# Patient Record
Sex: Female | Born: 1945 | Race: Black or African American | Hispanic: No | Smoking: Never smoker
Health system: Southern US, Community
[De-identification: ages and names within clinical notes are randomized; demographics above are authoritative.]

## PROBLEM LIST (undated history)

## (undated) DIAGNOSIS — I779 Disorder of arteries and arterioles, unspecified: Secondary | ICD-10-CM

## (undated) DIAGNOSIS — I5042 Chronic combined systolic (congestive) and diastolic (congestive) heart failure: Secondary | ICD-10-CM

## (undated) DIAGNOSIS — M545 Low back pain, unspecified: Secondary | ICD-10-CM

## (undated) DIAGNOSIS — I739 Peripheral vascular disease, unspecified: Secondary | ICD-10-CM

## (undated) DIAGNOSIS — Z90710 Acquired absence of both cervix and uterus: Secondary | ICD-10-CM

## (undated) DIAGNOSIS — F419 Anxiety disorder, unspecified: Secondary | ICD-10-CM

## (undated) DIAGNOSIS — N3281 Overactive bladder: Secondary | ICD-10-CM

## (undated) DIAGNOSIS — I708 Atherosclerosis of other arteries: Secondary | ICD-10-CM

## (undated) DIAGNOSIS — N179 Acute kidney failure, unspecified: Secondary | ICD-10-CM

## (undated) DIAGNOSIS — K219 Gastro-esophageal reflux disease without esophagitis: Secondary | ICD-10-CM

## (undated) DIAGNOSIS — E785 Hyperlipidemia, unspecified: Secondary | ICD-10-CM

## (undated) DIAGNOSIS — E669 Obesity, unspecified: Secondary | ICD-10-CM

## (undated) DIAGNOSIS — N184 Chronic kidney disease, stage 4 (severe): Secondary | ICD-10-CM

## (undated) DIAGNOSIS — E119 Type 2 diabetes mellitus without complications: Secondary | ICD-10-CM

## (undated) DIAGNOSIS — D649 Anemia, unspecified: Secondary | ICD-10-CM

## (undated) DIAGNOSIS — I2510 Atherosclerotic heart disease of native coronary artery without angina pectoris: Secondary | ICD-10-CM

## (undated) DIAGNOSIS — I493 Ventricular premature depolarization: Secondary | ICD-10-CM

## (undated) DIAGNOSIS — M1990 Unspecified osteoarthritis, unspecified site: Secondary | ICD-10-CM

## (undated) DIAGNOSIS — I10 Essential (primary) hypertension: Secondary | ICD-10-CM

## (undated) HISTORY — DX: Anxiety disorder, unspecified: F41.9

## (undated) HISTORY — PX: OTHER SURGICAL HISTORY: SHX169

## (undated) HISTORY — PX: ABDOMINAL HYSTERECTOMY: SHX81

## (undated) HISTORY — PX: CERVICAL BIOPSY: SHX590

## (undated) HISTORY — DX: Unspecified osteoarthritis, unspecified site: M19.90

## (undated) HISTORY — PX: CARPAL TUNNEL RELEASE: SHX101

## (undated) HISTORY — DX: Gastro-esophageal reflux disease without esophagitis: K21.9

## (undated) HISTORY — DX: Acquired absence of both cervix and uterus: Z90.710

## (undated) HISTORY — DX: Overactive bladder: N32.81

## (undated) HISTORY — DX: Low back pain, unspecified: M54.50

## (undated) HISTORY — PX: TRIGGER FINGER RELEASE: SHX641

## (undated) HISTORY — PX: BACK SURGERY: SHX140

## (undated) HISTORY — DX: Hyperlipidemia, unspecified: E78.5

## (undated) HISTORY — DX: Low back pain: M54.5

## (undated) HISTORY — DX: Obesity, unspecified: E66.9

---

## 1997-12-04 ENCOUNTER — Observation Stay (HOSPITAL_COMMUNITY): Admission: RE | Admit: 1997-12-04 | Discharge: 1997-12-04 | Payer: Self-pay | Admitting: Cardiology

## 1997-12-24 ENCOUNTER — Ambulatory Visit (HOSPITAL_COMMUNITY): Admission: RE | Admit: 1997-12-24 | Discharge: 1997-12-24 | Payer: Self-pay | Admitting: Neurosurgery

## 1998-01-12 ENCOUNTER — Ambulatory Visit (HOSPITAL_COMMUNITY): Admission: RE | Admit: 1998-01-12 | Discharge: 1998-01-12 | Payer: Self-pay | Admitting: Neurosurgery

## 1998-09-25 ENCOUNTER — Ambulatory Visit (HOSPITAL_COMMUNITY): Admission: RE | Admit: 1998-09-25 | Discharge: 1998-09-25 | Payer: Self-pay | Admitting: Neurosurgery

## 1998-10-08 ENCOUNTER — Inpatient Hospital Stay (HOSPITAL_COMMUNITY): Admission: RE | Admit: 1998-10-08 | Discharge: 1998-10-09 | Payer: Self-pay | Admitting: Neurosurgery

## 1999-08-21 ENCOUNTER — Encounter: Payer: Self-pay | Admitting: Family Medicine

## 1999-08-21 ENCOUNTER — Encounter: Admission: RE | Admit: 1999-08-21 | Discharge: 1999-08-21 | Payer: Self-pay | Admitting: Family Medicine

## 2000-07-05 HISTORY — PX: COLONOSCOPY: SHX174

## 2000-07-19 ENCOUNTER — Ambulatory Visit (HOSPITAL_COMMUNITY): Admission: RE | Admit: 2000-07-19 | Discharge: 2000-07-19 | Payer: Self-pay | Admitting: General Surgery

## 2001-09-05 ENCOUNTER — Ambulatory Visit (HOSPITAL_COMMUNITY): Admission: RE | Admit: 2001-09-05 | Discharge: 2001-09-05 | Payer: Self-pay | Admitting: Family Medicine

## 2001-09-05 ENCOUNTER — Encounter: Payer: Self-pay | Admitting: Family Medicine

## 2002-03-05 ENCOUNTER — Encounter: Payer: Self-pay | Admitting: Internal Medicine

## 2002-03-05 ENCOUNTER — Ambulatory Visit (HOSPITAL_COMMUNITY): Admission: RE | Admit: 2002-03-05 | Discharge: 2002-03-05 | Payer: Self-pay | Admitting: Internal Medicine

## 2002-07-17 ENCOUNTER — Ambulatory Visit (HOSPITAL_COMMUNITY): Admission: RE | Admit: 2002-07-17 | Discharge: 2002-07-17 | Payer: Self-pay | Admitting: Family Medicine

## 2002-07-17 ENCOUNTER — Encounter: Payer: Self-pay | Admitting: Family Medicine

## 2002-10-25 ENCOUNTER — Encounter: Payer: Self-pay | Admitting: Family Medicine

## 2002-10-25 ENCOUNTER — Ambulatory Visit (HOSPITAL_COMMUNITY): Admission: RE | Admit: 2002-10-25 | Discharge: 2002-10-25 | Payer: Self-pay | Admitting: Family Medicine

## 2002-12-23 ENCOUNTER — Encounter: Payer: Self-pay | Admitting: Family Medicine

## 2002-12-23 ENCOUNTER — Ambulatory Visit (HOSPITAL_COMMUNITY): Admission: RE | Admit: 2002-12-23 | Discharge: 2002-12-23 | Payer: Self-pay | Admitting: Family Medicine

## 2003-06-23 ENCOUNTER — Ambulatory Visit (HOSPITAL_COMMUNITY): Admission: RE | Admit: 2003-06-23 | Discharge: 2003-06-23 | Payer: Self-pay | Admitting: Urology

## 2003-07-17 ENCOUNTER — Ambulatory Visit (HOSPITAL_COMMUNITY): Admission: RE | Admit: 2003-07-17 | Discharge: 2003-07-17 | Payer: Self-pay | Admitting: General Surgery

## 2003-11-06 ENCOUNTER — Ambulatory Visit (HOSPITAL_COMMUNITY): Admission: RE | Admit: 2003-11-06 | Discharge: 2003-11-06 | Payer: Self-pay | Admitting: Family Medicine

## 2004-02-25 ENCOUNTER — Ambulatory Visit: Payer: Self-pay | Admitting: Cardiology

## 2004-03-04 ENCOUNTER — Ambulatory Visit (HOSPITAL_COMMUNITY): Admission: RE | Admit: 2004-03-04 | Discharge: 2004-03-04 | Payer: Self-pay | Admitting: Family Medicine

## 2004-03-25 ENCOUNTER — Ambulatory Visit: Payer: Self-pay | Admitting: Orthopedic Surgery

## 2004-05-09 ENCOUNTER — Inpatient Hospital Stay (HOSPITAL_COMMUNITY): Admission: EM | Admit: 2004-05-09 | Discharge: 2004-05-11 | Payer: Self-pay | Admitting: Emergency Medicine

## 2004-05-10 ENCOUNTER — Ambulatory Visit: Payer: Self-pay | Admitting: *Deleted

## 2004-05-12 ENCOUNTER — Ambulatory Visit: Payer: Self-pay | Admitting: Cardiology

## 2004-12-16 ENCOUNTER — Ambulatory Visit: Payer: Self-pay | Admitting: Internal Medicine

## 2004-12-31 ENCOUNTER — Ambulatory Visit (HOSPITAL_COMMUNITY): Admission: RE | Admit: 2004-12-31 | Discharge: 2004-12-31 | Payer: Self-pay | Admitting: Family Medicine

## 2005-02-04 ENCOUNTER — Ambulatory Visit: Payer: Self-pay | Admitting: Cardiology

## 2005-03-24 ENCOUNTER — Emergency Department (HOSPITAL_COMMUNITY): Admission: EM | Admit: 2005-03-24 | Discharge: 2005-03-24 | Payer: Self-pay | Admitting: Emergency Medicine

## 2005-06-17 ENCOUNTER — Encounter: Admission: RE | Admit: 2005-06-17 | Discharge: 2005-06-17 | Payer: Self-pay | Admitting: Family Medicine

## 2005-10-04 ENCOUNTER — Ambulatory Visit: Payer: Self-pay | Admitting: Internal Medicine

## 2005-10-07 ENCOUNTER — Ambulatory Visit (HOSPITAL_COMMUNITY): Admission: RE | Admit: 2005-10-07 | Discharge: 2005-10-07 | Payer: Self-pay | Admitting: Urology

## 2005-12-01 ENCOUNTER — Ambulatory Visit (HOSPITAL_COMMUNITY): Admission: RE | Admit: 2005-12-01 | Discharge: 2005-12-01 | Payer: Self-pay | Admitting: Urology

## 2006-01-02 ENCOUNTER — Ambulatory Visit (HOSPITAL_COMMUNITY): Admission: RE | Admit: 2006-01-02 | Discharge: 2006-01-02 | Payer: Self-pay | Admitting: Family Medicine

## 2006-03-07 HISTORY — PX: COLONOSCOPY: SHX174

## 2006-03-08 ENCOUNTER — Ambulatory Visit: Payer: Self-pay | Admitting: Cardiology

## 2006-03-16 ENCOUNTER — Ambulatory Visit: Payer: Self-pay

## 2006-03-31 ENCOUNTER — Ambulatory Visit: Payer: Self-pay

## 2006-03-31 LAB — CONVERTED CEMR LAB
BUN: 15 mg/dL (ref 6–23)
Creatinine, Ser: 1 mg/dL (ref 0.4–1.2)
GFR calc Af Amer: 72 mL/min
GFR calc non Af Amer: 60 mL/min
HCT: 35.8 % — ABNORMAL LOW (ref 36.0–46.0)
INR: 0.9 (ref 0.9–2.0)
MCHC: 32.7 g/dL (ref 30.0–36.0)
MCV: 100.8 fL — ABNORMAL HIGH (ref 78.0–100.0)
RBC: 3.55 M/uL — ABNORMAL LOW (ref 3.87–5.11)
RDW: 13 % (ref 11.5–14.6)
Sodium: 140 meq/L (ref 135–145)
WBC: 5.1 10*3/uL (ref 4.5–10.5)
aPTT: 30.1 s (ref 26.5–36.5)

## 2006-04-01 ENCOUNTER — Encounter: Admission: RE | Admit: 2006-04-01 | Discharge: 2006-04-01 | Payer: Self-pay | Admitting: Family Medicine

## 2006-04-04 ENCOUNTER — Ambulatory Visit: Payer: Self-pay | Admitting: Cardiology

## 2006-04-04 ENCOUNTER — Inpatient Hospital Stay (HOSPITAL_BASED_OUTPATIENT_CLINIC_OR_DEPARTMENT_OTHER): Admission: RE | Admit: 2006-04-04 | Discharge: 2006-04-04 | Payer: Self-pay | Admitting: Cardiology

## 2006-04-04 HISTORY — PX: CARDIAC CATHETERIZATION: SHX172

## 2006-04-11 ENCOUNTER — Ambulatory Visit (HOSPITAL_COMMUNITY): Admission: RE | Admit: 2006-04-11 | Discharge: 2006-04-12 | Payer: Self-pay | Admitting: Cardiology

## 2006-04-11 ENCOUNTER — Encounter: Payer: Self-pay | Admitting: Internal Medicine

## 2006-04-11 ENCOUNTER — Ambulatory Visit: Payer: Self-pay | Admitting: Cardiology

## 2006-04-11 DIAGNOSIS — K5900 Constipation, unspecified: Secondary | ICD-10-CM

## 2006-04-11 DIAGNOSIS — M1990 Unspecified osteoarthritis, unspecified site: Secondary | ICD-10-CM | POA: Insufficient documentation

## 2006-04-11 DIAGNOSIS — N318 Other neuromuscular dysfunction of bladder: Secondary | ICD-10-CM | POA: Insufficient documentation

## 2006-04-11 DIAGNOSIS — K219 Gastro-esophageal reflux disease without esophagitis: Secondary | ICD-10-CM | POA: Insufficient documentation

## 2006-04-11 DIAGNOSIS — E119 Type 2 diabetes mellitus without complications: Secondary | ICD-10-CM

## 2006-04-11 DIAGNOSIS — I10 Essential (primary) hypertension: Secondary | ICD-10-CM

## 2006-04-11 DIAGNOSIS — J309 Allergic rhinitis, unspecified: Secondary | ICD-10-CM | POA: Insufficient documentation

## 2006-04-11 DIAGNOSIS — G5600 Carpal tunnel syndrome, unspecified upper limb: Secondary | ICD-10-CM | POA: Insufficient documentation

## 2006-04-11 DIAGNOSIS — F411 Generalized anxiety disorder: Secondary | ICD-10-CM

## 2006-04-11 DIAGNOSIS — M545 Low back pain: Secondary | ICD-10-CM

## 2006-04-11 DIAGNOSIS — E785 Hyperlipidemia, unspecified: Secondary | ICD-10-CM

## 2006-04-11 HISTORY — PX: CORONARY STENT PLACEMENT: SHX1402

## 2006-04-25 ENCOUNTER — Ambulatory Visit: Payer: Self-pay | Admitting: Cardiology

## 2006-06-20 ENCOUNTER — Ambulatory Visit: Payer: Self-pay | Admitting: Cardiology

## 2006-10-06 ENCOUNTER — Ambulatory Visit: Payer: Self-pay | Admitting: Cardiology

## 2006-10-12 ENCOUNTER — Ambulatory Visit: Payer: Self-pay

## 2006-11-29 ENCOUNTER — Ambulatory Visit: Payer: Self-pay | Admitting: Orthopedic Surgery

## 2007-01-11 ENCOUNTER — Ambulatory Visit (HOSPITAL_COMMUNITY): Admission: RE | Admit: 2007-01-11 | Discharge: 2007-01-11 | Payer: Self-pay | Admitting: Family Medicine

## 2007-01-24 ENCOUNTER — Encounter: Payer: Self-pay | Admitting: Orthopedic Surgery

## 2007-02-05 ENCOUNTER — Ambulatory Visit: Payer: Self-pay | Admitting: Orthopedic Surgery

## 2007-02-05 DIAGNOSIS — M25519 Pain in unspecified shoulder: Secondary | ICD-10-CM | POA: Insufficient documentation

## 2007-04-12 ENCOUNTER — Ambulatory Visit: Payer: Self-pay | Admitting: Cardiology

## 2007-08-23 ENCOUNTER — Emergency Department (HOSPITAL_COMMUNITY): Admission: EM | Admit: 2007-08-23 | Discharge: 2007-08-23 | Payer: Self-pay | Admitting: Emergency Medicine

## 2007-09-19 ENCOUNTER — Ambulatory Visit: Payer: Self-pay | Admitting: Cardiology

## 2007-09-21 ENCOUNTER — Ambulatory Visit (HOSPITAL_COMMUNITY): Admission: RE | Admit: 2007-09-21 | Discharge: 2007-09-21 | Payer: Self-pay | Admitting: Cardiology

## 2007-09-21 ENCOUNTER — Ambulatory Visit: Payer: Self-pay | Admitting: Internal Medicine

## 2007-09-21 ENCOUNTER — Encounter: Payer: Self-pay | Admitting: Cardiology

## 2008-04-10 ENCOUNTER — Ambulatory Visit: Payer: Self-pay | Admitting: Cardiology

## 2008-06-14 DIAGNOSIS — R011 Cardiac murmur, unspecified: Secondary | ICD-10-CM

## 2008-10-08 ENCOUNTER — Ambulatory Visit: Payer: Self-pay | Admitting: Cardiology

## 2008-10-08 DIAGNOSIS — R079 Chest pain, unspecified: Secondary | ICD-10-CM | POA: Insufficient documentation

## 2008-10-08 DIAGNOSIS — I2510 Atherosclerotic heart disease of native coronary artery without angina pectoris: Secondary | ICD-10-CM | POA: Insufficient documentation

## 2008-10-13 ENCOUNTER — Telehealth (INDEPENDENT_AMBULATORY_CARE_PROVIDER_SITE_OTHER): Payer: Self-pay | Admitting: *Deleted

## 2008-10-14 ENCOUNTER — Encounter: Payer: Self-pay | Admitting: Cardiology

## 2008-10-14 ENCOUNTER — Ambulatory Visit: Payer: Self-pay

## 2009-07-20 ENCOUNTER — Ambulatory Visit: Payer: Self-pay | Admitting: Cardiology

## 2009-07-20 DIAGNOSIS — E663 Overweight: Secondary | ICD-10-CM

## 2009-08-14 ENCOUNTER — Telehealth (INDEPENDENT_AMBULATORY_CARE_PROVIDER_SITE_OTHER): Payer: Self-pay | Admitting: *Deleted

## 2009-10-08 ENCOUNTER — Telehealth: Payer: Self-pay | Admitting: Cardiology

## 2009-11-05 ENCOUNTER — Telehealth: Payer: Self-pay | Admitting: Cardiology

## 2009-11-30 ENCOUNTER — Telehealth: Payer: Self-pay | Admitting: Cardiology

## 2010-01-07 ENCOUNTER — Ambulatory Visit: Payer: Self-pay | Admitting: Cardiology

## 2010-01-07 ENCOUNTER — Encounter: Payer: Self-pay | Admitting: Cardiology

## 2010-02-22 ENCOUNTER — Telehealth: Payer: Self-pay | Admitting: Cardiology

## 2010-03-28 ENCOUNTER — Encounter: Payer: Self-pay | Admitting: Family Medicine

## 2010-04-08 NOTE — Assessment & Plan Note (Signed)
Summary: 6 mo f/u ./cy   Visit Type:  Follow-up Primary Provider:  Mirna Mires  CC:  no complaints.  History of Present Illness: [name] returns today for evaluation and management of her coronary disease. She is having no chest pain or angina. She denies any orthopnea, PND or edema. Her weight has been stable. She is following with Dr. Loleta Chance and check her blood work etc. She is having a hard time affording Plavix. Tolerate those generic next year.  Current Medications (verified): 1)  Apidra 100 Unit/ml Soln (Insulin Glulisine) .... Use As Directed 2)  Lantus 100 Unit/ml Soln (Insulin Glargine) .... Use As Directed 3)  Demadex 20 Mg Tabs (Torsemide) .... Two Times A Day 4)  Metformin Hcl 1000 Mg Tabs (Metformin Hcl) .Marland Kitchen.. 1 Tab Two Times A Day 5)  Calan Sr 240 Mg Tbcr (Verapamil Hcl) .... Two Times A Day 6)  Reglan 10 Mg Tabs (Metoclopramide Hcl) .... Once Daily As Needed 7)  Aspirin 325 Mg Tabs (Aspirin) .... Once Daily 8)  Crestor 20 Mg Tabs (Rosuvastatin Calcium) .Marland Kitchen.. 1 Tab At Bedtime 9)  Tribenzor 40-10-25 Mg Tabs (Olmesartan-Amlodipine-Hctz) .Marland Kitchen.. 1 Tab Once Daily 10)  Alprazolam 0.5 Mg Tabs (Alprazolam) .Marland Kitchen.. 1 Tab At Bedtime As Needed 11)  Omega Q Plus .... 2 Softgels Once Daily 12)  Plavix 75 Mg Tabs (Clopidogrel Bisulfate) .Marland Kitchen.. 1 Tab Once Daily 13)  Oxybutynin Chloride 5 Mg Tabs (Oxybutynin Chloride) .Marland Kitchen.. 1 Tab Once Daily  Allergies: 1)  ! * Cdn 2)  ! 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 Cervical lymph node biopsies. Multiple surgeries to her back, involving intravertebral discs. two Taxus stents to the circumflex placed in February 2008  Family History: Last updated: 06/14/2008 Family History of Cancer:  Family History of CVA or Stroke:  Family History of Diabetes:  Family History of Hypertension:   Social History: Last updated: 06/14/2008 Married  Tobacco Use - No.  Alcohol Use - no  Risk Factors: Smoking Status: never (06/14/2008)  Clinical Reports Reviewed:  Cardiac Cath:  04/11/2006: Cardiac Cath Findings:  RESULTS:  Initially stenosis in the mid-circumflex artery was estimated  at 95% and extended over a long area in the end of the proximal and the  mid-vessel and into the mid to distal vessel.  Following stenting, this  improved to 0%.   CONCLUSION:  Successful PCI of a long, diffusely diseased segment in the  mid-circumflex artery using two overlapping Taxus drug-eluting stents  with improvement in the stenosis narrowing from 95% to 0%.   DISPOSITION:  The patient to return to the __________  for further  observation.  Bruce Elvera Lennox Juanda Chance, MD, [facility]  04/04/2006: Cardiac Cath Findings:    CONCLUSION:  Coronary artery disease with diffuse disease in the left  anterior descending with 40% proximal, 70% mid and 80% distal stenosis  and 80% stenosis in the first diagonal branch, 90 and 80% stenosis in  the proximal to mid circumflex artery and 40% narrowing in the mid right  coronary was normal left ventricular function.   RECOMMENDATIONS:  The patient is asymptomatic but has an abnormal  Myoview scan.  The  LAD lesions do not appear to be critical and the  distal lesions appear to be more pronounced than the proximal lesions.  The circumflex lesion is quite tight.  I think the best option would be  intervention on the circumflex artery.  I discussed  this with Dr. Daleen Squibb.  The LAD has diffuse disease  but not critically  diseased and the right coronary artery is free of major obstruction.  Will discuss these options with the patient and plan intervention later  this week or next week if she is agreeable.   Bruce Elvera Lennox Juanda Chance, MD, [facility]  Nuclear Study:  10/14/2008:  Excerise capacity: Adenosine study with no exercise  ECG impression: Baseline: NSR with diffuse ST-T wave abnormalities; No significant ST change with adenosine  Overall impression: Low risk nuclear study.  Overall Impression Comments: Threre is decreased perfusion in the anterolateral Taber Sweetser on both the rest and stress images which is mildly worse on the stress images. I suspect this breast attenuation but can't exclude previous infarct with mild ischemia.  Arvilla Meres, MD, [facility]   10/12/2006:  Exercise capacity -  Blood Pressure -   Clinical Symptoms - Mild chest pain/ dyspnea  ECG impression - nondiagnostic due to baseline changes  Overall impression -  Probable normal perfusion and mild soft tissue attenuation (breast)  03/16/2006:  Exercise capacity - Adenosine study with no exercise  Blood Pressure - Normal BP response  Clinical Symptoms - chest tight  ECG impression - No significant ST segment change suggestive of ischemia  Overall impression -  Abnormal stress nuclear study. There is mild/moderate ischemia in the anterior Cleavon Goldman. This may be somewhat more marked than 2005   Review of Systems       negative other than history of present illness  Vital Signs:  Patient profile:   65 year old female Height:      70 inches Weight:      206 pounds Pulse rate:   72 / minute Pulse rhythm:   regular BP sitting:   150 / 70  (right arm)  Vitals Entered By: [name], CMA (January 07, 2010 9:57 AM)  Physical Exam  General:  obese.  no acute distress Head:  normocephalic and atraumatic Eyes:  PERRLA/EOM intact; conjunctiva and lids normal. Neck:  Neck supple, no JVD. No masses, thyromegaly or  abnormal cervical nodes. Chest Azir Muzyka:  no deformities or breast masses noted Lungs:  Clear bilaterally to auscultation and percussion. Heart:  PMI heart to appreciate, normal S1-S2, soft systolic murmur, no carotid bruit Msk:  decreased ROM.   Pulses:  pulses normal in all 4 extremities Extremities:  No clubbing or cyanosis. Neurologic:  Alert and oriented x 3. Skin:  Intact without lesions or rashes. Psych:  Normal affect.   Impression & Recommendations:  Problem # 1:  CORONARY ATHEROSCLEROSIS NATIVE CORONARY ARTERY (ICD-414.01) Assessment Unchanged  Her updated medication list for this problem includes:    Calan Sr 240 Mg Tbcr (Verapamil hcl) .Marland Kitchen..Marland Kitchen Two times a day    Aspirin 325 Mg Tabs (Aspirin) ..... Once daily    Tribenzor 40-10-25 Mg Tabs (Olmesartan-amlodipine-hctz) .Marland Kitchen... 1 tab once daily    Plavix 75 Mg Tabs (Clopidogrel bisulfate) .Marland Kitchen... 1 tab once daily  Problem # 2:  OVERWEIGHT/OBESITY (ICD-278.02) Assessment: Unchanged  Problem # 3:  HEART MURMUR, SYSTOLIC (ICD-785.2) Assessment: Unchanged  Her updated medication list for this problem includes:    Demadex 20 Mg Tabs (Torsemide) .Marland Kitchen..Marland Kitchen Two times a day  Tribenzor 40-10-25 Mg Tabs (Olmesartan-amlodipine-hctz) .Marland Kitchen... 1 tab once daily  Orders: EKG w/ Interpretation (93000)  Problem # 4:  HYPERTENSION (ICD-401.9) Assessment: Unchanged  Her updated medication list for this problem includes:    Demadex 20 Mg Tabs (Torsemide) .Marland Kitchen..Marland Kitchen Two times a day    Calan Sr 240 Mg Tbcr (Verapamil hcl) .Marland Kitchen..Marland Kitchen Two times a day    Aspirin 325 Mg Tabs (Aspirin) ..... Once daily    Tribenzor 40-10-25 Mg Tabs (Olmesartan-amlodipine-hctz) .Marland Kitchen... 1 tab once daily  Problem # 5:  HYPERLIPIDEMIA (ICD-272.4) followed by primary care Her updated medication list for this problem includes:    Crestor 20 Mg Tabs (Rosuvastatin calcium) .Marland Kitchen... 1 tab at bedtime  Problem # 6:  DIABETES MELLITUS, TYPE II (ICD-250.00) followed by primary care The  following medications were removed from the medication list:    Actos 15 Mg Tabs (Pioglitazone hcl) .Marland Kitchen... 1 tab once daily    Byetta 5 Mcg Pen 5 Mcg/0.67ml Soln (Exenatide) .Marland Kitchen... Take as directed Her updated medication list for this problem includes:    Apidra 100 Unit/ml Soln (Insulin glulisine) ..... Use as directed    Lantus 100 Unit/ml Soln (Insulin glargine) ..... Use as directed    Metformin Hcl 1000 Mg Tabs (Metformin hcl) .Marland Kitchen... 1 tab two times a day    Aspirin 325 Mg Tabs (Aspirin) ..... Once daily    Tribenzor 40-10-25 Mg Tabs (Olmesartan-amlodipine-hctz) .Marland Kitchen... 1 tab once daily  Patient Instructions: 1)  Your physician recommends that you schedule a follow-up appointment in: 1 year with DR. Yancy Hascall 2)  Your physician recommends that you continue on your current medications as directed. Please refer to the Current Medication list given to you today.

## 2010-04-08 NOTE — Progress Notes (Signed)
Summary: Samples of Plavix  Phone Note Call from Patient Call back at Home Phone [phone]   Caller: Patient Summary of Call: Pt need samples of Plavix Initial call taken by: Judie Grieve,  October 08, 2009 1:45 PM  Follow-up for Phone Call        Samples pulled and placed at front desk. I have left a message for the pt to call. Plavix 75mg  [id number]  exp: 9/12 # 8. Sherri Rad, RN, BSN  October 08, 2009 2:05 PM   The pt. is aware. Follow-up by: Sherri Rad, RN, BSN,  October 08, 2009 4:16 PM

## 2010-04-08 NOTE — Progress Notes (Signed)
Summary: pt needs samples of plavix  Phone Note Refill Request Call back at Home Phone [phone] Call back at [phone] Message from:  Patient on pt needs samples  Refills Requested: Medication #1:  PLAVIX 75 MG TABS 1 tab once daily pt needs samples of Plavix  Initial call taken by: Omer Jack,  November 05, 2009 10:56 AM  Follow-up for Phone Call        CMA s/w family member of pt and advised Plavix samples are at the front desk and office is open M-F 8-5 however this Monday we will be closed due to the holiday. Family member gave verbal understanding. Danielle Rankin, CMA  November 05, 2009 12:52 PM

## 2010-04-08 NOTE — Progress Notes (Signed)
Summary: plavix samples  Phone Note Call from Patient   Caller: Patient Call For: samples Details for Reason: wants samples Summary of Call: pt called from parking lot wanting samples of Plavix.  75 mg tablet #8 Lot [id number]  exp 9/12  given to pt Initial call taken by: Charolotte Capuchin, RN,  August 14, 2009 1:31 PM

## 2010-04-08 NOTE — Progress Notes (Signed)
Summary: Need samples of Plavix  Phone Note Call from Patient Call back at [phone]   Caller: Patient Summary of Call: Pt need samples of Plavix Initial call taken by: Judie Grieve,  February 22, 2010 12:20 PM  Follow-up for Phone Call        Phone Call Completed PT AWARE NO PLAVIX SAMPLES AVAILABLE. Follow-up by: Scherrie Bateman, LPN,  February 22, 2010 3:09 PM

## 2010-04-08 NOTE — Assessment & Plan Note (Signed)
Summary: f64m  Medications Added REGLAN 10 MG TABS (METOCLOPRAMIDE HCL) once daily as needed TRIBENZOR 40-10-25 MG TABS (OLMESARTAN-AMLODIPINE-HCTZ) 1 tab once daily ACTOS 15 MG TABS (PIOGLITAZONE HCL) 1 tab once daily BYETTA 5 MCG PEN 5 MCG/0.02ML SOLN (EXENATIDE) take as directed OXYBUTYNIN CHLORIDE 5 MG TABS (OXYBUTYNIN CHLORIDE) 1 tab once daily        Visit Type:  6 mo f/u Primary Provider:  Mirna Mires  CC:  pt states she stays tired all the time....pt weight is up 10 lb since 10/2008.  History of Present Illness: [name] comes in today for followup of her coronary artery disease.  She was recently switched to Tribenzor which is really helped her pressure. He's also obtaining it for $45 a month.  Her blood pressure she says always checks well in Dr. Adaline Sill office. She is always low higher office it is 158 systolic today.  She is having no angina. She has occasional palpitations. She seems to be very compliant with her medications.  Recent stress nuclear study was stable in August of 2010. She has normal left ventricular systolic function. Please refer to the report.  Current Medications (verified): 1)  Apidra 100 Unit/ml Soln (Insulin Glulisine) .... Use As Directed 2)  Lantus 100 Unit/ml Soln (Insulin Glargine) .... Use As Directed 3)  Demadex 20 Mg Tabs (Torsemide) .... Two Times A Day 4)  Metformin Hcl 1000 Mg Tabs (Metformin Hcl) .Marland Kitchen.. 1 Tab Two Times A Day 5)  Calan Sr 240 Mg Tbcr (Verapamil Hcl) .... Two Times A Day 6)  Reglan 10 Mg Tabs (Metoclopramide Hcl) .... Once Daily As Needed 7)  Aspirin 325 Mg Tabs (Aspirin) .... Once Daily 8)  Crestor 20 Mg Tabs (Rosuvastatin Calcium) .Marland Kitchen.. 1 Tab At Bedtime 9)  Tribenzor 40-10-25 Mg Tabs (Olmesartan-Amlodipine-Hctz) .Marland Kitchen.. 1 Tab Once Daily 10)  Alprazolam 0.5 Mg Tabs (Alprazolam) .Marland Kitchen.. 1 Tab At Bedtime As Needed 11)  Omega Q Plus .... 2 Softgels Once Daily 12)  Plavix 75 Mg Tabs (Clopidogrel Bisulfate) .Marland Kitchen.. 1 Tab Once  Daily 13)  Actos 15 Mg Tabs (Pioglitazone Hcl) .Marland Kitchen.. 1 Tab Once Daily 14)  Byetta 5 Mcg Pen 5 Mcg/0.75ml Soln (Exenatide) .... Take As Directed 15)  Oxybutynin Chloride 5 Mg Tabs (Oxybutynin Chloride) .Marland Kitchen.. 1 Tab Once Daily  Allergies: 1)  ! * Cdn 2)  ! 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 Cervical lymph node biopsies. Multiple surgeries to her back, involving intravertebral discs. two Taxus stents to the circumflex placed in February 2008  Family History: Last updated: 06/14/2008 Family History of Cancer:  Family History of CVA or Stroke:  Family History of Diabetes:  Family History of Hypertension:   Social History: Last updated: 06/14/2008 Married  Tobacco Use - No.  Alcohol Use - no  Risk Factors: Smoking Status: never (06/14/2008)  Review of Systems       negative other than history of present illness  Vital Signs:  Patient profile:   65 year old female Height:      71 inches Weight:      205 pounds BMI:     28.70 Pulse rate:   72 /  minute Pulse rhythm:   regular BP sitting:   154 / 80  (left arm) Cuff size:   large  Vitals Entered By: Danielle Rankin, CMA (Jul 20, 2009 10:29 AM)  Physical Exam  General:  obese.   Head:  normocephalic and atraumatic Eyes:  PERRLA/EOM intact; conjunctiva and lids normal. Neck:  Neck supple, no JVD. No masses, thyromegaly or abnormal cervical nodes. Lungs:  Clear bilaterally to auscultation and  percussion. Heart:  Non-displaced PMI, chest non-tender; regular rate and rhythm, S1, S2 without murmurs, rubs or gallops. Carotid upstroke normal, no bruit. Normal abdominal aortic size, no bruits. Femorals normal pulses, no bruits. Pedals normal pulses. No edema, no varicosities. Msk:  Back normal, normal gait. Muscle strength and tone normal. Pulses:  pulses normal in all 4 extremities Extremities:  No clubbing or cyanosis. Neurologic:  Alert and oriented x 3. Skin:  Intact without lesions or rashes. Psych:  Normal affect.   Problems:  Medical Problems Added: 1)  Dx of Overweight/obesity  (ICD-278.02)  Impression & Recommendations:  Problem # 1:  CORONARY ATHEROSCLEROSIS NATIVE CORONARY ARTERY (ICD-414.01)  Her updated medication list for this problem includes:    Calan Sr 240 Mg Tbcr (Verapamil hcl) .Marland Kitchen..Marland Kitchen Two times a day    Aspirin 325 Mg Tabs (Aspirin) ..... Once daily    Tribenzor 40-10-25 Mg Tabs (Olmesartan-amlodipine-hctz) .Marland Kitchen... 1 tab once daily    Plavix 75 Mg Tabs (Clopidogrel bisulfate) .Marland Kitchen... 1 tab once daily  Problem # 2:  CHEST PAIN UNSPECIFIED (ICD-786.50) Assessment: Improved  Her updated medication list for this problem includes:    Calan Sr 240 Mg Tbcr (Verapamil hcl) .Marland Kitchen..Marland Kitchen Two times a day    Aspirin 325 Mg Tabs (Aspirin) ..... Once daily    Tribenzor 40-10-25 Mg Tabs (Olmesartan-amlodipine-hctz) .Marland Kitchen... 1 tab once daily    Plavix 75 Mg Tabs (Clopidogrel bisulfate) .Marland Kitchen... 1 tab once daily  Problem # 3:  HEART MURMUR, SYSTOLIC (ICD-785.2) Assessment: Unchanged  Her updated medication list for this problem includes:    Demadex 20 Mg Tabs (Torsemide) .Marland Kitchen..Marland Kitchen Two times a day    Tribenzor 40-10-25 Mg Tabs (Olmesartan-amlodipine-hctz) .Marland Kitchen... 1 tab once daily  Problem # 4:  HYPERTENSION (ICD-401.9) Assessment: Unchanged  Her updated medication list for this problem includes:    Demadex 20 Mg Tabs (Torsemide) .Marland Kitchen..Marland Kitchen Two times a day    Calan Sr 240 Mg Tbcr (Verapamil  hcl) .Marland Kitchen..Marland Kitchen Two times a day    Aspirin 325 Mg Tabs (Aspirin) ..... Once daily    Tribenzor 40-10-25 Mg Tabs (Olmesartan-amlodipine-hctz) .Marland Kitchen... 1 tab once daily  Problem # 5:  HYPERLIPIDEMIA (ICD-272.4)  Her updated medication list for this problem includes:    Crestor 20 Mg Tabs (Rosuvastatin calcium) .Marland Kitchen... 1 tab at bedtime  Problem # 6:  DIABETES MELLITUS, TYPE II (ICD-250.00)  Her updated medication list for this problem includes:    Apidra 100 Unit/ml Soln (Insulin glulisine) ..... Use as directed    Lantus 100 Unit/ml Soln (Insulin glargine) ..... Use as directed    Metformin Hcl 1000 Mg Tabs (Metformin hcl) .Marland Kitchen... 1 tab two times a day    Aspirin 325 Mg Tabs (Aspirin) ..... Once daily    Tribenzor 40-10-25 Mg Tabs (Olmesartan-amlodipine-hctz) .Marland Kitchen... 1 tab once daily    Actos 15 Mg Tabs (Pioglitazone hcl) .Marland Kitchen... 1 tab once daily    Byetta 5 Mcg Pen 5 Mcg/0.74ml Soln (Exenatide) .Marland Kitchen... Take as directed  Problem # 7:  OVERWEIGHT/OBESITY (ICD-278.02) Assessment: Unchanged  Patient Instructions: 1)  Your physician recommends that you schedule a follow-up appointment in: 6 MONTHS  WITH DR Vergene Marland 2)  Your physician recommends that you continue on your current medications as directed. Please refer to the Current Medication list given to you today.

## 2010-04-08 NOTE — Progress Notes (Signed)
Summary: samples plavix front desk..pt aware  Phone Note Call from Patient Call back at Home Phone [phone]   Caller: Patient Reason for Call: Talk to Nurse Summary of Call: sample of plavix.  Initial call taken by: Lorne Skeens,  November 30, 2009 2:19 PM  Follow-up for Phone Call        CMA s/w pt and advised that 2 boxes of plavix would be at the front desk for her. When asked if she needed a new rx called in she stated no that she had a prescription already. Danielle Rankin, CMA  November 30, 2009 3:03 PM

## 2010-07-20 NOTE — Assessment & Plan Note (Signed)
[facility]                            CARDIOLOGY OFFICE NOTE   NAME:[name], [name]                   MRN:          [id number]  DATE:10/06/2006                            DOB:          Jul 19, 1945    [name] returns today for further management of her coronary artery  disease.  She is status post 2 TAXUS stents to the circumflex in  February of 2008 because of a positive stress Myoview showing anterior  lateral wall ischemia.  She was totally asymptomatic.  She is diabetic.   She continues to have chronic fatigue.  Other than that, she is doing  well.   CURRENT MEDICATIONS:  Unchanged since her last visit, except that she is  now on Lescol 80 mg 1 nightly instead of Lipitor.  Dr. Loleta Chance is following  her blood work as well as her diabetes.   EXAM:  Blood pressure is 160/70, pulse 63 and regular, weight 203, down  9 pounds.  HEENT:  Normocephalic, atraumatic.  PERRLA.  Extraocular movements are  intact.  Sclerae clear.  Facial symmetry is normal.  Carotids are full.  There is no JVD.  Thyroid is not enlarged.  Trachea  is midline.  LUNGS:  Clear.  HEART:  Reveals a poorly appreciated PMI.  Normal S1, S2.  ABDOMEN:  Soft with good bowel sounds.  Obesity precludes accurate  assessment of organomegaly.  EXTREMITIES:  No cyanosis, clubbing, or edema.  Pulses are intact.  NEURO:  Intact.   ASSESSMENT AND PLAN:  [name] is doing well.  We will arrange for an  adenosine Myoview to follow up on her silent ischemia status post TAXUS  stents to the circumflex.  She has small vessel disease otherwise.   Assuming this is stable, we will see her back in 6 months.     Thomas C. Daleen Squibb, MD, [facility]  Electronically Signed    TCW/MedQ  DD: 10/06/2006  DT: 10/06/2006  Job #: [id number]   cc:   Evlyn Courier, MD

## 2010-07-20 NOTE — Assessment & Plan Note (Signed)
[facility]                            CARDIOLOGY OFFICE NOTE   NAME:[name], [name]                   MRN:          [id number]  DATE:09/19/2007                            DOB:          19-Aug-1945    [name] comes in today because of a heart murmur.  Dr. Loleta Chance saw her  recently and heard this.   She has coronary artery disease and has had 2 Taxus stent from the  circumflex, placed in February 2008.  She had a followup stress Myoview  in October 12, 2006, which showed no ischemia with normal left ventricular  systolic function.   She is having no angina.   She struggles with her blood pressure and her weight.   I have really not heard a murmur in the past per my notes.   Her medications include,  1. Aspirin 325 mg a day.  2. Plavix 75 mg a day.  3. Verapamil 240 mg p.o. b.i.d.  4. Allegra 180 mg p.o. daily.  5. Exforge 160/5 daily.  6. Omega-3 t.i.d.  7. Multivitamin.  8. Metformin 5 mg p.o. b.i.d.  9. Lantus as directed.  10.Crestor 20 mg nightly.  11.Demadex 40 mg a day.   PHYSICAL EXAMINATION:  GENERAL:  She is very pleasant.  VITAL SIGNS:  Her blood pressure is high as always is in the office.  It  is 170/83.  Her pulse 69 and regular.  Weight is 206 and stable.  HEENT:  Unchanged.  NECK:  Carotid upstrokes are equal bilaterally without bruits.  No JVD.  Thyroid is not enlarged.  Trachea is midline.  LUNGS:  Clear.  HEART:  There was a very soft systolic murmur.  S2 split.  I could hear  no diastolic component.  There is no gallop.  There is no rub.  ABDOMEN:  Soft.  Good bowel sounds.  No midline bruits.  EXTREMITIES:  There is no cyanosis or clubbing, and only 1+ edema.  Pulses are intact.  NEURO:  Intact.   [name] has a systolic murmur, which may just be a flow murmur.  We  will obtain a 2-D echocardiogram to delineate.  Assuming this is  unremarkable, I will see her back again in 6 months.      Thomas C. Daleen Squibb,  MD, [facility]  Electronically Signed    TCW/MedQ  DD: 09/19/2007  DT: 09/20/2007  Job #: [id number]   cc:   Annia Friendly. Loleta Chance, MD

## 2010-07-20 NOTE — Assessment & Plan Note (Signed)
[facility]                            CARDIOLOGY OFFICE NOTE   NAME:[name], [name]                   MRN:          [id number]  DATE:04/12/2007                            DOB:          Dec 08, 1945    [name] returns today.  She looks the best I have seen her.  She is  having no angina or chest discomfort.  Her stress Myoview following  deployment of 2 Taxus stents in the circumflex was remarkably good,  October 12, 2006.  She had an ejection fraction of 72% with no ischemia.   She has kept her weight down; in fact, she has lost 9 pounds since I  last her.  Her blood pressure is excellent a day, which is remarkable,  at 134/68.   Her medications are unchanged since her last visit, except she is now on  Crestor 20 mg a day; this is being followed by [name].   PHYSICAL EXAMINATION:  Today, her blood pressure is 134/68, her pulse 63  and regular.  Weight is 209.  HEENT:  Normocephalic, atraumatic.  PERRL.  Extraocular movements  intact.  Sclerae are clear.  Facial symmetry is normal.  Carotids were  equal bilaterally without bruits.  Thyroid is not enlarged.  Trachea is  midline.  LUNGS:  Clear.  HEART:  Reveals a regular rate and rhythm.  No gallop.  ABDOMEN:  Soft, good bowel sounds.  EXTREMITIES:  Reveal no cyanosis, clubbing or edema.  Pulses are intact.  NEUROLOGIC:  Exam is intact.   EKG:  Shows sinus rhythm with T-wave changes inferolaterally, which are  stable.   ASSESSMENT AND PLAN:  [name] is doing remarkably well.  I am  delighted with her blood pressure and her weight loss.  I am also please  that she is not having any angina.  I will plan on seeing her back again  in 6 months.     Thomas C. Daleen Squibb, MD, [facility]  Electronically Signed    TCW/MedQ  DD: 04/12/2007  DT: 04/13/2007  Job #: [id number]   cc:   [name]. Loleta Chance, MD

## 2010-07-20 NOTE — Assessment & Plan Note (Signed)
[facility]                            CARDIOLOGY OFFICE NOTE   NAME:Quinton, RAYVON BRANDVOLD                   MRN:          [id number]  DATE:04/10/2008                            DOB:          02/25/1946    Ms. Melchor comes in today for followup.  She really has no complaints  except occasional sharp pain under her right breast.  Dr. Loleta Chance has been  treating this with what sounds like a muscle relaxant or anti-  inflammatory.   She has no angina, but has had a history of a positive stress Myoview  showing ischemia with no angina past.  This result in two Taxus stents  to the circumflex placed in February 2008.  Her last stress Myoview  after this showed no ischemia.  She has normal left ventricular systolic  function.   Her blood pressures had been better and her weight is actually fairly  stable.   MEDICATIONS:  1. Torsemide 20 mg p.o. t.i.d.  2. Calcium.  3. Vitamin D.  4. Omeprazole 20 mg nightly.  5. Aspirin 325 mg a day.  6. Plavix 75 mg a day.  7. Verapamil 240 mg p.o. b.i.d.  8. Exforge 160/5 daily.  9. Multivitamin once in a while.  10.Metformin 500 b.i.d.  11.Lantus as directed.  12.Crestor 20 mg a day.  13.She takes Xanax p.r.n.  14.Nitroglycerin.   PHYSICAL EXAMINATION:  VITAL SIGNS:  Her blood pressure today is 154/72  which is pretty good for her.  Her heart rate is 65 and regular.  Weight  is 206 and stable.  Her EKG is remarkable for inferior lateral changes  which are stable.  HEENT:  Normal.  Carotid upstrokes were equal bilaterally without  bruits, no JVD.  Thyroid is not enlarged.  Trachea is midline.  LUNGS:  Clear to auscultation and percussion.  HEART:  Nondisplaced PMI.  Normal S1, S2.  ABDOMEN:  Soft, good bowel sounds.  EXTREMITIES:  No edema.  Pulses are intact.  NEUROLOGIC:  Intact.   ASSESSMENT:  Ms. Hearns is doing well.  She will need objective  assessment of her coronary disease this summer 2 years out  from her last  stress Myoview.  This is particularly important in  light the fact she has a The defective warning system.  I have made no  change in medical program.  I will see her back at that time.     Thomas C. Daleen Squibb, MD, [facility]  Electronically Signed    TCW/MedQ  DD: 04/10/2008  DT: 04/11/2008  Job #: [id number]   cc:   Annia Friendly. Loleta Chance, MD

## 2010-07-20 NOTE — Assessment & Plan Note (Signed)
[facility]                                 ON-CALL NOTE   NAME:[name], [name]                   MRN:          [id number]  DATE:12/19/2006                            DOB:          [birth year]    ATTENDING CARDIOLOGIST:  Dr. Lewayne Bunting.   PRIMARY CARE PHYSICIAN:  Dr. Mirna Mires   I received a phone call from [name] complaining of pain under her  left breast with movement radiating into her back.  The patient has a  history of coronary artery disease with 2 Taxus stents in the circumflex  in February of 2008 because of a positive stress Myoview.  She is a  patient of Dr. Juanito Doom.   The patient states that she has been recently diagnosed with bursitis  and tendinitis in her shoulder and arm, and has been undergoing physical  therapy for this, and has also been using pain medication as needed,  although she has not taken any today.  She states that she feels a sharp  pain moving from her left breast to her back with movement.  The patient  states that during physical therapy she occasionally feels this pain as  well.   I have advised the patient that this does not appear to be cardiac in  origin, as it occurs with movement, and she has been recently diagnosed  with tendinitis.  I have advised the patient to call her primary care  physician, Dr. Loleta Chance, in the a.m. for a followup appointment as she is  not to see him again until January.  The pain is not similar to the pain  prior to the stents.  In fact, the patient states that she has never had  heart pain or shortness of breath prior to her stent placement, and this  was just found on a cardiac stress test.   The patient verbalized understanding.  She asked if it was okay to take  some pain medication to help relieve the pain which I advised that she  do, but I have also advised that she follow up with her primary care  physician for further management of the bursitis and pain.  If the  patient becomes more symptomatic, short of breath, dizzy, lightheaded or  diaphoretic, I have advised her to come to the emergency room via EMS.     [name]. Lyman Bishop, NP  Electronically Signed    KML/MedQ  DD: 12/19/2006  DT: 12/20/2006  Job #: [id number]   cc:   Annia Friendly. Loleta Chance, MD

## 2010-07-23 NOTE — H&P (Signed)
NAME:  [name], [name]                      ACCOUNT NO.:  [id number]   MEDICAL RECORD NO.:  [id number]                   PATIENT TYPE:  AMB   LOCATION:  DAY                                  FACILITY:  [facility]   PHYSICIAN:  Jerolyn Shin C. Katrinka Blazing, M.D.                DATE OF BIRTH:  07/06/1945   DATE OF ADMISSION:  DATE OF DISCHARGE:                                HISTORY & PHYSICAL   HISTORY OF PRESENT ILLNESS:  [age]-year-old female with a several-year  history of gastroesophageal reflux disease.  She has had progressive  difficulty swallowing over the past month.  She has difficulty with solids  and liquids.  She has increasing burping of sour food.  She has not had  vomiting.  The patient is scheduled for upper endoscopy.   PAST HISTORY:  She has:  1. Hypertension.  2. Diabetes mellitus.  3. Hyperlipidemia.  4. Hyperactive bladder syndrome.  5. Osteoarthritis.  6. Chronic anxiety.   MEDICATIONS:  1. Lipitor 20 mg daily.  2. Novasal 1 t.i.d.  3. Allegra 180 mg daily.  4. Cozaar 100 mg daily.  5. Glucophage XR 500 mg daily.  6. Humulin 70/30 -- 60 units q.a.m., 20 units q.p.m.  7. Humulin R sliding scale.   SURGERY:  1. Lumbar disk surgery.  2. Abdominal hysterectomy.  3. Left elbow surgery.  4. Bilateral carpal tunnel release.   PHYSICAL EXAMINATION:  VITAL SIGNS:  On examination, blood pressure 148/70,  pulse 82, respirations 20; weight 227 pounds.  HEENT:  Unremarkable.  NECK:  Neck supple.  No JVD, bruit, adenopathy or thyromegaly.  CHEST:  Chest clear to auscultation.  No rales, rubs, rhonchi or wheezes.  HEART:  Regular rate and rhythm without murmur, gallop or rub.  ABDOMEN:  Abdomen soft, nontender.  No masses.  Normoactive bowel sounds.  EXTREMITIES:  No cyanosis, clubbing or edema.  NEUROLOGIC:  No focal motor, sensory or cerebellar deficit.   IMPRESSION:  1. History of gastroesophageal reflux disease with recurrent dysphagia, rule     out distal esophageal  stricture.  2. Diabetes mellitus.  3. Hypertension.  4. Osteoarthritis.  5. Chronic anxiety.  6. Hyperlipidemia.  7. Hyperactive bladder syndrome.   PLAN:  Upper endoscopy.     ___________________________________________                                         Dirk Dress. Katrinka Blazing, M.D.   LCS/MEDQ  D:  07/16/2003  T:  07/17/2003  Job:  [id number]

## 2010-07-23 NOTE — Assessment & Plan Note (Signed)
[facility]                            CARDIOLOGY OFFICE NOTE   NAME:[name], [name] BOTTOMS                   MRN:          [id number]  DATE:04/25/2006                            DOB:          1946-01-12    [name] returns today after having percutaneous coronary  intervention of her left circumflex.  She had had an abnormal stress  Myoview in the office.  This showed an ejection fraction of 66%, with  moderate ischemia of the anterior wall.  This was new since 2005.  Note,  she was totally asymptomatic.   Dr. Juanda Chance studied her and then brought her back and placed two Taxus  stents in the circumflex.  She had some nonobstructive disease in her  other vessels, as outlined by his note, including a small first diagonal  that had 70%, 80%, and 80% narrowing.  She had normal left ventricular  systolic function.   Since her procedure, she has felt cold.  She thinks this is secondary to  the Plavix.  She denies any chest pain or shortness of breath.  She has  had no groin problems except for feeling a little knot in her left  groin.  She saw Dr. Loleta Chance the other day, and her blood pressure was good.   MEDICATIONS:  1. Aspirin 325 a day.  2. Plavix 75 mg a day.  3. Verapamil 240 b.i.d.  4. Allegra 180 daily.  5. Metoclopramide 10 mg q.i.d.  6. Torsemide 20 mg p.o. b.i.d.  7. Omega-3.  8. Multivitamin.  9. Metformin 500 b.i.d.  10.Lantus.  11.Lipitor 20 mg a day.   PHYSICAL EXAMINATION:  VITAL SIGNS:  Her blood pressure today was  elevated, as it usually in the office, at 195/79, pulse 78 and regular,  weight 216.  GENERAL:  She is in no acute distress.  HEENT:  Normocephalic, atraumatic.  PERRLA.  Extraocular movements  intact.  Sclerae clear.  Facial symmetry is normal.  NECK:  Supple.  Carotid upstrokes are equal bilaterally, without bruits.  There is no thyromegaly.  LUNGS:  Clear.  HEART:  Reveals a poorly appreciated PMI.  She has normal S1  and S2.  ABDOMEN:  Soft.  EXTREMITIES:  Her left groin, which is her catheterization site, showed  a very small subcutaneous nodule consistent with scar.  There was no  bruit.  Peripheral pulses were intact.  There is no edema.  NEUROLOGIC:  Intact.   ASSESSMENT AND PLAN:  I am delighted with [name] outcome.  Though  she was asymptomatic, she says she feels less fatigued.  We have  continued her current medications, and we will see her back again in 2  months.  She will remain on Plavix for a year.     Thomas C. Daleen Squibb, MD, [facility]  Electronically Signed    TCW/MedQ  DD: 04/25/2006  DT: 04/26/2006  Job #: [id number]   cc:   Annia Friendly. Loleta Chance, MD

## 2010-07-23 NOTE — Assessment & Plan Note (Signed)
[facility]                            CARDIOLOGY OFFICE NOTE   NAME:[name], [name]                     MRN:          [id number]  DATE:06/20/2006                            DOB:          1945-11-17    [name] returns today for further management of her coronary artery  disease.  She is status post stenting with 2 TAXUS stents to the  circumflex, February 2008.  She had some residual disease that was  nonobstructive in the small vessels.   She has normal left ventricular systolic function.   She still complains of fatigue.  Ever since I have known her, I think  she has been fatigued, as I told her today!   She is compliant with her meds, including Plavix.  She is in cardiac  rehab in [place] and thoroughly enjoying it.  She is seeing Dr. Loleta Chance today.   MEDICATIONS:  1. Her medications are unchanged since last visit, except for      torsemide at 20 mg t.i.d.  2. Her aspirin is 325.  3. Plavix 75 daily.  4. Verapamil 240 b.i.d.  5. Fish oil t.i.d.  6. Lipitor 20 nightly.   EXAM:  Blood pressure is 158/72.  She usually runs around 130 to 140 in  rehab.  This is really good for her.  Pulse 68 and regular.  Weight is  212.  She is in no acute distress.  HEENT:  Normocephalic, atraumatic.  Sclerae slightly injected.  PERRLA.  Extraocular movements intact.  Facial symmetry is normal.  Carotid upstrokes are equal bilaterally without bruits.  There is no  JVD.  Thyroid is not enlarged.  Trachea is midline.  LUNGS:  Clear.  HEART:  Reveals a soft S1, S2.  PMI poorly appreciated.  ABDOMEN:  Protuberant.  Good bowel sounds.  Organomegaly cannot be  assessed.  EXTREMITIES:  Reveal only trace edema.  Pulses are present.  NEURO:  Intact.   ELECTROCARDIOGRAM:  Shows sinus rhythm with ST segment depression with T  wave inversion in the lateral leads and some flattening in the inferior  leads.  This is stable.   ASSESSMENT AND PLAN:  [name] is  doing well.  I have made no change  in her program.  I have reinforced today to stay on Plavix for a total  of a year after stenting, which would be February 2009.  I will plan on  seeing her back in 4 months.  At that point we will  perform a perfusion scan to make sure that her ischemia has reversed and  that her stents are patent.     Thomas C. Daleen Squibb, MD, [facility]  Electronically Signed    TCW/MedQ  DD: 06/20/2006  DT: 06/20/2006  Job #: [id number]   cc:   Annia Friendly. Loleta Chance, MD

## 2010-07-23 NOTE — Assessment & Plan Note (Signed)
[facility]                            CARDIOLOGY OFFICE NOTE   NAME:Shugart, ROBBY PIRANI                   MRN:          [id number]  DATE:03/08/2006                            DOB:          10/29/1945    Ms. Fouch returns today for the following problems:   1. Nonobstructive coronary disease, 80% distal LAD and nonobstructive      disease in the circumflex on the right.  She has normal left      ventricular function, EF of 70%.  2. Chronic chest pains.  3. Diabetes.  4. Hypertension.  5. Hyperlipidemia.  6. Obesity.   She is having some occasional chest pain in the right breast.  It is not  associated with any nausea, vomiting, diaphoresis or radiation.  She  denies any orthopnea or PND, but has had some trace pitting edema.   Her blood pressure has been up and down and is up again today.  Her  blood sugars are under better control, she says.   MEDICATIONS:  1. Lipitor 10 mg p.o. q.h.s.  2. Insulin 70/30 60 units in the morning, 15 units at night.  3. Metoclopramide 10 mg q.i.d.  4. Cozaar 100 mg daily.  5. Verapamil 240 mg b.i.d.  6. Aspirin 325 a day.  7. Demodex 20 mg b.i.d.  8. Calcium 500 mg a day.  9. Allegra 80 mg daily.  10.Metformin 500 mg b.i.d.   Her blood pressure today is 185/83 in the right arm, pulse 71 and  regular, weight is 213.  There has been no change.  HEENT:  Normocephalic, atraumatic.  PERRLA.  Extraocular movements  intact.  Sclerae are clear.  Facial symmetry is normal.  Carotid upstrokes are equal bilaterally, without bruits.  No JVD.  Thyroid is not enlarged.  Trachea is midline.  LUNGS:  Clear.  HEART:  Reveals a regular rate and rhythm.  There is an S4 at the apex.  PMI is slightly displaced.  ABDOMINAL EXAM:  Soft with good bowel sounds.  There is no hepatomegaly.  There is no midline bruit.  There is no tenderness.  Bowel sounds are  present.  EXTREMITIES:  Reveal no cyanosis or clubbing, but there  is 1+ pretibial  edema.  Pulses are intact.  NEUROLOGIC EXAM:  Intact.  MUSCULOSKELETAL:  Grossly intact.  SKIN:  Grossly intact.   Electrocardiogram shows sinus rhythm with T-wave changes  inferolaterally.  It is fairly stable, compared to last year, except  perhaps slightly increased in the lateral leads.   ASSESSMENT:  Ms. Zagal is fairly stable from a cardiovascular  standpoint.  I worry about silent ischemia, particularly with  uncontrolled risk factors.  It has been over two years since she has had  an objective assessment of her coronary disease.   PLAN:  1. Adenosine Myoview.  If she has anything additional to apical      ischemia for her distal LAD disease, she may need a      catheterization.  2. Continue current medications.  3. Tight risk factor control reinforced.     Thomas C. Wall, MD, [facility]  Electronically Signed    TCW/MedQ  DD: 03/08/2006  DT: 03/08/2006  Job #: [id number]   cc:   Annia Friendly. Loleta Chance, MD

## 2010-07-23 NOTE — Consult Note (Signed)
[name], [name]            ACCOUNT NO.:  [id number]   MEDICAL RECORD NO.:  [id number]          PATIENT TYPE:  INP   LOCATION:  A216                          FACILITY:  [facility]   PHYSICIAN:  Vida Roller, M.D.   DATE OF BIRTH:  09/19/1945   DATE OF CONSULTATION:  05/10/2004  DATE OF DISCHARGE:                                   CONSULTATION   PRIMARY CARE PHYSICIAN:  Annia Friendly. Loleta Chance, M.D.  Cardiologist, Jesse Sans. Wall,  M.D.   HISTORY OF PRESENT ILLNESS:  Ms. Trevizo is a 65 year old woman who has a  past medical history significant for angina and nonobstructive coronary  disease, diabetes, hypertension and hyperlipidemia who presented to the  [facility] Emergency Department complaining of chest discomfort. She  describes it as sort of intermittent burning sensation under her left breast  which has occurred for about a week.  She thought initially that it might be  shingles and she has had this before in the same distribution and it felt  reasonably similar.  She denies any exaggerating or relieving factors.  She  did not take any nitroglycerin even though she has it at home.  It does not  get worse with exertion.  There is no shortness of breath associated with it  and there is no nausea, vomiting or diaphoresis.  She denies PND or  orthopnea.  She is a little bit limited by orthopedic problems including  some back surgery that she has had, but describes no significant worsening  of the discomfort and she is currently pain free.   PAST MEDICAL HISTORY:  1.  Coronary disease.  She had a heart catheterization in 1999, which showed      50% mid LAD with severe disease in the apex of that vessel.  She has a      60% mid circumflex, a ramus intermedius with some disease in its ostium.      Her third obtuse marginal has some disease.  She has a 40% distal RCA      lesion and some disease in her PDA.  Her LV function was normal and a      repeat adenosine Cardiolite done January  2005, showed a normal ejection      fraction with some question of ischemia in the apex and I guess this had      been similar to a previous Cardiolite that she had prior to her heart      catheterization.  2.  Diabetes.  3.  Hypertension.  4.  Hyperlipidemia.  5.  Hysterectomy.  6.  She has had back surgery.  7.  Carpal tunnel release.   MEDICATIONS PRIOR TO ADMISSION:  1.  Humulin 70/30 60 in the morning, 15 units in the evening.  2.  Aspirin 325 once a day.  3.  Demodex 40 mg once a day.  4.  Glucophage 500 mg twice a day.  5.  Prilosec 20 mg a day.  6.  Lipitor 20 mg a day.  7.  Verapamil 240 mg twice a day.  8.  Cozaar 100 mg a day.  9.  Reglan 10 mg a.c. and h.s.  10. Ditropan 15 mg a day.  11. Toradol 10 mg on a p.r.n. basis.   She is on all those medications in the hospital as well as a heparin and  nitroglycerin drip.   SOCIAL HISTORY:  She lives in [place], [place].  She is married.  She has  three children.  She is unemployed currently.  She does not smoke, drink or  use illicit drugs.   FAMILY HISTORY:  Her mother died of unknown causes.  Father died of  hypertension, diabetes and stroke.  She has one brother who has hypertension  and who had an aneurysm and one brother died in his 94s of complications of  diabetes.   REVIEW OF SYSTEMS:  Her review of systems is generally negative except for  that reviewed in the history of present illness.  CARDIOPULMONARY:  She does  get palpitations with caffeine on occasion.  GASTROINTESTINAL:  She has  problems with GERD symptoms and constipation. She also has an overactive  bladder.   PHYSICAL EXAMINATION:  GENERAL:  She is a well-developed, mildly obese,  African-American female in no apparent distress, alert and oriented x4.  VITAL SIGNS:  Pulse is 59, respirations 18, blood pressure 132/55.  She  weighs 212 pounds.  She is saturating 98% on 2 liters nasal cannula.  HEENT:  Unremarkable.  I did not do a funduscopic  exam.  NECK:  Supple.  She has no jugular venous distension or carotid bruits.  CHEST:  Her chest is clear to auscultation bilaterally.  CARDIAC:  Regular with no murmur.  Point of maximal impulse is not palpable.  First and second heart sounds were normal.  ABDOMEN:  Soft, nontender, normoactive bowel sounds.  GU/RECTAL:  Deferred.  EXTREMITIES:  Without clubbing, cyanosis or edema.  Pulses were 2+  throughout.  MUSCULOSKELETAL:  Unremarkable.  NEUROLOGIC:  Unremarkable.  SKIN:  Unremarkable.  BREASTS:  Deferred.   LABORATORY DATA AND X-RAY FINDINGS:  Chest x-ray shows no acute disease.  Electrocardiogram shows sinus rhythm at a rate of 71 with normal axis,  normal intervals.  She has inverted T-waves in the inferior and  anterolateral leads which is unchanged from EKG done in December 2005.   White blood cell count 7.2, H&H 11 and 33, platelet count 214.  Sodium of  135, potassium 3.0, chloride 99, bicarb 29, BUN 18, creatinine was not  recorded, nor was the blood sugar.  Her liver function studies within normal  limits.  She has had two sets of cardiac enzymes which are not consistent  with acute myocardial infarction.  Her INR 0.8.   ASSESSMENT:  This is a lady with known coronary artery disease which was  nonobstructive.  A recent Cardiolite showed some disease in the apex  consistent with the patient's coronary disease, diabetes and hypertension  who presents with atypical discomfort in her chest.   RECOMMENDATIONS:  Our plan is to titrate her nitroglycerin to off and do a  dobutamine Cardiolite in hopes of assessing the degree of ischemia.  If this  is basically unchanged from her previous done about a year and half ago,  then I think we will probably treat her for noncardiac chest pain.  If on  the other hand, there is progression on the Cardiolite, then I think she  probably benefits from heart catheterization.  I talked to her about this,  she understands and wishes to  proceed.  JH/MEDQ  D:  05/10/2004  T:  05/10/2004  Job:  [id number]   cc:   Annia Friendly. Loleta Chance, MD  [address]  [place]  [place] [address]  Fax: [phone]   Jesse Sans. Wall, M.D.

## 2010-07-23 NOTE — Op Note (Signed)
[name], [name]                      ACCOUNT NO.:  [id number]   MEDICAL RECORD NO.:  [id number]                   PATIENT TYPE:  AMB   LOCATION:  DAY                                  FACILITY:  [facility]   PHYSICIAN:  Dennie Maizes, M.D.                DATE OF BIRTH:  1945-08-23   DATE OF PROCEDURE:  06/23/2003  DATE OF DISCHARGE:                                 OPERATIVE REPORT   PREOPERATIVE DIAGNOSIS:  Urinary frequency, urgency, and mild urgency  urinary incontinence.   POSTOPERATIVE DIAGNOSIS:  Urinary frequency, urgency, mild urgency urinary  incontinence, and ureteral stenosis.   PROCEDURE:  Cystoscopy and ureteral dilation.   ANESTHESIA:  Spinal.   SURGEON:  Dennie Maizes, M.D.   COMPLICATIONS:  None.   INDICATIONS FOR PROCEDURE:  This 65 year old female with troublesome urinary  frequency, urgency, urge incontinence, and mild stress urinary incontinence  was taken to the OR today for further evaluation, cystoscopy, possible  bladder biopsy, and possible hydrodistension of the bladder.   DESCRIPTION OF PROCEDURE:  Spinal anesthesia was induced, and the patient  was placed on the OR table in the dorsal lithotomy position.  The lower  abdomen and genitalia were prepped and draped in a sterile fashion.   A 14 French red rubber catheter was inserted into the bladder and the  __________ 25 cc.  The catheter was removed.   Cystoscopy was done with a _________ [place] scope.  This revealed a  trabeculated bladder.  There was no other abnormality in the bladder.  The  trigone, ureteral orifice, and bladder mucosa were normal.  There was no  evidence of inflammation, foreign body, or tumor in the bladder.  Bladder  capacity was large.  The bladder was then emptied,  and the bladder capacity was about 800 cc.  The ureter calibrated to 20  [place] and then dilated up to 41 [place] with straight metal sounds.  Pelvic  examination was negative.   The patient was  transferred to the PACU in satisfactory condition.      ___________________________________________                                            Dennie Maizes, M.D.   SK/MEDQ  D:  06/23/2003  T:  06/23/2003  Job:  [id number]   cc:   Annia Friendly. Loleta Chance, M.D.  [address]  [place]  [place] [address]  Fax: [phone]

## 2010-07-23 NOTE — Cardiovascular Report (Signed)
[name], [name]            ACCOUNT NO.:  [id number]   MEDICAL RECORD NO.:  [id number]          PATIENT TYPE:  OIB   LOCATION:  6533                         FACILITY:  [facility]   PHYSICIAN:  Everardo Beals. Juanda Chance, MD, FACCDATE OF BIRTH:  02-13-1946   DATE OF PROCEDURE:  04/11/2006  DATE OF DISCHARGE:                            CARDIAC CATHETERIZATION   HISTORY:  [name] is 65 years old and has had a history of  nonobstructive pulmonary disease.  She recently had a Myoview scan which  was abnormal and Dr. Daleen Squibb arranged for her to be studied in the  outpatient laboratory and this showed a 95% stenosis in the mid-  circumflex artery, which was a long, diffusely diseased area.  She also  had a distal LAD lesion.  We brought her back today for intervention on  the circumflex artery.  She is not having any anginal symptoms.   PROCEDURE:  We initially attempted the procedure by the right femoral  artery, but were unable to obtain access even with a Doppler needle.  We  then approached her via the left femoral artery.  We were able to obtain  access without too much difficulty.  The patient was given Angiomax  bolus infusion and had been previously been on Plavix for the past week.  She was also given non-icteric-coated aspirin today 325 mg.  We used a  CLS 3.5 guiding catheter with side holes and a Prowater wire.  We  crossed the lesion in the mid-circumflex artery with the wire without  difficulty.  We then pre-dilated with a 2.25 x 20 mm Maverick performing  total of five inflations up to 10 atmospheres by 30 seconds.  We were  not able to get complete expansion of the balloon in one area, so we  changed for a 2.25 x 20 mm Quantum Maverick and then performed two  inflations up to 16 atmospheres by 30 seconds, which gave full expansion  of the balloon.  We then deployed a 2.5 x 24 mm Taxus stent which  extended from the mid to the distal vessel.  We deployed this with one  inflation for  10 atmospheres by 30 seconds.  We then deployed a second  2.75 x 20 mm Taxus stent, overlapping the first stent and proximal to  the first stent, deploying this with one inflation of 14 atmospheres by  30 seconds.  We then post-dilated the distal and mid-portion of both  stents with a 2.5 x 20 mm Quantum Maverick performing two inflations up  to 20 atmospheres by 30 seconds.  We then post-dilated the proximal  stent and the overlapping stents with a 3.0 x 15 mm Quantum Maverick  performing 4 inflations up to 20 atmospheres by 30 seconds.  Final  __________  guiding catheter.  The patient tolerated the procedure well  and left the lab in satisfactory condition.   RESULTS:  Initially stenosis in the mid-circumflex artery was estimated  at 95% and extended over a long area in the end of the proximal and the  mid-vessel and into the mid to distal vessel.  Following stenting, this  improved to 0%.   CONCLUSION:  Successful PCI of a long, diffusely diseased segment in the  mid-circumflex artery using two overlapping Taxus drug-eluting stents  with improvement in the stenosis narrowing from 95% to 0%.   DISPOSITION:  The patient to return to the __________  for further  observation.      [name] Elvera Lennox Juanda Chance, MD, [facility]  Electronically Signed     BRB/MEDQ  D:  04/11/2006  T:  04/12/2006  Job:  [id number]   cc:   Thomas C. Daleen Squibb, MD, [facility]  Annia Friendly. Loleta Chance, MD  Cardiopulmonary Lab

## 2010-07-23 NOTE — Procedures (Signed)
[name], [name]            ACCOUNT NO.:  [id number]   MEDICAL RECORD NO.:  [id number]          PATIENT TYPE:  INP   LOCATION:  A216                          FACILITY:  [facility]   PHYSICIAN:  Vida Roller, M.D.   DATE OF BIRTH:  1946/01/17   DATE OF PROCEDURE:  05/10/2004  DATE OF DISCHARGE:                                    STRESS TEST   HISTORY:  [name] is a 65 year old female with coronary artery disease  by catheterization in 1999. She had nonobstructive coronary disease with an  80% apical LAD with normal EF at that time. Last evaluation for ischemia was  in January 2005 with an adenosine Cardiolite that revealed mild ischemia at  the apex with a normal ejection fraction. No wall motion abnormalities. This  was similar to a previous study by report. The patient is now admitted to  [facility] with atypical chest discomfort. She has had two sets of  cardiac enzymes that are negative for acute myocardial infarction.   BASELINE DATA:  EKG reveals a sinus rhythm at 71 beats per minute with  inverted T-waves in leads I and II, III, aVF and V4-V6. Blood pressure is  152/78.   Dobutamine was infused up to 40 mcg with the addition of exercise to reach  heart rate of 136 which is 84% of predicted maximum. Maximum blood pressure  was 212/80 and resolved down to 144/72 in recovery. EKG actually normalized  with dobutamine infusion and returned to baseline abnormal in recovery. She  had few PVCs. The patient complained of palpitations; however, no chest  discomfort or shortness of breath.   The final images and results are pending M.D. review.      AB/MEDQ  D:  05/10/2004  T:  05/10/2004  Job:  [id number]

## 2010-07-23 NOTE — Assessment & Plan Note (Signed)
[facility]                                 ON-CALL NOTE   NAME:[name], [name]                     MRN:          [id number]  DATE:05/22/2006                            DOB:          [birth year]    A patient of Dr. Daleen Squibb.  Date of telephone call May 22, 2006 at 1844  hours and again at 1918 hours.  I returned the phone call to Ms.  Centanni.  The initial phone call, however, there was no answer by phone.  She is having nosebleeds.  The second time, I reached the patient at [phone]  [phone].  She states she is on aspirin and Plavix, and recently had a  stent placed.  She is complaining of some mild nosebleeds that are more  aggravating than anything.  She states that she was not sure what she  needed to do.  I told her that, unless they were excessively large  amount that she could call and speak with Dr. Vern Claude nurse in the  morning, that discontinuing her Plavix was not an option.  I told her if  the nosebleeds became heavier, that she needed to come to the emergency  room and possibly have her nose packed.  Upon hearing this, [name]  states that she has had problems with nosebleeds in the past and  actually has had to have her nose packed before (this was prior to being  on aspirin and Plavix).  I informed her once again to call our office in  the morning.  Possibly may need to have an ENT evaluation if nosebleeds  continue.  The patient agreed and verbalized understanding of the  instructions.      Dorian Pod, ACNP  Electronically Signed      Gerrit Friends. Dietrich Pates, MD, [facility]  Electronically Signed   MB/MedQ  DD: 05/23/2006  DT: 05/23/2006  Job #: [id number]

## 2010-07-23 NOTE — H&P (Signed)
[name], [name]            ACCOUNT NO.:  [id number]   MEDICAL RECORD NO.:  [id number]          PATIENT TYPE:  OIB   LOCATION:  6533                         FACILITY:  [facility]   PHYSICIAN:  Thomas C. Wall, MD, FACCDATE OF BIRTH:  1945/04/22   DATE OF ADMISSION:  04/11/2006  DATE OF DISCHARGE:  04/12/2006                              HISTORY & PHYSICAL   PRIMARY CARDIOLOGIST:  Dr. Juanito Doom.   PRIMARY CARE PHYSICIAN:  Dr. Evlyn Courier.   HISTORY OF PRESENT ILLNESS:  This is a 65 year old African American  female with a history of nonobstructive cardiopulmonary disease, most  recent Myoview scan which was abnormal.  The patient had a cardiac  catheterization on April 04, 2006 revealing diffuse disease of the  left anterior descending with a 40% proximal, 70% mid and 80% distal  stenosis, along with an 80% stenosis of the first diagonal branch.  There was a 90-80% stenosis in the proximal mid circumflex artery with  40% narrowing in the mid coronary with normal left ventricular systolic  function.  As a result of this, it was deemed necessary for Dr. Charlies Constable that the patient have intervention on the circumflex artery and  she is admitted a cardiac catheterization and intervention for this  lesion.   PAST MEDICAL HISTORY:  Includes:  1. Hypertension.  2. Hyperlipidemia.  3. Type 2 diabetes, now insulin dependent.  4. Obesity.  5. Gastroesophageal reflux disease.   PAST SURGICAL HISTORY:  1. Carpal tunnel surgery.  2. Trigger finger repair.  3. Cervical lymph node biopsies.  4. Multiple surgeries to her back, involving intravertebral discs.  5. Hysterectomy.   SOCIAL HISTORY:  The patient lives with her husband in [place].  She  is unemployed.  She does not smoke.  She does not drink alcohol.   FAMILY HISTORY:  Father with hypertension, diabetes and a CVA.  Mother  died very early.  She does not know medical history.  She has a brother  who has hypertension,  aneurysm is alive.  She has another brother with  diabetes who died at age 66, she is uncertain of cause.  The patient has  a history of colon cancer in 1 of her uncles.   REVIEW OF SYSTEMS:  Ten-point review of systems was done, which was  essentially negative for major concerns.   LABS:  Sodium 138, potassium 4.0, chloride 100, CO2 32, glucose 97, BUN  16, creatinine 0.92.  CBC:  Hemoglobin 11.0, hematocrit 33.2, white  blood cell is 4.6, platelets is 195.  PT 13.1, INR 1.0, PTT 31, troponin  0.03.   VITAL SIGNS:  On admission, blood pressure 146/51.  Pulse 62.  Respirations 20.  Temperature 98.1.  O2 saturation 93% on room air.  HEENT:  Head is normocephalic and atraumatic.  Eyes:  PERRLA.  Mucous  membranes and mouth pink and moist.  Tongue is midline.  NECK:  Supple without JVD or carotid bruits appreciated.  CARDIOVASCULAR:  Regular rate and rhythm without murmurs, rubs or  gallops.  LUNGS:  Clear to auscultation.  ABDOMEN:  Obese, nontender, 2+ bowel sounds.  EXTREMITIES:  Without clubbing, cyanosis or edema.  There is mild  nonpitting edema noted around the ankles.   IMPRESSION:  1. Coronary artery disease, status post cardiac catheterization      April 04, 2006 with multivessel disease with most prominent 90-      80% stenosis in the proximal to mid circumflex artery with 40%      narrowing in the mid coronary artery with normal left ventricular      function.  Here for intervention.  2. Hypertension.  3. Hyperlipidemia.  4. Type 2 diabetes.  5. Obesity.  6. Gastroesophageal reflux disease.   PLAN:  The patient will undergo cardiac catheterization with  intervention to the circumflex artery per Dr. Juanda Chance as previously  scheduled.  We will make further recommendations throughout  hospitalization based upon results of intervention and labs.      [name]. Lyman Bishop, NP      Jesse Sans. Daleen Squibb, MD, [facility]  Electronically Signed    KML/MEDQ  D:  06/28/2006  T:   06/28/2006  Job:  [id number]   cc:   Annia Friendly. Loleta Chance, MD

## 2010-07-23 NOTE — H&P (Signed)
[name], CHUANG                      ACCOUNT NO.:  [id number]   MEDICAL RECORD NO.:  [id number]                   PATIENT TYPE:  AMB   LOCATION:  DAY                                  FACILITY:  [facility]   PHYSICIAN:  Dennie Maizes, M.D.                DATE OF BIRTH:  1945-04-13   DATE OF ADMISSION:  06/23/2003  DATE OF DISCHARGE:                                HISTORY & PHYSICAL   CHIEF COMPLAINT:  Urinary frequency, urgency, urge urinary incontinence,  mild stress urinary incontinence.   HISTORY OF PRESENT ILLNESS:  This 65 year old female is referred to me by  Dr. Evlyn Courier.  She has troublesome urinary frequency, urgency, urge  urinary incontinence and mild stress urinary incontinence for several  months.  She has been under my care since last year.  Initial evaluation in  the office revealed a large postvoid residual urine.  Urethral dilation was  done.  She has been treated with various anticholinergic drugs with partial  or no improvement.   The patient complains of having urinary frequency and urgency at present.  She has to void 6-8 times during the day and gets up every 2-3 hours during  the night to pass urine.  She also has occasional urge urinary incontinence  and mild stress urinary incontinence.  Her urine cytology has been negative.  She is brought to the day hospital today for cystoscopy for further  evaluation.   The patient denied any having any flank pain or hematuria at present.  She  has a past history of urolithiasis.  She has had renal colic in 2001.   PAST MEDICAL HISTORY:  1. History of type 2 diabetes mellitus.  2. Hypertension.  3. Hyperlipidemia.  4. Status post hysterectomy.   MEDICATIONS:  1. Insulin 70/30.  2. Glucophage.  3. Lipitor.  4. Verapamil.  5. Zocor.  6. Demadex.  7. Aspirin.  8. Metoclopramide.   ALLERGIES:  CODEINE.   EXAMINATION:  HEENT:  Head, eyes, ears, nose and throat normal.  NECK:  No masses.  LUNGS:   Lungs clear to auscultation.  HEART:.  Regular rate and rhythm.  No murmurs.  ABDOMEN:  Soft.  No palpable flank mass.  No CVA tenderness.  Bladder not  palpable.  PELVIC:  Examination negative.  Status post hysterectomy.   IMPRESSION:  Urinary frequency, urgency, urge incontinence, mild stress  urinary incontinence.   PLAN:  The patient has not responded to various anticholinergic medications.  Her urine cytology is negative for malignant cells.  She is brought to the  day hospital today for further evaluation with cystoscopy and possible  bladder biopsy.  I plan to rule out interstitial cystitis.  If there are  features suggestive of interstitial cystitis, she will undergo  hydrodistention of the bladder under anesthesia.   I have discussed with the patient regarding the diagnosis, the operative  details, the alternative treatments and outcome, the  possible risks and  complications and she has agreed for the procedure to be done.     ___________________________________________                                         Dennie Maizes, M.D.   SK/MEDQ  D:  06/22/2003  T:  06/23/2003  Job:  [id number]   cc:   Annia Friendly. Loleta Chance, M.D.  [address]    [place] [address]  Fax: [phone]

## 2010-07-23 NOTE — Assessment & Plan Note (Signed)
[facility]                            CARDIOLOGY OFFICE NOTE   NAME:[name], [name]                   MRN:          [id number]  DATE:03/31/2006                            DOB:          1945-11-13    [name] is a delightful 65 year old black female who comes  in today for pre heart catheterization for an abnormal stress Myoview.   She has a history of nonobstructive coronary disease except for an 80%  distal LAD which has caused some apical ischemia on stress tests in the  past. She has been asymptomatic. She has normal left ventricular  function.   A recent stress Myoview showed moderate ischemia of the anterior wall  which is worse than previous studies. Her EF was 66%. There was mild  decrease in thickening of the lateral wall.   She had a proximal or mid 60% LAD in the past. I am concerned about  progressive disease.   She is totally asymptomatic.   CURRENT MEDICATIONS:  1. Lipitor 10 mg q.h.s.  2. Insulin 70/30 60 units in the morning, 15 units at night.  3. Metoclopramide 10 mg q.i.d.  4. Cozaar 100 mg a day.  5. Verapamil 240 b.i.d.  6. Aspirin 325 a day.  7. Demadex 20 mg p.o. b.i.d.  8. Calcium 500 mg a day.  9. Allegra 80 mg a day.  10.Metformin 500 b.i.d.   MEDICATIONS:  She is intolerant of ACE INHIBITORS for coughing reasons.  DIOVAN, IMDUR, and CODEINE make her itch.   OTHER PROBLEMS:  1. History of hypertension.  2. Hyperlipidemia.  3. Obesity.  4. Gastroesophageal reflux.  5. Type 2 diabetes now insulin dependent.   SOCIAL HISTORY:  She is married. She has three children. She is  unemployed presently. She does not smoke, drink or use any illicit  drugs.   FAMILY HISTORY:  Father died of hypertension, diabetes, and a stroke.  She has one brother with hypertension, and one brother who died of  complications of diabetes in his 35s.   REVIEW OF SYSTEMS:  Other than the HPI is negative. She denies  any  dysphagia, cough, hemoptysis, hematemesis, melena, hematochezia, change  in bowel habits, orthopnea, PND or peripheral edema.   PHYSICAL EXAMINATION:  GENERAL:  She is extremely pleasant.  VITAL SIGNS:  Blood pressure 188/82 in the right arm, 164/70 in the left  arm, pulse 75 and regular, weight 214.  HEENT:  Normocephalic, atraumatic. PERRLA, extraocular movements intact.  Sclerae clear. Facial symmetry is normal.  NECK:  Carotid upstrokes are equal bilaterally without bruits. There is  no JVD. Thyroid is not enlarged. Trachea is midline. Neck supple.  LUNGS:  Clear to auscultation and percussion.  EXTREMITIES:  Upper extremities reveal good pulsations in both arms. No  cyanosis, clubbing, but there is 1+ pretibial edema. Pulses are present  but reduced.  HEART:  Reveals regular rate and rhythm without gallop. There is an S4  at the apex. Her PMI is slightly displaced.  ABDOMEN:  Soft with good bowel sounds. No hepatomegaly, no midline  bruit, no tenderness.  NEUROLOGICAL:  Intact.  MUSCULOSKELETAL:  Grossly intact.  SKIN:  Grossly intact and normal.   EKG shows T wave changes inferolaterally. These have not really changed,  but have perhaps increased slightly in the lateral leads.   ASSESSMENT:  Ms. Brassfield has known coronary artery disease. She is  diabetic and asymptomatic. Her stress Myoview shows increased ischemia  in the more proximal and distal anterior wall compared to previous  scans. I am worried about progressive disease in the mid proximal LAD.  It may involve a diagonal with the lateral ST segment changes and  lateral decreased wall motion on her cine loops.   PLAN:  Outpatient cardiac catheterization. Indications, risks, potential  benefits have been discussed. Will do __________ only with her history  of diabetes. I do not have a recent creatinine. Will obviously check  precath labs.   Patient agrees to proceed.   Discrepancy in her upper extremities  blood pressure is also a little bit  of a concern. I suspect she may have a little bit of subclavian stenosis  on the left. We will not do an angiogram in the lab, but may do Dopplers  down the road.     Thomas C. Daleen Squibb, MD, [facility]  Electronically Signed    TCW/MedQ  DD: 03/31/2006  DT: 03/31/2006  Job #: [id number]   cc:   Annia Friendly. Loleta Chance, MD

## 2010-07-23 NOTE — Discharge Summary (Signed)
[name], [name]            ACCOUNT NO.:  [id number]   MEDICAL RECORD NO.:  [id number]          PATIENT TYPE:  OIB   LOCATION:  6533                         FACILITY:  [facility]   PHYSICIAN:  Everardo Beals. Juanda Chance, MD, FACCDATE OF BIRTH:  Mar 14, 1945   DATE OF ADMISSION:  04/11/2006  DATE OF DISCHARGE:  04/12/2006                               DISCHARGE SUMMARY   PRIMARY CARDIOLOGIST:  Maisie Fus C. Daleen Squibb, MD, [facility]   PRIMARY CARE PHYSICIAN:  Annia Friendly. Loleta Chance, MD   PROCEDURES PERFORMED DURING HOSPITALIZATION:  Cardiac catheterization on  April 11, 2006.  A.  Successful PCI of a long diffusely diseased segment in the mid  circumflex artery using 2 overlapping Taxus drug eluding stent.  B.  Prior cardiac catheterization on April 04, 2006 revealed diffuse  disease in the left anterior descending with a 40% proximal, 70% mid and  80% distal stenosis and 80% stenosis in the first diagonal branch.  90-  80% stenosis in the proximal to mid circumflex artery with 40% narrowing  in the mid coronary with normal left ventricular function.   SECONDARY DIAGNOSES:  1. Hypertension.  2. Hyperlipidemia.  3. Type 2 diabetes, now insulin dependent.  4. Obesity.  5. Gastroesophageal reflux disease.   HOSPITAL COURSE:  This is a 65 year old African-American female with a  history of nonobstructive pulmonary disease with a recent  Myoview scan  which was abnormal.  The patient had a prior cardiac catheterization as  discussed above with a 95% stenosis in the mid circumflex artery which  was long diffusely diseased.  The patient was admitted for PCI of the  circumflex per Dr. Juanda Chance.   Cardiac catheterization with PCI of the circumflex was completed on  April 11, 2006 with good result. The patient had no further complaints  of chest discomfort.  The patient recovered well without evidence of  hematoma, bleeding or infection at the cardiac catheterization site.  The patient's blood pressure remained  normal throughout hospitalization.  There was no arrhythmia seen through telemetry in the following 24 hours  post procedure.  The patient was seen and examined by Dr. Charlies Constable  on day of discharge and was found to be stable and able to return home.  The patient was to hold her metformin today and not start again until  tomorrow.  The patient otherwise had done well and is ready for  discharge.   LABORATORIES ON DISCHARGE:  Hemoglobin 10.2, hematocrit 29.9, white  blood cells 5.7, platelets 167, PT 13.1, INR 1.0, PTT 30, sodium 136,  potassium 3.5, chloride 104, CO2 25, glucose 164, BUN 14, creatinine  0.75, troponin 0.03.   Vital signs on discharge temperature 99.9.  Blood pressure 141/47.  Pulse 69.  Respirations 18.  O2 sat 94% on room air.   FOLLOWUP PLANS AND APPOINTMENTS:  1. The patient will follow up with Dr. Valera Castle on April 24, 2006 at 3:15 p.m.  2. The patient will follow up with her primary care physician for      continued medical management of diabetes.  3. The patient has been advised  not to take metformin today and to      resume it tomorrow.  4. The patient has been given postcardiac catheterization instructions      with particular emphasis on left groin site to evaluate for      bleeding, hematoma, pain or signs of infection.  5. The patient's discharge medications have been discussed with no      changes in her medical regimen but particular emphasis on need to      continue to take Plavix daily.   DISCHARGE MEDICATIONS:  1. Plavix 75 mg daily.  2. Aspirin 325 mg daily.  3. Verapamil 240 mg twice a day.  4. Demodex 40 mg daily.  5. Zocor 40 mg daily.  6. Exforge (Avapro and Norvasc daily).  7. Lantus 24 units q.p.m.  8. Xanax 0.25 mg as needed.  9. Reglan 10 mg before meals and at bedtime.  10.Allegra 180 mg daily as needed.   ALLERGIES:  CODEINE.   Time spent with the patient to include physician time 40 minutes.      [name].  Lyman Bishop, NP      Everardo Beals. Juanda Chance, MD, [facility]  Electronically Signed    KML/MEDQ  D:  04/12/2006  T:  04/13/2006  Job:  [id number]   cc:   Annia Friendly. Loleta Chance, MD

## 2010-07-23 NOTE — Discharge Summary (Signed)
[name], [name]            ACCOUNT NO.:  [id number]   MEDICAL RECORD NO.:  [id number]          PATIENT TYPE:  INP   LOCATION:  A216                          FACILITY:  [facility]   PHYSICIAN:  Lonia Blood, M.D.      DATE OF BIRTH:  12-14-1945   DATE OF ADMISSION:  05/09/2004  DATE OF DISCHARGE:  03/07/2006LH                                 DISCHARGE SUMMARY   PRIMARY CARE PHYSICIAN:  Wayne A. Sheffield Slider, M.D.  Cardiologist, Jesse Sans. Wall,  M.D., [facility].   DISCHARGE DIAGNOSES:  1.  Chest pain with positive dobutamine Cardiolite.  2.  Diabetes, type 2, mildly controlled.  3.  Hypertension.  4.  Hypokalemia.  5.  Dyslipidemia.  6.  Obesity.  7.  Gastroesophageal reflux disease.   DISCHARGE MEDICATIONS:  1.  Humulin 70/30 insulin 60 units in the morning, 50 units in the evening.  2.  Aspirin 325 mg daily.  3.  Demodex 40 mg daily.  4.  Glucophage 500 mg b.i.d.  5.  Prilosec 20 mg daily.  6.  Lipitor 20 mg q.d.  7.  Verapamil 250 mg b.i.d.  8.  Cozaar 100 mg q.d.  9.  Reglan 10 mg every meal and at bedtime.  10. Ditropan 50 mg q.d.  11. Imdur 30 mg daily.   FOLLOW UP:  The patient is to follow up with Dr. Daleen Squibb and Dr. Sheffield Slider in 1-2  weeks.   CONDITION ON DISCHARGE:  The patient is being discharged with no chest pain  at this point.  She is also doing much better.  The only thing is that her  CBGs are slightly up, but this is prior to getting her normal dose of  insulin and Glucophage.   PROCEDURES:  1.  Chest x-ray performed on May 09, 2004, that shows no active lung      disease.  2.  Dobutamine Cardiolite performed by Dr. Dorethea Clan which shows an abnormal      perfusion scan and pattern consistent with left anterior descending      ischemia consistent with the patient's known coronary artery disease.   CONSULTATIONS:  Dr. Dorethea Clan, [facility].   HISTORY OF PRESENT ILLNESS:  [name] is a 64 year old, African-American  female with known cardiac history  who was being followed by Dr. Daleen Squibb.  She  came in on the day of admission with progressive chest pain that started on  an off for about a week.  The chest pain is localized right under her left  breast.  She described it as 7/10 and up to 10/10 sometimes.  She described  a shingles like pain.  No radiation and no activating factors.  The patient  was found to be stable in the emergency room with the pain at that time only  1/10.  Her vitals were essentially stable except for a blood pressure of  149/67.  Based on the patient's risk factors for heart disease including  dyslipidemia, obesity, hypertension and diabetes, the patient was admitted  for rule out MI.   HOSPITAL COURSE:  Problem 1.  CHEST PAIN:  The patient  was admitted and  cardiac enzymes were checked serially to rule in or out MI.  Initial CK was  189 and then 198.  CK-MB was 2.1 and then 2.0.  Troponin was initially 0.04  and then 0.02.  The patient was therefore ruled out for MI at this point.  Cardiology was consulted and the patient had workup that included dobutamine  Cardiolite as indicated above.  The patient was offered the option of either  catheterization or medical management and she opted for medical management.  Prior to that, the patient has received nitroglycerin as well as heparin  during this hospitalization.   Problem 2.  DIABETES:  The patient had good control of her diabetes prior to  admission.  She was, however, off her medications while in the hospital and  her sugar was as high as 300+.  Subsequently, she was restarted on her  Glucophage and her 70/30 insulin and her CBGs have since dropped into  acceptable limits prior to discharge.   Problem 3.  HYPERTENSION:  Her blood pressure was for the most part  controlled between 130-140s.  It was not quite optimal sometimes, but the  patient was restarted back on her home medicine and since has had good  control.  In addition, she had Imdur added as part of  the medical management  of her ischemia.   Problem 4.  TRANSIENT HYPOKALEMIA:  The patient's potassium dropped into the  3.0 range on the next day which could have been secondary to some elements  of diarrhea or diuresis.  Subsequently, potassium was repleted and it was  back to 3.9 on the day of discharge.   Problem 5.  DYSLIPIDEMIA:  The patient has been taking statin and that was  continued during this hospitalization.   Problem 6.  GASTROESOPHAGEAL REFLUX DISEASE:  The patient continued to have  GERD during the hospitalization.   Problem 7.  OBESITY:  She was continuously counseled regarding her obese  status.   DISCHARGE LABORATORY DATA AND X-RAY FINDINGS:  White count 4.9, hemoglobin  11.2, platelets 222.  TSH is 0.973.  Sodium 137, potassium 3.9, chloride 98,  CO2 30, BUN 18, creatinine 1.0 and glucose 168, calcium 8.8.      LG/MEDQ  D:  05/11/2004  T:  05/11/2004  Job:  [id number]   cc:   Deniece Portela A. Sheffield Slider, M.D.  Fax: [phone]   Jesse Sans. Wall, M.D.

## 2010-07-23 NOTE — H&P (Signed)
[name], [name]            ACCOUNT NO.:  [id number]   MEDICAL RECORD NO.:  [id number]          PATIENT TYPE:  INP   LOCATION:  A216                          FACILITY:  [facility]   PHYSICIAN:  Osvaldo Shipper, MD     DATE OF BIRTH:  1946/02/08   DATE OF ADMISSION:  05/09/2004  DATE OF DISCHARGE:  LH                                HISTORY & PHYSICAL   PRIMARY CARE PHYSICIAN:  Annia Friendly. Loleta Chance, M.D.   ADMISSION DIAGNOSES:  1.  Chest pain, rule out myocardial infarction.  2.  Diabetes.  3.  Hypertension.  4.  Obesity.  5.  Hypercholesterolemia.   CHIEF COMPLAINT:  Chest pain for about 4-5 days.   HISTORY OF PRESENT ILLNESS:  The patient is a 65 year old African-American  female who has got past medical history of diabetes, hypertension, obesity,  dyslipidemia, who presents to the emergency room with left-sided chest pain  underneath her left breast which started sometime last week, but the patient  is unable to give me the exact date.  The patient does not remember what she  was doing when this chest discomfort started.  She described this discomfort  as a burning sensation much like her shingles pain that she had back in the  1980s, but it on the left side  underneath her left breast.  At the most,  the discomfort was about 7/10 with 10 being the worst.  Currently it is  about 1/10. The sensation does not radiate anywhere.  It just stays along  underneath the left breast area and in the axillary area.  The patient  denied any diaphoresis.  She denied any nausea or vomiting.  She denied any  shortness of breath with this pain.  Since the pain started the pain has  been on and off, subsiding on its own without any medication, but eventually  reappearing.  The pain reappeared last night while she was sleeping.  She  spent the night with the pain being present most of the time.  This morning  the pain again was present and it did not seem to go away as it was doing  previous days.   Hence the patient decided to come to the emergency room.  She denied any palpitations with this chest discomfort.  There was no  history of any dizziness or any syncopal episode.   The patient has seen Dr. Daleen Squibb who is a cardiologist with [facility],  currently at [facility], for periodic checkups over the past few  years.  She has had multiple stress tests and also coronary angiogram done  in the past.  The angiogram apparently was done about two years ago which  the patient states showed minimal blockage in one of her vessels, but she is  unclear to further details.  She did have a stress test done last year,  apparently which was negative.  She has also had echocardiograms in the past  which have shown normal function apparently.  I am not able to review all  these studies from the echart at this time.   MEDICATIONS:  1.  Humulin 70/30 60 units in the morning and 15 units in the p.m.  2.  Aspirin 325 mg daily.  3.  Demodex 40 mg p.o. daily.  4.  Glucophage 500 mg b.i.d.  5.  Prilosec 20 mg daily.  6.  Lipitor 20 mg daily.  7.  Verapamil 250 mg b.i.d.  8.  Cozaar 100 mg daily.  9.  Reglan 10 mg before every meal and at bedtime for reflux disease.  10. Ditropan 15 mg p.o. daily.  11. Toradol 10 mg p.r.n. for pain.   ALLERGIES:  She is allergic to CODEINE, which causes a rash.   PAST MEDICAL HISTORY:  1.  As mentioned above, diabetes.  2.  Hypertension.  3.  Obesity.  4.  Dyslipidemia.  5.  She has had a hysterectomy done in the past.  6.  She has had surgeries to her back involving intervertebral disks.  7.  She has had cervical lymph node biopsies done in the past.  8.  She has had surgery for trigger finger.  9.  She has also had surgery for carpal tunnel.   SOCIAL HISTORY:  The patient currently lives with her husband.  She is  unemployed.  She does not smoke any cigarettes, never has.  No alcohol use.  No illicit drug use.   FAMILY HISTORY:  Her father  had hypertension and diabetes and stroke.  Mother died very early.  She does not know her medical history.  She also  has a brother who has hypertension and aneurysm and is alive.  She has  another brother who had diabetes but who died in his 33s, she does not know  of what.  She also has history of colon cancer in one of her uncles.   REVIEW OF SYSTEMS:  A 10 point review of systems was done which was  essentially negative for any major concerns.   PHYSICAL EXAMINATION:  VITAL SIGNS:  Temperature was 98, blood pressure  149/67.  On arrival to the ER it was 159/80.  Her heart rate is about 70  beats per minute.  Respiratory rate is about 16.  Pulse oximetry is 100% on  2 L nasal cannula.  GENERAL:  An obese female in no apparent distress.  HEENT:  There is no pallor, no icterus present.  Oral mucous membranes moist  with no lesions seen.  NECK:  Soft and supple.  CARDIOVASCULAR:  S1/S2 is normal, regular.  There is no S3/S4.  No murmurs  are heard.  No JVD seen.  EXTREMITIES:  There is 1+ pitting pedal edema bilaterally extending up to  the knees.  Peripheral pulses are palpable.  RESPIRATORY:  Lungs are clear to auscultation bilaterally with no rales,  rhonchi, or wheezes.  ABDOMEN:  Obese, nontender, nondistended.  Bowel sounds are heard.  No  organomegaly is appreciated.  NEUROLOGIC;  The patient is alert and awake, oriented x3 with no gross  neurological deficits.   LABORATORY DATA:  Her white count is 5.3 with normal differential.  Her  hemoglobin is 12.1 with MCV of 95.3.  Platelet count is 227.  Her  coagulation profile is completely normal.   Her chemistry shows a potassium of 3.3, glucose of 219, BUN and creatinine  within normal limits.  Calcium is 9.6.   Cardiac markers:  There are two negative troponins and two negative CK-MBs.   EKG:  Shows sinus rhythm with normal axis.  PR interval is normal.  QT interval is normal.  There is T-wave inversion seen in I, aVL, V5,  V6, as  well as in leads II and aVF.  There is no ST depression or ST elevation seen  in any of the leads.   Chest x-ray did not show any abnormality.   IMPRESSION:  This is a 65 year old, African-American female with multiple  risk factors for having coronary disease who presents with chest pain in her  left side of her chest on and off for about 4-5 days approximately.  She  does have some EKG changes.  We do not have any old EKG to compare with, so  it is unclear as to whether these changes are new or old; however, we will  repeat another EKG currently and compare it to this one.   PLAN:  1.  We will cycle cardiac enzymes for this patient to rule out MI.  We will      start the patient on heparin infusion for unstable angina protocol.  The      patient will be continued on the nitroglycerin drip for her chest pain.      We will start the patient on a low dose beta blocker.  Continue the      patient on aspirin, Lipitor, verapamil, and Cozaar.  We will also      consult [facility] to see the patient in the morning.  2.  Regarding her diabetes we will give the patient 20 units of subcu      Humulin 70/30 subcutaneously x1 now; however, we will hold subsequent      doses of her insulin as we will keep her n.p.o. past midnight.  We will      also hold her Glucophage in case the patient needs to undergo an      angiogram.  We will cover the patient with sliding scale insulin and      obtain CBGs q.a.c. and bedtime .  Further management decisions will be      made upon results of initial blood tests.  The patient will be admitted      to telemetry.      GK/MEDQ  D:  05/09/2004  T:  05/09/2004  Job:  [id number]   cc:   Annia Friendly. Loleta Chance, MD  [address]  [place]  [place] [address]  Fax: [phone]

## 2010-07-23 NOTE — Procedures (Signed)
[name], [name] NO.:  [id number]   MEDICAL RECORD NO.:  [id number]          PATIENT TYPE:  INP   LOCATION:  A216                          FACILITY:  [facility]   PHYSICIAN:  Vida Roller, M.D.   DATE OF BIRTH:  07-28-1945   DATE OF PROCEDURE:  05/10/2004  DATE OF DISCHARGE:                                  ECHOCARDIOGRAM   PRIMARY CARE PHYSICIAN:  Osvaldo Shipper, MD, [facility], [facility].   DESCRIPTION OF PROCEDURE:  May 10, 2004.  Tape #[id number] tape count 1522 through 1953.   INDICATIONS FOR PROCEDURE:  This is a 65 year old woman with chest  discomfort.   DESCRIPTION OF PROCEDURE:  Technical quality of the study is adequate.   MO TRACINGS:  1.  The aorta is 33 mm.  2.  The left atrium is 34 mm.  3.  The septum is 16 mm.  4.  The posterior wall is 15 mm.  5.  Left ventricular diastolic dimension 40 mm.  6.  Left ventricular systolic dimension 26 mm.   TWO-D AND DOPPLER IMAGING:  The left ventricle is normal size with normal systolic function.  Estimated  ejection fraction 65 to 70%. There are on obvious wall motion abnormalities.  There is moderate concentric left ventricular hypertrophy.   The right ventricle appears to be normal size with normal systolic function.   Both atria appear to be top normal size.   There is no aortic stenosis or regurgitation.   There is trace mitral regurgitation.   There is trace tricuspid regurgitation.   There is a prominent pericardial fat pad but no obvious effusion is seen.   The inferior vena cava was not well seen but in one view appears to be  normal size.   The ascending aorta was not well seen.      JH/MEDQ  D:  05/10/2004  T:  05/10/2004  Job:  [id number]

## 2010-07-23 NOTE — Cardiovascular Report (Signed)
NAMESUSANNE, [name]            ACCOUNT NO.:  [id number]   MEDICAL RECORD NO.:  [id number]          PATIENT TYPE:  OIB   LOCATION:  1962                         FACILITY:  [facility]   PHYSICIAN:  Everardo Beals. Juanda Chance, MD, FACCDATE OF BIRTH:  22-Sep-1945   DATE OF PROCEDURE:  04/04/2006  DATE OF DISCHARGE:                            CARDIAC CATHETERIZATION   CLINICAL HISTORY:  [name] is a 65 year old woman who has a history  of primarily nonobstructive coronary disease with an 80% lesion in the  distal LAD.  She also has insulin dependent diabetes.  Dr. Daleen Squibb saw her  recently and did a Myoview scan which showed anterior ischemia which was  somewhat more pronounced than her previous scan and arranged for her to  come in today for outpatient catheterization.  She has had no chest  pain.   PROCEDURE:  The procedure was performed via the right femoral arteries  and arterial sheath and 4 French preformed coronary catheters.  A front  wall arterial puncture was performed and Omnipaque contrast was used.  The patient tolerated the procedure well and left the laboratory in  satisfactory condition.   RESULTS:  Left main coronary artery:  The left main coronary artery was  free of disease.   Left anterior descending artery:  Left anterior descending artery was  diffusely irregular and diffusely diseased.  There was 40% proximal,  tandem 70% mid, and 80% distal stenoses in the LAD.  First diagonal  branch which was a moderately large vessel had an 80% of proximal  stenosis.   Circumflex artery:  The circumflex artery gave rise to a ramus branch, a  small marginal branch and two posterolateral branches.  There was a long  area of segmental disease with focal areas of 90 and 80% stenosis in the  proximal to mid circumflex artery.   Right coronary artery:  The right coronary artery is a moderate-sized  vessel that gave rise to conus branch, a right ventricular branch,  posterior descending  branch and a small posterolateral branch.  There is  40% narrowing in the mid-right coronary artery.   Left ventriculogram:  Left ventriculogram performed in the RAO  projection showed good wall motion with no areas of hypokinesis.  The  estimated fraction 60%.  There appeared to be LVH.  Aortic pressure was  190/75 with a mean of 121, left ventricular pressure was 190/15.   CONCLUSION:  Coronary artery disease with diffuse disease in the left  anterior descending with 40% proximal, 70% mid and 80% distal stenosis  and 80% stenosis in the first diagonal branch, 90 and 80% stenosis in  the proximal to mid circumflex artery and 40% narrowing in the mid right  coronary was normal left ventricular function.   RECOMMENDATIONS:  The patient is asymptomatic but has an abnormal  Myoview scan.  The LAD lesions do not appear to be critical and the  distal lesions appear to be more pronounced than the proximal lesions.  The circumflex lesion is quite tight.  I think the best option would be  intervention on the circumflex artery.  I discussed  this  with Dr. Daleen Squibb.  The LAD has diffuse disease but not critically  diseased and the right coronary artery is free of major obstruction.  Will discuss these options with the patient and plan intervention later  this week or next week if she is agreeable.      Bruce Elvera Lennox Juanda Chance, MD, [facility]  Electronically Signed     BRB/MEDQ  D:  04/04/2006  T:  04/04/2006  Job:  [id number]   cc:   Thomas C. Daleen Squibb, MD, [facility]  Annia Friendly. Loleta Chance, MD  Cardiopulmonary Lab

## 2010-12-02 LAB — URINALYSIS, ROUTINE W REFLEX MICROSCOPIC
Leukocytes, UA: NEGATIVE
Nitrite: NEGATIVE
Urobilinogen, UA: 0.2
pH: 5.5

## 2010-12-02 LAB — DIFFERENTIAL
Basophils Absolute: 0
Basophils Relative: 1
Eosinophils Absolute: 0
Lymphs Abs: 1.4
Monocytes Relative: 7
Neutro Abs: 3.9
Neutrophils Relative %: 67

## 2010-12-02 LAB — COMPREHENSIVE METABOLIC PANEL
ALT: 17
AST: 28
Albumin: 4.3
Alkaline Phosphatase: 88
CO2: 27
GFR calc non Af Amer: 33 — ABNORMAL LOW
Total Bilirubin: 0.7

## 2010-12-02 LAB — URINE MICROSCOPIC-ADD ON

## 2010-12-02 LAB — CBC
HCT: 33.2 — ABNORMAL LOW
Platelets: 187
RDW: 14.4
WBC: 5.8

## 2011-01-20 ENCOUNTER — Encounter: Payer: Self-pay | Admitting: *Deleted

## 2011-01-21 ENCOUNTER — Encounter: Payer: Self-pay | Admitting: Cardiology

## 2011-01-21 ENCOUNTER — Ambulatory Visit (INDEPENDENT_AMBULATORY_CARE_PROVIDER_SITE_OTHER): Payer: PRIVATE HEALTH INSURANCE | Admitting: Cardiology

## 2011-01-21 VITALS — BP 140/70 | HR 79 | Ht 70.0 in | Wt 205.0 lb

## 2011-01-21 DIAGNOSIS — E118 Type 2 diabetes mellitus with unspecified complications: Secondary | ICD-10-CM

## 2011-01-21 DIAGNOSIS — E663 Overweight: Secondary | ICD-10-CM

## 2011-01-21 DIAGNOSIS — I2510 Atherosclerotic heart disease of native coronary artery without angina pectoris: Secondary | ICD-10-CM

## 2011-01-21 DIAGNOSIS — E785 Hyperlipidemia, unspecified: Secondary | ICD-10-CM

## 2011-01-21 DIAGNOSIS — E1122 Type 2 diabetes mellitus with diabetic chronic kidney disease: Secondary | ICD-10-CM | POA: Insufficient documentation

## 2011-01-21 DIAGNOSIS — E119 Type 2 diabetes mellitus without complications: Secondary | ICD-10-CM

## 2011-01-21 DIAGNOSIS — I10 Essential (primary) hypertension: Secondary | ICD-10-CM

## 2011-01-21 DIAGNOSIS — R011 Cardiac murmur, unspecified: Secondary | ICD-10-CM

## 2011-01-21 NOTE — Progress Notes (Signed)
HPI Mrs. [name] comes in today for evaluation and management of her coronary disease.  She's having no angina ischemic events. He very compliant with her medications.  She states her blood pressures are controlled. Her blood work is followed by primary care with Dr. Loleta Chance.  Her weight is stable.  She still drives a schoolbus.  Denies any palpitations except on rare occasion. She has had no presyncope or syncope. She denies any dyspnea exertion, orthopnea, PND or edema.  Past Medical History  Diagnosis Date  . Hypertension   . Hyperlipidemia   . Diabetes mellitus   . Obesity   . GERD (gastroesophageal reflux disease)   . Heart murmur, systolic   . Anxiety   . Constipation   . Overactive bladder   . Allergic rhinitis   . Low back pain   . Osteoarthritis   . History of hysterectomy     Current Outpatient Prescriptions  Medication Sig Dispense Refill  . ALPRAZolam (XANAX) 0.5 MG tablet Take 0.5 mg by mouth at bedtime as needed.        Marland Kitchen aspirin 325 MG tablet Take 325 mg by mouth daily.        . clopidogrel (PLAVIX) 75 MG tablet Take 75 mg by mouth daily.        . insulin glargine (LANTUS) 100 UNIT/ML injection Inject into the skin as directed.        . insulin lispro protamine-insulin lispro (HUMALOG 50/50) (50-50) 100 UNIT/ML SUSP Inject into the skin. sliding scale       . metFORMIN (GLUCOPHAGE) 1000 MG tablet Take 1,000 mg by mouth 2 (two) times daily with a meal.        . Olmesartan-Amlodipine-HCTZ (TRIBENZOR) 40-10-25 MG TABS Take 1 tablet by mouth daily.        . Omega-3 Fatty Acids (OMEGA-3 PLUS PO) Take by mouth daily.        Marland Kitchen oxybutynin (DITROPAN) 5 MG tablet Take 5 mg by mouth daily.        . rosuvastatin (CRESTOR) 20 MG tablet Take 20 mg by mouth daily.        Marland Kitchen torsemide (DEMADEX) 20 MG tablet Take 20 mg by mouth 2 (two) times daily.        . verapamil (CALAN-SR) 240 MG CR tablet Take 240 mg by mouth 2 (two) times daily.          Allergies  Allergen Reactions    . Ace Inhibitors   . Codeine Rash    Family History  Problem Relation Age of Onset  . Diabetes Father   . Hypertension Father   . Stroke Father   . Hypertension Brother   . Aneurysm Brother   . Diabetes Brother     History   Social History  . Marital Status: Married    Spouse Name: N/A    Number of Children: N/A  . Years of Education: N/A   Occupational History  . Not on file.   Social History Main Topics  . Smoking status: Never Smoker   . Smokeless tobacco: Not on file  . Alcohol Use: No  . Drug Use: No  . Sexually Active: Not on file   Other Topics Concern  . Not on file   Social History Narrative  . No narrative on file    ROS ALL NEGATIVE EXCEPT THOSE NOTED IN HPI  PE  General Appearance: well developed, well nourished in no acute distress, at this HEENT: symmetrical face, PERRLA, good dentition  Neck: no JVD, thyromegaly, or adenopathy, trachea midline Chest: symmetric without deformity Cardiac: PMI non-displaced, RRR, normal S1, S2, no gallop, there is a 2/6 systolic murmur left upper sternal border, S2 splits Lung: clear to ausculation and percussion Vascular: all pulses full without bruits  Abdominal: nondistended, nontender, good bowel sounds, no HSM, no bruits Extremities: no cyanosis, clubbing or edema, no sign of DVT, no varicosities  Skin: normal color, no rashes Neuro: alert and oriented x 3, non-focal Pysch: normal affect  EKG Normal sinus rhythm, nonspecific ST T wave changes., no acute changes BMET    Component Value Date/Time   NA 132* 08/23/2007 2115   K 4.0 08/23/2007 2115   CL 92* 08/23/2007 2115   CO2 27 08/23/2007 2115   GLUCOSE 500* 08/23/2007 2115   BUN 26* 08/23/2007 2115   CREATININE 1.58* 08/23/2007 2115   CALCIUM 9.9 08/23/2007 2115   GFRNONAA 33* 08/23/2007 2115   GFRAA  Value: 40        The eGFR has been calculated using the MDRD equation. This calculation has not been validated in all clinical* 08/23/2007 2115    Lipid  Panel  No results found for this basename: chol, trig, hdl, cholhdl, vldl, ldlcalc    CBC    Component Value Date/Time   WBC 5.8 08/23/2007 2115   RBC 3.36* 08/23/2007 2115   HGB 11.1* 08/23/2007 2115   HCT 33.2* 08/23/2007 2115   PLT 187 08/23/2007 2115   MCV 98.8 08/23/2007 2115   MCHC 33.6 08/23/2007 2115   RDW 14.4 08/23/2007 2115   LYMPHSABS 1.4 08/23/2007 2115   MONOABS 0.4 08/23/2007 2115   EOSABS 0.0 08/23/2007 2115   BASOSABS 0.0 08/23/2007 2115

## 2011-01-21 NOTE — Patient Instructions (Signed)
Your physician recommends that you continue on your current medications as directed. Please refer to the Current Medication list given to you today.  Your physician wants you to follow-up in: 1 year. You will receive a reminder letter in the mail two months in advance. If you don't receive a letter, please call our office to schedule the follow-up appointment.  

## 2011-01-21 NOTE — Assessment & Plan Note (Signed)
Stable. Continue aggressive risk factor modification and secondary prevention. Patient given fresh nitroglycerin with instructions on use.

## 2011-01-21 NOTE — Assessment & Plan Note (Signed)
Echocardiogram July 2009 demonstrated mild aortic valve thickening. She has normal left ventricular function. Nothing by history or exam suggest any significant aortic stenosis.

## 2011-04-14 ENCOUNTER — Other Ambulatory Visit: Payer: Self-pay | Admitting: Cardiology

## 2011-08-10 ENCOUNTER — Other Ambulatory Visit: Payer: Self-pay | Admitting: Cardiology

## 2011-08-10 MED ORDER — CLOPIDOGREL BISULFATE 75 MG PO TABS: 75.0000 mg | ORAL_TABLET | Freq: Every day | ORAL | Status: DC

## 2011-08-12 ENCOUNTER — Other Ambulatory Visit: Payer: Self-pay | Admitting: Cardiology

## 2011-08-12 MED ORDER — CLOPIDOGREL BISULFATE 75 MG PO TABS: 75.0000 mg | ORAL_TABLET | Freq: Every day | ORAL | Status: DC

## 2011-09-16 ENCOUNTER — Other Ambulatory Visit: Payer: Self-pay | Admitting: Nephrology

## 2011-09-16 DIAGNOSIS — I10 Essential (primary) hypertension: Secondary | ICD-10-CM

## 2011-09-16 DIAGNOSIS — E139 Other specified diabetes mellitus without complications: Secondary | ICD-10-CM

## 2011-09-16 DIAGNOSIS — N182 Chronic kidney disease, stage 2 (mild): Secondary | ICD-10-CM

## 2011-09-17 ENCOUNTER — Other Ambulatory Visit: Payer: Self-pay | Admitting: Urology

## 2011-09-20 ENCOUNTER — Ambulatory Visit
Admission: RE | Admit: 2011-09-20 | Discharge: 2011-09-20 | Disposition: A | Discharge status: Home / Self Care | Payer: BC Managed Care – PPO | Source: Ambulatory Visit | Attending: Nephrology | Admitting: Nephrology

## 2011-09-20 DIAGNOSIS — E139 Other specified diabetes mellitus without complications: Secondary | ICD-10-CM

## 2011-09-20 DIAGNOSIS — N182 Chronic kidney disease, stage 2 (mild): Secondary | ICD-10-CM

## 2011-09-20 DIAGNOSIS — I10 Essential (primary) hypertension: Secondary | ICD-10-CM

## 2012-01-03 ENCOUNTER — Encounter: Payer: Self-pay | Admitting: Cardiology

## 2012-01-19 ENCOUNTER — Ambulatory Visit: Payer: BC Managed Care – PPO | Admitting: Cardiology

## 2012-01-20 ENCOUNTER — Ambulatory Visit (INDEPENDENT_AMBULATORY_CARE_PROVIDER_SITE_OTHER): Payer: BC Managed Care – PPO | Admitting: Cardiology

## 2012-01-20 ENCOUNTER — Encounter: Payer: Self-pay | Admitting: Cardiology

## 2012-01-20 VITALS — BP 139/69 | HR 67 | Ht 71.0 in | Wt 205.0 lb

## 2012-01-20 DIAGNOSIS — E118 Type 2 diabetes mellitus with unspecified complications: Secondary | ICD-10-CM

## 2012-01-20 DIAGNOSIS — I2510 Atherosclerotic heart disease of native coronary artery without angina pectoris: Secondary | ICD-10-CM

## 2012-01-20 DIAGNOSIS — I10 Essential (primary) hypertension: Secondary | ICD-10-CM

## 2012-01-20 DIAGNOSIS — R609 Edema, unspecified: Secondary | ICD-10-CM

## 2012-01-20 DIAGNOSIS — E663 Overweight: Secondary | ICD-10-CM

## 2012-01-20 DIAGNOSIS — R600 Localized edema: Secondary | ICD-10-CM | POA: Insufficient documentation

## 2012-01-20 DIAGNOSIS — J810 Acute pulmonary edema: Secondary | ICD-10-CM | POA: Insufficient documentation

## 2012-01-20 DIAGNOSIS — R011 Cardiac murmur, unspecified: Secondary | ICD-10-CM

## 2012-01-20 MED ORDER — METOPROLOL TARTRATE 50 MG PO TABS: 50.0000 mg | ORAL_TABLET | Freq: Two times a day (BID) | ORAL | Status: DC

## 2012-01-20 MED ORDER — OLMESARTAN-AMLODIPINE-HCTZ 40-10-25 MG PO TABS: 1.0000 | ORAL_TABLET | Freq: Every day | ORAL | Status: DC

## 2012-01-20 NOTE — Assessment & Plan Note (Signed)
Under better control than usual. Follow blood pressure and heart rate checked after switched to beta blocker.

## 2012-01-20 NOTE — Assessment & Plan Note (Signed)
We'll check echocardiogram to rule out aortic stenosis.

## 2012-01-20 NOTE — Progress Notes (Signed)
HPI [name] returns the office today for evaluation and management of increasing edema. She takes Demadex 20 mg by mouth twice a day. She says her blood pressures been under pretty good control but is oftentimes elevated here. She seems to be fairly compliant with sodium.  She denies orthopnea, PND. She denies any increased dyspnea on exertion. She has not lost any weight.  Her last echocardiogram several years ago showed mild aortic valve calcification with no stenosis.  Looking at her med list she is on 2 calcium channel blockers.  Past Medical History  Diagnosis Date  . Hypertension   . Hyperlipidemia   . Diabetes mellitus   . Obesity   . GERD (gastroesophageal reflux disease)   . Heart murmur, systolic   . Anxiety   . Constipation   . Overactive bladder   . Allergic rhinitis   . Low back pain   . Osteoarthritis   . History of hysterectomy     Current Outpatient Prescriptions  Medication Sig Dispense Refill  . ALPRAZolam (XANAX) 0.5 MG tablet Take 0.5 mg by mouth at bedtime as needed.        Marland Kitchen aspirin 325 MG tablet Take 325 mg by mouth daily.        . benzonatate (TESSALON) 200 MG capsule AS NEEDED      . clopidogrel (PLAVIX) 75 MG tablet Take 1 tablet (75 mg total) by mouth daily.  90 tablet  1  . famotidine (PEPCID) 20 MG tablet 1 TAB DAILY      . insulin glargine (LANTUS) 100 UNIT/ML injection Inject into the skin as directed.        . Insulin Regular Human (NOVOLIN R PENFILL IJ) Inject as directed. NOVOLOG PEN SLIDING SCALE      . latanoprost (XALATAN) 0.005 % ophthalmic solution BOTH EYES 1 DROP AT BEDTIME      . meclizine (ANTIVERT) 12.5 MG tablet AS NEEDED FOR DIZZINESS      . metFORMIN (GLUCOPHAGE) 1000 MG tablet Take 1,000 mg by mouth 2 (two) times daily with a meal.        . Olmesartan-Amlodipine-HCTZ (TRIBENZOR) 40-10-25 MG TABS Take 1 tablet by mouth daily.  30 tablet    . Omega-3 Fatty Acids (OMEGA-3 PLUS PO) Take by mouth daily.        . rosuvastatin  (CRESTOR) 20 MG tablet Take 20 mg by mouth daily.        Marland Kitchen torsemide (DEMADEX) 20 MG tablet Take 20 mg by mouth 2 (two) times daily.        Marland Kitchen trimethoprim (TRIMPEX) 100 MG tablet 1 TAB DAILY      . metoprolol (LOPRESSOR) 50 MG tablet Take 1 tablet (50 mg total) by mouth 2 (two) times daily.  180 tablet  3    Allergies  Allergen Reactions  . Ace Inhibitors   . Codeine Rash    Family History  Problem Relation Age of Onset  . Diabetes Father   . Hypertension Father   . Stroke Father   . Hypertension Brother   . Aneurysm Brother   . Diabetes Brother     History   Social History  . Marital Status: Married    Spouse Name: N/A    Number of Children: N/A  . Years of Education: N/A   Occupational History  . Not on file.   Social History Main Topics  . Smoking status: Never Smoker   . Smokeless tobacco: Not on file  . Alcohol Use: No  .  Drug Use: No  . Sexually Active: Not on file   Other Topics Concern  . Not on file   Social History Narrative  . No narrative on file    ROS ALL NEGATIVE EXCEPT THOSE NOTED IN HPI  PE  General Appearance: well developed, well nourished in no acute distress, obese HEENT: symmetrical face, PERRLA, good dentition  Neck: no JVD, thyromegaly, or adenopathy, trachea midline Chest: symmetric without deformity Cardiac: PMI non-displaced, RRR, normal S1, S2, no gallop, 2/6 systolic murmur. Lung: clear to ausculation and percussion Vascular: all pulses full without bruits  Abdominal: nondistended, nontender, good bowel sounds, no HSM, no bruits Extremities: no cyanosis, clubbing , no sign of DVT, no varicosities, 2+ pitting edema at the lower legs and ankles.  Skin: normal color, no rashes Neuro: alert and oriented x 3, non-focal Pysch: normal affect  EKG Normal sinus rhythm with first-degree block, ST segment changes laterally more prominent than before consistent with LVH and strain.  BMET    Component Value Date/Time   NA 132*  08/23/2007 2115   K 4.0 08/23/2007 2115   CL 92* 08/23/2007 2115   CO2 27 08/23/2007 2115   GLUCOSE 500* 08/23/2007 2115   BUN 26* 08/23/2007 2115   CREATININE 1.58* 08/23/2007 2115   CALCIUM 9.9 08/23/2007 2115   GFRNONAA 33* 08/23/2007 2115   GFRAA  Value: 40        The eGFR has been calculated using the MDRD equation. This calculation has not been validated in all clinical* 08/23/2007 2115    Lipid Panel  No results found for this basename: chol, trig, hdl, cholhdl, vldl, ldlcalc    CBC    Component Value Date/Time   WBC 5.8 08/23/2007 2115   RBC 3.36* 08/23/2007 2115   HGB 11.1* 08/23/2007 2115   HCT 33.2* 08/23/2007 2115   PLT 187 08/23/2007 2115   MCV 98.8 08/23/2007 2115   MCHC 33.6 08/23/2007 2115   RDW 14.4 08/23/2007 2115   LYMPHSABS 1.4 08/23/2007 2115   MONOABS 0.4 08/23/2007 2115   EOSABS 0.0 08/23/2007 2115   BASOSABS 0.0 08/23/2007 2115

## 2012-01-20 NOTE — Assessment & Plan Note (Signed)
Stable. Continue secondary preventative therapy. 

## 2012-01-20 NOTE — Patient Instructions (Addendum)
Your physician has requested that you have an echocardiogram . Echocardiography is a painless test that uses sound waves to create images of your heart. It provides your doctor with information about the size and shape of your heart and how well your heart's chambers and valves are working. This procedure takes approximately one hour. There are no restrictions for this procedure.  Same day as your ECHO have a blood pressure check in the nurse room  Stop taking Verapamil  Start taking Metoprolol Tartrate 50mg  1 tablet twice a day.  Your physician wants you to follow-up in: 1 year with Dr. Daleen Squibb. You will receive a reminder letter in the mail two months in advance. If you don't  receive a letter, please call our office to schedule the follow-up appointment.

## 2012-01-20 NOTE — Assessment & Plan Note (Signed)
Encouraged to lose weight

## 2012-01-20 NOTE — Assessment & Plan Note (Signed)
This is multifactorial. She is difficult to control hypertension and probably has significant diastolic dysfunction. She's also significantly overweight. We need to rule out progressive aortic valve disease. She's also to calcium channel blockers. We'll discontinue verapamil today and place her on metoprolol tartrate 50 mg twice a day. Return for an echocardiogram to evaluate her LV function and aortic valve. We'll check her blood pressure and heart rate that day. Salt restriction reinforced.

## 2012-02-03 ENCOUNTER — Ambulatory Visit (INDEPENDENT_AMBULATORY_CARE_PROVIDER_SITE_OTHER): Payer: BC Managed Care – PPO | Admitting: *Deleted

## 2012-02-03 ENCOUNTER — Ambulatory Visit (HOSPITAL_COMMUNITY): Payer: BC Managed Care – PPO | Attending: Cardiology | Admitting: Radiology

## 2012-02-03 VITALS — BP 118/60 | HR 67 | Ht 71.0 in | Wt 205.0 lb

## 2012-02-03 DIAGNOSIS — R011 Cardiac murmur, unspecified: Secondary | ICD-10-CM

## 2012-02-03 DIAGNOSIS — E785 Hyperlipidemia, unspecified: Secondary | ICD-10-CM | POA: Insufficient documentation

## 2012-02-03 DIAGNOSIS — I10 Essential (primary) hypertension: Secondary | ICD-10-CM

## 2012-02-03 DIAGNOSIS — E669 Obesity, unspecified: Secondary | ICD-10-CM | POA: Insufficient documentation

## 2012-02-03 DIAGNOSIS — M7989 Other specified soft tissue disorders: Secondary | ICD-10-CM | POA: Insufficient documentation

## 2012-02-03 DIAGNOSIS — E119 Type 2 diabetes mellitus without complications: Secondary | ICD-10-CM | POA: Insufficient documentation

## 2012-02-03 NOTE — Progress Notes (Signed)
Echocardiogram performed.  

## 2012-02-10 ENCOUNTER — Telehealth: Payer: Self-pay | Admitting: Cardiology

## 2012-02-10 NOTE — Telephone Encounter (Signed)
Pt rtn call, would like echo results [phone] ok to leave message or leave with dtr

## 2012-02-10 NOTE — Telephone Encounter (Signed)
F/U   Returning call back to nurse.   

## 2012-02-10 NOTE — Telephone Encounter (Signed)
Pt aware of echo results. States her blood pressure has been running 118/66 and she is feeling well. Mylo Red RN\

## 2012-04-21 ENCOUNTER — Other Ambulatory Visit: Payer: Self-pay | Admitting: Cardiology

## 2012-08-03 ENCOUNTER — Ambulatory Visit (INDEPENDENT_AMBULATORY_CARE_PROVIDER_SITE_OTHER): Payer: BC Managed Care – PPO | Admitting: Internal Medicine

## 2012-08-03 ENCOUNTER — Encounter: Payer: Self-pay | Admitting: Internal Medicine

## 2012-08-03 VITALS — BP 118/70 | HR 73 | Temp 98.4°F | Resp 10 | Ht 68.75 in | Wt 208.0 lb

## 2012-08-03 DIAGNOSIS — E118 Type 2 diabetes mellitus with unspecified complications: Secondary | ICD-10-CM

## 2012-08-03 MED ORDER — INSULIN ASPART 100 UNIT/ML ~~LOC~~ SOLN: SUBCUTANEOUS | Status: DC

## 2012-08-03 MED ORDER — METFORMIN HCL 1000 MG PO TABS: 500.0000 mg | ORAL_TABLET | Freq: Two times a day (BID) | ORAL | Status: DC

## 2012-08-03 MED ORDER — INSULIN GLARGINE 100 UNIT/ML ~~LOC~~ SOLN: SUBCUTANEOUS | Status: DC

## 2012-08-03 NOTE — Patient Instructions (Addendum)
Please decrease the dose of Lantus to 35 units. Please take the following mealtime insulin: - 8 units with every meal Add the following sliding scale NovoLog correction: - 150-175: + 2 units - 176-200: + 3 units - 201-225: + 4 units - 226-250: + 5 units - 251-275: + 6 units - 276-300: + 7 units - >301: + 8 units Decrease Metformin to 500 mg 2x daily. Please return in 1 month with your sugar log.

## 2012-08-03 NOTE — Progress Notes (Signed)
Patient ID: [name], female   DOB: 1945-11-21, 67 y.o.   MRN: [id number]  HPI: [name] is a 67 y.o.-year-old female, referred by her PCP, Dr.Hill, for management of DM2, insulin-dependent, uncontrolled, with complications (Peripheral neuropathy, CKD 4, CAD, s/p stent 2008).  Patient has been diagnosed with diabetes in 1984; had gestational DM in 1979. she has not started on insulin soon after. Last hemoglobin A1c was: ~10% - we asked for records from PCP during the appt, pending now.  Pt is on a regimen of: - Metformin 1000 mg po bid - Lantus 45 units qhs - Novolog SSI units tid ac: 80-129: 3 units 130-150: 5 units 151-200: 8 units 201-250: 12 units 251-300: 14 units 301-350: 18 units 351-400: 20 units 401-500: 25 units She has lows at night, approx. 2x a week, wake her up if <50. She needs to get OJ.  Pt checks her sugars 4x a day (does not bring a log) and they are: - am: fluctuating: 150-260, can go up to 300s. Once 97, once 49, once 57. - before lunch: 120-250 - before dinner: 180-300 - bedtime: 200s No lows. Lowest sugar was 49 - at least 2x a week, at night; she has hypoglycemia awareness at 50 - since this is happening at night. Highest sugar was HI - not often  Pt's meals are: - Breakfast: boiled egg, [place] bacon, coffee - Lunch: salad + sandwich - Dinner: baked chicken, roll, green beans - Snacks: 2  Pt does not have chronic kidney disease, last BUN/creatinine was BUN: 37 (6-23), Cr 1.63 (0.5-1.1). She sees [facility] - Dr. Eliott Nine.   Pt's last eye exam was in 05/2012. She had cataracts removed. No DR (?). Has occas. numbness and tingling in her legs.  Pt has FH of DM in aunt, daughter, father, brother.  ROS: Constitutional: no weight gain/loss, + fatigue, no subjective hyperthermia/hypothermia, poor sleep, nocturia, excessive urination Eyes: no blurry vision, no xerophthalmia ENT: no sore throat, no nodules palpated in throat, no  dysphagia/odynophagia, no hoarseness Cardiovascular: no CP/SOB/+ palpitations/+ leg swelling Respiratory: + cough/SOB Gastrointestinal: no N/V/D/C Musculoskeletal: + muscle aches/joint aches Skin: no rashes; + easy bruising, excessive hair growth Neurological: no tremors/numbness/tingling/dizziness Psychiatric: no depression/anxiety Low libido  Past Medical History  Diagnosis Date  . Hypertension   . Hyperlipidemia   . Diabetes mellitus   . Obesity   . GERD (gastroesophageal reflux disease)   . Heart murmur, systolic   . Anxiety   . Constipation   . Overactive bladder   . Allergic rhinitis   . Low back pain   . Osteoarthritis   . History of hysterectomy    Past Surgical History  Procedure Laterality Date  . Carpal tunnel release    . Trigger finger release    . Cervical biopsy      cervical lymph node biopsies  . Back surgery      multiple  . Coronary stent placement  04/11/2006    2 -- Taxus stents to the circumflex   . Cardiac catheterization  04/04/2006    Est EF of 60%   History   Social History  . Marital Status: Married    Spouse Name: N/A    Number of Children: 3   Occupational History  . retired   Social History Main Topics  . Smoking status: Never Smoker   . Smokeless tobacco: Not on file  . Alcohol Use: No  . Drug Use: No   Current Outpatient Prescriptions on  File Prior to Visit  Medication Sig Dispense Refill  . ALPRAZolam (XANAX) 0.5 MG tablet Take 0.5 mg by mouth at bedtime as needed.        Marland Kitchen aspirin 325 MG tablet Take 325 mg by mouth daily.        . benzonatate (TESSALON) 200 MG capsule AS NEEDED      . clopidogrel (PLAVIX) 75 MG tablet TAKE ONE TABLET BY MOUTH EVERY DAY  30 tablet  11  . famotidine (PEPCID) 20 MG tablet 1 TAB DAILY      . latanoprost (XALATAN) 0.005 % ophthalmic solution BOTH EYES 1 DROP AT BEDTIME      . meclizine (ANTIVERT) 12.5 MG tablet AS NEEDED FOR DIZZINESS      . metoprolol (LOPRESSOR) 50 MG tablet Take 1 tablet  (50 mg total) by mouth 2 (two) times daily.  180 tablet  3  . Olmesartan-Amlodipine-HCTZ (TRIBENZOR) 40-10-25 MG TABS Take 1 tablet by mouth daily.  30 tablet    . Omega-3 Fatty Acids (OMEGA-3 PLUS PO) Take by mouth daily.        . rosuvastatin (CRESTOR) 20 MG tablet Take 20 mg by mouth daily.        Marland Kitchen torsemide (DEMADEX) 20 MG tablet Take 20 mg by mouth 2 (two) times daily.        Marland Kitchen trimethoprim (TRIMPEX) 100 MG tablet 1 TAB DAILY       No current facility-administered medications on file prior to visit.   Allergies  Allergen Reactions  . Ace Inhibitors   . Codeine Rash   Family History  Problem Relation Age of Onset  . Diabetes Father   . Hypertension Father   . Stroke Father   . Hypertension Brother   . Aneurysm Brother   . Diabetes Brother    PE: BP 118/70  Pulse 73  Temp(Src) 98.4 F (36.9 C) (Oral)  Resp 10  Ht 5' 8.75" (1.746 m)  Wt 208 lb (94.348 kg)  BMI 30.95 kg/m2  SpO2 97% Wt Readings from Last 3 Encounters:  08/03/12 208 lb (94.348 kg)  02/03/12 205 lb (92.987 kg)  01/20/12 205 lb (92.987 kg)   Constitutional: overweight - protuberant abdomen, in NAD Eyes: PERRLA, EOMI, no exophthalmos ENT: moist mucous membranes, no thyromegaly, no cervical lymphadenopathy Cardiovascular: RRR, No MRG Respiratory: CTA B Gastrointestinal: abdomen soft, NT, ND, BS+ Musculoskeletal: no deformities, strength intact in all 4 Skin: moist, warm, no rashes Neurological: no tremor with outstretched hands, DTR normal in all 4  ASSESSMENT: 1. DM2, insulin-dependent, uncontrolled, with complications - Peripheral neuropathy - CKD 4 - Dr Eliott Nine - CAD, s/p stent 2008, Dr. Valera Castle. Last 2-D echo in 01/2012, EF 65-70%.  PLAN:  1.  Pt with long standing diabetes, with poor control, on a basal-bolus regimen.  - Since she is having lows at night, we will need to decrease the Lantus dose. Also, I would like her to have a more consistent NovoLog regimen, to cover her meals. We  discussed that a physiologic insulin regimen has equal TDD of basal and bolus insulins. Therefore, I gave her the following instructions and I advised her to check more often in the next couple of days to see if doses are adequate.  Please decrease the dose of Lantus to 35 units. Please take the following mealtime insulin: - 8 units with every meal Add the following sliding scale NovoLog correction: - 150-175: + 2 units - 176-200: + 3 units - 201-225: + 4 units -  226-250: + 5 units - 251-275: + 6 units - 276-300: + 7 units - >301: + 8 units - Due to her increased creatinine, at 1.6 per the last check on 07/13/2012, we had a long discussion about contraindications to metformin for a creatinine of 1.4, per guidelines. I explained the risks, which are very small, but present, for lactic acidosis, and these were especially with another medication in the same class (phenformin). The patient understood the risk and she would like to try a lower dose of metformin, acknowledging the risk for acidosis. I will decrease her dose to 500 mg twice a day (which I sent to her pharmacy). - given sugar log and advised how to fill it and to bring it at next appt -check 4x a day - given foot care handout and explained the principles - given instructions for hypoglycemia management "15-15 rule" - I will see her back  in 1 month with your sugar log.  - will check a foot exam and HbA1C at next visit.

## 2012-09-03 ENCOUNTER — Ambulatory Visit: Payer: BC Managed Care – PPO | Admitting: Internal Medicine

## 2012-09-14 ENCOUNTER — Encounter: Payer: Self-pay | Admitting: Internal Medicine

## 2012-09-14 ENCOUNTER — Ambulatory Visit (INDEPENDENT_AMBULATORY_CARE_PROVIDER_SITE_OTHER): Payer: Medicare Other | Admitting: Internal Medicine

## 2012-09-14 VITALS — BP 132/68 | HR 51 | Temp 98.8°F | Resp 12 | Ht 68.75 in | Wt 214.0 lb

## 2012-09-14 DIAGNOSIS — E118 Type 2 diabetes mellitus with unspecified complications: Secondary | ICD-10-CM

## 2012-09-14 MED ORDER — INSULIN ASPART 100 UNIT/ML ~~LOC~~ SOLN: SUBCUTANEOUS | Status: DC

## 2012-09-14 NOTE — Patient Instructions (Addendum)
Continue Lantus at 35 units at night. You can skip Novolog with breakfast, but use 10 units with lunch and dinner (inject 15 min before the meals). You can stop Januvia when you run out of it. Stay off Metformin. Return in 6 weeks with your sugar log.  Please consider the following ways to cut down carbs and fat and increase fiber and micronutrients in your diet:  - substitute whole grain for white bread or pasta - substitute brown rice for white rice - substitute 90-calorie flat bread pieces for slices of bread when possible - substitute sweet potatoes or yams for white potatoes - substitute humus for margarine - substitute tofu for cheese when possible - substitute almond or rice milk for regular milk (would not drink soy milk daily due to concern for soy estrogen influence on breast cancer risk) - substitute dark chocolate for other sweets when possible - substitute water - can add lemon or orange slices for taste - for diet sodas (artificial sweeteners will trick your body that you can eat sweets without getting calories and will lead you to overeating and weight gain in the long run) - do not skip breakfast or other meals (this will slow down the metabolism and will result in more weight gain over time)  - can try smoothies made from fruit and almond/rice milk in am instead of regular breakfast - can also try old-fashioned (not instant) oatmeal made with almond/rice milk in am - order the dressing on the side when eating salad at a restaurant (pour less than half of the dressing on the salad) - eat as little meat as possible - can try juicing, but should not forget that juicing will get rid of the fiber, so would alternate with eating raw veg./fruits or drinking smoothies - use as little oil as possible, even when using olive oil - can dress a salad with a mix of balsamic vinegar and lemon juice, for e.g. - use agave nectar, stevia sugar, or regular sugar rather than artificial  sweateners - steam or broil/roast veggies  - snack on veggies/fruit/nuts (unsalted, preferably) when possible, rather than processed foods - reduce or eliminate aspartame in diet (it is in diet sodas, chewing gum, etc) Read the labels!  Try to read Dr. Katherina Right book: "Program for Reversing Diabetes" for the vegan concept and other ideas for healthy eating.

## 2012-09-14 NOTE — Progress Notes (Signed)
Patient ID: [name], female   DOB: [birth year], 67 y.o.   MRN: [id number]  HPI: [name] is a 67 y.o.-year-old female, returning for f/u for DM2, dx 1984 (GDM in 1979) insulin-dependent, uncontrolled, with complications (Peripheral neuropathy, CKD 4, CAD, s/p stent 2008). Last visit 1.5 mo ago.  Last hemoglobin A1c was: ~10% 2 mo ago.  Her daughter was in the hospital 2 weeks ago and pt was frequently in the hospital with her.  Pt is on a regimen of: She was taken off Metformin 500 mg po bid by renal (Dr. Eliott Nine) ~ 1 mo ago >> sugars increased in am since then - started on Januvia 100 2 weeks ago by PCP - Lantus 35, decreased from 45 units qhs - Novolog mealtime 8 units - tid ac - most of the times she takes it, but lately, since she has been with her daughter in the hospital, did not take it consistently - + SSI: - 150-175: + 2 units - 176-200: + 3 units - 201-225: + 4 units - 226-250: + 5 units - 251-275: + 6 units - 276-300: + 7 units - >301: + 8 units She had lows at night, approx. 2x a week, wake her up if <50, but not anymore.  Pt checks her sugars 4x a day (does not bring a log) and they are fluctuating - in the 300s when not taking the mealtime insulin - am: fluctuating: 150-260, can go up to 300s. Once 97, once 49, once 57 >> now 138-250s - before lunch: 120-250 >> now 93-163 - before dinner: 180-300 >> now 150-200 - bedtime: 200s >> 150-200's, slightly higher than dinner No lows. Lowest sugar was 49 - at least 2x a week, at night; she has hypoglycemia awareness at 50 - since this is happening at night. Highest sugar was HI - not often  Pt's meals are: - Breakfast: boiled egg, [place] bacon, coffee - Lunch: salad + sandwich - Dinner: baked chicken, roll, green beans - Snacks: 2  Pt does have chronic kidney disease, last BUN/creatinine was BUN: 37 (6-23), Cr 1.63 (0.5-1.1). She sees [facility] - Dr. Eliott Nine.   Pt's last eye exam was in 07/09//2014.  No DR. She had cataracts removed earlier this year: 01 and 07/2012. Has occas. numbness and tingling in her legs.  I reviewed pt's medications, allergies, PMH, social hx, family hx and no changes required, except as mentioned above.  ROS: Constitutional: no weight gain/loss, + fatigue, no subjective hyperthermia/hypothermia Eyes: no blurry vision, no xerophthalmia ENT: no sore throat, no nodules palpated in throat, no dysphagia/odynophagia, no hoarseness Cardiovascular: no CP/SOB/palpitations/+ leg swelling Respiratory: + cough/SOB Gastrointestinal: no N/V/D/C Musculoskeletal: no muscle aches/joint aches Skin: no rashes;  Neurological: no tremors/numbness/tingling/dizziness Psychiatric: no depression/anxiety  PE: BP 132/68  Pulse 51  Temp(Src) 98.8 F (37.1 C) (Oral)  Resp 12  Ht 5' 8.75" (1.746 m)  Wt 214 lb (97.07 kg)  BMI 31.84 kg/m2  SpO2 98% Wt Readings from Last 3 Encounters:  09/14/12 214 lb (97.07 kg)  08/03/12 208 lb (94.348 kg)  02/03/12 205 lb (92.987 kg)   Constitutional: overweight - protuberant abdomen, in NAD Eyes: PERRLA, EOMI, no exophthalmos ENT: moist mucous membranes, no thyromegaly, no cervical lymphadenopathy Cardiovascular: RRR, No MRG Respiratory: CTA B Gastrointestinal: abdomen soft, NT, ND, BS+ Musculoskeletal: no deformities, strength intact in all 4 Skin: moist, warm, no rashes Neurological: no tremor with outstretched hands, DTR normal in all 4  ASSESSMENT: 1. DM2, insulin-dependent, uncontrolled,  with complications - Peripheral neuropathy - CKD 4 - Dr Eliott Nine - CAD, s/p stent 2008, Dr. Valera Castle. Last 2-D echo in 01/2012, EF 65-70%.  PLAN:  1.  Pt with long standing diabetes, with poor, but improved control, on a basal-bolus regimen. She can miss doses of her mealtime insulin, and mentions that her sugars can be in the 300s even id this happens. She missed more lately as she was accompanying her daughter in the hospital 2 weeks ago. She  does not bring a log, so it is difficult to evaluate her sugars in much detail.  - Her sugars are higher in am after stopping the Metformin per renal indication, but her bedtime sugars are also higher, so, I believe she needs more dinnertime insulin (will increase to 10 units). Since she triggers the SSI with most meals, will increase lunchtime Novolog, too, to 10 units. Since she eats almost no carbs for b'fast, we can skip mealtime insulin then.  - will continue Lantus at 35 units (she had lows at night before, on 45 units) - will continue the following NovoLog correction: - 150-175: + 2 units - 176-200: + 3 units - 201-225: + 4 units - 226-250: + 5 units - 251-275: + 6 units - 276-300: + 7 units - >301: + 8 units - given 2 Novolog pen samples - will continue off metformin - pt was tried on Januvia by PCP, but she did not see much change in her sugars (has been on it for 2 weeks). In the context of mealtime insulin requirement, the effect of Januvia is minimized. I think we can stop after she runs out of the Rx. -  given new sugar log and advised how to fill it and to bring it at next appt -check 4x a day - also call with sugars in 1 week or send them to me by MyChart - she asked me about some recs regarding a healthy diet. I recommended a mediterranean diet (given specific examples). Also given her a list of healthier substitutions (see Pt Instr.) and will refer to nutrition, too. - will check a foot exam and HbA1C at next visit (if not checked by Dr Loleta Chance)

## 2012-09-21 ENCOUNTER — Encounter: Payer: Self-pay | Admitting: Internal Medicine

## 2012-10-10 ENCOUNTER — Other Ambulatory Visit: Payer: Self-pay

## 2012-10-19 ENCOUNTER — Ambulatory Visit (INDEPENDENT_AMBULATORY_CARE_PROVIDER_SITE_OTHER): Payer: BC Managed Care – PPO | Admitting: Cardiology

## 2012-10-19 ENCOUNTER — Encounter: Payer: Self-pay | Admitting: Cardiology

## 2012-10-19 VITALS — BP 134/70 | HR 66 | Ht 69.0 in | Wt 216.0 lb

## 2012-10-19 DIAGNOSIS — E663 Overweight: Secondary | ICD-10-CM

## 2012-10-19 DIAGNOSIS — R011 Cardiac murmur, unspecified: Secondary | ICD-10-CM

## 2012-10-19 DIAGNOSIS — I2510 Atherosclerotic heart disease of native coronary artery without angina pectoris: Secondary | ICD-10-CM

## 2012-10-19 DIAGNOSIS — E118 Type 2 diabetes mellitus with unspecified complications: Secondary | ICD-10-CM

## 2012-10-19 DIAGNOSIS — E785 Hyperlipidemia, unspecified: Secondary | ICD-10-CM

## 2012-10-19 DIAGNOSIS — R600 Localized edema: Secondary | ICD-10-CM

## 2012-10-19 DIAGNOSIS — R609 Edema, unspecified: Secondary | ICD-10-CM

## 2012-10-19 NOTE — Assessment & Plan Note (Signed)
Stable. Continue secondary preventative therapy. I'll have her return in one year with Dr. Diona Browner.

## 2012-10-19 NOTE — Patient Instructions (Signed)
Your physician recommends that you schedule a follow-up appointment in: 1 YEAR WITH DR Reggy Eye will receive a reminder letter two months in advance reminding you to call and schedule your appointment. If you don't receive this letter, please contact our office.  Your physician recommends that you continue on your current medications as directed. Please refer to the Current Medication list given to you today.

## 2012-10-19 NOTE — Progress Notes (Signed)
HPI [name] comes in today for evaluation and management of her history of coronary artery disease and PCI of her circumflex in the remote past. She also has hypertension, hyperlipidemia and type 2 diabetes is being managed by primary care and endocrinologist. She thinks her last A1c was 8%.  She denies orthopnea, PND, angina or ischemic symptoms, or change in her chronic low-grade edema. Her weight has been fairly stable. She's very compliant with her medications.  Past Medical History  Diagnosis Date  . Hypertension   . Hyperlipidemia   . Diabetes mellitus   . Obesity   . GERD (gastroesophageal reflux disease)   . Heart murmur, systolic   . Anxiety   . Constipation   . Overactive bladder   . Allergic rhinitis   . Low back pain   . Osteoarthritis   . History of hysterectomy     Current Outpatient Prescriptions  Medication Sig Dispense Refill  . ALPRAZolam (XANAX) 0.5 MG tablet Take 0.5 mg by mouth at bedtime as needed.        Marland Kitchen aspirin 325 MG tablet Take 325 mg by mouth daily.        . benzonatate (TESSALON) 200 MG capsule AS NEEDED      . cefadroxil (DURICEF) 500 MG capsule       . clopidogrel (PLAVIX) 75 MG tablet TAKE ONE TABLET BY MOUTH EVERY DAY  30 tablet  11  . famotidine (PEPCID) 20 MG tablet 1 TAB DAILY      . insulin aspart (NOVOLOG) 100 UNIT/ML injection Inject  Under skin 10 units 2x a day + SSI = max 30 units a day  3 vial  PRN  . insulin glargine (LANTUS) 100 UNIT/ML injection Inject 35 units every night under skin  30 mL  3  . latanoprost (XALATAN) 0.005 % ophthalmic solution BOTH EYES 1 DROP AT BEDTIME      . meclizine (ANTIVERT) 12.5 MG tablet AS NEEDED FOR DIZZINESS      . metFORMIN (GLUCOPHAGE) 1000 MG tablet Take 0.5 tablets (500 mg total) by mouth 2 (two) times daily with a meal.  60 tablet  2  . metoprolol (LOPRESSOR) 50 MG tablet Take 1 tablet (50 mg total) by mouth 2 (two) times daily.  180 tablet  3  . Olmesartan-Amlodipine-HCTZ (TRIBENZOR) 40-10-25  MG TABS Take 1 tablet by mouth daily.  30 tablet    . Omega-3 Fatty Acids (FISH OIL TRIPLE STRENGTH PO) Take 1,240 mg by mouth.      . rosuvastatin (CRESTOR) 20 MG tablet Take 20 mg by mouth daily.        . sitaGLIPtin (JANUVIA) 50 MG tablet Take 50 mg by mouth daily.      Marland Kitchen torsemide (DEMADEX) 20 MG tablet Take 20 mg by mouth 2 (two) times daily.        Marland Kitchen trimethoprim (TRIMPEX) 100 MG tablet 1 TAB DAILY       No current facility-administered medications for this visit.    Allergies  Allergen Reactions  . Ace Inhibitors   . Codeine Rash    Family History  Problem Relation Age of Onset  . Diabetes Father   . Hypertension Father   . Stroke Father   . Hypertension Brother   . Aneurysm Brother   . Diabetes Brother     History   Social History  . Marital Status: Married    Spouse Name: N/A    Number of Children: N/A  . Years of Education: N/A  Occupational History  . Not on file.   Social History Main Topics  . Smoking status: Never Smoker   . Smokeless tobacco: Not on file  . Alcohol Use: No  . Drug Use: No  . Sexual Activity: Not on file   Other Topics Concern  . Not on file   Social History Narrative  . No narrative on file    ROS ALL NEGATIVE EXCEPT THOSE NOTED IN HPI  PE  General Appearance: well developed, well nourished in no acute distress, pleasant, obese HEENT: symmetrical face, PERRLA, good dentition  Neck: no JVD, thyromegaly, or adenopathy, trachea midline Chest: symmetric without deformity Cardiac: PMI poorly appreciated, RRR, normal S1, S2, no gallop or murmur Lung: clear to ausculation and percussion Vascular: all pulses full without bruits  Abdominal: nondistended, nontender, good bowel sounds, no HSM, no bruits Extremities: no cyanosis, clubbing, trace edema bilaterally, no sign of DVT, no varicosities  Skin: normal color, no rashes Neuro: alert and oriented x 3, non-focal Pysch: normal affect  EKG Normal sinus rhythm, nonspecific ST  segment changes particularly laterally. No change from previous ECG. BMET    Component Value Date/Time   NA 132* 08/23/2007 2115   K 4.0 08/23/2007 2115   CL 92* 08/23/2007 2115   CO2 27 08/23/2007 2115   GLUCOSE 500* 08/23/2007 2115   BUN 26* 08/23/2007 2115   CREATININE 1.58* 08/23/2007 2115   CALCIUM 9.9 08/23/2007 2115   GFRNONAA 33* 08/23/2007 2115   GFRAA  Value: 40        The eGFR has been calculated using the MDRD equation. This calculation has not been validated in all clinical* 08/23/2007 2115    Lipid Panel  No results found for this basename: chol, trig, hdl, cholhdl, vldl, ldlcalc    CBC    Component Value Date/Time   WBC 5.8 08/23/2007 2115   RBC 3.36* 08/23/2007 2115   HGB 11.1* 08/23/2007 2115   HCT 33.2* 08/23/2007 2115   PLT 187 08/23/2007 2115   MCV 98.8 08/23/2007 2115   MCHC 33.6 08/23/2007 2115   RDW 14.4 08/23/2007 2115   LYMPHSABS 1.4 08/23/2007 2115   MONOABS 0.4 08/23/2007 2115   EOSABS 0.0 08/23/2007 2115   BASOSABS 0.0 08/23/2007 2115

## 2012-10-24 ENCOUNTER — Ambulatory Visit: Payer: BC Managed Care – PPO | Admitting: *Deleted

## 2012-10-29 ENCOUNTER — Ambulatory Visit: Payer: BC Managed Care – PPO | Admitting: Internal Medicine

## 2012-11-29 ENCOUNTER — Ambulatory Visit: Payer: BC Managed Care – PPO | Admitting: Internal Medicine

## 2013-01-10 ENCOUNTER — Other Ambulatory Visit: Payer: Self-pay

## 2013-01-21 ENCOUNTER — Other Ambulatory Visit: Payer: Self-pay | Admitting: Cardiology

## 2013-01-22 ENCOUNTER — Ambulatory Visit (INDEPENDENT_AMBULATORY_CARE_PROVIDER_SITE_OTHER): Payer: BC Managed Care – PPO | Admitting: Internal Medicine

## 2013-01-22 ENCOUNTER — Encounter: Payer: Self-pay | Admitting: Internal Medicine

## 2013-01-22 VITALS — BP 118/64 | HR 62 | Temp 97.8°F | Resp 10 | Wt 214.5 lb

## 2013-01-22 DIAGNOSIS — E118 Type 2 diabetes mellitus with unspecified complications: Secondary | ICD-10-CM

## 2013-01-22 NOTE — Patient Instructions (Addendum)
Try [email or web address] Keep Lantus at 47 units at night. Increase mealtime NovoLog to 12-19-13 units with B-L-D, respectively. NovoLog SSI: - 150-175: + 2 units - 176-200: + 3 units - 201-225: + 4 units - 226-250: + 5 units - 251-275: + 6 units - 276-300: + 7 units - >301: + 8 units Please return in 1 month with your sugar log.

## 2013-01-22 NOTE — Progress Notes (Signed)
Patient ID: [name], female   DOB: 1946/01/15, 67 y.o.   MRN: [id number]  HPI: [name] is a 67 y.o.-year-old female, returning for f/u for DM2, dx 1984 (GDM in 1979) insulin-dependent, uncontrolled, with complications (Peripheral neuropathy, CKD 4, CAD, s/p stent 2008). Last visit 4 mo ago.  Last hemoglobin A1c was: ~10% 6 mo ago. However, [name] had labs and believes a new HbA1c by PCP 1 mo ago. 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  Pt is on a regimen of: - Lantus 35 units qhs >> 47 units (increased by PCP) - Novolog mealtime 10 units with lunch and dinner. No carbs with b'fast. - NovoLog SSI: - 150-175: + 2 units - 176-200: + 3 units - 201-225: + 4 units - 226-250: + 5 units - 251-275: + 6 units - 276-300: + 7 units - >301: + 8 units [name] had lows at night, approx. 2x a week, wake [name] up if <50, but not anymore.  Pt checks [name] sugars 4x a day (does not bring a log) and they are fluctuating - in [name] 300s when not taking [name] mealtime insulin - am: fluctuating: 150-260, can go up to 300s. Once 97, once 49, once 57 >> 138-250s >> 115-400, average 200s - before lunch: 120-250 >> 93-163 >> 160-250 - before dinner: 180-300 >> 150-200 >> 160-200 - bedtime: 200s >> 150-200's >> 175-200 No lows; [name] has hypoglycemia awareness at 50 - since this is happening at night. Highest sugar was HI - last week, in am; this week 400.  Pt's meals are: - Breakfast: boiled egg, [place] bacon, coffee; sometimes oatmeal - Lunch: salad + sandwich - Dinner: baked chicken, roll, green beans - Snacks: 2  Pt does have chronic kidney disease,BUN/creatinine was BUN: 37 (6-23), Cr 1.63 (0.5-1.1). [name] sees [facility] - Dr. Eliott Nine.   Pt's last eye exam was in 07/09//2014. No DR. She had cataracts removed earlier this year: 01 and 07/2012.  Has occas. numbness and tingling in [name] legs.  I reviewed pt's  medications, allergies, PMH, social hx, family hx and no changes required, except as mentioned above.  ROS: Constitutional: no weight gain/loss, + fatigue, no subjective hyperthermia/hypothermia, + poor sleep, + nocturia Eyes: no blurry vision, no xerophthalmia ENT: no sore throat, no nodules palpated in throat, no dysphagia/odynophagia, no hoarseness Cardiovascular: no CP/SOB/palpitations/+ leg swelling Respiratory: + cough/no SOB Gastrointestinal: no N/V/D/C Musculoskeletal: + muscle aches/no joint aches Skin: no rashes;  Neurological: no tremors/numbness/tingling/dizziness  PE: BP 118/64  Pulse 62  Temp(Src) 97.8 F (36.6 C) (Oral)  Resp 10  Wt 214 lb 8 oz (97.297 kg)  SpO2 97% Wt Readings from Last 3 Encounters:  01/22/13 214 lb 8 oz (97.297 kg)  10/19/12 216 lb (97.977 kg)  09/14/12 214 lb (97.07 kg)   Constitutional: overweight - protuberant abdomen, in NAD Eyes: PERRLA, EOMI, no exophthalmos ENT: moist mucous membranes, no thyromegaly, no cervical lymphadenopathy Cardiovascular: RRR, No MRG Respiratory: CTA B Gastrointestinal: abdomen soft, NT, ND, BS+ Musculoskeletal: no deformities, strength intact in all 4 Skin: moist, warm, no rashes Neurological: no tremor with outstretched hands, DTR normal in all 4 Foot exam performed today   ASSESSMENT: 1. DM2, insulin-dependent, uncontrolled, with complications - Peripheral neuropathy - CKD 4 - Dr Eliott Nine - CAD, s/p stent 2008, Dr. Valera Castle. Last 2-D echo in 01/2012, EF 65-70%.  PLAN:  1.  Pt with  long standing diabetes, with poor, but improved control, on a basal-bolus regimen. [name] missed more doses lately due to stress of [name]'s illness. [name] does not bring a log, so it is difficult to evaluate [name] sugars in much detail.  - [name] sugars are higher in am after stopping [name] Metformin  - I advised [name] to: Patient Instructions  Try [email or web address] Keep Lantus at 47 units at night. Increase mealtime NovoLog to  12-19-13 units with B-L-D, respectively. NovoLog SSI: - 150-175: + 2 units - 176-200: + 3 units - 201-225: + 4 units - 226-250: + 5 units - 251-275: + 6 units - 276-300: + 7 units - >301: + 8 units Please return in 1 month with your sugar log.  - Advised to check sugars 3x a day - referred to nutrition at last visit, awaiting call - will try to get records from PCP Re: HbA1c  Addendum; - we called Dr Adaline Sill office - pt had labs but not a HbA1c >> will check one next time

## 2013-01-29 ENCOUNTER — Ambulatory Visit (HOSPITAL_COMMUNITY)
Admission: RE | Admit: 2013-01-29 | Discharge: 2013-01-29 | Disposition: A | Discharge status: Home / Self Care | Payer: BC Managed Care – PPO | Source: Ambulatory Visit | Attending: Family Medicine | Admitting: Family Medicine

## 2013-01-29 ENCOUNTER — Other Ambulatory Visit (HOSPITAL_COMMUNITY): Payer: Self-pay | Admitting: Family Medicine

## 2013-01-29 DIAGNOSIS — R0981 Nasal congestion: Secondary | ICD-10-CM

## 2013-01-29 DIAGNOSIS — J3489 Other specified disorders of nose and nasal sinuses: Secondary | ICD-10-CM | POA: Insufficient documentation

## 2013-02-21 ENCOUNTER — Ambulatory Visit: Payer: BC Managed Care – PPO | Admitting: Internal Medicine

## 2013-02-21 DIAGNOSIS — Z0289 Encounter for other administrative examinations: Secondary | ICD-10-CM

## 2013-03-26 DIAGNOSIS — H26499 Other secondary cataract, unspecified eye: Secondary | ICD-10-CM | POA: Diagnosis not present

## 2013-03-27 DIAGNOSIS — L97509 Non-pressure chronic ulcer of other part of unspecified foot with unspecified severity: Secondary | ICD-10-CM | POA: Diagnosis not present

## 2013-03-27 DIAGNOSIS — E1149 Type 2 diabetes mellitus with other diabetic neurological complication: Secondary | ICD-10-CM | POA: Diagnosis not present

## 2013-04-01 DIAGNOSIS — Z1231 Encounter for screening mammogram for malignant neoplasm of breast: Secondary | ICD-10-CM | POA: Diagnosis not present

## 2013-04-24 DIAGNOSIS — J069 Acute upper respiratory infection, unspecified: Secondary | ICD-10-CM | POA: Diagnosis not present

## 2013-04-24 DIAGNOSIS — J209 Acute bronchitis, unspecified: Secondary | ICD-10-CM | POA: Diagnosis not present

## 2013-05-04 DIAGNOSIS — I10 Essential (primary) hypertension: Secondary | ICD-10-CM | POA: Diagnosis not present

## 2013-05-04 DIAGNOSIS — J209 Acute bronchitis, unspecified: Secondary | ICD-10-CM | POA: Diagnosis not present

## 2013-05-05 ENCOUNTER — Other Ambulatory Visit: Payer: Self-pay | Admitting: Cardiology

## 2013-05-07 ENCOUNTER — Telehealth: Payer: Self-pay | Admitting: *Deleted

## 2013-05-07 MED ORDER — CLOPIDOGREL BISULFATE 75 MG PO TABS: ORAL_TABLET | ORAL | Status: DC

## 2013-05-07 NOTE — Telephone Encounter (Signed)
Pt needs plavix rx called in to [facility] in [place]

## 2013-05-08 DIAGNOSIS — J069 Acute upper respiratory infection, unspecified: Secondary | ICD-10-CM | POA: Diagnosis not present

## 2013-05-30 DIAGNOSIS — I10 Essential (primary) hypertension: Secondary | ICD-10-CM | POA: Diagnosis not present

## 2013-05-30 DIAGNOSIS — E119 Type 2 diabetes mellitus without complications: Secondary | ICD-10-CM | POA: Diagnosis not present

## 2013-06-14 DIAGNOSIS — E1129 Type 2 diabetes mellitus with other diabetic kidney complication: Secondary | ICD-10-CM | POA: Diagnosis not present

## 2013-06-19 DIAGNOSIS — B351 Tinea unguium: Secondary | ICD-10-CM | POA: Diagnosis not present

## 2013-06-19 DIAGNOSIS — E1149 Type 2 diabetes mellitus with other diabetic neurological complication: Secondary | ICD-10-CM | POA: Diagnosis not present

## 2013-06-21 DIAGNOSIS — E1165 Type 2 diabetes mellitus with hyperglycemia: Secondary | ICD-10-CM | POA: Diagnosis not present

## 2013-06-21 DIAGNOSIS — I10 Essential (primary) hypertension: Secondary | ICD-10-CM | POA: Diagnosis not present

## 2013-06-21 DIAGNOSIS — E785 Hyperlipidemia, unspecified: Secondary | ICD-10-CM | POA: Diagnosis not present

## 2013-06-21 DIAGNOSIS — E1129 Type 2 diabetes mellitus with other diabetic kidney complication: Secondary | ICD-10-CM | POA: Diagnosis not present

## 2013-06-21 DIAGNOSIS — E669 Obesity, unspecified: Secondary | ICD-10-CM | POA: Diagnosis not present

## 2013-07-02 ENCOUNTER — Ambulatory Visit: Payer: BC Managed Care – PPO

## 2013-07-03 DIAGNOSIS — J309 Allergic rhinitis, unspecified: Secondary | ICD-10-CM | POA: Diagnosis not present

## 2013-07-03 DIAGNOSIS — IMO0001 Reserved for inherently not codable concepts without codable children: Secondary | ICD-10-CM | POA: Diagnosis not present

## 2013-07-09 ENCOUNTER — Ambulatory Visit: Payer: BC Managed Care – PPO

## 2013-07-16 ENCOUNTER — Ambulatory Visit: Payer: BC Managed Care – PPO

## 2013-07-23 DIAGNOSIS — E785 Hyperlipidemia, unspecified: Secondary | ICD-10-CM | POA: Diagnosis not present

## 2013-07-23 DIAGNOSIS — I10 Essential (primary) hypertension: Secondary | ICD-10-CM | POA: Diagnosis not present

## 2013-07-23 DIAGNOSIS — E1129 Type 2 diabetes mellitus with other diabetic kidney complication: Secondary | ICD-10-CM | POA: Diagnosis not present

## 2013-07-23 DIAGNOSIS — E669 Obesity, unspecified: Secondary | ICD-10-CM | POA: Diagnosis not present

## 2013-07-31 DIAGNOSIS — N3020 Other chronic cystitis without hematuria: Secondary | ICD-10-CM | POA: Diagnosis not present

## 2013-07-31 DIAGNOSIS — N3946 Mixed incontinence: Secondary | ICD-10-CM | POA: Diagnosis not present

## 2013-08-16 DIAGNOSIS — M6530 Trigger finger, unspecified finger: Secondary | ICD-10-CM | POA: Diagnosis not present

## 2013-08-16 DIAGNOSIS — M25819 Other specified joint disorders, unspecified shoulder: Secondary | ICD-10-CM | POA: Diagnosis not present

## 2013-08-16 DIAGNOSIS — M7710 Lateral epicondylitis, unspecified elbow: Secondary | ICD-10-CM | POA: Diagnosis not present

## 2013-08-19 ENCOUNTER — Telehealth: Payer: Self-pay | Admitting: General Practice

## 2013-08-19 NOTE — Telephone Encounter (Signed)
Patient called in to schedule her screening colonoscopy, however she is on a blood thinner and will need an ov.

## 2013-08-23 DIAGNOSIS — R350 Frequency of micturition: Secondary | ICD-10-CM | POA: Diagnosis not present

## 2013-08-23 DIAGNOSIS — N3946 Mixed incontinence: Secondary | ICD-10-CM | POA: Diagnosis not present

## 2013-08-28 DIAGNOSIS — E1149 Type 2 diabetes mellitus with other diabetic neurological complication: Secondary | ICD-10-CM | POA: Diagnosis not present

## 2013-08-28 DIAGNOSIS — B351 Tinea unguium: Secondary | ICD-10-CM | POA: Diagnosis not present

## 2013-08-30 DIAGNOSIS — N3020 Other chronic cystitis without hematuria: Secondary | ICD-10-CM | POA: Diagnosis not present

## 2013-08-30 DIAGNOSIS — R350 Frequency of micturition: Secondary | ICD-10-CM | POA: Diagnosis not present

## 2013-08-30 DIAGNOSIS — N3946 Mixed incontinence: Secondary | ICD-10-CM | POA: Diagnosis not present

## 2013-09-11 DIAGNOSIS — M6530 Trigger finger, unspecified finger: Secondary | ICD-10-CM | POA: Diagnosis not present

## 2013-09-19 ENCOUNTER — Encounter: Payer: Self-pay | Admitting: Cardiovascular Disease

## 2013-09-19 ENCOUNTER — Encounter: Payer: Self-pay | Admitting: *Deleted

## 2013-09-19 ENCOUNTER — Ambulatory Visit (HOSPITAL_COMMUNITY)
Admission: RE | Admit: 2013-09-19 | Discharge: 2013-09-19 | Disposition: A | Discharge status: Home / Self Care | Payer: BC Managed Care – PPO | Source: Ambulatory Visit | Attending: Cardiovascular Disease | Admitting: Cardiovascular Disease

## 2013-09-19 ENCOUNTER — Ambulatory Visit (INDEPENDENT_AMBULATORY_CARE_PROVIDER_SITE_OTHER): Payer: BC Managed Care – PPO | Admitting: Cardiovascular Disease

## 2013-09-19 VITALS — BP 174/74 | HR 61 | Ht 70.0 in | Wt 216.0 lb

## 2013-09-19 DIAGNOSIS — R5383 Other fatigue: Secondary | ICD-10-CM

## 2013-09-19 DIAGNOSIS — E663 Overweight: Secondary | ICD-10-CM

## 2013-09-19 DIAGNOSIS — I200 Unstable angina: Secondary | ICD-10-CM

## 2013-09-19 DIAGNOSIS — E785 Hyperlipidemia, unspecified: Secondary | ICD-10-CM

## 2013-09-19 DIAGNOSIS — I2510 Atherosclerotic heart disease of native coronary artery without angina pectoris: Secondary | ICD-10-CM | POA: Diagnosis not present

## 2013-09-19 DIAGNOSIS — R5381 Other malaise: Secondary | ICD-10-CM | POA: Diagnosis not present

## 2013-09-19 DIAGNOSIS — Z01818 Encounter for other preprocedural examination: Secondary | ICD-10-CM | POA: Insufficient documentation

## 2013-09-19 DIAGNOSIS — I25110 Atherosclerotic heart disease of native coronary artery with unstable angina pectoris: Secondary | ICD-10-CM

## 2013-09-19 DIAGNOSIS — E118 Type 2 diabetes mellitus with unspecified complications: Secondary | ICD-10-CM

## 2013-09-19 DIAGNOSIS — I10 Essential (primary) hypertension: Secondary | ICD-10-CM | POA: Diagnosis not present

## 2013-09-19 DIAGNOSIS — R9431 Abnormal electrocardiogram [ECG] [EKG]: Secondary | ICD-10-CM

## 2013-09-19 NOTE — Progress Notes (Signed)
Patient ID: [name], female   DOB: September 08, 1945, 68 y.o.   MRN: [id number]       SUBJECTIVE: The patient is a 68 year old woman who I'm evaluating for the first time, who previously saw Dr. Verl Blalock. She has a history of coronary artery disease and underwent percutaneous coronary intervention of the mid circumflex with 2 overlapping Taxus drug-eluting stents by Dr. Eustace Quail in 2008. She had diffuse disease in the left anterior descending with 40% proximal, 70% mid and 80% distal stenosis and 80% stenosis in the first diagonal branch by cath in 03/2006. Left ventriculography at the time demonstrated normal left ventricular systolic function. Echo in 01/2012 demonstrated vigorous left ventricular systolic function, EF 10-93%. She also has hypertension, hyperlipidemia, and insulin-dependent diabetes mellitus.  She denies chest pain and shortness of breath, but says she never had chest pain or shortness of breath even prior to her stents. She said she has felt fatigued over the past 6 months and has attributed this to her medications. She has dizziness intermittently, but also has vertigo.  ECG performed in the office today demonstrates T-wave inversions in leads I, inferior leads, and V4 through V6. The T wave inversions in leads III and aVF are new when compared to an ECG from 01/2012.  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    Allergies  Allergen Reactions  . Ace Inhibitors   . Codeine Rash    Current Outpatient Prescriptions  Medication Sig Dispense Refill  . ALPRAZolam (XANAX) 0.5 MG tablet Take 0.5 mg by mouth at bedtime as needed.        Marland Kitchen aspirin 325 MG tablet Take 325 mg by mouth daily.        . benzonatate (TESSALON) 200 MG capsule AS NEEDED      . clopidogrel (PLAVIX) 75 MG tablet TAKE ONE TABLET BY MOUTH ONCE DAILY  30 tablet  4  . famotidine (PEPCID) 20 MG tablet 1 TAB DAILY      . HYDROcodone-homatropine (HYCODAN) 5-1.5 MG/5ML syrup       . insulin  aspart (NOVOLOG) 100 UNIT/ML injection Inject  Under skin 10 units 2x a day + SSI = max 30 units a day  3 vial  PRN  . insulin detemir (LEVEMIR) 100 UNIT/ML injection Inject 60 Units into the skin at bedtime.      Marland Kitchen latanoprost (XALATAN) 0.005 % ophthalmic solution BOTH EYES 1 DROP AT BEDTIME      . meclizine (ANTIVERT) 12.5 MG tablet AS NEEDED FOR DIZZINESS      . metoprolol (LOPRESSOR) 50 MG tablet TAKE ONE TABLET BY MOUTH TWICE DAILY  180 tablet  2  . Olmesartan-Amlodipine-HCTZ (TRIBENZOR) 40-10-25 MG TABS Take 1 tablet by mouth daily.  30 tablet    . Omega-3 Fatty Acids (FISH OIL TRIPLE STRENGTH PO) Take 1,240 mg by mouth.      . rosuvastatin (CRESTOR) 20 MG tablet Take 20 mg by mouth daily.        Marland Kitchen torsemide (DEMADEX) 20 MG tablet Take 20 mg by mouth 2 (two) times daily.        Marland Kitchen trimethoprim (TRIMPEX) 100 MG tablet 1 TAB DAILY       No current facility-administered medications for this visit.    Past Medical History  Diagnosis Date  . Hypertension   . Hyperlipidemia   . Diabetes mellitus   . Obesity   . GERD (gastroesophageal reflux disease)   . Heart murmur, systolic   .  Anxiety   . Constipation   . Overactive bladder   . Allergic rhinitis   . Low back pain   . Osteoarthritis   . History of hysterectomy     Past Surgical History  Procedure Laterality Date  . Carpal tunnel release    . Trigger finger release    . Cervical biopsy      cervical lymph node biopsies  . Back surgery      multiple  . Coronary stent placement  04/11/2006    2 -- Taxus stents to the circumflex   . Cardiac catheterization  04/04/2006    Est EF of 60%    History   Social History  . Marital Status: Married    Spouse Name: N/A    Number of Children: N/A  . Years of Education: N/A   Occupational History  . Not on file.   Social History Main Topics  . Smoking status: Never Smoker   . Smokeless tobacco: Not on file  . Alcohol Use: No  . Drug Use: No  . Sexual Activity: Not on file    Other Topics Concern  . Not on file   Social History Narrative  . No narrative on file     Filed Vitals:   09/19/13 1012  BP: 174/74  Pulse: 61  Height: 5\' 10"  (1.778 m)  Weight: 216 lb (97.977 kg)    PHYSICAL EXAM General: NAD Neck: No JVD, no thyromegaly. Lungs: Clear to auscultation bilaterally with normal respiratory effort. CV: Nondisplaced PMI.  Regular rate and rhythm, normal S1/S2, no S3/S4, no murmur. No pretibial or periankle edema.  No carotid bruit.  Normal pedal pulses.  Abdomen: Soft, nontender, no hepatosplenomegaly, no distention.  Neurologic: Alert and oriented x 3.  Psych: Normal affect. Extremities: No clubbing or cyanosis.   ECG: reviewed and available in electronic records.   03/2006 Left main: Free of disease.  Left anterior descending artery: Left anterior descending artery was  diffusely irregular and diffusely diseased. There was 40% proximal,  tandem 70% mid, and 80% distal stenoses in the LAD. First diagonal  branch which was a moderately large vessel had an 80% of proximal  stenosis.  Circumflex artery: The circumflex artery gave rise to a ramus branch, a  small marginal branch and two posterolateral branches. There was a long  area of segmental disease with focal areas of 90 and 80% stenosis in the  proximal to mid circumflex artery.  Right coronary artery: The right coronary artery is a moderate-sized  vessel that gave rise to conus branch, a right ventricular branch,  posterior descending branch and a small posterolateral branch. There is  40% narrowing in the mid-right coronary artery.     ASSESSMENT AND PLAN: 1. CAD with abnormal ECG and fatigue: Her clinical picture is a bit problematic as she denies chest pain and shortness of breath. It appears that fatigue is likely her anginal equivalent. She had severe LAD disease in 2008 and had a 40% mid RCA lesion at that time as well. I had a long discussion with the patient, and have  decided to proceed with coronary angiography. I will reduce aspirin to 81 mg daily. I will continue Plavix along with metoprolol and Crestor. 2. HTN: She appears to have white coat hypertension. She checks her blood pressure routinely at home and it normally runs in the 130/70 mmHg range. She was anxious coming to the office today and took 0.5 mg Xanax. 3. Hyperlipidemia: On Crestor 20 mg  daily. Will soon check LFT's and lipids. 4. IDDM: On Novolog and Levemir.  Dispo: f/u after cath.  Kate Sable, M.D., F.A.C.C.

## 2013-09-19 NOTE — Addendum Note (Signed)
Addended by: Truett Mainland on: 09/19/2013 04:38 PM   Modules accepted: Orders

## 2013-09-19 NOTE — Patient Instructions (Signed)
Your physician recommends that you schedule a follow-up appointment in:  [name] has requested that you have a cardiac catheterization. Cardiac catheterization is used to diagnose and/or treat various heart conditions. Doctors may recommend this procedure for a number of different reasons. The most common reason is to evaluate chest pain. Chest pain can be a symptom of coronary artery disease (CAD), and cardiac catheterization can show whether plaque is narrowing or blocking your heart's arteries. This procedure is also used to evaluate the valves, as well as measure the blood flow and oxygen levels in different parts of your heart. For further information please visit [facility]uge[facility]iest[email or web address]. Please follow instruction sheet, as given.  Your physician recommends that you return for lab work Monday. CBC, BMET  A chest x-ray takes a picture of the organs and structures inside the chest, including the heart, lungs, and blood vessels. This test can show several things, including, whether the heart is enlarges; whether fluid is building up in the lungs; and whether pacemaker / defibrillator leads are still in place.  Please start Aspirin 81 mg daily.

## 2013-09-20 ENCOUNTER — Telehealth: Payer: Self-pay | Admitting: *Deleted

## 2013-09-20 NOTE — Telephone Encounter (Signed)
Message copied by Desma Mcgregor on Fri Sep 20, 2013  1:11 PM ------      Message from: Kate Sable A      Created: Fri Sep 20, 2013 12:54 PM       No active disease. ------

## 2013-09-20 NOTE — Telephone Encounter (Signed)
Notified pt husband of normal CX results. Understood and will relay to pt

## 2013-09-23 ENCOUNTER — Ambulatory Visit (INDEPENDENT_AMBULATORY_CARE_PROVIDER_SITE_OTHER): Payer: BC Managed Care – PPO | Admitting: Gastroenterology

## 2013-09-23 ENCOUNTER — Encounter: Payer: Self-pay | Admitting: Gastroenterology

## 2013-09-23 ENCOUNTER — Telehealth: Payer: Self-pay | Admitting: Gastroenterology

## 2013-09-23 VITALS — BP 141/63 | HR 66 | Temp 97.1°F | Resp 18 | Ht 70.0 in | Wt 213.0 lb

## 2013-09-23 DIAGNOSIS — K219 Gastro-esophageal reflux disease without esophagitis: Secondary | ICD-10-CM | POA: Diagnosis not present

## 2013-09-23 DIAGNOSIS — I2510 Atherosclerotic heart disease of native coronary artery without angina pectoris: Secondary | ICD-10-CM

## 2013-09-23 DIAGNOSIS — I200 Unstable angina: Secondary | ICD-10-CM

## 2013-09-23 LAB — CBC WITH DIFFERENTIAL/PLATELET
Basophils Absolute: 0.1 10*3/uL (ref 0.0–0.1)
Basophils Relative: 1 % (ref 0–1)
Eosinophils Absolute: 0.1 10*3/uL (ref 0.0–0.7)
Eosinophils Relative: 1 % (ref 0–5)
HEMATOCRIT: 36.9 % (ref 36.0–46.0)
Hemoglobin: 12.2 g/dL (ref 12.0–15.0)
LYMPHS PCT: 25 % (ref 12–46)
Lymphs Abs: 1.3 10*3/uL (ref 0.7–4.0)
MCH: 32.8 pg (ref 26.0–34.0)
MCHC: 33.1 g/dL (ref 30.0–36.0)
MCV: 99.2 fL (ref 78.0–100.0)
MONOS PCT: 9 % (ref 3–12)
Monocytes Absolute: 0.5 10*3/uL (ref 0.1–1.0)
NEUTROS ABS: 3.3 10*3/uL (ref 1.7–7.7)
Neutrophils Relative %: 64 % (ref 43–77)
Platelets: 226 10*3/uL (ref 150–400)
RBC: 3.72 MIL/uL — ABNORMAL LOW (ref 3.87–5.11)
RDW: 14.5 % (ref 11.5–15.5)
WBC: 5.1 10*3/uL (ref 4.0–10.5)

## 2013-09-23 LAB — BASIC METABOLIC PANEL
BUN: 58 mg/dL — ABNORMAL HIGH (ref 6–23)
CALCIUM: 10.7 mg/dL — AB (ref 8.4–10.5)
CO2: 33 meq/L — AB (ref 19–32)
Chloride: 93 mEq/L — ABNORMAL LOW (ref 96–112)
Creat: 2.23 mg/dL — ABNORMAL HIGH (ref 0.50–1.10)
Glucose, Bld: 258 mg/dL — ABNORMAL HIGH (ref 70–99)
Potassium: 4.9 mEq/L (ref 3.5–5.3)
SODIUM: 136 meq/L (ref 135–145)

## 2013-09-23 MED ORDER — PANTOPRAZOLE SODIUM 40 MG PO TBEC: 40.0000 mg | DELAYED_RELEASE_TABLET | Freq: Every day | ORAL | Status: DC

## 2013-09-23 NOTE — Telephone Encounter (Signed)
Printed and placed on [employer].

## 2013-09-23 NOTE — Telephone Encounter (Signed)
Pt is being seen today by LSL. I called [facility] to get last TCS and Path report. Wannetta Sender called me back to say that Dr Tamala Julian did colonoscopy with no path report on Jul 19, 2000 and to repeat in 5 yrs. Trish stated that the printer was no connecting to the program and was unable to print report and is working on getting that corrected. She did say that the report said normal with no abnormalities.

## 2013-09-23 NOTE — Progress Notes (Signed)
Reviewed records for last colonoscopy 2008. See updated past surgical history.

## 2013-09-23 NOTE — Assessment & Plan Note (Signed)
68 year old lady who presents primarily to schedule 10 year followup colonoscopy. We have requested records but she may not be due until 2018. Overall doing well from a GI standpoint but does have some vague upper GI symptoms consisting of malodorous belching. Potentially symptom of gastroparesis or GERD. She has an upcoming cardiac catheterization on Friday. In the interim we will switch her famotidine to pantoprazole 40 mg daily. We will followup pending lab work. Call her next week to see how she is doing with regards to pantoprazole. Would consider possibility of upper endoscopy plus or minus gastric emptying study if symptoms are ongoing. Patient voiced understanding and agrees with plan. 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

## 2013-09-23 NOTE — Patient Instructions (Signed)
1. Stop famotidine. Start pantoprazole 40mg  daily before breakfast. RX sent to your pharmacy. 2. I will review your records when available. We will let you know when your next colonoscopy is due. 3. Call next Thursday and let us know how you are doing with the new medication. We will decide if any further testing will be needed at that time.

## 2013-09-23 NOTE — Progress Notes (Signed)
LM for pt to call

## 2013-09-23 NOTE — Progress Notes (Signed)
Please let patient know we reviewed her records. She had suboptimal prep in 2008. Per Dr. Oneida Alar, she should have another colonoscopy this year.  Since she is scheduled for heart cath on Friday, we need to wait until those results before we schedule her.

## 2013-09-23 NOTE — Progress Notes (Addendum)
Primary Care Physician:  Maggie Font, MD  Primary Gastroenterologist:  Barney Drain, MD   Chief Complaint  Patient presents with  . Referral    HPI:  [name] is a 68 y.o. female here as self-referral for further consideration of colonoscopy. She thought she was due for 10 year follow-up. Last one done around 2008 with Dr. Laural Golden. We have requested records. She wants to establish care with Dr. Oneida Alar because she takes care of her husband.   No problems with BMs. No melena, brbpr. No heartburn. Complains of malodorous belches. DM for over 27 years. No esophageal dysphagia. No N/V. No early satiety. No abdominal pain. No unintentional weight loss.   Remote EGD, Dr. Irving Shows, few ulcers per patient. Patient denies any history of colon polyps. Normal colonoscopy by Dr. Tamala Julian 2002.   Current Outpatient Prescriptions  Medication Sig Dispense Refill  . ALPRAZolam (XANAX) 0.5 MG tablet Take 0.5 mg by mouth at bedtime as needed.        Marland Kitchen aspirin EC 81 MG tablet Take 81 mg by mouth daily.      . benzonatate (TESSALON) 200 MG capsule AS NEEDED      . clopidogrel (PLAVIX) 75 MG tablet TAKE ONE TABLET BY MOUTH ONCE DAILY  30 tablet  4  . HYDROcodone-homatropine (HYCODAN) 5-1.5 MG/5ML syrup       . insulin aspart (NOVOLOG) 100 UNIT/ML injection Inject  Under skin 10 units 2x a day + SSI = max 30 units a day  3 vial  PRN  . insulin detemir (LEVEMIR) 100 UNIT/ML injection Inject 60 Units into the skin at bedtime.      Marland Kitchen latanoprost (XALATAN) 0.005 % ophthalmic solution BOTH EYES 1 DROP AT BEDTIME      . meclizine (ANTIVERT) 12.5 MG tablet AS NEEDED FOR DIZZINESS      . metoprolol (LOPRESSOR) 50 MG tablet TAKE ONE TABLET BY MOUTH TWICE DAILY  180 tablet  2  . Olmesartan-Amlodipine-HCTZ (TRIBENZOR) 40-10-25 MG TABS Take 1 tablet by mouth daily.  30 tablet    . Omega-3 Fatty Acids (FISH OIL TRIPLE STRENGTH PO) Take 1,240 mg by mouth.      . rosuvastatin (CRESTOR) 20 MG tablet Take 20 mg by  mouth daily.        Marland Kitchen torsemide (DEMADEX) 20 MG tablet Take 20 mg by mouth 2 (two) times daily.        Marland Kitchen trimethoprim (TRIMPEX) 100 MG tablet 1 TAB DAILY      . pantoprazole (PROTONIX) 40 MG tablet Take 1 tablet (40 mg total) by mouth daily.  90 tablet  3   No current facility-administered medications for this visit.    Allergies as of 09/23/2013 - Review Complete 09/23/2013  Allergen Reaction Noted  . Ace inhibitors  04/11/2006  . Codeine Rash 01/20/2011    Past Medical History  Diagnosis Date  . Hypertension   . Hyperlipidemia   . Diabetes mellitus   . Obesity   . GERD (gastroesophageal reflux disease)   . Heart murmur, systolic   . Anxiety   . Constipation   . Overactive bladder   . Allergic rhinitis   . Low back pain   . Osteoarthritis   . History of hysterectomy     Past Surgical History  Procedure Laterality Date  . Carpal tunnel release    . Trigger finger release    . Cervical biopsy      cervical lymph node biopsies  . Back surgery  multiple  . Coronary stent placement  04/11/2006    2 -- Taxus stents to the circumflex   . Cardiac catheterization  04/04/2006    Est EF of 60%  . Abdominal hysterectomy    . Tendonitis      bilateral elbow  . Colonoscopy  May 2002    Dr. Irving Shows :Followup in 5 years, normal exam  . Colonoscopy  2008    Dr. Laural Golden: Very redundant colon with mild melanosis coli, splenic flexure polyp biopsy with acute complaint of benign colon polyp. Recommended ten-year followup    Family History  Problem Relation Age of Onset  . Diabetes Father   . Hypertension Father   . Stroke Father   . Hypertension Brother   . Aneurysm Brother   . Diabetes Brother   . Colon cancer Neg Hx     History   Social History  . Marital Status: Married    Spouse Name: N/A    Number of Children: 3  . Years of Education: N/A   Occupational History  . Not on file.   Social History Main Topics  . Smoking status: Never Smoker   .  Smokeless tobacco: Not on file  . Alcohol Use: No  . Drug Use: No  . Sexual Activity: Not on file   Other Topics Concern  . Not on file   Social History Narrative  . No narrative on file      ROS:  General: Negative for anorexia, weight loss, fever, chills, weakness. +fatigue Eyes: Negative for vision changes.  ENT: Negative for hoarseness, difficulty swallowing , nasal congestion. CV: Negative for chest pain, angina, palpitations, dyspnea on exertion, peripheral edema.  Respiratory: Negative for dyspnea at rest, dyspnea on exertion, cough, sputum, wheezing.  GI: See history of present illness. GU:  Negative for dysuria, hematuria, urinary incontinence, urinary frequency, nocturnal urination.  MS: Negative for joint pain, low back pain.  Derm: Negative for rash or itching.  Neuro: Negative for weakness, abnormal sensation, seizure, frequent headaches, memory loss, confusion.  Psych: Negative for anxiety, depression, suicidal ideation, hallucinations.  Endo: Negative for unusual weight change.  Heme: Negative for bruising or bleeding. Allergy: Negative for rash or hives.    Physical Examination:  BP 141/63  Pulse 66  Temp(Src) 97.1 F (36.2 C) (Oral)  Resp 18  Ht 5\' 10"  (1.778 m)  Wt 213 lb (96.616 kg)  BMI 30.56 kg/m2   General: Well-nourished, well-developed in no acute distress.  Head: Normocephalic, atraumatic.   Eyes: Conjunctiva pink, no icterus. Mouth: Oropharyngeal mucosa moist and pink , no lesions erythema or exudate. Neck: Supple without thyromegaly, masses, or lymphadenopathy.  Lungs: Clear to auscultation bilaterally.  Heart: Regular rate and rhythm, no murmurs rubs or gallops.  Abdomen: Bowel sounds are normal, nontender, nondistended, no hepatosplenomegaly or masses, no abdominal bruits or    hernia , no rebound or guarding.   Rectal: not performed Extremities: No lower extremity edema. No clubbing or deformities.  Neuro: Alert and oriented x 4 ,  grossly normal neurologically.  Skin: Warm and dry, no rash or jaundice.   Psych: Alert and cooperative, normal mood and affect.    Imaging Studies: Dg Chest 2 View  09/19/2013   CLINICAL DATA:  Pre cardiac catheterization  EXAM: CHEST  2 VIEW  COMPARISON:  Chest x-ray of 05/09/2004  FINDINGS: No active infiltrate or effusion is seen. Mediastinal and hilar contours are unremarkable. The heart is mildly enlarged. There are degenerative changes throughout the  mid to lower thoracic spine.  IMPRESSION: Mild cardiomegaly.  No active lung disease.   Electronically Signed   By: Ivar Drape M.D.   On: 09/19/2013 14:06   Labs from March 2015 Glucose 339, BUN 59, creatinine 1.77, total bilirubin 0.4, alkaline phosphatase 80, AST 26, ALT 22, hemoglobin A1c 12.8.

## 2013-09-24 NOTE — Progress Notes (Signed)
Called and informed pt.  

## 2013-09-26 ENCOUNTER — Telehealth: Payer: Self-pay

## 2013-09-26 ENCOUNTER — Other Ambulatory Visit: Payer: Self-pay

## 2013-09-26 DIAGNOSIS — I10 Essential (primary) hypertension: Secondary | ICD-10-CM

## 2013-09-26 NOTE — Telephone Encounter (Signed)
Message copied by Bernita Raisin on Thu Sep 26, 2013  2:30 PM ------      Message from: Lestine Mount      Created: Thu Sep 26, 2013  2:02 PM      Regarding: FW: need cath orders put in       [facility] they just called to cancel the cath. Pt creatine was 2.2, we need to get pt to have a BMET again next week (they are not adjust and medications at this time) to see if it changes and go from there as far a cath is concerned.  Please notify patient. They are canceling the cath on there part so we don't need to worry about that part.                  ----- Message -----         From: Nevada Crane         Sent: 09/26/2013   1:56 PM           To: Bernita Raisin, RN      Subject: need cath orders put in                                  Pt is scheduled for cath tomorrow and they are calling for orders to be put in       ------

## 2013-09-26 NOTE — Progress Notes (Signed)
Notified Dr Angelena Form of pt's Creatine Level of 2.23. He has reviewed the patient chart and says that cath needs to be cancelled and BMP check next week in office.  Notified Tonya in the [place] office to cancel cath and notify the patient.

## 2013-09-26 NOTE — Telephone Encounter (Signed)
Cardiac cath cx due to elevated creatinine,pt made aware,will repeat BMET next week,order placed

## 2013-09-27 ENCOUNTER — Ambulatory Visit (HOSPITAL_COMMUNITY)
Admission: RE | Admit: 2013-09-27 | Payer: BC Managed Care – PPO | Source: Ambulatory Visit | Admitting: Cardiovascular Disease

## 2013-09-27 ENCOUNTER — Encounter (HOSPITAL_COMMUNITY): Admission: RE | Payer: Self-pay | Source: Ambulatory Visit

## 2013-09-27 SURGERY — LEFT HEART CATHETERIZATION WITH CORONARY ANGIOGRAM
Anesthesia: LOCAL

## 2013-09-30 ENCOUNTER — Telehealth: Payer: Self-pay | Admitting: *Deleted

## 2013-09-30 DIAGNOSIS — H0289 Other specified disorders of eyelid: Secondary | ICD-10-CM | POA: Diagnosis not present

## 2013-09-30 DIAGNOSIS — H04129 Dry eye syndrome of unspecified lacrimal gland: Secondary | ICD-10-CM | POA: Diagnosis not present

## 2013-09-30 MED ORDER — TORSEMIDE 20 MG PO TABS: 20.0000 mg | ORAL_TABLET | Freq: Every day | ORAL | Status: DC

## 2013-09-30 NOTE — Telephone Encounter (Signed)
Per note on labs from Dr Bronson Ing pt is to take Torsemide 20 mg daily and repeat BMET in 1 week. Pt understood.

## 2013-10-01 NOTE — Progress Notes (Signed)
cc'd to pcp 

## 2013-10-07 DIAGNOSIS — M6530 Trigger finger, unspecified finger: Secondary | ICD-10-CM | POA: Diagnosis not present

## 2013-10-08 ENCOUNTER — Other Ambulatory Visit: Payer: Self-pay | Admitting: Cardiovascular Disease

## 2013-10-08 LAB — BASIC METABOLIC PANEL
BUN: 35 mg/dL — ABNORMAL HIGH (ref 6–23)
CHLORIDE: 106 meq/L (ref 96–112)
CO2: 31 meq/L (ref 19–32)
Calcium: 9.4 mg/dL (ref 8.4–10.5)
Creat: 1.73 mg/dL — ABNORMAL HIGH (ref 0.50–1.10)
Glucose, Bld: 52 mg/dL — ABNORMAL LOW (ref 70–99)
POTASSIUM: 4.5 meq/L (ref 3.5–5.3)
SODIUM: 142 meq/L (ref 135–145)

## 2013-10-09 ENCOUNTER — Telehealth: Payer: Self-pay | Admitting: *Deleted

## 2013-10-09 NOTE — Telephone Encounter (Signed)
Notified pt husband of results

## 2013-10-09 NOTE — Telephone Encounter (Signed)
Message copied by Desma Mcgregor on Wed Oct 09, 2013  4:10 PM ------      Message from: Laurine Blazer      Created: Wed Oct 09, 2013  4:00 PM       [place] patient.        ----- Message -----         From: Herminio Commons, MD         Sent: 10/09/2013   9:51 AM           To: Laurine Blazer, LPN            Improved from two weeks ago.       ------

## 2013-10-14 DIAGNOSIS — J019 Acute sinusitis, unspecified: Secondary | ICD-10-CM | POA: Diagnosis not present

## 2013-10-14 DIAGNOSIS — R05 Cough: Secondary | ICD-10-CM | POA: Diagnosis not present

## 2013-10-14 DIAGNOSIS — R059 Cough, unspecified: Secondary | ICD-10-CM | POA: Diagnosis not present

## 2013-10-15 ENCOUNTER — Other Ambulatory Visit: Payer: Self-pay

## 2013-10-15 MED ORDER — METOPROLOL TARTRATE 50 MG PO TABS: ORAL_TABLET | ORAL | Status: DC

## 2013-10-20 DIAGNOSIS — J020 Streptococcal pharyngitis: Secondary | ICD-10-CM | POA: Diagnosis not present

## 2013-10-20 DIAGNOSIS — M542 Cervicalgia: Secondary | ICD-10-CM | POA: Diagnosis not present

## 2013-10-20 DIAGNOSIS — R21 Rash and other nonspecific skin eruption: Secondary | ICD-10-CM | POA: Diagnosis not present

## 2013-11-24 DIAGNOSIS — M542 Cervicalgia: Secondary | ICD-10-CM | POA: Diagnosis not present

## 2013-11-24 DIAGNOSIS — J9801 Acute bronchospasm: Secondary | ICD-10-CM | POA: Diagnosis not present

## 2013-12-30 ENCOUNTER — Other Ambulatory Visit: Payer: Self-pay | Admitting: Cardiovascular Disease

## 2014-01-01 DIAGNOSIS — M65342 Trigger finger, left ring finger: Secondary | ICD-10-CM | POA: Diagnosis not present

## 2014-01-01 DIAGNOSIS — M65352 Trigger finger, left little finger: Secondary | ICD-10-CM | POA: Diagnosis not present

## 2014-01-08 ENCOUNTER — Telehealth: Payer: Self-pay | Admitting: Gastroenterology

## 2014-01-08 NOTE — Telephone Encounter (Signed)
Please offer patient an OV to consider TCS+/-EGD. She was seen in 09/2013. We were waiting for her to have cardiac cath but this ended up being cancelled. We need to pick up where we left off at last OV.

## 2014-01-08 NOTE — Telephone Encounter (Signed)
LMOM for pt to call and see if we can schedule her an OV.

## 2014-01-15 ENCOUNTER — Encounter: Payer: Self-pay | Admitting: General Practice

## 2014-01-15 NOTE — Telephone Encounter (Signed)
Tried to call the patient no answer, will mail a letter

## 2014-01-15 NOTE — Telephone Encounter (Signed)
Letter mailed

## 2014-01-15 NOTE — Telephone Encounter (Signed)
Per the patient she has not had her heart cath and she will call back after her heart cath to set up her office visit.

## 2014-01-21 DIAGNOSIS — B351 Tinea unguium: Secondary | ICD-10-CM | POA: Diagnosis not present

## 2014-01-21 DIAGNOSIS — M79674 Pain in right toe(s): Secondary | ICD-10-CM | POA: Diagnosis not present

## 2014-01-28 DIAGNOSIS — I10 Essential (primary) hypertension: Secondary | ICD-10-CM | POA: Diagnosis not present

## 2014-01-28 DIAGNOSIS — E6609 Other obesity due to excess calories: Secondary | ICD-10-CM | POA: Diagnosis not present

## 2014-01-28 DIAGNOSIS — E782 Mixed hyperlipidemia: Secondary | ICD-10-CM | POA: Diagnosis not present

## 2014-01-28 DIAGNOSIS — E1122 Type 2 diabetes mellitus with diabetic chronic kidney disease: Secondary | ICD-10-CM | POA: Diagnosis not present

## 2014-02-12 DIAGNOSIS — M65352 Trigger finger, left little finger: Secondary | ICD-10-CM | POA: Diagnosis not present

## 2014-02-13 ENCOUNTER — Encounter: Payer: BLUE CROSS/BLUE SHIELD | Attending: "Endocrinology | Admitting: Nutrition

## 2014-02-13 ENCOUNTER — Ambulatory Visit: Payer: BC Managed Care – PPO | Admitting: Nutrition

## 2014-02-13 ENCOUNTER — Encounter: Payer: Self-pay | Admitting: Nutrition

## 2014-02-13 VITALS — Ht 69.0 in | Wt 223.0 lb

## 2014-02-13 DIAGNOSIS — E118 Type 2 diabetes mellitus with unspecified complications: Secondary | ICD-10-CM | POA: Diagnosis not present

## 2014-02-13 DIAGNOSIS — E1122 Type 2 diabetes mellitus with diabetic chronic kidney disease: Secondary | ICD-10-CM | POA: Diagnosis not present

## 2014-02-13 DIAGNOSIS — I10 Essential (primary) hypertension: Secondary | ICD-10-CM | POA: Diagnosis not present

## 2014-02-13 DIAGNOSIS — IMO0002 Reserved for concepts with insufficient information to code with codable children: Secondary | ICD-10-CM

## 2014-02-13 DIAGNOSIS — E782 Mixed hyperlipidemia: Secondary | ICD-10-CM | POA: Diagnosis not present

## 2014-02-13 DIAGNOSIS — Z794 Long term (current) use of insulin: Secondary | ICD-10-CM | POA: Insufficient documentation

## 2014-02-13 DIAGNOSIS — E1165 Type 2 diabetes mellitus with hyperglycemia: Secondary | ICD-10-CM

## 2014-02-13 DIAGNOSIS — Z713 Dietary counseling and surveillance: Secondary | ICD-10-CM | POA: Diagnosis not present

## 2014-02-13 NOTE — Progress Notes (Signed)
Encounter opened in Error

## 2014-02-13 NOTE — Patient Instructions (Signed)
Plan:  Aim for 3 Carb Choices per meal (45 grams) +/- 1 either way  Avoid snacks between meals Bake and broil all foods;avoid fried foods Take Novolog before meals and then eat after injecting insulin. Keep a food journal and bring at next visit. Include protein in moderation with your meals  Consider reading food labels for Total Carbohydrate and Fat Grams of foods Consider  increasing your activity level by 15 for 30 minutes daily as tolerated---Consider joining the [employer] for water aerobics. Continue  checking BG at alternate times per day as directed by MD  Consider taking medication as directed by MD Goal: Get your A1C down to 9% in three months. 2. Lose 1lb per week. 3. Follow the Plate Method

## 2014-02-13 NOTE — Progress Notes (Signed)
  Medical Nutrition Therapy:  Appt start time: 1100 end time:  1200.   Assessment:  Primary concerns today: Diabetes. Most recent A1C 12.2%. She is on 80 units of Levemir at night and 12 units of Novolog with meals now. BS have been in the 200's. Wanted to be seen today instead of next week since she was already here and lives far away. Has back issues and limited with physical activity. Wants to improve her BS and get her weight off to improve back pain. Also taking VIctoza daily. Drives a school bus daily.  Preferred Learning Style:   No preference indicated   Learning Readiness:   Ready  Change in progress   MEDICATIONS: see list   DIETARY INTAKE:     24-hr recall:    Eats 2-3 meals per day. Has been trying to bake and broil foods. Reports taking insulin as prescribed at night and with meals. Willing to work on eating better balanced meals to improve blood sugars.  Beverages: water, unsweet tea  Usual physical activity: limited due to back pain  Estimated energy needs: 1600 calories 180 g carbohydrates 120 g protein 44 g fat  Progress Towards Goal(s):  In progress.   Nutritional Diagnosis:  NB-1.1 Food and nutrition-related knowledge deficit As related to Diabetes.  As evidenced by A1C 12.2%.    Intervention:  Nutrition counseling and diabetes education on diet, testing, complications and importance of weight loss. Plan:  Aim for 3 Carb Choices per meal (45 grams) +/- 1 either way  Avoid snacks between meals Bake and broil all foods;avoid fried foods Take Novolog before meals and then eat after injecting insulin. Keep a food journal and bring at next visit. Include protein in moderation with your meals  Consider reading food labels for Total Carbohydrate and Fat Grams of foods Consider  increasing your activity level by 15 for 30 minutes daily as tolerated---Consider joining the [employer] for water aerobics. Continue  checking BG at alternate times per day as directed  by MD  Consider taking medication as directed by MD Goal: Get your A1C down to 9% in three months. 2. Lose 1lb per week. 3. Follow the Plate Method  Teaching Method Utilized:  Visual Auditory Hands on  Handouts given during visit include: The Plate Method Carb Counting and Food Label handouts Meal Plan Card  Barriers to learning/adherence to lifestyle change: none  Demonstrated degree of understanding via:  Teach Back   Monitoring/Evaluation:  Dietary intake, exercise, meal planning, SBG, and body weight in 1 month(s).

## 2014-02-20 ENCOUNTER — Encounter: Payer: BC Managed Care – PPO | Admitting: Nutrition

## 2014-02-24 DIAGNOSIS — M65352 Trigger finger, left little finger: Secondary | ICD-10-CM | POA: Diagnosis not present

## 2014-03-12 ENCOUNTER — Ambulatory Visit (INDEPENDENT_AMBULATORY_CARE_PROVIDER_SITE_OTHER): Payer: BC Managed Care – PPO | Admitting: Cardiovascular Disease

## 2014-03-12 ENCOUNTER — Encounter: Payer: Self-pay | Admitting: Cardiovascular Disease

## 2014-03-12 VITALS — BP 164/62 | HR 52 | Ht 70.0 in | Wt 222.0 lb

## 2014-03-12 DIAGNOSIS — Z794 Long term (current) use of insulin: Secondary | ICD-10-CM | POA: Diagnosis not present

## 2014-03-12 DIAGNOSIS — E785 Hyperlipidemia, unspecified: Secondary | ICD-10-CM

## 2014-03-12 DIAGNOSIS — I10 Essential (primary) hypertension: Secondary | ICD-10-CM | POA: Diagnosis not present

## 2014-03-12 DIAGNOSIS — R5383 Other fatigue: Secondary | ICD-10-CM | POA: Diagnosis not present

## 2014-03-12 DIAGNOSIS — E119 Type 2 diabetes mellitus without complications: Secondary | ICD-10-CM | POA: Diagnosis not present

## 2014-03-12 DIAGNOSIS — R001 Bradycardia, unspecified: Secondary | ICD-10-CM | POA: Diagnosis not present

## 2014-03-12 DIAGNOSIS — I2510 Atherosclerotic heart disease of native coronary artery without angina pectoris: Secondary | ICD-10-CM | POA: Diagnosis not present

## 2014-03-12 DIAGNOSIS — IMO0001 Reserved for inherently not codable concepts without codable children: Secondary | ICD-10-CM

## 2014-03-12 MED ORDER — METOPROLOL TARTRATE 25 MG PO TABS: ORAL_TABLET | ORAL | Status: DC

## 2014-03-12 NOTE — Progress Notes (Signed)
Patient ID: [name], female   DOB: 1945/04/23, 69 y.o.   MRN: [id number]      SUBJECTIVE: The patient is a 69 year old woman who presents for routine follow up. She has a history of coronary artery disease and underwent percutaneous coronary intervention of the mid circumflex with 2 overlapping Taxus drug-eluting stents by Dr. Eustace Quail in 2008. She had diffuse disease in the left anterior descending with 40% proximal, 70% mid and 80% distal stenosis and 80% stenosis in the first diagonal branch by cath in 03/2006. Left ventriculography at the time demonstrated normal left ventricular systolic function. Echo in 01/2012 demonstrated vigorous left ventricular systolic function, EF 42-35%. She also has hypertension, hyperlipidemia, and insulin-dependent diabetes mellitus. She denies chest pain and shortness of breath, but did not have chest pain or shortness of breath even prior to her stents.  She had previously been complaining of fatigue at her last visit in the summer of 2015, and I scheduled her for coronary angiography. Creatinine was elevated at 2.2 and cath was cancelled. Torsemide was subsequently reduced and creatinine was 1.7. However, cath was never rescheduled.  She is doing well today and denies chest tightness and shortness of breath. She feels fatigued approximately 2 days per week but also says she does not get great sleep. She has reportedly been studied for sleep apnea and denies a history of this. She is able to perform her activities of daily living without limitations. She is able to climb stairs at home with no exertional dyspnea or fatigue. She sees Dr. Dorris Fetch (endocrinology) for her diabetes management. She checks her blood pressures at home and they are typically are within the normal range.  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   Review of Systems: As per "subjective", otherwise negative.  Allergies  Allergen Reactions  . Ace Inhibitors   . Codeine Rash     Current Outpatient Prescriptions  Medication Sig Dispense Refill  . ALPRAZolam (XANAX) 0.5 MG tablet Take 0.5 mg by mouth at bedtime as needed.      Marland Kitchen aspirin EC 81 MG tablet Take 81 mg by mouth daily.    . benzonatate (TESSALON) 200 MG capsule AS NEEDED    . clopidogrel (PLAVIX) 75 MG tablet TAKE 1 TABLET BY MOUTH EVERY DAY EMERGENCY REFILL FAXED DR 30 tablet 6  . HYDROcodone-homatropine (HYCODAN) 5-1.5 MG/5ML syrup     . insulin aspart (NOVOLOG) 100 UNIT/ML injection Inject  Under skin 10 units 2x a day + SSI = max 30 units a day 3 vial PRN  . insulin detemir (LEVEMIR) 100 UNIT/ML injection Inject 60 Units into the skin at bedtime.    Marland Kitchen latanoprost (XALATAN) 0.005 % ophthalmic solution BOTH EYES 1 DROP AT BEDTIME    . meclizine (ANTIVERT) 12.5 MG tablet AS NEEDED FOR DIZZINESS    . metoprolol (LOPRESSOR) 50 MG tablet TAKE ONE TABLET BY MOUTH TWICE DAILY 180 tablet 2  . Olmesartan-Amlodipine-HCTZ (TRIBENZOR) 40-10-25 MG TABS Take 1 tablet by mouth daily. 30 tablet   . Omega-3 Fatty Acids (FISH OIL TRIPLE STRENGTH PO) Take 1,240 mg by mouth.    . pantoprazole (PROTONIX) 40 MG tablet Take 1 tablet (40 mg total) by mouth daily. 90 tablet 3  . rosuvastatin (CRESTOR) 20 MG tablet Take 20 mg by mouth daily.      Marland Kitchen torsemide (DEMADEX) 20 MG tablet Take 1 tablet (20 mg total) by mouth daily.    Marland Kitchen trimethoprim (TRIMPEX) 100 MG tablet 1 TAB DAILY  No current facility-administered medications for this visit.    Past Medical History  Diagnosis Date  . Hypertension   . Hyperlipidemia   . Diabetes mellitus   . Obesity   . GERD (gastroesophageal reflux disease)   . Heart murmur, systolic   . Anxiety   . Constipation   . Overactive bladder   . Allergic rhinitis   . Low back pain   . Osteoarthritis   . History of hysterectomy     Past Surgical History  Procedure Laterality Date  . Carpal tunnel release    . Trigger finger release    . Cervical biopsy      cervical lymph node  biopsies  . Back surgery      multiple  . Coronary stent placement  04/11/2006    2 -- Taxus stents to the circumflex   . Cardiac catheterization  04/04/2006    Est EF of 60%  . Abdominal hysterectomy    . Tendonitis      bilateral elbow  . Colonoscopy  May 2002    Dr. Irving Shows :Followup in 5 years, normal exam  . Colonoscopy  2008    Dr. Laural Golden: Very redundant colon with mild melanosis coli, splenic flexure polyp biopsy with acute complaint of benign colon polyp. Recommended ten-year followup    History   Social History  . Marital Status: Married    Spouse Name: N/A    Number of Children: 3  . Years of Education: N/A   Occupational History  . Not on file.   Social History Main Topics  . Smoking status: Never Smoker   . Smokeless tobacco: Not on file  . Alcohol Use: No  . Drug Use: No  . Sexual Activity: Not on file   Other Topics Concern  . Not on file   Social History Narrative    SpO2 99%  Weight 222 lb (100.699 kg) Height 5\' 10"  (1.778 m)  BP 164/62  Pulse 52    PHYSICAL EXAM General: NAD HEENT: Normal. Neck: No JVD, no thyromegaly. Lungs: Clear to auscultation bilaterally with normal respiratory effort. CV: Nondisplaced PMI.  Regular rate and rhythm, normal S1/S2, no S3/S4, no murmur. No pretibial or periankle edema.  No carotid bruit.  Normal pedal pulses.  Abdomen: Soft, nontender, no hepatosplenomegaly, no distention.  Neurologic: Alert and oriented x 3.  Psych: Normal affect. Skin: Normal. Musculoskeletal: Normal range of motion, no gross deformities. Extremities: No clubbing or cyanosis.   ECG: Most recent ECG reviewed.    ASSESSMENT AND PLAN: 1. CAD with occasional fatigue and bradycardia: Stable ischemic heart disease. She had severe LAD disease in 2008 and had a 40% mid RCA lesion at that time as well. I will continue ASA 81 mg daily and Plavix along with metoprolol (reduce to 25 mg bid due to fatigue and bradycardia) and Crestor. 2.  Essential HTN: She appears to have white coat hypertension. She checks her blood pressure routinely at home and it normally runs in the 130/70 mmHg range. I have asked her to continue to monitor this to see if further antihypertensive titration is warranted.  3. Hyperlipidemia: On Crestor 20 mg daily. Will order LFT's and lipids. 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   Kate Sable, M.D., F.A.C.C.

## 2014-03-12 NOTE — Patient Instructions (Addendum)
Your physician wants you to follow-up in: 6 months with Dr. Bronson Ing. You will receive a reminder letter in the mail two months in advance. If you don't receive a letter, please call our office to schedule the follow-up appointment.  Your physician recommends that you continue on your current medications as directed. Please refer to the Current Medication list given to you today.  Decrease Metoprolol to 25 mg twice daily  Thank you for choosing [facility]!

## 2014-03-19 ENCOUNTER — Encounter: Payer: Self-pay | Admitting: Nutrition

## 2014-03-19 ENCOUNTER — Encounter: Payer: BC Managed Care – PPO | Attending: Orthopedic Surgery | Admitting: Nutrition

## 2014-03-19 VITALS — Ht 71.0 in | Wt 220.0 lb

## 2014-03-19 DIAGNOSIS — E1129 Type 2 diabetes mellitus with other diabetic kidney complication: Secondary | ICD-10-CM | POA: Diagnosis not present

## 2014-03-19 DIAGNOSIS — E1122 Type 2 diabetes mellitus with diabetic chronic kidney disease: Secondary | ICD-10-CM | POA: Diagnosis not present

## 2014-03-19 DIAGNOSIS — E782 Mixed hyperlipidemia: Secondary | ICD-10-CM | POA: Diagnosis not present

## 2014-03-19 DIAGNOSIS — E118 Type 2 diabetes mellitus with unspecified complications: Secondary | ICD-10-CM | POA: Insufficient documentation

## 2014-03-19 DIAGNOSIS — E1121 Type 2 diabetes mellitus with diabetic nephropathy: Secondary | ICD-10-CM

## 2014-03-19 DIAGNOSIS — I10 Essential (primary) hypertension: Secondary | ICD-10-CM | POA: Diagnosis not present

## 2014-03-19 DIAGNOSIS — E1165 Type 2 diabetes mellitus with hyperglycemia: Secondary | ICD-10-CM | POA: Insufficient documentation

## 2014-03-19 DIAGNOSIS — IMO0002 Reserved for concepts with insufficient information to code with codable children: Secondary | ICD-10-CM

## 2014-03-19 DIAGNOSIS — Z713 Dietary counseling and surveillance: Secondary | ICD-10-CM | POA: Diagnosis not present

## 2014-03-19 NOTE — Progress Notes (Signed)
  Medical Nutrition Therapy:  Appt start time: 1100 end time:  1130.  Assessment:  Primary concerns today: Diabetes. Most recent A1C 12.2%. 80 of Levemir at bedttime and 15 units before meals. BS are still very poorly controlled. She denies being noncompliant with insulin but may not take it during the day if she isn't eating. Drives a school bus. She is not eating CHO at some meals. Meals are inconsistent. Kidney function is compromised and she sees a kidney specialist. She is at high risk for DM complications due to uncontrolled DM. She is willing to do better with diet and her medications.  Preferred Learning Style:   No preference indicated   Learning Readiness:   Ready  Change in progress  MEDICATIONS: see list   DIETARY INTAKE:   24-hr recall:    B) 2 boiled eggs and coffee  L) Baked fish, toss salad, water  D) Baked potato, baked fish and colelslaw and 2-3 hushpuppies Beverages: water,  Usual physical activity: limited due to back pain  Estimated energy needs: 1600 calories 180 g carbohydrates 120 g protein 44 g fat  Progress Towards Goal(s):  In progress.   Nutritional Diagnosis:  NB-1.1 Food and nutrition-related knowledge deficit As related to Diabetes.  As evidenced by A1C 12.2%.    Intervention:  Nutrition counseling and diabetes education on diet, testing, complications and importance of weight loss. Plan:  Need to work harder following the Plate Method we discussed. Increase fresh vegetables and fresh fruit with meals. Do not skip meals. Aim for 3 Carb Choices per meal (45 grams) +/- 1 either way  Avoid snacks between meals Bake and broil all foods;avoid fried foods Take Novolog before meals and then eat after injecting insulin. Keep a food journal and bring at next visit. Consider  increasing your activity level by 15 for 30 minutes daily as tolerated---Consider joining the [employer] for water aerobics. Continue  checking BG at alternate times per day as  directed by MD  Goal: Get your A1C down to 9% in three months. 2. Lose 1lb per week. 3. Follow the Plate Method  Teaching Method Utilized:  Visual Auditory Hands on  Handouts given during visit include: The Plate Method Carb Counting and Food Label handouts Meal Plan Card  Barriers to learning/adherence to lifestyle change: none  Demonstrated degree of understanding via:  Teach Back   Monitoring/Evaluation:  Dietary intake, exercise, meal planning, SBG, and body weight in 1 month(s).

## 2014-03-19 NOTE — Patient Instructions (Signed)
Plan:  Need to work harder following the Plate Method we discussed. Increase fresh vegetables and fresh fruit with meals. Do not skip meals. Aim for 3 Carb Choices per meal (45 grams) +/- 1 either way  Avoid snacks between meals Bake and broil all foods;avoid fried foods Take Novolog before meals and then eat after injecting insulin. Keep a food journal and bring at next visit. Consider  increasing your activity level by 15 for 30 minutes daily as tolerated---Consider joining the [employer] for water aerobics. Continue  checking BG at alternate times per day as directed by MD  Goal: Get your A1C down to 9% in three months. 2. Lose 1lb per week. 3. Follow the Plate Method

## 2014-04-08 DIAGNOSIS — Z1231 Encounter for screening mammogram for malignant neoplasm of breast: Secondary | ICD-10-CM | POA: Diagnosis not present

## 2014-04-18 DIAGNOSIS — J069 Acute upper respiratory infection, unspecified: Secondary | ICD-10-CM | POA: Diagnosis not present

## 2014-04-29 DIAGNOSIS — Z794 Long term (current) use of insulin: Secondary | ICD-10-CM | POA: Diagnosis not present

## 2014-04-29 DIAGNOSIS — N183 Chronic kidney disease, stage 3 (moderate): Secondary | ICD-10-CM | POA: Diagnosis not present

## 2014-04-29 DIAGNOSIS — I129 Hypertensive chronic kidney disease with stage 1 through stage 4 chronic kidney disease, or unspecified chronic kidney disease: Secondary | ICD-10-CM | POA: Diagnosis not present

## 2014-04-29 DIAGNOSIS — E119 Type 2 diabetes mellitus without complications: Secondary | ICD-10-CM | POA: Diagnosis not present

## 2014-04-30 ENCOUNTER — Encounter: Payer: Medicare Other | Attending: "Endocrinology | Admitting: Nutrition

## 2014-04-30 DIAGNOSIS — Z713 Dietary counseling and surveillance: Secondary | ICD-10-CM | POA: Insufficient documentation

## 2014-04-30 DIAGNOSIS — Z794 Long term (current) use of insulin: Secondary | ICD-10-CM | POA: Insufficient documentation

## 2014-04-30 DIAGNOSIS — E118 Type 2 diabetes mellitus with unspecified complications: Secondary | ICD-10-CM | POA: Insufficient documentation

## 2014-05-06 DIAGNOSIS — M722 Plantar fascial fibromatosis: Secondary | ICD-10-CM | POA: Diagnosis not present

## 2014-05-06 DIAGNOSIS — E1342 Other specified diabetes mellitus with diabetic polyneuropathy: Secondary | ICD-10-CM | POA: Diagnosis not present

## 2014-05-06 DIAGNOSIS — E119 Type 2 diabetes mellitus without complications: Secondary | ICD-10-CM | POA: Diagnosis not present

## 2014-05-06 DIAGNOSIS — I10 Essential (primary) hypertension: Secondary | ICD-10-CM | POA: Diagnosis not present

## 2014-05-06 DIAGNOSIS — B351 Tinea unguium: Secondary | ICD-10-CM | POA: Diagnosis not present

## 2014-05-22 DIAGNOSIS — E782 Mixed hyperlipidemia: Secondary | ICD-10-CM | POA: Diagnosis not present

## 2014-05-22 DIAGNOSIS — E1122 Type 2 diabetes mellitus with diabetic chronic kidney disease: Secondary | ICD-10-CM | POA: Diagnosis not present

## 2014-05-22 DIAGNOSIS — I10 Essential (primary) hypertension: Secondary | ICD-10-CM | POA: Diagnosis not present

## 2014-05-22 DIAGNOSIS — Z713 Dietary counseling and surveillance: Secondary | ICD-10-CM | POA: Diagnosis not present

## 2014-05-26 DIAGNOSIS — H4011X4 Primary open-angle glaucoma, indeterminate stage: Secondary | ICD-10-CM | POA: Diagnosis not present

## 2014-05-26 DIAGNOSIS — E11329 Type 2 diabetes mellitus with mild nonproliferative diabetic retinopathy without macular edema: Secondary | ICD-10-CM | POA: Diagnosis not present

## 2014-05-26 DIAGNOSIS — Z794 Long term (current) use of insulin: Secondary | ICD-10-CM | POA: Diagnosis not present

## 2014-05-28 LAB — HM DIABETES EYE EXAM

## 2014-05-29 DIAGNOSIS — I10 Essential (primary) hypertension: Secondary | ICD-10-CM | POA: Diagnosis not present

## 2014-05-29 DIAGNOSIS — E785 Hyperlipidemia, unspecified: Secondary | ICD-10-CM | POA: Diagnosis not present

## 2014-05-29 DIAGNOSIS — E1122 Type 2 diabetes mellitus with diabetic chronic kidney disease: Secondary | ICD-10-CM | POA: Diagnosis not present

## 2014-06-04 DIAGNOSIS — Z79899 Other long term (current) drug therapy: Secondary | ICD-10-CM | POA: Diagnosis not present

## 2014-06-04 DIAGNOSIS — M7989 Other specified soft tissue disorders: Secondary | ICD-10-CM | POA: Diagnosis not present

## 2014-06-04 DIAGNOSIS — I8392 Asymptomatic varicose veins of left lower extremity: Secondary | ICD-10-CM | POA: Diagnosis not present

## 2014-06-04 DIAGNOSIS — I10 Essential (primary) hypertension: Secondary | ICD-10-CM | POA: Diagnosis not present

## 2014-06-04 DIAGNOSIS — K219 Gastro-esophageal reflux disease without esophagitis: Secondary | ICD-10-CM | POA: Diagnosis not present

## 2014-06-04 DIAGNOSIS — M779 Enthesopathy, unspecified: Secondary | ICD-10-CM | POA: Diagnosis not present

## 2014-06-04 DIAGNOSIS — M25572 Pain in left ankle and joints of left foot: Secondary | ICD-10-CM | POA: Diagnosis not present

## 2014-06-04 DIAGNOSIS — E119 Type 2 diabetes mellitus without complications: Secondary | ICD-10-CM | POA: Diagnosis not present

## 2014-06-04 DIAGNOSIS — E780 Pure hypercholesterolemia: Secondary | ICD-10-CM | POA: Diagnosis not present

## 2014-06-26 DIAGNOSIS — M25572 Pain in left ankle and joints of left foot: Secondary | ICD-10-CM | POA: Diagnosis not present

## 2014-06-26 DIAGNOSIS — M79605 Pain in left leg: Secondary | ICD-10-CM | POA: Diagnosis not present

## 2014-07-08 DIAGNOSIS — I10 Essential (primary) hypertension: Secondary | ICD-10-CM | POA: Diagnosis not present

## 2014-07-08 DIAGNOSIS — E119 Type 2 diabetes mellitus without complications: Secondary | ICD-10-CM | POA: Diagnosis not present

## 2014-07-13 DIAGNOSIS — R197 Diarrhea, unspecified: Secondary | ICD-10-CM | POA: Diagnosis not present

## 2014-07-13 DIAGNOSIS — R110 Nausea: Secondary | ICD-10-CM | POA: Diagnosis not present

## 2014-07-18 DIAGNOSIS — H43811 Vitreous degeneration, right eye: Secondary | ICD-10-CM | POA: Diagnosis not present

## 2014-07-29 DIAGNOSIS — R350 Frequency of micturition: Secondary | ICD-10-CM | POA: Diagnosis not present

## 2014-07-29 DIAGNOSIS — N3946 Mixed incontinence: Secondary | ICD-10-CM | POA: Diagnosis not present

## 2014-08-01 DIAGNOSIS — E11329 Type 2 diabetes mellitus with mild nonproliferative diabetic retinopathy without macular edema: Secondary | ICD-10-CM | POA: Diagnosis not present

## 2014-08-01 DIAGNOSIS — H43811 Vitreous degeneration, right eye: Secondary | ICD-10-CM | POA: Diagnosis not present

## 2014-08-07 ENCOUNTER — Other Ambulatory Visit: Payer: Self-pay | Admitting: Cardiovascular Disease

## 2014-08-08 DIAGNOSIS — M25572 Pain in left ankle and joints of left foot: Secondary | ICD-10-CM | POA: Diagnosis not present

## 2014-08-25 LAB — HEMOGLOBIN A1C: HEMOGLOBIN A1C: 10.1 % — AB (ref 4.0–6.0)

## 2014-08-29 DIAGNOSIS — I10 Essential (primary) hypertension: Secondary | ICD-10-CM | POA: Diagnosis not present

## 2014-08-29 DIAGNOSIS — E1122 Type 2 diabetes mellitus with diabetic chronic kidney disease: Secondary | ICD-10-CM | POA: Diagnosis not present

## 2014-08-29 DIAGNOSIS — E785 Hyperlipidemia, unspecified: Secondary | ICD-10-CM | POA: Diagnosis not present

## 2014-09-02 DIAGNOSIS — D631 Anemia in chronic kidney disease: Secondary | ICD-10-CM | POA: Diagnosis not present

## 2014-09-02 DIAGNOSIS — N182 Chronic kidney disease, stage 2 (mild): Secondary | ICD-10-CM | POA: Diagnosis not present

## 2014-09-02 DIAGNOSIS — N189 Chronic kidney disease, unspecified: Secondary | ICD-10-CM | POA: Diagnosis not present

## 2014-09-02 DIAGNOSIS — I129 Hypertensive chronic kidney disease with stage 1 through stage 4 chronic kidney disease, or unspecified chronic kidney disease: Secondary | ICD-10-CM | POA: Diagnosis not present

## 2014-09-02 DIAGNOSIS — E1140 Type 2 diabetes mellitus with diabetic neuropathy, unspecified: Secondary | ICD-10-CM | POA: Diagnosis not present

## 2014-09-02 DIAGNOSIS — I10 Essential (primary) hypertension: Secondary | ICD-10-CM | POA: Diagnosis not present

## 2014-09-02 DIAGNOSIS — E1122 Type 2 diabetes mellitus with diabetic chronic kidney disease: Secondary | ICD-10-CM | POA: Diagnosis not present

## 2014-09-02 DIAGNOSIS — N183 Chronic kidney disease, stage 3 (moderate): Secondary | ICD-10-CM | POA: Diagnosis not present

## 2014-09-11 ENCOUNTER — Other Ambulatory Visit (HOSPITAL_COMMUNITY): Payer: Self-pay | Admitting: *Deleted

## 2014-09-12 ENCOUNTER — Ambulatory Visit (HOSPITAL_COMMUNITY)
Admission: RE | Admit: 2014-09-12 | Discharge: 2014-09-12 | Disposition: A | Discharge status: Home / Self Care | Payer: BLUE CROSS/BLUE SHIELD | Source: Ambulatory Visit | Attending: Nephrology | Admitting: Nephrology

## 2014-09-12 DIAGNOSIS — D509 Iron deficiency anemia, unspecified: Secondary | ICD-10-CM | POA: Diagnosis not present

## 2014-09-12 MED ORDER — FERUMOXYTOL INJECTION 510 MG/17 ML
510.0000 mg | Freq: Once | INTRAVENOUS | Status: AC
  Administered 2014-09-12: 510 mg via INTRAVENOUS
  Filled 2014-09-12: qty 17

## 2014-09-12 NOTE — Discharge Instructions (Signed)

## 2014-09-18 ENCOUNTER — Encounter: Payer: Self-pay | Admitting: Cardiovascular Disease

## 2014-09-18 ENCOUNTER — Encounter: Payer: Self-pay | Admitting: *Deleted

## 2014-09-18 ENCOUNTER — Ambulatory Visit (INDEPENDENT_AMBULATORY_CARE_PROVIDER_SITE_OTHER): Payer: BLUE CROSS/BLUE SHIELD | Admitting: Cardiovascular Disease

## 2014-09-18 VITALS — BP 146/70 | HR 62 | Ht 70.0 in | Wt 220.0 lb

## 2014-09-18 DIAGNOSIS — IMO0001 Reserved for inherently not codable concepts without codable children: Secondary | ICD-10-CM

## 2014-09-18 DIAGNOSIS — I2510 Atherosclerotic heart disease of native coronary artery without angina pectoris: Secondary | ICD-10-CM | POA: Diagnosis not present

## 2014-09-18 DIAGNOSIS — N189 Chronic kidney disease, unspecified: Secondary | ICD-10-CM

## 2014-09-18 DIAGNOSIS — E785 Hyperlipidemia, unspecified: Secondary | ICD-10-CM

## 2014-09-18 DIAGNOSIS — R5383 Other fatigue: Secondary | ICD-10-CM

## 2014-09-18 DIAGNOSIS — E119 Type 2 diabetes mellitus without complications: Secondary | ICD-10-CM | POA: Diagnosis not present

## 2014-09-18 DIAGNOSIS — I10 Essential (primary) hypertension: Secondary | ICD-10-CM

## 2014-09-18 DIAGNOSIS — Z794 Long term (current) use of insulin: Secondary | ICD-10-CM

## 2014-09-18 NOTE — Progress Notes (Signed)
Patient ID: JACYLN CARMER, female   DOB: 1945-05-26, 69 y.o.   MRN: [id number]      SUBJECTIVE: The patient is a 69 year old woman who presents for routine follow up. She has a history of coronary artery disease and underwent percutaneous coronary intervention of the mid circumflex with 2 overlapping Taxus drug-eluting stents by Dr. Eustace Quail in 2008. She had diffuse disease in the left anterior descending with 40% proximal, 70% mid and 80% distal stenosis and 80% stenosis in the first diagonal branch by cath in 03/2006. Left ventriculography at the time demonstrated normal left ventricular systolic function.  Echo in 01/2012 demonstrated vigorous left ventricular systolic function, EF 09-23%.  She also has hypertension, hyperlipidemia, and insulin-dependent diabetes mellitus.  She denies chest pain and shortness of breath, but did not have chest pain or shortness of breath even prior to her stents.   She had previously been complaining of fatigue at aprior visit in the summer of 2015, and I scheduled her for coronary angiography. Creatinine was elevated at 2.2 and cath was cancelled. Torsemide was subsequently reduced and creatinine was 1.7. However, cath was never rescheduled.  She is doing well today and denies chest tightness and shortness of breath.  She sees Dr. Dorris Fetch (endocrinology) for her diabetes management and Dr. Lorrene Reid, her nephrologist, in [place].. She checks her blood pressures at home and they are 130-140/60-70.  She received some iron injections for anemia and she feels and her fatigue levels have improved. She has some leg fatigue and lower back pain. 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 She reportedly had her lipids and CBC checked in [place] at Dr. Sanda Klein office recently.   ECG performed in the office today demonstrates normal sinus rhythm with a diffuse ST segment and T-wave abnormality suggestive of  inferior and anterolateral ischemia. These are not markedly different from abnormalities seen in July 2015.  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   Review of Systems: As per "subjective", otherwise negative.  Allergies  Allergen Reactions  . Ace Inhibitors   . Codeine Rash    Current Outpatient Prescriptions  Medication Sig Dispense Refill  . ALPRAZolam (XANAX) 0.5 MG tablet Take 0.5 mg by mouth at bedtime as needed.      Marland Kitchen aspirin EC 81 MG tablet Take 81 mg by mouth daily.    . benzonatate (TESSALON) 200 MG capsule AS NEEDED    . clopidogrel (PLAVIX) 75 MG tablet TAKE 1 TABLET BY MOUTH EVERY DAY 30 tablet 3  . famotidine (PEPCID) 20 MG tablet Take 20 mg by mouth daily.    Marland Kitchen gabapentin (NEURONTIN) 100 MG capsule Take 100 mg by mouth as needed.    Marland Kitchen HYDROcodone-homatropine (HYCODAN) 5-1.5 MG/5ML syrup     . insulin aspart (NOVOLOG) 100 UNIT/ML injection Inject  Under skin 10 units 2x a day + SSI = max 30 units a day 3 vial PRN  . insulin detemir (LEVEMIR) 100 UNIT/ML injection Inject 60 Units into the skin at bedtime.    Marland Kitchen latanoprost (XALATAN) 0.005 % ophthalmic solution BOTH EYES 1 DROP AT BEDTIME    . meclizine (ANTIVERT) 12.5 MG tablet AS NEEDED FOR DIZZINESS    . metoprolol (LOPRESSOR) 25 MG tablet TAKE ONE TABLET BY MOUTH TWICE DAILY 60 tablet 6  . Olmesartan-Amlodipine-HCTZ (TRIBENZOR) 40-10-25 MG TABS Take 1 tablet by mouth daily. 30 tablet   . Omega-3 Fatty Acids (FISH OIL TRIPLE STRENGTH PO) Take 3,007  mg by mouth.    . rosuvastatin (CRESTOR) 20 MG tablet Take 20 mg by mouth daily.      Marland Kitchen torsemide (DEMADEX) 20 MG tablet Take 1 tablet (20 mg total) by mouth daily.    Marland Kitchen trimethoprim (TRIMPEX) 100 MG tablet 1 TAB DAILY     No current facility-administered medications for this visit.    Past Medical History  Diagnosis Date  . Hypertension   . Hyperlipidemia   . Diabetes mellitus   . Obesity   . GERD (gastroesophageal reflux disease)   . Heart murmur, systolic   . Anxiety    . Constipation   . Overactive bladder   . Allergic rhinitis   . Low back pain   . Osteoarthritis   . History of hysterectomy     Past Surgical History  Procedure Laterality Date  . Carpal tunnel release    . Trigger finger release    . Cervical biopsy      cervical lymph node biopsies  . Back surgery      multiple  . Coronary stent placement  04/11/2006    2 -- Taxus stents to the circumflex   . Cardiac catheterization  04/04/2006    Est EF of 60%  . Abdominal hysterectomy    . Tendonitis      bilateral elbow  . Colonoscopy  May 2002    Dr. Irving Shows :Followup in 5 years, normal exam  . Colonoscopy  2008    Dr. Laural Golden: Very redundant colon with mild melanosis coli, splenic flexure polyp biopsy with acute complaint of benign colon polyp. Recommended ten-year followup    History   Social History  . Marital Status: Married    Spouse Name: N/A  . Number of Children: 3  . Years of Education: N/A   Occupational History  . Not on file.   Social History Main Topics  . Smoking status: Never Smoker   . Smokeless tobacco: Never Used  . Alcohol Use: No  . Drug Use: No  . Sexual Activity: Not on file   Other Topics Concern  . Not on file   Social History Narrative     Filed Vitals:   09/18/14 1050  BP: 146/70  Pulse: 62  Height: 5\' 10"  (1.778 m)  Weight: 220 lb (99.791 kg)  SpO2: 99%    PHYSICAL EXAM General: NAD HEENT: Normal. Neck: No JVD, no thyromegaly. Lungs: Clear to auscultation bilaterally with normal respiratory effort. CV: Nondisplaced PMI.  Regular rate and rhythm, normal S1/S2, no S3/S4, no murmur. No pretibial or periankle edema.  No carotid bruit.    Abdomen: Soft, nontender, obese, no distention.  Neurologic: Alert and oriented x 3.  Psych: Normal affect. Skin: Normal. Musculoskeletal: No gross deformities. Extremities: No clubbing or cyanosis.   ECG: Most recent ECG reviewed.      ASSESSMENT AND PLAN: 1. CAD with occasional  fatigue: Stable ischemic heart disease. Symptom improvement with iron injections. She had severe LAD disease in 2008 and had a 40% mid RCA lesion at that time as well. I will continue ASA 81 mg daily and Plavix along with metoprolol and Crestor.  2. Essential HTN: She reportedly has white coat hypertension. BP at home 130-140/60-70. No changes to meds today.  3. Hyperlipidemia: On Crestor 20 mg daily. Will obtain copy of lipids.  4. IDDM: On Novolog and Levemir, and follows with endocrinology.  5. CKD: Follows with Dr. Lorrene Reid in [place].  Dispo: f/u 6 months.   Jamesetta So  Bronson Ing, M.D., F.A.C.C.

## 2014-09-18 NOTE — Patient Instructions (Signed)
   Referral to Cardiac Rehab  Continue all current medications. Your physician wants you to follow up in: 6 months.  You will receive a reminder letter in the mail one-two months in advance.  If you don't receive a letter, please call our office to schedule the follow up appointment

## 2014-09-27 DIAGNOSIS — J069 Acute upper respiratory infection, unspecified: Secondary | ICD-10-CM | POA: Diagnosis not present

## 2014-10-16 ENCOUNTER — Encounter (HOSPITAL_COMMUNITY): Payer: BLUE CROSS/BLUE SHIELD

## 2014-10-29 ENCOUNTER — Encounter (HOSPITAL_COMMUNITY): Payer: Self-pay

## 2014-10-29 ENCOUNTER — Encounter (HOSPITAL_COMMUNITY)
Admission: RE | Admit: 2014-10-29 | Discharge: 2014-10-29 | Disposition: A | Discharge status: Home / Self Care | Payer: BLUE CROSS/BLUE SHIELD | Source: Ambulatory Visit | Attending: Cardiovascular Disease | Admitting: Cardiovascular Disease

## 2014-10-29 VITALS — BP 138/60 | HR 57 | Ht 71.0 in | Wt 227.1 lb

## 2014-10-29 DIAGNOSIS — I25119 Atherosclerotic heart disease of native coronary artery with unspecified angina pectoris: Secondary | ICD-10-CM | POA: Diagnosis not present

## 2014-10-29 DIAGNOSIS — I2510 Atherosclerotic heart disease of native coronary artery without angina pectoris: Secondary | ICD-10-CM

## 2014-10-29 DIAGNOSIS — E1122 Type 2 diabetes mellitus with diabetic chronic kidney disease: Secondary | ICD-10-CM | POA: Diagnosis not present

## 2014-10-29 DIAGNOSIS — I10 Essential (primary) hypertension: Secondary | ICD-10-CM | POA: Diagnosis not present

## 2014-10-29 DIAGNOSIS — I208 Other forms of angina pectoris: Secondary | ICD-10-CM

## 2014-10-29 NOTE — Progress Notes (Signed)
Patient arrived for 1st visit/orientation/education at 23. Patient was referred to CR by Dr. Bronson Ing due to CAD (l20.8) and ASCVD (I25.10). During orientation advised patient on arrival and appointment times what to wear, what to do before, during and after exercise. Reviewed attendance and class policy. Talked about inclement weather and class consultation policy. Pt is scheduled to return Cardiac Rehab on November 03, 2014 at 0930. Pt was advised to come to class 5 minutes before class starts. She was also given instructions on meeting with the dietician and attending the Family Structure classes. Pt is eager to get started. Patient was not able to complete 6 minute walk test due to chronic back pain and fatigue.  Patient only to complete 3 minutes of the post walk test. Patient was measured for the equipment. Discussed equipment safety with patient. Took patient pre-anthropometric measurements. Patient finished visit at 1430.

## 2014-10-29 NOTE — Progress Notes (Signed)
Cardiac/Pulmonary Rehab Medication Review by a Pharmacist  Does the patient  feel that his/her medications are working for him/her?  yes  Has the patient been experiencing any side effects to the medications prescribed?  no  Does the patient measure his/her own blood pressure or blood glucose at home?  yes   Does the patient have any problems obtaining medications due to transportation or finances?   no  Understanding of regimen: excellent Understanding of indications: excellent Potential of compliance: good  Questions asked to Determine Patient Understanding of Medication Regimen:  1. What is the name of the medication?  2. What is the medication used for?  3. When should it be taken?  4. How much should be taken?  5. How will you take it?  6. What side effects should you report?  Understanding Defined as: Excellent: All questions above are correct Good: Questions 1-4 are correct Fair: Questions 1-2 are correct  Poor: 1 or none of the above questions are correct   Pharmacist comments: Pt experiences a cough with ACE inhibitors but is able to tolerate ARBs. She checks her blood sugar multiple times a day for her sliding scale insulin. She checks her blood pressure occasionally. She mentioned that her copay for Tresiba and insulin is high, but that she probably would not qualify for PAP. Advised to talk to doctor about getting the components of Tribenzor separately so it would be cheaper. Patient does not report any side effects.    Hart Robinsons A 10/29/2014 1:14 PM

## 2014-11-03 ENCOUNTER — Encounter (HOSPITAL_COMMUNITY)
Admission: RE | Admit: 2014-11-03 | Discharge: 2014-11-03 | Disposition: A | Discharge status: Home / Self Care | Payer: BLUE CROSS/BLUE SHIELD | Source: Ambulatory Visit | Attending: Cardiovascular Disease | Admitting: Cardiovascular Disease

## 2014-11-03 DIAGNOSIS — I25119 Atherosclerotic heart disease of native coronary artery with unspecified angina pectoris: Secondary | ICD-10-CM | POA: Diagnosis not present

## 2014-11-05 ENCOUNTER — Encounter (HOSPITAL_COMMUNITY)
Admission: RE | Admit: 2014-11-05 | Discharge: 2014-11-05 | Disposition: A | Discharge status: Home / Self Care | Payer: BLUE CROSS/BLUE SHIELD | Source: Ambulatory Visit | Attending: Cardiovascular Disease | Admitting: Cardiovascular Disease

## 2014-11-05 DIAGNOSIS — I25119 Atherosclerotic heart disease of native coronary artery with unspecified angina pectoris: Secondary | ICD-10-CM | POA: Diagnosis not present

## 2014-11-07 ENCOUNTER — Encounter (HOSPITAL_COMMUNITY): Payer: BLUE CROSS/BLUE SHIELD

## 2014-11-10 ENCOUNTER — Encounter (HOSPITAL_COMMUNITY): Payer: BLUE CROSS/BLUE SHIELD

## 2014-11-11 DIAGNOSIS — N183 Chronic kidney disease, stage 3 (moderate): Secondary | ICD-10-CM | POA: Diagnosis not present

## 2014-11-11 DIAGNOSIS — N189 Chronic kidney disease, unspecified: Secondary | ICD-10-CM | POA: Diagnosis not present

## 2014-11-12 ENCOUNTER — Encounter (HOSPITAL_COMMUNITY)
Admission: RE | Admit: 2014-11-12 | Discharge: 2014-11-12 | Disposition: A | Discharge status: Home / Self Care | Payer: BLUE CROSS/BLUE SHIELD | Source: Ambulatory Visit | Attending: Cardiovascular Disease | Admitting: Cardiovascular Disease

## 2014-11-12 DIAGNOSIS — I25119 Atherosclerotic heart disease of native coronary artery with unspecified angina pectoris: Secondary | ICD-10-CM | POA: Diagnosis not present

## 2014-11-12 NOTE — Progress Notes (Signed)
Cardiac Rehabilitation Program Outcomes Report   Orientation:  10/29/14 Graduate Date:  tbd Discharge Date:  tbd # of sessions completed: 3  Cardiologist: Bronson Ing Family MD:  Juliette Mangle Time:  0930  A.  Exercise Program:  Tolerates exercise @ 3.68 METS for 15 minutes and Walk Test Results:  Pre: 1.58 mets  B.  Mental Health:  Good mental attitude  C.  Education/Instruction/Skills  Accurately checks own pulse.  Rest:  57  Exercise:  65 and Knows THR for exercise  Uses Perceived Exertion Scale and/or Dyspnea Scale  D.  Nutrition/Weight Control/Body Composition:  Adherence to prescribed nutrition program: fair    E.  Blood Lipids   No results found for: CHOL, HDL, LDLCALC, LDLDIRECT, TRIG, CHOLHDL  F.  Lifestyle Changes:  Making positive lifestyle changes  G.  Symptoms noted with exercise:  Asymptomatic  Report Completed By:  Stevphen Rochester RN   Comments:  This is patients first week progress note for [facility].

## 2014-11-14 ENCOUNTER — Encounter (HOSPITAL_COMMUNITY)
Admission: RE | Admit: 2014-11-14 | Discharge: 2014-11-14 | Disposition: A | Discharge status: Home / Self Care | Payer: BLUE CROSS/BLUE SHIELD | Source: Ambulatory Visit | Attending: Cardiovascular Disease | Admitting: Cardiovascular Disease

## 2014-11-14 DIAGNOSIS — I25119 Atherosclerotic heart disease of native coronary artery with unspecified angina pectoris: Secondary | ICD-10-CM | POA: Diagnosis not present

## 2014-11-17 ENCOUNTER — Encounter (HOSPITAL_COMMUNITY): Payer: BLUE CROSS/BLUE SHIELD

## 2014-11-19 ENCOUNTER — Encounter (HOSPITAL_COMMUNITY)
Admission: RE | Admit: 2014-11-19 | Discharge: 2014-11-19 | Disposition: A | Discharge status: Home / Self Care | Payer: BLUE CROSS/BLUE SHIELD | Source: Ambulatory Visit | Attending: Cardiovascular Disease | Admitting: Cardiovascular Disease

## 2014-11-19 DIAGNOSIS — I25119 Atherosclerotic heart disease of native coronary artery with unspecified angina pectoris: Secondary | ICD-10-CM | POA: Diagnosis not present

## 2014-11-21 ENCOUNTER — Encounter: Payer: Self-pay | Admitting: "Endocrinology

## 2014-11-21 ENCOUNTER — Encounter (HOSPITAL_COMMUNITY)
Admission: RE | Admit: 2014-11-21 | Discharge: 2014-11-21 | Disposition: A | Discharge status: Home / Self Care | Payer: BLUE CROSS/BLUE SHIELD | Source: Ambulatory Visit | Attending: Cardiovascular Disease | Admitting: Cardiovascular Disease

## 2014-11-21 DIAGNOSIS — I25119 Atherosclerotic heart disease of native coronary artery with unspecified angina pectoris: Secondary | ICD-10-CM | POA: Diagnosis not present

## 2014-11-24 ENCOUNTER — Encounter (HOSPITAL_COMMUNITY): Payer: BLUE CROSS/BLUE SHIELD

## 2014-11-25 DIAGNOSIS — E119 Type 2 diabetes mellitus without complications: Secondary | ICD-10-CM | POA: Diagnosis not present

## 2014-11-25 DIAGNOSIS — Z794 Long term (current) use of insulin: Secondary | ICD-10-CM | POA: Diagnosis not present

## 2014-11-25 DIAGNOSIS — H4011X2 Primary open-angle glaucoma, moderate stage: Secondary | ICD-10-CM | POA: Diagnosis not present

## 2014-11-26 ENCOUNTER — Encounter (HOSPITAL_COMMUNITY): Payer: BLUE CROSS/BLUE SHIELD

## 2014-11-28 ENCOUNTER — Encounter (HOSPITAL_COMMUNITY): Payer: BLUE CROSS/BLUE SHIELD

## 2014-12-01 ENCOUNTER — Encounter (HOSPITAL_COMMUNITY): Payer: BLUE CROSS/BLUE SHIELD

## 2014-12-02 DIAGNOSIS — N182 Chronic kidney disease, stage 2 (mild): Secondary | ICD-10-CM | POA: Diagnosis not present

## 2014-12-02 DIAGNOSIS — E1122 Type 2 diabetes mellitus with diabetic chronic kidney disease: Secondary | ICD-10-CM | POA: Diagnosis not present

## 2014-12-02 DIAGNOSIS — I10 Essential (primary) hypertension: Secondary | ICD-10-CM | POA: Diagnosis not present

## 2014-12-02 DIAGNOSIS — Z23 Encounter for immunization: Secondary | ICD-10-CM | POA: Diagnosis not present

## 2014-12-03 ENCOUNTER — Encounter (HOSPITAL_COMMUNITY): Payer: BLUE CROSS/BLUE SHIELD

## 2014-12-03 ENCOUNTER — Other Ambulatory Visit: Payer: Self-pay

## 2014-12-03 ENCOUNTER — Other Ambulatory Visit: Payer: Self-pay | Admitting: Nephrology

## 2014-12-03 DIAGNOSIS — D631 Anemia in chronic kidney disease: Secondary | ICD-10-CM | POA: Diagnosis not present

## 2014-12-03 DIAGNOSIS — E1122 Type 2 diabetes mellitus with diabetic chronic kidney disease: Secondary | ICD-10-CM | POA: Diagnosis not present

## 2014-12-03 DIAGNOSIS — N184 Chronic kidney disease, stage 4 (severe): Secondary | ICD-10-CM

## 2014-12-03 DIAGNOSIS — I129 Hypertensive chronic kidney disease with stage 1 through stage 4 chronic kidney disease, or unspecified chronic kidney disease: Secondary | ICD-10-CM | POA: Diagnosis not present

## 2014-12-03 DIAGNOSIS — N189 Chronic kidney disease, unspecified: Secondary | ICD-10-CM | POA: Diagnosis not present

## 2014-12-03 DIAGNOSIS — N183 Chronic kidney disease, stage 3 (moderate): Secondary | ICD-10-CM | POA: Diagnosis not present

## 2014-12-03 MED ORDER — INSULIN ASPART 100 UNIT/ML FLEXPEN: 15.0000 [IU] | PEN_INJECTOR | Freq: Three times a day (TID) | SUBCUTANEOUS | Status: DC

## 2014-12-04 ENCOUNTER — Ambulatory Visit: Payer: BLUE CROSS/BLUE SHIELD

## 2014-12-04 ENCOUNTER — Encounter: Payer: Self-pay | Admitting: "Endocrinology

## 2014-12-04 ENCOUNTER — Ambulatory Visit (INDEPENDENT_AMBULATORY_CARE_PROVIDER_SITE_OTHER): Payer: BLUE CROSS/BLUE SHIELD | Admitting: "Endocrinology

## 2014-12-04 VITALS — HR 60 | Ht 69.0 in | Wt 225.0 lb

## 2014-12-04 DIAGNOSIS — N184 Chronic kidney disease, stage 4 (severe): Secondary | ICD-10-CM | POA: Diagnosis not present

## 2014-12-04 DIAGNOSIS — I2510 Atherosclerotic heart disease of native coronary artery without angina pectoris: Secondary | ICD-10-CM

## 2014-12-04 DIAGNOSIS — I10 Essential (primary) hypertension: Secondary | ICD-10-CM

## 2014-12-04 DIAGNOSIS — E1122 Type 2 diabetes mellitus with diabetic chronic kidney disease: Secondary | ICD-10-CM

## 2014-12-04 DIAGNOSIS — E785 Hyperlipidemia, unspecified: Secondary | ICD-10-CM | POA: Diagnosis not present

## 2014-12-04 NOTE — Progress Notes (Signed)
Subjective:    Patient ID: [name], female    DOB: 16-Jan-1946,    Past Medical History  Diagnosis Date  . Hypertension   . Hyperlipidemia   . Diabetes mellitus   . Obesity   . GERD (gastroesophageal reflux disease)   . Heart murmur, systolic   . Anxiety   . Constipation   . Overactive bladder   . Allergic rhinitis   . Low back pain   . Osteoarthritis   . History of hysterectomy    Past Surgical History  Procedure Laterality Date  . Carpal tunnel release    . Trigger finger release    . Cervical biopsy      cervical lymph node biopsies  . Back surgery      multiple  . Coronary stent placement  04/11/2006    2 -- Taxus stents to the circumflex   . Cardiac catheterization  04/04/2006    Est EF of 60%  . Abdominal hysterectomy    . Tendonitis      bilateral elbow  . Colonoscopy  May 2002    Dr. Irving Shows :Followup in 5 years, normal exam  . Colonoscopy  2008    Dr. Laural Golden: Very redundant colon with mild melanosis coli, splenic flexure polyp biopsy with acute complaint of benign colon polyp. Recommended ten-year followup   Social History   Social History  . Marital Status: Married    Spouse Name: N/A  . Number of Children: 3  . Years of Education: N/A   Social History Main Topics  . Smoking status: Never Smoker   . Smokeless tobacco: Never Used  . Alcohol Use: No  . Drug Use: No  . Sexual Activity: Not Asked   Other Topics Concern  . None   Social History Narrative   Outpatient Encounter Prescriptions as of 12/04/2014  Medication Sig  . ALPRAZolam (XANAX) 0.5 MG tablet Take 0.5 mg by mouth at bedtime as needed.    Marland Kitchen aspirin EC 81 MG tablet Take 81 mg by mouth daily.  . benzonatate (TESSALON) 100 MG capsule Take 100 mg by mouth daily as needed for cough.  . clopidogrel (PLAVIX) 75 MG tablet TAKE 1 TABLET BY MOUTH EVERY DAY  . famotidine (PEPCID) 20 MG tablet Take 20 mg by mouth daily.  . ferrous sulfate 325 (65 FE) MG tablet Take 325 mg  by mouth daily.  . Flax Oil-Fish Oil-Borage Oil (FISH OIL-FLAX OIL-BORAGE OIL) CAPS Take 2 capsules by mouth daily.  Marland Kitchen gabapentin (NEURONTIN) 100 MG capsule Take 100 mg by mouth as needed.  . insulin aspart (NOVOLOG FLEXPEN) 100 UNIT/ML FlexPen Inject 15 Units into the skin 3 (three) times daily with meals.  . Insulin Degludec (TRESIBA FLEXTOUCH) 100 UNIT/ML SOPN Inject 70 Units into the skin at bedtime.  Marland Kitchen latanoprost (XALATAN) 0.005 % ophthalmic solution Place 1 drop into both eyes at bedtime. BOTH EYES 1 DROP AT BEDTIME  . meclizine (ANTIVERT) 12.5 MG tablet Take 12.5 mg by mouth 2 (two) times daily.   . metoprolol (LOPRESSOR) 25 MG tablet TAKE ONE TABLET BY MOUTH TWICE DAILY (Patient taking differently: Take 25 mg by mouth 2 (two) times daily. TAKE ONE TABLET BY MOUTH TWICE DAILY)  . Olmesartan-Amlodipine-HCTZ (TRIBENZOR) 40-10-25 MG TABS Take 1 tablet by mouth daily.  . rosuvastatin (CRESTOR) 20 MG tablet Take 20 mg by mouth at bedtime.   . torsemide (DEMADEX) 20 MG tablet Take 1 tablet (20 mg total) by mouth daily. (Patient taking  differently: Take 20 mg by mouth 2 (two) times daily. )  . trimethoprim (TRIMPEX) 100 MG tablet 1 TAB DAILY   No facility-administered encounter medications on file as of 12/04/2014.   ALLERGIES: Allergies  Allergen Reactions  . Ace Inhibitors Cough    Pt can tolerate Tribenzor (and ARBs)  . Codeine Rash   VACCINATION STATUS:  There is no immunization history on file for this patient.  Diabetes She presents for her follow-up diabetic visit. She has type 2 diabetes mellitus. Her disease course has been improving. Pertinent negatives for hypoglycemia include no confusion, headaches, pallor or seizures. Associated symptoms include polydipsia and polyuria. Pertinent negatives for diabetes include no chest pain, no fatigue and no polyphagia. There are no hypoglycemic complications. Diabetic complications include heart disease, nephropathy and retinopathy.  Pertinent negatives for diabetic complications include no CVA. Risk factors for coronary artery disease include diabetes mellitus, dyslipidemia, sedentary lifestyle and hypertension. Current diabetic treatment includes insulin injections. She is compliant with treatment most of the time. Meal planning includes avoidance of concentrated sweets. She rarely participates in exercise. Her home blood glucose trend is decreasing steadily. Her overall blood glucose range is >200 mg/dl. An ACE inhibitor/angiotensin II receptor blocker is contraindicated (allergic to ACEI). Eye exam is current.  Hypertension This is a chronic problem. The current episode started more than 1 year ago. The problem has been gradually improving since onset. Pertinent negatives include no chest pain, headaches, palpitations or shortness of breath. Hypertensive end-organ damage includes retinopathy. There is no history of CVA.  Hyperlipidemia This is a chronic problem. The current episode started more than 1 year ago. Pertinent negatives include no chest pain, myalgias or shortness of breath. Current antihyperlipidemic treatment includes statins.  Coronary Artery Disease Pertinent negatives include no chest pain, leg swelling, palpitations or shortness of breath. Risk factors include hyperlipidemia and hypertension.     Review of Systems  Constitutional: Negative for fatigue and unexpected weight change.  HENT: Negative for trouble swallowing and voice change.   Eyes: Negative for visual disturbance.  Respiratory: Negative for cough, shortness of breath and wheezing.   Cardiovascular: Negative for chest pain, palpitations and leg swelling.  Gastrointestinal: Negative for nausea, vomiting and diarrhea.  Endocrine: Positive for polydipsia and polyuria. Negative for cold intolerance, heat intolerance and polyphagia.  Musculoskeletal: Negative for myalgias and arthralgias.  Skin: Negative for color change, pallor, rash and wound.   Neurological: Negative for seizures and headaches.  Psychiatric/Behavioral: Negative for suicidal ideas and confusion.    Objective:    Pulse 60  Ht _0  (1.753 m)  Wt 225 lb (102.059 kg)  BMI 33.21 kg/m2  SpO2 99%  Wt Readings from Last 3 Encounters:  12/04/14 225 lb (102.059 kg)  08/29/14 223 lb (101.152 kg)  10/29/14 227 lb 1.6 oz (103.012 kg)    Physical Exam  Constitutional: She is oriented to person, place, and time. She appears well-developed.  HENT:  Head: Normocephalic and atraumatic.  Eyes: EOM are normal.  Neck: Normal range of motion. Neck supple. No tracheal deviation present. No thyromegaly present.  Cardiovascular: Normal rate and regular rhythm.   Pulmonary/Chest: Effort normal and breath sounds normal.  Abdominal: Soft. Bowel sounds are normal. There is no tenderness. There is no guarding.  Musculoskeletal: Normal range of motion. She exhibits no edema.  Neurological: She is alert and oriented to person, place, and time. She has normal reflexes. No cranial nerve deficit. Coordination normal.  Skin: Skin is warm and dry. No rash  noted. No erythema. No pallor.  Psychiatric: She has a normal mood and affect. Judgment normal.    Results for orders placed or performed in visit on 11/21/14  Hemoglobin A1c  Result Value Ref Range   Hgb A1c MFr Bld 10.1 (A) 4.0 - 6.0 %   Complete Blood Count (Most recent): Lab Results  Component Value Date   WBC 5.1 09/23/2013   HGB 12.2 09/23/2013   HCT 36.9 09/23/2013   MCV 99.2 09/23/2013   PLT 226 09/23/2013   Chemistry (most recent): Lab Results  Component Value Date   NA 142 10/08/2013   K 4.5 10/08/2013   CL 106 10/08/2013   CO2 31 10/08/2013   BUN 35* 10/08/2013   CREATININE 1.73* 10/08/2013   Diabetic Labs (most recent): Lab Results  Component Value Date   HGBA1C 10.1* 08/25/2014     Most recent a1c 9.4% , generally improved from 12.3%. Lipid profile (most recent):        Assessment & Plan:    1. Type 2 diabetes mellitus with stage 4 chronic kidney disease  Patient is advised to stick to a routine mealtimes to eat 3 meals  a day and avoid unnecessary snacks ( to snack only to correct hypoglycemia). Patient is advised to eliminate simple carbs  from their diet including cakes desserts ice cream soda (  diet and regular) , sweet tea , Candies,  chips and cookies, artificial sweeteners,   and "sugar-free" products .  This will help patient to have stable blood glucose profile and potentially lose weight. Patient is given detailed personalized glucose monitoring and insulin dosing instructions. Patient is instructed to call back with extremes of blood glucose less than 70 or greater than 300. Patient to bring meter and  blood glucose logs during their next visit.  She will continue Levemir 80 units qhs, Novolog 15 units TIDAC plus correction associated with monitoring of BG ac and hs.  I will obtain the following labs before her next visit. monitoring and  - CMP14+EGFR - Hemoglobin A1c  2. Essential hypertension Pt is allergic to ACEI, controleld. Advised to continue the rest of her medications.  3. Hyperlipemia -continue Crestor.   Follow up plan: Return in about 3 months (around 03/05/2015) for diabetes, high blood pressure, high cholesterol, follow up with pre-visit labs, meter, and logs.  Glade Lloyd, MD Phone: [phone]  Fax: [phone]   12/04/2014, 1:12 PM

## 2014-12-05 ENCOUNTER — Encounter (HOSPITAL_COMMUNITY): Payer: BLUE CROSS/BLUE SHIELD

## 2014-12-07 ENCOUNTER — Other Ambulatory Visit: Payer: Self-pay | Admitting: Cardiovascular Disease

## 2014-12-08 ENCOUNTER — Encounter (HOSPITAL_COMMUNITY)
Admission: RE | Admit: 2014-12-08 | Discharge: 2014-12-08 | Disposition: A | Discharge status: Home / Self Care | Payer: BLUE CROSS/BLUE SHIELD | Source: Ambulatory Visit | Attending: Cardiovascular Disease | Admitting: Cardiovascular Disease

## 2014-12-08 DIAGNOSIS — I25119 Atherosclerotic heart disease of native coronary artery with unspecified angina pectoris: Secondary | ICD-10-CM | POA: Diagnosis not present

## 2014-12-09 ENCOUNTER — Other Ambulatory Visit: Payer: BLUE CROSS/BLUE SHIELD

## 2014-12-10 ENCOUNTER — Encounter (HOSPITAL_COMMUNITY)
Admission: RE | Admit: 2014-12-10 | Discharge: 2014-12-10 | Disposition: A | Discharge status: Home / Self Care | Payer: BLUE CROSS/BLUE SHIELD | Source: Ambulatory Visit | Attending: Cardiovascular Disease | Admitting: Cardiovascular Disease

## 2014-12-10 DIAGNOSIS — I25119 Atherosclerotic heart disease of native coronary artery with unspecified angina pectoris: Secondary | ICD-10-CM | POA: Diagnosis not present

## 2014-12-11 DIAGNOSIS — E1142 Type 2 diabetes mellitus with diabetic polyneuropathy: Secondary | ICD-10-CM | POA: Diagnosis not present

## 2014-12-11 DIAGNOSIS — B351 Tinea unguium: Secondary | ICD-10-CM | POA: Diagnosis not present

## 2014-12-12 ENCOUNTER — Ambulatory Visit
Admission: RE | Admit: 2014-12-12 | Discharge: 2014-12-12 | Disposition: A | Discharge status: Home / Self Care | Payer: Medicare Other | Source: Ambulatory Visit | Attending: Nephrology | Admitting: Nephrology

## 2014-12-12 ENCOUNTER — Encounter (HOSPITAL_COMMUNITY): Payer: BLUE CROSS/BLUE SHIELD

## 2014-12-12 DIAGNOSIS — N184 Chronic kidney disease, stage 4 (severe): Secondary | ICD-10-CM

## 2014-12-12 DIAGNOSIS — N189 Chronic kidney disease, unspecified: Secondary | ICD-10-CM | POA: Diagnosis not present

## 2014-12-15 ENCOUNTER — Encounter (HOSPITAL_COMMUNITY): Payer: BLUE CROSS/BLUE SHIELD

## 2014-12-15 DIAGNOSIS — N3946 Mixed incontinence: Secondary | ICD-10-CM | POA: Diagnosis not present

## 2014-12-17 ENCOUNTER — Encounter (HOSPITAL_COMMUNITY): Payer: BLUE CROSS/BLUE SHIELD

## 2014-12-17 DIAGNOSIS — I129 Hypertensive chronic kidney disease with stage 1 through stage 4 chronic kidney disease, or unspecified chronic kidney disease: Secondary | ICD-10-CM | POA: Diagnosis not present

## 2014-12-17 DIAGNOSIS — E1140 Type 2 diabetes mellitus with diabetic neuropathy, unspecified: Secondary | ICD-10-CM | POA: Diagnosis not present

## 2014-12-17 DIAGNOSIS — I2510 Atherosclerotic heart disease of native coronary artery without angina pectoris: Secondary | ICD-10-CM | POA: Diagnosis not present

## 2014-12-17 DIAGNOSIS — E119 Type 2 diabetes mellitus without complications: Secondary | ICD-10-CM | POA: Diagnosis not present

## 2014-12-17 DIAGNOSIS — E1122 Type 2 diabetes mellitus with diabetic chronic kidney disease: Secondary | ICD-10-CM | POA: Diagnosis not present

## 2014-12-17 DIAGNOSIS — E785 Hyperlipidemia, unspecified: Secondary | ICD-10-CM | POA: Diagnosis not present

## 2014-12-17 DIAGNOSIS — N184 Chronic kidney disease, stage 4 (severe): Secondary | ICD-10-CM | POA: Diagnosis not present

## 2014-12-17 DIAGNOSIS — D631 Anemia in chronic kidney disease: Secondary | ICD-10-CM | POA: Diagnosis not present

## 2014-12-17 DIAGNOSIS — Z794 Long term (current) use of insulin: Secondary | ICD-10-CM | POA: Diagnosis not present

## 2014-12-17 DIAGNOSIS — N3281 Overactive bladder: Secondary | ICD-10-CM | POA: Diagnosis not present

## 2014-12-19 ENCOUNTER — Encounter (HOSPITAL_COMMUNITY): Payer: BLUE CROSS/BLUE SHIELD

## 2014-12-22 ENCOUNTER — Encounter (HOSPITAL_COMMUNITY): Payer: BLUE CROSS/BLUE SHIELD

## 2014-12-22 ENCOUNTER — Encounter (HOSPITAL_COMMUNITY)
Admission: RE | Admit: 2014-12-22 | Discharge: 2014-12-22 | Disposition: A | Discharge status: Home / Self Care | Payer: BLUE CROSS/BLUE SHIELD | Source: Ambulatory Visit | Attending: Cardiovascular Disease | Admitting: Cardiovascular Disease

## 2014-12-22 DIAGNOSIS — I25119 Atherosclerotic heart disease of native coronary artery with unspecified angina pectoris: Secondary | ICD-10-CM | POA: Diagnosis not present

## 2014-12-24 ENCOUNTER — Encounter (HOSPITAL_COMMUNITY): Payer: BLUE CROSS/BLUE SHIELD

## 2014-12-26 ENCOUNTER — Encounter (HOSPITAL_COMMUNITY)
Admission: RE | Admit: 2014-12-26 | Discharge: 2014-12-26 | Disposition: A | Discharge status: Home / Self Care | Payer: BLUE CROSS/BLUE SHIELD | Source: Ambulatory Visit | Attending: Cardiovascular Disease | Admitting: Cardiovascular Disease

## 2014-12-26 DIAGNOSIS — I25119 Atherosclerotic heart disease of native coronary artery with unspecified angina pectoris: Secondary | ICD-10-CM | POA: Diagnosis not present

## 2014-12-29 ENCOUNTER — Encounter (HOSPITAL_COMMUNITY)
Admission: RE | Admit: 2014-12-29 | Discharge: 2014-12-29 | Disposition: A | Discharge status: Home / Self Care | Payer: BLUE CROSS/BLUE SHIELD | Source: Ambulatory Visit | Attending: Cardiovascular Disease | Admitting: Cardiovascular Disease

## 2014-12-29 DIAGNOSIS — I25119 Atherosclerotic heart disease of native coronary artery with unspecified angina pectoris: Secondary | ICD-10-CM | POA: Diagnosis not present

## 2014-12-31 ENCOUNTER — Encounter (HOSPITAL_COMMUNITY): Payer: BLUE CROSS/BLUE SHIELD

## 2015-01-02 ENCOUNTER — Encounter (HOSPITAL_COMMUNITY): Payer: BLUE CROSS/BLUE SHIELD

## 2015-01-05 ENCOUNTER — Encounter (HOSPITAL_COMMUNITY)
Admission: RE | Admit: 2015-01-05 | Discharge: 2015-01-05 | Disposition: A | Discharge status: Home / Self Care | Payer: BLUE CROSS/BLUE SHIELD | Source: Ambulatory Visit | Attending: Cardiovascular Disease | Admitting: Cardiovascular Disease

## 2015-01-05 DIAGNOSIS — I25119 Atherosclerotic heart disease of native coronary artery with unspecified angina pectoris: Secondary | ICD-10-CM | POA: Diagnosis not present

## 2015-01-07 ENCOUNTER — Encounter (HOSPITAL_COMMUNITY)
Admission: RE | Admit: 2015-01-07 | Discharge: 2015-01-07 | Disposition: A | Discharge status: Home / Self Care | Payer: BLUE CROSS/BLUE SHIELD | Source: Ambulatory Visit | Attending: Cardiovascular Disease | Admitting: Cardiovascular Disease

## 2015-01-07 DIAGNOSIS — I25119 Atherosclerotic heart disease of native coronary artery with unspecified angina pectoris: Secondary | ICD-10-CM | POA: Diagnosis not present

## 2015-01-07 NOTE — Progress Notes (Signed)
Patient was given individual home exercise plan. Handout was reviewed and discussed. Patient verbalized an understanding. 

## 2015-01-09 ENCOUNTER — Encounter (HOSPITAL_COMMUNITY): Payer: BLUE CROSS/BLUE SHIELD

## 2015-01-10 DIAGNOSIS — J019 Acute sinusitis, unspecified: Secondary | ICD-10-CM | POA: Diagnosis not present

## 2015-01-12 ENCOUNTER — Encounter (HOSPITAL_COMMUNITY): Payer: BLUE CROSS/BLUE SHIELD

## 2015-01-14 ENCOUNTER — Encounter (HOSPITAL_COMMUNITY)
Admission: RE | Admit: 2015-01-14 | Discharge: 2015-01-14 | Disposition: A | Discharge status: Home / Self Care | Payer: BLUE CROSS/BLUE SHIELD | Source: Ambulatory Visit | Attending: Cardiovascular Disease | Admitting: Cardiovascular Disease

## 2015-01-14 DIAGNOSIS — I25119 Atherosclerotic heart disease of native coronary artery with unspecified angina pectoris: Secondary | ICD-10-CM | POA: Diagnosis not present

## 2015-01-16 ENCOUNTER — Encounter (HOSPITAL_COMMUNITY): Payer: BLUE CROSS/BLUE SHIELD

## 2015-01-19 ENCOUNTER — Encounter (HOSPITAL_COMMUNITY): Payer: BLUE CROSS/BLUE SHIELD

## 2015-01-19 DIAGNOSIS — N3946 Mixed incontinence: Secondary | ICD-10-CM | POA: Diagnosis not present

## 2015-01-19 DIAGNOSIS — R350 Frequency of micturition: Secondary | ICD-10-CM | POA: Diagnosis not present

## 2015-01-20 DIAGNOSIS — S90522A Blister (nonthermal), left ankle, initial encounter: Secondary | ICD-10-CM | POA: Diagnosis not present

## 2015-01-21 ENCOUNTER — Encounter (HOSPITAL_COMMUNITY): Payer: BLUE CROSS/BLUE SHIELD

## 2015-01-23 ENCOUNTER — Encounter (HOSPITAL_COMMUNITY): Payer: BLUE CROSS/BLUE SHIELD

## 2015-02-04 DIAGNOSIS — N3946 Mixed incontinence: Secondary | ICD-10-CM | POA: Diagnosis not present

## 2015-02-04 DIAGNOSIS — R350 Frequency of micturition: Secondary | ICD-10-CM | POA: Diagnosis not present

## 2015-02-06 DIAGNOSIS — N184 Chronic kidney disease, stage 4 (severe): Secondary | ICD-10-CM | POA: Diagnosis not present

## 2015-02-06 DIAGNOSIS — I129 Hypertensive chronic kidney disease with stage 1 through stage 4 chronic kidney disease, or unspecified chronic kidney disease: Secondary | ICD-10-CM | POA: Diagnosis not present

## 2015-02-06 DIAGNOSIS — Z794 Long term (current) use of insulin: Secondary | ICD-10-CM | POA: Diagnosis not present

## 2015-02-06 DIAGNOSIS — D631 Anemia in chronic kidney disease: Secondary | ICD-10-CM | POA: Diagnosis not present

## 2015-02-06 DIAGNOSIS — N189 Chronic kidney disease, unspecified: Secondary | ICD-10-CM | POA: Diagnosis not present

## 2015-02-06 DIAGNOSIS — E1122 Type 2 diabetes mellitus with diabetic chronic kidney disease: Secondary | ICD-10-CM | POA: Diagnosis not present

## 2015-02-06 DIAGNOSIS — N3281 Overactive bladder: Secondary | ICD-10-CM | POA: Diagnosis not present

## 2015-02-06 DIAGNOSIS — E119 Type 2 diabetes mellitus without complications: Secondary | ICD-10-CM | POA: Diagnosis not present

## 2015-02-06 DIAGNOSIS — E785 Hyperlipidemia, unspecified: Secondary | ICD-10-CM | POA: Diagnosis not present

## 2015-02-06 NOTE — Addendum Note (Signed)
Encounter addended by: Cathie Olden, RN on: 02/06/2015  2:36 PM<BR>     Documentation filed: Clinical Notes, Notes Section

## 2015-02-06 NOTE — Progress Notes (Addendum)
02/04/15- Patient is discharged from [facility] program today, 02/04/15 with 15 sessions.  Patient discharged due to health and noncompliance.  Patients to last session attended was 01/14/15. Patient was sent a discharge letter.

## 2015-02-19 DIAGNOSIS — R350 Frequency of micturition: Secondary | ICD-10-CM | POA: Diagnosis not present

## 2015-02-25 DIAGNOSIS — B351 Tinea unguium: Secondary | ICD-10-CM | POA: Diagnosis not present

## 2015-02-25 DIAGNOSIS — E1142 Type 2 diabetes mellitus with diabetic polyneuropathy: Secondary | ICD-10-CM | POA: Diagnosis not present

## 2015-03-03 DIAGNOSIS — T148 Other injury of unspecified body region: Secondary | ICD-10-CM | POA: Diagnosis not present

## 2015-03-06 ENCOUNTER — Ambulatory Visit: Payer: Medicare Other | Admitting: "Endocrinology

## 2015-03-10 DIAGNOSIS — N184 Chronic kidney disease, stage 4 (severe): Secondary | ICD-10-CM | POA: Diagnosis not present

## 2015-03-10 DIAGNOSIS — E1122 Type 2 diabetes mellitus with diabetic chronic kidney disease: Secondary | ICD-10-CM | POA: Diagnosis not present

## 2015-03-11 LAB — CMP14+EGFR
ALK PHOS: 65 IU/L (ref 39–117)
ALT: 22 IU/L (ref 0–32)
AST: 23 IU/L (ref 0–40)
Albumin/Globulin Ratio: 1.4 (ref 1.1–2.5)
Albumin: 4.2 g/dL (ref 3.5–4.8)
BILIRUBIN TOTAL: 0.3 mg/dL (ref 0.0–1.2)
BUN/Creatinine Ratio: 31 — ABNORMAL HIGH (ref 11–26)
BUN: 90 mg/dL — AB (ref 8–27)
CHLORIDE: 95 mmol/L — AB (ref 96–106)
CO2: 26 mmol/L (ref 18–29)
CREATININE: 2.86 mg/dL — AB (ref 0.57–1.00)
Calcium: 9.6 mg/dL (ref 8.7–10.3)
GFR calc Af Amer: 19 mL/min/{1.73_m2} — ABNORMAL LOW (ref >59)
GFR calc non Af Amer: 16 mL/min/{1.73_m2} — ABNORMAL LOW (ref >59)
GLUCOSE: 71 mg/dL (ref 65–99)
Globulin, Total: 3.1 g/dL (ref 1.5–4.5)
Potassium: 4 mmol/L (ref 3.5–5.2)
Sodium: 140 mmol/L (ref 134–144)
Total Protein: 7.3 g/dL (ref 6.0–8.5)

## 2015-03-17 ENCOUNTER — Ambulatory Visit: Payer: Medicare Other | Admitting: "Endocrinology

## 2015-03-17 DIAGNOSIS — L97921 Non-pressure chronic ulcer of unspecified part of left lower leg limited to breakdown of skin: Secondary | ICD-10-CM | POA: Diagnosis not present

## 2015-03-21 DIAGNOSIS — K5900 Constipation, unspecified: Secondary | ICD-10-CM | POA: Diagnosis not present

## 2015-03-21 DIAGNOSIS — E119 Type 2 diabetes mellitus without complications: Secondary | ICD-10-CM | POA: Diagnosis not present

## 2015-03-21 DIAGNOSIS — R109 Unspecified abdominal pain: Secondary | ICD-10-CM | POA: Diagnosis not present

## 2015-03-21 DIAGNOSIS — Z79899 Other long term (current) drug therapy: Secondary | ICD-10-CM | POA: Diagnosis not present

## 2015-03-21 DIAGNOSIS — I10 Essential (primary) hypertension: Secondary | ICD-10-CM | POA: Diagnosis not present

## 2015-03-21 DIAGNOSIS — Z7982 Long term (current) use of aspirin: Secondary | ICD-10-CM | POA: Diagnosis not present

## 2015-03-24 ENCOUNTER — Other Ambulatory Visit: Payer: Self-pay | Admitting: "Endocrinology

## 2015-03-27 ENCOUNTER — Encounter: Payer: Self-pay | Admitting: *Deleted

## 2015-03-27 ENCOUNTER — Encounter: Payer: Self-pay | Admitting: Cardiovascular Disease

## 2015-03-27 ENCOUNTER — Ambulatory Visit (INDEPENDENT_AMBULATORY_CARE_PROVIDER_SITE_OTHER): Payer: BLUE CROSS/BLUE SHIELD | Admitting: Cardiovascular Disease

## 2015-03-27 VITALS — BP 143/61 | HR 62 | Ht 70.0 in | Wt 224.0 lb

## 2015-03-27 DIAGNOSIS — I10 Essential (primary) hypertension: Secondary | ICD-10-CM

## 2015-03-27 DIAGNOSIS — E785 Hyperlipidemia, unspecified: Secondary | ICD-10-CM | POA: Diagnosis not present

## 2015-03-27 DIAGNOSIS — L97921 Non-pressure chronic ulcer of unspecified part of left lower leg limited to breakdown of skin: Secondary | ICD-10-CM | POA: Diagnosis not present

## 2015-03-27 DIAGNOSIS — I2510 Atherosclerotic heart disease of native coronary artery without angina pectoris: Secondary | ICD-10-CM | POA: Diagnosis not present

## 2015-03-27 NOTE — Progress Notes (Signed)
Patient ID: [name], female   DOB: 1946-02-07, 70 y.o.   MRN: [id number]      SUBJECTIVE: The patient is a 70 year old woman who presents for routine follow up. She has a history of coronary artery disease and underwent percutaneous coronary intervention of the mid circumflex with 2 overlapping Taxus drug-eluting stents by Dr. Eustace Quail in 2008. She had diffuse disease in the left anterior descending with 40% proximal, 70% mid and 80% distal stenosis and 80% stenosis in the first diagonal branch by cath in 03/2006. Left ventriculography at the time demonstrated normal left ventricular systolic function.  Echo in 01/2012 demonstrated vigorous left ventricular systolic function, EF [id number].  She also has hypertension, hyperlipidemia, and insulin-dependent diabetes mellitus.  She did not have chest pain or shortness of breath even prior to her stents.   She had previously been complaining of fatigue at aprior visit in the summer of 2015, and I scheduled her for coronary angiography. Creatinine was elevated at 2.2 and cath was cancelled. Torsemide was subsequently reduced and creatinine was 1.7. However, cath was never rescheduled.  She is doing well today and denies chest tightness and shortness of breath. Her primary complaint relates to lower back pain.    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   Review of Systems: As per "subjective", otherwise negative.  Allergies  Allergen Reactions  . Ace Inhibitors Cough    Pt can tolerate Tribenzor (and ARBs)  . Codeine Rash    Current Outpatient Prescriptions  Medication Sig Dispense Refill  . ALPRAZolam (XANAX) 0.5 MG tablet Take 0.5 mg by mouth at bedtime as needed.      Marland Kitchen aspirin EC 81 MG tablet Take 81 mg by mouth daily.    . benzonatate (TESSALON) 100 MG capsule Take 100 mg by mouth daily as needed for cough.    . calcitRIOL (ROCALTROL) 0.25 MCG capsule Take 1 capsule by mouth 3 (three) times a week.     . clopidogrel (PLAVIX) 75 MG tablet TAKE 1 TABLET BY MOUTH EVERY DAY 30 tablet 6  . famotidine (PEPCID) 20 MG tablet Take 20 mg by mouth daily.    . ferrous sulfate 325 (65 FE) MG tablet Take 325 mg by mouth daily.    . Flax Oil-Fish Oil-Borage Oil (FISH OIL-FLAX OIL-BORAGE OIL) CAPS Take 2 capsules by mouth daily.    Marland Kitchen gabapentin (NEURONTIN) 100 MG capsule Take 100 mg by mouth as needed.    . Insulin Degludec (TRESIBA FLEXTOUCH) 100 UNIT/ML SOPN Inject 70 Units into the skin at bedtime.    Marland Kitchen latanoprost (XALATAN) 0.005 % ophthalmic solution Place 1 drop into both eyes at bedtime. BOTH EYES 1 DROP AT BEDTIME    . meclizine (ANTIVERT) 12.5 MG tablet Take 12.5 mg by mouth 2 (two) times daily as needed.     . metoprolol tartrate (LOPRESSOR) 25 MG tablet TAKE ONE TABLET BY MOUTH TWICE DAILY 60 tablet 6  . NOVOLOG FLEXPEN 100 UNIT/ML FlexPen INJECT 15 UNITS INTO THE SKIN THREE TIMES DAILY WITH MEALS 15 mL 2  . Olmesartan-Amlodipine-HCTZ (TRIBENZOR) 40-10-25 MG TABS Take 1 tablet by mouth daily. 30 tablet   . rosuvastatin (CRESTOR) 20 MG tablet Take 20 mg by mouth at bedtime.     . torsemide (DEMADEX) 20 MG tablet Take 1 tablet by mouth 2 (two) times daily.    Marland Kitchen trimethoprim (TRIMPEX) 100 MG tablet 1 TAB DAILY     No current facility-administered medications for this  visit.    Past Medical History  Diagnosis Date  . Hypertension   . Hyperlipidemia   . Diabetes mellitus   . Obesity   . GERD (gastroesophageal reflux disease)   . Heart murmur, systolic   . Anxiety   . Constipation   . Overactive bladder   . Allergic rhinitis   . Low back pain   . Osteoarthritis   . History of hysterectomy     Past Surgical History  Procedure Laterality Date  . Carpal tunnel release    . Trigger finger release    . Cervical biopsy      cervical lymph node biopsies  . Back surgery      multiple  . Coronary stent placement  04/11/2006    2 -- Taxus stents to the circumflex   . Cardiac  catheterization  04/04/2006    Est EF of 60%  . Abdominal hysterectomy    . Tendonitis      bilateral elbow  . Colonoscopy  May 2002    Dr. Irving Shows :Followup in 5 years, normal exam  . Colonoscopy  2008    Dr. Laural Golden: Very redundant colon with mild melanosis coli, splenic flexure polyp biopsy with acute complaint of benign colon polyp. Recommended ten-year followup    Social History   Social History  . Marital Status: Married    Spouse Name: N/A  . Number of Children: 3  . Years of Education: N/A   Occupational History  . Not on file.   Social History Main Topics  . Smoking status: Never Smoker   . Smokeless tobacco: Never Used  . Alcohol Use: No  . Drug Use: No  . Sexual Activity: Not on file   Other Topics Concern  . Not on file   Social History Narrative     Filed Vitals:   03/27/15 1039  BP: 143/61  Pulse: 62  Height: 5\' 10"  (1.778 m)  Weight: 224 lb (101.606 kg)    PHYSICAL EXAM General: NAD HEENT: Normal. Neck: No JVD, no thyromegaly. Lungs: Clear to auscultation bilaterally with normal respiratory effort. CV: Nondisplaced PMI. Regular rate and rhythm, normal S1/S2, no S3/S4, no murmur. Trace pretibial and periankle edema.Left leg bandaged.   Abdomen: Soft, obese.  Neurologic: Alert and oriented x 3.  Psych: Normal affect. Skin: Normal. Musculoskeletal: No gross deformities. Extremities: No clubbing or cyanosis.   ECG: Most recent ECG reviewed.      ASSESSMENT AND PLAN: 1. CAD: Symptomatically stable. She had severe LAD disease in 2008 and had a 40% mid RCA lesion at that time as well. I will continue ASA 81 mg daily and Plavix along with metoprolol and Crestor.  2. Essential HTN: Mildly elevated. She reportedly has white coat hypertension. BP at home 123456 systolic range. No changes to meds today.  3. Hyperlipidemia: On Crestor 20 mg daily. Will obtain copy of lipids.  Dispo: f/u 6 months.   Kate Sable, M.D., F.A.C.C.

## 2015-03-27 NOTE — Patient Instructions (Signed)
Your physician wants you to follow-up in: [place] DR. Shawna Orleans You will receive a reminder letter in the mail two months in advance. If you don't receive a letter, please call our office to schedule the follow-up appointment.  Your physician recommends that you continue on your current medications as directed. Please refer to the Current Medication list given to you today.  Thank you for choosing [facility]!!

## 2015-03-30 ENCOUNTER — Encounter: Payer: Self-pay | Admitting: "Endocrinology

## 2015-03-30 ENCOUNTER — Ambulatory Visit (INDEPENDENT_AMBULATORY_CARE_PROVIDER_SITE_OTHER): Payer: BLUE CROSS/BLUE SHIELD | Admitting: "Endocrinology

## 2015-03-30 VITALS — BP 121/66 | HR 67 | Ht 70.0 in | Wt 225.0 lb

## 2015-03-30 DIAGNOSIS — E1122 Type 2 diabetes mellitus with diabetic chronic kidney disease: Secondary | ICD-10-CM | POA: Diagnosis not present

## 2015-03-30 DIAGNOSIS — E785 Hyperlipidemia, unspecified: Secondary | ICD-10-CM

## 2015-03-30 DIAGNOSIS — I2510 Atherosclerotic heart disease of native coronary artery without angina pectoris: Secondary | ICD-10-CM | POA: Diagnosis not present

## 2015-03-30 DIAGNOSIS — I10 Essential (primary) hypertension: Secondary | ICD-10-CM | POA: Diagnosis not present

## 2015-03-30 DIAGNOSIS — N184 Chronic kidney disease, stage 4 (severe): Secondary | ICD-10-CM

## 2015-03-30 DIAGNOSIS — Z794 Long term (current) use of insulin: Secondary | ICD-10-CM | POA: Diagnosis not present

## 2015-03-30 MED ORDER — INSULIN ASPART 100 UNIT/ML FLEXPEN: 15.0000 [IU] | PEN_INJECTOR | Freq: Three times a day (TID) | SUBCUTANEOUS | Status: DC

## 2015-03-30 NOTE — Progress Notes (Signed)
Subjective:    Patient ID: [name], female    DOB: 03/02/1946,    Past Medical History  Diagnosis Date  . Hypertension   . Hyperlipidemia   . Diabetes mellitus   . Obesity   . GERD (gastroesophageal reflux disease)   . Heart murmur, systolic   . Anxiety   . Constipation   . Overactive bladder   . Allergic rhinitis   . Low back pain   . Osteoarthritis   . History of hysterectomy    Past Surgical History  Procedure Laterality Date  . Carpal tunnel release    . Trigger finger release    . Cervical biopsy      cervical lymph node biopsies  . Back surgery      multiple  . Coronary stent placement  04/11/2006    2 -- Taxus stents to the circumflex   . Cardiac catheterization  04/04/2006    Est EF of 60%  . Abdominal hysterectomy    . Tendonitis      bilateral elbow  . Colonoscopy  May 2002    Dr. Irving Shows :Followup in 5 years, normal exam  . Colonoscopy  2008    Dr. Laural Golden: Very redundant colon with mild melanosis coli, splenic flexure polyp biopsy with acute complaint of benign colon polyp. Recommended ten-year followup   Social History   Social History  . Marital Status: Married    Spouse Name: N/A  . Number of Children: 3  . Years of Education: N/A   Social History Main Topics  . Smoking status: Never Smoker   . Smokeless tobacco: Never Used  . Alcohol Use: No  . Drug Use: No  . Sexual Activity: Not Asked   Other Topics Concern  . None   Social History Narrative   Outpatient Encounter Prescriptions as of 03/30/2015  Medication Sig  . ALPRAZolam (XANAX) 0.5 MG tablet Take 0.5 mg by mouth at bedtime as needed.    Marland Kitchen aspirin EC 81 MG tablet Take 81 mg by mouth daily.  . benzonatate (TESSALON) 100 MG capsule Take 100 mg by mouth daily as needed for cough.  . calcitRIOL (ROCALTROL) 0.25 MCG capsule Take 1 capsule by mouth 3 (three) times a week.  . clopidogrel (PLAVIX) 75 MG tablet TAKE 1 TABLET BY MOUTH EVERY DAY  . famotidine (PEPCID) 20  MG tablet Take 20 mg by mouth daily.  . ferrous sulfate 325 (65 FE) MG tablet Take 325 mg by mouth daily.  . Flax Oil-Fish Oil-Borage Oil (FISH OIL-FLAX OIL-BORAGE OIL) CAPS Take 2 capsules by mouth daily.  Marland Kitchen gabapentin (NEURONTIN) 100 MG capsule Take 100 mg by mouth as needed.  . insulin aspart (NOVOLOG FLEXPEN) 100 UNIT/ML FlexPen Inject 15-21 Units into the skin 3 (three) times daily with meals.  . Insulin Degludec (TRESIBA FLEXTOUCH) 100 UNIT/ML SOPN Inject 70 Units into the skin at bedtime.  Marland Kitchen latanoprost (XALATAN) 0.005 % ophthalmic solution Place 1 drop into both eyes at bedtime. BOTH EYES 1 DROP AT BEDTIME  . meclizine (ANTIVERT) 12.5 MG tablet Take 12.5 mg by mouth 2 (two) times daily as needed.   . metoprolol tartrate (LOPRESSOR) 25 MG tablet TAKE ONE TABLET BY MOUTH TWICE DAILY  . Olmesartan-Amlodipine-HCTZ (TRIBENZOR) 40-10-25 MG TABS Take 1 tablet by mouth daily.  . rosuvastatin (CRESTOR) 20 MG tablet Take 20 mg by mouth at bedtime.   . torsemide (DEMADEX) 20 MG tablet Take 1 tablet by mouth 2 (two) times daily.  Marland Kitchen  trimethoprim (TRIMPEX) 100 MG tablet 1 TAB DAILY  . [DISCONTINUED] NOVOLOG FLEXPEN 100 UNIT/ML FlexPen INJECT 15 UNITS INTO THE SKIN THREE TIMES DAILY WITH MEALS   No facility-administered encounter medications on file as of 03/30/2015.   ALLERGIES: Allergies  Allergen Reactions  . Ace Inhibitors Cough    Pt can tolerate Tribenzor (and ARBs)  . Codeine Rash   VACCINATION STATUS:  There is no immunization history on file for this patient.  Diabetes She presents for her follow-up diabetic visit. She has type 2 diabetes mellitus. Her disease course has been improving. Pertinent negatives for hypoglycemia include no confusion, headaches, pallor or seizures. Associated symptoms include polydipsia and polyuria. Pertinent negatives for diabetes include no chest pain, no fatigue and no polyphagia. There are no hypoglycemic complications. Symptoms are improving. Diabetic  complications include heart disease, nephropathy and retinopathy. Pertinent negatives for diabetic complications include no CVA. Risk factors for coronary artery disease include diabetes mellitus, dyslipidemia, sedentary lifestyle and hypertension. Current diabetic treatment includes insulin injections. She is compliant with treatment most of the time. She is following a generally unhealthy diet. Meal planning includes avoidance of concentrated sweets. She rarely participates in exercise. Her home blood glucose trend is decreasing steadily. Her breakfast blood glucose range is generally 110-130 mg/dl. Her lunch blood glucose range is generally 180-200 mg/dl. Her dinner blood glucose range is generally 180-200 mg/dl. Her overall blood glucose range is 180-200 mg/dl. An ACE inhibitor/angiotensin II receptor blocker is contraindicated (allergic to ACEI). Eye exam is current.  Hypertension This is a chronic problem. The current episode started more than 1 year ago. The problem has been gradually improving since onset. Pertinent negatives include no chest pain, headaches, palpitations or shortness of breath. Hypertensive end-organ damage includes retinopathy. There is no history of CVA.  Hyperlipidemia This is a chronic problem. The current episode started more than 1 year ago. Pertinent negatives include no chest pain, myalgias or shortness of breath. Current antihyperlipidemic treatment includes statins.  Coronary Artery Disease Pertinent negatives include no chest pain, leg swelling, palpitations or shortness of breath. Risk factors include hyperlipidemia and hypertension.     Review of Systems  Constitutional: Negative for fatigue and unexpected weight change.  HENT: Negative for trouble swallowing and voice change.   Eyes: Negative for visual disturbance.  Respiratory: Negative for cough, shortness of breath and wheezing.   Cardiovascular: Negative for chest pain, palpitations and leg swelling.   Gastrointestinal: Negative for nausea, vomiting and diarrhea.  Endocrine: Positive for polydipsia and polyuria. Negative for cold intolerance, heat intolerance and polyphagia.  Musculoskeletal: Negative for myalgias and arthralgias.  Skin: Negative for color change, pallor, rash and wound.  Neurological: Negative for seizures and headaches.  Psychiatric/Behavioral: Negative for suicidal ideas and confusion.    Objective:    BP 121/66 mmHg  Pulse 67  Ht '5\' 10"'$  (1.778 m)  Wt 225 lb (102.059 kg)  BMI 32.28 kg/m2  SpO2 99%  Wt Readings from Last 3 Encounters:  03/30/15 225 lb (102.059 kg)  03/27/15 224 lb (101.606 kg)  12/04/14 225 lb (102.059 kg)    Physical Exam  Constitutional: She is oriented to person, place, and time. She appears well-developed.  HENT:  Head: Normocephalic and atraumatic.  Eyes: EOM are normal.  Neck: Normal range of motion. Neck supple. No tracheal deviation present. No thyromegaly present.  Cardiovascular: Normal rate and regular rhythm.   Pulmonary/Chest: Effort normal and breath sounds normal.  Abdominal: Soft. Bowel sounds are normal. There is no tenderness.  There is no guarding.  Musculoskeletal: Normal range of motion. She exhibits no edema.  Neurological: She is alert and oriented to person, place, and time. She has normal reflexes. No cranial nerve deficit. Coordination normal.  Skin: Skin is warm and dry. No rash noted. No erythema. No pallor.  Psychiatric: She has a normal mood and affect. Judgment normal.    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 Lipid profile (most recent): -Her most recent glucose profile is consistent with improvement.  Assessment & Plan:   1. Type 2 diabetes mellitus with stage 4 chronic kidney disease  -She came with improved blood glucose profile, her A1c was not repeated this time. Patient is advised to stick to a routine mealtimes to eat 3 meals  a day and avoid unnecessary snacks ( to snack only to correct hypoglycemia). Patient is advised to eliminate simple carbs  from their diet including cakes desserts ice cream soda (  diet and regular) , sweet tea , Candies,  chips and cookies, artificial sweeteners,   and "sugar-free" products .  This will help patient to have stable blood glucose profile and potentially lose weight. Patient is given detailed personalized glucose monitoring and insulin dosing instructions. Patient is instructed to call back with extremes of blood glucose less than 70 or greater than 300. Patient to bring meter and  blood glucose logs during their next visit.  - I will lower her  Tyler Aas to 70  units qhs,  continue Novolog 15 units TIDAC plus correction associated with monitoring of BG ac and hs. -Patient is warned not to take insulin without proper monitoring. -She is encouraged to call clinic if she registers blood glucose below 70 or above 300 mg/dL 3 tests in- a- row . -She does not have a safe oral treatment option for diabetes given advanced chronic kidney disease.  -Patient specific target for A1c, LDL, and triglycerides were discussed with patient.  I will obtain the following labs before her next visit. monitoring and  - CMP14+EGFR - Hemoglobin A1c  2. Essential hypertension Pt is allergic to ACEI, controleld. Advised to continue the rest of her medications.  3. Hyperlipemia -continue Crestor.   Follow up plan: Return in about 3 months (around 06/28/2015) for diabetes, high blood pressure, high  cholesterol, follow up with pre-visit labs, meter, and logs.  Glade Lloyd, MD Phone: [phone]  Fax: [phone]   03/30/2015, 10:45 AM

## 2015-03-30 NOTE — Patient Instructions (Signed)

## 2015-04-07 DIAGNOSIS — K5900 Constipation, unspecified: Secondary | ICD-10-CM | POA: Diagnosis not present

## 2015-04-07 DIAGNOSIS — E119 Type 2 diabetes mellitus without complications: Secondary | ICD-10-CM | POA: Diagnosis not present

## 2015-04-10 DIAGNOSIS — R234 Changes in skin texture: Secondary | ICD-10-CM | POA: Diagnosis not present

## 2015-04-18 NOTE — Progress Notes (Signed)
REVIEWED-NO ADDITIONAL RECOMMENDATIONS. 

## 2015-04-28 DIAGNOSIS — N3020 Other chronic cystitis without hematuria: Secondary | ICD-10-CM | POA: Diagnosis not present

## 2015-05-05 DIAGNOSIS — Z1231 Encounter for screening mammogram for malignant neoplasm of breast: Secondary | ICD-10-CM | POA: Diagnosis not present

## 2015-05-06 DIAGNOSIS — N3020 Other chronic cystitis without hematuria: Secondary | ICD-10-CM | POA: Diagnosis not present

## 2015-05-06 DIAGNOSIS — Z794 Long term (current) use of insulin: Secondary | ICD-10-CM | POA: Diagnosis not present

## 2015-05-06 DIAGNOSIS — D631 Anemia in chronic kidney disease: Secondary | ICD-10-CM | POA: Diagnosis not present

## 2015-05-06 DIAGNOSIS — E119 Type 2 diabetes mellitus without complications: Secondary | ICD-10-CM | POA: Diagnosis not present

## 2015-05-06 DIAGNOSIS — N184 Chronic kidney disease, stage 4 (severe): Secondary | ICD-10-CM | POA: Diagnosis not present

## 2015-05-06 DIAGNOSIS — N2581 Secondary hyperparathyroidism of renal origin: Secondary | ICD-10-CM | POA: Diagnosis not present

## 2015-05-06 DIAGNOSIS — I2510 Atherosclerotic heart disease of native coronary artery without angina pectoris: Secondary | ICD-10-CM | POA: Diagnosis not present

## 2015-05-06 DIAGNOSIS — I129 Hypertensive chronic kidney disease with stage 1 through stage 4 chronic kidney disease, or unspecified chronic kidney disease: Secondary | ICD-10-CM | POA: Diagnosis not present

## 2015-05-06 DIAGNOSIS — E1122 Type 2 diabetes mellitus with diabetic chronic kidney disease: Secondary | ICD-10-CM | POA: Diagnosis not present

## 2015-05-06 DIAGNOSIS — N3281 Overactive bladder: Secondary | ICD-10-CM | POA: Diagnosis not present

## 2015-05-06 DIAGNOSIS — E785 Hyperlipidemia, unspecified: Secondary | ICD-10-CM | POA: Diagnosis not present

## 2015-05-07 DIAGNOSIS — E1142 Type 2 diabetes mellitus with diabetic polyneuropathy: Secondary | ICD-10-CM | POA: Diagnosis not present

## 2015-05-07 DIAGNOSIS — B351 Tinea unguium: Secondary | ICD-10-CM | POA: Diagnosis not present

## 2015-05-08 ENCOUNTER — Other Ambulatory Visit: Payer: Self-pay

## 2015-05-08 MED ORDER — INSULIN ASPART 100 UNIT/ML FLEXPEN: 16.0000 [IU] | PEN_INJECTOR | Freq: Three times a day (TID) | SUBCUTANEOUS | Status: DC

## 2015-05-14 ENCOUNTER — Other Ambulatory Visit: Payer: Self-pay

## 2015-05-14 ENCOUNTER — Telehealth: Payer: Self-pay | Admitting: "Endocrinology

## 2015-05-14 MED ORDER — INSULIN ASPART 100 UNIT/ML FLEXPEN: 16.0000 [IU] | PEN_INJECTOR | Freq: Three times a day (TID) | SUBCUTANEOUS | Status: DC

## 2015-05-14 NOTE — Telephone Encounter (Signed)
Spoke w Pts daughter and [facility]. [facility] states that she should be able to pick this up today.

## 2015-05-14 NOTE — Telephone Encounter (Signed)
[facility] still can't get - it needs to be for 30 days - please send to [facility] - and if we have any samples she would like some

## 2015-05-26 DIAGNOSIS — H401132 Primary open-angle glaucoma, bilateral, moderate stage: Secondary | ICD-10-CM | POA: Diagnosis not present

## 2015-05-26 DIAGNOSIS — Z794 Long term (current) use of insulin: Secondary | ICD-10-CM | POA: Diagnosis not present

## 2015-05-26 DIAGNOSIS — E113293 Type 2 diabetes mellitus with mild nonproliferative diabetic retinopathy without macular edema, bilateral: Secondary | ICD-10-CM | POA: Diagnosis not present

## 2015-06-07 DIAGNOSIS — H1032 Unspecified acute conjunctivitis, left eye: Secondary | ICD-10-CM | POA: Diagnosis not present

## 2015-06-07 DIAGNOSIS — J019 Acute sinusitis, unspecified: Secondary | ICD-10-CM | POA: Diagnosis not present

## 2015-06-09 DIAGNOSIS — J040 Acute laryngitis: Secondary | ICD-10-CM | POA: Diagnosis not present

## 2015-06-09 DIAGNOSIS — J069 Acute upper respiratory infection, unspecified: Secondary | ICD-10-CM | POA: Diagnosis not present

## 2015-06-09 DIAGNOSIS — Z6833 Body mass index (BMI) 33.0-33.9, adult: Secondary | ICD-10-CM | POA: Diagnosis not present

## 2015-06-09 DIAGNOSIS — E1122 Type 2 diabetes mellitus with diabetic chronic kidney disease: Secondary | ICD-10-CM | POA: Diagnosis not present

## 2015-06-22 ENCOUNTER — Other Ambulatory Visit: Payer: Self-pay

## 2015-06-22 MED ORDER — INSULIN LISPRO 100 UNIT/ML (KWIKPEN): 15.0000 [IU] | PEN_INJECTOR | Freq: Three times a day (TID) | SUBCUTANEOUS | Status: DC

## 2015-06-23 ENCOUNTER — Other Ambulatory Visit: Payer: Self-pay | Admitting: "Endocrinology

## 2015-06-24 LAB — BASIC METABOLIC PANEL
BUN / CREAT RATIO: 28 (ref 12–28)
BUN: 67 mg/dL — AB (ref 8–27)
CALCIUM: 10.4 mg/dL — AB (ref 8.7–10.3)
CHLORIDE: 97 mmol/L (ref 96–106)
CO2: 25 mmol/L (ref 18–29)
CREATININE: 2.37 mg/dL — AB (ref 0.57–1.00)
GFR calc Af Amer: 23 mL/min/{1.73_m2} — ABNORMAL LOW (ref >59)
GFR calc non Af Amer: 20 mL/min/{1.73_m2} — ABNORMAL LOW (ref >59)
GLUCOSE: 39 mg/dL — AB (ref 65–99)
Potassium: 3.6 mmol/L (ref 3.5–5.2)
Sodium: 144 mmol/L (ref 134–144)

## 2015-06-24 LAB — HGB A1C W/O EAG: HEMOGLOBIN A1C: 9.1 % — AB (ref 4.8–5.6)

## 2015-06-25 DIAGNOSIS — N3020 Other chronic cystitis without hematuria: Secondary | ICD-10-CM | POA: Diagnosis not present

## 2015-06-25 DIAGNOSIS — N3946 Mixed incontinence: Secondary | ICD-10-CM | POA: Diagnosis not present

## 2015-06-30 ENCOUNTER — Encounter: Payer: Self-pay | Admitting: "Endocrinology

## 2015-06-30 ENCOUNTER — Ambulatory Visit: Payer: Medicare Other | Admitting: "Endocrinology

## 2015-06-30 ENCOUNTER — Ambulatory Visit (INDEPENDENT_AMBULATORY_CARE_PROVIDER_SITE_OTHER): Payer: Medicare Other | Admitting: "Endocrinology

## 2015-06-30 VITALS — BP 132/68 | HR 85 | Ht 70.0 in | Wt 225.0 lb

## 2015-06-30 DIAGNOSIS — E1122 Type 2 diabetes mellitus with diabetic chronic kidney disease: Secondary | ICD-10-CM

## 2015-06-30 DIAGNOSIS — I10 Essential (primary) hypertension: Secondary | ICD-10-CM

## 2015-06-30 DIAGNOSIS — E785 Hyperlipidemia, unspecified: Secondary | ICD-10-CM | POA: Diagnosis not present

## 2015-06-30 DIAGNOSIS — Z794 Long term (current) use of insulin: Secondary | ICD-10-CM | POA: Diagnosis not present

## 2015-06-30 DIAGNOSIS — I2510 Atherosclerotic heart disease of native coronary artery without angina pectoris: Secondary | ICD-10-CM

## 2015-06-30 DIAGNOSIS — N184 Chronic kidney disease, stage 4 (severe): Secondary | ICD-10-CM | POA: Diagnosis not present

## 2015-06-30 MED ORDER — GLUCOSE BLOOD VI STRP: ORAL_STRIP | Status: DC

## 2015-06-30 NOTE — Patient Instructions (Signed)

## 2015-06-30 NOTE — Progress Notes (Signed)
Subjective:    Patient ID: [name], female    DOB: [birth year],    Past Medical History  Diagnosis Date  . Hypertension   . Hyperlipidemia   . Diabetes mellitus   . Obesity   . GERD (gastroesophageal reflux disease)   . Heart murmur, systolic   . Anxiety   . Constipation   . Overactive bladder   . Allergic rhinitis   . Low back pain   . Osteoarthritis   . History of hysterectomy    Past Surgical History  Procedure Laterality Date  . Carpal tunnel release    . Trigger finger release    . Cervical biopsy      cervical lymph node biopsies  . Back surgery      multiple  . Coronary stent placement  04/11/2006    2 -- Taxus stents to the circumflex   . Cardiac catheterization  04/04/2006    Est EF of 60%  . Abdominal hysterectomy    . Tendonitis      bilateral elbow  . Colonoscopy  May 2002    Dr. Irving Shows :Followup in 5 years, normal exam  . Colonoscopy  2008    Dr. Laural Golden: Very redundant colon with mild melanosis coli, splenic flexure polyp biopsy with acute complaint of benign colon polyp. Recommended ten-year followup   Social History   Social History  . Marital Status: Married    Spouse Name: N/A  . Number of Children: 3  . Years of Education: N/A   Social History Main Topics  . Smoking status: Never Smoker   . Smokeless tobacco: Never Used  . Alcohol Use: No  . Drug Use: No  . Sexual Activity: Not Asked   Other Topics Concern  . None   Social History Narrative   Outpatient Encounter Prescriptions as of 06/30/2015  Medication Sig  . ALPRAZolam (XANAX) 0.5 MG tablet Take 0.5 mg by mouth at bedtime as needed.    Marland Kitchen aspirin EC 81 MG tablet Take 81 mg by mouth daily.  . benzonatate (TESSALON) 100 MG capsule Take 100 mg by mouth daily as needed for cough.  . calcitRIOL (ROCALTROL) 0.25 MCG capsule Take 1 capsule by mouth 3 (three) times a week.  . clopidogrel (PLAVIX) 75 MG tablet TAKE 1 TABLET BY MOUTH EVERY DAY  . famotidine (PEPCID) 20  MG tablet Take 20 mg by mouth daily.  . ferrous sulfate 325 (65 FE) MG tablet Take 325 mg by mouth daily.  . Flax Oil-Fish Oil-Borage Oil (FISH OIL-FLAX OIL-BORAGE OIL) CAPS Take 2 capsules by mouth daily.  Marland Kitchen gabapentin (NEURONTIN) 100 MG capsule Take 100 mg by mouth as needed.  Marland Kitchen glucose blood (ACCU-CHEK AVIVA) test strip Use to check blood glucose 4 times a day  . insulin aspart (NOVOLOG FLEXPEN) 100 UNIT/ML FlexPen Inject 16 Units into the skin 3 (three) times daily with meals.  . Insulin Degludec (TRESIBA FLEXTOUCH) 100 UNIT/ML SOPN Inject 70 Units into the skin at bedtime.  . insulin lispro (HUMALOG KWIKPEN) 100 UNIT/ML KiwkPen Inject 0.15-0.21 mLs (15-21 Units total) into the skin 3 (three) times daily.  Marland Kitchen latanoprost (XALATAN) 0.005 % ophthalmic solution Place 1 drop into both eyes at bedtime. BOTH EYES 1 DROP AT BEDTIME  . meclizine (ANTIVERT) 12.5 MG tablet Take 12.5 mg by mouth 2 (two) times daily as needed.   . metoprolol tartrate (LOPRESSOR) 25 MG tablet TAKE ONE TABLET BY MOUTH TWICE DAILY  . Olmesartan-Amlodipine-HCTZ (TRIBENZOR) 40-10-25 MG  TABS Take 1 tablet by mouth daily.  . rosuvastatin (CRESTOR) 20 MG tablet Take 20 mg by mouth at bedtime.   . torsemide (DEMADEX) 20 MG tablet Take 1 tablet by mouth 2 (two) times daily.  Marland Kitchen trimethoprim (TRIMPEX) 100 MG tablet 1 TAB DAILY   No facility-administered encounter medications on file as of 06/30/2015.   ALLERGIES: Allergies  Allergen Reactions  . Ace Inhibitors Cough    Pt can tolerate Tribenzor (and ARBs)  . Codeine Rash   VACCINATION STATUS:  There is no immunization history on file for this patient.  Diabetes She presents for her follow-up diabetic visit. She has type 2 diabetes mellitus. Her disease course has been improving. Pertinent negatives for hypoglycemia include no confusion, headaches, pallor or seizures. Associated symptoms include polydipsia and polyuria. Pertinent negatives for diabetes include no chest pain,  no fatigue and no polyphagia. There are no hypoglycemic complications. Symptoms are improving. Diabetic complications include heart disease, nephropathy and retinopathy. Pertinent negatives for diabetic complications include no CVA. Risk factors for coronary artery disease include diabetes mellitus, dyslipidemia, sedentary lifestyle and hypertension. Current diabetic treatment includes insulin injections. She is compliant with treatment most of the time. She is following a generally unhealthy diet. Meal planning includes avoidance of concentrated sweets. She rarely participates in exercise. Her home blood glucose trend is decreasing steadily. Her breakfast blood glucose range is generally 110-130 mg/dl. Her lunch blood glucose range is generally 180-200 mg/dl. Her dinner blood glucose range is generally 180-200 mg/dl. Her overall blood glucose range is 180-200 mg/dl. An ACE inhibitor/angiotensin II receptor blocker is contraindicated (allergic to ACEI). Eye exam is current.  Hypertension This is a chronic problem. The current episode started more than 1 year ago. The problem has been gradually improving since onset. Pertinent negatives include no chest pain, headaches, palpitations or shortness of breath. Hypertensive end-organ damage includes retinopathy. There is no history of CVA.  Hyperlipidemia This is a chronic problem. The current episode started more than 1 year ago. Pertinent negatives include no chest pain, myalgias or shortness of breath. Current antihyperlipidemic treatment includes statins.  Coronary Artery Disease Pertinent negatives include no chest pain, leg swelling, palpitations or shortness of breath. Risk factors include hyperlipidemia and hypertension.     Review of Systems  Constitutional: Negative for fatigue and unexpected weight change.  HENT: Negative for trouble swallowing and voice change.   Eyes: Negative for visual disturbance.  Respiratory: Negative for cough, shortness  of breath and wheezing.   Cardiovascular: Negative for chest pain, palpitations and leg swelling.  Gastrointestinal: Negative for nausea, vomiting and diarrhea.  Endocrine: Positive for polydipsia and polyuria. Negative for cold intolerance, heat intolerance and polyphagia.  Musculoskeletal: Negative for myalgias and arthralgias.  Skin: Negative for color change, pallor, rash and wound.  Neurological: Negative for seizures and headaches.  Psychiatric/Behavioral: Negative for suicidal ideas and confusion.    Objective:    BP 132/68 mmHg  Pulse 85  Ht 5\' 10"  (1.778 m)  Wt 225 lb (102.059 kg)  BMI 32.28 kg/m2  SpO2 96%  Wt Readings from Last 3 Encounters:  06/30/15 225 lb (102.059 kg)  03/30/15 225 lb (102.059 kg)  03/27/15 224 lb (101.606 kg)    Physical Exam  Constitutional: She is oriented to person, place, and time. She appears well-developed.  HENT:  Head: Normocephalic and atraumatic.  Eyes: EOM are normal.  Neck: Normal range of motion. Neck supple. No tracheal deviation present. No thyromegaly present.  Cardiovascular: Normal rate and regular  rhythm.   Pulmonary/Chest: Effort normal and breath sounds normal.  Abdominal: Soft. Bowel sounds are normal. There is no tenderness. There is no guarding.  Musculoskeletal: Normal range of motion. She exhibits no edema.  Neurological: She is alert and oriented to person, place, and time. She has normal reflexes. No cranial nerve deficit. Coordination normal.  Skin: Skin is warm and dry. No rash noted. No erythema. No pallor.  Psychiatric: She has a normal mood and affect. Judgment normal.    Results for orders placed or performed in visit on [id number]  Basic metabolic panel  Result Value Ref Range   Glucose 39 (<) 65 - 99 mg/dL   BUN 67 (H) 8 - 27 mg/dL   Creatinine, Ser 2.37 (H) 0.57 - 1.00 mg/dL   GFR calc non Af Amer 20 (L) >59 mL/min/1.73   GFR calc Af Amer 23 (L) >59 mL/min/1.73   BUN/Creatinine Ratio 28 12 - 28   Sodium  144 134 - 144 mmol/L   Potassium 3.6 3.5 - 5.2 mmol/L   Chloride 97 96 - 106 mmol/L   CO2 25 18 - 29 mmol/L   Calcium 10.4 (H) 8.7 - 10.3 mg/dL  Hgb A1c w/o eAG  Result Value Ref Range   Hgb A1c MFr Bld 9.1 (H) 4.8 - 5.6 %   Chemistry (most recent): Lab Results  Component Value Date   NA 144 06/23/2015   K 3.6 06/23/2015   CL 97 06/23/2015   CO2 25 06/23/2015   BUN 67* 06/23/2015   CREATININE 2.37* 06/23/2015   Diabetic Labs (most recent): Lab Results  Component Value Date   HGBA1C 9.1* 06/23/2015   HGBA1C 10.1* 08/25/2014     Most recent a1c 9.1% , generally improved from 12.3%. Lipid profile (most recent): -Her most recent glucose profile is consistent with improvement.  Assessment & Plan:   1. Type 2 diabetes mellitus with stage 4 chronic kidney disease  -She came with improved blood glucose profile, her A1c Improved to 9.1% generally improving from 12.3%. -Patient is advised to stick to a routine mealtimes to eat 3 meals  a day and avoid unnecessary snacks ( to snack only to correct hypoglycemia). Patient is advised to eliminate simple carbs  from their diet including cakes desserts ice cream soda (  diet and regular) , sweet tea , Candies,  chips and cookies, artificial sweeteners,   and "sugar-free" products .  This will help patient to have stable blood glucose profile and potentially lose weight. Patient is given detailed personalized glucose monitoring and insulin dosing instructions. Patient is instructed to call back with extremes of blood glucose less than 70 or greater than 300. Patient to bring meter and  blood glucose logs during their next visit.  - I will Continue Tresiba 70  units qhs,  continue Novolog 15 units TIDAC plus correction associated with monitoring of BG ac and hs. -Patient is warned not to take insulin without proper monitoring. -She is encouraged to call clinic if she registers blood glucose below 70 or above 300 mg/dL 3 tests in- a- row . -She  does not have a safe oral treatment option for diabetes given advanced chronic kidney disease.  -Patient specific target for A1c, LDL, and triglycerides were discussed with patient.  I will obtain the following labs before her next visit. monitoring and   2. Essential hypertension Pt is allergic to ACEI, controleld. Advised to continue the rest of her medications.  3. Hyperlipemia -continue Crestor.   Follow  up plan: Return in about 3 months (around 09/29/2015) for diabetes, high blood pressure, high cholesterol, follow up with pre-visit labs, meter, and logs.  Glade Lloyd, MD Phone: [phone]  Fax: [phone]   06/30/2015, 11:43 AM

## 2015-07-02 ENCOUNTER — Other Ambulatory Visit: Payer: Self-pay

## 2015-07-02 ENCOUNTER — Telehealth: Payer: Self-pay | Admitting: "Endocrinology

## 2015-07-02 MED ORDER — GLUCOSE BLOOD VI STRP
ORAL_STRIP | Status: DC
Start: 2015-07-02 — End: 2015-12-14

## 2015-07-02 NOTE — Telephone Encounter (Signed)
Accu-chek aviva strips sent to [facility].

## 2015-07-02 NOTE — Telephone Encounter (Signed)
Please call in strips to the [facility] in [place]

## 2015-07-04 ENCOUNTER — Encounter (HOSPITAL_COMMUNITY): Payer: Self-pay | Admitting: Cardiology

## 2015-07-04 ENCOUNTER — Inpatient Hospital Stay (HOSPITAL_COMMUNITY)
Admission: AD | Admit: 2015-07-04 | Discharge: 2015-07-12 | DRG: 270 | Disposition: A | Discharge status: Home / Self Care | Payer: BLUE CROSS/BLUE SHIELD | Source: Other Acute Inpatient Hospital | Attending: Internal Medicine | Admitting: Internal Medicine

## 2015-07-04 ENCOUNTER — Encounter (HOSPITAL_COMMUNITY)
Admission: AD | Disposition: A | Discharge status: Home / Self Care | Payer: Self-pay | Source: Other Acute Inpatient Hospital | Attending: Internal Medicine

## 2015-07-04 DIAGNOSIS — N184 Chronic kidney disease, stage 4 (severe): Secondary | ICD-10-CM

## 2015-07-04 DIAGNOSIS — E7800 Pure hypercholesterolemia, unspecified: Secondary | ICD-10-CM | POA: Diagnosis not present

## 2015-07-04 DIAGNOSIS — R7989 Other specified abnormal findings of blood chemistry: Secondary | ICD-10-CM | POA: Diagnosis not present

## 2015-07-04 DIAGNOSIS — Z7902 Long term (current) use of antithrombotics/antiplatelets: Secondary | ICD-10-CM | POA: Diagnosis not present

## 2015-07-04 DIAGNOSIS — E1122 Type 2 diabetes mellitus with diabetic chronic kidney disease: Secondary | ICD-10-CM | POA: Diagnosis present

## 2015-07-04 DIAGNOSIS — Z79899 Other long term (current) drug therapy: Secondary | ICD-10-CM

## 2015-07-04 DIAGNOSIS — J811 Chronic pulmonary edema: Secondary | ICD-10-CM | POA: Diagnosis not present

## 2015-07-04 DIAGNOSIS — I998 Other disorder of circulatory system: Secondary | ICD-10-CM | POA: Diagnosis not present

## 2015-07-04 DIAGNOSIS — K5900 Constipation, unspecified: Secondary | ICD-10-CM | POA: Diagnosis not present

## 2015-07-04 DIAGNOSIS — J90 Pleural effusion, not elsewhere classified: Secondary | ICD-10-CM | POA: Diagnosis not present

## 2015-07-04 DIAGNOSIS — N3281 Overactive bladder: Secondary | ICD-10-CM | POA: Diagnosis present

## 2015-07-04 DIAGNOSIS — N179 Acute kidney failure, unspecified: Secondary | ICD-10-CM | POA: Diagnosis not present

## 2015-07-04 DIAGNOSIS — I214 Non-ST elevation (NSTEMI) myocardial infarction: Principal | ICD-10-CM

## 2015-07-04 DIAGNOSIS — Z7982 Long term (current) use of aspirin: Secondary | ICD-10-CM

## 2015-07-04 DIAGNOSIS — R748 Abnormal levels of other serum enzymes: Secondary | ICD-10-CM | POA: Diagnosis not present

## 2015-07-04 DIAGNOSIS — R791 Abnormal coagulation profile: Secondary | ICD-10-CM | POA: Diagnosis not present

## 2015-07-04 DIAGNOSIS — E669 Obesity, unspecified: Secondary | ICD-10-CM | POA: Diagnosis present

## 2015-07-04 DIAGNOSIS — R0602 Shortness of breath: Secondary | ICD-10-CM | POA: Diagnosis not present

## 2015-07-04 DIAGNOSIS — R0989 Other specified symptoms and signs involving the circulatory and respiratory systems: Secondary | ICD-10-CM | POA: Diagnosis not present

## 2015-07-04 DIAGNOSIS — M545 Low back pain: Secondary | ICD-10-CM | POA: Diagnosis not present

## 2015-07-04 DIAGNOSIS — I509 Heart failure, unspecified: Secondary | ICD-10-CM

## 2015-07-04 DIAGNOSIS — D539 Nutritional anemia, unspecified: Secondary | ICD-10-CM | POA: Diagnosis present

## 2015-07-04 DIAGNOSIS — J984 Other disorders of lung: Secondary | ICD-10-CM | POA: Diagnosis not present

## 2015-07-04 DIAGNOSIS — R0600 Dyspnea, unspecified: Secondary | ICD-10-CM | POA: Diagnosis not present

## 2015-07-04 DIAGNOSIS — E785 Hyperlipidemia, unspecified: Secondary | ICD-10-CM | POA: Diagnosis present

## 2015-07-04 DIAGNOSIS — M1711 Unilateral primary osteoarthritis, right knee: Secondary | ICD-10-CM | POA: Diagnosis not present

## 2015-07-04 DIAGNOSIS — E119 Type 2 diabetes mellitus without complications: Secondary | ICD-10-CM | POA: Diagnosis not present

## 2015-07-04 DIAGNOSIS — M1990 Unspecified osteoarthritis, unspecified site: Secondary | ICD-10-CM | POA: Diagnosis present

## 2015-07-04 DIAGNOSIS — E872 Acidosis: Secondary | ICD-10-CM | POA: Diagnosis present

## 2015-07-04 DIAGNOSIS — I255 Ischemic cardiomyopathy: Secondary | ICD-10-CM | POA: Diagnosis present

## 2015-07-04 DIAGNOSIS — J9621 Acute and chronic respiratory failure with hypoxia: Secondary | ICD-10-CM

## 2015-07-04 DIAGNOSIS — R030 Elevated blood-pressure reading, without diagnosis of hypertension: Secondary | ICD-10-CM | POA: Diagnosis not present

## 2015-07-04 DIAGNOSIS — I10 Essential (primary) hypertension: Secondary | ICD-10-CM | POA: Diagnosis present

## 2015-07-04 DIAGNOSIS — I2510 Atherosclerotic heart disease of native coronary artery without angina pectoris: Secondary | ICD-10-CM | POA: Diagnosis not present

## 2015-07-04 DIAGNOSIS — I5043 Acute on chronic combined systolic (congestive) and diastolic (congestive) heart failure: Secondary | ICD-10-CM

## 2015-07-04 DIAGNOSIS — I82B11 Acute embolism and thrombosis of right subclavian vein: Secondary | ICD-10-CM | POA: Diagnosis not present

## 2015-07-04 DIAGNOSIS — I25119 Atherosclerotic heart disease of native coronary artery with unspecified angina pectoris: Secondary | ICD-10-CM | POA: Diagnosis not present

## 2015-07-04 DIAGNOSIS — Z955 Presence of coronary angioplasty implant and graft: Secondary | ICD-10-CM | POA: Diagnosis not present

## 2015-07-04 DIAGNOSIS — E11649 Type 2 diabetes mellitus with hypoglycemia without coma: Secondary | ICD-10-CM | POA: Diagnosis present

## 2015-07-04 DIAGNOSIS — G8929 Other chronic pain: Secondary | ICD-10-CM | POA: Diagnosis present

## 2015-07-04 DIAGNOSIS — M25461 Effusion, right knee: Secondary | ICD-10-CM | POA: Diagnosis not present

## 2015-07-04 DIAGNOSIS — I771 Stricture of artery: Secondary | ICD-10-CM | POA: Diagnosis present

## 2015-07-04 DIAGNOSIS — M11261 Other chondrocalcinosis, right knee: Secondary | ICD-10-CM | POA: Diagnosis not present

## 2015-07-04 DIAGNOSIS — Z452 Encounter for adjustment and management of vascular access device: Secondary | ICD-10-CM

## 2015-07-04 DIAGNOSIS — K219 Gastro-esophageal reflux disease without esophagitis: Secondary | ICD-10-CM | POA: Diagnosis present

## 2015-07-04 DIAGNOSIS — N189 Chronic kidney disease, unspecified: Secondary | ICD-10-CM | POA: Diagnosis present

## 2015-07-04 DIAGNOSIS — D638 Anemia in other chronic diseases classified elsewhere: Secondary | ICD-10-CM | POA: Diagnosis present

## 2015-07-04 DIAGNOSIS — I748 Embolism and thrombosis of other arteries: Secondary | ICD-10-CM | POA: Diagnosis not present

## 2015-07-04 DIAGNOSIS — T82898A Other specified complication of vascular prosthetic devices, implants and grafts, initial encounter: Secondary | ICD-10-CM

## 2015-07-04 DIAGNOSIS — E1121 Type 2 diabetes mellitus with diabetic nephropathy: Secondary | ICD-10-CM | POA: Diagnosis present

## 2015-07-04 DIAGNOSIS — J9602 Acute respiratory failure with hypercapnia: Secondary | ICD-10-CM | POA: Diagnosis present

## 2015-07-04 DIAGNOSIS — M25561 Pain in right knee: Secondary | ICD-10-CM

## 2015-07-04 DIAGNOSIS — Z794 Long term (current) use of insulin: Secondary | ICD-10-CM | POA: Diagnosis not present

## 2015-07-04 DIAGNOSIS — Z885 Allergy status to narcotic agent status: Secondary | ICD-10-CM | POA: Diagnosis not present

## 2015-07-04 DIAGNOSIS — I708 Atherosclerosis of other arteries: Secondary | ICD-10-CM | POA: Diagnosis present

## 2015-07-04 DIAGNOSIS — Z6836 Body mass index (BMI) 36.0-36.9, adult: Secondary | ICD-10-CM | POA: Diagnosis not present

## 2015-07-04 DIAGNOSIS — N289 Disorder of kidney and ureter, unspecified: Secondary | ICD-10-CM | POA: Diagnosis not present

## 2015-07-04 DIAGNOSIS — J9622 Acute and chronic respiratory failure with hypercapnia: Secondary | ICD-10-CM | POA: Diagnosis not present

## 2015-07-04 DIAGNOSIS — M179 Osteoarthritis of knee, unspecified: Secondary | ICD-10-CM | POA: Diagnosis not present

## 2015-07-04 DIAGNOSIS — R202 Paresthesia of skin: Secondary | ICD-10-CM | POA: Diagnosis not present

## 2015-07-04 DIAGNOSIS — I130 Hypertensive heart and chronic kidney disease with heart failure and stage 1 through stage 4 chronic kidney disease, or unspecified chronic kidney disease: Secondary | ICD-10-CM | POA: Diagnosis present

## 2015-07-04 DIAGNOSIS — R778 Other specified abnormalities of plasma proteins: Secondary | ICD-10-CM | POA: Insufficient documentation

## 2015-07-04 DIAGNOSIS — R0902 Hypoxemia: Secondary | ICD-10-CM | POA: Diagnosis not present

## 2015-07-04 DIAGNOSIS — I5041 Acute combined systolic (congestive) and diastolic (congestive) heart failure: Secondary | ICD-10-CM | POA: Diagnosis present

## 2015-07-04 DIAGNOSIS — J810 Acute pulmonary edema: Secondary | ICD-10-CM | POA: Diagnosis not present

## 2015-07-04 DIAGNOSIS — I75011 Atheroembolism of right upper extremity: Secondary | ICD-10-CM | POA: Diagnosis not present

## 2015-07-04 HISTORY — DX: Chronic kidney disease, stage 4 (severe): N18.4

## 2015-07-04 HISTORY — PX: CARDIAC CATHETERIZATION: SHX172

## 2015-07-04 HISTORY — DX: Acute kidney failure, unspecified: N17.9

## 2015-07-04 HISTORY — DX: Chronic kidney disease, stage 4 (severe): N17.9

## 2015-07-04 LAB — CBC WITH DIFFERENTIAL/PLATELET
BASOS ABS: 0 10*3/uL (ref 0.0–0.1)
Basophils Relative: 0 %
EOS PCT: 0 %
Eosinophils Absolute: 0 10*3/uL (ref 0.0–0.7)
HEMATOCRIT: 35.6 % — AB (ref 36.0–46.0)
Hemoglobin: 11 g/dL — ABNORMAL LOW (ref 12.0–15.0)
LYMPHS PCT: 12 %
Lymphs Abs: 0.9 10*3/uL (ref 0.7–4.0)
MCH: 31.5 pg (ref 26.0–34.0)
MCHC: 30.9 g/dL (ref 30.0–36.0)
MCV: 102 fL — AB (ref 78.0–100.0)
MONO ABS: 0.6 10*3/uL (ref 0.1–1.0)
MONOS PCT: 8 %
NEUTROS ABS: 6.3 10*3/uL (ref 1.7–7.7)
Neutrophils Relative %: 80 %
PLATELETS: 171 10*3/uL (ref 150–400)
RBC: 3.49 MIL/uL — ABNORMAL LOW (ref 3.87–5.11)
RDW: 14.5 % (ref 11.5–15.5)
WBC: 7.8 10*3/uL (ref 4.0–10.5)

## 2015-07-04 LAB — COMPREHENSIVE METABOLIC PANEL
ALBUMIN: 3.9 g/dL (ref 3.5–5.0)
ALT: 42 U/L (ref 14–54)
AST: 107 U/L — AB (ref 15–41)
Alkaline Phosphatase: 71 U/L (ref 38–126)
Anion gap: 16 — ABNORMAL HIGH (ref 5–15)
BILIRUBIN TOTAL: 0.6 mg/dL (ref 0.3–1.2)
BUN: 78 mg/dL — AB (ref 6–20)
CHLORIDE: 94 mmol/L — AB (ref 101–111)
CO2: 26 mmol/L (ref 22–32)
CREATININE: 2.97 mg/dL — AB (ref 0.44–1.00)
Calcium: 9.8 mg/dL (ref 8.9–10.3)
GFR calc Af Amer: 17 mL/min — ABNORMAL LOW (ref >60)
GFR, EST NON AFRICAN AMERICAN: 15 mL/min — AB (ref >60)
GLUCOSE: 496 mg/dL — AB (ref 65–99)
Potassium: 4.3 mmol/L (ref 3.5–5.1)
Sodium: 136 mmol/L (ref 135–145)
Total Protein: 7.9 g/dL (ref 6.5–8.1)

## 2015-07-04 LAB — TROPONIN I
TROPONIN I: 17.33 ng/mL — AB (ref <0.031)
TROPONIN I: 17.98 ng/mL — AB (ref <0.031)

## 2015-07-04 LAB — PROTIME-INR
INR: 1.16 (ref 0.00–1.49)
Prothrombin Time: 15 seconds (ref 11.6–15.2)

## 2015-07-04 LAB — MRSA PCR SCREENING: MRSA by PCR: NEGATIVE

## 2015-07-04 LAB — GLUCOSE, CAPILLARY
GLUCOSE-CAPILLARY: 387 mg/dL — AB (ref 65–99)
GLUCOSE-CAPILLARY: 504 mg/dL — AB (ref 65–99)
GLUCOSE-CAPILLARY: 406 mg/dL — AB (ref 65–99)

## 2015-07-04 LAB — HEPARIN LEVEL (UNFRACTIONATED): Heparin Unfractionated: 0.21 IU/mL — ABNORMAL LOW (ref 0.30–0.70)

## 2015-07-04 LAB — BRAIN NATRIURETIC PEPTIDE: B NATRIURETIC PEPTIDE 5: 663.6 pg/mL — AB (ref 0.0–100.0)

## 2015-07-04 SURGERY — LEFT HEART CATH AND CORONARY ANGIOGRAPHY
Anesthesia: LOCAL

## 2015-07-04 MED ORDER — NITROGLYCERIN 1 MG/10 ML FOR IR/CATH LAB
INTRA_ARTERIAL | Status: AC
  Filled 2015-07-04: qty 10

## 2015-07-04 MED ORDER — FUROSEMIDE 10 MG/ML IJ SOLN
80.0000 mg | Freq: Two times a day (BID) | INTRAMUSCULAR | Status: DC
  Administered 2015-07-04 – 2015-07-06 (×5): 80 mg via INTRAVENOUS
  Filled 2015-07-04 (×4): qty 8

## 2015-07-04 MED ORDER — IOPAMIDOL (ISOVUE-370) INJECTION 76%
INTRAVENOUS | Status: DC | PRN
Start: 2015-07-04 — End: 2015-07-04
  Administered 2015-07-04: 185 mL via INTRAVENOUS

## 2015-07-04 MED ORDER — MIDAZOLAM HCL 2 MG/2ML IJ SOLN
INTRAMUSCULAR | Status: AC
  Filled 2015-07-04: qty 2

## 2015-07-04 MED ORDER — INSULIN DEGLUDEC 100 UNIT/ML ~~LOC~~ SOPN: 35.0000 [IU] | PEN_INJECTOR | Freq: Every day | SUBCUTANEOUS | Status: DC

## 2015-07-04 MED ORDER — SODIUM CHLORIDE 0.9 % IV SOLN: 250.0000 mL | INTRAVENOUS | Status: DC | PRN

## 2015-07-04 MED ORDER — AMLODIPINE BESYLATE 5 MG PO TABS
5.0000 mg | ORAL_TABLET | Freq: Every day | ORAL | Status: DC
  Administered 2015-07-04 – 2015-07-12 (×7): 5 mg via ORAL
  Filled 2015-07-04 (×7): qty 1

## 2015-07-04 MED ORDER — SODIUM CHLORIDE 0.9 % IV SOLN
INTRAVENOUS | Status: DC | PRN
  Administered 2015-07-04: 10 mL/h via INTRAVENOUS

## 2015-07-04 MED ORDER — ALPRAZOLAM 0.5 MG PO TABS
0.5000 mg | ORAL_TABLET | Freq: Two times a day (BID) | ORAL | Status: DC | PRN
  Administered 2015-07-04 – 2015-07-12 (×10): 0.5 mg via ORAL
  Filled 2015-07-04 (×10): qty 1

## 2015-07-04 MED ORDER — METOPROLOL TARTRATE 5 MG/5ML IV SOLN
2.5000 mg | Freq: Once | INTRAVENOUS | Status: AC
  Administered 2015-07-04: 2.5 mg via INTRAVENOUS

## 2015-07-04 MED ORDER — ONDANSETRON HCL 4 MG/2ML IJ SOLN: 4.0000 mg | Freq: Four times a day (QID) | INTRAMUSCULAR | Status: DC | PRN

## 2015-07-04 MED ORDER — ASPIRIN 81 MG PO CHEW: 81.0000 mg | CHEWABLE_TABLET | ORAL | Status: DC

## 2015-07-04 MED ORDER — FUROSEMIDE 10 MG/ML IJ SOLN
80.0000 mg | Freq: Two times a day (BID) | INTRAMUSCULAR | Status: DC
  Filled 2015-07-04: qty 8

## 2015-07-04 MED ORDER — INSULIN ASPART 100 UNIT/ML ~~LOC~~ SOLN: 0.0000 [IU] | Freq: Three times a day (TID) | SUBCUTANEOUS | Status: DC

## 2015-07-04 MED ORDER — TICAGRELOR 90 MG PO TABS
90.0000 mg | ORAL_TABLET | Freq: Two times a day (BID) | ORAL | Status: DC
  Administered 2015-07-05 – 2015-07-12 (×13): 90 mg via ORAL
  Filled 2015-07-04 (×13): qty 1

## 2015-07-04 MED ORDER — SODIUM CHLORIDE 0.9% FLUSH
3.0000 mL | INTRAVENOUS | Status: DC | PRN
  Administered 2015-07-11: 3 mL via INTRAVENOUS
  Filled 2015-07-04: qty 3

## 2015-07-04 MED ORDER — HEPARIN (PORCINE) IN NACL 100-0.45 UNIT/ML-% IJ SOLN
1100.0000 [IU]/h | INTRAMUSCULAR | Status: DC
  Administered 2015-07-04: 1100 [IU]/h via INTRAVENOUS
  Filled 2015-07-04: qty 250

## 2015-07-04 MED ORDER — METOPROLOL TARTRATE 5 MG/5ML IV SOLN
INTRAVENOUS | Status: AC
  Filled 2015-07-04: qty 5

## 2015-07-04 MED ORDER — CALCITRIOL 0.25 MCG PO CAPS
0.2500 ug | ORAL_CAPSULE | ORAL | Status: DC
  Administered 2015-07-06 – 2015-07-08 (×2): 0.25 ug via ORAL
  Filled 2015-07-04 (×3): qty 1

## 2015-07-04 MED ORDER — METOPROLOL TARTRATE 25 MG PO TABS
25.0000 mg | ORAL_TABLET | Freq: Two times a day (BID) | ORAL | Status: DC
  Administered 2015-07-04 – 2015-07-05 (×3): 25 mg via ORAL
  Filled 2015-07-04 (×3): qty 1

## 2015-07-04 MED ORDER — SODIUM CHLORIDE 0.9% FLUSH
3.0000 mL | Freq: Two times a day (BID) | INTRAVENOUS | Status: DC
  Administered 2015-07-05 – 2015-07-11 (×9): 3 mL via INTRAVENOUS

## 2015-07-04 MED ORDER — MIDAZOLAM HCL 2 MG/2ML IJ SOLN
INTRAMUSCULAR | Status: DC | PRN
  Administered 2015-07-04 (×3): 1 mg via INTRAVENOUS

## 2015-07-04 MED ORDER — BIVALIRUDIN 250 MG IV SOLR
INTRAVENOUS | Status: AC
  Filled 2015-07-04: qty 250

## 2015-07-04 MED ORDER — HEPARIN (PORCINE) IN NACL 2-0.9 UNIT/ML-% IJ SOLN
INTRAMUSCULAR | Status: AC
  Filled 2015-07-04: qty 1000

## 2015-07-04 MED ORDER — SODIUM CHLORIDE 0.9% FLUSH: 3.0000 mL | INTRAVENOUS | Status: DC | PRN

## 2015-07-04 MED ORDER — SODIUM CHLORIDE 0.9 % IV SOLN
INTRAVENOUS | Status: DC
  Administered 2015-07-04: 100 mL via INTRAVENOUS
  Administered 2015-07-05: 75 mL via INTRAVENOUS

## 2015-07-04 MED ORDER — HYDRALAZINE HCL 10 MG PO TABS
10.0000 mg | ORAL_TABLET | Freq: Three times a day (TID) | ORAL | Status: DC
  Administered 2015-07-04 – 2015-07-06 (×6): 10 mg via ORAL
  Filled 2015-07-04 (×6): qty 1

## 2015-07-04 MED ORDER — INSULIN DETEMIR 100 UNIT/ML ~~LOC~~ SOLN
15.0000 [IU] | Freq: Two times a day (BID) | SUBCUTANEOUS | Status: DC
  Administered 2015-07-04 – 2015-07-05 (×3): 15 [IU] via SUBCUTANEOUS
  Filled 2015-07-04 (×4): qty 0.15

## 2015-07-04 MED ORDER — SODIUM CHLORIDE 0.9 % IV SOLN
INTRAVENOUS | Status: DC
  Administered 2015-07-04: 19:00:00 via INTRAVENOUS

## 2015-07-04 MED ORDER — ASPIRIN EC 81 MG PO TBEC
81.0000 mg | DELAYED_RELEASE_TABLET | Freq: Every day | ORAL | Status: DC
  Administered 2015-07-04: 81 mg via ORAL
  Filled 2015-07-04: qty 1

## 2015-07-04 MED ORDER — CLOPIDOGREL BISULFATE 75 MG PO TABS
75.0000 mg | ORAL_TABLET | Freq: Every day | ORAL | Status: DC
  Administered 2015-07-04: 75 mg via ORAL
  Filled 2015-07-04: qty 1

## 2015-07-04 MED ORDER — SODIUM CHLORIDE 0.9 % IV SOLN
250.0000 mg | INTRAVENOUS | Status: DC | PRN
  Administered 2015-07-04 (×2): 1 mg/kg/h via INTRAVENOUS

## 2015-07-04 MED ORDER — BENZONATATE 100 MG PO CAPS
100.0000 mg | ORAL_CAPSULE | Freq: Every day | ORAL | Status: DC | PRN
  Administered 2015-07-05: 100 mg via ORAL
  Filled 2015-07-04: qty 1

## 2015-07-04 MED ORDER — BIVALIRUDIN BOLUS VIA INFUSION - CUPID
INTRAVENOUS | Status: DC | PRN
  Administered 2015-07-04: 77.925 mg via INTRAVENOUS

## 2015-07-04 MED ORDER — DIAZEPAM 5 MG PO TABS: 5.0000 mg | ORAL_TABLET | ORAL | Status: DC | PRN

## 2015-07-04 MED ORDER — ASPIRIN EC 81 MG PO TBEC
81.0000 mg | DELAYED_RELEASE_TABLET | Freq: Every day | ORAL | Status: DC
  Administered 2015-07-05 – 2015-07-12 (×6): 81 mg via ORAL
  Filled 2015-07-04 (×6): qty 1

## 2015-07-04 MED ORDER — NITROGLYCERIN IN D5W 200-5 MCG/ML-% IV SOLN
INTRAVENOUS | Status: AC
  Filled 2015-07-04: qty 250

## 2015-07-04 MED ORDER — INSULIN ASPART 100 UNIT/ML ~~LOC~~ SOLN
0.0000 [IU] | Freq: Three times a day (TID) | SUBCUTANEOUS | Status: DC
  Administered 2015-07-04: 20 [IU] via SUBCUTANEOUS
  Administered 2015-07-05: 4 [IU] via SUBCUTANEOUS
  Administered 2015-07-05: 11 [IU] via SUBCUTANEOUS
  Administered 2015-07-05: 15 [IU] via SUBCUTANEOUS
  Administered 2015-07-06: 7 [IU] via SUBCUTANEOUS
  Administered 2015-07-06: 3 [IU] via SUBCUTANEOUS
  Administered 2015-07-07: 11 [IU] via SUBCUTANEOUS
  Administered 2015-07-07 – 2015-07-08 (×5): 7 [IU] via SUBCUTANEOUS
  Administered 2015-07-09: 4 [IU] via SUBCUTANEOUS

## 2015-07-04 MED ORDER — LIDOCAINE HCL (PF) 1 % IJ SOLN
INTRAMUSCULAR | Status: AC
  Filled 2015-07-04: qty 30

## 2015-07-04 MED ORDER — ZOLPIDEM TARTRATE 5 MG PO TABS: 5.0000 mg | ORAL_TABLET | Freq: Every evening | ORAL | Status: DC | PRN

## 2015-07-04 MED ORDER — TICAGRELOR 90 MG PO TABS
ORAL_TABLET | ORAL | Status: DC | PRN
  Administered 2015-07-04: 180 mg via ORAL

## 2015-07-04 MED ORDER — ACETAMINOPHEN 325 MG PO TABS
650.0000 mg | ORAL_TABLET | Freq: Four times a day (QID) | ORAL | Status: DC | PRN
  Administered 2015-07-04: 650 mg via ORAL
  Filled 2015-07-04: qty 2

## 2015-07-04 MED ORDER — CETYLPYRIDINIUM CHLORIDE 0.05 % MT LIQD: 7.0000 mL | Freq: Two times a day (BID) | OROMUCOSAL | Status: DC

## 2015-07-04 MED ORDER — TICAGRELOR 90 MG PO TABS
ORAL_TABLET | ORAL | Status: AC
  Filled 2015-07-04: qty 2

## 2015-07-04 MED ORDER — NITROGLYCERIN 2 % TD OINT
0.5000 [in_us] | TOPICAL_OINTMENT | Freq: Four times a day (QID) | TRANSDERMAL | Status: DC
  Filled 2015-07-04: qty 30

## 2015-07-04 MED ORDER — IOPAMIDOL (ISOVUE-370) INJECTION 76%
INTRAVENOUS | Status: AC
  Filled 2015-07-04: qty 100

## 2015-07-04 MED ORDER — FENTANYL CITRATE (PF) 100 MCG/2ML IJ SOLN
INTRAMUSCULAR | Status: AC
  Filled 2015-07-04: qty 2

## 2015-07-04 MED ORDER — NITROGLYCERIN IN D5W 200-5 MCG/ML-% IV SOLN
0.0000 ug/min | INTRAVENOUS | Status: DC
  Administered 2015-07-04: 10 ug/min via INTRAVENOUS

## 2015-07-04 MED ORDER — FAMOTIDINE 20 MG PO TABS
20.0000 mg | ORAL_TABLET | Freq: Every day | ORAL | Status: DC
  Administered 2015-07-04 – 2015-07-12 (×7): 20 mg via ORAL
  Filled 2015-07-04 (×7): qty 1

## 2015-07-04 MED ORDER — HEPARIN BOLUS VIA INFUSION
4000.0000 [IU] | Freq: Once | INTRAVENOUS | Status: AC
  Administered 2015-07-04: 4000 [IU] via INTRAVENOUS
  Filled 2015-07-04: qty 4000

## 2015-07-04 MED ORDER — FENTANYL CITRATE (PF) 100 MCG/2ML IJ SOLN
INTRAMUSCULAR | Status: DC | PRN
  Administered 2015-07-04 (×2): 25 ug via INTRAVENOUS

## 2015-07-04 MED ORDER — ACETAMINOPHEN 325 MG PO TABS
650.0000 mg | ORAL_TABLET | ORAL | Status: DC | PRN
  Administered 2015-07-05 – 2015-07-08 (×9): 650 mg via ORAL
  Filled 2015-07-04 (×9): qty 2

## 2015-07-04 MED ORDER — NITROGLYCERIN 1 MG/10 ML FOR IR/CATH LAB
INTRA_ARTERIAL | Status: DC | PRN
  Administered 2015-07-04 (×2): 200 ug via INTRACORONARY

## 2015-07-04 MED ORDER — LATANOPROST 0.005 % OP SOLN
1.0000 [drp] | Freq: Every day | OPHTHALMIC | Status: DC
  Administered 2015-07-04 – 2015-07-12 (×7): 1 [drp] via OPHTHALMIC
  Filled 2015-07-04 (×5): qty 2.5

## 2015-07-04 MED ORDER — FERROUS SULFATE 325 (65 FE) MG PO TABS
325.0000 mg | ORAL_TABLET | Freq: Every day | ORAL | Status: DC
  Administered 2015-07-04 – 2015-07-12 (×7): 325 mg via ORAL
  Filled 2015-07-04 (×8): qty 1

## 2015-07-04 MED ORDER — NITROGLYCERIN IN D5W 200-5 MCG/ML-% IV SOLN
0.0000 ug/min | INTRAVENOUS | Status: DC
  Administered 2015-07-04: 20 ug/min via INTRAVENOUS

## 2015-07-04 MED ORDER — VERAPAMIL HCL 2.5 MG/ML IV SOLN
INTRAVENOUS | Status: AC
  Filled 2015-07-04: qty 2

## 2015-07-04 MED ORDER — SODIUM CHLORIDE 0.9 % IV SOLN: 0.2500 mg/kg/h | INTRAVENOUS | Status: AC

## 2015-07-04 MED ORDER — LIDOCAINE HCL (PF) 1 % IJ SOLN
INTRAMUSCULAR | Status: DC | PRN
  Administered 2015-07-04: 20 mL via INTRADERMAL

## 2015-07-04 MED ORDER — ROSUVASTATIN CALCIUM 20 MG PO TABS
20.0000 mg | ORAL_TABLET | Freq: Every day | ORAL | Status: DC
  Administered 2015-07-04 – 2015-07-06 (×3): 20 mg via ORAL
  Filled 2015-07-04 (×3): qty 1

## 2015-07-04 MED ORDER — INSULIN ASPART 100 UNIT/ML ~~LOC~~ SOLN
8.0000 [IU] | Freq: Once | SUBCUTANEOUS | Status: AC
  Administered 2015-07-04: 8 [IU] via SUBCUTANEOUS

## 2015-07-04 MED ORDER — ATORVASTATIN CALCIUM 80 MG PO TABS: 80.0000 mg | ORAL_TABLET | Freq: Every day | ORAL | Status: DC

## 2015-07-04 MED ORDER — HEPARIN (PORCINE) IN NACL 2-0.9 UNIT/ML-% IJ SOLN
INTRAMUSCULAR | Status: DC | PRN
  Administered 2015-07-04: 1000 mL

## 2015-07-04 MED ORDER — SODIUM CHLORIDE 0.9% FLUSH: 3.0000 mL | Freq: Two times a day (BID) | INTRAVENOUS | Status: DC

## 2015-07-04 SURGICAL SUPPLY — 22 items
BALLN ANGIOSCULPT RX 2.0X10 (BALLOONS) ×2
BALLN EMERGE MR 2.0X12 (BALLOONS) ×2
BALLN ~~LOC~~ EMERGE MR 2.75X12 (BALLOONS) ×2
BALLOON ANGIOSCULPT RX 2.0X10 (BALLOONS) ×1 IMPLANT
BALLOON EMERGE MR 2.0X12 (BALLOONS) ×1 IMPLANT
BALLOON ~~LOC~~ EMERGE MR 2.75X12 (BALLOONS) ×1 IMPLANT
CATH INFINITI 5FR MULTPACK ANG (CATHETERS) ×2 IMPLANT
CATH OPTITORQUE TIG 4.0 5F (CATHETERS) IMPLANT
CATH SITESEER 5F NTR (CATHETERS) ×2 IMPLANT
GLIDESHEATH SLEND SS 6F .021 (SHEATH) IMPLANT
GUIDE CATH RUNWAY 6FR FR4 (CATHETERS) ×2 IMPLANT
KIT ENCORE 26 ADVANTAGE (KITS) ×2 IMPLANT
KIT HEART LEFT (KITS) ×2 IMPLANT
PACK CARDIAC CATHETERIZATION (CUSTOM PROCEDURE TRAY) ×2 IMPLANT
SHEATH PINNACLE 6F 10CM (SHEATH) ×2 IMPLANT
STENT RESOLUTE INTEG 2.5X14 (Permanent Stent) ×2 IMPLANT
STENT RESOLUTE INTEG 2.5X18 (Permanent Stent) ×2 IMPLANT
TRANSDUCER W/STOPCOCK (MISCELLANEOUS) ×2 IMPLANT
TUBING CIL FLEX 10 FLL-RA (TUBING) ×2 IMPLANT
WIRE COUGAR XT STRL 190CM (WIRE) ×2 IMPLANT
WIRE EMERALD 3MM-J .035X150CM (WIRE) ×2 IMPLANT
WIRE SAFE-T 1.5MM-J .035X260CM (WIRE) IMPLANT

## 2015-07-04 NOTE — Progress Notes (Signed)
Patient becoming increasingly SOB, having to lean forward in bed and unable to complete sentences without becoming winded.  Lungs have increasing crackles bilaterally from last auscultation by RN at 1000.  Patient on 4L [place].  MD notified, lasix given early and order for BiPap received; RT nofitied  Will continue to monitor. Cliff, Ardeth Sportsman

## 2015-07-04 NOTE — Progress Notes (Addendum)
Patient arrived from [facility] notified of patient's arrival.

## 2015-07-04 NOTE — Consult Note (Addendum)
Admit date: 07/04/2015 Referring Physician: EF at 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  She has had normal LVF with last assessment by echo 2013.  She also has a history of HTN, dyslipidemia, Fe def anemia, CKD (followed by Dr. Lorrene Reid) and IDDM.  She had some fatigue in summer of 2015 and was scheduled for cath but this was cancelled due to elevated creatinine at 2.2 and never rescheduled.     She says that yesterday am around 5am she woke up and was diaphoretic and weak and thought her BS was down and drank 2 glasses of OJ and at 6am her BS was 77 and she still felt bad.  She started feeling better but then last night she started having back pain with pain down her her legs bilaterally.  Around 10pm she went to change a dressing on her daughters leg and her daughter noted that she was SOB, coughing and wheezing and so she took some Robitussin.  She went to bed and awakened at 2am with severe wheezing and took an old Albuterol inhaler which did not help and she went to [facility] ER by EMS.   In ER She was found to be in acute respiratory distress and placed on BiPAP at 16/8cm H2O.  Her pro BNp was elevated at 4963, D-Dimer 2.86, ABG ph 7.24 with PO2 62 and PCO2 68 on 100% NR and was placed on BiPAP with 60% O2.  Chest xray showed CHF.  Glucose was markedly elevated at 337 with worsening renal function with Creatinine 2.94 and BUN 79.  Troponin noted to be 2.59.  WBC normal at 9.8   She is now transferred for further evaluation.  She is doing well now with much less SOB.  She denied any chest pain or pressure but had some mild fullness in her chest last night.  She has chronic LE edema for some time but was not aware of any increase  in swelling recently. She denies any dizziness, palpitations or syncope.  She denies any fever or chills.  She has had a nonproductive cough.      PMH:    Past Medical History  Diagnosis Date  . Hypertension   . Hyperlipidemia   . Diabetes mellitus   . Obesity   . GERD (gastroesophageal reflux disease)   . Heart murmur, systolic   . Anxiety   . Constipation   . Overactive bladder   . Allergic rhinitis   . Low back pain   . Osteoarthritis   . History of hysterectomy     PSH:    Past Surgical History  Procedure Laterality Date  . Carpal tunnel release    . Trigger finger release    . Cervical biopsy      cervical lymph node biopsies  . Back surgery      multiple  . Coronary stent placement  04/11/2006    2 -- Taxus stents to the circumflex   . Cardiac catheterization  04/04/2006    Est EF of 60%  . Abdominal hysterectomy    . Tendonitis      bilateral elbow  . Colonoscopy  May 2002    Dr. Irving Shows :Followup in 5 years, normal exam  .  Colonoscopy  2008    Dr. Laural Golden: Very redundant colon with mild melanosis coli, splenic flexure polyp biopsy with acute complaint of benign colon polyp. Recommended ten-year followup    ALLERGIES:   Ace inhibitors and Codeine  Prior to Admit Meds:   Prescriptions prior to admission  Medication Sig Dispense Refill Last Dose  . ALPRAZolam (XANAX) 0.5 MG tablet Take 0.5 mg by mouth at bedtime as needed.     Taking  . aspirin EC 81 MG tablet Take 81 mg by mouth daily.   Taking  . benzonatate (TESSALON) 100 MG capsule Take 100 mg by mouth daily as needed for cough.   Taking  . calcitRIOL (ROCALTROL) 0.25 MCG capsule Take 1 capsule by mouth 3 (three) times a week.   Taking  . clopidogrel (PLAVIX) 75 MG tablet TAKE 1 TABLET BY MOUTH EVERY DAY 30 tablet 6 Taking  . famotidine (PEPCID) 20 MG tablet Take 20 mg by mouth daily.   Taking  . ferrous sulfate 325 (65 FE) MG tablet Take 325 mg by mouth daily.   Taking  . Flax Oil-Fish Oil-Borage Oil  (FISH OIL-FLAX OIL-BORAGE OIL) CAPS Take 2 capsules by mouth daily.   Taking  . gabapentin (NEURONTIN) 100 MG capsule Take 100 mg by mouth as needed.   Taking  . glucose blood (ACCU-CHEK AVIVA) test strip Use to check blood glucose 4 times a day. E11.65 150 each 5   . insulin aspart (NOVOLOG FLEXPEN) 100 UNIT/ML FlexPen Inject 16 Units into the skin 3 (three) times daily with meals. 15 mL 2   . Insulin Degludec (TRESIBA FLEXTOUCH) 100 UNIT/ML SOPN Inject 70 Units into the skin at bedtime.   Taking  . insulin lispro (HUMALOG KWIKPEN) 100 UNIT/ML KiwkPen Inject 0.15-0.21 mLs (15-21 Units total) into the skin 3 (three) times daily. 15 mL 2   . latanoprost (XALATAN) 0.005 % ophthalmic solution Place 1 drop into both eyes at bedtime. BOTH EYES 1 DROP AT BEDTIME   Taking  . meclizine (ANTIVERT) 12.5 MG tablet Take 12.5 mg by mouth 2 (two) times daily as needed.    Taking  . metoprolol tartrate (LOPRESSOR) 25 MG tablet TAKE ONE TABLET BY MOUTH TWICE DAILY 60 tablet 6 Taking  . Olmesartan-Amlodipine-HCTZ (TRIBENZOR) 40-10-25 MG TABS Take 1 tablet by mouth daily. 30 tablet  Taking  . rosuvastatin (CRESTOR) 20 MG tablet Take 20 mg by mouth at bedtime.    Taking  . torsemide (DEMADEX) 20 MG tablet Take 1 tablet by mouth 2 (two) times daily.   Taking  . trimethoprim (TRIMPEX) 100 MG tablet 1 TAB DAILY   Taking   Family HX:    Family History  Problem Relation Age of Onset  . Diabetes Father   . Hypertension Father   . Stroke Father   . Hypertension Brother   . Aneurysm Brother   . Diabetes Brother   . Colon cancer Neg Hx    Social HX:    Social History   Social History  . Marital Status: Married    Spouse Name: N/A  . Number of Children: 3  . Years of Education: N/A   Occupational History  . Not on file.   Social History Main Topics  . Smoking status: Never Smoker   . Smokeless tobacco: Never Used  . Alcohol Use: No  . Drug Use: No  . Sexual Activity: Not on file   Other Topics Concern   . Not on file   Social History Narrative  ROS:  All 11 ROS were addressed and are negative except what is stated in the HPI  PHYSICAL EXAM Filed Vitals:   07/04/15 1100 07/04/15 1200  BP: 142/75 141/65  Pulse: 89 94  Temp:  97.7 F (36.5 C)  Resp: 17 23   General: Well developed, well nourished, in no acute distress Head: Eyes PERRLA, No xanthomas.   Normal cephalic and atramatic  Lungs:   Clear bilaterally to auscultation and percussion. Heart:   HRRR S1 S2 Pulses are 2+ & equal.            No carotid bruit. No JVD.  No abdominal bruits. No femoral bruits. Abdomen: Bowel sounds are positive, abdomen soft and non-tender without masses Extremities:   No clubbing, cyanosis or edema.  DP +1 Neuro: Alert and oriented X 3. Psych:  Good affect, responds appropriately   Labs:   Lab Results  Component Value Date   WBC 5.1 09/23/2013   HGB 12.2 09/23/2013   HCT 36.9 09/23/2013   MCV 99.2 09/23/2013   PLT 226 09/23/2013   No results for input(s): NA, K, CL, CO2, BUN, CREATININE, CALCIUM, PROT, BILITOT, ALKPHOS, ALT, AST, GLUCOSE in the last 168 hours.  Invalid input(s): LABALBU No results found for: CKTOTAL, CKMB, CKMBINDEX, TROPONINI No results found for: PTT Lab Results  Component Value Date   INR 0.9 RATIO 03/31/2006    No results found for: CHOL No results found for: HDL No results found for: LDLCALC No results found for: TRIG No results found for: CHOLHDL No results found for: LDLDIRECT    Radiology:  No results found.  EKG:  NSR at 85bpm with low voltage QRS and minimal ST depression in precordial leads.    ASSESSMENT/PLAN:   1.  Acute Respiratory Failure secondary to acute CHF.  She is now off BiPAP and on O2 [place] with no SOB. 2.  Acute CHF - unknown whether diastolic or systolic but has had normal LVF in the past.  She has been compliant with her low sodium diet and meds.  She has CKD and appears to have worsening renal function with creatinine now up to 3.   ? Whether this is from acute CHF decompensation or if worsening renal function led to increase volume overload and CHF.  She says that her BS have been poorly controlled recently.  BNP elevated at outside hospital and CXRAY consistent with CHF.  She received IV lasix and is now off BiPAP with O2 sats stable on 2L [place].  She still has crackles at bases.  Will check 2D echo to assess LVF and diastolic function.   Of note D-Dimer elevated at [facility] at 2.86 so need to consider acute PE although doubt this is etiology of respiratory failure since she improved rapidly with IV lasix and BiPAP.  Cannot do CT due to acute on CKD.  Will get VQ scan once CHF improves.  Will keep on IV Heparin for now.  Will start Lasix 40mg  IV BID and follow renal function closely.  Hopefully renal function will improve with diuresis. 3.  Elevated troponin - this is up to 2 but still could be due to demand ischemia in the setting of volume overload with CHF,  hypoxia and acute RV strain and worsening renal function with decreased clearance.  She does have known CAD and last cath in 2008 with PCI of the mid LCX x 2 with overlapping Taxus stents with diffuse disease elsewhere at that time with 40%prox/70% mid/80% LAD and  80% D1 so could have progression of CAD with ischemic leading to acute LV dysfunction and CHF.  Will cycle troponin to determine trend.  Will get 2D echo to assess for RWMAs. 4.  Acute on CKD stage III 5.  ASCAD  With PCI of the mid LCX x 2 with overlapping Taxus stents in 2008 with diffuse disease elsewhere at that time with 40%prox/70% mid/80% LAD and 80% D1.  Continue ASA/Plavix/BB/statin 5.  DM - I have asked [facility] to admit patient due to multiple noncardiac problems and poorly controlled.  Last HbA1C was 9 6.  HTN - fairly well controlled on BB/Tribenzor but renal function worsening.  Will stop Tribenzor and continue Amlodipine 10mg  daily.  Will add Hydralazine 10mg  TID for BP control.  Would not increase BB at this  time given acute CHF.   7.  Hyperlipidemia - continue statin 8.  Overactive bladder on chronic Bactrim   The patient is critically ill with multiple organ systems failure and requires high complexity decision making for assessment and support, frequent evaluation and titration of therapies, application of advanced monitoring technologies and extensive interpretation of multiple databases. Critical Care Time devoted to patient care services described in this note  with >50% of time spent in direct patient care.    Sueanne Margarita, MD  07/04/2015  1:37 PM

## 2015-07-04 NOTE — Progress Notes (Signed)
Pt c/o back pain, no prn orders.  Also pt cbg =406.  Md notified.  Right groin level 1 oozing marked and noted.  Educated pt to call and to keep right leg straight.  Will continue to monitor. Saunders Revel T

## 2015-07-04 NOTE — Interval H&P Note (Signed)
Cath Lab Visit (complete for each Cath Lab visit)  Clinical Evaluation Leading to the Procedure:   ACS: Yes.    Non-ACS:    Anginal Classification: CCS IV  Anti-ischemic medical therapy: Maximal Therapy (2 or more classes of medications)  Non-Invasive Test Results: No non-invasive testing performed  Prior CABG: No previous CABG      History and Physical Interval Note:  07/04/2015 7:27 PM  [name]  has presented today for surgery, with the diagnosis of urgent  The various methods of treatment have been discussed with the patient and family. After consideration of risks, benefits and other options for treatment, the patient has consented to  Procedure(s): Left Heart Cath and Coronary Angiography (N/A) as a surgical intervention .  The patient's history has been reviewed, patient examined, no change in status, stable for surgery.  I have reviewed the patient's chart and labs.  Questions were answered to the patient's satisfaction.     KELLY,THOMAS A

## 2015-07-04 NOTE — H&P (Signed)
History and Physical    [name] [id number] DOB: 1946/03/03 DOA: 07/04/2015  Referring MD/NP/PA: Dr. Fransico Him PCP: Maggie Font, MD  Outpatient Specialists: Dr. Lorrene Reid, nephrology Dr. Bronson Ing, cardiology Dr. Vikki Ports, urology  Patient coming from: [facility]  Chief Complaint: Transfer for higher level of care for NSTEMI, acute CHF, and elevated D-Dimer.   HPI: [name] is a 70 y.o. woman with a history of CAD S/P DES to circumflex lesion in 2008 (multivessel disease noted at that time involving the LAD, 1st diagonal; historicaly, she has had a preserved EF), IDDM, HTN, dyslipidemia, and GERD who feels that she was in her baseline state of health until several days prior to presentation.  She reports progressive shortness of breath, worsening cough (which she has had for at least a month), and increased wheezing.  Last night, she became breathless and light-headed, which prompted family to call 911.  No syncope.  She denies significant chest pain, pressure, or tightness.  She says that she does not lay flat at baseline because she has back pain.  She had diaphoresis last night but no nausea or vomiting.  She reports chronic intermittent edema, that has been no worse than normal.  She has noticed "abdominal fullness" but no documented weight gain.  No fever.  She was transported to the outside hospital last night where she was found to have acute respiratory failure with hypoxia and hypercarbia.  Initial ABG 7.24/68/62 on NRB, improved to 7.4/46/107 with BiPAP.  Chest xray showed infiltrates thought to be most consistent with pulmonary edema.  She has a BNP of 4862.  Troponin 2.59.  Elevated D-Dimer to 2.86.  Glucose 337 with normal bicarb and potassium levels.  BUN 79, Creatinine 2.94 (last 2.3, earlier this month; has been 1.7 - 2.8 in this system over the past two years).  EKG showed ST depressions in leads V3 and V4, more subtle in III, aVF.  The  patient was transferred here for higher level of care.  At the time of my exam, she has been weaned to nasal canula and is in no acute distress.  She is on IV heparin for positive troponin.  Nitropaste in place.  Orders for diuresis given.  She has eaten.  Review of Systems: She has actually had some recent hypoglycemic episodes, which she thinks may be related to taking her home insulins, then not eating enough.  She is on trimethoprim for UTI prophylaxis.  She had course of antibiotics one month ago for lower extremity cellulitis.  Otherwise, 10 systems reviewed and negative except as stated in the HPI.  Of note, she was last seen in cardiology clinic in January.  She is was doing well at that time.  The last a heart cath was considered was in 2015, and it was never done due to renal insufficiency.  Past Medical History  Diagnosis Date  . Hypertension   . Hyperlipidemia   . Diabetes mellitus   . Obesity   . GERD (gastroesophageal reflux disease)   . Heart murmur, systolic   . Anxiety   . Constipation   . Overactive bladder   . Allergic rhinitis   . Low back pain   . Osteoarthritis   . History of hysterectomy   . Acute CHF (congestive heart failure) ([place]) 07/04/2015  . Acute renal failure superimposed on stage 4 chronic kidney disease ([place]) 07/04/2015    Past Surgical History  Procedure Laterality Date  . Carpal tunnel release    .  Trigger finger release    . Cervical biopsy      cervical lymph node biopsies  . Back surgery      multiple  . Coronary stent placement  04/11/2006    2 -- Taxus stents to the circumflex   . Cardiac catheterization  04/04/2006    Est EF of 60%  . Abdominal hysterectomy    . Tendonitis      bilateral elbow  . Colonoscopy  May 2002    Dr. Irving Shows :Followup in 5 years, normal exam  . Colonoscopy  2008    Dr. Laural Golden: Very redundant colon with mild melanosis coli, splenic flexure polyp biopsy with acute complaint of benign colon polyp. Recommended  ten-year followup     reports that she has never smoked. She has never used smokeless tobacco. She reports that she does not drink alcohol or use illicit drugs.  Allergies  Allergen Reactions  . Ace Inhibitors Cough    Pt can tolerate Tribenzor (and ARBs)  . Codeine Rash    Family History  Problem Relation Age of Onset  . Diabetes Father   . Hypertension Father   . Stroke Father   . Hypertension Brother   . Aneurysm Brother   . Diabetes Brother   . Colon cancer Neg Hx    Prior to Admission medications   Medication Sig Start Date End Date Taking? Authorizing Provider  ALPRAZolam Duanne Moron) 0.5 MG tablet Take 0.5 mg by mouth at bedtime as needed.     Yes Historical Provider, MD  aspirin EC 81 MG tablet Take 81 mg by mouth daily.   Yes Historical Provider, MD  benzonatate (TESSALON) 100 MG capsule Take 100 mg by mouth daily as needed for cough.   Yes Historical Provider, MD  calcitRIOL (ROCALTROL) 0.25 MCG capsule Take 1 capsule by mouth 3 (three) times a week. 03/23/15  Yes Historical Provider, MD  clopidogrel (PLAVIX) 75 MG tablet TAKE 1 TABLET BY MOUTH EVERY DAY 12/08/14  Yes Herminio Commons, MD  famotidine (PEPCID) 20 MG tablet Take 20 mg by mouth daily.   Yes Historical Provider, MD  ferrous sulfate 325 (65 FE) MG tablet Take 325 mg by mouth daily.   Yes Historical Provider, MD  Flax Oil-Fish Oil-Borage Oil (FISH OIL-FLAX OIL-BORAGE OIL) CAPS Take 2 capsules by mouth daily.   Yes Historical Provider, MD  gabapentin (NEURONTIN) 100 MG capsule Take 100 mg by mouth as needed.   Yes Historical Provider, MD  glucose blood (ACCU-CHEK AVIVA) test strip Use to check blood glucose 4 times a day. E11.65 07/02/15  Yes Cassandria Anger, MD  Insulin Degludec (TRESIBA FLEXTOUCH) 100 UNIT/ML SOPN Inject 70 Units into the skin at bedtime.   Yes Historical Provider, MD  insulin lispro (HUMALOG KWIKPEN) 100 UNIT/ML KiwkPen Inject 0.15-0.21 mLs (15-21 Units total) into the skin 3 (three) times  daily. 06/22/15  Yes Cassandria Anger, MD  latanoprost (XALATAN) 0.005 % ophthalmic solution Place 1 drop into both eyes at bedtime. BOTH EYES 1 DROP AT BEDTIME 10/25/11  Yes Historical Provider, MD  meclizine (ANTIVERT) 12.5 MG tablet Take 12.5 mg by mouth 2 (two) times daily as needed.  11/14/11  Yes Historical Provider, MD  metoprolol tartrate (LOPRESSOR) 25 MG tablet TAKE ONE TABLET BY MOUTH TWICE DAILY 12/08/14  Yes Herminio Commons, MD  Olmesartan-Amlodipine-HCTZ (TRIBENZOR) 40-10-25 MG TABS Take 1 tablet by mouth daily. 01/20/12  Yes Renella Cunas, MD  rosuvastatin (CRESTOR) 20 MG tablet Take 20 mg  by mouth at bedtime.    Yes Historical Provider, MD  torsemide (DEMADEX) 20 MG tablet Take 1 tablet by mouth 2 (two) times daily. 02/10/15  Yes Historical Provider, MD  trimethoprim (TRIMPEX) 100 MG tablet 1 TAB DAILY 12/06/11  Yes Historical Provider, MD    Physical Exam: Filed Vitals:   07/04/15 1030 07/04/15 1045 07/04/15 1100 07/04/15 1200  BP: 139/75 127/63 142/75 141/65  Pulse: 89 87 89 94  Temp:    97.7 F (36.5 C)  TempSrc:    Oral  Resp: 15 12 17 23   Height:      Weight:      SpO2: 100% 100% 100% 96%   Constitutional: NAD, calm, comfortable Filed Vitals:   07/04/15 1030 07/04/15 1045 07/04/15 1100 07/04/15 1200  BP: 139/75 127/63 142/75 141/65  Pulse: 89 87 89 94  Temp:    97.7 F (36.5 C)  TempSrc:    Oral  Resp: 15 12 17 23   Height:      Weight:      SpO2: 100% 100% 100% 96%   Eyes: PERRL, lids and conjunctivae normal ENMT: Mucous membranes are moist. Posterior pharynx clear of any exudate or lesions.Normal dentition.  Neck: normal, supple, no masses Respiratory:  Normal respiratory effort. No accessory muscle use. Diminished bilaterally, no significant wheezing now. Cardiovascular: Regular rate and rhythm.  Trace pretibial edema.  2+ pedal pulses. Abdomen: no tenderness, no masses palpated.  No guarding or rebound.  Bowel sounds are present.    Musculoskeletal:  No joint deformity upper and lower extremities. Good ROM, no contractures. Normal muscle tone.  Skin: Pretibial ulcerations/excoriations to left leg.  No rashes.  No induration Neurologic: CN 2-12 grossly intact. Sensation intact, Strength 5/5 in all 4.  Psychiatric: Normal judgment and insight. Alert and oriented x 3. Normal mood.   Labs on Admission: I have personally reviewed following labs and imaging studies  CBC:  Recent Labs Lab 07/04/15 1456  WBC 7.8  NEUTROABS 6.3  HGB 11.0*  HCT 35.6*  MCV 102.0*  PLT 171   CBG:  Recent Labs Lab 07/04/15 1126  GLUCAP 387*   Urine analysis:    Component Value Date/Time   COLORURINE YELLOW 08/23/2007 2037   APPEARANCEUR CLEAR 08/23/2007 2037   LABSPEC <1.005* 08/23/2007 2037   PHURINE 5.5 08/23/2007 2037   GLUCOSEU >1000* 08/23/2007 2037   HGBUR TRACE* 08/23/2007 2037   BILIRUBINUR NEGATIVE 08/23/2007 2037   KETONESUR TRACE* 08/23/2007 2037   PROTEINUR TRACE* 08/23/2007 2037   UROBILINOGEN 0.2 08/23/2007 2037   NITRITE NEGATIVE 08/23/2007 2037   LEUKOCYTESUR NEGATIVE 08/23/2007 2037    Recent Results (from the past 240 hour(s))  MRSA PCR Screening     Status: None   Collection Time: 07/04/15  9:50 AM  Result Value Ref Range Status   MRSA by PCR NEGATIVE NEGATIVE Final    Comment:        The GeneXpert MRSA Assay (FDA approved for NASAL specimens only), is one component of a comprehensive MRSA colonization surveillance program. It is not intended to diagnose MRSA infection nor to guide or monitor treatment for MRSA infections.     EKG: Independently reviewed. Repeat EKG here shows that ST depressions have resolved.  Nonspecific ST/T wave changes present.  NSR.  Assessment/Plan Active Problems:   Hyperlipemia   Essential hypertension   CORONARY ATHEROSCLEROSIS NATIVE CORONARY ARTERY   Type 2 diabetes mellitus with stage 4 chronic kidney disease (HCC)   Acute CHF (congestive heart failure) ([place])  Acute renal  failure superimposed on stage 4 chronic kidney disease (HCC)   NSTEMI (non-ST elevated myocardial infarction) (HCC)   Elevated d-dimer  Admit to stepdown, can likely transfer to cardiac telemetry promptly if her respiratory status holds up  Acute respiratory failure secondary to Acute CHF, type unknown, echo pending; and NSTEMI.  Positive D-Dimer noted but pulmonary embolus is lower on the differential at this time and renal function/baseline abnormalities on chest xray prevent further work-up at this time.  The patient is on empiric anticoagulation. --Cardioloy assistance with management greatly appreciated.  Orders already given for diuresis.  Agree with IV heparin per protocol and nitropaste q6h for now. --Serial troponin --Echo pending --Strict I/O (patient already has foley catheter) --Daily weights --Continue ASA, plavix, BB, ARB, statin --Consider V/Q scan when hemodynamics/clinical status improves  AKI on CKD 4 --Follow BUN/Cr with diuresis.  Based on trend; may need to call Dr. Lorrene Reid or covering partner.  IDDM with recent hypoglycemia.  I am sure her requirements are fluctuating with acute cardiac illness, renal insufficiency, and changes in PO intake.  May ultimately need adjustments to home regimen. --Continue Tyler Aas (or equivalent) for now --SSI coverage AC/HS --No evidence of DKA  DVT prophylaxis: On IV heparin Code Status: FULL Family Communication: Patient alone at time of admission Disposition Plan: To be determined based on Cardiology recommendations and renal function Consults called: Cardiology following Admission status: Inpatient, stepdown for now   Eber Jones MD [facility]  07/04/2015, 3:25 PM

## 2015-07-04 NOTE — Progress Notes (Signed)
Troponin 17.3.  Dr. Radford Pax notified.  Will continue to monitor. Winifred, Ardeth Sportsman

## 2015-07-04 NOTE — H&P (View-Only) (Signed)
CTSP after she had acute onset of soaking diaphoresis , SOB and sinus tachycardia.  EKG shows no ST elevation but diffuse mild ST depression.  Exam shows crackles 1/3 way up bilaterally. Troponin now up to 17.  Suspect this is flash pulmonary edema from underlying ischemia.  Discussed case with Dr. Claiborne Billings who is on for interventional cardiology as well as Dr. Marval Regal from Ren[facility].  Patient having active ischemia as noted by increasing troponin and recurrent flash pulmonary edema.  Given Lasix 80mg  IV.  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  Options limited given her ongoing coronary ischemia.  Risks have been explained to the patient.  I feel at this time patient needs to go to the cath lab.  Cardiac catheterization was discussed with the patient fully. The patient understands that risks include but are not limited to stroke (1 in 1000), death (1 in 50), kidney failure [usually temporary but given underlying AKD she is at much higher risk] (1 in 100), bleeding (1 in 200), allergic reaction [possibly serious] (1 in 200).  The patient understands and is willing to proceed.

## 2015-07-04 NOTE — Progress Notes (Signed)
ANTICOAGULATION CONSULT NOTE - Initial Consult  Pharmacy Consult for heparin Indication: chest pain/ACS  Allergies  Allergen Reactions  . Ace Inhibitors Cough    Pt can tolerate Tribenzor (and ARBs)  . Codeine Rash    Patient Measurements: Height: 5\' 7"  (170.2 cm) Weight: 229 lb 0.9 oz (103.9 kg) IBW/kg (Calculated) : 61.6 Heparin Dosing Weight: 85.1  Vital Signs: Temp: 97.7 F (36.5 C) (04/29 1200) Temp Source: Oral (04/29 1200) BP: 141/65 mmHg (04/29 1200) Pulse Rate: 94 (04/29 1200)  Labs: No results for input(s): HGB, HCT, PLT, APTT, LABPROT, INR, HEPARINUNFRC, HEPRLOWMOCWT, CREATININE, CKTOTAL, CKMB, TROPONINI in the last 72 hours.  Estimated Creatinine Clearance: 27.4 mL/min (by C-G formula based on Cr of 2.37).   Medical History: Past Medical History  Diagnosis Date  . Hypertension   . Hyperlipidemia   . Diabetes mellitus   . Obesity   . GERD (gastroesophageal reflux disease)   . Heart murmur, systolic   . Anxiety   . Constipation   . Overactive bladder   . Allergic rhinitis   . Low back pain   . Osteoarthritis   . History of hysterectomy   . Acute CHF (congestive heart failure) ([place]) 07/04/2015  . Acute renal failure superimposed on stage 4 chronic kidney disease ([place]) 07/04/2015    Medications:  Scheduled:  . antiseptic oral rinse  7 mL Mouth Rinse BID  . furosemide  80 mg Intravenous BID  . heparin  4,000 Units Intravenous Once    Assessment: 70 yo AAF with history of ASCAD with PCI of the mid LCX x 2 in 2008 and diffuse disease elsewhere at that time. Now presents with SOB and elevated troponin. Currently cycling troponins. CBC stable.  Goal of Therapy:  Heparin level 0.3-0.7 units/ml Monitor platelets by anticoagulation protocol: Yes   Plan:  Give 4000 units bolus x 1 Start heparin infusion at 1100 units/hr Check anti-Xa level in 8 hours and daily while on heparin Continue to monitor H&H and platelets   Melburn Popper,  PharmD Clinical Pharmacy Resident Pager: [phone] 07/04/2015 2:45 PM

## 2015-07-04 NOTE — Consult Note (Signed)
Reason for Consult: AKI/CKD Referring Physician: Eulas Post, MD  [name] is an 70 y.o. female.  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  She was found to be in acute respiratory distress and placed on BiPAP.  She was also noted to have elevated BNP, D-dimer, and have hypercarbic, hypoxic respiratory acidosis.  CXR revealed pulmonary edema c/w CHF and was transferred to [facility] CCU  today for further evaluation.  She has a known history of diffuse CAD and is s/p 2 stents in LCx.  She was also noted to have an elevated Scr of 2.94 (baseline has been climbing over the last year from 2.1-2.7).  We were asked to see the patient due to her AKI/CKD as well as the possible need for cardiac cath since her troponin increased to 17.3 and had EKG abnormalities.    Trend in Creatinine:  CREATININE, SER  Date/Time Value Ref Range Status  07/04/2015 02:56 PM 2.97* 0.44 - 1.00 mg/dL Final  06/23/2015 10:04 AM 2.37* 0.57 - 1.00 mg/dL Final  03/10/2015 09:24 AM 2.86* 0.57 - 1.00 mg/dL Final  10/08/2013 10:24 AM 1.73* 0.50 - 1.10 mg/dL Final  09/23/2013 11:24 AM 2.23* 0.50 - 1.10 mg/dL Final  08/23/2007 09:15 PM 1.58*  Final  03/31/2006 10:59 AM 1.0 0.4-1.2 mg/dL Final    PMH:   Past Medical History  Diagnosis Date  . Hypertension   . Hyperlipidemia   . Diabetes mellitus   . Obesity   . GERD (gastroesophageal reflux disease)   . Heart murmur, systolic   . Anxiety   . Constipation   . Overactive bladder   . Allergic rhinitis   . Low back pain   . Osteoarthritis   . History of hysterectomy   . Acute CHF (congestive heart failure) ([place]) 07/04/2015  . Acute renal failure superimposed on stage 4 chronic kidney disease ([place]) 07/04/2015    PSH:   Past Surgical History  Procedure  Laterality Date  . Carpal tunnel release    . Trigger finger release    . Cervical biopsy      cervical lymph node biopsies  . Back surgery      multiple  . Coronary stent placement  04/11/2006    2 -- Taxus stents to the circumflex   . Cardiac catheterization  04/04/2006    Est EF of 60%  . Abdominal hysterectomy    . Tendonitis      bilateral elbow  . Colonoscopy  May 2002    Dr. Irving Shows :Followup in 5 years, normal exam  . Colonoscopy  2008    Dr. Laural Golden: Very redundant colon with mild melanosis coli, splenic flexure polyp biopsy with acute complaint of benign colon polyp. Recommended ten-year followup    Allergies:  Allergies  Allergen Reactions  . Ace Inhibitors Cough    Pt can tolerate Tribenzor (and ARBs)  . Codeine Rash    Medications:   Prior to Admission medications   Medication Sig Start Date End Date Taking? Authorizing Provider  acetaminophen (TYLENOL) 500 MG tablet Take 2,000 mg by mouth every 6 (six) hours as needed (pain).   Yes Historical Provider, MD  ALPRAZolam Duanne Moron) 0.5 MG tablet Take 0.5 mg by mouth at bedtime as needed for sleep.    Yes Historical Provider,  MD  aspirin EC 81 MG tablet Take 81 mg by mouth daily.   Yes Historical Provider, MD  benzonatate (TESSALON) 100 MG capsule Take 100 mg by mouth daily as needed for cough.   Yes Historical Provider, MD  calcitRIOL (ROCALTROL) 0.25 MCG capsule Take 0.25 mcg by mouth every Monday, Wednesday, and Friday.  03/23/15  Yes Historical Provider, MD  clopidogrel (PLAVIX) 75 MG tablet TAKE 1 TABLET BY MOUTH EVERY DAY 12/08/14  Yes Herminio Commons, MD  famotidine (PEPCID) 20 MG tablet Take 20 mg by mouth daily.   Yes Historical Provider, MD  ferrous sulfate 325 (65 FE) MG tablet Take 325 mg by mouth daily.   Yes Historical Provider, MD  Flax Oil-Fish Oil-Borage Oil (FISH OIL-FLAX OIL-BORAGE OIL) CAPS Take 2 capsules by mouth daily.   Yes Historical Provider, MD  gabapentin (NEURONTIN) 100 MG capsule Take  100 mg by mouth daily as needed (pain).    Yes Historical Provider, MD  Insulin Degludec (TRESIBA FLEXTOUCH) 100 UNIT/ML SOPN Inject 70 Units into the skin at bedtime.   Yes Historical Provider, MD  insulin lispro (HUMALOG KWIKPEN) 100 UNIT/ML KiwkPen Inject 0.15-0.21 mLs (15-21 Units total) into the skin 3 (three) times daily. Patient taking differently: Inject 15-21 Units into the skin 3 (three) times daily with meals. Per sliding scale 06/22/15  Yes Cassandria Anger, MD  latanoprost (XALATAN) 0.005 % ophthalmic solution Place 1 drop into both eyes at bedtime.  10/25/11  Yes Historical Provider, MD  meclizine (ANTIVERT) 12.5 MG tablet Take 12.5 mg by mouth 2 (two) times daily.  11/14/11  Yes Historical Provider, MD  metoprolol tartrate (LOPRESSOR) 25 MG tablet TAKE ONE TABLET BY MOUTH TWICE DAILY 12/08/14  Yes Herminio Commons, MD  Multiple Vitamin (MULTIVITAMIN WITH MINERALS) TABS tablet Take 1 tablet by mouth daily.   Yes Historical Provider, MD  Olmesartan-Amlodipine-HCTZ (TRIBENZOR) 40-10-25 MG TABS Take 1 tablet by mouth daily. 01/20/12  Yes Renella Cunas, MD  rosuvastatin (CRESTOR) 20 MG tablet Take 20 mg by mouth at bedtime.    Yes Historical Provider, MD  torsemide (DEMADEX) 20 MG tablet Take 20 mg by mouth 2 (two) times daily.  02/10/15  Yes Historical Provider, MD  trimethoprim (TRIMPEX) 100 MG tablet Take 100 mg by mouth daily.  12/06/11  Yes Historical Provider, MD  glucose blood (ACCU-CHEK AVIVA) test strip Use to check blood glucose 4 times a day. E11.65 07/02/15   Cassandria Anger, MD    Inpatient medications: . amLODipine  5 mg Oral Daily  . aspirin EC  81 mg Oral Daily  . [START ON 07/06/2015] calcitRIOL  0.25 mcg Oral Once per day on Mon Wed Fri  . clopidogrel  75 mg Oral Daily  . famotidine  20 mg Oral Daily  . ferrous sulfate  325 mg Oral Daily  . furosemide  80 mg Intravenous BID  . hydrALAZINE  10 mg Oral Q8H  . insulin aspart  0-20 Units Subcutaneous TID WC  .  insulin detemir  15 Units Subcutaneous BID  . latanoprost  1 drop Both Eyes QHS  . metoprolol tartrate  25 mg Oral BID  . nitroGLYCERIN  0.5 inch Topical Q6H  . rosuvastatin  20 mg Oral QHS    Discontinued Meds:   Medications Discontinued During This Encounter  Medication Reason  . insulin aspart (NOVOLOG FLEXPEN) 100 UNIT/ML FlexPen   . antiseptic oral rinse (CPC / CETYLPYRIDINIUM CHLORIDE 0.05%) solution 7 mL   . furosemide (LASIX)  injection 80 mg   . Insulin Degludec SOPN 35 Units   . insulin aspart (novoLOG) injection 0-15 Units     Social History:  reports that she has never smoked. She has never used smokeless tobacco. She reports that she does not drink alcohol or use illicit drugs.  Family History:   Family History  Problem Relation Age of Onset  . Diabetes Father   . Hypertension Father   . Stroke Father   . Hypertension Brother   . Aneurysm Brother   . Diabetes Brother   . Colon cancer Neg Hx     Pertinent items are noted in HPI. Weight change:   Intake/Output Summary (Last 24 hours) at 07/04/15 1651 Last data filed at 07/04/15 1600  Gross per 24 hour  Intake  125.5 ml  Output   1625 ml  Net -1499.5 ml   BP 153/80 mmHg  Pulse 109  Temp(Src) 97.7 F (36.5 C) (Oral)  Resp 27  Ht 5\' 7"  (1.702 m)  Wt 103.9 kg (229 lb 0.9 oz)  BMI 35.87 kg/m2  SpO2 98% Filed Vitals:   07/04/15 1200 07/04/15 1400 07/04/15 1500 07/04/15 1600  BP: 141/65 147/74 153/80   Pulse: 94   109  Temp: 97.7 F (36.5 C)     TempSrc: Oral     Resp: 23 21 23 27   Height:      Weight:      SpO2: 96%   98%     General appearance: fatigued, mild distress and sitting upright on edge of bed Head: Normocephalic, without obvious abnormality, atraumatic Eyes: negative findings: lids and lashes normal, conjunctivae and sclerae normal and corneas clear Neck: no adenopathy, no carotid bruit, supple, symmetrical, trachea midline and thyroid not enlarged, symmetric, no  tenderness/mass/nodules Resp: decreased air movement with scattered expiratory wheezes and bibasilar crackles Cardio: regular rate and rhythm, no rub and I/VI SEM at LUSB GI: soft, non-tender; bowel sounds normal; no masses,  no organomegaly Extremities: edema trace presacral and minimal pretibial  Labs: Basic Metabolic Panel:  Recent Labs Lab 07/04/15 1456  NA 136  K 4.3  CL 94*  CO2 26  GLUCOSE 496*  BUN 78*  CREATININE 2.97*  ALBUMIN 3.9  CALCIUM 9.8   Liver Function Tests:  Recent Labs Lab 07/04/15 1456  AST 107*  ALT 42  ALKPHOS 71  BILITOT 0.6  PROT 7.9  ALBUMIN 3.9   No results for input(s): LIPASE, AMYLASE in the last 168 hours. No results for input(s): AMMONIA in the last 168 hours. CBC:  Recent Labs Lab 07/04/15 1456  WBC 7.8  NEUTROABS 6.3  HGB 11.0*  HCT 35.6*  MCV 102.0*  PLT 171   PT/INR: @LABRCNTIP (inr:5) Cardiac Enzymes: ) Recent Labs Lab 07/04/15 1456  TROPONINI 17.33*   CBG:  Recent Labs Lab 07/04/15 1126  GLUCAP 387*    Iron Studies: No results for input(s): IRON, TIBC, TRANSFERRIN, FERRITIN in the last 168 hours.  Xrays/Other Studies: No results found.   Assessment/Plan: 1.  AKI/CKD in setting of acute respiratory distress and likely flash pulmonary edema.  She was diuresed 1.5l but now may require cardiac catheterization for cardiac ischemia/injury.  Unfortunately she cannot receive IV hydration prior to procedure due to her current pulmonary edema and respiratory distress.  I had a lengthy discussion with [name] and her daughter regarding the risks of AKI related to contrast and the potential need for hemodialysis if her renal function and volume status were to worsen.  We also  discussed the risks of not treating ongoing cardiac ischemia.  Her ARB was held on admission and would limit contrast used for cardiac cath if required.  Will continue to follow closely, however there are no other effective renoprotective  therapies to offer. 2. Elevated troponins- with known diffuse CAD and ECG changes.  Cardiology to re-evaluate repeat EKG and decide upon cardiac cath 3. Flash pulmonary edema/CHF- likely due to CAD and acute event.  Diuresed well.  Off BiPAP.  Plan per cardiology. 4. HTN- off benicar (tribenzor) on amlodipine, hydralazine, ntg.  Beta-blocker per cardiology. 5. DM- poorly controlled, changes in insulin per primary svc 6. CKD stage 4- shehas had progressive CKD due to DM/HTN but has declined any education on dialysis and has declined referral to vascular surgery as well.  We did discuss the possible need for HD and she was willing to proceed if needed. 7. Anemia of chronic disease- will check iron stores and follow H/H.  She may need to start ESA.     [name] 07/04/2015, 4:51 PM

## 2015-07-04 NOTE — Progress Notes (Addendum)
CTSP after she had acute onset of soaking diaphoresis , SOB and sinus tachycardia.  EKG shows no ST elevation but diffuse mild ST depression.  Exam shows crackles 1/3 way up bilaterally. Troponin now up to 17.  Suspect this is flash pulmonary edema from underlying ischemia.  Discussed case with Dr. Claiborne Billings who is on for interventional cardiology as well as Dr. Marval Regal from Renal.  Patient having active ischemia as noted by increasing troponin and recurrent flash pulmonary edema.  Given Lasix 80mg  IV.  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  Options limited given her ongoing coronary ischemia.  Risks have been explained to the patient.  I feel at this time patient needs to go to the cath lab.  Cardiac catheterization was discussed with the patient fully. The patient understands that risks include but are not limited to stroke (1 in 1000), death (1 in 82), kidney failure [usually temporary but given underlying AKD she is at much higher risk] (1 in 100), bleeding (1 in 200), allergic reaction [possibly serious] (1 in 200).  The patient understands and is willing to proceed.    >35 minutes was spent in direct patient care developing assessment and plan, discussing with patient and Dr. Claiborne Billings the interventional cardiologist on call

## 2015-07-05 ENCOUNTER — Inpatient Hospital Stay (HOSPITAL_COMMUNITY): Payer: BLUE CROSS/BLUE SHIELD

## 2015-07-05 DIAGNOSIS — I25119 Atherosclerotic heart disease of native coronary artery with unspecified angina pectoris: Secondary | ICD-10-CM

## 2015-07-05 LAB — BASIC METABOLIC PANEL
Anion gap: 13 (ref 5–15)
BUN: 78 mg/dL — AB (ref 6–20)
CHLORIDE: 97 mmol/L — AB (ref 101–111)
CO2: 28 mmol/L (ref 22–32)
Calcium: 9.3 mg/dL (ref 8.9–10.3)
Creatinine, Ser: 2.55 mg/dL — ABNORMAL HIGH (ref 0.44–1.00)
GFR calc Af Amer: 21 mL/min — ABNORMAL LOW (ref >60)
GFR calc non Af Amer: 18 mL/min — ABNORMAL LOW (ref >60)
Glucose, Bld: 303 mg/dL — ABNORMAL HIGH (ref 65–99)
POTASSIUM: 3.7 mmol/L (ref 3.5–5.1)
Sodium: 138 mmol/L (ref 135–145)

## 2015-07-05 LAB — GLUCOSE, CAPILLARY
GLUCOSE-CAPILLARY: 187 mg/dL — AB (ref 65–99)
GLUCOSE-CAPILLARY: 322 mg/dL — AB (ref 65–99)
Glucose-Capillary: 235 mg/dL — ABNORMAL HIGH (ref 65–99)
Glucose-Capillary: 236 mg/dL — ABNORMAL HIGH (ref 65–99)

## 2015-07-05 LAB — CBC
HEMATOCRIT: 30.6 % — AB (ref 36.0–46.0)
Hemoglobin: 9.8 g/dL — ABNORMAL LOW (ref 12.0–15.0)
MCH: 31.9 pg (ref 26.0–34.0)
MCHC: 32 g/dL (ref 30.0–36.0)
MCV: 99.7 fL (ref 78.0–100.0)
Platelets: 184 10*3/uL (ref 150–400)
RBC: 3.07 MIL/uL — AB (ref 3.87–5.11)
RDW: 14.3 % (ref 11.5–15.5)
WBC: 6.6 10*3/uL (ref 4.0–10.5)

## 2015-07-05 LAB — POCT ACTIVATED CLOTTING TIME
ACTIVATED CLOTTING TIME: 162 s
Activated Clotting Time: 188 seconds
Activated Clotting Time: 183 seconds

## 2015-07-05 LAB — TROPONIN I: TROPONIN I: 24.26 ng/mL — AB (ref <0.031)

## 2015-07-05 LAB — PLATELET COUNT: PLATELETS: 176 10*3/uL (ref 150–400)

## 2015-07-05 MED ORDER — INSULIN GLARGINE 100 UNIT/ML ~~LOC~~ SOLN
40.0000 [IU] | Freq: Every day | SUBCUTANEOUS | Status: DC
  Administered 2015-07-05 – 2015-07-06 (×2): 40 [IU] via SUBCUTANEOUS
  Filled 2015-07-05 (×4): qty 0.4

## 2015-07-05 MED ORDER — METOPROLOL TARTRATE 50 MG PO TABS
50.0000 mg | ORAL_TABLET | Freq: Two times a day (BID) | ORAL | Status: DC
  Administered 2015-07-05 – 2015-07-12 (×12): 50 mg via ORAL
  Filled 2015-07-05 (×12): qty 1

## 2015-07-05 MED ORDER — ISOSORBIDE MONONITRATE ER 30 MG PO TB24
30.0000 mg | ORAL_TABLET | Freq: Every day | ORAL | Status: DC
  Administered 2015-07-05 – 2015-07-07 (×3): 30 mg via ORAL
  Filled 2015-07-05 (×3): qty 1

## 2015-07-05 MED ORDER — SODIUM CHLORIDE 0.9 % IV SOLN: INTRAVENOUS | Status: DC

## 2015-07-05 MED ORDER — ATROPINE SULFATE 1 MG/10ML IJ SOSY
PREFILLED_SYRINGE | INTRAMUSCULAR | Status: AC
  Filled 2015-07-05: qty 10

## 2015-07-05 NOTE — Progress Notes (Signed)
Utilization review completed.  

## 2015-07-05 NOTE — Progress Notes (Signed)
[facility] Progress Note  Patient: [name] [id number]   PCP: Maggie Font, MD DOB: 01-02-1946   DOA: 07/04/2015   DOS: 07/05/2015   Date of Service: the patient was seen and examined on 07/05/2015 Outpatient Specialists: Cardiology Dr Bronson Ing Gastroenterology Dr. Oneida Alar  Subjective: Patient mentions her breathing is better continues to have some cough. No chest pain no nausea no vomiting. No diarrhea no constipation. No swelling of the legs reported Nutrition: Tolerating oral diet  Brief hospital course: Patient was admitted on 07/04/2015, with complaint of shortness of breath, was found to have flash pulmonary edema as well as non-ST elevation MI. Patient was initially requiring BiPAP for respiratory distress. Cardio was consulted and the patient underwent cardiac catheterization overnight. Nephrology was also consulted for CKD Currently further plan is continue aggressive diuresis and monitor renal function.  Assessment and Plan: 1. NSTEMI (non-ST elevated myocardial infarction) [facility]) Status post left heart catheterization. Mid RCA and posterior PDA stenting. Lifelong dual antiplatelet. Peak troponin 24. Cardiology continues to follow. May require viability study in the LCx territory with a planned staged intervention for chronic total occlusion. Appreciate input from cardiology. On Crestor 20 mg, Lopressor 50 mg, Imdur 30 mg.  2. Chronic kidney disease. Stage IV.Marland Kitchen Acute kidney injury. Patient presented with mild elevation of serum creatinine from her baseline. With her flash pulmonary edema with non-ST elevation MI patient required cardiac catheterization with risk for worsening renal function and requiring dialysis. At present the renal function continues to remain stable and will continue to monitor. Appreciate input from nephrology. Benicar is stopped.  3. 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 Prior echocardiogram showed diastolic  dysfunction. With current non-ST elevation MI possibility of systolic dysfunction cannot be ruled out. Currently on Lasix 80 mg twice a day. Patient was receiving IV fluids after cardiac catheterization which will be stopped.  4. Multifocal airspace disease on chest x-ray. Possibility of pneumonia cannot be ruled out although the patient does not have any leukocytosis fever or chronic cough to suggest pneumonia. We'll monitor clinically.  5. Type 2 diabetes mellitus with CKD. Hemoglobin A1c on April 2017 9.1. Uncontrolled. Currently on resistant sliding scale The patient is on Degludec at home 70 units at bedtime. Although the conversion to Lantus or Levemir is 1-1 given that the patient will be eating less during the hospitalization I would reduce the dose to 40 units of Lantus  6. Anemia. Active bleeding. Chronically on aspirin and Plavix. Currently on aspirin and Proventil. Continue iron supplementation.  Activity: physical therapy CONSULTED Bowel regimen: last BM sennocot prn Diet: cardiac and diabetic diet DVT Prophylaxis: subcutaneous Heparin  Advance goals of care discussion: Full code  Family Communication: family was present at bedside, at the time of interview. The pt provided permission to discuss medical plan with the family. Opportunity was given to ask question and all questions were answered satisfactorily.   Disposition:  Expected discharge date: 07/07/2015 Barriers to safe discharge: Improvement in CHF symptoms as well as renal function  Consultants: Cardiology, nephrology Procedures: Left heart cath.  Antibiotics: Anti-infectives    None        Intake/Output Summary (Last 24 hours) at 07/05/15 1758 Last data filed at 07/05/15 1700  Gross per 24 hour  Intake 2468.43 ml  Output   2250 ml  Net 218.43 ml   Filed Weights   07/04/15 0946 07/05/15 0324  Weight: 103.9 kg (229 lb 0.9 oz) 99.9 kg (220 lb 3.8 oz)    Objective: Physical Exam:  Filed  Vitals:   07/05/15 1415 07/05/15 1430 07/05/15 1500 07/05/15 1632  BP: 128/62 121/61  124/59  Pulse: 72 70 73 74  Temp:    98.6 F (37 C)  TempSrc:    Oral  Resp: 21 18 22 20   Height:      Weight:      SpO2: 100% 98% 97% 100%     General: Appear in mild distress, no Rash; Oral Mucosa moist. Cardiovascular: S1 and S2 Present, no Murmur, positive JVD Respiratory: Bilateral Air entry present and bilateral  Crackles, no wheezes Abdomen: Bowel Sound present, Soft and no tenderness Extremities: no Pedal edema, no calf tenderness Neurology: Grossly no focal neuro deficit.  Data Reviewed: CBC:  Recent Labs Lab 07/04/15 1456 07/05/15 0220  WBC 7.8 6.6  NEUTROABS 6.3  --   HGB 11.0* 9.8*  HCT 35.6* 30.6*  MCV 102.0* 99.7  PLT 171 176  [id number]   Basic Metabolic Panel:  Recent Labs Lab 07/04/15 1456 07/05/15 0220  NA 136 138  K 4.3 3.7  CL 94* 97*  CO2 26 28  GLUCOSE 496* 303*  BUN 78* 78*  CREATININE 2.97* 2.55*  CALCIUM 9.8 9.3    Liver Function Tests:  Recent Labs Lab 07/04/15 1456  AST 107*  ALT 42  ALKPHOS 71  BILITOT 0.6  PROT 7.9  ALBUMIN 3.9   No results for input(s): LIPASE, AMYLASE in the last 168 hours. No results for input(s): AMMONIA in the last 168 hours. Coagulation Profile:  Recent Labs Lab 07/04/15 1456  INR 1.16   Cardiac Enzymes:  Recent Labs Lab 07/04/15 1456 07/04/15 2155 07/05/15 0220  TROPONINI 17.33* 17.98* 24.26*   BNP (last 3 results) No results for input(s): PROBNP in the last 8760 hours.  CBG:  Recent Labs Lab 07/04/15 1651 07/04/15 2141 07/05/15 0846 07/05/15 1201 07/05/15 1631  GLUCAP 504* 406* 187* 235* 322*    Studies: Dg Chest Port 1 View  07/05/2015  CLINICAL DATA:  70 year old female with history of shortness of breath. History of congestive heart failure. EXAM: PORTABLE CHEST 1 VIEW COMPARISON:  Chest x-ray 07/04/2015. FINDINGS: Aeration has improved significantly compared to the prior study,  although there continues to be patchy asymmetrically distributed airspace disease predominantly in the left lung base and throughout the right mid to lower lung. Small bilateral pleural effusions. Minimal cephalization of the pulmonary vasculature. Heart size is normal. Upper mediastinal contours are within normal limits. IMPRESSION: 1. Resolving multifocal airspace disease. This was relatively symmetric in appearance on the prior study, which may indicate resolving pulmonary edema, however, the appearance is far more asymmetric on today's study, and heart size is normal. Clinical correlation for multilobar bronchopneumonia is suggested. 2. Small bilateral pleural effusions. Electronically Signed   By: Vinnie Langton M.D.   On: 07/05/2015 10:44     Scheduled Meds: . amLODipine  5 mg Oral Daily  . aspirin EC  81 mg Oral Daily  . [START ON 07/06/2015] calcitRIOL  0.25 mcg Oral Once per day on Mon Wed Fri  . famotidine  20 mg Oral Daily  . ferrous sulfate  325 mg Oral Daily  . furosemide  80 mg Intravenous BID  . hydrALAZINE  10 mg Oral Q8H  . insulin aspart  0-20 Units Subcutaneous TID WC  . insulin detemir  15 Units Subcutaneous BID  . isosorbide mononitrate  30 mg Oral Daily  . latanoprost  1 drop Both Eyes QHS  . metoprolol tartrate  50 mg Oral  BID  . rosuvastatin  20 mg Oral QHS  . sodium chloride flush  3 mL Intravenous Q12H  . ticagrelor  90 mg Oral BID   Continuous Infusions:  PRN Meds: sodium chloride, acetaminophen, ALPRAZolam, benzonatate, diazepam, ondansetron (ZOFRAN) IV, sodium chloride flush, zolpidem  Time spent: 30 minutes  Author: Berle Mull, MD [facility] Pager: [phone] 07/05/2015 5:58 PM  If 7PM-7AM, please contact night-coverage at [email or web address], password [facility]

## 2015-07-05 NOTE — Progress Notes (Signed)
Pt's ACT = 183.  Old drainage noted to right groin, level 1.  VS stable.  Family at bedside.  Will continue to monitor. Saunders Revel T

## 2015-07-05 NOTE — Progress Notes (Addendum)
Cardiologist: Edward Qualia, MD Subjective:   No CP, no SOB Feels much better. Original presentation with dyspnea.   Objective:  Vital Signs in the last 24 hours: Temp:  [97.7 F (36.5 C)-98.9 F (37.2 C)] 98.9 F (37.2 C) (04/30 0800) Pulse Rate:  [0-109] 89 (04/30 1130) Resp:  [0-66] 20 (04/30 1130) BP: (105-155)/(58-108) 143/66 mmHg (04/30 1130) SpO2:  [0 %-100 %] 100 % (04/30 1130) Arterial Line BP: (107-160)/(41-72) 149/59 mmHg (04/30 0900) Weight:  [220 lb 3.8 oz (99.9 kg)] 220 lb 3.8 oz (99.9 kg) (04/30 0324)  Intake/Output from previous day: 04/29 0701 - 04/30 0700 In: 1230.4 [P.O.:240; I.V.:990.4] Out: 3650 [Urine:3650]   Physical Exam: General: Well developed, well nourished, in no acute distress. Head:  Normocephalic and atraumatic. Lungs: Clear to auscultation and percussion. Heart: Normal S1 and S2.  No murmur, rubs or gallops.  Abdomen: soft, non-tender, positive bowel sounds. Extremities: No clubbing or cyanosis. No edema. Neurologic: Alert and oriented x 3. Skin: cath site c/d/i    Lab Results:  Recent Labs  07/04/15 1456 07/05/15 0220  WBC 7.8 6.6  HGB 11.0* 9.8*  PLT 171 176  184    Recent Labs  07/04/15 1456 07/05/15 0220  NA 136 138  K 4.3 3.7  CL 94* 97*  CO2 26 28  GLUCOSE 496* 303*  BUN 78* 78*  CREATININE 2.97* 2.55*    Recent Labs  07/04/15 2155 07/05/15 0220  TROPONINI 17.98* 24.26*   Hepatic Function Panel  Recent Labs  07/04/15 1456  PROT 7.9  ALBUMIN 3.9  AST 107*  ALT 42  ALKPHOS 71  BILITOT 0.6   No results for input(s): CHOL in the last 72 hours. No results for input(s): PROTIME in the last 72 hours.  Imaging: Dg Chest Port 1 View  07/05/2015  CLINICAL DATA:  70 year old female with history of shortness of breath. History of congestive heart failure. EXAM: PORTABLE CHEST 1 VIEW COMPARISON:  Chest x-ray 07/04/2015. FINDINGS: Aeration has improved significantly compared to the prior study,  although there continues to be patchy asymmetrically distributed airspace disease predominantly in the left lung base and throughout the right mid to lower lung. Small bilateral pleural effusions. Minimal cephalization of the pulmonary vasculature. Heart size is normal. Upper mediastinal contours are within normal limits. IMPRESSION: 1. Resolving multifocal airspace disease. This was relatively symmetric in appearance on the prior study, which may indicate resolving pulmonary edema, however, the appearance is far more asymmetric on today's study, and heart size is normal. Clinical correlation for multilobar bronchopneumonia is suggested. 2. Small bilateral pleural effusions. Electronically Signed   By: Vinnie Langton M.D.   On: 07/05/2015 10:44   Personally viewed.   Telemetry: 4/29 at 10AM - 5 beats of NSVT Personally viewed.   EKG:  4/30 - NSR, with NSTTW changes Personally viewed.  Cardiac Studies:  Cath 07/04/15:  Troy Sine, MD (Primary)      Procedures    Coronary Stent Intervention   Left Heart Cath and Coronary Angiography    Conclusion     Mid LAD lesion, 60% stenosed.  Prox Cx to Mid Cx lesion, 99% stenosed. The lesion was previously treated with a stent (unknown type).  Ost Cx to Prox Cx lesion, 75% stenosed.  Mid RCA lesion, 85% stenosed. Post intervention, there is a 0% residual stenosis.  Ost RPDA lesion, 99% stenosed. Post intervention, there is a 0% residual stenosis.  Multivessel CAD with diffusely irregular LAD system with 60% stenosis between  the first and second diagonal vessel, subtotal/chronic total occlusion of the previously placed tandem circumflex stents with antegrade filling distally with TIMI 2 flow and small caliber diffusely narrowed ostial proximal circumflex prior to the stented segment; and a dominant RCA with eccentric 80-85% mid stenoses and 99% ostial PDA stenosis.  Successful PCI with ultimate insertion of a 2.514 mm Resolute DES stent  into the PDA vessel, postdilated to 2.62 mm with a 99% stenosis being reduced to 0% and no flow impairment to the jailed continuation branch/PLA vessel, and angioscope scoring balloon/DES stenting with a 2.518 mm Resolute stent postdilated to 2.75 mm in the mid RCA with the 85% stenosis being reduced to 0%.  LV pressure 143/28/37  RECOMMENDATION: Continued DAPT indefinitely The patient will continue with bivalirudin for several hours post procedure, particularly in light of the residual subtotal circumflex stenosis. A 2-D echo Doppler study will be obtained to assess LV function. Renal function will be closely monitored. Patient is at risk for subsequent dialysis. If viability is present in the circumflex territory, consider staged intervention of this subtotal/possible chronic total occlusion.   ECHO: 2013-EF 65-70%.   Meds: Scheduled Meds: . amLODipine  5 mg Oral Daily  . aspirin EC  81 mg Oral Daily  . [START ON 07/06/2015] calcitRIOL  0.25 mcg Oral Once per day on Mon Wed Fri  . famotidine  20 mg Oral Daily  . ferrous sulfate  325 mg Oral Daily  . furosemide  80 mg Intravenous BID  . hydrALAZINE  10 mg Oral Q8H  . insulin aspart  0-20 Units Subcutaneous TID WC  . insulin detemir  15 Units Subcutaneous BID  . latanoprost  1 drop Both Eyes QHS  . metoprolol tartrate  25 mg Oral BID  . rosuvastatin  20 mg Oral QHS  . sodium chloride flush  3 mL Intravenous Q12H  . ticagrelor  90 mg Oral BID   Continuous Infusions: . sodium chloride 75 mL/hr at 07/05/15 0700  . nitroGLYCERIN 45 mcg/min (07/05/15 1139)   PRN Meds:.sodium chloride, acetaminophen, ALPRAZolam, benzonatate, diazepam, ondansetron (ZOFRAN) IV, sodium chloride flush, zolpidem  Assessment/Plan:  Active Problems:   Hyperlipemia   Essential hypertension   CORONARY ATHEROSCLEROSIS NATIVE CORONARY ARTERY   Type 2 diabetes mellitus with stage 4 chronic kidney disease (HCC)   Acute CHF (congestive heart failure) ([place])    Acute renal failure superimposed on stage 4 chronic kidney disease (HCC)   NSTEMI (non-ST elevated myocardial infarction) (HCC)   Elevated d-dimer   Acute on chronic respiratory failure with hypoxia and hypercapnia (HCC)  Non-ST elevation myocardial infarction -Mid RCA stented, ostial PDA stented -Proximal to mid circumflex lesion is 99% stenosis, previously treated with a stent. Subtotal circumflex stenosis. -Per cath report, if viability is present in the circumflex territory consider staged intervention of chronic total occlusion. -Troponin 24 - DAPT lifelong - Await ECHO - Stop NTG drip. Start Imdur 30 - Increase metoprolol to 50 BID from 25 BID - watch Hgb. Down slightly 11.0>9.8  Chronic kidney disease stage IV -Creatinine 2.5, previously was 2.8 in January 2017. Baseline.  Acute diastolic HF - Lasix IV 40 BID - Creat stable 2.5 from 2.9 - BNP 663  Coronary artery disease -Previous PCI to the circumflex with 2 overlapping Taxus stents in 2008. -See above Report.  Essential hypertension -Hydralazine added. No longer on Tribenzor, renal  Diabetes -Per primary team, hemoglobin A1c previously 9  Hyperlipidemia -Statin therapy.     Prabhav Faulkenberry, [place] 07/05/2015, 11:43 AM

## 2015-07-05 NOTE — Progress Notes (Signed)
Right femoral sheath removed.  Pressure held for 20 minutes until hemostasis achieved.  Groin level 1, soft with slight bruising.  Pedal pulses +1 bilaterally.  Pressure dressing applied.  Patient tolerated well and will continue to monitor site. Beemer, Ardeth Sportsman

## 2015-07-05 NOTE — Progress Notes (Signed)
Patient ID: UNKNOWN KUSCHEL, female   DOB: [birth year], 70 y.o.   MRN: [id number] S:Feels much better today O:BP 128/64 mmHg  Pulse 89  Temp(Src) 98.9 F (37.2 C) (Oral)  Resp 22  Ht 5\' 7"  (1.702 m)  Wt 99.9 kg (220 lb 3.8 oz)  BMI 34.49 kg/m2  SpO2 100%  Intake/Output Summary (Last 24 hours) at 07/05/15 1014 Last data filed at 07/05/15 0900  Gross per 24 hour  Intake 1521.79 ml  Output   3650 ml  Net -2128.21 ml   Intake/Output: I/O last 3 completed shifts: In: 1230.4 [P.O.:240; I.V.:990.4] Out: 3650 [Urine:3650]  Intake/Output this shift:  Total I/O In: 291.4 [P.O.:120; I.V.:171.4] Out: 425 [Urine:425] Weight change:  Gen:WD WN AAF in NAD CVS:no rub Resp:cta [id number] edema   Recent Labs Lab 07/04/15 1456 07/05/15 0220  NA 136 138  K 4.3 3.7  CL 94* 97*  CO2 26 28  GLUCOSE 496* 303*  BUN 78* 78*  CREATININE 2.97* 2.55*  ALBUMIN 3.9  --   CALCIUM 9.8 9.3  AST 107*  --   ALT 42  --    Liver Function Tests:  Recent Labs Lab 07/04/15 1456  AST 107*  ALT 42  ALKPHOS 71  BILITOT 0.6  PROT 7.9  ALBUMIN 3.9   No results for input(s): LIPASE, AMYLASE in the last 168 hours. No results for input(s): AMMONIA in the last 168 hours. CBC:  Recent Labs Lab 07/04/15 1456 07/05/15 0220  WBC 7.8 6.6  NEUTROABS 6.3  --   HGB 11.0* 9.8*  HCT 35.6* 30.6*  MCV 102.0* 99.7  PLT 171 176  184   Cardiac Enzymes:  Recent Labs Lab 07/04/15 1456 07/04/15 2155 07/05/15 0220  TROPONINI 17.33* 17.98* 24.26*   CBG:  Recent Labs Lab 07/04/15 1126 07/04/15 1651 07/04/15 2141 07/05/15 0846  GLUCAP 387* 504* 406* 187*    Iron Studies: No results for input(s): IRON, TIBC, TRANSFERRIN, FERRITIN in the last 72 hours. Studies/Results: No results found. Marland Kitchen amLODipine  5 mg Oral Daily  . aspirin EC  81 mg Oral Daily  . [START ON 07/06/2015] calcitRIOL  0.25 mcg Oral Once per day on Mon Wed Fri  . famotidine  20 mg Oral Daily  . ferrous sulfate   325 mg Oral Daily  . furosemide  80 mg Intravenous BID  . hydrALAZINE  10 mg Oral Q8H  . insulin aspart  0-20 Units Subcutaneous TID WC  . insulin detemir  15 Units Subcutaneous BID  . latanoprost  1 drop Both Eyes QHS  . metoprolol tartrate  25 mg Oral BID  . rosuvastatin  20 mg Oral QHS  . sodium chloride flush  3 mL Intravenous Q12H  . ticagrelor  90 mg Oral BID    BMET    Component Value Date/Time   NA 138 07/05/2015 0220   NA 144 06/23/2015 1004   K 3.7 07/05/2015 0220   CL 97* 07/05/2015 0220   CO2 28 07/05/2015 0220   GLUCOSE 303* 07/05/2015 0220   GLUCOSE 39* 06/23/2015 1004   BUN 78* 07/05/2015 0220   BUN 67* 06/23/2015 1004   CREATININE 2.55* 07/05/2015 0220   CREATININE 1.73* 10/08/2013 1024   CALCIUM 9.3 07/05/2015 0220   GFRNONAA 18* 07/05/2015 0220   GFRAA 21* 07/05/2015 0220   CBC    Component Value Date/Time   WBC 6.6 07/05/2015 0220   RBC 3.07* 07/05/2015 0220   HGB 9.8* 07/05/2015 0220   HCT 30.6* 07/05/2015  0220   PLT 176 07/05/2015 0220   PLT 184 07/05/2015 0220   MCV 99.7 07/05/2015 0220   MCH 31.9 07/05/2015 0220   MCHC 32.0 07/05/2015 0220   RDW 14.3 07/05/2015 0220   LYMPHSABS 0.9 07/04/2015 1456   MONOABS 0.6 07/04/2015 1456   EOSABS 0.0 07/04/2015 1456   BASOSABS 0.0 07/04/2015 1456   Cardiac catheterization by Dr. Claiborne Billings on 07/04/15:   Conclusion     Mid LAD lesion, 60% stenosed.  Prox Cx to Mid Cx lesion, 99% stenosed. The lesion was previously treated with a stent (unknown type).  Ost Cx to Prox Cx lesion, 75% stenosed.  Mid RCA lesion, 85% stenosed. Post intervention, there is a 0% residual stenosis.  Ost RPDA lesion, 99% stenosed. Post intervention, there is a 0% residual stenosis.  Multivessel CAD with diffusely irregular LAD system with 60% stenosis between the first and second diagonal vessel, subtotal/chronic total occlusion of the previously placed tandem circumflex stents with antegrade filling distally with TIMI 2  flow and small caliber diffusely narrowed ostial proximal circumflex prior to the stented segment; and a dominant RCA with eccentric 80-85% mid stenoses and 99% ostial PDA stenosis.  Successful PCI with ultimate insertion of a 2.514 mm Resolute DES stent into the PDA vessel, postdilated to 2.62 mm with a 99% stenosis being reduced to 0% and no flow impairment to the jailed continuation branch/PLA vessel, and angioscope scoring balloon/DES stenting with a 2.518 mm Resolute stent postdilated to 2.75 mm in the mid RCA with the 85% stenosis being reduced to 0%.     Assessment/Plan: 1. AKI/CKD in setting of acute respiratory distress and likely flash pulmonary edema. She was diuresed 1.5l but now may require cardiac catheterization for cardiac ischemia/injury. Unfortunately she could not receive IV hydration prior to procedure due to her pulmonary edema and respiratory distress. I had a lengthy discussion with Ms. Tordoff and her daughter regarding the risks of AKI related to contrast and the potential need for hemodialysis if her renal function and volume status were to worsen. We also discussed the risks of not treating ongoing cardiac ischemia. Her ARB was held on admission and would limit contrast used for cardiac cath.  1. Scr actually improved to 2.55 following cardiac cath, however will need to continue to monitor over the next 48 hours 2.  Will continue to follow closely, however there are no other effective renoprotective therapies to offer. 2. Ischemic CAD with elevated troponins and known diffuse CAD and ECG changes.  1. S/p urgent cardiac cath with PTA and DES placement in PDA and RCA  2. Cardiology to re-evaluate with myoview to repeat EKG and decide upon cardiac cath 3. Flash pulmonary edema/CHF- likely due to CAD and acute event. Diuresed well. Off BiPAP. Plan per cardiology. 4. HTN- off benicar (tribenzor) on amlodipine, hydralazine, ntg. Beta-blocker per cardiology. 5. DM-  poorly controlled, changes in insulin per primary svc 6. CKD stage 4- shehas had progressive CKD due to DM/HTN but has declined any education on dialysis and has declined referral to vascular surgery as well. We did discuss the possible need for HD and she was willing to proceed if needed. 7. Anemia of chronic disease- will check iron stores and follow H/H. She may need to start ESA.  [place]

## 2015-07-05 NOTE — Progress Notes (Signed)
Pt's ACT = 188.  VS stable Groin level 1 old drainage noted.  Will continue to monitor Saunders Revel T

## 2015-07-06 ENCOUNTER — Other Ambulatory Visit: Payer: Self-pay

## 2015-07-06 ENCOUNTER — Encounter (HOSPITAL_COMMUNITY): Payer: Self-pay | Admitting: Cardiovascular Disease

## 2015-07-06 ENCOUNTER — Inpatient Hospital Stay (HOSPITAL_COMMUNITY): Payer: BLUE CROSS/BLUE SHIELD

## 2015-07-06 DIAGNOSIS — I509 Heart failure, unspecified: Secondary | ICD-10-CM

## 2015-07-06 LAB — CBC WITH DIFFERENTIAL/PLATELET
BASOS PCT: 0 %
Basophils Absolute: 0 10*3/uL (ref 0.0–0.1)
EOS ABS: 0.1 10*3/uL (ref 0.0–0.7)
Eosinophils Relative: 2 %
HEMATOCRIT: 31.3 % — AB (ref 36.0–46.0)
HEMOGLOBIN: 9.8 g/dL — AB (ref 12.0–15.0)
Lymphocytes Relative: 16 %
Lymphs Abs: 1.2 10*3/uL (ref 0.7–4.0)
MCH: 31.8 pg (ref 26.0–34.0)
MCHC: 31.3 g/dL (ref 30.0–36.0)
MCV: 101.6 fL — ABNORMAL HIGH (ref 78.0–100.0)
MONOS PCT: 9 %
Monocytes Absolute: 0.7 10*3/uL (ref 0.1–1.0)
NEUTROS ABS: 5.3 10*3/uL (ref 1.7–7.7)
NEUTROS PCT: 73 %
Platelets: 169 10*3/uL (ref 150–400)
RBC: 3.08 MIL/uL — AB (ref 3.87–5.11)
RDW: 14.5 % (ref 11.5–15.5)
WBC: 7.4 10*3/uL (ref 4.0–10.5)

## 2015-07-06 LAB — RENAL FUNCTION PANEL
ALBUMIN: 3.3 g/dL — AB (ref 3.5–5.0)
ANION GAP: 13 (ref 5–15)
BUN: 87 mg/dL — ABNORMAL HIGH (ref 6–20)
CALCIUM: 9.1 mg/dL (ref 8.9–10.3)
CO2: 27 mmol/L (ref 22–32)
Chloride: 98 mmol/L — ABNORMAL LOW (ref 101–111)
Creatinine, Ser: 2.73 mg/dL — ABNORMAL HIGH (ref 0.44–1.00)
GFR calc non Af Amer: 17 mL/min — ABNORMAL LOW (ref >60)
GFR, EST AFRICAN AMERICAN: 19 mL/min — AB (ref >60)
GLUCOSE: 143 mg/dL — AB (ref 65–99)
PHOSPHORUS: 3.5 mg/dL (ref 2.5–4.6)
Potassium: 3.8 mmol/L (ref 3.5–5.1)
SODIUM: 138 mmol/L (ref 135–145)

## 2015-07-06 LAB — GLUCOSE, CAPILLARY
GLUCOSE-CAPILLARY: 244 mg/dL — AB (ref 65–99)
GLUCOSE-CAPILLARY: 288 mg/dL — AB (ref 65–99)
Glucose-Capillary: 72 mg/dL (ref 65–99)
Glucose-Capillary: 122 mg/dL — ABNORMAL HIGH (ref 65–99)

## 2015-07-06 LAB — CBC
HCT: 27.6 % — ABNORMAL LOW (ref 36.0–46.0)
HEMOGLOBIN: 8.8 g/dL — AB (ref 12.0–15.0)
MCH: 32.2 pg (ref 26.0–34.0)
MCHC: 31.9 g/dL (ref 30.0–36.0)
MCV: 101.1 fL — ABNORMAL HIGH (ref 78.0–100.0)
Platelets: 147 10*3/uL — ABNORMAL LOW (ref 150–400)
RBC: 2.73 MIL/uL — AB (ref 3.87–5.11)
RDW: 14.6 % (ref 11.5–15.5)
WBC: 7.1 10*3/uL (ref 4.0–10.5)

## 2015-07-06 LAB — IRON AND TIBC
Iron: 25 ug/dL — ABNORMAL LOW (ref 28–170)
Saturation Ratios: 8 % — ABNORMAL LOW (ref 10.4–31.8)
TIBC: 329 ug/dL (ref 250–450)
UIBC: 304 ug/dL

## 2015-07-06 LAB — FOLATE: FOLATE: 15.3 ng/mL (ref >5.9)

## 2015-07-06 LAB — HEMOGLOBIN A1C
Hgb A1c MFr Bld: 9.4 % — ABNORMAL HIGH (ref 4.8–5.6)
MEAN PLASMA GLUCOSE: 223 mg/dL

## 2015-07-06 LAB — ECHOCARDIOGRAM COMPLETE
HEIGHTINCHES: 67 in
WEIGHTICAEL: 3527.36 [oz_av]

## 2015-07-06 LAB — POCT ACTIVATED CLOTTING TIME: ACTIVATED CLOTTING TIME: 363 s

## 2015-07-06 LAB — RETICULOCYTES
RBC.: 3.08 MIL/uL — AB (ref 3.87–5.11)
RETIC COUNT ABSOLUTE: 58.5 10*3/uL (ref 19.0–186.0)
RETIC CT PCT: 1.9 % (ref 0.4–3.1)

## 2015-07-06 LAB — VITAMIN B12: VITAMIN B 12: 1074 pg/mL — AB (ref 180–914)

## 2015-07-06 LAB — ABO/RH: ABO/RH(D): B POS

## 2015-07-06 LAB — MAGNESIUM: Magnesium: 2.5 mg/dL — ABNORMAL HIGH (ref 1.7–2.4)

## 2015-07-06 LAB — TSH: TSH: 0.904 u[IU]/mL (ref 0.350–4.500)

## 2015-07-06 MED ORDER — TRAMADOL HCL 50 MG PO TABS
50.0000 mg | ORAL_TABLET | Freq: Four times a day (QID) | ORAL | Status: DC | PRN
  Administered 2015-07-06 – 2015-07-11 (×3): 50 mg via ORAL
  Filled 2015-07-06 (×3): qty 1

## 2015-07-06 MED ORDER — NITROGLYCERIN 0.4 MG SL SUBL
0.4000 mg | SUBLINGUAL_TABLET | SUBLINGUAL | Status: DC | PRN
  Administered 2015-07-06 (×2): 0.4 mg via SUBLINGUAL
  Filled 2015-07-06: qty 1

## 2015-07-06 MED ORDER — NITROGLYCERIN 0.4 MG SL SUBL
SUBLINGUAL_TABLET | SUBLINGUAL | Status: AC
  Administered 2015-07-06: 0.4 mg
  Filled 2015-07-06: qty 1

## 2015-07-06 MED ORDER — HYDROCODONE-ACETAMINOPHEN 5-325 MG PO TABS
1.0000 | ORAL_TABLET | ORAL | Status: AC | PRN
  Administered 2015-07-07 – 2015-07-09 (×3): 1 via ORAL
  Filled 2015-07-06 (×3): qty 1

## 2015-07-06 MED ORDER — HYDRALAZINE HCL 25 MG PO TABS
25.0000 mg | ORAL_TABLET | Freq: Two times a day (BID) | ORAL | Status: DC
  Administered 2015-07-06 (×2): 25 mg via ORAL
  Filled 2015-07-06 (×2): qty 1

## 2015-07-06 MED FILL — Verapamil HCl IV Soln 2.5 MG/ML: INTRAVENOUS | Qty: 2 | Status: AC

## 2015-07-06 NOTE — Progress Notes (Signed)
  Echocardiogram 2D Echocardiogram has been performed.  Bobbye Charleston 07/06/2015, 3:28 PM

## 2015-07-06 NOTE — Progress Notes (Signed)
Pt c/o of 8/10 right shoulder pain and requesting tylenol. She states the pain is new and different from any other pain she has felt before. EKG obtained, VSS, and pt given scheduled 80mg  Lasix and Hydralazine.  Cardiac PA Rosalyn Gess) notified of pt complaints. Will give patient Xanex and sublingual Nitro. Will monitor closely.  Loni Muse, RN

## 2015-07-06 NOTE — Care Management Note (Signed)
Case Management Note  Patient Details  Name: HAMDA HERRO MRN: [id number] Date of Birth: 08-27-1945  Subjective/Objective:        Adm w mi            Action/Plan: lives w fam, pcp dr hill   Expected Discharge Date:                  Expected Discharge Plan:  Home/Self Care  In-House Referral:     Discharge planning Services  CM Consult, Medication Assistance  Post Acute Care Choice:    Choice offered to:     DME Arranged:    DME Agency:     HH Arranged:    HH Agency:     Status of Service:     Medicare Important Message Given:    Date Medicare IM Given:    Medicare IM give by:    Date Additional Medicare IM Given:    Additional Medicare Important Message give by:     If discussed at [place] of Stay Meetings, dates discussed:    Additional Comments: gave pt 30day free and copay card for brilinta. Pt states still works and has [employer] ins for meds.  Lacretia Leigh, RN 07/06/2015, 9:24 AM

## 2015-07-06 NOTE — Progress Notes (Signed)
[facility] Progress Note  Patient: [name] [id number]   PCP: Maggie Font, MD DOB: 07-15-1945   DOA: 07/04/2015   DOS: 07/06/2015   Date of Service: the patient was seen and examined on 07/06/2015 Outpatient Specialists: Cardiology Dr Bronson Ing Gastroenterology Dr. Oneida Alar  Subjective: Patient numbers of chronic back pain. No chest pain or shortness of breath. Cough is getting better. No nausea no vomiting or diarrhea no active bleeding reported. Nutrition: Tolerating oral diet  Brief hospital course: Patient was admitted on 07/04/2015, with complaint of shortness of breath, was found to have flash pulmonary edema as well as non-ST elevation MI. Patient was initially requiring BiPAP for respiratory distress. Cardio was consulted and the patient underwent cardiac catheterization overnight. Nephrology was also consulted for CKD Currently further plan is continue aggressive diuresis and monitor renal function.  Assessment and Plan: 1. NSTEMI (non-ST elevated myocardial infarction) ([place]) Status post left heart catheterization, Mid RCA and posterior PDA stenting. Lifelong dual antiplatelet. On aspirin and Brilinta Peak troponin 24. Cardiology continues to follow. May require viability study in the LCx territory with a planned staged intervention for chronic total occlusion. Appreciate input from cardiology. On Crestor 20 mg, Lopressor 50 mg, Imdur 30 mg.  2. Chronic kidney disease. Stage IV.Marland Kitchen Acute kidney injury. Patient presented with mild elevation of serum creatinine from her baseline. With her flash pulmonary edema with non-ST elevation MI patient required cardiac catheterization with risk for worsening renal function and requiring dialysis. At present the renal function continues to remain stable and will continue to monitor. Appreciate input from nephrology. Benicar is stopped.  3. 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 Prior echocardiogram showed diastolic  dysfunction. Echocardiogram today shows systolic dysfunction with LV EF of 40-45% as well as grade 2 diastolic dysfunction with probable ischemia in the LCx territory. Currently on Lasix 80 mg twice a day.  4. Multifocal airspace disease on chest x-ray. Possibility of pneumonia cannot be ruled out although the patient does not have any leukocytosis fever or chronic cough to suggest pneumonia. We'll monitor clinically.  5. Type 2 diabetes mellitus with CKD. Hemoglobin A1c on April 2017 9.1. Uncontrolled. Currently on resistant sliding scale The patient is on Degludec at home 70 units at bedtime. Although the conversion to Lantus or Levemir is 1-1 given that the patient will be eating less during the hospitalization I would reduce the dose to 40 units of Lantus  6. Anemia. No Active bleeding. Continue on aspirin and Brilinta. Continue iron supplementation. No evidence of hematoma at the groin site  Activity: physical therapy CONSULTED Bowel regimen: last BM 07/04/2015 Diet: cardiac and diabetic diet DVT Prophylaxis: subcutaneous Heparin  Advance goals of care discussion: Full code  Family Communication: family was present at bedside, at the time of interview. The pt provided permission to discuss medical plan with the family. Opportunity was given to ask question and all questions were answered satisfactorily.   Disposition:  Expected discharge date: 07/07/2015 Barriers to safe discharge: Improvement in CHF symptoms as well as renal function  Consultants: Cardiology, nephrology Procedures: Left heart cath. Echocardiogram  Antibiotics: Anti-infectives    None        Intake/Output Summary (Last 24 hours) at 07/06/15 1737 Last data filed at 07/06/15 1000  Gross per 24 hour  Intake    480 ml  Output   1075 ml  Net   -595 ml   Filed Weights   07/04/15 0946 07/05/15 0324 07/06/15 0310  Weight: 103.9 kg (229 lb 0.9  oz) 99.9 kg (220 lb 3.8 oz) 100 kg (220 lb 7.4 oz)     Objective: Physical Exam: Filed Vitals:   07/06/15 0522 07/06/15 0829 07/06/15 1141 07/06/15 1706  BP: 133/57 140/91 106/53 125/56  Pulse:  81 60 61  Temp:  98.7 F (37.1 C) 98.5 F (36.9 C) 98.8 F (37.1 C)  TempSrc:  Oral Oral Oral  Resp:  20 18 21   Height:      Weight:      SpO2:  100% 100% 100%    General: Appear in mild distress, no Rash; Oral Mucosa moist. Cardiovascular: S1 and S2 Present, no Murmur, positive JVD Respiratory: Bilateral Air entry present and bilateral  Crackles, no wheezes Abdomen: Bowel Sound present, Soft and no tenderness Extremities: no Pedal edema, no calf tenderness  Data Reviewed: CBC:  Recent Labs Lab 07/04/15 1456 07/05/15 0220 07/06/15 0256 07/06/15 0822  WBC 7.8 6.6 7.1 7.4  NEUTROABS 6.3  --   --  5.3  HGB 11.0* 9.8* 8.8* 9.8*  HCT 35.6* 30.6* 27.6* 31.3*  MCV 102.0* 99.7 101.1* 101.6*  PLT 171 176  184 147* [id number]   Basic Metabolic Panel:  Recent Labs Lab 07/04/15 1456 07/05/15 0220 07/06/15 0256  NA 136 138 138  K 4.3 3.7 3.8  CL 94* 97* 98*  CO2 26 28 27   GLUCOSE 496* 303* 143*  BUN 78* 78* 87*  CREATININE 2.97* 2.55* 2.73*  CALCIUM 9.8 9.3 9.1  MG  --   --  2.5*  PHOS  --   --  3.5    Liver Function Tests:  Recent Labs Lab 07/04/15 1456 07/06/15 0256  AST 107*  --   ALT 42  --   ALKPHOS 71  --   BILITOT 0.6  --   PROT 7.9  --   ALBUMIN 3.9 3.3*   No results for input(s): LIPASE, AMYLASE in the last 168 hours. No results for input(s): AMMONIA in the last 168 hours. Coagulation Profile:  Recent Labs Lab 07/04/15 1456  INR 1.16   Cardiac Enzymes:  Recent Labs Lab 07/04/15 1456 07/04/15 2155 07/05/15 0220  TROPONINI 17.33* 17.98* 24.26*   BNP (last 3 results) No results for input(s): PROBNP in the last 8760 hours.  CBG:  Recent Labs Lab 07/05/15 1631 07/05/15 2133 07/06/15 0833 07/06/15 1143 07/06/15 1709  GLUCAP 322* 236* 72 122* 244*    Studies: No results found.    Scheduled Meds: . amLODipine  5 mg Oral Daily  . aspirin EC  81 mg Oral Daily  . calcitRIOL  0.25 mcg Oral Once per day on Mon Wed Fri  . famotidine  20 mg Oral Daily  . ferrous sulfate  325 mg Oral Daily  . furosemide  80 mg Intravenous BID  . hydrALAZINE  25 mg Oral q12n4p  . insulin aspart  0-20 Units Subcutaneous TID WC  . insulin glargine  40 Units Subcutaneous QHS  . isosorbide mononitrate  30 mg Oral Daily  . latanoprost  1 drop Both Eyes QHS  . metoprolol tartrate  50 mg Oral BID  . rosuvastatin  20 mg Oral QHS  . sodium chloride flush  3 mL Intravenous Q12H  . ticagrelor  90 mg Oral BID   Continuous Infusions:  PRN Meds: sodium chloride, acetaminophen, ALPRAZolam, benzonatate, diazepam, ondansetron (ZOFRAN) IV, sodium chloride flush, zolpidem  Time spent: 30 minutes  Author: Berle Mull, MD [facility] Pager: [phone] 07/06/2015 5:37 PM  If 7PM-7AM, please contact night-coverage at [email or web address],  password [facility]

## 2015-07-06 NOTE — Progress Notes (Signed)
Md made aware of pt's labs mag = 2.5, hgb 8.8 this am.  No signs of bleeding noted.  Will continue to monitor. Saunders Revel T

## 2015-07-06 NOTE — Progress Notes (Addendum)
Subjective:  Called to see pt by RN for Rt arm pain and numbness. This came on suddenly tonight-"after I rolled onto my Rt side". She now says her arm is weak and she can't grip with her Rt hand.   Objective:  Vital Signs in the last 24 hours: Temp:  [98.5 F (36.9 C)-99.4 F (37.4 C)] 98.8 F (37.1 C) (05/01 1706) Pulse Rate:  [60-81] 73 (05/01 1900) Resp:  [18-21] 21 (05/01 1706) BP: (106-147)/(52-91) 147/76 mmHg (05/01 1900) SpO2:  [98 %-100 %] 100 % (05/01 1900) Weight:  [220 lb 7.4 oz (100 kg)] 220 lb 7.4 oz (100 kg) (05/01 0310)  Intake/Output from previous day:  Intake/Output Summary (Last 24 hours) at 07/06/15 1955 Last data filed at 07/06/15 1000  Gross per 24 hour  Intake    360 ml  Output    575 ml  Net   -215 ml    Physical Exam: General appearance: alert, cooperative, no distress and moderately obese Neurologic: Awake and alert, she is unable to grip with her Rt hand   Rate: 84  Rhythm: normal sinus rhythm  Lab Results:  Recent Labs  07/06/15 0256 07/06/15 0822  WBC 7.1 7.4  HGB 8.8* 9.8*  PLT 147* 169    Recent Labs  07/05/15 0220 07/06/15 0256  NA 138 138  K 3.7 3.8  CL 97* 98*  CO2 28 27  GLUCOSE 303* 143*  BUN 78* 87*  CREATININE 2.55* 2.73*    Recent Labs  07/04/15 2155 07/05/15 0220  TROPONINI 17.98* 24.26*    Recent Labs  07/04/15 1456  INR 1.16    Scheduled Meds: . amLODipine  5 mg Oral Daily  . aspirin EC  81 mg Oral Daily  . calcitRIOL  0.25 mcg Oral Once per day on Mon Wed Fri  . famotidine  20 mg Oral Daily  . ferrous sulfate  325 mg Oral Daily  . furosemide  80 mg Intravenous BID  . hydrALAZINE  25 mg Oral q12n4p  . insulin aspart  0-20 Units Subcutaneous TID WC  . insulin glargine  40 Units Subcutaneous QHS  . isosorbide mononitrate  30 mg Oral Daily  . latanoprost  1 drop Both Eyes QHS  . metoprolol tartrate  50 mg Oral BID  . rosuvastatin  20 mg Oral QHS  . sodium chloride flush  3 mL Intravenous  Q12H  . ticagrelor  90 mg Oral BID   Continuous Infusions:  PRN Meds:.sodium chloride, acetaminophen, ALPRAZolam, benzonatate, diazepam, nitroGLYCERIN, ondansetron (ZOFRAN) IV, sodium chloride flush, zolpidem   Imaging: Imaging results have been reviewed   Assessment/Plan:   Principal Problem:   NSTEMI (non-ST elevated myocardial infarction) ([place]) Active Problems:   Hyperlipemia   Essential hypertension   CORONARY ATHEROSCLEROSIS NATIVE CORONARY ARTERY   Type 2 diabetes mellitus with stage 4 chronic kidney disease (HCC)   Acute CHF (congestive heart failure) (HCC)   Acute renal failure superimposed on stage 4 chronic kidney disease (HCC)   Elevated d-dimer   Acute on chronic respiratory failure with hypoxia and hypercapnia (HCC)   PLAN: MD to see. I think she has a pinched nerve but we may have to check a head CT (W/O contrast) to r/o CVA.  Unable to use codeine secondary to history of rash and can't use NSAID secondary to renal insufficiency.   Kerin Ransom PA-C 07/06/2015, 7:55 PM [phone]   Patient with right arm pain and weakness after rolling off of her right side. Neuro  exam shows 5/5 strength in right upper extremity, no other neuro deficits. I suspect nerve/MSK related pain due to positioning, clinical scenario not consistent with CVA as she has normal strength, stroke also would not cause pain in the right arm. Will give tramadol 50mg  x 1, she has codeine allergy and would avoid NSAIDs given her renal dysfunction. I have asked nursing to notify primary team to follow up.     Zandra Abts MD

## 2015-07-06 NOTE — Progress Notes (Signed)
[facility] Md made aware of previous events.  Pt still c/o pain 5/2 right arm.  Has improved.  Pt calmer.  Family at bedside.  New orders received. Will continue to monitor. Saunders Revel T

## 2015-07-06 NOTE — Progress Notes (Signed)
Md paged per pt's request to see.  Pt still c/o right arm pain 8/10 and weakness.  Very anxious.  Family at bedside.  Neuro check neg.  Only c/o right arm pain with numbness.  Vs stable.  Will continue to monitor. Saunders Revel T

## 2015-07-06 NOTE — Progress Notes (Signed)
Subjective:  Feels much better; no chest pain or SOB  Objective:   Vital Signs : Filed Vitals:   07/05/15 2322 07/06/15 0310 07/06/15 0522 07/06/15 0829  BP: 124/62 131/60 133/57 140/91  Pulse: 62 71  81  Temp: 99.2 F (37.3 C) 99.4 F (37.4 C)  98.7 F (37.1 C)  TempSrc: Oral Oral  Oral  Resp:    20  Height:      Weight:  220 lb 7.4 oz (100 kg)    SpO2: 98% 100%  100%    Intake/Output from previous day:  Intake/Output Summary (Last 24 hours) at 07/06/15 0959 Last data filed at 07/06/15 0500  Gross per 24 hour  Intake 1119.96 ml  Output   1525 ml  Net -405.04 ml    I/O since admission: -2755  Wt Readings from Last 3 Encounters:  07/06/15 220 lb 7.4 oz (100 kg)  06/30/15 225 lb (102.059 kg)  03/30/15 225 lb (102.059 kg)    Medications: . amLODipine  5 mg Oral Daily  . aspirin EC  81 mg Oral Daily  . calcitRIOL  0.25 mcg Oral Once per day on Mon Wed Fri  . famotidine  20 mg Oral Daily  . ferrous sulfate  325 mg Oral Daily  . furosemide  80 mg Intravenous BID  . hydrALAZINE  10 mg Oral Q8H  . insulin aspart  0-20 Units Subcutaneous TID WC  . insulin glargine  40 Units Subcutaneous QHS  . isosorbide mononitrate  30 mg Oral Daily  . latanoprost  1 drop Both Eyes QHS  . metoprolol tartrate  50 mg Oral BID  . rosuvastatin  20 mg Oral QHS  . sodium chloride flush  3 mL Intravenous Q12H  . ticagrelor  90 mg Oral BID       Physical Exam:   General appearance: alert, cooperative and no distress Neck: no adenopathy, no JVD, supple, symmetrical, trachea midline and thyroid not enlarged, symmetric, no tenderness/mass/nodules Lungs: clear to auscultation bilaterally Heart: regular rate and rhythm and 1/6 systolic murmur; no s3; no rub Abdomen: soft, non-tender; bowel sounds normal; no masses,  no organomegaly R groin cath site stable; no hematoma or ecchymosis Extremities: no edema, redness or tenderness in the calves or thighs Pulses: 2+ and symmetric Skin:  Skin color, texture, turgor normal. No rashes or lesions Neurologic: Grossly normal   Rate: 68  Rhythm: normal sinus rhythm  ECG (independently read by me): NSR at 78; nonspecific T changes  Lab Results:   Recent Labs  07/04/15 1456 07/05/15 0220 07/06/15 0256  NA 136 138 138  K 4.3 3.7 3.8  CL 94* 97* 98*  CO2 _0 GLUCOSE 496* 303* 143*  BUN 78* 78* 87*  CREATININE 2.97* 2.55* 2.73*  CALCIUM 9.8 9.3 9.1  MG  --   --  2.5*  PHOS  --   --  3.5    Hepatic Function Latest Ref Rng 07/06/2015 07/04/2015 03/10/2015  Total Protein 6.5 - 8.1 g/dL - 7.9 7.3  Albumin 3.5 - 5.0 g/dL 3.3(L) 3.9 4.2  AST 15 - 41 U/L - 107(H) 23  ALT 14 - 54 U/L - 42 22  Alk Phosphatase 38 - 126 U/L - 71 65  Total Bilirubin 0.3 - 1.2 mg/dL - 0.6 0.3     Recent Labs  07/04/15 1456 07/05/15 0220 07/06/15 0256 07/06/15 0822  WBC 7.8 6.6 7.1 7.4  NEUTROABS 6.3  --   --  5.3  HGB  11.0* 9.8* 8.8* 9.8*  HCT 35.6* 30.6* 27.6* 31.3*  MCV 102.0* 99.7 101.1* 101.6*  PLT 171 176  184 147* 169     Recent Labs  07/04/15 1456 07/04/15 2155 07/05/15 0220  TROPONINI 17.33* 17.98* 24.26*    No results found for: TSH No results for input(s): HGBA1C in the last 72 hours.   Recent Labs  07/04/15 1456 07/06/15 0256  PROT 7.9  --   ALBUMIN 3.9 3.3*  AST 107*  --   ALT 42  --   ALKPHOS 71  --   BILITOT 0.6  --     Recent Labs  07/04/15 1456  INR 1.16   BNP (last 3 results)  Recent Labs  07/04/15 1456  BNP 663.6*    ProBNP (last 3 results) No results for input(s): PROBNP in the last 8760 hours.   Lipid Panel  No results found for: CHOL, TRIG, HDL, CHOLHDL, VLDL, LDLCALC, LDLDIRECT    Imaging:  Dg Chest Port 1 View  07/05/2015  CLINICAL DATA:  70 year old female with history of shortness of breath. History of congestive heart failure. EXAM: PORTABLE CHEST 1 VIEW COMPARISON:  Chest x-ray 07/04/2015. FINDINGS: Aeration has improved significantly compared to the prior  study, although there continues to be patchy asymmetrically distributed airspace disease predominantly in the left lung base and throughout the right mid to lower lung. Small bilateral pleural effusions. Minimal cephalization of the pulmonary vasculature. Heart size is normal. Upper mediastinal contours are within normal limits. IMPRESSION: 1. Resolving multifocal airspace disease. This was relatively symmetric in appearance on the prior study, which may indicate resolving pulmonary edema, however, the appearance is far more asymmetric on today's study, and heart size is normal. Clinical correlation for multilobar bronchopneumonia is suggested. 2. Small bilateral pleural effusions. Electronically Signed   By: Vinnie Langton M.D.   On: 07/05/2015 10:44    Urgent Cardiac Cath on 07/04/2015 Conclusion     Mid LAD lesion, 60% stenosed.  Prox Cx to Mid Cx lesion, 99% stenosed. The lesion was previously treated with a stent (unknown type).  Ost Cx to Prox Cx lesion, 75% stenosed.  Mid RCA lesion, 85% stenosed. Post intervention, there is a 0% residual stenosis.  Ost RPDA lesion, 99% stenosed. Post intervention, there is a 0% residual stenosis.  Multivessel CAD with diffusely irregular LAD system with 60% stenosis between the first and second diagonal vessel, subtotal/chronic total occlusion of the previously placed tandem circumflex stents with antegrade filling distally with TIMI 2 flow and small caliber diffusely narrowed ostial proximal circumflex prior to the stented segment; and a dominant RCA with eccentric 80-85% mid stenoses and 99% ostial PDA stenosis.  Successful PCI with ultimate insertion of a 2.514 mm Resolute DES stent into the PDA vessel, postdilated to 2.62 mm with a 99% stenosis being reduced to 0% and no impairment to flow in the jailed continuation branch/PLA vessel, and angiosculpt scoring balloon/DES stenting with a 2.518 mm Resolute stent postdilated to 2.75 mm in the mid RCA  with the 85% stenosis being reduced to 0%.  LV pressure 143/28/37  RECOMMENDATION: Continued DAPT indefinitely The patient will continue with bivalirudin for several hours post procedure, particularly in light of the residual subtotal circumflex stenosis. A 2-D echo Doppler study will be obtained to assess LV function. Renal function will be closely monitored. Patient is at risk for subsequent dialysis. If viability is present in the circumflex territory, consider staged intervention of this subtotal/possible chronic total occlusion.  Assessment/Plan:   Principal Problem:   NSTEMI (non-ST elevated myocardial infarction) ([place]) Active Problems:   Hyperlipemia   Essential hypertension   CORONARY ATHEROSCLEROSIS NATIVE CORONARY ARTERY   Type 2 diabetes mellitus with stage 4 chronic kidney disease (HCC)   Acute CHF (congestive heart failure) (HCC)   Acute renal failure superimposed on stage 4 chronic kidney disease (HCC)   Elevated d-dimer   Acute on chronic respiratory failure with hypoxia and hypercapnia ([place])   1. Day 2 s/p PCI to subtotal ostial PDA stenosis and mid RCA with DES stenting.  No recurrent chest pain or dyspnea. Subtotal /chronic CTO of LCX stent with faint antegrade flow.   2. CKD: Cr pre-procedure was 2.9, improved with hydration yesterday to 2.5, today 2.73  3. Macrocytic anemia: check B12/folate;  H/H transient drop post procedure increased to 9.8/31.3 today.  4. HTN:  BP increased to 140/91 today.  Will titrate hydralazine to 25 mg bid today and tomorrow q 8 hrs.   5. Combined  heart failure:  Check echo today  6. HLD: on crestor 20 mg; check lipid panel.  7. DM     Troy Sine, MD, [facility] 07/06/2015, 9:59 AM

## 2015-07-06 NOTE — Progress Notes (Signed)
Assessment/Plan: 1. AKI/CKD in setting of acute respiratory distress and likely flash pulmonary edema. S/p cath  1. Scr 2.73 following cardiac cath 4/29, appears stable 2. Will continue to follow closely 2. Ischemic CAD with elevated troponins and known diffuse CAD and ECG changes.  1. S/p urgent cardiac cath with PTA and DES placement in PDA and RCA  2. Cardiology to re-evaluate with myoview to repeat EKG and decide upon repeat cardiac cath 3. HTN- off benicar (tribenzor) on amlodipine, hydralazine, ntg. Beta-blocker per cardiology. 4. DM- poorly controlled, changes in insulin per primary svc 5. CKD stage 4- she has had progressive CKD due to DM/HTN   Subjective: Interval History: none.  Objective: Vital signs in last 24 hours: Temp:  [98.5 F (36.9 C)-99.4 F (37.4 C)] 98.5 F (36.9 C) (05/01 1141) Pulse Rate:  [60-81] 60 (05/01 1141) Resp:  [18-20] 18 (05/01 1141) BP: (106-148)/(52-91) 106/53 mmHg (05/01 1141) SpO2:  [98 %-100 %] 100 % (05/01 1141) Weight:  [100 kg (220 lb 7.4 oz)] 100 kg (220 lb 7.4 oz) (05/01 0310) Weight change: -3.9 kg (-8 lb 9.6 oz)  Intake/Output from previous day: 04/30 0701 - 05/01 0700 In: 1614.5 [P.O.:1080; I.V.:534.5] Out: 1950 [Urine:1950] Intake/Output this shift: Total I/O In: 240 [P.O.:240] Out: -   General appearance: alert and cooperative Resp: clear to auscultation bilaterally Cardio: regular rate and rhythm, S1, S2 normal, no murmur, click, rub or gallop Extremities: edema tr  Lab Results:  Recent Labs  07/06/15 0256 07/06/15 0822  WBC 7.1 7.4  HGB 8.8* 9.8*  HCT 27.6* 31.3*  PLT 147* 169   BMET:  Recent Labs  07/05/15 0220 07/06/15 0256  NA 138 138  K 3.7 3.8  CL 97* 98*  CO2 28 27  GLUCOSE 303* 143*  BUN 78* 87*  CREATININE 2.55* 2.73*  CALCIUM 9.3 9.1   No results for input(s): PTH in the last 72 hours. Iron Studies:  Recent Labs  07/06/15 0822  IRON 25*  TIBC 329   Studies/Results: Dg Chest  Port 1 View  07/05/2015  CLINICAL DATA:  70 year old female with history of shortness of breath. History of congestive heart failure. EXAM: PORTABLE CHEST 1 VIEW COMPARISON:  Chest x-ray 07/04/2015. FINDINGS: Aeration has improved significantly compared to the prior study, although there continues to be patchy asymmetrically distributed airspace disease predominantly in the left lung base and throughout the right mid to lower lung. Small bilateral pleural effusions. Minimal cephalization of the pulmonary vasculature. Heart size is normal. Upper mediastinal contours are within normal limits. IMPRESSION: 1. Resolving multifocal airspace disease. This was relatively symmetric in appearance on the prior study, which may indicate resolving pulmonary edema, however, the appearance is far more asymmetric on today's study, and heart size is normal. Clinical correlation for multilobar bronchopneumonia is suggested. 2. Small bilateral pleural effusions. Electronically Signed   By: Vinnie Langton M.D.   On: 07/05/2015 10:44    Scheduled: . amLODipine  5 mg Oral Daily  . aspirin EC  81 mg Oral Daily  . calcitRIOL  0.25 mcg Oral Once per day on Mon Wed Fri  . famotidine  20 mg Oral Daily  . ferrous sulfate  325 mg Oral Daily  . furosemide  80 mg Intravenous BID  . hydrALAZINE  25 mg Oral q12n4p  . insulin aspart  0-20 Units Subcutaneous TID WC  . insulin glargine  40 Units Subcutaneous QHS  . isosorbide mononitrate  30 mg Oral Daily  . latanoprost  1 drop  Both Eyes QHS  . metoprolol tartrate  50 mg Oral BID  . rosuvastatin  20 mg Oral QHS  . sodium chloride flush  3 mL Intravenous Q12H  . ticagrelor  90 mg Oral BID     LOS: 2 days   Egon Dittus C 07/06/2015,4:28 PM   6. Anemia of chronic disease- will check iron stores and follow H/H. She may need to start ESA.

## 2015-07-06 NOTE — Progress Notes (Signed)
Pt still c/o 8/10 right shoulder pain after 3 sublingual Nitro. She is requesting a "doctor" to come to her bedside to help her. Cardiology PA [facility]) paged and requested to come see patient. WIll continue to monitor closely.   Loni Muse, RN

## 2015-07-06 NOTE — Progress Notes (Signed)
CARDIAC REHAB PHASE I   PRE:  Rate/Rhythm: 52 SR  BP:  Sitting: 140/91        SaO2: 100 RA  MODE:  Ambulation: assisted pt to recliner with RN   Pt in bed, MD at bedside, will hold ambulation per MD until further notice pending hbg results, groin site assessment. Pt assisted to recliner with RN. Completed MI/stent/HF education with pt.  Reviewed risk factors, MI book, anti-platelet therapy, stent card, activity restrictions, ntg, exercise, heart healthy diet, carb counting, portion control, sodium restrictions, CHF booklet and zone tool, daily weights and phase 2 cardiac rehab. Pt verbalized understanding, receptive to education. Pt agrees to phase 2 cardiac rehab referral, will send to [facility] per pt request. Pt in recliner, feet elevated, call bell within reach. Will follow.   [id number] Lenna Sciara, RN, BSN 07/06/2015 9:23 AM

## 2015-07-07 ENCOUNTER — Inpatient Hospital Stay (HOSPITAL_COMMUNITY): Payer: Medicare Other

## 2015-07-07 ENCOUNTER — Inpatient Hospital Stay (HOSPITAL_COMMUNITY): Payer: BLUE CROSS/BLUE SHIELD

## 2015-07-07 ENCOUNTER — Encounter (HOSPITAL_COMMUNITY): Payer: Self-pay | Admitting: *Deleted

## 2015-07-07 DIAGNOSIS — I998 Other disorder of circulatory system: Secondary | ICD-10-CM

## 2015-07-07 DIAGNOSIS — I771 Stricture of artery: Secondary | ICD-10-CM

## 2015-07-07 DIAGNOSIS — R0989 Other specified symptoms and signs involving the circulatory and respiratory systems: Secondary | ICD-10-CM

## 2015-07-07 LAB — CBC
HCT: 27.2 % — ABNORMAL LOW (ref 36.0–46.0)
HEMOGLOBIN: 8.6 g/dL — AB (ref 12.0–15.0)
MCH: 31.3 pg (ref 26.0–34.0)
MCHC: 31.6 g/dL (ref 30.0–36.0)
MCV: 98.9 fL (ref 78.0–100.0)
Platelets: 150 10*3/uL (ref 150–400)
RBC: 2.75 MIL/uL — AB (ref 3.87–5.11)
RDW: 14.4 % (ref 11.5–15.5)
WBC: 6.8 10*3/uL (ref 4.0–10.5)

## 2015-07-07 LAB — RENAL FUNCTION PANEL
ALBUMIN: 3.2 g/dL — AB (ref 3.5–5.0)
Anion gap: 13 (ref 5–15)
BUN: 96 mg/dL — AB (ref 6–20)
CO2: 25 mmol/L (ref 22–32)
CREATININE: 3.29 mg/dL — AB (ref 0.44–1.00)
Calcium: 8.8 mg/dL — ABNORMAL LOW (ref 8.9–10.3)
Chloride: 93 mmol/L — ABNORMAL LOW (ref 101–111)
GFR calc Af Amer: 15 mL/min — ABNORMAL LOW (ref >60)
GFR, EST NON AFRICAN AMERICAN: 13 mL/min — AB (ref >60)
Glucose, Bld: 253 mg/dL — ABNORMAL HIGH (ref 65–99)
PHOSPHORUS: 5.4 mg/dL — AB (ref 2.5–4.6)
Potassium: 4.5 mmol/L (ref 3.5–5.1)
Sodium: 131 mmol/L — ABNORMAL LOW (ref 135–145)

## 2015-07-07 LAB — GLUCOSE, CAPILLARY
GLUCOSE-CAPILLARY: 230 mg/dL — AB (ref 65–99)
GLUCOSE-CAPILLARY: 210 mg/dL — AB (ref 65–99)
GLUCOSE-CAPILLARY: 302 mg/dL — AB (ref 65–99)
Glucose-Capillary: 262 mg/dL — ABNORMAL HIGH (ref 65–99)

## 2015-07-07 LAB — BASIC METABOLIC PANEL
Anion gap: 12 (ref 5–15)
BUN: 97 mg/dL — AB (ref 6–20)
CHLORIDE: 93 mmol/L — AB (ref 101–111)
CO2: 26 mmol/L (ref 22–32)
Calcium: 8.8 mg/dL — ABNORMAL LOW (ref 8.9–10.3)
Creatinine, Ser: 3.2 mg/dL — ABNORMAL HIGH (ref 0.44–1.00)
GFR calc Af Amer: 16 mL/min — ABNORMAL LOW (ref >60)
GFR, EST NON AFRICAN AMERICAN: 14 mL/min — AB (ref >60)
Glucose, Bld: 261 mg/dL — ABNORMAL HIGH (ref 65–99)
Potassium: 4.4 mmol/L (ref 3.5–5.1)
SODIUM: 131 mmol/L — AB (ref 135–145)

## 2015-07-07 LAB — LIPID PANEL
CHOLESTEROL: 160 mg/dL (ref 0–200)
HDL: 40 mg/dL — ABNORMAL LOW (ref >40)
LDL Cholesterol: 96 mg/dL (ref 0–99)
Total CHOL/HDL Ratio: 4 RATIO
Triglycerides: 119 mg/dL (ref <150)
VLDL: 24 mg/dL (ref 0–40)

## 2015-07-07 LAB — HEPARIN LEVEL (UNFRACTIONATED)
HEPARIN UNFRACTIONATED: 1.04 [IU]/mL — AB (ref 0.30–0.70)
Heparin Unfractionated: 0.71 IU/mL — ABNORMAL HIGH (ref 0.30–0.70)

## 2015-07-07 MED ORDER — INSULIN GLARGINE 100 UNIT/ML ~~LOC~~ SOLN
50.0000 [IU] | Freq: Every day | SUBCUTANEOUS | Status: DC
  Administered 2015-07-07: 50 [IU] via SUBCUTANEOUS
  Filled 2015-07-07 (×2): qty 0.5

## 2015-07-07 MED ORDER — HYDRALAZINE HCL 25 MG PO TABS
25.0000 mg | ORAL_TABLET | Freq: Three times a day (TID) | ORAL | Status: DC
  Administered 2015-07-07 – 2015-07-08 (×3): 25 mg via ORAL
  Filled 2015-07-07 (×3): qty 1

## 2015-07-07 MED ORDER — ISOSORBIDE MONONITRATE ER 60 MG PO TB24
60.0000 mg | ORAL_TABLET | Freq: Every day | ORAL | Status: DC
  Administered 2015-07-08 – 2015-07-12 (×3): 60 mg via ORAL
  Filled 2015-07-07 (×3): qty 1

## 2015-07-07 MED ORDER — HEPARIN (PORCINE) IN NACL 100-0.45 UNIT/ML-% IJ SOLN
950.0000 [IU]/h | INTRAMUSCULAR | Status: DC
  Administered 2015-07-07: 1100 [IU]/h via INTRAVENOUS
  Administered 2015-07-08: 950 [IU]/h via INTRAVENOUS
  Filled 2015-07-07 (×2): qty 250

## 2015-07-07 MED ORDER — HEPARIN BOLUS VIA INFUSION
3000.0000 [IU] | Freq: Once | INTRAVENOUS | Status: AC
  Administered 2015-07-07: 3000 [IU] via INTRAVENOUS
  Filled 2015-07-07: qty 3000

## 2015-07-07 MED ORDER — ROSUVASTATIN CALCIUM 10 MG PO TABS
40.0000 mg | ORAL_TABLET | Freq: Every day | ORAL | Status: DC
  Administered 2015-07-07 – 2015-07-11 (×5): 40 mg via ORAL
  Filled 2015-07-07 (×2): qty 4
  Filled 2015-07-07: qty 2
  Filled 2015-07-07 (×3): qty 4

## 2015-07-07 MED ORDER — HEPARIN (PORCINE) IN NACL 100-0.45 UNIT/ML-% IJ SOLN
1300.0000 [IU]/h | INTRAMUSCULAR | Status: DC
  Administered 2015-07-07: 1300 [IU]/h via INTRAVENOUS
  Filled 2015-07-07: qty 250

## 2015-07-07 NOTE — Progress Notes (Signed)
[place] for Heparin Indication: possible subclavian occlusion  Allergies  Allergen Reactions  . Ace Inhibitors Cough    Pt can tolerate Tribenzor (and ARBs)  . Codeine Rash    Patient Measurements: Height: 5\' 7"  (170.2 cm) Weight: 234 lb 12.6 oz (106.5 kg) IBW/kg (Calculated) : 61.6 Heparin Dosing Weight: 85 kg  Vital Signs: Temp: 98 F (36.7 C) (05/02 2300) Temp Source: Oral (05/02 2300) BP: 117/60 mmHg (05/02 2300) Pulse Rate: 68 (05/02 2123)  Labs:  Recent Labs  07/05/15 0220 07/06/15 0256 07/06/15 [id number] 07/07/15 0259 07/07/15 1211 07/07/15 2214  HGB 9.8* 8.8* 9.8* 8.6*  --   --   HCT 30.6* 27.6* 31.3* 27.2*  --   --   PLT 176  184 147* 169 150  --   --   HEPARINUNFRC  --   --   --   --  1.04* 0.71*  CREATININE 2.55* 2.73*  --  3.29*  3.20*  --   --   TROPONINI 24.26*  --   --   --   --   --     Estimated Creatinine Clearance: 20 mL/min (by C-G formula based on Cr of 3.29).  Assessment: 70 y.o. female admitted with NSTEMI s/p PCI 4/29, with subclavian stenosis/occlusion, for heparin  Goal of Therapy:  Heparin level 0.3-0.7 units/ml Monitor platelets by anticoagulation protocol: Yes   Plan:  Decrease Heparin 1000 units/hr   [name], [facility] 07/07/2015,11:48 PM

## 2015-07-07 NOTE — Progress Notes (Signed)
CARDIAC REHAB PHASE I   PRE:  Rate/Rhythm: 60 SR  BP:  Supine: 136/56  Sitting:   Standing:    SaO2: 100% 4L   MODE:  Ambulation:  ft  pivoted to chair  POST:  Rate/Rhythm: 60 SR  BP:  Supine:   Sitting: 137/62  Standing:    SaO2: 100% 4L 1200-1225 Offered to walk with pt but declined due to right hand pain. Assisted to recliner and got pt comfortable. Pt had a little difficulty just taking a few steps to recliner that was right against bed. Put right hand up on 2 pillows. Daughter in room. Will follow up tomorrow. Had gotten pain med earlier.  Graylon Good, RN BSN  07/07/2015 12:19 PM

## 2015-07-07 NOTE — Progress Notes (Signed)
Called Md about pt's right hand being cooler and  Barely doppler only.  Unable to get bp in right arm.  Placed o2 probe to right and very small wave noted at times.  Pt states arm pain still at 8/10 but has improved.  Pt able to move right arm.  Still has numbness and tingles.  No swelling, redness noted in right arm.   Family updated at bedside. Will continue to monitor. Md to bedside.

## 2015-07-07 NOTE — Progress Notes (Signed)
ANTICOAGULATION CONSULT NOTE - Initial Consult  Pharmacy Consult for Heparin Indication: possible subclavian occlusion  Allergies  Allergen Reactions  . Ace Inhibitors Cough    Pt can tolerate Tribenzor (and ARBs)  . Codeine Rash    Patient Measurements: Height: 5\' 7"  (170.2 cm) Weight: 220 lb 7.4 oz (100 kg) IBW/kg (Calculated) : 61.6 Heparin Dosing Weight: 85 kg  Vital Signs: Temp: 98.3 F (36.8 C) (05/01 2306) Temp Source: Oral (05/01 2306) BP: 147/67 mmHg (05/02 0200) Pulse Rate: 56 (05/02 0000)  Labs:  Recent Labs  07/04/15 1456 07/04/15 2155 07/05/15 0220 07/06/15 0256 07/06/15 0822  HGB 11.0*  --  9.8* 8.8* 9.8*  HCT 35.6*  --  30.6* 27.6* 31.3*  PLT 171  --  176  184 147* 169  LABPROT 15.0  --   --   --   --   INR 1.16  --   --   --   --   HEPARINUNFRC  --  0.21*  --   --   --   CREATININE 2.97*  --  2.55* 2.73*  --   TROPONINI 17.33* 17.98* 24.26*  --   --     Estimated Creatinine Clearance: 23.3 mL/min (by C-G formula based on Cr of 2.73).   Medical History: Past Medical History  Diagnosis Date  . Hypertension   . Hyperlipidemia   . Diabetes mellitus   . Obesity   . GERD (gastroesophageal reflux disease)   . Heart murmur, systolic   . Anxiety   . Constipation   . Overactive bladder   . Allergic rhinitis   . Low back pain   . Osteoarthritis   . History of hysterectomy   . Acute CHF (congestive heart failure) ([place]) 07/04/2015  . Acute renal failure superimposed on stage 4 chronic kidney disease ([place]) 07/04/2015    Medications:  Scheduled:  . amLODipine  5 mg Oral Daily  . aspirin EC  81 mg Oral Daily  . calcitRIOL  0.25 mcg Oral Once per day on Mon Wed Fri  . famotidine  20 mg Oral Daily  . ferrous sulfate  325 mg Oral Daily  . furosemide  80 mg Intravenous BID  . hydrALAZINE  25 mg Oral q12n4p  . insulin aspart  0-20 Units Subcutaneous TID WC  . insulin glargine  40 Units Subcutaneous QHS  . isosorbide mononitrate  30 mg Oral Daily   . latanoprost  1 drop Both Eyes QHS  . metoprolol tartrate  50 mg Oral BID  . rosuvastatin  20 mg Oral QHS  . sodium chloride flush  3 mL Intravenous Q12H  . ticagrelor  90 mg Oral BID    Assessment: 70 y.o. female admitted with NSTEMI s/p PCI 4/29, now with right arm pain/numbness and possible occlusion, for heparin  Goal of Therapy:  Heparin level 0.3-0.7 units/ml Monitor platelets by anticoagulation protocol: Yes   Plan:  Heparin 3000 units IV bolus, then start heparin 1300 units/hr Check heparin level in 8 hours.   Edgel Degnan, [facility] 07/07/2015,2:56 AM

## 2015-07-07 NOTE — Progress Notes (Signed)
Subjective:  Feels much better; no chest pain or SOB  Objective:   Vital Signs : Filed Vitals:   07/07/15 0257 07/07/15 0400 07/07/15 0727 07/07/15 0800  BP: 133/57 124/58 146/64 142/64  Pulse:      Temp: 98.5 F (36.9 C)  98 F (36.7 C)   TempSrc: Oral  Oral   Resp: 20  18   Height:      Weight: 234 lb 12.6 oz (106.5 kg)     SpO2: 100% 100% 100% 100%    Intake/Output from previous day:  Intake/Output Summary (Last 24 hours) at 07/07/15 0954 Last data filed at 07/07/15 0900  Gross per 24 hour  Intake 903.05 ml  Output   2050 ml  Net -1146.95 ml    I/O since admission: -3,902  Wt Readings from Last 3 Encounters:  07/07/15 234 lb 12.6 oz (106.5 kg)  06/30/15 225 lb (102.059 kg)  03/30/15 225 lb (102.059 kg)    Medications: . amLODipine  5 mg Oral Daily  . aspirin EC  81 mg Oral Daily  . calcitRIOL  0.25 mcg Oral Once per day on Mon Wed Fri  . famotidine  20 mg Oral Daily  . ferrous sulfate  325 mg Oral Daily  . hydrALAZINE  25 mg Oral q12n4p  . insulin aspart  0-20 Units Subcutaneous TID WC  . insulin glargine  40 Units Subcutaneous QHS  . isosorbide mononitrate  30 mg Oral Daily  . latanoprost  1 drop Both Eyes QHS  . metoprolol tartrate  50 mg Oral BID  . rosuvastatin  20 mg Oral QHS  . sodium chloride flush  3 mL Intravenous Q12H  . ticagrelor  90 mg Oral BID    . heparin 1,300 Units/hr (07/07/15 0309)    Physical Exam:   BP LEFT ARM: 145/68 ; BP RIGHT ARM: 52/25  General appearance: alert, cooperative and no distress Neck: no adenopathy, no JVD, supple, symmetrical, trachea midline and thyroid not enlarged, symmetric, no tenderness/mass/nodules Lungs: clear to auscultation bilaterally Heart: regular rate and rhythm and 1/6 systolic murmur; no s3; no rub Abdomen: soft, non-tender; bowel sounds normal; no masses,  no organomegaly R groin cath site stable; no hematoma or ecchymosis Extremities: no edema, redness or tenderness in the calves or  thighs Pulses: decreased pulse R arm Skin: Skin color, texture, turgor normal. No rashes or lesions Neurologic: Grossly normal   Rate: 68  Rhythm: normal sinus rhythm  ECG (independently read by me): NSR at 78; nonspecific T changes  Lab Results:   Recent Labs  07/05/15 0220 07/06/15 0256 07/07/15 0259  NA 138 138 131*  131*  K 3.7 3.8 4.5  4.4  CL 97* 98* 93*  93*  CO2 _0 GLUCOSE 303* 143* 253*  261*  BUN 78* 87* 96*  97*  CREATININE 2.55* 2.73* 3.29*  3.20*  CALCIUM 9.3 9.1 8.8*  8.8*  MG  --  2.5*  --   PHOS  --  3.5 5.4*    Hepatic Function Latest Ref Rng 07/07/2015 07/06/2015 07/04/2015  Total Protein 6.5 - 8.1 g/dL - - 7.9  Albumin 3.5 - 5.0 g/dL 3.2(L) 3.3(L) 3.9  AST 15 - 41 U/L - - 107(H)  ALT 14 - 54 U/L - - 42  Alk Phosphatase 38 - 126 U/L - - 71  Total Bilirubin 0.3 - 1.2 mg/dL - - 0.6     Recent Labs  07/04/15 1456  07/06/15 0256 07/06/15  9924 07/07/15 0259  WBC 7.8  < > 7.1 7.4 6.8  NEUTROABS 6.3  --   --  5.3  --   HGB 11.0*  < > 8.8* 9.8* 8.6*  HCT 35.6*  < > 27.6* 31.3* 27.2*  MCV 102.0*  < > 101.1* 101.6* 98.9  PLT 171  < > 147* 169 150  < > = values in this interval not displayed.   Recent Labs  07/04/15 1456 07/04/15 2155 07/05/15 0220  TROPONINI 17.33* 17.98* 24.26*    Lab Results  Component Value Date   TSH 0.904 07/06/2015    Recent Labs  07/04/15 2155  HGBA1C 9.4*     Recent Labs  07/04/15 1456 07/06/15 0256 07/07/15 0259  PROT 7.9  --   --   ALBUMIN 3.9 3.3* 3.2*  AST 107*  --   --   ALT 42  --   --   ALKPHOS 71  --   --   BILITOT 0.6  --   --     Recent Labs  07/04/15 1456  INR 1.16   BNP (last 3 results)  Recent Labs  07/04/15 1456  BNP 663.6*    ProBNP (last 3 results) No results for input(s): PROBNP in the last 8760 hours.   Lipid Panel     Component Value Date/Time   CHOL 160 07/07/2015 0259   TRIG 119 07/07/2015 0259   HDL 40* 07/07/2015 0259   CHOLHDL 4.0  07/07/2015 0259   VLDL 24 07/07/2015 0259   LDLCALC 96 07/07/2015 0259      Imaging:  Urgent Cardiac Cath on 07/04/2015 Conclusion     Mid LAD lesion, 60% stenosed.  Prox Cx to Mid Cx lesion, 99% stenosed. The lesion was previously treated with a stent (unknown type).  Ost Cx to Prox Cx lesion, 75% stenosed.  Mid RCA lesion, 85% stenosed. Post intervention, there is a 0% residual stenosis.  Ost RPDA lesion, 99% stenosed. Post intervention, there is a 0% residual stenosis.  Multivessel CAD with diffusely irregular LAD system with 60% stenosis between the first and second diagonal vessel, subtotal/chronic total occlusion of the previously placed tandem circumflex stents with antegrade filling distally with TIMI 2 flow and small caliber diffusely narrowed ostial proximal circumflex prior to the stented segment; and a dominant RCA with eccentric 80-85% mid stenoses and 99% ostial PDA stenosis.  Successful PCI with ultimate insertion of a 2.514 mm Resolute DES stent into the PDA vessel, postdilated to 2.62 mm with a 99% stenosis being reduced to 0% and no impairment to flow in the jailed continuation branch/PLA vessel, and angiosculpt scoring balloon/DES stenting with a 2.518 mm Resolute stent postdilated to 2.75 mm in the mid RCA with the 85% stenosis being reduced to 0%.  LV pressure 143/28/37  RECOMMENDATION: Continued DAPT indefinitely The patient will continue with bivalirudin for several hours post procedure, particularly in light of the residual subtotal circumflex stenosis. A 2-D echo Doppler study will be obtained to assess LV function. Renal function will be closely monitored. Patient is at risk for subsequent dialysis. If viability is present in the circumflex territory, consider staged intervention of this subtotal/possible chronic total occlusion.         ------------------------------------------------------------------- 07/06/15 ECHO Study Conclusions  -  Procedure narrative: Transthoracic echocardiography. Image  quality was poor. The study was technically difficult, as a  result of poor acoustic windows, poor sound wave transmission,  and breast implants. - Left ventricle: The cavity size was normal. There  was moderate  concentric hypertrophy. Systolic function was mildly to  moderately reduced. The estimated ejection fraction was in the  range of 40% to 45%. Hypokinesis of the mid-apicalanterolateral  and inferolateral myocardium; consistent with ischemia in the  distribution of the left circumflex coronary artery. Possible  hypokinesis of the mid-apicalanterior myocardium; in the  distribution of the left anterior descending coronary artery.  (Anterior wall not as well visualized). Features are consistent  with a pseudonormal left ventricular filling pattern, with  concomitant abnormal relaxation and increased filling pressure  (grade 2 diastolic dysfunction). - Mitral valve: There was mild regurgitation. - Left atrium: The atrium was mildly dilated.  PRELIMINARY DUPLEX currently being done at bedside suggests stenosis in right ?subclavian region with monophasic flow distal to stenosis  Assessment/Plan:   Principal Problem:   NSTEMI (non-ST elevated myocardial infarction) ([place]) Active Problems:   Hyperlipemia   Essential hypertension   CORONARY ATHEROSCLEROSIS NATIVE CORONARY ARTERY   Type 2 diabetes mellitus with stage 4 chronic kidney disease (HCC)   Acute CHF (congestive heart failure) (HCC)   Acute renal failure superimposed on stage 4 chronic kidney disease (HCC)   Elevated d-dimer   Acute on chronic respiratory failure with hypoxia and hypercapnia ([place])   1. Day 3 s/p PCI to subtotal ostial PDA stenosis and mid RCA with DES stenting.  No recurrent chest pain or dyspnea. Subtotal /chronic CTO of LCX stent with faint antegrade flow.   2. CKD: Cr pre-procedure was 2.9, initially improved with hydration to  2.5 ->2.73 -> 3.2 today    3. Reduced pulse R arm;  Pt had reduced pulse at cath so radial artery approach was not attempted;  Lesion in ?subclavian with antegrade in vertebral and nl carotid flow.  3. Macrocytic anemia: B12 1074; folate 15   H/H today 8.6/27.2  4. HTN:  BP increased to 140/91 today.  Will titrate hydralazine to 25 mg  q 8 hrs.   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 HLD: on crestor 20 mg;  LDL 96; will increase crestor to 40 mg  7. DM  Dr. Donnetta Hutching has seen patient for vascular insufficiency; suspect due to subclavian stenosis but await his assessment.  Will increase nitrates to isosorbide to 60 mg with LCX probable ischemia, suspect viability. Will not pursue LCX PCI with renal issues for now.  Nephrology following.   Troy Sine, MD, [facility] 07/07/2015, 9:54 AM

## 2015-07-07 NOTE — Progress Notes (Signed)
VASCULAR LAB PRELIMINARY  PRELIMINARY  PRELIMINARY  PRELIMINARY  Carotid duplex and right upper extremity arterial duplex completed.    Preliminary report:   1.  Carotid:  Bilateral:  1-39% ICA stenosis.  Vertebral artery flow is antegrade.     2.  RUE arterial:  Brachial pressures taken with in room monitor:  Right 52/74mmHg  Left 145/26mmHg.  Abnormal waveform in the subclavian artery.  Monophasic waveforms distally. Right vertebral is antegrade.  Probable subclavian stenosis distal to the vertebral.   Sihaam Chrobak, RVT 07/07/2015, 10:30 AM

## 2015-07-07 NOTE — Progress Notes (Signed)
Pt r hand was noted to be colder and unable to feel pulse-judy nurse made aware.

## 2015-07-07 NOTE — Progress Notes (Signed)
[facility] Progress Note  Patient: [name] [id number]   PCP: Maggie Font, MD DOB: 1945/06/29   DOA: 07/04/2015   DOS: 07/07/2015   Date of Service: the patient was seen and examined on 07/07/2015 Outpatient Specialists: Cardiology Dr Bronson Ing Gastroenterology Dr. Oneida Alar  Subjective: No acute complaint. Overnight the patient was found to have difficulty obtaining blood pressure right hand, Doppler was used for pulse and it was not significant. Vascular surgery was called. Patient was started on heparin. Nutrition: Tolerating oral diet  Brief hospital course: Patient was admitted on 07/04/2015, with complaint of shortness of breath, was found to have flash pulmonary edema as well as non-ST elevation MI. Patient was initially requiring BiPAP for respiratory distress. Cardio was consulted and the patient underwent cardiac catheterization overnight. Nephrology was also consulted for CKD. Patient appears to have subclavian stenosis likely chronic in nature. Currently further plan is continue aggressive diuresis and monitor renal function.  Assessment and Plan: 1. NSTEMI (non-ST elevated myocardial infarction) ([place]) Status post left heart catheterization, Mid RCA and posterior PDA stenting. Lifelong dual antiplatelet. On aspirin and Brilinta Peak troponin 24. Cardiology continues to follow. May require viability study in the LCx territory with a planned staged intervention for chronic total occlusion. Appreciate input from cardiology. On Crestor 20 mg, Lopressor 50 mg, Imdur 30 mg.  2. Chronic kidney disease. Stage IV. Acute kidney injury. Patient presented with mild elevation of serum creatinine from her baseline. Renal function further worsening on 07/07/2015 With her flash pulmonary edema with non-ST elevation MI patient required cardiac catheterization with risk for worsening renal function and requiring dialysis. At present the renal function continues to remain stable  and will continue to monitor. Appreciate input from nephrology. Benicar is stopped. I have also discontinued Lasix.  3. 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 Prior echocardiogram showed diastolic dysfunction. Echocardiogram [id number] shows systolic dysfunction with LV EF of 40-45% as well as grade 2 diastolic dysfunction with probable ischemia in the LCx territory. Initially was on Lasix 80 mg twice a day. With worsening renal function discontinuing Lasix. Can be resumed on 07/08/2015  4. Multifocal airspace disease on chest x-ray. Possibility of pneumonia cannot be ruled out although the patient does not have any leukocytosis fever or chronic cough to suggest pneumonia. We'll monitor clinically.  5. Type 2 diabetes mellitus with CKD. Hemoglobin A1c on April 2017 9.1. Uncontrolled. Currently on resistant sliding scale The patient is on Degludec at home 70 units at bedtime. Although the conversion to Lantus or Levemir is 1-1 given that the patient will be eating less during the hospitalization I would reduce the dose to 50 units of Lantus  6. Anemia. No Active bleeding. Continue on aspirin and Brilinta. Continue iron supplementation. No evidence of hematoma at the groin site  7. 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 Currently on heparin.  Activity: physical therapy CONSULTED Bowel regimen: last BM 07/04/2015 Diet: cardiac and diabetic diet DVT Prophylaxis: subcutaneous Heparin  Advance goals of care discussion: Full code  Family Communication: family was present at bedside, at the time of interview. The pt provided permission to discuss medical plan with the family. Opportunity was given to ask question and all questions were answered satisfactorily.   Disposition:  Barriers to safe discharge: Improvement in CHF symptoms as well as renal function and further workup of the LCx stenosis as well as subclavian stenosis  Consultants:  Cardiology, nephrology Procedures: Left heart cath. Echocardiogram  Antibiotics: Anti-infectives    None  Intake/Output Summary (Last 24 hours) at 07/07/15 1306 Last data filed at 07/07/15 1300  Gross per 24 hour  Intake 865.05 ml  Output   2550 ml  Net -1684.95 ml   Filed Weights   07/05/15 0324 07/06/15 0310 07/07/15 0257  Weight: 99.9 kg (220 lb 3.8 oz) 100 kg (220 lb 7.4 oz) 106.5 kg (234 lb 12.6 oz)    Objective: Physical Exam: Filed Vitals:   07/07/15 0400 07/07/15 0727 07/07/15 0800 07/07/15 1117  BP: 124/58 146/64 142/64 127/56  Pulse:    60  Temp:  98 F (36.7 C)  98.1 F (36.7 C)  TempSrc:  Oral  Oral  Resp:  18  20  Height:      Weight:      SpO2: 100% 100% 100% 100%    General: Appear in mild distress, no Rash; Oral Mucosa moist. Cardiovascular: S1 and S2 Present, no Murmur, no JVD Respiratory: Bilateral Air entry present and bilateral  Crackles, no wheezes Abdomen: Bowel Sound present, Soft and no tenderness Extremities: no Pedal edema, no calf tenderness  Data Reviewed: CBC:  Recent Labs Lab 07/04/15 1456 07/05/15 0220 07/06/15 0256 07/06/15 0822 07/07/15 0259  WBC 7.8 6.6 7.1 7.4 6.8  NEUTROABS 6.3  --   --  5.3  --   HGB 11.0* 9.8* 8.8* 9.8* 8.6*  HCT 35.6* 30.6* 27.6* 31.3* 27.2*  MCV 102.0* 99.7 101.1* 101.6* 98.9  PLT 171 176  184 147* 169 [id number]   Basic Metabolic Panel:  Recent Labs Lab 07/04/15 1456 07/05/15 0220 07/06/15 0256 07/07/15 0259  NA 136 138 138 131*  131*  K 4.3 3.7 3.8 4.5  4.4  CL 94* 97* 98* 93*  93*  CO2 26 28 27 25  26   GLUCOSE 496* 303* 143* 253*  261*  BUN 78* 78* 87* 96*  97*  CREATININE 2.97* 2.55* 2.73* 3.29*  3.20*  CALCIUM 9.8 9.3 9.1 8.8*  8.8*  MG  --   --  2.5*  --   PHOS  --   --  3.5 5.4*    Liver Function Tests:  Recent Labs Lab 07/04/15 1456 07/06/15 0256 07/07/15 0259  AST 107*  --   --   ALT 42  --   --   ALKPHOS 71  --   --   BILITOT 0.6  --   --   PROT 7.9  --    --   ALBUMIN 3.9 3.3* 3.2*   No results for input(s): LIPASE, AMYLASE in the last 168 hours. No results for input(s): AMMONIA in the last 168 hours. Coagulation Profile:  Recent Labs Lab 07/04/15 1456  INR 1.16   Cardiac Enzymes:  Recent Labs Lab 07/04/15 1456 07/04/15 2155 07/05/15 0220  TROPONINI 17.33* 17.98* 24.26*   BNP (last 3 results) No results for input(s): PROBNP in the last 8760 hours.  CBG:  Recent Labs Lab 07/06/15 1143 07/06/15 1709 07/06/15 2006 07/07/15 0729 07/07/15 1120  GLUCAP 122* 244* 288* 230* 210*    Studies: No results found.   Scheduled Meds: . amLODipine  5 mg Oral Daily  . aspirin EC  81 mg Oral Daily  . calcitRIOL  0.25 mcg Oral Once per day on Mon Wed Fri  . famotidine  20 mg Oral Daily  . ferrous sulfate  325 mg Oral Daily  . hydrALAZINE  25 mg Oral Q8H  . insulin aspart  0-20 Units Subcutaneous TID WC  . insulin glargine  40 Units Subcutaneous QHS  . [  START ON 07/08/2015] isosorbide mononitrate  60 mg Oral Daily  . latanoprost  1 drop Both Eyes QHS  . metoprolol tartrate  50 mg Oral BID  . rosuvastatin  40 mg Oral QHS  . sodium chloride flush  3 mL Intravenous Q12H  . ticagrelor  90 mg Oral BID   Continuous Infusions: . heparin 1,300 Units/hr (07/07/15 0309)   PRN Meds: sodium chloride, acetaminophen, ALPRAZolam, benzonatate, diazepam, HYDROcodone-acetaminophen, nitroGLYCERIN, ondansetron (ZOFRAN) IV, sodium chloride flush, traMADol, zolpidem  Time spent: 30 minutes  Author: Berle Mull, MD [facility] Pager: [phone] 07/07/2015 1:06 PM  If 7PM-7AM, please contact night-coverage at [email or web address], password [facility]

## 2015-07-07 NOTE — Progress Notes (Signed)
Notified md about pt's am labs:  Phos 5.4, albumin 3.2, cr 3.29, hgb 8.6.  Pt resting with family at bedside.  Little  change in right arm.  Pt still has pain but improving.  Will continue to monitor. Saunders Revel T

## 2015-07-07 NOTE — Care Management Important Message (Signed)
Important Message  Patient Details  Name: DERRIANNA BURGES MRN: [id number] Date of Birth: October 02, 1945   Medicare Important Message Given:  Yes    Barb Merino Shondrika Hoque 07/07/2015, 12:48 PM

## 2015-07-07 NOTE — Progress Notes (Addendum)
Called by RN, Bethena Roys, because pt's right hand was cool on exam. Wave form on oximetry not picking up on the right. Doppler used and right radial pulse and brachial pulse barely audible. NP to bedside. S: + right arm pain. + hx neck surgery. + tingling to right hand. Denies hx of peripheral circulatory problems.  O: Well appearing AAF in NAD. Right hand is cool. No palpable right radial or brachial pulse. + faint pulsation with doppler. No BP picking up in RUE. Neuro exam is intact. RUE sensory and motor intact.  A/P: 1. ? Subclavian occlusion/steel syndrome-NP called Dr. Donnetta Hutching with vascular surgery to report above. Since, motor and sensory intact, not an urgent/emergent issue tonight per Dr. Donnetta Hutching. Recommends Heparin drip. Vascular will officially consult in am. Unfortunately, pt with acute on chronic renal failure and can not receive contrast at this time. Further recs per vascular.  Awaiting call back from cardio about Heparin drip since pt was placed on Brilinta post cath.  [name] Boll, NP [facility] NP spoke with Dr. Philbert Riser of cardiology. OK for pt to be on Heparin drip. Also, continue with ASA/Brilinta.  KJKG, NP [facility]

## 2015-07-07 NOTE — Progress Notes (Signed)
[name] paged  Doppler pulse is weak on both R Radial and R brachial and still complaining of tingling sensation/coolness.

## 2015-07-07 NOTE — Consult Note (Signed)
Patient name: [name] MRN: [id number] DOB: 1945/12/17 Sex: female     Reason for referral: Right arm ischemia  HISTORY OF PRESENT ILLNESS: Very complex 70 year old female admitted with myocardial infarction and critical coronary disease. Has long history of coronary disease. Also has a prior history of cervical disc disease and lumbar disc disease. The patient reports that for years she has had numbness in her right arm. Typically this is more positional than it is exertional. 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 No prior history of stroke. No other history of embolic disease and no history of atrial fibrillation. He went the catheterization via femoral approach and coronary stenting on 07/04/2015. Does have a history of stage IV chronic renal disease. He was noted to have more discomfort in her entire right arm yesterday and this was initially felt to be related to her typical symptoms of nerve type pain. Her hand is cooler on the right than on the left. She reports this improved through the day yesterday and this morning reports that it is at or near baseline.  Past Medical History  Diagnosis Date  . Hypertension   . Hyperlipidemia   . Diabetes mellitus   . Obesity   . GERD (gastroesophageal reflux disease)   . Heart murmur, systolic   . Anxiety   . Constipation   . Overactive bladder   . Allergic rhinitis   . Low back pain   . Osteoarthritis   . History of hysterectomy   . Acute CHF (congestive heart failure) ([place]) 07/04/2015  . Acute renal failure superimposed on stage 4 chronic kidney disease ([place]) 07/04/2015    Past Surgical History  Procedure Laterality Date  . Carpal tunnel release    . Trigger finger release    . Cervical biopsy      cervical lymph node biopsies  . Back surgery      multiple  . Coronary stent placement  04/11/2006    2 -- Taxus stents to the circumflex   . Cardiac catheterization  04/04/2006    Est EF of 60%  .  Abdominal hysterectomy    . Tendonitis      bilateral elbow  . Colonoscopy  May 2002    Dr. Irving Shows :Followup in 5 years, normal exam  . Colonoscopy  2008    Dr. Laural Golden: Very redundant colon with mild melanosis coli, splenic flexure polyp biopsy with acute complaint of benign colon polyp. Recommended ten-year followup  . Cardiac catheterization N/A 07/04/2015    Procedure: Left Heart Cath and Coronary Angiography;  Surgeon: Troy Sine, MD;  Location: [facility];  Service: Cardiovascular;  Laterality: N/A;  . Cardiac catheterization N/A 07/04/2015    Procedure: Coronary Stent Intervention;  Surgeon: Troy Sine, MD;  Location: [facility];  Service: Cardiovascular;  Laterality: N/A;    Social History   Social History  . Marital Status: Married    Spouse Name: N/A  . Number of Children: 3  . Years of Education: N/A   Occupational History  . Not on file.   Social History Main Topics  . Smoking status: Never Smoker   . Smokeless tobacco: Never Used  . Alcohol Use: No  . Drug Use: No  . Sexual Activity: Not on file   Other Topics Concern  . Not on file   Social History Narrative    Family History  Problem Relation Age of Onset  .  Diabetes Father   . Hypertension Father   . Stroke Father   . Hypertension Brother   . Aneurysm Brother   . Diabetes Brother   . Colon cancer Neg Hx     Allergies as of 07/04/2015 - Review Complete 07/04/2015  Allergen Reaction Noted  . Ace inhibitors Cough 04/11/2006  . Codeine Rash 01/20/2011    No current facility-administered medications on file prior to encounter.   Current Outpatient Prescriptions on File Prior to Encounter  Medication Sig Dispense Refill  . ALPRAZolam (XANAX) 0.5 MG tablet Take 0.5 mg by mouth at bedtime as needed for sleep.     Marland Kitchen aspirin EC 81 MG tablet Take 81 mg by mouth daily.    . benzonatate (TESSALON) 100 MG capsule Take 100 mg by mouth daily as needed for cough.    . calcitRIOL  (ROCALTROL) 0.25 MCG capsule Take 0.25 mcg by mouth every Monday, Wednesday, and Friday.     . clopidogrel (PLAVIX) 75 MG tablet TAKE 1 TABLET BY MOUTH EVERY DAY 30 tablet 6  . famotidine (PEPCID) 20 MG tablet Take 20 mg by mouth daily.    . ferrous sulfate 325 (65 FE) MG tablet Take 325 mg by mouth daily.    . Flax Oil-Fish Oil-Borage Oil (FISH OIL-FLAX OIL-BORAGE OIL) CAPS Take 2 capsules by mouth daily.    Marland Kitchen gabapentin (NEURONTIN) 100 MG capsule Take 100 mg by mouth daily as needed (pain).     . Insulin Degludec (TRESIBA FLEXTOUCH) 100 UNIT/ML SOPN Inject 70 Units into the skin at bedtime.    . insulin lispro (HUMALOG KWIKPEN) 100 UNIT/ML KiwkPen Inject 0.15-0.21 mLs (15-21 Units total) into the skin 3 (three) times daily. (Patient taking differently: Inject 15-21 Units into the skin 3 (three) times daily with meals. Per sliding scale) 15 mL 2  . latanoprost (XALATAN) 0.005 % ophthalmic solution Place 1 drop into both eyes at bedtime.     . meclizine (ANTIVERT) 12.5 MG tablet Take 12.5 mg by mouth 2 (two) times daily.     . metoprolol tartrate (LOPRESSOR) 25 MG tablet TAKE ONE TABLET BY MOUTH TWICE DAILY 60 tablet 6  . Olmesartan-Amlodipine-HCTZ (TRIBENZOR) 40-10-25 MG TABS Take 1 tablet by mouth daily. 30 tablet   . rosuvastatin (CRESTOR) 20 MG tablet Take 20 mg by mouth at bedtime.     . torsemide (DEMADEX) 20 MG tablet Take 20 mg by mouth 2 (two) times daily.     Marland Kitchen trimethoprim (TRIMPEX) 100 MG tablet Take 100 mg by mouth daily.     Marland Kitchen glucose blood (ACCU-CHEK AVIVA) test strip Use to check blood glucose 4 times a day. E11.65 150 each 5     REVIEW OF SYSTEMS:  Reviewed in her hospital chart with nothing to add  PHYSICAL EXAMINATION:  General: The patient is a well-nourished female, in no acute distress. Vital signs are BP 146/64 mmHg  Pulse 56  Temp(Src) 98 F (36.7 C) (Oral)  Resp 18  Ht 5\' 7"  (1.702 m)  Wt 234 lb 12.6 oz (106.5 kg)  BMI 36.76 kg/m2  SpO2 100% Pulmonary:  There is a good air exchange  Abdomen: Soft and non-tender  Musculoskeletal: There are no major deformities.  There is no significant extremity pain. Neurologic: She does have equal good grip strength bilaterally. She does report that there may be some mild paresthesia in her right hand. Skin: There are no ulcer or rashes noted. Psychiatric: The patient has normal affect. Cardiovascular: Easily palpable left radial pulse.  Absent radial pulse and absent ulnar pulse on the right. She does have audible Doppler flow in her right radial and right brachial. This is very dampened.    Impression and Plan:  Reduce flow to her right arm. This in all likelihood is chronic with possible acute exacerbation. Unclear as to why there would be any change. She was not a catheterized to the right radial approach. Had femoral catheterization. There was no arch study due to concern about limiting contrast. There is no contraindication to IV heparin solution this has been initiated. We will obtain noninvasive studies today for further identification of level of occlusive disease. Patient continues to have coronary lesions with consideration for repeat cath and repeat angioplasty. Could obtain innominate or right subclavian injection at the time of this cath for better definition as well. No indication for emergent surgery currently. Would be at some increased risk due to her cardiac disease. Will follow with you.    Curt Jews [facility] Office: [phone]

## 2015-07-07 NOTE — Progress Notes (Signed)
[place] for Heparin Indication: possible subclavian occlusion  Allergies  Allergen Reactions  . Ace Inhibitors Cough    Pt can tolerate Tribenzor (and ARBs)  . Codeine Rash    Patient Measurements: Height: 5\' 7"  (170.2 cm) Weight: 234 lb 12.6 oz (106.5 kg) IBW/kg (Calculated) : 61.6 Heparin Dosing Weight: 85 kg  Vital Signs: Temp: 98.1 F (36.7 C) (05/02 1117) Temp Source: Oral (05/02 1117) BP: 127/56 mmHg (05/02 1117) Pulse Rate: 60 (05/02 1117)  Labs:  Recent Labs  07/04/15 1456 07/04/15 2155 07/05/15 0220 07/06/15 0256 07/06/15 0822 07/07/15 0259 07/07/15 1211  HGB 11.0*  --  9.8* 8.8* 9.8* 8.6*  --   HCT 35.6*  --  30.6* 27.6* 31.3* 27.2*  --   PLT 171  --  176  184 147* 169 150  --   LABPROT 15.0  --   --   --   --   --   --   INR 1.16  --   --   --   --   --   --   HEPARINUNFRC  --  0.21*  --   --   --   --  1.04*  CREATININE 2.97*  --  2.55* 2.73*  --  3.29*  3.20*  --   TROPONINI 17.33* 17.98* 24.26*  --   --   --   --     Estimated Creatinine Clearance: 20 mL/min (by C-G formula based on Cr of 3.29).   Medical History: Past Medical History  Diagnosis Date  . Hypertension   . Hyperlipidemia   . Diabetes mellitus   . Obesity   . GERD (gastroesophageal reflux disease)   . Heart murmur, systolic   . Anxiety   . Constipation   . Overactive bladder   . Allergic rhinitis   . Low back pain   . Osteoarthritis   . History of hysterectomy   . Acute CHF (congestive heart failure) ([place]) 07/04/2015  . Acute renal failure superimposed on stage 4 chronic kidney disease ([place]) 07/04/2015    Medications:  Scheduled:  . amLODipine  5 mg Oral Daily  . aspirin EC  81 mg Oral Daily  . calcitRIOL  0.25 mcg Oral Once per day on Mon Wed Fri  . famotidine  20 mg Oral Daily  . ferrous sulfate  325 mg Oral Daily  . hydrALAZINE  25 mg Oral Q8H  . insulin aspart  0-20 Units Subcutaneous TID WC  . insulin glargine  40  Units Subcutaneous QHS  . [START ON 07/08/2015] isosorbide mononitrate  60 mg Oral Daily  . latanoprost  1 drop Both Eyes QHS  . metoprolol tartrate  50 mg Oral BID  . rosuvastatin  40 mg Oral QHS  . sodium chloride flush  3 mL Intravenous Q12H  . ticagrelor  90 mg Oral BID    Assessment: 70 y.o. female admitted with NSTEMI s/p PCI 4/29, now with right arm pain/numbness and possible occlusion, for heparin.  Initial heparin level above goal at 1.04.  No bleeding or complications noted.  Confirmed with patient and family, heparin level was drawn from opposite arm from heparin infusion.    Goal of Therapy:  Heparin level 0.3-0.7 units/ml Monitor platelets by anticoagulation protocol: Yes   Plan:  Decrease IV heparin infusion to 1100 units/hr Recheck heparin level in 8 hrs. Daily heparin level and CBC. Plans for oral anticoagulation?  Uvaldo Rising, BCPS  Clinical Pharmacist Pager (  [phone]  07/07/2015 1:51 PM

## 2015-07-07 NOTE — Progress Notes (Signed)
Inpatient Diabetes Program Recommendations  AACE/ADA: New Consensus Statement on Inpatient Glycemic Control (2015)  Target Ranges:  Prepandial:   less than 140 mg/dL      Peak postprandial:   less than 180 mg/dL (1-2 hours)      Critically ill patients:  140 - 180 mg/dL  Results for [name], [name] (MRN [id number]) as of 07/07/2015 09:11  Ref. Range 07/06/2015 08:33 07/06/2015 11:43 07/06/2015 17:09 07/06/2015 20:06 07/07/2015 07:29  Glucose-Capillary Latest Ref Range: 65-99 mg/dL 72 122 (H) 244 (H) 288 (H) 230 (H)   Review of Glycemic Control  Diabetes history: DM2 Outpatient Diabetes medications: Tresiba 70 units QHS, Humalog 15-21 units TID with meals Current orders for Inpatient glycemic control: Lantus 40 units QHS, Novolog 0-20 units TID with meals  Inpatient Diabetes Program Recommendations: Correction (SSI): Please consider ordering Novolog bedtime correction scale. Insulin - Meal Coverage: Please consider ordering Novolog 5 units TID with meals for meal coverage.  Thanks, Barnie Alderman, RN, MSN, CDE Diabetes Coordinator Inpatient Diabetes Program [phone] (Team Pager from [place] to [place]) [phone] ([place] office) [phone] [facility] office) [phone] [facility] office)

## 2015-07-07 NOTE — Progress Notes (Addendum)
Telemetry strip reviewed with charge nurse, looks a-flutter  And pt is undergoing carotid doppler and doppler US of right arm at that time, pt is not in any  Distress.( documented by CMT as v-fib/v-tach.)

## 2015-07-07 NOTE — Progress Notes (Signed)
Assessment/Plan: 1. AKI/CKD in setting of NSTEMI and likely flash pulmonary edema. S/p cath on 4/29. Creatinine 3.29, up from 2.73. Suspected to be secondary to contrast. Output of 2050cc/24hr. History of CKD stage 4 due to hypertension and diabetes. Continue to monitor creatinine with daily renal function panel. 2. Ischemic CAD with elevated troponins and known diffuse CAD and ECG changes. Cath 4/29 with PTA and DES placement in PDA and RCA. Cardiology following. Considering repeat cath after creatinine more stable. 3. Ischemia of Right Hand- Vascular Surgery consulted. No pulses palpable. 4. HTN- off benicar (tribenzor) on amlodipine, hydralazine, ntg. Beta-blocker per cardiology. 5. DM- poorly controlled, changes in insulin per primary svc 6. Anemia of chronic disease- Hemoglobin 8.6.  Renal Attending: Cath/contrast exposure on 4/29. 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 Rhen Dossantos C   Subjective: Reports feeling much better since admission. Does note numbness of right hand, but denies pain. Denies chest pain.  Objective: Vital signs in last 24 hours: Temp:  [97.9 F (36.6 C)-98.8 F (37.1 C)] 98.1 F (36.7 C) (05/02 1117) Pulse Rate:  [56-84] 60 (05/02 1117) Resp:  [18-22] 20 (05/02 1117) BP: (124-154)/(56-76) 127/56 mmHg (05/02 1117) SpO2:  [94 %-100 %] 100 % (05/02 1117) Weight:  [234 lb 12.6 oz (106.5 kg)] 234 lb 12.6 oz (106.5 kg) (05/02 0257) Weight change: 14 lb 5.3 oz (6.5 kg)  Intake/Output from previous day: 05/01 0701 - 05/02 0700 In: 877.1 [P.O.:840; I.V.:37.1] Out: 2050 [Urine:2050] Intake/Output this shift: Total I/O In: 228 [P.O.:150; I.V.:78] Out: 500 [Urine:500]  General appearance: alert and cooperative Resp: clear to auscultation bilaterally Cardio: regular rate and rhythm, S1, S2 normal, no murmur, click, rub or gallop Extremities: edema tr, no right radial pulse palpable, right hand cool to touch, decreased sensation  of right hand  Lab Results:  Recent Labs  07/06/15 0822 07/07/15 0259  WBC 7.4 6.8  HGB 9.8* 8.6*  HCT 31.3* 27.2*  PLT 169 150   BMET:   Recent Labs  07/06/15 0256 07/07/15 0259  NA 138 131*  131*  K 3.8 4.5  4.4  CL 98* 93*  93*  CO2 27 25  26   GLUCOSE 143* 253*  261*  BUN 87* 96*  97*  CREATININE 2.73* 3.29*  3.20*  CALCIUM 9.1 8.8*  8.8*   No results for input(s): PTH in the last 72 hours. Iron Studies:   Recent Labs  07/06/15 0822  IRON 25*  TIBC 329   Studies/Results: No results found.  Scheduled: . amLODipine  5 mg Oral Daily  . aspirin EC  81 mg Oral Daily  . calcitRIOL  0.25 mcg Oral Once per day on Mon Wed Fri  . famotidine  20 mg Oral Daily  . ferrous sulfate  325 mg Oral Daily  . hydrALAZINE  25 mg Oral Q8H  . insulin aspart  0-20 Units Subcutaneous TID WC  . insulin glargine  40 Units Subcutaneous QHS  . [START ON 07/08/2015] isosorbide mononitrate  60 mg Oral Daily  . latanoprost  1 drop Both Eyes QHS  . metoprolol tartrate  50 mg Oral BID  . rosuvastatin  40 mg Oral QHS  . sodium chloride flush  3 mL Intravenous Q12H  . ticagrelor  90 mg Oral BID     LOS: 3 days   [facility] 07/07/2015,1:18 PM

## 2015-07-08 LAB — RENAL FUNCTION PANEL
Albumin: 3.1 g/dL — ABNORMAL LOW (ref 3.5–5.0)
Anion gap: 12 (ref 5–15)
BUN: 91 mg/dL — AB (ref 6–20)
CALCIUM: 8.8 mg/dL — AB (ref 8.9–10.3)
CO2: 28 mmol/L (ref 22–32)
CREATININE: 2.57 mg/dL — AB (ref 0.44–1.00)
Chloride: 93 mmol/L — ABNORMAL LOW (ref 101–111)
GFR calc non Af Amer: 18 mL/min — ABNORMAL LOW (ref >60)
GFR, EST AFRICAN AMERICAN: 21 mL/min — AB (ref >60)
Glucose, Bld: 254 mg/dL — ABNORMAL HIGH (ref 65–99)
Phosphorus: 3.8 mg/dL (ref 2.5–4.6)
Potassium: 4 mmol/L (ref 3.5–5.1)
SODIUM: 133 mmol/L — AB (ref 135–145)

## 2015-07-08 LAB — GLUCOSE, CAPILLARY
Glucose-Capillary: 213 mg/dL — ABNORMAL HIGH (ref 65–99)
Glucose-Capillary: 203 mg/dL — ABNORMAL HIGH (ref 65–99)
Glucose-Capillary: 205 mg/dL — ABNORMAL HIGH (ref 65–99)
Glucose-Capillary: 291 mg/dL — ABNORMAL HIGH (ref 65–99)

## 2015-07-08 LAB — CBC WITH DIFFERENTIAL/PLATELET
BASOS PCT: 0 %
Basophils Absolute: 0 10*3/uL (ref 0.0–0.1)
EOS ABS: 0.2 10*3/uL (ref 0.0–0.7)
Eosinophils Relative: 4 %
HCT: 27.5 % — ABNORMAL LOW (ref 36.0–46.0)
HEMOGLOBIN: 8.7 g/dL — AB (ref 12.0–15.0)
Lymphocytes Relative: 16 %
Lymphs Abs: 0.9 10*3/uL (ref 0.7–4.0)
MCH: 31.9 pg (ref 26.0–34.0)
MCHC: 31.6 g/dL (ref 30.0–36.0)
MCV: 100.7 fL — ABNORMAL HIGH (ref 78.0–100.0)
Monocytes Absolute: 0.4 10*3/uL (ref 0.1–1.0)
Monocytes Relative: 7 %
NEUTROS PCT: 73 %
Neutro Abs: 4.1 10*3/uL (ref 1.7–7.7)
Platelets: 153 10*3/uL (ref 150–400)
RBC: 2.73 MIL/uL — AB (ref 3.87–5.11)
RDW: 14.3 % (ref 11.5–15.5)
WBC: 5.6 10*3/uL (ref 4.0–10.5)

## 2015-07-08 LAB — COMPREHENSIVE METABOLIC PANEL
ALBUMIN: 3.1 g/dL — AB (ref 3.5–5.0)
ALK PHOS: 56 U/L (ref 38–126)
ALT: 29 U/L (ref 14–54)
ANION GAP: 11 (ref 5–15)
AST: 46 U/L — ABNORMAL HIGH (ref 15–41)
BUN: 92 mg/dL — ABNORMAL HIGH (ref 6–20)
CALCIUM: 8.8 mg/dL — AB (ref 8.9–10.3)
CO2: 28 mmol/L (ref 22–32)
CREATININE: 2.62 mg/dL — AB (ref 0.44–1.00)
Chloride: 94 mmol/L — ABNORMAL LOW (ref 101–111)
GFR, EST AFRICAN AMERICAN: 20 mL/min — AB (ref >60)
GFR, EST NON AFRICAN AMERICAN: 17 mL/min — AB (ref >60)
Glucose, Bld: 259 mg/dL — ABNORMAL HIGH (ref 65–99)
Potassium: 4 mmol/L (ref 3.5–5.1)
SODIUM: 133 mmol/L — AB (ref 135–145)
Total Bilirubin: 0.7 mg/dL (ref 0.3–1.2)
Total Protein: 6.8 g/dL (ref 6.5–8.1)

## 2015-07-08 LAB — PROTIME-INR
INR: 1.11 (ref 0.00–1.49)
PROTHROMBIN TIME: 14.5 s (ref 11.6–15.2)

## 2015-07-08 LAB — HEPARIN LEVEL (UNFRACTIONATED)
Heparin Unfractionated: 0.7 IU/mL (ref 0.30–0.70)
Heparin Unfractionated: 0.56 IU/mL (ref 0.30–0.70)

## 2015-07-08 LAB — MAGNESIUM: MAGNESIUM: 2.9 mg/dL — AB (ref 1.7–2.4)

## 2015-07-08 MED ORDER — INSULIN ASPART 100 UNIT/ML ~~LOC~~ SOLN
4.0000 [IU] | Freq: Three times a day (TID) | SUBCUTANEOUS | Status: DC
  Administered 2015-07-08 – 2015-07-09 (×2): 4 [IU] via SUBCUTANEOUS

## 2015-07-08 MED ORDER — INSULIN GLARGINE 100 UNIT/ML ~~LOC~~ SOLN
60.0000 [IU] | Freq: Every day | SUBCUTANEOUS | Status: DC
  Administered 2015-07-08: 60 [IU] via SUBCUTANEOUS
  Filled 2015-07-08 (×3): qty 0.6

## 2015-07-08 MED ORDER — HYDRALAZINE HCL 25 MG PO TABS
37.5000 mg | ORAL_TABLET | Freq: Three times a day (TID) | ORAL | Status: DC
  Administered 2015-07-08 – 2015-07-12 (×11): 37.5 mg via ORAL
  Filled 2015-07-08 (×14): qty 2

## 2015-07-08 NOTE — Progress Notes (Signed)
Inpatient Diabetes Program Recommendations  AACE/ADA: New Consensus Statement on Inpatient Glycemic Control (2015)  Target Ranges:  Prepandial:   less than 140 mg/dL      Peak postprandial:   less than 180 mg/dL (1-2 hours)      Critically ill patients:  140 - 180 mg/dL  Results for CHAUNTEL, GORSUCH (MRN [id number]) as of 07/08/2015 11:20  Ref. Range 07/07/2015 07:29 07/07/2015 11:20 07/07/2015 16:46 07/07/2015 21:34 07/08/2015 08:24  Glucose-Capillary Latest Ref Range: 65-99 mg/dL 230 (H) 210 (H) 262 (H) 302 (H) 213 (H)   Review of Glycemic Control  Diabetes history: DM2 Outpatient Diabetes medications: Tresiba 70 units QHS, Humalog 15-21 units TID with meals Current orders for Inpatient glycemic control: Lantus 50 units QHS, Novolog 0-20 units TID with meals  Inpatient Diabetes Program Recommendations: Correction (SSI): Please consider ordering Novolog bedtime correction scale. Insulin - Meal Coverage: Please consider ordering Novolog 6 units TID with meals for meal coverage.  Thanks, Barnie Alderman, RN, MSN, CDE Diabetes Coordinator Inpatient Diabetes Program [phone] (Team Pager from [place] to [place]) [phone] ([place] office) [phone] [facility] office) [phone] [facility] office)

## 2015-07-08 NOTE — Progress Notes (Signed)
Assessment/Plan: 1. AKI/CKD in setting of NSTEMI and likely flash pulmonary edema. S/p cath on 4/29 with contrast exposure. Creatinine 2.57, down from 3.29 yesterday. Suspected to be secondary to contrast. Output of 1650cc/24hr. History of CKD stage 4 due to hypertension and diabetes. Recommend hydration with minimal contrast if another catheterization indicated. Nephrology signing off, may contact for if needed. 2. Ischemic CAD with elevated troponins and known diffuse CAD and ECG changes. Cath 4/29 with PTA and DES placement in PDA and RCA. Cardiology following. Considering repeat cath after creatinine more stable. 3. Ischemia of Right Hand- Vascular Surgery consulted. No pulses palpable. Imaging with subclavial stenosis. 4. HTN- off benicar (tribenzor) on amlodipine, hydralazine, ntg. Beta-blocker per cardiology. 5. DM- poorly controlled, changes in insulin per primary svc 6. Anemia of chronic disease- Hemoglobin 8.7.  Renal Attending: She is stable post contrast.  She has CKD and the mild CIN has resolved.  She will follow up as OP with Dr. Lorrene Reid. Erling Cruz C   Subjective: Continues to feel well. Notes numbness in right hand, unchanged. Also with some right knee pain with known arthritis.  Objective: Vital signs in last 24 hours: Temp:  [98 F (36.7 C)-98.3 F (36.8 C)] 98.1 F (36.7 C) (05/03 0334) Pulse Rate:  [59-68] 68 (05/02 2123) Resp:  [18-20] 18 (05/03 0334) BP: (114-148)/(55-70) 120/57 mmHg (05/03 0334) SpO2:  [99 %-100 %] 99 % (05/03 0334) Weight:  [225 lb 1.4 oz (102.1 kg)] 225 lb 1.4 oz (102.1 kg) (05/03 0334) Weight change: -9 lb 11.2 oz (-4.4 kg)  Intake/Output from previous day: 05/02 0701 - 05/03 0700 In: 811.8 [P.O.:550; I.V.:261.8] Out: 1650 [Urine:1650] Intake/Output this shift: Total I/O In: 117.8 [I.V.:117.8] Out: 1150 [Urine:1150]  General appearance: alert and cooperative Resp: clear to auscultation bilaterally Cardio: regular rate and  rhythm, S1, S2 normal, no murmur, click, rub or gallop Extremities: edema tr, no right radial pulse palpable, right hand cool to touch, decreased sensation of right hand  Lab Results:  Recent Labs  07/06/15 0822 07/07/15 0259  WBC 7.4 6.8  HGB 9.8* 8.6*  HCT 31.3* 27.2*  PLT 169 150   BMET:   Recent Labs  07/06/15 0256 07/07/15 0259  NA 138 131*  131*  K 3.8 4.5  4.4  CL 98* 93*  93*  CO2 27 25  26   GLUCOSE 143* 253*  261*  BUN 87* 96*  97*  CREATININE 2.73* 3.29*  3.20*  CALCIUM 9.1 8.8*  8.8*   No results for input(s): PTH in the last 72 hours. Iron Studies:   Recent Labs  07/06/15 0822  IRON 25*  TIBC 329   Studies/Results: No results found.  Scheduled: . amLODipine  5 mg Oral Daily  . aspirin EC  81 mg Oral Daily  . calcitRIOL  0.25 mcg Oral Once per day on Mon Wed Fri  . famotidine  20 mg Oral Daily  . ferrous sulfate  325 mg Oral Daily  . hydrALAZINE  25 mg Oral Q8H  . insulin aspart  0-20 Units Subcutaneous TID WC  . insulin glargine  50 Units Subcutaneous QHS  . isosorbide mononitrate  60 mg Oral Daily  . latanoprost  1 drop Both Eyes QHS  . metoprolol tartrate  50 mg Oral BID  . rosuvastatin  40 mg Oral QHS  . sodium chloride flush  3 mL Intravenous Q12H  . ticagrelor  90 mg Oral BID     LOS: 4 days   [facility] 07/08/2015,6:58 AM

## 2015-07-08 NOTE — Progress Notes (Addendum)
Subjective:  Feels much better; no chest pain or SOB  Objective:   Vital Signs : Filed Vitals:   07/07/15 2123 07/07/15 2300 07/08/15 0334 07/08/15 0820  BP:  117/60 120/57 130/66  Pulse: 68     Temp:  98 F (36.7 C) 98.1 F (36.7 C) 98.5 F (36.9 C)  TempSrc:  Oral Oral Oral  Resp:  _0 Height:      Weight:   225 lb 1.4 oz (102.1 kg)   SpO2:  100% 99% 99%    Intake/Output from previous day:  Intake/Output Summary (Last 24 hours) at 07/08/15 0902 Last data filed at 07/08/15 0600  Gross per 24 hour  Intake 785.83 ml  Output   1650 ml  Net -864.17 ml    I/O since admission: -4,766  Wt Readings from Last 3 Encounters:  07/08/15 225 lb 1.4 oz (102.1 kg)  06/30/15 225 lb (102.059 kg)  03/30/15 225 lb (102.059 kg)    Medications: . amLODipine  5 mg Oral Daily  . aspirin EC  81 mg Oral Daily  . calcitRIOL  0.25 mcg Oral Once per day on Mon Wed Fri  . famotidine  20 mg Oral Daily  . ferrous sulfate  325 mg Oral Daily  . hydrALAZINE  25 mg Oral Q8H  . insulin aspart  0-20 Units Subcutaneous TID WC  . insulin glargine  50 Units Subcutaneous QHS  . isosorbide mononitrate  60 mg Oral Daily  . latanoprost  1 drop Both Eyes QHS  . metoprolol tartrate  50 mg Oral BID  . rosuvastatin  40 mg Oral QHS  . sodium chloride flush  3 mL Intravenous Q12H  . ticagrelor  90 mg Oral BID    . heparin 1,000 Units/hr (07/07/15 2350)    Physical Exam:   BP LEFT ARM: 145/68 ; BP RIGHT ARM: 52/25 during vascular doppler evaluation on 07/07/15  General appearance: alert, cooperative and no distress Neck: no adenopathy, no JVD, supple, symmetrical, trachea midline and thyroid not enlarged, symmetric, no tenderness/mass/nodules Lungs: clear to auscultation bilaterally Heart: regular rate and rhythm and 1/6 systolic murmur; no s3; no rub Abdomen: soft, non-tender; bowel sounds normal; no masses,  no organomegaly R groin cath site stable; no hematoma or ecchymosis Extremities:  no edema, redness or tenderness in the calves or thighs Pulses: decreased pulse R arm Skin: Skin color, texture, turgor normal. No rashes or lesions Neurologic: Grossly normal   Rate: 68  Rhythm: normal sinus rhythm  ECG (independently read by me): NSR at 78; nonspecific T changes  Lab Results:   Recent Labs  07/06/15 0256 07/07/15 0259 07/08/15 0646  NA 138 131*  131* 133*  133*  K 3.8 4.5  4.4 4.0  4.0  CL 98* 93*  93* 93*  94*  CO2 _1 GLUCOSE 143* 253*  261* 254*  259*  BUN 87* 96*  97* 91*  92*  CREATININE 2.73* 3.29*  3.20* 2.57*  2.62*  CALCIUM 9.1 8.8*  8.8* 8.8*  8.8*  MG 2.5*  --  2.9*  PHOS 3.5 5.4* 3.8    Hepatic Function Latest Ref Rng 07/08/2015 07/08/2015 07/07/2015  Total Protein 6.5 - 8.1 g/dL 6.8 - -  Albumin 3.5 - 5.0 g/dL 3.1(L) 3.1(L) 3.2(L)  AST 15 - 41 U/L 46(H) - -  ALT 14 - 54 U/L 29 - -  Alk Phosphatase 38 - 126 U/L 56 - -  Total  Bilirubin 0.3 - 1.2 mg/dL 0.7 - -     Recent Labs  07/06/15 0822 07/07/15 0259 07/08/15 0646  WBC 7.4 6.8 5.6  NEUTROABS 5.3  --  4.1  HGB 9.8* 8.6* 8.7*  HCT 31.3* 27.2* 27.5*  MCV 101.6* 98.9 100.7*  PLT 169 150 153    No results for input(s): TROPONINI in the last 72 hours.  Invalid input(s): CK, MB  Lab Results  Component Value Date   TSH 0.904 07/06/2015   No results for input(s): HGBA1C in the last 72 hours.   Recent Labs  07/06/15 0256 07/07/15 0259 07/08/15 0646  PROT  --   --  6.8  ALBUMIN 3.3* 3.2* 3.1*  3.1*  AST  --   --  46*  ALT  --   --  29  ALKPHOS  --   --  56  BILITOT  --   --  0.7    Recent Labs  07/08/15 0646  INR 1.11   BNP (last 3 results)  Recent Labs  07/04/15 1456  BNP 663.6*    ProBNP (last 3 results) No results for input(s): PROBNP in the last 8760 hours.   Lipid Panel     Component Value Date/Time   CHOL 160 07/07/2015 0259   TRIG 119 07/07/2015 0259   HDL 40* 07/07/2015 0259   CHOLHDL 4.0 07/07/2015 0259   VLDL 24  07/07/2015 0259   LDLCALC 96 07/07/2015 0259      Imaging:  Urgent Cardiac Cath on 07/04/2015 Conclusion     Mid LAD lesion, 60% stenosed.  Prox Cx to Mid Cx lesion, 99% stenosed. The lesion was previously treated with a stent (unknown type).  Ost Cx to Prox Cx lesion, 75% stenosed.  Mid RCA lesion, 85% stenosed. Post intervention, there is a 0% residual stenosis.  Ost RPDA lesion, 99% stenosed. Post intervention, there is a 0% residual stenosis.  Multivessel CAD with diffusely irregular LAD system with 60% stenosis between the first and second diagonal vessel, subtotal/chronic total occlusion of the previously placed tandem circumflex stents with antegrade filling distally with TIMI 2 flow and small caliber diffusely narrowed ostial proximal circumflex prior to the stented segment; and a dominant RCA with eccentric 80-85% mid stenoses and 99% ostial PDA stenosis.  Successful PCI with ultimate insertion of a 2.514 mm Resolute DES stent into the PDA vessel, postdilated to 2.62 mm with a 99% stenosis being reduced to 0% and no impairment to flow in the jailed continuation branch/PLA vessel, and angiosculpt scoring balloon/DES stenting with a 2.518 mm Resolute stent postdilated to 2.75 mm in the mid RCA with the 85% stenosis being reduced to 0%.  LV pressure 143/28/37  RECOMMENDATION: Continued DAPT indefinitely The patient will continue with bivalirudin for several hours post procedure, particularly in light of the residual subtotal circumflex stenosis. A 2-D echo Doppler study will be obtained to assess LV function. Renal function will be closely monitored. Patient is at risk for subsequent dialysis. If viability is present in the circumflex territory, consider staged intervention of this subtotal/possible chronic total occlusion.         ------------------------------------------------------------------- 07/06/15 ECHO Study Conclusions  - Procedure narrative:  Transthoracic echocardiography. Image  quality was poor. The study was technically difficult, as a  result of poor acoustic windows, poor sound wave transmission,  and breast implants. - Left ventricle: The cavity size was normal. There was moderate  concentric hypertrophy. Systolic function was mildly to  moderately reduced. The estimated ejection fraction  was in the  range of 40% to 45%. Hypokinesis of the mid-apicalanterolateral  and inferolateral myocardium; consistent with ischemia in the  distribution of the left circumflex coronary artery. Possible  hypokinesis of the mid-apicalanterior myocardium; in the  distribution of the left anterior descending coronary artery.  (Anterior wall not as well visualized). Features are consistent  with a pseudonormal left ventricular filling pattern, with  concomitant abnormal relaxation and increased filling pressure  (grade 2 diastolic dysfunction). - Mitral valve: There was mild regurgitation. - Left atrium: The atrium was mildly dilated.  Duplex Final Summary:  - The vertebral arteries appear patent with antegrade flow. - Abnormal right subclavian artery. - Findings consistent with 1-39 percent stenosis involving the  right internal carotid artery and the left internal carotid  artery.  Assessment/Plan:   Principal Problem:   NSTEMI (non-ST elevated myocardial infarction) ([place]) Active Problems:   Hyperlipemia   Essential hypertension   CORONARY ATHEROSCLEROSIS NATIVE CORONARY ARTERY   Type 2 diabetes mellitus with stage 4 chronic kidney disease (HCC)   Acute CHF (congestive heart failure) (HCC)   Acute renal failure superimposed on stage 4 chronic kidney disease (HCC)   Elevated d-dimer   Acute on chronic respiratory failure with hypoxia and hypercapnia (HCC)   Subclavian artery stenosis, right   1. Day 4 s/p PCI to subtotal ostial PDA stenosis and mid RCA with DES stenting.  No recurrent chest pain or dyspnea.  Subtotal /chronic CTO of LCX stent with faint antegrade flow. With treat medically present with renal issues; no angina on meds  2. CKD: Cr pre-procedure was 2.9, initially improved with hydration to 2.5 ->2.73 -> 3.2 today -> 2.57; seems to have turned the corner.   3. Reduced pulse R arm;  Pt had reduced pulse at cath so radial artery approach was not attempted;  Lesion in subclavian with antegrade in vertebral and nl carotid flow. May ultimately need PV intervention if persists and renal fxn has remained stable.  3. Macrocytic anemia: B12 1074; folate 15   H/H today 8.7/27.5  4. HTN:  BP improved on hydralazine; will titrate to 37.5 mg  Every 8 hrs today.  5. 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 HLD: on crestor 20 mg;  LDL 96; increased crestor to 40 mg  7. DM  Will transfer to telemetry today.  Titrate hydralzine; tolerating BB and increased nitrates; on DAPT. On heparin started yesterday with reduced R arm flow. Await vascular f/u eval today.  Troy Sine, MD, [facility] 07/08/2015, 9:02 AM

## 2015-07-08 NOTE — Progress Notes (Signed)
CARDIAC REHAB PHASE I   PRE:  Rate/Rhythm: 65 SR    BP: sitting 143/67    SaO2: 98 2L, 98 RA  MODE:  Ambulation: 210 ft   POST:  Rate/Rhythm: 83 SR    BP: sitting 149/86     SaO2: 97 RA  Pt c/o right kneecap pain this am. She attributes it to her normal arthritis but worse. Needed encouragement to attempt walking. Actually able to walk fairly steady with RW, min assist with gait belt. Stated knee pain wasn't as bad as she thought it was going to be. She did begin to fatigue with distance, especially in right hand/arm. It has been numb and as she fatigued she lost control to grip RW. After walking she c/o right arm/elbow pain. To recliner with VSS. Reapplied ice to knee. Asking for more tylenol. 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 Will f/u. [id number]  [place], ACSM 07/08/2015 10:32 AM

## 2015-07-08 NOTE — Progress Notes (Signed)
PROGRESS NOTE                                                                                                                                                                                                             Patient Demographics:    [name], is a 70 y.o. female, DOB - February 25, 1946, [id number]  Admit date - 07/04/2015   Admitting Physician Minus Breeding, MD  Outpatient Primary MD for the patient is Maggie Font, MD  LOS - 4  Outpatient Specialists: Cardiology Dr Bronson Ing Gastroenterology Dr. Oneida Alar Nephrology: Dr. Lorrene Reid   Chief complaint: Shortness of breath  Brief Narrative   70 y.o. female with a history of CAD S/P DES to circumflex lesion in 2008 (multivessel disease noted at that time involving the LAD, 1st diagonal; historicaly, she has had a preserved EF), IDDM, HTN, dyslipidemia, and GERD presented at [facility] ED with progressive shortness of breath and worsening cough. She was found to have NSTEMI and transferred to [facility]. She was also found to have flash pulmonary edema requiring BiPAP. Patient underwent cardiac catheterization with mid RCA and posterior PDA stenting. Hospital course prolonged due to worsened renal function and  finding of subclavian steal syndrome.    Subjective:   Patient denies further chest pain or shortness of breath but complains of numbness and weakness in her right hand   Assessment  & Plan :    Principal Problem:    NSTEMI (non-ST elevated myocardial infarction) ([place]) -Status post left heart catheterization, Mid RCA and posterior PDA stenting. -dual antiplatelet. ( aspirin and Brilinta) May require viability study in the LCx territory with a planned staged intervention for chronic total occlusion. Cardiology plan on medical management for now given her underlying renal function. Continue Crestor , Lopressor,  and Imdur.   Active Problems: Acute  decompensated combined systolic and diastolic CHF 2-D echo with EF of 40-43% and grade 2 diastolic dysfunction in the setting of ischemic cardiomyopathy. Diuresed with IV Lasix but given worsened renal function it was discontinued. She is currently euvolemic. Continue aspirin, beta blocker, hydralazine, Imdur and statin. Not on ACEi/ ARB due to renal disease.  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 Follows with Dr. Lorrene Reid. Renal function worsened with diuretics and post cardiac cath. Lasix  discontinued. Creatinine now improving to  normal. Renal following.   Possible right subclavian stenosis distal to vertebral artery Found incidentally prior to cath as patient has decreased radial pulsation. Possibly chronic (cath done through femoral for this time and in 2008). Abnormal waveform consult given artery seen on upper extremity Doppler. Vertebral arteries appear antegrade on carotid Doppler. Nonsignificant carotid stenosis. Vascular surgery recommends possible intervention once renal function improves. Recommend PT/OT at this time.  Multifocal airspace disease on chest x-ray Possibly due to pulmonary edema. No signs of pneumonia.  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 Currently managed on lower dose of Lantus. Needs aggressive blood glucose control.   Anemia of chronic disease Stable.  Right knee joint pain Possibly in the setting of osteoarthritis. No swelling or deformity.  PT evaluation.   Code Status : Full code  Family Communication  : Daughter is at bedside  Disposition Plan  : Home once symptoms resolve.  Barriers For Discharge :  Right hand numbness  Consults  :   Cardiology Vascular surgery Nephrology  Procedures  :  2-D echo Cardiac cath Right upper extremity Doppler Carotid Doppler  DVT Prophylaxis  : IV heparin (will conform with vascular surgery patient needs to continue IV heparin)  Lab Results    Component Value Date   PLT 153 07/08/2015    Antibiotics  :  NONE  Anti-infectives    None        Objective:   Filed Vitals:   07/08/15 0334 07/08/15 0820 07/08/15 1146 07/08/15 1200  BP: 120/57 130/66 124/58 120/60  Pulse:      Temp: 98.1 F (36.7 C) 98.5 F (36.9 C) 98.2 F (36.8 C)   TempSrc: Oral Oral Oral   Resp: 18 18 16    Height:      Weight: 102.1 kg (225 lb 1.4 oz)     SpO2: 99% 99% 98% 100%    Wt Readings from Last 3 Encounters:  07/08/15 102.1 kg (225 lb 1.4 oz)  06/30/15 102.059 kg (225 lb)  03/30/15 102.059 kg (225 lb)     Intake/Output Summary (Last 24 hours) at 07/08/15 1310 Last data filed at 07/08/15 1200  Gross per 24 hour  Intake 707.43 ml  Output   1450 ml  Net -742.57 ml     Physical Exam  Gen: not in distress HEENT: no pallor, moist mucosa, supple neck Chest: clear b/l, no added sounds CVS: N S1&S2, no murmurs, rubs or gallop GI: soft, NT, ND, BS+ Musculoskeletal: warm, no edema, Diminished right radial pulsation, mild tenderness in the right knee joint CNS: Alert and oriented, nonfocal    Data Review:    CBC  Recent Labs Lab 07/04/15 1456 07/05/15 0220 07/06/15 0256 07/06/15 0822 07/07/15 0259 07/08/15 0646  WBC 7.8 6.6 7.1 7.4 6.8 5.6  HGB 11.0* 9.8* 8.8* 9.8* 8.6* 8.7*  HCT 35.6* 30.6* 27.6* 31.3* 27.2* 27.5*  PLT 171 176  184 147* 169 150 153  MCV 102.0* 99.7 101.1* 101.6* 98.9 100.7*  MCH 31.5 31.9 32.2 31.8 31.3 31.9  MCHC 30.9 32.0 31.9 31.3 31.6 31.6  RDW 14.5 14.3 14.6 14.5 14.4 14.3  LYMPHSABS 0.9  --   --  1.2  --  0.9  MONOABS 0.6  --   --  0.7  --  0.4  EOSABS 0.0  --   --  0.1  --  0.2  BASOSABS 0.0  --   --  0.0  --  0.0    Chemistries   Recent Labs  Lab 07/04/15 1456 07/05/15 0220 07/06/15 0256 07/07/15 0259 07/08/15 0646  NA 136 138 138 131*  131* 133*  133*  K 4.3 3.7 3.8 4.5  4.4 4.0  4.0  CL 94* 97* 98* 93*  93* 93*  94*  CO2 26 28 27 25  26 28  28   GLUCOSE 496* 303* 143*  253*  261* 254*  259*  BUN 78* 78* 87* 96*  97* 91*  92*  CREATININE 2.97* 2.55* 2.73* 3.29*  3.20* 2.57*  2.62*  CALCIUM 9.8 9.3 9.1 8.8*  8.8* 8.8*  8.8*  MG  --   --  2.5*  --  2.9*  AST 107*  --   --   --  46*  ALT 42  --   --   --  29  ALKPHOS 71  --   --   --  56  BILITOT 0.6  --   --   --  0.7   ------------------------------------------------------------------------------------------------------------------  Recent Labs  07/07/15 0259  CHOL 160  HDL 40*  LDLCALC 96  TRIG 119  CHOLHDL 4.0    Lab Results  Component Value Date   HGBA1C 9.4* 07/04/2015   ------------------------------------------------------------------------------------------------------------------  Recent Labs  07/06/15 0822  TSH 0.904   ------------------------------------------------------------------------------------------------------------------  Recent Labs  07/06/15 0822  VITAMINB12 1074*  FOLATE 15.3  TIBC 329  IRON 25*  RETICCTPCT 1.9    Coagulation profile  Recent Labs Lab 07/04/15 1456 07/08/15 0646  INR 1.16 1.11    No results for input(s): DDIMER in the last 72 hours.  Cardiac Enzymes  Recent Labs Lab 07/04/15 1456 07/04/15 2155 07/05/15 0220  TROPONINI 17.33* 17.98* 24.26*   ------------------------------------------------------------------------------------------------------------------    Component Value Date/Time   BNP 663.6* 07/04/2015 1456    Inpatient Medications  Scheduled Meds: . amLODipine  5 mg Oral Daily  . aspirin EC  81 mg Oral Daily  . calcitRIOL  0.25 mcg Oral Once per day on Mon Wed Fri  . famotidine  20 mg Oral Daily  . ferrous sulfate  325 mg Oral Daily  . hydrALAZINE  37.5 mg Oral Q8H  . insulin aspart  0-20 Units Subcutaneous TID WC  . insulin glargine  50 Units Subcutaneous QHS  . isosorbide mononitrate  60 mg Oral Daily  . latanoprost  1 drop Both Eyes QHS  . metoprolol tartrate  50 mg Oral BID  . rosuvastatin  40  mg Oral QHS  . sodium chloride flush  3 mL Intravenous Q12H  . ticagrelor  90 mg Oral BID   Continuous Infusions: . heparin 950 Units/hr (07/08/15 0912)   PRN Meds:.sodium chloride, acetaminophen, ALPRAZolam, benzonatate, diazepam, HYDROcodone-acetaminophen, nitroGLYCERIN, ondansetron (ZOFRAN) IV, sodium chloride flush, traMADol, zolpidem  Micro Results Recent Results (from the past 240 hour(s))  MRSA PCR Screening     Status: None   Collection Time: 07/04/15  9:50 AM  Result Value Ref Range Status   MRSA by PCR NEGATIVE NEGATIVE Final    Comment:        The GeneXpert MRSA Assay (FDA approved for NASAL specimens only), is one component of a comprehensive MRSA colonization surveillance program. It is not intended to diagnose MRSA infection nor to guide or monitor treatment for MRSA infections.     Radiology Reports Dg Chest Port 1 View  07/05/2015  CLINICAL DATA:  70 year old female with history of shortness of breath. History of congestive heart failure. EXAM: PORTABLE CHEST 1 VIEW COMPARISON:  Chest x-ray 07/04/2015. FINDINGS: Aeration has  improved significantly compared to the prior study, although there continues to be patchy asymmetrically distributed airspace disease predominantly in the left lung base and throughout the right mid to lower lung. Small bilateral pleural effusions. Minimal cephalization of the pulmonary vasculature. Heart size is normal. Upper mediastinal contours are within normal limits. IMPRESSION: 1. Resolving multifocal airspace disease. This was relatively symmetric in appearance on the prior study, which may indicate resolving pulmonary edema, however, the appearance is far more asymmetric on today's study, and heart size is normal. Clinical correlation for multilobar bronchopneumonia is suggested. 2. Small bilateral pleural effusions. Electronically Signed   By: Vinnie Langton M.D.   On: 07/05/2015 10:44    Time Spent in minutes  25   Louellen Molder  M.D on 07/08/2015 at 1:10 PM  Between 7am to 7pm - Pager - [phone]  After 7pm go to [email or web address] - password [facility]  [facility] -  Office  [phone]

## 2015-07-08 NOTE — Progress Notes (Signed)
[place] for heparin Indication: possible subclavian occlusion  Allergies  Allergen Reactions  . Ace Inhibitors Cough    Pt can tolerate Tribenzor (and ARBs)  . Codeine Rash    Patient Measurements: Height: '5\' 7"'$  (170.2 cm) Weight: 225 lb 1.4 oz (102.1 kg) IBW/kg (Calculated) : 61.6  Vital Signs: Temp: 98.2 F (36.8 C) (05/03 1730) Temp Source: Oral (05/03 1730) BP: 127/51 mmHg (05/03 1730)  Labs:  Recent Labs  07/06/15 0256 07/06/15 6283 07/07/15 0259  07/07/15 2214 07/08/15 0646 07/08/15 1802  HGB 8.8* 9.8* 8.6*  --   --  8.7*  --   HCT 27.6* 31.3* 27.2*  --   --  27.5*  --   PLT 147* 169 150  --   --  153  --   LABPROT  --   --   --   --   --  14.5  --   INR  --   --   --   --   --  1.11  --   HEPARINUNFRC  --   --   --   < > 0.71* 0.70 0.56  CREATININE 2.73*  --  3.29*  3.20*  --   --  2.57*  2.62*  --   < > = values in this interval not displayed.  Estimated Creatinine Clearance: 25 mL/min (by C-G formula based on Cr of 2.57).  Assessment: 70 y.o. female admitted with NSTEMI s/p PCI 4/29, now with right arm pain/numbness and possible occlusion, for heparin. HL therapeutic HL 0.56 after most recent rate decrease to 950 units/hr. No issues with infusion.   Goal of Therapy:  Heparin level 0.3-0.7 units/ml Monitor platelets by anticoagulation protocol: Yes  Plan:  1. Continue Heparin gtt 950 units/hrr 2. Daily HL, CBC 3. Monitor for S&S of bleed   Vincenza Hews, PharmD, BCPS 07/08/2015, 7:38 PM Pager: [phone]

## 2015-07-08 NOTE — Progress Notes (Addendum)
  Progress Note    07/08/2015 12:27 PM Hospital Day 3  Subjective:  Says she can't grip with her hand    Filed Vitals:   07/08/15 0820 07/08/15 1146  BP: 130/66 124/58  Pulse:    Temp: 98.5 F (36.9 C) 98.2 F (36.8 C)  Resp: 18 16    Physical Exam: Extremities:  Weaker grip on right than left; unable to palpate right radial pulse.  CBC    Component Value Date/Time   WBC 5.6 07/08/2015 0646   RBC 2.73* 07/08/2015 0646   RBC 3.08* 07/06/2015 0822   HGB 8.7* 07/08/2015 0646   HCT 27.5* 07/08/2015 0646   PLT 153 07/08/2015 0646   MCV 100.7* 07/08/2015 0646   MCH 31.9 07/08/2015 0646   MCHC 31.6 07/08/2015 0646   RDW [name].3 07/08/2015 0646   LYMPHSABS 0.9 07/08/2015 0646   MONOABS 0.4 07/08/2015 0646   EOSABS 0.2 07/08/2015 0646   BASOSABS 0.0 07/08/2015 0646    BMET    Component Value Date/Time   NA 133* 07/08/2015 0646   NA 133* 07/08/2015 0646   NA 144 06/23/2015 1004   K 4.0 07/08/2015 0646   K 4.0 07/08/2015 0646   CL 93* 07/08/2015 0646   CL 94* 07/08/2015 0646   CO2 28 07/08/2015 0646   CO2 28 07/08/2015 0646   GLUCOSE 254* 07/08/2015 0646   GLUCOSE 259* 07/08/2015 0646   GLUCOSE 39* 06/23/2015 1004   BUN 91* 07/08/2015 0646   BUN 92* 07/08/2015 0646   BUN 67* 06/23/2015 1004   CREATININE 2.57* 07/08/2015 0646   CREATININE 2.62* 07/08/2015 0646   CREATININE 1.73* 10/08/2013 1024   CALCIUM 8.8* 07/08/2015 0646   CALCIUM 8.8* 07/08/2015 0646   GFRNONAA 18* 07/08/2015 0646   GFRNONAA 17* 07/08/2015 0646   GFRAA 21* 07/08/2015 0646   GFRAA 20* 07/08/2015 0646    INR    Component Value Date/Time   INR 1.11 07/08/2015 0646   Vascular lab studies 07/07/15: Carotid duplex and right upper extremity arterial duplex completed.   Preliminary report:  1. Carotid: Bilateral: 1-39% ICA stenosis. Vertebral artery flow is antegrade.   2. RUE arterial: Brachial pressures taken with in room monitor:  Right 52/51mmHg Left 145/43mmHg.  Abnormal waveform in the subclavian artery. Monophasic waveforms distally. Right vertebral is antegrade. Probable subclavian stenosis distal to the vertebral.  Intake/Output Summary (Last 24 hours) at 07/08/15 1227 Last data filed at 07/08/15 1100  Gross per 24 hour  Intake 596.83 ml  Output   1450 ml  Net -853.17 ml     Assessment/Plan:  70 y.o. [name] admitted with NSTEMI Hospital Day 3  -no recurrent chest pain/dyspnea and will be treated medically -CKD with creatinine at 2.5 today -she has probable subclavian stenosis distal to the vertebral.  The right vertebral is antegrade.  May need intervention in the future if renal function improves. -could benefit from OT/PT in the meantime.    Leontine Locket, PA-C [facility] [phone] 07/08/2015 12:27 PM  I have examined the patient, reviewed and agree with above. Discussed with patient and her husband present. Continues to have weakness in her right hand. Somewhat cooler than her left. 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 Renal function continues to be abnormal. Do not see any option other than limited arteriogram and possible endovascular treatment versus surgical bypass. We'll plan arteriogram tomorrow  Curt Jews, MD 07/08/2015 3:58 PM

## 2015-07-08 NOTE — Progress Notes (Signed)
ANTICOAGULATION CONSULT NOTE - Follow Up Consult  Pharmacy Consult for heparin Indication: possible subclavian occlusion  Allergies  Allergen Reactions  . Ace Inhibitors Cough    Pt can tolerate Tribenzor (and ARBs)  . Codeine Rash    Patient Measurements: Height: 5\' 7"  (170.2 cm) Weight: 225 lb 1.4 oz (102.1 kg) IBW/kg (Calculated) : 61.6  Vital Signs: Temp: 98.5 F (36.9 C) (05/03 0820) Temp Source: Oral (05/03 0820) BP: 130/66 mmHg (05/03 0820) Pulse Rate: 68 (05/02 2123)  Labs:  Recent Labs  07/06/15 0256 07/06/15 [id number] 07/07/15 0259 07/07/15 1211 07/07/15 2214 07/08/15 0646  HGB 8.8* 9.8* 8.6*  --   --  8.7*  HCT 27.6* 31.3* 27.2*  --   --  27.5*  PLT 147* 169 150  --   --  153  LABPROT  --   --   --   --   --  14.5  INR  --   --   --   --   --  1.11  HEPARINUNFRC  --   --   --  1.04* 0.71* 0.70  CREATININE 2.73*  --  3.29*  3.20*  --   --  2.57*  2.62*    Estimated Creatinine Clearance: 25 mL/min (by C-G formula based on Cr of 2.57).  Assessment:  70 y.o. female admitted with NSTEMI s/p PCI 4/29, now with right arm pain/numbness and possible occlusion, for heparin. HL therapeutic 0.7 on 1000 units/hr. CBC stable.  Goal of Therapy:  Heparin level 0.3-0.7 units/ml Monitor platelets by anticoagulation protocol: Yes  Plan:  Heparin gtt 950 units/hr Confirmatory HL in 8 hr Daily HL, CBC Monitor for S&S of bleed  Angela Burke, PharmD Pharmacy Resident Pager: [phone] 07/08/2015,9:03 AM

## 2015-07-08 NOTE — Progress Notes (Signed)
07/08/2015 5:27 PM Received pt to room 2W09 from [facility].  Pt is A&O, no c/o voiced.  Tele monitor applied and CCMD notified.  Oriented to room, call light and bed.  Call bell in reach. [place]

## 2015-07-09 ENCOUNTER — Encounter (HOSPITAL_COMMUNITY): Payer: Self-pay | Admitting: Vascular Surgery

## 2015-07-09 ENCOUNTER — Encounter (HOSPITAL_COMMUNITY)
Admission: AD | Disposition: A | Discharge status: Home / Self Care | Payer: Self-pay | Source: Other Acute Inpatient Hospital | Attending: Internal Medicine

## 2015-07-09 DIAGNOSIS — I998 Other disorder of circulatory system: Secondary | ICD-10-CM

## 2015-07-09 DIAGNOSIS — D62 Acute posthemorrhagic anemia: Secondary | ICD-10-CM

## 2015-07-09 HISTORY — PX: PERIPHERAL VASCULAR CATHETERIZATION: SHX172C

## 2015-07-09 LAB — GLUCOSE, CAPILLARY
GLUCOSE-CAPILLARY: 110 mg/dL — AB (ref 65–99)
GLUCOSE-CAPILLARY: 98 mg/dL (ref 65–99)
GLUCOSE-CAPILLARY: 146 mg/dL — AB (ref 65–99)
GLUCOSE-CAPILLARY: 168 mg/dL — AB (ref 65–99)
Glucose-Capillary: 191 mg/dL — ABNORMAL HIGH (ref 65–99)

## 2015-07-09 LAB — BASIC METABOLIC PANEL
ANION GAP: 11 (ref 5–15)
BUN: 86 mg/dL — AB (ref 6–20)
CO2: 28 mmol/L (ref 22–32)
CREATININE: 2.39 mg/dL — AB (ref 0.44–1.00)
Calcium: 8.8 mg/dL — ABNORMAL LOW (ref 8.9–10.3)
Chloride: 96 mmol/L — ABNORMAL LOW (ref 101–111)
GFR calc Af Amer: 23 mL/min — ABNORMAL LOW (ref >60)
GFR, EST NON AFRICAN AMERICAN: 19 mL/min — AB (ref >60)
GLUCOSE: 257 mg/dL — AB (ref 65–99)
POTASSIUM: 3.9 mmol/L (ref 3.5–5.1)
Sodium: 135 mmol/L (ref 135–145)

## 2015-07-09 LAB — RENAL FUNCTION PANEL
ANION GAP: 12 (ref 5–15)
Albumin: 2.9 g/dL — ABNORMAL LOW (ref 3.5–5.0)
BUN: 87 mg/dL — ABNORMAL HIGH (ref 6–20)
CO2: 27 mmol/L (ref 22–32)
Calcium: 8.6 mg/dL — ABNORMAL LOW (ref 8.9–10.3)
Chloride: 95 mmol/L — ABNORMAL LOW (ref 101–111)
Creatinine, Ser: 2.43 mg/dL — ABNORMAL HIGH (ref 0.44–1.00)
GFR calc Af Amer: 22 mL/min — ABNORMAL LOW (ref >60)
GFR calc non Af Amer: 19 mL/min — ABNORMAL LOW (ref >60)
GLUCOSE: 254 mg/dL — AB (ref 65–99)
POTASSIUM: 4 mmol/L (ref 3.5–5.1)
Phosphorus: 3.8 mg/dL (ref 2.5–4.6)
Sodium: 134 mmol/L — ABNORMAL LOW (ref 135–145)

## 2015-07-09 LAB — CBC
HCT: 28.4 % — ABNORMAL LOW (ref 36.0–46.0)
HEMATOCRIT: 20.9 % — AB (ref 36.0–46.0)
HEMOGLOBIN: 6.6 g/dL — AB (ref 12.0–15.0)
HEMOGLOBIN: 8.7 g/dL — AB (ref 12.0–15.0)
MCH: 32.4 pg (ref 26.0–34.0)
MCH: 30.5 pg (ref 26.0–34.0)
MCHC: 31.6 g/dL (ref 30.0–36.0)
MCHC: 30.6 g/dL (ref 30.0–36.0)
MCV: 102.5 fL — ABNORMAL HIGH (ref 78.0–100.0)
MCV: 99.6 fL (ref 78.0–100.0)
PLATELETS: 160 10*3/uL (ref 150–400)
Platelets: 160 10*3/uL (ref 150–400)
RBC: 2.04 MIL/uL — ABNORMAL LOW (ref 3.87–5.11)
RBC: 2.85 MIL/uL — ABNORMAL LOW (ref 3.87–5.11)
RDW: 14.4 % (ref 11.5–15.5)
RDW: 14.4 % (ref 11.5–15.5)
WBC: 6 10*3/uL (ref 4.0–10.5)
WBC: 6.6 10*3/uL (ref 4.0–10.5)

## 2015-07-09 LAB — POCT ACTIVATED CLOTTING TIME: ACTIVATED CLOTTING TIME: 142 s

## 2015-07-09 LAB — HEPARIN LEVEL (UNFRACTIONATED): Heparin Unfractionated: 0.44 IU/mL (ref 0.30–0.70)

## 2015-07-09 LAB — CREATININE, SERUM
CREATININE: 2.26 mg/dL — AB (ref 0.44–1.00)
GFR calc Af Amer: 24 mL/min — ABNORMAL LOW (ref >60)
GFR, EST NON AFRICAN AMERICAN: 21 mL/min — AB (ref >60)

## 2015-07-09 LAB — PREPARE RBC (CROSSMATCH)

## 2015-07-09 SURGERY — UPPER EXTREMITY ANGIOGRAPHY
Laterality: Right

## 2015-07-09 MED ORDER — ACETAMINOPHEN 325 MG PO TABS
650.0000 mg | ORAL_TABLET | ORAL | Status: DC | PRN
  Administered 2015-07-11 – 2015-07-12 (×2): 650 mg via ORAL
  Filled 2015-07-09 (×2): qty 2

## 2015-07-09 MED ORDER — HEPARIN (PORCINE) IN NACL 2-0.9 UNIT/ML-% IJ SOLN
INTRAMUSCULAR | Status: DC | PRN
  Administered 2015-07-09: 1000 mL via INTRA_ARTERIAL

## 2015-07-09 MED ORDER — INSULIN ASPART 100 UNIT/ML ~~LOC~~ SOLN: 0.0000 [IU] | Freq: Every day | SUBCUTANEOUS | Status: DC

## 2015-07-09 MED ORDER — LIDOCAINE HCL (PF) 1 % IJ SOLN
INTRAMUSCULAR | Status: DC | PRN
  Administered 2015-07-09: 8 mL via SUBCUTANEOUS

## 2015-07-09 MED ORDER — FENTANYL CITRATE (PF) 100 MCG/2ML IJ SOLN
INTRAMUSCULAR | Status: AC
  Filled 2015-07-09: qty 2

## 2015-07-09 MED ORDER — FENTANYL CITRATE (PF) 100 MCG/2ML IJ SOLN
INTRAMUSCULAR | Status: DC | PRN
  Administered 2015-07-09: 25 ug via INTRAVENOUS

## 2015-07-09 MED ORDER — INSULIN GLARGINE 100 UNIT/ML ~~LOC~~ SOLN
70.0000 [IU] | Freq: Every day | SUBCUTANEOUS | Status: DC
  Administered 2015-07-09 – 2015-07-10 (×2): 70 [IU] via SUBCUTANEOUS
  Filled 2015-07-09 (×3): qty 0.7

## 2015-07-09 MED ORDER — DEXTROSE 5 % IV SOLN
1.5000 g | INTRAVENOUS | Status: AC
  Administered 2015-07-10: 1.5 g via INTRAVENOUS

## 2015-07-09 MED ORDER — IODIXANOL 320 MG/ML IV SOLN
INTRAVENOUS | Status: DC | PRN
  Administered 2015-07-09: 30 mL via INTRA_ARTERIAL

## 2015-07-09 MED ORDER — SODIUM CHLORIDE 0.9 % IV SOLN
1.0000 mL/kg/h | INTRAVENOUS | Status: AC
  Administered 2015-07-09: 1 mL/kg/h via INTRAVENOUS

## 2015-07-09 MED ORDER — HEPARIN (PORCINE) IN NACL 2-0.9 UNIT/ML-% IJ SOLN
INTRAMUSCULAR | Status: AC
  Filled 2015-07-09: qty 1000

## 2015-07-09 MED ORDER — HEPARIN SODIUM (PORCINE) 5000 UNIT/ML IJ SOLN
5000.0000 [IU] | Freq: Three times a day (TID) | INTRAMUSCULAR | Status: DC
  Administered 2015-07-09 – 2015-07-12 (×8): 5000 [IU] via SUBCUTANEOUS
  Filled 2015-07-09 (×10): qty 1

## 2015-07-09 MED ORDER — MIDAZOLAM HCL 2 MG/2ML IJ SOLN
INTRAMUSCULAR | Status: AC
  Filled 2015-07-09: qty 2

## 2015-07-09 MED ORDER — LIDOCAINE HCL (PF) 1 % IJ SOLN
INTRAMUSCULAR | Status: AC
  Filled 2015-07-09: qty 30

## 2015-07-09 MED ORDER — SODIUM CHLORIDE 0.9 % IV SOLN: Freq: Once | INTRAVENOUS | Status: AC

## 2015-07-09 MED ORDER — ONDANSETRON HCL 4 MG/2ML IJ SOLN
4.0000 mg | Freq: Four times a day (QID) | INTRAMUSCULAR | Status: DC | PRN
  Filled 2015-07-09: qty 2

## 2015-07-09 MED ORDER — INSULIN ASPART 100 UNIT/ML ~~LOC~~ SOLN
6.0000 [IU] | Freq: Three times a day (TID) | SUBCUTANEOUS | Status: DC
  Administered 2015-07-09 – 2015-07-10 (×2): 6 [IU] via SUBCUTANEOUS

## 2015-07-09 MED ORDER — INSULIN ASPART 100 UNIT/ML ~~LOC~~ SOLN
0.0000 [IU] | Freq: Three times a day (TID) | SUBCUTANEOUS | Status: DC
  Administered 2015-07-09 – 2015-07-10 (×2): 3 [IU] via SUBCUTANEOUS
  Administered 2015-07-11: 7 [IU] via SUBCUTANEOUS
  Administered 2015-07-11: 3 [IU] via SUBCUTANEOUS
  Administered 2015-07-12: 4 [IU] via SUBCUTANEOUS

## 2015-07-09 MED ORDER — MIDAZOLAM HCL 2 MG/2ML IJ SOLN
INTRAMUSCULAR | Status: DC | PRN
  Administered 2015-07-09: 0.5 mg via INTRAVENOUS

## 2015-07-09 SURGICAL SUPPLY — 11 items
CATH ANGIO 5F PIGTAIL 100CM (CATHETERS) ×3 IMPLANT
CATH HEADHUNTER H1 5F 100CM (CATHETERS) ×3 IMPLANT
COVER PRB 48X5XTLSCP FOLD TPE (BAG) ×1 IMPLANT
COVER PROBE 5X48 (BAG) ×2
KIT MICROINTRODUCER STIFF 5F (SHEATH) ×3 IMPLANT
KIT PV (KITS) ×3 IMPLANT
SHEATH PINNACLE 5F 10CM (SHEATH) ×3 IMPLANT
SYR MEDRAD MARK V 150ML (SYRINGE) ×3 IMPLANT
TRANSDUCER W/STOPCOCK (MISCELLANEOUS) ×3 IMPLANT
TRAY PV CATH (CUSTOM PROCEDURE TRAY) ×3 IMPLANT
WIRE BENTSON .035X145CM (WIRE) ×3 IMPLANT

## 2015-07-09 NOTE — Op Note (Signed)
OPERATIVE NOTE   PROCEDURE: 1.  Left common femoral artery cannulation under ultrasound guidance 2.  Placement of catheter in aorta 3.  Arch Aortogram 4.  Selection of right subclavian artery  5.  Right arm angiogram 6.  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 female who presents with right leg ischemia.  The patient presents for: arch aortogram and right arm angiogram.  I discussed with the patient the nature of angiographic procedures, especially the limited patencies of any endovascular intervention.  The patient is aware of that the risks of an angiographic procedure include but are not limited to: bleeding, infection, access site complications, renal failure, embolization, rupture of vessel, dissection, possible need for emergent surgical intervention, possible need for surgical procedures to treat the patient's pathology, and stroke and death.  The patient is aware of the risks and agrees to proceed.  DESCRIPTION: After full informed consent was obtained from the patient, the patient was brought back to the angiography suite.  The patient was placed supine upon the angiography table and connected to cardiopulmonary monitoring equipment.  The patient was then given conscious sedation, the amounts of which are documented in  the patient's chart.  A circulating radiologic technician maintained continuous monitoring of the patient's cardiopulmonary status.  Additionally, the control room radiologic technician provided backup monitoring throughout the procedure.  The patient was prepped and drape in the standard fashion for an angiographic procedure.  At this point, attention was turned to the left groin.  Under ultrasound guidance, the subcutaneous tissue surrounding the left common femoral artery was anesthesized with 1% lidocaine with epinephrine.  The artery was then cannulated with a micropuncture needle.  The microwire was advanced into the iliac arterial system.  The needle was exchanged for a microsheath, which was loaded into the common femoral artery over the wire.  The microwire was exchanged for a Bentson wire which was advanced into the aorta.  The microsheath was then exchanged for a 5-Fr sheath which was loaded into the common femoral artery. The long pigtail catheter was then loaded over the wire up to the level of ascending aorta.  The catheter was connected to the power injector circuit.  After de-airring and de-clotting the circuit, a power injector arch aortogram at 40 degree LAO was completed.  The findings are listed above.  On this image, a calcified silhouette in the distal subclavian artery is evident.  I pulled the wire and catheter into the descending thoracic aorta.  I exchanged the catheter for a H1 catheter.  I selected the innominate artery first then selected the right subclavian artery with minimal difficulty.  I then advanced the catheter into the artery.  The wire was removed and then and power injector circuit was connected to the H1 catheter.  An injection of the right subclavian artery proximal right arm was completed.  The findings are listed above.  I replaced the wire in the catheter and removed both from the left sheath.  The sheath was aspirated.  No clots were present and the sheath was reloaded  with heparinized saline.     COMPLICATIONS: none  CONDITION: stable  Adele Barthel, MD [facility] Office: [phone] Pager: [phone]  07/09/2015, 9:31 AM

## 2015-07-09 NOTE — H&P (View-Only) (Signed)
Patient name: [name] MRN: [id number] DOB: 15-Jul-1945 Sex: female     Reason for referral: Right arm ischemia  HISTORY OF PRESENT ILLNESS: Very complex 70 year old female admitted with myocardial infarction and critical coronary disease. Has long history of coronary disease. Also has a prior history of cervical disc disease and lumbar disc disease. The patient reports that for years she has had numbness in her right arm. Typically this is more positional than it is exertional. 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 No prior history of stroke. No other history of embolic disease and no history of atrial fibrillation. He went the catheterization via femoral approach and coronary stenting on 07/04/2015. Does have a history of stage IV chronic renal disease. He was noted to have more discomfort in her entire right arm yesterday and this was initially felt to be related to her typical symptoms of nerve type pain. Her hand is cooler on the right than on the left. She reports this improved through the day yesterday and this morning reports that it is at or near baseline.  Past Medical History  Diagnosis Date  . Hypertension   . Hyperlipidemia   . Diabetes mellitus   . Obesity   . GERD (gastroesophageal reflux disease)   . Heart murmur, systolic   . Anxiety   . Constipation   . Overactive bladder   . Allergic rhinitis   . Low back pain   . Osteoarthritis   . History of hysterectomy   . Acute CHF (congestive heart failure) ([place]) 07/04/2015  . Acute renal failure superimposed on stage 4 chronic kidney disease ([place]) 07/04/2015    Past Surgical History  Procedure Laterality Date  . Carpal tunnel release    . Trigger finger release    . Cervical biopsy      cervical lymph node biopsies  . Back surgery      multiple  . Coronary stent placement  04/11/2006    2 -- Taxus stents to the circumflex   . Cardiac catheterization  04/04/2006    Est EF of 60%  .  Abdominal hysterectomy    . Tendonitis      bilateral elbow  . Colonoscopy  May 2002    Dr. Irving Shows :Followup in 5 years, normal exam  . Colonoscopy  2008    Dr. Laural Golden: Very redundant colon with mild melanosis coli, splenic flexure polyp biopsy with acute complaint of benign colon polyp. Recommended ten-year followup  . Cardiac catheterization N/A 07/04/2015    Procedure: Left Heart Cath and Coronary Angiography;  Surgeon: Troy Sine, MD;  Location: [facility];  Service: Cardiovascular;  Laterality: N/A;  . Cardiac catheterization N/A 07/04/2015    Procedure: Coronary Stent Intervention;  Surgeon: Troy Sine, MD;  Location: [facility];  Service: Cardiovascular;  Laterality: N/A;    Social History   Social History  . Marital Status: Married    Spouse Name: N/A  . Number of Children: 3  . Years of Education: N/A   Occupational History  . Not on file.   Social History Main Topics  . Smoking status: Never Smoker   . Smokeless tobacco: Never Used  . Alcohol Use: No  . Drug Use: No  . Sexual Activity: Not on file   Other Topics Concern  . Not on file   Social History Narrative    Family History  Problem Relation Age of Onset  .  Diabetes Father   . Hypertension Father   . Stroke Father   . Hypertension Brother   . Aneurysm Brother   . Diabetes Brother   . Colon cancer Neg Hx     Allergies as of 07/04/2015 - Review Complete 07/04/2015  Allergen Reaction Noted  . Ace inhibitors Cough 04/11/2006  . Codeine Rash 01/20/2011    No current facility-administered medications on file prior to encounter.   Current Outpatient Prescriptions on File Prior to Encounter  Medication Sig Dispense Refill  . ALPRAZolam (XANAX) 0.5 MG tablet Take 0.5 mg by mouth at bedtime as needed for sleep.     Marland Kitchen aspirin EC 81 MG tablet Take 81 mg by mouth daily.    . benzonatate (TESSALON) 100 MG capsule Take 100 mg by mouth daily as needed for cough.    . calcitRIOL  (ROCALTROL) 0.25 MCG capsule Take 0.25 mcg by mouth every Monday, Wednesday, and Friday.     . clopidogrel (PLAVIX) 75 MG tablet TAKE 1 TABLET BY MOUTH EVERY DAY 30 tablet 6  . famotidine (PEPCID) 20 MG tablet Take 20 mg by mouth daily.    . ferrous sulfate 325 (65 FE) MG tablet Take 325 mg by mouth daily.    . Flax Oil-Fish Oil-Borage Oil (FISH OIL-FLAX OIL-BORAGE OIL) CAPS Take 2 capsules by mouth daily.    Marland Kitchen gabapentin (NEURONTIN) 100 MG capsule Take 100 mg by mouth daily as needed (pain).     . Insulin Degludec (TRESIBA FLEXTOUCH) 100 UNIT/ML SOPN Inject 70 Units into the skin at bedtime.    . insulin lispro (HUMALOG KWIKPEN) 100 UNIT/ML KiwkPen Inject 0.15-0.21 mLs (15-21 Units total) into the skin 3 (three) times daily. (Patient taking differently: Inject 15-21 Units into the skin 3 (three) times daily with meals. Per sliding scale) 15 mL 2  . latanoprost (XALATAN) 0.005 % ophthalmic solution Place 1 drop into both eyes at bedtime.     . meclizine (ANTIVERT) 12.5 MG tablet Take 12.5 mg by mouth 2 (two) times daily.     . metoprolol tartrate (LOPRESSOR) 25 MG tablet TAKE ONE TABLET BY MOUTH TWICE DAILY 60 tablet 6  . Olmesartan-Amlodipine-HCTZ (TRIBENZOR) 40-10-25 MG TABS Take 1 tablet by mouth daily. 30 tablet   . rosuvastatin (CRESTOR) 20 MG tablet Take 20 mg by mouth at bedtime.     . torsemide (DEMADEX) 20 MG tablet Take 20 mg by mouth 2 (two) times daily.     Marland Kitchen trimethoprim (TRIMPEX) 100 MG tablet Take 100 mg by mouth daily.     Marland Kitchen glucose blood (ACCU-CHEK AVIVA) test strip Use to check blood glucose 4 times a day. E11.65 150 each 5     REVIEW OF SYSTEMS:  Reviewed in her hospital chart with nothing to add  PHYSICAL EXAMINATION:  General: The patient is a well-nourished female, in no acute distress. Vital signs are BP 146/64 mmHg  Pulse 56  Temp(Src) 98 F (36.7 C) (Oral)  Resp 18  Ht 5\' 7"  (1.702 m)  Wt 234 lb 12.6 oz (106.5 kg)  BMI 36.76 kg/m2  SpO2 100% Pulmonary:  There is a good air exchange  Abdomen: Soft and non-tender  Musculoskeletal: There are no major deformities.  There is no significant extremity pain. Neurologic: She does have equal good grip strength bilaterally. She does report that there may be some mild paresthesia in her right hand. Skin: There are no ulcer or rashes noted. Psychiatric: The patient has normal affect. Cardiovascular: Easily palpable left radial pulse.  Absent radial pulse and absent ulnar pulse on the right. She does have audible Doppler flow in her right radial and right brachial. This is very dampened.    Impression and Plan:  Reduce flow to her right arm. This in all likelihood is chronic with possible acute exacerbation. Unclear as to why there would be any change. She was not a catheterized to the right radial approach. Had femoral catheterization. There was no arch study due to concern about limiting contrast. There is no contraindication to IV heparin solution this has been initiated. We will obtain noninvasive studies today for further identification of level of occlusive disease. Patient continues to have coronary lesions with consideration for repeat cath and repeat angioplasty. Could obtain innominate or right subclavian injection at the time of this cath for better definition as well. No indication for emergent surgery currently. Would be at some increased risk due to her cardiac disease. Will follow with you.    Curt Jews [facility] Office: [phone]

## 2015-07-09 NOTE — Progress Notes (Addendum)
Pt hgb 6.6 this AM.  NP Baltazar Najjar paged/notified x2.  No new orders at this time.  Pt is currently resting comfortably with call bell within reach.  RN will continue to monitor pt closely.  Claudette Stapler, RN

## 2015-07-09 NOTE — Progress Notes (Addendum)
Subjective:  Feels much better; no chest pain or SOB  Objective:   Vital Signs : Filed Vitals:   07/08/15 1200 07/08/15 1730 07/08/15 2036 07/09/15 0400  BP: 120/60 127/51 124/55 118/51  Pulse:   71 58  Temp:  98.2 F (36.8 C) 98.1 F (36.7 C) 98.6 F (37 C)  TempSrc:  Oral Oral Oral  Resp:   18 18  Height:      Weight:    229 lb 11.5 oz (104.2 kg)  SpO2: 100% 100% 100% 100%    Intake/Output from previous day:  Intake/Output Summary (Last 24 hours) at 07/09/15 0843 Last data filed at 07/08/15 1700  Gross per 24 hour  Intake  231.1 ml  Output    450 ml  Net -218.9 ml    I/O since admission: -4985  Wt Readings from Last 3 Encounters:  07/09/15 229 lb 11.5 oz (104.2 kg)  06/30/15 225 lb (102.059 kg)  03/30/15 225 lb (102.059 kg)    Medications: . amLODipine  5 mg Oral Daily  . aspirin EC  81 mg Oral Daily  . calcitRIOL  0.25 mcg Oral Once per day on Mon Wed Fri  . famotidine  20 mg Oral Daily  . ferrous sulfate  325 mg Oral Daily  . hydrALAZINE  37.5 mg Oral Q8H  . insulin aspart  0-20 Units Subcutaneous TID WC  . insulin aspart  4 Units Subcutaneous TID WC  . insulin glargine  60 Units Subcutaneous QHS  . isosorbide mononitrate  60 mg Oral Daily  . latanoprost  1 drop Both Eyes QHS  . metoprolol tartrate  50 mg Oral BID  . rosuvastatin  40 mg Oral QHS  . sodium chloride flush  3 mL Intravenous Q12H  . ticagrelor  90 mg Oral BID    . heparin 950 Units/hr (07/08/15 1919)    Physical Exam:   BP LEFT ARM: 145/68 ; BP RIGHT ARM: 52/25 during vascular doppler evaluation on 07/07/15  General appearance: alert, cooperative and no distress Neck: no adenopathy, no JVD, supple, symmetrical, trachea midline and thyroid not enlarged, symmetric, no tenderness/mass/nodules Lungs: clear to auscultation bilaterally Heart: regular rate and rhythm and 1/6 systolic murmur; no s3; no rub Abdomen: soft, non-tender; bowel sounds normal; no masses,  no organomegaly R  groin cath site stable; no hematoma or ecchymosis Extremities: no edema, redness or tenderness in the calves or thighs Pulses: decreased pulse R arm Skin: Skin color, texture, turgor normal. No rashes or lesions Neurologic: Grossly normal   Rate: 68  Rhythm: normal sinus rhythm  ECG (independently read by me): NSR at 78; nonspecific T changes  Lab Results:   Recent Labs  07/07/15 0259 07/08/15 0646 07/09/15 0242  NA 131*  131* 133*  133* 135  134*  K 4.5  4.4 4.0  4.0 3.9  4.0  CL 93*  93* 93*  94* 96*  95*  CO2 '25  26 28  28 28  27  '$ GLUCOSE 253*  261* 254*  259* 257*  254*  BUN 96*  97* 91*  92* 86*  87*  CREATININE 3.29*  3.20* 2.57*  2.62* 2.39*  2.43*  CALCIUM 8.8*  8.8* 8.8*  8.8* 8.8*  8.6*  MG  --  2.9*  --   PHOS 5.4* 3.8 3.8    Hepatic Function Latest Ref Rng 07/09/2015 07/08/2015 07/08/2015  Total Protein 6.5 - 8.1 g/dL - 6.8 -  Albumin 3.5 - 5.0 g/dL 2.9(L) 3.1(L) 3.1(L)  AST 15 - 41 U/L - 46(H) -  ALT 14 - 54 U/L - 29 -  Alk Phosphatase 38 - 126 U/L - 56 -  Total Bilirubin 0.3 - 1.2 mg/dL - 0.7 -     Recent Labs  07/07/15 0259 07/08/15 0646 07/09/15 0242  WBC 6.8 5.6 6.0  NEUTROABS  --  4.1  --   HGB 8.6* 8.7* 6.6*  HCT 27.2* 27.5* 20.9*  MCV 98.9 100.7* 102.5*  PLT 150 153 160    No results for input(s): TROPONINI in the last 72 hours.  Invalid input(s): CK, MB  Lab Results  Component Value Date   TSH 0.904 07/06/2015   No results for input(s): HGBA1C in the last 72 hours.   Recent Labs  07/07/15 0259 07/08/15 0646 07/09/15 0242  PROT  --  6.8  --   ALBUMIN 3.2* 3.1*  3.1* 2.9*  AST  --  46*  --   ALT  --  29  --   ALKPHOS  --  56  --   BILITOT  --  0.7  --     Recent Labs  07/08/15 0646  INR 1.11   BNP (last 3 results)  Recent Labs  07/04/15 1456  BNP 663.6*    ProBNP (last 3 results) No results for input(s): PROBNP in the last 8760 hours.   Lipid Panel     Component Value Date/Time    CHOL 160 07/07/2015 0259   TRIG 119 07/07/2015 0259   HDL 40* 07/07/2015 0259   CHOLHDL 4.0 07/07/2015 0259   VLDL 24 07/07/2015 0259   LDLCALC 96 07/07/2015 0259      Imaging:  Urgent Cardiac Cath on 07/04/2015 Conclusion     Mid LAD lesion, 60% stenosed.  Prox Cx to Mid Cx lesion, 99% stenosed. The lesion was previously treated with a stent (unknown type).  Ost Cx to Prox Cx lesion, 75% stenosed.  Mid RCA lesion, 85% stenosed. Post intervention, there is a 0% residual stenosis.  Ost RPDA lesion, 99% stenosed. Post intervention, there is a 0% residual stenosis.  Multivessel CAD with diffusely irregular LAD system with 60% stenosis between the first and second diagonal vessel, subtotal/chronic total occlusion of the previously placed tandem circumflex stents with antegrade filling distally with TIMI 2 flow and small caliber diffusely narrowed ostial proximal circumflex prior to the stented segment; and a dominant RCA with eccentric 80-85% mid stenoses and 99% ostial PDA stenosis.  Successful PCI with ultimate insertion of a 2.514 mm Resolute DES stent into the PDA vessel, postdilated to 2.62 mm with a 99% stenosis being reduced to 0% and no impairment to flow in the jailed continuation branch/PLA vessel, and angiosculpt scoring balloon/DES stenting with a 2.518 mm Resolute stent postdilated to 2.75 mm in the mid RCA with the 85% stenosis being reduced to 0%.  LV pressure 143/28/37  RECOMMENDATION: Continued DAPT indefinitely The patient will continue with bivalirudin for several hours post procedure, particularly in light of the residual subtotal circumflex stenosis. A 2-D echo Doppler study will be obtained to assess LV function. Renal function will be closely monitored. Patient is at risk for subsequent dialysis. If viability is present in the circumflex territory, consider staged intervention of this subtotal/possible chronic total occlusion.          ------------------------------------------------------------------- 07/06/15 ECHO Study Conclusions  - Procedure narrative: Transthoracic echocardiography. Image  quality was poor. The study was technically difficult, as a  result of poor acoustic windows, poor sound wave  transmission,  and breast implants. - Left ventricle: The cavity size was normal. There was moderate  concentric hypertrophy. Systolic function was mildly to  moderately reduced. The estimated ejection fraction was in the  range of 40% to 45%. Hypokinesis of the mid-apicalanterolateral  and inferolateral myocardium; consistent with ischemia in the  distribution of the left circumflex coronary artery. Possible  hypokinesis of the mid-apicalanterior myocardium; in the  distribution of the left anterior descending coronary artery.  (Anterior wall not as well visualized). Features are consistent  with a pseudonormal left ventricular filling pattern, with  concomitant abnormal relaxation and increased filling pressure  (grade 2 diastolic dysfunction). - Mitral valve: There was mild regurgitation. - Left atrium: The atrium was mildly dilated.  Duplex Final Summary:  - The vertebral arteries appear patent with antegrade flow. - Abnormal right subclavian artery. - Findings consistent with 1-39 percent stenosis involving the  right internal carotid artery and the left internal carotid  artery.  Assessment/Plan:   Principal Problem:   NSTEMI (non-ST elevated myocardial infarction) ([place]) Active Problems:   Hyperlipemia   Essential hypertension   CORONARY ATHEROSCLEROSIS NATIVE CORONARY ARTERY   Type 2 diabetes mellitus with stage 4 chronic kidney disease ([facility])   Acute CHF (congestive heart failure) ([facility])   Acute renal failure superimposed on stage 4 chronic kidney disease ([facility])   Elevated d-dimer   Acute on chronic respiratory failure with hypoxia and hypercapnia ([facility])   Subclavian artery  stenosis, right   1. Day 5 s/p PCI to subtotal ostial PDA stenosis and mid RCA with DES stenting.  No recurrent chest pain or dyspnea. Subtotal /chronic CTO of LCX stent with faint antegrade flow. With treat medically present with renal issues; no angina on meds  2. CKD: Cr pre-procedure was 2.9, initially improved with hydration to 2.5 ->2.73 -> 3.2 today -> 2.57 ->2.39;    3. Reduced pulse R arm;  Pt had reduced pulse at cath so radial artery approach was not attempted;  Lesion in subclavian with antegrade in vertebral and nl carotid flow.  Plan for innominate and right subclavian arteriogram today with limited dye today with Dr. Donnetta Hutching. Neurology to see for persistent motor weakness of Right hand.  3. Progressive anemia: B12 1074; folate 15   H/H yesterday  8.7/27.5;  H/H significantly decreased today to 6.6/20.9.  Was on heparin yesterday; will dc/ Check stool giuacs; Type and cross and transfuse PRBC  4. HTN:  BP improved on hydralazine; will titrate to 37.5 mg  Every 8 hrs today.  5. 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 HLD: on crestor 20 mg;  LDL 96; increased crestor to 40 mg  7. DM   Troy Sine, MD, [facility]  Addendum: Results of PV angio reviewed. Total occlusion of distal R subclavian. Needs 2 u PRBC transfusion. Hematoma at R groin. No flank tenderness. No back tenderness. Off heparin. ?Consider CT to make certain no retroperitoneal bleed if H/H remains low. 07/09/2015, 8:43 AM

## 2015-07-09 NOTE — Interval H&P Note (Signed)
    History and Physical Update  The patient was interviewed and re-examined.  The patient's previous History and Physical has been reviewed and is unchanged from Dr. Donnetta Hutching consult.  Per Dr. Donnetta Hutching, he feels the patient's sx are progressing and he believes there is a chronic occlusion of the right subclavian artery.  There is no change in the plan of care: Arch aortogram, right arm angiogram.   I discussed with the patient the nature of angiographic procedures, especially the limited patencies of any endovascular intervention.    The patient is aware of that the risks of an angiographic procedure include but are not limited to: bleeding, infection, access site complications, renal failure, embolization, rupture of vessel, dissection, arteriovenous fistula, possible need for emergent surgical intervention, possible need for surgical procedures to treat the patient's pathology, anaphylactic reaction to contrast, and stroke and death.    The patient is aware of the risks and agrees to proceed. Adele Barthel, MD [facility] Office: [phone] Pager: [phone]  07/09/2015, 8:46 AM

## 2015-07-09 NOTE — Progress Notes (Signed)
PROGRESS NOTE                                                                                                                                                                                                             Patient Demographics:    [name], is a 70 y.o. female, DOB - 1945-06-08, [id number]  Admit date - 07/04/2015   Admitting Physician Minus Breeding, MD  Outpatient Primary MD for the patient is Maggie Font, MD  LOS - 5  Outpatient Specialists: Cardiology Dr Bronson Ing Gastroenterology Dr. Oneida Alar Nephrology: Dr. Lorrene Reid   Chief complaint: Shortness of breath  Brief Narrative   70 y.o. female with a history of CAD S/P DES to circumflex lesion in 2008 (multivessel disease noted at that time involving the LAD, 1st diagonal; historicaly, she has had a preserved EF), IDDM, HTN, dyslipidemia, and GERD presented at [facility] ED with progressive shortness of breath and worsening cough. She was found to have NSTEMI and transferred to [facility]. She was also found to have flash pulmonary edema requiring BiPAP. Patient underwent cardiac catheterization with mid RCA and posterior PDA stenting. Hospital course prolonged due to worsened renal function and  finding of subclavian steal syndrome.    Subjective:      Assessment  & Plan :    Principal Problem:    NSTEMI (non-ST elevated myocardial infarction) ([place]) -Status post left heart catheterization, Mid RCA and posterior PDA stenting. -dual antiplatelet. ( aspirin and Brilinta) May require viability study in the LCx territory with a planned staged intervention for chronic total occlusion. Cardiology plan on medical management for now given her underlying renal function. Continue Crestor , Lopressor,  and Imdur.   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 Diuresed with IV Lasix but given worsened renal function it was discontinued. She is currently euvolemic. Continue aspirin, beta blocker, hydralazine, Imdur and statin. Not on ACEi/ ARB due to renal disease.  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 Follows with Dr. Lorrene Reid. Renal function worsened with diuretics and post cardiac cath. Lasix  discontinued. Creatinine now improving to normal. Renal following.   Distal right subclavian artery occlusion Found incidentally prior to cath as patient has  decreased radial pulsation. Patient reports numbness in her right hand for several weeks and now has weakness with some wristdrop in her right hand. Appears to be chronic total occlusion . Arteriogram done with her right arm angiogram which shows patent proximal right subclavian artery with distal right subclavian artery occlusion which is likely chronic.  Vertebral arteries appear antegrade on carotid Doppler. Nonsignificant carotid stenosis. Further recommendations per vascular surgery.  Continue with PT OT.  Acute on chronic anemia Drop in H&H of almost 2 g this morning. Transfuse 1 unit PRBC. Check stool for occult blood. IV heparin has been discontinued.  Multifocal airspace disease on chest x-ray Possibly due to pulmonary edema. No signs of pneumonia.  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 CBG elevated. Will increase her Lantus dose back to her home dose. Added female coverage with NovoLog 6 units 3 times a day.   Right knee joint pain Possibly in the setting of osteoarthritis. No swelling or deformity is limited flexion due to pain. Check x-ray of the right knee for any effusion.   Code Status : Full code  Family Communication  : Daughter   at bedside  Disposition Plan  : Home once symptoms resolve.  Barriers For Discharge :  Right hand numbness  Consults  :   Cardiology Vascular  surgery Nephrology  Procedures  :  2-D echo Cardiac cath Right upper extremity Doppler Carotid Doppler Right arm angiogram  DVT Prophylaxis  : Subcutaneous heparin   Lab Results  Component Value Date   PLT 160 07/09/2015    Antibiotics  :  NONE  Anti-infectives    None        Objective:   Filed Vitals:   07/09/15 1230 07/09/15 1238 07/09/15 1247 07/09/15 1305  BP: 156/64 156/64 147/69 137/51  Pulse: 74 74 76 69  Temp:  97.8 F (36.6 C)  98.5 F (36.9 C)  TempSrc:  Axillary  Oral  Resp:  14    Height:      Weight:      SpO2: 100% 100% 99% 100%    Wt Readings from Last 3 Encounters:  07/09/15 104.2 kg (229 lb 11.5 oz)  06/30/15 102.059 kg (225 lb)  03/30/15 102.059 kg (225 lb)     Intake/Output Summary (Last 24 hours) at 07/09/15 1321 Last data filed at 07/09/15 1238  Gross per 24 hour  Intake  127.5 ml  Output    150 ml  Net  -22.5 ml     Physical Exam  Gen: not in distress HEENT: no pallor, moist mucosa, supple neck Chest: clear b/l, no added sounds CVS: N S1&S2, no murmurs, rubs or gallop GI: soft, NT, ND, BS+ Musculoskeletal: warm, no edema, Diminished right radial pulsation With weakness in right hand and some pus drop. tenderness in the right knee joint CNS: Alert and oriented, nonfocal    Data Review:    CBC  Recent Labs Lab 07/04/15 1456  07/06/15 0256 07/06/15 0822 07/07/15 0259 07/08/15 0646 07/09/15 0242  WBC 7.8  < > 7.1 7.4 6.8 5.6 6.0  HGB 11.0*  < > 8.8* 9.8* 8.6* 8.7* 6.6*  HCT 35.6*  < > 27.6* 31.3* 27.2* 27.5* 20.9*  PLT 171  < > 147* 169 150 153 160  MCV 102.0*  < > 101.1* 101.6* 98.9 100.7* 102.5*  MCH 31.5  < > 32.2 31.8 31.3 31.9 32.4  MCHC 30.9  < > 31.9 31.3 31.6 31.6 31.6  RDW 14.5  < >  14.6 14.5 14.4 14.3 14.4  LYMPHSABS 0.9  --   --  1.2  --  0.9  --   MONOABS 0.6  --   --  0.7  --  0.4  --   EOSABS 0.0  --   --  0.1  --  0.2  --   BASOSABS 0.0  --   --  0.0  --  0.0  --   < > = values in this  interval not displayed.  Chemistries   Recent Labs Lab 07/04/15 1456 07/05/15 0220 07/06/15 0256 07/07/15 0259 07/08/15 0646 07/09/15 0242  NA 136 138 138 131*  131* 133*  133* 135  134*  K 4.3 3.7 3.8 4.5  4.4 4.0  4.0 3.9  4.0  CL 94* 97* 98* 93*  93* 93*  94* 96*  95*  CO2 26 28 27 25  26 28  28 28  27   GLUCOSE 496* 303* 143* 253*  261* 254*  259* 257*  254*  BUN 78* 78* 87* 96*  97* 91*  92* 86*  87*  CREATININE 2.97* 2.55* 2.73* 3.29*  3.20* 2.57*  2.62* 2.39*  2.43*  CALCIUM 9.8 9.3 9.1 8.8*  8.8* 8.8*  8.8* 8.8*  8.6*  MG  --   --  2.5*  --  2.9*  --   AST 107*  --   --   --  46*  --   ALT 42  --   --   --  29  --   ALKPHOS 71  --   --   --  56  --   BILITOT 0.6  --   --   --  0.7  --    ------------------------------------------------------------------------------------------------------------------  Recent Labs  07/07/15 0259  CHOL 160  HDL 40*  LDLCALC 96  TRIG 119  CHOLHDL 4.0    Lab Results  Component Value Date   HGBA1C 9.4* 07/04/2015   ------------------------------------------------------------------------------------------------------------------ No results for input(s): TSH, T4TOTAL, T3FREE, THYROIDAB in the last 72 hours.  Invalid input(s): FREET3 ------------------------------------------------------------------------------------------------------------------ No results for input(s): VITAMINB12, FOLATE, FERRITIN, TIBC, IRON, RETICCTPCT in the last 72 hours.  Coagulation profile  Recent Labs Lab 07/04/15 1456 07/08/15 0646  INR 1.16 1.11    No results for input(s): DDIMER in the last 72 hours.  Cardiac Enzymes  Recent Labs Lab 07/04/15 1456 07/04/15 2155 07/05/15 0220  TROPONINI 17.33* 17.98* 24.26*   ------------------------------------------------------------------------------------------------------------------    Component Value Date/Time   BNP 663.6* 07/04/2015 1456    Inpatient  Medications  Scheduled Meds: . amLODipine  5 mg Oral Daily  . aspirin EC  81 mg Oral Daily  . calcitRIOL  0.25 mcg Oral Once per day on Mon Wed Fri  . famotidine  20 mg Oral Daily  . ferrous sulfate  325 mg Oral Daily  . heparin  5,000 Units Subcutaneous Q8H  . hydrALAZINE  37.5 mg Oral Q8H  . insulin aspart  0-20 Units Subcutaneous TID WC  . insulin aspart  4 Units Subcutaneous TID WC  . insulin glargine  60 Units Subcutaneous QHS  . isosorbide mononitrate  60 mg Oral Daily  . latanoprost  1 drop Both Eyes QHS  . metoprolol tartrate  50 mg Oral BID  . rosuvastatin  40 mg Oral QHS  . sodium chloride flush  3 mL Intravenous Q12H  . ticagrelor  90 mg Oral BID   Continuous Infusions: . sodium chloride 1 mL/kg/hr (07/09/15 1045)   PRN Meds:.sodium chloride,  acetaminophen, ALPRAZolam, benzonatate, diazepam, nitroGLYCERIN, ondansetron (ZOFRAN) IV, sodium chloride flush, traMADol, zolpidem  Micro Results Recent Results (from the past 240 hour(s))  MRSA PCR Screening     Status: None   Collection Time: 07/04/15  9:50 AM  Result Value Ref Range Status   MRSA by PCR NEGATIVE NEGATIVE Final    Comment:        The GeneXpert MRSA Assay (FDA approved for NASAL specimens only), is one component of a comprehensive MRSA colonization surveillance program. It is not intended to diagnose MRSA infection nor to guide or monitor treatment for MRSA infections.     Radiology Reports Dg Chest Port 1 View  07/05/2015  CLINICAL DATA:  70 year old female with history of shortness of breath. History of congestive heart failure. EXAM: PORTABLE CHEST 1 VIEW COMPARISON:  Chest x-ray 07/04/2015. FINDINGS: Aeration has improved significantly compared to the prior study, although there continues to be patchy asymmetrically distributed airspace disease predominantly in the left lung base and throughout the right mid to lower lung. Small bilateral pleural effusions. Minimal cephalization of the pulmonary  vasculature. Heart size is normal. Upper mediastinal contours are within normal limits. IMPRESSION: 1. Resolving multifocal airspace disease. This was relatively symmetric in appearance on the prior study, which may indicate resolving pulmonary edema, however, the appearance is far more asymmetric on today's study, and heart size is normal. Clinical correlation for multilobar bronchopneumonia is suggested. 2. Small bilateral pleural effusions. Electronically Signed   By: Vinnie Langton M.D.   On: 07/05/2015 10:44    Time Spent in minutes  25   Louellen Molder M.D on 07/09/2015 at 1:21 PM  Between 7am to 7pm - Pager - [phone]  After 7pm go to [email or web address] - password [facility]  [facility] -  Office  [phone]

## 2015-07-09 NOTE — Progress Notes (Signed)
Inpatient Diabetes Program Recommendations  AACE/ADA: New Consensus Statement on Inpatient Glycemic Control (2015)  Target Ranges:  Prepandial:   less than 140 mg/dL      Peak postprandial:   less than 180 mg/dL (1-2 hours)      Critically ill patients:  140 - 180 mg/dL  Results for [name], [name] (MRN [id number]) as of 07/09/2015 11:23  Ref. Range 07/08/2015 11:50 07/08/2015 18:47 07/08/2015 21:06 07/09/2015 06:38 07/09/2015 10:05  Glucose-Capillary Latest Ref Range: 65-99 mg/dL 203 (H) 205 (H) 291 (H) 191 (H) 110 (H)   Review of Glycemic Control  Diabetes history: DM2 Outpatient Diabetes medications: Tresiba 70 units QHS, Humalog 15-21 units TID with meals Current orders for Inpatient glycemic control: Lantus 50 units QHS, Novolog 0-20 units TID with meals  Inpatient Diabetes Program Recommendations: When diet resumed: Correction (SSI): Please consider ordering Novolog bedtime correction scale. Insulin - Meal Coverage: Please consider ordering Novolog 6 units TID with meals for meal coverage.  Thank you, Nani Gasser. Kadie Balestrieri, RN, MSN, CDE Inpatient Glycemic Control Team Team Pager [phone] (8am-5pm) 07/09/2015 11:24 AM

## 2015-07-09 NOTE — Progress Notes (Signed)
Pt has arrived from cath lab. Left groin access is level 0. Pt is on bedrest for 4 hours; bedrest will end at 1430. Pt's vitals are stable. Telemetry box has been re-applied. Pt is resting in bed with call-light within reach. Will continue to monitor.   Grant Fontana RN, BSN

## 2015-07-09 NOTE — Consult Note (Signed)
NEURO HOSPITALIST CONSULT NOTE   Requestig physician: Dr. Clementeen Graham   Reason for Consult: weak right arm    HPI:                                                                                                                                          [name] is an 70 y.o. 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 Tuesday night felt sudden onset of weakness and numbness in right arm distally greater than proximally. Went for a Catheterization to evaluate subclavian artery and found a distal occlusion. Neuro asked to see for consideration if this was neurological.   Past Medical History  Diagnosis Date  . Hypertension   . Hyperlipidemia   . Diabetes mellitus   . Obesity   . GERD (gastroesophageal reflux disease)   . Heart murmur, systolic   . Anxiety   . Constipation   . Overactive bladder   . Allergic rhinitis   . Low back pain   . Osteoarthritis   . History of hysterectomy   . Acute CHF (congestive heart failure) ([place]) 07/04/2015  . Acute renal failure superimposed on stage 4 chronic kidney disease ([place]) 07/04/2015    Past Surgical History  Procedure Laterality Date  . Carpal tunnel release    . Trigger finger release    . Cervical biopsy      cervical lymph node biopsies  . Back surgery      multiple  . Coronary stent placement  04/11/2006    2 -- Taxus stents to the circumflex   . Cardiac catheterization  04/04/2006    Est EF of 60%  . Abdominal hysterectomy    . Tendonitis      bilateral elbow  . Colonoscopy  May 2002    Dr. Irving Shows :Followup in 5 years, normal exam  . Colonoscopy  2008    Dr. Laural Golden: Very redundant colon with mild melanosis coli, splenic flexure polyp biopsy with acute complaint of benign colon polyp. Recommended ten-year followup  . Cardiac catheterization N/A 07/04/2015    Procedure: Left Heart Cath and Coronary Angiography;  Surgeon: Troy Sine, MD;  Location: [facility];  Service: Cardiovascular;  Laterality: N/A;  . Cardiac catheterization N/A 07/04/2015    Procedure: Coronary Stent Intervention;  Surgeon: Troy Sine, MD;  Location: [facility];  Service: Cardiovascular;  Laterality: N/A;    Family History  Problem Relation Age of Onset  . Diabetes Father   . Hypertension Father   . Stroke Father   . Hypertension Brother   . Aneurysm Brother   . Diabetes Brother   . Colon cancer Neg Hx      Social History:  reports that she has never smoked. She has never used smokeless tobacco. She reports that she does not drink alcohol or use illicit drugs.  Allergies  Allergen Reactions  . Ace Inhibitors Cough    Pt can tolerate Tribenzor (and ARBs)  . Codeine Rash    MEDICATIONS:                                                                                                                     Prior to Admission:  Prescriptions prior to admission  Medication Sig Dispense Refill Last Dose  . acetaminophen (TYLENOL) 500 MG tablet Take 2,000 mg by mouth every 6 (six) hours as needed (pain).   07/03/2015 at Unknown time  . ALPRAZolam (XANAX) 0.5 MG tablet Take 0.5 mg by mouth at bedtime as needed for sleep.    unknown  . aspirin EC 81 MG tablet Take 81 mg by mouth daily.   07/03/2015 at Unknown time  . benzonatate (TESSALON) 100 MG capsule Take 100 mg by mouth daily as needed for cough.   unknown  . calcitRIOL (ROCALTROL) 0.25 MCG capsule Take 0.25 mcg by mouth every Monday, Wednesday, and Friday.    07/03/2015 at Unknown time  . clopidogrel (PLAVIX) 75 MG tablet TAKE 1 TABLET BY MOUTH EVERY DAY 30 tablet 6 07/03/2015 at Unknown time  . famotidine (PEPCID) 20 MG tablet Take 20 mg by mouth daily.   07/03/2015 at Unknown time  . ferrous sulfate 325 (65 FE) MG tablet Take 325 mg by mouth daily.   07/03/2015 at Unknown time  . Flax Oil-Fish Oil-Borage Oil (FISH OIL-FLAX OIL-BORAGE OIL) CAPS Take 2 capsules by mouth daily.   07/03/2015 at  Unknown time  . gabapentin (NEURONTIN) 100 MG capsule Take 100 mg by mouth daily as needed (pain).    unknown  . Insulin Degludec (TRESIBA FLEXTOUCH) 100 UNIT/ML SOPN Inject 70 Units into the skin at bedtime.   07/03/2015 at Unknown time  . insulin lispro (HUMALOG KWIKPEN) 100 UNIT/ML KiwkPen Inject 0.15-0.21 mLs (15-21 Units total) into the skin 3 (three) times daily. (Patient taking differently: Inject 15-21 Units into the skin 3 (three) times daily with meals. Per sliding scale) 15 mL 2 07/03/2015 at pm  . latanoprost (XALATAN) 0.005 % ophthalmic solution Place 1 drop into both eyes at bedtime.    07/03/2015 at Unknown time  . meclizine (ANTIVERT) 12.5 MG tablet Take 12.5 mg by mouth 2 (two) times daily.    07/03/2015 at Unknown time  . metoprolol tartrate (LOPRESSOR) 25 MG tablet TAKE ONE TABLET BY MOUTH TWICE DAILY 60 tablet 6 07/03/2015 at 2200  . Multiple Vitamin (MULTIVITAMIN WITH MINERALS) TABS tablet Take 1 tablet by mouth daily.   07/03/2015 at Unknown time  . Olmesartan-Amlodipine-HCTZ (TRIBENZOR) 40-10-25 MG TABS Take 1 tablet by mouth daily. 30 tablet  07/03/2015 at Unknown time  . rosuvastatin (CRESTOR) 20 MG tablet Take 20 mg by mouth at bedtime.    07/03/2015 at Unknown time  . torsemide (DEMADEX) 20  MG tablet Take 20 mg by mouth 2 (two) times daily.    07/03/2015 at Unknown time  . trimethoprim (TRIMPEX) 100 MG tablet Take 100 mg by mouth daily.    07/03/2015 at Unknown time  . glucose blood (ACCU-CHEK AVIVA) test strip Use to check blood glucose 4 times a day. E11.65 150 each 5   . [DISCONTINUED] insulin aspart (NOVOLOG FLEXPEN) 100 UNIT/ML FlexPen Inject 16 Units into the skin 3 (three) times daily with meals. 15 mL 2    Scheduled: . amLODipine  5 mg Oral Daily  . aspirin EC  81 mg Oral Daily  . calcitRIOL  0.25 mcg Oral Once per day on Mon Wed Fri  . famotidine  20 mg Oral Daily  . ferrous sulfate  325 mg Oral Daily  . heparin  5,000 Units Subcutaneous Q8H  . hydrALAZINE  37.5 mg  Oral Q8H  . insulin aspart  0-20 Units Subcutaneous TID WC  . insulin aspart  0-5 Units Subcutaneous QHS  . insulin aspart  6 Units Subcutaneous TID WC  . insulin glargine  70 Units Subcutaneous QHS  . isosorbide mononitrate  60 mg Oral Daily  . latanoprost  1 drop Both Eyes QHS  . metoprolol tartrate  50 mg Oral BID  . rosuvastatin  40 mg Oral QHS  . sodium chloride flush  3 mL Intravenous Q12H  . ticagrelor  90 mg Oral BID     ROS:                                                                                                                                       History obtained from the patient  General ROS: negative for - chills, fatigue, fever, night sweats, weight gain or weight loss Psychological ROS: negative for - behavioral disorder, hallucinations, memory difficulties, mood swings or suicidal ideation Ophthalmic ROS: negative for - blurry vision, double vision, eye pain or loss of vision ENT ROS: negative for - epistaxis, nasal discharge, oral lesions, sore throat, tinnitus or vertigo Allergy and Immunology ROS: negative for - hives or itchy/watery eyes Hematological and Lymphatic ROS: negative for - bleeding problems, bruising or swollen lymph nodes Endocrine ROS: negative for - galactorrhea, hair pattern changes, polydipsia/polyuria or temperature intolerance Respiratory ROS: negative for - cough, hemoptysis, shortness of breath or wheezing Cardiovascular ROS: negative for - chest pain, dyspnea on exertion, edema or irregular heartbeat Gastrointestinal ROS: negative for - abdominal pain, diarrhea, hematemesis, nausea/vomiting or stool incontinence Genito-Urinary ROS: negative for - dysuria, hematuria, incontinence or urinary frequency/urgency Musculoskeletal ROS: negative for - joint swelling or muscular weakness Neurological ROS: as noted in HPI Dermatological ROS: negative for rash and skin lesion changes   Blood pressure 137/51, pulse 69, temperature 98.5 F (36.9  C), temperature source Oral, resp. rate 14, height 5\' 7"  (1.702 m), weight 104.2 kg (229 lb 11.5 oz), SpO2 100 %.   Neurologic Examination:  HEENT-  Normocephalic, no lesions, without obvious abnormality.  Normal external eye and conjunctiva.  Normal TM's bilaterally.  Normal auditory canals and external ears. Normal external nose, mucus membranes and septum.  Normal pharynx. Cardiovascular- S1, S2 normal, pulses palpable throughout   Lungs- chest clear, no wheezing, rales, normal symmetric air entry Abdomen- normal findings: bowel sounds normal Extremities- no edema Lymph-no adenopathy palpable Musculoskeletal-no joint tenderness, deformity or swelling Skin-warm and dry, no hyperpigmentation, vitiligo, or suspicious lesions  Neurological Examination  Mental Status: Alert, oriented, thought content appropriate.  Speech fluent without evidence of aphasia.  Able to follow 3 step commands without difficulty. Cranial Nerves: II: visual fields grossly normal, pupils equal, round, reactive to light and accommodation III,IV, VI: ptosis not present, extraocular muscles extra-ocular motions intact bilaterally V,VII: smile symmetric, facial light touch sensation normal bilaterally VIII: hearing normal bilaterally IX,X: gag reflex present XI: trapezius strength/neck flexion strength normal bilaterally XII: tongue strength normal  Motor: Right : Upper extremity    Left:     Upper extremity 5/5 deltoid       5/5 deltoid 5/5 tricep      5/5 tricep 5/5 biceps      5/5 biceps  5/5wrist flexion     5/5 wrist flexion 0/5 wrist extension     5/5 wrist extension 5/5 hand grip      5/5 hand grip 0/5 finger extension  Lower extremity     Lower extremity 5/5 hip flexor      5/5 hip flexor 5/5 hip adductors     5/5 hip adductors 5/5 hip abductors     5/5 hip abductors 5/5 quadricep      5/5 quadriceps   5/5 hamstrings     5/5 hamstrings 5/5 plantar flexion       5/5 plantar flexion 5/5 plantar extension     5/5 plantar extension Tone and bulk:normal tone throughout; no atrophy noted Sensory: Pinprick and light touch intact throughout, bilaterally Deep Tendon Reflexes:  Right: Upper Extremity   Left: Upper extremity   biceps (C-5 to C-6) 2/4   biceps (C-5 to C-6) 2/4 tricep (C7) 2/4    triceps (C7) 2/4 Brachioradialis (C6) 2/4  Brachioradialis (C6) 2/4  Lower Extremity Lower Extremity  quadriceps (L-2 to L-4) 2/4   quadriceps (L-2 to L-4) 2/4 Achilles (S1) 2/4   Achilles (S1) 2/4 Plantars: Right: downgoing   Left: downgoing    Lab Results: Basic Metabolic Panel:  Recent Labs Lab 07/05/15 0220 07/06/15 0256 07/07/15 0259 07/08/15 0646 07/09/15 0242 07/09/15 1145  NA 138 138 131*  131* 133*  133* 135  134*  --   K 3.7 3.8 4.5  4.4 4.0  4.0 3.9  4.0  --   CL 97* 98* 93*  93* 93*  94* 96*  95*  --   CO2 28 27 25  26 28  28 28  27   --   GLUCOSE 303* 143* 253*  261* 254*  259* 257*  254*  --   BUN 78* 87* 96*  97* 91*  92* 86*  87*  --   CREATININE 2.55* 2.73* 3.29*  3.20* 2.57*  2.62* 2.39*  2.43* 2.26*  CALCIUM 9.3 9.1 8.8*  8.8* 8.8*  8.8* 8.8*  8.6*  --   MG  --  2.5*  --  2.9*  --   --   PHOS  --  3.5 5.4* 3.8 3.8  --     Liver Function Tests:  Recent Labs Lab  07/04/15 1456 07/06/15 0256 07/07/15 0259 07/08/15 0646 07/09/15 0242  AST 107*  --   --  46*  --   ALT 42  --   --  29  --   ALKPHOS 71  --   --  56  --   BILITOT 0.6  --   --  0.7  --   PROT 7.9  --   --  6.8  --   ALBUMIN 3.9 3.3* 3.2* 3.1*  3.1* 2.9*   No results for input(s): LIPASE, AMYLASE in the last 168 hours. No results for input(s): AMMONIA in the last 168 hours.  CBC:  Recent Labs Lab 07/04/15 1456  07/06/15 0822 07/07/15 0259 07/08/15 0646 07/09/15 0242 07/09/15 1145  WBC 7.8  < > 7.4 6.8 5.6 6.0 6.6  NEUTROABS 6.3  --  5.3  --  4.1  --   --   HGB  11.0*  < > 9.8* 8.6* 8.7* 6.6* 8.7*  HCT 35.6*  < > 31.3* 27.2* 27.5* 20.9* 28.4*  MCV 102.0*  < > 101.6* 98.9 100.7* 102.5* 99.6  PLT 171  < > 169 150 153 160 160  < > = values in this interval not displayed.  Cardiac Enzymes:  Recent Labs Lab 07/04/15 1456 07/04/15 2155 07/05/15 0220  TROPONINI 17.33* 17.98* 24.26*    Lipid Panel:  Recent Labs Lab 07/07/15 0259  CHOL 160  TRIG 119  HDL 40*  CHOLHDL 4.0  VLDL 24  LDLCALC 96    CBG:  Recent Labs Lab 07/08/15 1847 07/08/15 2106 07/09/15 0638 07/09/15 1005 07/09/15 1136  GLUCAP 205* 291* 191* 110* 98    Microbiology: Results for orders placed or performed during the hospital encounter of 07/04/15  MRSA PCR Screening     Status: None   Collection Time: 07/04/15  9:50 AM  Result Value Ref Range Status   MRSA by PCR NEGATIVE NEGATIVE Final    Comment:        The GeneXpert MRSA Assay (FDA approved for NASAL specimens only), is one component of a comprehensive MRSA colonization surveillance program. It is not intended to diagnose MRSA infection nor to guide or monitor treatment for MRSA infections.     Coagulation Studies:  Recent Labs  07/08/15 0646  LABPROT 14.5  INR 1.11    Imaging: No results found.     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 Symptoms likely secondary to arterial occlusion and not neurological etiology   - This all is likely vascular in nature  - Would recommend to look at radial artery more closely, possible arterial US. As per exam, her brachioradials is intact which would suggest that the clot is further downstream from the brachial artery. May have micro clots in the radial and ulnar branches - No further neurological testing is necessary

## 2015-07-09 NOTE — Progress Notes (Signed)
    Subjective  -   Patient back from angiogram.  Having some pain in the left groin.  Right arm is still numb   Physical Exam:  No pulse and right brachial or radial artery Abdomen soft Respirations nonlabored       Assessment/Plan:  Left arm numbness  Appreciate neurology consult.  Etiology of right hand and arm numbness is likely secondary to right subclavian artery occlusion.  I discussed with the patient and with her family at the bedside, proceeding with revascularization of the right arm.  We discussed making an axillary incision and attempting a thrombectomy.  If this is unsuccessful, she would need a subclavian artery bypass.  I discussed the details of the procedure as well as the potential complications which include but are not limited to bleeding, given that she cannot come off of her antiplatelet therapy given her recent end STEMI, cardiac issues, incisional complications.  She understands these and wished to proceed.  She also understands that if arterial occlusion is not the underlying etiology of her symptoms, that this would not resolve all of her complaints, however I feel like this most likely will alleviate her arm numbness.  Assuming that her hemoglobin remained stable, and assuming that her creatinine is not significantly increased tomorrow, after today's low for arteriogram, I will proceed with surgery tomorrow.  Annamarie Major 07/09/2015 2:33 PM --  Filed Vitals:   07/09/15 1247 07/09/15 1305  BP: 147/69 137/51  Pulse: 76 69  Temp:  98.5 F (36.9 C)  Resp:      Intake/Output Summary (Last 24 hours) at 07/09/15 1433 Last data filed at 07/09/15 1238  Gross per 24 hour  Intake  127.5 ml  Output    150 ml  Net  -22.5 ml     Laboratory CBC    Component Value Date/Time   WBC 6.6 07/09/2015 1145   HGB 8.7* 07/09/2015 1145   HCT 28.4* 07/09/2015 1145   PLT 160 07/09/2015 1145    BMET    Component Value Date/Time   NA 134* 07/09/2015 0242   NA  135 07/09/2015 0242   NA 144 06/23/2015 1004   K 4.0 07/09/2015 0242   K 3.9 07/09/2015 0242   CL 95* 07/09/2015 0242   CL 96* 07/09/2015 0242   CO2 27 07/09/2015 0242   CO2 28 07/09/2015 0242   GLUCOSE 254* 07/09/2015 0242   GLUCOSE 257* 07/09/2015 0242   GLUCOSE 39* 06/23/2015 1004   BUN 87* 07/09/2015 0242   BUN 86* 07/09/2015 0242   BUN 67* 06/23/2015 1004   CREATININE 2.26* 07/09/2015 1145   CREATININE 1.73* 10/08/2013 1024   CALCIUM 8.6* 07/09/2015 0242   CALCIUM 8.8* 07/09/2015 0242   GFRNONAA 21* 07/09/2015 1145   GFRAA 24* 07/09/2015 1145    COAG Lab Results  Component Value Date   INR 1.11 07/08/2015   INR 1.16 07/04/2015   INR 0.9 RATIO 03/31/2006   No results found for: PTT  Antibiotics Anti-infectives    None       V. Leia Alf, M.D. [facility] Office: [phone] Pager:  [phone]

## 2015-07-09 NOTE — Progress Notes (Signed)
Subjective: Interval History: none.. Sitting up in chair. Planing of right knee pain. She is frustrated at not being able to make progress. No change in the diminished grip strength in her right hand. No specific pain in her right hand. Pain in the knee is no different from my exam yesterday. This is specifically in the knee joint. Reports that she is able to stand and put weight on her right leg but has pain in the patella and knee joint.  Objective: Vital signs in last 24 hours: Temp:  [98.1 F (36.7 C)-98.6 F (37 C)] 98.6 F (37 C) (05/04 0400) Pulse Rate:  [58-71] 58 (05/04 0400) Resp:  [16-18] 18 (05/04 0400) BP: (118-130)/(51-66) 118/51 mmHg (05/04 0400) SpO2:  [98 %-100 %] 100 % (05/04 0400) Weight:  [229 lb 11.5 oz (104.2 kg)] 229 lb 11.5 oz (104.2 kg) (05/04 0400)  Intake/Output from previous day: 05/03 0701 - 05/04 0700 In: 231.1 [I.V.:231.1] Out: 450 [Urine:450] Intake/Output this shift:    Both hands warm. No appreciable difference between her right and left hand from a temperature standpoint. Does have diminished grip strength in her right hand compared to her left.  Lab Results:  Recent Labs  07/08/15 0646 07/09/15 0242  WBC 5.6 6.0  HGB 8.7* 6.6*  HCT 27.5* 20.9*  PLT 153 160   BMET  Recent Labs  07/08/15 0646 07/09/15 0242  NA 133*  133* 135  134*  K 4.0  4.0 3.9  4.0  CL 93*  94* 96*  95*  CO2 28  28 28  27   GLUCOSE 254*  259* 257*  254*  BUN 91*  92* 86*  87*  CREATININE 2.57*  2.62* 2.39*  2.43*  CALCIUM 8.8*  8.8* 8.8*  8.6*    Studies/Results: Dg Chest Port 1 View  07/05/2015  CLINICAL DATA:  70 year old female with history of shortness of breath. History of congestive heart failure. EXAM: PORTABLE CHEST 1 VIEW COMPARISON:  Chest x-ray 07/04/2015. FINDINGS: Aeration has improved significantly compared to the prior study, although there continues to be patchy asymmetrically distributed airspace disease predominantly in the left  lung base and throughout the right mid to lower lung. Small bilateral pleural effusions. Minimal cephalization of the pulmonary vasculature. Heart size is normal. Upper mediastinal contours are within normal limits. IMPRESSION: 1. Resolving multifocal airspace disease. This was relatively symmetric in appearance on the prior study, which may indicate resolving pulmonary edema, however, the appearance is far more asymmetric on today's study, and heart size is normal. Clinical correlation for multilobar bronchopneumonia is suggested. 2. Small bilateral pleural effusions. Electronically Signed   By: Vinnie Langton M.D.   On: 07/05/2015 10:44   Anti-infectives: Anti-infectives    None      Assessment/Plan: s/p Procedure(s): Left Heart Cath and Coronary Angiography (N/A) Coronary Stent Intervention (N/A) Mary complicated problem. Now with severe anemia as well with a hemoglobin of 6.6 and hematocrit of 20 I discussed this at length with the patient and 2 children present. 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 Uncommon that this would be causing motor deficit in her right hand. She has had cervical disc surgery in the past. Will ask neurology to see today to determine if this could be related to neurologic issue rather than ischemic issue. Plan for innominate and right subclavian arteriogram today with limited dye. Again explained potential for worsening renal function related to this. Fortunately her creatinine has dropped to 2.4 from a high of  3.2. Explained the potential for carotid subclavian bypass tomorrow if no other cause for her hand weakness can be identified. Explained that I am out of town tomorrow and that one of my partners would be doing the surgery if surgery is required.   LOS: 5 days   Edwina Grossberg 07/09/2015, 8:03 AM

## 2015-07-09 NOTE — Progress Notes (Addendum)
Site area: Right groin a 5 french arterial sheath was removed  Site Prior to Removal:  Level 0  Pressure Applied For 15 MINUTES    Bedrest Beginning at 1025a  Manual:   Yes.    Patient Status During Pull:  stable  Post Pull Groin Site:  Level 0  Post Pull Instructions Given:  Yes.    Post Pull Pulses Present:  Yes.    Dressing Applied:  Yes.    Comments:  VS remain stable during sheath pull.  Report given to  [place] [facility] [place]

## 2015-07-10 ENCOUNTER — Encounter (HOSPITAL_COMMUNITY)
Admission: AD | Disposition: A | Discharge status: Home / Self Care | Payer: Self-pay | Source: Other Acute Inpatient Hospital | Attending: Internal Medicine

## 2015-07-10 ENCOUNTER — Encounter (HOSPITAL_COMMUNITY): Payer: Self-pay | Admitting: Anesthesiology

## 2015-07-10 ENCOUNTER — Inpatient Hospital Stay (HOSPITAL_COMMUNITY): Payer: BLUE CROSS/BLUE SHIELD | Admitting: Anesthesiology

## 2015-07-10 ENCOUNTER — Inpatient Hospital Stay (HOSPITAL_COMMUNITY): Payer: BLUE CROSS/BLUE SHIELD

## 2015-07-10 DIAGNOSIS — I75011 Atheroembolism of right upper extremity: Secondary | ICD-10-CM

## 2015-07-10 HISTORY — PX: PERIPHERAL VASCULAR CATHETERIZATION: SHX172C

## 2015-07-10 LAB — TYPE AND SCREEN
ABO/RH(D): B POS
ANTIBODY SCREEN: NEGATIVE
UNIT DIVISION: 0
UNIT DIVISION: 0

## 2015-07-10 LAB — CBC
HEMATOCRIT: 31.9 % — AB (ref 36.0–46.0)
Hemoglobin: 9.9 g/dL — ABNORMAL LOW (ref 12.0–15.0)
MCH: 30.3 pg (ref 26.0–34.0)
MCHC: 31 g/dL (ref 30.0–36.0)
MCV: 97.6 fL (ref 78.0–100.0)
PLATELETS: 171 10*3/uL (ref 150–400)
RBC: 3.27 MIL/uL — AB (ref 3.87–5.11)
RDW: 16.7 % — AB (ref 11.5–15.5)
WBC: 5.3 10*3/uL (ref 4.0–10.5)

## 2015-07-10 LAB — BASIC METABOLIC PANEL
Anion gap: 12 (ref 5–15)
BUN: 66 mg/dL — AB (ref 6–20)
CHLORIDE: 102 mmol/L (ref 101–111)
CO2: 23 mmol/L (ref 22–32)
CREATININE: 1.87 mg/dL — AB (ref 0.44–1.00)
Calcium: 9 mg/dL (ref 8.9–10.3)
GFR, EST AFRICAN AMERICAN: 30 mL/min — AB (ref >60)
GFR, EST NON AFRICAN AMERICAN: 26 mL/min — AB (ref >60)
Glucose, Bld: 156 mg/dL — ABNORMAL HIGH (ref 65–99)
POTASSIUM: 4.6 mmol/L (ref 3.5–5.1)
SODIUM: 137 mmol/L (ref 135–145)

## 2015-07-10 LAB — RENAL FUNCTION PANEL
ALBUMIN: 3 g/dL — AB (ref 3.5–5.0)
ANION GAP: 15 (ref 5–15)
BUN: 65 mg/dL — AB (ref 6–20)
CO2: 23 mmol/L (ref 22–32)
Calcium: 9.1 mg/dL (ref 8.9–10.3)
Chloride: 101 mmol/L (ref 101–111)
Creatinine, Ser: 1.88 mg/dL — ABNORMAL HIGH (ref 0.44–1.00)
GFR, EST AFRICAN AMERICAN: 30 mL/min — AB (ref >60)
GFR, EST NON AFRICAN AMERICAN: 26 mL/min — AB (ref >60)
Glucose, Bld: 155 mg/dL — ABNORMAL HIGH (ref 65–99)
PHOSPHORUS: 3.2 mg/dL (ref 2.5–4.6)
POTASSIUM: 4.5 mmol/L (ref 3.5–5.1)
Sodium: 139 mmol/L (ref 135–145)

## 2015-07-10 LAB — GLUCOSE, CAPILLARY
GLUCOSE-CAPILLARY: 129 mg/dL — AB (ref 65–99)
GLUCOSE-CAPILLARY: 62 mg/dL — AB (ref 65–99)
GLUCOSE-CAPILLARY: 114 mg/dL — AB (ref 65–99)
Glucose-Capillary: 115 mg/dL — ABNORMAL HIGH (ref 65–99)
Glucose-Capillary: 138 mg/dL — ABNORMAL HIGH (ref 65–99)
Glucose-Capillary: 159 mg/dL — ABNORMAL HIGH (ref 65–99)

## 2015-07-10 LAB — PROTIME-INR
INR: 1.1 (ref 0.00–1.49)
Prothrombin Time: 14.4 seconds (ref 11.6–15.2)

## 2015-07-10 SURGERY — THROMBECTOMY
Anesthesia: General | Site: Arm Upper | Laterality: Right

## 2015-07-10 MED ORDER — PROTAMINE SULFATE 10 MG/ML IV SOLN
INTRAVENOUS | Status: DC | PRN
  Administered 2015-07-10 (×4): 10 mg via INTRAVENOUS

## 2015-07-10 MED ORDER — FENTANYL CITRATE (PF) 100 MCG/2ML IJ SOLN
INTRAMUSCULAR | Status: DC | PRN
  Administered 2015-07-10: 150 ug via INTRAVENOUS
  Administered 2015-07-10 (×2): 50 ug via INTRAVENOUS

## 2015-07-10 MED ORDER — ARTIFICIAL TEARS OP OINT
TOPICAL_OINTMENT | OPHTHALMIC | Status: DC | PRN
  Administered 2015-07-10: 1 via OPHTHALMIC

## 2015-07-10 MED ORDER — 0.9 % SODIUM CHLORIDE (POUR BTL) OPTIME
TOPICAL | Status: DC | PRN
  Administered 2015-07-10: 2000 mL

## 2015-07-10 MED ORDER — DEXTROSE 50 % IV SOLN
INTRAVENOUS | Status: AC
  Filled 2015-07-10: qty 50

## 2015-07-10 MED ORDER — SODIUM CHLORIDE 0.9 % IV SOLN
INTRAVENOUS | Status: DC | PRN
  Administered 2015-07-10: 500 mL

## 2015-07-10 MED ORDER — NEOSTIGMINE METHYLSULFATE 10 MG/10ML IV SOLN
INTRAVENOUS | Status: DC | PRN
  Administered 2015-07-10: 3 mg via INTRAVENOUS

## 2015-07-10 MED ORDER — FENTANYL CITRATE (PF) 250 MCG/5ML IJ SOLN
INTRAMUSCULAR | Status: AC
  Filled 2015-07-10: qty 5

## 2015-07-10 MED ORDER — EPHEDRINE 5 MG/ML INJ
INTRAVENOUS | Status: AC
  Filled 2015-07-10: qty 10

## 2015-07-10 MED ORDER — DEXTROSE 5 % IV SOLN
1.5000 g | INTRAVENOUS | Status: DC
  Filled 2015-07-10: qty 1.5

## 2015-07-10 MED ORDER — MIDAZOLAM HCL 5 MG/5ML IJ SOLN
INTRAMUSCULAR | Status: DC | PRN
  Administered 2015-07-10 (×2): 1 mg via INTRAVENOUS

## 2015-07-10 MED ORDER — MEPERIDINE HCL 25 MG/ML IJ SOLN: 6.2500 mg | INTRAMUSCULAR | Status: DC | PRN

## 2015-07-10 MED ORDER — ARTIFICIAL TEARS OP OINT
TOPICAL_OINTMENT | OPHTHALMIC | Status: AC
Start: 2015-07-10 — End: 2015-07-10
  Filled 2015-07-10: qty 3.5

## 2015-07-10 MED ORDER — EPHEDRINE SULFATE 50 MG/ML IJ SOLN
INTRAMUSCULAR | Status: DC | PRN
  Administered 2015-07-10: 10 mg via INTRAVENOUS
  Administered 2015-07-10 (×2): 5 mg via INTRAVENOUS

## 2015-07-10 MED ORDER — ONDANSETRON HCL 4 MG/2ML IJ SOLN
INTRAMUSCULAR | Status: DC | PRN
  Administered 2015-07-10: 4 mg via INTRAVENOUS

## 2015-07-10 MED ORDER — HYDROMORPHONE HCL 1 MG/ML IJ SOLN: 0.2500 mg | INTRAMUSCULAR | Status: DC | PRN

## 2015-07-10 MED ORDER — ONDANSETRON HCL 4 MG/2ML IJ SOLN
INTRAMUSCULAR | Status: AC
  Filled 2015-07-10: qty 2

## 2015-07-10 MED ORDER — ROCURONIUM BROMIDE 50 MG/5ML IV SOLN
INTRAVENOUS | Status: AC
Start: 2015-07-10 — End: 2015-07-10
  Filled 2015-07-10: qty 1

## 2015-07-10 MED ORDER — ROCURONIUM BROMIDE 100 MG/10ML IV SOLN
INTRAVENOUS | Status: DC | PRN
  Administered 2015-07-10: 50 mg via INTRAVENOUS

## 2015-07-10 MED ORDER — HEMOSTATIC AGENTS (NO CHARGE) OPTIME
TOPICAL | Status: DC | PRN
  Administered 2015-07-10: 1 via TOPICAL

## 2015-07-10 MED ORDER — PROPOFOL 10 MG/ML IV BOLUS
INTRAVENOUS | Status: DC | PRN
  Administered 2015-07-10: 100 mg via INTRAVENOUS

## 2015-07-10 MED ORDER — LIDOCAINE HCL (CARDIAC) 20 MG/ML IV SOLN
INTRAVENOUS | Status: DC | PRN
  Administered 2015-07-10: 100 mg via INTRAVENOUS

## 2015-07-10 MED ORDER — MIDAZOLAM HCL 2 MG/2ML IJ SOLN
INTRAMUSCULAR | Status: AC
  Filled 2015-07-10: qty 2

## 2015-07-10 MED ORDER — DEXTROSE 50 % IV SOLN
12.5000 g | Freq: Once | INTRAVENOUS | Status: AC
  Administered 2015-07-10 – 2015-07-11 (×2): 12.5 g via INTRAVENOUS

## 2015-07-10 MED ORDER — HEPARIN SODIUM (PORCINE) 1000 UNIT/ML IJ SOLN
INTRAMUSCULAR | Status: DC | PRN
  Administered 2015-07-10: 9000 [IU] via INTRAVENOUS

## 2015-07-10 MED ORDER — GLYCOPYRROLATE 0.2 MG/ML IJ SOLN
INTRAMUSCULAR | Status: DC | PRN
  Administered 2015-07-10: 0.4 mg via INTRAVENOUS

## 2015-07-10 MED ORDER — SODIUM CHLORIDE 0.9 % IV SOLN
INTRAVENOUS | Status: DC | PRN
  Administered 2015-07-10 (×2): via INTRAVENOUS

## 2015-07-10 MED ORDER — PROPOFOL 10 MG/ML IV BOLUS
INTRAVENOUS | Status: AC
  Filled 2015-07-10: qty 40

## 2015-07-10 MED ORDER — ONDANSETRON HCL 4 MG/2ML IJ SOLN
4.0000 mg | Freq: Once | INTRAMUSCULAR | Status: AC | PRN
  Administered 2015-07-10: 4 mg via INTRAVENOUS

## 2015-07-10 SURGICAL SUPPLY — 46 items
BAG DECANTER FOR FLEXI CONT (MISCELLANEOUS) IMPLANT
CANISTER SUCTION 2500CC (MISCELLANEOUS) ×4 IMPLANT
CATH EMB 4FR 40CM (CATHETERS) ×4 IMPLANT
CATH ROBINSON RED A/P 18FR (CATHETERS) IMPLANT
CATH SUCT 10FR WHISTLE TIP (CATHETERS) IMPLANT
CLIP TI MEDIUM 24 (CLIP) ×4 IMPLANT
CLIP TI WIDE RED SMALL 24 (CLIP) ×4 IMPLANT
CONT SPEC 4OZ CLIKSEAL STRL BL (MISCELLANEOUS) ×4 IMPLANT
CRADLE DONUT ADULT HEAD (MISCELLANEOUS) ×4 IMPLANT
DRAIN CHANNEL 15F RND FF W/TCR (WOUND CARE) IMPLANT
ELECT REM PT RETURN 9FT ADLT (ELECTROSURGICAL) ×4
ELECTRODE REM PT RTRN 9FT ADLT (ELECTROSURGICAL) ×2 IMPLANT
EVACUATOR SILICONE 100CC (DRAIN) IMPLANT
GAUZE SPONGE 4X4 12PLY STRL (GAUZE/BANDAGES/DRESSINGS) ×4 IMPLANT
GLOVE BIO SURGEON STRL SZ 6.5 (GLOVE) ×3 IMPLANT
GLOVE BIO SURGEONS STRL SZ 6.5 (GLOVE) ×1
GLOVE BIOGEL PI IND STRL 6.5 (GLOVE) ×8 IMPLANT
GLOVE BIOGEL PI IND STRL 7.5 (GLOVE) ×2 IMPLANT
GLOVE BIOGEL PI INDICATOR 6.5 (GLOVE) ×8
GLOVE BIOGEL PI INDICATOR 7.5 (GLOVE) ×2
GLOVE ECLIPSE 6.5 STRL STRAW (GLOVE) ×12 IMPLANT
GLOVE SURG SS PI 7.5 STRL IVOR (GLOVE) ×8 IMPLANT
GOWN STRL REUS W/ TWL LRG LVL3 (GOWN DISPOSABLE) ×6 IMPLANT
GOWN STRL REUS W/ TWL XL LVL3 (GOWN DISPOSABLE) ×2 IMPLANT
GOWN STRL REUS W/TWL LRG LVL3 (GOWN DISPOSABLE) ×6
GOWN STRL REUS W/TWL XL LVL3 (GOWN DISPOSABLE) ×2
HEMOSTAT SNOW SURGICEL 2X4 (HEMOSTASIS) ×4 IMPLANT
KIT BASIN OR (CUSTOM PROCEDURE TRAY) ×4 IMPLANT
KIT ROOM TURNOVER OR (KITS) ×4 IMPLANT
LIQUID BAND (GAUZE/BANDAGES/DRESSINGS) ×4 IMPLANT
NS IRRIG 1000ML POUR BTL (IV SOLUTION) ×8 IMPLANT
PACK CAROTID (CUSTOM PROCEDURE TRAY) ×4 IMPLANT
PAD ARMBOARD 7.5X6 YLW CONV (MISCELLANEOUS) ×8 IMPLANT
SHUNT CAROTID BYPASS 10 (VASCULAR PRODUCTS) IMPLANT
SHUNT CAROTID BYPASS 12 (VASCULAR PRODUCTS) IMPLANT
SPONGE INTESTINAL PEANUT (DISPOSABLE) ×4 IMPLANT
STOCKINETTE 4X48 STRL (DRAPES) ×4 IMPLANT
SUT ETHILON 3 0 PS 1 (SUTURE) IMPLANT
SUT PROLENE 6 0 BV (SUTURE) ×8 IMPLANT
SUT PROLENE 7 0 BV 1 (SUTURE) IMPLANT
SUT VIC AB 3-0 SH 27 (SUTURE) ×4
SUT VIC AB 3-0 SH 27X BRD (SUTURE) ×4 IMPLANT
SUT VICRYL 4-0 PS2 18IN ABS (SUTURE) ×4 IMPLANT
SYRINGE 3CC LL L/F (MISCELLANEOUS) ×4 IMPLANT
TRAY FOLEY W/METER SILVER 16FR (SET/KITS/TRAYS/PACK) IMPLANT
WATER STERILE IRR 1000ML POUR (IV SOLUTION) ×4 IMPLANT

## 2015-07-10 NOTE — H&P (View-Only) (Signed)
    Subjective  -   Patient back from angiogram.  Having some pain in the left groin.  Right arm is still numb   Physical Exam:  No pulse and right brachial or radial artery Abdomen soft Respirations nonlabored       Assessment/Plan:  Left arm numbness  Appreciate neurology consult.  Etiology of right hand and arm numbness is likely secondary to right subclavian artery occlusion.  I discussed with the patient and with her family at the bedside, proceeding with revascularization of the right arm.  We discussed making an axillary incision and attempting a thrombectomy.  If this is unsuccessful, she would need a subclavian artery bypass.  I discussed the details of the procedure as well as the potential complications which include but are not limited to bleeding, given that she cannot come off of her antiplatelet therapy given her recent end STEMI, cardiac issues, incisional complications.  She understands these and wished to proceed.  She also understands that if arterial occlusion is not the underlying etiology of her symptoms, that this would not resolve all of her complaints, however I feel like this most likely will alleviate her arm numbness.  Assuming that her hemoglobin remained stable, and assuming that her creatinine is not significantly increased tomorrow, after today's low for arteriogram, I will proceed with surgery tomorrow.  Annamarie Major 07/09/2015 2:33 PM --  Filed Vitals:   07/09/15 1247 07/09/15 1305  BP: 147/69 137/51  Pulse: 76 69  Temp:  98.5 F (36.9 C)  Resp:      Intake/Output Summary (Last 24 hours) at 07/09/15 1433 Last data filed at 07/09/15 1238  Gross per 24 hour  Intake  127.5 ml  Output    150 ml  Net  -22.5 ml     Laboratory CBC    Component Value Date/Time   WBC 6.6 07/09/2015 1145   HGB 8.7* 07/09/2015 1145   HCT 28.4* 07/09/2015 1145   PLT 160 07/09/2015 1145    BMET    Component Value Date/Time   NA 134* 07/09/2015 0242   NA  135 07/09/2015 0242   NA 144 06/23/2015 1004   K 4.0 07/09/2015 0242   K 3.9 07/09/2015 0242   CL 95* 07/09/2015 0242   CL 96* 07/09/2015 0242   CO2 27 07/09/2015 0242   CO2 28 07/09/2015 0242   GLUCOSE 254* 07/09/2015 0242   GLUCOSE 257* 07/09/2015 0242   GLUCOSE 39* 06/23/2015 1004   BUN 87* 07/09/2015 0242   BUN 86* 07/09/2015 0242   BUN 67* 06/23/2015 1004   CREATININE 2.26* 07/09/2015 1145   CREATININE 1.73* 10/08/2013 1024   CALCIUM 8.6* 07/09/2015 0242   CALCIUM 8.8* 07/09/2015 0242   GFRNONAA 21* 07/09/2015 1145   GFRAA 24* 07/09/2015 1145    COAG Lab Results  Component Value Date   INR 1.11 07/08/2015   INR 1.16 07/04/2015   INR 0.9 RATIO 03/31/2006   No results found for: PTT  Antibiotics Anti-infectives    None       V. Leia Alf, M.D. [facility] [employer] [facility] Office: [phone] Pager:  [phone]

## 2015-07-10 NOTE — Anesthesia Preprocedure Evaluation (Signed)
Anesthesia Evaluation  Patient identified by MRN, date of birth, ID band Patient awake    Reviewed: Allergy & Precautions, NPO status , Patient's Chart, lab work & pertinent test results  Airway Mallampati: I  TM Distance: >3 FB Neck ROM: Full    Dental   Pulmonary    Pulmonary exam normal        Cardiovascular hypertension, Pt. on medications + CAD and + Past MI  Normal cardiovascular exam     Neuro/Psych    GI/Hepatic GERD  Medicated and Controlled,  Endo/Other  diabetes, Type 2, Oral Hypoglycemic Agents  Renal/GU Renal InsufficiencyRenal disease     Musculoskeletal   Abdominal   Peds  Hematology   Anesthesia Other Findings   Reproductive/Obstetrics                             Anesthesia Physical Anesthesia Plan  ASA: III  Anesthesia Plan: General   Post-op Pain Management:    Induction: Intravenous  Airway Management Planned: Oral ETT  Additional Equipment: Arterial line  Intra-op Plan:   Post-operative Plan: Extubation in OR  Informed Consent: I have reviewed the patients History and Physical, chart, labs and discussed the procedure including the risks, benefits and alternatives for the proposed anesthesia with the patient or authorized representative who has indicated his/her understanding and acceptance.     Plan Discussed with: CRNA and Surgeon  Anesthesia Plan Comments:         Anesthesia Quick Evaluation

## 2015-07-10 NOTE — Progress Notes (Signed)
  [facility]  S/p right subclavian thrombectomy, day of surgery  Patient with palpable right radial pulse. Says her hand pain is better but still has some numbness. Explained that her numbness is from prolonged ischemia. Discussed that this will improve with time.   Right central line placement site with some oozing. Patient is on brilinta. No visible hematoma. RN holding pressure.   Stable from vascular standpoint.   Virgina Jock, PA-C [facility] Office: [phone] Pager: [phone] 07/10/2015 1:32 PM

## 2015-07-10 NOTE — Anesthesia Procedure Notes (Addendum)
Procedure Name: Intubation Date/Time: 07/10/2015 8:36 AM Performed by: Scheryl Darter Pre-anesthesia Checklist: Patient identified, Emergency Drugs available, Suction available, Patient being monitored and Timeout performed Patient Re-evaluated:Patient Re-evaluated prior to inductionOxygen Delivery Method: Circle system utilized Preoxygenation: Pre-oxygenation with 100% oxygen Intubation Type: IV induction Ventilation: Mask ventilation without difficulty Laryngoscope Size: Miller and 2 Grade View: Grade I Tube type: Oral Tube size: 7.5 mm Number of attempts: 1 Airway Equipment and Method: Stylet and Oral airway Placement Confirmation: ETT inserted through vocal cords under direct vision,  positive ETCO2 and breath sounds checked- equal and bilateral Secured at: 22 cm Tube secured with: Tape Dental Injury: Teeth and Oropharynx as per pre-operative assessment       RIJ CVP Dual Lumen: [id number]:The patient was identified and consent obtained.  TO was performed, and full barrier precautions were used.  The skin was anesthetized with lidocaine.  Once the vein was located with the 22 ga. needle using ultrasound guidance , the wire was inserted into the vein.  The wire location was confirmed with ultrasound.  The tissue was dilated and the catheter was carefully inserted, then sutured in place. A dressing was applied. The patient tolerated the procedure well.  CE

## 2015-07-10 NOTE — Progress Notes (Signed)
PROGRESS NOTE                                                                                                                                                                                                             Patient Demographics:    [name], is a 70 y.o. female, DOB - [birth year], [id number]  Admit date - 07/04/2015   Admitting Physician Minus Breeding, MD  Outpatient Primary MD for the patient is Maggie Font, MD  LOS - 6  Outpatient Specialists: Cardiology Dr Bronson Ing Gastroenterology Dr. Oneida Alar Nephrology: Dr. Lorrene Reid   Chief complaint: Shortness of breath  Brief Narrative   70 y.o. female with a history of CAD S/P DES to circumflex lesion in 2008 (multivessel disease noted at that time involving the LAD, 1st diagonal; historicaly, she has had a preserved EF), IDDM, HTN, dyslipidemia, and GERD presented at [facility] ED with progressive shortness of breath and worsening cough. She was found to have NSTEMI and transferred to [facility]. She was also found to have flash pulmonary edema requiring BiPAP. Patient underwent cardiac catheterization with mid RCA and posterior PDA stenting. Hospital course prolonged due to worsened renal function and  finding of subclavian steal syndrome.    Subjective:      Assessment  & Plan :    Principal Problem:    NSTEMI (non-ST elevated myocardial infarction) ([place]) -Status post left heart catheterization, Mid RCA and posterior PDA stenting. -dual antiplatelet. ( aspirin and Brilinta) May require viability study in the LCx territory with a planned staged intervention for chronic total occlusion. Cardiology plan on medical management for now given her underlying renal function. Continue Crestor , Lopressor,  and Imdur.   Active Problems: Distal right subclavian artery occlusion Found incidentally prior to cath as patient has decreased radial  pulsation. Patient reports numbness in her right hand for several weeks and now has weakness with some wristdrop in her right hand. Appeared to be chronic total occlusion . Arteriogram done with her right arm angiogram which shows patent proximal right subclavian artery with distal right subclavian artery occlusion. Patient taken to walk on 5/54 right subclavian artery thromboembolectomy . Tolerated procedure well. Has improved right radial pulse and decreased numbness postoperatively.  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 Diuresed with IV Lasix but given worsened renal function it was discontinued. She is currently euvolemic. Continue aspirin, beta blocker, hydralazine, Imdur and statin. Not on ACEi/ ARB due to renal disease.   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 Follows with Dr. Lorrene Reid. Renal function worsened with diuretics and post cardiac cath. Lasix  discontinued. Creatinine now improving to normal. Renal following.    Acute on chronic anemia Drop in H&H of almost 2 g on 5/4. Transfused 1 unit PRBC with significant improvement. Stool for occult blood pending. Now off IV heparin drip. Recheck in a.m. Unclear if this is a lab error.  Multifocal airspace disease on chest x-ray Possibly due to pulmonary edema. No signs of pneumonia.  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 CBG elevated. Increased to home dose Lantus and added pre-meal coverage. Low CBG this morning which is due to patient being nothing by mouth.   Right knee joint pain Possibly in the setting of osteoarthritis. No swelling or deformity is limited flexion due to pain. Check x-ray of the right knee for any effusion.   Code Status : Full code  Family Communication  : none at bedside  Disposition Plan  : Home once symptoms better.  Possibly in next 48 -72 hrs  Barriers For Discharge :  Rt subclavian thrombectomy done today  Consults  :   Cardiology Vascular surgery Nephrology  Procedures  :  2-D echo Cardiac cath Right upper extremity Doppler Carotid Doppler Right arm angiogram Right subclavian lumpectomy  DVT Prophylaxis  : Subcutaneous heparin   Lab Results  Component Value Date   PLT 171 07/10/2015    Antibiotics  :  NONE  Anti-infectives    Start     Dose/Rate Route Frequency Ordered Stop   07/10/15 0845  cefUROXime (ZINACEF) 1.5 g in dextrose 5 % 50 mL IVPB  Status:  Discontinued     1.5 g 100 mL/hr over 30 Minutes Intravenous To Surgery 07/10/15 0844 07/10/15 1228   07/10/15 0600  cefUROXime (ZINACEF) 1.5 g in dextrose 5 % 50 mL IVPB     1.5 g 100 mL/hr over 30 Minutes Intravenous To ShortStay Surgical 07/09/15 2021 07/10/15 0849        Objective:   Filed Vitals:   07/10/15 1130 07/10/15 1200 07/10/15 1215 07/10/15 1242  BP: 151/59 139/68 139/62 143/52  Pulse: 78 76 77 79  Temp:   97.9 F (36.6 C) 98 F (36.7 C)  TempSrc:    Oral  Resp: 14 14 14 16   Height:      Weight:      SpO2: 96% 100% 100% 100%    Wt Readings from Last 3 Encounters:  07/10/15 104.8 kg (231 lb 0.7 oz)  06/30/15 102.059 kg (225 lb)  03/30/15 102.059 kg (225 lb)     Intake/Output Summary (Last 24 hours) at 07/10/15 1516 Last data filed at 07/10/15 1037  Gross per 24 hour  Intake   1035 ml  Output    500 ml  Net    535 ml     Physical Exam  Gen: not in distress HEENT: Mucosa, left IJ, supple neck Chest: clear b/l, no added sounds CVS: N S1&S2, no murmurs, rubs or gallop GI: soft, NT, ND, BS+ Musculoskeletal: warm, no edema, improved right radial pulsation with improved handgrip CNS: Sleepy from anesthesia but arousable    Data Review:    CBC  Recent Labs  Lab 07/04/15 1456  07/06/15 [id number] 07/07/15 0259 07/08/15 0646 07/09/15 0242 07/09/15 1145 07/10/15 0225  WBC 7.8  < > 7.4 6.8  5.6 6.0 6.6 5.3  HGB 11.0*  < > 9.8* 8.6* 8.7* 6.6* 8.7* 9.9*  HCT 35.6*  < > 31.3* 27.2* 27.5* 20.9* 28.4* 31.9*  PLT 171  < > 169 150 153 160 160 171  MCV 102.0*  < > 101.6* 98.9 100.7* 102.5* 99.6 97.6  MCH 31.5  < > 31.8 31.3 31.9 32.4 30.5 30.3  MCHC 30.9  < > 31.3 31.6 31.6 31.6 30.6 31.0  RDW 14.5  < > 14.5 14.4 14.3 14.4 14.4 16.7*  LYMPHSABS 0.9  --  1.2  --  0.9  --   --   --   MONOABS 0.6  --  0.7  --  0.4  --   --   --   EOSABS 0.0  --  0.1  --  0.2  --   --   --   BASOSABS 0.0  --  0.0  --  0.0  --   --   --   < > = values in this interval not displayed.  Chemistries   Recent Labs Lab 07/04/15 1456  07/06/15 0256 07/07/15 0259 07/08/15 0646 07/09/15 0242 07/09/15 1145 07/10/15 0225 07/10/15 0300  NA 136  < > 138 131*  131* 133*  133* 135  134*  --  137 139  K 4.3  < > 3.8 4.5  4.4 4.0  4.0 3.9  4.0  --  4.6 4.5  CL 94*  < > 98* 93*  93* 93*  94* 96*  95*  --  102 101  CO2 26  < > 27 25  26 28  28 28  27   --  23 23  GLUCOSE 496*  < > 143* 253*  261* 254*  259* 257*  254*  --  156* 155*  BUN 78*  < > 87* 96*  97* 91*  92* 86*  87*  --  66* 65*  CREATININE 2.97*  < > 2.73* 3.29*  3.20* 2.57*  2.62* 2.39*  2.43* 2.26* 1.87* 1.88*  CALCIUM 9.8  < > 9.1 8.8*  8.8* 8.8*  8.8* 8.8*  8.6*  --  9.0 9.1  MG  --   --  2.5*  --  2.9*  --   --   --   --   AST 107*  --   --   --  46*  --   --   --   --   ALT 42  --   --   --  29  --   --   --   --   ALKPHOS 71  --   --   --  56  --   --   --   --   BILITOT 0.6  --   --   --  0.7  --   --   --   --   < > = values in this interval not displayed. ------------------------------------------------------------------------------------------------------------------ No results for input(s): CHOL, HDL, LDLCALC, TRIG, CHOLHDL, LDLDIRECT in the last 72 hours.  Lab Results  Component Value Date   HGBA1C 9.4* 07/04/2015    ------------------------------------------------------------------------------------------------------------------ No results for input(s): TSH, T4TOTAL, T3FREE, THYROIDAB in the last 72 hours.  Invalid input(s): FREET3 ------------------------------------------------------------------------------------------------------------------ No results for input(s): VITAMINB12, FOLATE, FERRITIN, TIBC, IRON, RETICCTPCT in the last 72 hours.  Coagulation profile  Recent Labs Lab  07/04/15 1456 07/08/15 0646 07/10/15 0225  INR 1.16 1.11 1.10    No results for input(s): DDIMER in the last 72 hours.  Cardiac Enzymes  Recent Labs Lab 07/04/15 1456 07/04/15 2155 07/05/15 0220  TROPONINI 17.33* 17.98* 24.26*   ------------------------------------------------------------------------------------------------------------------    Component Value Date/Time   BNP 663.6* 07/04/2015 1456    Inpatient Medications  Scheduled Meds: . amLODipine  5 mg Oral Daily  . aspirin EC  81 mg Oral Daily  . calcitRIOL  0.25 mcg Oral Once per day on Mon Wed Fri  . dextrose      . famotidine  20 mg Oral Daily  . ferrous sulfate  325 mg Oral Daily  . heparin  5,000 Units Subcutaneous Q8H  . hydrALAZINE  37.5 mg Oral Q8H  . insulin aspart  0-20 Units Subcutaneous TID WC  . insulin aspart  0-5 Units Subcutaneous QHS  . insulin aspart  6 Units Subcutaneous TID WC  . insulin glargine  70 Units Subcutaneous QHS  . isosorbide mononitrate  60 mg Oral Daily  . latanoprost  1 drop Both Eyes QHS  . metoprolol tartrate  50 mg Oral BID  . ondansetron      . rosuvastatin  40 mg Oral QHS  . sodium chloride flush  3 mL Intravenous Q12H  . ticagrelor  90 mg Oral BID   Continuous Infusions:   PRN Meds:.sodium chloride, acetaminophen, ALPRAZolam, benzonatate, diazepam, nitroGLYCERIN, ondansetron (ZOFRAN) IV, sodium chloride flush, traMADol, zolpidem  Micro Results Recent Results (from the past 240 hour(s))   MRSA PCR Screening     Status: None   Collection Time: 07/04/15  9:50 AM  Result Value Ref Range Status   MRSA by PCR NEGATIVE NEGATIVE Final    Comment:        The GeneXpert MRSA Assay (FDA approved for NASAL specimens only), is one component of a comprehensive MRSA colonization surveillance program. It is not intended to diagnose MRSA infection nor to guide or monitor treatment for MRSA infections.     Radiology Reports Dg Chest Port 1 View  07/10/2015  CLINICAL DATA:  Central line placement EXAM: PORTABLE CHEST 1 VIEW COMPARISON:  07/05/2015 FINDINGS: Left central line is in place. This courses within the left mediastinal shadow and projects over the aortic arch. Exact position tip cannot be confirmed to be venous. Cannot exclude arterial placement. Recommend clinical correlation. No pneumothorax. Bilateral airspace opacities have increased since prior study. Heart is borderline in size. No effusions or acute bony abnormality. IMPRESSION: Left central line tip projects over the the aortic arch in the left superior mediastinum. Cannot confirm venous placement based on imaging. Cannot exclude arterial placement. Recommend clinical correlation. Increasing bilateral airspace disease which could represent edema or infection. Favor edema. Electronically Signed   By: Rolm Baptise M.D.   On: 07/10/2015 11:23   Dg Chest Port 1 View  07/05/2015  CLINICAL DATA:  70 year old female with history of shortness of breath. History of congestive heart failure. EXAM: PORTABLE CHEST 1 VIEW COMPARISON:  Chest x-ray 07/04/2015. FINDINGS: Aeration has improved significantly compared to the prior study, although there continues to be patchy asymmetrically distributed airspace disease predominantly in the left lung base and throughout the right mid to lower lung. Small bilateral pleural effusions. Minimal cephalization of the pulmonary vasculature. Heart size is normal. Upper mediastinal contours are within normal  limits. IMPRESSION: 1. Resolving multifocal airspace disease. This was relatively symmetric in appearance on the prior study, which may indicate resolving pulmonary  edema, however, the appearance is far more asymmetric on today's study, and heart size is normal. Clinical correlation for multilobar bronchopneumonia is suggested. 2. Small bilateral pleural effusions. Electronically Signed   By: Vinnie Langton M.D.   On: 07/05/2015 10:44    Time Spent in minutes  25   Louellen Molder M.D on 07/10/2015 at 3:16 PM  Between 7am to 7pm - Pager - [phone]  After 7pm go to [email or web address] - password [facility]  [facility] -  Office  [phone]

## 2015-07-10 NOTE — Progress Notes (Signed)
Patient has arrived on unit from the OR. VS were stable. Pt. Is actively bleeding bilaterally from incision and IJ placement. Notified Diane, and Camera operator.

## 2015-07-10 NOTE — Progress Notes (Signed)
Silva Bandy PA updated on PACU stay. Verified pt to return to 2W and resume saline lock to central line on arrival to 2 W

## 2015-07-10 NOTE — Interval H&P Note (Signed)
History and Physical Interval Note:  07/10/2015 7:22 AM  [name]  has presented today for surgery, with the diagnosis of Right hand and arm numbness R20.2  The various methods of treatment have been discussed with the patient and family. After consideration of risks, benefits and other options for treatment, the patient has consented to  Procedure(s): BYPASS GRAFT SUBCLAVIAN (Right) as a surgical intervention .  The patient's history has been reviewed, patient examined, no change in status, stable for surgery.  I have reviewed the patient's chart and labs.  Questions were answered to the patient's satisfaction.     Annamarie Major

## 2015-07-10 NOTE — Op Note (Signed)
    Patient name: MONETTA KOSIBA MRN: [id number] DOB: 1945/04/09 Sex: female  07/04/2015 - 07/10/2015 Pre-operative Diagnosis: Right hand numbness Post-operative diagnosis:  Same Surgeon:  Annamarie Major Assistants:  Silva Bandy Procedure:   #1: Right brachial artery exposure   #2: Thromboembolectomy, right subclavian artery Anesthesia:  Gen. Blood Loss:  See anesthesia record Specimens:  Thrombus to pathology  Findings:  Acute thrombus was evacuated.  There was restoration of excellent blood flow  Indications:  The patient was admitted with a end STEMI.  She has undergone coronary stenting.  She has been complaining of right hand numbness for 3 days.  She underwent diagnostic angiography yesterday and was found to have an occlusion of her subclavian artery beyond the origin of the vertebral artery.  She comes in today for thrombectomy versus bypass  Procedure:  The patient was identified in the holding area and taken to [place] 16  The patient was then placed supine on the table. general anesthesia was administered.  The patient was prepped and draped in the usual sterile fashion.  A time out was called and antibiotics were administered.  A longitudinal incision was made up in the right axilla.  Cautery was used to divide the subcutaneous tissue down to the fascia which was opened with cautery.  I then dissected out the brachial artery.  This was a healthy appearing 4 mm artery.  It was nonpulsatile.  9000 units of heparin were administered.  After the heparin circulated, the brachial artery was occluded.  A #11 blade was used to make a transverse arteriotomy.  I then advanced a #4 Fogarty catheter which went easily across the thrombus.  Thromboembolectomy was then performed acute thrombus was evacuated.  After the initial past there was excellent blood flow.  2 additional passes were performed with no residual clot.  There was excellent bleeding from the distal brachial artery.  I then closed  the arteriotomy with 2 running 6-0 proline sutures.  After arteriotomy closure, the patient had restoration of a palpable brachial and radial pulse.  50 mg of protamine was given.  After hemostasis was satisfactory, the incision was closed with 2 layers of 3-0 Vicryl followed by 4-0 Vicryl on the skin and Dermabond.   Disposition:  To PACU in stable condition.   Theotis Burrow, M.D. [facility] [place] Office: [phone] Pager:  [phone]

## 2015-07-10 NOTE — Transfer of Care (Signed)
Immediate Anesthesia Transfer of Care Note  Patient: [name]  Procedure(s) Performed: Procedure(s): RIGHT SUBCLAVIAN ARTERY THROMBECTOMY (Right)  Patient Location: PACU  Anesthesia Type:General  Level of Consciousness: awake, alert , oriented and sedated  Airway & Oxygen Therapy: Patient Spontanous Breathing and Patient connected to face mask oxygen  Post-op Assessment: Report given to RN, Post -op Vital signs reviewed and stable and Patient moving all extremities  Post vital signs: Reviewed and stable  Last Vitals:  Filed Vitals:   07/10/15 0545 07/10/15 1030  BP: 146/62 160/71  Pulse: 61 78  Temp:    Resp: 18 16    Last Pain:  Filed Vitals:   07/10/15 1037  PainSc: 3       Patients Stated Pain Goal: 9 ([id number])  Complications: No apparent anesthesia complications

## 2015-07-10 NOTE — Anesthesia Postprocedure Evaluation (Signed)
Anesthesia Post Note  Patient: [name]  Procedure(s) Performed: Procedure(s) (LRB): RIGHT SUBCLAVIAN ARTERY THROMBECTOMY (Right)  Patient location during evaluation: PACU Anesthesia Type: General Level of consciousness: awake and alert Pain management: pain level controlled Vital Signs Assessment: post-procedure vital signs reviewed and stable Respiratory status: spontaneous breathing, nonlabored ventilation, respiratory function stable and patient connected to nasal cannula oxygen Cardiovascular status: blood pressure returned to baseline and stable Postop Assessment: no signs of nausea or vomiting Anesthetic complications: no    Last Vitals:  Filed Vitals:   07/10/15 1242 07/10/15 1534  BP: 143/52 141/56  Pulse: 79   Temp: 36.7 C   Resp: 16     Last Pain:  Filed Vitals:   07/10/15 1642  PainSc: 0-No pain                 Yerick Eggebrecht DAVID

## 2015-07-10 NOTE — Care Management Note (Signed)
Case Management Note Previous CM note initiated by Donne Anon RN, CM  Patient Details  Name: [name] MRN: [id number] Date of Birth: 11-30-1945  Subjective/Objective:        Adm w mi            Action/Plan: lives w fam, pcp dr hill   Expected Discharge Date:                  Expected Discharge Plan:  Home/Self Care  In-House Referral:     Discharge planning Services  CM Consult, Medication Assistance  Post Acute Care Choice:    Choice offered to:     DME Arranged:    DME Agency:     HH Arranged:    HH Agency:     Status of Service:     Medicare Important Message Given:  Yes Date Medicare IM Given:    Medicare IM give by:    Date Additional Medicare IM Given:    Additional Medicare Important Message give by:     If discussed at [place] of Stay Meetings, dates discussed:    Additional Comments: gave pt 30day free and copay card for brilinta. Pt states still works and has bc[employer] ins for meds.   07/10/15- 1600- Nyhla Mountjoy RN, BSN- pt tx from [facility]- noted above note by previous CM- benefits check submitted for Brilints- coverage per check  Per rep at [employer]:  Brilinta: $40 for 30 day retail/ $80 for 90 day mail order/ no auth required  Patient can use: [facility]   Spoke with pt and shared above coverage regarding Brilinta- pt has 30 day free card-  CM to continue to follow  Dawayne Kyri, RN 07/10/2015, 4:19 PM

## 2015-07-10 NOTE — Progress Notes (Signed)
Lower dentures in cup. Returned to room with pt.  Upper dentures in mouth

## 2015-07-10 NOTE — Progress Notes (Signed)
Dr. Conrad [place] notified of CBG of 62. Orders received. PCXR ordered for central line placement

## 2015-07-11 ENCOUNTER — Inpatient Hospital Stay (HOSPITAL_COMMUNITY): Payer: BLUE CROSS/BLUE SHIELD

## 2015-07-11 DIAGNOSIS — I708 Atherosclerosis of other arteries: Secondary | ICD-10-CM

## 2015-07-11 DIAGNOSIS — Z794 Long term (current) use of insulin: Secondary | ICD-10-CM

## 2015-07-11 DIAGNOSIS — E1122 Type 2 diabetes mellitus with diabetic chronic kidney disease: Secondary | ICD-10-CM

## 2015-07-11 LAB — RENAL FUNCTION PANEL
Albumin: 2.8 g/dL — ABNORMAL LOW (ref 3.5–5.0)
Anion gap: 12 (ref 5–15)
BUN: 53 mg/dL — AB (ref 6–20)
CALCIUM: 8.8 mg/dL — AB (ref 8.9–10.3)
CHLORIDE: 103 mmol/L (ref 101–111)
CO2: 23 mmol/L (ref 22–32)
CREATININE: 1.78 mg/dL — AB (ref 0.44–1.00)
GFR calc non Af Amer: 28 mL/min — ABNORMAL LOW (ref >60)
GFR, EST AFRICAN AMERICAN: 32 mL/min — AB (ref >60)
GLUCOSE: 88 mg/dL (ref 65–99)
Phosphorus: 3.4 mg/dL (ref 2.5–4.6)
Potassium: 4.4 mmol/L (ref 3.5–5.1)
SODIUM: 138 mmol/L (ref 135–145)

## 2015-07-11 LAB — CBC
HCT: 29.7 % — ABNORMAL LOW (ref 36.0–46.0)
Hemoglobin: 9.3 g/dL — ABNORMAL LOW (ref 12.0–15.0)
MCH: 31 pg (ref 26.0–34.0)
MCHC: 31.3 g/dL (ref 30.0–36.0)
MCV: 99 fL (ref 78.0–100.0)
PLATELETS: 147 10*3/uL — AB (ref 150–400)
RBC: 3 MIL/uL — AB (ref 3.87–5.11)
RDW: 16.4 % — AB (ref 11.5–15.5)
WBC: 5.4 10*3/uL (ref 4.0–10.5)

## 2015-07-11 LAB — GLUCOSE, CAPILLARY
GLUCOSE-CAPILLARY: 38 mg/dL — AB (ref 65–99)
GLUCOSE-CAPILLARY: 220 mg/dL — AB (ref 65–99)
Glucose-Capillary: 111 mg/dL — ABNORMAL HIGH (ref 65–99)
Glucose-Capillary: 126 mg/dL — ABNORMAL HIGH (ref 65–99)
Glucose-Capillary: 191 mg/dL — ABNORMAL HIGH (ref 65–99)

## 2015-07-11 MED ORDER — TORSEMIDE 20 MG PO TABS
20.0000 mg | ORAL_TABLET | Freq: Every day | ORAL | Status: DC
  Administered 2015-07-11: 20 mg via ORAL
  Filled 2015-07-11: qty 1

## 2015-07-11 MED ORDER — INSULIN GLARGINE 100 UNIT/ML ~~LOC~~ SOLN
55.0000 [IU] | Freq: Every day | SUBCUTANEOUS | Status: DC
  Administered 2015-07-11: 55 [IU] via SUBCUTANEOUS
  Filled 2015-07-11 (×2): qty 0.55

## 2015-07-11 MED ORDER — DEXTROSE 50 % IV SOLN
INTRAVENOUS | Status: AC
  Administered 2015-07-11: 12.5 g via INTRAVENOUS
  Filled 2015-07-11: qty 50

## 2015-07-11 NOTE — Progress Notes (Addendum)
  [facility] Progress Note  Subjective  - POD #1  Still with some numbness and weakness in right hand, but improved since pre-op  Objective Filed Vitals:   07/11/15 0430 07/11/15 0624  BP: 135/51   Pulse: 68   Temp: 100.1 F (37.8 C) 99.3 F (37.4 C)  Resp: 18     Intake/Output Summary (Last 24 hours) at 07/11/15 [id number] Last data filed at 07/11/15 0100  Gross per 24 hour  Intake    700 ml  Output    400 ml  Net    300 ml   Right axillary incision intact with some surrounding ecchymosis but no hematoma. 2+ right brachial and radial pulses Moderately weakened right grip strength likely due to residual weakness and numbness Right neck previous central line site without hematoma. Dressing removed.   Assessment/Planning: 70 y.o. 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 Numbness will continue to improve with time and is due to prolonged ischemia.  Continue Brilinta. Any further antiplatelet/anticoagulation meds per primary and cardiology.  Ok from vascular surgery standpoint for discharge. Plan per primary.  F/u with Dr. Trula Slade in 2 weeks.   Alvia Grove 07/11/2015 8:08 AM --  Laboratory CBC    Component Value Date/Time   WBC 5.4 07/11/2015 0221   HGB 9.3* 07/11/2015 0221   HCT 29.7* 07/11/2015 0221   PLT 147* 07/11/2015 0221    BMET    Component Value Date/Time   NA 138 07/11/2015 0221   NA 144 06/23/2015 1004   K 4.4 07/11/2015 0221   CL 103 07/11/2015 0221   CO2 23 07/11/2015 0221   GLUCOSE 88 07/11/2015 0221   GLUCOSE 39* 06/23/2015 1004   BUN 53* 07/11/2015 0221   BUN 67* 06/23/2015 1004   CREATININE 1.78* 07/11/2015 0221   CREATININE 1.73* 10/08/2013 1024   CALCIUM 8.8* 07/11/2015 0221   GFRNONAA 28* 07/11/2015 0221   GFRAA 32* 07/11/2015 0221    COAG Lab Results  Component  Value Date   INR 1.10 07/10/2015   INR 1.11 07/08/2015   INR 1.16 07/04/2015   No results found for: PTT  Antibiotics Anti-infectives    Start     Dose/Rate Route Frequency Ordered Stop   07/10/15 0845  cefUROXime (ZINACEF) 1.5 g in dextrose 5 % 50 mL IVPB  Status:  Discontinued     1.5 g 100 mL/hr over 30 Minutes Intravenous To Surgery 07/10/15 0844 07/10/15 1228   07/10/15 0600  cefUROXime (ZINACEF) 1.5 g in dextrose 5 % 50 mL IVPB     1.5 g 100 mL/hr over 30 Minutes Intravenous To ShortStay Surgical 07/09/15 2021 07/10/15 [place], PA-C [facility] Office: [phone] Pager: [phone] 07/11/2015 8:08 AM     I agree with the above Numbness and weakness still present in right hand, but much improved Incision soft with ecchymosis Palpable right radial pulse D/c on ASA and Brilinta Anticipate d/c tomorrow  Annamarie Major

## 2015-07-11 NOTE — Evaluation (Signed)
Physical Therapy Evaluation and Discharge Patient Details Name: [name] MRN: [id number] DOB: 01-Jul-1945 Today's Date: 07/11/2015   History of Present Illness  Adm 4/29 with STEMI and flash pulmonary edema; 5/1 developed RUE pain, numbness and weakness; ultimately diagnosed Rt subclavian occlusion and underwent thrombectomy 5/5. Also with incr Rt knee pain (acute on chronic)  PMHx- s/p 2 stents, DM    Clinical Impression  Patient evaluated by Physical Therapy with no further PT needs identified. Patient reports she has pain in Rt knee that comes and goes "all the time" and that this is no worse than previously. Explained type of knee brace she uses (sleeve) and hospital does not stock those. Only knee brace available is a knee immobilizer that would keep her in extension and she is not interested in that. She was able to use RUE to maneuver RW and ambulated without knee buckling or loss of balance. + mild edema behind patella and ? Could benefit from knee aspiration by Ortho MD. Educated in use of ice and applied. Also educated on AROM for RUE. All education has been completed and the patient has no further questions.  See below for any follow-up Physical Therapy or equipment needs. PT is signing off. Thank you for this referral.     Follow Up Recommendations No PT follow up;Supervision for mobility/OOB    Equipment Recommendations  Rolling walker with 5" wheels    Recommendations for Other Services       Precautions / Restrictions Precautions Precautions: Fall Precaution Comments: due to Rt knee pain Restrictions Weight Bearing Restrictions: No      Mobility  Bed Mobility Overal bed mobility: Needs Assistance Bed Mobility: Supine to Sit     Supine to sit: Min assist     General bed mobility comments: for raising RLE  Transfers Overall transfer level: Needs assistance Equipment used: Rolling walker (2 wheeled);None Transfers: Sit to/from [employer] to Stand: Min guard Stand pivot transfers: Min assist       General transfer comment: on BSC on arrval; pivot to bed without device unsteady due to RT knee pain; with RW much better  Ambulation/Gait Ambulation/Gait assistance: Min guard Ambulation Distance (Feet): 50 Feet Assistive device: Rolling walker (2 wheeled) Gait Pattern/deviations: Step-through pattern;Decreased stride length;Trunk flexed Gait velocity: very slow Gait velocity interpretation: Below normal speed for age/gender General Gait Details: pt able to grip walker enough to steer; no buckling and pt reported pain decreased as she ambulated  Stairs            Wheelchair Mobility    Modified Rankin (Stroke Patients Only)       Balance Overall balance assessment: Needs assistance         Standing balance support: Single extremity supported Standing balance-[name]: Poor Standing balance comment: due to knee pain                             Pertinent Vitals/Pain SaO2 98-99% on room air  Pain Assessment: Faces Faces Pain Scale: Hurts even more Pain Location: Rt knee > RUE (when weight bearing) Pain Descriptors / Indicators: Sharp Pain Intervention(s): Limited activity within patient's tolerance;Monitored during session;Repositioned;Ice applied    Home Living Family/patient expects to be discharged to:: Private residence Living Arrangements: Spouse/significant other;Children Available Help at Discharge: Family;Available 24 hours/day Type of Home: House Home Access: Level entry     Home Layout: One level Home Equipment: Bedside commode  Prior Function Level of Independence: Independent         Comments: drives school bus     Hand Dominance   Dominant Hand: Right    Extremity/Trunk Assessment   Upper Extremity Assessment: RUE deficits/detail RUE Deficits / Details: AAROM wfl; triceps 4/5, wrist extension 2+; grip 2+   RUE Sensation: decreased  light touch     Lower Extremity Assessment: RLE deficits/detail RLE Deficits / Details: AAROM Rt knee full extension to 90 flexion (increasing pain with incr flexion); ankle DF 5/5; palpable edema at patella/joint line    Cervical / Trunk Assessment: Normal  Communication   Communication: No difficulties  Cognition Arousal/Alertness: Awake/alert Behavior During Therapy: WFL for tasks assessed/performed Overall Cognitive Status: Within Functional Limits for tasks assessed                      General Comments General comments (skin integrity, edema, etc.): Educated in AROM exercises for RUE for edema management    Exercises        Assessment/Plan    PT Assessment Patent does not need any further PT services  PT Diagnosis Difficulty walking;Acute pain   PT Problem List    PT Treatment Interventions     PT Goals (Current goals can be found in the Care Plan section) Acute Rehab PT Goals Patient Stated Goal: go home tomorrow PT Goal Formulation: All assessment and education complete, DC therapy    Frequency     Barriers to discharge        Co-evaluation               End of Session Equipment Utilized During Treatment: Gait belt Activity Tolerance: Patient tolerated treatment well Patient left: in bed;with call bell/phone within reach;with family/visitor present           Time: [id number] PT Time Calculation (min) (ACUTE ONLY): 31 min   Charges:   PT Evaluation $PT Eval Low Complexity: 1 Procedure PT Treatments $Therapeutic Exercise: 8-22 mins   PT G Codes:        [name] 2015/07/15, 11:26 AM Pager [phone]

## 2015-07-11 NOTE — Progress Notes (Signed)
PROGRESS NOTE                                                                                                                                                                                                             Patient Demographics:    [name], is a 70 y.o. female, DOB - June 27, 1945, [id number]  Admit date - 07/04/2015   Admitting Physician Minus Breeding, MD  Outpatient Primary MD for the patient is Maggie Font, MD  LOS - 7  Outpatient Specialists: Cardiology Dr Bronson Ing Gastroenterology Dr. Oneida Alar Nephrology: Dr. Lorrene Reid Vascular surgery: Dr. Annamarie Major  Chief complaint: Shortness of breath  Brief Narrative   70 y.o. female with a history of CAD S/P DES to circumflex lesion in 2008 (multivessel disease noted at that time involving the LAD, 1st diagonal; historicaly, she has had a preserved EF), IDDM, HTN, dyslipidemia, and GERD presented at [facility] ED with progressive shortness of breath and worsening cough. She was found to have NSTEMI and transferred to [facility]. She was also found to have flash pulmonary edema requiring BiPAP. Patient underwent cardiac catheterization with mid RCA and posterior PDA stenting. Hospital course prolonged due to worsened renal function and  finding of subclavian steal syndrome. S/P right subclavian artery thromboembolectomy on 07/10/2015   Subjective:  " My right hand is still numb and weak."  Also continues to have pain in her right knee.  No history of gout.  She has a history of OA.  Typically wears a knee brace at baseline.  Low grade post-operative fever noted.    Assessment  & Plan :    Principal Problem:    NSTEMI (non-ST elevated myocardial infarction) ([place]) -Status post left heart catheterization, Mid RCA and posterior PDA stenting. Peak troponin 24 -dual antiplatelet. ( aspirin and Brilinta) May require viability study in the LCx territory with a  planned staged intervention for chronic total occlusion. Cardiology plan on medical management for now given her underlying renal function. Continue Crestor , Lopressor,  and Imdur.   Active Problems: Distal right subclavian artery occlusion Found incidentally prior to cath as patient has decreased radial pulsation. Patient reports numbness in her right hand for several weeks and now has weakness with some wristdrop in her right hand. Appeared to be chronic total occlusion . Arteriogram done with her right arm angiogram  which shows patent proximal right subclavian artery with distal right subclavian artery occlusion. S/P right subclavian artery thromboembolectomy on 07/10/2015. Tolerated procedure well. Has improved right radial pulse and decreased numbness postoperatively, but RUE function is still not back to baseline.  Will request OT eval and treat.  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 Diuresed with IV Lasix but given worsened renal function it was discontinued. She is currently euvolemic. Continue aspirin, beta blocker, hydralazine, Imdur and statin. Not on ACEi/ ARB due to renal disease. Chest xray 5/5 still shows bilateral infiltrates, favor edema, she may need another dose of IV lasix today/tomorrow or resume oral doses.  Defer to nephrology for now.  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 Follows with Dr. Lorrene Reid. Renal function worsened with diuretics and post cardiac cath. Lasix  discontinued. Creatinine now improving to normal.  Cr. Stable at 1.78 today.  Consider resuming diuretics. Renal following.  Acute on chronic anemia Drop in H&H of almost 2 g on 5/4. Patient tells me that she received a total of three units of PRBCs.  Stool for occult blood pending. Now off IV heparin drip. Hgb stable at 9.3 but she may drop again  after post-operative bleeding yesterday.  Multifocal airspace disease on chest x-ray Possibly due to pulmonary edema. No signs of pneumonia.  Consider resuming diuretics.  Oxygenation is stable. Incentive spirometer.  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 Recurrent hypoglycemia likely related to NPO status for surgery.  Will watch trend today before making adjustments but probably needs a lower long-acting dose in the evening.  Right knee joint pain Possibly in the setting of osteoarthritis. Mild swelling apparent.  Will check xray.  Request PT and knee brace.  Increase activity today.  Analgesics as needed.  Code Status : Full code  Family Communication  : Daughter at bedside today.  Disposition Plan  : Home once symptoms better. Possibly tomorrow if stable and consultants are in agreement.  Case management request placed for outpatient PT/OT.  Barriers For Discharge :  Stable labs, plan for diuretics, appropriate outpatient referrals in place  Consults  :   Cardiology Vascular surgery Nephrology  Procedures  :  2-D echo Cardiac cath Right upper extremity Doppler Carotid Doppler Right arm angiogram Right subclavian thromboembolectomy  DVT Prophylaxis  : Subcutaneous heparin   Lab Results  Component Value Date   PLT 147* 07/11/2015    Antibiotics  :  NONE  Anti-infectives    Start     Dose/Rate Route Frequency Ordered Stop   07/10/15 0845  cefUROXime (ZINACEF) 1.5 g in dextrose 5 % 50 mL IVPB  Status:  Discontinued     1.5 g 100 mL/hr over 30 Minutes Intravenous To Surgery 07/10/15 0844 07/10/15 1228   07/10/15 0600  cefUROXime (ZINACEF) 1.5 g in dextrose 5 % 50 mL IVPB     1.5 g 100 mL/hr over 30 Minutes Intravenous To ShortStay Surgical 07/09/15 2021 07/10/15 0849        Objective:   Filed Vitals:   07/10/15 1641 07/10/15 2109 07/11/15 0430 07/11/15 0624  BP:  148/59 135/51   Pulse:  76 68   Temp:  98.8 F (37.1 C) 100.1 F (37.8 C)  99.3 F (37.4 C)  TempSrc:  Oral Oral Oral  Resp:  18 18   Height:  Weight:    104.055 kg (229 lb 6.4 oz)  SpO2: 100% 100% 99%     Wt Readings from Last 3 Encounters:  07/11/15 104.055 kg (229 lb 6.4 oz)  06/30/15 102.059 kg (225 lb)  03/30/15 102.059 kg (225 lb)     Intake/Output Summary (Last 24 hours) at 07/11/15 0812 Last data filed at 07/11/15 0100  Gross per 24 hour  Intake    700 ml  Output    400 ml  Net    300 ml     Physical Exam  Gen: awake and alert, NAD HEENT: Mucous membranes slightly dry, left IJ in place, compressive dressing in place to right neck Pulm: Bibasilar crackles CVS: Normal rate, regular rhythm, no murmur.  Trace pretibial edema in both legs/ankles. GI: soft, NT, ND, BS+ Musculoskeletal: small right knee effusion, mild warmth, no significant tenderness.  Right hand is also swollen. CNS: right hand weakness with decreased grip    Data Review:    CBC  Recent Labs Lab 07/04/15 1456  07/06/15 0822  07/08/15 0646 07/09/15 0242 07/09/15 1145 07/10/15 0225 07/11/15 0221  WBC 7.8  < > 7.4  < > 5.6 6.0 6.6 5.3 5.4  HGB 11.0*  < > 9.8*  < > 8.7* 6.6* 8.7* 9.9* 9.3*  HCT 35.6*  < > 31.3*  < > 27.5* 20.9* 28.4* 31.9* 29.7*  PLT 171  < > 169  < > 153 160 160 171 147*  MCV 102.0*  < > 101.6*  < > 100.7* 102.5* 99.6 97.6 99.0  MCH 31.5  < > 31.8  < > 31.9 32.4 30.5 30.3 31.0  MCHC 30.9  < > 31.3  < > 31.6 31.6 30.6 31.0 31.3  RDW 14.5  < > 14.5  < > 14.3 14.4 14.4 16.7* 16.4*  LYMPHSABS 0.9  --  1.2  --  0.9  --   --   --   --   MONOABS 0.6  --  0.7  --  0.4  --   --   --   --   EOSABS 0.0  --  0.1  --  0.2  --   --   --   --   BASOSABS 0.0  --  0.0  --  0.0  --   --   --   --   < > = values in this interval not displayed.  Chemistries   Recent Labs Lab 07/04/15 1456  07/06/15 0256  07/08/15 0646 07/09/15 0242 07/09/15 1145 07/10/15 0225 07/10/15 0300 07/11/15 0221  NA 136  < > 138  < > 133*  133* 135  134*  --  137 139 138   K 4.3  < > 3.8  < > 4.0  4.0 3.9  4.0  --  4.6 4.5 4.4  CL 94*  < > 98*  < > 93*  94* 96*  95*  --  102 101 103  CO2 26  < > 27  < > 28  28 28  27   --  23 23 23   GLUCOSE 496*  < > 143*  < > 254*  259* 257*  254*  --  156* 155* 88  BUN 78*  < > 87*  < > 91*  92* 86*  87*  --  66* 65* 53*  CREATININE 2.97*  < > 2.73*  < > 2.57*  2.62* 2.39*  2.43* 2.26* 1.87* 1.88* 1.78*  CALCIUM 9.8  < > 9.1  < >  8.8*  8.8* 8.8*  8.6*  --  9.0 9.1 8.8*  MG  --   --  2.5*  --  2.9*  --   --   --   --   --   AST 107*  --   --   --  46*  --   --   --   --   --   ALT 42  --   --   --  29  --   --   --   --   --   ALKPHOS 71  --   --   --  56  --   --   --   --   --   BILITOT 0.6  --   --   --  0.7  --   --   --   --   --   < > = values in this interval not displayed. ------------------------------------------------------------------------------------------------------------------  Lab Results  Component Value Date   HGBA1C 9.4* 07/04/2015   Coagulation profile  Recent Labs Lab 07/04/15 1456 07/08/15 0646 07/10/15 0225  INR 1.16 1.11 1.10   Cardiac Enzymes  Recent Labs Lab 07/04/15 1456 07/04/15 2155 07/05/15 0220  TROPONINI 17.33* 17.98* 24.26*   ------------------------------------------------------------------------------------------------------------------    Component Value Date/Time   BNP 663.6* 07/04/2015 1456    Inpatient Medications  Scheduled Meds: . amLODipine  5 mg Oral Daily  . aspirin EC  81 mg Oral Daily  . calcitRIOL  0.25 mcg Oral Once per day on Mon Wed Fri  . famotidine  20 mg Oral Daily  . ferrous sulfate  325 mg Oral Daily  . heparin  5,000 Units Subcutaneous Q8H  . hydrALAZINE  37.5 mg Oral Q8H  . insulin aspart  0-20 Units Subcutaneous TID WC  . insulin aspart  0-5 Units Subcutaneous QHS  . insulin aspart  6 Units Subcutaneous TID WC  . insulin glargine  70 Units Subcutaneous QHS  . isosorbide mononitrate  60 mg Oral Daily  . latanoprost  1  drop Both Eyes QHS  . metoprolol tartrate  50 mg Oral BID  . rosuvastatin  40 mg Oral QHS  . sodium chloride flush  3 mL Intravenous Q12H  . ticagrelor  90 mg Oral BID   Continuous Infusions:   PRN Meds:.sodium chloride, acetaminophen, ALPRAZolam, benzonatate, diazepam, nitroGLYCERIN, ondansetron (ZOFRAN) IV, sodium chloride flush, traMADol, zolpidem  Micro Results Recent Results (from the past 240 hour(s))  MRSA PCR Screening     Status: None   Collection Time: 07/04/15  9:50 AM  Result Value Ref Range Status   MRSA by PCR NEGATIVE NEGATIVE Final    Comment:        The GeneXpert MRSA Assay (FDA approved for NASAL specimens only), is one component of a comprehensive MRSA colonization surveillance program. It is not intended to diagnose MRSA infection nor to guide or monitor treatment for MRSA infections.     Radiology Reports Dg Chest Port 1 View  07/10/2015  CLINICAL DATA:  Central line placement EXAM: PORTABLE CHEST 1 VIEW COMPARISON:  07/05/2015 FINDINGS: Left central line is in place. This courses within the left mediastinal shadow and projects over the aortic arch. Exact position tip cannot be confirmed to be venous. Cannot exclude arterial placement. Recommend clinical correlation. No pneumothorax. Bilateral airspace opacities have increased since prior study. Heart is borderline in size. No effusions or acute bony abnormality. IMPRESSION: Left central line tip projects over the the aortic arch in  the left superior mediastinum. Cannot confirm venous placement based on imaging. Cannot exclude arterial placement. Recommend clinical correlation. Increasing bilateral airspace disease which could represent edema or infection. Favor edema. Electronically Signed   By: Rolm Baptise M.D.   On: 07/10/2015 11:23   Dg Chest Port 1 View  07/05/2015  CLINICAL DATA:  70 year old female with history of shortness of breath. History of congestive heart failure. EXAM: PORTABLE CHEST 1 VIEW  COMPARISON:  Chest x-ray 07/04/2015. FINDINGS: Aeration has improved significantly compared to the prior study, although there continues to be patchy asymmetrically distributed airspace disease predominantly in the left lung base and throughout the right mid to lower lung. Small bilateral pleural effusions. Minimal cephalization of the pulmonary vasculature. Heart size is normal. Upper mediastinal contours are within normal limits. IMPRESSION: 1. Resolving multifocal airspace disease. This was relatively symmetric in appearance on the prior study, which may indicate resolving pulmonary edema, however, the appearance is far more asymmetric on today's study, and heart size is normal. Clinical correlation for multilobar bronchopneumonia is suggested. 2. Small bilateral pleural effusions. Electronically Signed   By: Vinnie Langton M.D.   On: 07/05/2015 10:44    Time Spent in minutes:  30 minutes   Eber Jones M.D on 07/11/2015 at 8:12 AM   [facility] -  Office  [phone]

## 2015-07-11 NOTE — Progress Notes (Addendum)
Hypoglycemic Event  CBG: 38  Treatment: Dextrose 50   Symptoms: none  Follow-up CBG: Time: 0658 CBG Result: 111  Possible Reasons for Event: Too much insulin at HS  Comments/MD notified: yes    [name], [employer]

## 2015-07-11 NOTE — Progress Notes (Signed)
Came this am and pt was with PT. Now sound asleep. She can walk with nursing. Will f/u Monday if she is still here. Yves Dill CES, ACSM 3:10 PM 07/11/2015

## 2015-07-11 NOTE — Progress Notes (Signed)
Subjective:  Denies CP or dyspnea  Objective:   Vital Signs : Filed Vitals:   07/10/15 1641 07/10/15 2109 07/11/15 0430 07/11/15 0624  BP:  148/59 135/51   Pulse:  76 68   Temp:  98.8 F (37.1 C) 100.1 F (37.8 C) 99.3 F (37.4 C)  TempSrc:  Oral Oral Oral  Resp:  18 18   Height:      Weight:    229 lb 6.4 oz (104.055 kg)  SpO2: 100% 100% 99%     Intake/Output from previous day:  Intake/Output Summary (Last 24 hours) at 07/11/15 0811 Last data filed at 07/11/15 0100  Gross per 24 hour  Intake    700 ml  Output    400 ml  Net    300 ml    I/O since admission: -4985  Wt Readings from Last 3 Encounters:  07/11/15 229 lb 6.4 oz (104.055 kg)  06/30/15 225 lb (102.059 kg)  03/30/15 225 lb (102.059 kg)    Medications: . amLODipine  5 mg Oral Daily  . aspirin EC  81 mg Oral Daily  . calcitRIOL  0.25 mcg Oral Once per day on Mon Wed Fri  . famotidine  20 mg Oral Daily  . ferrous sulfate  325 mg Oral Daily  . heparin  5,000 Units Subcutaneous Q8H  . hydrALAZINE  37.5 mg Oral Q8H  . insulin aspart  0-20 Units Subcutaneous TID WC  . insulin aspart  0-5 Units Subcutaneous QHS  . insulin aspart  6 Units Subcutaneous TID WC  . insulin glargine  70 Units Subcutaneous QHS  . isosorbide mononitrate  60 mg Oral Daily  . latanoprost  1 drop Both Eyes QHS  . metoprolol tartrate  50 mg Oral BID  . rosuvastatin  40 mg Oral QHS  . sodium chloride flush  3 mL Intravenous Q12H  . ticagrelor  90 mg Oral BID       Physical Exam:   BP LEFT ARM: 145/68 ; BP RIGHT ARM: 52/25 during vascular doppler evaluation on 07/07/15  General appearance: alert, cooperative and no distress Neck: supple Lungs: CTA Heart: RRR Abdomen: soft, non-tender; not distended Extremities: trace to 1+ edema Pulses: 1+ right radial Neurologic: Grossly normal   Rate: 68  Rhythm: normal sinus rhythm   Lab Results:   Recent Labs  07/09/15 0242  07/10/15 0225 07/10/15 0300 07/11/15 0221  NA  135  134*  --  137 139 138  K 3.9  4.0  --  4.6 4.5 4.4  CL 96*  95*  --  102 101 103  CO2 28  27  --  '23 23 23  '$ GLUCOSE 257*  254*  --  156* 155* 88  BUN 86*  87*  --  66* 65* 53*  CREATININE 2.39*  2.43*  < > 1.87* 1.88* 1.78*  CALCIUM 8.8*  8.6*  --  9.0 9.1 8.8*  PHOS 3.8  --   --  3.2 3.4  < > = values in this interval not displayed.  Hepatic Function Latest Ref Rng 07/11/2015 07/10/2015 07/09/2015  Total Protein 6.5 - 8.1 g/dL - - -  Albumin 3.5 - 5.0 g/dL 2.8(L) 3.0(L) 2.9(L)  AST 15 - 41 U/L - - -  ALT 14 - 54 U/L - - -  Alk Phosphatase 38 - 126 U/L - - -  Total Bilirubin 0.3 - 1.2 mg/dL - - -     Recent Labs  07/09/15 1145 07/10/15 0225 07/11/15 0221  WBC 6.6 5.3 5.4  HGB 8.7* 9.9* 9.3*  HCT 28.4* 31.9* 29.7*  MCV 99.6 97.6 99.0  PLT 160 171 147*      Lab Results  Component Value Date   TSH 0.904 07/06/2015    Recent Labs  07/09/15 0242 07/10/15 0300 07/11/15 0221  ALBUMIN 2.9* 3.0* 2.8*    Recent Labs  07/10/15 0225  INR 1.10   BNP (last 3 results)  Recent Labs  07/04/15 1456  BNP 663.6*    Lipid Panel     Component Value Date/Time   CHOL 160 07/07/2015 0259   TRIG 119 07/07/2015 0259   HDL 40* 07/07/2015 0259   CHOLHDL 4.0 07/07/2015 0259   VLDL 24 07/07/2015 0259   LDLCALC 96 07/07/2015 0259      Imaging:  Urgent Cardiac Cath on 07/04/2015 Conclusion     Mid LAD lesion, 60% stenosed.  Prox Cx to Mid Cx lesion, 99% stenosed. The lesion was previously treated with a stent (unknown type).  Ost Cx to Prox Cx lesion, 75% stenosed.  Mid RCA lesion, 85% stenosed. Post intervention, there is a 0% residual stenosis.  Ost RPDA lesion, 99% stenosed. Post intervention, there is a 0% residual stenosis.  Multivessel CAD with diffusely irregular LAD system with 60% stenosis between the first and second diagonal vessel, subtotal/chronic total occlusion of the previously placed tandem circumflex stents with antegrade filling  distally with TIMI 2 flow and small caliber diffusely narrowed ostial proximal circumflex prior to the stented segment; and a dominant RCA with eccentric 80-85% mid stenoses and 99% ostial PDA stenosis.  Successful PCI with ultimate insertion of a 2.514 mm Resolute DES stent into the PDA vessel, postdilated to 2.62 mm with a 99% stenosis being reduced to 0% and no impairment to flow in the jailed continuation branch/PLA vessel, and angiosculpt scoring balloon/DES stenting with a 2.518 mm Resolute stent postdilated to 2.75 mm in the mid RCA with the 85% stenosis being reduced to 0%.  LV pressure 143/28/37  RECOMMENDATION: Continued DAPT indefinitely The patient will continue with bivalirudin for several hours post procedure, particularly in light of the residual subtotal circumflex stenosis. A 2-D echo Doppler study will be obtained to assess LV function. Renal function will be closely monitored. Patient is at risk for subsequent dialysis. If viability is present in the circumflex territory, consider staged intervention of this subtotal/possible chronic total occlusion.         ------------------------------------------------------------------- 07/06/15 ECHO Study Conclusions  - Procedure narrative: Transthoracic echocardiography. Image  quality was poor. The study was technically difficult, as a  result of poor acoustic windows, poor sound wave transmission,  and breast implants. - Left ventricle: The cavity size was normal. There was moderate  concentric hypertrophy. Systolic function was mildly to  moderately reduced. The estimated ejection fraction was in the  range of 40% to 45%. Hypokinesis of the mid-apicalanterolateral  and inferolateral myocardium; consistent with ischemia in the  distribution of the left circumflex coronary artery. Possible  hypokinesis of the mid-apicalanterior myocardium; in the  distribution of the left anterior descending coronary artery.   (Anterior wall not as well visualized). Features are consistent  with a pseudonormal left ventricular filling pattern, with  concomitant abnormal relaxation and increased filling pressure  (grade 2 diastolic dysfunction). - Mitral valve: There was mild regurgitation. - Left atrium: The atrium was mildly dilated.  Duplex Final Summary:  - The vertebral arteries appear patent with antegrade flow. - Abnormal right subclavian artery. - Findings consistent  with 1-39 percent stenosis involving the  right internal carotid artery and the left internal carotid  artery.  Assessment/Plan:   Principal Problem:   NSTEMI (non-ST elevated myocardial infarction) ([place]) Active Problems:   Hyperlipemia   Essential hypertension   CORONARY ATHEROSCLEROSIS NATIVE CORONARY ARTERY   Type 2 diabetes mellitus with stage 4 chronic kidney disease (HCC)   Acute CHF (congestive heart failure) (HCC)   Acute renal failure superimposed on stage 4 chronic kidney disease (HCC)   Elevated d-dimer   Acute on chronic respiratory failure with hypoxia and hypercapnia (HCC)   Subclavian artery stenosis, right   1. S/p PCI to subtotal ostial PDA stenosis and mid RCA with DES stenting.  No recurrent chest pain or dyspnea. Subtotal /chronic CTO of LCX stent with faint antegrade flow. Will treat medically. Continue ASA, brilinta and statin. Continue metoprolol.  2. CKD: Renal function improving; appears to be volume overloaded; resume demadex 20 mg daily. Repeat BMET in AM.  3. S/p right subclavian artery thrombectomy-per vascular surgery.  3. Progressive anemia; improved with transfusion.  4. HTN:  Continue present meds  5. 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 Resume demadex 20 mg dialy.  6. HLD: continue statin.  7. DM   Kirk Ruths MD  07/11/2015, 8:11 AM

## 2015-07-12 DIAGNOSIS — I998 Other disorder of circulatory system: Secondary | ICD-10-CM | POA: Insufficient documentation

## 2015-07-12 DIAGNOSIS — I5041 Acute combined systolic (congestive) and diastolic (congestive) heart failure: Secondary | ICD-10-CM

## 2015-07-12 DIAGNOSIS — E785 Hyperlipidemia, unspecified: Secondary | ICD-10-CM

## 2015-07-12 DIAGNOSIS — I10 Essential (primary) hypertension: Secondary | ICD-10-CM

## 2015-07-12 DIAGNOSIS — M25561 Pain in right knee: Secondary | ICD-10-CM

## 2015-07-12 LAB — BASIC METABOLIC PANEL
Anion gap: 10 (ref 5–15)
BUN: 52 mg/dL — ABNORMAL HIGH (ref 6–20)
CHLORIDE: 104 mmol/L (ref 101–111)
CO2: 22 mmol/L (ref 22–32)
Calcium: 8.7 mg/dL — ABNORMAL LOW (ref 8.9–10.3)
Creatinine, Ser: 1.76 mg/dL — ABNORMAL HIGH (ref 0.44–1.00)
GFR calc non Af Amer: 28 mL/min — ABNORMAL LOW (ref >60)
GFR, EST AFRICAN AMERICAN: 33 mL/min — AB (ref >60)
Glucose, Bld: 158 mg/dL — ABNORMAL HIGH (ref 65–99)
POTASSIUM: 4.3 mmol/L (ref 3.5–5.1)
SODIUM: 136 mmol/L (ref 135–145)

## 2015-07-12 LAB — CBC
HEMATOCRIT: 30.3 % — AB (ref 36.0–46.0)
HEMOGLOBIN: 9.2 g/dL — AB (ref 12.0–15.0)
MCH: 29.9 pg (ref 26.0–34.0)
MCHC: 30.4 g/dL (ref 30.0–36.0)
MCV: 98.4 fL (ref 78.0–100.0)
Platelets: 175 10*3/uL (ref 150–400)
RBC: 3.08 MIL/uL — AB (ref 3.87–5.11)
RDW: 15.9 % — ABNORMAL HIGH (ref 11.5–15.5)
WBC: 6.3 10*3/uL (ref 4.0–10.5)

## 2015-07-12 LAB — RENAL FUNCTION PANEL
ALBUMIN: 2.9 g/dL — AB (ref 3.5–5.0)
ANION GAP: 10 (ref 5–15)
BUN: 49 mg/dL — ABNORMAL HIGH (ref 6–20)
CALCIUM: 8.8 mg/dL — AB (ref 8.9–10.3)
CO2: 23 mmol/L (ref 22–32)
Chloride: 104 mmol/L (ref 101–111)
Creatinine, Ser: 1.85 mg/dL — ABNORMAL HIGH (ref 0.44–1.00)
GFR calc non Af Amer: 26 mL/min — ABNORMAL LOW (ref >60)
GFR, EST AFRICAN AMERICAN: 31 mL/min — AB (ref >60)
GLUCOSE: 156 mg/dL — AB (ref 65–99)
PHOSPHORUS: 2.8 mg/dL (ref 2.5–4.6)
POTASSIUM: 4.3 mmol/L (ref 3.5–5.1)
SODIUM: 137 mmol/L (ref 135–145)

## 2015-07-12 LAB — GLUCOSE, CAPILLARY
GLUCOSE-CAPILLARY: 80 mg/dL (ref 65–99)
GLUCOSE-CAPILLARY: 113 mg/dL — AB (ref 65–99)
GLUCOSE-CAPILLARY: 182 mg/dL — AB (ref 65–99)

## 2015-07-12 MED ORDER — HYDRALAZINE HCL 25 MG PO TABS
37.5000 mg | ORAL_TABLET | Freq: Three times a day (TID) | ORAL | Status: DC
Start: 2015-07-12 — End: 2015-12-03

## 2015-07-12 MED ORDER — INSULIN DEGLUDEC 100 UNIT/ML ~~LOC~~ SOPN: 50.0000 [IU] | PEN_INJECTOR | Freq: Every day | SUBCUTANEOUS | Status: DC

## 2015-07-12 MED ORDER — TICAGRELOR 90 MG PO TABS: 90.0000 mg | ORAL_TABLET | Freq: Two times a day (BID) | ORAL | Status: DC

## 2015-07-12 MED ORDER — AMLODIPINE BESYLATE 5 MG PO TABS: 5.0000 mg | ORAL_TABLET | Freq: Every day | ORAL | Status: DC

## 2015-07-12 MED ORDER — ACETAMINOPHEN 500 MG PO TABS: 1000.0000 mg | ORAL_TABLET | Freq: Four times a day (QID) | ORAL | Status: DC | PRN

## 2015-07-12 MED ORDER — METOPROLOL TARTRATE 50 MG PO TABS: 50.0000 mg | ORAL_TABLET | Freq: Two times a day (BID) | ORAL | Status: DC

## 2015-07-12 MED ORDER — DOCUSATE SODIUM 100 MG PO CAPS
100.0000 mg | ORAL_CAPSULE | Freq: Two times a day (BID) | ORAL | Status: DC
  Administered 2015-07-12: 100 mg via ORAL
  Filled 2015-07-12: qty 1

## 2015-07-12 MED ORDER — BISACODYL 10 MG RE SUPP
10.0000 mg | Freq: Once | RECTAL | Status: DC
  Filled 2015-07-12: qty 1

## 2015-07-12 MED ORDER — ISOSORBIDE MONONITRATE ER 60 MG PO TB24: 60.0000 mg | ORAL_TABLET | Freq: Every day | ORAL | Status: DC

## 2015-07-12 MED ORDER — TORSEMIDE 20 MG PO TABS
20.0000 mg | ORAL_TABLET | Freq: Two times a day (BID) | ORAL | Status: DC
  Administered 2015-07-12 (×2): 20 mg via ORAL
  Filled 2015-07-12 (×2): qty 1

## 2015-07-12 MED ORDER — POLYETHYLENE GLYCOL 3350 17 G PO PACK
17.0000 g | PACK | Freq: Once | ORAL | Status: DC
  Filled 2015-07-12: qty 1

## 2015-07-12 MED ORDER — ROSUVASTATIN CALCIUM 40 MG PO TABS: 40.0000 mg | ORAL_TABLET | Freq: Every day | ORAL | Status: DC

## 2015-07-12 MED ORDER — NITROGLYCERIN 0.4 MG SL SUBL: 0.4000 mg | SUBLINGUAL_TABLET | SUBLINGUAL | Status: DC | PRN

## 2015-07-12 NOTE — Progress Notes (Signed)
Patient ID: [name], female   DOB: [birth year], 70 y.o.   MRN: [id number] I was asked to come by and see Ms. Goodwine about her painful right knee.  An x-ray was obtained that suggests pseudogout.  This is likely just arthritic changes.  She does have a mild right knee effusion and it does hurt with motion. I will come by later this am and aspirate the fluid from her knee and place a small steroid injection in the knee that will help her mobilize better.  She understands this and agrees.

## 2015-07-12 NOTE — Progress Notes (Signed)
2 view Xray confirm the possibility of CVC not being in the vein, MD notified

## 2015-07-12 NOTE — Progress Notes (Signed)
Patient ID: [name], female   DOB: [birth year], 70 y.o.   MRN: [id number] I was able to come by and aspirate a small amount of fluid from her right knee and then place and injection of lidocaine and depomedrol into the knee.  She tolerated this well.  I gave her my card and told her she can call me anytime for follow-up as an outpatient if she has any issues.

## 2015-07-12 NOTE — Discharge Summary (Signed)
Physician Discharge Summary  [name] [id number] DOB: [birth year] DOA: 07/04/2015  PCP: Maggie Font, MD  Admit date: 07/04/2015 Discharge date: 07/12/2015  Time spent: 40 minutes  Recommendations for Outpatient Follow-up:  1.  Patient knows that she MUST pick up her Brilinta TONIGHT and take her evening dose.  Prescription faxed to her pharmacy prior to discharge.  Her husband verified that they were received and being filled prior to discharge. 2.  Needs repeat BMP by the end of the week. 3.  She can follow-up with Orthopedic Surgery as needed. 4.  She can see her nephrologist per routine. 5.  Cardiology and Vascular surgery follow-ups listed below. 6.  Will ask case manager to refer to outpatient PT again.  Even though the patient lives in [place], she receives all of her care in [place].  She would even be amenable to rehab services through [facility], if this is available.  Discharge Diagnoses:  Principal Problem:   NSTEMI (non-ST elevated myocardial infarction) ([place]) Active Problems:   Hyperlipemia   Essential hypertension   CORONARY ATHEROSCLEROSIS NATIVE CORONARY ARTERY   Type 2 diabetes mellitus with stage 4 chronic kidney disease (HCC)   Acute CHF (congestive heart failure) (HCC)   Elevated d-dimer   Subclavian artery stenosis, right   Ischemia of upper extremity   Right knee pain  Discharge Condition: Stable  Diet recommendation: heart healthy, diabetic, 2L fluid restriction  Filed Weights   07/09/15 0400 07/10/15 0347 07/11/15 0624  Weight: 104.2 kg (229 lb 11.5 oz) 104.8 kg (231 lb 0.7 oz) 104.055 kg (229 lb 6.4 oz)   History of present illness:  70 y.o. female with a history of CAD S/P DES to circumflex lesion in 2008 (multivessel disease noted at that time involving the LAD, 1st diagonal; historicaly, she has had a preserved EF), IDDM, HTN, dyslipidemia, and GERD presented at [facility] ED with progressive shortness of breath and worsening cough.  She was found to have NSTEMI and transferred to [facility]. She was also found to have flash pulmonary edema requiring BiPAP. Patient underwent cardiac catheterization with mid RCA and posterior PDA stenting. Hospital course prolonged due to worsened renal function andfinding of subclavian steal syndrome. S/P right subclavian artery thromboembolectomy on 07/10/2015  Hospital Course:  Principal Problem:   NSTEMI (non-ST elevated myocardial infarction) ([place]) -Status post left heart catheterization, Mid RCA and posterior PDA stenting. Peak troponin 24 -dual antiplatelet. (aspirin and Brilinta) Medical management for now.  Must consider renal function before pursuing any further interventions Cardiac Regimen: ASA, BRILINTA (stop plavix), Beta Blocker, Imdur/Hyradalzine, high dose statin  Active Problems: Distal right subclavian artery occlusion Found incidentally prior to cath as patient had decreased radial pulsation, decreased grip strength with right wrist drop, swelling, and numbness in her right hand. Arteriogram done with her right arm which shows patent proximal right subclavian artery with distal right subclavian artery occlusion. S/P right subclavian artery thromboembolectomy on 07/10/2015. Tolerated procedure well. Has improved right radial pulse and decreased numbness postoperatively, but RUE function is still not back to baseline.  Will refer for outpatient PT, as noted above.  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 Diuresed with IV Lasix as an inpatient.  Resume oral torsemide at discharge. Continue aspirin, beta blocker, hydralazine, Imdur and statin. Not on ACEi/ ARB due to renal disease.  Acute on chronic any disease stage -4 Reports her baseline  creatinine around 2.6. Follows with Dr. Lorrene Reid. Creatinine fluctuated after heart cath and diuretics. Diuretics  were held for a short interval, then resumed 07/11/15. Creatinine appears stable at this time.  Acute on chronic anemia Drop in H&H of almost 2 g on 5/4. Patient tells me that she received a total of three units of PRBCs. Hemoglobin stable now.  Multifocal airspace disease on chest x-ray Possibly due to pulmonary edema. No signs of pneumonia. Oxygenation is stable. Incentive spirometer. Diuretics have been resumed.  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 Counseled to continue long acting insulin at 50-55 units qHS for now to avoid hypoglycemia.  Right knee joint pain CPPD changes on xray of the knee. Ortho consult greatly appreciated. S/P right knee aspiration and steroid injections.  She can follow-up PRN.  Constipation Bowel regimen administered prior to discharge.  Procedures: 2-D echo Cardiac cath Right upper extremity Doppler Carotid Doppler Right arm angiogram Right subclavian thromboembolectomy  Consultations: Cardiology Vascular surgery Nephrology Orthopedic Surgery  Discharge Exam: Filed Vitals:   07/12/15 1044 07/12/15 1445  BP: 143/57 158/63  Pulse: 71 66  Temp:  98.2 F (36.8 C)  Resp:  18    General: In good spirits, anxious to go home, alert and oriented Neck: Left IJ central venous catheter removed.  Dressing discarded.  No signs of hematoma. Cardiovascular: NR/RR Respiratory: No wheeze or ronchi Extremities: Bilateral ankle, pretibial edema  Discharge Instructions   Discharge Instructions    Amb Referral to Cardiac Rehabilitation    Complete by:  As directed   To [place]  Diagnosis:   NSTEMI Coronary Stents       Diet - low sodium heart healthy    Complete by:  As directed      Discharge instructions    Complete by:  As directed   Review MAR for medication changes. Check your weight daily.  Call cardiologist for weight gain of more than 2-3 lbs in 24 hours. Low salt diet.  2L fluid restriction. Present for emergent  evaluation for chest pain, shortness of breath, light-headedness, dizziness, palpitations, swelling, or signs of active bleeding.     Increase activity slowly    Complete by:  As directed           Current Discharge Medication List    START taking these medications   Details  amLODipine (NORVASC) 5 MG tablet Take 1 tablet (5 mg total) by mouth daily. Qty: 30 tablet, Refills: 0    hydrALAZINE (APRESOLINE) 25 MG tablet Take 1.5 tablets (37.5 mg total) by mouth every 8 (eight) hours. Qty: 90 tablet, Refills: 0    isosorbide mononitrate (IMDUR) 60 MG 24 hr tablet Take 1 tablet (60 mg total) by mouth daily. Qty: 30 tablet, Refills: 0    nitroGLYCERIN (NITROSTAT) 0.4 MG SL tablet Place 1 tablet (0.4 mg total) under the tongue every 5 (five) minutes as needed for chest pain. Qty: 30 tablet, Refills: 0    ticagrelor (BRILINTA) 90 MG TABS tablet Take 1 tablet (90 mg total) by mouth 2 (two) times daily. Qty: 60 tablet, Refills: 0      CONTINUE these medications which have CHANGED   Details  acetaminophen (TYLENOL) 500 MG tablet Take 2 tablets (1,000 mg total) by mouth every 6 (six) hours as needed (pain). Qty: 30 tablet, Refills: 0    Insulin Degludec (TRESIBA FLEXTOUCH) 100 UNIT/ML SOPN Inject 50 Units into the skin at bedtime.    metoprolol (LOPRESSOR) 50 MG tablet Take 1  tablet (50 mg total) by mouth 2 (two) times daily. Qty: 60 tablet, Refills: 0    rosuvastatin (CRESTOR) 40 MG tablet Take 1 tablet (40 mg total) by mouth at bedtime. Qty: 30 tablet, Refills: 0      CONTINUE these medications which have NOT CHANGED   Details  ALPRAZolam (XANAX) 0.5 MG tablet Take 0.5 mg by mouth at bedtime as needed for sleep.     aspirin EC 81 MG tablet Take 81 mg by mouth daily.    benzonatate (TESSALON) 100 MG capsule Take 100 mg by mouth daily as needed for cough.    calcitRIOL (ROCALTROL) 0.25 MCG capsule Take 0.25 mcg by mouth every Monday, Wednesday, and Friday.     famotidine  (PEPCID) 20 MG tablet Take 20 mg by mouth daily.    ferrous sulfate 325 (65 FE) MG tablet Take 325 mg by mouth daily.    gabapentin (NEURONTIN) 100 MG capsule Take 100 mg by mouth daily as needed (pain).     insulin lispro (HUMALOG KWIKPEN) 100 UNIT/ML KiwkPen Inject 0.15-0.21 mLs (15-21 Units total) into the skin 3 (three) times daily. Qty: 15 mL, Refills: 2    latanoprost (XALATAN) 0.005 % ophthalmic solution Place 1 drop into both eyes at bedtime.     meclizine (ANTIVERT) 12.5 MG tablet Take 12.5 mg by mouth 2 (two) times daily.     Multiple Vitamin (MULTIVITAMIN WITH MINERALS) TABS tablet Take 1 tablet by mouth daily.    torsemide (DEMADEX) 20 MG tablet Take 20 mg by mouth 2 (two) times daily.     trimethoprim (TRIMPEX) 100 MG tablet Take 100 mg by mouth daily.     glucose blood (ACCU-CHEK AVIVA) test strip Use to check blood glucose 4 times a day. E11.65 Qty: 150 each, Refills: 5      STOP taking these medications     clopidogrel (PLAVIX) 75 MG tablet      Flax Oil-Fish Oil-Borage Oil (FISH OIL-FLAX OIL-BORAGE OIL) CAPS      Olmesartan-Amlodipine-HCTZ (TRIBENZOR) 40-10-25 MG TABS        Allergies  Allergen Reactions  . Ace Inhibitors Cough    Pt can tolerate Tribenzor (and ARBs)  . Codeine Rash   Follow-up Information    Follow up with Annamarie Major, MD In 2 weeks.   Specialties:  Vascular Surgery, Cardiology   Why:  Our office will call you to arrange an appointment (sent)   Contact information:   [place] [address] [phone]       Follow up with Maggie Font, MD. Call in 1 day.   Specialty:  Family Medicine   Why:  Call TOMORROW to arrange follow-up with your PCP.  You need a BMP by Friday.   Contact information:   [address] [place] [address] [phone]       Follow up with Kate Sable A, MD In 1 day.   Specialty:  Cardiology   Why:  Call TOMORROW to arrange follow-up with [facility].   Contact  information:   [place] [address] [phone]        The results of significant diagnostics from this hospitalization (including imaging, microbiology, ancillary and laboratory) are listed below for reference.    Significant Diagnostic Studies: Dg Chest 2 View  07/11/2015  CLINICAL DATA:  Complication due to venous access device EXAM: CHEST  2 VIEW COMPARISON:  Portable exam 2139 hours compared to 07/10/2015  FINDINGS: Tip of LEFT jugular line again projects over the aortic arch, tip in uncertain position, unable to exclude intra-arterial position of catheter. Enlargement of cardiac silhouette with pulmonary vascular congestion. Improved pulmonary edema. Minimal peribronchial thickening. No pleural effusion or pneumothorax. IMPRESSION: Improved pulmonary edema. Persistent abnormal position of LEFT jugular line with tip projecting over the lateral aspect of the aortic arch, unable to exclude intra-arterial catheter position. Findings called to patient's nurse Duke Salvia RN on [facility] on 07/11/2015 at 2225 hours. Electronically Signed   By: Lavonia Dana M.D.   On: 07/11/2015 22:30   Dg Chest Port 1 View  07/10/2015  CLINICAL DATA:  Central line placement EXAM: PORTABLE CHEST 1 VIEW COMPARISON:  07/05/2015 FINDINGS: Left central line is in place. This courses within the left mediastinal shadow and projects over the aortic arch. Exact position tip cannot be confirmed to be venous. Cannot exclude arterial placement. Recommend clinical correlation. No pneumothorax. Bilateral airspace opacities have increased since prior study. Heart is borderline in size. No effusions or acute bony abnormality. IMPRESSION: Left central line tip projects over the the aortic arch in the left superior mediastinum. Cannot confirm venous placement based on imaging. Cannot exclude arterial placement. Recommend clinical correlation. Increasing bilateral airspace disease which could represent edema or infection. Favor  edema. Electronically Signed   By: Rolm Baptise M.D.   On: 07/10/2015 11:23   Dg Chest Port 1 View  07/05/2015  CLINICAL DATA:  70 year old female with history of shortness of breath. History of congestive heart failure. EXAM: PORTABLE CHEST 1 VIEW COMPARISON:  Chest x-ray 07/04/2015. FINDINGS: Aeration has improved significantly compared to the prior study, although there continues to be patchy asymmetrically distributed airspace disease predominantly in the left lung base and throughout the right mid to lower lung. Small bilateral pleural effusions. Minimal cephalization of the pulmonary vasculature. Heart size is normal. Upper mediastinal contours are within normal limits. IMPRESSION: 1. Resolving multifocal airspace disease. This was relatively symmetric in appearance on the prior study, which may indicate resolving pulmonary edema, however, the appearance is far more asymmetric on today's study, and heart size is normal. Clinical correlation for multilobar bronchopneumonia is suggested. 2. Small bilateral pleural effusions. Electronically Signed   By: Vinnie Langton M.D.   On: 07/05/2015 10:44   Dg Knee 2 Views Right  07/11/2015  CLINICAL DATA:  Anterior knee pain for 1 week.  No injury. EXAM: RIGHT KNEE - 3 VIEW COMPARISON:  None. FINDINGS: Chondrocalcinosis seen bilaterally consistent with CPPD. Vascular calcifications are noted. No fractures or dislocations. Mild degenerative changes, particular in the patellofemoral compartment. No joint effusions. IMPRESSION: CPPD with chondrocalcinosis.  Mild degenerative changes. Electronically Signed   By: Dorise Bullion III M.D   On: 07/11/2015 12:49    Microbiology: Recent Results (from the past 240 hour(s))  MRSA PCR Screening     Status: None   Collection Time: 07/04/15  9:50 AM  Result Value Ref Range Status   MRSA by PCR NEGATIVE NEGATIVE Final    Comment:        The GeneXpert MRSA Assay (FDA approved for NASAL specimens only), is one component  of a comprehensive MRSA colonization surveillance program. It is not intended to diagnose MRSA infection nor to guide or monitor treatment for MRSA infections.      Labs: Basic Metabolic Panel:  Recent Labs Lab 07/06/15 0256  07/08/15 0646 07/09/15 0242 07/09/15 1145 07/10/15 0225 07/10/15 0300 07/11/15 0221 07/12/15 0335  NA 138  < > 133*  133* 135  134*  --  137 139 138 137  136  K 3.8  < > 4.0  4.0 3.9  4.0  --  4.6 4.5 4.4 4.3  4.3  CL 98*  < > 93*  94* 96*  95*  --  102 101 103 104  104  CO2 27  < > 28  28 28  27   --  23 23 23 23  22   GLUCOSE 143*  < > 254*  259* 257*  254*  --  156* 155* 88 156*  158*  BUN 87*  < > 91*  92* 86*  87*  --  66* 65* 53* 49*  52*  CREATININE 2.73*  < > 2.57*  2.62* 2.39*  2.43* 2.26* 1.87* 1.88* 1.78* 1.85*  1.76*  CALCIUM 9.1  < > 8.8*  8.8* 8.8*  8.6*  --  9.0 9.1 8.8* 8.8*  8.7*  MG 2.5*  --  2.9*  --   --   --   --   --   --   PHOS 3.5  < > 3.8 3.8  --   --  3.2 3.4 2.8  < > = values in this interval not displayed. Liver Function Tests:  Recent Labs Lab 07/08/15 0646 07/09/15 0242 07/10/15 0300 07/11/15 0221 07/12/15 0335  AST 46*  --   --   --   --   ALT 29  --   --   --   --   ALKPHOS 56  --   --   --   --   BILITOT 0.7  --   --   --   --   PROT 6.8  --   --   --   --   ALBUMIN 3.1*  3.1* 2.9* 3.0* 2.8* 2.9*   CBC:  Recent Labs Lab 07/06/15 0822  07/08/15 0646 07/09/15 0242 07/09/15 1145 07/10/15 0225 07/11/15 0221 07/12/15 0335  WBC 7.4  < > 5.6 6.0 6.6 5.3 5.4 6.3  NEUTROABS 5.3  --  4.1  --   --   --   --   --   HGB 9.8*  < > 8.7* 6.6* 8.7* 9.9* 9.3* 9.2*  HCT 31.3*  < > 27.5* 20.9* 28.4* 31.9* 29.7* 30.3*  MCV 101.6*  < > 100.7* 102.5* 99.6 97.6 99.0 98.4  PLT 169  < > 153 160 160 171 147* 175  < > = values in this interval not displayed. BNP: BNP (last 3 results)  Recent Labs  07/04/15 1456  BNP 663.6*   CBG:  Recent Labs Lab 07/11/15 1629 07/11/15 2036  07/12/15 0608 07/12/15 1116 07/12/15 1638  GLUCAP 220* 191* 80 113* 182*       Signed:  Eber Jones MD.  [facility] 07/12/2015, 5:02 PM

## 2015-07-12 NOTE — Progress Notes (Signed)
    Subjective  - POD #2  Right arm feels better this   Physical Exam:  Palpable pulse to right radial artery Edema of right hand Good strength of right hand      Assessment/Plan:  POD #23  Keep arm elevated to help with edema Continue dual anti-platelet therapy OT consulted for right hand issues, may benifit from outpatient PT/OT OK to d/c home from vascular perspective F/u 2 weeks   Lajean Boese, [place] 07/12/2015 7:49 AM --  Filed Vitals:   07/11/15 2040 07/12/15 0423  BP: 150/59 142/62  Pulse: 73 75  Temp: 99.1 F (37.3 C) 98.4 F (36.9 C)  Resp: 18 18   No intake or output data in the 24 hours ending 07/12/15 0749   Laboratory CBC    Component Value Date/Time   WBC 6.3 07/12/2015 0335   HGB 9.2* 07/12/2015 0335   HCT 30.3* 07/12/2015 0335   PLT 175 07/12/2015 0335    BMET    Component Value Date/Time   NA 137 07/12/2015 0335   NA 136 07/12/2015 0335   NA 144 06/23/2015 1004   K 4.3 07/12/2015 0335   K 4.3 07/12/2015 0335   CL 104 07/12/2015 0335   CL 104 07/12/2015 0335   CO2 23 07/12/2015 0335   CO2 22 07/12/2015 0335   GLUCOSE 156* 07/12/2015 0335   GLUCOSE 158* 07/12/2015 0335   GLUCOSE 39* 06/23/2015 1004   BUN 49* 07/12/2015 0335   BUN 52* 07/12/2015 0335   BUN 67* 06/23/2015 1004   CREATININE 1.85* 07/12/2015 0335   CREATININE 1.76* 07/12/2015 0335   CREATININE 1.73* 10/08/2013 1024   CALCIUM 8.8* 07/12/2015 0335   CALCIUM 8.7* 07/12/2015 0335   GFRNONAA 26* 07/12/2015 0335   GFRNONAA 28* 07/12/2015 0335   GFRAA 31* 07/12/2015 0335   GFRAA 33* 07/12/2015 0335    COAG Lab Results  Component Value Date   INR 1.10 07/10/2015   INR 1.11 07/08/2015   INR 1.16 07/04/2015   No results found for: PTT  Antibiotics Anti-infectives    Start     Dose/Rate Route Frequency Ordered Stop   07/10/15 0845  cefUROXime (ZINACEF) 1.5 g in dextrose 5 % 50 mL IVPB  Status:  Discontinued     1.5 g 100 mL/hr over 30 Minutes Intravenous To  Surgery 07/10/15 0844 07/10/15 1228   07/10/15 0600  cefUROXime (ZINACEF) 1.5 g in dextrose 5 % 50 mL IVPB     1.5 g 100 mL/hr over 30 Minutes Intravenous To ShortStay Surgical 07/09/15 2021 07/10/15 0849       V. Leia Alf, M.D. [facility] Office: [phone] Pager:  [phone]

## 2015-07-12 NOTE — Care Management Note (Addendum)
Case Management Note  Patient Details  Name: [name] MRN: [id number] Date of Birth: 10-08-1945  Subjective/Objective:                  numbness and weakness in right hand Action/Plan: Discharge  Expected Discharge Date:  07/12/15               Expected Discharge Plan:  Home/Self Care  In-House Referral:     Discharge planning Services  CM Consult, Medication Assistance  Post Acute Care Choice:    Choice offered to:     DME Arranged:    DME Agency:     HH Arranged:    [place] Agency:     Status of Service:     Medicare Important Message Given:  Yes Date Medicare IM Given:    Medicare IM give by:    Date Additional Medicare IM Given:    Additional Medicare Important Message give by:     If discussed at [place] of Stay Meetings, dates discussed:    Additional Comments: CM received call from MD to arrange outpt PT/OT.  Cm notes pt lives in [place] and MD agreed to give pt prescription to arrange for PT/OT in her home state.  No other CM needs were communicated. Dellie Catholic, RN 07/12/2015, 9:41 AM

## 2015-07-12 NOTE — Progress Notes (Signed)
PROGRESS NOTE                                                                                                                                                                                                             Patient Demographics:    [name], is a 70 y.o. female, DOB - 07-21-1945, [id number]  Admit date - 07/04/2015   Admitting Physician Minus Breeding, MD  Outpatient Primary MD for the patient is Maggie Font, MD  LOS - 8  Outpatient Specialists: Cardiology Dr Bronson Ing Gastroenterology Dr. Oneida Alar Nephrology: Dr. Lorrene Reid Vascular surgery: Dr. Annamarie Major  Chief complaint: Shortness of breath  Brief Narrative   70 y.o. female with a history of CAD S/P DES to circumflex lesion in 2008 (multivessel disease noted at that time involving the LAD, 1st diagonal; historicaly, she has had a preserved EF), IDDM, HTN, dyslipidemia, and GERD presented at [facility] ED with progressive shortness of breath and worsening cough. She was found to have NSTEMI and transferred to [facility]. She was also found to have flash pulmonary edema requiring BiPAP. Patient underwent cardiac catheterization with mid RCA and posterior PDA stenting. Hospital course prolonged due to worsened renal function and  finding of subclavian steal syndrome. S/P right subclavian artery thromboembolectomy on 07/10/2015   Subjective:  Right hand weakness improving.  Complaining of constipation.  She has had only one bowel movement since admission.    Assessment  & Plan :    Principal Problem:    NSTEMI (non-ST elevated myocardial infarction) ([place]) -Status post left heart catheterization, Mid RCA and posterior PDA stenting. Peak troponin 24 -dual antiplatelet. ( aspirin and Brilinta) May require viability study in the [place] territory with a planned staged intervention for chronic total occlusion. Cardiology plan on medical management for now  given her underlying renal function. Continue Crestor , Lopressor,  and Imdur.   Active Problems: Distal right subclavian artery occlusion Found incidentally prior to cath as patient has decreased radial pulsation. Patient reports numbness in her right hand for several weeks and now has weakness with some wristdrop in her right hand. Appeared to be chronic total occlusion . Arteriogram done with her right arm angiogram which shows patent proximal right subclavian artery with distal right subclavian artery occlusion. S/P right subclavian artery thromboembolectomy on 07/10/2015. Tolerated procedure well. Has improved right radial  pulse and decreased numbness postoperatively, but RUE function is still not back to baseline.    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 Diuresed with IV Lasix but given worsened renal function it was discontinued. She is currently euvolemic. Continue aspirin, beta blocker, hydralazine, Imdur and statin. Not on ACEi/ ARB due to renal disease. Chest xray 5/5 still shows bilateral infiltrates, favor edema.  She is back on oral diuretics now.  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 Follows with Dr. Lorrene Reid. Creatinine fluctuated after heart cath and diuretics.  Diuretics were held for a short interval, then resumed 07/11/15.  Creatinine appears stable at this time.  Acute on chronic anemia Drop in H&H of almost 2 g on 5/4. Patient tells me that she received a total of three units of PRBCs.  Hemoglobin stable.  Multifocal airspace disease on chest x-ray Possibly due to pulmonary edema. No signs of pneumonia.  Oxygenation is stable. Incentive spirometer.  Diuretics have been resumed.  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 Reduced long acting insulin dose last night and decreased mealtime insulin  dosing yesterday.  No more episodes of hypoglycemia.  AM blood sugar 80.  Right knee joint pain CPPD changes on xray of the knee.  Ortho consult greatly appreciated.  Anticipate knee injection this afternoon then possible discharge to home.  Constipation Bowel regimen; hope to see bowel movement prior to discharge.  Code Status : Full code  Family Communication  : Daughter at bedside today.  Disposition Plan  : Anticipate discharge later today with outpatient PT/OT.  Barriers For Discharge :  Stable labs, appropriate outpatient referrals in place  Consults  :   Cardiology Vascular surgery Nephrology  Procedures  :  2-D echo Cardiac cath Right upper extremity Doppler Carotid Doppler Right arm angiogram Right subclavian thromboembolectomy  DVT Prophylaxis  : Subcutaneous heparin   Lab Results  Component Value Date   PLT 175 07/12/2015    Antibiotics  :  NONE  Anti-infectives    Start     Dose/Rate Route Frequency Ordered Stop   07/10/15 0845  cefUROXime (ZINACEF) 1.5 g in dextrose 5 % 50 mL IVPB  Status:  Discontinued     1.5 g 100 mL/hr over 30 Minutes Intravenous To Surgery 07/10/15 0844 07/10/15 1228   07/10/15 0600  cefUROXime (ZINACEF) 1.5 g in dextrose 5 % 50 mL IVPB     1.5 g 100 mL/hr over 30 Minutes Intravenous To ShortStay Surgical 07/09/15 2021 07/10/15 0849        Objective:   Filed Vitals:   07/11/15 1312 07/11/15 1645 07/11/15 2040 07/12/15 0423  BP: 124/51 141/61 150/59 142/62  Pulse: 58  73 75  Temp: 98.6 F (37 C)  99.1 F (37.3 C) 98.4 F (36.9 C)  TempSrc: Oral  Oral Oral  Resp: 18  18 18   Height:      Weight:      SpO2: 100%  98% 95%    Wt Readings from Last 3 Encounters:  07/11/15 104.055 kg (229 lb 6.4 oz)  06/30/15 102.059 kg (225 lb)  03/30/15 102.059 kg (225 lb)    No intake or output data in the 24 hours ending 07/12/15 0918   Physical Exam  Gen: awake and alert, NAD, left IJ central line still in place HEENT:  Mucous membranes slightly dry Pulm: CTA  listening anteriorly CVS: Normal rate, regular rhythm, no murmur.  Trace to 1+ pretibial edema in both legs/ankles. GI: soft, NT, ND, BS+ Musculoskeletal: small right knee effusion, no significant tenderness.  Right hand is also swollen. CNS: right hand weakness with decreased grip    Data Review:    CBC  Recent Labs Lab 07/06/15 0822  07/08/15 0646 07/09/15 0242 07/09/15 1145 07/10/15 0225 07/11/15 0221 07/12/15 0335  WBC 7.4  < > 5.6 6.0 6.6 5.3 5.4 6.3  HGB 9.8*  < > 8.7* 6.6* 8.7* 9.9* 9.3* 9.2*  HCT 31.3*  < > 27.5* 20.9* 28.4* 31.9* 29.7* 30.3*  PLT 169  < > 153 160 160 171 147* 175  MCV 101.6*  < > 100.7* 102.5* 99.6 97.6 99.0 98.4  MCH 31.8  < > 31.9 32.4 30.5 30.3 31.0 29.9  MCHC 31.3  < > 31.6 31.6 30.6 31.0 31.3 30.4  RDW 14.5  < > 14.3 14.4 14.4 16.7* 16.4* 15.9*  LYMPHSABS 1.2  --  0.9  --   --   --   --   --   MONOABS 0.7  --  0.4  --   --   --   --   --   EOSABS 0.1  --  0.2  --   --   --   --   --   BASOSABS 0.0  --  0.0  --   --   --   --   --   < > = values in this interval not displayed.  Chemistries   Recent Labs Lab 07/06/15 0256  07/08/15 0646 07/09/15 0242 07/09/15 1145 07/10/15 0225 07/10/15 0300 07/11/15 0221 07/12/15 0335  NA 138  < > 133*  133* 135  134*  --  137 139 138 137  136  K 3.8  < > 4.0  4.0 3.9  4.0  --  4.6 4.5 4.4 4.3  4.3  CL 98*  < > 93*  94* 96*  95*  --  102 101 103 104  104  CO2 27  < > 28  28 28  27   --  23 23 23 23  22   GLUCOSE 143*  < > 254*  259* 257*  254*  --  156* 155* 88 156*  158*  BUN 87*  < > 91*  92* 86*  87*  --  66* 65* 53* 49*  52*  CREATININE 2.73*  < > 2.57*  2.62* 2.39*  2.43* 2.26* 1.87* 1.88* 1.78* 1.85*  1.76*  CALCIUM 9.1  < > 8.8*  8.8* 8.8*  8.6*  --  9.0 9.1 8.8* 8.8*  8.7*  MG 2.5*  --  2.9*  --   --   --   --   --   --   AST  --   --  46*  --   --   --   --   --   --   ALT  --   --  29  --   --   --   --   --   --   ALKPHOS   --   --  56  --   --   --   --   --   --   BILITOT  --   --  0.7  --   --   --   --   --   --   < > = values in this interval not displayed. ------------------------------------------------------------------------------------------------------------------  Lab Results  Component Value Date   HGBA1C 9.4* 07/04/2015   Coagulation profile  Recent Labs Lab 07/08/15 0646 07/10/15 0225  INR 1.11 1.10  ------------------------------------------------------------------------------------------------------------------    Component Value Date/Time   BNP 663.6* 07/04/2015 1456    Inpatient Medications  Scheduled Meds: . amLODipine  5 mg Oral Daily  . aspirin EC  81 mg Oral Daily  . bisacodyl  10 mg Rectal Once  . calcitRIOL  0.25 mcg Oral Once per day on Mon Wed Fri  . docusate sodium  100 mg Oral BID  . famotidine  20 mg Oral Daily  . ferrous sulfate  325 mg Oral Daily  . heparin  5,000 Units Subcutaneous Q8H  . hydrALAZINE  37.5 mg Oral Q8H  . insulin aspart  0-20 Units Subcutaneous TID WC  . insulin glargine  55 Units Subcutaneous QHS  . isosorbide mononitrate  60 mg Oral Daily  . latanoprost  1 drop Both Eyes QHS  . metoprolol tartrate  50 mg Oral BID  . polyethylene glycol  17 g Oral Once  . rosuvastatin  40 mg Oral QHS  . sodium chloride flush  3 mL Intravenous Q12H  . ticagrelor  90 mg Oral BID  . torsemide  20 mg Oral BID   Continuous Infusions:   PRN Meds:.sodium chloride, acetaminophen, ALPRAZolam, benzonatate, diazepam, nitroGLYCERIN, ondansetron (ZOFRAN) IV, sodium chloride flush, traMADol, zolpidem  Micro Results Recent Results (from the past 240 hour(s))  MRSA PCR Screening     Status: None   Collection Time: 07/04/15  9:50 AM  Result Value Ref Range Status   MRSA by PCR NEGATIVE NEGATIVE Final    Comment:        The GeneXpert MRSA Assay (FDA approved for NASAL specimens only), is one component of a comprehensive MRSA colonization surveillance program. It  is not intended to diagnose MRSA infection nor to guide or monitor treatment for MRSA infections.     Radiology Reports Dg Chest 2 View  07/11/2015  CLINICAL DATA:  Complication due to venous access device EXAM: CHEST  2 VIEW COMPARISON:  Portable exam 2139 hours compared to 07/10/2015 FINDINGS: Tip of LEFT jugular line again projects over the aortic arch, tip in uncertain position, unable to exclude intra-arterial position of catheter. Enlargement of cardiac silhouette with pulmonary vascular congestion. Improved pulmonary edema. Minimal peribronchial thickening. No pleural effusion or pneumothorax. IMPRESSION: Improved pulmonary edema. Persistent abnormal position of LEFT jugular line with tip projecting over the lateral aspect of the aortic arch, unable to exclude intra-arterial catheter position. Findings called to patient's nurse Duke Salvia RN on [facility] on 07/11/2015 at 2225 hours. Electronically Signed   By: Lavonia Dana M.D.   On: 07/11/2015 22:30   Dg Chest Port 1 View  07/10/2015  CLINICAL DATA:  Central line placement EXAM: PORTABLE CHEST 1 VIEW COMPARISON:  07/05/2015 FINDINGS: Left central line is in place. This courses within the left mediastinal shadow and projects over the aortic arch. Exact position tip cannot be confirmed to be venous. Cannot exclude arterial placement. Recommend clinical correlation. No pneumothorax. Bilateral airspace opacities have increased since prior study. Heart is borderline in size. No effusions or acute bony abnormality. IMPRESSION: Left central line tip projects over the the aortic arch in the left superior mediastinum. Cannot confirm venous placement based on imaging. Cannot exclude arterial placement. Recommend clinical correlation. Increasing bilateral airspace disease which could represent edema or infection. Favor edema. Electronically Signed   By: Rolm Baptise M.D.   On:  07/10/2015 11:23   Dg Chest Port 1 View  07/05/2015  CLINICAL DATA:  70 year old female  with history of shortness of breath. History of congestive heart failure. EXAM: PORTABLE CHEST 1 VIEW COMPARISON:  Chest x-ray 07/04/2015. FINDINGS: Aeration has improved significantly compared to the prior study, although there continues to be patchy asymmetrically distributed airspace disease predominantly in the left lung base and throughout the right mid to lower lung. Small bilateral pleural effusions. Minimal cephalization of the pulmonary vasculature. Heart size is normal. Upper mediastinal contours are within normal limits. IMPRESSION: 1. Resolving multifocal airspace disease. This was relatively symmetric in appearance on the prior study, which may indicate resolving pulmonary edema, however, the appearance is far more asymmetric on today's study, and heart size is normal. Clinical correlation for multilobar bronchopneumonia is suggested. 2. Small bilateral pleural effusions. Electronically Signed   By: Vinnie Langton M.D.   On: 07/05/2015 10:44   Dg Knee 2 Views Right  07/11/2015  CLINICAL DATA:  Anterior knee pain for 1 week.  No injury. EXAM: RIGHT KNEE - 3 VIEW COMPARISON:  None. FINDINGS: Chondrocalcinosis seen bilaterally consistent with CPPD. Vascular calcifications are noted. No fractures or dislocations. Mild degenerative changes, particular in the patellofemoral compartment. No joint effusions. IMPRESSION: CPPD with chondrocalcinosis.  Mild degenerative changes. Electronically Signed   By: Dorise Bullion III M.D   On: 07/11/2015 12:49    Time Spent in minutes:  30 minutes   Eber Jones M.D on 07/12/2015 at 9:17 AM   [facility] -  Office  [phone]

## 2015-07-12 NOTE — Progress Notes (Signed)
On Call doctor, call back and advise to remove CVC, hold off for a peripheral access for now and wait for the a.m to place another central line.

## 2015-07-12 NOTE — Discharge Instructions (Signed)
Acute Coronary Syndrome °Acute coronary syndrome (ACS) is a serious problem in which there is suddenly not enough blood and oxygen supplied to the heart. ACS may mean that one or more of the blood vessels in your heart (coronary arteries) may be blocked. ACS can result in chest pain or a heart attack (myocardial infarction or MI). °CAUSES °This condition is caused by atherosclerosis, which is the buildup of fat and cholesterol (plaque) on the inside of the arteries. Over time, the plaque may narrow or block the artery, and this will lessen blood flow to the heart. Plaque can also become weak and break off within a coronary artery to form a clot and cause a sudden blockage. °RISK FACTORS °The risks factors of this condition include: °· High cholesterol levels. °· High blood pressure (hypertension). °· Smoking. °· Diabetes. °· Age. °· Family history of chest pain, heart disease, or stroke. °· Lack of exercise. °SYMPTOMS °The most common signs of this condition include: °· Chest pain, which can be: °¨ A crushing or squeezing in the chest. °¨ A tightness, pressure, fullness, or heaviness in the chest. °¨ Present for more than a few minutes, or it can stop and recur. °· Pain in the arms, neck, jaw, or back. °· Unexplained heartburn or indigestion. °· Shortness of breath. °· Nausea. °· Sudden cold sweats. °· Feeling light-headed or dizzy. °Sometimes, this condition has no symptoms. °DIAGNOSIS °ACS may be diagnosed through the following tests: °· Electrocardiogram (ECG). °· Blood tests. °· Coronary angiogram. This is a procedure to look at the coronary arteries to see if there is any blockage. °TREATMENT °Treatment for ACS may include: °· Healthy behavioral changes to reduce or control risk factors. °· Medicine. °· Coronary stenting. A stent helps to keep an artery open. °· Coronary angioplasty. This procedure widens a narrowed or blocked artery. °· Coronary artery bypass surgery. This will allow your blood to pass the  blockage (bypass) to reach your heart. °HOME CARE INSTRUCTIONS °Eating and Drinking °· Follow a heart-healthy diet. A dietitian can you help to educate you about healthy food options and changes. °· Use healthy cooking methods such as roasting, grilling, broiling, baking, poaching, steaming, or stir-frying. Talk to a dietitian to learn more about healthy cooking methods. °Medicines °· Take medicines only as directed by your health care provider. °· Do not take the following medicines unless your health care provider approves: °¨ Nonsteroidal anti-inflammatory drugs (NSAIDs), such as ibuprofen, naproxen, or celecoxib. °¨ Vitamin supplements that contain vitamin A, vitamin E, or both. °¨ Hormone replacement therapy that contains estrogen with or without progestin. °· Stop illegal drug use. °Activities °· Follow an exercise program that is approved by your health care provider. °· Plan rest periods when you are fatigued. °Lifestyle °· Do not use any tobacco products, including cigarettes, chewing tobacco, or electronic cigarettes. If you need help quitting, ask your health care provider. °· If you drink alcohol, and your health care provider approves, limit your alcohol intake to no more than 1 drink per day. One drink equals 12 ounces of beer, 5 ounces of wine, or 1½ ounces of hard liquor. °· Learn to manage stress. °· Maintain a healthy weight. Lose weight as approved by your health care provider. °General Instructions °· Manage other health conditions, such as hypertension and diabetes, as directed by your health care provider. °· Keep all follow-up visits as directed by your health care provider. This is important. °· Your health care provider may ask you to monitor your blood   pressure. A blood pressure reading consists of a higher number over a lower number, such as 110 over 72, written as 110/72. Ideally, your blood pressure should be:  Below 140/90 if you have no other medical conditions.  Below 130/80 if  you have diabetes or kidney disease. SEEK IMMEDIATE MEDICAL CARE IF:  You have pain in your chest, neck, arm, jaw, stomach, or back that lasts more than a few minutes, is recurring, or is not relieved by taking medicine under your tongue (sublingual nitroglycerin).  You have profuse sweating without cause.  You have unexplained:  Heartburn or indigestion.  Shortness of breath or difficulty breathing.  Nausea or vomiting.  Fatigue.  Feelings of nervousness or anxiety.  Weakness.  Diarrhea.  You have sudden light-headedness or dizziness.  You faint. These symptoms may represent a serious problem that is an emergency. Do not wait to see if the symptoms will go away. Get medical help right away. Call your local emergency services (911 in the [place] Do not drive yourself to the clinic or hospital.   This information is not intended to replace advice given to you by your health care provider. Make sure you discuss any questions you have with your health care provider.   Document Released: 02/21/2005 Document Revised: 03/14/2014 Document Reviewed: 06/25/2013 Elsevier Interactive Patient Education 2016 [employer]. Diabetes Mellitus and Food It is important for you to manage your blood sugar (glucose) level. Your blood glucose level can be greatly affected by what you eat. Eating healthier foods in the appropriate amounts throughout the day at about the same time each day will help you control your blood glucose level. It can also help slow or prevent worsening of your diabetes mellitus. Healthy eating may even help you improve the level of your blood pressure and reach or maintain a healthy weight.  General recommendations for healthful eating and cooking habits include: Eating meals and snacks regularly. Avoid going long periods of time without eating to lose weight. Eating a diet that consists mainly of plant-based foods, such as fruits, vegetables, nuts, legumes, and whole  grains. Using low-heat cooking methods, such as baking, instead of high-heat cooking methods, such as deep frying. Work with your dietitian to make sure you understand how to use the Nutrition Facts information on food labels. HOW CAN FOOD AFFECT ME? Carbohydrates Carbohydrates affect your blood glucose level more than any other type of food. Your dietitian will help you determine how many carbohydrates to eat at each meal and teach you how to count carbohydrates. Counting carbohydrates is important to keep your blood glucose at a healthy level, especially if you are using insulin or taking certain medicines for diabetes mellitus. Alcohol Alcohol can cause sudden decreases in blood glucose (hypoglycemia), especially if you use insulin or take certain medicines for diabetes mellitus. Hypoglycemia can be a life-threatening condition. Symptoms of hypoglycemia (sleepiness, dizziness, and disorientation) are similar to symptoms of having too much alcohol.  If your health care provider has given you approval to drink alcohol, do so in moderation and use the following guidelines: Women should not have more than one drink per day, and men should not have more than two drinks per day. One drink is equal to: 12 oz of beer. 5 oz of wine. 1 oz of hard liquor. Do not drink on an empty stomach. Keep yourself hydrated. Have water, diet soda, or unsweetened iced tea. Regular soda, juice, and other mixers might contain a lot of carbohydrates and should be counted.  WHAT FOODS ARE NOT RECOMMENDED? As you make food choices, it is important to remember that all foods are not the same. Some foods have fewer nutrients per serving than other foods, even though they might have the same number of calories or carbohydrates. It is difficult to get your body what it needs when you eat foods with fewer nutrients. Examples of foods that you should avoid that are high in calories and carbohydrates but low in nutrients  include: Trans fats (most processed foods list trans fats on the Nutrition Facts label). Regular soda. Juice. Candy. Sweets, such as cake, pie, doughnuts, and cookies. Fried foods. WHAT FOODS CAN I EAT? Eat nutrient-rich foods, which will nourish your body and keep you healthy. The food you should eat also will depend on several factors, including: The calories you need. The medicines you take. Your weight. Your blood glucose level. Your blood pressure level. Your cholesterol level. You should eat a variety of foods, including: Protein. Lean cuts of meat. Proteins low in saturated fats, such as fish, egg whites, and beans. Avoid processed meats. Fruits and vegetables. Fruits and vegetables that may help control blood glucose levels, such as apples, mangoes, and yams. Dairy products. Choose fat-free or low-fat dairy products, such as milk, yogurt, and cheese. Grains, bread, pasta, and rice. Choose whole grain products, such as multigrain bread, whole oats, and brown rice. These foods may help control blood pressure. Fats. Foods containing healthful fats, such as nuts, avocado, olive oil, canola oil, and fish. DOES EVERYONE WITH DIABETES MELLITUS HAVE THE SAME MEAL PLAN? Because every person with diabetes mellitus is different, there is not one meal plan that works for everyone. It is very important that you meet with a dietitian who will help you create a meal plan that is just right for you.   This information is not intended to replace advice given to you by your health care provider. Make sure you discuss any questions you have with your health care provider.   Document Released: 11/18/2004 Document Revised: 03/14/2014 Document Reviewed: 01/18/2013 Elsevier Interactive Patient Education 2016 [place] Segment Elevation Heart Attack A heart attack (myocardial infarction) happens when some of the heart muscle is injured or dies because it does not get enough oxygen. A  non-ST segment elevation heart attack is a type of heart attack. It happens when the body does not get enough oxygen because an artery carrying blood to the heart muscles (coronary artery) becomes partly or temporarily blocked. This type of heart attack is usually less severe than the type of heart attack in which a coronary artery becomes completely blocked. CAUSES The most common cause of this condition is a blocked coronary artery. A coronary artery can become blocked from a gradual buildup of cholesterol, fat, and plaque. A blood clot can form over the plaque and block blood flow. RISK FACTORS This condition is more likely to develop in:  Smokers.  Males.  Older adults.  Overweight and obese adults.  People with high blood pressure (hypertension), high cholesterol, or diabetes.  People with a family history of heart disease.  People who do not get enough exercise.  People who are under a lot of stress.  People who drink too much alcohol.  People who use illegal street drugs that increase the heart rate, such as cocaine and methamphetamines. SYMPTOMS Symptoms of this condition include:  Chest pain or a feeling of pressure in the chest. It may feel like something is crushing or squeezing the  chest.  Discomfort in the upper back or in the area between the shoulder blades.  Upper back pain.  Tingling in the hands and arms.  Shortness of breath.  Heartburn or indigestion.  Sudden cold sweats.  Unexplained sweating.  Sudden lightheadedness.  Unexplained feelings of nervousness or anxiety.  Feeling of tiredness, or not feeling well. DIAGNOSIS This condition is diagnosed based on a person's signs and symptoms and a physical exam. You may also have tests done, including:  Blood tests.  A chest X-ray.  An test to measure the electrical activity of the heart (electrocardiogram).  A test that uses sound waves to produce a picture of the heart (echocardiogram).  A  test to look at the heart arteries (coronary angiogram). If you are still having chest pain after 12-24 hours, or if your health care providers think your heart is at risk, you may have a procedure called cardiac catheterization. In this procedure, a long, thin tube is inserted into an artery in your groin and moved up to the arteries in your heart. This procedure helps your health care provider figure out the source of the problem.  TREATMENT This condition may be treated with:  Bed rest in the hospital.  Medicines to relieve chest pain.  Medicines to protect the heart.  If you have a blockage, a procedure in which the artery is opened (angioplasty) and a stent is placed to keep the artery open. After initial treatment you may need to take medicine to:  Keep your blood from clotting too easily.  Control your blood pressure.  Lower your cholesterol.  Control abnormal heart rhythms (arrhythmias). HOME CARE INSTRUCTIONS  Take medicines only as directed by your health care provider.  Do not take the following medicines unless your health care provider approves:  Nonsteroidal anti-inflammatory drugs (NSAIDs), such as ibuprofen, naproxen, or celecoxib.  Vitamin supplements that contain vitamin A, vitamin E, or both.  Hormone replacement therapy that contains estrogen with or without progestin.  Make lifestyle changes as directed by your health care provider. These may include:  Using no tobacco products, including cigarettes, chewing tobacco, and electronic cigarettes. If you are struggling to quit, ask your health care provider for help.  Exercising as directed by your health care provider. Ask for a list of activities that are safe for you.  Eating a heart-healthy diet. Work with a Firefighter to learn healthy eating options.  Maintaining a healthy weight.  Managing other medical conditions, like diabetes.  Reducing stress.  Limiting how much alcohol you drink as  directed by your health care provider. SEEK IMMEDIATE MEDICAL CARE IF:  You have any symptoms of this condition.   This information is not intended to replace advice given to you by your health care provider. Make sure you discuss any questions you have with your health care provider.   Document Released: 09/20/2004 Document Revised: 05/16/2011 Document Reviewed: 01/29/2014 Elsevier Interactive Patient Education 2016 [employer] Heart Failure Heart failure is a condition in which the heart has trouble pumping blood. This means your heart does not pump blood efficiently for your body to work well. In some cases of heart failure, fluid may back up into your lungs or you may have swelling (edema) in your lower legs. Heart failure is usually a long-term (chronic) condition. It is important for you to take good care of yourself and follow your health care provider's treatment plan. CAUSES  Some health conditions can cause heart failure. Those health conditions include:  High blood pressure (hypertension). Hypertension causes the heart muscle to work harder than normal. When pressure in the blood vessels is high, the heart needs to pump (contract) with more force in order to circulate blood throughout the body. High blood pressure eventually causes the heart to become stiff and weak.  Coronary artery disease (CAD). CAD is the buildup of cholesterol and fat (plaque) in the arteries of the heart. The blockage in the arteries deprives the heart muscle of oxygen and blood. This can cause chest pain and may lead to a heart attack. High blood pressure can also contribute to CAD.  Heart attack (myocardial infarction). A heart attack occurs when one or more arteries in the heart become blocked. The loss of oxygen damages the muscle tissue of the heart. When this happens, part of the heart muscle dies. The injured tissue does not contract as well and weakens the heart's ability to pump blood.  Abnormal heart  valves. When the heart valves do not open and close properly, it can cause heart failure. This makes the heart muscle pump harder to keep the blood flowing.  Heart muscle disease (cardiomyopathy or myocarditis). Heart muscle disease is damage to the heart muscle from a variety of causes. These can include drug or alcohol abuse, infections, or unknown reasons. These can increase the risk of heart failure.  Lung disease. Lung disease makes the heart work harder because the lungs do not work properly. This can cause a strain on the heart, leading it to fail.  Diabetes. Diabetes increases the risk of heart failure. High blood sugar contributes to high fat (lipid) levels in the blood. Diabetes can also cause slow damage to tiny blood vessels that carry important nutrients to the heart muscle. When the heart does not get enough oxygen and food, it can cause the heart to become weak and stiff. This leads to a heart that does not contract efficiently.  Other conditions can contribute to heart failure. These include abnormal heart rhythms, thyroid problems, and low blood counts (anemia). Certain unhealthy behaviors can increase the risk of heart failure, including:  Being overweight.  Smoking or chewing tobacco.  Eating foods high in fat and cholesterol.  Abusing illicit drugs or alcohol.  Lacking physical activity. SYMPTOMS  Heart failure symptoms may vary and can be hard to detect. Symptoms may include:  Shortness of breath with activity, such as climbing stairs.  Persistent cough.  Swelling of the feet, ankles, legs, or abdomen.  Unexplained weight gain.  Difficulty breathing when lying flat (orthopnea).  Waking from sleep because of the need to sit up and get more air.  Rapid heartbeat.  Fatigue and loss of energy.  Feeling light-headed, dizzy, or close to fainting.  Loss of appetite.  Nausea.  Increased urination during the night (nocturia). DIAGNOSIS  A diagnosis of heart  failure is based on your history, symptoms, physical examination, and diagnostic tests. Diagnostic tests for heart failure may include:  Echocardiography.  Electrocardiography.  Chest X-ray.  Blood tests.  Exercise stress test.  Cardiac angiography.  Radionuclide scans. TREATMENT  Treatment is aimed at managing the symptoms of heart failure. Medicines, behavioral changes, or surgical intervention may be necessary to treat heart failure.  Medicines to help treat heart failure may include:  Angiotensin-converting enzyme (ACE) inhibitors. This type of medicine blocks the effects of a blood protein called angiotensin-converting enzyme. ACE inhibitors relax (dilate) the blood vessels and help lower blood pressure.  Angiotensin receptor blockers (ARBs). This type of medicine  blocks the actions of a blood protein called angiotensin. Angiotensin receptor blockers dilate the blood vessels and help lower blood pressure.  Water pills (diuretics). Diuretics cause the kidneys to remove salt and water from the blood. The extra fluid is removed through urination. This loss of extra fluid lowers the volume of blood the heart pumps.  Beta blockers. These prevent the heart from beating too fast and improve heart muscle strength.  Digitalis. This increases the force of the heartbeat.  Healthy behavior changes include:  Obtaining and maintaining a healthy weight.  Stopping smoking or chewing tobacco.  Eating heart-healthy foods.  Limiting or avoiding alcohol.  Stopping illicit drug use.  Physical activity as directed by your health care provider.  Surgical treatment for heart failure may include:  A procedure to open blocked arteries, repair damaged heart valves, or remove damaged heart muscle tissue.  A pacemaker to improve heart muscle function and control certain abnormal heart rhythms.  An internal cardioverter defibrillator to treat certain serious abnormal heart rhythms.  A left  ventricular assist device (LVAD) to assist the pumping ability of the heart. HOME CARE INSTRUCTIONS   Take medicines only as directed by your health care provider. Medicines are important in reducing the workload of your heart, slowing the progression of heart failure, and improving your symptoms.  Do not stop taking your medicine unless directed by your health care provider.  Do not skip any dose of medicine.  Refill your prescriptions before you run out of medicine. Your medicines are needed every day.  Engage in moderate physical activity if directed by your health care provider. Moderate physical activity can benefit some people. The elderly and people with severe heart failure should consult with a health care provider for physical activity recommendations.  Eat heart-healthy foods. Food choices should be free of trans fat and low in saturated fat, cholesterol, and salt (sodium). Healthy choices include fresh or frozen fruits and vegetables, fish, lean meats, legumes, fat-free or low-fat dairy products, and whole grain or high fiber foods. Talk to a dietitian to learn more about heart-healthy foods.  Limit sodium if directed by your health care provider. Sodium restriction may reduce symptoms of heart failure in some people. Talk to a dietitian to learn more about heart-healthy seasonings.  Use healthy cooking methods. Healthy cooking methods include roasting, grilling, broiling, baking, poaching, steaming, or stir-frying. Talk to a dietitian to learn more about healthy cooking methods.  Limit fluids if directed by your health care provider. Fluid restriction may reduce symptoms of heart failure in some people.  Weigh yourself every day. Daily weights are important in the early recognition of excess fluid. You should weigh yourself every morning after you urinate and before you eat breakfast. Wear the same amount of clothing each time you weigh yourself. Record your daily weight. Provide  your health care provider with your weight record.  Monitor and record your blood pressure if directed by your health care provider.  Check your pulse if directed by your health care provider.  Lose weight if directed by your health care provider. Weight loss may reduce symptoms of heart failure in some people.  Stop smoking or chewing tobacco. Nicotine makes your heart work harder by causing your blood vessels to constrict. Do not use nicotine gum or patches before talking to your health care provider.  Keep all follow-up visits as directed by your health care provider. This is important.  Limit alcohol intake to no more than 1 drink per day  for nonpregnant women and 2 drinks per day for men. One drink equals 12 ounces of beer, 5 ounces of wine, or 1 ounces of hard liquor. Drinking more than that is harmful to your heart. Tell your health care provider if you drink alcohol several times a week. Talk with your health care provider about whether alcohol is safe for you. If your heart has already been damaged by alcohol or you have severe heart failure, drinking alcohol should be stopped completely.  Stop illicit drug use.  Stay up-to-date with immunizations. It is especially important to prevent respiratory infections through current pneumococcal and influenza immunizations.  Manage other health conditions such as hypertension, diabetes, thyroid disease, or abnormal heart rhythms as directed by your health care provider.  Learn to manage stress.  Plan rest periods when fatigued.  Learn strategies to manage high temperatures. If the weather is extremely hot:  Avoid vigorous physical activity.  Use air conditioning or fans or seek a cooler location.  Avoid caffeine and alcohol.  Wear loose-fitting, lightweight, and light-colored clothing.  Learn strategies to manage cold temperatures. If the weather is extremely cold:  Avoid vigorous physical activity.  Layer clothes.  Wear  mittens or gloves, a hat, and a scarf when going outside.  Avoid alcohol.  Obtain ongoing education and support as needed.  Participate in or seek rehabilitation as needed to maintain or improve independence and quality of life. SEEK MEDICAL CARE IF:   You have a rapid weight gain.  You have increasing shortness of breath that is unusual for you.  You are unable to participate in your usual physical activities.  You tire easily.  You cough more than normal, especially with physical activity.  You have any or more swelling in areas such as your hands, feet, ankles, or abdomen.  You are unable to sleep because it is hard to breathe.  You feel like your heart is beating fast (palpitations).  You become dizzy or light-headed upon standing up. SEEK IMMEDIATE MEDICAL CARE IF:   You have difficulty breathing.  There is a change in mental status such as decreased alertness or difficulty with concentration.  You have a pain or discomfort in your chest.  You have an episode of fainting (syncope). MAKE SURE YOU:   Understand these instructions.  Will watch your condition.  Will get help right away if you are not doing well or get worse.   This information is not intended to replace advice given to you by your health care provider. Make sure you discuss any questions you have with your health care provider.   Document Released: 02/21/2005 Document Revised: 07/08/2014 Document Reviewed: 03/23/2012 Elsevier Interactive Patient Education 2016 [place] DASH stands for "Dietary Approaches to Stop Hypertension." The DASH eating plan is a healthy eating plan that has been shown to reduce high blood pressure (hypertension). Additional health benefits may include reducing the risk of type 2 diabetes mellitus, heart disease, and stroke. The DASH eating plan may also help with weight loss. WHAT DO I NEED TO KNOW ABOUT THE DASH EATING PLAN? For the DASH eating plan,  you will follow these general guidelines:  Choose foods with a percent daily value for sodium of less than 5% (as listed on the food label).  Use salt-free seasonings or herbs instead of table salt or sea salt.  Check with your health care provider or pharmacist before using salt substitutes.  Eat lower-sodium products, often labeled as "lower sodium" or "no  salt added."  Eat fresh foods.  Eat more vegetables, fruits, and low-fat dairy products.  Choose whole grains. Look for the word "whole" as the first word in the ingredient list.  Choose fish and skinless chicken or [place] more often than red meat. Limit fish, poultry, and meat to 6 oz (170 g) each day.  Limit sweets, desserts, sugars, and sugary drinks.  Choose heart-healthy fats.  Limit cheese to 1 oz (28 g) per day.  Eat more home-cooked food and less restaurant, buffet, and fast food.  Limit fried foods.  Cook foods using methods other than frying.  Limit canned vegetables. If you do use them, rinse them well to decrease the sodium.  When eating at a restaurant, ask that your food be prepared with less salt, or no salt if possible. WHAT FOODS CAN I EAT? Seek help from a dietitian for individual calorie needs. Grains Whole grain or whole wheat bread. Brown rice. Whole grain or whole wheat pasta. Quinoa, bulgur, and whole grain cereals. Low-sodium cereals. Corn or whole wheat flour tortillas. Whole grain cornbread. Whole grain crackers. Low-sodium crackers. Vegetables Fresh or frozen vegetables (raw, steamed, roasted, or grilled). Low-sodium or reduced-sodium tomato and vegetable juices. Low-sodium or reduced-sodium tomato sauce and paste. Low-sodium or reduced-sodium canned vegetables.  Fruits All fresh, canned (in natural juice), or frozen fruits. Meat and Other Protein Products Ground beef (85% or leaner), grass-fed beef, or beef trimmed of fat. Skinless chicken or [place]. Ground chicken or [place]. Pork trimmed of  fat. All fish and seafood. Eggs. Dried beans, peas, or lentils. Unsalted nuts and seeds. Unsalted canned beans. Dairy Low-fat dairy products, such as skim or 1% milk, 2% or reduced-fat cheeses, low-fat ricotta or cottage cheese, or plain low-fat yogurt. Low-sodium or reduced-sodium cheeses. Fats and Oils Tub margarines without trans fats. Light or reduced-fat mayonnaise and salad dressings (reduced sodium). Avocado. Safflower, olive, or canola oils. Natural peanut or almond butter. Other Unsalted popcorn and pretzels. The items listed above may not be a complete list of recommended foods or beverages. Contact your dietitian for more options. WHAT FOODS ARE NOT RECOMMENDED? Grains White bread. White pasta. White rice. Refined cornbread. Bagels and croissants. Crackers that contain trans fat. Vegetables Creamed or fried vegetables. Vegetables in a cheese sauce. Regular canned vegetables. Regular canned tomato sauce and paste. Regular tomato and vegetable juices. Fruits Dried fruits. Canned fruit in light or heavy syrup. Fruit juice. Meat and Other Protein Products Fatty cuts of meat. Ribs, chicken wings, bacon, sausage, bologna, salami, chitterlings, fatback, hot dogs, bratwurst, and packaged luncheon meats. Salted nuts and seeds. Canned beans with salt. Dairy Whole or 2% milk, cream, half-and-half, and cream cheese. Whole-fat or sweetened yogurt. Full-fat cheeses or blue cheese. Nondairy creamers and whipped toppings. Processed cheese, cheese spreads, or cheese curds. Condiments Onion and garlic salt, seasoned salt, table salt, and sea salt. Canned and packaged gravies. Worcestershire sauce. Tartar sauce. Barbecue sauce. Teriyaki sauce. Soy sauce, including reduced sodium. Steak sauce. Fish sauce. Oyster sauce. Cocktail sauce. Horseradish. Ketchup and mustard. Meat flavorings and tenderizers. Bouillon cubes. Hot sauce. Tabasco sauce. Marinades. Taco seasonings. Relishes. Fats and Oils Butter,  stick margarine, lard, shortening, ghee, and bacon fat. Coconut, palm kernel, or palm oils. Regular salad dressings. Other Pickles and olives. Salted popcorn and pretzels. The items listed above may not be a complete list of foods and beverages to avoid. Contact your dietitian for more information. WHERE CAN I FIND MORE INFORMATION? National Heart, Lung, and Blood Institute: travelstabloid.com  This information is not intended to replace advice given to you by your health care provider. Make sure you discuss any questions you have with your health care provider.   Document Released: 02/10/2011 Document Revised: 03/14/2014 Document Reviewed: 12/26/2012 Elsevier Interactive Patient Education [employer].

## 2015-07-12 NOTE — Progress Notes (Signed)
Pt CVC was bleeding right at shift changed, dressing assessed an reinforced and notified IV team for further evaluation. Find out from [facility] findings that further evaluation was needed for placement confirmation.

## 2015-07-12 NOTE — Progress Notes (Signed)
Subjective:  Denies CP or dyspnea; right hand numbness improving  Objective:   Vital Signs : Filed Vitals:   07/11/15 1312 07/11/15 1645 07/11/15 2040 07/12/15 0423  BP: 124/51 141/61 150/59 142/62  Pulse: 58  73 75  Temp: 98.6 F (37 C)  99.1 F (37.3 C) 98.4 F (36.9 C)  TempSrc: Oral  Oral Oral  Resp: _0 Height:      Weight:      SpO2: 100%  98% 95%    Intake/Output from previous day: No intake or output data in the 24 hours ending 07/12/15 0755  I/O since admission: -4985  Wt Readings from Last 3 Encounters:  07/11/15 229 lb 6.4 oz (104.055 kg)  06/30/15 225 lb (102.059 kg)  03/30/15 225 lb (102.059 kg)    Medications: . amLODipine  5 mg Oral Daily  . aspirin EC  81 mg Oral Daily  . calcitRIOL  0.25 mcg Oral Once per day on Mon Wed Fri  . famotidine  20 mg Oral Daily  . ferrous sulfate  325 mg Oral Daily  . heparin  5,000 Units Subcutaneous Q8H  . hydrALAZINE  37.5 mg Oral Q8H  . insulin aspart  0-20 Units Subcutaneous TID WC  . insulin glargine  55 Units Subcutaneous QHS  . isosorbide mononitrate  60 mg Oral Daily  . latanoprost  1 drop Both Eyes QHS  . metoprolol tartrate  50 mg Oral BID  . rosuvastatin  40 mg Oral QHS  . sodium chloride flush  3 mL Intravenous Q12H  . ticagrelor  90 mg Oral BID  . torsemide  20 mg Oral BID       Physical Exam:   BP LEFT ARM: 145/68 ; BP RIGHT ARM: 52/25 during vascular doppler evaluation on 07/07/15  General appearance: alert, cooperative and no distress Neck: supple Lungs: CTA Heart: RRR Abdomen: soft, non-tender; not distended Extremities: trace to 1+ edema Pulses: 1+ right radial Neurologic: Grossly normal   Rate: 68  Rhythm: normal sinus rhythm   Lab Results:   Recent Labs  07/10/15 0300 07/11/15 0221 07/12/15 0335  NA 139 138 137  136  K 4.5 4.4 4.3  4.3  CL 101 103 104  104  CO2 _1 GLUCOSE 155* 88 156*  158*  BUN 65* 53* 49*  52*  CREATININE 1.88* 1.78* 1.85*   1.76*  CALCIUM 9.1 8.8* 8.8*  8.7*  PHOS 3.2 3.4 2.8    Hepatic Function Latest Ref Rng 07/12/2015 07/11/2015 07/10/2015  Total Protein 6.5 - 8.1 g/dL - - -  Albumin 3.5 - 5.0 g/dL 2.9(L) 2.8(L) 3.0(L)  AST 15 - 41 U/L - - -  ALT 14 - 54 U/L - - -  Alk Phosphatase 38 - 126 U/L - - -  Total Bilirubin 0.3 - 1.2 mg/dL - - -     Recent Labs  07/10/15 0225 07/11/15 0221 07/12/15 0335  WBC 5.3 5.4 6.3  HGB 9.9* 9.3* 9.2*  HCT 31.9* 29.7* 30.3*  MCV 97.6 99.0 98.4  PLT 171 147* 175      Lab Results  Component Value Date   TSH 0.904 07/06/2015    Recent Labs  07/10/15 0300 07/11/15 0221 07/12/15 0335  ALBUMIN 3.0* 2.8* 2.9*    Recent Labs  07/10/15 0225  INR 1.10   BNP (last 3 results)  Recent Labs  07/04/15 1456  BNP 663.6*    Lipid Panel  Component Value Date/Time   CHOL 160 07/07/2015 0259   TRIG 119 07/07/2015 0259   HDL 40* 07/07/2015 0259   CHOLHDL 4.0 07/07/2015 0259   VLDL 24 07/07/2015 0259   LDLCALC 96 07/07/2015 0259      Imaging:  Urgent Cardiac Cath on 07/04/2015 Conclusion     Mid LAD lesion, 60% stenosed.  Prox Cx to Mid Cx lesion, 99% stenosed. The lesion was previously treated with a stent (unknown type).  Ost Cx to Prox Cx lesion, 75% stenosed.  Mid RCA lesion, 85% stenosed. Post intervention, there is a 0% residual stenosis.  Ost RPDA lesion, 99% stenosed. Post intervention, there is a 0% residual stenosis.  Multivessel CAD with diffusely irregular LAD system with 60% stenosis between the first and second diagonal vessel, subtotal/chronic total occlusion of the previously placed tandem circumflex stents with antegrade filling distally with TIMI 2 flow and small caliber diffusely narrowed ostial proximal circumflex prior to the stented segment; and a dominant RCA with eccentric 80-85% mid stenoses and 99% ostial PDA stenosis.  Successful PCI with ultimate insertion of a 2.514 mm Resolute DES stent into the PDA vessel,  postdilated to 2.62 mm with a 99% stenosis being reduced to 0% and no impairment to flow in the jailed continuation branch/PLA vessel, and angiosculpt scoring balloon/DES stenting with a 2.518 mm Resolute stent postdilated to 2.75 mm in the mid RCA with the 85% stenosis being reduced to 0%.  LV pressure 143/28/37  RECOMMENDATION: Continued DAPT indefinitely The patient will continue with bivalirudin for several hours post procedure, particularly in light of the residual subtotal circumflex stenosis. A 2-D echo Doppler study will be obtained to assess LV function. Renal function will be closely monitored. Patient is at risk for subsequent dialysis. If viability is present in the circumflex territory, consider staged intervention of this subtotal/possible chronic total occlusion.         ------------------------------------------------------------------- 07/06/15 ECHO Study Conclusions  - Procedure narrative: Transthoracic echocardiography. Image  quality was poor. The study was technically difficult, as a  result of poor acoustic windows, poor sound wave transmission,  and breast implants. - Left ventricle: The cavity size was normal. There was moderate  concentric hypertrophy. Systolic function was mildly to  moderately reduced. The estimated ejection fraction was in the  range of 40% to 45%. Hypokinesis of the mid-apicalanterolateral  and inferolateral myocardium; consistent with ischemia in the  distribution of the left circumflex coronary artery. Possible  hypokinesis of the mid-apicalanterior myocardium; in the  distribution of the left anterior descending coronary artery.  (Anterior wall not as well visualized). Features are consistent  with a pseudonormal left ventricular filling pattern, with  concomitant abnormal relaxation and increased filling pressure  (grade 2 diastolic dysfunction). - Mitral valve: There was mild regurgitation. - Left atrium: The  atrium was mildly dilated.  Duplex Final Summary:  - The vertebral arteries appear patent with antegrade flow. - Abnormal right subclavian artery. - Findings consistent with 1-39 percent stenosis involving the  right internal carotid artery and the left internal carotid  artery.  Assessment/Plan:   Principal Problem:   NSTEMI (non-ST elevated myocardial infarction) ([place]) Active Problems:   Hyperlipemia   Essential hypertension   CORONARY ATHEROSCLEROSIS NATIVE CORONARY ARTERY   Type 2 diabetes mellitus with stage 4 chronic kidney disease (HCC)   Acute CHF (congestive heart failure) (HCC)   Acute renal failure superimposed on stage 4 chronic kidney disease (HCC)   Elevated d-dimer   Acute on chronic respiratory  failure with hypoxia and hypercapnia (HCC)   Subclavian artery stenosis, right   1. S/p PCI to subtotal ostial PDA stenosis and mid RCA with DES stenting.  No recurrent chest pain or dyspnea. Subtotal /chronic CTO of LCX stent with faint antegrade flow. Will treat medically. Continue ASA, brilinta and statin. Continue metoprolol.  2. CKD: Renal function stable; increase demadex 20 mg BID which is home dose. Repeat BMET one week after DC; fu with nephrology  3. S/p right subclavian artery thrombectomy-per vascular surgery.  3. Progressive anemia; improved with transfusion.  4. HTN:  Continue present meds  5. 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 Demadex as outlined above  6. HLD: continue statin.  7. DM   [name]  07/12/2015, 7:55 AM

## 2015-07-13 ENCOUNTER — Telehealth: Payer: Self-pay | Admitting: "Endocrinology

## 2015-07-13 ENCOUNTER — Encounter (HOSPITAL_COMMUNITY): Payer: Self-pay | Admitting: Surgery

## 2015-07-13 ENCOUNTER — Telehealth: Payer: Self-pay | Admitting: Surgery

## 2015-07-13 NOTE — Telephone Encounter (Signed)
sched appt 07/27/15 at 8:30. Spoke to pt's daughter to inform pt of appt.

## 2015-07-13 NOTE — Telephone Encounter (Signed)
-----   Message from Denman George, RN sent at 07/13/2015  9:27 AM EDT ----- Regarding: needs 2 week f/u with Dr. Trula Slade   ----- Message -----    From: Alvia Grove, PA-C    Sent: 07/11/2015   8:07 AM      To: [facility] Charge Pool  S/p right subclavian thrombectomy 07/10/15  F/u with Dr. Trula Slade in 2 weeks  Thanks Maudie Mercury

## 2015-07-13 NOTE — Telephone Encounter (Signed)
Has been in the hospital for CHF, the hospital told her different directions on how to take her insulin. Wants someone to call her and discuss what exactly she is supposed to do.

## 2015-07-14 ENCOUNTER — Telehealth: Payer: Self-pay

## 2015-07-14 NOTE — Care Management Note (Signed)
Case Management Note Previous CM note initiated by Donne Anon RN, CM  Patient Details  Name: NAVIL OLAGUEZ MRN: [id number] Date of Birth: December 09, 1945  Subjective/Objective:        Adm w mi            Action/Plan: lives w fam, pcp dr hill   Expected Discharge Date:                  Expected Discharge Plan:  Home/Self Care  In-House Referral:     Discharge planning Services  CM Consult  Post Acute Care Choice:    Choice offered to:     DME Arranged:    DME Agency:     HH Arranged:    [place] Agency:     Status of Service:  Completed, signed off  Medicare Important Message Given:  Yes Date Medicare IM Given:    Medicare IM give by:    Date Additional Medicare IM Given:    Additional Medicare Important Message give by:     If discussed at [place] of Stay Meetings, dates discussed:    Additional Comments: gave pt 30day free and copay card for brilinta. Pt states still works and has bc[employer] ins for meds.   07/13/15- 1100- Marvetta Gibbons RN, BSN- received a call post discharge from Lily Kocher- discharging MD- regarding outpt PT/OT needs- pt lives in [place] but has Primary care here locally- wants to do outpt PT/OT at [facility] outpt location at [facility]- call made to pt to verify that she did want to do outpt services at [place] location pt confirmed this- verbal order for outpt PT/OT received from Dr. Lily Kocher- pt staying with family- contact # is [phone]- call made to [facility] out[facility] therapy - spoke with Izora Gala- will fax referral to them for f/u with pt- (fax to [place]- [phone])  07/10/15- 1600- Marvetta Gibbons RN, BSN- pt tx from [facility]- noted above note by previous CM- benefits check submitted for Brilinta- coverage per check  Per rep at [employer]:  Brilinta: $40 for 30 day retail/ $80 for 90 day mail order/ no auth required  Patient can use: [facility]   Spoke with pt and shared above coverage regarding Brilinta- pt has 30 day free card-  CM to continue to  follow  Dawayne Cordie, RN 07/14/2015, 2:32 PM

## 2015-07-14 NOTE — Telephone Encounter (Signed)
Left message for pt to either bring her BG readings to office or call with 3-4 days worth of readings to see if dosage needs to be adjusted.

## 2015-07-14 NOTE — Telephone Encounter (Signed)
Pt has been in the hospital for CHF and now she has been having some lower readings than normal   Date Before breakfast Before lunch Before supper Bedtime  5/8 98 121 132 165  5/9 54                   Pt taking: Tresiba 70 units qhs, Novolog 15-21 units [facility]

## 2015-07-16 NOTE — Telephone Encounter (Signed)
Left message w/ daughter for pt to call back for instructions

## 2015-07-16 NOTE — Telephone Encounter (Signed)
Please, Advise patient to lower Tresiba to 50 units daily at bedtime.

## 2015-07-16 NOTE — Telephone Encounter (Signed)
Pt notified but states she is going to leave it the same for now since her readings are starting to come back up after her hospitalization.

## 2015-07-17 ENCOUNTER — Ambulatory Visit (INDEPENDENT_AMBULATORY_CARE_PROVIDER_SITE_OTHER): Payer: BLUE CROSS/BLUE SHIELD | Admitting: Cardiovascular Disease

## 2015-07-17 ENCOUNTER — Encounter: Payer: Self-pay | Admitting: Cardiovascular Disease

## 2015-07-17 ENCOUNTER — Encounter: Payer: Self-pay | Admitting: *Deleted

## 2015-07-17 VITALS — BP 158/78 | HR 65 | Ht 67.0 in | Wt 228.0 lb

## 2015-07-17 DIAGNOSIS — I214 Non-ST elevation (NSTEMI) myocardial infarction: Secondary | ICD-10-CM | POA: Diagnosis not present

## 2015-07-17 DIAGNOSIS — I10 Essential (primary) hypertension: Secondary | ICD-10-CM

## 2015-07-17 DIAGNOSIS — I2510 Atherosclerotic heart disease of native coronary artery without angina pectoris: Secondary | ICD-10-CM

## 2015-07-17 DIAGNOSIS — I5042 Chronic combined systolic (congestive) and diastolic (congestive) heart failure: Secondary | ICD-10-CM

## 2015-07-17 DIAGNOSIS — I998 Other disorder of circulatory system: Secondary | ICD-10-CM

## 2015-07-17 DIAGNOSIS — Z9289 Personal history of other medical treatment: Secondary | ICD-10-CM

## 2015-07-17 DIAGNOSIS — N184 Chronic kidney disease, stage 4 (severe): Secondary | ICD-10-CM | POA: Diagnosis not present

## 2015-07-17 DIAGNOSIS — I779 Disorder of arteries and arterioles, unspecified: Secondary | ICD-10-CM

## 2015-07-17 DIAGNOSIS — B351 Tinea unguium: Secondary | ICD-10-CM | POA: Diagnosis not present

## 2015-07-17 DIAGNOSIS — E785 Hyperlipidemia, unspecified: Secondary | ICD-10-CM

## 2015-07-17 DIAGNOSIS — I739 Peripheral vascular disease, unspecified: Secondary | ICD-10-CM

## 2015-07-17 DIAGNOSIS — Z87898 Personal history of other specified conditions: Secondary | ICD-10-CM

## 2015-07-17 DIAGNOSIS — E1142 Type 2 diabetes mellitus with diabetic polyneuropathy: Secondary | ICD-10-CM | POA: Diagnosis not present

## 2015-07-17 DIAGNOSIS — Z955 Presence of coronary angioplasty implant and graft: Secondary | ICD-10-CM

## 2015-07-17 MED ORDER — AMLODIPINE BESYLATE 10 MG PO TABS: 10.0000 mg | ORAL_TABLET | Freq: Every day | ORAL | Status: DC

## 2015-07-17 NOTE — Patient Instructions (Addendum)
   Referral to Cardiac Rehab  Increase Amlodipine to 10mg  daily - new sent to [facility] today. Continue all other medications.   Follow up in  3 months

## 2015-07-17 NOTE — Progress Notes (Signed)
Patient ID: [name], female   DOB: [birth year], 70 y.o.   MRN: [id number]      SUBJECTIVE: The patient presents for post hospitalization follow-up. She sustained a non-STEMI in April 2017 and was found to have multivessel coronary artery disease with a 60% stenosis in the mid LAD between the first and second diagonal branches.   She underwent drug-eluting stent placement to a 99% PDA lesion as well as drug-eluting stent placement for an 85% mid RCA lesion. The proximal to mid circumflex was 99% stenosed and had previously been treated with a stent. The ostial to proximal circumflex had a 75% stenosis. It was recommended that if viability were demonstrated in the circumflex territory, staged intervention of the subtotal/possible chronic total occlusion could be considered.  Echocardiogram 07/06/15 showed mild to moderately reduced left ventricular systolic function, LVEF [id number], ischemic wall motion abnormalities, grade 2 diastolic dysfunction, and mild mitral regurgitation.  Carotid Dopplers 07/07/15 showed 1-39% bilateral internal carotid artery stenosis.  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  She currently denies chest pain and shortness of breath. She follows with nephrology at [facility]. She is thinking about not retiring and driving 4 hours a day as it gives her something to do. She still has some right arm numbness and swelling.   Soc: [employer] in [place], [place].   Review of Systems: As per "subjective", otherwise negative.  Allergies  Allergen Reactions  . Ace Inhibitors Cough    Pt can tolerate Tribenzor (and ARBs)  . Codeine Rash    Current Outpatient Prescriptions  Medication Sig Dispense Refill  . acetaminophen (TYLENOL) 500 MG tablet Take 2 tablets (1,000 mg total) by mouth every 6 (six)  hours as needed (pain). 30 tablet 0  . ALPRAZolam (XANAX) 0.5 MG tablet Take 0.5 mg by mouth at bedtime as needed for sleep.     Marland Kitchen amLODipine (NORVASC) 5 MG tablet Take 1 tablet (5 mg total) by mouth daily. 30 tablet 0  . aspirin EC 81 MG tablet Take 81 mg by mouth daily.    . benzonatate (TESSALON) 100 MG capsule Take 100 mg by mouth daily as needed for cough.    . calcitRIOL (ROCALTROL) 0.25 MCG capsule Take 0.25 mcg by mouth every Monday, Wednesday, and Friday.     . famotidine (PEPCID) 20 MG tablet Take 20 mg by mouth daily.    . ferrous sulfate 325 (65 FE) MG tablet Take 325 mg by mouth daily.    Marland Kitchen gabapentin (NEURONTIN) 100 MG capsule Take 100 mg by mouth daily as needed (pain).     Marland Kitchen glucose blood (ACCU-CHEK AVIVA) test strip Use to check blood glucose 4 times a day. E11.65 150 each 5  . hydrALAZINE (APRESOLINE) 25 MG tablet Take 1.5 tablets (37.5 mg total) by mouth every 8 (eight) hours. 90 tablet 0  . Insulin Degludec (TRESIBA FLEXTOUCH) 100 UNIT/ML SOPN Inject 50 Units into the skin at bedtime.    . insulin lispro (HUMALOG KWIKPEN) 100 UNIT/ML KiwkPen Inject 0.15-0.21 mLs (15-21 Units total) into the skin 3 (three) times daily. (Patient taking differently: Inject 15-21 Units into the skin 3 (three) times daily with meals. Per sliding scale) 15 mL 2  . isosorbide mononitrate (IMDUR) 60 MG 24 hr tablet Take 1 tablet (60 mg total) by mouth daily. 30 tablet 0  . latanoprost (XALATAN) 0.005 % ophthalmic  solution Place 1 drop into both eyes at bedtime.     . meclizine (ANTIVERT) 12.5 MG tablet Take 12.5 mg by mouth 2 (two) times daily.     . metoprolol (LOPRESSOR) 50 MG tablet Take 1 tablet (50 mg total) by mouth 2 (two) times daily. 60 tablet 0  . Multiple Vitamin (MULTIVITAMIN WITH MINERALS) TABS tablet Take 1 tablet by mouth daily.    . nitroGLYCERIN (NITROSTAT) 0.4 MG SL tablet Place 1 tablet (0.4 mg total) under the tongue every 5 (five) minutes as needed for chest pain. 30 tablet 0  .  rosuvastatin (CRESTOR) 40 MG tablet Take 1 tablet (40 mg total) by mouth at bedtime. 30 tablet 0  . ticagrelor (BRILINTA) 90 MG TABS tablet Take 1 tablet (90 mg total) by mouth 2 (two) times daily. 60 tablet 0  . torsemide (DEMADEX) 20 MG tablet Take 20 mg by mouth 2 (two) times daily.     Marland Kitchen trimethoprim (TRIMPEX) 100 MG tablet Take 100 mg by mouth daily.      No current facility-administered medications for this visit.    Past Medical History  Diagnosis Date  . Hypertension   . Hyperlipidemia   . Diabetes mellitus   . Obesity   . GERD (gastroesophageal reflux disease)   . Heart murmur, systolic   . Anxiety   . Constipation   . Overactive bladder   . Allergic rhinitis   . Low back pain   . Osteoarthritis   . History of hysterectomy   . Acute CHF (congestive heart failure) ([place]) 07/04/2015  . Acute renal failure superimposed on stage 4 chronic kidney disease ([place]) 07/04/2015    Past Surgical History  Procedure Laterality Date  . Carpal tunnel release    . Trigger finger release    . Cervical biopsy      cervical lymph node biopsies  . Back surgery      multiple  . Coronary stent placement  04/11/2006    2 -- Taxus stents to the circumflex   . Cardiac catheterization  04/04/2006    Est EF of 60%  . Abdominal hysterectomy    . Tendonitis      bilateral elbow  . Colonoscopy  May 2002    Dr. Irving Shows :Followup in 5 years, normal exam  . Colonoscopy  2008    Dr. Laural Golden: Very redundant colon with mild melanosis coli, splenic flexure polyp biopsy with acute complaint of benign colon polyp. Recommended ten-year followup  . Cardiac catheterization N/A 07/04/2015    Procedure: Left Heart Cath and Coronary Angiography;  Surgeon: Troy Sine, MD;  Location: [place] [facility];  Service: Cardiovascular;  Laterality: N/A;  . Cardiac catheterization N/A 07/04/2015    Procedure: Coronary Stent Intervention;  Surgeon: Troy Sine, MD;  Location: [place] [facility];  Service:  Cardiovascular;  Laterality: N/A;  . Peripheral vascular catheterization Right 07/09/2015    Procedure: Upper Extremity Angiography;  Surgeon: Conrad Williamstown, MD;  Location: [place] [facility];  Service: Cardiovascular;  Laterality: Right;  . Peripheral vascular catheterization Right 07/10/2015    Procedure: RIGHT SUBCLAVIAN ARTERY THROMBECTOMY;  Surgeon: Serafina Mitchell, MD;  Location[facility];  Service: Vascular;  Laterality: Right;    Social History   Social History  . Marital Status: Married    Spouse Name: N/A  . Number of Children: 3  . Years of Education: N/A   Occupational History  . Not on file.   Social History  Main Topics  . Smoking status: Never Smoker   . Smokeless tobacco: Never Used  . Alcohol Use: No  . Drug Use: No  . Sexual Activity: Not on file   Other Topics Concern  . Not on file   Social History Narrative     Filed Vitals:   07/17/15 1428  BP: 158/78  Pulse: 65  Height: 5\' 7"  (1.702 m)  Weight: 228 lb (103.42 kg)  SpO2: 95%    PHYSICAL EXAM General: NAD HEENT: Normal. Neck: No JVD, no thyromegaly. Lungs: Clear to auscultation bilaterally with normal respiratory effort. CV: Nondisplaced PMI.  Regular rate and rhythm, normal S1/S2, no S3/S4, no murmur. 1+ pitting pretibial and periankle edema b/l.     Abdomen: Soft, obese.  Neurologic: Alert and oriented.  Psych: Normal affect. Skin: Normal. Musculoskeletal: Mild right hand swelling.    ECG: Most recent ECG reviewed.      ASSESSMENT AND PLAN: 1. CAD s/p NSTEMI with PDA and RCA stenting and CTO of left circumflex: Symptomatically stable. Continue dual antiplatelet therapy indefinitely with aspirin and Brilinta. Continue metoprolol, nitrates, and Crestor. Will make cardiac rehab referral.  2. Essential HTN: Elevated. Increase amlodipine to 10 mg.  3. Hyperlipidemia: Continue Crestor 40 mg.  4. Bilateral carotid artery stenosis: Mild as noted above. Will repeat in 2 years. Continue aspirin  and statin.  5. 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  6. Chronic systolic heart failure: Aim to control blood pressure with increase of amlodipine. Continue torsemide 20 mg twice daily.  7. CKD stage 4: Will need to be closely monitored. BUN 49, creatinine 1.85 on 07/12/15. Follows with nephrology.  Dispo: fu 3 months.  Time spent: 40 minutes, of which greater than 50% was spent reviewing symptoms, relevant blood tests and studies, and discussing management plan with the patient.   Kate Sable, M.D., F.A.C.C.

## 2015-07-20 ENCOUNTER — Telehealth: Payer: Self-pay | Admitting: Cardiovascular Disease

## 2015-07-20 ENCOUNTER — Encounter: Payer: Self-pay | Admitting: *Deleted

## 2015-07-20 DIAGNOSIS — N184 Chronic kidney disease, stage 4 (severe): Secondary | ICD-10-CM | POA: Diagnosis not present

## 2015-07-20 DIAGNOSIS — D649 Anemia, unspecified: Secondary | ICD-10-CM | POA: Diagnosis not present

## 2015-07-20 DIAGNOSIS — Z794 Long term (current) use of insulin: Secondary | ICD-10-CM | POA: Diagnosis not present

## 2015-07-20 DIAGNOSIS — E119 Type 2 diabetes mellitus without complications: Secondary | ICD-10-CM | POA: Diagnosis not present

## 2015-07-20 DIAGNOSIS — I2510 Atherosclerotic heart disease of native coronary artery without angina pectoris: Secondary | ICD-10-CM | POA: Diagnosis not present

## 2015-07-20 DIAGNOSIS — E785 Hyperlipidemia, unspecified: Secondary | ICD-10-CM | POA: Diagnosis not present

## 2015-07-20 DIAGNOSIS — G458 Other transient cerebral ischemic attacks and related syndromes: Secondary | ICD-10-CM | POA: Diagnosis not present

## 2015-07-20 DIAGNOSIS — D631 Anemia in chronic kidney disease: Secondary | ICD-10-CM | POA: Diagnosis not present

## 2015-07-20 DIAGNOSIS — N3020 Other chronic cystitis without hematuria: Secondary | ICD-10-CM | POA: Diagnosis not present

## 2015-07-20 DIAGNOSIS — N2581 Secondary hyperparathyroidism of renal origin: Secondary | ICD-10-CM | POA: Diagnosis not present

## 2015-07-20 DIAGNOSIS — E1122 Type 2 diabetes mellitus with diabetic chronic kidney disease: Secondary | ICD-10-CM | POA: Diagnosis not present

## 2015-07-20 DIAGNOSIS — N3281 Overactive bladder: Secondary | ICD-10-CM | POA: Diagnosis not present

## 2015-07-20 DIAGNOSIS — I129 Hypertensive chronic kidney disease with stage 1 through stage 4 chronic kidney disease, or unspecified chronic kidney disease: Secondary | ICD-10-CM | POA: Diagnosis not present

## 2015-07-20 NOTE — Telephone Encounter (Signed)
Patient states that after discussing with people at work, she needs her note to say end of June.   New note will be provided for patient to pick up.

## 2015-07-20 NOTE — Telephone Encounter (Signed)
[name] came into the office asking to speak with nurse about a note that was written on Friday regarding going back to work.  Please call # [phone]

## 2015-07-23 ENCOUNTER — Encounter: Payer: Self-pay | Admitting: Surgery

## 2015-07-23 ENCOUNTER — Telehealth: Payer: Self-pay

## 2015-07-23 NOTE — Telephone Encounter (Signed)
Pt notified and agrees. 

## 2015-07-23 NOTE — Telephone Encounter (Signed)
Yes she can lower [place] to  50 units and lower Novolog to 10 units TIDAC plus correction.

## 2015-07-23 NOTE — Telephone Encounter (Signed)
Pt states she has had low BG readings.   Date Before breakfast Before lunch Before supper Bedtime  5/16 137 211 75 76  5/17 70 76 71 Did not take Tresiba 70 units due to lower readings all day.  5/18 58             Pt taking: Tresiba 70 units qhs - she did not switch to 50  Units as advised due to better BG readings, novolog 15-21 units TIDAC.   She states she can change to [place] 50 units qhs but is asking if she should change the Novolog dosage as well.

## 2015-07-24 ENCOUNTER — Encounter (HOSPITAL_COMMUNITY)
Admission: RE | Admit: 2015-07-24 | Discharge: 2015-07-24 | Disposition: A | Discharge status: Home / Self Care | Payer: BLUE CROSS/BLUE SHIELD | Source: Ambulatory Visit | Attending: Nephrology | Admitting: Nephrology

## 2015-07-24 ENCOUNTER — Encounter (HOSPITAL_COMMUNITY): Payer: Self-pay

## 2015-07-24 DIAGNOSIS — D509 Iron deficiency anemia, unspecified: Secondary | ICD-10-CM | POA: Diagnosis not present

## 2015-07-24 MED ORDER — SODIUM CHLORIDE 0.9 % IV SOLN
510.0000 mg | Freq: Once | INTRAVENOUS | Status: AC
  Administered 2015-07-24: 510 mg via INTRAVENOUS
  Filled 2015-07-24: qty 17

## 2015-07-24 MED ORDER — SODIUM CHLORIDE 0.9 % IV SOLN
Freq: Once | INTRAVENOUS | Status: AC
Start: 2015-07-24 — End: 2015-07-24
  Administered 2015-07-24: 12:00:00 via INTRAVENOUS

## 2015-07-27 ENCOUNTER — Ambulatory Visit (INDEPENDENT_AMBULATORY_CARE_PROVIDER_SITE_OTHER): Payer: Medicare Other | Admitting: Surgery

## 2015-07-27 ENCOUNTER — Encounter: Payer: Self-pay | Admitting: Surgery

## 2015-07-27 VITALS — BP 155/68 | HR 61 | Temp 98.2°F | Resp 18 | Ht 67.0 in | Wt 219.4 lb

## 2015-07-27 DIAGNOSIS — I748 Embolism and thrombosis of other arteries: Secondary | ICD-10-CM

## 2015-07-27 NOTE — Progress Notes (Signed)
Patient name: [name] MRN: [id number] DOB: 17-Apr-1945 Sex: female  REASON FOR VISIT: post-op  HPI: [name] is a 70 y.o. female who returns for follow-up.  She initially presented to the hospital in May 2017 with a non-STEMI.  At that time, a radial pulse on the right could not be palpated and therefore stenting was done through a femoral approach.  Dr. early was consulted.  The patient had good grip strength however she had some numbness in her hand and no radial pulse.  She underwent angiography which revealed a subclavian artery occlusion.  I took her to the operating room 1 07/10/2015 in through a right brachial exposure in the axilla, I performed thrombectomy of the right subclavian artery, evacuating acute thrombus.  The patient's numbness improved and she developed a palpable pulse.  She was ultimately discharged to home.  She is back today complaining that she still has some numbness in her right hand.  This is affecting her hand writing ability.  She continues to have good strength.  Current Outpatient Prescriptions  Medication Sig Dispense Refill  . acetaminophen (TYLENOL) 500 MG tablet Take 2 tablets (1,000 mg total) by mouth every 6 (six) hours as needed (pain). 30 tablet 0  . ALPRAZolam (XANAX) 0.5 MG tablet Take 0.5 mg by mouth at bedtime as needed for sleep.     Marland Kitchen amLODipine (NORVASC) 10 MG tablet Take 1 tablet (10 mg total) by mouth daily. 30 tablet 6  . aspirin EC 81 MG tablet Take 81 mg by mouth daily.    . benzonatate (TESSALON) 100 MG capsule Take 100 mg by mouth daily as needed for cough.    . calcitRIOL (ROCALTROL) 0.25 MCG capsule Take 0.25 mcg by mouth every Monday, Wednesday, and Friday.     . famotidine (PEPCID) 20 MG tablet Take 20 mg by mouth daily.    . ferrous sulfate 325 (65 FE) MG tablet Take 325 mg by mouth daily.    Marland Kitchen gabapentin (NEURONTIN) 100 MG capsule Take 100 mg by mouth daily as needed (pain).     Marland Kitchen glucose blood (ACCU-CHEK AVIVA) test  strip Use to check blood glucose 4 times a day. E11.65 150 each 5  . hydrALAZINE (APRESOLINE) 25 MG tablet Take 1.5 tablets (37.5 mg total) by mouth every 8 (eight) hours. 90 tablet 0  . Insulin Degludec (TRESIBA FLEXTOUCH) 100 UNIT/ML SOPN Inject 50 Units into the skin at bedtime.    . insulin lispro (HUMALOG KWIKPEN) 100 UNIT/ML KiwkPen Inject 0.15-0.21 mLs (15-21 Units total) into the skin 3 (three) times daily. (Patient taking differently: Inject 15-21 Units into the skin 3 (three) times daily with meals. Per sliding scale) 15 mL 2  . isosorbide mononitrate (IMDUR) 60 MG 24 hr tablet Take 1 tablet (60 mg total) by mouth daily. 30 tablet 0  . latanoprost (XALATAN) 0.005 % ophthalmic solution Place 1 drop into both eyes at bedtime.     . meclizine (ANTIVERT) 12.5 MG tablet Take 12.5 mg by mouth 2 (two) times daily.     . metoprolol (LOPRESSOR) 50 MG tablet Take 1 tablet (50 mg total) by mouth 2 (two) times daily. 60 tablet 0  . Multiple Vitamin (MULTIVITAMIN WITH MINERALS) TABS tablet Take 1 tablet by mouth daily.    . nitroGLYCERIN (NITROSTAT) 0.4 MG SL tablet Place 1 tablet (0.4 mg total) under the tongue every 5 (five) minutes as needed for chest pain. 30 tablet 0  . rosuvastatin (CRESTOR) 40 MG tablet  Take 1 tablet (40 mg total) by mouth at bedtime. 30 tablet 0  . ticagrelor (BRILINTA) 90 MG TABS tablet Take 1 tablet (90 mg total) by mouth 2 (two) times daily. 60 tablet 0  . torsemide (DEMADEX) 20 MG tablet Take 20 mg by mouth 2 (two) times daily.     Marland Kitchen trimethoprim (TRIMPEX) 100 MG tablet Take 100 mg by mouth daily.      No current facility-administered medications for this visit.    REVIEW OF SYSTEMS:  [X]  denotes positive finding, [ ]  denotes negative finding Cardiac  Comments:  Chest pain or chest pressure:    Shortness of breath upon exertion:    Short of breath when lying flat:    Irregular heart rhythm:    Constitutional    Fever or chills:      PHYSICAL EXAM: Filed Vitals:    07/27/15 0836 07/27/15 0838  BP: 145/71 155/68  Pulse: 61   Temp: 98.2 F (36.8 C)   TempSrc: Oral   Resp: 18   Height: 5\' 7"  (1.702 m)   Weight: 219 lb 6.4 oz (99.519 kg)   SpO2: 99%     GENERAL: The patient is a well-nourished female, in no acute distress. The vital signs are documented above. CARDIOVASCULAR: There is a regular rate and rhythm. PULMONARY: There is good air exchange bilaterally without wheezing or rales. Palpable right radial pulse Good grip strength in the right hand  MEDICAL ISSUES: Acute right subclavian artery thrombus: The patient has good perfusion of her right hand.  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  I am going to refer her to the hand therapy rehabilitation clinic in [place].  I suspect this will continue to improve but I did discuss with her that she may not regain full sensation.  I will have her follow-up in 6 months to see how she has progressed.  Annamarie Major [facility] [employer]: [phone]

## 2015-07-29 ENCOUNTER — Telehealth: Payer: Self-pay

## 2015-07-29 NOTE — Telephone Encounter (Signed)
This low blood glucose results are probably due to not eating enough. I will advise patient to decrease Tresiba to 40 units daily at bedtime,  Hold NovoLog for now. She needs to be Be consistent testing 4 times a day and report if blood glucose is still dropping.

## 2015-07-29 NOTE — Telephone Encounter (Signed)
Pt states she is having low BG readings. Pts sister states that Mrs. Kuhrt has been acting "Deziel" such as saying things that make no since. She wasn't sure if this was from the low BG readings.   Date Before breakfast Before lunch Before supper Bedtime  5/21 126 133 140   5/22 91 61 147 239  5/23 128 40 106 114  5/24 50       Pt taking: Tresiba 50 units qhs, Novolog 10-16 units TIDAC

## 2015-07-29 NOTE — Telephone Encounter (Signed)
Pt's husband notified.

## 2015-07-31 ENCOUNTER — Other Ambulatory Visit: Payer: Self-pay

## 2015-07-31 DIAGNOSIS — I8290 Acute embolism and thrombosis of unspecified vein: Secondary | ICD-10-CM

## 2015-07-31 NOTE — Addendum Note (Signed)
Addended by: Thresa Ross C on: 07/31/2015 12:53 PM   Modules accepted: Orders

## 2015-08-06 DIAGNOSIS — M79641 Pain in right hand: Secondary | ICD-10-CM | POA: Diagnosis not present

## 2015-08-06 DIAGNOSIS — M62541 Muscle wasting and atrophy, not elsewhere classified, right hand: Secondary | ICD-10-CM | POA: Diagnosis not present

## 2015-08-06 DIAGNOSIS — G5681 Other specified mononeuropathies of right upper limb: Secondary | ICD-10-CM | POA: Diagnosis not present

## 2015-08-10 ENCOUNTER — Telehealth (HOSPITAL_COMMUNITY): Payer: Self-pay

## 2015-08-10 ENCOUNTER — Ambulatory Visit (HOSPITAL_COMMUNITY): Payer: Medicare Other | Admitting: Physical Therapy

## 2015-08-10 DIAGNOSIS — I2510 Atherosclerotic heart disease of native coronary artery without angina pectoris: Secondary | ICD-10-CM | POA: Diagnosis not present

## 2015-08-10 DIAGNOSIS — Z6831 Body mass index (BMI) 31.0-31.9, adult: Secondary | ICD-10-CM | POA: Diagnosis not present

## 2015-08-10 DIAGNOSIS — N184 Chronic kidney disease, stage 4 (severe): Secondary | ICD-10-CM | POA: Diagnosis not present

## 2015-08-10 DIAGNOSIS — E1122 Type 2 diabetes mellitus with diabetic chronic kidney disease: Secondary | ICD-10-CM | POA: Diagnosis not present

## 2015-08-11 DIAGNOSIS — G5681 Other specified mononeuropathies of right upper limb: Secondary | ICD-10-CM | POA: Diagnosis not present

## 2015-08-11 DIAGNOSIS — M62541 Muscle wasting and atrophy, not elsewhere classified, right hand: Secondary | ICD-10-CM | POA: Diagnosis not present

## 2015-08-11 DIAGNOSIS — M79641 Pain in right hand: Secondary | ICD-10-CM | POA: Diagnosis not present

## 2015-08-12 DIAGNOSIS — G5681 Other specified mononeuropathies of right upper limb: Secondary | ICD-10-CM | POA: Diagnosis not present

## 2015-08-12 DIAGNOSIS — M62541 Muscle wasting and atrophy, not elsewhere classified, right hand: Secondary | ICD-10-CM | POA: Diagnosis not present

## 2015-08-12 DIAGNOSIS — M79641 Pain in right hand: Secondary | ICD-10-CM | POA: Diagnosis not present

## 2015-08-17 DIAGNOSIS — M62541 Muscle wasting and atrophy, not elsewhere classified, right hand: Secondary | ICD-10-CM | POA: Diagnosis not present

## 2015-08-17 DIAGNOSIS — G5681 Other specified mononeuropathies of right upper limb: Secondary | ICD-10-CM | POA: Diagnosis not present

## 2015-08-17 DIAGNOSIS — M79641 Pain in right hand: Secondary | ICD-10-CM | POA: Diagnosis not present

## 2015-09-25 ENCOUNTER — Other Ambulatory Visit: Payer: Self-pay | Admitting: "Endocrinology

## 2015-09-26 LAB — COMPREHENSIVE METABOLIC PANEL
ALK PHOS: 103 IU/L (ref 39–117)
ALT: 25 IU/L (ref 0–32)
AST: 27 IU/L (ref 0–40)
Albumin/Globulin Ratio: 1.4 (ref 1.2–2.2)
Albumin: 4.6 g/dL (ref 3.5–4.8)
BILIRUBIN TOTAL: 0.3 mg/dL (ref 0.0–1.2)
BUN/Creatinine Ratio: 22 (ref 12–28)
BUN: 42 mg/dL — ABNORMAL HIGH (ref 8–27)
CHLORIDE: 101 mmol/L (ref 96–106)
CO2: 25 mmol/L (ref 18–29)
CREATININE: 1.87 mg/dL — AB (ref 0.57–1.00)
Calcium: 10.1 mg/dL (ref 8.7–10.3)
GFR calc Af Amer: 31 mL/min/{1.73_m2} — ABNORMAL LOW (ref >59)
GFR calc non Af Amer: 27 mL/min/{1.73_m2} — ABNORMAL LOW (ref >59)
GLOBULIN, TOTAL: 3.3 g/dL (ref 1.5–4.5)
Glucose: 46 mg/dL — ABNORMAL LOW (ref 65–99)
POTASSIUM: 4.1 mmol/L (ref 3.5–5.2)
SODIUM: 144 mmol/L (ref 134–144)
Total Protein: 7.9 g/dL (ref 6.0–8.5)

## 2015-09-26 LAB — HGB A1C W/O EAG: HEMOGLOBIN A1C: 8.7 % — AB (ref 4.8–5.6)

## 2015-09-28 DIAGNOSIS — E7800 Pure hypercholesterolemia, unspecified: Secondary | ICD-10-CM | POA: Diagnosis present

## 2015-09-28 DIAGNOSIS — Z955 Presence of coronary angioplasty implant and graft: Secondary | ICD-10-CM | POA: Diagnosis not present

## 2015-09-28 DIAGNOSIS — Z79899 Other long term (current) drug therapy: Secondary | ICD-10-CM | POA: Diagnosis not present

## 2015-09-28 DIAGNOSIS — Z7982 Long term (current) use of aspirin: Secondary | ICD-10-CM | POA: Diagnosis not present

## 2015-09-28 DIAGNOSIS — R0902 Hypoxemia: Secondary | ICD-10-CM | POA: Diagnosis not present

## 2015-09-28 DIAGNOSIS — Z794 Long term (current) use of insulin: Secondary | ICD-10-CM | POA: Diagnosis not present

## 2015-09-28 DIAGNOSIS — N183 Chronic kidney disease, stage 3 (moderate): Secondary | ICD-10-CM | POA: Diagnosis present

## 2015-09-28 DIAGNOSIS — J9801 Acute bronchospasm: Secondary | ICD-10-CM | POA: Diagnosis not present

## 2015-09-28 DIAGNOSIS — I252 Old myocardial infarction: Secondary | ICD-10-CM | POA: Diagnosis not present

## 2015-09-28 DIAGNOSIS — I5041 Acute combined systolic (congestive) and diastolic (congestive) heart failure: Secondary | ICD-10-CM | POA: Diagnosis not present

## 2015-09-28 DIAGNOSIS — E1122 Type 2 diabetes mellitus with diabetic chronic kidney disease: Secondary | ICD-10-CM | POA: Diagnosis not present

## 2015-09-28 DIAGNOSIS — I2510 Atherosclerotic heart disease of native coronary artery without angina pectoris: Secondary | ICD-10-CM | POA: Diagnosis not present

## 2015-09-28 DIAGNOSIS — I130 Hypertensive heart and chronic kidney disease with heart failure and stage 1 through stage 4 chronic kidney disease, or unspecified chronic kidney disease: Secondary | ICD-10-CM | POA: Diagnosis present

## 2015-09-28 DIAGNOSIS — Z886 Allergy status to analgesic agent status: Secondary | ICD-10-CM | POA: Diagnosis not present

## 2015-09-28 DIAGNOSIS — Z888 Allergy status to other drugs, medicaments and biological substances status: Secondary | ICD-10-CM | POA: Diagnosis not present

## 2015-09-28 DIAGNOSIS — K219 Gastro-esophageal reflux disease without esophagitis: Secondary | ICD-10-CM | POA: Diagnosis present

## 2015-09-28 DIAGNOSIS — E785 Hyperlipidemia, unspecified: Secondary | ICD-10-CM | POA: Diagnosis present

## 2015-09-29 ENCOUNTER — Ambulatory Visit: Payer: Medicare Other | Admitting: "Endocrinology

## 2015-10-06 ENCOUNTER — Other Ambulatory Visit: Payer: Self-pay | Admitting: *Deleted

## 2015-10-06 DIAGNOSIS — N186 End stage renal disease: Secondary | ICD-10-CM

## 2015-10-06 DIAGNOSIS — Z992 Dependence on renal dialysis: Principal | ICD-10-CM

## 2015-10-10 ENCOUNTER — Encounter (HOSPITAL_COMMUNITY): Payer: Self-pay

## 2015-10-10 ENCOUNTER — Emergency Department (HOSPITAL_COMMUNITY): Payer: BLUE CROSS/BLUE SHIELD

## 2015-10-10 ENCOUNTER — Inpatient Hospital Stay (HOSPITAL_COMMUNITY)
Admission: EM | Admit: 2015-10-10 | Discharge: 2015-10-14 | DRG: 250 | Disposition: A | Discharge status: Home / Self Care | Payer: BLUE CROSS/BLUE SHIELD | Attending: Family Medicine | Admitting: Family Medicine

## 2015-10-10 DIAGNOSIS — E1122 Type 2 diabetes mellitus with diabetic chronic kidney disease: Secondary | ICD-10-CM | POA: Diagnosis not present

## 2015-10-10 DIAGNOSIS — M1990 Unspecified osteoarthritis, unspecified site: Secondary | ICD-10-CM | POA: Diagnosis present

## 2015-10-10 DIAGNOSIS — I10 Essential (primary) hypertension: Secondary | ICD-10-CM | POA: Diagnosis present

## 2015-10-10 DIAGNOSIS — N184 Chronic kidney disease, stage 4 (severe): Secondary | ICD-10-CM | POA: Diagnosis present

## 2015-10-10 DIAGNOSIS — I214 Non-ST elevation (NSTEMI) myocardial infarction: Secondary | ICD-10-CM | POA: Diagnosis present

## 2015-10-10 DIAGNOSIS — N179 Acute kidney failure, unspecified: Secondary | ICD-10-CM | POA: Diagnosis not present

## 2015-10-10 DIAGNOSIS — I25110 Atherosclerotic heart disease of native coronary artery with unstable angina pectoris: Secondary | ICD-10-CM | POA: Diagnosis present

## 2015-10-10 DIAGNOSIS — E663 Overweight: Secondary | ICD-10-CM | POA: Diagnosis present

## 2015-10-10 DIAGNOSIS — D649 Anemia, unspecified: Secondary | ICD-10-CM | POA: Diagnosis present

## 2015-10-10 DIAGNOSIS — K219 Gastro-esophageal reflux disease without esophagitis: Secondary | ICD-10-CM | POA: Diagnosis present

## 2015-10-10 DIAGNOSIS — R0902 Hypoxemia: Secondary | ICD-10-CM

## 2015-10-10 DIAGNOSIS — I5043 Acute on chronic combined systolic (congestive) and diastolic (congestive) heart failure: Secondary | ICD-10-CM | POA: Diagnosis not present

## 2015-10-10 DIAGNOSIS — I2510 Atherosclerotic heart disease of native coronary artery without angina pectoris: Secondary | ICD-10-CM | POA: Diagnosis present

## 2015-10-10 DIAGNOSIS — Z955 Presence of coronary angioplasty implant and graft: Secondary | ICD-10-CM

## 2015-10-10 DIAGNOSIS — I248 Other forms of acute ischemic heart disease: Secondary | ICD-10-CM | POA: Diagnosis present

## 2015-10-10 DIAGNOSIS — Z6830 Body mass index (BMI) 30.0-30.9, adult: Secondary | ICD-10-CM

## 2015-10-10 DIAGNOSIS — E669 Obesity, unspecified: Secondary | ICD-10-CM | POA: Diagnosis present

## 2015-10-10 DIAGNOSIS — I130 Hypertensive heart and chronic kidney disease with heart failure and stage 1 through stage 4 chronic kidney disease, or unspecified chronic kidney disease: Secondary | ICD-10-CM | POA: Diagnosis present

## 2015-10-10 DIAGNOSIS — Z794 Long term (current) use of insulin: Secondary | ICD-10-CM | POA: Diagnosis not present

## 2015-10-10 DIAGNOSIS — E785 Hyperlipidemia, unspecified: Secondary | ICD-10-CM | POA: Diagnosis present

## 2015-10-10 DIAGNOSIS — Z7982 Long term (current) use of aspirin: Secondary | ICD-10-CM | POA: Diagnosis not present

## 2015-10-10 DIAGNOSIS — I509 Heart failure, unspecified: Secondary | ICD-10-CM

## 2015-10-10 DIAGNOSIS — Z9861 Coronary angioplasty status: Secondary | ICD-10-CM

## 2015-10-10 DIAGNOSIS — I110 Hypertensive heart disease with heart failure: Secondary | ICD-10-CM | POA: Diagnosis not present

## 2015-10-10 DIAGNOSIS — R079 Chest pain, unspecified: Secondary | ICD-10-CM | POA: Diagnosis not present

## 2015-10-10 DIAGNOSIS — J9602 Acute respiratory failure with hypercapnia: Secondary | ICD-10-CM | POA: Diagnosis not present

## 2015-10-10 DIAGNOSIS — J9600 Acute respiratory failure, unspecified whether with hypoxia or hypercapnia: Secondary | ICD-10-CM

## 2015-10-10 DIAGNOSIS — E872 Acidosis: Secondary | ICD-10-CM | POA: Diagnosis not present

## 2015-10-10 DIAGNOSIS — Z79899 Other long term (current) drug therapy: Secondary | ICD-10-CM | POA: Diagnosis not present

## 2015-10-10 DIAGNOSIS — Z90710 Acquired absence of both cervix and uterus: Secondary | ICD-10-CM

## 2015-10-10 DIAGNOSIS — R0602 Shortness of breath: Secondary | ICD-10-CM | POA: Diagnosis not present

## 2015-10-10 DIAGNOSIS — N3281 Overactive bladder: Secondary | ICD-10-CM | POA: Diagnosis present

## 2015-10-10 LAB — BLOOD GAS, ARTERIAL
Acid-Base Excess: 1.6 mmol/L (ref 0.0–2.0)
Bicarbonate: 25.1 mEq/L — ABNORMAL HIGH (ref 20.0–24.0)
Delivery systems: POSITIVE
Drawn by: 105551
Expiratory PAP: 6
FIO2: 100
Inspiratory PAP: 14
LHR: 8 {breaths}/min
O2 SAT: 96.2 %
PCO2 ART: 53.5 mmHg — AB (ref 35.0–45.0)
pH, Arterial: 7.324 — ABNORMAL LOW (ref 7.350–7.450)
pO2, Arterial: 98.6 mmHg (ref 80.0–100.0)

## 2015-10-10 LAB — BASIC METABOLIC PANEL
Anion gap: 11 (ref 5–15)
BUN: 51 mg/dL — ABNORMAL HIGH (ref 6–20)
CALCIUM: 9.3 mg/dL (ref 8.9–10.3)
CO2: 25 mmol/L (ref 22–32)
CREATININE: 2.5 mg/dL — AB (ref 0.44–1.00)
Chloride: 99 mmol/L — ABNORMAL LOW (ref 101–111)
GFR calc non Af Amer: 18 mL/min — ABNORMAL LOW (ref >60)
GFR, EST AFRICAN AMERICAN: 21 mL/min — AB (ref >60)
Glucose, Bld: 357 mg/dL — ABNORMAL HIGH (ref 65–99)
Potassium: 3.5 mmol/L (ref 3.5–5.1)
SODIUM: 135 mmol/L (ref 135–145)

## 2015-10-10 LAB — CBC WITH DIFFERENTIAL/PLATELET
BASOS PCT: 0 %
Basophils Absolute: 0 10*3/uL (ref 0.0–0.1)
EOS ABS: 0.2 10*3/uL (ref 0.0–0.7)
EOS PCT: 2 %
HCT: 38.5 % (ref 36.0–46.0)
Hemoglobin: 12 g/dL (ref 12.0–15.0)
Lymphocytes Relative: 24 %
Lymphs Abs: 2.4 10*3/uL (ref 0.7–4.0)
MCH: 32.1 pg (ref 26.0–34.0)
MCHC: 31.2 g/dL (ref 30.0–36.0)
MCV: 102.9 fL — ABNORMAL HIGH (ref 78.0–100.0)
MONO ABS: 0.5 10*3/uL (ref 0.1–1.0)
MONOS PCT: 5 %
Neutro Abs: 7.1 10*3/uL (ref 1.7–7.7)
Neutrophils Relative %: 69 %
Platelets: 215 10*3/uL (ref 150–400)
RBC: 3.74 MIL/uL — ABNORMAL LOW (ref 3.87–5.11)
RDW: 16.1 % — AB (ref 11.5–15.5)
WBC: 10.2 10*3/uL (ref 4.0–10.5)

## 2015-10-10 LAB — GLUCOSE, CAPILLARY
GLUCOSE-CAPILLARY: 366 mg/dL — AB (ref 65–99)
GLUCOSE-CAPILLARY: 287 mg/dL — AB (ref 65–99)
GLUCOSE-CAPILLARY: 218 mg/dL — AB (ref 65–99)
Glucose-Capillary: 148 mg/dL — ABNORMAL HIGH (ref 65–99)
Glucose-Capillary: 171 mg/dL — ABNORMAL HIGH (ref 65–99)

## 2015-10-10 LAB — TROPONIN I
TROPONIN I: 0.06 ng/mL — AB (ref <0.03)
TROPONIN I: 0.35 ng/mL — AB (ref <0.03)
TROPONIN I: 1.62 ng/mL — AB (ref <0.03)
TROPONIN I: 1.58 ng/mL — AB (ref <0.03)

## 2015-10-10 LAB — MRSA PCR SCREENING: MRSA BY PCR: NEGATIVE

## 2015-10-10 LAB — TSH: TSH: 0.743 u[IU]/mL (ref 0.350–4.500)

## 2015-10-10 LAB — BRAIN NATRIURETIC PEPTIDE: B NATRIURETIC PEPTIDE 5: 335 pg/mL — AB (ref 0.0–100.0)

## 2015-10-10 MED ORDER — FUROSEMIDE 10 MG/ML IJ SOLN
80.0000 mg | Freq: Two times a day (BID) | INTRAMUSCULAR | Status: DC
  Administered 2015-10-10 – 2015-10-11 (×2): 80 mg via INTRAVENOUS
  Filled 2015-10-10 (×2): qty 8

## 2015-10-10 MED ORDER — ASPIRIN 300 MG RE SUPP
300.0000 mg | Freq: Once | RECTAL | Status: AC
Start: 2015-10-10 — End: 2015-10-10
  Administered 2015-10-10: 300 mg via RECTAL
  Filled 2015-10-10: qty 1

## 2015-10-10 MED ORDER — SODIUM CHLORIDE 0.9% FLUSH
3.0000 mL | Freq: Two times a day (BID) | INTRAVENOUS | Status: DC
  Administered 2015-10-10 – 2015-10-14 (×8): 3 mL via INTRAVENOUS

## 2015-10-10 MED ORDER — METOPROLOL TARTRATE 50 MG PO TABS
50.0000 mg | ORAL_TABLET | Freq: Two times a day (BID) | ORAL | Status: DC
  Administered 2015-10-10: 50 mg via ORAL
  Filled 2015-10-10: qty 1

## 2015-10-10 MED ORDER — ASPIRIN EC 81 MG PO TBEC
81.0000 mg | DELAYED_RELEASE_TABLET | Freq: Every day | ORAL | Status: DC
  Administered 2015-10-10 – 2015-10-14 (×5): 81 mg via ORAL
  Filled 2015-10-10 (×5): qty 1

## 2015-10-10 MED ORDER — FUROSEMIDE 10 MG/ML IJ SOLN
40.0000 mg | Freq: Once | INTRAMUSCULAR | Status: AC
  Administered 2015-10-10: 40 mg via INTRAVENOUS
  Filled 2015-10-10: qty 4

## 2015-10-10 MED ORDER — ISOSORBIDE MONONITRATE ER 60 MG PO TB24
60.0000 mg | ORAL_TABLET | Freq: Every day | ORAL | Status: DC
  Administered 2015-10-10 – 2015-10-14 (×5): 60 mg via ORAL
  Filled 2015-10-10 (×5): qty 1

## 2015-10-10 MED ORDER — INSULIN ASPART 100 UNIT/ML ~~LOC~~ SOLN
0.0000 [IU] | SUBCUTANEOUS | Status: DC
  Administered 2015-10-10: 3 [IU] via SUBCUTANEOUS
  Administered 2015-10-10: 2 [IU] via SUBCUTANEOUS
  Administered 2015-10-10: 9 [IU] via SUBCUTANEOUS
  Administered 2015-10-10: 5 [IU] via SUBCUTANEOUS
  Administered 2015-10-11: 9 [IU] via SUBCUTANEOUS
  Administered 2015-10-11: 2 [IU] via SUBCUTANEOUS

## 2015-10-10 MED ORDER — FUROSEMIDE 10 MG/ML IJ SOLN
40.0000 mg | Freq: Two times a day (BID) | INTRAMUSCULAR | Status: DC
  Filled 2015-10-10: qty 4

## 2015-10-10 MED ORDER — ONDANSETRON HCL 4 MG/2ML IJ SOLN: 4.0000 mg | Freq: Four times a day (QID) | INTRAMUSCULAR | Status: DC | PRN

## 2015-10-10 MED ORDER — HEPARIN SODIUM (PORCINE) 5000 UNIT/ML IJ SOLN
5000.0000 [IU] | Freq: Three times a day (TID) | INTRAMUSCULAR | Status: DC
  Administered 2015-10-10 – 2015-10-14 (×10): 5000 [IU] via SUBCUTANEOUS
  Filled 2015-10-10 (×11): qty 1

## 2015-10-10 MED ORDER — SODIUM CHLORIDE 0.9 % IV SOLN: 250.0000 mL | INTRAVENOUS | Status: DC | PRN

## 2015-10-10 MED ORDER — CHLORHEXIDINE GLUCONATE 0.12 % MT SOLN
15.0000 mL | Freq: Two times a day (BID) | OROMUCOSAL | Status: DC
  Administered 2015-10-10 – 2015-10-12 (×4): 15 mL via OROMUCOSAL
  Filled 2015-10-10 (×9): qty 15

## 2015-10-10 MED ORDER — ACETAMINOPHEN 325 MG PO TABS: 650.0000 mg | ORAL_TABLET | ORAL | Status: DC | PRN

## 2015-10-10 MED ORDER — POTASSIUM CHLORIDE CRYS ER 20 MEQ PO TBCR
40.0000 meq | EXTENDED_RELEASE_TABLET | Freq: Every day | ORAL | Status: DC
  Administered 2015-10-10 – 2015-10-14 (×5): 40 meq via ORAL
  Filled 2015-10-10 (×2): qty 2
  Filled 2015-10-10: qty 4
  Filled 2015-10-10 (×2): qty 2

## 2015-10-10 MED ORDER — TICAGRELOR 90 MG PO TABS
90.0000 mg | ORAL_TABLET | Freq: Two times a day (BID) | ORAL | Status: DC
  Administered 2015-10-10 – 2015-10-14 (×8): 90 mg via ORAL
  Filled 2015-10-10 (×11): qty 1

## 2015-10-10 MED ORDER — NITROGLYCERIN 2 % TD OINT
0.5000 [in_us] | TOPICAL_OINTMENT | Freq: Once | TRANSDERMAL | Status: AC
  Administered 2015-10-10: 0.5 [in_us] via TOPICAL
  Filled 2015-10-10: qty 1

## 2015-10-10 MED ORDER — ALPRAZOLAM 0.5 MG PO TABS
0.5000 mg | ORAL_TABLET | Freq: Every evening | ORAL | Status: DC | PRN
  Administered 2015-10-12 (×2): 0.5 mg via ORAL
  Filled 2015-10-10 (×3): qty 1

## 2015-10-10 MED ORDER — BENZONATATE 100 MG PO CAPS
200.0000 mg | ORAL_CAPSULE | Freq: Three times a day (TID) | ORAL | Status: DC | PRN
  Administered 2015-10-10 – 2015-10-11 (×2): 200 mg via ORAL
  Filled 2015-10-10 (×2): qty 2

## 2015-10-10 MED ORDER — SODIUM CHLORIDE 0.9% FLUSH: 3.0000 mL | INTRAVENOUS | Status: DC | PRN

## 2015-10-10 MED ORDER — AMLODIPINE BESYLATE 10 MG PO TABS
10.0000 mg | ORAL_TABLET | Freq: Every day | ORAL | Status: DC
  Administered 2015-10-10 – 2015-10-14 (×5): 10 mg via ORAL
  Filled 2015-10-10 (×2): qty 2
  Filled 2015-10-10: qty 1
  Filled 2015-10-10 (×2): qty 2

## 2015-10-10 MED ORDER — CETYLPYRIDINIUM CHLORIDE 0.05 % MT LIQD
7.0000 mL | Freq: Two times a day (BID) | OROMUCOSAL | Status: DC
  Administered 2015-10-10 – 2015-10-12 (×5): 7 mL via OROMUCOSAL

## 2015-10-10 MED ORDER — HYDRALAZINE HCL 25 MG PO TABS
37.5000 mg | ORAL_TABLET | Freq: Three times a day (TID) | ORAL | Status: DC
  Administered 2015-10-10 – 2015-10-14 (×11): 37.5 mg via ORAL
  Filled 2015-10-10 (×12): qty 2

## 2015-10-10 NOTE — Progress Notes (Deleted)
Report to Val RN to assume care of patient at transfer to 21, son aware of transfer. All belongings sent with patient. Message related to Val that Dr. Laural Golden wants to be contacted after lunch to be notified about amount of intake and toleration of meal.

## 2015-10-10 NOTE — Progress Notes (Signed)
CRITICAL VALUE ALERT  Critical value received:  Troponin   Date of notification: 10/10/15   Time of notification:  0842  Critical value read back:YES    Nurse who received alert: Audry Pili RN   MD notified (1st page): Sarajane Jews  Time of first page:  [phone]  MD notified (2nd page):  Time of second page:  Responding MD: Sarajane Jews  Time MD responded:  [phone]

## 2015-10-10 NOTE — Consult Note (Signed)
Reason for Consult: Fluid overload and chronic renal failure Referring Physician: Dr. Coral Else P Prohaska is an 70 y.o. female.  HPI: She is a patient with long-standing history of diabetes, hypertension, coronary artery disease status post stent placement, chronic renal failure stage IV and being followed by [facility] presently came with complaints of difficulty breathing and orthopnea of one-day duration. Patient states that she has similar episode before and last one was about 2 weeks ago for which she was admitted to [facility]. Presently patient denies any nausea or vomiting. She states that she's feeling much better. She denies any chest pain, cough or sputum production.  Past Medical History:  Diagnosis Date  . Acute CHF (congestive heart failure) ([place]) 07/04/2015  . Acute renal failure superimposed on stage 4 chronic kidney disease ([place]) 07/04/2015  . Allergic rhinitis   . Anxiety   . Constipation   . Diabetes mellitus   . GERD (gastroesophageal reflux disease)   . Heart murmur, systolic   . History of hysterectomy   . Hyperlipidemia   . Hypertension   . Low back pain   . Obesity   . Osteoarthritis   . Overactive bladder     Past Surgical History:  Procedure Laterality Date  . ABDOMINAL HYSTERECTOMY    . BACK SURGERY     multiple  . CARDIAC CATHETERIZATION  04/04/2006   Est EF of 60%  . CARDIAC CATHETERIZATION N/A 07/04/2015   Procedure: Left Heart Cath and Coronary Angiography;  Surgeon: Troy Sine, MD;  Location: [facility];  Service: Cardiovascular;  Laterality: N/A;  . CARDIAC CATHETERIZATION N/A 07/04/2015   Procedure: Coronary Stent Intervention;  Surgeon: Troy Sine, MD;  Location: [facility];  Service: Cardiovascular;  Laterality: N/A;  . CARPAL TUNNEL RELEASE    . CERVICAL BIOPSY     cervical lymph node biopsies  . COLONOSCOPY  May 2002   Dr. Irving Shows :Followup in 5 years, normal exam  . COLONOSCOPY  2008   Dr. Laural Golden: Very redundant colon with mild melanosis coli, splenic flexure polyp biopsy with acute complaint of benign colon polyp. Recommended ten-year followup  . CORONARY STENT PLACEMENT  04/11/2006   2 -- Taxus stents to the circumflex   . PERIPHERAL VASCULAR CATHETERIZATION Right 07/09/2015   Procedure: Upper Extremity Angiography;  Surgeon: Conrad [place] Lake, MD;  Location: [facility];  Service: Cardiovascular;  Laterality: Right;  . PERIPHERAL VASCULAR CATHETERIZATION Right 07/10/2015   Procedure: RIGHT SUBCLAVIAN ARTERY THROMBECTOMY;  Surgeon: Serafina Mitchell, MD;  Location: [facility];  Service: Vascular;  Laterality: Right;  . tendonitis     bilateral elbow  . TRIGGER FINGER RELEASE      Family History  Problem Relation Age of Onset  . Diabetes Father   . Hypertension Father   . Stroke Father   . Hypertension Brother   . Aneurysm Brother   . Diabetes Brother   . Colon cancer Neg Hx     Social History:  reports that she has never smoked. She has never used smokeless tobacco. She reports that she does not drink alcohol or use drugs.  Allergies:  Allergies  Allergen Reactions  . Ace Inhibitors Cough    Pt can tolerate Tribenzor (and ARBs)  . Codeine Rash    Medications: I have reviewed the patient's current medications.  Results for orders placed or performed during the hospital encounter of 10/10/15 (from the past 48 hour(s))  CBC with  Differential     Status: Abnormal   Collection Time: 10/10/15  4:04 AM  Result Value Ref Range   WBC 10.2 4.0 - 10.5 K/uL   RBC 3.74 (L) 3.87 - 5.11 MIL/uL   Hemoglobin 12.0 12.0 - 15.0 g/dL   HCT 38.5 36.0 - 46.0 %   MCV 102.9 (H) 78.0 - 100.0 fL   MCH 32.1 26.0 - 34.0 pg   MCHC 31.2 30.0 - 36.0 g/dL   RDW 16.1 (H) 11.5 - 15.5 %   Platelets 215 150 - 400 K/uL   Neutrophils Relative % 69 %   Neutro Abs 7.1 1.7 - 7.7 K/uL   Lymphocytes Relative 24 %   Lymphs Abs 2.4 0.7 - 4.0 K/uL   Monocytes Relative 5 %   Monocytes Absolute 0.5  0.1 - 1.0 K/uL   Eosinophils Relative 2 %   Eosinophils Absolute 0.2 0.0 - 0.7 K/uL   Basophils Relative 0 %   Basophils Absolute 0.0 0.0 - 0.1 K/uL  Basic metabolic panel     Status: Abnormal   Collection Time: 10/10/15  4:04 AM  Result Value Ref Range   Sodium 135 135 - 145 mmol/L   Potassium 3.5 3.5 - 5.1 mmol/L   Chloride 99 (L) 101 - 111 mmol/L   CO2 25 22 - 32 mmol/L   Glucose, Bld 357 (H) 65 - 99 mg/dL   BUN 51 (H) 6 - 20 mg/dL   Creatinine, Ser 2.50 (H) 0.44 - 1.00 mg/dL   Calcium 9.3 8.9 - 10.3 mg/dL   GFR calc non Af Amer 18 (L) >60 mL/min   GFR calc Af Amer 21 (L) >60 mL/min    Comment: (NOTE) The eGFR has been calculated using the CKD EPI equation. This calculation has not been validated in all clinical situations. eGFR's persistently <60 mL/min signify possible Chronic Kidney Disease.    Anion gap 11 5 - 15  Troponin I     Status: Abnormal   Collection Time: 10/10/15  4:04 AM  Result Value Ref Range   Troponin I 0.06 (HH) <0.03 ng/mL    Comment: CRITICAL RESULT CALLED TO, READ BACK BY AND VERIFIED WITH: WILSON,S AT 6:00AM ON 10/10/15 BY FESTERMAN,C   Brain natriuretic peptide     Status: Abnormal   Collection Time: 10/10/15  4:04 AM  Result Value Ref Range   B Natriuretic Peptide 335.0 (H) 0.0 - 100.0 pg/mL  Blood gas, arterial     Status: Abnormal   Collection Time: 10/10/15  4:10 AM  Result Value Ref Range   FIO2 100.00    Delivery systems BILEVEL POSITIVE AIRWAY PRESSURE    Mode OXYGEN MASK    LHR 8 resp/min   Inspiratory PAP 14    Expiratory PAP 6.0    pH, Arterial 7.324 (L) 7.350 - 7.450   pCO2 arterial 53.5 (H) 35.0 - 45.0 mmHg   pO2, Arterial 98.6 80.0 - 100.0 mmHg   Bicarbonate 25.1 (H) 20.0 - 24.0 mEq/L   Acid-Base Excess 1.6 0.0 - 2.0 mmol/L   O2 Saturation 96.2 %   Collection site BRACHIAL ARTERY    Drawn by [id number]    Sample type ARTERIAL    Allens test (pass/fail) NOT INDICATED (A) PASS  Glucose, capillary     Status: Abnormal    Collection Time: 10/10/15  7:55 AM  Result Value Ref Range   Glucose-Capillary 366 (H) 65 - 99 mg/dL    Dg Chest Port 1 View  Result Date: 10/10/2015 CLINICAL  DATA:  70 year old female with shortness of breath EXAM: PORTABLE CHEST 1 VIEW COMPARISON:  Chest radiograph dated 09/28/2015 FINDINGS: There is shallow inspiration of the lungs. Bilateral scratch the diffuse bilateral airspace hazy density and interstitial prominence. Findings may represent pulmonary edema, ARDS, or pneumonia. Clinical correlation is recommended. There is silhouetting of the diaphragm as well as silhouetting of the cardiac borders. No significant pleural effusion. No pneumothorax. No acute osseous pathology. IMPRESSION: Diffuse hazy airspace density predominantly involving the mid to lower lung fields with interval progression compared to prior study. Findings likely represent pulmonary edema, ARDS, or pneumonia. Clinical correlation and follow-up recommended. Electronically Signed   By: Anner Crete M.D.   On: 10/10/2015 04:49    Review of Systems  Constitutional: Negative for chills and malaise/fatigue.  Respiratory: Positive for shortness of breath and wheezing. Negative for cough.   Cardiovascular: Positive for orthopnea, leg swelling and PND. Negative for chest pain.  Gastrointestinal: Negative for diarrhea, nausea and vomiting.  Genitourinary: Negative for dysuria and urgency.  Neurological: Negative for weakness.   Blood pressure 152/65, pulse 66, temperature 97 F (36.1 C), temperature source Axillary, resp. rate 20, height 5' 10" (1.778 m), weight 95.8 kg (211 lb 3.2 oz), SpO2 100 %. Physical Exam  Constitutional: She is oriented to person, place, and time. No distress.  Eyes: Left eye exhibits no discharge.  Neck: No JVD present.  Cardiovascular: Normal rate and regular rhythm.   Murmur heard. Respiratory: No respiratory distress. She has no rales.  GI: She exhibits no distension. There is no  tenderness.  Musculoskeletal: She exhibits edema.  Neurological: She is alert and oriented to person, place, and time.    Assessment/Plan: Problem #1 difficulty in breathing: At this moment most likely secondary to fluid overload. Patient seemed to have recurrent CHF with flash pulmonary edema. Patient has been taking Lasix 20 mg as an outpatient. Presently she is on BiPAP and oxygen saturation is about 100% and she is feeling better. Problem #2 chronic renal failure: Stage IV. Long-standing and thought to be secondary to diabetes/hypertension. Patient presently being followed by [facility]. Patient doesn't have any nausea or vomiting. Her appetite is good and at this moment she doesn't have any sign of fluid overload. Her creatinine seems to be her baseline. Problem #3 history of diabetes Problem #4 hypertension: Her blood pressure seems to be reasonably controlled Problem #5 coronary artery disease: She status post stent placement Problem #6 history of overactive bladder Problem #7 anemia: Her hemoglobin is within normal range. Problem #8 metabolic bone disease: Her calcium is in range. Plan: 1] We'll increase Lasix to 80 mg IV twice a day          2] we'll start her on KCl 40 mEq by mouth once a day as her potassium is low normal at 3.5          3] we'll check a renal panel in the morning          4] since patient has chronic renal failure and has been followed by nephrologist will hold any further workup.  Melo Stauber S 10/10/2015, 8:37 AM

## 2015-10-10 NOTE — ED Notes (Signed)
CRITICAL VALUE ALERT  Critical value received:  Trop 0.06  Date of notification:  10/10/2015  Time of notification:  0605  Critical value read back:Yes.    Nurse who received alert:  Iona Coach  MD notified (1st page):  [phone]   Responding MD:  ward  Time MD responded:  [phone]

## 2015-10-10 NOTE — ED Provider Notes (Signed)
TIME SEEN: 3:40 AM  CHIEF COMPLAINT: Shortness of breath  HPI: Pt is a 70 y.o. female with history of hypertension, hyperlipidemia, diabetes, CHF, chronic kidney disease who presents emergency department with sudden onset shortness of breath that started tonight. No chest pain or chest discomfort. No fevers or cough. No lower extremity swelling or pain. Feels similar to her prior CHF exacerbation. Does not wear oxygen at home.  ROS: See HPI Constitutional: no fever  Eyes: no drainage  ENT: no runny nose   Cardiovascular:  no chest pain  Resp:  SOB  GI: no vomiting GU: no dysuria Integumentary: no rash  Allergy: no hives  Musculoskeletal: no leg swelling  Neurological: no slurred speech ROS otherwise negative  PAST MEDICAL HISTORY/PAST SURGICAL HISTORY:  Past Medical History:  Diagnosis Date  . Acute CHF (congestive heart failure) ([place]) 07/04/2015  . Acute renal failure superimposed on stage 4 chronic kidney disease ([place]) 07/04/2015  . Allergic rhinitis   . Anxiety   . Constipation   . Diabetes mellitus   . GERD (gastroesophageal reflux disease)   . Heart murmur, systolic   . History of hysterectomy   . Hyperlipidemia   . Hypertension   . Low back pain   . Obesity   . Osteoarthritis   . Overactive bladder     MEDICATIONS:  Prior to Admission medications   Medication Sig Start Date End Date Taking? Authorizing Provider  acetaminophen (TYLENOL) 500 MG tablet Take 2 tablets (1,000 mg total) by mouth every 6 (six) hours as needed (pain). 07/12/15   Lily Kocher, MD  ALPRAZolam Duanne Moron) 0.5 MG tablet Take 0.5 mg by mouth at bedtime as needed for sleep.     Historical Provider, MD  amLODipine (NORVASC) 10 MG tablet Take 1 tablet (10 mg total) by mouth daily. 07/17/15   Herminio Commons, MD  aspirin EC 81 MG tablet Take 81 mg by mouth daily.    Historical Provider, MD  benzonatate (TESSALON) 100 MG capsule Take 100 mg by mouth daily as needed for cough.    Historical Provider, MD   calcitRIOL (ROCALTROL) 0.25 MCG capsule Take 0.25 mcg by mouth every Monday, Wednesday, and Friday.  03/23/15   Historical Provider, MD  famotidine (PEPCID) 20 MG tablet Take 20 mg by mouth daily.    Historical Provider, MD  ferrous sulfate 325 (65 FE) MG tablet Take 325 mg by mouth daily.    Historical Provider, MD  gabapentin (NEURONTIN) 100 MG capsule Take 100 mg by mouth daily as needed (pain).     Historical Provider, MD  glucose blood (ACCU-CHEK AVIVA) test strip Use to check blood glucose 4 times a day. E11.65 07/02/15   Cassandria Anger, MD  hydrALAZINE (APRESOLINE) 25 MG tablet Take 1.5 tablets (37.5 mg total) by mouth every 8 (eight) hours. 07/12/15   Lily Kocher, MD  Insulin Degludec (TRESIBA FLEXTOUCH) 100 UNIT/ML SOPN Inject 50 Units into the skin at bedtime. 07/12/15   Lily Kocher, MD  insulin lispro (HUMALOG KWIKPEN) 100 UNIT/ML KiwkPen Inject 0.15-0.21 mLs (15-21 Units total) into the skin 3 (three) times daily. Patient taking differently: Inject 15-21 Units into the skin 3 (three) times daily with meals. Per sliding scale 06/22/15   Cassandria Anger, MD  isosorbide mononitrate (IMDUR) 60 MG 24 hr tablet Take 1 tablet (60 mg total) by mouth daily. 07/12/15   Lily Kocher, MD  latanoprost (XALATAN) 0.005 % ophthalmic solution Place 1 drop into both eyes at bedtime.  10/25/11  Historical Provider, MD  meclizine (ANTIVERT) 12.5 MG tablet Take 12.5 mg by mouth 2 (two) times daily.  11/14/11   Historical Provider, MD  metoprolol (LOPRESSOR) 50 MG tablet Take 1 tablet (50 mg total) by mouth 2 (two) times daily. 07/12/15   Lily Kocher, MD  Multiple Vitamin (MULTIVITAMIN WITH MINERALS) TABS tablet Take 1 tablet by mouth daily.    Historical Provider, MD  nitroGLYCERIN (NITROSTAT) 0.4 MG SL tablet Place 1 tablet (0.4 mg total) under the tongue every 5 (five) minutes as needed for chest pain. 07/12/15   Lily Kocher, MD  rosuvastatin (CRESTOR) 40 MG tablet Take 1 tablet (40 mg total) by mouth at  bedtime. 07/12/15   Lily Kocher, MD  ticagrelor (BRILINTA) 90 MG TABS tablet Take 1 tablet (90 mg total) by mouth 2 (two) times daily. 07/12/15   Lily Kocher, MD  torsemide (DEMADEX) 20 MG tablet Take 20 mg by mouth 2 (two) times daily.  02/10/15   Historical Provider, MD  trimethoprim (TRIMPEX) 100 MG tablet Take 100 mg by mouth daily.  12/06/11   Historical Provider, MD    ALLERGIES:  Allergies  Allergen Reactions  . Ace Inhibitors Cough    Pt can tolerate Tribenzor (and ARBs)  . Codeine Rash    SOCIAL HISTORY:  Social History  Substance Use Topics  . Smoking status: Never Smoker  . Smokeless tobacco: Never Used  . Alcohol use No    FAMILY HISTORY: Family History  Problem Relation Age of Onset  . Diabetes Father   . Hypertension Father   . Stroke Father   . Hypertension Brother   . Aneurysm Brother   . Diabetes Brother   . Colon cancer Neg Hx     EXAM: BP 152/65   Pulse 77   Temp 97 F (36.1 C) (Axillary)   Resp 18   Ht 5\' 10"  (1.778 m)   Wt 211 lb 3.2 oz (95.8 kg)   SpO2 100%   BMI 30.30 kg/m  CONSTITUTIONAL: Alert and oriented and responds appropriately to questions. Well-appearing; well-nourished HEAD: Normocephalic EYES: Conjunctivae clear, PERRL ENT: normal nose; no rhinorrhea; moist mucous membranes NECK: Supple, no meningismus, no LAD  CARD: Regular and tachycardic; S1 and S2 appreciated; no murmurs, no clicks, no rubs, no gallops RESP: Patient is only able to speak 1-2 words at a time, she is tachypneic, hypoxic with sats of 88% on several liters nasal cannula, diffuse expiratory wheezing and bibasilar Rales, increased work of breathing ABD/GI: Normal bowel sounds; non-distended; soft, non-tender, no rebound, no guarding, no peritoneal signs BACK:  The back appears normal and is non-tender to palpation, there is no CVA tenderness EXT: Normal ROM in all joints; non-tender to palpation; no edema; normal capillary refill; no cyanosis, no calf tenderness or  swelling    SKIN: Normal color for age and race; warm; no rash NEURO: Moves all extremities equally, sensation to light touch intact diffusely, cranial nerves II through XII intact   MEDICAL DECISION MAKING: Patient here with sudden onset shortness of breath. Mildly hypertensive. Appears to be volume overloaded. No infectious symptoms recently. No chest pain. We'll give aspirin, Lasix, Nitropaste. EKG shows no new ischemic abnormality. We'll obtain labs, chest x-ray. Anticipate admission. Given patient's respiratory distress, will start on BiPAP. We'll obtain ABG.  ED PROGRESS: Patient appears much more comfortable on BiPAP. BNP is elevated and troponin mildly elevated. Chest x-ray shows pulmonary edema versus less likely pneumonia. I feel this is volume overload especially given sudden onset  in normal blood cell count, no fever, no cough. Patient does have a new oxygen requirement. Will admit to medicine.   Discussed patient's case with hospitalist, Dr. Shanon Brow.  Recommend admission to step down, inpatient bed.  I will place holding orders per their request. Patient and family (if present) updated with plan. Care transferred to hospitalist service.  I reviewed all nursing notes, vitals, pertinent old records, EKGs, labs, imaging (as available).      EKG Interpretation  Date/Time:  Saturday October 10 2015 03:53:55 EDT Ventricular Rate:  92 PR Interval:    QRS Duration: 105 QT Interval:  352 QTC Calculation: 436 R Axis:   -14 Text Interpretation:  Sinus rhythm with 1st degree A-V block Probable left atrial enlargement Low voltage, extremity leads No significant change since last tracing Confirmed by Robson Trickey,  DO, Terryon Pineiro [id number]) on 10/10/2015 4:24:45 AM         CRITICAL CARE Performed by: Nyra Jabs   Total critical care time: 45 minutes  Critical care time was exclusive of separately billable procedures and treating other patients.  Critical care was necessary to treat or  prevent imminent or life-threatening deterioration.  Critical care was time spent personally by me on the following activities: development of treatment plan with patient and/or surrogate as well as nursing, discussions with consultants, evaluation of patient's response to treatment, examination of patient, obtaining history from patient or surrogate, ordering and performing treatments and interventions, ordering and review of laboratory studies, ordering and review of radiographic studies, pulse oximetry and re-evaluation of patient's condition.    [place], DO 10/10/15 [phone]

## 2015-10-10 NOTE — Progress Notes (Addendum)
PROGRESS NOTE  [name] [id number] DOB: [birth year] DOA: 10/10/2015 PCP: Harriett Sine, MD  Brief Narrative: 70 year old woman with coronary artery disease, combined systolic/diastolic congestive heart failure, chronic kidney disease stage IV presented with shortness of breath, respiratory distress, started on BiPAP and admitted for hypercapnic respiratory failure, respiratory acidosis, CHF exacerbation.  Assessment/Plan: 1. Acute hypercapneic resp failure with acute resp acidosis secondary to CHF, currently on BiPAP. CXR wide ddx, pulm edema vs pneumonia vs ARDS. Clinically this appears to be CHF, hemodynamics are stable and oxygenation is adequate. 2. Acute on chronic combined systolic/diastolic CHF complicated by CKD stage IV. LVEF 40% to 45%. Grade 2 diastolic dysfunction. Nephrology recommendations appreciated. 3. Elevated troponin secondary to demand ischemia . Troponin somewhat elevated compared to previous and she does have known coronary disease. However she never had any chest pain and currently is improved. Will monitor clinically at this point. 4. CAD s/p stent 06/2015 5. DM. Poor control while acutely ill. 6. HTN stable. 7. Acute kidney injury superimposed on CKD stage IV   Prognosis guarded but appears improved at this point. Continue BiPAP, hopefully can wean off today and advance diet.  Continue diuresis per nephrology.  Continue to trend troponin. Currently no evidence of ACS. If troponin elevates further, we'll discuss with cardiology. Continue aspirin, ticagrelor, Imdur, will hold beta blocker until compensated.  ADDENDUM Doing well off BiPAP, stable on [place]. Tolerating food. No CP.  Troponin up to 1.62.   Asymptomatic. Favor demand ischemia from CHF. Continue Asa, ticagrelor. If troponin elevates further will consider heparin infusion and discuss with cardiology.  DVT prophylaxis: Heparin Code Status: Full Family Communication: Family at  bedside Disposition Plan: Discharge once improved.  Murray Hodgkins, MD  [facility] Direct contact: [phone] --Via amion app OR  --[email or web address]; password [email or web address]  7PM-7AM contact night coverage as above 10/10/2015, 10:06 AM  LOS: 0 days   Consultants:  Nephrology  Procedures:  None  Antimicrobials:  None  HPI/Subjective: Feels better today, breathing better today. No chest pain. Never had any chest pain.  Objective: Vitals:   10/10/15 0850 10/10/15 0900 10/10/15 0910 10/10/15 0920  BP: 134/64 (!) 76/64 (!) 145/116 (!) 159/70  Pulse: 63 (!) 59 65 64  Resp: 14 14 (!) 22 19  Temp:      TempSrc:      SpO2: 100% 100% 100% 100%  Weight:      Height:        Intake/Output Summary (Last 24 hours) at 10/10/15 1006 Last data filed at 10/10/15 0900  Gross per 24 hour  Intake              120 ml  Output             1500 ml  Net            -1380 ml     Filed Weights   10/10/15 0704  Weight: 95.8 kg (211 lb 3.2 oz)    Exam:    Constitutional:  . Appears calm, comfortable on BiPAP. Eyes:  . PERRL and irises appear normal . Conjunctivae and lids appear normal ENMT:  . external ears, nose appear normal . grossly normal hearing  . Lips appear normal Respiratory:  . CTA bilaterally, no w/r/r. Decreased breath sounds. Marland Kitchen Respiratory effort mildly increased. Speaks in full sentences. No retractions or accessory muscle use Cardiovascular:  . RRR, no m/r/g . 1+ LE extremity edema   . Telemetry SR Abdomen:  . soft, non distended Musculoskeletal:  Moves all extremities Psychiatric:  . judgement and insight appear normal . Mental status o Mood, affect appropriate  I have personally reviewed following labs and imaging studies:  Urine output 1500  BUN 51, Cr 2.50, elevated compared to yesterday.  Troponin 0.06  >> 0.35  CBC unremarkable  Blood sugar elevated  Scheduled Meds: . amLODipine  10 mg Oral Daily  . antiseptic oral rinse  7 mL Mouth  Rinse q12n4p  . aspirin EC  81 mg Oral Daily  . chlorhexidine  15 mL Mouth Rinse BID  . furosemide  80 mg Intravenous Q12H  . heparin  5,000 Units Subcutaneous Q8H  . hydrALAZINE  37.5 mg Oral Q8H  . insulin aspart  0-9 Units Subcutaneous Q4H  . isosorbide mononitrate  60 mg Oral Daily  . potassium chloride  40 mEq Oral Daily  . sodium chloride flush  3 mL Intravenous Q12H  . ticagrelor  90 mg Oral BID   Continuous Infusions:   Principal Problem:   Acute on chronic combined systolic and diastolic CHF (congestive heart failure) (HCC) Active Problems:   Overweight   Essential hypertension   CORONARY ATHEROSCLEROSIS NATIVE CORONARY ARTERY   Type 2 diabetes mellitus with stage 4 chronic kidney disease ([place])   Acute respiratory failure with hypercapnia (H[facility])   LOS: 0 days   Time spent 35 minutes  By signing my name below, I, Hilbert Odor, attest that this documentation has been prepared under the direction and in the presence of Shala Baumbach P. Sarajane Jews, MD. Electronically signed: Hilbert Odor, Scribe.  10/10/15, 9:04 AM    I personally performed the services described in this documentation. All medical record entries made by the scribe were at my direction. I have reviewed the chart and agree that the record reflects my personal performance and is accurate and complete. Murray Hodgkins, MD

## 2015-10-10 NOTE — Progress Notes (Signed)
CRITICAL VALUE ALERT  Critical value received:  Troponin 1.62  Date of notification:  10/10/15  Time of notification:  [id number]  Critical value read back:YES  Nurse who received alert:  Andres Shad RN   MD notified (1st page): Sarajane Jews  Time of first page:  1350  MD notified (2nd page):  Time of second page:  Responding MD:  Sarajane Jews  Time MD responded:  1350

## 2015-10-10 NOTE — ED Triage Notes (Signed)
Pt reports onset of sob approx 1 hour ago.  Pt denies cp

## 2015-10-10 NOTE — ED Notes (Signed)
Report given to [facility], ICU all questions answered.

## 2015-10-10 NOTE — H&P (Signed)
History and Physical    [name] [id number] DOB: 09/25/1945 DOA: 10/10/2015  PCP: Maggie Font, MD  Patient coming from: home  Chief Complaint:   sob  HPI: [name] is a 70 y.o. 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  Pt on arrival was very sob with diffuse rales.  Was placed on bipap and is improving on bipap.  She is mentating normally.  She feels like the bipap is helping her and she just diuresed all over her bed.  She is on lasix 20mg  po daily only.  She denies any fevers or any significant peripheral edema.  She says she was hospitalized at [facility] 2 weeks with this same thing sob and was told it was due to her overactive bladder.  Pt referred for admission for chf exacerbation requiring bipap supportive care.   Review of Systems: As per HPI otherwise 10 point review of systems negative.   Past Medical History:  Diagnosis Date  . Acute CHF (congestive heart failure) ([place]) 07/04/2015  . Acute renal failure superimposed on stage 4 chronic kidney disease ([place]) 07/04/2015  . Allergic rhinitis   . Anxiety   . Constipation   . Diabetes mellitus   . GERD (gastroesophageal reflux disease)   . Heart murmur, systolic   . History of hysterectomy   . Hyperlipidemia   . Hypertension   . Low back pain   . Obesity   . Osteoarthritis   . Overactive bladder     Past Surgical History:  Procedure Laterality Date  . ABDOMINAL HYSTERECTOMY    . BACK SURGERY     multiple  . CARDIAC CATHETERIZATION  04/04/2006   Est EF of 60%  . CARDIAC CATHETERIZATION N/A 07/04/2015   Procedure: Left Heart Cath and Coronary Angiography;  Surgeon: Troy Sine, MD;  Location: [facility];  Service: Cardiovascular;  Laterality: N/A;  . CARDIAC CATHETERIZATION N/A 07/04/2015   Procedure: Coronary Stent Intervention;  Surgeon: Troy Sine, MD;  Location: [facility];  Service: Cardiovascular;  Laterality: N/A;  . CARPAL TUNNEL RELEASE    . CERVICAL BIOPSY     cervical lymph node biopsies  . COLONOSCOPY  May 2002   Dr. Irving Shows :Followup in 5 years, normal exam  . COLONOSCOPY  2008   Dr. Laural Golden: Very redundant colon with mild melanosis coli, splenic flexure polyp biopsy with acute complaint of benign colon polyp. Recommended ten-year followup  . CORONARY STENT PLACEMENT  04/11/2006   2 -- Taxus stents to the circumflex   . PERIPHERAL VASCULAR CATHETERIZATION Right 07/09/2015   Procedure: Upper Extremity Angiography;  Surgeon: Conrad Hudson, MD;  Location: [facility];  Service: Cardiovascular;  Laterality: Right;  . PERIPHERAL VASCULAR CATHETERIZATION Right 07/10/2015   Procedure: RIGHT SUBCLAVIAN ARTERY THROMBECTOMY;  Surgeon: Serafina Mitchell, MD;  Location: [facility];  Service: Vascular;  Laterality: Right;  . tendonitis     bilateral elbow  . TRIGGER FINGER RELEASE       reports that she has never smoked. She has never used smokeless tobacco. She reports that she does not drink alcohol or use drugs.  Allergies  Allergen Reactions  . Ace Inhibitors Cough    Pt can tolerate Tribenzor (and ARBs)  . Codeine Rash  Family History  Problem Relation Age of Onset  . Diabetes Father   . Hypertension Father   . Stroke Father   . Hypertension Brother   . Aneurysm Brother   . Diabetes Brother   . Colon cancer Neg Hx     Prior to Admission medications   Medication Sig Start Date End Date Taking? Authorizing Provider  acetaminophen (TYLENOL) 500 MG tablet Take 2 tablets (1,000 mg total) by mouth every 6 (six) hours as needed (pain). 07/12/15   Lily Kocher, MD  ALPRAZolam Duanne Moron) 0.5 MG tablet Take 0.5 mg by mouth at bedtime as needed for sleep.     Historical Provider, MD  amLODipine (NORVASC) 10 MG tablet Take 1 tablet (10 mg total) by mouth  daily. 07/17/15   Herminio Commons, MD  aspirin EC 81 MG tablet Take 81 mg by mouth daily.    Historical Provider, MD  benzonatate (TESSALON) 100 MG capsule Take 100 mg by mouth daily as needed for cough.    Historical Provider, MD  calcitRIOL (ROCALTROL) 0.25 MCG capsule Take 0.25 mcg by mouth every Monday, Wednesday, and Friday.  03/23/15   Historical Provider, MD  famotidine (PEPCID) 20 MG tablet Take 20 mg by mouth daily.    Historical Provider, MD  ferrous sulfate 325 (65 FE) MG tablet Take 325 mg by mouth daily.    Historical Provider, MD  gabapentin (NEURONTIN) 100 MG capsule Take 100 mg by mouth daily as needed (pain).     Historical Provider, MD  glucose blood (ACCU-CHEK AVIVA) test strip Use to check blood glucose 4 times a day. E11.65 07/02/15   Cassandria Anger, MD  hydrALAZINE (APRESOLINE) 25 MG tablet Take 1.5 tablets (37.5 mg total) by mouth every 8 (eight) hours. 07/12/15   Lily Kocher, MD  Insulin Degludec (TRESIBA FLEXTOUCH) 100 UNIT/ML SOPN Inject 50 Units into the skin at bedtime. 07/12/15   Lily Kocher, MD  insulin lispro (HUMALOG KWIKPEN) 100 UNIT/ML KiwkPen Inject 0.15-0.21 mLs (15-21 Units total) into the skin 3 (three) times daily. Patient taking differently: Inject 15-21 Units into the skin 3 (three) times daily with meals. Per sliding scale 06/22/15   Cassandria Anger, MD  isosorbide mononitrate (IMDUR) 60 MG 24 hr tablet Take 1 tablet (60 mg total) by mouth daily. 07/12/15   Lily Kocher, MD  latanoprost (XALATAN) 0.005 % ophthalmic solution Place 1 drop into both eyes at bedtime.  10/25/11   Historical Provider, MD  meclizine (ANTIVERT) 12.5 MG tablet Take 12.5 mg by mouth 2 (two) times daily.  11/14/11   Historical Provider, MD  metoprolol (LOPRESSOR) 50 MG tablet Take 1 tablet (50 mg total) by mouth 2 (two) times daily. 07/12/15   Lily Kocher, MD  Multiple Vitamin (MULTIVITAMIN WITH MINERALS) TABS tablet Take 1 tablet by mouth daily.    Historical Provider, MD    nitroGLYCERIN (NITROSTAT) 0.4 MG SL tablet Place 1 tablet (0.4 mg total) under the tongue every 5 (five) minutes as needed for chest pain. 07/12/15   Lily Kocher, MD  rosuvastatin (CRESTOR) 40 MG tablet Take 1 tablet (40 mg total) by mouth at bedtime. 07/12/15   Lily Kocher, MD  ticagrelor (BRILINTA) 90 MG TABS tablet Take 1 tablet (90 mg total) by mouth 2 (two) times daily. 07/12/15   Lily Kocher, MD  torsemide (DEMADEX) 20 MG tablet Take 20 mg by mouth 2 (two) times daily.  02/10/15   Historical Provider, MD  trimethoprim (TRIMPEX) 100 MG tablet Take 100 mg by  mouth daily.  12/06/11   Historical Provider, MD    Physical Exam: Vitals:   10/10/15 0430 10/10/15 0445 10/10/15 0500 10/10/15 0516  BP: 163/83 161/79 156/74   Pulse: 78 79 73 72  Resp: 26 (!) 28 (!) 28 (!) 27  Temp:      TempSrc:      SpO2: 100% 100% 100% 100%      Constitutional: NAD, calm, comfortable Vitals:   10/10/15 0430 10/10/15 0445 10/10/15 0500 10/10/15 0516  BP: 163/83 161/79 156/74   Pulse: 78 79 73 72  Resp: 26 (!) 28 (!) 28 (!) 27  Temp:      TempSrc:      SpO2: 100% 100% 100% 100%   Eyes: PERRL, lids and conjunctivae normal, comfortable on bipap ENMT: Mucous membranes are moist. Posterior pharynx clear of any exudate or lesions.Normal dentition.  Neck: normal, supple, no masses, no thyromegaly Respiratory: diffuse rales bilaterally no wheezing, no crackles. Normal respiratory effort. No accessory muscle use.  Cardiovascular: Regular rate and rhythm, no murmurs / rubs / gallops. No extremity edema. 2+ pedal pulses. No carotid bruits.  Abdomen: no tenderness, no masses palpated. No hepatosplenomegaly. Bowel sounds positive.  Musculoskeletal: no clubbing / cyanosis. No joint deformity upper and lower extremities. Good ROM, no contractures. Normal muscle tone.  Skin: no rashes, lesions, ulcers. No induration Neurologic: CN 2-12 grossly intact. Sensation intact, DTR normal. Strength 5/5 in all 4.  Psychiatric:  Normal judgment and insight. Alert and oriented x 3. Normal mood.    Labs on Admission: I have personally reviewed following labs and imaging studies  CBC:  Recent Labs Lab 10/10/15 0404  WBC 10.2  NEUTROABS 7.1  HGB 12.0  HCT 38.5  MCV 102.9*  PLT 123456   Basic Metabolic Panel:  Recent Labs Lab 10/10/15 0404  NA 135  K 3.5  CL 99*  CO2 25  GLUCOSE 357*  BUN 51*  CREATININE 2.50*  CALCIUM 9.3   GFR: CrCl cannot be calculated (Unknown ideal weight.).  Urine analysis:    Component Value Date/Time   COLORURINE YELLOW 08/23/2007 2037   APPEARANCEUR CLEAR 08/23/2007 2037   LABSPEC <1.005 (L) 08/23/2007 2037   PHURINE 5.5 08/23/2007 2037   GLUCOSEU >1000 (A) 08/23/2007 2037   HGBUR TRACE (A) 08/23/2007 2037   BILIRUBINUR NEGATIVE 08/23/2007 2037   KETONESUR TRACE (A) 08/23/2007 2037   PROTEINUR TRACE (A) 08/23/2007 2037   UROBILINOGEN 0.2 08/23/2007 2037   NITRITE NEGATIVE 08/23/2007 2037   LEUKOCYTESUR NEGATIVE 08/23/2007 2037    Radiological Exams on Admission: Dg Chest Port 1 View  Result Date: 10/10/2015 CLINICAL DATA:  70 year old female with shortness of breath EXAM: PORTABLE CHEST 1 VIEW COMPARISON:  Chest radiograph dated 09/28/2015 FINDINGS: There is shallow inspiration of the lungs. Bilateral scratch the diffuse bilateral airspace hazy density and interstitial prominence. Findings may represent pulmonary edema, ARDS, or pneumonia. Clinical correlation is recommended. There is silhouetting of the diaphragm as well as silhouetting of the cardiac borders. No significant pleural effusion. No pneumothorax. No acute osseous pathology. IMPRESSION: Diffuse hazy airspace density predominantly involving the mid to lower lung fields with interval progression compared to prior study. Findings likely represent pulmonary edema, ARDS, or pneumonia. Clinical correlation and follow-up recommended. Electronically Signed   By: Anner Crete M.D.   On: 10/10/2015 04:49     EKG: Independently reviewed. nsr 1degree AVB  Assessment/Plan Principal Problem: 70 yo female with acute respiratory failure secondary to CHF systolic and diastolic dysfunction improving on  bipap    Acute on chronic combined systolic and diastolic CHF (congestive heart failure) ([place])- place on chf pathway.  Will place on lasix 40mg  iv q 12 hours.  Place foley cath.  Cont bipap in stepdown unit.  Seems to be responding with diuresis already.   Serial troponin levels.  Place on aspirin.  ntg has been placed by ED will continue this.  Due to her advanced renal disease will also get nephrology involved in case she does not respond well to increasing her diuretics.  Active Problems:   Acute respiratory failure ([place])- secondary to chf, improving on bipap.  Cont this through the day and wean as tolerates.   Overweight- noted   Essential hypertension   CORONARY ATHEROSCLEROSIS NATIVE CORONARY ARTERY   Type 2 diabetes mellitus with stage 4 chronic kidney disease ([place])- ssi   CKD stage 4- also obtain nephrology consult   Admit to stepdown.    DVT prophylaxis:   Heparin subq, scds Code Status:   Full code.  DAVID,RACHAL A MD [facility]  If 7PM-7AM, please contact night-coverage [email or web address] Password [email or web address]  10/10/2015, 5:55 AM

## 2015-10-10 NOTE — Progress Notes (Signed)
BEFEKAD[facility], [facility] - notified via page, and verbally over the phone that there was a Nephrology consult in place for this patient.

## 2015-10-11 DIAGNOSIS — E1122 Type 2 diabetes mellitus with diabetic chronic kidney disease: Secondary | ICD-10-CM

## 2015-10-11 DIAGNOSIS — Z794 Long term (current) use of insulin: Secondary | ICD-10-CM

## 2015-10-11 DIAGNOSIS — N184 Chronic kidney disease, stage 4 (severe): Secondary | ICD-10-CM

## 2015-10-11 DIAGNOSIS — I248 Other forms of acute ischemic heart disease: Secondary | ICD-10-CM

## 2015-10-11 LAB — RENAL FUNCTION PANEL
ALBUMIN: 3.5 g/dL (ref 3.5–5.0)
ANION GAP: 7 (ref 5–15)
BUN: 38 mg/dL — ABNORMAL HIGH (ref 6–20)
CALCIUM: 8.6 mg/dL — AB (ref 8.9–10.3)
CO2: 28 mmol/L (ref 22–32)
Chloride: 105 mmol/L (ref 101–111)
Creatinine, Ser: 1.66 mg/dL — ABNORMAL HIGH (ref 0.44–1.00)
GFR, EST AFRICAN AMERICAN: 35 mL/min — AB (ref >60)
GFR, EST NON AFRICAN AMERICAN: 30 mL/min — AB (ref >60)
Glucose, Bld: 61 mg/dL — ABNORMAL LOW (ref 65–99)
PHOSPHORUS: 2.7 mg/dL (ref 2.5–4.6)
Potassium: 3.7 mmol/L (ref 3.5–5.1)
Sodium: 140 mmol/L (ref 135–145)

## 2015-10-11 LAB — GLUCOSE, CAPILLARY
GLUCOSE-CAPILLARY: 80 mg/dL (ref 65–99)
GLUCOSE-CAPILLARY: 360 mg/dL — AB (ref 65–99)
Glucose-Capillary: 87 mg/dL (ref 65–99)
Glucose-Capillary: 475 mg/dL — ABNORMAL HIGH (ref 65–99)
Glucose-Capillary: 483 mg/dL — ABNORMAL HIGH (ref 65–99)

## 2015-10-11 LAB — TROPONIN I: TROPONIN I: 1.22 ng/mL — AB (ref <0.03)

## 2015-10-11 MED ORDER — INSULIN DEGLUDEC 100 UNIT/ML ~~LOC~~ SOPN: 25.0000 [IU] | PEN_INJECTOR | Freq: Every day | SUBCUTANEOUS | Status: DC

## 2015-10-11 MED ORDER — METOPROLOL TARTRATE 50 MG PO TABS
50.0000 mg | ORAL_TABLET | Freq: Two times a day (BID) | ORAL | Status: DC
  Administered 2015-10-11 – 2015-10-13 (×5): 50 mg via ORAL
  Filled 2015-10-11 (×5): qty 1

## 2015-10-11 MED ORDER — INSULIN ASPART 100 UNIT/ML ~~LOC~~ SOLN
0.0000 [IU] | Freq: Every day | SUBCUTANEOUS | Status: DC
  Administered 2015-10-12: 3 [IU] via SUBCUTANEOUS

## 2015-10-11 MED ORDER — INSULIN ASPART 100 UNIT/ML ~~LOC~~ SOLN
6.0000 [IU] | Freq: Once | SUBCUTANEOUS | Status: AC
  Administered 2015-10-11: 6 [IU] via SUBCUTANEOUS

## 2015-10-11 MED ORDER — TORSEMIDE 20 MG PO TABS
40.0000 mg | ORAL_TABLET | Freq: Every day | ORAL | Status: DC
  Administered 2015-10-11: 40 mg via ORAL
  Filled 2015-10-11 (×2): qty 2

## 2015-10-11 MED ORDER — INSULIN GLARGINE 100 UNIT/ML ~~LOC~~ SOLN
25.0000 [IU] | Freq: Every day | SUBCUTANEOUS | Status: DC
  Administered 2015-10-11: 25 [IU] via SUBCUTANEOUS
  Filled 2015-10-11 (×2): qty 0.25

## 2015-10-11 MED ORDER — INSULIN ASPART 100 UNIT/ML ~~LOC~~ SOLN
0.0000 [IU] | Freq: Three times a day (TID) | SUBCUTANEOUS | Status: DC
  Administered 2015-10-11: 15 [IU] via SUBCUTANEOUS
  Administered 2015-10-12 (×2): 11 [IU] via SUBCUTANEOUS
  Administered 2015-10-12: 8 [IU] via SUBCUTANEOUS
  Administered 2015-10-13: 3 [IU] via SUBCUTANEOUS
  Administered 2015-10-14: 08:00:00 5 [IU] via SUBCUTANEOUS
  Administered 2015-10-14: 11 [IU] via SUBCUTANEOUS

## 2015-10-11 NOTE — Progress Notes (Signed)
Subjective: Interval History: has no complaint of nausea or vomiting. Her breathing is much better. She has occasional cough. She denies any orthopnea or PND. Presently she is on oxygen.  Objective: Vital signs in last 24 hours: Temp:  [97.2 F (36.2 C)-99.3 F (37.4 C)] 99.2 F (37.3 C) (08/06 0739) Pulse Rate:  [56-80] 69 (08/06 0800) Resp:  [10-25] 15 (08/06 0800) BP: (76-171)/(48-116) 144/54 (08/06 0800) SpO2:  [97 %-100 %] 98 % (08/06 0800) Weight:  [95 kg (209 lb 7 oz)] 95 kg (209 lb 7 oz) (08/06 0500) Weight change:   Intake/Output from previous day: 08/05 0701 - 08/06 0700 In: 840 [P.O.:840] Out: 4500 [Urine:4500] Intake/Output this shift: No intake/output data recorded.  General appearance: alert, cooperative and no distress Resp: diminished breath sounds bilaterally Cardio: regular rate and rhythm GI: soft, non-tender; bowel sounds normal; no masses,  no organomegaly Extremities: edema Trace 1+ edema  Lab Results:  Recent Labs  10/10/15 0404  WBC 10.2  HGB 12.0  HCT 38.5  PLT 215   BMET:  Recent Labs  10/10/15 0404 10/11/15 0451  NA 135 140  K 3.5 3.7  CL 99* 105  CO2 25 28  GLUCOSE 357* 61*  BUN 51* 38*  CREATININE 2.50* 1.66*  CALCIUM 9.3 8.6*   No results for input(s): PTH in the last 72 hours. Iron Studies: No results for input(s): IRON, TIBC, TRANSFERRIN, FERRITIN in the last 72 hours.  Studies/Results: Dg Chest Port 1 View  Result Date: 10/10/2015 CLINICAL DATA:  70 year old female with shortness of breath EXAM: PORTABLE CHEST 1 VIEW COMPARISON:  Chest radiograph dated 09/28/2015 FINDINGS: There is shallow inspiration of the lungs. Bilateral scratch the diffuse bilateral airspace hazy density and interstitial prominence. Findings may represent pulmonary edema, ARDS, or pneumonia. Clinical correlation is recommended. There is silhouetting of the diaphragm as well as silhouetting of the cardiac borders. No significant pleural effusion. No  pneumothorax. No acute osseous pathology. IMPRESSION: Diffuse hazy airspace density predominantly involving the mid to lower lung fields with interval progression compared to prior study. Findings likely represent pulmonary edema, ARDS, or pneumonia. Clinical correlation and follow-up recommended. Electronically Signed   By: Anner Crete M.D.   On: 10/10/2015 04:49    I have reviewed the patient's current medications.  Assessment/Plan: Problem #1 acute kidney injury superimposed on chronic. Presently her renal function seems to be improving. Patient does not have any nausea or vomiting. Problem #2 chronic renal failure: Thought to be secondary to diabetes versus hypertension. Recently her creatinine seems to be much better. At this moment her baseline renal function seems to be stage III. Problem #3 difficulty in breathing: Much better. Possibly a combination of pneumonia and pulmonary edema. Patient is on Lasix and she had about 4500 mL of urine output. Patient has improved significantly. Problem #4 hypertension: Her blood pressure is reasonably controlled Problem #5 diabetes Problem #6 metabolic bone disease: Her calcium and phosphorus is in range. Problem #7 anemia: Her hemoglobin is within target goal. Plan: 1] Will DC IV Lasix          2] We will start on Demadex 40 mg by mouth daily          3] we'll check her renal panel in the morning.   LOS: 1 day   Dimitrios Balestrieri S 10/11/2015,8:54 AM

## 2015-10-11 NOTE — Progress Notes (Signed)
PROGRESS NOTE  [name] [id number] DOB: [birth year] DOA: 10/10/2015 PCP: Harriett Sine, MD  Brief Narrative: 70 year old woman with coronary artery disease, combined systolic/diastolic congestive heart failure, chronic kidney disease stage IV presented with shortness of breath, respiratory distress, started on BiPAP and admitted for hypercapnic respiratory failure, respiratory acidosis, CHF exacerbation. EKG SR. ECHO was ordered. Nephrology has evaluated her. Nephrology following.  Assessment/Plan: 1. Acute hypercapneic resp failure with acute resp acidosis secondary to CHF, acute issues resolve. Off BiPAP. On low flow nasal cannula. 2. Acute on chronic combined systolic, diastolic CHF, complicated by CKD stage IV. Excellent diuresis, -3.6 L since admission. LVEF 40-45 percent. Grade 2 diastolic dysfunction. No ACE inhibitor for now given kidney issues. 3. Acute kidney injury superimposed on chronic kidney disease stage IV. Much improved today. Appreciate nephrology management. 4. Demand ischemia secondary to acute CHF. Troponin has peaked and is now trending down.  Check echocardiogram to assess for changes. 5. Coronary artery disease status post stent April 2017 6. Diabetes mellitus type 2, stable.   Overall much improved. Off BiPAP. Respiratory status stable. Troponin now trending down again. Never had chest pain.  Wean oxygen as tolerated. Continue diuresis as per nephrology.  Check troponin in the morning. Continue aspirin, ticagrelor, hydralazine, Imdur, Crestor. Resume beta blocker. Consult cardiology 8/7.  Discontinue foley catheter  Transfer to telemetry  DVT prophylaxis: Heparin Code Status: Full Family Communication: No family at bedside Disposition Plan: Home, likely 81 hours  [name] Hodgkins, MD  [facility] Direct contact: [phone] --[place]  --[email or web address]; password [email or web address]  7PM-7AM contact night coverage as above 10/11/2015, 5:59  AM  LOS: 1 day   Consultants:  Nephrology  Procedures:  ECHO  Antimicrobials:  None  HPI/Subjective: Doing well today. Breathing is better. Slept well. No CP. Eating well.  Objective: Vitals:   10/11/15 0200 10/11/15 0300 10/11/15 0400 10/11/15 0500  BP: 140/66 (!) 154/73 (!) 160/77   Pulse: 64 61 67   Resp: (!) 23 16 (!) 23   Temp:   97.6 F (36.4 C)   TempSrc:   Axillary   SpO2: 100% 100% 100%   Weight:    95 kg (209 lb 7 oz)  Height:        Intake/Output Summary (Last 24 hours) at 10/11/15 0559 Last data filed at 10/10/15 2000  Gross per 24 hour  Intake              840 ml  Output             3500 ml  Net            -2660 ml     Filed Weights   10/10/15 0704 10/11/15 0500  Weight: 95.8 kg (211 lb 3.2 oz) 95 kg (209 lb 7 oz)    Exam:   Constitutional:  . Appears calm and comfortable Eyes:  . PERRL and irises appear normal . Conjunctivae and lids appear normal ENMT:  . external ears, nose appear normal . grossly normal hearing  . Lips appear normal Respiratory:  . CTA bilaterally, no w/r/r.  . Respiratory effort normal. No retractions or accessory muscle use Cardiovascular:  . RRR, no m/r/g . No LE extremity edema   . Telemetry SR Psychiatric:  . judgement and insight appear normal  I have personally reviewed following labs and imaging studies:  BUN 38, Cr 1.66 both Trending down. Potassium normal.  Troponin peaked 1.62 >> 1.58 >> 1.22  Scheduled Meds: . amLODipine  10 mg Oral Daily  . antiseptic oral rinse  7 mL Mouth Rinse q12n4p  . aspirin EC  81 mg Oral Daily  . chlorhexidine  15 mL Mouth Rinse BID  . furosemide  80 mg Intravenous Q12H  . heparin  5,000 Units Subcutaneous Q8H  . hydrALAZINE  37.5 mg Oral Q8H  . insulin aspart  0-9 Units Subcutaneous Q4H  . isosorbide mononitrate  60 mg Oral Daily  . potassium chloride  40 mEq Oral Daily  . sodium chloride flush  3 mL Intravenous Q12H  . ticagrelor  90 mg Oral BID   Continuous  Infusions:   Principal Problem:   Acute on chronic combined systolic and diastolic CHF (congestive heart failure) ([facility]) Active Problems:   Overweight   Essential hypertension   CORONARY ATHEROSCLEROSIS NATIVE CORONARY ARTERY   Type 2 diabetes mellitus with stage 4 chronic kidney disease ([facility])   Acute respiratory failure with hypercapnia ([place])   AKI (acute kidney injury) ([place])   LOS: 1 day   Time spent 25 minutes  By signing my name below, I, [name], attest that this documentation has been prepared under the direction and in the presence of [name] Mcinturff P. Sarajane Jews, MD. Electronically signed: [name], Scribe.  10/11/15, 8:20 AM   I personally performed the services described in this documentation. All medical record entries made by the scribe were at my direction. I have reviewed the chart and agree that the record reflects my personal performance and is accurate and complete. [name] Hodgkins, MD

## 2015-10-11 NOTE — Progress Notes (Signed)
Patient CBG 475. Dr. Sarajane Jews notified.

## 2015-10-12 ENCOUNTER — Ambulatory Visit: Payer: Medicare Other | Admitting: "Endocrinology

## 2015-10-12 DIAGNOSIS — I5043 Acute on chronic combined systolic (congestive) and diastolic (congestive) heart failure: Secondary | ICD-10-CM

## 2015-10-12 DIAGNOSIS — I214 Non-ST elevation (NSTEMI) myocardial infarction: Secondary | ICD-10-CM

## 2015-10-12 LAB — BASIC METABOLIC PANEL
Anion gap: 10 (ref 5–15)
BUN: 43 mg/dL — AB (ref 6–20)
CHLORIDE: 101 mmol/L (ref 101–111)
CO2: 28 mmol/L (ref 22–32)
Calcium: 8.9 mg/dL (ref 8.9–10.3)
Creatinine, Ser: 1.83 mg/dL — ABNORMAL HIGH (ref 0.44–1.00)
GFR calc Af Amer: 31 mL/min — ABNORMAL LOW (ref >60)
GFR calc non Af Amer: 27 mL/min — ABNORMAL LOW (ref >60)
GLUCOSE: 251 mg/dL — AB (ref 65–99)
POTASSIUM: 3.7 mmol/L (ref 3.5–5.1)
Sodium: 139 mmol/L (ref 135–145)

## 2015-10-12 LAB — GLUCOSE, CAPILLARY
GLUCOSE-CAPILLARY: 287 mg/dL — AB (ref 65–99)
GLUCOSE-CAPILLARY: 269 mg/dL — AB (ref 65–99)
GLUCOSE-CAPILLARY: 310 mg/dL — AB (ref 65–99)
Glucose-Capillary: 262 mg/dL — ABNORMAL HIGH (ref 65–99)
Glucose-Capillary: 320 mg/dL — ABNORMAL HIGH (ref 65–99)
Glucose-Capillary: 368 mg/dL — ABNORMAL HIGH (ref 65–99)

## 2015-10-12 LAB — RENAL FUNCTION PANEL
ANION GAP: 10 (ref 5–15)
Albumin: 3.5 g/dL (ref 3.5–5.0)
BUN: 43 mg/dL — ABNORMAL HIGH (ref 6–20)
CHLORIDE: 100 mmol/L — AB (ref 101–111)
CO2: 28 mmol/L (ref 22–32)
Calcium: 8.9 mg/dL (ref 8.9–10.3)
Creatinine, Ser: 1.82 mg/dL — ABNORMAL HIGH (ref 0.44–1.00)
GFR, EST AFRICAN AMERICAN: 31 mL/min — AB (ref >60)
GFR, EST NON AFRICAN AMERICAN: 27 mL/min — AB (ref >60)
GLUCOSE: 250 mg/dL — AB (ref 65–99)
PHOSPHORUS: 2.6 mg/dL (ref 2.5–4.6)
POTASSIUM: 3.7 mmol/L (ref 3.5–5.1)
Sodium: 138 mmol/L (ref 135–145)

## 2015-10-12 LAB — TROPONIN I: Troponin I: 0.54 ng/mL (ref <0.03)

## 2015-10-12 MED ORDER — TORSEMIDE 20 MG PO TABS
60.0000 mg | ORAL_TABLET | Freq: Every day | ORAL | Status: DC
  Administered 2015-10-12 – 2015-10-14 (×3): 60 mg via ORAL
  Filled 2015-10-12 (×3): qty 3

## 2015-10-12 MED ORDER — INSULIN GLARGINE 100 UNIT/ML ~~LOC~~ SOLN
50.0000 [IU] | Freq: Every day | SUBCUTANEOUS | Status: DC
  Administered 2015-10-12: 50 [IU] via SUBCUTANEOUS
  Filled 2015-10-12 (×3): qty 0.5

## 2015-10-12 NOTE — Consult Note (Signed)
Primary cardiologist: Dr Kate Sable Consulting cardiologist: Dr Carlyle Dolly Requesting physician: Dr Murray Hodgkins Indication: SOB  Clinical Summary Ms. Dralle is a 70 y.o.female history of CAD with NSTEMI 06/2015 with DES to PDA and RCA at that time. She had a subtotal occlusion of LCX that was treated medically, with recs if viability shown could consider intervention. Chronic systolic HF LVEF [id number], grade II diastolic dysfunction, right subclavian stenosis s/p intervention, DM2, HTN, HL, CKD IV admitted with SOB. Initially required bipap in ER.   She reports she had been feeling well for the last several days. Had not had any SOB/DOE, LE edema, and home weights had been stable around 208 lbs. Suddenly on the morning of admission she awoke with sudden SOB and cough. Denied any chest pain. She reports a similar episode about 2-3 weeks ago requriring admission at [facility].   Admit labs WBC 10.2, Hgb 12, Plt 215, K 3.5, Cr 2.5 (baseline 1.8), BNP 335 (down from 663),  ABG 7.3/53/98 Trop peak 1.62, trending down.  CXR probable edema EKG SR, nonspecific ST/T changes 07/2015 echo: LVEF 40-45%, multiple WMAs, grade II diastolic dysfunction.     Allergies  Allergen Reactions  . Ace Inhibitors Cough    Pt can tolerate Tribenzor (and ARBs)  . Codeine Rash    Medications Scheduled Medications: . amLODipine  10 mg Oral Daily  . antiseptic oral rinse  7 mL Mouth Rinse q12n4p  . aspirin EC  81 mg Oral Daily  . chlorhexidine  15 mL Mouth Rinse BID  . heparin  5,000 Units Subcutaneous Q8H  . hydrALAZINE  37.5 mg Oral Q8H  . insulin aspart  0-15 Units Subcutaneous TID WC  . insulin aspart  0-5 Units Subcutaneous QHS  . insulin glargine  25 Units Subcutaneous QHS  . isosorbide mononitrate  60 mg Oral Daily  . metoprolol  50 mg Oral BID  . potassium chloride  40 mEq Oral Daily  . sodium chloride flush  3 mL Intravenous Q12H  . ticagrelor  90 mg Oral BID  . torsemide  60 mg  Oral Daily     Infusions:     PRN Medications:  sodium chloride, acetaminophen, ALPRAZolam, benzonatate, ondansetron (ZOFRAN) IV, sodium chloride flush   Past Medical History:  Diagnosis Date  . Acute CHF (congestive heart failure) (Echo) 07/04/2015  . Acute renal failure superimposed on stage 4 chronic kidney disease ([place]) 07/04/2015  . Allergic rhinitis   . Anxiety   . Constipation   . Diabetes mellitus   . GERD (gastroesophageal reflux disease)   . Heart murmur, systolic   . History of hysterectomy   . Hyperlipidemia   . Hypertension   . Low back pain   . Obesity   . Osteoarthritis   . Overactive bladder     Past Surgical History:  Procedure Laterality Date  . ABDOMINAL HYSTERECTOMY    . BACK SURGERY     multiple  . CARDIAC CATHETERIZATION  04/04/2006   Est EF of 60%  . CARDIAC CATHETERIZATION N/A 07/04/2015   Procedure: Left Heart Cath and Coronary Angiography;  Surgeon: Troy Sine, MD;  Location: [place] [facility];  Service: Cardiovascular;  Laterality: N/A;  . CARDIAC CATHETERIZATION N/A 07/04/2015   Procedure: Coronary Stent Intervention;  Surgeon: Troy Sine, MD;  Location: [place] [facility];  Service: Cardiovascular;  Laterality: N/A;  . CARPAL TUNNEL RELEASE    . CERVICAL BIOPSY     cervical lymph node biopsies  .  COLONOSCOPY  May 2002   Dr. Irving Shows :Followup in 5 years, normal exam  . COLONOSCOPY  2008   Dr. Laural Golden: Very redundant colon with mild melanosis coli, splenic flexure polyp biopsy with acute complaint of benign colon polyp. Recommended ten-year followup  . CORONARY STENT PLACEMENT  04/11/2006   2 -- Taxus stents to the circumflex   . PERIPHERAL VASCULAR CATHETERIZATION Right 07/09/2015   Procedure: Upper Extremity Angiography;  Surgeon: Conrad Montura, MD;  Location: [place] [facility];  Service: Cardiovascular;  Laterality: Right;  . PERIPHERAL VASCULAR CATHETERIZATION Right 07/10/2015   Procedure: RIGHT SUBCLAVIAN ARTERY THROMBECTOMY;   Surgeon: Serafina Mitchell, MD;  Location: [facility];  Service: Vascular;  Laterality: Right;  . tendonitis     bilateral elbow  . TRIGGER FINGER RELEASE      Family History  Problem Relation Age of Onset  . Diabetes Father   . Hypertension Father   . Stroke Father   . Hypertension Brother   . Aneurysm Brother   . Diabetes Brother   . Colon cancer Neg Hx     Social History Ms. Porcaro reports that she has never smoked. She has never used smokeless tobacco. Ms. Piechowski reports that she does not drink alcohol.  Review of Systems CONSTITUTIONAL: No weight loss, fever, chills, weakness or fatigue.  HEENT: Eyes: No visual loss, blurred vision, double vision or yellow sclerae. No hearing loss, sneezing, congestion, runny nose or sore throat.  SKIN: No rash or itching.  CARDIOVASCULAR: per HPI RESPIRATORY: per HPI GASTROINTESTINAL: No anorexia, nausea, vomiting or diarrhea. No abdominal pain or blood.  GENITOURINARY: no polyuria, no dysuria NEUROLOGICAL: No headache, dizziness, syncope, paralysis, ataxia, numbness or tingling in the extremities. No change in bowel or bladder control.  MUSCULOSKELETAL: No muscle, back pain, joint pain or stiffness.  HEMATOLOGIC: No anemia, bleeding or bruising.  LYMPHATICS: No enlarged nodes. No history of splenectomy.  PSYCHIATRIC: No history of depression or anxiety.      Physical Examination Blood pressure (!) 122/51, pulse (!) 56, temperature 98.7 F (37.1 C), temperature source Oral, resp. rate 14, height 5\' 10"  (1.778 m), weight 201 lb 11.2 oz (91.5 kg), SpO2 98 %.  Intake/Output Summary (Last 24 hours) at 10/12/15 0958 Last data filed at 10/12/15 0900  Gross per 24 hour  Intake              240 ml  Output              400 ml  Net             -160 ml    HEENT: sclera clear, throat clear  Cardiovascular: RRR, no m/r/g,n  No jvd  Respiratory: CTAB  GI: abdomen soft, NT, ND  MSK: no Le edema  Neuro: no focal deficits  Psych:  appropriate affect   Lab Results  Basic Metabolic Panel:  Recent Labs Lab 10/10/15 0404 10/11/15 0451 10/12/15 0621 10/12/15 0623  NA 135 140 139 138  K 3.5 3.7 3.7 3.7  CL 99* 105 101 100*  CO2 25 28 28 28   GLUCOSE 357* 61* 251* 250*  BUN 51* 38* 43* 43*  CREATININE 2.50* 1.66* 1.83* 1.82*  CALCIUM 9.3 8.6* 8.9 8.9  PHOS  --  2.7  --  2.6    Liver Function Tests:  Recent Labs Lab 10/11/15 0451 10/12/15 0623  ALBUMIN 3.5 3.5    CBC:  Recent Labs Lab 10/10/15 0404  WBC 10.2  NEUTROABS 7.1  HGB 12.0  HCT 38.5  MCV 102.9*  PLT 215    Cardiac Enzymes:  Recent Labs Lab 10/10/15 0754 10/10/15 1226 10/10/15 1946 10/11/15 0451 10/12/15 0621  TROPONINI 0.35* 1.62* 1.58* 1.22* 0.54*    BNP: Invalid input(s): POCBNP    Impression/Recommendations  1. 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  - no current symptoms, she is back on oral diuretics per renal   2. NSTEMI - peak trop 1.6, now trending down. Repeat echo pending - history indicates 2 recent episodes of acute pulmonary edema, given the acuity and magnitude of her troponin elevation my concern would be her pulmonary edema is ischemic driven, either a potential issue with her prior stented arteries or potentially ischemia related to her LCX subtotal occlusion. - I have recommended repeat cath  to evaluate. I think if her previous stented arteries look good, her LCX could be a culprit and will need to be considered for possible intervention if thought amenable from anatomical standpoint.  Risks given her renal function were discussed, she wishes to think about at this time and readdress later in the day.  - continue current meds   Carlyle Dolly, M.D.

## 2015-10-12 NOTE — Progress Notes (Signed)
Inpatient Diabetes Program Recommendations  AACE/ADA: New Consensus Statement on Inpatient Glycemic Control (2015)  Target Ranges:  Prepandial:   less than 140 mg/dL      Peak postprandial:   less than 180 mg/dL (1-2 hours)      Critically ill patients:  140 - 180 mg/dL   Results for RANDINE, ASKEY (MRN [id number]) as of 10/12/2015 08:13  Ref. Range 10/11/2015 07:38 10/11/2015 11:40 10/11/2015 15:50 10/11/2015 21:10 10/12/2015 00:59 10/12/2015 04:40  Glucose-Capillary Latest Ref Range: 65 - 99 mg/dL 87 360 (H) 475 (H) 483 (H) 368 (H) 287 (H)   Review of Glycemic Control  Diabetes history: DM2 Outpatient Diabetes medications: Tresiba 50 units QHS, Humalog 15-21 units TID Current orders for Inpatient glycemic control: Lantus 25 units QHS, Novolog 0-15 units TID with meals, Novolog 0-5 units QHS  Inpatient Diabetes Program Recommendations: Insulin - Basal: Please consider increasing Lantus to 35 units QHS (based on 91.5 kg x 0.4 units). Insulin - Meal Coverage: If patient is eating at least 50% of meals, please consider ordering Novolog 5 units TID with meals for meal coverage.  Thanks, Barnie Alderman, RN, MSN, CDE Diabetes Coordinator Inpatient Diabetes Program [phone] (Team Pager from [place] to [place]) [phone] ([facility] office) [phone] [facility] office) [phone] [facility] office)

## 2015-10-12 NOTE — Progress Notes (Signed)
PROGRESS NOTE  MECIA KRAFT [id number] DOB: 1945-11-27 DOA: 10/10/2015 PCP: Harriett Sine, MD  Brief Narrative: 70 year old woman with coronary artery disease, combined systolic/diastolic congestive heart failure, chronic kidney disease stage IV presented with shortness of breath, respiratory distress, started on BiPAP and admitted for hypercapnic respiratory failure, respiratory acidosis, CHF exacerbation. EKG SR. ECHO was ordered. Nephrology has evaluated her. Nephrology following. Cardiology consulted recommended repeat cath. Cardiology consulted for further management.  Assessment/Plan: 1. NSTEMI secondary to acute CHF. Troponin has peaked. Echocardiogram pending. Continue aspirin, metoprolol, Imdur, ticagrelor. 2. Acute hypercapneic resp failure with acute resp acidosis secondary to CHF, acute issues resolve. Resolved. Off oxygen. 3. Acute on chronic combined systolic, diastolic CHF, complicated by CKD stage IV. Excellent diuresis, -4 L since admission. LVEF 40-45 percent. Grade 2 diastolic dysfunction. No ACE inhibitor for now given kidney issues. 4. Acute kidney injury superimposed on chronic kidney disease stage IV. Overall improved. Appears to be compensated now. 5. Coronary artery disease status post stent April 2017 6. Diabetes mellitus type 2, was noted to have an episode of hyperglycemia last night.    Continues to improve clinically.  Cardiology recommends repeat cath, patient will consider   Continue current management  Adjust insulin.   DVT prophylaxis: Heparin Code Status: FULL  Family Communication: Multiple family members at bedside including her husband and daughter Disposition Plan: Anticipate discharge next 24 hours   Murray Hodgkins, MD  [facility] Direct contact: [phone] --Via [place]  --[email or web address]; password [email or web address]  7PM-7AM contact night coverage as above 10/12/2015, 6:02 AM  LOS: 2 days    Consultants:  Nephrology  Procedures:  ECHO  Procedure narrative: Transthoracic echocardiography. Image   quality was poor. The study was technically difficult, as a   result of poor acoustic windows, poor sound wave transmission,   and breast implants. - Left ventricle: The cavity size was normal. There was moderate   concentric hypertrophy. Systolic function was mildly to   moderately reduced. The estimated ejection fraction was in the   range of 40% to 45%. Hypokinesis of the mid-apicalanterolateral   and inferolateral myocardium; consistent with ischemia in the   distribution of the left circumflex coronary artery. Possible   hypokinesis of the mid-apicalanterior myocardium; in the   distribution of the left anterior descending coronary artery.   (Anterior wall not as well visualized). Features are consistent   with a pseudonormal left ventricular filling pattern, with   concomitant abnormal relaxation and increased filling pressure   (grade 2 diastolic dysfunction). - Mitral valve: There was mild regurgitation. - Left atrium: The atrium was mildly dilated.  Antimicrobials:  None   HPI/Subjective: Feels well. Slept well. Breathing and eating well. Denies N/V, and pain.   Objective: Vitals:   10/11/15 1200 10/11/15 1239 10/11/15 2118 10/12/15 0443  BP: (!) 134/55  (!) 153/69 (!) 122/51  Pulse: 85  70 (!) 56  Resp: (!) 21  20 14   Temp:  97.6 F (36.4 C) 98.3 F (36.8 C) 98.7 F (37.1 C)  TempSrc:  Oral Oral Oral  SpO2: 96%  98% 98%  Weight:      Height:        Intake/Output Summary (Last 24 hours) at 10/12/15 0602 Last data filed at 10/11/15 1000  Gross per 24 hour  Intake                0 ml  Output              400 ml  Net             -400 ml     Filed Weights   10/10/15 0704 10/11/15 0500  Weight: 95.8 kg (211 lb 3.2 oz) 95 kg (209 lb 7 oz)    Exam:    Constitutional:  . Appears calm and comfortable Respiratory:  . CTA bilaterally, no  w/r/r.  . Respiratory effort normal. No retractions or accessory muscle use Cardiovascular:  . RRR, no m/r/g . No LE extremity edema   . Telemetry sinus rhythm  I have personally reviewed following labs and imaging studies:  BUN 43, Cr 1.82 stable   Troponin down to 0.54  Hyperglycemic 200-300s  EKG revealed sinus bradycardia. No acute changes.   Scheduled Meds: . amLODipine  10 mg Oral Daily  . antiseptic oral rinse  7 mL Mouth Rinse q12n4p  . aspirin EC  81 mg Oral Daily  . chlorhexidine  15 mL Mouth Rinse BID  . heparin  5,000 Units Subcutaneous Q8H  . hydrALAZINE  37.5 mg Oral Q8H  . insulin aspart  0-15 Units Subcutaneous TID WC  . insulin aspart  0-5 Units Subcutaneous QHS  . insulin glargine  25 Units Subcutaneous QHS  . isosorbide mononitrate  60 mg Oral Daily  . metoprolol  50 mg Oral BID  . potassium chloride  40 mEq Oral Daily  . sodium chloride flush  3 mL Intravenous Q12H  . ticagrelor  90 mg Oral BID  . torsemide  40 mg Oral Daily   Continuous Infusions:   Principal Problem:   Acute on chronic combined systolic and diastolic CHF (congestive heart failure) (HCC) Active Problems:   Overweight   Essential hypertension   CORONARY ATHEROSCLEROSIS NATIVE CORONARY ARTERY   Type 2 diabetes mellitus with stage 4 chronic kidney disease (HCC)   Acute respiratory failure with hypercapnia ([place])   AKI (acute kidney injury) ([place])   LOS: 2 days   Time spent 25 minutes   By signing my name below, I, Collene Leyden, attest that this documentation has been prepared under the direction and in the presence of Murray Hodgkins, MD. Electronically signed: Collene Leyden, Scribe 10/12/15 1:12 PM   I personally performed the services described in this documentation. All medical record entries made by the scribe were at my direction. I have reviewed the chart and agree that the record reflects my personal performance and is accurate and complete. Murray Hodgkins, MD

## 2015-10-12 NOTE — Progress Notes (Signed)
SATURATION QUALIFICATIONS: (This note is used to comply with regulatory documentation for home oxygen)  Patient Saturations on Room Air at Rest = 98%  Patient Saturations on Room Air while Ambulating = 98%  Patient Saturations on 2 Liters of oxygen while Ambulating = 100%  Please briefly explain why patient needs home oxygen:  No need for home O2 noted at this time

## 2015-10-12 NOTE — Progress Notes (Signed)
Subjective: Interval History: The patient offers no complaints. She denies any difficulty in breathing. She states that her urine output has declined.  Objective: Vital signs in last 24 hours: Temp:  [97.6 F (36.4 C)-98.7 F (37.1 C)] 98.7 F (37.1 C) (08/07 0443) Pulse Rate:  [56-85] 56 (08/07 0443) Resp:  [14-21] 14 (08/07 0443) BP: (122-153)/(51-69) 122/51 (08/07 0443) SpO2:  [95 %-99 %] 98 % (08/07 0443) Weight:  [91.5 kg (201 lb 11.2 oz)] 91.5 kg (201 lb 11.2 oz) (08/07 0443) Weight change: -4.309 kg (-9 lb 8 oz)  Intake/Output from previous day: 08/06 0701 - 08/07 0700 In: -  Out: 400 [Urine:400] Intake/Output this shift: No intake/output data recorded.  General appearance: alert, cooperative and no distress Resp: diminished breath sounds bilaterally Cardio: regular rate and rhythm GI: soft, non-tender; bowel sounds normal; no masses,  no organomegaly Extremities: edema Trace 1+ edema  Lab Results:  Recent Labs  10/10/15 0404  WBC 10.2  HGB 12.0  HCT 38.5  PLT 215   BMET:   Recent Labs  10/12/15 0621 10/12/15 0623  NA 139 138  K 3.7 3.7  CL 101 100*  CO2 28 28  GLUCOSE 251* 250*  BUN 43* 43*  CREATININE 1.83* 1.82*  CALCIUM 8.9 8.9   No results for input(s): PTH in the last 72 hours. Iron Studies: No results for input(s): IRON, TIBC, TRANSFERRIN, FERRITIN in the last 72 hours.  Studies/Results: No results found.  I have reviewed the patient's current medications.  Assessment/Plan: Problem #1 acute kidney injury superimposed on chronic. Presently her renal function Is stable and patient is asymptomatic. Problem #2 chronic renal failure: Thought to be secondary to diabetes versus hypertension. Recently her creatinine seems to be much better. At this moment her baseline renal function seems to be stage III. Problem #3 difficulty in breathing: Much better. Possibly a combination of pneumonia and pulmonary edema. Patient presently on Demadex 40 mg by  mouth once a day. Her urine output however has declined. Patient presently does not have any difficulty breathing or orthopnea. Problem #4 hypertension: Her blood pressure is reasonably controlled Problem #5 diabetes Problem #6 metabolic bone disease: Her calcium and phosphorus is in range. Problem #7 anemia: Her hemoglobin is within target goal. Plan: 1] patient advised to decrease her salt and fluid intake.          2] We will increase Demadex 60 mg by mouth daily          3] we'll check her renal panel in the morning.          4] if patient is going to be discharged she'll be followed by her regular nephrologist as an outpatient.   LOS: 2 days   Ercelle Winkles S 10/12/2015,8:09 AM   activity such as

## 2015-10-12 NOTE — Progress Notes (Signed)
1240 RT reported abnormal EKG, Dr.Branch notified.

## 2015-10-13 ENCOUNTER — Inpatient Hospital Stay (HOSPITAL_COMMUNITY): Payer: BLUE CROSS/BLUE SHIELD

## 2015-10-13 ENCOUNTER — Encounter (HOSPITAL_COMMUNITY)
Admission: EM | Disposition: A | Discharge status: Home / Self Care | Payer: Self-pay | Source: Home / Self Care | Attending: Family Medicine

## 2015-10-13 ENCOUNTER — Ambulatory Visit: Payer: Medicare Other | Admitting: "Endocrinology

## 2015-10-13 ENCOUNTER — Ambulatory Visit (HOSPITAL_COMMUNITY): Admit: 2015-10-13 | Payer: Self-pay | Admitting: Cardiovascular Disease

## 2015-10-13 DIAGNOSIS — I25110 Atherosclerotic heart disease of native coronary artery with unstable angina pectoris: Secondary | ICD-10-CM

## 2015-10-13 HISTORY — PX: CARDIAC CATHETERIZATION: SHX172

## 2015-10-13 LAB — RENAL FUNCTION PANEL
ANION GAP: 10 (ref 5–15)
Albumin: 3.4 g/dL — ABNORMAL LOW (ref 3.5–5.0)
BUN: 44 mg/dL — AB (ref 6–20)
CALCIUM: 8.9 mg/dL (ref 8.9–10.3)
CO2: 26 mmol/L (ref 22–32)
CREATININE: 1.65 mg/dL — AB (ref 0.44–1.00)
Chloride: 101 mmol/L (ref 101–111)
GFR calc Af Amer: 35 mL/min — ABNORMAL LOW (ref >60)
GFR calc non Af Amer: 30 mL/min — ABNORMAL LOW (ref >60)
GLUCOSE: 211 mg/dL — AB (ref 65–99)
Phosphorus: 2.8 mg/dL (ref 2.5–4.6)
Potassium: 3.8 mmol/L (ref 3.5–5.1)
SODIUM: 137 mmol/L (ref 135–145)

## 2015-10-13 LAB — POCT ACTIVATED CLOTTING TIME: Activated Clotting Time: 401 seconds

## 2015-10-13 LAB — GLUCOSE, CAPILLARY
GLUCOSE-CAPILLARY: 170 mg/dL — AB (ref 65–99)
Glucose-Capillary: 112 mg/dL — ABNORMAL HIGH (ref 65–99)
Glucose-Capillary: 130 mg/dL — ABNORMAL HIGH (ref 65–99)
Glucose-Capillary: 234 mg/dL — ABNORMAL HIGH (ref 65–99)
Glucose-Capillary: 318 mg/dL — ABNORMAL HIGH (ref 65–99)

## 2015-10-13 LAB — TROPONIN I: TROPONIN I: 0.23 ng/mL — AB (ref <0.03)

## 2015-10-13 LAB — PROTIME-INR
INR: 1.18
PROTHROMBIN TIME: 15.1 s (ref 11.4–15.2)

## 2015-10-13 SURGERY — LEFT HEART CATH AND CORONARY ANGIOGRAPHY

## 2015-10-13 MED ORDER — FENTANYL CITRATE (PF) 100 MCG/2ML IJ SOLN
INTRAMUSCULAR | Status: DC | PRN
  Administered 2015-10-13 (×2): 25 ug via INTRAVENOUS

## 2015-10-13 MED ORDER — SODIUM CHLORIDE 0.9 % IV SOLN: 250.0000 mL | INTRAVENOUS | Status: DC | PRN

## 2015-10-13 MED ORDER — ZOLPIDEM TARTRATE 5 MG PO TABS: 5.0000 mg | ORAL_TABLET | Freq: Every evening | ORAL | Status: DC | PRN

## 2015-10-13 MED ORDER — NITROGLYCERIN 1 MG/10 ML FOR IR/CATH LAB
INTRA_ARTERIAL | Status: AC
  Filled 2015-10-13: qty 10

## 2015-10-13 MED ORDER — MIDAZOLAM HCL 2 MG/2ML IJ SOLN
INTRAMUSCULAR | Status: DC | PRN
  Administered 2015-10-13: 1 mg via INTRAVENOUS
  Administered 2015-10-13: 2 mg via INTRAVENOUS

## 2015-10-13 MED ORDER — SODIUM CHLORIDE 0.9% FLUSH
3.0000 mL | Freq: Two times a day (BID) | INTRAVENOUS | Status: DC
  Administered 2015-10-14: 11:00:00 3 mL via INTRAVENOUS

## 2015-10-13 MED ORDER — FENTANYL CITRATE (PF) 100 MCG/2ML IJ SOLN
INTRAMUSCULAR | Status: AC
  Filled 2015-10-13: qty 2

## 2015-10-13 MED ORDER — SODIUM CHLORIDE 0.9 % IV SOLN: INTRAVENOUS | Status: DC

## 2015-10-13 MED ORDER — METOPROLOL SUCCINATE ER 50 MG PO TB24
50.0000 mg | ORAL_TABLET | Freq: Two times a day (BID) | ORAL | Status: DC
  Administered 2015-10-14: 50 mg via ORAL
  Filled 2015-10-13 (×2): qty 1

## 2015-10-13 MED ORDER — SODIUM CHLORIDE 0.9 % IV SOLN
INTRAVENOUS | Status: DC
  Administered 2015-10-13: 20:00:00 via INTRAVENOUS

## 2015-10-13 MED ORDER — SODIUM CHLORIDE 0.9 % WEIGHT BASED INFUSION: 3.0000 mL/kg/h | INTRAVENOUS | Status: AC

## 2015-10-13 MED ORDER — SODIUM CHLORIDE 0.9 % IV SOLN
INTRAVENOUS | Status: DC | PRN
  Administered 2015-10-13: 1.75 mg/kg/h via INTRAVENOUS

## 2015-10-13 MED ORDER — IOPAMIDOL (ISOVUE-370) INJECTION 76%
INTRAVENOUS | Status: DC | PRN
  Administered 2015-10-13: 110 mL via INTRA_ARTERIAL

## 2015-10-13 MED ORDER — ROSUVASTATIN CALCIUM 20 MG PO TABS
40.0000 mg | ORAL_TABLET | Freq: Every day | ORAL | Status: DC
  Filled 2015-10-13: qty 2

## 2015-10-13 MED ORDER — NITROGLYCERIN 1 MG/10 ML FOR IR/CATH LAB
INTRA_ARTERIAL | Status: DC | PRN
  Administered 2015-10-13: 200 ug via INTRACORONARY

## 2015-10-13 MED ORDER — ASPIRIN EC 81 MG PO TBEC: 81.0000 mg | DELAYED_RELEASE_TABLET | Freq: Every day | ORAL | Status: DC

## 2015-10-13 MED ORDER — MIDAZOLAM HCL 2 MG/2ML IJ SOLN
INTRAMUSCULAR | Status: AC
  Filled 2015-10-13: qty 2

## 2015-10-13 MED ORDER — IOPAMIDOL (ISOVUE-370) INJECTION 76%
INTRAVENOUS | Status: AC
  Filled 2015-10-13: qty 100

## 2015-10-13 MED ORDER — ACETAMINOPHEN 325 MG PO TABS
650.0000 mg | ORAL_TABLET | ORAL | Status: DC | PRN
  Administered 2015-10-13: 650 mg via ORAL
  Filled 2015-10-13: qty 2

## 2015-10-13 MED ORDER — BIVALIRUDIN BOLUS VIA INFUSION - CUPID
INTRAVENOUS | Status: DC | PRN
  Administered 2015-10-13: 68.475 mg via INTRAVENOUS

## 2015-10-13 MED ORDER — SODIUM CHLORIDE 0.9 % IV SOLN
1.7500 mg/kg/h | Freq: Once | INTRAVENOUS | Status: DC
  Filled 2015-10-13: qty 250

## 2015-10-13 MED ORDER — SODIUM CHLORIDE 0.9% FLUSH: 3.0000 mL | INTRAVENOUS | Status: DC | PRN

## 2015-10-13 MED ORDER — LIDOCAINE HCL (PF) 1 % IJ SOLN
INTRAMUSCULAR | Status: DC | PRN
  Administered 2015-10-13: 30 mL via INTRADERMAL

## 2015-10-13 MED ORDER — TICAGRELOR 90 MG PO TABS: 90.0000 mg | ORAL_TABLET | Freq: Two times a day (BID) | ORAL | Status: DC

## 2015-10-13 MED ORDER — BIVALIRUDIN 250 MG IV SOLR
INTRAVENOUS | Status: AC
  Filled 2015-10-13: qty 250

## 2015-10-13 MED ORDER — HEPARIN (PORCINE) IN NACL 2-0.9 UNIT/ML-% IJ SOLN
INTRAMUSCULAR | Status: DC | PRN
  Administered 2015-10-13: 1500 mL via INTRA_ARTERIAL

## 2015-10-13 MED ORDER — SODIUM CHLORIDE 0.9 % WEIGHT BASED INFUSION: 1.0000 mL/kg/h | INTRAVENOUS | Status: DC

## 2015-10-13 MED ORDER — RANOLAZINE ER 500 MG PO TB12
500.0000 mg | ORAL_TABLET | Freq: Two times a day (BID) | ORAL | Status: DC
  Administered 2015-10-14: 11:00:00 500 mg via ORAL
  Filled 2015-10-13 (×2): qty 1

## 2015-10-13 MED ORDER — LIDOCAINE HCL (PF) 1 % IJ SOLN
INTRAMUSCULAR | Status: AC
  Filled 2015-10-13: qty 30

## 2015-10-13 MED ORDER — HEPARIN (PORCINE) IN NACL 2-0.9 UNIT/ML-% IJ SOLN
INTRAMUSCULAR | Status: AC
  Filled 2015-10-13: qty 1500

## 2015-10-13 MED ORDER — ONDANSETRON HCL 4 MG/2ML IJ SOLN: 4.0000 mg | Freq: Four times a day (QID) | INTRAMUSCULAR | Status: DC | PRN

## 2015-10-13 SURGICAL SUPPLY — 15 items
BALLN ANGIOSCULPT RX 2.5X10 (BALLOONS) ×2
BALLOON ANGIOSCULPT RX 2.5X10 (BALLOONS) ×1 IMPLANT
CATH INFINITI 5FR MULTPACK ANG (CATHETERS) ×2 IMPLANT
CATH VISTA GUIDE 6FR JR4 (CATHETERS) ×2 IMPLANT
GLIDESHEATH SLEND SS 6F .021 (SHEATH) IMPLANT
KIT ENCORE 26 ADVANTAGE (KITS) ×2 IMPLANT
KIT HEART LEFT (KITS) ×2 IMPLANT
PACK CARDIAC CATHETERIZATION (CUSTOM PROCEDURE TRAY) ×2 IMPLANT
SHEATH PINNACLE 5F 10CM (SHEATH) ×2 IMPLANT
SHEATH PINNACLE 6F 10CM (SHEATH) ×2 IMPLANT
TRANSDUCER W/STOPCOCK (MISCELLANEOUS) ×2 IMPLANT
TUBING CIL FLEX 10 FLL-RA (TUBING) ×2 IMPLANT
WIRE COUGAR XT STRL 190CM (WIRE) ×2 IMPLANT
WIRE EMERALD 3MM-J .035X150CM (WIRE) ×2 IMPLANT
WIRE SAFE-T 1.5MM-J .035X260CM (WIRE) IMPLANT

## 2015-10-13 NOTE — Progress Notes (Signed)
PROGRESS NOTE  [name] [id number] DOB: 10/15/1945 DOA: 10/10/2015 PCP: Harriett Sine, MD  Brief Narrative: 70 year old woman with coronary artery disease, combined systolic/diastolic congestive heart failure, chronic kidney disease stage IV presented with shortness of breath, respiratory distress, started on BiPAP and admitted for hypercapnic respiratory failure, respiratory acidosis, CHF exacerbation. Quickly weaned off BiPAP and respiratory status has remained stable. Ruled in for NSTEMI, seen by cardiology with recommendation for transfer to [facility] left heart cath. Nephrology followed for acute kidney injury which improved.  Assessment/Plan: 1. NSTEMI secondary to acute CHF. Troponin has peaked. Echocardiogram pending. Continue aspirin, metoprolol, Imdur, ticagrelor. 2. Acute on chronic combined systolic, diastolic CHF, complicated by CKD stage IV. Excellent diuresis, -4.9 L since admission. LVEF 40-45 percent. Grade 2 diastolic dysfunction. No ACE inhibitor for now given kidney issues.  3. Acute hypercapneic resp failure with acute resp acidosis secondary to CHF, acute issues resolve. Resolved. Off oxygen. 4. Acute kidney injury superimposed on chronic kidney disease stage IV. Overall improved. Appears to be compensated now. 5. Coronary artery disease status post stent April 2017 6. Diabetes mellitus . Stable.    Overall stable. Plan transfer to [facility] for [place] per cardiology    Patient going to cardiology  service, [facility] will sign off. Please call us if we can be of further assistance at [facility] [phone])  DVT prophylaxis: Heparin  Code Status: Full Family Communication: No family bedside  Disposition Plan: [facility]  Murray Hodgkins, MD  [facility] Direct contact: [phone] --Via amion app [place]  --[email or web address]; password [email or web address]  7PM-7AM contact night coverage as above 10/13/2015, 7:16 AM  LOS: 3 days   Consultants:  Nephrology   Cardiology    Procedures:  None   Antimicrobials:  None   HPI/Subjective: Feels better. No overnight problems. Breathing well. Eating well. Denies CP  Objective: Vitals:   10/12/15 2000 10/13/15 0000 10/13/15 0654 10/13/15 0701  BP: 139/71 (!) 109/43 (!) 144/64   Pulse: 62 62 62   Resp:   20   Temp: 98.5 F (36.9 C) 98.7 F (37.1 C) 99.3 F (37.4 C)   TempSrc: Oral Oral Oral   SpO2: 99% 96% 99%   Weight:    91.3 kg (201 lb 4.8 oz)  Height:        Intake/Output Summary (Last 24 hours) at 10/13/15 0716 Last data filed at 10/12/15 1900  Gross per 24 hour  Intake              720 ml  Output             1600 ml  Net             -880 ml     Filed Weights   10/11/15 0500 10/12/15 0443 10/13/15 0701  Weight: 95 kg (209 lb 7 oz) 91.5 kg (201 lb 11.2 oz) 91.3 kg (201 lb 4.8 oz)    Exam:    Constitutional:  . Appears calm and comfortable Respiratory:  . CTA bilaterally, no w/r/r.  . Respiratory effort normal. No retractions or accessory muscle use Cardiovascular:  . RRR, no m/r/g . No LE extremity edema   . Telemetry sinus rhythm    I have personally reviewed following labs and imaging studies:  BUN 44, Cr 1.65  improving  Blood sugars Stable.   Scheduled Meds: . amLODipine  10 mg Oral Daily  . antiseptic oral rinse  7 mL Mouth Rinse q12n4p  . aspirin EC  81 mg Oral Daily  .  chlorhexidine  15 mL Mouth Rinse BID  . heparin  5,000 Units Subcutaneous Q8H  . hydrALAZINE  37.5 mg Oral Q8H  . insulin aspart  0-15 Units Subcutaneous TID WC  . insulin aspart  0-5 Units Subcutaneous QHS  . insulin glargine  50 Units Subcutaneous QHS  . isosorbide mononitrate  60 mg Oral Daily  . metoprolol  50 mg Oral BID  . potassium chloride  40 mEq Oral Daily  . sodium chloride flush  3 mL Intravenous Q12H  . ticagrelor  90 mg Oral BID  . torsemide  60 mg Oral Daily   Continuous Infusions:   Principal Problem:   Acute on chronic combined systolic and diastolic CHF (congestive  heart failure) (HCC) Active Problems:   Overweight   Essential hypertension   CORONARY ATHEROSCLEROSIS NATIVE CORONARY ARTERY   Type 2 diabetes mellitus with stage 4 chronic kidney disease (HCC)   NSTEMI (non-ST elevated myocardial infarction) ([place])   Acute respiratory failure with hypercapnia ([place])   AKI (acute kidney injury) ([place])   LOS: 3 days   Time spent 10 minutes    By signing my name below, I, Collene Leyden, attest that this documentation has been prepared under the direction and in the presence of Murray Hodgkins, MD. Electronically signed: Collene Leyden, [place] 10/13/15 10:42 AM  I personally performed the services described in this documentation. All medical record entries made by the scribe were at my direction. I have reviewed the chart and agree that the record reflects my personal performance and is accurate and complete. Murray Hodgkins, MD

## 2015-10-13 NOTE — Progress Notes (Signed)
PT Cancellation Note  Patient Details Name: [name] MRN: [id number] DOB: [birth year]   Cancelled Treatment:    Reason Eval/Treat Not Completed: Other (comment) (PT evaluation received today, RN states that pt is scheduled to transfer to [facility] today for a heart catheterization and then will be staying there.  Pt states she feels her mobility is at baseline.  PT will sign off at this time. )   Beth Coral Soler, PT, DPT X: [phone]

## 2015-10-13 NOTE — Progress Notes (Signed)
Subjective: Interval History: The patient offers no complaints. She denies any difficulty in breathing.Her appetite is good  Objective: Vital signs in last 24 hours: Temp:  [98.2 F (36.8 C)-99.3 F (37.4 C)] 99.3 F (37.4 C) (08/08 0654) Pulse Rate:  [56-62] 62 (08/08 0654) Resp:  [20] 20 (08/08 0654) BP: (109-144)/(43-71) 144/64 (08/08 0654) SpO2:  [96 %-100 %] 99 % (08/08 0654) Weight:  [91.3 kg (201 lb 4.8 oz)] 91.3 kg (201 lb 4.8 oz) (08/08 0701) Weight change: -0.181 kg (-6.4 oz)  Intake/Output from previous day: 08/07 0701 - 08/08 0700 In: 720 [P.O.:720] Out: 1600 [Urine:1600] Intake/Output this shift: No intake/output data recorded.  Generally she is alert and in no apparent distress Chest is clear Heart :RRR no murmur Extremities: Trace edema  Lab Results: No results for input(s): WBC, HGB, HCT, PLT in the last 72 hours. BMET:   Recent Labs  10/12/15 0623 10/13/15 0616  NA 138 137  K 3.7 3.8  CL 100* 101  CO2 28 26  GLUCOSE 250* 211*  BUN 43* 44*  CREATININE 1.82* 1.65*  CALCIUM 8.9 8.9   No results for input(s): PTH in the last 72 hours. Iron Studies: No results for input(s): IRON, TIBC, TRANSFERRIN, FERRITIN in the last 72 hours.  Studies/Results: No results found.  I have reviewed the patient's current medications.  Assessment/Plan: Problem #1 acute kidney injury superimposed on chronic. Presently her renal function Is continuously improving and presently has reached her base line Problem #2 chronic renal failure: Thought to be secondary to diabetes versus hypertension. Recently her creatinine seems to be much better. At this moment her baseline renal function seems to be stage III. Problem #3 difficulty in breathing: Much better. Possibly a combination of pneumonia and pulmonary edema. Patient presently on Demadex 60 mg by mouth once a day. She had 1600 cc of urine out and Patient presently does not have any difficulty breathing or  orthopnea. Problem #4 hypertension: Her blood pressure is reasonably controlled Problem #5 diabetes Problem #6 metabolic bone disease: Her calcium and phosphorus is in range. Problem #7 anemia: Her hemoglobin is within target goal. Plan: 1]Continue with present treatment          2] we'll check her renal panel in the morning.          3] if patient is going to be discharged she'll be followed by her regular nephrologist as an outpatient.   LOS: 3 days   Crystalee Ventress S 10/13/2015,9:02 AM   activity such as

## 2015-10-13 NOTE — Progress Notes (Signed)
Patient transferred to [facility] per Dr. Harl Bowie. Report called to [place], RN on [address].  Verbalized understanding.  Report given to [facility] prior to arrival.  Verbalized understanding.  Patient alert, oriented, VSS, IV patent to right hand.  Patient left floor via stretcher in stable condition accompanied by [place] staff.

## 2015-10-13 NOTE — Progress Notes (Signed)
Subjective:    No complaints  Objective:   Temp:  [98.2 F (36.8 C)-99.3 F (37.4 C)] 99.3 F (37.4 C) (08/08 0654) Pulse Rate:  [56-62] 62 (08/08 0654) Resp:  [20] 20 (08/08 0654) BP: (109-144)/(43-71) 144/64 (08/08 0654) SpO2:  [96 %-100 %] 99 % (08/08 0654) Weight:  [201 lb 4.8 oz (91.3 kg)] 201 lb 4.8 oz (91.3 kg) (08/08 0701) Last BM Date: 10/11/15  Filed Weights   10/11/15 0500 10/12/15 0443 10/13/15 0701  Weight: 209 lb 7 oz (95 kg) 201 lb 11.2 oz (91.5 kg) 201 lb 4.8 oz (91.3 kg)    Intake/Output Summary (Last 24 hours) at 10/13/15 0952 Last data filed at 10/13/15 0900  Gross per 24 hour  Intake              480 ml  Output             1300 ml  Net             -820 ml    Telemetry: SR, PVCs  Exam:  General: NAD  HEENT: sclera clear, throat clear  Resp: CTAB  Cardiac: RRR, no m/r/g, no jvd  GI: abdomen soft, NT, ND  MSK: no LE edema  Neuro: no focal deficits  Psych: appropriate affect  Lab Results:  Basic Metabolic Panel:  Recent Labs Lab 10/12/15 0621 10/12/15 0623 10/13/15 0616  NA 139 138 137  K 3.7 3.7 3.8  CL 101 100* 101  CO2 28 28 26   GLUCOSE 251* 250* 211*  BUN 43* 43* 44*  CREATININE 1.83* 1.82* 1.65*  CALCIUM 8.9 8.9 8.9    Liver Function Tests:  Recent Labs Lab 10/11/15 0451 10/12/15 0623 10/13/15 0616  ALBUMIN 3.5 3.5 3.4*    CBC:  Recent Labs Lab 10/10/15 0404  WBC 10.2  HGB 12.0  HCT 38.5  MCV 102.9*  PLT 215    Cardiac Enzymes:  Recent Labs Lab 10/10/15 1946 10/11/15 0451 10/12/15 0621  TROPONINI 1.58* 1.22* 0.54*    BNP: No results for input(s): PROBNP in the last 8760 hours.  Coagulation: No results for input(s): INR in the last 168 hours.  ECG:   Medications:   Scheduled Medications: . amLODipine  10 mg Oral Daily  . antiseptic oral rinse  7 mL Mouth Rinse q12n4p  . aspirin EC  81 mg Oral Daily  . chlorhexidine  15 mL Mouth Rinse BID  . heparin  5,000 Units Subcutaneous Q8H    . hydrALAZINE  37.5 mg Oral Q8H  . insulin aspart  0-15 Units Subcutaneous TID WC  . insulin aspart  0-5 Units Subcutaneous QHS  . insulin glargine  50 Units Subcutaneous QHS  . isosorbide mononitrate  60 mg Oral Daily  . metoprolol  50 mg Oral BID  . potassium chloride  40 mEq Oral Daily  . sodium chloride flush  3 mL Intravenous Q12H  . ticagrelor  90 mg Oral BID  . torsemide  60 mg Oral Daily     Infusions:     PRN Medications:  sodium chloride, acetaminophen, ALPRAZolam, benzonatate, ondansetron (ZOFRAN) IV, sodium chloride flush     Assessment/Plan   1. 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  - no current symptoms, she is back on oral diuretics per renal - oral regimen with hydral 37.5mg  tid, imdur 60,  lopressor 50mg  bid. We will change to Toprol  XL in setting of systolic dysfunction. Defer diuretics to renal.    2. NSTEMI - peak trop 1.6, now trending down. Repeat echo pending - history indicates 2 recent episodes of acute pulmonary edema, given the acuity and magnitude of her troponin elevation my concern would be her pulmonary edema episodes are ischemic driven, either a potential issue with her prior stented arteries or potentially ischemia related to her LCX subtotal occlusion. - I have recommended repeat cath  to evaluate. I think if her previous stented arteries look good, her LCX could be a culprit and will need to be considered for possible intervention if thought amenable from anatomical standpoint.  Risks given her renal function were discussed,she understands the risks and wishes to proceed. We will arrange for transfer to day for repeat cath. With no current symptoms and normalizing troponin she is not currently on heparin gtt.       Carlyle Dolly, M.D.

## 2015-10-14 ENCOUNTER — Ambulatory Visit: Payer: BLUE CROSS/BLUE SHIELD | Admitting: Cardiovascular Disease

## 2015-10-14 ENCOUNTER — Encounter (HOSPITAL_COMMUNITY): Payer: Self-pay | Admitting: Cardiovascular Disease

## 2015-10-14 ENCOUNTER — Inpatient Hospital Stay (HOSPITAL_COMMUNITY): Payer: BLUE CROSS/BLUE SHIELD

## 2015-10-14 DIAGNOSIS — N179 Acute kidney failure, unspecified: Secondary | ICD-10-CM

## 2015-10-14 DIAGNOSIS — R079 Chest pain, unspecified: Secondary | ICD-10-CM

## 2015-10-14 DIAGNOSIS — I10 Essential (primary) hypertension: Secondary | ICD-10-CM

## 2015-10-14 LAB — GLUCOSE, CAPILLARY
Glucose-Capillary: 104 mg/dL — ABNORMAL HIGH (ref 65–99)
Glucose-Capillary: 227 mg/dL — ABNORMAL HIGH (ref 65–99)
Glucose-Capillary: 306 mg/dL — ABNORMAL HIGH (ref 65–99)

## 2015-10-14 LAB — CBC
HCT: 34.2 % — ABNORMAL LOW (ref 36.0–46.0)
HEMOGLOBIN: 10.5 g/dL — AB (ref 12.0–15.0)
MCH: 31.6 pg (ref 26.0–34.0)
MCHC: 30.7 g/dL (ref 30.0–36.0)
MCV: 103 fL — ABNORMAL HIGH (ref 78.0–100.0)
PLATELETS: 182 10*3/uL (ref 150–400)
RBC: 3.32 MIL/uL — AB (ref 3.87–5.11)
RDW: 15.8 % — ABNORMAL HIGH (ref 11.5–15.5)
WBC: 6.2 10*3/uL (ref 4.0–10.5)

## 2015-10-14 LAB — BASIC METABOLIC PANEL
ANION GAP: 11 (ref 5–15)
BUN: 31 mg/dL — ABNORMAL HIGH (ref 6–20)
CALCIUM: 9.2 mg/dL (ref 8.9–10.3)
CO2: 24 mmol/L (ref 22–32)
CREATININE: 1.49 mg/dL — AB (ref 0.44–1.00)
Chloride: 104 mmol/L (ref 101–111)
GFR, EST AFRICAN AMERICAN: 40 mL/min — AB (ref >60)
GFR, EST NON AFRICAN AMERICAN: 34 mL/min — AB (ref >60)
Glucose, Bld: 262 mg/dL — ABNORMAL HIGH (ref 65–99)
Potassium: 3.9 mmol/L (ref 3.5–5.1)
SODIUM: 139 mmol/L (ref 135–145)

## 2015-10-14 LAB — POCT ACTIVATED CLOTTING TIME
Activated Clotting Time: 186 seconds
Activated Clotting Time: 131 seconds

## 2015-10-14 LAB — ECHOCARDIOGRAM COMPLETE
HEIGHTINCHES: 70 in
WEIGHTICAEL: 3174.62 [oz_av]

## 2015-10-14 MED ORDER — POTASSIUM CHLORIDE CRYS ER 20 MEQ PO TBCR: 40.0000 meq | EXTENDED_RELEASE_TABLET | Freq: Every day | ORAL | 5 refills | Status: DC

## 2015-10-14 MED ORDER — PERFLUTREN LIPID MICROSPHERE
INTRAVENOUS | Status: AC
  Administered 2015-10-14: 11:00:00 2 mL
  Filled 2015-10-14: qty 10

## 2015-10-14 MED ORDER — TICAGRELOR 90 MG PO TABS: 90.0000 mg | ORAL_TABLET | Freq: Two times a day (BID) | ORAL | 10 refills | Status: DC

## 2015-10-14 MED ORDER — PERFLUTREN LIPID MICROSPHERE
1.0000 mL | INTRAVENOUS | Status: AC | PRN
  Filled 2015-10-14: qty 10

## 2015-10-14 MED ORDER — TICAGRELOR 90 MG PO TABS: 90.0000 mg | ORAL_TABLET | Freq: Two times a day (BID) | ORAL | 0 refills | Status: DC

## 2015-10-14 MED ORDER — METOPROLOL SUCCINATE ER 50 MG PO TB24: 50.0000 mg | ORAL_TABLET | Freq: Two times a day (BID) | ORAL | 5 refills | Status: DC

## 2015-10-14 MED ORDER — TORSEMIDE 20 MG PO TABS: 60.0000 mg | ORAL_TABLET | Freq: Every day | ORAL | 5 refills | Status: DC

## 2015-10-14 MED ORDER — NITROGLYCERIN 0.4 MG SL SUBL: 0.4000 mg | SUBLINGUAL_TABLET | SUBLINGUAL | 2 refills | Status: AC | PRN

## 2015-10-14 MED ORDER — RANOLAZINE ER 500 MG PO TB12: 500.0000 mg | ORAL_TABLET | Freq: Two times a day (BID) | ORAL | 5 refills | Status: DC

## 2015-10-14 NOTE — Progress Notes (Signed)
Patient Profile: 70 y.o.female history of CAD with NSTEMI 06/2015 with DES to PDA and RCA at that time. She had a subtotal occlusion of LCX that was treated medically, with recs if viability shown could consider intervention. Chronic systolic HF LVEF [id number], grade II diastolic dysfunction, right subclavian stenosis s/p intervention, DM2, HTN, HL, CKD IV admitted with SOB. Initially required bipap in [facility]. She ruled in for NSTEMI and was transferred to [facility] 10/13/15 for [place].   Subjective: Doing well. No recurrent dyspnea. No CP. Groin is stable.   Objective: Vital signs in last 24 hours: Temp:  [97.1 F (36.2 C)-98.6 F (37 C)] 98 F (36.7 C) (08/09 0807) Pulse Rate:  [0-87] 63 (08/09 0807) Resp:  [0-48] 17 (08/09 0807) BP: (119-166)/(45-79) 126/53 (08/09 0807) SpO2:  [0 %-100 %] 100 % (08/09 0807) Weight:  [198 lb 6.6 oz (90 kg)] 198 lb 6.6 oz (90 kg) (08/09 0600) Last BM Date: 10/10/15 (bowel pattern is within norm per pt )  Intake/Output from previous day: 08/08 0701 - 08/09 0700 In: 572.5 [P.O.:240; I.V.:332.5] Out: 2900 [Urine:2900] Intake/Output this shift: No intake/output data recorded.  Medications . amLODipine  10 mg Oral Daily  . antiseptic oral rinse  7 mL Mouth Rinse q12n4p  . aspirin EC  81 mg Oral Daily  . bivalirudin (ANGIOMAX) infusion 5 mg/mL (Cath Lab,ACS,PCI indication)  1.75 mg/kg/hr Intravenous Once  . chlorhexidine  15 mL Mouth Rinse BID  . heparin  5,000 Units Subcutaneous Q8H  . hydrALAZINE  37.5 mg Oral Q8H  . insulin aspart  0-15 Units Subcutaneous TID WC  . insulin aspart  0-5 Units Subcutaneous QHS  . insulin glargine  50 Units Subcutaneous QHS  . isosorbide mononitrate  60 mg Oral Daily  . metoprolol succinate  50 mg Oral BID  . potassium chloride  40 mEq Oral Daily  . ranolazine  500 mg Oral BID  . rosuvastatin  40 mg Oral q1800  . sodium chloride flush  3 mL Intravenous Q12H  . sodium chloride flush  3 mL Intravenous Q12H    . ticagrelor  90 mg Oral BID  . torsemide  60 mg Oral Daily     PE: General appearance: alert, cooperative and no distress Neck: no carotid bruit and no JVD Lungs: clear to auscultation bilaterally Heart: regular rate and rhythm, S1, S2 normal, no murmur, click, rub or gallop Extremities: trace bilateral LEE, R>L Pulses: 2+ and symmetric Skin: warm and dry Neurologic: Grossly normal  Lab Results:   Recent Labs  10/14/15 0408  WBC 6.2  HGB 10.5*  HCT 34.2*  PLT 182   BMET  Recent Labs  10/12/15 0623 10/13/15 0616 10/14/15 0408  NA 138 137 139  K 3.7 3.8 3.9  CL 100* 101 104  CO2 28 26 24   GLUCOSE 250* 211* 262*  BUN 43* 44* 31*  CREATININE 1.82* 1.65* 1.49*  CALCIUM 8.9 8.9 9.2   PT/INR  Recent Labs  10/13/15 1415  LABPROT 15.1  INR 1.18   Cardiac Panel (last 3 results)  Recent Labs  10/12/15 0621 10/13/15 1800  TROPONINI 0.54* 0.23*    Studies/Results: Procedures   Left Heart Cath and Coronary Angiography  Conclusion     Mid LAD lesion, 60 %stenosed.  Prox Cx to Mid Cx lesion, 99 %stenosed.  Ost Cx to Prox Cx lesion, 75 %stenosed.  Mid RCA lesion, 0 %stenosed.  A drug eluting .  Ost RPDA lesion, 85 %stenosed.  Post intervention, there is a 0% residual stenosis.  A stent was successfully placed.  Dist RCA lesion, 85 %stenosed.  Post intervention, there is a 0% residual stenosis.  A stent was successfully placed.  Post Atrio lesion, 85 %stenosed.     2D Echo- pending    Assessment/Plan  Principal Problem:   Acute on chronic combined systolic and diastolic CHF (congestive heart failure) (HCC) Active Problems:   Overweight   Essential hypertension   Atherosclerosis of native coronary artery of native heart with unstable angina pectoris (HCC)   Type 2 diabetes mellitus with stage 4 chronic kidney disease (HCC)   NSTEMI (non-ST elevated myocardial infarction) ([place])   Acute respiratory failure with hypercapnia ([place])    AKI (acute kidney injury) ([place])  1. NSTEMI/CAD: LHC 10/13/15 demonstrated three-vessel coronary obstructive disease with luminal irregularities of the LAD with 50-60% smooth eccentric LAD stenosis after the first septal perforating artery; no change in the previously noted chronic subtotal occlusion of the left circumflex vessel with 75-80% stenoses  beyond the left main and diffuse 99-100% in-stent narrowing with faint TIMI 2 flow distal to the stented segment; and a moderate size RCA with a widely patent stent in the mid RCA proximal to the acute margin and 85% in-stent restenosis in the previously placed 2.514 mm stent extending into the PDA ostium with a jailed small continuation branch of the distal RCA with 85% ostial narrowing in this jailed segment.  -- Patient underwent successful PCI with Angiosculpt scoring balloon in the 85% in-stent restenosis in the distal RCA stent extending into the PDA vessel with the 85% stenosis being reduced to 0% and no change in the jailed small branch.   -- Patient is CP free. No recurrent dyspnea. Continue DAPT w/ ASA and Brilinta, + metoprolol, Crestor and Imdur. Ranexa 500 mg BID was added to her regimen. Can further titrate to 1000 mg BID if needed. Per Dr. Claiborne Billings, If she develops recurrent symptomatology now that her RCA is opened consideration for possible attempt at opening her chronic subtotal/total occlusion may be warranted.  2. Acute on Chronic Combined Systolic + Diastolic CHF: Dyspnea has resolved. Trace LEE on the right.  Good UOP. -2.3 L out in past 24 hrs. I/Os net negative 7.3L since admit. Continue PO torsemide, 60 mg daily.  Scr downtrending, 1.82>>1.65>>1.49. K is WNL at 3.9. 2D echo pending.   3. T2DM: elevated FBG levels >200. Management per IM.   4. HTN: BP is well controlled on current regimen.   5. HLD: continue Crestor.    LOS: 4 days   Brittainy M. Ladoris Gene 10/14/2015 8:58 AM  The patient was seen, examined and discussed with  Brittainy M. Rosita Fire, PA-C and I agree with the above.   70 year old female with known CAD, s/p NSTEMI in 06/2015 treated with medical therapy (TCO of LCX),  Readmitted with NSTEMI, LHC showed 3 VD, with 85% in-stent restenosis in the previously placed RCA stent extending into the PDA ostium, s/p successful PCI with Angiosculpt scoring balloon, stenosis being reduced to 0% and no change in the jailed small branch. The patient is symptoms free this am.  Continue DAPT w/ ASA and Brilinta, + metoprolol, Crestor and Imdur. Ranexa 500 mg BID was added to her regimen. Can further titrate to 1000 mg BID if needed. Per Dr. Claiborne Billings, If she develops recurrent symptomatology now that her RCA is opened consideration for possible attempt at opening her chronic subtotal/total occlusion may be warranted. The patient is  almost euvolemic, improved dyspnea post stent placement. Negative 7 L since the admit. Continue PO torsemide, 60 mg daily.  Scr downtrending, 1.82>>1.65>>1.49. K is WNL at 3.9. 2D echo pending.  Discharge home today after the echo is done.  Ena Dawley 10/14/2015

## 2015-10-14 NOTE — Progress Notes (Signed)
CARDIAC REHAB PHASE I   PRE:  Rate/Rhythm: 72 SR    BP: sitting 150/67    SaO2:   MODE:  Ambulation: 300 ft   POST:  Rate/Rhythm: 87 SR    BP: sitting 140/79     SaO2:   Pt with slow pace, no c/o except weakness from bedrest. To recliner. Ed completed/reviewed. Pt did PT instead of CRPII so will send referral to [facility] again.  [place], ACSM 10/14/2015 9:44 AM

## 2015-10-14 NOTE — Progress Notes (Signed)
  Echocardiogram 2D Echocardiogram with Definity has been performed.  [name] 10/14/2015, 11:20 AM

## 2015-10-14 NOTE — Progress Notes (Signed)
Benefit check  Ranexa 500mg  bid Pt has a $0 copay- prior auth is not required for this pt

## 2015-10-14 NOTE — Discharge Summary (Signed)
Discharge Summary    Patient ID: [name],  MRN: [id number], DOB/AGE: 71-Jun-1947 71 y.o.  Admit date: 10/10/2015 Discharge date: 10/14/2015  Primary Care Provider: [facility] Primary Cardiologist: Dr. Bronson Ing   Discharge Diagnoses    Principal Problem:   Acute on chronic combined systolic and diastolic CHF (congestive heart failure) ([place]) Active Problems:   Overweight   Essential hypertension   Atherosclerosis of native coronary artery of native heart with unstable angina pectoris (HCC)   Type 2 diabetes mellitus with stage 4 chronic kidney disease (HCC)   NSTEMI (non-ST elevated myocardial infarction) ([place])   Acute respiratory failure with hypercapnia (HCC)   AKI (acute kidney injury) ([place])   Allergies Allergies  Allergen Reactions  . Ace Inhibitors Cough    Pt can tolerate Tribenzor (and ARBs)  . Codeine Rash    Diagnostic Studies/Procedures    Left Heart Cath and Coronary Angiography  Conclusion     Mid LAD lesion, 60 %stenosed.  Prox Cx to Mid Cx lesion, 99 %stenosed.  Ost Cx to Prox Cx lesion, 75 %stenosed.  Mid RCA lesion, 0 %stenosed.  A drug eluting .  Ost RPDA lesion, 85 %stenosed.  Post intervention, there is a 0% residual stenosis.  A stent was successfully placed.  Dist RCA lesion, 85 %stenosed.  Post intervention, there is a 0% residual stenosis.  A stent was successfully placed.  Post Atrio lesion, 85 %stenosed.   2D Echo 10/14/15 Study Conclusions  - Left ventricle: The cavity size was normal. There was moderate   focal basal hypertrophy of the septum with mild hypertrophy of   the posterior wall. Systolic function was mildly to moderately   reduced. The estimated ejection fraction was in the range of 40%   to 45%. Hypokinesis of the anterior, anterolateral, and   inferolateral myocardium. Features are consistent with a   pseudonormal left ventricular filling pattern, with concomitant   abnormal  relaxation and increased filling pressure (grade 2   diastolic dysfunction). - Aortic valve: Transvalvular velocity was within the normal range.   There was no stenosis. There was no regurgitation. - Mitral valve: Transvalvular velocity was within the normal range.   There was no evidence for stenosis. There was mild regurgitation. - Left atrium: The atrium was severely dilated. - Right ventricle: The cavity size was normal. Wall thickness was   normal. Systolic function was normal. - Inferior vena cava: The vessel was dilated. The respirophasic   diameter changes were blunted (< 50%), consistent with elevated   central venous pressure.    History of Present Illness     70 y.o. femalehistory of CAD with NSTEMI 06/2015 with DES to PDA and RCA at that time. She had a subtotal occlusion of LCX that was treated medically, with recs if viability shown could consider intervention. Chronic systolic HF LVEF [id number], grade II diastolic dysfunction, right subclavian stenosis s/p intervention, DM2, HTN, HL, CKD IV admitted with SOB. Initially required bipap in [facility] ER. She ruled in for NSTEMI and was transferred to [facility] 10/13/15 for [place].   Hospital Course    On arrival to [facility], patient underwent LHC. Troponin peaked at 0.54. Procedure was performed by Dr. Claiborne Billings. She was found to have three-vessel coronary obstructive disease with luminal irregularities of the LAD with 50-60% smooth eccentric LAD stenosis after the first septal perforating artery; no change in the previously noted chronic subtotal occlusion of the left circumflex vessel with 75-80% stenoses beyond the left  main and diffuse 99-100% in-stent narrowing with faint TIMI 2 flow distal to the stented segment; and a moderate size RCA with a widely patent stent in the mid RCA proximal to the acute margin and 85% in-stent restenosis in the previously placed 2.514 mm stent extending into the PDA ostium with a jailed small continuation  branch of the distal RCA with 85% ostial narrowing in this jailed segment. Patient underwent successful PCI with Angiosculpt scoring balloon in the 85% in-stent restenosis in the distal RCA stent extending into the PDA vessel with the 85% stenosis being reduced to 0% and no change in the jailed small branch. She tolerated the procedure well and left the cath lab in stable condition. She had no CP and no recurrent dyspnea. She was placed on DAPT with ASA and Brilinta, + metoprolol, Crestor and Imdur. Dr. Harl Bowie changed her BB from lopressor to Toprol XL, 50 mg BID given diastolic dysfunction. Ranexa 500 mg BID was added to her regimen.  2D echo was obtained prior to discharge. EF was mildly reduced at 40-45%. Grade 2DD was noted. Hypokinesis of the anterior, anterolateral, and inferolateral myocardium was noted. Her volume was stable at time of discharge. She was continued on PO Torsemide. Her dose was increased from 20 mg BID to 60 mg daily. She had no difficulties ambulating with cardiac rehab. Femoral cath site was stable as well as renal function. She was last seen and examined by Dr. Meda Coffee who determined she was stable for d/c home. She has f/u with Dr. Bronson Ing 10/20/15 in [place]. Per Dr. Claiborne Billings, If she develops recurrent symptomatology now that her RCA is opened consideration for possible attempt at opening her chronic subtotal/total occlusion may be warranted.    Consultants: nephrology     Discharge Vitals Blood pressure (!) 126/53, pulse 63, temperature 98 F (36.7 C), temperature source Oral, resp. rate 17, height 5\' 10"  (1.778 m), weight 198 lb 6.6 oz (90 kg), SpO2 100 %.  Filed Weights   10/12/15 0443 10/13/15 0701 10/14/15 0600  Weight: 201 lb 11.2 oz (91.5 kg) 201 lb 4.8 oz (91.3 kg) 198 lb 6.6 oz (90 kg)    Labs & Radiologic Studies    CBC  Recent Labs  10/14/15 0408  WBC 6.2  HGB 10.5*  HCT 34.2*  MCV 103.0*  PLT Q000111Q   Basic Metabolic Panel  Recent Labs   10/12/15 0623 10/13/15 0616 10/14/15 0408  NA 138 137 139  K 3.7 3.8 3.9  CL 100* 101 104  CO2 28 26 24   GLUCOSE 250* 211* 262*  BUN 43* 44* 31*  CREATININE 1.82* 1.65* 1.49*  CALCIUM 8.9 8.9 9.2  PHOS 2.6 2.8  --    Liver Function Tests  Recent Labs  10/12/15 0623 10/13/15 0616  ALBUMIN 3.5 3.4*   No results for input(s): LIPASE, AMYLASE in the last 72 hours. Cardiac Enzymes  Recent Labs  10/12/15 0621 10/13/15 1800  TROPONINI 0.54* 0.23*   BNP Invalid input(s): POCBNP D-Dimer No results for input(s): DDIMER in the last 72 hours. Hemoglobin A1C No results for input(s): HGBA1C in the last 72 hours. Fasting Lipid Panel No results for input(s): CHOL, HDL, LDLCALC, TRIG, CHOLHDL, LDLDIRECT in the last 72 hours. Thyroid Function Tests No results for input(s): TSH, T4TOTAL, T3FREE, THYROIDAB in the last 72 hours.  Invalid input(s): FREET3 _____________  Dg Chest Port 1 View  Result Date: 10/10/2015 CLINICAL DATA:  70 year old female with shortness of breath EXAM: PORTABLE CHEST 1 VIEW COMPARISON:  Chest radiograph dated 09/28/2015 FINDINGS: There is shallow inspiration of the lungs. Bilateral scratch the diffuse bilateral airspace hazy density and interstitial prominence. Findings may represent pulmonary edema, ARDS, or pneumonia. Clinical correlation is recommended. There is silhouetting of the diaphragm as well as silhouetting of the cardiac borders. No significant pleural effusion. No pneumothorax. No acute osseous pathology. IMPRESSION: Diffuse hazy airspace density predominantly involving the mid to lower lung fields with interval progression compared to prior study. Findings likely represent pulmonary edema, ARDS, or pneumonia. Clinical correlation and follow-up recommended. Electronically Signed   By: Anner Crete M.D.   On: 10/10/2015 04:49   Disposition   Pt is being discharged home today in good condition.  Follow-up Plans & Appointments    Follow-up  [place], MD Follow up on 10/20/2015.   Specialty:  Cardiology Why:  4:00 PM  Contact information: [place] [id number] [phone]          Discharge Instructions    Amb Referral to Cardiac Rehabilitation    Complete by:  As directed   Diagnosis:   PTCA NSTEMI     Diet - low sodium heart healthy    Complete by:  As directed   Increase activity slowly    Complete by:  As directed      Discharge Medications   Current Discharge Medication List    START taking these medications   Details  metoprolol succinate (TOPROL-XL) 50 MG 24 hr tablet Take 1 tablet (50 mg total) by mouth 2 (two) times daily. Take with or immediately following a meal. Qty: 60 tablet, Refills: 5    potassium chloride SA (K-DUR,KLOR-CON) 20 MEQ tablet Take 2 tablets (40 mEq total) by mouth daily. Qty: 60 tablet, Refills: 5    ranolazine (RANEXA) 500 MG 12 hr tablet Take 1 tablet (500 mg total) by mouth 2 (two) times daily. Qty: 60 tablet, Refills: 5      CONTINUE these medications which have CHANGED   Details  nitroGLYCERIN (NITROSTAT) 0.4 MG SL tablet Place 1 tablet (0.4 mg total) under the tongue every 5 (five) minutes as needed for chest pain. Qty: 25 tablet, Refills: 2    !! ticagrelor (BRILINTA) 90 MG TABS tablet Take 1 tablet (90 mg total) by mouth 2 (two) times daily. Qty: 60 tablet, Refills: 10    !! ticagrelor (BRILINTA) 90 MG TABS tablet Take 1 tablet (90 mg total) by mouth 2 (two) times daily. Qty: 60 tablet, Refills: 0    torsemide (DEMADEX) 20 MG tablet Take 3 tablets (60 mg total) by mouth daily. Qty: 90 tablet, Refills: 5     !! - Potential duplicate medications found. Please discuss with provider.    CONTINUE these medications which have NOT CHANGED   Details  acetaminophen (TYLENOL) 500 MG tablet Take 2 tablets (1,000 mg total) by mouth every 6 (six) hours as needed (pain). Qty: 30 tablet, Refills: 0    ALPRAZolam (XANAX) 0.5 MG  tablet Take 0.5 mg by mouth at bedtime as needed for sleep.     amLODipine (NORVASC) 10 MG tablet Take 1 tablet (10 mg total) by mouth daily. Qty: 30 tablet, Refills: 6    aspirin EC 81 MG tablet Take 81 mg by mouth daily.    benzonatate (TESSALON) 100 MG capsule Take 100 mg by mouth daily as needed for cough.    calcitRIOL (ROCALTROL) 0.25 MCG capsule Take 0.25 mcg by mouth every Monday, Wednesday, and  Friday.     famotidine (PEPCID) 20 MG tablet Take 20 mg by mouth daily.    ferrous sulfate 325 (65 FE) MG tablet Take 325 mg by mouth daily.    gabapentin (NEURONTIN) 100 MG capsule Take 100 mg by mouth daily as needed (pain).     glucose blood (ACCU-CHEK AVIVA) test strip Use to check blood glucose 4 times a day. E11.65 Qty: 150 each, Refills: 5    hydrALAZINE (APRESOLINE) 25 MG tablet Take 1.5 tablets (37.5 mg total) by mouth every 8 (eight) hours. Qty: 90 tablet, Refills: 0    Insulin Degludec (TRESIBA FLEXTOUCH) 100 UNIT/ML SOPN Inject 50 Units into the skin at bedtime.    insulin lispro (HUMALOG KWIKPEN) 100 UNIT/ML KiwkPen Inject 0.15-0.21 mLs (15-21 Units total) into the skin 3 (three) times daily. Qty: 15 mL, Refills: 2    isosorbide mononitrate (IMDUR) 60 MG 24 hr tablet Take 1 tablet (60 mg total) by mouth daily. Qty: 30 tablet, Refills: 0    latanoprost (XALATAN) 0.005 % ophthalmic solution Place 1 drop into both eyes at bedtime.     meclizine (ANTIVERT) 12.5 MG tablet Take 12.5 mg by mouth 2 (two) times daily.     Multiple Vitamin (MULTIVITAMIN WITH MINERALS) TABS tablet Take 1 tablet by mouth daily.    rosuvastatin (CRESTOR) 40 MG tablet Take 1 tablet (40 mg total) by mouth at bedtime. Qty: 30 tablet, Refills: 0    trimethoprim (TRIMPEX) 100 MG tablet Take 100 mg by mouth daily.       STOP taking these medications     metoprolol (LOPRESSOR) 50 MG tablet          Aspirin prescribed at discharge?  Yes High Intensity Statin Prescribed? (Lipitor 40-80mg  or  Crestor 20-40mg ): Yes Beta Blocker Prescribed? Yes For EF <40%, was ACEI/ARB Prescribed? No CKD (on nitrate + hydralazine) ADP Receptor Inhibitor Prescribed? (i.e. Plavix etc.-Includes Medically Managed Patients): Yes For EF <40%, Aldosterone Inhibitor Prescribed? No: EF >40% Was EF assessed during THIS hospitalization? Yes Was Cardiac Rehab II ordered? (Included Medically managed Patients): Yes   Outstanding Labs/Studies   None   Duration of Discharge Encounter   Greater than 30 minutes including physician time.  Signed, Lyda Jester PA-C 10/14/2015, 12:31 PM   [name] 10/14/2015

## 2015-10-15 NOTE — Care Management Note (Signed)
Case Management Note  Patient Details  Name: [name] MRN: [id number] Date of Birth: Jul 23, 1945  Subjective/Objective:  Patient is from home with spouse, pta indep,  Per benefit check ranexa 500mg  has $0 co pay, no prior auth.  Patient  for dc, no needs.                Action/Plan:   Expected Discharge Date:                  Expected Discharge Plan:  Home/Self Care  In-House Referral:  NA  Discharge planning Services  CM Consult  Post Acute Care Choice:  NA Choice offered to:     DME Arranged:    DME Agency:     HH Arranged:    HH Agency:     Status of Service:  Completed, signed off  If discussed at [employer] of Stay Meetings, dates discussed:    Additional Comments:  Zenon Mayo, RN 10/15/2015, 3:31 PM

## 2015-10-20 ENCOUNTER — Ambulatory Visit: Payer: Medicare Other | Admitting: Cardiovascular Disease

## 2015-10-20 ENCOUNTER — Other Ambulatory Visit: Payer: Self-pay

## 2015-10-20 MED ORDER — INSULIN LISPRO 100 UNIT/ML (KWIKPEN): 15.0000 [IU] | PEN_INJECTOR | Freq: Three times a day (TID) | SUBCUTANEOUS | 2 refills | Status: DC

## 2015-10-21 ENCOUNTER — Other Ambulatory Visit: Payer: Self-pay

## 2015-10-21 MED ORDER — INSULIN ASPART 100 UNIT/ML FLEXPEN: 15.0000 [IU] | PEN_INJECTOR | Freq: Three times a day (TID) | SUBCUTANEOUS | 2 refills | Status: DC

## 2015-11-11 ENCOUNTER — Ambulatory Visit: Payer: Medicare Other | Admitting: "Endocrinology

## 2015-11-17 ENCOUNTER — Encounter: Payer: Self-pay | Admitting: *Deleted

## 2015-11-17 ENCOUNTER — Ambulatory Visit (INDEPENDENT_AMBULATORY_CARE_PROVIDER_SITE_OTHER): Payer: Medicare Other | Admitting: Cardiovascular Disease

## 2015-11-17 ENCOUNTER — Encounter: Payer: Self-pay | Admitting: Cardiovascular Disease

## 2015-11-17 VITALS — BP 150/60 | HR 64 | Ht 71.0 in | Wt 205.0 lb

## 2015-11-17 DIAGNOSIS — Z87898 Personal history of other specified conditions: Secondary | ICD-10-CM

## 2015-11-17 DIAGNOSIS — I5042 Chronic combined systolic (congestive) and diastolic (congestive) heart failure: Secondary | ICD-10-CM | POA: Diagnosis not present

## 2015-11-17 DIAGNOSIS — I748 Embolism and thrombosis of other arteries: Secondary | ICD-10-CM

## 2015-11-17 DIAGNOSIS — Z9289 Personal history of other medical treatment: Secondary | ICD-10-CM

## 2015-11-17 DIAGNOSIS — I739 Peripheral vascular disease, unspecified: Secondary | ICD-10-CM | POA: Diagnosis not present

## 2015-11-17 DIAGNOSIS — Z955 Presence of coronary angioplasty implant and graft: Secondary | ICD-10-CM | POA: Diagnosis not present

## 2015-11-17 DIAGNOSIS — I10 Essential (primary) hypertension: Secondary | ICD-10-CM

## 2015-11-17 DIAGNOSIS — E785 Hyperlipidemia, unspecified: Secondary | ICD-10-CM

## 2015-11-17 DIAGNOSIS — I779 Disorder of arteries and arterioles, unspecified: Secondary | ICD-10-CM

## 2015-11-17 MED ORDER — TORSEMIDE 20 MG PO TABS: ORAL_TABLET | ORAL | Status: DC

## 2015-11-17 NOTE — Patient Instructions (Signed)

## 2015-11-17 NOTE — Progress Notes (Signed)
SUBJECTIVE: Pt presents for routine follow up. Underwent PCI of 85% in-stent restenosis of distal RCA extending into PDA 10/13/15. Has a chronic subtotal LCx in-stent restenosis as well. If symptoms were to recur, thought was given to opening CTO. Ranexa added to regimen.  Echo 10/14/15 LVEF A999333, grade 2 diastolic dysfunction.  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  Her ischemic symptoms are back pain and SOB, both of which she denies at present. Only has mild exertional dyspnea, no chest pain.  Torsemide dose recently increased by nephrologist. BP at their office Q000111Q systolic.   Review of Systems: As per "subjective", otherwise negative.  Allergies  Allergen Reactions  . Ace Inhibitors Cough    Pt can tolerate Tribenzor (and ARBs)  . Codeine Rash    Current Outpatient Prescriptions  Medication Sig Dispense Refill  . acetaminophen (TYLENOL) 500 MG tablet Take 2 tablets (1,000 mg total) by mouth every 6 (six) hours as needed (pain). 30 tablet 0  . ALPRAZolam (XANAX) 0.5 MG tablet Take 0.5 mg by mouth at bedtime as needed for sleep.     Marland Kitchen amLODipine (NORVASC) 10 MG tablet Take 1 tablet (10 mg total) by mouth daily. 30 tablet 6  . aspirin EC 81 MG tablet Take 81 mg by mouth daily.    . benzonatate (TESSALON) 100 MG capsule Take 100 mg by mouth daily as needed for cough.    . calcitRIOL (ROCALTROL) 0.25 MCG capsule Take 0.25 mcg by mouth every Monday, Wednesday, and Friday.     . famotidine (PEPCID) 20 MG tablet Take 20 mg by mouth daily.    . ferrous sulfate 325 (65 FE) MG tablet Take 325 mg by mouth daily.    Marland Kitchen gabapentin (NEURONTIN) 100 MG capsule Take 100 mg by mouth daily as needed (pain).     Marland Kitchen glucose blood (ACCU-CHEK AVIVA) test strip Use to check blood glucose 4 times a day. E11.65 150 each 5  . hydrALAZINE  (APRESOLINE) 25 MG tablet Take 1.5 tablets (37.5 mg total) by mouth every 8 (eight) hours. 90 tablet 0  . insulin aspart (NOVOLOG FLEXPEN) 100 UNIT/ML FlexPen Inject 15-21 Units into the skin 3 (three) times daily with meals. 15 mL 2  . Insulin Degludec (TRESIBA FLEXTOUCH) 100 UNIT/ML SOPN Inject 50 Units into the skin at bedtime.    . insulin lispro (HUMALOG KWIKPEN) 100 UNIT/ML KiwkPen Inject 0.15-0.21 mLs (15-21 Units total) into the skin 3 (three) times daily. 15 mL 2  . isosorbide mononitrate (IMDUR) 60 MG 24 hr tablet Take 1 tablet (60 mg total) by mouth daily. 30 tablet 0  . latanoprost (XALATAN) 0.005 % ophthalmic solution Place 1 drop into both eyes at bedtime.     . meclizine (ANTIVERT) 12.5 MG tablet Take 12.5 mg by mouth 2 (two) times daily.     . metoprolol succinate (TOPROL-XL) 50 MG 24 hr tablet Take 1 tablet (50 mg total) by mouth 2 (two) times daily. Take with or immediately following a meal. 60 tablet 5  . Multiple Vitamin (MULTIVITAMIN WITH MINERALS) TABS tablet Take 1 tablet by mouth daily.    . nitroGLYCERIN (NITROSTAT) 0.4 MG SL tablet Place 1 tablet (0.4 mg total) under the tongue every 5 (five) minutes as needed for chest pain. 25 tablet 2  . potassium chloride SA (K-DUR,KLOR-CON) 20 MEQ tablet Take 2 tablets (40 mEq  total) by mouth daily. 60 tablet 5  . ranolazine (RANEXA) 500 MG 12 hr tablet Take 1 tablet (500 mg total) by mouth 2 (two) times daily. 60 tablet 5  . rosuvastatin (CRESTOR) 40 MG tablet Take 1 tablet (40 mg total) by mouth at bedtime. 30 tablet 0  . ticagrelor (BRILINTA) 90 MG TABS tablet Take 1 tablet (90 mg total) by mouth 2 (two) times daily. 60 tablet 0  . torsemide (DEMADEX) 20 MG tablet Take 3 tablets (60 mg total) by mouth daily. 90 tablet 5  . trimethoprim (TRIMPEX) 100 MG tablet Take 100 mg by mouth daily.      No current facility-administered medications for this visit.     Past Medical History:  Diagnosis Date  . Acute CHF (congestive heart  failure) ([place]) 07/04/2015  . Acute renal failure superimposed on stage 4 chronic kidney disease ([place]) 07/04/2015  . Allergic rhinitis   . Anxiety   . Constipation   . Diabetes mellitus   . GERD (gastroesophageal reflux disease)   . Heart murmur, systolic   . History of hysterectomy   . Hyperlipidemia   . Hypertension   . Low back pain   . Obesity   . Osteoarthritis   . Overactive bladder     Past Surgical History:  Procedure Laterality Date  . ABDOMINAL HYSTERECTOMY    . BACK SURGERY     multiple  . CARDIAC CATHETERIZATION  04/04/2006   Est EF of 60%  . CARDIAC CATHETERIZATION N/A 07/04/2015   Procedure: Left Heart Cath and Coronary Angiography;  Surgeon: Troy Sine, MD;  Location: [place] [facility];  Service: Cardiovascular;  Laterality: N/A;  . CARDIAC CATHETERIZATION N/A 07/04/2015   Procedure: Coronary Stent Intervention;  Surgeon: Troy Sine, MD;  Location: [place] [facility];  Service: Cardiovascular;  Laterality: N/A;  . CARDIAC CATHETERIZATION N/A 10/13/2015   Procedure: Left Heart Cath and Coronary Angiography;  Surgeon: Troy Sine, MD;  Location: [place] [facility];  Service: Cardiovascular;  Laterality: N/A;  . CARPAL TUNNEL RELEASE    . CERVICAL BIOPSY     cervical lymph node biopsies  . COLONOSCOPY  May 2002   Dr. Irving Shows :Followup in 5 years, normal exam  . COLONOSCOPY  2008   Dr. Laural Golden: Very redundant colon with mild melanosis coli, splenic flexure polyp biopsy with acute complaint of benign colon polyp. Recommended ten-year followup  . CORONARY STENT PLACEMENT  04/11/2006   2 -- Taxus stents to the circumflex   . PERIPHERAL VASCULAR CATHETERIZATION Right 07/09/2015   Procedure: Upper Extremity Angiography;  Surgeon: Conrad Mobile, MD;  Location: [place] [facility];  Service: Cardiovascular;  Laterality: Right;  . PERIPHERAL VASCULAR CATHETERIZATION Right 07/10/2015   Procedure: RIGHT SUBCLAVIAN ARTERY THROMBECTOMY;  Surgeon: Serafina Mitchell, MD;   Location: [facility];  Service: Vascular;  Laterality: Right;  . tendonitis     bilateral elbow  . TRIGGER FINGER RELEASE      Social History   Social History  . Marital status: Married    Spouse name: N/A  . Number of children: 3  . Years of education: N/A   Occupational History  . Not on file.   Social History Main Topics  . Smoking status: Never Smoker  . Smokeless tobacco: Never Used  . Alcohol use No  . Drug use: No  . Sexual activity: Not on file   Other Topics Concern  . Not on file   Social History  Narrative  . No narrative on file     Vitals:   11/17/15 1347  BP: (!) 150/60  Pulse: 64  SpO2: 97%  Weight: 205 lb (93 kg)  Height: 5\' 11"  (1.803 m)    PHYSICAL EXAM General: NAD HEENT: Normal. Neck: No JVD, no thyromegaly. Lungs: Clear to auscultation bilaterally with normal respiratory effort. CV: Nondisplaced PMI.  Regular rate and rhythm, normal S1/S2, no [id number], soft systolic murmur over LUSB. No pretibial or periankle edema.     Abdomen: Soft, obese.  Neurologic: Alert and oriented.  Psych: Normal affect. Skin: Normal.    ECG: Most recent ECG reviewed.      ASSESSMENT AND PLAN: 1. CAD s/p NSTEMI with PDA and RCA stenting and CTO of left circumflex with RCA in-stent restenosis s/p PCI 10/2015: Symptomatically stable. Continue dual antiplatelet therapy indefinitely with aspirin and Brilinta. Continue metoprolol, nitrates, Ranexa, and Crestor.   2. Essential HTN: Elevated but had been rushing. Normal last week. Will monitor.  3. Hyperlipidemia: Continue Crestor 40 mg.  4. Bilateral carotid artery stenosis: Mild. Will repeat in 2 years. Continue aspirin and statin.  5. 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  6. Chronic systolic heart failure: Euvolemic. Continue torsemide.  Dispo: fu 6 months.  Time spent: 40 minutes, of which greater than 50% was spent reviewing symptoms, relevant  blood tests and studies, and discussing management plan with the patient.   Kate Sable, M.D., F.A.C.C.

## 2015-11-23 ENCOUNTER — Encounter: Payer: Self-pay | Admitting: "Endocrinology

## 2015-11-23 ENCOUNTER — Other Ambulatory Visit: Payer: Self-pay

## 2015-11-23 ENCOUNTER — Ambulatory Visit (INDEPENDENT_AMBULATORY_CARE_PROVIDER_SITE_OTHER): Payer: Medicare Other | Admitting: "Endocrinology

## 2015-11-23 VITALS — BP 124/73 | HR 60 | Ht 71.0 in | Wt 206.0 lb

## 2015-11-23 DIAGNOSIS — E785 Hyperlipidemia, unspecified: Secondary | ICD-10-CM

## 2015-11-23 DIAGNOSIS — E1122 Type 2 diabetes mellitus with diabetic chronic kidney disease: Secondary | ICD-10-CM | POA: Diagnosis not present

## 2015-11-23 DIAGNOSIS — N184 Chronic kidney disease, stage 4 (severe): Secondary | ICD-10-CM

## 2015-11-23 DIAGNOSIS — I748 Embolism and thrombosis of other arteries: Secondary | ICD-10-CM

## 2015-11-23 DIAGNOSIS — Z794 Long term (current) use of insulin: Secondary | ICD-10-CM | POA: Diagnosis not present

## 2015-11-23 DIAGNOSIS — I10 Essential (primary) hypertension: Secondary | ICD-10-CM | POA: Diagnosis not present

## 2015-11-23 MED ORDER — INSULIN DEGLUDEC 100 UNIT/ML ~~LOC~~ SOPN: 30.0000 [IU] | PEN_INJECTOR | Freq: Every day | SUBCUTANEOUS | 2 refills | Status: DC

## 2015-11-23 MED ORDER — INSULIN LISPRO 100 UNIT/ML (KWIKPEN): 10.0000 [IU] | PEN_INJECTOR | Freq: Three times a day (TID) | SUBCUTANEOUS | 11 refills | Status: DC

## 2015-11-23 MED ORDER — INSULIN ASPART 100 UNIT/ML FLEXPEN: 10.0000 [IU] | PEN_INJECTOR | Freq: Three times a day (TID) | SUBCUTANEOUS | 2 refills | Status: DC

## 2015-11-23 NOTE — Progress Notes (Signed)
Subjective:    Patient ID: [name], female    DOB: Sep 02, 1945,    Past Medical History:  Diagnosis Date  . Acute CHF (congestive heart failure) ([place]) 07/04/2015  . Acute renal failure superimposed on stage 4 chronic kidney disease ([place]) 07/04/2015  . Allergic rhinitis   . Anxiety   . Constipation   . Diabetes mellitus   . GERD (gastroesophageal reflux disease)   . Heart murmur, systolic   . History of hysterectomy   . Hyperlipidemia   . Hypertension   . Low back pain   . Obesity   . Osteoarthritis   . Overactive bladder    Past Surgical History:  Procedure Laterality Date  . ABDOMINAL HYSTERECTOMY    . BACK SURGERY     multiple  . CARDIAC CATHETERIZATION  04/04/2006   Est EF of 60%  . CARDIAC CATHETERIZATION N/A 07/04/2015   Procedure: Left Heart Cath and Coronary Angiography;  Surgeon: Troy Sine, MD;  Location: [facility];  Service: Cardiovascular;  Laterality: N/A;  . CARDIAC CATHETERIZATION N/A 07/04/2015   Procedure: Coronary Stent Intervention;  Surgeon: Troy Sine, MD;  Location: [facility];  Service: Cardiovascular;  Laterality: N/A;  . CARDIAC CATHETERIZATION N/A 10/13/2015   Procedure: Left Heart Cath and Coronary Angiography;  Surgeon: Troy Sine, MD;  Location: [facility];  Service: Cardiovascular;  Laterality: N/A;  . CARPAL TUNNEL RELEASE    . CERVICAL BIOPSY     cervical lymph node biopsies  . COLONOSCOPY  May 2002   Dr. Irving Shows :Followup in 5 years, normal exam  . COLONOSCOPY  2008   Dr. Laural Golden: Very redundant colon with mild melanosis coli, splenic flexure polyp biopsy with acute complaint of benign colon polyp. Recommended ten-year followup  . CORONARY STENT PLACEMENT  04/11/2006   2 -- Taxus stents to the circumflex   . PERIPHERAL VASCULAR CATHETERIZATION Right 07/09/2015   Procedure: Upper Extremity Angiography;  Surgeon: Conrad Roslyn, MD;  Location: [facility];  Service: Cardiovascular;   Laterality: Right;  . PERIPHERAL VASCULAR CATHETERIZATION Right 07/10/2015   Procedure: RIGHT SUBCLAVIAN ARTERY THROMBECTOMY;  Surgeon: Serafina Mitchell, MD;  Location: [facility];  Service: Vascular;  Laterality: Right;  . tendonitis     bilateral elbow  . TRIGGER FINGER RELEASE     Social History   Social History  . Marital status: Married    Spouse name: N/A  . Number of children: 3  . Years of education: N/A   Social History Main Topics  . Smoking status: Never Smoker  . Smokeless tobacco: Never Used  . Alcohol use No  . Drug use: No  . Sexual activity: Not Asked   Other Topics Concern  . None   Social History Narrative  . None   Outpatient Encounter Prescriptions as of 11/23/2015  Medication Sig  . acetaminophen (TYLENOL) 500 MG tablet Take 2 tablets (1,000 mg total) by mouth every 6 (six) hours as needed (pain).  Marland Kitchen ALPRAZolam (XANAX) 0.5 MG tablet Take 0.5 mg by mouth at bedtime as needed for sleep.   Marland Kitchen amLODipine (NORVASC) 10 MG tablet Take 1 tablet (10 mg total) by mouth daily.  Marland Kitchen aspirin EC 81 MG tablet Take 81 mg by mouth daily.  . benzonatate (TESSALON) 100 MG capsule Take 100 mg by mouth daily as needed for cough.  . calcitRIOL (ROCALTROL) 0.25 MCG capsule Take 0.25 mcg by mouth every Monday, Wednesday,  and Friday.   . famotidine (PEPCID) 20 MG tablet Take 20 mg by mouth daily.  . ferrous sulfate 325 (65 FE) MG tablet Take 325 mg by mouth daily.  Marland Kitchen gabapentin (NEURONTIN) 100 MG capsule Take 100 mg by mouth daily as needed (pain).   Marland Kitchen glucose blood (ACCU-CHEK AVIVA) test strip Use to check blood glucose 4 times a day. E11.65  . hydrALAZINE (APRESOLINE) 25 MG tablet Take 1.5 tablets (37.5 mg total) by mouth every 8 (eight) hours.  . insulin aspart (NOVOLOG FLEXPEN) 100 UNIT/ML FlexPen Inject 10-16 Units into the skin 3 (three) times daily with meals.  . insulin degludec (TRESIBA FLEXTOUCH) 100 UNIT/ML SOPN FlexTouch Pen Inject 0.3 mLs (30 Units total) into the skin at  bedtime.  . isosorbide mononitrate (IMDUR) 60 MG 24 hr tablet Take 1 tablet (60 mg total) by mouth daily.  Marland Kitchen latanoprost (XALATAN) 0.005 % ophthalmic solution Place 1 drop into both eyes at bedtime.   . meclizine (ANTIVERT) 12.5 MG tablet Take 12.5 mg by mouth 2 (two) times daily.   . metoprolol succinate (TOPROL-XL) 50 MG 24 hr tablet Take 1 tablet (50 mg total) by mouth 2 (two) times daily. Take with or immediately following a meal.  . Multiple Vitamin (MULTIVITAMIN WITH MINERALS) TABS tablet Take 1 tablet by mouth daily.  . nitroGLYCERIN (NITROSTAT) 0.4 MG SL tablet Place 1 tablet (0.4 mg total) under the tongue every 5 (five) minutes as needed for chest pain.  . potassium chloride SA (K-DUR,KLOR-CON) 20 MEQ tablet Take 2 tablets (40 mEq total) by mouth daily.  . ranolazine (RANEXA) 500 MG 12 hr tablet Take 1 tablet (500 mg total) by mouth 2 (two) times daily.  . rosuvastatin (CRESTOR) 40 MG tablet Take 1 tablet (40 mg total) by mouth at bedtime.  . ticagrelor (BRILINTA) 90 MG TABS tablet Take 1 tablet (90 mg total) by mouth 2 (two) times daily.  Marland Kitchen torsemide (DEMADEX) 20 MG tablet Take 3 tabs (60mg ) by mouth every morning & 1 tab (20mg ) mid day  . trimethoprim (TRIMPEX) 100 MG tablet Take 100 mg by mouth daily.   . [DISCONTINUED] insulin aspart (NOVOLOG FLEXPEN) 100 UNIT/ML FlexPen Inject 15-21 Units into the skin 3 (three) times daily with meals.  . [DISCONTINUED] Insulin Degludec (TRESIBA FLEXTOUCH) 100 UNIT/ML SOPN Inject 50 Units into the skin at bedtime.  . [DISCONTINUED] insulin lispro (HUMALOG KWIKPEN) 100 UNIT/ML KiwkPen Inject 0.15-0.21 mLs (15-21 Units total) into the skin 3 (three) times daily.   No facility-administered encounter medications on file as of 11/23/2015.    ALLERGIES: Allergies  Allergen Reactions  . Ace Inhibitors Cough    Pt can tolerate Tribenzor (and ARBs)  . Codeine Rash   VACCINATION STATUS:  There is no immunization history on file for this  patient.  Diabetes  She presents for her follow-up diabetic visit. She has type 2 diabetes mellitus. Her disease course has been improving. Pertinent negatives for hypoglycemia include no confusion, headaches, pallor or seizures. Associated symptoms include polydipsia and polyuria. Pertinent negatives for diabetes include no chest pain, no fatigue and no polyphagia. There are no hypoglycemic complications. Symptoms are improving. Diabetic complications include heart disease, nephropathy and retinopathy. Pertinent negatives for diabetic complications include no CVA. Risk factors for coronary artery disease include diabetes mellitus, dyslipidemia, sedentary lifestyle and hypertension. Current diabetic treatment includes insulin injections. She is compliant with treatment most of the time. She is following a generally unhealthy diet. Meal planning includes avoidance of concentrated sweets. She rarely  participates in exercise. Her home blood glucose trend is decreasing steadily. Her breakfast blood glucose range is generally 110-130 mg/dl. Her lunch blood glucose range is generally 130-140 mg/dl. Her dinner blood glucose range is generally 130-140 mg/dl. Her overall blood glucose range is 130-140 mg/dl. An ACE inhibitor/angiotensin II receptor blocker is contraindicated (allergic to ACEI). Eye exam is current.  Hypertension  This is a chronic problem. The current episode started more than 1 year ago. The problem has been gradually improving since onset. Pertinent negatives include no chest pain, headaches, palpitations or shortness of breath. Hypertensive end-organ damage includes retinopathy. There is no history of CVA.  Hyperlipidemia  This is a chronic problem. The current episode started more than 1 year ago. Pertinent negatives include no chest pain, myalgias or shortness of breath. Current antihyperlipidemic treatment includes statins.  Coronary Artery Disease  Pertinent negatives include no chest pain,  leg swelling, palpitations or shortness of breath. Risk factors include hyperlipidemia and hypertension.     Review of Systems  Constitutional: Negative for fatigue and unexpected weight change.  HENT: Negative for trouble swallowing and voice change.   Eyes: Negative for visual disturbance.  Respiratory: Negative for cough, shortness of breath and wheezing.   Cardiovascular: Negative for chest pain, palpitations and leg swelling.  Gastrointestinal: Negative for diarrhea, nausea and vomiting.  Endocrine: Positive for polydipsia and polyuria. Negative for cold intolerance, heat intolerance and polyphagia.  Musculoskeletal: Negative for arthralgias and myalgias.  Skin: Negative for color change, pallor, rash and wound.  Neurological: Negative for seizures and headaches.  Psychiatric/Behavioral: Negative for confusion and suicidal ideas.    Objective:    BP 124/73   Pulse 60   Ht 5\' 11"  (1.803 m)   Wt 206 lb (93.4 kg)   BMI 28.73 kg/m   Wt Readings from Last 3 Encounters:  11/23/15 206 lb (93.4 kg)  11/17/15 205 lb (93 kg)  10/14/15 198 lb 6.6 oz (90 kg)    Physical Exam  Constitutional: She is oriented to person, place, and time. She appears well-developed.  HENT:  Head: Normocephalic and atraumatic.  Eyes: EOM are normal.  Neck: Normal range of motion. Neck supple. No tracheal deviation present. No thyromegaly present.  Cardiovascular: Normal rate and regular rhythm.   Pulmonary/Chest: Effort normal and breath sounds normal.  Abdominal: Soft. Bowel sounds are normal. There is no tenderness. There is no guarding.  Musculoskeletal: Normal range of motion. She exhibits no edema.  Neurological: She is alert and oriented to person, place, and time. She has normal reflexes. No cranial nerve deficit. Coordination normal.  Skin: Skin is warm and dry. No rash noted. No erythema. No pallor.  Psychiatric: She has a normal mood and affect. Judgment normal.    Results for orders  placed or performed during the hospital encounter of 10/10/15  MRSA PCR Screening  Result Value Ref Range   MRSA by PCR NEGATIVE NEGATIVE  CBC with Differential  Result Value Ref Range   WBC 10.2 4.0 - 10.5 K/uL   RBC 3.74 (L) 3.87 - 5.11 MIL/uL   Hemoglobin 12.0 12.0 - 15.0 g/dL   HCT 38.5 36.0 - 46.0 %   MCV 102.9 (H) 78.0 - 100.0 fL   MCH 32.1 26.0 - 34.0 pg   MCHC 31.2 30.0 - 36.0 g/dL   RDW 16.1 (H) 11.5 - 15.5 %   Platelets 215 150 - 400 K/uL   Neutrophils Relative % 69 %   Neutro Abs 7.1 1.7 - 7.7 K/uL  Lymphocytes Relative 24 %   Lymphs Abs 2.4 0.7 - 4.0 K/uL   Monocytes Relative 5 %   Monocytes Absolute 0.5 0.1 - 1.0 K/uL   Eosinophils Relative 2 %   Eosinophils Absolute 0.2 0.0 - 0.7 K/uL   Basophils Relative 0 %   Basophils Absolute 0.0 0.0 - 0.1 K/uL  Basic metabolic panel  Result Value Ref Range   Sodium 135 135 - 145 mmol/L   Potassium 3.5 3.5 - 5.1 mmol/L   Chloride 99 (L) 101 - 111 mmol/L   CO2 25 22 - 32 mmol/L   Glucose, Bld 357 (H) 65 - 99 mg/dL   BUN 51 (H) 6 - 20 mg/dL   Creatinine, Ser 2.50 (H) 0.44 - 1.00 mg/dL   Calcium 9.3 8.9 - 10.3 mg/dL   GFR calc non Af Amer 18 (L) >60 mL/min   GFR calc Af Amer 21 (L) >60 mL/min   Anion gap 11 5 - 15  Troponin I  Result Value Ref Range   Troponin I 0.06 (HH) <0.03 ng/mL  Brain natriuretic peptide  Result Value Ref Range   B Natriuretic Peptide 335.0 (H) 0.0 - 100.0 pg/mL  Blood gas, arterial  Result Value Ref Range   FIO2 100.00    Delivery systems BILEVEL POSITIVE AIRWAY PRESSURE    Mode OXYGEN MASK    LHR 8 resp/min   Inspiratory PAP 14    Expiratory PAP 6.0    pH, Arterial 7.324 (L) 7.350 - 7.450   pCO2 arterial 53.5 (H) 35.0 - 45.0 mmHg   pO2, Arterial 98.6 80.0 - 100.0 mmHg   Bicarbonate 25.1 (H) 20.0 - 24.0 mEq/L   Acid-Base Excess 1.6 0.0 - 2.0 mmol/L   O2 Saturation 96.2 %   Collection site BRACHIAL ARTERY    Drawn by [id number]    Sample type ARTERIAL    Allens test (pass/fail) NOT  INDICATED (A) PASS  Troponin I  Result Value Ref Range   Troponin I 0.35 (HH) <0.03 ng/mL  Troponin I  Result Value Ref Range   Troponin I 1.62 (HH) <0.03 ng/mL  Troponin I  Result Value Ref Range   Troponin I 1.58 (HH) <0.03 ng/mL  TSH  Result Value Ref Range   TSH 0.743 0.350 - 4.500 uIU/mL  Glucose, capillary  Result Value Ref Range   Glucose-Capillary 366 (H) 65 - 99 mg/dL  Glucose, capillary  Result Value Ref Range   Glucose-Capillary 287 (H) 65 - 99 mg/dL  Glucose, capillary  Result Value Ref Range   Glucose-Capillary 218 (H) 65 - 99 mg/dL   Comment 1 Notify RN    Comment 2 Document in Chart   Renal function panel  Result Value Ref Range   Sodium 140 135 - 145 mmol/L   Potassium 3.7 3.5 - 5.1 mmol/L   Chloride 105 101 - 111 mmol/L   CO2 28 22 - 32 mmol/L   Glucose, Bld 61 (L) 65 - 99 mg/dL   BUN 38 (H) 6 - 20 mg/dL   Creatinine, Ser 1.66 (H) 0.44 - 1.00 mg/dL   Calcium 8.6 (L) 8.9 - 10.3 mg/dL   Phosphorus 2.7 2.5 - 4.6 mg/dL   Albumin 3.5 3.5 - 5.0 g/dL   GFR calc non Af Amer 30 (L) >60 mL/min   GFR calc Af Amer 35 (L) >60 mL/min   Anion gap 7 5 - 15  Troponin I  Result Value Ref Range   Troponin I 1.22 (HH) <0.03 ng/mL  Glucose,  capillary  Result Value Ref Range   Glucose-Capillary 171 (H) 65 - 99 mg/dL   Comment 1 Notify RN   Glucose, capillary  Result Value Ref Range   Glucose-Capillary 148 (H) 65 - 99 mg/dL   Comment 1 Notify RN   Glucose, capillary  Result Value Ref Range   Glucose-Capillary 80 65 - 99 mg/dL   Comment 1 Notify RN   Glucose, capillary  Result Value Ref Range   Glucose-Capillary 87 65 - 99 mg/dL   Comment 1 Notify RN    Comment 2 Document in Chart   Glucose, capillary  Result Value Ref Range   Glucose-Capillary 360 (H) 65 - 99 mg/dL   Comment 1 Notify RN    Comment 2 Document in Chart   Glucose, capillary  Result Value Ref Range   Glucose-Capillary 475 (H) 65 - 99 mg/dL   Comment 1 Notify RN    Comment 2 Document in Chart    Basic metabolic panel  Result Value Ref Range   Sodium 139 135 - 145 mmol/L   Potassium 3.7 3.5 - 5.1 mmol/L   Chloride 101 101 - 111 mmol/L   CO2 28 22 - 32 mmol/L   Glucose, Bld 251 (H) 65 - 99 mg/dL   BUN 43 (H) 6 - 20 mg/dL   Creatinine, Ser 1.83 (H) 0.44 - 1.00 mg/dL   Calcium 8.9 8.9 - 10.3 mg/dL   GFR calc non Af Amer 27 (L) >60 mL/min   GFR calc Af Amer 31 (L) >60 mL/min   Anion gap 10 5 - 15  Troponin I  Result Value Ref Range   Troponin I 0.54 (HH) <0.03 ng/mL  Renal function panel  Result Value Ref Range   Sodium 138 135 - 145 mmol/L   Potassium 3.7 3.5 - 5.1 mmol/L   Chloride 100 (L) 101 - 111 mmol/L   CO2 28 22 - 32 mmol/L   Glucose, Bld 250 (H) 65 - 99 mg/dL   BUN 43 (H) 6 - 20 mg/dL   Creatinine, Ser 1.82 (H) 0.44 - 1.00 mg/dL   Calcium 8.9 8.9 - 10.3 mg/dL   Phosphorus 2.6 2.5 - 4.6 mg/dL   Albumin 3.5 3.5 - 5.0 g/dL   GFR calc non Af Amer 27 (L) >60 mL/min   GFR calc Af Amer 31 (L) >60 mL/min   Anion gap 10 5 - 15  Glucose, capillary  Result Value Ref Range   Glucose-Capillary 483 (H) 65 - 99 mg/dL   Comment 1 Notify RN    Comment 2 Document in Chart   Glucose, capillary  Result Value Ref Range   Glucose-Capillary 368 (H) 65 - 99 mg/dL   Comment 1 Notify RN    Comment 2 Document in Chart   Glucose, capillary  Result Value Ref Range   Glucose-Capillary 287 (H) 65 - 99 mg/dL   Comment 1 Notify RN    Comment 2 Document in Chart   Glucose, capillary  Result Value Ref Range   Glucose-Capillary 269 (H) 65 - 99 mg/dL   Comment 1 Notify RN   Glucose, capillary  Result Value Ref Range   Glucose-Capillary 320 (H) 65 - 99 mg/dL  Glucose, capillary  Result Value Ref Range   Glucose-Capillary 310 (H) 65 - 99 mg/dL   Comment 1 Notify RN   Renal function panel  Result Value Ref Range   Sodium 137 135 - 145 mmol/L   Potassium 3.8 3.5 - 5.1 mmol/L  Chloride 101 101 - 111 mmol/L   CO2 26 22 - 32 mmol/L   Glucose, Bld 211 (H) 65 - 99 mg/dL   BUN 44 (H)  6 - 20 mg/dL   Creatinine, Ser 1.65 (H) 0.44 - 1.00 mg/dL   Calcium 8.9 8.9 - 10.3 mg/dL   Phosphorus 2.8 2.5 - 4.6 mg/dL   Albumin 3.4 (L) 3.5 - 5.0 g/dL   GFR calc non Af Amer 30 (L) >60 mL/min   GFR calc Af Amer 35 (L) >60 mL/min   Anion gap 10 5 - 15  Glucose, capillary  Result Value Ref Range   Glucose-Capillary 262 (H) 65 - 99 mg/dL   Comment 1 Notify RN    Comment 2 Document in Chart   Glucose, capillary  Result Value Ref Range   Glucose-Capillary 318 (H) 65 - 99 mg/dL   Comment 1 Notify RN    Comment 2 Document in Chart   Glucose, capillary  Result Value Ref Range   Glucose-Capillary 234 (H) 65 - 99 mg/dL   Comment 1 Notify RN    Comment 2 Document in Chart   Glucose, capillary  Result Value Ref Range   Glucose-Capillary 170 (H) 65 - 99 mg/dL   Comment 1 Notify RN   Glucose, capillary  Result Value Ref Range   Glucose-Capillary 112 (H) 65 - 99 mg/dL   Comment 1 Notify RN   Protime-INR  Result Value Ref Range   Prothrombin Time 15.1 11.4 - 15.2 seconds   INR [id number]   Basic metabolic panel  Result Value Ref Range   Sodium 139 135 - 145 mmol/L   Potassium 3.9 3.5 - 5.1 mmol/L   Chloride 104 101 - 111 mmol/L   CO2 24 22 - 32 mmol/L   Glucose, Bld 262 (H) 65 - 99 mg/dL   BUN 31 (H) 6 - 20 mg/dL   Creatinine, Ser 1.49 (H) 0.44 - 1.00 mg/dL   Calcium 9.2 8.9 - 10.3 mg/dL   GFR calc non Af Amer 34 (L) >60 mL/min   GFR calc Af Amer 40 (L) >60 mL/min   Anion gap 11 5 - 15  CBC  Result Value Ref Range   WBC 6.2 4.0 - 10.5 K/uL   RBC 3.32 (L) 3.87 - 5.11 MIL/uL   Hemoglobin 10.5 (L) 12.0 - 15.0 g/dL   HCT 34.2 (L) 36.0 - 46.0 %   MCV 103.0 (H) 78.0 - 100.0 fL   MCH 31.6 26.0 - 34.0 pg   MCHC 30.7 30.0 - 36.0 g/dL   RDW 15.8 (H) 11.5 - 15.5 %   Platelets 182 150 - 400 K/uL  Troponin I (serum)  Result Value Ref Range   Troponin I 0.23 (HH) <0.03 ng/mL  Glucose, capillary  Result Value Ref Range   Glucose-Capillary 130 (H) 65 - 99 mg/dL   Comment 1 Notify RN     Comment 2 Document in Chart   Glucose, capillary  Result Value Ref Range   Glucose-Capillary 227 (H) 65 - 99 mg/dL   Comment 1 Notify RN    Comment 2 Document in Chart   Glucose, capillary  Result Value Ref Range   Glucose-Capillary 104 (H) 65 - 99 mg/dL  Glucose, capillary  Result Value Ref Range   Glucose-Capillary 306 (H) 65 - 99 mg/dL  POCT Activated clotting time  Result Value Ref Range   Activated Clotting Time 401 seconds  POCT Activated clotting time  Result Value Ref Range   Activated  Clotting Time 186 seconds  POCT Activated clotting time  Result Value Ref Range   Activated Clotting Time 131 seconds  ECHOCARDIOGRAM COMPLETE  Result Value Ref Range   Weight 3,174.62 oz   Height 70 in   BP 126/53 mmHg   Chemistry (most recent): Lab Results  Component Value Date   NA 139 10/14/2015   K 3.9 10/14/2015   CL 104 10/14/2015   CO2 24 10/14/2015   BUN 31 (H) 10/14/2015   CREATININE 1.49 (H) 10/14/2015   Diabetic Labs (most recent): Lab Results  Component Value Date   HGBA1C 8.7 (H) 09/25/2015   HGBA1C 9.4 (H) 07/04/2015   HGBA1C 9.1 (H) 06/23/2015     Most recent a1c 9.1% , generally improved from 12.3%. Lipid profile (most recent): -Her most recent glucose profile is consistent with improvement.  Assessment & Plan:   1. Type 2 diabetes mellitus with stage 4 chronic kidney disease and CAD  -She came with improved blood glucose profile, her A1c Improved to 8.7% generally improving from 12.3%. -Patient is advised to stick to a routine mealtimes to eat 3 meals  a day and avoid unnecessary snacks ( to snack only to correct hypoglycemia). Patient is advised to eliminate simple carbs  from their diet including cakes desserts ice cream soda (  diet and regular) , sweet tea , Candies,  chips and cookies, artificial sweeteners,   and "sugar-free" products .  This will help patient to have stable blood glucose profile and potentially lose weight. Patient is given  detailed personalized glucose monitoring and insulin dosing instructions. Patient is instructed to call back with extremes of blood glucose less than 70 or greater than 300. Patient to bring meter and  blood glucose logs during their next visit.  -  She has had ACS since last visit, changed her dietary habits.  - I will  Lower her Tyler Aas to 30  units qhs,  continue Novolog 10 units TIDAC plus correction associated with monitoring of BG ac and hs. -Patient is warned not to take insulin without proper monitoring. -She is encouraged to call clinic if she registers blood glucose below 70 or above 300 mg/dL 3 tests in- a- row . -She does not have a safe oral treatment option for diabetes given advanced chronic kidney disease.  -Patient specific target for A1c, LDL, and triglycerides were discussed with patient.  I will obtain the following labs before her next visit. monitoring and   2. Essential hypertension Pt is allergic to ACEI, controleld. Advised to continue the rest of her medications.  3. Hyperlipemia -continue Crestor.   Follow up plan: No Follow-up on file.  Glade Lloyd, MD Phone: [phone]  Fax: [phone]   11/23/2015, 1:23 PM

## 2015-11-30 DIAGNOSIS — I509 Heart failure, unspecified: Secondary | ICD-10-CM | POA: Diagnosis not present

## 2015-11-30 DIAGNOSIS — I10 Essential (primary) hypertension: Secondary | ICD-10-CM | POA: Diagnosis not present

## 2015-11-30 DIAGNOSIS — I213 ST elevation (STEMI) myocardial infarction of unspecified site: Secondary | ICD-10-CM | POA: Diagnosis not present

## 2015-11-30 DIAGNOSIS — Z7982 Long term (current) use of aspirin: Secondary | ICD-10-CM | POA: Diagnosis not present

## 2015-11-30 DIAGNOSIS — Z79899 Other long term (current) drug therapy: Secondary | ICD-10-CM | POA: Diagnosis not present

## 2015-11-30 DIAGNOSIS — Z794 Long term (current) use of insulin: Secondary | ICD-10-CM | POA: Diagnosis not present

## 2015-11-30 DIAGNOSIS — J811 Chronic pulmonary edema: Secondary | ICD-10-CM | POA: Diagnosis present

## 2015-11-30 DIAGNOSIS — J189 Pneumonia, unspecified organism: Secondary | ICD-10-CM | POA: Diagnosis not present

## 2015-11-30 DIAGNOSIS — E119 Type 2 diabetes mellitus without complications: Secondary | ICD-10-CM | POA: Diagnosis not present

## 2015-12-01 ENCOUNTER — Encounter (HOSPITAL_COMMUNITY)
Admission: AD | Disposition: A | Discharge status: Home / Self Care | Payer: Self-pay | Source: Other Acute Inpatient Hospital | Attending: Cardiology

## 2015-12-01 ENCOUNTER — Inpatient Hospital Stay (HOSPITAL_COMMUNITY)
Admission: AD | Admit: 2015-12-01 | Discharge: 2015-12-03 | DRG: 280 | Disposition: A | Discharge status: Home / Self Care | Payer: BLUE CROSS/BLUE SHIELD | Source: Other Acute Inpatient Hospital | Attending: Cardiology | Admitting: Cardiology

## 2015-12-01 DIAGNOSIS — I252 Old myocardial infarction: Secondary | ICD-10-CM | POA: Diagnosis not present

## 2015-12-01 DIAGNOSIS — I130 Hypertensive heart and chronic kidney disease with heart failure and stage 1 through stage 4 chronic kidney disease, or unspecified chronic kidney disease: Secondary | ICD-10-CM | POA: Diagnosis present

## 2015-12-01 DIAGNOSIS — E1122 Type 2 diabetes mellitus with diabetic chronic kidney disease: Secondary | ICD-10-CM | POA: Diagnosis not present

## 2015-12-01 DIAGNOSIS — Z7902 Long term (current) use of antithrombotics/antiplatelets: Secondary | ICD-10-CM | POA: Diagnosis not present

## 2015-12-01 DIAGNOSIS — R600 Localized edema: Secondary | ICD-10-CM | POA: Diagnosis present

## 2015-12-01 DIAGNOSIS — I5043 Acute on chronic combined systolic (congestive) and diastolic (congestive) heart failure: Secondary | ICD-10-CM | POA: Diagnosis not present

## 2015-12-01 DIAGNOSIS — K219 Gastro-esophageal reflux disease without esophagitis: Secondary | ICD-10-CM | POA: Diagnosis present

## 2015-12-01 DIAGNOSIS — M545 Low back pain: Secondary | ICD-10-CM | POA: Diagnosis present

## 2015-12-01 DIAGNOSIS — I771 Stricture of artery: Secondary | ICD-10-CM | POA: Diagnosis present

## 2015-12-01 DIAGNOSIS — N184 Chronic kidney disease, stage 4 (severe): Secondary | ICD-10-CM | POA: Diagnosis not present

## 2015-12-01 DIAGNOSIS — I5023 Acute on chronic systolic (congestive) heart failure: Secondary | ICD-10-CM | POA: Diagnosis not present

## 2015-12-01 DIAGNOSIS — E785 Hyperlipidemia, unspecified: Secondary | ICD-10-CM | POA: Diagnosis present

## 2015-12-01 DIAGNOSIS — E1165 Type 2 diabetes mellitus with hyperglycemia: Secondary | ICD-10-CM | POA: Diagnosis present

## 2015-12-01 DIAGNOSIS — Z8249 Family history of ischemic heart disease and other diseases of the circulatory system: Secondary | ICD-10-CM | POA: Diagnosis not present

## 2015-12-01 DIAGNOSIS — Z823 Family history of stroke: Secondary | ICD-10-CM

## 2015-12-01 DIAGNOSIS — Z7982 Long term (current) use of aspirin: Secondary | ICD-10-CM

## 2015-12-01 DIAGNOSIS — I2510 Atherosclerotic heart disease of native coronary artery without angina pectoris: Secondary | ICD-10-CM | POA: Diagnosis not present

## 2015-12-01 DIAGNOSIS — Z794 Long term (current) use of insulin: Secondary | ICD-10-CM | POA: Diagnosis not present

## 2015-12-01 DIAGNOSIS — Z888 Allergy status to other drugs, medicaments and biological substances status: Secondary | ICD-10-CM

## 2015-12-01 DIAGNOSIS — I214 Non-ST elevation (NSTEMI) myocardial infarction: Principal | ICD-10-CM

## 2015-12-01 DIAGNOSIS — Z833 Family history of diabetes mellitus: Secondary | ICD-10-CM | POA: Diagnosis not present

## 2015-12-01 DIAGNOSIS — I2582 Chronic total occlusion of coronary artery: Secondary | ICD-10-CM | POA: Diagnosis present

## 2015-12-01 DIAGNOSIS — I255 Ischemic cardiomyopathy: Secondary | ICD-10-CM | POA: Diagnosis present

## 2015-12-01 DIAGNOSIS — I25110 Atherosclerotic heart disease of native coronary artery with unstable angina pectoris: Secondary | ICD-10-CM | POA: Diagnosis not present

## 2015-12-01 DIAGNOSIS — I10 Essential (primary) hypertension: Secondary | ICD-10-CM | POA: Diagnosis present

## 2015-12-01 DIAGNOSIS — Z885 Allergy status to narcotic agent status: Secondary | ICD-10-CM

## 2015-12-01 DIAGNOSIS — Z79899 Other long term (current) drug therapy: Secondary | ICD-10-CM | POA: Diagnosis not present

## 2015-12-01 LAB — CBC WITH DIFFERENTIAL/PLATELET
BASOS ABS: 0 10*3/uL (ref 0.0–0.1)
BASOS PCT: 0 %
EOS PCT: 1 %
Eosinophils Absolute: 0 10*3/uL (ref 0.0–0.7)
HCT: 34.7 % — ABNORMAL LOW (ref 36.0–46.0)
Hemoglobin: 11 g/dL — ABNORMAL LOW (ref 12.0–15.0)
LYMPHS PCT: 20 %
Lymphs Abs: 1 10*3/uL (ref 0.7–4.0)
MCH: 31.9 pg (ref 26.0–34.0)
MCHC: 31.7 g/dL (ref 30.0–36.0)
MCV: 100.6 fL — AB (ref 78.0–100.0)
MONO ABS: 0.4 10*3/uL (ref 0.1–1.0)
MONOS PCT: 8 %
Neutro Abs: 3.5 10*3/uL (ref 1.7–7.7)
Neutrophils Relative %: 71 %
PLATELETS: 172 10*3/uL (ref 150–400)
RBC: 3.45 MIL/uL — ABNORMAL LOW (ref 3.87–5.11)
RDW: 15.2 % (ref 11.5–15.5)
WBC: 5 10*3/uL (ref 4.0–10.5)

## 2015-12-01 LAB — COMPREHENSIVE METABOLIC PANEL
ALBUMIN: 3.9 g/dL (ref 3.5–5.0)
ALK PHOS: 77 U/L (ref 38–126)
ALT: 24 U/L (ref 14–54)
ANION GAP: 11 (ref 5–15)
AST: 55 U/L — AB (ref 15–41)
BILIRUBIN TOTAL: 0.5 mg/dL (ref 0.3–1.2)
BUN: 40 mg/dL — AB (ref 6–20)
CALCIUM: 9.9 mg/dL (ref 8.9–10.3)
CO2: 28 mmol/L (ref 22–32)
CREATININE: 2.02 mg/dL — AB (ref 0.44–1.00)
Chloride: 95 mmol/L — ABNORMAL LOW (ref 101–111)
GFR calc Af Amer: 28 mL/min — ABNORMAL LOW (ref >60)
GFR calc non Af Amer: 24 mL/min — ABNORMAL LOW (ref >60)
GLUCOSE: 375 mg/dL — AB (ref 65–99)
Potassium: 3.6 mmol/L (ref 3.5–5.1)
SODIUM: 134 mmol/L — AB (ref 135–145)
Total Protein: 7.4 g/dL (ref 6.5–8.1)

## 2015-12-01 LAB — BASIC METABOLIC PANEL
ANION GAP: 9 (ref 5–15)
BUN: 37 mg/dL — ABNORMAL HIGH (ref 6–20)
CO2: 29 mmol/L (ref 22–32)
Calcium: 9.7 mg/dL (ref 8.9–10.3)
Chloride: 95 mmol/L — ABNORMAL LOW (ref 101–111)
Creatinine, Ser: 1.96 mg/dL — ABNORMAL HIGH (ref 0.44–1.00)
GFR calc Af Amer: 29 mL/min — ABNORMAL LOW (ref >60)
GFR calc non Af Amer: 25 mL/min — ABNORMAL LOW (ref >60)
GLUCOSE: 124 mg/dL — AB (ref 65–99)
POTASSIUM: 3.9 mmol/L (ref 3.5–5.1)
Sodium: 133 mmol/L — ABNORMAL LOW (ref 135–145)

## 2015-12-01 LAB — TSH: TSH: 1.573 u[IU]/mL (ref 0.350–4.500)

## 2015-12-01 LAB — GLUCOSE, CAPILLARY
Glucose-Capillary: 308 mg/dL — ABNORMAL HIGH (ref 65–99)
Glucose-Capillary: 327 mg/dL — ABNORMAL HIGH (ref 65–99)
Glucose-Capillary: 151 mg/dL — ABNORMAL HIGH (ref 65–99)
Glucose-Capillary: 249 mg/dL — ABNORMAL HIGH (ref 65–99)

## 2015-12-01 LAB — TROPONIN I
TROPONIN I: 10.12 ng/mL — AB (ref <0.03)
TROPONIN I: 5.68 ng/mL — AB (ref <0.03)
Troponin I: 6 ng/mL (ref <0.03)

## 2015-12-01 LAB — HEPARIN LEVEL (UNFRACTIONATED)
Heparin Unfractionated: 0.62 IU/mL (ref 0.30–0.70)
Heparin Unfractionated: 0.68 IU/mL (ref 0.30–0.70)

## 2015-12-01 LAB — BRAIN NATRIURETIC PEPTIDE: B Natriuretic Peptide: 892.5 pg/mL — ABNORMAL HIGH (ref 0.0–100.0)

## 2015-12-01 LAB — MAGNESIUM: Magnesium: 2.5 mg/dL — ABNORMAL HIGH (ref 1.7–2.4)

## 2015-12-01 SURGERY — LEFT HEART CATH AND CORONARY ANGIOGRAPHY

## 2015-12-01 MED ORDER — HEPARIN BOLUS VIA INFUSION
2000.0000 [IU] | Freq: Once | INTRAVENOUS | Status: AC
  Administered 2015-12-01: 2000 [IU] via INTRAVENOUS
  Filled 2015-12-01: qty 2000

## 2015-12-01 MED ORDER — FERROUS SULFATE 325 (65 FE) MG PO TABS
325.0000 mg | ORAL_TABLET | Freq: Every day | ORAL | Status: DC
  Administered 2015-12-01 – 2015-12-03 (×3): 325 mg via ORAL
  Filled 2015-12-01 (×3): qty 1

## 2015-12-01 MED ORDER — HEPARIN (PORCINE) IN NACL 100-0.45 UNIT/ML-% IJ SOLN
950.0000 [IU]/h | INTRAMUSCULAR | Status: DC
  Administered 2015-12-01 – 2015-12-02 (×2): 950 [IU]/h via INTRAVENOUS
  Filled 2015-12-01 (×2): qty 250

## 2015-12-01 MED ORDER — SODIUM CHLORIDE 0.9 % WEIGHT BASED INFUSION: 1.0000 mL/kg/h | INTRAVENOUS | Status: DC

## 2015-12-01 MED ORDER — HYDRALAZINE HCL 25 MG PO TABS
37.5000 mg | ORAL_TABLET | Freq: Three times a day (TID) | ORAL | Status: DC
  Administered 2015-12-01 – 2015-12-03 (×6): 37.5 mg via ORAL
  Filled 2015-12-01 (×7): qty 2

## 2015-12-01 MED ORDER — BENZONATATE 100 MG PO CAPS: 100.0000 mg | ORAL_CAPSULE | Freq: Every day | ORAL | Status: DC | PRN

## 2015-12-01 MED ORDER — ASPIRIN EC 81 MG PO TBEC: 81.0000 mg | DELAYED_RELEASE_TABLET | Freq: Every day | ORAL | Status: DC

## 2015-12-01 MED ORDER — FUROSEMIDE 10 MG/ML IJ SOLN: 80.0000 mg | Freq: Two times a day (BID) | INTRAMUSCULAR | Status: DC

## 2015-12-01 MED ORDER — ACETAMINOPHEN 500 MG PO TABS: 500.0000 mg | ORAL_TABLET | Freq: Four times a day (QID) | ORAL | Status: DC | PRN

## 2015-12-01 MED ORDER — SODIUM CHLORIDE 0.9 % WEIGHT BASED INFUSION
3.0000 mL/kg/h | INTRAVENOUS | Status: DC
  Administered 2015-12-02: 3 mL/kg/h via INTRAVENOUS

## 2015-12-01 MED ORDER — ONDANSETRON HCL 4 MG/2ML IJ SOLN: 4.0000 mg | Freq: Four times a day (QID) | INTRAMUSCULAR | Status: DC | PRN

## 2015-12-01 MED ORDER — ISOSORBIDE MONONITRATE ER 60 MG PO TB24
60.0000 mg | ORAL_TABLET | Freq: Every day | ORAL | Status: DC
  Administered 2015-12-01 – 2015-12-03 (×3): 60 mg via ORAL
  Filled 2015-12-01 (×3): qty 1

## 2015-12-01 MED ORDER — INSULIN ASPART 100 UNIT/ML FLEXPEN: 10.0000 [IU] | PEN_INJECTOR | Freq: Three times a day (TID) | SUBCUTANEOUS | Status: DC

## 2015-12-01 MED ORDER — NITROGLYCERIN 0.4 MG SL SUBL: 0.4000 mg | SUBLINGUAL_TABLET | SUBLINGUAL | Status: DC | PRN

## 2015-12-01 MED ORDER — POTASSIUM CHLORIDE CRYS ER 20 MEQ PO TBCR
40.0000 meq | EXTENDED_RELEASE_TABLET | Freq: Every day | ORAL | Status: DC
  Administered 2015-12-01 – 2015-12-03 (×3): 40 meq via ORAL
  Filled 2015-12-01 (×3): qty 2

## 2015-12-01 MED ORDER — SODIUM CHLORIDE 0.9 % IV SOLN
INTRAVENOUS | Status: DC
  Administered 2015-12-01: 500 mL via INTRAVENOUS

## 2015-12-01 MED ORDER — FAMOTIDINE 20 MG PO TABS
20.0000 mg | ORAL_TABLET | Freq: Every day | ORAL | Status: DC
  Administered 2015-12-01 – 2015-12-03 (×3): 20 mg via ORAL
  Filled 2015-12-01 (×3): qty 1

## 2015-12-01 MED ORDER — INSULIN DEGLUDEC 100 UNIT/ML ~~LOC~~ SOPN
30.0000 [IU] | PEN_INJECTOR | Freq: Every day | SUBCUTANEOUS | Status: DC
  Administered 2015-12-01 – 2015-12-02 (×2): 30 [IU] via SUBCUTANEOUS

## 2015-12-01 MED ORDER — INSULIN LISPRO 100 UNIT/ML (KWIKPEN): 10.0000 [IU] | PEN_INJECTOR | Freq: Three times a day (TID) | SUBCUTANEOUS | Status: DC

## 2015-12-01 MED ORDER — INSULIN ASPART 100 UNIT/ML ~~LOC~~ SOLN
0.0000 [IU] | Freq: Three times a day (TID) | SUBCUTANEOUS | Status: DC
  Administered 2015-12-01: 3 [IU] via SUBCUTANEOUS
  Administered 2015-12-01 (×2): 11 [IU] via SUBCUTANEOUS
  Administered 2015-12-02: 3 [IU] via SUBCUTANEOUS
  Administered 2015-12-02: 2 [IU] via SUBCUTANEOUS
  Administered 2015-12-03: 5 [IU] via SUBCUTANEOUS
  Administered 2015-12-03: 8 [IU] via SUBCUTANEOUS

## 2015-12-01 MED ORDER — ALPRAZOLAM 0.5 MG PO TABS
0.5000 mg | ORAL_TABLET | Freq: Every evening | ORAL | Status: DC | PRN
  Administered 2015-12-01: 0.5 mg via ORAL
  Filled 2015-12-01: qty 1

## 2015-12-01 MED ORDER — ASPIRIN EC 81 MG PO TBEC
81.0000 mg | DELAYED_RELEASE_TABLET | Freq: Every day | ORAL | Status: DC
  Administered 2015-12-01 – 2015-12-03 (×3): 81 mg via ORAL
  Filled 2015-12-01 (×3): qty 1

## 2015-12-01 MED ORDER — INSULIN ASPART 100 UNIT/ML ~~LOC~~ SOLN
4.0000 [IU] | Freq: Three times a day (TID) | SUBCUTANEOUS | Status: DC
  Administered 2015-12-01 – 2015-12-03 (×6): 4 [IU] via SUBCUTANEOUS

## 2015-12-01 MED ORDER — SODIUM CHLORIDE 0.9% FLUSH: 3.0000 mL | Freq: Two times a day (BID) | INTRAVENOUS | Status: DC

## 2015-12-01 MED ORDER — ACETAMINOPHEN 325 MG PO TABS: 650.0000 mg | ORAL_TABLET | ORAL | Status: DC | PRN

## 2015-12-01 MED ORDER — ADULT MULTIVITAMIN W/MINERALS CH
1.0000 | ORAL_TABLET | Freq: Every day | ORAL | Status: DC
  Administered 2015-12-01 – 2015-12-03 (×3): 1 via ORAL
  Filled 2015-12-01 (×3): qty 1

## 2015-12-01 MED ORDER — METOPROLOL SUCCINATE ER 50 MG PO TB24
50.0000 mg | ORAL_TABLET | Freq: Two times a day (BID) | ORAL | Status: DC
  Administered 2015-12-01 – 2015-12-03 (×5): 50 mg via ORAL
  Filled 2015-12-01 (×5): qty 1

## 2015-12-01 MED ORDER — GABAPENTIN 100 MG PO CAPS
100.0000 mg | ORAL_CAPSULE | Freq: Every day | ORAL | Status: DC | PRN
  Administered 2015-12-01: 100 mg via ORAL
  Filled 2015-12-01: qty 1

## 2015-12-01 MED ORDER — AMLODIPINE BESYLATE 10 MG PO TABS
10.0000 mg | ORAL_TABLET | Freq: Every day | ORAL | Status: DC
  Administered 2015-12-01 – 2015-12-03 (×3): 10 mg via ORAL
  Filled 2015-12-01 (×3): qty 1

## 2015-12-01 MED ORDER — SODIUM CHLORIDE 0.9% FLUSH
3.0000 mL | INTRAVENOUS | Status: DC | PRN
  Administered 2015-12-01: 3 mL via INTRAVENOUS
  Filled 2015-12-01: qty 3

## 2015-12-01 MED ORDER — TICAGRELOR 90 MG PO TABS
90.0000 mg | ORAL_TABLET | Freq: Two times a day (BID) | ORAL | Status: DC
  Administered 2015-12-01 – 2015-12-03 (×5): 90 mg via ORAL
  Filled 2015-12-01 (×5): qty 1

## 2015-12-01 MED ORDER — ROSUVASTATIN CALCIUM 10 MG PO TABS
40.0000 mg | ORAL_TABLET | Freq: Every day | ORAL | Status: DC
  Administered 2015-12-01 – 2015-12-02 (×2): 40 mg via ORAL
  Filled 2015-12-01 (×2): qty 4

## 2015-12-01 MED ORDER — RANOLAZINE ER 500 MG PO TB12
500.0000 mg | ORAL_TABLET | Freq: Two times a day (BID) | ORAL | Status: DC
  Administered 2015-12-01 – 2015-12-03 (×5): 500 mg via ORAL
  Filled 2015-12-01 (×5): qty 1

## 2015-12-01 MED ORDER — INSULIN ASPART 100 UNIT/ML ~~LOC~~ SOLN
0.0000 [IU] | Freq: Every day | SUBCUTANEOUS | Status: DC
  Administered 2015-12-01: 2 [IU] via SUBCUTANEOUS
  Administered 2015-12-02: 4 [IU] via SUBCUTANEOUS

## 2015-12-01 MED ORDER — LATANOPROST 0.005 % OP SOLN
1.0000 [drp] | Freq: Every day | OPHTHALMIC | Status: DC
  Administered 2015-12-01: 1 [drp] via OPHTHALMIC
  Filled 2015-12-01: qty 2.5

## 2015-12-01 MED ORDER — INSULIN DEGLUDEC 100 UNIT/ML ~~LOC~~ SOPN: 30.0000 [IU] | PEN_INJECTOR | Freq: Every day | SUBCUTANEOUS | Status: DC

## 2015-12-01 MED ORDER — SODIUM CHLORIDE 0.9 % IV SOLN: 250.0000 mL | INTRAVENOUS | Status: DC | PRN

## 2015-12-01 NOTE — Progress Notes (Signed)
ANTICOAGULATION CONSULT NOTE - Initial Consult  Pharmacy Consult for heparin Indication: chest pain/ACS  Allergies  Allergen Reactions  . Ace Inhibitors Cough    Pt can tolerate Tribenzor (and ARBs)  . Codeine Rash    Patient Measurements: Height: 5\' 11"  (180.3 cm) Weight: 205 lb 1.6 oz (93 kg) IBW/kg (Calculated) : 70.8 Heparin Dosing Weight: 90kg  Vital Signs: Temp: 98 F (36.7 C) (09/26 0000) Temp Source: Oral (09/26 0000) BP: 132/61 (09/26 0000) Pulse Rate: 62 (09/26 0000)   Medical History: Past Medical History:  Diagnosis Date  . Acute CHF (congestive heart failure) ([place]) 07/04/2015  . Acute renal failure superimposed on stage 4 chronic kidney disease ([place]) 07/04/2015  . Allergic rhinitis   . Anxiety   . Constipation   . Diabetes mellitus   . GERD (gastroesophageal reflux disease)   . Heart murmur, systolic   . History of hysterectomy   . Hyperlipidemia   . Hypertension   . Low back pain   . Obesity   . Osteoarthritis   . Overactive bladder     Assessment: 70yo female tx'd from [facility] for CHF, also w/ known CAD and mildly elevated troponin, to begin heparin; given Lovenox 40mg  9/26 1830 at OSH.  Labs from OSH: SCr 1.8, trop 0.49, A1c 10, glu 443, WBC 6.6, Hgb 12, Plt 166, Na 134, K 4.2, BNP 957.  Goal of Therapy:  Heparin level 0.3-0.7 units/ml Monitor platelets by anticoagulation protocol: Yes   Plan:  Will give heparin 2000 units IV bolus x1 followed by gtt at 950 units/hr (was recently therapeutic at this rate) and monitor heparin levels and CBC.  Wynona Neat, PharmD, BCPS  12/01/2015,1:36 AM

## 2015-12-01 NOTE — Progress Notes (Signed)
Inpatient Diabetes Program Recommendations  AACE/ADA: New Consensus Statement on Inpatient Glycemic Control (2015)  Target Ranges:  Prepandial:   less than 140 mg/dL      Peak postprandial:   less than 180 mg/dL (1-2 hours)      Critically ill patients:  140 - 180 mg/dL   Lab Results  Component Value Date   GLUCAP 327 (H) 12/01/2015   HGBA1C 8.7 (H) 09/25/2015    Review of Glycemic Control  Spoke with patient about her diabetes and control. Patient sees Dr. Dorris Fetch (Endocrinologist, [place], [place]) for diabetes control. Patients family bringing [place] from home. Patient to get [place] today. Patient reports that her glucose being checked after she ate is one of the reasons why her glucose is in the 300's while here. Patient reports recently seeing Dr. Dorris Fetch. Will follow with him for more management.  Thanks,  Tama Headings RN, MSN, [facility] Inpatient Diabetes Coordinator Team Pager [phone] (8a-5p)

## 2015-12-01 NOTE — Progress Notes (Signed)
    70 yo with history of CAD s/p multiple interventions and chronic subtotal LCx occlusion, right subclavian occlusion s/p thrombectomy, and poorly-controlled DM was transferred from [facility] ER with CHF. Patient has had a cough chronically for about a month now.  She drives a school bus.  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 She developed shortness of breath so went to the ER at [facility]. She was in pulmonary edema by CXR and got IV Lasix.  She was subsequently sent to [facility].   No chest pain.  No significant dyspnea prior to yesterday.  Felt better after getting IV Lasix in the ER.  She does not think that she has gained weight recently.    PMH: 1. R subclavian occlusion: s/p thrombectomy in 5/17.  2. CAD:  - Taxus DES to pLCx 2008. - NSTEMI 4/17 with subtotal LCx occlusion that was not intervened on.  Patient received DES to mid RCA and PDA. - Unstable angina 8/17 with subtotal LCx occlusion, 85% ISR in PDA treated with PTCA. 3. Ischemic cardiomyopathy:  - Echo (8/17) with EF 40-45%, regional WMAs, normal RV.  4. CKD stage III-IV 5. GERD 6. Hyperlipidemia 7. Low back pain 8. HTN 9. Type II diabetes  Pt seen this AM -- remains asymptomatic. Troponin trending down.  Lab Results  Component Value Date   TROPONINI 6.00 ([place]) 12/01/2015   Principal Problem:   NSTEMI (non-ST elevated myocardial infarction) ([place]) Active Problems:   Atherosclerosis of native coronary artery of native heart with unstable angina pectoris ([place])   Hyperlipemia   Essential hypertension   Type 2 diabetes mellitus with stage 4 chronic kidney disease (HCC)   Bilateral lower extremity edema   Subclavian artery stenosis, right   Acute on chronic systolic CHF (congestive heart failure) (HCC)   ASSESSMENT AND PLAN: 70 yo with history of CAD s/p multiple interventions and chronic subtotal LCx occlusion, right subclavian occlusion s/p thrombectomy, and  poorly-controlled DM was transferred from [facility] ER with CHF & NSTEMI. 1. CAD: Extensive history as documented in [place]. - Now with Troponin ~10-->6 c/w NSTEMI - Continue ASA and statin.  - Will keep her on heparin gtt for now.   - With siginficant ++ Troponin - suspect NSTEMI is more likely the cause of Sx - Will need LHC-PCI tomorrow pending stability of renal Fxn. 2. Acute on chronic systolic CHF: Echo 0000000 with EF 40-45%, regional wall motion abnormalities.  Ischemic cardiomyopathy.  She is volume overloaded on exam.  She says she has been coughing for a month, so volume overload could have gradually accumulated or there could have been a trigger today (?ischemia).   - Lasix 80 mg IV bid stopped after Troponin level increased. - Continue current hydralazine/Imdur.  - Continue Toprol XL 50 bid.  - No ACEI/ARB/spironolactone with elevated creatinine.   3. CKD stage 3-4: Creatinine 1.8 yesterday.  Follow closely with diuresis. She is followed by Dr Lorrene Reid as an outpatient.  4. Diabetes: Hemoglobin A1c 10.  consult diabetes coordinator, poor control.    Remains on IV Heparin & gentle IV Fluids for worsening Cr on admit labs. (baseline ~1.6) - recheck BMP this PM.   Will d/c IVF (as she is on IV Lasix) for now & restart in AM pre-cath. (EF ~45% by last Echo with Gr 2 DD) -- want to avoid volume overload / CHF.

## 2015-12-01 NOTE — Progress Notes (Signed)
CRITICAL VALUE ALERT  Critical value received:  Troponin 10.12  Date of notification:  12/01/15  Time of notification:  0405  Critical value read back: yes  Nurse who received alert:  Andreas Blower and Renita Papa   MD notified (1st page):  Dr. Aundra Dubin   Time of first page:  [phone]  Responding MD:  Aundra Dubin  Time MD responded:  [phone]  Order given to make patient NPO for possible cath

## 2015-12-01 NOTE — H&P (Addendum)
Physician History and Physical    [name] MRN: [id number] DOB/AGE: 1945/06/23 70 y.o. Admit date: 12/01/2015  Primary Cardiologist: Jacinta Shoe  HPI: 70 yo with history of CAD s/p multiple interventions and chronic subtotal LCx occlusion, right subclavian occlusion s/p thrombectomy, and poorly-controlled DM was transferred from [facility] ER with CHF. Patient has had a cough chronically for about a month now.  She drives a school bus.  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 She developed shortness of breath so went to the ER at [facility]. She was in pulmonary edema by CXR and got IV Lasix.  She was subsequently sent to [facility].   No chest pain.  No significant dyspnea prior to today.  She feels better after getting IV Lasix in the ER.  She does not think that she has gained weight recently.   PMH: 1. R subclavian occlusion: s/p thrombectomy in 5/17.  2. CAD:  - Taxus DES to pLCx 2008. - NSTEMI 4/17 with subtotal LCx occlusion that was not intervened on.  Patient received DES to mid RCA and PDA. - Unstable angina 8/17 with subtotal LCx occlusion, 85% ISR in PDA treated with PTCA. 3. Ischemic cardiomyopathy:  - Echo (8/17) with EF 40-45%, regional WMAs, normal RV.  4. CKD stage III-IV 5. GERD 6. Hyperlipidemia 7. Low back pain 8. HTN 9. Type II diabetes  Review of systems complete and found to be negative unless listed above   Family History  Problem Relation Age of Onset  . Diabetes Father   . Hypertension Father   . Stroke Father   . Hypertension Brother   . Aneurysm Brother   . Diabetes Brother   . Colon cancer Neg Hx     Social History   Social History  . Marital status: Married    Spouse name: N/A  . Number of children: 3  . Years of education: N/A   Occupational History  . Not on file.   Social History Main Topics  . Smoking status: Never Smoker  . Smokeless tobacco: Never Used  . Alcohol use No  . Drug  use: No  . Sexual activity: Not on file   Other Topics Concern  . Not on file   Social History Narrative  . No narrative on file     Prescriptions Prior to Admission  Medication Sig Dispense Refill Last Dose  . acetaminophen (TYLENOL) 500 MG tablet Take 2 tablets (1,000 mg total) by mouth every 6 (six) hours as needed (pain). 30 tablet 0 Taking  . ALPRAZolam (XANAX) 0.5 MG tablet Take 0.5 mg by mouth at bedtime as needed for sleep.    Taking  . amLODipine (NORVASC) 10 MG tablet Take 1 tablet (10 mg total) by mouth daily. 30 tablet 6 Taking  . aspirin EC 81 MG tablet Take 81 mg by mouth daily.   Taking  . benzonatate (TESSALON) 100 MG capsule Take 100 mg by mouth daily as needed for cough.   Taking  . calcitRIOL (ROCALTROL) 0.25 MCG capsule Take 0.25 mcg by mouth every Monday, Wednesday, and Friday.    Taking  . famotidine (PEPCID) 20 MG tablet Take 20 mg by mouth daily.   Taking  . ferrous sulfate 325 (65 FE) MG tablet Take 325 mg by mouth daily.   Taking  . gabapentin (NEURONTIN) 100 MG capsule Take 100 mg by mouth daily as needed (pain).    Taking  . glucose blood (  ACCU-CHEK AVIVA) test strip Use to check blood glucose 4 times a day. E11.65 150 each 5 Taking  . hydrALAZINE (APRESOLINE) 25 MG tablet Take 1.5 tablets (37.5 mg total) by mouth every 8 (eight) hours. 90 tablet 0 Taking  . insulin aspart (NOVOLOG FLEXPEN) 100 UNIT/ML FlexPen Inject 10-16 Units into the skin 3 (three) times daily with meals. 15 mL 2   . insulin degludec (TRESIBA FLEXTOUCH) 100 UNIT/ML SOPN FlexTouch Pen Inject 0.3 mLs (30 Units total) into the skin at bedtime. 3 pen 2   . insulin lispro (HUMALOG KWIKPEN) 100 UNIT/ML KiwkPen Inject 0.1-0.16 mLs (10-16 Units total) into the skin 3 (three) times daily. 15 mL 11   . isosorbide mononitrate (IMDUR) 60 MG 24 hr tablet Take 1 tablet (60 mg total) by mouth daily. 30 tablet 0 Taking  . latanoprost (XALATAN) 0.005 % ophthalmic solution Place 1 drop into both eyes at  bedtime.    Taking  . meclizine (ANTIVERT) 12.5 MG tablet Take 12.5 mg by mouth 2 (two) times daily.    Taking  . metoprolol succinate (TOPROL-XL) 50 MG 24 hr tablet Take 1 tablet (50 mg total) by mouth 2 (two) times daily. Take with or immediately following a meal. 60 tablet 5 Taking  . Multiple Vitamin (MULTIVITAMIN WITH MINERALS) TABS tablet Take 1 tablet by mouth daily.   Taking  . nitroGLYCERIN (NITROSTAT) 0.4 MG SL tablet Place 1 tablet (0.4 mg total) under the tongue every 5 (five) minutes as needed for chest pain. 25 tablet 2 Taking  . potassium chloride SA (K-DUR,KLOR-CON) 20 MEQ tablet Take 2 tablets (40 mEq total) by mouth daily. 60 tablet 5 Taking  . ranolazine (RANEXA) 500 MG 12 hr tablet Take 1 tablet (500 mg total) by mouth 2 (two) times daily. 60 tablet 5 Taking  . rosuvastatin (CRESTOR) 40 MG tablet Take 1 tablet (40 mg total) by mouth at bedtime. 30 tablet 0 Taking  . ticagrelor (BRILINTA) 90 MG TABS tablet Take 1 tablet (90 mg total) by mouth 2 (two) times daily. 60 tablet 0 Taking  . torsemide (DEMADEX) 20 MG tablet Take 3 tabs (60mg ) by mouth every morning & 1 tab (20mg ) mid day     . trimethoprim (TRIMPEX) 100 MG tablet Take 100 mg by mouth daily.    Taking    Physical Exam: Blood pressure 132/61, pulse 62, temperature 98 F (36.7 C), temperature source Oral, resp. rate 16, height 5\' 11"  (1.803 m), weight 205 lb 1.6 oz (93 kg), SpO2 97 %.  General: NAD Neck: JVP 10-11 cm, no thyromegaly or thyroid nodule.  Lungs: Crackles at bases bilaterally.  CV: Nondisplaced PMI.  Heart regular S1/S2, no S3/S4, no murmur.  1+ ankle edema.  No carotid bruit.  Normal pedal pulses.  Abdomen: Soft, nontender, no hepatosplenomegaly, no distention.  Skin: Intact without lesions or rashes.  Neurologic: Alert and oriented x 3.  Psych: Normal affect. Extremities: No clubbing or cyanosis.  HEENT: Normal.   Labs: HgbA1c 10, K 4.2, creatinine 1.8, HCT 38.1, TnI 0.49, NT-proBNP 957    Radiology: - CXR: Pulmonary edema.  EKG: NSR, poor RWP, nonspecific T wave flattening  ASSESSMENT AND PLAN: 70 yo with history of CAD s/p multiple interventions and chronic subtotal LCx occlusion, right subclavian occlusion s/p thrombectomy, and poorly-controlled DM was transferred from [facility] ER with CHF.  1. Acute on chronic systolic CHF: Echo 0000000 with EF 40-45%, regional wall motion abnormalities.  Ischemic cardiomyopathy.  She is volume overloaded on exam.  She says she has been coughing for a month, so volume overload could have gradually accumulated or there could have been a trigger today (?ischemia).   - Lasix 80 mg IV bid for now.  - Continue current hydralazine/Imdur.  - Continue Toprol XL 50 bid.  - No ACEI/ARB/spironolactone with elevated creatinine.  2. CAD: Extensive history as documented in [place].  She has a known subtotally occluded LCx, some consideration was given to opening this vessel in the future.  MRI viability would be of limited value .  She has had no chest pain.  Troponin is mildly elevated, but could be due to demand ischemia in the setting of volume overload.  - Cycle troponin to peak.  - Continue ASA and statin.  - Will keep her on heparin gtt for now.   - If troponin does not trend up, would probably hold off on angiography given CKD stage 3-4.  3. CKD stage 3-4: Creatinine 1.8 yesterday.  Follow closely with diuresis. She is followed by Dr Lorrene Reid as an outpatient.  4. Diabetes: Hemoglobin A1c 10.  Will consult diabetes coordinator, poor control.   Loralie Champagne 12/01/2015 1:11 AM  Repeat troponin up to 10.  She still is not having any chest pain and breathing is better.  However, creatinine is up to 2.    She is going to need coronary angiography, but given lack of chest pain and improved dyspnea, would probably hold off today with rise in creatinine.  Would give gentle hydration (very gentle with pulmonary edema yesterday) and hold IV Lasix (got at  [facility] and then one dose here).  If creatinine comes down closer to baseline (1.5-1.6) tomorrow, plan cath.   Loralie Champagne 12/01/2015

## 2015-12-01 NOTE — Plan of Care (Signed)
Problem: Consults Goal: Cardiac Cath Patient Education (See Patient Education module for education specifics.)  Outcome: Progressing Pt provided with info for accessing cardiac cath video. Has recently had a cardiac cath and feels well-informed regarding procedure Goal: Diabetes Guidelines if Diabetic/Glucose > 140 If diabetic or lab glucose is > 140 mg/dl - Initiate Diabetes/Hyperglycemia Guidelines & Document Interventions   Outcome: Progressing CBG has declined this shift, last level 151 this pm  Problem: Phase I Progression Outcomes Goal: Pain controlled with appropriate interventions Outcome: Progressing Pt reports no pain this shift Goal: Voiding-avoid urinary catheter unless indicated Outcome: Progressing Pt voiding independently at [facility]

## 2015-12-01 NOTE — Plan of Care (Signed)
Problem: Education: Goal: Knowledge of [facility] Education information/materials will improve Outcome: Progressing Oriented to hospital and room  Problem: Health Behavior/Discharge Planning: Goal: Ability to manage health-related needs will improve Outcome: Progressing Well-informed on all meds receiving and plan for blood glucose management  Problem: Pain Managment: Goal: General experience of comfort will improve Pt reports no pain this shift  Problem: Physical Regulation: Goal: Ability to maintain clinical measurements within normal limits will improve Outcome: Progressing Held Lasix d/t elevated BUN/creatinine, CBG decreased from 327 to 151  Problem: Tissue Perfusion: Goal: Risk factors for ineffective tissue perfusion will decrease Outcome: Progressing Cardiac cath scheduled for tomorrow  Problem: Fluid Volume: Goal: Ability to maintain a balanced intake and output will improve Outcome: Progressing D/cd NS infusion per order to reduce risk of fluid volume overload  Problem: Activity: Goal: Capacity to carry out activities will improve Outcome: Progressing Encouraged deep breathing  Problem: Education: Goal: Ability to verbalize understanding of medication therapies will improve Outcome: Completed/Met Date Met: 12/01/15 Pt well educated on all medications currently receiving

## 2015-12-01 NOTE — Progress Notes (Signed)
[place] for heparin Indication: chest pain/ACS  Allergies  Allergen Reactions  . Ace Inhibitors Cough    Pt can tolerate Tribenzor (and ARBs)  . Codeine Rash    Patient Measurements: Height: 5\' 11"  (180.3 cm) Weight: 204 lb 14 oz (92.9 kg) IBW/kg (Calculated) : 70.8 Heparin Dosing Weight: 90kg  Vital Signs: Temp: 98 F (36.7 C) (09/26 1348) Temp Source: Oral (09/26 1348) BP: 138/63 (09/26 1348) Pulse Rate: 75 (09/26 1348)   Medical History: Past Medical History:  Diagnosis Date  . Acute CHF (congestive heart failure) ([place]) 07/04/2015  . Acute renal failure superimposed on stage 4 chronic kidney disease ([place]) 07/04/2015  . Allergic rhinitis   . Anxiety   . Constipation   . Diabetes mellitus   . GERD (gastroesophageal reflux disease)   . Heart murmur, systolic   . History of hysterectomy   . Hyperlipidemia   . Hypertension   . Low back pain   . Obesity   . Osteoarthritis   . Overactive bladder     Assessment: 70yo female tx'd from [facility] for CHF, also w/ known CAD with troponin up to 10. Pharmacy dosing heparin. Plans noted for cath.  -Heparin level= 0.68 and at goal on drip rate 950 uts/hr  Goal of Therapy:  Heparin level 0.3-0.7 units/ml Monitor platelets by anticoagulation protocol: Yes   Plan:  Continue Heparin drip 950 uts/hr Daily HL, CBC  Bonnita Nasuti Pharm.D. CPP, BCPS Clinical Pharmacist [phone] 12/01/2015 9:15 PM

## 2015-12-01 NOTE — Progress Notes (Signed)
[place] for heparin Indication: chest pain/ACS  Allergies  Allergen Reactions  . Ace Inhibitors Cough    Pt can tolerate Tribenzor (and ARBs)  . Codeine Rash    Patient Measurements: Height: 5\' 11"  (180.3 cm) Weight: 204 lb 14 oz (92.9 kg) IBW/kg (Calculated) : 70.8 Heparin Dosing Weight: 90kg  Vital Signs: Temp: 98 F (36.7 C) (09/26 1348) Temp Source: Oral (09/26 1348) BP: 138/63 (09/26 1348) Pulse Rate: 75 (09/26 1348)   Medical History: Past Medical History:  Diagnosis Date  . Acute CHF (congestive heart failure) ([place]) 07/04/2015  . Acute renal failure superimposed on stage 4 chronic kidney disease ([place]) 07/04/2015  . Allergic rhinitis   . Anxiety   . Constipation   . Diabetes mellitus   . GERD (gastroesophageal reflux disease)   . Heart murmur, systolic   . History of hysterectomy   . Hyperlipidemia   . Hypertension   . Low back pain   . Obesity   . Osteoarthritis   . Overactive bladder     Assessment: 70yo female tx'd from [facility] for CHF, also w/ known CAD with troponin up to 10. Pharmacy dosing heparin. Plans noted for cath.  -Heparin level= 0.62 and at goal   Goal of Therapy:  Heparin level 0.3-0.7 units/ml Monitor platelets by anticoagulation protocol: Yes   Plan:  -No heparin changes needed -Will confirm a heparin level later today  Hildred Laser, Pharm D 12/01/2015 3:45 PM

## 2015-12-02 ENCOUNTER — Encounter (HOSPITAL_COMMUNITY)
Admission: AD | Disposition: A | Discharge status: Home / Self Care | Payer: Self-pay | Source: Other Acute Inpatient Hospital | Attending: Cardiology

## 2015-12-02 ENCOUNTER — Encounter (HOSPITAL_COMMUNITY): Payer: Self-pay | Admitting: Cardiology

## 2015-12-02 DIAGNOSIS — E785 Hyperlipidemia, unspecified: Secondary | ICD-10-CM

## 2015-12-02 DIAGNOSIS — E1122 Type 2 diabetes mellitus with diabetic chronic kidney disease: Secondary | ICD-10-CM

## 2015-12-02 DIAGNOSIS — I708 Atherosclerosis of other arteries: Secondary | ICD-10-CM

## 2015-12-02 DIAGNOSIS — I10 Essential (primary) hypertension: Secondary | ICD-10-CM

## 2015-12-02 DIAGNOSIS — I2510 Atherosclerotic heart disease of native coronary artery without angina pectoris: Secondary | ICD-10-CM

## 2015-12-02 DIAGNOSIS — I25110 Atherosclerotic heart disease of native coronary artery with unstable angina pectoris: Secondary | ICD-10-CM

## 2015-12-02 DIAGNOSIS — R600 Localized edema: Secondary | ICD-10-CM

## 2015-12-02 DIAGNOSIS — Z794 Long term (current) use of insulin: Secondary | ICD-10-CM

## 2015-12-02 HISTORY — PX: CARDIAC CATHETERIZATION: SHX172

## 2015-12-02 LAB — GLUCOSE, CAPILLARY
Glucose-Capillary: 165 mg/dL — ABNORMAL HIGH (ref 65–99)
Glucose-Capillary: 155 mg/dL — ABNORMAL HIGH (ref 65–99)
Glucose-Capillary: 138 mg/dL — ABNORMAL HIGH (ref 65–99)
Glucose-Capillary: 336 mg/dL — ABNORMAL HIGH (ref 65–99)

## 2015-12-02 LAB — CBC
HCT: 29.6 % — ABNORMAL LOW (ref 36.0–46.0)
Hemoglobin: 9.2 g/dL — ABNORMAL LOW (ref 12.0–15.0)
MCH: 31.3 pg (ref 26.0–34.0)
MCHC: 31.1 g/dL (ref 30.0–36.0)
MCV: 100.7 fL — ABNORMAL HIGH (ref 78.0–100.0)
PLATELETS: 147 10*3/uL — AB (ref 150–400)
RBC: 2.94 MIL/uL — ABNORMAL LOW (ref 3.87–5.11)
RDW: 15.4 % (ref 11.5–15.5)
WBC: 4.9 10*3/uL (ref 4.0–10.5)

## 2015-12-02 LAB — BASIC METABOLIC PANEL
Anion gap: 12 (ref 5–15)
BUN: 35 mg/dL — AB (ref 6–20)
CHLORIDE: 98 mmol/L — AB (ref 101–111)
CO2: 27 mmol/L (ref 22–32)
CREATININE: 1.72 mg/dL — AB (ref 0.44–1.00)
Calcium: 9.8 mg/dL (ref 8.9–10.3)
GFR calc non Af Amer: 29 mL/min — ABNORMAL LOW (ref >60)
GFR, EST AFRICAN AMERICAN: 34 mL/min — AB (ref >60)
GLUCOSE: 170 mg/dL — AB (ref 65–99)
Potassium: 3.8 mmol/L (ref 3.5–5.1)
SODIUM: 137 mmol/L (ref 135–145)

## 2015-12-02 LAB — PROTIME-INR
INR: 1.11
PROTHROMBIN TIME: 14.3 s (ref 11.4–15.2)

## 2015-12-02 LAB — HEPARIN LEVEL (UNFRACTIONATED): HEPARIN UNFRACTIONATED: 0.56 [IU]/mL (ref 0.30–0.70)

## 2015-12-02 SURGERY — LEFT HEART CATH AND CORONARY ANGIOGRAPHY

## 2015-12-02 MED ORDER — MIDAZOLAM HCL 2 MG/2ML IJ SOLN
INTRAMUSCULAR | Status: DC | PRN
  Administered 2015-12-02: 1 mg via INTRAVENOUS

## 2015-12-02 MED ORDER — SODIUM CHLORIDE 0.9 % IV SOLN: INTRAVENOUS | Status: AC

## 2015-12-02 MED ORDER — SODIUM CHLORIDE 0.9 % IV SOLN: 250.0000 mL | INTRAVENOUS | Status: DC | PRN

## 2015-12-02 MED ORDER — MIDAZOLAM HCL 2 MG/2ML IJ SOLN
INTRAMUSCULAR | Status: AC
  Filled 2015-12-02: qty 2

## 2015-12-02 MED ORDER — SODIUM CHLORIDE 0.9% FLUSH: 3.0000 mL | INTRAVENOUS | Status: DC | PRN

## 2015-12-02 MED ORDER — VERAPAMIL HCL 2.5 MG/ML IV SOLN
INTRAVENOUS | Status: DC | PRN
  Administered 2015-12-02: 10 mL via INTRA_ARTERIAL

## 2015-12-02 MED ORDER — NITROGLYCERIN 1 MG/10 ML FOR IR/CATH LAB
INTRA_ARTERIAL | Status: AC
  Filled 2015-12-02: qty 10

## 2015-12-02 MED ORDER — HEPARIN SODIUM (PORCINE) 1000 UNIT/ML IJ SOLN
INTRAMUSCULAR | Status: AC
  Filled 2015-12-02: qty 1

## 2015-12-02 MED ORDER — FENTANYL CITRATE (PF) 100 MCG/2ML IJ SOLN
INTRAMUSCULAR | Status: DC | PRN
  Administered 2015-12-02: 25 ug via INTRAVENOUS

## 2015-12-02 MED ORDER — SODIUM CHLORIDE 0.9% FLUSH: 3.0000 mL | Freq: Two times a day (BID) | INTRAVENOUS | Status: DC

## 2015-12-02 MED ORDER — IOPAMIDOL (ISOVUE-370) INJECTION 76%
INTRAVENOUS | Status: AC
Start: 2015-12-02 — End: 2015-12-02
  Filled 2015-12-02: qty 100

## 2015-12-02 MED ORDER — HEPARIN (PORCINE) IN NACL 2-0.9 UNIT/ML-% IJ SOLN
INTRAMUSCULAR | Status: AC
  Filled 2015-12-02: qty 1000

## 2015-12-02 MED ORDER — SODIUM CHLORIDE 0.9% FLUSH
3.0000 mL | INTRAVENOUS | Status: DC | PRN
Start: 2015-12-02 — End: 2015-12-03

## 2015-12-02 MED ORDER — HEPARIN SODIUM (PORCINE) 1000 UNIT/ML IJ SOLN
INTRAMUSCULAR | Status: DC | PRN
  Administered 2015-12-02: 4500 [IU] via INTRAVENOUS

## 2015-12-02 MED ORDER — LIDOCAINE HCL (PF) 1 % IJ SOLN
INTRAMUSCULAR | Status: DC | PRN
  Administered 2015-12-02: 2 mL

## 2015-12-02 MED ORDER — HEPARIN SODIUM (PORCINE) 5000 UNIT/ML IJ SOLN
5000.0000 [IU] | Freq: Three times a day (TID) | INTRAMUSCULAR | Status: DC
  Administered 2015-12-03: 5000 [IU] via SUBCUTANEOUS
  Filled 2015-12-02: qty 1

## 2015-12-02 MED ORDER — LIDOCAINE HCL (PF) 1 % IJ SOLN
INTRAMUSCULAR | Status: AC
  Filled 2015-12-02: qty 30

## 2015-12-02 MED ORDER — FENTANYL CITRATE (PF) 100 MCG/2ML IJ SOLN
INTRAMUSCULAR | Status: AC
  Filled 2015-12-02: qty 2

## 2015-12-02 MED ORDER — SODIUM CHLORIDE 0.9% FLUSH
3.0000 mL | Freq: Two times a day (BID) | INTRAVENOUS | Status: DC
  Administered 2015-12-03: 3 mL via INTRAVENOUS

## 2015-12-02 MED ORDER — HEPARIN (PORCINE) IN NACL 2-0.9 UNIT/ML-% IJ SOLN
INTRAMUSCULAR | Status: DC | PRN
  Administered 2015-12-02: 1500 mL

## 2015-12-02 MED ORDER — IOPAMIDOL (ISOVUE-370) INJECTION 76%
INTRAVENOUS | Status: DC | PRN
  Administered 2015-12-02: 50 mL via INTRA_ARTERIAL

## 2015-12-02 SURGICAL SUPPLY — 9 items
CATH IMPULSE 5F ANG/FL3.5 (CATHETERS) ×3 IMPLANT
DEVICE RAD COMP TR BAND LRG (VASCULAR PRODUCTS) ×3 IMPLANT
GLIDESHEATH SLEND SS 6F .021 (SHEATH) ×3 IMPLANT
KIT HEART LEFT (KITS) ×3 IMPLANT
PACK CARDIAC CATHETERIZATION (CUSTOM PROCEDURE TRAY) ×3 IMPLANT
SYR MEDRAD MARK V 150ML (SYRINGE) ×3 IMPLANT
TRANSDUCER W/STOPCOCK (MISCELLANEOUS) ×3 IMPLANT
TUBING CIL FLEX 10 FLL-RA (TUBING) ×3 IMPLANT
WIRE SAFE-T 1.5MM-J .035X260CM (WIRE) ×3 IMPLANT

## 2015-12-02 NOTE — Interval H&P Note (Signed)
History and Physical Interval Note:  12/02/2015 1:30 PM  [name]  has presented today for surgery, with the diagnosis of cp  The various methods of treatment have been discussed with the patient and family. After consideration of risks, benefits and other options for treatment, the patient has consented to  Procedure(s): Left Heart Cath and Coronary Angiography (N/A) as a surgical intervention .  The patient's history has been reviewed, patient examined, no change in status, stable for surgery.  I have reviewed the patient's chart and labs.  Questions were answered to the patient's satisfaction.    Cath Lab Visit (complete for each Cath Lab visit)  Clinical Evaluation Leading to the Procedure:   ACS: Yes.    Non-ACS:    Anginal Classification: CCS III  Anti-ischemic medical therapy: Minimal Therapy (1 class of medications)  Non-Invasive Test Results: No non-invasive testing performed  Prior CABG: No previous CABG       [name] [facility] 12/02/2015 1:30 PM

## 2015-12-02 NOTE — Progress Notes (Signed)
[place] for heparin Indication: chest pain/ACS  Allergies  Allergen Reactions  . Ace Inhibitors Cough    Pt can tolerate Tribenzor (and ARBs)  . Codeine Rash    Patient Measurements: Height: 5\' 11"  (180.3 cm) Weight: 199 lb 14.4 oz (90.7 kg) IBW/kg (Calculated) : 70.8 Heparin Dosing Weight: 90kg  Vital Signs: Temp: 98.4 F (36.9 C) (09/27 0605) Temp Source: Oral (09/27 0605) BP: 111/80 (09/27 0605) Pulse Rate: 84 (09/27 [id number])   Medical History: Past Medical History:  Diagnosis Date  . Acute CHF (congestive heart failure) ([place]) 07/04/2015  . Acute renal failure superimposed on stage 4 chronic kidney disease ([place]) 07/04/2015  . Allergic rhinitis   . Anxiety   . Constipation   . Diabetes mellitus   . GERD (gastroesophageal reflux disease)   . Heart murmur, systolic   . History of hysterectomy   . Hyperlipidemia   . Hypertension   . Low back pain   . Obesity   . Osteoarthritis   . Overactive bladder     Assessment: 70yo female tx'd from [facility] for CHF, also w/ known CAD with troponin up to 10. Pharmacy dosing heparin. Plans noted for cath.  -Heparin level= 0.56 and at goal on drip rate 950 uts/hr  Goal of Therapy:  Heparin level 0.3-0.7 units/ml Monitor platelets by anticoagulation protocol: Yes   Plan:  Continue Heparin drip 950 uts/hr Daily HL, CBC  Hildred Laser, Pharm D 12/02/2015 8:10 AM

## 2015-12-02 NOTE — H&P (View-Only) (Signed)
70 yo with history of CAD s/p multiple interventions and chronic subtotal LCx occlusion, right subclavian occlusion s/p thrombectomy, and poorly-controlled DM was transferred from [facility] ER with CHF. Patient has had a cough chronically for about a month now.  She drives a school bus.  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 She developed shortness of breath so went to the ER at [facility]. She was in pulmonary edema by CXR and got IV Lasix.  She was subsequently sent to [facility].   No chest pain.  No significant dyspnea prior to yesterday.  Felt better after getting IV Lasix in the ER.  She does not think that she has gained weight recently.    PMH: 1. R subclavian occlusion: s/p thrombectomy in 5/17.  2. CAD:  - Taxus DES to pLCx 2008. - NSTEMI 4/17 with subtotal LCx occlusion that was not intervened on.  Patient received DES to mid RCA and PDA. - Unstable angina 8/17 with subtotal LCx occlusion, 85% ISR in PDA treated with PTCA. 3. Ischemic cardiomyopathy:  - Echo (8/17) with EF 40-45%, regional WMAs, normal RV.  4. CKD stage III-IV 5. GERD 6. Hyperlipidemia 7. Low back pain 8. HTN 9. Type II diabetes  Pt seen this AM -- remains asymptomatic. Troponin trending down.  Lab Results  Component Value Date   TROPONINI 5.68 ([place]) 12/01/2015   Principal Problem:   NSTEMI (non-ST elevated myocardial infarction) ([place]) Active Problems:   Atherosclerosis of native coronary artery of native heart with unstable angina pectoris ([place])   Hyperlipemia   Essential hypertension   Type 2 diabetes mellitus with stage 4 chronic kidney disease (HCC)   Bilateral lower extremity edema   Subclavian artery stenosis, right   Acute on chronic systolic CHF (congestive heart failure) (HCC)   ASSESSMENT AND PLAN: 70 yo with history of CAD s/p multiple interventions and chronic subtotal LCx occlusion, right subclavian occlusion s/p thrombectomy, and  poorly-controlled DM was transferred from [facility] ER with CHF & NSTEMI. 1. CAD: Extensive history as documented in [place]. - Now with Troponin ~10-->6 c/w NSTEMI  Continue ASA and statin.   On Heparin gtt.  Renal Fx improved => will plan LHC-Angio +/- PCI today.  did have slight drop in H/H, but no evidence of beed - suspect that this is dilutional  2. Acute on chronic systolic CHF: Echo 0000000 with EF 40-45%, regional wall motion abnormalities.  Ischemic cardiomyopathy.  She is volume overloaded on exam.  She says she has been coughing for a month, so volume overload could have gradually accumulated or there could have been a trigger today (?ischemia).    Lasix 80 mg IV bid stopped after Troponin level increased. - Would restart home Demedex tomorrow (post cath)  Continue current hydralazine/Imdur.   Continue Toprol XL 50 bid.   No ACEI/ARB/spironolactone with elevated creatinine.   3. CKD stage 3-4: Creatinine 1.8 yesterday.  Follow closely with diuresis. She is followed by Dr Lorrene Reid as an outpatient.   Cr 1.7 today - baseline was ~1.5-1.6.  OK for Cath today, try to minimize contrast.  4. Diabetes: Hemoglobin A1c 10.  consult diabetes coordinator, poor control.   Is on Insulin @ home - close to home regimen here with SSI.  Probably needs higher basal insulin.  Remains on IV Heparin & gentle IV Fluids for worsening Cr on admit labs. (baseline ~1.6)  With prior R SubClavian disease - would probably avoid R Radial  Cath access. - consider Left.   Glenetta Hew, M.D., M.S. Interventional Cardiologist   Pager # [phone] Phone # [phone] [address] [place], [place] [phone]

## 2015-12-02 NOTE — Progress Notes (Addendum)
70 yo with history of CAD s/p multiple interventions and chronic subtotal LCx occlusion, right subclavian occlusion s/p thrombectomy, and poorly-controlled DM was transferred from [facility] ER with CHF. Patient has had a cough chronically for about a month now.  She drives a school bus.  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 She developed shortness of breath so went to the ER at [facility]. She was in pulmonary edema by CXR and got IV Lasix.  She was subsequently sent to [facility].   No chest pain.  No significant dyspnea prior to yesterday.  Felt better after getting IV Lasix in the ER.  She does not think that she has gained weight recently.    PMH: 1. R subclavian occlusion: s/p thrombectomy in 5/17.  2. CAD:  - Taxus DES to pLCx 2008. - NSTEMI 4/17 with subtotal LCx occlusion that was not intervened on.  Patient received DES to mid RCA and PDA. - Unstable angina 8/17 with subtotal LCx occlusion, 85% ISR in PDA treated with PTCA. 3. Ischemic cardiomyopathy:  - Echo (8/17) with EF 40-45%, regional WMAs, normal RV.  4. CKD stage III-IV 5. GERD 6. Hyperlipidemia 7. Low back pain 8. HTN 9. Type II diabetes  Pt seen this AM -- remains asymptomatic. Troponin trending down.  Lab Results  Component Value Date   TROPONINI 5.68 ([place]) 12/01/2015   Principal Problem:   NSTEMI (non-ST elevated myocardial infarction) ([place]) Active Problems:   Atherosclerosis of native coronary artery of native heart with unstable angina pectoris ([place])   Hyperlipemia   Essential hypertension   Type 2 diabetes mellitus with stage 4 chronic kidney disease (HCC)   Bilateral lower extremity edema   Subclavian artery stenosis, right   Acute on chronic systolic CHF (congestive heart failure) (HCC)   ASSESSMENT AND PLAN: 70 yo with history of CAD s/p multiple interventions and chronic subtotal LCx occlusion, right subclavian occlusion s/p thrombectomy, and  poorly-controlled DM was transferred from [facility] ER with CHF & NSTEMI. 1. CAD: Extensive history as documented in [place]. - Now with Troponin ~10-->6 c/w NSTEMI  Continue ASA and statin.   On Heparin gtt.  Renal Fx improved => will plan LHC-Angio +/- PCI today.  did have slight drop in H/H, but no evidence of beed - suspect that this is dilutional  2. Acute on chronic systolic CHF: Echo 0000000 with EF 40-45%, regional wall motion abnormalities.  Ischemic cardiomyopathy.  She is volume overloaded on exam.  She says she has been coughing for a month, so volume overload could have gradually accumulated or there could have been a trigger today (?ischemia).    Lasix 80 mg IV bid stopped after Troponin level increased. - Would restart home Demedex tomorrow (post cath)  Continue current hydralazine/Imdur.   Continue Toprol XL 50 bid.   No ACEI/ARB/spironolactone with elevated creatinine.   3. CKD stage 3-4: Creatinine 1.8 yesterday.  Follow closely with diuresis. She is followed by Dr Lorrene Reid as an outpatient.   Cr 1.7 today - baseline was ~1.5-1.6.  OK for Cath today, try to minimize contrast.  4. Diabetes: Hemoglobin A1c 10.  consult diabetes coordinator, poor control.   Is on Insulin @ home - close to home regimen here with SSI.  Probably needs higher basal insulin.  Remains on IV Heparin & gentle IV Fluids for worsening Cr on admit labs. (baseline ~1.6)  With prior R SubClavian disease - would probably avoid R Radial  Cath access. - consider Left.   Glenetta Hew, M.D., M.S. Interventional Cardiologist   Pager # [phone] Phone # [phone] [address] [place], [place] [address]

## 2015-12-03 ENCOUNTER — Encounter (HOSPITAL_COMMUNITY): Payer: Self-pay | Admitting: Cardiology

## 2015-12-03 ENCOUNTER — Inpatient Hospital Stay (HOSPITAL_COMMUNITY): Payer: BLUE CROSS/BLUE SHIELD

## 2015-12-03 DIAGNOSIS — I2510 Atherosclerotic heart disease of native coronary artery without angina pectoris: Secondary | ICD-10-CM

## 2015-12-03 DIAGNOSIS — I5043 Acute on chronic combined systolic (congestive) and diastolic (congestive) heart failure: Secondary | ICD-10-CM

## 2015-12-03 LAB — BASIC METABOLIC PANEL
ANION GAP: 7 (ref 5–15)
BUN: 30 mg/dL — ABNORMAL HIGH (ref 6–20)
CHLORIDE: 104 mmol/L (ref 101–111)
CO2: 26 mmol/L (ref 22–32)
CREATININE: 1.52 mg/dL — AB (ref 0.44–1.00)
Calcium: 10 mg/dL (ref 8.9–10.3)
GFR calc non Af Amer: 33 mL/min — ABNORMAL LOW (ref >60)
GFR, EST AFRICAN AMERICAN: 39 mL/min — AB (ref >60)
Glucose, Bld: 286 mg/dL — ABNORMAL HIGH (ref 65–99)
POTASSIUM: 4.9 mmol/L (ref 3.5–5.1)
SODIUM: 137 mmol/L (ref 135–145)

## 2015-12-03 LAB — ECHOCARDIOGRAM COMPLETE
Height: 71 in
Weight: 3262.4 oz

## 2015-12-03 LAB — CBC
HEMATOCRIT: 30.5 % — AB (ref 36.0–46.0)
HEMOGLOBIN: 9.5 g/dL — AB (ref 12.0–15.0)
MCH: 31.7 pg (ref 26.0–34.0)
MCHC: 31.1 g/dL (ref 30.0–36.0)
MCV: 101.7 fL — AB (ref 78.0–100.0)
Platelets: 141 10*3/uL — ABNORMAL LOW (ref 150–400)
RBC: 3 MIL/uL — AB (ref 3.87–5.11)
RDW: 15.6 % — ABNORMAL HIGH (ref 11.5–15.5)
WBC: 3.9 10*3/uL — AB (ref 4.0–10.5)

## 2015-12-03 LAB — GLUCOSE, CAPILLARY
Glucose-Capillary: 201 mg/dL — ABNORMAL HIGH (ref 65–99)
Glucose-Capillary: 293 mg/dL — ABNORMAL HIGH (ref 65–99)

## 2015-12-03 MED ORDER — HYDRALAZINE HCL 25 MG PO TABS: 37.5000 mg | ORAL_TABLET | Freq: Three times a day (TID) | ORAL | 12 refills | Status: DC

## 2015-12-03 MED ORDER — TORSEMIDE 20 MG PO TABS
60.0000 mg | ORAL_TABLET | Freq: Two times a day (BID) | ORAL | Status: DC
  Administered 2015-12-03: 60 mg via ORAL
  Filled 2015-12-03: qty 3

## 2015-12-03 MED ORDER — TORSEMIDE 20 MG PO TABS: ORAL_TABLET | ORAL | 12 refills | Status: DC

## 2015-12-03 MED FILL — Nitroglycerin IV Soln 100 MCG/ML in D5W: INTRA_ARTERIAL | Qty: 10 | Status: AC

## 2015-12-03 NOTE — Progress Notes (Signed)
70 yo with history of CAD s/p multiple interventions and chronic subtotal LCx occlusion, right subclavian occlusion s/p thrombectomy, and poorly-controlled DM was transferred from [facility] ER with CHF. Patient has had a cough chronically for about a month now.  She drives a school bus.  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 She developed shortness of breath so went to the ER at [facility]. She was in pulmonary edema by CXR and got IV Lasix.  She was subsequently sent to [facility].   No chest pain.  No significant dyspnea prior to yesterday.  Felt better after getting IV Lasix in the ER.  She does not think that she has gained weight recently.   Principal Problem:   NSTEMI (non-ST elevated myocardial infarction) ([place]) Active Problems:   Atherosclerosis of native coronary artery of native heart with unstable angina pectoris ([place])   Acute on chronic combined systolic and diastolic CHF (congestive heart failure) ([place])   Hyperlipemia   Essential hypertension   Type 2 diabetes mellitus with stage 4 chronic kidney disease (HCC)   Bilateral lower extremity edema   Subclavian artery stenosis, right  Pt seen this AM -- remains asymptomatic. Troponin trending down. No edema  Physical exam:  BP (!) 164/74 (BP Location: Right Arm)   Pulse (!) 58   Temp 98 F (36.7 C) (Oral)   Resp 18   Ht 5\' 11"  (1.803 m)   Wt 92.5 kg (203 lb 14.4 oz)   SpO2 99%   BMI 28.44 kg/m  Neck: JVP 8-9 cm, no thyromegaly or thyroid nodule.  Lungs: Crackles at bases bilaterally.  CV: Nondisplaced PMI.  Heart regular S1/S2, no S3/S4, no murmur.  1+ ankle edema.  No carotid bruit.  Normal pedal pulses.  Abdomen: Soft, nontender, no hepatosplenomegaly, no distention.  Skin: Intact without lesions or rashes.  Neurologic: Alert and oriented x 3.  Psych: Normal affect. Extremities: No clubbing or cyanosis.  HEENT: Normal.    Lab Results  Component Value Date   TROPONINI  5.68 ([place]) 12/01/2015   Lab Results  Component Value Date   CREATININE 1.72 ([place]) 12/02/2015  Pending this AM  CARDIAC CATH    Mid LAD lesion, 60 %stenosed.  Prox Cx to Mid Cx lesion, 99 %stenosed.  Mid RCA lesion, 0 %stenosed at site of prior stent  Dist RCA lesion, 30%stenosed at site of prior stent.  Prox RCA lesion, 40 %stenosed.  Ost Cx to Prox Cx lesion, 80 %stenosed.  LV end diastolic pressure is moderately elevated.   1. 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 Patent stents in the mid RCA and distal RCA/PDA 3. Moderate mid LAD disease- unchanged from prior studies. 4. Elevated LVEDP  Plan: medical therapy with diuresis for CHF.   Diagnostic Diagram     Principal Problem:   NSTEMI (non-ST elevated myocardial infarction) ([place]) Active Problems:   Atherosclerosis of native coronary artery of native heart with unstable angina pectoris ([place])   Hyperlipemia   Essential hypertension   Type 2 diabetes mellitus with stage 4 chronic kidney disease (HCC)   Bilateral lower extremity edema   Subclavian artery stenosis, right   Acute on chronic systolic CHF (congestive heart failure) (HCC)   ASSESSMENT AND PLAN: 70 yo with history of CAD s/p multiple interventions and chronic subtotal LCx occlusion, right subclavian occlusion s/p thrombectomy, and poorly-controlled DM was transferred from [facility] ER with CHF & NSTEMI. Initially her  symptoms were thought to be related to acute on chronic combined systolic and diastolic heart failure. She does have significant CAD with an occluded circumflex. She was taken to cardiac catheterization lab yesterday and had relatively stable disease without a culprit lesion to explain her elevated troponin. Suspect that this could've been demand ischemia from heart failure exacerbation versus microvascular ischemia.  1. CAD: Extensive history as documented in [place]. - Now with Troponin ~10-->6 c/w NSTEMI, but no new  lesion found on catheterization.  Continue ASA and statin.   She is on stable dose of metoprolol as well as amlodipine  She is also on Imdur and Ranexa.  Will restart home dose torsemide  2. Acute on chronic systolic CHF: Echo 0000000 with EF 40-45%, regional wall motion abnormalities.  Ischemic cardiomyopathy.  She is volume overloaded on exam.  She says she has been coughing for a month, so volume overload could have gradually accumulated or there could have been a trigger today (?ischemia).    restart home Demedex tomorrow (post cath)  Continue current hydralazine/Imdur for afterload reduction.  Continue Toprol XL 50 bid.   No ACEI/ARB/spironolactone with elevated creatinine.    3. CKD stage 3-4: Creatinine 1.5 to today. Stable post-cath  4. Diabetes: Hemoglobin A1c 10.  consult diabetes coordinator, poor control.   Is on Insulin @ home - close to home regimen here with SSI.  Probably needs higher basal insulin.  With no change in coronary anatomy, I hope that she should really go home today. She still is an echocardiogram pending would like to ensure that there is no significant change in her overall EF. Plan is for her to walk around today to see how she is feeling as far as dyspnea and chest tightness is concerned. We are restarting her torsemide this morning. She takes 3 tablets the morning and 1 in the evening. Would also recommend when necessary additional one to 2 tablets in the afternoon for weight gain greater than 3 pounds from her dry weight.  Anticipate discharge this afternoon.   Glenetta Hew, M.D., M.S. Interventional Cardiologist   Pager # [phone] Phone # [phone] [address] [place], [place] [address]

## 2015-12-03 NOTE — Progress Notes (Signed)
Echocardiogram 2D Echocardiogram has been performed.  12/03/2015 3:11 PM Maudry Mayhew, BS, RVT, RDCS, RDMS

## 2015-12-03 NOTE — Discharge Summary (Signed)
Discharge Summary    Patient ID: [name],  MRN: [id number], DOB/AGE: Oct 01, 1945 70 y.o.  Admit date: 12/01/2015 Discharge date: 12/03/2015  Primary Care Provider: [facility] Primary Cardiologist: Dr. Bronson Ing  Discharge Diagnoses    Principal Problem:   NSTEMI (non-ST elevated myocardial infarction) [facility]) Active Problems:   Hyperlipemia   Essential hypertension   Atherosclerosis of native coronary artery of native heart with unstable angina pectoris ([place])   Type 2 diabetes mellitus with stage 4 chronic kidney disease (HCC)   Bilateral lower extremity edema   Acute on chronic combined systolic and diastolic CHF (congestive heart failure) (HCC)   Subclavian artery stenosis, right   Allergies Allergies  Allergen Reactions  . Ace Inhibitors Cough    Pt can tolerate Tribenzor (and ARBs)  . Codeine Rash    Diagnostic Studies/Procedures  Left Heart Cath and Coronary Angiography 12/02/15  Mid LAD lesion, 60 %stenosed.  Prox Cx to Mid Cx lesion, 99 %stenosed.  Mid RCA lesion, 0 %stenosed at site of prior stent  Dist RCA lesion, 30%stenosed at site of prior stent.  Prox RCA lesion, 40 %stenosed.  Ost Cx to Prox Cx lesion, 80 %stenosed.  LV end diastolic pressure is moderately elevated.   1. 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 Patent stents in the mid RCA and distal RCA/PDA 3. Moderate mid LAD disease- unchanged from prior studies. 4. Elevated LVEDP  Plan: medical therapy with diuresis for CHF.   _____________   History of Present Illness   [name] is a 70 yo with history of CAD s/p multiple interventions and chronic subtotal LCx occlusion, right subclavian occlusion s/p thrombectomy, and poorly-controlled DM was transferred from [facility] ER with CHF.   Patient has had a cough chronically for about a month now.  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 [name] developed shortness of breath so went to the ER at [facility]. [name] was in pulmonary edema by CXR and got IV Lasix. [name] was subsequently sent to [facility].   PMH: 1. R subclavian occlusion: s/p thrombectomy in 5/17.  2. CAD:  - Taxus DES to pLCx 2008. - NSTEMI 4/17 with subtotal LCx occlusion that was not intervened on.  Patient received DES to mid RCA and PDA. - Unstable angina 8/17 with subtotal LCx occlusion, 85% ISR in PDA treated with PTCA. 3. Ischemic cardiomyopathy:  - Echo (8/17) with EF 40-45%, regional WMAs, normal RV.  4. CKD stage III-IV 5. GERD 6. Hyperlipidemia 7. Low back pain 8. HTN 9. Type II diabetes  Hospital Course  [name] was admitted for further evaluation. [name] underwent left heart cath, full report above. Her CAD was stable from last cath, patent stents in the RCA, and stable known left circumflex chronic occlusion.   1. CAD: Extensive history as documented in [place]. - Now with Troponin ~10-->6 c/w NSTEMI, but no new lesion found on catheterization.  Continue ASA and statin.   [name] is on stable dose of metoprolol as well as amlodipine  [name] is also on Imdur and Ranexa.  Will restart home dose torsemide  2. Acute on chronic systolic CHF: Echo 0000000 with EF 40-45%, regional wall motion abnormalities. Ischemic cardiomyopathy. restart home Demedex   Continue current hydralazine/Imdur for afterload reduction.  Continue Toprol XL 50 bid.   No ACEI/ARB/spironolactone with elevated creatinine.   3. CKD stage 3-4: Creatinine 1.5 to  today. Stable post-cath  4. Diabetes: Hemoglobin A1c 10.   Per Dr. Ellyn Hack:  We are restarting her torsemide. [name] takes 3 tablets (60 mg) the morning and 1 (20mg ) in the evening. Would also recommend when necessary additional one to 2 tablets in the afternoon for weight gain greater than 3 pounds from her dry weight.  _____________  Discharge Vitals Blood pressure (!) 164/74, pulse (!) 58, temperature 98 F (36.7  C), temperature source Oral, resp. rate 18, height 5\' 11"  (1.803 m), weight 203 lb 14.4 oz (92.5 kg), SpO2 99 %.  Filed Weights   12/01/15 0500 12/02/15 0605 12/03/15 0600  Weight: 204 lb 14 oz (92.9 kg) 199 lb 14.4 oz (90.7 kg) 203 lb 14.4 oz (92.5 kg)    Labs & Radiologic Studies     CBC  Recent Labs  12/01/15 0156 12/02/15 0422 12/03/15 0408  WBC 5.0 4.9 3.9*  NEUTROABS 3.5  --   --   HGB 11.0* 9.2* 9.5*  HCT 34.7* 29.6* 30.5*  MCV 100.6* 100.7* 101.7*  PLT 172 147* [id number]*   Basic Metabolic Panel  Recent Labs  12/01/15 0156  12/02/15 0422 12/03/15 0855  NA 134*  < > 137 137  K 3.6  < > 3.8 4.9  CL 95*  < > 98* 104  CO2 28  < > 27 26  GLUCOSE 375*  < > 170* 286*  BUN 40*  < > 35* 30*  CREATININE 2.02*  < > 1.72* 1.52*  CALCIUM 9.9  < > 9.8 10.0  MG 2.5*  --   --   --   < > = values in this interval not displayed. Liver Function Tests  Recent Labs  12/01/15 0156  AST 55*  ALT 24  ALKPHOS 77  BILITOT 0.5  PROT 7.4  ALBUMIN 3.9  Cardiac Enzymes  Recent Labs  12/01/15 0156 12/01/15 0826 12/01/15 1255  TROPONINI 10.12* 6.00* 5.68*   Thyroid Function Tests  Recent Labs  12/01/15 0156  TSH 1.573    No results found.  Disposition   Pt is being discharged home today in good condition.  Follow-up Plans & Appointments    Follow-up Information    Jory Sims, NP Follow up on 12/10/2015.   Specialties:  Nurse Practitioner, Radiology, Cardiology Why:  at 3:30pm for hospital follow up  Contact information: [place] [address] [phone]          Discharge Instructions    Diet - low sodium heart healthy    Complete by:  As directed    Increase activity slowly    Complete by:  As directed       Discharge Medications   Current Discharge Medication List    CONTINUE these medications which have CHANGED   Details  hydrALAZINE (APRESOLINE) 25 MG tablet Take 1.5 tablets (37.5 mg total) by mouth 3 (three) times  daily. Qty: 135 tablet, Refills: 12    torsemide (DEMADEX) 20 MG tablet Take 3 tabs (60mg ) by mouth every morning & 1 tab (20mg ) mid day, can take an extra 20mg  daily if her weight increases by >3 pounds. Qty: 140 tablet, Refills: 12      CONTINUE these medications which have NOT CHANGED   Details  acetaminophen (TYLENOL) 500 MG tablet Take 2 tablets (1,000 mg total) by mouth every 6 (six) hours as needed (pain). Qty: 30 tablet, Refills: 0    ALPRAZolam (XANAX) 0.5 MG tablet Take 0.5 mg by mouth at bedtime as  needed for sleep.     amLODipine (NORVASC) 10 MG tablet Take 1 tablet (10 mg total) by mouth daily. Qty: 30 tablet, Refills: 6    aspirin EC 81 MG tablet Take 81 mg by mouth daily.    benzonatate (TESSALON) 100 MG capsule Take 100 mg by mouth daily as needed for cough.    calcitRIOL (ROCALTROL) 0.25 MCG capsule Take 0.25 mcg by mouth every Monday, Wednesday, and Friday.     famotidine (PEPCID) 20 MG tablet Take 20 mg by mouth daily.    ferrous sulfate 325 (65 FE) MG tablet Take 325 mg by mouth daily.    gabapentin (NEURONTIN) 100 MG capsule Take 100 mg by mouth daily as needed (pain).     glucose blood (ACCU-CHEK AVIVA) test strip Use to check blood glucose 4 times a day. E11.65 Qty: 150 each, Refills: 5    insulin degludec (TRESIBA FLEXTOUCH) 100 UNIT/ML SOPN FlexTouch Pen Inject 0.3 mLs (30 Units total) into the skin at bedtime. Qty: 3 pen, Refills: 2    insulin lispro (HUMALOG KWIKPEN) 100 UNIT/ML KiwkPen Inject 0.1-0.16 mLs (10-16 Units total) into the skin 3 (three) times daily. Qty: 15 mL, Refills: 11    isosorbide mononitrate (IMDUR) 60 MG 24 hr tablet Take 1 tablet (60 mg total) by mouth daily. Qty: 30 tablet, Refills: 0    latanoprost (XALATAN) 0.005 % ophthalmic solution Place 1 drop into both eyes at bedtime.     meclizine (ANTIVERT) 12.5 MG tablet Take 12.5 mg by mouth 2 (two) times daily.     metoprolol succinate (TOPROL-XL) 50 MG 24 hr tablet Take 1  tablet (50 mg total) by mouth 2 (two) times daily. Take with or immediately following a meal. Qty: 60 tablet, Refills: 5    Multiple Vitamin (MULTIVITAMIN WITH MINERALS) TABS tablet Take 1 tablet by mouth daily.    nitroGLYCERIN (NITROSTAT) 0.4 MG SL tablet Place 1 tablet (0.4 mg total) under the tongue every 5 (five) minutes as needed for chest pain. Qty: 25 tablet, Refills: 2    potassium chloride SA (K-DUR,KLOR-CON) 20 MEQ tablet Take 2 tablets (40 mEq total) by mouth daily. Qty: 60 tablet, Refills: 5    ranolazine (RANEXA) 500 MG 12 hr tablet Take 1 tablet (500 mg total) by mouth 2 (two) times daily. Qty: 60 tablet, Refills: 5    rosuvastatin (CRESTOR) 40 MG tablet Take 1 tablet (40 mg total) by mouth at bedtime. Qty: 30 tablet, Refills: 0    ticagrelor (BRILINTA) 90 MG TABS tablet Take 1 tablet (90 mg total) by mouth 2 (two) times daily. Qty: 60 tablet, Refills: 0    trimethoprim (TRIMPEX) 100 MG tablet Take 100 mg by mouth daily.     insulin aspart (NOVOLOG FLEXPEN) 100 UNIT/ML FlexPen Inject 10-16 Units into the skin 3 (three) times daily with meals. Qty: 15 mL, Refills: 2          Outstanding Labs/Studies   BMP, Echo   Duration of Discharge Encounter   Greater than 30 minutes including physician time.  Signed, Arbutus Leas NP 12/03/2015, 2:58 PM    I saw evaluated the patient this morning prior to discharge. Please see final progress note for full details.  With no change in coronary anatomy, I hope that [name] should really go home today. [name] still is an echocardiogram pending would like to ensure that there is no significant change in her overall EF - -but this can be done as an outpatient. Plan is for  her to walk around today to see how [name] is feeling as far as dyspnea and chest tightness is concerned. We are restarting her torsemide this morning. [name] takes 3 tablets the morning and 1 in the evening. Would also recommend when necessary additional one to 2 tablets in  the afternoon for weight gain greater than 3 pounds from her dry weight.  Otherwise stable for discharge. The echocardiogram was not yet done, but can be done as an outpatient.   Glenetta Hew, M.D., M.S. Interventional Cardiologist   Pager # [phone] Phone # [phone] [address] [place], [place] [address]

## 2015-12-07 ENCOUNTER — Telehealth: Payer: Self-pay | Admitting: Cardiovascular Disease

## 2015-12-07 NOTE — Telephone Encounter (Signed)
[name] called stating that she was discharged on 12-03-15 from [facility]. She needs to see if she can get an extended note for her job to return to work on October 9th.  Please advise.

## 2015-12-08 ENCOUNTER — Other Ambulatory Visit (HOSPITAL_COMMUNITY): Payer: BLUE CROSS/BLUE SHIELD

## 2015-12-08 ENCOUNTER — Encounter: Payer: Self-pay | Admitting: *Deleted

## 2015-12-08 NOTE — Telephone Encounter (Signed)
Pt will pick up letter later today

## 2015-12-08 NOTE — Telephone Encounter (Signed)
That would be fine 

## 2015-12-10 ENCOUNTER — Encounter: Payer: Medicare Other | Admitting: Adult Health

## 2015-12-10 ENCOUNTER — Telehealth: Payer: Self-pay

## 2015-12-10 NOTE — Telephone Encounter (Signed)
Pt states she has had high BG readings.   Date Before breakfast Before lunch Before supper Bedtime  10-3 [phone]  10-4 [phone] HI  10-5 484             Pt taking: Tresiba 30 units qhs & Novolog 10-16 units tidac. She states that she started taking Hydrocodone cough syrup on 10-3 and this is when her readings started going up.

## 2015-12-10 NOTE — Telephone Encounter (Signed)
Advise to increase Tresiba to 50 units daily at bedtime and increase NovoLog to 15 3 times a day before meals plus sliding scale. And call back if readings are greater than 300x3.

## 2015-12-11 ENCOUNTER — Ambulatory Visit (INDEPENDENT_AMBULATORY_CARE_PROVIDER_SITE_OTHER): Payer: Medicare Other | Admitting: Cardiovascular Disease

## 2015-12-11 ENCOUNTER — Encounter: Payer: Self-pay | Admitting: Cardiovascular Disease

## 2015-12-11 VITALS — BP 138/64 | HR 61 | Ht 71.0 in | Wt 203.0 lb

## 2015-12-11 DIAGNOSIS — Z955 Presence of coronary angioplasty implant and graft: Secondary | ICD-10-CM

## 2015-12-11 DIAGNOSIS — I5042 Chronic combined systolic (congestive) and diastolic (congestive) heart failure: Secondary | ICD-10-CM

## 2015-12-11 DIAGNOSIS — Z9289 Personal history of other medical treatment: Secondary | ICD-10-CM

## 2015-12-11 DIAGNOSIS — I748 Embolism and thrombosis of other arteries: Secondary | ICD-10-CM

## 2015-12-11 DIAGNOSIS — I214 Non-ST elevation (NSTEMI) myocardial infarction: Secondary | ICD-10-CM

## 2015-12-11 DIAGNOSIS — E7800 Pure hypercholesterolemia, unspecified: Secondary | ICD-10-CM | POA: Diagnosis not present

## 2015-12-11 DIAGNOSIS — I998 Other disorder of circulatory system: Secondary | ICD-10-CM

## 2015-12-11 DIAGNOSIS — I779 Disorder of arteries and arterioles, unspecified: Secondary | ICD-10-CM

## 2015-12-11 DIAGNOSIS — I10 Essential (primary) hypertension: Secondary | ICD-10-CM

## 2015-12-11 DIAGNOSIS — I739 Peripheral vascular disease, unspecified: Secondary | ICD-10-CM

## 2015-12-11 NOTE — Telephone Encounter (Signed)
Pt notified and agrees. 

## 2015-12-11 NOTE — Progress Notes (Signed)
SUBJECTIVE: The patient returns for follow-up after being hospitalized for a non-STEMI and congestive heart failure.  Coronary angiography demonstrated the following:  Left Heart Cath and Coronary Angiography 12/02/15  Mid LAD lesion, 60 %stenosed.  Prox Cx to Mid Cx lesion, 99 %stenosed.  Mid RCA lesion, 0 %stenosed at site of prior stent  Dist RCA lesion, 30%stenosed at site of prior stent.  Prox RCA lesion, 40 %stenosed.  Ost Cx to Prox Cx lesion, 80 %stenosed.  LV end diastolic pressure is moderately elevated.  1. 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 Patent stents in the mid RCA and distal RCA/PDA 3. Moderate mid LAD disease- unchanged from prior studies. 4. Elevated LVEDP  Echo 11-12-15 LVEF [id number], grade 2 diastolic dysfunction.  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  Her ischemic symptoms are back pain and SOB, both of which she denies at present.   Said she ate a sausage and gravy biscuit the day of her most recent CHF exacerbation and believes "that's what did it". Said she hadn't told anyone until now.   Review of Systems: As per "subjective", otherwise negative.  Allergies  Allergen Reactions  . Ace Inhibitors Cough    Pt can tolerate Tribenzor (and ARBs)  . Codeine Rash    Current Outpatient Prescriptions  Medication Sig Dispense Refill  . acetaminophen (TYLENOL) 500 MG tablet Take 2 tablets (1,000 mg total) by mouth every 6 (six) hours as needed (pain). 30 tablet 0  . ALPRAZolam (XANAX) 0.5 MG tablet Take 0.5 mg by mouth at bedtime as needed for sleep.     Marland Kitchen amLODipine (NORVASC) 10 MG tablet Take 1 tablet (10 mg total) by mouth daily. 30 tablet 6  . aspirin EC 81 MG tablet Take 81 mg by mouth daily.    . benzonatate (TESSALON) 100 MG capsule Take 100 mg by mouth  daily as needed for cough.    . calcitRIOL (ROCALTROL) 0.25 MCG capsule Take 0.25 mcg by mouth every Monday, Wednesday, and Friday.     . famotidine (PEPCID) 20 MG tablet Take 20 mg by mouth daily.    . ferrous sulfate 325 (65 FE) MG tablet Take 325 mg by mouth daily.    Marland Kitchen gabapentin (NEURONTIN) 100 MG capsule Take 100 mg by mouth daily as needed (pain).     Marland Kitchen glucose blood (ACCU-CHEK AVIVA) test strip Use to check blood glucose 4 times a day. E11.65 150 each 5  . hydrALAZINE (APRESOLINE) 25 MG tablet Take 1.5 tablets (37.5 mg total) by mouth 3 (three) times daily. 135 tablet 12  . insulin aspart (NOVOLOG FLEXPEN) 100 UNIT/ML FlexPen Inject 10-16 Units into the skin 3 (three) times daily with meals. 15 mL 2  . insulin degludec (TRESIBA FLEXTOUCH) 100 UNIT/ML SOPN FlexTouch Pen Inject 0.3 mLs (30 Units total) into the skin at bedtime. 3 pen 2  . insulin lispro (HUMALOG KWIKPEN) 100 UNIT/ML KiwkPen Inject 0.1-0.16 mLs (10-16 Units total) into the skin 3 (three) times daily. (Patient taking differently: Inject 10-16 Units into the skin 3 (three) times daily before meals. Per sliding scale) 15 mL 11  . isosorbide mononitrate (IMDUR) 60 MG 24 hr tablet Take 1 tablet (60 mg total) by mouth daily. 30 tablet 0  . latanoprost (XALATAN) 0.005 % ophthalmic solution Place 1 drop into both eyes at bedtime.     Marland Kitchen  meclizine (ANTIVERT) 12.5 MG tablet Take 12.5 mg by mouth 2 (two) times daily.     . metoprolol succinate (TOPROL-XL) 50 MG 24 hr tablet Take 1 tablet (50 mg total) by mouth 2 (two) times daily. Take with or immediately following a meal. 60 tablet 5  . Multiple Vitamin (MULTIVITAMIN WITH MINERALS) TABS tablet Take 1 tablet by mouth daily.    . nitroGLYCERIN (NITROSTAT) 0.4 MG SL tablet Place 1 tablet (0.4 mg total) under the tongue every 5 (five) minutes as needed for chest pain. 25 tablet 2  . potassium chloride SA (K-DUR,KLOR-CON) 20 MEQ tablet Take 2 tablets (40 mEq total) by mouth daily. 60 tablet 5    . ranolazine (RANEXA) 500 MG 12 hr tablet Take 1 tablet (500 mg total) by mouth 2 (two) times daily. 60 tablet 5  . rosuvastatin (CRESTOR) 40 MG tablet Take 1 tablet (40 mg total) by mouth at bedtime. 30 tablet 0  . ticagrelor (BRILINTA) 90 MG TABS tablet Take 1 tablet (90 mg total) by mouth 2 (two) times daily. 60 tablet 0  . torsemide (DEMADEX) 20 MG tablet Take 3 tabs (60mg ) by mouth every morning & 1 tab (20mg ) mid day, can take an extra 20mg  daily if her weight increases by >3 pounds. 140 tablet 12  . trimethoprim (TRIMPEX) 100 MG tablet Take 100 mg by mouth daily.      No current facility-administered medications for this visit.     Past Medical History:  Diagnosis Date  . Acute CHF (congestive heart failure) ([place]) 07/04/2015  . Acute renal failure superimposed on stage 4 chronic kidney disease ([place]) 07/04/2015  . Allergic rhinitis   . Anxiety   . Constipation   . Diabetes mellitus   . GERD (gastroesophageal reflux disease)   . Heart murmur, systolic   . History of hysterectomy   . Hyperlipidemia   . Hypertension   . Low back pain   . Obesity   . Osteoarthritis   . Overactive bladder     Past Surgical History:  Procedure Laterality Date  . ABDOMINAL HYSTERECTOMY    . BACK SURGERY     multiple  . CARDIAC CATHETERIZATION  04/04/2006   Est EF of 60%  . CARDIAC CATHETERIZATION N/A 07/04/2015   Procedure: Left Heart Cath and Coronary Angiography;  Surgeon: Troy Sine, MD;  Location: [facility];  Service: Cardiovascular;  Laterality: N/A;  . CARDIAC CATHETERIZATION N/A 07/04/2015   Procedure: Coronary Stent Intervention;  Surgeon: Troy Sine, MD;  Location: [facility];  Service: Cardiovascular;  Laterality: N/A;  . CARDIAC CATHETERIZATION N/A 10/13/2015   Procedure: Left Heart Cath and Coronary Angiography;  Surgeon: Troy Sine, MD;  Location: [facility];  Service: Cardiovascular;  Laterality: N/A;  . CARDIAC CATHETERIZATION N/A 12/02/2015    Procedure: Left Heart Cath and Coronary Angiography;  Surgeon: Peter M Martinique, MD;  Location: [facility];  Service: Cardiovascular;  Laterality: N/A;  . CARPAL TUNNEL RELEASE    . CERVICAL BIOPSY     cervical lymph node biopsies  . COLONOSCOPY  May 2002   Dr. Irving Shows :Followup in 5 years, normal exam  . COLONOSCOPY  2008   Dr. Laural Golden: Very redundant colon with mild melanosis coli, splenic flexure polyp biopsy with acute complaint of benign colon polyp. Recommended ten-year followup  . CORONARY STENT PLACEMENT  04/11/2006   2 -- Taxus stents to the circumflex   . PERIPHERAL VASCULAR CATHETERIZATION Right  07/09/2015   Procedure: Upper Extremity Angiography;  Surgeon: Conrad , MD;  Location: [facility];  Service: Cardiovascular;  Laterality: Right;  . PERIPHERAL VASCULAR CATHETERIZATION Right 07/10/2015   Procedure: RIGHT SUBCLAVIAN ARTERY THROMBECTOMY;  Surgeon: Serafina Mitchell, MD;  Location: [facility];  Service: Vascular;  Laterality: Right;  . tendonitis     bilateral elbow  . TRIGGER FINGER RELEASE      Social History   Social History  . Marital status: Married    Spouse name: N/A  . Number of children: 3  . Years of education: N/A   Occupational History  . Not on file.   Social History Main Topics  . Smoking status: Never Smoker  . Smokeless tobacco: Never Used  . Alcohol use No  . Drug use: No  . Sexual activity: Not on file   Other Topics Concern  . Not on file   Social History Narrative  . No narrative on file     Vitals:   12/11/15 1133  BP: 138/64  Pulse: 61  SpO2: 97%  Weight: 203 lb (92.1 kg)  Height: 5\' 11"  (1.803 m)    PHYSICAL EXAM General: NAD HEENT: Normal. Neck: No JVD, no thyromegaly. Lungs: Clear to auscultation bilaterally with normal respiratory effort. CV: Nondisplaced PMI. Regular rate and rhythm, normal S1/S2, no [id number], soft systolic murmur over LUSB. Trace periankle edema.  Abdomen: Soft, obese.  Neurologic:  Alert and oriented.  Psych: Normal affect. Skin: Normal.     ECG: Most recent ECG reviewed.      ASSESSMENT AND PLAN: 1. CAD s/p NSTEMI with PDA and RCA stenting and CTO of left circumflex with RCA in-stent restenosis s/p PCI 10/2015: Symptomatically stable. Continue dual antiplatelet therapy indefinitely with aspirin and Brilinta. Continue metoprolol, nitrates, Ranexa, and Crestor.   2. Essential HTN: Controlled. No changes.  3. Hyperlipidemia: Continue Crestor 40 mg.  4. Bilateral carotid artery stenosis: Mild. Will repeat in 2 years. Continue aspirin and statin.  5. 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  6. Chronic systolic heart failure: Euvolemic. Continue torsemide 60 mg/20 mg.  Dispo: fu 3 months.   Kate Sable, M.D., F.A.C.C.

## 2015-12-11 NOTE — Patient Instructions (Signed)
Your physician recommends that you schedule a follow-up appointment in: 3 months with Dr. Koneswaran  Your physician recommends that you continue on your current medications as directed. Please refer to the Current Medication list given to you today.  Thank you for choosing [facility]!    

## 2015-12-11 NOTE — Telephone Encounter (Signed)
No answer.Voicemail is full. °

## 2015-12-11 NOTE — Telephone Encounter (Signed)
No answer. Voicemail full.

## 2015-12-12 ENCOUNTER — Other Ambulatory Visit: Payer: Self-pay | Admitting: "Endocrinology

## 2015-12-13 ENCOUNTER — Emergency Department (HOSPITAL_COMMUNITY): Payer: BLUE CROSS/BLUE SHIELD

## 2015-12-13 ENCOUNTER — Emergency Department (HOSPITAL_COMMUNITY)
Admission: EM | Admit: 2015-12-13 | Discharge: 2015-12-14 | Disposition: A | Discharge status: Home / Self Care | Payer: BLUE CROSS/BLUE SHIELD | Attending: Emergency Medicine | Admitting: Emergency Medicine

## 2015-12-13 ENCOUNTER — Encounter (HOSPITAL_COMMUNITY): Payer: Self-pay

## 2015-12-13 DIAGNOSIS — R109 Unspecified abdominal pain: Secondary | ICD-10-CM | POA: Diagnosis not present

## 2015-12-13 DIAGNOSIS — N184 Chronic kidney disease, stage 4 (severe): Secondary | ICD-10-CM | POA: Diagnosis not present

## 2015-12-13 DIAGNOSIS — K5900 Constipation, unspecified: Secondary | ICD-10-CM | POA: Insufficient documentation

## 2015-12-13 DIAGNOSIS — Z79899 Other long term (current) drug therapy: Secondary | ICD-10-CM | POA: Diagnosis not present

## 2015-12-13 DIAGNOSIS — I2510 Atherosclerotic heart disease of native coronary artery without angina pectoris: Secondary | ICD-10-CM | POA: Diagnosis not present

## 2015-12-13 DIAGNOSIS — Z7982 Long term (current) use of aspirin: Secondary | ICD-10-CM | POA: Insufficient documentation

## 2015-12-13 DIAGNOSIS — E1122 Type 2 diabetes mellitus with diabetic chronic kidney disease: Secondary | ICD-10-CM | POA: Insufficient documentation

## 2015-12-13 DIAGNOSIS — I5023 Acute on chronic systolic (congestive) heart failure: Secondary | ICD-10-CM | POA: Diagnosis not present

## 2015-12-13 DIAGNOSIS — I130 Hypertensive heart and chronic kidney disease with heart failure and stage 1 through stage 4 chronic kidney disease, or unspecified chronic kidney disease: Secondary | ICD-10-CM | POA: Insufficient documentation

## 2015-12-13 DIAGNOSIS — Z794 Long term (current) use of insulin: Secondary | ICD-10-CM | POA: Diagnosis not present

## 2015-12-13 MED ORDER — SORBITOL 70 % SOLN
960.0000 mL | TOPICAL_OIL | Freq: Once | ORAL | Status: DC
  Filled 2015-12-13: qty 240

## 2015-12-13 NOTE — ED Triage Notes (Signed)
Started having problems with constipation yesterday.  I went a little bit yesterday and today I have been unable to go.  I have used prune juice, a laxative, and that did not help.  Bowels normally move every other day.

## 2015-12-14 MED ORDER — MAGNESIUM CITRATE PO SOLN: 0.5000 | Freq: Once | ORAL | 0 refills | Status: AC

## 2015-12-14 MED ORDER — MINERAL OIL RE ENEM
1.0000 | ENEMA | Freq: Once | RECTAL | Status: AC
  Administered 2015-12-14: 1 via RECTAL
  Filled 2015-12-14: qty 1

## 2015-12-14 NOTE — Discharge Instructions (Signed)
Read the information below.  You are constipated. You were able to have a bowel movement while in the ED following an enema. You are being prescribed magnesium citrate. You can take half the bottle to further help relieve constipation.  I also encourage you to take miralax 1 tablespoon in 8oz of water once daily for the next week Use the prescribed medication as directed.  Please discuss all new medications with your pharmacist.   Be sure to follow up with your primary provider for re-evaluation.  You may return to the Emergency Department at any time for worsening condition or any new symptoms that concern you. Return to ED if you develop fever, abdominal pain, vomiting, blood in stool, or unable to have a bowel movement.

## 2015-12-14 NOTE — ED Provider Notes (Signed)
[facility] DEPT Provider Note   CSN: [id number] Arrival date & time: 12/13/15  1955     History   Chief Complaint Chief Complaint  Patient presents with  . Constipation    HPI [name] is a 69 y.o. female.  [name] is a 70 y.o. female with h/o systolic CHF, anxiety, constipation, T2DM, GERD, HLD, HTN, CAD obesity, OAB, and OA presents to ED with complaint of constipation. Patient reports she has a history of constipation. She normally has a BM every other day. She reports last good bowel movement was 2022/06/14. States she has passed a "few pellets" but has to "strain." She endorses mild left abdominal discomfort. She is passing flatus. She has tried prune juice, coffee, OTC laxative with minimal relief. Denies fever, N/V, blood in stool, dysuria, hematuria, CP, or SOB.       Past Medical History:  Diagnosis Date  . Acute CHF (congestive heart failure) ([place]) 07/04/2015  . Acute renal failure superimposed on stage 4 chronic kidney disease ([place]) 07/04/2015  . Allergic rhinitis   . Anxiety   . Constipation   . Diabetes mellitus   . GERD (gastroesophageal reflux disease)   . Heart murmur, systolic   . History of hysterectomy   . Hyperlipidemia   . Hypertension   . Low back pain   . Obesity   . Osteoarthritis   . Overactive bladder     Patient Active Problem List   Diagnosis Date Noted  . Acute on chronic systolic CHF (congestive heart failure) ([place]) 12/01/2015  . AKI (acute kidney injury) ([place])   . Ischemia of upper extremity   . Right knee pain   . Subclavian artery stenosis, right ([place]) 07/07/2015  . Acute on chronic combined systolic and diastolic CHF (congestive heart failure) ([place]) 07/04/2015  . Elevated troponin 07/04/2015  . NSTEMI (non-ST elevated myocardial infarction) ([place]) 07/04/2015  . Elevated d-dimer 07/04/2015  . Acute respiratory failure with hypercapnia ([place])   . Bilateral lower extremity edema 01/20/2012  . 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 Overweight 07/20/2009  . Atherosclerosis of native coronary artery of native heart with unstable angina pectoris ([place]) 10/08/2008  . HEART MURMUR, SYSTOLIC [id number]  . SHOULDER PAIN 02/05/2007  . Hyperlipemia 04/11/2006  . ANXIETY 04/11/2006  . SYNDROME, CARPAL TUNNEL 04/11/2006  . Essential hypertension 04/11/2006  . ALLERGIC RHINITIS 04/11/2006  . GERD 04/11/2006  . CONSTIPATION NOS 04/11/2006  . OVERACTIVE BLADDER 04/11/2006  . OSTEOARTHRITIS 04/11/2006  . LOW BACK PAIN 04/11/2006    Past Surgical History:  Procedure Laterality Date  . ABDOMINAL HYSTERECTOMY    . BACK SURGERY     multiple  . CARDIAC CATHETERIZATION  04/04/2006   Est EF of 60%  . CARDIAC CATHETERIZATION N/A 07/04/2015   Procedure: Left Heart Cath and Coronary Angiography;  Surgeon: Troy Sine, MD;  Location: [facility];  Service: Cardiovascular;  Laterality: N/A;  . CARDIAC CATHETERIZATION N/A 07/04/2015   Procedure: Coronary Stent Intervention;  Surgeon: Troy Sine, MD;  Location: [facility];  Service: Cardiovascular;  Laterality: N/A;  . CARDIAC CATHETERIZATION N/A 10/13/2015   Procedure: Left Heart Cath and Coronary Angiography;  Surgeon: Troy Sine, MD;  Location: [facility];  Service: Cardiovascular;  Laterality: N/A;  . CARDIAC CATHETERIZATION N/A 12/02/2015   Procedure: Left Heart Cath and Coronary Angiography;  Surgeon: Peter M Martinique, MD;  Location: [facility];  Service: Cardiovascular;  Laterality: N/A;  . CARPAL TUNNEL RELEASE    . CERVICAL BIOPSY     cervical lymph node biopsies  . COLONOSCOPY  May 2002   Dr. Irving Shows :Followup in 5 years, normal exam  . COLONOSCOPY  2008   Dr. Laural Golden: Very redundant colon with mild melanosis coli, splenic flexure polyp biopsy with acute complaint of benign colon polyp. Recommended ten-year followup  . CORONARY STENT PLACEMENT  04/11/2006   2 -- Taxus  stents to the circumflex   . PERIPHERAL VASCULAR CATHETERIZATION Right 07/09/2015   Procedure: Upper Extremity Angiography;  Surgeon: Conrad San Patricio, MD;  Location: [facility];  Service: Cardiovascular;  Laterality: Right;  . PERIPHERAL VASCULAR CATHETERIZATION Right 07/10/2015   Procedure: RIGHT SUBCLAVIAN ARTERY THROMBECTOMY;  Surgeon: Serafina Mitchell, MD;  Location: [facility];  Service: Vascular;  Laterality: Right;  . tendonitis     bilateral elbow  . TRIGGER FINGER RELEASE      OB History    No data available       Home Medications    Prior to Admission medications   Medication Sig Start Date End Date Taking? Authorizing Provider  acetaminophen (TYLENOL) 500 MG tablet Take 2 tablets (1,000 mg total) by mouth every 6 (six) hours as needed (pain). 07/12/15   Lily Kocher, MD  ALPRAZolam Duanne Moron) 0.5 MG tablet Take 0.5 mg by mouth at bedtime as needed for sleep.     Historical Provider, MD  amLODipine (NORVASC) 10 MG tablet Take 1 tablet (10 mg total) by mouth daily. 07/17/15   Herminio Commons, MD  aspirin EC 81 MG tablet Take 81 mg by mouth daily.    Historical Provider, MD  benzonatate (TESSALON) 100 MG capsule Take 100 mg by mouth daily as needed for cough.    Historical Provider, MD  calcitRIOL (ROCALTROL) 0.25 MCG capsule Take 0.25 mcg by mouth every Monday, Wednesday, and Friday.  03/23/15   Historical Provider, MD  famotidine (PEPCID) 20 MG tablet Take 20 mg by mouth daily.    Historical Provider, MD  ferrous sulfate 325 (65 FE) MG tablet Take 325 mg by mouth daily.    Historical Provider, MD  gabapentin (NEURONTIN) 100 MG capsule Take 100 mg by mouth daily as needed (pain).     Historical Provider, MD  glucose blood (ACCU-CHEK AVIVA) test strip Use to check blood glucose 4 times a day. E11.65 07/02/15   Cassandria Anger, MD  hydrALAZINE (APRESOLINE) 25 MG tablet Take 1.5 tablets (37.5 mg total) by mouth 3 (three) times daily. 12/03/15   Arbutus Leas, NP  insulin aspart  (NOVOLOG FLEXPEN) 100 UNIT/ML FlexPen Inject 10-16 Units into the skin 3 (three) times daily with meals. 11/23/15   Cassandria Anger, MD  insulin degludec (TRESIBA FLEXTOUCH) 100 UNIT/ML SOPN FlexTouch Pen Inject 0.3 mLs (30 Units total) into the skin at bedtime. 11/23/15   Cassandria Anger, MD  insulin lispro (HUMALOG KWIKPEN) 100 UNIT/ML KiwkPen Inject 0.1-0.16 mLs (10-16 Units total) into the skin 3 (three) times daily. Patient taking differently: Inject 10-16 Units into the skin 3 (three) times daily before meals. Per sliding scale 11/23/15   Cassandria Anger, MD  isosorbide mononitrate (IMDUR) 60 MG 24 hr tablet Take 1 tablet (60 mg total) by mouth daily. 07/12/15   Lily Kocher, MD  latanoprost (XALATAN) 0.005 % ophthalmic solution Place 1 drop into both eyes at bedtime.  10/25/11   Historical Provider, MD  magnesium citrate SOLN  Take 148 mLs (0.5 Bottles total) by mouth once. 12/14/15 12/14/15  Roxanna Mew, PA-C  meclizine (ANTIVERT) 12.5 MG tablet Take 12.5 mg by mouth 2 (two) times daily.  11/14/11   Historical Provider, MD  metoprolol succinate (TOPROL-XL) 50 MG 24 hr tablet Take 1 tablet (50 mg total) by mouth 2 (two) times daily. Take with or immediately following a meal. 10/14/15   Brittainy Erie Noe, PA-C  Multiple Vitamin (MULTIVITAMIN WITH MINERALS) TABS tablet Take 1 tablet by mouth daily.    Historical Provider, MD  nitroGLYCERIN (NITROSTAT) 0.4 MG SL tablet Place 1 tablet (0.4 mg total) under the tongue every 5 (five) minutes as needed for chest pain. 10/14/15   Brittainy Erie Noe, PA-C  potassium chloride SA (K-DUR,KLOR-CON) 20 MEQ tablet Take 2 tablets (40 mEq total) by mouth daily. 10/14/15   Brittainy Erie Noe, PA-C  ranolazine (RANEXA) 500 MG 12 hr tablet Take 1 tablet (500 mg total) by mouth 2 (two) times daily. 10/14/15   Brittainy Erie Noe, PA-C  rosuvastatin (CRESTOR) 40 MG tablet Take 1 tablet (40 mg total) by mouth at bedtime. 07/12/15   Lily Kocher, MD  ticagrelor  (BRILINTA) 90 MG TABS tablet Take 1 tablet (90 mg total) by mouth 2 (two) times daily. 10/14/15   Brittainy Erie Noe, PA-C  torsemide (DEMADEX) 20 MG tablet Take 3 tabs (60mg ) by mouth every morning & 1 tab (20mg ) mid day, can take an extra 20mg  daily if her weight increases by >3 pounds. 12/03/15   Arbutus Leas, NP  trimethoprim (TRIMPEX) 100 MG tablet Take 100 mg by mouth daily.  12/06/11   Historical Provider, MD    Family History Family History  Problem Relation Age of Onset  . Diabetes Father   . Hypertension Father   . Stroke Father   . Hypertension Brother   . Aneurysm Brother   . Diabetes Brother   . Colon cancer Neg Hx     Social History Social History  Substance Use Topics  . Smoking status: Never Smoker  . Smokeless tobacco: Never Used  . Alcohol use No     Allergies   Ace inhibitors and Codeine   Review of Systems Review of Systems  Constitutional: Negative for chills, diaphoresis and fever.  HENT: Negative for trouble swallowing.   Eyes: Negative for visual disturbance.  Respiratory: Negative for shortness of breath.   Cardiovascular: Negative for chest pain.  Gastrointestinal: Positive for abdominal pain ( mild left sided) and constipation. Negative for blood in stool, diarrhea, nausea and vomiting.  Genitourinary: Negative for dysuria and hematuria.  Musculoskeletal: Negative for myalgias.  Skin: Negative for rash.  Neurological: Negative for syncope.     Physical Exam Updated Vital Signs BP 142/78 (BP Location: Right Arm)   Pulse 65   Temp 98.3 F (36.8 C) (Oral)   Resp 17   Ht 5\' 11"  (1.803 m)   Wt 92.1 kg   SpO2 100%   BMI 28.31 kg/m   Physical Exam  Constitutional: She appears well-developed and well-nourished. No distress.  HENT:  Head: Normocephalic and atraumatic.  Mouth/Throat: Oropharynx is clear and moist. No oropharyngeal exudate.  Eyes: Conjunctivae and EOM are normal. Pupils are equal, round, and reactive to light. Right eye  exhibits no discharge. Left eye exhibits no discharge. No scleral icterus.  Neck: Normal range of motion and phonation normal. Neck supple. No neck rigidity. Normal range of motion present.  Cardiovascular: Normal rate, regular rhythm, normal heart sounds and intact  distal pulses.   No murmur heard. Pulmonary/Chest: Effort normal and breath sounds normal. No stridor. No respiratory distress. She has no wheezes. She has no rales.  Abdominal: Soft. Bowel sounds are normal. She exhibits no distension. There is no tenderness. There is no rigidity, no rebound, no guarding and no CVA tenderness.  Genitourinary: Pelvic exam was performed with patient supine.  Genitourinary Comments: No external hemorrhoids or fissures noted. Rectal tone is normal. No tenderness. No masses palpated. Stool noted in rectal vault. Stool is dark brown in color.   Musculoskeletal: Normal range of motion.  Lymphadenopathy:    She has no cervical adenopathy.  Neurological: She is alert. She is not disoriented. Coordination and gait normal. GCS eye subscore is 4. GCS verbal subscore is 5. GCS motor subscore is 6.  Skin: Skin is warm and dry. She is not diaphoretic.  Psychiatric: She has a normal mood and affect. Her behavior is normal.     ED Treatments / Results  Labs (all labs ordered are listed, but only abnormal results are displayed) Labs Reviewed - No data to display  EKG  EKG Interpretation None       Radiology Dg Abd 2 Views  Result Date: 12/13/2015 CLINICAL DATA:  Constipation for 2 days. EXAM: ABDOMEN - 2 VIEW COMPARISON:  Radiograph 03/21/2015 FINDINGS: Moderate to large stool burden throughout the entire colon. No definite rectal distention. No small bowel dilatation. No evidence of free air. Vascular calcifications noted throughout the abdomen. Lung bases are clear. Intact osseous structures. IMPRESSION: Moderate to large stool burden throughout the colon consistent with constipation. Electronically  Signed   By: Jeb Levering M.D.   On: 12/13/2015 23:48    Procedures Procedures (including critical care time)  Medications Ordered in ED Medications  mineral oil enema 1 enema (1 enema Rectal Given 12/14/15 0014)     Initial Impression / Assessment and Plan / ED Course  I have reviewed the triage vital signs and the nursing notes.  Pertinent labs & imaging results that were available during my care of the patient were reviewed by me and considered in my medical decision making (see chart for details).  Clinical Course  Value Comment By Time  DG Abd 2 Views Stool noted Roxanna Mew, PA-C 10/09 0000   On re-evaluation patient endorses bowel movement.  Roxanna Mew, [place] 10/09 0206    Patient presents to ED with complaint of constipation. Patient is afebrile and non-toxic appearing in NAD. VSS. Benign abdominal exam - soft, non-tender, + bowel sounds. Abdominal xray remarkable for moderate to large stool burden; no distension or free air noted. Stool noted in rectal vault; however, unable to effectively disimpact manually. Enema given with some success. Patient endorses BM. Rx for magnesium citrate; discussed use of miralax as well. Encouraged follow up with PCP. Return precautions given. Patient voiced understanding and is agreeable.     Final Clinical Impressions(s) / ED Diagnoses   Final diagnoses:  Constipation, unspecified constipation type    New Prescriptions New Prescriptions   MAGNESIUM CITRATE SOLN    Take 148 mLs (0.5 Bottles total) by mouth once.     Roxanna Mew, PA-C 12/14/15 0210    Ezequiel Essex, MD 12/14/15 [phone]

## 2015-12-16 ENCOUNTER — Encounter: Payer: Medicare Other | Admitting: Physician Assistant

## 2015-12-24 ENCOUNTER — Telehealth: Payer: Self-pay | Admitting: *Deleted

## 2015-12-24 NOTE — Telephone Encounter (Signed)
Pt c/o swelling in legs and palpitations. Denies any weight gain/SOB/chest pain - says BP and HR are WNL. Concerned that amlodipine may be causing symptoms since increased to 10 mg. Will forward to Dr. Bronson Ing

## 2015-12-25 NOTE — Telephone Encounter (Signed)
When was it increased? She was on this in September when she was discharged and prior to then.

## 2015-12-25 NOTE — Telephone Encounter (Signed)
Please advise on symptoms of palps and swelling

## 2015-12-25 NOTE — Telephone Encounter (Signed)
May of this year

## 2015-12-25 NOTE — Telephone Encounter (Signed)
Can get 24 Holter monitor to evaluate for arrhythmias, or start with an ECG and nurse visit. Can take an extra 20 mg of torsemide for swelling.

## 2015-12-25 NOTE — Telephone Encounter (Signed)
Symptoms are unlikely to be associated with this medication.

## 2015-12-28 NOTE — Telephone Encounter (Signed)
Pt will come 10/25 for EKG - already taking extra torsemide. Added to nurse schedule

## 2015-12-30 ENCOUNTER — Ambulatory Visit (INDEPENDENT_AMBULATORY_CARE_PROVIDER_SITE_OTHER): Payer: BLUE CROSS/BLUE SHIELD | Admitting: *Deleted

## 2015-12-30 DIAGNOSIS — R002 Palpitations: Secondary | ICD-10-CM | POA: Diagnosis not present

## 2015-12-30 NOTE — Progress Notes (Signed)
Patient in office this morning for EKG.  EKG scanned into EPIC for Dr. Harl Bowie to review as Dr. Bronson Ing is out of the office.

## 2015-12-31 NOTE — Progress Notes (Signed)
EKG looks ok. Is she still having palpitations, and if so how often and how long do they last for?  Zandra Abts MD

## 2016-01-04 NOTE — Progress Notes (Signed)
Left message to return call 

## 2016-01-06 NOTE — Progress Notes (Signed)
Stated that she has had them for a very long time, at least the last 5 years.  Stated they are about the same, no worse.  Continues occasionally.  Stated her pmd had given a little Xanax to help with anxiety.  That helps some too.    Patient also questioning her Norvasc.  Noticing some swellng in feet and legs.  Was told by [place] that medication may be the cause.  Stated that pharm even suggested asking if she could change to brand Norvasc instead of using the Amlodipine to see if that would help.  Cost of brand my be an issue there.  Will send message to provider for further advice.

## 2016-01-08 ENCOUNTER — Telehealth: Payer: Self-pay | Admitting: Cardiology

## 2016-01-08 DIAGNOSIS — I5033 Acute on chronic diastolic (congestive) heart failure: Secondary | ICD-10-CM | POA: Diagnosis not present

## 2016-01-08 DIAGNOSIS — E1122 Type 2 diabetes mellitus with diabetic chronic kidney disease: Secondary | ICD-10-CM | POA: Diagnosis not present

## 2016-01-08 DIAGNOSIS — Z7982 Long term (current) use of aspirin: Secondary | ICD-10-CM | POA: Diagnosis not present

## 2016-01-08 DIAGNOSIS — Z888 Allergy status to other drugs, medicaments and biological substances status: Secondary | ICD-10-CM | POA: Diagnosis not present

## 2016-01-08 DIAGNOSIS — I130 Hypertensive heart and chronic kidney disease with heart failure and stage 1 through stage 4 chronic kidney disease, or unspecified chronic kidney disease: Secondary | ICD-10-CM | POA: Diagnosis not present

## 2016-01-08 DIAGNOSIS — I2510 Atherosclerotic heart disease of native coronary artery without angina pectoris: Secondary | ICD-10-CM | POA: Diagnosis not present

## 2016-01-08 DIAGNOSIS — Z8249 Family history of ischemic heart disease and other diseases of the circulatory system: Secondary | ICD-10-CM | POA: Diagnosis not present

## 2016-01-08 DIAGNOSIS — E1151 Type 2 diabetes mellitus with diabetic peripheral angiopathy without gangrene: Secondary | ICD-10-CM | POA: Diagnosis not present

## 2016-01-08 DIAGNOSIS — K219 Gastro-esophageal reflux disease without esophagitis: Secondary | ICD-10-CM | POA: Diagnosis not present

## 2016-01-08 DIAGNOSIS — Z886 Allergy status to analgesic agent status: Secondary | ICD-10-CM | POA: Diagnosis not present

## 2016-01-08 DIAGNOSIS — E7800 Pure hypercholesterolemia, unspecified: Secondary | ICD-10-CM | POA: Diagnosis not present

## 2016-01-08 DIAGNOSIS — Z79899 Other long term (current) drug therapy: Secondary | ICD-10-CM | POA: Diagnosis not present

## 2016-01-08 DIAGNOSIS — N183 Chronic kidney disease, stage 3 (moderate): Secondary | ICD-10-CM | POA: Diagnosis not present

## 2016-01-08 DIAGNOSIS — Z794 Long term (current) use of insulin: Secondary | ICD-10-CM | POA: Diagnosis not present

## 2016-01-08 DIAGNOSIS — I252 Old myocardial infarction: Secondary | ICD-10-CM | POA: Diagnosis not present

## 2016-01-08 NOTE — Telephone Encounter (Signed)
Received telephone call from Dr. Ronne Binning at [facility] regarding [name], a patient of Dr. Bronson Ing last seen in early October. I reviewed her records and the most recent office note. She presented to [facility] with what sounds like flash pulmonary edema and acute on chronic combined heart failure. Dr. Ronne Binning indicates that she improved fairly quickly with BiPAP and diuresis. There was a minor increase in troponin T that seems to be most consistent with heart failure rather than true ACS. She in fact just had a recent heart catheterization in September, I reviewed the results. Dr. Ronne Binning and I discussed the patient's medications and potential adjustments. He will also further investigate any potential dietary indiscretions which have been a pattern before. He plans to make a follow-up visit after the patient's discharge in the next few weeks.

## 2016-01-08 NOTE — Progress Notes (Signed)
Patient seen at [facility] today for flash pulmonary edema.  Dr. Ronne Binning spoke with Dr. Domenic Polite regarding her treatment.  She will have follow up at some point in office after she is discharged.

## 2016-01-10 ENCOUNTER — Encounter (HOSPITAL_COMMUNITY): Payer: Self-pay | Admitting: Emergency Medicine

## 2016-01-10 ENCOUNTER — Emergency Department (HOSPITAL_COMMUNITY)
Admission: EM | Admit: 2016-01-10 | Discharge: 2016-01-10 | Disposition: A | Discharge status: Home / Self Care | Payer: BLUE CROSS/BLUE SHIELD | Attending: Emergency Medicine | Admitting: Emergency Medicine

## 2016-01-10 ENCOUNTER — Emergency Department (HOSPITAL_COMMUNITY): Payer: BLUE CROSS/BLUE SHIELD

## 2016-01-10 DIAGNOSIS — Z794 Long term (current) use of insulin: Secondary | ICD-10-CM | POA: Insufficient documentation

## 2016-01-10 DIAGNOSIS — Z7982 Long term (current) use of aspirin: Secondary | ICD-10-CM | POA: Diagnosis not present

## 2016-01-10 DIAGNOSIS — I5043 Acute on chronic combined systolic (congestive) and diastolic (congestive) heart failure: Secondary | ICD-10-CM | POA: Insufficient documentation

## 2016-01-10 DIAGNOSIS — R0602 Shortness of breath: Secondary | ICD-10-CM | POA: Insufficient documentation

## 2016-01-10 DIAGNOSIS — E1122 Type 2 diabetes mellitus with diabetic chronic kidney disease: Secondary | ICD-10-CM | POA: Diagnosis not present

## 2016-01-10 DIAGNOSIS — I130 Hypertensive heart and chronic kidney disease with heart failure and stage 1 through stage 4 chronic kidney disease, or unspecified chronic kidney disease: Secondary | ICD-10-CM | POA: Diagnosis not present

## 2016-01-10 DIAGNOSIS — Z79899 Other long term (current) drug therapy: Secondary | ICD-10-CM | POA: Insufficient documentation

## 2016-01-10 DIAGNOSIS — N184 Chronic kidney disease, stage 4 (severe): Secondary | ICD-10-CM | POA: Insufficient documentation

## 2016-01-10 LAB — CBC WITH DIFFERENTIAL/PLATELET
Basophils Absolute: 0 10*3/uL (ref 0.0–0.1)
Basophils Relative: 0 %
Eosinophils Absolute: 0.1 10*3/uL (ref 0.0–0.7)
Eosinophils Relative: 2 %
HEMATOCRIT: 33.4 % — AB (ref 36.0–46.0)
Hemoglobin: 10.7 g/dL — ABNORMAL LOW (ref 12.0–15.0)
LYMPHS PCT: 17 %
Lymphs Abs: 1 10*3/uL (ref 0.7–4.0)
MCH: 32.9 pg (ref 26.0–34.0)
MCHC: 32 g/dL (ref 30.0–36.0)
MCV: 102.8 fL — AB (ref 78.0–100.0)
MONO ABS: 0.4 10*3/uL (ref 0.1–1.0)
MONOS PCT: 7 %
NEUTROS ABS: 4.3 10*3/uL (ref 1.7–7.7)
Neutrophils Relative %: 74 %
Platelets: 159 10*3/uL (ref 150–400)
RBC: 3.25 MIL/uL — ABNORMAL LOW (ref 3.87–5.11)
RDW: 15.2 % (ref 11.5–15.5)
WBC: 5.8 10*3/uL (ref 4.0–10.5)

## 2016-01-10 LAB — COMPREHENSIVE METABOLIC PANEL
ALK PHOS: 75 U/L (ref 38–126)
ALT: 26 U/L (ref 14–54)
AST: 35 U/L (ref 15–41)
Albumin: 4.1 g/dL (ref 3.5–5.0)
Anion gap: 6 (ref 5–15)
BUN: 44 mg/dL — AB (ref 6–20)
CALCIUM: 9.5 mg/dL (ref 8.9–10.3)
CHLORIDE: 101 mmol/L (ref 101–111)
CO2: 29 mmol/L (ref 22–32)
CREATININE: 1.57 mg/dL — AB (ref 0.44–1.00)
GFR calc Af Amer: 37 mL/min — ABNORMAL LOW (ref >60)
GFR, EST NON AFRICAN AMERICAN: 32 mL/min — AB (ref >60)
Glucose, Bld: 149 mg/dL — ABNORMAL HIGH (ref 65–99)
Potassium: 4.8 mmol/L (ref 3.5–5.1)
Sodium: 136 mmol/L (ref 135–145)
Total Bilirubin: 0.8 mg/dL (ref 0.3–1.2)
Total Protein: 7.9 g/dL (ref 6.5–8.1)

## 2016-01-10 LAB — TROPONIN I: Troponin I: 1.1 ng/mL (ref <0.03)

## 2016-01-10 LAB — BRAIN NATRIURETIC PEPTIDE: B NATRIURETIC PEPTIDE 5: 456 pg/mL — AB (ref 0.0–100.0)

## 2016-01-10 MED ORDER — FUROSEMIDE 10 MG/ML IJ SOLN
60.0000 mg | Freq: Once | INTRAMUSCULAR | Status: AC
  Administered 2016-01-10: 60 mg via INTRAVENOUS
  Filled 2016-01-10: qty 6

## 2016-01-10 MED ORDER — ACETAMINOPHEN 325 MG PO TABS
650.0000 mg | ORAL_TABLET | Freq: Once | ORAL | Status: AC
  Administered 2016-01-10: 650 mg via ORAL

## 2016-01-10 MED ORDER — NITROGLYCERIN 2 % TD OINT
1.0000 [in_us] | TOPICAL_OINTMENT | Freq: Once | TRANSDERMAL | Status: AC
  Administered 2016-01-10: 1 [in_us] via TOPICAL
  Filled 2016-01-10: qty 1

## 2016-01-10 MED ORDER — NITROGLYCERIN 2 % TD OINT
TOPICAL_OINTMENT | TRANSDERMAL | Status: AC
  Filled 2016-01-10: qty 1

## 2016-01-10 MED ORDER — FUROSEMIDE 10 MG/ML IJ SOLN: 60.0000 mg | Freq: Once | INTRAMUSCULAR | Status: DC

## 2016-01-10 MED ORDER — ACETAMINOPHEN 325 MG PO TABS
ORAL_TABLET | ORAL | Status: AC
  Filled 2016-01-10: qty 2

## 2016-01-10 NOTE — ED Notes (Signed)
CRITICAL VALUE ALERT  Critical value received: Troponin 1.10  Date of notification:  01/10/16  Time of notification:  0626  Critical value read back:Yes.    Nurse who received alert:  Lura Em, RN  MD notified (1st page):  Tomi Bamberger  Time of first page:  [phone]

## 2016-01-10 NOTE — ED Notes (Signed)
Pt tolerated ambulation well. O2 stayed at 100 during ambulation.

## 2016-01-10 NOTE — ED Provider Notes (Signed)
[facility] DEPT Provider Note   CSN: [id number] Arrival date & time: 01/10/16  0441  Time seen 04:44 AM   History   Chief Complaint Chief Complaint  Patient presents with  . Shortness of Breath    HPI CRYSTALMARIE DOMM is a 70 y.o. female.  HPI  Patient reports she was admitted to [facility] on November 3 for exacerbation of congestive heart failure. She reports she was given Lasix and lost 1 or 2 pounds. She was discharged yesterday evening at 5 PM. She states they increased her hydralazine from 37.5 mg 3 times a day to 50 mg 3 times a day. She states about 10 PM when she tried to go to bed she felt too short of breath to lay down. She states she normally sleeps on 2 pillows however she still could not sleep with 3 pillows. She then had to sit up. She denies any swelling of her extremities, abdominal swelling, chest pain or chest tightness, or fever. She states she has had some coughing with some white foamy mucus. She also feels like she has been wheezing. She reports a lifelong history of anxiety and thinks sometimes her anxiety starts when she has some difficulty breathing and the anxiety makes it worse.  PCP Dr. Berdine Addison Cardiologist Dr. Jacinta Shoe  Past Medical History:  Diagnosis Date  . Acute CHF (congestive heart failure) ([place]) 07/04/2015  . Acute renal failure superimposed on stage 4 chronic kidney disease ([place]) 07/04/2015  . Allergic rhinitis   . Anxiety   . Constipation   . Diabetes mellitus   . GERD (gastroesophageal reflux disease)   . Heart murmur, systolic   . History of hysterectomy   . Hyperlipidemia   . Hypertension   . Low back pain   . Obesity   . Osteoarthritis   . Overactive bladder     Patient Active Problem List   Diagnosis Date Noted  . Acute on chronic systolic CHF (congestive heart failure) ([place]) 12/01/2015  . AKI (acute kidney injury) ([place])   . Ischemia of upper extremity   . Right knee pain   . Subclavian artery stenosis, right  ([place]) 07/07/2015  . Acute on chronic combined systolic and diastolic CHF (congestive heart failure) ([place]) 07/04/2015  . Elevated troponin 07/04/2015  . NSTEMI (non-ST elevated myocardial infarction) ([place]) 07/04/2015  . Elevated d-dimer 07/04/2015  . Acute respiratory failure with hypercapnia ([place])   . Bilateral lower extremity edema 01/20/2012  . Type 2 diabetes mellitus with stage 4 chronic kidney disease ([place]) 01/21/2011  . Overweight 07/20/2009  . Atherosclerosis of native coronary artery of native heart with unstable angina pectoris ([place]) 10/08/2008  . HEART MURMUR, SYSTOLIC [id number]  . SHOULDER PAIN 02/05/2007  . Hyperlipemia 04/11/2006  . ANXIETY 04/11/2006  . SYNDROME, CARPAL TUNNEL 04/11/2006  . Essential hypertension 04/11/2006  . ALLERGIC RHINITIS 04/11/2006  . GERD 04/11/2006  . CONSTIPATION NOS 04/11/2006  . OVERACTIVE BLADDER 04/11/2006  . OSTEOARTHRITIS 04/11/2006  . LOW BACK PAIN 04/11/2006    Past Surgical History:  Procedure Laterality Date  . ABDOMINAL HYSTERECTOMY    . BACK SURGERY     multiple  . CARDIAC CATHETERIZATION  04/04/2006   Est EF of 60%  . CARDIAC CATHETERIZATION N/A 07/04/2015   Procedure: Left Heart Cath and Coronary Angiography;  Surgeon: Troy Sine, MD;  Location: [facility];  Service: Cardiovascular;  Laterality: N/A;  . CARDIAC CATHETERIZATION N/A 07/04/2015   Procedure: Coronary Stent Intervention;  Surgeon: Troy Sine,  MD;  Location: [facility] [facility];  Service: Cardiovascular;  Laterality: N/A;  . CARDIAC CATHETERIZATION N/A 10/13/2015   Procedure: Left Heart Cath and Coronary Angiography;  Surgeon: Troy Sine, MD;  Location: [facility];  Service: Cardiovascular;  Laterality: N/A;  . CARDIAC CATHETERIZATION N/A 12/02/2015   Procedure: Left Heart Cath and Coronary Angiography;  Surgeon: Peter M Martinique, MD;  Location: [facility];  Service: Cardiovascular;  Laterality: N/A;  . CARPAL TUNNEL RELEASE    .  CERVICAL BIOPSY     cervical lymph node biopsies  . COLONOSCOPY  May 2002   Dr. Irving Shows :Followup in 5 years, normal exam  . COLONOSCOPY  2008   Dr. Laural Golden: Very redundant colon with mild melanosis coli, splenic flexure polyp biopsy with acute complaint of benign colon polyp. Recommended ten-year followup  . CORONARY STENT PLACEMENT  04/11/2006   2 -- Taxus stents to the circumflex   . PERIPHERAL VASCULAR CATHETERIZATION Right 07/09/2015   Procedure: Upper Extremity Angiography;  Surgeon: Conrad , MD;  Location: [facility];  Service: Cardiovascular;  Laterality: Right;  . PERIPHERAL VASCULAR CATHETERIZATION Right 07/10/2015   Procedure: RIGHT SUBCLAVIAN ARTERY THROMBECTOMY;  Surgeon: Serafina Mitchell, MD;  Location: [facility] OR;  Service: Vascular;  Laterality: Right;  . tendonitis     bilateral elbow  . TRIGGER FINGER RELEASE      OB History    No data available       Home Medications    Prior to Admission medications   Medication Sig Start Date End Date Taking? Authorizing Provider  ACCU-CHEK AVIVA PLUS test strip USE TO CHECK BLOOD GLUCOSE 4 TIMES A DAY 12/14/15   Cassandria Anger, MD  acetaminophen (TYLENOL) 500 MG tablet Take 2 tablets (1,000 mg total) by mouth every 6 (six) hours as needed (pain). 07/12/15   Lily Kocher, MD  ALPRAZolam Duanne Moron) 0.5 MG tablet Take 0.5 mg by mouth at bedtime as needed for sleep.     Historical Provider, MD  amLODipine (NORVASC) 10 MG tablet Take 1 tablet (10 mg total) by mouth daily. 07/17/15   Herminio Commons, MD  aspirin EC 81 MG tablet Take 81 mg by mouth daily.    Historical Provider, MD  benzonatate (TESSALON) 100 MG capsule Take 100 mg by mouth daily as needed for cough.    Historical Provider, MD  calcitRIOL (ROCALTROL) 0.25 MCG capsule Take 0.25 mcg by mouth every Monday, Wednesday, and Friday.  03/23/15   Historical Provider, MD  famotidine (PEPCID) 20 MG tablet Take 20 mg by mouth daily.    Historical Provider, MD  ferrous  sulfate 325 (65 FE) MG tablet Take 325 mg by mouth daily.    Historical Provider, MD  gabapentin (NEURONTIN) 100 MG capsule Take 100 mg by mouth daily as needed (pain).     Historical Provider, MD  hydrALAZINE (APRESOLINE) 25 MG tablet Take 1.5 tablets (37.5 mg total) by mouth 3 (three) times daily. 12/03/15   Arbutus Leas, NP  insulin aspart (NOVOLOG FLEXPEN) 100 UNIT/ML FlexPen Inject 10-16 Units into the skin 3 (three) times daily with meals. 11/23/15   Cassandria Anger, MD  insulin degludec (TRESIBA FLEXTOUCH) 100 UNIT/ML SOPN FlexTouch Pen Inject 0.3 mLs (30 Units total) into the skin at bedtime. 11/23/15   Cassandria Anger, MD  insulin lispro (HUMALOG KWIKPEN) 100 UNIT/ML KiwkPen Inject 0.1-0.16 mLs (10-16 Units total) into the skin 3 (three) times daily. Patient taking differently:  Inject 10-16 Units into the skin 3 (three) times daily before meals. Per sliding scale 11/23/15   Cassandria Anger, MD  isosorbide mononitrate (IMDUR) 60 MG 24 hr tablet Take 1 tablet (60 mg total) by mouth daily. 07/12/15   Lily Kocher, MD  latanoprost (XALATAN) 0.005 % ophthalmic solution Place 1 drop into both eyes at bedtime.  10/25/11   Historical Provider, MD  meclizine (ANTIVERT) 12.5 MG tablet Take 12.5 mg by mouth 2 (two) times daily.  11/14/11   Historical Provider, MD  metoprolol succinate (TOPROL-XL) 50 MG 24 hr tablet Take 1 tablet (50 mg total) by mouth 2 (two) times daily. Take with or immediately following a meal. 10/14/15   Brittainy Erie Noe, PA-C  Multiple Vitamin (MULTIVITAMIN WITH MINERALS) TABS tablet Take 1 tablet by mouth daily.    Historical Provider, MD  nitroGLYCERIN (NITROSTAT) 0.4 MG SL tablet Place 1 tablet (0.4 mg total) under the tongue every 5 (five) minutes as needed for chest pain. 10/14/15   Brittainy Erie Noe, PA-C  potassium chloride SA (K-DUR,KLOR-CON) 20 MEQ tablet Take 2 tablets (40 mEq total) by mouth daily. 10/14/15   Brittainy Erie Noe, PA-C  ranolazine (RANEXA) 500 MG 12  hr tablet Take 1 tablet (500 mg total) by mouth 2 (two) times daily. 10/14/15   Brittainy Erie Noe, PA-C  rosuvastatin (CRESTOR) 40 MG tablet Take 1 tablet (40 mg total) by mouth at bedtime. 07/12/15   Lily Kocher, MD  ticagrelor (BRILINTA) 90 MG TABS tablet Take 1 tablet (90 mg total) by mouth 2 (two) times daily. 10/14/15   Brittainy Erie Noe, PA-C  torsemide (DEMADEX) 20 MG tablet Take 3 tabs (60mg ) by mouth every morning & 1 tab (20mg ) mid day, can take an extra 20mg  daily if her weight increases by >3 pounds. 12/03/15   Arbutus Leas, NP  trimethoprim (TRIMPEX) 100 MG tablet Take 100 mg by mouth daily.  12/06/11   Historical Provider, MD    Family History Family History  Problem Relation Age of Onset  . Diabetes Father   . Hypertension Father   . Stroke Father   . Hypertension Brother   . Aneurysm Brother   . Diabetes Brother   . Colon cancer Neg Hx     Social History Social History  Substance Use Topics  . Smoking status: Never Smoker  . Smokeless tobacco: Never Used  . Alcohol use No  lives with spouse   Allergies   Ace inhibitors and Codeine   Review of Systems Review of Systems  All other systems reviewed and are negative.    Physical Exam Updated Vital Signs BP 166/73 (BP Location: Left Arm)   Pulse 81   Temp 98.4 F (36.9 C) (Oral)   Resp 20   Ht 5\' 11"  (1.803 m)   Wt 201 lb (91.2 kg)   SpO2 97%   BMI 28.03 kg/m   Vital signs normal except hypertension   Physical Exam  Constitutional: She is oriented to person, place, and time. She appears well-developed and well-nourished.  Non-toxic appearance. She does not appear ill. No distress.  HENT:  Head: Normocephalic and atraumatic.  Right Ear: External ear normal.  Left Ear: External ear normal.  Nose: Nose normal. No mucosal edema or rhinorrhea.  Mouth/Throat: Oropharynx is clear and moist and mucous membranes are normal. No dental abscesses or uvula swelling.  Eyes: Conjunctivae and EOM are normal.  Pupils are equal, round, and reactive to light.  Neck: Normal range of motion  and full passive range of motion without pain. Neck supple.  Cardiovascular: Normal rate, regular rhythm and normal heart sounds.  Exam reveals no gallop and no friction rub.   No murmur heard. Pulmonary/Chest: Effort normal and breath sounds normal. No respiratory distress. She has no wheezes. She has no rhonchi. She has no rales. She exhibits no tenderness and no crepitus.  Abdominal: Soft. Normal appearance and bowel sounds are normal. She exhibits no distension. There is no tenderness. There is no rebound and no guarding.  Musculoskeletal: Normal range of motion. She exhibits no edema or tenderness.  Moves all extremities well.   Neurological: She is alert and oriented to person, place, and time. She has normal strength. No cranial nerve deficit.  Skin: Skin is warm, dry and intact. No rash noted. No erythema. No pallor.  Psychiatric: She has a normal mood and affect. Her speech is normal and behavior is normal. Her mood appears not anxious.  Nursing note and vitals reviewed.    ED Treatments / Results  Labs (all labs ordered are listed, but only abnormal results are displayed) Results for orders placed or performed during the hospital encounter of 01/10/16  Comprehensive metabolic panel  Result Value Ref Range   Sodium 136 135 - 145 mmol/L   Potassium 4.8 3.5 - 5.1 mmol/L   Chloride 101 101 - 111 mmol/L   CO2 29 22 - 32 mmol/L   Glucose, Bld 149 (H) 65 - 99 mg/dL   BUN 44 (H) 6 - 20 mg/dL   Creatinine, Ser 1.57 (H) 0.44 - 1.00 mg/dL   Calcium 9.5 8.9 - 10.3 mg/dL   Total Protein 7.9 6.5 - 8.1 g/dL   Albumin 4.1 3.5 - 5.0 g/dL   AST 35 15 - 41 U/L   ALT 26 14 - 54 U/L   Alkaline Phosphatase 75 38 - 126 U/L   Total Bilirubin 0.8 0.3 - 1.2 mg/dL   GFR calc non Af Amer 32 (L) >60 mL/min   GFR calc Af Amer 37 (L) >60 mL/min   Anion gap 6 5 - 15  Brain natriuretic peptide  Result Value Ref Range   B  Natriuretic Peptide 456.0 (H) 0.0 - 100.0 pg/mL  Troponin I  Result Value Ref Range   Troponin I 1.10 (HH) <0.03 ng/mL  CBC with Differential  Result Value Ref Range   WBC 5.8 4.0 - 10.5 K/uL   RBC 3.25 (L) 3.87 - 5.11 MIL/uL   Hemoglobin 10.7 (L) 12.0 - 15.0 g/dL   HCT 33.4 (L) 36.0 - 46.0 %   MCV 102.8 (H) 78.0 - 100.0 fL   MCH 32.9 26.0 - 34.0 pg   MCHC 32.0 30.0 - 36.0 g/dL   RDW 15.2 11.5 - 15.5 %   Platelets 159 150 - 400 K/uL   Neutrophils Relative % 74 %   Neutro Abs 4.3 1.7 - 7.7 K/uL   Lymphocytes Relative 17 %   Lymphs Abs 1.0 0.7 - 4.0 K/uL   Monocytes Relative 7 %   Monocytes Absolute 0.4 0.1 - 1.0 K/uL   Eosinophils Relative 2 %   Eosinophils Absolute 0.1 0.0 - 0.7 K/uL   Basophils Relative 0 %   Basophils Absolute 0.0 0.0 - 0.1 K/uL   Laboratory interpretation all normal except Chronically positive troponin which has been higher in the past, elevated BNP but improved from her highest in the 900s, stable renal insufficiency, stable anemia    EKG  EKG Interpretation  Date/Time:  Sunday  January 10 2016 05:07:40 EST Ventricular Rate:  82 PR Interval:    QRS Duration: 90 QT Interval:  387 QTC Calculation: 452 R Axis:   3 Text Interpretation:  Sinus rhythm Low voltage, extremity and precordial leads Poor R wave progression Right ventricular conduction delay No significant change since last tracing 01 Dec 2015 Confirmed by Rochelle Larue  MD-I, Suriyah Vergara ([id number]) on 01/10/2016 5:46:21 AM       Radiology Dg Chest 2 View  Result Date: 01/10/2016 CLINICAL DATA:  Left anterior lower chest pain and dyspnea EXAM: CHEST  2 VIEW COMPARISON:  01/08/2016 FINDINGS: Substantial improvement from 01/08/2016, with near complete clearance of central and basilar opacities. There are residual patchy opacities in the bases. Probable small right pleural effusion. IMPRESSION: Significantly improved, but with residual mild patchy basilar opacities and small right pleural effusion. Electronically  Signed   By: Andreas Newport M.D.   On: 01/10/2016 06:35    Procedures Procedures (including critical care time)  Medications Ordered in ED Medications  furosemide (LASIX) injection 60 mg (60 mg Intramuscular Not Given 01/10/16 0722)  nitroGLYCERIN (NITROGLYN) 2 % ointment 1 inch (1 inch Topical Given 01/10/16 0522)  furosemide (LASIX) injection 60 mg (60 mg Intravenous Given 01/10/16 0640)     Initial Impression / Assessment and Plan / ED Course  I have reviewed the triage vital signs and the nursing notes.  Pertinent labs & imaging results that were available during my care of the patient were reviewed by me and considered in my medical decision making (see chart for details).  Clinical Course    Patient had nitroglycerin paste placed on her chest. She was given Lasix 60 mg IV.  Reviewing patient's test results sounds like she is improved from when she was admitted at [facility] on November 3. I'm going to have nursing staff ambulate patient and check her pulse ox. I also reviewed Dr. Myles Gip note was consult it by [facility] when patient was admitted this weekend. He felt her elevated troponins were due to her underlying congestive heart failure  Patient was ambulated by nursing staff and her oxygen remained 100%. At this point I think patient will be able to be discharged home. She can follow-up with Dr. Jacinta Shoe, her cardiologist this week.   December 02, 2015 Procedures   Left Heart Cath and Coronary Angiography  Conclusion     Mid LAD lesion, 60 %stenosed.  Prox Cx to Mid Cx lesion, 99 %stenosed.  Mid RCA lesion, 0 %stenosed at site of prior stent  Dist RCA lesion, 30%stenosed at site of prior stent.  Prox RCA lesion, 40 %stenosed.  Ost Cx to Prox Cx lesion, 80 %stenosed.  LV end diastolic pressure is moderately elevated.   1. 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 Patent stents in the mid RCA and distal  RCA/PDA 3. Moderate mid LAD disease- unchanged from prior studies. 4. Elevated LVEDP  Plan: medical therapy with diuresis for CHF.       Final Clinical Impressions(s) / ED Diagnoses   Final diagnoses:  Shortness of breath    Plan discharge  Rolland Porter, MD, Barbette Or, MD 01/10/16 [phone]

## 2016-01-10 NOTE — ED Triage Notes (Signed)
Pt states she left [facility] around 1700 yesterday. Pt states she became SOB when she was getting ready for bed at 2200. Pt states she has a new cough that is productive with a medium amount of white sputum.

## 2016-01-10 NOTE — ED Notes (Signed)
Patient transported to X-ray 

## 2016-01-10 NOTE — Discharge Instructions (Signed)
Your tests tonight show an improvement from when you were admitted to the hospital at [facility] earlier this weekend. Call Dr Kristian Covey office tomorrow to get a follow up appointment with him. Watch what you eat and try to follow a low sodium diet. You can take an extra fluid pill if needed for shortness of breath daily for the next couple of days.

## 2016-01-13 NOTE — Progress Notes (Signed)
SUBJECTIVE: Patient of Dr Jacinta Shoe added on to schedule D/C from [facility] 01/09/16 with flash pulmonary edema.  Troponins with no real bump ECG ok Diuresed quickly and responded to Bipap and d/c with higher dose of hydralazine for BP control Dietary indiscretion has been an issue  Peak tropoonin .39 and CR rose to 2 level with diuresis   Known CAD  With SEMI 12/02/15  Coronary angiography demonstrated the following:  Left Heart Cath and Coronary Angiography 12/02/15  Mid LAD lesion, 60 %stenosed.  Prox Cx to Mid Cx lesion, 99 %stenosed.  Mid RCA lesion, 0 %stenosed at site of prior stent  Dist RCA lesion, 30%stenosed at site of prior stent.  Prox RCA lesion, 40 %stenosed.  Ost Cx to Prox Cx lesion, 80 %stenosed.  LV end diastolic pressure is moderately elevated.  1. 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 Patent stents in the mid RCA and distal RCA/PDA 3. Moderate mid LAD disease- unchanged from prior studies. 4. Elevated LVEDP  Echo Nov 04, 2015 LVEF [id number], grade 2 diastolic dysfunction.  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  Her ischemic symptoms are back pain and SOB, both of which she denies at present.   Says she is better with her diet.  Doing well since d/c Stopped her norvasc because she thinks it makes Her LE edema worse.  Wants referral to [facility] cardiac rehab   Review of Systems: As per "subjective", otherwise negative.  Allergies  Allergen Reactions  . Ace Inhibitors Cough    Pt can tolerate Tribenzor (and ARBs)  . Codeine Rash    Current Outpatient Prescriptions  Medication Sig Dispense Refill  . ACCU-CHEK AVIVA PLUS test strip USE TO CHECK BLOOD GLUCOSE 4 TIMES A DAY 150 each 3  . acetaminophen (TYLENOL) 500 MG tablet Take 2 tablets (1,000 mg total) by mouth  every 6 (six) hours as needed (pain). 30 tablet 0  . ALPRAZolam (XANAX) 0.5 MG tablet Take 0.5 mg by mouth at bedtime as needed for sleep.     Marland Kitchen aspirin EC 81 MG tablet Take 81 mg by mouth daily.    . benzonatate (TESSALON) 100 MG capsule Take 100 mg by mouth daily as needed for cough.    . calcitRIOL (ROCALTROL) 0.25 MCG capsule Take 0.25 mcg by mouth every Monday, Wednesday, and Friday.     . famotidine (PEPCID) 20 MG tablet Take 20 mg by mouth daily.    . ferrous sulfate 325 (65 FE) MG tablet Take 325 mg by mouth daily.    Marland Kitchen gabapentin (NEURONTIN) 100 MG capsule Take 100 mg by mouth daily as needed (pain).     . hydrALAZINE (APRESOLINE) 25 MG tablet Take 1.5 tablets (37.5 mg total) by mouth 3 (three) times daily. 135 tablet 12  . insulin aspart (NOVOLOG FLEXPEN) 100 UNIT/ML FlexPen Inject 10-16 Units into the skin 3 (three) times daily with meals. 15 mL 2  . insulin degludec (TRESIBA FLEXTOUCH) 100 UNIT/ML SOPN FlexTouch Pen Inject 0.3 mLs (30 Units total) into the skin at bedtime. 3 pen 2  . insulin lispro (HUMALOG KWIKPEN) 100 UNIT/ML KiwkPen Inject 0.1-0.16 mLs (10-16 Units total) into the skin 3 (three) times daily. (Patient taking differently: Inject 10-16 Units into the skin 3 (three) times daily before meals. Per sliding scale) 15 mL 11  . isosorbide mononitrate (IMDUR) 60  MG 24 hr tablet Take 1 tablet (60 mg total) by mouth daily. 30 tablet 0  . latanoprost (XALATAN) 0.005 % ophthalmic solution Place 1 drop into both eyes at bedtime.     . meclizine (ANTIVERT) 12.5 MG tablet Take 12.5 mg by mouth 2 (two) times daily.     . metoprolol succinate (TOPROL-XL) 50 MG 24 hr tablet Take 1 tablet (50 mg total) by mouth 2 (two) times daily. Take with or immediately following a meal. 60 tablet 5  . Multiple Vitamin (MULTIVITAMIN WITH MINERALS) TABS tablet Take 1 tablet by mouth daily.    . nitroGLYCERIN (NITROSTAT) 0.4 MG SL tablet Place 1 tablet (0.4 mg total) under the tongue every 5 (five)  minutes as needed for chest pain. 25 tablet 2  . potassium chloride SA (K-DUR,KLOR-CON) 20 MEQ tablet Take 2 tablets (40 mEq total) by mouth daily. 60 tablet 5  . ranolazine (RANEXA) 500 MG 12 hr tablet Take 1 tablet (500 mg total) by mouth 2 (two) times daily. 60 tablet 5  . rosuvastatin (CRESTOR) 40 MG tablet Take 1 tablet (40 mg total) by mouth at bedtime. 30 tablet 0  . ticagrelor (BRILINTA) 90 MG TABS tablet Take 1 tablet (90 mg total) by mouth 2 (two) times daily. 60 tablet 0  . torsemide (DEMADEX) 20 MG tablet Take 3 tabs (60mg ) by mouth every morning & 1 tab (20mg ) mid day, can take an extra 20mg  daily if her weight increases by >3 pounds. 140 tablet 12  . trimethoprim (TRIMPEX) 100 MG tablet Take 100 mg by mouth daily.      No current facility-administered medications for this visit.     Past Medical History:  Diagnosis Date  . Acute CHF (congestive heart failure) ([place]) 07/04/2015  . Acute renal failure superimposed on stage 4 chronic kidney disease ([place]) 07/04/2015  . Allergic rhinitis   . Anxiety   . Constipation   . Diabetes mellitus   . GERD (gastroesophageal reflux disease)   . Heart murmur, systolic   . History of hysterectomy   . Hyperlipidemia   . Hypertension   . Low back pain   . Obesity   . Osteoarthritis   . Overactive bladder     Past Surgical History:  Procedure Laterality Date  . ABDOMINAL HYSTERECTOMY    . BACK SURGERY     multiple  . CARDIAC CATHETERIZATION  04/04/2006   Est EF of 60%  . CARDIAC CATHETERIZATION N/A 07/04/2015   Procedure: Left Heart Cath and Coronary Angiography;  Surgeon: Troy Sine, MD;  Location: [place] [facility];  Service: Cardiovascular;  Laterality: N/A;  . CARDIAC CATHETERIZATION N/A 07/04/2015   Procedure: Coronary Stent Intervention;  Surgeon: Troy Sine, MD;  Location: [place] [facility];  Service: Cardiovascular;  Laterality: N/A;  . CARDIAC CATHETERIZATION N/A 10/13/2015   Procedure: Left Heart Cath and Coronary  Angiography;  Surgeon: Troy Sine, MD;  Location: [place] [facility];  Service: Cardiovascular;  Laterality: N/A;  . CARDIAC CATHETERIZATION N/A 12/02/2015   Procedure: Left Heart Cath and Coronary Angiography;  Surgeon: Wahid Holley M [place], MD;  Location: [place] [facility];  Service: Cardiovascular;  Laterality: N/A;  . CARPAL TUNNEL RELEASE    . CERVICAL BIOPSY     cervical lymph node biopsies  . COLONOSCOPY  May 2002   Dr. Irving Shows :Followup in 5 years, normal exam  . COLONOSCOPY  2008   Dr. Laural Golden: Very redundant colon with mild melanosis coli, splenic  flexure polyp biopsy with acute complaint of benign colon polyp. Recommended ten-year followup  . CORONARY STENT PLACEMENT  04/11/2006   2 -- Taxus stents to the circumflex   . PERIPHERAL VASCULAR CATHETERIZATION Right 07/09/2015   Procedure: Upper Extremity Angiography;  Surgeon: Conrad [place], MD;  Location: [place] [facility];  Service: Cardiovascular;  Laterality: Right;  . PERIPHERAL VASCULAR CATHETERIZATION Right 07/10/2015   Procedure: RIGHT SUBCLAVIAN ARTERY THROMBECTOMY;  Surgeon: Serafina Mitchell, MD;  Location: [facility] OR;  Service: Vascular;  Laterality: Right;  . tendonitis     bilateral elbow  . TRIGGER FINGER RELEASE      Social History   Social History  . Marital status: Married    Spouse name: N/A  . Number of children: 3  . Years of education: N/A   Occupational History  . Not on file.   Social History Main Topics  . Smoking status: Never Smoker  . Smokeless tobacco: Never Used  . Alcohol use No  . Drug use: No  . Sexual activity: Not on file   Other Topics Concern  . Not on file   Social History Narrative  . No narrative on file     Vitals:   01/14/16 1006  BP: (!) 142/62  Pulse: 87  SpO2: 99%  Weight: 89.8 kg (198 lb)  Height: 5\' 11"  (1.803 m)    PHYSICAL EXAM Affect appropriate Chronically ill black female  HEENT: normal Neck supple with no adenopathy JVP normal left bruits no  thyromegaly Lungs clear with no wheezing and good diaphragmatic motion Heart:  S1/S2 SEM  murmur, no rub, gallop or click PMI normal Abdomen: benighn, BS positve, no tenderness, no AAA no bruit.  No HSM or HJR Distal pulses intact with no bruits Plus one edema Neuro non-focal Skin warm and dry No muscular weakness Decreased pulses in right arm     ECG:  01/11/16 SR low voltage poor R wave progression     ASSESSMENT AND PLAN: 1. CAD s/p NSTEMI with PDA and RCA stenting and CTO of left circumflex with RCA in-stent restenosis s/p PCI 10/2015: Symptomatically stable. Continue dual antiplatelet therapy indefinitely with aspirin and Brilinta. Continue metoprolol, nitrates, Ranexa, and Crestor. Concern that she may have ischemic MR has had cath in August and September no reason to repeat Now.  Refer to [facility] cardiac rehab  2. Essential HTN: Controlled.norvasc d/c due to edema   3. Hyperlipidemia: Continue Crestor 40 mg.  4. Bilateral carotid artery stenosis: Mild. Will repeat in 2 years. Continue aspirin and statin.  5. 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  6. Chronic systolic heart failure: Euvolemic. Continue torsemide 60 mg/20 mg.  Dispo: fu 6-8 weeks SK    Jenkins Rouge

## 2016-01-14 ENCOUNTER — Ambulatory Visit (INDEPENDENT_AMBULATORY_CARE_PROVIDER_SITE_OTHER): Payer: BLUE CROSS/BLUE SHIELD | Admitting: Cardiovascular Disease

## 2016-01-14 ENCOUNTER — Encounter: Payer: Self-pay | Admitting: Cardiovascular Disease

## 2016-01-14 VITALS — BP 142/62 | HR 87 | Ht 71.0 in | Wt 198.0 lb

## 2016-01-14 DIAGNOSIS — I214 Non-ST elevation (NSTEMI) myocardial infarction: Secondary | ICD-10-CM

## 2016-01-14 DIAGNOSIS — J810 Acute pulmonary edema: Secondary | ICD-10-CM

## 2016-01-14 DIAGNOSIS — I748 Embolism and thrombosis of other arteries: Secondary | ICD-10-CM

## 2016-01-14 NOTE — Patient Instructions (Signed)
Medication Instructions:  Your physician recommends that you continue on your current medications as directed. Please refer to the Current Medication list given to you today.    Labwork: NONE  Testing/Procedures: NONE  Follow-Up: Your physician recommends that you schedule a follow-up appointment in: 6-8 WEEKS    Any Other Special Instructions Will Be Listed Below (If Applicable).  WE HAVE GIVEN YOU A REFERRAL FOR CARDIAC REHAB- YOU MAY TAKE IT TO [place]  If you need a refill on your cardiac medications before your next appointment, please call your pharmacy.

## 2016-01-20 ENCOUNTER — Encounter: Payer: Self-pay | Admitting: Surgery

## 2016-01-21 ENCOUNTER — Ambulatory Visit (HOSPITAL_COMMUNITY)
Admission: RE | Admit: 2016-01-21 | Discharge: 2016-01-21 | Disposition: A | Discharge status: Home / Self Care | Payer: BLUE CROSS/BLUE SHIELD | Source: Ambulatory Visit | Attending: Cardiovascular Disease | Admitting: Cardiovascular Disease

## 2016-01-21 ENCOUNTER — Other Ambulatory Visit: Payer: Self-pay | Admitting: *Deleted

## 2016-01-21 ENCOUNTER — Other Ambulatory Visit (HOSPITAL_COMMUNITY)
Admission: RE | Admit: 2016-01-21 | Discharge: 2016-01-21 | Disposition: A | Discharge status: Home / Self Care | Payer: BLUE CROSS/BLUE SHIELD | Source: Ambulatory Visit | Attending: Cardiovascular Disease | Admitting: Cardiovascular Disease

## 2016-01-21 ENCOUNTER — Ambulatory Visit (INDEPENDENT_AMBULATORY_CARE_PROVIDER_SITE_OTHER): Payer: BLUE CROSS/BLUE SHIELD | Admitting: *Deleted

## 2016-01-21 VITALS — BP 158/72 | HR 85

## 2016-01-21 DIAGNOSIS — R0602 Shortness of breath: Secondary | ICD-10-CM | POA: Diagnosis present

## 2016-01-21 DIAGNOSIS — R079 Chest pain, unspecified: Secondary | ICD-10-CM | POA: Diagnosis not present

## 2016-01-21 LAB — TROPONIN I: TROPONIN I: 0.06 ng/mL — AB (ref <0.03)

## 2016-01-21 LAB — BRAIN NATRIURETIC PEPTIDE: B Natriuretic Peptide: 574 pg/mL — ABNORMAL HIGH (ref 0.0–100.0)

## 2016-01-21 MED ORDER — METOLAZONE 2.5 MG PO TABS: 2.5000 mg | ORAL_TABLET | Freq: Every day | ORAL | 0 refills | Status: DC

## 2016-01-21 MED ORDER — HYDRALAZINE HCL 25 MG PO TABS: 50.0000 mg | ORAL_TABLET | Freq: Three times a day (TID) | ORAL | Status: DC

## 2016-01-21 NOTE — Progress Notes (Signed)
Patient walked into office this morning c/o SOB & chest pressure.  SOB & chest pressure just began this morning.  Was just recently in hospital [facility]) for flash pulmonary edema & seen by Dr. Johnsie Cancel in our [place] office on 01/14/2016.  No chest pain.  She also does c/o productive cough with white mucous.  Did see PMD last week & was treated for muscle spasm in neck & given med for cough.  No fever.  Also has runny nose - productive yellow.  No weight gain & no more edema in her legs than usual.  She also states that she does know that she gets anxious occasionally & her primary has given her some Xanax to use as needed.    Discussed above with Dr. Bronson Ing -  He ordered EKG in office.  He reviewed & was stable in comparison to previous one done on 11/6.  Also, recommended lab for BNP, Troponin, & Chest x-ray.  Orders were given & she will go to [facility] now to have this done.  We will call her with results.    Patient already has previously scheduled follow up for 12/29 with Dr. Bronson Ing in [place] office.

## 2016-01-21 NOTE — Patient Instructions (Addendum)
Medication Instructions:   Resume the Aspirin at 81mg  daily.  Continue the Hydralazine at 2 tabs three times per day.  Continue all other medications.    Labwork:  BNP, Troponin  Orders given to do now at [facility].   Testing/Procedures:  Chest x-ray - order given to do now at [facility].   Follow-Up: Office will contact with results.  Keep already scheduled follow up for December with Dr. Bronson Ing.  Any Other Special Instructions Will Be Listed Below (If Applicable).  If you need a refill on your cardiac medications before your next appointment, please call your pharmacy.

## 2016-01-21 NOTE — Progress Notes (Signed)
Discussed with G. Hill. Will proceed with plan.

## 2016-01-21 NOTE — Addendum Note (Signed)
Addended by: Laurine Blazer on: 01/21/2016 04:21 PM   Modules accepted: Orders

## 2016-01-27 ENCOUNTER — Other Ambulatory Visit (HOSPITAL_COMMUNITY): Payer: BLUE CROSS/BLUE SHIELD

## 2016-02-01 ENCOUNTER — Other Ambulatory Visit (HOSPITAL_COMMUNITY): Payer: BLUE CROSS/BLUE SHIELD

## 2016-02-01 ENCOUNTER — Ambulatory Visit (INDEPENDENT_AMBULATORY_CARE_PROVIDER_SITE_OTHER): Payer: BLUE CROSS/BLUE SHIELD | Admitting: Surgery

## 2016-02-01 ENCOUNTER — Encounter: Payer: Self-pay | Admitting: Surgery

## 2016-02-01 ENCOUNTER — Ambulatory Visit (HOSPITAL_COMMUNITY)
Admission: RE | Admit: 2016-02-01 | Discharge: 2016-02-01 | Disposition: A | Discharge status: Home / Self Care | Payer: BLUE CROSS/BLUE SHIELD | Source: Ambulatory Visit | Attending: Surgery | Admitting: Surgery

## 2016-02-01 VITALS — BP 160/73 | HR 63 | Temp 98.6°F | Resp 18 | Ht 71.0 in | Wt 196.9 lb

## 2016-02-01 DIAGNOSIS — N186 End stage renal disease: Secondary | ICD-10-CM

## 2016-02-01 DIAGNOSIS — I748 Embolism and thrombosis of other arteries: Secondary | ICD-10-CM | POA: Diagnosis not present

## 2016-02-01 DIAGNOSIS — Z992 Dependence on renal dialysis: Secondary | ICD-10-CM | POA: Diagnosis not present

## 2016-02-01 NOTE — Addendum Note (Signed)
Addended by: Lianne Cure A on: 02/01/2016 03:20 PM   Modules accepted: Orders

## 2016-02-01 NOTE — Progress Notes (Signed)
Vascular and Vein Specialist of [facility]  Patient name: [name] MRN: [id number] DOB: October 29, 1945 Sex: female  REASON FOR VISIT: follow up  HPI: [name] is a 70 y.o. female who returns for follow-up.  She initially presented to the hospital in May 2017 with a non-STEMI.  At that time, a radial pulse on the right could not be palpated and therefore stenting was done through a femoral approach.  Dr. early was consulted.  The patient had good grip strength however she had some numbness in her hand and no radial pulse.  She underwent angiography which revealed a subclavian artery occlusion.  I took her to the operating room 1 07/10/2015 in through a right brachial exposure in the axilla, I performed thrombectomy of the right subclavian artery, evacuating acute thrombus.  The patient's numbness improved and she developed a palpable pulse.  She was ultimately discharged to home.  At her first postoperative visit, she was complaining of continued numbness in her right hand that was affecting her ability to wright.  She continued to have good grip strength.  I sent her to physical therapy.  She states that she was given some exercises but has not had significant change in the sensation in ability to use her right hand.  Past Medical History:  Diagnosis Date  . Acute CHF (congestive heart failure) ([place]) 07/04/2015  . Acute renal failure superimposed on stage 4 chronic kidney disease ([place]) 07/04/2015  . Allergic rhinitis   . Anxiety   . Constipation   . Diabetes mellitus   . GERD (gastroesophageal reflux disease)   . Heart murmur, systolic   . History of hysterectomy   . Hyperlipidemia   . Hypertension   . Low back pain   . Obesity   . Osteoarthritis   . Overactive bladder     Family History  Problem Relation Age of Onset  . Diabetes Father   . Hypertension Father   . Stroke Father   . Hypertension Brother   . Aneurysm Brother   .  Diabetes Brother   . Colon cancer Neg Hx     SOCIAL HISTORY: Social History  Substance Use Topics  . Smoking status: Never Smoker  . Smokeless tobacco: Never Used  . Alcohol use No    Allergies  Allergen Reactions  . Ace Inhibitors Cough    Pt can tolerate Tribenzor (and ARBs)  . Codeine Rash    Current Outpatient Prescriptions  Medication Sig Dispense Refill  . ACCU-CHEK AVIVA PLUS test strip USE TO CHECK BLOOD GLUCOSE 4 TIMES A DAY 150 each 3  . acetaminophen (TYLENOL) 500 MG tablet Take 2 tablets (1,000 mg total) by mouth every 6 (six) hours as needed (pain). 30 tablet 0  . ALPRAZolam (XANAX) 0.5 MG tablet Take 0.5 mg by mouth as needed for anxiety or sleep.     Marland Kitchen aspirin EC 81 MG tablet Take 81 mg by mouth daily.    . benzonatate (TESSALON) 100 MG capsule Take 100 mg by mouth daily as needed for cough.    . calcitRIOL (ROCALTROL) 0.25 MCG capsule Take 0.25 mcg by mouth every Monday, Wednesday, and Friday.     . cyclobenzaprine (FLEXERIL) 5 MG tablet Take 1 tablet by mouth 2 (two) times daily.    . famotidine (PEPCID) 20 MG tablet Take 20 mg by mouth daily.    . ferrous sulfate 325 (65 FE) MG tablet Take 325 mg by mouth daily.    Marland Kitchen gabapentin (NEURONTIN)  100 MG capsule Take 100 mg by mouth daily as needed (pain).     . hydrALAZINE (APRESOLINE) 25 MG tablet Take 2 tablets (50 mg total) by mouth 3 (three) times daily.    Marland Kitchen HYDROcodone-homatropine (HYCODAN) 5-1.5 MG/5ML syrup as needed.    . insulin aspart (NOVOLOG FLEXPEN) 100 UNIT/ML FlexPen Inject 10-16 Units into the skin 3 (three) times daily with meals. 15 mL 2  . insulin degludec (TRESIBA FLEXTOUCH) 100 UNIT/ML SOPN FlexTouch Pen Inject 0.3 mLs (30 Units total) into the skin at bedtime. 3 pen 2  . insulin lispro (HUMALOG KWIKPEN) 100 UNIT/ML KiwkPen Inject 0.1-0.16 mLs (10-16 Units total) into the skin 3 (three) times daily. (Patient taking differently: Inject 10-16 Units into the skin 3 (three) times daily before meals. Per  sliding scale) 15 mL 11  . isosorbide mononitrate (IMDUR) 60 MG 24 hr tablet Take 1 tablet (60 mg total) by mouth daily. 30 tablet 0  . latanoprost (XALATAN) 0.005 % ophthalmic solution Place 1 drop into both eyes at bedtime.     . meclizine (ANTIVERT) 12.5 MG tablet Take 12.5 mg by mouth 2 (two) times daily.     . metoprolol succinate (TOPROL-XL) 50 MG 24 hr tablet Take 1 tablet (50 mg total) by mouth 2 (two) times daily. Take with or immediately following a meal. 60 tablet 5  . Multiple Vitamin (MULTIVITAMIN WITH MINERALS) TABS tablet Take 1 tablet by mouth daily.    . nitroGLYCERIN (NITROSTAT) 0.4 MG SL tablet Place 1 tablet (0.4 mg total) under the tongue every 5 (five) minutes as needed for chest pain. 25 tablet 2  . potassium chloride SA (K-DUR,KLOR-CON) 20 MEQ tablet Take 2 tablets (40 mEq total) by mouth daily. 60 tablet 5  . ranolazine (RANEXA) 500 MG 12 hr tablet Take 1 tablet (500 mg total) by mouth 2 (two) times daily. 60 tablet 5  . rosuvastatin (CRESTOR) 40 MG tablet Take 1 tablet (40 mg total) by mouth at bedtime. 30 tablet 0  . ticagrelor (BRILINTA) 90 MG TABS tablet Take 1 tablet (90 mg total) by mouth 2 (two) times daily. 60 tablet 0  . torsemide (DEMADEX) 20 MG tablet Take 3 tabs (60mg ) by mouth every morning & 1 tab (20mg ) mid day, can take an extra 20mg  daily if her weight increases by >3 pounds. 140 tablet 12  . trimethoprim (TRIMPEX) 100 MG tablet Take 100 mg by mouth daily.     . metolazone (ZAROXOLYN) 2.5 MG tablet Take 1 tablet (2.5 mg total) by mouth daily. 2 tablet 0   No current facility-administered medications for this visit.     REVIEW OF SYSTEMS:  [X]  denotes positive finding, [ ]  denotes negative finding Cardiac  Comments:  Chest pain or chest pressure:    Shortness of breath upon exertion:    Short of breath when lying flat:    Irregular heart rhythm:        Vascular    Pain in calf, thigh, or hip brought on by ambulation:    Pain in feet at night that  wakes you up from your sleep:     Blood clot in your veins:    Leg swelling:         Pulmonary    Oxygen at home:    Productive cough:     Wheezing:         Neurologic    Sudden weakness in arms or legs:  x   Sudden numbness in arms or legs:  x   Sudden onset of difficulty speaking or slurred speech:    Temporary loss of vision in one eye:     Problems with dizziness:         Gastrointestinal    Blood in stool:     Vomited blood:         Genitourinary    Burning when urinating:     Blood in urine:        Psychiatric    Major depression:         Hematologic    Bleeding problems:    Problems with blood clotting too easily:        Skin    Rashes or ulcers:        Constitutional    Fever or chills:      PHYSICAL EXAM: Vitals:   02/01/16 1148 02/01/16 1150  BP: (!) 158/72 (!) 160/73  Pulse: 63   Resp: 18   Temp: 98.6 F (37 C)   TempSrc: Oral   SpO2: 100%   Weight: 196 lb 14.4 oz (89.3 kg)   Height: 5\' 11"  (1.803 m)     GENERAL: The patient is a well-nourished female, in no acute distress. The vital signs are documented above. CARDIAC: There is a regular rate and rhythm.  VASCULAR: Palpable right brachial and radial pulse PULMONARY: There is good air exchange bilaterally without wheezing or rales. MUSCULOSKELETAL: There are no major deformities or cyanosis. NEUROLOGIC: Good grip strength to the right hand. SKIN: There are no ulcers or rashes noted. PSYCHIATRIC: The patient has a normal affect.  DATA:  I have ordered and reviewed her duplex.  This shows a patent right brachial artery with no evidence of stenosis and triphasic waveforms  MEDICAL ISSUES: The patient has adequate perfusion to her right hand, however she still has persistent sensory loss.  She is been evaluated by physical therapy who felt that she is having some muscle wasting contributing to her symptoms.  I discussed with her that she has adequate blood flow to her hand.  Given the duration  between now and her surgery, I feel that she most likely will not regain any improvements in her hand.  She would like to be evaluated for a compression glove to see if this will help give her some extra support.  Therefore I will send her down to [facility].  She will follow up again in 6 months with repeat ultrasound.    Annamarie Major, MD [facility] [phone] Pager [phone]

## 2016-02-05 ENCOUNTER — Ambulatory Visit (INDEPENDENT_AMBULATORY_CARE_PROVIDER_SITE_OTHER): Payer: BLUE CROSS/BLUE SHIELD | Admitting: Cardiovascular Disease

## 2016-02-05 ENCOUNTER — Encounter: Payer: Self-pay | Admitting: Cardiovascular Disease

## 2016-02-05 VITALS — BP 136/60 | HR 77 | Ht 71.0 in | Wt 197.0 lb

## 2016-02-05 DIAGNOSIS — I748 Embolism and thrombosis of other arteries: Secondary | ICD-10-CM

## 2016-02-05 DIAGNOSIS — I779 Disorder of arteries and arterioles, unspecified: Secondary | ICD-10-CM

## 2016-02-05 DIAGNOSIS — E7800 Pure hypercholesterolemia, unspecified: Secondary | ICD-10-CM

## 2016-02-05 DIAGNOSIS — I209 Angina pectoris, unspecified: Secondary | ICD-10-CM

## 2016-02-05 DIAGNOSIS — I25118 Atherosclerotic heart disease of native coronary artery with other forms of angina pectoris: Secondary | ICD-10-CM | POA: Diagnosis not present

## 2016-02-05 DIAGNOSIS — I5042 Chronic combined systolic (congestive) and diastolic (congestive) heart failure: Secondary | ICD-10-CM

## 2016-02-05 DIAGNOSIS — I10 Essential (primary) hypertension: Secondary | ICD-10-CM

## 2016-02-05 DIAGNOSIS — I739 Peripheral vascular disease, unspecified: Secondary | ICD-10-CM

## 2016-02-05 NOTE — Progress Notes (Signed)
SUBJECTIVE: The patient presents for follow up of CAD and CHF.  Coronary angiography demonstrated the following:  Left Heart Cath and Coronary Angiography 12/02/15  Mid LAD lesion, 60 %stenosed.  Prox Cx to Mid Cx lesion, 99 %stenosed.  Mid RCA lesion, 0 %stenosed at site of prior stent  Dist RCA lesion, 30%stenosed at site of prior stent.  Prox RCA lesion, 40 %stenosed.  Ost Cx to Prox Cx lesion, 80 %stenosed.  LV end diastolic pressure is moderately elevated.  1. 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 Patent stents in the mid RCA and distal RCA/PDA 3. Moderate mid LAD disease- unchanged from prior studies. 4. Elevated LVEDP  Echo 10-30-2015 LVEF [id number], grade 2 diastolic dysfunction.  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  Her ischemic symptoms are back pain and SOB, both of which she denies at present.   She has been eating low sodium foods. 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   Review of Systems: As per "subjective", otherwise negative.  Allergies  Allergen Reactions  . Ace Inhibitors Cough    Pt can tolerate Tribenzor (and ARBs)  . Codeine Rash    Current Outpatient Prescriptions  Medication Sig Dispense Refill  . ACCU-CHEK AVIVA PLUS test strip USE TO CHECK BLOOD GLUCOSE 4 TIMES A DAY 150 each 3  . acetaminophen (TYLENOL) 500 MG tablet Take 2 tablets (1,000 mg total) by mouth every 6 (six) hours as needed (pain). 30 tablet 0  . ALPRAZolam (XANAX) 0.5 MG tablet Take 0.5 mg by mouth as needed for anxiety or sleep.     Marland Kitchen aspirin EC 81 MG tablet Take 81 mg by mouth daily.    . benzonatate (TESSALON) 100 MG capsule Take 100 mg by mouth daily as needed for cough.    . calcitRIOL (ROCALTROL) 0.25 MCG capsule Take 0.25 mcg by mouth every Monday,  Wednesday, and Friday.     . cyclobenzaprine (FLEXERIL) 5 MG tablet Take 1 tablet by mouth 2 (two) times daily.    . famotidine (PEPCID) 20 MG tablet Take 20 mg by mouth daily.    . ferrous sulfate 325 (65 FE) MG tablet Take 325 mg by mouth daily.    Marland Kitchen gabapentin (NEURONTIN) 100 MG capsule Take 100 mg by mouth daily as needed (pain).     . hydrALAZINE (APRESOLINE) 25 MG tablet Take 2 tablets (50 mg total) by mouth 3 (three) times daily.    Marland Kitchen HYDROcodone-homatropine (HYCODAN) 5-1.5 MG/5ML syrup as needed.    . insulin aspart (NOVOLOG FLEXPEN) 100 UNIT/ML FlexPen Inject 10-16 Units into the skin 3 (three) times daily with meals. 15 mL 2  . insulin degludec (TRESIBA FLEXTOUCH) 100 UNIT/ML SOPN FlexTouch Pen Inject 0.3 mLs (30 Units total) into the skin at bedtime. 3 pen 2  . insulin lispro (HUMALOG KWIKPEN) 100 UNIT/ML KiwkPen Inject 0.1-0.16 mLs (10-16 Units total) into the skin 3 (three) times daily. (Patient taking differently: Inject 10-16 Units into the skin 3 (three) times daily before meals. Per sliding scale) 15 mL 11  . isosorbide mononitrate (IMDUR) 60 MG 24 hr tablet Take 1 tablet (60 mg total) by mouth daily. 30 tablet 0  . latanoprost (XALATAN) 0.005 % ophthalmic solution Place 1 drop into both eyes at bedtime.     . meclizine (ANTIVERT) 12.5 MG  tablet Take 12.5 mg by mouth 2 (two) times daily.     . metoprolol succinate (TOPROL-XL) 50 MG 24 hr tablet Take 1 tablet (50 mg total) by mouth 2 (two) times daily. Take with or immediately following a meal. 60 tablet 5  . Multiple Vitamin (MULTIVITAMIN WITH MINERALS) TABS tablet Take 1 tablet by mouth daily.    . nitroGLYCERIN (NITROSTAT) 0.4 MG SL tablet Place 1 tablet (0.4 mg total) under the tongue every 5 (five) minutes as needed for chest pain. 25 tablet 2  . potassium chloride SA (K-DUR,KLOR-CON) 20 MEQ tablet Take 2 tablets (40 mEq total) by mouth daily. 60 tablet 5  . ranolazine (RANEXA) 500 MG 12 hr tablet Take 1 tablet (500 mg total) by  mouth 2 (two) times daily. 60 tablet 5  . rosuvastatin (CRESTOR) 40 MG tablet Take 1 tablet (40 mg total) by mouth at bedtime. 30 tablet 0  . ticagrelor (BRILINTA) 90 MG TABS tablet Take 1 tablet (90 mg total) by mouth 2 (two) times daily. 60 tablet 0  . torsemide (DEMADEX) 20 MG tablet Take 3 tabs (60mg ) by mouth every morning & 1 tab (20mg ) mid day, can take an extra 20mg  daily if her weight increases by >3 pounds. 140 tablet 12  . trimethoprim (TRIMPEX) 100 MG tablet Take 100 mg by mouth daily.     . metolazone (ZAROXOLYN) 2.5 MG tablet Take 1 tablet (2.5 mg total) by mouth daily. 2 tablet 0   No current facility-administered medications for this visit.     Past Medical History:  Diagnosis Date  . Acute CHF (congestive heart failure) ([place]) 07/04/2015  . Acute renal failure superimposed on stage 4 chronic kidney disease ([place]) 07/04/2015  . Allergic rhinitis   . Anxiety   . Constipation   . Diabetes mellitus   . GERD (gastroesophageal reflux disease)   . Heart murmur, systolic   . History of hysterectomy   . Hyperlipidemia   . Hypertension   . Low back pain   . Obesity   . Osteoarthritis   . Overactive bladder     Past Surgical History:  Procedure Laterality Date  . ABDOMINAL HYSTERECTOMY    . BACK SURGERY     multiple  . CARDIAC CATHETERIZATION  04/04/2006   Est EF of 60%  . CARDIAC CATHETERIZATION N/A 07/04/2015   Procedure: Left Heart Cath and Coronary Angiography;  Surgeon: Troy Sine, MD;  Location: [facility];  Service: Cardiovascular;  Laterality: N/A;  . CARDIAC CATHETERIZATION N/A 07/04/2015   Procedure: Coronary Stent Intervention;  Surgeon: Troy Sine, MD;  Location: [facility];  Service: Cardiovascular;  Laterality: N/A;  . CARDIAC CATHETERIZATION N/A 10/13/2015   Procedure: Left Heart Cath and Coronary Angiography;  Surgeon: Troy Sine, MD;  Location: [facility];  Service: Cardiovascular;  Laterality: N/A;  . CARDIAC  CATHETERIZATION N/A 12/02/2015   Procedure: Left Heart Cath and Coronary Angiography;  Surgeon: Peter M Martinique, MD;  Location: [facility];  Service: Cardiovascular;  Laterality: N/A;  . CARPAL TUNNEL RELEASE    . CERVICAL BIOPSY     cervical lymph node biopsies  . COLONOSCOPY  May 2002   Dr. Irving Shows :Followup in 5 years, normal exam  . COLONOSCOPY  2008   Dr. Laural Golden: Very redundant colon with mild melanosis coli, splenic flexure polyp biopsy with acute complaint of benign colon polyp. Recommended ten-year followup  . CORONARY STENT PLACEMENT  04/11/2006  2 -- Taxus stents to the circumflex   . PERIPHERAL VASCULAR CATHETERIZATION Right 07/09/2015   Procedure: Upper Extremity Angiography;  Surgeon: Conrad The Crossings, MD;  Location: [facility];  Service: Cardiovascular;  Laterality: Right;  . PERIPHERAL VASCULAR CATHETERIZATION Right 07/10/2015   Procedure: RIGHT SUBCLAVIAN ARTERY THROMBECTOMY;  Surgeon: Serafina Mitchell, MD;  Location: [facility];  Service: Vascular;  Laterality: Right;  . tendonitis     bilateral elbow  . TRIGGER FINGER RELEASE      Social History   Social History  . Marital status: Married    Spouse name: N/A  . Number of children: 3  . Years of education: N/A   Occupational History  . Not on file.   Social History Main Topics  . Smoking status: Never Smoker  . Smokeless tobacco: Never Used  . Alcohol use No  . Drug use: No  . Sexual activity: Not on file   Other Topics Concern  . Not on file   Social History Narrative  . No narrative on file     Vitals:   02/05/16 1110  BP: 136/60  Pulse: 77  SpO2: 98%  Weight: 197 lb (89.4 kg)  Height: 5\' 11"  (1.803 m)    PHYSICAL EXAM General: NAD HEENT: Normal. Neck: No JVD, no thyromegaly. Lungs: Clear to auscultation bilaterally with normal respiratory effort. CV: Nondisplaced PMI.  Regular rate and rhythm, normal S1/S2, no S3/S4, no murmur. No pretibial or periankle edema.  No carotid bruit.     Abdomen: Soft, nontender, no distention.  Neurologic: Alert and oriented.  Psych: Normal affect. Skin: Normal. Musculoskeletal: No gross deformities.    ECG: Most recent ECG reviewed.      ASSESSMENT AND PLAN: 1. CAD s/p NSTEMI with PDA and RCA stenting and CTO of left circumflex with RCA in-stent restenosis s/p PCI 10/2015: Symptomatically stable. Continue dual antiplatelet therapy indefinitely with aspirin and Brilinta. Continue metoprolol, nitrates, Ranexa, and Crestor.   2. Essential HTN: Controlled. No changes. Amlodipine caused edema in the past.  3. Hyperlipidemia: Continue Crestor 40 mg.  4. Bilateral carotid artery stenosis: Mild. Will repeat in 2 years. Continue aspirin and statin.  5. 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  6. Chronic systolic heart failure: Euvolemic. Continue torsemide 60 mg/20 mg.  Dispo: fu 3 months.   Kate Sable, M.D., F.A.C.C.

## 2016-02-05 NOTE — Patient Instructions (Signed)
Your physician recommends that you schedule a follow-up appointment in:  3 months Dr Bronson Ing   Your physician recommends that you continue on your current medications as directed. Please refer to the Current Medication list given to you today.    Thank you for choosing [facility] !

## 2016-02-14 ENCOUNTER — Other Ambulatory Visit: Payer: Self-pay | Admitting: "Endocrinology

## 2016-02-18 ENCOUNTER — Other Ambulatory Visit: Payer: Self-pay | Admitting: "Endocrinology

## 2016-02-18 DIAGNOSIS — E118 Type 2 diabetes mellitus with unspecified complications: Principal | ICD-10-CM

## 2016-02-18 DIAGNOSIS — E1165 Type 2 diabetes mellitus with hyperglycemia: Secondary | ICD-10-CM

## 2016-02-19 LAB — COMPREHENSIVE METABOLIC PANEL
ALBUMIN: 4.7 g/dL (ref 3.5–4.8)
ALT: 19 IU/L (ref 0–32)
AST: 21 IU/L (ref 0–40)
Albumin/Globulin Ratio: 1.7 (ref 1.2–2.2)
Alkaline Phosphatase: 84 IU/L (ref 39–117)
BUN / CREAT RATIO: 19 (ref 12–28)
BUN: 53 mg/dL — AB (ref 8–27)
Bilirubin Total: 0.4 mg/dL (ref 0.0–1.2)
CHLORIDE: 96 mmol/L (ref 96–106)
CO2: 28 mmol/L (ref 18–29)
CREATININE: 2.77 mg/dL — AB (ref 0.57–1.00)
Calcium: 10.2 mg/dL (ref 8.7–10.3)
GFR calc non Af Amer: 17 mL/min/{1.73_m2} — ABNORMAL LOW (ref >59)
GFR, EST AFRICAN AMERICAN: 19 mL/min/{1.73_m2} — AB (ref >59)
Globulin, Total: 2.8 g/dL (ref 1.5–4.5)
Glucose: 153 mg/dL — ABNORMAL HIGH (ref 65–99)
Potassium: 4.8 mmol/L (ref 3.5–5.2)
Sodium: 141 mmol/L (ref 134–144)
TOTAL PROTEIN: 7.5 g/dL (ref 6.0–8.5)

## 2016-02-19 LAB — HEMOGLOBIN A1C
ESTIMATED AVERAGE GLUCOSE: 232 mg/dL
HEMOGLOBIN A1C: 9.7 % — AB (ref 4.8–5.6)

## 2016-02-23 ENCOUNTER — Encounter: Payer: Self-pay | Admitting: "Endocrinology

## 2016-02-23 ENCOUNTER — Ambulatory Visit (INDEPENDENT_AMBULATORY_CARE_PROVIDER_SITE_OTHER): Payer: Medicare Other | Admitting: "Endocrinology

## 2016-02-23 VITALS — BP 135/78 | HR 60 | Ht 71.0 in | Wt 197.0 lb

## 2016-02-23 DIAGNOSIS — E1122 Type 2 diabetes mellitus with diabetic chronic kidney disease: Secondary | ICD-10-CM | POA: Diagnosis not present

## 2016-02-23 DIAGNOSIS — E782 Mixed hyperlipidemia: Secondary | ICD-10-CM | POA: Diagnosis not present

## 2016-02-23 DIAGNOSIS — N184 Chronic kidney disease, stage 4 (severe): Secondary | ICD-10-CM

## 2016-02-23 DIAGNOSIS — I10 Essential (primary) hypertension: Secondary | ICD-10-CM

## 2016-02-23 DIAGNOSIS — I748 Embolism and thrombosis of other arteries: Secondary | ICD-10-CM | POA: Diagnosis not present

## 2016-02-23 DIAGNOSIS — Z794 Long term (current) use of insulin: Secondary | ICD-10-CM

## 2016-02-23 MED ORDER — INSULIN LISPRO 100 UNIT/ML (KWIKPEN): 10.0000 [IU] | PEN_INJECTOR | Freq: Three times a day (TID) | SUBCUTANEOUS | 2 refills | Status: DC

## 2016-02-23 NOTE — Patient Instructions (Signed)
Advice for weight management -For most of us the best way to lose weight is by diet management. Generally speaking, diet management means restricting carbohydrate consumption to minimum possible (and to unprocessed or minimally processed complex starch) and increasing protein intake (animal or plant source), fruits, and vegetables.  -Sticking to a routine mealtime to eat 3 meals a day and avoiding unnecessary snacks is shown to have a big role in weight control.  -It is better to avoid simple carbohydrates including: Cakes, Desserts, Ice Cream, Soda (diet and regular), Sweet Tea, Candies, Chips, Cookies, Artificial Sweeteners, and "Sugar-free" Products.   -Exercise: 30 minutes a day 3-4 days a week, or 150 minutes a week. Combine stretch, strength, and aerobic activities. You may seek evaluation by your heart doctor prior to initiating exercise if you have high risk for heart disease.  -If you are interested, we can schedule a visit with [name], RDN, CDE for individualized nutrition education.  

## 2016-02-23 NOTE — Progress Notes (Signed)
Subjective:    Patient ID: [name], female    DOB: 02/15/1946,    Past Medical History:  Diagnosis Date  . Acute CHF (congestive heart failure) ([place]) 07/04/2015  . Acute renal failure superimposed on stage 4 chronic kidney disease ([place]) 07/04/2015  . Allergic rhinitis   . Anxiety   . Constipation   . Diabetes mellitus   . GERD (gastroesophageal reflux disease)   . Heart murmur, systolic   . History of hysterectomy   . Hyperlipidemia   . Hypertension   . Low back pain   . Obesity   . Osteoarthritis   . Overactive bladder    Past Surgical History:  Procedure Laterality Date  . ABDOMINAL HYSTERECTOMY    . BACK SURGERY     multiple  . CARDIAC CATHETERIZATION  04/04/2006   Est EF of 60%  . CARDIAC CATHETERIZATION N/A 07/04/2015   Procedure: Left Heart Cath and Coronary Angiography;  Surgeon: Troy Sine, MD;  Location: [facility];  Service: Cardiovascular;  Laterality: N/A;  . CARDIAC CATHETERIZATION N/A 07/04/2015   Procedure: Coronary Stent Intervention;  Surgeon: Troy Sine, MD;  Location: [facility];  Service: Cardiovascular;  Laterality: N/A;  . CARDIAC CATHETERIZATION N/A 10/13/2015   Procedure: Left Heart Cath and Coronary Angiography;  Surgeon: Troy Sine, MD;  Location: [facility];  Service: Cardiovascular;  Laterality: N/A;  . CARDIAC CATHETERIZATION N/A 12/02/2015   Procedure: Left Heart Cath and Coronary Angiography;  Surgeon: Peter M Martinique, MD;  Location: [facility];  Service: Cardiovascular;  Laterality: N/A;  . CARPAL TUNNEL RELEASE    . CERVICAL BIOPSY     cervical lymph node biopsies  . COLONOSCOPY  May 2002   Dr. Irving Shows :Followup in 5 years, normal exam  . COLONOSCOPY  2008   Dr. Laural Golden: Very redundant colon with mild melanosis coli, splenic flexure polyp biopsy with acute complaint of benign colon polyp. Recommended ten-year followup  . CORONARY STENT PLACEMENT  04/11/2006   2 -- Taxus stents to the  circumflex   . PERIPHERAL VASCULAR CATHETERIZATION Right 07/09/2015   Procedure: Upper Extremity Angiography;  Surgeon: Conrad Lebanon, MD;  Location: [facility];  Service: Cardiovascular;  Laterality: Right;  . PERIPHERAL VASCULAR CATHETERIZATION Right 07/10/2015   Procedure: RIGHT SUBCLAVIAN ARTERY THROMBECTOMY;  Surgeon: Serafina Mitchell, MD;  Location: [facility];  Service: Vascular;  Laterality: Right;  . tendonitis     bilateral elbow  . TRIGGER FINGER RELEASE     Social History   Social History  . Marital status: Married    Spouse name: N/A  . Number of children: 3  . Years of education: N/A   Social History Main Topics  . Smoking status: Never Smoker  . Smokeless tobacco: Never Used  . Alcohol use No  . Drug use: No  . Sexual activity: Not on file   Other Topics Concern  . Not on file   Social History Narrative  . No narrative on file   Outpatient Encounter Prescriptions as of 02/23/2016  Medication Sig  . ACCU-CHEK AVIVA PLUS test strip USE TO CHECK BLOOD GLUCOSE 4 TIMES A DAY  . acetaminophen (TYLENOL) 500 MG tablet Take 2 tablets (1,000 mg total) by mouth every 6 (six) hours as needed (pain).  Marland Kitchen ALPRAZolam (XANAX) 0.5 MG tablet Take 0.5 mg by mouth as needed for anxiety or sleep.   Marland Kitchen aspirin EC 81 MG  tablet Take 81 mg by mouth daily.  . benzonatate (TESSALON) 100 MG capsule Take 100 mg by mouth daily as needed for cough.  . calcitRIOL (ROCALTROL) 0.25 MCG capsule Take 0.25 mcg by mouth every Monday, Wednesday, and Friday.   . cyclobenzaprine (FLEXERIL) 5 MG tablet Take 1 tablet by mouth 2 (two) times daily.  . famotidine (PEPCID) 20 MG tablet Take 20 mg by mouth daily.  . ferrous sulfate 325 (65 FE) MG tablet Take 325 mg by mouth daily.  Marland Kitchen gabapentin (NEURONTIN) 100 MG capsule Take 100 mg by mouth daily as needed (pain).   . hydrALAZINE (APRESOLINE) 25 MG tablet Take 2 tablets (50 mg total) by mouth 3 (three) times daily.  Marland Kitchen HYDROcodone-homatropine (HYCODAN) 5-1.5  MG/5ML syrup as needed.  . insulin degludec (TRESIBA FLEXTOUCH) 100 UNIT/ML SOPN FlexTouch Pen Inject 0.3 mLs (30 Units total) into the skin at bedtime.  . insulin lispro (HUMALOG KWIKPEN) 100 UNIT/ML KiwkPen Inject 0.1-0.16 mLs (10-16 Units total) into the skin 3 (three) times daily.  . isosorbide mononitrate (IMDUR) 60 MG 24 hr tablet Take 1 tablet (60 mg total) by mouth daily.  Marland Kitchen latanoprost (XALATAN) 0.005 % ophthalmic solution Place 1 drop into both eyes at bedtime.   . meclizine (ANTIVERT) 12.5 MG tablet Take 12.5 mg by mouth 2 (two) times daily.   . metolazone (ZAROXOLYN) 2.5 MG tablet Take 1 tablet (2.5 mg total) by mouth daily.  . metoprolol succinate (TOPROL-XL) 50 MG 24 hr tablet Take 1 tablet (50 mg total) by mouth 2 (two) times daily. Take with or immediately following a meal.  . Multiple Vitamin (MULTIVITAMIN WITH MINERALS) TABS tablet Take 1 tablet by mouth daily.  . nitroGLYCERIN (NITROSTAT) 0.4 MG SL tablet Place 1 tablet (0.4 mg total) under the tongue every 5 (five) minutes as needed for chest pain.  . potassium chloride SA (K-DUR,KLOR-CON) 20 MEQ tablet Take 2 tablets (40 mEq total) by mouth daily.  . ranolazine (RANEXA) 500 MG 12 hr tablet Take 1 tablet (500 mg total) by mouth 2 (two) times daily.  . rosuvastatin (CRESTOR) 40 MG tablet Take 1 tablet (40 mg total) by mouth at bedtime.  . ticagrelor (BRILINTA) 90 MG TABS tablet Take 1 tablet (90 mg total) by mouth 2 (two) times daily.  Marland Kitchen torsemide (DEMADEX) 20 MG tablet Take 3 tabs (60mg ) by mouth every morning & 1 tab (20mg ) mid day, can take an extra 20mg  daily if her weight increases by >3 pounds.  . trimethoprim (TRIMPEX) 100 MG tablet Take 100 mg by mouth daily.   . [DISCONTINUED] HUMALOG KWIKPEN 100 UNIT/ML KiwkPen INJECT 15 TO 21 UNITS TOTAL INTO THE SKIN THREE TIMES DAILY  . [DISCONTINUED] insulin aspart (NOVOLOG FLEXPEN) 100 UNIT/ML FlexPen Inject 10-16 Units into the skin 3 (three) times daily with meals.   No  facility-administered encounter medications on file as of 02/23/2016.    ALLERGIES: Allergies  Allergen Reactions  . Ace Inhibitors Cough    Pt can tolerate Tribenzor (and ARBs)  . Codeine Rash   VACCINATION STATUS:  There is no immunization history on file for this patient.  Diabetes  She presents for her follow-up diabetic visit. She has type 2 diabetes mellitus. Her disease course has been worsening. Pertinent negatives for hypoglycemia include no confusion, headaches, pallor or seizures. Associated symptoms include polydipsia and polyuria. Pertinent negatives for diabetes include no chest pain, no fatigue and no polyphagia. There are no hypoglycemic complications. Symptoms are worsening. Diabetic complications include heart disease, nephropathy  and retinopathy. Pertinent negatives for diabetic complications include no CVA. Risk factors for coronary artery disease include diabetes mellitus, dyslipidemia, sedentary lifestyle and hypertension. Current diabetic treatment includes insulin injections. She is compliant with treatment most of the time. Her weight is stable. She is following a generally unhealthy diet. Meal planning includes avoidance of concentrated sweets. She rarely participates in exercise. Her home blood glucose trend is increasing rapidly. Her breakfast blood glucose range is generally 180-200 mg/dl. Her lunch blood glucose range is generally 180-200 mg/dl. Her dinner blood glucose range is generally >200 mg/dl. Her overall blood glucose range is >200 mg/dl. An ACE inhibitor/angiotensin II receptor blocker is contraindicated (allergic to ACEI). Eye exam is current.  Hypertension  This is a chronic problem. The current episode started more than 1 year ago. The problem has been gradually improving since onset. Pertinent negatives include no chest pain, headaches, palpitations or shortness of breath. Hypertensive end-organ damage includes retinopathy. There is no history of CVA.   Hyperlipidemia  This is a chronic problem. The current episode started more than 1 year ago. Pertinent negatives include no chest pain, myalgias or shortness of breath. Current antihyperlipidemic treatment includes statins.  Coronary Artery Disease  Pertinent negatives include no chest pain, leg swelling, palpitations or shortness of breath. Risk factors include hyperlipidemia and hypertension.     Review of Systems  Constitutional: Negative for fatigue and unexpected weight change.  HENT: Negative for trouble swallowing and voice change.   Eyes: Negative for visual disturbance.  Respiratory: Negative for cough, shortness of breath and wheezing.   Cardiovascular: Negative for chest pain, palpitations and leg swelling.  Gastrointestinal: Negative for diarrhea, nausea and vomiting.  Endocrine: Positive for polydipsia and polyuria. Negative for cold intolerance, heat intolerance and polyphagia.  Musculoskeletal: Negative for arthralgias and myalgias.  Skin: Negative for color change, pallor, rash and wound.  Neurological: Negative for seizures and headaches.  Psychiatric/Behavioral: Negative for confusion and suicidal ideas.    Objective:    BP 135/78   Pulse 60   Ht 5\' 11"  (1.803 m)   Wt 197 lb (89.4 kg)   BMI 27.48 kg/m   Wt Readings from Last 3 Encounters:  02/23/16 197 lb (89.4 kg)  02/05/16 197 lb (89.4 kg)  02/01/16 196 lb 14.4 oz (89.3 kg)    Physical Exam  Constitutional: She is oriented to person, place, and time. She appears well-developed.  HENT:  Head: Normocephalic and atraumatic.  Eyes: EOM are normal.  Neck: Normal range of motion. Neck supple. No tracheal deviation present. No thyromegaly present.  Cardiovascular: Normal rate and regular rhythm.   Pulmonary/Chest: Effort normal and breath sounds normal.  Abdominal: Soft. Bowel sounds are normal. There is no tenderness. There is no guarding.  Musculoskeletal: Normal range of motion. She exhibits no edema.   Neurological: She is alert and oriented to person, place, and time. She has normal reflexes. No cranial nerve deficit. Coordination normal.  Skin: Skin is warm and dry. No rash noted. No erythema. No pallor.  Psychiatric: She has a normal mood and affect. Judgment normal.    Results for orders placed or performed in visit on 02/18/16  Hemoglobin A1c  Result Value Ref Range   Hgb A1c MFr Bld 9.7 (H) 4.8 - 5.6 %   Est. average glucose Bld gHb Est-mCnc 232 mg/dL  Comprehensive metabolic panel  Result Value Ref Range   Glucose 153 (H) 65 - 99 mg/dL   BUN 53 (H) 8 - 27 mg/dL  Creatinine, Ser 2.77 (H) 0.57 - 1.00 mg/dL   GFR calc non Af Amer 17 (L) >59 mL/min/1.73   GFR calc Af Amer 19 (L) >59 mL/min/1.73   BUN/Creatinine Ratio 19 12 - 28   Sodium 141 134 - 144 mmol/L   Potassium 4.8 3.5 - 5.2 mmol/L   Chloride 96 96 - 106 mmol/L   CO2 28 18 - 29 mmol/L   Calcium 10.2 8.7 - 10.3 mg/dL   Total Protein 7.5 6.0 - 8.5 g/dL   Albumin 4.7 3.5 - 4.8 g/dL   Globulin, Total 2.8 1.5 - 4.5 g/dL   Albumin/Globulin Ratio 1.7 1.2 - 2.2   Bilirubin Total 0.4 0.0 - 1.2 mg/dL   Alkaline Phosphatase 84 39 - 117 IU/L   AST 21 0 - 40 IU/L   ALT 19 0 - 32 IU/L   Chemistry (most recent): Lab Results  Component Value Date   NA 141 02/18/2016   K 4.8 02/18/2016   CL 96 02/18/2016   CO2 28 02/18/2016   BUN 53 (H) 02/18/2016   CREATININE 2.77 (H) 02/18/2016   Diabetic Labs (most recent): Lab Results  Component Value Date   HGBA1C 9.7 (H) 02/18/2016   HGBA1C 8.7 (H) 09/25/2015   HGBA1C 9.4 (H) 07/04/2015     Most recent a1c 9.1% , generally improved from 12.3%. Lipid profile (most recent): -Her most recent glucose profile is consistent with improvement.  Assessment & Plan:   1. 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 -Patient is  advised to stick to a routine mealtimes to eat 3 meals  a day and avoid unnecessary snacks ( to snack only to correct hypoglycemia). Patient is advised to eliminate simple carbs  from their diet including cakes desserts ice cream soda (  diet and regular) , sweet tea , Candies,  chips and cookies, artificial sweeteners,   and "sugar-free" products .  This will help patient to have stable blood glucose profile and potentially lose weight. Patient is given detailed personalized glucose monitoring and insulin dosing instructions. Patient is instructed to call back with extremes of blood glucose less than 70 or greater than 300. Patient to bring meter and  blood glucose logs during their next visit.  -  She recently had ACS. - I will  continue Tresiba 40  units qhs,  readjust Humalog to 10 units 3 times a day before meals plus correction for pre-meal blood glucose above 90 mg/dL.  - She is advised to continue strict monitoring of blood glucose before meals and at bedtime. -Patient is warned not to take insulin without proper monitoring. -She is encouraged to call clinic if she registers blood glucose below 70 or above 300 mg/dL 3 tests in- a- row . -She does not have a safe oral treatment option for diabetes given advanced chronic kidney disease.  -Patient specific target for A1c, LDL, and triglycerides were discussed with patient.  I will obtain the following labs before her next visit. monitoring and   2. Essential hypertension Pt is allergic to ACEI, controleld. Advised to continue the rest of her medications.  3. Hyperlipemia -continue Crestor.   Follow up plan: Return in about 4 weeks (around 03/22/2016) for follow up with meter and logs- no labs.  Glade Lloyd, MD Phone: [phone]  Fax: [phone]   02/23/2016, 11:06 AM

## 2016-02-25 DIAGNOSIS — H524 Presbyopia: Secondary | ICD-10-CM | POA: Diagnosis not present

## 2016-03-04 ENCOUNTER — Ambulatory Visit: Payer: BLUE CROSS/BLUE SHIELD | Admitting: Cardiovascular Disease

## 2016-03-20 ENCOUNTER — Emergency Department (HOSPITAL_COMMUNITY)
Admission: EM | Admit: 2016-03-20 | Discharge: 2016-03-20 | Disposition: A | Discharge status: Home / Self Care | Payer: BLUE CROSS/BLUE SHIELD | Attending: Emergency Medicine | Admitting: Emergency Medicine

## 2016-03-20 ENCOUNTER — Emergency Department (HOSPITAL_COMMUNITY): Payer: BLUE CROSS/BLUE SHIELD

## 2016-03-20 ENCOUNTER — Encounter (HOSPITAL_COMMUNITY): Payer: Self-pay | Admitting: Emergency Medicine

## 2016-03-20 DIAGNOSIS — I252 Old myocardial infarction: Secondary | ICD-10-CM | POA: Insufficient documentation

## 2016-03-20 DIAGNOSIS — E1122 Type 2 diabetes mellitus with diabetic chronic kidney disease: Secondary | ICD-10-CM | POA: Insufficient documentation

## 2016-03-20 DIAGNOSIS — R109 Unspecified abdominal pain: Secondary | ICD-10-CM | POA: Diagnosis not present

## 2016-03-20 DIAGNOSIS — I130 Hypertensive heart and chronic kidney disease with heart failure and stage 1 through stage 4 chronic kidney disease, or unspecified chronic kidney disease: Secondary | ICD-10-CM | POA: Insufficient documentation

## 2016-03-20 DIAGNOSIS — N184 Chronic kidney disease, stage 4 (severe): Secondary | ICD-10-CM | POA: Insufficient documentation

## 2016-03-20 DIAGNOSIS — Z794 Long term (current) use of insulin: Secondary | ICD-10-CM | POA: Insufficient documentation

## 2016-03-20 DIAGNOSIS — I5043 Acute on chronic combined systolic (congestive) and diastolic (congestive) heart failure: Secondary | ICD-10-CM | POA: Insufficient documentation

## 2016-03-20 DIAGNOSIS — I25110 Atherosclerotic heart disease of native coronary artery with unstable angina pectoris: Secondary | ICD-10-CM | POA: Diagnosis not present

## 2016-03-20 DIAGNOSIS — Z79899 Other long term (current) drug therapy: Secondary | ICD-10-CM | POA: Insufficient documentation

## 2016-03-20 DIAGNOSIS — R1011 Right upper quadrant pain: Secondary | ICD-10-CM | POA: Diagnosis not present

## 2016-03-20 DIAGNOSIS — Z7982 Long term (current) use of aspirin: Secondary | ICD-10-CM | POA: Diagnosis not present

## 2016-03-20 LAB — URINALYSIS, ROUTINE W REFLEX MICROSCOPIC
Bilirubin Urine: NEGATIVE
Glucose, UA: 150 mg/dL — AB
Hgb urine dipstick: NEGATIVE
KETONES UR: NEGATIVE mg/dL
LEUKOCYTES UA: NEGATIVE
Nitrite: NEGATIVE
PH: 6 (ref 5.0–8.0)
PROTEIN: 30 mg/dL — AB
RBC / HPF: NONE SEEN RBC/hpf (ref 0–5)
Specific Gravity, Urine: 1.011 (ref 1.005–1.030)

## 2016-03-20 LAB — COMPREHENSIVE METABOLIC PANEL
ALBUMIN: 4.2 g/dL (ref 3.5–5.0)
ALT: 29 U/L (ref 14–54)
AST: 39 U/L (ref 15–41)
Alkaline Phosphatase: 70 U/L (ref 38–126)
Anion gap: 12 (ref 5–15)
BILIRUBIN TOTAL: 0.5 mg/dL (ref 0.3–1.2)
BUN: 52 mg/dL — ABNORMAL HIGH (ref 6–20)
CALCIUM: 9.6 mg/dL (ref 8.9–10.3)
CHLORIDE: 95 mmol/L — AB (ref 101–111)
CO2: 30 mmol/L (ref 22–32)
CREATININE: 2.46 mg/dL — AB (ref 0.44–1.00)
GFR calc Af Amer: 22 mL/min — ABNORMAL LOW (ref >60)
GFR calc non Af Amer: 19 mL/min — ABNORMAL LOW (ref >60)
Glucose, Bld: 162 mg/dL — ABNORMAL HIGH (ref 65–99)
POTASSIUM: 3.9 mmol/L (ref 3.5–5.1)
Sodium: 137 mmol/L (ref 135–145)
TOTAL PROTEIN: 7.9 g/dL (ref 6.5–8.1)

## 2016-03-20 LAB — CBC WITH DIFFERENTIAL/PLATELET
BASOS ABS: 0 10*3/uL (ref 0.0–0.1)
BASOS PCT: 1 %
EOS ABS: 0.1 10*3/uL (ref 0.0–0.7)
Eosinophils Relative: 1 %
HEMATOCRIT: 30.7 % — AB (ref 36.0–46.0)
HEMOGLOBIN: 9.9 g/dL — AB (ref 12.0–15.0)
Lymphocytes Relative: 24 %
Lymphs Abs: 1.2 10*3/uL (ref 0.7–4.0)
MCH: 33.2 pg (ref 26.0–34.0)
MCHC: 32.2 g/dL (ref 30.0–36.0)
MCV: 103 fL — ABNORMAL HIGH (ref 78.0–100.0)
Monocytes Absolute: 0.4 10*3/uL (ref 0.1–1.0)
Monocytes Relative: 8 %
NEUTROS ABS: 3.3 10*3/uL (ref 1.7–7.7)
NEUTROS PCT: 66 %
Platelets: 177 10*3/uL (ref 150–400)
RBC: 2.98 MIL/uL — AB (ref 3.87–5.11)
RDW: 15.2 % (ref 11.5–15.5)
WBC: 5 10*3/uL (ref 4.0–10.5)

## 2016-03-20 MED ORDER — IBUPROFEN 800 MG PO TABS
800.0000 mg | ORAL_TABLET | Freq: Three times a day (TID) | ORAL | 0 refills | Status: DC
Start: 2016-03-20 — End: 2016-07-09

## 2016-03-20 NOTE — ED Notes (Signed)
Call from woman who identifies as her daughter Phylis Bougie [phone] Number given to -pt with request to call

## 2016-03-20 NOTE — Discharge Instructions (Signed)

## 2016-03-20 NOTE — ED Provider Notes (Signed)
[facility] DEPT Provider Note   CSN: [id number] Arrival date & time: 03/20/16  1429     History   Chief Complaint Chief Complaint  Patient presents with  . Flank Pain    HPI AMANDEEP FUNKHOUSER is a 71 y.o. female.  HPI  The patient is a 71 year old female, history of congestive heart failure as well as stage IV chronic kidney disease. She has a history of a distant history of a kidney stone as well as a history of diabetes. She presents after stating that yesterday she started to develop some right-sided side pain, this is located at the right posterior lateral axillary line. She reports that this is worse with movement, she seems to get worse when she changes positions, when she was perfectly still she has no pain. She does report some increased odor to her urine but does not have dysuria, hematuria or urinary frequency and denies any stool complaints including diarrhea or rectal bleeding. She does endorse increased amounts of flatulence over the last several days. She spoke with her family doctor this morning her encouraged her to take hourly Tylenol to see if it helped, she has had several doses but does not have any significant improvement in her pain. At this time the patient does not have any pain as she is sitting on the edge of the bed talking to me. She also endorses having approximately one week of runny nose and occasional coughing. This is why she has been taking the hydrocodone cough syrup. She does not recall lifting anything, straining any muscles, feeling acute onset of pain with any activity.  Past Medical History:  Diagnosis Date  . Acute CHF (congestive heart failure) ([place]) 07/04/2015  . Acute renal failure superimposed on stage 4 chronic kidney disease ([place]) 07/04/2015  . Allergic rhinitis   . Anxiety   . Constipation   . Diabetes mellitus   . GERD (gastroesophageal reflux disease)   . Heart murmur, systolic   . History of hysterectomy   . Hyperlipidemia   .  Hypertension   . Low back pain   . Obesity   . Osteoarthritis   . Overactive bladder     Patient Active Problem List   Diagnosis Date Noted  . Acute on chronic systolic CHF (congestive heart failure) ([place]) 12/01/2015  . AKI (acute kidney injury) ([place])   . Ischemia of upper extremity   . Right knee pain   . Subclavian artery stenosis, right ([place]) 07/07/2015  . Acute on chronic combined systolic and diastolic CHF (congestive heart failure) ([place]) 07/04/2015  . Elevated troponin 07/04/2015  . NSTEMI (non-ST elevated myocardial infarction) ([place]) 07/04/2015  . Elevated d-dimer 07/04/2015  . Acute respiratory failure with hypercapnia ([place])   . Bilateral lower extremity edema 01/20/2012  . Type 2 diabetes mellitus with stage 4 chronic kidney disease ([place]) 01/21/2011  . Overweight 07/20/2009  . Atherosclerosis of native coronary artery of native heart with unstable angina pectoris ([place]) 10/08/2008  . HEART MURMUR, SYSTOLIC [id number]  . SHOULDER PAIN 02/05/2007  . Hyperlipemia 04/11/2006  . ANXIETY 04/11/2006  . SYNDROME, CARPAL TUNNEL 04/11/2006  . Essential hypertension 04/11/2006  . ALLERGIC RHINITIS 04/11/2006  . GERD 04/11/2006  . CONSTIPATION NOS 04/11/2006  . OVERACTIVE BLADDER 04/11/2006  . OSTEOARTHRITIS 04/11/2006  . LOW BACK PAIN 04/11/2006    Past Surgical History:  Procedure Laterality Date  . ABDOMINAL HYSTERECTOMY    . BACK SURGERY     multiple  . CARDIAC CATHETERIZATION  04/04/2006  Est EF of 60%  . CARDIAC CATHETERIZATION N/A 07/04/2015   Procedure: Left Heart Cath and Coronary Angiography;  Surgeon: Troy Sine, MD;  Location: [facility];  Service: Cardiovascular;  Laterality: N/A;  . CARDIAC CATHETERIZATION N/A 07/04/2015   Procedure: Coronary Stent Intervention;  Surgeon: Troy Sine, MD;  Location: [facility];  Service: Cardiovascular;  Laterality: N/A;  . CARDIAC CATHETERIZATION N/A 10/13/2015   Procedure: Left Heart Cath and Coronary  Angiography;  Surgeon: Troy Sine, MD;  Location: [facility];  Service: Cardiovascular;  Laterality: N/A;  . CARDIAC CATHETERIZATION N/A 12/02/2015   Procedure: Left Heart Cath and Coronary Angiography;  Surgeon: Peter M Martinique, MD;  Location: [facility];  Service: Cardiovascular;  Laterality: N/A;  . CARPAL TUNNEL RELEASE    . CERVICAL BIOPSY     cervical lymph node biopsies  . COLONOSCOPY  May 2002   Dr. Irving Shows :Followup in 5 years, normal exam  . COLONOSCOPY  2008   Dr. Laural Golden: Very redundant colon with mild melanosis coli, splenic flexure polyp biopsy with acute complaint of benign colon polyp. Recommended ten-year followup  . CORONARY STENT PLACEMENT  04/11/2006   2 -- Taxus stents to the circumflex   . PERIPHERAL VASCULAR CATHETERIZATION Right 07/09/2015   Procedure: Upper Extremity Angiography;  Surgeon: Conrad Bernalillo, MD;  Location: [facility];  Service: Cardiovascular;  Laterality: Right;  . PERIPHERAL VASCULAR CATHETERIZATION Right 07/10/2015   Procedure: RIGHT SUBCLAVIAN ARTERY THROMBECTOMY;  Surgeon: Serafina Mitchell, MD;  Location: [facility];  Service: Vascular;  Laterality: Right;  . tendonitis     bilateral elbow  . TRIGGER FINGER RELEASE      OB History    No data available       Home Medications    Prior to Admission medications   Medication Sig Start Date End Date Taking? Authorizing Provider  ACCU-CHEK AVIVA PLUS test strip USE TO CHECK BLOOD GLUCOSE 4 TIMES A DAY 12/14/15  Yes Cassandria Anger, MD  acetaminophen (TYLENOL) 500 MG tablet Take 2 tablets (1,000 mg total) by mouth every 6 (six) hours as needed (pain). 07/12/15  Yes Lily Kocher, MD  ALPRAZolam Duanne Moron) 0.5 MG tablet Take 0.5 mg by mouth as needed for anxiety or sleep.    Yes Historical Provider, MD  aspirin EC 81 MG tablet Take 81 mg by mouth daily.   Yes Historical Provider, MD  famotidine (PEPCID) 20 MG tablet Take 20 mg by mouth daily.   Yes Historical Provider, MD  ferrous  sulfate 325 (65 FE) MG tablet Take 325 mg by mouth daily.   Yes Historical Provider, MD  hydrALAZINE (APRESOLINE) 25 MG tablet Take 2 tablets (50 mg total) by mouth 3 (three) times daily. 01/21/16  Yes Herminio Commons, MD  HYDROcodone-homatropine [facility]) 5-1.5 MG/5ML syrup as needed. 12/08/15  Yes Historical Provider, MD  insulin degludec (TRESIBA) 100 UNIT/ML SOPN FlexTouch Pen Inject 40 Units into the skin daily at 10 pm.   Yes Historical Provider, MD  insulin lispro (HUMALOG KWIKPEN) 100 UNIT/ML KiwkPen Inject 0.1-0.16 mLs (10-16 Units total) into the skin 3 (three) times daily. 02/23/16  Yes Cassandria Anger, MD  isosorbide mononitrate (IMDUR) 60 MG 24 hr tablet Take 1 tablet (60 mg total) by mouth daily. 07/12/15  Yes Lily Kocher, MD  latanoprost (XALATAN) 0.005 % ophthalmic solution Place 1 drop into both eyes at bedtime.  10/25/11  Yes Historical Provider, MD  meclizine (ANTIVERT) 25 MG tablet Take 1 tablet by mouth daily. 03/06/16  Yes Historical Provider, MD  metoprolol succinate (TOPROL-XL) 50 MG 24 hr tablet Take 1 tablet (50 mg total) by mouth 2 (two) times daily. Take with or immediately following a meal. 10/14/15  Yes Brittainy Erie Noe, PA-C  potassium chloride SA (K-DUR,KLOR-CON) 20 MEQ tablet Take 2 tablets (40 mEq total) by mouth daily. 10/14/15  Yes Brittainy Erie Noe, PA-C  ranolazine (RANEXA) 500 MG 12 hr tablet Take 1 tablet (500 mg total) by mouth 2 (two) times daily. 10/14/15  Yes Brittainy Erie Noe, PA-C  rosuvastatin (CRESTOR) 40 MG tablet Take 1 tablet (40 mg total) by mouth at bedtime. 07/12/15  Yes Lily Kocher, MD  ticagrelor (BRILINTA) 90 MG TABS tablet Take 1 tablet (90 mg total) by mouth 2 (two) times daily. 10/14/15  Yes Brittainy Erie Noe, PA-C  torsemide (DEMADEX) 20 MG tablet Take 3 tabs (60mg ) by mouth every morning & 1 tab (20mg ) mid day, can take an extra 20mg  daily if her weight increases by >3 pounds. 12/03/15  Yes Arbutus Leas, NP  trimethoprim (TRIMPEX) 100  MG tablet Take 100 mg by mouth daily.  12/06/11  Yes Historical Provider, MD  calcitRIOL (ROCALTROL) 0.25 MCG capsule Take 0.25 mcg by mouth every Monday, Wednesday, and Friday.  03/23/15   Historical Provider, MD  ibuprofen (ADVIL,MOTRIN) 800 MG tablet Take 1 tablet (800 mg total) by mouth 3 (three) times daily. 03/20/16   Noemi Chapel, MD  metolazone (ZAROXOLYN) 2.5 MG tablet Take 1 tablet (2.5 mg total) by mouth daily. 01/21/16 01/23/16  Herminio Commons, MD  nitroGLYCERIN (NITROSTAT) 0.4 MG SL tablet Place 1 tablet (0.4 mg total) under the tongue every 5 (five) minutes as needed for chest pain. 10/14/15   Brittainy Erie Noe, PA-C    Family History Family History  Problem Relation Age of Onset  . Diabetes Father   . Hypertension Father   . Stroke Father   . Hypertension Brother   . Aneurysm Brother   . Diabetes Brother   . Colon cancer Neg Hx     Social History Social History  Substance Use Topics  . Smoking status: Never Smoker  . Smokeless tobacco: Never Used  . Alcohol use No     Allergies   Ace inhibitors and Codeine   Review of Systems Review of Systems  All other systems reviewed and are negative.    Physical Exam Updated Vital Signs BP 135/65 (BP Location: Left Arm)   Pulse 60   Temp 98.3 F (36.8 C) (Oral)   Resp 18   Ht 5\' 10"  (1.778 m)   Wt 197 lb (89.4 kg)   SpO2 100%   BMI 28.27 kg/m   Physical Exam  Constitutional: She appears well-developed and well-nourished. No distress.  HENT:  Head: Normocephalic and atraumatic.  Mouth/Throat: Oropharynx is clear and moist. No oropharyngeal exudate.  Eyes: Conjunctivae and EOM are normal. Pupils are equal, round, and reactive to light. Right eye exhibits no discharge. Left eye exhibits no discharge. No scleral icterus.  Neck: Normal range of motion. Neck supple. No JVD present. No thyromegaly present.  Cardiovascular: Normal rate, regular rhythm, normal heart sounds and intact distal pulses.  Exam reveals  no gallop and no friction rub.   No murmur heard. Pulmonary/Chest: Effort normal and breath sounds normal. No respiratory distress. She has no wheezes. She has no rales. She exhibits tenderness ( Tenderness to palpation over the right posterior axillary  line inferiorly over the inferior ribs and flank. This is minimal, there is no significant abdominal tenderness to palpation though she has a fullness in the right upper quadrant.).  Abdominal: Soft. Bowel sounds are normal. She exhibits no distension and no mass. There is no tenderness.  Musculoskeletal: Normal range of motion. She exhibits no edema or tenderness.  Lymphadenopathy:    She has no cervical adenopathy.  Neurological: She is alert. Coordination normal.  Skin: Skin is warm and dry. No rash noted. No erythema.  Psychiatric: She has a normal mood and affect. Her behavior is normal.  Nursing note and vitals reviewed.    ED Treatments / Results  Labs (all labs ordered are listed, but only abnormal results are displayed) Labs Reviewed  URINALYSIS, ROUTINE W REFLEX MICROSCOPIC - Abnormal; Notable for the following:       Result Value   Glucose, UA 150 (*)    Protein, ur 30 (*)    Bacteria, UA MANY (*)    All other components within normal limits  COMPREHENSIVE METABOLIC PANEL - Abnormal; Notable for the following:    Chloride 95 (*)    Glucose, Bld 162 (*)    BUN 52 (*)    Creatinine, Ser 2.46 (*)    GFR calc non Af Amer 19 (*)    GFR calc Af Amer 22 (*)    All other components within normal limits  CBC WITH DIFFERENTIAL/PLATELET - Abnormal; Notable for the following:    RBC 2.98 (*)    Hemoglobin 9.9 (*)    HCT 30.7 (*)    MCV 103.0 (*)    All other components within normal limits  URINE CULTURE    Radiology Ct Renal Stone Study  Result Date: 03/20/2016 CLINICAL DATA:  Right flank pain. EXAM: CT ABDOMEN AND PELVIS WITHOUT CONTRAST TECHNIQUE: Multidetector CT imaging of the abdomen and pelvis was performed following  the standard protocol without IV contrast. COMPARISON:  None. FINDINGS: Lower chest: There is a small right pleural effusion and a tiny left effusion. The lung bases are otherwise normal. Hepatobiliary: Cholelithiasis is identified without wall thickening or pericholecystic fluid. The visualized liver is unremarkable. Pancreas: Unremarkable. No pancreatic ductal dilatation or surrounding inflammatory changes. Spleen: Normal in size without focal abnormality. Adrenals/Urinary Tract: Adrenal hyperplasia. Vascular calcifications associated with the kidneys. Several small stones seen in the kidneys. No hydronephrosis or perinephric stranding. No ureterectasis or ureteral stones. The bladder is unremarkable. Stomach/Bowel: Moderate fecal loading seen throughout much of the colon. No other colonic abnormalities are identified. The appendix is seen on the coronal view with no evidence of appendicitis. The stomach and small bowel are normal. Vascular/Lymphatic: Atherosclerosis is seen in the aorta, iliac vessels, and femoral vessels. No adenopathy. Reproductive: Status post hysterectomy. Other: No free air or free fluid. No other acute abnormalities are identified. Musculoskeletal: Degenerative changes in the spine. IMPRESSION: 1. Multiple small nonobstructive stones in the kidneys. No cause for acute pain identified. 2. Atherosclerotic change in the aorta and branching vessels. 3. Small bilateral effusions, right greater than left. 4. Cholelithiasis. No wall thickening or pericholecystic fluid identified. 5. Moderate fecal loading throughout the colon. Electronically Signed   By: Dorise Bullion III M.D   On: 03/20/2016 18:25    Procedures Procedures (including critical care time)  Medications Ordered in ED Medications - No data to display   Initial Impression / Assessment and Plan / ED Course  I have reviewed the triage vital signs and the nursing notes.  Pertinent  labs & imaging results that were available  during my care of the patient were reviewed by me and considered in my medical decision making (see chart for details).  Clinical Course as of Mar 20 1917  Sun Mar 20, 2016  1607 Hemoglobin is baseline, kidney function and also baseline, urinalysis with bacteria but no hematuria or signs of infection, culture sent. Bedside ultrasound reveals possible mild hydronephrosis, we'll proceed with noncontrast CT to further evaluate for obstructive uropathy. The patient again appears comfortable unless she is being moved around on the bed again suggestive of a musculoskeletal pathology.  [BM]    Clinical Course User Index [BM] Noemi Chapel, MD   The patient's location of the pain is the right flank, this could be consistent with kidney stones, liver problems, gallbladder problems though it could also be consistent with a simple musculoskeletal issue as she has had excessive coughing. That being said she does not cough during her interview, she speaks in full sentences without any respiratory distress and has normal lung sounds. I suspect she has some viral upper respiratory symptoms which appear quite mild. We'll obtain labs, urinalysis reveals no signs of leukocytes, red blood cells though there are many bacteria but no signs of inflammatory change I doubt that this is a urinary infection and the urine will be cultured.  CT scan unremarkable other than kidney stones in the kidneys, no signs of ureteral stones, no other intra-abdominal pathology. Patient informed. Stable for discharge.  Final Clinical Impressions(s) / ED Diagnoses   Final diagnoses:  Flank pain    New Prescriptions New Prescriptions   IBUPROFEN (ADVIL,MOTRIN) 800 MG TABLET    Take 1 tablet (800 mg total) by mouth 3 (three) times daily.     Noemi Chapel, MD 03/20/16 1919

## 2016-03-20 NOTE — ED Triage Notes (Signed)
Patient c/o right flank pain that started last night. Denies any nausea, vomiting, diarrhea, fevers, or urinary symptoms. Per patient worse with movement. Patient states pain more noticeable with laying.

## 2016-03-21 ENCOUNTER — Ambulatory Visit: Payer: BLUE CROSS/BLUE SHIELD | Admitting: Cardiovascular Disease

## 2016-03-22 ENCOUNTER — Ambulatory Visit (INDEPENDENT_AMBULATORY_CARE_PROVIDER_SITE_OTHER): Payer: BLUE CROSS/BLUE SHIELD | Admitting: "Endocrinology

## 2016-03-22 ENCOUNTER — Encounter: Payer: Self-pay | Admitting: "Endocrinology

## 2016-03-22 VITALS — BP 118/72 | HR 62 | Ht 71.0 in | Wt 198.0 lb

## 2016-03-22 DIAGNOSIS — Z794 Long term (current) use of insulin: Secondary | ICD-10-CM | POA: Diagnosis not present

## 2016-03-22 DIAGNOSIS — E1122 Type 2 diabetes mellitus with diabetic chronic kidney disease: Secondary | ICD-10-CM | POA: Diagnosis not present

## 2016-03-22 DIAGNOSIS — I10 Essential (primary) hypertension: Secondary | ICD-10-CM | POA: Diagnosis not present

## 2016-03-22 DIAGNOSIS — E782 Mixed hyperlipidemia: Secondary | ICD-10-CM | POA: Diagnosis not present

## 2016-03-22 DIAGNOSIS — N184 Chronic kidney disease, stage 4 (severe): Secondary | ICD-10-CM | POA: Diagnosis not present

## 2016-03-22 LAB — URINE CULTURE

## 2016-03-22 MED ORDER — INSULIN LISPRO 100 UNIT/ML (KWIKPEN): 15.0000 [IU] | PEN_INJECTOR | Freq: Three times a day (TID) | SUBCUTANEOUS | 2 refills | Status: DC

## 2016-03-22 NOTE — Patient Instructions (Signed)

## 2016-03-22 NOTE — Progress Notes (Signed)
Subjective:    Patient ID: [name], female    DOB: 01-12-1946,    Past Medical History:  Diagnosis Date  . Acute CHF (congestive heart failure) ([place]) 07/04/2015  . Acute renal failure superimposed on stage 4 chronic kidney disease ([place]) 07/04/2015  . Allergic rhinitis   . Anxiety   . Constipation   . Diabetes mellitus   . GERD (gastroesophageal reflux disease)   . Heart murmur, systolic   . History of hysterectomy   . Hyperlipidemia   . Hypertension   . Low back pain   . Obesity   . Osteoarthritis   . Overactive bladder    Past Surgical History:  Procedure Laterality Date  . ABDOMINAL HYSTERECTOMY    . BACK SURGERY     multiple  . CARDIAC CATHETERIZATION  04/04/2006   Est EF of 60%  . CARDIAC CATHETERIZATION N/A 07/04/2015   Procedure: Left Heart Cath and Coronary Angiography;  Surgeon: Troy Sine, MD;  Location: [facility];  Service: Cardiovascular;  Laterality: N/A;  . CARDIAC CATHETERIZATION N/A 07/04/2015   Procedure: Coronary Stent Intervention;  Surgeon: Troy Sine, MD;  Location: [facility];  Service: Cardiovascular;  Laterality: N/A;  . CARDIAC CATHETERIZATION N/A 10/13/2015   Procedure: Left Heart Cath and Coronary Angiography;  Surgeon: Troy Sine, MD;  Location: [facility];  Service: Cardiovascular;  Laterality: N/A;  . CARDIAC CATHETERIZATION N/A 12/02/2015   Procedure: Left Heart Cath and Coronary Angiography;  Surgeon: Peter M Martinique, MD;  Location: [facility];  Service: Cardiovascular;  Laterality: N/A;  . CARPAL TUNNEL RELEASE    . CERVICAL BIOPSY     cervical lymph node biopsies  . COLONOSCOPY  May 2002   Dr. Irving Shows :Followup in 5 years, normal exam  . COLONOSCOPY  2008   Dr. Laural Golden: Very redundant colon with mild melanosis coli, splenic flexure polyp biopsy with acute complaint of benign colon polyp. Recommended ten-year followup  . CORONARY STENT PLACEMENT  04/11/2006   2 -- Taxus stents to the  circumflex   . PERIPHERAL VASCULAR CATHETERIZATION Right 07/09/2015   Procedure: Upper Extremity Angiography;  Surgeon: Conrad Industry, MD;  Location: [facility];  Service: Cardiovascular;  Laterality: Right;  . PERIPHERAL VASCULAR CATHETERIZATION Right 07/10/2015   Procedure: RIGHT SUBCLAVIAN ARTERY THROMBECTOMY;  Surgeon: Serafina Mitchell, MD;  Location: [facility];  Service: Vascular;  Laterality: Right;  . tendonitis     bilateral elbow  . TRIGGER FINGER RELEASE     Social History   Social History  . Marital status: Married    Spouse name: N/A  . Number of children: 3  . Years of education: N/A   Social History Main Topics  . Smoking status: Never Smoker  . Smokeless tobacco: Never Used  . Alcohol use No  . Drug use: No  . Sexual activity: Not Asked   Other Topics Concern  . None   Social History Narrative  . None   Outpatient Encounter Prescriptions as of 03/22/2016  Medication Sig  . ACCU-CHEK AVIVA PLUS test strip USE TO CHECK BLOOD GLUCOSE 4 TIMES A DAY  . acetaminophen (TYLENOL) 500 MG tablet Take 2 tablets (1,000 mg total) by mouth every 6 (six) hours as needed (pain).  Marland Kitchen ALPRAZolam (XANAX) 0.5 MG tablet Take 0.5 mg by mouth as needed for anxiety or sleep.   Marland Kitchen aspirin EC 81 MG tablet Take 81 mg by mouth  daily.  . calcitRIOL (ROCALTROL) 0.25 MCG capsule Take 0.25 mcg by mouth every Monday, Wednesday, and Friday.   . famotidine (PEPCID) 20 MG tablet Take 20 mg by mouth daily.  . ferrous sulfate 325 (65 FE) MG tablet Take 325 mg by mouth daily.  . hydrALAZINE (APRESOLINE) 25 MG tablet Take 2 tablets (50 mg total) by mouth 3 (three) times daily.  Marland Kitchen HYDROcodone-homatropine (HYCODAN) 5-1.5 MG/5ML syrup as needed.  Marland Kitchen ibuprofen (ADVIL,MOTRIN) 800 MG tablet Take 1 tablet (800 mg total) by mouth 3 (three) times daily.  . insulin degludec (TRESIBA) 100 UNIT/ML SOPN FlexTouch Pen Inject 40 Units into the skin daily at 10 pm.  . insulin lispro (HUMALOG KWIKPEN) 100 UNIT/ML KiwkPen  Inject 0.15-0.21 mLs (15-21 Units total) into the skin 3 (three) times daily.  . isosorbide mononitrate (IMDUR) 60 MG 24 hr tablet Take 1 tablet (60 mg total) by mouth daily.  Marland Kitchen latanoprost (XALATAN) 0.005 % ophthalmic solution Place 1 drop into both eyes at bedtime.   . meclizine (ANTIVERT) 25 MG tablet Take 1 tablet by mouth daily.  . metolazone (ZAROXOLYN) 2.5 MG tablet Take 1 tablet (2.5 mg total) by mouth daily.  . metoprolol succinate (TOPROL-XL) 50 MG 24 hr tablet Take 1 tablet (50 mg total) by mouth 2 (two) times daily. Take with or immediately following a meal.  . nitroGLYCERIN (NITROSTAT) 0.4 MG SL tablet Place 1 tablet (0.4 mg total) under the tongue every 5 (five) minutes as needed for chest pain.  . potassium chloride SA (K-DUR,KLOR-CON) 20 MEQ tablet Take 2 tablets (40 mEq total) by mouth daily.  . ranolazine (RANEXA) 500 MG 12 hr tablet Take 1 tablet (500 mg total) by mouth 2 (two) times daily.  . rosuvastatin (CRESTOR) 40 MG tablet Take 1 tablet (40 mg total) by mouth at bedtime.  . ticagrelor (BRILINTA) 90 MG TABS tablet Take 1 tablet (90 mg total) by mouth 2 (two) times daily.  Marland Kitchen torsemide (DEMADEX) 20 MG tablet Take 3 tabs (60mg ) by mouth every morning & 1 tab (20mg ) mid day, can take an extra 20mg  daily if her weight increases by >3 pounds.  . trimethoprim (TRIMPEX) 100 MG tablet Take 100 mg by mouth daily.   . [DISCONTINUED] insulin lispro (HUMALOG KWIKPEN) 100 UNIT/ML KiwkPen Inject 0.1-0.16 mLs (10-16 Units total) into the skin 3 (three) times daily.   No facility-administered encounter medications on file as of 03/22/2016.    ALLERGIES: Allergies  Allergen Reactions  . Ace Inhibitors Cough    Pt can tolerate Tribenzor (and ARBs)  . Codeine Rash   VACCINATION STATUS:  There is no immunization history on file for this patient.  Diabetes  She presents for her follow-up diabetic visit. She has type 2 diabetes mellitus. Her disease course has been worsening. Pertinent  negatives for hypoglycemia include no confusion, headaches, pallor or seizures. Associated symptoms include polydipsia and polyuria. Pertinent negatives for diabetes include no chest pain, no fatigue and no polyphagia. There are no hypoglycemic complications. Symptoms are worsening. Diabetic complications include heart disease, nephropathy and retinopathy. Pertinent negatives for diabetic complications include no CVA. Risk factors for coronary artery disease include diabetes mellitus, dyslipidemia, sedentary lifestyle and hypertension. Current diabetic treatment includes insulin injections. She is compliant with treatment most of the time. Her weight is stable. She is following a generally unhealthy diet. Meal planning includes avoidance of concentrated sweets. She rarely participates in exercise. Her home blood glucose trend is fluctuating dramatically. Her breakfast blood glucose range is generally  180-200 mg/dl. Her lunch blood glucose range is generally 180-200 mg/dl. Her dinner blood glucose range is generally >200 mg/dl. Her overall blood glucose range is 180-200 mg/dl. An ACE inhibitor/angiotensin II receptor blocker is contraindicated (allergic to ACEI). Eye exam is current.  Hypertension  This is a chronic problem. The current episode started more than 1 year ago. The problem has been gradually improving since onset. Pertinent negatives include no chest pain, headaches, palpitations or shortness of breath. Hypertensive end-organ damage includes retinopathy. There is no history of CVA.  Hyperlipidemia  This is a chronic problem. The current episode started more than 1 year ago. Pertinent negatives include no chest pain, myalgias or shortness of breath. Current antihyperlipidemic treatment includes statins.  Coronary Artery Disease  Pertinent negatives include no chest pain, leg swelling, palpitations or shortness of breath. Risk factors include hyperlipidemia and hypertension.     Review of Systems   Constitutional: Negative for fatigue and unexpected weight change.  HENT: Negative for trouble swallowing and voice change.   Eyes: Negative for visual disturbance.  Respiratory: Negative for cough, shortness of breath and wheezing.   Cardiovascular: Negative for chest pain, palpitations and leg swelling.  Gastrointestinal: Negative for diarrhea, nausea and vomiting.  Endocrine: Positive for polydipsia and polyuria. Negative for cold intolerance, heat intolerance and polyphagia.  Musculoskeletal: Negative for arthralgias and myalgias.  Skin: Negative for color change, pallor, rash and wound.  Neurological: Negative for seizures and headaches.  Psychiatric/Behavioral: Negative for confusion and suicidal ideas.    Objective:    BP 118/72   Pulse 62   Ht 5\' 11"  (1.803 m)   Wt 198 lb (89.8 kg)   BMI 27.62 kg/m   Wt Readings from Last 3 Encounters:  03/22/16 198 lb (89.8 kg)  03/20/16 197 lb (89.4 kg)  02/23/16 197 lb (89.4 kg)    Physical Exam  Constitutional: She is oriented to person, place, and time. She appears well-developed.  HENT:  Head: Normocephalic and atraumatic.  Eyes: EOM are normal.  Neck: Normal range of motion. Neck supple. No tracheal deviation present. No thyromegaly present.  Cardiovascular: Normal rate and regular rhythm.   Pulmonary/Chest: Effort normal and breath sounds normal.  Abdominal: Soft. Bowel sounds are normal. There is no tenderness. There is no guarding.  Musculoskeletal: Normal range of motion. She exhibits no edema.  Neurological: She is alert and oriented to person, place, and time. She has normal reflexes. No cranial nerve deficit. Coordination normal.  Skin: Skin is warm and dry. No rash noted. No erythema. No pallor.  Psychiatric: She has a normal mood and affect. Judgment normal.    Results for orders placed or performed during the hospital encounter of 03/20/16  Urinalysis, Routine w reflex microscopic- may I&O cath if menses  Result  Value Ref Range   Color, Urine YELLOW YELLOW   APPearance CLEAR CLEAR   Specific Gravity, Urine 1.011 1.005 - 1.030   pH 6.0 5.0 - 8.0   Glucose, UA 150 (A) NEGATIVE mg/dL   Hgb urine dipstick NEGATIVE NEGATIVE   Bilirubin Urine NEGATIVE NEGATIVE   Ketones, ur NEGATIVE NEGATIVE mg/dL   Protein, ur 30 (A) NEGATIVE mg/dL   Nitrite NEGATIVE NEGATIVE   Leukocytes, UA NEGATIVE NEGATIVE   RBC / HPF NONE SEEN 0 - 5 RBC/hpf   WBC, UA 0-5 0 - 5 WBC/hpf   Bacteria, UA MANY (A) NONE SEEN   Mucous PRESENT   Comprehensive metabolic panel  Result Value Ref Range   Sodium 137  135 - 145 mmol/L   Potassium 3.9 3.5 - 5.1 mmol/L   Chloride 95 (L) 101 - 111 mmol/L   CO2 30 22 - 32 mmol/L   Glucose, Bld 162 (H) 65 - 99 mg/dL   BUN 52 (H) 6 - 20 mg/dL   Creatinine, Ser 2.46 (H) 0.44 - 1.00 mg/dL   Calcium 9.6 8.9 - 10.3 mg/dL   Total Protein 7.9 6.5 - 8.1 g/dL   Albumin 4.2 3.5 - 5.0 g/dL   AST 39 15 - 41 U/L   ALT 29 14 - 54 U/L   Alkaline Phosphatase 70 38 - 126 U/L   Total Bilirubin 0.5 0.3 - 1.2 mg/dL   GFR calc non Af Amer 19 (L) >60 mL/min   GFR calc Af Amer 22 (L) >60 mL/min   Anion gap 12 5 - 15  CBC with Differential/Platelet  Result Value Ref Range   WBC 5.0 4.0 - 10.5 K/uL   RBC 2.98 (L) 3.87 - 5.11 MIL/uL   Hemoglobin 9.9 (L) 12.0 - 15.0 g/dL   HCT 30.7 (L) 36.0 - 46.0 %   MCV 103.0 (H) 78.0 - 100.0 fL   MCH 33.2 26.0 - 34.0 pg   MCHC 32.2 30.0 - 36.0 g/dL   RDW 15.2 11.5 - 15.5 %   Platelets 177 150 - 400 K/uL   Neutrophils Relative % 66 %   Neutro Abs 3.3 1.7 - 7.7 K/uL   Lymphocytes Relative 24 %   Lymphs Abs 1.2 0.7 - 4.0 K/uL   Monocytes Relative 8 %   Monocytes Absolute 0.4 0.1 - 1.0 K/uL   Eosinophils Relative 1 %   Eosinophils Absolute 0.1 0.0 - 0.7 K/uL   Basophils Relative 1 %   Basophils Absolute 0.0 0.0 - 0.1 K/uL   Chemistry (most recent): Lab Results  Component Value Date   NA 137 03/20/2016   K 3.9 03/20/2016   CL 95 (L) 03/20/2016   CO2 30  03/20/2016   BUN 52 (H) 03/20/2016   CREATININE 2.46 (H) 03/20/2016   Diabetic Labs (most recent): Lab Results  Component Value Date   HGBA1C 9.7 (H) 02/18/2016   HGBA1C 8.7 (H) 09/25/2015   HGBA1C 9.4 (H) 07/04/2015     Assessment & Plan:   1. Type 2 diabetes mellitus with stage 4 chronic kidney disease and CAD  -She came with Above target blood glucose profile, her recent  A1c was increasing to 9.7% from 8.7%, after generally improving from 12.3%. -Patient is advised to stick to a routine mealtimes to eat 3 meals  a day and avoid unnecessary snacks ( to snack only to correct hypoglycemia). Patient is advised to eliminate simple carbs  from their diet including cakes desserts ice cream soda (  diet and regular) , sweet tea , Candies,  chips and cookies, artificial sweeteners,   and "sugar-free" products .  This will help patient to have stable blood glucose profile and potentially lose weight. Patient is given detailed personalized glucose monitoring and insulin dosing instructions. Patient is instructed to call back with extremes of blood glucose less than 70 or greater than 300. Patient to bring meter and  blood glucose logs during their next visit.  -  She recently had ACS, Nephrolithiasis. - Her average blood glucoses 193 for the last 7 days improving from 1225. - I will  continue Tresiba 40  units qhs,  increase Humalog to 15 units 3 times a day before meals plus correction for pre-meal blood  glucose above 90 mg/dL.  - She is advised to continue strict monitoring of blood glucose before meals and at bedtime. -Patient is warned not to take insulin without proper monitoring. -She is encouraged to call clinic if she registers blood glucose below 70 or above 300 mg/dL 3 tests in- a- row.  -She does not have a safe oral treatment option for diabetes given advanced chronic kidney disease.  -Patient specific target for A1c, LDL, and triglycerides were discussed with patient.    2.  Essential hypertension Pt is allergic to ACEI, controleld. Advised to continue the rest of her medications.  3. Hyperlipemia -continue Crestor.   Follow up plan: Return in about 3 months (around 06/20/2016) for follow up with pre-visit labs, meter, and logs.  Glade Lloyd, MD Phone: [phone]  Fax: [phone]   03/22/2016, 11:14 AM

## 2016-03-31 ENCOUNTER — Encounter (HOSPITAL_COMMUNITY): Payer: Self-pay

## 2016-03-31 DIAGNOSIS — E1165 Type 2 diabetes mellitus with hyperglycemia: Secondary | ICD-10-CM | POA: Insufficient documentation

## 2016-03-31 DIAGNOSIS — J40 Bronchitis, not specified as acute or chronic: Secondary | ICD-10-CM | POA: Insufficient documentation

## 2016-03-31 DIAGNOSIS — N184 Chronic kidney disease, stage 4 (severe): Secondary | ICD-10-CM | POA: Insufficient documentation

## 2016-03-31 DIAGNOSIS — Z79899 Other long term (current) drug therapy: Secondary | ICD-10-CM | POA: Diagnosis not present

## 2016-03-31 DIAGNOSIS — I509 Heart failure, unspecified: Secondary | ICD-10-CM | POA: Diagnosis not present

## 2016-03-31 DIAGNOSIS — R0602 Shortness of breath: Secondary | ICD-10-CM | POA: Diagnosis present

## 2016-03-31 DIAGNOSIS — Z7982 Long term (current) use of aspirin: Secondary | ICD-10-CM | POA: Diagnosis not present

## 2016-03-31 DIAGNOSIS — I130 Hypertensive heart and chronic kidney disease with heart failure and stage 1 through stage 4 chronic kidney disease, or unspecified chronic kidney disease: Secondary | ICD-10-CM | POA: Insufficient documentation

## 2016-03-31 DIAGNOSIS — Z794 Long term (current) use of insulin: Secondary | ICD-10-CM | POA: Insufficient documentation

## 2016-03-31 DIAGNOSIS — I110 Hypertensive heart disease with heart failure: Secondary | ICD-10-CM | POA: Diagnosis not present

## 2016-03-31 DIAGNOSIS — R05 Cough: Secondary | ICD-10-CM | POA: Diagnosis not present

## 2016-03-31 NOTE — ED Triage Notes (Signed)
Pt states she feels like she is wheezing, states she has had a cough and congestion for over a week.  Pt states she took an otc allergy pill this evening and the sob started about 2 hours after taking the pill

## 2016-04-01 ENCOUNTER — Emergency Department (HOSPITAL_COMMUNITY)
Admission: EM | Admit: 2016-04-01 | Discharge: 2016-04-01 | Disposition: A | Discharge status: Home / Self Care | Payer: BLUE CROSS/BLUE SHIELD | Attending: Emergency Medicine | Admitting: Emergency Medicine

## 2016-04-01 ENCOUNTER — Emergency Department (HOSPITAL_COMMUNITY): Payer: BLUE CROSS/BLUE SHIELD

## 2016-04-01 DIAGNOSIS — E1165 Type 2 diabetes mellitus with hyperglycemia: Secondary | ICD-10-CM | POA: Diagnosis not present

## 2016-04-01 DIAGNOSIS — R739 Hyperglycemia, unspecified: Secondary | ICD-10-CM

## 2016-04-01 DIAGNOSIS — I509 Heart failure, unspecified: Secondary | ICD-10-CM

## 2016-04-01 DIAGNOSIS — J40 Bronchitis, not specified as acute or chronic: Secondary | ICD-10-CM

## 2016-04-01 DIAGNOSIS — R05 Cough: Secondary | ICD-10-CM | POA: Diagnosis not present

## 2016-04-01 DIAGNOSIS — R6889 Other general symptoms and signs: Secondary | ICD-10-CM

## 2016-04-01 LAB — I-STAT TROPONIN, ED: Troponin i, poc: 0.04 ng/mL (ref 0.00–0.08)

## 2016-04-01 LAB — BASIC METABOLIC PANEL
Anion gap: 11 (ref 5–15)
BUN: 60 mg/dL — ABNORMAL HIGH (ref 6–20)
CO2: 30 mmol/L (ref 22–32)
CREATININE: 2.33 mg/dL — AB (ref 0.44–1.00)
Calcium: 9.5 mg/dL (ref 8.9–10.3)
Chloride: 92 mmol/L — ABNORMAL LOW (ref 101–111)
GFR calc Af Amer: 23 mL/min — ABNORMAL LOW (ref >60)
GFR, EST NON AFRICAN AMERICAN: 20 mL/min — AB (ref >60)
GLUCOSE: 443 mg/dL — AB (ref 65–99)
POTASSIUM: 3.6 mmol/L (ref 3.5–5.1)
SODIUM: 133 mmol/L — AB (ref 135–145)

## 2016-04-01 LAB — BRAIN NATRIURETIC PEPTIDE: B NATRIURETIC PEPTIDE 5: 595 pg/mL — AB (ref 0.0–100.0)

## 2016-04-01 LAB — I-STAT CG4 LACTIC ACID, ED: Lactic Acid, Venous: 0.82 mmol/L (ref 0.5–1.9)

## 2016-04-01 LAB — CBC WITH DIFFERENTIAL/PLATELET
BASOS ABS: 0 10*3/uL (ref 0.0–0.1)
Basophils Relative: 0 %
EOS ABS: 0.1 10*3/uL (ref 0.0–0.7)
EOS PCT: 1 %
HCT: 32.5 % — ABNORMAL LOW (ref 36.0–46.0)
Hemoglobin: 10.6 g/dL — ABNORMAL LOW (ref 12.0–15.0)
LYMPHS PCT: 13 %
Lymphs Abs: 0.8 10*3/uL (ref 0.7–4.0)
MCH: 33.3 pg (ref 26.0–34.0)
MCHC: 32.6 g/dL (ref 30.0–36.0)
MCV: 102.2 fL — ABNORMAL HIGH (ref 78.0–100.0)
MONO ABS: 0.2 10*3/uL (ref 0.1–1.0)
Monocytes Relative: 4 %
Neutro Abs: 5 10*3/uL (ref 1.7–7.7)
Neutrophils Relative %: 82 %
PLATELETS: 217 10*3/uL (ref 150–400)
RBC: 3.18 MIL/uL — AB (ref 3.87–5.11)
RDW: 15.2 % (ref 11.5–15.5)
WBC: 6.2 10*3/uL (ref 4.0–10.5)

## 2016-04-01 MED ORDER — OSELTAMIVIR PHOSPHATE 75 MG PO CAPS: 75.0000 mg | ORAL_CAPSULE | Freq: Two times a day (BID) | ORAL | 0 refills | Status: DC

## 2016-04-01 MED ORDER — IPRATROPIUM-ALBUTEROL 0.5-2.5 (3) MG/3ML IN SOLN
3.0000 mL | Freq: Once | RESPIRATORY_TRACT | Status: AC
  Administered 2016-04-01: 3 mL via RESPIRATORY_TRACT
  Filled 2016-04-01: qty 3

## 2016-04-01 MED ORDER — ALBUTEROL SULFATE HFA 108 (90 BASE) MCG/ACT IN AERS: 1.0000 | INHALATION_SPRAY | Freq: Four times a day (QID) | RESPIRATORY_TRACT | 0 refills | Status: AC | PRN

## 2016-04-01 NOTE — ED Provider Notes (Signed)
[facility] DEPT Provider Note   CSN: [id number] Arrival date & time: 03/31/16  2349  By signing my name below, I, Hilbert Odor, attest that this documentation has been prepared under the direction and in the presence of Varney Biles, MD. Electronically Signed: Hilbert Odor, Scribe. 04/01/16. 1:23 AM. History   Chief Complaint Chief Complaint  Patient presents with  . Shortness of Breath    The history is provided by the patient. No language interpreter was used.    HPI Comments: [name] is a 71 y.o. female who presents to the Emergency Department complaining of SOB that began around 10:30 PM on 03/31/2016. She states that she took some allergy medication prior to this shortness of breath episode. She also reports associated productive cough with yellow phlegm and rhinorrhea. She denies hx of COPD and asthma. She denies CP, generalized body aches, weakness or any other flu like symptoms besides the ones mentioned already. Pt has hx of CHF and wants to ensure she is not getting worse.    Past Medical History:  Diagnosis Date  . Acute CHF (congestive heart failure) ([place]) 07/04/2015  . Acute renal failure superimposed on stage 4 chronic kidney disease ([place]) 07/04/2015  . Allergic rhinitis   . Anxiety   . Constipation   . Diabetes mellitus   . GERD (gastroesophageal reflux disease)   . Heart murmur, systolic   . History of hysterectomy   . Hyperlipidemia   . Hypertension   . Low back pain   . Obesity   . Osteoarthritis   . Overactive bladder     Patient Active Problem List   Diagnosis Date Noted  . Acute on chronic systolic CHF (congestive heart failure) ([place]) 12/01/2015  . AKI (acute kidney injury) ([place])   . Ischemia of upper extremity   . Right knee pain   . Subclavian artery stenosis, right ([place]) 07/07/2015  . Acute on chronic combined systolic and diastolic CHF (congestive heart failure) ([place]) 07/04/2015  . Elevated troponin 07/04/2015  . NSTEMI  (non-ST elevated myocardial infarction) ([place]) 07/04/2015  . Elevated d-dimer 07/04/2015  . Acute respiratory failure with hypercapnia ([place])   . Bilateral lower extremity edema 01/20/2012  . Type 2 diabetes mellitus with stage 4 chronic kidney disease ([place]) 01/21/2011  . Overweight 07/20/2009  . Atherosclerosis of native coronary artery of native heart with unstable angina pectoris ([place]) 10/08/2008  . HEART MURMUR, SYSTOLIC [id number]  . SHOULDER PAIN 02/05/2007  . Hyperlipemia 04/11/2006  . ANXIETY 04/11/2006  . SYNDROME, CARPAL TUNNEL 04/11/2006  . Essential hypertension 04/11/2006  . ALLERGIC RHINITIS 04/11/2006  . GERD 04/11/2006  . CONSTIPATION NOS 04/11/2006  . OVERACTIVE BLADDER 04/11/2006  . OSTEOARTHRITIS 04/11/2006  . LOW BACK PAIN 04/11/2006    Past Surgical History:  Procedure Laterality Date  . ABDOMINAL HYSTERECTOMY    . BACK SURGERY     multiple  . CARDIAC CATHETERIZATION  04/04/2006   Est EF of 60%  . CARDIAC CATHETERIZATION N/A 07/04/2015   Procedure: Left Heart Cath and Coronary Angiography;  Surgeon: Troy Sine, MD;  Location: [facility];  Service: Cardiovascular;  Laterality: N/A;  . CARDIAC CATHETERIZATION N/A 07/04/2015   Procedure: Coronary Stent Intervention;  Surgeon: Troy Sine, MD;  Location: [facility];  Service: Cardiovascular;  Laterality: N/A;  . CARDIAC CATHETERIZATION N/A 10/13/2015   Procedure: Left Heart Cath and Coronary Angiography;  Surgeon: Troy Sine, MD;  Location: [facility];  Service: Cardiovascular;  Laterality:  N/A;  . CARDIAC CATHETERIZATION N/A 12/02/2015   Procedure: Left Heart Cath and Coronary Angiography;  Surgeon: Peter M Martinique, MD;  Location: [facility];  Service: Cardiovascular;  Laterality: N/A;  . CARPAL TUNNEL RELEASE    . CERVICAL BIOPSY     cervical lymph node biopsies  . COLONOSCOPY  May 2002   Dr. Irving Shows :Followup in 5 years, normal exam  . COLONOSCOPY  2008   Dr. Laural Golden:  Very redundant colon with mild melanosis coli, splenic flexure polyp biopsy with acute complaint of benign colon polyp. Recommended ten-year followup  . CORONARY STENT PLACEMENT  04/11/2006   2 -- Taxus stents to the circumflex   . PERIPHERAL VASCULAR CATHETERIZATION Right 07/09/2015   Procedure: Upper Extremity Angiography;  Surgeon: Conrad [place], MD;  Location: [facility];  Service: Cardiovascular;  Laterality: Right;  . PERIPHERAL VASCULAR CATHETERIZATION Right 07/10/2015   Procedure: RIGHT SUBCLAVIAN ARTERY THROMBECTOMY;  Surgeon: Serafina Mitchell, MD;  Location: [facility];  Service: Vascular;  Laterality: Right;  . tendonitis     bilateral elbow  . TRIGGER FINGER RELEASE      OB History    No data available       Home Medications    Prior to Admission medications   Medication Sig Start Date End Date Taking? Authorizing Provider  ACCU-CHEK AVIVA PLUS test strip USE TO CHECK BLOOD GLUCOSE 4 TIMES A DAY 12/14/15   Cassandria Anger, MD  acetaminophen (TYLENOL) 500 MG tablet Take 2 tablets (1,000 mg total) by mouth every 6 (six) hours as needed (pain). 07/12/15   Lily Kocher, MD  albuterol (PROVENTIL HFA;VENTOLIN HFA) 108 (90 Base) MCG/ACT inhaler Inhale 1-2 puffs into the lungs every 6 (six) hours as needed for wheezing or shortness of breath. 04/01/16   Varney Biles, MD  ALPRAZolam Duanne Moron) 0.5 MG tablet Take 0.5 mg by mouth as needed for anxiety or sleep.     Historical Provider, MD  aspirin EC 81 MG tablet Take 81 mg by mouth daily.    Historical Provider, MD  calcitRIOL (ROCALTROL) 0.25 MCG capsule Take 0.25 mcg by mouth every Monday, Wednesday, and Friday.  03/23/15   Historical Provider, MD  famotidine (PEPCID) 20 MG tablet Take 20 mg by mouth daily.    Historical Provider, MD  ferrous sulfate 325 (65 FE) MG tablet Take 325 mg by mouth daily.    Historical Provider, MD  hydrALAZINE (APRESOLINE) 25 MG tablet Take 2 tablets (50 mg total) by mouth 3 (three) times daily. 01/21/16    Herminio Commons, MD  HYDROcodone-homatropine [facility]) 5-1.5 MG/5ML syrup as needed. 12/08/15   Historical Provider, MD  ibuprofen (ADVIL,MOTRIN) 800 MG tablet Take 1 tablet (800 mg total) by mouth 3 (three) times daily. 03/20/16   Noemi Chapel, MD  insulin degludec (TRESIBA) 100 UNIT/ML SOPN FlexTouch Pen Inject 40 Units into the skin daily at 10 pm.    Historical Provider, MD  insulin lispro (HUMALOG KWIKPEN) 100 UNIT/ML KiwkPen Inject 0.15-0.21 mLs (15-21 Units total) into the skin 3 (three) times daily. 03/22/16   Cassandria Anger, MD  isosorbide mononitrate (IMDUR) 60 MG 24 hr tablet Take 1 tablet (60 mg total) by mouth daily. 07/12/15   Lily Kocher, MD  latanoprost (XALATAN) 0.005 % ophthalmic solution Place 1 drop into both eyes at bedtime.  10/25/11   Historical Provider, MD  meclizine (ANTIVERT) 25 MG tablet Take 1 tablet by mouth daily. 03/06/16   Historical Provider, MD  metolazone (ZAROXOLYN) 2.5 MG tablet Take 1 tablet (2.5 mg total) by mouth daily. 01/21/16 01/23/16  Herminio Commons, MD  metoprolol succinate (TOPROL-XL) 50 MG 24 hr tablet Take 1 tablet (50 mg total) by mouth 2 (two) times daily. Take with or immediately following a meal. 10/14/15   Brittainy Erie Noe, PA-C  nitroGLYCERIN (NITROSTAT) 0.4 MG SL tablet Place 1 tablet (0.4 mg total) under the tongue every 5 (five) minutes as needed for chest pain. 10/14/15   Brittainy Erie Noe, PA-C  oseltamivir (TAMIFLU) 75 MG capsule Take 1 capsule (75 mg total) by mouth every 12 (twelve) hours. 04/01/16   Varney Biles, MD  potassium chloride SA (K-DUR,KLOR-CON) 20 MEQ tablet Take 2 tablets (40 mEq total) by mouth daily. 10/14/15   Brittainy Erie Noe, PA-C  ranolazine (RANEXA) 500 MG 12 hr tablet Take 1 tablet (500 mg total) by mouth 2 (two) times daily. 10/14/15   Brittainy Erie Noe, PA-C  rosuvastatin (CRESTOR) 40 MG tablet Take 1 tablet (40 mg total) by mouth at bedtime. 07/12/15   Lily Kocher, MD  ticagrelor (BRILINTA) 90 MG TABS  tablet Take 1 tablet (90 mg total) by mouth 2 (two) times daily. 10/14/15   Brittainy Erie Noe, PA-C  torsemide (DEMADEX) 20 MG tablet Take 3 tabs (60mg ) by mouth every morning & 1 tab (20mg ) mid day, can take an extra 20mg  daily if her weight increases by >3 pounds. 12/03/15   Arbutus Leas, NP  trimethoprim (TRIMPEX) 100 MG tablet Take 100 mg by mouth daily.  12/06/11   Historical Provider, MD    Family History Family History  Problem Relation Age of Onset  . Diabetes Father   . Hypertension Father   . Stroke Father   . Hypertension Brother   . Aneurysm Brother   . Diabetes Brother   . Colon cancer Neg Hx     Social History Social History  Substance Use Topics  . Smoking status: Never Smoker  . Smokeless tobacco: Never Used  . Alcohol use No     Allergies   Ace inhibitors and Codeine   Review of Systems Review of Systems A complete 10 system review of systems was obtained and all systems are negative except as noted in the HPI and PMH.    Physical Exam Updated Vital Signs BP 156/65   Pulse 62   Temp 98.1 F (36.7 C) (Oral)   Resp 15   Ht 5\' 10"  (1.778 m)   Wt 197 lb (89.4 kg)   SpO2 98%   BMI 28.27 kg/m   Physical Exam  Constitutional: She is oriented to person, place, and time. She appears well-developed and well-nourished.  HENT:  Head: Normocephalic.  Eyes: EOM are normal.  Neck: Normal range of motion.  Cardiovascular: Normal rate and regular rhythm.   2+ and equal radial pulses  Pulmonary/Chest: Effort normal. She has rales (Bibasilar).  Abdominal: She exhibits no distension.  Musculoskeletal: Normal range of motion.  Neurological: She is alert and oriented to person, place, and time.  Psychiatric: She has a normal mood and affect.  Nursing note and vitals reviewed.    ED Treatments / Results  DIAGNOSTIC STUDIES: Oxygen Saturation is 99% on RA, normal by my interpretation.    COORDINATION OF CARE: 12:59 AM Discussed treatment plan with pt at  bedside, which includes CXR and labs, and pt agreed to plan.  Labs (all labs ordered are listed, but only abnormal results are displayed) Labs Reviewed  BASIC METABOLIC PANEL -  Abnormal; Notable for the following:       Result Value   Sodium 133 (*)    Chloride 92 (*)    Glucose, Bld 443 (*)    BUN 60 (*)    Creatinine, Ser 2.33 (*)    GFR calc non Af Amer 20 (*)    GFR calc Af Amer 23 (*)    All other components within normal limits  CBC WITH DIFFERENTIAL/PLATELET - Abnormal; Notable for the following:    RBC 3.18 (*)    Hemoglobin 10.6 (*)    HCT 32.5 (*)    MCV 102.2 (*)    All other components within normal limits  BRAIN NATRIURETIC PEPTIDE - Abnormal; Notable for the following:    B Natriuretic Peptide 595.0 (*)    All other components within normal limits  I-STAT TROPOININ, ED  I-STAT CG4 LACTIC ACID, ED  I-STAT TROPOININ, ED  I-STAT TROPOININ, ED    EKG  EKG Interpretation  Date/Time:  Friday April 01 2016 00:33:27 EST Ventricular Rate:  79 PR Interval:    QRS Duration: 105 QT Interval:  378 QTC Calculation: 434 R Axis:   35 Text Interpretation:  Sinus rhythm Multiple ventricular premature complexes Prolonged PR interval Repol abnrm suggests ischemia, anterolateral Nonspecific ST and T wave abnormality Confirmed by Kathrynn Humble, MD, [place] Bula [phone]) on 04/01/2016 12:59:30 AM       Radiology Dg Chest 2 View  Result Date: 04/01/2016 CLINICAL DATA:  Dyspnea and cough x1 month EXAM: CHEST  2 VIEW COMPARISON:  CXR exams from 01/10/2016 and 01/21/2016 FINDINGS: Heart size is normal. There is minimal aortic atherosclerosis. Mild diffuse interstitial prominence likely reflects bronchitic change with lower lobe minimal peribronchial thickening. No cephalization of the pulmonary vessels. No effusion. Mild degenerative change along the dorsal spine. IMPRESSION: Mild increase in interstitial lung markings with peribronchial thickening suggesting bronchitic change, lower lobe  predominant. Electronically Signed   By: Ashley Royalty M.D.   On: 04/01/2016 01:49    Procedures Procedures (including critical care time)  Medications Ordered in ED Medications  ipratropium-albuterol (DUONEB) 0.5-2.5 (3) MG/3ML nebulizer solution 3 mL (3 mLs Nebulization Given 04/01/16 0139)     Initial Impression / Assessment and Plan / ED Course  I have reviewed the triage vital signs and the nursing notes.  Pertinent labs & imaging results that were available during my care of the patient were reviewed by me and considered in my medical decision making (see chart for details).  Clinical Course as of Apr 02 355  Fri Apr 01, 2016  [id number] Results from the ER workup discussed with the patient face to face and all questions answered to the best of my ability.   [AN]    Clinical Course User Index [AN] Varney Biles, MD    Pt comes in with cc of DIB. Pt has some rales at the base. She is not acutely dyspneic anymore. Pt has hyperglycemia, but no DKA - reports that she missed her DM meds. Pt is having some URI like symptoms - but no other flu like symptoms.  DDX: Flu CHF exacerbation COPD   Final Clinical Impressions(s) / ED Diagnoses   Final diagnoses:  Bronchitis  Congestive heart failure, unspecified congestive heart failure chronicity, unspecified congestive heart failure type (HCC)  Hyperglycemia  Flu-like symptoms    New Prescriptions New Prescriptions   ALBUTEROL (PROVENTIL HFA;VENTOLIN HFA) 108 (90 BASE) MCG/ACT INHALER    Inhale 1-2 puffs into the lungs every 6 (six) hours as needed  for wheezing or shortness of breath.   OSELTAMIVIR (TAMIFLU) 75 MG CAPSULE    Take 1 capsule (75 mg total) by mouth every 12 (twelve) hours.  I personally performed the services described in this documentation, which was scribed in my presence. The recorded information has been reviewed and is accurate.    Varney Biles, MD 04/01/16 [phone]

## 2016-04-01 NOTE — Discharge Instructions (Signed)
We saw you in the ER for the shortness of breath. All of our cardiac workup is normal, including labs, EKG and chest X-RAY are normal - EXCEPT FOR CHEST XRAYS SHOWING SOME BRONCHITIS.  Flu is also possible - and so we have given you the prescription for tamiflu. TAMIFLU DOESN'T CURE FLU, just helps with the symptoms.  The workup in the ER is not complete, and you should follow up with your primary care doctor for further evaluation.  Consider taking double the dose of lasix today and tomorrow.

## 2016-04-13 ENCOUNTER — Encounter: Payer: Self-pay | Admitting: "Endocrinology

## 2016-04-13 DIAGNOSIS — I129 Hypertensive chronic kidney disease with stage 1 through stage 4 chronic kidney disease, or unspecified chronic kidney disease: Secondary | ICD-10-CM | POA: Diagnosis not present

## 2016-04-13 DIAGNOSIS — N3281 Overactive bladder: Secondary | ICD-10-CM | POA: Diagnosis not present

## 2016-04-13 DIAGNOSIS — E119 Type 2 diabetes mellitus without complications: Secondary | ICD-10-CM | POA: Diagnosis not present

## 2016-04-13 DIAGNOSIS — I214 Non-ST elevation (NSTEMI) myocardial infarction: Secondary | ICD-10-CM | POA: Diagnosis not present

## 2016-04-13 DIAGNOSIS — Z794 Long term (current) use of insulin: Secondary | ICD-10-CM | POA: Diagnosis not present

## 2016-04-13 DIAGNOSIS — D631 Anemia in chronic kidney disease: Secondary | ICD-10-CM | POA: Diagnosis not present

## 2016-04-13 DIAGNOSIS — N390 Urinary tract infection, site not specified: Secondary | ICD-10-CM | POA: Diagnosis not present

## 2016-04-13 DIAGNOSIS — N184 Chronic kidney disease, stage 4 (severe): Secondary | ICD-10-CM | POA: Diagnosis not present

## 2016-04-13 DIAGNOSIS — G458 Other transient cerebral ischemic attacks and related syndromes: Secondary | ICD-10-CM | POA: Diagnosis not present

## 2016-04-13 DIAGNOSIS — Z6830 Body mass index (BMI) 30.0-30.9, adult: Secondary | ICD-10-CM | POA: Diagnosis not present

## 2016-04-13 DIAGNOSIS — E1122 Type 2 diabetes mellitus with diabetic chronic kidney disease: Secondary | ICD-10-CM | POA: Diagnosis not present

## 2016-04-13 DIAGNOSIS — N2581 Secondary hyperparathyroidism of renal origin: Secondary | ICD-10-CM | POA: Diagnosis not present

## 2016-04-18 DIAGNOSIS — E1142 Type 2 diabetes mellitus with diabetic polyneuropathy: Secondary | ICD-10-CM | POA: Diagnosis not present

## 2016-04-18 DIAGNOSIS — L851 Acquired keratosis [keratoderma] palmaris et plantaris: Secondary | ICD-10-CM | POA: Diagnosis not present

## 2016-04-18 DIAGNOSIS — B351 Tinea unguium: Secondary | ICD-10-CM | POA: Diagnosis not present

## 2016-04-21 ENCOUNTER — Ambulatory Visit (INDEPENDENT_AMBULATORY_CARE_PROVIDER_SITE_OTHER): Payer: BLUE CROSS/BLUE SHIELD | Admitting: Otolaryngology

## 2016-04-21 DIAGNOSIS — R0982 Postnasal drip: Secondary | ICD-10-CM

## 2016-04-21 DIAGNOSIS — J342 Deviated nasal septum: Secondary | ICD-10-CM | POA: Diagnosis not present

## 2016-04-21 DIAGNOSIS — J310 Chronic rhinitis: Secondary | ICD-10-CM | POA: Diagnosis not present

## 2016-04-22 DIAGNOSIS — N3020 Other chronic cystitis without hematuria: Secondary | ICD-10-CM | POA: Diagnosis not present

## 2016-04-27 DIAGNOSIS — H401134 Primary open-angle glaucoma, bilateral, indeterminate stage: Secondary | ICD-10-CM | POA: Diagnosis not present

## 2016-04-28 ENCOUNTER — Other Ambulatory Visit: Payer: Self-pay | Admitting: Cardiology

## 2016-05-03 ENCOUNTER — Other Ambulatory Visit: Payer: Self-pay | Admitting: Cardiology

## 2016-05-05 ENCOUNTER — Ambulatory Visit: Payer: Medicare Other | Admitting: Cardiovascular Disease

## 2016-05-09 ENCOUNTER — Ambulatory Visit (INDEPENDENT_AMBULATORY_CARE_PROVIDER_SITE_OTHER): Payer: Medicare Other | Admitting: Cardiovascular Disease

## 2016-05-09 ENCOUNTER — Encounter: Payer: Self-pay | Admitting: Cardiovascular Disease

## 2016-05-09 VITALS — BP 130/78 | HR 77 | Ht 70.0 in | Wt 202.0 lb

## 2016-05-09 DIAGNOSIS — I10 Essential (primary) hypertension: Secondary | ICD-10-CM

## 2016-05-09 DIAGNOSIS — I5042 Chronic combined systolic (congestive) and diastolic (congestive) heart failure: Secondary | ICD-10-CM | POA: Diagnosis not present

## 2016-05-09 DIAGNOSIS — I779 Disorder of arteries and arterioles, unspecified: Secondary | ICD-10-CM | POA: Diagnosis not present

## 2016-05-09 DIAGNOSIS — I739 Peripheral vascular disease, unspecified: Secondary | ICD-10-CM | POA: Diagnosis not present

## 2016-05-09 DIAGNOSIS — I209 Angina pectoris, unspecified: Secondary | ICD-10-CM

## 2016-05-09 DIAGNOSIS — I110 Hypertensive heart disease with heart failure: Secondary | ICD-10-CM

## 2016-05-09 DIAGNOSIS — E7800 Pure hypercholesterolemia, unspecified: Secondary | ICD-10-CM | POA: Diagnosis not present

## 2016-05-09 DIAGNOSIS — I25118 Atherosclerotic heart disease of native coronary artery with other forms of angina pectoris: Secondary | ICD-10-CM | POA: Diagnosis not present

## 2016-05-09 NOTE — Patient Instructions (Signed)

## 2016-05-09 NOTE — Progress Notes (Signed)
SUBJECTIVE: The patient presents for follow up of CAD and CHF.  Coronary angiography demonstrated the following:  Left Heart Cath and Coronary Angiography 12/02/15  Mid LAD lesion, 60 %stenosed.  Prox Cx to Mid Cx lesion, 99 %stenosed.  Mid RCA lesion, 0 %stenosed at site of prior stent  Dist RCA lesion, 30%stenosed at site of prior stent.  Prox RCA lesion, 40 %stenosed.  Ost Cx to Prox Cx lesion, 80 %stenosed.  LV end diastolic pressure is moderately elevated.  1. 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 Patent stents in the mid RCA and distal RCA/PDA 3. Moderate mid LAD disease- unchanged from prior studies. 4. Elevated LVEDP  Echo 2015-10-31 LVEF [id number], grade 2 diastolic dysfunction.  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  Her ischemic symptoms are back pain and SOB.  She was evaluated in the ED for shortness of breath on 04/01/16. Point of care troponin was normal. BNP was elevated at 595. BUN 60, creatinine 2.33. Hemoglobin low at 10.6.  Chest x-ray showed mild increase in interstitial lung markings with peribronchial thickening suggesting bronchitic change, lowered lobe predominant. There was no pulmonary edema.  ECG showed sinus rhythm with PVCs and nonspecific ST segment depressions and T-wave inversions in inferolateral leads.  She has had problems with bronchitis and allergies due to a dog allergy. She continues to drive a school bus.    Review of Systems: As per "subjective", otherwise negative.  Allergies  Allergen Reactions  . Ace Inhibitors Cough    Pt can tolerate Tribenzor (and ARBs)  . Codeine Rash    Current Outpatient Prescriptions  Medication Sig Dispense Refill  . ACCU-CHEK AVIVA PLUS test strip USE TO CHECK BLOOD GLUCOSE 4 TIMES A DAY 150 each 3  .  acetaminophen (TYLENOL) 500 MG tablet Take 2 tablets (1,000 mg total) by mouth every 6 (six) hours as needed (pain). 30 tablet 0  . albuterol (PROVENTIL HFA;VENTOLIN HFA) 108 (90 Base) MCG/ACT inhaler Inhale 1-2 puffs into the lungs every 6 (six) hours as needed for wheezing or shortness of breath. 1 Inhaler 0  . ALPRAZolam (XANAX) 0.5 MG tablet Take 0.5 mg by mouth as needed for anxiety or sleep.     Marland Kitchen aspirin EC 81 MG tablet Take 81 mg by mouth daily.    . calcitRIOL (ROCALTROL) 0.25 MCG capsule Take 0.25 mcg by mouth every Monday, Wednesday, and Friday.     . famotidine (PEPCID) 20 MG tablet Take 20 mg by mouth daily.    . ferrous sulfate 325 (65 FE) MG tablet Take 325 mg by mouth daily.    . hydrALAZINE (APRESOLINE) 25 MG tablet Take 2 tablets (50 mg total) by mouth 3 (three) times daily.    Marland Kitchen HYDROcodone-homatropine (HYCODAN) 5-1.5 MG/5ML syrup as needed.    Marland Kitchen ibuprofen (ADVIL,MOTRIN) 800 MG tablet Take 1 tablet (800 mg total) by mouth 3 (three) times daily. 21 tablet 0  . insulin degludec (TRESIBA) 100 UNIT/ML SOPN FlexTouch Pen Inject 40 Units into the skin daily at 10 pm.    . insulin lispro (HUMALOG KWIKPEN) 100 UNIT/ML KiwkPen Inject 0.15-0.21 mLs (15-21 Units total) into the skin 3 (three) times daily. 20 mL 2  . isosorbide mononitrate (IMDUR) 60 MG 24 hr tablet Take 1 tablet (60 mg total) by mouth daily. 30 tablet 0  . latanoprost (XALATAN)  0.005 % ophthalmic solution Place 1 drop into both eyes at bedtime.     . meclizine (ANTIVERT) 25 MG tablet Take 1 tablet by mouth daily.    . metoprolol succinate (TOPROL-XL) 50 MG 24 hr tablet TAKE ONE TABLET BY MOUTH TWICE DAILY WITH  OR  IMMEDIATELY  FOLLOWING  A  MEAL 60 tablet 5  . nitroGLYCERIN (NITROSTAT) 0.4 MG SL tablet Place 1 tablet (0.4 mg total) under the tongue every 5 (five) minutes as needed for chest pain. 25 tablet 2  . potassium chloride SA (K-DUR,KLOR-CON) 20 MEQ tablet Take 2 tablets (40 mEq total) by mouth daily. 60 tablet 5  .  RANEXA 500 MG 12 hr tablet TAKE ONE TABLET BY MOUTH TWICE DAILY 60 tablet 5  . rosuvastatin (CRESTOR) 40 MG tablet Take 1 tablet (40 mg total) by mouth at bedtime. 30 tablet 0  . ticagrelor (BRILINTA) 90 MG TABS tablet Take 1 tablet (90 mg total) by mouth 2 (two) times daily. 60 tablet 0  . torsemide (DEMADEX) 20 MG tablet Take 3 tabs (60mg ) by mouth every morning & 1 tab (20mg ) mid day, can take an extra 20mg  daily if her weight increases by >3 pounds. 140 tablet 12  . trimethoprim (TRIMPEX) 100 MG tablet Take 100 mg by mouth daily.     . metolazone (ZAROXOLYN) 2.5 MG tablet Take 1 tablet (2.5 mg total) by mouth daily. 2 tablet 0   No current facility-administered medications for this visit.     Past Medical History:  Diagnosis Date  . Acute CHF (congestive heart failure) ([place]) 07/04/2015  . Acute renal failure superimposed on stage 4 chronic kidney disease ([place]) 07/04/2015  . Allergic rhinitis   . Anxiety   . Constipation   . Diabetes mellitus   . GERD (gastroesophageal reflux disease)   . Heart murmur, systolic   . History of hysterectomy   . Hyperlipidemia   . Hypertension   . Low back pain   . Obesity   . Osteoarthritis   . Overactive bladder     Past Surgical History:  Procedure Laterality Date  . ABDOMINAL HYSTERECTOMY    . BACK SURGERY     multiple  . CARDIAC CATHETERIZATION  04/04/2006   Est EF of 60%  . CARDIAC CATHETERIZATION N/A 07/04/2015   Procedure: Left Heart Cath and Coronary Angiography;  Surgeon: Troy Sine, MD;  Location: [facility];  Service: Cardiovascular;  Laterality: N/A;  . CARDIAC CATHETERIZATION N/A 07/04/2015   Procedure: Coronary Stent Intervention;  Surgeon: Troy Sine, MD;  Location: [facility];  Service: Cardiovascular;  Laterality: N/A;  . CARDIAC CATHETERIZATION N/A 10/13/2015   Procedure: Left Heart Cath and Coronary Angiography;  Surgeon: Troy Sine, MD;  Location: [facility];  Service: Cardiovascular;   Laterality: N/A;  . CARDIAC CATHETERIZATION N/A 12/02/2015   Procedure: Left Heart Cath and Coronary Angiography;  Surgeon: Peter M Martinique, MD;  Location: [facility];  Service: Cardiovascular;  Laterality: N/A;  . CARPAL TUNNEL RELEASE    . CERVICAL BIOPSY     cervical lymph node biopsies  . COLONOSCOPY  May 2002   Dr. Irving Shows :Followup in 5 years, normal exam  . COLONOSCOPY  2008   Dr. Laural Golden: Very redundant colon with mild melanosis coli, splenic flexure polyp biopsy with acute complaint of benign colon polyp. Recommended ten-year followup  . CORONARY STENT PLACEMENT  04/11/2006   2 -- Taxus stents to the circumflex   .  PERIPHERAL VASCULAR CATHETERIZATION Right 07/09/2015   Procedure: Upper Extremity Angiography;  Surgeon: Conrad Leeper, MD;  Location: [facility];  Service: Cardiovascular;  Laterality: Right;  . PERIPHERAL VASCULAR CATHETERIZATION Right 07/10/2015   Procedure: RIGHT SUBCLAVIAN ARTERY THROMBECTOMY;  Surgeon: Serafina Mitchell, MD;  Location: [facility];  Service: Vascular;  Laterality: Right;  . tendonitis     bilateral elbow  . TRIGGER FINGER RELEASE      Social History   Social History  . Marital status: Married    Spouse name: N/A  . Number of children: 3  . Years of education: N/A   Occupational History  . Not on file.   Social History Main Topics  . Smoking status: Never Smoker  . Smokeless tobacco: Never Used  . Alcohol use No  . Drug use: No  . Sexual activity: Not on file   Other Topics Concern  . Not on file   Social History Narrative  . No narrative on file     Vitals:   05/09/16 1122  BP: 130/78  Pulse: 77  SpO2: 99%  Weight: 202 lb (91.6 kg)  Height: 5\' 10"  (1.778 m)    PHYSICAL EXAM General: NAD HEENT: Normal. Neck: No JVD, no thyromegaly. Lungs: Clear to auscultation bilaterally with normal respiratory effort. CV: Nondisplaced PMI.  Regular rate and rhythm, normal S1/S2, no S3/S4, no murmur. No pretibial or periankle  edema.  No carotid bruit.   Abdomen: Soft, nontender, no distention.  Neurologic: Alert and oriented.  Psych: Normal affect. Skin: Normal. Musculoskeletal: No gross deformities.    ECG: Most recent ECG reviewed.      ASSESSMENT AND PLAN: 1. CAD s/p NSTEMI with PDA and RCA stenting and CTO of left circumflex with RCA in-stent restenosis s/p PCI 10/2015: Symptomatically stable. Continue dual antiplatelet therapy indefinitely with aspirin and Brilinta. Continue metoprolol, nitrates, Ranexa, and Crestor.   2. Essential HTN: Controlled. No changes. Amlodipine caused edema in the past.  3. Hyperlipidemia: Continue Crestor 40 mg.  4. Bilateral carotid artery stenosis: Mild (1-39%) on 07/07/15. Will repeat in 2 years. Continue aspirin and statin.  5. 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  6. Chronic systolic heart failure: Euvolemic. Continue torsemide 60 mg/20 mg.  Dispo: fu 4 months.   Kate Sable, M.D., F.A.C.C.

## 2016-05-26 ENCOUNTER — Ambulatory Visit (INDEPENDENT_AMBULATORY_CARE_PROVIDER_SITE_OTHER): Payer: BLUE CROSS/BLUE SHIELD | Admitting: Otolaryngology

## 2016-05-26 DIAGNOSIS — J343 Hypertrophy of nasal turbinates: Secondary | ICD-10-CM | POA: Diagnosis not present

## 2016-05-26 DIAGNOSIS — J310 Chronic rhinitis: Secondary | ICD-10-CM

## 2016-05-26 DIAGNOSIS — J330 Polyp of nasal cavity: Secondary | ICD-10-CM

## 2016-05-26 DIAGNOSIS — J342 Deviated nasal septum: Secondary | ICD-10-CM

## 2016-06-13 DIAGNOSIS — G458 Other transient cerebral ischemic attacks and related syndromes: Secondary | ICD-10-CM | POA: Diagnosis not present

## 2016-06-13 DIAGNOSIS — N184 Chronic kidney disease, stage 4 (severe): Secondary | ICD-10-CM | POA: Diagnosis not present

## 2016-06-13 DIAGNOSIS — I129 Hypertensive chronic kidney disease with stage 1 through stage 4 chronic kidney disease, or unspecified chronic kidney disease: Secondary | ICD-10-CM | POA: Diagnosis not present

## 2016-06-13 DIAGNOSIS — N2581 Secondary hyperparathyroidism of renal origin: Secondary | ICD-10-CM | POA: Diagnosis not present

## 2016-06-13 DIAGNOSIS — N390 Urinary tract infection, site not specified: Secondary | ICD-10-CM | POA: Diagnosis not present

## 2016-06-13 DIAGNOSIS — E1122 Type 2 diabetes mellitus with diabetic chronic kidney disease: Secondary | ICD-10-CM | POA: Diagnosis not present

## 2016-06-13 DIAGNOSIS — D631 Anemia in chronic kidney disease: Secondary | ICD-10-CM | POA: Diagnosis not present

## 2016-06-13 DIAGNOSIS — I214 Non-ST elevation (NSTEMI) myocardial infarction: Secondary | ICD-10-CM | POA: Diagnosis not present

## 2016-06-13 DIAGNOSIS — E119 Type 2 diabetes mellitus without complications: Secondary | ICD-10-CM | POA: Diagnosis not present

## 2016-06-13 DIAGNOSIS — Z6830 Body mass index (BMI) 30.0-30.9, adult: Secondary | ICD-10-CM | POA: Diagnosis not present

## 2016-06-13 DIAGNOSIS — N3281 Overactive bladder: Secondary | ICD-10-CM | POA: Diagnosis not present

## 2016-06-13 DIAGNOSIS — Z794 Long term (current) use of insulin: Secondary | ICD-10-CM | POA: Diagnosis not present

## 2016-06-21 ENCOUNTER — Ambulatory Visit: Payer: Medicare Other | Admitting: "Endocrinology

## 2016-06-24 ENCOUNTER — Ambulatory Visit: Payer: Medicare Other | Admitting: "Endocrinology

## 2016-06-27 ENCOUNTER — Encounter: Payer: Self-pay | Admitting: "Endocrinology

## 2016-06-27 DIAGNOSIS — L851 Acquired keratosis [keratoderma] palmaris et plantaris: Secondary | ICD-10-CM | POA: Diagnosis not present

## 2016-06-27 DIAGNOSIS — E1142 Type 2 diabetes mellitus with diabetic polyneuropathy: Secondary | ICD-10-CM | POA: Diagnosis not present

## 2016-06-27 DIAGNOSIS — B351 Tinea unguium: Secondary | ICD-10-CM | POA: Diagnosis not present

## 2016-06-27 DIAGNOSIS — S80822A Blister (nonthermal), left lower leg, initial encounter: Secondary | ICD-10-CM | POA: Diagnosis not present

## 2016-06-27 LAB — HM DIABETES EYE EXAM

## 2016-06-28 ENCOUNTER — Ambulatory Visit: Payer: Medicare Other | Admitting: Urology

## 2016-07-04 ENCOUNTER — Other Ambulatory Visit: Payer: Self-pay | Admitting: "Endocrinology

## 2016-07-09 ENCOUNTER — Encounter (HOSPITAL_COMMUNITY): Payer: Self-pay

## 2016-07-09 ENCOUNTER — Inpatient Hospital Stay (HOSPITAL_COMMUNITY)
Admission: EM | Admit: 2016-07-09 | Discharge: 2016-07-12 | DRG: 291 | Disposition: A | Discharge status: Home / Self Care | Payer: BLUE CROSS/BLUE SHIELD | Attending: Internal Medicine | Admitting: Internal Medicine

## 2016-07-09 ENCOUNTER — Emergency Department (HOSPITAL_COMMUNITY): Payer: BLUE CROSS/BLUE SHIELD

## 2016-07-09 DIAGNOSIS — J9602 Acute respiratory failure with hypercapnia: Secondary | ICD-10-CM | POA: Diagnosis not present

## 2016-07-09 DIAGNOSIS — Z888 Allergy status to other drugs, medicaments and biological substances status: Secondary | ICD-10-CM

## 2016-07-09 DIAGNOSIS — Z7902 Long term (current) use of antithrombotics/antiplatelets: Secondary | ICD-10-CM | POA: Diagnosis not present

## 2016-07-09 DIAGNOSIS — E669 Obesity, unspecified: Secondary | ICD-10-CM | POA: Diagnosis present

## 2016-07-09 DIAGNOSIS — J9601 Acute respiratory failure with hypoxia: Secondary | ICD-10-CM | POA: Diagnosis not present

## 2016-07-09 DIAGNOSIS — H9319 Tinnitus, unspecified ear: Secondary | ICD-10-CM | POA: Diagnosis present

## 2016-07-09 DIAGNOSIS — I130 Hypertensive heart and chronic kidney disease with heart failure and stage 1 through stage 4 chronic kidney disease, or unspecified chronic kidney disease: Secondary | ICD-10-CM | POA: Diagnosis not present

## 2016-07-09 DIAGNOSIS — R0602 Shortness of breath: Secondary | ICD-10-CM

## 2016-07-09 DIAGNOSIS — E785 Hyperlipidemia, unspecified: Secondary | ICD-10-CM | POA: Diagnosis present

## 2016-07-09 DIAGNOSIS — J189 Pneumonia, unspecified organism: Secondary | ICD-10-CM | POA: Diagnosis present

## 2016-07-09 DIAGNOSIS — Z7982 Long term (current) use of aspirin: Secondary | ICD-10-CM

## 2016-07-09 DIAGNOSIS — I252 Old myocardial infarction: Secondary | ICD-10-CM

## 2016-07-09 DIAGNOSIS — N184 Chronic kidney disease, stage 4 (severe): Secondary | ICD-10-CM | POA: Diagnosis not present

## 2016-07-09 DIAGNOSIS — I160 Hypertensive urgency: Secondary | ICD-10-CM | POA: Diagnosis present

## 2016-07-09 DIAGNOSIS — I10 Essential (primary) hypertension: Secondary | ICD-10-CM

## 2016-07-09 DIAGNOSIS — Z8249 Family history of ischemic heart disease and other diseases of the circulatory system: Secondary | ICD-10-CM | POA: Diagnosis not present

## 2016-07-09 DIAGNOSIS — Z79899 Other long term (current) drug therapy: Secondary | ICD-10-CM

## 2016-07-09 DIAGNOSIS — K219 Gastro-esophageal reflux disease without esophagitis: Secondary | ICD-10-CM | POA: Diagnosis present

## 2016-07-09 DIAGNOSIS — M1990 Unspecified osteoarthritis, unspecified site: Secondary | ICD-10-CM | POA: Diagnosis present

## 2016-07-09 DIAGNOSIS — J181 Lobar pneumonia, unspecified organism: Secondary | ICD-10-CM

## 2016-07-09 DIAGNOSIS — F419 Anxiety disorder, unspecified: Secondary | ICD-10-CM | POA: Diagnosis present

## 2016-07-09 DIAGNOSIS — E1165 Type 2 diabetes mellitus with hyperglycemia: Secondary | ICD-10-CM | POA: Diagnosis present

## 2016-07-09 DIAGNOSIS — I2510 Atherosclerotic heart disease of native coronary artery without angina pectoris: Secondary | ICD-10-CM | POA: Diagnosis present

## 2016-07-09 DIAGNOSIS — Z885 Allergy status to narcotic agent status: Secondary | ICD-10-CM | POA: Diagnosis not present

## 2016-07-09 DIAGNOSIS — R0600 Dyspnea, unspecified: Secondary | ICD-10-CM | POA: Diagnosis not present

## 2016-07-09 DIAGNOSIS — Z6827 Body mass index (BMI) 27.0-27.9, adult: Secondary | ICD-10-CM | POA: Diagnosis not present

## 2016-07-09 DIAGNOSIS — E1122 Type 2 diabetes mellitus with diabetic chronic kidney disease: Secondary | ICD-10-CM | POA: Diagnosis not present

## 2016-07-09 DIAGNOSIS — R7989 Other specified abnormal findings of blood chemistry: Secondary | ICD-10-CM | POA: Diagnosis not present

## 2016-07-09 DIAGNOSIS — Z794 Long term (current) use of insulin: Secondary | ICD-10-CM

## 2016-07-09 DIAGNOSIS — Z955 Presence of coronary angioplasty implant and graft: Secondary | ICD-10-CM

## 2016-07-09 DIAGNOSIS — I509 Heart failure, unspecified: Secondary | ICD-10-CM

## 2016-07-09 DIAGNOSIS — I248 Other forms of acute ischemic heart disease: Secondary | ICD-10-CM | POA: Diagnosis present

## 2016-07-09 DIAGNOSIS — I5043 Acute on chronic combined systolic (congestive) and diastolic (congestive) heart failure: Secondary | ICD-10-CM | POA: Diagnosis not present

## 2016-07-09 LAB — TROPONIN I
Troponin I: 0.03 ng/mL (ref <0.03)
Troponin I: 0.24 ng/mL (ref <0.03)
Troponin I: 0.72 ng/mL (ref <0.03)
Troponin I: 0.61 ng/mL (ref <0.03)

## 2016-07-09 LAB — BLOOD GAS, ARTERIAL
ACID-BASE EXCESS: 2.3 mmol/L — AB (ref 0.0–2.0)
Bicarbonate: 25.3 mmol/L (ref 20.0–28.0)
DRAWN BY: 105551
Delivery systems: POSITIVE
EXPIRATORY PAP: 6
FIO2: 100
Inspiratory PAP: 16
O2 SAT: 99 %
PCO2 ART: 59.9 mmHg — AB (ref 32.0–48.0)
PH ART: 7.295 — AB (ref 7.350–7.450)
PO2 ART: 303 mmHg — AB (ref 83.0–108.0)
RATE: 8 resp/min

## 2016-07-09 LAB — COMPREHENSIVE METABOLIC PANEL
ALBUMIN: 4.4 g/dL (ref 3.5–5.0)
ALK PHOS: 102 U/L (ref 38–126)
ALT: 31 U/L (ref 14–54)
ANION GAP: 13 (ref 5–15)
AST: 39 U/L (ref 15–41)
BILIRUBIN TOTAL: 0.8 mg/dL (ref 0.3–1.2)
BUN: 65 mg/dL — ABNORMAL HIGH (ref 6–20)
CHLORIDE: 92 mmol/L — AB (ref 101–111)
CO2: 28 mmol/L (ref 22–32)
Calcium: 10.2 mg/dL (ref 8.9–10.3)
Creatinine, Ser: 2.64 mg/dL — ABNORMAL HIGH (ref 0.44–1.00)
GFR calc Af Amer: 20 mL/min — ABNORMAL LOW (ref >60)
GFR calc non Af Amer: 17 mL/min — ABNORMAL LOW (ref >60)
Glucose, Bld: 617 mg/dL (ref 65–99)
Potassium: 4.6 mmol/L (ref 3.5–5.1)
Sodium: 133 mmol/L — ABNORMAL LOW (ref 135–145)
TOTAL PROTEIN: 8.9 g/dL — AB (ref 6.5–8.1)

## 2016-07-09 LAB — BASIC METABOLIC PANEL
Anion gap: 12 (ref 5–15)
BUN: 63 mg/dL — AB (ref 6–20)
CHLORIDE: 96 mmol/L — AB (ref 101–111)
CO2: 30 mmol/L (ref 22–32)
CREATININE: 2.6 mg/dL — AB (ref 0.44–1.00)
Calcium: 9.8 mg/dL (ref 8.9–10.3)
GFR calc Af Amer: 20 mL/min — ABNORMAL LOW (ref >60)
GFR calc non Af Amer: 17 mL/min — ABNORMAL LOW (ref >60)
GLUCOSE: 173 mg/dL — AB (ref 65–99)
POTASSIUM: 3.5 mmol/L (ref 3.5–5.1)
Sodium: 138 mmol/L (ref 135–145)

## 2016-07-09 LAB — GLUCOSE, CAPILLARY
GLUCOSE-CAPILLARY: 479 mg/dL — AB (ref 65–99)
GLUCOSE-CAPILLARY: 438 mg/dL — AB (ref 65–99)
GLUCOSE-CAPILLARY: 178 mg/dL — AB (ref 65–99)
GLUCOSE-CAPILLARY: 172 mg/dL — AB (ref 65–99)
GLUCOSE-CAPILLARY: 162 mg/dL — AB (ref 65–99)
Glucose-Capillary: 526 mg/dL (ref 65–99)
Glucose-Capillary: 293 mg/dL — ABNORMAL HIGH (ref 65–99)
Glucose-Capillary: 287 mg/dL — ABNORMAL HIGH (ref 65–99)
Glucose-Capillary: 261 mg/dL — ABNORMAL HIGH (ref 65–99)
Glucose-Capillary: 182 mg/dL — ABNORMAL HIGH (ref 65–99)
Glucose-Capillary: 198 mg/dL — ABNORMAL HIGH (ref 65–99)
Glucose-Capillary: 191 mg/dL — ABNORMAL HIGH (ref 65–99)
Glucose-Capillary: 164 mg/dL — ABNORMAL HIGH (ref 65–99)
Glucose-Capillary: 190 mg/dL — ABNORMAL HIGH (ref 65–99)

## 2016-07-09 LAB — CBC WITH DIFFERENTIAL/PLATELET
Basophils Absolute: 0.1 10*3/uL (ref 0.0–0.1)
Basophils Relative: 1 %
Eosinophils Absolute: 0.1 10*3/uL (ref 0.0–0.7)
Eosinophils Relative: 1 %
HEMATOCRIT: 39.7 % (ref 36.0–46.0)
Hemoglobin: 12.6 g/dL (ref 12.0–15.0)
Lymphocytes Relative: 27 %
Lymphs Abs: 2.1 10*3/uL (ref 0.7–4.0)
MCH: 32.7 pg (ref 26.0–34.0)
MCHC: 31.7 g/dL (ref 30.0–36.0)
MCV: 103.1 fL — ABNORMAL HIGH (ref 78.0–100.0)
MONO ABS: 0.3 10*3/uL (ref 0.1–1.0)
Monocytes Relative: 4 %
NEUTROS ABS: 5.2 10*3/uL (ref 1.7–7.7)
Neutrophils Relative %: 67 %
PLATELETS: 201 10*3/uL (ref 150–400)
RBC: 3.85 MIL/uL — ABNORMAL LOW (ref 3.87–5.11)
RDW: 14.9 % (ref 11.5–15.5)
WBC: 7.8 10*3/uL (ref 4.0–10.5)

## 2016-07-09 LAB — CBG MONITORING, ED
GLUCOSE-CAPILLARY: 597 mg/dL — AB (ref 65–99)
Glucose-Capillary: >600 mg/dL (ref 65–99)

## 2016-07-09 LAB — BRAIN NATRIURETIC PEPTIDE: B NATRIURETIC PEPTIDE 5: 393 pg/mL — AB (ref 0.0–100.0)

## 2016-07-09 LAB — I-STAT CG4 LACTIC ACID, ED: Lactic Acid, Venous: 1.9 mmol/L (ref 0.5–1.9)

## 2016-07-09 MED ORDER — CALCITRIOL 0.25 MCG PO CAPS
0.2500 ug | ORAL_CAPSULE | ORAL | Status: DC
  Administered 2016-07-11: 0.25 ug via ORAL
  Filled 2016-07-09: qty 1

## 2016-07-09 MED ORDER — INSULIN ASPART 100 UNIT/ML ~~LOC~~ SOLN
0.0000 [IU] | Freq: Three times a day (TID) | SUBCUTANEOUS | Status: DC
  Administered 2016-07-10: 5 [IU] via SUBCUTANEOUS
  Administered 2016-07-10: 3 [IU] via SUBCUTANEOUS
  Administered 2016-07-10: 7 [IU] via SUBCUTANEOUS
  Administered 2016-07-11: 3 [IU] via SUBCUTANEOUS
  Administered 2016-07-11 (×2): 5 [IU] via SUBCUTANEOUS
  Administered 2016-07-12 (×2): 7 [IU] via SUBCUTANEOUS

## 2016-07-09 MED ORDER — IPRATROPIUM-ALBUTEROL 0.5-2.5 (3) MG/3ML IN SOLN
3.0000 mL | Freq: Four times a day (QID) | RESPIRATORY_TRACT | Status: DC
  Administered 2016-07-09 – 2016-07-10 (×6): 3 mL via RESPIRATORY_TRACT
  Filled 2016-07-09 (×6): qty 3

## 2016-07-09 MED ORDER — ALBUTEROL SULFATE (2.5 MG/3ML) 0.083% IN NEBU: 2.5000 mg | INHALATION_SOLUTION | Freq: Four times a day (QID) | RESPIRATORY_TRACT | Status: DC

## 2016-07-09 MED ORDER — HYDRALAZINE HCL 25 MG PO TABS
50.0000 mg | ORAL_TABLET | Freq: Three times a day (TID) | ORAL | Status: DC
  Administered 2016-07-09 – 2016-07-12 (×11): 50 mg via ORAL
  Filled 2016-07-09 (×11): qty 2

## 2016-07-09 MED ORDER — FUROSEMIDE 10 MG/ML IJ SOLN
80.0000 mg | Freq: Once | INTRAMUSCULAR | Status: AC
  Administered 2016-07-09: 80 mg via INTRAVENOUS
  Filled 2016-07-09: qty 8

## 2016-07-09 MED ORDER — AZITHROMYCIN 500 MG IV SOLR
500.0000 mg | Freq: Once | INTRAVENOUS | Status: AC
  Administered 2016-07-09: 500 mg via INTRAVENOUS
  Filled 2016-07-09: qty 500

## 2016-07-09 MED ORDER — IPRATROPIUM BROMIDE 0.02 % IN SOLN: 0.5000 mg | Freq: Four times a day (QID) | RESPIRATORY_TRACT | Status: DC

## 2016-07-09 MED ORDER — ASPIRIN EC 81 MG PO TBEC
81.0000 mg | DELAYED_RELEASE_TABLET | Freq: Every day | ORAL | Status: DC
  Administered 2016-07-09 – 2016-07-12 (×4): 81 mg via ORAL
  Filled 2016-07-09 (×4): qty 1

## 2016-07-09 MED ORDER — DEXTROSE 5 % IV SOLN
1.0000 g | Freq: Once | INTRAVENOUS | Status: AC
  Administered 2016-07-09: 1 g via INTRAVENOUS
  Filled 2016-07-09: qty 10

## 2016-07-09 MED ORDER — ACETAMINOPHEN 650 MG RE SUPP: 650.0000 mg | Freq: Four times a day (QID) | RECTAL | Status: DC | PRN

## 2016-07-09 MED ORDER — ALBUTEROL SULFATE (2.5 MG/3ML) 0.083% IN NEBU
INHALATION_SOLUTION | RESPIRATORY_TRACT | Status: AC
  Administered 2016-07-09: 5 mg
  Filled 2016-07-09: qty 6

## 2016-07-09 MED ORDER — METOPROLOL SUCCINATE ER 50 MG PO TB24
50.0000 mg | ORAL_TABLET | Freq: Two times a day (BID) | ORAL | Status: DC
  Administered 2016-07-09 – 2016-07-12 (×7): 50 mg via ORAL
  Filled 2016-07-09 (×7): qty 1

## 2016-07-09 MED ORDER — INSULIN ASPART 100 UNIT/ML ~~LOC~~ SOLN
0.0000 [IU] | Freq: Every day | SUBCUTANEOUS | Status: DC
  Administered 2016-07-10: 3 [IU] via SUBCUTANEOUS

## 2016-07-09 MED ORDER — ALPRAZOLAM 0.5 MG PO TABS
0.5000 mg | ORAL_TABLET | ORAL | Status: DC | PRN
  Administered 2016-07-11: 0.5 mg via ORAL
  Filled 2016-07-09: qty 1

## 2016-07-09 MED ORDER — SODIUM CHLORIDE 0.9 % IV SOLN
INTRAVENOUS | Status: DC
  Administered 2016-07-09 – 2016-07-11 (×2): via INTRAVENOUS

## 2016-07-09 MED ORDER — GUAIFENESIN ER 600 MG PO TB12
600.0000 mg | ORAL_TABLET | Freq: Two times a day (BID) | ORAL | Status: DC
  Administered 2016-07-09 – 2016-07-12 (×7): 600 mg via ORAL
  Filled 2016-07-09 (×7): qty 1

## 2016-07-09 MED ORDER — INSULIN GLARGINE 100 UNIT/ML ~~LOC~~ SOLN
15.0000 [IU] | Freq: Every day | SUBCUTANEOUS | Status: DC
  Administered 2016-07-09 – 2016-07-10 (×2): 15 [IU] via SUBCUTANEOUS
  Filled 2016-07-09 (×3): qty 0.15

## 2016-07-09 MED ORDER — TICAGRELOR 90 MG PO TABS
90.0000 mg | ORAL_TABLET | Freq: Two times a day (BID) | ORAL | Status: DC
  Administered 2016-07-09 – 2016-07-12 (×7): 90 mg via ORAL
  Filled 2016-07-09 (×9): qty 1

## 2016-07-09 MED ORDER — FERROUS SULFATE 325 (65 FE) MG PO TABS
325.0000 mg | ORAL_TABLET | Freq: Every day | ORAL | Status: DC
  Administered 2016-07-09 – 2016-07-12 (×4): 325 mg via ORAL
  Filled 2016-07-09 (×4): qty 1

## 2016-07-09 MED ORDER — SODIUM CHLORIDE 0.9 % IV SOLN
INTRAVENOUS | Status: DC
  Administered 2016-07-09: 5.9 [IU]/h via INTRAVENOUS
  Filled 2016-07-09: qty 2.5

## 2016-07-09 MED ORDER — RANOLAZINE ER 500 MG PO TB12
500.0000 mg | ORAL_TABLET | Freq: Two times a day (BID) | ORAL | Status: DC
  Administered 2016-07-09 – 2016-07-12 (×7): 500 mg via ORAL
  Filled 2016-07-09 (×9): qty 1

## 2016-07-09 MED ORDER — ACETAMINOPHEN 500 MG PO TABS: 1000.0000 mg | ORAL_TABLET | Freq: Four times a day (QID) | ORAL | Status: DC | PRN

## 2016-07-09 MED ORDER — DEXTROSE-NACL 5-0.45 % IV SOLN
INTRAVENOUS | Status: DC
  Administered 2016-07-09 (×2): via INTRAVENOUS

## 2016-07-09 MED ORDER — ACETAMINOPHEN 325 MG PO TABS: 650.0000 mg | ORAL_TABLET | Freq: Four times a day (QID) | ORAL | Status: DC | PRN

## 2016-07-09 MED ORDER — SODIUM CHLORIDE 0.9 % IV SOLN
INTRAVENOUS | Status: AC
  Filled 2016-07-09: qty 2.5

## 2016-07-09 MED ORDER — NITROGLYCERIN IN D5W 200-5 MCG/ML-% IV SOLN
5.0000 ug/min | Freq: Once | INTRAVENOUS | Status: AC
  Administered 2016-07-09: 5 ug/min via INTRAVENOUS
  Filled 2016-07-09: qty 1250

## 2016-07-09 MED ORDER — FAMOTIDINE 20 MG PO TABS
20.0000 mg | ORAL_TABLET | Freq: Every day | ORAL | Status: DC
  Administered 2016-07-09 – 2016-07-12 (×4): 20 mg via ORAL
  Filled 2016-07-09 (×4): qty 1

## 2016-07-09 MED ORDER — DEXTROSE 5 % IV SOLN
1.0000 g | INTRAVENOUS | Status: DC
  Administered 2016-07-10 – 2016-07-12 (×3): 1 g via INTRAVENOUS
  Filled 2016-07-09 (×4): qty 10

## 2016-07-09 MED ORDER — DEXTROSE 5 % IV SOLN
500.0000 mg | INTRAVENOUS | Status: DC
  Administered 2016-07-10 – 2016-07-12 (×3): 500 mg via INTRAVENOUS
  Filled 2016-07-09 (×3): qty 500

## 2016-07-09 MED ORDER — ISOSORBIDE MONONITRATE ER 60 MG PO TB24
60.0000 mg | ORAL_TABLET | Freq: Every day | ORAL | Status: DC
  Administered 2016-07-09 – 2016-07-12 (×4): 60 mg via ORAL
  Filled 2016-07-09 (×4): qty 1

## 2016-07-09 MED ORDER — LATANOPROST 0.005 % OP SOLN
1.0000 [drp] | Freq: Every day | OPHTHALMIC | Status: DC
  Administered 2016-07-09 – 2016-07-11 (×3): 1 [drp] via OPHTHALMIC
  Filled 2016-07-09: qty 2.5

## 2016-07-09 MED ORDER — FUROSEMIDE 10 MG/ML IJ SOLN
40.0000 mg | Freq: Two times a day (BID) | INTRAMUSCULAR | Status: DC
  Administered 2016-07-09 – 2016-07-10 (×2): 40 mg via INTRAVENOUS
  Filled 2016-07-09 (×2): qty 4

## 2016-07-09 MED ORDER — ENOXAPARIN SODIUM 30 MG/0.3ML ~~LOC~~ SOLN
30.0000 mg | SUBCUTANEOUS | Status: DC
  Administered 2016-07-09 – 2016-07-12 (×4): 30 mg via SUBCUTANEOUS
  Filled 2016-07-09 (×4): qty 0.3

## 2016-07-09 MED ORDER — MECLIZINE HCL 12.5 MG PO TABS
25.0000 mg | ORAL_TABLET | Freq: Every day | ORAL | Status: DC
  Administered 2016-07-09 – 2016-07-12 (×4): 25 mg via ORAL
  Filled 2016-07-09 (×4): qty 2

## 2016-07-09 MED ORDER — ROSUVASTATIN CALCIUM 20 MG PO TABS
40.0000 mg | ORAL_TABLET | Freq: Every day | ORAL | Status: DC
  Administered 2016-07-09 – 2016-07-11 (×3): 40 mg via ORAL
  Filled 2016-07-09 (×3): qty 2

## 2016-07-09 NOTE — Progress Notes (Signed)
Patient seen and examined. Database reviewed. Discussed with multiple family members at bedside including 2 daughters. Admitted earlier today due to SOB.  Acute Hypercapnic/Hypoxic Respiratory Failure -Required NIPPV transiently. -Has been weaned to [place].  CAP -Rocephin/azithro. -Cx data pending.  Uncontrolled DM with marked Hyperglycemia -On glucostabilizer, not DKA. -Transition to SSI once meets parameters.  Acute Combined CHF -Still hypervolemic on exam. -Continue lasix 40 mg IV BID and monitor Is and Os. -ECHO pending.  Demand Ischemia -Trop has increased to 1.7. -No CP, no acute ischemic changes on EKG. -Continue to cycle troponins. -Check ECHO.  Stage IV CKD -Cr at baseline of 2.64.  Will continue to follow closely.  Domingo Mend, MD [facility] Pager: [phone]

## 2016-07-09 NOTE — ED Provider Notes (Signed)
[facility] DEPT Provider Note   CSN: [id number] Arrival date & time: 07/09/16  0303     History   Chief Complaint Chief Complaint  Patient presents with  . Shortness of Breath    HPI [name] is a 71 y.o. female.  Patient presents to the emergency part for evaluation of shortness of breath. Patient reports that she has been sick for at least a week with cough, chest congestion, nasal drainage. She reports that she has a history of "bronchitis" with similar symptoms. She uses an inhaler occasionally for this. Tonight around 10 or 10:30 PM she started having increasing shortness of breath. She is denying any chest pain associated with her symptoms.      Past Medical History:  Diagnosis Date  . Acute CHF (congestive heart failure) ([place]) 07/04/2015  . Acute renal failure superimposed on stage 4 chronic kidney disease ([place]) 07/04/2015  . Allergic rhinitis   . Anxiety   . Constipation   . Diabetes mellitus   . GERD (gastroesophageal reflux disease)   . Heart murmur, systolic   . History of hysterectomy   . Hyperlipidemia   . Hypertension   . Low back pain   . Obesity   . Osteoarthritis   . Overactive bladder     Patient Active Problem List   Diagnosis Date Noted  . Acute on chronic systolic CHF (congestive heart failure) ([place]) 12/01/2015  . AKI (acute kidney injury) ([place])   . Ischemia of upper extremity   . Right knee pain   . Subclavian artery stenosis, right ([place]) 07/07/2015  . Acute on chronic combined systolic and diastolic CHF (congestive heart failure) ([place]) 07/04/2015  . Elevated troponin 07/04/2015  . NSTEMI (non-ST elevated myocardial infarction) ([place]) 07/04/2015  . Elevated d-dimer 07/04/2015  . Acute respiratory failure with hypercapnia ([place])   . Bilateral lower extremity edema 01/20/2012  . Type 2 diabetes mellitus with stage 4 chronic kidney disease ([place]) 01/21/2011  . Overweight 07/20/2009  . Atherosclerosis of native coronary artery of  native heart with unstable angina pectoris ([place]) 10/08/2008  . HEART MURMUR, SYSTOLIC 34/19/3790  . SHOULDER PAIN 02/05/2007  . Hyperlipemia 04/11/2006  . ANXIETY 04/11/2006  . SYNDROME, CARPAL TUNNEL 04/11/2006  . Essential hypertension 04/11/2006  . ALLERGIC RHINITIS 04/11/2006  . GERD 04/11/2006  . CONSTIPATION NOS 04/11/2006  . OVERACTIVE BLADDER 04/11/2006  . OSTEOARTHRITIS 04/11/2006  . LOW BACK PAIN 04/11/2006    Past Surgical History:  Procedure Laterality Date  . ABDOMINAL HYSTERECTOMY    . BACK SURGERY     multiple  . CARDIAC CATHETERIZATION  04/04/2006   Est EF of 60%  . CARDIAC CATHETERIZATION N/A 07/04/2015   Procedure: Left Heart Cath and Coronary Angiography;  Surgeon: Troy Sine, MD;  Location: [facility];  Service: Cardiovascular;  Laterality: N/A;  . CARDIAC CATHETERIZATION N/A 07/04/2015   Procedure: Coronary Stent Intervention;  Surgeon: Troy Sine, MD;  Location: [facility];  Service: Cardiovascular;  Laterality: N/A;  . CARDIAC CATHETERIZATION N/A 10/13/2015   Procedure: Left Heart Cath and Coronary Angiography;  Surgeon: Troy Sine, MD;  Location: [facility];  Service: Cardiovascular;  Laterality: N/A;  . CARDIAC CATHETERIZATION N/A 12/02/2015   Procedure: Left Heart Cath and Coronary Angiography;  Surgeon: Peter M Martinique, MD;  Location: [facility];  Service: Cardiovascular;  Laterality: N/A;  . CARPAL TUNNEL RELEASE    . CERVICAL BIOPSY     cervical lymph node biopsies  .  COLONOSCOPY  May 2002   Dr. Irving Shows :Followup in 5 years, normal exam  . COLONOSCOPY  2008   Dr. Laural Golden: Very redundant colon with mild melanosis coli, splenic flexure polyp biopsy with acute complaint of benign colon polyp. Recommended ten-year followup  . CORONARY STENT PLACEMENT  04/11/2006   2 -- Taxus stents to the circumflex   . PERIPHERAL VASCULAR CATHETERIZATION Right 07/09/2015   Procedure: Upper Extremity Angiography;  Surgeon: Conrad Grover, MD;  Location: [facility];  Service: Cardiovascular;  Laterality: Right;  . PERIPHERAL VASCULAR CATHETERIZATION Right 07/10/2015   Procedure: RIGHT SUBCLAVIAN ARTERY THROMBECTOMY;  Surgeon: Serafina Mitchell, MD;  Location: [facility];  Service: Vascular;  Laterality: Right;  . tendonitis     bilateral elbow  . TRIGGER FINGER RELEASE      OB History    No data available       Home Medications    Prior to Admission medications   Medication Sig Start Date End Date Taking? Authorizing Provider  ACCU-CHEK AVIVA PLUS test strip USE TO CHECK BLOOD GLUCOSE 4 TIMES A DAY 07/04/16   Nida, Marella Chimes, MD  acetaminophen (TYLENOL) 500 MG tablet Take 2 tablets (1,000 mg total) by mouth every 6 (six) hours as needed (pain). 07/12/15   Lily Kocher, MD  albuterol (PROVENTIL HFA;VENTOLIN HFA) 108 [phone] Base) MCG/ACT inhaler Inhale 1-2 puffs into the lungs every 6 (six) hours as needed for wheezing or shortness of breath. 04/01/16   Varney Biles, MD  ALPRAZolam Duanne Moron) 0.5 MG tablet Take 0.5 mg by mouth as needed for anxiety or sleep.     [provider]  aspirin EC 81 MG tablet Take 81 mg by mouth daily.    [provider]  calcitRIOL (ROCALTROL) 0.25 MCG capsule Take 0.25 mcg by mouth every Monday, Wednesday, and Friday.  03/23/15   [provider]  famotidine (PEPCID) 20 MG tablet Take 20 mg by mouth daily.    [provider]  ferrous sulfate 325 (65 FE) MG tablet Take 325 mg by mouth daily.    [provider]  hydrALAZINE (APRESOLINE) 25 MG tablet Take 2 tablets (50 mg total) by mouth 3 (three) times daily. 01/21/16   Herminio Commons, MD  HYDROcodone-homatropine (HYCODAN) 5-1.5 MG/5ML syrup as needed. 12/08/15   [provider]  ibuprofen (ADVIL,MOTRIN) 800 MG tablet Take 1 tablet (800 mg total) by mouth 3 (three) times daily. 03/20/16   Noemi Chapel, MD  insulin degludec (TRESIBA) 100 UNIT/ML SOPN FlexTouch Pen Inject 40 Units into the  skin daily at 10 pm.    [provider]  insulin lispro (HUMALOG KWIKPEN) 100 UNIT/ML KiwkPen Inject 0.15-0.21 mLs (15-21 Units total) into the skin 3 (three) times daily. 03/22/16   Cassandria Anger, MD  isosorbide mononitrate (IMDUR) 60 MG 24 hr tablet Take 1 tablet (60 mg total) by mouth daily. 07/12/15   Lily Kocher, MD  latanoprost (XALATAN) 0.005 % ophthalmic solution Place 1 drop into both eyes at bedtime.  10/25/11   [provider]  meclizine (ANTIVERT) 25 MG tablet Take 1 tablet by mouth daily. 03/06/16   [provider]  metolazone (ZAROXOLYN) 2.5 MG tablet Take 1 tablet (2.5 mg total) by mouth daily. 01/21/16 01/23/16  Herminio Commons, MD  metoprolol succinate (TOPROL-XL) 50 MG 24 hr tablet TAKE ONE TABLET BY MOUTH TWICE DAILY WITH  OR  IMMEDIATELY  FOLLOWING  A  MEAL 04/28/16   Consuelo Pandy, PA-C  nitroGLYCERIN (NITROSTAT) 0.4 MG SL tablet Place 1 tablet (0.4 mg total) under the tongue every 5 (five) minutes as needed for chest pain. 10/14/15   Lyda Jester M, PA-C  potassium chloride SA (K-DUR,KLOR-CON) 20 MEQ tablet Take 2 tablets (40 mEq total) by mouth daily. 10/14/15   Rosita Fire, Brittainy M, PA-C  RANEXA 500 MG 12 hr tablet TAKE ONE TABLET BY MOUTH TWICE DAILY 05/04/16   Troy Sine, MD  rosuvastatin (CRESTOR) 40 MG tablet Take 1 tablet (40 mg total) by mouth at bedtime. 07/12/15   Lily Kocher, MD  ticagrelor (BRILINTA) 90 MG TABS tablet Take 1 tablet (90 mg total) by mouth 2 (two) times daily. 10/14/15   Lyda Jester M, PA-C  torsemide (DEMADEX) 20 MG tablet Take 3 tabs (60mg ) by mouth every morning & 1 tab (20mg ) mid day, can take an extra 20mg  daily if her weight increases by >3 pounds. 12/03/15   Arbutus Leas, NP  trimethoprim (TRIMPEX) 100 MG tablet Take 100 mg by mouth daily.  12/06/11   [provider]    Family History Family History  Problem Relation Age of Onset  . Diabetes Father   . Hypertension Father   .  Stroke Father   . Hypertension Brother   . Aneurysm Brother   . Diabetes Brother   . Colon cancer Neg Hx     Social History Social History  Substance Use Topics  . Smoking status: Never Smoker  . Smokeless tobacco: Never Used  . Alcohol use No     Allergies   Ace inhibitors and Codeine   Review of Systems Review of Systems  Respiratory: Positive for cough and shortness of breath.   All other systems reviewed and are negative.    Physical Exam Updated Vital Signs BP (!) 166/99   Pulse 86   Temp 98.5 F (36.9 C) (Oral)   Resp (!) 27   Ht 5\' 11"  (1.803 m)   Wt 185 lb (83.9 kg)   SpO2 100%   BMI 25.80 kg/m   Physical Exam  Constitutional: She is oriented to person, place, and time. She appears well-developed and well-nourished.  HENT:  Head: Normocephalic and atraumatic.  Right Ear: Hearing normal.  Left Ear: Hearing normal.  Nose: Nose normal.  Mouth/Throat: Oropharynx is clear and moist and mucous membranes are normal.  Eyes: Conjunctivae and EOM are normal. Pupils are equal, round, and reactive to light.  Neck: Normal range of motion. Neck supple.  Cardiovascular: Regular rhythm, S1 normal and S2 normal.  Exam reveals no gallop and no friction rub.   No murmur heard. Pulmonary/Chest: Accessory muscle usage present. She is in respiratory distress. She has decreased breath sounds. She has wheezes. She has rales. She exhibits no tenderness.  Abdominal: Soft. Normal appearance and bowel sounds are normal. There is no hepatosplenomegaly. There is no tenderness. There is no rebound, no guarding, no tenderness at McBurney's point and negative Murphy's sign. No hernia.  Musculoskeletal: Normal range of motion. She exhibits edema (1+ bilateral LE).  Neurological: She is alert and oriented to person, place, and time. She has normal strength. No cranial nerve deficit or sensory deficit. Coordination normal. GCS eye subscore is 4. GCS verbal subscore is 5. GCS motor subscore  is 6.  Skin: Skin is warm and intact. No rash noted. She is diaphoretic. No cyanosis.  Psychiatric: She has a normal mood and affect. Her speech is normal and behavior is normal. Thought content normal.  Nursing note and  vitals reviewed.    ED Treatments / Results  Labs (all labs ordered are listed, but only abnormal results are displayed) Labs Reviewed  CBC WITH DIFFERENTIAL/PLATELET - Abnormal; Notable for the following:       Result Value   RBC 3.85 (*)    MCV 103.1 (*)    All other components within normal limits  COMPREHENSIVE METABOLIC PANEL - Abnormal; Notable for the following:    Sodium 133 (*)    Chloride 92 (*)    Glucose, Bld 617 (*)    BUN 65 (*)    Creatinine, Ser 2.64 (*)    Total Protein 8.9 (*)    GFR calc non Af Amer 17 (*)    GFR calc Af Amer 20 (*)    All other components within normal limits  TROPONIN I - Abnormal; Notable for the following:    Troponin I 0.03 (*)    All other components within normal limits  BRAIN NATRIURETIC PEPTIDE - Abnormal; Notable for the following:    B Natriuretic Peptide 393.0 (*)    All other components within normal limits  BLOOD GAS, ARTERIAL - Abnormal; Notable for the following:    pH, Arterial 7.295 (*)    pCO2 arterial 59.9 (*)    pO2, Arterial 303 (*)    Acid-Base Excess 2.3 (*)    Allens test (pass/fail) NOT INDICATED (*)    All other components within normal limits  I-STAT CG4 LACTIC ACID, ED    EKG  EKG Interpretation  Date/Time:  Saturday Jul 09 2016 03:16:01 EDT Ventricular Rate:  78 PR Interval:    QRS Duration: 119 QT Interval:  400 QTC Calculation: 456 R Axis:   11 Text Interpretation:  Sinus rhythm Probable left atrial enlargement Incomplete left bundle branch block Borderline low voltage, extremity leads Left ventricular hypertrophy Baseline wander in lead(s) V2 No significant change since last tracing Confirmed by Teaghan Formica  MD, Casidy [place] [phone]) on 07/09/2016 3:18:00 AM       Radiology Dg Chest  Port 1 View  Result Date: 07/09/2016 CLINICAL DATA:  Dyspnea tonight EXAM: PORTABLE CHEST 1 VIEW COMPARISON:  04/01/2016 FINDINGS: Central and basilar airspace opacity bilaterally, right greater than left. In the right base there are air bronchograms. This may represent pneumonia. No large effusion. IMPRESSION: Dense consolidation in the right base with more patchy consolidation elsewhere in the central and basilar lungs bilaterally. This may represent pneumonia. Followup PA and lateral chest X-ray is recommended in 3-4 weeks following trial of antibiotic therapy to ensure resolution and exclude underlying malignancy. Electronically Signed   By: Andreas Newport M.D.   On: 07/09/2016 04:38    Procedures Procedures (including critical care time)  Medications Ordered in ED Medications  dextrose 5 %-0.45 % sodium chloride infusion (not administered)  insulin regular (NOVOLIN R,HUMULIN R) 250 Units in sodium chloride 0.9 % 250 mL (1 Units/mL) infusion (not administered)  0.9 %  sodium chloride infusion ( Intravenous New Bag/Given 07/09/16 0458)  cefTRIAXone (ROCEPHIN) 1 g in dextrose 5 % 50 mL IVPB (not administered)  azithromycin (ZITHROMAX) 500 mg in dextrose 5 % 250 mL IVPB (not administered)  nitroGLYCERIN 50 mg in dextrose 5 % 250 mL (0.2 mg/mL) infusion (5 mcg/min Intravenous New Bag/Given 07/09/16 0400)  furosemide (LASIX) injection 80 mg (80 mg Intravenous Given 07/09/16 0442)  albuterol (PROVENTIL) (2.5 MG/3ML) 0.083% nebulizer solution (5 mg  Given 07/09/16 0450)     Initial Impression / Assessment and Plan / ED Course  I  have reviewed the triage vital signs and the nursing notes.  Pertinent labs & imaging results that were available during my care of the patient were reviewed by me and considered in my medical decision making (see chart for details).     Patient presents to the emergency department for evaluation of shortness of breath. Patient reports that she has been sick for at least a  week. She thinks she has bronchitis. She has had similar illnesses in the past. Patient also, however, has chronic renal failure and congestive heart failure. Examination was concerning for pulmonary edema. Chest x-ray appears to be mixed. There is cephalization and bilateral opacities that her probably fluid, but she does have a dense opacity in the right lung base that might be pneumonia. Patient was very hypertensive at arrival. She was administered IV nitroglycerin for hypertensive urgency with suspected congestive heart failure. She was given IV Lasix. She does not have an elevated white count. No fever. Lactate is normal. No evidence currently of sepsis. Will treat empirically with Rocephin and Zithromax based on x-ray findings.  CRITICAL CARE Performed by: Orpah Greek   Total critical care time: 35 minutes  Critical care time was exclusive of separately billable procedures and treating other patients.  Critical care was necessary to treat or prevent imminent or life-threatening deterioration.  Critical care was time spent personally by me on the following activities: development of treatment plan with patient and/or surrogate as well as nursing, discussions with consultants, evaluation of patient's response to treatment, examination of patient, obtaining history from patient or surrogate, ordering and performing treatments and interventions, ordering and review of laboratory studies, ordering and review of radiographic studies, pulse oximetry and re-evaluation of patient's condition.   Final Clinical Impressions(s) / ED Diagnoses   Final diagnoses:  Hypertensive urgency  Acute respiratory failure with hypoxia ([place])  Community acquired pneumonia of right lower lobe of lung ([place])  Acute on chronic congestive heart failure, unspecified heart failure type [facility])    New Prescriptions New Prescriptions   No medications on file     Orpah Greek, MD 07/09/16 0501

## 2016-07-09 NOTE — Progress Notes (Signed)
CRITICAL VALUE ALERT  Critical value received:  Troponin 0.72  Date of notification:  07/09/16  Time of notification: 1947  Critical value read back:  Nurse who received alert:  E.Dennie Vecchio RN  MD notified (1st page):  Dr. Cherly Hensen  Time of first page:  [phone]  MD notified (2nd page):  Time of second page:  Responding MD:  Dr. Jerilee Hoh    Time MD responded:  [phone]

## 2016-07-09 NOTE — ED Notes (Signed)
530 round

## 2016-07-09 NOTE — H&P (Addendum)
TRH H&P    Patient Demographics:    Jeanifer Halliday, is a 71 y.o. female  MRN: [id number]  DOB - 09-01-1945  Admit Date - 07/09/2016  Referring MD/NP/PA: Malachy Moan  Outpatient Primary MD for the patient is [name], MD  Patient coming from: Home  Chief Complaint  Patient presents with  . Shortness of Breath      HPI:    Jersee Winiarski  is a 71 y.o. female, With history of CHF, chronic kidney disease stage III, diabetes mellitus, hypertension was brought to  ED for shortness of breath. Patient says that she has been sick for a week and has been having cough, nasal drainage. Patient says that she has been having difficulty sleeping because of cough. Patient had any chest pain. No nausea vomiting or diarrhea.  In the ED, patient was found to be hypertensive with blood pressure 217/118, blood glucose 617, ABG showed pH 7.295, PCO2 59.9. Patient started on glucose stabilizer with IV insulin. Chest x-ray showed dense consolidation in the right lower lobe. Patient empirically started on ceftriaxone and zithromax for pneumonia. Patient started on BiPAP also received 80 mg IV Lasix.  She denies dysuria, no abdominal pain. No fever.    Review of systems:      A full 10 point Review of Systems was done, except as stated above, all other Review of Systems were negative.   With Past History of the following :    Past Medical History:  Diagnosis Date  . Acute CHF (congestive heart failure) ([place]) 07/04/2015  . Acute renal failure superimposed on stage 4 chronic kidney disease ([place]) 07/04/2015  . Allergic rhinitis   . Anxiety   . Constipation   . Diabetes mellitus   . GERD (gastroesophageal reflux disease)   . Heart murmur, systolic   . History of hysterectomy   . Hyperlipidemia   . Hypertension   . Low back pain   . Obesity   . Osteoarthritis   . Overactive bladder       Past Surgical  History:  Procedure Laterality Date  . ABDOMINAL HYSTERECTOMY    . BACK SURGERY     multiple  . CARDIAC CATHETERIZATION  04/04/2006   Est EF of 60%  . CARDIAC CATHETERIZATION N/A 07/04/2015   Procedure: Left Heart Cath and Coronary Angiography;  Surgeon: Troy Sine, MD;  Location: [facility];  Service: Cardiovascular;  Laterality: N/A;  . CARDIAC CATHETERIZATION N/A 07/04/2015   Procedure: Coronary Stent Intervention;  Surgeon: Troy Sine, MD;  Location: [facility];  Service: Cardiovascular;  Laterality: N/A;  . CARDIAC CATHETERIZATION N/A 10/13/2015   Procedure: Left Heart Cath and Coronary Angiography;  Surgeon: Troy Sine, MD;  Location: [facility];  Service: Cardiovascular;  Laterality: N/A;  . CARDIAC CATHETERIZATION N/A 12/02/2015   Procedure: Left Heart Cath and Coronary Angiography;  Surgeon: Peter M Martinique, MD;  Location: [facility];  Service: Cardiovascular;  Laterality: N/A;  . CARPAL TUNNEL RELEASE    . CERVICAL BIOPSY  cervical lymph node biopsies  . COLONOSCOPY  May 2002   Dr. Irving Shows :Followup in 5 years, normal exam  . COLONOSCOPY  2008   Dr. Laural Golden: Very redundant colon with mild melanosis coli, splenic flexure polyp biopsy with acute complaint of benign colon polyp. Recommended ten-year followup  . CORONARY STENT PLACEMENT  04/11/2006   2 -- Taxus stents to the circumflex   . PERIPHERAL VASCULAR CATHETERIZATION Right 07/09/2015   Procedure: Upper Extremity Angiography;  Surgeon: Conrad Danville, MD;  Location: [facility];  Service: Cardiovascular;  Laterality: Right;  . PERIPHERAL VASCULAR CATHETERIZATION Right 07/10/2015   Procedure: RIGHT SUBCLAVIAN ARTERY THROMBECTOMY;  Surgeon: Serafina Mitchell, MD;  Location: [facility];  Service: Vascular;  Laterality: Right;  . tendonitis     bilateral elbow  . TRIGGER FINGER RELEASE        Social History:      Social History  Substance Use Topics  . Smoking status: Never Smoker   . Smokeless tobacco: Never Used  . Alcohol use No       Family History :     Family History  Problem Relation Age of Onset  . Diabetes Father   . Hypertension Father   . Stroke Father   . Hypertension Brother   . Aneurysm Brother   . Diabetes Brother   . Colon cancer Neg Hx       Home Medications:   Prior to Admission medications   Medication Sig Start Date End Date Taking? Authorizing Provider  ACCU-CHEK AVIVA PLUS test strip USE TO CHECK BLOOD GLUCOSE 4 TIMES A DAY 07/04/16   Nida, Marella Chimes, MD  acetaminophen (TYLENOL) 500 MG tablet Take 2 tablets (1,000 mg total) by mouth every 6 (six) hours as needed (pain). 07/12/15   Lily Kocher, MD  albuterol (PROVENTIL HFA;VENTOLIN HFA) [phone] Base) MCG/ACT inhaler Inhale 1-2 puffs into the lungs every 6 (six) hours as needed for wheezing or shortness of breath. 04/01/16   Varney Biles, MD  ALPRAZolam Duanne Moron) 0.5 MG tablet Take 0.5 mg by mouth as needed for anxiety or sleep.     [provider]  aspirin EC 81 MG tablet Take 81 mg by mouth daily.    [provider]  calcitRIOL (ROCALTROL) 0.25 MCG capsule Take 0.25 mcg by mouth every Monday, Wednesday, and Friday.  03/23/15   [provider]  famotidine (PEPCID) 20 MG tablet Take 20 mg by mouth daily.    [provider]  ferrous sulfate 325 (65 FE) MG tablet Take 325 mg by mouth daily.    [provider]  hydrALAZINE (APRESOLINE) 25 MG tablet Take 2 tablets (50 mg total) by mouth 3 (three) times daily. 01/21/16   Herminio Commons, MD  HYDROcodone-homatropine (HYCODAN) 5-1.5 MG/5ML syrup as needed. 12/08/15   [provider]  ibuprofen (ADVIL,MOTRIN) 800 MG tablet Take 1 tablet (800 mg total) by mouth 3 (three) times daily. 03/20/16   Noemi Chapel, MD  insulin degludec (TRESIBA) 100 UNIT/ML SOPN FlexTouch Pen Inject 40 Units into the skin daily at 10 pm.    [provider]  insulin lispro (HUMALOG KWIKPEN) 100  UNIT/ML KiwkPen Inject 0.15-0.21 mLs (15-21 Units total) into the skin 3 (three) times daily. 03/22/16   Cassandria Anger, MD  isosorbide mononitrate (IMDUR) 60 MG 24 hr tablet Take 1 tablet (60 mg total) by mouth daily. 07/12/15   Lily Kocher, MD  latanoprost (XALATAN) 0.005 % ophthalmic solution Place 1 drop  into both eyes at bedtime.  10/25/11   [provider]  meclizine (ANTIVERT) 25 MG tablet Take 1 tablet by mouth daily. 03/06/16   [provider]  metolazone (ZAROXOLYN) 2.5 MG tablet Take 1 tablet (2.5 mg total) by mouth daily. 01/21/16 01/23/16  Herminio Commons, MD  metoprolol succinate (TOPROL-XL) 50 MG 24 hr tablet TAKE ONE TABLET BY MOUTH TWICE DAILY WITH  OR  IMMEDIATELY  FOLLOWING  A  MEAL 04/28/16   Lyda Jester M, PA-C  nitroGLYCERIN (NITROSTAT) 0.4 MG SL tablet Place 1 tablet (0.4 mg total) under the tongue every 5 (five) minutes as needed for chest pain. 10/14/15   Lyda Jester M, PA-C  potassium chloride SA (K-DUR,KLOR-CON) 20 MEQ tablet Take 2 tablets (40 mEq total) by mouth daily. 10/14/15   Rosita Fire, Brittainy M, PA-C  RANEXA 500 MG 12 hr tablet TAKE ONE TABLET BY MOUTH TWICE DAILY 05/04/16   Troy Sine, MD  rosuvastatin (CRESTOR) 40 MG tablet Take 1 tablet (40 mg total) by mouth at bedtime. 07/12/15   Lily Kocher, MD  ticagrelor (BRILINTA) 90 MG TABS tablet Take 1 tablet (90 mg total) by mouth 2 (two) times daily. 10/14/15   Lyda Jester M, PA-C  torsemide (DEMADEX) 20 MG tablet Take 3 tabs (60mg ) by mouth every morning & 1 tab (20mg ) mid day, can take an extra 20mg  daily if her weight increases by >3 pounds. 12/03/15   Arbutus Leas, NP  trimethoprim (TRIMPEX) 100 MG tablet Take 100 mg by mouth daily.  12/06/11   [provider]     Allergies:     Allergies  Allergen Reactions  . Ace Inhibitors Cough    Pt can tolerate Tribenzor (and ARBs)  . Codeine Rash     Physical Exam:   Vitals  Blood pressure (!) 166/99, pulse  86, temperature 98.5 F (36.9 C), temperature source Oral, resp. rate (!) 27, height 5\' 11"  (1.803 m), weight 83.9 kg (185 lb), SpO2 100 %.  1.  General: Currently on BiPAP  2. Psychiatric:  Intact judgement and  insight, awake alert, oriented x 3.  3. Neurologic: No focal neurological deficits, all cranial nerves intact.Strength 5/5 all 4 extremities, sensation intact all 4 extremities, plantars down going.  4. Eyes :  anicteric sclerae, moist conjunctivae with no lid lag. PERRLA.  5. ENMT:  Oropharynx clear with moist mucous membranes and good dentition  6. Neck:  supple, no cervical lymphadenopathy appriciated, No thyromegaly  7. Respiratory : Normal respiratory effort, bibasilar crackles  8. Cardiovascular : RRR, no gallops, rubs or murmurs, no leg edema  9. Gastrointestinal:  Positive bowel sounds, abdomen soft, non-tender to palpation,no hepatosplenomegaly, no rigidity or guarding       10. Skin:  No cyanosis, normal texture and turgor, no rash, lesions or ulcers  11.Musculoskeletal:  Good muscle tone,  joints appear normal , no effusions,  normal range of motion    Data Review:    CBC  Recent Labs Lab 07/09/16 0402  WBC 7.8  HGB 12.6  HCT 39.7  PLT 201  MCV 103.1*  MCH 32.7  MCHC 31.7  RDW 14.9  LYMPHSABS 2.1  MONOABS 0.3  EOSABS 0.1  BASOSABS 0.1   ------------------------------------------------------------------------------------------------------------------  Chemistries   Recent Labs Lab 07/09/16 0402  NA 133*  K 4.6  CL 92*  CO2 28  GLUCOSE 617*  BUN 65*  CREATININE 2.64*  CALCIUM 10.2  AST 39  ALT 31  ALKPHOS 102  BILITOT  0.8   ------------------------------------------------------------------------------------------------------------------  ------------------------------------------------------------------------------------------------------------------ GFR: Estimated Creatinine Clearance: 21.8 mL/min (A) (by C-G formula  based on SCr of 2.64 mg/dL (H)). Liver Function Tests:  Recent Labs Lab 07/09/16 0402  AST 39  ALT 31  ALKPHOS 102  BILITOT 0.8  PROT 8.9*  ALBUMIN 4.4   No results for input(s): LIPASE, AMYLASE in the last 168 hours. No results for input(s): AMMONIA in the last 168 hours. Coagulation Profile: No results for input(s): INR, PROTIME in the last 168 hours. Cardiac Enzymes:  Recent Labs Lab 07/09/16 0402  TROPONINI 0.03*   BNP (last 3 results) No results for input(s): PROBNP in the last 8760 hours. HbA1C: No results for input(s): HGBA1C in the last 72 hours. CBG:  Recent Labs Lab 07/09/16 0544  GLUCAP 597*    --------------------------------------------------------------------------------------------------------------- Urine analysis:    Component Value Date/Time   COLORURINE YELLOW 03/20/2016 1500   APPEARANCEUR CLEAR 03/20/2016 1500   LABSPEC 1.011 03/20/2016 1500   PHURINE 6.0 03/20/2016 1500   GLUCOSEU 150 (A) 03/20/2016 1500   HGBUR NEGATIVE 03/20/2016 1500   BILIRUBINUR NEGATIVE 03/20/2016 1500   KETONESUR NEGATIVE 03/20/2016 1500   PROTEINUR 30 (A) 03/20/2016 1500   UROBILINOGEN 0.2 08/23/2007 2037   NITRITE NEGATIVE 03/20/2016 1500   LEUKOCYTESUR NEGATIVE 03/20/2016 1500      Imaging Results:    Dg Chest Port 1 View  Result Date: 07/09/2016 CLINICAL DATA:  Dyspnea tonight EXAM: PORTABLE CHEST 1 VIEW COMPARISON:  04/01/2016 FINDINGS: Central and basilar airspace opacity bilaterally, right greater than left. In the right base there are air bronchograms. This may represent pneumonia. No large effusion. IMPRESSION: Dense consolidation in the right base with more patchy consolidation elsewhere in the central and basilar lungs bilaterally. This may represent pneumonia. Followup PA and lateral chest X-ray is recommended in 3-4 weeks following trial of antibiotic therapy to ensure resolution and exclude underlying malignancy. Electronically Signed   By: Andreas Newport M.D.   On: 07/09/2016 04:38    My personal review of EKG: Rhythm NSR   Assessment & Plan:    Active Problems:   Essential hypertension   Type 2 diabetes mellitus with stage 4 chronic kidney disease (HCC)   Acute on chronic combined systolic and diastolic CHF (congestive heart failure) (HCC)   Acute respiratory failure with hypercapnia (HCC)   Acute respiratory failure with hypoxia ([place])   1. Acute respiratory failure with hypoxia and hypercapnia- multifactorial, she has right lung base infiltrate started on ceftriaxone and Zithromax for pneumonia. Also patient has history of combined systolic and diastolic CHF, takes torsemide at home. She received 80 mg Lasix in the ED. Will continue with Lasix 40 mg IV every 12 hours. Continue BiPAP. Patient will be admitted to stepdown unit. 2. Community acquired pneumonia- chest x-ray showed infiltrate in right lower lobe, started on ceftriaxone and Zithromax as above. Follow blood culture results 3. Diabetes mellitus with hyperglycemia- patient came with blood glucose 617, started him glucose stabilizer. Tinnitus which back to sliding scale once blood glucose improves. 4. Hypertension-blood pressure improved after she was started on nitroglycerin infusion, will continue home medications including hydralazine, Imdur, Toprol-XL 50 mg by mouth twice a day 5. Combined systolic and diastolic CHF- patient's last echo showed EF 40-45% with grade 2 diastolic dysfunction. We'll hold torsemide at this time, started on Lasix 40 mg IV every 12 hours. 6. CAD status post stent placement-continue aspirin and Alimta, metoprolol, Imdur, Ranexa, Crestor. Troponin in the ED 0.03, will cycle troponin every  6 hours 3 7. Chronic kidney disease stage IV- creatinine 2.64, at baseline. Follow BMP in a.m.    DVT Prophylaxis-   Lovenox   AM Labs Ordered, also please review Full Orders  Family Communication: Admission, patients condition and plan of care including  tests being ordered have been discussed with the patient and her daughter at bedside who indicate understanding and agree with the plan and Code Status.  Code Status:  Full code  Admission status: Inpatient    Time spent in minutes : 60 minutes   Ayaat Jansma S M.D on 07/09/2016 at 6:04 AM  Between 7am to 7pm - Pager - [phone]. After 7pm go to [email or web address] - password [facility]  [facility] - Office  [phone]

## 2016-07-09 NOTE — ED Triage Notes (Signed)
Pt c/o sob since yesterday, states she has had a cough, but denies cp

## 2016-07-09 NOTE — ED Notes (Addendum)
Date and time results received: 07/09/16 0456 (use smartphrase ".now" to insert current time)  Test: Troponin Critical Value: 0.03  Name of Provider Notified: Dr. Betsey Holiday  Orders Received? Or Actions Taken?: Md Notified

## 2016-07-09 NOTE — ED Notes (Signed)
Date and time results received: 07/09/16 0458 (use smartphrase ".now" to insert current time)  Test: Glucose Critical Value: 617  Name of Provider Notified: Dr. Betsey Holiday  Orders Received? Or Actions Taken?: MD notified

## 2016-07-09 NOTE — Progress Notes (Signed)
Pharmacy Antibiotic Note  ZO LOUDON is a 71 y.o. female admitted on 07/09/2016 with CAP.  Pharmacy has been consulted for ceftriaxone dosing.  Plan: Ceftriaxone 1gm IV q24h Also on Azithromycin 500mg  IV q24h F/U cxs and clinical progress  Height: 5\' 11"  (180.3 cm) Weight: 185 lb (83.9 kg) IBW/kg (Calculated) : 70.8  Temp (24hrs), Avg:98.5 F (36.9 C), Min:98.5 F (36.9 C), Max:98.5 F (36.9 C)   Recent Labs Lab 07/09/16 0402 07/09/16 0421  WBC 7.8  --   CREATININE 2.64*  --   LATICACIDVEN  --  1.90    Estimated Creatinine Clearance: 21.8 mL/min (A) (by C-G formula based on SCr of 2.64 mg/dL (H)).    Allergies  Allergen Reactions  . Ace Inhibitors Cough    Pt can tolerate Tribenzor (and ARBs)  . Codeine Rash    Antimicrobials this admission: Ceftriaxone 5/5 >>  Azithromycin 5/5 >>   Thank you for allowing pharmacy to be a part of this patient's care.  Isac Sarna, BS Pharm D, BCPS Clinical Pharmacist Pager [phone] 07/09/2016 9:02 AM

## 2016-07-10 LAB — COMPREHENSIVE METABOLIC PANEL
ALBUMIN: 3.4 g/dL — AB (ref 3.5–5.0)
ALK PHOS: 68 U/L (ref 38–126)
ALT: 19 U/L (ref 14–54)
AST: 26 U/L (ref 15–41)
Anion gap: 7 (ref 5–15)
BILIRUBIN TOTAL: 0.5 mg/dL (ref 0.3–1.2)
BUN: 57 mg/dL — AB (ref 6–20)
CO2: 32 mmol/L (ref 22–32)
CREATININE: 2.36 mg/dL — AB (ref 0.44–1.00)
Calcium: 9.1 mg/dL (ref 8.9–10.3)
Chloride: 96 mmol/L — ABNORMAL LOW (ref 101–111)
GFR calc Af Amer: 23 mL/min — ABNORMAL LOW (ref >60)
GFR, EST NON AFRICAN AMERICAN: 20 mL/min — AB (ref >60)
GLUCOSE: 252 mg/dL — AB (ref 65–99)
POTASSIUM: 3.5 mmol/L (ref 3.5–5.1)
Sodium: 135 mmol/L (ref 135–145)
TOTAL PROTEIN: 6.3 g/dL — AB (ref 6.5–8.1)

## 2016-07-10 LAB — GLUCOSE, CAPILLARY
GLUCOSE-CAPILLARY: 164 mg/dL — AB (ref 65–99)
GLUCOSE-CAPILLARY: 338 mg/dL — AB (ref 65–99)
GLUCOSE-CAPILLARY: 254 mg/dL — AB (ref 65–99)
Glucose-Capillary: 212 mg/dL — ABNORMAL HIGH (ref 65–99)
Glucose-Capillary: 266 mg/dL — ABNORMAL HIGH (ref 65–99)

## 2016-07-10 LAB — CBC
HEMATOCRIT: 31.3 % — AB (ref 36.0–46.0)
HEMOGLOBIN: 10 g/dL — AB (ref 12.0–15.0)
MCH: 32.4 pg (ref 26.0–34.0)
MCHC: 31.9 g/dL (ref 30.0–36.0)
MCV: 101.3 fL — AB (ref 78.0–100.0)
Platelets: 168 10*3/uL (ref 150–400)
RBC: 3.09 MIL/uL — AB (ref 3.87–5.11)
RDW: 14.6 % (ref 11.5–15.5)
WBC: 6.2 10*3/uL (ref 4.0–10.5)

## 2016-07-10 LAB — MRSA PCR SCREENING: MRSA BY PCR: NEGATIVE

## 2016-07-10 MED ORDER — IPRATROPIUM-ALBUTEROL 0.5-2.5 (3) MG/3ML IN SOLN
3.0000 mL | Freq: Three times a day (TID) | RESPIRATORY_TRACT | Status: DC
  Administered 2016-07-11 – 2016-07-12 (×4): 3 mL via RESPIRATORY_TRACT
  Filled 2016-07-10 (×4): qty 3

## 2016-07-10 MED ORDER — INSULIN GLARGINE 100 UNIT/ML ~~LOC~~ SOLN
SUBCUTANEOUS | Status: AC
  Filled 2016-07-10: qty 10

## 2016-07-10 MED ORDER — LATANOPROST 0.005 % OP SOLN
OPHTHALMIC | Status: AC
  Filled 2016-07-10: qty 2.5

## 2016-07-10 MED ORDER — TICAGRELOR 90 MG PO TABS
ORAL_TABLET | ORAL | Status: AC
  Filled 2016-07-10: qty 1

## 2016-07-10 MED ORDER — FUROSEMIDE 40 MG PO TABS
40.0000 mg | ORAL_TABLET | Freq: Every day | ORAL | Status: DC
  Administered 2016-07-10 – 2016-07-12 (×3): 40 mg via ORAL
  Filled 2016-07-10 (×3): qty 1

## 2016-07-10 NOTE — Progress Notes (Signed)
PROGRESS NOTE    EPIPHANY SELTZER  XFG:[id number] DOB: 04-20-1945 DOA: 07/09/2016 PCP: [name], MD     Brief Narrative:  71 y/o woman admitted from home on 5/5 due to SOB.   Assessment & Plan:   Active Problems:   Essential hypertension   Type 2 diabetes mellitus with stage 4 chronic kidney disease (HCC)   Acute on chronic combined systolic and diastolic CHF (congestive heart failure) (HCC)   Acute respiratory failure with hypercapnia (HCC)   Acute respiratory failure with hypoxia (HCC)   Acute Hypercapnic/Hypoxic Respiratory Failure -Required NIPPV transiently. -Has been weaned to [place].  CAP -Rocephin/azithro. -Cx data pending.  Uncontrolled DM with marked Hyperglycemia -Was on glucostabilizer on admission, not DKA. -Has been transitioned to SSI. -Continue to adjust regimen as deemed necessary.  Acute Combined CHF -Volume status improved. -Transitioned to PO lasix. -No ACE-I due to advanced CKD. -On metoprolol. -ECHO pending. -Is and Os do not appear to be adequately documented.  Demand Ischemia -Trop peaked 1.7. Believed due to acute respiratory illness. -No CP, no acute ischemic changes on EKG. -Continue to cycle troponins. -Check ECHO.  Stage IV CKD -Cr at baseline of around 2.64.   DVT prophylaxis: lovenox Code Status: full code Family Communication: multiple family members at bedside updated on plan of care and all questions answered Disposition Plan: Transfer to floor. Anticipate DC home in 48-72 hours  Consultants:   None  Procedures:   None  Antimicrobials:  Anti-infectives    Start     Dose/Rate Route Frequency Ordered Stop   07/10/16 0600  azithromycin (ZITHROMAX) 500 mg in dextrose 5 % 250 mL IVPB     500 mg 250 mL/hr over 60 Minutes Intravenous Every 24 hours 07/09/16 0854     07/10/16 0600  cefTRIAXone (ROCEPHIN) 1 g in dextrose 5 % 50 mL IVPB     1 g 100 mL/hr over 30 Minutes Intravenous Every 24 hours 07/09/16 0910       07/09/16 0500  cefTRIAXone (ROCEPHIN) 1 g in dextrose 5 % 50 mL IVPB     1 g 100 mL/hr over 30 Minutes Intravenous  Once 07/09/16 0458 07/09/16 0619   07/09/16 0500  azithromycin (ZITHROMAX) 500 mg in dextrose 5 % 250 mL IVPB     500 mg 250 mL/hr over 60 Minutes Intravenous  Once 07/09/16 0458 07/09/16 0720       Subjective: Feels MUCH improved, less SOB, no CP.  Objective: Vitals:   07/10/16 1200 07/10/16 1300 07/10/16 1400 07/10/16 1434  BP: 131/62 127/73 123/61   Pulse: 61 67 (!) 59   Resp: 20     Temp:      TempSrc:      SpO2: 100% 100% 99% 100%  Weight:      Height:        Intake/Output Summary (Last 24 hours) at 07/10/16 1507 Last data filed at 07/10/16 0506  Gross per 24 hour  Intake            976.5 ml  Output                0 ml  Net            976.5 ml   Filed Weights   07/09/16 0320 07/10/16 0500  Weight: 83.9 kg (185 lb) 90.5 kg (199 lb 8.3 oz)    Examination:  General exam: Alert, awake, oriented x 3 Respiratory system: Clear to auscultation. Respiratory effort normal. Cardiovascular system:RRR. No murmurs,  rubs, gallops. Gastrointestinal system: Abdomen is nondistended, soft and nontender. No organomegaly or masses felt. Normal bowel sounds heard. Central nervous system: Alert and oriented. No focal neurological deficits. Extremities: No C/C/E, +pedal pulses Skin: No rashes, lesions or ulcers Psychiatry: Judgement and insight appear normal. Mood & affect appropriate.     Data Reviewed: I have personally reviewed following labs and imaging studies  CBC:  Recent Labs Lab 07/09/16 0402 07/10/16 0521  WBC 7.8 6.2  NEUTROABS 5.2  --   HGB 12.6 10.0*  HCT 39.7 31.3*  MCV 103.1* 101.3*  PLT 201 497   Basic Metabolic Panel:  Recent Labs Lab 07/09/16 0402 07/09/16 1440 07/10/16 0521  NA 133* 138 135  K 4.6 3.5 3.5  CL 92* 96* 96*  CO2 28 30 32  GLUCOSE 617* 173* 252*  BUN 65* 63* 57*  CREATININE 2.64* 2.60* 2.36*  CALCIUM 10.2  9.8 9.1   GFR: Estimated Creatinine Clearance: 27.2 mL/min (A) (by C-G formula based on SCr of 2.36 mg/dL (H)). Liver Function Tests:  Recent Labs Lab 07/09/16 0402 07/10/16 0521  AST 39 26  ALT 31 19  ALKPHOS 102 68  BILITOT 0.8 0.5  PROT 8.9* 6.3*  ALBUMIN 4.4 3.4*   No results for input(s): LIPASE, AMYLASE in the last 168 hours. No results for input(s): AMMONIA in the last 168 hours. Coagulation Profile: No results for input(s): INR, PROTIME in the last 168 hours. Cardiac Enzymes:  Recent Labs Lab 07/09/16 0402 07/09/16 1022 07/09/16 1440 07/09/16 2045  TROPONINI 0.03* 0.24* 0.72* 0.61*   BNP (last 3 results) No results for input(s): PROBNP in the last 8760 hours. HbA1C: No results for input(s): HGBA1C in the last 72 hours. CBG:  Recent Labs Lab 07/09/16 2046 07/09/16 2155 07/10/16 0345 07/10/16 0757 07/10/16 1130  GLUCAP 162* 190* 164* 212* 338*   Lipid Profile: No results for input(s): CHOL, HDL, LDLCALC, TRIG, CHOLHDL, LDLDIRECT in the last 72 hours. Thyroid Function Tests: No results for input(s): TSH, T4TOTAL, FREET4, T3FREE, THYROIDAB in the last 72 hours. Anemia Panel: No results for input(s): VITAMINB12, FOLATE, FERRITIN, TIBC, IRON, RETICCTPCT in the last 72 hours. Urine analysis:    Component Value Date/Time   COLORURINE YELLOW 03/20/2016 1500   APPEARANCEUR CLEAR 03/20/2016 1500   LABSPEC 1.011 03/20/2016 1500   PHURINE 6.0 03/20/2016 1500   GLUCOSEU 150 (A) 03/20/2016 1500   HGBUR NEGATIVE 03/20/2016 1500   BILIRUBINUR NEGATIVE 03/20/2016 1500   KETONESUR NEGATIVE 03/20/2016 1500   PROTEINUR 30 (A) 03/20/2016 1500   UROBILINOGEN 0.2 08/23/2007 2037   NITRITE NEGATIVE 03/20/2016 1500   LEUKOCYTESUR NEGATIVE 03/20/2016 1500   Sepsis Labs: @LABRCNTIP (procalcitonin:4,lacticidven:4)  ) Recent Results (from the past 240 hour(s))  MRSA PCR Screening     Status: None   Collection Time: 07/09/16  8:45 AM  Result Value Ref Range Status    MRSA by PCR NEGATIVE NEGATIVE Final    Comment:        The GeneXpert MRSA Assay (FDA approved for NASAL specimens only), is one component of a comprehensive MRSA colonization surveillance program. It is not intended to diagnose MRSA infection nor to guide or monitor treatment for MRSA infections.   Culture, blood (Routine X 2) w Reflex to ID Panel     Status: None (Preliminary result)   Collection Time: 07/09/16 10:22 AM  Result Value Ref Range Status   Specimen Description BLOOD BLOOD LEFT FOREARM  Final   Special Requests   Final    BOTTLES  DRAWN AEROBIC AND ANAEROBIC Blood Culture results may not be optimal due to an inadequate volume of blood received in culture bottles   Culture PENDING  Incomplete   Report Status PENDING  Incomplete  Culture, blood (Routine X 2) w Reflex to ID Panel     Status: None (Preliminary result)   Collection Time: 07/09/16  2:47 PM  Result Value Ref Range Status   Specimen Description RIGHT ANTECUBITAL  Final   Special Requests   Final    BOTTLES DRAWN AEROBIC AND ANAEROBIC Blood Culture adequate volume   Culture PENDING  Incomplete   Report Status PENDING  Incomplete         Radiology Studies: Dg Chest Port 1 View  Result Date: 07/09/2016 CLINICAL DATA:  Dyspnea tonight EXAM: PORTABLE CHEST 1 VIEW COMPARISON:  04/01/2016 FINDINGS: Central and basilar airspace opacity bilaterally, right greater than left. In the right base there are air bronchograms. This may represent pneumonia. No large effusion. IMPRESSION: Dense consolidation in the right base with more patchy consolidation elsewhere in the central and basilar lungs bilaterally. This may represent pneumonia. Followup PA and lateral chest X-ray is recommended in 3-4 weeks following trial of antibiotic therapy to ensure resolution and exclude underlying malignancy. Electronically Signed   By: Andreas Newport M.D.   On: 07/09/2016 04:38        Scheduled Meds: . aspirin EC  81 mg Oral  Daily  . [START ON 07/11/2016] calcitRIOL  0.25 mcg Oral Q M,W,F  . enoxaparin (LOVENOX) injection  30 mg Subcutaneous Q24H  . famotidine  20 mg Oral Daily  . ferrous sulfate  325 mg Oral Q breakfast  . furosemide  40 mg Oral Daily  . guaiFENesin  600 mg Oral BID  . hydrALAZINE  50 mg Oral TID  . insulin aspart  0-5 Units Subcutaneous QHS  . insulin aspart  0-9 Units Subcutaneous TID WC  . insulin glargine  15 Units Subcutaneous QHS  . ipratropium-albuterol  3 mL Nebulization Q6H  . isosorbide mononitrate  60 mg Oral Daily  . latanoprost  1 drop Both Eyes QHS  . meclizine  25 mg Oral Daily  . metoprolol succinate  50 mg Oral BID  . ranolazine  500 mg Oral BID  . rosuvastatin  40 mg Oral QHS  . ticagrelor  90 mg Oral BID   Continuous Infusions: . sodium chloride 10 mL/hr at 07/10/16 0506  . azithromycin Stopped (07/10/16 0606)  . cefTRIAXone (ROCEPHIN)  IV Stopped (07/10/16 0646)     LOS: 1 day    Time spent: 25 minutes. Greater than 50% of this time was spent in direct contact with the patient coordinating care.     Lelon Frohlich, MD [facility] Pager [phone]  If 7PM-7AM, please contact night-coverage [email or web address] Password [email or web address] 07/10/2016, 3:07 PM   \

## 2016-07-10 NOTE — Progress Notes (Signed)
Patient alert and oriented. Vital signs are stable. Saline lock patent x2. Resting in bed watching tv. Denies pain. Family visiting at bedside. Report given to [place], Therapist, sports. Patient to be transferred to room 314 via wheelchair with nursing staff.

## 2016-07-11 ENCOUNTER — Inpatient Hospital Stay (HOSPITAL_COMMUNITY): Payer: BLUE CROSS/BLUE SHIELD

## 2016-07-11 DIAGNOSIS — R7989 Other specified abnormal findings of blood chemistry: Secondary | ICD-10-CM

## 2016-07-11 LAB — GLUCOSE, CAPILLARY
GLUCOSE-CAPILLARY: 273 mg/dL — AB (ref 65–99)
Glucose-Capillary: 216 mg/dL — ABNORMAL HIGH (ref 65–99)
Glucose-Capillary: 267 mg/dL — ABNORMAL HIGH (ref 65–99)
Glucose-Capillary: 197 mg/dL — ABNORMAL HIGH (ref 65–99)

## 2016-07-11 LAB — ECHOCARDIOGRAM COMPLETE
Height: 71 in
Weight: 3192.26 oz

## 2016-07-11 LAB — HEMOGLOBIN A1C
Hgb A1c MFr Bld: 10.3 % — ABNORMAL HIGH (ref 4.8–5.6)
MEAN PLASMA GLUCOSE: 249 mg/dL

## 2016-07-11 MED ORDER — DEXTROSE 5 % IV SOLN
INTRAVENOUS | Status: AC
  Filled 2016-07-11: qty 10

## 2016-07-11 MED ORDER — DEXTROSE 5 % IV SOLN
INTRAVENOUS | Status: AC
  Filled 2016-07-11: qty 500

## 2016-07-11 MED ORDER — INSULIN GLARGINE 100 UNIT/ML ~~LOC~~ SOLN
18.0000 [IU] | Freq: Every day | SUBCUTANEOUS | Status: DC
  Administered 2016-07-11: 18 [IU] via SUBCUTANEOUS
  Filled 2016-07-11 (×2): qty 0.18

## 2016-07-11 NOTE — Progress Notes (Signed)
PROGRESS NOTE    [name]  SHF:[id number] DOB: September 02, 1945 DOA: 07/09/2016 PCP: [name], MD     Brief Narrative:  71 y/o woman admitted from home on 5/5 due to SOB. Required NIPPV transiently.   Assessment & Plan:   Active Problems:   Essential hypertension   Type 2 diabetes mellitus with stage 4 chronic kidney disease (HCC)   Acute on chronic combined systolic and diastolic CHF (congestive heart failure) (HCC)   Acute respiratory failure with hypercapnia (HCC)   Acute respiratory failure with hypoxia (HCC)   Acute Hypercapnic/Hypoxic Respiratory Failure -Required NIPPV transiently. -Has been weaned to [place].  CAP -Rocephin/azithro. -Cx data pending.  Uncontrolled DM with marked Hyperglycemia -Was on glucostabilizer on admission, not DKA. -Has been transitioned to SSI. -Uncontrolled. Increase lantus to 18 units.  Acute Combined CHF -Volume status improved. -Transitioned to PO lasix. -No ACE-I due to advanced CKD. -On metoprolol. -ECHO: EF 45% and multiple WMA with grade 2 diastolic dysfunction (unchanged from 11/17). -Is and Os do not appear to be adequately documented.  Demand Ischemia -Trop peaked 1.7. Believed due to acute respiratory illness. -No CP, no acute ischemic changes on EKG. -Continue to cycle troponins. -Check ECHO.  Stage IV CKD -Cr at baseline of around 2.64.   DVT prophylaxis: lovenox Code Status: full code Family Communication: patient only  Disposition Plan: anticipate Dc home in 24 hours.  Consultants:   None  Procedures:   None  Antimicrobials:  Anti-infectives    Start     Dose/Rate Route Frequency Ordered Stop   07/10/16 0600  azithromycin (ZITHROMAX) 500 mg in dextrose 5 % 250 mL IVPB     500 mg 250 mL/hr over 60 Minutes Intravenous Every 24 hours 07/09/16 0854     07/10/16 0600  cefTRIAXone (ROCEPHIN) 1 g in dextrose 5 % 50 mL IVPB     1 g 100 mL/hr over 30 Minutes Intravenous Every 24 hours 07/09/16  0910     07/09/16 0500  cefTRIAXone (ROCEPHIN) 1 g in dextrose 5 % 50 mL IVPB     1 g 100 mL/hr over 30 Minutes Intravenous  Once 07/09/16 0458 07/09/16 0619   07/09/16 0500  azithromycin (ZITHROMAX) 500 mg in dextrose 5 % 250 mL IVPB     500 mg 250 mL/hr over 60 Minutes Intravenous  Once 07/09/16 0458 07/09/16 0720       Subjective: Feels improved.  Objective: Vitals:   07/11/16 0421 07/11/16 0743 07/11/16 1402 07/11/16 1431  BP: (!) 130/43   (!) 128/56  Pulse: 73   60  Resp: 18   18  Temp: 98.3 F (36.8 C)   98.5 F (36.9 C)  TempSrc: Oral   Oral  SpO2: 96% 98% 98% 100%  Weight:      Height:        Intake/Output Summary (Last 24 hours) at 07/11/16 1759 Last data filed at 07/11/16 1501  Gross per 24 hour  Intake          1586.03 ml  Output                0 ml  Net          1586.03 ml   Filed Weights   07/09/16 0320 07/10/16 0500  Weight: 83.9 kg (185 lb) 90.5 kg (199 lb 8.3 oz)    Examination:  General exam: Alert, awake, oriented x 3 Respiratory system: mild crackles Cardiovascular system:RRR. No murmurs, rubs, gallops. Gastrointestinal system: Abdomen is nondistended,  soft and nontender. No organomegaly or masses felt. Normal bowel sounds heard. Central nervous system: Alert and oriented. No focal neurological deficits. Extremities: No C/C/E, +pedal pulses Skin: No rashes, lesions or ulcers Psychiatry: Judgement and insight appear normal. Mood & affect appropriate.     Data Reviewed: I have personally reviewed following labs and imaging studies  CBC:  Recent Labs Lab 07/09/16 0402 07/10/16 0521  WBC 7.8 6.2  NEUTROABS 5.2  --   HGB 12.6 10.0*  HCT 39.7 31.3*  MCV 103.1* 101.3*  PLT 201 595   Basic Metabolic Panel:  Recent Labs Lab 07/09/16 0402 07/09/16 1440 07/10/16 0521  NA 133* 138 135  K 4.6 3.5 3.5  CL 92* 96* 96*  CO2 28 30 32  GLUCOSE 617* 173* 252*  BUN 65* 63* 57*  CREATININE 2.64* 2.60* 2.36*  CALCIUM 10.2 9.8 9.1    GFR: Estimated Creatinine Clearance: 27.2 mL/min (A) (by C-G formula based on SCr of 2.36 mg/dL (H)). Liver Function Tests:  Recent Labs Lab 07/09/16 0402 07/10/16 0521  AST 39 26  ALT 31 19  ALKPHOS 102 68  BILITOT 0.8 0.5  PROT 8.9* 6.3*  ALBUMIN 4.4 3.4*   No results for input(s): LIPASE, AMYLASE in the last 168 hours. No results for input(s): AMMONIA in the last 168 hours. Coagulation Profile: No results for input(s): INR, PROTIME in the last 168 hours. Cardiac Enzymes:  Recent Labs Lab 07/09/16 0402 07/09/16 1022 07/09/16 1440 07/09/16 2045  TROPONINI 0.03* 0.24* 0.72* 0.61*   BNP (last 3 results) No results for input(s): PROBNP in the last 8760 hours. HbA1C:  Recent Labs  07/09/16 1440  HGBA1C 10.3*   CBG:  Recent Labs Lab 07/10/16 1628 07/10/16 2204 07/11/16 0737 07/11/16 1112 07/11/16 1649  GLUCAP 254* 266* 216* 267* 273*   Lipid Profile: No results for input(s): CHOL, HDL, LDLCALC, TRIG, CHOLHDL, LDLDIRECT in the last 72 hours. Thyroid Function Tests: No results for input(s): TSH, T4TOTAL, FREET4, T3FREE, THYROIDAB in the last 72 hours. Anemia Panel: No results for input(s): VITAMINB12, FOLATE, FERRITIN, TIBC, IRON, RETICCTPCT in the last 72 hours. Urine analysis:    Component Value Date/Time   COLORURINE YELLOW 03/20/2016 1500   APPEARANCEUR CLEAR 03/20/2016 1500   LABSPEC 1.011 03/20/2016 1500   PHURINE 6.0 03/20/2016 1500   GLUCOSEU 150 (A) 03/20/2016 1500   HGBUR NEGATIVE 03/20/2016 1500   BILIRUBINUR NEGATIVE 03/20/2016 1500   KETONESUR NEGATIVE 03/20/2016 1500   PROTEINUR 30 (A) 03/20/2016 1500   UROBILINOGEN 0.2 08/23/2007 2037   NITRITE NEGATIVE 03/20/2016 1500   LEUKOCYTESUR NEGATIVE 03/20/2016 1500   Sepsis Labs: @LABRCNTIP (procalcitonin:4,lacticidven:4)  ) Recent Results (from the past 240 hour(s))  MRSA PCR Screening     Status: None   Collection Time: 07/09/16  8:45 AM  Result Value Ref Range Status   MRSA by PCR  NEGATIVE NEGATIVE Final    Comment:        The GeneXpert MRSA Assay (FDA approved for NASAL specimens only), is one component of a comprehensive MRSA colonization surveillance program. It is not intended to diagnose MRSA infection nor to guide or monitor treatment for MRSA infections.   Culture, blood (Routine X 2) w Reflex to ID Panel     Status: None (Preliminary result)   Collection Time: 07/09/16 10:22 AM  Result Value Ref Range Status   Specimen Description BLOOD BLOOD LEFT FOREARM  Final   Special Requests   Final    BOTTLES DRAWN AEROBIC AND ANAEROBIC Blood Culture  results may not be optimal due to an inadequate volume of blood received in culture bottles   Culture NO GROWTH 2 DAYS  Final   Report Status PENDING  Incomplete  Culture, blood (Routine X 2) w Reflex to ID Panel     Status: None (Preliminary result)   Collection Time: 07/09/16  2:47 PM  Result Value Ref Range Status   Specimen Description RIGHT ANTECUBITAL  Final   Special Requests   Final    BOTTLES DRAWN AEROBIC AND ANAEROBIC Blood Culture adequate volume   Culture NO GROWTH 2 DAYS  Final   Report Status PENDING  Incomplete         Radiology Studies: No results found.      Scheduled Meds: . aspirin EC  81 mg Oral Daily  . calcitRIOL  0.25 mcg Oral Q M,W,F  . enoxaparin (LOVENOX) injection  30 mg Subcutaneous Q24H  . famotidine  20 mg Oral Daily  . ferrous sulfate  325 mg Oral Q breakfast  . furosemide  40 mg Oral Daily  . guaiFENesin  600 mg Oral BID  . hydrALAZINE  50 mg Oral TID  . insulin aspart  0-5 Units Subcutaneous QHS  . insulin aspart  0-9 Units Subcutaneous TID WC  . insulin glargine  15 Units Subcutaneous QHS  . ipratropium-albuterol  3 mL Nebulization TID  . isosorbide mononitrate  60 mg Oral Daily  . latanoprost  1 drop Both Eyes QHS  . meclizine  25 mg Oral Daily  . metoprolol succinate  50 mg Oral BID  . ranolazine  500 mg Oral BID  . rosuvastatin  40 mg Oral QHS  .  ticagrelor  90 mg Oral BID   Continuous Infusions: . sodium chloride 10 mL/hr at 07/11/16 0532  . azithromycin 500 mg (07/11/16 0530)  . cefTRIAXone (ROCEPHIN)  IV 1 g (07/11/16 0531)     LOS: 2 days    Time spent: 25 minutes. Greater than 50% of this time was spent in direct contact with the patient coordinating care.     Lelon Frohlich, MD [facility] Pager [phone]  If 7PM-7AM, please contact night-coverage [email or web address] Password [email or web address] 07/11/2016, 5:59 PM   \

## 2016-07-11 NOTE — Progress Notes (Signed)
Pt ambulated in halls. Pt tolerated activity without distress No SOB O2 saturation on room air 99%

## 2016-07-11 NOTE — Progress Notes (Addendum)
Inpatient Diabetes Program Recommendations  AACE/ADA: New Consensus Statement on Inpatient Glycemic Control (2015)  Target Ranges:  Prepandial:   less than 140 mg/dL      Peak postprandial:   less than 180 mg/dL (1-2 hours)      Critically ill patients:  140 - 180 mg/dL  Results for [name], [name] (MRN [id number]) as of 07/11/2016 07:44  Ref. Range 07/10/2016 07:57 07/10/2016 11:30 07/10/2016 16:28 07/10/2016 22:04 07/11/2016 07:37  Glucose-Capillary Latest Ref Range: 65 - 99 mg/dL 212 (H) 338 (H) 254 (H) 266 (H) 216 (H)   Results for [name], [name] (MRN [id number]) as of 07/11/2016 07:44  Ref. Range 02/18/2016 09:36 07/09/2016 14:40  Hemoglobin A1C Latest Ref Range: 4.8 - 5.6 % 9.7 (H) 10.3 (H)   Review of Glycemic Control  Diabetes history: DM2 Outpatient Diabetes medications: Tresiba 40 units QHS, Humalog 15-21 units TID with meals Current orders for Inpatient glycemic control: Lantus 15 units QHS, Novolog 0-9 units TID with meals, Novolog 0-5 units QHS  Inpatient Diabetes Program Recommendations: Insulin - Basal: Please consider increasing Lantus to 18 units QHS. Correction (SSI): Please consider increasing Novolog correction to moderate scale (0-15 units). Insulin - Meal Coverage: Please consider ordering Novolog 6 units TID with meals for meal coverage if patient eats at least 50% of meals. HgbA1C: A1C 10.3% on 07/09/16 indicating an average glucose of 249 mg/dl over the past 2-3 months. Patient needs to follow up with PCP regarding improving DM control. Outpatient DM medications: At time of discharge, MD may want to consider adjusting insulin since patient is experiencing hypoglycemia at least 3 times per week.   Adendum 07/11/16@12 :30-Spoke with patient about diabetes and home regimen for diabetes control. Patient reports that she is followed by Dr. Dorris Fetch for diabetes management and currently she takes Tresiba 20-40 units QHS (takes 20 units if CBG less than 200 mg/dl and takes 40 units if CBG  200 mg/dl or higher) and Humalog 15-21 units TID with meals as an outpatient for diabetes control. Patient reports that she is taking insulin as prescribed. Patient reports that her Humalog dose was adjusted last time she seen Dr. Dorris Fetch and she has an upcoming appointment with him on 07/21/16. Patient states that she checks her glucose at least 4 times per day and that her glucose fluctuates from 48-200's mg/dl. Inquired about hypoglycemia and patient reports that at least 3 mornings per week she wakes up with hypoglycemia. 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 However, she notes that when she does this, her glucose is always much higher by the time she gets back home and checks her glucose again. Discussed hypoglycemia and treatment with 15 grams of carbohydrates. Encouraged patient to make sure Dr. Dorris Fetch is aware of frequent hypoglycemia because her insulin most likely needs to be adjusted.  Discussed A1C results (10.3% on 07/09/16) and explained that her current A1C indicates an average glucose of 249 mg/dl over the past 2-3 months. Discussed glucose and A1C goals. Question if A1C elevated due to having frequent hypoglycemia and rebound hyperglycemia along with over treatment of hypoglycemia with too many carbohydrates.  Discussed importance of checking CBGs and maintaining good CBG control to prevent long-term and short-term complications. Explained how hyperglycemia leads to damage within blood vessels which lead to the common  complications seen with uncontrolled diabetes. Stressed to the patient the importance of improving glycemic control to prevent further complications from uncontrolled diabetes. Discussed impact of nutrition, exercise, stress, sickness, and medications on diabetes control. Patient states that she tries to follow  a carb modified diet. Patient reports that she needs to exercise more but she is very busy.  Encouraged patient to continue to check glucose at least 4 times per day (before meals and at bedtime) and to keep a log book of glucose readings and insulin taken which she will need to take to follow up appointments with Dr. Dorris Fetch. Again reminded patient to be sure Dr. Dorris Fetch is aware of frequent hypoglycemia.  Patient verbalized understanding of information discussed and she states that she has no further questions at this time related to diabetes.  Thanks, Barnie Alderman, RN, MSN, CDE Diabetes Coordinator Inpatient Diabetes Program [phone] (Team Pager from 8am to 5pm)

## 2016-07-11 NOTE — Progress Notes (Signed)
*  PRELIMINARY RESULTS* Echocardiogram 2D Echocardiogram has been performed.  Leavy Cella 07/11/2016, 11:32 AM

## 2016-07-12 DIAGNOSIS — J9601 Acute respiratory failure with hypoxia: Secondary | ICD-10-CM

## 2016-07-12 LAB — GLUCOSE, CAPILLARY
Glucose-Capillary: 249 mg/dL — ABNORMAL HIGH (ref 65–99)
Glucose-Capillary: 343 mg/dL — ABNORMAL HIGH (ref 65–99)
Glucose-Capillary: 333 mg/dL — ABNORMAL HIGH (ref 65–99)

## 2016-07-12 MED ORDER — IPRATROPIUM-ALBUTEROL 0.5-2.5 (3) MG/3ML IN SOLN: 3.0000 mL | Freq: Two times a day (BID) | RESPIRATORY_TRACT | Status: DC

## 2016-07-12 MED ORDER — AZITHROMYCIN 250 MG PO TABS
500.0000 mg | ORAL_TABLET | Freq: Every day | ORAL | Status: DC
  Filled 2016-07-12: qty 2

## 2016-07-12 MED ORDER — AMOXICILLIN-POT CLAVULANATE 875-125 MG PO TABS: 1.0000 | ORAL_TABLET | Freq: Two times a day (BID) | ORAL | 0 refills | Status: AC

## 2016-07-12 NOTE — Progress Notes (Signed)
Inpatient Diabetes Program Recommendations  AACE/ADA: New Consensus Statement on Inpatient Glycemic Control (2015)  Target Ranges:  Prepandial:   less than 140 mg/dL      Peak postprandial:   less than 180 mg/dL (1-2 hours)      Critically ill patients:  140 - 180 mg/dL   Results for [name], [name] (MRN [id number]) as of 07/12/2016 08:36  Ref. Range 07/11/2016 07:37 07/11/2016 11:12 07/11/2016 16:49 07/11/2016 21:08 07/12/2016 07:51  Glucose-Capillary Latest Ref Range: 65 - 99 mg/dL 216 (H)  Novolog 3 units 267 (H)  Novolog 5 units 273 (H)  Novolog 5 units 197 (H)  Lantus 18 units 249 (H)   Review of Glycemic Control  Diabetes history: DM2 Outpatient Diabetes medications: Tresiba 40 units QHS, Humalog 15-21 units TID with meals Current orders for Inpatient glycemic control: Lantus 18 units QHS, Novolog 0-9 units TID with meals, Novolog 0-5 units QHS  Inpatient Diabetes Program Recommendations: Insulin - Basal: Please consider increasing Lantus to 20 units QHS. Correction (SSI): Please consider increasing Novolog correction to moderate scale (0-15 units). Insulin - Meal Coverage: Please consider ordering Novolog 5 units TID with meals for meal coverage if patient eats at least 50% of meals. HgbA1C: A1C 10.3% on 07/09/16 indicating an average glucose of 249 mg/dl over the past 2-3 months. Patient needs to follow up with PCP regarding improving DM control. Outpatient DM medications: At time of discharge, MD may want to consider adjusting insulin since patient is experiencing hypoglycemia at least 3 times per week.   Thanks, Barnie Alderman, RN, MSN, CDE Diabetes Coordinator Inpatient Diabetes Program [phone] (Team Pager from 8am to 5pm)

## 2016-07-12 NOTE — Progress Notes (Signed)
Pharmacy Antibiotic Note  [name] is a 71 y.o. female admitted on 07/09/2016 with CAP.  Pharmacy has been consulted for ceftriaxone dosing.  Plan: Ceftriaxone 1gm IV q24h Azithromycin 500mg  PO q24h F/U cxs and clinical progress  Height: 5\' 11"  (180.3 cm) Weight: 199 lb 8.3 oz (90.5 kg) IBW/kg (Calculated) : 70.8  Temp (24hrs), Avg:98.5 F (36.9 C), Min:98.4 F (36.9 C), Max:98.5 F (36.9 C)   Recent Labs Lab 07/09/16 0402 07/09/16 0421 07/09/16 1440 07/10/16 0521  WBC 7.8  --   --  6.2  CREATININE 2.64*  --  2.60* 2.36*  LATICACIDVEN  --  1.90  --   --     Estimated Creatinine Clearance: 27.2 mL/min (A) (by C-G formula based on SCr of 2.36 mg/dL (H)).    Allergies  Allergen Reactions  . Ace Inhibitors Cough    Pt can tolerate Tribenzor (and ARBs)  . Codeine Rash   Antimicrobials this admission: Ceftriaxone 5/5 >>  Azithromycin 5/5 >>   Thank you for allowing pharmacy to be a part of this patient's care.  Hart Robinsons, PharmD Clinical Pharmacist Pager:  [phone] 07/12/2016  Addendum: PHARMACIST - PHYSICIAN COMMUNICATION CONCERNING: Antibiotic IV to Oral Route Change Policy  RECOMMENDATION: This patient is receiving ZITHROMAX by the intravenous route.  Based on criteria approved by the Pharmacy and Therapeutics Committee, the antibiotic(s) is/are being converted to the equivalent oral dose form(s).  DESCRIPTION: These criteria include:  Patient being treated for a respiratory tract infection, urinary tract infection, cellulitis or clostridium difficile associated diarrhea if on metronidazole  The patient is not neutropenic and does not exhibit a GI malabsorption state  The patient is eating (either orally or via tube) and/or has been taking other orally administered medications for a least 24 hours  The patient is improving clinically and has a Tmax < 100.5  If you have questions about this conversion, please contact the Pharmacy Department  [x]    [phone] )  Forestine Na

## 2016-07-12 NOTE — Care Management Important Message (Signed)
Important Message  Patient Details  Name: [name] MRN: [id number] Date of Birth: 03/06/1946   Medicare Important Message Given:  Yes    Sherald Barge, RN 07/12/2016, 1:14 PM

## 2016-07-12 NOTE — Progress Notes (Signed)
Discharge instructions given verbalized understanding, out in stable condition via w/c with staff.

## 2016-07-12 NOTE — Discharge Summary (Signed)
Physician Discharge Summary  [name] CBJ:[id number] DOB: 1945-11-29 DOA: 07/09/2016  PCP: [name], MD  Admit date: 07/09/2016 Discharge date: 07/12/2016  Time spent: 45 minutes  Recommendations for Outpatient Follow-up:  -Will be discharged home today. -Advised to follow up with PCP in 2 weeks.   Discharge Diagnoses:  Active Problems:   Essential hypertension   Type 2 diabetes mellitus with stage 4 chronic kidney disease (HCC)   Acute on chronic combined systolic and diastolic CHF (congestive heart failure) (HCC)   Acute respiratory failure with hypercapnia (HCC)   Acute respiratory failure with hypoxia [facility])   Discharge Condition: Stable and improved  Filed Weights   07/09/16 0320 07/10/16 0500  Weight: 83.9 kg (185 lb) 90.5 kg (199 lb 8.3 oz)    History of present illness:  As per Dr. Darrick Meigs on 5/5: [name]  is a 71 y.o. female, With history of CHF, chronic kidney disease stage III, diabetes mellitus, hypertension was brought to  ED for shortness of breath. Patient says that she has been sick for a week and has been having cough, nasal drainage. Patient says that she has been having difficulty sleeping because of cough. Patient had any chest pain. No nausea vomiting or diarrhea.  In the ED, patient was found to be hypertensive with blood pressure 217/118, blood glucose 617, ABG showed pH 7.295, PCO2 59.9. Patient started on glucose stabilizer with IV insulin. Chest x-ray showed dense consolidation in the right lower lobe. Patient empirically started on ceftriaxone and zithromax for pneumonia. Patient started on BiPAP also received 80 mg IV Lasix.  She denies dysuria, no abdominal pain. No fever.   Hospital Course:   Acute Hypercapnic/Hypoxic Respiratory Failure -Required NIPPV transiently. -Has no oxygen requirements on DC.  CAP -Cx data is negative at time of DC. -Will be discharged on augmentin to complete treatment.  Uncontrolled DM  with marked Hyperglycemia -Was on glucostabilizer on admission, not DKA. -Will need close follow up in the OP for dose adjustment of her insulin.  Acute Combined CHF -She is euvolemic on DC. -Transitioned to PO lasix. -No ACE-I due to advanced CKD. -On metoprolol. -ECHO: EF 45% and multiple WMA with grade 2 diastolic dysfunction (unchanged from 11/17). -Is and Os do not appear to be adequately documented.  Demand Ischemia -Trop peaked 1.7. Believed due to acute respiratory illness. -No CP, no acute ischemic changes on EKG. -Continue to cycle troponins. -ECHO as above. -No further cardiac work up at present.  Stage IV CKD -Cr at baseline of around 2.64.    Procedures:  ECHO with results as above  Consultations:  None  Discharge Instructions  Discharge Instructions    AMB Referral to [facility]    Complete by:  As directed    Reason for consult:  Post hospital discharge follow up from a [facility] and a [facility] Pharmacist.   Diagnoses of:   Heart Failure Diabetes     Expected date of contact:  1-3 days (reserved for hospital discharges)   Please assign patient for community nurse to engage for transition of care calls and evaluate for monthly home visits. Please also assign a [facility] pharmacist to address patient expressed pharmacy cost needs. For questions please contact:   Janci Minor RN, [facility] Liaison [phone])   Diet - low sodium heart healthy    Complete by:  As directed    Increase activity slowly    Complete by:  As directed  Allergies as of 07/12/2016      Reactions   Ace Inhibitors Cough   Pt can tolerate Tribenzor (and ARBs)   Codeine Rash      Medication List    STOP taking these medications   HYDROcodone-homatropine 5-1.5 MG/5ML syrup Commonly known as:  HYCODAN   metolazone 2.5 MG tablet Commonly known as:  ZAROXOLYN   trimethoprim 100 MG tablet Commonly known as:  TRIMPEX     TAKE these  medications   acetaminophen 500 MG tablet Commonly known as:  TYLENOL Take 2 tablets (1,000 mg total) by mouth every 6 (six) hours as needed (pain).   albuterol 108 (90 Base) MCG/ACT inhaler Commonly known as:  PROVENTIL HFA;VENTOLIN HFA Inhale 1-2 puffs into the lungs every 6 (six) hours as needed for wheezing or shortness of breath.   ALPRAZolam 0.5 MG tablet Commonly known as:  XANAX Take 0.5 mg by mouth as needed for anxiety or sleep.   amoxicillin-clavulanate 875-125 MG tablet Commonly known as:  AUGMENTIN Take 1 tablet by mouth 2 (two) times daily.   aspirin EC 81 MG tablet Take 81 mg by mouth daily.   calcitRIOL 0.25 MCG capsule Commonly known as:  ROCALTROL Take 0.25 mcg by mouth every Monday, Wednesday, and Friday.   ferrous sulfate 325 (65 FE) MG tablet Take 325 mg by mouth daily.   hydrALAZINE 25 MG tablet Commonly known as:  APRESOLINE Take 2 tablets (50 mg total) by mouth 3 (three) times daily.   insulin degludec 100 UNIT/ML Sopn FlexTouch Pen Commonly known as:  TRESIBA Inject 40 Units into the skin daily at 10 pm.   insulin lispro 100 UNIT/ML KiwkPen Commonly known as:  HUMALOG KWIKPEN Inject 0.15-0.21 mLs (15-21 Units total) into the skin 3 (three) times daily.   isosorbide mononitrate 60 MG 24 hr tablet Commonly known as:  IMDUR Take 1 tablet (60 mg total) by mouth daily.   latanoprost 0.005 % ophthalmic solution Commonly known as:  XALATAN Place 1 drop into both eyes at bedtime.   meclizine 25 MG tablet Commonly known as:  ANTIVERT Take 1 tablet by mouth daily.   metoprolol succinate 50 MG 24 hr tablet Commonly known as:  TOPROL-XL TAKE ONE TABLET BY MOUTH TWICE DAILY WITH  OR  IMMEDIATELY  FOLLOWING  A  MEAL   nitroGLYCERIN 0.4 MG SL tablet Commonly known as:  NITROSTAT Place 1 tablet (0.4 mg total) under the tongue every 5 (five) minutes as needed for chest pain.   potassium chloride SA 20 MEQ tablet Commonly known as:   K-DUR,KLOR-CON Take 2 tablets (40 mEq total) by mouth daily.   RANEXA 500 MG 12 hr tablet Generic drug:  ranolazine TAKE ONE TABLET BY MOUTH TWICE DAILY   rosuvastatin 40 MG tablet Commonly known as:  CRESTOR Take 1 tablet (40 mg total) by mouth at bedtime.   ticagrelor 90 MG Tabs tablet Commonly known as:  BRILINTA Take 1 tablet (90 mg total) by mouth 2 (two) times daily.   torsemide 20 MG tablet Commonly known as:  DEMADEX Take 3 tabs (60mg ) by mouth every morning & 1 tab (20mg ) mid day, can take an extra 20mg  daily if her weight increases by >3 pounds.      Allergies  Allergen Reactions  . Ace Inhibitors Cough    Pt can tolerate Tribenzor (and ARBs)  . Codeine Rash   Follow-up Information    [name], MD. Schedule an appointment as soon as possible for a visit on  07/19/2016.   Specialty:  Family Medicine Why:  Appointment May 15th at 11:45am Contact information: [address] [place] [id number] [phone]            The results of significant diagnostics from this hospitalization (including imaging, microbiology, ancillary and laboratory) are listed below for reference.    Significant Diagnostic Studies: Dg Chest Port 1 View  Result Date: 07/09/2016 CLINICAL DATA:  Dyspnea tonight EXAM: PORTABLE CHEST 1 VIEW COMPARISON:  04/01/2016 FINDINGS: Central and basilar airspace opacity bilaterally, right greater than left. In the right base there are air bronchograms. This may represent pneumonia. No large effusion. IMPRESSION: Dense consolidation in the right base with more patchy consolidation elsewhere in the central and basilar lungs bilaterally. This may represent pneumonia. Followup PA and lateral chest X-ray is recommended in 3-4 weeks following trial of antibiotic therapy to ensure resolution and exclude underlying malignancy. Electronically Signed   By: Andreas Newport M.D.   On: 07/09/2016 04:38    Microbiology: Recent Results (from the past 240  hour(s))  MRSA PCR Screening     Status: None   Collection Time: 07/09/16  8:45 AM  Result Value Ref Range Status   MRSA by PCR NEGATIVE NEGATIVE Final    Comment:        The GeneXpert MRSA Assay (FDA approved for NASAL specimens only), is one component of a comprehensive MRSA colonization surveillance program. It is not intended to diagnose MRSA infection nor to guide or monitor treatment for MRSA infections.   Culture, blood (Routine X 2) w Reflex to ID Panel     Status: None (Preliminary result)   Collection Time: 07/09/16 10:22 AM  Result Value Ref Range Status   Specimen Description BLOOD BLOOD LEFT FOREARM  Final   Special Requests   Final    BOTTLES DRAWN AEROBIC AND ANAEROBIC Blood Culture results may not be optimal due to an inadequate volume of blood received in culture bottles   Culture NO GROWTH 3 DAYS  Final   Report Status PENDING  Incomplete  Culture, blood (Routine X 2) w Reflex to ID Panel     Status: None (Preliminary result)   Collection Time: 07/09/16  2:47 PM  Result Value Ref Range Status   Specimen Description RIGHT ANTECUBITAL  Final   Special Requests   Final    BOTTLES DRAWN AEROBIC AND ANAEROBIC Blood Culture adequate volume   Culture NO GROWTH 3 DAYS  Final   Report Status PENDING  Incomplete     Labs: Basic Metabolic Panel:  Recent Labs Lab 07/09/16 0402 07/09/16 1440 07/10/16 0521  NA 133* 138 135  K 4.6 3.5 3.5  CL 92* 96* 96*  CO2 28 30 32  GLUCOSE 617* 173* 252*  BUN 65* 63* 57*  CREATININE 2.64* 2.60* 2.36*  CALCIUM 10.2 9.8 9.1   Liver Function Tests:  Recent Labs Lab 07/09/16 0402 07/10/16 0521  AST 39 26  ALT 31 19  ALKPHOS 102 68  BILITOT 0.8 0.5  PROT 8.9* 6.3*  ALBUMIN 4.4 3.4*   No results for input(s): LIPASE, AMYLASE in the last 168 hours. No results for input(s): AMMONIA in the last 168 hours. CBC:  Recent Labs Lab 07/09/16 0402 07/10/16 0521  WBC 7.8 6.2  NEUTROABS 5.2  --   HGB 12.6 10.0*  HCT  39.7 31.3*  MCV 103.1* 101.3*  PLT 201 168   Cardiac Enzymes:  Recent Labs Lab 07/09/16 0402 07/09/16 1022 07/09/16 1440 07/09/16 2045  TROPONINI 0.03* 0.24* 0.72* 0.61*   BNP: BNP (last 3 results)  Recent Labs  01/21/16 1158 04/01/16 0112 07/09/16 0402  BNP 574.0* 595.0* 393.0*    ProBNP (last 3 results) No results for input(s): PROBNP in the last 8760 hours.  CBG:  Recent Labs Lab 07/11/16 1649 07/11/16 2108 07/12/16 0751 07/12/16 1044 07/12/16 1156  GLUCAP 273* 197* 249* 343* 333*       Signed:  HERNANDEZ ACOSTA,ESTELA  [facility] Pager: [phone] 07/12/2016, 5:33 PM

## 2016-07-12 NOTE — Care Management Note (Signed)
Case Management Note  Patient Details  Name: [name] MRN: [id number] Date of Birth: [birth year]  Subjective/Objective:                  Pt admitted with resp failure. She is from home, lives with her husband and daughter. She is ind with ADL's. She has PCP, transportation and insurance with drug coverage. She has cane and walker to use as needed. She drives. She reports no HH needs. She does not meet criteria for supplemental oxygen.  Action/Plan: Pt plans to return home with self care today.   Expected Discharge Date:  07/12/16               Expected Discharge Plan:  Home/Self Care  In-House Referral:  NA  Discharge planning Services  CM Consult  Post Acute Care Choice:  NA Choice offered to:  NA  Status of Service:  Completed, signed off   Sherald Barge, RN 07/12/2016, 1:15 PM

## 2016-07-12 NOTE — Consult Note (Signed)
   [facility] CM Inpatient Consult   07/12/2016  [name] 10-24-1945 [id number]  Chart review revealed patient eligible for [facility] Management services and post hospital discharge follow up related to a diagnosis of DM with recent Hgb A1c of 10.3. Patient was evaluated for community based chronic disease management services with [facility] care Management Program as a benefit of patient's [employer]. Met with the patient at the bedside to explain [place] services. Patient endorses her primary care provider to be [name], MD. Patient states she lives in [place] but visits the MD in [place]. Patient states she has high cost insulin, diabetic supplies and other medication costs.  Consent form signed. Patient gave [phone] as the best number to reach her. She also gave written permission to call Celita Abbott at [phone] if she can not be reached. Patient will receive post hospital discharge calls and be evaluated for monthly home visits. She will also be assigned to a [facility] pharmacist to assist her with pharmacy cost needs. [facility] Care Management does not interfere with or replace any services arranged by the inpatient care management team. RNCM left contact information and [facility] literature at the bedside. Made inpatient RNCM aware that [facility] will be following for care management. For additional questions please contact:   Kourtney Montesinos RN, [facility] Liaison  [phone]) Business Mobile [phone]) Toll free office

## 2016-07-14 ENCOUNTER — Emergency Department (HOSPITAL_COMMUNITY): Payer: BLUE CROSS/BLUE SHIELD

## 2016-07-14 ENCOUNTER — Encounter (HOSPITAL_COMMUNITY): Payer: Self-pay | Admitting: *Deleted

## 2016-07-14 ENCOUNTER — Inpatient Hospital Stay (HOSPITAL_COMMUNITY)
Admission: EM | Admit: 2016-07-14 | Discharge: 2016-07-16 | DRG: 291 | Disposition: A | Discharge status: Home Health | Payer: BLUE CROSS/BLUE SHIELD | Attending: Internal Medicine | Admitting: Internal Medicine

## 2016-07-14 DIAGNOSIS — I5043 Acute on chronic combined systolic (congestive) and diastolic (congestive) heart failure: Secondary | ICD-10-CM | POA: Diagnosis present

## 2016-07-14 DIAGNOSIS — I130 Hypertensive heart and chronic kidney disease with heart failure and stage 1 through stage 4 chronic kidney disease, or unspecified chronic kidney disease: Principal | ICD-10-CM | POA: Diagnosis present

## 2016-07-14 DIAGNOSIS — Z7982 Long term (current) use of aspirin: Secondary | ICD-10-CM

## 2016-07-14 DIAGNOSIS — R778 Other specified abnormalities of plasma proteins: Secondary | ICD-10-CM | POA: Diagnosis present

## 2016-07-14 DIAGNOSIS — I10 Essential (primary) hypertension: Secondary | ICD-10-CM | POA: Diagnosis not present

## 2016-07-14 DIAGNOSIS — R000 Tachycardia, unspecified: Secondary | ICD-10-CM | POA: Diagnosis present

## 2016-07-14 DIAGNOSIS — E669 Obesity, unspecified: Secondary | ICD-10-CM | POA: Diagnosis present

## 2016-07-14 DIAGNOSIS — R7989 Other specified abnormal findings of blood chemistry: Secondary | ICD-10-CM | POA: Diagnosis present

## 2016-07-14 DIAGNOSIS — J9601 Acute respiratory failure with hypoxia: Secondary | ICD-10-CM | POA: Diagnosis present

## 2016-07-14 DIAGNOSIS — Z794 Long term (current) use of insulin: Secondary | ICD-10-CM | POA: Diagnosis not present

## 2016-07-14 DIAGNOSIS — E1122 Type 2 diabetes mellitus with diabetic chronic kidney disease: Secondary | ICD-10-CM | POA: Diagnosis not present

## 2016-07-14 DIAGNOSIS — J810 Acute pulmonary edema: Secondary | ICD-10-CM | POA: Diagnosis not present

## 2016-07-14 DIAGNOSIS — J811 Chronic pulmonary edema: Secondary | ICD-10-CM | POA: Diagnosis present

## 2016-07-14 DIAGNOSIS — K5900 Constipation, unspecified: Secondary | ICD-10-CM | POA: Diagnosis not present

## 2016-07-14 DIAGNOSIS — J189 Pneumonia, unspecified organism: Secondary | ICD-10-CM | POA: Diagnosis present

## 2016-07-14 DIAGNOSIS — I2510 Atherosclerotic heart disease of native coronary artery without angina pectoris: Secondary | ICD-10-CM | POA: Diagnosis present

## 2016-07-14 DIAGNOSIS — R0600 Dyspnea, unspecified: Secondary | ICD-10-CM

## 2016-07-14 DIAGNOSIS — F419 Anxiety disorder, unspecified: Secondary | ICD-10-CM | POA: Diagnosis present

## 2016-07-14 DIAGNOSIS — I248 Other forms of acute ischemic heart disease: Secondary | ICD-10-CM | POA: Diagnosis present

## 2016-07-14 DIAGNOSIS — Z6827 Body mass index (BMI) 27.0-27.9, adult: Secondary | ICD-10-CM

## 2016-07-14 DIAGNOSIS — Z955 Presence of coronary angioplasty implant and graft: Secondary | ICD-10-CM

## 2016-07-14 DIAGNOSIS — R0602 Shortness of breath: Secondary | ICD-10-CM | POA: Diagnosis not present

## 2016-07-14 DIAGNOSIS — N186 End stage renal disease: Secondary | ICD-10-CM

## 2016-07-14 DIAGNOSIS — I160 Hypertensive urgency: Secondary | ICD-10-CM | POA: Diagnosis present

## 2016-07-14 DIAGNOSIS — E785 Hyperlipidemia, unspecified: Secondary | ICD-10-CM | POA: Diagnosis present

## 2016-07-14 DIAGNOSIS — N184 Chronic kidney disease, stage 4 (severe): Secondary | ICD-10-CM | POA: Diagnosis not present

## 2016-07-14 DIAGNOSIS — Z7902 Long term (current) use of antithrombotics/antiplatelets: Secondary | ICD-10-CM

## 2016-07-14 DIAGNOSIS — I252 Old myocardial infarction: Secondary | ICD-10-CM

## 2016-07-14 DIAGNOSIS — K219 Gastro-esophageal reflux disease without esophagitis: Secondary | ICD-10-CM | POA: Diagnosis present

## 2016-07-14 LAB — BLOOD GAS, ARTERIAL
ACID-BASE EXCESS: 1.4 mmol/L (ref 0.0–2.0)
Bicarbonate: 22.4 mmol/L (ref 20.0–28.0)
DRAWN BY: 222231
Delivery systems: POSITIVE
Expiratory PAP: 6
FIO2: 55
INSPIRATORY PAP: 16
LHR: 10 {breaths}/min
O2 SAT: 94.7 %
PO2 ART: 89.6 mmHg (ref 83.0–108.0)
pCO2 arterial: 51.7 mmHg — ABNORMAL HIGH (ref 32.0–48.0)
pH, Arterial: 7.292 — ABNORMAL LOW (ref 7.350–7.450)

## 2016-07-14 LAB — COMPREHENSIVE METABOLIC PANEL
ALK PHOS: 102 U/L (ref 38–126)
ALT: 29 U/L (ref 14–54)
ANION GAP: 15 (ref 5–15)
AST: 35 U/L (ref 15–41)
Albumin: 4.2 g/dL (ref 3.5–5.0)
BILIRUBIN TOTAL: 0.7 mg/dL (ref 0.3–1.2)
BUN: 50 mg/dL — ABNORMAL HIGH (ref 6–20)
CALCIUM: 10 mg/dL (ref 8.9–10.3)
CO2: 26 mmol/L (ref 22–32)
Chloride: 94 mmol/L — ABNORMAL LOW (ref 101–111)
Creatinine, Ser: 2.28 mg/dL — ABNORMAL HIGH (ref 0.44–1.00)
GFR calc Af Amer: 24 mL/min — ABNORMAL LOW (ref >60)
GFR calc non Af Amer: 20 mL/min — ABNORMAL LOW (ref >60)
GLUCOSE: 431 mg/dL — AB (ref 65–99)
POTASSIUM: 4 mmol/L (ref 3.5–5.1)
SODIUM: 135 mmol/L (ref 135–145)
TOTAL PROTEIN: 8.6 g/dL — AB (ref 6.5–8.1)

## 2016-07-14 LAB — GLUCOSE, CAPILLARY
GLUCOSE-CAPILLARY: 421 mg/dL — AB (ref 65–99)
GLUCOSE-CAPILLARY: 245 mg/dL — AB (ref 65–99)
Glucose-Capillary: 288 mg/dL — ABNORMAL HIGH (ref 65–99)

## 2016-07-14 LAB — CBC WITH DIFFERENTIAL/PLATELET
BASOS ABS: 0 10*3/uL (ref 0.0–0.1)
BASOS PCT: 1 %
EOS ABS: 0.1 10*3/uL (ref 0.0–0.7)
EOS PCT: 1 %
HCT: 39.6 % (ref 36.0–46.0)
HEMOGLOBIN: 12.8 g/dL (ref 12.0–15.0)
LYMPHS ABS: 2 10*3/uL (ref 0.7–4.0)
Lymphocytes Relative: 26 %
MCH: 32.7 pg (ref 26.0–34.0)
MCHC: 32.3 g/dL (ref 30.0–36.0)
MCV: 101.3 fL — ABNORMAL HIGH (ref 78.0–100.0)
Monocytes Absolute: 0.4 10*3/uL (ref 0.1–1.0)
Monocytes Relative: 5 %
Neutro Abs: 5.3 10*3/uL (ref 1.7–7.7)
Neutrophils Relative %: 67 %
PLATELETS: 198 10*3/uL (ref 150–400)
RBC: 3.91 MIL/uL (ref 3.87–5.11)
RDW: 14.6 % (ref 11.5–15.5)
WBC: 7.8 10*3/uL (ref 4.0–10.5)

## 2016-07-14 LAB — CULTURE, BLOOD (ROUTINE X 2)
CULTURE: NO GROWTH
CULTURE: NO GROWTH
Special Requests: ADEQUATE

## 2016-07-14 LAB — CBG MONITORING, ED: Glucose-Capillary: 391 mg/dL — ABNORMAL HIGH (ref 65–99)

## 2016-07-14 LAB — BRAIN NATRIURETIC PEPTIDE: B Natriuretic Peptide: 345 pg/mL — ABNORMAL HIGH (ref 0.0–100.0)

## 2016-07-14 LAB — TROPONIN I: Troponin I: 0.08 ng/mL (ref <0.03)

## 2016-07-14 MED ORDER — ACETAMINOPHEN 325 MG PO TABS: 650.0000 mg | ORAL_TABLET | ORAL | Status: DC | PRN

## 2016-07-14 MED ORDER — FUROSEMIDE 10 MG/ML IJ SOLN
80.0000 mg | Freq: Three times a day (TID) | INTRAMUSCULAR | Status: DC
  Administered 2016-07-14 – 2016-07-16 (×6): 80 mg via INTRAVENOUS
  Filled 2016-07-14 (×7): qty 8

## 2016-07-14 MED ORDER — NITROGLYCERIN IN D5W 200-5 MCG/ML-% IV SOLN
10.0000 ug/min | INTRAVENOUS | Status: DC
  Filled 2016-07-14: qty 250

## 2016-07-14 MED ORDER — ONDANSETRON HCL 4 MG/2ML IJ SOLN: 4.0000 mg | Freq: Four times a day (QID) | INTRAMUSCULAR | Status: DC | PRN

## 2016-07-14 MED ORDER — ISOSORBIDE MONONITRATE ER 60 MG PO TB24
60.0000 mg | ORAL_TABLET | Freq: Every day | ORAL | Status: DC
  Administered 2016-07-14 – 2016-07-16 (×3): 60 mg via ORAL
  Filled 2016-07-14 (×3): qty 1

## 2016-07-14 MED ORDER — BISACODYL 10 MG RE SUPP
10.0000 mg | Freq: Two times a day (BID) | RECTAL | Status: AC
  Administered 2016-07-14 – 2016-07-15 (×3): 10 mg via RECTAL
  Filled 2016-07-14 (×3): qty 1

## 2016-07-14 MED ORDER — MECLIZINE HCL 12.5 MG PO TABS
25.0000 mg | ORAL_TABLET | Freq: Every day | ORAL | Status: DC
  Administered 2016-07-14 – 2016-07-16 (×3): 25 mg via ORAL
  Filled 2016-07-14 (×3): qty 2

## 2016-07-14 MED ORDER — ASPIRIN 300 MG RE SUPP: 300.0000 mg | Freq: Once | RECTAL | Status: DC

## 2016-07-14 MED ORDER — LATANOPROST 0.005 % OP SOLN
1.0000 [drp] | Freq: Every day | OPHTHALMIC | Status: DC
  Administered 2016-07-14 – 2016-07-15 (×2): 1 [drp] via OPHTHALMIC
  Filled 2016-07-14: qty 2.5

## 2016-07-14 MED ORDER — ACETAMINOPHEN 500 MG PO TABS: 1000.0000 mg | ORAL_TABLET | Freq: Four times a day (QID) | ORAL | Status: DC | PRN

## 2016-07-14 MED ORDER — HEPARIN SODIUM (PORCINE) 5000 UNIT/ML IJ SOLN
5000.0000 [IU] | Freq: Three times a day (TID) | INTRAMUSCULAR | Status: DC
  Administered 2016-07-14 – 2016-07-16 (×6): 5000 [IU] via SUBCUTANEOUS
  Filled 2016-07-14 (×6): qty 1

## 2016-07-14 MED ORDER — CALCITRIOL 0.25 MCG PO CAPS
0.2500 ug | ORAL_CAPSULE | ORAL | Status: DC
  Administered 2016-07-15: 0.25 ug via ORAL
  Filled 2016-07-14: qty 1

## 2016-07-14 MED ORDER — SODIUM CHLORIDE 0.9 % IV SOLN: 250.0000 mL | INTRAVENOUS | Status: DC | PRN

## 2016-07-14 MED ORDER — ALBUTEROL SULFATE (2.5 MG/3ML) 0.083% IN NEBU
5.0000 mg | INHALATION_SOLUTION | Freq: Once | RESPIRATORY_TRACT | Status: AC
  Administered 2016-07-14: 5 mg via RESPIRATORY_TRACT
  Filled 2016-07-14: qty 6

## 2016-07-14 MED ORDER — HYDRALAZINE HCL 25 MG PO TABS
50.0000 mg | ORAL_TABLET | Freq: Three times a day (TID) | ORAL | Status: DC
  Administered 2016-07-14 – 2016-07-16 (×7): 50 mg via ORAL
  Filled 2016-07-14 (×7): qty 2

## 2016-07-14 MED ORDER — RANOLAZINE ER 500 MG PO TB12
500.0000 mg | ORAL_TABLET | Freq: Two times a day (BID) | ORAL | Status: DC
  Administered 2016-07-14 – 2016-07-16 (×5): 500 mg via ORAL
  Filled 2016-07-14 (×9): qty 1

## 2016-07-14 MED ORDER — INSULIN ASPART 100 UNIT/ML ~~LOC~~ SOLN
0.0000 [IU] | Freq: Three times a day (TID) | SUBCUTANEOUS | Status: DC
Start: 2016-07-14 — End: 2016-07-16
  Administered 2016-07-14: 5 [IU] via SUBCUTANEOUS
  Administered 2016-07-14: 9 [IU] via SUBCUTANEOUS
  Administered 2016-07-15 (×2): 5 [IU] via SUBCUTANEOUS
  Administered 2016-07-15: 9 [IU] via SUBCUTANEOUS
  Administered 2016-07-16: 3 [IU] via SUBCUTANEOUS
  Administered 2016-07-16: 5 [IU] via SUBCUTANEOUS

## 2016-07-14 MED ORDER — FERROUS SULFATE 325 (65 FE) MG PO TABS
325.0000 mg | ORAL_TABLET | Freq: Every day | ORAL | Status: DC
  Administered 2016-07-14 – 2016-07-16 (×3): 325 mg via ORAL
  Filled 2016-07-14 (×3): qty 1

## 2016-07-14 MED ORDER — INSULIN GLARGINE 100 UNIT/ML ~~LOC~~ SOLN
20.0000 [IU] | Freq: Every day | SUBCUTANEOUS | Status: DC
  Administered 2016-07-14 – 2016-07-15 (×2): 20 [IU] via SUBCUTANEOUS
  Filled 2016-07-14 (×5): qty 0.2

## 2016-07-14 MED ORDER — TICAGRELOR 90 MG PO TABS
90.0000 mg | ORAL_TABLET | Freq: Two times a day (BID) | ORAL | Status: DC
  Administered 2016-07-14 – 2016-07-16 (×5): 90 mg via ORAL
  Filled 2016-07-14 (×9): qty 1

## 2016-07-14 MED ORDER — AMOXICILLIN-POT CLAVULANATE 875-125 MG PO TABS
1.0000 | ORAL_TABLET | Freq: Two times a day (BID) | ORAL | Status: DC
  Administered 2016-07-14 – 2016-07-15 (×4): 1 via ORAL
  Filled 2016-07-14 (×9): qty 1

## 2016-07-14 MED ORDER — INSULIN ASPART 100 UNIT/ML ~~LOC~~ SOLN
0.0000 [IU] | Freq: Every day | SUBCUTANEOUS | Status: DC
  Administered 2016-07-14: 2 [IU] via SUBCUTANEOUS

## 2016-07-14 MED ORDER — ALPRAZOLAM 0.5 MG PO TABS
0.5000 mg | ORAL_TABLET | Freq: Three times a day (TID) | ORAL | Status: DC | PRN
  Administered 2016-07-14 – 2016-07-15 (×2): 0.5 mg via ORAL
  Filled 2016-07-14 (×2): qty 1

## 2016-07-14 MED ORDER — SODIUM CHLORIDE 0.9% FLUSH: 3.0000 mL | INTRAVENOUS | Status: DC | PRN

## 2016-07-14 MED ORDER — NITROGLYCERIN 0.4 MG SL SUBL: 0.4000 mg | SUBLINGUAL_TABLET | SUBLINGUAL | Status: DC | PRN

## 2016-07-14 MED ORDER — INSULIN DEGLUDEC 100 UNIT/ML ~~LOC~~ SOPN: 40.0000 [IU] | PEN_INJECTOR | Freq: Every day | SUBCUTANEOUS | Status: DC

## 2016-07-14 MED ORDER — FUROSEMIDE 10 MG/ML IJ SOLN
80.0000 mg | Freq: Once | INTRAMUSCULAR | Status: DC
  Filled 2016-07-14: qty 8

## 2016-07-14 MED ORDER — CHLORHEXIDINE GLUCONATE 0.12 % MT SOLN
15.0000 mL | Freq: Two times a day (BID) | OROMUCOSAL | Status: DC
  Administered 2016-07-14 – 2016-07-16 (×5): 15 mL via OROMUCOSAL
  Filled 2016-07-14 (×7): qty 15

## 2016-07-14 MED ORDER — ORAL CARE MOUTH RINSE
15.0000 mL | Freq: Two times a day (BID) | OROMUCOSAL | Status: DC
  Administered 2016-07-14 – 2016-07-15 (×4): 15 mL via OROMUCOSAL

## 2016-07-14 MED ORDER — ASPIRIN EC 81 MG PO TBEC
81.0000 mg | DELAYED_RELEASE_TABLET | Freq: Every day | ORAL | Status: DC
  Administered 2016-07-14 – 2016-07-16 (×3): 81 mg via ORAL
  Filled 2016-07-14 (×3): qty 1

## 2016-07-14 MED ORDER — POTASSIUM CHLORIDE CRYS ER 20 MEQ PO TBCR
40.0000 meq | EXTENDED_RELEASE_TABLET | Freq: Every day | ORAL | Status: DC
  Administered 2016-07-14 – 2016-07-16 (×3): 40 meq via ORAL
  Filled 2016-07-14 (×3): qty 2

## 2016-07-14 MED ORDER — METOPROLOL SUCCINATE ER 50 MG PO TB24
50.0000 mg | ORAL_TABLET | Freq: Two times a day (BID) | ORAL | Status: DC
  Administered 2016-07-14 – 2016-07-16 (×5): 50 mg via ORAL
  Filled 2016-07-14 (×6): qty 1

## 2016-07-14 MED ORDER — ROSUVASTATIN CALCIUM 20 MG PO TABS
40.0000 mg | ORAL_TABLET | Freq: Every day | ORAL | Status: DC
  Administered 2016-07-14 – 2016-07-15 (×2): 40 mg via ORAL
  Filled 2016-07-14 (×2): qty 2

## 2016-07-14 MED ORDER — SODIUM CHLORIDE 0.9% FLUSH
3.0000 mL | Freq: Two times a day (BID) | INTRAVENOUS | Status: DC
  Administered 2016-07-14 – 2016-07-16 (×5): 3 mL via INTRAVENOUS

## 2016-07-14 MED ORDER — HYDRALAZINE HCL 20 MG/ML IJ SOLN: 10.0000 mg | INTRAMUSCULAR | Status: DC | PRN

## 2016-07-14 NOTE — ED Provider Notes (Signed)
[facility] DEPT Provider Note   CSN: [id number] Arrival date & time: 07/14/16  0542     History   Chief Complaint Chief Complaint  Patient presents with  . Shortness of Breath    HPI [name] is a 71 y.o. female.  HPI  71 year old female with a history of CHF, chronic kidney disease, and diabetes presents with acute dyspnea. Start at around 9 PM. Family drove from [place] to get here. She has been feeling okay since discharge 3 days ago. Mild dyspnea but nothing compared to tonight. She does not wear oxygen normally. She has not no sitting leg swelling, chest pain, fevers, or cough. Last time she felt this way BiPAP helped significantly.  Past Medical History:  Diagnosis Date  . Acute CHF (congestive heart failure) ([place]) 07/04/2015  . Acute renal failure superimposed on stage 4 chronic kidney disease ([place]) 07/04/2015  . Allergic rhinitis   . Anxiety   . Constipation   . Diabetes mellitus   . GERD (gastroesophageal reflux disease)   . Heart murmur, systolic   . History of hysterectomy   . Hyperlipidemia   . Hypertension   . Low back pain   . Obesity   . Osteoarthritis   . Overactive bladder     Patient Active Problem List   Diagnosis Date Noted  . Pulmonary edema 07/14/2016  . CKD (chronic kidney disease), stage IV ([place]) 07/14/2016  . Acute respiratory failure with hypoxia ([place]) 07/09/2016  . Acute on chronic systolic CHF (congestive heart failure) ([place]) 12/01/2015  . AKI (acute kidney injury) ([place])   . Ischemia of upper extremity   . Right knee pain   . Subclavian artery stenosis, right ([place]) 07/07/2015  . Acute on chronic combined systolic and diastolic CHF (congestive heart failure) ([place]) 07/04/2015  . Elevated troponin 07/04/2015  . NSTEMI (non-ST elevated myocardial infarction) ([place]) 07/04/2015  . Elevated d-dimer 07/04/2015  . Acute respiratory failure with hypercapnia ([place])   . Bilateral lower extremity edema 01/20/2012  . Type 2 diabetes  mellitus with stage 4 chronic kidney disease ([place]) 01/21/2011  . Overweight 07/20/2009  . Atherosclerosis of native coronary artery of native heart with unstable angina pectoris ([place]) 10/08/2008  . HEART MURMUR, SYSTOLIC 77/82/4235  . SHOULDER PAIN 02/05/2007  . Hyperlipemia 04/11/2006  . ANXIETY 04/11/2006  . SYNDROME, CARPAL TUNNEL 04/11/2006  . Essential hypertension 04/11/2006  . ALLERGIC RHINITIS 04/11/2006  . GERD 04/11/2006  . Constipation 04/11/2006  . OVERACTIVE BLADDER 04/11/2006  . OSTEOARTHRITIS 04/11/2006  . LOW BACK PAIN 04/11/2006    Past Surgical History:  Procedure Laterality Date  . ABDOMINAL HYSTERECTOMY    . BACK SURGERY     multiple  . CARDIAC CATHETERIZATION  04/04/2006   Est EF of 60%  . CARDIAC CATHETERIZATION N/A 07/04/2015   Procedure: Left Heart Cath and Coronary Angiography;  Surgeon: Troy Sine, MD;  Location: [facility];  Service: Cardiovascular;  Laterality: N/A;  . CARDIAC CATHETERIZATION N/A 07/04/2015   Procedure: Coronary Stent Intervention;  Surgeon: Troy Sine, MD;  Location: [facility];  Service: Cardiovascular;  Laterality: N/A;  . CARDIAC CATHETERIZATION N/A 10/13/2015   Procedure: Left Heart Cath and Coronary Angiography;  Surgeon: Troy Sine, MD;  Location: [facility];  Service: Cardiovascular;  Laterality: N/A;  . CARDIAC CATHETERIZATION N/A 12/02/2015   Procedure: Left Heart Cath and Coronary Angiography;  Surgeon: Peter M Martinique, MD;  Location: [facility];  Service: Cardiovascular;  Laterality: N/A;  . CARPAL TUNNEL RELEASE    . CERVICAL BIOPSY     cervical lymph node biopsies  . COLONOSCOPY  May 2002   Dr. Irving Shows :Followup in 5 years, normal exam  . COLONOSCOPY  2008   Dr. Laural Golden: Very redundant colon with mild melanosis coli, splenic flexure polyp biopsy with acute complaint of benign colon polyp. Recommended ten-year followup  . CORONARY STENT PLACEMENT  04/11/2006   2 -- Taxus stents  to the circumflex   . PERIPHERAL VASCULAR CATHETERIZATION Right 07/09/2015   Procedure: Upper Extremity Angiography;  Surgeon: Conrad Tustin, MD;  Location: [facility];  Service: Cardiovascular;  Laterality: Right;  . PERIPHERAL VASCULAR CATHETERIZATION Right 07/10/2015   Procedure: RIGHT SUBCLAVIAN ARTERY THROMBECTOMY;  Surgeon: Serafina Mitchell, MD;  Location: [facility];  Service: Vascular;  Laterality: Right;  . tendonitis     bilateral elbow  . TRIGGER FINGER RELEASE      OB History    No data available       Home Medications    Prior to Admission medications   Medication Sig Start Date End Date Taking? Authorizing Provider  acetaminophen (TYLENOL) 500 MG tablet Take 2 tablets (1,000 mg total) by mouth every 6 (six) hours as needed (pain). 07/12/15  Yes Lily Kocher, MD  albuterol (PROVENTIL HFA;VENTOLIN HFA) 108 (90 Base) MCG/ACT inhaler Inhale 1-2 puffs into the lungs every 6 (six) hours as needed for wheezing or shortness of breath. 04/01/16  Yes Nanavati, Ankit, MD  ALPRAZolam (XANAX) 0.5 MG tablet Take 0.5 mg by mouth as needed for anxiety or sleep.    Yes [provider]  amoxicillin-clavulanate (AUGMENTIN) 875-125 MG tablet Take 1 tablet by mouth 2 (two) times daily. 07/12/16 07/17/16 Yes Erline Hau, MD  aspirin EC 81 MG tablet Take 81 mg by mouth daily.   Yes [provider]  calcitRIOL (ROCALTROL) 0.25 MCG capsule Take 0.25 mcg by mouth every Monday, Wednesday, and Friday.  03/23/15  Yes [provider]  cetirizine (ZYRTEC) 10 MG tablet Take 10 mg by mouth daily as needed for allergies.   Yes [provider]  ferrous sulfate 325 (65 FE) MG tablet Take 325 mg by mouth daily.   Yes [provider]  hydrALAZINE (APRESOLINE) 25 MG tablet Take 2 tablets (50 mg total) by mouth 3 (three) times daily. 01/21/16  Yes Herminio Commons, MD  insulin degludec (TRESIBA) 100 UNIT/ML SOPN FlexTouch Pen Inject 40 Units into the skin  daily at 10 pm.   Yes [provider]  insulin glargine (LANTUS) 100 UNIT/ML injection Inject 20-50 Units into the skin at bedtime.   Yes [provider]  insulin lispro (HUMALOG KWIKPEN) 100 UNIT/ML KiwkPen Inject 0.15-0.21 mLs (15-21 Units total) into the skin 3 (three) times daily. 03/22/16  Yes Cassandria Anger, MD  ipratropium (ATROVENT) 0.06 % nasal spray Place 2 sprays into the nose 2 (two) times daily. 05/24/16  Yes [provider]  isosorbide mononitrate (IMDUR) 60 MG 24 hr tablet Take 1 tablet (60 mg total) by mouth daily. 07/12/15  Yes Lily Kocher, MD  latanoprost (XALATAN) 0.005 % ophthalmic solution Place 1 drop into both eyes at bedtime.  10/25/11  Yes [provider]  meclizine (ANTIVERT) 25 MG tablet Take 1 tablet by mouth daily. 03/06/16  Yes [provider]  metoprolol succinate (TOPROL-XL) 50 MG 24 hr tablet TAKE ONE TABLET BY MOUTH TWICE DAILY WITH  OR  IMMEDIATELY  FOLLOWING  A  MEAL 04/28/16  Yes Lyda Jester M, PA-C  Multiple Vitamin (MULTIVITAMIN WITH MINERALS) TABS tablet Take 1 tablet by mouth daily.   Yes [provider]  nitroGLYCERIN (NITROSTAT) 0.4 MG SL tablet Place 1 tablet (0.4 mg total) under the tongue every 5 (five) minutes as needed for chest pain. 10/14/15  Yes Simmons, Brittainy M, PA-C  pantoprazole (PROTONIX) 40 MG tablet Take 1 tablet by mouth daily. 06/28/16  Yes [provider]  potassium chloride SA (K-DUR,KLOR-CON) 20 MEQ tablet Take 2 tablets (40 mEq total) by mouth daily. 10/14/15  Yes Simmons, Brittainy M, PA-C  RANEXA 500 MG 12 hr tablet TAKE ONE TABLET BY MOUTH TWICE DAILY 05/04/16  Yes Troy Sine, MD  rosuvastatin (CRESTOR) 40 MG tablet Take 1 tablet (40 mg total) by mouth at bedtime. 07/12/15  Yes Lily Kocher, MD  ticagrelor (BRILINTA) 90 MG TABS tablet Take 1 tablet (90 mg total) by mouth 2 (two) times daily. 10/14/15  Yes Rosita Fire, Brittainy M, PA-C  torsemide (DEMADEX) 20 MG tablet  Take 3 tabs (60mg ) by mouth every morning & 1 tab (20mg ) mid day, can take an extra 20mg  daily if her weight increases by >3 pounds. 12/03/15  Yes Arbutus Leas, NP  trimethoprim (TRIMPEX) 100 MG tablet Take 1 tablet by mouth daily. 06/06/16  Yes [provider]    Family History Family History  Problem Relation Age of Onset  . Diabetes Father   . Hypertension Father   . Stroke Father   . Hypertension Brother   . Aneurysm Brother   . Diabetes Brother   . Colon cancer Neg Hx     Social History Social History  Substance Use Topics  . Smoking status: Never Smoker  . Smokeless tobacco: Never Used  . Alcohol use No     Allergies   Ace inhibitors and Codeine   Review of Systems Review of Systems  Constitutional: Negative for fever.  Respiratory: Positive for shortness of breath. Negative for cough.   Cardiovascular: Negative for chest pain and leg swelling.  Gastrointestinal: Negative for vomiting.  All other systems reviewed and are negative.    Physical Exam Updated Vital Signs BP 136/61   Pulse 78   Temp 98.2 F (36.8 C) (Oral)   Resp 19   Ht 5\' 11"  (1.803 m)   Wt 199 lb (90.3 kg)   SpO2 100%   BMI 27.75 kg/m   Physical Exam  Constitutional: She is oriented to person, place, and time. She appears well-developed and well-nourished.  HENT:  Head: Normocephalic and atraumatic.  Right Ear: External ear normal.  Left Ear: External ear normal.  Nose: Nose normal.  Eyes: Right eye exhibits no discharge. Left eye exhibits no discharge.  Cardiovascular: Normal rate, regular rhythm and normal heart sounds.   Pulmonary/Chest: Accessory muscle usage present. Tachypnea noted. She has wheezes. She has rales.  Abdominal: Soft. There is no tenderness.  Neurological: She is alert and oriented to person, place, and time.  Skin: Skin is warm. She is diaphoretic.  Nursing note and vitals reviewed.    ED Treatments / Results  Labs (all labs ordered are listed, but  only abnormal results are displayed) Labs Reviewed  COMPREHENSIVE METABOLIC PANEL - Abnormal; Notable for the following:       Result Value   Chloride 94 (*)    Glucose, Bld 431 (*)    BUN 50 (*)    Creatinine, Ser 2.28 (*)    Total  Protein 8.6 (*)    GFR calc non Af Amer 20 (*)    GFR calc Af Amer 24 (*)    All other components within normal limits  TROPONIN I - Abnormal; Notable for the following:    Troponin I 0.08 (*)    All other components within normal limits  BRAIN NATRIURETIC PEPTIDE - Abnormal; Notable for the following:    B Natriuretic Peptide 345.0 (*)    All other components within normal limits  CBC WITH DIFFERENTIAL/PLATELET - Abnormal; Notable for the following:    MCV 101.3 (*)    All other components within normal limits  BLOOD GAS, ARTERIAL - Abnormal; Notable for the following:    pH, Arterial 7.292 (*)    pCO2 arterial 51.7 (*)    All other components within normal limits  CBG MONITORING, ED - Abnormal; Notable for the following:    Glucose-Capillary 391 (*)    All other components within normal limits  MRSA PCR SCREENING    EKG  EKG Interpretation  Date/Time:  Thursday Jul 14 2016 05:54:49 EDT Ventricular Rate:  104 PR Interval:    QRS Duration: 107 QT Interval:  347 QTC Calculation: 457 R Axis:   7 Text Interpretation:  Sinus tachycardia Incomplete left bundle branch block Low voltage, extremity leads no significant change since Jul 09 2016 Confirmed by Sherwood Gambler [phone]) on 07/14/2016 6:04:00 AM       Radiology Dg Chest Portable 1 View  Result Date: 07/14/2016 CLINICAL DATA:  71 y/o  F; acute dyspnea. EXAM: PORTABLE CHEST 1 VIEW COMPARISON:  07/09/2016 chest radiograph FINDINGS: Increased hazy opacification of the lungs and lower lobe consolidations probably representing pulmonary edema. Cardiac silhouette is obscured by airspace disease. Underlying pneumonia is not excluded. No acute osseous abnormality identified. IMPRESSION: Increased  hazy opacification of the lungs and lower lobe consolidations probably representing pulmonary edema. Cardiac silhouette is obscured by airspace disease. Underlying pneumonia is not excluded. Electronically Signed   By: Kristine Garbe M.D.   On: 07/14/2016 06:31    Procedures Procedures (including critical care time)  CRITICAL CARE Performed by: Sherwood Gambler T   Total critical care time: 30 minutes  Critical care time was exclusive of separately billable procedures and treating other patients.  Critical care was necessary to treat or prevent imminent or life-threatening deterioration.  Critical care was time spent personally by me on the following activities: development of treatment plan with patient and/or surrogate as well as nursing, discussions with consultants, evaluation of patient's response to treatment, examination of patient, obtaining history from patient or surrogate, ordering and performing treatments and interventions, ordering and review of laboratory studies, ordering and review of radiographic studies, pulse oximetry and re-evaluation of patient's condition.   Angiocath insertion Performed by: Sherwood Gambler T  Consent: Verbal consent obtained. Risks and benefits: risks, benefits and alternatives were discussed Time out: Immediately prior to procedure a "time out" was called to verify the correct patient, procedure, equipment, support staff and site/side marked as required.  Preparation: Patient was prepped and draped in the usual sterile fashion.  Vein Location: right basilic  Ultrasound Guided  Gauge: 20  Normal blood return and flush without difficulty Patient tolerance: Patient tolerated the procedure well with no immediate complications.    Medications Ordered in ED Medications  nitroGLYCERIN 50 mg in dextrose 5 % 250 mL (0.2 mg/mL) infusion (not administered)  aspirin suppository 300 mg (not administered)  furosemide (LASIX) injection 80  mg (not administered)  bisacodyl (  DULCOLAX) suppository 10 mg (not administered)  chlorhexidine (PERIDEX) 0.12 % solution 15 mL (not administered)  MEDLINE mouth rinse (not administered)  albuterol (PROVENTIL) (2.5 MG/3ML) 0.083% nebulizer solution 5 mg (5 mg Nebulization Given 07/14/16 0559)     Initial Impression / Assessment and Plan / ED Course  I have reviewed the triage vital signs and the nursing notes.  Pertinent labs & imaging results that were available during my care of the patient were reviewed by me and considered in my medical decision making (see chart for details).     Patient presents with acute resp distress. Given acuity over past few hours and hypertension (initially over 947 systolic), this is likely pulmonary edema with hypertensive emergency. Was going to be started on nitro but difficult to get access on. Has improved significantly with BiPAP and now HTN is better, now in 170s. Will give lasix. Will need admission for further respiratory support and diuresis.   Final Clinical Impressions(s) / ED Diagnoses   Final diagnoses:  Acute pulmonary edema [facility])    New Prescriptions New Prescriptions   No medications on file     Sherwood Gambler, MD 07/14/16 [phone]

## 2016-07-14 NOTE — ED Triage Notes (Signed)
Pt was discharged from hospital x 3 days ago and states she began to feel sob today; pt also states she has not had a BM in one week

## 2016-07-14 NOTE — Progress Notes (Signed)
Inpatient Diabetes Program Recommendations  AACE/ADA: New Consensus Statement on Inpatient Glycemic Control (2015)  Target Ranges:  Prepandial:   less than 140 mg/dL      Peak postprandial:   less than 180 mg/dL (1-2 hours)      Critically ill patients:  140 - 180 mg/dL  Results for [name], [name] (MRN [id number]) as of 07/14/2016 07:28  Ref. Range 07/14/2016 06:32  Glucose Latest Ref Range: 65 - 99 mg/dL 431 (H)   Results for [name], [name] (MRN [id number]) as of 07/14/2016 07:28  Ref. Range 07/14/2016 05:56  Glucose-Capillary Latest Ref Range: 65 - 99 mg/dL 391 (H)  Results for [name], [name] (MRN [id number]) as of 07/14/2016 07:28  Ref. Range 07/09/2016 14:40  Hemoglobin A1C Latest Ref Range: 4.8 - 5.6 % 10.3 (H)   Review of Glycemic Control  Diabetes history:DM2 Outpatient Diabetes medications: Tresiba 40 units QHS, Humalog 15-21 units TID with meals Current orders for Inpatient glycemic control: None; in ED and to be admitted   Inpatient Diabetes Program Recommendations: Insulin - Basal: Please consider ordering Lantus to 20 units daily to start now. Correction (SSI): Please consider ordering Novolog 0-15 units TID with meals (to start now) and Novolog 0-5 units QHS for bedtime correction. If patient will be NPO, please order Novolog 0-15 units Q4H. Insulin - Meal Coverage:If diet ordered, please consider ordering Novolog 5 units TID with meals for meal coverage if patient eats at least 50% of meals. HgbA1C:A1C 10.3% on 07/09/16 indicating an average glucose of 249 mg/dl over the past 2-3 months. Patient needs to follow up with Dr. Dorris Fetch regarding improving DM control.  Thanks, Barnie Alderman, RN, MSN, CDE Diabetes Coordinator Inpatient Diabetes Program [phone] (Team Pager from 8am to 5pm)

## 2016-07-14 NOTE — ED Notes (Signed)
CRITICAL VALUE ALERT  Critical value received:  Troponin/0.08  Date of notification:  07/14/16  Time of notification:  0716  Critical value read back:Yes.    Nurse who received alert:  Pattricia Boss, RN  MD notified (1st page):  Dr Regenia Skeeter  Time of first page:  [phone]

## 2016-07-14 NOTE — Progress Notes (Signed)
Patient states she does not want to go on BIPAP tonight. Says she feels fine on her 72L02. RT will continue to monitor.

## 2016-07-14 NOTE — H&P (Signed)
TRH H&P   Patient Demographics:    [name], is a 71 y.o. female  MRN: [id number]   DOB - Aug 24, 1945  Admit Date - 07/14/2016  Outpatient Primary MD for the patient is [name], MD  Referring MD/NP/PA: dr Regenia Skeeter  Patient coming from: Home  Chief Complaint  Patient presents with  . Shortness of Breath      HPI:    [name]  is a 71 y.o. female, With history of CHF, chronic kidney disease stage III, diabetes mellitus, hypertension Who was recently discharged on 07/12/2016 to continue to pneumonia, as is with complaint of shortness of breath, patient reports yesterday evening, she started to develop some dyspnea, does report some cough, which has been for a few days, but a productive white sputum, she denies any fever or chills, dyspnea has been progressive overnight, was not able to lay supine, which prompted her to come to the ED this a.m., she was mildly hypoxic at 89%, but she was significantly dyspneic with increased work of breathing where she required BiPAP, she denies any chest pain, diuresis, reports some lower extremity edema, which has been at baseline, noticed to have significantly elevated blood pressure in ED with systolic 517, report she has been compliant with fluid restriction, salt restricted diet and her medication and daughters at bedside confirmed that, chest x-ray significant for vascular congestion, I was called to admit.    Review of systems:    In addition to the HPI above,  No Fever-chills, No Headache, No changes with Vision or hearing, No problems swallowing food or Liquids, No Chest pain, Complains of cough with white productive sputum, and worsening dyspnea No Abdominal pain, No Nausea or Vommitting, she reports constipation No Blood in stool or Urine, No dysuria, No new skin rashes or bruises, No new joints pains-aches,  No new  weakness, tingling, numbness in any extremity, No recent weight gain or loss, No polyuria, polydypsia or polyphagia, No significant Mental Stressors.  A full 10 point Review of Systems was done, except as stated above, all other Review of Systems were negative.   With Past History of the following :    Past Medical History:  Diagnosis Date  . Acute CHF (congestive heart failure) ([place]) 07/04/2015  . Acute renal failure superimposed on stage 4 chronic kidney disease ([place]) 07/04/2015  . Allergic rhinitis   . Anxiety   . Constipation   . Diabetes mellitus   . GERD (gastroesophageal reflux disease)   . Heart murmur, systolic   . History of hysterectomy   . Hyperlipidemia   . Hypertension   . Low back pain   . Obesity   . Osteoarthritis   . Overactive bladder       Past Surgical History:  Procedure Laterality Date  . ABDOMINAL HYSTERECTOMY    . BACK SURGERY     multiple  . CARDIAC CATHETERIZATION  04/04/2006   Est EF of 60%  . CARDIAC CATHETERIZATION N/A 07/04/2015   Procedure: Left Heart Cath and Coronary Angiography;  Surgeon: Troy Sine, MD;  Location: [facility];  Service: Cardiovascular;  Laterality: N/A;  . CARDIAC CATHETERIZATION N/A 07/04/2015   Procedure: Coronary Stent Intervention;  Surgeon: Troy Sine, MD;  Location: [facility];  Service: Cardiovascular;  Laterality: N/A;  . CARDIAC CATHETERIZATION N/A 10/13/2015   Procedure: Left Heart Cath and Coronary Angiography;  Surgeon: Troy Sine, MD;  Location: [facility];  Service: Cardiovascular;  Laterality: N/A;  . CARDIAC CATHETERIZATION N/A 12/02/2015   Procedure: Left Heart Cath and Coronary Angiography;  Surgeon: Peter M Martinique, MD;  Location: [facility];  Service: Cardiovascular;  Laterality: N/A;  . CARPAL TUNNEL RELEASE    . CERVICAL BIOPSY     cervical lymph node biopsies  . COLONOSCOPY  May 2002   Dr. Irving Shows :Followup in 5 years, normal exam  . COLONOSCOPY  2008    Dr. Laural Golden: Very redundant colon with mild melanosis coli, splenic flexure polyp biopsy with acute complaint of benign colon polyp. Recommended ten-year followup  . CORONARY STENT PLACEMENT  04/11/2006   2 -- Taxus stents to the circumflex   . PERIPHERAL VASCULAR CATHETERIZATION Right 07/09/2015   Procedure: Upper Extremity Angiography;  Surgeon: Conrad Correll, MD;  Location: [facility];  Service: Cardiovascular;  Laterality: Right;  . PERIPHERAL VASCULAR CATHETERIZATION Right 07/10/2015   Procedure: RIGHT SUBCLAVIAN ARTERY THROMBECTOMY;  Surgeon: Serafina Mitchell, MD;  Location: [facility];  Service: Vascular;  Laterality: Right;  . tendonitis     bilateral elbow  . TRIGGER FINGER RELEASE        Social History:     Social History  Substance Use Topics  . Smoking status: Never Smoker  . Smokeless tobacco: Never Used  . Alcohol use No     Lives - At home  Mobility - with assistance    Family History :     Family History  Problem Relation Age of Onset  . Diabetes Father   . Hypertension Father   . Stroke Father   . Hypertension Brother   . Aneurysm Brother   . Diabetes Brother   . Colon cancer Neg Hx       Home Medications:   Prior to Admission medications   Medication Sig Start Date End Date Taking? Authorizing Provider  acetaminophen (TYLENOL) 500 MG tablet Take 2 tablets (1,000 mg total) by mouth every 6 (six) hours as needed (pain). 07/12/15   Lily Kocher, MD  albuterol (PROVENTIL HFA;VENTOLIN HFA) [phone] Base) MCG/ACT inhaler Inhale 1-2 puffs into the lungs every 6 (six) hours as needed for wheezing or shortness of breath. 04/01/16   Varney Biles, MD  ALPRAZolam Duanne Moron) 0.5 MG tablet Take 0.5 mg by mouth as needed for anxiety or sleep.     [provider]  amoxicillin-clavulanate (AUGMENTIN) 875-125 MG tablet Take 1 tablet by mouth 2 (two) times daily. 07/12/16 07/17/16  Erline Hau, MD  aspirin EC 81 MG tablet Take 81 mg by mouth daily.     [provider]  calcitRIOL (ROCALTROL) 0.25 MCG capsule Take 0.25 mcg by mouth every Monday, Wednesday, and Friday.  03/23/15   [provider]  ferrous sulfate 325 (65 FE) MG tablet Take 325 mg by mouth daily.    [provider]  hydrALAZINE (APRESOLINE) 25 MG tablet Take  2 tablets (50 mg total) by mouth 3 (three) times daily. 01/21/16   Herminio Commons, MD  insulin degludec (TRESIBA) 100 UNIT/ML SOPN FlexTouch Pen Inject 40 Units into the skin daily at 10 pm.    [provider]  insulin lispro (HUMALOG KWIKPEN) 100 UNIT/ML KiwkPen Inject 0.15-0.21 mLs (15-21 Units total) into the skin 3 (three) times daily. 03/22/16   Cassandria Anger, MD  isosorbide mononitrate (IMDUR) 60 MG 24 hr tablet Take 1 tablet (60 mg total) by mouth daily. 07/12/15   Lily Kocher, MD  latanoprost (XALATAN) 0.005 % ophthalmic solution Place 1 drop into both eyes at bedtime.  10/25/11   [provider]  meclizine (ANTIVERT) 25 MG tablet Take 1 tablet by mouth daily. 03/06/16   [provider]  metoprolol succinate (TOPROL-XL) 50 MG 24 hr tablet TAKE ONE TABLET BY MOUTH TWICE DAILY WITH  OR  IMMEDIATELY  FOLLOWING  A  MEAL 04/28/16   Lyda Jester M, PA-C  nitroGLYCERIN (NITROSTAT) 0.4 MG SL tablet Place 1 tablet (0.4 mg total) under the tongue every 5 (five) minutes as needed for chest pain. 10/14/15   Lyda Jester M, PA-C  potassium chloride SA (K-DUR,KLOR-CON) 20 MEQ tablet Take 2 tablets (40 mEq total) by mouth daily. 10/14/15   Rosita Fire, Brittainy M, PA-C  RANEXA 500 MG 12 hr tablet TAKE ONE TABLET BY MOUTH TWICE DAILY 05/04/16   Troy Sine, MD  rosuvastatin (CRESTOR) 40 MG tablet Take 1 tablet (40 mg total) by mouth at bedtime. 07/12/15   Lily Kocher, MD  ticagrelor (BRILINTA) 90 MG TABS tablet Take 1 tablet (90 mg total) by mouth 2 (two) times daily. 10/14/15   Lyda Jester M, PA-C  torsemide (DEMADEX) 20 MG tablet Take 3 tabs (60mg ) by mouth  every morning & 1 tab (20mg ) mid day, can take an extra 20mg  daily if her weight increases by >3 pounds. 12/03/15   Arbutus Leas, NP     Allergies:     Allergies  Allergen Reactions  . Ace Inhibitors Cough    Pt can tolerate Tribenzor (and ARBs)  . Codeine Rash     Physical Exam:   Vitals  Blood pressure (!) 177/85, pulse 96, temperature 98.2 F (36.8 C), temperature source Oral, resp. rate (!) 24, height 5\' 11"  (1.803 m), weight 90.3 kg (199 lb), SpO2 100 %.   1. General Chronically ill appearing frail elderly female lying in bed in mild respiratory distress using BiPAP .  2. Normal affect and insight, Not Suicidal or Homicidal, Awake Alert, Oriented X 3.  3. No F.N deficits, ALL C.Nerves Intact, Strength 5/5 all 4 extremities, Sensation intact all 4 extremities, Plantars down going.  4. Ears and Eyes appear Normal, Conjunctivae clear, PERRLA. Moist Oral Mucosa.  5. Supple Neck, mild  JVD, No Carotid Bruits.  6. Symmetrical Chest wall movement, bibasilar crackles, no wheezing, some use of accessory muscle, using BiPAP  7. RRR, No Gallops, Rubs or Murmurs, No Parasternal Heave. +1 edema bilaterally  8. Positive Bowel Sounds, Abdomen Soft, No tenderness, No organomegaly appriciated,No rebound -guarding or rigidity.  9.  No Cyanosis, Normal Skin Turgor, No Skin Rash or Bruise.  10. Good muscle tone,  joints appear normal , no effusions, Normal ROM.  11. No Palpable Lymph Nodes in Neck or Axillae     Data Review:    CBC  Recent Labs Lab 07/09/16 0402 07/10/16 0521 07/14/16 0632  WBC 7.8 6.2 7.8  HGB 12.6 10.0* 12.8  HCT 39.7 31.3* 39.6  PLT 201 168 198  MCV 103.1* 101.3* 101.3*  MCH 32.7 32.4 32.7  MCHC 31.7 31.9 32.3  RDW 14.9 14.6 14.6  LYMPHSABS 2.1  --  2.0  MONOABS 0.3  --  0.4  EOSABS 0.1  --  0.1  BASOSABS 0.1  --  0.0    ------------------------------------------------------------------------------------------------------------------  Chemistries   Recent Labs Lab 07/09/16 0402 07/09/16 1440 07/10/16 0521 07/14/16 0632  NA 133* 138 135 135  K 4.6 3.5 3.5 4.0  CL 92* 96* 96* 94*  CO2 28 30 32 26  GLUCOSE 617* 173* 252* 431*  BUN 65* 63* 57* 50*  CREATININE 2.64* 2.60* 2.36* 2.28*  CALCIUM 10.2 9.8 9.1 10.0  AST 39  --  26 35  ALT 31  --  19 29  ALKPHOS 102  --  68 102  BILITOT 0.8  --  0.5 0.7   ------------------------------------------------------------------------------------------------------------------ estimated creatinine clearance is 28.1 mL/min (A) (by C-G formula based on SCr of 2.28 mg/dL (H)). ------------------------------------------------------------------------------------------------------------------ No results for input(s): TSH, T4TOTAL, T3FREE, THYROIDAB in the last 72 hours.  Invalid input(s): FREET3  Coagulation profile No results for input(s): INR, PROTIME in the last 168 hours. ------------------------------------------------------------------------------------------------------------------- No results for input(s): DDIMER in the last 72 hours. -------------------------------------------------------------------------------------------------------------------  Cardiac Enzymes  Recent Labs Lab 07/09/16 1440 07/09/16 2045 07/14/16 0632  TROPONINI 0.72* 0.61* 0.08*   ------------------------------------------------------------------------------------------------------------------    Component Value Date/Time   BNP 345.0 (H) 07/14/2016 0632     ---------------------------------------------------------------------------------------------------------------  Urinalysis    Component Value Date/Time   COLORURINE YELLOW 03/20/2016 1500   APPEARANCEUR CLEAR 03/20/2016 1500   LABSPEC 1.011 03/20/2016 1500   PHURINE 6.0 03/20/2016 1500   GLUCOSEU 150 (A)  03/20/2016 1500   HGBUR NEGATIVE 03/20/2016 1500   BILIRUBINUR NEGATIVE 03/20/2016 1500   KETONESUR NEGATIVE 03/20/2016 1500   PROTEINUR 30 (A) 03/20/2016 1500   UROBILINOGEN 0.2 08/23/2007 2037   NITRITE NEGATIVE 03/20/2016 1500   LEUKOCYTESUR NEGATIVE 03/20/2016 1500    ----------------------------------------------------------------------------------------------------------------   Imaging Results:    Dg Chest Portable 1 View  Result Date: 07/14/2016 CLINICAL DATA:  71 y/o  F; acute dyspnea. EXAM: PORTABLE CHEST 1 VIEW COMPARISON:  07/09/2016 chest radiograph FINDINGS: Increased hazy opacification of the lungs and lower lobe consolidations probably representing pulmonary edema. Cardiac silhouette is obscured by airspace disease. Underlying pneumonia is not excluded. No acute osseous abnormality identified. IMPRESSION: Increased hazy opacification of the lungs and lower lobe consolidations probably representing pulmonary edema. Cardiac silhouette is obscured by airspace disease. Underlying pneumonia is not excluded. Electronically Signed   By: Kristine Garbe M.D.   On: 07/14/2016 06:31    My personal review of EKG: Rhythm NSR, Rate  104 /min, QTc 457 , no Acute ST changes, but all incomplete left bundle branch block   Assessment & Plan:    Active Problems:   Hyperlipemia   Essential hypertension   GERD   Constipation   Type 2 diabetes mellitus with stage 4 chronic kidney disease (HCC)   Acute on chronic combined systolic and diastolic CHF (congestive heart failure) (HCC)   Elevated troponin   Acute respiratory failure with hypoxia (HCC)   Pulmonary edema   CKD (chronic kidney disease), stage IV (HCC)   Acute respiratory failure - Mildly hypoxic on presentation, but with significant increase work of breathing, this is due to pulmonary edema and volume overload, will continue with BiPAP currently as she is being diuresed, and they will try to wean off  as  tolerated.  Acute on chronic combined CHF - Most recent echo 07/11/2016 with EF 45-50%, with severe hypokinesis/akinesis, with no significant change, as well as grade 2 diastolic dysfunction - Admitted under CHF pathway, continue to monitor daily weights, strict ins and outs, will have her on IV Lasix 80 mg 3 times a day, will monitor electrolytes and renal function closely, continue with beta blockers, cannot tolerate ACEI/ARB given her renal failure, but will continue with hydralazine and Imdur.  Hypertensive urgency - Blood pressure significantly uncontrolled on presentation, this is most likely contributing to her symptoms, has improved before initiating nitroglycerin drip, sore continue with home medication, will add when necessary hydralazine.  Elevated troponins - She denies any chest pain, this is most likely related to demand ischemia in the setting of hypertensive urgency and dyspnea, as well it is elevated at baseline in the setting of CKD, actually it is much better than her most recent value last week of 0.61  Pneumonia -  she is afebrile, with no leukocytosis, will continue her on Augmentin until she finishes her course from previous discharge  Diabetes mellitus - Will substitute T[place] with Lantus, and tinea with insulin sliding scale as well  Stage IV CK D - At baseline, continue to monitor as she is on diuresis  Constipation - Continue with bowel regimen  DVT Prophylaxis Heparin -  SCDs  AM Labs Ordered, also please review Full Orders  Family Communication: Admission, patients condition and plan of care including tests being ordered have been discussed with the patient and both daughters at bedside who indicate understanding and agree with the plan and Code Status.  Code Status Full  Likely DC to  Home  Condition GUARDED    Consults called: None   Admission status: Observation   Time spent in minutes :65 minutes   Vollie Aaron M.D on 07/14/2016 at 7:56  AM  Between 7am to 7pm - Pager - [phone]. After 7pm go to [email or web address] - password [facility]  [facility] - Office  [phone]

## 2016-07-15 ENCOUNTER — Observation Stay (HOSPITAL_COMMUNITY): Payer: BLUE CROSS/BLUE SHIELD

## 2016-07-15 ENCOUNTER — Telehealth: Payer: Self-pay | Admitting: Cardiovascular Disease

## 2016-07-15 ENCOUNTER — Telehealth: Payer: Self-pay | Admitting: *Deleted

## 2016-07-15 DIAGNOSIS — J9601 Acute respiratory failure with hypoxia: Secondary | ICD-10-CM | POA: Diagnosis present

## 2016-07-15 DIAGNOSIS — I5043 Acute on chronic combined systolic (congestive) and diastolic (congestive) heart failure: Secondary | ICD-10-CM | POA: Diagnosis present

## 2016-07-15 DIAGNOSIS — Z7982 Long term (current) use of aspirin: Secondary | ICD-10-CM | POA: Diagnosis not present

## 2016-07-15 DIAGNOSIS — Z794 Long term (current) use of insulin: Secondary | ICD-10-CM | POA: Diagnosis not present

## 2016-07-15 DIAGNOSIS — Z7902 Long term (current) use of antithrombotics/antiplatelets: Secondary | ICD-10-CM | POA: Diagnosis not present

## 2016-07-15 DIAGNOSIS — E785 Hyperlipidemia, unspecified: Secondary | ICD-10-CM | POA: Diagnosis present

## 2016-07-15 DIAGNOSIS — R000 Tachycardia, unspecified: Secondary | ICD-10-CM | POA: Diagnosis present

## 2016-07-15 DIAGNOSIS — Z6827 Body mass index (BMI) 27.0-27.9, adult: Secondary | ICD-10-CM | POA: Diagnosis not present

## 2016-07-15 DIAGNOSIS — J189 Pneumonia, unspecified organism: Secondary | ICD-10-CM | POA: Diagnosis present

## 2016-07-15 DIAGNOSIS — K219 Gastro-esophageal reflux disease without esophagitis: Secondary | ICD-10-CM | POA: Diagnosis present

## 2016-07-15 DIAGNOSIS — I2510 Atherosclerotic heart disease of native coronary artery without angina pectoris: Secondary | ICD-10-CM | POA: Diagnosis present

## 2016-07-15 DIAGNOSIS — I248 Other forms of acute ischemic heart disease: Secondary | ICD-10-CM | POA: Diagnosis present

## 2016-07-15 DIAGNOSIS — F419 Anxiety disorder, unspecified: Secondary | ICD-10-CM | POA: Diagnosis present

## 2016-07-15 DIAGNOSIS — N184 Chronic kidney disease, stage 4 (severe): Secondary | ICD-10-CM | POA: Diagnosis present

## 2016-07-15 DIAGNOSIS — I252 Old myocardial infarction: Secondary | ICD-10-CM | POA: Diagnosis not present

## 2016-07-15 DIAGNOSIS — I10 Essential (primary) hypertension: Secondary | ICD-10-CM | POA: Diagnosis not present

## 2016-07-15 DIAGNOSIS — E1122 Type 2 diabetes mellitus with diabetic chronic kidney disease: Secondary | ICD-10-CM | POA: Diagnosis present

## 2016-07-15 DIAGNOSIS — Z955 Presence of coronary angioplasty implant and graft: Secondary | ICD-10-CM | POA: Diagnosis not present

## 2016-07-15 DIAGNOSIS — E669 Obesity, unspecified: Secondary | ICD-10-CM | POA: Diagnosis present

## 2016-07-15 DIAGNOSIS — K5900 Constipation, unspecified: Secondary | ICD-10-CM | POA: Diagnosis present

## 2016-07-15 DIAGNOSIS — I130 Hypertensive heart and chronic kidney disease with heart failure and stage 1 through stage 4 chronic kidney disease, or unspecified chronic kidney disease: Secondary | ICD-10-CM | POA: Diagnosis present

## 2016-07-15 DIAGNOSIS — R0602 Shortness of breath: Secondary | ICD-10-CM | POA: Diagnosis present

## 2016-07-15 DIAGNOSIS — I160 Hypertensive urgency: Secondary | ICD-10-CM | POA: Diagnosis present

## 2016-07-15 LAB — GLUCOSE, CAPILLARY
GLUCOSE-CAPILLARY: 257 mg/dL — AB (ref 65–99)
Glucose-Capillary: 364 mg/dL — ABNORMAL HIGH (ref 65–99)
Glucose-Capillary: 279 mg/dL — ABNORMAL HIGH (ref 65–99)
Glucose-Capillary: 253 mg/dL — ABNORMAL HIGH (ref 65–99)
Glucose-Capillary: 176 mg/dL — ABNORMAL HIGH (ref 65–99)

## 2016-07-15 LAB — BASIC METABOLIC PANEL
ANION GAP: 11 (ref 5–15)
BUN: 55 mg/dL — AB (ref 6–20)
CHLORIDE: 99 mmol/L — AB (ref 101–111)
CO2: 28 mmol/L (ref 22–32)
Calcium: 9.3 mg/dL (ref 8.9–10.3)
Creatinine, Ser: 2.28 mg/dL — ABNORMAL HIGH (ref 0.44–1.00)
GFR calc Af Amer: 24 mL/min — ABNORMAL LOW (ref >60)
GFR calc non Af Amer: 20 mL/min — ABNORMAL LOW (ref >60)
Glucose, Bld: 180 mg/dL — ABNORMAL HIGH (ref 65–99)
POTASSIUM: 4.1 mmol/L (ref 3.5–5.1)
SODIUM: 138 mmol/L (ref 135–145)

## 2016-07-15 MED ORDER — INSULIN GLARGINE 100 UNIT/ML ~~LOC~~ SOLN
5.0000 [IU] | Freq: Once | SUBCUTANEOUS | Status: AC
  Administered 2016-07-15: 5 [IU] via SUBCUTANEOUS
  Filled 2016-07-15: qty 0.05

## 2016-07-15 MED ORDER — BISACODYL 10 MG RE SUPP
10.0000 mg | Freq: Once | RECTAL | Status: AC
  Administered 2016-07-15: 10 mg via RECTAL
  Filled 2016-07-15: qty 1

## 2016-07-15 MED ORDER — INSULIN ASPART 100 UNIT/ML ~~LOC~~ SOLN
5.0000 [IU] | Freq: Three times a day (TID) | SUBCUTANEOUS | Status: DC
  Administered 2016-07-15 – 2016-07-16 (×3): 5 [IU] via SUBCUTANEOUS

## 2016-07-15 MED ORDER — INSULIN GLARGINE 100 UNIT/ML ~~LOC~~ SOLN
25.0000 [IU] | Freq: Every day | SUBCUTANEOUS | Status: DC
  Administered 2016-07-16: 25 [IU] via SUBCUTANEOUS
  Filled 2016-07-15 (×3): qty 0.25

## 2016-07-15 NOTE — Telephone Encounter (Signed)
Allen with Physical Therapy from [facility] called stating that he put an email into system attention Dr. Bronson Ing.  He would like to speak with Dr. Bronson Ing   [phone].

## 2016-07-15 NOTE — Progress Notes (Signed)
PROGRESS NOTE                                                                                                                                                                                                             Patient Demographics:    [name], is a 71 y.o. female, DOB - 12/27/1945, KNL:[id number]  Admit date - 07/14/2016   Admitting Physician Albertine Taralynn, MD  Outpatient Primary MD for the patient is [name], MD  LOS - 0   Chief Complaint  Patient presents with  . Shortness of Breath       Brief Narrative   71 y.o. female, With history of CHF, chronic kidney disease stage III, diabetes mellitus, hypertension Who was recently discharged on 5/8/2018Secondary to pneumonia, presents with dyspnea, workup significant for hypertensive urgency and CHF .   Subjective:    [name] today Reports she is feeling much better today, but still dyspneic upon laying supine overnight, as well on ambulation, but no need of BiPAP over last 24 hours, denies any chest pain, there is still complains of cough, mildly productive .   Assessment  & Plan :    Active Problems:   Hyperlipemia   Essential hypertension   GERD   Constipation   Type 2 diabetes mellitus with stage 4 chronic kidney disease (HCC)   Acute on chronic combined systolic and diastolic CHF (congestive heart failure) (HCC)   Elevated troponin   Acute respiratory failure with hypoxia (HCC)   Pulmonary edema   CKD (chronic kidney disease), stage IV (HCC)   Acute on chronic combined CHF - Most recent echo 07/11/2016 with EF 45-50%, with severe hypokinesis/akinesis, with no significant change, as well as grade 2 diastolic dysfunction - Appears to be improving with IV diuresis, she is -1.9 liters since admission, will continue with current dose of IV Lasix today, giving still evidence of volume overload with bibasilar crackles on physical exam, and lower extremity edema,  , hopefully can transition to by mouth diuresis in the next 44 hours, continue with daily weight . - continue with beta blockers, cannot tolerate ACEI/ARB given her renal failure, but will continue with hydralazine and Imdur.  Acute respiratory failure - Continue to respiratory distress, requiring BiPAP initially, currently resolved, tolerating room air.  Hypertension - Initially antihypertensive on presentation, but currently blood  pressure is acceptable on home regimen, continue with no changes for today.  Elevated troponins - She denies any chest pain, this is most likely related to demand ischemia in the setting of hypertensive urgency and dyspnea, as well it is elevated at baseline in the setting of CKD, actually it is much better than her most recent value last week of 0.61  Pneumonia -  she is afebrile, with no leukocytosis, continue with Augmentin  Diabetes mellitus - Uncontrolled, will increase Lantus to 25 units today, continue with insulin sliding scale, will add NovoLog 5 units before meals  Stage IV CK D - kidney to monitor closely, so far appears stable on diuresis  Constipation - At good bowel movement with laxative  Code Status : Full  Family Communication  : Daughter at bedside  Disposition Plan  : Home in 1-2 days  Consults  :  None  Procedures  : None  DVT Prophylaxis  :   Heparin - SCDs  Lab Results  Component Value Date   PLT 198 07/14/2016    Antibiotics  :    Anti-infectives    Start     Dose/Rate Route Frequency Ordered Stop   07/14/16 1000  amoxicillin-clavulanate (AUGMENTIN) 875-125 MG per tablet 1 tablet     1 tablet Oral 2 times daily 07/14/16 0955          Objective:   Vitals:   07/15/16 0800 07/15/16 0900 07/15/16 1100 07/15/16 1300  BP:   131/60 (!) 125/55  Pulse: 63 68 66 70  Resp: 15 18    Temp:   98.2 F (36.8 C)   TempSrc:   Oral   SpO2: 100% 95% 99% 100%  Weight:      Height:        Wt Readings from Last 3  Encounters:  07/15/16 89.2 kg (196 lb 10.4 oz)  07/10/16 90.5 kg (199 lb 8.3 oz)  05/09/16 91.6 kg (202 lb)     Intake/Output Summary (Last 24 hours) at 07/15/16 1622 Last data filed at 07/15/16 1100  Gross per 24 hour  Intake              720 ml  Output             1601 ml  Net             -881 ml     Physical Exam  Awake Alert, Oriented X 3, No new F.N deficits, Normal affect Mild JVD,  Symmetrical Chest wall movement, Good air movement bilaterally, bibasilar crackles(improving) RRR,No Gallops,Rubs or new Murmurs, No Parasternal Heave +ve B.Sounds, Abd Soft, No tenderness, No rebound - guarding or rigidity. No Cyanosis, Clubbing , No new Rash or bruise  , +1 edema    Data Review:    CBC  Recent Labs Lab 07/09/16 0402 07/10/16 0521 07/14/16 0632  WBC 7.8 6.2 7.8  HGB 12.6 10.0* 12.8  HCT 39.7 31.3* 39.6  PLT 201 168 198  MCV 103.1* 101.3* 101.3*  MCH 32.7 32.4 32.7  MCHC 31.7 31.9 32.3  RDW 14.9 14.6 14.6  LYMPHSABS 2.1  --  2.0  MONOABS 0.3  --  0.4  EOSABS 0.1  --  0.1  BASOSABS 0.1  --  0.0    Chemistries   Recent Labs Lab 07/09/16 0402 07/09/16 1440 07/10/16 0521 07/14/16 0632 07/15/16 0502  NA 133* 138 135 135 138  K 4.6 3.5 3.5 4.0 4.1  CL 92* 96* 96* 94* 99*  CO2  28 30 32 26 28  GLUCOSE 617* 173* 252* 431* 180*  BUN 65* 63* 57* 50* 55*  CREATININE 2.64* 2.60* 2.36* 2.28* 2.28*  CALCIUM 10.2 9.8 9.1 10.0 9.3  AST 39  --  26 35  --   ALT 31  --  19 29  --   ALKPHOS 102  --  68 102  --   BILITOT 0.8  --  0.5 0.7  --    ------------------------------------------------------------------------------------------------------------------ No results for input(s): CHOL, HDL, LDLCALC, TRIG, CHOLHDL, LDLDIRECT in the last 72 hours.  Lab Results  Component Value Date   HGBA1C 10.3 (H) 07/09/2016   ------------------------------------------------------------------------------------------------------------------ No results for input(s): TSH,  T4TOTAL, T3FREE, THYROIDAB in the last 72 hours.  Invalid input(s): FREET3 ------------------------------------------------------------------------------------------------------------------ No results for input(s): VITAMINB12, FOLATE, FERRITIN, TIBC, IRON, RETICCTPCT in the last 72 hours.  Coagulation profile No results for input(s): INR, PROTIME in the last 168 hours.  No results for input(s): DDIMER in the last 72 hours.  Cardiac Enzymes  Recent Labs Lab 07/09/16 1440 07/09/16 2045 07/14/16 0632  TROPONINI 0.72* 0.61* 0.08*   ------------------------------------------------------------------------------------------------------------------    Component Value Date/Time   BNP 345.0 (H) 07/14/2016 6378    Inpatient Medications  Scheduled Meds: . amoxicillin-clavulanate  1 tablet Oral BID  . aspirin EC  81 mg Oral Daily  . bisacodyl  10 mg Rectal Once  . calcitRIOL  0.25 mcg Oral Q M,W,F  . chlorhexidine  15 mL Mouth Rinse BID  . ferrous sulfate  325 mg Oral Daily  . furosemide  80 mg Intravenous Once  . furosemide  80 mg Intravenous TID  . heparin  5,000 Units Subcutaneous Q8H  . hydrALAZINE  50 mg Oral TID  . insulin aspart  0-5 Units Subcutaneous QHS  . insulin aspart  0-9 Units Subcutaneous TID WC  . insulin aspart  5 Units Subcutaneous TID WC  . [START ON 07/16/2016] insulin glargine  25 Units Subcutaneous Daily  . insulin glargine  5 Units Subcutaneous Once  . isosorbide mononitrate  60 mg Oral Daily  . latanoprost  1 drop Both Eyes QHS  . meclizine  25 mg Oral Daily  . mouth rinse  15 mL Mouth Rinse q12n4p  . metoprolol succinate  50 mg Oral BID  . potassium chloride SA  40 mEq Oral Daily  . ranolazine  500 mg Oral BID  . rosuvastatin  40 mg Oral QHS  . sodium chloride flush  3 mL Intravenous Q12H  . ticagrelor  90 mg Oral BID   Continuous Infusions: . sodium chloride     PRN Meds:.sodium chloride, acetaminophen, ALPRAZolam, hydrALAZINE, nitroGLYCERIN,  ondansetron (ZOFRAN) IV, sodium chloride flush  Micro Results Recent Results (from the past 240 hour(s))  MRSA PCR Screening     Status: None   Collection Time: 07/09/16  8:45 AM  Result Value Ref Range Status   MRSA by PCR NEGATIVE NEGATIVE Final    Comment:        The GeneXpert MRSA Assay (FDA approved for NASAL specimens only), is one component of a comprehensive MRSA colonization surveillance program. It is not intended to diagnose MRSA infection nor to guide or monitor treatment for MRSA infections.   Culture, blood (Routine X 2) w Reflex to ID Panel     Status: None   Collection Time: 07/09/16 10:22 AM  Result Value Ref Range Status   Specimen Description BLOOD BLOOD LEFT FOREARM  Final   Special Requests   Final  BOTTLES DRAWN AEROBIC AND ANAEROBIC Blood Culture results may not be optimal due to an inadequate volume of blood received in culture bottles   Culture NO GROWTH 5 DAYS  Final   Report Status 07/14/2016 FINAL  Final  Culture, blood (Routine X 2) w Reflex to ID Panel     Status: None   Collection Time: 07/09/16  2:47 PM  Result Value Ref Range Status   Specimen Description RIGHT ANTECUBITAL  Final   Special Requests   Final    BOTTLES DRAWN AEROBIC AND ANAEROBIC Blood Culture adequate volume   Culture NO GROWTH 5 DAYS  Final   Report Status 07/14/2016 FINAL  Final    Radiology Reports Dg Chest 2 View  Result Date: 07/15/2016 CLINICAL DATA:  Shortness of Breath, chest pain EXAM: CHEST  2 VIEW COMPARISON:  07/14/2016 FINDINGS: There are small to moderate bilateral pleural effusions. Bilateral lower lobe atelectasis or infiltrates. Aeration has improved since prior study. Heart is borderline in size. IMPRESSION: Small to moderate bilateral effusions with bibasilar opacities. Overall aeration and airspace disease improved since prior study. Electronically Signed   By: Rolm Baptise M.D.   On: 07/15/2016 10:16   Dg Chest Portable 1 View  Result Date:  07/14/2016 CLINICAL DATA:  71 y/o  F; acute dyspnea. EXAM: PORTABLE CHEST 1 VIEW COMPARISON:  07/09/2016 chest radiograph FINDINGS: Increased hazy opacification of the lungs and lower lobe consolidations probably representing pulmonary edema. Cardiac silhouette is obscured by airspace disease. Underlying pneumonia is not excluded. No acute osseous abnormality identified. IMPRESSION: Increased hazy opacification of the lungs and lower lobe consolidations probably representing pulmonary edema. Cardiac silhouette is obscured by airspace disease. Underlying pneumonia is not excluded. Electronically Signed   By: Kristine Garbe M.D.   On: 07/14/2016 06:31   Dg Chest Port 1 View  Result Date: 07/09/2016 CLINICAL DATA:  Dyspnea tonight EXAM: PORTABLE CHEST 1 VIEW COMPARISON:  04/01/2016 FINDINGS: Central and basilar airspace opacity bilaterally, right greater than left. In the right base there are air bronchograms. This may represent pneumonia. No large effusion. IMPRESSION: Dense consolidation in the right base with more patchy consolidation elsewhere in the central and basilar lungs bilaterally. This may represent pneumonia. Followup PA and lateral chest X-ray is recommended in 3-4 weeks following trial of antibiotic therapy to ensure resolution and exclude underlying malignancy. Electronically Signed   By: Andreas Newport M.D.   On: 07/09/2016 04:38     Raveena Hebdon M.D on 07/15/2016 at 4:22 PM  Between 7am to 7pm - Pager - [phone]  After 7pm go to [email or web address] - password [facility]  [facility] -  Office  [phone]

## 2016-07-15 NOTE — Progress Notes (Signed)
Inpatient Diabetes Program Recommendations  AACE/ADA: New Consensus Statement on Inpatient Glycemic Control (2015)  Target Ranges:  Prepandial:   less than 140 mg/dL      Peak postprandial:   less than 180 mg/dL (1-2 hours)      Critically ill patients:  140 - 180 mg/dL   Results for IMANI, FIEBELKORN (MRN [id number]) as of 07/15/2016 07:37  Ref. Range 07/14/2016 05:56 07/14/2016 11:58 07/14/2016 17:07 07/14/2016 21:12  Glucose-Capillary Latest Ref Range: 65 - 99 mg/dL 391 (H) 421 (H) 288 (H) 245 (H)   Review of Glycemic Control  Diabetes history:DM2 Outpatient Diabetes medications: Tresiba 40 units QHS, Humalog 15-21 units TID with meals Current orders for Inpatient glycemic control: Lantus 20 units daily, Novolog 0-9 units TID with meals, Novolog 0-5 units QHS  Inpatient Diabetes Program Recommendations: Insulin - Meal Coverage:Please consider ordering Novolog 5units TID with meals for meal coverage if patient eats at least 50% of meals. HgbA1C:A1C 10.3% on 07/09/16 indicating an average glucose of 249 mg/dl over the past 2-3 months. Patient needs to follow up with Dr. Dorris Fetch regarding improving DM control.  Thanks, Barnie Alderman, RN, MSN, CDE Diabetes Coordinator Inpatient Diabetes Program [phone] (Team Pager from 8am to 5pm)

## 2016-07-15 NOTE — Evaluation (Signed)
Physical Therapy Evaluation Patient Details Name: [name] MRN: [id number] DOB: September 24, 1945 Today's Date: 07/15/2016   History of Present Illness  Elisavet Buehrer is a 71yo black female who comes to [facility] on 5/10 with sudden worsenign SOB; she is admitted for Pulmonary Edema. She was Recently DC on 5/8 after having PNA. PMH: Chronic intermittent Rt knee pain, NSTEMI (2017), CHF, CKD-III, DM, HTN. BNP and BG elevated since arrival. troponin at 0.08, thought to be related to exertion in H&P note and notably lower than her baseline troponin.   Clinical Impression  Pt admitted with above diagnosis. Pt currently with functional limitations due to the deficits listed below (see "PT Problem List"). The pt is awake and agreeable to participate. No acute distress noted at this time, pt only reporting some mild-moderate dizziness upon standing that remains during AMB, pt normotensive but with elevated BG at last reading. SOB/DOE improved since arrival. VSS during eval. The pt is alert and oriented x3, pleasant, conversational, and following simple and multi-step commands consistently. Functional mobility assessment demonstrates mild-moderate weakness, the pt now requiring near maximal effort for transfers and gait, whereas the patient performs these independently at baseline. The patient reports to feel "quite a bit" weaker than her baseline functional status. The patient is at high risk for falls as evidence by gait speed <0.25m/s and forward reach <5". Pt will benefit from skilled PT intervention to increase independence and safety with basic mobility in preparation for discharge to the venue listed below.       Follow Up Recommendations Home health PT (With likely quick DC to be continued in OPPT thereafter)    Equipment Recommendations  Cane    Recommendations for Other Services       Precautions / Restrictions Precautions Precautions: Fall Restrictions Weight Bearing Restrictions: No       Mobility  Bed Mobility               General bed mobility comments: Received in chair   Transfers Overall transfer level: Needs assistance Equipment used: None Transfers: Sit to/from Stand Sit to Stand: Supervision         General transfer comment: near maximal effort required   Ambulation/Gait Ambulation/Gait assistance: Min guard Ambulation Distance (Feet): 50 Feet (rest at [place] point) Assistive device: None Gait Pattern/deviations: Staggering left;Staggering right Gait velocity: 0.48m/s  Gait velocity interpretation: <1.8 ft/sec, indicative of risk for recurrent falls General Gait Details: unsteady with drifting bilat. very slow, pt consistently dizzy throughout.   Stairs            Wheelchair Mobility    Modified Rankin (Stroke Patients Only)       Balance Overall balance assessment: Needs assistance         Standing balance support: No upper extremity supported;During functional activity Standing balance-[name]: Good                               Pertinent Vitals/Pain Pain Assessment: No/denies pain    Home Living Family/patient expects to be discharged to:: Private residence Living Arrangements: Children;Spouse/significant other (daughter is an amputee) Available Help at Discharge: Family;Available 24 hours/day;Friend(s) Type of Home: House Home Access: Ramped entrance     Home Layout: Two level;Able to live on main level with bedroom/bathroom;Full bath on main level;Laundry or work area in [place]: Bedside commode;Walker - 2 wheels      Prior Function Level of Independence: Independent  Comments: Works as a Teacher, 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[place], [place]        Extremity/Trunk Assessment   Upper Extremity Assessment Upper Extremity Assessment: Generalized weakness    Lower Extremity Assessment Lower Extremity Assessment: Generalized weakness       Communication    Communication: No difficulties  Cognition Arousal/Alertness: Awake/alert Behavior During Therapy: WFL for tasks assessed/performed Overall Cognitive Status: Within Functional Limits for tasks assessed                                        General Comments      Exercises     Assessment/Plan    PT Assessment Patient needs continued PT services  PT Problem List Decreased mobility;Decreased strength;Decreased range of motion;Decreased activity tolerance;Decreased balance       PT Treatment Interventions DME instruction;Functional mobility training;Therapeutic activities;Therapeutic exercise;Balance training;Patient/family education    PT Goals (Current goals can be found in the Care Plan section)  Acute Rehab PT Goals Patient Stated Goal: return to home, improve stregth so she can travel this summer PT Goal Formulation: With patient 71yo black female Potential to Achieve Goals: Good    Frequency Min 2X/week   Barriers to discharge        Co-evaluation               AM-PAC PT "6 Clicks" Daily Activity  Outcome Measure Difficulty turning over in bed (including adjusting bedclothes, sheets and blankets)?: A Little Difficulty moving from lying on back to sitting on the side of the bed? : A Little Difficulty sitting down on and standing up from a chair with arms (e.g., wheelchair, bedside commode, etc,.)?: A Little Help needed moving to and from a bed to chair (including a wheelchair)?: A Little Help needed walking in hospital room?: A Little Help needed climbing 3-5 steps with a railing? : A Lot 6 Click Score: 17    End of Session Equipment Utilized During Treatment: Gait belt Activity Tolerance: Patient tolerated treatment well;Patient limited by fatigue Patient left: in chair;with call bell/phone within reach;with nursing/sitter in room Nurse Communication: Mobility status;Other (comment) PT Visit Diagnosis: Unsteadiness on feet  (R26.81);Dizziness and giddiness (R42);Difficulty in walking, not elsewhere classified (R26.2)    Time: 1242-1310 PT Time Calculation (min) (ACUTE ONLY): 28 min   Charges:   PT Evaluation $PT Eval Moderate Complexity: 1 Procedure PT Treatments $Therapeutic Activity: 8-22 mins   PT G Codes:   PT G-Codes **NOT FOR INPATIENT CLASS** Functional Assessment Tool Used: AM-PAC 6 Clicks Basic Mobility Functional Limitation: Mobility: Walking and moving around Mobility: Walking and Moving Around Current Status (Y6599): At least 40 percent but less than 60 percent impaired, limited or restricted Mobility: Walking and Moving Around Goal Status [phone]): At least 20 percent but less than 40 percent impaired, limited or restricted   1:33 PM, 07/15/16 Etta Grandchild, PT, DPT Physical Therapist - [place] [phone] [facility])  [phone] (mobile)   Aldrich Lloyd C 07/15/2016, 1:30 PM

## 2016-07-15 NOTE — Patient Outreach (Signed)
[facility]) Care Management  07/15/2016  [name] [birth year] [id number]   Care Coordination  [facility] CM received [facility] referral on 07/13/16 to engage for transition of care calls and evaluate for monthly home visits. Please also assign a [facility] pharmacist to address patient expressed pharmacy cost needs that was sent on 07/12/16 by [facility] hospital liaison. [name] returned to [facility] ED on 07/14/16  For sob and was hospitalized for acute respiratory distress, acute on chronic chf and hypertensive urgency  As of 07/15/16 after daily monitoring of EPIC by [facility] CM,  [name] remains hospitalized at [facility] and 07/15/16 Inpatient Cm noted indicates a discharge scheduled for 07/16/16 home   Plan [facility] CM will attempt to engage with [name] after verifying [facility] services eligibility and discharge from hospital next week  Kimberly L. Lavina Hamman, RN, BSN, [place] Care Management [phone]

## 2016-07-15 NOTE — Care Management (Signed)
PCP   HILL, GERALD  Demographics  Comment    Last edited by  on at   Address: Home Phone: Work Futures trader:  [place] [phone]   [id number] -- --  SSN: Insurance: Marital Status: Religion:  [id number] [place] Married None  Patient Ethnicity & Race   Ethnic Group Patient Race  Not Hispanic or Latino Black or African American  Documents Filed to Patient   Power of Attorney Living Will Clinical Unknown Study Attachment Consent Form ABN Waiver After Visit Summary Lab Result Scan Code Status MyChart Status Advance Care Planning  Not on File Not on File Not on File Not on File Filed Not on File [name] FULL [Updated on 07/14/16 0955] Active Jump to the Activity  Admission Information   Attending Provider Admitting Provider Admission Type Admission Date/Time  Elgergawy, Silver Huguenin, MD Elgergawy, Silver Huguenin, MD Emergency 07/14/16 [phone]  Discharge Date Hospital Service Auth/Cert Status Service Area   Internal Medicine Incomplete [place]  Unit Room/Bed Admission Status   AP-ICCUP NURSING IC11/IC11-01 Admission (Confirmed)   Hospital Account   Name Acct ID Class Status Primary Coverage  [name], [name] [id number] Observation Open BLUE [place]      Guarantor Account (for [place] [id number])   Name Relation to [place]? Acct Type  [name] Self CHSA Yes Personal/Family  Address Phone    [place] [address] [phone])        Coverage Information (for Hospital Account [id number])   1. BLUE CROSS BLUE SHIELD/BCBS OTHER   F/O Payor/Plan Precert #  BLUE CROSS BLUE SHIELD/BCBS OTHER   Subscriber Subscriber #  [name], [name] [id number]  Address Phone  [address] [place], [place] [address] [phone]  2. [place] PART A AND B   F/O Payor/Plan Precert #  MEDICARE/MEDICARE PART A AND B   Subscriber Subscriber #  [name], [name]  [id number]  Address Phone  [address], [place] [address]

## 2016-07-15 NOTE — Care Management Note (Signed)
Case Management Note  Patient Details  Name: [name] MRN: [id number] Date of Birth: 05/17/1945  Subjective/Objective:    Adm with pulmonary edema. From home, lives with daughter, ind PTA. Has RW if needed. Recommended for [facility] PT. Patient lives in [place] and would like [facility].                 Action/Plan: CM faxed referral to [facility]. Anticipate DC 07/16/2016.    Expected Discharge Date:  07/16/16               Expected Discharge Plan:  [place]  In-House Referral:     Discharge planning Services  CM Consult  Post Acute Care Choice:    Choice offered to:  Patient, Adult Children  DME Arranged:    DME Agency:  [facility]  HH Arranged:  PT [facility] Agency:     Status of Service:  Completed, signed off  If discussed at [place] of Stay Meetings, dates discussed:    Additional Comments:  Abdikadir Fohl, Chauncey [place], RN 07/15/2016, 3:53 PM

## 2016-07-16 DIAGNOSIS — N184 Chronic kidney disease, stage 4 (severe): Secondary | ICD-10-CM | POA: Diagnosis not present

## 2016-07-16 DIAGNOSIS — I10 Essential (primary) hypertension: Secondary | ICD-10-CM | POA: Diagnosis not present

## 2016-07-16 DIAGNOSIS — J9601 Acute respiratory failure with hypoxia: Secondary | ICD-10-CM | POA: Diagnosis not present

## 2016-07-16 DIAGNOSIS — I5043 Acute on chronic combined systolic (congestive) and diastolic (congestive) heart failure: Secondary | ICD-10-CM | POA: Diagnosis not present

## 2016-07-16 LAB — BASIC METABOLIC PANEL
ANION GAP: 10 (ref 5–15)
BUN: 51 mg/dL — AB (ref 6–20)
CHLORIDE: 97 mmol/L — AB (ref 101–111)
CO2: 29 mmol/L (ref 22–32)
Calcium: 9 mg/dL (ref 8.9–10.3)
Creatinine, Ser: 2.26 mg/dL — ABNORMAL HIGH (ref 0.44–1.00)
GFR calc Af Amer: 24 mL/min — ABNORMAL LOW (ref >60)
GFR calc non Af Amer: 21 mL/min — ABNORMAL LOW (ref >60)
GLUCOSE: 220 mg/dL — AB (ref 65–99)
POTASSIUM: 3.7 mmol/L (ref 3.5–5.1)
Sodium: 136 mmol/L (ref 135–145)

## 2016-07-16 LAB — GLUCOSE, CAPILLARY
Glucose-Capillary: 222 mg/dL — ABNORMAL HIGH (ref 65–99)
Glucose-Capillary: 261 mg/dL — ABNORMAL HIGH (ref 65–99)

## 2016-07-16 MED ORDER — TORSEMIDE 20 MG PO TABS: ORAL_TABLET | ORAL | 1 refills | Status: DC

## 2016-07-16 MED ORDER — GUAIFENESIN ER 600 MG PO TB12
1200.0000 mg | ORAL_TABLET | Freq: Two times a day (BID) | ORAL | Status: DC
  Administered 2016-07-16: 1200 mg via ORAL
  Filled 2016-07-16: qty 2

## 2016-07-16 MED ORDER — AMOXICILLIN-POT CLAVULANATE 500-125 MG PO TABS
1.0000 | ORAL_TABLET | Freq: Two times a day (BID) | ORAL | Status: DC
  Administered 2016-07-16: 500 mg via ORAL
  Filled 2016-07-16 (×5): qty 1

## 2016-07-16 MED ORDER — GUAIFENESIN ER 600 MG PO TB12: 1200.0000 mg | ORAL_TABLET | Freq: Two times a day (BID) | ORAL | 0 refills | Status: AC

## 2016-07-16 MED ORDER — ALBUTEROL SULFATE (2.5 MG/3ML) 0.083% IN NEBU: 2.5000 mg | INHALATION_SOLUTION | Freq: Four times a day (QID) | RESPIRATORY_TRACT | 1 refills | Status: AC | PRN

## 2016-07-16 MED ORDER — ALBUTEROL SULFATE (2.5 MG/3ML) 0.083% IN NEBU: 2.5000 mg | INHALATION_SOLUTION | Freq: Four times a day (QID) | RESPIRATORY_TRACT | 1 refills | Status: DC | PRN

## 2016-07-16 NOTE — Progress Notes (Signed)
PHARMACY NOTE:  ANTIMICROBIAL RENAL DOSAGE ADJUSTMENT  Current antimicrobial regimen includes a mismatch between antimicrobial dosage and estimated renal function.  As per policy approved by the Pharmacy & Therapeutics and Medical Executive Committees, the antimicrobial dosage will be adjusted accordingly.  Current antimicrobial dosage:  Augmentin 875//125 po bid  Indication: pneumonia  Renal Function:  Estimated Creatinine Clearance: 26.3 mL/min (A) (by C-G formula based on SCr of 2.26 mg/dL (H)). []      On intermittent HD, scheduled: []      On CRRT    Antimicrobial dosage has been changed to:  Augmentin 500/125 po bid  Additional comments:   Thank you for allowing pharmacy to be a part of this patient's care.  Gildardo Griffes [place], [facility] 07/16/2016 8:25 AM

## 2016-07-16 NOTE — Discharge Summary (Signed)
[name], is a 71 y.o. female  DOB 1945-06-28  MRN [id number].  Admission date:  07/14/2016  Admitting Physician  Albertine Shylo, MD  Discharge Date:  07/16/2016   Primary MD  [name], MD  Recommendations for primary care physician for things to follow:  - please check CBC, BMP during next visit, please repeat 2 view chest x-ray during next visit to ensure resolution of pneumonia.   Admission Diagnosis  Acute pulmonary edema (HCC) [J81.0]   Discharge Diagnosis  Acute pulmonary edema (HCC) [J81.0]   Active Problems:   Hyperlipemia   Essential hypertension   GERD   Constipation   Type 2 diabetes mellitus with stage 4 chronic kidney disease (HCC)   Acute on chronic combined systolic and diastolic CHF (congestive heart failure) (HCC)   Elevated troponin   Acute respiratory failure with hypoxia (HCC)   Pulmonary edema   CKD (chronic kidney disease), stage IV (HCC)      Past Medical History:  Diagnosis Date  . Acute CHF (congestive heart failure) ([place]) 07/04/2015  . Acute renal failure superimposed on stage 4 chronic kidney disease ([place]) 07/04/2015  . Allergic rhinitis   . Anxiety   . Constipation   . Diabetes mellitus   . GERD (gastroesophageal reflux disease)   . Heart murmur, systolic   . History of hysterectomy   . Hyperlipidemia   . Hypertension   . Low back pain   . Obesity   . Osteoarthritis   . Overactive bladder     Past Surgical History:  Procedure Laterality Date  . ABDOMINAL HYSTERECTOMY    . BACK SURGERY     multiple  . CARDIAC CATHETERIZATION  04/04/2006   Est EF of 60%  . CARDIAC CATHETERIZATION N/A 07/04/2015   Procedure: Left Heart Cath and Coronary Angiography;  Surgeon: Troy Sine, MD;  Location: [facility];  Service: Cardiovascular;  Laterality: N/A;  . CARDIAC CATHETERIZATION N/A 07/04/2015   Procedure: Coronary Stent Intervention;   Surgeon: Troy Sine, MD;  Location: [facility];  Service: Cardiovascular;  Laterality: N/A;  . CARDIAC CATHETERIZATION N/A 10/13/2015   Procedure: Left Heart Cath and Coronary Angiography;  Surgeon: Troy Sine, MD;  Location: [facility];  Service: Cardiovascular;  Laterality: N/A;  . CARDIAC CATHETERIZATION N/A 12/02/2015   Procedure: Left Heart Cath and Coronary Angiography;  Surgeon: Peter M Martinique, MD;  Location: [facility];  Service: Cardiovascular;  Laterality: N/A;  . CARPAL TUNNEL RELEASE    . CERVICAL BIOPSY     cervical lymph node biopsies  . COLONOSCOPY  May 2002   Dr. Irving Shows :Followup in 5 years, normal exam  . COLONOSCOPY  2008   Dr. Laural Golden: Very redundant colon with mild melanosis coli, splenic flexure polyp biopsy with acute complaint of benign colon polyp. Recommended ten-year followup  . CORONARY STENT PLACEMENT  04/11/2006   2 -- Taxus stents to the circumflex   . PERIPHERAL VASCULAR CATHETERIZATION Right 07/09/2015  Procedure: Upper Extremity Angiography;  Surgeon: Conrad Liverpool, MD;  Location: [facility];  Service: Cardiovascular;  Laterality: Right;  . PERIPHERAL VASCULAR CATHETERIZATION Right 07/10/2015   Procedure: RIGHT SUBCLAVIAN ARTERY THROMBECTOMY;  Surgeon: Serafina Mitchell, MD;  Location: [facility];  Service: Vascular;  Laterality: Right;  . tendonitis     bilateral elbow  . TRIGGER FINGER RELEASE         History of present illness and  Hospital Course:     Kindly see H&P for history of present illness and admission details, please review complete Labs, Consult reports and Test reports for all details in brief  HPI  from the history and physical done on the day of admission 07/14/2016  [name]  is a 71 y.o. female, With history of CHF, chronic kidney disease stage III, diabetes mellitus, hypertension Who was recently discharged on 07/12/2016 to continue to pneumonia, as is with complaint of shortness of breath, patient  reports yesterday evening, she started to develop some dyspnea, does report some cough, which has been for a few days, but a productive white sputum, she denies any fever or chills, dyspnea has been progressive overnight, was not able to lay supine, which prompted her to come to the ED this a.m., she was mildly hypoxic at 89%, but she was significantly dyspneic with increased work of breathing where she required BiPAP, she denies any chest pain, diuresis, reports some lower extremity edema, which has been at baseline, noticed to have significantly elevated blood pressure in ED with systolic 672, report she has been compliant with fluid restriction, salt restricted diet and her medication and daughters at bedside confirmed that, chest x-ray significant for vascular congestion, I was called to admit.   Hospital Course   71 y.o.female,With history of CHF, chronic kidney disease stage III, diabetes mellitus, hypertension Who was recently discharged on 5/8/2018Secondary to pneumonia, presents with dyspnea, workup significant for hypertensive urgency and CHF .  Acute on chronic combined CHF - Mostrecent echo 07/11/2016 with EF 45-50%, with severe hypokinesis/akinesis, with no significant change, as well as grade 2 diastolic dysfunction - Was started on IV diuresis, 80 Mg IV Lasix 3 Times Daily, Diuresed 3.2 L during Hospital Stay, No Further Dyspnea or Hypoxia, Lower Extremity Edema Resolved , patient tells me she is compliant with medication, fluid restriction, salt restriction at home, actually she tells me her home dose torsemide to 60 every morning, and 40 every afternoon, instructed her to increase to 60 twice a day . - continue with beta blockers, cannot tolerate ACEI/ARB given her renal failure, but will continue with hydralazine and Imdur.  Acute respiratory failure - Resolved, currently on room air  Hypertension - Initially antihypertensive on presentation, but currently blood pressure is  acceptable on home regimen, discharged with no changes  Elevated troponins - She denies any chest pain, this is most likely related to demand ischemia in the setting of hypertensive urgency and dyspnea, as well it is elevated at baseline in the setting of CKD, actually it is much better than her most recent value last week of 0.61  Pneumonia - she is afebrile, with no leukocytosis, continue with Augmentin  Diabetes mellitus - Continue home medication on discharge  Stage IV CK D - kidney to monitor closely, so far appears stable on diuresis  Constipation - At good bowel movement with laxative   Discharge Condition:  Stable   Follow UP  Follow-up Information    [name], MD Follow up  in 1 week(s).   Specialty:  Family Medicine Contact information: [address] [place] [address] [phone]             Discharge Instructions  and  Discharge Medications    Discharge Instructions    Discharge instructions    Complete by:  As directed    Follow with Primary MD [name], MD in 7 days   Get CBC, CMP, 2 view Chest X ray checked  by Primary MD next visit.    Activity: As tolerated with Full fall precautions use walker/cane & assistance as needed   Disposition Home    Diet: Heart Healthy, Carb modified, with 1.5 L fluid restriction.  , with feeding assistance and aspiration precautions.  For Heart failure patients - Check your Weight same time everyday, if you gain over 2 pounds, or you develop in leg swelling, experience more shortness of breath or chest pain, call your Primary MD immediately. Follow Cardiac Low Salt Diet and 1.5 lit/day fluid restriction.   On your next visit with your primary care physician please Get Medicines reviewed and adjusted.   Please request your Prim.MD to go over all Hospital Tests and Procedure/Radiological results at the follow up, please get all Hospital records sent to your Prim MD by signing hospital  release before you go home.   If you experience worsening of your admission symptoms, develop shortness of breath, life threatening emergency, suicidal or homicidal thoughts you must seek medical attention immediately by calling 911 or calling your MD immediately  if symptoms less severe.  You Must read complete instructions/literature along with all the possible adverse reactions/side effects for all the Medicines you take and that have been prescribed to you. Take any new Medicines after you have completely understood and accpet all the possible adverse reactions/side effects.   Do not drive, operating heavy machinery, perform activities at heights, swimming or participation in water activities or provide baby sitting services if your were admitted for syncope or siezures until you have seen by Primary MD or a Neurologist and advised to do so again.  Do not drive when taking Pain medications.    Do not take more than prescribed Pain, Sleep and Anxiety Medications  Special Instructions: If you have smoked or chewed Tobacco  in the last 2 yrs please stop smoking, stop any regular Alcohol  and or any Recreational drug use.  Wear Seat belts while driving.   Please note  You were cared for by a hospitalist during your hospital stay. If you have any questions about your discharge medications or the care you received while you were in the hospital after you are discharged, you can call the unit and asked to speak with the hospitalist on call if the hospitalist that took care of you is not available. Once you are discharged, your primary care physician will handle any further medical issues. Please note that NO REFILLS for any discharge medications will be authorized once you are discharged, as it is imperative that you return to your primary care physician (or establish a relationship with a primary care physician if you do not have one) for your aftercare needs so that they can reassess your need  for medications and monitor your lab values.   Increase activity slowly    Complete by:  As directed      Allergies as of 07/16/2016      Reactions   Ace Inhibitors Cough   Pt can tolerate Tribenzor (  and ARBs)   Codeine Rash      Medication List    STOP taking these medications   insulin glargine 100 UNIT/ML injection Commonly known as:  LANTUS     TAKE these medications   acetaminophen 500 MG tablet Commonly known as:  TYLENOL Take 2 tablets (1,000 mg total) by mouth every 6 (six) hours as needed (pain).   albuterol 108 (90 Base) MCG/ACT inhaler Commonly known as:  PROVENTIL HFA;VENTOLIN HFA Inhale 1-2 puffs into the lungs every 6 (six) hours as needed for wheezing or shortness of breath. What changed:  Another medication with the same name was added. Make sure you understand how and when to take each.   albuterol (2.5 MG/3ML) 0.083% nebulizer solution Commonly known as:  PROVENTIL Take 3 mLs (2.5 mg total) by nebulization every 6 (six) hours as needed for wheezing or shortness of breath. What changed:  You were already taking a medication with the same name, and this prescription was added. Make sure you understand how and when to take each.   ALPRAZolam 0.5 MG tablet Commonly known as:  XANAX Take 0.5 mg by mouth as needed for anxiety or sleep.   amoxicillin-clavulanate 875-125 MG tablet Commonly known as:  AUGMENTIN Take 1 tablet by mouth 2 (two) times daily.   aspirin EC 81 MG tablet Take 81 mg by mouth daily.   calcitRIOL 0.25 MCG capsule Commonly known as:  ROCALTROL Take 0.25 mcg by mouth every Monday, Wednesday, and Friday.   cetirizine 10 MG tablet Commonly known as:  ZYRTEC Take 10 mg by mouth daily as needed for allergies.   ferrous sulfate 325 (65 FE) MG tablet Take 325 mg by mouth daily.   guaiFENesin 600 MG 12 hr tablet Commonly known as:  MUCINEX Take 2 tablets (1,200 mg total) by mouth 2 (two) times daily.   hydrALAZINE 25 MG  tablet Commonly known as:  APRESOLINE Take 2 tablets (50 mg total) by mouth 3 (three) times daily.   insulin degludec 100 UNIT/ML Sopn FlexTouch Pen Commonly known as:  TRESIBA Inject 40 Units into the skin daily at 10 pm.   insulin lispro 100 UNIT/ML KiwkPen Commonly known as:  HUMALOG KWIKPEN Inject 0.15-0.21 mLs (15-21 Units total) into the skin 3 (three) times daily.   ipratropium 0.06 % nasal spray Commonly known as:  ATROVENT Place 2 sprays into the nose 2 (two) times daily.   isosorbide mononitrate 60 MG 24 hr tablet Commonly known as:  IMDUR Take 1 tablet (60 mg total) by mouth daily.   latanoprost 0.005 % ophthalmic solution Commonly known as:  XALATAN Place 1 drop into both eyes at bedtime.   meclizine 25 MG tablet Commonly known as:  ANTIVERT Take 1 tablet by mouth daily.   metoprolol succinate 50 MG 24 hr tablet Commonly known as:  TOPROL-XL TAKE ONE TABLET BY MOUTH TWICE DAILY WITH  OR  IMMEDIATELY  FOLLOWING  A  MEAL   multivitamin with minerals Tabs tablet Take 1 tablet by mouth daily.   nitroGLYCERIN 0.4 MG SL tablet Commonly known as:  NITROSTAT Place 1 tablet (0.4 mg total) under the tongue every 5 (five) minutes as needed for chest pain.   pantoprazole 40 MG tablet Commonly known as:  PROTONIX Take 1 tablet by mouth daily.   potassium chloride SA 20 MEQ tablet Commonly known as:  K-DUR,KLOR-CON Take 2 tablets (40 mEq total) by mouth daily.   RANEXA 500 MG 12 hr tablet Generic drug:  ranolazine TAKE  ONE TABLET BY MOUTH TWICE DAILY   rosuvastatin 40 MG tablet Commonly known as:  CRESTOR Take 1 tablet (40 mg total) by mouth at bedtime.   ticagrelor 90 MG Tabs tablet Commonly known as:  BRILINTA Take 1 tablet (90 mg total) by mouth 2 (two) times daily.   torsemide 20 MG tablet Commonly known as:  DEMADEX Take 60 mg oral twice daily, can take , can take an extra 20mg  daily if her weight increases by >3 pounds. What changed:  additional  instructions   trimethoprim 100 MG tablet Commonly known as:  TRIMPEX Take 1 tablet by mouth daily.            Durable Medical Equipment        Start     Ordered   07/15/16 1539  For home use only DME Nebulizer machine  Once    Question:  Patient needs a nebulizer to treat with the following condition  Answer:  Wheezing   07/15/16 1538        Diet and Activity recommendation: See Discharge Instructions above   Consults obtained -  none   Major procedures and Radiology Reports - PLEASE review detailed and final reports for all details, in brief -    Dg Chest 2 View  Result Date: 07/15/2016 CLINICAL DATA:  Shortness of Breath, chest pain EXAM: CHEST  2 VIEW COMPARISON:  07/14/2016 FINDINGS: There are small to moderate bilateral pleural effusions. Bilateral lower lobe atelectasis or infiltrates. Aeration has improved since prior study. Heart is borderline in size. IMPRESSION: Small to moderate bilateral effusions with bibasilar opacities. Overall aeration and airspace disease improved since prior study. Electronically Signed   By: Rolm Baptise M.D.   On: 07/15/2016 10:16   Dg Chest Portable 1 View  Result Date: 07/14/2016 CLINICAL DATA:  71 y/o  F; acute dyspnea. EXAM: PORTABLE CHEST 1 VIEW COMPARISON:  07/09/2016 chest radiograph FINDINGS: Increased hazy opacification of the lungs and lower lobe consolidations probably representing pulmonary edema. Cardiac silhouette is obscured by airspace disease. Underlying pneumonia is not excluded. No acute osseous abnormality identified. IMPRESSION: Increased hazy opacification of the lungs and lower lobe consolidations probably representing pulmonary edema. Cardiac silhouette is obscured by airspace disease. Underlying pneumonia is not excluded. Electronically Signed   By: Kristine Garbe M.D.   On: 07/14/2016 06:31   Dg Chest Port 1 View  Result Date: 07/09/2016 CLINICAL DATA:  Dyspnea tonight EXAM: PORTABLE CHEST 1 VIEW  COMPARISON:  04/01/2016 FINDINGS: Central and basilar airspace opacity bilaterally, right greater than left. In the right base there are air bronchograms. This may represent pneumonia. No large effusion. IMPRESSION: Dense consolidation in the right base with more patchy consolidation elsewhere in the central and basilar lungs bilaterally. This may represent pneumonia. Followup PA and lateral chest X-ray is recommended in 3-4 weeks following trial of antibiotic therapy to ensure resolution and exclude underlying malignancy. Electronically Signed   By: Andreas Newport M.D.   On: 07/09/2016 04:38    Micro Results    Recent Results (from the past 240 hour(s))  MRSA PCR Screening     Status: None   Collection Time: 07/09/16  8:45 AM  Result Value Ref Range Status   MRSA by PCR NEGATIVE NEGATIVE Final    Comment:        The GeneXpert MRSA Assay (FDA approved for NASAL specimens only), is one component of a comprehensive MRSA colonization surveillance program. It is not intended to diagnose MRSA infection nor to  guide or monitor treatment for MRSA infections.   Culture, blood (Routine X 2) w Reflex to ID Panel     Status: None   Collection Time: 07/09/16 10:22 AM  Result Value Ref Range Status   Specimen Description BLOOD BLOOD LEFT FOREARM  Final   Special Requests   Final    BOTTLES DRAWN AEROBIC AND ANAEROBIC Blood Culture results may not be optimal due to an inadequate volume of blood received in culture bottles   Culture NO GROWTH 5 DAYS  Final   Report Status 07/14/2016 FINAL  Final  Culture, blood (Routine X 2) w Reflex to ID Panel     Status: None   Collection Time: 07/09/16  2:47 PM  Result Value Ref Range Status   Specimen Description RIGHT ANTECUBITAL  Final   Special Requests   Final    BOTTLES DRAWN AEROBIC AND ANAEROBIC Blood Culture adequate volume   Culture NO GROWTH 5 DAYS  Final   Report Status 07/14/2016 FINAL  Final       Today   Subjective:   [name] today has no headache,no chest or abdominal pain,no new weakness tingling or numbness, feels much better wants to go home today. Still reports some cough, minimally productive, no dyspnea.  Objective:   Blood pressure 133/60, pulse 67, temperature 98.5 F (36.9 C), temperature source Oral, resp. rate 17, height 5\' 8"  (1.727 m), weight 86.6 kg (190 lb 14.7 oz), SpO2 100 %.   Intake/Output Summary (Last 24 hours) at 07/16/16 1034 Last data filed at 07/16/16 0020  Gross per 24 hour  Intake              480 ml  Output             2000 ml  Net            -1520 ml    Exam Awake Alert, Oriented x 3, No new F.N deficits, Normal affect Supple Neck,No JVD, Symmetrical Chest wall movement, Good air movement bilaterally, CTAB RRR,No Gallops,Rubs or new Murmurs, No Parasternal Heave +ve B.Sounds, Abd Soft, Non tender, No rebound -guarding or rigidity. No Cyanosis, Clubbing , No new Rash or bruise, edema has resolved.  Data Review   CBC w Diff: Lab Results  Component Value Date   WBC 7.8 07/14/2016   HGB 12.8 07/14/2016   HCT 39.6 07/14/2016   PLT 198 07/14/2016   LYMPHOPCT 26 07/14/2016   MONOPCT 5 07/14/2016   EOSPCT 1 07/14/2016   BASOPCT 1 07/14/2016    CMP: Lab Results  Component Value Date   NA 136 07/16/2016   NA 141 02/18/2016   K 3.7 07/16/2016   CL 97 (L) 07/16/2016   CO2 29 07/16/2016   BUN 51 (H) 07/16/2016   BUN 53 (H) 02/18/2016   CREATININE 2.26 (H) 07/16/2016   CREATININE 1.73 (H) 10/08/2013   PROT 8.6 (H) 07/14/2016   PROT 7.5 02/18/2016   ALBUMIN 4.2 07/14/2016   ALBUMIN 4.7 02/18/2016   BILITOT 0.7 07/14/2016   BILITOT 0.4 02/18/2016   ALKPHOS 102 07/14/2016   AST 35 07/14/2016   ALT 29 07/14/2016  .   Total Time in preparing paper work, data evaluation and todays exam - 35 minutes  [name] M.D on 07/16/2016 at 10:34 AM  [facility]   Office  [phone]

## 2016-07-16 NOTE — Progress Notes (Signed)
Patien does not wish to wear BiPAP, will leave in room for now.

## 2016-07-16 NOTE — Discharge Instructions (Signed)
Follow with Primary MD [name], MD in 7 days   Get CBC, CMP, 2 view Chest X ray checked  by Primary MD next visit.    Activity: As tolerated with Full fall precautions use walker/cane & assistance as needed   Disposition Home    Diet: Heart Healthy, Carb modified, with 1.5 L fluid restriction.  , with feeding assistance and aspiration precautions.  For Heart failure patients - Check your Weight same time everyday, if you gain over 2 pounds, or you develop in leg swelling, experience more shortness of breath or chest pain, call your Primary MD immediately. Follow Cardiac Low Salt Diet and 1.5 lit/day fluid restriction.   On your next visit with your primary care physician please Get Medicines reviewed and adjusted.   Please request your Prim.MD to go over all Hospital Tests and Procedure/Radiological results at the follow up, please get all Hospital records sent to your Prim MD by signing hospital release before you go home.   If you experience worsening of your admission symptoms, develop shortness of breath, life threatening emergency, suicidal or homicidal thoughts you must seek medical attention immediately by calling 911 or calling your MD immediately  if symptoms less severe.  You Must read complete instructions/literature along with all the possible adverse reactions/side effects for all the Medicines you take and that have been prescribed to you. Take any new Medicines after you have completely understood and accpet all the possible adverse reactions/side effects.   Do not drive, operating heavy machinery, perform activities at heights, swimming or participation in water activities or provide baby sitting services if your were admitted for syncope or siezures until you have seen by Primary MD or a Neurologist and advised to do so again.  Do not drive when taking Pain medications.    Do not take more than prescribed Pain, Sleep and Anxiety Medications  Special  Instructions: If you have smoked or chewed Tobacco  in the last 2 yrs please stop smoking, stop any regular Alcohol  and or any Recreational drug use.  Wear Seat belts while driving.   Please note  You were cared for by a hospitalist during your hospital stay. If you have any questions about your discharge medications or the care you received while you were in the hospital after you are discharged, you can call the unit and asked to speak with the hospitalist on call if the hospitalist that took care of you is not available. Once you are discharged, your primary care physician will handle any further medical issues. Please note that NO REFILLS for any discharge medications will be authorized once you are discharged, as it is imperative that you return to your primary care physician (or establish a relationship with a primary care physician if you do not have one) for your aftercare needs so that they can reassess your need for medications and monitor your lab values.

## 2016-07-19 NOTE — Telephone Encounter (Signed)
This encounter was created in error - please disregard.

## 2016-07-19 NOTE — Care Management (Signed)
CM received call from [place]. Patient received script for neb treatments but does not have neb machine. Order placed by Hospitalist, and neb machine will be delivered to patient's home by [facility].

## 2016-07-21 ENCOUNTER — Telehealth: Payer: Self-pay | Admitting: Cardiovascular Disease

## 2016-07-21 ENCOUNTER — Ambulatory Visit: Payer: Medicare Other | Admitting: "Endocrinology

## 2016-07-21 ENCOUNTER — Other Ambulatory Visit: Payer: Self-pay

## 2016-07-21 ENCOUNTER — Telehealth: Payer: Self-pay | Admitting: *Deleted

## 2016-07-21 NOTE — Patient Outreach (Signed)
[name]  07/21/2016  [name] July 01, 1945 [id number]  Care coordination TOC calls               [facility] CM noted [name] Discharge home from hospital on Saturday 07/16/16 when [facility] CM returned to work on 07/18/16 [facility] CM called to speak with [name] on 07/19/16 at 30 but her husband reports she was at her pcp office for a pcp follow up with family Left message including [facility] Cm mobile number for a return call  [name] called and left [facility] CM a voice message on 07/20/16 at 49 and [facility] CM returned a call to her at 57 [name] reports she is home and doing well and has seen pcp Dr [name].  Reports she is scheduled to get a machine from [facility] but they are not able to provide her with home health services.  She voiced she would like to have someone to teach her how to use the nebulizer.   On 07/21/16 0844 [name] called [facility] CM again to report her nebulizer had not arrived from [facility].  [facility] CM called [facility] to speak with Mohammed Kindle who reports [name] was informed on 07/19/16 that her nebulizer would be mailed to her after she paid for it on the phone with [facility]. [facility] CM asked if Mohammed Kindle would do a courtesy call to review this with [name] again.  [phone] [facility] CM called [name] back to confirm she did get a call from [facility] and had mistakenly thought it would be delivered by an [facility] staff but had the understanding that it would arrive in the mail this week.  SHe still voiced concern about someone showing hr how to use it.  [facility] CM explained CM had called a few home health agencies to attempt to find a home health nurse but [facility]  [phone] in [place] unable to take her insurance Message left for interim home health [phone].   [phone] [facility] Cm spoke with [name] at [place] [phone] who reports a referral received from Dr Berdine Addison  for [place] and her [place] staff visit [name] today and her [place] is primary and her HHPT case not opene dbecause [name] Kimbell informed them she would return to work on Monday Jul 24 2016 to the [place] would not  Be available to teach her how to use her nebulizer.     Plan [name] Rennaker did not inform [facility] CM she was planning to return to work next week  [name] Vantassel will be updated and [facility] CM will attempt to have her call to help her determine how to use her nebulizer when it arrives.  Chau Savell L. Lavina Hamman, RN, BSN, [name] [phone]

## 2016-07-21 NOTE — Telephone Encounter (Signed)
Contacted [facility] to see what I needed to send them for pt to be able to do rehab there. Faxed office note and rehab referral.

## 2016-07-21 NOTE — Telephone Encounter (Signed)
Patient states that she has found a cardiac rehab to join in [place] at the hospital. Please call [phone] for details on what to send for patient to be able to start. / tg

## 2016-08-03 ENCOUNTER — Telehealth: Payer: Self-pay

## 2016-08-03 NOTE — Telephone Encounter (Signed)
Pt notified to contact PCP. She agrees

## 2016-08-03 NOTE — Telephone Encounter (Signed)
Pt would like pain medication for the pain in her legs.  She said she did not sleep at all last night.  Please call her at [phone]

## 2016-08-07 ENCOUNTER — Emergency Department (HOSPITAL_COMMUNITY): Payer: BLUE CROSS/BLUE SHIELD

## 2016-08-07 ENCOUNTER — Observation Stay (HOSPITAL_COMMUNITY)
Admission: EM | Admit: 2016-08-07 | Discharge: 2016-08-08 | Disposition: A | Discharge status: Home / Self Care | Payer: BLUE CROSS/BLUE SHIELD | Attending: Internal Medicine | Admitting: Internal Medicine

## 2016-08-07 ENCOUNTER — Encounter (HOSPITAL_COMMUNITY): Payer: Self-pay | Admitting: *Deleted

## 2016-08-07 DIAGNOSIS — N3281 Overactive bladder: Secondary | ICD-10-CM | POA: Insufficient documentation

## 2016-08-07 DIAGNOSIS — I25110 Atherosclerotic heart disease of native coronary artery with unstable angina pectoris: Secondary | ICD-10-CM | POA: Insufficient documentation

## 2016-08-07 DIAGNOSIS — N184 Chronic kidney disease, stage 4 (severe): Secondary | ICD-10-CM | POA: Diagnosis not present

## 2016-08-07 DIAGNOSIS — I5043 Acute on chronic combined systolic (congestive) and diastolic (congestive) heart failure: Secondary | ICD-10-CM | POA: Diagnosis present

## 2016-08-07 DIAGNOSIS — R778 Other specified abnormalities of plasma proteins: Secondary | ICD-10-CM | POA: Diagnosis present

## 2016-08-07 DIAGNOSIS — R0602 Shortness of breath: Secondary | ICD-10-CM | POA: Diagnosis present

## 2016-08-07 DIAGNOSIS — I130 Hypertensive heart and chronic kidney disease with heart failure and stage 1 through stage 4 chronic kidney disease, or unspecified chronic kidney disease: Secondary | ICD-10-CM | POA: Diagnosis not present

## 2016-08-07 DIAGNOSIS — E785 Hyperlipidemia, unspecified: Secondary | ICD-10-CM | POA: Diagnosis not present

## 2016-08-07 DIAGNOSIS — R739 Hyperglycemia, unspecified: Secondary | ICD-10-CM | POA: Diagnosis present

## 2016-08-07 DIAGNOSIS — M1990 Unspecified osteoarthritis, unspecified site: Secondary | ICD-10-CM | POA: Insufficient documentation

## 2016-08-07 DIAGNOSIS — Z7982 Long term (current) use of aspirin: Secondary | ICD-10-CM | POA: Diagnosis not present

## 2016-08-07 DIAGNOSIS — I214 Non-ST elevation (NSTEMI) myocardial infarction: Secondary | ICD-10-CM | POA: Insufficient documentation

## 2016-08-07 DIAGNOSIS — R7989 Other specified abnormal findings of blood chemistry: Secondary | ICD-10-CM

## 2016-08-07 DIAGNOSIS — E1122 Type 2 diabetes mellitus with diabetic chronic kidney disease: Secondary | ICD-10-CM | POA: Diagnosis not present

## 2016-08-07 DIAGNOSIS — I10 Essential (primary) hypertension: Secondary | ICD-10-CM | POA: Diagnosis present

## 2016-08-07 DIAGNOSIS — F419 Anxiety disorder, unspecified: Secondary | ICD-10-CM | POA: Insufficient documentation

## 2016-08-07 DIAGNOSIS — N186 End stage renal disease: Secondary | ICD-10-CM | POA: Diagnosis present

## 2016-08-07 DIAGNOSIS — Z955 Presence of coronary angioplasty implant and graft: Secondary | ICD-10-CM | POA: Insufficient documentation

## 2016-08-07 DIAGNOSIS — E1110 Type 2 diabetes mellitus with ketoacidosis without coma: Secondary | ICD-10-CM | POA: Diagnosis not present

## 2016-08-07 DIAGNOSIS — Z794 Long term (current) use of insulin: Secondary | ICD-10-CM | POA: Diagnosis not present

## 2016-08-07 DIAGNOSIS — Z7902 Long term (current) use of antithrombotics/antiplatelets: Secondary | ICD-10-CM | POA: Diagnosis not present

## 2016-08-07 DIAGNOSIS — R748 Abnormal levels of other serum enzymes: Secondary | ICD-10-CM | POA: Diagnosis not present

## 2016-08-07 DIAGNOSIS — Z79899 Other long term (current) drug therapy: Secondary | ICD-10-CM | POA: Diagnosis not present

## 2016-08-07 DIAGNOSIS — K219 Gastro-esophageal reflux disease without esophagitis: Secondary | ICD-10-CM | POA: Diagnosis not present

## 2016-08-07 HISTORY — DX: Peripheral vascular disease, unspecified: I73.9

## 2016-08-07 HISTORY — DX: Atherosclerotic heart disease of native coronary artery without angina pectoris: I25.10

## 2016-08-07 HISTORY — DX: Chronic combined systolic (congestive) and diastolic (congestive) heart failure: I50.42

## 2016-08-07 HISTORY — DX: Chronic kidney disease, stage 4 (severe): N18.4

## 2016-08-07 HISTORY — DX: Ventricular premature depolarization: I49.3

## 2016-08-07 HISTORY — DX: Atherosclerosis of other arteries: I70.8

## 2016-08-07 HISTORY — DX: Anemia, unspecified: D64.9

## 2016-08-07 HISTORY — DX: Disorder of arteries and arterioles, unspecified: I77.9

## 2016-08-07 LAB — CREATININE, SERUM
CREATININE: 2 mg/dL — AB (ref 0.44–1.00)
GFR calc Af Amer: 28 mL/min — ABNORMAL LOW (ref >60)
GFR calc non Af Amer: 24 mL/min — ABNORMAL LOW (ref >60)

## 2016-08-07 LAB — CBC WITH DIFFERENTIAL/PLATELET
BASOS ABS: 0 10*3/uL (ref 0.0–0.1)
Basophils Relative: 0 %
Eosinophils Absolute: 0 10*3/uL (ref 0.0–0.7)
Eosinophils Relative: 1 %
HEMATOCRIT: 33.8 % — AB (ref 36.0–46.0)
HEMOGLOBIN: 10.5 g/dL — AB (ref 12.0–15.0)
LYMPHS PCT: 12 %
Lymphs Abs: 0.6 10*3/uL — ABNORMAL LOW (ref 0.7–4.0)
MCH: 31.4 pg (ref 26.0–34.0)
MCHC: 31.1 g/dL (ref 30.0–36.0)
MCV: 101.2 fL — ABNORMAL HIGH (ref 78.0–100.0)
MONO ABS: 0.3 10*3/uL (ref 0.1–1.0)
Monocytes Relative: 6 %
NEUTROS ABS: 3.7 10*3/uL (ref 1.7–7.7)
NEUTROS PCT: 81 %
Platelets: 180 10*3/uL (ref 150–400)
RBC: 3.34 MIL/uL — ABNORMAL LOW (ref 3.87–5.11)
RDW: 15.6 % — AB (ref 11.5–15.5)
WBC: 4.5 10*3/uL (ref 4.0–10.5)

## 2016-08-07 LAB — BASIC METABOLIC PANEL
ANION GAP: 15 (ref 5–15)
ANION GAP: 14 (ref 5–15)
ANION GAP: 14 (ref 5–15)
BUN: 71 mg/dL — ABNORMAL HIGH (ref 6–20)
BUN: 68 mg/dL — AB (ref 6–20)
BUN: 63 mg/dL — ABNORMAL HIGH (ref 6–20)
CALCIUM: 10.5 mg/dL — AB (ref 8.9–10.3)
CALCIUM: 10.1 mg/dL (ref 8.9–10.3)
CALCIUM: 10.5 mg/dL — AB (ref 8.9–10.3)
CHLORIDE: 90 mmol/L — AB (ref 101–111)
CO2: 31 mmol/L (ref 22–32)
CO2: 29 mmol/L (ref 22–32)
CO2: 33 mmol/L — AB (ref 22–32)
Chloride: 91 mmol/L — ABNORMAL LOW (ref 101–111)
Chloride: 96 mmol/L — ABNORMAL LOW (ref 101–111)
Creatinine, Ser: 2.38 mg/dL — ABNORMAL HIGH (ref 0.44–1.00)
Creatinine, Ser: 2.34 mg/dL — ABNORMAL HIGH (ref 0.44–1.00)
Creatinine, Ser: 2.12 mg/dL — ABNORMAL HIGH (ref 0.44–1.00)
GFR calc Af Amer: 22 mL/min — ABNORMAL LOW (ref >60)
GFR calc Af Amer: 23 mL/min — ABNORMAL LOW (ref >60)
GFR calc Af Amer: 26 mL/min — ABNORMAL LOW (ref >60)
GFR calc non Af Amer: 19 mL/min — ABNORMAL LOW (ref >60)
GFR calc non Af Amer: 22 mL/min — ABNORMAL LOW (ref >60)
GFR, EST NON AFRICAN AMERICAN: 20 mL/min — AB (ref >60)
GLUCOSE: 661 mg/dL — AB (ref 65–99)
GLUCOSE: 127 mg/dL — AB (ref 65–99)
Glucose, Bld: 482 mg/dL — ABNORMAL HIGH (ref 65–99)
POTASSIUM: 4.3 mmol/L (ref 3.5–5.1)
POTASSIUM: 3.4 mmol/L — AB (ref 3.5–5.1)
Potassium: 3.4 mmol/L — ABNORMAL LOW (ref 3.5–5.1)
SODIUM: 134 mmol/L — AB (ref 135–145)
Sodium: 136 mmol/L (ref 135–145)
Sodium: 143 mmol/L (ref 135–145)

## 2016-08-07 LAB — CBG MONITORING, ED
GLUCOSE-CAPILLARY: 437 mg/dL — AB (ref 65–99)
Glucose-Capillary: 537 mg/dL (ref 65–99)

## 2016-08-07 LAB — CBC
HEMATOCRIT: 32.6 % — AB (ref 36.0–46.0)
HEMOGLOBIN: 10.5 g/dL — AB (ref 12.0–15.0)
MCH: 32 pg (ref 26.0–34.0)
MCHC: 32.2 g/dL (ref 30.0–36.0)
MCV: 99.4 fL (ref 78.0–100.0)
Platelets: 184 10*3/uL (ref 150–400)
RBC: 3.28 MIL/uL — ABNORMAL LOW (ref 3.87–5.11)
RDW: 15.1 % (ref 11.5–15.5)
WBC: 4.5 10*3/uL (ref 4.0–10.5)

## 2016-08-07 LAB — URINALYSIS, ROUTINE W REFLEX MICROSCOPIC
Bacteria, UA: NONE SEEN
Bilirubin Urine: NEGATIVE
Glucose, UA: >=500 mg/dL — AB
Ketones, ur: 5 mg/dL — AB
Leukocytes, UA: NEGATIVE
Nitrite: NEGATIVE
PROTEIN: NEGATIVE mg/dL
Specific Gravity, Urine: 1.009 (ref 1.005–1.030)
pH: 6 (ref 5.0–8.0)

## 2016-08-07 LAB — GLUCOSE, CAPILLARY
GLUCOSE-CAPILLARY: 320 mg/dL — AB (ref 65–99)
Glucose-Capillary: 388 mg/dL — ABNORMAL HIGH (ref 65–99)
Glucose-Capillary: 191 mg/dL — ABNORMAL HIGH (ref 65–99)
Glucose-Capillary: 172 mg/dL — ABNORMAL HIGH (ref 65–99)

## 2016-08-07 LAB — TROPONIN I
Troponin I: 0.05 ng/mL (ref <0.03)
Troponin I: 0.49 ng/mL (ref <0.03)
Troponin I: 1.75 ng/mL (ref <0.03)
Troponin I: 1.13 ng/mL (ref <0.03)

## 2016-08-07 LAB — BRAIN NATRIURETIC PEPTIDE: B NATRIURETIC PEPTIDE 5: 356 pg/mL — AB (ref 0.0–100.0)

## 2016-08-07 MED ORDER — ONDANSETRON HCL 4 MG/2ML IJ SOLN: 4.0000 mg | Freq: Four times a day (QID) | INTRAMUSCULAR | Status: DC | PRN

## 2016-08-07 MED ORDER — ROSUVASTATIN CALCIUM 20 MG PO TABS
40.0000 mg | ORAL_TABLET | Freq: Every day | ORAL | Status: DC
  Administered 2016-08-07: 40 mg via ORAL
  Filled 2016-08-07: qty 2

## 2016-08-07 MED ORDER — RANOLAZINE ER 500 MG PO TB12
500.0000 mg | ORAL_TABLET | Freq: Two times a day (BID) | ORAL | Status: DC
  Administered 2016-08-07 – 2016-08-08 (×2): 500 mg via ORAL
  Filled 2016-08-07 (×7): qty 1

## 2016-08-07 MED ORDER — INSULIN GLARGINE 100 UNIT/ML ~~LOC~~ SOLN
40.0000 [IU] | SUBCUTANEOUS | Status: DC
  Administered 2016-08-07 – 2016-08-08 (×2): 40 [IU] via SUBCUTANEOUS
  Filled 2016-08-07 (×3): qty 0.4

## 2016-08-07 MED ORDER — INSULIN ASPART 100 UNIT/ML ~~LOC~~ SOLN
0.0000 [IU] | Freq: Every day | SUBCUTANEOUS | Status: DC
  Administered 2016-08-07: 4 [IU] via SUBCUTANEOUS

## 2016-08-07 MED ORDER — HYDRALAZINE HCL 25 MG PO TABS
50.0000 mg | ORAL_TABLET | Freq: Three times a day (TID) | ORAL | Status: DC
  Administered 2016-08-07 – 2016-08-08 (×4): 50 mg via ORAL
  Filled 2016-08-07 (×4): qty 2

## 2016-08-07 MED ORDER — ISOSORBIDE MONONITRATE ER 60 MG PO TB24
60.0000 mg | ORAL_TABLET | Freq: Every day | ORAL | Status: DC
  Administered 2016-08-07 – 2016-08-08 (×2): 60 mg via ORAL
  Filled 2016-08-07 (×2): qty 1

## 2016-08-07 MED ORDER — INSULIN ASPART 100 UNIT/ML ~~LOC~~ SOLN
15.0000 [IU] | Freq: Once | SUBCUTANEOUS | Status: AC
  Administered 2016-08-07: 15 [IU] via SUBCUTANEOUS
  Filled 2016-08-07: qty 1

## 2016-08-07 MED ORDER — LORATADINE 10 MG PO TABS
10.0000 mg | ORAL_TABLET | Freq: Every day | ORAL | Status: DC
  Administered 2016-08-08: 10 mg via ORAL
  Filled 2016-08-07 (×2): qty 1

## 2016-08-07 MED ORDER — DEXTROSE 50 % IV SOLN: 25.0000 mL | INTRAVENOUS | Status: DC | PRN

## 2016-08-07 MED ORDER — SODIUM CHLORIDE 0.9 % IV SOLN
INTRAVENOUS | Status: DC
  Administered 2016-08-07: 3.8 [IU]/h via INTRAVENOUS
  Filled 2016-08-07: qty 1

## 2016-08-07 MED ORDER — ENOXAPARIN SODIUM 30 MG/0.3ML ~~LOC~~ SOLN
30.0000 mg | SUBCUTANEOUS | Status: DC
  Administered 2016-08-07: 30 mg via SUBCUTANEOUS
  Filled 2016-08-07: qty 0.3

## 2016-08-07 MED ORDER — FUROSEMIDE 10 MG/ML IJ SOLN
40.0000 mg | Freq: Two times a day (BID) | INTRAMUSCULAR | Status: DC
  Administered 2016-08-07 – 2016-08-08 (×2): 40 mg via INTRAVENOUS
  Filled 2016-08-07 (×2): qty 4

## 2016-08-07 MED ORDER — PANTOPRAZOLE SODIUM 40 MG PO TBEC
40.0000 mg | DELAYED_RELEASE_TABLET | Freq: Every day | ORAL | Status: DC
  Administered 2016-08-07 – 2016-08-08 (×2): 40 mg via ORAL
  Filled 2016-08-07 (×2): qty 1

## 2016-08-07 MED ORDER — ONDANSETRON HCL 4 MG PO TABS: 4.0000 mg | ORAL_TABLET | Freq: Four times a day (QID) | ORAL | Status: DC | PRN

## 2016-08-07 MED ORDER — SODIUM CHLORIDE 0.9 % IV SOLN
INTRAVENOUS | Status: AC
  Filled 2016-08-07: qty 1

## 2016-08-07 MED ORDER — FERROUS SULFATE 325 (65 FE) MG PO TABS
325.0000 mg | ORAL_TABLET | Freq: Every day | ORAL | Status: DC
  Administered 2016-08-08: 325 mg via ORAL
  Filled 2016-08-07: qty 1

## 2016-08-07 MED ORDER — DEXTROSE-NACL 5-0.45 % IV SOLN
INTRAVENOUS | Status: DC
  Administered 2016-08-07: 11:00:00 via INTRAVENOUS

## 2016-08-07 MED ORDER — ALPRAZOLAM 0.5 MG PO TABS
0.5000 mg | ORAL_TABLET | Freq: Every day | ORAL | Status: DC | PRN
Start: 2016-08-07 — End: 2016-08-08

## 2016-08-07 MED ORDER — ACETAMINOPHEN 650 MG RE SUPP
650.0000 mg | Freq: Four times a day (QID) | RECTAL | Status: DC | PRN
Start: 2016-08-07 — End: 2016-08-08

## 2016-08-07 MED ORDER — INSULIN REGULAR BOLUS VIA INFUSION
0.0000 [IU] | Freq: Three times a day (TID) | INTRAVENOUS | Status: DC
  Filled 2016-08-07: qty 10

## 2016-08-07 MED ORDER — INSULIN ASPART 100 UNIT/ML ~~LOC~~ SOLN
0.0000 [IU] | Freq: Three times a day (TID) | SUBCUTANEOUS | Status: DC
  Administered 2016-08-07: 4 [IU] via SUBCUTANEOUS
  Administered 2016-08-08: 15 [IU] via SUBCUTANEOUS
  Administered 2016-08-08: 11 [IU] via SUBCUTANEOUS

## 2016-08-07 MED ORDER — ACETAMINOPHEN 325 MG PO TABS: 650.0000 mg | ORAL_TABLET | Freq: Four times a day (QID) | ORAL | Status: DC | PRN

## 2016-08-07 MED ORDER — TRIMETHOPRIM 100 MG PO TABS
100.0000 mg | ORAL_TABLET | Freq: Every day | ORAL | Status: DC
  Administered 2016-08-07 – 2016-08-08 (×2): 100 mg via ORAL
  Filled 2016-08-07 (×4): qty 1

## 2016-08-07 MED ORDER — POTASSIUM CHLORIDE CRYS ER 20 MEQ PO TBCR
40.0000 meq | EXTENDED_RELEASE_TABLET | ORAL | Status: AC
  Administered 2016-08-07 (×2): 40 meq via ORAL
  Filled 2016-08-07 (×2): qty 2

## 2016-08-07 MED ORDER — SODIUM CHLORIDE 0.9 % IV SOLN
INTRAVENOUS | Status: DC
  Administered 2016-08-07: 07:00:00 via INTRAVENOUS

## 2016-08-07 MED ORDER — LATANOPROST 0.005 % OP SOLN
OPHTHALMIC | Status: AC
  Filled 2016-08-07: qty 2.5

## 2016-08-07 MED ORDER — MECLIZINE HCL 12.5 MG PO TABS
25.0000 mg | ORAL_TABLET | Freq: Every day | ORAL | Status: DC
  Administered 2016-08-07 – 2016-08-08 (×2): 25 mg via ORAL
  Filled 2016-08-07 (×2): qty 2

## 2016-08-07 MED ORDER — CALCITRIOL 0.25 MCG PO CAPS
0.2500 ug | ORAL_CAPSULE | ORAL | Status: DC
  Administered 2016-08-08: 0.25 ug via ORAL
  Filled 2016-08-07: qty 1

## 2016-08-07 MED ORDER — LATANOPROST 0.005 % OP SOLN
1.0000 [drp] | Freq: Every day | OPHTHALMIC | Status: DC
  Administered 2016-08-07: 1 [drp] via OPHTHALMIC
  Filled 2016-08-07: qty 2.5

## 2016-08-07 MED ORDER — ASPIRIN 325 MG PO TABS
325.0000 mg | ORAL_TABLET | Freq: Every day | ORAL | Status: DC
  Administered 2016-08-07 – 2016-08-08 (×2): 325 mg via ORAL
  Filled 2016-08-07 (×2): qty 1

## 2016-08-07 MED ORDER — FUROSEMIDE 10 MG/ML IJ SOLN: 40.0000 mg | Freq: Two times a day (BID) | INTRAMUSCULAR | Status: DC

## 2016-08-07 MED ORDER — TICAGRELOR 90 MG PO TABS
90.0000 mg | ORAL_TABLET | Freq: Two times a day (BID) | ORAL | Status: DC
  Administered 2016-08-07 – 2016-08-08 (×2): 90 mg via ORAL
  Filled 2016-08-07 (×7): qty 1

## 2016-08-07 MED ORDER — IPRATROPIUM-ALBUTEROL 0.5-2.5 (3) MG/3ML IN SOLN
3.0000 mL | Freq: Once | RESPIRATORY_TRACT | Status: AC
  Administered 2016-08-07: 3 mL via RESPIRATORY_TRACT
  Filled 2016-08-07: qty 3

## 2016-08-07 MED ORDER — METOPROLOL SUCCINATE ER 50 MG PO TB24
50.0000 mg | ORAL_TABLET | Freq: Two times a day (BID) | ORAL | Status: DC
  Administered 2016-08-07 – 2016-08-08 (×2): 50 mg via ORAL
  Filled 2016-08-07 (×2): qty 1

## 2016-08-07 MED ORDER — ASPIRIN EC 81 MG PO TBEC: 81.0000 mg | DELAYED_RELEASE_TABLET | Freq: Every day | ORAL | Status: DC

## 2016-08-07 MED ORDER — POTASSIUM CHLORIDE CRYS ER 20 MEQ PO TBCR
40.0000 meq | EXTENDED_RELEASE_TABLET | Freq: Every day | ORAL | Status: DC
  Administered 2016-08-08: 40 meq via ORAL
  Filled 2016-08-07: qty 2

## 2016-08-07 NOTE — ED Triage Notes (Signed)
Pt c/o some sob x 2 hours with productive cough of white sputum; pt denies any chest pain

## 2016-08-07 NOTE — H&P (Signed)
TRH H&P    Patient Demographics:    [name], is a 71 y.o. female  MRN: [id number]  DOB - 30-Jan-1946  Admit Date - 08/07/2016  Referring MD/NP/PA: Dr Dina Rich  Outpatient Primary MD for the patient is [name], MD  Patient coming from: home  Chief Complaint  Patient presents with  . Shortness of Breath      HPI:    Bev Drennen  is a 71 y.o. female, With history of chronic diastolic CHF, CKD stage IV, diabetes mellitus, hypertension who came to hospital with complaints of shortness of breath since this morning. Patient says that bowel went out and she could not use her nebulizer at home. Also patient has been using albuterol inhaler very frequently. She continues to have productive cough with clear phlegm. In the ED chest x-ray showed pulmonary edema, patient is not requiring oxygen. Her oxygen saturation is 98% on room air. Labs also revealed hyperglycemia, glucose 661 mild DKA with anion gap of  15  . Her CO2 is 31 She denies nausea vomiting or diarrhea. No dysuria. No fever or chills. She denies chest pain. Troponin found to be mildly elevated at 0.05   Review of systems:      A full 10 point Review of Systems was done, except as stated above, all other Review of Systems were negative.   With Past History of the following :    Past Medical History:  Diagnosis Date  . Acute CHF (congestive heart failure) ([place]) 07/04/2015  . Acute renal failure superimposed on stage 4 chronic kidney disease ([place]) 07/04/2015  . Allergic rhinitis   . Anxiety   . Constipation   . Diabetes mellitus   . GERD (gastroesophageal reflux disease)   . Heart murmur, systolic   . History of hysterectomy   . Hyperlipidemia   . Hypertension   . Low back pain   . Obesity   . Osteoarthritis   . Overactive bladder       Past Surgical History:  Procedure Laterality Date  . ABDOMINAL HYSTERECTOMY    . BACK  SURGERY     multiple  . CARDIAC CATHETERIZATION  04/04/2006   Est EF of 60%  . CARDIAC CATHETERIZATION N/A 07/04/2015   Procedure: Left Heart Cath and Coronary Angiography;  Surgeon: Troy Sine, MD;  Location: [place];  Service: Cardiovascular;  Laterality: N/A;  . CARDIAC CATHETERIZATION N/A 07/04/2015   Procedure: Coronary Stent Intervention;  Surgeon: Troy Sine, MD;  Location: [place] [facility];  Service: Cardiovascular;  Laterality: N/A;  . CARDIAC CATHETERIZATION N/A 10/13/2015   Procedure: Left Heart Cath and Coronary Angiography;  Surgeon: Troy Sine, MD;  Location: [facility];  Service: Cardiovascular;  Laterality: N/A;  . CARDIAC CATHETERIZATION N/A 12/02/2015   Procedure: Left Heart Cath and Coronary Angiography;  Surgeon: Peter M Martinique, MD;  Location: [facility];  Service: Cardiovascular;  Laterality: N/A;  . CARPAL TUNNEL RELEASE    . CERVICAL BIOPSY     cervical lymph node  biopsies  . COLONOSCOPY  May 2002   Dr. Irving Shows :Followup in 5 years, normal exam  . COLONOSCOPY  2008   Dr. Laural Golden: Very redundant colon with mild melanosis coli, splenic flexure polyp biopsy with acute complaint of benign colon polyp. Recommended ten-year followup  . CORONARY STENT PLACEMENT  04/11/2006   2 -- Taxus stents to the circumflex   . PERIPHERAL VASCULAR CATHETERIZATION Right 07/09/2015   Procedure: Upper Extremity Angiography;  Surgeon: Conrad [place], MD;  Location: [facility];  Service: Cardiovascular;  Laterality: Right;  . PERIPHERAL VASCULAR CATHETERIZATION Right 07/10/2015   Procedure: RIGHT SUBCLAVIAN ARTERY THROMBECTOMY;  Surgeon: Serafina Mitchell, MD;  Location[facility];  Service: Vascular;  Laterality: Right;  . tendonitis     bilateral elbow  . TRIGGER FINGER RELEASE        Social History:      Social History  Substance Use Topics  . Smoking status: Never Smoker  . Smokeless tobacco: Never Used  . Alcohol use No       Family  History :     Family History  Problem Relation Age of Onset  . Diabetes Father   . Hypertension Father   . Stroke Father   . Hypertension Brother   . Aneurysm Brother   . Diabetes Brother   . Colon cancer Neg Hx       Home Medications:   Prior to Admission medications   Medication Sig Start Date End Date Taking? Authorizing Provider  GuaiFENesin (MUCINEX PO) Take 1 tablet by mouth 2 times daily at 12 noon and 4 pm.   Yes [provider]  acetaminophen (TYLENOL) 500 MG tablet Take 2 tablets (1,000 mg total) by mouth every 6 (six) hours as needed (pain). 07/12/15   Lily Kocher, MD  albuterol (PROVENTIL HFA;VENTOLIN HFA) [phone] Base) MCG/ACT inhaler Inhale 1-2 puffs into the lungs every 6 (six) hours as needed for wheezing or shortness of breath. 04/01/16   Varney Biles, MD  albuterol (PROVENTIL) (2.5 MG/3ML) 0.083% nebulizer solution Take 3 mLs (2.5 mg total) by nebulization every 6 (six) hours as needed for wheezing or shortness of breath. 07/16/16   Elgergawy, Silver Huguenin, MD  ALPRAZolam Duanne Moron) 0.5 MG tablet Take 0.5 mg by mouth as needed for anxiety or sleep.     [provider]  aspirin EC 81 MG tablet Take 81 mg by mouth daily.    [provider]  calcitRIOL (ROCALTROL) 0.25 MCG capsule Take 0.25 mcg by mouth every Monday, Wednesday, and Friday.  03/23/15   [provider]  cetirizine (ZYRTEC) 10 MG tablet Take 10 mg by mouth daily as needed for allergies.    [provider]  ferrous sulfate 325 (65 FE) MG tablet Take 325 mg by mouth daily.    [provider]  hydrALAZINE (APRESOLINE) 25 MG tablet Take 2 tablets (50 mg total) by mouth 3 (three) times daily. 01/21/16   Herminio Commons, MD  insulin degludec (TRESIBA) 100 UNIT/ML SOPN FlexTouch Pen Inject 40 Units into the skin daily at 10 pm.    [provider]  insulin lispro (HUMALOG KWIKPEN) 100 UNIT/ML KiwkPen Inject 0.15-0.21 mLs (15-21 Units total) into the skin 3  (three) times daily. 03/22/16   Cassandria Anger, MD  ipratropium (ATROVENT) 0.06 % nasal spray Place 2 sprays into the nose 2 (two) times daily. 05/24/16   [provider]  isosorbide mononitrate (IMDUR) 60 MG 24 hr tablet Take 1  tablet (60 mg total) by mouth daily. 07/12/15   Lily Kocher, MD  latanoprost (XALATAN) 0.005 % ophthalmic solution Place 1 drop into both eyes at bedtime.  10/25/11   [provider]  meclizine (ANTIVERT) 25 MG tablet Take 1 tablet by mouth daily. 03/06/16   [provider]  metoprolol succinate (TOPROL-XL) 50 MG 24 hr tablet TAKE ONE TABLET BY MOUTH TWICE DAILY WITH  OR  IMMEDIATELY  FOLLOWING  A  MEAL 04/28/16   Lyda Jester M, PA-C  Multiple Vitamin (MULTIVITAMIN WITH MINERALS) TABS tablet Take 1 tablet by mouth daily.    [provider]  nitroGLYCERIN (NITROSTAT) 0.4 MG SL tablet Place 1 tablet (0.4 mg total) under the tongue every 5 (five) minutes as needed for chest pain. 10/14/15   Lyda Jester M, PA-C  pantoprazole (PROTONIX) 40 MG tablet Take 1 tablet by mouth daily. 06/28/16   [provider]  potassium chloride SA (K-DUR,KLOR-CON) 20 MEQ tablet Take 2 tablets (40 mEq total) by mouth daily. 10/14/15   Rosita Fire, Brittainy M, PA-C  RANEXA 500 MG 12 hr tablet TAKE ONE TABLET BY MOUTH TWICE DAILY 05/04/16   Troy Sine, MD  rosuvastatin (CRESTOR) 40 MG tablet Take 1 tablet (40 mg total) by mouth at bedtime. 07/12/15   Lily Kocher, MD  ticagrelor (BRILINTA) 90 MG TABS tablet Take 1 tablet (90 mg total) by mouth 2 (two) times daily. 10/14/15   Lyda Jester M, PA-C  torsemide (DEMADEX) 20 MG tablet Take 60 mg oral twice daily, can take , can take an extra 20mg  daily if her weight increases by >3 pounds. 07/16/16   Elgergawy, Silver Huguenin, MD  trimethoprim (TRIMPEX) 100 MG tablet Take 1 tablet by mouth daily. 06/06/16   [provider]     Allergies:     Allergies  Allergen Reactions  . Ace Inhibitors  Cough    Pt can tolerate Tribenzor (and ARBs)  . Codeine Rash     Physical Exam:   Vitals  Blood pressure (!) 146/67, pulse 82, temperature 98.2 F (36.8 C), temperature source Oral, resp. rate (!) 22, height 5\' 11"  (1.803 m), weight 88.5 kg (195 lb), SpO2 92 %.  1.  General: Appears in no acute distress  2. Psychiatric:  Intact judgement and  insight, awake alert, oriented x 3.  3. Neurologic: No focal neurological deficits, all cranial nerves intact.Strength 5/5 all 4 extremities, sensation intact all 4 extremities, plantars down going.  4. Eyes :  anicteric sclerae, moist conjunctivae with no lid lag. PERRLA.  5. ENMT:  Oropharynx clear with moist mucous membranes and good dentition  6. Neck:  supple, no cervical lymphadenopathy appriciated, No thyromegaly  7. Respiratory : Normal respiratory effort, bibasilar crackles  8. Cardiovascular : RRR, no gallops, rubs or murmurs, no leg edema  9. Gastrointestinal:  Positive bowel sounds, abdomen soft, non-tender to palpation,no hepatosplenomegaly, no rigidity or guarding       10. Skin:  No cyanosis, normal texture and turgor, no rash, lesions or ulcers  11.Musculoskeletal:  Good muscle tone,  joints appear normal , no effusions,  normal range of motion    Data Review:    CBC  Recent Labs Lab 08/07/16 0320  WBC 4.5  HGB 10.5*  HCT 33.8*  PLT 180  MCV 101.2*  MCH 31.4  MCHC 31.1  RDW 15.6*  LYMPHSABS 0.6*  MONOABS 0.3  EOSABS 0.0  BASOSABS 0.0   ------------------------------------------------------------------------------------------------------------------  Chemistries   Recent Labs Lab 08/07/16 0320  NA 136  K 4.3  CL 90*  CO2 31  GLUCOSE 661*  BUN 71*  CREATININE 2.38*  CALCIUM 10.5*    ------------------------------------------------------------------------------------------------------------------  ------------------------------------------------------------------------------------------------------------------ GFR: Estimated Creatinine Clearance: 26.7 mL/min (A) (by C-G formula based on SCr of 2.38 mg/dL (H)). Liver Function Tests: No results for input(s): AST, ALT, ALKPHOS, BILITOT, PROT, ALBUMIN in the last 168 hours. No results for input(s): LIPASE, AMYLASE in the last 168 hours. No results for input(s): AMMONIA in the last 168 hours. Coagulation Profile: No results for input(s): INR, PROTIME in the last 168 hours. Cardiac Enzymes:  Recent Labs Lab 08/07/16 0320  TROPONINI 0.05*   BNP (last 3 results) No results for input(s): PROBNP in the last 8760 hours. HbA1C: No results for input(s): HGBA1C in the last 72 hours. CBG:  Recent Labs Lab 08/07/16 0519  GLUCAP 537*   Lipid Profile: No results for input(s): CHOL, HDL, LDLCALC, TRIG, CHOLHDL, LDLDIRECT in the last 72 hours. Thyroid Function Tests: No results for input(s): TSH, T4TOTAL, FREET4, T3FREE, THYROIDAB in the last 72 hours. Anemia Panel: No results for input(s): VITAMINB12, FOLATE, FERRITIN, TIBC, IRON, RETICCTPCT in the last 72 hours.  --------------------------------------------------------------------------------------------------------------- Urine analysis:    Component Value Date/Time   COLORURINE STRAW (A) 08/07/2016 0523   APPEARANCEUR CLEAR 08/07/2016 0523   LABSPEC 1.009 08/07/2016 0523   PHURINE 6.0 08/07/2016 0523   GLUCOSEU >=500 (A) 08/07/2016 0523   HGBUR SMALL (A) 08/07/2016 0523   BILIRUBINUR NEGATIVE 08/07/2016 0523   KETONESUR 5 (A) 08/07/2016 0523   PROTEINUR NEGATIVE 08/07/2016 0523   UROBILINOGEN 0.2 08/23/2007 2037   NITRITE NEGATIVE 08/07/2016 0523   LEUKOCYTESUR NEGATIVE 08/07/2016 0523      Imaging Results:    Dg Chest 2 View  Result Date:  08/07/2016 CLINICAL DATA:  Shortness of breath EXAM: CHEST  2 VIEW COMPARISON:  Chest radiograph 07/15/2016 FINDINGS: Mild cardiomegaly is unchanged. There bibasilar opacities that aeration of the lung bases has improved. No pleural effusion or pneumothorax. IMPRESSION: Cardiomegaly and central pulmonary vascular congestion, possibly mild pulmonary edema. No focal consolidation. Electronically Signed   By: Ulyses Jarred M.D.   On: 08/07/2016 03:22    My personal review of EKG: Rhythm NSR, LVH pattern, no change from previous EKG   Assessment & Plan:    Active Problems:   Essential hypertension   Type 2 diabetes mellitus with stage 4 chronic kidney disease (HCC)   Elevated troponin   Hyperglycemia   1. Hyperglycemia- ? Mild DKA as per labs drawn at 3:20 AM. Patient was given 15 units of NovoLog. Will recheck BMP and start patient on glucose stabilizer. Her CO2 is 31. Will discontinue  glucose stabilizer once patient's blood glucose improves. Patient will be observed in stepdown unit. 2. Acute on chronic diastolic CHF /pulmonary edema-mild, patient not requiring oxygen. Start Lasix 40 mg IV every 12 hours for 2 doses. Then consider restarting patient's home dose of torsemide 60 mg twice daily. 3. Diabetes mellitus, type 2- patient takes Lantus 40 units at home and missed her dose on Friday. Once patient is off glucose stabilizer consider restarting Lantus and sliding scale insulin with NovoLog. 4. CAD status post stent placement- continue aspirin,Brillinta, metoprolol, Imdur, Ranexa, Crestor. Patient's troponin was 0.05 in the ED, will cycle troponin every 6 hours 3. 5. Chronic kidney stage IV- patient's creatinine 2.38 around baseline, BUN slightly elevated at 71. Started on IV Lasix. Follow BMP in a.m. 6. Hypertension- blood pressure stable, continue hydralazine, Imdur, Toprol-XL 50 mg twice  a day   DVT Prophylaxis-   Lovenox   AM Labs Ordered, also please review Full Orders  Family  Communication: Admission, patients condition and plan of care including tests being ordered have been discussed with the patient and Her daughter at bedside who indicate understanding and agree with the plan and Code Status.  Code Status:  Full code  Admission status: Observation    Time spent in minutes : 50 minutes   Kregg Cihlar S M.D on 08/07/2016 at 6:16 AM  Between 7am to 7pm - Pager - [phone]. After 7pm go to [email or web address] - password [facility]  [facility] - Office  [phone]

## 2016-08-07 NOTE — Progress Notes (Signed)
Text page given to inform Dr. Roderic Palau that trop levels have went up from 0.04 to 0.49.

## 2016-08-07 NOTE — ED Notes (Signed)
Date and time results received: 08/07/16 0410 (use smartphrase ".now" to insert current time)  Test: Triponin, Glucose Critical Value: 0.05, 661  Name of Provider Notified: EDP  Orders Received? Or Actions Taken?: n/a

## 2016-08-07 NOTE — Progress Notes (Signed)
Patient admitted to the hospital earlier this morning by Dr. Darrick Meigs  Patient seen and examined. Shortness of breath improving and she denies chest pain. She does have 1+ LE edema with clear breath sounds.  Patient was placed in the hospital with shortness of breath, felt to be due to decompensated CHF. She recently had echocardiogram done that showed ejection fraction of 45-50% and grade 2 diastolic dysfunction. She started on treatment with Lasix. She also has significant hyperglycemia and reported that she mixed up her Humalog and Lantus insulins. She's been started on intravenous insulin infusion and blood sugars have since improved. Will transition back to Lantus. Continue sliding scale insulin. Continue diuresis. Troponin is mildly elevated, but suspect this is related to decompensated CHF. She had a cardiac catheterization in 11/2015 at which time medical management was recommended. We'll continue current treatments.  [name]

## 2016-08-07 NOTE — ED Provider Notes (Signed)
[facility] DEPT Provider Note   CSN: [id number] Arrival date & time: 08/07/16  3354     History   Chief Complaint Chief Complaint  Patient presents with  . Shortness of Breath    HPI OVETA IDRIS is a 71 y.o. female.  HPI  This is a 71 year old female with a history of heart failure, renal failure, diabetes, hypertension, hyperlipidemia who presents with shortness of breath. Patient reports onset of shortness of breath this morning. She states that her power went out and she could not use her nebulizer. She states she has not had use her nebulizer since earlier this week. However she does use her albuterol inhaler fairly frequently. She reports having pneumonia 1 month ago" I don't think it's gone away." She reports continued productive cough. No increased leg swelling. No recent changes in diuretic medications. She is a nonsmoker. She denies chest pain. Currently she states that her shortness of breath is better.  Past Medical History:  Diagnosis Date  . Acute CHF (congestive heart failure) ([place]) 07/04/2015  . Acute renal failure superimposed on stage 4 chronic kidney disease ([place]) 07/04/2015  . Allergic rhinitis   . Anxiety   . Constipation   . Diabetes mellitus   . GERD (gastroesophageal reflux disease)   . Heart murmur, systolic   . History of hysterectomy   . Hyperlipidemia   . Hypertension   . Low back pain   . Obesity   . Osteoarthritis   . Overactive bladder     Patient Active Problem List   Diagnosis Date Noted  . Pulmonary edema 07/14/2016  . CKD (chronic kidney disease), stage IV ([place]) 07/14/2016  . Acute respiratory failure with hypoxia ([place]) 07/09/2016  . Acute on chronic systolic CHF (congestive heart failure) ([place]) 12/01/2015  . AKI (acute kidney injury) ([place])   . Ischemia of upper extremity   . Right knee pain   . Subclavian artery stenosis, right ([place]) 07/07/2015  . Acute on chronic combined systolic and diastolic CHF (congestive heart  failure) ([place]) 07/04/2015  . Elevated troponin 07/04/2015  . NSTEMI (non-ST elevated myocardial infarction) ([facility]) 07/04/2015  . Elevated d-dimer 07/04/2015  . Acute respiratory failure with hypercapnia ([place])   . Bilateral lower extremity edema 01/20/2012  . Type 2 diabetes mellitus with stage 4 chronic kidney disease ([place]) 01/21/2011  . Overweight 07/20/2009  . Atherosclerosis of native coronary artery of native heart with unstable angina pectoris ([place]) 10/08/2008  . HEART MURMUR, SYSTOLIC 56/25/6389  . SHOULDER PAIN 02/05/2007  . Hyperlipemia 04/11/2006  . ANXIETY 04/11/2006  . SYNDROME, CARPAL TUNNEL 04/11/2006  . Essential hypertension 04/11/2006  . ALLERGIC RHINITIS 04/11/2006  . GERD 04/11/2006  . Constipation 04/11/2006  . OVERACTIVE BLADDER 04/11/2006  . OSTEOARTHRITIS 04/11/2006  . LOW BACK PAIN 04/11/2006    Past Surgical History:  Procedure Laterality Date  . ABDOMINAL HYSTERECTOMY    . BACK SURGERY     multiple  . CARDIAC CATHETERIZATION  04/04/2006   Est EF of 60%  . CARDIAC CATHETERIZATION N/A 07/04/2015   Procedure: Left Heart Cath and Coronary Angiography;  Surgeon: Troy Sine, MD;  Location: [facility];  Service: Cardiovascular;  Laterality: N/A;  . CARDIAC CATHETERIZATION N/A 07/04/2015   Procedure: Coronary Stent Intervention;  Surgeon: Troy Sine, MD;  Location: [facility];  Service: Cardiovascular;  Laterality: N/A;  . CARDIAC CATHETERIZATION N/A 10/13/2015   Procedure: Left Heart Cath and Coronary Angiography;  Surgeon: Troy Sine, MD;  Location:  [facility];  Service: Cardiovascular;  Laterality: N/A;  . CARDIAC CATHETERIZATION N/A 12/02/2015   Procedure: Left Heart Cath and Coronary Angiography;  Surgeon: Peter M Martinique, MD;  Location: [facility];  Service: Cardiovascular;  Laterality: N/A;  . CARPAL TUNNEL RELEASE    . CERVICAL BIOPSY     cervical lymph node biopsies  . COLONOSCOPY  May 2002   Dr. Irving Shows  :Followup in 5 years, normal exam  . COLONOSCOPY  2008   Dr. Laural Golden: Very redundant colon with mild melanosis coli, splenic flexure polyp biopsy with acute complaint of benign colon polyp. Recommended ten-year followup  . CORONARY STENT PLACEMENT  04/11/2006   2 -- Taxus stents to the circumflex   . PERIPHERAL VASCULAR CATHETERIZATION Right 07/09/2015   Procedure: Upper Extremity Angiography;  Surgeon: Conrad Millington, MD;  Location: [facility];  Service: Cardiovascular;  Laterality: Right;  . PERIPHERAL VASCULAR CATHETERIZATION Right 07/10/2015   Procedure: RIGHT SUBCLAVIAN ARTERY THROMBECTOMY;  Surgeon: Serafina Mitchell, MD;  Location: [facility];  Service: Vascular;  Laterality: Right;  . tendonitis     bilateral elbow  . TRIGGER FINGER RELEASE      OB History    No data available       Home Medications    Prior to Admission medications   Medication Sig Start Date End Date Taking? Authorizing Provider  GuaiFENesin (MUCINEX PO) Take 1 tablet by mouth 2 times daily at 12 noon and 4 pm.   Yes [provider]  acetaminophen (TYLENOL) 500 MG tablet Take 2 tablets (1,000 mg total) by mouth every 6 (six) hours as needed (pain). 07/12/15   Lily Kocher, MD  albuterol (PROVENTIL HFA;VENTOLIN HFA) [phone] Base) MCG/ACT inhaler Inhale 1-2 puffs into the lungs every 6 (six) hours as needed for wheezing or shortness of breath. 04/01/16   Varney Biles, MD  albuterol (PROVENTIL) (2.5 MG/3ML) 0.083% nebulizer solution Take 3 mLs (2.5 mg total) by nebulization every 6 (six) hours as needed for wheezing or shortness of breath. 07/16/16   Elgergawy, Silver Huguenin, MD  ALPRAZolam Duanne Moron) 0.5 MG tablet Take 0.5 mg by mouth as needed for anxiety or sleep.     [provider]  aspirin EC 81 MG tablet Take 81 mg by mouth daily.    [provider]  calcitRIOL (ROCALTROL) 0.25 MCG capsule Take 0.25 mcg by mouth every Monday, Wednesday, and Friday.  03/23/15   [provider]    cetirizine (ZYRTEC) 10 MG tablet Take 10 mg by mouth daily as needed for allergies.    [provider]  ferrous sulfate 325 (65 FE) MG tablet Take 325 mg by mouth daily.    [provider]  hydrALAZINE (APRESOLINE) 25 MG tablet Take 2 tablets (50 mg total) by mouth 3 (three) times daily. 01/21/16   Herminio Commons, MD  insulin degludec (TRESIBA) 100 UNIT/ML SOPN FlexTouch Pen Inject 40 Units into the skin daily at 10 pm.    [provider]  insulin lispro (HUMALOG KWIKPEN) 100 UNIT/ML KiwkPen Inject 0.15-0.21 mLs (15-21 Units total) into the skin 3 (three) times daily. 03/22/16   Cassandria Anger, MD  ipratropium (ATROVENT) 0.06 % nasal spray Place 2 sprays into the nose 2 (two) times daily. 05/24/16   [provider]  isosorbide mononitrate (IMDUR) 60 MG 24 hr tablet Take 1 tablet (60 mg total) by mouth daily. 07/12/15   Lily Kocher, MD  latanoprost (XALATAN) 0.005 %  ophthalmic solution Place 1 drop into both eyes at bedtime.  10/25/11   [provider]  meclizine (ANTIVERT) 25 MG tablet Take 1 tablet by mouth daily. 03/06/16   [provider]  metoprolol succinate (TOPROL-XL) 50 MG 24 hr tablet TAKE ONE TABLET BY MOUTH TWICE DAILY WITH  OR  IMMEDIATELY  FOLLOWING  A  MEAL 04/28/16   Lyda Jester M, PA-C  Multiple Vitamin (MULTIVITAMIN WITH MINERALS) TABS tablet Take 1 tablet by mouth daily.    [provider]  nitroGLYCERIN (NITROSTAT) 0.4 MG SL tablet Place 1 tablet (0.4 mg total) under the tongue every 5 (five) minutes as needed for chest pain. 10/14/15   Lyda Jester M, PA-C  pantoprazole (PROTONIX) 40 MG tablet Take 1 tablet by mouth daily. 06/28/16   [provider]  potassium chloride SA (K-DUR,KLOR-CON) 20 MEQ tablet Take 2 tablets (40 mEq total) by mouth daily. 10/14/15   Rosita Fire, Brittainy M, PA-C  RANEXA 500 MG 12 hr tablet TAKE ONE TABLET BY MOUTH TWICE DAILY 05/04/16   Troy Sine, MD  rosuvastatin  (CRESTOR) 40 MG tablet Take 1 tablet (40 mg total) by mouth at bedtime. 07/12/15   Lily Kocher, MD  ticagrelor (BRILINTA) 90 MG TABS tablet Take 1 tablet (90 mg total) by mouth 2 (two) times daily. 10/14/15   Lyda Jester M, PA-C  torsemide (DEMADEX) 20 MG tablet Take 60 mg oral twice daily, can take , can take an extra 20mg  daily if her weight increases by >3 pounds. 07/16/16   Elgergawy, Silver Huguenin, MD  trimethoprim (TRIMPEX) 100 MG tablet Take 1 tablet by mouth daily. 06/06/16   [provider]    Family History Family History  Problem Relation Age of Onset  . Diabetes Father   . Hypertension Father   . Stroke Father   . Hypertension Brother   . Aneurysm Brother   . Diabetes Brother   . Colon cancer Neg Hx     Social History Social History  Substance Use Topics  . Smoking status: Never Smoker  . Smokeless tobacco: Never Used  . Alcohol use No     Allergies   Ace inhibitors and Codeine   Review of Systems Review of Systems  Constitutional: Negative for fever.  Respiratory: Positive for cough and shortness of breath. Negative for chest tightness.   Cardiovascular: Positive for leg swelling. Negative for chest pain.  Gastrointestinal: Negative for abdominal pain, nausea and vomiting.  Genitourinary: Negative for dysuria.  All other systems reviewed and are negative.    Physical Exam Updated Vital Signs BP (!) 146/67   Pulse 82   Temp 98.2 F (36.8 C) (Oral)   Resp (!) 22   Ht 5\' 11"  (1.803 m)   Wt 88.5 kg (195 lb)   SpO2 92%   BMI 27.20 kg/m   Physical Exam  Constitutional: She is oriented to person, place, and time. She appears well-developed and well-nourished. No distress.  HENT:  Head: Normocephalic and atraumatic.  Cardiovascular: Normal rate, regular rhythm and normal heart sounds.   Pulmonary/Chest: Effort normal and breath sounds normal. No respiratory distress. She has no wheezes.  No respiratory distress, clear breath sounds in all lung  fields  Abdominal: Soft. There is no tenderness.  Musculoskeletal:  Trace bilateral lower extremity edema  Neurological: She is alert and oriented to person, place, and time.  Skin: Skin is warm and dry.  Psychiatric: She has a normal mood and affect.  Nursing note and vitals reviewed.  ED Treatments / Results  Labs (all labs ordered are listed, but only abnormal results are displayed) Labs Reviewed  CBC WITH DIFFERENTIAL/PLATELET - Abnormal; Notable for the following:       Result Value   RBC 3.34 (*)    Hemoglobin 10.5 (*)    HCT 33.8 (*)    MCV 101.2 (*)    RDW 15.6 (*)    Lymphs Abs 0.6 (*)    All other components within normal limits  BASIC METABOLIC PANEL - Abnormal; Notable for the following:    Chloride 90 (*)    Glucose, Bld 661 (*)    BUN 71 (*)    Creatinine, Ser 2.38 (*)    Calcium 10.5 (*)    GFR calc non Af Amer 19 (*)    GFR calc Af Amer 22 (*)    All other components within normal limits  TROPONIN I - Abnormal; Notable for the following:    Troponin I 0.05 (*)    All other components within normal limits  BRAIN NATRIURETIC PEPTIDE - Abnormal; Notable for the following:    B Natriuretic Peptide 356.0 (*)    All other components within normal limits  CBG MONITORING, ED - Abnormal; Notable for the following:    Glucose-Capillary 537 (*)    All other components within normal limits  URINALYSIS, ROUTINE W REFLEX MICROSCOPIC    EKG  EKG Interpretation  Date/Time:  Sunday August 07 2016 03:03:32 EDT Ventricular Rate:  81 PR Interval:    QRS Duration: 118 QT Interval:  406 QTC Calculation: 472 R Axis:   -12 Text Interpretation:  Sinus rhythm Prolonged PR interval Incomplete left bundle branch block Low voltage, extremity leads LVH with secondary repolarization abnormality No significant change since last tracing Confirmed by Thayer Jew [phone]) on 08/07/2016 3:22:22 AM       Radiology Dg Chest 2 View  Result Date: 08/07/2016 CLINICAL DATA:   Shortness of breath EXAM: CHEST  2 VIEW COMPARISON:  Chest radiograph 07/15/2016 FINDINGS: Mild cardiomegaly is unchanged. There bibasilar opacities that aeration of the lung bases has improved. No pleural effusion or pneumothorax. IMPRESSION: Cardiomegaly and central pulmonary vascular congestion, possibly mild pulmonary edema. No focal consolidation. Electronically Signed   By: Ulyses Jarred M.D.   On: 08/07/2016 03:22    Procedures Procedures (including critical care time)  Medications Ordered in ED Medications  ipratropium-albuterol (DUONEB) 0.5-2.5 (3) MG/3ML nebulizer solution 3 mL (3 mLs Nebulization Given 08/07/16 0346)  insulin aspart (novoLOG) injection 15 Units (15 Units Subcutaneous Given 08/07/16 0425)     Initial Impression / Assessment and Plan / ED Course  I have reviewed the triage vital signs and the nursing notes.  Pertinent labs & imaging results that were available during my care of the patient were reviewed by me and considered in my medical decision making (see chart for details).     Patient presents with shortness of breath. History of heart failure. Also reports using a nebulizer at home. She is nontoxic on exam. No acute distress. Vital signs reassuring. She does not appear overtly volume overloaded. However, chest x-ray is suggestive of some mild volume overload. Lab work otherwise notable for a BNP of 356. This appears that the patient's baseline. Creatinine 2.38. Patient's blood glucose noted to be 661. Patient reports that on Friday night she mistakenly took her Humalog 50 units instead of Lantus 50 units. She stayed up all night monitoring her blood sugars. She states they are normally in the 100s  range. She reports taking her Lantus last night as directed. She was unaware that her sugar was so high. Patient was given 15 units of Humalog. She is not a candidate for aggressive hydration given early signs of volume overload on chest x-ray. Repeat blood sugar 537. Patient  states that her blood sugars are highly variable and sometimes she bottle without the morning. Feel she likely needs admission for observation and closely monitored glucose checks with sliding scale insulin to better manage her sugars. Especially given administration of incorrect insulin yesterday. She has no evidence of DKA at this time. Anion gap was 15.    Final Clinical Impressions(s) / ED Diagnoses   Final diagnoses:  SOB (shortness of breath)  Hyperglycemia    New Prescriptions New Prescriptions   No medications on file     Merryl Hacker, MD 08/07/16 [phone]

## 2016-08-08 ENCOUNTER — Encounter (HOSPITAL_COMMUNITY): Payer: Self-pay | Admitting: Physician Assistant

## 2016-08-08 DIAGNOSIS — N184 Chronic kidney disease, stage 4 (severe): Secondary | ICD-10-CM

## 2016-08-08 DIAGNOSIS — Z794 Long term (current) use of insulin: Secondary | ICD-10-CM | POA: Diagnosis not present

## 2016-08-08 DIAGNOSIS — I5043 Acute on chronic combined systolic (congestive) and diastolic (congestive) heart failure: Secondary | ICD-10-CM

## 2016-08-08 DIAGNOSIS — I10 Essential (primary) hypertension: Secondary | ICD-10-CM | POA: Diagnosis not present

## 2016-08-08 DIAGNOSIS — E782 Mixed hyperlipidemia: Secondary | ICD-10-CM

## 2016-08-08 DIAGNOSIS — E1122 Type 2 diabetes mellitus with diabetic chronic kidney disease: Secondary | ICD-10-CM

## 2016-08-08 DIAGNOSIS — I25119 Atherosclerotic heart disease of native coronary artery with unspecified angina pectoris: Secondary | ICD-10-CM

## 2016-08-08 DIAGNOSIS — N183 Chronic kidney disease, stage 3 (moderate): Secondary | ICD-10-CM | POA: Diagnosis not present

## 2016-08-08 DIAGNOSIS — I214 Non-ST elevation (NSTEMI) myocardial infarction: Secondary | ICD-10-CM | POA: Diagnosis not present

## 2016-08-08 DIAGNOSIS — I248 Other forms of acute ischemic heart disease: Secondary | ICD-10-CM | POA: Diagnosis not present

## 2016-08-08 DIAGNOSIS — R748 Abnormal levels of other serum enzymes: Secondary | ICD-10-CM | POA: Diagnosis not present

## 2016-08-08 LAB — GLUCOSE, CAPILLARY
GLUCOSE-CAPILLARY: 99 mg/dL (ref 65–99)
GLUCOSE-CAPILLARY: 332 mg/dL — AB (ref 65–99)
GLUCOSE-CAPILLARY: 298 mg/dL — AB (ref 65–99)
Glucose-Capillary: 313 mg/dL — ABNORMAL HIGH (ref 65–99)
Glucose-Capillary: 244 mg/dL — ABNORMAL HIGH (ref 65–99)
Glucose-Capillary: 129 mg/dL — ABNORMAL HIGH (ref 65–99)
Glucose-Capillary: 66 mg/dL (ref 65–99)
Glucose-Capillary: 77 mg/dL (ref 65–99)
Glucose-Capillary: 158 mg/dL — ABNORMAL HIGH (ref 65–99)

## 2016-08-08 LAB — COMPREHENSIVE METABOLIC PANEL
ALBUMIN: 3.6 g/dL (ref 3.5–5.0)
ALK PHOS: 69 U/L (ref 38–126)
ALT: 19 U/L (ref 14–54)
ANION GAP: 10 (ref 5–15)
AST: 25 U/L (ref 15–41)
BUN: 54 mg/dL — ABNORMAL HIGH (ref 6–20)
CALCIUM: 9.8 mg/dL (ref 8.9–10.3)
CHLORIDE: 98 mmol/L — AB (ref 101–111)
CO2: 30 mmol/L (ref 22–32)
Creatinine, Ser: 1.78 mg/dL — ABNORMAL HIGH (ref 0.44–1.00)
GFR calc Af Amer: 32 mL/min — ABNORMAL LOW (ref >60)
GFR calc non Af Amer: 28 mL/min — ABNORMAL LOW (ref >60)
Glucose, Bld: 104 mg/dL — ABNORMAL HIGH (ref 65–99)
POTASSIUM: 3.8 mmol/L (ref 3.5–5.1)
SODIUM: 138 mmol/L (ref 135–145)
Total Bilirubin: 0.6 mg/dL (ref 0.3–1.2)
Total Protein: 7 g/dL (ref 6.5–8.1)

## 2016-08-08 LAB — CBC
HCT: 32.4 % — ABNORMAL LOW (ref 36.0–46.0)
HEMOGLOBIN: 10.5 g/dL — AB (ref 12.0–15.0)
MCH: 32.1 pg (ref 26.0–34.0)
MCHC: 32.4 g/dL (ref 30.0–36.0)
MCV: 99.1 fL (ref 78.0–100.0)
PLATELETS: 180 10*3/uL (ref 150–400)
RBC: 3.27 MIL/uL — ABNORMAL LOW (ref 3.87–5.11)
RDW: 15.5 % (ref 11.5–15.5)
WBC: 3.9 10*3/uL — ABNORMAL LOW (ref 4.0–10.5)

## 2016-08-08 MED ORDER — ENOXAPARIN SODIUM 40 MG/0.4ML ~~LOC~~ SOLN: 40.0000 mg | SUBCUTANEOUS | Status: DC

## 2016-08-08 MED ORDER — ASPIRIN EC 81 MG PO TBEC: 81.0000 mg | DELAYED_RELEASE_TABLET | Freq: Every day | ORAL | Status: DC

## 2016-08-08 MED ORDER — MAGNESIUM HYDROXIDE 400 MG/5ML PO SUSP
30.0000 mL | ORAL | Status: AC
  Administered 2016-08-08: 30 mL via ORAL
  Filled 2016-08-08: qty 30

## 2016-08-08 MED ORDER — TORSEMIDE 20 MG PO TABS
60.0000 mg | ORAL_TABLET | Freq: Two times a day (BID) | ORAL | Status: DC
  Administered 2016-08-08: 60 mg via ORAL
  Filled 2016-08-08: qty 3

## 2016-08-08 NOTE — Progress Notes (Deleted)
PROGRESS NOTE    [name]  XQJ:[id number] DOB: [birth year] DOA: 08/07/2016 PCP: [name], MD    Brief Narrative:  71 year old female with a history of CHF, chronic kidney disease, coronary artery disease who presented to the hospital with shortness of breath and decompensated CHF. Also had hyperglycemia and mild DKA. Clinically she has improved with diuresis and insulin infusion. Troponin was elevated consistent with a non-STEMI. Cardiology consulted.   Assessment & Plan:   Active Problems:   Hyperlipemia   Essential hypertension   GERD   Type 2 diabetes mellitus with stage 4 chronic kidney disease (HCC)   Acute on chronic combined systolic and diastolic CHF (congestive heart failure) (HCC)   Elevated troponin   NSTEMI (non-ST elevated myocardial infarction) (HCC)   CKD (chronic kidney disease), stage IV (HCC)   Hyperglycemia   1. Acute on chronic combined CHF. Patient presented with shortness of breath. She did have evidence of lower extremity edema. She was started on intravenous Lasix. Intake and output has not been accurately recorded. Weight is down approximately 7 pounds since yesterday, although this may not be accurate. Clinically, she is feeling better. Shortness of breath has resolved. Lower extremity edema is better. Will transition to home dose of torsemide. Continue on metoprolol. Not a candidate for ACE inhibitor should renal dysfunction 2. Chronic kidney disease stage IV. Creatinine has mildly improved with diuresis. Continue to monitor. 3. Type 2 diabetes with hyperglycemia and mild DKA. Patient was started on an insulin infusion on admission. Blood sugars have since improved. She is now back on Lantus and NovoLog. Continue to monitor blood sugars. She reports mixing up her insulins at home which likely resulted in hyperglycemia. 4. Hypertension. Blood pressure stable. Continue current treatments 5. NSTEMI. Troponin peaked at 1.75. No complaints of chest pain.  EKG was nonacute. She is already on aspirin and Plavix. Possibly demand ischemia in the setting of decompensated CHF. Recently had cardiac catheterization 11/2015. We'll request cardiology input regarding any further management. 6. Hyperlipidemia. Continue statin.   DVT prophylaxis: Lovenox Code Status: Full code Family Communication: No family present Disposition Plan: Discharge home once improved   Consultants:     Procedures:     Antimicrobials:       Subjective: Feeling better. Shortness of breath has resolved. No chest pain  Objective: Vitals:   08/08/16 0600 08/08/16 0700 08/08/16 0715 08/08/16 0900  BP: (!) 147/62 (!) 142/113  131/83  Pulse: (!) 58 60 60 63  Resp: 11 11 13 18   Temp:   98.1 F (36.7 C)   TempSrc:   Oral   SpO2: 99% 93% 99% 100%  Weight:      Height:        Intake/Output Summary (Last 24 hours) at 08/08/16 1032 Last data filed at 08/08/16 0947  Gross per 24 hour  Intake            360.3 ml  Output                0 ml  Net            360.3 ml   Filed Weights   08/07/16 0254 08/08/16 0500  Weight: 88.5 kg (195 lb) 85.4 kg (188 lb 4.4 oz)    Examination:  General exam: Appears calm and comfortable  Respiratory system: Clear to auscultation. Respiratory effort normal. Cardiovascular system: S1 & S2 heard, RRR. No JVD, murmurs, rubs, gallops or clicks. No pedal edema. Gastrointestinal system: Abdomen is nondistended, soft  and nontender. No organomegaly or masses felt. Normal bowel sounds heard. Central nervous system: Alert and oriented. No focal neurological deficits. Extremities: Symmetric 5 x 5 power. Skin: No rashes, lesions or ulcers Psychiatry: Judgement and insight appear normal. Mood & affect appropriate.     Data Reviewed: I have personally reviewed following labs and imaging studies  CBC:  Recent Labs Lab 08/07/16 0320 08/07/16 1508 08/08/16 0422  WBC 4.5 4.5 3.9*  NEUTROABS 3.7  --   --   HGB 10.5* 10.5* 10.5*    HCT 33.8* 32.6* 32.4*  MCV 101.2* 99.4 99.1  PLT 180 184 465   Basic Metabolic Panel:  Recent Labs Lab 08/07/16 0320 08/07/16 0707 08/07/16 1429 08/07/16 1508 08/08/16 0422  NA 136 134* 143  --  138  K 4.3 3.4* 3.4*  --  3.8  CL 90* 91* 96*  --  98*  CO2 31 29 33*  --  30  GLUCOSE 661* 482* 127*  --  104*  BUN 71* 68* 63*  --  54*  CREATININE 2.38* 2.34* 2.12* 2.00* 1.78*  CALCIUM 10.5* 10.1 10.5*  --  9.8   GFR: Estimated Creatinine Clearance: 35.1 mL/min (A) (by C-G formula based on SCr of 1.78 mg/dL (H)). Liver Function Tests:  Recent Labs Lab 08/08/16 0422  AST 25  ALT 19  ALKPHOS 69  BILITOT 0.6  PROT 7.0  ALBUMIN 3.6   No results for input(s): LIPASE, AMYLASE in the last 168 hours. No results for input(s): AMMONIA in the last 168 hours. Coagulation Profile: No results for input(s): INR, PROTIME in the last 168 hours. Cardiac Enzymes:  Recent Labs Lab 08/07/16 0320 08/07/16 0707 08/07/16 1429 08/07/16 2052  TROPONINI 0.05* 0.49* 1.75* 1.13*   BNP (last 3 results) No results for input(s): PROBNP in the last 8760 hours. HbA1C: No results for input(s): HGBA1C in the last 72 hours. CBG:  Recent Labs Lab 08/07/16 0815 08/07/16 1118 08/07/16 1627 08/07/16 2106 08/08/16 0713  GLUCAP 388* 191* 172* 320* 99   Lipid Profile: No results for input(s): CHOL, HDL, LDLCALC, TRIG, CHOLHDL, LDLDIRECT in the last 72 hours. Thyroid Function Tests: No results for input(s): TSH, T4TOTAL, FREET4, T3FREE, THYROIDAB in the last 72 hours. Anemia Panel: No results for input(s): VITAMINB12, FOLATE, FERRITIN, TIBC, IRON, RETICCTPCT in the last 72 hours. Sepsis Labs: No results for input(s): PROCALCITON, LATICACIDVEN in the last 168 hours.  No results found for this or any previous visit (from the past 240 hour(s)).       Radiology Studies: Dg Chest 2 View  Result Date: 08/07/2016 CLINICAL DATA:  Shortness of breath EXAM: CHEST  2 VIEW COMPARISON:  Chest  radiograph 07/15/2016 FINDINGS: Mild cardiomegaly is unchanged. There bibasilar opacities that aeration of the lung bases has improved. No pleural effusion or pneumothorax. IMPRESSION: Cardiomegaly and central pulmonary vascular congestion, possibly mild pulmonary edema. No focal consolidation. Electronically Signed   By: Ulyses Jarred M.D.   On: 08/07/2016 03:22        Scheduled Meds: . aspirin  325 mg Oral Daily  . calcitRIOL  0.25 mcg Oral Q M,W,F  . enoxaparin (LOVENOX) injection  40 mg Subcutaneous Q24H  . ferrous sulfate  325 mg Oral Daily  . furosemide  40 mg Intravenous BID  . hydrALAZINE  50 mg Oral TID  . insulin aspart  0-20 Units Subcutaneous TID WC  . insulin aspart  0-5 Units Subcutaneous QHS  . insulin glargine  40 Units Subcutaneous Q24H  . isosorbide  mononitrate  60 mg Oral Daily  . latanoprost  1 drop Both Eyes QHS  . loratadine  10 mg Oral Daily  . magnesium hydroxide  30 mL Oral NOW  . meclizine  25 mg Oral Daily  . metoprolol succinate  50 mg Oral BID  . pantoprazole  40 mg Oral Daily  . potassium chloride SA  40 mEq Oral Daily  . ranolazine  500 mg Oral BID  . rosuvastatin  40 mg Oral QHS  . ticagrelor  90 mg Oral BID  . trimethoprim  100 mg Oral Daily   Continuous Infusions: . dextrose 5 % and 0.45% NaCl 10 mL/hr at 08/07/16 1038     LOS: 0 days    Time spent: 35mins    Blakely Maranan, MD [facility] Pager [phone]  If 7PM-7AM, please contact night-coverage [email or web address] Password [email or web address] 08/08/2016, 10:32 AM

## 2016-08-08 NOTE — Discharge Summary (Signed)
Physician Discharge Summary  [name] HYW:[id number] DOB: 1945-05-26 DOA: 08/07/2016  PCP: [name], MD  Admit date: 08/07/2016 Discharge date: 08/08/2016  Admitted From: home Disposition:  home  Recommendations for Outpatient Follow-up:  1. Follow up with PCP in 1-2 weeks 2. Please obtain BMP/CBC in one week 3. Follow-up with cardiology on 6/12 4. Follow-up with endocrinology in 1-2 weeks.  Home Health: Equipment/Devices:  Discharge Condition: stable CODE STATUS: full code Diet recommendation: Heart Healthy / Carb Modified   Brief/Interim Summary: 71 year old female with a history of CHF, chronic kidney disease, coronary artery disease who presented to the hospital with shortness of breath and decompensated CHF. Also had hyperglycemia and mild DKA. Clinically she has improved with diuresis and insulin infusion. Troponin was elevated consistent with a non-STEMI.  Discharge Diagnoses:  Active Problems:   Hyperlipemia   Essential hypertension   GERD   Type 2 diabetes mellitus with stage 4 chronic kidney disease (HCC)   Acute on chronic combined systolic and diastolic CHF (congestive heart failure) (HCC)   Elevated troponin   NSTEMI (non-ST elevated myocardial infarction) (HCC)   CKD (chronic kidney disease), stage IV (HCC)   Hyperglycemia  1. Acute on chronic combined CHF. Patient presented with shortness of breath. She did have evidence of lower extremity edema. She was started on intravenous Lasix. Intake and output has not been accurately recorded. Weight is down approximately 7 pounds since yesterday, although this may not be accurate. Clinically, she is feeling better. Shortness of breath has resolved. Lower extremity edema is better. Will transition back to home dose of torsemide. Continue on metoprolol. Not a candidate for ACE inhibitor should renal dysfunction 2. Chronic kidney disease stage IV. Creatinine has mildly improved with diuresis. Continue to  monitor. 3. Type 2 diabetes with hyperglycemia and mild DKA. Patient was initially started on an insulin infusion on admission. Blood sugars have since improved. She is now back on Lantus and NovoLog. Continue to monitor blood sugars. She reports mixing up her insulins at home which likely resulted in hyperglycemia. 4. Hypertension. Blood pressure stable. Continue current treatments 5. NSTEMI. Troponin peaked at 1.75. No complaints of chest pain. EKG was nonacute. She is already on aspirin and Plavix. Possibly demand ischemia in the setting of decompensated CHF. Recently had cardiac catheterization 11/2015. Seen by cardiology and no further invasive testing planned at this time. She will be continued on medical regimen. She has been scheduled close follow up with cardiology. 6. Hyperlipidemia. Continue statin.  Discharge Instructions  Discharge Instructions    Diet - low sodium heart healthy    Complete by:  As directed    Increase activity slowly    Complete by:  As directed      Allergies as of 08/08/2016      Reactions   Ace Inhibitors Cough   Amlodipine    Edema   Codeine Rash      Medication List    TAKE these medications   acetaminophen 500 MG tablet Commonly known as:  TYLENOL Take 2 tablets (1,000 mg total) by mouth every 6 (six) hours as needed (pain).   albuterol 108 (90 Base) MCG/ACT inhaler Commonly known as:  PROVENTIL HFA;VENTOLIN HFA Inhale 1-2 puffs into the lungs every 6 (six) hours as needed for wheezing or shortness of breath.   albuterol (2.5 MG/3ML) 0.083% nebulizer solution Commonly known as:  PROVENTIL Take 3 mLs (2.5 mg total) by nebulization every 6 (six) hours as needed for wheezing or shortness of breath.  ALPRAZolam 0.5 MG tablet Commonly known as:  XANAX Take 0.5 mg by mouth daily as needed for anxiety or sleep.   aspirin EC 81 MG tablet Take 81 mg by mouth daily.   calcitRIOL 0.25 MCG capsule Commonly known as:  ROCALTROL Take 0.25 mcg by  mouth every Monday, Wednesday, and Friday.   cetirizine 10 MG tablet Commonly known as:  ZYRTEC Take 10 mg by mouth daily as needed for allergies.   ferrous sulfate 325 (65 FE) MG tablet Take 325 mg by mouth daily with breakfast.   guaiFENesin 600 MG 12 hr tablet Commonly known as:  MUCINEX Take 600 mg by mouth 2 (two) times daily.   hydrALAZINE 25 MG tablet Commonly known as:  APRESOLINE Take 2 tablets (50 mg total) by mouth 3 (three) times daily.   insulin degludec 100 UNIT/ML Sopn FlexTouch Pen Commonly known as:  TRESIBA Inject 40 Units into the skin at bedtime.   insulin lispro 100 UNIT/ML KiwkPen Commonly known as:  HUMALOG KWIKPEN Inject 0.15-0.21 mLs (15-21 Units total) into the skin 3 (three) times daily. What changed:  additional instructions   ipratropium 0.06 % nasal spray Commonly known as:  ATROVENT Place 2 sprays into both nostrils 2 (two) times daily as needed for rhinitis.   isosorbide mononitrate 60 MG 24 hr tablet Commonly known as:  IMDUR Take 1 tablet (60 mg total) by mouth daily.   latanoprost 0.005 % ophthalmic solution Commonly known as:  XALATAN Place 1 drop into both eyes at bedtime.   meclizine 25 MG tablet Commonly known as:  ANTIVERT Take 12.5 mg by mouth 2 (two) times daily.   metoprolol succinate 50 MG 24 hr tablet Commonly known as:  TOPROL-XL TAKE ONE TABLET BY MOUTH TWICE DAILY WITH  OR  IMMEDIATELY  FOLLOWING  A  MEAL   multivitamin with minerals Tabs tablet Take 1 tablet by mouth daily.   nitroGLYCERIN 0.4 MG SL tablet Commonly known as:  NITROSTAT Place 1 tablet (0.4 mg total) under the tongue every 5 (five) minutes as needed for chest pain.   pantoprazole 40 MG tablet Commonly known as:  PROTONIX Take 40 mg by mouth daily.   potassium chloride SA 20 MEQ tablet Commonly known as:  K-DUR,KLOR-CON Take 2 tablets (40 mEq total) by mouth daily.   RANEXA 500 MG 12 hr tablet Generic drug:  ranolazine TAKE ONE TABLET BY  MOUTH TWICE DAILY   rosuvastatin 40 MG tablet Commonly known as:  CRESTOR Take 1 tablet (40 mg total) by mouth at bedtime.   ticagrelor 90 MG Tabs tablet Commonly known as:  BRILINTA Take 1 tablet (90 mg total) by mouth 2 (two) times daily.   torsemide 20 MG tablet Commonly known as:  DEMADEX Take 60 mg by mouth 2 (two) times daily. Pt is able to take an additional tablet daily for weight gain greater than 3lbs.   trimethoprim 100 MG tablet Commonly known as:  TRIMPEX Take 100 mg by mouth daily.       Allergies  Allergen Reactions  . Ace Inhibitors Cough  . Amlodipine     Edema   . Codeine Rash    Consultations:  Cardiology   Procedures/Studies: Dg Chest 2 View  Result Date: 08/07/2016 CLINICAL DATA:  Shortness of breath EXAM: CHEST  2 VIEW COMPARISON:  Chest radiograph 07/15/2016 FINDINGS: Mild cardiomegaly is unchanged. There bibasilar opacities that aeration of the lung bases has improved. No pleural effusion or pneumothorax. IMPRESSION: Cardiomegaly and central pulmonary vascular  congestion, possibly mild pulmonary edema. No focal consolidation. Electronically Signed   By: Ulyses Jarred M.D.   On: 08/07/2016 03:22   Dg Chest 2 View  Result Date: 07/15/2016 CLINICAL DATA:  Shortness of Breath, chest pain EXAM: CHEST  2 VIEW COMPARISON:  07/14/2016 FINDINGS: There are small to moderate bilateral pleural effusions. Bilateral lower lobe atelectasis or infiltrates. Aeration has improved since prior study. Heart is borderline in size. IMPRESSION: Small to moderate bilateral effusions with bibasilar opacities. Overall aeration and airspace disease improved since prior study. Electronically Signed   By: Rolm Baptise M.D.   On: 07/15/2016 10:16   Dg Chest Portable 1 View  Result Date: 07/14/2016 CLINICAL DATA:  71 y/o  F; acute dyspnea. EXAM: PORTABLE CHEST 1 VIEW COMPARISON:  07/09/2016 chest radiograph FINDINGS: Increased hazy opacification of the lungs and lower lobe  consolidations probably representing pulmonary edema. Cardiac silhouette is obscured by airspace disease. Underlying pneumonia is not excluded. No acute osseous abnormality identified. IMPRESSION: Increased hazy opacification of the lungs and lower lobe consolidations probably representing pulmonary edema. Cardiac silhouette is obscured by airspace disease. Underlying pneumonia is not excluded. Electronically Signed   By: Kristine Garbe M.D.   On: 07/14/2016 06:31       Subjective: Shortness of breath. No chest pain  Discharge Exam: Vitals:   08/08/16 1400 08/08/16 1630  BP: 127/63   Pulse: 61 (!) 57  Resp: 14 12  Temp:  98.3 F (36.8 C)   Vitals:   08/08/16 1200 08/08/16 1300 08/08/16 1400 08/08/16 1630  BP: (!) 147/122 (!) 152/65 127/63   Pulse: 63 61 61 (!) 57  Resp: 13 13 14 12   Temp:    98.3 F (36.8 C)  TempSrc:    Oral  SpO2: 100% 96% 100% 100%  Weight:      Height:        General: Pt is alert, awake, not in acute distress Cardiovascular: RRR, S1/S2 +, no rubs, no gallops Respiratory: CTA bilaterally, no wheezing, no rhonchi Abdominal: Soft, NT, ND, bowel sounds + Extremities: no edema, no cyanosis    The results of significant diagnostics from this hospitalization (including imaging, microbiology, ancillary and laboratory) are listed below for reference.     Microbiology: No results found for this or any previous visit (from the past 240 hour(s)).   Labs: BNP (last 3 results)  Recent Labs  07/09/16 0402 07/14/16 0632 08/07/16 0328  BNP 393.0* 345.0* 557.3*   Basic Metabolic Panel:  Recent Labs Lab 08/07/16 0320 08/07/16 0707 08/07/16 1429 08/07/16 1508 08/08/16 0422  NA 136 134* 143  --  138  K 4.3 3.4* 3.4*  --  3.8  CL 90* 91* 96*  --  98*  CO2 31 29 33*  --  30  GLUCOSE 661* 482* 127*  --  104*  BUN 71* 68* 63*  --  54*  CREATININE 2.38* 2.34* 2.12* 2.00* 1.78*  CALCIUM 10.5* 10.1 10.5*  --  9.8   Liver Function  Tests:  Recent Labs Lab 08/08/16 0422  AST 25  ALT 19  ALKPHOS 69  BILITOT 0.6  PROT 7.0  ALBUMIN 3.6   No results for input(s): LIPASE, AMYLASE in the last 168 hours. No results for input(s): AMMONIA in the last 168 hours. CBC:  Recent Labs Lab 08/07/16 0320 08/07/16 1508 08/08/16 0422  WBC 4.5 4.5 3.9*  NEUTROABS 3.7  --   --   HGB 10.5* 10.5* 10.5*  HCT 33.8* 32.6* 32.4*  MCV 101.2* 99.4 99.1  PLT 180 184 180   Cardiac Enzymes:  Recent Labs Lab 08/07/16 0320 08/07/16 0707 08/07/16 1429 08/07/16 2052  TROPONINI 0.05* 0.49* 1.75* 1.13*   BNP: Invalid input(s): POCBNP CBG:  Recent Labs Lab 08/07/16 1627 08/07/16 2106 08/08/16 0713 08/08/16 1121 08/08/16 1629  GLUCAP 172* 320* 99 332* 298*   D-Dimer No results for input(s): DDIMER in the last 72 hours. Hgb A1c No results for input(s): HGBA1C in the last 72 hours. Lipid Profile No results for input(s): CHOL, HDL, LDLCALC, TRIG, CHOLHDL, LDLDIRECT in the last 72 hours. Thyroid function studies No results for input(s): TSH, T4TOTAL, T3FREE, THYROIDAB in the last 72 hours.  Invalid input(s): FREET3 Anemia work up No results for input(s): VITAMINB12, FOLATE, FERRITIN, TIBC, IRON, RETICCTPCT in the last 72 hours. Urinalysis    Component Value Date/Time   COLORURINE STRAW (A) 08/07/2016 0523   APPEARANCEUR CLEAR 08/07/2016 0523   LABSPEC 1.009 08/07/2016 0523   PHURINE 6.0 08/07/2016 0523   GLUCOSEU >=500 (A) 08/07/2016 0523   HGBUR SMALL (A) 08/07/2016 0523   BILIRUBINUR NEGATIVE 08/07/2016 0523   KETONESUR 5 (A) 08/07/2016 0523   PROTEINUR NEGATIVE 08/07/2016 0523   UROBILINOGEN 0.2 08/23/2007 2037   NITRITE NEGATIVE 08/07/2016 0523   LEUKOCYTESUR NEGATIVE 08/07/2016 0523   Sepsis Labs Invalid input(s): PROCALCITONIN,  WBC,  LACTICIDVEN Microbiology No results found for this or any previous visit (from the past 240 hour(s)).   Time coordinating discharge: Over 30  minutes  SIGNED:   Kathie Dike, MD  [facility] 08/08/2016, 4:41 PM Pager   If 7PM-7AM, please contact night-coverage [email or web address] Password [email or web address]

## 2016-08-08 NOTE — Consult Note (Signed)
Cardiology Consultation:   Patient ID: GERALDYNE [name]; [id number]; 1945/05/13   Admit date: 08/07/2016 Date of Consult: 08/08/2016  Primary Care Provider: [name], MD Primary Cardiologist: Dr. Bronson Ing  Patient Profile:   [name] is a 71 y.o. 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 Roderic Palau for CHF and elevated troponin.  History of Present Illness:   Last cath 11/2015 showed 60% mLAD, 99% prox-midCx (chronic occlusion), 30% dRCA, 40% pRCA, 80% ostial-prox Cx, LVEDP moderately elevated - recommended to treat medically. More recently she was admitted early 07/2016 with acute hypercapnic/hypoxic respiratory failure requiring NIPPV, marked hyperglycemia, acute combined CHF, troponin of 1.7 felt due to demand ischemia. DC wt 190lb. 2D echo 07/11/16 showed EF 45-50% with hypokinesis of the distal anterior wall; severe hypokinesis/akinesis of the mid/distal lateral wall, mild LVH, grade 2 DD, difficult windows - similar wall motion to 11/2015.   She reports strict compliance with her diabetic regimen but relays a hx of labile CBGs. On Friday she accidentally took Humalog bolus qhs instead of Lantus so contacted the on-call PCP who instructed her drink some orange juice with sugar and follow BS hourly. She did so and did not note any issues. However, yesterday AM she woke up and noticed she was mildly more SOB than usual and also thought she could hear wheezing. She recalled being told that if she thought she was wheezing to come straight to the ER. Denies any orthopnea, PND, chest pain. She had mild edema but has resolved since admission. Labs  notable for Cr 2.12->1.78, K 3.4->3.8, LFTs wnl, troponin 0.05->0.49->1.75->1.13, Hgb 10.5, initial CBG 661, BNP 356. UOP not recorded. Felt to have mild DKA with anion gap of 15. CXR c/w cardiomegaly and central pulmonary vascular congestion, possibly mild pulmonary edema. She was started on Lasix 40mg  IV BID thus far. BP labile - 104/86->157/68 most recently. Telemetry unremarkable. She's eating lunch without any acute complaint.   Past Medical History:  Diagnosis Date  . Acute renal failure superimposed on stage 4 chronic kidney disease ([place]) 07/04/2015  . Allergic rhinitis   . Anemia   . Anxiety   . CAD in native artery    NSTEMI 07/2015 s/p DES to RCA and posterior PDA, PCI 10/2015 with scoring balloon to 85% ISR of distal RCA), known LAD/Cx disease treated medically  . Carotid artery disease ([place])    Mild bilateral carotid disease (1-39% 07/2015)  . Chronic combined systolic and diastolic CHF (congestive heart failure) ([place])   . CKD (chronic kidney disease), stage IV ([place])   . Constipation   . Diabetes mellitus   . GERD (gastroesophageal reflux disease)   . Heart murmur, systolic   . History of hysterectomy   . Hyperlipidemia   . Hypertension   . Low back pain   . Obesity   . 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 Osteoarthritis   . Overactive bladder   . PVC's (premature ventricular contractions)     Past Surgical History:  Procedure Laterality Date  . ABDOMINAL HYSTERECTOMY    . BACK SURGERY  multiple  . CARDIAC CATHETERIZATION  04/04/2006   Est EF of 60%  . CARDIAC CATHETERIZATION N/A 07/04/2015   Procedure: Left Heart Cath and Coronary Angiography;  Surgeon: Troy Sine, MD;  Location: [facility];  Service: Cardiovascular;  Laterality: N/A;  . CARDIAC CATHETERIZATION N/A 07/04/2015   Procedure: Coronary Stent Intervention;  Surgeon: Troy Sine, MD;  Location: [facility];  Service: Cardiovascular;  Laterality: N/A;  . CARDIAC CATHETERIZATION N/A 10/13/2015   Procedure: Left Heart Cath and Coronary Angiography;  Surgeon: Troy Sine, MD;  Location: [facility];  Service: Cardiovascular;  Laterality: N/A;  . CARDIAC CATHETERIZATION N/A 12/02/2015   Procedure: Left Heart Cath and Coronary Angiography;  Surgeon: Peter M Martinique, MD;  Location: [facility];  Service: Cardiovascular;  Laterality: N/A;  . CARPAL TUNNEL RELEASE    . CERVICAL BIOPSY     cervical lymph node biopsies  . COLONOSCOPY  May 2002   Dr. Irving Shows :Followup in 5 years, normal exam  . COLONOSCOPY  2008   Dr. Laural Golden: Very redundant colon with mild melanosis coli, splenic flexure polyp biopsy with acute complaint of benign colon polyp. Recommended ten-year followup  . CORONARY STENT PLACEMENT  04/11/2006   2 -- Taxus stents to the circumflex   . PERIPHERAL VASCULAR CATHETERIZATION Right 07/09/2015   Procedure: Upper Extremity Angiography;  Surgeon: Conrad Red Lion, MD;  Location: [facility];  Service: Cardiovascular;  Laterality: Right;  . PERIPHERAL VASCULAR CATHETERIZATION Right 07/10/2015   Procedure: RIGHT SUBCLAVIAN ARTERY THROMBECTOMY;  Surgeon: Serafina Mitchell, MD;  Location: [facility];  Service: Vascular;  Laterality: Right;  . tendonitis     bilateral elbow  . TRIGGER FINGER RELEASE       Inpatient Medications: Scheduled Meds: . aspirin EC  81 mg Oral Daily  . calcitRIOL  0.25 mcg Oral Q M,W,F  . enoxaparin (LOVENOX) injection  40 mg Subcutaneous Q24H  . ferrous sulfate  325 mg Oral Daily  . hydrALAZINE  50 mg Oral TID  . insulin aspart  0-20 Units Subcutaneous TID WC  . insulin aspart  0-5 Units Subcutaneous QHS  . insulin glargine  40 Units Subcutaneous Q24H  . isosorbide mononitrate  60 mg Oral Daily  . latanoprost  1 drop Both Eyes QHS  . loratadine  10 mg Oral Daily  . meclizine  25 mg Oral Daily  . metoprolol succinate  50 mg Oral BID  .  pantoprazole  40 mg Oral Daily  . potassium chloride SA  40 mEq Oral Daily  . ranolazine  500 mg Oral BID  . rosuvastatin  40 mg Oral QHS  . ticagrelor  90 mg Oral BID  . torsemide  60 mg Oral BID  . trimethoprim  100 mg Oral Daily   Continuous Infusions: . dextrose 5 % and 0.45% NaCl 10 mL/hr at 08/07/16 1038   PRN Meds: acetaminophen **OR** acetaminophen, ALPRAZolam, dextrose, ondansetron **OR** ondansetron (ZOFRAN) IV  Allergies:    Allergies  Allergen Reactions  . Ace Inhibitors Cough  . Amlodipine     Edema   . Codeine Rash    Social History:   Social History   Social History  . Marital status: Married    Spouse name: N/A  . Number of children: 3  . Years of education: N/A   Occupational History  . Not on file.   Social History Main Topics  . Smoking status: Never Smoker  .  Smokeless tobacco: Never Used  . Alcohol use No  . Drug use: No  . Sexual activity: Not on file   Other Topics Concern  . Not on file   Social History Narrative  . No narrative on file    Family History:   The patient's family history includes Aneurysm in her brother; Diabetes in her brother and father; Hypertension in her brother and father; Stroke in her father. There is no history of Colon cancer.  ROS:  Please see the history of present illness.  All other ROS reviewed and negative.     Physical Exam/Data:   Vitals:   08/08/16 0715 08/08/16 0900 08/08/16 1000 08/08/16 1121  BP:  131/83 (!) 157/68   Pulse: 60 63 67 66  Resp: 13 18 16 19   Temp: 98.1 F (36.7 C)   98 F (36.7 C)  TempSrc: Oral   Oral  SpO2: 99% 100% 97% 100%  Weight:      Height:        Intake/Output Summary (Last 24 hours) at 08/08/16 1233 Last data filed at 08/08/16 0947  Gross per 24 hour  Intake           334.22 ml  Output                0 ml  Net           334.22 ml   Filed Weights   08/07/16 0254 08/08/16 0500  Weight: 195 lb (88.5 kg) 188 lb 4.4 oz (85.4 kg)   Body mass index is 26.26  kg/m.  General: Well developed, well nourished AAF, in no acute distress. Head: Normocephalic, atraumatic, sclera non-icteric, no xanthomas, nares are without discharge Neck: Negative for carotid bruits. JVD not elevated. Lungs: Decreased BS at bases, no wheezes, rales, or rhonchi. Breathing is unlabored. Heart: RRR with S1 S2. No murmurs, rubs, or gallops appreciated. Abdomen: Soft, non-tender, non-distended with normoactive bowel sounds. No hepatomegaly. No rebound/guarding. No obvious abdominal masses. Msk:  Strength and tone appear normal for age. Extremities: No clubbing or cyanosis. No edema.  Distal pedal pulses are 2+ and equal bilaterally. Neuro: Alert and oriented X 3. No facial asymmetry. No focal deficit. Moves all extremities spontaneously. Psych:  Responds to questions appropriately with a normal affect.  EKG:  The EKG was personally reviewed and demonstrates NSR 81bpm, incomplete LBBB, nonspecific St-T changes, downsloped TWI V5-V6 similar top rior  Relevant CV Studies: See above  Laboratory Data:  Chemistry  Recent Labs Lab 08/07/16 0707 08/07/16 1429 08/07/16 1508 08/08/16 0422  NA 134* 143  --  138  K 3.4* 3.4*  --  3.8  CL 91* 96*  --  98*  CO2 29 33*  --  30  GLUCOSE 482* 127*  --  104*  BUN 68* 63*  --  54*  CREATININE 2.34* 2.12* 2.00* 1.78*  CALCIUM 10.1 10.5*  --  9.8  GFRNONAA 20* 22* 24* 28*  GFRAA 23* 26* 28* 32*  ANIONGAP 14 14  --  10     Recent Labs Lab 08/08/16 0422  PROT 7.0  ALBUMIN 3.6  AST 25  ALT 19  ALKPHOS 69  BILITOT 0.6   Hematology  Recent Labs Lab 08/07/16 0320 08/07/16 1508 08/08/16 0422  WBC 4.5 4.5 3.9*  RBC 3.34* 3.28* 3.27*  HGB 10.5* 10.5* 10.5*  HCT 33.8* 32.6* 32.4*  MCV 101.2* 99.4 99.1  MCH 31.4 32.0 32.1  MCHC 31.1 32.2 32.4  RDW 15.6* 15.1  15.5  PLT 180 184 180   Cardiac Enzymes  Recent Labs Lab 08/07/16 0320 08/07/16 0707 08/07/16 1429 08/07/16 2052  TROPONINI 0.05* 0.49* 1.75* 1.13*    No results for input(s): TROPIPOC in the last 168 hours.  BNP  Recent Labs Lab 08/07/16 0328  BNP 356.0*    DDimer No results for input(s): DDIMER in the last 168 hours.  Radiology/Studies:  Dg Chest 2 View  Result Date: 08/07/2016 CLINICAL DATA:  Shortness of breath EXAM: CHEST  2 VIEW COMPARISON:  Chest radiograph 07/15/2016 FINDINGS: Mild cardiomegaly is unchanged. There bibasilar opacities that aeration of the lung bases has improved. No pleural effusion or pneumothorax. IMPRESSION: Cardiomegaly and central pulmonary vascular congestion, possibly mild pulmonary edema. No focal consolidation. Electronically Signed   By: Ulyses Jarred M.D.   On: 08/07/2016 03:22    Assessment and Plan:   1. Mild acute on chronic combined CHF 2. Elevated troponin (recently in 07/2016, now again this admission to 1.75) 3. Labile uncontrolled DM with mild DKA this admission 4. CKD stage III-IV, followed by nephrology 5. Chronic anemia, appears at baseline 6. Known CAD s/p prior PCI with residual disease managed medically  Clinical presentation per patient was for mild dyspnea which has now improved. She denies any edema, orthopnea or significant weight gain. She has not had any chest pain. Clinically she appears near euvolemic. She reports compliance with home meds which includes DAPT. She is on lovenox this admission. I will review further workup with Dr. Domenic Polite.   Signed, Melina Copa, PA-C 08/08/2016 12:33 PM   Attending note:  Patient seen and examined. Reviewed records and discussed the case with Mrs. Dunn PA-C . Patient presents with recent onset shortness of breath and wheezing, was not able to use her nebulizer at home as her power had gone out, ultimately came to the ER. She did not report any obvious angina symptoms during this time. She was noted to have hyperglycemia and DKA at presentation as well. She has been managed for acute on chronic combined heart failure, most recent LVEF 45-50%.  Most recent cardiac catheterization in September 2017 showed moderate mid LAD disease with an occluded mid circumflex and patent stent sites within the mid to distal RCA and PDA. Medical therapy was recommended. She has not had any obvious weight gain or leg edema, reports compliance with her medications.  On examination she is comfortable, eating lunch. Does not report any chest pain and states that her breathing is better. Systolic blood pressure in the 140s to 150s, heart rate in the 60s in sinus rhythm by telemetry. Cardiac exam reveals RRR without gallop. Lungs are clear without wheezing. Extremities show no pitting edema. Lab work shows creatinine 2.0 down to 1.78, peak troponin I 1.75 in the absence of chest pain, I personally reviewed her ECG from 08/07/2016 shows sinus rhythm with prolonged PR interval, IVCD, and repolarization abnormalities. Chest x-ray reported mild edema initially with cardiomegaly.  Suspected component of acute on chronic combined heart failure with demand ischemia and peak troponin up to 1.75, higher than at last admission so NSTEMI is consideration although no chest pain. Cardiac catheterization from September 2017 is noted above with plan for medical therapy. ECG does not show acute changes. Complicating the picture is chronic renal insufficiency, her creatinine is 1.78 at this time. Current cardiac regimen includes aspirin, Brlinta, Crestor, Ranexa, Demadex, Toprol-XL, potassium supplements, hydralazine, Imdur, and Lovenox. She does feel better at this time, suggest transfer to telemetry and increase ambulation  to see how she does. Not entirely clear that we need to pursue follow-up ischemic testing now, although she could have had a change in coronary anatomy over the last month with recurring symptoms and will need to be followed closely. Her medical regimen looks to be otherwise reasonable at this point.  Satira Sark, M.D., F.A.C.C.

## 2016-08-08 NOTE — Care Management Note (Signed)
Case Management Note  Patient Details  Name: NATHALY DAWKINS MRN: [id number] Date of Birth: [birth year]  Subjective/Objective:  Adm with Hyperglycemia. From home, ind PTA. No HH PTA. Patient has working CBG meter and f/u with Dr. Dorris Fetch.                  Action/Plan: Anticipate DC home with self care. No CM needs.   Expected Discharge Date:  08/08/16               Expected Discharge Plan:  Home/Self Care  In-House Referral:     Discharge planning Services  CM Consult  Post Acute Care Choice:    Choice offered to:  NA  DME Arranged:    DME Agency:     HH Arranged:    HH Agency:     Status of Service:  Completed, signed off  If discussed at [employer] of Stay Meetings, dates discussed:    Additional Comments:  Carzell Saldivar, Chauncey Reading, RN 08/08/2016, 12:17 PM

## 2016-08-09 ENCOUNTER — Ambulatory Visit: Payer: BLUE CROSS/BLUE SHIELD | Admitting: "Endocrinology

## 2016-08-12 ENCOUNTER — Other Ambulatory Visit: Payer: Self-pay | Admitting: *Deleted

## 2016-08-12 NOTE — Patient Outreach (Signed)
[facility]) Care Management  08/12/2016  [name] [birth year] [id number]   Transition of care Pt states she is doing well HAs her nebulizer and medications and no home health after d/c  Agreed to [facility] Cm MD visit 08/23/16   Plan to see pt in 1-2 weeks at Dr Dorris Fetch office  [name] Millin L. Lavina Hamman, RN, BSN, [facility] Care Management [phone]

## 2016-08-16 ENCOUNTER — Ambulatory Visit (INDEPENDENT_AMBULATORY_CARE_PROVIDER_SITE_OTHER): Payer: BLUE CROSS/BLUE SHIELD | Admitting: Adult Health

## 2016-08-16 ENCOUNTER — Encounter: Payer: Self-pay | Admitting: Adult Health

## 2016-08-16 VITALS — BP 112/60 | HR 68 | Ht 71.0 in | Wt 193.0 lb

## 2016-08-16 DIAGNOSIS — I10 Essential (primary) hypertension: Secondary | ICD-10-CM | POA: Diagnosis not present

## 2016-08-16 DIAGNOSIS — I5042 Chronic combined systolic (congestive) and diastolic (congestive) heart failure: Secondary | ICD-10-CM | POA: Diagnosis not present

## 2016-08-16 DIAGNOSIS — I2510 Atherosclerotic heart disease of native coronary artery without angina pectoris: Secondary | ICD-10-CM

## 2016-08-16 NOTE — Patient Instructions (Signed)
Your physician recommends that you schedule a follow-up appointment in: Oct. With Dr. Bronson Ing  Your physician recommends that you continue on your current medications as directed. Please refer to the Current Medication list given to you today.  If you need a refill on your cardiac medications before your next appointment, please call your pharmacy.  Thank you for choosing [facility]!

## 2016-08-16 NOTE — Progress Notes (Signed)
Cardiology Office Note   Date:  08/16/2016   ID:  [name], [name] August 12, 1945, MRN [id number]  PCP:  [name], MD  Cardiologist:  Bronson Ing  Chief Complaint  Patient presents with  . Congestive Heart Failure  . Coronary Artery Disease  . Hypertension    History of Present Illness: [name] is a 71 y.o. 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  The patient was diuresed, blood glucose was significantly elevated and was corrected with insulin infusion.  She was sent home on torsemide, continue on metoprolol. She was not found to be a candidate for ACE inhibitor in the setting of renal dysfunction with chronic kidney disease stage IV. Her blood pressure was stable. She was to continue low-sodium diet and daily weights.  She has a feeling much better. She is maintaining her weight, she is changed her eating habits, she is follow with her primary care physician today who is pleased with her progress. Her breathing status has increased significantly after treatment for community-acquired pneumonia and diuresis. She uses nebulizer treatments when necessary.  Past Medical History:  Diagnosis Date  . Acute renal failure superimposed on stage 4 chronic kidney disease ([place]) 07/04/2015  . Allergic rhinitis   . Anemia   . Anxiety   . CAD in native artery    NSTEMI 07/2015 s/p DES to RCA and posterior PDA, PCI 10/2015 with scoring balloon to 85% ISR of distal RCA), known LAD/Cx disease treated medically  . Carotid artery disease ([place])    Mild bilateral carotid disease (1-39% 07/2015)  .  Chronic combined systolic and diastolic CHF (congestive heart failure) ([place])   . CKD (chronic kidney disease), stage IV ([place])   . Constipation   . Diabetes mellitus   . GERD (gastroesophageal reflux disease)   . Heart murmur, systolic   . History of hysterectomy   . Hyperlipidemia   . Hypertension   . Low back pain   . Obesity   . 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 Osteoarthritis   . Overactive bladder   . PVC's (premature ventricular contractions)     Past Surgical History:  Procedure Laterality Date  . ABDOMINAL HYSTERECTOMY    . BACK SURGERY     multiple  . CARDIAC CATHETERIZATION  04/04/2006   Est EF of 60%  . CARDIAC CATHETERIZATION N/A 07/04/2015   Procedure: Left Heart Cath and Coronary Angiography;  Surgeon: Troy Sine, MD;  Location: [facility];  Service: Cardiovascular;  Laterality: N/A;  . CARDIAC CATHETERIZATION N/A 07/04/2015   Procedure: Coronary Stent Intervention;  Surgeon: Troy Sine, MD;  Location: [facility];  Service: Cardiovascular;  Laterality: N/A;  . CARDIAC CATHETERIZATION N/A 10/13/2015   Procedure: Left Heart Cath and Coronary Angiography;  Surgeon: Troy Sine, MD;  Location: [facility];  Service: Cardiovascular;  Laterality: N/A;  . CARDIAC CATHETERIZATION N/A 12/02/2015   Procedure: Left Heart Cath and Coronary Angiography;  Surgeon: Peter M Martinique, MD;  Location: [facility];  Service: Cardiovascular;  Laterality: N/A;  . CARPAL TUNNEL RELEASE    .  CERVICAL BIOPSY     cervical lymph node biopsies  . COLONOSCOPY  May 2002   Dr. Irving Shows :Followup in 5 years, normal exam  . COLONOSCOPY  2008   Dr. Laural Golden: Very redundant colon with mild melanosis coli, splenic flexure polyp biopsy with acute complaint of benign colon polyp. Recommended ten-year followup  . CORONARY STENT PLACEMENT  04/11/2006   2 -- Taxus stents to the  circumflex   . PERIPHERAL VASCULAR CATHETERIZATION Right 07/09/2015   Procedure: Upper Extremity Angiography;  Surgeon: Conrad Mona, MD;  Location: [facility];  Service: Cardiovascular;  Laterality: Right;  . PERIPHERAL VASCULAR CATHETERIZATION Right 07/10/2015   Procedure: RIGHT SUBCLAVIAN ARTERY THROMBECTOMY;  Surgeon: Serafina Mitchell, MD;  Location: [facility];  Service: Vascular;  Laterality: Right;  . tendonitis     bilateral elbow  . TRIGGER FINGER RELEASE       Current Outpatient Prescriptions  Medication Sig Dispense Refill  . acetaminophen (TYLENOL) 500 MG tablet Take 2 tablets (1,000 mg total) by mouth every 6 (six) hours as needed (pain). 30 tablet 0  . albuterol (PROVENTIL HFA;VENTOLIN HFA) 108 (90 Base) MCG/ACT inhaler Inhale 1-2 puffs into the lungs every 6 (six) hours as needed for wheezing or shortness of breath. 1 Inhaler 0  . albuterol (PROVENTIL) (2.5 MG/3ML) 0.083% nebulizer solution Take 3 mLs (2.5 mg total) by nebulization every 6 (six) hours as needed for wheezing or shortness of breath. 75 mL 1  . ALPRAZolam (XANAX) 0.5 MG tablet Take 0.5 mg by mouth daily as needed for anxiety or sleep.     Marland Kitchen aspirin EC 81 MG tablet Take 81 mg by mouth daily.    . calcitRIOL (ROCALTROL) 0.25 MCG capsule Take 0.25 mcg by mouth every Monday, Wednesday, and Friday.     . cetirizine (ZYRTEC) 10 MG tablet Take 10 mg by mouth daily as needed for allergies.    . ferrous sulfate 325 (65 FE) MG tablet Take 325 mg by mouth daily with breakfast.     . guaiFENesin (MUCINEX) 600 MG 12 hr tablet Take 600 mg by mouth 2 (two) times daily.     . hydrALAZINE (APRESOLINE) 25 MG tablet Take 2 tablets (50 mg total) by mouth 3 (three) times daily.    . insulin degludec (TRESIBA) 100 UNIT/ML SOPN FlexTouch Pen Inject 40 Units into the skin at bedtime.     . insulin lispro (HUMALOG KWIKPEN) 100 UNIT/ML KiwkPen Inject 0.15-0.21 mLs (15-21 Units total) into the skin 3 (three) times daily. (Patient taking  differently: Inject 15-21 Units into the skin 3 (three) times daily. Pt uses per sliding scale.) 20 mL 2  . ipratropium (ATROVENT) 0.06 % nasal spray Place 2 sprays into both nostrils 2 (two) times daily as needed for rhinitis.    Marland Kitchen isosorbide mononitrate (IMDUR) 60 MG 24 hr tablet Take 1 tablet (60 mg total) by mouth daily. 30 tablet 0  . latanoprost (XALATAN) 0.005 % ophthalmic solution Place 1 drop into both eyes at bedtime.     . meclizine (ANTIVERT) 25 MG tablet Take 12.5 mg by mouth 2 (two) times daily.     . metoprolol succinate (TOPROL-XL) 50 MG 24 hr tablet TAKE ONE TABLET BY MOUTH TWICE DAILY WITH  OR  IMMEDIATELY  FOLLOWING  A  MEAL 60 tablet 5  . Multiple Vitamin (MULTIVITAMIN WITH MINERALS) TABS tablet Take 1 tablet by mouth daily.    . nitroGLYCERIN (NITROSTAT) 0.4 MG SL tablet Place 1 tablet (0.4  mg total) under the tongue every 5 (five) minutes as needed for chest pain. 25 tablet 2  . pantoprazole (PROTONIX) 40 MG tablet Take 40 mg by mouth daily.     . potassium chloride SA (K-DUR,KLOR-CON) 20 MEQ tablet Take 2 tablets (40 mEq total) by mouth daily. 60 tablet 5  . RANEXA 500 MG 12 hr tablet TAKE ONE TABLET BY MOUTH TWICE DAILY 60 tablet 5  . rosuvastatin (CRESTOR) 40 MG tablet Take 1 tablet (40 mg total) by mouth at bedtime. 30 tablet 0  . ticagrelor (BRILINTA) 90 MG TABS tablet Take 1 tablet (90 mg total) by mouth 2 (two) times daily. 60 tablet 0  . torsemide (DEMADEX) 20 MG tablet Take 60 mg by mouth 2 (two) times daily. Pt is able to take an additional tablet daily for weight gain greater than 3lbs.    . trimethoprim (TRIMPEX) 100 MG tablet Take 100 mg by mouth daily.      No current facility-administered medications for this visit.     Allergies:   Ace inhibitors; Amlodipine; and Codeine    Social History:  The patient  reports that she has never smoked. She has never used smokeless tobacco. She reports that she does not drink alcohol or use drugs.   Family History:  The  patient's family history includes Aneurysm in her brother; Diabetes in her brother and father; Hypertension in her brother and father; Stroke in her father.    ROS: All other systems are reviewed and negative. Unless otherwise mentioned in H&P    PHYSICAL EXAM: VS:  BP 112/60   Pulse 68   Ht 5\' 11"  (1.803 m)   Wt 193 lb (87.5 kg)   SpO2 96%   BMI 26.92 kg/m  , BMI Body mass index is 26.92 kg/m. GEN: Well nourished, well developed, in no acute distress  HEENT: normal  Neck: no JVD, carotid bruits, or masses Cardiac: RRR; no murmurs, rubs, or gallops,no edema  Respiratory:  clear to auscultation bilaterally, normal work of breathing GI: soft, nontender, nondistended, + BS MS: no deformity or atrophy  Skin: warm and dry, no rash Neuro:  Strength and sensation are intact Psych: euthymic mood, full affect  Recent Labs: 12/01/2015: Magnesium 2.5; TSH 1.573 08/07/2016: B Natriuretic Peptide 356.0 08/08/2016: ALT 19; BUN 54; Creatinine, Ser 1.78; Hemoglobin 10.5; Platelets 180; Potassium 3.8; Sodium 138    Lipid Panel    Component Value Date/Time   CHOL 160 07/07/2015 0259   TRIG 119 07/07/2015 0259   HDL 40 (L) 07/07/2015 0259   CHOLHDL 4.0 07/07/2015 0259   VLDL 24 07/07/2015 0259   LDLCALC 96 07/07/2015 0259      Wt Readings from Last 3 Encounters:  08/16/16 193 lb (87.5 kg)  08/08/16 188 lb 4.4 oz (85.4 kg)  07/16/16 190 lb 14.7 oz (86.6 kg)   Other studies Reviewed: Echocardiogram 07/18/16 Left ventricle: LVEF is approximately 45 to 50% with hypokinesis   of the distal anterior wall; severe hypokinesis/akinesis of the   mid/distal lateral wall. Since report of November 2017, no   significant change The cavity size was normal. Wall thickness was   increased in a pattern of mild LVH. Features are consistent with   a pseudonormal left ventricular filling pattern, with concomitant   abnormal relaxation and increased filling pressure (grade 2   diastolic dysfunction).  Doppler parameters are consistent with   high ventricular filling pressure.  Cardiac Catheterization 12/02/2015 Conclusion     Mid LAD lesion,  60 %stenosed.  Prox Cx to Mid Cx lesion, 99 %stenosed.  Mid RCA lesion, 0 %stenosed at site of prior stent  Dist RCA lesion, 30%stenosed at site of prior stent.  Prox RCA lesion, 40 %stenosed.  Ost Cx to Prox Cx lesion, 80 %stenosed.  LV end diastolic pressure is moderately elevated.   1. 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 Patent stents in the mid RCA and distal RCA/PDA 3. Moderate mid LAD disease- unchanged from prior studies. 4. Elevated LVEDP     ASSESSMENT AND PLAN:  1.  Chronic systolic CHF: Doing much better since discharge in the hospital, maintaining weight, avoiding salty foods and medically compliant. Will not make any changes on her regimen at this time as she is doing so well. Refills are provided. No new testing is planned at this time. She is encouraged on current progress and changes in lifestyle.  2. Community-acquired pneumonia: Seen by primary care physician today. We'll continue on nebulizer treatments and inhaler when necessary. Continues to cough some. The patient also says her throat becomes sore after nebulizer treatments. I've asked her to gargle with water and swish the inside of her mouth after each use, spit it out. This should help with any medication coating or steroid induced soreness of her mouth and throat.  3. Coronary artery disease: Most recent cardiac catheterization revealed patent stents in the mid RCA and distal RCA PDA. She did have some moderate LAD disease which was unchanged from prior catheterizations. Continue risk management, beta blocker, aspirin, isosorbide.  4. Hypertension: Blood pressure is well-controlled currently. No changes in regimen at this time.   Current medicines are reviewed at length with the patient today.    Labs/ tests ordered today include:   Phill Myron. West Pugh, ANP, AACC   08/16/2016 4:27 PM    [facility] [address], [place], [place] [address] Phone: [phone]; Fax: [phone]

## 2016-08-22 ENCOUNTER — Encounter: Payer: Self-pay | Admitting: "Endocrinology

## 2016-08-22 ENCOUNTER — Encounter (HOSPITAL_COMMUNITY): Payer: BLUE CROSS/BLUE SHIELD

## 2016-08-22 ENCOUNTER — Ambulatory Visit (INDEPENDENT_AMBULATORY_CARE_PROVIDER_SITE_OTHER): Payer: BLUE CROSS/BLUE SHIELD | Admitting: "Endocrinology

## 2016-08-22 ENCOUNTER — Ambulatory Visit: Payer: BLUE CROSS/BLUE SHIELD | Admitting: Surgery

## 2016-08-22 VITALS — BP 163/67 | HR 71 | Ht 71.0 in | Wt 196.0 lb

## 2016-08-22 DIAGNOSIS — E782 Mixed hyperlipidemia: Secondary | ICD-10-CM | POA: Diagnosis not present

## 2016-08-22 DIAGNOSIS — N184 Chronic kidney disease, stage 4 (severe): Secondary | ICD-10-CM

## 2016-08-22 DIAGNOSIS — I10 Essential (primary) hypertension: Secondary | ICD-10-CM | POA: Diagnosis not present

## 2016-08-22 DIAGNOSIS — Z794 Long term (current) use of insulin: Secondary | ICD-10-CM | POA: Diagnosis not present

## 2016-08-22 DIAGNOSIS — E1122 Type 2 diabetes mellitus with diabetic chronic kidney disease: Secondary | ICD-10-CM

## 2016-08-22 DIAGNOSIS — Z9119 Patient's noncompliance with other medical treatment and regimen: Secondary | ICD-10-CM | POA: Insufficient documentation

## 2016-08-22 DIAGNOSIS — Z91199 Patient's noncompliance with other medical treatment and regimen due to unspecified reason: Secondary | ICD-10-CM | POA: Insufficient documentation

## 2016-08-22 DIAGNOSIS — I2510 Atherosclerotic heart disease of native coronary artery without angina pectoris: Secondary | ICD-10-CM

## 2016-08-22 NOTE — Progress Notes (Signed)
Subjective:    Patient ID: [name], female    DOB: 07/23/1945,    Past Medical History:  Diagnosis Date  . Acute renal failure superimposed on stage 4 chronic kidney disease ([place]) 07/04/2015  . Allergic rhinitis   . Anemia   . Anxiety   . CAD in native artery    NSTEMI 07/2015 s/p DES to RCA and posterior PDA, PCI 10/2015 with scoring balloon to 85% ISR of distal RCA), known LAD/Cx disease treated medically  . Carotid artery disease ([place])    Mild bilateral carotid disease (1-39% 07/2015)  . Chronic combined systolic and diastolic CHF (congestive heart failure) ([place])   . CKD (chronic kidney disease), stage IV ([place])   . Constipation   . Diabetes mellitus   . GERD (gastroesophageal reflux disease)   . Heart murmur, systolic   . History of hysterectomy   . Hyperlipidemia   . Hypertension   . Low back pain   . Obesity   . 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 Osteoarthritis   . Overactive bladder   . PVC's (premature ventricular contractions)    Past Surgical History:  Procedure Laterality Date  . ABDOMINAL HYSTERECTOMY    . BACK SURGERY     multiple  . CARDIAC CATHETERIZATION  04/04/2006   Est EF of 60%  . CARDIAC CATHETERIZATION N/A 07/04/2015   Procedure: Left Heart Cath and Coronary Angiography;  Surgeon: Troy Sine, MD;  Location: [facility];  Service: Cardiovascular;  Laterality: N/A;  . CARDIAC CATHETERIZATION N/A 07/04/2015   Procedure: Coronary Stent Intervention;  Surgeon: Troy Sine, MD;  Location: [facility];  Service: Cardiovascular;  Laterality: N/A;  . CARDIAC CATHETERIZATION N/A 10/13/2015   Procedure: Left Heart Cath and Coronary Angiography;  Surgeon: Troy Sine, MD;  Location: [facility];  Service: Cardiovascular;  Laterality: N/A;  . CARDIAC CATHETERIZATION N/A 12/02/2015   Procedure: Left Heart Cath and Coronary Angiography;   Surgeon: Peter M Martinique, MD;  Location: [facility];  Service: Cardiovascular;  Laterality: N/A;  . CARPAL TUNNEL RELEASE    . CERVICAL BIOPSY     cervical lymph node biopsies  . COLONOSCOPY  May 2002   Dr. Irving Shows :Followup in 5 years, normal exam  . COLONOSCOPY  2008   Dr. Laural Golden: Very redundant colon with mild melanosis coli, splenic flexure polyp biopsy with acute complaint of benign colon polyp. Recommended ten-year followup  . CORONARY STENT PLACEMENT  04/11/2006   2 -- Taxus stents to the circumflex   . PERIPHERAL VASCULAR CATHETERIZATION Right 07/09/2015   Procedure: Upper Extremity Angiography;  Surgeon: Conrad Hughes, MD;  Location: [facility];  Service: Cardiovascular;  Laterality: Right;  . PERIPHERAL VASCULAR CATHETERIZATION Right 07/10/2015   Procedure: RIGHT SUBCLAVIAN ARTERY THROMBECTOMY;  Surgeon: Serafina Mitchell, MD;  Location: [facility];  Service: Vascular;  Laterality: Right;  . tendonitis     bilateral elbow  . TRIGGER FINGER RELEASE     Social History   Social History  . Marital status: Married    Spouse name: N/A  . Number of children: 3  . Years of education: N/A   Social History Main Topics  . Smoking status: Never Smoker  . Smokeless tobacco: Never Used  . Alcohol use No  . Drug use: No  . Sexual activity: Not Asked   Other  Topics Concern  . None   Social History Narrative  . None   Outpatient Encounter Prescriptions as of 08/22/2016  Medication Sig  . acetaminophen (TYLENOL) 500 MG tablet Take 2 tablets (1,000 mg total) by mouth every 6 (six) hours as needed (pain).  Marland Kitchen albuterol (PROVENTIL HFA;VENTOLIN HFA) 108 (90 Base) MCG/ACT inhaler Inhale 1-2 puffs into the lungs every 6 (six) hours as needed for wheezing or shortness of breath.  Marland Kitchen albuterol (PROVENTIL) (2.5 MG/3ML) 0.083% nebulizer solution Take 3 mLs (2.5 mg total) by nebulization every 6 (six) hours as needed for wheezing or shortness of breath.  . ALPRAZolam (XANAX) 0.5 MG  tablet Take 0.5 mg by mouth daily as needed for anxiety or sleep.   Marland Kitchen aspirin EC 81 MG tablet Take 81 mg by mouth daily.  . calcitRIOL (ROCALTROL) 0.25 MCG capsule Take 0.25 mcg by mouth every Monday, Wednesday, and Friday.   . cetirizine (ZYRTEC) 10 MG tablet Take 10 mg by mouth daily as needed for allergies.  . ferrous sulfate 325 (65 FE) MG tablet Take 325 mg by mouth daily with breakfast.   . guaiFENesin (MUCINEX) 600 MG 12 hr tablet Take 600 mg by mouth 2 (two) times daily.   . hydrALAZINE (APRESOLINE) 25 MG tablet Take 2 tablets (50 mg total) by mouth 3 (three) times daily.  . insulin degludec (TRESIBA) 100 UNIT/ML SOPN FlexTouch Pen Inject 40 Units into the skin at bedtime.   . insulin lispro (HUMALOG KWIKPEN) 100 UNIT/ML KiwkPen Inject 0.15-0.21 mLs (15-21 Units total) into the skin 3 (three) times daily. (Patient taking differently: Inject 15-21 Units into the skin 3 (three) times daily. Pt uses per sliding scale.)  . ipratropium (ATROVENT) 0.06 % nasal spray Place 2 sprays into both nostrils 2 (two) times daily as needed for rhinitis.  Marland Kitchen isosorbide mononitrate (IMDUR) 60 MG 24 hr tablet Take 1 tablet (60 mg total) by mouth daily.  Marland Kitchen latanoprost (XALATAN) 0.005 % ophthalmic solution Place 1 drop into both eyes at bedtime.   . meclizine (ANTIVERT) 25 MG tablet Take 12.5 mg by mouth 2 (two) times daily.   . metoprolol succinate (TOPROL-XL) 50 MG 24 hr tablet TAKE ONE TABLET BY MOUTH TWICE DAILY WITH  OR  IMMEDIATELY  FOLLOWING  A  MEAL  . Multiple Vitamin (MULTIVITAMIN WITH MINERALS) TABS tablet Take 1 tablet by mouth daily.  . nitroGLYCERIN (NITROSTAT) 0.4 MG SL tablet Place 1 tablet (0.4 mg total) under the tongue every 5 (five) minutes as needed for chest pain.  . pantoprazole (PROTONIX) 40 MG tablet Take 40 mg by mouth daily.   . potassium chloride SA (K-DUR,KLOR-CON) 20 MEQ tablet Take 2 tablets (40 mEq total) by mouth daily.  Marland Kitchen RANEXA 500 MG 12 hr tablet TAKE ONE TABLET BY MOUTH TWICE  DAILY  . rosuvastatin (CRESTOR) 40 MG tablet Take 1 tablet (40 mg total) by mouth at bedtime.  . ticagrelor (BRILINTA) 90 MG TABS tablet Take 1 tablet (90 mg total) by mouth 2 (two) times daily.  Marland Kitchen torsemide (DEMADEX) 20 MG tablet Take 60 mg by mouth 2 (two) times daily. Pt is able to take an additional tablet daily for weight gain greater than 3lbs.  . trimethoprim (TRIMPEX) 100 MG tablet Take 100 mg by mouth daily.    No facility-administered encounter medications on file as of 08/22/2016.    ALLERGIES: Allergies  Allergen Reactions  . Ace Inhibitors Cough  . Amlodipine     Edema   . Codeine Rash  VACCINATION STATUS:  There is no immunization history on file for this patient.  Diabetes  She presents for her follow-up diabetic visit. She has type 2 diabetes mellitus. Her disease course has been worsening. Pertinent negatives for hypoglycemia include no confusion, headaches, pallor or seizures. Associated symptoms include polydipsia and polyuria. Pertinent negatives for diabetes include no chest pain, no fatigue and no polyphagia. There are no hypoglycemic complications. Symptoms are worsening. Diabetic complications include heart disease, nephropathy and retinopathy. Pertinent negatives for diabetic complications include no CVA. Risk factors for coronary artery disease include diabetes mellitus, dyslipidemia, sedentary lifestyle and hypertension. Current diabetic treatment includes insulin injections. She is compliant with treatment most of the time. Her weight is increasing steadily. She is following a generally unhealthy diet. Meal planning includes avoidance of concentrated sweets. She rarely participates in exercise. Her home blood glucose trend is fluctuating dramatically. Her overall blood glucose range is >200 mg/dl. (She comes with the 5 days of log showing average blood glucose above 200, did not follow the monitoring and insulin routine she was given last visit.) An ACE  inhibitor/angiotensin II receptor blocker is contraindicated (allergic to ACEI). Eye exam is current.  Hypertension  This is a chronic problem. The current episode started more than 1 year ago. The problem has been gradually improving since onset. Pertinent negatives include no chest pain, headaches, palpitations or shortness of breath. Hypertensive end-organ damage includes retinopathy. There is no history of CVA.  Hyperlipidemia  This is a chronic problem. The current episode started more than 1 year ago. Pertinent negatives include no chest pain, myalgias or shortness of breath. Current antihyperlipidemic treatment includes statins.  Coronary Artery Disease  Pertinent negatives include no chest pain, leg swelling, palpitations or shortness of breath. Risk factors include hyperlipidemia and hypertension.     Review of Systems  Constitutional: Negative for fatigue and unexpected weight change.  HENT: Negative for trouble swallowing and voice change.   Eyes: Negative for visual disturbance.  Respiratory: Negative for cough, shortness of breath and wheezing.   Cardiovascular: Negative for chest pain, palpitations and leg swelling.  Gastrointestinal: Negative for diarrhea, nausea and vomiting.  Endocrine: Positive for polydipsia and polyuria. Negative for cold intolerance, heat intolerance and polyphagia.  Musculoskeletal: Negative for arthralgias and myalgias.  Skin: Negative for color change, pallor, rash and wound.  Neurological: Negative for seizures and headaches.  Psychiatric/Behavioral: Negative for confusion and suicidal ideas.    Objective:    BP (!) 163/67   Pulse 71   Ht 5\' 11"  (1.803 m)   Wt 196 lb (88.9 kg)   BMI 27.34 kg/m   Wt Readings from Last 3 Encounters:  08/22/16 196 lb (88.9 kg)  08/16/16 193 lb (87.5 kg)  08/08/16 188 lb 4.4 oz (85.4 kg)    Physical Exam  Constitutional: She is oriented to person, place, and time. She appears well-developed.  HENT:  Head:  Normocephalic and atraumatic.  Eyes: EOM are normal.  Neck: Normal range of motion. Neck supple. No tracheal deviation present. No thyromegaly present.  Cardiovascular: Normal rate and regular rhythm.   Pulmonary/Chest: Effort normal and breath sounds normal.  Abdominal: Soft. Bowel sounds are normal. There is no tenderness. There is no guarding.  Musculoskeletal: Normal range of motion. She exhibits no edema.  Neurological: She is alert and oriented to person, place, and time. She has normal reflexes. No cranial nerve deficit. Coordination normal.  Skin: Skin is warm and dry. No rash noted. No erythema. No pallor.  Psychiatric:  She has a normal mood and affect. Judgment normal.    Results for orders placed or performed during the hospital encounter of 08/07/16  CBC with Differential  Result Value Ref Range   WBC 4.5 4.0 - 10.5 K/uL   RBC 3.34 (L) 3.87 - 5.11 MIL/uL   Hemoglobin 10.5 (L) 12.0 - 15.0 g/dL   HCT 33.8 (L) 36.0 - 46.0 %   MCV 101.2 (H) 78.0 - 100.0 fL   MCH 31.4 26.0 - 34.0 pg   MCHC 31.1 30.0 - 36.0 g/dL   RDW 15.6 (H) 11.5 - 15.5 %   Platelets 180 150 - 400 K/uL   Neutrophils Relative % 81 %   Neutro Abs 3.7 1.7 - 7.7 K/uL   Lymphocytes Relative 12 %   Lymphs Abs 0.6 (L) 0.7 - 4.0 K/uL   Monocytes Relative 6 %   Monocytes Absolute 0.3 0.1 - 1.0 K/uL   Eosinophils Relative 1 %   Eosinophils Absolute 0.0 0.0 - 0.7 K/uL   Basophils Relative 0 %   Basophils Absolute 0.0 0.0 - 0.1 K/uL  Basic metabolic panel  Result Value Ref Range   Sodium 136 135 - 145 mmol/L   Potassium 4.3 3.5 - 5.1 mmol/L   Chloride 90 (L) 101 - 111 mmol/L   CO2 31 22 - 32 mmol/L   Glucose, Bld 661 (HH) 65 - 99 mg/dL   BUN 71 (H) 6 - 20 mg/dL   Creatinine, Ser 2.38 (H) 0.44 - 1.00 mg/dL   Calcium 10.5 (H) 8.9 - 10.3 mg/dL   GFR calc non Af Amer 19 (L) >60 mL/min   GFR calc Af Amer 22 (L) >60 mL/min   Anion gap 15 5 - 15  Troponin I  Result Value Ref Range   Troponin I 0.05 (HH) <0.03  ng/mL  Brain natriuretic peptide  Result Value Ref Range   B Natriuretic Peptide 356.0 (H) 0.0 - 100.0 pg/mL  Urinalysis, Routine w reflex microscopic  Result Value Ref Range   Color, Urine STRAW (A) YELLOW   APPearance CLEAR CLEAR   Specific Gravity, Urine 1.009 1.005 - 1.030   pH 6.0 5.0 - 8.0   Glucose, UA >=500 (A) NEGATIVE mg/dL   Hgb urine dipstick SMALL (A) NEGATIVE   Bilirubin Urine NEGATIVE NEGATIVE   Ketones, ur 5 (A) NEGATIVE mg/dL   Protein, ur NEGATIVE NEGATIVE mg/dL   Nitrite NEGATIVE NEGATIVE   Leukocytes, UA NEGATIVE NEGATIVE   RBC / HPF 0-5 0 - 5 RBC/hpf   WBC, UA 0-5 0 - 5 WBC/hpf   Bacteria, UA NONE SEEN NONE SEEN   Squamous Epithelial / LPF 0-5 (A) NONE SEEN  Basic metabolic panel  Result Value Ref Range   Sodium 134 (L) 135 - 145 mmol/L   Potassium 3.4 (L) 3.5 - 5.1 mmol/L   Chloride 91 (L) 101 - 111 mmol/L   CO2 29 22 - 32 mmol/L   Glucose, Bld 482 (H) 65 - 99 mg/dL   BUN 68 (H) 6 - 20 mg/dL   Creatinine, Ser 2.34 (H) 0.44 - 1.00 mg/dL   Calcium 10.1 8.9 - 10.3 mg/dL   GFR calc non Af Amer 20 (L) >60 mL/min   GFR calc Af Amer 23 (L) >60 mL/min   Anion gap 14 5 - 15  Troponin I (q 6hr x 3)  Result Value Ref Range   Troponin I 0.49 (HH) <0.03 ng/mL  Troponin I (q 6hr x 3)  Result Value Ref Range  Troponin I 1.75 (HH) <0.03 ng/mL  Troponin I (q 6hr x 3)  Result Value Ref Range   Troponin I 1.13 (HH) <0.03 ng/mL  Glucose, capillary  Result Value Ref Range   Glucose-Capillary 388 (H) 65 - 99 mg/dL  Glucose, capillary  Result Value Ref Range   Glucose-Capillary 191 (H) 65 - 99 mg/dL  Basic metabolic panel  Result Value Ref Range   Sodium 143 135 - 145 mmol/L   Potassium 3.4 (L) 3.5 - 5.1 mmol/L   Chloride 96 (L) 101 - 111 mmol/L   CO2 33 (H) 22 - 32 mmol/L   Glucose, Bld 127 (H) 65 - 99 mg/dL   BUN 63 (H) 6 - 20 mg/dL   Creatinine, Ser 2.12 (H) 0.44 - 1.00 mg/dL   Calcium 10.5 (H) 8.9 - 10.3 mg/dL   GFR calc non Af Amer 22 (L) >60 mL/min    GFR calc Af Amer 26 (L) >60 mL/min   Anion gap 14 5 - 15  CBC  Result Value Ref Range   WBC 4.5 4.0 - 10.5 K/uL   RBC 3.28 (L) 3.87 - 5.11 MIL/uL   Hemoglobin 10.5 (L) 12.0 - 15.0 g/dL   HCT 32.6 (L) 36.0 - 46.0 %   MCV 99.4 78.0 - 100.0 fL   MCH 32.0 26.0 - 34.0 pg   MCHC 32.2 30.0 - 36.0 g/dL   RDW 15.1 11.5 - 15.5 %   Platelets 184 150 - 400 K/uL  Creatinine, serum  Result Value Ref Range   Creatinine, Ser 2.00 (H) 0.44 - 1.00 mg/dL   GFR calc non Af Amer 24 (L) >60 mL/min   GFR calc Af Amer 28 (L) >60 mL/min  Glucose, capillary  Result Value Ref Range   Glucose-Capillary 172 (H) 65 - 99 mg/dL   Comment 1 Notify RN   CBC  Result Value Ref Range   WBC 3.9 (L) 4.0 - 10.5 K/uL   RBC 3.27 (L) 3.87 - 5.11 MIL/uL   Hemoglobin 10.5 (L) 12.0 - 15.0 g/dL   HCT 32.4 (L) 36.0 - 46.0 %   MCV 99.1 78.0 - 100.0 fL   MCH 32.1 26.0 - 34.0 pg   MCHC 32.4 30.0 - 36.0 g/dL   RDW 15.5 11.5 - 15.5 %   Platelets 180 150 - 400 K/uL  Comprehensive metabolic panel  Result Value Ref Range   Sodium 138 135 - 145 mmol/L   Potassium 3.8 3.5 - 5.1 mmol/L   Chloride 98 (L) 101 - 111 mmol/L   CO2 30 22 - 32 mmol/L   Glucose, Bld 104 (H) 65 - 99 mg/dL   BUN 54 (H) 6 - 20 mg/dL   Creatinine, Ser 1.78 (H) 0.44 - 1.00 mg/dL   Calcium 9.8 8.9 - 10.3 mg/dL   Total Protein 7.0 6.5 - 8.1 g/dL   Albumin 3.6 3.5 - 5.0 g/dL   AST 25 15 - 41 U/L   ALT 19 14 - 54 U/L   Alkaline Phosphatase 69 38 - 126 U/L   Total Bilirubin 0.6 0.3 - 1.2 mg/dL   GFR calc non Af Amer 28 (L) >60 mL/min   GFR calc Af Amer 32 (L) >60 mL/min   Anion gap 10 5 - 15  Glucose, capillary  Result Value Ref Range   Glucose-Capillary 320 (H) 65 - 99 mg/dL  Glucose, capillary  Result Value Ref Range   Glucose-Capillary 99 65 - 99 mg/dL  Glucose, capillary  Result Value Ref Range  Glucose-Capillary 313 (H) 65 - 99 mg/dL  Glucose, capillary  Result Value Ref Range   Glucose-Capillary 244 (H) 65 - 99 mg/dL  Glucose, capillary   Result Value Ref Range   Glucose-Capillary 129 (H) 65 - 99 mg/dL  Glucose, capillary  Result Value Ref Range   Glucose-Capillary 66 65 - 99 mg/dL  Glucose, capillary  Result Value Ref Range   Glucose-Capillary 77 65 - 99 mg/dL  Glucose, capillary  Result Value Ref Range   Glucose-Capillary 158 (H) 65 - 99 mg/dL  Glucose, capillary  Result Value Ref Range   Glucose-Capillary 332 (H) 65 - 99 mg/dL  Glucose, capillary  Result Value Ref Range   Glucose-Capillary 298 (H) 65 - 99 mg/dL  POC CBG, ED  Result Value Ref Range   Glucose-Capillary 537 (HH) 65 - 99 mg/dL  CBG monitoring, ED  Result Value Ref Range   Glucose-Capillary 437 (H) 65 - 99 mg/dL   Comment 1 Notify RN    Comment 2 Document in Chart    Chemistry (most recent): Lab Results  Component Value Date   NA 138 08/08/2016   K 3.8 08/08/2016   CL 98 (L) 08/08/2016   CO2 30 08/08/2016   BUN 54 (H) 08/08/2016   CREATININE 1.78 (H) 08/08/2016   Diabetic Labs (most recent): Lab Results  Component Value Date   HGBA1C 10.3 (H) 07/09/2016   HGBA1C 9.7 (H) 02/18/2016   HGBA1C 8.7 (H) 09/25/2015     Assessment & Plan:   1. Type 2 diabetes mellitus with stage 4 chronic kidney disease and CAD  -She came with Incomplete blood glucose profile, after long absence from January 2018, her recent  A1c was increasing to 10.3 % from 8.7%, after generally improving from 12.3%. -Patient is advised to stick to a routine mealtimes to eat 3 meals  a day and avoid unnecessary snacks ( to snack only to correct hypoglycemia). Patient is advised to eliminate simple carbs  from her diet including cakes desserts ice cream soda (  diet and regular) , sweet tea , Candies,  chips and cookies, artificial sweeteners,   and "sugar-free" products .  This will help patient to have stable blood glucose profile and potentially lose weight. Patient is given detailed personalized glucose monitoring and insulin dosing instructions. Patient is instructed to  call back with extremes of blood glucose less than 70 or greater than 300. Patient to bring meter and  blood glucose logs during their next visit.  -  She recently had ACS, Nephrolithiasis. - I urged her to resume strict basal/bolus insulin associated with strict monitoring of blood glucose 4 times a day-before meals and at bedtime.  - I advised her to restart  Tresiba 40  units qhs,   Humalog  15 units 3 times a day before meals plus correction for pre-meal blood glucose above 90 mg/dL.  - She is advised to continue strict monitoring of blood glucose before meals and at bedtime. -Patient is warned not to take insulin without proper monitoring. -She is encouraged to call clinic if she registers blood glucose below 70 or above 300 mg/dL 3 tests in- a- row.  -She does not have a safe oral treatment option for diabetes given advanced chronic kidney disease.  -Patient specific target for A1c, LDL, and triglycerides were discussed with patient.    2. Essential hypertension Pt is allergic to ACEI, controleld. Advised to continue the rest of her medications.  3. Hyperlipemia -continue Crestor.   Follow up  plan: Return in about 3 weeks (around 09/12/2016) for meter, and logs.  Glade Lloyd, MD Phone: [phone]  Fax: [phone]   08/22/2016, 11:07 AM

## 2016-08-22 NOTE — Patient Instructions (Signed)
Advice for weight management -For most of us the best way to lose weight is by diet management. Generally speaking, diet management means restricting carbohydrate consumption to minimum possible (and to unprocessed or minimally processed complex starch) and increasing protein intake (animal or plant source), fruits, and vegetables.  -Sticking to a routine mealtime to eat 3 meals a day and avoiding unnecessary snacks is shown to have a big role in weight control.  -It is better to avoid simple carbohydrates including: Cakes, Desserts, Ice Cream, Soda (diet and regular), Sweet Tea, Candies, Chips, Cookies, Artificial Sweeteners, and "Sugar-free" Products.   -Exercise: 30 minutes a day 3-4 days a week, or 150 minutes a week. Combine stretch, strength, and aerobic activities. You may seek evaluation by your heart doctor prior to initiating exercise if you have high risk for heart disease.  -If you are interested, we can schedule a visit with [name], RDN, CDE for individualized nutrition education.  

## 2016-08-23 ENCOUNTER — Ambulatory Visit: Payer: BLUE CROSS/BLUE SHIELD | Admitting: "Endocrinology

## 2016-08-23 ENCOUNTER — Other Ambulatory Visit: Payer: Self-pay | Admitting: *Deleted

## 2016-08-23 NOTE — Patient Outreach (Signed)
[facility] Care Management  08/23/2016  [name] [birth year] [id number]   Care coordination [facility] CM went to call Pt to inform of of CM arrival to MD office but noted in EPIC Appt and labs with Nida on 08/22/16 vs 08/23/16 as pt had mentioned Left voice message at home number to include CM return mobile number  Plan [facility] Cm will attempt to reach to again to follow up  [place]. Lavina Hamman, RN, BSN, [place] Care Management [phone]

## 2016-08-23 NOTE — Patient Outreach (Signed)
[facility]) Care Management  08/23/2016  CORINTHIA HELMERS [birth year] [id number]   Care Coordination [facility] CM spoke with Dr Berdine Addison staff Pt has a August 30 2016 1130 appt for follow up  Plan [facility] CM will continue try to reach Mrs Rugg for follow on transition of care and review of pcp appt on August 30 2016 at 1130 (visit)  Joelene Millin L. Lavina Hamman, RN, BSN, [place] Care Management [phone]

## 2016-08-29 ENCOUNTER — Ambulatory Visit (INDEPENDENT_AMBULATORY_CARE_PROVIDER_SITE_OTHER): Payer: BLUE CROSS/BLUE SHIELD | Admitting: Otolaryngology

## 2016-08-29 DIAGNOSIS — J310 Chronic rhinitis: Secondary | ICD-10-CM | POA: Diagnosis not present

## 2016-08-29 DIAGNOSIS — J330 Polyp of nasal cavity: Secondary | ICD-10-CM

## 2016-08-30 ENCOUNTER — Other Ambulatory Visit: Payer: Self-pay | Admitting: *Deleted

## 2016-08-31 ENCOUNTER — Ambulatory Visit: Payer: BLUE CROSS/BLUE SHIELD | Admitting: Urology

## 2016-09-07 ENCOUNTER — Other Ambulatory Visit: Payer: Self-pay | Admitting: "Endocrinology

## 2016-09-12 ENCOUNTER — Ambulatory Visit: Payer: BLUE CROSS/BLUE SHIELD | Admitting: "Endocrinology

## 2016-09-14 ENCOUNTER — Encounter: Payer: Self-pay | Admitting: Surgery

## 2016-09-16 ENCOUNTER — Other Ambulatory Visit: Payer: Self-pay | Admitting: "Endocrinology

## 2016-09-19 ENCOUNTER — Ambulatory Visit (INDEPENDENT_AMBULATORY_CARE_PROVIDER_SITE_OTHER): Payer: BLUE CROSS/BLUE SHIELD | Admitting: Cardiovascular Disease

## 2016-09-19 ENCOUNTER — Encounter: Payer: Self-pay | Admitting: Cardiovascular Disease

## 2016-09-19 VITALS — BP 138/74 | HR 61 | Ht 70.0 in | Wt 200.8 lb

## 2016-09-19 DIAGNOSIS — I779 Disorder of arteries and arterioles, unspecified: Secondary | ICD-10-CM

## 2016-09-19 DIAGNOSIS — E785 Hyperlipidemia, unspecified: Secondary | ICD-10-CM | POA: Diagnosis not present

## 2016-09-19 DIAGNOSIS — I252 Old myocardial infarction: Secondary | ICD-10-CM | POA: Diagnosis not present

## 2016-09-19 DIAGNOSIS — Z955 Presence of coronary angioplasty implant and graft: Secondary | ICD-10-CM

## 2016-09-19 DIAGNOSIS — I10 Essential (primary) hypertension: Secondary | ICD-10-CM | POA: Diagnosis not present

## 2016-09-19 DIAGNOSIS — I2510 Atherosclerotic heart disease of native coronary artery without angina pectoris: Secondary | ICD-10-CM

## 2016-09-19 DIAGNOSIS — I739 Peripheral vascular disease, unspecified: Secondary | ICD-10-CM

## 2016-09-19 DIAGNOSIS — Z9289 Personal history of other medical treatment: Secondary | ICD-10-CM

## 2016-09-19 DIAGNOSIS — I25118 Atherosclerotic heart disease of native coronary artery with other forms of angina pectoris: Secondary | ICD-10-CM | POA: Diagnosis not present

## 2016-09-19 DIAGNOSIS — I5042 Chronic combined systolic (congestive) and diastolic (congestive) heart failure: Secondary | ICD-10-CM

## 2016-09-19 DIAGNOSIS — E118 Type 2 diabetes mellitus with unspecified complications: Secondary | ICD-10-CM | POA: Diagnosis not present

## 2016-09-19 NOTE — Progress Notes (Signed)
SUBJECTIVE: The patient presents for follow-up of coronary artery disease. She was hospitalized in June 2018 for mild acute on chronic combined systolic and diastolic heart failure and non-STEMI with labile uncontrolled diabetes mellitus with mild diabetic ketoacidosis. She has chronic kidney disease stage 4.  Left Heart Cath and Coronary Angiography 12/02/15  Mid LAD lesion, 60 %stenosed.  Prox Cx to Mid Cx lesion, 99 %stenosed.  Mid RCA lesion, 0 %stenosed at site of prior stent  Dist RCA lesion, 30%stenosed at site of prior stent.  Prox RCA lesion, 40 %stenosed.  Ost Cx to Prox Cx lesion, 80 %stenosed.  LV end diastolic pressure is moderately elevated.  1. 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 Patent stents in the mid RCA and distal RCA/PDA 3. Moderate mid LAD disease- unchanged from prior studies. 4. 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  Her ischemic symptoms are back pain and SOB.  Echocardiogram 07/11/16: Mildly reduced left ventricular systolic function, LVEF 17-40% with wall motion abnormalities, mild LVH, grade 2 diastolic dysfunction with elevated filling pressures.  Hemoglobin A1c 10.3% on 07/09/16.  She currently denies chest pain. She has some occasional shortness of breath alleviated with using her inhaler and nebulizers.  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    Review of Systems: As per "subjective", otherwise negative.  Allergies  Allergen Reactions  . Ace Inhibitors Cough  . Amlodipine     Edema   . Codeine Rash    Current Outpatient Prescriptions  Medication Sig Dispense Refill  . acetaminophen (TYLENOL) 500 MG tablet Take 2 tablets (1,000 mg total) by mouth every 6 (six) hours as needed (pain). 30 tablet 0  . albuterol  (PROVENTIL HFA;VENTOLIN HFA) 108 (90 Base) MCG/ACT inhaler Inhale 1-2 puffs into the lungs every 6 (six) hours as needed for wheezing or shortness of breath. 1 Inhaler 0  . albuterol (PROVENTIL) (2.5 MG/3ML) 0.083% nebulizer solution Take 3 mLs (2.5 mg total) by nebulization every 6 (six) hours as needed for wheezing or shortness of breath. 75 mL 1  . ALPRAZolam (XANAX) 0.5 MG tablet Take 0.5 mg by mouth daily as needed for anxiety or sleep.     Marland Kitchen aspirin EC 81 MG tablet Take 81 mg by mouth daily.    . calcitRIOL (ROCALTROL) 0.25 MCG capsule Take 0.25 mcg by mouth every Monday, Wednesday, and Friday.     . cetirizine (ZYRTEC) 10 MG tablet Take 10 mg by mouth daily as needed for allergies.    . ferrous sulfate 325 (65 FE) MG tablet Take 325 mg by mouth daily with breakfast.     . guaiFENesin (MUCINEX) 600 MG 12 hr tablet Take 600 mg by mouth 2 (two) times daily.     Marland Kitchen HUMALOG KWIKPEN 100 UNIT/ML KiwkPen INJECT 15 TO 21 UNITS TOTAL INTO THE SKIN THREE TIMES DAILY 15 mL 2  . hydrALAZINE (APRESOLINE) 25 MG tablet Take 2 tablets (50 mg total) by mouth 3 (three) times daily.    Marland Kitchen HYDROcodone-homatropine (HYCODAN) 5-1.5 MG/5ML syrup Take 5 mLs by mouth every 6 (six) hours as needed.  0  . insulin degludec (TRESIBA) 100 UNIT/ML SOPN FlexTouch Pen Inject 40 Units into the skin at bedtime.     Marland Kitchen ipratropium (ATROVENT) 0.06 % nasal spray Place 2 sprays  into both nostrils 2 (two) times daily as needed for rhinitis.    Marland Kitchen isosorbide mononitrate (IMDUR) 60 MG 24 hr tablet Take 1 tablet (60 mg total) by mouth daily. 30 tablet 0  . latanoprost (XALATAN) 0.005 % ophthalmic solution Place 1 drop into both eyes at bedtime.     . meclizine (ANTIVERT) 25 MG tablet Take 12.5 mg by mouth 2 (two) times daily.     . metoprolol succinate (TOPROL-XL) 50 MG 24 hr tablet TAKE ONE TABLET BY MOUTH TWICE DAILY WITH  OR  IMMEDIATELY  FOLLOWING  A  MEAL 60 tablet 5  . Multiple Vitamin (MULTIVITAMIN WITH MINERALS) TABS tablet Take 1  tablet by mouth daily.    . nitroGLYCERIN (NITROSTAT) 0.4 MG SL tablet Place 1 tablet (0.4 mg total) under the tongue every 5 (five) minutes as needed for chest pain. 25 tablet 2  . pantoprazole (PROTONIX) 40 MG tablet Take 40 mg by mouth daily.     . potassium chloride SA (K-DUR,KLOR-CON) 20 MEQ tablet Take 2 tablets (40 mEq total) by mouth daily. 60 tablet 5  . RANEXA 500 MG 12 hr tablet TAKE ONE TABLET BY MOUTH TWICE DAILY 60 tablet 5  . rosuvastatin (CRESTOR) 40 MG tablet Take 1 tablet (40 mg total) by mouth at bedtime. 30 tablet 0  . ticagrelor (BRILINTA) 90 MG TABS tablet Take 1 tablet (90 mg total) by mouth 2 (two) times daily. 60 tablet 0  . torsemide (DEMADEX) 20 MG tablet Take 60 mg by mouth 2 (two) times daily. Pt is able to take an additional tablet daily for weight gain greater than 3lbs.    . trimethoprim (TRIMPEX) 100 MG tablet Take 100 mg by mouth daily.      No current facility-administered medications for this visit.     Past Medical History:  Diagnosis Date  . Acute renal failure superimposed on stage 4 chronic kidney disease ([place]) 07/04/2015  . Allergic rhinitis   . Anemia   . Anxiety   . CAD in native artery    NSTEMI 07/2015 s/p DES to RCA and posterior PDA, PCI 10/2015 with scoring balloon to 85% ISR of distal RCA), known LAD/Cx disease treated medically  . Carotid artery disease ([place])    Mild bilateral carotid disease (1-39% 07/2015)  . Chronic combined systolic and diastolic CHF (congestive heart failure) ([place])   . CKD (chronic kidney disease), stage IV ([place])   . Constipation   . Diabetes mellitus   . GERD (gastroesophageal reflux disease)   . Heart murmur, systolic   . History of hysterectomy   . Hyperlipidemia   . Hypertension   . Low back pain   . Obesity   . 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 Osteoarthritis   . Overactive bladder   . PVC's (premature ventricular  contractions)     Past Surgical History:  Procedure Laterality Date  . ABDOMINAL HYSTERECTOMY    . BACK SURGERY     multiple  . CARDIAC CATHETERIZATION  04/04/2006   Est EF of 60%  . CARDIAC CATHETERIZATION N/A 07/04/2015   Procedure: Left Heart Cath and Coronary Angiography;  Surgeon: Troy Sine, MD;  Location: [facility];  Service: Cardiovascular;  Laterality: N/A;  . CARDIAC CATHETERIZATION N/A 07/04/2015   Procedure: Coronary Stent Intervention;  Surgeon: Troy Sine, MD;  Location: [facility];  Service: Cardiovascular;  Laterality: N/A;  .  CARDIAC CATHETERIZATION N/A 10/13/2015   Procedure: Left Heart Cath and Coronary Angiography;  Surgeon: Troy Sine, MD;  Location: [facility];  Service: Cardiovascular;  Laterality: N/A;  . CARDIAC CATHETERIZATION N/A 12/02/2015   Procedure: Left Heart Cath and Coronary Angiography;  Surgeon: Peter M Martinique, MD;  Location: [facility];  Service: Cardiovascular;  Laterality: N/A;  . CARPAL TUNNEL RELEASE    . CERVICAL BIOPSY     cervical lymph node biopsies  . COLONOSCOPY  May 2002   Dr. Irving Shows :Followup in 5 years, normal exam  . COLONOSCOPY  2008   Dr. Laural Golden: Very redundant colon with mild melanosis coli, splenic flexure polyp biopsy with acute complaint of benign colon polyp. Recommended ten-year followup  . CORONARY STENT PLACEMENT  04/11/2006   2 -- Taxus stents to the circumflex   . PERIPHERAL VASCULAR CATHETERIZATION Right 07/09/2015   Procedure: Upper Extremity Angiography;  Surgeon: Conrad Zanesfield, MD;  Location: [facility];  Service: Cardiovascular;  Laterality: Right;  . PERIPHERAL VASCULAR CATHETERIZATION Right 07/10/2015   Procedure: RIGHT SUBCLAVIAN ARTERY THROMBECTOMY;  Surgeon: Serafina Mitchell, MD;  Location: [facility];  Service: Vascular;  Laterality: Right;  . tendonitis     bilateral elbow  . TRIGGER FINGER RELEASE      Social History   Social History  . Marital status: Married      Spouse name: N/A  . Number of children: 3  . Years of education: N/A   Occupational History  . Not on file.   Social History Main Topics  . Smoking status: Never Smoker  . Smokeless tobacco: Never Used  . Alcohol use No  . Drug use: No  . Sexual activity: Not on file   Other Topics Concern  . Not on file   Social History Narrative  . No narrative on file     Vitals:   09/19/16 1109  BP: 138/74  Pulse: 61  Weight: 200 lb 12.8 oz (91.1 kg)  Height: 5\' 10"  (1.778 m)    Wt Readings from Last 3 Encounters:  09/19/16 200 lb 12.8 oz (91.1 kg)  08/22/16 196 lb (88.9 kg)  08/16/16 193 lb (87.5 kg)     PHYSICAL EXAM General: NAD HEENT: Normal. Neck: No JVD, no thyromegaly. Lungs: Clear to auscultation bilaterally with normal respiratory effort. CV: Nondisplaced PMI.  Regular rate and rhythm, normal S1/S2, no S3/S4, no murmur. No pretibial or periankle edema.   Abdomen: Soft, nontender, no distention.  Neurologic: Alert and oriented.  Psych: Normal affect. Skin: Normal. Musculoskeletal: No gross deformities.    ECG: Most recent ECG reviewed.   Labs: Lab Results  Component Value Date/Time   K 3.8 08/08/2016 04:22 AM   BUN 54 (H) 08/08/2016 04:22 AM   BUN 53 (H) 02/18/2016 09:37 AM   CREATININE 1.78 (H) 08/08/2016 04:22 AM   CREATININE 1.73 (H) 10/08/2013 10:24 AM   ALT 19 08/08/2016 04:22 AM   TSH 1.573 12/01/2015 01:56 AM   HGB 10.5 (L) 08/08/2016 04:22 AM     Lipids: Lab Results  Component Value Date/Time   LDLCALC 96 07/07/2015 02:59 AM   CHOL 160 07/07/2015 02:59 AM   TRIG 119 07/07/2015 02:59 AM   HDL 40 (L) 07/07/2015 02:59 AM       ASSESSMENT AND PLAN:  1. CAD s/p NSTEMI with PDA and RCA stenting and CTO of left circumflex with RCA in-stent restenosis s/p PCI 10/2015: Symptomatically stable. Given her advanced  chronic kidney disease, I am not inclined to do a coronary angiography unless absolutely necessary. I do not feel she needs a  nonischemic evaluation at this time. Continue dual antiplatelet therapy indefinitely with aspirin and Brilinta. Continue metoprolol, nitrates, Ranexa, and Crestor. I will reduce Brilinta to 60 mg bid in 11/2016.  2. Essential HTN: Controlled. No changes. Amlodipine caused edema in the past.  3. Hyperlipidemia: Continue Crestor 40 mg.  4. Bilateral carotid artery stenosis: Mild (1-39%) on 07/07/15. Will repeat in 2 years. Continue aspirin and statin.  5. 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  6. Chronic combined systolic and diastolic heart failure, LVEF 45-50%: Euvolemic. Continue torsemide 60 mg bid.  7. Type 2 diabetes: Markedly elevated A1c in context of recent hospitalizations with infection. I would aim for a goal of less than 8%.      Disposition: Follow up 3 months.   Kate Sable, M.D., F.A.C.C.

## 2016-09-19 NOTE — Patient Instructions (Addendum)
Medication Instructions:   Decrease Brilinta to 60mg  twice a day November 05, 2016.  Call the office for new prescription when ready.   Continue all other medications.    Labwork: none  Testing/Procedures: none  Follow-Up: 3 months   Any Other Special Instructions Will Be Listed Below (If Applicable).  If you need a refill on your cardiac medications before your next appointment, please call your pharmacy.

## 2016-09-20 DIAGNOSIS — E109 Type 1 diabetes mellitus without complications: Secondary | ICD-10-CM | POA: Diagnosis not present

## 2016-09-20 DIAGNOSIS — I252 Old myocardial infarction: Secondary | ICD-10-CM | POA: Diagnosis not present

## 2016-09-20 DIAGNOSIS — I10 Essential (primary) hypertension: Secondary | ICD-10-CM | POA: Diagnosis not present

## 2016-09-20 DIAGNOSIS — Z9861 Coronary angioplasty status: Secondary | ICD-10-CM | POA: Diagnosis not present

## 2016-09-20 DIAGNOSIS — E785 Hyperlipidemia, unspecified: Secondary | ICD-10-CM | POA: Diagnosis not present

## 2016-09-26 ENCOUNTER — Ambulatory Visit (INDEPENDENT_AMBULATORY_CARE_PROVIDER_SITE_OTHER): Payer: BLUE CROSS/BLUE SHIELD | Admitting: Surgery

## 2016-09-26 ENCOUNTER — Encounter: Payer: Self-pay | Admitting: Surgery

## 2016-09-26 ENCOUNTER — Ambulatory Visit (HOSPITAL_COMMUNITY)
Admission: RE | Admit: 2016-09-26 | Discharge: 2016-09-26 | Disposition: A | Discharge status: Home / Self Care | Payer: BLUE CROSS/BLUE SHIELD | Source: Ambulatory Visit | Attending: Surgery | Admitting: Surgery

## 2016-09-26 VITALS — BP 160/73 | HR 77 | Temp 98.2°F | Resp 18 | Ht 70.0 in | Wt 196.0 lb

## 2016-09-26 DIAGNOSIS — I748 Embolism and thrombosis of other arteries: Secondary | ICD-10-CM

## 2016-09-26 LAB — VAS US CAROTID
LEFT ECA DIAS: -4 cm/s
LEFT VERTEBRAL DIAS: -20 cm/s
LICAPDIAS: -19 cm/s
LICAPSYS: -67 cm/s
Left CCA dist dias: -12 cm/s
Left CCA dist sys: -74 cm/s
Left CCA prox dias: 15 cm/s
Left CCA prox sys: 106 cm/s
Left ICA dist dias: -17 cm/s
Left ICA dist sys: -67 cm/s
RCCAPDIAS: 21 cm/s
RIGHT CCA MID DIAS: -9 cm/s
RIGHT ECA DIAS: -4 cm/s
RIGHT VERTEBRAL DIAS: 12 cm/s
Right CCA prox sys: 104 cm/s
Right cca dist sys: -65 cm/s

## 2016-09-26 NOTE — Progress Notes (Signed)
[facility]  Patient name: [name] MRN: [id number] DOB: 12-Dec-1945 Sex: female   REASON FOR VISIT:    Follow up  HISOTRY OF PRESENT ILLNESS:    [name] a 71 y.o.femalewho returns for follow-up. She initially presented to the hospital in May 2017 with a non-STEMI. At that time, a radial pulse on the right could not be palpated and therefore stenting was done through a femoral approach. Dr. early was consulted. The patient had good grip strength however she had some numbness in her hand and no radial pulse. She underwent angiography which revealed a subclavian artery occlusion. I took her to the operating room 1 07/10/2015 in through a right brachial exposure in the axilla, I performed thrombectomy of the right subclavian artery, evacuating acute thrombus. The patient's numbness improved and she developed a palpable pulse. She was ultimately discharged to home.  I sent her to get a compression glove, but it could not be afforded.  She still has sensation challenges in the right hand   PAST MEDICAL HISTORY:   Past Medical History:  Diagnosis Date  . Acute renal failure superimposed on stage 4 chronic kidney disease ([place]) 07/04/2015  . Allergic rhinitis   . Anemia   . Anxiety   . CAD in native artery    NSTEMI 07/2015 s/p DES to RCA and posterior PDA, PCI 10/2015 with scoring balloon to 85% ISR of distal RCA), known LAD/Cx disease treated medically  . Carotid artery disease ([place])    Mild bilateral carotid disease (1-39% 07/2015)  . Chronic combined systolic and diastolic CHF (congestive heart failure) ([place])   . CKD (chronic kidney disease), stage IV ([place])   . Constipation   . Diabetes mellitus   . GERD (gastroesophageal reflux disease)   . Heart murmur, systolic   . History of hysterectomy   . Hyperlipidemia   . Hypertension   . Low back pain   . Obesity   . Occlusion of right  subclavian artery    incidental right distal subclavian artery occlusion s/p thromboembolectomy 07/2015  . Osteoarthritis   . Overactive bladder   . PVC's (premature ventricular contractions)      FAMILY HISTORY:   Family History  Problem Relation Age of Onset  . Diabetes Father   . Hypertension Father   . Stroke Father   . Hypertension Brother   . Aneurysm Brother   . Diabetes Brother   . Colon cancer Neg Hx     SOCIAL HISTORY:   Social History  Substance Use Topics  . Smoking status: Never Smoker  . Smokeless tobacco: Never Used  . Alcohol use No     ALLERGIES:   Allergies  Allergen Reactions  . Ace Inhibitors Cough  . Amlodipine     Edema   . Codeine Rash     CURRENT MEDICATIONS:   Current Outpatient Prescriptions  Medication Sig Dispense Refill  . acetaminophen (TYLENOL) 500 MG tablet Take 2 tablets (1,000 mg total) by mouth every 6 (six) hours as needed (pain). 30 tablet 0  . albuterol (PROVENTIL HFA;VENTOLIN HFA) 108 (90 Base) MCG/ACT inhaler Inhale 1-2 puffs into the lungs every 6 (six) hours as needed for wheezing or shortness of breath. 1 Inhaler 0  . albuterol (PROVENTIL) (2.5 MG/3ML) 0.083% nebulizer solution Take 3 mLs (2.5 mg total) by nebulization every 6 (six) hours as needed for wheezing or shortness of breath. 75 mL 1  . ALPRAZolam (XANAX) 0.5 MG tablet Take  0.5 mg by mouth daily as needed for anxiety or sleep.     Marland Kitchen aspirin EC 81 MG tablet Take 81 mg by mouth daily.    . calcitRIOL (ROCALTROL) 0.25 MCG capsule Take 0.25 mcg by mouth every Monday, Wednesday, and Friday.     . cetirizine (ZYRTEC) 10 MG tablet Take 10 mg by mouth daily as needed for allergies.    . ferrous sulfate 325 (65 FE) MG tablet Take 325 mg by mouth daily with breakfast.     . guaiFENesin (MUCINEX) 600 MG 12 hr tablet Take 600 mg by mouth 2 (two) times daily.     Marland Kitchen HUMALOG KWIKPEN 100 UNIT/ML KiwkPen INJECT 15 TO 21 UNITS TOTAL INTO THE SKIN THREE TIMES DAILY 15 mL 2  .  hydrALAZINE (APRESOLINE) 25 MG tablet Take 2 tablets (50 mg total) by mouth 3 (three) times daily.    Marland Kitchen HYDROcodone-homatropine (HYCODAN) 5-1.5 MG/5ML syrup Take 5 mLs by mouth every 6 (six) hours as needed.  0  . insulin degludec (TRESIBA) 100 UNIT/ML SOPN FlexTouch Pen Inject 40 Units into the skin at bedtime.     Marland Kitchen ipratropium (ATROVENT) 0.06 % nasal spray Place 2 sprays into both nostrils 2 (two) times daily as needed for rhinitis.    Marland Kitchen isosorbide mononitrate (IMDUR) 60 MG 24 hr tablet Take 1 tablet (60 mg total) by mouth daily. 30 tablet 0  . latanoprost (XALATAN) 0.005 % ophthalmic solution Place 1 drop into both eyes at bedtime.     . meclizine (ANTIVERT) 25 MG tablet Take 12.5 mg by mouth 2 (two) times daily.     . metoprolol succinate (TOPROL-XL) 50 MG 24 hr tablet TAKE ONE TABLET BY MOUTH TWICE DAILY WITH  OR  IMMEDIATELY  FOLLOWING  A  MEAL 60 tablet 5  . Multiple Vitamin (MULTIVITAMIN WITH MINERALS) TABS tablet Take 1 tablet by mouth daily.    . nitroGLYCERIN (NITROSTAT) 0.4 MG SL tablet Place 1 tablet (0.4 mg total) under the tongue every 5 (five) minutes as needed for chest pain. 25 tablet 2  . pantoprazole (PROTONIX) 40 MG tablet Take 40 mg by mouth daily.     . potassium chloride SA (K-DUR,KLOR-CON) 20 MEQ tablet Take 2 tablets (40 mEq total) by mouth daily. 60 tablet 5  . RANEXA 500 MG 12 hr tablet TAKE ONE TABLET BY MOUTH TWICE DAILY 60 tablet 5  . rosuvastatin (CRESTOR) 40 MG tablet Take 1 tablet (40 mg total) by mouth at bedtime. 30 tablet 0  . ticagrelor (BRILINTA) 90 MG TABS tablet Take 1 tablet (90 mg total) by mouth 2 (two) times daily. 60 tablet 0  . torsemide (DEMADEX) 20 MG tablet Take 60 mg by mouth 2 (two) times daily. Pt is able to take an additional tablet daily for weight gain greater than 3lbs.    . trimethoprim (TRIMPEX) 100 MG tablet Take 100 mg by mouth daily.      No current facility-administered medications for this visit.     REVIEW OF SYSTEMS:   [X]   denotes positive finding, [ ]  denotes negative finding Cardiac  Comments:  Chest pain or chest pressure:    Shortness of breath upon exertion: x   Short of breath when lying flat:    Irregular heart rhythm:        Vascular    Pain in calf, thigh, or hip brought on by ambulation:    Pain in feet at night that wakes you up from your sleep:  Blood clot in your veins:    Leg swelling:  x       Pulmonary    Oxygen at home:    Productive cough:     Wheezing:         Neurologic    Sudden weakness in arms or legs:     Sudden numbness in arms or legs:     Sudden onset of difficulty speaking or slurred speech:    Temporary loss of vision in one eye:     Problems with dizziness:         Gastrointestinal    Blood in stool:     Vomited blood:         Genitourinary    Burning when urinating:     Blood in urine:        Psychiatric    Major depression:         Hematologic    Bleeding problems:    Problems with blood clotting too easily:        Skin    Rashes or ulcers:        Constitutional    Fever or chills:      PHYSICAL EXAM:   Vitals:   09/26/16 1506 09/26/16 1508  BP: (!) 152/72 (!) 160/73  Pulse: 76 77  Resp: 18 18  Temp: 98.2 F (36.8 C)   SpO2: 99% 100%  Weight: 196 lb (88.9 kg)   Height: 5\' 10"  (1.778 m)     GENERAL: The patient is a well-nourished female, in no acute distress. The vital signs are documented above. CARDIAC: There is a regular rate and rhythm.  VASCULAR: Palpable right radial pulse PULMONARY: Non-labored respirations MUSCULOSKELETAL: There are no major deformities or cyanosis. NEUROLOGIC: Adequate strength to right hand SKIN: There are no ulcers or rashes noted. PSYCHIATRIC: The patient has a normal affect.  STUDIES:   Carotid duplex was performed today.  This shows no significant bilateral external or common carotid stenosis.  Velocity profile within the internal carotid arteries bilaterally is 1-39 percent  MEDICAL ISSUES:    Status post thromboembolectomy of the right innominate and subclavian artery.  The patient has palpable pulse.  She does have some residual sensory issues which I  told her are going to be chronic.  Continue antiplatelet therapy.  No further vascular intervention needed.  She [name] follow up on an as-needed basis.    Annamarie Major, MD [facility] [phone] Pager [phone]

## 2016-09-27 DIAGNOSIS — Z9861 Coronary angioplasty status: Secondary | ICD-10-CM | POA: Diagnosis not present

## 2016-09-27 DIAGNOSIS — E785 Hyperlipidemia, unspecified: Secondary | ICD-10-CM | POA: Diagnosis not present

## 2016-09-27 DIAGNOSIS — I252 Old myocardial infarction: Secondary | ICD-10-CM | POA: Diagnosis not present

## 2016-09-27 DIAGNOSIS — I10 Essential (primary) hypertension: Secondary | ICD-10-CM | POA: Diagnosis not present

## 2016-09-27 DIAGNOSIS — E109 Type 1 diabetes mellitus without complications: Secondary | ICD-10-CM | POA: Diagnosis not present

## 2016-09-28 LAB — HEMOGLOBIN A1C: Hemoglobin A1C: 9.6

## 2016-09-29 DIAGNOSIS — E109 Type 1 diabetes mellitus without complications: Secondary | ICD-10-CM | POA: Diagnosis not present

## 2016-09-29 DIAGNOSIS — E785 Hyperlipidemia, unspecified: Secondary | ICD-10-CM | POA: Diagnosis not present

## 2016-09-29 DIAGNOSIS — Z9861 Coronary angioplasty status: Secondary | ICD-10-CM | POA: Diagnosis not present

## 2016-09-29 DIAGNOSIS — I10 Essential (primary) hypertension: Secondary | ICD-10-CM | POA: Diagnosis not present

## 2016-09-29 DIAGNOSIS — I252 Old myocardial infarction: Secondary | ICD-10-CM | POA: Diagnosis not present

## 2016-09-30 LAB — HM DIABETES EYE EXAM

## 2016-10-05 ENCOUNTER — Encounter: Payer: Self-pay | Admitting: "Endocrinology

## 2016-10-05 ENCOUNTER — Ambulatory Visit (INDEPENDENT_AMBULATORY_CARE_PROVIDER_SITE_OTHER): Payer: BLUE CROSS/BLUE SHIELD | Admitting: "Endocrinology

## 2016-10-05 VITALS — BP 128/70 | HR 75 | Ht 70.0 in | Wt 195.0 lb

## 2016-10-05 DIAGNOSIS — Z794 Long term (current) use of insulin: Secondary | ICD-10-CM | POA: Diagnosis not present

## 2016-10-05 DIAGNOSIS — E1159 Type 2 diabetes mellitus with other circulatory complications: Secondary | ICD-10-CM | POA: Insufficient documentation

## 2016-10-05 DIAGNOSIS — N184 Chronic kidney disease, stage 4 (severe): Secondary | ICD-10-CM

## 2016-10-05 DIAGNOSIS — E1122 Type 2 diabetes mellitus with diabetic chronic kidney disease: Secondary | ICD-10-CM | POA: Diagnosis not present

## 2016-10-05 DIAGNOSIS — E782 Mixed hyperlipidemia: Secondary | ICD-10-CM

## 2016-10-05 DIAGNOSIS — I10 Essential (primary) hypertension: Secondary | ICD-10-CM

## 2016-10-05 DIAGNOSIS — I748 Embolism and thrombosis of other arteries: Secondary | ICD-10-CM

## 2016-10-05 MED ORDER — FREESTYLE LIBRE READER DEVI: 1.0000 | Freq: Once | 0 refills | Status: AC

## 2016-10-05 MED ORDER — FREESTYLE LIBRE SENSOR SYSTEM MISC: 3 refills | Status: DC

## 2016-10-05 MED ORDER — INSULIN LISPRO 100 UNIT/ML (KWIKPEN): PEN_INJECTOR | SUBCUTANEOUS | 2 refills | Status: DC

## 2016-10-05 NOTE — Progress Notes (Signed)
Subjective:    Patient ID: [name], female    DOB: [birth year],    Past Medical History:  Diagnosis Date  . Acute renal failure superimposed on stage 4 chronic kidney disease ([place]) 07/04/2015  . Allergic rhinitis   . Anemia   . Anxiety   . CAD in native artery    NSTEMI 07/2015 s/p DES to RCA and posterior PDA, PCI 10/2015 with scoring balloon to 85% ISR of distal RCA), known LAD/Cx disease treated medically  . Carotid artery disease ([place])    Mild bilateral carotid disease (1-39% 07/2015)  . Chronic combined systolic and diastolic CHF (congestive heart failure) ([place])   . CKD (chronic kidney disease), stage IV ([place])   . Constipation   . Diabetes mellitus   . GERD (gastroesophageal reflux disease)   . Heart murmur, systolic   . History of hysterectomy   . Hyperlipidemia   . Hypertension   . Low back pain   . Obesity   . 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 Osteoarthritis   . Overactive bladder   . PVC's (premature ventricular contractions)    Past Surgical History:  Procedure Laterality Date  . ABDOMINAL HYSTERECTOMY    . BACK SURGERY     multiple  . CARDIAC CATHETERIZATION  04/04/2006   Est EF of 60%  . CARDIAC CATHETERIZATION N/A 07/04/2015   Procedure: Left Heart Cath and Coronary Angiography;  Surgeon: Troy Sine, MD;  Location: [place] [facility];  Service: Cardiovascular;  Laterality: N/A;  . CARDIAC CATHETERIZATION N/A 07/04/2015   Procedure: Coronary Stent Intervention;  Surgeon: Troy Sine, MD;  Location: [facility] [facility];  Service: Cardiovascular;  Laterality: N/A;  . CARDIAC CATHETERIZATION N/A 10/13/2015   Procedure: Left Heart Cath and Coronary Angiography;  Surgeon: Troy Sine, MD;  Location: [facility];  Service: Cardiovascular;  Laterality: N/A;  . CARDIAC CATHETERIZATION N/A 12/02/2015   Procedure: Left Heart Cath and Coronary Angiography;   Surgeon: Peter M Martinique, MD;  Location: [facility];  Service: Cardiovascular;  Laterality: N/A;  . CARPAL TUNNEL RELEASE    . CERVICAL BIOPSY     cervical lymph node biopsies  . COLONOSCOPY  May 2002   Dr. Irving Shows :Followup in 5 years, normal exam  . COLONOSCOPY  2008   Dr. Laural Golden: Very redundant colon with mild melanosis coli, splenic flexure polyp biopsy with acute complaint of benign colon polyp. Recommended ten-year followup  . CORONARY STENT PLACEMENT  04/11/2006   2 -- Taxus stents to the circumflex   . PERIPHERAL VASCULAR CATHETERIZATION Right 07/09/2015   Procedure: Upper Extremity Angiography;  Surgeon: Conrad Matagorda, MD;  Location: [facility];  Service: Cardiovascular;  Laterality: Right;  . PERIPHERAL VASCULAR CATHETERIZATION Right 07/10/2015   Procedure: RIGHT SUBCLAVIAN ARTERY THROMBECTOMY;  Surgeon: Serafina Mitchell, MD;  Location: [facility];  Service: Vascular;  Laterality: Right;  . tendonitis     bilateral elbow  . TRIGGER FINGER RELEASE     Social History   Social History  . Marital status: Married    Spouse name: N/A  . Number of children: 3  . Years of education: N/A   Social History Main Topics  . Smoking status: Never Smoker  . Smokeless tobacco: Never Used  . Alcohol use No  . Drug use: No  . Sexual activity: Not Asked   Other  Topics Concern  . None   Social History Narrative  . None   Outpatient Encounter Prescriptions as of 10/05/2016  Medication Sig  . acetaminophen (TYLENOL) 500 MG tablet Take 2 tablets (1,000 mg total) by mouth every 6 (six) hours as needed (pain).  Marland Kitchen albuterol (PROVENTIL HFA;VENTOLIN HFA) 108 (90 Base) MCG/ACT inhaler Inhale 1-2 puffs into the lungs every 6 (six) hours as needed for wheezing or shortness of breath.  Marland Kitchen albuterol (PROVENTIL) (2.5 MG/3ML) 0.083% nebulizer solution Take 3 mLs (2.5 mg total) by nebulization every 6 (six) hours as needed for wheezing or shortness of breath.  . ALPRAZolam (XANAX) 0.5 MG tablet  Take 0.5 mg by mouth daily as needed for anxiety or sleep.   Marland Kitchen aspirin EC 81 MG tablet Take 81 mg by mouth daily.  . calcitRIOL (ROCALTROL) 0.25 MCG capsule Take 0.25 mcg by mouth every Monday, Wednesday, and Friday.   . cetirizine (ZYRTEC) 10 MG tablet Take 10 mg by mouth daily as needed for allergies.  . Continuous Blood Gluc Receiver (FREESTYLE LIBRE READER) DEVI 1 Piece by Does not apply route once.  . Continuous Blood Gluc Sensor (FREESTYLE LIBRE SENSOR SYSTEM) MISC Use one sensor every 10 days for continuous glucose monitoring.  . ferrous sulfate 325 (65 FE) MG tablet Take 325 mg by mouth daily with breakfast.   . guaiFENesin (MUCINEX) 600 MG 12 hr tablet Take 600 mg by mouth 2 (two) times daily.   . hydrALAZINE (APRESOLINE) 25 MG tablet Take 2 tablets (50 mg total) by mouth 3 (three) times daily.  Marland Kitchen HYDROcodone-homatropine (HYCODAN) 5-1.5 MG/5ML syrup Take 5 mLs by mouth every 6 (six) hours as needed.  . insulin degludec (TRESIBA) 100 UNIT/ML SOPN FlexTouch Pen Inject 40 Units into the skin at bedtime.   . insulin lispro (HUMALOG KWIKPEN) 100 UNIT/ML KiwkPen INJECT 18 TO 24 UNITS TOTAL INTO THE SKIN THREE TIMES DAILY  . ipratropium (ATROVENT) 0.06 % nasal spray Place 2 sprays into both nostrils 2 (two) times daily as needed for rhinitis.  Marland Kitchen isosorbide mononitrate (IMDUR) 60 MG 24 hr tablet Take 1 tablet (60 mg total) by mouth daily.  Marland Kitchen latanoprost (XALATAN) 0.005 % ophthalmic solution Place 1 drop into both eyes at bedtime.   . meclizine (ANTIVERT) 25 MG tablet Take 12.5 mg by mouth 2 (two) times daily.   . metoprolol succinate (TOPROL-XL) 50 MG 24 hr tablet TAKE ONE TABLET BY MOUTH TWICE DAILY WITH  OR  IMMEDIATELY  FOLLOWING  A  MEAL  . Multiple Vitamin (MULTIVITAMIN WITH MINERALS) TABS tablet Take 1 tablet by mouth daily.  . nitroGLYCERIN (NITROSTAT) 0.4 MG SL tablet Place 1 tablet (0.4 mg total) under the tongue every 5 (five) minutes as needed for chest pain.  . pantoprazole (PROTONIX)  40 MG tablet Take 40 mg by mouth daily.   . potassium chloride SA (K-DUR,KLOR-CON) 20 MEQ tablet Take 2 tablets (40 mEq total) by mouth daily.  Marland Kitchen RANEXA 500 MG 12 hr tablet TAKE ONE TABLET BY MOUTH TWICE DAILY  . rosuvastatin (CRESTOR) 40 MG tablet Take 1 tablet (40 mg total) by mouth at bedtime.  . ticagrelor (BRILINTA) 90 MG TABS tablet Take 1 tablet (90 mg total) by mouth 2 (two) times daily.  Marland Kitchen torsemide (DEMADEX) 20 MG tablet Take 60 mg by mouth 2 (two) times daily. Pt is able to take an additional tablet daily for weight gain greater than 3lbs.  . trimethoprim (TRIMPEX) 100 MG tablet Take 100 mg by mouth daily.   . [  DISCONTINUED] HUMALOG KWIKPEN 100 UNIT/ML KiwkPen INJECT 15 TO 21 UNITS TOTAL INTO THE SKIN THREE TIMES DAILY   No facility-administered encounter medications on file as of 10/05/2016.    ALLERGIES: Allergies  Allergen Reactions  . Ace Inhibitors Cough  . Amlodipine     Edema   . Codeine Rash   VACCINATION STATUS:  There is no immunization history on file for this patient.  Diabetes  She presents for her follow-up diabetic visit. She has type 2 diabetes mellitus. Her disease course has been worsening. Pertinent negatives for hypoglycemia include no confusion, headaches, pallor or seizures. Associated symptoms include polydipsia and polyuria. Pertinent negatives for diabetes include no chest pain, no fatigue and no polyphagia. There are no hypoglycemic complications. Symptoms are worsening. Diabetic complications include heart disease, nephropathy and retinopathy. Pertinent negatives for diabetic complications include no CVA. Risk factors for coronary artery disease include diabetes mellitus, dyslipidemia, sedentary lifestyle and hypertension. Current diabetic treatment includes insulin injections. She is compliant with treatment most of the time. Her weight is increasing steadily. She is following a generally unhealthy diet. Meal planning includes avoidance of concentrated  sweets. She rarely participates in exercise. Her home blood glucose trend is fluctuating dramatically. Her overall blood glucose range is >200 mg/dl. (She comes with the 5 days of log showing average blood glucose above 200, did not follow the monitoring and insulin routine she was given last visit.) An ACE inhibitor/angiotensin II receptor blocker is contraindicated (allergic to ACEI). Eye exam is current.  Hypertension  This is a chronic problem. The current episode started more than 1 year ago. The problem has been gradually improving since onset. Pertinent negatives include no chest pain, headaches, palpitations or shortness of breath. Hypertensive end-organ damage includes retinopathy. There is no history of CVA.  Hyperlipidemia  This is a chronic problem. The current episode started more than 1 year ago. Pertinent negatives include no chest pain, myalgias or shortness of breath. Current antihyperlipidemic treatment includes statins.  Coronary Artery Disease  Pertinent negatives include no chest pain, leg swelling, palpitations or shortness of breath. Risk factors include hyperlipidemia and hypertension.     Review of Systems  Constitutional: Negative for fatigue and unexpected weight change.  HENT: Negative for trouble swallowing and voice change.   Eyes: Negative for visual disturbance.  Respiratory: Negative for cough, shortness of breath and wheezing.   Cardiovascular: Negative for chest pain, palpitations and leg swelling.  Gastrointestinal: Negative for diarrhea, nausea and vomiting.  Endocrine: Positive for polydipsia and polyuria. Negative for cold intolerance, heat intolerance and polyphagia.  Musculoskeletal: Negative for arthralgias and myalgias.  Skin: Negative for color change, pallor, rash and wound.  Neurological: Negative for seizures and headaches.  Psychiatric/Behavioral: Negative for confusion and suicidal ideas.    Objective:    BP 128/70   Pulse 75   Ht 5\' 10"   (1.778 m)   Wt 195 lb (88.5 kg)   BMI 27.98 kg/m   Wt Readings from Last 3 Encounters:  10/05/16 195 lb (88.5 kg)  09/26/16 196 lb (88.9 kg)  09/19/16 200 lb 12.8 oz (91.1 kg)    Physical Exam  Constitutional: She is oriented to person, place, and time. She appears well-developed.  HENT:  Head: Normocephalic and atraumatic.  Eyes: EOM are normal.  Neck: Normal range of motion. Neck supple. No tracheal deviation present. No thyromegaly present.  Cardiovascular: Normal rate and regular rhythm.   Pulmonary/Chest: Effort normal and breath sounds normal.  Abdominal: Soft. Bowel sounds are normal.  There is no tenderness. There is no guarding.  Musculoskeletal: Normal range of motion. She exhibits no edema.  Neurological: She is alert and oriented to person, place, and time. She has normal reflexes. No cranial nerve deficit. Coordination normal.  Skin: Skin is warm and dry. No rash noted. No erythema. No pallor.  Psychiatric: She has a normal mood and affect. Judgment normal.    Results for orders placed or performed in visit on 10/05/16  Hemoglobin A1c  Result Value Ref Range   Hemoglobin A1C 9.6    Chemistry (most recent): Lab Results  Component Value Date   NA 138 08/08/2016   K 3.8 08/08/2016   CL 98 (L) 08/08/2016   CO2 30 08/08/2016   BUN 54 (H) 08/08/2016   CREATININE 1.78 (H) 08/08/2016   Diabetic Labs (most recent): Lab Results  Component Value Date   HGBA1C 9.6 09/28/2016   HGBA1C 10.3 (H) 07/09/2016   HGBA1C 9.7 (H) 02/18/2016     Assessment & Plan:   1. Type 2 diabetes mellitus with stage 4 chronic kidney disease and CAD  -She came with blood glucose profile Still above target, recent A1c 9.6% slightly improving from 10.3% last visit.  -Patient is advised to stick to a routine mealtimes to eat 3 meals  a day and avoid unnecessary snacks ( to snack only to correct hypoglycemia). Patient is advised to eliminate simple carbs  from her diet including cakes  desserts ice cream soda (  diet and regular) , sweet tea , Candies,  chips and cookies, artificial sweeteners,   and "sugar-free" products .  This will help patient to have stable blood glucose profile and potentially lose weight. Patient is given detailed personalized glucose monitoring and insulin dosing instructions. Patient is instructed to call back with extremes of blood glucose less than 70 or greater than 300. Patient to bring meter and  blood glucose logs during their next visit.  -  She recently had ACS, Nephrolithiasis. - I urged her to resume strict basal/bolus insulin associated with strict monitoring of blood glucose 4 times a day-before meals and at bedtime.  - I advised her to continue  Tresiba 40  units daily at bedtime, increase Humalog to 18 units 3 times a day before meals plus correction for pre-meal blood glucose above 90 mg/dL.  - She is advised to continue strict monitoring of blood glucose  4 times a day-before meals and at bedtime. -Patient is warned not to take insulin without proper monitoring. -She is encouraged to call clinic if she registers blood glucose below 70 or above 300 mg/dL 3 tests in- a- row.  -She does not have a safe oral treatment option for diabetes given advanced chronic kidney disease.  -Patient specific target for A1c, LDL, and triglycerides were discussed with patient.   2. Essential hypertension Pt is allergic to ACEI, controleld. Advised to continue the rest of her medications.  3. Hyperlipemia -continue Crestor.   Follow up plan: Return in about 3 months (around 01/05/2017) for meter, and logs, follow up with pre-visit labs, meter, and logs.  Glade Lloyd, MD Phone: [phone]  Fax: [phone]   10/05/2016, 11:46 AM

## 2016-10-05 NOTE — Patient Instructions (Signed)

## 2016-10-12 DIAGNOSIS — I252 Old myocardial infarction: Secondary | ICD-10-CM | POA: Diagnosis not present

## 2016-10-19 DIAGNOSIS — I252 Old myocardial infarction: Secondary | ICD-10-CM | POA: Diagnosis not present

## 2016-10-25 ENCOUNTER — Other Ambulatory Visit: Payer: Self-pay | Admitting: *Deleted

## 2016-10-25 NOTE — Patient Outreach (Signed)
[facility]) Care Management  10/25/2016  [name] 1945-04-10 [id number]   Care coordination  [facility] CM called to speak with [name] after her office visit results from Dr Dorris Fetch office HgA1c better at 9.6 versus 10.3 weight remains similar in 190s but he noted she is not recording keeping cbgs and following recommended diet These values indicates possible still some concerns with home management of diabetes and CHF  [facility] CM attempted engagement with [name] in June 2018 but she was not very receptive.  Her responses included "doing alright" and her interest in PT services once approved by insurance carrier.    [facility] CM left a voice message for [name] (HiPPA compliant) to include [facility] CM mobile number for a return call   Plans To attempt to engage with [name] if not follow case closure process   Weber Monnier L. Lavina Hamman, RN, BSN, [place] [facility] [phone]

## 2016-10-25 NOTE — Patient Outreach (Addendum)
[facility]) Care Management  [place]  08/30/16   [name] 09-24-1945 [id number]  Subjective: "doing alright"   Objective:  BP (!) 146/70   Pulse (!) 58   Resp 20   Ht 1.778 m (5' 10")   Wt 196 lb (88.9 kg)   BMI 28.12 kg/m   Encounter Medications:  Outpatient Encounter Prescriptions as of 08/30/2016  Medication Sig  . acetaminophen (TYLENOL) 500 MG tablet Take 2 tablets (1,000 mg total) by mouth every 6 (six) hours as needed (pain).  . meclizine (ANTIVERT) 25 MG tablet Take 12.5 mg by mouth 2 (two) times daily.   Marland Kitchen torsemide (DEMADEX) 20 MG tablet Take 60 mg by mouth 2 (two) times daily. Pt is able to take an additional tablet daily for weight gain greater than 3lbs.  Marland Kitchen albuterol (PROVENTIL HFA;VENTOLIN HFA) 108 (90 Base) MCG/ACT inhaler Inhale 1-2 puffs into the lungs every 6 (six) hours as needed for wheezing or shortness of breath.  Marland Kitchen albuterol (PROVENTIL) (2.5 MG/3ML) 0.083% nebulizer solution Take 3 mLs (2.5 mg total) by nebulization every 6 (six) hours as needed for wheezing or shortness of breath.  . ALPRAZolam (XANAX) 0.5 MG tablet Take 0.5 mg by mouth daily as needed for anxiety or sleep.   Marland Kitchen aspirin EC 81 MG tablet Take 81 mg by mouth daily.  . calcitRIOL (ROCALTROL) 0.25 MCG capsule Take 0.25 mcg by mouth every Monday, Wednesday, and Friday.   . cetirizine (ZYRTEC) 10 MG tablet Take 10 mg by mouth daily as needed for allergies.  . ferrous sulfate 325 (65 FE) MG tablet Take 325 mg by mouth daily with breakfast.   . guaiFENesin (MUCINEX) 600 MG 12 hr tablet Take 600 mg by mouth 2 (two) times daily.   . hydrALAZINE (APRESOLINE) 25 MG tablet Take 2 tablets (50 mg total) by mouth 3 (three) times daily.  . insulin degludec (TRESIBA) 100 UNIT/ML SOPN FlexTouch Pen Inject 40 Units into the skin at bedtime.   Marland Kitchen ipratropium (ATROVENT) 0.06 % nasal spray Place 2 sprays into both nostrils 2 (two) times daily as needed for rhinitis.  Marland Kitchen  isosorbide mononitrate (IMDUR) 60 MG 24 hr tablet Take 1 tablet (60 mg total) by mouth daily.  Marland Kitchen latanoprost (XALATAN) 0.005 % ophthalmic solution Place 1 drop into both eyes at bedtime.   . metoprolol succinate (TOPROL-XL) 50 MG 24 hr tablet TAKE ONE TABLET BY MOUTH TWICE DAILY WITH  OR  IMMEDIATELY  FOLLOWING  A  MEAL  . Multiple Vitamin (MULTIVITAMIN WITH MINERALS) TABS tablet Take 1 tablet by mouth daily.  . nitroGLYCERIN (NITROSTAT) 0.4 MG SL tablet Place 1 tablet (0.4 mg total) under the tongue every 5 (five) minutes as needed for chest pain.  . pantoprazole (PROTONIX) 40 MG tablet Take 40 mg by mouth daily.   . potassium chloride SA (K-DUR,KLOR-CON) 20 MEQ tablet Take 2 tablets (40 mEq total) by mouth daily.  Marland Kitchen RANEXA 500 MG 12 hr tablet TAKE ONE TABLET BY MOUTH TWICE DAILY  . rosuvastatin (CRESTOR) 40 MG tablet Take 1 tablet (40 mg total) by mouth at bedtime.  . ticagrelor (BRILINTA) 90 MG TABS tablet Take 1 tablet (90 mg total) by mouth 2 (two) times daily.  Marland Kitchen trimethoprim (TRIMPEX) 100 MG tablet Take 100 mg by mouth daily.   . [DISCONTINUED] insulin lispro (HUMALOG KWIKPEN) 100 UNIT/ML KiwkPen Inject 0.15-0.21 mLs (15-21 Units total) into the skin 3 (three) times daily. (Patient taking differently: Inject 15-21 Units into the  skin 3 (three) times daily. Pt uses per sliding scale.)   No facility-administered encounter medications on file as of 08/30/2016.     Assessment:   [facility] CM met [name] and [name] at [name] office after frequent attempts to engage with [name] Initially [name] would not interact with [facility] CM but [name] did- [name] responses were "doing alright"  [name] was given [name] [name] calendar and pill box. 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 [name] also encouraged [name] to use menthol cream then take tylenol for pain  She informed [name] she coughs lot at night which interrupts [name] sleep but she is not taking tessalon pearls as recommended by pcp Has seen ENT on 08/29/16 was informed that she has allergies with productive sputum.  [name] gave nose spray bid to stop drainage Denies wheezing Some leg swelling noted with weight at 196 lb today at [name] office but at [name] office [name] weight was 193 lbs [name] does have scales at home and she states [name] scales say 194-5 lbs [facility] CM discussed 80/20 out of pocket expense for PT coverages [name] confirms she is still working and bcbs is primary and medicare is secondary c/o vertigo that [name] addressed  [name] noted a lump that [name] states is from an injection of heparin on [name] abdomen [name] encouraged rotating insulin injections in thighs, arms, etc nephrologist seen 2 weeks ago (she) changed fluid pills take 3  [name] complained of a mole on [name] nose and [name] referred [name] to  dermatology  discussed send note to CV about [name] entresto [name] prefer calls to office as communication   Plan:  To follow [name] for any further [facility] CM needs every 4-8 weeks as agreed [facility] CM Care Plan Problem One     Most Recent Value  Care Plan Problem One  knowledge of home management of diabetes and congestive heart failure  Role Documenting the Problem One  Care Management Coordinator  Care Plan for Problem One  Active  [facility]N Long Term Goal   over the next 90 days patient will show improvements in  home care for Diabetes and chf as evidence by cbg and weight values  [facility]N Long Term Goal Start Date  08/30/16  Interventions for Problem One Long Term Goal  Review standard home are for diabetes, chf, chf zones, continue to try to engage for compliance   [facility] CM Short Term Goal #1   over the next 45 days patient will show improvements in home care for Diabetes and chf as evidence by cbg and weight values  [facility] CM Short Term Goal #1 Start Date  08/30/16  Interventions for Short Term Goal #1  Review standard home are for  diabetes, chf, chf zones, continue to try to engage for compliance        L. Lavina Hamman, RN, BSN, [place] Care Management [phone]

## 2016-10-27 ENCOUNTER — Other Ambulatory Visit: Payer: Self-pay | Admitting: *Deleted

## 2016-10-28 ENCOUNTER — Encounter (HOSPITAL_COMMUNITY): Admission: RE | Admit: 2016-10-28 | Payer: BLUE CROSS/BLUE SHIELD | Source: Ambulatory Visit

## 2016-10-28 NOTE — Patient Outreach (Signed)
[facility]) Care Management  [facility] Care Manager  10/27/2016   [name] [birth year] [id number]  Subjective:   Objective:   Encounter Medications:  Outpatient Encounter Prescriptions as of 10/27/2016  Medication Sig  . acetaminophen (TYLENOL) 500 MG tablet Take 2 tablets (1,000 mg total) by mouth every 6 (six) hours as needed (pain).  Marland Kitchen albuterol (PROVENTIL HFA;VENTOLIN HFA) 108 (90 Base) MCG/ACT inhaler Inhale 1-2 puffs into the lungs every 6 (six) hours as needed for wheezing or shortness of breath.  Marland Kitchen albuterol (PROVENTIL) (2.5 MG/3ML) 0.083% nebulizer solution Take 3 mLs (2.5 mg total) by nebulization every 6 (six) hours as needed for wheezing or shortness of breath.  . ALPRAZolam (XANAX) 0.5 MG tablet Take 0.5 mg by mouth daily as needed for anxiety or sleep.   Marland Kitchen aspirin EC 81 MG tablet Take 81 mg by mouth daily.  . calcitRIOL (ROCALTROL) 0.25 MCG capsule Take 0.25 mcg by mouth every Monday, Wednesday, and Friday.   . cetirizine (ZYRTEC) 10 MG tablet Take 10 mg by mouth daily as needed for allergies.  . Continuous Blood Gluc Sensor (FREESTYLE LIBRE SENSOR SYSTEM) MISC Use one sensor every 10 days for continuous glucose monitoring.  . ferrous sulfate 325 (65 FE) MG tablet Take 325 mg by mouth daily with breakfast.   . guaiFENesin (MUCINEX) 600 MG 12 hr tablet Take 600 mg by mouth 2 (two) times daily.   . hydrALAZINE (APRESOLINE) 25 MG tablet Take 2 tablets (50 mg total) by mouth 3 (three) times daily.  Marland Kitchen HYDROcodone-homatropine (HYCODAN) 5-1.5 MG/5ML syrup Take 5 mLs by mouth every 6 (six) hours as needed.  . insulin degludec (TRESIBA) 100 UNIT/ML SOPN FlexTouch Pen Inject 40 Units into the skin at bedtime.   . insulin lispro (HUMALOG KWIKPEN) 100 UNIT/ML KiwkPen INJECT 18 TO 24 UNITS TOTAL INTO THE SKIN THREE TIMES DAILY  . ipratropium (ATROVENT) 0.06 % nasal spray Place 2 sprays into both nostrils 2 (two) times daily as needed for rhinitis.  Marland Kitchen isosorbide  mononitrate (IMDUR) 60 MG 24 hr tablet Take 1 tablet (60 mg total) by mouth daily.  Marland Kitchen latanoprost (XALATAN) 0.005 % ophthalmic solution Place 1 drop into both eyes at bedtime.   . meclizine (ANTIVERT) 25 MG tablet Take 12.5 mg by mouth 2 (two) times daily.   . metoprolol succinate (TOPROL-XL) 50 MG 24 hr tablet TAKE ONE TABLET BY MOUTH TWICE DAILY WITH  OR  IMMEDIATELY  FOLLOWING  A  MEAL  . Multiple Vitamin (MULTIVITAMIN WITH MINERALS) TABS tablet Take 1 tablet by mouth daily.  . nitroGLYCERIN (NITROSTAT) 0.4 MG SL tablet Place 1 tablet (0.4 mg total) under the tongue every 5 (five) minutes as needed for chest pain.  . pantoprazole (PROTONIX) 40 MG tablet Take 40 mg by mouth daily.   . potassium chloride SA (K-DUR,KLOR-CON) 20 MEQ tablet Take 2 tablets (40 mEq total) by mouth daily.  Marland Kitchen RANEXA 500 MG 12 hr tablet TAKE ONE TABLET BY MOUTH TWICE DAILY  . rosuvastatin (CRESTOR) 40 MG tablet Take 1 tablet (40 mg total) by mouth at bedtime.  . ticagrelor (BRILINTA) 90 MG TABS tablet Take 1 tablet (90 mg total) by mouth 2 (two) times daily.  Marland Kitchen torsemide (DEMADEX) 20 MG tablet Take 60 mg by mouth 2 (two) times daily. Pt is able to take an additional tablet daily for weight gain greater than 3lbs.  . trimethoprim (TRIMPEX) 100 MG tablet Take 100 mg by mouth daily.    No facility-administered encounter  medications on file as of 10/27/2016.     Functional Status:  In your present state of health, do you have any difficulty performing the following activities: 10/28/2016 08/07/2016  Hearing? N N  Vision? N N  Difficulty concentrating or making decisions? N N  Walking or climbing stairs? Y N  Dressing or bathing? N N  Doing errands, shopping? Y N  Preparing Food and eating ? N -  Using the Toilet? N -  In the past six months, have you accidently leaked urine? N -  Do you have problems with loss of bowel control? N -  Managing your Medications? N -  Managing your Finances? N -  Housekeeping or managing  your Housekeeping? N -  Some recent data might be hidden    Fall/Depression Screening: Fall Risk  10/27/2016 10/05/2016 10/05/2016  Falls in the past year? No No No  Risk for fall due to : Impaired mobility - -   PHQ 2/9 Scores 10/27/2016 10/05/2016 10/05/2016 08/22/2016 03/22/2016 06/30/2015 03/30/2015  PHQ - 2 Score 0 0 0 0 0 0 0  PHQ- 9 Score - - - - - - -    Assessment:  [facility] CM called [name] to follow up with her on her medical issues  Diabetes-  [name] confirmed she schedule to see Dr Dorris Fetch in 3-4 weeks  [name] continues to report she is doing well and denies any medical concerns  [facility] CM discussed her progression in the [facility] CM community services and her preferences [name] has agreed to discontinue services with [place] services at this time   Plan: case closure as agreed upon by [name] Elaina Pattee Note routed to care team providers [facility] goals met  [facility] CM Care Plan Problem One     Most Recent Value  Care Plan Problem One  knowledge of home management of diabetes and congestive heart failure  Role Documenting the Problem One  Care Management Coordinator  Care Plan for Problem One  Active  [facility] Long Term Goal   over the next 90 days patient will show improvements in home care for Diabetes and chf as evidence by cbg and weight values  [facility] Long Term Goal Start Date  08/30/16  [facility] Long Term Goal Met Date  10/27/16  Interventions for Problem One Long Term Goal  Review standard home are for diabetes, chf, chf zones, continue to try to engage for compliance   [facility] CM Short Term Goal #1   over the next 45 days patient will show improvements in home care for Diabetes and chf as evidence by cbg and weight values  [facility] CM Short Term Goal #1 Start Date  08/30/16  [facility] CM Short Term Goal #1 Met Date  10/27/16  Interventions for Short Term Goal #1  Review standard home are for diabetes, chf, chf zones, continue to try to engage for compliance       Taniya Dasher L. Lavina Hamman, RN, BSN,  [facility] Care Management [phone]

## 2016-10-29 ENCOUNTER — Emergency Department (HOSPITAL_COMMUNITY): Payer: BLUE CROSS/BLUE SHIELD

## 2016-10-29 ENCOUNTER — Emergency Department (HOSPITAL_COMMUNITY)
Admission: EM | Admit: 2016-10-29 | Discharge: 2016-10-29 | Disposition: A | Discharge status: Home / Self Care | Payer: BLUE CROSS/BLUE SHIELD | Attending: Emergency Medicine | Admitting: Emergency Medicine

## 2016-10-29 ENCOUNTER — Encounter (HOSPITAL_COMMUNITY): Payer: Self-pay | Admitting: Emergency Medicine

## 2016-10-29 DIAGNOSIS — N184 Chronic kidney disease, stage 4 (severe): Secondary | ICD-10-CM | POA: Insufficient documentation

## 2016-10-29 DIAGNOSIS — I2510 Atherosclerotic heart disease of native coronary artery without angina pectoris: Secondary | ICD-10-CM | POA: Diagnosis not present

## 2016-10-29 DIAGNOSIS — Z794 Long term (current) use of insulin: Secondary | ICD-10-CM | POA: Diagnosis not present

## 2016-10-29 DIAGNOSIS — I129 Hypertensive chronic kidney disease with stage 1 through stage 4 chronic kidney disease, or unspecified chronic kidney disease: Secondary | ICD-10-CM | POA: Insufficient documentation

## 2016-10-29 DIAGNOSIS — I5042 Chronic combined systolic (congestive) and diastolic (congestive) heart failure: Secondary | ICD-10-CM | POA: Diagnosis not present

## 2016-10-29 DIAGNOSIS — S161XXD Strain of muscle, fascia and tendon at neck level, subsequent encounter: Secondary | ICD-10-CM

## 2016-10-29 DIAGNOSIS — I110 Hypertensive heart disease with heart failure: Secondary | ICD-10-CM | POA: Diagnosis not present

## 2016-10-29 DIAGNOSIS — M4802 Spinal stenosis, cervical region: Secondary | ICD-10-CM | POA: Insufficient documentation

## 2016-10-29 DIAGNOSIS — E1122 Type 2 diabetes mellitus with diabetic chronic kidney disease: Secondary | ICD-10-CM | POA: Insufficient documentation

## 2016-10-29 DIAGNOSIS — X503XXA Overexertion from repetitive movements, initial encounter: Secondary | ICD-10-CM | POA: Diagnosis not present

## 2016-10-29 DIAGNOSIS — S199XXA Unspecified injury of neck, initial encounter: Secondary | ICD-10-CM | POA: Diagnosis present

## 2016-10-29 DIAGNOSIS — Z7982 Long term (current) use of aspirin: Secondary | ICD-10-CM | POA: Insufficient documentation

## 2016-10-29 DIAGNOSIS — Y939 Activity, unspecified: Secondary | ICD-10-CM | POA: Insufficient documentation

## 2016-10-29 DIAGNOSIS — Y929 Unspecified place or not applicable: Secondary | ICD-10-CM | POA: Diagnosis not present

## 2016-10-29 DIAGNOSIS — Z79899 Other long term (current) drug therapy: Secondary | ICD-10-CM | POA: Diagnosis not present

## 2016-10-29 DIAGNOSIS — I252 Old myocardial infarction: Secondary | ICD-10-CM | POA: Diagnosis not present

## 2016-10-29 DIAGNOSIS — Y999 Unspecified external cause status: Secondary | ICD-10-CM | POA: Diagnosis not present

## 2016-10-29 MED ORDER — DIAZEPAM 5 MG PO TABS: 5.0000 mg | ORAL_TABLET | Freq: Two times a day (BID) | ORAL | 0 refills | Status: DC | PRN

## 2016-10-29 NOTE — ED Triage Notes (Signed)
Pt reports pain in left side of neck after doing exercises in cardiac rehab last week.  Denies injury.  States pcp rx muscle relaxers, but not helping.

## 2016-10-29 NOTE — ED Provider Notes (Signed)
[place] DEPT Provider Note   CSN: [id number] Arrival date & time: 10/29/16  1705     History   Chief Complaint Chief Complaint  Patient presents with  . Torticollis    HPI [name] is a 71 y.o. female.  HPI Patient presents with left-sided neck pain for the past one to 2 weeks. Thinks she may injured it while in cardiac rehabilitation. Says the pain is worse with turning to the left. Denies any numbness or weakness in her arms or legs. Denies fever or chills. No difficulty breathing. She's been taking muscle relaxants and Tylenol at home with little relief. Patient states heat actually improves her symptoms. Past Medical History:  Diagnosis Date  . Acute renal failure superimposed on stage 4 chronic kidney disease ([place]) 07/04/2015  . Allergic rhinitis   . Anemia   . Anxiety   . CAD in native artery    NSTEMI 07/2015 s/p DES to RCA and posterior PDA, PCI 10/2015 with scoring balloon to 85% ISR of distal RCA), known LAD/Cx disease treated medically  . Carotid artery disease ([place])    Mild bilateral carotid disease (1-39% 07/2015)  . Chronic combined systolic and diastolic CHF (congestive heart failure) ([place])   . CKD (chronic kidney disease), stage IV ([place])   . Constipation   . Diabetes mellitus   . GERD (gastroesophageal reflux disease)   . Heart murmur, systolic   . History of hysterectomy   . Hyperlipidemia   . Hypertension   . Low back pain   . Obesity   . 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 Osteoarthritis   . Overactive bladder   . PVC's (premature ventricular contractions)     Patient Active Problem List   Diagnosis Date Noted  . DM type 2 causing vascular disease ([place]) 10/05/2016  . Personal history of noncompliance with medical treatment, presenting hazards to health 08/22/2016  . Hyperglycemia 08/07/2016  . Pulmonary edema 07/14/2016  . CKD (chronic kidney  disease), stage IV ([place]) 07/14/2016  . Acute respiratory failure with hypoxia ([place]) 07/09/2016  . Acute on chronic systolic CHF (congestive heart failure) ([place]) 12/01/2015  . AKI (acute kidney injury) ([place])   . Ischemia of upper extremity   . Right knee pain   . Subclavian artery stenosis, right ([place]) 07/07/2015  . Acute on chronic combined systolic and diastolic CHF (congestive heart failure) ([place]) 07/04/2015  . Elevated troponin 07/04/2015  . NSTEMI (non-ST elevated myocardial infarction) ([place]) 07/04/2015  . Elevated d-dimer 07/04/2015  . Acute respiratory failure with hypercapnia ([place])   . Bilateral lower extremity edema 01/20/2012  . Type 2 diabetes mellitus with stage 4 chronic kidney disease, with long-term current use of insulin ([place]) 01/21/2011  . Overweight 07/20/2009  . Atherosclerosis of native coronary artery of native heart with unstable angina pectoris ([place]) 10/08/2008  . HEART MURMUR, SYSTOLIC 54/65/0354  . SHOULDER PAIN 02/05/2007  . Hyperlipemia 04/11/2006  . ANXIETY 04/11/2006  . SYNDROME, CARPAL TUNNEL 04/11/2006  . Essential hypertension 04/11/2006  . ALLERGIC RHINITIS 04/11/2006  . GERD 04/11/2006  . Constipation 04/11/2006  . OVERACTIVE BLADDER 04/11/2006  . OSTEOARTHRITIS 04/11/2006  . LOW BACK PAIN 04/11/2006    Past Surgical History:  Procedure Laterality Date  . ABDOMINAL HYSTERECTOMY    . BACK SURGERY     multiple  . CARDIAC CATHETERIZATION  04/04/2006   Est EF of 60%  . CARDIAC CATHETERIZATION N/A 07/04/2015  Procedure: Left Heart Cath and Coronary Angiography;  Surgeon: Troy Sine, MD;  Location: [facility];  Service: Cardiovascular;  Laterality: N/A;  . CARDIAC CATHETERIZATION N/A 07/04/2015   Procedure: Coronary Stent Intervention;  Surgeon: Troy Sine, MD;  Location: [facility];  Service: Cardiovascular;  Laterality: N/A;  . CARDIAC CATHETERIZATION N/A 10/13/2015   Procedure: Left Heart Cath and Coronary Angiography;   Surgeon: Troy Sine, MD;  Location: [place];  Service: Cardiovascular;  Laterality: N/A;  . CARDIAC CATHETERIZATION N/A 12/02/2015   Procedure: Left Heart Cath and Coronary Angiography;  Surgeon: Peter M Martinique, MD;  Location: [facility];  Service: Cardiovascular;  Laterality: N/A;  . CARPAL TUNNEL RELEASE    . CERVICAL BIOPSY     cervical lymph node biopsies  . COLONOSCOPY  May 2002   Dr. Irving Shows :Followup in 5 years, normal exam  . COLONOSCOPY  2008   Dr. Laural Golden: Very redundant colon with mild melanosis coli, splenic flexure polyp biopsy with acute complaint of benign colon polyp. Recommended ten-year followup  . CORONARY STENT PLACEMENT  04/11/2006   2 -- Taxus stents to the circumflex   . PERIPHERAL VASCULAR CATHETERIZATION Right 07/09/2015   Procedure: Upper Extremity Angiography;  Surgeon: Conrad Linden, MD;  Location: [facility];  Service: Cardiovascular;  Laterality: Right;  . PERIPHERAL VASCULAR CATHETERIZATION Right 07/10/2015   Procedure: RIGHT SUBCLAVIAN ARTERY THROMBECTOMY;  Surgeon: Serafina Mitchell, MD;  Location: [facility] OR;  Service: Vascular;  Laterality: Right;  . tendonitis     bilateral elbow  . TRIGGER FINGER RELEASE      OB History    No data available       Home Medications    Prior to Admission medications   Medication Sig Start Date End Date Taking? Authorizing Provider  acetaminophen (TYLENOL) 500 MG tablet Take 2 tablets (1,000 mg total) by mouth every 6 (six) hours as needed (pain). 07/12/15  Yes Lily Kocher, MD  albuterol (PROVENTIL HFA;VENTOLIN HFA) 108 (90 Base) MCG/ACT inhaler Inhale 1-2 puffs into the lungs every 6 (six) hours as needed for wheezing or shortness of breath. 04/01/16  Yes Nanavati, Ankit, MD  albuterol (PROVENTIL) (2.5 MG/3ML) 0.083% nebulizer solution Take 3 mLs (2.5 mg total) by nebulization every 6 (six) hours as needed for wheezing or shortness of breath. 07/16/16  Yes Elgergawy, Silver Huguenin, MD  aspirin EC 81 MG  tablet Take 81 mg by mouth daily.   Yes [provider]  Continuous Blood Gluc Sensor ([place]) MISC Use one sensor every 10 days for continuous glucose monitoring. 10/05/16  Yes Nida, Marella Chimes, MD  cyclobenzaprine (FLEXERIL) 5 MG tablet Take 5 mg by mouth at bedtime. 10/03/16  Yes [provider]  ferrous sulfate 325 (65 FE) MG tablet Take 325 mg by mouth daily with breakfast.    Yes [provider]  fluticasone (FLONASE) 50 MCG/ACT nasal spray Place 1 spray into both nostrils daily.   Yes [provider]  hydrALAZINE (APRESOLINE) 25 MG tablet Take 2 tablets (50 mg total) by mouth 3 (three) times daily. 01/21/16  Yes Herminio Commons, MD  HYDROcodone-homatropine (HYCODAN) 5-1.5 MG/5ML syrup Take 5 mLs by mouth every 6 (six) hours as needed for cough.  08/30/16  Yes [provider]  insulin degludec (TRESIBA) 100 UNIT/ML SOPN FlexTouch Pen Inject 40 Units into the skin at bedtime.    Yes [provider]  insulin  lispro (HUMALOG KWIKPEN) 100 UNIT/ML KiwkPen INJECT 18 TO 24 UNITS TOTAL INTO THE SKIN THREE TIMES DAILY 10/05/16  Yes Nida, Marella Chimes, MD  isosorbide mononitrate (IMDUR) 60 MG 24 hr tablet Take 1 tablet (60 mg total) by mouth daily. 07/12/15  Yes Lily Kocher, MD  latanoprost (XALATAN) 0.005 % ophthalmic solution Place 1 drop into both eyes at bedtime.    Yes [provider]  meclizine (ANTIVERT) 25 MG tablet Take 12.5 mg by mouth 2 (two) times daily.    Yes [provider]  metoprolol succinate (TOPROL-XL) 50 MG 24 hr tablet TAKE ONE TABLET BY MOUTH TWICE DAILY WITH  OR  IMMEDIATELY  FOLLOWING  A  MEAL 04/28/16  Yes Lyda Jester M, PA-C  Multiple Vitamin (MULTIVITAMIN WITH MINERALS) TABS tablet Take 1 tablet by mouth daily.   Yes [provider]  nitroGLYCERIN (NITROSTAT) 0.4 MG SL tablet Place 1 tablet (0.4 mg total) under the tongue every 5 (five) minutes as needed for chest  pain. 10/14/15  Yes Simmons, Brittainy M, PA-C  pantoprazole (PROTONIX) 40 MG tablet Take 40 mg by mouth daily.    Yes [provider]  potassium chloride SA (K-DUR,KLOR-CON) 20 MEQ tablet Take 2 tablets (40 mEq total) by mouth daily. 10/14/15  Yes Simmons, Brittainy M, PA-C  RANEXA 500 MG 12 hr tablet TAKE ONE TABLET BY MOUTH TWICE DAILY 05/04/16  Yes Troy Sine, MD  rosuvastatin (CRESTOR) 40 MG tablet Take 1 tablet (40 mg total) by mouth at bedtime. 07/12/15  Yes Lily Kocher, MD  ticagrelor (BRILINTA) 90 MG TABS tablet Take 1 tablet (90 mg total) by mouth 2 (two) times daily. 10/14/15  Yes Rosita Fire, Brittainy M, PA-C  torsemide (DEMADEX) 20 MG tablet Take 60 mg by mouth 2 (two) times daily. Pt is able to take an additional tablet daily for weight gain greater than 3lbs.   Yes [provider]  trolamine salicylate (ASPERCREME) 10 % cream Apply 1 application topically as needed for muscle pain.   Yes [provider]  diazepam (VALIUM) 5 MG tablet Take 1 tablet (5 mg total) by mouth every 12 (twelve) hours as needed for muscle spasms. 10/29/16   Julianne Rice, MD    Family History Family History  Problem Relation Age of Onset  . Diabetes Father   . Hypertension Father   . Stroke Father   . Hypertension Brother   . Aneurysm Brother   . Diabetes Brother   . Colon cancer Neg Hx     Social History Social History  Substance Use Topics  . Smoking status: Never Smoker  . Smokeless tobacco: Never Used  . Alcohol use No     Allergies   Ace inhibitors; Amlodipine; and Codeine   Review of Systems Review of Systems  Constitutional: Negative for chills and fever.  HENT: Negative for facial swelling, sore throat, trouble swallowing and voice change.   Eyes: Negative for visual disturbance.  Respiratory: Negative for cough and shortness of breath.   Cardiovascular: Negative for chest pain, palpitations and leg swelling.  Gastrointestinal: Negative for abdominal  pain, diarrhea, nausea and vomiting.  Genitourinary: Negative for dysuria, flank pain and frequency.  Musculoskeletal: Positive for myalgias, neck pain and neck stiffness. Negative for arthralgias and back pain.  Skin: Negative for rash.  Neurological: Negative for dizziness, weakness, light-headedness, numbness and headaches.  All other systems reviewed and are negative.    Physical Exam Updated Vital Signs BP (!) 147/63 (BP Location: Right Arm)  Pulse 75   Temp 98 F (36.7 C) (Oral)   Resp 18   Ht 5\' 8"  (1.727 m)   Wt 89.4 kg (197 lb)   SpO2 100%   BMI 29.95 kg/m   Physical Exam  Constitutional: She is oriented to person, place, and time. She appears well-developed and well-nourished. No distress.  HENT:  Head: Normocephalic and atraumatic.  Mouth/Throat: Oropharynx is clear and moist. No oropharyngeal exudate.  Eyes: Pupils are equal, round, and reactive to light. EOM are normal.  Neck: Neck supple.  Decreased range of motion especially with turning the head to the left. Patient appears to have tenderness to palpation over the sternocleidomastoid. No obvious masses. No bruits or stridor appreciated.No posterior midline cervical tenderness to palpation.  Cardiovascular: Normal rate and regular rhythm.  Exam reveals no gallop and no friction rub.   No murmur heard. Pulmonary/Chest: Effort normal and breath sounds normal. No respiratory distress. She has no wheezes. She has no rales. She exhibits no tenderness.  Abdominal: Soft. Bowel sounds are normal. There is no tenderness. There is no rebound and no guarding.  Musculoskeletal: Normal range of motion. She exhibits no edema or tenderness.  No midline thoracic or lumbar tenderness. 2+ distal pulses in all extremities.  Lymphadenopathy:    She has no cervical adenopathy.  Neurological: She is alert and oriented to person, place, and time.  5/5 grip strength bilaterally. Patient is ambulating without any difficulty. Sensation  intact.  Skin: Skin is warm and dry. Capillary refill takes less than 2 seconds. No rash noted. No erythema.  Psychiatric: She has a normal mood and affect. Her behavior is normal.  Nursing note and vitals reviewed.    ED Treatments / Results  Labs (all labs ordered are listed, but only abnormal results are displayed) Labs Reviewed - No data to display  EKG  EKG Interpretation None       Radiology Ct Soft Tissue Neck Wo Contrast  Result Date: 10/29/2016 CLINICAL DATA:  Left-sided neck pain after doing exercises in cardiac rehab last week. EXAM: CT NECK WITHOUT CONTRAST TECHNIQUE: Multidetector CT imaging of the neck was performed following the standard protocol without intravenous contrast. COMPARISON:  None. FINDINGS: Pharynx and larynx: No noted mass or inflammation. Limited at the level of the larynx due to patient motion. Salivary glands: No inflammation, mass, or stone. Thyroid: Normal. Lymph nodes: No enlargement or abnormal density of cervical lymph nodes. Vascular: Limited without contrast. Moderate calcified plaque at the carotid bifurcations. Limited intracranial: Negative Visualized orbits: Partly seen. No acute finding. Right cataract resection. Mastoids and visualized paranasal sinuses: Lobulated soft tissue in the left maxillary could be polyp or lobulated retention cyst. No destructive changes in the neighboring bone. Skeleton: Negative for acute or destructive finding. There are calcified disc bulges from C2-3 to C6-7, with ligamentum flavum thickening and calcification at C4-5. These cause cord deformity at C3-4 and C4-5. Patient has undergone laminectomy at C5-6. Upper chest: Left peritracheal soft tissue is likely multiple borderline enlarged lymph nodes rather than 1 large node or nodal conglomerate. These could be congestive given layering pleural effusions, small where seen. IMPRESSION: 1. Limited by patient motion and lack of IV contrast. No specific explanation for acute  neck pain. 2. Advanced cervical spine degeneration with calcified disc bulges and ligamentum flavum thickening. 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 3. Layering pleural effusions, small where seen. 4. Polypoid soft tissue in the left maxillary sinus,  nonspecific without contrast. Electronically Signed   By: Monte Fantasia M.D.   On: 10/29/2016 18:39    Procedures Procedures (including critical care time)  Medications Ordered in ED Medications - No data to display   Initial Impression / Assessment and Plan / ED Course  I have reviewed the triage vital signs and the nursing notes.  Pertinent labs & imaging results that were available during my care of the patient were reviewed by me and considered in my medical decision making (see chart for details).    Patient continues to have a normal neurologic exam. CT does show some evidence of spinal stenosis. This unlikely related to the pain she is having over the lateral musculature of the neck. 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 She's been using muscle relaxants with little improvement. Will start on diazepam. She is advised to stop taking her nightly benzodiazepine. Advised to continue using Tylenol and heat. Advised to follow with her primary physician and with neurosurgery as needed. Return precautions given.   Final Clinical Impressions(s) / ED Diagnoses   Final diagnoses:  Strain of neck muscle, subsequent encounter  Cervical stenosis of spinal canal    New Prescriptions New Prescriptions   DIAZEPAM (VALIUM) 5 MG TABLET    Take 1 tablet (5 mg total) by mouth every 12 (twelve) hours as needed for muscle spasms.     Julianne Rice, MD 10/29/16 [phone]

## 2016-10-31 ENCOUNTER — Observation Stay (HOSPITAL_COMMUNITY)
Admission: EM | Admit: 2016-10-31 | Discharge: 2016-11-01 | Disposition: A | Discharge status: Home / Self Care | Payer: BLUE CROSS/BLUE SHIELD | Attending: Internal Medicine | Admitting: Internal Medicine

## 2016-10-31 ENCOUNTER — Emergency Department (HOSPITAL_COMMUNITY): Payer: BLUE CROSS/BLUE SHIELD

## 2016-10-31 ENCOUNTER — Inpatient Hospital Stay (HOSPITAL_COMMUNITY): Payer: BLUE CROSS/BLUE SHIELD

## 2016-10-31 ENCOUNTER — Encounter (HOSPITAL_COMMUNITY): Payer: Self-pay | Admitting: *Deleted

## 2016-10-31 DIAGNOSIS — Z9889 Other specified postprocedural states: Secondary | ICD-10-CM

## 2016-10-31 DIAGNOSIS — J9811 Atelectasis: Secondary | ICD-10-CM | POA: Diagnosis not present

## 2016-10-31 DIAGNOSIS — Z794 Long term (current) use of insulin: Secondary | ICD-10-CM | POA: Insufficient documentation

## 2016-10-31 DIAGNOSIS — E7800 Pure hypercholesterolemia, unspecified: Secondary | ICD-10-CM | POA: Diagnosis not present

## 2016-10-31 DIAGNOSIS — M4802 Spinal stenosis, cervical region: Secondary | ICD-10-CM | POA: Diagnosis not present

## 2016-10-31 DIAGNOSIS — I6529 Occlusion and stenosis of unspecified carotid artery: Secondary | ICD-10-CM | POA: Diagnosis not present

## 2016-10-31 DIAGNOSIS — M50221 Other cervical disc displacement at C4-C5 level: Secondary | ICD-10-CM | POA: Diagnosis not present

## 2016-10-31 DIAGNOSIS — E785 Hyperlipidemia, unspecified: Secondary | ICD-10-CM | POA: Diagnosis not present

## 2016-10-31 DIAGNOSIS — F419 Anxiety disorder, unspecified: Secondary | ICD-10-CM | POA: Insufficient documentation

## 2016-10-31 DIAGNOSIS — Y95 Nosocomial condition: Secondary | ICD-10-CM | POA: Diagnosis not present

## 2016-10-31 DIAGNOSIS — Z7982 Long term (current) use of aspirin: Secondary | ICD-10-CM | POA: Diagnosis not present

## 2016-10-31 DIAGNOSIS — I5042 Chronic combined systolic (congestive) and diastolic (congestive) heart failure: Secondary | ICD-10-CM | POA: Diagnosis not present

## 2016-10-31 DIAGNOSIS — D649 Anemia, unspecified: Secondary | ICD-10-CM | POA: Diagnosis not present

## 2016-10-31 DIAGNOSIS — J90 Pleural effusion, not elsewhere classified: Secondary | ICD-10-CM | POA: Diagnosis not present

## 2016-10-31 DIAGNOSIS — K219 Gastro-esophageal reflux disease without esophagitis: Secondary | ICD-10-CM | POA: Diagnosis not present

## 2016-10-31 DIAGNOSIS — J189 Pneumonia, unspecified organism: Secondary | ICD-10-CM

## 2016-10-31 DIAGNOSIS — M1990 Unspecified osteoarthritis, unspecified site: Secondary | ICD-10-CM | POA: Insufficient documentation

## 2016-10-31 DIAGNOSIS — N184 Chronic kidney disease, stage 4 (severe): Secondary | ICD-10-CM | POA: Insufficient documentation

## 2016-10-31 DIAGNOSIS — I10 Essential (primary) hypertension: Secondary | ICD-10-CM | POA: Diagnosis not present

## 2016-10-31 DIAGNOSIS — E1122 Type 2 diabetes mellitus with diabetic chronic kidney disease: Secondary | ICD-10-CM

## 2016-10-31 DIAGNOSIS — I2510 Atherosclerotic heart disease of native coronary artery without angina pectoris: Secondary | ICD-10-CM | POA: Diagnosis not present

## 2016-10-31 DIAGNOSIS — Z79899 Other long term (current) drug therapy: Secondary | ICD-10-CM | POA: Diagnosis not present

## 2016-10-31 DIAGNOSIS — M50223 Other cervical disc displacement at C6-C7 level: Secondary | ICD-10-CM | POA: Diagnosis not present

## 2016-10-31 LAB — URINALYSIS, ROUTINE W REFLEX MICROSCOPIC
Bilirubin Urine: NEGATIVE
GLUCOSE, UA: NEGATIVE mg/dL
Hgb urine dipstick: NEGATIVE
KETONES UR: NEGATIVE mg/dL
LEUKOCYTES UA: NEGATIVE
Nitrite: NEGATIVE
PH: 7 (ref 5.0–8.0)
PROTEIN: 30 mg/dL — AB
RBC / HPF: NONE SEEN RBC/hpf (ref 0–5)
Specific Gravity, Urine: 1.009 (ref 1.005–1.030)

## 2016-10-31 LAB — GRAM STAIN

## 2016-10-31 LAB — BODY FLUID CELL COUNT WITH DIFFERENTIAL
EOS FL: 0 %
Lymphs, Fluid: 46 %
Monocyte-Macrophage-Serous Fluid: 27 % — ABNORMAL LOW (ref 50–90)
NEUTROPHIL FLUID: 27 % — AB (ref 0–25)
OTHER CELLS FL: 0 %
Total Nucleated Cell Count, Fluid: 313 cu mm (ref 0–1000)

## 2016-10-31 LAB — GLUCOSE, PLEURAL OR PERITONEAL FLUID: GLUCOSE FL: 136 mg/dL

## 2016-10-31 LAB — CBC WITH DIFFERENTIAL/PLATELET
Basophils Absolute: 0 10*3/uL (ref 0.0–0.1)
Basophils Relative: 0 %
EOS ABS: 0.1 10*3/uL (ref 0.0–0.7)
EOS PCT: 1 %
HCT: 31.4 % — ABNORMAL LOW (ref 36.0–46.0)
Hemoglobin: 9.7 g/dL — ABNORMAL LOW (ref 12.0–15.0)
LYMPHS ABS: 0.4 10*3/uL — AB (ref 0.7–4.0)
LYMPHS PCT: 6 %
MCH: 31.6 pg (ref 26.0–34.0)
MCHC: 30.9 g/dL (ref 30.0–36.0)
MCV: 102.3 fL — AB (ref 78.0–100.0)
MONO ABS: 0.7 10*3/uL (ref 0.1–1.0)
Monocytes Relative: 10 %
Neutro Abs: 5.7 10*3/uL (ref 1.7–7.7)
Neutrophils Relative %: 83 %
PLATELETS: 176 10*3/uL (ref 150–400)
RBC: 3.07 MIL/uL — ABNORMAL LOW (ref 3.87–5.11)
RDW: 15.7 % — AB (ref 11.5–15.5)
WBC: 6.8 10*3/uL (ref 4.0–10.5)

## 2016-10-31 LAB — COMPREHENSIVE METABOLIC PANEL
ALBUMIN: 3.9 g/dL (ref 3.5–5.0)
ALT: 23 U/L (ref 14–54)
AST: 26 U/L (ref 15–41)
Alkaline Phosphatase: 74 U/L (ref 38–126)
Anion gap: 8 (ref 5–15)
BILIRUBIN TOTAL: 0.5 mg/dL (ref 0.3–1.2)
BUN: 52 mg/dL — AB (ref 6–20)
CHLORIDE: 97 mmol/L — AB (ref 101–111)
CO2: 32 mmol/L (ref 22–32)
CREATININE: 2.06 mg/dL — AB (ref 0.44–1.00)
Calcium: 9.5 mg/dL (ref 8.9–10.3)
GFR calc Af Amer: 27 mL/min — ABNORMAL LOW (ref >60)
GFR, EST NON AFRICAN AMERICAN: 23 mL/min — AB (ref >60)
Glucose, Bld: 162 mg/dL — ABNORMAL HIGH (ref 65–99)
POTASSIUM: 4 mmol/L (ref 3.5–5.1)
Sodium: 137 mmol/L (ref 135–145)
Total Protein: 7.7 g/dL (ref 6.5–8.1)

## 2016-10-31 LAB — CBG MONITORING, ED: GLUCOSE-CAPILLARY: 136 mg/dL — AB (ref 65–99)

## 2016-10-31 LAB — AMYLASE, PLEURAL OR PERITONEAL FLUID: Amylase, Fluid: 26 U/L

## 2016-10-31 LAB — PROTEIN, PLEURAL OR PERITONEAL FLUID

## 2016-10-31 LAB — HEMOGLOBIN A1C
Hgb A1c MFr Bld: 8.8 % — ABNORMAL HIGH (ref 4.8–5.6)
Mean Plasma Glucose: 205.86 mg/dL

## 2016-10-31 LAB — I-STAT CG4 LACTIC ACID, ED
LACTIC ACID, VENOUS: 0.62 mmol/L (ref 0.5–1.9)
Lactic Acid, Venous: 0.65 mmol/L (ref 0.5–1.9)

## 2016-10-31 LAB — BRAIN NATRIURETIC PEPTIDE: B Natriuretic Peptide: 648 pg/mL — ABNORMAL HIGH (ref 0.0–100.0)

## 2016-10-31 LAB — ALBUMIN, PLEURAL OR PERITONEAL FLUID: Albumin, Fluid: 1.6 g/dL

## 2016-10-31 LAB — I-STAT TROPONIN, ED: TROPONIN I, POC: 0.03 ng/mL (ref 0.00–0.08)

## 2016-10-31 LAB — LACTATE DEHYDROGENASE, PLEURAL OR PERITONEAL FLUID: LD FL: 100 U/L

## 2016-10-31 LAB — GLUCOSE, CAPILLARY: Glucose-Capillary: 316 mg/dL — ABNORMAL HIGH (ref 65–99)

## 2016-10-31 MED ORDER — ONDANSETRON HCL 4 MG/2ML IJ SOLN: 4.0000 mg | Freq: Four times a day (QID) | INTRAMUSCULAR | Status: DC | PRN

## 2016-10-31 MED ORDER — VANCOMYCIN HCL IN DEXTROSE 1-5 GM/200ML-% IV SOLN
1000.0000 mg | INTRAVENOUS | Status: DC
Start: 2016-11-01 — End: 2016-10-31

## 2016-10-31 MED ORDER — FLUTICASONE PROPIONATE 50 MCG/ACT NA SUSP
1.0000 | Freq: Every day | NASAL | Status: DC
  Administered 2016-10-31 – 2016-11-01 (×2): 1 via NASAL
  Filled 2016-10-31: qty 16

## 2016-10-31 MED ORDER — SODIUM CHLORIDE 0.9% FLUSH: 3.0000 mL | INTRAVENOUS | Status: DC | PRN

## 2016-10-31 MED ORDER — HYDRALAZINE HCL 25 MG PO TABS
50.0000 mg | ORAL_TABLET | Freq: Three times a day (TID) | ORAL | Status: DC
Start: 2016-10-31 — End: 2016-11-01
  Administered 2016-10-31 – 2016-11-01 (×3): 50 mg via ORAL
  Filled 2016-10-31 (×3): qty 2

## 2016-10-31 MED ORDER — NITROGLYCERIN 0.4 MG SL SUBL
0.4000 mg | SUBLINGUAL_TABLET | SUBLINGUAL | Status: DC | PRN
Start: 2016-10-31 — End: 2016-11-01

## 2016-10-31 MED ORDER — ALBUTEROL SULFATE HFA 108 (90 BASE) MCG/ACT IN AERS: 1.0000 | INHALATION_SPRAY | Freq: Four times a day (QID) | RESPIRATORY_TRACT | Status: DC | PRN

## 2016-10-31 MED ORDER — ASPIRIN EC 81 MG PO TBEC
81.0000 mg | DELAYED_RELEASE_TABLET | Freq: Every day | ORAL | Status: DC
  Administered 2016-10-31 – 2016-11-01 (×2): 81 mg via ORAL
  Filled 2016-10-31 (×2): qty 1

## 2016-10-31 MED ORDER — MECLIZINE HCL 12.5 MG PO TABS
12.5000 mg | ORAL_TABLET | Freq: Two times a day (BID) | ORAL | Status: DC
  Administered 2016-10-31 – 2016-11-01 (×2): 12.5 mg via ORAL
  Filled 2016-10-31 (×2): qty 1

## 2016-10-31 MED ORDER — ADULT MULTIVITAMIN W/MINERALS CH
1.0000 | ORAL_TABLET | Freq: Every day | ORAL | Status: DC
  Administered 2016-10-31 – 2016-11-01 (×2): 1 via ORAL
  Filled 2016-10-31 (×2): qty 1

## 2016-10-31 MED ORDER — INSULIN DEGLUDEC 100 UNIT/ML ~~LOC~~ SOPN: 40.0000 [IU] | PEN_INJECTOR | Freq: Every day | SUBCUTANEOUS | Status: DC

## 2016-10-31 MED ORDER — DEXTROSE 5 % IV SOLN
1.0000 g | Freq: Three times a day (TID) | INTRAVENOUS | Status: DC
  Administered 2016-10-31 – 2016-11-01 (×2): 1 g via INTRAVENOUS
  Filled 2016-10-31 (×6): qty 1

## 2016-10-31 MED ORDER — ONDANSETRON HCL 4 MG PO TABS: 4.0000 mg | ORAL_TABLET | Freq: Four times a day (QID) | ORAL | Status: DC | PRN

## 2016-10-31 MED ORDER — ALBUTEROL SULFATE (2.5 MG/3ML) 0.083% IN NEBU: 2.5000 mg | INHALATION_SOLUTION | Freq: Four times a day (QID) | RESPIRATORY_TRACT | Status: DC | PRN

## 2016-10-31 MED ORDER — SODIUM CHLORIDE 0.9 % IV SOLN: 250.0000 mL | INTRAVENOUS | Status: DC | PRN

## 2016-10-31 MED ORDER — ISOSORBIDE MONONITRATE ER 60 MG PO TB24
60.0000 mg | ORAL_TABLET | Freq: Every day | ORAL | Status: DC
  Administered 2016-11-01: 60 mg via ORAL
  Filled 2016-10-31: qty 1

## 2016-10-31 MED ORDER — LATANOPROST 0.005 % OP SOLN
1.0000 [drp] | Freq: Every day | OPHTHALMIC | Status: DC
Start: 2016-10-31 — End: 2016-11-01
  Administered 2016-10-31: 1 [drp] via OPHTHALMIC
  Filled 2016-10-31: qty 2.5

## 2016-10-31 MED ORDER — RANOLAZINE ER 500 MG PO TB12
500.0000 mg | ORAL_TABLET | Freq: Two times a day (BID) | ORAL | Status: DC
  Administered 2016-10-31 – 2016-11-01 (×2): 500 mg via ORAL
  Filled 2016-10-31 (×2): qty 1

## 2016-10-31 MED ORDER — SODIUM CHLORIDE 0.9% FLUSH
3.0000 mL | Freq: Two times a day (BID) | INTRAVENOUS | Status: DC
  Administered 2016-10-31 – 2016-11-01 (×2): 3 mL via INTRAVENOUS

## 2016-10-31 MED ORDER — INSULIN ASPART 100 UNIT/ML ~~LOC~~ SOLN
0.0000 [IU] | Freq: Every day | SUBCUTANEOUS | Status: DC
Start: 2016-10-31 — End: 2016-11-01
  Administered 2016-10-31: 4 [IU] via SUBCUTANEOUS

## 2016-10-31 MED ORDER — FUROSEMIDE 10 MG/ML IJ SOLN
80.0000 mg | Freq: Once | INTRAMUSCULAR | Status: AC
  Administered 2016-10-31: 80 mg via INTRAVENOUS
  Filled 2016-10-31: qty 8

## 2016-10-31 MED ORDER — INSULIN GLARGINE 100 UNIT/ML ~~LOC~~ SOLN
40.0000 [IU] | Freq: Every day | SUBCUTANEOUS | Status: DC
  Administered 2016-10-31: 40 [IU] via SUBCUTANEOUS
  Filled 2016-10-31 (×2): qty 0.4

## 2016-10-31 MED ORDER — ROSUVASTATIN CALCIUM 20 MG PO TABS
40.0000 mg | ORAL_TABLET | Freq: Every day | ORAL | Status: DC
  Administered 2016-10-31: 40 mg via ORAL
  Filled 2016-10-31: qty 2

## 2016-10-31 MED ORDER — CYCLOBENZAPRINE HCL 10 MG PO TABS
5.0000 mg | ORAL_TABLET | Freq: Every day | ORAL | Status: DC
Start: 2016-10-31 — End: 2016-11-01
  Administered 2016-10-31: 5 mg via ORAL
  Filled 2016-10-31: qty 1

## 2016-10-31 MED ORDER — ACETAMINOPHEN 650 MG RE SUPP: 650.0000 mg | Freq: Four times a day (QID) | RECTAL | Status: DC | PRN

## 2016-10-31 MED ORDER — DEXTROSE 5 % IV SOLN
2.0000 g | Freq: Once | INTRAVENOUS | Status: AC
  Administered 2016-10-31: 2 g via INTRAVENOUS
  Filled 2016-10-31: qty 2

## 2016-10-31 MED ORDER — METOPROLOL SUCCINATE ER 25 MG PO TB24
25.0000 mg | ORAL_TABLET | Freq: Two times a day (BID) | ORAL | Status: DC
  Administered 2016-10-31 – 2016-11-01 (×2): 25 mg via ORAL
  Filled 2016-10-31 (×2): qty 1

## 2016-10-31 MED ORDER — SENNOSIDES-DOCUSATE SODIUM 8.6-50 MG PO TABS: 1.0000 | ORAL_TABLET | Freq: Every evening | ORAL | Status: DC | PRN

## 2016-10-31 MED ORDER — POTASSIUM CHLORIDE CRYS ER 20 MEQ PO TBCR
40.0000 meq | EXTENDED_RELEASE_TABLET | Freq: Every day | ORAL | Status: DC
  Administered 2016-10-31 – 2016-11-01 (×2): 40 meq via ORAL
  Filled 2016-10-31 (×2): qty 2

## 2016-10-31 MED ORDER — DIAZEPAM 5 MG PO TABS
5.0000 mg | ORAL_TABLET | Freq: Two times a day (BID) | ORAL | Status: DC | PRN
  Administered 2016-10-31: 5 mg via ORAL
  Filled 2016-10-31: qty 1

## 2016-10-31 MED ORDER — INSULIN ASPART 100 UNIT/ML ~~LOC~~ SOLN
0.0000 [IU] | Freq: Three times a day (TID) | SUBCUTANEOUS | Status: DC
  Administered 2016-11-01 (×2): 2 [IU] via SUBCUTANEOUS

## 2016-10-31 MED ORDER — ACETAMINOPHEN 325 MG PO TABS
650.0000 mg | ORAL_TABLET | Freq: Four times a day (QID) | ORAL | Status: DC | PRN
  Administered 2016-10-31: 650 mg via ORAL
  Filled 2016-10-31: qty 2

## 2016-10-31 MED ORDER — INSULIN ASPART 100 UNIT/ML ~~LOC~~ SOLN
4.0000 [IU] | Freq: Three times a day (TID) | SUBCUTANEOUS | Status: DC
  Administered 2016-11-01 (×2): 4 [IU] via SUBCUTANEOUS

## 2016-10-31 MED ORDER — ENOXAPARIN SODIUM 40 MG/0.4ML ~~LOC~~ SOLN
40.0000 mg | SUBCUTANEOUS | Status: DC
  Administered 2016-10-31: 40 mg via SUBCUTANEOUS
  Filled 2016-10-31: qty 0.4

## 2016-10-31 MED ORDER — PANTOPRAZOLE SODIUM 40 MG PO TBEC
40.0000 mg | DELAYED_RELEASE_TABLET | Freq: Every day | ORAL | Status: DC
Start: 2016-10-31 — End: 2016-11-01
  Administered 2016-10-31 – 2016-11-01 (×2): 40 mg via ORAL
  Filled 2016-10-31 (×2): qty 1

## 2016-10-31 MED ORDER — TORSEMIDE 20 MG PO TABS
60.0000 mg | ORAL_TABLET | Freq: Two times a day (BID) | ORAL | Status: DC
  Administered 2016-10-31 – 2016-11-01 (×2): 60 mg via ORAL
  Filled 2016-10-31 (×2): qty 3

## 2016-10-31 MED ORDER — TICAGRELOR 90 MG PO TABS
90.0000 mg | ORAL_TABLET | Freq: Two times a day (BID) | ORAL | Status: DC
  Administered 2016-10-31 – 2016-11-01 (×2): 90 mg via ORAL
  Filled 2016-10-31 (×2): qty 1

## 2016-10-31 MED ORDER — FERROUS SULFATE 325 (65 FE) MG PO TABS
325.0000 mg | ORAL_TABLET | Freq: Every day | ORAL | Status: DC
  Administered 2016-11-01: 325 mg via ORAL
  Filled 2016-10-31: qty 1

## 2016-10-31 MED ORDER — VANCOMYCIN HCL 10 G IV SOLR
1500.0000 mg | Freq: Once | INTRAVENOUS | Status: AC
  Administered 2016-10-31: 1500 mg via INTRAVENOUS
  Filled 2016-10-31: qty 1500

## 2016-10-31 MED ORDER — VANCOMYCIN HCL IN DEXTROSE 1-5 GM/200ML-% IV SOLN
1000.0000 mg | INTRAVENOUS | Status: DC
  Administered 2016-11-01: 1000 mg via INTRAVENOUS
  Filled 2016-10-31: qty 200

## 2016-10-31 NOTE — ED Provider Notes (Signed)
[facility] DEPT Provider Note   CSN: [id number] Arrival date & time: 10/31/16  0430     History   Chief Complaint Chief Complaint  Patient presents with  . Chest Pain    HPI [name] is a 71 y.o. female.  Patient presents to the emergency department for evaluation of chest pain. Patient reports that she woke up around 2 AM to go to the bathroom and noticed that she had chest pain. Patient reports a pain in the center of her chest that occurs when she takes a deep breath. She has mild shortness of breath. Patient reports that she has had a cough over the last few days.       Past Medical History:  Diagnosis Date  . Acute renal failure superimposed on stage 4 chronic kidney disease ([place]) 07/04/2015  . Allergic rhinitis   . Anemia   . Anxiety   . CAD in native artery    NSTEMI 07/2015 s/p DES to RCA and posterior PDA, PCI 10/2015 with scoring balloon to 85% ISR of distal RCA), known LAD/Cx disease treated medically  . Carotid artery disease ([place])    Mild bilateral carotid disease (1-39% 07/2015)  . Chronic combined systolic and diastolic CHF (congestive heart failure) ([place])   . CKD (chronic kidney disease), stage IV ([place])   . Constipation   . Diabetes mellitus   . GERD (gastroesophageal reflux disease)   . Heart murmur, systolic   . History of hysterectomy   . Hyperlipidemia   . Hypertension   . Low back pain   . Obesity   . 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 Osteoarthritis   . Overactive bladder   . PVC's (premature ventricular contractions)     Patient Active Problem List   Diagnosis Date Noted  . Pleural effusion on right 10/31/2016  . HCAP (healthcare-associated pneumonia) 10/31/2016  . DM type 2 causing vascular disease ([place]) 10/05/2016  . Personal history of noncompliance with medical treatment, presenting hazards to health 08/22/2016  . Hyperglycemia  08/07/2016  . Pulmonary edema 07/14/2016  . CKD (chronic kidney disease), stage IV ([place]) 07/14/2016  . Acute respiratory failure with hypoxia ([place]) 07/09/2016  . Acute on chronic systolic CHF (congestive heart failure) ([place]) 12/01/2015  . AKI (acute kidney injury) ([place])   . Ischemia of upper extremity   . Right knee pain   . Subclavian artery stenosis, right ([place]) 07/07/2015  . Acute on chronic combined systolic and diastolic CHF (congestive heart failure) ([place]) 07/04/2015  . Elevated troponin 07/04/2015  . NSTEMI (non-ST elevated myocardial infarction) ([place]) 07/04/2015  . Elevated d-dimer 07/04/2015  . Acute respiratory failure with hypercapnia ([place])   . Bilateral lower extremity edema 01/20/2012  . Type 2 diabetes mellitus with stage 4 chronic kidney disease, with long-term current use of insulin ([place]) 01/21/2011  . Overweight 07/20/2009  . Atherosclerosis of native coronary artery of native heart with unstable angina pectoris ([place]) 10/08/2008  . HEART MURMUR, SYSTOLIC 89/21/1942  . SHOULDER PAIN 02/05/2007  . Hyperlipemia 04/11/2006  . ANXIETY 04/11/2006  . SYNDROME, CARPAL TUNNEL 04/11/2006  . Essential hypertension 04/11/2006  . ALLERGIC RHINITIS 04/11/2006  . GERD 04/11/2006  . Constipation 04/11/2006  . OVERACTIVE BLADDER 04/11/2006  . OSTEOARTHRITIS 04/11/2006  . LOW BACK PAIN 04/11/2006    Past Surgical History:  Procedure Laterality Date  . ABDOMINAL HYSTERECTOMY    . BACK SURGERY     multiple  .  CARDIAC CATHETERIZATION  04/04/2006   Est EF of 60%  . CARDIAC CATHETERIZATION N/A 07/04/2015   Procedure: Left Heart Cath and Coronary Angiography;  Surgeon: Troy Sine, MD;  Location: [facility];  Service: Cardiovascular;  Laterality: N/A;  . CARDIAC CATHETERIZATION N/A 07/04/2015   Procedure: Coronary Stent Intervention;  Surgeon: Troy Sine, MD;  Location: [facility];  Service: Cardiovascular;  Laterality: N/A;  . CARDIAC CATHETERIZATION N/A  10/13/2015   Procedure: Left Heart Cath and Coronary Angiography;  Surgeon: Troy Sine, MD;  Location: [facility];  Service: Cardiovascular;  Laterality: N/A;  . CARDIAC CATHETERIZATION N/A 12/02/2015   Procedure: Left Heart Cath and Coronary Angiography;  Surgeon: Peter M Martinique, MD;  Location: [facility];  Service: Cardiovascular;  Laterality: N/A;  . CARPAL TUNNEL RELEASE    . CERVICAL BIOPSY     cervical lymph node biopsies  . COLONOSCOPY  May 2002   Dr. Irving Shows :Followup in 5 years, normal exam  . COLONOSCOPY  2008   Dr. Laural Golden: Very redundant colon with mild melanosis coli, splenic flexure polyp biopsy with acute complaint of benign colon polyp. Recommended ten-year followup  . CORONARY STENT PLACEMENT  04/11/2006   2 -- Taxus stents to the circumflex   . PERIPHERAL VASCULAR CATHETERIZATION Right 07/09/2015   Procedure: Upper Extremity Angiography;  Surgeon: Conrad , MD;  Location: [facility];  Service: Cardiovascular;  Laterality: Right;  . PERIPHERAL VASCULAR CATHETERIZATION Right 07/10/2015   Procedure: RIGHT SUBCLAVIAN ARTERY THROMBECTOMY;  Surgeon: Serafina Mitchell, MD;  Location: [facility];  Service: Vascular;  Laterality: Right;  . tendonitis     bilateral elbow  . TRIGGER FINGER RELEASE      OB History    No data available       Home Medications    Prior to Admission medications   Medication Sig Start Date End Date Taking? Authorizing Provider  acetaminophen (TYLENOL) 500 MG tablet Take 2 tablets (1,000 mg total) by mouth every 6 (six) hours as needed (pain). 07/12/15  Yes Lily Kocher, MD  albuterol (PROVENTIL HFA;VENTOLIN HFA) 108 (90 Base) MCG/ACT inhaler Inhale 1-2 puffs into the lungs every 6 (six) hours as needed for wheezing or shortness of breath. 04/01/16  Yes Nanavati, Ankit, MD  albuterol (PROVENTIL) (2.5 MG/3ML) 0.083% nebulizer solution Take 3 mLs (2.5 mg total) by nebulization every 6 (six) hours as needed for wheezing or shortness  of breath. 07/16/16  Yes Elgergawy, Silver Huguenin, MD  aspirin EC 81 MG tablet Take 81 mg by mouth daily.   Yes [provider]  Continuous Blood Gluc Sensor ([place]) MISC Use one sensor every 10 days for continuous glucose monitoring. 10/05/16  Yes Nida, Marella Chimes, MD  cyclobenzaprine (FLEXERIL) 5 MG tablet Take 5 mg by mouth at bedtime. 10/03/16  Yes [provider]  ferrous sulfate 325 (65 FE) MG tablet Take 325 mg by mouth daily with breakfast.    Yes [provider]  fluticasone (FLONASE) 50 MCG/ACT nasal spray Place 1 spray into both nostrils daily.   Yes [provider]  hydrALAZINE (APRESOLINE) 25 MG tablet Take 2 tablets (50 mg total) by mouth 3 (three) times daily. 01/21/16  Yes Herminio Commons, MD  HYDROcodone-homatropine (HYCODAN) 5-1.5 MG/5ML syrup Take 5 mLs by mouth every 6 (six) hours as needed for cough.  08/30/16  Yes [provider]  insulin degludec (TRESIBA) 100 UNIT/ML SOPN FlexTouch  Pen Inject 40 Units into the skin at bedtime.    Yes [provider]  insulin lispro (HUMALOG KWIKPEN) 100 UNIT/ML KiwkPen INJECT 18 TO 24 UNITS TOTAL INTO THE SKIN THREE TIMES DAILY 10/05/16  Yes Nida, Marella Chimes, MD  isosorbide mononitrate (IMDUR) 60 MG 24 hr tablet Take 1 tablet (60 mg total) by mouth daily. 07/12/15  Yes Lily Kocher, MD  latanoprost (XALATAN) 0.005 % ophthalmic solution Place 1 drop into both eyes at bedtime.    Yes [provider]  meclizine (ANTIVERT) 25 MG tablet Take 12.5 mg by mouth 2 (two) times daily.    Yes [provider]  metoprolol succinate (TOPROL-XL) 50 MG 24 hr tablet TAKE ONE TABLET BY MOUTH TWICE DAILY WITH  OR  IMMEDIATELY  FOLLOWING  A  MEAL 04/28/16  Yes Lyda Jester M, PA-C  Multiple Vitamin (MULTIVITAMIN WITH MINERALS) TABS tablet Take 1 tablet by mouth daily.   Yes [provider]  nitroGLYCERIN (NITROSTAT) 0.4 MG SL tablet Place 1 tablet (0.4 mg  total) under the tongue every 5 (five) minutes as needed for chest pain. 10/14/15  Yes Simmons, Brittainy M, PA-C  pantoprazole (PROTONIX) 40 MG tablet Take 40 mg by mouth daily.    Yes [provider]  potassium chloride SA (K-DUR,KLOR-CON) 20 MEQ tablet Take 2 tablets (40 mEq total) by mouth daily. 10/14/15  Yes Simmons, Brittainy M, PA-C  RANEXA 500 MG 12 hr tablet TAKE ONE TABLET BY MOUTH TWICE DAILY 05/04/16  Yes Troy Sine, MD  rosuvastatin (CRESTOR) 40 MG tablet Take 1 tablet (40 mg total) by mouth at bedtime. 07/12/15  Yes Lily Kocher, MD  ticagrelor (BRILINTA) 90 MG TABS tablet Take 1 tablet (90 mg total) by mouth 2 (two) times daily. 10/14/15  Yes Rosita Fire, Brittainy M, PA-C  torsemide (DEMADEX) 20 MG tablet Take 60 mg by mouth 2 (two) times daily. Pt is able to take an additional tablet daily for weight gain greater than 3lbs.   Yes [provider]  trolamine salicylate (ASPERCREME) 10 % cream Apply 1 application topically as needed for muscle pain.   Yes [provider]  diazepam (VALIUM) 5 MG tablet Take 1 tablet (5 mg total) by mouth every 12 (twelve) hours as needed for muscle spasms. 10/29/16   Julianne Rice, MD    Family History Family History  Problem Relation Age of Onset  . Diabetes Father   . Hypertension Father   . Stroke Father   . Hypertension Brother   . Aneurysm Brother   . Diabetes Brother   . Colon cancer Neg Hx     Social History Social History  Substance Use Topics  . Smoking status: Never Smoker  . Smokeless tobacco: Never Used  . Alcohol use No     Allergies   Ace inhibitors; Amlodipine; and Codeine   Review of Systems Review of Systems  Respiratory: Positive for cough and shortness of breath.   Cardiovascular: Positive for chest pain.  All other systems reviewed and are negative.    Physical Exam Updated Vital Signs BP (!) 147/67 (BP Location: Right Arm)   Pulse 65   Temp 98.5 F (36.9 C) (Oral)   Resp 18    Ht 5\' 8"  (1.727 m)   Wt 91.4 kg (201 lb 8 oz)   SpO2 98%   BMI 30.64 kg/m   Physical Exam  Constitutional: She is oriented to person, place, and time. She appears well-developed and well-nourished. No distress.  HENT:  Head: Normocephalic and atraumatic.  Right Ear: Hearing normal.  Left Ear: Hearing normal.  Nose: Nose normal.  Mouth/Throat: Oropharynx is clear and moist and mucous membranes are normal.  Eyes: Pupils are equal, round, and reactive to light. Conjunctivae and EOM are normal.  Neck: Normal range of motion. Neck supple.  Cardiovascular: Regular rhythm, S1 normal and S2 normal.  Exam reveals no gallop and no friction rub.   No murmur heard. Pulmonary/Chest: Effort normal and breath sounds normal. No respiratory distress. She exhibits no tenderness.  Abdominal: Soft. Normal appearance and bowel sounds are normal. There is no hepatosplenomegaly. There is no tenderness. There is no rebound, no guarding, no tenderness at McBurney's point and negative Murphy's sign. No hernia.  Musculoskeletal: Normal range of motion.  Neurological: She is alert and oriented to person, place, and time. She has normal strength. No cranial nerve deficit or sensory deficit. Coordination normal. GCS eye subscore is 4. GCS verbal subscore is 5. GCS motor subscore is 6.  Skin: Skin is warm, dry and intact. No rash noted. No cyanosis.  Psychiatric: She has a normal mood and affect. Her speech is normal and behavior is normal. Thought content normal.  Nursing note and vitals reviewed.    ED Treatments / Results  Labs (all labs ordered are listed, but only abnormal results are displayed) Labs Reviewed  COMPREHENSIVE METABOLIC PANEL - Abnormal; Notable for the following:       Result Value   Chloride 97 (*)    Glucose, Bld 162 (*)    BUN 52 (*)    Creatinine, Ser 2.06 (*)    GFR calc non Af Amer 23 (*)    GFR calc Af Amer 27 (*)    All other components within normal limits  CBC WITH  DIFFERENTIAL/PLATELET - Abnormal; Notable for the following:    RBC 3.07 (*)    Hemoglobin 9.7 (*)    HCT 31.4 (*)    MCV 102.3 (*)    RDW 15.7 (*)    Lymphs Abs 0.4 (*)    All other components within normal limits  URINALYSIS, ROUTINE W REFLEX MICROSCOPIC - Abnormal; Notable for the following:    Protein, ur 30 (*)    Bacteria, UA RARE (*)    Squamous Epithelial / LPF 0-5 (*)    All other components within normal limits  BRAIN NATRIURETIC PEPTIDE - Abnormal; Notable for the following:    B Natriuretic Peptide 648.0 (*)    All other components within normal limits  BODY FLUID CELL COUNT WITH DIFFERENTIAL - Abnormal; Notable for the following:    Appearance, Fluid CLOUDY (*)    Neutrophil Count, Fluid 27 (*)    Monocyte-Macrophage-Serous Fluid 27 (*)    All other components within normal limits  HEMOGLOBIN A1C - Abnormal; Notable for the following:    Hgb A1c MFr Bld 8.8 (*)    All other components within normal limits  GLUCOSE, CAPILLARY - Abnormal; Notable for the following:    Glucose-Capillary 316 (*)    All other components within normal limits  CBG MONITORING, ED - Abnormal; Notable for the following:    Glucose-Capillary 136 (*)    All other components within normal limits  CULTURE, BLOOD (ROUTINE X 2)  CULTURE, BLOOD (ROUTINE X 2)  GRAM STAIN  CULTURE, EXPECTORATED SPUTUM-ASSESSMENT  URINE CULTURE  CULTURE, BODY FLUID-BOTTLE  CULTURE, RESPIRATORY (NON-EXPECTORATED)  LACTATE DEHYDROGENASE, PLEURAL OR PERITONEAL FLUID  AMYLASE, PLEURAL OR PERITONEAL FLUID   ALBUMIN, PLEURAL OR PERITONEAL  FLUID  PROTEIN, PLEURAL OR PERITONEAL FLUID  GLUCOSE, PLEURAL OR PERITONEAL FLUID  HIV ANTIBODY (ROUTINE TESTING)  PH, BODY FLUID  STREP PNEUMONIAE URINARY ANTIGEN  BASIC METABOLIC PANEL  CBC  LEGIONELLA PNEUMOPHILA SEROGP 1 UR AG  I-STAT CG4 LACTIC ACID, ED  I-STAT TROPONIN, ED  I-STAT CG4 LACTIC ACID, ED  CYTOLOGY - NON PAP    EKG  EKG  Interpretation  Date/Time:  Monday October 31 2016 04:36:47 EDT Ventricular Rate:  72 PR Interval:    QRS Duration: 116 QT Interval:  461 QTC Calculation: 505 R Axis:   28 Text Interpretation:  Sinus rhythm Nonspecific intraventricular conduction delay Low voltage, extremity and precordial leads Nonspecific T abnormalities, inferior leads Baseline wander in lead(s) V6 No significant change since last tracing Confirmed by Orpah Greek [id number]) on 10/31/2016 5:07:58 AM       Radiology Dg Chest 1 View  Result Date: 10/31/2016 CLINICAL DATA:  Status post right thoracentesis. EXAM: CHEST 1 VIEW COMPARISON:  10/31/2016. FINDINGS: Stable enlarged cardiac silhouette. Minimal left lower lobe atelectasis with improvement. Minimal bilateral pleural fluid, mildly improved on the left and significantly improved on the right following thoracentesis. No pneumothorax. Thoracic spine degenerative changes. IMPRESSION: 1. Almost completely resolved right pleural fluid following thoracentesis without pneumothorax. 2. Small left pleural effusion with improvement with decreased left basilar atelectasis. Electronically Signed   By: Claudie Revering M.D.   On: 10/31/2016 13:30   Dg Chest 2 View  Result Date: 10/31/2016 CLINICAL DATA:  Chest pain and shortness of breath.  Cough. EXAM: CHEST  2 VIEW COMPARISON:  08/07/2016 FINDINGS: There is cardiomegaly. Bilateral pleural effusions, moderate on the right and small on the left. There is vascular congestion and peribronchial cuffing suggesting pulmonary edema. No pneumothorax. IMPRESSION: Cardiomegaly with bilateral pleural effusions and vascular congestion. Peribronchial cuffing may reflect pulmonary edema or bronchial inflammation. Electronically Signed   By: Jeb Levering M.D.   On: 10/31/2016 06:11   Ct Chest Wo Contrast  Result Date: 10/31/2016 CLINICAL DATA:  Shortness of breath with exertion EXAM: CT CHEST WITHOUT CONTRAST TECHNIQUE: Multidetector CT  imaging of the chest was performed following the standard protocol without IV contrast. COMPARISON:  Chest x-ray 10/31/2016 FINDINGS: Cardiovascular: Aortic and coronary artery calcifications. No aneurysm. Mild cardiomegaly. Mediastinum/Nodes: Scattered borderline and mildly enlarged mediastinal lymph nodes. Index AP window lymph node has a short axis diameter of 18 mm. Index right paratracheal node has a short axis diameter of 9 mm. No axillary adenopathy. Lungs/Pleura: Large right pleural effusion and small left pleural effusion. Bibasilar atelectasis. Mild vascular congestion. Upper Abdomen: Imaging into the upper abdomen shows no acute findings. Musculoskeletal: Chest wall soft tissues are unremarkable. No acute bony abnormality. IMPRESSION: Large right pleural effusion and small left effusion. Bibasilar atelectasis. Vascular congestion. Extensive coronary artery calcifications. Mildly enlarged mediastinal lymph nodes, likely reactive. Aortic Atherosclerosis (ICD10-I70.0). Electronically Signed   By: Rolm Baptise M.D.   On: 10/31/2016 07:08   US Thoracentesis Asp Pleural Space W/img Guide  Result Date: 10/31/2016 INDICATION: Right pleural effusion. EXAM: ULTRASOUND GUIDED RIGHT THORACENTESIS MEDICATIONS: None. COMPLICATIONS: None immediate. PROCEDURE: An ultrasound guided thoracentesis was thoroughly discussed with the patient and questions answered. The benefits, risks, alternatives and complications were also discussed. The patient understands and wishes to proceed with the procedure. Written consent was obtained. Ultrasound was performed to localize and mark an adequate pocket of fluid in the right chest. The area was then prepped and draped in the normal sterile fashion. 1% Lidocaine was  used for local anesthesia. Under ultrasound guidance a Yuen catheter was introduced. Thoracentesis was performed. The catheter was removed and a dressing applied. FINDINGS: A total of approximately 1 L of blood-tinged  fluid was removed. Samples were sent to the laboratory as requested by the clinical team. IMPRESSION: Successful ultrasound guided right thoracentesis yielding 1 L of pleural fluid. The patient tolerated the procedure well. No immediate complications. Electronically Signed   By: Lorriane Shire M.D.   On: 10/31/2016 13:09    Procedures Procedures (including critical care time)  Medications Ordered in ED Medications  cyclobenzaprine (FLEXERIL) tablet 5 mg (5 mg Oral Given 10/31/16 2222)  diazepam (VALIUM) tablet 5 mg (5 mg Oral Given 10/31/16 1817)  fluticasone (FLONASE) 50 MCG/ACT nasal spray 1 spray (1 spray Each Nare Given 10/31/16 1801)  torsemide (DEMADEX) tablet 60 mg (60 mg Oral Given 10/31/16 2222)  albuterol (PROVENTIL) (2.5 MG/3ML) 0.083% nebulizer solution 2.5 mg (not administered)  multivitamin with minerals tablet 1 tablet (1 tablet Oral Given 10/31/16 1802)  pantoprazole (PROTONIX) EC tablet 40 mg (40 mg Oral Given 10/31/16 1802)  ranolazine (RANEXA) 12 hr tablet 500 mg (500 mg Oral Given 10/31/16 2222)  metoprolol succinate (TOPROL-XL) 24 hr tablet 25 mg (25 mg Oral Given 10/31/16 2222)  meclizine (ANTIVERT) tablet 12.5 mg (12.5 mg Oral Given 10/31/16 2222)  hydrALAZINE (APRESOLINE) tablet 50 mg (50 mg Oral Given 10/31/16 2221)  nitroGLYCERIN (NITROSTAT) SL tablet 0.4 mg (not administered)  potassium chloride SA (K-DUR,KLOR-CON) CR tablet 40 mEq (40 mEq Oral Given 10/31/16 1802)  ticagrelor (BRILINTA) tablet 90 mg (90 mg Oral Given 10/31/16 2222)  isosorbide mononitrate (IMDUR) 24 hr tablet 60 mg (not administered)  rosuvastatin (CRESTOR) tablet 40 mg (40 mg Oral Given 10/31/16 2221)  ferrous sulfate tablet 325 mg (not administered)  aspirin EC tablet 81 mg (81 mg Oral Given 10/31/16 1802)  latanoprost (XALATAN) 0.005 % ophthalmic solution 1 drop (1 drop Both Eyes Given 10/31/16 2222)  enoxaparin (LOVENOX) injection 40 mg (40 mg Subcutaneous Given 10/31/16 1801)  sodium chloride flush (NS)  0.9 % injection 3 mL (3 mLs Intravenous Given 10/31/16 2214)  sodium chloride flush (NS) 0.9 % injection 3 mL (not administered)  0.9 %  sodium chloride infusion (not administered)  acetaminophen (TYLENOL) tablet 650 mg (650 mg Oral Given 10/31/16 2303)    Or  acetaminophen (TYLENOL) suppository 650 mg ( Rectal See Alternative 10/31/16 2303)  senna-docusate (Senokot-S) tablet 1 tablet (not administered)  ondansetron (ZOFRAN) tablet 4 mg (not administered)    Or  ondansetron (ZOFRAN) injection 4 mg (not administered)  ceFEPIme (MAXIPIME) 1 g in dextrose 5 % 50 mL IVPB (0 g Intravenous Stopped 11/01/16 0229)  insulin aspart (novoLOG) injection 0-15 Units (not administered)  insulin aspart (novoLOG) injection 0-5 Units (4 Units Subcutaneous Given 10/31/16 2221)  insulin aspart (novoLOG) injection 4 Units (not administered)  vancomycin (VANCOCIN) IVPB 1000 mg/200 mL premix (1,000 mg Intravenous New Bag/Given 11/01/16 0631)  insulin glargine (LANTUS) injection 40 Units (40 Units Subcutaneous Given 10/31/16 2242)  furosemide (LASIX) injection 80 mg (80 mg Intravenous Given 10/31/16 0745)  ceFEPIme (MAXIPIME) 2 g in dextrose 5 % 50 mL IVPB (0 g Intravenous Stopped 10/31/16 0903)  vancomycin (VANCOCIN) 1,500 mg in sodium chloride 0.9 % 500 mL IVPB (0 mg Intravenous Stopped 10/31/16 1403)     Initial Impression / Assessment and Plan / ED Course  I have reviewed the triage vital signs and the nursing notes.  Pertinent labs & imaging results that  were available during my care of the patient were reviewed by me and considered in my medical decision making (see chart for details).     Patient presents to the emergency department for evaluation of chest pain. Patient reports that she has been experiencing some cough and chest congestion, now experiencing pain in the center of her chest. Pain worsens with taking a deep breath. She was noted to have a low-grade fever. X-ray consistent with bilateral pleural  effusion, right greater than left. Etiology unclear. CT scan confirms effusion. No associated pneumonia.  Patient with history of congestive heart failure. Reviewing previous x-rays does reveal some element of effusion associated with pulmonary edema and volume overload in the past. Will recommend hospitalization for diuresis to ensure resolution of effusions.  Final Clinical Impressions(s) / ED Diagnoses   Final diagnoses:  Pleural effusion    New Prescriptions Current Discharge Medication List       Orpah Greek, MD 11/01/16 [phone]

## 2016-10-31 NOTE — ED Notes (Signed)
Patient transported to CT 

## 2016-10-31 NOTE — ED Triage Notes (Signed)
Pt c/o mid center chest pain that is worse with breathing, does have shooting pain in left arm as well that woke her up from sleep tonight,

## 2016-10-31 NOTE — ED Notes (Signed)
Patient called to go restroom. Patient helped to restroom with standby assist. Patient unsteady gait, asked patient if she felt dizzy or lightheaded she stated that she was neither one, but she was short winded. Patient not able to void at this time. States that she has an overactive bladder and believes she urinated in her brief upon walking to restroom.

## 2016-10-31 NOTE — ED Notes (Signed)
Tori with lab notified of need for blood draw and cultures, unable to draw back from 22g IV access.

## 2016-10-31 NOTE — ED Notes (Signed)
Blood cultures drawn x 2 by lab 

## 2016-10-31 NOTE — H&P (Signed)
History and Physical    [name] IDP:[id number] DOB: 16-Oct-1945 DOA: 10/31/2016  Referring MD/NP/PA: Joseph Berkshire, EDP PCP: [name], MD  Patient coming from: Home  Chief Complaint: Chest pain, fever, cough  HPI: [name] is a 71 y.o. female with multiple medical comorbidities including coronary artery disease, subclavian stenosis status post thromboembolectomy, stage IV kidney disease, hypertension, diabetes among other issues presents to the hospital today with about a 4 day history of right-sided pleuritic chest pain with nonproductive cough. She was hospitalized in June 2018 with congestive heart failure exacerbation. There was also concern at that time of a right lower lobe infiltrate. She had a low-grade temperature 100.4, she does not have a current oxygen requirement, labs are normal with the exception of a creatinine of 2.06 which is her baseline, chest x-ray shows cardiomegaly with bilateral pleural effusions and vascular congestion. CT chest without contrast with a large right pleural effusion and a small left effusion. There are some mildly enlarged mediastinal lymph nodes which are likely reactive. Discussed plan of care with EDP, we have decided to perform a right-sided thoracentesis and treat for hospital-acquired pneumonia. Admission requested.  Past Medical/Surgical History: Past Medical History:  Diagnosis Date  . Acute renal failure superimposed on stage 4 chronic kidney disease ([place]) 07/04/2015  . Allergic rhinitis   . Anemia   . Anxiety   . CAD in native artery    NSTEMI 07/2015 s/p DES to RCA and posterior PDA, PCI 10/2015 with scoring balloon to 85% ISR of distal RCA), known LAD/Cx disease treated medically  . Carotid artery disease ([place])    Mild bilateral carotid disease (1-39% 07/2015)  . Chronic combined systolic and diastolic CHF (congestive heart failure) ([place])   . CKD (chronic kidney disease), stage IV ([place])   . Constipation   .  Diabetes mellitus   . GERD (gastroesophageal reflux disease)   . Heart murmur, systolic   . History of hysterectomy   . Hyperlipidemia   . Hypertension   . Low back pain   . Obesity   . 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 Osteoarthritis   . Overactive bladder   . PVC's (premature ventricular contractions)     Past Surgical History:  Procedure Laterality Date  . ABDOMINAL HYSTERECTOMY    . BACK SURGERY     multiple  . CARDIAC CATHETERIZATION  04/04/2006   Est EF of 60%  . CARDIAC CATHETERIZATION N/A 07/04/2015   Procedure: Left Heart Cath and Coronary Angiography;  Surgeon: Troy Sine, MD;  Location: [facility];  Service: Cardiovascular;  Laterality: N/A;  . CARDIAC CATHETERIZATION N/A 07/04/2015   Procedure: Coronary Stent Intervention;  Surgeon: Troy Sine, MD;  Location: [facility];  Service: Cardiovascular;  Laterality: N/A;  . CARDIAC CATHETERIZATION N/A 10/13/2015   Procedure: Left Heart Cath and Coronary Angiography;  Surgeon: Troy Sine, MD;  Location: [facility];  Service: Cardiovascular;  Laterality: N/A;  . CARDIAC CATHETERIZATION N/A 12/02/2015   Procedure: Left Heart Cath and Coronary Angiography;  Surgeon: Peter M Martinique, MD;  Location: [facility];  Service: Cardiovascular;  Laterality: N/A;  . CARPAL TUNNEL RELEASE    . CERVICAL BIOPSY     cervical lymph node biopsies  . COLONOSCOPY  May 2002   Dr. Irving Shows :Followup in 5 years, normal exam  . COLONOSCOPY  2008   Dr.  Rehman: Very redundant colon with mild melanosis coli, splenic flexure polyp biopsy with acute complaint of benign colon polyp. Recommended ten-year followup  . CORONARY STENT PLACEMENT  04/11/2006   2 -- Taxus stents to the circumflex   . PERIPHERAL VASCULAR CATHETERIZATION Right 07/09/2015   Procedure: Upper Extremity Angiography;  Surgeon: Conrad Felt, MD;  Location: [facility];  Service: Cardiovascular;  Laterality: Right;  . PERIPHERAL VASCULAR CATHETERIZATION Right 07/10/2015   Procedure: RIGHT SUBCLAVIAN ARTERY THROMBECTOMY;  Surgeon: Serafina Mitchell, MD;  Location: [facility];  Service: Vascular;  Laterality: Right;  . tendonitis     bilateral elbow  . TRIGGER FINGER RELEASE      Social History:  reports that she has never smoked. She has never used smokeless tobacco. She reports that she does not drink alcohol or use drugs.  Allergies: Allergies  Allergen Reactions  . Ace Inhibitors Cough  . Amlodipine     Edema   . Codeine Rash    Family History:  Family History  Problem Relation Age of Onset  . Diabetes Father   . Hypertension Father   . Stroke Father   . Hypertension Brother   . Aneurysm Brother   . Diabetes Brother   . Colon cancer Neg Hx     Prior to Admission medications   Medication Sig Start Date End Date Taking? Authorizing Provider  acetaminophen (TYLENOL) 500 MG tablet Take 2 tablets (1,000 mg total) by mouth every 6 (six) hours as needed (pain). 07/12/15  Yes Lily Kocher, MD  albuterol (PROVENTIL HFA;VENTOLIN HFA) 108 (90 Base) MCG/ACT inhaler Inhale 1-2 puffs into the lungs every 6 (six) hours as needed for wheezing or shortness of breath. 04/01/16  Yes Nanavati, Ankit, MD  albuterol (PROVENTIL) (2.5 MG/3ML) 0.083% nebulizer solution Take 3 mLs (2.5 mg total) by nebulization every 6 (six) hours as needed for wheezing or shortness of breath. 07/16/16  Yes Elgergawy, Silver Huguenin, MD  aspirin EC 81 MG tablet Take 81 mg by mouth daily.   Yes [provider]  Continuous Blood Gluc Sensor ([place]) MISC Use one sensor every 10 days for continuous glucose monitoring. 10/05/16  Yes Nida, Marella Chimes, MD  cyclobenzaprine (FLEXERIL) 5 MG tablet Take 5 mg by mouth at bedtime. 10/03/16  Yes [provider]  ferrous sulfate 325 (65 FE) MG tablet Take 325 mg by mouth daily with breakfast.    Yes  [provider]  fluticasone (FLONASE) 50 MCG/ACT nasal spray Place 1 spray into both nostrils daily.   Yes [provider]  hydrALAZINE (APRESOLINE) 25 MG tablet Take 2 tablets (50 mg total) by mouth 3 (three) times daily. 01/21/16  Yes Herminio Commons, MD  HYDROcodone-homatropine (HYCODAN) 5-1.5 MG/5ML syrup Take 5 mLs by mouth every 6 (six) hours as needed for cough.  08/30/16  Yes [provider]  insulin degludec (TRESIBA) 100 UNIT/ML SOPN FlexTouch Pen Inject 40 Units into the skin at bedtime.    Yes [provider]  insulin lispro (HUMALOG KWIKPEN) 100 UNIT/ML KiwkPen INJECT 18 TO 24 UNITS TOTAL INTO THE SKIN THREE TIMES DAILY 10/05/16  Yes Nida, Marella Chimes, MD  isosorbide mononitrate (IMDUR) 60 MG 24 hr tablet Take 1 tablet (60 mg total) by mouth daily. 07/12/15  Yes Lily Kocher, MD  latanoprost (XALATAN) 0.005 % ophthalmic solution Place 1 drop into both eyes at bedtime.    Yes [provider]  meclizine (ANTIVERT) 25 MG tablet Take 12.5 mg  by mouth 2 (two) times daily.    Yes [provider]  metoprolol succinate (TOPROL-XL) 50 MG 24 hr tablet TAKE ONE TABLET BY MOUTH TWICE DAILY WITH  OR  IMMEDIATELY  FOLLOWING  A  MEAL 04/28/16  Yes Lyda Jester M, PA-C  Multiple Vitamin (MULTIVITAMIN WITH MINERALS) TABS tablet Take 1 tablet by mouth daily.   Yes [provider]  nitroGLYCERIN (NITROSTAT) 0.4 MG SL tablet Place 1 tablet (0.4 mg total) under the tongue every 5 (five) minutes as needed for chest pain. 10/14/15  Yes Simmons, Brittainy M, PA-C  pantoprazole (PROTONIX) 40 MG tablet Take 40 mg by mouth daily.    Yes [provider]  potassium chloride SA (K-DUR,KLOR-CON) 20 MEQ tablet Take 2 tablets (40 mEq total) by mouth daily. 10/14/15  Yes Simmons, Brittainy M, PA-C  RANEXA 500 MG 12 hr tablet TAKE ONE TABLET BY MOUTH TWICE DAILY 05/04/16  Yes Troy Sine, MD  rosuvastatin (CRESTOR) 40 MG tablet Take 1 tablet  (40 mg total) by mouth at bedtime. 07/12/15  Yes Lily Kocher, MD  ticagrelor (BRILINTA) 90 MG TABS tablet Take 1 tablet (90 mg total) by mouth 2 (two) times daily. 10/14/15  Yes Rosita Fire, Brittainy M, PA-C  torsemide (DEMADEX) 20 MG tablet Take 60 mg by mouth 2 (two) times daily. Pt is able to take an additional tablet daily for weight gain greater than 3lbs.   Yes [provider]  trolamine salicylate (ASPERCREME) 10 % cream Apply 1 application topically as needed for muscle pain.   Yes [provider]  diazepam (VALIUM) 5 MG tablet Take 1 tablet (5 mg total) by mouth every 12 (twelve) hours as needed for muscle spasms. 10/29/16   Julianne Rice, MD    Review of Systems:  Constitutional: Denies diaphoresis, appetite change and fatigue.  HEENT: Denies photophobia, eye pain, redness, hearing loss, ear pain, congestion, sore throat, rhinorrhea, sneezing, mouth sores, trouble swallowing, neck pain, neck stiffness and tinnitus.   Respiratory: Denies  chest tightness,  and wheezing.   Cardiovascular: Denies chest pain, palpitations and leg swelling.  Gastrointestinal: Denies nausea, vomiting, abdominal pain, diarrhea, constipation, blood in stool and abdominal distention.  Genitourinary: Denies dysuria, urgency, frequency, hematuria, flank pain and difficulty urinating.  Endocrine: Denies: hot or cold intolerance, sweats, changes in hair or nails, polyuria, polydipsia. Musculoskeletal: Denies myalgias, back pain, joint swelling, arthralgias and gait problem.  Skin: Denies pallor, rash and wound.  Neurological: Denies dizziness, seizures, syncope, weakness, light-headedness, numbness and headaches.  Hematological: Denies adenopathy. Easy bruising, personal or family bleeding history  Psychiatric/Behavioral: Denies suicidal ideation, mood changes, confusion, nervousness, sleep disturbance and agitation    Physical Exam: Vitals:   10/31/16 0830 10/31/16 0900 10/31/16 0940 10/31/16  1248  BP: (!) 134/37 (!) 134/50 (!) 149/52 (!) 147/58  Pulse: 72 72 72 72  Resp: 18 (!) 22 20 18   Temp:   (!) 101.2 F (38.4 C)   TempSrc:   Oral   SpO2: 98% 97% 100% 97%  Weight:   91.4 kg (201 lb 8 oz)   Height:   5\' 8"  (1.727 m)      Constitutional: NAD, calm, comfortable Eyes: PERRL, lids and conjunctivae normal ENMT: Mucous membranes are moist. Posterior pharynx clear of any exudate or lesions.Normal dentition.  Neck: normal, supple, no masses, no thyromegaly Respiratory: Decreased breath sounds right base. Cardiovascular: Regular rate and rhythm, no murmurs / rubs / gallops. No extremity edema. 2+ pedal pulses. No carotid bruits.  Abdomen:  no tenderness, no masses palpated. No hepatosplenomegaly. Bowel sounds positive.  Musculoskeletal: no clubbing / cyanosis. No joint deformity upper and lower extremities. Good ROM, no contractures. Normal muscle tone.  Skin: no rashes, lesions, ulcers. No induration Neurologic: CN 2-12 grossly intact. Sensation intact, DTR normal. Strength 5/5 in all 4.  Psychiatric: Normal judgment and insight. Alert and oriented x 3. Normal mood.    Labs on Admission: I have personally reviewed the following labs and imaging studies  CBC:  Recent Labs Lab 10/31/16 0527  WBC 6.8  NEUTROABS 5.7  HGB 9.7*  HCT 31.4*  MCV 102.3*  PLT 237   Basic Metabolic Panel:  Recent Labs Lab 10/31/16 0527  NA 137  K 4.0  CL 97*  CO2 32  GLUCOSE 162*  BUN 52*  CREATININE 2.06*  CALCIUM 9.5   GFR: Estimated Creatinine Clearance: 29.6 mL/min (A) (by C-G formula based on SCr of 2.06 mg/dL (H)). Liver Function Tests:  Recent Labs Lab 10/31/16 0527  AST 26  ALT 23  ALKPHOS 74  BILITOT 0.5  PROT 7.7  ALBUMIN 3.9   No results for input(s): LIPASE, AMYLASE in the last 168 hours. No results for input(s): AMMONIA in the last 168 hours. Coagulation Profile: No results for input(s): INR, PROTIME in the last 168 hours. Cardiac Enzymes: No results  for input(s): CKTOTAL, CKMB, CKMBINDEX, TROPONINI in the last 168 hours. BNP (last 3 results) No results for input(s): PROBNP in the last 8760 hours. HbA1C: No results for input(s): HGBA1C in the last 72 hours. CBG:  Recent Labs Lab 10/31/16 0900  GLUCAP 136*   Lipid Profile: No results for input(s): CHOL, HDL, LDLCALC, TRIG, CHOLHDL, LDLDIRECT in the last 72 hours. Thyroid Function Tests: No results for input(s): TSH, T4TOTAL, FREET4, T3FREE, THYROIDAB in the last 72 hours. Anemia Panel: No results for input(s): VITAMINB12, FOLATE, FERRITIN, TIBC, IRON, RETICCTPCT in the last 72 hours. Urine analysis:    Component Value Date/Time   COLORURINE YELLOW 10/31/2016 [place] 10/31/2016 0635   LABSPEC 1.009 10/31/2016 0635   PHURINE 7.0 10/31/2016 0635   GLUCOSEU NEGATIVE 10/31/2016 0635   HGBUR NEGATIVE 10/31/2016 0635   BILIRUBINUR NEGATIVE 10/31/2016 0635   KETONESUR NEGATIVE 10/31/2016 0635   PROTEINUR 30 (A) 10/31/2016 0635   UROBILINOGEN 0.2 08/23/2007 2037   NITRITE NEGATIVE 10/31/2016 0635   LEUKOCYTESUR NEGATIVE 10/31/2016 0635   Sepsis Labs: @LABRCNTIP (procalcitonin:4,lacticidven:4) ) Recent Results (from the past 240 hour(s))  Blood Culture (routine x 2)     Status: None (Preliminary result)   Collection Time: 10/31/16  5:28 AM  Result Value Ref Range Status   Specimen Description RIGHT ANTECUBITAL  Final   Special Requests   Final    BOTTLES DRAWN AEROBIC AND ANAEROBIC Blood Culture adequate volume   Culture NO GROWTH < 12 HOURS  Final   Report Status PENDING  Incomplete  Blood Culture (routine x 2)     Status: None (Preliminary result)   Collection Time: 10/31/16  5:48 AM  Result Value Ref Range Status   Specimen Description BLOOD LEFT ARM  Final   Special Requests   Final    BOTTLES DRAWN AEROBIC AND ANAEROBIC Blood Culture adequate volume   Culture NO GROWTH < 12 HOURS  Final   Report Status PENDING  Incomplete  Gram stain     Status:  None   Collection Time: 10/31/16  1:18 PM  Result Value Ref Range Status   Specimen Description PLEURAL  Final  Special Requests NONE  Final   Gram Stain   Final    CYTOSPIN SMEAR NO ORGANISMS SEEN WBC PRESENT,BOTH PMN AND MONONUCLEAR    Report Status 10/31/2016 FINAL  Final     Radiological Exams on Admission: Dg Chest 1 View  Result Date: 10/31/2016 CLINICAL DATA:  Status post right thoracentesis. EXAM: CHEST 1 VIEW COMPARISON:  10/31/2016. FINDINGS: Stable enlarged cardiac silhouette. Minimal left lower lobe atelectasis with improvement. Minimal bilateral pleural fluid, mildly improved on the left and significantly improved on the right following thoracentesis. No pneumothorax. Thoracic spine degenerative changes. IMPRESSION: 1. Almost completely resolved right pleural fluid following thoracentesis without pneumothorax. 2. Small left pleural effusion with improvement with decreased left basilar atelectasis. Electronically Signed   By: Claudie Revering M.D.   On: 10/31/2016 13:30   Dg Chest 2 View  Result Date: 10/31/2016 CLINICAL DATA:  Chest pain and shortness of breath.  Cough. EXAM: CHEST  2 VIEW COMPARISON:  08/07/2016 FINDINGS: There is cardiomegaly. Bilateral pleural effusions, moderate on the right and small on the left. There is vascular congestion and peribronchial cuffing suggesting pulmonary edema. No pneumothorax. IMPRESSION: Cardiomegaly with bilateral pleural effusions and vascular congestion. Peribronchial cuffing may reflect pulmonary edema or bronchial inflammation. Electronically Signed   By: Jeb Levering M.D.   On: 10/31/2016 06:11   Ct Soft Tissue Neck Wo Contrast  Result Date: 10/29/2016 CLINICAL DATA:  Left-sided neck pain after doing exercises in cardiac rehab last week. EXAM: CT NECK WITHOUT CONTRAST TECHNIQUE: Multidetector CT imaging of the neck was performed following the standard protocol without intravenous contrast. COMPARISON:  None. FINDINGS: Pharynx and  larynx: No noted mass or inflammation. Limited at the level of the larynx due to patient motion. Salivary glands: No inflammation, mass, or stone. Thyroid: Normal. Lymph nodes: No enlargement or abnormal density of cervical lymph nodes. Vascular: Limited without contrast. Moderate calcified plaque at the carotid bifurcations. Limited intracranial: Negative Visualized orbits: Partly seen. No acute finding. Right cataract resection. Mastoids and visualized paranasal sinuses: Lobulated soft tissue in the left maxillary could be polyp or lobulated retention cyst. No destructive changes in the neighboring bone. Skeleton: Negative for acute or destructive finding. There are calcified disc bulges from C2-3 to C6-7, with ligamentum flavum thickening and calcification at C4-5. These cause cord deformity at C3-4 and C4-5. Patient has undergone laminectomy at C5-6. Upper chest: Left peritracheal soft tissue is likely multiple borderline enlarged lymph nodes rather than 1 large node or nodal conglomerate. These could be congestive given layering pleural effusions, small where seen. IMPRESSION: 1. Limited by patient motion and lack of IV contrast. No specific explanation for acute neck pain. 2. Advanced cervical spine degeneration with calcified disc bulges and ligamentum flavum thickening. 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 3. Layering pleural effusions, small where seen. 4. Polypoid soft tissue in the left maxillary sinus, nonspecific without contrast. Electronically Signed   By: Monte Fantasia M.D.   On: 10/29/2016 18:39   Ct Chest Wo Contrast  Result Date: 10/31/2016 CLINICAL DATA:  Shortness of breath with exertion EXAM: CT CHEST WITHOUT CONTRAST TECHNIQUE: Multidetector CT imaging of the chest was performed following the standard protocol without IV contrast. COMPARISON:  Chest x-ray 10/31/2016 FINDINGS: Cardiovascular: Aortic and coronary artery  calcifications. No aneurysm. Mild cardiomegaly. Mediastinum/Nodes: Scattered borderline and mildly enlarged mediastinal lymph nodes. Index AP window lymph node has a short axis diameter of 18 mm. Index right paratracheal node has a  short axis diameter of 9 mm. No axillary adenopathy. Lungs/Pleura: Large right pleural effusion and small left pleural effusion. Bibasilar atelectasis. Mild vascular congestion. Upper Abdomen: Imaging into the upper abdomen shows no acute findings. Musculoskeletal: Chest wall soft tissues are unremarkable. No acute bony abnormality. IMPRESSION: Large right pleural effusion and small left effusion. Bibasilar atelectasis. Vascular congestion. Extensive coronary artery calcifications. Mildly enlarged mediastinal lymph nodes, likely reactive. Aortic Atherosclerosis (ICD10-I70.0). Electronically Signed   By: Rolm Baptise M.D.   On: 10/31/2016 07:08   US Thoracentesis Asp Pleural Space W/img Guide  Result Date: 10/31/2016 INDICATION: Right pleural effusion. EXAM: ULTRASOUND GUIDED RIGHT THORACENTESIS MEDICATIONS: None. COMPLICATIONS: None immediate. PROCEDURE: An ultrasound guided thoracentesis was thoroughly discussed with the patient and questions answered. The benefits, risks, alternatives and complications were also discussed. The patient understands and wishes to proceed with the procedure. Written consent was obtained. Ultrasound was performed to localize and mark an adequate pocket of fluid in the right chest. The area was then prepped and draped in the normal sterile fashion. 1% Lidocaine was used for local anesthesia. Under ultrasound guidance a Yuen catheter was introduced. Thoracentesis was performed. The catheter was removed and a dressing applied. FINDINGS: A total of approximately 1 L of blood-tinged fluid was removed. Samples were sent to the laboratory as requested by the clinical team. IMPRESSION: Successful ultrasound guided right thoracentesis yielding 1 L of pleural  fluid. The patient tolerated the procedure well. No immediate complications. Electronically Signed   By: Lorriane Shire M.D.   On: 10/31/2016 13:09    EKG: Independently reviewed. Sinus rhythm, widened QRS of unclear significance, inferior T changes which are not new  Assessment/Plan Principal Problem:   Pleural effusion on right Active Problems:   HCAP (healthcare-associated pneumonia)   Hyperlipemia   Essential hypertension   Type 2 diabetes mellitus with stage 4 chronic kidney disease, with long-term current use of insulin (HCC)    Cough, pleuritic chest pain -She has a large right-sided pleural effusion for which we'll perform a thoracentesis today. -She does not have a white count but she does have a fever and cough and was recently hospitalized so will treat as hospital-acquired pneumonia with vancomycin and cefepime. Culture data has been requested. -Pleural fluid has been sent for typical studies to determine if it is transudative versus exudative as well as for cytology. -Even though BNP is a little elevated, I do not believe this is a CHF exacerbation and I suspect her pleural effusion is likely to be more of an exudate from pneumonia. Also does not appear hypervolemic on exam.  Chronic kidney disease stage IV -Creatinine currently remains at baseline of around 2-2.2.  Coronary artery disease -Stable, no chest pain, not on ACE inhibitor or ARB due to chronic kidney disease. 6  Subclavian artery occlusion  - status post embolectomy. Continue aspirin and Brillinta.  Chronic combined CHF  -Ejection fraction of 45-50%. Euvolemic, continue home dose of torsemide.    type 2 diabetes  -Check A1c, place on moderate sliding scale and continue home dose of long-acting insulin.       DVT prophylaxis:  Lovenox  Code Status:  full code  Family Communication:  daughters at bedside  Disposition Plan:  pending medical stability  Consults called:  none  Admission status:  in  patient    Time Spent:  75 minutes  Lelon Frohlich MD [facility] Pager [phone]  If 7PM-7AM, please contact night-coverage [email or web address] Password [facility]  10/31/2016, 5:17 PM

## 2016-10-31 NOTE — Progress Notes (Signed)
Pharmacy Antibiotic Note  [name] is a 71 y.o. female admitted on 10/31/2016 with pneumonia.  Pharmacy has been consulted for VANCOMYCIN dosing.  Plan: Vancomycin 1500mg  x 1 then 1000mg  IV q24hrs, goal trough 15-20 Monitor labs, progress, c/s  Height: 5\' 8"  (172.7 cm) Weight: 201 lb 8 oz (91.4 kg) IBW/kg (Calculated) : 63.9  Temp (24hrs), Avg:100.8 F (38.2 C), Min:100.4 F (38 C), Max:101.2 F (38.4 C)   Recent Labs Lab 10/31/16 0527 10/31/16 0544 10/31/16 0838  WBC 6.8  --   --   CREATININE 2.06*  --   --   LATICACIDVEN  --  0.62 0.65    Estimated Creatinine Clearance: 29.6 mL/min (A) (by C-G formula based on SCr of 2.06 mg/dL (H)).    Allergies  Allergen Reactions  . Ace Inhibitors Cough  . Amlodipine     Edema   . Codeine Rash   Antimicrobials this admission: Vancomycin 8/27 >>  Cefepime 2gm x 1 8/27 >>   Dose adjustments this admission:  Microbiology results:  BCx: pending  UCx: pending   Sputum:    MRSA PCR:   Thank you for allowing pharmacy to be a part of this patient's care.  Hart Robinsons A 10/31/2016 10:28 AM

## 2016-11-01 ENCOUNTER — Encounter (HOSPITAL_COMMUNITY)
Admission: RE | Admit: 2016-11-01 | Discharge: 2016-11-01 | Disposition: A | Discharge status: Home / Self Care | Payer: BLUE CROSS/BLUE SHIELD | Source: Ambulatory Visit | Attending: Nephrology | Admitting: Nephrology

## 2016-11-01 DIAGNOSIS — J90 Pleural effusion, not elsewhere classified: Secondary | ICD-10-CM | POA: Diagnosis not present

## 2016-11-01 DIAGNOSIS — J189 Pneumonia, unspecified organism: Secondary | ICD-10-CM | POA: Diagnosis not present

## 2016-11-01 LAB — BASIC METABOLIC PANEL
ANION GAP: 8 (ref 5–15)
BUN: 46 mg/dL — ABNORMAL HIGH (ref 6–20)
CALCIUM: 9.2 mg/dL (ref 8.9–10.3)
CO2: 32 mmol/L (ref 22–32)
Chloride: 100 mmol/L — ABNORMAL LOW (ref 101–111)
Creatinine, Ser: 1.68 mg/dL — ABNORMAL HIGH (ref 0.44–1.00)
GFR, EST AFRICAN AMERICAN: 34 mL/min — AB (ref >60)
GFR, EST NON AFRICAN AMERICAN: 30 mL/min — AB (ref >60)
Glucose, Bld: 143 mg/dL — ABNORMAL HIGH (ref 65–99)
Potassium: 3.8 mmol/L (ref 3.5–5.1)
Sodium: 140 mmol/L (ref 135–145)

## 2016-11-01 LAB — CBC
HCT: 30.1 % — ABNORMAL LOW (ref 36.0–46.0)
HEMOGLOBIN: 9.4 g/dL — AB (ref 12.0–15.0)
MCH: 31.1 pg (ref 26.0–34.0)
MCHC: 31.2 g/dL (ref 30.0–36.0)
MCV: 99.7 fL (ref 78.0–100.0)
Platelets: 188 10*3/uL (ref 150–400)
RBC: 3.02 MIL/uL — AB (ref 3.87–5.11)
RDW: 16.3 % — ABNORMAL HIGH (ref 11.5–15.5)
WBC: 5.6 10*3/uL (ref 4.0–10.5)

## 2016-11-01 LAB — HIV ANTIBODY (ROUTINE TESTING W REFLEX): HIV SCREEN 4TH GENERATION: NONREACTIVE

## 2016-11-01 LAB — GLUCOSE, CAPILLARY
Glucose-Capillary: 148 mg/dL — ABNORMAL HIGH (ref 65–99)
Glucose-Capillary: 150 mg/dL — ABNORMAL HIGH (ref 65–99)

## 2016-11-01 LAB — EXPECTORATED SPUTUM ASSESSMENT W GRAM STAIN, RFLX TO RESP C

## 2016-11-01 LAB — PH, BODY FLUID: PH, BODY FLUID: 7.7

## 2016-11-01 LAB — EXPECTORATED SPUTUM ASSESSMENT W REFEX TO RESP CULTURE

## 2016-11-01 LAB — STREP PNEUMONIAE URINARY ANTIGEN: STREP PNEUMO URINARY ANTIGEN: NEGATIVE

## 2016-11-01 MED ORDER — DEXTROSE 5 % IV SOLN
2.0000 g | INTRAVENOUS | Status: DC
  Filled 2016-11-01: qty 2

## 2016-11-01 NOTE — Discharge Summary (Signed)
Physician Discharge Summary  [name] PJK:[id number] DOB: 10-14-1945 DOA: 10/31/2016  PCP: [name], MD  Admit date: 10/31/2016 Discharge date: 11/01/2016  Time spent: 45 minutes  Recommendations for Outpatient Follow-up:  -To be discharged home today. Advised to follow-up with PCP in 2 weeks.   Discharge Diagnoses:  Principal Problem:   Pleural effusion on right Active Problems:   HCAP (healthcare-associated pneumonia)   Hyperlipemia   Essential hypertension   Type 2 diabetes mellitus with stage 4 chronic kidney disease, with long-term current use of insulin ([place])   Discharge Condition: Stable and improved  Filed Weights   10/31/16 0432 10/31/16 0940  Weight: 89.4 kg (197 lb) 91.4 kg (201 lb 8 oz)    History of present illness:  [name] is a 71 y.o. female with multiple medical comorbidities including coronary artery disease, subclavian stenosis status post thromboembolectomy, stage IV kidney disease, hypertension, diabetes among other issues presents to the hospital today with about a 4 day history of right-sided pleuritic chest pain with nonproductive cough. She was hospitalized in June 2018 with congestive heart failure exacerbation. There was also concern at that time of a right lower lobe infiltrate. She had a low-grade temperature 100.4, she does not have a current oxygen requirement, labs are normal with the exception of a creatinine of 2.06 which is her baseline, chest x-ray shows cardiomegaly with bilateral pleural effusions and vascular congestion. CT chest without contrast with a large right pleural effusion and a small left effusion. There are some mildly enlarged mediastinal lymph nodes which are likely reactive. Discussed plan of care with EDP, we have decided to perform a right-sided thoracentesis and treat for hospital-acquired pneumonia. Admission requested.  Hospital Course:   Large right-sided pleural effusion -Status post  thoracentesis with removal of 1 L. Fluid studies appear consistent with transudative effusion, which means this is likely related to congestive heart failure. Cytology remains pending. -Her cough and pleuritic chest pain have resolved with thoracentesis. -She does not appear to have a congestive heart failure exacerbation. -Do not believe she requires outpatient antibiotics given how quickly her symptoms have resolved with thoracentesis and negative imaging.  Stage IV chronic kidney disease Creatinine remains at baseline of around 2-2.2.  Coronary artery disease -Stable, no chest pain, not on ACE inhibitor or ARB due to chronic kidney disease.  Subclavian artery occlusion -Status post thromboembolectomy, continue aspirin and Brillinta  Chronic combined CHF -Ejection fraction of 45-50%, euvolemic, continue torsemide  Type 2 diabetes -Fair control, continue current regimen  Procedures:  None   Consultations: Interventional radiology for thoracentesis.   Discharge Instructions  Discharge Instructions    Diet - low sodium heart healthy    Complete by:  As directed    Increase activity slowly    Complete by:  As directed      Allergies as of 11/01/2016      Reactions   Ace Inhibitors Cough   Amlodipine    Edema   Codeine Rash      Medication List    TAKE these medications   acetaminophen 500 MG tablet Commonly known as:  TYLENOL Take 2 tablets (1,000 mg total) by mouth every 6 (six) hours as needed (pain).   albuterol 108 (90 Base) MCG/ACT inhaler Commonly known as:  PROVENTIL HFA;VENTOLIN HFA Inhale 1-2 puffs into the lungs every 6 (six) hours as needed for wheezing or shortness of breath.   albuterol (2.5 MG/3ML) 0.083% nebulizer solution Commonly known as:  PROVENTIL Take  3 mLs (2.5 mg total) by nebulization every 6 (six) hours as needed for wheezing or shortness of breath.   aspirin EC 81 MG tablet Take 81 mg by mouth daily.   cyclobenzaprine 5 MG  tablet Commonly known as:  FLEXERIL Take 5 mg by mouth at bedtime.   diazepam 5 MG tablet Commonly known as:  VALIUM Take 1 tablet (5 mg total) by mouth every 12 (twelve) hours as needed for muscle spasms.   ferrous sulfate 325 (65 FE) MG tablet Take 325 mg by mouth daily with breakfast.   fluticasone 50 MCG/ACT nasal spray Commonly known as:  FLONASE Place 1 spray into both nostrils daily.   FREESTYLE LIBRE SENSOR SYSTEM Misc Use one sensor every 10 days for continuous glucose monitoring.   hydrALAZINE 25 MG tablet Commonly known as:  APRESOLINE Take 2 tablets (50 mg total) by mouth 3 (three) times daily.   HYDROcodone-homatropine 5-1.5 MG/5ML syrup Commonly known as:  HYCODAN Take 5 mLs by mouth every 6 (six) hours as needed for cough.   insulin degludec 100 UNIT/ML Sopn FlexTouch Pen Commonly known as:  TRESIBA Inject 40 Units into the skin at bedtime.   insulin lispro 100 UNIT/ML KiwkPen Commonly known as:  HUMALOG KWIKPEN INJECT 18 TO 24 UNITS TOTAL INTO THE SKIN THREE TIMES DAILY   isosorbide mononitrate 60 MG 24 hr tablet Commonly known as:  IMDUR Take 1 tablet (60 mg total) by mouth daily.   latanoprost 0.005 % ophthalmic solution Commonly known as:  XALATAN Place 1 drop into both eyes at bedtime.   meclizine 25 MG tablet Commonly known as:  ANTIVERT Take 12.5 mg by mouth 2 (two) times daily.   metoprolol succinate 50 MG 24 hr tablet Commonly known as:  TOPROL-XL TAKE ONE TABLET BY MOUTH TWICE DAILY WITH  OR  IMMEDIATELY  FOLLOWING  A  MEAL   multivitamin with minerals Tabs tablet Take 1 tablet by mouth daily.   nitroGLYCERIN 0.4 MG SL tablet Commonly known as:  NITROSTAT Place 1 tablet (0.4 mg total) under the tongue every 5 (five) minutes as needed for chest pain.   pantoprazole 40 MG tablet Commonly known as:  PROTONIX Take 40 mg by mouth daily.   potassium chloride SA 20 MEQ tablet Commonly known as:  K-DUR,KLOR-CON Take 2 tablets (40 mEq  total) by mouth daily.   RANEXA 500 MG 12 hr tablet Generic drug:  ranolazine TAKE ONE TABLET BY MOUTH TWICE DAILY   rosuvastatin 40 MG tablet Commonly known as:  CRESTOR Take 1 tablet (40 mg total) by mouth at bedtime.   ticagrelor 90 MG Tabs tablet Commonly known as:  BRILINTA Take 1 tablet (90 mg total) by mouth 2 (two) times daily.   torsemide 20 MG tablet Commonly known as:  DEMADEX Take 60 mg by mouth 2 (two) times daily. Pt is able to take an additional tablet daily for weight gain greater than 3lbs.   trolamine salicylate 10 % cream Commonly known as:  ASPERCREME Apply 1 application topically as needed for muscle pain.            Discharge Care Instructions        Start     Ordered   11/01/16 0000  Increase activity slowly     11/01/16 1226   11/01/16 0000  Diet - low sodium heart healthy     11/01/16 1226     Allergies  Allergen Reactions  . Ace Inhibitors Cough  . Amlodipine  Edema   . Codeine Rash   Follow-up Information    [name], MD. Schedule an appointment as soon as possible for a visit in 2 week(s).   Specialty:  Family Medicine Contact information: [address] [place] [address] [phone]            The results of significant diagnostics from this hospitalization (including imaging, microbiology, ancillary and laboratory) are listed below for reference.    Significant Diagnostic Studies: Dg Chest 1 View  Result Date: 10/31/2016 CLINICAL DATA:  Status post right thoracentesis. EXAM: CHEST 1 VIEW COMPARISON:  10/31/2016. FINDINGS: Stable enlarged cardiac silhouette. Minimal left lower lobe atelectasis with improvement. Minimal bilateral pleural fluid, mildly improved on the left and significantly improved on the right following thoracentesis. No pneumothorax. Thoracic spine degenerative changes. IMPRESSION: 1. Almost completely resolved right pleural fluid following thoracentesis without pneumothorax. 2. Small left  pleural effusion with improvement with decreased left basilar atelectasis. Electronically Signed   By: Claudie Revering M.D.   On: 10/31/2016 13:30   Dg Chest 2 View  Result Date: 10/31/2016 CLINICAL DATA:  Chest pain and shortness of breath.  Cough. EXAM: CHEST  2 VIEW COMPARISON:  08/07/2016 FINDINGS: There is cardiomegaly. Bilateral pleural effusions, moderate on the right and small on the left. There is vascular congestion and peribronchial cuffing suggesting pulmonary edema. No pneumothorax. IMPRESSION: Cardiomegaly with bilateral pleural effusions and vascular congestion. Peribronchial cuffing may reflect pulmonary edema or bronchial inflammation. Electronically Signed   By: Jeb Levering M.D.   On: 10/31/2016 06:11   Ct Soft Tissue Neck Wo Contrast  Result Date: 10/29/2016 CLINICAL DATA:  Left-sided neck pain after doing exercises in cardiac rehab last week. EXAM: CT NECK WITHOUT CONTRAST TECHNIQUE: Multidetector CT imaging of the neck was performed following the standard protocol without intravenous contrast. COMPARISON:  None. FINDINGS: Pharynx and larynx: No noted mass or inflammation. Limited at the level of the larynx due to patient motion. Salivary glands: No inflammation, mass, or stone. Thyroid: Normal. Lymph nodes: No enlargement or abnormal density of cervical lymph nodes. Vascular: Limited without contrast. Moderate calcified plaque at the carotid bifurcations. Limited intracranial: Negative Visualized orbits: Partly seen. No acute finding. Right cataract resection. Mastoids and visualized paranasal sinuses: Lobulated soft tissue in the left maxillary could be polyp or lobulated retention cyst. No destructive changes in the neighboring bone. Skeleton: Negative for acute or destructive finding. There are calcified disc bulges from C2-3 to C6-7, with ligamentum flavum thickening and calcification at C4-5. These cause cord deformity at C3-4 and C4-5. Patient has undergone laminectomy at C5-6.  Upper chest: Left peritracheal soft tissue is likely multiple borderline enlarged lymph nodes rather than 1 large node or nodal conglomerate. These could be congestive given layering pleural effusions, small where seen. IMPRESSION: 1. Limited by patient motion and lack of IV contrast. No specific explanation for acute neck pain. 2. Advanced cervical spine degeneration with calcified disc bulges and ligamentum flavum thickening. 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 3. Layering pleural effusions, small where seen. 4. Polypoid soft tissue in the left maxillary sinus, nonspecific without contrast. Electronically Signed   By: Monte Fantasia M.D.   On: 10/29/2016 18:39   Ct Chest Wo Contrast  Result Date: 10/31/2016 CLINICAL DATA:  Shortness of breath with exertion EXAM: CT CHEST WITHOUT CONTRAST TECHNIQUE: Multidetector CT imaging of the chest was performed following the standard protocol without IV contrast. COMPARISON:  Chest  x-ray 10/31/2016 FINDINGS: Cardiovascular: Aortic and coronary artery calcifications. No aneurysm. Mild cardiomegaly. Mediastinum/Nodes: Scattered borderline and mildly enlarged mediastinal lymph nodes. Index AP window lymph node has a short axis diameter of 18 mm. Index right paratracheal node has a short axis diameter of 9 mm. No axillary adenopathy. Lungs/Pleura: Large right pleural effusion and small left pleural effusion. Bibasilar atelectasis. Mild vascular congestion. Upper Abdomen: Imaging into the upper abdomen shows no acute findings. Musculoskeletal: Chest wall soft tissues are unremarkable. No acute bony abnormality. IMPRESSION: Large right pleural effusion and small left effusion. Bibasilar atelectasis. Vascular congestion. Extensive coronary artery calcifications. Mildly enlarged mediastinal lymph nodes, likely reactive. Aortic Atherosclerosis (ICD10-I70.0). Electronically Signed   By: Rolm Baptise M.D.   On: 10/31/2016  07:08   US Thoracentesis Asp Pleural Space W/img Guide  Result Date: 10/31/2016 INDICATION: Right pleural effusion. EXAM: ULTRASOUND GUIDED RIGHT THORACENTESIS MEDICATIONS: None. COMPLICATIONS: None immediate. PROCEDURE: An ultrasound guided thoracentesis was thoroughly discussed with the patient and questions answered. The benefits, risks, alternatives and complications were also discussed. The patient understands and wishes to proceed with the procedure. Written consent was obtained. Ultrasound was performed to localize and mark an adequate pocket of fluid in the right chest. The area was then prepped and draped in the normal sterile fashion. 1% Lidocaine was used for local anesthesia. Under ultrasound guidance a Yuen catheter was introduced. Thoracentesis was performed. The catheter was removed and a dressing applied. FINDINGS: A total of approximately 1 L of blood-tinged fluid was removed. Samples were sent to the laboratory as requested by the clinical team. IMPRESSION: Successful ultrasound guided right thoracentesis yielding 1 L of pleural fluid. The patient tolerated the procedure well. No immediate complications. Electronically Signed   By: Lorriane Shire M.D.   On: 10/31/2016 13:09    Microbiology: Recent Results (from the past 240 hour(s))  Blood Culture (routine x 2)     Status: None (Preliminary result)   Collection Time: 10/31/16  5:28 AM  Result Value Ref Range Status   Specimen Description RIGHT ANTECUBITAL  Final   Special Requests   Final    BOTTLES DRAWN AEROBIC AND ANAEROBIC Blood Culture adequate volume   Culture NO GROWTH 1 DAY  Final   Report Status PENDING  Incomplete  Blood Culture (routine x 2)     Status: None (Preliminary result)   Collection Time: 10/31/16  5:48 AM  Result Value Ref Range Status   Specimen Description BLOOD LEFT ARM  Final   Special Requests   Final    BOTTLES DRAWN AEROBIC AND ANAEROBIC Blood Culture adequate volume   Culture NO GROWTH 1 DAY   Final   Report Status PENDING  Incomplete  Gram stain     Status: None   Collection Time: 10/31/16  1:18 PM  Result Value Ref Range Status   Specimen Description PLEURAL  Final   Special Requests NONE  Final   Gram Stain   Final    CYTOSPIN SMEAR NO ORGANISMS SEEN WBC PRESENT,BOTH PMN AND MONONUCLEAR    Report Status 10/31/2016 FINAL  Final  Culture, body fluid-bottle     Status: None (Preliminary result)   Collection Time: 10/31/16  1:18 PM  Result Value Ref Range Status   Specimen Description PLEURAL COLLECTED BY DOCTOR  Final   Special Requests BOTTLES DRAWN AEROBIC AND ANAEROBIC 10 CC EACH  Final   Culture NO GROWTH < 24 HOURS  Final   Report Status PENDING  Incomplete  Culture, sputum-assessment  Status: None   Collection Time: 10/31/16  5:15 PM  Result Value Ref Range Status   Specimen Description EXTPRATED SPUTUM  Final   Special Requests NONE  Final   Sputum evaluation THIS SPECIMEN IS ACCEPTABLE FOR SPUTUM CULTURE  Final   Report Status 11/01/2016 FINAL  Final  Culture, respiratory (NON-Expectorated)     Status: None (Preliminary result)   Collection Time: 10/31/16  5:15 PM  Result Value Ref Range Status   Specimen Description PENDING  Incomplete   Special Requests NONE Reflexed from [id number]  Final   Gram Stain   Final    FEW WBC PRESENT, PREDOMINANTLY PMN MODERATE SQUAMOUS EPITHELIAL CELLS PRESENT MODERATE GRAM POSITIVE COCCI IN PAIRS AND CHAINS MODERATE GRAM POSITIVE RODS FEW GRAM NEGATIVE RODS FEW YEAST FEW GRAM NEGATIVE COCCOBACILLI Performed at [facility], [place] [address], [place], [place] [address]    Culture PENDING  Incomplete   Report Status PENDING  Incomplete     Labs: Basic Metabolic Panel:  Recent Labs Lab 10/31/16 0527 11/01/16 0717  NA 137 140  K 4.0 3.8  CL 97* 100*  CO2 32 32  GLUCOSE 162* 143*  BUN 52* 46*  CREATININE 2.06* 1.68*  CALCIUM 9.5 9.2   Liver Function Tests:  Recent Labs Lab 10/31/16 0527  AST 26  ALT  23  ALKPHOS 74  BILITOT 0.5  PROT 7.7  ALBUMIN 3.9   No results for input(s): LIPASE, AMYLASE in the last 168 hours. No results for input(s): AMMONIA in the last 168 hours. CBC:  Recent Labs Lab 10/31/16 0527 11/01/16 0717  WBC 6.8 5.6  NEUTROABS 5.7  --   HGB 9.7* 9.4*  HCT 31.4* 30.1*  MCV 102.3* 99.7  PLT 176 188   Cardiac Enzymes: No results for input(s): CKTOTAL, CKMB, CKMBINDEX, TROPONINI in the last 168 hours. BNP: BNP (last 3 results)  Recent Labs  07/14/16 0632 08/07/16 0328 10/31/16 0528  BNP 345.0* 356.0* 648.0*    ProBNP (last 3 results) No results for input(s): PROBNP in the last 8760 hours.  CBG:  Recent Labs Lab 10/31/16 0900 10/31/16 2102 11/01/16 0729 11/01/16 1135  GLUCAP 136* 316* 148* 150*       Signed:  HERNANDEZ ACOSTA,Lynx Goodrich  [facility] Pager: [phone] 11/01/2016, 4:12 PM

## 2016-11-01 NOTE — Progress Notes (Signed)
Discharge instructions gone over with patient, verbalized understanding.

## 2016-11-02 LAB — URINE CULTURE

## 2016-11-03 LAB — CULTURE, RESPIRATORY: CULTURE: NORMAL

## 2016-11-03 LAB — LEGIONELLA PNEUMOPHILA SEROGP 1 UR AG: L. PNEUMOPHILA SEROGP 1 UR AG: NEGATIVE

## 2016-11-03 LAB — CULTURE, RESPIRATORY W GRAM STAIN

## 2016-11-05 LAB — CULTURE, BODY FLUID W GRAM STAIN -BOTTLE: Culture: NO GROWTH

## 2016-11-05 LAB — CULTURE, BODY FLUID-BOTTLE

## 2016-11-05 LAB — CULTURE, BLOOD (ROUTINE X 2)
CULTURE: NO GROWTH
Culture: NO GROWTH
Special Requests: ADEQUATE
Special Requests: ADEQUATE

## 2016-11-10 ENCOUNTER — Emergency Department (HOSPITAL_COMMUNITY): Payer: BLUE CROSS/BLUE SHIELD

## 2016-11-10 ENCOUNTER — Inpatient Hospital Stay (HOSPITAL_COMMUNITY)
Admission: EM | Admit: 2016-11-10 | Discharge: 2016-11-16 | DRG: 308 | Disposition: A | Discharge status: Home Health | Payer: BLUE CROSS/BLUE SHIELD | Attending: Internal Medicine | Admitting: Internal Medicine

## 2016-11-10 ENCOUNTER — Encounter (HOSPITAL_COMMUNITY): Payer: Self-pay | Admitting: Emergency Medicine

## 2016-11-10 DIAGNOSIS — E876 Hypokalemia: Secondary | ICD-10-CM | POA: Diagnosis present

## 2016-11-10 DIAGNOSIS — I5043 Acute on chronic combined systolic (congestive) and diastolic (congestive) heart failure: Secondary | ICD-10-CM | POA: Diagnosis not present

## 2016-11-10 DIAGNOSIS — Z885 Allergy status to narcotic agent status: Secondary | ICD-10-CM

## 2016-11-10 DIAGNOSIS — B9620 Unspecified Escherichia coli [E. coli] as the cause of diseases classified elsewhere: Secondary | ICD-10-CM | POA: Diagnosis present

## 2016-11-10 DIAGNOSIS — E160 Drug-induced hypoglycemia without coma: Secondary | ICD-10-CM | POA: Diagnosis present

## 2016-11-10 DIAGNOSIS — T383X5A Adverse effect of insulin and oral hypoglycemic [antidiabetic] drugs, initial encounter: Secondary | ICD-10-CM

## 2016-11-10 DIAGNOSIS — N179 Acute kidney failure, unspecified: Secondary | ICD-10-CM | POA: Diagnosis present

## 2016-11-10 DIAGNOSIS — N289 Disorder of kidney and ureter, unspecified: Secondary | ICD-10-CM

## 2016-11-10 DIAGNOSIS — I480 Paroxysmal atrial fibrillation: Principal | ICD-10-CM | POA: Diagnosis present

## 2016-11-10 DIAGNOSIS — Z794 Long term (current) use of insulin: Secondary | ICD-10-CM

## 2016-11-10 DIAGNOSIS — Z7951 Long term (current) use of inhaled steroids: Secondary | ICD-10-CM

## 2016-11-10 DIAGNOSIS — Z90710 Acquired absence of both cervix and uterus: Secondary | ICD-10-CM

## 2016-11-10 DIAGNOSIS — E11649 Type 2 diabetes mellitus with hypoglycemia without coma: Secondary | ICD-10-CM | POA: Diagnosis present

## 2016-11-10 DIAGNOSIS — I482 Chronic atrial fibrillation: Secondary | ICD-10-CM | POA: Diagnosis present

## 2016-11-10 DIAGNOSIS — N186 End stage renal disease: Secondary | ICD-10-CM | POA: Diagnosis present

## 2016-11-10 DIAGNOSIS — N3281 Overactive bladder: Secondary | ICD-10-CM | POA: Diagnosis present

## 2016-11-10 DIAGNOSIS — Z8249 Family history of ischemic heart disease and other diseases of the circulatory system: Secondary | ICD-10-CM

## 2016-11-10 DIAGNOSIS — N390 Urinary tract infection, site not specified: Secondary | ICD-10-CM | POA: Diagnosis present

## 2016-11-10 DIAGNOSIS — E1122 Type 2 diabetes mellitus with diabetic chronic kidney disease: Secondary | ICD-10-CM | POA: Diagnosis present

## 2016-11-10 DIAGNOSIS — Z888 Allergy status to other drugs, medicaments and biological substances status: Secondary | ICD-10-CM

## 2016-11-10 DIAGNOSIS — I252 Old myocardial infarction: Secondary | ICD-10-CM

## 2016-11-10 DIAGNOSIS — E869 Volume depletion, unspecified: Secondary | ICD-10-CM | POA: Diagnosis present

## 2016-11-10 DIAGNOSIS — E1159 Type 2 diabetes mellitus with other circulatory complications: Secondary | ICD-10-CM | POA: Diagnosis present

## 2016-11-10 DIAGNOSIS — I459 Conduction disorder, unspecified: Secondary | ICD-10-CM | POA: Diagnosis present

## 2016-11-10 DIAGNOSIS — Z833 Family history of diabetes mellitus: Secondary | ICD-10-CM

## 2016-11-10 DIAGNOSIS — N184 Chronic kidney disease, stage 4 (severe): Secondary | ICD-10-CM | POA: Diagnosis present

## 2016-11-10 DIAGNOSIS — Z7982 Long term (current) use of aspirin: Secondary | ICD-10-CM

## 2016-11-10 DIAGNOSIS — E785 Hyperlipidemia, unspecified: Secondary | ICD-10-CM | POA: Diagnosis present

## 2016-11-10 DIAGNOSIS — I471 Supraventricular tachycardia: Secondary | ICD-10-CM | POA: Diagnosis present

## 2016-11-10 DIAGNOSIS — I130 Hypertensive heart and chronic kidney disease with heart failure and stage 1 through stage 4 chronic kidney disease, or unspecified chronic kidney disease: Secondary | ICD-10-CM | POA: Diagnosis present

## 2016-11-10 DIAGNOSIS — N189 Chronic kidney disease, unspecified: Secondary | ICD-10-CM

## 2016-11-10 DIAGNOSIS — I519 Heart disease, unspecified: Secondary | ICD-10-CM

## 2016-11-10 DIAGNOSIS — K5909 Other constipation: Secondary | ICD-10-CM | POA: Diagnosis present

## 2016-11-10 DIAGNOSIS — R531 Weakness: Secondary | ICD-10-CM

## 2016-11-10 DIAGNOSIS — J189 Pneumonia, unspecified organism: Secondary | ICD-10-CM | POA: Diagnosis not present

## 2016-11-10 DIAGNOSIS — Z86718 Personal history of other venous thrombosis and embolism: Secondary | ICD-10-CM

## 2016-11-10 DIAGNOSIS — R9439 Abnormal result of other cardiovascular function study: Secondary | ICD-10-CM

## 2016-11-10 DIAGNOSIS — F411 Generalized anxiety disorder: Secondary | ICD-10-CM | POA: Diagnosis present

## 2016-11-10 DIAGNOSIS — D631 Anemia in chronic kidney disease: Secondary | ICD-10-CM | POA: Diagnosis present

## 2016-11-10 DIAGNOSIS — Z79899 Other long term (current) drug therapy: Secondary | ICD-10-CM

## 2016-11-10 DIAGNOSIS — Z955 Presence of coronary angioplasty implant and graft: Secondary | ICD-10-CM

## 2016-11-10 DIAGNOSIS — F410 Panic disorder [episodic paroxysmal anxiety] without agoraphobia: Secondary | ICD-10-CM | POA: Diagnosis present

## 2016-11-10 DIAGNOSIS — R011 Cardiac murmur, unspecified: Secondary | ICD-10-CM | POA: Diagnosis present

## 2016-11-10 DIAGNOSIS — I4891 Unspecified atrial fibrillation: Secondary | ICD-10-CM | POA: Diagnosis present

## 2016-11-10 DIAGNOSIS — K219 Gastro-esophageal reflux disease without esophagitis: Secondary | ICD-10-CM | POA: Diagnosis present

## 2016-11-10 LAB — CBC
HCT: 27.5 % — ABNORMAL LOW (ref 36.0–46.0)
Hemoglobin: 8.8 g/dL — ABNORMAL LOW (ref 12.0–15.0)
MCH: 31.4 pg (ref 26.0–34.0)
MCHC: 32 g/dL (ref 30.0–36.0)
MCV: 98.2 fL (ref 78.0–100.0)
Platelets: 341 10*3/uL (ref 150–400)
RBC: 2.8 MIL/uL — ABNORMAL LOW (ref 3.87–5.11)
RDW: 16.2 % — ABNORMAL HIGH (ref 11.5–15.5)
WBC: 7.1 10*3/uL (ref 4.0–10.5)

## 2016-11-10 LAB — URINALYSIS, ROUTINE W REFLEX MICROSCOPIC
Bilirubin Urine: NEGATIVE
GLUCOSE, UA: NEGATIVE mg/dL
HGB URINE DIPSTICK: NEGATIVE
KETONES UR: NEGATIVE mg/dL
Nitrite: NEGATIVE
PH: 5 (ref 5.0–8.0)
Protein, ur: 100 mg/dL — AB
Specific Gravity, Urine: 1.012 (ref 1.005–1.030)

## 2016-11-10 LAB — BASIC METABOLIC PANEL
Anion gap: 12 (ref 5–15)
BUN: 65 mg/dL — AB (ref 6–20)
CALCIUM: 9.6 mg/dL (ref 8.9–10.3)
CO2: 29 mmol/L (ref 22–32)
CREATININE: 2.82 mg/dL — AB (ref 0.44–1.00)
Chloride: 95 mmol/L — ABNORMAL LOW (ref 101–111)
GFR calc Af Amer: 18 mL/min — ABNORMAL LOW (ref >60)
GFR calc non Af Amer: 16 mL/min — ABNORMAL LOW (ref >60)
GLUCOSE: 215 mg/dL — AB (ref 65–99)
Potassium: 3.6 mmol/L (ref 3.5–5.1)
Sodium: 136 mmol/L (ref 135–145)

## 2016-11-10 LAB — CBG MONITORING, ED: Glucose-Capillary: 194 mg/dL — ABNORMAL HIGH (ref 65–99)

## 2016-11-10 MED ORDER — ADULT MULTIVITAMIN W/MINERALS CH
1.0000 | ORAL_TABLET | Freq: Every day | ORAL | Status: DC
  Administered 2016-11-11 – 2016-11-16 (×6): 1 via ORAL
  Filled 2016-11-10 (×6): qty 1

## 2016-11-10 MED ORDER — MECLIZINE HCL 12.5 MG PO TABS
12.5000 mg | ORAL_TABLET | Freq: Two times a day (BID) | ORAL | Status: DC
  Administered 2016-11-11 – 2016-11-16 (×12): 12.5 mg via ORAL
  Filled 2016-11-10 (×12): qty 1

## 2016-11-10 MED ORDER — ALBUTEROL SULFATE (2.5 MG/3ML) 0.083% IN NEBU: 2.5000 mg | INHALATION_SOLUTION | Freq: Four times a day (QID) | RESPIRATORY_TRACT | Status: DC | PRN

## 2016-11-10 MED ORDER — ASPIRIN EC 81 MG PO TBEC
81.0000 mg | DELAYED_RELEASE_TABLET | Freq: Every day | ORAL | Status: DC
  Administered 2016-11-11 – 2016-11-14 (×4): 81 mg via ORAL
  Filled 2016-11-10 (×4): qty 1

## 2016-11-10 MED ORDER — POTASSIUM CHLORIDE CRYS ER 20 MEQ PO TBCR
40.0000 meq | EXTENDED_RELEASE_TABLET | Freq: Every day | ORAL | Status: DC
  Administered 2016-11-11 – 2016-11-12 (×2): 40 meq via ORAL
  Filled 2016-11-10 (×2): qty 2

## 2016-11-10 MED ORDER — LATANOPROST 0.005 % OP SOLN
1.0000 [drp] | Freq: Every day | OPHTHALMIC | Status: DC
  Administered 2016-11-11 – 2016-11-15 (×6): 1 [drp] via OPHTHALMIC
  Filled 2016-11-10 (×2): qty 2.5

## 2016-11-10 MED ORDER — SODIUM CHLORIDE 0.9 % IV SOLN: 250.0000 mL | INTRAVENOUS | Status: DC | PRN

## 2016-11-10 MED ORDER — SODIUM CHLORIDE 0.9% FLUSH: 3.0000 mL | INTRAVENOUS | Status: DC | PRN

## 2016-11-10 MED ORDER — HYDROCODONE-HOMATROPINE 5-1.5 MG/5ML PO SYRP
5.0000 mL | ORAL_SOLUTION | Freq: Four times a day (QID) | ORAL | Status: DC | PRN
  Administered 2016-11-16: 5 mL via ORAL
  Filled 2016-11-10: qty 5

## 2016-11-10 MED ORDER — INSULIN GLARGINE 100 UNIT/ML ~~LOC~~ SOLN
40.0000 [IU] | Freq: Every day | SUBCUTANEOUS | Status: DC
  Filled 2016-11-10: qty 0.4

## 2016-11-10 MED ORDER — SODIUM CHLORIDE 0.9 % IV BOLUS (SEPSIS)
500.0000 mL | Freq: Once | INTRAVENOUS | Status: AC
  Administered 2016-11-10: 500 mL via INTRAVENOUS

## 2016-11-10 MED ORDER — ALBUTEROL SULFATE HFA 108 (90 BASE) MCG/ACT IN AERS: 1.0000 | INHALATION_SPRAY | Freq: Four times a day (QID) | RESPIRATORY_TRACT | Status: DC | PRN

## 2016-11-10 MED ORDER — NITROGLYCERIN 0.4 MG SL SUBL: 0.4000 mg | SUBLINGUAL_TABLET | SUBLINGUAL | Status: DC | PRN

## 2016-11-10 MED ORDER — DILTIAZEM LOAD VIA INFUSION
10.0000 mg | Freq: Once | INTRAVENOUS | Status: AC
  Administered 2016-11-10: 10 mg via INTRAVENOUS
  Filled 2016-11-10: qty 10

## 2016-11-10 MED ORDER — ROSUVASTATIN CALCIUM 20 MG PO TABS
40.0000 mg | ORAL_TABLET | Freq: Every day | ORAL | Status: DC
  Administered 2016-11-11 – 2016-11-15 (×6): 40 mg via ORAL
  Filled 2016-11-10 (×6): qty 2

## 2016-11-10 MED ORDER — METOPROLOL SUCCINATE ER 25 MG PO TB24
25.0000 mg | ORAL_TABLET | Freq: Every day | ORAL | Status: DC
  Administered 2016-11-11 (×2): 25 mg via ORAL
  Filled 2016-11-10 (×2): qty 1

## 2016-11-10 MED ORDER — TORSEMIDE 20 MG PO TABS
60.0000 mg | ORAL_TABLET | Freq: Two times a day (BID) | ORAL | Status: DC
  Administered 2016-11-11 (×2): 60 mg via ORAL
  Filled 2016-11-10 (×2): qty 3

## 2016-11-10 MED ORDER — DILTIAZEM HCL-DEXTROSE 100-5 MG/100ML-% IV SOLN (PREMIX)
5.0000 mg/h | INTRAVENOUS | Status: DC
  Administered 2016-11-10: 5 mg/h via INTRAVENOUS
  Filled 2016-11-10: qty 100

## 2016-11-10 MED ORDER — RANOLAZINE ER 500 MG PO TB12
500.0000 mg | ORAL_TABLET | Freq: Two times a day (BID) | ORAL | Status: DC
  Administered 2016-11-11 – 2016-11-12 (×4): 500 mg via ORAL
  Filled 2016-11-10 (×12): qty 1

## 2016-11-10 MED ORDER — FLUTICASONE PROPIONATE 50 MCG/ACT NA SUSP
1.0000 | Freq: Every day | NASAL | Status: DC
  Administered 2016-11-11 – 2016-11-16 (×5): 1 via NASAL
  Filled 2016-11-10 (×2): qty 16

## 2016-11-10 MED ORDER — SODIUM CHLORIDE 0.9% FLUSH
3.0000 mL | Freq: Two times a day (BID) | INTRAVENOUS | Status: DC
  Administered 2016-11-11 – 2016-11-12 (×4): 3 mL via INTRAVENOUS

## 2016-11-10 MED ORDER — METOPROLOL SUCCINATE ER 50 MG PO TB24
50.0000 mg | ORAL_TABLET | Freq: Every day | ORAL | Status: DC
  Administered 2016-11-11 – 2016-11-14 (×4): 50 mg via ORAL
  Filled 2016-11-10 (×4): qty 1

## 2016-11-10 MED ORDER — HYDRALAZINE HCL 25 MG PO TABS
50.0000 mg | ORAL_TABLET | Freq: Three times a day (TID) | ORAL | Status: DC
  Administered 2016-11-11 – 2016-11-14 (×11): 50 mg via ORAL
  Filled 2016-11-10 (×11): qty 2

## 2016-11-10 MED ORDER — CYCLOBENZAPRINE HCL 10 MG PO TABS
5.0000 mg | ORAL_TABLET | Freq: Every day | ORAL | Status: DC
  Administered 2016-11-11 – 2016-11-12 (×3): 5 mg via ORAL
  Filled 2016-11-10 (×3): qty 1

## 2016-11-10 MED ORDER — SODIUM CHLORIDE 0.9 % IV SOLN
Freq: Once | INTRAVENOUS | Status: AC
  Administered 2016-11-10: 1000 mL via INTRAVENOUS

## 2016-11-10 MED ORDER — DIAZEPAM 5 MG PO TABS
5.0000 mg | ORAL_TABLET | Freq: Two times a day (BID) | ORAL | Status: DC | PRN
  Administered 2016-11-14 – 2016-11-15 (×2): 5 mg via ORAL
  Filled 2016-11-10 (×2): qty 1

## 2016-11-10 MED ORDER — SODIUM CHLORIDE 0.9 % IV BOLUS (SEPSIS)
1000.0000 mL | Freq: Once | INTRAVENOUS | Status: DC
Start: 2016-11-10 — End: 2016-11-10

## 2016-11-10 MED ORDER — FERROUS SULFATE 325 (65 FE) MG PO TABS
325.0000 mg | ORAL_TABLET | Freq: Every day | ORAL | Status: DC
  Administered 2016-11-11 – 2016-11-16 (×6): 325 mg via ORAL
  Filled 2016-11-10 (×5): qty 1

## 2016-11-10 MED ORDER — PANTOPRAZOLE SODIUM 40 MG PO TBEC
40.0000 mg | DELAYED_RELEASE_TABLET | Freq: Every day | ORAL | Status: DC
  Administered 2016-11-11 – 2016-11-16 (×6): 40 mg via ORAL
  Filled 2016-11-10 (×6): qty 1

## 2016-11-10 MED ORDER — TICAGRELOR 90 MG PO TABS
90.0000 mg | ORAL_TABLET | Freq: Two times a day (BID) | ORAL | Status: DC
  Administered 2016-11-11 – 2016-11-14 (×8): 90 mg via ORAL
  Filled 2016-11-10 (×15): qty 1

## 2016-11-10 MED ORDER — ISOSORBIDE MONONITRATE ER 60 MG PO TB24
60.0000 mg | ORAL_TABLET | Freq: Every day | ORAL | Status: DC
Start: 2016-11-11 — End: 2016-11-16
  Administered 2016-11-11 – 2016-11-16 (×6): 60 mg via ORAL
  Filled 2016-11-10 (×6): qty 1

## 2016-11-10 MED ORDER — ACETAMINOPHEN 500 MG PO TABS: 1000.0000 mg | ORAL_TABLET | Freq: Four times a day (QID) | ORAL | Status: DC | PRN

## 2016-11-10 NOTE — ED Triage Notes (Signed)
Pt reports generalized weakness and intermittent dizziness for last several weeks. Pt reports has appointment for iron infusion tomorrow at 1300. Pt reports episode of shortness of breath and "panic attack" this afternoon. Pt denies shortness of breath or chest pain at this time.

## 2016-11-10 NOTE — ED Notes (Signed)
Pt states she does not need to urinate at this time, aware of DO  

## 2016-11-10 NOTE — ED Notes (Signed)
Pt given some water to drink at this time. 

## 2016-11-10 NOTE — ED Provider Notes (Signed)
[facility] DEPT Provider Note   CSN: [id number] Arrival date & time: 11/10/16  1832     History   Chief Complaint Chief Complaint  Patient presents with  . Weakness    HPI [name] is a 71 y.o. female.  She presents for evaluation of weakness, and an episode of anxiety, which occurred when she was discussing her husband's current hospitalization with 1 of his medical providers.  Apparently the husband is due to be discharged soon, and he will come home with a Foley catheter and an IV.  Patient states that this is upsetting to her because she has "never had to deal with this before."  She is concerned that she will be unable to manage him.  Apparently another family member has talked with the treatment team, and encouraged them to admit him to rehab.  Apparently the husband has not yet been discharge.  The patient is here with some other family members were with her when she took the phone call earlier today.  At that time the patient was visibly anxious, her head rolled back and her eyes rolled to the top of her head, for just a few seconds.  She did not fall out of the chair or have syncope.  Apparently family members called EMS who arrived and found her blood sugar was "70."  After this, a family member gave her a Coca-Cola, and she started to feel better.  She arrives here today, by private vehicle.  She denies recent fever, chills, nausea, vomiting, dysuria, urinary frequency, abdominal or back pain.  She has chronic constipation for which she takes Linzess.  Family members report that she has been weaker than usual, for several weeks.  She was hospitalized 11 days ago with congestive heart failure, had a right-sided thoracentesis, and was discharged on Demadex, and instructed to take 2 a day, and a third if her weight gain was greater than 3 pounds.  She is due to get an iron infusion, tomorrow.  There are no other known modifying factors.  HPI  Past Medical History:    Diagnosis Date  . Acute renal failure superimposed on stage 4 chronic kidney disease ([place]) 07/04/2015  . Allergic rhinitis   . Anemia   . Anxiety   . CAD in native artery    NSTEMI 07/2015 s/p DES to RCA and posterior PDA, PCI 10/2015 with scoring balloon to 85% ISR of distal RCA), known LAD/Cx disease treated medically  . Carotid artery disease ([place])    Mild bilateral carotid disease (1-39% 07/2015)  . Chronic combined systolic and diastolic CHF (congestive heart failure) ([place])   . CKD (chronic kidney disease), stage IV ([place])   . Constipation   . Diabetes mellitus   . GERD (gastroesophageal reflux disease)   . Heart murmur, systolic   . History of hysterectomy   . Hyperlipidemia   . Hypertension   . Low back pain   . Obesity   . 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 Osteoarthritis   . Overactive bladder   . PVC's (premature ventricular contractions)     Patient Active Problem List   Diagnosis Date Noted  . New onset a-fib ([place]) 11/10/2016  . Pleural effusion on right 10/31/2016  . HCAP (healthcare-associated pneumonia) 10/31/2016  . DM type 2 causing vascular disease ([place]) 10/05/2016  . Personal history of noncompliance with medical treatment, presenting hazards to health 08/22/2016  . Hyperglycemia  08/07/2016  . Pulmonary edema 07/14/2016  . CKD (chronic kidney disease), stage IV ([place]) 07/14/2016  . Acute respiratory failure with hypoxia ([place]) 07/09/2016  . Acute on chronic systolic CHF (congestive heart failure) ([place]) 12/01/2015  . AKI (acute kidney injury) ([place])   . Ischemia of upper extremity   . Right knee pain   . Subclavian artery stenosis, right ([place]) 07/07/2015  . Acute on chronic combined systolic and diastolic CHF (congestive heart failure) ([place]) 07/04/2015  . Elevated troponin 07/04/2015  . NSTEMI (non-ST elevated myocardial infarction) ([place]) 07/04/2015  . Elevated d-dimer  07/04/2015  . Acute respiratory failure with hypercapnia ([place])   . Bilateral lower extremity edema 01/20/2012  . Type 2 diabetes mellitus with stage 4 chronic kidney disease, with long-term current use of insulin ([place]) 01/21/2011  . Overweight 07/20/2009  . Atherosclerosis of native coronary artery of native heart with unstable angina pectoris ([place]) 10/08/2008  . HEART MURMUR, SYSTOLIC 62/22/9798  . SHOULDER PAIN 02/05/2007  . Hyperlipemia 04/11/2006  . ANXIETY 04/11/2006  . SYNDROME, CARPAL TUNNEL 04/11/2006  . Essential hypertension 04/11/2006  . ALLERGIC RHINITIS 04/11/2006  . GERD 04/11/2006  . Constipation 04/11/2006  . OVERACTIVE BLADDER 04/11/2006  . OSTEOARTHRITIS 04/11/2006  . LOW BACK PAIN 04/11/2006    Past Surgical History:  Procedure Laterality Date  . ABDOMINAL HYSTERECTOMY    . BACK SURGERY     multiple  . CARDIAC CATHETERIZATION  04/04/2006   Est EF of 60%  . CARDIAC CATHETERIZATION N/A 07/04/2015   Procedure: Left Heart Cath and Coronary Angiography;  Surgeon: Troy Sine, MD;  Location: [facility];  Service: Cardiovascular;  Laterality: N/A;  . CARDIAC CATHETERIZATION N/A 07/04/2015   Procedure: Coronary Stent Intervention;  Surgeon: Troy Sine, MD;  Location: [facility];  Service: Cardiovascular;  Laterality: N/A;  . CARDIAC CATHETERIZATION N/A 10/13/2015   Procedure: Left Heart Cath and Coronary Angiography;  Surgeon: Troy Sine, MD;  Location: [facility];  Service: Cardiovascular;  Laterality: N/A;  . CARDIAC CATHETERIZATION N/A 12/02/2015   Procedure: Left Heart Cath and Coronary Angiography;  Surgeon: Peter M Martinique, MD;  Location: [facility];  Service: Cardiovascular;  Laterality: N/A;  . CARPAL TUNNEL RELEASE    . CERVICAL BIOPSY     cervical lymph node biopsies  . COLONOSCOPY  May 2002   Dr. Irving Shows :Followup in 5 years, normal exam  . COLONOSCOPY  2008   Dr. Laural Golden: Very redundant colon with mild melanosis  coli, splenic flexure polyp biopsy with acute complaint of benign colon polyp. Recommended ten-year followup  . CORONARY STENT PLACEMENT  04/11/2006   2 -- Taxus stents to the circumflex   . PERIPHERAL VASCULAR CATHETERIZATION Right 07/09/2015   Procedure: Upper Extremity Angiography;  Surgeon: Conrad Ivalee, MD;  Location: [facility];  Service: Cardiovascular;  Laterality: Right;  . PERIPHERAL VASCULAR CATHETERIZATION Right 07/10/2015   Procedure: RIGHT SUBCLAVIAN ARTERY THROMBECTOMY;  Surgeon: Serafina Mitchell, MD;  Location: [facility];  Service: Vascular;  Laterality: Right;  . tendonitis     bilateral elbow  . TRIGGER FINGER RELEASE      OB History    No data available       Home Medications    Prior to Admission medications   Medication Sig Start Date End Date Taking? Authorizing Provider  acetaminophen (TYLENOL) 500 MG tablet Take 2 tablets (1,000 mg total) by mouth every 6 (six) hours as needed (pain).  07/12/15  Yes Lily Kocher, MD  albuterol (PROVENTIL HFA;VENTOLIN HFA) 108 (90 Base) MCG/ACT inhaler Inhale 1-2 puffs into the lungs every 6 (six) hours as needed for wheezing or shortness of breath. 04/01/16  Yes Nanavati, Ankit, MD  albuterol (PROVENTIL) (2.5 MG/3ML) 0.083% nebulizer solution Take 3 mLs (2.5 mg total) by nebulization every 6 (six) hours as needed for wheezing or shortness of breath. 07/16/16  Yes Elgergawy, Silver Huguenin, MD  aspirin EC 81 MG tablet Take 81 mg by mouth daily.   Yes [provider]  Continuous Blood Gluc Sensor ([place]) MISC Use one sensor every 10 days for continuous glucose monitoring. 10/05/16  Yes Nida, Marella Chimes, MD  cyclobenzaprine (FLEXERIL) 5 MG tablet Take 5 mg by mouth at bedtime. 10/03/16  Yes [provider]  diazepam (VALIUM) 5 MG tablet Take 1 tablet (5 mg total) by mouth every 12 (twelve) hours as needed for muscle spasms. 10/29/16  Yes Julianne Rice, MD  ferrous sulfate 325 (65 FE) MG tablet Take  325 mg by mouth daily with breakfast.    Yes [provider]  fluticasone (FLONASE) 50 MCG/ACT nasal spray Place 1 spray into both nostrils daily.   Yes [provider]  hydrALAZINE (APRESOLINE) 25 MG tablet Take 2 tablets (50 mg total) by mouth 3 (three) times daily. 01/21/16  Yes Herminio Commons, MD  HYDROcodone-homatropine (HYCODAN) 5-1.5 MG/5ML syrup Take 5 mLs by mouth every 6 (six) hours as needed for cough.  08/30/16  Yes [provider]  insulin degludec (TRESIBA) 100 UNIT/ML SOPN FlexTouch Pen Inject 40 Units into the skin at bedtime.    Yes [provider]  insulin lispro (HUMALOG KWIKPEN) 100 UNIT/ML KiwkPen INJECT 18 TO 24 UNITS TOTAL INTO THE SKIN THREE TIMES DAILY 10/05/16  Yes Nida, Marella Chimes, MD  isosorbide mononitrate (IMDUR) 60 MG 24 hr tablet Take 1 tablet (60 mg total) by mouth daily. 07/12/15  Yes Lily Kocher, MD  latanoprost (XALATAN) 0.005 % ophthalmic solution Place 1 drop into both eyes at bedtime.    Yes [provider]  meclizine (ANTIVERT) 25 MG tablet Take 12.5 mg by mouth 2 (two) times daily.    Yes [provider]  metoprolol succinate (TOPROL-XL) 50 MG 24 hr tablet TAKE ONE TABLET BY MOUTH TWICE DAILY WITH  OR  IMMEDIATELY  FOLLOWING  A  MEAL 04/28/16  Yes Lyda Jester M, PA-C  Multiple Vitamin (MULTIVITAMIN WITH MINERALS) TABS tablet Take 1 tablet by mouth daily.   Yes [provider]  nitroGLYCERIN (NITROSTAT) 0.4 MG SL tablet Place 1 tablet (0.4 mg total) under the tongue every 5 (five) minutes as needed for chest pain. 10/14/15  Yes Simmons, Brittainy M, PA-C  pantoprazole (PROTONIX) 40 MG tablet Take 40 mg by mouth daily.    Yes [provider]  potassium chloride SA (K-DUR,KLOR-CON) 20 MEQ tablet Take 2 tablets (40 mEq total) by mouth daily. 10/14/15  Yes Simmons, Brittainy M, PA-C  RANEXA 500 MG 12 hr tablet TAKE ONE TABLET BY MOUTH TWICE DAILY 05/04/16  Yes Troy Sine, MD    rosuvastatin (CRESTOR) 40 MG tablet Take 1 tablet (40 mg total) by mouth at bedtime. 07/12/15  Yes Lily Kocher, MD  ticagrelor (BRILINTA) 90 MG TABS tablet Take 1 tablet (90 mg total) by mouth 2 (two) times daily. 10/14/15  Yes Rosita Fire, Brittainy M, PA-C  torsemide (DEMADEX) 20 MG tablet Take 60 mg by mouth 2 (two) times daily. Pt is able  to take an additional tablet daily for weight gain greater than 3lbs.   Yes [provider]  trolamine salicylate (ASPERCREME) 10 % cream Apply 1 application topically as needed for muscle pain.   Yes [provider]    Family History Family History  Problem Relation Age of Onset  . Diabetes Father   . Hypertension Father   . Stroke Father   . Hypertension Brother   . Aneurysm Brother   . Diabetes Brother   . Colon cancer Neg Hx     Social History Social History  Substance Use Topics  . Smoking status: Never Smoker  . Smokeless tobacco: Never Used  . Alcohol use No     Allergies   Ace inhibitors; Amlodipine; and Codeine   Review of Systems Review of Systems  All other systems reviewed and are negative.    Physical Exam Updated Vital Signs BP (!) 110/58 (BP Location: Left Arm)   Pulse 70   Temp 98.5 F (36.9 C) (Oral)   Resp 18   Ht 5\' 8"  (1.727 m)   Wt 91.2 kg (201 lb)   SpO2 100%   BMI 30.56 kg/m   Physical Exam  Constitutional: She is oriented to person, place, and time. She appears well-developed. No distress.  Elderly, frail  HENT:  Head: Normocephalic and atraumatic.  Eyes: Pupils are equal, round, and reactive to light. Conjunctivae and EOM are normal.  Neck: Normal range of motion and phonation normal. Neck supple.  Cardiovascular: Regular rhythm.   Tachycardic  Pulmonary/Chest: Effort normal and breath sounds normal. No respiratory distress. She exhibits no tenderness.  Abdominal: Soft. She exhibits no distension. There is no tenderness. There is no guarding.  Musculoskeletal: Normal range of  motion. She exhibits no edema or deformity.  Neurological: She is alert and oriented to person, place, and time. She exhibits normal muscle tone.  Skin: Skin is warm and dry.  Psychiatric: She has a normal mood and affect. Her behavior is normal. Judgment and thought content normal.  Nursing note and vitals reviewed.    ED Treatments / Results  Labs (all labs ordered are listed, but only abnormal results are displayed) Labs Reviewed  BASIC METABOLIC PANEL - Abnormal; Notable for the following:       Result Value   Chloride 95 (*)    Glucose, Bld 215 (*)    BUN 65 (*)    Creatinine, Ser 2.82 (*)    GFR calc non Af Amer 16 (*)    GFR calc Af Amer 18 (*)    All other components within normal limits  CBC - Abnormal; Notable for the following:    RBC 2.80 (*)    Hemoglobin 8.8 (*)    HCT 27.5 (*)    RDW 16.2 (*)    All other components within normal limits  URINALYSIS, ROUTINE W REFLEX MICROSCOPIC - Abnormal; Notable for the following:    APPearance CLOUDY (*)    Protein, ur 100 (*)    Leukocytes, UA MODERATE (*)    Bacteria, UA RARE (*)    Squamous Epithelial / LPF 6-30 (*)    All other components within normal limits  CBG MONITORING, ED - Abnormal; Notable for the following:    Glucose-Capillary 194 (*)    All other components within normal limits  URINE CULTURE   BUN  Date Value Ref Range Status  11/10/2016 65 (H) 6 - 20 mg/dL Final  11/01/2016 46 (H) 6 - 20 mg/dL Final  10/31/2016 52 (H) 6 - 20 mg/dL Final  08/08/2016 54 (H) 6 - 20 mg/dL Final  02/18/2016 53 (H) 8 - 27 mg/dL Final  09/25/2015 42 (H) 8 - 27 mg/dL Final  06/23/2015 67 (H) 8 - 27 mg/dL Final  03/10/2015 90 (HH) 8 - 27 mg/dL Final   Creat  Date Value Ref Range Status  10/08/2013 1.73 (H) 0.50 - 1.10 mg/dL Final  09/23/2013 2.23 (H) 0.50 - 1.10 mg/dL Final   Creatinine, Ser  Date Value Ref Range Status  11/10/2016 2.82 (H) 0.44 - 1.00 mg/dL Final  11/01/2016 1.68 (H) 0.44 - 1.00 mg/dL Final    10/31/2016 2.06 (H) 0.44 - 1.00 mg/dL Final  08/08/2016 1.78 (H) 0.44 - 1.00 mg/dL Final   Hemoglobin  Date Value Ref Range Status  11/10/2016 8.8 (L) 12.0 - 15.0 g/dL Final  11/01/2016 9.4 (L) 12.0 - 15.0 g/dL Final  10/31/2016 9.7 (L) 12.0 - 15.0 g/dL Final  08/08/2016 10.5 (L) 12.0 - 15.0 g/dL Final     EKG  EKG Interpretation  Date/Time:  Thursday November 10 2016 18:55:32 EDT Ventricular Rate:  119 PR Interval:    QRS Duration: 110 QT Interval:  347 QTC Calculation: 489 R Axis:   -25 Text Interpretation:  Atrial fibrillation Borderline left axis deviation Low voltage, precordial leads Nonspecific repol abnormality, diffuse leads Since last tracing Atrial fibrillation is new Confirmed by Daleen Bo [phone]) on 11/10/2016 8:45:56 PM      This patients CHA2DS2-VASc Score and unadjusted Ischemic Stroke Rate (% per year) is equal to 9.7 % stroke rate/year from a score of 6  Above score calculated as 1 point each if present [CHF, HTN, DM, Vascular=MI/PAD/Aortic Plaque, Age if 65-74, or Female] Above score calculated as 2 points each if present [Age > 75, or Stroke/TIA/TE]     Radiology Dg Chest Port 1 View  Result Date: 11/10/2016 CLINICAL DATA:  Shortness of breath tonight EXAM: PORTABLE CHEST 1 VIEW COMPARISON:  October 31, 2016 FINDINGS: The mediastinal contour is normal. The heart size is mildly enlarged. There is a small right pleural effusion with patchy consolidation of right lung base. The left lung is clear. No acute abnormalities identified in the visualized bones. IMPRESSION: Small right pleural effusion with patchy consolidation of right lung base, underline pneumonia is not excluded. Electronically Signed   By: Abelardo Diesel M.D.   On: 11/10/2016 21:28    Procedures Procedures (including critical care time)  Medications Ordered in ED Medications  diltiazem (CARDIZEM) 1 mg/mL load via infusion 10 mg (10 mg Intravenous Bolus from Bag 11/10/16 2201)    And   diltiazem (CARDIZEM) 100 mg in dextrose 5% 120mL (1 mg/mL) infusion (5 mg/hr Intravenous New Bag/Given 11/10/16 2202)  sodium chloride 0.9 % bolus 500 mL (0 mLs Intravenous Stopped 11/10/16 2009)  0.9 %  sodium chloride infusion (1,000 mLs Intravenous New Bag/Given 11/10/16 2111)     Initial Impression / Assessment and Plan / ED Course  I have reviewed the triage vital signs and the nursing notes.  Pertinent labs & imaging results that were available during my care of the patient were reviewed by me and considered in my medical decision making (see chart for details).  Clinical Course as of Nov 11 2339  Thu Nov 10, 2016  2100 MCV: 98.2 [EW]    Clinical Course User Index [EW] Daleen Bo, MD     Patient Vitals for the past 24 hrs:  BP Temp Temp src Pulse Resp SpO2  Height Weight  11/10/16 2309 (!) 110/58 - - 70 18 100 % - -  11/10/16 2234 93/64 - - (!) 101 11 100 % - -  11/10/16 2200 100/69 - - 97 - 100 % - -  11/10/16 2015 - - - - 15 - - -  11/10/16 2002 93/82 - - 100 16 100 % - -  11/10/16 2000 103/63 - - - - - - -  11/10/16 1842 (!) 107/59 98.5 F (36.9 C) Oral (!) 106 18 99 % 5\' 8"  (1.727 m) 91.2 kg (201 lb)  11/10/16 1841 - 98.5 F (36.9 C) Oral - - - - -    8:50 PM Reevaluation with update and discussion. After initial assessment and treatment, an updated evaluation reveals she remains comfortable and has no further complaints.Daleen Bo L   9:32 PM-Consult complete with hospitalist . Patient case explained and discussed.  She agrees to admit patient for further evaluation and treatment. Call ended at 21: 48  CRITICAL CARE Performed by: Daleen Bo L Total critical care time: 40 minutes Critical care time was exclusive of separately billable procedures and treating other patients. Critical care was necessary to treat or prevent imminent or life-threatening deterioration. Critical care was time spent personally by me on the following activities: development of  treatment plan with patient and/or surrogate as well as nursing, discussions with consultants, evaluation of patient's response to treatment, examination of patient, obtaining history from patient or surrogate, ordering and performing treatments and interventions, ordering and review of laboratory studies, ordering and review of radiographic studies, pulse oximetry and re-evaluation of patient's condition.   Final Clinical Impressions(s) / ED Diagnoses   Final diagnoses:  Weakness  Atrial fibrillation, new onset (HCC)  AKI (acute kidney injury) ([place])  Hypokalemia  Anemia in chronic kidney disease, unspecified CKD stage  Renal insufficiency   Nonspecific weakness, ongoing, likely associated with new onset atrial fibrillation.  Recent congestive heart failure exacerbation, with bilateral pleural effusions, she has had some reaccumulation of the right side pleural effusion.  Doubt pneumonia.  No ongoing cough or fever.  Incidental abnormal urine without symptoms of urinary tract infection, urine culture ordered.  Suspect multifactorial ulcers contributing to atrial fibrillation.  Chads score is elevated, almost 10%, she will require anticoagulation.   Nursing Notes Reviewed/ Care Coordinated Applicable Imaging Reviewed Interpretation of Laboratory Data incorporated into ED treatment  Plan: Admit  New Prescriptions New Prescriptions   No medications on file     Daleen Bo, MD 11/10/16 2342

## 2016-11-10 NOTE — ED Notes (Signed)
Pt called out and told nurse tech she fells SOB. 98% O2 sats. Pt was placed on O2 for comfort.

## 2016-11-10 NOTE — ED Notes (Signed)
cbg in triage 194.

## 2016-11-11 ENCOUNTER — Encounter (HOSPITAL_COMMUNITY)
Admission: RE | Admit: 2016-11-11 | Discharge: 2016-11-11 | Disposition: A | Discharge status: Home / Self Care | Payer: BLUE CROSS/BLUE SHIELD | Source: Ambulatory Visit | Attending: Nephrology | Admitting: Nephrology

## 2016-11-11 ENCOUNTER — Observation Stay (HOSPITAL_BASED_OUTPATIENT_CLINIC_OR_DEPARTMENT_OTHER): Payer: BLUE CROSS/BLUE SHIELD

## 2016-11-11 DIAGNOSIS — E162 Hypoglycemia, unspecified: Secondary | ICD-10-CM | POA: Diagnosis not present

## 2016-11-11 DIAGNOSIS — F411 Generalized anxiety disorder: Secondary | ICD-10-CM | POA: Diagnosis not present

## 2016-11-11 DIAGNOSIS — N184 Chronic kidney disease, stage 4 (severe): Secondary | ICD-10-CM | POA: Diagnosis not present

## 2016-11-11 DIAGNOSIS — I340 Nonrheumatic mitral (valve) insufficiency: Secondary | ICD-10-CM

## 2016-11-11 DIAGNOSIS — N179 Acute kidney failure, unspecified: Secondary | ICD-10-CM | POA: Diagnosis not present

## 2016-11-11 DIAGNOSIS — N189 Chronic kidney disease, unspecified: Secondary | ICD-10-CM

## 2016-11-11 DIAGNOSIS — I4891 Unspecified atrial fibrillation: Secondary | ICD-10-CM

## 2016-11-11 DIAGNOSIS — D631 Anemia in chronic kidney disease: Secondary | ICD-10-CM | POA: Diagnosis not present

## 2016-11-11 LAB — ECHOCARDIOGRAM COMPLETE
CHL CUP DOP CALC LVOT VTI: 10.7 cm
EERAT: 27.3
EWDT: 148 ms
FS: 15 % — AB (ref 28–44)
HEIGHTINCHES: 68 in
IV/PV OW: 1.39
LA ID, A-P, ES: 35 mm
LA diam index: 1.64 cm/m2
LA vol A4C: 95.1 ml
LA vol index: 46 mL/m2
LA vol: 98 mL
LEFT ATRIUM END SYS DIAM: 35 mm
LV E/e'average: 27.3
LV PW d: 10.1 mm — AB (ref 0.6–1.1)
LV SIMPSON'S DISK: 38
LV TDI E'LATERAL: 3.59
LV dias vol index: 43 mL/m2
LV dias vol: 92 mL (ref 46–106)
LV e' LATERAL: 3.59 cm/s
LV sys vol index: 26 mL/m2
LVEEMED: 27.3
LVOT SV: 34 mL
LVOT area: 3.14 cm2
LVOT diameter: 20 mm
LVOT peak grad rest: 1 mmHg
LVOT peak vel: 50.2 cm/s
LVSYSVOL: 56 mL — AB (ref 14–42)
MV Dec: 148
MV Peak grad: 4 mmHg
MVPKAVEL: 80.5 m/s
MVPKEVEL: 98 m/s
RV LATERAL S' VELOCITY: 10.7 cm/s
RV sys press: 44 mmHg
Reg peak vel: 271 cm/s
Stroke v: 35 ml
TAPSE: 17.5 mm
TDI e' medial: 4.03
TRMAXVEL: 271 cm/s
Weight: 3252.23 oz

## 2016-11-11 LAB — GLUCOSE, CAPILLARY
GLUCOSE-CAPILLARY: 196 mg/dL — AB (ref 65–99)
GLUCOSE-CAPILLARY: 234 mg/dL — AB (ref 65–99)
GLUCOSE-CAPILLARY: 250 mg/dL — AB (ref 65–99)
GLUCOSE-CAPILLARY: 375 mg/dL — AB (ref 65–99)
Glucose-Capillary: 300 mg/dL — ABNORMAL HIGH (ref 65–99)

## 2016-11-11 LAB — BASIC METABOLIC PANEL
Anion gap: 11 (ref 5–15)
BUN: 66 mg/dL — AB (ref 6–20)
CO2: 27 mmol/L (ref 22–32)
Calcium: 8.9 mg/dL (ref 8.9–10.3)
Chloride: 98 mmol/L — ABNORMAL LOW (ref 101–111)
Creatinine, Ser: 2.62 mg/dL — ABNORMAL HIGH (ref 0.44–1.00)
GFR, EST AFRICAN AMERICAN: 20 mL/min — AB (ref >60)
GFR, EST NON AFRICAN AMERICAN: 17 mL/min — AB (ref >60)
Glucose, Bld: 172 mg/dL — ABNORMAL HIGH (ref 65–99)
POTASSIUM: 3.7 mmol/L (ref 3.5–5.1)
SODIUM: 136 mmol/L (ref 135–145)

## 2016-11-11 LAB — CBC
HEMATOCRIT: 26.3 % — AB (ref 36.0–46.0)
Hemoglobin: 8.4 g/dL — ABNORMAL LOW (ref 12.0–15.0)
MCH: 30.8 pg (ref 26.0–34.0)
MCHC: 31.9 g/dL (ref 30.0–36.0)
MCV: 96.3 fL (ref 78.0–100.0)
Platelets: 311 10*3/uL (ref 150–400)
RBC: 2.73 MIL/uL — AB (ref 3.87–5.11)
RDW: 16.4 % — AB (ref 11.5–15.5)
WBC: 5.5 10*3/uL (ref 4.0–10.5)

## 2016-11-11 LAB — MRSA PCR SCREENING: MRSA by PCR: NEGATIVE

## 2016-11-11 LAB — MAGNESIUM: Magnesium: 2.2 mg/dL (ref 1.7–2.4)

## 2016-11-11 MED ORDER — BISACODYL 5 MG PO TBEC: 5.0000 mg | DELAYED_RELEASE_TABLET | Freq: Every day | ORAL | Status: DC | PRN

## 2016-11-11 MED ORDER — TORSEMIDE 20 MG PO TABS
20.0000 mg | ORAL_TABLET | Freq: Two times a day (BID) | ORAL | Status: DC
  Administered 2016-11-11 – 2016-11-12 (×2): 20 mg via ORAL
  Filled 2016-11-11 (×2): qty 1

## 2016-11-11 MED ORDER — INSULIN ASPART 100 UNIT/ML ~~LOC~~ SOLN
0.0000 [IU] | Freq: Three times a day (TID) | SUBCUTANEOUS | Status: DC
  Administered 2016-11-11: 9 [IU] via SUBCUTANEOUS
  Administered 2016-11-11: 3 [IU] via SUBCUTANEOUS
  Administered 2016-11-11 – 2016-11-12 (×2): 2 [IU] via SUBCUTANEOUS
  Administered 2016-11-12: 3 [IU] via SUBCUTANEOUS
  Administered 2016-11-12: 2 [IU] via SUBCUTANEOUS
  Administered 2016-11-13 (×2): 3 [IU] via SUBCUTANEOUS
  Administered 2016-11-14: 2 [IU] via SUBCUTANEOUS
  Administered 2016-11-14: 5 [IU] via SUBCUTANEOUS
  Administered 2016-11-14: 3 [IU] via SUBCUTANEOUS
  Administered 2016-11-15 – 2016-11-16 (×4): 2 [IU] via SUBCUTANEOUS
  Administered 2016-11-16: 3 [IU] via SUBCUTANEOUS

## 2016-11-11 MED ORDER — INSULIN GLARGINE 100 UNIT/ML ~~LOC~~ SOLN
20.0000 [IU] | Freq: Every day | SUBCUTANEOUS | Status: DC
  Administered 2016-11-11 (×2): 20 [IU] via SUBCUTANEOUS
  Filled 2016-11-11 (×4): qty 0.2

## 2016-11-11 MED ORDER — RIVAROXABAN 15 MG PO TABS
15.0000 mg | ORAL_TABLET | Freq: Once | ORAL | Status: AC
  Administered 2016-11-11: 15 mg via ORAL
  Filled 2016-11-11: qty 1

## 2016-11-11 MED ORDER — SODIUM CHLORIDE 0.9 % IV SOLN
125.0000 mg | Freq: Once | INTRAVENOUS | Status: AC
  Administered 2016-11-11: 125 mg via INTRAVENOUS
  Filled 2016-11-11: qty 10

## 2016-11-11 MED ORDER — POLYETHYLENE GLYCOL 3350 17 G PO PACK
17.0000 g | PACK | Freq: Two times a day (BID) | ORAL | Status: DC
  Administered 2016-11-11 – 2016-11-16 (×9): 17 g via ORAL
  Filled 2016-11-11 (×10): qty 1

## 2016-11-11 MED ORDER — DOCUSATE SODIUM 100 MG PO CAPS
100.0000 mg | ORAL_CAPSULE | Freq: Every day | ORAL | Status: DC | PRN
  Administered 2016-11-11: 100 mg via ORAL
  Filled 2016-11-11: qty 1

## 2016-11-11 MED ORDER — RIVAROXABAN 15 MG PO TABS
15.0000 mg | ORAL_TABLET | Freq: Every day | ORAL | Status: DC
  Administered 2016-11-11 – 2016-11-13 (×3): 15 mg via ORAL
  Filled 2016-11-11 (×3): qty 1

## 2016-11-11 MED ORDER — INSULIN ASPART 100 UNIT/ML ~~LOC~~ SOLN
0.0000 [IU] | Freq: Every day | SUBCUTANEOUS | Status: DC
  Administered 2016-11-11: 2 [IU] via SUBCUTANEOUS
  Administered 2016-11-11: 3 [IU] via SUBCUTANEOUS
  Administered 2016-11-13: 2 [IU] via SUBCUTANEOUS
  Administered 2016-11-14: 4 [IU] via SUBCUTANEOUS
  Administered 2016-11-15: 3 [IU] via SUBCUTANEOUS

## 2016-11-11 MED ORDER — ALPRAZOLAM 0.25 MG PO TABS
0.2500 mg | ORAL_TABLET | Freq: Once | ORAL | Status: AC
  Administered 2016-11-11: 0.25 mg via ORAL
  Filled 2016-11-11 (×2): qty 1

## 2016-11-11 NOTE — Progress Notes (Signed)
ANTICOAGULATION CONSULT NOTE - Preliminary  Pharmacy Consult for rivaroxaban Indication: Nonvalvular atrial fibrillation  Allergies  Allergen Reactions  . Ace Inhibitors Cough  . Amlodipine     Edema   . Codeine Rash    Patient Measurements: Height: 5\' 8"  (172.7 cm) Weight: 203 lb 4.2 oz (92.2 kg) IBW/kg (Calculated) : 63.9 HEPARIN DW (KG): 83.6   Vital Signs: Temp: 99.4 F (37.4 C) (09/07 0000) Temp Source: Oral (09/07 0000) BP: 125/106 (09/07 0043) Pulse Rate: 80 (09/07 0043)  Labs:  Recent Labs  11/10/16 1902  HGB 8.8*  HCT 27.5*  PLT 341  CREATININE 2.82*   Estimated Creatinine Clearance: 21.7 mL/min (A) (by C-G formula based on SCr of 2.82 mg/dL (H)).  Medical History: Past Medical History:  Diagnosis Date  . Acute renal failure superimposed on stage 4 chronic kidney disease ([place]) 07/04/2015  . Allergic rhinitis   . Anemia   . Anxiety   . CAD in native artery    NSTEMI 07/2015 s/p DES to RCA and posterior PDA, PCI 10/2015 with scoring balloon to 85% ISR of distal RCA), known LAD/Cx disease treated medically  . Carotid artery disease ([place])    Mild bilateral carotid disease (1-39% 07/2015)  . Chronic combined systolic and diastolic CHF (congestive heart failure) ([place])   . CKD (chronic kidney disease), stage IV ([place])   . Constipation   . Diabetes mellitus   . GERD (gastroesophageal reflux disease)   . Heart murmur, systolic   . History of hysterectomy   . Hyperlipidemia   . Hypertension   . Low back pain   . Obesity   . 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 Osteoarthritis   . Overactive bladder   . PVC's (premature ventricular contractions)     Medications:   Assessment: 71 yo female with significant cardiac history including CHF, CAD s/p PCI 2018; CKD stage 4; now with new onset Afib (nonvalvular). Pharmacy has been asked to dose rivaroxaban.  Goal of  Therapy:  VTE prophylaxis  Plan:  Rivaroxaban 15 mg PO, first dose Stat Preliminary review of pertinent patient information completed.  Forestine Na clinical pharmacist will complete review during morning rounds to assess the patient and finalize treatment regimen.  Norberto Sorenson, [facility] 11/11/2016,1:31 AM

## 2016-11-11 NOTE — Discharge Instructions (Signed)

## 2016-11-11 NOTE — Progress Notes (Signed)
*  PRELIMINARY RESULTS* Echocardiogram 2D Echocardiogram has been performed.  [name] 11/11/2016, 4:41 PM

## 2016-11-11 NOTE — Progress Notes (Signed)
Per pt SOB with exertion. Will continue to monitor and pass information onto night shift RN

## 2016-11-11 NOTE — Progress Notes (Addendum)
Patient seen and examined  71 y.o. female with medical history significant of diastolic CHF, CKD baseline cr around 2, IDDM, CAD, anxiety comes in tonight after having an episode in the laundry mat tonight after receiving a phone call from [facility] about them discharging her husband. Patient started feeling anxious, weak. Daughter called 911. Sugar was 70. Patient was found to be in new onset atrial fibrillation with rapid ventricular response. Converted to normal sinus rhythm on Cardizem drip.  Recently hospitalized for large right-sided pleural effusion, consistent with transudative physiology, attributed it to her congestive heart failure.  Assessment and plan New onset a-fib ([place])- already converted to nsr with 2 hours of dilt drip.   off Cardizem drip. Currently on metoprolol. Also started on Xarelto. Pt had echo in 5/18 which did not show any valvular disease, EF 24%, diastolic dysfunction.  Will repeat to make sure no change but this was probably induced by anxiety/panic attack about issues with her husband as in HPI.  Mod /high risk for stroke.  UTI-doubt that the patient has a UTI, no symptoms, await urine culture      type 2 diabetes, presented with hypoglycemia, Hypoglycemia improved, continue lantus , SSI    Acute on chronic CKD, stage 3, baseline cr 2.00 , presented with a creatinine of 2.82, 2.62 today. Decrease dose of torsemide     Anxiety state- noted, continue Valium   Chronic combined CHF -Ejection fraction of 45-50%, euvolemic,  decrease dose of torsemide, given mild renal insufficiency, continue beta blocker  Subclavian artery occlusion -Status post thromboembolectomy, continue aspirin and Brillinta  Anemia of chronic disease Patient requested IV iron infusion as she was supposed to receive this as outpatient tomorrow

## 2016-11-11 NOTE — Care Management Note (Signed)
Case Management Note  Patient Details  Name: [name] MRN: [id number] Date of Birth: 03/29/1945  Subjective/Objective:   Adm with new onset Afib. From home, ind PTA. Started on Xarelto.               Action/Plan: CM gave discount coupon for Xarelto. Patient eligible for [facility]. Patient agreeable to EMMI DC calls.    Expected Discharge Date:       11/12/2016           Expected Discharge Plan:  Home/Self Care  In-House Referral:     Discharge planning Services  CM Consult, Medication Assistance  Post Acute Care Choice:  NA Choice offered to:  NA  DME Arranged:    DME Agency:     HH Arranged:    HH Agency:     Status of Service:  Completed, signed off  If discussed at [employer] of Stay Meetings, dates discussed:    Additional Comments:  Akelia Husted, Chauncey Reading, RN 11/11/2016, 1:38 PM

## 2016-11-11 NOTE — Progress Notes (Signed)
[name] for rivaroxaban Indication: Nonvalvular atrial fibrillation  Allergies  Allergen Reactions  . Ace Inhibitors Cough  . Amlodipine     Edema   . Codeine Rash   Patient Measurements: Height: 5\' 8"  (172.7 cm) Weight: 203 lb 4.2 oz (92.2 kg) IBW/kg (Calculated) : 63.9 HEPARIN DW (KG): 83.6  Vital Signs: Temp: 98.5 F (36.9 C) (09/07 0758) Temp Source: Oral (09/07 0758) BP: 137/78 (09/07 0900) Pulse Rate: 79 (09/07 0900)  Labs:  Recent Labs  11/10/16 1902 11/11/16 0530  HGB 8.8* 8.4*  HCT 27.5* 26.3*  PLT 341 311  CREATININE 2.82* 2.62*   Estimated Creatinine Clearance: 23.4 mL/min (A) (by C-G formula based on SCr of 2.62 mg/dL (H)).  Medical History: Past Medical History:  Diagnosis Date  . Acute renal failure superimposed on stage 4 chronic kidney disease ([place]) 07/04/2015  . Allergic rhinitis   . Anemia   . Anxiety   . CAD in native artery    NSTEMI 07/2015 s/p DES to RCA and posterior PDA, PCI 10/2015 with scoring balloon to 85% ISR of distal RCA), known LAD/Cx disease treated medically  . Carotid artery disease ([place])    Mild bilateral carotid disease (1-39% 07/2015)  . Chronic combined systolic and diastolic CHF (congestive heart failure) ([place])   . CKD (chronic kidney disease), stage IV ([place])   . Constipation   . Diabetes mellitus   . GERD (gastroesophageal reflux disease)   . Heart murmur, systolic   . History of hysterectomy   . Hyperlipidemia   . Hypertension   . Low back pain   . Obesity   . 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 Osteoarthritis   . Overactive bladder   . PVC's (premature ventricular contractions)    Medications:   Assessment: 71 yo female with significant cardiac history including CHF, CAD s/p PCI 2018; CKD stage 4; now with new onset Afib (nonvalvular). Pharmacy has been asked to dose rivaroxaban.   ClCr > 13ml/min  Goal of Therapy:  VTE prophylaxis  Plan:  Rivaroxaban 15 mg PO daily with supper Monitor SCR, CBC  Elin Seats A, RPH 11/11/2016,10:22 AM

## 2016-11-11 NOTE — H&P (Signed)
History and Physical    [name] WFU:[id number] DOB: 03-05-1946 DOA: 11/10/2016  PCP: [name], MD  Patient coming from: the laundry mat  Chief Complaint:   Dizzy, weak, feeling like gona pass out  HPI: [name] is a 71 y.o. female with medical history significant of diastolic CHF, CKD baseline cr around 2, IDDM, CAD, anxiety comes in tonight after having an episode in the laundry mat tonight after receiving a phone call from [facility] about them discharging her husband.  Apparently her [facility] is hospitalized [facility] with a heart attack and acute cholecystitis (sounds like being medically managed) and they called her today to tell her they were sending him home with an IV and a foley cath.  She absolutely started to panic and said there was no way she could take care of him at home like that.  After she got off the phone she was very anxious and she says she had a panic attack.  She got really faint and dizzy.  She was with her daughter who called 911.  Her sugar was 70.  She denies any cp or sob.  She feels back to normal now.  In the ED she was found to have new onset afib with RVR with rate in the 120s.  She was on a dilt drip and has already converted to NSR with rate in the 60s.  Her sugar has normalized with drinking juice.  She has not been eating well due to stress about her husband being in the hospital, he is now going to rehab.  She was recently d/ced from here over a week ago for HCAp, actually had a thoracentesis and has been doing well at home since her discharge.  Denies any swelling, fever or cough.  Review of Systems: As per HPI otherwise 10 point review of systems negative.   Past Medical History:  Diagnosis Date  . Acute renal failure superimposed on stage 4 chronic kidney disease ([place]) 07/04/2015  . Allergic rhinitis   . Anemia   . Anxiety   . CAD in native artery    NSTEMI 07/2015 s/p DES to RCA and posterior PDA, PCI 10/2015 with scoring  balloon to 85% ISR of distal RCA), known LAD/Cx disease treated medically  . Carotid artery disease ([place])    Mild bilateral carotid disease (1-39% 07/2015)  . Chronic combined systolic and diastolic CHF (congestive heart failure) ([place])   . CKD (chronic kidney disease), stage IV ([place])   . Constipation   . Diabetes mellitus   . GERD (gastroesophageal reflux disease)   . Heart murmur, systolic   . History of hysterectomy   . Hyperlipidemia   . Hypertension   . Low back pain   . Obesity   . 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 Osteoarthritis   . Overactive bladder   . PVC's (premature ventricular contractions)     Past Surgical History:  Procedure Laterality Date  . ABDOMINAL HYSTERECTOMY    . BACK SURGERY     multiple  . CARDIAC CATHETERIZATION  04/04/2006   Est EF of 60%  . CARDIAC CATHETERIZATION N/A 07/04/2015   Procedure: Left Heart Cath and Coronary Angiography;  Surgeon: Troy Sine, MD;  Location: [facility] CV LAB;  Service: Cardiovascular;  Laterality: N/A;  . CARDIAC CATHETERIZATION N/A 07/04/2015   Procedure: Coronary Stent Intervention;  Surgeon: Troy Sine, MD;  Location:  [place] INVASIVE CV LAB;  Service: Cardiovascular;  Laterality: N/A;  . CARDIAC CATHETERIZATION N/A 10/13/2015   Procedure: Left Heart Cath and Coronary Angiography;  Surgeon: Troy Sine, MD;  Location: [place] CV LAB;  Service: Cardiovascular;  Laterality: N/A;  . CARDIAC CATHETERIZATION N/A 12/02/2015   Procedure: Left Heart Cath and Coronary Angiography;  Surgeon: Peter M Martinique, MD;  Location: [facility] CV LAB;  Service: Cardiovascular;  Laterality: N/A;  . CARPAL TUNNEL RELEASE    . CERVICAL BIOPSY     cervical lymph node biopsies  . COLONOSCOPY  May 2002   Dr. Irving Shows :Followup in 5 years, normal exam  . COLONOSCOPY  2008   Dr. Laural Golden: Very redundant colon with mild melanosis coli, splenic  flexure polyp biopsy with acute complaint of benign colon polyp. Recommended ten-year followup  . CORONARY STENT PLACEMENT  04/11/2006   2 -- Taxus stents to the circumflex   . PERIPHERAL VASCULAR CATHETERIZATION Right 07/09/2015   Procedure: Upper Extremity Angiography;  Surgeon: Conrad Bear Grass, MD;  Location: [facility] CV LAB;  Service: Cardiovascular;  Laterality: Right;  . PERIPHERAL VASCULAR CATHETERIZATION Right 07/10/2015   Procedure: RIGHT SUBCLAVIAN ARTERY THROMBECTOMY;  Surgeon: Serafina Mitchell, MD;  Location: [facility] OR;  Service: Vascular;  Laterality: Right;  . tendonitis     bilateral elbow  . TRIGGER FINGER RELEASE       reports that she has never smoked. She has never used smokeless tobacco. She reports that she does not drink alcohol or use drugs.  Allergies  Allergen Reactions  . Ace Inhibitors Cough  . Amlodipine     Edema   . Codeine Rash    Family History  Problem Relation Age of Onset  . Diabetes Father   . Hypertension Father   . Stroke Father   . Hypertension Brother   . Aneurysm Brother   . Diabetes Brother   . Colon cancer Neg Hx     Prior to Admission medications   Medication Sig Start Date End Date Taking? Authorizing Provider  acetaminophen (TYLENOL) 500 MG tablet Take 2 tablets (1,000 mg total) by mouth every 6 (six) hours as needed (pain). 07/12/15  Yes Lily Kocher, MD  albuterol (PROVENTIL HFA;VENTOLIN HFA) 108 (90 Base) MCG/ACT inhaler Inhale 1-2 puffs into the lungs every 6 (six) hours as needed for wheezing or shortness of breath. 04/01/16  Yes Nanavati, Ankit, MD  albuterol (PROVENTIL) (2.5 MG/3ML) 0.083% nebulizer solution Take 3 mLs (2.5 mg total) by nebulization every 6 (six) hours as needed for wheezing or shortness of breath. 07/16/16  Yes Elgergawy, Silver Huguenin, MD  aspirin EC 81 MG tablet Take 81 mg by mouth daily.   Yes [provider]  Continuous Blood Gluc Sensor ([place]) MISC Use one sensor every 10 days for  continuous glucose monitoring. 10/05/16  Yes Nida, Marella Chimes, MD  cyclobenzaprine (FLEXERIL) 5 MG tablet Take 5 mg by mouth at bedtime. 10/03/16  Yes [provider]  diazepam (VALIUM) 5 MG tablet Take 1 tablet (5 mg total) by mouth every 12 (twelve) hours as needed for muscle spasms. 10/29/16  Yes Julianne Rice, MD  ferrous sulfate 325 (65 FE) MG tablet Take 325 mg by mouth daily with breakfast.    Yes [provider]  fluticasone (FLONASE) 50 MCG/ACT nasal spray Place 1 spray into both nostrils daily.   Yes [provider]  hydrALAZINE (APRESOLINE) 25 MG tablet Take 2 tablets (50 mg total) by mouth  3 (three) times daily. 01/21/16  Yes Herminio Commons, MD  HYDROcodone-homatropine (HYCODAN) 5-1.5 MG/5ML syrup Take 5 mLs by mouth every 6 (six) hours as needed for cough.  08/30/16  Yes [provider]  insulin degludec (TRESIBA) 100 UNIT/ML SOPN FlexTouch Pen Inject 40 Units into the skin at bedtime.    Yes [provider]  insulin lispro (HUMALOG KWIKPEN) 100 UNIT/ML KiwkPen INJECT 18 TO 24 UNITS TOTAL INTO THE SKIN THREE TIMES DAILY 10/05/16  Yes Nida, Marella Chimes, MD  isosorbide mononitrate (IMDUR) 60 MG 24 hr tablet Take 1 tablet (60 mg total) by mouth daily. 07/12/15  Yes Lily Kocher, MD  latanoprost (XALATAN) 0.005 % ophthalmic solution Place 1 drop into both eyes at bedtime.    Yes [provider]  meclizine (ANTIVERT) 25 MG tablet Take 12.5 mg by mouth 2 (two) times daily.    Yes [provider]  metoprolol succinate (TOPROL-XL) 50 MG 24 hr tablet TAKE ONE TABLET BY MOUTH TWICE DAILY WITH  OR  IMMEDIATELY  FOLLOWING  A  MEAL 04/28/16  Yes Lyda Jester M, PA-C  Multiple Vitamin (MULTIVITAMIN WITH MINERALS) TABS tablet Take 1 tablet by mouth daily.   Yes [provider]  nitroGLYCERIN (NITROSTAT) 0.4 MG SL tablet Place 1 tablet (0.4 mg total) under the tongue every 5 (five) minutes as needed for chest pain.  10/14/15  Yes Simmons, Brittainy M, PA-C  pantoprazole (PROTONIX) 40 MG tablet Take 40 mg by mouth daily.    Yes [provider]  potassium chloride SA (K-DUR,KLOR-CON) 20 MEQ tablet Take 2 tablets (40 mEq total) by mouth daily. 10/14/15  Yes Simmons, Brittainy M, PA-C  RANEXA 500 MG 12 hr tablet TAKE ONE TABLET BY MOUTH TWICE DAILY 05/04/16  Yes Troy Sine, MD  rosuvastatin (CRESTOR) 40 MG tablet Take 1 tablet (40 mg total) by mouth at bedtime. 07/12/15  Yes Lily Kocher, MD  ticagrelor (BRILINTA) 90 MG TABS tablet Take 1 tablet (90 mg total) by mouth 2 (two) times daily. 10/14/15  Yes Rosita Fire, Brittainy M, PA-C  torsemide (DEMADEX) 20 MG tablet Take 60 mg by mouth 2 (two) times daily. Pt is able to take an additional tablet daily for weight gain greater than 3lbs.   Yes [provider]  trolamine salicylate (ASPERCREME) 10 % cream Apply 1 application topically as needed for muscle pain.   Yes [provider]    Physical Exam: Vitals:   11/10/16 2015 11/10/16 2200 11/10/16 2234 11/10/16 2309  BP:  100/69 93/64 (!) 110/58  Pulse:  97 (!) 101 70  Resp: 15  11 18   Temp:      TempSrc:      SpO2:  100% 100% 100%  Weight:      Height:        Constitutional: NAD, calm, comfortable Vitals:   11/10/16 2015 11/10/16 2200 11/10/16 2234 11/10/16 2309  BP:  100/69 93/64 (!) 110/58  Pulse:  97 (!) 101 70  Resp: 15  11 18   Temp:      TempSrc:      SpO2:  100% 100% 100%  Weight:      Height:       Eyes: PERRL, lids and conjunctivae normal ENMT: Mucous membranes are moist. Posterior pharynx clear of any exudate or lesions.Normal dentition.  Neck: normal, supple, no masses, no thyromegaly Respiratory: clear to auscultation bilaterally, no wheezing, no crackles. Normal respiratory effort. No accessory muscle use.  Cardiovascular: Regular rate and rhythm, no  murmurs / rubs / gallops. No extremity edema. 2+ pedal pulses. No carotid bruits.  Abdomen: no tenderness, no  masses palpated. No hepatosplenomegaly. Bowel sounds positive.  Musculoskeletal: no clubbing / cyanosis. No joint deformity upper and lower extremities. Good ROM, no contractures. Normal muscle tone.  Skin: no rashes, lesions, ulcers. No induration Neurologic: CN 2-12 grossly intact. Sensation intact, DTR normal. Strength 5/5 in all 4.  Psychiatric: Normal judgment and insight. Alert and oriented x 3. Normal mood.    Labs on Admission: I have personally reviewed following labs and imaging studies  CBC:  Recent Labs Lab 11/10/16 1902  WBC 7.1  HGB 8.8*  HCT 27.5*  MCV 98.2  PLT 676   Basic Metabolic Panel:  Recent Labs Lab 11/10/16 1902  NA 136  K 3.6  CL 95*  CO2 29  GLUCOSE 215*  BUN 65*  CREATININE 2.82*  CALCIUM 9.6   GFR: Estimated Creatinine Clearance: 21.6 mL/min (A) (by C-G formula based on SCr of 2.82 mg/dL (H)). CBG:  Recent Labs Lab 11/10/16 1846  GLUCAP 194*    Urine analysis:    Component Value Date/Time   COLORURINE YELLOW 11/10/2016 2055   APPEARANCEUR CLOUDY (A) 11/10/2016 2055   LABSPEC 1.012 11/10/2016 2055   PHURINE 5.0 11/10/2016 2055   GLUCOSEU NEGATIVE 11/10/2016 2055   HGBUR NEGATIVE 11/10/2016 2055   BILIRUBINUR NEGATIVE 11/10/2016 2055   [place] NEGATIVE 11/10/2016 2055   PROTEINUR 100 (A) 11/10/2016 2055   UROBILINOGEN 0.2 08/23/2007 2037   NITRITE NEGATIVE 11/10/2016 2055   LEUKOCYTESUR MODERATE (A) 11/10/2016 2055    Radiological Exams on Admission: Dg Chest Port 1 View  Result Date: 11/10/2016 CLINICAL DATA:  Shortness of breath tonight EXAM: PORTABLE CHEST 1 VIEW COMPARISON:  October 31, 2016 FINDINGS: The mediastinal contour is normal. The heart size is mildly enlarged. There is a small right pleural effusion with patchy consolidation of right lung base. The left lung is clear. No acute abnormalities identified in the visualized bones. IMPRESSION: Small right pleural effusion with patchy consolidation of right lung base,  underline pneumonia is not excluded. Electronically Signed   By: Abelardo Diesel M.D.   On: 11/10/2016 21:28    EKG: Independently reviewed. afib w rvr Old chart reviewed Case discussed with dr Eulis Foster   Assessment/Plan 71 yo female with new onset afib with rvr  Principal Problem:   New onset a-fib ([place])- already converted to nsr with 2 hours of dilt drip.  Stop drip.  Give extra dose of troprol 25mg  tonight, (on 50mg  daily at home).  Place on xaralto.  Pt had echo in 5/18 which did not show any valvular disease, EF 19%, diastolic dysfunction.  Will repeat to make sure no change but this was probably induced by anxiety/panic attack about issues with her husband as in HPI.  Mod /high risk for stroke.  Active Problems:   Hypoglycemia- cut long acting insulin in half.  Sensitive ssi.  Glucose over 200 now.   AKI (acute kidney injury) ([place])- mildly elevated to 2.8. Given 500 cc ivf bolus in ED.  Will not cont any further ivf.   Anxiety state- noted   CKD (chronic kidney disease), stage IV ([place])- baseline 2   DM type 2 causing vascular disease ([place])- as above    DVT prophylaxis:  Starting xaralto Code Status:  full Family Communication:  none Disposition Plan:  Per day team Consults called:  none Admission status:  observation   Selam Pietsch A MD [facility]  If  7PM-7AM, please contact night-coverage [email or web address] Password [email or web address]  11/11/2016, 12:01 AM

## 2016-11-12 DIAGNOSIS — I4891 Unspecified atrial fibrillation: Secondary | ICD-10-CM | POA: Diagnosis not present

## 2016-11-12 LAB — COMPREHENSIVE METABOLIC PANEL
ALK PHOS: 157 U/L — AB (ref 38–126)
ALT: 59 U/L — ABNORMAL HIGH (ref 14–54)
AST: 66 U/L — ABNORMAL HIGH (ref 15–41)
Albumin: 3.4 g/dL — ABNORMAL LOW (ref 3.5–5.0)
Anion gap: 13 (ref 5–15)
BILIRUBIN TOTAL: 0.7 mg/dL (ref 0.3–1.2)
BUN: 67 mg/dL — ABNORMAL HIGH (ref 6–20)
CHLORIDE: 96 mmol/L — AB (ref 101–111)
CO2: 24 mmol/L (ref 22–32)
Calcium: 9.1 mg/dL (ref 8.9–10.3)
Creatinine, Ser: 2.79 mg/dL — ABNORMAL HIGH (ref 0.44–1.00)
GFR calc Af Amer: 19 mL/min — ABNORMAL LOW (ref >60)
GFR calc non Af Amer: 16 mL/min — ABNORMAL LOW (ref >60)
Glucose, Bld: 173 mg/dL — ABNORMAL HIGH (ref 65–99)
Potassium: 4.1 mmol/L (ref 3.5–5.1)
SODIUM: 133 mmol/L — AB (ref 135–145)
Total Protein: 7.6 g/dL (ref 6.5–8.1)

## 2016-11-12 LAB — CBC
HEMATOCRIT: 27.8 % — AB (ref 36.0–46.0)
HEMOGLOBIN: 9 g/dL — AB (ref 12.0–15.0)
MCH: 31 pg (ref 26.0–34.0)
MCHC: 32.4 g/dL (ref 30.0–36.0)
MCV: 95.9 fL (ref 78.0–100.0)
PLATELETS: 352 10*3/uL (ref 150–400)
RBC: 2.9 MIL/uL — AB (ref 3.87–5.11)
RDW: 16.3 % — ABNORMAL HIGH (ref 11.5–15.5)
WBC: 7.6 10*3/uL (ref 4.0–10.5)

## 2016-11-12 LAB — GLUCOSE, CAPILLARY
GLUCOSE-CAPILLARY: 230 mg/dL — AB (ref 65–99)
GLUCOSE-CAPILLARY: 190 mg/dL — AB (ref 65–99)
GLUCOSE-CAPILLARY: 194 mg/dL — AB (ref 65–99)
Glucose-Capillary: 177 mg/dL — ABNORMAL HIGH (ref 65–99)

## 2016-11-12 MED ORDER — SENNOSIDES-DOCUSATE SODIUM 8.6-50 MG PO TABS
1.0000 | ORAL_TABLET | Freq: Two times a day (BID) | ORAL | Status: DC
  Administered 2016-11-12 – 2016-11-16 (×7): 1 via ORAL
  Filled 2016-11-12 (×9): qty 1

## 2016-11-12 MED ORDER — LORATADINE 10 MG PO TABS
10.0000 mg | ORAL_TABLET | Freq: Every day | ORAL | Status: DC | PRN
  Administered 2016-11-12: 10 mg via ORAL
  Filled 2016-11-12: qty 1

## 2016-11-12 MED ORDER — DOCUSATE SODIUM 100 MG PO CAPS: 100.0000 mg | ORAL_CAPSULE | Freq: Two times a day (BID) | ORAL | Status: DC

## 2016-11-12 MED ORDER — ACETAMINOPHEN 325 MG PO TABS
650.0000 mg | ORAL_TABLET | Freq: Four times a day (QID) | ORAL | Status: DC | PRN
  Administered 2016-11-13 – 2016-11-14 (×2): 650 mg via ORAL
  Filled 2016-11-12 (×3): qty 2

## 2016-11-12 MED ORDER — SODIUM CHLORIDE 0.9 % IV SOLN
INTRAVENOUS | Status: DC
  Administered 2016-11-12: 13:00:00 via INTRAVENOUS

## 2016-11-12 MED ORDER — DEXTROSE 5 % IV SOLN
1.0000 g | INTRAVENOUS | Status: DC
  Administered 2016-11-12: 1 g via INTRAVENOUS
  Filled 2016-11-12 (×2): qty 10

## 2016-11-12 MED ORDER — INSULIN GLARGINE 100 UNIT/ML ~~LOC~~ SOLN
28.0000 [IU] | Freq: Every day | SUBCUTANEOUS | Status: DC
  Administered 2016-11-12: 28 [IU] via SUBCUTANEOUS
  Filled 2016-11-12 (×2): qty 0.28

## 2016-11-12 NOTE — Progress Notes (Signed)
[place] TEAM 2  [name]  MVH:[id number] DOB: 06-12-1945 DOA: 11/10/2016 PCP: [name], MD    Brief Narrative:  70yoFwith a history of diastolic CHF, CKD (baseline cr around 2), DM, CAD, and anxiety, recently hospitalized for large right-sided pleural effusion c/w transudative physiology attributed it to her CHF who presented to the ED c/o anxiousness and weakness.  Patient was found to be in new onset atrial fibrillation with rapid ventricular response. While in the ED she converted back to normal sinus rhythm.  Subjective: The patient states she has lost her appetite.  She denies chest pain or current shortness of breath.  She reports poor intake over the last 24 hours.  She reports a constant low-grade nausea but no vomiting.  She admits that she has not had a bowel movement in many days.  She has not had a sense of palpitations or heart racing.  Assessment & Plan:  New onset atrial fibrillation with RVR Mg is normal - check TSH - has presently converted back to normal sinus rhythm - continue to follow on telemetry  Chronic combined systolic and diastolic CHF TTE this admit notes EF 30-35% w/ grade 2 DD - baseline wgt appears to be 89-91kg - clinically the patient appears to actually be slightly volume depleted at present - gently hydrate today and follow  Filed Weights   11/11/16 0000 11/11/16 0500 11/12/16 0500  Weight: 92.2 kg (203 lb 4.2 oz) 92.2 kg (203 lb 4.2 oz) 91.9 kg (202 lb 9 oz)   Acute kidney injury on chronic kidney disease stage II Baseline creatinine 2.0  Recent Labs Lab 11/10/16 1902 11/11/16 0530 11/12/16 0704  CREATININE 2.82* 2.62* 2.79*    Subclavian artery occlusion S/p thromboembolectomy - continue aspirin and Brillinta  Anemia of chronic disease No evidence of acute blood loss - likely due to chronic kidney disease - follow trend   Gram negative rod UTI Cont empiric abx and follow  DM 2 Poorly controlled - A1c 8.8 10/31/16 -  adjust tx and follow   Generalized anxiety disorder  DVT prophylaxis: xarelto  Code Status: FULL CODE Family Communication: no family present at time of exam  Disposition Plan: tele   Consultants:  none  Procedures: TTE  Antimicrobials:  [place] 9/8 >  Objective: Blood pressure (!) 152/67, pulse 81, temperature 98.8 F (37.1 C), resp. rate 18, height 5\' 8"  (1.727 m), weight 91.9 kg (202 lb 9 oz), SpO2 99 %.  Intake/Output Summary (Last 24 hours) at 11/12/16 1152 Last data filed at 11/12/16 0900  Gross per 24 hour  Intake           253.41 ml  Output                0 ml  Net           253.41 ml   Filed Weights   11/11/16 0000 11/11/16 0500 11/12/16 0500  Weight: 92.2 kg (203 lb 4.2 oz) 92.2 kg (203 lb 4.2 oz) 91.9 kg (202 lb 9 oz)    Examination: General: No acute respiratory distress Lungs: Clear to auscultation bilaterally without wheezes or crackles Cardiovascular: Regular rate and rhythm without murmur gallop or rub normal S1 and S2 Abdomen: Nontender, nondistended, soft, bowel sounds positive, no rebound, no ascites, no appreciable mass Extremities: No significant cyanosis, clubbing, or edema bilateral lower extremities  CBC:  Recent Labs Lab 11/10/16 1902 11/11/16 0530 11/12/16 0704  WBC 7.1 5.5 7.6  HGB 8.8* 8.4* 9.0*  HCT 27.5* 26.3* 27.8*  MCV 98.2 96.3 95.9  PLT 341 311 542   Basic Metabolic Panel:  Recent Labs Lab 11/10/16 1902 11/11/16 0530 11/12/16 0704  NA 136 136 133*  K 3.6 3.7 4.1  CL 95* 98* 96*  CO2 29 27 24   GLUCOSE 215* 172* 173*  BUN 65* 66* 67*  CREATININE 2.82* 2.62* 2.79*  CALCIUM 9.6 8.9 9.1  MG  --  2.2  --    GFR: Estimated Creatinine Clearance: 21.9 mL/min (A) (by C-G formula based on SCr of 2.79 mg/dL (H)).  Liver Function Tests:  Recent Labs Lab 11/12/16 0704  AST 66*  ALT 59*  ALKPHOS 157*  BILITOT 0.7  PROT 7.6  ALBUMIN 3.4*    HbA1C: Hemoglobin A1C  Date/Time Value Ref Range Status  09/28/2016  9.6  Final   Hgb A1c MFr Bld  Date/Time Value Ref Range Status  10/31/2016 05:30 PM 8.8 (H) 4.8 - 5.6 % Final    Comment:    (NOTE) Pre diabetes:          5.7%-6.4% Diabetes:              >6.4% Glycemic control for   <7.0% adults with diabetes   07/09/2016 02:40 PM 10.3 (H) 4.8 - 5.6 % Final    Comment:    (NOTE)         Pre-diabetes: 5.7 - 6.4         Diabetes: >6.4         Glycemic control for adults with diabetes: <7.0     CBG:  Recent Labs Lab 11/11/16 1143 11/11/16 1601 11/11/16 2018 11/12/16 0725 11/12/16 1141  GLUCAP 250* 375* 300* 177* 230*    Recent Results (from the past 240 hour(s))  Urine culture     Status: Abnormal (Preliminary result)   Collection Time: 11/10/16  8:55 PM  Result Value Ref Range Status   Specimen Description URINE, CLEAN CATCH  Final   Special Requests NONE  Final   Culture 50,000 COLONIES/mL GRAM NEGATIVE RODS (A)  Final   Report Status PENDING  Incomplete  MRSA PCR Screening     Status: None   Collection Time: 11/11/16  8:02 AM  Result Value Ref Range Status   MRSA by PCR NEGATIVE NEGATIVE Final    Comment:        The GeneXpert MRSA Assay (FDA approved for NASAL specimens only), is one component of a comprehensive MRSA colonization surveillance program. It is not intended to diagnose MRSA infection nor to guide or monitor treatment for MRSA infections.      Scheduled Meds: . aspirin EC  81 mg Oral Daily  . cyclobenzaprine  5 mg Oral QHS  . ferrous sulfate  325 mg Oral Q breakfast  . fluticasone  1 spray Each Nare Daily  . hydrALAZINE  50 mg Oral TID  . insulin aspart  0-5 Units Subcutaneous QHS  . insulin aspart  0-9 Units Subcutaneous TID WC  . insulin glargine  20 Units Subcutaneous QHS  . isosorbide mononitrate  60 mg Oral Daily  . latanoprost  1 drop Both Eyes QHS  . meclizine  12.5 mg Oral BID  . metoprolol succinate  50 mg Oral Daily  . multivitamin with minerals  1 tablet Oral Daily  . pantoprazole  40 mg  Oral Daily  . polyethylene glycol  17 g Oral BID  . potassium chloride SA  40 mEq Oral Daily  . ranolazine  500 mg Oral  BID  . rivaroxaban  15 mg Oral Q supper  . rosuvastatin  40 mg Oral QHS  . sodium chloride flush  3 mL Intravenous Q12H  . ticagrelor  90 mg Oral BID  . torsemide  20 mg Oral BID     LOS: 0 days   Cherene Altes, MD [facility] Office  [phone] Pager - Text Page per Shea Evans as per below:  On-Call/Text Page:      [email or web address]      password [email or web address]  If 7PM-7AM, please contact night-coverage [email or web address] Password [email or web address] 11/12/2016, 11:52 AM

## 2016-11-13 DIAGNOSIS — E876 Hypokalemia: Secondary | ICD-10-CM | POA: Diagnosis present

## 2016-11-13 DIAGNOSIS — R9439 Abnormal result of other cardiovascular function study: Secondary | ICD-10-CM | POA: Diagnosis not present

## 2016-11-13 DIAGNOSIS — N390 Urinary tract infection, site not specified: Secondary | ICD-10-CM | POA: Diagnosis present

## 2016-11-13 DIAGNOSIS — D631 Anemia in chronic kidney disease: Secondary | ICD-10-CM | POA: Diagnosis not present

## 2016-11-13 DIAGNOSIS — I482 Chronic atrial fibrillation: Secondary | ICD-10-CM | POA: Diagnosis present

## 2016-11-13 DIAGNOSIS — F410 Panic disorder [episodic paroxysmal anxiety] without agoraphobia: Secondary | ICD-10-CM | POA: Diagnosis present

## 2016-11-13 DIAGNOSIS — N184 Chronic kidney disease, stage 4 (severe): Secondary | ICD-10-CM | POA: Diagnosis not present

## 2016-11-13 DIAGNOSIS — I739 Peripheral vascular disease, unspecified: Secondary | ICD-10-CM | POA: Diagnosis not present

## 2016-11-13 DIAGNOSIS — I10 Essential (primary) hypertension: Secondary | ICD-10-CM | POA: Diagnosis not present

## 2016-11-13 DIAGNOSIS — I25118 Atherosclerotic heart disease of native coronary artery with other forms of angina pectoris: Secondary | ICD-10-CM | POA: Diagnosis not present

## 2016-11-13 DIAGNOSIS — N189 Chronic kidney disease, unspecified: Secondary | ICD-10-CM | POA: Diagnosis not present

## 2016-11-13 DIAGNOSIS — R011 Cardiac murmur, unspecified: Secondary | ICD-10-CM | POA: Diagnosis present

## 2016-11-13 DIAGNOSIS — F411 Generalized anxiety disorder: Secondary | ICD-10-CM | POA: Diagnosis not present

## 2016-11-13 DIAGNOSIS — I4891 Unspecified atrial fibrillation: Secondary | ICD-10-CM | POA: Diagnosis not present

## 2016-11-13 DIAGNOSIS — J189 Pneumonia, unspecified organism: Secondary | ICD-10-CM | POA: Diagnosis present

## 2016-11-13 DIAGNOSIS — E11649 Type 2 diabetes mellitus with hypoglycemia without coma: Secondary | ICD-10-CM | POA: Diagnosis present

## 2016-11-13 DIAGNOSIS — N3281 Overactive bladder: Secondary | ICD-10-CM | POA: Diagnosis present

## 2016-11-13 DIAGNOSIS — I480 Paroxysmal atrial fibrillation: Secondary | ICD-10-CM | POA: Diagnosis present

## 2016-11-13 DIAGNOSIS — I429 Cardiomyopathy, unspecified: Secondary | ICD-10-CM | POA: Diagnosis not present

## 2016-11-13 DIAGNOSIS — K219 Gastro-esophageal reflux disease without esophagitis: Secondary | ICD-10-CM | POA: Diagnosis present

## 2016-11-13 DIAGNOSIS — E1159 Type 2 diabetes mellitus with other circulatory complications: Secondary | ICD-10-CM | POA: Diagnosis not present

## 2016-11-13 DIAGNOSIS — E1122 Type 2 diabetes mellitus with diabetic chronic kidney disease: Secondary | ICD-10-CM | POA: Diagnosis present

## 2016-11-13 DIAGNOSIS — B9620 Unspecified Escherichia coli [E. coli] as the cause of diseases classified elsewhere: Secondary | ICD-10-CM | POA: Diagnosis present

## 2016-11-13 DIAGNOSIS — I471 Supraventricular tachycardia: Secondary | ICD-10-CM | POA: Diagnosis present

## 2016-11-13 DIAGNOSIS — I5043 Acute on chronic combined systolic (congestive) and diastolic (congestive) heart failure: Secondary | ICD-10-CM | POA: Diagnosis not present

## 2016-11-13 DIAGNOSIS — K5909 Other constipation: Secondary | ICD-10-CM | POA: Diagnosis present

## 2016-11-13 DIAGNOSIS — E785 Hyperlipidemia, unspecified: Secondary | ICD-10-CM | POA: Diagnosis present

## 2016-11-13 DIAGNOSIS — N179 Acute kidney failure, unspecified: Secondary | ICD-10-CM | POA: Diagnosis not present

## 2016-11-13 DIAGNOSIS — I130 Hypertensive heart and chronic kidney disease with heart failure and stage 1 through stage 4 chronic kidney disease, or unspecified chronic kidney disease: Secondary | ICD-10-CM | POA: Diagnosis present

## 2016-11-13 DIAGNOSIS — E869 Volume depletion, unspecified: Secondary | ICD-10-CM | POA: Diagnosis present

## 2016-11-13 DIAGNOSIS — I519 Heart disease, unspecified: Secondary | ICD-10-CM | POA: Diagnosis not present

## 2016-11-13 DIAGNOSIS — I459 Conduction disorder, unspecified: Secondary | ICD-10-CM | POA: Diagnosis present

## 2016-11-13 LAB — GLUCOSE, CAPILLARY
Glucose-Capillary: 116 mg/dL — ABNORMAL HIGH (ref 65–99)
Glucose-Capillary: 208 mg/dL — ABNORMAL HIGH (ref 65–99)
Glucose-Capillary: 207 mg/dL — ABNORMAL HIGH (ref 65–99)
Glucose-Capillary: 250 mg/dL — ABNORMAL HIGH (ref 65–99)

## 2016-11-13 LAB — COMPREHENSIVE METABOLIC PANEL
ALT: 61 U/L — ABNORMAL HIGH (ref 14–54)
ANION GAP: 14 (ref 5–15)
AST: 64 U/L — AB (ref 15–41)
Albumin: 3.4 g/dL — ABNORMAL LOW (ref 3.5–5.0)
Alkaline Phosphatase: 179 U/L — ABNORMAL HIGH (ref 38–126)
BILIRUBIN TOTAL: 0.6 mg/dL (ref 0.3–1.2)
BUN: 71 mg/dL — AB (ref 6–20)
CO2: 25 mmol/L (ref 22–32)
Calcium: 9.6 mg/dL (ref 8.9–10.3)
Chloride: 98 mmol/L — ABNORMAL LOW (ref 101–111)
Creatinine, Ser: 2.9 mg/dL — ABNORMAL HIGH (ref 0.44–1.00)
GFR, EST AFRICAN AMERICAN: 18 mL/min — AB (ref >60)
GFR, EST NON AFRICAN AMERICAN: 15 mL/min — AB (ref >60)
Glucose, Bld: 118 mg/dL — ABNORMAL HIGH (ref 65–99)
Potassium: 4.4 mmol/L (ref 3.5–5.1)
Sodium: 137 mmol/L (ref 135–145)
TOTAL PROTEIN: 7.7 g/dL (ref 6.5–8.1)

## 2016-11-13 LAB — CBC
HCT: 27 % — ABNORMAL LOW (ref 36.0–46.0)
Hemoglobin: 8.9 g/dL — ABNORMAL LOW (ref 12.0–15.0)
MCH: 31.8 pg (ref 26.0–34.0)
MCHC: 33 g/dL (ref 30.0–36.0)
MCV: 96.4 fL (ref 78.0–100.0)
PLATELETS: 360 10*3/uL (ref 150–400)
RBC: 2.8 MIL/uL — AB (ref 3.87–5.11)
RDW: 16.4 % — ABNORMAL HIGH (ref 11.5–15.5)
WBC: 8 10*3/uL (ref 4.0–10.5)

## 2016-11-13 LAB — URINE CULTURE: Culture: 50000 — AB

## 2016-11-13 LAB — PHOSPHORUS: Phosphorus: 3.6 mg/dL (ref 2.5–4.6)

## 2016-11-13 LAB — MAGNESIUM: Magnesium: 2.3 mg/dL (ref 1.7–2.4)

## 2016-11-13 LAB — TSH: TSH: 1.199 u[IU]/mL (ref 0.350–4.500)

## 2016-11-13 MED ORDER — INSULIN GLARGINE 100 UNIT/ML ~~LOC~~ SOLN
34.0000 [IU] | Freq: Every day | SUBCUTANEOUS | Status: DC
  Administered 2016-11-13 – 2016-11-14 (×2): 34 [IU] via SUBCUTANEOUS
  Filled 2016-11-13 (×2): qty 0.34

## 2016-11-13 MED ORDER — FUROSEMIDE 10 MG/ML IJ SOLN
80.0000 mg | Freq: Three times a day (TID) | INTRAMUSCULAR | Status: DC
  Administered 2016-11-13 – 2016-11-16 (×10): 80 mg via INTRAVENOUS
  Filled 2016-11-13 (×10): qty 8

## 2016-11-13 MED ORDER — MENTHOL 3 MG MT LOZG
1.0000 | LOZENGE | OROMUCOSAL | Status: DC | PRN
Start: 2016-11-13 — End: 2016-11-16
  Administered 2016-11-13: 3 mg via ORAL
  Filled 2016-11-13: qty 9

## 2016-11-13 MED ORDER — CEFUROXIME AXETIL 250 MG PO TABS
500.0000 mg | ORAL_TABLET | Freq: Two times a day (BID) | ORAL | Status: DC
  Administered 2016-11-13 – 2016-11-14 (×2): 500 mg via ORAL
  Filled 2016-11-13 (×2): qty 2

## 2016-11-13 NOTE — Progress Notes (Addendum)
[place] TEAM 2  [name]  ZOX:[id number] DOB: 1945/08/19 DOA: 11/10/2016 PCP: [name], MD    Brief Narrative:  70yoFwith a history of diastolic CHF, CKD (baseline cr around 2), DM, CAD, and anxiety, recently hospitalized for large right-sided pleural effusion c/w transudative physiology attributed it to her CHF who presented to the ED c/o anxiousness and weakness.  Patient was found to be in new onset atrial fibrillation with rapid ventricular response. While in the ED she converted back to normal sinus rhythm.  Subjective: The patient complains of some pain in the left side of her neck consistent with "a crick."  She states that overall she feels better.  She denies shortness of breath nausea vomiting or abdominal pain the present time.  She denies any recent chest pain or substernal chest pressure.  Assessment & Plan:  New onset atrial fibrillation with RVR Mg is normal - TSH is normal - remains in normal sinus rhythm - follow on telemetry - CHA2DS2 VASc score is 5 - pt now on anticoag - will ask Cards to see in AM (pt is followed by Dr. Bronson Ing) and to advise if pt needs xarelto + asa + brilinta  Chronic combined systolic and diastolic CHF - acute worsening  TTE this admit notes EF 30-35% w/ grade 2 DD - last TTE in May 2018 noted EF 45-50% grade 2 DD - baseline wgt appears to be 89-91kg - diuresis was held yesterday, and pt was hydrated gently - her renal fxn has not improved with this - stop IVF today and follow w/ resumption of diuretic - will ask her Cardiologist to see her in the AM to determine if further w/u is presently indicated   Filed Weights   11/11/16 0500 11/12/16 0500 11/13/16 0441  Weight: 92.2 kg (203 lb 4.2 oz) 91.9 kg (202 lb 9 oz) 94.1 kg (207 lb 6.4 oz)   Acute kidney injury on chronic kidney disease stage II Baseline creatinine 2.0 - did not improve w/gentle volume expansion yesterday - perhaps she suffered a period of poor perfusion in setting of  Afib w/ RVR - resume diuretic today and follow in serial fashion   Recent Labs Lab 11/10/16 1902 11/11/16 0530 11/12/16 0704 11/13/16 0736  CREATININE 2.82* 2.62* 2.79* 2.90*    CAD w/ stents in mid RCA and distal RCA/PDA Last cardiac cath Sept 2017 - denies SSCP but EF has worsened - Ranexa stopped in setting of worsening renal fxn   Subclavian artery occlusion S/p thromboembolectomy - continue aspirin and Brillinta  Anemia of chronic disease No evidence of acute blood loss - likely due to chronic kidney disease - follow trend   E coli UTI Transition to oral abx to complete a 7 day tx course   DM 2 Poorly controlled - A1c 8.8 10/31/16 - adjust tx again today   Generalized anxiety disorder Appears well compensated at present   DVT prophylaxis: xarelto  Code Status: FULL CODE Family Communication: spoke w/ daughters at bedside   Disposition Plan: tele   Consultants:  None - plan to consult Cards 9/10  Procedures: TTE  Antimicrobials:  [place] 9/8 >  Objective: Blood pressure (!) 142/73, pulse 78, temperature 99 F (37.2 C), temperature source Oral, resp. rate 20, height 5\' 8"  (1.727 m), weight 94.1 kg (207 lb 6.4 oz), SpO2 100 %.  Intake/Output Summary (Last 24 hours) at 11/13/16 1138 Last data filed at 11/12/16 1700  Gross per 24 hour  Intake  660 ml  Output                0 ml  Net              660 ml   Filed Weights   11/11/16 0500 11/12/16 0500 11/13/16 0441  Weight: 92.2 kg (203 lb 4.2 oz) 91.9 kg (202 lb 9 oz) 94.1 kg (207 lb 6.4 oz)    Examination: General: No acute respiratory distress - alert and oriented  Lungs: CTA B w/o wheezing or focal crackles  Cardiovascular: Regular rate and rhythm w/o M or rub  Abdomen: Nontender, nondistended, soft, bowel sounds positive, no rebound Extremities: 1+ B LE edema w/o cyanosis   CBC:  Recent Labs Lab 11/10/16 1902 11/11/16 0530 11/12/16 0704 11/13/16 0736  WBC 7.1 5.5 7.6 8.0  HGB  8.8* 8.4* 9.0* 8.9*  HCT 27.5* 26.3* 27.8* 27.0*  MCV 98.2 96.3 95.9 96.4  PLT 341 311 352 951   Basic Metabolic Panel:  Recent Labs Lab 11/10/16 1902 11/11/16 0530 11/12/16 0704 11/13/16 0736  NA 136 136 133* 137  K 3.6 3.7 4.1 4.4  CL 95* 98* 96* 98*  CO2 29 27 24 25   GLUCOSE 215* 172* 173* 118*  BUN 65* 66* 67* 71*  CREATININE 2.82* 2.62* 2.79* 2.90*  CALCIUM 9.6 8.9 9.1 9.6  MG  --  2.2  --  2.3  PHOS  --   --   --  3.6   GFR: Estimated Creatinine Clearance: 21.3 mL/min (A) (by C-G formula based on SCr of 2.9 mg/dL (H)).  Liver Function Tests:  Recent Labs Lab 11/12/16 0704 11/13/16 0736  AST 66* 64*  ALT 59* 61*  ALKPHOS 157* 179*  BILITOT 0.7 0.6  PROT 7.6 7.7  ALBUMIN 3.4* 3.4*    HbA1C: Hemoglobin A1C  Date/Time Value Ref Range Status  09/28/2016 9.6  Final   Hgb A1c MFr Bld  Date/Time Value Ref Range Status  10/31/2016 05:30 PM 8.8 (H) 4.8 - 5.6 % Final    Comment:    (NOTE) Pre diabetes:          5.7%-6.4% Diabetes:              >6.4% Glycemic control for   <7.0% adults with diabetes   07/09/2016 02:40 PM 10.3 (H) 4.8 - 5.6 % Final    Comment:    (NOTE)         Pre-diabetes: 5.7 - 6.4         Diabetes: >6.4         Glycemic control for adults with diabetes: <7.0     CBG:  Recent Labs Lab 11/12/16 1141 11/12/16 1645 11/12/16 2043 11/13/16 0819 11/13/16 1135  GLUCAP 230* 190* 194* 116* 208*    Recent Results (from the past 240 hour(s))  Urine culture     Status: Abnormal   Collection Time: 11/10/16  8:55 PM  Result Value Ref Range Status   Specimen Description URINE, CLEAN CATCH  Final   Special Requests NONE  Final   Culture 50,000 COLONIES/mL ESCHERICHIA COLI (A)  Final   Report Status 11/13/2016 FINAL  Final   Organism ID, Bacteria ESCHERICHIA COLI (A)  Final      Susceptibility   Escherichia coli - MIC*    AMPICILLIN >=32 RESISTANT Resistant     CEFAZOLIN <=4 SENSITIVE Sensitive     CEFTRIAXONE <=1 SENSITIVE  Sensitive     CIPROFLOXACIN >=4 RESISTANT Resistant     GENTAMICIN <=  1 SENSITIVE Sensitive     IMIPENEM <=0.25 SENSITIVE Sensitive     NITROFURANTOIN <=16 SENSITIVE Sensitive     TRIMETH/SULFA >=320 RESISTANT Resistant     AMPICILLIN/SULBACTAM >=32 RESISTANT Resistant     PIP/TAZO 64 INTERMEDIATE Intermediate     Extended ESBL NEGATIVE Sensitive     * 50,000 COLONIES/mL ESCHERICHIA COLI  MRSA PCR Screening     Status: None   Collection Time: 11/11/16  8:02 AM  Result Value Ref Range Status   MRSA by PCR NEGATIVE NEGATIVE Final    Comment:        The GeneXpert MRSA Assay (FDA approved for NASAL specimens only), is one component of a comprehensive MRSA colonization surveillance program. It is not intended to diagnose MRSA infection nor to guide or monitor treatment for MRSA infections.      Scheduled Meds: . aspirin EC  81 mg Oral Daily  . cyclobenzaprine  5 mg Oral QHS  . ferrous sulfate  325 mg Oral Q breakfast  . fluticasone  1 spray Each Nare Daily  . hydrALAZINE  50 mg Oral TID  . insulin aspart  0-5 Units Subcutaneous QHS  . insulin aspart  0-9 Units Subcutaneous TID WC  . insulin glargine  28 Units Subcutaneous QHS  . isosorbide mononitrate  60 mg Oral Daily  . latanoprost  1 drop Both Eyes QHS  . meclizine  12.5 mg Oral BID  . metoprolol succinate  50 mg Oral Daily  . multivitamin with minerals  1 tablet Oral Daily  . pantoprazole  40 mg Oral Daily  . polyethylene glycol  17 g Oral BID  . rivaroxaban  15 mg Oral Q supper  . rosuvastatin  40 mg Oral QHS  . senna-docusate  1 tablet Oral BID  . ticagrelor  90 mg Oral BID     LOS: 0 days   Cherene Altes, MD [facility] Office  [phone] Pager - Text Page per Amion as per below:  On-Call/Text Page:      [email or web address]      password [email or web address]  If 7PM-7AM, please contact night-coverage [email or web address] Password [email or web address] 11/13/2016, 11:38 AM

## 2016-11-14 DIAGNOSIS — N179 Acute kidney failure, unspecified: Secondary | ICD-10-CM

## 2016-11-14 DIAGNOSIS — I25118 Atherosclerotic heart disease of native coronary artery with other forms of angina pectoris: Secondary | ICD-10-CM

## 2016-11-14 DIAGNOSIS — I5043 Acute on chronic combined systolic (congestive) and diastolic (congestive) heart failure: Secondary | ICD-10-CM

## 2016-11-14 DIAGNOSIS — I739 Peripheral vascular disease, unspecified: Secondary | ICD-10-CM

## 2016-11-14 DIAGNOSIS — N189 Chronic kidney disease, unspecified: Secondary | ICD-10-CM

## 2016-11-14 DIAGNOSIS — D631 Anemia in chronic kidney disease: Secondary | ICD-10-CM

## 2016-11-14 DIAGNOSIS — I429 Cardiomyopathy, unspecified: Secondary | ICD-10-CM

## 2016-11-14 DIAGNOSIS — E1159 Type 2 diabetes mellitus with other circulatory complications: Secondary | ICD-10-CM

## 2016-11-14 LAB — BASIC METABOLIC PANEL
Anion gap: 12 (ref 5–15)
BUN: 71 mg/dL — AB (ref 6–20)
CALCIUM: 9.3 mg/dL (ref 8.9–10.3)
CO2: 25 mmol/L (ref 22–32)
CREATININE: 2.95 mg/dL — AB (ref 0.44–1.00)
Chloride: 97 mmol/L — ABNORMAL LOW (ref 101–111)
GFR calc Af Amer: 17 mL/min — ABNORMAL LOW (ref >60)
GFR, EST NON AFRICAN AMERICAN: 15 mL/min — AB (ref >60)
GLUCOSE: 176 mg/dL — AB (ref 65–99)
Potassium: 4.2 mmol/L (ref 3.5–5.1)
SODIUM: 134 mmol/L — AB (ref 135–145)

## 2016-11-14 LAB — GLUCOSE, CAPILLARY
GLUCOSE-CAPILLARY: 162 mg/dL — AB (ref 65–99)
Glucose-Capillary: 284 mg/dL — ABNORMAL HIGH (ref 65–99)
Glucose-Capillary: 247 mg/dL — ABNORMAL HIGH (ref 65–99)
Glucose-Capillary: 312 mg/dL — ABNORMAL HIGH (ref 65–99)

## 2016-11-14 LAB — CBC
HCT: 26.5 % — ABNORMAL LOW (ref 36.0–46.0)
Hemoglobin: 8.9 g/dL — ABNORMAL LOW (ref 12.0–15.0)
MCH: 32.4 pg (ref 26.0–34.0)
MCHC: 33.6 g/dL (ref 30.0–36.0)
MCV: 96.4 fL (ref 78.0–100.0)
PLATELETS: 344 10*3/uL (ref 150–400)
RBC: 2.75 MIL/uL — AB (ref 3.87–5.11)
RDW: 16.6 % — AB (ref 11.5–15.5)
WBC: 6.5 10*3/uL (ref 4.0–10.5)

## 2016-11-14 MED ORDER — TICAGRELOR 60 MG PO TABS
60.0000 mg | ORAL_TABLET | Freq: Two times a day (BID) | ORAL | Status: DC
  Administered 2016-11-14 – 2016-11-16 (×4): 60 mg via ORAL
  Filled 2016-11-14 (×8): qty 1

## 2016-11-14 MED ORDER — WARFARIN VIDEO: Freq: Once | Status: DC

## 2016-11-14 MED ORDER — BISACODYL 10 MG RE SUPP: 10.0000 mg | Freq: Every day | RECTAL | Status: DC | PRN

## 2016-11-14 MED ORDER — HYDRALAZINE HCL 25 MG PO TABS
100.0000 mg | ORAL_TABLET | Freq: Three times a day (TID) | ORAL | Status: DC
  Administered 2016-11-14 – 2016-11-16 (×6): 100 mg via ORAL
  Filled 2016-11-14 (×8): qty 4

## 2016-11-14 MED ORDER — WARFARIN SODIUM 5 MG PO TABS
5.0000 mg | ORAL_TABLET | Freq: Once | ORAL | Status: AC
  Administered 2016-11-14: 5 mg via ORAL
  Filled 2016-11-14: qty 1

## 2016-11-14 MED ORDER — METOPROLOL SUCCINATE ER 50 MG PO TB24
75.0000 mg | ORAL_TABLET | Freq: Every day | ORAL | Status: DC
  Administered 2016-11-15 – 2016-11-16 (×2): 75 mg via ORAL
  Filled 2016-11-14 (×2): qty 1

## 2016-11-14 MED ORDER — COUMADIN BOOK
Freq: Once | Status: AC
  Administered 2016-11-14: 1
  Filled 2016-11-14: qty 1

## 2016-11-14 MED ORDER — CEFUROXIME AXETIL 250 MG PO TABS
500.0000 mg | ORAL_TABLET | Freq: Every day | ORAL | Status: DC
  Administered 2016-11-15 – 2016-11-16 (×2): 500 mg via ORAL
  Filled 2016-11-14 (×2): qty 2

## 2016-11-14 MED ORDER — WARFARIN - PHARMACIST DOSING INPATIENT
Status: DC
  Administered 2016-11-15: 16:00:00

## 2016-11-14 NOTE — Consult Note (Addendum)
Cardiology Consultation:   Patient ID: HEAVYN YEARSLEY; [id number]; Nov 07, 1945   Admit date: 11/10/2016 Date of Consult: 11/14/2016  Primary Care Provider: [name], MD Primary Cardiologist: Bronson Ing     Patient Profile:   [name] is a 71 y.o. 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 Thereasa Solo   History of Present Illness:   Ms. Brier he presented to the emergency room, with new onset atrial fibrillation with RVR heart rate in the 120s. Apparently the patient's husband has recently been seen at [facility] in the setting of an MI, was given a phone call to let her know that he was able to come home, apparently he was being discharged with IV access and a Foley catheterization, and she became upset and started to panic about this as she was unable to care for him, and was unprepared for him to return home that day. In her anxiety she began to have dizziness and chest pressure. Daughter who was with her, called EMS  On arrival to the emergency room heart rate was 106, with a blood pressure 107/59, O2 sat 99%, she was afebrile. EKG revealed atrial fibrillation, T wave inversion noted laterally, with early repolarization, heart rate 119 bpm. She apparently had been started on a diltiazem drip, and converted to normal sinus rhythm on dilitazem.  Subsequent EKG revealed normal sinus rhythm with intraventricular conduction delay and T-wave inversion  in time laterally.Marland Kitchen She was also found to have a UTI with Escherichia coli. Pertinent labs revealed Troponin 0.03, potassium of 3.6, glucose 215, creatinine 2.82. Hemoglobin was 8.8, hematocrit 27.5, there was no  leukocytosis, no thrombocytopenia. CXR revealed small right pleural effusion, pneumonia could not be excluded.    Past Medical History:  Diagnosis Date  . Acute renal failure superimposed on stage 4 chronic kidney disease ([place]) 07/04/2015  . Allergic rhinitis   . Anemia   . Anxiety   . CAD in native artery    NSTEMI 07/2015 s/p DES to RCA and posterior PDA, PCI 10/2015 with scoring balloon to 85% ISR of distal RCA), known LAD/Cx disease treated medically  . Carotid artery disease ([place])    Mild bilateral carotid disease (1-39% 07/2015)  . Chronic combined systolic and diastolic CHF (congestive heart failure) ([place])   . CKD (chronic kidney disease), stage IV ([place])   . Constipation   . Diabetes mellitus   . GERD (gastroesophageal reflux disease)   . Heart murmur, systolic   . History of hysterectomy   . Hyperlipidemia   . Hypertension   . Low back pain   . Obesity   . 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 Osteoarthritis   . Overactive bladder   . PVC's (premature ventricular contractions)     Past Surgical History:  Procedure Laterality Date  . ABDOMINAL HYSTERECTOMY    . BACK SURGERY     multiple  . CARDIAC CATHETERIZATION  04/04/2006   Est EF of 60%  . CARDIAC CATHETERIZATION N/A 07/04/2015   Procedure: Left Heart Cath and Coronary Angiography;  Surgeon: Troy Sine, MD;  Location: [facility];  Service: Cardiovascular;  Laterality: N/A;  . CARDIAC CATHETERIZATION N/A 07/04/2015   Procedure: Coronary Stent Intervention;  Surgeon: Troy Sine, MD;  Location: [place] [facility];  Service: Cardiovascular;  Laterality: N/A;  . CARDIAC CATHETERIZATION N/A 10/13/2015   Procedure: Left Heart Cath and Coronary Angiography;  Surgeon: Troy Sine, MD;  Location: [place] [facility];  Service: Cardiovascular;  Laterality: N/A;  . CARDIAC CATHETERIZATION N/A 12/02/2015   Procedure: Left Heart  Cath and Coronary Angiography;  Surgeon: Peter M Martinique, MD;  Location: [place] [facility];  Service: Cardiovascular;  Laterality: N/A;  . CARPAL TUNNEL RELEASE    . CERVICAL BIOPSY     cervical lymph node biopsies  . COLONOSCOPY  May 2002   Dr. Irving Shows :Followup in 5 years, normal exam  . COLONOSCOPY  2008   Dr. Laural Golden: Very redundant colon with mild melanosis coli, splenic flexure polyp biopsy with acute complaint of benign colon polyp. Recommended ten-year followup  . CORONARY STENT PLACEMENT  04/11/2006   2 -- Taxus stents to the circumflex   . PERIPHERAL VASCULAR CATHETERIZATION Right 07/09/2015   Procedure: Upper Extremity Angiography;  Surgeon: Conrad Wagner, MD;  Location: [place] [facility];  Service: Cardiovascular;  Laterality: Right;  . PERIPHERAL VASCULAR CATHETERIZATION Right 07/10/2015   Procedure: RIGHT SUBCLAVIAN ARTERY THROMBECTOMY;  Surgeon: Serafina Mitchell, MD;  Location: [facility];  Service: Vascular;  Laterality: Right;  . tendonitis     bilateral elbow  . TRIGGER FINGER RELEASE       Home Medications:  Prior to Admission medications   Medication Sig Start Date End Date Taking? Authorizing Provider  acetaminophen (TYLENOL) 500 MG tablet Take 2 tablets (1,000 mg total) by mouth every 6 (six) hours as needed (pain). 07/12/15  Yes Lily Kocher, MD  albuterol (PROVENTIL HFA;VENTOLIN HFA) 108 (90 Base) MCG/ACT inhaler Inhale 1-2 puffs into the lungs every 6 (six) hours as needed for wheezing or shortness of breath. 04/01/16  Yes Nanavati, Ankit, MD  albuterol (PROVENTIL) (2.5 MG/3ML) 0.083% nebulizer solution Take 3 mLs (2.5 mg total) by nebulization every 6 (six) hours as needed for wheezing or shortness of breath. 07/16/16  Yes Elgergawy, Silver Huguenin, MD  aspirin EC 81 MG tablet Take 81 mg by mouth daily.   Yes [provider]  Continuous Blood Gluc Sensor ([place]) MISC Use one sensor every 10 days for continuous glucose monitoring. 10/05/16  Yes Nida,  Marella Chimes, MD  cyclobenzaprine (FLEXERIL) 5 MG tablet Take 5 mg by mouth at bedtime. 10/03/16  Yes [provider]  diazepam (VALIUM) 5 MG tablet Take 1 tablet (5 mg total) by mouth every 12 (twelve) hours as needed for muscle spasms. 10/29/16  Yes Julianne Rice, MD  ferrous sulfate 325 (65 FE) MG tablet Take 325 mg by mouth daily with breakfast.    Yes [provider]  fluticasone (FLONASE) 50 MCG/ACT nasal spray Place 1 spray into both nostrils daily.   Yes [provider]  hydrALAZINE (APRESOLINE) 25 MG tablet Take 2 tablets (50 mg total) by mouth 3 (three) times daily. 01/21/16  Yes Herminio Commons, MD  HYDROcodone-homatropine (HYCODAN) 5-1.5 MG/5ML syrup Take 5 mLs by mouth every 6 (six) hours as needed for cough.  08/30/16  Yes [provider]  insulin degludec (TRESIBA) 100 UNIT/ML SOPN FlexTouch Pen Inject 40 Units into the skin at bedtime.    Yes [provider]  insulin lispro (HUMALOG KWIKPEN) 100  UNIT/ML KiwkPen INJECT 18 TO 24 UNITS TOTAL INTO THE SKIN THREE TIMES DAILY 10/05/16  Yes Nida, Marella Chimes, MD  isosorbide mononitrate (IMDUR) 60 MG 24 hr tablet Take 1 tablet (60 mg total) by mouth daily. 07/12/15  Yes Lily Kocher, MD  latanoprost (XALATAN) 0.005 % ophthalmic solution Place 1 drop into both eyes at bedtime.    Yes [provider]  meclizine (ANTIVERT) 25 MG tablet Take 12.5 mg by mouth 2 (two) times daily.    Yes [provider]  metoprolol succinate (TOPROL-XL) 50 MG 24 hr tablet TAKE ONE TABLET BY MOUTH TWICE DAILY WITH  OR  IMMEDIATELY  FOLLOWING  A  MEAL 04/28/16  Yes Lyda Jester M, PA-C  Multiple Vitamin (MULTIVITAMIN WITH MINERALS) TABS tablet Take 1 tablet by mouth daily.   Yes [provider]  nitroGLYCERIN (NITROSTAT) 0.4 MG SL tablet Place 1 tablet (0.4 mg total) under the tongue every 5 (five) minutes as needed for chest pain. 10/14/15  Yes Simmons, Brittainy M, PA-C  pantoprazole  (PROTONIX) 40 MG tablet Take 40 mg by mouth daily.    Yes [provider]  potassium chloride SA (K-DUR,KLOR-CON) 20 MEQ tablet Take 2 tablets (40 mEq total) by mouth daily. 10/14/15  Yes Simmons, Brittainy M, PA-C  RANEXA 500 MG 12 hr tablet TAKE ONE TABLET BY MOUTH TWICE DAILY 05/04/16  Yes Troy Sine, MD  rosuvastatin (CRESTOR) 40 MG tablet Take 1 tablet (40 mg total) by mouth at bedtime. 07/12/15  Yes Lily Kocher, MD  ticagrelor (BRILINTA) 90 MG TABS tablet Take 1 tablet (90 mg total) by mouth 2 (two) times daily. 10/14/15  Yes Rosita Fire, Brittainy M, PA-C  torsemide (DEMADEX) 20 MG tablet Take 60 mg by mouth 2 (two) times daily. Pt is able to take an additional tablet daily for weight gain greater than 3lbs.   Yes [provider]  trolamine salicylate (ASPERCREME) 10 % cream Apply 1 application topically as needed for muscle pain.   Yes [provider]    Inpatient Medications: Scheduled Meds: . aspirin EC  81 mg Oral Daily  . cefUROXime  500 mg Oral BID WC  . ferrous sulfate  325 mg Oral Q breakfast  . fluticasone  1 spray Each Nare Daily  . furosemide  80 mg Intravenous Q8H  . hydrALAZINE  50 mg Oral TID  . insulin aspart  0-5 Units Subcutaneous QHS  . insulin aspart  0-9 Units Subcutaneous TID WC  . insulin glargine  34 Units Subcutaneous QHS  . isosorbide mononitrate  60 mg Oral Daily  . latanoprost  1 drop Both Eyes QHS  . meclizine  12.5 mg Oral BID  . metoprolol succinate  50 mg Oral Daily  . multivitamin with minerals  1 tablet Oral Daily  . pantoprazole  40 mg Oral Daily  . polyethylene glycol  17 g Oral BID  . rivaroxaban  15 mg Oral Q supper  . rosuvastatin  40 mg Oral QHS  . senna-docusate  1 tablet Oral BID  . ticagrelor  90 mg Oral BID   Continuous Infusions:  PRN Meds: acetaminophen, bisacodyl, diazepam, HYDROcodone-homatropine, loratadine, menthol-cetylpyridinium, nitroGLYCERIN  Allergies:    Allergies  Allergen Reactions  . Ace  Inhibitors Cough  . Amlodipine     Edema   . Codeine Rash    Social History:   Social History   Social History  . Marital status: Married    Spouse name: N/A  . Number of children: 3  .  Years of education: N/A   Occupational History  . Not on file.   Social History Main Topics  . Smoking status: Never Smoker  . Smokeless tobacco: Never Used  . Alcohol use No  . Drug use: No  . Sexual activity: Not on file   Other Topics Concern  . Not on file   Social History Narrative  . No narrative on file    Family History:    Family History  Problem Relation Age of Onset  . Diabetes Father   . Hypertension Father   . Stroke Father   . Hypertension Brother   . Aneurysm Brother   . Diabetes Brother   . Colon cancer Neg Hx      ROS:  Please see the history of present illness.  ROS  All other ROS reviewed and negative.     Physical Exam/Data:   Vitals:   11/13/16 1449 11/13/16 2059 11/14/16 0500 11/14/16 0549  BP: (!) 146/68 (!) 167/91  (!) 160/73  Pulse: 77 82  84  Resp: 20 20  20   Temp: 99.1 F (37.3 C) 99 F (37.2 C)  98.4 F (36.9 C)  TempSrc: Oral Oral  Oral  SpO2: 100% 100%  100%  Weight:   207 lb 1.6 oz (93.9 kg)   Height:       No intake or output data in the 24 hours ending 11/14/16 0935 Filed Weights   11/12/16 0500 11/13/16 0441 11/14/16 0500  Weight: 202 lb 9 oz (91.9 kg) 207 lb 6.4 oz (94.1 kg) 207 lb 1.6 oz (93.9 kg)   Body mass index is 31.49 kg/m.   General:  Well nourished, well developed, in no acute distress, remains anxious HEENT: normal Lymph: no adenopathy Neck: Mild  JVD Endocrine:  No thryomegaly Vascular: No carotid bruits; FA pulses 2+ bilaterally without bruits  Cardiac:  normal S1, S2; RRR 1/6 systolic murmur,  Lungs:  Essentially CTA, mild crackles in the bases Abd: soft, nontender, no hepatomegaly Ventral hernia is noted.  Ext: 2+ pre-tibial edema Musculoskeletal:  No deformities, BUE and BLE strength normal and  equal Skin: warm and dry  Neuro:  CNs 2-12 intact, no focal abnormalities noted Psych:  Normal affect   EKG:  Current NSR with intraventricular conduction delay, inferior/lateral T-wave inversion.  Telemetry:  Telemetry was personally reviewed and demonstrates:  SR with salvo's of atrial tachycardia,   Relevant CV Studies: Echocardiogram 11/11/2016  Left ventricle: The cavity size was normal. Wall thickness was   increased in a pattern of moderate LVH. Systolic function was   moderately to severely reduced. The estimated ejection fraction   was in the range of 30% to 35%. Features are consistent with a   pseudonormal left ventricular filling pattern, with concomitant   abnormal relaxation and increased filling pressure (grade 2   diastolic dysfunction). Doppler parameters are consistent with   high ventricular filling pressure. - Regional wall motion abnormality: Hypokinesis of the mid inferior   and basal-mid inferolateral myocardium. - Aortic valve: Mildly calcified annulus. Trileaflet; mildly   thickened leaflets. Valve area (VTI): 1.57 cm^2. Valve area   (Vmax): 1.57 cm^2. Valve area (Vmean): 1.51 cm^2. - Mitral valve: Mildly calcified annulus. Mildly thickened leaflets   . There was mild regurgitation. - Left atrium: The atrium was severely dilated. - Right atrium: The atrium was moderately dilated. - Pulmonary arteries: Systolic pressure was moderately increased.   PA peak pressure: 44 mm Hg (S).  Cardiac Catheterization 12/02/2015  Mid LAD lesion, 60 %stenosed.  Prox Cx to Mid Cx lesion, 99 %stenosed.  Mid RCA lesion, 0 %stenosed at site of prior stent  Dist RCA lesion, 30%stenosed at site of prior stent.  Prox RCA lesion, 40 %stenosed.  Ost Cx to Prox Cx lesion, 80 %stenosed.  LV end diastolic pressure is moderately elevated.   1. 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 Patent stents in the mid RCA and distal RCA/PDA 3. Moderate mid  LAD disease- unchanged from prior studies. 4. Elevated LVEDP  Plan: medical therapy with diuresis for CHF.     Laboratory Data:  Chemistry Recent Labs Lab 11/12/16 0704 11/13/16 0736 11/14/16 0409  NA 133* 137 134*  K 4.1 4.4 4.2  CL 96* 98* 97*  CO2 24 25 25   GLUCOSE 173* 118* 176*  BUN 67* 71* 71*  CREATININE 2.79* 2.90* 2.95*  CALCIUM 9.1 9.6 9.3  GFRNONAA 16* 15* 15*  GFRAA 19* 18* 17*  ANIONGAP 13 14 12      Recent Labs Lab 11/12/16 0704 11/13/16 0736  PROT 7.6 7.7  ALBUMIN 3.4* 3.4*  AST 66* 64*  ALT 59* 61*  ALKPHOS 157* 179*  BILITOT 0.7 0.6   Hematology Recent Labs Lab 11/12/16 0704 11/13/16 0736 11/14/16 0409  WBC 7.6 8.0 6.5  RBC 2.90* 2.80* 2.75*  HGB 9.0* 8.9* 8.9*  HCT 27.8* 27.0* 26.5*  MCV 95.9 96.4 96.4  MCH 31.0 31.8 32.4  MCHC 32.4 33.0 33.6  RDW 16.3* 16.4* 16.6*  PLT 352 360 344   Cardiac EnzymesNo results for input(s): TROPONINI in the last 168 hours. No results for input(s): TROPIPOC in the last 168 hours.  BNP: 648.0   Radiology/Studies:  Dg Chest Port 1 View  Result Date: 11/10/2016 CLINICAL DATA:  Shortness of breath tonight EXAM: PORTABLE CHEST 1 VIEW COMPARISON:  October 31, 2016 FINDINGS: The mediastinal contour is normal. The heart size is mildly enlarged. There is a small right pleural effusion with patchy consolidation of right lung base. The left lung is clear. No acute abnormalities identified in the visualized bones. IMPRESSION: Small right pleural effusion with patchy consolidation of right lung base, underline pneumonia is not excluded. Electronically Signed   By: Abelardo Diesel M.D.   On: 11/10/2016 21:28    Assessment and Plan:   1. Paroxysmal Atrial fib with RVR: Patient is now essentially rate controlled with metoprolol 50 mg XL . Would not recommend CCB be restarted in the setting of reduced EF of 35%. She is now on Xarelto 15 mg daily, CHADS VASC Score of 6-7 She is having salvo's of atrial tachycardia. Will  increase metoprolol to 75 mg daily. Will stop ASA as she is in on DOAC.   2. Systolic Dysfunction with CHF: EF reduced from 45%-50% to 30-35% per echo this admission. Creatinine of 2.95 compared to 2.62 on admission. On lasix 80 mg IV BID. Urine output has not been recorded. Will begin strict I/O and daily wts. According to weights recorded she is gaining weight from 203 to 207 lbs. Cannot start ACE, ARB, or Entresto with CKD.   3. CAD: Stents to the the mid LAD, CTO of the left Cx, with non-obstructive RCA stenosis. She is on DAPT with Brilinta. Will d/c ASA. Continue secondary prevention with BB, statin.   4. Hypertension: Not optimal for reduced EF currently. Will increase hydralazine to 100 mg TID.   5. CKD: Stage IV:  Recommend nephrology consult if clinically warranted.  6. Diabetes: Glucose elevated on admission. Defer to PCP team. .      For questions or updates, please contact [place] Please consult [email or web address] for contact info under Cardiology/STEMI. Daytime calls, contact the Day Call APP (6a-8a) or assigned team (Teams A-D) provider (7:30a - 5p). All other daytime calls (7:30-5p), contact the Card Master @ [phone].   Nighttime calls, contact the assigned APP (5p-8p) or MD (6:30p-8p). Overnight calls (8p-6a), contact the on call Fellow @ [phone].   Signed, Jory Sims, NP  11/14/2016 9:35 AM   The patient was seen and examined, and I agree with the history, physical exam, assessment and plan as documented above, with modifications as noted below. I have also personally reviewed all relevant documentation, old records, labs, and both radiographic and cardiovascular studies. I have also independently interpreted old and new ECG's.  71 yr old woman well known to me who presented with weakness, anxiety, and dizziness which begain while in a laundromat with her daughter after discussing her husband's healthcare plan which made her very upset.  She was  found to be in new onset rapid atrial fibrillation (ECG 119 bpm). She was given AV nodal blockers and subsequently converted to sinus rhythm. She was started on Xarelto 15 mg daily. GFR is currently 17 ml/min. She has also been on ASA and full strength Brilinta.  Echocardiogram demonstrates a decline in LVEF to 30-35%, previously 45-50% on 07/11/16.  She currently denies chest pain and shortness of breath and primarily complains of fatigue.  She has CAD and underwent right subclavian artery thromboembolectomy on 07/10/15.  Recommendations: Given severely reduced GFR, the safest anticoagulation option for her is warfarin, which I will initiate.  I will reduce Brilinta to 60 mg bid. Will stop ASA. Will need close monitoring of Hgb given chronic anemia (currently 8.9).   As LVEF has significantly declined and given her inferior wall motion abnormalities (only lateral wall abnormalities seen on 07/11/16 echo), I wonder if she has occluded her RCA as well.  I will arrange for Lexiscan Myoview stress test on 11/15/16.  While I am not eager to pursue coronary angiography given her CKD stage IV and high likelihood for development of contrast-mediated nephropathy and need for dialysis, I want to assess her ischemic burden.  Will increase Toprol-XL to suppress atrial runs and increase hydralazine for more optimal BP control (goal SBP </= 130 mmHg).  Continue Crestor and nitrates.   Kate Sable, MD, [facility]  11/14/2016 11:06 AM

## 2016-11-14 NOTE — Progress Notes (Signed)
[place] for WARFARIN Indication: atrial fibrillation  Allergies  Allergen Reactions  . Ace Inhibitors Cough  . Amlodipine     Edema   . Codeine Rash   Patient Measurements: Height: 5\' 8"  (172.7 cm) Weight: 207 lb 1.6 oz (93.9 kg) IBW/kg (Calculated) : 63.9 HEPARIN DW (KG): 83.6  Vital Signs: Temp: 98.4 F (36.9 C) (09/10 0549) Temp Source: Oral (09/10 0549) BP: 160/73 (09/10 0549) Pulse Rate: 84 (09/10 0549)  Labs:  Recent Labs  11/12/16 0704 11/13/16 0736 11/14/16 0409  HGB 9.0* 8.9* 8.9*  HCT 27.8* 27.0* 26.5*  PLT 352 360 344  CREATININE 2.79* 2.90* 2.95*   Estimated Creatinine Clearance: 21 mL/min (A) (by C-G formula based on SCr of 2.95 mg/dL (H)).  Medical History: Past Medical History:  Diagnosis Date  . Acute renal failure superimposed on stage 4 chronic kidney disease ([place]) 07/04/2015  . Allergic rhinitis   . Anemia   . Anxiety   . CAD in native artery    NSTEMI 07/2015 s/p DES to RCA and posterior PDA, PCI 10/2015 with scoring balloon to 85% ISR of distal RCA), known LAD/Cx disease treated medically  . Carotid artery disease ([place])    Mild bilateral carotid disease (1-39% 07/2015)  . Chronic combined systolic and diastolic CHF (congestive heart failure) ([place])   . CKD (chronic kidney disease), stage IV ([place])   . Constipation   . Diabetes mellitus   . GERD (gastroesophageal reflux disease)   . Heart murmur, systolic   . History of hysterectomy   . Hyperlipidemia   . Hypertension   . Low back pain   . Obesity   . 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 Osteoarthritis   . Overactive bladder   . PVC's (premature ventricular contractions)    Prescriptions Prior to Admission  Medication Sig Dispense Refill Last Dose  . acetaminophen (TYLENOL) 500 MG tablet Take 2 tablets (1,000 mg total) by mouth every 6 (six) hours as needed  (pain). 30 tablet 0 11/10/2016 at 1700  . albuterol (PROVENTIL HFA;VENTOLIN HFA) 108 (90 Base) MCG/ACT inhaler Inhale 1-2 puffs into the lungs every 6 (six) hours as needed for wheezing or shortness of breath. 1 Inhaler 0 11/10/2016 at Unknown time  . albuterol (PROVENTIL) (2.5 MG/3ML) 0.083% nebulizer solution Take 3 mLs (2.5 mg total) by nebulization every 6 (six) hours as needed for wheezing or shortness of breath. 75 mL 1 11/10/2016 at Unknown time  . aspirin EC 81 MG tablet Take 81 mg by mouth daily.   11/10/2016 at Unknown time  . Continuous Blood Gluc Sensor (FREESTYLE LIBRE SENSOR SYSTEM) MISC Use one sensor every 10 days for continuous glucose monitoring. 3 each 3 11/10/2016 at Unknown time  . cyclobenzaprine (FLEXERIL) 5 MG tablet Take 5 mg by mouth at bedtime.  0 11/09/2016 at Unknown time  . diazepam (VALIUM) 5 MG tablet Take 1 tablet (5 mg total) by mouth every 12 (twelve) hours as needed for muscle spasms. 10 tablet 0 11/09/2016 at Unknown time  . ferrous sulfate 325 (65 FE) MG tablet Take 325 mg by mouth daily with breakfast.    Past Month at Unknown time  . fluticasone (FLONASE) 50 MCG/ACT nasal spray Place 1 spray into both nostrils daily.   11/10/2016 at Unknown time  . hydrALAZINE (APRESOLINE) 25 MG tablet Take 2 tablets (50 mg total) by mouth 3 (three) times daily.  11/10/2016 at 1000  . HYDROcodone-homatropine (HYCODAN) 5-1.5 MG/5ML syrup Take 5 mLs by mouth every 6 (six) hours as needed for cough.   0 Past Month at Unknown time  . insulin degludec (TRESIBA) 100 UNIT/ML SOPN FlexTouch Pen Inject 40 Units into the skin at bedtime.    11/09/2016 at Unknown time  . insulin lispro (HUMALOG KWIKPEN) 100 UNIT/ML KiwkPen INJECT 18 TO 24 UNITS TOTAL INTO THE SKIN THREE TIMES DAILY 15 mL 2 11/10/2016 at 1330  . isosorbide mononitrate (IMDUR) 60 MG 24 hr tablet Take 1 tablet (60 mg total) by mouth daily. 30 tablet 0 11/10/2016 at Unknown time  . latanoprost (XALATAN) 0.005 % ophthalmic solution Place 1 drop into  both eyes at bedtime.    11/09/2016 at Unknown time  . meclizine (ANTIVERT) 25 MG tablet Take 12.5 mg by mouth 2 (two) times daily.    11/10/2016 at Unknown time  . metoprolol succinate (TOPROL-XL) 50 MG 24 hr tablet TAKE ONE TABLET BY MOUTH TWICE DAILY WITH  OR  IMMEDIATELY  FOLLOWING  A  MEAL 60 tablet 5 11/10/2016 at 1330  . Multiple Vitamin (MULTIVITAMIN WITH MINERALS) TABS tablet Take 1 tablet by mouth daily.   Past Week at Unknown time  . nitroGLYCERIN (NITROSTAT) 0.4 MG SL tablet Place 1 tablet (0.4 mg total) under the tongue every 5 (five) minutes as needed for chest pain. 25 tablet 2 unknown  . pantoprazole (PROTONIX) 40 MG tablet Take 40 mg by mouth daily.    11/10/2016 at Unknown time  . potassium chloride SA (K-DUR,KLOR-CON) 20 MEQ tablet Take 2 tablets (40 mEq total) by mouth daily. 60 tablet 5 11/09/2016 at Unknown time  . RANEXA 500 MG 12 hr tablet TAKE ONE TABLET BY MOUTH TWICE DAILY 60 tablet 5 11/10/2016 at Unknown time  . rosuvastatin (CRESTOR) 40 MG tablet Take 1 tablet (40 mg total) by mouth at bedtime. 30 tablet 0 11/09/2016 at Unknown time  . ticagrelor (BRILINTA) 90 MG TABS tablet Take 1 tablet (90 mg total) by mouth 2 (two) times daily. 60 tablet 0 11/10/2016 at 1000  . torsemide (DEMADEX) 20 MG tablet Take 60 mg by mouth 2 (two) times daily. Pt is able to take an additional tablet daily for weight gain greater than 3lbs.   11/10/2016 at 1000  . trolamine salicylate (ASPERCREME) 10 % cream Apply 1 application topically as needed for muscle pain.   11/09/2016 at Unknown time   Assessment: 71 yo female with significant cardiac history including CHF, CAD s/p PCI 2018; CKD stage 4; now with new onset Afib (nonvalvular).  Pt was initially started on rivaroxaban but due to renal fxn being switched to WARFARIN.  Goal of Therapy:  Atrial fibrillation, stroke prophylaxis  Plan:  Coumadin 5mg  po today x 1 INR daily Provide education Monitor for s/sx bleeding complications  Hart Robinsons A,  RPH 11/14/2016,11:13 AM

## 2016-11-14 NOTE — Progress Notes (Signed)
PHARMACY NOTE:  ANTIMICROBIAL RENAL DOSAGE ADJUSTMENT  Current antimicrobial regimen includes a mismatch between antimicrobial dosage and estimated renal function.  As per policy approved by the Pharmacy & Therapeutics and Medical Executive Committees, the antimicrobial dosage will be adjusted accordingly.  Current antimicrobial dosage:  Ceftin 500mg  po BID  Indication: UTI  Renal Function: Estimated Creatinine Clearance: 21 mL/min (A) (by C-G formula based on SCr of 2.95 mg/dL (H)).    Antimicrobial dosage has been changed to:  Ceftin 500mg  PO once daily  Thank you for allowing pharmacy to be a part of this patient's care.  Ena Dawley, [facility] 11/14/2016 12:39 PM

## 2016-11-14 NOTE — Progress Notes (Addendum)
[place] TEAM 2  [name]  WFU:[id number] DOB: 1945-06-14 DOA: 11/10/2016 PCP: [name], MD    Brief Narrative:  70yoFwith a history of diastolic CHF, CKD (baseline cr around 2), DM, CAD, and anxiety, recently hospitalized for large right-sided pleural effusion c/w transudative physiology attributed it to her CHF who presented to the ED c/o anxiousness and weakness.  Patient was found to be in new onset atrial fibrillation with rapid ventricular response. While in the ED she converted back to normal sinus rhythm.  Subjective: Patient went back into atrial fibrillation last night, currently rate controlled  Assessment & Plan:  New onset atrial fibrillation with RVR Mg is normal - TSH is normal - patient transition to normal sinus rhythm but went back into atrial fibrillation last night.- CHA2DS2 VASc score is 5 - pt now on anticoag Xarelto-now changed to coumadin by cards , patient has also been on aspirin and Brilinta,reduce Brilinta to 60 mg bid. Will stop ASA.   Chronic combined systolic and diastolic CHF - acute worsening  TTE this admit notes EF 30-35% w/ grade 2 DD - last TTE in May 2018 noted EF 45-50% grade 2 DD - baseline wgt appears to be 89-91kg - diuresis was held yesterday, and pt was hydrated gently - her renal fxn has not improved with this - stopped  IVF today and follow w/ resumption of diuretic -   ,increase Toprol-XL to suppress atrial runs and increase hydralazine for more optimal BP control   Filed Weights   11/12/16 0500 11/13/16 0441 11/14/16 0500  Weight: 91.9 kg (202 lb 9 oz) 94.1 kg (207 lb 6.4 oz) 93.9 kg (207 lb 1.6 oz)   Acute kidney injury on chronic kidney disease stage II Baseline creatinine 2.0 - did not improve w/gentle volume expansion yesterday - perhaps she suffered a period of poor perfusion in setting of Afib w/ RVR - cr up but stable this admit , diurese as per cardiology  Recent Labs Lab 11/10/16 1902 11/11/16 0530 11/12/16 0704  11/13/16 0736 11/14/16 0409  CREATININE 2.82* 2.62* 2.79* 2.90* 2.95*    CAD w/ stents in mid RCA and distal RCA/PDA Last cardiac cath Sept 2017 - denies SSCP but EF has worsened - Ranexa stopped in setting of worsening renal fxn  Lexiscan Myoview stress test on 11/15/16. coronary angiography given her CKD stage IV and high likelihood for development of contrast-mediated nephropathy and need for dialysis  Subclavian artery occlusion S/p thromboembolectomy - continue aspirin and Brillinta  Anemia of chronic disease No evidence of acute blood loss - likely due to chronic kidney disease - follow trend   E coli UTI Transition to oral abx to complete a 7 day tx course   DM 2 Poorly controlled - A1c 8.8 10/31/16 - a   Generalized anxiety disorder Appears well compensated at present   DVT prophylaxis: xarelto  Code Status: FULL CODE Family Communication: spoke w/ daughters at bedside   Disposition Plan: tele   Consultants:  None - plan to consult Cards 9/10  Procedures: TTE  Antimicrobials:  [place] 9/8 >  Objective: Blood pressure (!) 160/73, pulse 84, temperature 98.4 F (36.9 C), temperature source Oral, resp. rate 20, height 5\' 8"  (1.727 m), weight 93.9 kg (207 lb 1.6 oz), SpO2 99 %.  Intake/Output Summary (Last 24 hours) at 11/14/16 1314 Last data filed at 11/14/16 0943  Gross per 24 hour  Intake              480  ml  Output                0 ml  Net              480 ml   Filed Weights   11/12/16 0500 11/13/16 0441 11/14/16 0500  Weight: 91.9 kg (202 lb 9 oz) 94.1 kg (207 lb 6.4 oz) 93.9 kg (207 lb 1.6 oz)    Examination: General: No acute respiratory distress - alert and oriented  Lungs: CTA B w/o wheezing or focal crackles  Cardiovascular: Regular rate and rhythm w/o M or rub  Abdomen: Nontender, nondistended, soft, bowel sounds positive, no rebound Extremities: 1+ B LE edema w/o cyanosis   CBC:  Recent Labs Lab 11/10/16 1902 11/11/16 0530  11/12/16 0704 11/13/16 0736 11/14/16 0409  WBC 7.1 5.5 7.6 8.0 6.5  HGB 8.8* 8.4* 9.0* 8.9* 8.9*  HCT 27.5* 26.3* 27.8* 27.0* 26.5*  MCV 98.2 96.3 95.9 96.4 96.4  PLT 341 311 352 360 462   Basic Metabolic Panel:  Recent Labs Lab 11/10/16 1902 11/11/16 0530 11/12/16 0704 11/13/16 0736 11/14/16 0409  NA 136 136 133* 137 134*  K 3.6 3.7 4.1 4.4 4.2  CL 95* 98* 96* 98* 97*  CO2 29 27 24 25 25   GLUCOSE 215* 172* 173* 118* 176*  BUN 65* 66* 67* 71* 71*  CREATININE 2.82* 2.62* 2.79* 2.90* 2.95*  CALCIUM 9.6 8.9 9.1 9.6 9.3  MG  --  2.2  --  2.3  --   PHOS  --   --   --  3.6  --    GFR: Estimated Creatinine Clearance: 21 mL/min (A) (by C-G formula based on SCr of 2.95 mg/dL (H)).  Liver Function Tests:  Recent Labs Lab 11/12/16 0704 11/13/16 0736  AST 66* 64*  ALT 59* 61*  ALKPHOS 157* 179*  BILITOT 0.7 0.6  PROT 7.6 7.7  ALBUMIN 3.4* 3.4*    HbA1C: Hemoglobin A1C  Date/Time Value Ref Range Status  09/28/2016 9.6  Final   Hgb A1c MFr Bld  Date/Time Value Ref Range Status  10/31/2016 05:30 PM 8.8 (H) 4.8 - 5.6 % Final    Comment:    (NOTE) Pre diabetes:          5.7%-6.4% Diabetes:              >6.4% Glycemic control for   <7.0% adults with diabetes   07/09/2016 02:40 PM 10.3 (H) 4.8 - 5.6 % Final    Comment:    (NOTE)         Pre-diabetes: 5.7 - 6.4         Diabetes: >6.4         Glycemic control for adults with diabetes: <7.0     CBG:  Recent Labs Lab 11/13/16 1135 11/13/16 1638 11/13/16 2058 11/14/16 0739 11/14/16 1107  GLUCAP 208* 207* 250* 162* 284*    Recent Results (from the past 240 hour(s))  Urine culture     Status: Abnormal   Collection Time: 11/10/16  8:55 PM  Result Value Ref Range Status   Specimen Description URINE, CLEAN CATCH  Final   Special Requests NONE  Final   Culture 50,000 COLONIES/mL ESCHERICHIA COLI (A)  Final   Report Status 11/13/2016 FINAL  Final   Organism ID, Bacteria ESCHERICHIA COLI (A)  Final       Susceptibility   Escherichia coli - MIC*    AMPICILLIN >=32 RESISTANT Resistant     CEFAZOLIN <=4 SENSITIVE  Sensitive     CEFTRIAXONE <=1 SENSITIVE Sensitive     CIPROFLOXACIN >=4 RESISTANT Resistant     GENTAMICIN <=1 SENSITIVE Sensitive     IMIPENEM <=0.25 SENSITIVE Sensitive     NITROFURANTOIN <=16 SENSITIVE Sensitive     TRIMETH/SULFA >=320 RESISTANT Resistant     AMPICILLIN/SULBACTAM >=32 RESISTANT Resistant     PIP/TAZO 64 INTERMEDIATE Intermediate     Extended ESBL NEGATIVE Sensitive     * 50,000 COLONIES/mL ESCHERICHIA COLI  MRSA PCR Screening     Status: None   Collection Time: 11/11/16  8:02 AM  Result Value Ref Range Status   MRSA by PCR NEGATIVE NEGATIVE Final    Comment:        The GeneXpert MRSA Assay (FDA approved for NASAL specimens only), is one component of a comprehensive MRSA colonization surveillance program. It is not intended to diagnose MRSA infection nor to guide or monitor treatment for MRSA infections.      Scheduled Meds: . [START ON 11/15/2016] cefUROXime  500 mg Oral Q breakfast  . ferrous sulfate  325 mg Oral Q breakfast  . fluticasone  1 spray Each Nare Daily  . furosemide  80 mg Intravenous Q8H  . hydrALAZINE  100 mg Oral TID  . insulin aspart  0-5 Units Subcutaneous QHS  . insulin aspart  0-9 Units Subcutaneous TID WC  . insulin glargine  34 Units Subcutaneous QHS  . isosorbide mononitrate  60 mg Oral Daily  . latanoprost  1 drop Both Eyes QHS  . meclizine  12.5 mg Oral BID  . [START ON 11/15/2016] metoprolol succinate  75 mg Oral Daily  . multivitamin with minerals  1 tablet Oral Daily  . pantoprazole  40 mg Oral Daily  . polyethylene glycol  17 g Oral BID  . rosuvastatin  40 mg Oral QHS  . senna-docusate  1 tablet Oral BID  . ticagrelor  60 mg Oral BID  . warfarin  5 mg Oral Once  . warfarin   Does not apply Once  . Warfarin - Pharmacist Dosing Inpatient   Does not apply Q24H     LOS: 1 day     On-Call/Text Page:       [email or web address]      password [email or web address]  If 7PM-7AM, please contact night-coverage [email or web address] Password [email or web address] 11/14/2016, 1:14 PM

## 2016-11-14 NOTE — Progress Notes (Signed)
Inpatient Diabetes Program Recommendations  AACE/ADA: New Consensus Statement on Inpatient Glycemic Control (2015)  Target Ranges:  Prepandial:   less than 140 mg/dL      Peak postprandial:   less than 180 mg/dL (1-2 hours)      Critically ill patients:  140 - 180 mg/dL   Results for NANCE, MCCOMBS (MRN [id number]) as of 11/14/2016 13:44  Ref. Range 11/13/2016 08:19 11/13/2016 11:35 11/13/2016 16:38 11/13/2016 20:58 11/14/2016 07:39 11/14/2016 11:07  Glucose-Capillary Latest Ref Range: 65 - 99 mg/dL 116 (H) 208 (H) 207 (H) 250 (H) 162 (H) 284 (H)   Review of Glycemic Control  Diabetes history: DM2 Outpatient Diabetes medications: Tresiba 40 units daily, Humalog 18-24 units TID with meals Current orders for Inpatient glycemic control: Lantus 34 units QHS, Novolog 0-9 units TID with meals, Novolog 0-5 units QHS  Inpatient Diabetes Program Recommendations: Insulin - Meal Coverage: Please consider ordering Novolog 4 units TID with meals for meal coverage if patient eats at least 50% of meals.  Thanks, Barnie Alderman, RN, MSN, CDE Diabetes Coordinator Inpatient Diabetes Program [phone] (Team Pager from 8am to 5pm)

## 2016-11-15 ENCOUNTER — Inpatient Hospital Stay (HOSPITAL_BASED_OUTPATIENT_CLINIC_OR_DEPARTMENT_OTHER): Payer: BLUE CROSS/BLUE SHIELD

## 2016-11-15 DIAGNOSIS — I4891 Unspecified atrial fibrillation: Secondary | ICD-10-CM | POA: Diagnosis not present

## 2016-11-15 DIAGNOSIS — I519 Heart disease, unspecified: Secondary | ICD-10-CM

## 2016-11-15 DIAGNOSIS — R9439 Abnormal result of other cardiovascular function study: Secondary | ICD-10-CM

## 2016-11-15 DIAGNOSIS — F411 Generalized anxiety disorder: Secondary | ICD-10-CM

## 2016-11-15 LAB — COMPREHENSIVE METABOLIC PANEL
ALK PHOS: 169 U/L — AB (ref 38–126)
ALT: 54 U/L (ref 14–54)
ANION GAP: 14 (ref 5–15)
AST: 51 U/L — ABNORMAL HIGH (ref 15–41)
Albumin: 3.2 g/dL — ABNORMAL LOW (ref 3.5–5.0)
BILIRUBIN TOTAL: 0.6 mg/dL (ref 0.3–1.2)
BUN: 65 mg/dL — ABNORMAL HIGH (ref 6–20)
CALCIUM: 9.1 mg/dL (ref 8.9–10.3)
CO2: 24 mmol/L (ref 22–32)
Chloride: 96 mmol/L — ABNORMAL LOW (ref 101–111)
Creatinine, Ser: 2.87 mg/dL — ABNORMAL HIGH (ref 0.44–1.00)
GFR, EST AFRICAN AMERICAN: 18 mL/min — AB (ref >60)
GFR, EST NON AFRICAN AMERICAN: 15 mL/min — AB (ref >60)
Glucose, Bld: 127 mg/dL — ABNORMAL HIGH (ref 65–99)
POTASSIUM: 3.3 mmol/L — AB (ref 3.5–5.1)
Sodium: 134 mmol/L — ABNORMAL LOW (ref 135–145)
TOTAL PROTEIN: 7.6 g/dL (ref 6.5–8.1)

## 2016-11-15 LAB — GLUCOSE, CAPILLARY
GLUCOSE-CAPILLARY: 155 mg/dL — AB (ref 65–99)
GLUCOSE-CAPILLARY: 196 mg/dL — AB (ref 65–99)
Glucose-Capillary: 111 mg/dL — ABNORMAL HIGH (ref 65–99)
Glucose-Capillary: 274 mg/dL — ABNORMAL HIGH (ref 65–99)

## 2016-11-15 LAB — NM MYOCAR MULTI W/SPECT W/WALL MOTION / EF
CHL CUP NUCLEAR SRS: 26
CHL CUP NUCLEAR SSS: 34
CSEPPHR: 101 {beats}/min
LV dias vol: 136 mL (ref 46–106)
LV sys vol: 108 mL
RATE: 0.51
Rest HR: 94 {beats}/min
SDS: 8
TID: 1.08

## 2016-11-15 LAB — PROTIME-INR
INR: 1.59
Prothrombin Time: 18.8 seconds — ABNORMAL HIGH (ref 11.4–15.2)

## 2016-11-15 LAB — MAGNESIUM: MAGNESIUM: 2.3 mg/dL (ref 1.7–2.4)

## 2016-11-15 MED ORDER — INSULIN GLARGINE 100 UNIT/ML ~~LOC~~ SOLN
5.0000 [IU] | Freq: Once | SUBCUTANEOUS | Status: AC
  Administered 2016-11-15: 5 [IU] via SUBCUTANEOUS
  Filled 2016-11-15: qty 0.05

## 2016-11-15 MED ORDER — POTASSIUM CHLORIDE CRYS ER 20 MEQ PO TBCR
40.0000 meq | EXTENDED_RELEASE_TABLET | Freq: Two times a day (BID) | ORAL | Status: DC
  Administered 2016-11-15 – 2016-11-16 (×2): 40 meq via ORAL
  Filled 2016-11-15 (×2): qty 2

## 2016-11-15 MED ORDER — INSULIN GLARGINE 100 UNIT/ML ~~LOC~~ SOLN
38.0000 [IU] | Freq: Every day | SUBCUTANEOUS | Status: DC
  Administered 2016-11-15: 38 [IU] via SUBCUTANEOUS
  Filled 2016-11-15 (×2): qty 0.38

## 2016-11-15 MED ORDER — SODIUM CHLORIDE 0.9% FLUSH
INTRAVENOUS | Status: AC
  Administered 2016-11-15: 10 mL via INTRAVENOUS
  Filled 2016-11-15: qty 10

## 2016-11-15 MED ORDER — POTASSIUM CHLORIDE 20 MEQ PO PACK
40.0000 meq | PACK | Freq: Two times a day (BID) | ORAL | Status: DC
  Administered 2016-11-15: 40 meq via ORAL
  Filled 2016-11-15: qty 2

## 2016-11-15 MED ORDER — REGADENOSON 0.4 MG/5ML IV SOLN
INTRAVENOUS | Status: AC
  Administered 2016-11-15: 0.4 mg via INTRAVENOUS
  Filled 2016-11-15: qty 5

## 2016-11-15 MED ORDER — TECHNETIUM TC 99M TETROFOSMIN IV KIT
10.0000 | PACK | Freq: Once | INTRAVENOUS | Status: AC | PRN
  Administered 2016-11-15: 9 via INTRAVENOUS

## 2016-11-15 MED ORDER — TECHNETIUM TC 99M TETROFOSMIN IV KIT
30.0000 | PACK | Freq: Once | INTRAVENOUS | Status: AC | PRN
  Administered 2016-11-15: 30 via INTRAVENOUS

## 2016-11-15 MED ORDER — WARFARIN SODIUM 5 MG PO TABS
5.0000 mg | ORAL_TABLET | Freq: Once | ORAL | Status: AC
  Administered 2016-11-15: 5 mg via ORAL
  Filled 2016-11-15: qty 1

## 2016-11-15 NOTE — Progress Notes (Signed)
Inpatient Diabetes Program Recommendations  AACE/ADA: New Consensus Statement on Inpatient Glycemic Control (2015)  Target Ranges:  Prepandial:   less than 140 mg/dL      Peak postprandial:   less than 180 mg/dL (1-2 hours)      Critically ill patients:  140 - 180 mg/dL   Results for [name], [name] (MRN [id number]) as of 11/15/2016 11:38  Ref. Range 11/14/2016 07:39 11/14/2016 11:07 11/14/2016 16:28 11/14/2016 20:25 11/15/2016 07:27  Glucose-Capillary Latest Ref Range: 65 - 99 mg/dL 162 (H) 284 (H) 247 (H) 312 (H) 111 (H)   Review of Glycemic Control  Diabetes history: DM2 Outpatient Diabetes medications: Tresiba 40 units daily, Humalog 18-24 units TID with meals Current orders for Inpatient glycemic control: Lantus 38 units QHS, Novolog 0-9 units TID with meals, Novolog 0-5 units QHS  Inpatient Diabetes Program Recommendations: Insulin - Meal Coverage: Post prandial glucose is consistently elevated. Please consider ordering Novolog 5 units TID with meals for meal coverage if patient eats at least 50% of meals. Insulin-Basal: Noted Lantus was increased from 34 units to 38 units daily today. Fasting glucose was 162 mg/dl on 9/10 and 111 mg/dl with Lantus 34 units. May want to consider decreasing Lantus back down to Lantus 34 units QHS and order meal coverage which will provide insulin to cover elevated glucose after meal intake.  Thanks, Barnie Alderman, RN, MSN, CDE Diabetes Coordinator Inpatient Diabetes Program [phone] (Team Pager from 8am to 5pm)

## 2016-11-15 NOTE — Care Management Note (Signed)
Case Management Note  Patient Details  Name: [name] MRN: [id number] Date of Birth: 1945-06-23  If discussed at Long Length of Stay Meetings, dates discussed:  11/15/2016   Sherald Barge, Ava 11/15/2016, 12:24 PM

## 2016-11-15 NOTE — Progress Notes (Signed)
Progress Note  Patient Name: [name] Date of Encounter: 11/15/2016  Primary Cardiologist: (New) Bronson Ing   Subjective   Assesses prior to Martin stress test  She is tired and hungry   Inpatient Medications    Scheduled Meds: . cefUROXime  500 mg Oral Q breakfast  . ferrous sulfate  325 mg Oral Q breakfast  . fluticasone  1 spray Each Nare Daily  . furosemide  80 mg Intravenous Q8H  . hydrALAZINE  100 mg Oral TID  . insulin aspart  0-5 Units Subcutaneous QHS  . insulin aspart  0-9 Units Subcutaneous TID WC  . insulin glargine  38 Units Subcutaneous QHS  . insulin glargine  5 Units Subcutaneous Once  . isosorbide mononitrate  60 mg Oral Daily  . latanoprost  1 drop Both Eyes QHS  . meclizine  12.5 mg Oral BID  . metoprolol succinate  75 mg Oral Daily  . multivitamin with minerals  1 tablet Oral Daily  . pantoprazole  40 mg Oral Daily  . polyethylene glycol  17 g Oral BID  . rosuvastatin  40 mg Oral QHS  . senna-docusate  1 tablet Oral BID  . ticagrelor  60 mg Oral BID  . warfarin   Does not apply Once  . Warfarin - Pharmacist Dosing Inpatient   Does not apply Q24H   Continuous Infusions:  PRN Meds: acetaminophen, bisacodyl, bisacodyl, diazepam, HYDROcodone-homatropine, loratadine, menthol-cetylpyridinium, nitroGLYCERIN   Vital Signs    Vitals:   11/14/16 1437 11/14/16 2024 11/14/16 2045 11/15/16 0514  BP: (!) 116/54 (!) 153/72  127/64  Pulse: 68 80  91  Resp: 16 16  14   Temp: 98.7 F (37.1 C) 98.7 F (37.1 C)  100.3 F (37.9 C)  TempSrc: Oral Oral  Oral  SpO2: 100% 100% 95% 99%  Weight:    209 lb 3.2 oz (94.9 kg)  Height:        Intake/Output Summary (Last 24 hours) at 11/15/16 0824 Last data filed at 11/14/16 1700  Gross per 24 hour  Intake              960 ml  Output              400 ml  Net              560 ml   Filed Weights   11/13/16 0441 11/14/16 0500 11/15/16 0514  Weight: 207 lb 6.4 oz (94.1 kg) 207 lb 1.6 oz (93.9 kg) 209 lb  3.2 oz (94.9 kg)    Telemetry    SR with PAC's and PVC's   Physical Exam   GEN: Complaining of fatigue  Neck: No JVD Cardiac: RRR, distant heart sounds, occasional irregular no murmurs, rubs, or gallops.  Respiratory: Clear to auscultation bilaterally.Upper airway congestion, cleared with coughing.  GI: Soft, nontender, non-distended  MS: No edema; No deformity. Neuro:  Nonfocal  Psych: Normal affect, flat affect   Labs    Chemistry Recent Labs Lab 11/12/16 0704 11/13/16 0736 11/14/16 0409  NA 133* 137 134*  K 4.1 4.4 4.2  CL 96* 98* 97*  CO2 24 25 25   GLUCOSE 173* 118* 176*  BUN 67* 71* 71*  CREATININE 2.79* 2.90* 2.95*  CALCIUM 9.1 9.6 9.3  PROT 7.6 7.7  --   ALBUMIN 3.4* 3.4*  --   AST 66* 64*  --   ALT 59* 61*  --   ALKPHOS 157* 179*  --   BILITOT 0.7 0.6  --  GFRNONAA 16* 15* 15*  GFRAA 19* 18* 17*  ANIONGAP 13 14 12      Hematology Recent Labs Lab 11/12/16 0704 11/13/16 0736 11/14/16 0409  WBC 7.6 8.0 6.5  RBC 2.90* 2.80* 2.75*  HGB 9.0* 8.9* 8.9*  HCT 27.8* 27.0* 26.5*  MCV 95.9 96.4 96.4  MCH 31.0 31.8 32.4  MCHC 32.4 33.0 33.6  RDW 16.3* 16.4* 16.6*  PLT 352 360 344    Radiology    No results found.  Cardiac Studies  Echocardiogram 11/11/2016 Left ventricle: The cavity size was normal. Wall thickness was   increased in a pattern of moderate LVH. Systolic function was   moderately to severely reduced. The estimated ejection fraction   was in the range of 30% to 35%. Features are consistent with a   pseudonormal left ventricular filling pattern, with concomitant   abnormal relaxation and increased filling pressure (grade 2   diastolic dysfunction). Doppler parameters are consistent with   high ventricular filling pressure. - Regional wall motion abnormality: Hypokinesis of the mid inferior   and basal-mid inferolateral myocardium. - Aortic valve: Mildly calcified annulus. Trileaflet; mildly   thickened leaflets. Valve area (VTI):  1.57 cm^2. Valve area   (Vmax): 1.57 cm^2. Valve area (Vmean): 1.51 cm^2. - Mitral valve: Mildly calcified annulus. Mildly thickened leaflets   . There was mild regurgitation. - Left atrium: The atrium was severely dilated. - Right atrium: The atrium was moderately dilated. - Pulmonary arteries: Systolic pressure was moderately increased.   PA peak pressure: 44 mm Hg (S).   Nuclear stress test (11/15/16):   There was no ST segment deviation noted during stress. T wave inversions inferiorly.  Defect 1: There is a large defect of severe severity present in the basal inferolateral, basal anterolateral, mid anterior, mid anteroseptal, mid inferolateral, mid anterolateral, apical anterior, apical inferior, apical lateral and apex location.  This is a high risk study.  Findings consistent with prior myocardial infarction. No ischemic territories.  Nuclear stress EF: 21%.   Patient Profile     71 y.o. 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 Thereasa Solo   Assessment & Plan    1. Systolic Dysfunction: EF at 30-35% with evidence of mild fluid overload. Metoprolol was increased to 75 mg daily and taken off of CCB. Remains on lasix 80 mg IV BID. Urine output is not well recorded. She is positive on I/0. Weight is increasing. Will place on fluid restriction once she is no longer NPO.   2. CAD: Stents to the the mid LAD, CTO of the left Cx, with non-obstructive RCA stenosis. She is on  Brilinta at lower dose now at 60 mg BID, ASA has been d/c'd as she is now on coumadin. Stress test this am. Continue secondary prevention.   3. PAF: CHADS VASC Score of 6-7. She has been placed  on coumadin now due to reduced renal function and taken off of Xarelto. INR 1.59.   Signed, Phill Myron. West Pugh, ANP, AACC   11/15/2016, 8:24 AM    The patient was seen and examined, and I agree with the history, physical exam, assessment and plan as documented above, with modifications as noted  below.  She is currently asymptomatic with regards to chest pain and shortness of breath. She continues to have bilateral leg edema which is gradually resolving. Currently on IV Lasix 80 mg TID. BUN/Creatinine decreasing. Uncertain about I/O accuracy. BP is well controlled with addition of hydralazine. Nuclear stress test reviewed above, demonstrative of a large area of scar with no ischemia.  Recommendations: Continue warfarin for anticoagulation. Continue Brilinta, Toprol-XL, Crestor, and nitrates for CAD. Continue current diuretic regimen for now. I will arrange for outpatient EP evaluation for a defibrillator (AICD). I spoke at length to both her and her daughter about this.   Kate Sable, MD, [facility]  11/15/2016 12:10 PM

## 2016-11-15 NOTE — Progress Notes (Signed)
[place] for WARFARIN Indication: atrial fibrillation  Allergies  Allergen Reactions  . Ace Inhibitors Cough  . Amlodipine     Edema   . Codeine Rash   Patient Measurements: Height: 5\' 8"  (172.7 cm) Weight: 209 lb 3.2 oz (94.9 kg) IBW/kg (Calculated) : 63.9 HEPARIN DW (KG): 83.6  Vital Signs: Temp: 100.3 F (37.9 C) (09/11 0514) Temp Source: Oral (09/11 0514) BP: 127/64 (09/11 0514) Pulse Rate: 91 (09/11 0514)  Labs:  Recent Labs  11/13/16 0736 11/14/16 0409 11/15/16 0602 11/15/16 0609  HGB 8.9* 8.9*  --   --   HCT 27.0* 26.5*  --   --   PLT 360 344  --   --   LABPROT  --   --  18.8*  --   INR  --   --  1.59  --   CREATININE 2.90* 2.95*  --  2.87*   Estimated Creatinine Clearance: 21.7 mL/min (A) (by C-G formula based on SCr of 2.87 mg/dL (H)).  Medical History: Past Medical History:  Diagnosis Date  . Acute renal failure superimposed on stage 4 chronic kidney disease ([place]) 07/04/2015  . Allergic rhinitis   . Anemia   . Anxiety   . CAD in native artery    NSTEMI 07/2015 s/p DES to RCA and posterior PDA, PCI 10/2015 with scoring balloon to 85% ISR of distal RCA), known LAD/Cx disease treated medically  . Carotid artery disease ([place])    Mild bilateral carotid disease (1-39% 07/2015)  . Chronic combined systolic and diastolic CHF (congestive heart failure) ([place])   . CKD (chronic kidney disease), stage IV ([place])   . Constipation   . Diabetes mellitus   . GERD (gastroesophageal reflux disease)   . Heart murmur, systolic   . History of hysterectomy   . Hyperlipidemia   . Hypertension   . Low back pain   . Obesity   . 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 Osteoarthritis   . Overactive bladder   . PVC's (premature ventricular contractions)    Prescriptions Prior to Admission  Medication Sig Dispense Refill Last Dose  . acetaminophen  (TYLENOL) 500 MG tablet Take 2 tablets (1,000 mg total) by mouth every 6 (six) hours as needed (pain). 30 tablet 0 11/10/2016 at 1700  . albuterol (PROVENTIL HFA;VENTOLIN HFA) 108 (90 Base) MCG/ACT inhaler Inhale 1-2 puffs into the lungs every 6 (six) hours as needed for wheezing or shortness of breath. 1 Inhaler 0 11/10/2016 at Unknown time  . albuterol (PROVENTIL) (2.5 MG/3ML) 0.083% nebulizer solution Take 3 mLs (2.5 mg total) by nebulization every 6 (six) hours as needed for wheezing or shortness of breath. 75 mL 1 11/10/2016 at Unknown time  . aspirin EC 81 MG tablet Take 81 mg by mouth daily.   11/10/2016 at Unknown time  . Continuous Blood Gluc Sensor (FREESTYLE LIBRE SENSOR SYSTEM) MISC Use one sensor every 10 days for continuous glucose monitoring. 3 each 3 11/10/2016 at Unknown time  . cyclobenzaprine (FLEXERIL) 5 MG tablet Take 5 mg by mouth at bedtime.  0 11/09/2016 at Unknown time  . diazepam (VALIUM) 5 MG tablet Take 1 tablet (5 mg total) by mouth every 12 (twelve) hours as needed for muscle spasms. 10 tablet 0 11/09/2016 at Unknown time  . ferrous sulfate 325 (65 FE) MG tablet Take 325 mg by mouth daily with breakfast.    Past Month  at Unknown time  . fluticasone (FLONASE) 50 MCG/ACT nasal spray Place 1 spray into both nostrils daily.   11/10/2016 at Unknown time  . hydrALAZINE (APRESOLINE) 25 MG tablet Take 2 tablets (50 mg total) by mouth 3 (three) times daily.   11/10/2016 at 1000  . HYDROcodone-homatropine (HYCODAN) 5-1.5 MG/5ML syrup Take 5 mLs by mouth every 6 (six) hours as needed for cough.   0 Past Month at Unknown time  . insulin degludec (TRESIBA) 100 UNIT/ML SOPN FlexTouch Pen Inject 40 Units into the skin at bedtime.    11/09/2016 at Unknown time  . insulin lispro (HUMALOG KWIKPEN) 100 UNIT/ML KiwkPen INJECT 18 TO 24 UNITS TOTAL INTO THE SKIN THREE TIMES DAILY 15 mL 2 11/10/2016 at 1330  . isosorbide mononitrate (IMDUR) 60 MG 24 hr tablet Take 1 tablet (60 mg total) by mouth daily. 30 tablet 0  11/10/2016 at Unknown time  . latanoprost (XALATAN) 0.005 % ophthalmic solution Place 1 drop into both eyes at bedtime.    11/09/2016 at Unknown time  . meclizine (ANTIVERT) 25 MG tablet Take 12.5 mg by mouth 2 (two) times daily.    11/10/2016 at Unknown time  . metoprolol succinate (TOPROL-XL) 50 MG 24 hr tablet TAKE ONE TABLET BY MOUTH TWICE DAILY WITH  OR  IMMEDIATELY  FOLLOWING  A  MEAL 60 tablet 5 11/10/2016 at 1330  . Multiple Vitamin (MULTIVITAMIN WITH MINERALS) TABS tablet Take 1 tablet by mouth daily.   Past Week at Unknown time  . nitroGLYCERIN (NITROSTAT) 0.4 MG SL tablet Place 1 tablet (0.4 mg total) under the tongue every 5 (five) minutes as needed for chest pain. 25 tablet 2 unknown  . pantoprazole (PROTONIX) 40 MG tablet Take 40 mg by mouth daily.    11/10/2016 at Unknown time  . potassium chloride SA (K-DUR,KLOR-CON) 20 MEQ tablet Take 2 tablets (40 mEq total) by mouth daily. 60 tablet 5 11/09/2016 at Unknown time  . RANEXA 500 MG 12 hr tablet TAKE ONE TABLET BY MOUTH TWICE DAILY 60 tablet 5 11/10/2016 at Unknown time  . rosuvastatin (CRESTOR) 40 MG tablet Take 1 tablet (40 mg total) by mouth at bedtime. 30 tablet 0 11/09/2016 at Unknown time  . ticagrelor (BRILINTA) 90 MG TABS tablet Take 1 tablet (90 mg total) by mouth 2 (two) times daily. 60 tablet 0 11/10/2016 at 1000  . torsemide (DEMADEX) 20 MG tablet Take 60 mg by mouth 2 (two) times daily. Pt is able to take an additional tablet daily for weight gain greater than 3lbs.   11/10/2016 at 1000  . trolamine salicylate (ASPERCREME) 10 % cream Apply 1 application topically as needed for muscle pain.   11/09/2016 at Unknown time   Assessment: 71 yo female with significant cardiac history including CHF, CAD s/p PCI 2018; CKD stage 4; now with new onset Afib (nonvalvular).  Pt was initially started on rivaroxaban but due to renal fxn being switched to WARFARIN.  Goal of Therapy:  Atrial fibrillation, stroke prophylaxis  Plan:  Repeat Coumadin 5mg  po  today x 1 INR daily Provide education Monitor for s/sx bleeding complications  Pricilla Larsson, [facility] 11/15/2016,11:24 AM

## 2016-11-15 NOTE — Progress Notes (Addendum)
[place] TEAM 2  [name]  BJS:[id number] DOB: [birth year] DOA: 11/10/2016 PCP: [name], MD    Brief Narrative:  70yoFwith a history of diastolic CHF, CKD (baseline cr around 2), DM, CAD, and anxiety, recently hospitalized for large right-sided pleural effusion c/w transudative physiology attributed it to her CHF who presented to the ED c/o anxiousness and weakness.  Patient was found to be in new onset atrial fibrillation with rapid ventricular response. While in the ED she converted back to normal sinus rhythm. Subsequently developed permanent atrial fibrillation followed by cardiology  Subjective: Rate controlled overnight in the 70s-90's   Assessment & Plan:  New onset atrial fibrillation with RVR Mg is normal - TSH is normal - patient transition to normal sinus rhythm but went back into atrial fibrillation   CHA2DS2 VASc score is 5 - pt transitioned from  xarelto , changed to coumadin by cards , patient has also been on aspirin and Brilinta,reduce Brilinta to 60 mg bid. cardiology discontinued ASA. No need for lovenox bridge     Chronic combined systolic and diastolic CHF - acute worsening  TTE this admit notes EF 30-35% w/ grade 2 DD - last TTE in May 2018 noted EF 45-50% grade 2 DD - baseline wgt appears to be 89-91kg - diuresis was held   and pt was hydrated gently - her renal fxn did  not improve  with this - stopped  IVF  w/ resumption of diuretics, now on lasix 80 mg IV BID - Metoprolol was increased to 75 mg daily and taken off of CCB.  Also on hydralazine for more optimal BP control  Filed Weights   11/13/16 0441 11/14/16 0500 11/15/16 0514  Weight: 94.1 kg (207 lb 6.4 oz) 93.9 kg (207 lb 1.6 oz) 94.9 kg (209 lb 3.2 oz)      Acute kidney injury on chronic kidney disease stage II Baseline creatinine 2.0 - did not improve w/gentle volume expansion yesterday - perhaps she suffered a period of poor perfusion in setting of Afib w/ RVR - cr up but stable this admit  , diurese as per cardiology  Recent Labs Lab 11/11/16 0530 11/12/16 0704 11/13/16 0736 11/14/16 0409 11/15/16 0609  CREATININE 2.62* 2.79* 2.90* 2.95* 2.87*    CAD w/ stents in mid RCA and distal RCA/PDA Last cardiac cath Sept 2017 - denies SSCP but EF has worsened - Ranexa stopped in setting of worsening renal fxn  Lexiscan Myoview stress test on 11/15/16. coronary angiography given her CKD stage IV and high likelihood for development of contrast-mediated nephropathy and need for dialysis  Subclavian artery occlusion S/p thromboembolectomy - continue aspirin and Brillinta  Anemia of chronic disease No evidence of acute blood loss - likely due to chronic kidney disease - follow trend   E coli UTI Transition to oral abx to complete a 7 day tx course   DM 2 Poorly controlled - A1c 8.8 10/31/16 - a   Generalized anxiety disorder Appears well compensated at present     DVT prophylaxis: coumadin Code Status: FULL CODE Family Communication: spoke w/ daughters at bedside   Disposition Plan: dc likely in am   Consultants:   Cardiology  Procedures: TTE  Antimicrobials:  [place] 9/8 >  Objective: Blood pressure 127/64, pulse 91, temperature 100.3 F (37.9 C), temperature source Oral, resp. rate 14, height 5\' 8"  (1.727 m), weight 94.9 kg (209 lb 3.2 oz), SpO2 99 %.  Intake/Output Summary (Last 24 hours) at 11/15/16 1059 Last data  filed at 11/14/16 1700  Gross per 24 hour  Intake              480 ml  Output              200 ml  Net              280 ml   Filed Weights   11/13/16 0441 11/14/16 0500 11/15/16 0514  Weight: 94.1 kg (207 lb 6.4 oz) 93.9 kg (207 lb 1.6 oz) 94.9 kg (209 lb 3.2 oz)    Examination: General: No acute respiratory distress - alert and oriented  Lungs: CTA B w/o wheezing or focal crackles  Cardiovascular: Regular rate and rhythm w/o M or rub  Abdomen: Nontender, nondistended, soft, bowel sounds positive, no rebound Extremities: 1+ B LE edema  w/o cyanosis   CBC:  Recent Labs Lab 11/10/16 1902 11/11/16 0530 11/12/16 0704 11/13/16 0736 11/14/16 0409  WBC 7.1 5.5 7.6 8.0 6.5  HGB 8.8* 8.4* 9.0* 8.9* 8.9*  HCT 27.5* 26.3* 27.8* 27.0* 26.5*  MCV 98.2 96.3 95.9 96.4 96.4  PLT 341 311 352 360 786   Basic Metabolic Panel:  Recent Labs Lab 11/11/16 0530 11/12/16 0704 11/13/16 0736 11/14/16 0409 11/15/16 0609  NA 136 133* 137 134* 134*  K 3.7 4.1 4.4 4.2 3.3*  CL 98* 96* 98* 97* 96*  CO2 27 24 25 25 24   GLUCOSE 172* 173* 118* 176* 127*  BUN 66* 67* 71* 71* 65*  CREATININE 2.62* 2.79* 2.90* 2.95* 2.87*  CALCIUM 8.9 9.1 9.6 9.3 9.1  MG 2.2  --  2.3  --  2.3  PHOS  --   --  3.6  --   --    GFR: Estimated Creatinine Clearance: 21.7 mL/min (A) (by C-G formula based on SCr of 2.87 mg/dL (H)).  Liver Function Tests:  Recent Labs Lab 11/12/16 0704 11/13/16 0736 11/15/16 0609  AST 66* 64* 51*  ALT 59* 61* 54  ALKPHOS 157* 179* 169*  BILITOT 0.7 0.6 0.6  PROT 7.6 7.7 7.6  ALBUMIN 3.4* 3.4* 3.2*    HbA1C: Hemoglobin A1C  Date/Time Value Ref Range Status  09/28/2016 9.6  Final   Hgb A1c MFr Bld  Date/Time Value Ref Range Status  10/31/2016 05:30 PM 8.8 (H) 4.8 - 5.6 % Final    Comment:    (NOTE) Pre diabetes:          5.7%-6.4% Diabetes:              >6.4% Glycemic control for   <7.0% adults with diabetes   07/09/2016 02:40 PM 10.3 (H) 4.8 - 5.6 % Final    Comment:    (NOTE)         Pre-diabetes: 5.7 - 6.4         Diabetes: >6.4         Glycemic control for adults with diabetes: <7.0     CBG:  Recent Labs Lab 11/14/16 0739 11/14/16 1107 11/14/16 1628 11/14/16 2025 11/15/16 0727  GLUCAP 162* 284* 247* 312* 111*    Recent Results (from the past 240 hour(s))  Urine culture     Status: Abnormal   Collection Time: 11/10/16  8:55 PM  Result Value Ref Range Status   Specimen Description URINE, CLEAN CATCH  Final   Special Requests NONE  Final   Culture 50,000 COLONIES/mL ESCHERICHIA COLI  (A)  Final   Report Status 11/13/2016 FINAL  Final   Organism ID, Bacteria ESCHERICHIA COLI (  A)  Final      Susceptibility   Escherichia coli - MIC*    AMPICILLIN >=32 RESISTANT Resistant     CEFAZOLIN <=4 SENSITIVE Sensitive     CEFTRIAXONE <=1 SENSITIVE Sensitive     CIPROFLOXACIN >=4 RESISTANT Resistant     GENTAMICIN <=1 SENSITIVE Sensitive     IMIPENEM <=0.25 SENSITIVE Sensitive     NITROFURANTOIN <=16 SENSITIVE Sensitive     TRIMETH/SULFA >=320 RESISTANT Resistant     AMPICILLIN/SULBACTAM >=32 RESISTANT Resistant     PIP/TAZO 64 INTERMEDIATE Intermediate     Extended ESBL NEGATIVE Sensitive     * 50,000 COLONIES/mL ESCHERICHIA COLI  MRSA PCR Screening     Status: None   Collection Time: 11/11/16  8:02 AM  Result Value Ref Range Status   MRSA by PCR NEGATIVE NEGATIVE Final    Comment:        The GeneXpert MRSA Assay (FDA approved for NASAL specimens only), is one component of a comprehensive MRSA colonization surveillance program. It is not intended to diagnose MRSA infection nor to guide or monitor treatment for MRSA infections.      Scheduled Meds: . cefUROXime  500 mg Oral Q breakfast  . ferrous sulfate  325 mg Oral Q breakfast  . fluticasone  1 spray Each Nare Daily  . furosemide  80 mg Intravenous Q8H  . hydrALAZINE  100 mg Oral TID  . insulin aspart  0-5 Units Subcutaneous QHS  . insulin aspart  0-9 Units Subcutaneous TID WC  . insulin glargine  38 Units Subcutaneous QHS  . insulin glargine  5 Units Subcutaneous Once  . isosorbide mononitrate  60 mg Oral Daily  . latanoprost  1 drop Both Eyes QHS  . meclizine  12.5 mg Oral BID  . metoprolol succinate  75 mg Oral Daily  . multivitamin with minerals  1 tablet Oral Daily  . pantoprazole  40 mg Oral Daily  . polyethylene glycol  17 g Oral BID  . potassium chloride  40 mEq Oral BID  . rosuvastatin  40 mg Oral QHS  . senna-docusate  1 tablet Oral BID  . ticagrelor  60 mg Oral BID  . warfarin   Does not  apply Once  . Warfarin - Pharmacist Dosing Inpatient   Does not apply Q24H     LOS: 2 days     On-Call/Text Page:      [email or web address]      password [email or web address]  If 7PM-7AM, please contact night-coverage [email or web address] Password [email or web address] 11/15/2016, 10:59 AM

## 2016-11-16 ENCOUNTER — Other Ambulatory Visit: Payer: Self-pay | Admitting: *Deleted

## 2016-11-16 DIAGNOSIS — I5043 Acute on chronic combined systolic (congestive) and diastolic (congestive) heart failure: Secondary | ICD-10-CM

## 2016-11-16 DIAGNOSIS — I10 Essential (primary) hypertension: Secondary | ICD-10-CM

## 2016-11-16 LAB — BASIC METABOLIC PANEL
ANION GAP: 12 (ref 5–15)
BUN: 62 mg/dL — AB (ref 6–20)
CO2: 24 mmol/L (ref 22–32)
Calcium: 9.1 mg/dL (ref 8.9–10.3)
Chloride: 98 mmol/L — ABNORMAL LOW (ref 101–111)
Creatinine, Ser: 2.81 mg/dL — ABNORMAL HIGH (ref 0.44–1.00)
GFR calc Af Amer: 18 mL/min — ABNORMAL LOW (ref >60)
GFR calc non Af Amer: 16 mL/min — ABNORMAL LOW (ref >60)
GLUCOSE: 182 mg/dL — AB (ref 65–99)
Potassium: 3.9 mmol/L (ref 3.5–5.1)
Sodium: 134 mmol/L — ABNORMAL LOW (ref 135–145)

## 2016-11-16 LAB — GLUCOSE, CAPILLARY
GLUCOSE-CAPILLARY: 224 mg/dL — AB (ref 65–99)
Glucose-Capillary: 170 mg/dL — ABNORMAL HIGH (ref 65–99)
Glucose-Capillary: 196 mg/dL — ABNORMAL HIGH (ref 65–99)

## 2016-11-16 LAB — CBC
HEMATOCRIT: 27.7 % — AB (ref 36.0–46.0)
Hemoglobin: 8.8 g/dL — ABNORMAL LOW (ref 12.0–15.0)
MCH: 30.6 pg (ref 26.0–34.0)
MCHC: 31.8 g/dL (ref 30.0–36.0)
MCV: 96.2 fL (ref 78.0–100.0)
Platelets: 354 10*3/uL (ref 150–400)
RBC: 2.88 MIL/uL — ABNORMAL LOW (ref 3.87–5.11)
RDW: 16.8 % — AB (ref 11.5–15.5)
WBC: 8.1 10*3/uL (ref 4.0–10.5)

## 2016-11-16 LAB — PROTIME-INR
INR: 1.48
PROTHROMBIN TIME: 17.8 s — AB (ref 11.4–15.2)

## 2016-11-16 MED ORDER — METOPROLOL SUCCINATE ER 25 MG PO TB24: 75.0000 mg | ORAL_TABLET | Freq: Every day | ORAL | 2 refills | Status: DC

## 2016-11-16 MED ORDER — WARFARIN SODIUM 5 MG PO TABS
7.5000 mg | ORAL_TABLET | Freq: Once | ORAL | Status: AC
  Administered 2016-11-16: 7.5 mg via ORAL
  Filled 2016-11-16: qty 1

## 2016-11-16 MED ORDER — TICAGRELOR 60 MG PO TABS: 60.0000 mg | ORAL_TABLET | Freq: Two times a day (BID) | ORAL | 1 refills | Status: DC

## 2016-11-16 MED ORDER — CEFUROXIME AXETIL 500 MG PO TABS: 500.0000 mg | ORAL_TABLET | Freq: Every day | ORAL | 0 refills | Status: DC

## 2016-11-16 MED ORDER — HYDRALAZINE HCL 100 MG PO TABS: 100.0000 mg | ORAL_TABLET | Freq: Three times a day (TID) | ORAL | 1 refills | Status: DC

## 2016-11-16 MED ORDER — WARFARIN VIDEO
Freq: Once | Status: AC
  Administered 2016-11-16: 11:00:00

## 2016-11-16 MED ORDER — WARFARIN SODIUM 7.5 MG PO TABS: 7.5000 mg | ORAL_TABLET | Freq: Once | ORAL | 2 refills | Status: DC

## 2016-11-16 NOTE — Progress Notes (Signed)
Inpatient Diabetes Program Recommendations  AACE/ADA: New Consensus Statement on Inpatient Glycemic Control (2015)  Target Ranges:  Prepandial:   less than 140 mg/dL      Peak postprandial:   less than 180 mg/dL (1-2 hours)      Critically ill patients:  140 - 180 mg/dL   Results for ARYAA, BUNTING (MRN [id number]) as of 11/16/2016 10:05  Ref. Range 11/15/2016 07:27 11/15/2016 11:57 11/15/2016 16:38 11/15/2016 20:45 11/16/2016 07:33  Glucose-Capillary Latest Ref Range: 65 - 99 mg/dL 111 (H) 155 (H) 196 (H) 274 (H) 170 (H)   Review of Glycemic Control  Diabetes history:DM2 Outpatient Diabetes medications: Tresiba 40 units daily, Humalog 18-24 units TID with meals Current orders for Inpatient glycemic control: Lantus 38 units QHS, Novolog 0-9 units TID with meals, Novolog 0-5 units QHS  Inpatient Diabetes Program Recommendations: Insulin - Meal Coverage:If post prandial glucose is consistently greater than 180 mg/dl, please consider ordering Novolog 4 units TID with meals for meal coverage if patient eats at least 50% of meals.  Thanks, Barnie Alderman, RN, MSN, CDE Diabetes Coordinator Inpatient Diabetes Program [phone] (Team Pager from 8am to 5pm)

## 2016-11-16 NOTE — Progress Notes (Signed)
Unable to connect with the educational videos, so I  Googled coumadin educational video on patient's phone and allowed her to watch there, patient verbalized understanding by explaining what coumadin is and what things to avoid while taking the medication,and also what things to do while taking the medication.

## 2016-11-16 NOTE — Progress Notes (Signed)
Progress Note  Patient Name: [name] Date of Encounter: 11/16/2016  Primary Cardiologist: Dr. Bronson Ing    Subjective   Feels "about the same as yesterday". Denies chest pain, shortness of breath, and palpitations.  Inpatient Medications    Scheduled Meds: . cefUROXime  500 mg Oral Q breakfast  . ferrous sulfate  325 mg Oral Q breakfast  . fluticasone  1 spray Each Nare Daily  . furosemide  80 mg Intravenous Q8H  . hydrALAZINE  100 mg Oral TID  . insulin aspart  0-5 Units Subcutaneous QHS  . insulin aspart  0-9 Units Subcutaneous TID WC  . insulin glargine  38 Units Subcutaneous QHS  . isosorbide mononitrate  60 mg Oral Daily  . latanoprost  1 drop Both Eyes QHS  . meclizine  12.5 mg Oral BID  . metoprolol succinate  75 mg Oral Daily  . multivitamin with minerals  1 tablet Oral Daily  . pantoprazole  40 mg Oral Daily  . polyethylene glycol  17 g Oral BID  . potassium chloride  40 mEq Oral BID  . rosuvastatin  40 mg Oral QHS  . senna-docusate  1 tablet Oral BID  . ticagrelor  60 mg Oral BID  . warfarin  7.5 mg Oral Once  . warfarin   Does not apply Once  . Warfarin - Pharmacist Dosing Inpatient   Does not apply Q24H   Continuous Infusions:  PRN Meds: acetaminophen, bisacodyl, bisacodyl, diazepam, HYDROcodone-homatropine, loratadine, menthol-cetylpyridinium, nitroGLYCERIN   Vital Signs    Vitals:   11/15/16 1300 11/15/16 2024 11/16/16 0526 11/16/16 0656  BP: 133/61 139/64 130/64   Pulse: 90 83 79   Resp: 17 17 17    Temp: 98.3 F (36.8 C) 100 F (37.8 C) 98.4 F (36.9 C)   TempSrc: Oral Oral Oral   SpO2: 100% 100% 100%   Weight:    208 lb 6.4 oz (94.5 kg)  Height:        Intake/Output Summary (Last 24 hours) at 11/16/16 0912 Last data filed at 11/16/16 0527  Gross per 24 hour  Intake              720 ml  Output             2901 ml  Net            -2181 ml   Filed Weights   11/14/16 0500 11/15/16 0514 11/16/16 0656  Weight: 207 lb 1.6 oz  (93.9 kg) 209 lb 3.2 oz (94.9 kg) 208 lb 6.4 oz (94.5 kg)    Telemetry    NSR, PVC's, atrial run - Personally Reviewed  ECG    n/a - Personally Reviewed  Physical Exam   GEN: No acute distress.   Neck: No JVD Cardiac: RRR, no murmurs, rubs, or gallops.  Respiratory: Clear to auscultation bilaterally. GI: Soft, nontender, non-distended  MS: No edema; No deformity. Neuro:  Nonfocal  Psych: Normal affect   Labs    Chemistry Recent Labs Lab 11/12/16 0704 11/13/16 0736 11/14/16 0409 11/15/16 0609 11/16/16 0613  NA 133* 137 134* 134* 134*  K 4.1 4.4 4.2 3.3* 3.9  CL 96* 98* 97* 96* 98*  CO2 24 25 25 24 24   GLUCOSE 173* 118* 176* 127* 182*  BUN 67* 71* 71* 65* 62*  CREATININE 2.79* 2.90* 2.95* 2.87* 2.81*  CALCIUM 9.1 9.6 9.3 9.1 9.1  PROT 7.6 7.7  --  7.6  --   ALBUMIN 3.4* 3.4*  --  3.2*  --   AST 66* 64*  --  51*  --   ALT 59* 61*  --  54  --   ALKPHOS 157* 179*  --  169*  --   BILITOT 0.7 0.6  --  0.6  --   GFRNONAA 16* 15* 15* 15* 16*  GFRAA 19* 18* 17* 18* 18*  ANIONGAP 13 14 12 14 12      Hematology Recent Labs Lab 11/13/16 0736 11/14/16 0409 11/16/16 0613  WBC 8.0 6.5 8.1  RBC 2.80* 2.75* 2.88*  HGB 8.9* 8.9* 8.8*  HCT 27.0* 26.5* 27.7*  MCV 96.4 96.4 96.2  MCH 31.8 32.4 30.6  MCHC 33.0 33.6 31.8  RDW 16.4* 16.6* 16.8*  PLT 360 344 354    Cardiac EnzymesNo results for input(s): TROPONINI in the last 168 hours. No results for input(s): TROPIPOC in the last 168 hours.   BNPNo results for input(s): BNP, PROBNP in the last 168 hours.   DDimer No results for input(s): DDIMER in the last 168 hours.   Radiology    Nm Myocar Multi W/spect W/wall Motion / Ef  Result Date: 11/15/2016  There was no ST segment deviation noted during stress. T wave inversions inferiorly.  Defect 1: There is a large defect of severe severity present in the basal inferolateral, basal anterolateral, mid anterior, mid anteroseptal, mid inferolateral, mid anterolateral,  apical anterior, apical inferior, apical lateral and apex location.  This is a high risk study.  Findings consistent with prior myocardial infarction. No ischemic territories.  Nuclear stress EF: 21%.     Cardiac Studies   Echocardiogram 11/11/2016 Left ventricle: The cavity size was normal. Wall thickness was increased in a pattern of moderate LVH. Systolic function was moderately to severely reduced. The estimated ejection fraction was in the range of 30% to 35%. Features are consistent with a pseudonormal left ventricular filling pattern, with concomitant abnormal relaxation and increased filling pressure (grade 2 diastolic dysfunction). Doppler parameters are consistent with high ventricular filling pressure. - Regional wall motion abnormality: Hypokinesis of the mid inferior and basal-mid inferolateral myocardium. - Aortic valve: Mildly calcified annulus. Trileaflet; mildly thickened leaflets. Valve area (VTI): 1.57 cm^2. Valve area (Vmax): 1.57 cm^2. Valve area (Vmean): 1.51 cm^2. - Mitral valve: Mildly calcified annulus. Mildly thickened leaflets . There was mild regurgitation. - Left atrium: The atrium was severely dilated. - Right atrium: The atrium was moderately dilated. - Pulmonary arteries: Systolic pressure was moderately increased. PA peak pressure: 44 mm Hg (S).   Nuclear stress test (11/15/16):   There was no ST segment deviation noted during stress. T wave inversions inferiorly.  Defect 1: There is a large defect of severe severity present in the basal inferolateral, basal anterolateral, mid anterior, mid anteroseptal, mid inferolateral, mid anterolateral, apical anterior, apical inferior, apical lateral and apex location.  This is a high risk study.  Findings consistent with prior myocardial infarction. No ischemic territories.  Nuclear stress EF: 21%.  Patient Profile     71 y.o. 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   Assessment & Plan    1. Acute on chronic systolic heart failure: Symptomatically stable. Over 3 L output on IV Lasix 80 mg  TID. Continue Toprol-XL. BUN/creatinine declining. Can switch to torsemide 60 mg BID. I can see her in the clinic next week with a BMET beforehand. She may need metolazone as well going forward.  2. CAD: Symptomatically stable with nuclear stress test demonstrative of a large area of scar with no ischemia. I will arrange for outpatient EP evaluation for a defibrillator (AICD). I spoke at length to both her and her daughter about this. Continue Brilinta, Toprol-XL, Crestor, and nitrates for CAD.  3. HTN: Controlled with addition of hydralazine. No changes.  4. Atrial fibrillation: Still having paroxysms. Continue beta blocker and warfarin. INR subtherapeutic, 1.48.  5. CKD stage IV: BUN/creat remain in 2 range. Follow up BMET next week before I see her in clinic.     For questions or updates, please contact [place] Please consult [email or web address] for contact info under Cardiology/STEMI.      Signed, Kate Sable, MD  11/16/2016, 9:12 AM

## 2016-11-16 NOTE — Evaluation (Addendum)
Physical Therapy Evaluation Patient Details Name: [name] MRN: [id number] DOB: 1945-03-17 Today's Date: 11/16/2016   History of Present Illness  [name] is a 71 y.o. female with medical history significant of diastolic CHF, CKD baseline cr around 2, IDDM, CAD, anxiety comes in tonight after having an episode in the laundry mat tonight after receiving a phone call from [facility] about them discharging her husband.  Apparently her [facility] is hospitalized [facility] with a heart attack and acute cholecystitis (sounds like being medically managed) and they called her today to tell her they were sending him home with an IV and a foley cath.  She absolutely started to panic and said there was no way she could take care of him at home like that.  After she got off the phone she was very anxious and she says she had a panic attack.  She got really faint and dizzy.  She was with her daughter who called 911.  Her sugar was 70.  She denies any cp or sob.  She feels back to normal now.  In the ED she was found to have new onset afib with RVR with rate in the 120s.  She was on a dilt drip and has already converted to NSR with rate in the 60s.  Her sugar has normalized with drinking juice.  She has not been eating well due to stress about her husband being in the hospital, he is now going to rehab.  She was recently d/ced from here over a week ago for HCAp, actually had a thoracentesis and has been doing well at home since her discharge.  Denies any swelling, fever or cough.    Clinical Impression  Patient limited for functional mobility as stated below secondary to BLE weakness, fatigue and poor standing balance.  Patient will benefit from continued physical therapy in hospital and recommended venue below to increase strength, balance, endurance for safe ADLs and gait.     Follow Up Recommendations Home health PT;Supervision - Intermittent    Equipment Recommendations  None  recommended by PT    Recommendations for Other Services       Precautions / Restrictions Precautions Precautions: Fall Restrictions Weight Bearing Restrictions: No      Mobility  Bed Mobility Overal bed mobility: Modified Independent             General bed mobility comments: uses siderail to sit up, pulls self up using bed mattress at home  Transfers Overall transfer level: Needs assistance Equipment used: Rolling walker (2 wheeled) Transfers: Sit to/from Stand Sit to Stand: Supervision            Ambulation/Gait Ambulation/Gait assistance: Supervision Ambulation Distance (Feet): 35 Feet Assistive device: Rolling walker (2 wheeled) Gait Pattern/deviations: Decreased step length - left;Decreased step length - right;Decreased stride length   Gait velocity interpretation: Below normal speed for age/gender General Gait Details: Demonstrates slightly labored slow cadence without loss of balance, limited secondary to c/o fatigue  Stairs            Wheelchair Mobility    Modified Rankin (Stroke Patients Only)       Balance Overall balance assessment: Needs assistance Sitting-balance support: No upper extremity supported;Feet supported Sitting balance-[name]: Good     Standing balance support: No upper extremity supported;During functional activity Standing balance-[name]: Fair  Pertinent Vitals/Pain Pain Assessment: 0-10 Pain Score: 5  Pain Location: neck and left shoulder Pain Descriptors / Indicators: Aching Pain Intervention(s): Limited activity within patient's tolerance;Monitored during session    [place] expects to be discharged to:: Private residence Living Arrangements: Spouse/significant other;Children Available Help at Discharge: Family Type of Home: House Home Access: Level entry   Entrance Stairs-Number of Steps: no steps into house, but has 12 steps into  basement with bilateral rails, occasionally has to go into basement Home Layout: Two level Home Equipment: Bedside commode;Walker - 2 wheels      Prior Function Level of Independence: Independent               Hand Dominance        Extremity/Trunk Assessment   Upper Extremity Assessment Upper Extremity Assessment: Overall WFL for tasks assessed    Lower Extremity Assessment Lower Extremity Assessment: Overall WFL for tasks assessed    Cervical / Trunk Assessment Cervical / Trunk Assessment: Normal  Communication   Communication: No difficulties  Cognition Arousal/Alertness: Awake/alert Behavior During Therapy: WFL for tasks assessed/performed Overall Cognitive Status: Within Functional Limits for tasks assessed                                        General Comments      Exercises     Assessment/Plan    PT Assessment Patient needs continued PT services  PT Problem List Decreased strength;Decreased activity tolerance;Decreased mobility;Decreased balance       PT Treatment Interventions Gait training;Stair training;Functional mobility training;Therapeutic activities;Therapeutic exercise;Patient/family education    PT Goals (Current goals can be found in the Care Plan section)  Acute Rehab PT Goals Patient Stated Goal: Return home with assistance PT Goal Formulation: With patient Time For Goal Achievement: 11/18/16 Potential to Achieve Goals: Good    Frequency Min 3X/week   Barriers to discharge        Co-evaluation               AM-PAC PT "6 Clicks" Daily Activity  Outcome Measure Difficulty turning over in bed (including adjusting bedclothes, sheets and blankets)?: None Difficulty moving from lying on back to sitting on the side of the bed? : None Difficulty sitting down on and standing up from a chair with arms (e.g., wheelchair, bedside commode, etc,.)?: None Help needed moving to and from a bed to chair (including a  wheelchair)?: A Little Help needed walking in hospital room?: A Little Help needed climbing 3-5 steps with a railing? : A Little 6 Click Score: 21    End of Session Equipment Utilized During Treatment: Gait belt Activity Tolerance: Patient limited by fatigue Patient left: in bed (seated at bedside) Nurse Communication: Mobility status PT Visit Diagnosis: Unsteadiness on feet (R26.81);Other abnormalities of gait and mobility (R26.89);Muscle weakness (generalized) (M62.81)    Time: 9326-7124 30 min (calculated)     Charges:   PT Evaluation $PT Eval Low Complexity: 1 Low PT Treatments $Therapeutic Activity: 23-37 mins   PT G Codes:        11:59 AM, 11-22-16 Lonell Grandchild, MPT Physical Therapist with [facility] [phone] office [phone] mobile phone

## 2016-11-16 NOTE — Care Management Note (Signed)
Case Management Note  Patient Details  Name: NYKOLE MATOS MRN: [id number] Date of Birth: 1945-12-15  Expected Discharge Date:  11/16/16               Expected Discharge Plan:  Home/Self Care  Discharge planning Services  CM Consult, Medication Assistance  Post Acute Care Choice:  NA Choice offered to:  NA  Status of Service:  Completed, signed off  Additional Comments: DC today on coumadin. Has f/u in coumadin clinic on Friday. Pt voices concern about not having daughter at home until tomorrow evening (she is out of town). "I'm going to have to take my bath by myself". CM explained that in order to remain in the hospital there must be medical necessity. Pt worked with PT, ambulated 19ft independently. Pt offered HH, pt declines.   Sherald Barge, RN 11/16/2016, 11:09 AM

## 2016-11-16 NOTE — Discharge Summary (Signed)
Physician Discharge Summary  [name] MRN: [id number] DOB/AGE: 1945-07-18 71 y.o.  PCP: [name], MD   Admit date: 11/10/2016 Discharge date: 11/16/2016  Discharge Diagnoses:    Principal Problem:   Atrial fibrillation, new onset [facility]) Active Problems:   Anxiety state   AKI (acute kidney injury) ([place])   CKD (chronic kidney disease), stage IV ([place])   DM type 2 causing vascular disease ([place])   Hypoglycemia   Anemia in chronic kidney disease   Atrial fibrillation (HCC)   Abnormal nuclear stress test   Left ventricular dysfunction    Follow-up recommendations Follow-up with PCP in 3-5 days , including all  additional recommended appointments as below Follow-up CBC, CMP in 3-5 days Patient is being discharged with home health RN for INR monitoring with results to PCP and cardiology Patient to follow-up with cardiology early next week for BMP check      Current Discharge Medication List    START taking these medications   Details  cefUROXime (CEFTIN) 500 MG tablet Take 1 tablet (500 mg total) by mouth daily with breakfast. Qty: 5 tablet, Refills: 0    warfarin (COUMADIN) 7.5 MG tablet Take 1 tablet (7.5 mg total) by mouth once. Qty: 45 tablet, Refills: 2      CONTINUE these medications which have CHANGED   Details  hydrALAZINE (APRESOLINE) 100 MG tablet Take 1 tablet (100 mg total) by mouth 3 (three) times daily. Qty: 120 tablet, Refills: 1    metoprolol succinate (TOPROL-XL) 25 MG 24 hr tablet Take 3 tablets (75 mg total) by mouth daily. Take with or immediately following a meal. Qty: 30 tablet, Refills: 2    ticagrelor (BRILINTA) 60 MG TABS tablet Take 1 tablet (60 mg total) by mouth 2 (two) times daily. Qty: 60 tablet, Refills: 1      CONTINUE these medications which have NOT CHANGED   Details  acetaminophen (TYLENOL) 500 MG tablet Take 2 tablets (1,000 mg total) by mouth every 6 (six) hours as needed (pain). Qty: 30 tablet, Refills: 0     albuterol (PROVENTIL HFA;VENTOLIN HFA) 108 (90 Base) MCG/ACT inhaler Inhale 1-2 puffs into the lungs every 6 (six) hours as needed for wheezing or shortness of breath. Qty: 1 Inhaler, Refills: 0    albuterol (PROVENTIL) (2.5 MG/3ML) 0.083% nebulizer solution Take 3 mLs (2.5 mg total) by nebulization every 6 (six) hours as needed for wheezing or shortness of breath. Qty: 75 mL, Refills: 1    Continuous Blood Gluc Sensor (FREESTYLE LIBRE SENSOR SYSTEM) MISC Use one sensor every 10 days for continuous glucose monitoring. Qty: 3 each, Refills: 3    cyclobenzaprine (FLEXERIL) 5 MG tablet Take 5 mg by mouth at bedtime. Refills: 0    diazepam (VALIUM) 5 MG tablet Take 1 tablet (5 mg total) by mouth every 12 (twelve) hours as needed for muscle spasms. Qty: 10 tablet, Refills: 0    ferrous sulfate 325 (65 FE) MG tablet Take 325 mg by mouth daily with breakfast.     fluticasone (FLONASE) 50 MCG/ACT nasal spray Place 1 spray into both nostrils daily.    HYDROcodone-homatropine (HYCODAN) 5-1.5 MG/5ML syrup Take 5 mLs by mouth every 6 (six) hours as needed for cough.  Refills: 0    insulin degludec (TRESIBA) 100 UNIT/ML SOPN FlexTouch Pen Inject 40 Units into the skin at bedtime.     insulin lispro (HUMALOG KWIKPEN) 100 UNIT/ML KiwkPen INJECT 18 TO 24 UNITS TOTAL INTO THE SKIN THREE TIMES DAILY Qty: 15 mL,  Refills: 2    isosorbide mononitrate (IMDUR) 60 MG 24 hr tablet Take 1 tablet (60 mg total) by mouth daily. Qty: 30 tablet, Refills: 0    latanoprost (XALATAN) 0.005 % ophthalmic solution Place 1 drop into both eyes at bedtime.     meclizine (ANTIVERT) 25 MG tablet Take 12.5 mg by mouth 2 (two) times daily.     Multiple Vitamin (MULTIVITAMIN WITH MINERALS) TABS tablet Take 1 tablet by mouth daily.    nitroGLYCERIN (NITROSTAT) 0.4 MG SL tablet Place 1 tablet (0.4 mg total) under the tongue every 5 (five) minutes as needed for chest pain. Qty: 25 tablet, Refills: 2    pantoprazole  (PROTONIX) 40 MG tablet Take 40 mg by mouth daily.     potassium chloride SA (K-DUR,KLOR-CON) 20 MEQ tablet Take 2 tablets (40 mEq total) by mouth daily. Qty: 60 tablet, Refills: 5    RANEXA 500 MG 12 hr tablet TAKE ONE TABLET BY MOUTH TWICE DAILY Qty: 60 tablet, Refills: 5    rosuvastatin (CRESTOR) 40 MG tablet Take 1 tablet (40 mg total) by mouth at bedtime. Qty: 30 tablet, Refills: 0    torsemide (DEMADEX) 20 MG tablet Take 60 mg by mouth 2 (two) times daily. Pt is able to take an additional tablet daily for weight gain greater than 3lbs.    trolamine salicylate (ASPERCREME) 10 % cream Apply 1 application topically as needed for muscle pain.      STOP taking these medications     aspirin EC 81 MG tablet          Discharge Condition:  Stable per cardiology  Discharge Instructions Get Medicines reviewed and adjusted: Please take all your medications with you for your next visit with your Primary MD  Please request your Primary MD to go over all hospital tests and procedure/radiological results at the follow up, please ask your Primary MD to get all Hospital records sent to his/her office.  If you experience worsening of your admission symptoms, develop shortness of breath, life threatening emergency, suicidal or homicidal thoughts you must seek medical attention immediately by calling 911 or calling your MD immediately if symptoms less severe.  You must read complete instructions/literature along with all the possible adverse reactions/side effects for all the Medicines you take and that have been prescribed to you. Take any new Medicines after you have completely understood and accpet all the possible adverse reactions/side effects.   Do not drive when taking Pain medications.   Do not take more than prescribed Pain, Sleep and Anxiety Medications  Special Instructions: If you have smoked or chewed Tobacco in the last 2 yrs please stop smoking, stop any regular Alcohol  and or any Recreational drug use.  Wear Seat belts while driving.  Please note  You were cared for by a hospitalist during your hospital stay. Once you are discharged, your primary care physician will handle any further medical issues. Please note that NO REFILLS for any discharge medications will be authorized once you are discharged, as it is imperative that you return to your primary care physician (or establish a relationship with a primary care physician if you do not have one) for your aftercare needs so that they can reassess your need for medications and monitor your lab values.  Discharge Instructions    Diet - low sodium heart healthy    Complete by:  As directed    Increase activity slowly    Complete by:  As directed  Allergies  Allergen Reactions  . Ace Inhibitors Cough  . Amlodipine     Edema   . Codeine Rash      Disposition:  Home with home health   Consults:  Cardiology    Significant Diagnostic Studies:  Dg Chest 1 View  Result Date: 10/31/2016 CLINICAL DATA:  Status post right thoracentesis. EXAM: CHEST 1 VIEW COMPARISON:  10/31/2016. FINDINGS: Stable enlarged cardiac silhouette. Minimal left lower lobe atelectasis with improvement. Minimal bilateral pleural fluid, mildly improved on the left and significantly improved on the right following thoracentesis. No pneumothorax. Thoracic spine degenerative changes. IMPRESSION: 1. Almost completely resolved right pleural fluid following thoracentesis without pneumothorax. 2. Small left pleural effusion with improvement with decreased left basilar atelectasis. Electronically Signed   By: Claudie Revering M.D.   On: 10/31/2016 13:30   Dg Chest 2 View  Result Date: 10/31/2016 CLINICAL DATA:  Chest pain and shortness of breath.  Cough. EXAM: CHEST  2 VIEW COMPARISON:  08/07/2016 FINDINGS: There is cardiomegaly. Bilateral pleural effusions, moderate on the right and small on the left. There is vascular congestion  and peribronchial cuffing suggesting pulmonary edema. No pneumothorax. IMPRESSION: Cardiomegaly with bilateral pleural effusions and vascular congestion. Peribronchial cuffing may reflect pulmonary edema or bronchial inflammation. Electronically Signed   By: Jeb Levering M.D.   On: 10/31/2016 06:11   Ct Soft Tissue Neck Wo Contrast  Result Date: 10/29/2016 CLINICAL DATA:  Left-sided neck pain after doing exercises in cardiac rehab last week. EXAM: CT NECK WITHOUT CONTRAST TECHNIQUE: Multidetector CT imaging of the neck was performed following the standard protocol without intravenous contrast. COMPARISON:  None. FINDINGS: Pharynx and larynx: No noted mass or inflammation. Limited at the level of the larynx due to patient motion. Salivary glands: No inflammation, mass, or stone. Thyroid: Normal. Lymph nodes: No enlargement or abnormal density of cervical lymph nodes. Vascular: Limited without contrast. Moderate calcified plaque at the carotid bifurcations. Limited intracranial: Negative Visualized orbits: Partly seen. No acute finding. Right cataract resection. Mastoids and visualized paranasal sinuses: Lobulated soft tissue in the left maxillary could be polyp or lobulated retention cyst. No destructive changes in the neighboring bone. Skeleton: Negative for acute or destructive finding. There are calcified disc bulges from C2-3 to C6-7, with ligamentum flavum thickening and calcification at C4-5. These cause cord deformity at C3-4 and C4-5. Patient has undergone laminectomy at C5-6. Upper chest: Left peritracheal soft tissue is likely multiple borderline enlarged lymph nodes rather than 1 large node or nodal conglomerate. These could be congestive given layering pleural effusions, small where seen. IMPRESSION: 1. Limited by patient motion and lack of IV contrast. No specific explanation for acute neck pain. 2. Advanced cervical spine degeneration with calcified disc bulges and ligamentum flavum thickening.  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 3. Layering pleural effusions, small where seen. 4. Polypoid soft tissue in the left maxillary sinus, nonspecific without contrast. Electronically Signed   By: Monte Fantasia M.D.   On: 10/29/2016 18:39   Ct Chest Wo Contrast  Result Date: 10/31/2016 CLINICAL DATA:  Shortness of breath with exertion EXAM: CT CHEST WITHOUT CONTRAST TECHNIQUE: Multidetector CT imaging of the chest was performed following the standard protocol without IV contrast. COMPARISON:  Chest x-ray 10/31/2016 FINDINGS: Cardiovascular: Aortic and coronary artery calcifications. No aneurysm. Mild cardiomegaly. Mediastinum/Nodes: Scattered borderline and mildly enlarged mediastinal lymph nodes. Index AP window lymph node has a short axis diameter of 18 mm. Index right paratracheal  node has a short axis diameter of 9 mm. No axillary adenopathy. Lungs/Pleura: Large right pleural effusion and small left pleural effusion. Bibasilar atelectasis. Mild vascular congestion. Upper Abdomen: Imaging into the upper abdomen shows no acute findings. Musculoskeletal: Chest wall soft tissues are unremarkable. No acute bony abnormality. IMPRESSION: Large right pleural effusion and small left effusion. Bibasilar atelectasis. Vascular congestion. Extensive coronary artery calcifications. Mildly enlarged mediastinal lymph nodes, likely reactive. Aortic Atherosclerosis (ICD10-I70.0). Electronically Signed   By: Rolm Baptise M.D.   On: 10/31/2016 07:08   Nm Myocar Multi W/spect W/wall Motion / Ef  Result Date: 11/15/2016  There was no ST segment deviation noted during stress. T wave inversions inferiorly.  Defect 1: There is a large defect of severe severity present in the basal inferolateral, basal anterolateral, mid anterior, mid anteroseptal, mid inferolateral, mid anterolateral, apical anterior, apical inferior, apical lateral and apex location.  This is a high  risk study.  Findings consistent with prior myocardial infarction. No ischemic territories.  Nuclear stress EF: 21%.    Dg Chest Port 1 View  Result Date: 11/10/2016 CLINICAL DATA:  Shortness of breath tonight EXAM: PORTABLE CHEST 1 VIEW COMPARISON:  October 31, 2016 FINDINGS: The mediastinal contour is normal. The heart size is mildly enlarged. There is a small right pleural effusion with patchy consolidation of right lung base. The left lung is clear. No acute abnormalities identified in the visualized bones. IMPRESSION: Small right pleural effusion with patchy consolidation of right lung base, underline pneumonia is not excluded. Electronically Signed   By: Abelardo Diesel M.D.   On: 11/10/2016 21:28   US Thoracentesis Asp Pleural Space W/img Guide  Result Date: 10/31/2016 INDICATION: Right pleural effusion. EXAM: ULTRASOUND GUIDED RIGHT THORACENTESIS MEDICATIONS: None. COMPLICATIONS: None immediate. PROCEDURE: An ultrasound guided thoracentesis was thoroughly discussed with the patient and questions answered. The benefits, risks, alternatives and complications were also discussed. The patient understands and wishes to proceed with the procedure. Written consent was obtained. Ultrasound was performed to localize and mark an adequate pocket of fluid in the right chest. The area was then prepped and draped in the normal sterile fashion. 1% Lidocaine was used for local anesthesia. Under ultrasound guidance a Yuen catheter was introduced. Thoracentesis was performed. The catheter was removed and a dressing applied. FINDINGS: A total of approximately 1 L of blood-tinged fluid was removed. Samples were sent to the laboratory as requested by the clinical team. IMPRESSION: Successful ultrasound guided right thoracentesis yielding 1 L of pleural fluid. The patient tolerated the procedure well. No immediate complications. Electronically Signed   By: Lorriane Shire M.D.   On: 10/31/2016 13:09     LV EF: 30% -    35%  ------------------------------------------------------------------- Indications:      Abnormal EKG 794.31.  ------------------------------------------------------------------- History:   PMH:  Acquired from the patient and from the patient&'s chart.  Murmur.  Atrial fibrillation.  PMH:  NSTEMI (non-ST elevated myocardial infarction) GERD. Bilateral lower extremity edema  Risk factors:  Hypertension. Diabetes mellitus. Dyslipidemia.  ------------------------------------------------------------------- Study Conclusions  - Left ventricle: The cavity size was normal. Wall thickness was   increased in a pattern of moderate LVH. Systolic function was   moderately to severely reduced. The estimated ejection fraction   was in the range of 30% to 35%. Features are consistent with a   pseudonormal left ventricular filling pattern, with concomitant   abnormal relaxation and increased filling pressure (grade 2   diastolic dysfunction). Doppler parameters are consistent with  high ventricular filling pressure. - Regional wall motion abnormality: Hypokinesis of the mid inferior   and basal-mid inferolateral myocardium. - Aortic valve: Mildly calcified annulus. Trileaflet; mildly   thickened leaflets. Valve area (VTI): 1.57 cm^2. Valve area   (Vmax): 1.57 cm^2. Valve area (Vmean): 1.51 cm^2. - Mitral valve: Mildly calcified annulus. Mildly thickened leaflets   . There was mild regurgitation. - Left atrium: The atrium was severely dilated. - Right atrium: The atrium was moderately dilated. - Pulmonary arteries: Systolic pressure was moderately increased.   PA peak pressure: 44 mm Hg (S).    Filed Weights   11/14/16 0500 11/15/16 0514 11/16/16 0656  Weight: 93.9 kg (207 lb 1.6 oz) 94.9 kg (209 lb 3.2 oz) 94.5 kg (208 lb 6.4 oz)     Microbiology: Recent Results (from the past 240 hour(s))  Urine culture     Status: Abnormal   Collection Time: 11/10/16  8:55 PM  Result Value  Ref Range Status   Specimen Description URINE, CLEAN CATCH  Final   Special Requests NONE  Final   Culture 50,000 COLONIES/mL ESCHERICHIA COLI (A)  Final   Report Status 11/13/2016 FINAL  Final   Organism ID, Bacteria ESCHERICHIA COLI (A)  Final      Susceptibility   Escherichia coli - MIC*    AMPICILLIN >=32 RESISTANT Resistant     CEFAZOLIN <=4 SENSITIVE Sensitive     CEFTRIAXONE <=1 SENSITIVE Sensitive     CIPROFLOXACIN >=4 RESISTANT Resistant     GENTAMICIN <=1 SENSITIVE Sensitive     IMIPENEM <=0.25 SENSITIVE Sensitive     NITROFURANTOIN <=16 SENSITIVE Sensitive     TRIMETH/SULFA >=320 RESISTANT Resistant     AMPICILLIN/SULBACTAM >=32 RESISTANT Resistant     PIP/TAZO 64 INTERMEDIATE Intermediate     Extended ESBL NEGATIVE Sensitive     * 50,000 COLONIES/mL ESCHERICHIA COLI  MRSA PCR Screening     Status: None   Collection Time: 11/11/16  8:02 AM  Result Value Ref Range Status   MRSA by PCR NEGATIVE NEGATIVE Final    Comment:        The GeneXpert MRSA Assay (FDA approved for NASAL specimens only), is one component of a comprehensive MRSA colonization surveillance program. It is not intended to diagnose MRSA infection nor to guide or monitor treatment for MRSA infections.        Blood Culture    Component Value Date/Time   SDES URINE, CLEAN CATCH 11/10/2016 2055   SPECREQUEST NONE 11/10/2016 2055   CULT 50,000 COLONIES/mL ESCHERICHIA COLI (A) 11/10/2016 2055   REPTSTATUS 11/13/2016 FINAL 11/10/2016 2055      Labs: Results for orders placed or performed during the hospital encounter of 11/10/16 (from the past 48 hour(s))  Glucose, capillary     Status: Abnormal   Collection Time: 11/14/16 11:07 AM  Result Value Ref Range   Glucose-Capillary 284 (H) 65 - 99 mg/dL  Glucose, capillary     Status: Abnormal   Collection Time: 11/14/16  4:28 PM  Result Value Ref Range   Glucose-Capillary 247 (H) 65 - 99 mg/dL  Glucose, capillary     Status: Abnormal    Collection Time: 11/14/16  8:25 PM  Result Value Ref Range   Glucose-Capillary 312 (H) 65 - 99 mg/dL   Comment 1 Notify RN    Comment 2 Document in Chart   Protime-INR     Status: Abnormal   Collection Time: 11/15/16  6:02 AM  Result Value Ref Range   Prothrombin  Time 18.8 (H) 11.4 - 15.2 seconds   INR 1.59   Comprehensive metabolic panel     Status: Abnormal   Collection Time: 11/15/16  6:09 AM  Result Value Ref Range   Sodium 134 (L) 135 - 145 mmol/L   Potassium 3.3 (L) 3.5 - 5.1 mmol/L    Comment: DELTA CHECK NOTED   Chloride 96 (L) 101 - 111 mmol/L   CO2 24 22 - 32 mmol/L   Glucose, Bld 127 (H) 65 - 99 mg/dL   BUN 65 (H) 6 - 20 mg/dL   Creatinine, Ser 2.87 (H) 0.44 - 1.00 mg/dL   Calcium 9.1 8.9 - 10.3 mg/dL   Total Protein 7.6 6.5 - 8.1 g/dL   Albumin 3.2 (L) 3.5 - 5.0 g/dL   AST 51 (H) 15 - 41 U/L   ALT 54 14 - 54 U/L   Alkaline Phosphatase 169 (H) 38 - 126 U/L   Total Bilirubin 0.6 0.3 - 1.2 mg/dL   GFR calc non Af Amer 15 (L) >60 mL/min   GFR calc Af Amer 18 (L) >60 mL/min    Comment: (NOTE) The eGFR has been calculated using the CKD EPI equation. This calculation has not been validated in all clinical situations. eGFR's persistently <60 mL/min signify possible Chronic Kidney Disease.    Anion gap 14 5 - 15  Magnesium     Status: None   Collection Time: 11/15/16  6:09 AM  Result Value Ref Range   Magnesium 2.3 1.7 - 2.4 mg/dL  Glucose, capillary     Status: Abnormal   Collection Time: 11/15/16  7:27 AM  Result Value Ref Range   Glucose-Capillary 111 (H) 65 - 99 mg/dL  Glucose, capillary     Status: Abnormal   Collection Time: 11/15/16 11:57 AM  Result Value Ref Range   Glucose-Capillary 155 (H) 65 - 99 mg/dL  Glucose, capillary     Status: Abnormal   Collection Time: 11/15/16  4:38 PM  Result Value Ref Range   Glucose-Capillary 196 (H) 65 - 99 mg/dL  Glucose, capillary     Status: Abnormal   Collection Time: 11/15/16  8:45 PM  Result Value Ref Range    Glucose-Capillary 274 (H) 65 - 99 mg/dL   Comment 1 Notify RN    Comment 2 Document in Chart   Protime-INR     Status: Abnormal   Collection Time: 11/16/16  6:13 AM  Result Value Ref Range   Prothrombin Time 17.8 (H) 11.4 - 15.2 seconds   INR 0.92   Basic metabolic panel     Status: Abnormal   Collection Time: 11/16/16  6:13 AM  Result Value Ref Range   Sodium 134 (L) 135 - 145 mmol/L   Potassium 3.9 3.5 - 5.1 mmol/L   Chloride 98 (L) 101 - 111 mmol/L   CO2 24 22 - 32 mmol/L   Glucose, Bld 182 (H) 65 - 99 mg/dL   BUN 62 (H) 6 - 20 mg/dL   Creatinine, Ser 2.81 (H) 0.44 - 1.00 mg/dL   Calcium 9.1 8.9 - 10.3 mg/dL   GFR calc non Af Amer 16 (L) >60 mL/min   GFR calc Af Amer 18 (L) >60 mL/min    Comment: (NOTE) The eGFR has been calculated using the CKD EPI equation. This calculation has not been validated in all clinical situations. eGFR's persistently <60 mL/min signify possible Chronic Kidney Disease.    Anion gap 12 5 - 15  CBC  Status: Abnormal   Collection Time: 11/16/16  6:13 AM  Result Value Ref Range   WBC 8.1 4.0 - 10.5 K/uL   RBC 2.88 (L) 3.87 - 5.11 MIL/uL   Hemoglobin 8.8 (L) 12.0 - 15.0 g/dL   HCT 27.7 (L) 36.0 - 46.0 %   MCV 96.2 78.0 - 100.0 fL   MCH 30.6 26.0 - 34.0 pg   MCHC 31.8 30.0 - 36.0 g/dL   RDW 16.8 (H) 11.5 - 15.5 %   Platelets 354 150 - 400 K/uL  Glucose, capillary     Status: Abnormal   Collection Time: 11/16/16  7:33 AM  Result Value Ref Range   Glucose-Capillary 170 (H) 65 - 99 mg/dL   Comment 1 Notify RN    Comment 2 Document in Chart      Lipid Panel     Component Value Date/Time   CHOL 160 07/07/2015 0259   TRIG 119 07/07/2015 0259   HDL 40 (L) 07/07/2015 0259   CHOLHDL 4.0 07/07/2015 0259   VLDL 24 07/07/2015 0259   LDLCALC 96 07/07/2015 0259     Lab Results  Component Value Date   HGBA1C 8.8 (H) 10/31/2016   HGBA1C 9.6 09/28/2016   HGBA1C 10.3 (H) 07/09/2016     Lab Results  Component Value Date   LDLCALC 96  07/07/2015   CREATININE 2.81 (H) 11/16/2016     HPI   71 y.o. 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 Apparently the patient's husband has recently been seen at [facility] in the setting of an MI, was given a phone call to let her know that he was able to come home, apparently he was being discharged with IV access and a Foley catheterization, and she became upset and started to panic about this as she was unable to care for him, and was unprepared for him to return home that day. In her anxiety she began to have dizziness and chest pressure. Daughter who was with her, called EMS  On arrival to the emergency room heart rate was 106, with a blood pressure 107/59, O2 sat 99%, she was afebrile. EKG revealed atrial fibrillation, T wave inversion noted laterally, with early repolarization, heart rate 119 bpm. She apparently had been started on a diltiazem drip, and converted to normal sinus rhythm on dilitazem.  Subsequent EKG revealed normal sinus rhythm with intraventricular conduction delay and T-wave inversion  in time laterally.Marland Kitchen She was also found to have a UTI with Escherichia coli. Pertinent labs revealed Troponin 0.03, potassium of 3.6, glucose 215, creatinine 2.82. Hemoglobin was 8.8, hematocrit 27.5, there was no leukocytosis, no thrombocytopenia. CXR revealed small right pleural effusion, pneumonia could not be excluded.   HOSPITAL COURSE:    New onset atrial fibrillation with RVR, RVR has resolved Patient was evaluated by cardiology, now rate controlled on metoprolol Mg is  normal - TSH is normal - patient transition to normal sinus rhythm but went back into atrial fibrillation   CHA2DS2 VASc score is 5 - started on anticoagulation with Coumadin by cardiology , patient has also been on  aspirin and Brilinta,reduce Brilinta to 60 mg bid. cardiology discontinued ASA.  INR subtherapeutic. Home health RN arranged for monitoring    Chronic combined systolic and diastolic CHF - acute worsening  TTE this admit notes EF 30-35% w/ grade 2 DD - last TTE in May 2018 noted EF 45-50% grade 2 DD - baseline wgt appears to be 89-91kg - patient was diuresed with lasix 80 mg IV BID - Metoprolol was increased to 75 mg daily and taken off of CCB.  Also on hydralazine for more optimal BP control       Filed Weights   11/13/16 0441 11/14/16 0500 11/15/16 0514  Weight: 94.1 kg (207 lb 6.4 oz) 93.9 kg (207 lb 1.6 oz) 94.9 kg (209 lb 3.2 oz)  Can switch to torsemide 60 mg BID. Cardiology recommended follow-up in clinic next week with a BMET . She may need metolazone as well going forward    Acute kidney injury on chronic kidney disease stage II Baseline creatinine 2.0 - did not improve w/gentle volume expansion yesterday - perhaps she suffered a period of poor perfusion in setting of Afib w/ RVR - cr up but stable this admit , diurese as per cardiology  Last Labs    Recent Labs Lab 11/11/16 0530 11/12/16 0704 11/13/16 0736 11/14/16 0409 11/15/16 0609  CREATININE 2.62* 2.79* 2.90* 2.95* 2.87*      CAD w/ stents in mid RCA and distal RCA/PDA Last cardiac cath Sept 2017 - denies SSCP but EF has worsened - Ranexa stopped in setting of worsening renal fxn  Lexiscan Myoview stress test on 11/15/16. coronary angiography given her CKD stage IV and high likelihood for development of contrast-mediated nephropathy and need for dialysis  Subclavian artery occlusion S/p thromboembolectomy - continue aspirin and Brillinta  Anemia of chronic disease No evidence of acute blood  loss - likely due to chronic kidney disease - follow trend   E coli UTI Transition to oral abx to complete a Total of 7 day treatment  DM 2 Poorly controlled - A1c 8.8 10/31/16 - resume home regimen   Generalized anxiety disorder Appears well compensated at present      Discharge Exam:  Blood pressure 130/64, pulse 79, temperature 98.4 F (36.9 C), temperature source Oral, resp. rate 17, height _0  (1.727 m), weight 94.5 kg (208 lb 6.4 oz), SpO2 100 %. General: No acute respiratory distress - alert and oriented  Lungs: CTA B w/o wheezing or focal crackles  Cardiovascular: Regular rate and rhythm w/o M or rub  Abdomen: Nontender, nondistended, soft, bowel sounds positive, no rebound Extremities: 1+ B LE edema w/o cyanosis      Follow-up Information    Follow up On 11/22/2016.   Why:  at 10:30 am in [facility] information: [facility] [place]          Signed: Reyne Dumas 11/16/2016, 10:49 AM        Time spent >1 hour

## 2016-11-17 NOTE — Progress Notes (Addendum)
Patient's IV removed.  Site WNL.  AVS reviewed with patient and patient's family.  Verbalized understanding of discharge instructions, physician follow-up, medications, and Coumadin.  Patient given 1600 dose of medications and AVS updated - patient verbalized understanding.  Patient transported by NT via wheelchair to main entrance at discharge.  Patient stable at time of discharge.

## 2016-11-18 ENCOUNTER — Telehealth: Payer: Self-pay | Admitting: Cardiovascular Disease

## 2016-11-18 NOTE — Care Management (Signed)
CM received call from pt stating she now feels she needs HH. Pt has no preference of provider. CM has contacted [facility] (who can not see pt until late next week), [facility], [facility] and [facility] agencies who are not able to take referral. [facility] is able to take referral, pt info and order faxed to [facility]. They will be able to start care early next week.

## 2016-11-18 NOTE — Telephone Encounter (Signed)
Patient recently discharged from [facility] Daughter would like to know if she has any restrictions

## 2016-11-18 NOTE — Care Management (Signed)
Patient Information   Patient Name [name], [name] ([id number]) Sex Female DOB Apr 13, 1945  Room Bed  A321 A321-01  Patient Demographics   Address [place] [address] Phone [phone] [facility]) [phone] (Mobile) E-mail Address [email or web address]  Patient Ethnicity & Race   Ethnic Group Patient Race  Not Hispanic or Latino Black or African American  Emergency Contact(s)   Name Relation Home Work Mobile  Brock,Lawrence Spouse [phone]  [phone]  [place] D Daughter [phone]    Cordelia Pen [phone]    Documents on File    Status Date Received Description  Documents for the Patient  EMR Medication Summary Not Received    EMR Problem Summary Not Received    EMR Patient Summary Not Received    Historic Radiology Documentation Not Received    [place] Not Received    [place] E-Signature HIPAA Notice of Privacy Received 01/21/11   [place] E-Signature HIPAA Notice of Privacy Spanish Not Received    Driver's License Not Received    Insurance Card Not Received    Advance Directives/Living Will/HCPOA/POA Not Received    Editor, commissioning Not Received    Insurance Card Not Received    [place] Received 09/20/11 [facility]  Insurance Card Received 09/19/13 medicare,bcbs  Insurance Card Not Received    [place] HIPAA NOTICE OF PRIVACY - Scanned Not Received    Insurance Card Received 12/04/14 BCBS/MCR 2016  Insurance Card Received 08/03/12 bcbs/mcr  Release of Information Not Received    HIM ROI Authorization  02/21/13   HIM ROI Authorization (Expired) 09/23/13 Patient in office now. Need colonoscopy done in 2002.  Release of Information  09/26/13   HIM ROI Authorization  10/01/13   AMB Correspondence  03/04/14 REFERRAL NIDA MD, G  Insurance Card Received 03/12/14 BCBS  AMB Correspondence  03/04/14 DIAB EDU PATIENT LETTER [facility]   Other Photo ID Not Received    Advanced Beneficiary Notice (ABN) Not Received    E-Signature AOB Spanish Not Received    [place] E-Signature HIPAA Notice of Privacy Received 12/04/14   Insurance Card Received 12/12/14 [facility]  [place] HIPAA NOTICE OF PRIVACY - Scanned Received 12/12/14 [facility]  AMB Patient Logs/Info  04/07/15   AMB Correspondence  05/06/15 OFFICE NOTE [facility]  AMB Patient Logs/Info  81/82/99   [facility] Policy for Pain - E Signature     Insurance Card Received 07/27/15 medicare, bcbs 2017  AMB Correspondence  05/26/15 OFFICE NOTES [facility]  HIM ROI Authorization  08/17/15 pa pp  HIM ROI Authorization  09/28/15 [place]  AMB Correspondence  08/06/15 POC MADDY OTR CHT, L  AMB Outside Hospital Record  10/09/15 D/S [facility]  AMB Correspondence  08/18/15 OFFICE NOTE [facility]  AMB Patient Logs/Info  12/03/15 08/17-09/17  AMB Correspondence  09/03/15 OCCUPATIONAL THERAPY POC MADDY OTR, L  AMB Correspondence  09/03/15 POC MADDY OTR, L  HIM ROI Authorization  01/08/16 [place]  AMB Correspondence  09/18/15 OFFICE NOTES [facility]  AMB Patient Logs/Info  03/09/16   AMB Correspondence  02/12/16 OFFICE NOTE [facility]  AMB Correspondence  02/12/16 OFFICE NOTES [facility]  [place] E-Signature HIPAA Notice of Privacy Signed 04/01/16   AMB Patient Logs/Info  04/05/16   AMB Correspondence  04/13/16 OFFICE NOTE [facility]  AMB Correspondence  10/06/15 PROGRESS NOTE PT AND HAND  AMB  Correspondence  10/07/15 END OF CARE PT AND HAND  AMB Correspondence  06/13/16 [facility]  AMB Correspondence  06/13/16 OFFICE VISIT  AMB Correspondence  04/13/16 [facility]  AMB Correspondence  06/13/16 [facility]  HIM ROI Authorization  08/15/16 [facility]  Insurance Card Received 08/16/16 NEW MEDICARE 2018  HIM ROI Authorization  08/24/16 [place]  [facility]  HIM ROI Authorization  08/24/16 [place]  AMB Patient Logs/Info  08/30/16   HIM ROI Authorization  09/05/16 [facility] [facility]  AMB Correspondence  08/16/16 [facility]  AMB Patient Logs/Info  10/16/16   AMB Correspondence  10/17/16 [facility]  AMB Correspondence (Deleted) 09/29/15 POC Trula Slade MD, V  Documents for the Encounter  AOB (Assignment of Insurance Benefits) Not Received    E-signature AOB Signed 11/10/16   MEDICARE RIGHTS Not Received    E-signature Medicare Rights Signed 11/10/16   Cardiac Monitoring Strip Shift Summary  11/10/16   ED Patient Billing Extract   ED PB Summary  Cardiac Monitoring Strip  11/11/16   After Visit Summary   IP After Visit Summary  Patient Instructions  11/17/16   Discharge Instruction  11/17/16   EKG  11/11/16   Study Attachment for Report   CV Holter/Event Report - External  Admission Information   Attending Provider Admitting Provider Admission Type Admission Date/Time   Phillips Grout, MD Emergency 11/10/16 1846  Discharge Date Hospital Service Auth/Cert Status Service Area  11/16/16 Internal Medicine Incomplete [place]  Unit Room/Bed Admission Status   [facility]-DEPT 300 A321/A321-01 Discharged (Confirmed)   Admission   Complaint  weakness, shortness of breath  Hospital Account   Name Acct ID Class Status Primary Coverage  Ray[place], Azure [id number] Inpatient Discharged/Not Billed BLUE CROSS BLUE SHIELD - BCBS OTHER      Guarantor Account (for Hospital Account [id number])   Name Relation to Pt Service Area Active? Acct Type  Sharlot Gowda Self CHSA Yes Personal/Family  Address [place] [address] [phone])        Coverage Information (for Hospital Account [id number])   1. BLUE CROSS BLUE SHIELD/BCBS OTHER   F/O Payor/Plan Precert #  BLUE CROSS BLUE SHIELD/BCBS OTHER   Subscriber Subscriber #  Nona, Gracey  [id number]  Address Phone  [address] [place], [place] [address] [phone]  2. [place] PART A AND B   F/O Payor/Plan Precert #  MEDICARE/MEDICARE PART A AND B   Subscriber Subscriber #  Jazmon, Kos [id number]  Address Phone  [address] [place],  [address]

## 2016-11-18 NOTE — Telephone Encounter (Signed)
Daughter (Ms. Donnajean Lopes) notified that I did not see anything specific in regards to restrictions in discharge summary from [facility].  Advised to discuss this with provider as she has early hospital follow up scheduled for next Wednesday, 11/23/2016.  She verbalized understanding.

## 2016-11-18 NOTE — Care Management (Signed)
CM received call from [facility], they are unable to accept pt because [facility] order says INR q48hrs, this CM unsure of how pt planned to get INR checked without HH, [facility] can not provide RN to check INR. CM attempted contact with pt to ask about INR checks, pt did not answer, VM left. CM contacted [facility] again, they would be able to provide RN tomorrow for INR check, PT mid-late next week. CM faxed referral.

## 2016-11-19 ENCOUNTER — Emergency Department (HOSPITAL_COMMUNITY): Payer: BLUE CROSS/BLUE SHIELD

## 2016-11-19 ENCOUNTER — Inpatient Hospital Stay (HOSPITAL_COMMUNITY)
Admission: EM | Admit: 2016-11-19 | Discharge: 2016-11-27 | DRG: 308 | Disposition: A | Discharge status: Home Health | Payer: BLUE CROSS/BLUE SHIELD | Attending: Internal Medicine | Admitting: Internal Medicine

## 2016-11-19 ENCOUNTER — Encounter (HOSPITAL_COMMUNITY): Payer: Self-pay

## 2016-11-19 ENCOUNTER — Inpatient Hospital Stay (HOSPITAL_COMMUNITY): Payer: BLUE CROSS/BLUE SHIELD

## 2016-11-19 DIAGNOSIS — J90 Pleural effusion, not elsewhere classified: Secondary | ICD-10-CM | POA: Diagnosis present

## 2016-11-19 DIAGNOSIS — I255 Ischemic cardiomyopathy: Secondary | ICD-10-CM | POA: Diagnosis not present

## 2016-11-19 DIAGNOSIS — I2510 Atherosclerotic heart disease of native coronary artery without angina pectoris: Secondary | ICD-10-CM | POA: Diagnosis not present

## 2016-11-19 DIAGNOSIS — Q438 Other specified congenital malformations of intestine: Secondary | ICD-10-CM | POA: Diagnosis not present

## 2016-11-19 DIAGNOSIS — E11649 Type 2 diabetes mellitus with hypoglycemia without coma: Secondary | ICD-10-CM | POA: Diagnosis not present

## 2016-11-19 DIAGNOSIS — I4891 Unspecified atrial fibrillation: Secondary | ICD-10-CM | POA: Diagnosis not present

## 2016-11-19 DIAGNOSIS — I480 Paroxysmal atrial fibrillation: Secondary | ICD-10-CM | POA: Diagnosis not present

## 2016-11-19 DIAGNOSIS — E1121 Type 2 diabetes mellitus with diabetic nephropathy: Secondary | ICD-10-CM | POA: Diagnosis present

## 2016-11-19 DIAGNOSIS — I252 Old myocardial infarction: Secondary | ICD-10-CM | POA: Diagnosis not present

## 2016-11-19 DIAGNOSIS — Z794 Long term (current) use of insulin: Secondary | ICD-10-CM

## 2016-11-19 DIAGNOSIS — D631 Anemia in chronic kidney disease: Secondary | ICD-10-CM | POA: Diagnosis present

## 2016-11-19 DIAGNOSIS — I5043 Acute on chronic combined systolic (congestive) and diastolic (congestive) heart failure: Secondary | ICD-10-CM | POA: Diagnosis not present

## 2016-11-19 DIAGNOSIS — Z833 Family history of diabetes mellitus: Secondary | ICD-10-CM

## 2016-11-19 DIAGNOSIS — R531 Weakness: Secondary | ICD-10-CM | POA: Diagnosis not present

## 2016-11-19 DIAGNOSIS — T502X5A Adverse effect of carbonic-anhydrase inhibitors, benzothiadiazides and other diuretics, initial encounter: Secondary | ICD-10-CM | POA: Diagnosis not present

## 2016-11-19 DIAGNOSIS — I130 Hypertensive heart and chronic kidney disease with heart failure and stage 1 through stage 4 chronic kidney disease, or unspecified chronic kidney disease: Secondary | ICD-10-CM | POA: Diagnosis present

## 2016-11-19 DIAGNOSIS — I25119 Atherosclerotic heart disease of native coronary artery with unspecified angina pectoris: Secondary | ICD-10-CM | POA: Diagnosis not present

## 2016-11-19 DIAGNOSIS — Z86718 Personal history of other venous thrombosis and embolism: Secondary | ICD-10-CM | POA: Diagnosis not present

## 2016-11-19 DIAGNOSIS — E876 Hypokalemia: Secondary | ICD-10-CM | POA: Diagnosis not present

## 2016-11-19 DIAGNOSIS — F411 Generalized anxiety disorder: Secondary | ICD-10-CM | POA: Diagnosis present

## 2016-11-19 DIAGNOSIS — E860 Dehydration: Secondary | ICD-10-CM | POA: Diagnosis present

## 2016-11-19 DIAGNOSIS — Z9889 Other specified postprocedural states: Secondary | ICD-10-CM | POA: Diagnosis not present

## 2016-11-19 DIAGNOSIS — I10 Essential (primary) hypertension: Secondary | ICD-10-CM | POA: Diagnosis not present

## 2016-11-19 DIAGNOSIS — E1165 Type 2 diabetes mellitus with hyperglycemia: Secondary | ICD-10-CM | POA: Diagnosis not present

## 2016-11-19 DIAGNOSIS — Z79899 Other long term (current) drug therapy: Secondary | ICD-10-CM

## 2016-11-19 DIAGNOSIS — Z8249 Family history of ischemic heart disease and other diseases of the circulatory system: Secondary | ICD-10-CM

## 2016-11-19 DIAGNOSIS — E785 Hyperlipidemia, unspecified: Secondary | ICD-10-CM | POA: Diagnosis present

## 2016-11-19 DIAGNOSIS — Z7901 Long term (current) use of anticoagulants: Secondary | ICD-10-CM

## 2016-11-19 DIAGNOSIS — Z885 Allergy status to narcotic agent status: Secondary | ICD-10-CM

## 2016-11-19 DIAGNOSIS — I5023 Acute on chronic systolic (congestive) heart failure: Secondary | ICD-10-CM | POA: Diagnosis not present

## 2016-11-19 DIAGNOSIS — R0602 Shortness of breath: Secondary | ICD-10-CM

## 2016-11-19 DIAGNOSIS — Z888 Allergy status to other drugs, medicaments and biological substances status: Secondary | ICD-10-CM

## 2016-11-19 DIAGNOSIS — Z955 Presence of coronary angioplasty implant and graft: Secondary | ICD-10-CM

## 2016-11-19 DIAGNOSIS — K219 Gastro-esophageal reflux disease without esophagitis: Secondary | ICD-10-CM | POA: Diagnosis not present

## 2016-11-19 DIAGNOSIS — N184 Chronic kidney disease, stage 4 (severe): Secondary | ICD-10-CM | POA: Diagnosis present

## 2016-11-19 DIAGNOSIS — N179 Acute kidney failure, unspecified: Secondary | ICD-10-CM | POA: Diagnosis not present

## 2016-11-19 DIAGNOSIS — E1122 Type 2 diabetes mellitus with diabetic chronic kidney disease: Secondary | ICD-10-CM | POA: Diagnosis present

## 2016-11-19 HISTORY — DX: Type 2 diabetes mellitus without complications: E11.9

## 2016-11-19 HISTORY — DX: Essential (primary) hypertension: I10

## 2016-11-19 LAB — COMPREHENSIVE METABOLIC PANEL
ALT: 54 U/L (ref 14–54)
AST: 62 U/L — ABNORMAL HIGH (ref 15–41)
Albumin: 2.9 g/dL — ABNORMAL LOW (ref 3.5–5.0)
Alkaline Phosphatase: 164 U/L — ABNORMAL HIGH (ref 38–126)
Anion gap: 13 (ref 5–15)
BILIRUBIN TOTAL: 0.4 mg/dL (ref 0.3–1.2)
BUN: 67 mg/dL — ABNORMAL HIGH (ref 6–20)
CHLORIDE: 96 mmol/L — AB (ref 101–111)
CO2: 25 mmol/L (ref 22–32)
CREATININE: 3 mg/dL — AB (ref 0.44–1.00)
Calcium: 9 mg/dL (ref 8.9–10.3)
GFR, EST AFRICAN AMERICAN: 17 mL/min — AB (ref >60)
GFR, EST NON AFRICAN AMERICAN: 15 mL/min — AB (ref >60)
Glucose, Bld: 181 mg/dL — ABNORMAL HIGH (ref 65–99)
POTASSIUM: 4.3 mmol/L (ref 3.5–5.1)
Sodium: 134 mmol/L — ABNORMAL LOW (ref 135–145)
TOTAL PROTEIN: 7.6 g/dL (ref 6.5–8.1)

## 2016-11-19 LAB — GLUCOSE, CAPILLARY
Glucose-Capillary: 145 mg/dL — ABNORMAL HIGH (ref 65–99)
Glucose-Capillary: 139 mg/dL — ABNORMAL HIGH (ref 65–99)
Glucose-Capillary: 219 mg/dL — ABNORMAL HIGH (ref 65–99)

## 2016-11-19 LAB — CBC WITH DIFFERENTIAL/PLATELET
BASOS ABS: 0 10*3/uL (ref 0.0–0.1)
BASOS PCT: 0 %
EOS PCT: 0 %
Eosinophils Absolute: 0 10*3/uL (ref 0.0–0.7)
HCT: 25 % — ABNORMAL LOW (ref 36.0–46.0)
Hemoglobin: 8.1 g/dL — ABNORMAL LOW (ref 12.0–15.0)
Lymphocytes Relative: 5 %
Lymphs Abs: 0.4 10*3/uL — ABNORMAL LOW (ref 0.7–4.0)
MCH: 30.8 pg (ref 26.0–34.0)
MCHC: 32.4 g/dL (ref 30.0–36.0)
MCV: 95.1 fL (ref 78.0–100.0)
Monocytes Absolute: 0.7 10*3/uL (ref 0.1–1.0)
Monocytes Relative: 9 %
Neutro Abs: 6.5 10*3/uL (ref 1.7–7.7)
Neutrophils Relative %: 86 %
PLATELETS: 372 10*3/uL (ref 150–400)
RBC: 2.63 MIL/uL — AB (ref 3.87–5.11)
RDW: 16.9 % — AB (ref 11.5–15.5)
WBC: 7.6 10*3/uL (ref 4.0–10.5)

## 2016-11-19 LAB — I-STAT TROPONIN, ED: TROPONIN I, POC: 0.1 ng/mL — AB (ref 0.00–0.08)

## 2016-11-19 LAB — PROTIME-INR
INR: 4.1
PROTHROMBIN TIME: 39.5 s — AB (ref 11.4–15.2)

## 2016-11-19 LAB — LIPASE, BLOOD: LIPASE: 28 U/L (ref 11–51)

## 2016-11-19 MED ORDER — WARFARIN SODIUM 7.5 MG PO TABS: 7.5000 mg | ORAL_TABLET | Freq: Once | ORAL | Status: DC

## 2016-11-19 MED ORDER — CYCLOBENZAPRINE HCL 10 MG PO TABS
5.0000 mg | ORAL_TABLET | Freq: Every day | ORAL | Status: DC
  Administered 2016-11-19 – 2016-11-26 (×8): 5 mg via ORAL
  Filled 2016-11-19 (×8): qty 1

## 2016-11-19 MED ORDER — ACETAMINOPHEN 325 MG PO TABS
650.0000 mg | ORAL_TABLET | Freq: Four times a day (QID) | ORAL | Status: DC | PRN
  Administered 2016-11-21: 650 mg via ORAL
  Filled 2016-11-19: qty 2

## 2016-11-19 MED ORDER — FLUTICASONE PROPIONATE 50 MCG/ACT NA SUSP
1.0000 | Freq: Every day | NASAL | Status: DC
  Administered 2016-11-19 – 2016-11-27 (×9): 1 via NASAL
  Filled 2016-11-19 (×2): qty 16

## 2016-11-19 MED ORDER — METOPROLOL SUCCINATE ER 25 MG PO TB24
75.0000 mg | ORAL_TABLET | Freq: Every day | ORAL | Status: DC
  Administered 2016-11-19 – 2016-11-23 (×5): 75 mg via ORAL
  Filled 2016-11-19 (×5): qty 1

## 2016-11-19 MED ORDER — ROSUVASTATIN CALCIUM 20 MG PO TABS
40.0000 mg | ORAL_TABLET | Freq: Every day | ORAL | Status: DC
  Administered 2016-11-19 – 2016-11-26 (×8): 40 mg via ORAL
  Filled 2016-11-19 (×8): qty 2

## 2016-11-19 MED ORDER — ALBUTEROL SULFATE (2.5 MG/3ML) 0.083% IN NEBU
2.5000 mg | INHALATION_SOLUTION | Freq: Four times a day (QID) | RESPIRATORY_TRACT | Status: DC | PRN
  Administered 2016-11-19 – 2016-11-20 (×2): 2.5 mg via RESPIRATORY_TRACT
  Filled 2016-11-19 (×2): qty 3

## 2016-11-19 MED ORDER — TORSEMIDE 20 MG PO TABS
60.0000 mg | ORAL_TABLET | Freq: Every day | ORAL | Status: DC
  Administered 2016-11-19 – 2016-11-22 (×4): 60 mg via ORAL
  Filled 2016-11-19 (×5): qty 3

## 2016-11-19 MED ORDER — SODIUM CHLORIDE 0.9 % IV BOLUS (SEPSIS)
500.0000 mL | Freq: Once | INTRAVENOUS | Status: AC
  Administered 2016-11-19: 500 mL via INTRAVENOUS

## 2016-11-19 MED ORDER — ALBUTEROL SULFATE HFA 108 (90 BASE) MCG/ACT IN AERS: 1.0000 | INHALATION_SPRAY | Freq: Four times a day (QID) | RESPIRATORY_TRACT | Status: DC | PRN

## 2016-11-19 MED ORDER — DEXTROSE 5 % IV SOLN
2.0000 g | Freq: Once | INTRAVENOUS | Status: AC
  Administered 2016-11-19: 2 g via INTRAVENOUS
  Filled 2016-11-19: qty 2

## 2016-11-19 MED ORDER — VANCOMYCIN HCL IN DEXTROSE 1-5 GM/200ML-% IV SOLN
1000.0000 mg | INTRAVENOUS | Status: DC
  Administered 2016-11-20 – 2016-11-23 (×4): 1000 mg via INTRAVENOUS
  Filled 2016-11-19 (×4): qty 200

## 2016-11-19 MED ORDER — VANCOMYCIN HCL IN DEXTROSE 1-5 GM/200ML-% IV SOLN
1000.0000 mg | Freq: Once | INTRAVENOUS | Status: DC
  Filled 2016-11-19: qty 200

## 2016-11-19 MED ORDER — PANTOPRAZOLE SODIUM 40 MG PO TBEC
40.0000 mg | DELAYED_RELEASE_TABLET | Freq: Every day | ORAL | Status: DC
  Administered 2016-11-19 – 2016-11-27 (×9): 40 mg via ORAL
  Filled 2016-11-19 (×9): qty 1

## 2016-11-19 MED ORDER — INSULIN GLARGINE 100 UNIT/ML ~~LOC~~ SOLN
30.0000 [IU] | Freq: Every day | SUBCUTANEOUS | Status: DC
  Administered 2016-11-19 – 2016-11-21 (×3): 30 [IU] via SUBCUTANEOUS
  Filled 2016-11-19 (×4): qty 0.3

## 2016-11-19 MED ORDER — DEXTROSE 5 % IV SOLN
INTRAVENOUS | Status: AC
  Filled 2016-11-19: qty 2

## 2016-11-19 MED ORDER — ISOSORBIDE MONONITRATE ER 60 MG PO TB24
60.0000 mg | ORAL_TABLET | Freq: Every day | ORAL | Status: DC
  Administered 2016-11-19 – 2016-11-22 (×4): 60 mg via ORAL
  Filled 2016-11-19 (×5): qty 1

## 2016-11-19 MED ORDER — CEFEPIME HCL 1 G IJ SOLR
1.0000 g | INTRAMUSCULAR | Status: DC
  Administered 2016-11-20 – 2016-11-23 (×4): 1 g via INTRAVENOUS
  Filled 2016-11-19 (×6): qty 1

## 2016-11-19 MED ORDER — CEFEPIME HCL 2 G IJ SOLR: 2.0000 g | Freq: Once | INTRAMUSCULAR | Status: DC

## 2016-11-19 MED ORDER — RANOLAZINE ER 500 MG PO TB12
500.0000 mg | ORAL_TABLET | Freq: Two times a day (BID) | ORAL | Status: DC
  Administered 2016-11-19 – 2016-11-20 (×2): 500 mg via ORAL
  Filled 2016-11-19 (×5): qty 1

## 2016-11-19 MED ORDER — INSULIN ASPART 100 UNIT/ML ~~LOC~~ SOLN
0.0000 [IU] | Freq: Three times a day (TID) | SUBCUTANEOUS | Status: DC
  Administered 2016-11-19 – 2016-11-20 (×4): 1 [IU] via SUBCUTANEOUS
  Administered 2016-11-21 (×2): 2 [IU] via SUBCUTANEOUS
  Administered 2016-11-21: 1 [IU] via SUBCUTANEOUS
  Administered 2016-11-22 (×2): 2 [IU] via SUBCUTANEOUS
  Administered 2016-11-23 – 2016-11-24 (×2): 3 [IU] via SUBCUTANEOUS

## 2016-11-19 MED ORDER — DIAZEPAM 5 MG PO TABS
5.0000 mg | ORAL_TABLET | Freq: Two times a day (BID) | ORAL | Status: DC | PRN
  Administered 2016-11-19: 5 mg via ORAL
  Filled 2016-11-19 (×2): qty 1

## 2016-11-19 MED ORDER — INSULIN GLARGINE 100 UNIT/ML ~~LOC~~ SOLN
40.0000 [IU] | Freq: Every day | SUBCUTANEOUS | Status: DC
  Filled 2016-11-19 (×2): qty 0.4

## 2016-11-19 MED ORDER — VANCOMYCIN HCL 10 G IV SOLR
1500.0000 mg | Freq: Once | INTRAVENOUS | Status: AC
  Administered 2016-11-19: 1500 mg via INTRAVENOUS
  Filled 2016-11-19: qty 1500

## 2016-11-19 MED ORDER — TICAGRELOR 60 MG PO TABS
60.0000 mg | ORAL_TABLET | Freq: Two times a day (BID) | ORAL | Status: DC
  Administered 2016-11-19 – 2016-11-27 (×16): 60 mg via ORAL
  Filled 2016-11-19 (×19): qty 1

## 2016-11-19 MED ORDER — VANCOMYCIN HCL 10 G IV SOLR: 1500.0000 mg | Freq: Two times a day (BID) | INTRAVENOUS | Status: DC

## 2016-11-19 MED ORDER — ONDANSETRON HCL 4 MG/2ML IJ SOLN: 4.0000 mg | Freq: Four times a day (QID) | INTRAMUSCULAR | Status: DC | PRN

## 2016-11-19 NOTE — Progress Notes (Signed)
PT MOVE FROM ROOM ICU09 TO ICU11 DUE TO PROBLEMS W/ TEMPERATURE CONTROL. PT NOW COMFORTABLE IN NEW ROOM.

## 2016-11-19 NOTE — H&P (Addendum)
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 Patient was optimized and discharged home on 9/12 with a new regimen of medications for heart failure. She felt fine for a few hours and then started developing lack of appetite. She states that she has not eaten anything since substantial since her discharge. She feels weak and dehydrated. She has been taking her diuretic regimen. She also noticed shortness of breath this morning with minimal exertion. She also noticed worsening dependent edema. No fever she has a mucopurulent cough with tinge of blood. ED course Blood pressure 100 by EDP, pulse 127, CT of the chest shows a large right and small left pleural effusion with adjacent atelectasis. No significant consolidation or pneumothorax. Creatinine 3 up from 2.8. Patient admitted for atrial fibrillation with RVR as well as dyspnea in the setting of worsening renal function.     Review of Systems: negative for the following   12-point ROS otherwise negative. Pertinent positives in history of present illness   Past Medical History:  Diagnosis Date  . Acute renal failure superimposed on stage 4 chronic kidney disease ([place]) 07/04/2015  . Allergic rhinitis   . Anemia   . Anxiety   . CAD in native artery    NSTEMI 07/2015 s/p DES to RCA and posterior PDA, PCI 10/2015 with scoring balloon to 85% ISR of distal RCA), known LAD/Cx disease treated medically  . Carotid artery  disease ([place])    Mild bilateral carotid disease (1-39% 07/2015)  . Chronic combined systolic and diastolic CHF (congestive heart failure) ([place])   . CKD (chronic kidney disease), stage IV ([place])   . Constipation   . Diabetes mellitus   . GERD (gastroesophageal reflux disease)   . Heart murmur, systolic   . History of hysterectomy   . Hyperlipidemia   . Hypertension   . Low back pain   . Obesity   . 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 Osteoarthritis   . Overactive bladder   . PVC's (premature ventricular contractions)      Past Surgical History:  Procedure Laterality Date  . ABDOMINAL HYSTERECTOMY    . BACK SURGERY     multiple  . CARDIAC CATHETERIZATION  04/04/2006   Est EF of 60%  . CARDIAC CATHETERIZATION N/A 07/04/2015   Procedure: Left Heart Cath and Coronary Angiography;  Surgeon: Troy Sine, MD;  Location: [place] [facility];  Service: Cardiovascular;  Laterality: N/A;  . CARDIAC CATHETERIZATION N/A 07/04/2015   Procedure: Coronary Stent Intervention;  Surgeon: Troy Sine, MD;  Location: [place] [facility];  Service: Cardiovascular;  Laterality: N/A;  . CARDIAC CATHETERIZATION N/A 10/13/2015   Procedure: Left Heart Cath and Coronary Angiography;  Surgeon: Troy Sine, MD;  Location: [place];  Service: Cardiovascular;  Laterality: N/A;  . CARDIAC CATHETERIZATION N/A 12/02/2015   Procedure: Left Heart Cath and Coronary Angiography;  Surgeon: Peter M Martinique, MD;  Location: [facility]  INVASIVE CV LAB;  Service: Cardiovascular;  Laterality: N/A;  . CARPAL TUNNEL RELEASE    . CERVICAL BIOPSY     cervical lymph node biopsies  . COLONOSCOPY  May 2002   Dr. Irving Shows :Followup in 5 years, normal exam  . COLONOSCOPY  2008   Dr. Laural Golden: Very redundant colon with mild melanosis coli, splenic flexure polyp biopsy with acute complaint of benign colon polyp. Recommended ten-year followup   . CORONARY STENT PLACEMENT  04/11/2006   2 -- Taxus stents to the circumflex   . PERIPHERAL VASCULAR CATHETERIZATION Right 07/09/2015   Procedure: Upper Extremity Angiography;  Surgeon: Conrad Teachey, MD;  Location: [place];  Service: Cardiovascular;  Laterality: Right;  . PERIPHERAL VASCULAR CATHETERIZATION Right 07/10/2015   Procedure: RIGHT SUBCLAVIAN ARTERY THROMBECTOMY;  Surgeon: Serafina Mitchell, MD;  Location: [facility];  Service: Vascular;  Laterality: Right;  . tendonitis     bilateral elbow  . TRIGGER FINGER RELEASE        Social History:  reports that she has never smoked. She has never used smokeless tobacco. She reports that she does not drink alcohol or use drugs.    Allergies  Allergen Reactions  . Ace Inhibitors Cough  . Amlodipine     Edema   . Codeine Rash    Family History  Problem Relation Age of Onset  . Diabetes Father   . Hypertension Father   . Stroke Father   . Hypertension Brother   . Aneurysm Brother   . Diabetes Brother   . Colon cancer Neg Hx         Prior to Admission medications   Medication Sig Start Date End Date Taking? Authorizing Provider  acetaminophen (TYLENOL) 500 MG tablet Take 2 tablets (1,000 mg total) by mouth every 6 (six) hours as needed (pain). 07/12/15  Yes Lily Kocher, MD  albuterol (PROVENTIL HFA;VENTOLIN HFA) 108 (90 Base) MCG/ACT inhaler Inhale 1-2 puffs into the lungs every 6 (six) hours as needed for wheezing or shortness of breath. 04/01/16  Yes Nanavati, Ankit, MD  albuterol (PROVENTIL) (2.5 MG/3ML) 0.083% nebulizer solution Take 3 mLs (2.5 mg total) by nebulization every 6 (six) hours as needed for wheezing or shortness of breath. 07/16/16  Yes Elgergawy, Silver Huguenin, MD  cefUROXime (CEFTIN) 500 MG tablet Take 1 tablet (500 mg total) by mouth daily with breakfast. 11/17/16 11/22/16 Yes Reyne Dumas, MD  cyclobenzaprine (FLEXERIL) 5 MG tablet Take 5 mg by mouth at bedtime. 10/03/16  Yes [provider]  diazepam  (VALIUM) 5 MG tablet Take 1 tablet (5 mg total) by mouth every 12 (twelve) hours as needed for muscle spasms. 10/29/16  Yes Julianne Rice, MD  ferrous sulfate 325 (65 FE) MG tablet Take 325 mg by mouth daily with breakfast.    Yes [provider]  fluticasone (FLONASE) 50 MCG/ACT nasal spray Place 1 spray into both nostrils daily.   Yes [provider]  hydrALAZINE (APRESOLINE) 100 MG tablet Take 1 tablet (100 mg total) by mouth 3 (three) times daily. 11/16/16  Yes Reyne Dumas, MD  insulin glargine (LANTUS) 100 UNIT/ML injection Inject 40 Units into the skin at bedtime.   Yes [provider]  insulin lispro (HUMALOG KWIKPEN) 100 UNIT/ML KiwkPen INJECT 18 TO 24 UNITS TOTAL INTO THE SKIN THREE TIMES DAILY 10/05/16  Yes Nida, Marella Chimes, MD  isosorbide mononitrate (IMDUR) 60 MG 24 hr tablet Take 1 tablet (60 mg total) by mouth daily. 07/12/15  Yes  Lily Kocher, MD  latanoprost (XALATAN) 0.005 % ophthalmic solution Place 1 drop into both eyes at bedtime.    Yes [provider]  meclizine (ANTIVERT) 25 MG tablet Take 12.5 mg by mouth 2 (two) times daily.    Yes [provider]  metoprolol succinate (TOPROL-XL) 25 MG 24 hr tablet Take 3 tablets (75 mg total) by mouth daily. Take with or immediately following a meal. 11/17/16  Yes Reyne Dumas, MD  Multiple Vitamin (MULTIVITAMIN WITH MINERALS) TABS tablet Take 1 tablet by mouth daily.   Yes [provider]  nitroGLYCERIN (NITROSTAT) 0.4 MG SL tablet Place 1 tablet (0.4 mg total) under the tongue every 5 (five) minutes as needed for chest pain. 10/14/15  Yes Simmons, Brittainy M, PA-C  pantoprazole (PROTONIX) 40 MG tablet Take 40 mg by mouth daily.    Yes [provider]  potassium chloride SA (K-DUR,KLOR-CON) 20 MEQ tablet Take 2 tablets (40 mEq total) by mouth daily. 10/14/15  Yes Simmons, Brittainy M, PA-C  RANEXA 500 MG 12 hr tablet TAKE ONE TABLET BY MOUTH TWICE DAILY 05/04/16  Yes Troy Sine, MD  rosuvastatin (CRESTOR) 40 MG tablet Take 1 tablet (40 mg total) by mouth at bedtime. 07/12/15  Yes Lily Kocher, MD  ticagrelor (BRILINTA) 60 MG TABS tablet Take 1 tablet (60 mg total) by mouth 2 (two) times daily. 11/16/16  Yes Reyne Dumas, MD  torsemide (DEMADEX) 20 MG tablet Take 60 mg by mouth 2 (two) times daily. Pt is able to take an additional tablet daily for weight gain greater than 3lbs.   Yes [provider]  trolamine salicylate (ASPERCREME) 10 % cream Apply 1 application topically as needed for muscle pain.   Yes [provider]  warfarin (COUMADIN) 7.5 MG tablet Take 1 tablet (7.5 mg total) by mouth once. 11/16/16 11/16/16  Reyne Dumas, MD     Physical Exam: Vitals:   11/19/16 1127 11/19/16 1143 11/19/16 1145 11/19/16 1157  BP:    (!) 166/85  Pulse:   84 84  Resp:      Temp: 98 F (36.7 C) (!) 97.4 F (36.3 C) (!) 97.4 F (36.3 C) (!) 97.4 F (36.3 C)  TempSrc: Oral Axillary Axillary   SpO2:    98%  Weight:  96.2 kg (212 lb 1.3 oz)  96.4 kg (212 lb 8.4 oz)  Height:  5\' 8"  (1.727 m)  5\' 8"  (1.727 m)        Vitals:   11/19/16 1127 11/19/16 1143 11/19/16 1145 11/19/16 1157  BP:    (!) 166/85  Pulse:   84 84  Resp:      Temp: 98 F (36.7 C) (!) 97.4 F (36.3 C) (!) 97.4 F (36.3 C) (!) 97.4 F (36.3 C)  TempSrc: Oral Axillary Axillary   SpO2:    98%  Weight:  96.2 kg (212 lb 1.3 oz)  96.4 kg (212 lb 8.4 oz)  Height:  5\' 8"  (1.727 m)  5\' 8"  (1.727 m)   Constitutional: NAD, calm, comfortable Eyes: PERRL, lids and conjunctivae normal ENMT: Mucous membranes are moist. Posterior pharynx clear of any exudate or lesions.Normal dentition.  Neck: normal, supple, no masses, no thyromegaly Respiratory:Decreased breath sounds in the right lung base, no wheezing, no crackles. Normal respiratory effort. No accessory muscle use.  Cardiovascular: Regular rate and rhythm, no murmurs / rubs / gallops. 2+ pitting edema. 2+ pedal pulses. No  carotid bruits.  Abdomen: no tenderness, no masses palpated. No hepatosplenomegaly.  Bowel sounds positive.  Musculoskeletal: no clubbing / cyanosis. No joint deformity upper and lower extremities. Good ROM, no contractures. Normal muscle tone.  Skin: no rashes, lesions, ulcers. No induration Neurologic: CN 2-12 grossly intact. Sensation intact, DTR normal. Strength 5/5 in all 4.  Psychiatric: Normal judgment and insight. Alert and oriented x 3. Normal mood.     Labs on Admission: I have personally reviewed following labs and imaging studies  CBC:  Recent Labs Lab 11/13/16 0736 11/14/16 0409 11/16/16 0613 11/19/16 0815  WBC 8.0 6.5 8.1 7.6  NEUTROABS  --   --   --  6.5  HGB 8.9* 8.9* 8.8* 8.1*  HCT 27.0* 26.5* 27.7* 25.0*  MCV 96.4 96.4 96.2 95.1  PLT 360 344 354 161    Basic Metabolic Panel:  Recent Labs Lab 11/13/16 0736 11/14/16 0409 11/15/16 0609 11/16/16 0613 11/19/16 0815  NA 137 134* 134* 134* 134*  K 4.4 4.2 3.3* 3.9 4.3  CL 98* 97* 96* 98* 96*  CO2 25 25 24 24 25   GLUCOSE 118* 176* 127* 182* 181*  BUN 71* 71* 65* 62* 67*  CREATININE 2.90* 2.95* 2.87* 2.81* 3.00*  CALCIUM 9.6 9.3 9.1 9.1 9.0  MG 2.3  --  2.3  --   --   PHOS 3.6  --   --   --   --     GFR: Estimated Creatinine Clearance: 20.9 mL/min (A) (by C-G formula based on SCr of 3 mg/dL (H)).  Liver Function Tests:  Recent Labs Lab 11/13/16 0736 11/15/16 0609 11/19/16 0815  AST 64* 51* 62*  ALT 61* 54 54  ALKPHOS 179* 169* 164*  BILITOT 0.6 0.6 0.4  PROT 7.7 7.6 7.6  ALBUMIN 3.4* 3.2* 2.9*    Recent Labs Lab 11/19/16 0815  LIPASE 28   No results for input(s): AMMONIA in the last 168 hours.  Coagulation Profile:  Recent Labs Lab 11/15/16 0602 11/16/16 0613 11/19/16 0815  INR 1.59 1.48 4.10*   No results for input(s): DDIMER in the last 72 hours.  Cardiac Enzymes: No results for input(s): CKTOTAL, CKMB, CKMBINDEX, TROPONINI in the last 168 hours.  BNP (last 3 results) No  results for input(s): PROBNP in the last 8760 hours.  HbA1C: No results for input(s): HGBA1C in the last 72 hours. Lab Results  Component Value Date   HGBA1C 8.8 (H) 10/31/2016   HGBA1C 9.6 09/28/2016   HGBA1C 10.3 (H) 07/09/2016     CBG:  Recent Labs Lab 11/15/16 2045 11/16/16 0733 11/16/16 1111 11/16/16 1625 11/19/16 1155  GLUCAP 274* 170* 196* 224* 145*    Lipid Profile: No results for input(s): CHOL, HDL, LDLCALC, TRIG, CHOLHDL, LDLDIRECT in the last 72 hours.  Thyroid Function Tests: No results for input(s): TSH, T4TOTAL, FREET4, T3FREE, THYROIDAB in the last 72 hours.  Anemia Panel: No results for input(s): VITAMINB12, FOLATE, FERRITIN, TIBC, IRON, RETICCTPCT in the last 72 hours.  Urine analysis:    Component Value Date/Time   COLORURINE YELLOW 11/10/2016 2055   APPEARANCEUR CLOUDY (A) 11/10/2016 2055   LABSPEC 1.012 11/10/2016 2055   PHURINE 5.0 11/10/2016 2055   GLUCOSEU NEGATIVE 11/10/2016 2055   HGBUR NEGATIVE 11/10/2016 2055   [place] NEGATIVE 11/10/2016 2055   [place] NEGATIVE 11/10/2016 2055   PROTEINUR 100 (A) 11/10/2016 2055   UROBILINOGEN 0.2 08/23/2007 2037   NITRITE NEGATIVE 11/10/2016 2055   LEUKOCYTESUR MODERATE (A) 11/10/2016 2055    Sepsis Labs: @LABRCNTIP (procalcitonin:4,lacticidven:4) ) Recent Results (from the past 240 hour(s))  Urine  culture     Status: Abnormal   Collection Time: 11/10/16  8:55 PM  Result Value Ref Range Status   Specimen Description URINE, CLEAN CATCH  Final   Special Requests NONE  Final   Culture 50,000 COLONIES/mL ESCHERICHIA COLI (A)  Final   Report Status 11/13/2016 FINAL  Final   Organism ID, Bacteria ESCHERICHIA COLI (A)  Final      Susceptibility   Escherichia coli - MIC*    AMPICILLIN >=32 RESISTANT Resistant     CEFAZOLIN <=4 SENSITIVE Sensitive     CEFTRIAXONE <=1 SENSITIVE Sensitive     CIPROFLOXACIN >=4 RESISTANT Resistant     GENTAMICIN <=1 SENSITIVE Sensitive     IMIPENEM <=0.25  SENSITIVE Sensitive     NITROFURANTOIN <=16 SENSITIVE Sensitive     TRIMETH/SULFA >=320 RESISTANT Resistant     AMPICILLIN/SULBACTAM >=32 RESISTANT Resistant     PIP/TAZO 64 INTERMEDIATE Intermediate     Extended ESBL NEGATIVE Sensitive     * 50,000 COLONIES/mL ESCHERICHIA COLI  MRSA PCR Screening     Status: None   Collection Time: 11/11/16  8:02 AM  Result Value Ref Range Status   MRSA by PCR NEGATIVE NEGATIVE Final    Comment:        The GeneXpert MRSA Assay (FDA approved for NASAL specimens only), is one component of a comprehensive MRSA colonization surveillance program. It is not intended to diagnose MRSA infection nor to guide or monitor treatment for MRSA infections.          Radiological Exams on Admission: Dg Chest 2 View  Result Date: 11/19/2016 CLINICAL DATA:  Short of breath. Discharged 3 days ago with congestive heart failure. EXAM: CHEST  2 VIEW COMPARISON:  11/10/2016 FINDINGS: Degraded lateral view secondary to positioning and overlying artifact. Midline trachea. Normal heart size for level of inspiration. Increase in moderate right pleural effusion. No pneumothorax. Low lung volumes. Mild pulmonary venous congestion. Worsened right and developing mild left base airspace disease. IMPRESSION: Increase in moderate right pleural effusion with adjacent atelectasis or infection. Mild pulmonary venous congestion with minimal left base atelectasis. Electronically Signed   By: Abigail Miyamoto M.D.   On: 11/19/2016 09:08   Ct Chest Wo Contrast  Result Date: 11/19/2016 CLINICAL DATA:  Shortness of breath. EXAM: CT CHEST WITHOUT CONTRAST TECHNIQUE: Multidetector CT imaging of the chest was performed following the standard protocol without IV contrast. COMPARISON:  Chest x-ray from same day. CT chest dated October 31, 2016. FINDINGS: Cardiovascular: Mild cardiomegaly, unchanged. New small pericardial effusion. Coronary, aortic arch, and branch vessel atherosclerotic vascular  disease. Mediastinum/Nodes: Prominent and mildly enlarged mediastinal lymph nodes are stable to slightly decreased in size. No axillary lymphadenopathy. The thyroid gland, trachea, and esophagus are unremarkable. Lungs/Pleura: Large right and small left pleural effusions. Right greater than left bibasilar atelectasis. Additional atelectasis in the right middle lobe. No suspicious pulmonary nodules. No consolidation or pneumothorax. Upper Abdomen: No acute abnormality. Musculoskeletal: No chest wall mass or suspicious bone lesions identified. No fracture. Unchanged degenerative changes of the thoracic spine, worst at T7-T8 and T10-T11. IMPRESSION: 1. Large right and small left pleural effusions with adjacent right greater than left atelectasis. 2. New small pericardial effusion. 3. Stable to slightly decreased in size mildly enlarged mediastinal lymph nodes, likely reactive. 4.  Aortic atherosclerosis (ICD10-I70.0). Electronically Signed   By: Titus Dubin M.D.   On: 11/19/2016 11:16   Dg Chest 1 View  Result Date: 10/31/2016 CLINICAL DATA:  Status post right thoracentesis. EXAM:  CHEST 1 VIEW COMPARISON:  10/31/2016. FINDINGS: Stable enlarged cardiac silhouette. Minimal left lower lobe atelectasis with improvement. Minimal bilateral pleural fluid, mildly improved on the left and significantly improved on the right following thoracentesis. No pneumothorax. Thoracic spine degenerative changes. IMPRESSION: 1. Almost completely resolved right pleural fluid following thoracentesis without pneumothorax. 2. Small left pleural effusion with improvement with decreased left basilar atelectasis. Electronically Signed   By: Claudie Revering M.D.   On: 10/31/2016 13:30   Dg Chest 2 View  Result Date: 11/19/2016 CLINICAL DATA:  Short of breath. Discharged 3 days ago with congestive heart failure. EXAM: CHEST  2 VIEW COMPARISON:  11/10/2016 FINDINGS: Degraded lateral view secondary to positioning and overlying artifact.  Midline trachea. Normal heart size for level of inspiration. Increase in moderate right pleural effusion. No pneumothorax. Low lung volumes. Mild pulmonary venous congestion. Worsened right and developing mild left base airspace disease. IMPRESSION: Increase in moderate right pleural effusion with adjacent atelectasis or infection. Mild pulmonary venous congestion with minimal left base atelectasis. Electronically Signed   By: Abigail Miyamoto M.D.   On: 11/19/2016 09:08   Dg Chest 2 View  Result Date: 10/31/2016 CLINICAL DATA:  Chest pain and shortness of breath.  Cough. EXAM: CHEST  2 VIEW COMPARISON:  08/07/2016 FINDINGS: There is cardiomegaly. Bilateral pleural effusions, moderate on the right and small on the left. There is vascular congestion and peribronchial cuffing suggesting pulmonary edema. No pneumothorax. IMPRESSION: Cardiomegaly with bilateral pleural effusions and vascular congestion. Peribronchial cuffing may reflect pulmonary edema or bronchial inflammation. Electronically Signed   By: Jeb Levering M.D.   On: 10/31/2016 06:11   Ct Soft Tissue Neck Wo Contrast  Result Date: 10/29/2016 CLINICAL DATA:  Left-sided neck pain after doing exercises in cardiac rehab last week. EXAM: CT NECK WITHOUT CONTRAST TECHNIQUE: Multidetector CT imaging of the neck was performed following the standard protocol without intravenous contrast. COMPARISON:  None. FINDINGS: Pharynx and larynx: No noted mass or inflammation. Limited at the level of the larynx due to patient motion. Salivary glands: No inflammation, mass, or stone. Thyroid: Normal. Lymph nodes: No enlargement or abnormal density of cervical lymph nodes. Vascular: Limited without contrast. Moderate calcified plaque at the carotid bifurcations. Limited intracranial: Negative Visualized orbits: Partly seen. No acute finding. Right cataract resection. Mastoids and visualized paranasal sinuses: Lobulated soft tissue in the left maxillary could be polyp or  lobulated retention cyst. No destructive changes in the neighboring bone. Skeleton: Negative for acute or destructive finding. There are calcified disc bulges from C2-3 to C6-7, with ligamentum flavum thickening and calcification at C4-5. These cause cord deformity at C3-4 and C4-5. Patient has undergone laminectomy at C5-6. Upper chest: Left peritracheal soft tissue is likely multiple borderline enlarged lymph nodes rather than 1 large node or nodal conglomerate. These could be congestive given layering pleural effusions, small where seen. IMPRESSION: 1. Limited by patient motion and lack of IV contrast. No specific explanation for acute neck pain. 2. Advanced cervical spine degeneration with calcified disc bulges and ligamentum flavum thickening. 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 3. Layering pleural effusions, small where seen. 4. Polypoid soft tissue in the left maxillary sinus, nonspecific without contrast. Electronically Signed   By: Monte Fantasia M.D.   On: 10/29/2016 18:39   Ct Chest Wo Contrast  Result Date: 11/19/2016 CLINICAL DATA:  Shortness of breath. EXAM: CT CHEST WITHOUT CONTRAST TECHNIQUE: Multidetector CT imaging of the chest was performed following  the standard protocol without IV contrast. COMPARISON:  Chest x-ray from same day. CT chest dated October 31, 2016. FINDINGS: Cardiovascular: Mild cardiomegaly, unchanged. New small pericardial effusion. Coronary, aortic arch, and branch vessel atherosclerotic vascular disease. Mediastinum/Nodes: Prominent and mildly enlarged mediastinal lymph nodes are stable to slightly decreased in size. No axillary lymphadenopathy. The thyroid gland, trachea, and esophagus are unremarkable. Lungs/Pleura: Large right and small left pleural effusions. Right greater than left bibasilar atelectasis. Additional atelectasis in the right middle lobe. No suspicious pulmonary nodules. No consolidation or  pneumothorax. Upper Abdomen: No acute abnormality. Musculoskeletal: No chest wall mass or suspicious bone lesions identified. No fracture. Unchanged degenerative changes of the thoracic spine, worst at T7-T8 and T10-T11. IMPRESSION: 1. Large right and small left pleural effusions with adjacent right greater than left atelectasis. 2. New small pericardial effusion. 3. Stable to slightly decreased in size mildly enlarged mediastinal lymph nodes, likely reactive. 4.  Aortic atherosclerosis (ICD10-I70.0). Electronically Signed   By: Titus Dubin M.D.   On: 11/19/2016 11:16   Ct Chest Wo Contrast  Result Date: 10/31/2016 CLINICAL DATA:  Shortness of breath with exertion EXAM: CT CHEST WITHOUT CONTRAST TECHNIQUE: Multidetector CT imaging of the chest was performed following the standard protocol without IV contrast. COMPARISON:  Chest x-ray 10/31/2016 FINDINGS: Cardiovascular: Aortic and coronary artery calcifications. No aneurysm. Mild cardiomegaly. Mediastinum/Nodes: Scattered borderline and mildly enlarged mediastinal lymph nodes. Index AP window lymph node has a short axis diameter of 18 mm. Index right paratracheal node has a short axis diameter of 9 mm. No axillary adenopathy. Lungs/Pleura: Large right pleural effusion and small left pleural effusion. Bibasilar atelectasis. Mild vascular congestion. Upper Abdomen: Imaging into the upper abdomen shows no acute findings. Musculoskeletal: Chest wall soft tissues are unremarkable. No acute bony abnormality. IMPRESSION: Large right pleural effusion and small left effusion. Bibasilar atelectasis. Vascular congestion. Extensive coronary artery calcifications. Mildly enlarged mediastinal lymph nodes, likely reactive. Aortic Atherosclerosis (ICD10-I70.0). Electronically Signed   By: Rolm Baptise M.D.   On: 10/31/2016 07:08   Nm Myocar Multi W/spect W/wall Motion / Ef  Result Date: 11/15/2016  There was no ST segment deviation noted during stress. T wave  inversions inferiorly.  Defect 1: There is a large defect of severe severity present in the basal inferolateral, basal anterolateral, mid anterior, mid anteroseptal, mid inferolateral, mid anterolateral, apical anterior, apical inferior, apical lateral and apex location.  This is a high risk study.  Findings consistent with prior myocardial infarction. No ischemic territories.  Nuclear stress EF: 21%.    Dg Chest Port 1 View  Result Date: 11/10/2016 CLINICAL DATA:  Shortness of breath tonight EXAM: PORTABLE CHEST 1 VIEW COMPARISON:  October 31, 2016 FINDINGS: The mediastinal contour is normal. The heart size is mildly enlarged. There is a small right pleural effusion with patchy consolidation of right lung base. The left lung is clear. No acute abnormalities identified in the visualized bones. IMPRESSION: Small right pleural effusion with patchy consolidation of right lung base, underline pneumonia is not excluded. Electronically Signed   By: Abelardo Diesel M.D.   On: 11/10/2016 21:28   US Thoracentesis Asp Pleural Space W/img Guide  Result Date: 10/31/2016 INDICATION: Right pleural effusion. EXAM: ULTRASOUND GUIDED RIGHT THORACENTESIS MEDICATIONS: None. COMPLICATIONS: None immediate. PROCEDURE: An ultrasound guided thoracentesis was thoroughly discussed with the patient and questions answered. The benefits, risks, alternatives and complications were also discussed. The patient understands and wishes to proceed with the procedure. Written consent was obtained. Ultrasound was performed to localize  and mark an adequate pocket of fluid in the right chest. The area was then prepped and draped in the normal sterile fashion. 1% Lidocaine was used for local anesthesia. Under ultrasound guidance a Yuen catheter was introduced. Thoracentesis was performed. The catheter was removed and a dressing applied. FINDINGS: A total of approximately 1 L of blood-tinged fluid was removed. Samples were sent to the laboratory as  requested by the clinical team. IMPRESSION: Successful ultrasound guided right thoracentesis yielding 1 L of pleural fluid. The patient tolerated the procedure well. No immediate complications. Electronically Signed   By: Lorriane Shire M.D.   On: 10/31/2016 13:09      EKG: Independently reviewed. Date/Time:                  Saturday November 19 2016 08:01:03 EDT Ventricular Rate:   130 PR Interval:                        QRS Duration:        105 QT Interval:                      341 QTC Calculation:    502 R Axis:                         26 Text Interpretation:  Atrial fibrillation Low voltage  Assessment/Plan     atrial fibrillation with RVR, RVR has resolved Heart rate has improved since admission, continue metoprolol    TSH was normal previous admission - CHA2DS2 VASc score is 5 - started on anticoagulation with Coumadin by cardiology , continue Brilinta to 60 mg bid . INR supratherapeutic Coumadin will be dosed by pharmacy  Shortness of breath Multifactorial secondary to worsening right pleural effusion/occult pneumonia/bronchitis/atrial fibrillation with rapid ventricular response. 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 Will resume torsemide at 60 mg a day tomorrow. Continue- Metoprolol was increased to 75 mg daily and hydralazine for more optimal BP control . Avoid ACE/ARB due to renal fn   Acute kidney injury on chronic kidney disease stage II Baseline creatinine 2.0 - did not improve with volume expansion previously-cr is now close to 3,  perhaps she suffered a period of poor perfusion in  setting of Afib w/ RVR /dehydration due to poor oral intake - hold diuretics today and resume torsemide at a lower dose tomorrow    CAD w/ stents in mid RCA and distal RCA/PDA Last cardiac cath Sept 2017 -   EF has worsened - Ranexa stopped in setting of worsening renal fxn  Lexiscan Myoview stress test on 11/15/16. nuclear stress test demonstrative of a large area of scar with no ischemia coronary angiography given her CKD stage IV and high likelihood for development of contrast-mediated nephropathy and need for dialysis  Subclavian artery occlusion S/p thromboembolectomy - continue aspirin and Brillinta  Anemia of chronic disease No evidence of acute blood loss - likely due to chronic kidney disease - follow trend   E coli UTI  Completed treatment  DM 2 Poorly controlled - A1c 8.8  Continue Lantus and  sliding scale insulin  Generalized anxiety disorder Appears well compensated at present        DVT prophylaxis: Coumadin     consults called: Cardiology  Family Communication: Admission, patients condition and plan of care including tests being ordered have been discussed with the patient  who indicates understanding and agree with the plan and Code Status  Admission status: inpatient    Disposition plan: Further plan will depend as patient's clinical course evolves and further radiologic and laboratory data become available. Likely home when stable   At the time of admission, it appears that the appropriate admission status for this patient is INPATIENT .Thisis judged to be reasonable and necessary in order to provide the required intensity of service to ensure the patient's safetygiven thepresenting symptoms, physical exam findings, and initial radiographic and laboratory data in the context of their chronic comorbidities.   Reyne Dumas MD [facility] Pager [phone]  If 7PM-7AM, please contact night-coverage [email or web address] Password  [email or web address]  11/19/2016, 1:09 PM

## 2016-11-19 NOTE — ED Notes (Signed)
CRITICAL VALUE ALERT  Critical Value:  INR 4.10  Date & Time Notied:  11/19/2016 0813  Provider Notified: Dr. Laverta [place]  Orders Received/Actions taken: 11/19/2016, 8871

## 2016-11-19 NOTE — Progress Notes (Signed)
ANTICOAGULATION CONSULT NOTE - Initial Consult  Pharmacy Consult for coumadin Indication: atrial fibrillation  Allergies  Allergen Reactions  . Ace Inhibitors Cough  . Amlodipine     Edema   . Codeine Rash    Patient Measurements: Height: 5\' 8"  (172.7 cm) Weight: 212 lb 8.4 oz (96.4 kg) IBW/kg (Calculated) : 63.9  Vital Signs: Temp: 97.4 F (36.3 C) (09/15 1157) Temp Source: Axillary (09/15 1145) BP: 166/85 (09/15 1157) Pulse Rate: 84 (09/15 1157)  Labs:  Recent Labs  11/19/16 0815  HGB 8.1*  HCT 25.0*  PLT 372  LABPROT 39.5*  INR 4.10*  CREATININE 3.00*    Estimated Creatinine Clearance: 20.9 mL/min (A) (by C-G formula based on SCr of 3 mg/dL (H)).   Medical History: Past Medical History:  Diagnosis Date  . Acute renal failure superimposed on stage 4 chronic kidney disease ([place]) 07/04/2015  . Allergic rhinitis   . Anemia   . Anxiety   . CAD in native artery    NSTEMI 07/2015 s/p DES to RCA and posterior PDA, PCI 10/2015 with scoring balloon to 85% ISR of distal RCA), known LAD/Cx disease treated medically  . Carotid artery disease ([place])    Mild bilateral carotid disease (1-39% 07/2015)  . Chronic combined systolic and diastolic CHF (congestive heart failure) ([place])   . CKD (chronic kidney disease), stage IV ([place])   . Constipation   . Diabetes mellitus   . GERD (gastroesophageal reflux disease)   . Heart murmur, systolic   . History of hysterectomy   . Hyperlipidemia   . Hypertension   . Low back pain   . Obesity   . 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 Osteoarthritis   . Overactive bladder   . PVC's (premature ventricular contractions)     Medications:  Prescriptions Prior to Admission  Medication Sig Dispense Refill Last Dose  . acetaminophen (TYLENOL) 500 MG tablet Take 2 tablets (1,000 mg total) by mouth every 6 (six) hours as needed (pain). 30 tablet 0  11/18/2016 at Unknown time  . albuterol (PROVENTIL HFA;VENTOLIN HFA) 108 (90 Base) MCG/ACT inhaler Inhale 1-2 puffs into the lungs every 6 (six) hours as needed for wheezing or shortness of breath. 1 Inhaler 0 11/19/2016 at Unknown time  . albuterol (PROVENTIL) (2.5 MG/3ML) 0.083% nebulizer solution Take 3 mLs (2.5 mg total) by nebulization every 6 (six) hours as needed for wheezing or shortness of breath. 75 mL 1 11/19/2016 at Unknown time  . cefUROXime (CEFTIN) 500 MG tablet Take 1 tablet (500 mg total) by mouth daily with breakfast. 5 tablet 0 11/18/2016 at Unknown time  . cyclobenzaprine (FLEXERIL) 5 MG tablet Take 5 mg by mouth at bedtime.  0 Past Week at Unknown time  . diazepam (VALIUM) 5 MG tablet Take 1 tablet (5 mg total) by mouth every 12 (twelve) hours as needed for muscle spasms. 10 tablet 0 Past Week at Unknown time  . ferrous sulfate 325 (65 FE) MG tablet Take 325 mg by mouth daily with breakfast.    11/18/2016 at Unknown time  . fluticasone (FLONASE) 50 MCG/ACT nasal spray Place 1 spray into both nostrils daily.   11/18/2016 at Unknown time  . hydrALAZINE (APRESOLINE) 100 MG tablet Take 1 tablet (100 mg total) by mouth 3 (three) times daily. 120 tablet 1 11/18/2016 at Unknown time  . insulin glargine (LANTUS) 100 UNIT/ML injection Inject 40 Units into the skin at bedtime.  11/18/2016 at Unknown time  . insulin lispro (HUMALOG KWIKPEN) 100 UNIT/ML KiwkPen INJECT 18 TO 24 UNITS TOTAL INTO THE SKIN THREE TIMES DAILY 15 mL 2 11/18/2016 at Unknown time  . isosorbide mononitrate (IMDUR) 60 MG 24 hr tablet Take 1 tablet (60 mg total) by mouth daily. 30 tablet 0 11/18/2016 at Unknown time  . latanoprost (XALATAN) 0.005 % ophthalmic solution Place 1 drop into both eyes at bedtime.    11/18/2016 at Unknown time  . meclizine (ANTIVERT) 25 MG tablet Take 12.5 mg by mouth 2 (two) times daily.    11/18/2016 at Unknown time  . metoprolol succinate (TOPROL-XL) 25 MG 24 hr tablet Take 3 tablets (75 mg total) by  mouth daily. Take with or immediately following a meal. 30 tablet 2 11/18/2016 at 2200  . Multiple Vitamin (MULTIVITAMIN WITH MINERALS) TABS tablet Take 1 tablet by mouth daily.   Past Week at Unknown time  . nitroGLYCERIN (NITROSTAT) 0.4 MG SL tablet Place 1 tablet (0.4 mg total) under the tongue every 5 (five) minutes as needed for chest pain. 25 tablet 2 unknown at unknown  . pantoprazole (PROTONIX) 40 MG tablet Take 40 mg by mouth daily.    11/18/2016 at Unknown time  . potassium chloride SA (K-DUR,KLOR-CON) 20 MEQ tablet Take 2 tablets (40 mEq total) by mouth daily. 60 tablet 5 11/18/2016 at Unknown time  . RANEXA 500 MG 12 hr tablet TAKE ONE TABLET BY MOUTH TWICE DAILY 60 tablet 5 11/18/2016 at Unknown time  . rosuvastatin (CRESTOR) 40 MG tablet Take 1 tablet (40 mg total) by mouth at bedtime. 30 tablet 0 11/18/2016 at Unknown time  . ticagrelor (BRILINTA) 60 MG TABS tablet Take 1 tablet (60 mg total) by mouth 2 (two) times daily. 60 tablet 1 11/18/2016 at 2200  . torsemide (DEMADEX) 20 MG tablet Take 60 mg by mouth 2 (two) times daily. Pt is able to take an additional tablet daily for weight gain greater than 3lbs.   11/18/2016 at Unknown time  . trolamine salicylate (ASPERCREME) 10 % cream Apply 1 application topically as needed for muscle pain.   Past Week at Unknown time  . warfarin (COUMADIN) 7.5 MG tablet Take 1 tablet (7.5 mg total) by mouth once. 45 tablet 2     Assessment: 71 yo female with significant cardiac history including CHF, CAD s/p PCI 2018; CKD stage 4; now with new onset Afib (nonvalvular).  Pt was initially started on rivaroxaban but due to renal fxn was switched to WARFARIN on 9/10. INR elevated at 4.1  Goal of Therapy:  INR 2-3 Monitor platelets by anticoagulation protocol: Yes   Plan:  No coumadin today PT-INR daily Monitor for S/S of bleeding  Isac Sarna, BS [place], BCPS Clinical Pharmacist Pager [phone] 11/19/2016,12:15 PM

## 2016-11-19 NOTE — ED Triage Notes (Signed)
Pt reports woke up at 6am feeling weak and sob.  Reports used nebulizer without relief.  Pt says was discharged from hospital 2 days ago for same.

## 2016-11-19 NOTE — Progress Notes (Signed)
Pharmacy Antibiotic Note  [name] is a 71 y.o. female admitted on 11/19/2016 with pneumonia.  Pharmacy has been consulted for Vancomycin and Cefepime dosing.  Plan: Vancomycin 1500mg  loading dose, then 1000mg  IV every 24 hours.  Goal trough 15-20 mcg/mL.  Cefepime 2gm IV in ED, then 1gm IV q24h F/U cxs and clinical progress Monitor V/S, labs and levels as indicated  Height: 5\' 8"  (172.7 cm) Weight: 212 lb 8.4 oz (96.4 kg) IBW/kg (Calculated) : 63.9  Temp (24hrs), Avg:97.6 F (36.4 C), Min:97.4 F (36.3 C), Max:98 F (36.7 C)   Recent Labs Lab 11/13/16 0736 11/14/16 0409 11/15/16 0609 11/16/16 0613 11/19/16 0815  WBC 8.0 6.5  --  8.1 7.6  CREATININE 2.90* 2.95* 2.87* 2.81* 3.00*    Normalized CrCl is 41mls/min Estimated Creatinine Clearance: 20.9 mL/min (A) (by C-G formula based on SCr of 3 mg/dL (H)).    Allergies  Allergen Reactions  . Ace Inhibitors Cough  . Amlodipine     Edema   . Codeine Rash    Antimicrobials this admission: Vancomycin 9/15 >>  Cefepime 9/15 >>   Dose adjustments this admission: n/a  Microbiology results: 9/15 BCx: pending 9/7 MRSA PCR: neg  Thank you for allowing pharmacy to be a part of this patient's care.  Isac Sarna, BS [place], [place] Clinical Pharmacist Pager [phone]  11/19/2016 12:03 PM

## 2016-11-19 NOTE — ED Notes (Signed)
EKG given to Dr. Steffanie Mingle 

## 2016-11-19 NOTE — ED Provider Notes (Signed)
Emergency Department Provider Note   I have reviewed the triage vital signs and the nursing notes.   HISTORY  Chief Complaint Weakness and Shortness of Breath   HPI [name] is a 71 y.o. female with PMH of CAD, CKD, DM, GERD, HLD, HTN, and recent diagnosis of a-fib on Coumadin presents to the emergency department for evaluation of worsening generalized weakness, new onset shortness of breath, and fall/near-syncope at home this AM. Patient states since discharge food does not taste well to her and she's not been eating very much. She feels she is becoming progressively more weak. She has been compliant with her medications including iron supplementation. She denies any fevers or chills. No dysuria, hesitancy, urgency. Denies chest pain. No vomiting or diarrhea. She denies any obvious blood or black in her stools.  This morning she felt short of breath and tried her nebulizer treatments at home which did not give her much relief. She woke a family member in the house to try to assist her to the car which point she fell landing in the lap of her family member. No head trauma or LOC. She does not recall slipping and falling.    Past Medical History:  Diagnosis Date  . Acute renal failure superimposed on stage 4 chronic kidney disease ([place]) 07/04/2015  . Allergic rhinitis   . Anemia   . Anxiety   . CAD in native artery    NSTEMI 07/2015 s/p DES to RCA and posterior PDA, PCI 10/2015 with scoring balloon to 85% ISR of distal RCA), known LAD/Cx disease treated medically  . Carotid artery disease ([place])    Mild bilateral carotid disease (1-39% 07/2015)  . Chronic combined systolic and diastolic CHF (congestive heart failure) ([place])   . CKD (chronic kidney disease), stage IV ([place])   . Constipation   . Diabetes mellitus   . GERD (gastroesophageal reflux disease)   . Heart murmur, systolic   . History of hysterectomy   . Hyperlipidemia   . Hypertension   . Low back pain   . Obesity    . 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 Osteoarthritis   . Overactive bladder   . PVC's (premature ventricular contractions)     Patient Active Problem List   Diagnosis Date Noted  . Atrial fibrillation with RVR ([place]) 11/19/2016  . Abnormal nuclear stress test   . Left ventricular dysfunction   . Atrial fibrillation ([place]) 11/13/2016  . Anemia in chronic kidney disease   . Atrial fibrillation, new onset ([place]) 11/10/2016  . Hypoglycemia 11/10/2016  . Pleural effusion on right 10/31/2016  . HCAP (healthcare-associated pneumonia) 10/31/2016  . DM type 2 causing vascular disease ([place]) 10/05/2016  . Personal history of noncompliance with medical treatment, presenting hazards to health 08/22/2016  . Hyperglycemia 08/07/2016  . Pulmonary edema 07/14/2016  . CKD (chronic kidney disease), stage IV ([place]) 07/14/2016  . Acute respiratory failure with hypoxia ([place]) 07/09/2016  . Acute on chronic systolic CHF (congestive heart failure) ([place]) 12/01/2015  . AKI (acute kidney injury) ([place])   . Ischemia of upper extremity   . Right knee pain   . Subclavian artery stenosis, right ([place]) 07/07/2015  . Acute on chronic combined systolic and diastolic CHF (congestive heart failure) ([place]) 07/04/2015  . Elevated troponin 07/04/2015  . NSTEMI (non-ST elevated myocardial infarction) ([place]) 07/04/2015  . Elevated d-dimer 07/04/2015  . Acute respiratory failure with hypercapnia ([place])   .  Bilateral lower extremity edema 01/20/2012  . Type 2 diabetes mellitus with stage 4 chronic kidney disease, with Armella Stogner-term current use of insulin ([place]) 01/21/2011  . Overweight 07/20/2009  . Coronary artery disease 10/08/2008  . HEART MURMUR, SYSTOLIC 95/11/3265  . SHOULDER PAIN 02/05/2007  . Hyperlipemia 04/11/2006  . Anxiety state 04/11/2006  . SYNDROME, CARPAL TUNNEL 04/11/2006  . Essential hypertension 04/11/2006  . ALLERGIC  RHINITIS 04/11/2006  . GERD 04/11/2006  . Constipation 04/11/2006  . OVERACTIVE BLADDER 04/11/2006  . OSTEOARTHRITIS 04/11/2006  . LOW BACK PAIN 04/11/2006    Past Surgical History:  Procedure Laterality Date  . ABDOMINAL HYSTERECTOMY    . BACK SURGERY     multiple  . CARDIAC CATHETERIZATION  04/04/2006   Est EF of 60%  . CARDIAC CATHETERIZATION N/A 07/04/2015   Procedure: Left Heart Cath and Coronary Angiography;  Surgeon: Troy Sine, MD;  Location: [place] [facility];  Service: Cardiovascular;  Laterality: N/A;  . CARDIAC CATHETERIZATION N/A 07/04/2015   Procedure: Coronary Stent Intervention;  Surgeon: Troy Sine, MD;  Location: [place] [facility];  Service: Cardiovascular;  Laterality: N/A;  . CARDIAC CATHETERIZATION N/A 10/13/2015   Procedure: Left Heart Cath and Coronary Angiography;  Surgeon: Troy Sine, MD;  Location: [place] [facility];  Service: Cardiovascular;  Laterality: N/A;  . CARDIAC CATHETERIZATION N/A 12/02/2015   Procedure: Left Heart Cath and Coronary Angiography;  Surgeon: Peter M Martinique, MD;  Location: [place] [facility];  Service: Cardiovascular;  Laterality: N/A;  . CARPAL TUNNEL RELEASE    . CERVICAL BIOPSY     cervical lymph node biopsies  . COLONOSCOPY  May 2002   Dr. Irving Shows :Followup in 5 years, normal exam  . COLONOSCOPY  2008   Dr. Laural Golden: Very redundant colon with mild melanosis coli, splenic flexure polyp biopsy with acute complaint of benign colon polyp. Recommended ten-year followup  . CORONARY STENT PLACEMENT  04/11/2006   2 -- Taxus stents to the circumflex   . PERIPHERAL VASCULAR CATHETERIZATION Right 07/09/2015   Procedure: Upper Extremity Angiography;  Surgeon: Conrad Cantrall, MD;  Location: [place] [facility];  Service: Cardiovascular;  Laterality: Right;  . PERIPHERAL VASCULAR CATHETERIZATION Right 07/10/2015   Procedure: RIGHT SUBCLAVIAN ARTERY THROMBECTOMY;  Surgeon: Serafina Mitchell, MD;  Location: [facility];  Service: Vascular;   Laterality: Right;  . tendonitis     bilateral elbow  . TRIGGER FINGER RELEASE        Allergies Ace inhibitors; Amlodipine; and Codeine  Family History  Problem Relation Age of Onset  . Diabetes Father   . Hypertension Father   . Stroke Father   . Hypertension Brother   . Aneurysm Brother   . Diabetes Brother   . Colon cancer Neg Hx     Social History Social History  Substance Use Topics  . Smoking status: Never Smoker  . Smokeless tobacco: Never Used  . Alcohol use No    Review of Systems  Constitutional: No fever/chills. Positive generalized weakness and near-syncope with fall.  Eyes: No visual changes. ENT: No sore throat. Cardiovascular: Denies chest pain. Respiratory: Positive shortness of breath. Gastrointestinal: No abdominal pain.  No nausea, no vomiting.  No diarrhea.  No constipation. Genitourinary: Negative for dysuria. Musculoskeletal: Negative for back pain. Skin: Negative for rash. Neurological: Negative for headaches, focal weakness or numbness.  10-point ROS otherwise negative.  ____________________________________________   PHYSICAL EXAM:  VITAL SIGNS: ED Triage Vitals  Enc Vitals Group  BP 11/19/16 0756 100/80     Pulse Rate 11/19/16 0756 (!) 127     Resp 11/19/16 0756 20     Temp 11/19/16 0756 97.8 F (36.6 C)     Temp Source 11/19/16 0756 Oral     SpO2 11/19/16 0756 100 %     Weight 11/19/16 0749 208 lb (94.3 kg)     Height 11/19/16 0749 5\' 8"  (1.727 m)     Pain Score 11/19/16 0756 0   Constitutional: Alert and oriented. Well appearing and in no acute distress. Appears fatigued but provides full history and is awake.  Eyes: Conjunctivae are normal. Head: Atraumatic. Nose: No congestion/rhinnorhea. Mouth/Throat: Mucous membranes are dry.  Neck: No stridor.  Cardiovascular: A-fib. Good peripheral circulation. Grossly normal heart sounds.   Respiratory: Normal respiratory effort.  No retractions. Lungs CTAB. Gastrointestinal:  Soft and nontender. No distention.  Musculoskeletal: No lower extremity tenderness. 1 + pitting edema in bilateral LEs. No unilateral leg swelling. No gross deformities of extremities. Neurologic:  Normal speech and language. No gross focal neurologic deficits are appreciated.  Skin:  Skin is warm, dry and intact. No rash noted.   ____________________________________________   LABS (all labs ordered are listed, but only abnormal results are displayed)  Labs Reviewed  CBC WITH DIFFERENTIAL/PLATELET - Abnormal; Notable for the following:       Result Value   RBC 2.63 (*)    Hemoglobin 8.1 (*)    HCT 25.0 (*)    RDW 16.9 (*)    Lymphs Abs 0.4 (*)    All other components within normal limits  PROTIME-INR - Abnormal; Notable for the following:    Prothrombin Time 39.5 (*)    INR 4.10 (*)    All other components within normal limits  COMPREHENSIVE METABOLIC PANEL - Abnormal; Notable for the following:    Sodium 134 (*)    Chloride 96 (*)    Glucose, Bld 181 (*)    BUN 67 (*)    Creatinine, Ser 3.00 (*)    Albumin 2.9 (*)    AST 62 (*)    Alkaline Phosphatase 164 (*)    GFR calc non Af Amer 15 (*)    GFR calc Af Amer 17 (*)    All other components within normal limits  GLUCOSE, CAPILLARY - Abnormal; Notable for the following:    Glucose-Capillary 145 (*)    All other components within normal limits  I-STAT TROPONIN, ED - Abnormal; Notable for the following:    Troponin i, poc 0.10 (*)    All other components within normal limits  LIPASE, BLOOD   ____________________________________________  EKG   EKG Interpretation  Date/Time:  Saturday November 19 2016 08:01:03 EDT Ventricular Rate:  130 PR Interval:    QRS Duration: 105 QT Interval:  341 QTC Calculation: 502 R Axis:   26 Text Interpretation:  Atrial fibrillation Low voltage, extremity leads Borderline repolarization abnormality Prolonged QT interval No STEMI.  Confirmed by Nanda Quinton [phone]) on 11/19/2016 8:07:57  AM       ____________________________________________  RADIOLOGY  Dg Chest 2 View  Result Date: 11/19/2016 CLINICAL DATA:  Short of breath. Discharged 3 days ago with congestive heart failure. EXAM: CHEST  2 VIEW COMPARISON:  11/10/2016 FINDINGS: Degraded lateral view secondary to positioning and overlying artifact. Midline trachea. Normal heart size for level of inspiration. Increase in moderate right pleural effusion. No pneumothorax. Low lung volumes. Mild pulmonary venous congestion. Worsened right and developing mild left base airspace  disease. IMPRESSION: Increase in moderate right pleural effusion with adjacent atelectasis or infection. Mild pulmonary venous congestion with minimal left base atelectasis. Electronically Signed   By: Abigail Miyamoto M.D.   On: 11/19/2016 09:08   Ct Chest Wo Contrast  Result Date: 11/19/2016 CLINICAL DATA:  Shortness of breath. EXAM: CT CHEST WITHOUT CONTRAST TECHNIQUE: Multidetector CT imaging of the chest was performed following the standard protocol without IV contrast. COMPARISON:  Chest x-ray from same day. CT chest dated October 31, 2016. FINDINGS: Cardiovascular: Mild cardiomegaly, unchanged. New small pericardial effusion. Coronary, aortic arch, and branch vessel atherosclerotic vascular disease. Mediastinum/Nodes: Prominent and mildly enlarged mediastinal lymph nodes are stable to slightly decreased in size. No axillary lymphadenopathy. The thyroid gland, trachea, and esophagus are unremarkable. Lungs/Pleura: Large right and small left pleural effusions. Right greater than left bibasilar atelectasis. Additional atelectasis in the right middle lobe. No suspicious pulmonary nodules. No consolidation or pneumothorax. Upper Abdomen: No acute abnormality. Musculoskeletal: No chest wall mass or suspicious bone lesions identified. No fracture. Unchanged degenerative changes of the thoracic spine, worst at T7-T8 and T10-T11. IMPRESSION: 1. Large right and small left  pleural effusions with adjacent right greater than left atelectasis. 2. New small pericardial effusion. 3. Stable to slightly decreased in size mildly enlarged mediastinal lymph nodes, likely reactive. 4.  Aortic atherosclerosis (ICD10-I70.0). Electronically Signed   By: Titus Dubin M.D.   On: 11/19/2016 11:16   Dg Chest Right Decubitus  Result Date: 11/19/2016 CLINICAL DATA:  Right pleural effusion. EXAM: CHEST - RIGHT DECUBITUS COMPARISON:  Chest x-ray from earlier today and CT of the chest November 19, 2016 FINDINGS: The right-side-down decubitus film demonstrates a layering right-sided effusion which correlates with comparison imaging. IMPRESSION: The right-sided pleural effusion layers on this study. Electronically Signed   By: Dorise Bullion III M.D   On: 11/19/2016 15:29    ____________________________________________   PROCEDURES  Procedure(s) performed:   Procedures  Emergency Ultrasound Study:   Angiocath insertion Performed by: Margette Fast  Consent: Verbal consent obtained. Risks and benefits: risks, benefits and alternatives were discussed Immediately prior to procedure the correct patient, procedure, equipment, support staff and site/side marked as needed.  Indication: difficult IV access Preparation: Patient was prepped and draped in the usual sterile fashion. Vein Location: LEFT AC was visualized during assessment for potential access sites and was found to be patent/ easily compressed with linear ultrasound.  The needle was visualized with real-time ultrasound and guided into the vein. Gauge: 20   Normal blood return.  Patient tolerance: Patient tolerated the procedure well with no immediate complications.  ____________________________________________   INITIAL IMPRESSION / ASSESSMENT AND PLAN / ED COURSE  Pertinent labs & imaging results that were available during my care of the patient were reviewed by me and considered in my medical decision making (see  chart for details).  Patient resents to the emergency department for evaluation of worsening generalized weakness in the setting of recent hospitalization and poor PO intake with new onset SOB and near-syncope. Newly started on Coumadin but no history of blood in the stools. Patient mucous membranes and skin turgor suggest dehydration. Mild anemia at discharge on 11/16/16. No CP currently. Lung exam is largely unremarkable.   10:00 AM Spoke with Cardiology Dr. Bronson Ing who is familiar with the patient. Doubt seriously any primary CAD reason for elevated troponin. Agree with my suspicion that troponin is elevated 2/2 demand ischemia from worsening pleural effusion +/- developing PNA.   Discussed patient's case with  Hospitalist to request admission. Patient and family (if present) updated with plan. Care transferred to Hospitalist service.  I reviewed all nursing notes, vitals, pertinent old records, EKGs, labs, imaging (as available).  ____________________________________________  FINAL CLINICAL IMPRESSION(S) / ED DIAGNOSES  Final diagnoses:  Pleural effusion  Generalized weakness  Shortness of breath     MEDICATIONS GIVEN DURING THIS VISIT:  Medications  metoprolol succinate (TOPROL-XL) 24 hr tablet 75 mg (75 mg Oral Given 11/19/16 1314)  ticagrelor (BRILINTA) tablet 60 mg (not administered)  cyclobenzaprine (FLEXERIL) tablet 5 mg (not administered)  diazepam (VALIUM) tablet 5 mg (5 mg Oral Given 11/19/16 1314)  fluticasone (FLONASE) 50 MCG/ACT nasal spray 1 spray (1 spray Each Nare Given 11/19/16 1314)  torsemide (DEMADEX) tablet 60 mg (60 mg Oral Given 11/19/16 1314)  albuterol (PROVENTIL) (2.5 MG/3ML) 0.083% nebulizer solution 2.5 mg (not administered)  pantoprazole (PROTONIX) EC tablet 40 mg (40 mg Oral Given 11/19/16 1314)  ranolazine (RANEXA) 12 hr tablet 500 mg (not administered)  isosorbide mononitrate (IMDUR) 24 hr tablet 60 mg (60 mg Oral Given 11/19/16 1319)  rosuvastatin  (CRESTOR) tablet 40 mg (not administered)  insulin aspart (novoLOG) injection 0-9 Units (not administered)  acetaminophen (TYLENOL) tablet 650 mg (not administered)  ondansetron (ZOFRAN) injection 4 mg (not administered)  vancomycin (VANCOCIN) IVPB 1000 mg/200 mL premix (not administered)  ceFEPIme (MAXIPIME) 1 g in dextrose 5 % 50 mL IVPB (not administered)  insulin glargine (LANTUS) injection 30 Units (not administered)  sodium chloride 0.9 % bolus 500 mL (0 mLs Intravenous Stopped 11/19/16 1122)  ceFEPIme (MAXIPIME) 2 g in dextrose 5 % 50 mL IVPB (0 g Intravenous Stopped 11/19/16 1041)  vancomycin (VANCOCIN) 1,500 mg in sodium chloride 0.9 % 500 mL IVPB (0 mg Intravenous Stopped 11/19/16 1245)     NEW OUTPATIENT MEDICATIONS STARTED DURING THIS VISIT:  None   Note:  This document was prepared using Dragon voice recognition software and may include unintentional dictation errors.  Nanda Quinton, MD Emergency Medicine    Caterine Mcmeans, [facility], MD 11/19/16 1535

## 2016-11-19 NOTE — Progress Notes (Signed)
DR Allyson Sabal IS AWARE THAT PT CAN NOT HAVE US THORACENTESIS HERE AT [facility] UNTIL Monday 11/21/16. NO ORDER WRITTEN TO SEND PT TO [facility] D/T PT'S PT/INR BEING ELEVATED.

## 2016-11-20 ENCOUNTER — Inpatient Hospital Stay (HOSPITAL_COMMUNITY): Payer: BLUE CROSS/BLUE SHIELD

## 2016-11-20 DIAGNOSIS — K219 Gastro-esophageal reflux disease without esophagitis: Secondary | ICD-10-CM

## 2016-11-20 DIAGNOSIS — R531 Weakness: Secondary | ICD-10-CM

## 2016-11-20 LAB — PROTIME-INR
INR: 3.15
PROTHROMBIN TIME: 32.1 s — AB (ref 11.4–15.2)

## 2016-11-20 LAB — GLUCOSE, CAPILLARY
GLUCOSE-CAPILLARY: 130 mg/dL — AB (ref 65–99)
GLUCOSE-CAPILLARY: 139 mg/dL — AB (ref 65–99)
GLUCOSE-CAPILLARY: 273 mg/dL — AB (ref 65–99)
Glucose-Capillary: 128 mg/dL — ABNORMAL HIGH (ref 65–99)

## 2016-11-20 LAB — BASIC METABOLIC PANEL
Anion gap: 12 (ref 5–15)
BUN: 62 mg/dL — AB (ref 6–20)
CHLORIDE: 100 mmol/L — AB (ref 101–111)
CO2: 23 mmol/L (ref 22–32)
CREATININE: 2.8 mg/dL — AB (ref 0.44–1.00)
Calcium: 8.8 mg/dL — ABNORMAL LOW (ref 8.9–10.3)
GFR calc Af Amer: 18 mL/min — ABNORMAL LOW (ref >60)
GFR, EST NON AFRICAN AMERICAN: 16 mL/min — AB (ref >60)
Glucose, Bld: 146 mg/dL — ABNORMAL HIGH (ref 65–99)
Potassium: 4.2 mmol/L (ref 3.5–5.1)
Sodium: 135 mmol/L (ref 135–145)

## 2016-11-20 MED ORDER — HYDRALAZINE HCL 100 MG PO TABS: 25.0000 mg | ORAL_TABLET | Freq: Three times a day (TID) | ORAL | Status: DC

## 2016-11-20 MED ORDER — GUAIFENESIN-DM 100-10 MG/5ML PO SYRP
10.0000 mL | ORAL_SOLUTION | Freq: Four times a day (QID) | ORAL | Status: DC | PRN
  Administered 2016-11-20 – 2016-11-26 (×7): 10 mL via ORAL
  Filled 2016-11-20 (×8): qty 10

## 2016-11-20 MED ORDER — HYDRALAZINE HCL 25 MG PO TABS
25.0000 mg | ORAL_TABLET | Freq: Three times a day (TID) | ORAL | Status: DC
  Administered 2016-11-20 – 2016-11-27 (×20): 25 mg via ORAL
  Filled 2016-11-20 (×20): qty 1

## 2016-11-20 MED ORDER — GUAIFENESIN-DM 100-10 MG/5ML PO SYRP: 5.0000 mL | ORAL_SOLUTION | ORAL | Status: DC | PRN

## 2016-11-20 MED ORDER — WARFARIN - PHARMACIST DOSING INPATIENT: Freq: Every day | Status: DC

## 2016-11-20 MED ORDER — ALPRAZOLAM 0.5 MG PO TABS
0.5000 mg | ORAL_TABLET | Freq: Every evening | ORAL | Status: DC | PRN
  Administered 2016-11-20 – 2016-11-26 (×4): 0.5 mg via ORAL
  Filled 2016-11-20 (×4): qty 1

## 2016-11-20 NOTE — Progress Notes (Signed)
ANTICOAGULATION CONSULT NOTE - follow up  Pharmacy Consult for coumadin Indication: atrial fibrillation  Allergies  Allergen Reactions  . Ace Inhibitors Cough  . Amlodipine     Edema   . Codeine Rash    Patient Measurements: Height: 5\' 8"  (172.7 cm) Weight: 212 lb 15.4 oz (96.6 kg) IBW/kg (Calculated) : 63.9  Vital Signs: Temp: 98.1 F (36.7 C) (09/16 1133) Temp Source: Oral (09/16 1133) BP: 142/75 (09/16 0900)  Labs:  Recent Labs  11/19/16 0815 11/20/16 0426 11/20/16 1018  HGB 8.1*  --   --   HCT 25.0*  --   --   PLT 372  --   --   LABPROT 39.5* 32.1*  --   INR 4.10* 3.15  --   CREATININE 3.00*  --  2.80*    Estimated Creatinine Clearance: 22.4 mL/min (A) (by C-G formula based on SCr of 2.8 mg/dL (H)).   Medical History: Past Medical History:  Diagnosis Date  . Acute renal failure superimposed on stage 4 chronic kidney disease ([place]) 07/04/2015  . Allergic rhinitis   . Anemia   . Anxiety   . CAD in native artery    NSTEMI 07/2015 s/p DES to RCA and posterior PDA, PCI 10/2015 with scoring balloon to 85% ISR of distal RCA), known LAD/Cx disease treated medically  . Carotid artery disease ([place])    Mild bilateral carotid disease (1-39% 07/2015)  . Chronic combined systolic and diastolic CHF (congestive heart failure) ([place])   . CKD (chronic kidney disease), stage IV ([place])   . Constipation   . Diabetes mellitus   . GERD (gastroesophageal reflux disease)   . Heart murmur, systolic   . History of hysterectomy   . Hyperlipidemia   . Hypertension   . Low back pain   . Obesity   . 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 Osteoarthritis   . Overactive bladder   . PVC's (premature ventricular contractions)     Medications:  Prescriptions Prior to Admission  Medication Sig Dispense Refill Last Dose  . acetaminophen (TYLENOL) 500 MG tablet Take 2 tablets (1,000 mg total) by  mouth every 6 (six) hours as needed (pain). 30 tablet 0 11/18/2016 at Unknown time  . albuterol (PROVENTIL HFA;VENTOLIN HFA) 108 (90 Base) MCG/ACT inhaler Inhale 1-2 puffs into the lungs every 6 (six) hours as needed for wheezing or shortness of breath. 1 Inhaler 0 11/19/2016 at Unknown time  . albuterol (PROVENTIL) (2.5 MG/3ML) 0.083% nebulizer solution Take 3 mLs (2.5 mg total) by nebulization every 6 (six) hours as needed for wheezing or shortness of breath. 75 mL 1 11/19/2016 at Unknown time  . cefUROXime (CEFTIN) 500 MG tablet Take 1 tablet (500 mg total) by mouth daily with breakfast. 5 tablet 0 11/18/2016 at Unknown time  . cyclobenzaprine (FLEXERIL) 5 MG tablet Take 5 mg by mouth at bedtime.  0 Past Week at Unknown time  . diazepam (VALIUM) 5 MG tablet Take 1 tablet (5 mg total) by mouth every 12 (twelve) hours as needed for muscle spasms. 10 tablet 0 Past Week at Unknown time  . ferrous sulfate 325 (65 FE) MG tablet Take 325 mg by mouth daily with breakfast.    11/18/2016 at Unknown time  . fluticasone (FLONASE) 50 MCG/ACT nasal spray Place 1 spray into both nostrils daily.   11/18/2016 at Unknown time  . hydrALAZINE (APRESOLINE) 100 MG tablet Take 1 tablet (100 mg total)  by mouth 3 (three) times daily. 120 tablet 1 11/18/2016 at Unknown time  . insulin glargine (LANTUS) 100 UNIT/ML injection Inject 40 Units into the skin at bedtime.   11/18/2016 at Unknown time  . insulin lispro (HUMALOG KWIKPEN) 100 UNIT/ML KiwkPen INJECT 18 TO 24 UNITS TOTAL INTO THE SKIN THREE TIMES DAILY 15 mL 2 11/18/2016 at Unknown time  . isosorbide mononitrate (IMDUR) 60 MG 24 hr tablet Take 1 tablet (60 mg total) by mouth daily. 30 tablet 0 11/18/2016 at Unknown time  . latanoprost (XALATAN) 0.005 % ophthalmic solution Place 1 drop into both eyes at bedtime.    11/18/2016 at Unknown time  . meclizine (ANTIVERT) 25 MG tablet Take 12.5 mg by mouth 2 (two) times daily.    11/18/2016 at Unknown time  . metoprolol succinate  (TOPROL-XL) 25 MG 24 hr tablet Take 3 tablets (75 mg total) by mouth daily. Take with or immediately following a meal. 30 tablet 2 11/18/2016 at 2200  . Multiple Vitamin (MULTIVITAMIN WITH MINERALS) TABS tablet Take 1 tablet by mouth daily.   Past Week at Unknown time  . nitroGLYCERIN (NITROSTAT) 0.4 MG SL tablet Place 1 tablet (0.4 mg total) under the tongue every 5 (five) minutes as needed for chest pain. 25 tablet 2 unknown at unknown  . pantoprazole (PROTONIX) 40 MG tablet Take 40 mg by mouth daily.    11/18/2016 at Unknown time  . potassium chloride SA (K-DUR,KLOR-CON) 20 MEQ tablet Take 2 tablets (40 mEq total) by mouth daily. 60 tablet 5 11/18/2016 at Unknown time  . RANEXA 500 MG 12 hr tablet TAKE ONE TABLET BY MOUTH TWICE DAILY 60 tablet 5 11/18/2016 at Unknown time  . rosuvastatin (CRESTOR) 40 MG tablet Take 1 tablet (40 mg total) by mouth at bedtime. 30 tablet 0 11/18/2016 at Unknown time  . ticagrelor (BRILINTA) 60 MG TABS tablet Take 1 tablet (60 mg total) by mouth 2 (two) times daily. 60 tablet 1 11/18/2016 at 2200  . torsemide (DEMADEX) 20 MG tablet Take 60 mg by mouth 2 (two) times daily. Pt is able to take an additional tablet daily for weight gain greater than 3lbs.   11/18/2016 at Unknown time  . trolamine salicylate (ASPERCREME) 10 % cream Apply 1 application topically as needed for muscle pain.   Past Week at Unknown time  . warfarin (COUMADIN) 7.5 MG tablet Take 1 tablet (7.5 mg total) by mouth once. 45 tablet 2     Assessment: 71 yo female with significant cardiac history including CHF, CAD s/p PCI 2018; CKD stage 4; now with new onset Afib (nonvalvular).  Pt was initially started on rivaroxaban but due to renal fxn was switched to WARFARIN on 9/10. INR remains slightly  elevated at 3.15  Goal of Therapy:  INR 2-3 Monitor platelets by anticoagulation protocol: Yes   Plan:  No coumadin today PT-INR daily Monitor for S/S of bleeding  Isac Sarna, BS [place], BCPS Clinical  Pharmacist Pager [phone] 11/20/2016,2:04 PM

## 2016-11-20 NOTE — Progress Notes (Addendum)
[facility] PROGRESS NOTE  [name] BPZ:[id number] DOB: [birth year] DOA: 11/19/2016   PCP: [name], MD     Assessment/Plan: Active Problems:   Hyperlipemia   Essential hypertension   Coronary artery disease   GERD   Type 2 diabetes mellitus with stage 4 chronic kidney disease, with long-term current use of insulin (HCC)   Acute on chronic combined systolic and diastolic CHF (congestive heart failure) (HCC)   DM type 2 causing vascular disease (HCC)   Pleural effusion on right   Atrial fibrillation with RVR (HCC)   Generalized weakness   71 y.o.femalewith a hx of CAD with stents to the mid LAD, CTO of the left Cx, with non-obstructive RCA stenosis, combined systolic and diastolic heart failure, 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 Patient was optimized and discharged home on 9/12 with a new regimen of medications for heart failure. She felt fine for a few hours and then started developing lack of appetite. She states that she has not eaten anything since substantial since her discharge. She feels weak and dehydrated. She has been taking her diuretic regimen. She also noticed shortness of breath this morning with minimal exertion. She also noticed worsening dependent edema. No fever she has a mucopurulent cough with tinge of blood. No significant consolidation or pneumothorax. Creatinine 3 up from 2.8. Patient admitted for atrial fibrillation with RVR as well as dyspnea in the setting of worsening renal function.    Assessment/Plan     atrial fibrillation with RVR, RVR has resolved Heart rate has improved since admission, continue metoprolol   TSH was normal previous admission - CHA2DS2 VASc score is 5 - started on anticoagulation with Coumadin by cardiologylast admission,  continue Brilinta to 60 mg bid . INR supratherapeutic , Coumadin will be dosed by pharmacy  Shortness of breath Multifactorial secondary to worsening right pleural effusion/occult pneumonia/bronchitis/atrial fibrillation with rapid ventricular response. Treat underlying causes  Right-sided pleural effusion Ordered ultrasound-guided thoracentesis with interventional radiology for 9/17 , likely transudative due to CHF, this would be her second thoracentesis    Chronic combined systolic and diastolic CHF- acute worsening  TTE this admit notes EF 30-35% w/ grade 2 DD -diuresed with lasix 80 mg IV BID previous admission, hold diuretics today given increase in BUN and creatinine in the setting of decreased oral intake. Will resume torsemide at 60 mg a day . Continue- Metoprolol   75 mg daily and hydralazine for more optimal BP control . Avoid ACE/ARB due to renal fn   Acute kidney injury on chronic kidney disease stage II Baseline creatinine 2.0 - did not improve with volume expansion previously-cr is now close to 3,  perhaps she suffered a period of poor perfusion in setting of Afib w/ RVR /dehydration due to poor oral intake - held diuretics on admission a,now  resume torsemide at a lower dose      CAD w/ stents in mid RCA and distal RCA/PDA Last cardiac cath Sept 2017 -   EF has worsened - Ranexa stopped in setting of worsening renal fxn  Lexiscan Myoview stress test on 11/15/16. nuclear stress test demonstrative of a large area of scar with no ischemia coronary angiography given her CKD stage IV and high likelihood for development of contrast-mediated nephropathy and need for dialysis  Subclavian artery occlusion S/p  thromboembolectomy - continue aspirin and Brillinta  Anemia of chronic disease No evidence of acute blood loss - likely due to chronic kidney disease - follow trend   E coli UTI  Completed treatment  DM 2 Poorly controlled - A1c 8.8  Continue Lantus and  sliding scale insulin  Generalized anxiety disorder Appears well compensated at present     DVT prophylaxsis coumadin  Code Status:  Full      Family Communication: Discussed in detail with the patient, all imaging results, lab results explained to the patient   Disposition Plan:  2-3 days       Consultants:  None   Procedures:  None   Antibiotics: Anti-infectives    Start     Dose/Rate Route Frequency Ordered Stop   11/20/16 1100  vancomycin (VANCOCIN) IVPB 1000 mg/200 mL premix     1,000 mg 200 mL/hr over 60 Minutes Intravenous Every 24 hours 11/19/16 1211     11/20/16 1000  ceFEPIme (MAXIPIME) 1 g in dextrose 5 % 50 mL IVPB     1 g 100 mL/hr over 30 Minutes Intravenous Every 24 hours 11/19/16 1211     11/20/16 0000  vancomycin (VANCOCIN) 1,500 mg in sodium chloride 0.9 % 500 mL IVPB  Status:  Discontinued     1,500 mg 250 mL/hr over 120 Minutes Intravenous Every 12 hours 11/19/16 1205 11/19/16 1211   11/19/16 1215  ceFEPIme (MAXIPIME) 2 g in dextrose 5 % 50 mL IVPB  Status:  Discontinued     2 g 100 mL/hr over 30 Minutes Intravenous  Once 11/19/16 1205 11/19/16 1211   11/19/16 1015  vancomycin (VANCOCIN) 1,500 mg in sodium chloride 0.9 % 500 mL IVPB     1,500 mg 250 mL/hr over 120 Minutes Intravenous  Once 11/19/16 1000 11/19/16 1245   11/19/16 0945  ceFEPIme (MAXIPIME) 2 g in dextrose 5 % 50 mL IVPB     2 g 100 mL/hr over 30 Minutes Intravenous  Once 11/19/16 0944 11/19/16 1041   11/19/16 0945  vancomycin (VANCOCIN) IVPB 1000 mg/200 mL premix  Status:  Discontinued     1,000 mg 200 mL/hr over 60 Minutes Intravenous  Once 11/19/16 0944 11/19/16 1000         HPI/Subjective: Placed on oxygen last  Night , no tachycardia, cp , slightly sob   Objective: Vitals:   11/20/16 0700 11/20/16 0726 11/20/16 0800 11/20/16 0900  BP: (!) 157/88  (!) 167/78 (!) 142/75  Pulse:      Resp: 17 (!) 25 19 20   Temp:  97.6 F (36.4 C)    TempSrc:  Axillary    SpO2:  99% 100% 100% 100%  Weight:      Height:        Intake/Output Summary (Last 24 hours) at 11/20/16 1026 Last data filed at 11/20/16 0903  Gross per 24 hour  Intake             1430 ml  Output             1000 ml  Net              430 ml    Exam:  Examination:  General exam: Appears calm and comfortable  Respiratory system: Clear to auscultation. Respiratory effort normal. Cardiovascular system: S1 & S2 heard, RRR. No JVD, murmurs, rubs, gallops or clicks. No pedal edema. Gastrointestinal system: Abdomen is nondistended, soft and nontender. No organomegaly or masses felt. Normal bowel sounds heard. Central nervous  system: Alert and oriented. No focal neurological deficits. Extremities: Symmetric 5 x 5 power. Skin: No rashes, lesions or ulcers Psychiatry: Judgement and insight appear normal. Mood & affect appropriate.     Data Reviewed: I have personally reviewed following labs and imaging studies  Micro Results Recent Results (from the past 240 hour(s))  Urine culture     Status: Abnormal   Collection Time: 11/10/16  8:55 PM  Result Value Ref Range Status   Specimen Description URINE, CLEAN CATCH  Final   Special Requests NONE  Final   Culture 50,000 COLONIES/mL ESCHERICHIA COLI (A)  Final   Report Status 11/13/2016 FINAL  Final   Organism ID, Bacteria ESCHERICHIA COLI (A)  Final      Susceptibility   Escherichia coli - MIC*    AMPICILLIN >=32 RESISTANT Resistant     CEFAZOLIN <=4 SENSITIVE Sensitive     CEFTRIAXONE <=1 SENSITIVE Sensitive     CIPROFLOXACIN >=4 RESISTANT Resistant     GENTAMICIN <=1 SENSITIVE Sensitive     IMIPENEM <=0.25 SENSITIVE Sensitive     NITROFURANTOIN <=16 SENSITIVE Sensitive     TRIMETH/SULFA >=320 RESISTANT Resistant     AMPICILLIN/SULBACTAM >=32 RESISTANT Resistant     PIP/TAZO 64 INTERMEDIATE Intermediate     Extended ESBL NEGATIVE Sensitive     * 50,000 COLONIES/mL ESCHERICHIA COLI  MRSA PCR Screening     Status: None   Collection  Time: 11/11/16  8:02 AM  Result Value Ref Range Status   MRSA by PCR NEGATIVE NEGATIVE Final    Comment:        The GeneXpert MRSA Assay (FDA approved for NASAL specimens only), is one component of a comprehensive MRSA colonization surveillance program. It is not intended to diagnose MRSA infection nor to guide or monitor treatment for MRSA infections.     Radiology Reports Dg Chest 1 View  Result Date: 10/31/2016 CLINICAL DATA:  Status post right thoracentesis. EXAM: CHEST 1 VIEW COMPARISON:  10/31/2016. FINDINGS: Stable enlarged cardiac silhouette. Minimal left lower lobe atelectasis with improvement. Minimal bilateral pleural fluid, mildly improved on the left and significantly improved on the right following thoracentesis. No pneumothorax. Thoracic spine degenerative changes. IMPRESSION: 1. Almost completely resolved right pleural fluid following thoracentesis without pneumothorax. 2. Small left pleural effusion with improvement with decreased left basilar atelectasis. Electronically Signed   By: Claudie Revering M.D.   On: 10/31/2016 13:30   Dg Chest 2 View  Result Date: 11/19/2016 CLINICAL DATA:  Short of breath. Discharged 3 days ago with congestive heart failure. EXAM: CHEST  2 VIEW COMPARISON:  11/10/2016 FINDINGS: Degraded lateral view secondary to positioning and overlying artifact. Midline trachea. Normal heart size for level of inspiration. Increase in moderate right pleural effusion. No pneumothorax. Low lung volumes. Mild pulmonary venous congestion. Worsened right and developing mild left base airspace disease. IMPRESSION: Increase in moderate right pleural effusion with adjacent atelectasis or infection. Mild pulmonary venous congestion with minimal left base atelectasis. Electronically Signed   By: Abigail Miyamoto M.D.   On: 11/19/2016 09:08   Dg Chest 2 View  Result Date: 10/31/2016 CLINICAL DATA:  Chest pain and shortness of breath.  Cough. EXAM: CHEST  2 VIEW COMPARISON:   08/07/2016 FINDINGS: There is cardiomegaly. Bilateral pleural effusions, moderate on the right and small on the left. There is vascular congestion and peribronchial cuffing suggesting pulmonary edema. No pneumothorax. IMPRESSION: Cardiomegaly with bilateral pleural effusions and vascular congestion. Peribronchial cuffing may reflect pulmonary edema or bronchial inflammation. Electronically Signed  By: Jeb Levering M.D.   On: 10/31/2016 06:11   Ct Soft Tissue Neck Wo Contrast  Result Date: 10/29/2016 CLINICAL DATA:  Left-sided neck pain after doing exercises in cardiac rehab last week. EXAM: CT NECK WITHOUT CONTRAST TECHNIQUE: Multidetector CT imaging of the neck was performed following the standard protocol without intravenous contrast. COMPARISON:  None. FINDINGS: Pharynx and larynx: No noted mass or inflammation. Limited at the level of the larynx due to patient motion. Salivary glands: No inflammation, mass, or stone. Thyroid: Normal. Lymph nodes: No enlargement or abnormal density of cervical lymph nodes. Vascular: Limited without contrast. Moderate calcified plaque at the carotid bifurcations. Limited intracranial: Negative Visualized orbits: Partly seen. No acute finding. Right cataract resection. Mastoids and visualized paranasal sinuses: Lobulated soft tissue in the left maxillary could be polyp or lobulated retention cyst. No destructive changes in the neighboring bone. Skeleton: Negative for acute or destructive finding. There are calcified disc bulges from C2-3 to C6-7, with ligamentum flavum thickening and calcification at C4-5. These cause cord deformity at C3-4 and C4-5. Patient has undergone laminectomy at C5-6. Upper chest: Left peritracheal soft tissue is likely multiple borderline enlarged lymph nodes rather than 1 large node or nodal conglomerate. These could be congestive given layering pleural effusions, small where seen. IMPRESSION: 1. Limited by patient motion and lack of IV contrast.  No specific explanation for acute neck pain. 2. Advanced cervical spine degeneration with calcified disc bulges and ligamentum flavum thickening. 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 3. Layering pleural effusions, small where seen. 4. Polypoid soft tissue in the left maxillary sinus, nonspecific without contrast. Electronically Signed   By: Monte Fantasia M.D.   On: 10/29/2016 18:39   Ct Chest Wo Contrast  Result Date: 11/19/2016 CLINICAL DATA:  Shortness of breath. EXAM: CT CHEST WITHOUT CONTRAST TECHNIQUE: Multidetector CT imaging of the chest was performed following the standard protocol without IV contrast. COMPARISON:  Chest x-ray from same day. CT chest dated October 31, 2016. FINDINGS: Cardiovascular: Mild cardiomegaly, unchanged. New small pericardial effusion. Coronary, aortic arch, and branch vessel atherosclerotic vascular disease. Mediastinum/Nodes: Prominent and mildly enlarged mediastinal lymph nodes are stable to slightly decreased in size. No axillary lymphadenopathy. The thyroid gland, trachea, and esophagus are unremarkable. Lungs/Pleura: Large right and small left pleural effusions. Right greater than left bibasilar atelectasis. Additional atelectasis in the right middle lobe. No suspicious pulmonary nodules. No consolidation or pneumothorax. Upper Abdomen: No acute abnormality. Musculoskeletal: No chest wall mass or suspicious bone lesions identified. No fracture. Unchanged degenerative changes of the thoracic spine, worst at T7-T8 and T10-T11. IMPRESSION: 1. Large right and small left pleural effusions with adjacent right greater than left atelectasis. 2. New small pericardial effusion. 3. Stable to slightly decreased in size mildly enlarged mediastinal lymph nodes, likely reactive. 4.  Aortic atherosclerosis (ICD10-I70.0). Electronically Signed   By: Titus Dubin M.D.   On: 11/19/2016 11:16   Ct Chest Wo Contrast  Result Date:  10/31/2016 CLINICAL DATA:  Shortness of breath with exertion EXAM: CT CHEST WITHOUT CONTRAST TECHNIQUE: Multidetector CT imaging of the chest was performed following the standard protocol without IV contrast. COMPARISON:  Chest x-ray 10/31/2016 FINDINGS: Cardiovascular: Aortic and coronary artery calcifications. No aneurysm. Mild cardiomegaly. Mediastinum/Nodes: Scattered borderline and mildly enlarged mediastinal lymph nodes. Index AP window lymph node has a short axis diameter of 18 mm. Index right paratracheal node has a short axis diameter of 9 mm. No axillary adenopathy. Lungs/Pleura:  Large right pleural effusion and small left pleural effusion. Bibasilar atelectasis. Mild vascular congestion. Upper Abdomen: Imaging into the upper abdomen shows no acute findings. Musculoskeletal: Chest wall soft tissues are unremarkable. No acute bony abnormality. IMPRESSION: Large right pleural effusion and small left effusion. Bibasilar atelectasis. Vascular congestion. Extensive coronary artery calcifications. Mildly enlarged mediastinal lymph nodes, likely reactive. Aortic Atherosclerosis (ICD10-I70.0). Electronically Signed   By: Rolm Baptise M.D.   On: 10/31/2016 07:08   Dg Chest Right Decubitus  Result Date: 11/19/2016 CLINICAL DATA:  Right pleural effusion. EXAM: CHEST - RIGHT DECUBITUS COMPARISON:  Chest x-ray from earlier today and CT of the chest November 19, 2016 FINDINGS: The right-side-down decubitus film demonstrates a layering right-sided effusion which correlates with comparison imaging. IMPRESSION: The right-sided pleural effusion layers on this study. Electronically Signed   By: Dorise Bullion III M.D   On: 11/19/2016 15:29   Nm Myocar Multi W/spect Tamela Oddi Motion / Ef  Result Date: 11/15/2016  There was no ST segment deviation noted during stress. T wave inversions inferiorly.  Defect 1: There is a large defect of severe severity present in the basal inferolateral, basal anterolateral, mid  anterior, mid anteroseptal, mid inferolateral, mid anterolateral, apical anterior, apical inferior, apical lateral and apex location.  This is a high risk study.  Findings consistent with prior myocardial infarction. No ischemic territories.  Nuclear stress EF: 21%.    Dg Chest Port 1 View  Result Date: 11/10/2016 CLINICAL DATA:  Shortness of breath tonight EXAM: PORTABLE CHEST 1 VIEW COMPARISON:  October 31, 2016 FINDINGS: The mediastinal contour is normal. The heart size is mildly enlarged. There is a small right pleural effusion with patchy consolidation of right lung base. The left lung is clear. No acute abnormalities identified in the visualized bones. IMPRESSION: Small right pleural effusion with patchy consolidation of right lung base, underline pneumonia is not excluded. Electronically Signed   By: Abelardo Diesel M.D.   On: 11/10/2016 21:28   US Thoracentesis Asp Pleural Space W/img Guide  Result Date: 10/31/2016 INDICATION: Right pleural effusion. EXAM: ULTRASOUND GUIDED RIGHT THORACENTESIS MEDICATIONS: None. COMPLICATIONS: None immediate. PROCEDURE: An ultrasound guided thoracentesis was thoroughly discussed with the patient and questions answered. The benefits, risks, alternatives and complications were also discussed. The patient understands and wishes to proceed with the procedure. Written consent was obtained. Ultrasound was performed to localize and mark an adequate pocket of fluid in the right chest. The area was then prepped and draped in the normal sterile fashion. 1% Lidocaine was used for local anesthesia. Under ultrasound guidance a Yuen catheter was introduced. Thoracentesis was performed. The catheter was removed and a dressing applied. FINDINGS: A total of approximately 1 L of blood-tinged fluid was removed. Samples were sent to the laboratory as requested by the clinical team. IMPRESSION: Successful ultrasound guided right thoracentesis yielding 1 L of pleural fluid. The patient  tolerated the procedure well. No immediate complications. Electronically Signed   By: Lorriane Shire M.D.   On: 10/31/2016 13:09     CBC  Recent Labs Lab 11/14/16 0409 11/16/16 0613 11/19/16 0815  WBC 6.5 8.1 7.6  HGB 8.9* 8.8* 8.1*  HCT 26.5* 27.7* 25.0*  PLT 344 354 372  MCV 96.4 96.2 95.1  MCH 32.4 30.6 30.8  MCHC 33.6 31.8 32.4  RDW 16.6* 16.8* 16.9*  LYMPHSABS  --   --  0.4*  MONOABS  --   --  0.7  EOSABS  --   --  0.0  BASOSABS  --   --  0.0    Chemistries   Recent Labs Lab 11/14/16 0409 11/15/16 0609 11/16/16 0613 11/19/16 0815  NA 134* 134* 134* 134*  K 4.2 3.3* 3.9 4.3  CL 97* 96* 98* 96*  CO2 25 24 24 25   GLUCOSE 176* 127* 182* 181*  BUN 71* 65* 62* 67*  CREATININE 2.95* 2.87* 2.81* 3.00*  CALCIUM 9.3 9.1 9.1 9.0  MG  --  2.3  --   --   AST  --  51*  --  62*  ALT  --  54  --  54  ALKPHOS  --  169*  --  164*  BILITOT  --  0.6  --  0.4   ------------------------------------------------------------------------------------------------------------------ estimated creatinine clearance is 20.9 mL/min (A) (by C-G formula based on SCr of 3 mg/dL (H)). ------------------------------------------------------------------------------------------------------------------ No results for input(s): HGBA1C in the last 72 hours. ------------------------------------------------------------------------------------------------------------------ No results for input(s): CHOL, HDL, LDLCALC, TRIG, CHOLHDL, LDLDIRECT in the last 72 hours. ------------------------------------------------------------------------------------------------------------------ No results for input(s): TSH, T4TOTAL, T3FREE, THYROIDAB in the last 72 hours.  Invalid input(s): FREET3 ------------------------------------------------------------------------------------------------------------------ No results for input(s): VITAMINB12, FOLATE, FERRITIN, TIBC, IRON, RETICCTPCT in the last 72  hours.  Coagulation profile  Recent Labs Lab 11/15/16 0602 11/16/16 0613 11/19/16 0815  INR 1.59 1.48 4.10*    No results for input(s): DDIMER in the last 72 hours.  Cardiac Enzymes No results for input(s): CKMB, TROPONINI, MYOGLOBIN in the last 168 hours.  Invalid input(s): CK ------------------------------------------------------------------------------------------------------------------ Invalid input(s): POCBNP   CBG:  Recent Labs Lab 11/16/16 1625 11/19/16 1155 11/19/16 1632 11/19/16 2122 11/20/16 0725  GLUCAP 224* 145* 139* 219* 130*       Studies: Dg Chest 2 View  Result Date: 11/19/2016 CLINICAL DATA:  Short of breath. Discharged 3 days ago with congestive heart failure. EXAM: CHEST  2 VIEW COMPARISON:  11/10/2016 FINDINGS: Degraded lateral view secondary to positioning and overlying artifact. Midline trachea. Normal heart size for level of inspiration. Increase in moderate right pleural effusion. No pneumothorax. Low lung volumes. Mild pulmonary venous congestion. Worsened right and developing mild left base airspace disease. IMPRESSION: Increase in moderate right pleural effusion with adjacent atelectasis or infection. Mild pulmonary venous congestion with minimal left base atelectasis. Electronically Signed   By: Abigail Miyamoto M.D.   On: 11/19/2016 09:08   Ct Chest Wo Contrast  Result Date: 11/19/2016 CLINICAL DATA:  Shortness of breath. EXAM: CT CHEST WITHOUT CONTRAST TECHNIQUE: Multidetector CT imaging of the chest was performed following the standard protocol without IV contrast. COMPARISON:  Chest x-ray from same day. CT chest dated October 31, 2016. FINDINGS: Cardiovascular: Mild cardiomegaly, unchanged. New small pericardial effusion. Coronary, aortic arch, and branch vessel atherosclerotic vascular disease. Mediastinum/Nodes: Prominent and mildly enlarged mediastinal lymph nodes are stable to slightly decreased in size. No axillary lymphadenopathy. The  thyroid gland, trachea, and esophagus are unremarkable. Lungs/Pleura: Large right and small left pleural effusions. Right greater than left bibasilar atelectasis. Additional atelectasis in the right middle lobe. No suspicious pulmonary nodules. No consolidation or pneumothorax. Upper Abdomen: No acute abnormality. Musculoskeletal: No chest wall mass or suspicious bone lesions identified. No fracture. Unchanged degenerative changes of the thoracic spine, worst at T7-T8 and T10-T11. IMPRESSION: 1. Large right and small left pleural effusions with adjacent right greater than left atelectasis. 2. New small pericardial effusion. 3. Stable to slightly decreased in size mildly enlarged mediastinal lymph nodes, likely reactive. 4.  Aortic atherosclerosis (ICD10-I70.0). Electronically Signed   By: Titus Dubin M.D.   On: 11/19/2016 11:16  Dg Chest Right Decubitus  Result Date: 11/19/2016 CLINICAL DATA:  Right pleural effusion. EXAM: CHEST - RIGHT DECUBITUS COMPARISON:  Chest x-ray from earlier today and CT of the chest November 19, 2016 FINDINGS: The right-side-down decubitus film demonstrates a layering right-sided effusion which correlates with comparison imaging. IMPRESSION: The right-sided pleural effusion layers on this study. Electronically Signed   By: Dorise Bullion III M.D   On: 11/19/2016 15:29      Lab Results  Component Value Date   HGBA1C 8.8 (H) 10/31/2016   HGBA1C 9.6 09/28/2016   HGBA1C 10.3 (H) 07/09/2016   Lab Results  Component Value Date   LDLCALC 96 07/07/2015   CREATININE 3.00 (H) 11/19/2016       Scheduled Meds: . cyclobenzaprine  5 mg Oral QHS  . fluticasone  1 spray Each Nare Daily  . insulin aspart  0-9 Units Subcutaneous TID WC  . insulin glargine  30 Units Subcutaneous QHS  . isosorbide mononitrate  60 mg Oral Daily  . metoprolol succinate  75 mg Oral Q breakfast  . pantoprazole  40 mg Oral Daily  . ranolazine  500 mg Oral BID  . rosuvastatin  40 mg Oral QHS   . ticagrelor  60 mg Oral BID  . torsemide  60 mg Oral Daily  . Warfarin - Pharmacist Dosing Inpatient   Does not apply q1800   Continuous Infusions: . ceFEPime (MAXIPIME) IV 1 g (11/20/16 0903)  . vancomycin       LOS: 1 day    Time spent: >30 MINS    Reyne Dumas  [facility] Pager [phone]. If 7PM-7AM, please contact night-coverage at [email or web address], password [facility] 11/20/2016, 10:26 AM  LOS: 1 day

## 2016-11-21 ENCOUNTER — Inpatient Hospital Stay (HOSPITAL_COMMUNITY): Payer: BLUE CROSS/BLUE SHIELD

## 2016-11-21 DIAGNOSIS — N179 Acute kidney failure, unspecified: Secondary | ICD-10-CM

## 2016-11-21 LAB — COMPREHENSIVE METABOLIC PANEL
ALT: 40 U/L (ref 14–54)
AST: 40 U/L (ref 15–41)
Albumin: 2.6 g/dL — ABNORMAL LOW (ref 3.5–5.0)
Alkaline Phosphatase: 153 U/L — ABNORMAL HIGH (ref 38–126)
Anion gap: 11 (ref 5–15)
BUN: 60 mg/dL — ABNORMAL HIGH (ref 6–20)
CALCIUM: 8.6 mg/dL — AB (ref 8.9–10.3)
CHLORIDE: 101 mmol/L (ref 101–111)
CO2: 26 mmol/L (ref 22–32)
CREATININE: 2.6 mg/dL — AB (ref 0.44–1.00)
GFR calc non Af Amer: 17 mL/min — ABNORMAL LOW (ref >60)
GFR, EST AFRICAN AMERICAN: 20 mL/min — AB (ref >60)
Glucose, Bld: 167 mg/dL — ABNORMAL HIGH (ref 65–99)
Potassium: 3.9 mmol/L (ref 3.5–5.1)
SODIUM: 138 mmol/L (ref 135–145)
Total Bilirubin: 0.5 mg/dL (ref 0.3–1.2)
Total Protein: 6.8 g/dL (ref 6.5–8.1)

## 2016-11-21 LAB — BODY FLUID CELL COUNT WITH DIFFERENTIAL
Eos, Fluid: 0 %
Lymphs, Fluid: 11 %
Monocyte-Macrophage-Serous Fluid: 47 % — ABNORMAL LOW (ref 50–90)
Neutrophil Count, Fluid: 42 % — ABNORMAL HIGH (ref 0–25)
OTHER CELLS FL: 0 %
Total Nucleated Cell Count, Fluid: 525 cu mm (ref 0–1000)

## 2016-11-21 LAB — PROTEIN, PLEURAL OR PERITONEAL FLUID: Total protein, fluid: 3 g/dL

## 2016-11-21 LAB — ALBUMIN, PLEURAL OR PERITONEAL FLUID: Albumin, Fluid: 1.5 g/dL

## 2016-11-21 LAB — CBC
HCT: 27.1 % — ABNORMAL LOW (ref 36.0–46.0)
Hemoglobin: 8.6 g/dL — ABNORMAL LOW (ref 12.0–15.0)
MCH: 30.8 pg (ref 26.0–34.0)
MCHC: 31.7 g/dL (ref 30.0–36.0)
MCV: 97.1 fL (ref 78.0–100.0)
PLATELETS: 412 10*3/uL — AB (ref 150–400)
RBC: 2.79 MIL/uL — AB (ref 3.87–5.11)
RDW: 17.4 % — AB (ref 11.5–15.5)
WBC: 6.9 10*3/uL (ref 4.0–10.5)

## 2016-11-21 LAB — GLUCOSE, CAPILLARY
GLUCOSE-CAPILLARY: 128 mg/dL — AB (ref 65–99)
Glucose-Capillary: 160 mg/dL — ABNORMAL HIGH (ref 65–99)
Glucose-Capillary: 200 mg/dL — ABNORMAL HIGH (ref 65–99)
Glucose-Capillary: 161 mg/dL — ABNORMAL HIGH (ref 65–99)

## 2016-11-21 LAB — PROTIME-INR
INR: 2.89
Prothrombin Time: 30 seconds — ABNORMAL HIGH (ref 11.4–15.2)

## 2016-11-21 LAB — GLUCOSE, PLEURAL OR PERITONEAL FLUID: Glucose, Fluid: 174 mg/dL

## 2016-11-21 MED ORDER — WARFARIN SODIUM 5 MG PO TABS
5.0000 mg | ORAL_TABLET | Freq: Once | ORAL | Status: AC
Start: 2016-11-21 — End: 2016-11-21
  Administered 2016-11-21: 5 mg via ORAL
  Filled 2016-11-21: qty 1

## 2016-11-21 NOTE — Progress Notes (Addendum)
[facility] PROGRESS NOTE  [name] OFB:[id number] DOB: 1945-07-04 DOA: 11/19/2016   PCP: [name], MD     Assessment/Plan: Active Problems:   Hyperlipemia   Essential hypertension   Coronary artery disease   GERD   Type 2 diabetes mellitus with stage 4 chronic kidney disease, with long-term current use of insulin (HCC)   Acute on chronic combined systolic and diastolic CHF (congestive heart failure) (HCC)   DM type 2 causing vascular disease (HCC)   Pleural effusion on right   Atrial fibrillation with RVR (HCC)   Generalized weakness   71 y.o.femalewith a hx of CAD with stents to the mid LAD, CTO of the left Cx, with non-obstructive RCA stenosis, combined systolic and diastolic heart failure, 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 Patient was optimized and discharged home on 9/12 with a new regimen of medications for heart failure. She felt fine for a few hours and then started developing lack of appetite. Patient returned back on 9/15 with A. fib with RVR, worsening renal function with creatinine at 3 (baseline closer to 2) and worsening right pleural effusion. Placed in stepdown unit and started on diuretics. Going for thoracentesis today, 9/17. No events overnight. Patient feeling better. Breathing a little easier as long she is on oxygen. Creatinine continues to improve, down to 2.6.  Assessment/Plan     atrial fibrillation with RVR, RVR has resolved Heart rate has improved since admission, continue metoprolol   TSH was normal previous admission - CHA2DS2 VASc score is 5 - started on anticoagulation with Coumadin by cardiologylast admission, continue Brilinta to 60 mg bid . INR supratherapeutic at 4.1 on admission, managed by pharmacy while admitted. Today at 2.89     Shortness of breath Multifactorial secondary to worsening right pleural effusion/occult pneumonia/bronchitis/atrial fibrillation with rapid ventricular response.   Right-sided pleural effusion Ordered ultrasound-guided thoracentesis with interventional radiology for 9/17 , likely transudative due to CHF, this would be her second thoracentesis    Chronic combined systolic and diastolic CHF- acute worsening  TTE this admit notes EF 30-35% w/ grade 2 DD -diuresed with lasix 80 mg IV BID previous admission, hold diuretics today given increase in BUN and creatinine in the setting of decreased oral intake. Will resume torsemide at 60 mg a day . Continue- Metoprolol   75 mg daily and hydralazine for more optimal BP control . Avoid ACE/ARB due to renal fn.  Weight continues to remain stable during this hospitalization   Acute kidney injury on chronic kidney disease stage II Baseline creatinine 2.0 - did not improve with volume expansion previously-cr is now close to 3,  perhaps she suffered a period of poor perfusion in setting of Afib w/ RVR /dehydration due to poor oral intake - held diuretics on admission a,now  resume torsemide at a lower dose.  Responding well, down to 2.6.      CAD w/ stents in mid RCA and distal RCA/PDA Last cardiac cath Sept 2017 -   EF has worsened - Ranexa stopped in setting of worsening renal fxn  Lexiscan Myoview stress test on 11/15/16. nuclear stress test demonstrative of a large area of scar with no ischemia coronary angiography given her CKD stage IV and high likelihood for development of contrast-mediated nephropathy and need for dialysis  Subclavian artery occlusion S/p thromboembolectomy -  continue aspirin and Brillinta  Anemia of chronic disease No evidence of acute blood loss - likely due to chronic kidney disease - follow trend   E coli UTI  Completed treatment  Diabetes mellitus on long-term use of insulin, uncontrolled with renal  dysfunction  Poorly controlled - A1c 8.8  Continue Lantus and sliding scale insulin  Generalized anxiety disorder Appears well compensated at present     DVT prophylaxsis coumadin  Code Status:  Full    Family Communication:Sister at the bedside  Disposition Plan:   potential discharge tomorrow if creatinine further improved and patient breathing more comfortably.       Consultants:Interventional radiology  Procedures: Thoracentesis planned for 9/17   Antibiotics: Anti-infectives    Start     Dose/Rate Route Frequency Ordered Stop   11/20/16 1100  vancomycin (VANCOCIN) IVPB 1000 mg/200 mL premix     1,000 mg 200 mL/hr over 60 Minutes Intravenous Every 24 hours 11/19/16 1211     11/20/16 1000  ceFEPIme (MAXIPIME) 1 g in dextrose 5 % 50 mL IVPB     1 g 100 mL/hr over 30 Minutes Intravenous Every 24 hours 11/19/16 1211     11/20/16 0000  vancomycin (VANCOCIN) 1,500 mg in sodium chloride 0.9 % 500 mL IVPB  Status:  Discontinued     1,500 mg 250 mL/hr over 120 Minutes Intravenous Every 12 hours 11/19/16 1205 11/19/16 1211   11/19/16 1215  ceFEPIme (MAXIPIME) 2 g in dextrose 5 % 50 mL IVPB  Status:  Discontinued     2 g 100 mL/hr over 30 Minutes Intravenous  Once 11/19/16 1205 11/19/16 1211   11/19/16 1015  vancomycin (VANCOCIN) 1,500 mg in sodium chloride 0.9 % 500 mL IVPB     1,500 mg 250 mL/hr over 120 Minutes Intravenous  Once 11/19/16 1000 11/19/16 1245   11/19/16 0945  ceFEPIme (MAXIPIME) 2 g in dextrose 5 % 50 mL IVPB     2 g 100 mL/hr over 30 Minutes Intravenous  Once 11/19/16 0944 11/19/16 1041   11/19/16 0945  vancomycin (VANCOCIN) IVPB 1000 mg/200 mL premix  Status:  Discontinued     1,000 mg 200 mL/hr over 60 Minutes Intravenous  Once 11/19/16 0944 11/19/16 1000         HPI/Subjective: Placed on oxygen last  Night , no tachycardia, cp , slightly sob   Objective: Vitals:   11/21/16 1000 11/21/16 1030 11/21/16 1100 11/21/16 1214  BP: (!) 108/59 (!)  125/97 (!) 146/79 (!) 122/57  Pulse:    80  Resp: (!) 26 (!) 24 (!) 25 20  Temp:    98.3 F (36.8 C)  TempSrc:    Oral  SpO2: 100% 100% 100% 100%  Weight:      Height:        Intake/Output Summary (Last 24 hours) at 11/21/16 1318 Last data filed at 11/21/16 1200  Gross per 24 hour  Intake              880 ml  Output             2700 ml  Net            -1820 ml    Exam:  Examination:  General exam: Alert and oriented 3, no acute distress HEENT: Normocephalic nature matter, uterus membranes are moist Neck: Supple, no JVD  Respiratory system:Decreased breath sounds right side, otherwise clear to auscultation bilaterally  Cardiovascular system: Irregular rhythm, rate controlled, breathing not labored  Gastrointestinal  system: Soft, nontender, nondistended, positive bowel sounds neurologic: No focal deficits musculoskeletal: No clubbing or cyanosis, trace pitting edema bilaterally  Skin: No skin breaks, tears or lesions  Psychiatry: Patient is appropriate, no evidence of psychoses    Data Reviewed: I have personally reviewed following labs and imaging studies  Micro Results No results found for this or any previous visit (from the past 240 hour(s)).  Radiology Reports Dg Chest 1 View  Result Date: 11/21/2016 CLINICAL DATA:  Pleural effusions, right greater than left. EXAM: CHEST 1 VIEW COMPARISON:  11/20/2016 FINDINGS: Right pleural effusion has been eliminated. There is a moderate left effusion which appears slightly more prominent than on the prior study. Overall heart size and pulmonary vascularity are normal. No pneumothorax after thoracentesis. IMPRESSION: No pneumothorax after right thoracentesis. No visible residual right pleural fluid. Moderate left effusion appears slightly increased. Electronically Signed   By: Lorriane Shire M.D.   On: 11/21/2016 13:08   Dg Chest 1 View  Result Date: 10/31/2016 CLINICAL DATA:  Status post right thoracentesis. EXAM: CHEST 1 VIEW  COMPARISON:  10/31/2016. FINDINGS: Stable enlarged cardiac silhouette. Minimal left lower lobe atelectasis with improvement. Minimal bilateral pleural fluid, mildly improved on the left and significantly improved on the right following thoracentesis. No pneumothorax. Thoracic spine degenerative changes. IMPRESSION: 1. Almost completely resolved right pleural fluid following thoracentesis without pneumothorax. 2. Small left pleural effusion with improvement with decreased left basilar atelectasis. Electronically Signed   By: Claudie Revering M.D.   On: 10/31/2016 13:30   Dg Chest 2 View  Result Date: 11/19/2016 CLINICAL DATA:  Short of breath. Discharged 3 days ago with congestive heart failure. EXAM: CHEST  2 VIEW COMPARISON:  11/10/2016 FINDINGS: Degraded lateral view secondary to positioning and overlying artifact. Midline trachea. Normal heart size for level of inspiration. Increase in moderate right pleural effusion. No pneumothorax. Low lung volumes. Mild pulmonary venous congestion. Worsened right and developing mild left base airspace disease. IMPRESSION: Increase in moderate right pleural effusion with adjacent atelectasis or infection. Mild pulmonary venous congestion with minimal left base atelectasis. Electronically Signed   By: Abigail Miyamoto M.D.   On: 11/19/2016 09:08   Dg Chest 2 View  Result Date: 10/31/2016 CLINICAL DATA:  Chest pain and shortness of breath.  Cough. EXAM: CHEST  2 VIEW COMPARISON:  08/07/2016 FINDINGS: There is cardiomegaly. Bilateral pleural effusions, moderate on the right and small on the left. There is vascular congestion and peribronchial cuffing suggesting pulmonary edema. No pneumothorax. IMPRESSION: Cardiomegaly with bilateral pleural effusions and vascular congestion. Peribronchial cuffing may reflect pulmonary edema or bronchial inflammation. Electronically Signed   By: Jeb Levering M.D.   On: 10/31/2016 06:11   Ct Soft Tissue Neck Wo Contrast  Result Date:  10/29/2016 CLINICAL DATA:  Left-sided neck pain after doing exercises in cardiac rehab last week. EXAM: CT NECK WITHOUT CONTRAST TECHNIQUE: Multidetector CT imaging of the neck was performed following the standard protocol without intravenous contrast. COMPARISON:  None. FINDINGS: Pharynx and larynx: No noted mass or inflammation. Limited at the level of the larynx due to patient motion. Salivary glands: No inflammation, mass, or stone. Thyroid: Normal. Lymph nodes: No enlargement or abnormal density of cervical lymph nodes. Vascular: Limited without contrast. Moderate calcified plaque at the carotid bifurcations. Limited intracranial: Negative Visualized orbits: Partly seen. No acute finding. Right cataract resection. Mastoids and visualized paranasal sinuses: Lobulated soft tissue in the left maxillary could be polyp or lobulated retention cyst. No destructive changes in the neighboring bone.  Skeleton: Negative for acute or destructive finding. There are calcified disc bulges from C2-3 to C6-7, with ligamentum flavum thickening and calcification at C4-5. These cause cord deformity at C3-4 and C4-5. Patient has undergone laminectomy at C5-6. Upper chest: Left peritracheal soft tissue is likely multiple borderline enlarged lymph nodes rather than 1 large node or nodal conglomerate. These could be congestive given layering pleural effusions, small where seen. IMPRESSION: 1. Limited by patient motion and lack of IV contrast. No specific explanation for acute neck pain. 2. Advanced cervical spine degeneration with calcified disc bulges and ligamentum flavum thickening. 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 3. Layering pleural effusions, small where seen. 4. Polypoid soft tissue in the left maxillary sinus, nonspecific without contrast. Electronically Signed   By: Monte Fantasia M.D.   On: 10/29/2016 18:39   Ct Chest Wo Contrast  Result Date:  11/19/2016 CLINICAL DATA:  Shortness of breath. EXAM: CT CHEST WITHOUT CONTRAST TECHNIQUE: Multidetector CT imaging of the chest was performed following the standard protocol without IV contrast. COMPARISON:  Chest x-ray from same day. CT chest dated October 31, 2016. FINDINGS: Cardiovascular: Mild cardiomegaly, unchanged. New small pericardial effusion. Coronary, aortic arch, and branch vessel atherosclerotic vascular disease. Mediastinum/Nodes: Prominent and mildly enlarged mediastinal lymph nodes are stable to slightly decreased in size. No axillary lymphadenopathy. The thyroid gland, trachea, and esophagus are unremarkable. Lungs/Pleura: Large right and small left pleural effusions. Right greater than left bibasilar atelectasis. Additional atelectasis in the right middle lobe. No suspicious pulmonary nodules. No consolidation or pneumothorax. Upper Abdomen: No acute abnormality. Musculoskeletal: No chest wall mass or suspicious bone lesions identified. No fracture. Unchanged degenerative changes of the thoracic spine, worst at T7-T8 and T10-T11. IMPRESSION: 1. Large right and small left pleural effusions with adjacent right greater than left atelectasis. 2. New small pericardial effusion. 3. Stable to slightly decreased in size mildly enlarged mediastinal lymph nodes, likely reactive. 4.  Aortic atherosclerosis (ICD10-I70.0). Electronically Signed   By: Titus Dubin M.D.   On: 11/19/2016 11:16   Ct Chest Wo Contrast  Result Date: 10/31/2016 CLINICAL DATA:  Shortness of breath with exertion EXAM: CT CHEST WITHOUT CONTRAST TECHNIQUE: Multidetector CT imaging of the chest was performed following the standard protocol without IV contrast. COMPARISON:  Chest x-ray 10/31/2016 FINDINGS: Cardiovascular: Aortic and coronary artery calcifications. No aneurysm. Mild cardiomegaly. Mediastinum/Nodes: Scattered borderline and mildly enlarged mediastinal lymph nodes. Index AP window lymph node has a short axis diameter  of 18 mm. Index right paratracheal node has a short axis diameter of 9 mm. No axillary adenopathy. Lungs/Pleura: Large right pleural effusion and small left pleural effusion. Bibasilar atelectasis. Mild vascular congestion. Upper Abdomen: Imaging into the upper abdomen shows no acute findings. Musculoskeletal: Chest wall soft tissues are unremarkable. No acute bony abnormality. IMPRESSION: Large right pleural effusion and small left effusion. Bibasilar atelectasis. Vascular congestion. Extensive coronary artery calcifications. Mildly enlarged mediastinal lymph nodes, likely reactive. Aortic Atherosclerosis (ICD10-I70.0). Electronically Signed   By: Rolm Baptise M.D.   On: 10/31/2016 07:08   Dg Chest Right Decubitus  Result Date: 11/19/2016 CLINICAL DATA:  Right pleural effusion. EXAM: CHEST - RIGHT DECUBITUS COMPARISON:  Chest x-ray from earlier today and CT of the chest November 19, 2016 FINDINGS: The right-side-down decubitus film demonstrates a layering right-sided effusion which correlates with comparison imaging. IMPRESSION: The right-sided pleural effusion layers on this study. Electronically Signed   By: Dorise Bullion III M.D   On:  11/19/2016 15:29   Nm Myocar Multi W/spect W/wall Motion / Ef  Result Date: 11/15/2016  There was no ST segment deviation noted during stress. T wave inversions inferiorly.  Defect 1: There is a large defect of severe severity present in the basal inferolateral, basal anterolateral, mid anterior, mid anteroseptal, mid inferolateral, mid anterolateral, apical anterior, apical inferior, apical lateral and apex location.  This is a high risk study.  Findings consistent with prior myocardial infarction. No ischemic territories.  Nuclear stress EF: 21%.    Dg Chest Port 1 View  Result Date: 11/20/2016 CLINICAL DATA:  Shortness of breath EXAM: PORTABLE CHEST 1 VIEW COMPARISON:  11/19/2016 FINDINGS: Large right and small to moderate left pleural effusions causing hazy  opacities obscuring the lung bases. Mild enlargement of the cardiopericardial silhouette. No findings of acute pulmonary edema. The right pleural effusion has a greater layering effect than on 11/19/2016, possibly due to lack of complete settling prior to imaging. IMPRESSION: 1. Similar appearance of large right and small to moderate left pleural effusions with passive atelectasis. 2. Mild enlargement of the cardiopericardial silhouette. Electronically Signed   By: Van Clines M.D.   On: 11/20/2016 12:09   Dg Chest Port 1 View  Result Date: 11/10/2016 CLINICAL DATA:  Shortness of breath tonight EXAM: PORTABLE CHEST 1 VIEW COMPARISON:  October 31, 2016 FINDINGS: The mediastinal contour is normal. The heart size is mildly enlarged. There is a small right pleural effusion with patchy consolidation of right lung base. The left lung is clear. No acute abnormalities identified in the visualized bones. IMPRESSION: Small right pleural effusion with patchy consolidation of right lung base, underline pneumonia is not excluded. Electronically Signed   By: Abelardo Diesel M.D.   On: 11/10/2016 21:28   US Thoracentesis Asp Pleural Space W/img Guide  Result Date: 11/21/2016 INDICATION: Right pleural effusion. EXAM: ULTRASOUND GUIDED RIGHT THORACENTESIS MEDICATIONS: None. COMPLICATIONS: None immediate. PROCEDURE: An ultrasound guided thoracentesis was thoroughly discussed with the patient and questions answered. The benefits, risks, alternatives and complications were also discussed. The patient understands and wishes to proceed with the procedure. Written consent was obtained. Ultrasound was performed to localize and mark an adequate pocket of fluid in the RIGHT chest. The area was then prepped and draped in the normal sterile fashion. 1% Lidocaine was used for local anesthesia. Under ultrasound guidance a Yueh catheter was introduced. Thoracentesis was performed. The catheter was removed and a dressing applied.  FINDINGS: A total of approximately 1.7 L of dark blood-tinged fluid was removed. Samples were sent to the laboratory as requested by the clinical team. IMPRESSION: Successful ultrasound guided right thoracentesis yielding 1.7 L of pleural fluid. Electronically Signed   By: Lorriane Shire M.D.   On: 11/21/2016 13:09   US Thoracentesis Asp Pleural Space W/img Guide  Result Date: 10/31/2016 INDICATION: Right pleural effusion. EXAM: ULTRASOUND GUIDED RIGHT THORACENTESIS MEDICATIONS: None. COMPLICATIONS: None immediate. PROCEDURE: An ultrasound guided thoracentesis was thoroughly discussed with the patient and questions answered. The benefits, risks, alternatives and complications were also discussed. The patient understands and wishes to proceed with the procedure. Written consent was obtained. Ultrasound was performed to localize and mark an adequate pocket of fluid in the right chest. The area was then prepped and draped in the normal sterile fashion. 1% Lidocaine was used for local anesthesia. Under ultrasound guidance a Yuen catheter was introduced. Thoracentesis was performed. The catheter was removed and a dressing applied. FINDINGS: A total of approximately 1 L of blood-tinged fluid was  removed. Samples were sent to the laboratory as requested by the clinical team. IMPRESSION: Successful ultrasound guided right thoracentesis yielding 1 L of pleural fluid. The patient tolerated the procedure well. No immediate complications. Electronically Signed   By: Lorriane Shire M.D.   On: 10/31/2016 13:09     CBC  Recent Labs Lab 11/16/16 0613 11/19/16 0815 11/21/16 0452  WBC 8.1 7.6 6.9  HGB 8.8* 8.1* 8.6*  HCT 27.7* 25.0* 27.1*  PLT 354 372 412*  MCV 96.2 95.1 97.1  MCH 30.6 30.8 30.8  MCHC 31.8 32.4 31.7  RDW 16.8* 16.9* 17.4*  LYMPHSABS  --  0.4*  --   MONOABS  --  0.7  --   EOSABS  --  0.0  --   BASOSABS  --  0.0  --     Chemistries   Recent Labs Lab 11/15/16 0609 11/16/16 0613  11/19/16 0815 11/20/16 1018 11/21/16 0452  NA 134* 134* 134* 135 138  K 3.3* 3.9 4.3 4.2 3.9  CL 96* 98* 96* 100* 101  CO2 24 24 25 23 26   GLUCOSE 127* 182* 181* 146* 167*  BUN 65* 62* 67* 62* 60*  CREATININE 2.87* 2.81* 3.00* 2.80* 2.60*  CALCIUM 9.1 9.1 9.0 8.8* 8.6*  MG 2.3  --   --   --   --   AST 51*  --  62*  --  40  ALT 54  --  54  --  40  ALKPHOS 169*  --  164*  --  153*  BILITOT 0.6  --  0.4  --  0.5   ------------------------------------------------------------------------------------------------------------------ estimated creatinine clearance is 24.1 mL/min (A) (by C-G formula based on SCr of 2.6 mg/dL (H)). ------------------------------------------------------------------------------------------------------------------ No results for input(s): HGBA1C in the last 72 hours. ------------------------------------------------------------------------------------------------------------------ No results for input(s): CHOL, HDL, LDLCALC, TRIG, CHOLHDL, LDLDIRECT in the last 72 hours. ------------------------------------------------------------------------------------------------------------------ No results for input(s): TSH, T4TOTAL, T3FREE, THYROIDAB in the last 72 hours.  Invalid input(s): FREET3 ------------------------------------------------------------------------------------------------------------------ No results for input(s): VITAMINB12, FOLATE, FERRITIN, TIBC, IRON, RETICCTPCT in the last 72 hours.  Coagulation profile  Recent Labs Lab 11/15/16 0602 11/16/16 0613 11/19/16 0815 11/20/16 0426 11/21/16 0452  INR 1.59 1.48 4.10* 3.15 2.89    No results for input(s): DDIMER in the last 72 hours.  Cardiac Enzymes No results for input(s): CKMB, TROPONINI, MYOGLOBIN in the last 168 hours.  Invalid input(s): CK ------------------------------------------------------------------------------------------------------------------ Invalid input(s):  POCBNP   CBG:  Recent Labs Lab 11/20/16 1132 11/20/16 1625 11/20/16 2124 11/21/16 0723 11/21/16 1141  GLUCAP 139* 128* 273* 128* 160*       Studies: Dg Chest 1 View  Result Date: 11/21/2016 CLINICAL DATA:  Pleural effusions, right greater than left. EXAM: CHEST 1 VIEW COMPARISON:  11/20/2016 FINDINGS: Right pleural effusion has been eliminated. There is a moderate left effusion which appears slightly more prominent than on the prior study. Overall heart size and pulmonary vascularity are normal. No pneumothorax after thoracentesis. IMPRESSION: No pneumothorax after right thoracentesis. No visible residual right pleural fluid. Moderate left effusion appears slightly increased. Electronically Signed   By: Lorriane Shire M.D.   On: 11/21/2016 13:08   Dg Chest Right Decubitus  Result Date: 11/19/2016 CLINICAL DATA:  Right pleural effusion. EXAM: CHEST - RIGHT DECUBITUS COMPARISON:  Chest x-ray from earlier today and CT of the chest November 19, 2016 FINDINGS: The right-side-down decubitus film demonstrates a layering right-sided effusion which correlates with comparison imaging. IMPRESSION: The right-sided pleural effusion layers on this study. Electronically Signed  By: Dorise Bullion III M.D   On: 11/19/2016 15:29   Dg Chest Port 1 View  Result Date: 11/20/2016 CLINICAL DATA:  Shortness of breath EXAM: PORTABLE CHEST 1 VIEW COMPARISON:  11/19/2016 FINDINGS: Large right and small to moderate left pleural effusions causing hazy opacities obscuring the lung bases. Mild enlargement of the cardiopericardial silhouette. No findings of acute pulmonary edema. The right pleural effusion has a greater layering effect than on 11/19/2016, possibly due to lack of complete settling prior to imaging. IMPRESSION: 1. Similar appearance of large right and small to moderate left pleural effusions with passive atelectasis. 2. Mild enlargement of the cardiopericardial silhouette. Electronically Signed   By:  Van Clines M.D.   On: 11/20/2016 12:09   US Thoracentesis Asp Pleural Space W/img Guide  Result Date: 11/21/2016 INDICATION: Right pleural effusion. EXAM: ULTRASOUND GUIDED RIGHT THORACENTESIS MEDICATIONS: None. COMPLICATIONS: None immediate. PROCEDURE: An ultrasound guided thoracentesis was thoroughly discussed with the patient and questions answered. The benefits, risks, alternatives and complications were also discussed. The patient understands and wishes to proceed with the procedure. Written consent was obtained. Ultrasound was performed to localize and mark an adequate pocket of fluid in the RIGHT chest. The area was then prepped and draped in the normal sterile fashion. 1% Lidocaine was used for local anesthesia. Under ultrasound guidance a Yueh catheter was introduced. Thoracentesis was performed. The catheter was removed and a dressing applied. FINDINGS: A total of approximately 1.7 L of dark blood-tinged fluid was removed. Samples were sent to the laboratory as requested by the clinical team. IMPRESSION: Successful ultrasound guided right thoracentesis yielding 1.7 L of pleural fluid. Electronically Signed   By: Lorriane Shire M.D.   On: 11/21/2016 13:09      Lab Results  Component Value Date   HGBA1C 8.8 (H) 10/31/2016   HGBA1C 9.6 09/28/2016   HGBA1C 10.3 (H) 07/09/2016   Lab Results  Component Value Date   LDLCALC 96 07/07/2015   CREATININE 2.60 (H) 11/21/2016       Scheduled Meds: . cyclobenzaprine  5 mg Oral QHS  . fluticasone  1 spray Each Nare Daily  . hydrALAZINE  25 mg Oral Q8H  . insulin aspart  0-9 Units Subcutaneous TID WC  . insulin glargine  30 Units Subcutaneous QHS  . isosorbide mononitrate  60 mg Oral Daily  . metoprolol succinate  75 mg Oral Q breakfast  . pantoprazole  40 mg Oral Daily  . rosuvastatin  40 mg Oral QHS  . ticagrelor  60 mg Oral BID  . torsemide  60 mg Oral Daily  . warfarin  5 mg Oral Once  . Warfarin - Pharmacist Dosing  Inpatient   Does not apply q1800   Continuous Infusions: . ceFEPime (MAXIPIME) IV Stopped (11/21/16 1113)  . vancomycin Stopped (11/20/16 1613)     LOS: 2 days    [facility] Pager [phone]. If 7PM-7AM, please contact night-coverage at [email or web address], password [facility] 11/21/2016, 1:18 PM  LOS: 2 days

## 2016-11-21 NOTE — Progress Notes (Signed)
Thoracentesis complete no signs of distress; 1.7L red colored pleural fluid removed.

## 2016-11-21 NOTE — Care Management Note (Signed)
Case Management Note  Patient Details  Name: [name] MRN: [id number] Date of Birth: [birth year]  Subjective/Objective: Adm with Afib with RVR. From home, ind PTA. Recent admission for same on  9/6-9/12. She was discharged with home health RN and PT services provided by [facility].                Action/Plan: Anticipate DC home with resumption of home health services.   Expected Discharge Date:  11/23/2016               Expected Discharge Plan:  [place]  In-House Referral:     Discharge planning Services  CM Consult  Post Acute Care Choice:  Home Health, Resumption of Svcs/PTA Provider Choice offered to:     DME Arranged:    DME Agency:     HH Arranged:    [place] Agency:  [facility]  Status of Service:  In process, will continue to follow  If discussed at Long Length of Stay Meetings, dates discussed:    Additional Comments:  Navy Belay, Chauncey Reading, RN 11/21/2016, 2:56 PM

## 2016-11-21 NOTE — Progress Notes (Signed)
ANTICOAGULATION CONSULT NOTE - follow up  Pharmacy Consult for coumadin Indication: atrial fibrillation  Allergies  Allergen Reactions  . Ace Inhibitors Cough  . Amlodipine     Edema   . Codeine Rash    Patient Measurements: Height: 5\' 8"  (172.7 cm) Weight: 212 lb 8.4 oz (96.4 kg) IBW/kg (Calculated) : 63.9  Vital Signs: Temp: 98.2 F (36.8 C) (09/17 0800) Temp Source: Oral (09/17 0800) BP: 119/75 (09/17 0830)  Labs:  Recent Labs  11/19/16 0815 11/20/16 0426 11/20/16 1018 11/21/16 0452  HGB 8.1*  --   --  8.6*  HCT 25.0*  --   --  27.1*  PLT 372  --   --  412*  LABPROT 39.5* 32.1*  --  30.0*  INR 4.10* 3.15  --  2.89  CREATININE 3.00*  --  2.80* 2.60*    Estimated Creatinine Clearance: 24.1 mL/min (A) (by C-G formula based on SCr of 2.6 mg/dL (H)).   Medical History: Past Medical History:  Diagnosis Date  . Acute renal failure superimposed on stage 4 chronic kidney disease ([place]) 07/04/2015  . Allergic rhinitis   . Anemia   . Anxiety   . CAD in native artery    NSTEMI 07/2015 s/p DES to RCA and posterior PDA, PCI 10/2015 with scoring balloon to 85% ISR of distal RCA), known LAD/Cx disease treated medically  . Carotid artery disease ([place])    Mild bilateral carotid disease (1-39% 07/2015)  . Chronic combined systolic and diastolic CHF (congestive heart failure) ([place])   . CKD (chronic kidney disease), stage IV ([place])   . Constipation   . Diabetes mellitus   . GERD (gastroesophageal reflux disease)   . Heart murmur, systolic   . History of hysterectomy   . Hyperlipidemia   . Hypertension   . Low back pain   . Obesity   . 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 Osteoarthritis   . Overactive bladder   . PVC's (premature ventricular contractions)     Medications:  Prescriptions Prior to Admission  Medication Sig Dispense Refill Last Dose  . acetaminophen (TYLENOL) 500 MG  tablet Take 2 tablets (1,000 mg total) by mouth every 6 (six) hours as needed (pain). 30 tablet 0 11/18/2016 at Unknown time  . albuterol (PROVENTIL HFA;VENTOLIN HFA) 108 (90 Base) MCG/ACT inhaler Inhale 1-2 puffs into the lungs every 6 (six) hours as needed for wheezing or shortness of breath. 1 Inhaler 0 11/19/2016 at Unknown time  . albuterol (PROVENTIL) (2.5 MG/3ML) 0.083% nebulizer solution Take 3 mLs (2.5 mg total) by nebulization every 6 (six) hours as needed for wheezing or shortness of breath. 75 mL 1 11/19/2016 at Unknown time  . ALPRAZolam (XANAX) 0.5 MG tablet Take 0.5 mg by mouth at bedtime.     . cefUROXime (CEFTIN) 500 MG tablet Take 1 tablet (500 mg total) by mouth daily with breakfast. 5 tablet 0 11/18/2016 at Unknown time  . cyclobenzaprine (FLEXERIL) 5 MG tablet Take 5 mg by mouth at bedtime.  0 Past Week at Unknown time  . diazepam (VALIUM) 5 MG tablet Take 1 tablet (5 mg total) by mouth every 12 (twelve) hours as needed for muscle spasms. 10 tablet 0 Past Week at Unknown time  . ferrous sulfate 325 (65 FE) MG tablet Take 325 mg by mouth daily with breakfast.    11/18/2016 at Unknown time  . fluticasone (FLONASE) 50 MCG/ACT nasal spray Place  1 spray into both nostrils daily.   11/18/2016 at Unknown time  . hydrALAZINE (APRESOLINE) 100 MG tablet Take 1 tablet (100 mg total) by mouth 3 (three) times daily. 120 tablet 1 11/18/2016 at Unknown time  . insulin glargine (LANTUS) 100 UNIT/ML injection Inject 40 Units into the skin at bedtime.   11/18/2016 at Unknown time  . insulin lispro (HUMALOG KWIKPEN) 100 UNIT/ML KiwkPen INJECT 18 TO 24 UNITS TOTAL INTO THE SKIN THREE TIMES DAILY 15 mL 2 11/18/2016 at Unknown time  . isosorbide mononitrate (IMDUR) 60 MG 24 hr tablet Take 1 tablet (60 mg total) by mouth daily. 30 tablet 0 11/18/2016 at Unknown time  . latanoprost (XALATAN) 0.005 % ophthalmic solution Place 1 drop into both eyes at bedtime.    11/18/2016 at Unknown time  . meclizine (ANTIVERT) 25  MG tablet Take 12.5 mg by mouth 2 (two) times daily.    11/18/2016 at Unknown time  . metoprolol succinate (TOPROL-XL) 25 MG 24 hr tablet Take 3 tablets (75 mg total) by mouth daily. Take with or immediately following a meal. 30 tablet 2 11/18/2016 at 2200  . Multiple Vitamin (MULTIVITAMIN WITH MINERALS) TABS tablet Take 1 tablet by mouth daily.   Past Week at Unknown time  . nitroGLYCERIN (NITROSTAT) 0.4 MG SL tablet Place 1 tablet (0.4 mg total) under the tongue every 5 (five) minutes as needed for chest pain. 25 tablet 2 unknown at unknown  . pantoprazole (PROTONIX) 40 MG tablet Take 40 mg by mouth daily.    11/18/2016 at Unknown time  . potassium chloride SA (K-DUR,KLOR-CON) 20 MEQ tablet Take 2 tablets (40 mEq total) by mouth daily. 60 tablet 5 11/18/2016 at Unknown time  . RANEXA 500 MG 12 hr tablet TAKE ONE TABLET BY MOUTH TWICE DAILY 60 tablet 5 11/18/2016 at Unknown time  . rosuvastatin (CRESTOR) 40 MG tablet Take 1 tablet (40 mg total) by mouth at bedtime. 30 tablet 0 11/18/2016 at Unknown time  . ticagrelor (BRILINTA) 60 MG TABS tablet Take 1 tablet (60 mg total) by mouth 2 (two) times daily. 60 tablet 1 11/18/2016 at 2200  . torsemide (DEMADEX) 20 MG tablet Take 60 mg by mouth 2 (two) times daily. Pt is able to take an additional tablet daily for weight gain greater than 3lbs.   11/18/2016 at Unknown time  . trolamine salicylate (ASPERCREME) 10 % cream Apply 1 application topically as needed for muscle pain.   Past Week at Unknown time  . warfarin (COUMADIN) 7.5 MG tablet Take 1 tablet (7.5 mg total) by mouth once. 45 tablet 2     Assessment: 71 yo female with significant cardiac history including CHF, CAD s/p PCI 2018; CKD stage 4; now with new onset Afib (nonvalvular).  Pt was initially started on rivaroxaban but due to renal fxn was switched to WARFARIN on 9/10. INR therapeutic today.  Goal of Therapy:  INR 2-3 Monitor platelets by anticoagulation protocol: Yes   Plan:  Coumadin 5mg   today x 1 PT-INR daily Monitor for S/S of bleeding  Hart Robinsons, PharmD Clinical Pharmacist Pager:  [phone] 11/21/2016   11/21/2016,10:25 AM

## 2016-11-22 LAB — COMPREHENSIVE METABOLIC PANEL
ALT: 32 U/L (ref 14–54)
AST: 29 U/L (ref 15–41)
Albumin: 2.6 g/dL — ABNORMAL LOW (ref 3.5–5.0)
Alkaline Phosphatase: 144 U/L — ABNORMAL HIGH (ref 38–126)
Anion gap: 9 (ref 5–15)
BUN: 50 mg/dL — ABNORMAL HIGH (ref 6–20)
CHLORIDE: 102 mmol/L (ref 101–111)
CO2: 28 mmol/L (ref 22–32)
CREATININE: 2.41 mg/dL — AB (ref 0.44–1.00)
Calcium: 8.5 mg/dL — ABNORMAL LOW (ref 8.9–10.3)
GFR calc Af Amer: 22 mL/min — ABNORMAL LOW (ref >60)
GFR calc non Af Amer: 19 mL/min — ABNORMAL LOW (ref >60)
Glucose, Bld: 106 mg/dL — ABNORMAL HIGH (ref 65–99)
Potassium: 3.5 mmol/L (ref 3.5–5.1)
SODIUM: 139 mmol/L (ref 135–145)
Total Bilirubin: 0.3 mg/dL (ref 0.3–1.2)
Total Protein: 6.8 g/dL (ref 6.5–8.1)

## 2016-11-22 LAB — GLUCOSE, CAPILLARY
GLUCOSE-CAPILLARY: 43 mg/dL — AB (ref 65–99)
GLUCOSE-CAPILLARY: 289 mg/dL — AB (ref 65–99)
Glucose-Capillary: 106 mg/dL — ABNORMAL HIGH (ref 65–99)
Glucose-Capillary: 178 mg/dL — ABNORMAL HIGH (ref 65–99)
Glucose-Capillary: 189 mg/dL — ABNORMAL HIGH (ref 65–99)

## 2016-11-22 LAB — PROTIME-INR
INR: 3.1
Prothrombin Time: 31.7 seconds — ABNORMAL HIGH (ref 11.4–15.2)

## 2016-11-22 MED ORDER — POLYETHYLENE GLYCOL 3350 17 G PO PACK
17.0000 g | PACK | Freq: Every day | ORAL | Status: DC
  Administered 2016-11-23: 17 g via ORAL
  Filled 2016-11-22 (×2): qty 1

## 2016-11-22 MED ORDER — POLYETHYLENE GLYCOL 3350 17 G PO PACK
17.0000 g | PACK | Freq: Every day | ORAL | Status: DC
  Administered 2016-11-22 – 2016-11-24 (×2): 17 g via ORAL
  Filled 2016-11-22: qty 1

## 2016-11-22 MED ORDER — INSULIN GLARGINE 100 UNIT/ML ~~LOC~~ SOLN
28.0000 [IU] | Freq: Every day | SUBCUTANEOUS | Status: DC
  Administered 2016-11-22 – 2016-11-23 (×2): 28 [IU] via SUBCUTANEOUS
  Filled 2016-11-22 (×3): qty 0.28

## 2016-11-22 NOTE — Progress Notes (Signed)
ANTICOAGULATION CONSULT NOTE - follow up  Pharmacy Consult for coumadin Indication: atrial fibrillation  Allergies  Allergen Reactions  . Ace Inhibitors Cough  . Amlodipine     Edema   . Codeine Rash   Patient Measurements: Height: 5\' 8"  (172.7 cm) Weight: 212 lb 8.4 oz (96.4 kg) IBW/kg (Calculated) : 63.9  Vital Signs: Temp: 98.3 F (36.8 C) (09/18 0800) Temp Source: Oral (09/18 0800) BP: 119/84 (09/18 0900) Pulse Rate: 77 (09/18 0600)  Labs:  Recent Labs  11/20/16 0426 11/20/16 1018 11/21/16 0452 11/22/16 0619 11/22/16 0934  HGB  --   --  8.6*  --   --   HCT  --   --  27.1*  --   --   PLT  --   --  412*  --   --   LABPROT 32.1*  --  30.0* 31.7*  --   INR 3.15  --  2.89 3.10  --   CREATININE  --  2.80* 2.60*  --  2.41*   Estimated Creatinine Clearance: 26 mL/min (A) (by C-G formula based on SCr of 2.41 mg/dL (H)).  Medical History: Past Medical History:  Diagnosis Date  . Acute renal failure superimposed on stage 4 chronic kidney disease ([place]) 07/04/2015  . Allergic rhinitis   . Anemia   . Anxiety   . CAD in native artery    NSTEMI 07/2015 s/p DES to RCA and posterior PDA, PCI 10/2015 with scoring balloon to 85% ISR of distal RCA), known LAD/Cx disease treated medically  . Carotid artery disease ([place])    Mild bilateral carotid disease (1-39% 07/2015)  . Chronic combined systolic and diastolic CHF (congestive heart failure) ([place])   . CKD (chronic kidney disease), stage IV ([place])   . Constipation   . Diabetes mellitus   . GERD (gastroesophageal reflux disease)   . Heart murmur, systolic   . History of hysterectomy   . Hyperlipidemia   . Hypertension   . Low back pain   . Obesity   . 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 Osteoarthritis   . Overactive bladder   . PVC's (premature ventricular contractions)    Medications:  Prescriptions Prior to Admission  Medication  Sig Dispense Refill Last Dose  . acetaminophen (TYLENOL) 500 MG tablet Take 2 tablets (1,000 mg total) by mouth every 6 (six) hours as needed (pain). 30 tablet 0 11/18/2016 at Unknown time  . albuterol (PROVENTIL HFA;VENTOLIN HFA) 108 (90 Base) MCG/ACT inhaler Inhale 1-2 puffs into the lungs every 6 (six) hours as needed for wheezing or shortness of breath. 1 Inhaler 0 11/19/2016 at Unknown time  . albuterol (PROVENTIL) (2.5 MG/3ML) 0.083% nebulizer solution Take 3 mLs (2.5 mg total) by nebulization every 6 (six) hours as needed for wheezing or shortness of breath. 75 mL 1 11/19/2016 at Unknown time  . ALPRAZolam (XANAX) 0.5 MG tablet Take 0.5 mg by mouth at bedtime.     . cefUROXime (CEFTIN) 500 MG tablet Take 1 tablet (500 mg total) by mouth daily with breakfast. 5 tablet 0 11/18/2016 at Unknown time  . cyclobenzaprine (FLEXERIL) 5 MG tablet Take 5 mg by mouth at bedtime.  0 Past Week at Unknown time  . diazepam (VALIUM) 5 MG tablet Take 1 tablet (5 mg total) by mouth every 12 (twelve) hours as needed for muscle spasms. 10 tablet 0 Past Week at Unknown time  . ferrous sulfate 325 (65  FE) MG tablet Take 325 mg by mouth daily with breakfast.    11/18/2016 at Unknown time  . fluticasone (FLONASE) 50 MCG/ACT nasal spray Place 1 spray into both nostrils daily.   11/18/2016 at Unknown time  . hydrALAZINE (APRESOLINE) 100 MG tablet Take 1 tablet (100 mg total) by mouth 3 (three) times daily. 120 tablet 1 11/18/2016 at Unknown time  . insulin glargine (LANTUS) 100 UNIT/ML injection Inject 40 Units into the skin at bedtime.   11/18/2016 at Unknown time  . insulin lispro (HUMALOG KWIKPEN) 100 UNIT/ML KiwkPen INJECT 18 TO 24 UNITS TOTAL INTO THE SKIN THREE TIMES DAILY 15 mL 2 11/18/2016 at Unknown time  . isosorbide mononitrate (IMDUR) 60 MG 24 hr tablet Take 1 tablet (60 mg total) by mouth daily. 30 tablet 0 11/18/2016 at Unknown time  . latanoprost (XALATAN) 0.005 % ophthalmic solution Place 1 drop into both eyes at  bedtime.    11/18/2016 at Unknown time  . meclizine (ANTIVERT) 25 MG tablet Take 12.5 mg by mouth 2 (two) times daily.    11/18/2016 at Unknown time  . metoprolol succinate (TOPROL-XL) 25 MG 24 hr tablet Take 3 tablets (75 mg total) by mouth daily. Take with or immediately following a meal. 30 tablet 2 11/18/2016 at 2200  . Multiple Vitamin (MULTIVITAMIN WITH MINERALS) TABS tablet Take 1 tablet by mouth daily.   Past Week at Unknown time  . nitroGLYCERIN (NITROSTAT) 0.4 MG SL tablet Place 1 tablet (0.4 mg total) under the tongue every 5 (five) minutes as needed for chest pain. 25 tablet 2 unknown at unknown  . pantoprazole (PROTONIX) 40 MG tablet Take 40 mg by mouth daily.    11/18/2016 at Unknown time  . potassium chloride SA (K-DUR,KLOR-CON) 20 MEQ tablet Take 2 tablets (40 mEq total) by mouth daily. 60 tablet 5 11/18/2016 at Unknown time  . RANEXA 500 MG 12 hr tablet TAKE ONE TABLET BY MOUTH TWICE DAILY 60 tablet 5 11/18/2016 at Unknown time  . rosuvastatin (CRESTOR) 40 MG tablet Take 1 tablet (40 mg total) by mouth at bedtime. 30 tablet 0 11/18/2016 at Unknown time  . ticagrelor (BRILINTA) 60 MG TABS tablet Take 1 tablet (60 mg total) by mouth 2 (two) times daily. 60 tablet 1 11/18/2016 at 2200  . torsemide (DEMADEX) 20 MG tablet Take 60 mg by mouth 2 (two) times daily. Pt is able to take an additional tablet daily for weight gain greater than 3lbs.   11/18/2016 at Unknown time  . trolamine salicylate (ASPERCREME) 10 % cream Apply 1 application topically as needed for muscle pain.   Past Week at Unknown time  . warfarin (COUMADIN) 7.5 MG tablet Take 1 tablet (7.5 mg total) by mouth once. 45 tablet 2    Assessment: 71 yo female with significant cardiac history including CHF, CAD s/p PCI 2018; CKD stage 4; now with new onset Afib (nonvalvular).  Pt was initially started on rivaroxaban but due to renal fxn was switched to WARFARIN on 9/10. INR has risen to SUPRAtherapeutic today.  Goal of Therapy:  INR  2-3 Monitor platelets by anticoagulation protocol: Yes   Plan:  HOLD coumadin today PT-INR daily Monitor for S/S of bleeding  Hart Robinsons, PharmD Clinical Pharmacist Pager:  [phone] 11/22/2016   11/22/2016,11:16 AM

## 2016-11-22 NOTE — Progress Notes (Signed)
Inpatient Diabetes Program Recommendations  AACE/ADA: New Consensus Statement on Inpatient Glycemic Control (2015)  Target Ranges:  Prepandial:   less than 140 mg/dL      Peak postprandial:   less than 180 mg/dL (1-2 hours)      Critically ill patients:  140 - 180 mg/dL  Results for [name], [name] (MRN [id number]) as of 11/22/2016 08:56  Ref. Range 11/21/2016 07:23 11/21/2016 11:41 11/21/2016 16:10 11/21/2016 21:37 11/22/2016 08:13  Glucose-Capillary Latest Ref Range: 65 - 99 mg/dL 128 (H) 160 (H) 200 (H) 161 (H) 43 (LL)    Review of Glycemic Control  Diabetes history: DM2 Outpatient Diabetes medications: Lantus 40 units QHS, Humalog 18-24 units TID Current orders for Inpatient glycemic control: Lantus 30 units QHS, Novolog 0-9 units TID with meals  Inpatient Diabetes Program Recommendations: Insulin - Basal: Fasting glucose 43 mg/dl today. Please consider decreasing Lantus to 27 units QHS.  Thanks, Barnie Alderman, RN, MSN, CDE Diabetes Coordinator Inpatient Diabetes Program [phone] (Team Pager from 8am to 5pm)

## 2016-11-22 NOTE — Consult Note (Signed)
   [facility] CM Inpatient Consult   11/22/2016  [name] 04/07/1945 [id number]   Chart review revealed patient eligible for [facility] Care Management services and post hospital discharge follow up related to a diagnosis of DM, HF, and CKD. Patient has had 6 admits in the last 6 months. Patient was previously involved with a RN community care manager with  [facility] care Management Program as a benefit of patient's  [employer]. Met with the patient at the bedside to ask if she would like to restart [facility] Care Management services. 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 She states she may not be able to return to work now. Verbal consent recieved. Patient gave [phone] as the best number to reach her. Patient will receive post hospital discharge calls and be evaluated for monthly home visits. [facility] Care Management services does not interfere with or replace any services arranged by the inpatient care management team. RNCM left contact information and [facility] literature at the bedside. Made inpatient RNCM aware that [facility] will be following for care management. For additional questions please contact:   Janci Minor RN, BSN [facility]  Hospital Liaison  ([phone]) Business Mobile ([phone]) Toll free office   

## 2016-11-22 NOTE — Progress Notes (Signed)
[facility] PROGRESS NOTE  [name] OZD:[id number] DOB: October 09, 1945 DOA: 11/19/2016   PCP: [name], MD     Assessment/Plan: Active Problems:   Hyperlipemia   Essential hypertension   Coronary artery disease   GERD   Type 2 diabetes mellitus with stage 4 chronic kidney disease, with long-term current use of insulin (HCC)   Acute on chronic combined systolic and diastolic CHF (congestive heart failure) (HCC)   DM type 2 causing vascular disease (HCC)   Pleural effusion on right   Atrial fibrillation with RVR (HCC)   Generalized weakness   70 y.o.femalewith a hx of CAD with stents to the mid LAD, CTO of the left Cx, with non-obstructive RCA stenosis, combined systolic and diastolic heart failure, 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 Patient was optimized and discharged home on 9/12 with a new regimen of medications for heart failure. She felt fine for a few hours and then started developing lack of appetite. Patient returned back on 9/15 with A. fib with RVR, worsening renal function with creatinine at 3 (baseline closer to 2) and worsening right pleural effusion. Placed in stepdown unit and started on diuretics. Going for thoracentesis today, 9/17.  Subjective Experience some coughing after the thoracentesis, shortness of breath is definitely improving, heart rate is controlled on telemetry  . Complains of being constipated  Assessment/Plan     atrial fibrillation with RVR, RVR has resolved Heart rate has improved since admission, continue metoprolol   TSH was normal previous admission - CHA2DS2 VASc score is 5 - started on anticoagulation with Coumadin by cardiologylast admission, continue Brilinta to 60 mg bid . INR supratherapeutic at 4.1 on admission, managed by pharmacy  while admitted. Today at 3.10    Shortness of breath Multifactorial secondary to worsening right pleural effusion/occult pneumonia/bronchitis/atrial fibrillation with rapid ventricular response.   Right-sided pleural effusion Status post ultrasound-guided thoracentesis with interventional radiology for 9/17, 1.7 L removed , likely transudative due to CHF, this would be her second thoracentesis    Chronic combined systolic and diastolic CHF- acute worsening  TTE this admit notes EF 30-35% w/ grade 2 DD -diuresed with lasix 80 mg IV BID previous admission,  creatinine has slowly been trending up. Peaked at 3 this admission. Now improving. Continue  torsemide at 60 mg a day . Continue- Metoprolol   75 mg daily and hydralazine  for CHF . Avoid ACE/ARB due to renal fn.  Weight continues to remain stable during this hospitalization   Acute kidney injury on chronic kidney disease stage II Baseline creatinine 2.0 - did not improve with volume expansion previously-cr is now close to 3,  perhaps she suffered a period of poor perfusion in setting of Afib w/ RVR /dehydration due to poor oral intake - held diuretics on admission a,now  resume torsemide at a lower dose.  Responding well, down to 2.6.      CAD w/ stents in mid RCA and distal RCA/PDA Last cardiac cath Sept 2017 -   EF has worsened - Ranexa stopped in setting of worsening renal fxn  Lexiscan Myoview stress test on 11/15/16. nuclear stress test demonstrative of a large area of scar with no ischemia coronary angiography given her CKD stage IV and high likelihood for development of contrast-mediated nephropathy and need for dialysis  Subclavian artery  occlusion S/p thromboembolectomy - continue aspirin and Brillinta  Anemia of chronic disease No evidence of acute blood loss - likely due to chronic kidney disease - follow trend   E coli UTI  Completed treatment previous admission  Diabetes mellitus on long-term use of  insulin, uncontrolled with renal dysfunction  Poorly controlled - A1c 8.8 , hypoglycemic this morning Reduced Lantus to 28 units at bedtime  Generalized anxiety disorder Appears well compensated at present     DVT prophylaxsis coumadin  Code Status:  Full    Family Communication:Sister at the bedside  Disposition Plan:   potential discharge tomorrow if creatinine further improved , PT eval      Consultants:Interventional radiology  Procedures: Thoracentesis planned for 9/17   Antibiotics: Anti-infectives    Start     Dose/Rate Route Frequency Ordered Stop   11/20/16 1100  vancomycin (VANCOCIN) IVPB 1000 mg/200 mL premix     1,000 mg 200 mL/hr over 60 Minutes Intravenous Every 24 hours 11/19/16 1211     11/20/16 1000  ceFEPIme (MAXIPIME) 1 g in dextrose 5 % 50 mL IVPB     1 g 100 mL/hr over 30 Minutes Intravenous Every 24 hours 11/19/16 1211     11/20/16 0000  vancomycin (VANCOCIN) 1,500 mg in sodium chloride 0.9 % 500 mL IVPB  Status:  Discontinued     1,500 mg 250 mL/hr over 120 Minutes Intravenous Every 12 hours 11/19/16 1205 11/19/16 1211   11/19/16 1215  ceFEPIme (MAXIPIME) 2 g in dextrose 5 % 50 mL IVPB  Status:  Discontinued     2 g 100 mL/hr over 30 Minutes Intravenous  Once 11/19/16 1205 11/19/16 1211   11/19/16 1015  vancomycin (VANCOCIN) 1,500 mg in sodium chloride 0.9 % 500 mL IVPB     1,500 mg 250 mL/hr over 120 Minutes Intravenous  Once 11/19/16 1000 11/19/16 1245   11/19/16 0945  ceFEPIme (MAXIPIME) 2 g in dextrose 5 % 50 mL IVPB     2 g 100 mL/hr over 30 Minutes Intravenous  Once 11/19/16 0944 11/19/16 1041   11/19/16 0945  vancomycin (VANCOCIN) IVPB 1000 mg/200 mL premix  Status:  Discontinued     1,000 mg 200 mL/hr over 60 Minutes Intravenous  Once 11/19/16 0944 11/19/16 1000            Objective: Vitals:   11/22/16 0600 11/22/16 0700 11/22/16 0800 11/22/16 0900  BP: 120/67 (!) 129/55 112/63 119/84  Pulse: 77     Resp: 17 16 16 20    Temp:   98.3 F (36.8 C)   TempSrc:   Oral   SpO2: 100% 100% 99% 100%  Weight:      Height:        Intake/Output Summary (Last 24 hours) at 11/22/16 0945 Last data filed at 11/22/16 2542  Gross per 24 hour  Intake              650 ml  Output              950 ml  Net             -300 ml    Exam:  Examination:  General exam: Alert and oriented 3, no acute distress HEENT: Normocephalic nature matter, uterus membranes are moist Neck: Supple, no JVD  Respiratory system:Decreased breath sounds right side, otherwise clear to auscultation bilaterally  Cardiovascular system: Irregular rhythm, rate controlled, breathing not labored  Gastrointestinal system: Soft, nontender, nondistended, positive bowel sounds neurologic: No focal  deficits musculoskeletal: No clubbing or cyanosis, trace pitting edema bilaterally  Skin: No skin breaks, tears or lesions  Psychiatry: Patient is appropriate, no evidence of psychoses    Data Reviewed: I have personally reviewed following labs and imaging studies  Micro Results Recent Results (from the past 240 hour(s))  Gram stain     Status: None   Collection Time: 11/21/16 12:15 PM  Result Value Ref Range Status   Specimen Description PLEURAL  Final   Special Requests NONE  Final   Gram Stain NO ORGANISMS SEEN PERFORMED AT [facility]   Final   Report Status 11/21/2016 FINAL  Final  Culture, body fluid-bottle     Status: None (Preliminary result)   Collection Time: 11/21/16 12:15 PM  Result Value Ref Range Status   Specimen Description PLEURAL  Final   Special Requests BOTTLES DRAWN AEROBIC AND ANAEROBIC 10 CC EACH  Final   Culture NO GROWTH < 24 HOURS  Final   Report Status PENDING  Incomplete    Radiology Reports Dg Chest 1 View  Result Date: 11/21/2016 CLINICAL DATA:  Pleural effusions, right greater than left. EXAM: CHEST 1 VIEW COMPARISON:  11/20/2016 FINDINGS: Right pleural effusion has been eliminated. There is a moderate left effusion  which appears slightly more prominent than on the prior study. Overall heart size and pulmonary vascularity are normal. No pneumothorax after thoracentesis. IMPRESSION: No pneumothorax after right thoracentesis. No visible residual right pleural fluid. Moderate left effusion appears slightly increased. Electronically Signed   By: Lorriane Shire M.D.   On: 11/21/2016 13:08   Dg Chest 1 View  Result Date: 10/31/2016 CLINICAL DATA:  Status post right thoracentesis. EXAM: CHEST 1 VIEW COMPARISON:  10/31/2016. FINDINGS: Stable enlarged cardiac silhouette. Minimal left lower lobe atelectasis with improvement. Minimal bilateral pleural fluid, mildly improved on the left and significantly improved on the right following thoracentesis. No pneumothorax. Thoracic spine degenerative changes. IMPRESSION: 1. Almost completely resolved right pleural fluid following thoracentesis without pneumothorax. 2. Small left pleural effusion with improvement with decreased left basilar atelectasis. Electronically Signed   By: Claudie Revering M.D.   On: 10/31/2016 13:30   Dg Chest 2 View  Result Date: 11/19/2016 CLINICAL DATA:  Short of breath. Discharged 3 days ago with congestive heart failure. EXAM: CHEST  2 VIEW COMPARISON:  11/10/2016 FINDINGS: Degraded lateral view secondary to positioning and overlying artifact. Midline trachea. Normal heart size for level of inspiration. Increase in moderate right pleural effusion. No pneumothorax. Low lung volumes. Mild pulmonary venous congestion. Worsened right and developing mild left base airspace disease. IMPRESSION: Increase in moderate right pleural effusion with adjacent atelectasis or infection. Mild pulmonary venous congestion with minimal left base atelectasis. Electronically Signed   By: Abigail Miyamoto M.D.   On: 11/19/2016 09:08   Dg Chest 2 View  Result Date: 10/31/2016 CLINICAL DATA:  Chest pain and shortness of breath.  Cough. EXAM: CHEST  2 VIEW COMPARISON:  08/07/2016  FINDINGS: There is cardiomegaly. Bilateral pleural effusions, moderate on the right and small on the left. There is vascular congestion and peribronchial cuffing suggesting pulmonary edema. No pneumothorax. IMPRESSION: Cardiomegaly with bilateral pleural effusions and vascular congestion. Peribronchial cuffing may reflect pulmonary edema or bronchial inflammation. Electronically Signed   By: Jeb Levering M.D.   On: 10/31/2016 06:11   Ct Soft Tissue Neck Wo Contrast  Result Date: 10/29/2016 CLINICAL DATA:  Left-sided neck pain after doing exercises in cardiac rehab last week. EXAM: CT NECK WITHOUT CONTRAST TECHNIQUE: Multidetector CT imaging  of the neck was performed following the standard protocol without intravenous contrast. COMPARISON:  None. FINDINGS: Pharynx and larynx: No noted mass or inflammation. Limited at the level of the larynx due to patient motion. Salivary glands: No inflammation, mass, or stone. Thyroid: Normal. Lymph nodes: No enlargement or abnormal density of cervical lymph nodes. Vascular: Limited without contrast. Moderate calcified plaque at the carotid bifurcations. Limited intracranial: Negative Visualized orbits: Partly seen. No acute finding. Right cataract resection. Mastoids and visualized paranasal sinuses: Lobulated soft tissue in the left maxillary could be polyp or lobulated retention cyst. No destructive changes in the neighboring bone. Skeleton: Negative for acute or destructive finding. There are calcified disc bulges from C2-3 to C6-7, with ligamentum flavum thickening and calcification at C4-5. These cause cord deformity at C3-4 and C4-5. Patient has undergone laminectomy at C5-6. Upper chest: Left peritracheal soft tissue is likely multiple borderline enlarged lymph nodes rather than 1 large node or nodal conglomerate. These could be congestive given layering pleural effusions, small where seen. IMPRESSION: 1. Limited by patient motion and lack of IV contrast. No  specific explanation for acute neck pain. 2. Advanced cervical spine degeneration with calcified disc bulges and ligamentum flavum thickening. 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 3. Layering pleural effusions, small where seen. 4. Polypoid soft tissue in the left maxillary sinus, nonspecific without contrast. Electronically Signed   By: Monte Fantasia M.D.   On: 10/29/2016 18:39   Ct Chest Wo Contrast  Result Date: 11/19/2016 CLINICAL DATA:  Shortness of breath. EXAM: CT CHEST WITHOUT CONTRAST TECHNIQUE: Multidetector CT imaging of the chest was performed following the standard protocol without IV contrast. COMPARISON:  Chest x-ray from same day. CT chest dated October 31, 2016. FINDINGS: Cardiovascular: Mild cardiomegaly, unchanged. New small pericardial effusion. Coronary, aortic arch, and branch vessel atherosclerotic vascular disease. Mediastinum/Nodes: Prominent and mildly enlarged mediastinal lymph nodes are stable to slightly decreased in size. No axillary lymphadenopathy. The thyroid gland, trachea, and esophagus are unremarkable. Lungs/Pleura: Large right and small left pleural effusions. Right greater than left bibasilar atelectasis. Additional atelectasis in the right middle lobe. No suspicious pulmonary nodules. No consolidation or pneumothorax. Upper Abdomen: No acute abnormality. Musculoskeletal: No chest wall mass or suspicious bone lesions identified. No fracture. Unchanged degenerative changes of the thoracic spine, worst at T7-T8 and T10-T11. IMPRESSION: 1. Large right and small left pleural effusions with adjacent right greater than left atelectasis. 2. New small pericardial effusion. 3. Stable to slightly decreased in size mildly enlarged mediastinal lymph nodes, likely reactive. 4.  Aortic atherosclerosis (ICD10-I70.0). Electronically Signed   By: Titus Dubin M.D.   On: 11/19/2016 11:16   Ct Chest Wo Contrast  Result Date:  10/31/2016 CLINICAL DATA:  Shortness of breath with exertion EXAM: CT CHEST WITHOUT CONTRAST TECHNIQUE: Multidetector CT imaging of the chest was performed following the standard protocol without IV contrast. COMPARISON:  Chest x-ray 10/31/2016 FINDINGS: Cardiovascular: Aortic and coronary artery calcifications. No aneurysm. Mild cardiomegaly. Mediastinum/Nodes: Scattered borderline and mildly enlarged mediastinal lymph nodes. Index AP window lymph node has a short axis diameter of 18 mm. Index right paratracheal node has a short axis diameter of 9 mm. No axillary adenopathy. Lungs/Pleura: Large right pleural effusion and small left pleural effusion. Bibasilar atelectasis. Mild vascular congestion. Upper Abdomen: Imaging into the upper abdomen shows no acute findings. Musculoskeletal: Chest wall soft tissues are unremarkable. No acute bony abnormality. IMPRESSION: Large right pleural effusion and small left effusion.  Bibasilar atelectasis. Vascular congestion. Extensive coronary artery calcifications. Mildly enlarged mediastinal lymph nodes, likely reactive. Aortic Atherosclerosis (ICD10-I70.0). Electronically Signed   By: Rolm Baptise M.D.   On: 10/31/2016 07:08   Dg Chest Right Decubitus  Result Date: 11/19/2016 CLINICAL DATA:  Right pleural effusion. EXAM: CHEST - RIGHT DECUBITUS COMPARISON:  Chest x-ray from earlier today and CT of the chest November 19, 2016 FINDINGS: The right-side-down decubitus film demonstrates a layering right-sided effusion which correlates with comparison imaging. IMPRESSION: The right-sided pleural effusion layers on this study. Electronically Signed   By: Dorise Bullion III M.D   On: 11/19/2016 15:29   Nm Myocar Multi W/spect Tamela Oddi Motion / Ef  Result Date: 11/15/2016  There was no ST segment deviation noted during stress. T wave inversions inferiorly.  Defect 1: There is a large defect of severe severity present in the basal inferolateral, basal anterolateral, mid  anterior, mid anteroseptal, mid inferolateral, mid anterolateral, apical anterior, apical inferior, apical lateral and apex location.  This is a high risk study.  Findings consistent with prior myocardial infarction. No ischemic territories.  Nuclear stress EF: 21%.    Dg Chest Port 1 View  Result Date: 11/20/2016 CLINICAL DATA:  Shortness of breath EXAM: PORTABLE CHEST 1 VIEW COMPARISON:  11/19/2016 FINDINGS: Large right and small to moderate left pleural effusions causing hazy opacities obscuring the lung bases. Mild enlargement of the cardiopericardial silhouette. No findings of acute pulmonary edema. The right pleural effusion has a greater layering effect than on 11/19/2016, possibly due to lack of complete settling prior to imaging. IMPRESSION: 1. Similar appearance of large right and small to moderate left pleural effusions with passive atelectasis. 2. Mild enlargement of the cardiopericardial silhouette. Electronically Signed   By: Van Clines M.D.   On: 11/20/2016 12:09   Dg Chest Port 1 View  Result Date: 11/10/2016 CLINICAL DATA:  Shortness of breath tonight EXAM: PORTABLE CHEST 1 VIEW COMPARISON:  October 31, 2016 FINDINGS: The mediastinal contour is normal. The heart size is mildly enlarged. There is a small right pleural effusion with patchy consolidation of right lung base. The left lung is clear. No acute abnormalities identified in the visualized bones. IMPRESSION: Small right pleural effusion with patchy consolidation of right lung base, underline pneumonia is not excluded. Electronically Signed   By: Abelardo Diesel M.D.   On: 11/10/2016 21:28   US Thoracentesis Asp Pleural Space W/img Guide  Result Date: 11/21/2016 INDICATION: Right pleural effusion. EXAM: ULTRASOUND GUIDED RIGHT THORACENTESIS MEDICATIONS: None. COMPLICATIONS: None immediate. PROCEDURE: An ultrasound guided thoracentesis was thoroughly discussed with the patient and questions answered. The benefits, risks,  alternatives and complications were also discussed. The patient understands and wishes to proceed with the procedure. Written consent was obtained. Ultrasound was performed to localize and mark an adequate pocket of fluid in the RIGHT chest. The area was then prepped and draped in the normal sterile fashion. 1% Lidocaine was used for local anesthesia. Under ultrasound guidance a Yueh catheter was introduced. Thoracentesis was performed. The catheter was removed and a dressing applied. FINDINGS: A total of approximately 1.7 L of dark blood-tinged fluid was removed. Samples were sent to the laboratory as requested by the clinical team. IMPRESSION: Successful ultrasound guided right thoracentesis yielding 1.7 L of pleural fluid. Electronically Signed   By: Lorriane Shire M.D.   On: 11/21/2016 13:09   US Thoracentesis Asp Pleural Space W/img Guide  Result Date: 10/31/2016 INDICATION: Right pleural effusion. EXAM: ULTRASOUND GUIDED RIGHT THORACENTESIS MEDICATIONS: None.  COMPLICATIONS: None immediate. PROCEDURE: An ultrasound guided thoracentesis was thoroughly discussed with the patient and questions answered. The benefits, risks, alternatives and complications were also discussed. The patient understands and wishes to proceed with the procedure. Written consent was obtained. Ultrasound was performed to localize and mark an adequate pocket of fluid in the right chest. The area was then prepped and draped in the normal sterile fashion. 1% Lidocaine was used for local anesthesia. Under ultrasound guidance a Yuen catheter was introduced. Thoracentesis was performed. The catheter was removed and a dressing applied. FINDINGS: A total of approximately 1 L of blood-tinged fluid was removed. Samples were sent to the laboratory as requested by the clinical team. IMPRESSION: Successful ultrasound guided right thoracentesis yielding 1 L of pleural fluid. The patient tolerated the procedure well. No immediate complications.  Electronically Signed   By: Lorriane Shire M.D.   On: 10/31/2016 13:09     CBC  Recent Labs Lab 11/16/16 0613 11/19/16 0815 11/21/16 0452  WBC 8.1 7.6 6.9  HGB 8.8* 8.1* 8.6*  HCT 27.7* 25.0* 27.1*  PLT 354 372 412*  MCV 96.2 95.1 97.1  MCH 30.6 30.8 30.8  MCHC 31.8 32.4 31.7  RDW 16.8* 16.9* 17.4*  LYMPHSABS  --  0.4*  --   MONOABS  --  0.7  --   EOSABS  --  0.0  --   BASOSABS  --  0.0  --     Chemistries   Recent Labs Lab 11/16/16 0613 11/19/16 0815 11/20/16 1018 11/21/16 0452  NA 134* 134* 135 138  K 3.9 4.3 4.2 3.9  CL 98* 96* 100* 101  CO2 24 25 23 26   GLUCOSE 182* 181* 146* 167*  BUN 62* 67* 62* 60*  CREATININE 2.81* 3.00* 2.80* 2.60*  CALCIUM 9.1 9.0 8.8* 8.6*  AST  --  62*  --  40  ALT  --  54  --  40  ALKPHOS  --  164*  --  153*  BILITOT  --  0.4  --  0.5   ------------------------------------------------------------------------------------------------------------------ estimated creatinine clearance is 24.1 mL/min (A) (by C-G formula based on SCr of 2.6 mg/dL (H)). ------------------------------------------------------------------------------------------------------------------ No results for input(s): HGBA1C in the last 72 hours. ------------------------------------------------------------------------------------------------------------------ No results for input(s): CHOL, HDL, LDLCALC, TRIG, CHOLHDL, LDLDIRECT in the last 72 hours. ------------------------------------------------------------------------------------------------------------------ No results for input(s): TSH, T4TOTAL, T3FREE, THYROIDAB in the last 72 hours.  Invalid input(s): FREET3 ------------------------------------------------------------------------------------------------------------------ No results for input(s): VITAMINB12, FOLATE, FERRITIN, TIBC, IRON, RETICCTPCT in the last 72 hours.  Coagulation profile  Recent Labs Lab 11/16/16 0613 11/19/16 0815 11/20/16 0426  11/21/16 0452 11/22/16 0619  INR 1.48 4.10* 3.15 2.89 3.10    No results for input(s): DDIMER in the last 72 hours.  Cardiac Enzymes No results for input(s): CKMB, TROPONINI, MYOGLOBIN in the last 168 hours.  Invalid input(s): CK ------------------------------------------------------------------------------------------------------------------ Invalid input(s): POCBNP   CBG:  Recent Labs Lab 11/21/16 1141 11/21/16 1610 11/21/16 2137 11/22/16 0813 11/22/16 0919  GLUCAP 160* 200* 161* 43* 106*       Studies: Dg Chest 1 View  Result Date: 11/21/2016 CLINICAL DATA:  Pleural effusions, right greater than left. EXAM: CHEST 1 VIEW COMPARISON:  11/20/2016 FINDINGS: Right pleural effusion has been eliminated. There is a moderate left effusion which appears slightly more prominent than on the prior study. Overall heart size and pulmonary vascularity are normal. No pneumothorax after thoracentesis. IMPRESSION: No pneumothorax after right thoracentesis. No visible residual right pleural fluid. Moderate left effusion appears slightly increased.  Electronically Signed   By: Lorriane Shire M.D.   On: 11/21/2016 13:08   Dg Chest Port 1 View  Result Date: 11/20/2016 CLINICAL DATA:  Shortness of breath EXAM: PORTABLE CHEST 1 VIEW COMPARISON:  11/19/2016 FINDINGS: Large right and small to moderate left pleural effusions causing hazy opacities obscuring the lung bases. Mild enlargement of the cardiopericardial silhouette. No findings of acute pulmonary edema. The right pleural effusion has a greater layering effect than on 11/19/2016, possibly due to lack of complete settling prior to imaging. IMPRESSION: 1. Similar appearance of large right and small to moderate left pleural effusions with passive atelectasis. 2. Mild enlargement of the cardiopericardial silhouette. Electronically Signed   By: Van Clines M.D.   On: 11/20/2016 12:09   US Thoracentesis Asp Pleural Space W/img Guide  Result  Date: 11/21/2016 INDICATION: Right pleural effusion. EXAM: ULTRASOUND GUIDED RIGHT THORACENTESIS MEDICATIONS: None. COMPLICATIONS: None immediate. PROCEDURE: An ultrasound guided thoracentesis was thoroughly discussed with the patient and questions answered. The benefits, risks, alternatives and complications were also discussed. The patient understands and wishes to proceed with the procedure. Written consent was obtained. Ultrasound was performed to localize and mark an adequate pocket of fluid in the RIGHT chest. The area was then prepped and draped in the normal sterile fashion. 1% Lidocaine was used for local anesthesia. Under ultrasound guidance a Yueh catheter was introduced. Thoracentesis was performed. The catheter was removed and a dressing applied. FINDINGS: A total of approximately 1.7 L of dark blood-tinged fluid was removed. Samples were sent to the laboratory as requested by the clinical team. IMPRESSION: Successful ultrasound guided right thoracentesis yielding 1.7 L of pleural fluid. Electronically Signed   By: Lorriane Shire M.D.   On: 11/21/2016 13:09      Lab Results  Component Value Date   HGBA1C 8.8 (H) 10/31/2016   HGBA1C 9.6 09/28/2016   HGBA1C 10.3 (H) 07/09/2016   Lab Results  Component Value Date   LDLCALC 96 07/07/2015   CREATININE 2.60 (H) 11/21/2016       Scheduled Meds: . cyclobenzaprine  5 mg Oral QHS  . fluticasone  1 spray Each Nare Daily  . hydrALAZINE  25 mg Oral Q8H  . insulin aspart  0-9 Units Subcutaneous TID WC  . insulin glargine  30 Units Subcutaneous QHS  . isosorbide mononitrate  60 mg Oral Daily  . metoprolol succinate  75 mg Oral Q breakfast  . pantoprazole  40 mg Oral Daily  . polyethylene glycol  17 g Oral Daily  . polyethylene glycol  17 g Oral Daily  . rosuvastatin  40 mg Oral QHS  . ticagrelor  60 mg Oral BID  . torsemide  60 mg Oral Daily  . Warfarin - Pharmacist Dosing Inpatient   Does not apply q1800   Continuous Infusions: .  ceFEPime (MAXIPIME) IV 1 g (11/22/16 0918)  . vancomycin Stopped (11/21/16 1633)     LOS: 3 days    Reyne Dumas  [facility] Pager [phone]. If 7PM-7AM, please contact night-coverage at [email or web address], password [facility] 11/22/2016, 9:45 AM  LOS: 3 days

## 2016-11-22 NOTE — Progress Notes (Signed)
Pharmacy Antibiotic Note  [name] is a 71 y.o. female admitted on 11/19/2016 with pneumonia.  Pharmacy has been consulted for Vancomycin and Cefepime dosing.  Plan: Vancomycin 1500mg  loading dose, then 1000mg  IV every 24 hours.  Goal trough 15-20 mcg/mL.  Continue Cefepime 1gm IV q24h F/U cxs and clinical progress Monitor V/S, labs and levels as indicated  Height: 5\' 8"  (172.7 cm) Weight: 212 lb 8.4 oz (96.4 kg) IBW/kg (Calculated) : 63.9  Temp (24hrs), Avg:98.6 F (37 C), Min:98.3 F (36.8 C), Max:99.3 F (37.4 C)   Recent Labs Lab 11/16/16 0613 11/19/16 0815 11/20/16 1018 11/21/16 0452 11/22/16 0934  WBC 8.1 7.6  --  6.9  --   CREATININE 2.81* 3.00* 2.80* 2.60* 2.41*    Normalized CrCl is 67mls/min Estimated Creatinine Clearance: 26 mL/min (A) (by C-G formula based on SCr of 2.41 mg/dL (H)).    Allergies  Allergen Reactions  . Ace Inhibitors Cough  . Amlodipine     Edema   . Codeine Rash   Antimicrobials this admission: Vancomycin 9/15 >>  Cefepime 9/15 >>   Dose adjustments this admission: n/a  Microbiology results: 9/15 BCx: pending 9/7 MRSA PCR: neg  Thank you for allowing pharmacy to be a part of this patient's care.  Hart Robinsons, PharmD Clinical Pharmacist Pager:  [phone] 11/22/2016   11/22/2016 11:19 AM

## 2016-11-23 ENCOUNTER — Encounter (HOSPITAL_COMMUNITY): Payer: Self-pay | Admitting: Cardiology

## 2016-11-23 ENCOUNTER — Ambulatory Visit: Payer: BLUE CROSS/BLUE SHIELD | Admitting: Cardiovascular Disease

## 2016-11-23 DIAGNOSIS — I4891 Unspecified atrial fibrillation: Secondary | ICD-10-CM

## 2016-11-23 DIAGNOSIS — E876 Hypokalemia: Secondary | ICD-10-CM

## 2016-11-23 DIAGNOSIS — I5023 Acute on chronic systolic (congestive) heart failure: Secondary | ICD-10-CM

## 2016-11-23 DIAGNOSIS — I255 Ischemic cardiomyopathy: Secondary | ICD-10-CM

## 2016-11-23 DIAGNOSIS — N184 Chronic kidney disease, stage 4 (severe): Secondary | ICD-10-CM

## 2016-11-23 LAB — GLUCOSE, CAPILLARY
Glucose-Capillary: 81 mg/dL (ref 65–99)
Glucose-Capillary: 114 mg/dL — ABNORMAL HIGH (ref 65–99)
Glucose-Capillary: 245 mg/dL — ABNORMAL HIGH (ref 65–99)
Glucose-Capillary: 258 mg/dL — ABNORMAL HIGH (ref 65–99)
Glucose-Capillary: 317 mg/dL — ABNORMAL HIGH (ref 65–99)

## 2016-11-23 LAB — PHOSPHORUS: PHOSPHORUS: 2.8 mg/dL (ref 2.5–4.6)

## 2016-11-23 LAB — MAGNESIUM: Magnesium: 2 mg/dL (ref 1.7–2.4)

## 2016-11-23 LAB — BASIC METABOLIC PANEL
Anion gap: 8 (ref 5–15)
BUN: 48 mg/dL — AB (ref 6–20)
CHLORIDE: 102 mmol/L (ref 101–111)
CO2: 28 mmol/L (ref 22–32)
Calcium: 8.3 mg/dL — ABNORMAL LOW (ref 8.9–10.3)
Creatinine, Ser: 2.24 mg/dL — ABNORMAL HIGH (ref 0.44–1.00)
GFR calc Af Amer: 24 mL/min — ABNORMAL LOW (ref >60)
GFR calc non Af Amer: 21 mL/min — ABNORMAL LOW (ref >60)
GLUCOSE: 105 mg/dL — AB (ref 65–99)
POTASSIUM: 3.1 mmol/L — AB (ref 3.5–5.1)
Sodium: 138 mmol/L (ref 135–145)

## 2016-11-23 LAB — GRAM STAIN: Gram Stain: NONE SEEN

## 2016-11-23 LAB — PROTIME-INR
INR: 2.38
Prothrombin Time: 25.8 seconds — ABNORMAL HIGH (ref 11.4–15.2)

## 2016-11-23 LAB — TROPONIN I

## 2016-11-23 MED ORDER — WARFARIN SODIUM 5 MG PO TABS
5.0000 mg | ORAL_TABLET | Freq: Once | ORAL | Status: AC
  Administered 2016-11-23: 5 mg via ORAL
  Filled 2016-11-23: qty 1

## 2016-11-23 MED ORDER — AMIODARONE HCL 200 MG PO TABS
200.0000 mg | ORAL_TABLET | Freq: Two times a day (BID) | ORAL | Status: DC
  Administered 2016-11-23 – 2016-11-27 (×9): 200 mg via ORAL
  Filled 2016-11-23 (×9): qty 1

## 2016-11-23 MED ORDER — DILTIAZEM HCL 25 MG/5ML IV SOLN
20.0000 mg | Freq: Once | INTRAVENOUS | Status: AC | PRN
  Administered 2016-11-23: 20 mg via INTRAVENOUS
  Filled 2016-11-23: qty 5

## 2016-11-23 MED ORDER — CEFTRIAXONE SODIUM 1 G IJ SOLR: 1.0000 g | INTRAMUSCULAR | Status: DC

## 2016-11-23 MED ORDER — FUROSEMIDE 10 MG/ML IJ SOLN
40.0000 mg | Freq: Two times a day (BID) | INTRAMUSCULAR | Status: DC
  Administered 2016-11-23 – 2016-11-24 (×2): 40 mg via INTRAVENOUS
  Filled 2016-11-23 (×2): qty 4

## 2016-11-23 MED ORDER — DILTIAZEM HCL 25 MG/5ML IV SOLN
15.0000 mg | Freq: Once | INTRAVENOUS | Status: AC | PRN
  Administered 2016-11-23: 15 mg via INTRAVENOUS
  Filled 2016-11-23: qty 5

## 2016-11-23 MED ORDER — ISOSORBIDE MONONITRATE ER 60 MG PO TB24
30.0000 mg | ORAL_TABLET | Freq: Every day | ORAL | Status: DC
  Administered 2016-11-23 – 2016-11-27 (×5): 30 mg via ORAL
  Filled 2016-11-23 (×4): qty 1

## 2016-11-23 MED ORDER — POTASSIUM CHLORIDE CRYS ER 20 MEQ PO TBCR
40.0000 meq | EXTENDED_RELEASE_TABLET | ORAL | Status: AC
  Administered 2016-11-23 (×2): 40 meq via ORAL
  Filled 2016-11-23 (×2): qty 2

## 2016-11-23 MED ORDER — FUROSEMIDE 10 MG/ML IJ SOLN
40.0000 mg | Freq: Two times a day (BID) | INTRAMUSCULAR | Status: DC
  Administered 2016-11-23: 40 mg via INTRAVENOUS
  Filled 2016-11-23: qty 4

## 2016-11-23 MED ORDER — WARFARIN - PHARMACIST DOSING INPATIENT
Status: DC
  Administered 2016-11-24 – 2016-11-27 (×3)

## 2016-11-23 MED ORDER — FUROSEMIDE 10 MG/ML IJ SOLN: 60.0000 mg | Freq: Two times a day (BID) | INTRAMUSCULAR | Status: DC

## 2016-11-23 MED ORDER — METOPROLOL SUCCINATE ER 50 MG PO TB24
100.0000 mg | ORAL_TABLET | Freq: Every day | ORAL | Status: DC
  Administered 2016-11-23 – 2016-11-27 (×5): 100 mg via ORAL
  Filled 2016-11-23 (×6): qty 2

## 2016-11-23 NOTE — Progress Notes (Signed)
RT at bedside doing EKG.

## 2016-11-23 NOTE — Progress Notes (Signed)
Pt states she felt like she wasn't getting enough oxygen, oxygen saturation 99% on RA when vital signs were taken. Pt placed on 2L o2 per [place] d/t increased HR and pt feeling air hungry. Will continue to monitor pt

## 2016-11-23 NOTE — Progress Notes (Signed)
PROGRESS NOTE                                                                                                                                                                                                             Patient Demographics:    Nimsi Males, is a 71 y.o. female, DOB - 12-11-1945, WER:[id number]  Admit date - 11/19/2016   Admitting Physician Reyne Dumas, MD  Outpatient Primary MD for the patient is [name], MD  LOS - 4  Outpatient Specialists:Dr. Berneta Sages  Chief Complaint  Patient presents with  . Weakness  . Shortness of Breath       Brief Narrative  71 year old female with history of coronary artery disease with stenting, chronic combined systolic and diastolic CHF, right subclavian artery occlusion with thrombectomy in 07/2015 and in 09/2016, CK D stage IV, NSTEMI, uncontrolled type 2 diabetes mellitus, who was hospitalized from 9/6-9/12 with new onset A. fib and worsened heart failure. Patient was discharged home on anticoagulation on 9/12 after symptoms improved but returned back on 9/15 with A. fib with RVR and worsened renal function and worsened right pleural effusion. Patient placed in stepdown unit and underwent thoracentesis on 9/17. Cardiology consulted for CHF and persistent A. fib.   Subjective:  Heart rate again increased to 120s/130s overnight. Patient again feels short of breath this morning.   Assessment  & Plan :   Principal problem Atrial fibrillation with RVR Toprol dose increased 200 mg daily. Hydralazine and Imdur dose reduced due to soft blood pressure. Cardiology consult appreciated and given her recurrent atrial fibrillation started on amiodarone. -Started on Coumadin recently. CHADS2vasc of 5. INR therapeutic.  Acute on chronic systolic and diastolic CHF Demadex switched to IV Lasix 40 mg twice a day. Continue hydralazine and Imdur at lower dose. Increased Toprol  dose 200 mg daily. 2-D echo this admission with EF of 30-35% with grade 2 diastolic dysfunction. Monitor strict I/O and daily weight. Continue O2 as needed. Underwent right thoracentesis on 9/17 with improvement in follow-up chest x-ray.  Acute on chronic kidney disease stage III Baseline creatinine around 2. Presented with worsening creatinine of 3. Suspect cardiorenal syndrome. Monitor on IV Lasix.  Coronary artery disease  History of stenting to mid RCA and distal RCA/PDA Ranexa stopped in the setting of worsened renal function. Recent Lexiscan Myoview from 11/15/2016  shows large area of scar without ischemia. Continue medical management.   Subclavian artery occlusion Status post thrombectomy in July. Continue aspirin and [place] tie  Escherichia coli UTI Completed treatment during previous admission.  Anemia of chronic disease Stable.  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 A1c of 8.8. Lantus dose reduced due to hypoglycemia on 9/18.   Right pleural effusion Suspected due to CHF. Cytology negative for malignancy (shows reactive mesothelial cells only). Will discontinue antibiotics.  Hypokalemia Replenished    Code Status : Full code  Family Communication  : None at bedside  Disposition Plan  : Home once CHF and heart rate improved  Barriers For Discharge : Active symptoms  Consults  :  Cardiology  Procedures  :  2-D echo Right thoracentesis  DVT Prophylaxis  :  Coumadin  Lab Results  Component Value Date   PLT 412 (H) 11/21/2016    Antibiotics  :    Anti-infectives    Start     Dose/Rate Route Frequency Ordered Stop   11/20/16 1100  vancomycin (VANCOCIN) IVPB 1000 mg/200 mL premix     1,000 mg 200 mL/hr over 60 Minutes Intravenous Every 24 hours 11/19/16 1211     11/20/16 1000  ceFEPIme (MAXIPIME) 1 g in dextrose 5 % 50 mL IVPB     1 g 100 mL/hr over 30 Minutes Intravenous Every 24 hours 11/19/16 1211     11/20/16  0000  vancomycin (VANCOCIN) 1,500 mg in sodium chloride 0.9 % 500 mL IVPB  Status:  Discontinued     1,500 mg 250 mL/hr over 120 Minutes Intravenous Every 12 hours 11/19/16 1205 11/19/16 1211   11/19/16 1215  ceFEPIme (MAXIPIME) 2 g in dextrose 5 % 50 mL IVPB  Status:  Discontinued     2 g 100 mL/hr over 30 Minutes Intravenous  Once 11/19/16 1205 11/19/16 1211   11/19/16 1015  vancomycin (VANCOCIN) 1,500 mg in sodium chloride 0.9 % 500 mL IVPB     1,500 mg 250 mL/hr over 120 Minutes Intravenous  Once 11/19/16 1000 11/19/16 1245   11/19/16 0945  ceFEPIme (MAXIPIME) 2 g in dextrose 5 % 50 mL IVPB     2 g 100 mL/hr over 30 Minutes Intravenous  Once 11/19/16 0944 11/19/16 1041   11/19/16 0945  vancomycin (VANCOCIN) IVPB 1000 mg/200 mL premix  Status:  Discontinued     1,000 mg 200 mL/hr over 60 Minutes Intravenous  Once 11/19/16 0944 11/19/16 1000        Objective:   Vitals:   11/22/16 1200 11/22/16 2022 11/22/16 2039 11/23/16 0504  BP:   (!) 154/66 102/69  Pulse:   86 99  Resp:   18 18  Temp: 98.6 F (37 C)  98.8 F (37.1 C) 98.7 F (37.1 C)  TempSrc: Oral  Oral Oral  SpO2:  95% 100% 99%  Weight:      Height:        Wt Readings from Last 3 Encounters:  11/22/16 96.4 kg (212 lb 8.4 oz)  11/16/16 94.5 kg (208 lb 6.4 oz)  10/31/16 91.4 kg (201 lb 8 oz)     Intake/Output Summary (Last 24 hours) at 11/23/16 1420 Last data filed at 11/23/16 0900  Gross per 24 hour  Intake              940 ml  Output             1200 ml  Net             -  260 ml     Physical Exam  Gen: not in distress, Appears fatigued HEENT: Pallor present, moist mucosa, supple neck, JVD + Chest: Diminished bibasilar breath sounds (R>L) CVS: S1 and S2 irregular, no murmurs GI: soft, NT, ND,  Musculoskeletal: warm, 1+ eating edema bilaterally     Data Review:    CBC  Recent Labs Lab 11/19/16 0815 11/21/16 0452  WBC 7.6 6.9  HGB 8.1* 8.6*  HCT 25.0* 27.1*  PLT 372 412*  MCV 95.1 97.1    MCH 30.8 30.8  MCHC 32.4 31.7  RDW 16.9* 17.4*  LYMPHSABS 0.4*  --   MONOABS 0.7  --   EOSABS 0.0  --   BASOSABS 0.0  --     Chemistries   Recent Labs Lab 11/19/16 0815 11/20/16 1018 11/21/16 0452 11/22/16 0934 11/23/16 0536  NA 134* 135 138 139 138  K 4.3 4.2 3.9 3.5 3.1*  CL 96* 100* 101 102 102  CO2 25 23 26 28 28   GLUCOSE 181* 146* 167* 106* 105*  BUN 67* 62* 60* 50* 48*  CREATININE 3.00* 2.80* 2.60* 2.41* 2.24*  CALCIUM 9.0 8.8* 8.6* 8.5* 8.3*  MG  --   --   --   --  2.0  AST 62*  --  40 29  --   ALT 54  --  40 32  --   ALKPHOS 164*  --  153* 144*  --   BILITOT 0.4  --  0.5 0.3  --    ------------------------------------------------------------------------------------------------------------------ No results for input(s): CHOL, HDL, LDLCALC, TRIG, CHOLHDL, LDLDIRECT in the last 72 hours.  Lab Results  Component Value Date   HGBA1C 8.8 (H) 10/31/2016   ------------------------------------------------------------------------------------------------------------------ No results for input(s): TSH, T4TOTAL, T3FREE, THYROIDAB in the last 72 hours.  Invalid input(s): FREET3 ------------------------------------------------------------------------------------------------------------------ No results for input(s): VITAMINB12, FOLATE, FERRITIN, TIBC, IRON, RETICCTPCT in the last 72 hours.  Coagulation profile  Recent Labs Lab 11/19/16 0815 11/20/16 0426 11/21/16 0452 11/22/16 0619 11/23/16 0536  INR 4.10* 3.15 2.89 3.10 2.38    No results for input(s): DDIMER in the last 72 hours.  Cardiac Enzymes  Recent Labs Lab 11/23/16 0536  TROPONINI <0.03   ------------------------------------------------------------------------------------------------------------------    Component Value Date/Time   BNP 648.0 (H) 10/31/2016 0528    Inpatient Medications  Scheduled Meds: . amiodarone  200 mg Oral BID  . cyclobenzaprine  5 mg Oral QHS  . fluticasone  1  spray Each Nare Daily  . furosemide  40 mg Intravenous BID  . hydrALAZINE  25 mg Oral Q8H  . insulin aspart  0-9 Units Subcutaneous TID WC  . insulin glargine  28 Units Subcutaneous QHS  . isosorbide mononitrate  30 mg Oral Daily  . metoprolol succinate  100 mg Oral Q breakfast  . pantoprazole  40 mg Oral Daily  . polyethylene glycol  17 g Oral Daily  . polyethylene glycol  17 g Oral Daily  . rosuvastatin  40 mg Oral QHS  . ticagrelor  60 mg Oral BID  . warfarin  5 mg Oral Once  . [START ON 11/24/2016] Warfarin - Pharmacist Dosing Inpatient   Does not apply Q24H   Continuous Infusions: . ceFEPime (MAXIPIME) IV Stopped (11/23/16 1146)  . vancomycin Stopped (11/23/16 1258)   PRN Meds:.acetaminophen, albuterol, ALPRAZolam, diazepam, guaiFENesin-dextromethorphan, ondansetron (ZOFRAN) IV  Micro Results Recent Results (from the past 240 hour(s))  Gram stain     Status: None   Collection Time: 11/21/16 12:15 PM  Result Value  Ref Range Status   Specimen Description PLEURAL  Final   Special Requests NONE  Final   Gram Stain NO ORGANISMS SEEN PERFORMED AT [facility]   Final   Report Status 11/21/2016 FINAL  Final  Culture, body fluid-bottle     Status: None (Preliminary result)   Collection Time: 11/21/16 12:15 PM  Result Value Ref Range Status   Specimen Description PLEURAL  Final   Special Requests BOTTLES DRAWN AEROBIC AND ANAEROBIC 10 CC EACH  Final   Culture NO GROWTH 2 DAYS  Final   Report Status PENDING  Incomplete    Radiology Reports Dg Chest 1 View  Result Date: 11/21/2016 CLINICAL DATA:  Pleural effusions, right greater than left. EXAM: CHEST 1 VIEW COMPARISON:  11/20/2016 FINDINGS: Right pleural effusion has been eliminated. There is a moderate left effusion which appears slightly more prominent than on the prior study. Overall heart size and pulmonary vascularity are normal. No pneumothorax after thoracentesis. IMPRESSION: No pneumothorax after right thoracentesis. No visible  residual right pleural fluid. Moderate left effusion appears slightly increased. Electronically Signed   By: Lorriane Shire M.D.   On: 11/21/2016 13:08   Dg Chest 1 View  Result Date: 10/31/2016 CLINICAL DATA:  Status post right thoracentesis. EXAM: CHEST 1 VIEW COMPARISON:  10/31/2016. FINDINGS: Stable enlarged cardiac silhouette. Minimal left lower lobe atelectasis with improvement. Minimal bilateral pleural fluid, mildly improved on the left and significantly improved on the right following thoracentesis. No pneumothorax. Thoracic spine degenerative changes. IMPRESSION: 1. Almost completely resolved right pleural fluid following thoracentesis without pneumothorax. 2. Small left pleural effusion with improvement with decreased left basilar atelectasis. Electronically Signed   By: Claudie Revering M.D.   On: 10/31/2016 13:30   Dg Chest 2 View  Result Date: 11/19/2016 CLINICAL DATA:  Short of breath. Discharged 3 days ago with congestive heart failure. EXAM: CHEST  2 VIEW COMPARISON:  11/10/2016 FINDINGS: Degraded lateral view secondary to positioning and overlying artifact. Midline trachea. Normal heart size for level of inspiration. Increase in moderate right pleural effusion. No pneumothorax. Low lung volumes. Mild pulmonary venous congestion. Worsened right and developing mild left base airspace disease. IMPRESSION: Increase in moderate right pleural effusion with adjacent atelectasis or infection. Mild pulmonary venous congestion with minimal left base atelectasis. Electronically Signed   By: Abigail Miyamoto M.D.   On: 11/19/2016 09:08   Dg Chest 2 View  Result Date: 10/31/2016 CLINICAL DATA:  Chest pain and shortness of breath.  Cough. EXAM: CHEST  2 VIEW COMPARISON:  08/07/2016 FINDINGS: There is cardiomegaly. Bilateral pleural effusions, moderate on the right and small on the left. There is vascular congestion and peribronchial cuffing suggesting pulmonary edema. No pneumothorax. IMPRESSION:  Cardiomegaly with bilateral pleural effusions and vascular congestion. Peribronchial cuffing may reflect pulmonary edema or bronchial inflammation. Electronically Signed   By: Jeb Levering M.D.   On: 10/31/2016 06:11   Ct Soft Tissue Neck Wo Contrast  Result Date: 10/29/2016 CLINICAL DATA:  Left-sided neck pain after doing exercises in cardiac rehab last week. EXAM: CT NECK WITHOUT CONTRAST TECHNIQUE: Multidetector CT imaging of the neck was performed following the standard protocol without intravenous contrast. COMPARISON:  None. FINDINGS: Pharynx and larynx: No noted mass or inflammation. Limited at the level of the larynx due to patient motion. Salivary glands: No inflammation, mass, or stone. Thyroid: Normal. Lymph nodes: No enlargement or abnormal density of cervical lymph nodes. Vascular: Limited without contrast. Moderate calcified plaque at the carotid bifurcations. Limited intracranial: Negative Visualized orbits:  Partly seen. No acute finding. Right cataract resection. Mastoids and visualized paranasal sinuses: Lobulated soft tissue in the left maxillary could be polyp or lobulated retention cyst. No destructive changes in the neighboring bone. Skeleton: Negative for acute or destructive finding. There are calcified disc bulges from C2-3 to C6-7, with ligamentum flavum thickening and calcification at C4-5. These cause cord deformity at C3-4 and C4-5. Patient has undergone laminectomy at C5-6. Upper chest: Left peritracheal soft tissue is likely multiple borderline enlarged lymph nodes rather than 1 large node or nodal conglomerate. These could be congestive given layering pleural effusions, small where seen. IMPRESSION: 1. Limited by patient motion and lack of IV contrast. No specific explanation for acute neck pain. 2. Advanced cervical spine degeneration with calcified disc bulges and ligamentum flavum thickening. There is multilevel spinal stenosis with cord impingement that is prominent at C3-4  and C4-5. 3. Layering pleural effusions, small where seen. 4. Polypoid soft tissue in the left maxillary sinus, nonspecific without contrast. Electronically Signed   By: Monte Fantasia M.D.   On: 10/29/2016 18:39   Ct Chest Wo Contrast  Result Date: 11/19/2016 CLINICAL DATA:  Shortness of breath. EXAM: CT CHEST WITHOUT CONTRAST TECHNIQUE: Multidetector CT imaging of the chest was performed following the standard protocol without IV contrast. COMPARISON:  Chest x-ray from same day. CT chest dated October 31, 2016. FINDINGS: Cardiovascular: Mild cardiomegaly, unchanged. New small pericardial effusion. Coronary, aortic arch, and branch vessel atherosclerotic vascular disease. Mediastinum/Nodes: Prominent and mildly enlarged mediastinal lymph nodes are stable to slightly decreased in size. No axillary lymphadenopathy. The thyroid gland, trachea, and esophagus are unremarkable. Lungs/Pleura: Large right and small left pleural effusions. Right greater than left bibasilar atelectasis. Additional atelectasis in the right middle lobe. No suspicious pulmonary nodules. No consolidation or pneumothorax. Upper Abdomen: No acute abnormality. Musculoskeletal: No chest wall mass or suspicious bone lesions identified. No fracture. Unchanged degenerative changes of the thoracic spine, worst at T7-T8 and T10-T11. IMPRESSION: 1. Large right and small left pleural effusions with adjacent right greater than left atelectasis. 2. New small pericardial effusion. 3. Stable to slightly decreased in size mildly enlarged mediastinal lymph nodes, likely reactive. 4.  Aortic atherosclerosis (ICD10-I70.0). Electronically Signed   By: Titus Dubin M.D.   On: 11/19/2016 11:16   Ct Chest Wo Contrast  Result Date: 10/31/2016 CLINICAL DATA:  Shortness of breath with exertion EXAM: CT CHEST WITHOUT CONTRAST TECHNIQUE: Multidetector CT imaging of the chest was performed following the standard protocol without IV contrast. COMPARISON:  Chest  x-ray 10/31/2016 FINDINGS: Cardiovascular: Aortic and coronary artery calcifications. No aneurysm. Mild cardiomegaly. Mediastinum/Nodes: Scattered borderline and mildly enlarged mediastinal lymph nodes. Index AP window lymph node has a short axis diameter of 18 mm. Index right paratracheal node has a short axis diameter of 9 mm. No axillary adenopathy. Lungs/Pleura: Large right pleural effusion and small left pleural effusion. Bibasilar atelectasis. Mild vascular congestion. Upper Abdomen: Imaging into the upper abdomen shows no acute findings. Musculoskeletal: Chest wall soft tissues are unremarkable. No acute bony abnormality. IMPRESSION: Large right pleural effusion and small left effusion. Bibasilar atelectasis. Vascular congestion. Extensive coronary artery calcifications. Mildly enlarged mediastinal lymph nodes, likely reactive. Aortic Atherosclerosis (ICD10-I70.0). Electronically Signed   By: Rolm Baptise M.D.   On: 10/31/2016 07:08   Dg Chest Right Decubitus  Result Date: 11/19/2016 CLINICAL DATA:  Right pleural effusion. EXAM: CHEST - RIGHT DECUBITUS COMPARISON:  Chest x-ray from earlier today and CT of the chest November 19, 2016 FINDINGS: The right-side-down  decubitus film demonstrates a layering right-sided effusion which correlates with comparison imaging. IMPRESSION: The right-sided pleural effusion layers on this study. Electronically Signed   By: Dorise Bullion III M.D   On: 11/19/2016 15:29   Nm Myocar Multi W/spect Tamela Oddi Motion / Ef  Result Date: 11/15/2016  There was no ST segment deviation noted during stress. T wave inversions inferiorly.  Defect 1: There is a large defect of severe severity present in the basal inferolateral, basal anterolateral, mid anterior, mid anteroseptal, mid inferolateral, mid anterolateral, apical anterior, apical inferior, apical lateral and apex location.  This is a high risk study.  Findings consistent with prior myocardial infarction. No ischemic  territories.  Nuclear stress EF: 21%.    Dg Chest Port 1 View  Result Date: 11/20/2016 CLINICAL DATA:  Shortness of breath EXAM: PORTABLE CHEST 1 VIEW COMPARISON:  11/19/2016 FINDINGS: Large right and small to moderate left pleural effusions causing hazy opacities obscuring the lung bases. Mild enlargement of the cardiopericardial silhouette. No findings of acute pulmonary edema. The right pleural effusion has a greater layering effect than on 11/19/2016, possibly due to lack of complete settling prior to imaging. IMPRESSION: 1. Similar appearance of large right and small to moderate left pleural effusions with passive atelectasis. 2. Mild enlargement of the cardiopericardial silhouette. Electronically Signed   By: Van Clines M.D.   On: 11/20/2016 12:09   Dg Chest Port 1 View  Result Date: 11/10/2016 CLINICAL DATA:  Shortness of breath tonight EXAM: PORTABLE CHEST 1 VIEW COMPARISON:  October 31, 2016 FINDINGS: The mediastinal contour is normal. The heart size is mildly enlarged. There is a small right pleural effusion with patchy consolidation of right lung base. The left lung is clear. No acute abnormalities identified in the visualized bones. IMPRESSION: Small right pleural effusion with patchy consolidation of right lung base, underline pneumonia is not excluded. Electronically Signed   By: Abelardo Diesel M.D.   On: 11/10/2016 21:28   US Thoracentesis Asp Pleural Space W/img Guide  Result Date: 11/21/2016 INDICATION: Right pleural effusion. EXAM: ULTRASOUND GUIDED RIGHT THORACENTESIS MEDICATIONS: None. COMPLICATIONS: None immediate. PROCEDURE: An ultrasound guided thoracentesis was thoroughly discussed with the patient and questions answered. The benefits, risks, alternatives and complications were also discussed. The patient understands and wishes to proceed with the procedure. Written consent was obtained. Ultrasound was performed to localize and mark an adequate pocket of fluid in the RIGHT  chest. The area was then prepped and draped in the normal sterile fashion. 1% Lidocaine was used for local anesthesia. Under ultrasound guidance a Yueh catheter was introduced. Thoracentesis was performed. The catheter was removed and a dressing applied. FINDINGS: A total of approximately 1.7 L of dark blood-tinged fluid was removed. Samples were sent to the laboratory as requested by the clinical team. IMPRESSION: Successful ultrasound guided right thoracentesis yielding 1.7 L of pleural fluid. Electronically Signed   By: Lorriane Shire M.D.   On: 11/21/2016 13:09   US Thoracentesis Asp Pleural Space W/img Guide  Result Date: 10/31/2016 INDICATION: Right pleural effusion. EXAM: ULTRASOUND GUIDED RIGHT THORACENTESIS MEDICATIONS: None. COMPLICATIONS: None immediate. PROCEDURE: An ultrasound guided thoracentesis was thoroughly discussed with the patient and questions answered. The benefits, risks, alternatives and complications were also discussed. The patient understands and wishes to proceed with the procedure. Written consent was obtained. Ultrasound was performed to localize and mark an adequate pocket of fluid in the right chest. The area was then prepped and draped in the normal sterile fashion. 1% Lidocaine was  used for local anesthesia. Under ultrasound guidance a Yuen catheter was introduced. Thoracentesis was performed. The catheter was removed and a dressing applied. FINDINGS: A total of approximately 1 L of blood-tinged fluid was removed. Samples were sent to the laboratory as requested by the clinical team. IMPRESSION: Successful ultrasound guided right thoracentesis yielding 1 L of pleural fluid. The patient tolerated the procedure well. No immediate complications. Electronically Signed   By: Lorriane Shire M.D.   On: 10/31/2016 13:09    Time Spent in minutes  35   Louellen Molder M.D on 11/23/2016 at 2:20 PM  Between 7am to 7pm - Pager - [phone]  After 7pm go to [email or web address] -  password [facility]  [facility] -  Office  [phone]

## 2016-11-23 NOTE — Progress Notes (Signed)
Dr. Aggie Moats called back and stated he ordered labs and is going to wait for labs before doing anything extra. Cardizem 20mg  dose x1 ordered as well. Given.  Will continue to monitor pt

## 2016-11-23 NOTE — Progress Notes (Signed)
Dr. Aggie Moats paged and made aware that pt is running in the 120's with Afib. Waiting for orders/call back Pt continues to rest quietly in room.Will continue to monitor pt

## 2016-11-23 NOTE — Progress Notes (Signed)
CCMD called and stated pt had converted back to Afib. Pt resting quietly in room, no c/o chest pain or fluttering in chest. Dr. Aggie Moats paged and made aware. Will continue to monitor pt

## 2016-11-23 NOTE — Progress Notes (Signed)
ANTICOAGULATION CONSULT NOTE - follow up  Pharmacy Consult for coumadin Indication: atrial fibrillation  Allergies  Allergen Reactions  . Ace Inhibitors Cough  . Amlodipine     Edema   . Codeine Rash   Patient Measurements: Height: 5\' 8"  (172.7 cm) Weight: 212 lb 8.4 oz (96.4 kg) IBW/kg (Calculated) : 63.9  Vital Signs: Temp: 98.7 F (37.1 C) (09/19 0504) Temp Source: Oral (09/19 0504) BP: 102/69 (09/19 0504) Pulse Rate: 99 (09/19 0504)  Labs:  Recent Labs  11/21/16 0452 11/22/16 0619 11/22/16 0934 11/23/16 0536  HGB 8.6*  --   --   --   HCT 27.1*  --   --   --   PLT 412*  --   --   --   LABPROT 30.0* 31.7*  --  25.8*  INR 2.89 3.10  --  2.38  CREATININE 2.60*  --  2.41* 2.24*  TROPONINI  --   --   --  <0.03   Estimated Creatinine Clearance: 28 mL/min (A) (by C-G formula based on SCr of 2.24 mg/dL (H)).  Medical History: Past Medical History:  Diagnosis Date  . Acute renal failure superimposed on stage 4 chronic kidney disease ([place]) 07/04/2015  . Allergic rhinitis   . Anemia   . Anxiety   . CAD in native artery    NSTEMI 07/2015 s/p DES to RCA and posterior PDA, PCI 10/2015 with scoring balloon to 85% ISR of distal RCA), known LAD/Cx disease treated medically  . Carotid artery disease ([place])    Mild bilateral carotid disease (1-39% 07/2015)  . Chronic combined systolic and diastolic CHF (congestive heart failure) ([place])   . CKD (chronic kidney disease), stage IV ([place])   . Constipation   . Essential hypertension   . GERD (gastroesophageal reflux disease)   . History of hysterectomy   . Hyperlipidemia   . Low back pain   . Obesity   . 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 Osteoarthritis   . Overactive bladder   . PVC's (premature ventricular contractions)   . Type 2 diabetes mellitus ([facility])    Medications:  Prescriptions Prior to Admission  Medication Sig Dispense Refill Last  Dose  . acetaminophen (TYLENOL) 500 MG tablet Take 2 tablets (1,000 mg total) by mouth every 6 (six) hours as needed (pain). 30 tablet 0 11/18/2016 at Unknown time  . albuterol (PROVENTIL HFA;VENTOLIN HFA) 108 (90 Base) MCG/ACT inhaler Inhale 1-2 puffs into the lungs every 6 (six) hours as needed for wheezing or shortness of breath. 1 Inhaler 0 11/19/2016 at Unknown time  . albuterol (PROVENTIL) (2.5 MG/3ML) 0.083% nebulizer solution Take 3 mLs (2.5 mg total) by nebulization every 6 (six) hours as needed for wheezing or shortness of breath. 75 mL 1 11/19/2016 at Unknown time  . ALPRAZolam (XANAX) 0.5 MG tablet Take 0.5 mg by mouth at bedtime.     . [EXPIRED] cefUROXime (CEFTIN) 500 MG tablet Take 1 tablet (500 mg total) by mouth daily with breakfast. 5 tablet 0 11/18/2016 at Unknown time  . cyclobenzaprine (FLEXERIL) 5 MG tablet Take 5 mg by mouth at bedtime.  0 Past Week at Unknown time  . diazepam (VALIUM) 5 MG tablet Take 1 tablet (5 mg total) by mouth every 12 (twelve) hours as needed for muscle spasms. 10 tablet 0 Past Week at Unknown time  . ferrous sulfate 325 (65 FE) MG tablet Take 325 mg by mouth daily with  breakfast.    11/18/2016 at Unknown time  . fluticasone (FLONASE) 50 MCG/ACT nasal spray Place 1 spray into both nostrils daily.   11/18/2016 at Unknown time  . hydrALAZINE (APRESOLINE) 100 MG tablet Take 1 tablet (100 mg total) by mouth 3 (three) times daily. 120 tablet 1 11/18/2016 at Unknown time  . insulin glargine (LANTUS) 100 UNIT/ML injection Inject 40 Units into the skin at bedtime.   11/18/2016 at Unknown time  . insulin lispro (HUMALOG KWIKPEN) 100 UNIT/ML KiwkPen INJECT 18 TO 24 UNITS TOTAL INTO THE SKIN THREE TIMES DAILY 15 mL 2 11/18/2016 at Unknown time  . isosorbide mononitrate (IMDUR) 60 MG 24 hr tablet Take 1 tablet (60 mg total) by mouth daily. 30 tablet 0 11/18/2016 at Unknown time  . latanoprost (XALATAN) 0.005 % ophthalmic solution Place 1 drop into both eyes at bedtime.     11/18/2016 at Unknown time  . meclizine (ANTIVERT) 25 MG tablet Take 12.5 mg by mouth 2 (two) times daily.    11/18/2016 at Unknown time  . metoprolol succinate (TOPROL-XL) 25 MG 24 hr tablet Take 3 tablets (75 mg total) by mouth daily. Take with or immediately following a meal. 30 tablet 2 11/18/2016 at 2200  . Multiple Vitamin (MULTIVITAMIN WITH MINERALS) TABS tablet Take 1 tablet by mouth daily.   Past Week at Unknown time  . nitroGLYCERIN (NITROSTAT) 0.4 MG SL tablet Place 1 tablet (0.4 mg total) under the tongue every 5 (five) minutes as needed for chest pain. 25 tablet 2 unknown at unknown  . pantoprazole (PROTONIX) 40 MG tablet Take 40 mg by mouth daily.    11/18/2016 at Unknown time  . potassium chloride SA (K-DUR,KLOR-CON) 20 MEQ tablet Take 2 tablets (40 mEq total) by mouth daily. 60 tablet 5 11/18/2016 at Unknown time  . RANEXA 500 MG 12 hr tablet TAKE ONE TABLET BY MOUTH TWICE DAILY 60 tablet 5 11/18/2016 at Unknown time  . rosuvastatin (CRESTOR) 40 MG tablet Take 1 tablet (40 mg total) by mouth at bedtime. 30 tablet 0 11/18/2016 at Unknown time  . ticagrelor (BRILINTA) 60 MG TABS tablet Take 1 tablet (60 mg total) by mouth 2 (two) times daily. 60 tablet 1 11/18/2016 at 2200  . torsemide (DEMADEX) 20 MG tablet Take 60 mg by mouth 2 (two) times daily. Pt is able to take an additional tablet daily for weight gain greater than 3lbs.   11/18/2016 at Unknown time  . trolamine salicylate (ASPERCREME) 10 % cream Apply 1 application topically as needed for muscle pain.   Past Week at Unknown time  . warfarin (COUMADIN) 7.5 MG tablet Take 1 tablet (7.5 mg total) by mouth once. 45 tablet 2    Assessment: 71 yo female with significant cardiac history including CHF, CAD s/p PCI 2018; CKD stage 4; now with new onset Afib (nonvalvular).  Pt was initially started on rivaroxaban but due to renal fxn was switched to WARFARIN on 9/10. INR at goal today.  INR results have been quite labile recently.  Goal of  Therapy:  INR 2-3 Monitor platelets by anticoagulation protocol: Yes   Plan:  Coumadin 5mg  X 1 today PT-INR daily Monitor for S/S of bleeding  Pricilla Larsson, RPH  11/23/2016,10:34 AM

## 2016-11-23 NOTE — Consult Note (Signed)
Cardiology Consultation:   Patient ID: [name]; [id number]; 1945/11/24   Admit date: 11/19/2016 Date of Consult: 11/23/2016  Primary Care Provider: [name], MD Primary Cardiologist: [name]   Patient Profile:   [name] is a 71 y.o. female with PMH outlined below and recently diagnosed PAF who is being seen today for the evaluation of recurrent Afib with RVR at the request of [name].  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  Patient was just discharged 11/16/16 after admission with new onset atrial fibrillation with RVR and CHF. Coumadin was given because of CKD and rate was controlled with beta blocker. 2-D echo 11/11/16 LV EF 30-35% with grade 2 DD which was worse than before. Nuclear stress test 11/15/16 no ischemia high risk study because EF of 21%. See below for details.  Patient is readmitted 11/19/16 with recurrent A. fib with RVR, hypotension limiting treatment and CHF, no worsening renal function with creatinine of 3 and worsening right pleural effusion. She underwent thoracentesis 11/21/16. Last night patient's heart rate continued to go up but we are called to see her.  Patient says she became upset about her husband being in the hospital and now [name] center and her heart started racing. She says she didn't get her Toprol last night and that's why her heart's racing now. Makes her short of breath. No chest pain.  Past Medical History:  Diagnosis Date  . Acute renal failure superimposed on stage 4 chronic kidney disease ([place]) 07/04/2015  . Allergic rhinitis   . Anemia   . Anxiety   . CAD in native  artery    NSTEMI 07/2015 s/p DES to RCA and posterior PDA, PCI 10/2015 with scoring balloon to 85% ISR of distal RCA), known LAD/Cx disease treated medically  . Carotid artery disease ([place])    Mild bilateral carotid disease (1-39% 07/2015)  . Chronic combined systolic and diastolic CHF (congestive heart failure) ([place])   . CKD (chronic kidney disease), stage IV ([place])   . Constipation   . Essential hypertension   . GERD (gastroesophageal reflux disease)   . History of hysterectomy   . Hyperlipidemia   . Low back pain   . Obesity   . 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 Osteoarthritis   . Overactive bladder   . PVC's (premature ventricular contractions)   . Type 2 diabetes mellitus ([place])     Past Surgical History:  Procedure Laterality Date  . ABDOMINAL HYSTERECTOMY    . BACK SURGERY     multiple  . CARDIAC CATHETERIZATION  04/04/2006   Est EF of 60%  . CARDIAC CATHETERIZATION N/A 07/04/2015   Procedure: Left Heart Cath and Coronary Angiography;  Surgeon: Troy Sine, MD;  Location: [facility];  Service: Cardiovascular;  Laterality: N/A;  . CARDIAC CATHETERIZATION N/A 07/04/2015   Procedure: Coronary Stent Intervention;  Surgeon: Troy Sine, MD;  Location: [facility];  Service: Cardiovascular;  Laterality: N/A;  . CARDIAC CATHETERIZATION N/A 10/13/2015   Procedure: Left Heart Cath and Coronary Angiography;  Surgeon: Troy Sine, MD;  Location: [facility];  Service: Cardiovascular;  Laterality: N/A;  . CARDIAC CATHETERIZATION N/A 12/02/2015   Procedure: Left Heart Cath and Coronary Angiography;  Surgeon: Peter M Martinique, MD;  Location: [facility];  Service: Cardiovascular;  Laterality: N/A;  . CARPAL TUNNEL RELEASE    . CERVICAL BIOPSY     cervical lymph node biopsies  . COLONOSCOPY  May 2002   Dr. Irving Shows :Followup in 5 years, normal exam  . COLONOSCOPY  2008   Dr.  Laural Golden: Very redundant colon with mild melanosis coli, splenic flexure polyp biopsy with acute complaint of benign colon polyp. Recommended ten-year followup  . CORONARY STENT PLACEMENT  04/11/2006   2 -- Taxus stents to the circumflex   . PERIPHERAL VASCULAR CATHETERIZATION Right 07/09/2015   Procedure: Upper Extremity Angiography;  Surgeon: Conrad Asotin, MD;  Location: [facility];  Service: Cardiovascular;  Laterality: Right;  . PERIPHERAL VASCULAR CATHETERIZATION Right 07/10/2015   Procedure: RIGHT SUBCLAVIAN ARTERY THROMBECTOMY;  Surgeon: Serafina Mitchell, MD;  Location: [facility];  Service: Vascular;  Laterality: Right;  . Tendonitis     bilateral elbow  . TRIGGER FINGER RELEASE       Inpatient Medications: Scheduled Meds: . cyclobenzaprine  5 mg Oral QHS  . fluticasone  1 spray Each Nare Daily  . furosemide  40 mg Intravenous BID  . hydrALAZINE  25 mg Oral Q8H  . insulin aspart  0-9 Units Subcutaneous TID WC  . insulin glargine  28 Units Subcutaneous QHS  . isosorbide mononitrate  30 mg Oral Daily  . metoprolol succinate  100 mg Oral Q breakfast  . pantoprazole  40 mg Oral Daily  . polyethylene glycol  17 g Oral Daily  . polyethylene glycol  17 g Oral Daily  . potassium chloride  40 mEq Oral Q4H  . rosuvastatin  40 mg Oral QHS  . ticagrelor  60 mg Oral BID  . Warfarin - Pharmacist Dosing Inpatient   Does not apply q1800   Continuous Infusions: . ceFEPime (MAXIPIME) IV Stopped (11/22/16 1050)  . vancomycin Stopped (11/22/16 1335)   PRN Meds: acetaminophen, albuterol, ALPRAZolam, diazepam, guaiFENesin-dextromethorphan, ondansetron (ZOFRAN) IV  Allergies:    Allergies  Allergen Reactions  . Ace Inhibitors Cough  . Amlodipine     Edema   . Codeine Rash    Social History:   Social History   Social History  . Marital status: Married    Spouse name: N/A  . Number of children: 3  . Years of education: N/A   Occupational History  . Not on file.   Social History  Main Topics  . Smoking status: Never Smoker  . Smokeless tobacco: Never Used  . Alcohol use No  . Drug use: No  . Sexual activity: Not on file   Other Topics Concern  . Not on file   Social History Narrative  . No narrative on file    Family History:    Family History  Problem Relation Age of Onset  . Diabetes Father   . Hypertension Father   . Stroke Father   . Hypertension Brother   . Aneurysm Brother   . Diabetes Brother   . Colon cancer Neg Hx      ROS:  Please see the history of present illness.  Review of Systems  Constitution: Positive for weakness and malaise/fatigue.  HENT: Negative.   Eyes: Negative.   Cardiovascular: Positive for dyspnea on exertion, irregular heartbeat and  palpitations.  Respiratory: Positive for shortness of breath.   Hematologic/Lymphatic: Negative.   Musculoskeletal: Positive for muscle weakness. Negative for joint pain.  Gastrointestinal: Negative.   Genitourinary: Negative.   Psychiatric/Behavioral: The patient is nervous/anxious.      All other ROS reviewed and negative.     Physical Exam/Data:   Vitals:   11/22/16 1200 11/22/16 2022 11/22/16 2039 11/23/16 0504  BP:   (!) 154/66 102/69  Pulse:   86 99  Resp:   18 18  Temp: 98.6 F (37 C)  98.8 F (37.1 C) 98.7 F (37.1 C)  TempSrc: Oral  Oral Oral  SpO2:  95% 100% 99%  Weight:      Height:        Intake/Output Summary (Last 24 hours) at 11/23/16 0945 Last data filed at 11/23/16 0900  Gross per 24 hour  Intake              940 ml  Output             1200 ml  Net             -260 ml   Filed Weights   11/20/16 0500 11/21/16 0500 11/22/16 0500  Weight: 212 lb 15.4 oz (96.6 kg) 212 lb 8.4 oz (96.4 kg) 212 lb 8.4 oz (96.4 kg)   Body mass index is 32.31 kg/m.   General:  Obese woman in no acute distress  HEENT: normal Lymph: no adenopathy Neck: no JVD Endocrine:  No thryomegaly Vascular: bilateral carotid bruits; FA pulses 2+ bilaterally without bruits    Cardiac:  irreg irreg at 115/m  Lungs:  Decreased breath sounds with rales and crackles at bases right>left Abd: soft, nontender, no hepatomegaly  Ext: trace edema Musculoskeletal:  No deformities, BUE and BLE strength normal and equal Skin: warm and dry  Neuro:  CNs 2-12 intact, no focal abnormalities noted Psych:  Normal affect   EKG:  The EKG was personally reviewed and demonstrates:  Today atrial fib at 135 bpm Telemetry:  Telemetry was personally reviewed and demonstrates:  afib 115-150  Relevant CV Studies:  Left Heart Cath and Coronary Angiography 12/02/15:  Mid LAD lesion, 60 %stenosed.  Prox Cx to Mid Cx lesion, 99 %stenosed.  Mid RCA lesion, 0 %stenosed at site of prior stent  Dist RCA lesion, 30%stenosed at site of prior stent.  Prox RCA lesion, 40 %stenosed.  Ost Cx to Prox Cx lesion, 80 %stenosed.  LV end diastolic pressure is moderately elevated.   1. 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 Patent stents in the mid RCA and distal RCA/PDA 3. Moderate mid LAD disease- unchanged from prior studies. 4. Elevated LVEDP   Right arm angiography 07/09/15 showed distal right subclavian artery occlusion likely to be a chronic total occlusion for which she underwent right subclavian artery thromboembolectomy on 07/10/15.   Echocardiogram 11/11/2016 Left ventricle: The cavity size was normal. Wall thickness was   increased in a pattern of moderate LVH. Systolic function was   moderately to severely reduced. The estimated ejection fraction   was in the range of 30% to 35%. Features are consistent with a   pseudonormal left ventricular filling pattern, with concomitant   abnormal relaxation and increased filling pressure (grade 2   diastolic dysfunction). Doppler parameters are consistent with   high ventricular filling pressure. - Regional wall motion abnormality: Hypokinesis of the mid inferior   and basal-mid inferolateral myocardium. - Aortic  valve: Mildly calcified  annulus. Trileaflet; mildly   thickened leaflets. Valve area (VTI): 1.57 cm^2. Valve area   (Vmax): 1.57 cm^2. Valve area (Vmean): 1.51 cm^2. - Mitral valve: Mildly calcified annulus. Mildly thickened leaflets   . There was mild regurgitation. - Left atrium: The atrium was severely dilated. - Right atrium: The atrium was moderately dilated. - Pulmonary arteries: Systolic pressure was moderately increased.   PA peak pressure: 44 mm Hg (S).   Nuclear stress test (11/15/16):    There was no ST segment deviation noted during stress. T wave inversions inferiorly.  Defect 1: There is a large defect of severe severity present in the basal inferolateral, basal anterolateral, mid anterior, mid anteroseptal, mid inferolateral, mid anterolateral, apical anterior, apical inferior, apical lateral and apex location.  This is a high risk study.  Findings consistent with prior myocardial infarction. No ischemic territories.  Nuclear stress EF: 21%.   Laboratory Data:  Chemistry  Recent Labs Lab 11/21/16 0452 11/22/16 0934 11/23/16 0536  NA 138 139 138  K 3.9 3.5 3.1*  CL 101 102 102  CO2 26 28 28   GLUCOSE 167* 106* 105*  BUN 60* 50* 48*  CREATININE 2.60* 2.41* 2.24*  CALCIUM 8.6* 8.5* 8.3*  GFRNONAA 17* 19* 21*  GFRAA 20* 22* 24*  ANIONGAP 11 9 8      Recent Labs Lab 11/19/16 0815 11/21/16 0452 11/22/16 0934  PROT 7.6 6.8 6.8  ALBUMIN 2.9* 2.6* 2.6*  AST 62* 40 29  ALT 54 40 32  ALKPHOS 164* 153* 144*  BILITOT 0.4 0.5 0.3   Hematology  Recent Labs Lab 11/19/16 0815 11/21/16 0452  WBC 7.6 6.9  RBC 2.63* 2.79*  HGB 8.1* 8.6*  HCT 25.0* 27.1*  MCV 95.1 97.1  MCH 30.8 30.8  MCHC 32.4 31.7  RDW 16.9* 17.4*  PLT 372 412*   Cardiac Enzymes  Recent Labs Lab 11/23/16 0536  TROPONINI <0.03     Recent Labs Lab 11/19/16 0856  TROPIPOC 0.10*     Radiology/Studies:  Dg Chest 1 View  Result Date: 11/21/2016 CLINICAL DATA:  Pleural  effusions, right greater than left. EXAM: CHEST 1 VIEW COMPARISON:  11/20/2016 FINDINGS: Right pleural effusion has been eliminated. There is a moderate left effusion which appears slightly more prominent than on the prior study. Overall heart size and pulmonary vascularity are normal. No pneumothorax after thoracentesis. IMPRESSION: No pneumothorax after right thoracentesis. No visible residual right pleural fluid. Moderate left effusion appears slightly increased. Electronically Signed   By: Lorriane Shire M.D.   On: 11/21/2016 13:08   Ct Chest Wo Contrast  Result Date: 11/19/2016 CLINICAL DATA:  Shortness of breath. EXAM: CT CHEST WITHOUT CONTRAST TECHNIQUE: Multidetector CT imaging of the chest was performed following the standard protocol without IV contrast. COMPARISON:  Chest x-ray from same day. CT chest dated October 31, 2016. FINDINGS: Cardiovascular: Mild cardiomegaly, unchanged. New small pericardial effusion. Coronary, aortic arch, and branch vessel atherosclerotic vascular disease. Mediastinum/Nodes: Prominent and mildly enlarged mediastinal lymph nodes are stable to slightly decreased in size. No axillary lymphadenopathy. The thyroid gland, trachea, and esophagus are unremarkable. Lungs/Pleura: Large right and small left pleural effusions. Right greater than left bibasilar atelectasis. Additional atelectasis in the right middle lobe. No suspicious pulmonary nodules. No consolidation or pneumothorax. Upper Abdomen: No acute abnormality. Musculoskeletal: No chest wall mass or suspicious bone lesions identified. No fracture. Unchanged degenerative changes of the thoracic spine, worst at T7-T8 and T10-T11. IMPRESSION: 1. Large right and small left pleural effusions with adjacent right  greater than left atelectasis. 2. New small pericardial effusion. 3. Stable to slightly decreased in size mildly enlarged mediastinal lymph nodes, likely reactive. 4.  Aortic atherosclerosis (ICD10-I70.0). Electronically  Signed   By: Titus Dubin M.D.   On: 11/19/2016 11:16   Dg Chest Right Decubitus  Result Date: 11/19/2016 CLINICAL DATA:  Right pleural effusion. EXAM: CHEST - RIGHT DECUBITUS COMPARISON:  Chest x-ray from earlier today and CT of the chest November 19, 2016 FINDINGS: The right-side-down decubitus film demonstrates a layering right-sided effusion which correlates with comparison imaging. IMPRESSION: The right-sided pleural effusion layers on this study. Electronically Signed   By: Dorise Bullion III M.D   On: 11/19/2016 15:29   Dg Chest Port 1 View  Result Date: 11/20/2016 CLINICAL DATA:  Shortness of breath EXAM: PORTABLE CHEST 1 VIEW COMPARISON:  11/19/2016 FINDINGS: Large right and small to moderate left pleural effusions causing hazy opacities obscuring the lung bases. Mild enlargement of the cardiopericardial silhouette. No findings of acute pulmonary edema. The right pleural effusion has a greater layering effect than on 11/19/2016, possibly due to lack of complete settling prior to imaging. IMPRESSION: 1. Similar appearance of large right and small to moderate left pleural effusions with passive atelectasis. 2. Mild enlargement of the cardiopericardial silhouette. Electronically Signed   By: Van Clines M.D.   On: 11/20/2016 12:09   US Thoracentesis Asp Pleural Space W/img Guide  Result Date: 11/21/2016 INDICATION: Right pleural effusion. EXAM: ULTRASOUND GUIDED RIGHT THORACENTESIS MEDICATIONS: None. COMPLICATIONS: None immediate. PROCEDURE: An ultrasound guided thoracentesis was thoroughly discussed with the patient and questions answered. The benefits, risks, alternatives and complications were also discussed. The patient understands and wishes to proceed with the procedure. Written consent was obtained. Ultrasound was performed to localize and mark an adequate pocket of fluid in the RIGHT chest. The area was then prepped and draped in the normal sterile fashion. 1% Lidocaine was used  for local anesthesia. Under ultrasound guidance a Yueh catheter was introduced. Thoracentesis was performed. The catheter was removed and a dressing applied. FINDINGS: A total of approximately 1.7 L of dark blood-tinged fluid was removed. Samples were sent to the laboratory as requested by the clinical team. IMPRESSION: Successful ultrasound guided right thoracentesis yielding 1.7 L of pleural fluid. Electronically Signed   By: Lorriane Shire M.D.   On: 11/21/2016 13:09    Assessment and Plan:   1. A. fib with RVR CHADSVASC=5 on Coumadin. Patient says she was taking Toprol 50 mg BID at home but review of d/c summary was 75 mg daily. Will increase Toprol 100 mg daily, decrease imdur 30 mg daily with low BP-hydralazine already lowered. 2. Ischemic cardiomyopathy ejection fraction 30-35% on echo 11/11/16 3. Acute on chronic systolic and diastolic CHF I/O's -2637 since admission on Demadex 60 mg daily, hydralazine 25 q8(was 100mg  q8), Imdur 60 mg daily. Will give IV Lasix today  4. CAD last cath 12/02/15 chronic occlusion of the circumflex, patent stents in the mid RCA, distal RCA and PDA, 60% mid LAD. Myoview negative for ischemia 11/15/16 large area of scar. No angina. 5. Right subclavian artery thromboembolectomy 07/10/15 on Brilinta and aspirin  6. CKD stage IV Crt improving today. 7. Diabetes mellitus type 2 uncontrolled   For questions or updates, please contact [facility] Please consult [email or web address] for contact info under Cardiology/STEMI.   Sumner Boast, PA-C 11/23/2016 9:45 AM    Attending:  Patient seen and examined. I personally reviewed her records and updated the chart. Case discussed  with Ms. Vita Barley. She has a history of CAD with occluded circumflex, previous stent interventions to the RCA and PDA, and moderate LAD disease that is being managed medically. Also CK D stage IV, type 2 diabetes mellitus, PAD, and recently documented ischemic cardiomyopathy with LVEF 30-35% as  well as paroxysmal atrial fibrillation with RVR. She was evaluated recently by Dr. Bronson Ing, placed on beta blocker for heart rate control and Coumadin for stroke prophylaxis. She presents back to the hospital with recurrent atrial fibrillation with RVR associated with decompensated systolic heart failure.  On examination this morning she is in no distress. Remains in atrial fibrillation by telemetry with heart rates in the 130s. Systolic blood pressure 786-754 range. Lungs exhibit coarse breath sounds, decreased in the bases. Anterior rhonchi noted as well. Cardiac exam reveals irregularly irregular rapid rhythm with indistinct PMI no obvious gallop. Lab work shows creatinine 2.2, troponin I negative, INR therapeutic at 2.38, hemoglobin 8.6 (chronic).  Chest x-ray on 9/17 status post right thoracentesis showed significantly improved right pleural effusion with moderate left pleural effusion. I personally reviewed the tracing from 9/19 which shows rapid atrial fibrillation/flutter with leftward axis and decreased R wave progression.  Patient presents with recurrent atrial fibrillation/flutter associated with RVR. She also has acute on chronic systolic heart failure. In light of difficulty controlling her atrial fibrillation from the perspective of heart rate management and also potential association with her cardiomyopathy, we will initiate amiodarone in an attempt to better maintain sinus rhythm. Would begin amiodarone 200 mg twice daily (recent LFTs and TSH reviewed), continue Toprol-XL. She will also continued on Coumadin for stroke prophylaxis. Would further diuresis on IV Lasix and otherwise continue combination of hydralazine and nitrates at reduced dose.  Satira Sark, M.D., F.A.C.C.

## 2016-11-23 NOTE — Progress Notes (Signed)
EKG done, shows AFIB with RVR. Dr.Hobbs paged and made aware. Waiting on orders/ call back. Pt resting quietly in room, no c/o chest pain or fluttering. Pt asymptomatic. Will continue to monitor pt

## 2016-11-23 NOTE — Progress Notes (Signed)
New order to give Cardizem 15mg  x1 for HR >110. Current HR 118-122, Cardizem given. Pt continues to rest quietly in room. Will continue to monitor pt

## 2016-11-23 NOTE — Progress Notes (Signed)
After receiving Cardizem, pts BP jumping from 107-140's to 130's. Dr. Aggie Moats paged and made aware. Also made aware that BP 102/69 and Hydralazine would be held this morning.

## 2016-11-24 DIAGNOSIS — I5043 Acute on chronic combined systolic (congestive) and diastolic (congestive) heart failure: Secondary | ICD-10-CM

## 2016-11-24 DIAGNOSIS — I255 Ischemic cardiomyopathy: Secondary | ICD-10-CM | POA: Diagnosis present

## 2016-11-24 DIAGNOSIS — I25119 Atherosclerotic heart disease of native coronary artery with unspecified angina pectoris: Secondary | ICD-10-CM

## 2016-11-24 LAB — BASIC METABOLIC PANEL
Anion gap: 9 (ref 5–15)
BUN: 51 mg/dL — ABNORMAL HIGH (ref 6–20)
CALCIUM: 8.5 mg/dL — AB (ref 8.9–10.3)
CO2: 26 mmol/L (ref 22–32)
CREATININE: 2.4 mg/dL — AB (ref 0.44–1.00)
Chloride: 100 mmol/L — ABNORMAL LOW (ref 101–111)
GFR, EST AFRICAN AMERICAN: 22 mL/min — AB (ref >60)
GFR, EST NON AFRICAN AMERICAN: 19 mL/min — AB (ref >60)
Glucose, Bld: 225 mg/dL — ABNORMAL HIGH (ref 65–99)
Potassium: 3.8 mmol/L (ref 3.5–5.1)
SODIUM: 135 mmol/L (ref 135–145)

## 2016-11-24 LAB — GLUCOSE, CAPILLARY
GLUCOSE-CAPILLARY: 217 mg/dL — AB (ref 65–99)
GLUCOSE-CAPILLARY: 220 mg/dL — AB (ref 65–99)
Glucose-Capillary: 185 mg/dL — ABNORMAL HIGH (ref 65–99)
Glucose-Capillary: 237 mg/dL — ABNORMAL HIGH (ref 65–99)

## 2016-11-24 LAB — PROTIME-INR
INR: 2.23
PROTHROMBIN TIME: 24.5 s — AB (ref 11.4–15.2)

## 2016-11-24 MED ORDER — POLYETHYLENE GLYCOL 3350 17 G PO PACK
17.0000 g | PACK | Freq: Two times a day (BID) | ORAL | Status: DC
  Administered 2016-11-24 – 2016-11-27 (×5): 17 g via ORAL
  Filled 2016-11-24 (×6): qty 1

## 2016-11-24 MED ORDER — BISACODYL 10 MG RE SUPP
10.0000 mg | Freq: Every day | RECTAL | Status: DC
  Administered 2016-11-26: 10 mg via RECTAL
  Filled 2016-11-24 (×4): qty 1

## 2016-11-24 MED ORDER — INSULIN ASPART 100 UNIT/ML ~~LOC~~ SOLN: 0.0000 [IU] | Freq: Three times a day (TID) | SUBCUTANEOUS | Status: DC

## 2016-11-24 MED ORDER — FUROSEMIDE 10 MG/ML IJ SOLN
60.0000 mg | Freq: Two times a day (BID) | INTRAMUSCULAR | Status: DC
  Administered 2016-11-24 – 2016-11-25 (×2): 60 mg via INTRAVENOUS
  Filled 2016-11-24 (×2): qty 6

## 2016-11-24 MED ORDER — INSULIN ASPART 100 UNIT/ML ~~LOC~~ SOLN
0.0000 [IU] | Freq: Three times a day (TID) | SUBCUTANEOUS | Status: DC
  Administered 2016-11-24: 5 [IU] via SUBCUTANEOUS
  Administered 2016-11-24 – 2016-11-26 (×4): 3 [IU] via SUBCUTANEOUS
  Administered 2016-11-26: 2 [IU] via SUBCUTANEOUS
  Administered 2016-11-27: 8 [IU] via SUBCUTANEOUS

## 2016-11-24 MED ORDER — INSULIN GLARGINE 100 UNIT/ML ~~LOC~~ SOLN
35.0000 [IU] | Freq: Every day | SUBCUTANEOUS | Status: DC
  Administered 2016-11-24: 35 [IU] via SUBCUTANEOUS
  Filled 2016-11-24 (×4): qty 0.35

## 2016-11-24 MED ORDER — INSULIN ASPART 100 UNIT/ML ~~LOC~~ SOLN
3.0000 [IU] | Freq: Three times a day (TID) | SUBCUTANEOUS | Status: DC
  Administered 2016-11-24 – 2016-11-26 (×6): 3 [IU] via SUBCUTANEOUS

## 2016-11-24 MED ORDER — WARFARIN SODIUM 5 MG PO TABS
5.0000 mg | ORAL_TABLET | Freq: Once | ORAL | Status: AC
  Administered 2016-11-24: 5 mg via ORAL
  Filled 2016-11-24: qty 1

## 2016-11-24 MED ORDER — INSULIN ASPART 100 UNIT/ML ~~LOC~~ SOLN
0.0000 [IU] | Freq: Every day | SUBCUTANEOUS | Status: DC
  Administered 2016-11-24: 2 [IU] via SUBCUTANEOUS

## 2016-11-24 NOTE — Progress Notes (Signed)
Progress Note  Patient Name: [name] Date of Encounter: 11/24/2016  Primary Cardiologist: Dr.Suresh Bronson Ing  Subjective   Still reports shortness of breath while moving around the room this morning. No chest pain. No PND at present. No nausea.  Inpatient Medications    Scheduled Meds: . amiodarone  200 mg Oral BID  . bisacodyl  10 mg Rectal Daily  . cyclobenzaprine  5 mg Oral QHS  . fluticasone  1 spray Each Nare Daily  . furosemide  60 mg Intravenous BID  . hydrALAZINE  25 mg Oral Q8H  . insulin aspart  0-15 Units Subcutaneous TID WC  . insulin aspart  0-5 Units Subcutaneous QHS  . insulin aspart  3 Units Subcutaneous TID WC  . insulin glargine  35 Units Subcutaneous QHS  . isosorbide mononitrate  30 mg Oral Daily  . metoprolol succinate  100 mg Oral Q breakfast  . pantoprazole  40 mg Oral Daily  . polyethylene glycol  17 g Oral BID  . rosuvastatin  40 mg Oral QHS  . ticagrelor  60 mg Oral BID  . warfarin  5 mg Oral Once  . Warfarin - Pharmacist Dosing Inpatient   Does not apply Q24H    PRN Meds: acetaminophen, albuterol, ALPRAZolam, diazepam, guaiFENesin-dextromethorphan, ondansetron (ZOFRAN) IV   Vital Signs    Vitals:   11/24/16 0518 11/24/16 0900 11/24/16 1314 11/24/16 1444  BP: 129/68  129/60 (!) 119/55  Pulse: 79  77 74  Resp: 18  17 16   Temp: 98.7 F (37.1 C)  98.8 F (37.1 C) 98.4 F (36.9 C)  TempSrc: Oral  Oral Oral  SpO2: 96%  100% 100%  Weight:  216 lb (98 kg)    Height:        Intake/Output Summary (Last 24 hours) at 11/24/16 1625 Last data filed at 11/24/16 1300  Gross per 24 hour  Intake              720 ml  Output              300 ml  Net              420 ml   Filed Weights   11/21/16 0500 11/22/16 0500 11/24/16 0900  Weight: 212 lb 8.4 oz (96.4 kg) 212 lb 8.4 oz (96.4 kg) 216 lb (98 kg)    Telemetry    Sinus rhythm with occasional PVCs. Personally reviewed.  Physical Exam   GEN: Overweight woman. No acute  distress.   Neck:  Mild increased JVP. Cardiac: RRR, no gallops.  Respiratory:  Decreased breath sounds left base. GI: Soft, nontender, non-distended  MS: No edema; No deformity.  Labs    Chemistry Recent Labs Lab 11/19/16 0815  11/21/16 0452 11/22/16 0934 11/23/16 0536 11/24/16 0609  NA 134*  < > 138 139 138 135  K 4.3  < > 3.9 3.5 3.1* 3.8  CL 96*  < > 101 102 102 100*  CO2 25  < > 26 28 28 26   GLUCOSE 181*  < > 167* 106* 105* 225*  BUN 67*  < > 60* 50* 48* 51*  CREATININE 3.00*  < > 2.60* 2.41* 2.24* 2.40*  CALCIUM 9.0  < > 8.6* 8.5* 8.3* 8.5*  PROT 7.6  --  6.8 6.8  --   --   ALBUMIN 2.9*  --  2.6* 2.6*  --   --   AST 62*  --  40 29  --   --  ALT 54  --  40 32  --   --   ALKPHOS 164*  --  153* 144*  --   --   BILITOT 0.4  --  0.5 0.3  --   --   GFRNONAA 15*  < > 17* 19* 21* 19*  GFRAA 17*  < > 20* 22* 24* 22*  ANIONGAP 13  < > 11 9 8 9   < > = values in this interval not displayed.   Hematology  Recent Labs Lab 11/19/16 0815 11/21/16 0452  WBC 7.6 6.9  RBC 2.63* 2.79*  HGB 8.1* 8.6*  HCT 25.0* 27.1*  MCV 95.1 97.1  MCH 30.8 30.8  MCHC 32.4 31.7  RDW 16.9* 17.4*  PLT 372 412*    Cardiac Enzymes  Recent Labs Lab 11/23/16 0536  TROPONINI <0.03     Recent Labs Lab 11/19/16 0856  TROPIPOC 0.10*     Cardiac Studies   Echocardiogram 11/11/2016: Left ventricle: The cavity size was normal. Wall thickness was increased in a pattern of moderate LVH. Systolic function was moderately to severely reduced. The estimated ejection fraction was in the range of 30% to 35%. Features are consistent with a pseudonormal left ventricular filling pattern, with concomitant abnormal relaxation and increased filling pressure (grade 2 diastolic dysfunction). Doppler parameters are consistent with high ventricular filling pressure. - Regional wall motion abnormality: Hypokinesis of the mid inferior and basal-mid inferolateral myocardium. - Aortic  valve: Mildly calcified annulus. Trileaflet; mildly thickened leaflets. Valve area (VTI): 1.57 cm^2. Valve area (Vmax): 1.57 cm^2. Valve area (Vmean): 1.51 cm^2. - Mitral valve: Mildly calcified annulus. Mildly thickened leaflets . There was mild regurgitation. - Left atrium: The atrium was severely dilated. - Right atrium: The atrium was moderately dilated. - Pulmonary arteries: Systolic pressure was moderately increased. PA peak pressure: 44 mm Hg (S).  Nuclear stress test (11/15/16):   There was no ST segment deviation noted during stress. T wave inversions inferiorly.  Defect 1: There is a large defect of severe severity present in the basal inferolateral, basal anterolateral, mid anterior, mid anteroseptal, mid inferolateral, mid anterolateral, apical anterior, apical inferior, apical lateral and apex location.  This is a high risk study.  Findings consistent with prior myocardial infarction. No ischemic territories.  Nuclear stress EF: 21%.   Patient Profile      71 y.o. female with CAD with occluded circumflex, previous stent interventions to the RCA and PDA, and moderate LAD disease that is being managed medically. Also CKD stage IV, type 2 diabetes mellitus, PAD, and recently documented ischemic cardiomyopathy with LVEF 30-35% as well as paroxysmal atrial fibrillation with RVR.  Assessment & Plan    1. Atrial fib with RVR:  She has converted to sinus rhythm following initiation of amiodarone. Her CHADS VASC Score is 5 and she remains on coumadin in the setting of CKD. She had Toprol increased to 100 mg daily from 75 mg daily.  2. Ischemic Cardiomyopathy: Most recent EF of 30-35%. Remains on hydralazine, Imdur and metoprolol. Not on ACE due to CKD.   3. 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  4. S/P Right Thoracentesis: In the setting of right pleural effusion.  Removal of 1.7 liters on 11/21/2016.   5. CAD:  Hx of patent stents to mid RCA, distal RCA, and PDA, with 69% residual mid LAD stenosis and CTO of Cx. Negative myoview this month, with large scar, but no new  ischemia. Remains on BB and Brilinta but not ASA as she is also on coumadin.   6. CVD Stage IV: Creatinine 2.40 increased from 2.24 yesterday. No ACE or ARB. On Imdur.   Signed, Phill Myron. West Pugh, ANP, AACC   11/24/2016, 4:25 PM     Attending note:  Patient seen and examined. Reviewed interval chart, lab work, and telemetry. I modified the above note by Ms. West Pugh. Ms. Qian has converted to sinus rhythm following initiation of amiodarone. Still reports dyspnea on exertion with movement around the room. This afternoon she feels somewhat better. Plan is to continue with IV diuresis and present cardiac medical regimen. In addition to IV Lasix she is on hydralazine, Imdur, Brilinta, Crestor, Coumadin and Toprol-XL. Blood pressure and heart rate are stable and she is diuresing. Need to increase activity, would also check ambulatory oxygen saturation.  Satira Sark, M.D., F.A.C.C.

## 2016-11-24 NOTE — Progress Notes (Signed)
PROGRESS NOTE                                                                                                                                                                                                             Patient Demographics:    [name], is a 71 y.o. female, DOB - [birth year], TTS:[id number]  Admit date - 11/19/2016   Admitting Physician Reyne Dumas, MD  Outpatient Primary MD for the patient is [name], MD  LOS - 5  Outpatient Specialists:Dr. Berneta Sages  Chief Complaint  Patient presents with  . Weakness  . Shortness of Breath       Brief Narrative  71 year old female with history of coronary artery disease with stenting, chronic combined systolic and diastolic CHF, right subclavian artery occlusion with thrombectomy in 07/2015 and in 09/2016, CK D stage IV, NSTEMI, uncontrolled type 2 diabetes mellitus, who was hospitalized from 9/6-9/12 with new onset A. fib and worsened heart failure. Patient was discharged home on anticoagulation on 9/12 after symptoms improved but returned back on 9/15 with A. fib with RVR and worsened renal function and worsened right pleural effusion. Patient placed in stepdown unit and underwent thoracentesis on 9/17. Cardiology consulted for CHF and persistent A. fib.   Subjective:   Heart rate better today. Feels her breathing to be slightly better.   Assessment  & Plan :   Principal problem Atrial fibrillation with RVR Toprol dose increased to 100 mg daily. Hydralazine and Imdur dose reduced due to soft blood pressure. Cardiology consult appreciated and given her recurrent atrial fibrillation started on amiodarone with improved heart rate. -Started on Coumadin recently. CHADS2vasc of 5. INR therapeutic.  Acute on chronic systolic and diastolic CHF Increased IV Lasix dose to 60 mg twice a day. Monitor strict I/O and daily weight. Also change diet to heart healthy  with fluid resection. Continue hydralazine and Imdur at lower dose. Increased Toprol dose. 2-D echo this admission with EF of 30-35% with grade 2 diastolic dysfunction.  Underwent right thoracentesis on 9/17 with improvement in follow-up chest x-ray.  Acute on chronic kidney disease stage III Baseline creatinine around 2. Presented with worsening creatinine of 3. Suspect cardiorenal syndrome. Monitor closely with IV Lasix.  Coronary artery disease  History of stenting to mid RCA and distal RCA/PDA Ranexa stopped in the setting of worsened renal  function. Recent Lexiscan Myoview from 11/15/2016 shows large area of scar without ischemia. Continue medical management.   Subclavian artery occlusion Status post thrombectomy in July. Continue aspirin and [place] tie  Escherichia coli UTI Completed treatment during previous admission.  Anemia of chronic disease Stable.  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 A1c of 8.8. Hypoglycemic yesterday. Increased Lantus dose and added pre-meal aspart.   Right pleural effusion Suspected due to CHF. Cytology negative for malignancy (shows reactive mesothelial cells only). Discontinued antibiotics.  Hypokalemia Replenished    Code Status : Full code  Family Communication  : Friend at bedside  Disposition Plan  : Home once CHF and heart rate improved  Barriers For Discharge : Active symptoms  Consults  :  Cardiology  Procedures  :  2-D echo Right thoracentesis  DVT Prophylaxis  :  Coumadin  Lab Results  Component Value Date   PLT 412 (H) 11/21/2016    Antibiotics  :    Anti-infectives    Start     Dose/Rate Route Frequency Ordered Stop   11/24/16 1000  cefTRIAXone (ROCEPHIN) 1 g in dextrose 5 % 50 mL IVPB  Status:  Discontinued     1 g 100 mL/hr over 30 Minutes Intravenous Every 24 hours 11/23/16 1431 11/23/16 1434   11/20/16 1100  vancomycin (VANCOCIN) IVPB 1000 mg/200 mL premix  Status:   Discontinued     1,000 mg 200 mL/hr over 60 Minutes Intravenous Every 24 hours 11/19/16 1211 11/23/16 1431   11/20/16 1000  ceFEPIme (MAXIPIME) 1 g in dextrose 5 % 50 mL IVPB  Status:  Discontinued     1 g 100 mL/hr over 30 Minutes Intravenous Every 24 hours 11/19/16 1211 11/23/16 1431   11/20/16 0000  vancomycin (VANCOCIN) 1,500 mg in sodium chloride 0.9 % 500 mL IVPB  Status:  Discontinued     1,500 mg 250 mL/hr over 120 Minutes Intravenous Every 12 hours 11/19/16 1205 11/19/16 1211   11/19/16 1215  ceFEPIme (MAXIPIME) 2 g in dextrose 5 % 50 mL IVPB  Status:  Discontinued     2 g 100 mL/hr over 30 Minutes Intravenous  Once 11/19/16 1205 11/19/16 1211   11/19/16 1015  vancomycin (VANCOCIN) 1,500 mg in sodium chloride 0.9 % 500 mL IVPB     1,500 mg 250 mL/hr over 120 Minutes Intravenous  Once 11/19/16 1000 11/19/16 1245   11/19/16 0945  ceFEPIme (MAXIPIME) 2 g in dextrose 5 % 50 mL IVPB     2 g 100 mL/hr over 30 Minutes Intravenous  Once 11/19/16 0944 11/19/16 1041   11/19/16 0945  vancomycin (VANCOCIN) IVPB 1000 mg/200 mL premix  Status:  Discontinued     1,000 mg 200 mL/hr over 60 Minutes Intravenous  Once 11/19/16 0944 11/19/16 1000        Objective:   Vitals:   11/23/16 2058 11/24/16 0518 11/24/16 0900 11/24/16 1314  BP: (!) 120/97 129/68  129/60  Pulse: 78 79  77  Resp: 18 18  17   Temp: 98.1 F (36.7 C) 98.7 F (37.1 C)  98.8 F (37.1 C)  TempSrc: Oral Oral  Oral  SpO2: 100% 96%  100%  Weight:   98 kg (216 lb)   Height:        Wt Readings from Last 3 Encounters:  11/24/16 98 kg (216 lb)  11/16/16 94.5 kg (208 lb 6.4 oz)  10/31/16 91.4 kg (201 lb 8 oz)     Intake/Output Summary (Last 24  hours) at 11/24/16 1414 Last data filed at 11/24/16 1100  Gross per 24 hour  Intake              480 ml  Output              200 ml  Net              280 ml     Physical Exam Gen.: Not in distress HEENT: Moist mucosa, supple neck, JVD + Chest: Diminished bilateral breath  sounds (R>L) CVS: S1 and S2 irregular, no murmurs GI: Soft, nondistended, nontender Musculoskeletal: 1+ pitting edema bilaterally      Data Review:    CBC  Recent Labs Lab 11/19/16 0815 11/21/16 0452  WBC 7.6 6.9  HGB 8.1* 8.6*  HCT 25.0* 27.1*  PLT 372 412*  MCV 95.1 97.1  MCH 30.8 30.8  MCHC 32.4 31.7  RDW 16.9* 17.4*  LYMPHSABS 0.4*  --   MONOABS 0.7  --   EOSABS 0.0  --   BASOSABS 0.0  --     Chemistries   Recent Labs Lab 11/19/16 0815 11/20/16 1018 11/21/16 0452 11/22/16 0934 11/23/16 0536 11/24/16 0609  NA 134* 135 138 139 138 135  K 4.3 4.2 3.9 3.5 3.1* 3.8  CL 96* 100* 101 102 102 100*  CO2 25 23 26 28 28 26   GLUCOSE 181* 146* 167* 106* 105* 225*  BUN 67* 62* 60* 50* 48* 51*  CREATININE 3.00* 2.80* 2.60* 2.41* 2.24* 2.40*  CALCIUM 9.0 8.8* 8.6* 8.5* 8.3* 8.5*  MG  --   --   --   --  2.0  --   AST 62*  --  40 29  --   --   ALT 54  --  40 32  --   --   ALKPHOS 164*  --  153* 144*  --   --   BILITOT 0.4  --  0.5 0.3  --   --    ------------------------------------------------------------------------------------------------------------------ No results for input(s): CHOL, HDL, LDLCALC, TRIG, CHOLHDL, LDLDIRECT in the last 72 hours.  Lab Results  Component Value Date   HGBA1C 8.8 (H) 10/31/2016   ------------------------------------------------------------------------------------------------------------------ No results for input(s): TSH, T4TOTAL, T3FREE, THYROIDAB in the last 72 hours.  Invalid input(s): FREET3 ------------------------------------------------------------------------------------------------------------------ No results for input(s): VITAMINB12, FOLATE, FERRITIN, TIBC, IRON, RETICCTPCT in the last 72 hours.  Coagulation profile  Recent Labs Lab 11/20/16 0426 11/21/16 0452 11/22/16 0619 11/23/16 0536 11/24/16 0609  INR 3.15 2.89 3.10 2.38 2.23    No results for input(s): DDIMER in the last 72 hours.  Cardiac  Enzymes  Recent Labs Lab 11/23/16 0536  TROPONINI <0.03   ------------------------------------------------------------------------------------------------------------------    Component Value Date/Time   BNP 648.0 (H) 10/31/2016 0528    Inpatient Medications  Scheduled Meds: . amiodarone  200 mg Oral BID  . bisacodyl  10 mg Rectal Daily  . cyclobenzaprine  5 mg Oral QHS  . fluticasone  1 spray Each Nare Daily  . furosemide  60 mg Intravenous BID  . hydrALAZINE  25 mg Oral Q8H  . insulin aspart  0-15 Units Subcutaneous TID WC  . insulin aspart  3 Units Subcutaneous TID WC  . insulin glargine  35 Units Subcutaneous QHS  . isosorbide mononitrate  30 mg Oral Daily  . metoprolol succinate  100 mg Oral Q breakfast  . pantoprazole  40 mg Oral Daily  . polyethylene glycol  17 g Oral BID  . rosuvastatin  40 mg  Oral QHS  . ticagrelor  60 mg Oral BID  . warfarin  5 mg Oral Once  . Warfarin - Pharmacist Dosing Inpatient   Does not apply Q24H   Continuous Infusions:  PRN Meds:.acetaminophen, albuterol, ALPRAZolam, diazepam, guaiFENesin-dextromethorphan, ondansetron (ZOFRAN) IV  Micro Results Recent Results (from the past 240 hour(s))  Gram stain     Status: None   Collection Time: 11/21/16 12:15 PM  Result Value Ref Range Status   Specimen Description PLEURAL  Final   Special Requests NONE  Final   Gram Stain NO ORGANISMS SEEN PERFORMED AT [facility] NO WBC SEEN   Final   Report Status 11/23/2016 FINAL  Final  Culture, body fluid-bottle     Status: None (Preliminary result)   Collection Time: 11/21/16 12:15 PM  Result Value Ref Range Status   Specimen Description PLEURAL  Final   Special Requests BOTTLES DRAWN AEROBIC AND ANAEROBIC 10 CC EACH  Final   Culture NO GROWTH 3 DAYS  Final   Report Status PENDING  Incomplete    Radiology Reports Dg Chest 1 View  Result Date: 11/21/2016 CLINICAL DATA:  Pleural effusions, right greater than left. EXAM: CHEST 1 VIEW COMPARISON:   11/20/2016 FINDINGS: Right pleural effusion has been eliminated. There is a moderate left effusion which appears slightly more prominent than on the prior study. Overall heart size and pulmonary vascularity are normal. No pneumothorax after thoracentesis. IMPRESSION: No pneumothorax after right thoracentesis. No visible residual right pleural fluid. Moderate left effusion appears slightly increased. Electronically Signed   By: Lorriane Shire M.D.   On: 11/21/2016 13:08   Dg Chest 1 View  Result Date: 10/31/2016 CLINICAL DATA:  Status post right thoracentesis. EXAM: CHEST 1 VIEW COMPARISON:  10/31/2016. FINDINGS: Stable enlarged cardiac silhouette. Minimal left lower lobe atelectasis with improvement. Minimal bilateral pleural fluid, mildly improved on the left and significantly improved on the right following thoracentesis. No pneumothorax. Thoracic spine degenerative changes. IMPRESSION: 1. Almost completely resolved right pleural fluid following thoracentesis without pneumothorax. 2. Small left pleural effusion with improvement with decreased left basilar atelectasis. Electronically Signed   By: Claudie Revering M.D.   On: 10/31/2016 13:30   Dg Chest 2 View  Result Date: 11/19/2016 CLINICAL DATA:  Short of breath. Discharged 3 days ago with congestive heart failure. EXAM: CHEST  2 VIEW COMPARISON:  11/10/2016 FINDINGS: Degraded lateral view secondary to positioning and overlying artifact. Midline trachea. Normal heart size for level of inspiration. Increase in moderate right pleural effusion. No pneumothorax. Low lung volumes. Mild pulmonary venous congestion. Worsened right and developing mild left base airspace disease. IMPRESSION: Increase in moderate right pleural effusion with adjacent atelectasis or infection. Mild pulmonary venous congestion with minimal left base atelectasis. Electronically Signed   By: Abigail Miyamoto M.D.   On: 11/19/2016 09:08   Dg Chest 2 View  Result Date: 10/31/2016 CLINICAL  DATA:  Chest pain and shortness of breath.  Cough. EXAM: CHEST  2 VIEW COMPARISON:  08/07/2016 FINDINGS: There is cardiomegaly. Bilateral pleural effusions, moderate on the right and small on the left. There is vascular congestion and peribronchial cuffing suggesting pulmonary edema. No pneumothorax. IMPRESSION: Cardiomegaly with bilateral pleural effusions and vascular congestion. Peribronchial cuffing may reflect pulmonary edema or bronchial inflammation. Electronically Signed   By: Jeb Levering M.D.   On: 10/31/2016 06:11   Ct Soft Tissue Neck Wo Contrast  Result Date: 10/29/2016 CLINICAL DATA:  Left-sided neck pain after doing exercises in cardiac rehab last week. EXAM: CT NECK  WITHOUT CONTRAST TECHNIQUE: Multidetector CT imaging of the neck was performed following the standard protocol without intravenous contrast. COMPARISON:  None. FINDINGS: Pharynx and larynx: No noted mass or inflammation. Limited at the level of the larynx due to patient motion. Salivary glands: No inflammation, mass, or stone. Thyroid: Normal. Lymph nodes: No enlargement or abnormal density of cervical lymph nodes. Vascular: Limited without contrast. Moderate calcified plaque at the carotid bifurcations. Limited intracranial: Negative Visualized orbits: Partly seen. No acute finding. Right cataract resection. Mastoids and visualized paranasal sinuses: Lobulated soft tissue in the left maxillary could be polyp or lobulated retention cyst. No destructive changes in the neighboring bone. Skeleton: Negative for acute or destructive finding. There are calcified disc bulges from C2-3 to C6-7, with ligamentum flavum thickening and calcification at C4-5. These cause cord deformity at C3-4 and C4-5. Patient has undergone laminectomy at C5-6. Upper chest: Left peritracheal soft tissue is likely multiple borderline enlarged lymph nodes rather than 1 large node or nodal conglomerate. These could be congestive given layering pleural effusions,  small where seen. IMPRESSION: 1. Limited by patient motion and lack of IV contrast. No specific explanation for acute neck pain. 2. Advanced cervical spine degeneration with calcified disc bulges and ligamentum flavum thickening. 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 3. Layering pleural effusions, small where seen. 4. Polypoid soft tissue in the left maxillary sinus, nonspecific without contrast. Electronically Signed   By: Monte Fantasia M.D.   On: 10/29/2016 18:39   Ct Chest Wo Contrast  Result Date: 11/19/2016 CLINICAL DATA:  Shortness of breath. EXAM: CT CHEST WITHOUT CONTRAST TECHNIQUE: Multidetector CT imaging of the chest was performed following the standard protocol without IV contrast. COMPARISON:  Chest x-ray from same day. CT chest dated October 31, 2016. FINDINGS: Cardiovascular: Mild cardiomegaly, unchanged. New small pericardial effusion. Coronary, aortic arch, and branch vessel atherosclerotic vascular disease. Mediastinum/Nodes: Prominent and mildly enlarged mediastinal lymph nodes are stable to slightly decreased in size. No axillary lymphadenopathy. The thyroid gland, trachea, and esophagus are unremarkable. Lungs/Pleura: Large right and small left pleural effusions. Right greater than left bibasilar atelectasis. Additional atelectasis in the right middle lobe. No suspicious pulmonary nodules. No consolidation or pneumothorax. Upper Abdomen: No acute abnormality. Musculoskeletal: No chest wall mass or suspicious bone lesions identified. No fracture. Unchanged degenerative changes of the thoracic spine, worst at T7-T8 and T10-T11. IMPRESSION: 1. Large right and small left pleural effusions with adjacent right greater than left atelectasis. 2. New small pericardial effusion. 3. Stable to slightly decreased in size mildly enlarged mediastinal lymph nodes, likely reactive. 4.  Aortic atherosclerosis (ICD10-I70.0). Electronically Signed   By:  Titus Dubin M.D.   On: 11/19/2016 11:16   Ct Chest Wo Contrast  Result Date: 10/31/2016 CLINICAL DATA:  Shortness of breath with exertion EXAM: CT CHEST WITHOUT CONTRAST TECHNIQUE: Multidetector CT imaging of the chest was performed following the standard protocol without IV contrast. COMPARISON:  Chest x-ray 10/31/2016 FINDINGS: Cardiovascular: Aortic and coronary artery calcifications. No aneurysm. Mild cardiomegaly. Mediastinum/Nodes: Scattered borderline and mildly enlarged mediastinal lymph nodes. Index AP window lymph node has a short axis diameter of 18 mm. Index right paratracheal node has a short axis diameter of 9 mm. No axillary adenopathy. Lungs/Pleura: Large right pleural effusion and small left pleural effusion. Bibasilar atelectasis. Mild vascular congestion. Upper Abdomen: Imaging into the upper abdomen shows no acute findings. Musculoskeletal: Chest wall soft tissues are unremarkable. No acute bony abnormality. IMPRESSION: Large right  pleural effusion and small left effusion. Bibasilar atelectasis. Vascular congestion. Extensive coronary artery calcifications. Mildly enlarged mediastinal lymph nodes, likely reactive. Aortic Atherosclerosis (ICD10-I70.0). Electronically Signed   By: Rolm Baptise M.D.   On: 10/31/2016 07:08   Dg Chest Right Decubitus  Result Date: 11/19/2016 CLINICAL DATA:  Right pleural effusion. EXAM: CHEST - RIGHT DECUBITUS COMPARISON:  Chest x-ray from earlier today and CT of the chest November 19, 2016 FINDINGS: The right-side-down decubitus film demonstrates a layering right-sided effusion which correlates with comparison imaging. IMPRESSION: The right-sided pleural effusion layers on this study. Electronically Signed   By: Dorise Bullion III M.D   On: 11/19/2016 15:29   Nm Myocar Multi W/spect Tamela Oddi Motion / Ef  Result Date: 11/15/2016  There was no ST segment deviation noted during stress. T wave inversions inferiorly.  Defect 1: There is a large defect of  severe severity present in the basal inferolateral, basal anterolateral, mid anterior, mid anteroseptal, mid inferolateral, mid anterolateral, apical anterior, apical inferior, apical lateral and apex location.  This is a high risk study.  Findings consistent with prior myocardial infarction. No ischemic territories.  Nuclear stress EF: 21%.    Dg Chest Port 1 View  Result Date: 11/20/2016 CLINICAL DATA:  Shortness of breath EXAM: PORTABLE CHEST 1 VIEW COMPARISON:  11/19/2016 FINDINGS: Large right and small to moderate left pleural effusions causing hazy opacities obscuring the lung bases. Mild enlargement of the cardiopericardial silhouette. No findings of acute pulmonary edema. The right pleural effusion has a greater layering effect than on 11/19/2016, possibly due to lack of complete settling prior to imaging. IMPRESSION: 1. Similar appearance of large right and small to moderate left pleural effusions with passive atelectasis. 2. Mild enlargement of the cardiopericardial silhouette. Electronically Signed   By: Van Clines M.D.   On: 11/20/2016 12:09   Dg Chest Port 1 View  Result Date: 11/10/2016 CLINICAL DATA:  Shortness of breath tonight EXAM: PORTABLE CHEST 1 VIEW COMPARISON:  October 31, 2016 FINDINGS: The mediastinal contour is normal. The heart size is mildly enlarged. There is a small right pleural effusion with patchy consolidation of right lung base. The left lung is clear. No acute abnormalities identified in the visualized bones. IMPRESSION: Small right pleural effusion with patchy consolidation of right lung base, underline pneumonia is not excluded. Electronically Signed   By: Abelardo Diesel M.D.   On: 11/10/2016 21:28   US Thoracentesis Asp Pleural Space W/img Guide  Result Date: 11/21/2016 INDICATION: Right pleural effusion. EXAM: ULTRASOUND GUIDED RIGHT THORACENTESIS MEDICATIONS: None. COMPLICATIONS: None immediate. PROCEDURE: An ultrasound guided thoracentesis was thoroughly  discussed with the patient and questions answered. The benefits, risks, alternatives and complications were also discussed. The patient understands and wishes to proceed with the procedure. Written consent was obtained. Ultrasound was performed to localize and mark an adequate pocket of fluid in the RIGHT chest. The area was then prepped and draped in the normal sterile fashion. 1% Lidocaine was used for local anesthesia. Under ultrasound guidance a Yueh catheter was introduced. Thoracentesis was performed. The catheter was removed and a dressing applied. FINDINGS: A total of approximately 1.7 L of dark blood-tinged fluid was removed. Samples were sent to the laboratory as requested by the clinical team. IMPRESSION: Successful ultrasound guided right thoracentesis yielding 1.7 L of pleural fluid. Electronically Signed   By: Lorriane Shire M.D.   On: 11/21/2016 13:09   US Thoracentesis Asp Pleural Space W/img Guide  Result Date: 10/31/2016 INDICATION: Right pleural effusion. EXAM:  ULTRASOUND GUIDED RIGHT THORACENTESIS MEDICATIONS: None. COMPLICATIONS: None immediate. PROCEDURE: An ultrasound guided thoracentesis was thoroughly discussed with the patient and questions answered. The benefits, risks, alternatives and complications were also discussed. The patient understands and wishes to proceed with the procedure. Written consent was obtained. Ultrasound was performed to localize and mark an adequate pocket of fluid in the right chest. The area was then prepped and draped in the normal sterile fashion. 1% Lidocaine was used for local anesthesia. Under ultrasound guidance a Yuen catheter was introduced. Thoracentesis was performed. The catheter was removed and a dressing applied. FINDINGS: A total of approximately 1 L of blood-tinged fluid was removed. Samples were sent to the laboratory as requested by the clinical team. IMPRESSION: Successful ultrasound guided right thoracentesis yielding 1 L of pleural fluid.  The patient tolerated the procedure well. No immediate complications. Electronically Signed   By: Lorriane Shire M.D.   On: 10/31/2016 13:09    Time Spent in minutes  25   Louellen Molder M.D on 11/24/2016 at 2:14 PM  Between 7am to 7pm - Pager - [phone]  After 7pm go to [email or web address] - password [email or web address]  [facility] -  Office  [phone]

## 2016-11-24 NOTE — Progress Notes (Signed)
Inpatient Diabetes Program Recommendations  AACE/ADA: New Consensus Statement on Inpatient Glycemic Control (2015)  Target Ranges:  Prepandial:   less than 140 mg/dL      Peak postprandial:   less than 180 mg/dL (1-2 hours)      Critically ill patients:  140 - 180 mg/dL   Results for FARYN, SIEG (MRN [id number]) as of 11/24/2016 09:55  Ref. Range 11/23/2016 07:29 11/23/2016 11:24 11/23/2016 16:38 11/23/2016 19:51 11/23/2016 21:01 11/24/2016 07:27  Glucose-Capillary Latest Ref Range: 65 - 99 mg/dL 81 114 (H) 245 (H) 258 (H) 317 (H) 217 (H)   Review of Glycemic Control  Diabetes history: DM2 Outpatient Diabetes medications: Lantus 40 units QHS, Humalog 18-24 units TID Current orders for Inpatient glycemic control: Lantus 28 units QHS, Novolog 0-9 units TID with meals  Inpatient Diabetes Program Recommendations:  Correction (SSI): Please consider ordering Novolog 0-5 units QHS for bedtime correction scale. Insulin - Meal Coverage: Please consider ordering Novolog 3 units TID with meals for meal coverage if patient eats at least 50% of meals.  Thanks, Barnie Alderman, RN, MSN, CDE Diabetes Coordinator Inpatient Diabetes Program [phone] (Team Pager from 8am to 5pm)

## 2016-11-24 NOTE — Progress Notes (Signed)
ANTICOAGULATION CONSULT NOTE - follow up  Pharmacy Consult for coumadin Indication: atrial fibrillation  Allergies  Allergen Reactions  . Ace Inhibitors Cough  . Amlodipine     Edema   . Codeine Rash   Patient Measurements: Height: 5\' 8"  (172.7 cm) Weight: 216 lb (98 kg) IBW/kg (Calculated) : 63.9  Vital Signs: Temp: 98.8 F (37.1 C) (09/20 1314) Temp Source: Oral (09/20 1314) BP: 129/60 (09/20 1314) Pulse Rate: 77 (09/20 1314)  Labs:  Recent Labs  11/22/16 0619 11/22/16 0934 11/23/16 0536 11/24/16 0609  LABPROT 31.7*  --  25.8* 24.5*  INR 3.10  --  2.38 2.23  CREATININE  --  2.41* 2.24* 2.40*  TROPONINI  --   --  <0.03  --    Estimated Creatinine Clearance: 26.3 mL/min (A) (by C-G formula based on SCr of 2.4 mg/dL (H)).  Medical History: Past Medical History:  Diagnosis Date  . Acute renal failure superimposed on stage 4 chronic kidney disease ([place]) 07/04/2015  . Allergic rhinitis   . Anemia   . Anxiety   . CAD in native artery    NSTEMI 07/2015 s/p DES to RCA and posterior PDA, PCI 10/2015 with scoring balloon to 85% ISR of distal RCA), known LAD/Cx disease treated medically  . Carotid artery disease ([place])    Mild bilateral carotid disease (1-39% 07/2015)  . Chronic combined systolic and diastolic CHF (congestive heart failure) ([place])   . CKD (chronic kidney disease), stage IV ([place])   . Constipation   . Essential hypertension   . GERD (gastroesophageal reflux disease)   . History of hysterectomy   . Hyperlipidemia   . Low back pain   . Obesity   . 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 Osteoarthritis   . Overactive bladder   . PVC's (premature ventricular contractions)   . Type 2 diabetes mellitus ([facility])    Medications:  Prescriptions Prior to Admission  Medication Sig Dispense Refill Last Dose  . acetaminophen (TYLENOL) 500 MG tablet Take 2 tablets (1,000 mg total) by mouth  every 6 (six) hours as needed (pain). 30 tablet 0 11/18/2016 at Unknown time  . albuterol (PROVENTIL HFA;VENTOLIN HFA) 108 (90 Base) MCG/ACT inhaler Inhale 1-2 puffs into the lungs every 6 (six) hours as needed for wheezing or shortness of breath. 1 Inhaler 0 11/19/2016 at Unknown time  . albuterol (PROVENTIL) (2.5 MG/3ML) 0.083% nebulizer solution Take 3 mLs (2.5 mg total) by nebulization every 6 (six) hours as needed for wheezing or shortness of breath. 75 mL 1 11/19/2016 at Unknown time  . ALPRAZolam (XANAX) 0.5 MG tablet Take 0.5 mg by mouth at bedtime.     . [EXPIRED] cefUROXime (CEFTIN) 500 MG tablet Take 1 tablet (500 mg total) by mouth daily with breakfast. 5 tablet 0 11/18/2016 at Unknown time  . cyclobenzaprine (FLEXERIL) 5 MG tablet Take 5 mg by mouth at bedtime.  0 Past Week at Unknown time  . diazepam (VALIUM) 5 MG tablet Take 1 tablet (5 mg total) by mouth every 12 (twelve) hours as needed for muscle spasms. 10 tablet 0 Past Week at Unknown time  . ferrous sulfate 325 (65 FE) MG tablet Take 325 mg by mouth daily with breakfast.    11/18/2016 at Unknown time  . fluticasone (FLONASE) 50 MCG/ACT nasal spray Place 1 spray into both nostrils daily.   11/18/2016 at Unknown time  . hydrALAZINE (APRESOLINE) 100 MG tablet Take 1  tablet (100 mg total) by mouth 3 (three) times daily. 120 tablet 1 11/18/2016 at Unknown time  . insulin glargine (LANTUS) 100 UNIT/ML injection Inject 40 Units into the skin at bedtime.   11/18/2016 at Unknown time  . insulin lispro (HUMALOG KWIKPEN) 100 UNIT/ML KiwkPen INJECT 18 TO 24 UNITS TOTAL INTO THE SKIN THREE TIMES DAILY 15 mL 2 11/18/2016 at Unknown time  . isosorbide mononitrate (IMDUR) 60 MG 24 hr tablet Take 1 tablet (60 mg total) by mouth daily. 30 tablet 0 11/18/2016 at Unknown time  . latanoprost (XALATAN) 0.005 % ophthalmic solution Place 1 drop into both eyes at bedtime.    11/18/2016 at Unknown time  . meclizine (ANTIVERT) 25 MG tablet Take 12.5 mg by mouth 2 (two)  times daily.    11/18/2016 at Unknown time  . metoprolol succinate (TOPROL-XL) 25 MG 24 hr tablet Take 3 tablets (75 mg total) by mouth daily. Take with or immediately following a meal. 30 tablet 2 11/18/2016 at 2200  . Multiple Vitamin (MULTIVITAMIN WITH MINERALS) TABS tablet Take 1 tablet by mouth daily.   Past Week at Unknown time  . nitroGLYCERIN (NITROSTAT) 0.4 MG SL tablet Place 1 tablet (0.4 mg total) under the tongue every 5 (five) minutes as needed for chest pain. 25 tablet 2 unknown at unknown  . pantoprazole (PROTONIX) 40 MG tablet Take 40 mg by mouth daily.    11/18/2016 at Unknown time  . potassium chloride SA (K-DUR,KLOR-CON) 20 MEQ tablet Take 2 tablets (40 mEq total) by mouth daily. 60 tablet 5 11/18/2016 at Unknown time  . RANEXA 500 MG 12 hr tablet TAKE ONE TABLET BY MOUTH TWICE DAILY 60 tablet 5 11/18/2016 at Unknown time  . rosuvastatin (CRESTOR) 40 MG tablet Take 1 tablet (40 mg total) by mouth at bedtime. 30 tablet 0 11/18/2016 at Unknown time  . ticagrelor (BRILINTA) 60 MG TABS tablet Take 1 tablet (60 mg total) by mouth 2 (two) times daily. 60 tablet 1 11/18/2016 at 2200  . torsemide (DEMADEX) 20 MG tablet Take 60 mg by mouth 2 (two) times daily. Pt is able to take an additional tablet daily for weight gain greater than 3lbs.   11/18/2016 at Unknown time  . trolamine salicylate (ASPERCREME) 10 % cream Apply 1 application topically as needed for muscle pain.   Past Week at Unknown time  . warfarin (COUMADIN) 7.5 MG tablet Take 1 tablet (7.5 mg total) by mouth once. 45 tablet 2    Assessment: 71 yo female with significant cardiac history including CHF, CAD s/p PCI 2018; CKD stage 4; now with new onset Afib (nonvalvular).  Pt was initially started on rivaroxaban but due to renal fxn was switched to WARFARIN on 9/10. INR at goal today.  INR results have been quite labile recently, more stable today.  Goal of Therapy:  INR 2-3 Monitor platelets by anticoagulation protocol: Yes   Plan:   Repeat Coumadin 5mg  X 1 today PT-INR daily Monitor for S/S of bleeding  Pricilla Larsson, [facility]  11/24/2016,1:33 PM

## 2016-11-24 NOTE — Care Management Note (Signed)
Case Management Note  Patient Details  Name: [name] MRN: [id number] Date of Birth: 1945/04/03  If discussed at Long Length of Stay Meetings, dates discussed:  11/24/2016    Sherald Barge, RN 11/24/2016, 2:11 PM

## 2016-11-24 NOTE — Progress Notes (Signed)
EKG completed and placed on pts chart.  

## 2016-11-25 ENCOUNTER — Telehealth: Payer: Self-pay | Admitting: *Deleted

## 2016-11-25 DIAGNOSIS — I480 Paroxysmal atrial fibrillation: Principal | ICD-10-CM

## 2016-11-25 LAB — BASIC METABOLIC PANEL
Anion gap: 8 (ref 5–15)
BUN: 43 mg/dL — ABNORMAL HIGH (ref 6–20)
CALCIUM: 8.5 mg/dL — AB (ref 8.9–10.3)
CO2: 29 mmol/L (ref 22–32)
CREATININE: 2.3 mg/dL — AB (ref 0.44–1.00)
Chloride: 100 mmol/L — ABNORMAL LOW (ref 101–111)
GFR, EST AFRICAN AMERICAN: 23 mL/min — AB (ref >60)
GFR, EST NON AFRICAN AMERICAN: 20 mL/min — AB (ref >60)
GLUCOSE: 124 mg/dL — AB (ref 65–99)
Potassium: 3.8 mmol/L (ref 3.5–5.1)
Sodium: 137 mmol/L (ref 135–145)

## 2016-11-25 LAB — PROTIME-INR
INR: 2.57
PROTHROMBIN TIME: 27.4 s — AB (ref 11.4–15.2)

## 2016-11-25 LAB — GLUCOSE, CAPILLARY
GLUCOSE-CAPILLARY: 174 mg/dL — AB (ref 65–99)
GLUCOSE-CAPILLARY: 110 mg/dL — AB (ref 65–99)
Glucose-Capillary: 104 mg/dL — ABNORMAL HIGH (ref 65–99)
Glucose-Capillary: 163 mg/dL — ABNORMAL HIGH (ref 65–99)

## 2016-11-25 MED ORDER — WARFARIN SODIUM 2.5 MG PO TABS
2.5000 mg | ORAL_TABLET | Freq: Once | ORAL | Status: AC
  Administered 2016-11-25: 2.5 mg via ORAL
  Filled 2016-11-25: qty 1

## 2016-11-25 MED ORDER — INSULIN GLARGINE 100 UNIT/ML ~~LOC~~ SOLN
32.0000 [IU] | Freq: Every day | SUBCUTANEOUS | Status: DC
  Administered 2016-11-25: 32 [IU] via SUBCUTANEOUS
  Filled 2016-11-25 (×3): qty 0.32

## 2016-11-25 MED ORDER — FUROSEMIDE 10 MG/ML IJ SOLN
80.0000 mg | Freq: Two times a day (BID) | INTRAMUSCULAR | Status: DC
  Administered 2016-11-25 – 2016-11-27 (×4): 80 mg via INTRAVENOUS
  Filled 2016-11-25 (×4): qty 8

## 2016-11-25 NOTE — Progress Notes (Signed)
PROGRESS NOTE                                                                                                                                                                                                             Patient Demographics:    [name], is a 71 y.o. female, DOB - 04/25/1945, UYQ:[id number]  Admit date - 11/19/2016   Admitting Physician Reyne Dumas, MD  Outpatient Primary MD for the patient is [name], MD  LOS - 6  Outpatient Specialists:Dr. Berneta Sages  Chief Complaint  Patient presents with  . Weakness  . Shortness of Breath       Brief Narrative  71 year old female with history of coronary artery disease with stenting, chronic combined systolic and diastolic CHF, right subclavian artery occlusion with thrombectomy in 07/2015 and in 09/2016, CK D stage IV, NSTEMI, uncontrolled type 2 diabetes mellitus, who was hospitalized from 9/6-9/12 with new onset A. fib and worsened heart failure. Patient was discharged home on anticoagulation on 9/12 after symptoms improved but returned back on 9/15 with A. fib with RVR and worsened renal function and worsened right pleural effusion. Patient placed in stepdown unit and underwent thoracentesis on 9/17. Cardiology consulted for CHF and persistent A. fib.   Subjective:   Heart rate remains stable. Breathing slightly better   Assessment  & Plan :   Principal problem Atrial fibrillation with RVR Rate better controlled after adding oral amiodarone and Toprol dose increased to 100 mg daily. Hydralazine and Imdur dose reduced due to soft blood pressure. Cardiology consult appreciated. -Started on Coumadin recently. CHADS2vasc of 5. INR therapeutic.  Acute on chronic systolic and diastolic CHF Although weight is down 4 pounds since admission still appears quite volume overloaded. I will increase her IV Lasix dose to 80 mg twice daily. Strict I/O and fluid  restriction to 1500 mL.  Continue hydralazine and Imdur at lower dose. Increased Toprol dose. 2-D echo this admission with EF of 30-35% with grade 2 diastolic dysfunction.  Underwent right thoracentesis on 9/17 with improvement in follow-up chest x-ray.  Acute on chronic kidney disease stage III Baseline creatinine around 2. Presented with worsening creatinine of 3. Suspect cardiorenal syndrome. Monitor closely with IV Lasix.  Coronary artery disease  History of stenting to mid RCA and distal RCA/PDA Ranexa stopped in the setting of worsened renal  function. Recent Lexiscan Myoview from 11/15/2016 shows large area of scar without ischemia. Continue medical management.   Subclavian artery occlusion Status post thrombectomy in July. Continue aspirin and [place] tie  Escherichia coli UTI Completed treatment during previous admission.  Anemia of chronic disease Stable.  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 A1c of 8.8. CBG better after insulin dose adjusted. (Increase Lantus dose and added pre-meal aspart.)   Right pleural effusion Suspected due to CHF. Cytology negative for malignancy (shows reactive mesothelial cells only). Discontinued antibiotics.  Hypokalemia Replenished    Code Status : Full code  Family Communication  : None at bedside  Disposition Plan  : Home once again the next 48 hours if CHF symptoms improve  Barriers For Discharge : Active symptoms  Consults  :  Cardiology  Procedures  :  2-D echo Right thoracentesis  DVT Prophylaxis  :  Coumadin  Lab Results  Component Value Date   PLT 412 (H) 11/21/2016    Antibiotics  :    Anti-infectives    Start     Dose/Rate Route Frequency Ordered Stop   11/24/16 1000  cefTRIAXone (ROCEPHIN) 1 g in dextrose 5 % 50 mL IVPB  Status:  Discontinued     1 g 100 mL/hr over 30 Minutes Intravenous Every 24 hours 11/23/16 1431 11/23/16 1434   11/20/16 1100  vancomycin (VANCOCIN)  IVPB 1000 mg/200 mL premix  Status:  Discontinued     1,000 mg 200 mL/hr over 60 Minutes Intravenous Every 24 hours 11/19/16 1211 11/23/16 1431   11/20/16 1000  ceFEPIme (MAXIPIME) 1 g in dextrose 5 % 50 mL IVPB  Status:  Discontinued     1 g 100 mL/hr over 30 Minutes Intravenous Every 24 hours 11/19/16 1211 11/23/16 1431   11/20/16 0000  vancomycin (VANCOCIN) 1,500 mg in sodium chloride 0.9 % 500 mL IVPB  Status:  Discontinued     1,500 mg 250 mL/hr over 120 Minutes Intravenous Every 12 hours 11/19/16 1205 11/19/16 1211   11/19/16 1215  ceFEPIme (MAXIPIME) 2 g in dextrose 5 % 50 mL IVPB  Status:  Discontinued     2 g 100 mL/hr over 30 Minutes Intravenous  Once 11/19/16 1205 11/19/16 1211   11/19/16 1015  vancomycin (VANCOCIN) 1,500 mg in sodium chloride 0.9 % 500 mL IVPB     1,500 mg 250 mL/hr over 120 Minutes Intravenous  Once 11/19/16 1000 11/19/16 1245   11/19/16 0945  ceFEPIme (MAXIPIME) 2 g in dextrose 5 % 50 mL IVPB     2 g 100 mL/hr over 30 Minutes Intravenous  Once 11/19/16 0944 11/19/16 1041   11/19/16 0945  vancomycin (VANCOCIN) IVPB 1000 mg/200 mL premix  Status:  Discontinued     1,000 mg 200 mL/hr over 60 Minutes Intravenous  Once 11/19/16 0944 11/19/16 1000        Objective:   Vitals:   11/24/16 2121 11/25/16 0521 11/25/16 0740 11/25/16 1119  BP: (!) 143/70 128/69    Pulse: 74 83  79  Resp: 18 18    Temp: 99 F (37.2 C) 98.2 F (36.8 C)    TempSrc: Oral Oral    SpO2: 100% 100% 99% 96%  Weight:  95.2 kg (209 lb 12.8 oz)    Height:        Wt Readings from Last 3 Encounters:  11/25/16 95.2 kg (209 lb 12.8 oz)  11/16/16 94.5 kg (208 lb 6.4 oz)  10/31/16 91.4 kg (201 lb 8  oz)     Intake/Output Summary (Last 24 hours) at 11/25/16 1216 Last data filed at 11/25/16 0850  Gross per 24 hour  Intake              840 ml  Output             1050 ml  Net             -210 ml     Physical Exam Gen.: Not in distress HEENT: Moist mucosa, supple neck, no  JVD Chest: Fine bibasilar crackles CVS: Normal S1 and S2, no murmurs GI: Soft, nondistended, nontender Musculoskeletal: 1+ pitting edema bilaterally        Data Review:    CBC  Recent Labs Lab 11/19/16 0815 11/21/16 0452  WBC 7.6 6.9  HGB 8.1* 8.6*  HCT 25.0* 27.1*  PLT 372 412*  MCV 95.1 97.1  MCH 30.8 30.8  MCHC 32.4 31.7  RDW 16.9* 17.4*  LYMPHSABS 0.4*  --   MONOABS 0.7  --   EOSABS 0.0  --   BASOSABS 0.0  --     Chemistries   Recent Labs Lab 11/19/16 0815  11/21/16 0452 11/22/16 0934 11/23/16 0536 11/24/16 0609 11/25/16 0615  NA 134*  < > 138 139 138 135 137  K 4.3  < > 3.9 3.5 3.1* 3.8 3.8  CL 96*  < > 101 102 102 100* 100*  CO2 25  < > 26 28 28 26 29   GLUCOSE 181*  < > 167* 106* 105* 225* 124*  BUN 67*  < > 60* 50* 48* 51* 43*  CREATININE 3.00*  < > 2.60* 2.41* 2.24* 2.40* 2.30*  CALCIUM 9.0  < > 8.6* 8.5* 8.3* 8.5* 8.5*  MG  --   --   --   --  2.0  --   --   AST 62*  --  40 29  --   --   --   ALT 54  --  40 32  --   --   --   ALKPHOS 164*  --  153* 144*  --   --   --   BILITOT 0.4  --  0.5 0.3  --   --   --   < > = values in this interval not displayed. ------------------------------------------------------------------------------------------------------------------ No results for input(s): CHOL, HDL, LDLCALC, TRIG, CHOLHDL, LDLDIRECT in the last 72 hours.  Lab Results  Component Value Date   HGBA1C 8.8 (H) 10/31/2016   ------------------------------------------------------------------------------------------------------------------ No results for input(s): TSH, T4TOTAL, T3FREE, THYROIDAB in the last 72 hours.  Invalid input(s): FREET3 ------------------------------------------------------------------------------------------------------------------ No results for input(s): VITAMINB12, FOLATE, FERRITIN, TIBC, IRON, RETICCTPCT in the last 72 hours.  Coagulation profile  Recent Labs Lab 11/21/16 0452 11/22/16 0619 11/23/16 0536  11/24/16 0609 11/25/16 0615  INR 2.89 3.10 2.38 2.23 2.57    No results for input(s): DDIMER in the last 72 hours.  Cardiac Enzymes  Recent Labs Lab 11/23/16 0536  TROPONINI <0.03   ------------------------------------------------------------------------------------------------------------------    Component Value Date/Time   BNP 648.0 (H) 10/31/2016 0528    Inpatient Medications  Scheduled Meds: . amiodarone  200 mg Oral BID  . bisacodyl  10 mg Rectal Daily  . cyclobenzaprine  5 mg Oral QHS  . fluticasone  1 spray Each Nare Daily  . furosemide  80 mg Intravenous BID  . hydrALAZINE  25 mg Oral Q8H  . insulin aspart  0-15 Units Subcutaneous TID WC  . insulin aspart  0-5  Units Subcutaneous QHS  . insulin aspart  3 Units Subcutaneous TID WC  . insulin glargine  35 Units Subcutaneous QHS  . isosorbide mononitrate  30 mg Oral Daily  . metoprolol succinate  100 mg Oral Q breakfast  . pantoprazole  40 mg Oral Daily  . polyethylene glycol  17 g Oral BID  . rosuvastatin  40 mg Oral QHS  . ticagrelor  60 mg Oral BID  . warfarin  2.5 mg Oral Once  . Warfarin - Pharmacist Dosing Inpatient   Does not apply Q24H   Continuous Infusions:  PRN Meds:.acetaminophen, albuterol, ALPRAZolam, diazepam, guaiFENesin-dextromethorphan, ondansetron (ZOFRAN) IV  Micro Results Recent Results (from the past 240 hour(s))  Gram stain     Status: None   Collection Time: 11/21/16 12:15 PM  Result Value Ref Range Status   Specimen Description PLEURAL  Final   Special Requests NONE  Final   Gram Stain NO ORGANISMS SEEN PERFORMED AT [facility] NO WBC SEEN   Final   Report Status 11/23/2016 FINAL  Final  Culture, body fluid-bottle     Status: None (Preliminary result)   Collection Time: 11/21/16 12:15 PM  Result Value Ref Range Status   Specimen Description PLEURAL  Final   Special Requests BOTTLES DRAWN AEROBIC AND ANAEROBIC 10 CC EACH  Final   Culture NO GROWTH 4 DAYS  Final   Report Status  PENDING  Incomplete    Radiology Reports Dg Chest 1 View  Result Date: 11/21/2016 CLINICAL DATA:  Pleural effusions, right greater than left. EXAM: CHEST 1 VIEW COMPARISON:  11/20/2016 FINDINGS: Right pleural effusion has been eliminated. There is a moderate left effusion which appears slightly more prominent than on the prior study. Overall heart size and pulmonary vascularity are normal. No pneumothorax after thoracentesis. IMPRESSION: No pneumothorax after right thoracentesis. No visible residual right pleural fluid. Moderate left effusion appears slightly increased. Electronically Signed   By: Lorriane Shire M.D.   On: 11/21/2016 13:08   Dg Chest 1 View  Result Date: 10/31/2016 CLINICAL DATA:  Status post right thoracentesis. EXAM: CHEST 1 VIEW COMPARISON:  10/31/2016. FINDINGS: Stable enlarged cardiac silhouette. Minimal left lower lobe atelectasis with improvement. Minimal bilateral pleural fluid, mildly improved on the left and significantly improved on the right following thoracentesis. No pneumothorax. Thoracic spine degenerative changes. IMPRESSION: 1. Almost completely resolved right pleural fluid following thoracentesis without pneumothorax. 2. Small left pleural effusion with improvement with decreased left basilar atelectasis. Electronically Signed   By: Claudie Revering M.D.   On: 10/31/2016 13:30   Dg Chest 2 View  Result Date: 11/19/2016 CLINICAL DATA:  Short of breath. Discharged 3 days ago with congestive heart failure. EXAM: CHEST  2 VIEW COMPARISON:  11/10/2016 FINDINGS: Degraded lateral view secondary to positioning and overlying artifact. Midline trachea. Normal heart size for level of inspiration. Increase in moderate right pleural effusion. No pneumothorax. Low lung volumes. Mild pulmonary venous congestion. Worsened right and developing mild left base airspace disease. IMPRESSION: Increase in moderate right pleural effusion with adjacent atelectasis or infection. Mild pulmonary  venous congestion with minimal left base atelectasis. Electronically Signed   By: Abigail Miyamoto M.D.   On: 11/19/2016 09:08   Dg Chest 2 View  Result Date: 10/31/2016 CLINICAL DATA:  Chest pain and shortness of breath.  Cough. EXAM: CHEST  2 VIEW COMPARISON:  08/07/2016 FINDINGS: There is cardiomegaly. Bilateral pleural effusions, moderate on the right and small on the left. There is vascular congestion and peribronchial cuffing suggesting pulmonary  edema. No pneumothorax. IMPRESSION: Cardiomegaly with bilateral pleural effusions and vascular congestion. Peribronchial cuffing may reflect pulmonary edema or bronchial inflammation. Electronically Signed   By: Jeb Levering M.D.   On: 10/31/2016 06:11   Ct Soft Tissue Neck Wo Contrast  Result Date: 10/29/2016 CLINICAL DATA:  Left-sided neck pain after doing exercises in cardiac rehab last week. EXAM: CT NECK WITHOUT CONTRAST TECHNIQUE: Multidetector CT imaging of the neck was performed following the standard protocol without intravenous contrast. COMPARISON:  None. FINDINGS: Pharynx and larynx: No noted mass or inflammation. Limited at the level of the larynx due to patient motion. Salivary glands: No inflammation, mass, or stone. Thyroid: Normal. Lymph nodes: No enlargement or abnormal density of cervical lymph nodes. Vascular: Limited without contrast. Moderate calcified plaque at the carotid bifurcations. Limited intracranial: Negative Visualized orbits: Partly seen. No acute finding. Right cataract resection. Mastoids and visualized paranasal sinuses: Lobulated soft tissue in the left maxillary could be polyp or lobulated retention cyst. No destructive changes in the neighboring bone. Skeleton: Negative for acute or destructive finding. There are calcified disc bulges from C2-3 to C6-7, with ligamentum flavum thickening and calcification at C4-5. These cause cord deformity at C3-4 and C4-5. Patient has undergone laminectomy at C5-6. Upper chest: Left  peritracheal soft tissue is likely multiple borderline enlarged lymph nodes rather than 1 large node or nodal conglomerate. These could be congestive given layering pleural effusions, small where seen. IMPRESSION: 1. Limited by patient motion and lack of IV contrast. No specific explanation for acute neck pain. 2. Advanced cervical spine degeneration with calcified disc bulges and ligamentum flavum thickening. 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 3. Layering pleural effusions, small where seen. 4. Polypoid soft tissue in the left maxillary sinus, nonspecific without contrast. Electronically Signed   By: Monte Fantasia M.D.   On: 10/29/2016 18:39   Ct Chest Wo Contrast  Result Date: 11/19/2016 CLINICAL DATA:  Shortness of breath. EXAM: CT CHEST WITHOUT CONTRAST TECHNIQUE: Multidetector CT imaging of the chest was performed following the standard protocol without IV contrast. COMPARISON:  Chest x-ray from same day. CT chest dated October 31, 2016. FINDINGS: Cardiovascular: Mild cardiomegaly, unchanged. New small pericardial effusion. Coronary, aortic arch, and branch vessel atherosclerotic vascular disease. Mediastinum/Nodes: Prominent and mildly enlarged mediastinal lymph nodes are stable to slightly decreased in size. No axillary lymphadenopathy. The thyroid gland, trachea, and esophagus are unremarkable. Lungs/Pleura: Large right and small left pleural effusions. Right greater than left bibasilar atelectasis. Additional atelectasis in the right middle lobe. No suspicious pulmonary nodules. No consolidation or pneumothorax. Upper Abdomen: No acute abnormality. Musculoskeletal: No chest wall mass or suspicious bone lesions identified. No fracture. Unchanged degenerative changes of the thoracic spine, worst at T7-T8 and T10-T11. IMPRESSION: 1. Large right and small left pleural effusions with adjacent right greater than left atelectasis. 2. New small  pericardial effusion. 3. Stable to slightly decreased in size mildly enlarged mediastinal lymph nodes, likely reactive. 4.  Aortic atherosclerosis (ICD10-I70.0). Electronically Signed   By: Titus Dubin M.D.   On: 11/19/2016 11:16   Ct Chest Wo Contrast  Result Date: 10/31/2016 CLINICAL DATA:  Shortness of breath with exertion EXAM: CT CHEST WITHOUT CONTRAST TECHNIQUE: Multidetector CT imaging of the chest was performed following the standard protocol without IV contrast. COMPARISON:  Chest x-ray 10/31/2016 FINDINGS: Cardiovascular: Aortic and coronary artery calcifications. No aneurysm. Mild cardiomegaly. Mediastinum/Nodes: Scattered borderline and mildly enlarged mediastinal lymph nodes. Index AP window lymph  node has a short axis diameter of 18 mm. Index right paratracheal node has a short axis diameter of 9 mm. No axillary adenopathy. Lungs/Pleura: Large right pleural effusion and small left pleural effusion. Bibasilar atelectasis. Mild vascular congestion. Upper Abdomen: Imaging into the upper abdomen shows no acute findings. Musculoskeletal: Chest wall soft tissues are unremarkable. No acute bony abnormality. IMPRESSION: Large right pleural effusion and small left effusion. Bibasilar atelectasis. Vascular congestion. Extensive coronary artery calcifications. Mildly enlarged mediastinal lymph nodes, likely reactive. Aortic Atherosclerosis (ICD10-I70.0). Electronically Signed   By: Rolm Baptise M.D.   On: 10/31/2016 07:08   Dg Chest Right Decubitus  Result Date: 11/19/2016 CLINICAL DATA:  Right pleural effusion. EXAM: CHEST - RIGHT DECUBITUS COMPARISON:  Chest x-ray from earlier today and CT of the chest November 19, 2016 FINDINGS: The right-side-down decubitus film demonstrates a layering right-sided effusion which correlates with comparison imaging. IMPRESSION: The right-sided pleural effusion layers on this study. Electronically Signed   By: Dorise Bullion III M.D   On: 11/19/2016 15:29   Nm  Myocar Multi W/spect Tamela Oddi Motion / Ef  Result Date: 11/15/2016  There was no ST segment deviation noted during stress. T wave inversions inferiorly.  Defect 1: There is a large defect of severe severity present in the basal inferolateral, basal anterolateral, mid anterior, mid anteroseptal, mid inferolateral, mid anterolateral, apical anterior, apical inferior, apical lateral and apex location.  This is a high risk study.  Findings consistent with prior myocardial infarction. No ischemic territories.  Nuclear stress EF: 21%.    Dg Chest Port 1 View  Result Date: 11/20/2016 CLINICAL DATA:  Shortness of breath EXAM: PORTABLE CHEST 1 VIEW COMPARISON:  11/19/2016 FINDINGS: Large right and small to moderate left pleural effusions causing hazy opacities obscuring the lung bases. Mild enlargement of the cardiopericardial silhouette. No findings of acute pulmonary edema. The right pleural effusion has a greater layering effect than on 11/19/2016, possibly due to lack of complete settling prior to imaging. IMPRESSION: 1. Similar appearance of large right and small to moderate left pleural effusions with passive atelectasis. 2. Mild enlargement of the cardiopericardial silhouette. Electronically Signed   By: Van Clines M.D.   On: 11/20/2016 12:09   Dg Chest Port 1 View  Result Date: 11/10/2016 CLINICAL DATA:  Shortness of breath tonight EXAM: PORTABLE CHEST 1 VIEW COMPARISON:  October 31, 2016 FINDINGS: The mediastinal contour is normal. The heart size is mildly enlarged. There is a small right pleural effusion with patchy consolidation of right lung base. The left lung is clear. No acute abnormalities identified in the visualized bones. IMPRESSION: Small right pleural effusion with patchy consolidation of right lung base, underline pneumonia is not excluded. Electronically Signed   By: Abelardo Diesel M.D.   On: 11/10/2016 21:28   US Thoracentesis Asp Pleural Space W/img Guide  Result Date:  11/21/2016 INDICATION: Right pleural effusion. EXAM: ULTRASOUND GUIDED RIGHT THORACENTESIS MEDICATIONS: None. COMPLICATIONS: None immediate. PROCEDURE: An ultrasound guided thoracentesis was thoroughly discussed with the patient and questions answered. The benefits, risks, alternatives and complications were also discussed. The patient understands and wishes to proceed with the procedure. Written consent was obtained. Ultrasound was performed to localize and mark an adequate pocket of fluid in the RIGHT chest. The area was then prepped and draped in the normal sterile fashion. 1% Lidocaine was used for local anesthesia. Under ultrasound guidance a Yueh catheter was introduced. Thoracentesis was performed. The catheter was removed and a dressing applied. FINDINGS: A total of approximately  1.7 L of dark blood-tinged fluid was removed. Samples were sent to the laboratory as requested by the clinical team. IMPRESSION: Successful ultrasound guided right thoracentesis yielding 1.7 L of pleural fluid. Electronically Signed   By: Lorriane Shire M.D.   On: 11/21/2016 13:09   US Thoracentesis Asp Pleural Space W/img Guide  Result Date: 10/31/2016 INDICATION: Right pleural effusion. EXAM: ULTRASOUND GUIDED RIGHT THORACENTESIS MEDICATIONS: None. COMPLICATIONS: None immediate. PROCEDURE: An ultrasound guided thoracentesis was thoroughly discussed with the patient and questions answered. The benefits, risks, alternatives and complications were also discussed. The patient understands and wishes to proceed with the procedure. Written consent was obtained. Ultrasound was performed to localize and mark an adequate pocket of fluid in the right chest. The area was then prepped and draped in the normal sterile fashion. 1% Lidocaine was used for local anesthesia. Under ultrasound guidance a Yuen catheter was introduced. Thoracentesis was performed. The catheter was removed and a dressing applied. FINDINGS: A total of approximately 1  L of blood-tinged fluid was removed. Samples were sent to the laboratory as requested by the clinical team. IMPRESSION: Successful ultrasound guided right thoracentesis yielding 1 L of pleural fluid. The patient tolerated the procedure well. No immediate complications. Electronically Signed   By: Lorriane Shire M.D.   On: 10/31/2016 13:09    Time Spent in minutes  25   Louellen Molder M.D on 11/25/2016 at 12:16 PM  Between 7am to 7pm - Pager - [phone]  After 7pm go to [email or web address] - password [facility]  [facility] -  Office  [phone]

## 2016-11-25 NOTE — Patient Outreach (Signed)
[facility]) Care Management  11/25/2016  [name] March 28, 1945 [id number]   Care coordination  [facility] CM spoke with Vania Rea [facility] CM Liaison about CM Community referral for Mrs Rozeboom on 11/22/16 and 11/23/16  Mrs Hyden per EPIC notes remains in [place] [facility] at this time and will be discharged per hospital CM over the weekend  Plans  [facility] CM will initiated Transition of care services for Mrs Godfrey on next week  Faiz Weber L. Lavina Hamman, RN, BSN, [place] [facility] Management [phone]

## 2016-11-25 NOTE — Progress Notes (Signed)
ANTICOAGULATION CONSULT NOTE - follow up  Pharmacy Consult for coumadin Indication: atrial fibrillation  Allergies  Allergen Reactions  . Ace Inhibitors Cough  . Amlodipine     Edema   . Codeine Rash   Patient Measurements: Height: 5\' 8"  (172.7 cm) Weight: 209 lb 12.8 oz (95.2 kg) IBW/kg (Calculated) : 63.9  Vital Signs: Temp: 98.2 F (36.8 C) (09/21 0521) Temp Source: Oral (09/21 0521) BP: 128/69 (09/21 0521) Pulse Rate: 83 (09/21 0521)  Labs:  Recent Labs  11/23/16 0536 11/24/16 0609 11/25/16 0615  LABPROT 25.8* 24.5* 27.4*  INR 2.38 2.23 2.57  CREATININE 2.24* 2.40* 2.30*  TROPONINI <0.03  --   --    Estimated Creatinine Clearance: 27.1 mL/min (A) (by C-G formula based on SCr of 2.3 mg/dL (H)).  Medical History: Past Medical History:  Diagnosis Date  . Acute renal failure superimposed on stage 4 chronic kidney disease ([place]) 07/04/2015  . Allergic rhinitis   . Anemia   . Anxiety   . CAD in native artery    NSTEMI 07/2015 s/p DES to RCA and posterior PDA, PCI 10/2015 with scoring balloon to 85% ISR of distal RCA), known LAD/Cx disease treated medically  . Carotid artery disease ([place])    Mild bilateral carotid disease (1-39% 07/2015)  . Chronic combined systolic and diastolic CHF (congestive heart failure) ([place])   . CKD (chronic kidney disease), stage IV ([place])   . Constipation   . Essential hypertension   . GERD (gastroesophageal reflux disease)   . History of hysterectomy   . Hyperlipidemia   . Low back pain   . Obesity   . 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 Osteoarthritis   . Overactive bladder   . PVC's (premature ventricular contractions)   . Type 2 diabetes mellitus (H[facility])    Medications:  Prescriptions Prior to Admission  Medication Sig Dispense Refill Last Dose  . acetaminophen (TYLENOL) 500 MG tablet Take 2 tablets (1,000 mg total) by mouth every 6 (six) hours as  needed (pain). 30 tablet 0 11/18/2016 at Unknown time  . albuterol (PROVENTIL HFA;VENTOLIN HFA) 108 (90 Base) MCG/ACT inhaler Inhale 1-2 puffs into the lungs every 6 (six) hours as needed for wheezing or shortness of breath. 1 Inhaler 0 11/19/2016 at Unknown time  . albuterol (PROVENTIL) (2.5 MG/3ML) 0.083% nebulizer solution Take 3 mLs (2.5 mg total) by nebulization every 6 (six) hours as needed for wheezing or shortness of breath. 75 mL 1 11/19/2016 at Unknown time  . ALPRAZolam (XANAX) 0.5 MG tablet Take 0.5 mg by mouth at bedtime.     . [EXPIRED] cefUROXime (CEFTIN) 500 MG tablet Take 1 tablet (500 mg total) by mouth daily with breakfast. 5 tablet 0 11/18/2016 at Unknown time  . cyclobenzaprine (FLEXERIL) 5 MG tablet Take 5 mg by mouth at bedtime.  0 Past Week at Unknown time  . diazepam (VALIUM) 5 MG tablet Take 1 tablet (5 mg total) by mouth every 12 (twelve) hours as needed for muscle spasms. 10 tablet 0 Past Week at Unknown time  . ferrous sulfate 325 (65 FE) MG tablet Take 325 mg by mouth daily with breakfast.    11/18/2016 at Unknown time  . fluticasone (FLONASE) 50 MCG/ACT nasal spray Place 1 spray into both nostrils daily.   11/18/2016 at Unknown time  . hydrALAZINE (APRESOLINE) 100 MG tablet Take 1 tablet (100 mg total) by mouth 3 (three) times daily. 120 tablet  1 11/18/2016 at Unknown time  . insulin glargine (LANTUS) 100 UNIT/ML injection Inject 40 Units into the skin at bedtime.   11/18/2016 at Unknown time  . insulin lispro (HUMALOG KWIKPEN) 100 UNIT/ML KiwkPen INJECT 18 TO 24 UNITS TOTAL INTO THE SKIN THREE TIMES DAILY 15 mL 2 11/18/2016 at Unknown time  . isosorbide mononitrate (IMDUR) 60 MG 24 hr tablet Take 1 tablet (60 mg total) by mouth daily. 30 tablet 0 11/18/2016 at Unknown time  . latanoprost (XALATAN) 0.005 % ophthalmic solution Place 1 drop into both eyes at bedtime.    11/18/2016 at Unknown time  . meclizine (ANTIVERT) 25 MG tablet Take 12.5 mg by mouth 2 (two) times daily.     11/18/2016 at Unknown time  . metoprolol succinate (TOPROL-XL) 25 MG 24 hr tablet Take 3 tablets (75 mg total) by mouth daily. Take with or immediately following a meal. 30 tablet 2 11/18/2016 at 2200  . Multiple Vitamin (MULTIVITAMIN WITH MINERALS) TABS tablet Take 1 tablet by mouth daily.   Past Week at Unknown time  . nitroGLYCERIN (NITROSTAT) 0.4 MG SL tablet Place 1 tablet (0.4 mg total) under the tongue every 5 (five) minutes as needed for chest pain. 25 tablet 2 unknown at unknown  . pantoprazole (PROTONIX) 40 MG tablet Take 40 mg by mouth daily.    11/18/2016 at Unknown time  . potassium chloride SA (K-DUR,KLOR-CON) 20 MEQ tablet Take 2 tablets (40 mEq total) by mouth daily. 60 tablet 5 11/18/2016 at Unknown time  . RANEXA 500 MG 12 hr tablet TAKE ONE TABLET BY MOUTH TWICE DAILY 60 tablet 5 11/18/2016 at Unknown time  . rosuvastatin (CRESTOR) 40 MG tablet Take 1 tablet (40 mg total) by mouth at bedtime. 30 tablet 0 11/18/2016 at Unknown time  . ticagrelor (BRILINTA) 60 MG TABS tablet Take 1 tablet (60 mg total) by mouth 2 (two) times daily. 60 tablet 1 11/18/2016 at 2200  . torsemide (DEMADEX) 20 MG tablet Take 60 mg by mouth 2 (two) times daily. Pt is able to take an additional tablet daily for weight gain greater than 3lbs.   11/18/2016 at Unknown time  . trolamine salicylate (ASPERCREME) 10 % cream Apply 1 application topically as needed for muscle pain.   Past Week at Unknown time  . warfarin (COUMADIN) 7.5 MG tablet Take 1 tablet (7.5 mg total) by mouth once. 45 tablet 2    Assessment: 71 yo female with significant cardiac history including CHF, CAD s/p PCI 2018; CKD stage 4; now with new onset Afib (nonvalvular).  Pt was initially started on rivaroxaban but due to renal fxn was switched to WARFARIN on 9/10. INR at goal today.  Goal of Therapy:  INR 2-3 Monitor platelets by anticoagulation protocol: Yes   Plan:  Coumadin 2.5mg  X 1 today PT-INR daily Monitor for S/S of bleeding  Rondy Krupinski,  Shateka Petrea Poteet, RPH  11/25/2016,10:31 AM

## 2016-11-25 NOTE — Progress Notes (Signed)
SATURATION QUALIFICATIONS: (This note is used to comply with regulatory documentation for home oxygen)  Patient Saturations on Room Air at Rest = 96%  Patient Saturations on Room Air while Ambulating = 98%  Patient saturations were adequate while ambulating. Pt ambulated approx. 50 feet. Slightly short of breath, otherwise WNL.

## 2016-11-25 NOTE — Care Management Note (Signed)
Case Management Note  Patient Details  Name: [name] MRN: [id number] Date of Birth: 1945/08/13  Expected Discharge Date:  11/15/16               Expected Discharge Plan:  [place]  In-House Referral:  NA  Discharge planning Services  CM Consult  Post Acute Care Choice:  Home Health, Resumption of Svcs/PTA Provider Choice offered to:    Pt  HH Arranged:  RN, PT [facility] Agency:  [facility]  Status of Service:  Completed, signed off   Additional Comments: Pt will DC home over weekend. She will resume HH services through [facility], Agency aware of DC over weekend, RN will call and verify DC with [facility] at DC. Pt aware HH has 48 hrs to make first visit. Pt does not qualify for cont oxygen at DC.   Sherald Barge, RN 11/25/2016, 1:34 PM

## 2016-11-25 NOTE — Care Management (Signed)
Patient Information   Patient Name Jenya, Putz ([id number]) Sex Female DOB Nov 03, 1945  Room Bed  A324 A324-01  Patient Demographics   Address [place] [address] Phone [phone] [facility]) [phone] (Mobile) E-mail Address [email or web address]  Patient Ethnicity & Race   Ethnic Group Patient Race  Not Hispanic or Latino Black or African American  Emergency Contact(s)   Name Relation Home Work Mobile  Witter,Lawrence Spouse [phone]  [phone]  [place] D Daughter [phone]    Cordelia Pen [phone]    Documents on File    Status Date Received Description  Documents for the Patient  EMR Medication Summary Not Received    EMR Problem Summary Not Received    EMR Patient Summary Not Received    Historic Radiology Documentation Not Received    [place] Not Received    [place] E-Signature HIPAA Notice of Privacy Received 01/21/11   [place] E-Signature HIPAA Notice of Privacy Spanish Not Received    Driver's License Not Received    Insurance Card Not Received    Advance Directives/Living Will/HCPOA/POA Not Received    Editor, commissioning Not Received    Insurance Card Not Received    [place] Received 09/20/11 gi-wmc  Insurance Card Received 09/19/13 medicare,bcbs  Insurance Card Not Received    [place] HIPAA NOTICE OF PRIVACY - Scanned Not Received    Insurance Card Received 12/04/14 BCBS/MCR 2016  Insurance Card Received 08/03/12 bcbs/mcr  Release of Information Not Received    HIM ROI Authorization  02/21/13   HIM ROI Authorization (Expired) 09/23/13 Patient in office now. Need colonoscopy done in 2002.  Release of Information  09/26/13   HIM ROI Authorization  10/01/13   AMB Correspondence  03/04/14 REFERRAL NIDA MD, G  Insurance Card Received 03/12/14 BCBS  AMB Correspondence  03/04/14 DIAB EDU PATIENT LETTER [facility]   Other Photo ID Not Received    Advanced Beneficiary Notice (ABN) Not Received    E-Signature AOB Spanish Not Received    [place] E-Signature HIPAA Notice of Privacy Received 12/04/14   Insurance Card Received 12/12/14 wmc  [place] HIPAA NOTICE OF PRIVACY - Scanned Received 12/12/14 wmc  AMB Patient Logs/Info  04/07/15   AMB Correspondence  05/06/15 OFFICE NOTE [facility]  AMB Patient Logs/Info  98/92/11   VVS Policy for Pain - E Signature     Insurance Card Received 07/27/15 medicare, bcbs 2017  AMB Correspondence  05/26/15 OFFICE NOTES [facility]  HIM ROI Authorization  08/17/15 pa pp  HIM ROI Authorization  09/28/15 [place]  AMB Correspondence  08/06/15 POC MADDY OTR CHT, L  AMB Outside Hospital Record  10/09/15 D/S [facility]  AMB Correspondence  08/18/15 OFFICE NOTE [facility]  AMB Patient Logs/Info  12/03/15 08/17-09/17  AMB Correspondence  09/03/15 OCCUPATIONAL THERAPY POC MADDY OTR, L  AMB Correspondence  09/03/15 POC MADDY OTR, L  HIM ROI Authorization  01/08/16 [place]  AMB Correspondence  09/18/15 OFFICE NOTES [facility]  AMB Patient Logs/Info  03/09/16   AMB Correspondence  02/12/16 OFFICE NOTE [facility]  AMB Correspondence  02/12/16 OFFICE NOTES [facility]  [place] E-Signature HIPAA Notice of Privacy Signed 04/01/16   AMB Patient Logs/Info  04/05/16   AMB Correspondence  04/13/16 OFFICE NOTE [facility]  AMB Correspondence  10/06/15 PROGRESS NOTE PT [facility]  AMB  Correspondence  10/07/15 END OF CARE PT AND HAND  AMB Correspondence  06/13/16 [facility]  AMB Correspondence  06/13/16 OFFICE VISIT  AMB Correspondence  04/13/16 [facility]  AMB Correspondence  06/13/16 [facility]  HIM ROI Authorization  08/15/16 PA-KDT  Insurance Card Received 08/16/16 NEW MEDICARE 2018  HIM ROI Authorization  08/24/16 [place]  [facility]  HIM ROI Authorization  08/24/16 [place]  AMB Patient Logs/Info  08/30/16   HIM ROI Authorization  09/05/16 [facility]  AMB Correspondence  08/16/16 [facility]  AMB Patient Logs/Info  10/16/16   AMB Correspondence  10/17/16 [facility]  AMB Correspondence (Deleted) 09/29/15 POC Trula Slade MD, V  Documents for the Encounter  AOB (Assignment of Insurance Benefits) Not Received    E-signature AOB Signed 11/19/16   MEDICARE RIGHTS Not Received    E-signature Medicare Rights Signed 11/19/16   Cardiac Monitoring Strip Shift Summary  11/19/16   ED Patient Billing Extract   ED PB Summary  Cardiac Monitoring Strip  11/19/16   Ultrasound  11/21/16   Consent Form  11/23/16   Cardiac Monitoring Strip - Scanned  11/24/16   EKG  11/21/16   Admission Information   Attending Provider Admitting Provider Admission Type Admission Date/Time  Louellen Molder, MD Reyne Dumas, MD Emergency 11/19/16 [phone]  Discharge Date Hospital Service Auth/Cert Status Service Area   Internal Medicine Incomplete [place]  Unit Room/Bed Admission Status   AP-DEPT 300 A324/A324-01 Admission (Confirmed)   Admission   Complaint  sob wheezing  Hospital Account   Name Acct ID Class Status Primary Coverage  [place], [employer] [id number] Inpatient Open [place]      Guarantor Account (for Hospital Account [id number])   Name Relation to Pt Service Area Active? Acct Type  Sharlot Gowda Self CHSA Yes Personal/Family  [place] [address] [phone])        Coverage Information (for Hospital Account [id number])   1. BLUE CROSS BLUE SHIELD/BCBS OTHER   F/O Payor/Plan Precert #  BLUE CROSS BLUE SHIELD/BCBS OTHER   Subscriber Subscriber #  Reveca, Desmarais [id number]  Address Phone  [address], [place] [address] [phone]  2. [place] PART A AND B    F/O Payor/Plan Precert #  MEDICARE/MEDICARE PART A AND B   Subscriber Subscriber #  Danitza, [place] [id number]  Address Phone  [address] [place], [place] [address]

## 2016-11-25 NOTE — Progress Notes (Signed)
Progress Note  Patient Name: [name] Date of Encounter: 11/25/2016  Primary Cardiologist: Dr. Kate Sable  Subjective   Sitting in bed side chair this morning. Has not ambulated yet. Less short of breath. No chest pain or palpitations.  Inpatient Medications    Scheduled Meds: . amiodarone  200 mg Oral BID  . bisacodyl  10 mg Rectal Daily  . cyclobenzaprine  5 mg Oral QHS  . fluticasone  1 spray Each Nare Daily  . furosemide  80 mg Intravenous BID  . hydrALAZINE  25 mg Oral Q8H  . insulin aspart  0-15 Units Subcutaneous TID WC  . insulin aspart  0-5 Units Subcutaneous QHS  . insulin aspart  3 Units Subcutaneous TID WC  . insulin glargine  35 Units Subcutaneous QHS  . isosorbide mononitrate  30 mg Oral Daily  . metoprolol succinate  100 mg Oral Q breakfast  . pantoprazole  40 mg Oral Daily  . polyethylene glycol  17 g Oral BID  . rosuvastatin  40 mg Oral QHS  . ticagrelor  60 mg Oral BID  . Warfarin - Pharmacist Dosing Inpatient   Does not apply Q24H    PRN Meds: acetaminophen, albuterol, ALPRAZolam, diazepam, guaiFENesin-dextromethorphan, ondansetron (ZOFRAN) IV   Vital Signs    Vitals:   11/24/16 1444 11/24/16 2121 11/25/16 0521 11/25/16 0740  BP: (!) 119/55 (!) 143/70 128/69   Pulse: 74 74 83   Resp: 16 18 18    Temp: 98.4 F (36.9 C) 99 F (37.2 C) 98.2 F (36.8 C)   TempSrc: Oral Oral Oral   SpO2: 100% 100% 100% 99%  Weight:   209 lb 12.8 oz (95.2 kg)   Height:        Intake/Output Summary (Last 24 hours) at 11/25/16 1028 Last data filed at 11/25/16 0850  Gross per 24 hour  Intake              840 ml  Output             1250 ml  Net             -410 ml   Filed Weights   11/22/16 0500 11/24/16 0900 11/25/16 0521  Weight: 212 lb 8.4 oz (96.4 kg) 216 lb (98 kg) 209 lb 12.8 oz (95.2 kg)    Telemetry    Sinus rhythm with lead artifact (no VT). Personally reviewed.  ECG    Tracing from 11/24/2016 shows sinus rhythm with low  voltage in the limb leads and nonspecific ST-T changes. Personally reviewed.  Physical Exam   GEN: Overweight woman. No acute distress.   Neck: No JVD. Cardiac: RRR, no gallop.  Respiratory: Decreased breath sounds left base. No wheezing. GI: Soft, nontender, bowel sounds present. MS: No edema; No deformity.  Labs    Chemistry Recent Labs Lab 11/19/16 0815  11/21/16 0452 11/22/16 0934 11/23/16 0536 11/24/16 0609 11/25/16 0615  NA 134*  < > 138 139 138 135 137  K 4.3  < > 3.9 3.5 3.1* 3.8 3.8  CL 96*  < > 101 102 102 100* 100*  CO2 25  < > 26 28 28 26 29   GLUCOSE 181*  < > 167* 106* 105* 225* 124*  BUN 67*  < > 60* 50* 48* 51* 43*  CREATININE 3.00*  < > 2.60* 2.41* 2.24* 2.40* 2.30*  CALCIUM 9.0  < > 8.6* 8.5* 8.3* 8.5* 8.5*  PROT 7.6  --  6.8 6.8  --   --   --  ALBUMIN 2.9*  --  2.6* 2.6*  --   --   --   AST 62*  --  40 29  --   --   --   ALT 54  --  40 32  --   --   --   ALKPHOS 164*  --  153* 144*  --   --   --   BILITOT 0.4  --  0.5 0.3  --   --   --   GFRNONAA 15*  < > 17* 19* 21* 19* 20*  GFRAA 17*  < > 20* 22* 24* 22* 23*  ANIONGAP 13  < > 11 9 8 9 8   < > = values in this interval not displayed.   Hematology Recent Labs Lab 11/19/16 0815 11/21/16 0452  WBC 7.6 6.9  RBC 2.63* 2.79*  HGB 8.1* 8.6*  HCT 25.0* 27.1*  MCV 95.1 97.1  MCH 30.8 30.8  MCHC 32.4 31.7  RDW 16.9* 17.4*  PLT 372 412*    Cardiac Enzymes Recent Labs Lab 11/23/16 0536  TROPONINI <0.03    Recent Labs Lab 11/19/16 0856  TROPIPOC 0.10*     Cardiac Studies   Echocardiogram 11/11/2016: Study Conclusions  - Left ventricle: The cavity size was normal. Wall thickness was   increased in a pattern of moderate LVH. Systolic function was   moderately to severely reduced. The estimated ejection fraction   was in the range of 30% to 35%. Features are consistent with a   pseudonormal left ventricular filling pattern, with concomitant   abnormal relaxation and increased filling  pressure (grade 2   diastolic dysfunction). Doppler parameters are consistent with   high ventricular filling pressure. - Regional wall motion abnormality: Hypokinesis of the mid inferior   and basal-mid inferolateral myocardium. - Aortic valve: Mildly calcified annulus. Trileaflet; mildly   thickened leaflets. Valve area (VTI): 1.57 cm^2. Valve area   (Vmax): 1.57 cm^2. Valve area (Vmean): 1.51 cm^2. - Mitral valve: Mildly calcified annulus. Mildly thickened leaflets   . There was mild regurgitation. - Left atrium: The atrium was severely dilated. - Right atrium: The atrium was moderately dilated. - Pulmonary arteries: Systolic pressure was moderately increased.   PA peak pressure: 44 mm Hg (S).  Patient Profile     71 y.o. female with history of CAD with occluded circumflex, previous stent interventions to the RCA and PDA, and moderate LAD disease that is being managed medically. Also CKD stage IV, type 2 diabetes mellitus, PAD, and recently documented ischemic cardiomyopathy with LVEF 30-35% as well as paroxysmal atrial fibrillation with RVR.  Assessment & Plan    1. Paroxysmal atrial fibrillation with CHADSVASC score of 5. She is maintaining sinus rhythm now on low-dose amiodarone load, also continues on Coumadin for stroke prophylaxis and Toprol-XL.  2. Acute on chronic combined heart failure with LVEF 30-35%. She is status post recent right thoracentesis and has residual left pleural effusion, continues to diurese on IV Lasix. Weight is down about 4-5 pounds.  3. Ischemic cardiomyopathy with LVEF 30-35% by recent echocardiogram. She is on hydralazine, Imdur, Toprol-XL, and Lasix. No ACE inhibitor or ARB with chronic kidney disease.  4. CKD stage IV, creatinine down to 2.3.  5. Multivessel CAD as outlined above, no active angina symptoms at this time. She remains on Brilinta (no ASA since on Coumadin).  6. Relative deconditioning.  Patient has been generally improving. Needs to  increase activity, suggest PT evaluation, would also check ambulatory oxygen saturation  to make sure that she does not need oxygen supplementation. Continue amiodarone at 200 mg twice daily for total of 7 days and then reduce to 200 mg once daily. Otherwise continue current cardiac regimen with plan to convert to oral Lasix in the next 24-48 hours. Following discharge she will need close follow-up with Dr. Bronson Ing or APP.  Signed, Rozann Lesches, MD  11/25/2016, 10:28 AM

## 2016-11-25 NOTE — Care Management Important Message (Signed)
Important Message  Patient Details  Name: [name] MRN: [id number] Date of Birth: 05-20-1945   Medicare Important Message Given:  Yes    Sherald Barge, RN 11/25/2016, 1:34 PM

## 2016-11-26 DIAGNOSIS — I10 Essential (primary) hypertension: Secondary | ICD-10-CM

## 2016-11-26 DIAGNOSIS — J90 Pleural effusion, not elsewhere classified: Secondary | ICD-10-CM

## 2016-11-26 LAB — CULTURE, BODY FLUID W GRAM STAIN -BOTTLE: Culture: NO GROWTH

## 2016-11-26 LAB — BASIC METABOLIC PANEL
Anion gap: 10 (ref 5–15)
BUN: 43 mg/dL — AB (ref 6–20)
CALCIUM: 8.7 mg/dL — AB (ref 8.9–10.3)
CHLORIDE: 101 mmol/L (ref 101–111)
CO2: 28 mmol/L (ref 22–32)
CREATININE: 2.17 mg/dL — AB (ref 0.44–1.00)
GFR calc non Af Amer: 22 mL/min — ABNORMAL LOW (ref >60)
GFR, EST AFRICAN AMERICAN: 25 mL/min — AB (ref >60)
Glucose, Bld: 115 mg/dL — ABNORMAL HIGH (ref 65–99)
Potassium: 3.4 mmol/L — ABNORMAL LOW (ref 3.5–5.1)
SODIUM: 139 mmol/L (ref 135–145)

## 2016-11-26 LAB — GLUCOSE, CAPILLARY
GLUCOSE-CAPILLARY: 49 mg/dL — AB (ref 65–99)
GLUCOSE-CAPILLARY: 116 mg/dL — AB (ref 65–99)
GLUCOSE-CAPILLARY: 163 mg/dL — AB (ref 65–99)
Glucose-Capillary: 105 mg/dL — ABNORMAL HIGH (ref 65–99)
Glucose-Capillary: 130 mg/dL — ABNORMAL HIGH (ref 65–99)
Glucose-Capillary: 108 mg/dL — ABNORMAL HIGH (ref 65–99)

## 2016-11-26 LAB — PROTIME-INR
INR: 2.99
PROTHROMBIN TIME: 30.8 s — AB (ref 11.4–15.2)

## 2016-11-26 MED ORDER — INSULIN GLARGINE 100 UNIT/ML ~~LOC~~ SOLN
30.0000 [IU] | Freq: Every day | SUBCUTANEOUS | Status: DC
  Filled 2016-11-26 (×2): qty 0.3

## 2016-11-26 MED ORDER — POTASSIUM CHLORIDE CRYS ER 20 MEQ PO TBCR
30.0000 meq | EXTENDED_RELEASE_TABLET | Freq: Two times a day (BID) | ORAL | Status: AC
  Administered 2016-11-26: 30 meq via ORAL
  Filled 2016-11-26: qty 1

## 2016-11-26 NOTE — Progress Notes (Signed)
ANTICOAGULATION CONSULT NOTE - follow up  Pharmacy Consult for coumadin Indication: atrial fibrillation  Allergies  Allergen Reactions  . Ace Inhibitors Cough  . Amlodipine     Edema   . Codeine Rash   Patient Measurements: Height: 5\' 8"  (172.7 cm) Weight: 209 lb 12.6 oz (95.2 kg) IBW/kg (Calculated) : 63.9  Vital Signs: Temp: 98.2 F (36.8 C) (09/22 0638) Temp Source: Oral (09/22 7829) BP: 145/66 (09/22 5621) Pulse Rate: 76 (09/22 3086)  Labs:  Recent Labs  11/24/16 0609 11/25/16 0615 11/26/16 0718  LABPROT 24.5* 27.4* 30.8*  INR 2.23 2.57 2.99  CREATININE 2.40* 2.30* 2.17*   Estimated Creatinine Clearance: 28.7 mL/min (A) (by C-G formula based on SCr of 2.17 mg/dL (H)).  Medical History: Past Medical History:  Diagnosis Date  . Acute renal failure superimposed on stage 4 chronic kidney disease ([place]) 07/04/2015  . Allergic rhinitis   . Anemia   . Anxiety   . CAD in native artery    NSTEMI 07/2015 s/p DES to RCA and posterior PDA, PCI 10/2015 with scoring balloon to 85% ISR of distal RCA), known LAD/Cx disease treated medically  . Carotid artery disease ([place])    Mild bilateral carotid disease (1-39% 07/2015)  . Chronic combined systolic and diastolic CHF (congestive heart failure) ([place])   . CKD (chronic kidney disease), stage IV ([place])   . Constipation   . Essential hypertension   . GERD (gastroesophageal reflux disease)   . History of hysterectomy   . Hyperlipidemia   . Low back pain   . Obesity   . 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 Osteoarthritis   . Overactive bladder   . PVC's (premature ventricular contractions)   . Type 2 diabetes mellitus (HCC)    Medications:  Prescriptions Prior to Admission  Medication Sig Dispense Refill Last Dose  . acetaminophen (TYLENOL) 500 MG tablet Take 2 tablets (1,000 mg total) by mouth every 6 (six) hours as needed (pain). 30 tablet 0  11/18/2016 at Unknown time  . albuterol (PROVENTIL HFA;VENTOLIN HFA) 108 (90 Base) MCG/ACT inhaler Inhale 1-2 puffs into the lungs every 6 (six) hours as needed for wheezing or shortness of breath. 1 Inhaler 0 11/19/2016 at Unknown time  . albuterol (PROVENTIL) (2.5 MG/3ML) 0.083% nebulizer solution Take 3 mLs (2.5 mg total) by nebulization every 6 (six) hours as needed for wheezing or shortness of breath. 75 mL 1 11/19/2016 at Unknown time  . ALPRAZolam (XANAX) 0.5 MG tablet Take 0.5 mg by mouth at bedtime.     . [EXPIRED] cefUROXime (CEFTIN) 500 MG tablet Take 1 tablet (500 mg total) by mouth daily with breakfast. 5 tablet 0 11/18/2016 at Unknown time  . cyclobenzaprine (FLEXERIL) 5 MG tablet Take 5 mg by mouth at bedtime.  0 Past Week at Unknown time  . diazepam (VALIUM) 5 MG tablet Take 1 tablet (5 mg total) by mouth every 12 (twelve) hours as needed for muscle spasms. 10 tablet 0 Past Week at Unknown time  . ferrous sulfate 325 (65 FE) MG tablet Take 325 mg by mouth daily with breakfast.    11/18/2016 at Unknown time  . fluticasone (FLONASE) 50 MCG/ACT nasal spray Place 1 spray into both nostrils daily.   11/18/2016 at Unknown time  . hydrALAZINE (APRESOLINE) 100 MG tablet Take 1 tablet (100 mg total) by mouth 3 (three) times daily. 120 tablet 1 11/18/2016 at Unknown time  . insulin glargine (  LANTUS) 100 UNIT/ML injection Inject 40 Units into the skin at bedtime.   11/18/2016 at Unknown time  . insulin lispro (HUMALOG KWIKPEN) 100 UNIT/ML KiwkPen INJECT 18 TO 24 UNITS TOTAL INTO THE SKIN THREE TIMES DAILY 15 mL 2 11/18/2016 at Unknown time  . isosorbide mononitrate (IMDUR) 60 MG 24 hr tablet Take 1 tablet (60 mg total) by mouth daily. 30 tablet 0 11/18/2016 at Unknown time  . latanoprost (XALATAN) 0.005 % ophthalmic solution Place 1 drop into both eyes at bedtime.    11/18/2016 at Unknown time  . meclizine (ANTIVERT) 25 MG tablet Take 12.5 mg by mouth 2 (two) times daily.    11/18/2016 at Unknown time  .  metoprolol succinate (TOPROL-XL) 25 MG 24 hr tablet Take 3 tablets (75 mg total) by mouth daily. Take with or immediately following a meal. 30 tablet 2 11/18/2016 at 2200  . Multiple Vitamin (MULTIVITAMIN WITH MINERALS) TABS tablet Take 1 tablet by mouth daily.   Past Week at Unknown time  . nitroGLYCERIN (NITROSTAT) 0.4 MG SL tablet Place 1 tablet (0.4 mg total) under the tongue every 5 (five) minutes as needed for chest pain. 25 tablet 2 unknown at unknown  . pantoprazole (PROTONIX) 40 MG tablet Take 40 mg by mouth daily.    11/18/2016 at Unknown time  . potassium chloride SA (K-DUR,KLOR-CON) 20 MEQ tablet Take 2 tablets (40 mEq total) by mouth daily. 60 tablet 5 11/18/2016 at Unknown time  . RANEXA 500 MG 12 hr tablet TAKE ONE TABLET BY MOUTH TWICE DAILY 60 tablet 5 11/18/2016 at Unknown time  . rosuvastatin (CRESTOR) 40 MG tablet Take 1 tablet (40 mg total) by mouth at bedtime. 30 tablet 0 11/18/2016 at Unknown time  . ticagrelor (BRILINTA) 60 MG TABS tablet Take 1 tablet (60 mg total) by mouth 2 (two) times daily. 60 tablet 1 11/18/2016 at 2200  . torsemide (DEMADEX) 20 MG tablet Take 60 mg by mouth 2 (two) times daily. Pt is able to take an additional tablet daily for weight gain greater than 3lbs.   11/18/2016 at Unknown time  . trolamine salicylate (ASPERCREME) 10 % cream Apply 1 application topically as needed for muscle pain.   Past Week at Unknown time  . warfarin (COUMADIN) 7.5 MG tablet Take 1 tablet (7.5 mg total) by mouth once. 45 tablet 2    Assessment: 71 yo female with significant cardiac history including CHF, CAD s/p PCI 2018; CKD stage 4; now with new onset Afib (nonvalvular).  Pt was initially started on rivaroxaban but due to renal fxn was switched to WARFARIN on 9/10. INR at goal today but has trended up last few days. Likely she will need lower dose than previously due to addition of amiodarone.  Goal of Therapy:  INR 2-3 Monitor platelets by anticoagulation protocol: Yes   Plan:   No coumadin today PT-INR daily Monitor for S/S of bleeding  Beverlee Nims, RPH  11/26/2016,8:16 AM

## 2016-11-26 NOTE — Progress Notes (Signed)
PROGRESS NOTE    [name]  KYH:[id number] DOB: 1945/06/01 DOA: 11/19/2016 PCP: [name], MD  Primary cardiologist Dr. Kate Sable    Brief Narrative: 71 year old female with history of coronary artery disease with stenting, chronic combined systolic and diastolic CHF, right subclavian artery occlusion with thrombectomy in 07/2015 and in 09/2016, CKD stage III-IV, NSTEMI, uncontrolled type 2 diabetes mellitus, and right pleural effusion who was hospitalized from 11/10/16-11/16/16 with new onset A. fib, HCAP, and worsened heart failure which caused a right pleural effusion-status post thoracentesis. She was discharged home on anticoagulation on 11/16/16 after her symptoms improved however, she returned on 9/15 with with a complaint SOB and found to be in A. fib with RVR, worsened renal function and worsened right pleural effusion. She was admitted for further evaluation and management.   Assessment & Plan:   Principal Problem:   Atrial fibrillation with RVR (HCC) Active Problems:   Acute on chronic combined systolic and diastolic CHF (congestive heart failure) (HCC)   Pleural effusion on right   Ischemic cardiomyopathy   Hyperlipemia   Essential hypertension   Coronary artery disease   GERD   Type 2 diabetes mellitus with stage 4 chronic kidney disease, with long-term current use of insulin (HCC)   Generalized weakness   1. Recent newly diagnosed atrial fibrillation with RVR. CHADSVASC=5. Patient was restarted on Toprol XL and Coumadin. Her rhythm eventually converted to NSR. -Cardiology was consulted. Dr. Domenic Polite increased the Toprol-XL to 100 mg daily. Subsequently, amiodarone was added to better maintain her heart rate and maintain NSR.   Acute on chronic combined systolic and diastolic heart failure/ischemic cardiomyopathy/HTN. -Patient's EF 11/11/16 was 30-35% and grade 2 diastolic dysfunction. Nuclear stress test on 9/11 revealed a large scar consistent with prior  infarction but no ischemic territories. -Patient was treated with torsemide,Imdur, hydralazine and Ranexa chronically. Ranexa was eventually discontinued due to her worsening renal failure. ACE-I/ARB not given due to CKD. -Per cardiology, the dosing of Imdur and hydralazine were decreased in the setting of soft blood pressures on the increased dose of Toprol-XL. -Initially IV fluids were started and torsemide was held due to acute on chronic renal failure. Eventually torsemide was restarted. -Torsemide was discontinued in favor of IV Lasix per cardiology for needed diuresis. -Patient has some swelling in her ankles but this may be a consequence of gravity>> will order to keep her legs elevated and TED hose. -As of today, I/O is -2.18 L. Clinically improving. -We'll continue IV Lasix for another 24 hours before changing to oral.  Recurrent right pleural effusion. The patient underwent a thoracentesis during the 8/27-8/28 hospitalization for large right pleural effusion. 1 L was removed. The fluid was thought to be a transudate from CHF. Cytology was negative for malignancy and revealed reactive mesothelial cells only. -In the ED, CT of her chest revealed recurrent large right pleural effusion. She underwent thoracentesis again on 9/17 yielding 1.7 L of fluid. -Currently stable.  Subclavian artery occlusion. Patient underwent thrombectomy on 07/2015 and again 09/2016. She had been treated with aspirin and BRILINTA , but aspirin was discontinued by cardiology.  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 Patient's baseline creatinine had been around 2.0, but recent hospitalizations revealed a higher baseline of 2.8-2.9. On admission, her creatinine was 3.0. Cardiorenal syndrome was suspected. -Initially torsemide was held and IV fluids were started. With no improvement, torsemide was restarted. Subsequently IV Lasix was ordered. -Her creatinine has progressively improved, approaching baseline.  Nonoliguric. -Continue to  monitor.  Type II, insulin-dependent diabetes mellitus with nephropathy. -Patient is treated chronically with Lantus and Humalog. She was restarted on Lantus and sliding scale NovoLog was added. -She did have one hypoglycemic episode with a CBG falling to 49. -Lantus was decreased.>>> Will decrease it more from 32-30 units. Will discontinue added mealtime coverage of NovoLog. -Her hemoglobin A1c was 8.8.   Hypokalemia. Mild and likely secondary to diuretics. -We'll continue to monitor and supplement.  Anemia of chronic kidney disease. -We'll continue to monitor.   DVT prophylaxis: Warfarin Code Status: Full code Family Communication: Family not available Disposition Plan: Discharged home in clinically appropriate, likely in the next 24-48 hours.   Consultants:   Cardiology  Procedures:   None  Antimicrobials:   Rocephin 9/19  Vancomycin 9/15 >> 9/19  Cefepime 9/15>> 9/19   Subjective: Patient denies chest pain, shortness of breath, or palpitations. She complains of some swelling in her ankles.  Objective: Vitals:   11/25/16 1300 11/25/16 2101 11/26/16 0638 11/26/16 0642  BP: (!) 123/54 127/68 (!) 145/66   Pulse: 78 73 76   Resp: 19 18 18    Temp: 98.3 F (36.8 C) 98.7 F (37.1 C) 98.2 F (36.8 C)   TempSrc: Oral Oral Oral   SpO2: 100% 100% 100%   Weight:    95.2 kg (209 lb 12.6 oz)  Height:        Intake/Output Summary (Last 24 hours) at 11/26/16 1017 Last data filed at 11/26/16 0641  Gross per 24 hour  Intake              360 ml  Output             1500 ml  Net            -1140 ml   Filed Weights   11/24/16 0900 11/25/16 0521 11/26/16 0642  Weight: 98 kg (216 lb) 95.2 kg (209 lb 12.8 oz) 95.2 kg (209 lb 12.6 oz)    Examination:  General exam: Appears calm and comfortable  Respiratory system: Decreased breath sounds on the right, clear on the left, no distinct crackles or wheezes.Marland Kitchen Respiratory effort  normal. Cardiovascular system: S1 & S2 heard, soft systolic murmur.  Trace to 1+ bilateral pedal edema; no significant pretibial edema. Gastrointestinal system: Abdomen is nondistended, soft and nontender. No organomegaly or masses felt. Normal bowel sounds heard. Central nervous system: Alert and oriented. No focal neurological deficits. Extremities: No acute hot, red joints. Skin: No rashes, lesions or ulcers Psychiatry: Judgement and insight appear normal. Mood & affect appropriate.     Data Reviewed: I have personally reviewed following labs and imaging studies  CBC:  Recent Labs Lab 11/21/16 0452  WBC 6.9  HGB 8.6*  HCT 27.1*  MCV 97.1  PLT 741*   Basic Metabolic Panel:  Recent Labs Lab 11/22/16 0934 11/23/16 0536 11/24/16 0609 11/25/16 0615 11/26/16 0718  NA 139 138 135 137 139  K 3.5 3.1* 3.8 3.8 3.4*  CL 102 102 100* 100* 101  CO2 28 28 26 29 28   GLUCOSE 106* 105* 225* 124* 115*  BUN 50* 48* 51* 43* 43*  CREATININE 2.41* 2.24* 2.40* 2.30* 2.17*  CALCIUM 8.5* 8.3* 8.5* 8.5* 8.7*  MG  --  2.0  --   --   --   PHOS  --  2.8  --   --   --    GFR: Estimated Creatinine Clearance: 28.7 mL/min (A) (by C-G formula based on SCr of 2.17 mg/dL (  H)). Liver Function Tests:  Recent Labs Lab 11/21/16 0452 11/22/16 0934  AST 40 29  ALT 40 32  ALKPHOS 153* 144*  BILITOT 0.5 0.3  PROT 6.8 6.8  ALBUMIN 2.6* 2.6*   No results for input(s): LIPASE, AMYLASE in the last 168 hours. No results for input(s): AMMONIA in the last 168 hours. Coagulation Profile:  Recent Labs Lab 11/22/16 0619 11/23/16 0536 11/24/16 0609 11/25/16 0615 11/26/16 0718  INR 3.10 2.38 2.23 2.57 2.99   Cardiac Enzymes:  Recent Labs Lab 11/23/16 0536  TROPONINI <0.03   BNP (last 3 results) No results for input(s): PROBNP in the last 8760 hours. HbA1C: No results for input(s): HGBA1C in the last 72 hours. CBG:  Recent Labs Lab 11/25/16 1618 11/25/16 2100 11/26/16 0538  11/26/16 0644 11/26/16 0741  GLUCAP 174* 110* 49* 105* 116*   Lipid Profile: No results for input(s): CHOL, HDL, LDLCALC, TRIG, CHOLHDL, LDLDIRECT in the last 72 hours. Thyroid Function Tests: No results for input(s): TSH, T4TOTAL, FREET4, T3FREE, THYROIDAB in the last 72 hours. Anemia Panel: No results for input(s): VITAMINB12, FOLATE, FERRITIN, TIBC, IRON, RETICCTPCT in the last 72 hours. Sepsis Labs: No results for input(s): PROCALCITON, LATICACIDVEN in the last 168 hours.  Recent Results (from the past 240 hour(s))  Gram stain     Status: None   Collection Time: 11/21/16 12:15 PM  Result Value Ref Range Status   Specimen Description PLEURAL  Final   Special Requests NONE  Final   Gram Stain NO ORGANISMS SEEN PERFORMED AT [facility] NO WBC SEEN   Final   Report Status 11/23/2016 FINAL  Final  Culture, body fluid-bottle     Status: None   Collection Time: 11/21/16 12:15 PM  Result Value Ref Range Status   Specimen Description PLEURAL  Final   Special Requests BOTTLES DRAWN AEROBIC AND ANAEROBIC 10 CC EACH  Final   Culture NO GROWTH 5 DAYS  Final   Report Status 11/26/2016 FINAL  Final         Radiology Studies: No results found.      Scheduled Meds: . amiodarone  200 mg Oral BID  . bisacodyl  10 mg Rectal Daily  . cyclobenzaprine  5 mg Oral QHS  . fluticasone  1 spray Each Nare Daily  . furosemide  80 mg Intravenous BID  . hydrALAZINE  25 mg Oral Q8H  . insulin aspart  0-15 Units Subcutaneous TID WC  . insulin aspart  0-5 Units Subcutaneous QHS  . insulin aspart  3 Units Subcutaneous TID WC  . insulin glargine  32 Units Subcutaneous QHS  . isosorbide mononitrate  30 mg Oral Daily  . metoprolol succinate  100 mg Oral Q breakfast  . pantoprazole  40 mg Oral Daily  . polyethylene glycol  17 g Oral BID  . rosuvastatin  40 mg Oral QHS  . ticagrelor  60 mg Oral BID  . Warfarin - Pharmacist Dosing Inpatient   Does not apply Q24H   Continuous Infusions:   LOS: 7  days    Time spent: 85 minutes    Rexene Alberts, MD [facility] Pager [phone]  If 7PM-7AM, please contact night-coverage [email or web address] Password [facility] 11/26/2016, 10:17 AM

## 2016-11-27 DIAGNOSIS — Z794 Long term (current) use of insulin: Secondary | ICD-10-CM

## 2016-11-27 DIAGNOSIS — E1122 Type 2 diabetes mellitus with diabetic chronic kidney disease: Secondary | ICD-10-CM

## 2016-11-27 DIAGNOSIS — Z9889 Other specified postprocedural states: Secondary | ICD-10-CM

## 2016-11-27 LAB — CBC
HEMATOCRIT: 29.2 % — AB (ref 36.0–46.0)
HEMOGLOBIN: 8.9 g/dL — AB (ref 12.0–15.0)
MCH: 29.5 pg (ref 26.0–34.0)
MCHC: 30.5 g/dL (ref 30.0–36.0)
MCV: 96.7 fL (ref 78.0–100.0)
Platelets: 413 10*3/uL — ABNORMAL HIGH (ref 150–400)
RBC: 3.02 MIL/uL — ABNORMAL LOW (ref 3.87–5.11)
RDW: 16.7 % — ABNORMAL HIGH (ref 11.5–15.5)
WBC: 4.4 10*3/uL (ref 4.0–10.5)

## 2016-11-27 LAB — BASIC METABOLIC PANEL
ANION GAP: 9 (ref 5–15)
BUN: 40 mg/dL — ABNORMAL HIGH (ref 6–20)
CALCIUM: 8.8 mg/dL — AB (ref 8.9–10.3)
CO2: 29 mmol/L (ref 22–32)
Chloride: 102 mmol/L (ref 101–111)
Creatinine, Ser: 2.04 mg/dL — ABNORMAL HIGH (ref 0.44–1.00)
GFR calc Af Amer: 27 mL/min — ABNORMAL LOW (ref >60)
GFR calc non Af Amer: 23 mL/min — ABNORMAL LOW (ref >60)
GLUCOSE: 55 mg/dL — AB (ref 65–99)
POTASSIUM: 3.6 mmol/L (ref 3.5–5.1)
Sodium: 140 mmol/L (ref 135–145)

## 2016-11-27 LAB — GLUCOSE, CAPILLARY
GLUCOSE-CAPILLARY: 258 mg/dL — AB (ref 65–99)
Glucose-Capillary: 51 mg/dL — ABNORMAL LOW (ref 65–99)
Glucose-Capillary: 119 mg/dL — ABNORMAL HIGH (ref 65–99)
Glucose-Capillary: 299 mg/dL — ABNORMAL HIGH (ref 65–99)
Glucose-Capillary: 420 mg/dL — ABNORMAL HIGH (ref 65–99)

## 2016-11-27 LAB — PROTIME-INR
INR: 2.69
PROTHROMBIN TIME: 28.4 s — AB (ref 11.4–15.2)

## 2016-11-27 MED ORDER — METOPROLOL SUCCINATE ER 100 MG PO TB24: 100.0000 mg | ORAL_TABLET | Freq: Every day | ORAL | 2 refills | Status: DC

## 2016-11-27 MED ORDER — TORSEMIDE 20 MG PO TABS: 80.0000 mg | ORAL_TABLET | Freq: Two times a day (BID) | ORAL | 2 refills | Status: DC

## 2016-11-27 MED ORDER — INSULIN ASPART 100 UNIT/ML ~~LOC~~ SOLN
0.0000 [IU] | Freq: Three times a day (TID) | SUBCUTANEOUS | Status: DC
  Administered 2016-11-27: 20 [IU] via SUBCUTANEOUS

## 2016-11-27 MED ORDER — INSULIN GLARGINE 100 UNIT/ML ~~LOC~~ SOLN
15.0000 [IU] | Freq: Every day | SUBCUTANEOUS | Status: DC
  Filled 2016-11-27: qty 0.15

## 2016-11-27 MED ORDER — FUROSEMIDE 10 MG/ML IJ SOLN: 80.0000 mg | Freq: Two times a day (BID) | INTRAMUSCULAR | Status: DC

## 2016-11-27 MED ORDER — HYDRALAZINE HCL 100 MG PO TABS: 50.0000 mg | ORAL_TABLET | Freq: Two times a day (BID) | ORAL | Status: DC

## 2016-11-27 MED ORDER — WARFARIN SODIUM 2.5 MG PO TABS
2.5000 mg | ORAL_TABLET | Freq: Once | ORAL | Status: AC
  Administered 2016-11-27: 2.5 mg via ORAL
  Filled 2016-11-27: qty 1

## 2016-11-27 MED ORDER — INSULIN ASPART 100 UNIT/ML ~~LOC~~ SOLN: 0.0000 [IU] | Freq: Every day | SUBCUTANEOUS | Status: DC

## 2016-11-27 MED ORDER — INSULIN LISPRO 100 UNIT/ML (KWIKPEN): PEN_INJECTOR | SUBCUTANEOUS | Status: DC

## 2016-11-27 MED ORDER — DEXTROSE 50 % IV SOLN
1.0000 | Freq: Once | INTRAVENOUS | Status: AC
  Administered 2016-11-27: 50 mL via INTRAVENOUS
  Filled 2016-11-27: qty 50

## 2016-11-27 MED ORDER — INSULIN GLARGINE 100 UNIT/ML ~~LOC~~ SOLN: 30.0000 [IU] | Freq: Every day | SUBCUTANEOUS | Status: DC

## 2016-11-27 MED ORDER — SODIUM CHLORIDE 0.9 % IV BOLUS (SEPSIS)
250.0000 mL | Freq: Once | INTRAVENOUS | Status: AC
  Administered 2016-11-27: 250 mL via INTRAVENOUS

## 2016-11-27 MED ORDER — WARFARIN SODIUM 7.5 MG PO TABS: ORAL_TABLET | ORAL | 2 refills | Status: DC

## 2016-11-27 MED ORDER — ISOSORBIDE MONONITRATE ER 60 MG PO TB24: 30.0000 mg | ORAL_TABLET | Freq: Every day | ORAL | Status: DC

## 2016-11-27 MED ORDER — AMIODARONE HCL 200 MG PO TABS: 200.0000 mg | ORAL_TABLET | Freq: Two times a day (BID) | ORAL | 2 refills | Status: DC

## 2016-11-27 NOTE — Progress Notes (Signed)
Patient's fasting blood glucose reading this morning was 51. I have given her a snack. Blood glucose came back up to 119. MD ordered 50 mL of D/5 to be given via IV. Gave patient injection. Patient is stable now.

## 2016-11-27 NOTE — Progress Notes (Signed)
ANTICOAGULATION CONSULT NOTE - follow up  Pharmacy Consult for coumadin Indication: atrial fibrillation  Allergies  Allergen Reactions  . Ace Inhibitors Cough  . Amlodipine     Edema   . Codeine Rash   Patient Measurements: Height: 5\' 8"  (172.7 cm) Weight: 205 lb 11 oz (93.3 kg) IBW/kg (Calculated) : 63.9  Vital Signs: Temp: 98.7 F (37.1 C) (09/23 0546) Temp Source: Oral (09/23 0546) BP: 137/60 (09/23 0546) Pulse Rate: 77 (09/23 0546)  Labs:  Recent Labs  11/25/16 0615 11/26/16 0718 11/27/16 0633  HGB  --   --  8.9*  HCT  --   --  29.2*  PLT  --   --  413*  LABPROT 27.4* 30.8* 28.4*  INR 2.57 2.99 2.69  CREATININE 2.30* 2.17* 2.04*   Estimated Creatinine Clearance: 30.2 mL/min (A) (by C-G formula based on SCr of 2.04 mg/dL (H)).  Medical History: Past Medical History:  Diagnosis Date  . Acute renal failure superimposed on stage 4 chronic kidney disease ([place]) 07/04/2015  . Allergic rhinitis   . Anemia   . Anxiety   . CAD in native artery    NSTEMI 07/2015 s/p DES to RCA and posterior PDA, PCI 10/2015 with scoring balloon to 85% ISR of distal RCA), known LAD/Cx disease treated medically  . Carotid artery disease ([place])    Mild bilateral carotid disease (1-39% 07/2015)  . Chronic combined systolic and diastolic CHF (congestive heart failure) ([place])   . CKD (chronic kidney disease), stage IV ([place])   . Constipation   . Essential hypertension   . GERD (gastroesophageal reflux disease)   . History of hysterectomy   . Hyperlipidemia   . Low back pain   . Obesity   . 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 Osteoarthritis   . Overactive bladder   . PVC's (premature ventricular contractions)   . Type 2 diabetes mellitus (HCC)    Medications:  Prescriptions Prior to Admission  Medication Sig Dispense Refill Last Dose  . acetaminophen (TYLENOL) 500 MG tablet Take 2 tablets (1,000 mg total)  by mouth every 6 (six) hours as needed (pain). 30 tablet 0 11/18/2016 at Unknown time  . albuterol (PROVENTIL HFA;VENTOLIN HFA) 108 (90 Base) MCG/ACT inhaler Inhale 1-2 puffs into the lungs every 6 (six) hours as needed for wheezing or shortness of breath. 1 Inhaler 0 11/19/2016 at Unknown time  . albuterol (PROVENTIL) (2.5 MG/3ML) 0.083% nebulizer solution Take 3 mLs (2.5 mg total) by nebulization every 6 (six) hours as needed for wheezing or shortness of breath. 75 mL 1 11/19/2016 at Unknown time  . ALPRAZolam (XANAX) 0.5 MG tablet Take 0.5 mg by mouth at bedtime.     . [EXPIRED] cefUROXime (CEFTIN) 500 MG tablet Take 1 tablet (500 mg total) by mouth daily with breakfast. 5 tablet 0 11/18/2016 at Unknown time  . cyclobenzaprine (FLEXERIL) 5 MG tablet Take 5 mg by mouth at bedtime.  0 Past Week at Unknown time  . diazepam (VALIUM) 5 MG tablet Take 1 tablet (5 mg total) by mouth every 12 (twelve) hours as needed for muscle spasms. 10 tablet 0 Past Week at Unknown time  . ferrous sulfate 325 (65 FE) MG tablet Take 325 mg by mouth daily with breakfast.    11/18/2016 at Unknown time  . fluticasone (FLONASE) 50 MCG/ACT nasal spray Place 1 spray into both nostrils daily.   11/18/2016 at Unknown time  . hydrALAZINE (  APRESOLINE) 100 MG tablet Take 1 tablet (100 mg total) by mouth 3 (three) times daily. 120 tablet 1 11/18/2016 at Unknown time  . insulin glargine (LANTUS) 100 UNIT/ML injection Inject 40 Units into the skin at bedtime.   11/18/2016 at Unknown time  . insulin lispro (HUMALOG KWIKPEN) 100 UNIT/ML KiwkPen INJECT 18 TO 24 UNITS TOTAL INTO THE SKIN THREE TIMES DAILY 15 mL 2 11/18/2016 at Unknown time  . isosorbide mononitrate (IMDUR) 60 MG 24 hr tablet Take 1 tablet (60 mg total) by mouth daily. 30 tablet 0 11/18/2016 at Unknown time  . latanoprost (XALATAN) 0.005 % ophthalmic solution Place 1 drop into both eyes at bedtime.    11/18/2016 at Unknown time  . meclizine (ANTIVERT) 25 MG tablet Take 12.5 mg by  mouth 2 (two) times daily.    11/18/2016 at Unknown time  . metoprolol succinate (TOPROL-XL) 25 MG 24 hr tablet Take 3 tablets (75 mg total) by mouth daily. Take with or immediately following a meal. 30 tablet 2 11/18/2016 at 2200  . Multiple Vitamin (MULTIVITAMIN WITH MINERALS) TABS tablet Take 1 tablet by mouth daily.   Past Week at Unknown time  . nitroGLYCERIN (NITROSTAT) 0.4 MG SL tablet Place 1 tablet (0.4 mg total) under the tongue every 5 (five) minutes as needed for chest pain. 25 tablet 2 unknown at unknown  . pantoprazole (PROTONIX) 40 MG tablet Take 40 mg by mouth daily.    11/18/2016 at Unknown time  . potassium chloride SA (K-DUR,KLOR-CON) 20 MEQ tablet Take 2 tablets (40 mEq total) by mouth daily. 60 tablet 5 11/18/2016 at Unknown time  . RANEXA 500 MG 12 hr tablet TAKE ONE TABLET BY MOUTH TWICE DAILY 60 tablet 5 11/18/2016 at Unknown time  . rosuvastatin (CRESTOR) 40 MG tablet Take 1 tablet (40 mg total) by mouth at bedtime. 30 tablet 0 11/18/2016 at Unknown time  . ticagrelor (BRILINTA) 60 MG TABS tablet Take 1 tablet (60 mg total) by mouth 2 (two) times daily. 60 tablet 1 11/18/2016 at 2200  . torsemide (DEMADEX) 20 MG tablet Take 60 mg by mouth 2 (two) times daily. Pt is able to take an additional tablet daily for weight gain greater than 3lbs.   11/18/2016 at Unknown time  . trolamine salicylate (ASPERCREME) 10 % cream Apply 1 application topically as needed for muscle pain.   Past Week at Unknown time  . warfarin (COUMADIN) 7.5 MG tablet Take 1 tablet (7.5 mg total) by mouth once. 45 tablet 2    Assessment: 71 yo female with significant cardiac history including CHF, CAD s/p PCI 2018; CKD stage 4; now with new onset Afib (nonvalvular).  Pt was initially started on rivaroxaban but due to renal fxn was switched to WARFARIN on 9/10. INR at goal today. Likely she will need lower dose than previously due to addition of amiodarone.  Goal of Therapy:  INR 2-3 Monitor platelets by  anticoagulation protocol: Yes   Plan:  Coumadin 2.5 mg po today PT-INR daily Monitor for S/S of bleeding  Jceon Alverio Poteet, RPH  11/27/2016,8:57 AM

## 2016-11-27 NOTE — Discharge Summary (Signed)
Physician Discharge Summary  [name] XLK:[id number] DOB: 04-23-1945 DOA: 11/19/2016  PCP: [name], MD  Admit date: 11/19/2016 Discharge date: 11/27/2016  Time spent: > 30 minutes  Recommendations for Outpatient Follow-up:  1. Medication changes noted :  Demadex increased to 80 mg twice a day; hydralazine decreased to 50 mg twice a day; Toprol-XL increased to 100 mg daily, Imdur decreased to 30 mg daily and warfarin decreased to 7.5mg  alternating with 3.75 mg daily.  2. Recommend close follow-up of her INR, CBC and renal function.    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   Generalized weakness   Discharge Condition: Improved.  Diet recommendation: Heart healthy/carbohydrate modified.  Filed Weights   11/25/16 0521 11/26/16 0642 11/27/16 0500  Weight: 95.2 kg (209 lb 12.8 oz) 95.2 kg (209 lb 12.6 oz) 93.3 kg (205 lb 11 oz)    History of present illness:  71 year old female with history of coronary artery disease with stenting, chronic combined systolic and diastolic CHF, right subclavian artery occlusion with thrombectomy in 07/2015 and in 09/2016, CKD stage III-IV, NSTEMI, uncontrolled type 2 diabetes mellitus, and right pleural effusion who was hospitalized from 11/10/16-11/16/16 with new onset A. fib, HCAP, and worsened heart failure which caused a right pleural effusion-status post thoracentesis. She was discharged home on anticoagulation on 11/16/16 after her symptoms improved however, she returned on 9/15 with with a complaint SOB and found to be in  A. fib with RVR, worsened renal function and worsened right pleural effusion. She was admitted for further evaluation and management.  Hospital Course:  1. Recent newly diagnosed atrial fibrillation with RVR. CHADSVASC=5. Patient was restarted on Toprol XL and Coumadin. Her rhythm eventually converted to NSR. -Cardiology was consulted. Dr. Domenic Polite increased the Toprol-XL to 100 mg daily. Subsequently, amiodarone was added to better maintain her heart rate and maintain NSR. -Her heart rate was controlled and she was in NSR at the time of discharge. -Due to the addition of amiodarone, her INR increased to above 3. Following my discussion with the pharmacist, her home dose of warfarin was decreased from 7.5 mg daily to 7.5 mg alternating with 3.75 mg daily  Acute on chronic combined systolic and diastolic heart failure/ischemic cardiomyopathy/HTN. -Patient's EF 11/11/16 was 30-35% and with grade 2 diastolic dysfunction. Nuclear stress test on 9/11 revealed a large scar consistent with prior infarction but no ischemic territories. -Prior to the hospitalization, the patient was treated with Toprol-XL, torsemide, Imdur, hydralazine and Ranexa chronically. Ranexa was eventually discontinued due to her worsening renal failure. ACE-I/ARB not given due to CKD. -Per cardiology, the dosing of Imdur and hydralazine were decreased in the setting of soft blood pressures with the increased dose of Toprol-XL. -Initially IV fluids were started and torsemide was held due to acute on chronic renal failure. Eventually torsemide was restarted when her renal function did not improve with hydration. -Torsemide was discontinued in favor of IV Lasix per cardiology for needed diuresis. -Her I/O was -5.14 L at the time of discharge. -Her home dose of torsemide was increased from 60 mg to  80 mg twice a day at the time of discharge.  Recurrent right pleural effusion. The patient underwent a thoracentesis during the 8/27-8/28  hospitalization for large right pleural effusion. 1 L was removed. The fluid was thought to be a transudate from CHF. Cytology was negative for malignancy and revealed reactive mesothelial cells only. -In the ED on this admission, CT of her chest revealed recurrent large right pleural effusion. She underwent thoracentesis again on 9/17 yielding 1.7 L of fluid. -Antibiotics vancomycin and cefepime were given for for a potential infection, but were discontinued when there was no clinical evidence of pneumonia. -She improved symptomatically.  Subclavian artery occlusion. Patient underwent thrombectomy on 07/2015 and again 09/2016. She had been treated with aspirin and BRILINTA , but aspirin was discontinued by cardiology.  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 Patient's baseline creatinine had been around 2.0, but recent hospitalizations revealed a higher baseline of 2.8-2.9. On admission, her creatinine was 3.0. Cardiorenal syndrome was suspected. -Initially torsemide was held and IV fluids were started. With no improvement, torsemide was restarted. Subsequently IV Lasix was ordered. -Her creatinine improved progressively following diuresis and it was at baseline of 2.0 at the time of discharge.  Type II, insulin-dependent diabetes mellitus with nephropathy. -Patient is treated chronically with Lantus and Humalog. She was restarted on Lantus and sliding scale NovoLog was added. -She had a couple of hypoglycemic episode with a CBG falling to 49 and 55. -The dose of Lantus was decreased and mealtime coverage was discontinued. -She was given an amp of D50 on the day of discharge which increased her CBG to greater than 400. For treatment, she was given insulin and a 250 cc bolus of normal saline. -Overall, her blood glucose was somewhat labile. -Due to hope her propensity for hypoglycemia, her home dose of Lantus and sliding scale Humalog was decreased. She was encouraged to be diligent  about checking her CBGs at home. She was instructed to follow-up closely with her endocrinologist, Dr. Dorris Fetch. -Her hemoglobin A1c was 8.8.  Hypokalemia. Mild and likely secondary to diuretics. She was given potassium chloride supplements with improvement of her serum potassium.  Anemia of chronic kidney disease. -Patient's baseline hemoglobin ranges from 8.1-9.5. It was at baseline during the hospitalization. -She was maintained on a PPI and iron supplement.    Procedures:  None  Consultations:  Cardiology  Discharge Exam: Vitals:   11/27/16 0759 11/27/16 1343  BP:  133/63  Pulse:  70  Resp:  16  Temp:  98.7 F (37.1 C)  SpO2: 96% 100%    General exam: Appears calm and comfortable  Respiratory system: Decreased breath sounds on the right, clear on the left, no distinct crackles or wheezes.Marland Kitchen Respiratory effort normal. Cardiovascular system: S1 & S2 heard, soft systolic murmur.  Trace to 1+ bilateral pedal edema; no significant pretibial edema. Gastrointestinal system: Abdomen is nondistended, soft and nontender. No organomegaly or masses felt. Normal bowel sounds heard.   Discharge Instructions   Discharge Instructions    AMB Referral to [facility]    Complete by:  As directed    Reason for consult:  post hospital discharge follow up with  RN community care manager   Diagnoses of:   Heart Failure Diabetes Kidney Failure     Expected date of contact:  1-3 days (reserved for hospital discharges)   Please assign patient for community nurse to engage for transition of care calls and evaluate for monthly home visits. For questions please  contact:   Janci Minor RN, [facility] Liaison [phone])   Diet - low sodium heart healthy    Complete by:  As directed    Diet Carb Modified    Complete by:  As directed    Discharge instructions    Complete by:  As directed    -There has been several changes in your medications. Go over them carefully with  her family. -Check your blood sugars at least twice daily and as needed. -Weight yourself daily. -Try to follow-up with your heart doctor and your primary care doctor within the next 1-2 weeks.   Increase activity slowly    Complete by:  As directed      Current Discharge Medication List    START taking these medications   Details  amiodarone (PACERONE) 200 MG tablet Take 1 tablet (200 mg total) by mouth 2 (two) times daily. New medication to control your heart rate. Qty: 60 tablet, Refills: 2      CONTINUE these medications which have CHANGED   Details  hydrALAZINE (APRESOLINE) 100 MG tablet Take 0.5 tablets (50 mg total) by mouth 2 (two) times daily.    insulin glargine (LANTUS) 100 UNIT/ML injection Inject 0.3 mLs (30 Units total) into the skin at bedtime.    insulin lispro (HUMALOG KWIKPEN) 100 UNIT/ML KiwkPen INJECT 5 TO 20 TOTAL INTO THE SKIN THREE TIMES DAILY    isosorbide mononitrate (IMDUR) 60 MG 24 hr tablet Take 0.5 tablets (30 mg total) by mouth daily.    metoprolol succinate (TOPROL-XL) 100 MG 24 hr tablet Take 1 tablet (100 mg total) by mouth daily with breakfast. Take with or immediately following a meal. Qty: 30 tablet, Refills: 2    torsemide (DEMADEX) 20 MG tablet Take 4 tablets (80 mg total) by mouth 2 (two) times daily. Pt is able to take an additional tablet daily for weight gain greater than 3lbs. Qty: 240 tablet, Refills: 2    warfarin (COUMADIN) 7.5 MG tablet Your dosing has been changed to one tablet alternating with a half a tablet daily. Starting on the evening of Monday 11/28/16, take 1 tablet, then on Tuesday 9/25 take a half a tablet, then follow the pattern of one tablet alternating with a half a tablet daily. Qty: 30 tablet, Refills: 2      CONTINUE these medications which have NOT CHANGED   Details  acetaminophen (TYLENOL) 500 MG tablet Take 2 tablets (1,000 mg total) by mouth every 6 (six) hours as needed (pain). Qty: 30 tablet, Refills: 0     albuterol (PROVENTIL HFA;VENTOLIN HFA) 108 (90 Base) MCG/ACT inhaler Inhale 1-2 puffs into the lungs every 6 (six) hours as needed for wheezing or shortness of breath. Qty: 1 Inhaler, Refills: 0    albuterol (PROVENTIL) (2.5 MG/3ML) 0.083% nebulizer solution Take 3 mLs (2.5 mg total) by nebulization every 6 (six) hours as needed for wheezing or shortness of breath. Qty: 75 mL, Refills: 1    ALPRAZolam (XANAX) 0.5 MG tablet Take 0.5 mg by mouth at bedtime.    cyclobenzaprine (FLEXERIL) 5 MG tablet Take 5 mg by mouth at bedtime. Refills: 0    diazepam (VALIUM) 5 MG tablet Take 1 tablet (5 mg total) by mouth every 12 (twelve) hours as needed for muscle spasms. Qty: 10 tablet, Refills: 0    ferrous sulfate 325 (65 FE) MG tablet Take 325 mg by mouth daily with breakfast.     fluticasone (FLONASE) 50 MCG/ACT nasal spray Place 1 spray  into both nostrils daily.    latanoprost (XALATAN) 0.005 % ophthalmic solution Place 1 drop into both eyes at bedtime.     meclizine (ANTIVERT) 25 MG tablet Take 12.5 mg by mouth 2 (two) times daily.     Multiple Vitamin (MULTIVITAMIN WITH MINERALS) TABS tablet Take 1 tablet by mouth daily.    nitroGLYCERIN (NITROSTAT) 0.4 MG SL tablet Place 1 tablet (0.4 mg total) under the tongue every 5 (five) minutes as needed for chest pain. Qty: 25 tablet, Refills: 2    pantoprazole (PROTONIX) 40 MG tablet Take 40 mg by mouth daily.     potassium chloride SA (K-DUR,KLOR-CON) 20 MEQ tablet Take 2 tablets (40 mEq total) by mouth daily. Qty: 60 tablet, Refills: 5    rosuvastatin (CRESTOR) 40 MG tablet Take 1 tablet (40 mg total) by mouth at bedtime. Qty: 30 tablet, Refills: 0    ticagrelor (BRILINTA) 60 MG TABS tablet Take 1 tablet (60 mg total) by mouth 2 (two) times daily. Qty: 60 tablet, Refills: 1    trolamine salicylate (ASPERCREME) 10 % cream Apply 1 application topically as needed for muscle pain.      STOP taking these medications     cefUROXime (CEFTIN)  500 MG tablet      RANEXA 500 MG 12 hr tablet        Allergies  Allergen Reactions  . Ace Inhibitors Cough  . Amlodipine     Edema   . Codeine Rash   Follow-up Information    [facility]   Contact information: [address] [place] [address] [phone]        Herminio Commons, MD. Schedule an appointment as soon as possible for a visit in 1 week(s).   Specialty:  Cardiology Why:  1-2 WEEKS Contact information: [place] [address] [phone]        [name], MD. Schedule an appointment as soon as possible for a visit.   Specialty:  Family Medicine Why:  1-2 WEEKS Contact information: [address] [place] [address] [phone]            The results of significant diagnostics from this hospitalization (including imaging, microbiology, ancillary and laboratory) are listed below for reference.    Significant Diagnostic Studies: Dg Chest 1 View  Result Date: 11/21/2016 CLINICAL DATA:  Pleural effusions, right greater than left. EXAM: CHEST 1 VIEW COMPARISON:  11/20/2016 FINDINGS: Right pleural effusion has been eliminated. There is a moderate left effusion which appears slightly more prominent than on the prior study. Overall heart size and pulmonary vascularity are normal. No pneumothorax after thoracentesis. IMPRESSION: No pneumothorax after right thoracentesis. No visible residual right pleural fluid. Moderate left effusion appears slightly increased. Electronically Signed   By: Lorriane Shire M.D.   On: 11/21/2016 13:08   Dg Chest 1 View  Result Date: 10/31/2016 CLINICAL DATA:  Status post right thoracentesis. EXAM: CHEST 1 VIEW COMPARISON:  10/31/2016. FINDINGS: Stable enlarged cardiac silhouette. Minimal left lower lobe atelectasis with improvement. Minimal bilateral pleural fluid, mildly improved on the left and significantly improved on the right following thoracentesis. No pneumothorax.  Thoracic spine degenerative changes. IMPRESSION: 1. Almost completely resolved right pleural fluid following thoracentesis without pneumothorax. 2. Small left pleural effusion with improvement with decreased left basilar atelectasis. Electronically Signed   By: Claudie Revering M.D.   On: 10/31/2016 13:30   Dg Chest 2 View  Result Date: 11/19/2016 CLINICAL DATA:  Short of breath. Discharged 3 days ago with congestive heart failure. EXAM: CHEST  2 VIEW COMPARISON:  11/10/2016 FINDINGS: Degraded lateral view secondary to positioning and overlying artifact. Midline trachea. Normal heart size for level of inspiration. Increase in moderate right pleural effusion. No pneumothorax. Low lung volumes. Mild pulmonary venous congestion. Worsened right and developing mild left base airspace disease. IMPRESSION: Increase in moderate right pleural effusion with adjacent atelectasis or infection. Mild pulmonary venous congestion with minimal left base atelectasis. Electronically Signed   By: Abigail Miyamoto M.D.   On: 11/19/2016 09:08   Dg Chest 2 View  Result Date: 10/31/2016 CLINICAL DATA:  Chest pain and shortness of breath.  Cough. EXAM: CHEST  2 VIEW COMPARISON:  08/07/2016 FINDINGS: There is cardiomegaly. Bilateral pleural effusions, moderate on the right and small on the left. There is vascular congestion and peribronchial cuffing suggesting pulmonary edema. No pneumothorax. IMPRESSION: Cardiomegaly with bilateral pleural effusions and vascular congestion. Peribronchial cuffing may reflect pulmonary edema or bronchial inflammation. Electronically Signed   By: Jeb Levering M.D.   On: 10/31/2016 06:11   Ct Soft Tissue Neck Wo Contrast  Result Date: 10/29/2016 CLINICAL DATA:  Left-sided neck pain after doing exercises in cardiac rehab last week. EXAM: CT NECK WITHOUT CONTRAST TECHNIQUE: Multidetector CT imaging of the neck was performed following the standard protocol without intravenous contrast. COMPARISON:  None.  FINDINGS: Pharynx and larynx: No noted mass or inflammation. Limited at the level of the larynx due to patient motion. Salivary glands: No inflammation, mass, or stone. Thyroid: Normal. Lymph nodes: No enlargement or abnormal density of cervical lymph nodes. Vascular: Limited without contrast. Moderate calcified plaque at the carotid bifurcations. Limited intracranial: Negative Visualized orbits: Partly seen. No acute finding. Right cataract resection. Mastoids and visualized paranasal sinuses: Lobulated soft tissue in the left maxillary could be polyp or lobulated retention cyst. No destructive changes in the neighboring bone. Skeleton: Negative for acute or destructive finding. There are calcified disc bulges from C2-3 to C6-7, with ligamentum flavum thickening and calcification at C4-5. These cause cord deformity at C3-4 and C4-5. Patient has undergone laminectomy at C5-6. Upper chest: Left peritracheal soft tissue is likely multiple borderline enlarged lymph nodes rather than 1 large node or nodal conglomerate. These could be congestive given layering pleural effusions, small where seen. IMPRESSION: 1. Limited by patient motion and lack of IV contrast. No specific explanation for acute neck pain. 2. Advanced cervical spine degeneration with calcified disc bulges and ligamentum flavum thickening. 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 3. Layering pleural effusions, small where seen. 4. Polypoid soft tissue in the left maxillary sinus, nonspecific without contrast. Electronically Signed   By: Monte Fantasia M.D.   On: 10/29/2016 18:39   Ct Chest Wo Contrast  Result Date: 11/19/2016 CLINICAL DATA:  Shortness of breath. EXAM: CT CHEST WITHOUT CONTRAST TECHNIQUE: Multidetector CT imaging of the chest was performed following the standard protocol without IV contrast. COMPARISON:  Chest x-ray from same day. CT chest dated October 31, 2016. FINDINGS:  Cardiovascular: Mild cardiomegaly, unchanged. New small pericardial effusion. Coronary, aortic arch, and branch vessel atherosclerotic vascular disease. Mediastinum/Nodes: Prominent and mildly enlarged mediastinal lymph nodes are stable to slightly decreased in size. No axillary lymphadenopathy. The thyroid gland, trachea, and esophagus are unremarkable. Lungs/Pleura: Large right and small left pleural effusions. Right greater than left bibasilar atelectasis. Additional atelectasis in the right middle lobe. No suspicious pulmonary nodules. No consolidation or pneumothorax.  Upper Abdomen: No acute abnormality. Musculoskeletal: No chest wall mass or suspicious bone lesions identified. No fracture. Unchanged degenerative changes of the thoracic spine, worst at T7-T8 and T10-T11. IMPRESSION: 1. Large right and small left pleural effusions with adjacent right greater than left atelectasis. 2. New small pericardial effusion. 3. Stable to slightly decreased in size mildly enlarged mediastinal lymph nodes, likely reactive. 4.  Aortic atherosclerosis (ICD10-I70.0). Electronically Signed   By: Titus Dubin M.D.   On: 11/19/2016 11:16   Ct Chest Wo Contrast  Result Date: 10/31/2016 CLINICAL DATA:  Shortness of breath with exertion EXAM: CT CHEST WITHOUT CONTRAST TECHNIQUE: Multidetector CT imaging of the chest was performed following the standard protocol without IV contrast. COMPARISON:  Chest x-ray 10/31/2016 FINDINGS: Cardiovascular: Aortic and coronary artery calcifications. No aneurysm. Mild cardiomegaly. Mediastinum/Nodes: Scattered borderline and mildly enlarged mediastinal lymph nodes. Index AP window lymph node has a short axis diameter of 18 mm. Index right paratracheal node has a short axis diameter of 9 mm. No axillary adenopathy. Lungs/Pleura: Large right pleural effusion and small left pleural effusion. Bibasilar atelectasis. Mild vascular congestion. Upper Abdomen: Imaging into the upper abdomen shows no  acute findings. Musculoskeletal: Chest wall soft tissues are unremarkable. No acute bony abnormality. IMPRESSION: Large right pleural effusion and small left effusion. Bibasilar atelectasis. Vascular congestion. Extensive coronary artery calcifications. Mildly enlarged mediastinal lymph nodes, likely reactive. Aortic Atherosclerosis (ICD10-I70.0). Electronically Signed   By: Rolm Baptise M.D.   On: 10/31/2016 07:08   Dg Chest Right Decubitus  Result Date: 11/19/2016 CLINICAL DATA:  Right pleural effusion. EXAM: CHEST - RIGHT DECUBITUS COMPARISON:  Chest x-ray from earlier today and CT of the chest November 19, 2016 FINDINGS: The right-side-down decubitus film demonstrates a layering right-sided effusion which correlates with comparison imaging. IMPRESSION: The right-sided pleural effusion layers on this study. Electronically Signed   By: Dorise Bullion III M.D   On: 11/19/2016 15:29   Nm Myocar Multi W/spect Tamela Oddi Motion / Ef  Result Date: 11/15/2016  There was no ST segment deviation noted during stress. T wave inversions inferiorly.  Defect 1: There is a large defect of severe severity present in the basal inferolateral, basal anterolateral, mid anterior, mid anteroseptal, mid inferolateral, mid anterolateral, apical anterior, apical inferior, apical lateral and apex location.  This is a high risk study.  Findings consistent with prior myocardial infarction. No ischemic territories.  Nuclear stress EF: 21%.    Dg Chest Port 1 View  Result Date: 11/20/2016 CLINICAL DATA:  Shortness of breath EXAM: PORTABLE CHEST 1 VIEW COMPARISON:  11/19/2016 FINDINGS: Large right and small to moderate left pleural effusions causing hazy opacities obscuring the lung bases. Mild enlargement of the cardiopericardial silhouette. No findings of acute pulmonary edema. The right pleural effusion has a greater layering effect than on 11/19/2016, possibly due to lack of complete settling prior to imaging. IMPRESSION: 1.  Similar appearance of large right and small to moderate left pleural effusions with passive atelectasis. 2. Mild enlargement of the cardiopericardial silhouette. Electronically Signed   By: Van Clines M.D.   On: 11/20/2016 12:09   Dg Chest Port 1 View  Result Date: 11/10/2016 CLINICAL DATA:  Shortness of breath tonight EXAM: PORTABLE CHEST 1 VIEW COMPARISON:  October 31, 2016 FINDINGS: The mediastinal contour is normal. The heart size is mildly enlarged. There is a small right pleural effusion with patchy consolidation of right lung base. The left lung is clear. No acute abnormalities identified in the visualized bones. IMPRESSION: Small right  pleural effusion with patchy consolidation of right lung base, underline pneumonia is not excluded. Electronically Signed   By: Abelardo Diesel M.D.   On: 11/10/2016 21:28   US Thoracentesis Asp Pleural Space W/img Guide  Result Date: 11/21/2016 INDICATION: Right pleural effusion. EXAM: ULTRASOUND GUIDED RIGHT THORACENTESIS MEDICATIONS: None. COMPLICATIONS: None immediate. PROCEDURE: An ultrasound guided thoracentesis was thoroughly discussed with the patient and questions answered. The benefits, risks, alternatives and complications were also discussed. The patient understands and wishes to proceed with the procedure. Written consent was obtained. Ultrasound was performed to localize and mark an adequate pocket of fluid in the RIGHT chest. The area was then prepped and draped in the normal sterile fashion. 1% Lidocaine was used for local anesthesia. Under ultrasound guidance a Yueh catheter was introduced. Thoracentesis was performed. The catheter was removed and a dressing applied. FINDINGS: A total of approximately 1.7 L of dark blood-tinged fluid was removed. Samples were sent to the laboratory as requested by the clinical team. IMPRESSION: Successful ultrasound guided right thoracentesis yielding 1.7 L of pleural fluid. Electronically Signed   By: Lorriane Shire M.D.   On: 11/21/2016 13:09   US Thoracentesis Asp Pleural Space W/img Guide  Result Date: 10/31/2016 INDICATION: Right pleural effusion. EXAM: ULTRASOUND GUIDED RIGHT THORACENTESIS MEDICATIONS: None. COMPLICATIONS: None immediate. PROCEDURE: An ultrasound guided thoracentesis was thoroughly discussed with the patient and questions answered. The benefits, risks, alternatives and complications were also discussed. The patient understands and wishes to proceed with the procedure. Written consent was obtained. Ultrasound was performed to localize and mark an adequate pocket of fluid in the right chest. The area was then prepped and draped in the normal sterile fashion. 1% Lidocaine was used for local anesthesia. Under ultrasound guidance a Yuen catheter was introduced. Thoracentesis was performed. The catheter was removed and a dressing applied. FINDINGS: A total of approximately 1 L of blood-tinged fluid was removed. Samples were sent to the laboratory as requested by the clinical team. IMPRESSION: Successful ultrasound guided right thoracentesis yielding 1 L of pleural fluid. The patient tolerated the procedure well. No immediate complications. Electronically Signed   By: Lorriane Shire M.D.   On: 10/31/2016 13:09    Microbiology: Recent Results (from the past 240 hour(s))  Gram stain     Status: None   Collection Time: 11/21/16 12:15 PM  Result Value Ref Range Status   Specimen Description PLEURAL  Final   Special Requests NONE  Final   Gram Stain NO ORGANISMS SEEN PERFORMED AT [facility] NO WBC SEEN   Final   Report Status 11/23/2016 FINAL  Final  Culture, body fluid-bottle     Status: None   Collection Time: 11/21/16 12:15 PM  Result Value Ref Range Status   Specimen Description PLEURAL  Final   Special Requests BOTTLES DRAWN AEROBIC AND ANAEROBIC 10 CC EACH  Final   Culture NO GROWTH 5 DAYS  Final   Report Status 11/26/2016 FINAL  Final     Labs: Basic Metabolic Panel:  Recent  Labs Lab 11/23/16 0536 11/24/16 0609 11/25/16 0615 11/26/16 0718 11/27/16 0633  NA 138 135 137 139 140  K 3.1* 3.8 3.8 3.4* 3.6  CL 102 100* 100* 101 102  CO2 28 26 29 28 29   GLUCOSE 105* 225* 124* 115* 55*  BUN 48* 51* 43* 43* 40*  CREATININE 2.24* 2.40* 2.30* 2.17* 2.04*  CALCIUM 8.3* 8.5* 8.5* 8.7* 8.8*  MG 2.0  --   --   --   --  PHOS 2.8  --   --   --   --    Liver Function Tests:  Recent Labs Lab 11/21/16 0452 11/22/16 0934  AST 40 29  ALT 40 32  ALKPHOS 153* 144*  BILITOT 0.5 0.3  PROT 6.8 6.8  ALBUMIN 2.6* 2.6*   No results for input(s): LIPASE, AMYLASE in the last 168 hours. No results for input(s): AMMONIA in the last 168 hours. CBC:  Recent Labs Lab 11/21/16 0452 11/27/16 0633  WBC 6.9 4.4  HGB 8.6* 8.9*  HCT 27.1* 29.2*  MCV 97.1 96.7  PLT 412* 413*   Cardiac Enzymes:  Recent Labs Lab 11/23/16 0536  TROPONINI <0.03   BNP: BNP (last 3 results)  Recent Labs  07/14/16 0632 08/07/16 0328 10/31/16 0528  BNP 345.0* 356.0* 648.0*    ProBNP (last 3 results) No results for input(s): PROBNP in the last 8760 hours.  CBG:  Recent Labs Lab 11/26/16 2155 11/27/16 0734 11/27/16 0853 11/27/16 1145 11/27/16 1641  GLUCAP 108* 51* 119* 299* 420*       Signed:  Kyjuan Gause MD.  [facility] 11/27/2016, 6:20 PM

## 2016-11-28 ENCOUNTER — Ambulatory Visit (INDEPENDENT_AMBULATORY_CARE_PROVIDER_SITE_OTHER): Payer: BLUE CROSS/BLUE SHIELD | Admitting: Otolaryngology

## 2016-11-29 ENCOUNTER — Telehealth: Payer: Self-pay | Admitting: *Deleted

## 2016-11-29 DIAGNOSIS — J189 Pneumonia, unspecified organism: Secondary | ICD-10-CM | POA: Diagnosis not present

## 2016-11-29 DIAGNOSIS — N184 Chronic kidney disease, stage 4 (severe): Secondary | ICD-10-CM | POA: Diagnosis present

## 2016-11-29 DIAGNOSIS — Z880 Allergy status to penicillin: Secondary | ICD-10-CM | POA: Diagnosis not present

## 2016-11-29 DIAGNOSIS — I2510 Atherosclerotic heart disease of native coronary artery without angina pectoris: Secondary | ICD-10-CM | POA: Diagnosis present

## 2016-11-29 DIAGNOSIS — E7800 Pure hypercholesterolemia, unspecified: Secondary | ICD-10-CM | POA: Diagnosis present

## 2016-11-29 DIAGNOSIS — I214 Non-ST elevation (NSTEMI) myocardial infarction: Secondary | ICD-10-CM | POA: Diagnosis present

## 2016-11-29 DIAGNOSIS — Z886 Allergy status to analgesic agent status: Secondary | ICD-10-CM | POA: Diagnosis not present

## 2016-11-29 DIAGNOSIS — Z794 Long term (current) use of insulin: Secondary | ICD-10-CM | POA: Diagnosis not present

## 2016-11-29 DIAGNOSIS — E1122 Type 2 diabetes mellitus with diabetic chronic kidney disease: Secondary | ICD-10-CM | POA: Diagnosis present

## 2016-11-29 DIAGNOSIS — I272 Pulmonary hypertension, unspecified: Secondary | ICD-10-CM | POA: Diagnosis present

## 2016-11-29 DIAGNOSIS — Z888 Allergy status to other drugs, medicaments and biological substances status: Secondary | ICD-10-CM | POA: Diagnosis not present

## 2016-11-29 DIAGNOSIS — K219 Gastro-esophageal reflux disease without esophagitis: Secondary | ICD-10-CM | POA: Diagnosis present

## 2016-11-29 DIAGNOSIS — J9602 Acute respiratory failure with hypercapnia: Secondary | ICD-10-CM | POA: Diagnosis present

## 2016-11-29 DIAGNOSIS — I5043 Acute on chronic combined systolic (congestive) and diastolic (congestive) heart failure: Secondary | ICD-10-CM | POA: Diagnosis present

## 2016-11-29 DIAGNOSIS — I255 Ischemic cardiomyopathy: Secondary | ICD-10-CM | POA: Diagnosis present

## 2016-11-29 DIAGNOSIS — I509 Heart failure, unspecified: Secondary | ICD-10-CM | POA: Diagnosis not present

## 2016-11-29 DIAGNOSIS — I129 Hypertensive chronic kidney disease with stage 1 through stage 4 chronic kidney disease, or unspecified chronic kidney disease: Secondary | ICD-10-CM | POA: Diagnosis present

## 2016-11-29 DIAGNOSIS — E1121 Type 2 diabetes mellitus with diabetic nephropathy: Secondary | ICD-10-CM | POA: Diagnosis present

## 2016-11-29 DIAGNOSIS — Z79899 Other long term (current) drug therapy: Secondary | ICD-10-CM | POA: Diagnosis not present

## 2016-11-29 DIAGNOSIS — I252 Old myocardial infarction: Secondary | ICD-10-CM | POA: Diagnosis not present

## 2016-11-29 NOTE — Patient Outreach (Signed)
[facility]) Care Management  11/29/2016  DAFFNEY GREENLY 09-25-1945 [id number]   [facility] CM collaborated with [facility] hospital liaison, J Minor Mrs Pewitt discharged from the hospital on Sunday November 27 2016  [facility] CM called to welcome her home and to attempt to initiate transition of care services [facility] CM did not get an answer at listed home number in EPIC An automated message stated the person at this number is not receiving calls at this time [facility] CM left a message at her daughter's number [facility] CM had success with reaching Mrs Virrueta previously when contacting her daughter)  [facility] CM spoke with her husband on his mobile number. [facility] CM had success with reaching Mrs Zappia previously) He reports "I'm in the hospital.  She is not available at this time" [facility] CM apologize for reaching him at a bad time and explained she was trying to reach Mrs Larock since her discharge from the hospital  Plan [facility] CM will try to reach Mrs Mian again this week for transition of services   Kimberly L. Lavina Hamman, RN, BSN, [place] Care Management [phone]

## 2016-12-03 ENCOUNTER — Inpatient Hospital Stay (HOSPITAL_COMMUNITY): Payer: BLUE CROSS/BLUE SHIELD

## 2016-12-03 ENCOUNTER — Inpatient Hospital Stay (HOSPITAL_COMMUNITY)
Admission: AD | Admit: 2016-12-03 | Discharge: 2016-12-12 | DRG: 280 | Disposition: A | Discharge status: Home / Self Care | Payer: BLUE CROSS/BLUE SHIELD | Source: Other Acute Inpatient Hospital | Attending: Internal Medicine | Admitting: Internal Medicine

## 2016-12-03 DIAGNOSIS — I509 Heart failure, unspecified: Secondary | ICD-10-CM | POA: Insufficient documentation

## 2016-12-03 DIAGNOSIS — Z515 Encounter for palliative care: Secondary | ICD-10-CM

## 2016-12-03 DIAGNOSIS — Z7901 Long term (current) use of anticoagulants: Secondary | ICD-10-CM | POA: Diagnosis not present

## 2016-12-03 DIAGNOSIS — J9601 Acute respiratory failure with hypoxia: Secondary | ICD-10-CM | POA: Diagnosis not present

## 2016-12-03 DIAGNOSIS — E669 Obesity, unspecified: Secondary | ICD-10-CM | POA: Diagnosis present

## 2016-12-03 DIAGNOSIS — N179 Acute kidney failure, unspecified: Secondary | ICD-10-CM | POA: Diagnosis not present

## 2016-12-03 DIAGNOSIS — I214 Non-ST elevation (NSTEMI) myocardial infarction: Secondary | ICD-10-CM | POA: Diagnosis present

## 2016-12-03 DIAGNOSIS — N183 Chronic kidney disease, stage 3 (moderate): Secondary | ICD-10-CM | POA: Diagnosis present

## 2016-12-03 DIAGNOSIS — I255 Ischemic cardiomyopathy: Secondary | ICD-10-CM | POA: Diagnosis present

## 2016-12-03 DIAGNOSIS — F419 Anxiety disorder, unspecified: Secondary | ICD-10-CM | POA: Diagnosis present

## 2016-12-03 DIAGNOSIS — N184 Chronic kidney disease, stage 4 (severe): Secondary | ICD-10-CM

## 2016-12-03 DIAGNOSIS — I480 Paroxysmal atrial fibrillation: Secondary | ICD-10-CM | POA: Diagnosis present

## 2016-12-03 DIAGNOSIS — R7989 Other specified abnormal findings of blood chemistry: Secondary | ICD-10-CM | POA: Diagnosis present

## 2016-12-03 DIAGNOSIS — Z955 Presence of coronary angioplasty implant and graft: Secondary | ICD-10-CM

## 2016-12-03 DIAGNOSIS — Z6830 Body mass index (BMI) 30.0-30.9, adult: Secondary | ICD-10-CM

## 2016-12-03 DIAGNOSIS — I130 Hypertensive heart and chronic kidney disease with heart failure and stage 1 through stage 4 chronic kidney disease, or unspecified chronic kidney disease: Secondary | ICD-10-CM | POA: Diagnosis present

## 2016-12-03 DIAGNOSIS — E1159 Type 2 diabetes mellitus with other circulatory complications: Secondary | ICD-10-CM | POA: Diagnosis present

## 2016-12-03 DIAGNOSIS — Z794 Long term (current) use of insulin: Secondary | ICD-10-CM | POA: Diagnosis not present

## 2016-12-03 DIAGNOSIS — I25118 Atherosclerotic heart disease of native coronary artery with other forms of angina pectoris: Secondary | ICD-10-CM | POA: Diagnosis not present

## 2016-12-03 DIAGNOSIS — D6859 Other primary thrombophilia: Secondary | ICD-10-CM | POA: Diagnosis not present

## 2016-12-03 DIAGNOSIS — I2510 Atherosclerotic heart disease of native coronary artery without angina pectoris: Secondary | ICD-10-CM | POA: Diagnosis present

## 2016-12-03 DIAGNOSIS — R0600 Dyspnea, unspecified: Secondary | ICD-10-CM

## 2016-12-03 DIAGNOSIS — J90 Pleural effusion, not elsewhere classified: Secondary | ICD-10-CM | POA: Diagnosis present

## 2016-12-03 DIAGNOSIS — R778 Other specified abnormalities of plasma proteins: Secondary | ICD-10-CM | POA: Diagnosis present

## 2016-12-03 DIAGNOSIS — D631 Anemia in chronic kidney disease: Secondary | ICD-10-CM | POA: Diagnosis not present

## 2016-12-03 DIAGNOSIS — K219 Gastro-esophageal reflux disease without esophagitis: Secondary | ICD-10-CM | POA: Diagnosis present

## 2016-12-03 DIAGNOSIS — N189 Chronic kidney disease, unspecified: Secondary | ICD-10-CM

## 2016-12-03 DIAGNOSIS — E785 Hyperlipidemia, unspecified: Secondary | ICD-10-CM | POA: Diagnosis not present

## 2016-12-03 DIAGNOSIS — I6523 Occlusion and stenosis of bilateral carotid arteries: Secondary | ICD-10-CM | POA: Diagnosis present

## 2016-12-03 DIAGNOSIS — E876 Hypokalemia: Secondary | ICD-10-CM | POA: Diagnosis not present

## 2016-12-03 DIAGNOSIS — R748 Abnormal levels of other serum enzymes: Secondary | ICD-10-CM

## 2016-12-03 DIAGNOSIS — E1122 Type 2 diabetes mellitus with diabetic chronic kidney disease: Secondary | ICD-10-CM | POA: Diagnosis present

## 2016-12-03 DIAGNOSIS — Z79899 Other long term (current) drug therapy: Secondary | ICD-10-CM

## 2016-12-03 DIAGNOSIS — Z7189 Other specified counseling: Secondary | ICD-10-CM | POA: Diagnosis not present

## 2016-12-03 DIAGNOSIS — E1151 Type 2 diabetes mellitus with diabetic peripheral angiopathy without gangrene: Secondary | ICD-10-CM | POA: Diagnosis not present

## 2016-12-03 DIAGNOSIS — N170 Acute kidney failure with tubular necrosis: Secondary | ICD-10-CM | POA: Diagnosis not present

## 2016-12-03 DIAGNOSIS — Z86718 Personal history of other venous thrombosis and embolism: Secondary | ICD-10-CM

## 2016-12-03 DIAGNOSIS — I252 Old myocardial infarction: Secondary | ICD-10-CM

## 2016-12-03 DIAGNOSIS — N3281 Overactive bladder: Secondary | ICD-10-CM | POA: Diagnosis present

## 2016-12-03 DIAGNOSIS — I10 Essential (primary) hypertension: Secondary | ICD-10-CM | POA: Diagnosis not present

## 2016-12-03 DIAGNOSIS — I5043 Acute on chronic combined systolic (congestive) and diastolic (congestive) heart failure: Secondary | ICD-10-CM | POA: Diagnosis not present

## 2016-12-03 DIAGNOSIS — I4891 Unspecified atrial fibrillation: Secondary | ICD-10-CM

## 2016-12-03 DIAGNOSIS — I25119 Atherosclerotic heart disease of native coronary artery with unspecified angina pectoris: Secondary | ICD-10-CM | POA: Diagnosis not present

## 2016-12-03 LAB — GLUCOSE, CAPILLARY
GLUCOSE-CAPILLARY: 244 mg/dL — AB (ref 65–99)
GLUCOSE-CAPILLARY: 269 mg/dL — AB (ref 65–99)
GLUCOSE-CAPILLARY: 286 mg/dL — AB (ref 65–99)
Glucose-Capillary: 311 mg/dL — ABNORMAL HIGH (ref 65–99)

## 2016-12-03 LAB — MRSA PCR SCREENING: MRSA BY PCR: NEGATIVE

## 2016-12-03 MED ORDER — DIAZEPAM 5 MG PO TABS
5.0000 mg | ORAL_TABLET | Freq: Two times a day (BID) | ORAL | Status: DC | PRN
  Administered 2016-12-04: 5 mg via ORAL
  Filled 2016-12-03: qty 1

## 2016-12-03 MED ORDER — ONDANSETRON HCL 4 MG/2ML IJ SOLN
4.0000 mg | Freq: Four times a day (QID) | INTRAMUSCULAR | Status: DC | PRN
  Administered 2016-12-04: 4 mg via INTRAVENOUS
  Filled 2016-12-03: qty 2

## 2016-12-03 MED ORDER — INSULIN ASPART 100 UNIT/ML ~~LOC~~ SOLN
0.0000 [IU] | Freq: Every day | SUBCUTANEOUS | Status: DC
  Administered 2016-12-03: 3 [IU] via SUBCUTANEOUS
  Administered 2016-12-04: 4 [IU] via SUBCUTANEOUS
  Administered 2016-12-05 – 2016-12-06 (×2): 3 [IU] via SUBCUTANEOUS
  Administered 2016-12-07: 4 [IU] via SUBCUTANEOUS
  Administered 2016-12-08: 3 [IU] via SUBCUTANEOUS
  Administered 2016-12-09: 4 [IU] via SUBCUTANEOUS
  Administered 2016-12-10: 2 [IU] via SUBCUTANEOUS
  Administered 2016-12-11: 5 [IU] via SUBCUTANEOUS

## 2016-12-03 MED ORDER — SODIUM CHLORIDE 0.9 % IV SOLN: 250.0000 mL | INTRAVENOUS | Status: DC | PRN

## 2016-12-03 MED ORDER — FUROSEMIDE 10 MG/ML IJ SOLN: 80.0000 mg | Freq: Two times a day (BID) | INTRAMUSCULAR | Status: DC

## 2016-12-03 MED ORDER — ALBUTEROL SULFATE (2.5 MG/3ML) 0.083% IN NEBU: 2.5000 mg | INHALATION_SOLUTION | Freq: Four times a day (QID) | RESPIRATORY_TRACT | Status: DC | PRN

## 2016-12-03 MED ORDER — INSULIN GLARGINE 100 UNIT/ML ~~LOC~~ SOLN
15.0000 [IU] | Freq: Every day | SUBCUTANEOUS | Status: DC
  Administered 2016-12-03 – 2016-12-08 (×6): 15 [IU] via SUBCUTANEOUS
  Filled 2016-12-03 (×6): qty 0.15

## 2016-12-03 MED ORDER — SODIUM CHLORIDE 0.9% FLUSH
3.0000 mL | Freq: Two times a day (BID) | INTRAVENOUS | Status: DC
  Administered 2016-12-03 – 2016-12-11 (×15): 3 mL via INTRAVENOUS

## 2016-12-03 MED ORDER — AMIODARONE HCL 200 MG PO TABS
200.0000 mg | ORAL_TABLET | Freq: Two times a day (BID) | ORAL | Status: DC
  Administered 2016-12-03 – 2016-12-12 (×18): 200 mg via ORAL
  Filled 2016-12-03 (×19): qty 1

## 2016-12-03 MED ORDER — ALBUTEROL SULFATE HFA 108 (90 BASE) MCG/ACT IN AERS: 1.0000 | INHALATION_SPRAY | Freq: Four times a day (QID) | RESPIRATORY_TRACT | Status: DC | PRN

## 2016-12-03 MED ORDER — ALPRAZOLAM 0.5 MG PO TABS
0.5000 mg | ORAL_TABLET | Freq: Every day | ORAL | Status: DC
  Administered 2016-12-03 – 2016-12-05 (×3): 0.5 mg via ORAL
  Administered 2016-12-06: 0.25 mg via ORAL
  Administered 2016-12-07 – 2016-12-11 (×5): 0.5 mg via ORAL
  Filled 2016-12-03 (×9): qty 1

## 2016-12-03 MED ORDER — FLUTICASONE PROPIONATE 50 MCG/ACT NA SUSP
1.0000 | Freq: Every day | NASAL | Status: DC
  Administered 2016-12-04 – 2016-12-12 (×9): 1 via NASAL
  Filled 2016-12-03 (×2): qty 16

## 2016-12-03 MED ORDER — WARFARIN - PHARMACIST DOSING INPATIENT
Freq: Every day | Status: DC
  Administered 2016-12-10: 18:00:00

## 2016-12-03 MED ORDER — LATANOPROST 0.005 % OP SOLN
1.0000 [drp] | Freq: Every day | OPHTHALMIC | Status: DC
  Administered 2016-12-03 – 2016-12-11 (×9): 1 [drp] via OPHTHALMIC
  Filled 2016-12-03 (×2): qty 2.5

## 2016-12-03 MED ORDER — FERROUS SULFATE 325 (65 FE) MG PO TABS
325.0000 mg | ORAL_TABLET | Freq: Every day | ORAL | Status: DC
  Administered 2016-12-04 – 2016-12-06 (×3): 325 mg via ORAL
  Filled 2016-12-03 (×3): qty 1

## 2016-12-03 MED ORDER — METOPROLOL SUCCINATE ER 100 MG PO TB24
100.0000 mg | ORAL_TABLET | Freq: Every day | ORAL | Status: DC
  Administered 2016-12-04: 100 mg via ORAL
  Filled 2016-12-03: qty 1

## 2016-12-03 MED ORDER — INSULIN ASPART 100 UNIT/ML ~~LOC~~ SOLN
0.0000 [IU] | Freq: Three times a day (TID) | SUBCUTANEOUS | Status: DC
  Administered 2016-12-03: 3 [IU] via SUBCUTANEOUS
  Administered 2016-12-03 – 2016-12-04 (×4): 5 [IU] via SUBCUTANEOUS
  Administered 2016-12-05 – 2016-12-06 (×5): 2 [IU] via SUBCUTANEOUS
  Administered 2016-12-06: 1 [IU] via SUBCUTANEOUS
  Administered 2016-12-07: 3 [IU] via SUBCUTANEOUS
  Administered 2016-12-07 – 2016-12-08 (×2): 2 [IU] via SUBCUTANEOUS
  Administered 2016-12-08: 5 [IU] via SUBCUTANEOUS
  Administered 2016-12-08: 3 [IU] via SUBCUTANEOUS
  Administered 2016-12-09: 1 [IU] via SUBCUTANEOUS
  Administered 2016-12-09: 3 [IU] via SUBCUTANEOUS
  Administered 2016-12-09: 2 [IU] via SUBCUTANEOUS
  Administered 2016-12-10: 3 [IU] via SUBCUTANEOUS
  Administered 2016-12-10 (×2): 2 [IU] via SUBCUTANEOUS
  Administered 2016-12-11 (×2): 1 [IU] via SUBCUTANEOUS
  Administered 2016-12-11: 5 [IU] via SUBCUTANEOUS
  Administered 2016-12-12: 2 [IU] via SUBCUTANEOUS
  Administered 2016-12-12: 3 [IU] via SUBCUTANEOUS

## 2016-12-03 MED ORDER — PANTOPRAZOLE SODIUM 40 MG PO TBEC
40.0000 mg | DELAYED_RELEASE_TABLET | Freq: Every day | ORAL | Status: DC
  Administered 2016-12-04 – 2016-12-12 (×9): 40 mg via ORAL
  Filled 2016-12-03 (×9): qty 1

## 2016-12-03 MED ORDER — INSULIN GLARGINE 100 UNIT/ML ~~LOC~~ SOLN: 30.0000 [IU] | Freq: Every day | SUBCUTANEOUS | Status: DC

## 2016-12-03 MED ORDER — ISOSORBIDE MONONITRATE ER 30 MG PO TB24
30.0000 mg | ORAL_TABLET | Freq: Every day | ORAL | Status: DC
  Administered 2016-12-04 – 2016-12-12 (×9): 30 mg via ORAL
  Filled 2016-12-03 (×9): qty 1

## 2016-12-03 MED ORDER — ROSUVASTATIN CALCIUM 40 MG PO TABS
40.0000 mg | ORAL_TABLET | Freq: Every day | ORAL | Status: DC
  Administered 2016-12-03 – 2016-12-04 (×2): 40 mg via ORAL
  Filled 2016-12-03 (×2): qty 1

## 2016-12-03 MED ORDER — FUROSEMIDE 10 MG/ML IJ SOLN: 60.0000 mg | Freq: Two times a day (BID) | INTRAMUSCULAR | Status: DC

## 2016-12-03 MED ORDER — SODIUM CHLORIDE 0.9% FLUSH
3.0000 mL | INTRAVENOUS | Status: DC | PRN
  Administered 2016-12-08 – 2016-12-09 (×2): 3 mL via INTRAVENOUS
  Filled 2016-12-03 (×2): qty 3

## 2016-12-03 MED ORDER — ACETAMINOPHEN 325 MG PO TABS
650.0000 mg | ORAL_TABLET | ORAL | Status: DC | PRN
  Administered 2016-12-10: 650 mg via ORAL
  Filled 2016-12-03: qty 2

## 2016-12-03 NOTE — Progress Notes (Signed)
Hold meds until labs come back per Dyanne Carrel, NPH

## 2016-12-03 NOTE — Progress Notes (Signed)
Texted sent to Dr Roderic Palau that IV unable to insert PICC or Midline today.  Called back with plan.

## 2016-12-03 NOTE — Progress Notes (Addendum)
ANTICOAGULATION CONSULT NOTE - Initial Consult  Pharmacy Consult for warfarin Indication: atrial fibrillation  Allergies  Allergen Reactions  . Ace Inhibitors Cough  . Amlodipine Other (See Comments)    Edema   . Codeine Rash    Patient Measurements: Height: 5\' 8"  (172.7 cm) Weight: 205 lb 11 oz (93.3 kg) IBW/kg (Calculated) : 63.9  Vital Signs: Temp: 98.1 F (36.7 C) (09/29 1935) Temp Source: Oral (09/29 1935) BP: 139/76 (09/29 1935) Pulse Rate: 75 (09/29 1935)  Labs: No results for input(s): HGB, HCT, PLT, APTT, LABPROT, INR, HEPARINUNFRC, HEPRLOWMOCWT, CREATININE, CKTOTAL, CKMB, TROPONINI in the last 72 hours.  Estimated Creatinine Clearance: 29.8 mL/min (A) (by C-G formula based on SCr of 2.04 mg/dL (H)).   Assessment: 71 YOF transferred from [facility] on 9/29. Per [facility] chart, patient was on warfarin 7.5mg  every other day PTA.   At [facility]: 9/25- warfarin 2.5mg  given, no INR in chart 9/26- warfarin 7.5mg  given, no INR in chart 9/27- INR 9.1, no warfarin given 9/28- clindamycin 600mg  IV q6h started, no INR in chart 9/29- INR 15.2. Vitamin K 2.5mg  po x1 given. Clindamycin continued  Unable to get INR here upon admission. PO vitamin K 2.5mg  is not going to lower an INR of 15.2 by very much.  CBC not checked upon transfer here. Hgb 8.2, plts 288 9/29 AM at [facility]. No bleeding noted per conversation with RN.   Goal of Therapy:  INR 2-3 Monitor platelets by anticoagulation protocol: Yes   Plan:  No warfarin tonight Daily INR Suspect patient may need more vitamin K Follow for s/s bleeding  Siena Poehler D. Korra Christine, PharmD, BCPS Clinical Pharmacist 12/03/2016 7:56 PM

## 2016-12-03 NOTE — Progress Notes (Signed)
Received patient from [facility] alert, oriented x3 and cooperative.  Paged for admission Dr  And orders.

## 2016-12-03 NOTE — Consult Note (Addendum)
Primary cardiologist: Dr Kate Sable Consulting cardiologist:Dr Carlyle Dolly Requesting physician: Dr Roderic Palau Indication: SOB  Clinical Summary [name] is a 71 y.o.female history of CAD with NSTEMI 06/2015 with DES to PDA and RCA at that time. She had a subtotal occlusion of LCX that was treated medically, with recs if viability shown could consider intervention. Repeat cath 9/201 with stable disease. Chronic systolic HF LVEF 18-29%, grade II diastolic dysfunction, right subclavian stenosis s/p intervention, DM2, HTN, HL, CKD IV   She reports feeling ok for about 1 day after recent [facility] discharge. The day following she developed significant SOB, confusion, and LE edema causing her to go to [facility] hospital.Admitted to [facility] with SOB. ABG per report pH 7.17/89, started on bipap with good response. Appears she was diuresed there as well. She reports her breathing today is much improved compared to what it was on initial presentation.     [facility] 12/03/16: Hgb 8.2, Plt 288, INR 15, Cr 2.3 to 3.6, Trop 2.4 [facility] CXR: left pleural effusion with left base consolidation, pulm congestion 11/2016 nuclear stress: large inferolatearl scare, LVEF 22%.  11/2015 cath: patent RCA stents, mod LAD disease, LCX CTO. Recs for medical thearpy.  11/2016 LVEF 30-35%, grade II diastolic dysfunciton Labs are pending CXR pending EKG pending    Allergies  Allergen Reactions  . Ace Inhibitors Cough  . Amlodipine Other (See Comments)    Edema   . Codeine Rash    Medications Scheduled Medications: . ALPRAZolam  0.5 mg Oral QHS  . amiodarone  200 mg Oral BID  . [START ON 12/04/2016] ferrous sulfate  325 mg Oral Q breakfast  . fluticasone  1 spray Each Nare Daily  . furosemide  80 mg Intravenous Q12H  . insulin aspart  0-5 Units Subcutaneous QHS  . insulin aspart  0-9 Units Subcutaneous TID WC  . insulin glargine  15 Units Subcutaneous QHS  . isosorbide mononitrate   30 mg Oral Daily  . latanoprost  1 drop Both Eyes QHS  . [START ON 12/04/2016] metoprolol succinate  100 mg Oral Q breakfast  . pantoprazole  40 mg Oral Daily  . rosuvastatin  40 mg Oral QHS  . sodium chloride flush  3 mL Intravenous Q12H     Infusions: . sodium chloride       PRN Medications:  sodium chloride, acetaminophen, albuterol, diazepam, ondansetron (ZOFRAN) IV, sodium chloride flush   Past Medical History:  Diagnosis Date  . Acute renal failure superimposed on stage 4 chronic kidney disease ([place]) 07/04/2015  . Allergic rhinitis   . Anemia   . Anxiety   . CAD in native artery    NSTEMI 07/2015 s/p DES to RCA and posterior PDA, PCI 10/2015 with scoring balloon to 85% ISR of distal RCA), known LAD/Cx disease treated medically  . Carotid artery disease ([place])    Mild bilateral carotid disease (1-39% 07/2015)  . Chronic combined systolic and diastolic CHF (congestive heart failure) ([place])   . CKD (chronic kidney disease), stage IV ([place])   . Constipation   . Essential hypertension   . GERD (gastroesophageal reflux disease)   . History of hysterectomy   . Hyperlipidemia   . Low back pain   . Obesity   . 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 Osteoarthritis   . Overactive bladder   . PVC's (premature ventricular contractions)   . Type  2 diabetes mellitus ([place])     Past Surgical History:  Procedure Laterality Date  . ABDOMINAL HYSTERECTOMY    . BACK SURGERY     multiple  . CARDIAC CATHETERIZATION  04/04/2006   Est EF of 60%  . CARDIAC CATHETERIZATION N/A 07/04/2015   Procedure: Left Heart Cath and Coronary Angiography;  Surgeon: Troy Sine, MD;  Location: [place] [facility];  Service: Cardiovascular;  Laterality: N/A;  . CARDIAC CATHETERIZATION N/A 07/04/2015   Procedure: Coronary Stent Intervention;  Surgeon: Troy Sine, MD;  Location: [place] [facility];  Service: Cardiovascular;   Laterality: N/A;  . CARDIAC CATHETERIZATION N/A 10/13/2015   Procedure: Left Heart Cath and Coronary Angiography;  Surgeon: Troy Sine, MD;  Location: [place] [facility];  Service: Cardiovascular;  Laterality: N/A;  . CARDIAC CATHETERIZATION N/A 12/02/2015   Procedure: Left Heart Cath and Coronary Angiography;  Surgeon: Peter M Martinique, MD;  Location: [place] [facility];  Service: Cardiovascular;  Laterality: N/A;  . CARPAL TUNNEL RELEASE    . CERVICAL BIOPSY     cervical lymph node biopsies  . COLONOSCOPY  May 2002   Dr. Irving Shows :Followup in 5 years, normal exam  . COLONOSCOPY  2008   Dr. Laural Golden: Very redundant colon with mild melanosis coli, splenic flexure polyp biopsy with acute complaint of benign colon polyp. Recommended ten-year followup  . CORONARY STENT PLACEMENT  04/11/2006   2 -- Taxus stents to the circumflex   . PERIPHERAL VASCULAR CATHETERIZATION Right 07/09/2015   Procedure: Upper Extremity Angiography;  Surgeon: Conrad Craigsville, MD;  Location: [place] [facility];  Service: Cardiovascular;  Laterality: Right;  . PERIPHERAL VASCULAR CATHETERIZATION Right 07/10/2015   Procedure: RIGHT SUBCLAVIAN ARTERY THROMBECTOMY;  Surgeon: Serafina Mitchell, MD;  Location: [facility];  Service: Vascular;  Laterality: Right;  . Tendonitis     bilateral elbow  . TRIGGER FINGER RELEASE      Family History  Problem Relation Age of Onset  . Diabetes Father   . Hypertension Father   . Stroke Father   . Hypertension Brother   . Aneurysm Brother   . Diabetes Brother   . Colon cancer Neg Hx     Social History [name] reports that she has never smoked. She has never used smokeless tobacco. [name] reports that she does not drink alcohol.  Review of Systems CONSTITUTIONAL: No weight loss, fever, chills, weakness or fatigue.  HEENT: Eyes: No visual loss, blurred vision, double vision or yellow sclerae. No hearing loss, sneezing, congestion, runny nose or sore throat.  SKIN: No rash or  itching.  CARDIOVASCULAR: per hpi RESPIRATORY: per hpi GASTROINTESTINAL: No anorexia, nausea, vomiting or diarrhea. No abdominal pain or blood.  GENITOURINARY: no polyuria, no dysuria NEUROLOGICAL: No headache, dizziness, syncope, paralysis, ataxia, numbness or tingling in the extremities. No change in bowel or bladder control.  MUSCULOSKELETAL: 1+ bialtearl LE edema HEMATOLOGIC: No anemia, bleeding or bruising.  LYMPHATICS: No enlarged nodes. No history of splenectomy.  PSYCHIATRIC: No history of depression or anxiety.      Physical Examination Blood pressure (!) 142/78, pulse 93, temperature 97.9 F (36.6 C), temperature source Oral, resp. rate (!) 23, height 5\' 8"  (1.727 m), weight 205 lb 11 oz (93.3 kg), SpO2 100 %.  Intake/Output Summary (Last 24 hours) at 12/03/16 1333 Last data filed at 12/03/16 1300  Gross per 24 hour  Intake  0 ml  Output              150 ml  Net             -150 ml    HEENT: sclera clear, throat clear  Cardiovascular: RRR, no m/r/g, JVD is elevated  Respiratory: mild crackles bilateral bases  GI: abdomen soft, NT, ND  MSK: 1+bilateral LE edema  Neuro: no focal deficits  Psych: appropriate affect   Lab Results  Basic Metabolic Panel:  Recent Labs Lab 11/27/16 0633  NA 140  K 3.6  CL 102  CO2 29  GLUCOSE 55*  BUN 40*  CREATININE 2.04*  CALCIUM 8.8*    Liver Function Tests: No results for input(s): AST, ALT, ALKPHOS, BILITOT, PROT, ALBUMIN in the last 168 hours.  CBC:  Recent Labs Lab 11/27/16 0633  WBC 4.4  HGB 8.9*  HCT 29.2*  MCV 96.7  PLT 413*    Cardiac Enzymes: No results for input(s): CKTOTAL, CKMB, CKMBINDEX, TROPONINI in the last 168 hours.  BNP: Invalid input(s): POCBNP      Impression/Recommendations  1. Acute on chronic systolic HF - 0/9983 echo LVEF 30-35% - no ACEI,ARB/ARNI/aldactone due to poor renal funciton - admit weight today 205 lbs, similar to discharge weight 11/27/16 -  appears acute on chronic systolic HF had at least some role in her initial presentation to [facility] a few days ago. Appears she has diuresed. Still with some evidence of fluid overload. OSH labs show Cr up to 3.6, we will hold diuretics today, f/u repeat labs      2. PAF - admit earlier 11/2016 with afib with RVR - on amio started 11/23/16 during admission, CHADS2Vasc score is 5 she is on coumadin - INR reported at 15 at OSH, coumadin on hold. If repeat INR above 9 would give 2.5mg  of oral vitamin K  3. Pleural effusion - recent right thoracentesis  4. CKD IV - Cr baseline aroune 2, from OSH labs up to 3.6. Hold diuretics today, f/u repeat labs.  5. Supratherapeutic INR - INR reported to be 15 at OSH, if confirmed here would give 2.5mg  or oral vit K.   6. CAD/Elevated troponin - suspect demand ischemia, trop up to 2.4 at OSH. She has known disease as described above with cath last year and recent nuclear stress test. Essentially  - continue medical therapy, no plans for repeat ischemic testing.   Carlyle Dolly, M.D.

## 2016-12-03 NOTE — Progress Notes (Signed)
Case discussed with Dr. Moshe Cipro on call for [place] [facility]. Dr. Moshe Cipro feels that it would be reasonable to place a PICC line for IV access, although she would prefer a midline. Will inform PICC team  MEMON,JEHANZEB

## 2016-12-03 NOTE — H&P (Signed)
History and Physical    [name] OVZ:[id number] DOB: [birth year] DOA: 12/03/2016  PCP: [name], MD Patient coming from: [facility]  Chief Complaint: sob/acute on chronic HF  HPI: [name] is a very pleasant 71 y.o. 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 fib, recent pneumonia admitted to [facility] room 11 on unit 61M from [facility] with chief complaint acute on chronic heart failure as well as acute on chronic renal failure and elevated INR.  Information is obtained from the patient and the chart. Patient recently hospitalized with new onset A. fib as well as pneumonia she developed a pleural effusion that required thoracentesis. She was discharged home on anticoagulation. 3 days ago she presented to the emergency department in respiratory failure and started on BiPAP. She also had elevated troponin and elevated INR. In addition she had acute on chronic renal failure. She needed cardiology consultation and no cardiology coverage was available. She was transferred to [place]    ED Course: Upon arrival she is afebrile hemodynamically stable and not hypoxic. Oxygen saturation level greater than 90% on 2 L nasal cannula.  Review of Systems: As per HPI otherwise all other systems reviewed and are negative.   Ambulatory Status: Ambulates independently at home. Lives at home with husband is independent with ADLs  Past Medical History:  Diagnosis Date  . Acute renal failure superimposed on stage 4 chronic kidney disease ([place]) 07/04/2015  . Allergic rhinitis   . Anemia   . Anxiety   . CAD in native artery    NSTEMI 07/2015 s/p DES to RCA and posterior PDA, PCI 10/2015 with scoring balloon to 85% ISR of  distal RCA), known LAD/Cx disease treated medically  . Carotid artery disease ([place])    Mild bilateral carotid disease (1-39% 07/2015)  . Chronic combined systolic and diastolic CHF (congestive heart failure) ([place])   . CKD (chronic kidney disease), stage IV ([place])   . Constipation   . Essential hypertension   . GERD (gastroesophageal reflux disease)   . History of hysterectomy   . Hyperlipidemia   . Low back pain   . Obesity   . 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 Osteoarthritis   . Overactive bladder   . PVC's (premature ventricular contractions)   . Type 2 diabetes mellitus ([place])     Past Surgical History:  Procedure Laterality Date  . ABDOMINAL HYSTERECTOMY    . BACK SURGERY     multiple  . CARDIAC CATHETERIZATION  04/04/2006   Est EF of 60%  . CARDIAC CATHETERIZATION N/A 07/04/2015   Procedure: Left Heart Cath and Coronary Angiography;  Surgeon: Troy Sine, MD;  Location: [facility];  Service: Cardiovascular;  Laterality: N/A;  . CARDIAC CATHETERIZATION N/A 07/04/2015   Procedure: Coronary Stent Intervention;  Surgeon: Troy Sine, MD;  Location: [facility];  Service: Cardiovascular;  Laterality: N/A;  . CARDIAC CATHETERIZATION N/A 10/13/2015   Procedure: Left Heart Cath and Coronary Angiography;  Surgeon: Troy Sine, MD;  Location: [facility];  Service: Cardiovascular;  Laterality: N/A;  . CARDIAC CATHETERIZATION N/A 12/02/2015   Procedure: Left Heart Cath and Coronary Angiography;  Surgeon: Peter M Martinique, MD;  Location: [facility];  Service: Cardiovascular;  Laterality: N/A;  . CARPAL TUNNEL RELEASE    . CERVICAL BIOPSY     cervical lymph node biopsies  . COLONOSCOPY  May 2002   Dr. Irving Shows :Followup in 5 years, normal exam  . COLONOSCOPY  2008   Dr. Laural Golden: Very redundant colon with mild melanosis coli, splenic flexure polyp biopsy with acute complaint  of benign colon polyp. Recommended ten-year followup  . CORONARY STENT PLACEMENT  04/11/2006   2 -- Taxus stents to the circumflex   . PERIPHERAL VASCULAR CATHETERIZATION Right 07/09/2015   Procedure: Upper Extremity Angiography;  Surgeon: Conrad [place], MD;  Location: [facility];  Service: Cardiovascular;  Laterality: Right;  . PERIPHERAL VASCULAR CATHETERIZATION Right 07/10/2015   Procedure: RIGHT SUBCLAVIAN ARTERY THROMBECTOMY;  Surgeon: Serafina Mitchell, MD;  Location: [facility];  Service: Vascular;  Laterality: Right;  . Tendonitis     bilateral elbow  . TRIGGER FINGER RELEASE      Social History   Social History  . Marital status: Married    Spouse name: N/A  . Number of children: 3  . Years of education: N/A   Occupational History  . Not on file.   Social History Main Topics  . Smoking status: Never Smoker  . Smokeless tobacco: Never Used  . Alcohol use No  . Drug use: No  . Sexual activity: Not on file   Other Topics Concern  . Not on file   Social History Narrative  . No narrative on file  Lives at home with her husband. 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 Ace Inhibitors Cough  . Amlodipine     Edema   . Codeine Rash    Family History  Problem Relation Age of Onset  . Diabetes Father   . Hypertension Father   . Stroke Father   . Hypertension Brother   . Aneurysm Brother   . Diabetes Brother   . Colon cancer Neg Hx     Prior to Admission medications   Medication Sig Start Date End Date Taking? Authorizing Provider  acetaminophen (TYLENOL) 500 MG tablet Take 2 tablets (1,000 mg total) by mouth every 6 (six) hours as needed (pain). 07/12/15   Lily Kocher, MD  albuterol (PROVENTIL HFA;VENTOLIN HFA) [phone] Base) MCG/ACT inhaler Inhale 1-2 puffs into the lungs every 6 (six) hours as needed for wheezing or shortness of breath. 04/01/16   Varney Biles, MD  albuterol (PROVENTIL) (2.5 MG/3ML) 0.083%  nebulizer solution Take 3 mLs (2.5 mg total) by nebulization every 6 (six) hours as needed for wheezing or shortness of breath. 07/16/16   Elgergawy, Silver Huguenin, MD  ALPRAZolam Duanne Moron) 0.5 MG tablet Take 0.5 mg by mouth at bedtime.    [provider]  amiodarone (PACERONE) 200 MG tablet Take 1 tablet (200 mg total) by mouth 2 (two) times daily. New medication to control your heart rate. 11/27/16   Rexene Alberts, MD  cyclobenzaprine (FLEXERIL) 5 MG tablet Take 5 mg by mouth at bedtime. 10/03/16   [provider]  diazepam (VALIUM) 5 MG tablet Take 1 tablet (5 mg total) by mouth every 12 (twelve) hours as needed for muscle spasms. 10/29/16   Julianne Rice, MD  ferrous sulfate 325 (65 FE) MG tablet Take 325 mg by mouth daily with breakfast.     [provider]  fluticasone (FLONASE) 50 MCG/ACT nasal spray  Place 1 spray into both nostrils daily.    [provider]  hydrALAZINE (APRESOLINE) 100 MG tablet Take 0.5 tablets (50 mg total) by mouth 2 (two) times daily. 11/27/16   Rexene Alberts, MD  insulin glargine (LANTUS) 100 UNIT/ML injection Inject 0.3 mLs (30 Units total) into the skin at bedtime. 11/27/16   Rexene Alberts, MD  insulin lispro (HUMALOG KWIKPEN) 100 UNIT/ML KiwkPen INJECT 5 TO 20 TOTAL INTO THE SKIN THREE TIMES DAILY 11/27/16   Rexene Alberts, MD  isosorbide mononitrate (IMDUR) 60 MG 24 hr tablet Take 0.5 tablets (30 mg total) by mouth daily. 11/27/16   Rexene Alberts, MD  latanoprost (XALATAN) 0.005 % ophthalmic solution Place 1 drop into both eyes at bedtime.     [provider]  meclizine (ANTIVERT) 25 MG tablet Take 12.5 mg by mouth 2 (two) times daily.     [provider]  metoprolol succinate (TOPROL-XL) 100 MG 24 hr tablet Take 1 tablet (100 mg total) by mouth daily with breakfast. Take with or immediately following a meal. 11/28/16   Rexene Alberts, MD  Multiple Vitamin (MULTIVITAMIN WITH MINERALS) TABS tablet Take 1 tablet by mouth  daily.    [provider]  nitroGLYCERIN (NITROSTAT) 0.4 MG SL tablet Place 1 tablet (0.4 mg total) under the tongue every 5 (five) minutes as needed for chest pain. 10/14/15   Lyda Jester M, PA-C  pantoprazole (PROTONIX) 40 MG tablet Take 40 mg by mouth daily.     [provider]  potassium chloride SA (K-DUR,KLOR-CON) 20 MEQ tablet Take 2 tablets (40 mEq total) by mouth daily. 10/14/15   Lyda Jester M, PA-C  rosuvastatin (CRESTOR) 40 MG tablet Take 1 tablet (40 mg total) by mouth at bedtime. 07/12/15   Lily Kocher, MD  ticagrelor (BRILINTA) 60 MG TABS tablet Take 1 tablet (60 mg total) by mouth 2 (two) times daily. 11/16/16   Reyne Dumas, MD  torsemide (DEMADEX) 20 MG tablet Take 4 tablets (80 mg total) by mouth 2 (two) times daily. Pt is able to take an additional tablet daily for weight gain greater than 3lbs. 11/27/16   Rexene Alberts, MD  trolamine salicylate (ASPERCREME) 10 % cream Apply 1 application topically as needed for muscle pain.    [provider]  warfarin (COUMADIN) 7.5 MG tablet Your dosing has been changed to one tablet alternating with a half a tablet daily. Starting on the evening of Monday 11/28/16, take 1 tablet, then on Tuesday 9/25 take a half a tablet, then follow the pattern of one tablet alternating with a half a tablet daily. 11/27/16   Rexene Alberts, MD    Physical Exam: Vitals:   12/03/16 1032  BP: 118/73  Pulse: 93  Temp: 97.7 F (36.5 C)  TempSrc: Oral  SpO2: 100%  Weight: 93.3 kg (205 lb 11 oz)  Height: 5\' 8"  (1.727 m)     General:  Appears calm and Somewhat lethargic in no acute distress Eyes:  PERRL, EOMI, normal lids, iris ENT:  grossly normal hearing, lips & tongue, mucous membranes of her mouth are pink only slightly dry Neck:  no LAD, masses or thyromegaly Cardiovascular:  RRR,  +murmur. Trace -1+ LE edema.  Respiratory:  Moderate increased work of breathing with conversation. Breath sounds are quite distant.  Fine crackles bilateral bases. No wheeze no rhonchi Abdomen:  soft, ntnd, positive bowel sounds no guarding or rebounding Skin:  no rash or induration seen on limited exam Musculoskeletal:  grossly normal tone BUE/BLE,  good ROM, no bony abnormality Psychiatric:  grossly normal mood and affect, speech fluent and appropriate, AOx3 Neurologic:  Alert and oriented 3 speech clear facial symmetry moves all extremities spontaneously bilateral grip 5 out of 5  Labs on Admission: I have personally reviewed following labs and imaging studies  CBC:  Recent Labs Lab 11/27/16 0633  WBC 4.4  HGB 8.9*  HCT 29.2*  MCV 96.7  PLT 379*   Basic Metabolic Panel:  Recent Labs Lab 11/27/16 0633  NA 140  K 3.6  CL 102  CO2 29  GLUCOSE 55*  BUN 40*  CREATININE 2.04*  CALCIUM 8.8*   GFR: Estimated Creatinine Clearance: 29.8 mL/min (A) (by C-G formula based on SCr of 2.04 mg/dL (H)). Liver Function Tests: No results for input(s): AST, ALT, ALKPHOS, BILITOT, PROT, ALBUMIN in the last 168 hours. No results for input(s): LIPASE, AMYLASE in the last 168 hours. No results for input(s): AMMONIA in the last 168 hours. Coagulation Profile:  Recent Labs Lab 11/27/16 0633  INR 2.69   Cardiac Enzymes: No results for input(s): CKTOTAL, CKMB, CKMBINDEX, TROPONINI in the last 168 hours. BNP (last 3 results) No results for input(s): PROBNP in the last 8760 hours. HbA1C: No results for input(s): HGBA1C in the last 72 hours. CBG:  Recent Labs Lab 11/27/16 0734 11/27/16 0853 11/27/16 1145 11/27/16 1641 11/27/16 1828  GLUCAP 51* 119* 299* 420* 258*   Lipid Profile: No results for input(s): CHOL, HDL, LDLCALC, TRIG, CHOLHDL, LDLDIRECT in the last 72 hours. Thyroid Function Tests: No results for input(s): TSH, T4TOTAL, FREET4, T3FREE, THYROIDAB in the last 72 hours. Anemia Panel: No results for input(s): VITAMINB12, FOLATE, FERRITIN, TIBC, IRON, RETICCTPCT in the last 72 hours. Urine  analysis:    Component Value Date/Time   COLORURINE YELLOW 11/10/2016 2055   APPEARANCEUR CLOUDY (A) 11/10/2016 2055   LABSPEC 1.012 11/10/2016 2055   PHURINE 5.0 11/10/2016 2055   GLUCOSEU NEGATIVE 11/10/2016 2055   HGBUR NEGATIVE 11/10/2016 2055   BILIRUBINUR NEGATIVE 11/10/2016 2055   [place] 11/10/2016 2055   PROTEINUR 100 (A) 11/10/2016 2055   UROBILINOGEN 0.2 08/23/2007 2037   NITRITE NEGATIVE 11/10/2016 2055   LEUKOCYTESUR MODERATE (A) 11/10/2016 2055    Creatinine Clearance: Estimated Creatinine Clearance: 29.8 mL/min (A) (by C-G formula based on SCr of 2.04 mg/dL (H)).  Sepsis Labs: @LABRCNTIP (procalcitonin:4,lacticidven:4) )No results found for this or any previous visit (from the past 240 hour(s)).   Radiological Exams on Admission: No results found.  EKG: Independently reviewed. pending  Assessment/Plan Principal Problem:   Acute on chronic combined systolic and diastolic CHF (congestive heart failure) (HCC) Active Problems:   Essential hypertension   Coronary artery disease   GERD   Elevated troponin   Acute renal failure superimposed on chronic kidney disease (H[facility])   Acute respiratory failure with hypoxia ([facility])   DM type 2 causing vascular disease (H[facility])   Pleural effusion on right   Anemia in chronic kidney disease   Atrial fibrillation ([place])   Ischemic cardiomyopathy   Hypercoagulopathy ([place])   #1. Acute on chronic combined systolic diastolic heart failure. Appears to be overloaded on admission. Weight 205lb.  Recent hospitalization for same. Echo done 2 weeks ago with an EF of 02-40% grade 2 diastolic dysfunction. She also had a nuclear stress test on September 11 revealing a large scar consistent with prior infarct but no ischemic territories. She was evaluated by cardiology at that time and medication adjustments were made. -Admit to step down -Obtain complete  metabolic panel, complete blood count, BNP - Continue home amiodarone, imdur,  toprol -Lasix 80 mg IV twice a day -EKG -Cycle troponins -Daily weights -Intake and output -Request cardiology consult  #2. Acute renal failure superimposed on chronic kidney disease stage IV. Creatinine 3.22 days ago. Chart review indicates her baseline is closer to 2.3. Likely related to above -See #1 -Monitor urine output -Hold nephrotoxins as able -If no improvement consider renal ultrasound and/or nephrology consult  3. Acute respiratory failure with hypoxia. Resolved on admission. Patient did require BiPAP 24 hours ago. Likely related to #1 in the setting of recent pneumonia. -Continue oxygen supplementation as needed -Obtain a chest x-ray -See #1 and #2 -Monitor  4. Elevated INR. INR greater than 15 leading to documentation. No signs symptoms obvious bleeding. Chart review indicates received vitamin K yesterday. Patient is newly on Coumadin for new A. Fib. - Coumadin per pharmacy -Obtain INR -Consider holding amiodarone -Vitamin K fresh frozen plasma as indicated  #5 A. Fib. [place] vasc score5. Recent EKG showed normal sinus rhythm. Home medications include amiodarone and Toprol. -Obtain an EKG -Continue amiodarone and beta blocker -Coumadin per pharmacy  #6. CAD with stents/elevated troponins. No chest pain. Elevated troponins presumably related to demand ischemia. -Cycle troponin -Serial EKG -Cardiology consult  #7. Diabetes. Type II. Home medications include Lantus and sliding scale -Continue Lantus at lower dose -Obtain a hemoglobin A1c -Sliding scale insulin for optimal control  #8. Pleural effusion. Recent thoracentesis where 1.7 L was removed. Chart review indicates fluid was thought to be a transudate from CHF. Cytology was negative for malignancy and revealed reactive mesothelial cells only -Chest x-ray  #9. Subclavian artery occlusion. Chart review indicates patient underwent thrombectomy on May 2017 and again in July 2018. Home medications include aspirin  and Brilinta. Aspirin discontinued by cardiology  #10. Anemia of chronic disease. She did have heme positive stools. Baseline hemoglobin 8.1-9.5. Hemoglobin 8.2 -Obtain CBC -Continue iron supplement -PPI      DVT prophylaxis: scd  Code Status: full  Family Communication: none present  Disposition Plan: home  Consults called:  tilley Admission status: inpatient     Radene Gunning MD [facility]  If 7PM-7AM, please contact night-coverage [email or web address] Password [facility]  12/03/2016, 11:41 AM     Attending note:  Chart reviewed Case discussed with Dyanne Carrel, NP  Patient seen and examined. She still feels short of breath, although better than when she first presented to [facility]. Denies any chest pain at present.  1. Acute on chronic combined CHF. Recent ejection fraction noted to be 30-35%. She presents with volume overload. Will continue on IV Lasix. The patient has had several admissions in the past few weeks for CHF exacerbation. Cardiology has been consulted to assist with further care.  2. Acute kidney injury on chronic kidney disease. Unclear if this is related to decompensated CHF versus diuretics received at outside hospital. Continue to monitor urine output and serum creatinine while diuresing. If renal function worsens, may need to consider nephrology input versus considering inotropic therapy.  3. Atrial fibrillation. Currently in sinus rhythm. Continue amiodarone and Toprol. INR is markedly elevated. We'll hold the Coumadin for now. Pharmacy to adjust Coumadin. She does not have any signs of bleeding.  4. Anemia of chronic disease. Hemoglobin appears to be at baseline. She does have a history of heme positive stools, but does not have any evidence of gross bleeding at this time. Hemoglobin has been stable. Would expect some heme positive stools with  elevated INR at this time. Continue to monitor hemoglobin for now. Coumadin is on hold.  5. Elevated  troponin. Noted to have elevated troponin greater than 2 at [facility]. Does not appear to have any chest pain. INR is therapeutic. Cardiology has been consulted. I suspect medical management would be most appropriate  6. Goals of care. Would likely benefit from palliative care consult to address CODE STATUS and further goals of care considering she has had multiple admissions in the past few weeks.  Melvine Julin

## 2016-12-03 NOTE — Progress Notes (Signed)
Spoke with Morrie Sheldon, RN concerning PICC order, informed PICC will not be placed today. Needs PICC for lab draws, PICC can be placed on 9/30. Will continue to monitor.

## 2016-12-03 NOTE — Progress Notes (Signed)
Spoke with Mitzi RN regarding  PICC and midline order to be done 12-04-16 or other option is for MD to place CVC

## 2016-12-03 NOTE — Progress Notes (Signed)
PICC d/w pt at length re risks and benefits. Pt states she is seen by [facility] and has d/w them potential for dialysis.  Dr Roderic Palau notified and is to consult renal re PICC placement.  Will follow up.  Mitzi RN aware.

## 2016-12-04 ENCOUNTER — Encounter (HOSPITAL_COMMUNITY): Payer: Self-pay | Admitting: *Deleted

## 2016-12-04 DIAGNOSIS — D6859 Other primary thrombophilia: Secondary | ICD-10-CM

## 2016-12-04 DIAGNOSIS — I480 Paroxysmal atrial fibrillation: Secondary | ICD-10-CM

## 2016-12-04 DIAGNOSIS — I25119 Atherosclerotic heart disease of native coronary artery with unspecified angina pectoris: Secondary | ICD-10-CM

## 2016-12-04 LAB — URINALYSIS, ROUTINE W REFLEX MICROSCOPIC
BILIRUBIN URINE: NEGATIVE
Glucose, UA: NEGATIVE mg/dL
Ketones, ur: NEGATIVE mg/dL
Nitrite: NEGATIVE
PROTEIN: 100 mg/dL — AB
SPECIFIC GRAVITY, URINE: 1.013 (ref 1.005–1.030)
pH: 5 (ref 5.0–8.0)

## 2016-12-04 LAB — COMPREHENSIVE METABOLIC PANEL
ALK PHOS: 125 U/L (ref 38–126)
ALT: 40 U/L (ref 14–54)
AST: 51 U/L — ABNORMAL HIGH (ref 15–41)
Albumin: 2.5 g/dL — ABNORMAL LOW (ref 3.5–5.0)
Anion gap: 13 (ref 5–15)
BUN: 87 mg/dL — ABNORMAL HIGH (ref 6–20)
CALCIUM: 8.3 mg/dL — AB (ref 8.9–10.3)
CO2: 23 mmol/L (ref 22–32)
Chloride: 92 mmol/L — ABNORMAL LOW (ref 101–111)
Creatinine, Ser: 4.39 mg/dL — ABNORMAL HIGH (ref 0.44–1.00)
GFR, EST AFRICAN AMERICAN: 11 mL/min — AB (ref >60)
GFR, EST NON AFRICAN AMERICAN: 9 mL/min — AB (ref >60)
Glucose, Bld: 289 mg/dL — ABNORMAL HIGH (ref 65–99)
Potassium: 4.4 mmol/L (ref 3.5–5.1)
Sodium: 128 mmol/L — ABNORMAL LOW (ref 135–145)
TOTAL PROTEIN: 6.6 g/dL (ref 6.5–8.1)
Total Bilirubin: 0.7 mg/dL (ref 0.3–1.2)

## 2016-12-04 LAB — BASIC METABOLIC PANEL
Anion gap: 12 (ref 5–15)
BUN: 83 mg/dL — ABNORMAL HIGH (ref 6–20)
CALCIUM: 8.4 mg/dL — AB (ref 8.9–10.3)
CHLORIDE: 92 mmol/L — AB (ref 101–111)
CO2: 24 mmol/L (ref 22–32)
CREATININE: 4.28 mg/dL — AB (ref 0.44–1.00)
GFR calc non Af Amer: 9 mL/min — ABNORMAL LOW (ref >60)
GFR, EST AFRICAN AMERICAN: 11 mL/min — AB (ref >60)
Glucose, Bld: 301 mg/dL — ABNORMAL HIGH (ref 65–99)
Potassium: 4.3 mmol/L (ref 3.5–5.1)
SODIUM: 128 mmol/L — AB (ref 135–145)

## 2016-12-04 LAB — CBC
HCT: 26.2 % — ABNORMAL LOW (ref 36.0–46.0)
Hemoglobin: 8.2 g/dL — ABNORMAL LOW (ref 12.0–15.0)
MCH: 29.1 pg (ref 26.0–34.0)
MCHC: 31.3 g/dL (ref 30.0–36.0)
MCV: 92.9 fL (ref 78.0–100.0)
PLATELETS: 269 10*3/uL (ref 150–400)
RBC: 2.82 MIL/uL — AB (ref 3.87–5.11)
RDW: 17.4 % — ABNORMAL HIGH (ref 11.5–15.5)
WBC: 7 10*3/uL (ref 4.0–10.5)

## 2016-12-04 LAB — TROPONIN I
TROPONIN I: 5.78 ng/mL — AB (ref <0.03)
Troponin I: 7.82 ng/mL (ref <0.03)

## 2016-12-04 LAB — GLUCOSE, CAPILLARY
GLUCOSE-CAPILLARY: 283 mg/dL — AB (ref 65–99)
GLUCOSE-CAPILLARY: 268 mg/dL — AB (ref 65–99)
Glucose-Capillary: 284 mg/dL — ABNORMAL HIGH (ref 65–99)
Glucose-Capillary: 306 mg/dL — ABNORMAL HIGH (ref 65–99)

## 2016-12-04 LAB — BRAIN NATRIURETIC PEPTIDE: B NATRIURETIC PEPTIDE 5: 2222.9 pg/mL — AB (ref 0.0–100.0)

## 2016-12-04 LAB — PROTIME-INR
INR: 6.71 — AB
PROTHROMBIN TIME: 57.9 s — AB (ref 11.4–15.2)

## 2016-12-04 MED ORDER — SODIUM CHLORIDE 0.9% FLUSH
10.0000 mL | Freq: Two times a day (BID) | INTRAVENOUS | Status: DC
  Administered 2016-12-04 – 2016-12-05 (×2): 10 mL
  Administered 2016-12-05: 20 mL
  Administered 2016-12-05 – 2016-12-09 (×7): 10 mL
  Administered 2016-12-09: 20 mL
  Administered 2016-12-10 – 2016-12-12 (×3): 10 mL

## 2016-12-04 MED ORDER — DARBEPOETIN ALFA 100 MCG/0.5ML IJ SOSY
100.0000 ug | PREFILLED_SYRINGE | INTRAMUSCULAR | Status: DC
  Administered 2016-12-04: 100 ug via SUBCUTANEOUS
  Filled 2016-12-04: qty 0.5

## 2016-12-04 MED ORDER — CHLORHEXIDINE GLUCONATE CLOTH 2 % EX PADS
6.0000 | MEDICATED_PAD | Freq: Every day | CUTANEOUS | Status: DC
  Administered 2016-12-04 – 2016-12-08 (×2): 6 via TOPICAL

## 2016-12-04 MED ORDER — SODIUM CHLORIDE 0.9% FLUSH
10.0000 mL | INTRAVENOUS | Status: DC | PRN
  Administered 2016-12-09: 10 mL
  Filled 2016-12-04: qty 40

## 2016-12-04 MED ORDER — METOPROLOL SUCCINATE ER 50 MG PO TB24
50.0000 mg | ORAL_TABLET | Freq: Every day | ORAL | Status: DC
  Administered 2016-12-05: 50 mg via ORAL
  Filled 2016-12-04: qty 1

## 2016-12-04 NOTE — Progress Notes (Signed)
Spoke with [name] regarding PICC vs midline.  She will d/w MD on rounds and notify of decision that best suits pt needs.

## 2016-12-04 NOTE — Progress Notes (Signed)
Patient ID: [name], female   DOB: [birth year], 71 y.o.   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 Fib started on anticoagulation, recent acute on chronic heart failure and pneumonia for which she needed hospitalization and thoracentesis was transferred from [facility] for complaints of worsening shortness of breath. She was admitted with acute on chronic heart failure and acute on chronic kidney disease.   Assessment & Plan:   Principal Problem:   Acute on chronic combined systolic and diastolic CHF (congestive heart failure) (HCC) Active Problems:   Essential hypertension   Coronary artery disease   GERD   Elevated troponin   Acute renal failure superimposed on chronic kidney disease (HCC)   Acute respiratory failure with hypoxia (HCC)   DM type 2 causing vascular disease (HCC)   Pleural effusion on right   Anemia in chronic kidney disease   Atrial fibrillation (HCC)   Ischemic cardiomyopathy   Hypercoagulopathy (HCC)   Acute on chronic combined systolic diastolic heart failure recent echo showing ejection fraction of 30-35% and grade 2 diastolic dysfunction: - She also had a nuclear stress test on September 11 revealing a large scar consistent with prior infarct but no ischemic territories. - cardiology following. Diuretics on hold because of worsening renal function. Continue Imdur, Toprol, amiodarone. Follow further cardiology recommendations. -Daily weights -strict Intake and output - patient has had recent multiple admissions. Palliative care consult for goals of care   Acute  renal failure superimposed on chronic kidney disease stage IV - creatinine worsening. Nephrology consulted. Follow recommendations. -Monitor urine output -Hold nephrotoxins as able  Acute respiratory failure with hypoxia.  - Resolved on admission. Patient did require BiPAP 24 hours ago.-Continue oxygen supplementation as needed -Obtain a chest x-ray   Elevated INR. - INR 6.71 today. No signs of bleeding. Hold Coumadin. Repeat INR in a.m.   Recent diagnosis of A. Fib. [place] vasc score5 - currently in sinus rhythm. Continue amiodarone, Toprol. Follow cardiology recommendations  CAD with stents/elevated troponins. No chest pain. Elevated troponins presumably related to demand ischemia. -Cycle troponin -Cardiology following   Diabetes mellitus type II - continue Lantus and SSI    Pleural effusion. Recent thoracentesis where 1.7 L was removed. Chart review indicates fluid was thought to be a transudate from CHF. Cytology was negative for malignancy and revealed reactive mesothelial cells only -repeat chest x-ray in a.m.  Subclavian artery occlusion. Chart review indicates patient underwent thrombectomy on May 2017 and again in July 2018.   Anemia of chronic disease.  - hemoglobin stable. Monitor    DVT prophylaxis: SCDs Code Status:  full Family Communication: none at bedside Disposition Plan:depends on clinical improvement  Consultants: cardiology and nephrology  Procedures: none  Antimicrobials: none    Subjective: Patient seen and examined at bedside. She does not feel well and feels short of breath. No current chest pain, nausea or vomiting.  Objective: Vitals:   12/03/16 2324 12/04/16 0501 12/04/16 0753 12/04/16 0911  BP: 121/62 122/63  106/62  Pulse: 78 82 65 77  Resp: (!) 22 (!) 37    Temp: 98.1 F (36.7 C) 97.9 F (36.6 C) 98.2 F (36.8  C)   TempSrc: Oral Oral Oral   SpO2: 94% 94%    Weight:  88 kg (194 lb)    Height:        Intake/Output  Summary (Last 24 hours) at 12/04/16 6160 Last data filed at 12/04/16 0912  Gross per 24 hour  Intake              243 ml  Output              725 ml  Net             -482 ml   Filed Weights   12/03/16 1032 12/04/16 0501  Weight: 93.3 kg (205 lb 11 oz) 88 kg (194 lb)    Examination:  General exam: Appears calm and comfortable but older than stated age Respiratory system: Bilateral decreased breath sound at bases with scattered basilar crackles Cardiovascular system: S1 & S2 heard, rate controlled  Gastrointestinal: soft, nontender bowel sounds positive Extremities: No cyanosis, clubbing; bilateral lower extremity 2+ edema  Data Reviewed: I have personally reviewed following labs and imaging studies  CBC:  Recent Labs Lab 12/04/16 0003  WBC 7.0  HGB 8.2*  HCT 26.2*  MCV 92.9  PLT 737   Basic Metabolic Panel:  Recent Labs Lab 12/04/16 0003  NA 128*  K 4.3  CL 92*  CO2 24  GLUCOSE 301*  BUN 83*  CREATININE 4.28*  CALCIUM 8.4*   GFR: Estimated Creatinine Clearance: 13.8 mL/min (A) (by C-G formula based on SCr of 4.28 mg/dL (H)). Liver Function Tests: No results for input(s): AST, ALT, ALKPHOS, BILITOT, PROT, ALBUMIN in the last 168 hours. No results for input(s): LIPASE, AMYLASE in the last 168 hours. No results for input(s): AMMONIA in the last 168 hours. Coagulation Profile:  Recent Labs Lab 12/04/16 0003  INR 6.71*   Cardiac Enzymes:  Recent Labs Lab 12/04/16 0004  TROPONINI 7.82*   BNP (last 3 results) No results for input(s): PROBNP in the last 8760 hours. HbA1C: No results for input(s): HGBA1C in the last 72 hours. CBG:  Recent Labs Lab 12/03/16 1303 12/03/16 1516 12/03/16 2022 12/03/16 2156 12/04/16 0811  GLUCAP 244* 269* 311* 286* 283*   Lipid Profile: No results for input(s): CHOL, HDL, LDLCALC, TRIG, CHOLHDL, LDLDIRECT in the last 72 hours. Thyroid Function Tests: No results for input(s): TSH, T4TOTAL, FREET4, T3FREE, THYROIDAB  in the last 72 hours. Anemia Panel: No results for input(s): VITAMINB12, FOLATE, FERRITIN, TIBC, IRON, RETICCTPCT in the last 72 hours. Sepsis Labs: No results for input(s): PROCALCITON, LATICACIDVEN in the last 168 hours.  Recent Results (from the past 240 hour(s))  MRSA PCR Screening     Status: None   Collection Time: 12/03/16 10:41 AM  Result Value Ref Range Status   MRSA by PCR NEGATIVE NEGATIVE Final    Comment:        The GeneXpert MRSA Assay (FDA approved for NASAL specimens only), is one component of a comprehensive MRSA colonization surveillance program. It is not intended to diagnose MRSA infection nor to guide or monitor treatment for MRSA infections.          Radiology Studies: Dg Chest Port 1 View  Result Date: 12/03/2016 CLINICAL DATA:  Shortness of breath. EXAM: PORTABLE CHEST 1 VIEW COMPARISON:  12/01/2016 FINDINGS: Bilateral lower lung opacities and bilateral pleural effusions again noted. Visualized cardiomediastinal silhouette is unchanged. No pneumothorax. There has been little interval change since the prior study. IMPRESSION: Unchanged appearance of the chest with  bilateral lower lung opacities/ atelectasis and bilateral pleural effusions. Electronically Signed   By: Margarette Canada M.D.   On: 12/03/2016 15:09        Scheduled Meds: . ALPRAZolam  0.5 mg Oral QHS  . amiodarone  200 mg Oral BID  . ferrous sulfate  325 mg Oral Q breakfast  . fluticasone  1 spray Each Nare Daily  . insulin aspart  0-5 Units Subcutaneous QHS  . insulin aspart  0-9 Units Subcutaneous TID WC  . insulin glargine  15 Units Subcutaneous QHS  . isosorbide mononitrate  30 mg Oral Daily  . latanoprost  1 drop Both Eyes QHS  . metoprolol succinate  100 mg Oral Q breakfast  . pantoprazole  40 mg Oral Daily  . rosuvastatin  40 mg Oral QHS  . sodium chloride flush  3 mL Intravenous Q12H  . Warfarin - Pharmacist Dosing Inpatient   Does not apply q1800   Continuous Infusions: .  sodium chloride       LOS: 1 day        Aline August, MD [facility] Pager [phone]  If 7PM-7AM, please contact night-coverage [email or web address] Password [email or web address] 12/04/2016, 9:23 AM

## 2016-12-04 NOTE — Consult Note (Signed)
[facility] Renal Consultation Note  Requesting MD: Starla Link Indication for Consultation: acute on chronic renal failure  HPI:  CLAUDE SWENDSEN is a 71 y.o. female with past medical history significant for diabetes mellitus, hypertension, coronary artery disease, A. Fib- new onset, history of embolectomy to upper extremity, multiple hospitalizations usually for congestive heart failure ( noted combined systolic and diastolic- recent EF 09-32 and grade 2 diastolic).  She follows with Dr. Lorrene Reid at [facility] with chronic kidney disease - thought to be due to hypertension/diabetes as well as urologic issues. Seems that creatinine baseline has been in the twos with occasional bumps related to hospitalizations/illnesses.  There is a creatinine in the system from 9/23 that was actually good for her at 2.04- that was on the tail end of a hospitalization at [facility]  from 9/15-9/23 with  new onset A. Fib and heart failure. She was started on Toprol  as well as amiodarone during that hospitalization and she was told to increase her torsemide from 60 twice a day to 80 twice a day. She presented to [facility] n 9/24 in respiratory failure- she admits it may have been anxiety driven. She was found to have an elevated troponin and an elevated INR. Her creatinine also was reportedly 3.6. She was transferred to [facility] last evening. So far has had only 725 of urine output. Creatinine was 4.28 last night and 4.39 today - not currently on a diuretic.  blood pressure is in the 100s to 120s on amiodarone and Toprol- heart rate if anything is low- INR 6.7 and hemoglobin 8.2 ( she has not required ESA as outpatient but has received iron).  Right now she complains of being sleepy due to too many pills. O2 sat in the high 90s on room air but does have edema.   Creat  Date/Time Value Ref Range Status  10/08/2013 10:24 AM 1.73 (H) 0.50 - 1.10 mg/dL Final  09/23/2013 11:24 AM 2.23 (H) 0.50 - 1.10 mg/dL Final    Creatinine, Ser  Date/Time Value Ref Range Status  12/04/2016 10:11 AM 4.39 (H) 0.44 - 1.00 mg/dL Final  12/04/2016 12:03 AM 4.28 (H) 0.44 - 1.00 mg/dL Final  11/27/2016 06:33 AM 2.04 (H) 0.44 - 1.00 mg/dL Final  11/26/2016 07:18 AM 2.17 (H) 0.44 - 1.00 mg/dL Final  11/25/2016 06:15 AM 2.30 (H) 0.44 - 1.00 mg/dL Final  11/24/2016 06:09 AM 2.40 (H) 0.44 - 1.00 mg/dL Final  11/23/2016 05:36 AM 2.24 (H) 0.44 - 1.00 mg/dL Final  11/22/2016 09:34 AM 2.41 (H) 0.44 - 1.00 mg/dL Final  11/21/2016 04:52 AM 2.60 (H) 0.44 - 1.00 mg/dL Final  11/20/2016 10:18 AM 2.80 (H) 0.44 - 1.00 mg/dL Final  11/19/2016 08:15 AM 3.00 (H) 0.44 - 1.00 mg/dL Final  11/16/2016 06:13 AM 2.81 (H) 0.44 - 1.00 mg/dL Final  11/15/2016 06:09 AM 2.87 (H) 0.44 - 1.00 mg/dL Final  11/14/2016 04:09 AM 2.95 (H) 0.44 - 1.00 mg/dL Final  11/13/2016 07:36 AM 2.90 (H) 0.44 - 1.00 mg/dL Final  11/12/2016 07:04 AM 2.79 (H) 0.44 - 1.00 mg/dL Final  11/11/2016 05:30 AM 2.62 (H) 0.44 - 1.00 mg/dL Final  11/10/2016 07:02 PM 2.82 (H) 0.44 - 1.00 mg/dL Final  11/01/2016 07:17 AM 1.68 (H) 0.44 - 1.00 mg/dL Final  10/31/2016 05:27 AM 2.06 (H) 0.44 - 1.00 mg/dL Final  08/08/2016 04:22 AM 1.78 (H) 0.44 - 1.00 mg/dL Final  08/07/2016 03:08 PM 2.00 (H) 0.44 - 1.00 mg/dL Final  08/07/2016 02:29 PM 2.12 (H)  0.44 - 1.00 mg/dL Final  08/07/2016 07:07 AM 2.34 (H) 0.44 - 1.00 mg/dL Final  08/07/2016 03:20 AM 2.38 (H) 0.44 - 1.00 mg/dL Final  07/16/2016 04:56 AM 2.26 (H) 0.44 - 1.00 mg/dL Final  07/15/2016 05:02 AM 2.28 (H) 0.44 - 1.00 mg/dL Final  07/14/2016 06:32 AM 2.28 (H) 0.44 - 1.00 mg/dL Final  07/10/2016 05:21 AM 2.36 (H) 0.44 - 1.00 mg/dL Final  07/09/2016 02:40 PM 2.60 (H) 0.44 - 1.00 mg/dL Final  07/09/2016 04:02 AM 2.64 (H) 0.44 - 1.00 mg/dL Final  04/01/2016 01:12 AM 2.33 (H) 0.44 - 1.00 mg/dL Final  03/20/2016 03:33 PM 2.46 (H) 0.44 - 1.00 mg/dL Final  02/18/2016 09:37 AM 2.77 (H) 0.57 - 1.00 mg/dL Final  01/10/2016 05:29 AM  1.57 (H) 0.44 - 1.00 mg/dL Final  12/03/2015 08:55 AM 1.52 (H) 0.44 - 1.00 mg/dL Final  12/02/2015 04:22 AM 1.72 (H) 0.44 - 1.00 mg/dL Final  12/01/2015 04:20 PM 1.96 (H) 0.44 - 1.00 mg/dL Final  12/01/2015 01:56 AM 2.02 (H) 0.44 - 1.00 mg/dL Final  10/14/2015 04:08 AM 1.49 (H) 0.44 - 1.00 mg/dL Final  10/13/2015 06:16 AM 1.65 (H) 0.44 - 1.00 mg/dL Final  10/12/2015 06:23 AM 1.82 (H) 0.44 - 1.00 mg/dL Final  10/12/2015 06:21 AM 1.83 (H) 0.44 - 1.00 mg/dL Final  10/11/2015 04:51 AM 1.66 (H) 0.44 - 1.00 mg/dL Final  10/10/2015 04:04 AM 2.50 (H) 0.44 - 1.00 mg/dL Final  09/25/2015 10:02 AM 1.87 (H) 0.57 - 1.00 mg/dL Final  07/12/2015 03:35 AM 1.85 (H) 0.44 - 1.00 mg/dL Final  07/12/2015 03:35 AM 1.76 (H) 0.44 - 1.00 mg/dL Final  07/11/2015 02:21 AM 1.78 (H) 0.44 - 1.00 mg/dL Final  07/10/2015 03:00 AM 1.88 (H) 0.44 - 1.00 mg/dL Final  07/10/2015 02:25 AM 1.87 (H) 0.44 - 1.00 mg/dL Final  07/09/2015 11:45 AM 2.26 (H) 0.44 - 1.00 mg/dL Final     PMHx:   Past Medical History:  Diagnosis Date  . Acute renal failure superimposed on stage 4 chronic kidney disease ([place]) 07/04/2015  . Allergic rhinitis   . Anemia   . Anxiety   . CAD in native artery    NSTEMI 07/2015 s/p DES to RCA and posterior PDA, PCI 10/2015 with scoring balloon to 85% ISR of distal RCA), known LAD/Cx disease treated medically  . Carotid artery disease ([place])    Mild bilateral carotid disease (1-39% 07/2015)  . Chronic combined systolic and diastolic CHF (congestive heart failure) ([place])   . CKD (chronic kidney disease), stage IV ([place])   . Constipation   . Essential hypertension   . GERD (gastroesophageal reflux disease)   . History of hysterectomy   . Hyperlipidemia   . Low back pain   . Obesity   . 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 Osteoarthritis   . Overactive bladder   . PVC's (premature ventricular contractions)   . Type 2 diabetes  mellitus ([place])     Past Surgical History:  Procedure Laterality Date  . ABDOMINAL HYSTERECTOMY    . BACK SURGERY     multiple  . CARDIAC CATHETERIZATION  04/04/2006   Est EF of 60%  . CARDIAC CATHETERIZATION N/A 07/04/2015   Procedure: Left Heart Cath and Coronary Angiography;  Surgeon: Troy Sine, MD;  Location: [place] [facility];  Service: Cardiovascular;  Laterality: N/A;  . CARDIAC CATHETERIZATION N/A 07/04/2015   Procedure: Coronary Stent Intervention;  Surgeon:  Troy Sine, MD;  Location: [place];  Service: Cardiovascular;  Laterality: N/A;  . CARDIAC CATHETERIZATION N/A 10/13/2015   Procedure: Left Heart Cath and Coronary Angiography;  Surgeon: Troy Sine, MD;  Location: [place] [facility];  Service: Cardiovascular;  Laterality: N/A;  . CARDIAC CATHETERIZATION N/A 12/02/2015   Procedure: Left Heart Cath and Coronary Angiography;  Surgeon: Peter M Martinique, MD;  Location: [place] [facility];  Service: Cardiovascular;  Laterality: N/A;  . CARPAL TUNNEL RELEASE    . CERVICAL BIOPSY     cervical lymph node biopsies  . COLONOSCOPY  May 2002   Dr. Irving Shows :Followup in 5 years, normal exam  . COLONOSCOPY  2008   Dr. Laural Golden: Very redundant colon with mild melanosis coli, splenic flexure polyp biopsy with acute complaint of benign colon polyp. Recommended ten-year followup  . CORONARY STENT PLACEMENT  04/11/2006   2 -- Taxus stents to the circumflex   . PERIPHERAL VASCULAR CATHETERIZATION Right 07/09/2015   Procedure: Upper Extremity Angiography;  Surgeon: Conrad Whigham, MD;  Location: [place] [facility];  Service: Cardiovascular;  Laterality: Right;  . PERIPHERAL VASCULAR CATHETERIZATION Right 07/10/2015   Procedure: RIGHT SUBCLAVIAN ARTERY THROMBECTOMY;  Surgeon: Serafina Mitchell, MD;  Location: [facility];  Service: Vascular;  Laterality: Right;  . Tendonitis     bilateral elbow  . TRIGGER FINGER RELEASE      Family Hx:  Family History  Problem Relation Age of Onset   . Diabetes Father   . Hypertension Father   . Stroke Father   . Hypertension Brother   . Aneurysm Brother   . Diabetes Brother   . Colon cancer Neg Hx     Social History:  reports that she has never smoked. She has never used smokeless tobacco. She reports that she does not drink alcohol or use drugs.  Allergies:  Allergies  Allergen Reactions  . Ace Inhibitors Cough  . Amlodipine Other (See Comments)    Edema   . Codeine Rash    Medications: Prior to Admission medications   Medication Sig Start Date End Date Taking? Authorizing Provider  acetaminophen (TYLENOL) 500 MG tablet Take 2 tablets (1,000 mg total) by mouth every 6 (six) hours as needed (pain). 07/12/15  Yes Lily Kocher, MD  albuterol (PROVENTIL HFA;VENTOLIN HFA) 108 (90 Base) MCG/ACT inhaler Inhale 1-2 puffs into the lungs every 6 (six) hours as needed for wheezing or shortness of breath. 04/01/16  Yes Nanavati, Ankit, MD  albuterol (PROVENTIL) (2.5 MG/3ML) 0.083% nebulizer solution Take 3 mLs (2.5 mg total) by nebulization every 6 (six) hours as needed for wheezing or shortness of breath. 07/16/16  Yes Elgergawy, Silver Huguenin, MD  ALPRAZolam Duanne Moron) 0.5 MG tablet Take 0.5 mg by mouth at bedtime.   Yes [provider]  amiodarone (PACERONE) 200 MG tablet Take 1 tablet (200 mg total) by mouth 2 (two) times daily. New medication to control your heart rate. 11/27/16  Yes Rexene Alberts, MD  cetirizine (ZYRTEC) 10 MG tablet Take 5 mg by mouth daily. 10/24/16  Yes [provider]  diazepam (VALIUM) 5 MG tablet Take 1 tablet (5 mg total) by mouth every 12 (twelve) hours as needed for muscle spasms. 10/29/16  Yes Julianne Rice, MD  ferrous sulfate 325 (65 FE) MG tablet Take 325 mg by mouth daily with breakfast.    Yes [provider]  fluticasone (FLONASE) 50 MCG/ACT nasal spray Place 1 spray into both nostrils daily.  Yes [provider]  hydrALAZINE (APRESOLINE) 100 MG tablet Take 0.5 tablets (50  mg total) by mouth 2 (two) times daily. 11/27/16  Yes Rexene Alberts, MD  insulin lispro (HUMALOG KWIKPEN) 100 UNIT/ML KiwkPen INJECT 5 TO 20 TOTAL INTO THE SKIN THREE TIMES DAILY Patient taking differently: Inject 15-21 Units into the skin 3 (three) times daily.  11/27/16  Yes Rexene Alberts, MD  isosorbide mononitrate (IMDUR) 60 MG 24 hr tablet Take 0.5 tablets (30 mg total) by mouth daily. 11/27/16  Yes Rexene Alberts, MD  latanoprost (XALATAN) 0.005 % ophthalmic solution Place 1 drop into both eyes at bedtime.    Yes [provider]  meclizine (ANTIVERT) 25 MG tablet Take 12.5 mg by mouth 2 (two) times daily.    Yes [provider]  metoprolol succinate (TOPROL-XL) 100 MG 24 hr tablet Take 1 tablet (100 mg total) by mouth daily with breakfast. Take with or immediately following a meal. 11/28/16  Yes Rexene Alberts, MD  Multiple Vitamin (MULTIVITAMIN WITH MINERALS) TABS tablet Take 1 tablet by mouth daily.   Yes [provider]  nitroGLYCERIN (NITROSTAT) 0.4 MG SL tablet Place 1 tablet (0.4 mg total) under the tongue every 5 (five) minutes as needed for chest pain. 10/14/15  Yes Simmons, Brittainy M, PA-C  pantoprazole (PROTONIX) 40 MG tablet Take 40 mg by mouth daily.    Yes [provider]  rosuvastatin (CRESTOR) 40 MG tablet Take 1 tablet (40 mg total) by mouth at bedtime. 07/12/15  Yes Lily Kocher, MD  ticagrelor (BRILINTA) 60 MG TABS tablet Take 1 tablet (60 mg total) by mouth 2 (two) times daily. 11/16/16  Yes Reyne Dumas, MD  torsemide (DEMADEX) 20 MG tablet Take 4 tablets (80 mg total) by mouth 2 (two) times daily. Pt is able to take an additional tablet daily for weight gain greater than 3lbs. Patient taking differently: Take 80-100 mg by mouth See admin instructions. Take 4 tablets (80 mg) by mouth twice daily - may take an additional tablet (20 mg) daily for weight gain greater than 3lbs. 11/27/16  Yes Rexene Alberts, MD  trimethoprim (TRIMPEX) 100 MG tablet  Take 100 mg by mouth daily.   Yes [provider]  trolamine salicylate (ASPERCREME) 10 % cream Apply 1 application topically as needed for muscle pain.   Yes [provider]  warfarin (COUMADIN) 7.5 MG tablet Your dosing has been changed to one tablet alternating with a half a tablet daily. Starting on the evening of Monday 11/28/16, take 1 tablet, then on Tuesday 9/25 take a half a tablet, then follow the pattern of one tablet alternating with a half a tablet daily. Patient taking differently: Take 3.75-7.5 mg by mouth See admin instructions. Your dosing has been changed to one tablet alternating with a half a tablet daily. Starting on the evening of Monday 11/28/16, take 1 tablet (7.5 mg), then on Tuesday 9/25 take a half a tablet (3.75 mg), then follow the pattern of one tablet alternating with a half a tablet daily at night 11/27/16  Yes Rexene Alberts, MD  cefUROXime (CEFTIN) 500 MG tablet Take 500 mg by mouth daily with breakfast. 5 day course filled 11/16/16 11/16/16   [provider]  cyclobenzaprine (FLEXERIL) 5 MG tablet Take 5 mg by mouth at bedtime. 10/03/16   [provider]  insulin glargine (LANTUS) 100 UNIT/ML injection Inject 0.3 mLs (30 Units total) into the skin at bedtime. 11/27/16   Rexene Alberts, MD  potassium chloride SA (K-DUR,KLOR-CON)  20 MEQ tablet Take 2 tablets (40 mEq total) by mouth daily. Patient not taking: Reported on 12/03/2016 10/14/15   Consuelo Pandy, PA-C    I have reviewed the patient's current medications.  Labs:  Results for orders placed or performed during the hospital encounter of 12/03/16 (from the past 48 hour(s))  MRSA PCR Screening     Status: None   Collection Time: 12/03/16 10:41 AM  Result Value Ref Range   MRSA by PCR NEGATIVE NEGATIVE    Comment:        The GeneXpert MRSA Assay (FDA approved for NASAL specimens only), is one component of a comprehensive MRSA colonization surveillance program. It is  not intended to diagnose MRSA infection nor to guide or monitor treatment for MRSA infections.   Glucose, capillary     Status: Abnormal   Collection Time: 12/03/16  1:03 PM  Result Value Ref Range   Glucose-Capillary 244 (H) 65 - 99 mg/dL  Glucose, capillary     Status: Abnormal   Collection Time: 12/03/16  3:16 PM  Result Value Ref Range   Glucose-Capillary 269 (H) 65 - 99 mg/dL  Glucose, capillary     Status: Abnormal   Collection Time: 12/03/16  8:22 PM  Result Value Ref Range   Glucose-Capillary 311 (H) 65 - 99 mg/dL  Glucose, capillary     Status: Abnormal   Collection Time: 12/03/16  9:56 PM  Result Value Ref Range   Glucose-Capillary 286 (H) 65 - 99 mg/dL  Basic metabolic panel     Status: Abnormal   Collection Time: 12/04/16 12:03 AM  Result Value Ref Range   Sodium 128 (L) 135 - 145 mmol/L   Potassium 4.3 3.5 - 5.1 mmol/L   Chloride 92 (L) 101 - 111 mmol/L   CO2 24 22 - 32 mmol/L   Glucose, Bld 301 (H) 65 - 99 mg/dL   BUN 83 (H) 6 - 20 mg/dL   Creatinine, Ser 4.28 (H) 0.44 - 1.00 mg/dL   Calcium 8.4 (L) 8.9 - 10.3 mg/dL   GFR calc non Af Amer 9 (L) >60 mL/min   GFR calc Af Amer 11 (L) >60 mL/min    Comment: (NOTE) The eGFR has been calculated using the CKD EPI equation. This calculation has not been validated in all clinical situations. eGFR's persistently <60 mL/min signify possible Chronic Kidney Disease.    Anion gap 12 5 - 15  CBC     Status: Abnormal   Collection Time: 12/04/16 12:03 AM  Result Value Ref Range   WBC 7.0 4.0 - 10.5 K/uL   RBC 2.82 (L) 3.87 - 5.11 MIL/uL   Hemoglobin 8.2 (L) 12.0 - 15.0 g/dL   HCT 26.2 (L) 36.0 - 46.0 %   MCV 92.9 78.0 - 100.0 fL   MCH 29.1 26.0 - 34.0 pg   MCHC 31.3 30.0 - 36.0 g/dL   RDW 17.4 (H) 11.5 - 15.5 %   Platelets 269 150 - 400 K/uL  Protime-INR     Status: Abnormal   Collection Time: 12/04/16 12:03 AM  Result Value Ref Range   Prothrombin Time 57.9 (H) 11.4 - 15.2 seconds    Comment: REPEATED TO VERIFY    INR 6.71 (HH)     Comment: SPECIMEN CHECKED FOR CLOTS REPEATED TO VERIFY CRITICAL RESULT CALLED TO, READ BACK BY AND VERIFIED WITH: PETTIFORD A RN AT 4854 ON 09.30.2018 BY COCHRANE S   Troponin I     Status: Abnormal   Collection  Time: 12/04/16 12:04 AM  Result Value Ref Range   Troponin I 7.82 (HH) <0.03 ng/mL    Comment: CRITICAL RESULT CALLED TO, READ BACK BY AND VERIFIED WITH: MCKIBBEN K,RN 12/04/16 0107 WAYK   Glucose, capillary     Status: Abnormal   Collection Time: 12/04/16  8:11 AM  Result Value Ref Range   Glucose-Capillary 283 (H) 65 - 99 mg/dL  Comprehensive metabolic panel     Status: Abnormal   Collection Time: 12/04/16 10:11 AM  Result Value Ref Range   Sodium 128 (L) 135 - 145 mmol/L   Potassium 4.4 3.5 - 5.1 mmol/L   Chloride 92 (L) 101 - 111 mmol/L   CO2 23 22 - 32 mmol/L   Glucose, Bld 289 (H) 65 - 99 mg/dL   BUN 87 (H) 6 - 20 mg/dL   Creatinine, Ser 4.39 (H) 0.44 - 1.00 mg/dL   Calcium 8.3 (L) 8.9 - 10.3 mg/dL   Total Protein 6.6 6.5 - 8.1 g/dL   Albumin 2.5 (L) 3.5 - 5.0 g/dL   AST 51 (H) 15 - 41 U/L   ALT 40 14 - 54 U/L   Alkaline Phosphatase 125 38 - 126 U/L   Total Bilirubin 0.7 0.3 - 1.2 mg/dL   GFR calc non Af Amer 9 (L) >60 mL/min   GFR calc Af Amer 11 (L) >60 mL/min    Comment: (NOTE) The eGFR has been calculated using the CKD EPI equation. This calculation has not been validated in all clinical situations. eGFR's persistently <60 mL/min signify possible Chronic Kidney Disease.    Anion gap 13 5 - 15  Troponin I     Status: Abnormal   Collection Time: 12/04/16 10:11 AM  Result Value Ref Range   Troponin I 5.78 (HH) <0.03 ng/mL    Comment: CRITICAL VALUE NOTED.  VALUE IS CONSISTENT WITH PREVIOUSLY REPORTED AND CALLED VALUE.  Glucose, capillary     Status: Abnormal   Collection Time: 12/04/16  1:00 PM  Result Value Ref Range   Glucose-Capillary 268 (H) 65 - 99 mg/dL     ROS:  A comprehensive review of systems was negative except  for: Constitutional: positive for feeling sleepy and having edema  Physical Exam: Vitals:   12/04/16 0753 12/04/16 0911  BP:  106/62  Pulse: 65 77  Resp:    Temp: 98.2 F (36.8 C)   SpO2:       General: slightly somnolent. However, able to arouse and answer questions and can give me a fairly good history HEENT: pupils are equal round react to light, extra motions are intact, mucous members are moist Neck: no particular JVD Heart: most recent heart rate in the 50s Lungs: poor effort and decreased breath sounds at the bases Abdomen: soft, nontender, nondistended Extremities: 1+ pitting edema Skin: warm and dry Neuro: somnolent but arousable.  is oriented  Assessment/Plan: 71 year old black female with multiple medical issues most notably chronic kidney disease with baseline creatinine in the twos as well as systolic and diastolic heart failure with A. fib 1.Renal- patient with baseline CKD and creatinine in the twos. Creatinine was on the good side of her baseline at 2.0 on 9/23- but medications were manipulated to include a large dose of Toprol and amiodarone. She now has acute on chronic renal failure.  I suspect her most recent acute on chronic renal failure is hemodynamically mediated from low blood pressure and low heart rate. I've written to decrease her Toprol.  2. Hypertension/volume  -  blood pressure mostly lowish well as bradycardia and is probably too low for her.  I have written to decrease her dose of Toprol but she already received it today. The anxiety/sleep medicines are not helping. She is volume overloaded. However, right now with her borderline low pressure and the fact that she has good oxygen saturation on room air I do not want to push it with a diuretic 3. Anemia  - I'm not sure it could be contributing to her shortness of breath. I will check iron stores and also add an ESA 4. Elevated INR- per primary being corrected 5. Positive troponin- complicating factor-  cardiology following and hoping it is demand ischemia   Shenika Quint A 12/04/2016, 2:13 PM

## 2016-12-04 NOTE — Progress Notes (Signed)
CRITICAL VALUE ALERT  Critical Value:  Troponin 7.82  Date & Time Notied:  12/04/2016  0130  Provider Notified: Dr.Qureshi Orders Received/Actions taken: No orders at this time

## 2016-12-04 NOTE — Progress Notes (Signed)
CRITICAL VALUE ALERT  Critical Value:  INR 6.71  Date & Time Notied:  12/04/2016 0131  Provider Notified: Dr.Opyd  Orders Received/Actions taken: No new orders, this is better than previous.

## 2016-12-04 NOTE — Progress Notes (Signed)
Progress Note  Patient Name: [name] Date of Encounter: 12/04/2016   Subjective   SOB improving.   Inpatient Medications    Scheduled Meds: . ALPRAZolam  0.5 mg Oral QHS  . amiodarone  200 mg Oral BID  . ferrous sulfate  325 mg Oral Q breakfast  . fluticasone  1 spray Each Nare Daily  . insulin aspart  0-5 Units Subcutaneous QHS  . insulin aspart  0-9 Units Subcutaneous TID WC  . insulin glargine  15 Units Subcutaneous QHS  . isosorbide mononitrate  30 mg Oral Daily  . latanoprost  1 drop Both Eyes QHS  . metoprolol succinate  100 mg Oral Q breakfast  . pantoprazole  40 mg Oral Daily  . rosuvastatin  40 mg Oral QHS  . sodium chloride flush  3 mL Intravenous Q12H  . Warfarin - Pharmacist Dosing Inpatient   Does not apply q1800   Continuous Infusions: . sodium chloride     PRN Meds: sodium chloride, acetaminophen, albuterol, diazepam, ondansetron (ZOFRAN) IV, sodium chloride flush   Vital Signs    Vitals:   12/03/16 2324 12/04/16 0501 12/04/16 0753 12/04/16 0911  BP: 121/62 122/63  106/62  Pulse: 78 82 65 77  Resp: (!) 22 (!) 37    Temp: 98.1 F (36.7 C) 97.9 F (36.6 C) 98.2 F (36.8 C)   TempSrc: Oral Oral Oral   SpO2: 94% 94%    Weight:  194 lb (88 kg)    Height:        Intake/Output Summary (Last 24 hours) at 12/04/16 1209 Last data filed at 12/04/16 1010  Gross per 24 hour  Intake              603 ml  Output              725 ml  Net             -122 ml   Filed Weights   12/03/16 1032 12/04/16 0501  Weight: 205 lb 11 oz (93.3 kg) 194 lb (88 kg)    Telemetry    SR - Personally Reviewed  ECG    n/a  Physical Exam   GEN: No acute distress.   Neck: No JVD Cardiac: RRR, no murmurs, rubs, or gallops.  Respiratory: Clear to auscultation bilaterally. GI: Soft, nontender, non-distended  MS: trace bilateral edema; No deformity. Neuro:  Nonfocal  Psych: Normal affect   Labs    Chemistry Recent Labs Lab 12/04/16 0003  12/04/16 1011  NA 128* 128*  K 4.3 4.4  CL 92* 92*  CO2 24 23  GLUCOSE 301* 289*  BUN 83* 87*  CREATININE 4.28* 4.39*  CALCIUM 8.4* 8.3*  PROT  --  6.6  ALBUMIN  --  2.5*  AST  --  51*  ALT  --  40  ALKPHOS  --  125  BILITOT  --  0.7  GFRNONAA 9* 9*  GFRAA 11* 11*  ANIONGAP 12 13     Hematology Recent Labs Lab 12/04/16 0003  WBC 7.0  RBC 2.82*  HGB 8.2*  HCT 26.2*  MCV 92.9  MCH 29.1  MCHC 31.3  RDW 17.4*  PLT 269    Cardiac Enzymes Recent Labs Lab 12/04/16 0004 12/04/16 1011  TROPONINI 7.82* 5.78*   No results for input(s): TROPIPOC in the last 168 hours.   BNPNo results for input(s): BNP, PROBNP in the last 168 hours.   DDimer No results for input(s): DDIMER in  the last 168 hours.   Radiology    Dg Chest Port 1 View  Result Date: 12/03/2016 CLINICAL DATA:  Shortness of breath. EXAM: PORTABLE CHEST 1 VIEW COMPARISON:  12/01/2016 FINDINGS: Bilateral lower lung opacities and bilateral pleural effusions again noted. Visualized cardiomediastinal silhouette is unchanged. No pneumothorax. There has been little interval change since the prior study. IMPRESSION: Unchanged appearance of the chest with bilateral lower lung opacities/ atelectasis and bilateral pleural effusions. Electronically Signed   By: Margarette Canada M.D.   On: 12/03/2016 15:09    Cardiac Studies   [facility] Lab 12/03/16: Hgb 8.2, Plt 288, INR 15, Cr 2.3 to 3.6, Trop 2.4 [facility] CXR: left pleural effusion with left base consolidation, pulm congestion 11/2016 nuclear stress: large inferolatearl scare, LVEF 22%.  11/2015 cath: patent RCA stents, mod LAD disease, LCX CTO. Recs for medical thearpy.  11/2016 LVEF 30-35%, grade II diastolic dysfunciton  Patient Profile     [name] is a 71 y.o.female history of CAD with NSTEMI 06/2015 with DES to PDA and RCA at that time. She had a subtotal occlusion of LCX that was treated medically, with recs if viability shown could consider intervention. Repeat  cath 9/201 with stable disease. Chronic systolic HF LVEF 92-11%, grade II diastolic dysfunction, right subclavian stenosis s/p intervention, DM2, HTN, HL, CKD IV admitted with SOB and fluid overload initially to [facility].   Assessment & Plan    1.. Acute on chronic systolic HF - 11/4172 echo LVEF 30-35% - no ACEI,ARB/ARNI/aldactone due to poor renal funciton - admit weight today 205 lbs, similar to discharge weight 11/27/16. She was diuresed at [facility] prior to transfer.  - Cr at [facility] on day of transfer up to 3.6. Repeat labs here 4.28 yesterday, and 4.39 today. Hold diuretics at this time. Still with some LE edema but volume status appears improved based on initial desciption when she presented to [facility].  2. CAD - history of CAD, NSTEMI 06/2015. DES to PDA and RCA at that time. She had a subtotal occlusion of LCX that was treated medically, with recs if viability shown could consider intervention. Repeat cath 11/2015 with stable disease. She also had a nuclear stress earlier this month with scar and no ischemia - peak trop 7.82 here, I suspect demand ischemia in the setting of chronic obstructive CAD.  - she denies any chest pain  3. Hypoxic/hypercapneic respiratory failure - initial presentation to [facility], initial gast 7.17/89, started on bipap  4. PAF - admit earlier 11/2016 with afib with RVR - on amio started 11/23/16 during admission, CHADS2Vasc score is 5 she is on coumadin - INR reported at 15 at OSH, coumadin on hold. INR here 6.7, continue to hold coumadin - she is in SR this AM  5. AKI - per primary team, hold diuretics.     For questions or updates, please contact [place] Please consult [email or web address] for contact info under Cardiology/STEMI.      Merrily Pew, MD  12/04/2016, 12:09 PM

## 2016-12-04 NOTE — Progress Notes (Signed)
[place] for warfarin Indication: atrial fibrillation  Allergies  Allergen Reactions  . Ace Inhibitors Cough  . Amlodipine Other (See Comments)    Edema   . Codeine Rash    Patient Measurements: Height: 5\' 8"  (172.7 cm) Weight: 194 lb (88 kg) IBW/kg (Calculated) : 63.9  Vital Signs: Temp: 98.2 F (36.8 C) (09/30 0753) Temp Source: Oral (09/30 0753) BP: 106/62 (09/30 0911) Pulse Rate: 77 (09/30 0911)  Labs:  Recent Labs  12/04/16 0003 12/04/16 0004 12/04/16 1011  HGB 8.2*  --   --   HCT 26.2*  --   --   PLT 269  --   --   LABPROT 57.9*  --   --   INR 6.71*  --   --   CREATININE 4.28*  --  4.39*  TROPONINI  --  7.82* 5.78*    Estimated Creatinine Clearance: 13.4 mL/min (A) (by C-G formula based on SCr of 4.39 mg/dL (H)).   Assessment: 71 YOF transferred from [facility] on 9/29. Per [facility] chart, patient was on warfarin 7.5mg  every other day PTA.   At [facility]: 9/25- warfarin 2.5mg  given, no INR in chart 9/26- warfarin 7.5mg  given, no INR in chart 9/27- INR 9.1, no warfarin given 9/28- clindamycin 600mg  IV q6h started, no INR in chart 9/29- INR 15.2. Vitamin K 2.5mg  po x1 given. Clindamycin continued   INR this morning 6.7. Hgb 8.2, plts 269- no bleeding noted.  Goal of Therapy:  INR 2-3 Monitor platelets by anticoagulation protocol: Yes   Plan:  No warfarin tonight Daily INR Follow for s/s bleeding  Vieva Brummitt D. Kayven Aldaco, PharmD, BCPS Clinical Pharmacist Pager: [phone] Clinical Phone for 12/04/2016 until 3:30pm: [phone] If after 3:30pm, please call main pharmacy at [phone] 12/04/2016 11:14 AM

## 2016-12-05 ENCOUNTER — Ambulatory Visit (INDEPENDENT_AMBULATORY_CARE_PROVIDER_SITE_OTHER): Payer: BLUE CROSS/BLUE SHIELD | Admitting: Otolaryngology

## 2016-12-05 ENCOUNTER — Inpatient Hospital Stay (HOSPITAL_COMMUNITY): Payer: BLUE CROSS/BLUE SHIELD

## 2016-12-05 DIAGNOSIS — I255 Ischemic cardiomyopathy: Secondary | ICD-10-CM | POA: Diagnosis present

## 2016-12-05 DIAGNOSIS — I214 Non-ST elevation (NSTEMI) myocardial infarction: Secondary | ICD-10-CM | POA: Diagnosis not present

## 2016-12-05 DIAGNOSIS — Z7901 Long term (current) use of anticoagulants: Secondary | ICD-10-CM | POA: Diagnosis not present

## 2016-12-05 DIAGNOSIS — E1122 Type 2 diabetes mellitus with diabetic chronic kidney disease: Secondary | ICD-10-CM | POA: Diagnosis present

## 2016-12-05 DIAGNOSIS — I2510 Atherosclerotic heart disease of native coronary artery without angina pectoris: Secondary | ICD-10-CM | POA: Diagnosis present

## 2016-12-05 DIAGNOSIS — D631 Anemia in chronic kidney disease: Secondary | ICD-10-CM | POA: Diagnosis not present

## 2016-12-05 DIAGNOSIS — I4891 Unspecified atrial fibrillation: Secondary | ICD-10-CM | POA: Diagnosis not present

## 2016-12-05 DIAGNOSIS — E785 Hyperlipidemia, unspecified: Secondary | ICD-10-CM | POA: Diagnosis present

## 2016-12-05 DIAGNOSIS — N183 Chronic kidney disease, stage 3 (moderate): Secondary | ICD-10-CM | POA: Diagnosis present

## 2016-12-05 DIAGNOSIS — J90 Pleural effusion, not elsewhere classified: Secondary | ICD-10-CM | POA: Diagnosis not present

## 2016-12-05 DIAGNOSIS — Z79899 Other long term (current) drug therapy: Secondary | ICD-10-CM | POA: Diagnosis not present

## 2016-12-05 DIAGNOSIS — F419 Anxiety disorder, unspecified: Secondary | ICD-10-CM | POA: Diagnosis present

## 2016-12-05 DIAGNOSIS — I10 Essential (primary) hypertension: Secondary | ICD-10-CM | POA: Diagnosis not present

## 2016-12-05 DIAGNOSIS — Z794 Long term (current) use of insulin: Secondary | ICD-10-CM | POA: Diagnosis not present

## 2016-12-05 DIAGNOSIS — E1151 Type 2 diabetes mellitus with diabetic peripheral angiopathy without gangrene: Secondary | ICD-10-CM | POA: Diagnosis present

## 2016-12-05 DIAGNOSIS — E876 Hypokalemia: Secondary | ICD-10-CM | POA: Diagnosis not present

## 2016-12-05 DIAGNOSIS — Z515 Encounter for palliative care: Secondary | ICD-10-CM | POA: Diagnosis not present

## 2016-12-05 DIAGNOSIS — Z7189 Other specified counseling: Secondary | ICD-10-CM | POA: Diagnosis not present

## 2016-12-05 DIAGNOSIS — I6523 Occlusion and stenosis of bilateral carotid arteries: Secondary | ICD-10-CM | POA: Diagnosis present

## 2016-12-05 DIAGNOSIS — Z955 Presence of coronary angioplasty implant and graft: Secondary | ICD-10-CM | POA: Diagnosis not present

## 2016-12-05 DIAGNOSIS — I25118 Atherosclerotic heart disease of native coronary artery with other forms of angina pectoris: Secondary | ICD-10-CM | POA: Diagnosis not present

## 2016-12-05 DIAGNOSIS — I130 Hypertensive heart and chronic kidney disease with heart failure and stage 1 through stage 4 chronic kidney disease, or unspecified chronic kidney disease: Secondary | ICD-10-CM | POA: Diagnosis present

## 2016-12-05 DIAGNOSIS — I480 Paroxysmal atrial fibrillation: Secondary | ICD-10-CM | POA: Diagnosis not present

## 2016-12-05 DIAGNOSIS — E669 Obesity, unspecified: Secondary | ICD-10-CM | POA: Diagnosis present

## 2016-12-05 DIAGNOSIS — N179 Acute kidney failure, unspecified: Secondary | ICD-10-CM | POA: Diagnosis not present

## 2016-12-05 DIAGNOSIS — N184 Chronic kidney disease, stage 4 (severe): Secondary | ICD-10-CM | POA: Diagnosis not present

## 2016-12-05 DIAGNOSIS — N170 Acute kidney failure with tubular necrosis: Secondary | ICD-10-CM | POA: Diagnosis not present

## 2016-12-05 DIAGNOSIS — J9601 Acute respiratory failure with hypoxia: Secondary | ICD-10-CM | POA: Diagnosis not present

## 2016-12-05 DIAGNOSIS — I252 Old myocardial infarction: Secondary | ICD-10-CM | POA: Diagnosis not present

## 2016-12-05 DIAGNOSIS — I25119 Atherosclerotic heart disease of native coronary artery with unspecified angina pectoris: Secondary | ICD-10-CM | POA: Diagnosis not present

## 2016-12-05 DIAGNOSIS — E1159 Type 2 diabetes mellitus with other circulatory complications: Secondary | ICD-10-CM | POA: Diagnosis not present

## 2016-12-05 DIAGNOSIS — N3281 Overactive bladder: Secondary | ICD-10-CM | POA: Diagnosis present

## 2016-12-05 DIAGNOSIS — I5043 Acute on chronic combined systolic (congestive) and diastolic (congestive) heart failure: Secondary | ICD-10-CM | POA: Diagnosis not present

## 2016-12-05 DIAGNOSIS — K219 Gastro-esophageal reflux disease without esophagitis: Secondary | ICD-10-CM | POA: Diagnosis present

## 2016-12-05 DIAGNOSIS — R748 Abnormal levels of other serum enzymes: Secondary | ICD-10-CM | POA: Diagnosis not present

## 2016-12-05 LAB — GLUCOSE, CAPILLARY
GLUCOSE-CAPILLARY: 274 mg/dL — AB (ref 65–99)
Glucose-Capillary: 151 mg/dL — ABNORMAL HIGH (ref 65–99)
Glucose-Capillary: 182 mg/dL — ABNORMAL HIGH (ref 65–99)
Glucose-Capillary: 176 mg/dL — ABNORMAL HIGH (ref 65–99)

## 2016-12-05 LAB — CBC WITH DIFFERENTIAL/PLATELET
BASOS PCT: 0 %
Basophils Absolute: 0 10*3/uL (ref 0.0–0.1)
EOS ABS: 0.1 10*3/uL (ref 0.0–0.7)
EOS PCT: 1 %
HCT: 24.5 % — ABNORMAL LOW (ref 36.0–46.0)
Hemoglobin: 7.7 g/dL — ABNORMAL LOW (ref 12.0–15.0)
LYMPHS ABS: 0.7 10*3/uL (ref 0.7–4.0)
Lymphocytes Relative: 14 %
MCH: 29.2 pg (ref 26.0–34.0)
MCHC: 31.4 g/dL (ref 30.0–36.0)
MCV: 92.8 fL (ref 78.0–100.0)
Monocytes Absolute: 0.5 10*3/uL (ref 0.1–1.0)
Monocytes Relative: 10 %
Neutro Abs: 3.7 10*3/uL (ref 1.7–7.7)
Neutrophils Relative %: 75 %
PLATELETS: 283 10*3/uL (ref 150–400)
RBC: 2.64 MIL/uL — AB (ref 3.87–5.11)
RDW: 18.1 % — ABNORMAL HIGH (ref 11.5–15.5)
WBC: 5 10*3/uL (ref 4.0–10.5)

## 2016-12-05 LAB — BASIC METABOLIC PANEL
ANION GAP: 9 (ref 5–15)
BUN: 94 mg/dL — ABNORMAL HIGH (ref 6–20)
CALCIUM: 8.4 mg/dL — AB (ref 8.9–10.3)
CO2: 29 mmol/L (ref 22–32)
Chloride: 93 mmol/L — ABNORMAL LOW (ref 101–111)
Creatinine, Ser: 4.75 mg/dL — ABNORMAL HIGH (ref 0.44–1.00)
GFR, EST AFRICAN AMERICAN: 10 mL/min — AB (ref >60)
GFR, EST NON AFRICAN AMERICAN: 8 mL/min — AB (ref >60)
GLUCOSE: 172 mg/dL — AB (ref 65–99)
Potassium: 4.4 mmol/L (ref 3.5–5.1)
SODIUM: 131 mmol/L — AB (ref 135–145)

## 2016-12-05 LAB — MAGNESIUM: MAGNESIUM: 2.7 mg/dL — AB (ref 1.7–2.4)

## 2016-12-05 LAB — PROTIME-INR
INR: 4.51
PROTHROMBIN TIME: 42.5 s — AB (ref 11.4–15.2)

## 2016-12-05 MED ORDER — METOPROLOL SUCCINATE ER 25 MG PO TB24
25.0000 mg | ORAL_TABLET | Freq: Every day | ORAL | Status: DC
  Administered 2016-12-06 – 2016-12-12 (×7): 25 mg via ORAL
  Filled 2016-12-05 (×7): qty 1

## 2016-12-05 MED ORDER — ROSUVASTATIN CALCIUM 10 MG PO TABS
10.0000 mg | ORAL_TABLET | Freq: Every day | ORAL | Status: DC
  Administered 2016-12-05 – 2016-12-11 (×7): 10 mg via ORAL
  Filled 2016-12-05 (×7): qty 1

## 2016-12-05 MED ORDER — POLYETHYLENE GLYCOL 3350 17 G PO PACK
17.0000 g | PACK | Freq: Every day | ORAL | Status: DC | PRN
  Administered 2016-12-05 (×2): 17 g via ORAL
  Filled 2016-12-05 (×2): qty 1

## 2016-12-05 NOTE — Progress Notes (Signed)
Inpatient Diabetes Program Recommendations  AACE/ADA: New Consensus Statement on Inpatient Glycemic Control (2015)  Target Ranges:  Prepandial:   less than 140 mg/dL      Peak postprandial:   less than 180 mg/dL (1-2 hours)      Critically ill patients:  140 - 180 mg/dL   Results for [name], [name] (MRN [id number]) as of 12/05/2016 11:16  Ref. Range 12/04/2016 08:11 12/04/2016 13:00 12/04/2016 15:51 12/04/2016 20:40 12/05/2016 07:57  Glucose-Capillary Latest Ref Range: 65 - 99 mg/dL 283 (H) 268 (H) 284 (H) 306 (H) 151 (H)    Review of Glycemic Control  Diabetes history: DM2 Outpatient Diabetes medications: Lantus 30 units QHS, Humalog 15-21 units TID with meals Current orders for Inpatient glycemic control: Lantus 15 units QHS, Novolog 0-9 units TID with meals, Novolog 0-5 units QHS  Inpatient Diabetes Program Recommendations: Insulin - Meal Coverage: Please consider ordering Novolog 4 units TID with meals for meal coverage if patient eats at least 50% of meals.  Thanks, Barnie Alderman, RN, MSN, CDE Diabetes Coordinator Inpatient Diabetes Program [phone] (Team Pager from 8am to 5pm)

## 2016-12-05 NOTE — Progress Notes (Signed)
Progress Note  Patient Name: [name] Date of Encounter: 12/05/2016  Primary Cardiologist: Bronson Ing  Subjective   No angina, dyspnea better. Doubt in/out records are accurate. If they are, she is oliguric and has poor PO intake. Weight reportedly up 12 lb since yesterday, after dropping 11 lb from the day before. Creatinine is worse. BNP is 2200, much higher than baseline in 300s. Troponin trending down.  Inpatient Medications    Scheduled Meds: . ALPRAZolam  0.5 mg Oral QHS  . amiodarone  200 mg Oral BID  . Chlorhexidine Gluconate Cloth  6 each Topical Daily  . darbepoetin (ARANESP) injection - NON-DIALYSIS  100 mcg Subcutaneous Q Sun-1800  . ferrous sulfate  325 mg Oral Q breakfast  . fluticasone  1 spray Each Nare Daily  . insulin aspart  0-5 Units Subcutaneous QHS  . insulin aspart  0-9 Units Subcutaneous TID WC  . insulin glargine  15 Units Subcutaneous QHS  . isosorbide mononitrate  30 mg Oral Daily  . latanoprost  1 drop Both Eyes QHS  . metoprolol succinate  50 mg Oral Q breakfast  . pantoprazole  40 mg Oral Daily  . rosuvastatin  40 mg Oral QHS  . sodium chloride flush  10-40 mL Intracatheter Q12H  . sodium chloride flush  3 mL Intravenous Q12H  . Warfarin - Pharmacist Dosing Inpatient   Does not apply q1800   Continuous Infusions: . sodium chloride     PRN Meds: sodium chloride, acetaminophen, albuterol, ondansetron (ZOFRAN) IV, polyethylene glycol, sodium chloride flush, sodium chloride flush   Vital Signs    Vitals:   12/05/16 0700 12/05/16 0759 12/05/16 0859 12/05/16 0900  BP: 131/85 113/66 125/62 125/62  Pulse: (!) 56 (!) 57 (!) 59 (!) 56  Resp: 20 19  18   Temp:  98.1 F (36.7 C)  98.1 F (36.7 C)  TempSrc:  Oral  Oral  SpO2: 97% 96%  97%  Weight:      Height:        Intake/Output Summary (Last 24 hours) at 12/05/16 1010 Last data filed at 12/05/16 0545  Gross per 24 hour  Intake              260 ml  Output              375  ml  Net             -115 ml   Filed Weights   12/03/16 1032 12/04/16 0501 12/05/16 0500  Weight: 205 lb 11 oz (93.3 kg) 194 lb (88 kg) 206 lb 12.7 oz (93.8 kg)    Telemetry    NSR - Personally Reviewed  ECG    NSR, PRWP anteriorly, lateral T wave inversion - Personally Reviewed  Physical Exam  Calm, comfortable GEN: No acute distress.   Neck: No JVD Cardiac: RRR, no murmurs, rubs, or gallops.  Respiratory: Clear to auscultation bilaterally. GI: Soft, nontender, non-distended  MS: ++edema; No deformity. Neuro:  Nonfocal  Psych: Normal affect   Labs    Chemistry Recent Labs Lab 12/04/16 0003 12/04/16 1011 12/05/16 0533  NA 128* 128* 131*  K 4.3 4.4 4.4  CL 92* 92* 93*  CO2 24 23 29   GLUCOSE 301* 289* 172*  BUN 83* 87* 94*  CREATININE 4.28* 4.39* 4.75*  CALCIUM 8.4* 8.3* 8.4*  PROT  --  6.6  --   ALBUMIN  --  2.5*  --   AST  --  51*  --  ALT  --  40  --   ALKPHOS  --  125  --   BILITOT  --  0.7  --   GFRNONAA 9* 9* 8*  GFRAA 11* 11* 10*  ANIONGAP 12 13 9      Hematology Recent Labs Lab 12/04/16 0003 12/05/16 0533  WBC 7.0 5.0  RBC 2.82* 2.64*  HGB 8.2* 7.7*  HCT 26.2* 24.5*  MCV 92.9 92.8  MCH 29.1 29.2  MCHC 31.3 31.4  RDW 17.4* 18.1*  PLT 269 283    Cardiac Enzymes Recent Labs Lab 12/04/16 0004 12/04/16 1011  TROPONINI 7.82* 5.78*   No results for input(s): TROPIPOC in the last 168 hours.   BNP Recent Labs Lab 12/04/16 1820  BNP 2,222.9*     DDimer No results for input(s): DDIMER in the last 168 hours.   Radiology    Dg Chest 2 View  Result Date: 12/05/2016 CLINICAL DATA:  71 year old female with history of dyspnea. EXAM: CHEST  2 VIEW COMPARISON:  Chest x-ray 12/03/2016. FINDINGS: Persistent moderate right and small left pleural effusions. Bibasilar opacities (right greater than left), suggest superimposed areas of subsegmental atelectasis, although underlying airspace consolidation is not entirely excluded. No evidence of  pulmonary edema. Heart size is normal. Upper mediastinal contours are within normal limits. IMPRESSION: 1. Persistent moderate right and small left pleural effusions with associated areas of probable passive subsegmental atelectasis throughout the lung bases bilaterally. Electronically Signed   By: Vinnie Langton M.D.   On: 12/05/2016 07:53   Dg Chest Port 1 View  Result Date: 12/03/2016 CLINICAL DATA:  Shortness of breath. EXAM: PORTABLE CHEST 1 VIEW COMPARISON:  12/01/2016 FINDINGS: Bilateral lower lung opacities and bilateral pleural effusions again noted. Visualized cardiomediastinal silhouette is unchanged. No pneumothorax. There has been little interval change since the prior study. IMPRESSION: Unchanged appearance of the chest with bilateral lower lung opacities/ atelectasis and bilateral pleural effusions. Electronically Signed   By: Margarette Canada M.D.   On: 12/03/2016 15:09    Cardiac Studies   11/2016 nuclear stress: large inferolateral scar, LVEF 22%.  11/2015 cath: patent RCA stents, mod LAD disease, LCX CTO. Recs for medical thearpy.  11/2016 LVEF 30-35%, grade II diastolic dysfunciton  Patient Profile     71 y.o. female with CHF due to CAD (NSTEMI 06/2015 with DES to PDA and RCA at that time, subtotal occlusion of LCX treated medically; repeat cath 11/2015 with stable disease; inferolateral scar w/o ischemia), LVEF 30-35%, grade II diastolic dysfunction, paroxysmal atrial fibrillation, right subclavian stenosis s/p intervention, DM2, HTN, HL, CKD IV admitted with SOB and fluid overload initially to [place]    1. CHF: still clearly volume overloaded, but poor UO and worsening renal function limit use of diuretics. Will defer use of diuretics versus HD to Nephrology. Avoid all RAAs inh. On beta blocker. 2. CAD: no significant are of viability/reversible ischemia. Risks of LCX intervention outweigh the benefit. Has abnormal cardiac enzymes, c/w another small  NSTEMI, but no angina. No plan for invasive procedures. 3. PAF: on amiodarone, no arrhythmia last 24 h. 4. Warfarin: INR improving but still supratherapeutic (likely CHF related hepatic dysfunction as well as amiodarone interaction). 5. Ac on chr renal failure: metoprolol dose reduced today. Background of DM/HTN nephropathy, baseline creat in 2s. Will reduce the rosuvastatin dose.  For questions or updates, please contact [place] Please consult [email or web address] for contact info under Cardiology/STEMI.      Signed, Sanda Klein, MD  12/05/2016, 10:10 AM  ones

## 2016-12-05 NOTE — Progress Notes (Signed)
[place] for warfarin Indication: atrial fibrillation  Allergies  Allergen Reactions  . Ace Inhibitors Cough  . Amlodipine Other (See Comments)    Edema   . Codeine Rash    Patient Measurements: Height: 5\' 8"  (172.7 cm) Weight: 206 lb 12.7 oz (93.8 kg) IBW/kg (Calculated) : 63.9  Assessment: 71 YOF transferred from [facility] on 9/29. On Coumadin alternating 7.5mg  and 3/75mg  every other day PTA for Afib per records from [place] = 5. Has been on clindamycin and trimethoprim recently. Also on amiodarone. INR was elevated on admit and not trending down to 4.51. May be able to restart Coumadin tomorrow.  Goal of Therapy:  INR 2-3 Monitor platelets by anticoagulation protocol: Yes   Plan:  Hold warfarin tonight Monitor daily INR, CBC, s/s of bleed   Elenor Quinones, PharmD, [facility] Clinical Pharmacist Pager [phone] 12/05/2016 10:36 AM

## 2016-12-05 NOTE — Progress Notes (Signed)
Patient ID: [name], female   DOB: 1945/07/03, 71 y.o.   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 Fib started on anticoagulation, recent acute on chronic heart failure and pneumonia for which she needed hospitalization and thoracentesis was transferred from [facility] for complaints of worsening shortness of breath. She was admitted with acute on chronic heart failure and acute on chronic kidney disease.   Assessment & Plan:   Principal Problem:   Acute on chronic combined systolic and diastolic CHF (congestive heart failure) (HCC) Active Problems:   Essential hypertension   Coronary artery disease   GERD   Elevated troponin   Acute renal failure superimposed on chronic kidney disease (HCC)   Acute respiratory failure with hypoxia (HCC)   DM type 2 causing vascular disease (HCC)   Pleural effusion on right   Anemia in chronic kidney disease   Atrial fibrillation (HCC)   Ischemic cardiomyopathy   Hypercoagulopathy (HCC)   Acute on chronic combined systolic and diastolic heart failure with recent echo showing ejection fraction of 30-35% and grade 2 diastolic dysfunction: - She also had a nuclear stress test on September 11 revealing a large scar consistent with prior infarct but no ischemic territories. - cardiology following. Diuretics on hold because of worsening renal function. Continue Imdur, Toprol, amiodarone. Follow further cardiology recommendations. -Daily weights -strict Intake and output - patient has had recent multiple admissions. Palliative care consult for goals of  care   Acute renal failure superimposed on chronic kidney disease stage IV - creatinine worsening. Nephrology following. Follow recommendations. -Monitor urine output -Hold nephrotoxins as able  Acute respiratory failure with hypoxia.  - Resolved on admission. Patient did require BiPAP 24 hours ago. -Continue oxygen supplementation as needed -follow chest x-ray   Elevated INR. - INR 4.51 today. No signs of bleeding. Hold Coumadin. Repeat INR in a.m.   Recent diagnosis of A. Fib. [place] vasc score5 - currently in sinus rhythm with bradycardia. Continue amiodarone, Toprol. Follow cardiology recommendations  CAD with stents/elevated troponins. No chest pain. Elevated troponins presumably related to demand ischemia. -Cardiology following   Diabetes mellitus type II - continue Lantus and SSI    Pleural effusion. Recent thoracentesis where 1.7 L was removed. Chart review indicates fluid was thought to be a transudate from CHF. Cytology was negative for malignancy and revealed reactive mesothelial cells only -follow chest x-ray for today  Subclavian artery occlusion. Chart review indicates patient underwent thrombectomy on May 2017 and again in July 2018.   Anemia of chronic disease.  - hemoglobin stable. Monitor    DVT prophylaxis: SCDs Code Status:  full Family Communication: none at bedside Disposition Plan:depends on clinical improvement  Consultants: cardiology and nephrology  Procedures: none  Antimicrobials: none    Subjective: Patient seen and examined at bedside.she complains of intermittent shortness of breath but no current chest pain. No fever,nausea or vomiting.  Objective: Vitals:   12/05/16 0500 12/05/16 0600 12/05/16 0700 12/05/16 0759  BP: 125/71 131/68 131/85   Pulse: (!) 58 (!) 56 (!) 56 (!) 57  Resp: (!) 9 (!) 21 20 19   Temp:    98.1 F (36.7 C)  TempSrc:    Oral  SpO2: 100% 98% 97% 96%  Weight: 93.8 kg (206 lb 12.7 oz)     Height:         Intake/Output Summary (Last 24 hours) at 12/05/16 0820 Last data filed at 12/05/16 0545  Gross per 24 hour  Intake              383 ml  Output              375 ml  Net                8 ml   Filed Weights   12/03/16 1032 12/04/16 0501 12/05/16 0500  Weight: 93.3 kg (205 lb 11 oz) 88 kg (194 lb) 93.8 kg (206 lb 12.7 oz)    Examination:  General exam: Appears calm and comfortable but older than stated age Respiratory system: Bilateral decreased breath sound at bases with scattered basilar crackles Cardiovascular system: S1 & S2 heard, rate controlled  Gastrointestinal: soft, nontender bowel sounds positive Extremities: No cyanosis, clubbing; bilateral lower extremity 1-2+ edema  Data Reviewed: I have personally reviewed following labs and imaging studies  CBC:  Recent Labs Lab 12/04/16 0003 12/05/16 0533  WBC 7.0 5.0  NEUTROABS  --  3.7  HGB 8.2* 7.7*  HCT 26.2* 24.5*  MCV 92.9 92.8  PLT 269 379   Basic Metabolic Panel:  Recent Labs Lab 12/04/16 0003 12/04/16 1011 12/05/16 0533  NA 128* 128* 131*  K 4.3 4.4 4.4  CL 92* 92* 93*  CO2 24 23 29   GLUCOSE 301* 289* 172*  BUN 83* 87* 94*  CREATININE 4.28* 4.39* 4.75*  CALCIUM 8.4* 8.3* 8.4*  MG  --   --  2.7*   GFR: Estimated Creatinine Clearance: 12.8 mL/min (A) (by C-G formula based on SCr of 4.75 mg/dL (H)). Liver Function Tests:  Recent Labs Lab 12/04/16 1011  AST 51*  ALT 40  ALKPHOS 125  BILITOT 0.7  PROT 6.6  ALBUMIN 2.5*   No results for input(s): LIPASE, AMYLASE in the last 168 hours. No results for input(s): AMMONIA in the last 168 hours. Coagulation Profile:  Recent Labs Lab 12/04/16 0003 12/05/16 0533  INR 6.71* 4.51*   Cardiac Enzymes:  Recent Labs Lab 12/04/16 0004 12/04/16 1011  TROPONINI 7.82* 5.78*   BNP (last 3 results) No results for input(s): PROBNP in the last 8760 hours. HbA1C: No results for input(s): HGBA1C in the last 72 hours. CBG:  Recent Labs Lab  12/03/16 2156 12/04/16 0811 12/04/16 1300 12/04/16 1551 12/04/16 2040  GLUCAP 286* 283* 268* 284* 306*   Lipid Profile: No results for input(s): CHOL, HDL, LDLCALC, TRIG, CHOLHDL, LDLDIRECT in the last 72 hours. Thyroid Function Tests: No results for input(s): TSH, T4TOTAL, FREET4, T3FREE, THYROIDAB in the last 72 hours. Anemia Panel: No results for input(s): VITAMINB12, FOLATE, FERRITIN, TIBC, IRON, RETICCTPCT in the last 72 hours. Sepsis Labs: No results for input(s): PROCALCITON, LATICACIDVEN in the last 168 hours.  Recent Results (from the past 240 hour(s))  MRSA PCR Screening     Status: None   Collection Time: 12/03/16 10:41 AM  Result Value Ref Range Status   MRSA by PCR NEGATIVE NEGATIVE Final    Comment:        The GeneXpert MRSA Assay (FDA approved for NASAL specimens only), is one component of a comprehensive MRSA colonization surveillance program. It is not intended to diagnose MRSA infection nor to guide or monitor treatment for MRSA infections.  Radiology Studies: Dg Chest 2 View  Result Date: 12/05/2016 CLINICAL DATA:  71 year old female with history of dyspnea. EXAM: CHEST  2 VIEW COMPARISON:  Chest x-ray 12/03/2016. FINDINGS: Persistent moderate right and small left pleural effusions. Bibasilar opacities (right greater than left), suggest superimposed areas of subsegmental atelectasis, although underlying airspace consolidation is not entirely excluded. No evidence of pulmonary edema. Heart size is normal. Upper mediastinal contours are within normal limits. IMPRESSION: 1. Persistent moderate right and small left pleural effusions with associated areas of probable passive subsegmental atelectasis throughout the lung bases bilaterally. Electronically Signed   By: Vinnie Langton M.D.   On: 12/05/2016 07:53   Dg Chest Port 1 View  Result Date: 12/03/2016 CLINICAL DATA:  Shortness of breath. EXAM: PORTABLE CHEST 1 VIEW COMPARISON:  12/01/2016  FINDINGS: Bilateral lower lung opacities and bilateral pleural effusions again noted. Visualized cardiomediastinal silhouette is unchanged. No pneumothorax. There has been little interval change since the prior study. IMPRESSION: Unchanged appearance of the chest with bilateral lower lung opacities/ atelectasis and bilateral pleural effusions. Electronically Signed   By: Margarette Canada M.D.   On: 12/03/2016 15:09        Scheduled Meds: . ALPRAZolam  0.5 mg Oral QHS  . amiodarone  200 mg Oral BID  . Chlorhexidine Gluconate Cloth  6 each Topical Daily  . darbepoetin (ARANESP) injection - NON-DIALYSIS  100 mcg Subcutaneous Q Sun-1800  . ferrous sulfate  325 mg Oral Q breakfast  . fluticasone  1 spray Each Nare Daily  . insulin aspart  0-5 Units Subcutaneous QHS  . insulin aspart  0-9 Units Subcutaneous TID WC  . insulin glargine  15 Units Subcutaneous QHS  . isosorbide mononitrate  30 mg Oral Daily  . latanoprost  1 drop Both Eyes QHS  . metoprolol succinate  50 mg Oral Q breakfast  . pantoprazole  40 mg Oral Daily  . rosuvastatin  40 mg Oral QHS  . sodium chloride flush  10-40 mL Intracatheter Q12H  . sodium chloride flush  3 mL Intravenous Q12H  . Warfarin - Pharmacist Dosing Inpatient   Does not apply q1800   Continuous Infusions: . sodium chloride       LOS: 2 days        Aline August, MD [facility] Pager [phone]  If 7PM-7AM, please contact night-coverage [email or web address] Password [email or web address] 12/05/2016, 8:20 AM

## 2016-12-05 NOTE — Plan of Care (Signed)
Problem: Tissue Perfusion: Goal: Risk factors for ineffective tissue perfusion will decrease Outcome: Progressing Patient not receiving Coumadin, as INR is still critically high at 4.51 this morning.   Problem: Activity: Goal: Risk for activity intolerance will decrease Outcome: Progressing Patient is very tired and states that she knows that she needs to be out of bed- that she's getting weaker. Assisted patient to standing for daily weight this morning and she was very weak and shaky- requiring 2 assist.   Problem: Fluid Volume: Goal: Ability to maintain a balanced intake and output will improve Outcome: Progressing Patient still has a foley catheter in place- it was placed by [facility] for strict I and O monitoring. This will need to be addressed at this hospital for consistency. Patient currently having renal problems and will get approval from doctor prior to removal.

## 2016-12-05 NOTE — Consult Note (Signed)
   [facility] Inpatient Consult   12/05/2016  [name] 10/04/1945 [id number]  Patient is currently active with [facility] for chronic disease management services and was transferred to [facility] from [facility] .  Patient has been engaged by a [facility] RN [facility].  Patient with multiple admissions noted. Patient in the Medicare ACO.   Spoke with RN [facility] about ongoing care management needs. Patient admitted with chronic combined systolic and diastolic HF.   Patient is has been seen at [place] office because patient lives in [place]. Met with the patient at the bedside.  Patient is weak and speaks softly.  Noted a recent Palliative Consult.  Patient endorses Dr. [name] as her primary care provider and consents to ongoing [facility] follow up, if remains appropraite with disposition needs. Will follow up with  Inpatient Case Manager to make aware of [facility] involvement. Of note, [facility] Care Management services does not replace or interfere with any services that are needed or arranged by inpatient case management or social work.  For additional questions or referrals please contact:  Natividad Brood, RN BSN [facility] Liaison  [phone] business mobile phone Toll free office [phone]

## 2016-12-05 NOTE — Progress Notes (Signed)
   12/05/16 1600  Clinical Encounter Type  Visited With Patient  Visit Type Initial;Spiritual support  Referral From Nurse  Spiritual Encounters  Spiritual Needs Prayer  Stress Factors  Patient Stress Factors None identified  Family Stress Factors None identified   Visited with [name]. Nurse expressed she may enjoy a visit.  Visit was nice and Patient very much wanted prayer. Listened to her concerns and had prayer with patient. Conard Novak, Chaplain

## 2016-12-05 NOTE — Progress Notes (Signed)
Patient found to have bloody nose on assessment. This is consistent with critical PT/INR. Continue to monitor and updated day shift nurse about bleeding.   Milford Cage, RN

## 2016-12-05 NOTE — Progress Notes (Signed)
CRITICAL VALUE ALERT  Critical Value:  INR 4.51  Date & Time Notied:  12/05/2016 8721  Provider Notified: Raliegh Ip Schorr ( [facility] night coverage)   Orders Received/Actions taken: no new orders received  Milford Cage, RN

## 2016-12-05 NOTE — Progress Notes (Signed)
[facility] Rounding Note  Subjective/Interval History:  Says really down "Somebody told me my kidneys were gone" UOP has picked up just a little HR still low BP OK  Objective Vital signs in last 24 hours: Vitals:   12/05/16 0859 12/05/16 0900 12/05/16 1148 12/05/16 1920  BP: 125/62 125/62 (!) 125/59 131/60  Pulse: (!) 59 (!) 56 (!) 58 83  Resp:  18 (!) 22 (!) 24  Temp:  98.1 F (36.7 C) 97.6 F (36.4 C) 98.1 F (36.7 C)  TempSrc:  Oral Oral Oral  SpO2:  97% 94% 94%  Weight:      Height:       Weight change: 0.5 kg (1 lb 1.6 oz)  Intake/Output Summary (Last 24 hours) at 12/05/16 1953 Last data filed at 12/05/16 1700  Gross per 24 hour  Intake              250 ml  Output              975 ml  Net             -725 ml   Physical Exam:  Blood pressure 131/60, pulse 83, temperature 98.1 F (36.7 C), temperature source Oral, resp. rate (!) 24, height 5\' 8"  (1.727 m), weight 93.8 kg (206 lb 12.7 oz), SpO2 94 %.  Awake, alert, oriented, NAD VS as noted Lungs clear S1S2 No S3 Abd soft and not tender 2+ edema both LE's No asterixus  Recent Labs Lab 12/04/16 0003 12/04/16 1011 12/05/16 0533  NA 128* 128* 131*  K 4.3 4.4 4.4  CL 92* 92* 93*  CO2 24 23 29   GLUCOSE 301* 289* 172*  BUN 83* 87* 94*  CREATININE 4.28* 4.39* 4.75*  CALCIUM 8.4* 8.3* 8.4*    Recent Labs Lab 12/04/16 1011  AST 51*  ALT 40  ALKPHOS 125  BILITOT 0.7  PROT 6.6  ALBUMIN 2.5*     Recent Labs Lab 12/04/16 0003 12/05/16 0533  WBC 7.0 5.0  NEUTROABS  --  3.7  HGB 8.2* 7.7*  HCT 26.2* 24.5*  MCV 92.9 92.8  PLT 269 283    Recent Labs Lab 12/04/16 0004 12/04/16 1011  TROPONINI 7.82* 5.78*    Recent Labs Lab 12/04/16 1551 12/04/16 2040 12/05/16 0757 12/05/16 1146 12/05/16 1714  GLUCAP 284* 306* 151* 182* 176*    Iron Studies: No results for input(s): IRON, TIBC, TRANSFERRIN, FERRITIN in the last 168 hours. Studies/Results: Dg Chest 2 View  Result Date: 12/05/2016 CLINICAL  DATA:  71 year old female with history of dyspnea. EXAM: CHEST  2 VIEW COMPARISON:  Chest x-ray 12/03/2016. FINDINGS: Persistent moderate right and small left pleural effusions. Bibasilar opacities (right greater than left), suggest superimposed areas of subsegmental atelectasis, although underlying airspace consolidation is not entirely excluded. No evidence of pulmonary edema. Heart size is normal. Upper mediastinal contours are within normal limits. IMPRESSION: 1. Persistent moderate right and small left pleural effusions with associated areas of probable passive subsegmental atelectasis throughout the lung bases bilaterally. Electronically Signed   By: Vinnie Langton M.D.   On: 12/05/2016 07:53   Medications: . sodium chloride     . ALPRAZolam  0.5 mg Oral QHS  . amiodarone  200 mg Oral BID  . Chlorhexidine Gluconate Cloth  6 each Topical Daily  . darbepoetin (ARANESP) injection - NON-DIALYSIS  100 mcg Subcutaneous Q Sun-1800  . ferrous sulfate  325 mg Oral Q breakfast  . fluticasone  1 spray Each Nare Daily  .  insulin aspart  0-5 Units Subcutaneous QHS  . insulin aspart  0-9 Units Subcutaneous TID WC  . insulin glargine  15 Units Subcutaneous QHS  . isosorbide mononitrate  30 mg Oral Daily  . latanoprost  1 drop Both Eyes QHS  . metoprolol succinate  50 mg Oral Q breakfast  . pantoprazole  40 mg Oral Daily  . rosuvastatin  10 mg Oral QHS  . sodium chloride flush  10-40 mL Intracatheter Q12H  . sodium chloride flush  3 mL Intravenous Q12H  . Warfarin - Pharmacist Dosing Inpatient   Does not apply q1800    Background: 71 y.o. 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 Lorrene Reid  - BL creatinine mid 2's) admitted with SOB and fluid overload initially to [facility]   Assessment/Recommendations  1. 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 DM, HTN, OAB issues. BL creatinine usual mid to low 2's (2.0  as recently asl 11/27/16) with AKI on CKD in setting of AFib, large doses toprol, low HR and BP. I do believe as Dr. Moshe Cipro indicated in her initial consult that much of this is hemodynamically driven. Toprol dose reduced as of today to 50, lower further to 25. Can't really push diuretics for volume overload with lowish BP but fortunately pulm wise not in extremis. Not uremic, no dialysis indications as of yet.  2. Anemia  - has had good outpt Hb's. Has required Feraheme in the past, never Aranesp. Started that here. 100 mcg/week (1st dosed 9/30). Fe studies not done - ordered for AM. Would personally have low threshold to transfuse given her CAD.  3. CAD - + trops falling. Several prior interventions. No plans for cath so far this trip. Risk outweighs benefit per cards.  4. DM - per primary service  Jamal Maes, MD [facility] [phone] pager 12/05/2016, 7:53 PM

## 2016-12-06 ENCOUNTER — Other Ambulatory Visit: Payer: Self-pay | Admitting: *Deleted

## 2016-12-06 DIAGNOSIS — J90 Pleural effusion, not elsewhere classified: Secondary | ICD-10-CM

## 2016-12-06 DIAGNOSIS — Z7189 Other specified counseling: Secondary | ICD-10-CM

## 2016-12-06 DIAGNOSIS — Z515 Encounter for palliative care: Secondary | ICD-10-CM

## 2016-12-06 LAB — RENAL FUNCTION PANEL
Albumin: 2.3 g/dL — ABNORMAL LOW (ref 3.5–5.0)
Anion gap: 11 (ref 5–15)
BUN: 96 mg/dL — ABNORMAL HIGH (ref 6–20)
CHLORIDE: 92 mmol/L — AB (ref 101–111)
CO2: 27 mmol/L (ref 22–32)
Calcium: 8.4 mg/dL — ABNORMAL LOW (ref 8.9–10.3)
Creatinine, Ser: 4.65 mg/dL — ABNORMAL HIGH (ref 0.44–1.00)
GFR, EST AFRICAN AMERICAN: 10 mL/min — AB (ref >60)
GFR, EST NON AFRICAN AMERICAN: 9 mL/min — AB (ref >60)
Glucose, Bld: 184 mg/dL — ABNORMAL HIGH (ref 65–99)
POTASSIUM: 4.1 mmol/L (ref 3.5–5.1)
Phosphorus: 5.7 mg/dL — ABNORMAL HIGH (ref 2.5–4.6)
Sodium: 130 mmol/L — ABNORMAL LOW (ref 135–145)

## 2016-12-06 LAB — MAGNESIUM: MAGNESIUM: 2.7 mg/dL — AB (ref 1.7–2.4)

## 2016-12-06 LAB — CBC WITH DIFFERENTIAL/PLATELET
Basophils Absolute: 0 10*3/uL (ref 0.0–0.1)
Basophils Relative: 0 %
EOS PCT: 1 %
Eosinophils Absolute: 0 10*3/uL (ref 0.0–0.7)
HCT: 25.5 % — ABNORMAL LOW (ref 36.0–46.0)
Hemoglobin: 7.8 g/dL — ABNORMAL LOW (ref 12.0–15.0)
LYMPHS ABS: 0.8 10*3/uL (ref 0.7–4.0)
Lymphocytes Relative: 14 %
MCH: 28.5 pg (ref 26.0–34.0)
MCHC: 30.6 g/dL (ref 30.0–36.0)
MCV: 93.1 fL (ref 78.0–100.0)
MONOS PCT: 8 %
Monocytes Absolute: 0.5 10*3/uL (ref 0.1–1.0)
Neutro Abs: 4.4 10*3/uL (ref 1.7–7.7)
Neutrophils Relative %: 77 %
PLATELETS: 294 10*3/uL (ref 150–400)
RBC: 2.74 MIL/uL — AB (ref 3.87–5.11)
RDW: 17.5 % — ABNORMAL HIGH (ref 11.5–15.5)
WBC: 5.7 10*3/uL (ref 4.0–10.5)

## 2016-12-06 LAB — GLUCOSE, CAPILLARY
GLUCOSE-CAPILLARY: 142 mg/dL — AB (ref 65–99)
GLUCOSE-CAPILLARY: 153 mg/dL — AB (ref 65–99)
GLUCOSE-CAPILLARY: 282 mg/dL — AB (ref 65–99)
Glucose-Capillary: 193 mg/dL — ABNORMAL HIGH (ref 65–99)

## 2016-12-06 LAB — IRON AND TIBC
Iron: 23 ug/dL — ABNORMAL LOW (ref 28–170)
Saturation Ratios: 8 % — ABNORMAL LOW (ref 10.4–31.8)
TIBC: 276 ug/dL (ref 250–450)
UIBC: 253 ug/dL

## 2016-12-06 LAB — PROTIME-INR
INR: 4.24
PROTHROMBIN TIME: 40.5 s — AB (ref 11.4–15.2)

## 2016-12-06 MED ORDER — DEXTROSE 5 % IV SOLN
120.0000 mg | Freq: Two times a day (BID) | INTRAVENOUS | Status: DC
  Administered 2016-12-06 – 2016-12-10 (×8): 120 mg via INTRAVENOUS
  Filled 2016-12-06: qty 10
  Filled 2016-12-06 (×2): qty 12
  Filled 2016-12-06: qty 10
  Filled 2016-12-06: qty 12
  Filled 2016-12-06 (×2): qty 10
  Filled 2016-12-06: qty 12
  Filled 2016-12-06: qty 10

## 2016-12-06 MED ORDER — GUAIFENESIN-DM 100-10 MG/5ML PO SYRP
15.0000 mL | ORAL_SOLUTION | ORAL | Status: DC | PRN
  Administered 2016-12-07 – 2016-12-09 (×2): 15 mL via ORAL
  Filled 2016-12-06 (×2): qty 15

## 2016-12-06 MED ORDER — GLUCERNA SHAKE PO LIQD
237.0000 mL | Freq: Two times a day (BID) | ORAL | Status: DC
  Administered 2016-12-07 (×2): 237 mL via ORAL

## 2016-12-06 MED ORDER — POLYETHYLENE GLYCOL 3350 17 G PO PACK
17.0000 g | PACK | Freq: Two times a day (BID) | ORAL | Status: DC
  Administered 2016-12-06 – 2016-12-12 (×11): 17 g via ORAL
  Filled 2016-12-06 (×13): qty 1

## 2016-12-06 MED ORDER — SODIUM CHLORIDE 0.9 % IV SOLN
510.0000 mg | INTRAVENOUS | Status: DC
  Administered 2016-12-06: 510 mg via INTRAVENOUS
  Filled 2016-12-06: qty 17

## 2016-12-06 NOTE — Progress Notes (Signed)
Daily Progress Note   Patient Name: [name]       Date: 12/06/2016 DOB: April 18, 1945  Age: 71 y.o. MRN#: [id number] Attending Physician: Aline August, MD Primary Care Physician: [name], MD Admit Date: 12/03/2016  Reason for Consultation/Follow-up: Establishing goals of care  Subjective: [name] is more alert and talkative today. Still lethargic and very flat affect.   Length of Stay: 3  Current Medications: Scheduled Meds:  . ALPRAZolam  0.5 mg Oral QHS  . amiodarone  200 mg Oral BID  . Chlorhexidine Gluconate Cloth  6 each Topical Daily  . darbepoetin (ARANESP) injection - NON-DIALYSIS  100 mcg Subcutaneous Q Sun-1800  . ferrous sulfate  325 mg Oral Q breakfast  . fluticasone  1 spray Each Nare Daily  . insulin aspart  0-5 Units Subcutaneous QHS  . insulin aspart  0-9 Units Subcutaneous TID WC  . insulin glargine  15 Units Subcutaneous QHS  . isosorbide mononitrate  30 mg Oral Daily  . latanoprost  1 drop Both Eyes QHS  . metoprolol succinate  25 mg Oral Q breakfast  . pantoprazole  40 mg Oral Daily  . rosuvastatin  10 mg Oral QHS  . sodium chloride flush  10-40 mL Intracatheter Q12H  . sodium chloride flush  3 mL Intravenous Q12H  . Warfarin - Pharmacist Dosing Inpatient   Does not apply q1800    Continuous Infusions: . sodium chloride      PRN Meds: sodium chloride, acetaminophen, albuterol, ondansetron (ZOFRAN) IV, polyethylene glycol, sodium chloride flush, sodium chloride flush  Physical Exam  Constitutional: She is oriented to person, place, and time. She appears well-developed.  HENT:  Head: Normocephalic and atraumatic.  Cardiovascular: Regular rhythm.  Bradycardia present.   Pulmonary/Chest: Effort normal. No accessory muscle usage. No  tachypnea. No respiratory distress.  Abdominal: Normal appearance.  Neurological: She is alert and oriented to person, place, and time.  Nursing note and vitals reviewed.           Vital Signs: BP 132/67   Pulse 60   Temp 98.6 F (37 C) (Oral)   Resp (!) 32   Ht '5\' 8"'$  (1.727 m)   Wt 95 kg (209 lb 7 oz)   SpO2 100%   BMI 31.84 kg/m  SpO2: SpO2: 100 % O2 Device: O2  Device: Nasal Cannula O2 Flow Rate: O2 Flow Rate (L/[name]): 1 L/[name]  Intake/output summary:  Intake/Output Summary (Last 24 hours) at 12/06/16 1011 Last data filed at 12/06/16 0904  Gross per 24 hour  Intake              400 ml  Output             1250 ml  Net             -850 ml   LBM: Last BM Date: 11/30/16 Baseline Weight: Weight: 93.3 kg (205 lb 11 oz) Most recent weight: Weight: 95 kg (209 lb 7 oz)       Palliative Assessment/Data: 30%      Patient Active Problem List   Diagnosis Date Noted  . Hypercoagulopathy ([place]) 12/03/2016  . Acute on chronic heart failure ([place]) 12/03/2016  . Ischemic cardiomyopathy   . Generalized weakness   . Atrial fibrillation with RVR ([place]) 11/19/2016  . Abnormal nuclear stress test   . Left ventricular dysfunction   . Atrial fibrillation ([place]) 11/13/2016  . Anemia in chronic kidney disease   . Atrial fibrillation, new onset ([place]) 11/10/2016  . Hypoglycemia 11/10/2016  . Pleural effusion on right 10/31/2016  . HCAP (healthcare-associated pneumonia) 10/31/2016  . DM type 2 causing vascular disease ([place]) 10/05/2016  . Personal history of noncompliance with medical treatment, presenting hazards to health 08/22/2016  . Hyperglycemia 08/07/2016  . Pulmonary edema 07/14/2016  . CKD (chronic kidney disease), stage IV ([place]) 07/14/2016  . Acute respiratory failure with hypoxia ([place]) 07/09/2016  . Acute on chronic systolic CHF (congestive heart failure) ([place]) 12/01/2015  . AKI (acute kidney injury) ([place])   . Ischemia of upper extremity   . Right knee pain   . Subclavian  artery stenosis, right ([place]) 07/07/2015  . Acute on chronic combined systolic and diastolic CHF (congestive heart failure) ([place]) 07/04/2015  . Elevated troponin 07/04/2015  . Acute renal failure superimposed on chronic kidney disease ([place]) 07/04/2015  . Non-ST elevation (NSTEMI) myocardial infarction ([place]) 07/04/2015  . Elevated d-dimer 07/04/2015  . Acute respiratory failure with hypercapnia ([place])   . Bilateral lower extremity edema 01/20/2012  . Type 2 diabetes mellitus with stage 4 chronic kidney disease, with long-term current use of insulin ([place]) 01/21/2011  . Overweight 07/20/2009  . Coronary artery disease 10/08/2008  . HEART MURMUR, SYSTOLIC 00/34/9179  . SHOULDER PAIN 02/05/2007  . Hyperlipemia 04/11/2006  . Anxiety state 04/11/2006  . SYNDROME, CARPAL TUNNEL 04/11/2006  . Essential hypertension 04/11/2006  . ALLERGIC RHINITIS 04/11/2006  . GERD 04/11/2006  . Constipation 04/11/2006  . OVERACTIVE BLADDER 04/11/2006  . OSTEOARTHRITIS 04/11/2006  . 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 Fib admitted 12/03/2016 with SOB and CHF exacerbation with acute on chronic renal failure.   Assessment: I met again with [name]. She is more talkative and alert today but still sleepy. We briefly reviewed her numerous hospitalizations and the causes and concern related to her heart failure that is complicated and further complicating her renal function. We discussed the plan from renal for medication changes and hopes that she  can recover some of her renal function. We discussed that  we are very concerned with the numerous admissions and that this is a sign of worsening disease process. We did briefly discuss that she may need to at least begin to think and talk with her family about some bigger decisions she may be faced with now or sometime in the future such as dialysis and code status. She seems quite overwhelmed so I told her that it is just something to think about and that all her providers will continue to talk with her about where she is and what she needs.   She is expecting Dr. Lorrene Reid today who she has followed with outpatient. I encouraged her to speak with Dr. Lorrene Reid and that she will provide her with direction and recommendations as she is much more familiar with her medical history.   Emotional support provided. Told Ms. Berkland I will check on her again tomorrow. She is hopeful for improvement.   Recommendations/Plan:  Cardiology and renal following.  No SOB and on room air today. Having urine output.   Goals of Care and Additional Recommendations:  Limitations on Scope of Treatment: Full Scope Treatment  Code Status:  Full code  Prognosis:   Unable to determine.   Discharge Planning:  To Be Determined  Thank you for allowing the Palliative Medicine Team to assist in the care of this patient.   Total Time 35 [name] Prolonged Time Billed  no       Greater than 50%  of this time was spent counseling and coordinating care related to the above assessment and plan.  Vinie Sill, NP Palliative Medicine Team Pager # [phone] (M-F 8a-5p) Team Phone # [phone] (Nights/Weekends)

## 2016-12-06 NOTE — Evaluation (Signed)
Physical Therapy Evaluation Patient Details Name: [name] MRN: [id number] DOB: 03-14-1945 Today's Date: 12/06/2016   History of Present Illness  Patient is a 71 y/o female who presents with worsening SOB. Admitted with acute on chronic heart failure and acute on chronic kidney disease. CXR-Persistent moderate right and small left pleural effusions. Recent hospital admission at [facility]. 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  Clinical Impression  Patient presents with lethargy, generalized weakness, impaired balance and impaired mobility s/p above. Tolerated SPT to chair with Min-Mod A for support/safety. Pt has been in/out of hospital in the last few weeks. 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 Was getting HHPT. Pt has support from daughter who is an amputee. Would benefit from SNF to maximize independence and mobility prior to return home. Will follow acutely as pt really motivated to be able to return home.     Follow Up Recommendations SNF;Supervision for mobility/OOB    Equipment Recommendations  None recommended by PT    Recommendations for Other Services OT consult     Precautions / Restrictions Precautions Precautions: Fall Restrictions Weight Bearing Restrictions: No      Mobility  Bed Mobility Overal bed mobility: Needs Assistance Bed Mobility: Rolling;Sidelying to Sit Rolling: Min guard Sidelying to sit: Min guard;HOB elevated       General bed mobility comments: Use of rail to get to EOB.   Transfers Overall transfer level: Needs assistance Equipment used: 1 person hand held assist Transfers: Sit to/from [facility] Sit to Stand: Min assist Stand pivot transfers: Mod assist       General transfer comment: Assist to power to standing from EOB x1, SPT to chair with Mod A for balance and cues to  reach for arm rest.   Ambulation/Gait                Stairs            Wheelchair Mobility    Modified Rankin (Stroke Patients Only)       Balance Overall balance assessment: Needs assistance Sitting-balance support: Feet supported;Single extremity supported Sitting balance-[name]: Poor Sitting balance - Comments: Requires UE support to maintain static sitting balance. Min A for support at times.    Standing balance support: During functional activity;Bilateral upper extremity supported Standing balance-[name]: Poor Standing balance comment: Requires Min-Mod A for standing balance and during transfer.                              Pertinent Vitals/Pain Pain Assessment: No/denies pain    Home Living Family/patient expects to be discharged to:: Private residence Living Arrangements: Children (daughter who is an amputee) Available Help at Discharge: Family;Available PRN/intermittently Type of Home: House Home Access: Level entry   Entrance Stairs-Number of Steps: no steps into house, but has 12 steps into basement with bilateral rails, occasionally has to go into basement Home Layout: Two level Home Equipment: Bedside commode;Walker - 2 wheels;Cane - single point      Prior Function Level of Independence: Independent with assistive device(s)         Comments: Works as a Teacher, 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      Hand Dominance   Dominant Hand: Right    Extremity/Trunk Assessment   Upper Extremity Assessment Upper Extremity Assessment:  Defer to OT evaluation    Lower Extremity Assessment Lower Extremity Assessment: Generalized weakness    Cervical / Trunk Assessment Cervical / Trunk Assessment: Normal  Communication   Communication: No difficulties  Cognition Arousal/Alertness: Lethargic Behavior During Therapy: WFL for tasks assessed/performed Overall Cognitive Status: Within Functional Limits for tasks  assessed                                 General Comments: A&Ox4. Difficulty staying awake during session, needs verbal stimulus to stay engaged. Reports being tired.       General Comments      Exercises     Assessment/Plan    PT Assessment Patient needs continued PT services  PT Problem List Decreased strength;Decreased activity tolerance;Decreased mobility;Decreased balance       PT Treatment Interventions Gait training;Stair training;Functional mobility training;Therapeutic activities;Therapeutic exercise;Patient/family education;Balance training    PT Goals (Current goals can be found in the Care Plan section)  Acute Rehab PT Goals Patient Stated Goal: go home PT Goal Formulation: With patient Time For Goal Achievement: 12/20/16 Potential to Achieve Goals: Fair    Frequency Min 3X/week   Barriers to discharge Decreased caregiver support      Co-evaluation               AM-PAC PT "6 Clicks" Daily Activity  Outcome Measure Difficulty turning over in bed (including adjusting bedclothes, sheets and blankets)?: None Difficulty moving from lying on back to sitting on the side of the bed? : None Difficulty sitting down on and standing up from a chair with arms (e.g., wheelchair, bedside commode, etc,.)?: Unable Help needed moving to and from a bed to chair (including a wheelchair)?: A Lot Help needed walking in hospital room?: Total Help needed climbing 3-5 steps with a railing? : Total 6 Click Score: 13    End of Session Equipment Utilized During Treatment: Gait belt Activity Tolerance: Patient limited by lethargy Patient left: in chair;with call bell/phone within reach;with chair alarm set Nurse Communication: Mobility status PT Visit Diagnosis: Unsteadiness on feet (R26.81);Other abnormalities of gait and mobility (R26.89);Muscle weakness (generalized) (M62.81)    Time: 1000-1025 PT Time Calculation (min) (ACUTE ONLY): 25 min   Charges:    PT Evaluation $PT Eval Moderate Complexity: 1 Mod PT Treatments $Therapeutic Activity: 8-22 mins   PT G Codes:        Wray Kearns, PT, DPT [phone]    Marguarite Arbour A Kalep Full 12/06/2016, 10:35 AM

## 2016-12-06 NOTE — Plan of Care (Signed)
Problem: Pain Managment: Goal: General experience of comfort will improve Outcome: Progressing Pt woke to cough up sputum once--otherwise pt sleeping well. BP cuff readjusted and new dressing applied to right arm for comfort (pt was itchy)  Problem: Skin Integrity: Goal: Risk for impaired skin integrity will decrease Outcome: Progressing Pt repositions self; does favor right side but is willing to adjust some when prompted   Problem: Tissue Perfusion: Goal: Risk factors for ineffective tissue perfusion will decrease Outcome: Not Progressing Foot pulses weak; audible with doppler; R wrist doppler; cool hands & feet (pt states chronic); cap refill <3 sec  Problem: Bowel/Gastric: Goal: Will not experience complications related to bowel motility Outcome: Progressing Pt given 2nd dose of bowel regimen; will keep monitoring    Problem: Education: Goal: Ability to verbalize understanding of medication therapies will improve Outcome: Progressing Pt willing to learn

## 2016-12-06 NOTE — Plan of Care (Signed)
Problem: Education: Goal: Knowledge of disease and its progression will improve Outcome: Progressing Encourage patient to continue to get out of bed daily.  Patient able to get out of bed today for 5 hrs and sit in bedside chair.

## 2016-12-06 NOTE — Evaluation (Signed)
Occupational Therapy Evaluation Patient Details Name: [name] MRN: [id number] DOB: 1945-04-15 Today's Date: 12/06/2016    History of Present Illness Patient is a 71 y/o female who presents with worsening SOB. Admitted with acute on chronic heart failure and acute on chronic kidney disease. CXR-Persistent moderate right and small left pleural effusions. Recent hospital admission at [facility]. 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   Clinical Impression   Pt was independent prior to admission, works as a Recruitment consultant. Pt presents with generalized weakness, decreased activity tolerance and poor standing balance. She has baseline numbness in the R hand. Pt's vital signs were stable throughout session. Did not attempt ambulation in the absence of a second person. Will follow acutely. Recommending post acute rehab in SNF.     Follow Up Recommendations  SNF;Supervision/Assistance - 24 hour    Equipment Recommendations  3 in 1 bedside commode    Recommendations for Other Services       Precautions / Restrictions Precautions Precautions: Fall Restrictions Weight Bearing Restrictions: No      Mobility Bed Mobility Overal bed mobility: Needs Assistance Bed Mobility: Rolling;Sidelying to Sit Rolling: Min guard Sidelying to sit: Min guard;HOB elevated       General bed mobility comments: pt received in chair  Transfers Overall transfer level: Needs assistance Equipment used: Rolling walker (2 wheeled) Transfers: Sit to/from [facility] Sit to Stand: Min assist Stand pivot transfers: Mod assist       General transfer comment: assist to rise and steady, flexed posture, cues for hand placement    Balance Overall balance assessment: Needs assistance Sitting-balance support: Feet supported;Single extremity supported Sitting balance-[name]: Fair Sitting  balance - Comments: at edge of chair   Standing balance support: During functional activity;Bilateral upper extremity supported Standing balance-[name]: Poor Standing balance comment: unable to release walker in static standing                           ADL either performed or assessed with clinical judgement   ADL Overall ADL's : Needs assistance/impaired Eating/Feeding: Set up;Sitting   Grooming: Min guard;Sitting   Upper Body Bathing: Minimal assistance;Sitting   Lower Body Bathing: Maximal assistance;Sit to/from stand   Upper Body Dressing : Minimal assistance;Sitting   Lower Body Dressing: Maximal assistance;Sit to/from stand   Toilet Transfer: Moderate assistance;Stand-pivot;RW   Toileting- Clothing Manipulation and Hygiene: Maximal assistance;Sit to/from stand         General ADL Comments: Pt unable to release walker in standing for ADL.     Vision Patient Visual Report: No change from baseline       Perception     Praxis      Pertinent Vitals/Pain Pain Assessment: No/denies pain     Hand Dominance Right   Extremity/Trunk Assessment Upper Extremity Assessment Upper Extremity Assessment: RUE deficits/detail RUE Deficits / Details: numbness of hand, longstanding RUE Sensation: decreased light touch RUE Coordination: decreased fine motor   Lower Extremity Assessment Lower Extremity Assessment: Defer to PT evaluation   Cervical / Trunk Assessment Cervical / Trunk Assessment: Normal   Communication Communication Communication: No difficulties   Cognition Arousal/Alertness: Awake/alert Behavior During Therapy: WFL for tasks assessed/performed Overall Cognitive Status: Within Functional Limits for tasks assessed  General Comments: pt oriented and able to offer PLOF and home set up   General Comments       Exercises     Shoulder Instructions      Home Living Family/patient expects  to be discharged to:: Private residence Living Arrangements: Children (daughter who is an amputee) Available Help at Discharge: Family;Available PRN/intermittently Type of Home: House Home Access: Level entry Entrance Stairs-Number of Steps: no steps into house, but has 12 steps into basement with bilateral rails, occasionally has to go into basement   Home Layout: Two level Alternate Level Stairs-Number of Steps: basement stairs  Alternate Level Stairs-Rails: Can reach both Bathroom Shower/Tub: Occupational psychologist: Standard     Home Equipment: Bedside commode;Walker - 2 wheels;Cane - single point          Prior Functioning/Environment Level of Independence: Independent with assistive device(s)        Comments: Works as a Teacher, 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         OT Problem List: Decreased strength;Decreased activity tolerance;Impaired balance (sitting and/or standing);Decreased knowledge of use of DME or AE      OT Treatment/Interventions: Self-care/ADL training;DME and/or AE instruction;Energy conservation;Therapeutic activities;Patient/family education;Balance training    OT Goals(Current goals can be found in the care plan section) Acute Rehab OT Goals Patient Stated Goal: go home OT Goal Formulation: With patient Time For Goal Achievement: 12/20/16 Potential to Achieve Goals: Good ADL Goals Pt Will Perform Grooming: with min guard assist;standing Pt Will Perform Upper Body Dressing: with supervision;sitting Pt Will Perform Lower Body Dressing: sit to/from stand;with adaptive equipment;with min assist Pt Will Transfer to Toilet: with min assist;ambulating;bedside commode Pt Will Perform Toileting - Clothing Manipulation and hygiene: with min assist;sit to/from stand Additional ADL Goal #1: Pt will recall at least 3 energy conservation strategies in ADL and mobility.  OT Frequency: Min 2X/week   Barriers to D/C:             Co-evaluation              AM-PAC PT "6 Clicks" Daily Activity     Outcome Measure Help from another person eating meals?: None Help from another person taking care of personal grooming?: A Little Help from another person toileting, which includes using toliet, bedpan, or urinal?: A Lot Help from another person bathing (including washing, rinsing, drying)?: A Lot Help from another person to put on and taking off regular upper body clothing?: A Little Help from another person to put on and taking off regular lower body clothing?: A Lot 6 Click Score: 16   End of Session Equipment Utilized During Treatment: Rolling walker;Gait belt  Activity Tolerance: Patient limited by fatigue Patient left: in chair;with call bell/phone within reach;with chair alarm set  OT Visit Diagnosis: Unsteadiness on feet (R26.81)                Time: 6314-9702 OT Time Calculation (min): 34 min Charges:  OT General Charges $OT Visit: 1 Visit OT Evaluation $OT Eval Moderate Complexity: 1 Mod OT Treatments $Self Care/Home Management : 8-22 mins G-Codes:     Malka So 12/06/2016, 1:27 PM  12/06/2016 Nestor Lewandowsky, OTR/L Pager: [phone]

## 2016-12-06 NOTE — Progress Notes (Signed)
CRITICAL VALUE ALERT  Critical Value:  INR 4.24  Date & Time Notied:  4:18am 10/2  Provider Notified: K Schorr  Orders Received/Actions taken: no additional orders given  [place]  Lolamae Voisin, RN

## 2016-12-06 NOTE — Progress Notes (Signed)
Progress Note  Patient Name: [name] Date of Encounter: 12/06/2016  Primary Cardiologist: Bronson Ing  Subjective   Does not feel much different. No dyspnea at rest. Feels swollen. Maybe some increase in UO last 24 h, still < 1L/day. Weight increased. Creat marginally better.  Inpatient Medications    Scheduled Meds: . ALPRAZolam  0.5 mg Oral QHS  . amiodarone  200 mg Oral BID  . Chlorhexidine Gluconate Cloth  6 each Topical Daily  . darbepoetin (ARANESP) injection - NON-DIALYSIS  100 mcg Subcutaneous Q Sun-1800  . feeding supplement (GLUCERNA SHAKE)  237 mL Oral BID BM  . fluticasone  1 spray Each Nare Daily  . insulin aspart  0-5 Units Subcutaneous QHS  . insulin aspart  0-9 Units Subcutaneous TID WC  . insulin glargine  15 Units Subcutaneous QHS  . isosorbide mononitrate  30 mg Oral Daily  . latanoprost  1 drop Both Eyes QHS  . metoprolol succinate  25 mg Oral Q breakfast  . pantoprazole  40 mg Oral Daily  . polyethylene glycol  17 g Oral BID  . rosuvastatin  10 mg Oral QHS  . sodium chloride flush  10-40 mL Intracatheter Q12H  . sodium chloride flush  3 mL Intravenous Q12H  . Warfarin - Pharmacist Dosing Inpatient   Does not apply q1800   Continuous Infusions: . sodium chloride    . ferumoxytol    . furosemide 120 mg (12/06/16 1734)   PRN Meds: sodium chloride, acetaminophen, albuterol, ondansetron (ZOFRAN) IV, sodium chloride flush, sodium chloride flush   Vital Signs    Vitals:   12/06/16 0733 12/06/16 0821 12/06/16 1215 12/06/16 1619  BP: (!) 152/64 132/67 131/65 136/67  Pulse: (!) 59 60 60 60  Resp: (!) 32  19 16  Temp: 98.6 F (37 C)  98.2 F (36.8 C) 98 F (36.7 C)  TempSrc: Oral  Oral Oral  SpO2: 100%  95% 98%  Weight:      Height:        Intake/Output Summary (Last 24 hours) at 12/06/16 1804 Last data filed at 12/06/16 1620  Gross per 24 hour  Intake              400 ml  Output             1100 ml  Net             -700 ml    Filed Weights   12/04/16 0501 12/05/16 0500 12/06/16 0500  Weight: 194 lb (88 kg) 206 lb 12.7 oz (93.8 kg) 209 lb 7 oz (95 kg)    Telemetry    NSR - Personally Reviewed  ECG    No new tracing - Personally Reviewed  Physical Exam  Awake, alert, responses appropriate, just a little delayed GEN: No acute distress.   Neck: hard to see JVD Cardiac: RRR, no murmurs, rubs, or gallops.  Respiratory: Clear to auscultation bilaterally. GI: Soft, nontender, non-distended  MS: 2+ generalized edema; No deformity. Neuro:  Nonfocal  Psych: Normal affect   Labs    Chemistry Recent Labs Lab 12/04/16 1011 12/05/16 0533 12/06/16 0335  NA 128* 131* 130*  K 4.4 4.4 4.1  CL 92* 93* 92*  CO2 23 29 27   GLUCOSE 289* 172* 184*  BUN 87* 94* 96*  CREATININE 4.39* 4.75* 4.65*  CALCIUM 8.3* 8.4* 8.4*  PROT 6.6  --   --   ALBUMIN 2.5*  --  2.3*  AST 51*  --   --  ALT 40  --   --   ALKPHOS 125  --   --   BILITOT 0.7  --   --   GFRNONAA 9* 8* 9*  GFRAA 11* 10* 10*  ANIONGAP 13 9 11      Hematology Recent Labs Lab 12/04/16 0003 12/05/16 0533 12/06/16 0335  WBC 7.0 5.0 5.7  RBC 2.82* 2.64* 2.74*  HGB 8.2* 7.7* 7.8*  HCT 26.2* 24.5* 25.5*  MCV 92.9 92.8 93.1  MCH 29.1 29.2 28.5  MCHC 31.3 31.4 30.6  RDW 17.4* 18.1* 17.5*  PLT 269 283 294    Cardiac Enzymes Recent Labs Lab 12/04/16 0004 12/04/16 1011  TROPONINI 7.82* 5.78*   No results for input(s): TROPIPOC in the last 168 hours.   BNP Recent Labs Lab 12/04/16 1820  BNP 2,222.9*     DDimer No results for input(s): DDIMER in the last 168 hours.   Radiology    Dg Chest 2 View  Result Date: 12/05/2016 CLINICAL DATA:  71 year old female with history of dyspnea. EXAM: CHEST  2 VIEW COMPARISON:  Chest x-ray 12/03/2016. FINDINGS: Persistent moderate right and small left pleural effusions. Bibasilar opacities (right greater than left), suggest superimposed areas of subsegmental atelectasis, although underlying  airspace consolidation is not entirely excluded. No evidence of pulmonary edema. Heart size is normal. Upper mediastinal contours are within normal limits. IMPRESSION: 1. Persistent moderate right and small left pleural effusions with associated areas of probable passive subsegmental atelectasis throughout the lung bases bilaterally. Electronically Signed   By: Vinnie Langton M.D.   On: 12/05/2016 07:53    Cardiac Studies   11/2016 nuclear stress: large inferolateral scar, LVEF 22%.  11/2015 cath: patent RCA stents, mod LAD disease, LCX CTO. Recs for medical thearpy.  11/2016 LVEF 30-35%, grade II diastolic dysfunciton  Patient Profile     71 y.o. 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 CHF:  markedly hypervolemic. Dr. Lorrene Reid notes plan to restart diuretics.  Avoid all RAAs inh. On reduced dose beta blocker. 2. CAD: denies angina: no significant are of viability/reversible ischemia. Risks of LCX intervention outweigh the benefit. Has abnormal cardiac enzymes, c/w another small NSTEMI, but no angina. No plan for invasive procedures. 3. PAF: on amiodarone, no arrhythmia last 48 h. 4. Warfarin: INR remains supratherapeutic (likely CHF related hepatic dysfunction as well as amiodarone interaction). 5. Ac on chr renal failure: minor improvement, at least not worsened. Background of DM/HTN nephropathy, baseline creat in 2s.   For questions or updates, please contact [place] Please consult [email or web address] for contact info under Cardiology/STEMI.      Signed, Sanda Klein, MD  12/06/2016, 6:04 PM

## 2016-12-06 NOTE — Progress Notes (Signed)
[facility] for warfarin Indication: atrial fibrillation  Allergies  Allergen Reactions  . Ace Inhibitors Cough  . Amlodipine Other (See Comments)    Edema   . Codeine Rash    Patient Measurements: Height: 5\' 8"  (172.7 cm) Weight: 209 lb 7 oz (95 kg) IBW/kg (Calculated) : 63.9  Assessment: 71 YOF transferred from [facility] on 9/29. On Coumadin alternating 7.5mg  and 3.75mg  every other day PTA for Afib per records from [place] = 5. Has been on clindamycin and trimethoprim recently. Also on amiodarone. INR was elevated on admit and now trending down to 4.24. Hgb mostly stable but low at 7.8, plts wnl  Goal of Therapy:  INR 2-3 Monitor platelets by anticoagulation protocol: Yes   Plan:  Hold warfarin tonight Monitor daily INR, CBC, s/s of bleed   Elenor Quinones, PharmD, [facility] Clinical Pharmacist Pager [phone] 12/06/2016 7:45 AM

## 2016-12-06 NOTE — Care Management Note (Addendum)
Case Management Note  Patient Details  Name: SIMCHA FARRINGTON MRN: [id number] Date of Birth: 07/18/1945  Subjective/Objective:   From home, transferred from [facility]  For c/o SOB and aucte on chronic Heart Failure and acute on chronic kidney disease, afib, She was active with [facility] for HHRN/PT.    10/5 [place], BSN - discussed in LOS, was decided as [place], but [place] does not go to [place].   Per pt eval rec SNF for patient, which may be better for her because her insurance has given [facility] only five RN, visits and 5 PT visits.  Patient states she would like to go to [facility] in [place].  CSW working on placement.  Per [place] is not in net work and patient does not want to go to [facility] or any other facility, she now wants to go home.  [facility] will resume HHRN/PT at discharge, they will also check with her insurance to see if she is able to get more visits.  [facility] will need dc summary and current med list at discharge.  MD will also need to put resumption orders in for [facility], HHPT.                      Action/Plan: NCM will follow for dc needs.   Expected Discharge Date:                  Expected Discharge Plan:  [place]  In-House Referral:     Discharge planning Services  CM Consult  Post Acute Care Choice:  Home Health, Resumption of Svcs/PTA Provider Choice offered to:     DME Arranged:    DME Agency:     HH Arranged:  RN, PT [place] Agency:  [facility]  Status of Service:  In process, will continue to follow  If discussed at Long Length of Stay Meetings, dates discussed:    Additional Comments:  Zenon Mayo, RN 12/06/2016, 10:50 PM

## 2016-12-06 NOTE — Progress Notes (Signed)
[facility] Rounding Note  Subjective/Interval History:  Up in the chair No new complaints "Swollen" Remains sinus brady Some UOP  Objective Vital signs in last 24 hours: Vitals:   12/06/16 0600 12/06/16 0733 12/06/16 0821 12/06/16 1215  BP: (!) 145/71 (!) 152/64 132/67 131/65  Pulse: (!) 57 (!) 59 60 60  Resp: 19 (!) 32  19  Temp:  98.6 F (37 C)  98.2 F (36.8 C)  TempSrc:  Oral  Oral  SpO2: 100% 100%  95%  Weight:      Height:       Weight change: 1.2 kg (2 lb 10.3 oz)  Intake/Output Summary (Last 24 hours) at 12/06/16 1456 Last data filed at 12/06/16 0904  Gross per 24 hour  Intake              400 ml  Output             1250 ml  Net             -850 ml   Physical Exam:  Blood pressure 131/65, pulse 60, temperature 98.2 F (36.8 C), temperature source Oral, resp. rate 19, height 5\' 8"  (1.727 m), weight 95 kg (209 lb 7 oz), SpO2 95 %.  Awake, alert. A little slower to respond than usual (I know her well from outpt) VS as noted Lungs grossly clear S1S2 No S3 Sinus brady Abd soft and not tender 2+ pitting edema both LE's No asterixus  Recent Labs Lab 12/04/16 0003 12/04/16 1011 12/05/16 0533 12/06/16 0335  NA 128* 128* 131* 130*  K 4.3 4.4 4.4 4.1  CL 92* 92* 93* 92*  CO2 24 23 29 27   GLUCOSE 301* 289* 172* 184*  BUN 83* 87* 94* 96*  CREATININE 4.28* 4.39* 4.75* 4.65*  CALCIUM 8.4* 8.3* 8.4* 8.4*  PHOS  --   --   --  5.7*    Recent Labs Lab 12/04/16 1011 12/06/16 0335  AST 51*  --   ALT 40  --   ALKPHOS 125  --   BILITOT 0.7  --   PROT 6.6  --   ALBUMIN 2.5* 2.3*     Recent Labs Lab 12/04/16 0003 12/05/16 0533 12/06/16 0335  WBC 7.0 5.0 5.7  NEUTROABS  --  3.7 4.4  HGB 8.2* 7.7* 7.8*  HCT 26.2* 24.5* 25.5*  MCV 92.9 92.8 93.1  PLT 269 283 294    Recent Labs Lab 12/04/16 0004 12/04/16 1011  TROPONINI 7.82* 5.78*   Lab Results  Component Value Date   INR 4.24 (HH) 12/06/2016   INR 4.51 (HH) 12/05/2016   INR 6.71 (HH) 12/04/2016      Recent Labs Lab 12/05/16 1146 12/05/16 1714 12/05/16 2149 12/06/16 0731 12/06/16 1214  GLUCAP 182* 176* 274* 142* 153*   Iron/TIBC/Ferritin/ %Sat    Component Value Date/Time   IRON 23 (L) 12/06/2016 0335   TIBC 276 12/06/2016 0335   IRONPCTSAT 8 (L) 12/06/2016 0335   Studies/Results: Dg Chest 2 View  Result Date: 12/05/2016 CLINICAL DATA:  71 year old female with history of dyspnea. EXAM: CHEST  2 VIEW COMPARISON:  Chest x-ray 12/03/2016. FINDINGS: Persistent moderate right and small left pleural effusions. Bibasilar opacities (right greater than left), suggest superimposed areas of subsegmental atelectasis, although underlying airspace consolidation is not entirely excluded. No evidence of pulmonary edema. Heart size is normal. Upper mediastinal contours are within normal limits. IMPRESSION: 1. Persistent moderate right and small left pleural effusions with associated areas of probable passive subsegmental  atelectasis throughout the lung bases bilaterally. Electronically Signed   By: Vinnie Langton M.D.   On: 12/05/2016 07:53   Medications: . sodium chloride     . ALPRAZolam  0.5 mg Oral QHS  . amiodarone  200 mg Oral BID  . Chlorhexidine Gluconate Cloth  6 each Topical Daily  . darbepoetin (ARANESP) injection - NON-DIALYSIS  100 mcg Subcutaneous Q Sun-1800  . feeding supplement (GLUCERNA SHAKE)  237 mL Oral BID BM  . ferrous sulfate  325 mg Oral Q breakfast  . fluticasone  1 spray Each Nare Daily  . insulin aspart  0-5 Units Subcutaneous QHS  . insulin aspart  0-9 Units Subcutaneous TID WC  . insulin glargine  15 Units Subcutaneous QHS  . isosorbide mononitrate  30 mg Oral Daily  . latanoprost  1 drop Both Eyes QHS  . metoprolol succinate  25 mg Oral Q breakfast  . pantoprazole  40 mg Oral Daily  . polyethylene glycol  17 g Oral BID  . rosuvastatin  10 mg Oral QHS  . sodium chloride flush  10-40 mL Intracatheter Q12H  . sodium chloride flush  3 mL Intravenous  Q12H  . Warfarin - Pharmacist Dosing Inpatient   Does not apply q1800    Background: 71 y.o. 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 Lorrene Reid - BL creatinine mid 2's) admitted with SOB and fluid overload initially to [facility].   Assessment/Recommendations  1. 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 DM, HTN, OAB issues. BL creatinine usual mid to low 2's (2.0  as recently as 11/27/16) with AKI on CKD in setting of AFib, large doses toprol, low HR and BP. I believe much of this is hemodynamically driven. Toprol dose reduced to 25. Still brady but BP up enough now to be able to add back diuretics for volume overload. Most recent outpt dose of torsemide was 80 BID (which worked when creatinine in the 2's, not sure will be effective now). Will start with some IV lasix and if diureses then can transition back to po torsemide. She is not clinically uremic but understands that renal function could go in the wrong direction and could require HD.  2. Anemia  - has had good outpt Hb's. Has required Feraheme in the past, never Aranesp. Started that here. 100 mcg/week (1st dosed 9/30). Fe quite low - Feraheme ordered. Would personally have low threshold to transfuse given her CAD.  3. CAD - + trops falling. Several prior interventions. No plans for cath so far this trip. Risk outweighs benefit per cards.  4. DM - per primary service 5. Supratherapeutic INR - pharmacy monitoring.   Jamal Maes, MD [facility] [phone] pager 12/06/2016, 2:56 PM

## 2016-12-06 NOTE — Progress Notes (Signed)
Inpatient Diabetes Program Recommendations  AACE/ADA: New Consensus Statement on Inpatient Glycemic Control (2015)  Target Ranges:  Prepandial:   less than 140 mg/dL      Peak postprandial:   less than 180 mg/dL (1-2 hours)      Critically ill patients:  140 - 180 mg/dL   Lab Results  Component Value Date   GLUCAP 142 (H) 12/06/2016   HGBA1C 8.8 (H) 10/31/2016    Review of Glycemic Control  Results for [name], [name] (MRN [id number]) as of 12/06/2016 12:11  Ref. Range 12/05/2016 07:57 12/05/2016 11:46 12/05/2016 17:14 12/05/2016 21:49 12/06/2016 07:31  Glucose-Capillary Latest Ref Range: 65 - 99 mg/dL 151 (H) 182 (H) 176 (H) 274 (H) 142 (H)   Diabetes history: DM2 Outpatient Diabetes medications: Lantus 30 units QHS, Humalog 15-21 units TID with meals  Current orders for Inpatient glycemic control: Lantus 15 units QHS, Novolog 0-9 units TID with meals, Novolog 0-5 units QHS  Inpatient Diabetes Program Recommendations: Insulin - Meal Coverage: Post prandial blood sugars elevated- Please consider ordering Novolog 4 units TID with meals for meal coverage if patient eats at least 50% of meals.  Gentry Fitz, RN, BA, MHA, CDE Diabetes Coordinator Inpatient Diabetes Program  [phone] (Team Pager) [phone] ([place]) 12/06/2016 12:13 PM

## 2016-12-06 NOTE — Patient Outreach (Signed)
[facility]) Care Management  12/06/2016  [name] Jan 11, 1946 [id number]   Care coordination   12/05/16 [facility] CM and [name] liaison collaborated about [facility] CM services for [name] CM has called [name] but without being able to reach her. Last attempt to contact was again at her home number on 12/01/16 at 1704 without and answer  Previous messages left at home number, with husband and at daughter number without response [name] was noted per EPIC on 12/05/16 to have returned to [name] as inpatient on 12/03/16 from [facility] with chief complaint acute on chronic heart failure as well as acute on chronic renal failure and elevated INR Her hospitalization prior to that was an.Admit date: 11/19/2016 Discharge date: 11/27/2016 She has been admitted x 7 in the last 6 months She was followed by [facility] CM in June 2018 but with difficulty returning or responding to calls.  She agreed to discharge from [facility] services in August 2018.  She is now agreeing to re start [facility] CM services.   Plans [facility] CM will re engage with [name], follow for d/c needs/transition of care and MD office visits  Routed note to pcp  [name] request assistance from [facility] liaison with updated contact information prior to discharge  [name]. Lavina Hamman, RN, BSN, [place] Care Management [phone]

## 2016-12-06 NOTE — Progress Notes (Signed)
Patient ID: [name], female   DOB: 05-31-1945, 71 y.o.   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 Fib started on anticoagulation, recent acute on chronic heart failure and pneumonia for which she needed hospitalization and thoracentesis was transferred from [facility] for complaints of worsening shortness of breath. She was admitted with acute on chronic heart failure and acute on chronic kidney disease.   Assessment & Plan:   Principal Problem:   Acute on chronic combined systolic and diastolic CHF (congestive heart failure) (HCC) Active Problems:   Essential hypertension   Coronary artery disease   GERD   Elevated troponin   Acute renal failure superimposed on chronic kidney disease (HCC)   Acute respiratory failure with hypoxia (HCC)   DM type 2 causing vascular disease (HCC)   Pleural effusion on right   Anemia in chronic kidney disease   Atrial fibrillation (HCC)   Ischemic cardiomyopathy   Hypercoagulopathy (HCC)   Acute on chronic combined systolic and diastolic heart failure with recent echo showing ejection fraction of 30-35% and grade 2 diastolic dysfunction: - She also had a nuclear stress test on September 11 revealing a large scar consistent with prior infarct but no ischemic territories. - cardiology following. Diuretics on hold because of renal function. Continue Imdur, Toprol, amiodarone. Follow further cardiology recommendations. -Daily weights -strict Intake and output - patient has had recent multiple admissions. Palliative care consult for goals of care  Intake/Output  Summary (Last 24 hours) at 12/06/16 0901 Last data filed at 12/06/16 0251  Gross per 24 hour  Intake              400 ml  Output              950 ml  Net             -550 ml   Filed Weights   12/04/16 0501 12/05/16 0500 12/06/16 0500  Weight: 88 kg (194 lb) 93.8 kg (206 lb 12.7 oz) 95 kg (209 lb 7 oz)    Acute renal failure superimposed on chronic kidney disease stage IV - creatinine has slightly improved to 4.65 today. Nephrology following. Follow recommendations. -Monitor urine output -Hold nephrotoxins as able  Acute respiratory failure with hypoxia.  - Resolved on admission. Patient did require BiPAP 24 hours ago. -Continue oxygen supplementation as needed   Elevated INR. - INR 4.24 today. No signs of bleeding. Hold Coumadin. Repeat INR in a.m.   Recent diagnosis of A. Fib. [place] vasc score5 - currently in sinus rhythm with bradycardia. Continue amiodarone, Toprol. Follow cardiology recommendations  CAD with stents/elevated troponins. No chest pain. Elevated troponins presumably related to demand ischemia. -Cardiology following. Continue metoprolol, Imdur and rosuvastatin   Diabetes mellitus type II - continue Lantus and SSI    Pleural effusion - Recent thoracentesis where 1.7 L was removed. Chart review indicates fluid was thought to be a transudate from CHF. Cytology was negative for malignancy and revealed reactive mesothelial cells only - chest x-ray from yesterday showed persistent moderate right pleural effusion and some left-sided pleural effusion. If pleural effusion persists, she might need thoracentesis again once INR is  improved. We will repeat chest x-ray for tomorrow   Subclavian artery occlusion. Chart review indicates patient underwent thrombectomy on May 2017 and again in July 2018.   Anemia of chronic disease.  - hemoglobin stable. Monitor    DVT prophylaxis: SCDs Code Status:  full Family Communication: none at bedside Disposition  Plan:depends on clinical improvement  Consultants: cardiology and nephrology  Procedures: none  Antimicrobials: none    Subjective: Patient seen and examined at bedside. She feels slightly better this morning but still complains of intermittent shortness of breath but no current chest pain. No fever,nausea or vomiting.  Objective: Vitals:   12/06/16 0500 12/06/16 0600 12/06/16 0733 12/06/16 0821  BP: (!) 144/65 (!) 145/71 (!) 152/64 132/67  Pulse: (!) 59 (!) 57 (!) 59 60  Resp: 14 19 (!) 32   Temp:   98.6 F (37 C)   TempSrc:   Oral   SpO2: 100% 100% 100%   Weight: 95 kg (209 lb 7 oz)     Height:        Intake/Output Summary (Last 24 hours) at 12/06/16 0901 Last data filed at 12/06/16 0251  Gross per 24 hour  Intake              400 ml  Output              950 ml  Net             -550 ml   Filed Weights   12/04/16 0501 12/05/16 0500 12/06/16 0500  Weight: 88 kg (194 lb) 93.8 kg (206 lb 12.7 oz) 95 kg (209 lb 7 oz)    Examination:  General exam: Appears calm and comfortable but older than stated age Respiratory system: Bilateral decreased breath sound at bases with scattered basilar crackles Cardiovascular system: S1 & S2 heard,intermittent bradycardia Gastrointestinal: soft, nontender bowel sounds positive Extremities: No cyanosis, clubbing; bilateral lower extremity 1-2+ edema  Data Reviewed: I have personally reviewed following labs and imaging studies  CBC:  Recent Labs Lab 12/04/16 0003 12/05/16 0533 12/06/16 0335  WBC 7.0 5.0 5.7  NEUTROABS  --  3.7 4.4  HGB 8.2* 7.7* 7.8*  HCT 26.2* 24.5* 25.5*  MCV 92.9 92.8 93.1  PLT 269 283 297   Basic Metabolic Panel:  Recent Labs Lab 12/04/16 0003 12/04/16 1011 12/05/16 0533 12/06/16 0335  NA 128* 128* 131* 130*  K 4.3 4.4 4.4 4.1  CL 92* 92* 93* 92*  CO2 24 23 29 27   GLUCOSE 301* 289* 172* 184*  BUN 83* 87* 94* 96*  CREATININE 4.28* 4.39* 4.75* 4.65*  CALCIUM 8.4* 8.3* 8.4* 8.4*  MG  --   --   2.7* 2.7*  PHOS  --   --   --  5.7*   GFR: Estimated Creatinine Clearance: 13.2 mL/min (A) (by C-G formula based on SCr of 4.65 mg/dL (H)). Liver Function Tests:  Recent Labs Lab 12/04/16 1011 12/06/16 0335  AST 51*  --   ALT 40  --   ALKPHOS 125  --   BILITOT 0.7  --   PROT 6.6  --   ALBUMIN 2.5* 2.3*   No results for input(s): LIPASE, AMYLASE in the last 168 hours. No results for input(s): AMMONIA in the last 168 hours. Coagulation Profile:  Recent Labs Lab 12/04/16 0003 12/05/16 0533 12/06/16 0335  INR 6.71* 4.51* 4.24*   Cardiac Enzymes:  Recent Labs Lab 12/04/16 0004 12/04/16 1011  TROPONINI 7.82* 5.78*   BNP (last 3  results) No results for input(s): PROBNP in the last 8760 hours. HbA1C: No results for input(s): HGBA1C in the last 72 hours. CBG:  Recent Labs Lab 12/05/16 0757 12/05/16 1146 12/05/16 1714 12/05/16 2149 12/06/16 0731  GLUCAP 151* 182* 176* 274* 142*   Lipid Profile: No results for input(s): CHOL, HDL, LDLCALC, TRIG, CHOLHDL, LDLDIRECT in the last 72 hours. Thyroid Function Tests: No results for input(s): TSH, T4TOTAL, FREET4, T3FREE, THYROIDAB in the last 72 hours. Anemia Panel:  Recent Labs  12/06/16 0335  TIBC 276  IRON 23*   Sepsis Labs: No results for input(s): PROCALCITON, LATICACIDVEN in the last 168 hours.  Recent Results (from the past 240 hour(s))  MRSA PCR Screening     Status: None   Collection Time: 12/03/16 10:41 AM  Result Value Ref Range Status   MRSA by PCR NEGATIVE NEGATIVE Final    Comment:        The GeneXpert MRSA Assay (FDA approved for NASAL specimens only), is one component of a comprehensive MRSA colonization surveillance program. It is not intended to diagnose MRSA infection nor to guide or monitor treatment for MRSA infections.          Radiology Studies: Dg Chest 2 View  Result Date: 12/05/2016 CLINICAL DATA:  71 year old female with history of dyspnea. EXAM: CHEST  2 VIEW  COMPARISON:  Chest x-ray 12/03/2016. FINDINGS: Persistent moderate right and small left pleural effusions. Bibasilar opacities (right greater than left), suggest superimposed areas of subsegmental atelectasis, although underlying airspace consolidation is not entirely excluded. No evidence of pulmonary edema. Heart size is normal. Upper mediastinal contours are within normal limits. IMPRESSION: 1. Persistent moderate right and small left pleural effusions with associated areas of probable passive subsegmental atelectasis throughout the lung bases bilaterally. Electronically Signed   By: Vinnie Langton M.D.   On: 12/05/2016 07:53        Scheduled Meds: . ALPRAZolam  0.5 mg Oral QHS  . amiodarone  200 mg Oral BID  . Chlorhexidine Gluconate Cloth  6 each Topical Daily  . darbepoetin (ARANESP) injection - NON-DIALYSIS  100 mcg Subcutaneous Q Sun-1800  . ferrous sulfate  325 mg Oral Q breakfast  . fluticasone  1 spray Each Nare Daily  . insulin aspart  0-5 Units Subcutaneous QHS  . insulin aspart  0-9 Units Subcutaneous TID WC  . insulin glargine  15 Units Subcutaneous QHS  . isosorbide mononitrate  30 mg Oral Daily  . latanoprost  1 drop Both Eyes QHS  . metoprolol succinate  25 mg Oral Q breakfast  . pantoprazole  40 mg Oral Daily  . rosuvastatin  10 mg Oral QHS  . sodium chloride flush  10-40 mL Intracatheter Q12H  . sodium chloride flush  3 mL Intravenous Q12H  . Warfarin - Pharmacist Dosing Inpatient   Does not apply q1800   Continuous Infusions: . sodium chloride       LOS: 3 days        Aline August, MD [facility] Pager [phone]  If 7PM-7AM, please contact night-coverage [email or web address] Password [email or web address] 12/06/2016, 9:01 AM

## 2016-12-07 ENCOUNTER — Inpatient Hospital Stay (HOSPITAL_COMMUNITY): Payer: BLUE CROSS/BLUE SHIELD

## 2016-12-07 DIAGNOSIS — N170 Acute kidney failure with tubular necrosis: Secondary | ICD-10-CM

## 2016-12-07 DIAGNOSIS — I214 Non-ST elevation (NSTEMI) myocardial infarction: Secondary | ICD-10-CM

## 2016-12-07 LAB — GLUCOSE, CAPILLARY
GLUCOSE-CAPILLARY: 143 mg/dL — AB (ref 65–99)
GLUCOSE-CAPILLARY: 204 mg/dL — AB (ref 65–99)
GLUCOSE-CAPILLARY: 202 mg/dL — AB (ref 65–99)
Glucose-Capillary: 173 mg/dL — ABNORMAL HIGH (ref 65–99)
Glucose-Capillary: 218 mg/dL — ABNORMAL HIGH (ref 65–99)
Glucose-Capillary: 302 mg/dL — ABNORMAL HIGH (ref 65–99)

## 2016-12-07 LAB — CBC WITH DIFFERENTIAL/PLATELET
Basophils Absolute: 0 10*3/uL (ref 0.0–0.1)
Basophils Relative: 0 %
EOS ABS: 0.1 10*3/uL (ref 0.0–0.7)
Eosinophils Relative: 1 %
HCT: 24.8 % — ABNORMAL LOW (ref 36.0–46.0)
Hemoglobin: 8.1 g/dL — ABNORMAL LOW (ref 12.0–15.0)
LYMPHS ABS: 0.8 10*3/uL (ref 0.7–4.0)
LYMPHS PCT: 15 %
MCH: 30.1 pg (ref 26.0–34.0)
MCHC: 32.7 g/dL (ref 30.0–36.0)
MCV: 92.2 fL (ref 78.0–100.0)
MONO ABS: 0.5 10*3/uL (ref 0.1–1.0)
Monocytes Relative: 9 %
Neutro Abs: 4.2 10*3/uL (ref 1.7–7.7)
Neutrophils Relative %: 75 %
PLATELETS: 294 10*3/uL (ref 150–400)
RBC: 2.69 MIL/uL — ABNORMAL LOW (ref 3.87–5.11)
RDW: 17.8 % — AB (ref 11.5–15.5)
WBC: 5.6 10*3/uL (ref 4.0–10.5)

## 2016-12-07 LAB — RENAL FUNCTION PANEL
ANION GAP: 12 (ref 5–15)
Albumin: 2.2 g/dL — ABNORMAL LOW (ref 3.5–5.0)
BUN: 88 mg/dL — ABNORMAL HIGH (ref 6–20)
CALCIUM: 8.5 mg/dL — AB (ref 8.9–10.3)
CHLORIDE: 94 mmol/L — AB (ref 101–111)
CO2: 28 mmol/L (ref 22–32)
CREATININE: 4.21 mg/dL — AB (ref 0.44–1.00)
GFR calc Af Amer: 11 mL/min — ABNORMAL LOW (ref >60)
GFR calc non Af Amer: 10 mL/min — ABNORMAL LOW (ref >60)
GLUCOSE: 124 mg/dL — AB (ref 65–99)
Phosphorus: 5.3 mg/dL — ABNORMAL HIGH (ref 2.5–4.6)
Potassium: 3.8 mmol/L (ref 3.5–5.1)
Sodium: 134 mmol/L — ABNORMAL LOW (ref 135–145)

## 2016-12-07 LAB — PROTIME-INR
INR: 3.48
Prothrombin Time: 34.7 seconds — ABNORMAL HIGH (ref 11.4–15.2)

## 2016-12-07 LAB — MAGNESIUM: MAGNESIUM: 2.6 mg/dL — AB (ref 1.7–2.4)

## 2016-12-07 NOTE — Progress Notes (Signed)
PROGRESS NOTE        PATIENT DETAILS Name: [name] Age: 71 y.o. Sex: female Date of Birth: April 05, 1945 Admit Date: 12/03/2016 Admitting Physician Debbe Odea, MD ZOX:[id number], Berneta Sages, MD  Brief Narrative: Patient is a 71 y.o. female with history of CAD status post non-STEMI in 2017-status post PCI, chronic systolic/diastolic heart failure, right subclavian stenosis status post thrombectomy in 2017 and July 2018 admitted with decompensated heart failure and worsening renal function. See below for further details  Subjective: Breathing is much better-she's tired of being in and out of hospital recently.   Assessment/Plan: Principal Problem: Acute on chronic combined systolic and diastolic CHF (EF 45-40% on 11/15/16): Still appears to have Axis IV-but apparently has improved, continue diuretics per nephrology.  Acute on chronic kidney disease stage III: Acute kidney injury thought to be hemodynamically mediated-slowly improving with supportive care. Appreciate nephrology assistance.  Acute respiratory failure with hypoxia: Likely related to decompensated heart failure-resolved with diuretics.  Paroxysmal Atrial fibrillation: Currently in sinus rhythm-continue amiodarone, Toprol-chadsvasc score of around 5-INR status slightly supratherapeutic-defer to  Pharmacy.  Non-STEMI: Troponin significantly elevated-a recent nuclear stress test on 9/11 without ischemia-has known history of underlying CAD-remains on metoprolol, statin-suspect not on antiplatelets-as on anticoagulation. Cardiology following. Per cardiology note-now plan for invasive procedures at this time.  History of CAD status post PCI: Minimally elevated troponins-suspect related to demand ischemia-trend is flat and not consistent with ACS. History of PCI in April, 2017 following a non-STEMI.  History of pleural effusion: Reviewed discharge summary on 9/23-apparently patient had undergone a recent  thoracocentesis-pleural effusion was thought to be secondary to transudate. Currently hypoxia is improved, volume status is improving with diuretics-do not see any indication for thoracocentesis at this time. Pleural effusion is likely related to CHF.  Insulin-dependent DM-2: CBG's stable-continue Lantus/SSI  Anemia:probably 2/2 CKD-on Feraheme/Aranesp per Renal.    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  MEDICATIONS: Scheduled Meds: . ALPRAZolam  0.5 mg Oral QHS  . amiodarone  200 mg Oral BID  . Chlorhexidine Gluconate Cloth  6 each Topical Daily  . darbepoetin (ARANESP) injection - NON-DIALYSIS  100 mcg Subcutaneous Q Sun-1800  . feeding supplement (GLUCERNA SHAKE)  237 mL Oral BID BM  . fluticasone  1 spray Each Nare Daily  . insulin aspart  0-5 Units Subcutaneous QHS  . insulin aspart  0-9 Units Subcutaneous TID WC  . insulin glargine  15 Units Subcutaneous QHS  . isosorbide mononitrate  30 mg Oral Daily  . latanoprost  1 drop Both Eyes QHS  . metoprolol succinate  25 mg Oral Q breakfast  . pantoprazole  40 mg Oral Daily  . polyethylene glycol  17 g Oral BID  . rosuvastatin  10 mg Oral QHS  . sodium chloride flush  10-40 mL Intracatheter Q12H  .  sodium chloride flush  3 mL Intravenous Q12H  . Warfarin - Pharmacist Dosing Inpatient   Does not apply  q1800   Continuous Infusions: . sodium chloride    . ferumoxytol Stopped (12/06/16 1900)  . furosemide Stopped (12/07/16 0609)   PRN Meds:.sodium chloride, acetaminophen, albuterol, guaiFENesin-dextromethorphan, ondansetron (ZOFRAN) IV, sodium chloride flush, sodium chloride flush   PHYSICAL EXAM: Vital signs: Vitals:   12/07/16 0600 12/07/16 0700 12/07/16 0729 12/07/16 0800  BP: 129/60 (!) 130/53 (!) 132/94 137/80  Pulse: 70 70 72 73  Resp: 13 12 16  (!) 21  Temp:   97.8 F (36.6 C)   TempSrc:   Oral   SpO2: 97% 97% 97% 98%  Weight:      Height:       Filed Weights   12/06/16 0500 12/07/16 0404 12/07/16 0451  Weight: 95 kg (209 lb 7 oz) 95.8 kg (211 lb 3.2 oz) 94.2 kg (207 lb 10.8 oz)   Body mass index is 31.58 kg/m.   General appearance :Awake, alert, not in any distress. Speech Clear. Not toxic Looking Eyes:, pupils equally reactive to light and accomodation,no scleral icterus.Pink conjunctiva HEENT: Atraumatic and Normocephalic Neck: supple. No cervical lymphadenopathy. No thyromegaly Resp:Good air entry bilaterally-Few bibasilar rales CVS: S1 S2 regular, no murmurs.  GI: Bowel sounds present, Non tender and not distended with no gaurding, rigidity or rebound.No organomegaly Extremities: B/L Lower Ext shows ++ edema, both legs are warm to touch Neurology:  speech clear,Non focal, sensation is grossly intact. Psychiatric: Normal judgment and insight. Alert and oriented x 3. Normal mood. Musculoskeletal:No digital cyanosis Skin:No Rash, warm and dry Wounds:N/A  I have personally reviewed following labs and imaging studies  LABORATORY DATA: CBC:  Recent Labs Lab 12/04/16 0003 12/05/16 0533 12/06/16 0335 12/07/16 0500  WBC 7.0 5.0 5.7 5.6  NEUTROABS  --  3.7 4.4 4.2  HGB 8.2* 7.7* 7.8* 8.1*  HCT 26.2* 24.5* 25.5* 24.8*  MCV 92.9 92.8 93.1 92.2  PLT 269 283 294 852    Basic Metabolic Panel:  Recent Labs Lab 12/04/16 0003 12/04/16 1011 12/05/16 0533  12/06/16 0335 12/07/16 0500  NA 128* 128* 131* 130* 134*  K 4.3 4.4 4.4 4.1 3.8  CL 92* 92* 93* 92* 94*  CO2 24 23 29 27 28   GLUCOSE 301* 289* 172* 184* 124*  BUN 83* 87* 94* 96* 88*  CREATININE 4.28* 4.39* 4.75* 4.65* 4.21*  CALCIUM 8.4* 8.3* 8.4* 8.4* 8.5*  MG  --   --  2.7* 2.7* 2.6*  PHOS  --   --   --  5.7* 5.3*    GFR: Estimated Creatinine Clearance: 14.5 mL/min (A) (by C-G formula based on SCr of 4.21 mg/dL (H)).  Liver Function Tests:  Recent Labs Lab 12/04/16 1011 12/06/16 0335 12/07/16 0500  AST 51*  --   --   ALT 40  --   --   ALKPHOS 125  --   --   BILITOT 0.7  --   --   PROT 6.6  --   --   ALBUMIN 2.5* 2.3* 2.2*   No results for input(s): LIPASE, AMYLASE in the last 168 hours. No results for input(s): AMMONIA in the last 168 hours.  Coagulation Profile:  Recent Labs Lab 12/04/16 0003 12/05/16 0533 12/06/16 0335 12/07/16 0500  INR 6.71* 4.51* 4.24* 3.48    Cardiac Enzymes:  Recent Labs Lab 12/04/16 0004 12/04/16 1011  TROPONINI 7.82* 5.78*    BNP (last 3 results) No results for  input(s): PROBNP in the last 8760 hours.  HbA1C: No results for input(s): HGBA1C in the last 72 hours.  CBG:  Recent Labs Lab 12/06/16 1616 12/06/16 2039 12/07/16 0729 12/07/16 1127 12/07/16 1210  GLUCAP 193* 282* 143* 173* 204*    Lipid Profile: No results for input(s): CHOL, HDL, LDLCALC, TRIG, CHOLHDL, LDLDIRECT in the last 72 hours.  Thyroid Function Tests: No results for input(s): TSH, T4TOTAL, FREET4, T3FREE, THYROIDAB in the last 72 hours.  Anemia Panel:  Recent Labs  12/06/16 0335  TIBC 276  IRON 23*    Urine analysis:    Component Value Date/Time   COLORURINE YELLOW 12/04/2016 1425   APPEARANCEUR TURBID (A) 12/04/2016 1425   LABSPEC 1.013 12/04/2016 1425   PHURINE 5.0 12/04/2016 1425   GLUCOSEU NEGATIVE 12/04/2016 1425   HGBUR LARGE (A) 12/04/2016 1425   BILIRUBINUR NEGATIVE 12/04/2016 1425   KETONESUR NEGATIVE 12/04/2016  1425   PROTEINUR 100 (A) 12/04/2016 1425   UROBILINOGEN 0.2 08/23/2007 2037   NITRITE NEGATIVE 12/04/2016 1425   LEUKOCYTESUR LARGE (A) 12/04/2016 1425    Sepsis Labs: Lactic Acid, Venous    Component Value Date/Time   LATICACIDVEN 0.65 10/31/2016 0838    MICROBIOLOGY: Recent Results (from the past 240 hour(s))  MRSA PCR Screening     Status: None   Collection Time: 12/03/16 10:41 AM  Result Value Ref Range Status   MRSA by PCR NEGATIVE NEGATIVE Final    Comment:        The GeneXpert MRSA Assay (FDA approved for NASAL specimens only), is one component of a comprehensive MRSA colonization surveillance program. It is not intended to diagnose MRSA infection nor to guide or monitor treatment for MRSA infections.     RADIOLOGY STUDIES/RESULTS: Dg Chest 1 View  Result Date: 11/21/2016 CLINICAL DATA:  Pleural effusions, right greater than left. EXAM: CHEST 1 VIEW COMPARISON:  11/20/2016 FINDINGS: Right pleural effusion has been eliminated. There is a moderate left effusion which appears slightly more prominent than on the prior study. Overall heart size and pulmonary vascularity are normal. No pneumothorax after thoracentesis. IMPRESSION: No pneumothorax after right thoracentesis. No visible residual right pleural fluid. Moderate left effusion appears slightly increased. Electronically Signed   By: Lorriane Shire M.D.   On: 11/21/2016 13:08   Dg Chest 2 View  Result Date: 12/07/2016 CLINICAL DATA:  71 year old female with shortness of breath. Admitted with acute on chronic heart failure. EXAM: CHEST  2 VIEW COMPARISON:  12/05/2016 and earlier. FINDINGS: AP and lateral views of the chest. Continued moderate-sized veiling opacity at both lung bases and fluid tracking in the pleural fissures, including the right minor fissure. Stable pulmonary vascularity, no overt edema. No pneumothorax. Stable cardiac size and mediastinal contours. No acute osseous abnormality identified. Paucity of  bowel gas in the upper abdomen. IMPRESSION: 1. Continued moderate bilateral pleural effusions with bibasilar collapse or consolidation. 2. Stable pulmonary vascularity without overt edema. Electronically Signed   By: Genevie Ann M.D.   On: 12/07/2016 08:19   Dg Chest 2 View  Result Date: 12/05/2016 CLINICAL DATA:  71 year old female with history of dyspnea. EXAM: CHEST  2 VIEW COMPARISON:  Chest x-ray 12/03/2016. FINDINGS: Persistent moderate right and small left pleural effusions. Bibasilar opacities (right greater than left), suggest superimposed areas of subsegmental atelectasis, although underlying airspace consolidation is not entirely excluded. No evidence of pulmonary edema. Heart size is normal. Upper mediastinal contours are within normal limits. IMPRESSION: 1. Persistent moderate right and small left pleural effusions with  associated areas of probable passive subsegmental atelectasis throughout the lung bases bilaterally. Electronically Signed   By: Vinnie Langton M.D.   On: 12/05/2016 07:53   Dg Chest 2 View  Result Date: 11/19/2016 CLINICAL DATA:  Short of breath. Discharged 3 days ago with congestive heart failure. EXAM: CHEST  2 VIEW COMPARISON:  11/10/2016 FINDINGS: Degraded lateral view secondary to positioning and overlying artifact. Midline trachea. Normal heart size for level of inspiration. Increase in moderate right pleural effusion. No pneumothorax. Low lung volumes. Mild pulmonary venous congestion. Worsened right and developing mild left base airspace disease. IMPRESSION: Increase in moderate right pleural effusion with adjacent atelectasis or infection. Mild pulmonary venous congestion with minimal left base atelectasis. Electronically Signed   By: Abigail Miyamoto M.D.   On: 11/19/2016 09:08   Ct Chest Wo Contrast  Result Date: 11/19/2016 CLINICAL DATA:  Shortness of breath. EXAM: CT CHEST WITHOUT CONTRAST TECHNIQUE: Multidetector CT imaging of the chest was performed following the  standard protocol without IV contrast. COMPARISON:  Chest x-ray from same day. CT chest dated October 31, 2016. FINDINGS: Cardiovascular: Mild cardiomegaly, unchanged. New small pericardial effusion. Coronary, aortic arch, and branch vessel atherosclerotic vascular disease. Mediastinum/Nodes: Prominent and mildly enlarged mediastinal lymph nodes are stable to slightly decreased in size. No axillary lymphadenopathy. The thyroid gland, trachea, and esophagus are unremarkable. Lungs/Pleura: Large right and small left pleural effusions. Right greater than left bibasilar atelectasis. Additional atelectasis in the right middle lobe. No suspicious pulmonary nodules. No consolidation or pneumothorax. Upper Abdomen: No acute abnormality. Musculoskeletal: No chest wall mass or suspicious bone lesions identified. No fracture. Unchanged degenerative changes of the thoracic spine, worst at T7-T8 and T10-T11. IMPRESSION: 1. Large right and small left pleural effusions with adjacent right greater than left atelectasis. 2. New small pericardial effusion. 3. Stable to slightly decreased in size mildly enlarged mediastinal lymph nodes, likely reactive. 4.  Aortic atherosclerosis (ICD10-I70.0). Electronically Signed   By: Titus Dubin M.D.   On: 11/19/2016 11:16   Dg Chest Right Decubitus  Result Date: 11/19/2016 CLINICAL DATA:  Right pleural effusion. EXAM: CHEST - RIGHT DECUBITUS COMPARISON:  Chest x-ray from earlier today and CT of the chest November 19, 2016 FINDINGS: The right-side-down decubitus film demonstrates a layering right-sided effusion which correlates with comparison imaging. IMPRESSION: The right-sided pleural effusion layers on this study. Electronically Signed   By: Dorise Bullion III M.D   On: 11/19/2016 15:29   Nm Myocar Multi W/spect Tamela Oddi Motion / Ef  Result Date: 11/15/2016  There was no ST segment deviation noted during stress. T wave inversions inferiorly.  Defect 1: There is a large defect of  severe severity present in the basal inferolateral, basal anterolateral, mid anterior, mid anteroseptal, mid inferolateral, mid anterolateral, apical anterior, apical inferior, apical lateral and apex location.  This is a high risk study.  Findings consistent with prior myocardial infarction. No ischemic territories.  Nuclear stress EF: 21%.    Dg Chest Port 1 View  Result Date: 12/03/2016 CLINICAL DATA:  Shortness of breath. EXAM: PORTABLE CHEST 1 VIEW COMPARISON:  12/01/2016 FINDINGS: Bilateral lower lung opacities and bilateral pleural effusions again noted. Visualized cardiomediastinal silhouette is unchanged. No pneumothorax. There has been little interval change since the prior study. IMPRESSION: Unchanged appearance of the chest with bilateral lower lung opacities/ atelectasis and bilateral pleural effusions. Electronically Signed   By: Margarette Canada M.D.   On: 12/03/2016 15:09   Dg Chest Port 1 View  Result Date: 11/20/2016 CLINICAL  DATA:  Shortness of breath EXAM: PORTABLE CHEST 1 VIEW COMPARISON:  11/19/2016 FINDINGS: Large right and small to moderate left pleural effusions causing hazy opacities obscuring the lung bases. Mild enlargement of the cardiopericardial silhouette. No findings of acute pulmonary edema. The right pleural effusion has a greater layering effect than on 11/19/2016, possibly due to lack of complete settling prior to imaging. IMPRESSION: 1. Similar appearance of large right and small to moderate left pleural effusions with passive atelectasis. 2. Mild enlargement of the cardiopericardial silhouette. Electronically Signed   By: Van Clines M.D.   On: 11/20/2016 12:09   Dg Chest Port 1 View  Result Date: 11/10/2016 CLINICAL DATA:  Shortness of breath tonight EXAM: PORTABLE CHEST 1 VIEW COMPARISON:  October 31, 2016 FINDINGS: The mediastinal contour is normal. The heart size is mildly enlarged. There is a small right pleural effusion with patchy consolidation of right  lung base. The left lung is clear. No acute abnormalities identified in the visualized bones. IMPRESSION: Small right pleural effusion with patchy consolidation of right lung base, underline pneumonia is not excluded. Electronically Signed   By: Abelardo Diesel M.D.   On: 11/10/2016 21:28   US Thoracentesis Asp Pleural Space W/img Guide  Result Date: 11/21/2016 INDICATION: Right pleural effusion. EXAM: ULTRASOUND GUIDED RIGHT THORACENTESIS MEDICATIONS: None. COMPLICATIONS: None immediate. PROCEDURE: An ultrasound guided thoracentesis was thoroughly discussed with the patient and questions answered. The benefits, risks, alternatives and complications were also discussed. The patient understands and wishes to proceed with the procedure. Written consent was obtained. Ultrasound was performed to localize and mark an adequate pocket of fluid in the RIGHT chest. The area was then prepped and draped in the normal sterile fashion. 1% Lidocaine was used for local anesthesia. Under ultrasound guidance a Yueh catheter was introduced. Thoracentesis was performed. The catheter was removed and a dressing applied. FINDINGS: A total of approximately 1.7 L of dark blood-tinged fluid was removed. Samples were sent to the laboratory as requested by the clinical team. IMPRESSION: Successful ultrasound guided right thoracentesis yielding 1.7 L of pleural fluid. Electronically Signed   By: Lorriane Shire M.D.   On: 11/21/2016 13:09     LOS: 4 days   Oren Binet, MD  [facility] Pager:[phone]  If 7PM-7AM, please contact night-coverage [email or web address] Password [email or web address] 12/07/2016, 1:07 PM

## 2016-12-07 NOTE — Progress Notes (Signed)
Daily Progress Note   Patient Name: [name]       Date: 12/07/2016 DOB: 01/13/1946  Age: 71 y.o. MRN#: [id number] Attending Physician: Jonetta Osgood, MD Primary Care Physician: [name], MD Admit Date: 12/03/2016  Reason for Consultation/Follow-up: Establishing goals of care  Subjective: [name] is sitting up in recliner again. Feeling a little better and affect not as flat as previous days.   Length of Stay: 4  Current Medications: Scheduled Meds:  . ALPRAZolam  0.5 mg Oral QHS  . amiodarone  200 mg Oral BID  . Chlorhexidine Gluconate Cloth  6 each Topical Daily  . darbepoetin (ARANESP) injection - NON-DIALYSIS  100 mcg Subcutaneous Q Sun-1800  . feeding supplement (GLUCERNA SHAKE)  237 mL Oral BID BM  . fluticasone  1 spray Each Nare Daily  . insulin aspart  0-5 Units Subcutaneous QHS  . insulin aspart  0-9 Units Subcutaneous TID WC  . insulin glargine  15 Units Subcutaneous QHS  . isosorbide mononitrate  30 mg Oral Daily  . latanoprost  1 drop Both Eyes QHS  . metoprolol succinate  25 mg Oral Q breakfast  . pantoprazole  40 mg Oral Daily  . polyethylene glycol  17 g Oral BID  . rosuvastatin  10 mg Oral QHS  . sodium chloride flush  10-40 mL Intracatheter Q12H  . sodium chloride flush  3 mL Intravenous Q12H  . Warfarin - Pharmacist Dosing Inpatient   Does not apply q1800    Continuous Infusions: . sodium chloride    . ferumoxytol Stopped (12/06/16 1900)  . furosemide Stopped (12/07/16 0609)    PRN Meds: sodium chloride, acetaminophen, albuterol, guaiFENesin-dextromethorphan, ondansetron (ZOFRAN) IV, sodium chloride flush, sodium chloride flush  Physical Exam  Constitutional: She is oriented to person, place, and time. She appears well-developed.    HENT:  Head: Normocephalic and atraumatic.  Cardiovascular: Normal rate and regular rhythm.   Pulmonary/Chest: Effort normal. No accessory muscle usage. No tachypnea. No respiratory distress.  Abdominal: Normal appearance.  Neurological: She is alert and oriented to person, place, and time.  Nursing note and vitals reviewed.           Vital Signs: BP 137/80   Pulse 73   Temp 97.8 F (36.6 C) (Oral)   Resp (!) 21   Ht  5\' 8"  (1.727 m)   Wt 94.2 kg (207 lb 10.8 oz)   SpO2 98%   BMI 31.58 kg/m  SpO2: SpO2: 98 % O2 Device: O2 Device: Not Delivered O2 Flow Rate: O2 Flow Rate (L/min): 1 L/min  Intake/output summary:   Intake/Output Summary (Last 24 hours) at 12/07/16 1339 Last data filed at 12/07/16 1200  Gross per 24 hour  Intake              494 ml  Output             3675 ml  Net            -3181 ml   LBM: Last BM Date: 11/30/16 Baseline Weight: Weight: 93.3 kg (205 lb 11 oz) Most recent weight: Weight: 94.2 kg (207 lb 10.8 oz)       Palliative Assessment/Data: 50%      Patient Active Problem List   Diagnosis Date Noted  . Goals of care, counseling/discussion   . Palliative care encounter   . Hypercoagulopathy ([place]) 12/03/2016  . Acute on chronic heart failure ([place]) 12/03/2016  . Ischemic cardiomyopathy   . Generalized weakness   . Atrial fibrillation with RVR ([place]) 11/19/2016  . Abnormal nuclear stress test   . Left ventricular dysfunction   . Atrial fibrillation ([place]) 11/13/2016  . Anemia in chronic kidney disease   . Atrial fibrillation, new onset ([place]) 11/10/2016  . Hypoglycemia 11/10/2016  . Pleural effusion on right 10/31/2016  . HCAP (healthcare-associated pneumonia) 10/31/2016  . DM type 2 causing vascular disease ([place]) 10/05/2016  . Personal history of noncompliance with medical treatment, presenting hazards to health 08/22/2016  . Hyperglycemia 08/07/2016  . Pulmonary edema 07/14/2016  . CKD (chronic kidney disease), stage IV ([place]) 07/14/2016   . Acute respiratory failure with hypoxia ([place]) 07/09/2016  . Acute on chronic systolic CHF (congestive heart failure) ([place]) 12/01/2015  . AKI (acute kidney injury) ([place])   . Ischemia of upper extremity   . Right knee pain   . Subclavian artery stenosis, right ([place]) 07/07/2015  . Acute on chronic combined systolic and diastolic CHF (congestive heart failure) ([place]) 07/04/2015  . Elevated troponin 07/04/2015  . Acute renal failure superimposed on chronic kidney disease ([place]) 07/04/2015  . Non-ST elevation (NSTEMI) myocardial infarction ([place]) 07/04/2015  . Elevated d-dimer 07/04/2015  . Acute respiratory failure with hypercapnia ([place])   . Bilateral lower extremity edema 01/20/2012  . Type 2 diabetes mellitus with stage 4 chronic kidney disease, with long-term current use of insulin ([place]) 01/21/2011  . Overweight 07/20/2009  . Coronary artery disease 10/08/2008  . HEART MURMUR, SYSTOLIC 28/41/3244  . SHOULDER PAIN 02/05/2007  . Hyperlipemia 04/11/2006  . Anxiety state 04/11/2006  . SYNDROME, CARPAL TUNNEL 04/11/2006  . Essential hypertension 04/11/2006  . ALLERGIC RHINITIS 04/11/2006  . GERD 04/11/2006  . Constipation 04/11/2006  . OVERACTIVE BLADDER 04/11/2006  . OSTEOARTHRITIS 04/11/2006  . 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 Fib admitted 12/03/2016 with SOB and CHF exacerbation with acute on chronic renal failure.   Assessment: [name] is more alert today. Says she feels a little  better. She is tired of being in the hospital "but I want to stay until I'm better."  Talks of wanting to get better before she goes home. We reviewed the progress that she has made. Urine output improved. Has had BM today.   [name] is hopeful for improvement. Tells [name] that "God is going to take care of it, I'm not worried" when talking about the complications from her heart failure. Emotional support provided.   Goals are clear for continued aggressive care at this time. Please call for further palliative needs or further decline.   Recommendations/Plan:  Cardiology and renal following.  No SOB and on room air today. Having improved urine output.   Constipation: LBM 10/3. Miralax daily prn.   Goals of Care and Additional Recommendations:  Limitations on Scope of Treatment: Full Scope Treatment  Code Status:  Full code  Prognosis:   Unable to determine.   Discharge Planning:  To Be Determined  Thank you for allowing the Palliative Medicine Team to assist in the care of this patient.   Total Time 15 min Prolonged Time Billed  no       Greater than 50%  of this time was spent counseling and coordinating care related to the above assessment and plan.  Vinie Sill, NP Palliative Medicine Team Pager # [phone] (M-F 8a-5p) Team Phone # [phone] (Nights/Weekends)

## 2016-12-07 NOTE — Progress Notes (Signed)
[place] for warfarin Indication: atrial fibrillation  Allergies  Allergen Reactions  . Ace Inhibitors Cough  . Amlodipine Other (See Comments)    Edema   . Codeine Rash    Patient Measurements: Height: 5\' 8"  (172.7 cm) Weight: 207 lb 10.8 oz (94.2 kg) IBW/kg (Calculated) : 63.9  Assessment: 71 YOF transferred from [facility] on 9/29. On Coumadin alternating 7.5mg  and 3.75mg  every other day PTA for Afib per records from [place] = 5. Has been on clindamycin and trimethoprim recently. INR was elevated on admit and now trending down to 3.48. Hgb mostly stable but low at 8.1, plts wnl.  Note amiodarone interaction (new start this admit) and CHF related hepatic dysfunction will likely increase her warfarin sensitivity. Per Dr. Sallyanne Kuster, consider restarting warfarin 10/4.  Goal of Therapy:  INR 2-3 Monitor platelets by anticoagulation protocol: Yes   Plan:  Hold warfarin tonight Monitor daily INR, CBC, s/s of bleed   Norva Riffle 12/07/2016 10:27 AM

## 2016-12-07 NOTE — Progress Notes (Signed)
  Second visit with patient. She is feeling some better and able to get up and walk and very thankful for that. She had husband, brother and daughter with her making a visit.Maudie Flakes for peace of mind and continued healing and strength. Conard Novak, Chaplain

## 2016-12-07 NOTE — Progress Notes (Signed)
[facility] Rounding Note  Subjective/Interval History:  Much better spirits DOE but not while just lying in bed Making urine in response to IV lasix  Objective Vital signs in last 24 hours: Vitals:   12/07/16 0700 12/07/16 0729 12/07/16 0800 12/07/16 1552  BP: (!) 130/53 (!) 132/94 137/80 (!) 148/77  Pulse: 70 72 73 70  Resp: 12 16 (!) 21 18  Temp:  97.8 F (36.6 C)  (!) 97.3 F (36.3 C)  TempSrc:  Oral  Oral  SpO2: 97% 97% 98% 97%  Weight:      Height:       Weight change: 0.8 kg (1 lb 12.2 oz)  Intake/Output Summary (Last 24 hours) at 12/07/16 1628 Last data filed at 12/07/16 1200  Gross per 24 hour  Intake              494 ml  Output             3225 ml  Net            -2731 ml   Physical Exam:  Blood pressure (!) 148/77, pulse 70, temperature (!) 97.3 F (36.3 C), temperature source Oral, resp. rate 18, height 5\' 8"  (1.727 m), weight 94.2 kg (207 lb 10.8 oz), SpO2 97 %.  Awake, alert. More her usual self today VS as noted Lungs grossly clear S1S2 No S3 Sinus brady Abd soft and not tender 1-2+ pitting edema both LE's No asterixus  Recent Labs Lab 12/04/16 0003 12/04/16 1011 12/05/16 0533 12/06/16 0335 12/07/16 0500  NA 128* 128* 131* 130* 134*  K 4.3 4.4 4.4 4.1 3.8  CL 92* 92* 93* 92* 94*  CO2 24 23 29 27 28   GLUCOSE 301* 289* 172* 184* 124*  BUN 83* 87* 94* 96* 88*  CREATININE 4.28* 4.39* 4.75* 4.65* 4.21*  CALCIUM 8.4* 8.3* 8.4* 8.4* 8.5*  PHOS  --   --   --  5.7* 5.3*    Recent Labs Lab 12/04/16 1011 12/06/16 0335 12/07/16 0500  AST 51*  --   --   ALT 40  --   --   ALKPHOS 125  --   --   BILITOT 0.7  --   --   PROT 6.6  --   --   ALBUMIN 2.5* 2.3* 2.2*     Recent Labs Lab 12/04/16 0003 12/05/16 0533 12/06/16 0335 12/07/16 0500  WBC 7.0 5.0 5.7 5.6  NEUTROABS  --  3.7 4.4 4.2  HGB 8.2* 7.7* 7.8* 8.1*  HCT 26.2* 24.5* 25.5* 24.8*  MCV 92.9 92.8 93.1 92.2  PLT 269 283 294 294    Recent Labs Lab 12/04/16 0004 12/04/16 1011   TROPONINI 7.82* 5.78*   Lab Results  Component Value Date   INR 3.48 12/07/2016   INR 4.24 (HH) 12/06/2016   INR 4.51 (HH) 12/05/2016     Recent Labs Lab 12/06/16 2039 12/07/16 0729 12/07/16 1127 12/07/16 1210 12/07/16 1352  GLUCAP 282* 143* 173* 204* 218*   Iron/TIBC/Ferritin/ %Sat    Component Value Date/Time   IRON 23 (L) 12/06/2016 0335   TIBC 276 12/06/2016 0335   IRONPCTSAT 8 (L) 12/06/2016 0335   Studies/Results: Dg Chest 2 View  Result Date: 12/07/2016 CLINICAL DATA:  71 year old female with shortness of breath. Admitted with acute on chronic heart failure. EXAM: CHEST  2 VIEW COMPARISON:  12/05/2016 and earlier. FINDINGS: AP and lateral views of the chest. Continued moderate-sized veiling opacity at both lung bases and fluid tracking in the  pleural fissures, including the right minor fissure. Stable pulmonary vascularity, no overt edema. No pneumothorax. Stable cardiac size and mediastinal contours. No acute osseous abnormality identified. Paucity of bowel gas in the upper abdomen. IMPRESSION: 1. Continued moderate bilateral pleural effusions with bibasilar collapse or consolidation. 2. Stable pulmonary vascularity without overt edema. Electronically Signed   By: Genevie Ann M.D.   On: 12/07/2016 08:19   Medications: . sodium chloride    . ferumoxytol Stopped (12/06/16 1900)  . furosemide Stopped (12/07/16 6387)   . ALPRAZolam  0.5 mg Oral QHS  . amiodarone  200 mg Oral BID  . Chlorhexidine Gluconate Cloth  6 each Topical Daily  . darbepoetin (ARANESP) injection - NON-DIALYSIS  100 mcg Subcutaneous Q Sun-1800  . feeding supplement (GLUCERNA SHAKE)  237 mL Oral BID BM  . fluticasone  1 spray Each Nare Daily  . insulin aspart  0-5 Units Subcutaneous QHS  . insulin aspart  0-9 Units Subcutaneous TID WC  . insulin glargine  15 Units Subcutaneous QHS  . isosorbide mononitrate  30 mg Oral Daily  . latanoprost  1 drop Both Eyes QHS  . metoprolol succinate  25 mg Oral Q  breakfast  . pantoprazole  40 mg Oral Daily  . polyethylene glycol  17 g Oral BID  . rosuvastatin  10 mg Oral QHS  . sodium chloride flush  10-40 mL Intracatheter Q12H  . sodium chloride flush  3 mL Intravenous Q12H  . Warfarin - Pharmacist Dosing Inpatient   Does not apply q1800    Background: 71 y.o. 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 Lorrene Reid - BL creatinine mid 2's) admitted with SOB and fluid overload initially to [facility]. AKI hemodynamically driven (bradycardia/low BP). Creatinine peaked at 4.75  Assessment/Recommendations  1. 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 DM, HTN, OAB issues. BL creatinine usual mid to low 2's (2.0  as recently as 11/27/16) with AKI on CKD in setting of AFib, large doses toprol, low HR and BP. I believe much of this is hemodynamically driven. Toprol dose reduced to 25. Still brady but BP up some. Added some IV lasix yesterday - diuresing AND renal function has improved some!!!  (Most recent outpt dose of torsemide was 80 BID which worked when creatinine in the 2's, was not sure would be effective now. So started with IV lasix and UOP has picked up). Stay with the 120 Q12H IV for now and if renal function continues to improve then can transition back to po torsemide. She is not clinically uremic but understands that renal function could go in the wrong direction and could require HD.  2. Anemia  - has had good outpt Hb's. Has required Feraheme in the past, never Aranesp. Started that here. 100 mcg/week (1st dosed 9/30). Fe quite low - Feraheme dosed 10/2. Would personally have low threshold to transfuse given her CAD.  3. CAD - + trops falling. Several prior  interventions. No plans for cath so far this trip. Risk outweighs benefit per cards.  4. DM - per primary service 5. Supratherapeutic INR - pharmacy monitoring.   Jamal Maes, MD [facility] [phone] pager 12/07/2016, 4:28 PM

## 2016-12-07 NOTE — Progress Notes (Signed)
Progress Note  Patient Name: [name] Date of Encounter: 12/07/2016  Primary Cardiologist: Bronson Ing  Subjective   Substantial improvement from multiple parameters. She is more alert and has a positive attitude.  On continues furosemide infusion.  Urine output has increased substantially, with simultaneous improvement in creatinine level. Denies dyspnea, complains of easy fatigue. No angina. No arrhythmia on monitor. Blood pressure in desirable range.  Inpatient Medications    Scheduled Meds: . ALPRAZolam  0.5 mg Oral QHS  . amiodarone  200 mg Oral BID  . Chlorhexidine Gluconate Cloth  6 each Topical Daily  . darbepoetin (ARANESP) injection - NON-DIALYSIS  100 mcg Subcutaneous Q Sun-1800  . feeding supplement (GLUCERNA SHAKE)  237 mL Oral BID BM  . fluticasone  1 spray Each Nare Daily  . insulin aspart  0-5 Units Subcutaneous QHS  . insulin aspart  0-9 Units Subcutaneous TID WC  . insulin glargine  15 Units Subcutaneous QHS  . isosorbide mononitrate  30 mg Oral Daily  . latanoprost  1 drop Both Eyes QHS  . metoprolol succinate  25 mg Oral Q breakfast  . pantoprazole  40 mg Oral Daily  . polyethylene glycol  17 g Oral BID  . rosuvastatin  10 mg Oral QHS  . sodium chloride flush  10-40 mL Intracatheter Q12H  . sodium chloride flush  3 mL Intravenous Q12H  . Warfarin - Pharmacist Dosing Inpatient   Does not apply q1800   Continuous Infusions: . sodium chloride    . ferumoxytol Stopped (12/06/16 1900)  . furosemide Stopped (12/07/16 9892)   PRN Meds: sodium chloride, acetaminophen, albuterol, guaiFENesin-dextromethorphan, ondansetron (ZOFRAN) IV, sodium chloride flush, sodium chloride flush   Vital Signs    Vitals:   12/07/16 0600 12/07/16 0700 12/07/16 0729 12/07/16 0800  BP: 129/60 (!) 130/53 (!) 132/94 137/80  Pulse: 70 70 72 73  Resp: 13 12 16  (!) 21  Temp:   97.8 F (36.6 C)   TempSrc:   Oral   SpO2: 97% 97% 97% 98%  Weight:      Height:         Intake/Output Summary (Last 24 hours) at 12/07/16 0952 Last data filed at 12/07/16 0919  Gross per 24 hour  Intake              244 ml  Output             3000 ml  Net            -2756 ml   Filed Weights   12/06/16 0500 12/07/16 0404 12/07/16 0451  Weight: 209 lb 7 oz (95 kg) 211 lb 3.2 oz (95.8 kg) 207 lb 10.8 oz (94.2 kg)    Telemetry    Normal sinus rhythm - Personally Reviewed  ECG    No new tracing - Personally Reviewed  Physical Exam  Comfortable, smiling while eating breakfast GEN: No acute distress.   Neck:  hard to see JVD Cardiac: RRR, no murmurs, rubs, or gallops.  Respiratory: Clear to auscultation bilaterally. GI: Soft, nontender, non-distended  MS:  generalized edema, 2+; No deformity. Neuro:  Nonfocal  Psych: Normal affect   Labs    Chemistry Recent Labs Lab 12/04/16 1011 12/05/16 0533 12/06/16 0335 12/07/16 0500  NA 128* 131* 130* 134*  K 4.4 4.4 4.1 3.8  CL 92* 93* 92* 94*  CO2 23 29 27 28   GLUCOSE 289* 172* 184* 124*  BUN 87* 94* 96* 88*  CREATININE 4.39* 4.75* 4.65*  4.21*  CALCIUM 8.3* 8.4* 8.4* 8.5*  PROT 6.6  --   --   --   ALBUMIN 2.5*  --  2.3* 2.2*  AST 51*  --   --   --   ALT 40  --   --   --   ALKPHOS 125  --   --   --   BILITOT 0.7  --   --   --   GFRNONAA 9* 8* 9* 10*  GFRAA 11* 10* 10* 11*  ANIONGAP 13 9 11 12      Hematology Recent Labs Lab 12/05/16 0533 12/06/16 0335 12/07/16 0500  WBC 5.0 5.7 5.6  RBC 2.64* 2.74* 2.69*  HGB 7.7* 7.8* 8.1*  HCT 24.5* 25.5* 24.8*  MCV 92.8 93.1 92.2  MCH 29.2 28.5 30.1  MCHC 31.4 30.6 32.7  RDW 18.1* 17.5* 17.8*  PLT 283 294 294    Cardiac Enzymes Recent Labs Lab 12/04/16 0004 12/04/16 1011  TROPONINI 7.82* 5.78*   No results for input(s): TROPIPOC in the last 168 hours.   BNP Recent Labs Lab 12/04/16 1820  BNP 2,222.9*     DDimer No results for input(s): DDIMER in the last 168 hours.   Radiology    Dg Chest 2 View  Result Date: 12/07/2016 CLINICAL  DATA:  71 year old female with shortness of breath. Admitted with acute on chronic heart failure. EXAM: CHEST  2 VIEW COMPARISON:  12/05/2016 and earlier. FINDINGS: AP and lateral views of the chest. Continued moderate-sized veiling opacity at both lung bases and fluid tracking in the pleural fissures, including the right minor fissure. Stable pulmonary vascularity, no overt edema. No pneumothorax. Stable cardiac size and mediastinal contours. No acute osseous abnormality identified. Paucity of bowel gas in the upper abdomen. IMPRESSION: 1. Continued moderate bilateral pleural effusions with bibasilar collapse or consolidation. 2. Stable pulmonary vascularity without overt edema. Electronically Signed   By: Genevie Ann M.D.   On: 12/07/2016 08:19    Cardiac Studies   11/2016 nuclear stress: large inferolateralscar, LVEF 22%.  11/2015 cath: patent RCA stents, mod LAD disease, LCX CTO. Recs for medical thearpy.  11/2016 LVEF 30-35%, grade II diastolic dysfunciton  Patient Profile     71 y.o. 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   Assessment & Plan    1. CHF:  Remains markedly hypervolemic,  But encoura substantial improvement in urine output after restarting diuretics. Defer diuretic dosing to Dr. Lorrene Reid. Avoid all RAAs inh. On reduced dose beta blocker. 2. CAD:  angina free; recent nuclear study without meaningful area of viability/reversible ischemia. Risks of LCX intervention outweigh the benefit. Has abnormal cardiac enzymes, c/w another small NSTEMI, but no angina. No plan for invasive procedures. 3. PAFib: no arrhythmia in last 3 days,  continue amiodarone, plan to reduce dose to 200 mg daily in about a week. 4. Warfarin: INR remains supratherapeutic, but is improving. INR 3.48 today. May be ready to resume warfarin as early as tomorrow.  (likely CHF related hepatic dysfunction as well as amiodarone interaction). 5. Ac on chr renal failure: beginning to show improvement. Background of DM/HTN nephropathy, baseline creat in 2s.   For questions or updates, please contact [facility] Please consult [email or web address] for contact info under Cardiology/STEMI.      Signed, Sanda Klein, MD  12/07/2016, 9:52 AM

## 2016-12-07 NOTE — Progress Notes (Signed)
Daily Progress Note   Patient Name: [name]       Date: 12/07/2016 DOB: 1945-07-28  Age: 71 y.o. MRN#: [id number] Attending Physician: Jonetta Osgood, MD Primary Care Physician: [name], MD Admit Date: 12/03/2016  Reason for Consultation/Follow-up: Establishing goals of care  Subjective: [name] is sitting up in recliner today with lunch in front of her. She says "the food her is awful." Says her appetite was good at home.   Length of Stay: 4  Current Medications: Scheduled Meds:  . ALPRAZolam  0.5 mg Oral QHS  . amiodarone  200 mg Oral BID  . Chlorhexidine Gluconate Cloth  6 each Topical Daily  . darbepoetin (ARANESP) injection - NON-DIALYSIS  100 mcg Subcutaneous Q Sun-1800  . feeding supplement (GLUCERNA SHAKE)  237 mL Oral BID BM  . fluticasone  1 spray Each Nare Daily  . insulin aspart  0-5 Units Subcutaneous QHS  . insulin aspart  0-9 Units Subcutaneous TID WC  . insulin glargine  15 Units Subcutaneous QHS  . isosorbide mononitrate  30 mg Oral Daily  . latanoprost  1 drop Both Eyes QHS  . metoprolol succinate  25 mg Oral Q breakfast  . pantoprazole  40 mg Oral Daily  . polyethylene glycol  17 g Oral BID  . rosuvastatin  10 mg Oral QHS  . sodium chloride flush  10-40 mL Intracatheter Q12H  . sodium chloride flush  3 mL Intravenous Q12H  . Warfarin - Pharmacist Dosing Inpatient   Does not apply q1800    Continuous Infusions: . sodium chloride    . ferumoxytol Stopped (12/06/16 1900)  . furosemide Stopped (12/07/16 0609)    PRN Meds: sodium chloride, acetaminophen, albuterol, guaiFENesin-dextromethorphan, ondansetron (ZOFRAN) IV, sodium chloride flush, sodium chloride flush  Physical Exam  Constitutional: She is oriented to person, place, and  time. She appears well-developed.  HENT:  Head: Normocephalic and atraumatic.  Cardiovascular: Regular rhythm.  Bradycardia present.   Pulmonary/Chest: Effort normal. No accessory muscle usage. No tachypnea. No respiratory distress.  Abdominal: Normal appearance.  Neurological: She is alert and oriented to person, place, and time.  Nursing note and vitals reviewed.           Vital Signs: BP 137/80   Pulse 73   Temp 97.8 F (36.6 C) (Oral)  Resp (!) 21   Ht '5\' 8"'$  (1.727 m)   Wt 94.2 kg (207 lb 10.8 oz)   SpO2 98%   BMI 31.58 kg/m  SpO2: SpO2: 98 % O2 Device: O2 Device: Not Delivered O2 Flow Rate: O2 Flow Rate (L/min): 1 L/min  Intake/output summary:   Intake/Output Summary (Last 24 hours) at 12/07/16 0837 Last data filed at 12/07/16 5631  Gross per 24 hour  Intake              244 ml  Output             2650 ml  Net            -2406 ml   LBM: Last BM Date: 11/30/16 Baseline Weight: Weight: 93.3 kg (205 lb 11 oz) Most recent weight: Weight: 94.2 kg (207 lb 10.8 oz)       Palliative Assessment/Data: 30%      Patient Active Problem List   Diagnosis Date Noted  . Goals of care, counseling/discussion   . Palliative care encounter   . Hypercoagulopathy ([place]) 12/03/2016  . Acute on chronic heart failure ([place]) 12/03/2016  . Ischemic cardiomyopathy   . Generalized weakness   . Atrial fibrillation with RVR ([place]) 11/19/2016  . Abnormal nuclear stress test   . Left ventricular dysfunction   . Atrial fibrillation ([place]) 11/13/2016  . Anemia in chronic kidney disease   . Atrial fibrillation, new onset ([place]) 11/10/2016  . Hypoglycemia 11/10/2016  . Pleural effusion on right 10/31/2016  . HCAP (healthcare-associated pneumonia) 10/31/2016  . DM type 2 causing vascular disease ([place]) 10/05/2016  . Personal history of noncompliance with medical treatment, presenting hazards to health 08/22/2016  . Hyperglycemia 08/07/2016  . Pulmonary edema 07/14/2016  . CKD (chronic  kidney disease), stage IV ([place]) 07/14/2016  . Acute respiratory failure with hypoxia ([place]) 07/09/2016  . Acute on chronic systolic CHF (congestive heart failure) ([place]) 12/01/2015  . AKI (acute kidney injury) ([place])   . Ischemia of upper extremity   . Right knee pain   . Subclavian artery stenosis, right ([place]) 07/07/2015  . Acute on chronic combined systolic and diastolic CHF (congestive heart failure) ([place]) 07/04/2015  . Elevated troponin 07/04/2015  . Acute renal failure superimposed on chronic kidney disease ([place]) 07/04/2015  . Non-ST elevation (NSTEMI) myocardial infarction ([place]) 07/04/2015  . Elevated d-dimer 07/04/2015  . Acute respiratory failure with hypercapnia ([place])   . Bilateral lower extremity edema 01/20/2012  . Type 2 diabetes mellitus with stage 4 chronic kidney disease, with long-term current use of insulin ([place]) 01/21/2011  . Overweight 07/20/2009  . Coronary artery disease 10/08/2008  . HEART MURMUR, SYSTOLIC 49/70/2637  . SHOULDER PAIN 02/05/2007  . Hyperlipemia 04/11/2006  . Anxiety state 04/11/2006  . SYNDROME, CARPAL TUNNEL 04/11/2006  . Essential hypertension 04/11/2006  . ALLERGIC RHINITIS 04/11/2006  . GERD 04/11/2006  . Constipation 04/11/2006  . OVERACTIVE BLADDER 04/11/2006  . OSTEOARTHRITIS 04/11/2006  . 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 Fib admitted 12/03/2016 with SOB and CHF exacerbation with acute on chronic renal failure.   Assessment: I met again with [name]. She is more talkative and alert today. Still somewhat flat affect. She is  tired of being in the hospital and eating the bad food. She is trying to make herself eat but does not enjoy it - says that she has good appetite and enjoys food at home. Will order Glucerna for her as well as she drinks this at home. Discussed that renal function is slightly improved today and she continues to having better urine output. She is hopeful for continued improvement. Denies shortness of breath.   She had no questions or other concerns for [place] today. Emotional support and listening provided.   Recommendations/Plan:  Cardiology and renal following.  No SOB and on room air today. Having urine output.   Constipation: Increase Miralax to BID. She says that she take Linzess at home sometimes for constipation.   Goals of Care and Additional Recommendations:  Limitations on Scope of Treatment: Full Scope Treatment  Code Status:  Full code  Prognosis:   Unable to determine.   Discharge Planning:  To Be Determined  Thank you for allowing the Palliative Medicine Team to assist in the care of this patient.   Total Time 25 min Prolonged Time Billed  no       Greater than 50%  of this time was spent counseling and coordinating care related to the above assessment and plan.  Vinie Sill, NP Palliative Medicine Team Pager # [phone] (M-F 8a-5p) Team Phone # [phone] (Nights/Weekends)

## 2016-12-07 NOTE — Progress Notes (Signed)
Physical Therapy Treatment Patient Details Name: CHAPEL SILVERTHORN MRN: [id number] DOB: 06-03-1945 Today's Date: 12/07/2016    History of Present Illness Patient is a 71 y/o female who presents with worsening SOB. Admitted with acute on chronic heart failure and acute on chronic kidney disease. CXR-Persistent moderate right and small left pleural effusions. Recent hospital admission at [facility]. 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    PT Comments    Pt making steady progress. Continue to feel pt needs ST-SNF prior to return home.   Follow Up Recommendations  SNF     Equipment Recommendations  None recommended by PT    Recommendations for Other Services       Precautions / Restrictions Precautions Precautions: Fall Restrictions Weight Bearing Restrictions: No    Mobility  Bed Mobility               General bed mobility comments: Pt up in chair  Transfers Overall transfer level: Needs assistance Equipment used: Rolling walker (2 wheeled) Transfers: Sit to/from Stand Sit to Stand: Min assist         General transfer comment: Assist to bring hipe up and for balance  Ambulation/Gait Ambulation/Gait assistance: Min assist;+2 safety/equipment Ambulation Distance (Feet): 15 Feet Assistive device: Rolling walker (2 wheeled) Gait Pattern/deviations: Step-through pattern;Decreased step length - right;Decreased step length - left;Shuffle;Trunk flexed Gait velocity: decr Gait velocity interpretation: Below normal speed for age/gender General Gait Details: Assist for balance and support. Verbal cues to stand more erect   Stairs            Wheelchair Mobility    Modified Rankin (Stroke Patients Only)       Balance Overall balance assessment: Needs assistance Sitting-balance support: No upper extremity supported;Feet supported Sitting balance-Leahy  Scale: Fair     Standing balance support: Bilateral upper extremity supported Standing balance-[name]: Poor Standing balance comment: walker and min assist for static standing                            Cognition Arousal/Alertness: Awake/alert Behavior During Therapy: Flat affect Overall Cognitive Status: Within Functional Limits for tasks assessed                                        Exercises      General Comments        Pertinent Vitals/Pain Pain Assessment: No/denies pain    Home Living                      Prior Function            PT Goals (current goals can now be found in the care plan section) Progress towards PT goals: Progressing toward goals    Frequency    Min 3X/week      PT Plan Current plan remains appropriate    Co-evaluation              AM-PAC PT "6 Clicks" Daily Activity  Outcome Measure  Difficulty turning over in bed (including adjusting bedclothes, sheets and blankets)?: A Little Difficulty moving from lying on back to sitting on the side of the bed? : Unable Difficulty sitting down on and standing up from a chair with arms (e.g., wheelchair, bedside commode, etc,.)?: Unable  Help needed moving to and from a bed to chair (including a wheelchair)?: A Little Help needed walking in hospital room?: A Little Help needed climbing 3-5 steps with a railing? : Total 6 Click Score: 12    End of Session Equipment Utilized During Treatment: Gait belt Activity Tolerance: Patient limited by fatigue Patient left: in chair;with call bell/phone within reach;with chair alarm set Nurse Communication: Mobility status PT Visit Diagnosis: Unsteadiness on feet (R26.81);Other abnormalities of gait and mobility (R26.89);Muscle weakness (generalized) (M62.81)     Time: 0350-0938 PT Time Calculation (min) (ACUTE ONLY): 12 min  Charges:  $Gait Training: 8-22 mins                    G Codes:       [facility] PT [place] 12/07/2016, 3:46 PM

## 2016-12-07 NOTE — Plan of Care (Signed)
Problem: Physical Regulation: Goal: Ability to maintain clinical measurements within normal limits will improve Outcome: Progressing Patient c/o coughing and having productive cough.Patient coughing up pink tinged mucus.   Problem: Fluid Volume: Goal: Compliance with measures to maintain balanced fluid volume will improve Outcome: Progressing Patient with increased output in presence of high dose Lasix IVPB. Patient put out about 3000 ml in 24 hours. No problems with breathing and edema in legs/arms decreased.Previous night patient required doppler for pulses and tonight they were palpable. Patient did not require oxygen for comfort.

## 2016-12-08 DIAGNOSIS — N183 Chronic kidney disease, stage 3 (moderate): Secondary | ICD-10-CM

## 2016-12-08 LAB — RENAL FUNCTION PANEL
Albumin: 2.3 g/dL — ABNORMAL LOW (ref 3.5–5.0)
Anion gap: 12 (ref 5–15)
BUN: 77 mg/dL — AB (ref 6–20)
CHLORIDE: 94 mmol/L — AB (ref 101–111)
CO2: 30 mmol/L (ref 22–32)
CREATININE: 3.79 mg/dL — AB (ref 0.44–1.00)
Calcium: 8.5 mg/dL — ABNORMAL LOW (ref 8.9–10.3)
GFR calc Af Amer: 13 mL/min — ABNORMAL LOW (ref >60)
GFR calc non Af Amer: 11 mL/min — ABNORMAL LOW (ref >60)
GLUCOSE: 171 mg/dL — AB (ref 65–99)
Phosphorus: 4.5 mg/dL (ref 2.5–4.6)
Potassium: 3.2 mmol/L — ABNORMAL LOW (ref 3.5–5.1)
Sodium: 136 mmol/L (ref 135–145)

## 2016-12-08 LAB — GLUCOSE, CAPILLARY
GLUCOSE-CAPILLARY: 162 mg/dL — AB (ref 65–99)
Glucose-Capillary: 193 mg/dL — ABNORMAL HIGH (ref 65–99)
Glucose-Capillary: 297 mg/dL — ABNORMAL HIGH (ref 65–99)
Glucose-Capillary: 255 mg/dL — ABNORMAL HIGH (ref 65–99)

## 2016-12-08 LAB — PROTIME-INR
INR: 2.69
PROTHROMBIN TIME: 28.3 s — AB (ref 11.4–15.2)

## 2016-12-08 MED ORDER — WARFARIN SODIUM 5 MG PO TABS
5.0000 mg | ORAL_TABLET | Freq: Once | ORAL | Status: AC
  Administered 2016-12-08: 5 mg via ORAL
  Filled 2016-12-08: qty 1

## 2016-12-08 MED ORDER — POTASSIUM CHLORIDE CRYS ER 20 MEQ PO TBCR
30.0000 meq | EXTENDED_RELEASE_TABLET | Freq: Two times a day (BID) | ORAL | Status: AC
  Administered 2016-12-08 (×2): 30 meq via ORAL
  Filled 2016-12-08 (×2): qty 1

## 2016-12-08 NOTE — Clinical Social Work Note (Signed)
Clinical Social Work Assessment  Patient Details  Name: [name] MRN: [id number] Date of Birth: 1945/07/29  Date of referral:  12/08/16               Reason for consult:  Facility Placement                Permission sought to share information with:  Facility Sport and exercise psychologist, Family Supports Permission granted to share information::  Yes, Verbal Permission Granted  Name::     Art therapist::  SNFs  Relationship::  Spouse  Contact Information:  [phone]  Housing/Transportation Living arrangements for the past 2 months:  [place] of Information:  Patient Patient Interpreter Needed:  None Criminal Activity/Legal Involvement Pertinent to Current Situation/Hospitalization:  No - Comment as needed Significant Relationships:  Adult Children, Spouse Lives with:  Spouse Do you feel safe going back to the place where you live?  Yes Need for family participation in patient care:  Yes (Comment)  Care giving concerns:  CSW received consult for possible SNF placement at time of discharge. CSW spoke with patient regarding PT recommendation of SNF placement at time of discharge. Patient reported that she lives with her husband but isn't sure if she will need to go to SNF. CSW to continue to follow and assist with discharge planning needs.   Social Worker assessment / plan:  CSW spoke with patient concerning possibility of rehab at [facility] before returning home.  Employment status:  Retired Advertising copywriter, [employer] PT Recommendations:  [place] / Referral to community resources:  [place]  Patient/Family's Response to care:  Patient reported that she would have to see how she felt when it was time to be discharged. CSW explained that her insurance has to authorize her to go to SNF, which takes a few days. Patient gave CSW permission to speak with her family and send out the referral.    Patient/Family's Understanding of and Emotional Response to Diagnosis, Current Treatment, and Prognosis:  Patient/family is realistic regarding therapy needs and expressed being hopeful for return to home but will consider SNF. Patient expressed understanding of CSW role and discharge process as well as medical condition. No questions/concerns about plan or treatment.    Emotional Assessment Appearance:  Appears stated age Attitude/Demeanor/Rapport:  Other (Appropriate) Affect (typically observed):  Accepting, Appropriate Orientation:  Oriented to Self, Oriented to Situation, Oriented to Place, Oriented to  Time Alcohol / Substance use:  Not Applicable Psych involvement (Current and /or in the community):  No (Comment)  Discharge Needs  Concerns to be addressed:  Care Coordination Readmission within the last 30 days:  Yes Current discharge risk:  None Barriers to Discharge:  Continued Medical Work up   [employer], [place] 12/08/2016, 9:07 AM

## 2016-12-08 NOTE — Progress Notes (Signed)
[facility] Rounding Note  Subjective/Interval History:  Diuresing Feeling better Creatinine improving Needs letter for her job indicating still hospitalized  Objective Vital signs in last 24 hours: Vitals:   12/07/16 2300 12/08/16 0331 12/08/16 0400 12/08/16 0800  BP: (!) 122/58  137/64 (!) 132/56  Pulse:      Resp: 15  15   Temp: 97.9 F (36.6 C) 97.7 F (36.5 C)  (!) 96.2 F (35.7 C)  TempSrc: Oral Oral  Axillary  SpO2: 95%  97%   Weight:      Height:       Weight change:   Intake/Output Summary (Last 24 hours) at 12/08/16 1209 Last data filed at 12/08/16 0600  Gross per 24 hour  Intake               62 ml  Output             1750 ml  Net            -1688 ml   Physical Exam:  Blood pressure (!) 132/56, pulse 66, temperature (!) 96.2 F (35.7 C), temperature source Axillary, resp. rate 15, height 5\' 8"  (1.727 m), weight 94.2 kg (207 lb 10.8 oz), SpO2 97 %.  Awake, alert. Weight 207 ("good outpt dry wt has been 193-197 previously) Upbeat VS as noted Lungs grossly clear S1S2 No S3 Sinus brady Abd soft and not tender 1+ pitting edema both LE's - improved some (when euvolemic as outpt NO edema at all so a ways to go) No asterixus  Recent Labs Lab 12/04/16 0003 12/04/16 1011 12/05/16 0533 12/06/16 0335 12/07/16 0500 12/08/16 0608  NA 128* 128* 131* 130* 134* 136  K 4.3 4.4 4.4 4.1 3.8 3.2*  CL 92* 92* 93* 92* 94* 94*  CO2 24 23 29 27 28 30   GLUCOSE 301* 289* 172* 184* 124* 171*  BUN 83* 87* 94* 96* 88* 77*  CREATININE 4.28* 4.39* 4.75* 4.65* 4.21* 3.79*  CALCIUM 8.4* 8.3* 8.4* 8.4* 8.5* 8.5*  PHOS  --   --   --  5.7* 5.3* 4.5    Recent Labs Lab 12/04/16 1011 12/06/16 0335 12/07/16 0500 12/08/16 0608  AST 51*  --   --   --   ALT 40  --   --   --   ALKPHOS 125  --   --   --   BILITOT 0.7  --   --   --   PROT 6.6  --   --   --   ALBUMIN 2.5* 2.3* 2.2* 2.3*     Recent Labs Lab 12/04/16 0003 12/05/16 0533 12/06/16 0335 12/07/16 0500  WBC 7.0 5.0  5.7 5.6  NEUTROABS  --  3.7 4.4 4.2  HGB 8.2* 7.7* 7.8* 8.1*  HCT 26.2* 24.5* 25.5* 24.8*  MCV 92.9 92.8 93.1 92.2  PLT 269 283 294 294    Recent Labs Lab 12/04/16 0004 12/04/16 1011  TROPONINI 7.82* 5.78*   Lab Results  Component Value Date   INR 2.69 12/08/2016   INR 3.48 12/07/2016   INR 4.24 (HH) 12/06/2016     Recent Labs Lab 12/07/16 1210 12/07/16 1352 12/07/16 1551 12/07/16 2108 12/08/16 0844  GLUCAP 204* 218* 202* 302* 162*   Iron/TIBC/Ferritin/ %Sat    Component Value Date/Time   IRON 23 (L) 12/06/2016 0335   TIBC 276 12/06/2016 0335   IRONPCTSAT 8 (L) 12/06/2016 0335   Studies/Results: Dg Chest 2 View  Result Date: 12/07/2016 CLINICAL DATA:  71 year old female  with shortness of breath. Admitted with acute on chronic heart failure. EXAM: CHEST  2 VIEW COMPARISON:  12/05/2016 and earlier. FINDINGS: AP and lateral views of the chest. Continued moderate-sized veiling opacity at both lung bases and fluid tracking in the pleural fissures, including the right minor fissure. Stable pulmonary vascularity, no overt edema. No pneumothorax. Stable cardiac size and mediastinal contours. No acute osseous abnormality identified. Paucity of bowel gas in the upper abdomen. IMPRESSION: 1. Continued moderate bilateral pleural effusions with bibasilar collapse or consolidation. 2. Stable pulmonary vascularity without overt edema. Electronically Signed   By: Genevie Ann M.D.   On: 12/07/2016 08:19   Medications: . sodium chloride    . ferumoxytol Stopped (12/06/16 1900)  . furosemide Stopped (12/08/16 0708)   . ALPRAZolam  0.5 mg Oral QHS  . amiodarone  200 mg Oral BID  . Chlorhexidine Gluconate Cloth  6 each Topical Daily  . darbepoetin (ARANESP) injection - NON-DIALYSIS  100 mcg Subcutaneous Q Sun-1800  . feeding supplement (GLUCERNA SHAKE)  237 mL Oral BID BM  . fluticasone  1 spray Each Nare Daily  . insulin aspart  0-5 Units Subcutaneous QHS  . insulin aspart  0-9 Units  Subcutaneous TID WC  . insulin glargine  15 Units Subcutaneous QHS  . isosorbide mononitrate  30 mg Oral Daily  . latanoprost  1 drop Both Eyes QHS  . metoprolol succinate  25 mg Oral Q breakfast  . pantoprazole  40 mg Oral Daily  . polyethylene glycol  17 g Oral BID  . rosuvastatin  10 mg Oral QHS  . sodium chloride flush  10-40 mL Intracatheter Q12H  . sodium chloride flush  3 mL Intravenous Q12H  . warfarin  5 mg Oral Once  . Warfarin - Pharmacist Dosing Inpatient   Does not apply q1800    Background: 71 y.o. 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 Lorrene Reid - BL creatinine mid 2's) admitted with SOB and fluid overload initially to [facility]. AKI hemodynamically driven (bradycardia/low BP). Creatinine peaked at 4.75  Assessment/Recommendations  1. 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 DM, HTN, OAB issues.  1. BL creatinine usual mid to low 2's (2.0  as recently as 11/27/16; I follow at [facility])  2. AKI on CKD in setting of AFib, large doses toprol, low HR and BP. I believe much of this hemodynamically driven.  3. Toprol dose reduced to 25.  4. Diuresing very well with IV lasix 120 Q12H. Still 10+ lb over usual euvolemic outpt weight 5. Not ready yet to transition to po (Most recent outpt dose of torsemide was 80 BID which worked when creatinine in the 2's, was not sure would be effective now. So started with IV lasix and UOP has picked up nicely).   2. Anemia  - has had good outpt Hb's. Has required Feraheme in the past, never Aranesp. Started that here. Darbepoetin 100 mcg/week (1st dosed 9/30). Fe quite low - Feraheme dosed 10/2. Would personally have low threshold to transfuse given her CAD.   3. CAD - + trops falling. Several prior interventions. No plans for cath so far this trip. Risk outweighs benefit per cards.  4. DM - per primary service  5. Supratherapeutic INR - pharmacy monitoring. 6. Hypokalemia - KDur 30 BID X 2 doses today  Jamal Maes, MD [facility] [phone] pager 12/08/2016, 12:09 PM

## 2016-12-08 NOTE — Progress Notes (Signed)
Progress Note  Patient Name: [name] Date of Encounter: 12/08/2016  Primary Cardiologist: Bronson Ing  Subjective   Urine output continues to improve, net -2.7 L yesterday with further improvement in creatinine, down to 3.7. No weight yet today. Denies dyspnea, still has substantial edema. Has not had any angina. Remains in sinus rhythm.  Inpatient Medications    Scheduled Meds: . ALPRAZolam  0.5 mg Oral QHS  . amiodarone  200 mg Oral BID  . Chlorhexidine Gluconate Cloth  6 each Topical Daily  . darbepoetin (ARANESP) injection - NON-DIALYSIS  100 mcg Subcutaneous Q Sun-1800  . feeding supplement (GLUCERNA SHAKE)  237 mL Oral BID BM  . fluticasone  1 spray Each Nare Daily  . insulin aspart  0-5 Units Subcutaneous QHS  . insulin aspart  0-9 Units Subcutaneous TID WC  . insulin glargine  15 Units Subcutaneous QHS  . isosorbide mononitrate  30 mg Oral Daily  . latanoprost  1 drop Both Eyes QHS  . metoprolol succinate  25 mg Oral Q breakfast  . pantoprazole  40 mg Oral Daily  . polyethylene glycol  17 g Oral BID  . rosuvastatin  10 mg Oral QHS  . sodium chloride flush  10-40 mL Intracatheter Q12H  . sodium chloride flush  3 mL Intravenous Q12H  . warfarin  5 mg Oral Once  . Warfarin - Pharmacist Dosing Inpatient   Does not apply q1800   Continuous Infusions: . sodium chloride    . ferumoxytol Stopped (12/06/16 1900)  . furosemide Stopped (12/08/16 0708)   PRN Meds: sodium chloride, acetaminophen, albuterol, guaiFENesin-dextromethorphan, ondansetron (ZOFRAN) IV, sodium chloride flush, sodium chloride flush   Vital Signs    Vitals:   12/07/16 2300 12/08/16 0331 12/08/16 0400 12/08/16 0800  BP: (!) 122/58  137/64 (!) 132/56  Pulse:      Resp: 15  15   Temp: 97.9 F (36.6 C) 97.7 F (36.5 C)  (!) 96.2 F (35.7 C)  TempSrc: Oral Oral  Axillary  SpO2: 95%  97%   Weight:      Height:        Intake/Output Summary (Last 24 hours) at 12/08/16 1043 Last  data filed at 12/08/16 0600  Gross per 24 hour  Intake              312 ml  Output             2425 ml  Net            -2113 ml   Filed Weights   12/06/16 0500 12/07/16 0404 12/07/16 0451  Weight: 209 lb 7 oz (95 kg) 211 lb 3.2 oz (95.8 kg) 207 lb 10.8 oz (94.2 kg)    Telemetry    Normal sinus rhythm - Personally Reviewed  ECG    No new tracing - Personally Reviewed  Physical Exam  Comfortable, relaxed, sitting up at 30 head of bed elevation. GEN: No acute distress.   Neck: difficult to evaluate JVD Cardiac: RRR, no murmurs, rubs, or gallops.  Respiratory: Clear to auscultation bilaterally. GI: Soft, nontender, non-distended  MS: 2+ generalized edema; No deformity. Neuro:  Nonfocal  Psych: Normal affect   Labs    Chemistry Recent Labs Lab 12/04/16 1011  12/06/16 0335 12/07/16 0500 12/08/16 0608  NA 128*  < > 130* 134* 136  K 4.4  < > 4.1 3.8 3.2*  CL 92*  < > 92* 94* 94*  CO2 23  < > 27 28  30  GLUCOSE 289*  < > 184* 124* 171*  BUN 87*  < > 96* 88* 77*  CREATININE 4.39*  < > 4.65* 4.21* 3.79*  CALCIUM 8.3*  < > 8.4* 8.5* 8.5*  PROT 6.6  --   --   --   --   ALBUMIN 2.5*  --  2.3* 2.2* 2.3*  AST 51*  --   --   --   --   ALT 40  --   --   --   --   ALKPHOS 125  --   --   --   --   BILITOT 0.7  --   --   --   --   GFRNONAA 9*  < > 9* 10* 11*  GFRAA 11*  < > 10* 11* 13*  ANIONGAP 13  < > 11 12 12   < > = values in this interval not displayed.   Hematology Recent Labs Lab 12/05/16 0533 12/06/16 0335 12/07/16 0500  WBC 5.0 5.7 5.6  RBC 2.64* 2.74* 2.69*  HGB 7.7* 7.8* 8.1*  HCT 24.5* 25.5* 24.8*  MCV 92.8 93.1 92.2  MCH 29.2 28.5 30.1  MCHC 31.4 30.6 32.7  RDW 18.1* 17.5* 17.8*  PLT 283 294 294    Cardiac Enzymes Recent Labs Lab 12/04/16 0004 12/04/16 1011  TROPONINI 7.82* 5.78*   No results for input(s): TROPIPOC in the last 168 hours.   BNP Recent Labs Lab 12/04/16 1820  BNP 2,222.9*     DDimer No results for input(s): DDIMER in the  last 168 hours.   Radiology    Dg Chest 2 View  Result Date: 12/07/2016 CLINICAL DATA:  71 year old female with shortness of breath. Admitted with acute on chronic heart failure. EXAM: CHEST  2 VIEW COMPARISON:  12/05/2016 and earlier. FINDINGS: AP and lateral views of the chest. Continued moderate-sized veiling opacity at both lung bases and fluid tracking in the pleural fissures, including the right minor fissure. Stable pulmonary vascularity, no overt edema. No pneumothorax. Stable cardiac size and mediastinal contours. No acute osseous abnormality identified. Paucity of bowel gas in the upper abdomen. IMPRESSION: 1. Continued moderate bilateral pleural effusions with bibasilar collapse or consolidation. 2. Stable pulmonary vascularity without overt edema. Electronically Signed   By: Genevie Ann M.D.   On: 12/07/2016 08:19    Cardiac Studies   11/2016 nuclear stress: large inferolateralscar, LVEF 22%.  11/2015 cath: patent RCA stents, mod LAD disease, LCX CTO. Recs for medical thearpy.  11/2016 LVEF 30-35%, grade II diastolic dysfunciton   Patient Profile     71 y.o. female with CHF due to CAD (NSTEMI 06/2015 with DES to PDA and RCA at that time, subtotal occlusion of LCX treated medically; repeat cath 9/2017with stable disease; inferolateral scar w/o ischemia),LVEF 30-35%, grade II diastolic dysfunction, paroxysmal atrial fibrillation, right subclavian stenosis s/p intervention, DM2, HTN, HL, CKD IV admitted with SOB and fluid overload (initially to [facility]).    Assessment & Plan    1. CHF: Remains hypervolemic,but is making excellent progress with diuresis on intravenous furosemide infusion.  2. CAD:  asymptomatic, no evidence of meaningful areas of reversible ischemia on recent nuclear stress test. No plan for invasive evaluation despite recent small non-STEMI. 3. PAFib: no recurrence of arrhythmia on amiodarone. Plan to reduce the dose of amiodarone to maintenance level in about  a week. 4. Warfarin: INR now at therapeutic level.resume warfarin 5. Ac on chr renal failure: acute decompensation on background of diabetic-hypertensive nephropathy with  baseline creatinine in the low to mid 2 range.  For questions or updates, please contact [place] Please consult [email or web address] for contact info under Cardiology/STEMI.      Signed, Sanda Klein, MD  12/08/2016, 10:43 AM

## 2016-12-08 NOTE — NC FL2 (Signed)
[place] LEVEL OF CARE SCREENING TOOL     IDENTIFICATION  Patient Name: [name] Birthdate: 1945-10-17 Sex: female Admission Date (Current Location): 12/03/2016  [facility] and [place] Number:   ([place])   Facility and Address:  [facility], [place] [address] [place], [place] [address]      Provider Number: [id number]  Attending Physician Name and Address:  Jonetta Osgood, MD  Relative Name and Phone Number:  Purcell Nails, spouse, [phone]    Current Level of Care: Hospital Recommended Level of Care: [place] Prior Approval Number:    Date Approved/Denied:   PASRR Number:    Discharge Plan: SNF    Current Diagnoses: Patient Active Problem List   Diagnosis Date Noted  . Goals of care, counseling/discussion   . Palliative care encounter   . Hypercoagulopathy ([place]) 12/03/2016  . Acute on chronic heart failure ([place]) 12/03/2016  . Ischemic cardiomyopathy   . Generalized weakness   . Atrial fibrillation with RVR ([place]) 11/19/2016  . Abnormal nuclear stress test   . Left ventricular dysfunction   . Atrial fibrillation ([place]) 11/13/2016  . Anemia in chronic kidney disease   . Atrial fibrillation, new onset ([place]) 11/10/2016  . Hypoglycemia 11/10/2016  . Pleural effusion on right 10/31/2016  . HCAP (healthcare-associated pneumonia) 10/31/2016  . DM type 2 causing vascular disease ([place]) 10/05/2016  . Personal history of noncompliance with medical treatment, presenting hazards to health 08/22/2016  . Hyperglycemia 08/07/2016  . Pulmonary edema 07/14/2016  . CKD (chronic kidney disease), stage IV ([place]) 07/14/2016  . Acute respiratory failure with hypoxia ([place]) 07/09/2016  . Acute on chronic systolic CHF (congestive heart failure) ([place]) 12/01/2015  . AKI (acute kidney injury) ([place])   . Ischemia of upper extremity   . Right knee pain   . Subclavian artery stenosis, right ([place]) 07/07/2015  . Acute on chronic  combined systolic and diastolic CHF (congestive heart failure) ([place]) 07/04/2015  . Elevated troponin 07/04/2015  . Acute renal failure superimposed on chronic kidney disease ([place]) 07/04/2015  . Non-ST elevation (NSTEMI) myocardial infarction ([place]) 07/04/2015  . Elevated d-dimer 07/04/2015  . Acute respiratory failure with hypercapnia ([place])   . Bilateral lower extremity edema 01/20/2012  . Type 2 diabetes mellitus with stage 4 chronic kidney disease, with long-term current use of insulin ([place]) 01/21/2011  . Overweight 07/20/2009  . Coronary artery disease 10/08/2008  . HEART MURMUR, SYSTOLIC 32/20/2542  . SHOULDER PAIN 02/05/2007  . Hyperlipemia 04/11/2006  . Anxiety state 04/11/2006  . SYNDROME, CARPAL TUNNEL 04/11/2006  . Essential hypertension 04/11/2006  . ALLERGIC RHINITIS 04/11/2006  . GERD 04/11/2006  . Constipation 04/11/2006  . OVERACTIVE BLADDER 04/11/2006  . OSTEOARTHRITIS 04/11/2006  . LOW BACK PAIN 04/11/2006    Orientation RESPIRATION BLADDER Height & Weight     Self, Time, Situation, Place  Normal Continent, Indwelling catheter Weight: 94.2 kg (207 lb 10.8 oz) Height:  5\' 8"  (172.7 cm)  BEHAVIORAL SYMPTOMS/MOOD NEUROLOGICAL BOWEL NUTRITION STATUS      Continent Diet (Please see DC Summary)  AMBULATORY STATUS COMMUNICATION OF NEEDS Skin   Limited Assist Verbally Normal                       Personal Care Assistance Level of Assistance  Bathing, Feeding, Dressing Bathing Assistance: Limited assistance Feeding assistance: Independent Dressing Assistance: Limited assistance     Functional Limitations Info  SPECIAL CARE FACTORS FREQUENCY  PT (By licensed PT), OT (By licensed OT)     PT Frequency: 5x/week OT Frequency: 3x/week            Contractures      Additional Factors Info  Code Status, Allergies, Psychotropic, Insulin Sliding Scale Code Status Info: Full Allergies Info:  Ace Inhibitors, Amlodipine, Codeine Psychotropic  Info: Xanax Insulin Sliding Scale Info: 3x daily with meals and at bedtime       Current Medications (12/08/2016):  This is the current hospital active medication list Current Facility-Administered Medications  Medication Dose Route Frequency Provider Last Rate Last Dose  . 0.9 %  sodium chloride infusion  250 mL Intravenous PRN Radene Gunning, NP      . acetaminophen (TYLENOL) tablet 650 mg  650 mg Oral Q4H PRN Radene Gunning, NP      . albuterol (PROVENTIL) (2.5 MG/3ML) 0.083% nebulizer solution 2.5 mg  2.5 mg Nebulization Q6H PRN Radene Gunning, NP      . ALPRAZolam Duanne Moron) tablet 0.5 mg  0.5 mg Oral QHS Radene Gunning, NP   0.5 mg at 12/07/16 2145  . amiodarone (PACERONE) tablet 200 mg  200 mg Oral BID Radene Gunning, NP   200 mg at 12/08/16 5053  . Chlorhexidine Gluconate Cloth 2 % PADS 6 each  6 each Topical Daily Aline August, MD   6 each at 12/04/16 1749  . Darbepoetin Alfa (ARANESP) injection 100 mcg  100 mcg Subcutaneous Q Sun-1800 Corliss Parish, MD   100 mcg at 12/04/16 1748  . feeding supplement (GLUCERNA SHAKE) (GLUCERNA SHAKE) liquid 237 mL  237 mL Oral BID BM Pershing Proud, NP   976 mL at 12/07/16 1703  . ferumoxytol (FERAHEME) 510 mg in sodium chloride 0.9 % 100 mL IVPB  510 mg Intravenous Weekly Jamal Maes, MD   Stopped at 12/06/16 1900  . fluticasone (FLONASE) 50 MCG/ACT nasal spray 1 spray  1 spray Each Nare Daily Radene Gunning, NP   1 spray at 12/08/16 0908  . furosemide (LASIX) 120 mg in dextrose 5 % 50 mL IVPB  120 mg Intravenous Q12H Jamal Maes, MD   Stopped at 12/08/16 7341  . guaiFENesin-dextromethorphan (ROBITUSSIN DM) 100-10 MG/5ML syrup 15 mL  15 mL Oral Q4H PRN Aline August, MD   15 mL at 12/07/16 2151  . insulin aspart (novoLOG) injection 0-5 Units  0-5 Units Subcutaneous QHS Radene Gunning, NP   4 Units at 12/07/16 2158  . insulin aspart (novoLOG) injection 0-9 Units  0-9 Units Subcutaneous TID WC Radene Gunning, NP   3 Units at 12/08/16  9379  . insulin glargine (LANTUS) injection 15 Units  15 Units Subcutaneous QHS Radene Gunning, NP   15 Units at 12/07/16 2158  . isosorbide mononitrate (IMDUR) 24 hr tablet 30 mg  30 mg Oral Daily Radene Gunning, NP   30 mg at 12/08/16 0240  . latanoprost (XALATAN) 0.005 % ophthalmic solution 1 drop  1 drop Both Eyes QHS Radene Gunning, NP   1 drop at 12/07/16 2151  . metoprolol succinate (TOPROL-XL) 24 hr tablet 25 mg  25 mg Oral Q breakfast Jamal Maes, MD   25 mg at 12/08/16 0907  . ondansetron (ZOFRAN) injection 4 mg  4 mg Intravenous Q6H PRN Radene Gunning, NP   4 mg at 12/04/16 0230  . pantoprazole (PROTONIX) EC tablet 40 mg  40 mg Oral Daily Black, Lezlie Octave, NP  40 mg at 12/08/16 0907  . polyethylene glycol (MIRALAX / GLYCOLAX) packet 17 g  17 g Oral BID Pershing Proud, NP   17 g at 12/08/16 0908  . rosuvastatin (CRESTOR) tablet 10 mg  10 mg Oral QHS Croitoru, Mihai, MD   10 mg at 12/07/16 2145  . sodium chloride flush (NS) 0.9 % injection 10-40 mL  10-40 mL Intracatheter Q12H Aline August, MD   10 mL at 12/08/16 0608  . sodium chloride flush (NS) 0.9 % injection 10-40 mL  10-40 mL Intracatheter PRN Alekh, Kshitiz, MD      . sodium chloride flush (NS) 0.9 % injection 3 mL  3 mL Intravenous Q12H Radene Gunning, NP   3 mL at 12/07/16 1129  . sodium chloride flush (NS) 0.9 % injection 3 mL  3 mL Intravenous PRN Radene Gunning, NP   3 mL at 12/08/16 0910  . warfarin (COUMADIN) tablet 5 mg  5 mg Oral Once Reginia Naas, RPH      . Warfarin - Pharmacist Dosing Inpatient   Does not apply q1800 Bajbus, Almeta Monas, [facility]   Stopped at 12/04/16 1800     Discharge Medications: Please see discharge summary for a list of discharge medications.  Relevant Imaging Results:  Relevant Lab Results:   Additional Information SSN: [place], [place]

## 2016-12-08 NOTE — Progress Notes (Signed)
[place] for warfarin Indication: atrial fibrillation  Allergies  Allergen Reactions  . Ace Inhibitors Cough  . Amlodipine Other (See Comments)    Edema   . Codeine Rash    Patient Measurements: Height: 5\' 8"  (172.7 cm) Weight: 207 lb 10.8 oz (94.2 kg) IBW/kg (Calculated) : 63.9  Assessment: 71 YOF on Coumadin alternating 7.5mg  and 3.75mg  every other day PTA for Afib per records from [place] = 5. Has been on clindamycin and trimethoprim recently. Also on amiodarone - new start this admit (had RX but not started PTA). INR was elevated on admit and now trending down to 2.69 after holding Coumadin. Amio interaction, CHF hepatic dysfunction. Hgb 8.1, plts wnl.  Goal of Therapy:  INR 2-3 Monitor platelets by anticoagulation protocol: Yes   Plan:  Give Coumadin 5mg  PO x 1 today Monitor daily INR, CBC, s/s of bleed  Elenor Quinones, PharmD, [facility] Clinical Pharmacist Pager [phone] 12/08/2016 8:50 AM

## 2016-12-08 NOTE — Progress Notes (Addendum)
PROGRESS NOTE        PATIENT DETAILS Name: [name] Age: 71 y.o. Sex: female Date of Birth: 03-29-1945 Admit Date: 12/03/2016 Admitting Physician Debbe Odea, MD JTT:SVXB, Berneta Sages, MD  Brief Narrative: Patient is a 71 y.o. female with history of CAD status post non-STEMI in 2017-status post PCI, chronic systolic/diastolic heart failure, right subclavian stenosis status post thrombectomy in 2017 and July 2018 admitted with decompensated heart failure and worsening renal function. See below for further details  Subjective: Feels overall better-wants a letter for work [employer], [employer] will let SW know)  Assessment/Plan: Principal Problem: Acute on chronic combined systolic and diastolic CHF (EF 93-90% on 11/15/16): improving, volume status improving-continue diuretics per nephrology.   Acute on chronic kidney disease stage III: AKI hemodynamically mediated-improving with supportive care-creatinine continues to downtrend.Renal continues to follow.  Hypokalemia:replete and recheck  Acute respiratory failure with hypoxia: likely secondary to decompensated heart failure, improving.  Paroxysmal Atrial fibrillation: In sinus rhythm-continue with beta blockerand amiodarone-chads2vasc round 5.Continue to dose coumadin per pharmacy.  Non-STEMI: troponin significantly elevated-a recent nuc stress test on 9/11-without ischemia, has known hx of CAD-remains on Metoprolol,statin. Cards following, per cardiology-no plans for invasive procedures at this time.   History of CAD status post PCI in 2017 : see above-has hx of PCI in April 2017 following NSTEMI  History of pleural effusion: Reviewed discharge summary on 9/23-apparently patient had undergone a recent thoracocentesis-pleural effusion was thought to be secondary to transudate. Currently hypoxia is improved, volume status is improving with diuretics-do not see any indication for thoracocentesis at this time. Pleural effusion  is likely related to CHF.  Insulin-dependent DM-2: CBG stable-continue with Lantus and SSI  Anemia:probably 2/2 CKD-on Feraheme/Aranesp per Renal.    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  MEDICATIONS: Scheduled Meds: . ALPRAZolam  0.5 mg Oral QHS  . amiodarone  200 mg Oral BID  . Chlorhexidine Gluconate Cloth  6 each Topical Daily  . darbepoetin (ARANESP) injection - NON-DIALYSIS  100 mcg Subcutaneous Q Sun-1800  . feeding supplement (GLUCERNA SHAKE)  237 mL Oral BID BM  . fluticasone  1 spray Each Nare Daily  . insulin aspart  0-5 Units Subcutaneous QHS  . insulin aspart  0-9 Units Subcutaneous TID WC  . insulin glargine  15 Units Subcutaneous QHS  . isosorbide mononitrate  30 mg Oral Daily  . latanoprost  1 drop Both Eyes QHS  . metoprolol succinate  25 mg Oral Q breakfast  . pantoprazole  40 mg Oral Daily  . polyethylene glycol  17 g Oral BID  . potassium chloride  30 mEq Oral BID  . rosuvastatin  10 mg Oral QHS  . sodium chloride flush  10-40 mL Intracatheter Q12H  . sodium chloride flush  3 mL Intravenous Q12H  . Warfarin - Pharmacist  Dosing Inpatient   Does not apply q1800   Continuous Infusions: . sodium chloride    . ferumoxytol Stopped (12/06/16 1900)  . furosemide Stopped (12/08/16 0708)   PRN Meds:.sodium chloride, acetaminophen,  albuterol, guaiFENesin-dextromethorphan, ondansetron (ZOFRAN) IV, sodium chloride flush, sodium chloride flush   PHYSICAL EXAM: Vital signs: Vitals:   12/08/16 0400 12/08/16 0800 12/08/16 1200 12/08/16 1504  BP: 137/64 (!) 132/56 (!) 142/72 (!) 160/127  Pulse:      Resp: 15     Temp:  (!) 96.2 F (35.7 C) 98 F (36.7 C) 97.6 F (36.4 C)  TempSrc:  Axillary Oral Oral  SpO2: 97%     Weight:      Height:       Filed Weights   12/06/16 0500 12/07/16 0404 12/07/16 0451  Weight: 95 kg (209 lb 7 oz) 95.8 kg (211 lb 3.2 oz) 94.2 kg (207 lb 10.8 oz)   Body mass index is 31.58 kg/m.   General appearance :Awake, alert Eyes:, pupils equally reactive to light and accomodation HEENT: Atraumatic and Normocephalic Neck: supple, no JVD. Resp:Good air entry bilaterally CVS: S1 S2 regular GI: Bowel sounds present, Non tender and not distended with no gaurding, rigidity or rebound. Extremities: B/L Lower Ext shows no edema, both legs are warm to touch Neurology:  speech clear,Non focal, sensation is grossly intact. Musculoskeletal:No digital cyanosis Skin:No Rash, warm and dry Wounds:N/A  I have personally reviewed following labs and imaging studies  LABORATORY DATA: CBC:  Recent Labs Lab 12/04/16 0003 12/05/16 0533 12/06/16 0335 12/07/16 0500  WBC 7.0 5.0 5.7 5.6  NEUTROABS  --  3.7 4.4 4.2  HGB 8.2* 7.7* 7.8* 8.1*  HCT 26.2* 24.5* 25.5* 24.8*  MCV 92.9 92.8 93.1 92.2  PLT 269 283 294 841    Basic Metabolic Panel:  Recent Labs Lab 12/04/16 1011 12/05/16 0533 12/06/16 0335 12/07/16 0500 12/08/16 0608  NA 128* 131* 130* 134* 136  K 4.4 4.4 4.1 3.8 3.2*  CL 92* 93* 92* 94* 94*  CO2 23 29 27 28 30   GLUCOSE 289* 172* 184* 124* 171*  BUN 87* 94* 96* 88* 77*  CREATININE 4.39* 4.75* 4.65* 4.21* 3.79*  CALCIUM 8.3* 8.4* 8.4* 8.5* 8.5*  MG  --  2.7* 2.7* 2.6*  --   PHOS  --   --  5.7* 5.3* 4.5    GFR: Estimated Creatinine Clearance: 16.1 mL/min (A) (by C-G formula  based on SCr of 3.79 mg/dL (H)).  Liver Function Tests:  Recent Labs Lab 12/04/16 1011 12/06/16 0335 12/07/16 0500 12/08/16 0608  AST 51*  --   --   --   ALT 40  --   --   --   ALKPHOS 125  --   --   --   BILITOT 0.7  --   --   --   PROT 6.6  --   --   --   ALBUMIN 2.5* 2.3* 2.2* 2.3*   No results for input(s): LIPASE, AMYLASE in the last 168 hours. No results for input(s): AMMONIA in the last 168 hours.  Coagulation Profile:  Recent Labs Lab 12/04/16 0003 12/05/16 0533 12/06/16 0335 12/07/16 0500 12/08/16 0608  INR 6.71* 4.51* 4.24* 3.48 2.69    Cardiac Enzymes:  Recent Labs Lab 12/04/16 0004 12/04/16 1011  TROPONINI 7.82* 5.78*    BNP (last 3 results) No results for input(s): PROBNP in the last 8760 hours.  HbA1C: No results for input(s): HGBA1C in the last 72 hours.  CBG:  Recent Labs Lab 12/07/16 1352 12/07/16 1551 12/07/16 2108 12/08/16 0844 12/08/16 1228  GLUCAP 218* 202* 302* 162* 193*    Lipid Profile: No results for input(s): CHOL, HDL, LDLCALC, TRIG, CHOLHDL, LDLDIRECT in the last 72 hours.  Thyroid Function Tests: No results for input(s): TSH, T4TOTAL, FREET4, T3FREE, THYROIDAB in the last 72 hours.  Anemia Panel:  Recent Labs  12/06/16 0335  TIBC 276  IRON 23*    Urine analysis:    Component Value Date/Time   COLORURINE YELLOW 12/04/2016 1425   APPEARANCEUR TURBID (A) 12/04/2016 1425   LABSPEC 1.013 12/04/2016 1425   PHURINE 5.0 12/04/2016 1425   GLUCOSEU NEGATIVE 12/04/2016 1425   HGBUR LARGE (A) 12/04/2016 1425   BILIRUBINUR NEGATIVE 12/04/2016 1425   KETONESUR NEGATIVE 12/04/2016 1425   PROTEINUR 100 (A) 12/04/2016 1425   UROBILINOGEN 0.2 08/23/2007 2037   NITRITE NEGATIVE 12/04/2016 1425   LEUKOCYTESUR LARGE (A) 12/04/2016 1425    Sepsis Labs: Lactic Acid, Venous    Component Value Date/Time   LATICACIDVEN 0.65 10/31/2016 0838    MICROBIOLOGY: Recent Results (from the past 240 hour(s))  MRSA PCR  Screening     Status: None   Collection Time: 12/03/16 10:41 AM  Result Value Ref Range Status   MRSA by PCR NEGATIVE NEGATIVE Final    Comment:        The GeneXpert MRSA Assay (FDA approved for NASAL specimens only), is one component of a comprehensive MRSA colonization surveillance program. It is not intended to diagnose MRSA infection nor to guide or monitor treatment for MRSA infections.     RADIOLOGY STUDIES/RESULTS: Dg Chest 1 View  Result Date: 11/21/2016 CLINICAL DATA:  Pleural effusions, right greater than left. EXAM: CHEST 1 VIEW COMPARISON:  11/20/2016 FINDINGS: Right pleural effusion has been eliminated. There is a moderate left effusion which appears slightly more prominent than on the prior study. Overall heart size and pulmonary vascularity are normal. No pneumothorax after thoracentesis. IMPRESSION: No pneumothorax after right thoracentesis. No visible residual right pleural fluid. Moderate left effusion appears slightly increased. Electronically Signed   By: Lorriane Shire M.D.   On: 11/21/2016 13:08   Dg Chest 2 View  Result Date: 12/07/2016 CLINICAL DATA:  71 year old female with shortness of breath. Admitted with acute on chronic heart failure. EXAM: CHEST  2 VIEW COMPARISON:  12/05/2016 and earlier. FINDINGS: AP and lateral views of the chest. Continued moderate-sized veiling opacity at both lung bases and fluid tracking in the pleural fissures, including the right minor fissure. Stable pulmonary vascularity, no overt edema. No pneumothorax. Stable cardiac size and mediastinal contours. No acute osseous abnormality identified. Paucity of bowel gas in the upper abdomen. IMPRESSION: 1. Continued moderate bilateral pleural effusions with bibasilar collapse or consolidation. 2. Stable pulmonary vascularity without overt edema. Electronically Signed   By: Genevie Ann M.D.   On: 12/07/2016 08:19   Dg Chest 2 View  Result Date: 12/05/2016 CLINICAL DATA:  71 year old female with  history of dyspnea. EXAM: CHEST  2 VIEW COMPARISON:  Chest x-ray 12/03/2016. FINDINGS: Persistent moderate right and small left pleural effusions. Bibasilar opacities (right greater than left), suggest superimposed areas of subsegmental atelectasis, although underlying airspace consolidation is not entirely excluded. No evidence of pulmonary edema. Heart size is normal. Upper mediastinal contours are within normal limits. IMPRESSION: 1. Persistent moderate right and small left pleural effusions with associated areas of probable passive subsegmental atelectasis throughout the lung bases bilaterally. Electronically Signed   By: Vinnie Langton  M.D.   On: 12/05/2016 07:53   Dg Chest 2 View  Result Date: 11/19/2016 CLINICAL DATA:  Short of breath. Discharged 3 days ago with congestive heart failure. EXAM: CHEST  2 VIEW COMPARISON:  11/10/2016 FINDINGS: Degraded lateral view secondary to positioning and overlying artifact. Midline trachea. Normal heart size for level of inspiration. Increase in moderate right pleural effusion. No pneumothorax. Low lung volumes. Mild pulmonary venous congestion. Worsened right and developing mild left base airspace disease. IMPRESSION: Increase in moderate right pleural effusion with adjacent atelectasis or infection. Mild pulmonary venous congestion with minimal left base atelectasis. Electronically Signed   By: Abigail Miyamoto M.D.   On: 11/19/2016 09:08   Ct Chest Wo Contrast  Result Date: 11/19/2016 CLINICAL DATA:  Shortness of breath. EXAM: CT CHEST WITHOUT CONTRAST TECHNIQUE: Multidetector CT imaging of the chest was performed following the standard protocol without IV contrast. COMPARISON:  Chest x-ray from same day. CT chest dated October 31, 2016. FINDINGS: Cardiovascular: Mild cardiomegaly, unchanged. New small pericardial effusion. Coronary, aortic arch, and branch vessel atherosclerotic vascular disease. Mediastinum/Nodes: Prominent and mildly enlarged mediastinal lymph  nodes are stable to slightly decreased in size. No axillary lymphadenopathy. The thyroid gland, trachea, and esophagus are unremarkable. Lungs/Pleura: Large right and small left pleural effusions. Right greater than left bibasilar atelectasis. Additional atelectasis in the right middle lobe. No suspicious pulmonary nodules. No consolidation or pneumothorax. Upper Abdomen: No acute abnormality. Musculoskeletal: No chest wall mass or suspicious bone lesions identified. No fracture. Unchanged degenerative changes of the thoracic spine, worst at T7-T8 and T10-T11. IMPRESSION: 1. Large right and small left pleural effusions with adjacent right greater than left atelectasis. 2. New small pericardial effusion. 3. Stable to slightly decreased in size mildly enlarged mediastinal lymph nodes, likely reactive. 4.  Aortic atherosclerosis (ICD10-I70.0). Electronically Signed   By: Titus Dubin M.D.   On: 11/19/2016 11:16   Dg Chest Right Decubitus  Result Date: 11/19/2016 CLINICAL DATA:  Right pleural effusion. EXAM: CHEST - RIGHT DECUBITUS COMPARISON:  Chest x-ray from earlier today and CT of the chest November 19, 2016 FINDINGS: The right-side-down decubitus film demonstrates a layering right-sided effusion which correlates with comparison imaging. IMPRESSION: The right-sided pleural effusion layers on this study. Electronically Signed   By: Dorise Bullion III M.D   On: 11/19/2016 15:29   Nm Myocar Multi W/spect Tamela Oddi Motion / Ef  Result Date: 11/15/2016  There was no ST segment deviation noted during stress. T wave inversions inferiorly.  Defect 1: There is a large defect of severe severity present in the basal inferolateral, basal anterolateral, mid anterior, mid anteroseptal, mid inferolateral, mid anterolateral, apical anterior, apical inferior, apical lateral and apex location.  This is a high risk study.  Findings consistent with prior myocardial infarction. No ischemic territories.  Nuclear stress EF:  21%.    Dg Chest Port 1 View  Result Date: 12/03/2016 CLINICAL DATA:  Shortness of breath. EXAM: PORTABLE CHEST 1 VIEW COMPARISON:  12/01/2016 FINDINGS: Bilateral lower lung opacities and bilateral pleural effusions again noted. Visualized cardiomediastinal silhouette is unchanged. No pneumothorax. There has been little interval change since the prior study. IMPRESSION: Unchanged appearance of the chest with bilateral lower lung opacities/ atelectasis and bilateral pleural effusions. Electronically Signed   By: Margarette Canada M.D.   On: 12/03/2016 15:09   Dg Chest Port 1 View  Result Date: 11/20/2016 CLINICAL DATA:  Shortness of breath EXAM: PORTABLE CHEST 1 VIEW COMPARISON:  11/19/2016 FINDINGS: Large right and small to moderate  left pleural effusions causing hazy opacities obscuring the lung bases. Mild enlargement of the cardiopericardial silhouette. No findings of acute pulmonary edema. The right pleural effusion has a greater layering effect than on 11/19/2016, possibly due to lack of complete settling prior to imaging. IMPRESSION: 1. Similar appearance of large right and small to moderate left pleural effusions with passive atelectasis. 2. Mild enlargement of the cardiopericardial silhouette. Electronically Signed   By: Van Clines M.D.   On: 11/20/2016 12:09   Dg Chest Port 1 View  Result Date: 11/10/2016 CLINICAL DATA:  Shortness of breath tonight EXAM: PORTABLE CHEST 1 VIEW COMPARISON:  October 31, 2016 FINDINGS: The mediastinal contour is normal. The heart size is mildly enlarged. There is a small right pleural effusion with patchy consolidation of right lung base. The left lung is clear. No acute abnormalities identified in the visualized bones. IMPRESSION: Small right pleural effusion with patchy consolidation of right lung base, underline pneumonia is not excluded. Electronically Signed   By: Abelardo Diesel M.D.   On: 11/10/2016 21:28   US Thoracentesis Asp Pleural Space W/img  Guide  Result Date: 11/21/2016 INDICATION: Right pleural effusion. EXAM: ULTRASOUND GUIDED RIGHT THORACENTESIS MEDICATIONS: None. COMPLICATIONS: None immediate. PROCEDURE: An ultrasound guided thoracentesis was thoroughly discussed with the patient and questions answered. The benefits, risks, alternatives and complications were also discussed. The patient understands and wishes to proceed with the procedure. Written consent was obtained. Ultrasound was performed to localize and mark an adequate pocket of fluid in the RIGHT chest. The area was then prepped and draped in the normal sterile fashion. 1% Lidocaine was used for local anesthesia. Under ultrasound guidance a Yueh catheter was introduced. Thoracentesis was performed. The catheter was removed and a dressing applied. FINDINGS: A total of approximately 1.7 L of dark blood-tinged fluid was removed. Samples were sent to the laboratory as requested by the clinical team. IMPRESSION: Successful ultrasound guided right thoracentesis yielding 1.7 L of pleural fluid. Electronically Signed   By: Lorriane Shire M.D.   On: 11/21/2016 13:09     LOS: 5 days   Oren Binet, MD  [facility] Pager:[phone]  If 7PM-7AM, please contact night-coverage [email or web address] Password [email or web address] 12/08/2016, 3:25 PM

## 2016-12-09 LAB — RENAL FUNCTION PANEL
ALBUMIN: 2.4 g/dL — AB (ref 3.5–5.0)
Anion gap: 12 (ref 5–15)
BUN: 66 mg/dL — ABNORMAL HIGH (ref 6–20)
CALCIUM: 8.8 mg/dL — AB (ref 8.9–10.3)
CO2: 31 mmol/L (ref 22–32)
CREATININE: 3.51 mg/dL — AB (ref 0.44–1.00)
Chloride: 94 mmol/L — ABNORMAL LOW (ref 101–111)
GFR calc Af Amer: 14 mL/min — ABNORMAL LOW (ref >60)
GFR, EST NON AFRICAN AMERICAN: 12 mL/min — AB (ref >60)
Glucose, Bld: 146 mg/dL — ABNORMAL HIGH (ref 65–99)
PHOSPHORUS: 3.9 mg/dL (ref 2.5–4.6)
Potassium: 3.7 mmol/L (ref 3.5–5.1)
SODIUM: 137 mmol/L (ref 135–145)

## 2016-12-09 LAB — CBC
HCT: 26.4 % — ABNORMAL LOW (ref 36.0–46.0)
Hemoglobin: 7.9 g/dL — ABNORMAL LOW (ref 12.0–15.0)
MCH: 28.4 pg (ref 26.0–34.0)
MCHC: 29.9 g/dL — ABNORMAL LOW (ref 30.0–36.0)
MCV: 95 fL (ref 78.0–100.0)
PLATELETS: 302 10*3/uL (ref 150–400)
RBC: 2.78 MIL/uL — ABNORMAL LOW (ref 3.87–5.11)
RDW: 18 % — AB (ref 11.5–15.5)
WBC: 6.6 10*3/uL (ref 4.0–10.5)

## 2016-12-09 LAB — GLUCOSE, CAPILLARY
GLUCOSE-CAPILLARY: 149 mg/dL — AB (ref 65–99)
GLUCOSE-CAPILLARY: 178 mg/dL — AB (ref 65–99)
Glucose-Capillary: 249 mg/dL — ABNORMAL HIGH (ref 65–99)
Glucose-Capillary: 313 mg/dL — ABNORMAL HIGH (ref 65–99)

## 2016-12-09 LAB — PROTIME-INR
INR: 2.92
PROTHROMBIN TIME: 30.3 s — AB (ref 11.4–15.2)

## 2016-12-09 MED ORDER — DARBEPOETIN ALFA 200 MCG/0.4ML IJ SOSY
200.0000 ug | PREFILLED_SYRINGE | INTRAMUSCULAR | Status: DC
  Administered 2016-12-11: 200 ug via SUBCUTANEOUS
  Filled 2016-12-09: qty 0.4

## 2016-12-09 MED ORDER — INSULIN GLARGINE 100 UNIT/ML ~~LOC~~ SOLN
18.0000 [IU] | Freq: Every day | SUBCUTANEOUS | Status: DC
  Administered 2016-12-09 – 2016-12-11 (×3): 18 [IU] via SUBCUTANEOUS
  Filled 2016-12-09 (×4): qty 0.18

## 2016-12-09 NOTE — Progress Notes (Signed)
Occupational Therapy Treatment Patient Details Name: [name] MRN: [id number] DOB: Oct 08, 1945 Today's Date: 12/09/2016    History of present illness Patient is a 71 y/o female who presents with worsening SOB. Admitted with acute on chronic heart failure and acute on chronic kidney disease. CXR-Persistent moderate right and small left pleural effusions. Recent hospital admission at [facility]. 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   OT comments  Pt progressing towards OT goals. In addition to toilet transfer, initiated energy conservation education including handout. Talked with Pt and daughter about SNF vs HH differences. Due to unsteady, and decreased independence in ADL SNF remains the most appropriate discharge recommendation at this time, will monitor for more progress in future therapy sessions. OT will continue to follow.   Follow Up Recommendations  SNF;Supervision/Assistance - 24 hour (to maximize initial therapy and for safety)    Equipment Recommendations  3 in 1 bedside commode    Recommendations for Other Services      Precautions / Restrictions Precautions Precautions: Fall Restrictions Weight Bearing Restrictions: No       Mobility Bed Mobility Overal bed mobility: Needs Assistance Bed Mobility: Rolling;Sidelying to Sit Rolling: Min guard Sidelying to sit: Min guard       General bed mobility comments: Pt OOB in recliner when OT entered  Transfers Overall transfer level: Needs assistance Equipment used: Rolling walker (2 wheeled) Transfers: Sit to/from Stand Sit to Stand: Min assist         General transfer comment: Assist for power up and for balance/stability    Balance Overall balance assessment: Needs assistance Sitting-balance support: No upper extremity supported;Feet supported Sitting balance-[name]: Fair     Standing balance  support: Bilateral upper extremity supported Standing balance-[name]: Poor Standing balance comment: walker and min assist for static standing                           ADL either performed or assessed with clinical judgement   ADL Overall ADL's : Needs assistance/impaired Eating/Feeding: Modified independent;Sitting Eating/Feeding Details (indicate cue type and reason): finishing up lunch when OT entered Grooming: Set up;Sitting;Wash/dry face Grooming Details (indicate cue type and reason): in recliner                 Toilet Transfer: Minimal assistance;Stand-pivot;BSC;RW Toilet Transfer Details (indicate cue type and reason): assist for balance, shared her fall was from her [facility] at home Toileting- Clothing Manipulation and Hygiene: Maximal assistance;Sit to/from stand Toileting - Clothing Manipulation Details (indicate cue type and reason): assist for peri care     Functional mobility during ADLs: Minimal assistance;Min guard;Rolling walker (stand pivot only this session) General ADL Comments: Initiated energy conservation education including handout     Vision       Perception     Praxis      Cognition Arousal/Alertness: Awake/alert Behavior During Therapy: WFL for tasks assessed/performed Overall Cognitive Status: Within Functional Limits for tasks assessed                                          Exercises     Shoulder Instructions       General Comments Daughters present during session    Pertinent Vitals/ Pain       Pain Assessment: No/denies  pain  Home Living                                          Prior Functioning/Environment              Frequency  Min 2X/week        Progress Toward Goals  OT Goals(current goals can now be found in the care plan section)  Progress towards OT goals: Progressing toward goals  Acute Rehab OT Goals Patient Stated Goal: go home OT Goal Formulation:  With patient Time For Goal Achievement: 12/20/16 Potential to Achieve Goals: Good  Plan Discharge plan remains appropriate;Frequency remains appropriate    Co-evaluation                 AM-PAC PT "6 Clicks" Daily Activity     Outcome Measure   Help from another person eating meals?: None Help from another person taking care of personal grooming?: A Little Help from another person toileting, which includes using toliet, bedpan, or urinal?: A Little Help from another person bathing (including washing, rinsing, drying)?: A Lot Help from another person to put on and taking off regular upper body clothing?: A Little Help from another person to put on and taking off regular lower body clothing?: A Lot 6 Click Score: 17    End of Session Equipment Utilized During Treatment: Rolling walker;Gait belt  OT Visit Diagnosis: Unsteadiness on feet (R26.81)   Activity Tolerance Patient limited by fatigue   Patient Left in chair;with call bell/phone within reach;with chair alarm set   Nurse Communication Mobility status        Time: 1446-1510 OT Time Calculation (min): 24 min  Charges: OT General Charges $OT Visit: 1 Visit OT Treatments $Self Care/Home Management : 23-37 mins  Hulda Humphrey OTR/L [phone]  Merri Ray Marquist Binstock 12/09/2016, 4:45 PM

## 2016-12-09 NOTE — Progress Notes (Signed)
PROGRESS NOTE        PATIENT DETAILS Name: [name] Age: 71 y.o. Sex: female Date of Birth: [birth year] Admit Date: 12/03/2016 Admitting Physician Debbe Odea, MD TOI:ZTIW, Berneta Sages, MD  Brief Narrative: Patient is a 71 y.o. female with history of CAD status post non-STEMI in 2017-status post PCI, chronic systolic/diastolic heart failure, right subclavian stenosis status post thrombectomy in 2017 and July 2018 admitted with decompensated heart failure and worsening renal function. See below for further details  Subjective: Looks better-less edema.  Denies chest pain or shortness of breath.  Assessment/Plan: Principal Problem: Acute on chronic combined systolic and diastolic CHF (EF 58-09% on 11/15/16): Volume status continues to slowly improve, -7.4 L so far-however still has significant amount of lower extremity edema. Continue diuretics, appreciate nephrology input.  Acute on chronic kidney disease stage III: AKI hemodynamically mediated, creatinine continues to downtrend with supportive care. Continue to follow electrolytes. Appreciate nephrology input  Hypokalemia: Repleted-continue to follow periodically.  Acute respiratory failure with hypoxia: Resolved-now on room air, likely secondary to decompensated heart failure.   Paroxysmal Atrial fibrillation: Rate controlled-continue with beta blocker and amiodarone. Chads2vasc of around 5. INR therapeutic-Coumadin per pharmacy.   Non-STEMI: troponin significantly elevated-a recent nuc stress test on 9/11-without ischemia, has known hx of CAD-remains on Metoprolol,statin. Cards following, per cardiology-no plans for invasive procedures at this time.   History of CAD status post PCI in 2017: see above-has hx of PCI in April 2017 following NSTEMI  History of pleural effusion: Reviewed discharge summary on 9/23-apparently patient had undergone a recent thoracocentesis-pleural effusion was thought to be  secondary to transudate. Currently hypoxia is improved, volume status is improving with diuretics-do not see any indication for thoracocentesis at this time. Pleural effusion is likely related to CHF.  Insulin-dependent DM-2: CBG on the higher side-increase Lantus to 18 units, continue SSI and follow   Anemia: Secondary to chronic kidney disease-on darbepoetin and IV iron per nephrology.    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 Suspect will require SNF on discharge.  Antimicrobial agents: Anti-infectives    None     Procedures: None  CONSULTS:  cardiology, nephrology and Palliative care  Time spent: 25- minutes-Greater than 50% of this time was spent in counseling, explanation of diagnosis, planning of further management, and coordination of care.  MEDICATIONS: Scheduled Meds: . ALPRAZolam  0.5 mg Oral QHS  . amiodarone  200 mg Oral BID  . Chlorhexidine Gluconate Cloth  6 each Topical Daily  . darbepoetin (ARANESP) injection - NON-DIALYSIS  100 mcg Subcutaneous Q Sun-1800  . feeding supplement (GLUCERNA SHAKE)  237 mL Oral BID BM  . fluticasone  1 spray Each Nare Daily  . insulin aspart  0-5 Units Subcutaneous QHS  . insulin aspart  0-9 Units Subcutaneous TID WC  . insulin glargine  15 Units Subcutaneous QHS  . isosorbide mononitrate  30 mg Oral Daily  . latanoprost  1 drop Both Eyes QHS  . metoprolol succinate  25 mg Oral Q breakfast  . pantoprazole  40 mg Oral Daily  .  polyethylene glycol  17 g Oral BID  . rosuvastatin  10 mg Oral QHS  . sodium chloride flush  10-40 mL Intracatheter Q12H  . sodium  chloride flush  3 mL Intravenous Q12H  . Warfarin - Pharmacist Dosing Inpatient   Does not apply q1800   Continuous Infusions: . sodium chloride    . ferumoxytol Stopped (12/06/16 1900)  . furosemide 120 mg (12/09/16 0526)   PRN Meds:.sodium chloride, acetaminophen, albuterol, guaiFENesin-dextromethorphan, ondansetron (ZOFRAN) IV, sodium chloride flush, sodium chloride flush   PHYSICAL EXAM: Vital signs: Vitals:   12/08/16 2000 12/08/16 2259 12/09/16 0340 12/09/16 0746  BP: 132/65  140/67 135/66  Pulse:    80  Resp: 12  (!) 26 (!) 21  Temp:  98.6 F (37 C) 97.7 F (36.5 C) 97.9 F (36.6 C)  TempSrc:  Oral Axillary Oral  SpO2: 100%  98% 98%  Weight:      Height:       Filed Weights   12/06/16 0500 12/07/16 0404 12/07/16 0451  Weight: 95 kg (209 lb 7 oz) 95.8 kg (211 lb 3.2 oz) 94.2 kg (207 lb 10.8 oz)   Body mass index is 31.58 kg/m.   General appearance :Awake, alert, not in any distress.  Eyes:, pupils equally reactive to light and accomodation,no scleral icterus. HEENT: Atraumatic and Normocephalic Neck: supple, no JVD. Resp:Good air entry bilaterally CVS: S1 S2 regular  GI: Bowel sounds present, Non tender and not distended with no gaurding, rigidity or rebound. Extremities: B/L Lower Ext shows ++edema, both legs are warm to touch Neurology:  speech clear,Non focal, sensation is grossly intact. Psychiatric: Normal judgment and insight. Normal mood. Musculoskeletal:No digital cyanosis Skin:No Rash, warm and dry Wounds:N/A  I have personally reviewed following labs and imaging studies  LABORATORY DATA: CBC:  Recent Labs Lab 12/04/16 0003 12/05/16 0533 12/06/16 0335 12/07/16 0500 12/09/16 0519  WBC 7.0 5.0 5.7 5.6 6.6  NEUTROABS  --  3.7 4.4 4.2  --   HGB 8.2* 7.7* 7.8* 8.1* 7.9*  HCT 26.2* 24.5* 25.5* 24.8* 26.4*  MCV 92.9 92.8 93.1 92.2 95.0  PLT 269 283 294 294 854    Basic Metabolic Panel:  Recent Labs Lab 12/05/16 0533 12/06/16 0335  12/07/16 0500 12/08/16 0608 12/09/16 0508  NA 131* 130* 134* 136 137  K 4.4 4.1 3.8 3.2* 3.7  CL 93* 92* 94* 94* 94*  CO2 29 27 28 30 31   GLUCOSE 172* 184* 124* 171* 146*  BUN 94* 96* 88* 77* 66*  CREATININE 4.75* 4.65* 4.21* 3.79* 3.51*  CALCIUM 8.4* 8.4* 8.5* 8.5* 8.8*  MG 2.7* 2.7* 2.6*  --   --   PHOS  --  5.7* 5.3* 4.5 3.9    GFR: Estimated Creatinine Clearance: 17.4 mL/min (A) (by C-G formula based on SCr of 3.51 mg/dL (H)).  Liver Function Tests:  Recent Labs Lab 12/04/16 1011 12/06/16 0335 12/07/16 0500 12/08/16 0608 12/09/16 0508  AST 51*  --   --   --   --   ALT 40  --   --   --   --   ALKPHOS 125  --   --   --   --   BILITOT 0.7  --   --   --   --   PROT 6.6  --   --   --   --   ALBUMIN 2.5* 2.3* 2.2* 2.3* 2.4*   No results for input(s): LIPASE, AMYLASE in the last 168 hours. No results  for input(s): AMMONIA in the last 168 hours.  Coagulation Profile:  Recent Labs Lab 12/05/16 0533 12/06/16 0335 12/07/16 0500 12/08/16 0608 12/09/16 0519  INR 4.51* 4.24* 3.48 2.69 2.92    Cardiac Enzymes:  Recent Labs Lab 12/04/16 0004 12/04/16 1011  TROPONINI 7.82* 5.78*    BNP (last 3 results) No results for input(s): PROBNP in the last 8760 hours.  HbA1C: No results for input(s): HGBA1C in the last 72 hours.  CBG:  Recent Labs Lab 12/07/16 2108 12/08/16 0844 12/08/16 1228 12/08/16 1718 12/08/16 2125  GLUCAP 302* 162* 193* 297* 255*    Lipid Profile: No results for input(s): CHOL, HDL, LDLCALC, TRIG, CHOLHDL, LDLDIRECT in the last 72 hours.  Thyroid Function Tests: No results for input(s): TSH, T4TOTAL, FREET4, T3FREE, THYROIDAB in the last 72 hours.  Anemia Panel: No results for input(s): VITAMINB12, FOLATE, FERRITIN, TIBC, IRON, RETICCTPCT in the last 72 hours.  Urine analysis:    Component Value Date/Time   COLORURINE YELLOW 12/04/2016 1425   APPEARANCEUR TURBID (A) 12/04/2016 1425   LABSPEC 1.013 12/04/2016 1425   PHURINE 5.0  12/04/2016 1425   GLUCOSEU NEGATIVE 12/04/2016 1425   HGBUR LARGE (A) 12/04/2016 1425   BILIRUBINUR NEGATIVE 12/04/2016 1425   KETONESUR NEGATIVE 12/04/2016 1425   PROTEINUR 100 (A) 12/04/2016 1425   UROBILINOGEN 0.2 08/23/2007 2037   NITRITE NEGATIVE 12/04/2016 1425   LEUKOCYTESUR LARGE (A) 12/04/2016 1425    Sepsis Labs: Lactic Acid, Venous    Component Value Date/Time   LATICACIDVEN 0.65 10/31/2016 0838    MICROBIOLOGY: Recent Results (from the past 240 hour(s))  MRSA PCR Screening     Status: None   Collection Time: 12/03/16 10:41 AM  Result Value Ref Range Status   MRSA by PCR NEGATIVE NEGATIVE Final    Comment:        The GeneXpert MRSA Assay (FDA approved for NASAL specimens only), is one component of a comprehensive MRSA colonization surveillance program. It is not intended to diagnose MRSA infection nor to guide or monitor treatment for MRSA infections.     RADIOLOGY STUDIES/RESULTS: Dg Chest 1 View  Result Date: 11/21/2016 CLINICAL DATA:  Pleural effusions, right greater than left. EXAM: CHEST 1 VIEW COMPARISON:  11/20/2016 FINDINGS: Right pleural effusion has been eliminated. There is a moderate left effusion which appears slightly more prominent than on the prior study. Overall heart size and pulmonary vascularity are normal. No pneumothorax after thoracentesis. IMPRESSION: No pneumothorax after right thoracentesis. No visible residual right pleural fluid. Moderate left effusion appears slightly increased. Electronically Signed   By: Lorriane Shire M.D.   On: 11/21/2016 13:08   Dg Chest 2 View  Result Date: 12/07/2016 CLINICAL DATA:  71 year old female with shortness of breath. Admitted with acute on chronic heart failure. EXAM: CHEST  2 VIEW COMPARISON:  12/05/2016 and earlier. FINDINGS: AP and lateral views of the chest. Continued moderate-sized veiling opacity at both lung bases and fluid tracking in the pleural fissures, including the right minor fissure.  Stable pulmonary vascularity, no overt edema. No pneumothorax. Stable cardiac size and mediastinal contours. No acute osseous abnormality identified. Paucity of bowel gas in the upper abdomen. IMPRESSION: 1. Continued moderate bilateral pleural effusions with bibasilar collapse or consolidation. 2. Stable pulmonary vascularity without overt edema. Electronically Signed   By: Genevie Ann M.D.   On: 12/07/2016 08:19   Dg Chest 2 View  Result Date: 12/05/2016 CLINICAL DATA:  71 year old female with history of dyspnea. EXAM: CHEST  2 VIEW COMPARISON:  Chest x-ray 12/03/2016. FINDINGS: Persistent moderate right and small left pleural effusions. Bibasilar opacities (right greater than left), suggest superimposed areas of subsegmental atelectasis, although underlying airspace consolidation is not entirely excluded. No evidence of pulmonary edema. Heart size is normal. Upper mediastinal contours are within normal limits. IMPRESSION: 1. Persistent moderate right and small left pleural effusions with associated areas of probable passive subsegmental atelectasis throughout the lung bases bilaterally. Electronically Signed   By: Vinnie Langton M.D.   On: 12/05/2016 07:53   Dg Chest 2 View  Result Date: 11/19/2016 CLINICAL DATA:  Short of breath. Discharged 3 days ago with congestive heart failure. EXAM: CHEST  2 VIEW COMPARISON:  11/10/2016 FINDINGS: Degraded lateral view secondary to positioning and overlying artifact. Midline trachea. Normal heart size for level of inspiration. Increase in moderate right pleural effusion. No pneumothorax. Low lung volumes. Mild pulmonary venous congestion. Worsened right and developing mild left base airspace disease. IMPRESSION: Increase in moderate right pleural effusion with adjacent atelectasis or infection. Mild pulmonary venous congestion with minimal left base atelectasis. Electronically Signed   By: Abigail Miyamoto M.D.   On: 11/19/2016 09:08   Ct Chest Wo Contrast  Result  Date: 11/19/2016 CLINICAL DATA:  Shortness of breath. EXAM: CT CHEST WITHOUT CONTRAST TECHNIQUE: Multidetector CT imaging of the chest was performed following the standard protocol without IV contrast. COMPARISON:  Chest x-ray from same day. CT chest dated October 31, 2016. FINDINGS: Cardiovascular: Mild cardiomegaly, unchanged. New small pericardial effusion. Coronary, aortic arch, and branch vessel atherosclerotic vascular disease. Mediastinum/Nodes: Prominent and mildly enlarged mediastinal lymph nodes are stable to slightly decreased in size. No axillary lymphadenopathy. The thyroid gland, trachea, and esophagus are unremarkable. Lungs/Pleura: Large right and small left pleural effusions. Right greater than left bibasilar atelectasis. Additional atelectasis in the right middle lobe. No suspicious pulmonary nodules. No consolidation or pneumothorax. Upper Abdomen: No acute abnormality. Musculoskeletal: No chest wall mass or suspicious bone lesions identified. No fracture. Unchanged degenerative changes of the thoracic spine, worst at T7-T8 and T10-T11. IMPRESSION: 1. Large right and small left pleural effusions with adjacent right greater than left atelectasis. 2. New small pericardial effusion. 3. Stable to slightly decreased in size mildly enlarged mediastinal lymph nodes, likely reactive. 4.  Aortic atherosclerosis (ICD10-I70.0). Electronically Signed   By: Titus Dubin M.D.   On: 11/19/2016 11:16   Dg Chest Right Decubitus  Result Date: 11/19/2016 CLINICAL DATA:  Right pleural effusion. EXAM: CHEST - RIGHT DECUBITUS COMPARISON:  Chest x-ray from earlier today and CT of the chest November 19, 2016 FINDINGS: The right-side-down decubitus film demonstrates a layering right-sided effusion which correlates with comparison imaging. IMPRESSION: The right-sided pleural effusion layers on this study. Electronically Signed   By: Dorise Bullion III M.D   On: 11/19/2016 15:29   Nm Myocar Multi W/spect Tamela Oddi  Motion / Ef  Result Date: 11/15/2016  There was no ST segment deviation noted during stress. T wave inversions inferiorly.  Defect 1: There is a large defect of severe severity present in the basal inferolateral, basal anterolateral, mid anterior, mid anteroseptal, mid inferolateral, mid anterolateral, apical anterior, apical inferior, apical lateral and apex location.  This is a high risk study.  Findings consistent with prior myocardial infarction. No ischemic territories.  Nuclear stress EF: 21%.    Dg Chest Port 1 View  Result Date: 12/03/2016 CLINICAL DATA:  Shortness of breath. EXAM: PORTABLE CHEST 1 VIEW COMPARISON:  12/01/2016 FINDINGS: Bilateral lower lung opacities and bilateral pleural effusions again noted.  Visualized cardiomediastinal silhouette is unchanged. No pneumothorax. There has been little interval change since the prior study. IMPRESSION: Unchanged appearance of the chest with bilateral lower lung opacities/ atelectasis and bilateral pleural effusions. Electronically Signed   By: Margarette Canada M.D.   On: 12/03/2016 15:09   Dg Chest Port 1 View  Result Date: 11/20/2016 CLINICAL DATA:  Shortness of breath EXAM: PORTABLE CHEST 1 VIEW COMPARISON:  11/19/2016 FINDINGS: Large right and small to moderate left pleural effusions causing hazy opacities obscuring the lung bases. Mild enlargement of the cardiopericardial silhouette. No findings of acute pulmonary edema. The right pleural effusion has a greater layering effect than on 11/19/2016, possibly due to lack of complete settling prior to imaging. IMPRESSION: 1. Similar appearance of large right and small to moderate left pleural effusions with passive atelectasis. 2. Mild enlargement of the cardiopericardial silhouette. Electronically Signed   By: Van Clines M.D.   On: 11/20/2016 12:09   Dg Chest Port 1 View  Result Date: 11/10/2016 CLINICAL DATA:  Shortness of breath tonight EXAM: PORTABLE CHEST 1 VIEW COMPARISON:  October 31, 2016 FINDINGS: The mediastinal contour is normal. The heart size is mildly enlarged. There is a small right pleural effusion with patchy consolidation of right lung base. The left lung is clear. No acute abnormalities identified in the visualized bones. IMPRESSION: Small right pleural effusion with patchy consolidation of right lung base, underline pneumonia is not excluded. Electronically Signed   By: Abelardo Diesel M.D.   On: 11/10/2016 21:28   US Thoracentesis Asp Pleural Space W/img Guide  Result Date: 11/21/2016 INDICATION: Right pleural effusion. EXAM: ULTRASOUND GUIDED RIGHT THORACENTESIS MEDICATIONS: None. COMPLICATIONS: None immediate. PROCEDURE: An ultrasound guided thoracentesis was thoroughly discussed with the patient and questions answered. The benefits, risks, alternatives and complications were also discussed. The patient understands and wishes to proceed with the procedure. Written consent was obtained. Ultrasound was performed to localize and mark an adequate pocket of fluid in the RIGHT chest. The area was then prepped and draped in the normal sterile fashion. 1% Lidocaine was used for local anesthesia. Under ultrasound guidance a Yueh catheter was introduced. Thoracentesis was performed. The catheter was removed and a dressing applied. FINDINGS: A total of approximately 1.7 L of dark blood-tinged fluid was removed. Samples were sent to the laboratory as requested by the clinical team. IMPRESSION: Successful ultrasound guided right thoracentesis yielding 1.7 L of pleural fluid. Electronically Signed   By: Lorriane Shire M.D.   On: 11/21/2016 13:09     LOS: 6 days   Oren Binet, MD  [facility] Pager:[phone]  If 7PM-7AM, please contact night-coverage [email or web address] Password [email or web address] 12/09/2016, 9:40 AM

## 2016-12-09 NOTE — Discharge Instructions (Signed)

## 2016-12-09 NOTE — Progress Notes (Signed)
[facility] This note is requested by patient to be FAXED to her work, to attest to the fact that that patient is still hospitalized at [place], MD [place] [phone] Pager 12/09/2016, 3:47 PM

## 2016-12-09 NOTE — Progress Notes (Signed)
[facility] Rounding Note  Subjective/Interval History:  Continued good UOP and slow renal recovery  Objective Vital signs in last 24 hours: Vitals:   12/08/16 2259 12/09/16 0340 12/09/16 0746 12/09/16 1203  BP:  140/67 135/66 (!) 150/71  Pulse:   80 66  Resp:  (!) 26 (!) 21 (!) 24  Temp: 98.6 F (37 C) 97.7 F (36.5 C) 97.9 F (36.6 C) 97.9 F (36.6 C)  TempSrc: Oral Axillary Oral Oral  SpO2:  98% 98% 99%  Weight:      Height:        I/O last 3 completed shifts: In: 600 [P.O.:476; IV Piggyback:124] Out: 5885 [Urine:3425] No intake/output data recorded.   Physical Exam:  Blood pressure (!) 150/71, pulse 66, temperature 97.9 F (36.6 C), temperature source Oral, resp. rate (!) 24, height 5\' 8"  (1.727 m), weight 94.2 kg (207 lb 10.8 oz), SpO2 99 %.  Awake, alert. Weight 207 ("good outpt dry wt has been 193-197 previously) - not weighed since 10/3 Upbeat, pleasant VS as noted Lungs grossly clear S1S2 No S3 Sinus brady Abd soft and not tender 1+ pitting edema both LE's - improved some more (when euvolemic as outpt NO edema at all so a ways to go) No asterixus  Recent Labs Lab 12/04/16 0003 12/04/16 1011 12/05/16 0533 12/06/16 0335 12/07/16 0500 12/08/16 0608 12/09/16 0508  NA 128* 128* 131* 130* 134* 136 137  K 4.3 4.4 4.4 4.1 3.8 3.2* 3.7  CL 92* 92* 93* 92* 94* 94* 94*  CO2 24 23 29 27 28 30 31   GLUCOSE 301* 289* 172* 184* 124* 171* 146*  BUN 83* 87* 94* 96* 88* 77* 66*  CREATININE 4.28* 4.39* 4.75* 4.65* 4.21* 3.79* 3.51*  CALCIUM 8.4* 8.3* 8.4* 8.4* 8.5* 8.5* 8.8*  PHOS  --   --   --  5.7* 5.3* 4.5 3.9    Recent Labs Lab 12/04/16 1011  12/07/16 0500 12/08/16 0608 12/09/16 0508  AST 51*  --   --   --   --   ALT 40  --   --   --   --   ALKPHOS 125  --   --   --   --   BILITOT 0.7  --   --   --   --   PROT 6.6  --   --   --   --   ALBUMIN 2.5*  < > 2.2* 2.3* 2.4*  < > = values in this interval not displayed.   Recent Labs Lab 12/05/16 0533  12/06/16 0335 12/07/16 0500 12/09/16 0519  WBC 5.0 5.7 5.6 6.6  NEUTROABS 3.7 4.4 4.2  --   HGB 7.7* 7.8* 8.1* 7.9*  HCT 24.5* 25.5* 24.8* 26.4*  MCV 92.8 93.1 92.2 95.0  PLT 283 294 294 302    Recent Labs Lab 12/04/16 0004 12/04/16 1011  TROPONINI 7.82* 5.78*   Lab Results  Component Value Date   INR 2.92 12/09/2016   INR 2.69 12/08/2016   INR 3.48 12/07/2016     Recent Labs Lab 12/08/16 1228 12/08/16 1718 12/08/16 2125 12/09/16 0744 12/09/16 1156  GLUCAP 193* 297* 255* 149* 178*   Iron/TIBC/Ferritin/ %Sat    Component Value Date/Time   IRON 23 (L) 12/06/2016 0335   TIBC 276 12/06/2016 0335   IRONPCTSAT 8 (L) 12/06/2016 0335   Medications: . sodium chloride    . ferumoxytol Stopped (12/06/16 1900)  . furosemide 120 mg (12/09/16 0526)   . ALPRAZolam  0.5 mg  Oral QHS  . amiodarone  200 mg Oral BID  . Chlorhexidine Gluconate Cloth  6 each Topical Daily  . darbepoetin (ARANESP) injection - NON-DIALYSIS  100 mcg Subcutaneous Q Sun-1800  . feeding supplement (GLUCERNA SHAKE)  237 mL Oral BID BM  . fluticasone  1 spray Each Nare Daily  . insulin aspart  0-5 Units Subcutaneous QHS  . insulin aspart  0-9 Units Subcutaneous TID WC  . insulin glargine  18 Units Subcutaneous QHS  . isosorbide mononitrate  30 mg Oral Daily  . latanoprost  1 drop Both Eyes QHS  . metoprolol succinate  25 mg Oral Q breakfast  . pantoprazole  40 mg Oral Daily  . polyethylene glycol  17 g Oral BID  . rosuvastatin  10 mg Oral QHS  . sodium chloride flush  10-40 mL Intracatheter Q12H  . sodium chloride flush  3 mL Intravenous Q12H  . Warfarin - Pharmacist Dosing Inpatient   Does not apply q1800    Background: 71 y.o. 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 Lorrene Reid - BL creatinine mid 2's) admitted with SOB and fluid overload initially to [facility]. AKI hemodynamically driven (bradycardia/low BP). Creatinine peaked at 4.75  Assessment/Recommendations  1. 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 DM, HTN, OAB issues.  1. BL creatinine usual mid to low 2's (2.0  as recently as 11/27/16; I follow at [place])  2. AKI on CKD in setting of AFib, large doses toprol, low HR and BP. I believe mostly hemodynamically driven.  3. Toprol dose reduced to 25.  4. Diuresing with IV lasix 120 Q12H. Still 10+ lb over usual euvolemic outpt weight as of 10/3 - need updated weight 5. Most recent outpt dose of torsemide was 80 BID which worked when creatinine in the 2's, was not sure would be effective now. So started with IV lasix and UOP has picked up nicely - will continue through tomorrow, then probably change to po torsemide.   2. Anemia  - Prior good outpt Hb's. Has required Feraheme in the past, never Aranesp. Started that here. Darbepoetin 100 mcg/week (1st dosed 9/30). ^ to 200 for next dose.Fe quite low - Feraheme dosed 10/2. Would personally have low threshold to transfuse given her CAD.  3. CAD - + trops falling. Several prior interventions. No plans for cath so far this trip. Risk outweighs benefit per cards.  4. DM - per primary service 5. Supratherapeutic INR - pharmacy monitoring. 6. Hypokalemia - KDur 30 BID X 2 doses yesterday, fine today  Jamal Maes, MD [facility] [phone] pager 12/09/2016, 3:50 PM

## 2016-12-09 NOTE — Progress Notes (Signed)
Physical Therapy Treatment Patient Details Name: [name] MRN: [id number] DOB: [birth year] Today's Date: 12/09/2016    History of Present Illness Patient is a 71 y/o female who presents with worsening SOB. Admitted with acute on chronic heart failure and acute on chronic kidney disease. CXR-Persistent moderate right and small left pleural effusions. Recent hospital admission at [facility]. 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    PT Comments    Pt making steady progress. Per Social work note pt refusing SNF and plans to return home.   Follow Up Recommendations  SNF(pt refusing so would maximize [facility] services).     Equipment Recommendations  None recommended by PT    Recommendations for Other Services       Precautions / Restrictions Precautions Precautions: Fall Restrictions Weight Bearing Restrictions: No    Mobility  Bed Mobility Overal bed mobility: Needs Assistance Bed Mobility: Rolling;Sidelying to Sit Rolling: Min guard Sidelying to sit: Min guard       General bed mobility comments: Assist for safety  Transfers Overall transfer level: Needs assistance Equipment used: Rolling walker (2 wheeled) Transfers: Sit to/from Stand Sit to Stand: Min assist         General transfer comment: Assist to bring hipe up and for balance  Ambulation/Gait Ambulation/Gait assistance: Min assist;+2 safety/equipment Ambulation Distance (Feet): 60 Feet Assistive device: Rolling walker (2 wheeled) Gait Pattern/deviations: Step-through pattern;Decreased step length - right;Decreased step length - left;Shuffle;Trunk flexed Gait velocity: decr Gait velocity interpretation: Below normal speed for age/gender General Gait Details: Assist for balance and support. Verbal cues to stand more erect   Stairs            Wheelchair Mobility    Modified Rankin (Stroke  Patients Only)       Balance Overall balance assessment: Needs assistance Sitting-balance support: No upper extremity supported;Feet supported Sitting balance-[name]: Fair     Standing balance support: Bilateral upper extremity supported Standing balance-[name]: Poor Standing balance comment: walker and min assist for static standing                            Cognition Arousal/Alertness: Awake/alert Behavior During Therapy: Flat affect Overall Cognitive Status: Within Functional Limits for tasks assessed                                        Exercises      General Comments        Pertinent Vitals/Pain Pain Assessment: No/denies pain    Home Living                      Prior Function            PT Goals (current goals can now be found in the care plan section) Progress towards PT goals: Progressing toward goals    Frequency    Min 3X/week      PT Plan Current plan remains appropriate    Co-evaluation              AM-PAC PT "6 Clicks" Daily Activity  Outcome Measure  Difficulty turning over in bed (including adjusting bedclothes, sheets and blankets)?: A Little Difficulty moving from lying on back to sitting on the side of the bed? : Unable Difficulty  sitting down on and standing up from a chair with arms (e.g., wheelchair, bedside commode, etc,.)?: Unable Help needed moving to and from a bed to chair (including a wheelchair)?: A Little Help needed walking in hospital room?: A Little Help needed climbing 3-5 steps with a railing? : Total 6 Click Score: 12    End of Session Equipment Utilized During Treatment: Gait belt Activity Tolerance: Patient limited by fatigue Patient left: in chair;with call bell/phone within reach;with chair alarm set Nurse Communication: Mobility status PT Visit Diagnosis: Unsteadiness on feet (R26.81);Other abnormalities of gait and mobility (R26.89);Muscle weakness  (generalized) (M62.81)     Time: 5015-8682 PT Time Calculation (min) (ACUTE ONLY): 26 min  Charges:  $Gait Training: 23-37 mins                    G Codes:       [facility] PT [place] 12/09/2016, 2:53 PM

## 2016-12-09 NOTE — Progress Notes (Signed)
[place] for warfarin Indication: atrial fibrillation  Allergies  Allergen Reactions  . Ace Inhibitors Cough  . Amlodipine Other (See Comments)    Edema   . Codeine Rash    Patient Measurements: Height: 5\' 8"  (172.7 cm) Weight: 207 lb 10.8 oz (94.2 kg) IBW/kg (Calculated) : 63.9  Assessment: 71 YOF on Coumadin alternating 7.5mg  and 3.75mg  every other day PTA for Afib per records from [place] = 5. Has been on clindamycin and trimethoprim recently. Also on amiodarone - new start this admit (had RX but not started PTA). INR was elevated on admit and had been trending down, restarted on 10/4 and now INR back up to 2.92. Amio interaction, CHF hepatic dysfunction. Hgb low but stable at 7.9, plts wnl.  Goal of Therapy:  INR 2-3 Monitor platelets by anticoagulation protocol: Yes   Plan:  Hold Coumadin today Monitor daily INR, CBC, s/s of bleed Will probably need lower dose than PTA dose  Elenor Quinones, PharmD, [facility] Clinical Pharmacist Pager [phone] 12/09/2016 9:26 AM

## 2016-12-09 NOTE — Progress Notes (Signed)
CSW spoke with patient concerning current bed [facility]- [facility]  CSW discussed other facility options in [place], [place], and [place] area and patient not interested in any of them at this time- states she would rather go home with family.  Pt states she lives with a dtr and that her spouse would be coming home soon as well from SNF- state she has several other children who will be able to assist  CSW updated RNCM that patient now stating she wants to go home- CSW left bed offers at bedside so she could discuss with family if they change their mind  CSW available if plan changes but signing off for now  Jorge Ny, [place] Worker [phone]

## 2016-12-09 NOTE — Progress Notes (Signed)
Inpatient Diabetes Program Recommendations  AACE/ADA: New Consensus Statement on Inpatient Glycemic Control (2015)  Target Ranges:  Prepandial:   less than 140 mg/dL      Peak postprandial:   less than 180 mg/dL (1-2 hours)      Critically ill patients:  140 - 180 mg/dL   Results for [name], [name] (MRN [id number]) as of 12/09/2016 09:54  Ref. Range 12/08/2016 08:44 12/08/2016 12:28 12/08/2016 17:18 12/08/2016 21:25  Glucose-Capillary Latest Ref Range: 65 - 99 mg/dL 162 (H) 193 (H) 297 (H) 255 (H)    Home DM Meds: Lantus 30 units QHS       Humalog 15-21 units TID  Current Insulin Orders: Lantus 18 units QHS      Novolog Sensitive Correction Scale/ SSI (0-9 units) TID AC + HS      MD- Note Lantus increased to 18 units QHS today.  Will get increased dose tonight.  Note that postprandial CBGs were elevated yesterday as well.  Please consider starting low dose Novolog Meal Coverage: Novolog 4 units TID with meals (hold if pt eats <50% of meal)      --Will follow patient during hospitalization--  Wyn Quaker RN, MSN, CDE Diabetes Coordinator Inpatient Glycemic Control Team Team Pager: [phone] (8a-5p)

## 2016-12-09 NOTE — Progress Notes (Signed)
Progress Note  Patient Name: [name] Date of Encounter: 12/09/2016  Primary Cardiologist: Bronson Ing  Subjective   Continues to have good UO, net diuresis, slowly improving renal function.  Inpatient Medications    Scheduled Meds: . ALPRAZolam  0.5 mg Oral QHS  . amiodarone  200 mg Oral BID  . Chlorhexidine Gluconate Cloth  6 each Topical Daily  . darbepoetin (ARANESP) injection - NON-DIALYSIS  100 mcg Subcutaneous Q Sun-1800  . feeding supplement (GLUCERNA SHAKE)  237 mL Oral BID BM  . fluticasone  1 spray Each Nare Daily  . insulin aspart  0-5 Units Subcutaneous QHS  . insulin aspart  0-9 Units Subcutaneous TID WC  . insulin glargine  18 Units Subcutaneous QHS  . isosorbide mononitrate  30 mg Oral Daily  . latanoprost  1 drop Both Eyes QHS  . metoprolol succinate  25 mg Oral Q breakfast  . pantoprazole  40 mg Oral Daily  . polyethylene glycol  17 g Oral BID  . rosuvastatin  10 mg Oral QHS  . sodium chloride flush  10-40 mL Intracatheter Q12H  . sodium chloride flush  3 mL Intravenous Q12H  . Warfarin - Pharmacist Dosing Inpatient   Does not apply q1800   Continuous Infusions: . sodium chloride    . ferumoxytol Stopped (12/06/16 1900)  . furosemide 120 mg (12/09/16 0526)   PRN Meds: sodium chloride, acetaminophen, albuterol, guaiFENesin-dextromethorphan, ondansetron (ZOFRAN) IV, sodium chloride flush, sodium chloride flush   Vital Signs    Vitals:   12/08/16 2000 12/08/16 2259 12/09/16 0340 12/09/16 0746  BP: 132/65  140/67 135/66  Pulse:    80  Resp: 12  (!) 26 (!) 21  Temp:  98.6 F (37 C) 97.7 F (36.5 C) 97.9 F (36.6 C)  TempSrc:  Oral Axillary Oral  SpO2: 100%  98% 98%  Weight:      Height:        Intake/Output Summary (Last 24 hours) at 12/09/16 1013 Last data filed at 12/09/16 0340  Gross per 24 hour  Intake              600 ml  Output             2125 ml  Net            -1525 ml   Filed Weights   12/06/16 0500 12/07/16 0404  12/07/16 0451  Weight: 209 lb 7 oz (95 kg) 211 lb 3.2 oz (95.8 kg) 207 lb 10.8 oz (94.2 kg)    Telemetry    NSR - Personally Reviewed  ECG    No new tracing - Personally Reviewed  Physical Exam  Smiling, clearly feels better GEN: No acute distress.   Neck: 5 cm JVD Cardiac: RRR, no murmurs, rubs, or gallops.  Respiratory: Clear to auscultation bilaterally. GI: Soft, nontender, non-distended  MS: 1+ edema; No deformity. Neuro:  Nonfocal  Psych: Normal affect   Labs    Chemistry Recent Labs Lab 12/04/16 1011  12/07/16 0500 12/08/16 0608 12/09/16 0508  NA 128*  < > 134* 136 137  K 4.4  < > 3.8 3.2* 3.7  CL 92*  < > 94* 94* 94*  CO2 23  < > 28 30 31   GLUCOSE 289*  < > 124* 171* 146*  BUN 87*  < > 88* 77* 66*  CREATININE 4.39*  < > 4.21* 3.79* 3.51*  CALCIUM 8.3*  < > 8.5* 8.5* 8.8*  PROT 6.6  --   --   --   --  ALBUMIN 2.5*  < > 2.2* 2.3* 2.4*  AST 51*  --   --   --   --   ALT 40  --   --   --   --   ALKPHOS 125  --   --   --   --   BILITOT 0.7  --   --   --   --   GFRNONAA 9*  < > 10* 11* 12*  GFRAA 11*  < > 11* 13* 14*  ANIONGAP 13  < > 12 12 12   < > = values in this interval not displayed.   Hematology Recent Labs Lab 12/06/16 0335 12/07/16 0500 12/09/16 0519  WBC 5.7 5.6 6.6  RBC 2.74* 2.69* 2.78*  HGB 7.8* 8.1* 7.9*  HCT 25.5* 24.8* 26.4*  MCV 93.1 92.2 95.0  MCH 28.5 30.1 28.4  MCHC 30.6 32.7 29.9*  RDW 17.5* 17.8* 18.0*  PLT 294 294 302    Cardiac Enzymes Recent Labs Lab 12/04/16 0004 12/04/16 1011  TROPONINI 7.82* 5.78*   No results for input(s): TROPIPOC in the last 168 hours.   BNP Recent Labs Lab 12/04/16 1820  BNP 2,222.9*     DDimer No results for input(s): DDIMER in the last 168 hours.   Radiology    No results found.  Cardiac Studies   11/2016 nuclear stress: large inferolateralscar, LVEF 22%.  11/2015 cath: patent RCA stents, mod LAD disease, LCX CTO. Recs for medical thearpy.  11/2016 LVEF 30-35%, grade II  diastolic dysfunciton  Patient Profile     71 y.o. female with CHF due to CAD (NSTEMI 06/2015 with DES to PDA and RCA at that time, subtotal occlusion of LCX treated medically; repeat cath 9/2017with stable disease; inferolateral scar w/o ischemia),LVEF 30-35%, grade II diastolic dysfunction, paroxysmal atrial fibrillation, right subclavian stenosis s/p intervention, DM2, HTN, HL, CKD IV admitted with SOB and fluid overload (initially to [facility]).   Assessment & Plan     1. CHF: Excellent progress last few days. Remains hypervolemic, probably at least 10 lb above "dry weight" (195-197 lb in my estimation by review of records). OK for transfer to telemetry. Diuretics per Dr. Lorrene Reid. 2. CAD: angina free, no evidence of meaningful areas of reversible ischemia on recent nuclear stress test. No plan for invasive evaluation despite recent small non-STEMI. 3. PAFib: controlled on amiodarone. Reduce . Plan to reduce the dose of amiodarone to maintenance 200 mg daily next week. 4. Warfarin: INR therapeutic level 5. Ac on chr renal failure: acute decompensation on background of diabetic-hypertensive nephropathy with baseline creatinine in the low to mid 2 range. Improving despite net diuresis.   For questions or updates, please contact [facility] Please consult [email or web address] for contact info under Cardiology/STEMI.      Signed, Sanda Klein, MD  12/09/2016, 10:13 AM

## 2016-12-10 LAB — RENAL FUNCTION PANEL
ALBUMIN: 2.4 g/dL — AB (ref 3.5–5.0)
Anion gap: 11 (ref 5–15)
BUN: 60 mg/dL — AB (ref 6–20)
CO2: 33 mmol/L — AB (ref 22–32)
Calcium: 8.8 mg/dL — ABNORMAL LOW (ref 8.9–10.3)
Chloride: 93 mmol/L — ABNORMAL LOW (ref 101–111)
Creatinine, Ser: 3.25 mg/dL — ABNORMAL HIGH (ref 0.44–1.00)
GFR calc Af Amer: 15 mL/min — ABNORMAL LOW (ref >60)
GFR calc non Af Amer: 13 mL/min — ABNORMAL LOW (ref >60)
GLUCOSE: 241 mg/dL — AB (ref 65–99)
PHOSPHORUS: 3.8 mg/dL (ref 2.5–4.6)
POTASSIUM: 3.8 mmol/L (ref 3.5–5.1)
SODIUM: 137 mmol/L (ref 135–145)

## 2016-12-10 LAB — GLUCOSE, CAPILLARY
GLUCOSE-CAPILLARY: 199 mg/dL — AB (ref 65–99)
GLUCOSE-CAPILLARY: 186 mg/dL — AB (ref 65–99)
Glucose-Capillary: 211 mg/dL — ABNORMAL HIGH (ref 65–99)
Glucose-Capillary: 211 mg/dL — ABNORMAL HIGH (ref 65–99)

## 2016-12-10 LAB — PROTIME-INR
INR: 2.92
PROTHROMBIN TIME: 30.2 s — AB (ref 11.4–15.2)

## 2016-12-10 MED ORDER — TORSEMIDE 20 MG PO TABS
80.0000 mg | ORAL_TABLET | Freq: Two times a day (BID) | ORAL | Status: DC
  Administered 2016-12-10 – 2016-12-12 (×4): 80 mg via ORAL
  Filled 2016-12-10 (×4): qty 4

## 2016-12-10 MED ORDER — CLOPIDOGREL BISULFATE 75 MG PO TABS
75.0000 mg | ORAL_TABLET | Freq: Every day | ORAL | Status: DC
  Administered 2016-12-10 – 2016-12-12 (×3): 75 mg via ORAL
  Filled 2016-12-10 (×3): qty 1

## 2016-12-10 MED ORDER — WARFARIN SODIUM 2.5 MG PO TABS
2.5000 mg | ORAL_TABLET | Freq: Once | ORAL | Status: AC
  Administered 2016-12-10: 2.5 mg via ORAL
  Filled 2016-12-10: qty 1

## 2016-12-10 NOTE — Plan of Care (Signed)
Problem: Tissue Perfusion: Goal: Risk factors for ineffective tissue perfusion will decrease Outcome: Progressing Maintains oxygen saturation in the high 90's on room air   Problem: Activity: Goal: Risk for activity intolerance will decrease Outcome: Progressing Up to chair with walker and one standby assist   Problem: Fluid Volume: Goal: Ability to maintain a balanced intake and output will improve Outcome: Progressing Continues to diurese

## 2016-12-10 NOTE — Progress Notes (Signed)
[place] for warfarin Indication: atrial fibrillation  Allergies  Allergen Reactions  . Ace Inhibitors Cough  . Amlodipine Other (See Comments)    Edema   . Codeine Rash    Patient Measurements: Height: 5\' 8"  (172.7 cm) Weight: 195 lb 1.6 oz (88.5 kg) IBW/kg (Calculated) : 63.9  Assessment: 71 YOF on Coumadin PTA for Afib per records from [place] = 5. She is noted on amiodarone - new start this admit (had RX but not started PTA). INR was elevated on admit and remains at goal (one one dose of coumadin given this admission) -INR= 2.92  Home dose: alternating 7.5mg  and 3.75mg  every other day  Goal of Therapy:  INR 2-3 Monitor platelets by anticoagulation protocol: Yes   Plan:  -Coumadin 2.5mg  po today -Daily PT/INR  Hildred Laser, Pharm D 12/10/2016 11:16 AM

## 2016-12-10 NOTE — Progress Notes (Signed)
Patient stable during 7 a to 7 p shift, up to chair with one standby assist and walker and remained in chair entire shift.  Foley catheter removed as patient no longer meets criteria for one, due to void at this time.  Patient maintains oxygen saturation in the 90's on room air, states she only needs oxygen if she gets upset as this makes her short of breath.

## 2016-12-10 NOTE — Progress Notes (Signed)
PROGRESS NOTE    [name]  JJO:[id number] DOB: 02-22-1946 DOA: 12/03/2016 PCP: [name], MD    Brief Narrative: 71 y.o. female with history of CAD status post non-STEMI in 2017-status post PCI, chronic systolic/diastolic heart failure, right subclavian stenosis status post thrombectomy in 2017 and July 2018 admitted with decompensated heart failure and worsening renal function.    Assessment & Plan:   Principal Problem:   Acute on chronic combined systolic and diastolic CHF (congestive heart failure) (HCC) Active Problems:   Essential hypertension   Coronary artery disease   GERD   Elevated troponin   Acute renal failure superimposed on chronic kidney disease (HCC)   Acute respiratory failure with hypoxia (HCC)   DM type 2 causing vascular disease (HCC)   Pleural effusion on right   Anemia in chronic kidney disease   Atrial fibrillation (HCC)   Ischemic cardiomyopathy   Hypercoagulopathy (HCC)   Goals of care, counseling/discussion   Palliative care encounter Acute on chronic combined systolic and diastolic CHF (EF 16-01% on 11/15/16): Volume status continues to slowly improve, -7.4 L so far-however still has significant amount of lower extremity edema. Continue demadex, appreciate nephrology input.  Acute on chronic kidney disease stage III: AKI hemodynamically mediated, creatinine continues to downtrend with supportive care. Continue to follow electrolytes. Appreciate nephrology input.continue demadex.cr 3.25.demadex started today.  Hypokalemia: Repleted-continue to follow periodically.  Acute respiratory failure with hypoxia: Resolved-now on room air, likely secondary to decompensated heart failure.   Paroxysmal Atrial fibrillation: Rate controlled-continue with beta blocker and amiodarone. Chads2vasc of around 5. INR therapeutic-Coumadin per pharmacy.   Non-STEMI: troponin significantly elevated-a recent nuc stress test on 9/11-without ischemia, has  known hx of CAD-remains on Metoprolol,statin. Cards following, per cardiology-no plans for invasive procedures at this time.   History of CAD status post PCI in 2017: see above-has hx of PCI in April 2017 following NSTEMI  History of pleural effusion: Reviewed discharge summary on 9/23-apparently patient had undergone a recent thoracocentesis-pleural effusion was thought to be secondary to transudate. Currently hypoxia is improved, volume status is improving with diuretics-do not see any indication for thoracocentesis at this time. Pleural effusion is likely related to CHF.  Insulin-dependent DM-2: CBG on the higher side-increase Lantus to 18 units, continue SSI and follow     DVT prophylaxis:coumadin  Code Status: full Family Communcation none Disposition Plan:   tbd Consultants:   Cardio,nephro Procedures:   Antimicrobials:  none  Subjective: Feels breathing is better.  Objective: Vitals:   12/09/16 2100 12/10/16 0042 12/10/16 0500 12/10/16 0923  BP: 136/63 139/67 122/62 115/61  Pulse: 69 68 63 61  Resp: 20 20 20    Temp: 98.7 F (37.1 C) 98 F (36.7 C) 98.6 F (37 C)   TempSrc: Oral Oral Oral   SpO2: 100% 99% 100% 100%  Weight:   88.5 kg (195 lb 1.6 oz)   Height:        Intake/Output Summary (Last 24 hours) at 12/10/16 1320 Last data filed at 12/10/16 0650  Gross per 24 hour  Intake              426 ml  Output             1950 ml  Net            -1524 ml   Filed Weights   12/07/16 0404 12/07/16 0451 12/10/16 0500  Weight: 95.8 kg (211 lb 3.2 oz) 94.2 kg (207 lb 10.8 oz) 88.5 kg (195  lb 1.6 oz)    Examination:  General exam: Appears calm and comfortable  Respiratory system: Clear to auscultation. Respiratory effort normal. Cardiovascular system: decreased breath sounds at b/l bases.Marland Kitchentrace  pedal edema. Gastrointestinal system: Abdomen is nondistended, soft and nontender. No organomegaly or masses felt. Normal bowel sounds heard. Central nervous  system: Alert and oriented. No focal neurological deficits. Extremities: Symmetric 5 x 5 power. Skin: No rashes, lesions or ulcers Psychiatry: Judgement and insight appear normal. Mood & affect appropriate.     Data Reviewed: I have personally reviewed following labs and imaging studies  CBC:  Recent Labs Lab 12/04/16 0003 12/05/16 0533 12/06/16 0335 12/07/16 0500 12/09/16 0519  WBC 7.0 5.0 5.7 5.6 6.6  NEUTROABS  --  3.7 4.4 4.2  --   HGB 8.2* 7.7* 7.8* 8.1* 7.9*  HCT 26.2* 24.5* 25.5* 24.8* 26.4*  MCV 92.9 92.8 93.1 92.2 95.0  PLT 269 283 294 294 366   Basic Metabolic Panel:  Recent Labs Lab 12/05/16 0533 12/06/16 0335 12/07/16 0500 12/08/16 0608 12/09/16 0508 12/10/16 0435  NA 131* 130* 134* 136 137 137  K 4.4 4.1 3.8 3.2* 3.7 3.8  CL 93* 92* 94* 94* 94* 93*  CO2 29 27 28 30 31  33*  GLUCOSE 172* 184* 124* 171* 146* 241*  BUN 94* 96* 88* 77* 66* 60*  CREATININE 4.75* 4.65* 4.21* 3.79* 3.51* 3.25*  CALCIUM 8.4* 8.4* 8.5* 8.5* 8.8* 8.8*  MG 2.7* 2.7* 2.6*  --   --   --   PHOS  --  5.7* 5.3* 4.5 3.9 3.8   GFR: Estimated Creatinine Clearance: 18.2 mL/min (A) (by C-G formula based on SCr of 3.25 mg/dL (H)). Liver Function Tests:  Recent Labs Lab 12/04/16 1011 12/06/16 0335 12/07/16 0500 12/08/16 0608 12/09/16 0508 12/10/16 0435  AST 51*  --   --   --   --   --   ALT 40  --   --   --   --   --   ALKPHOS 125  --   --   --   --   --   BILITOT 0.7  --   --   --   --   --   PROT 6.6  --   --   --   --   --   ALBUMIN 2.5* 2.3* 2.2* 2.3* 2.4* 2.4*   No results for input(s): LIPASE, AMYLASE in the last 168 hours. No results for input(s): AMMONIA in the last 168 hours. Coagulation Profile:  Recent Labs Lab 12/06/16 0335 12/07/16 0500 12/08/16 0608 12/09/16 0519 12/10/16 0435  INR 4.24* 3.48 2.69 2.92 2.92   Cardiac Enzymes:  Recent Labs Lab 12/04/16 0004 12/04/16 1011  TROPONINI 7.82* 5.78*   BNP (last 3 results) No results for input(s):  PROBNP in the last 8760 hours. HbA1C: No results for input(s): HGBA1C in the last 72 hours. CBG:  Recent Labs Lab 12/09/16 1156 12/09/16 1626 12/09/16 2129 12/10/16 0729 12/10/16 1214  GLUCAP 178* 249* 313* 211* 199*   Lipid Profile: No results for input(s): CHOL, HDL, LDLCALC, TRIG, CHOLHDL, LDLDIRECT in the last 72 hours. Thyroid Function Tests: No results for input(s): TSH, T4TOTAL, FREET4, T3FREE, THYROIDAB in the last 72 hours. Anemia Panel: No results for input(s): VITAMINB12, FOLATE, FERRITIN, TIBC, IRON, RETICCTPCT in the last 72 hours. Sepsis Labs: No results for input(s): PROCALCITON, LATICACIDVEN in the last 168 hours.  Recent Results (from the past 240 hour(s))  MRSA PCR Screening  Status: None   Collection Time: 12/03/16 10:41 AM  Result Value Ref Range Status   MRSA by PCR NEGATIVE NEGATIVE Final    Comment:        The GeneXpert MRSA Assay (FDA approved for NASAL specimens only), is one component of a comprehensive MRSA colonization surveillance program. It is not intended to diagnose MRSA infection nor to guide or monitor treatment for MRSA infections.          Radiology Studies: No results found.      Scheduled Meds: . ALPRAZolam  0.5 mg Oral QHS  . amiodarone  200 mg Oral BID  . Chlorhexidine Gluconate Cloth  6 each Topical Daily  . clopidogrel  75 mg Oral Daily  . [START ON 12/11/2016] darbepoetin (ARANESP) injection - NON-DIALYSIS  200 mcg Subcutaneous Q Sun-1800  . feeding supplement (GLUCERNA SHAKE)  237 mL Oral BID BM  . fluticasone  1 spray Each Nare Daily  . insulin aspart  0-5 Units Subcutaneous QHS  . insulin aspart  0-9 Units Subcutaneous TID WC  . insulin glargine  18 Units Subcutaneous QHS  . isosorbide mononitrate  30 mg Oral Daily  . latanoprost  1 drop Both Eyes QHS  . metoprolol succinate  25 mg Oral Q breakfast  . pantoprazole  40 mg Oral Daily  . polyethylene glycol  17 g Oral BID  . rosuvastatin  10 mg Oral QHS    . sodium chloride flush  10-40 mL Intracatheter Q12H  . sodium chloride flush  3 mL Intravenous Q12H  . torsemide  80 mg Oral BID  . warfarin  2.5 mg Oral ONCE-1800  . Warfarin - Pharmacist Dosing Inpatient   Does not apply q1800   Continuous Infusions: . sodium chloride    . ferumoxytol Stopped (12/06/16 1900)     LOS: 7 days     Georgette Shell, MD [facility]  If 7PM-7AM, please contact night-coverage [email or web address] Password [email or web address] 12/10/2016, 1:20 PM

## 2016-12-10 NOTE — Progress Notes (Signed)
Progress Note  Patient Name: [name] Date of Encounter: 12/10/2016  Primary Cardiologist: Bronson Ing  Subjective   Feels well sitting in chair having breakfast .  Inpatient Medications    Scheduled Meds: . ALPRAZolam  0.5 mg Oral QHS  . amiodarone  200 mg Oral BID  . Chlorhexidine Gluconate Cloth  6 each Topical Daily  . [START ON 12/11/2016] darbepoetin (ARANESP) injection - NON-DIALYSIS  200 mcg Subcutaneous Q Sun-1800  . feeding supplement (GLUCERNA SHAKE)  237 mL Oral BID BM  . fluticasone  1 spray Each Nare Daily  . insulin aspart  0-5 Units Subcutaneous QHS  . insulin aspart  0-9 Units Subcutaneous TID WC  . insulin glargine  18 Units Subcutaneous QHS  . isosorbide mononitrate  30 mg Oral Daily  . latanoprost  1 drop Both Eyes QHS  . metoprolol succinate  25 mg Oral Q breakfast  . pantoprazole  40 mg Oral Daily  . polyethylene glycol  17 g Oral BID  . rosuvastatin  10 mg Oral QHS  . sodium chloride flush  10-40 mL Intracatheter Q12H  . sodium chloride flush  3 mL Intravenous Q12H  . Warfarin - Pharmacist Dosing Inpatient   Does not apply q1800   Continuous Infusions: . sodium chloride    . ferumoxytol Stopped (12/06/16 1900)  . furosemide Stopped (12/10/16 0800)   PRN Meds: sodium chloride, acetaminophen, albuterol, guaiFENesin-dextromethorphan, ondansetron (ZOFRAN) IV, sodium chloride flush, sodium chloride flush   Vital Signs    Vitals:   12/09/16 1630 12/09/16 2100 12/10/16 0042 12/10/16 0500  BP: (!) 143/71 136/63 139/67 122/62  Pulse: 69 69 68 63  Resp: (!) 23 20 20 20   Temp: 97.9 F (36.6 C) 98.7 F (37.1 C) 98 F (36.7 C) 98.6 F (37 C)  TempSrc: Oral Oral Oral Oral  SpO2: 99% 100% 99% 100%  Weight:    195 lb 1.6 oz (88.5 kg)  Height:        Intake/Output Summary (Last 24 hours) at 12/10/16 0833 Last data filed at 12/10/16 0650  Gross per 24 hour  Intake              426 ml  Output             1950 ml  Net            -1524 ml     Filed Weights   12/07/16 0404 12/07/16 0451 12/10/16 0500  Weight: 211 lb 3.2 oz (95.8 kg) 207 lb 10.8 oz (94.2 kg) 195 lb 1.6 oz (88.5 kg)    Telemetry    NSR - Personally Reviewed  ECG    No new tracing - Personally Reviewed  Physical Exam  Affect appropriate Elderly black female  HEENT: normal Neck supple with no adenopathy JVP normal no bruits no thyromegaly Lungs clear with no wheezing and good diaphragmatic motion Heart:  S1/S2 no murmur, no rub, gallop or click PMI normal Abdomen: benighn, BS positve, no tenderness, no AAA no bruit.  No HSM or HJR Distal pulses intact with no bruits Plus one bilateral edema Neuro non-focal Skin warm and dry No muscular weakness  Labs    Chemistry Recent Labs Lab 12/04/16 1011  12/08/16 0608 12/09/16 0508 12/10/16 0435  NA 128*  < > 136 137 137  K 4.4  < > 3.2* 3.7 3.8  CL 92*  < > 94* 94* 93*  CO2 23  < > 30 31 33*  GLUCOSE 289*  < >  171* 146* 241*  BUN 87*  < > 77* 66* 60*  CREATININE 4.39*  < > 3.79* 3.51* 3.25*  CALCIUM 8.3*  < > 8.5* 8.8* 8.8*  PROT 6.6  --   --   --   --   ALBUMIN 2.5*  < > 2.3* 2.4* 2.4*  AST 51*  --   --   --   --   ALT 40  --   --   --   --   ALKPHOS 125  --   --   --   --   BILITOT 0.7  --   --   --   --   GFRNONAA 9*  < > 11* 12* 13*  GFRAA 11*  < > 13* 14* 15*  ANIONGAP 13  < > 12 12 11   < > = values in this interval not displayed.   Hematology  Recent Labs Lab 12/06/16 0335 12/07/16 0500 12/09/16 0519  WBC 5.7 5.6 6.6  RBC 2.74* 2.69* 2.78*  HGB 7.8* 8.1* 7.9*  HCT 25.5* 24.8* 26.4*  MCV 93.1 92.2 95.0  MCH 28.5 30.1 28.4  MCHC 30.6 32.7 29.9*  RDW 17.5* 17.8* 18.0*  PLT 294 294 302    Cardiac Enzymes  Recent Labs Lab 12/04/16 0004 12/04/16 1011  TROPONINI 7.82* 5.78*   No results for input(s): TROPIPOC in the last 168 hours.   BNP  Recent Labs Lab 12/04/16 1820  BNP 2,222.9*     DDimer No results for input(s): DDIMER in the last 168 hours.    Radiology    No results found.  Cardiac Studies   11/2016 nuclear stress: large inferolateralscar, LVEF 22%.  11/2015 cath: patent RCA stents, mod LAD disease, LCX CTO. Recs for medical thearpy.  11/2016 LVEF 30-35%, grade II diastolic dysfunciton  Patient Profile     71 y.o. 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   Assessment & Plan     1. CHF: continues to diurese . Diuretics per Dr. Lorrene Reid. 2. CAD: angina free, no evidence of meaningful areas of reversible ischemia on recent nuclear stress test. No plan for invasive evaluation despite recent small non-STEMI. 3. PAFib: controlled on amiodarone. Reduce . Reduce the dose of amiodarone to maintenance 200 mg daily next week. 4. Warfarin: INR therapeutic level 5. Ac on chr renal failure: per renal stable despite ongoing diuresis .   For questions or updates, please contact [place] Please consult [email or web address] for contact info under Cardiology/STEMI.      Signed, Jenkins Rouge, MD  12/10/2016, 8:33 AM

## 2016-12-10 NOTE — Progress Notes (Signed)
[facility] Rounding Note  Subjective/Interval History:  Continued good UOP and slow renal recovery Feeling better Had some back pain during the night "took 3 hours for them to get me situated" (back pain chronic issue) Good appetite  Objective Vital signs in last 24 hours: Vitals:   12/09/16 2100 12/10/16 0042 12/10/16 0500 12/10/16 0923  BP: 136/63 139/67 122/62 115/61  Pulse: 69 68 63 61  Resp: 20 20 20    Temp: 98.7 F (37.1 C) 98 F (36.7 C) 98.6 F (37 C)   TempSrc: Oral Oral Oral   SpO2: 100% 99% 100% 100%  Weight:   88.5 kg (195 lb 1.6 oz)   Height:        I/O last 3 completed shifts: In: 094 [P.O.:358; IV Piggyback:186] Out: 2775 [Urine:2775] No intake/output data recorded.   Physical Exam:  Blood pressure 115/61, pulse 61, temperature 98.6 F (37 C), temperature source Oral, resp. rate 20, height 5\' 8"  (1.727 m), weight 88.5 kg (195 lb 1.6 oz), SpO2 100 %.  Awake, alert. Weight down to 195 (211 at time diuretics initiated) "good outpt dry wt has been 193-197 previously though I think will be lower Other VS as noted Lungs grossly clear S1S2 No S3 Sinus brady Abd soft and not tender 1+ pitting edema both LE's - daily improvement No asterixus  Recent Labs Lab 12/04/16 1011 12/05/16 0533 12/06/16 0335 12/07/16 0500 12/08/16 0608 12/09/16 0508 12/10/16 0435  NA 128* 131* 130* 134* 136 137 137  K 4.4 4.4 4.1 3.8 3.2* 3.7 3.8  CL 92* 93* 92* 94* 94* 94* 93*  CO2 23 29 27 28 30 31  33*  GLUCOSE 289* 172* 184* 124* 171* 146* 241*  BUN 87* 94* 96* 88* 77* 66* 60*  CREATININE 4.39* 4.75* 4.65* 4.21* 3.79* 3.51* 3.25*  CALCIUM 8.3* 8.4* 8.4* 8.5* 8.5* 8.8* 8.8*  PHOS  --   --  5.7* 5.3* 4.5 3.9 3.8    Recent Labs Lab 12/04/16 1011  12/08/16 0608 12/09/16 0508 12/10/16 0435  AST 51*  --   --   --   --   ALT 40  --   --   --   --   ALKPHOS 125  --   --   --   --   BILITOT 0.7  --   --   --   --   PROT 6.6  --   --   --   --   ALBUMIN 2.5*  < > 2.3* 2.4*  2.4*  < > = values in this interval not displayed.   Recent Labs Lab 12/05/16 0533 12/06/16 0335 12/07/16 0500 12/09/16 0519  WBC 5.0 5.7 5.6 6.6  NEUTROABS 3.7 4.4 4.2  --   HGB 7.7* 7.8* 8.1* 7.9*  HCT 24.5* 25.5* 24.8* 26.4*  MCV 92.8 93.1 92.2 95.0  PLT 283 294 294 302    Recent Labs Lab 12/04/16 0004 12/04/16 1011  TROPONINI 7.82* 5.78*   Lab Results  Component Value Date   INR 2.92 12/10/2016   INR 2.92 12/09/2016   INR 2.69 12/08/2016    Recent Labs Lab 12/09/16 0744 12/09/16 1156 12/09/16 1626 12/09/16 2129 12/10/16 0729  GLUCAP 149* 178* 249* 313* 211*   Iron/TIBC/Ferritin/ %Sat    Component Value Date/Time   IRON 23 (L) 12/06/2016 0335   TIBC 276 12/06/2016 0335   IRONPCTSAT 8 (L) 12/06/2016 0335   Medications: . sodium chloride    . ferumoxytol Stopped (12/06/16 1900)  . furosemide Stopped (  12/10/16 0800)   . ALPRAZolam  0.5 mg Oral QHS  . amiodarone  200 mg Oral BID  . Chlorhexidine Gluconate Cloth  6 each Topical Daily  . [START ON 12/11/2016] darbepoetin (ARANESP) injection - NON-DIALYSIS  200 mcg Subcutaneous Q Sun-1800  . feeding supplement (GLUCERNA SHAKE)  237 mL Oral BID BM  . fluticasone  1 spray Each Nare Daily  . insulin aspart  0-5 Units Subcutaneous QHS  . insulin aspart  0-9 Units Subcutaneous TID WC  . insulin glargine  18 Units Subcutaneous QHS  . isosorbide mononitrate  30 mg Oral Daily  . latanoprost  1 drop Both Eyes QHS  . metoprolol succinate  25 mg Oral Q breakfast  . pantoprazole  40 mg Oral Daily  . polyethylene glycol  17 g Oral BID  . rosuvastatin  10 mg Oral QHS  . sodium chloride flush  10-40 mL Intracatheter Q12H  . sodium chloride flush  3 mL Intravenous Q12H  . warfarin  2.5 mg Oral ONCE-1800  . Warfarin - Pharmacist Dosing Inpatient   Does not apply q1800    Background: 71 y.o. 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 Lorrene Reid - BL creatinine mid 2's) admitted with SOB and fluid overload initially to [facility]. AKI hemodynamically driven (bradycardia/low BP). Creatinine peaked at 4.75  Assessment/Recommendations  1. 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 DM, HTN, OAB issues.  1. BL creatinine usual mid to low 2's (2.0  as recently as 11/27/16; I follow at [facility])  2. AKI on CKD in setting of AFib, large doses toprol (dose reduced now), low HR (improved) and low BP. I believe mostly hemodynamically driven.  3. Diuresing well with IV lasix 120 Q12H, renal function continues slow improvement.  4. Weight down to 195 (211 at time diuretics initiated) "good outpt dry wt has been 193-197 previously though I think will be lower based on amount of edema she still has at weight of 195 5. Most recent outpt dose of torsemide was 80 BID which worked when creatinine in the 2's, not sure if will be effective now, but will try to transition from the IV lasix to torsemide.   2. Anemia  - Prior good outpt Hb's. Has required Feraheme in the past, never Aranesp. Started that here. Darbepoetin 100 mcg/week (1st dosed 9/30). ^'d to 200 for next dose. Fe quite low - Feraheme dosed 10/2. Next dose will be 10/9.  3. CAD - + trops falling. Several prior interventions. No plans for cath so far this trip. Risk outweighs benefit per cards.  4. DM - per primary service 5. Supratherapeutic INR - pharmacy monitoring. 6. Hypokalemia - KDur 30 BID X 2 doses 10/4.  Jamal Maes, MD [facility] [phone] pager 12/10/2016, 12:12 PM

## 2016-12-11 LAB — GLUCOSE, CAPILLARY
GLUCOSE-CAPILLARY: 144 mg/dL — AB (ref 65–99)
GLUCOSE-CAPILLARY: 356 mg/dL — AB (ref 65–99)
Glucose-Capillary: 146 mg/dL — ABNORMAL HIGH (ref 65–99)
Glucose-Capillary: 299 mg/dL — ABNORMAL HIGH (ref 65–99)

## 2016-12-11 LAB — RENAL FUNCTION PANEL
ANION GAP: 11 (ref 5–15)
Albumin: 2.2 g/dL — ABNORMAL LOW (ref 3.5–5.0)
BUN: 57 mg/dL — ABNORMAL HIGH (ref 6–20)
CALCIUM: 8.7 mg/dL — AB (ref 8.9–10.3)
CO2: 32 mmol/L (ref 22–32)
CREATININE: 3.42 mg/dL — AB (ref 0.44–1.00)
Chloride: 93 mmol/L — ABNORMAL LOW (ref 101–111)
GFR, EST AFRICAN AMERICAN: 14 mL/min — AB (ref >60)
GFR, EST NON AFRICAN AMERICAN: 12 mL/min — AB (ref >60)
Glucose, Bld: 183 mg/dL — ABNORMAL HIGH (ref 65–99)
PHOSPHORUS: 3.8 mg/dL (ref 2.5–4.6)
Potassium: 3.5 mmol/L (ref 3.5–5.1)
SODIUM: 136 mmol/L (ref 135–145)

## 2016-12-11 LAB — PROTIME-INR
INR: 3.22
Prothrombin Time: 32.7 seconds — ABNORMAL HIGH (ref 11.4–15.2)

## 2016-12-11 NOTE — Progress Notes (Signed)
Progress Note  Patient Name: [name] Date of Encounter: 12/11/2016  Primary Cardiologist: Bronson Ing  Subjective   Feels well sitting in chair having breakfast .  Inpatient Medications    Scheduled Meds: . ALPRAZolam  0.5 mg Oral QHS  . amiodarone  200 mg Oral BID  . Chlorhexidine Gluconate Cloth  6 each Topical Daily  . clopidogrel  75 mg Oral Daily  . darbepoetin (ARANESP) injection - NON-DIALYSIS  200 mcg Subcutaneous Q Sun-1800  . feeding supplement (GLUCERNA SHAKE)  237 mL Oral BID BM  . fluticasone  1 spray Each Nare Daily  . insulin aspart  0-5 Units Subcutaneous QHS  . insulin aspart  0-9 Units Subcutaneous TID WC  . insulin glargine  18 Units Subcutaneous QHS  . isosorbide mononitrate  30 mg Oral Daily  . latanoprost  1 drop Both Eyes QHS  . metoprolol succinate  25 mg Oral Q breakfast  . pantoprazole  40 mg Oral Daily  . polyethylene glycol  17 g Oral BID  . rosuvastatin  10 mg Oral QHS  . sodium chloride flush  10-40 mL Intracatheter Q12H  . sodium chloride flush  3 mL Intravenous Q12H  . torsemide  80 mg Oral BID  . Warfarin - Pharmacist Dosing Inpatient   Does not apply q1800   Continuous Infusions: . sodium chloride    . ferumoxytol Stopped (12/06/16 1900)   PRN Meds: sodium chloride, acetaminophen, albuterol, guaiFENesin-dextromethorphan, ondansetron (ZOFRAN) IV, sodium chloride flush, sodium chloride flush   Vital Signs    Vitals:   12/10/16 1300 12/10/16 1934 12/10/16 2356 12/11/16 0428  BP: (!) 117/52 (!) 117/47 (!) 131/52 136/65  Pulse: (!) 58 62 (!) 59 61  Resp: 18 18 18 18   Temp: 98.7 F (37.1 C) 98.1 F (36.7 C) 97.9 F (36.6 C) 98.4 F (36.9 C)  TempSrc: Oral Oral Oral Oral  SpO2: 100% 100% 99% 99%  Weight:    202 lb 3.2 oz (91.7 kg)  Height:        Intake/Output Summary (Last 24 hours) at 12/11/16 0902 Last data filed at 12/10/16 2345  Gross per 24 hour  Intake              720 ml  Output             1200 ml  Net              -480 ml   Filed Weights   12/07/16 0451 12/10/16 0500 12/11/16 0428  Weight: 207 lb 10.8 oz (94.2 kg) 195 lb 1.6 oz (88.5 kg) 202 lb 3.2 oz (91.7 kg)    Telemetry    NSR - Personally Reviewed  ECG    12/04/16 SR poor R wave progression no acute changes   Physical Exam  Affect appropriate Elderly black female  HEENT: normal Neck supple with no adenopathy JVP normal no bruits no thyromegaly Lungs clear with no wheezing and good diaphragmatic motion Heart:  S1/S2 no murmur, no rub, gallop or click PMI normal Abdomen: benighn, BS positve, no tenderness, no AAA no bruit.  No HSM or HJR Distal pulses intact with no bruits Plus one bilateral edema Neuro non-focal Skin warm and dry No muscular weakness  Labs    Chemistry Recent Labs Lab 12/04/16 1011  12/09/16 0508 12/10/16 0435 12/11/16 0312  NA 128*  < > 137 137 136  K 4.4  < > 3.7 3.8 3.5  CL 92*  < > 94* 93* 93*  CO2 23  < > 31 33* 32  GLUCOSE 289*  < > 146* 241* 183*  BUN 87*  < > 66* 60* 57*  CREATININE 4.39*  < > 3.51* 3.25* 3.42*  CALCIUM 8.3*  < > 8.8* 8.8* 8.7*  PROT 6.6  --   --   --   --   ALBUMIN 2.5*  < > 2.4* 2.4* 2.2*  AST 51*  --   --   --   --   ALT 40  --   --   --   --   ALKPHOS 125  --   --   --   --   BILITOT 0.7  --   --   --   --   GFRNONAA 9*  < > 12* 13* 12*  GFRAA 11*  < > 14* 15* 14*  ANIONGAP 13  < > 12 11 11   < > = values in this interval not displayed.   Hematology  Recent Labs Lab 12/06/16 0335 12/07/16 0500 12/09/16 0519  WBC 5.7 5.6 6.6  RBC 2.74* 2.69* 2.78*  HGB 7.8* 8.1* 7.9*  HCT 25.5* 24.8* 26.4*  MCV 93.1 92.2 95.0  MCH 28.5 30.1 28.4  MCHC 30.6 32.7 29.9*  RDW 17.5* 17.8* 18.0*  PLT 294 294 302    Cardiac Enzymes  Recent Labs Lab 12/04/16 1011  TROPONINI 5.78*   No results for input(s): TROPIPOC in the last 168 hours.   BNP  Recent Labs Lab 12/04/16 1820  BNP 2,222.9*     DDimer No results for input(s): DDIMER in the last 168 hours.    Radiology    No results found.  Cardiac Studies   11/2016 nuclear stress: large inferolateralscar, LVEF 22%.  11/2015 cath: patent RCA stents, mod LAD disease, LCX CTO. Recs for medical thearpy.  11/2016 LVEF 30-35%, grade II diastolic dysfunciton  Patient Profile     71 y.o. 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   Assessment & Plan     1. CHF: continues to diurese . Diuretics per Dr. Lorrene Reid. 2. CAD: angina free,low risk myovue renal failure precludes cath medical Rx Brillinta changed to Plavix given need for anticoagulation  3. PAFib: controlled on amiodarone. Reduce . Reduce the dose of amiodarone to maintenance 200 mg daily next week. 4. Warfarin: INR therapeutic level 5. Ac on chr renal failure: per renal stable despite ongoing diuresis .   For questions or updates, please contact [place] Please consult [email or web address] for contact info under Cardiology/STEMI.      Signed, Jenkins Rouge, MD  12/11/2016, 9:02 AM

## 2016-12-11 NOTE — Plan of Care (Signed)
Problem: Tissue Perfusion: Goal: Risk factors for ineffective tissue perfusion will decrease Outcome: Progressing Oxygen saturation in the 90's on room air, uses oxygen as needed for comfort   Problem: Fluid Volume: Goal: Ability to maintain a balanced intake and output will improve Outcome: Progressing Continues to diurese   Problem: Activity: Goal: Ability to tolerate increased activity will improve Outcome: Progressing Up to chair with walker and one assist

## 2016-12-11 NOTE — Progress Notes (Signed)
[facility] Rounding Note  Subjective/Interval History:  Switched from IV lasix to po torsemide at last outpt home dose yesterday Appears still good UOP with that regimen Large weight variation so wt's unreliable (7 lb GAIN since yesterday despite neg fluid balance  Says "best day so far"  Eating lunch, food good Still get SOB with exertion and wonders if will need O2 at home  Objective Vital signs in last 24 hours: Vitals:   12/10/16 2356 12/11/16 0428 12/11/16 0949 12/11/16 1135  BP: (!) 131/52 136/65 (!) 120/58 (!) 119/54  Pulse: (!) 59 61 61 75  Resp: 18 18  18   Temp: 97.9 F (36.6 C) 98.4 F (36.9 C)  98 F (36.7 C)  TempSrc: Oral Oral  Oral  SpO2: 99% 99% 100% 100%  Weight:  91.7 kg (202 lb 3.2 oz)    Height:        I/O last 3 completed shifts: In: 3875 [P.O.:1200; IV Piggyback:186] Out: 1900 [Urine:1900] Total I/O In: 240 [P.O.:240] Out: -    Physical Exam:  Blood pressure (!) 119/54, pulse 75, temperature 98 F (36.7 C), temperature source Oral, resp. rate 18, height 5\' 8"  (1.727 m), weight 91.7 kg (202 lb 3.2 oz), SpO2 100 %.  Awake, alert.  Other VS as noted Lungs grossly clear S1S2 No S3 Sinus brady Abd soft and not tender 1+ pitting edema both LE's - daily improvement but still sig pitting edema No asterixus  Recent Labs Lab 12/05/16 0533 12/06/16 0335 12/07/16 0500 12/08/16 0608 12/09/16 0508 12/10/16 0435 12/11/16 0312  NA 131* 130* 134* 136 137 137 136  K 4.4 4.1 3.8 3.2* 3.7 3.8 3.5  CL 93* 92* 94* 94* 94* 93* 93*  CO2 29 27 28 30 31  33* 32  GLUCOSE 172* 184* 124* 171* 146* 241* 183*  BUN 94* 96* 88* 77* 66* 60* 57*  CREATININE 4.75* 4.65* 4.21* 3.79* 3.51* 3.25* 3.42*  CALCIUM 8.4* 8.4* 8.5* 8.5* 8.8* 8.8* 8.7*  PHOS  --  5.7* 5.3* 4.5 3.9 3.8 3.8    Recent Labs Lab 12/09/16 0508 12/10/16 0435 12/11/16 0312  ALBUMIN 2.4* 2.4* 2.2*     Recent Labs Lab 12/05/16 0533 12/06/16 0335 12/07/16 0500 12/09/16 0519  WBC 5.0 5.7 5.6 6.6   NEUTROABS 3.7 4.4 4.2  --   HGB 7.7* 7.8* 8.1* 7.9*  HCT 24.5* 25.5* 24.8* 26.4*  MCV 92.8 93.1 92.2 95.0  PLT 283 294 294 302   No results for input(s): CKTOTAL, CKMB, CKMBINDEX, TROPONINI in the last 168 hours. Lab Results  Component Value Date   INR 3.22 12/11/2016   INR 2.92 12/10/2016   INR 2.92 12/09/2016    Recent Labs Lab 12/10/16 1214 12/10/16 1612 12/10/16 2038 12/11/16 0740 12/11/16 1133  GLUCAP 199* 186* 211* 144* 146*   Iron/TIBC/Ferritin/ %Sat    Component Value Date/Time   IRON 23 (L) 12/06/2016 0335   TIBC 276 12/06/2016 0335   IRONPCTSAT 8 (L) 12/06/2016 0335   Medications: . sodium chloride    . ferumoxytol Stopped (12/06/16 1900)   . ALPRAZolam  0.5 mg Oral QHS  . amiodarone  200 mg Oral BID  . Chlorhexidine Gluconate Cloth  6 each Topical Daily  . clopidogrel  75 mg Oral Daily  . darbepoetin (ARANESP) injection - NON-DIALYSIS  200 mcg Subcutaneous Q Sun-1800  . feeding supplement (GLUCERNA SHAKE)  237 mL Oral BID BM  . fluticasone  1 spray Each Nare Daily  . insulin aspart  0-5 Units Subcutaneous  QHS  . insulin aspart  0-9 Units Subcutaneous TID WC  . insulin glargine  18 Units Subcutaneous QHS  . isosorbide mononitrate  30 mg Oral Daily  . latanoprost  1 drop Both Eyes QHS  . metoprolol succinate  25 mg Oral Q breakfast  . pantoprazole  40 mg Oral Daily  . polyethylene glycol  17 g Oral BID  . rosuvastatin  10 mg Oral QHS  . sodium chloride flush  10-40 mL Intracatheter Q12H  . sodium chloride flush  3 mL Intravenous Q12H  . torsemide  80 mg Oral BID  . Warfarin - Pharmacist Dosing Inpatient   Does not apply q1800    Background: 71 y.o. 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 Lorrene Reid - BL creatinine mid 2's) admitted with SOB and fluid overload initially to [facility]. AKI hemodynamically driven (bradycardia/low BP). Creatinine peaked at 4.75  Assessment/Recommendations  1. 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 DM, HTN, OAB issues.  1. BL creatinine usual mid to low 2's ( I follow at [facility])  2. AKI on CKD in setting of AFib, large doses toprol (dose reduced now), low HR (improved) and low BP. I believe mostly hemodynamically driven.  3. Diuresed well with IV lasix, switched to po torsemide yesterday 10/6 (80 BID was last home dose) and appears good UOP on that so far. Still very overloaded on exam in terms of peripheral edema and is symptomatic still breathing wise. 4. Creatinine may be at a new baseline mid 3's 5. Will need to f/u in the office with me at discharge  2. Anemia  - Prior good outpt Hb's. Has required Feraheme in the past, never Aranesp. Started that here. Darbepoetin 100 mcg/week (1st dosed 9/30). ^'d to 200 for next dose. Fe quite low - Feraheme dosed 10/2. Next dose will be 10/9.  3. CAD - + trops falling. Several prior interventions. No plans for cath so far this trip. Risk outweighs benefit per cards.  4. DM - per primary service 5. Supratherapeutic INR - pharmacy monitoring. 6. Hypokalemia - KDur 30 BID X 2 doses 10/4. Stable. Dose K prn  Jamal Maes, MD [facility] [phone] pager 12/11/2016, 12:48 PM

## 2016-12-11 NOTE — Progress Notes (Signed)
[place] for warfarin Indication: atrial fibrillation  Allergies  Allergen Reactions  . Ace Inhibitors Cough  . Amlodipine Other (See Comments)    Edema   . Codeine Rash    Patient Measurements: Height: 5\' 8"  (172.7 cm) Weight: 202 lb 3.2 oz (91.7 kg) IBW/kg (Calculated) : 63.9  Assessment: 71 YOF on Coumadin PTA for Afib per records from [place] = 5. She is noted on amiodarone - new start this admit (had RX but not started PTA). INR was elevated on admit and is above goal today (likely in setting of new amiodarone and HF). Would anticipate at least a 30% reduction in home dose with addition of amiodarone.  -INR= 2.92  Home dose: alternating 7.5mg  and 3.75mg  every other day  Goal of Therapy:  INR 2-3 Monitor platelets by anticoagulation protocol: Yes   Plan:  -Hold coumadin today -Daily PT/INR  Hildred Laser, Pharm D 12/11/2016 10:30 AM

## 2016-12-11 NOTE — Progress Notes (Signed)
PROGRESS NOTE    [name]  CWC:[id number] DOB: 1945-03-10 DOA: 12/03/2016 PCP: [name], MD   Brief Narrative: 71 y.o.femalewith history of CAD status post non-STEMI in 2017-status post PCI, chronic systolic/diastolic heart failure, right subclavian stenosis status post thrombectomy in 2017 and July 2018 admitted with decompensated heart failure and worsening renal function.   Assessment & Plan:   Principal Problem:   Acute on chronic combined systolic and diastolic CHF (congestive heart failure) (HCC) Active Problems:   Essential hypertension   Coronary artery disease   GERD   Elevated troponin   Acute renal failure superimposed on chronic kidney disease (HCC)   Acute respiratory failure with hypoxia (HCC)   DM type 2 causing vascular disease (HCC)   Pleural effusion on right   Anemia in chronic kidney disease   Atrial fibrillation (HCC)   Ischemic cardiomyopathy   Hypercoagulopathy (HCC)   Goals of care, counseling/discussion   Palliative care encounter Acute on chronic combined systolic and diastolic CHF (EF 76-16% on 11/15/16):Volume status continues to slowly improve, -7.4 L so far-however still has significant amount of lower extremity edema. On  demadex 80 mg bid. appreciate nephrology input.she actually gained weight it looks like..88.5 to 91.7 kg.  Acute on chronic kidney disease stage WVP:XTGGYIRSWNIOEVOJJK mediated, creatinine continues to downtrend with supportive care. Continue to follow electrolytes. Appreciate nephrology input.continue demadex.cr 3.25.demadex started yesterday.  Hypokalemia:Repleted-continue to follow periodically.  Acute respiratory failure with hypoxia:Resolved-now on room air, likely secondary to decompensated heart failure.   Paroxysmal Atrial fibrillation:Rate controlled-continue with beta blocker and amiodarone. Chads2vascof around 5. INR therapeutic-Coumadin per pharmacy.   Non-STEMI: troponin significantly  elevated-a recent nuc stress test on 9/11-without ischemia, has known hx of CAD-remains on Metoprolol,statin. Cards following, per cardiology-no plans for invasive procedures at this time.   History of CAD status post PCI in 2017: see above-has hx of PCI in April 2017 following NSTEMI  History of pleural effusion: Reviewed discharge summary on 9/23-apparently patient had undergone a recent thoracocentesis-pleural effusion was thought to be secondary to transudate. Currently hypoxia is improved, volume status is improving with diuretics-do not see any indication for thoracocentesis at this time. Pleural effusion is likely related to CHF.  Insulin-dependent DM-2:CBG on the higher side-increase Lantus to 18 units, continue SSI and follow     DVT prophylaxis: coumadin.inr elevated.coumadin on hold today. Code Status:full Family Communication: none Disposition Plan:tbd  Consultants:  Card/nephro  Procedures:   Antimicrobials: none   Subjective: Eating breakfast.denies chest pain,shortness of breath.  Objective: Vitals:   12/10/16 2356 12/11/16 0428 12/11/16 0949 12/11/16 1135  BP: (!) 131/52 136/65 (!) 120/58 (!) 119/54  Pulse: (!) 59 61 61 75  Resp: 18 18  18   Temp: 97.9 F (36.6 C) 98.4 F (36.9 C)  98 F (36.7 C)  TempSrc: Oral Oral  Oral  SpO2: 99% 99% 100% 100%  Weight:  91.7 kg (202 lb 3.2 oz)    Height:        Intake/Output Summary (Last 24 hours) at 12/11/16 1229 Last data filed at 12/11/16 0949  Gross per 24 hour  Intake              960 ml  Output              900 ml  Net               60 ml   Filed Weights   12/07/16 0451 12/10/16 0500 12/11/16 0428  Weight: 94.2 kg (207  lb 10.8 oz) 88.5 kg (195 lb 1.6 oz) 91.7 kg (202 lb 3.2 oz)    Examination:  General exam: Appears calm and comfortable  Respiratory system: decreased breath sounds b/l bases. Respiratory effort normal. Cardiovascular system: S1 & S2 heard, RRR. No JVD, murmurs, rubs, gallops or  clicks. No pedal edema. Gastrointestinal system: Abdomen is nondistended, soft and nontender. No organomegaly or masses felt. Normal bowel sounds heard. Central nervous system: Alert and oriented. No focal neurological deficits. Extremities: trace edema Skin: No rashes, lesions or ulcers Psychiatry: Judgement and insight appear normal. Mood & affect appropriate.     Data Reviewed: I have personally reviewed following labs and imaging studies  CBC:  Recent Labs Lab 12/05/16 0533 12/06/16 0335 12/07/16 0500 12/09/16 0519  WBC 5.0 5.7 5.6 6.6  NEUTROABS 3.7 4.4 4.2  --   HGB 7.7* 7.8* 8.1* 7.9*  HCT 24.5* 25.5* 24.8* 26.4*  MCV 92.8 93.1 92.2 95.0  PLT 283 294 294 737   Basic Metabolic Panel:  Recent Labs Lab 12/05/16 0533  12/06/16 0335 12/07/16 0500 12/08/16 0608 12/09/16 0508 12/10/16 0435 12/11/16 0312  NA 131*  --  130* 134* 136 137 137 136  K 4.4  --  4.1 3.8 3.2* 3.7 3.8 3.5  CL 93*  --  92* 94* 94* 94* 93* 93*  CO2 29  --  27 28 30 31  33* 32  GLUCOSE 172*  --  184* 124* 171* 146* 241* 183*  BUN 94*  --  96* 88* 77* 66* 60* 57*  CREATININE 4.75*  --  4.65* 4.21* 3.79* 3.51* 3.25* 3.42*  CALCIUM 8.4*  --  8.4* 8.5* 8.5* 8.8* 8.8* 8.7*  MG 2.7*  --  2.7* 2.6*  --   --   --   --   PHOS  --   < > 5.7* 5.3* 4.5 3.9 3.8 3.8  < > = values in this interval not displayed. GFR: Estimated Creatinine Clearance: 17.6 mL/min (A) (by C-G formula based on SCr of 3.42 mg/dL (H)). Liver Function Tests:  Recent Labs Lab 12/07/16 0500 12/08/16 0608 12/09/16 0508 12/10/16 0435 12/11/16 0312  ALBUMIN 2.2* 2.3* 2.4* 2.4* 2.2*   No results for input(s): LIPASE, AMYLASE in the last 168 hours. No results for input(s): AMMONIA in the last 168 hours. Coagulation Profile:  Recent Labs Lab 12/07/16 0500 12/08/16 0608 12/09/16 0519 12/10/16 0435 12/11/16 0312  INR 3.48 2.69 2.92 2.92 3.22   Cardiac Enzymes: No results for input(s): CKTOTAL, CKMB, CKMBINDEX, TROPONINI  in the last 168 hours. BNP (last 3 results) No results for input(s): PROBNP in the last 8760 hours. HbA1C: No results for input(s): HGBA1C in the last 72 hours. CBG:  Recent Labs Lab 12/10/16 1214 12/10/16 1612 12/10/16 2038 12/11/16 0740 12/11/16 1133  GLUCAP 199* 186* 211* 144* 146*   Lipid Profile: No results for input(s): CHOL, HDL, LDLCALC, TRIG, CHOLHDL, LDLDIRECT in the last 72 hours. Thyroid Function Tests: No results for input(s): TSH, T4TOTAL, FREET4, T3FREE, THYROIDAB in the last 72 hours. Anemia Panel: No results for input(s): VITAMINB12, FOLATE, FERRITIN, TIBC, IRON, RETICCTPCT in the last 72 hours. Sepsis Labs: No results for input(s): PROCALCITON, LATICACIDVEN in the last 168 hours.  Recent Results (from the past 240 hour(s))  MRSA PCR Screening     Status: None   Collection Time: 12/03/16 10:41 AM  Result Value Ref Range Status   MRSA by PCR NEGATIVE NEGATIVE Final    Comment:  The GeneXpert MRSA Assay (FDA approved for NASAL specimens only), is one component of a comprehensive MRSA colonization surveillance program. It is not intended to diagnose MRSA infection nor to guide or monitor treatment for MRSA infections.          Radiology Studies: No results found.      Scheduled Meds: . ALPRAZolam  0.5 mg Oral QHS  . amiodarone  200 mg Oral BID  . Chlorhexidine Gluconate Cloth  6 each Topical Daily  . clopidogrel  75 mg Oral Daily  . darbepoetin (ARANESP) injection - NON-DIALYSIS  200 mcg Subcutaneous Q Sun-1800  . feeding supplement (GLUCERNA SHAKE)  237 mL Oral BID BM  . fluticasone  1 spray Each Nare Daily  . insulin aspart  0-5 Units Subcutaneous QHS  . insulin aspart  0-9 Units Subcutaneous TID WC  . insulin glargine  18 Units Subcutaneous QHS  . isosorbide mononitrate  30 mg Oral Daily  . latanoprost  1 drop Both Eyes QHS  . metoprolol succinate  25 mg Oral Q breakfast  . pantoprazole  40 mg Oral Daily  . polyethylene  glycol  17 g Oral BID  . rosuvastatin  10 mg Oral QHS  . sodium chloride flush  10-40 mL Intracatheter Q12H  . sodium chloride flush  3 mL Intravenous Q12H  . torsemide  80 mg Oral BID  . Warfarin - Pharmacist Dosing Inpatient   Does not apply q1800   Continuous Infusions: . sodium chloride    . ferumoxytol Stopped (12/06/16 1900)     LOS: 8 days     Georgette Shell, MD [facility]  If 7PM-7AM, please contact night-coverage [email or web address] Password [email or web address] 12/11/2016, 12:29 PM

## 2016-12-12 DIAGNOSIS — D631 Anemia in chronic kidney disease: Secondary | ICD-10-CM | POA: Diagnosis present

## 2016-12-12 DIAGNOSIS — F419 Anxiety disorder, unspecified: Secondary | ICD-10-CM | POA: Diagnosis present

## 2016-12-12 DIAGNOSIS — N3281 Overactive bladder: Secondary | ICD-10-CM | POA: Diagnosis present

## 2016-12-12 DIAGNOSIS — E1122 Type 2 diabetes mellitus with diabetic chronic kidney disease: Secondary | ICD-10-CM | POA: Diagnosis present

## 2016-12-12 DIAGNOSIS — I2510 Atherosclerotic heart disease of native coronary artery without angina pectoris: Secondary | ICD-10-CM | POA: Diagnosis present

## 2016-12-12 DIAGNOSIS — E876 Hypokalemia: Secondary | ICD-10-CM | POA: Diagnosis not present

## 2016-12-12 DIAGNOSIS — N179 Acute kidney failure, unspecified: Secondary | ICD-10-CM | POA: Diagnosis present

## 2016-12-12 DIAGNOSIS — E1151 Type 2 diabetes mellitus with diabetic peripheral angiopathy without gangrene: Secondary | ICD-10-CM | POA: Diagnosis present

## 2016-12-12 DIAGNOSIS — N183 Chronic kidney disease, stage 3 (moderate): Secondary | ICD-10-CM | POA: Diagnosis present

## 2016-12-12 DIAGNOSIS — I5043 Acute on chronic combined systolic (congestive) and diastolic (congestive) heart failure: Secondary | ICD-10-CM | POA: Diagnosis not present

## 2016-12-12 DIAGNOSIS — I480 Paroxysmal atrial fibrillation: Secondary | ICD-10-CM | POA: Diagnosis present

## 2016-12-12 DIAGNOSIS — I6523 Occlusion and stenosis of bilateral carotid arteries: Secondary | ICD-10-CM | POA: Diagnosis present

## 2016-12-12 DIAGNOSIS — J9601 Acute respiratory failure with hypoxia: Secondary | ICD-10-CM | POA: Diagnosis present

## 2016-12-12 DIAGNOSIS — R748 Abnormal levels of other serum enzymes: Secondary | ICD-10-CM | POA: Diagnosis not present

## 2016-12-12 DIAGNOSIS — N170 Acute kidney failure with tubular necrosis: Secondary | ICD-10-CM | POA: Diagnosis not present

## 2016-12-12 DIAGNOSIS — E669 Obesity, unspecified: Secondary | ICD-10-CM | POA: Diagnosis present

## 2016-12-12 DIAGNOSIS — E785 Hyperlipidemia, unspecified: Secondary | ICD-10-CM | POA: Diagnosis present

## 2016-12-12 DIAGNOSIS — N184 Chronic kidney disease, stage 4 (severe): Secondary | ICD-10-CM | POA: Diagnosis not present

## 2016-12-12 DIAGNOSIS — I214 Non-ST elevation (NSTEMI) myocardial infarction: Secondary | ICD-10-CM | POA: Diagnosis present

## 2016-12-12 DIAGNOSIS — K219 Gastro-esophageal reflux disease without esophagitis: Secondary | ICD-10-CM | POA: Diagnosis present

## 2016-12-12 DIAGNOSIS — I130 Hypertensive heart and chronic kidney disease with heart failure and stage 1 through stage 4 chronic kidney disease, or unspecified chronic kidney disease: Secondary | ICD-10-CM | POA: Diagnosis present

## 2016-12-12 LAB — RENAL FUNCTION PANEL
Albumin: 2.3 g/dL — ABNORMAL LOW (ref 3.5–5.0)
Anion gap: 11 (ref 5–15)
BUN: 55 mg/dL — ABNORMAL HIGH (ref 6–20)
CALCIUM: 8.5 mg/dL — AB (ref 8.9–10.3)
CO2: 31 mmol/L (ref 22–32)
CREATININE: 3.17 mg/dL — AB (ref 0.44–1.00)
Chloride: 94 mmol/L — ABNORMAL LOW (ref 101–111)
GFR calc Af Amer: 16 mL/min — ABNORMAL LOW (ref >60)
GFR calc non Af Amer: 14 mL/min — ABNORMAL LOW (ref >60)
GLUCOSE: 195 mg/dL — AB (ref 65–99)
Phosphorus: 3.6 mg/dL (ref 2.5–4.6)
Potassium: 4 mmol/L (ref 3.5–5.1)
SODIUM: 136 mmol/L (ref 135–145)

## 2016-12-12 LAB — PROTIME-INR
INR: 2.14
PROTHROMBIN TIME: 23.7 s — AB (ref 11.4–15.2)

## 2016-12-12 LAB — CBC
HCT: 26.4 % — ABNORMAL LOW (ref 36.0–46.0)
Hemoglobin: 7.9 g/dL — ABNORMAL LOW (ref 12.0–15.0)
MCH: 28.9 pg (ref 26.0–34.0)
MCHC: 29.9 g/dL — AB (ref 30.0–36.0)
MCV: 96.7 fL (ref 78.0–100.0)
PLATELETS: 330 10*3/uL (ref 150–400)
RBC: 2.73 MIL/uL — ABNORMAL LOW (ref 3.87–5.11)
RDW: 18.4 % — ABNORMAL HIGH (ref 11.5–15.5)
WBC: 5.3 10*3/uL (ref 4.0–10.5)

## 2016-12-12 LAB — GLUCOSE, CAPILLARY
GLUCOSE-CAPILLARY: 167 mg/dL — AB (ref 65–99)
Glucose-Capillary: 214 mg/dL — ABNORMAL HIGH (ref 65–99)
Glucose-Capillary: 281 mg/dL — ABNORMAL HIGH (ref 65–99)

## 2016-12-12 MED ORDER — AMIODARONE HCL 200 MG PO TABS: 200.0000 mg | ORAL_TABLET | Freq: Two times a day (BID) | ORAL | 0 refills | Status: DC

## 2016-12-12 MED ORDER — WARFARIN SODIUM 7.5 MG PO TABS: 7.5000 mg | ORAL_TABLET | Freq: Once | ORAL | Status: DC

## 2016-12-12 MED ORDER — CLOPIDOGREL BISULFATE 75 MG PO TABS: 75.0000 mg | ORAL_TABLET | Freq: Every day | ORAL | 0 refills | Status: AC

## 2016-12-12 NOTE — Progress Notes (Signed)
PT Cancellation Note  Patient Details Name: [name] MRN: [id number] DOB: 1946-03-02   Cancelled Treatment:    Reason Eval/Treat Not Completed: Patient declined, no reason specified Patient requested PT return later due to recently being up with OT. PT will check on pt later as time allows.    [name], PTA Pager: [phone]   12/12/2016, 8:49 AM

## 2016-12-12 NOTE — Progress Notes (Signed)
Progress Note  Patient Name: [name] Date of Encounter: 12/12/2016  Primary Cardiologist: Bronson Ing  Subjective   She is feeling better, SOB has improved, she has been sleeping in chair this am, hasn't walked yet.  Inpatient Medications    Scheduled Meds: . ALPRAZolam  0.5 mg Oral QHS  . amiodarone  200 mg Oral BID  . Chlorhexidine Gluconate Cloth  6 each Topical Daily  . clopidogrel  75 mg Oral Daily  . darbepoetin (ARANESP) injection - NON-DIALYSIS  200 mcg Subcutaneous Q Sun-1800  . feeding supplement (GLUCERNA SHAKE)  237 mL Oral BID BM  . fluticasone  1 spray Each Nare Daily  . insulin aspart  0-5 Units Subcutaneous QHS  . insulin aspart  0-9 Units Subcutaneous TID WC  . insulin glargine  18 Units Subcutaneous QHS  . isosorbide mononitrate  30 mg Oral Daily  . latanoprost  1 drop Both Eyes QHS  . metoprolol succinate  25 mg Oral Q breakfast  . pantoprazole  40 mg Oral Daily  . polyethylene glycol  17 g Oral BID  . rosuvastatin  10 mg Oral QHS  . sodium chloride flush  10-40 mL Intracatheter Q12H  . sodium chloride flush  3 mL Intravenous Q12H  . torsemide  80 mg Oral BID  . warfarin  7.5 mg Oral ONCE-1800  . Warfarin - Pharmacist Dosing Inpatient   Does not apply q1800   Continuous Infusions: . sodium chloride    . ferumoxytol Stopped (12/06/16 1900)   PRN Meds: sodium chloride, acetaminophen, albuterol, guaiFENesin-dextromethorphan, ondansetron (ZOFRAN) IV, sodium chloride flush, sodium chloride flush   Vital Signs    Vitals:   12/11/16 1135 12/11/16 2040 12/12/16 0403 12/12/16 0655  BP: (!) 119/54 (!) 127/53  131/65  Pulse: 75 (!) 58  66  Resp: 18 18  18   Temp: 98 F (36.7 C) 98.6 F (37 C)  98.8 F (37.1 C)  TempSrc: Oral Oral  Oral  SpO2: 100% 100%  97%  Weight:   203 lb 8 oz (92.3 kg)   Height:        Intake/Output Summary (Last 24 hours) at 12/12/16 0916 Last data filed at 12/12/16 0300  Gross per 24 hour  Intake               960 ml  Output              150 ml  Net              810 ml   Filed Weights   12/10/16 0500 12/11/16 0428 12/12/16 0403  Weight: 195 lb 1.6 oz (88.5 kg) 202 lb 3.2 oz (91.7 kg) 203 lb 8 oz (92.3 kg)    Telemetry    NSR - Personally Reviewed  ECG    12/04/16 SR poor R wave progression no acute changes   Physical Exam  Affect appropriate Elderly black female  HEENT: normal Neck supple with no adenopathy JVP normal no bruits no thyromegaly Lungs clear with no wheezing and good diaphragmatic motion Heart:  S1/S2 no murmur, no rub, gallop or click PMI normal Abdomen: benighn, BS positive, no tenderness, no AAA no bruit.  No HSM or HJR Distal pulses intact with no bruits 2+ pitting bilateral edema Neuro non-focal Skin warm and dry No muscular weakness  Labs    Chemistry  Recent Labs Lab 12/10/16 0435 12/11/16 0312 12/12/16 0403  NA 137 136 136  K 3.8 3.5 4.0  CL 93* 93*  94*  CO2 33* 32 31  GLUCOSE 241* 183* 195*  BUN 60* 57* 55*  CREATININE 3.25* 3.42* 3.17*  CALCIUM 8.8* 8.7* 8.5*  ALBUMIN 2.4* 2.2* 2.3*  GFRNONAA 13* 12* 14*  GFRAA 15* 14* 16*  ANIONGAP 11 11 11      Hematology  Recent Labs Lab 12/07/16 0500 12/09/16 0519 12/12/16 0403  WBC 5.6 6.6 5.3  RBC 2.69* 2.78* 2.73*  HGB 8.1* 7.9* 7.9*  HCT 24.8* 26.4* 26.4*  MCV 92.2 95.0 96.7  MCH 30.1 28.4 28.9  MCHC 32.7 29.9* 29.9*  RDW 17.8* 18.0* 18.4*  PLT 294 302 330    Cardiac Enzymes No results for input(s): TROPONINI in the last 168 hours. No results for input(s): TROPIPOC in the last 168 hours.   BNP No results for input(s): BNP, PROBNP in the last 168 hours.   DDimer No results for input(s): DDIMER in the last 168 hours.   Radiology    No results found.  Cardiac Studies   11/2016 nuclear stress: large inferolateralscar, LVEF 22%.  11/2015 cath: patent RCA stents, mod LAD disease, LCX CTO. Recs for medical thearpy.  11/2016 LVEF 30-35%, grade II diastolic dysfunciton  Patient  Profile     71 y.o. female with CHF due to CAD (NSTEMI 06/2015 with DES to PDA and RCA at that time, subtotal occlusion of LCX treated medically; repeat cath 9/2017with stable disease; inferolateral scar w/o ischemia),LVEF 30-35%, grade II diastolic dysfunction, paroxysmal atrial fibrillation, right subclavian stenosis s/p intervention, DM2, HTN, HL, CKD IV admitted with SOB and fluid overload (initially to [facility]).   Assessment & Plan     1. CHF: her weight is increasing on home dose of Torsemide, now at 203 lbs, I will increase torsemide to 100 mg po BID. Alternatively we could try Metolazone 2.5 mg po 3x/week. 2. CAD: angina free,low risk myovue renal failure precludes cath medical Rx Brillinta changed to Plavix given need for anticoagulation  3. PAFib: controlled on amiodarone. Reduce . Reduce the dose of amiodarone to maintenance 200 mg daily next week. 4. Warfarin: INR therapeutic level 5. Ac on chr renal failure: per renal stable despite ongoing diuresis .  The patient would like to go to [facility] in [place] for a rehab. We will try to arrange.   For questions or updates, please contact [place] Please consult [email or web address] for contact info under Cardiology/STEMI.      Signed, Ena Dawley, MD  12/12/2016, 9:16 AM

## 2016-12-12 NOTE — Progress Notes (Signed)
[place] for warfarin Indication: atrial fibrillation  Allergies  Allergen Reactions  . Ace Inhibitors Cough  . Amlodipine Other (See Comments)    Edema   . Codeine Rash    Patient Measurements: Height: 5\' 8"  (172.7 cm) Weight: 203 lb 8 oz (92.3 kg) IBW/kg (Calculated) : 63.9  Assessment: 71 YOF on Coumadin PTA for Afib per records from [place] = 5. She is noted on amiodarone - new start this admit (had RX but not started PTA). INR was elevated on admit and is at  goal today (likely in setting of new amiodarone and HF). Would anticipate at least a 30% reduction in home dose with addition of amiodarone.  -INR= 2.14  Home dose: alternating 7.5mg  and 3.75mg  every other day  Goal of Therapy:  INR 2-3 Monitor platelets by anticoagulation protocol: Yes   Plan:  Warfarin 7.5 mg po x 1 today Daily PT/INR  Thank you Anette Guarneri, PharmD [phone] 12/12/2016 8:37 AM

## 2016-12-12 NOTE — Progress Notes (Signed)
Occupational Therapy Treatment Patient Details Name: [name] MRN: [id number] DOB: 15-Oct-1945 Today's Date: 12/12/2016    History of present illness Patient is a 71 y/o female who presents with worsening SOB. Admitted with acute on chronic heart failure and acute on chronic kidney disease. CXR-Persistent moderate right and small left pleural effusions. Recent hospital admission at [facility]. 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   OT comments  Pt seen for ADL retraining session with focus on functional mobility/transfers, grooming sitting at sink and activity tolerance/endurance. Pt is overall Min-Mod A. Pt plans to d/c to SNF for short term Rehab if possible prior to returning home. Will cont acute OT.    Follow Up Recommendations  SNF;Supervision/Assistance - 24 hour (To maximize initial therapy and for safety)    Equipment Recommendations  3 in 1 bedside commode    Recommendations for Other Services      Precautions / Restrictions Precautions Precautions: Fall Restrictions Weight Bearing Restrictions: No       Mobility Bed Mobility Overal bed mobility:  (Pt sitting up in chair upon OT arrival)                Transfers Overall transfer level: Needs assistance Equipment used: Rolling walker (2 wheeled) Transfers: Sit to/from [facility] Sit to Stand: Min assist Stand pivot transfers: Min assist       General transfer comment: Assist for power up and for balance/stability; VC's for safety when transferring from stand to sit (hand placement).    Balance Overall balance assessment: Needs assistance Sitting-balance support: No upper extremity supported;Feet supported Sitting balance-[name]: Good Sitting balance - Comments: at edge of chair   Standing balance support: Bilateral upper extremity supported Standing balance-[name]:  Fair Standing balance comment: walker and min assist for static standing                           ADL either performed or assessed with clinical judgement   ADL Overall ADL's : Needs assistance/impaired Eating/Feeding: Modified independent;Sitting Eating/Feeding Details (indicate cue type and reason): Pt able to organize and arrange breakfast tray w/o assistance. Eating Mod I. Grooming: Wash/dry hands;Wash/dry face;Oral care;Set up;Supervision/safety;Sitting Grooming Details (indicate cue type and reason): Sitting at sink for grooming tasks this morning Upper Body Bathing:  (Pt declined stating that she would like to eat breakfast first)       Upper Body Dressing :  (Pt declined changing gown and bathing UB as she'd like to eat breakfast first)       Toilet Transfer: Minimal assistance;Stand-pivot;BSC;RW;Ambulation (Simulated tansfer after ambulating to sink for grooming. Pt stated that she had already used 3:1 this morning.) Toilet Transfer Details (indicate cue type and reason): VC's for safety and sequencing - reach for armrests on 3:1 and Chair as pt is inclined to sit uncontrolled while holding onto RW. Toileting- Clothing Manipulation and Hygiene: Minimal assistance;Sit to/from stand;Moderate assistance       Functional mobility during ADLs: Minimal assistance;Cueing for safety;Rolling walker General ADL Comments: Pt participated in ADL Retraining session today with focus on functional ambulation/mobility, transfer from chair, ambulating in room to sink for grooming while seated. Simulated toilet transfer and back to recliner chair for breakfast. Pt reports that she is feeling a tiny bit stronger each day & is hopeful to get into SNF Rehab near [place] prior to to d/c home as her  husband was recently in poor health as well.      Vision Patient Visual Report: No change from baseline     Perception     Praxis      Cognition Arousal/Alertness:  Awake/alert Behavior During Therapy: WFL for tasks assessed/performed Overall Cognitive Status: Within Functional Limits for tasks assessed                                          Exercises     Shoulder Instructions       General Comments      Pertinent Vitals/ Pain       Pain Assessment: No/denies pain  Home Living                                          Prior Functioning/Environment              Frequency  Min 2X/week        Progress Toward Goals  OT Goals(current goals can now be found in the care plan section)  Progress towards OT goals: Progressing toward goals  Acute Rehab OT Goals Patient Stated Goal: Go to Rehab then home if possible  Plan Discharge plan remains appropriate;Frequency remains appropriate    Co-evaluation                 AM-PAC PT "6 Clicks" Daily Activity     Outcome Measure   Help from another person eating meals?: None Help from another person taking care of personal grooming?: A Little Help from another person toileting, which includes using toliet, bedpan, or urinal?: A Little Help from another person bathing (including washing, rinsing, drying)?: A Lot Help from another person to put on and taking off regular upper body clothing?: A Little Help from another person to put on and taking off regular lower body clothing?: A Little 6 Click Score: 18    End of Session Equipment Utilized During Treatment: Rolling walker;Gait belt  OT Visit Diagnosis: Unsteadiness on feet (R26.81)   Activity Tolerance Patient tolerated treatment well   Patient Left in chair;with call bell/phone within reach;with nursing/sitter in room   Nurse Communication          Time: 9937-1696 OT Time Calculation (min): 25 min  Charges: OT General Charges $OT Visit: 1 Visit OT Treatments $Self Care/Home Management : 23-37 mins    [place], Amy Beth Dixon, OTR/L 12/12/2016, 8:35 AM

## 2016-12-12 NOTE — Progress Notes (Signed)
Patient has order to discharge home. Pt was upset that "no one told me this." Patient upset that her home and family will not be ready. Attending MD notified, but patient states she did not want to speak to MD.   Patient was reassured and family was called to assist with transfer to home. IV's and tele removed. Discharge instructions given. Pt stated she did not have a prescription for amiodarone. MD notified. Patient then states she does not want a prescription from hospital MD and will receive prescription from her primary MD. Patient is stable and awaiting for transportation by family.

## 2016-12-12 NOTE — Care Management Note (Signed)
Case Management Note  Patient Details  Name: [name] MRN: [id number] Date of Birth: 10/04/1945  Subjective/Objective:   CHF                Action/Plan: Patient lives at home with her daughter, drives a Comptroller part time; PCP: [name], MD; has private insurance with [employer] with prescription drug coverage; patient stated that she want to go home at discharge with [facility] service provided by [facility] as prior to admission. No DME needed at this time.  Expected Discharge Date:   possibly 12/13/2016               Expected Discharge Plan:  [place]  In-House Referral:   [facility]  Discharge planning Services  CM Consult  Post Acute Care Choice:  Home Health, Resumption of Svcs/PTA Provider Choice offered to:     HH Arranged:  RN, PT [facility] Agency:  [facility]  Status of Service:  In process, will continue to follow  Royston Bake, [place] ,Leory Plowman [phone] 12/12/2016, 2:12 PM

## 2016-12-12 NOTE — Progress Notes (Signed)
Inpatient Diabetes Program Recommendations  AACE/ADA: New Consensus Statement on Inpatient Glycemic Control (2015)  Target Ranges:  Prepandial:   less than 140 mg/dL      Peak postprandial:   less than 180 mg/dL (1-2 hours)      Critically ill patients:  140 - 180 mg/dL   Results for [name], [name] (MRN [id number]) as of 12/12/2016 10:01  Ref. Range 12/11/2016 07:40 12/11/2016 11:33 12/11/2016 16:34 12/11/2016 21:43 12/12/2016 07:40  Glucose-Capillary Latest Ref Range: 65 - 99 mg/dL 144 (H) 146 (H) 299 (H) 356 (H) 167 (H)   Review of Glycemic Control  Diabetes history: DM2 Outpatient Diabetes medications:  Lantus 30 units QHS, Humalog 15-21 units TID with meals Current orders for Inpatient glycemic control: Lantus 18 units QHS, Novolog 0-9 units TID with meals, Novolog 0-5 units QHS  Inpatient Diabetes Program Recommendations: Insulin - Meal Coverage: Please consider ordering Novolog 3 units TID with meals for meal coverage if patient is eating at least 50% of meals.  Thanks, Barnie Alderman, RN, MSN, CDE Diabetes Coordinator Inpatient Diabetes Program [phone] (Team Pager from 8am to 5pm)

## 2016-12-12 NOTE — Progress Notes (Addendum)
Physical Therapy Treatment Patient Details Name: [name] MRN: [id number] DOB: 10-Nov-1945 Today's Date: 12/12/2016    History of Present Illness Patient is a 71 y/o female who presents with worsening SOB. Admitted with acute on chronic heart failure and acute on chronic kidney disease. CXR-Persistent moderate right and small left pleural effusions. Recent hospital admission at [facility]. 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    PT Comments    Patient agreeable to short distance gait and requested to try ambulating with rollator. Pt required min A for functional transfers and safety with ambulation. Pt SOB with SpO2 95% or > on RA. Pt will continue to benefit from further skilled PT services both acute and post acute to maximize independence and safety with mobility.   Follow Up Recommendations  SNF     Equipment Recommendations  RW (if pt does not have one; reported using daughter's sometimes who is an amputee)    Recommendations for Other Services OT consult     Precautions / Restrictions Precautions Precautions: Fall Restrictions Weight Bearing Restrictions: No    Mobility  Bed Mobility               General bed mobility comments: pt OOB in chair upon arrival  Transfers Overall transfer level: Needs assistance Equipment used:  (rollator) Transfers: Sit to/from Stand Sit to Stand: Min assist         General transfer comment: assist to power up into standing and for balance when transitioning hand placement to walker; cues for safety  Ambulation/Gait Ambulation/Gait assistance: Min assist Ambulation Distance (Feet): 30 Feet Assistive device:  (rollator) Gait Pattern/deviations: Step-through pattern;Decreased step length - right;Decreased step length - left;Shuffle;Trunk flexed Gait velocity: decr   General Gait Details: multimodal cues for posture  and vc for increased bilat step lengths and bilat heel strike   Stairs            Wheelchair Mobility    Modified Rankin (Stroke Patients Only)       Balance Overall balance assessment: Needs assistance Sitting-balance support: No upper extremity supported;Feet supported Sitting balance-[name]: Good     Standing balance support: Bilateral upper extremity supported Standing balance-[name]: Fair                              Cognition Arousal/Alertness: Awake/alert Behavior During Therapy: WFL for tasks assessed/performed Overall Cognitive Status: No family/caregiver present to determine baseline cognitive functioning                                 General Comments: pt reported using SPC PTA on eval. during today's session pt reported she did not use RW at home when asked than later when therapist mentioned using cane she reported that she uses RW but that it's her daughter's       Exercises      General Comments General comments (skin integrity, edema, etc.): SOB with short distance gait; SpO2 95% or > on RA      Pertinent Vitals/Pain Pain Assessment: No/denies pain    Home Living                      Prior Function            PT Goals (current goals can now  be found in the care plan section) Acute Rehab PT Goals Patient Stated Goal: Go to Rehab then home if possible PT Goal Formulation: With patient Time For Goal Achievement: 12/20/16 Potential to Achieve Goals: Fair Progress towards PT goals: Progressing toward goals    Frequency    Min 3X/week      PT Plan Current plan remains appropriate    Co-evaluation              AM-PAC PT "6 Clicks" Daily Activity  Outcome Measure  Difficulty turning over in bed (including adjusting bedclothes, sheets and blankets)?: A Little Difficulty moving from lying on back to sitting on the side of the bed? : Unable Difficulty sitting down on and standing up  from a chair with arms (e.g., wheelchair, bedside commode, etc,.)?: Unable Help needed moving to and from a bed to chair (including a wheelchair)?: A Little Help needed walking in hospital room?: A Little Help needed climbing 3-5 steps with a railing? : Total 6 Click Score: 12    End of Session Equipment Utilized During Treatment: Gait belt Activity Tolerance: Patient limited by fatigue Patient left: in chair;with call bell/phone within reach Nurse Communication: Mobility status PT Visit Diagnosis: Unsteadiness on feet (R26.81);Other abnormalities of gait and mobility (R26.89);Muscle weakness (generalized) (M62.81)     Time: 1345-1410 PT Time Calculation (min) (ACUTE ONLY): 25 min  Charges:  $Gait Training: 8-22 mins $Therapeutic Activity: 8-22 mins                    G Codes:       Earney Navy, PTA Pager: [phone]     Darliss Cheney 12/12/2016, 2:34 PM

## 2016-12-12 NOTE — Progress Notes (Signed)
CSW received call from physician to speak with pt regarding barriers to [facility].  CSW met with pt and explained that [facility] did not offer a bed to her because they are out of network with her insurance so patient would have to private stay for stay there.  CSW spoke with pt again about alternative options and pt remains adamant that she would not want to go to the other SNF options available to her.  Pt would like to go home and resume PT at home- inquired about aids- CSW informed RNCM who will follow up with pt about what home services are available  CSW signing off  Jenna H. Uris, LCSW Clinical Social Worker [phone]  

## 2016-12-12 NOTE — Progress Notes (Signed)
[facility] ROUNDING NOTE   Subjective:   Interval History: 71 y.o.femalewith history of CAD status post non-STEMI in 2017-status post PCI, chronic systolic/diastolic heart failure, right subclavian stenosis status post thrombectomy in 2017 and July 2018 admitted with decompensated heart failure and worsening renal function. Was converted to oral torsemide   Reasonable diuresis noted although no output recorded for 10/7   Objective:  Vital signs in last 24 hours:  Temp:  [98 F (36.7 C)-98.8 F (37.1 C)] 98.8 F (37.1 C) (10/08 0655) Pulse Rate:  [58-75] 66 (10/08 0655) Resp:  [18] 18 (10/08 0655) BP: (119-131)/(53-65) 131/65 (10/08 0655) SpO2:  [97 %-100 %] 97 % (10/08 0655) Weight:  [203 lb 8 oz (92.3 kg)] 203 lb 8 oz (92.3 kg) (10/08 0403)  Weight change: 1 lb 4.8 oz (0.59 kg) Filed Weights   12/10/16 0500 12/11/16 0428 12/12/16 0403  Weight: 195 lb 1.6 oz (88.5 kg) 202 lb 3.2 oz (91.7 kg) 203 lb 8 oz (92.3 kg)    Intake/Output: I/O last 3 completed shifts: In: 1200 [P.O.:1200] Out: 150 [Urine:150]   Intake/Output this shift:  No intake/output data recorded.  CVS- RRR RS- CTA ABD- BS present soft non-distended EXT- 1 +  edema   Basic Metabolic Panel:  Recent Labs Lab 12/06/16 0335 12/07/16 0500 12/08/16 0608 12/09/16 0508 12/10/16 0435 12/11/16 0312 12/12/16 0403  NA 130* 134* 136 137 137 136 136  K 4.1 3.8 3.2* 3.7 3.8 3.5 4.0  CL 92* 94* 94* 94* 93* 93* 94*  CO2 27 28 30 31  33* 32 31  GLUCOSE 184* 124* 171* 146* 241* 183* 195*  BUN 96* 88* 77* 66* 60* 57* 55*  CREATININE 4.65* 4.21* 3.79* 3.51* 3.25* 3.42* 3.17*  CALCIUM 8.4* 8.5* 8.5* 8.8* 8.8* 8.7* 8.5*  MG 2.7* 2.6*  --   --   --   --   --   PHOS 5.7* 5.3* 4.5 3.9 3.8 3.8 3.6    Liver Function Tests:  Recent Labs Lab 12/08/16 0608 12/09/16 0508 12/10/16 0435 12/11/16 0312 12/12/16 0403  ALBUMIN 2.3* 2.4* 2.4* 2.2* 2.3*   No results for input(s): LIPASE, AMYLASE in the  last 168 hours. No results for input(s): AMMONIA in the last 168 hours.  CBC:  Recent Labs Lab 12/06/16 0335 12/07/16 0500 12/09/16 0519 12/12/16 0403  WBC 5.7 5.6 6.6 5.3  NEUTROABS 4.4 4.2  --   --   HGB 7.8* 8.1* 7.9* 7.9*  HCT 25.5* 24.8* 26.4* 26.4*  MCV 93.1 92.2 95.0 96.7  PLT 294 294 302 330    Cardiac Enzymes: No results for input(s): CKTOTAL, CKMB, CKMBINDEX, TROPONINI in the last 168 hours.  BNP: Invalid input(s): POCBNP  CBG:  Recent Labs Lab 12/11/16 0740 12/11/16 1133 12/11/16 1634 12/11/16 2143 12/12/16 0740  GLUCAP 144* 146* 299* 356* 167*    Microbiology: Results for orders placed or performed during the hospital encounter of 12/03/16  MRSA PCR Screening     Status: None   Collection Time: 12/03/16 10:41 AM  Result Value Ref Range Status   MRSA by PCR NEGATIVE NEGATIVE Final    Comment:        The GeneXpert MRSA Assay (FDA approved for NASAL specimens only), is one component of a comprehensive MRSA colonization surveillance program. It is not intended to diagnose MRSA infection nor to guide or monitor treatment for MRSA infections.     Coagulation Studies:  Recent Labs  12/10/16 0435 12/11/16 0312 12/12/16 0403  LABPROT 30.2*  32.7* 23.7*  INR 2.92 3.22 2.14    Urinalysis: No results for input(s): COLORURINE, LABSPEC, PHURINE, GLUCOSEU, HGBUR, BILIRUBINUR, KETONESUR, PROTEINUR, UROBILINOGEN, NITRITE, LEUKOCYTESUR in the last 72 hours.  Invalid input(s): APPERANCEUR    Imaging: No results found.   Medications:   . sodium chloride    . ferumoxytol Stopped (12/06/16 1900)   . ALPRAZolam  0.5 mg Oral QHS  . amiodarone  200 mg Oral BID  . Chlorhexidine Gluconate Cloth  6 each Topical Daily  . clopidogrel  75 mg Oral Daily  . darbepoetin (ARANESP) injection - NON-DIALYSIS  200 mcg Subcutaneous Q Sun-1800  . feeding supplement (GLUCERNA SHAKE)  237 mL Oral BID BM  . fluticasone  1 spray Each Nare Daily  . insulin aspart   0-5 Units Subcutaneous QHS  . insulin aspart  0-9 Units Subcutaneous TID WC  . insulin glargine  18 Units Subcutaneous QHS  . isosorbide mononitrate  30 mg Oral Daily  . latanoprost  1 drop Both Eyes QHS  . metoprolol succinate  25 mg Oral Q breakfast  . pantoprazole  40 mg Oral Daily  . polyethylene glycol  17 g Oral BID  . rosuvastatin  10 mg Oral QHS  . sodium chloride flush  10-40 mL Intracatheter Q12H  . sodium chloride flush  3 mL Intravenous Q12H  . torsemide  80 mg Oral BID  . warfarin  7.5 mg Oral ONCE-1800  . Warfarin - Pharmacist Dosing Inpatient   Does not apply q1800   sodium chloride, acetaminophen, albuterol, guaiFENesin-dextromethorphan, ondansetron (ZOFRAN) IV, sodium chloride flush, sodium chloride flush  Assessment/ Plan:  1. 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 DM, HTN, OAB issues.  1. BL creatinine usual mid to low 2's  Now increased to 3  -- possible new baseline  -- followed by Dr Lorrene Reid  2. AKI on CKD in setting of AFib, large doses toprol (dose reduced now), low HR (improved) and low BP. I believe mostly hemodynamically driven.  3. Diuresed well with IV lasix, switched to po torsemide yesterday 10/6 (80 BID was last home dose) and appears good UOP on that so far. Still very overloaded on exam in terms of peripheral edema and is symptomatic still breathing wise. Will continue oral torsemide for now 4. Creatinine may be at a new baseline mid 3's 5. Will need to f/u in the office withDr Lorrene Reid at discharge  2. Anemia - Prior good outpt Hb's. Has required Feraheme in the past, never Aranesp. Started that here. Darbepoetin 100 mcg/week (1st dosed 9/30). ^'d to 200 for next dose. Fe quite low - Feraheme dosed 10/2. Next dose will be 10/9.  3. CAD - + trops falling. Several prior interventions. No plans for cath so far this trip. Risk outweighs benefit per cards.  4. DM - per primary service 5. Supratherapeutic INR - pharmacy monitoring. 6. Hypokalemia - KDur 30 BID X 2 doses 10/4.   Stable  now     LOS: 9 [name] @TODAY @10 :36 AM

## 2016-12-12 NOTE — Discharge Summary (Signed)
Physician Discharge Summary  [name] VOZ:[id number] DOB: 1945/07/16 DOA: 12/03/2016  PCP: [name], MD  Admit date: 12/03/2016 Discharge date: 12/12/2016  Admitted From HOME Disposition:HOME  Recommendations for Outpatient Follow-up:  1. Follow up with PCP in 1-2 weeks 2. Please obtain BMP/CBC in one week  Home Health:YES Equipment/Devices:NONE  Discharge Condition:STABLE CODE STATUS:FULL Diet recommendation:  CARDIAC  Brief/Interim QQVZDGL:71 y.o.femalewith history of CAD status post non-STEMI in 2017-status post PCI, chronic systolic/diastolic heart failure, right subclavian stenosis status post thrombectomy in 2017 and July 2018 admitted with decompensated heart failure and worsening renal function. Patient was initially treated with iv lasix and switched to demadex.her new baseline creatinine is in mid 3.patient was told to go to SNF.but she wanted to go only to the [facility] which was out of network for her insurance.  Discharge Diagnoses:  Principal Problem:   Acute on chronic combined systolic and diastolic CHF (congestive heart failure) (HCC) Active Problems:   Essential hypertension   Coronary artery disease   GERD   Elevated troponin   Acute renal failure superimposed on chronic kidney disease (HCC)   Acute respiratory failure with hypoxia (HCC)   DM type 2 causing vascular disease (HCC)   Pleural effusion on right   Anemia in chronic kidney disease   Atrial fibrillation (HCC)   Ischemic cardiomyopathy   Hypercoagulopathy (HCC)   Goals of care, counseling/discussion   Palliative care encounter  Acute on chronic combined systolic and diastolic CHF (EF 56-43% on 11/15/16): On  demadex 80 mg bid. appreciate nephrology input.she actually gained weight it looks like..88.5 to 91.7 kg.  Acute on chronic kidney disease stage PIR:[id number] mediated, creatinine continues to downtrend with supportive care. Continue to follow electrolytes.  Appreciate nephrology input.continue demadex.cr 3.25.demadex started yesterday.  Hypokalemia:Repleted-continue to follow periodically.  Acute respiratory failure with hypoxia:Resolved-now on room air, likely secondary to decompensated heart failure.   Paroxysmal Atrial fibrillation:Rate controlled-continue with beta blocker and amiodarone. Chads2vascof around 5. INR therapeutic-Coumadin per pharmacy.   Non-STEMI: troponin significantly elevated-a recent nuc stress test on 9/11-without ischemia, has known hx of CAD-remains on Metoprolol,statin. Cards following, per cardiology-no plans for invasive procedures at this time.   History of CAD status post PCI in 2017: see above-has hx of PCI in April 2017 following NSTEMI  History of pleural effusion: Reviewed discharge summary on 9/23-apparently patient had undergone a recent thoracocentesis-pleural effusion was thought to be secondary to transudate. Currently hypoxia is improved, volume status is improving with diuretics-do not see any indication for thoracocentesis at this time. Pleural effusion is likely related to CHF.  Insulin-dependent DM-2:CBG on the higher side-increase Lantus to 18 units, continue SSI and follow    Discharge Instructions follow up with pcp,nephrology   Allergies as of 12/12/2016      Reactions   Ace Inhibitors Cough   Amlodipine Other (See Comments)   Edema   Codeine Rash      Medication List    STOP taking these medications   acetaminophen 500 MG tablet Commonly known as:  TYLENOL   cefUROXime 500 MG tablet Commonly known as:  CEFTIN   multivitamin with minerals Tabs tablet   potassium chloride SA 20 MEQ tablet Commonly known as:  K-DUR,KLOR-CON   ticagrelor 60 MG Tabs tablet Commonly known as:  BRILINTA   trimethoprim 100 MG tablet Commonly known as:  TRIMPEX   trolamine salicylate 10 % cream Commonly known as:  ASPERCREME     TAKE these medications   albuterol 108 (90  Base)  MCG/ACT inhaler Commonly known as:  PROVENTIL HFA;VENTOLIN HFA Inhale 1-2 puffs into the lungs every 6 (six) hours as needed for wheezing or shortness of breath.   albuterol (2.5 MG/3ML) 0.083% nebulizer solution Commonly known as:  PROVENTIL Take 3 mLs (2.5 mg total) by nebulization every 6 (six) hours as needed for wheezing or shortness of breath.   ALPRAZolam 0.5 MG tablet Commonly known as:  XANAX Take 0.5 mg by mouth at bedtime.   amiodarone 200 MG tablet Commonly known as:  PACERONE Take 1 tablet (200 mg total) by mouth 2 (two) times daily. New medication to control your heart rate.   cetirizine 10 MG tablet Commonly known as:  ZYRTEC Take 5 mg by mouth daily.   clopidogrel 75 MG tablet Commonly known as:  PLAVIX Take 1 tablet (75 mg total) by mouth daily.   cyclobenzaprine 5 MG tablet Commonly known as:  FLEXERIL Take 5 mg by mouth at bedtime.   diazepam 5 MG tablet Commonly known as:  VALIUM Take 1 tablet (5 mg total) by mouth every 12 (twelve) hours as needed for muscle spasms.   ferrous sulfate 325 (65 FE) MG tablet Take 325 mg by mouth daily with breakfast.   fluticasone 50 MCG/ACT nasal spray Commonly known as:  FLONASE Place 1 spray into both nostrils daily.   hydrALAZINE 100 MG tablet Commonly known as:  APRESOLINE Take 0.5 tablets (50 mg total) by mouth 2 (two) times daily.   insulin glargine 100 UNIT/ML injection Commonly known as:  LANTUS Inject 0.3 mLs (30 Units total) into the skin at bedtime.   insulin lispro 100 UNIT/ML KiwkPen Commonly known as:  HUMALOG KWIKPEN INJECT 5 TO 20 TOTAL INTO THE SKIN THREE TIMES DAILY What changed:  how much to take  how to take this  when to take this  additional instructions   isosorbide mononitrate 60 MG 24 hr tablet Commonly known as:  IMDUR Take 0.5 tablets (30 mg total) by mouth daily.   latanoprost 0.005 % ophthalmic solution Commonly known as:  XALATAN Place 1 drop into both eyes at  bedtime.   meclizine 25 MG tablet Commonly known as:  ANTIVERT Take 12.5 mg by mouth 2 (two) times daily.   metoprolol succinate 100 MG 24 hr tablet Commonly known as:  TOPROL-XL Take 1 tablet (100 mg total) by mouth daily with breakfast. Take with or immediately following a meal.   nitroGLYCERIN 0.4 MG SL tablet Commonly known as:  NITROSTAT Place 1 tablet (0.4 mg total) under the tongue every 5 (five) minutes as needed for chest pain.   pantoprazole 40 MG tablet Commonly known as:  PROTONIX Take 40 mg by mouth daily.   rosuvastatin 40 MG tablet Commonly known as:  CRESTOR Take 1 tablet (40 mg total) by mouth at bedtime.   torsemide 20 MG tablet Commonly known as:  DEMADEX Take 4 tablets (80 mg total) by mouth 2 (two) times daily. Pt is able to take an additional tablet daily for weight gain greater than 3lbs. What changed:  how much to take  when to take this  additional instructions   warfarin 7.5 MG tablet Commonly known as:  COUMADIN Your dosing has been changed to one tablet alternating with a half a tablet daily. Starting on the evening of Monday 11/28/16, take 1 tablet, then on Tuesday 9/25 take a half a tablet, then follow the pattern of one tablet alternating with a half a tablet daily. What changed:  how much  to take  how to take this  when to take this  additional instructions      Follow-up Information    [facility] Follow up.   Why:  They will continue to do your home health care at your home Contact information: [place] [id number] [phone]          Allergies  Allergen Reactions  . Ace Inhibitors Cough  . Amlodipine Other (See Comments)    Edema   . Codeine Rash    Consultations:nephro,cardio   Procedures/Studies: Dg Chest 1 View  Result Date: 11/21/2016 CLINICAL DATA:  Pleural effusions, right greater than left. EXAM: CHEST 1 VIEW COMPARISON:  11/20/2016 FINDINGS: Right pleural effusion has  been eliminated. There is a moderate left effusion which appears slightly more prominent than on the prior study. Overall heart size and pulmonary vascularity are normal. No pneumothorax after thoracentesis. IMPRESSION: No pneumothorax after right thoracentesis. No visible residual right pleural fluid. Moderate left effusion appears slightly increased. Electronically Signed   By: Lorriane Shire M.D.   On: 11/21/2016 13:08   Dg Chest 2 View  Result Date: 12/07/2016 CLINICAL DATA:  71 year old female with shortness of breath. Admitted with acute on chronic heart failure. EXAM: CHEST  2 VIEW COMPARISON:  12/05/2016 and earlier. FINDINGS: AP and lateral views of the chest. Continued moderate-sized veiling opacity at both lung bases and fluid tracking in the pleural fissures, including the right minor fissure. Stable pulmonary vascularity, no overt edema. No pneumothorax. Stable cardiac size and mediastinal contours. No acute osseous abnormality identified. Paucity of bowel gas in the upper abdomen. IMPRESSION: 1. Continued moderate bilateral pleural effusions with bibasilar collapse or consolidation. 2. Stable pulmonary vascularity without overt edema. Electronically Signed   By: Genevie Ann M.D.   On: 12/07/2016 08:19   Dg Chest 2 View  Result Date: 12/05/2016 CLINICAL DATA:  71 year old female with history of dyspnea. EXAM: CHEST  2 VIEW COMPARISON:  Chest x-ray 12/03/2016. FINDINGS: Persistent moderate right and small left pleural effusions. Bibasilar opacities (right greater than left), suggest superimposed areas of subsegmental atelectasis, although underlying airspace consolidation is not entirely excluded. No evidence of pulmonary edema. Heart size is normal. Upper mediastinal contours are within normal limits. IMPRESSION: 1. Persistent moderate right and small left pleural effusions with associated areas of probable passive subsegmental atelectasis throughout the lung bases bilaterally. Electronically Signed    By: Vinnie Langton M.D.   On: 12/05/2016 07:53   Dg Chest 2 View  Result Date: 11/19/2016 CLINICAL DATA:  Short of breath. Discharged 3 days ago with congestive heart failure. EXAM: CHEST  2 VIEW COMPARISON:  11/10/2016 FINDINGS: Degraded lateral view secondary to positioning and overlying artifact. Midline trachea. Normal heart size for level of inspiration. Increase in moderate right pleural effusion. No pneumothorax. Low lung volumes. Mild pulmonary venous congestion. Worsened right and developing mild left base airspace disease. IMPRESSION: Increase in moderate right pleural effusion with adjacent atelectasis or infection. Mild pulmonary venous congestion with minimal left base atelectasis. Electronically Signed   By: Abigail Miyamoto M.D.   On: 11/19/2016 09:08   Ct Chest Wo Contrast  Result Date: 11/19/2016 CLINICAL DATA:  Shortness of breath. EXAM: CT CHEST WITHOUT CONTRAST TECHNIQUE: Multidetector CT imaging of the chest was performed following the standard protocol without IV contrast. COMPARISON:  Chest x-ray from same day. CT chest dated October 31, 2016. FINDINGS: Cardiovascular: Mild cardiomegaly, unchanged. New small pericardial effusion. Coronary, aortic arch, and branch vessel atherosclerotic  vascular disease. Mediastinum/Nodes: Prominent and mildly enlarged mediastinal lymph nodes are stable to slightly decreased in size. No axillary lymphadenopathy. The thyroid gland, trachea, and esophagus are unremarkable. Lungs/Pleura: Large right and small left pleural effusions. Right greater than left bibasilar atelectasis. Additional atelectasis in the right middle lobe. No suspicious pulmonary nodules. No consolidation or pneumothorax. Upper Abdomen: No acute abnormality. Musculoskeletal: No chest wall mass or suspicious bone lesions identified. No fracture. Unchanged degenerative changes of the thoracic spine, worst at T7-T8 and T10-T11. IMPRESSION: 1. Large right and small left pleural effusions  with adjacent right greater than left atelectasis. 2. New small pericardial effusion. 3. Stable to slightly decreased in size mildly enlarged mediastinal lymph nodes, likely reactive. 4.  Aortic atherosclerosis (ICD10-I70.0). Electronically Signed   By: Titus Dubin M.D.   On: 11/19/2016 11:16   Dg Chest Right Decubitus  Result Date: 11/19/2016 CLINICAL DATA:  Right pleural effusion. EXAM: CHEST - RIGHT DECUBITUS COMPARISON:  Chest x-ray from earlier today and CT of the chest November 19, 2016 FINDINGS: The right-side-down decubitus film demonstrates a layering right-sided effusion which correlates with comparison imaging. IMPRESSION: The right-sided pleural effusion layers on this study. Electronically Signed   By: Dorise Bullion III M.D   On: 11/19/2016 15:29   Nm Myocar Multi W/spect Tamela Oddi Motion / Ef  Result Date: 11/15/2016  There was no ST segment deviation noted during stress. T wave inversions inferiorly.  Defect 1: There is a large defect of severe severity present in the basal inferolateral, basal anterolateral, mid anterior, mid anteroseptal, mid inferolateral, mid anterolateral, apical anterior, apical inferior, apical lateral and apex location.  This is a high risk study.  Findings consistent with prior myocardial infarction. No ischemic territories.  Nuclear stress EF: 21%.    Dg Chest Port 1 View  Result Date: 12/03/2016 CLINICAL DATA:  Shortness of breath. EXAM: PORTABLE CHEST 1 VIEW COMPARISON:  12/01/2016 FINDINGS: Bilateral lower lung opacities and bilateral pleural effusions again noted. Visualized cardiomediastinal silhouette is unchanged. No pneumothorax. There has been little interval change since the prior study. IMPRESSION: Unchanged appearance of the chest with bilateral lower lung opacities/ atelectasis and bilateral pleural effusions. Electronically Signed   By: Margarette Canada M.D.   On: 12/03/2016 15:09   Dg Chest Port 1 View  Result Date: 11/20/2016 CLINICAL  DATA:  Shortness of breath EXAM: PORTABLE CHEST 1 VIEW COMPARISON:  11/19/2016 FINDINGS: Large right and small to moderate left pleural effusions causing hazy opacities obscuring the lung bases. Mild enlargement of the cardiopericardial silhouette. No findings of acute pulmonary edema. The right pleural effusion has a greater layering effect than on 11/19/2016, possibly due to lack of complete settling prior to imaging. IMPRESSION: 1. Similar appearance of large right and small to moderate left pleural effusions with passive atelectasis. 2. Mild enlargement of the cardiopericardial silhouette. Electronically Signed   By: Van Clines M.D.   On: 11/20/2016 12:09   US Thoracentesis Asp Pleural Space W/img Guide  Result Date: 11/21/2016 INDICATION: Right pleural effusion. EXAM: ULTRASOUND GUIDED RIGHT THORACENTESIS MEDICATIONS: None. COMPLICATIONS: None immediate. PROCEDURE: An ultrasound guided thoracentesis was thoroughly discussed with the patient and questions answered. The benefits, risks, alternatives and complications were also discussed. The patient understands and wishes to proceed with the procedure. Written consent was obtained. Ultrasound was performed to localize and mark an adequate pocket of fluid in the RIGHT chest. The area was then prepped and draped in the normal sterile fashion. 1% Lidocaine was used for local anesthesia. Under  ultrasound guidance a Yueh catheter was introduced. Thoracentesis was performed. The catheter was removed and a dressing applied. FINDINGS: A total of approximately 1.7 L of dark blood-tinged fluid was removed. Samples were sent to the laboratory as requested by the clinical team. IMPRESSION: Successful ultrasound guided right thoracentesis yielding 1.7 L of pleural fluid. Electronically Signed   By: Lorriane Shire M.D.   On: 11/21/2016 13:09    (Echo, Carotid, EGD, Colonoscopy, ERCP)    Subjective:   Discharge Exam: Vitals:   12/12/16 0655 12/12/16 1201   BP: 131/65 124/66  Pulse: 66 61  Resp: 18 18  Temp: 98.8 F (37.1 C) 98 F (36.7 C)  SpO2: 97% 98%   Vitals:   12/11/16 2040 12/12/16 0403 12/12/16 0655 12/12/16 1201  BP: (!) 127/53  131/65 124/66  Pulse: (!) 58  66 61  Resp: 18  18 18   Temp: 98.6 F (37 C)  98.8 F (37.1 C) 98 F (36.7 C)  TempSrc: Oral  Oral Oral  SpO2: 100%  97% 98%  Weight:  92.3 kg (203 lb 8 oz)    Height:        General: Pt is alert, awake, not in acute distress Cardiovascular: RRR, S1/S2 +, no rubs, no gallops Respiratory: CTA bilaterally, no wheezing, no rhonchi Abdominal: Soft, NT, ND, bowel sounds + Extremities: no edema, no cyanosis    The results of significant diagnostics from this hospitalization (including imaging, microbiology, ancillary and laboratory) are listed below for reference.     Microbiology: Recent Results (from the past 240 hour(s))  MRSA PCR Screening     Status: None   Collection Time: 12/03/16 10:41 AM  Result Value Ref Range Status   MRSA by PCR NEGATIVE NEGATIVE Final    Comment:        The GeneXpert MRSA Assay (FDA approved for NASAL specimens only), is one component of a comprehensive MRSA colonization surveillance program. It is not intended to diagnose MRSA infection nor to guide or monitor treatment for MRSA infections.      Labs: BNP (last 3 results)  Recent Labs  08/07/16 0328 10/31/16 0528 12/04/16 1820  BNP 356.0* 648.0* 5,102.5*   Basic Metabolic Panel:  Recent Labs Lab 12/06/16 0335 12/07/16 0500 12/08/16 0608 12/09/16 0508 12/10/16 0435 12/11/16 0312 12/12/16 0403  NA 130* 134* 136 137 137 136 136  K 4.1 3.8 3.2* 3.7 3.8 3.5 4.0  CL 92* 94* 94* 94* 93* 93* 94*  CO2 27 28 30 31  33* 32 31  GLUCOSE 184* 124* 171* 146* 241* 183* 195*  BUN 96* 88* 77* 66* 60* 57* 55*  CREATININE 4.65* 4.21* 3.79* 3.51* 3.25* 3.42* 3.17*  CALCIUM 8.4* 8.5* 8.5* 8.8* 8.8* 8.7* 8.5*  MG 2.7* 2.6*  --   --   --   --   --   PHOS 5.7* 5.3* 4.5 3.9  3.8 3.8 3.6   Liver Function Tests:  Recent Labs Lab 12/08/16 0608 12/09/16 0508 12/10/16 0435 12/11/16 0312 12/12/16 0403  ALBUMIN 2.3* 2.4* 2.4* 2.2* 2.3*   No results for input(s): LIPASE, AMYLASE in the last 168 hours. No results for input(s): AMMONIA in the last 168 hours. CBC:  Recent Labs Lab 12/06/16 0335 12/07/16 0500 12/09/16 0519 12/12/16 0403  WBC 5.7 5.6 6.6 5.3  NEUTROABS 4.4 4.2  --   --   HGB 7.8* 8.1* 7.9* 7.9*  HCT 25.5* 24.8* 26.4* 26.4*  MCV 93.1 92.2 95.0 96.7  PLT 294 294 302 330  Cardiac Enzymes: No results for input(s): CKTOTAL, CKMB, CKMBINDEX, TROPONINI in the last 168 hours. BNP: Invalid input(s): POCBNP CBG:  Recent Labs Lab 12/11/16 1133 12/11/16 1634 12/11/16 2143 12/12/16 0740 12/12/16 1159  GLUCAP 146* 299* 356* 167* 214*   D-Dimer No results for input(s): DDIMER in the last 72 hours. Hgb A1c No results for input(s): HGBA1C in the last 72 hours. Lipid Profile No results for input(s): CHOL, HDL, LDLCALC, TRIG, CHOLHDL, LDLDIRECT in the last 72 hours. Thyroid function studies No results for input(s): TSH, T4TOTAL, T3FREE, THYROIDAB in the last 72 hours.  Invalid input(s): FREET3 Anemia work up No results for input(s): VITAMINB12, FOLATE, FERRITIN, TIBC, IRON, RETICCTPCT in the last 72 hours. Urinalysis    Component Value Date/Time   COLORURINE YELLOW 12/04/2016 1425   APPEARANCEUR TURBID (A) 12/04/2016 1425   LABSPEC 1.013 12/04/2016 1425   PHURINE 5.0 12/04/2016 1425   GLUCOSEU NEGATIVE 12/04/2016 1425   HGBUR LARGE (A) 12/04/2016 1425   BILIRUBINUR NEGATIVE 12/04/2016 1425   KETONESUR NEGATIVE 12/04/2016 1425   PROTEINUR 100 (A) 12/04/2016 1425   UROBILINOGEN 0.2 08/23/2007 2037   NITRITE NEGATIVE 12/04/2016 1425   LEUKOCYTESUR LARGE (A) 12/04/2016 1425   Sepsis Labs Invalid input(s): PROCALCITONIN,  WBC,  LACTICIDVEN Microbiology Recent Results (from the past 240 hour(s))  MRSA PCR Screening     Status:  None   Collection Time: 12/03/16 10:41 AM  Result Value Ref Range Status   MRSA by PCR NEGATIVE NEGATIVE Final    Comment:        The GeneXpert MRSA Assay (FDA approved for NASAL specimens only), is one component of a comprehensive MRSA colonization surveillance program. It is not intended to diagnose MRSA infection nor to guide or monitor treatment for MRSA infections.      Time coordinating discharge: Over 30 minutes  SIGNED:   Georgette Shell, MD  [facility] 12/12/2016, 2:18 PM  If 7PM-7AM, please contact night-coverage [email or web address] Password [email or web address]

## 2016-12-12 NOTE — Progress Notes (Signed)
With OT help  

## 2016-12-13 ENCOUNTER — Telehealth: Payer: Self-pay | Admitting: *Deleted

## 2016-12-13 ENCOUNTER — Other Ambulatory Visit: Payer: Self-pay

## 2016-12-13 DIAGNOSIS — J441 Chronic obstructive pulmonary disease with (acute) exacerbation: Secondary | ICD-10-CM

## 2016-12-13 NOTE — Progress Notes (Signed)
CSW informed by [facility] that pt is now saying she is willing to consider SNF- CSW went back over current offers and patient not agreeable to any of those options.  Pt asked about [facility] informed pt this is ALF and would not be an option  Pt now stating she is willing to go to [place] for rehab and requests [facility] in [place] spoke with admissions and they will start insurance auth- they will follow up with pt directly when they hear from insurance  CSW signing off  Jorge Ny, [place] Worker [phone]

## 2016-12-13 NOTE — Patient Outreach (Signed)
[facility] [facility]) Care Management  12/13/2016  KAISYN REINHOLD 1945/09/30 [id number]   [facility] CM called Mrs Marcon on her daughter's mobile number that was provided.   Support system Celitia confirms Mrs Bebee is at "our" home but sleeping in the den right now" when asked if Mrs Dilday was available and Mr Sigley, Mrs Zirbel and Tacy Learn live together.  Mrs Fleeman daughter confirms Mr Greis has "alzheimer's" and she has one leg amputee. She confirms Mr Bracy recently left [place] AMA "someone picked him up and the next thing I knew he was here at home and they did not know it" Mrs Rohner daughter confirms the [facility] SW was contacted 12/13/16 and provided [facility] as a [facility] for possible placement. Pending a response  Home Health Mrs Meder daughter is stating she "don't like [facility].  They told me when I called they could not come out at night if I needed them. They can not come until tomorrow." [facility] CM discussed home health agency scheduling with the daughter and 24 hour processing. [facility] CM discussed the differences in home health and personal care services. Celitia reports that [facility] was providing home health services to Mrs Dupree prior to her recent hospitalization.  [facility] Tison [facility] Celitia states Mrs Sawka has oxygen [facility] CM made Celitia aware that [facility] was called by hospital CM on this am  DME needs a w/c and rollator Presently the patient and daughter are sharing a w/c to get back and forth to the bathroom.  Mrs Brandner only used a cane when working with [facility] previously She does have access to a bedside commode Reports Daughter having difficulty with assisting and the patient's bed is "high"  Discharge [facility] CM allowed Mrs Ocheltree daughter to ventilate her feelings about Mrs Fee discharge "By chance I was going to see her and got called to say she was being discharged"  Transition of care   Left leg more  swollen than her right Has a f/u with dr hill per daughter Dr Idolina Primer was called by Mrs Ehinger daughter and pending a return call from his nurse Mrs Oyervides started on plavix-Celitia states she has all her medicines  Mrs Hoggard daughter concluded the call when Mrs Higginbotham began to call out to her. [facility] CM left her with [facility] Cm mobile number  Celitta went to check on Mrs Dimaio  [facility] CM returned a call to [place] who states that if DME called [facility] [place] or [facility] she would be able to go by and pick up DME- [facility] CM called to updated [place] and provided the fax contact for [facility] and [facility]    [facility] CM found [facility] contact information as [facility] health care service in [place], [place]  [address], [place] [address] [phone] fax [phone] [facility] CM called Dr Berdine Addison to notified of patient discharge Spoke with his nurse, Deidre Ala, [facility] CM received assistance to order for home health RN, PT and aide plus a standard w/c and walker (rollator) [place] confirmed Mrs Squyres appointment is listed for January 03 2017 but [facility] CM discussed checking for an earlier appointment related to d/c cardiac diagnoses [place] to call Mrs Heumann to possibly get her scheduled for an earlier appointment Discussed the placement concerns and minimal support system for Mrs Quinnell Updated [facility] CMA (active patient)  Continue to collaborate with [facility] SW, hospital CM and SW, pcp RN  and home health services staff  Sent letters to pcp and Mrs Steve  [facility] CM Care Plan Problem One     Most Recent Value  Care Plan Problem One  transition of care services and DME needs  Role Documenting the Problem One  Care Management Coordinator  Care Plan for Problem One  Active  [facility] Long Term Goal   over the next 45 days patient will have services and DME needs identified during transition of care assessment  [facility] Long Term Goal Start Date  12/13/16  Interventions for  Problem One Long Term Goal  Assess for transition of care needs, call pcp for orders, collaborate with [facility] SW, hospital SW, HHA, Re in force discharge instructions, Re review for more identifiable needes using transition of care  [facility] CM Short Term Goal #1   over the next 30 days patient will have services and DME needs in home  [facility] CM Short Term Goal #1 Start Date  12/13/16  Interventions for Short Term Goal #1  Assess for transition of care needs, call pcp for orders, collaborate with [facility] SW, hospital SW, HHA, Re in force discharge instructions, Re review for more identifiable needes using transition of care      [place]. Lavina Hamman, RN, BSN, [place] Care Management [phone]

## 2016-12-13 NOTE — Patient Outreach (Addendum)
[place]) Care Management  12/13/2016  TERENA BOHAN 05-Jan-1946 [id number]   Care coordination  [facility] CM received a call from Eliott Nine, [place] liaison about Mrs Isakson discharge home on 108/18.  [facility] CM had received notification on 12/12/16 and had reviewed EPIC notes Mrs Dvorsky discharged home with her daughter (amputee) 12/12/16, Mr Olden has also recently d/c from the hospital and a call was received to [facility] from Mrs Strubel since her d/c requesting placement Hospital SW involved per [place] and has spoken with Mrs Madia.  Mrs Space also has been assigned a [facility] community SW.  [facility] CM collaborated with Eliott Nine and Judd Gaudier, [facility] SW about plan of care.  Mrs Langseth preference is not to go to a facility in [place] but in [place] and requested assist with [facility]"  [facility] SW pending response from [facility] facility  Plans: [facility] CM to call Mrs Vergara for transition of care services as recommended at her daughter's contact number listed in EPIC [facility] CM to assist and collaborate with [facility] SW and [facility] SW as needed Note to be routed to pcp and other listed pertinent medical providers of care team  Kimberly L. Lavina Hamman, RN, BSN, [place] Care Management [phone]

## 2016-12-13 NOTE — Progress Notes (Signed)
Received call from patient stating" [facility] kicked me out and I want I want to go to rehab." Patient is arranged with [facility] services provided by [facility] as patient requested and they will be out to see her tomorrow. [facility] SW called to see if she can be transitioned to a facility. Mindi Slicker [facility] [phone]

## 2016-12-14 ENCOUNTER — Encounter: Payer: Self-pay | Admitting: Cardiovascular Disease

## 2016-12-14 ENCOUNTER — Ambulatory Visit (INDEPENDENT_AMBULATORY_CARE_PROVIDER_SITE_OTHER): Payer: BLUE CROSS/BLUE SHIELD | Admitting: Cardiovascular Disease

## 2016-12-14 VITALS — BP 122/52 | HR 66 | Ht 68.0 in | Wt 206.0 lb

## 2016-12-14 DIAGNOSIS — Z955 Presence of coronary angioplasty implant and graft: Secondary | ICD-10-CM | POA: Diagnosis not present

## 2016-12-14 DIAGNOSIS — I779 Disorder of arteries and arterioles, unspecified: Secondary | ICD-10-CM | POA: Diagnosis not present

## 2016-12-14 DIAGNOSIS — I252 Old myocardial infarction: Secondary | ICD-10-CM | POA: Diagnosis not present

## 2016-12-14 DIAGNOSIS — N184 Chronic kidney disease, stage 4 (severe): Secondary | ICD-10-CM

## 2016-12-14 DIAGNOSIS — E785 Hyperlipidemia, unspecified: Secondary | ICD-10-CM | POA: Diagnosis not present

## 2016-12-14 DIAGNOSIS — I5042 Chronic combined systolic (congestive) and diastolic (congestive) heart failure: Secondary | ICD-10-CM

## 2016-12-14 DIAGNOSIS — Z9289 Personal history of other medical treatment: Secondary | ICD-10-CM | POA: Diagnosis not present

## 2016-12-14 DIAGNOSIS — I10 Essential (primary) hypertension: Secondary | ICD-10-CM | POA: Diagnosis not present

## 2016-12-14 DIAGNOSIS — I748 Embolism and thrombosis of other arteries: Secondary | ICD-10-CM

## 2016-12-14 DIAGNOSIS — I25118 Atherosclerotic heart disease of native coronary artery with other forms of angina pectoris: Secondary | ICD-10-CM

## 2016-12-14 DIAGNOSIS — I739 Peripheral vascular disease, unspecified: Secondary | ICD-10-CM

## 2016-12-14 MED ORDER — METOLAZONE 2.5 MG PO TABS: ORAL_TABLET | ORAL | 0 refills | Status: DC

## 2016-12-14 NOTE — Progress Notes (Signed)
SUBJECTIVE: The patient presents after being hospitalized for acute on chronic systolic heart failure and small non-STEMI.  Nuclear stress test on 11/15/16 showed a large area of myocardial scar with no ischemic territories.  Echocardiogram 11/11/16 showed moderately reduced left 614 was systolic function, LVEF 43-15%, grade 2 diastolic dysfunction, elevated filling pressures, multiple wall motion abnormalities involving the inferior and inferolateral myocardium, with mild mitral regurgitation, severe left atrial dilatation, and moderate right atrial dilatation. Pulmonary pressures 44 mmHg.  She feels very weak and tired. Her legs are still swollen. She has some shortness of breath but denies chest pain. She sees her nephrologist, Dr. Lorrene Reid, next week. She is scheduled to see Dr. Lovena Le, [place], on October 12.  She is trying to get into [facility] rehabilitation in [place].  Most recent BUN and creatinine are 55 and 3.17, respectively. GFR is 16.   Review of Systems: As per "subjective", otherwise negative.  Allergies  Allergen Reactions  . Ace Inhibitors Cough  . Amlodipine Other (See Comments)    Edema   . Codeine Rash    Current Outpatient Prescriptions  Medication Sig Dispense Refill  . albuterol (PROVENTIL HFA;VENTOLIN HFA) 108 (90 Base) MCG/ACT inhaler Inhale 1-2 puffs into the lungs every 6 (six) hours as needed for wheezing or shortness of breath. 1 Inhaler 0  . albuterol (PROVENTIL) (2.5 MG/3ML) 0.083% nebulizer solution Take 3 mLs (2.5 mg total) by nebulization every 6 (six) hours as needed for wheezing or shortness of breath. 75 mL 1  . ALPRAZolam (XANAX) 0.5 MG tablet Take 0.5 mg by mouth at bedtime.    Marland Kitchen amiodarone (PACERONE) 200 MG tablet Take 1 tablet (200 mg total) by mouth 2 (two) times daily. New medication to control your heart rate. 60 tablet 2  . amiodarone (PACERONE) 200 MG tablet Take 1 tablet (200 mg total) by mouth 2 (two) times daily. 60 tablet 0  .  cetirizine (ZYRTEC) 10 MG tablet Take 5 mg by mouth daily.  3  . clopidogrel (PLAVIX) 75 MG tablet Take 1 tablet (75 mg total) by mouth daily. 30 tablet 0  . cyclobenzaprine (FLEXERIL) 5 MG tablet Take 5 mg by mouth at bedtime.  0  . diazepam (VALIUM) 5 MG tablet Take 1 tablet (5 mg total) by mouth every 12 (twelve) hours as needed for muscle spasms. 10 tablet 0  . ferrous sulfate 325 (65 FE) MG tablet Take 325 mg by mouth daily with breakfast.     . fluticasone (FLONASE) 50 MCG/ACT nasal spray Place 1 spray into both nostrils daily.    . hydrALAZINE (APRESOLINE) 100 MG tablet Take 0.5 tablets (50 mg total) by mouth 2 (two) times daily.    . insulin glargine (LANTUS) 100 UNIT/ML injection Inject 0.3 mLs (30 Units total) into the skin at bedtime.    . insulin lispro (HUMALOG KWIKPEN) 100 UNIT/ML KiwkPen INJECT 5 TO 20 TOTAL INTO THE SKIN THREE TIMES DAILY (Patient taking differently: Inject 15-21 Units into the skin 3 (three) times daily. )    . isosorbide mononitrate (IMDUR) 60 MG 24 hr tablet Take 0.5 tablets (30 mg total) by mouth daily.    Marland Kitchen latanoprost (XALATAN) 0.005 % ophthalmic solution Place 1 drop into both eyes at bedtime.     . meclizine (ANTIVERT) 25 MG tablet Take 12.5 mg by mouth 2 (two) times daily.     . metoprolol succinate (TOPROL-XL) 100 MG 24 hr tablet Take 1 tablet (100 mg total) by mouth daily with  breakfast. Take with or immediately following a meal. 30 tablet 2  . nitroGLYCERIN (NITROSTAT) 0.4 MG SL tablet Place 1 tablet (0.4 mg total) under the tongue every 5 (five) minutes as needed for chest pain. 25 tablet 2  . pantoprazole (PROTONIX) 40 MG tablet Take 40 mg by mouth daily.     . rosuvastatin (CRESTOR) 40 MG tablet Take 1 tablet (40 mg total) by mouth at bedtime. 30 tablet 0  . torsemide (DEMADEX) 20 MG tablet Take 4 tablets (80 mg total) by mouth 2 (two) times daily. Pt is able to take an additional tablet daily for weight gain greater than 3lbs. (Patient taking  differently: Take 80-100 mg by mouth See admin instructions. Take 4 tablets (80 mg) by mouth twice daily - may take an additional tablet (20 mg) daily for weight gain greater than 3lbs.) 240 tablet 2  . warfarin (COUMADIN) 7.5 MG tablet Your dosing has been changed to one tablet alternating with a half a tablet daily. Starting on the evening of Monday 11/28/16, take 1 tablet, then on Tuesday 9/25 take a half a tablet, then follow the pattern of one tablet alternating with a half a tablet daily. (Patient taking differently: Take 3.75-7.5 mg by mouth See admin instructions. Your dosing has been changed to one tablet alternating with a half a tablet daily. Starting on the evening of Monday 11/28/16, take 1 tablet (7.5 mg), then on Tuesday 9/25 take a half a tablet (3.75 mg), then follow the pattern of one tablet alternating with a half a tablet daily at night) 30 tablet 2   No current facility-administered medications for this visit.     Past Medical History:  Diagnosis Date  . Acute renal failure superimposed on stage 4 chronic kidney disease ([place]) 07/04/2015  . Allergic rhinitis   . Anemia   . Anxiety   . CAD in native artery    NSTEMI 07/2015 s/p DES to RCA and posterior PDA, PCI 10/2015 with scoring balloon to 85% ISR of distal RCA), known LAD/Cx disease treated medically  . Carotid artery disease ([place])    Mild bilateral carotid disease (1-39% 07/2015)  . Chronic combined systolic and diastolic CHF (congestive heart failure) ([place])   . CKD (chronic kidney disease), stage IV ([place])   . Constipation   . Essential hypertension   . GERD (gastroesophageal reflux disease)   . History of hysterectomy   . Hyperlipidemia   . Low back pain   . Obesity   . 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 Osteoarthritis   . Overactive bladder   . PVC's (premature ventricular contractions)   . Type 2 diabetes mellitus ([place])     Past  Surgical History:  Procedure Laterality Date  . ABDOMINAL HYSTERECTOMY    . BACK SURGERY     multiple  . CARDIAC CATHETERIZATION  04/04/2006   Est EF of 60%  . CARDIAC CATHETERIZATION N/A 07/04/2015   Procedure: Left Heart Cath and Coronary Angiography;  Surgeon: Troy Sine, MD;  Location: [place] [facility];  Service: Cardiovascular;  Laterality: N/A;  . CARDIAC CATHETERIZATION N/A 07/04/2015   Procedure: Coronary Stent Intervention;  Surgeon: Troy Sine, MD;  Location: [place] CV LAB;  Service: Cardiovascular;  Laterality: N/A;  . CARDIAC CATHETERIZATION N/A 10/13/2015   Procedure: Left Heart Cath and Coronary Angiography;  Surgeon: Troy Sine, MD;  Location: [place] [facility];  Service: Cardiovascular;  Laterality: N/A;  . CARDIAC CATHETERIZATION N/A 12/02/2015   Procedure: Left Heart Cath and Coronary Angiography;  Surgeon: Peter M Martinique, MD;  Location: [place] CV LAB;  Service: Cardiovascular;  Laterality: N/A;  . CARPAL TUNNEL RELEASE    . CERVICAL BIOPSY     cervical lymph node biopsies  . COLONOSCOPY  May 2002   Dr. Irving Shows :Followup in 5 years, normal exam  . COLONOSCOPY  2008   Dr. Laural Golden: Very redundant colon with mild melanosis coli, splenic flexure polyp biopsy with acute complaint of benign colon polyp. Recommended ten-year followup  . CORONARY STENT PLACEMENT  04/11/2006   2 -- Taxus stents to the circumflex   . PERIPHERAL VASCULAR CATHETERIZATION Right 07/09/2015   Procedure: Upper Extremity Angiography;  Surgeon: Conrad Keeseville, MD;  Location: [place] [facility] LAB;  Service: Cardiovascular;  Laterality: Right;  . PERIPHERAL VASCULAR CATHETERIZATION Right 07/10/2015   Procedure: RIGHT SUBCLAVIAN ARTERY THROMBECTOMY;  Surgeon: Serafina Mitchell, MD;  Location[facility];  Service: Vascular;  Laterality: Right;  . Tendonitis     bilateral elbow  . TRIGGER FINGER RELEASE      Social History   Social History  . Marital status: Married    Spouse name: N/A  .  Number of children: 3  . Years of education: N/A   Occupational History  . Not on file.   Social History Main Topics  . Smoking status: Never Smoker  . Smokeless tobacco: Never Used  . Alcohol use No  . Drug use: No  . Sexual activity: Not on file   Other Topics Concern  . Not on file   Social History Narrative  . No narrative on file     Vitals:   12/14/16 1147  BP: (!) 122/52  Pulse: 66  SpO2: 99%  Weight: 206 lb (93.4 kg)  Height: 5\' 8"  (1.727 m)    Wt Readings from Last 3 Encounters:  12/14/16 206 lb (93.4 kg)  12/12/16 203 lb 8 oz (92.3 kg)  11/27/16 205 lb 11 oz (93.3 kg)     PHYSICAL EXAM General: NAD, chronically ill appearing HEENT: Normal. Neck: No JVD, no thyromegaly. Lungs: Clear to auscultation bilaterally with normal respiratory effort. CV: Nondisplaced PMI.  Regular rate and rhythm, normal S1/S2, no S3/S4, no murmur. 1+ bilateral pitting pretibial  edema.    Abdomen: Soft, nontender, no distention.  Neurologic: Alert and oriented.  Psych: Normal affect. Skin: Normal. Musculoskeletal: No gross deformities.    ECG: Most recent ECG reviewed.   Labs: Lab Results  Component Value Date/Time   K 4.0 12/12/2016 04:03 AM   BUN 55 (H) 12/12/2016 04:03 AM   BUN 53 (H) 02/18/2016 09:37 AM   CREATININE 3.17 (H) 12/12/2016 04:03 AM   CREATININE 1.73 (H) 10/08/2013 10:24 AM   ALT 40 12/04/2016 10:11 AM   TSH 1.199 11/13/2016 07:36 AM   HGB 7.9 (L) 12/12/2016 04:03 AM     Lipids: Lab Results  Component Value Date/Time   LDLCALC 96 07/07/2015 02:59 AM   CHOL 160 07/07/2015 02:59 AM   TRIG 119 07/07/2015 02:59 AM   HDL 40 (L) 07/07/2015 02:59 AM       ASSESSMENT AND PLAN: 1. CAD s/p NSTEMI with PDA and RCA stenting and CTO of left circumflex with RCA in-stent restenosis s/p PCI 10/2015: Symptomatically stable. Given her advanced chronic kidney disease, I am not inclined to do a coronary angiography unless absolutely necessary.  Nuclear stress  test showed myocardial  scar without ischemia. Continue Toprol-XL, Plavix, Crestor, and nitrates.  2. Essential HTN: Controlled. No changes. Amlodipine caused edema in the past.  3. Hyperlipidemia: Continue Crestor 40 mg.  4. Bilateral carotid artery stenosis: Mild (1-39%) on 07/07/15. Will repeat in 2 years. Continue Plavix and statin.  5. 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 Also on warfarin.  6. Chronic combined systolic and diastolic heart failure, LVEF 45-50%: She still has leg edema. I will give metolazone 2.5 mg daily x 3 days and obtain a basic metabolic panel in 2 days. Continue torsemide 80 mg bid. Further diuretic management will likely be done by her nephrologist, Dr. Lorrene Reid.  7. Paroxysmal atrial fibrillation: Continue amiodarone and warfarin. She is also on metoprolol. She will need routine monitoring of thyroid, liver, and pulmonary function.  8. Ischemic cardiomyopathy, LVEF 30-35%: Scheduled to see Dr. Lovena Le, EP, for ICD consideration on 12/16/16.  9. CKD stage IV: Most recent BUN and creatinine are 55 and 3.17, respectively. GFR is 16. As I'm providing metolazone, I will obtain a basic metabolic panel on 83/09/40. She is scheduled to see her nephrologist, Dr. Lorrene Reid, next week.   Disposition: Follow up with me in 3 months.  Time spent: 40 minutes, of which greater than 50% was spent reviewing symptoms, relevant blood tests and studies, and discussing management plan with the patient.    Kate Sable, M.D., F.A.C.C.

## 2016-12-14 NOTE — Patient Instructions (Signed)
Medication Instructions:   Metolazone 2.5mg  daily x 3 days only.  Continue all other medications.    Labwork:  BMET - order given today.  Do this Friday.  Office will contact with results via phone or letter.    Testing/Procedures: none  Follow-Up: 3 months   Any Other Special Instructions Will Be Listed Below (If Applicable).  If you need a refill on your cardiac medications before your next appointment, please call your pharmacy.

## 2016-12-15 DIAGNOSIS — E119 Type 2 diabetes mellitus without complications: Secondary | ICD-10-CM | POA: Diagnosis not present

## 2016-12-15 DIAGNOSIS — I5040 Unspecified combined systolic (congestive) and diastolic (congestive) heart failure: Secondary | ICD-10-CM | POA: Diagnosis not present

## 2016-12-15 DIAGNOSIS — M6281 Muscle weakness (generalized): Secondary | ICD-10-CM | POA: Diagnosis not present

## 2016-12-15 DIAGNOSIS — I4891 Unspecified atrial fibrillation: Secondary | ICD-10-CM | POA: Diagnosis not present

## 2016-12-16 ENCOUNTER — Other Ambulatory Visit: Payer: Self-pay | Admitting: *Deleted

## 2016-12-16 ENCOUNTER — Ambulatory Visit: Payer: BLUE CROSS/BLUE SHIELD | Admitting: Internal Medicine

## 2016-12-16 DIAGNOSIS — I509 Heart failure, unspecified: Secondary | ICD-10-CM | POA: Diagnosis not present

## 2016-12-16 DIAGNOSIS — I4891 Unspecified atrial fibrillation: Secondary | ICD-10-CM | POA: Diagnosis not present

## 2016-12-16 DIAGNOSIS — C189 Malignant neoplasm of colon, unspecified: Secondary | ICD-10-CM | POA: Diagnosis not present

## 2016-12-16 DIAGNOSIS — I5040 Unspecified combined systolic (congestive) and diastolic (congestive) heart failure: Secondary | ICD-10-CM | POA: Diagnosis not present

## 2016-12-16 NOTE — Patient Outreach (Signed)
[facility] Care Management  12/16/2016  [name] June 30, 1945 [id number]   Care coordination/case closure   [facility] CM reviewed [name] via [place] on 12/15/16  Recommendations to Clarify coverage and follow up on next week  [facility] CM spoke with [name] daughter today to confirm she is presently at [facility] rehab snf .  [facility] CM discussed case closure and reasons with [name] who voiced understanding  Plan [facility] CM left a voice message for [name] Dr Berdine Addison Rn to inform her of [name] rehab snf placement [facility] CM left her contact number for a return call prn  case closure for closure reasons: Not eligible for benefit and Consumer enrolled in an external program  [facility] CM updated [name] assisting and connected with [name] to include [facility] SW  Letter to Patient and pcp sent   [facility] CMA T Elkins updated  Care plan updated and goals met   [facility] CM Care Plan Problem One     Most Recent Value  Care Plan Problem One  transition of care services and DME needs  Role Documenting the Problem One  Care Management Coordinator  Care Plan for Problem One  Active  [facility] Long Term Goal   over the next 45 days patient will have services and DME needs identified during transition of care assessment  [facility] Long Term Goal Start Date  12/13/16  [facility] Long Term Goal Met Date  12/16/16  Interventions for Problem One Long Term Goal  Assess for transition of care needs, call pcp for orders, collaborate with [facility] SW, hospital SW, HHA, Re in force discharge instructions, Re review for more identifiable needes using transition of care  [facility] CM Short Term Goal #1   over the next 30 days patient will have services and DME needs in home  [facility] CM Short Term Goal #1 Start Date  12/13/16  [facility] CM Short Term Goal #1 Met Date  12/16/16  Interventions for Short Term Goal #1  Assess for transition of care needs, call pcp for orders, collaborate with [facility] SW,  hospital SW, HHA, Re in force discharge instructions, Re review for more identifiable needes using transition of care      [place]. Lavina Hamman, RN, BSN, [place] Care Management [phone]

## 2016-12-19 ENCOUNTER — Other Ambulatory Visit: Payer: Self-pay | Admitting: Licensed Clinical Social Worker

## 2016-12-19 DIAGNOSIS — I5040 Unspecified combined systolic (congestive) and diastolic (congestive) heart failure: Secondary | ICD-10-CM | POA: Diagnosis not present

## 2016-12-19 DIAGNOSIS — E119 Type 2 diabetes mellitus without complications: Secondary | ICD-10-CM | POA: Diagnosis not present

## 2016-12-19 DIAGNOSIS — I4891 Unspecified atrial fibrillation: Secondary | ICD-10-CM | POA: Diagnosis not present

## 2016-12-19 DIAGNOSIS — N184 Chronic kidney disease, stage 4 (severe): Secondary | ICD-10-CM | POA: Diagnosis not present

## 2016-12-19 NOTE — Patient Outreach (Signed)
Assessment:   CSW had received referral on Bertrice Leder. [place].  Royann Shivers, [facility] Director, recently informed  CSW and RN Joellyn Quails that client was not currently eligible for [facility] program services. CSW spoke via phone with RN Joellyn Quails and she informed CSW that she had spoken with daughter of client. Daughter of client had informed Joellyn Quails RN that client was now residing at [facility] in [place], [place]. Thus, client will receive care at [facility] in [place], [place]. Joellyn Quails RN closed [facility] nursing support for client on 12/16/16.  CSW Theadore Nan is also closing client case on 12/19/16 from [place] services since client will now receive ongoing care at above listed nursing center.   Plan:  CSW is discharging [employer] [place] from [place] on 12/19/16 since client is now receiving care at skilled nursing facility in [place], [place].  CSW to inform Alycia Rossetti, Case Management Assistant, that [facility] discharged client on 12/19/16 from [place] services.  Norva Riffle.Tilden Broz MSW, LCSW Licensed Clinical Social Worker [facility] Care Management [phone]

## 2016-12-20 ENCOUNTER — Ambulatory Visit: Payer: BLUE CROSS/BLUE SHIELD | Admitting: Cardiovascular Disease

## 2016-12-23 ENCOUNTER — Encounter (HOSPITAL_COMMUNITY): Payer: Self-pay | Admitting: Emergency Medicine

## 2016-12-23 ENCOUNTER — Emergency Department (HOSPITAL_COMMUNITY): Payer: BLUE CROSS/BLUE SHIELD

## 2016-12-23 ENCOUNTER — Inpatient Hospital Stay (HOSPITAL_COMMUNITY)
Admission: EM | Admit: 2016-12-23 | Discharge: 2017-01-17 | DRG: 252 | Disposition: A | Discharge status: Home Health | Payer: BLUE CROSS/BLUE SHIELD | Attending: Internal Medicine | Admitting: Internal Medicine

## 2016-12-23 DIAGNOSIS — R748 Abnormal levels of other serum enzymes: Secondary | ICD-10-CM | POA: Diagnosis not present

## 2016-12-23 DIAGNOSIS — Z7901 Long term (current) use of anticoagulants: Secondary | ICD-10-CM

## 2016-12-23 DIAGNOSIS — Z955 Presence of coronary angioplasty implant and graft: Secondary | ICD-10-CM

## 2016-12-23 DIAGNOSIS — E11622 Type 2 diabetes mellitus with other skin ulcer: Secondary | ICD-10-CM | POA: Diagnosis present

## 2016-12-23 DIAGNOSIS — D6832 Hemorrhagic disorder due to extrinsic circulating anticoagulants: Secondary | ICD-10-CM | POA: Diagnosis not present

## 2016-12-23 DIAGNOSIS — N184 Chronic kidney disease, stage 4 (severe): Secondary | ICD-10-CM

## 2016-12-23 DIAGNOSIS — N200 Calculus of kidney: Secondary | ICD-10-CM | POA: Diagnosis present

## 2016-12-23 DIAGNOSIS — J90 Pleural effusion, not elsewhere classified: Secondary | ICD-10-CM | POA: Diagnosis present

## 2016-12-23 DIAGNOSIS — Z794 Long term (current) use of insulin: Secondary | ICD-10-CM | POA: Diagnosis not present

## 2016-12-23 DIAGNOSIS — E876 Hypokalemia: Secondary | ICD-10-CM | POA: Diagnosis not present

## 2016-12-23 DIAGNOSIS — T383X5A Adverse effect of insulin and oral hypoglycemic [antidiabetic] drugs, initial encounter: Secondary | ICD-10-CM | POA: Diagnosis not present

## 2016-12-23 DIAGNOSIS — D631 Anemia in chronic kidney disease: Secondary | ICD-10-CM | POA: Diagnosis present

## 2016-12-23 DIAGNOSIS — F419 Anxiety disorder, unspecified: Secondary | ICD-10-CM | POA: Diagnosis present

## 2016-12-23 DIAGNOSIS — I252 Old myocardial infarction: Secondary | ICD-10-CM

## 2016-12-23 DIAGNOSIS — R34 Anuria and oliguria: Secondary | ICD-10-CM | POA: Diagnosis present

## 2016-12-23 DIAGNOSIS — Z90710 Acquired absence of both cervix and uterus: Secondary | ICD-10-CM

## 2016-12-23 DIAGNOSIS — D684 Acquired coagulation factor deficiency: Secondary | ICD-10-CM | POA: Diagnosis not present

## 2016-12-23 DIAGNOSIS — T462X5A Adverse effect of other antidysrhythmic drugs, initial encounter: Secondary | ICD-10-CM | POA: Diagnosis present

## 2016-12-23 DIAGNOSIS — E669 Obesity, unspecified: Secondary | ICD-10-CM | POA: Diagnosis present

## 2016-12-23 DIAGNOSIS — I255 Ischemic cardiomyopathy: Secondary | ICD-10-CM | POA: Diagnosis present

## 2016-12-23 DIAGNOSIS — E8770 Fluid overload, unspecified: Secondary | ICD-10-CM

## 2016-12-23 DIAGNOSIS — N179 Acute kidney failure, unspecified: Secondary | ICD-10-CM | POA: Diagnosis not present

## 2016-12-23 DIAGNOSIS — I132 Hypertensive heart and chronic kidney disease with heart failure and with stage 5 chronic kidney disease, or end stage renal disease: Secondary | ICD-10-CM | POA: Diagnosis present

## 2016-12-23 DIAGNOSIS — R32 Unspecified urinary incontinence: Secondary | ICD-10-CM | POA: Diagnosis present

## 2016-12-23 DIAGNOSIS — D72819 Decreased white blood cell count, unspecified: Secondary | ICD-10-CM | POA: Diagnosis not present

## 2016-12-23 DIAGNOSIS — L97909 Non-pressure chronic ulcer of unspecified part of unspecified lower leg with unspecified severity: Secondary | ICD-10-CM | POA: Diagnosis present

## 2016-12-23 DIAGNOSIS — Z6827 Body mass index (BMI) 27.0-27.9, adult: Secondary | ICD-10-CM

## 2016-12-23 DIAGNOSIS — E1122 Type 2 diabetes mellitus with diabetic chronic kidney disease: Secondary | ICD-10-CM | POA: Diagnosis present

## 2016-12-23 DIAGNOSIS — Z8249 Family history of ischemic heart disease and other diseases of the circulatory system: Secondary | ICD-10-CM

## 2016-12-23 DIAGNOSIS — I2510 Atherosclerotic heart disease of native coronary artery without angina pectoris: Secondary | ICD-10-CM | POA: Diagnosis present

## 2016-12-23 DIAGNOSIS — I5043 Acute on chronic combined systolic (congestive) and diastolic (congestive) heart failure: Secondary | ICD-10-CM | POA: Diagnosis not present

## 2016-12-23 DIAGNOSIS — Z992 Dependence on renal dialysis: Secondary | ICD-10-CM

## 2016-12-23 DIAGNOSIS — R7989 Other specified abnormal findings of blood chemistry: Secondary | ICD-10-CM

## 2016-12-23 DIAGNOSIS — Z86718 Personal history of other venous thrombosis and embolism: Secondary | ICD-10-CM

## 2016-12-23 DIAGNOSIS — E11649 Type 2 diabetes mellitus with hypoglycemia without coma: Secondary | ICD-10-CM | POA: Diagnosis present

## 2016-12-23 DIAGNOSIS — R0989 Other specified symptoms and signs involving the circulatory and respiratory systems: Secondary | ICD-10-CM | POA: Diagnosis not present

## 2016-12-23 DIAGNOSIS — N186 End stage renal disease: Secondary | ICD-10-CM | POA: Diagnosis not present

## 2016-12-23 DIAGNOSIS — Z833 Family history of diabetes mellitus: Secondary | ICD-10-CM

## 2016-12-23 DIAGNOSIS — E785 Hyperlipidemia, unspecified: Secondary | ICD-10-CM | POA: Diagnosis present

## 2016-12-23 DIAGNOSIS — E1159 Type 2 diabetes mellitus with other circulatory complications: Secondary | ICD-10-CM | POA: Diagnosis present

## 2016-12-23 DIAGNOSIS — E160 Drug-induced hypoglycemia without coma: Secondary | ICD-10-CM | POA: Diagnosis not present

## 2016-12-23 DIAGNOSIS — L8990 Pressure ulcer of unspecified site, unspecified stage: Secondary | ICD-10-CM

## 2016-12-23 DIAGNOSIS — N189 Chronic kidney disease, unspecified: Secondary | ICD-10-CM

## 2016-12-23 DIAGNOSIS — I10 Essential (primary) hypertension: Secondary | ICD-10-CM | POA: Diagnosis not present

## 2016-12-23 DIAGNOSIS — L89621 Pressure ulcer of left heel, stage 1: Secondary | ICD-10-CM | POA: Clinically undetermined

## 2016-12-23 DIAGNOSIS — I272 Pulmonary hypertension, unspecified: Secondary | ICD-10-CM | POA: Diagnosis present

## 2016-12-23 DIAGNOSIS — F410 Panic disorder [episodic paroxysmal anxiety] without agoraphobia: Secondary | ICD-10-CM | POA: Diagnosis not present

## 2016-12-23 DIAGNOSIS — I480 Paroxysmal atrial fibrillation: Secondary | ICD-10-CM | POA: Diagnosis not present

## 2016-12-23 DIAGNOSIS — Z23 Encounter for immunization: Secondary | ICD-10-CM

## 2016-12-23 DIAGNOSIS — N185 Chronic kidney disease, stage 5: Secondary | ICD-10-CM | POA: Diagnosis not present

## 2016-12-23 DIAGNOSIS — K219 Gastro-esophageal reflux disease without esophagitis: Secondary | ICD-10-CM | POA: Diagnosis present

## 2016-12-23 DIAGNOSIS — R778 Other specified abnormalities of plasma proteins: Secondary | ICD-10-CM | POA: Diagnosis present

## 2016-12-23 DIAGNOSIS — D6869 Other thrombophilia: Secondary | ICD-10-CM | POA: Diagnosis not present

## 2016-12-23 DIAGNOSIS — M6281 Muscle weakness (generalized): Secondary | ICD-10-CM | POA: Diagnosis not present

## 2016-12-23 DIAGNOSIS — I25119 Atherosclerotic heart disease of native coronary artery with unspecified angina pectoris: Secondary | ICD-10-CM | POA: Diagnosis not present

## 2016-12-23 DIAGNOSIS — I482 Chronic atrial fibrillation: Secondary | ICD-10-CM | POA: Diagnosis present

## 2016-12-23 DIAGNOSIS — E875 Hyperkalemia: Secondary | ICD-10-CM | POA: Diagnosis not present

## 2016-12-23 DIAGNOSIS — I5040 Unspecified combined systolic (congestive) and diastolic (congestive) heart failure: Secondary | ICD-10-CM | POA: Diagnosis not present

## 2016-12-23 LAB — CBC WITH DIFFERENTIAL/PLATELET
BASOS ABS: 0 10*3/uL (ref 0.0–0.1)
Basophils Relative: 1 %
Eosinophils Absolute: 0 10*3/uL (ref 0.0–0.7)
Eosinophils Relative: 1 %
HEMATOCRIT: 27.9 % — AB (ref 36.0–46.0)
Hemoglobin: 8.4 g/dL — ABNORMAL LOW (ref 12.0–15.0)
LYMPHS PCT: 17 %
Lymphs Abs: 0.7 10*3/uL (ref 0.7–4.0)
MCH: 28.6 pg (ref 26.0–34.0)
MCHC: 30.1 g/dL (ref 30.0–36.0)
MCV: 94.9 fL (ref 78.0–100.0)
MONO ABS: 0.4 10*3/uL (ref 0.1–1.0)
Monocytes Relative: 9 %
NEUTROS ABS: 3 10*3/uL (ref 1.7–7.7)
Neutrophils Relative %: 74 %
Platelets: 226 10*3/uL (ref 150–400)
RBC: 2.94 MIL/uL — AB (ref 3.87–5.11)
RDW: 18.7 % — AB (ref 11.5–15.5)
WBC: 4.1 10*3/uL (ref 4.0–10.5)

## 2016-12-23 LAB — RENAL FUNCTION PANEL
Albumin: 3 g/dL — ABNORMAL LOW (ref 3.5–5.0)
Anion gap: 14 (ref 5–15)
BUN: 104 mg/dL — AB (ref 6–20)
CHLORIDE: 94 mmol/L — AB (ref 101–111)
CO2: 28 mmol/L (ref 22–32)
CREATININE: 4.71 mg/dL — AB (ref 0.44–1.00)
Calcium: 9.1 mg/dL (ref 8.9–10.3)
GFR calc Af Amer: 10 mL/min — ABNORMAL LOW (ref >60)
GFR calc non Af Amer: 8 mL/min — ABNORMAL LOW (ref >60)
Glucose, Bld: 147 mg/dL — ABNORMAL HIGH (ref 65–99)
Phosphorus: 4.6 mg/dL (ref 2.5–4.6)
Potassium: 3.1 mmol/L — ABNORMAL LOW (ref 3.5–5.1)
Sodium: 136 mmol/L (ref 135–145)

## 2016-12-23 LAB — URINALYSIS, ROUTINE W REFLEX MICROSCOPIC
BILIRUBIN URINE: NEGATIVE
GLUCOSE, UA: NEGATIVE mg/dL
KETONES UR: NEGATIVE mg/dL
NITRITE: NEGATIVE
Protein, ur: NEGATIVE mg/dL
Specific Gravity, Urine: 1.009 (ref 1.005–1.030)
pH: 5 (ref 5.0–8.0)

## 2016-12-23 LAB — I-STAT TROPONIN, ED: Troponin i, poc: 0.1 ng/mL (ref 0.00–0.08)

## 2016-12-23 LAB — PROTIME-INR
INR: 4.38
Prothrombin Time: 41.5 seconds — ABNORMAL HIGH (ref 11.4–15.2)

## 2016-12-23 LAB — BRAIN NATRIURETIC PEPTIDE: B Natriuretic Peptide: 2761.4 pg/mL — ABNORMAL HIGH (ref 0.0–100.0)

## 2016-12-23 LAB — CBG MONITORING, ED: Glucose-Capillary: 154 mg/dL — ABNORMAL HIGH (ref 65–99)

## 2016-12-23 MED ORDER — MECLIZINE HCL 25 MG PO TABS
12.5000 mg | ORAL_TABLET | Freq: Two times a day (BID) | ORAL | Status: DC
  Administered 2016-12-24 – 2017-01-17 (×49): 12.5 mg via ORAL
  Filled 2016-12-23 (×49): qty 1

## 2016-12-23 MED ORDER — ACETAMINOPHEN 325 MG PO TABS
650.0000 mg | ORAL_TABLET | ORAL | Status: DC | PRN
  Administered 2016-12-31 – 2017-01-15 (×9): 650 mg via ORAL
  Filled 2016-12-23 (×9): qty 2

## 2016-12-23 MED ORDER — AMIODARONE HCL 200 MG PO TABS
200.0000 mg | ORAL_TABLET | Freq: Two times a day (BID) | ORAL | Status: DC
  Administered 2016-12-23 – 2016-12-26 (×6): 200 mg via ORAL
  Filled 2016-12-23 (×6): qty 1

## 2016-12-23 MED ORDER — INSULIN ASPART 100 UNIT/ML ~~LOC~~ SOLN
0.0000 [IU] | Freq: Three times a day (TID) | SUBCUTANEOUS | Status: DC
  Administered 2016-12-24: 5 [IU] via SUBCUTANEOUS
  Administered 2016-12-24: 3 [IU] via SUBCUTANEOUS
  Administered 2016-12-24: 2 [IU] via SUBCUTANEOUS
  Administered 2016-12-25: 3 [IU] via SUBCUTANEOUS
  Administered 2016-12-25: 5 [IU] via SUBCUTANEOUS
  Administered 2016-12-25: 2 [IU] via SUBCUTANEOUS
  Administered 2016-12-26: 8 [IU] via SUBCUTANEOUS
  Administered 2016-12-26: 5 [IU] via SUBCUTANEOUS
  Administered 2016-12-26: 8 [IU] via SUBCUTANEOUS
  Administered 2016-12-27: 5 [IU] via SUBCUTANEOUS
  Administered 2016-12-27 (×2): 8 [IU] via SUBCUTANEOUS
  Administered 2016-12-28 (×2): 15 [IU] via SUBCUTANEOUS
  Administered 2016-12-28: 8 [IU] via SUBCUTANEOUS
  Administered 2016-12-29 (×2): 11 [IU] via SUBCUTANEOUS
  Administered 2016-12-30: 2 [IU] via SUBCUTANEOUS
  Administered 2016-12-31: 5 [IU] via SUBCUTANEOUS
  Administered 2016-12-31: 3 [IU] via SUBCUTANEOUS
  Administered 2016-12-31: 5 [IU] via SUBCUTANEOUS
  Administered 2017-01-01: 2 [IU] via SUBCUTANEOUS
  Administered 2017-01-01: 5 [IU] via SUBCUTANEOUS
  Administered 2017-01-01 – 2017-01-02 (×2): 3 [IU] via SUBCUTANEOUS
  Administered 2017-01-02: 5 [IU] via SUBCUTANEOUS
  Administered 2017-01-02: 2 [IU] via SUBCUTANEOUS
  Administered 2017-01-03: 5 [IU] via SUBCUTANEOUS
  Administered 2017-01-03: 11 [IU] via SUBCUTANEOUS
  Administered 2017-01-03: 3 [IU] via SUBCUTANEOUS
  Administered 2017-01-04: 8 [IU] via SUBCUTANEOUS
  Administered 2017-01-04: 2 [IU] via SUBCUTANEOUS
  Administered 2017-01-04 – 2017-01-05 (×2): 3 [IU] via SUBCUTANEOUS
  Administered 2017-01-05 (×2): 5 [IU] via SUBCUTANEOUS
  Administered 2017-01-06 (×2): 3 [IU] via SUBCUTANEOUS

## 2016-12-23 MED ORDER — ALBUTEROL SULFATE (2.5 MG/3ML) 0.083% IN NEBU
2.5000 mg | INHALATION_SOLUTION | Freq: Four times a day (QID) | RESPIRATORY_TRACT | Status: DC | PRN
  Administered 2016-12-27 – 2016-12-28 (×2): 2.5 mg via RESPIRATORY_TRACT
  Filled 2016-12-23 (×2): qty 3

## 2016-12-23 MED ORDER — POTASSIUM CHLORIDE CRYS ER 20 MEQ PO TBCR
30.0000 meq | EXTENDED_RELEASE_TABLET | Freq: Two times a day (BID) | ORAL | Status: DC
  Administered 2016-12-23 – 2016-12-30 (×15): 30 meq via ORAL
  Filled 2016-12-23 (×15): qty 1

## 2016-12-23 MED ORDER — INSULIN ASPART 100 UNIT/ML ~~LOC~~ SOLN
10.0000 [IU] | Freq: Three times a day (TID) | SUBCUTANEOUS | Status: DC
  Administered 2016-12-24 (×3): 10 [IU] via SUBCUTANEOUS

## 2016-12-23 MED ORDER — INSULIN GLARGINE 100 UNIT/ML ~~LOC~~ SOLN
30.0000 [IU] | Freq: Every day | SUBCUTANEOUS | Status: DC
  Administered 2016-12-24: 30 [IU] via SUBCUTANEOUS
  Filled 2016-12-23 (×2): qty 0.3

## 2016-12-23 MED ORDER — CYCLOBENZAPRINE HCL 5 MG PO TABS
5.0000 mg | ORAL_TABLET | Freq: Every day | ORAL | Status: DC
  Administered 2016-12-29 – 2017-01-16 (×16): 5 mg via ORAL
  Filled 2016-12-23 (×20): qty 1

## 2016-12-23 MED ORDER — FUROSEMIDE 10 MG/ML IJ SOLN
60.0000 mg | Freq: Two times a day (BID) | INTRAMUSCULAR | Status: DC
  Administered 2016-12-23 – 2016-12-26 (×6): 60 mg via INTRAVENOUS
  Filled 2016-12-23 (×6): qty 6

## 2016-12-23 MED ORDER — ONDANSETRON HCL 4 MG/2ML IJ SOLN
4.0000 mg | Freq: Four times a day (QID) | INTRAMUSCULAR | Status: DC | PRN
  Filled 2016-12-23: qty 2

## 2016-12-23 MED ORDER — MAGNESIUM SULFATE IN D5W 1-5 GM/100ML-% IV SOLN
1.0000 g | Freq: Once | INTRAVENOUS | Status: AC
  Administered 2016-12-24: 1 g via INTRAVENOUS
  Filled 2016-12-23 (×2): qty 100

## 2016-12-23 MED ORDER — INSULIN ASPART 100 UNIT/ML ~~LOC~~ SOLN: 0.0000 [IU] | Freq: Every day | SUBCUTANEOUS | Status: DC

## 2016-12-23 MED ORDER — DIAZEPAM 5 MG PO TABS
5.0000 mg | ORAL_TABLET | Freq: Two times a day (BID) | ORAL | Status: DC | PRN
  Administered 2016-12-27 – 2016-12-31 (×3): 5 mg via ORAL
  Filled 2016-12-23 (×3): qty 1

## 2016-12-23 MED ORDER — HYDRALAZINE HCL 25 MG PO TABS
50.0000 mg | ORAL_TABLET | Freq: Two times a day (BID) | ORAL | Status: DC
  Administered 2016-12-24 – 2016-12-30 (×13): 50 mg via ORAL
  Filled 2016-12-23 (×14): qty 2

## 2016-12-23 MED ORDER — ROSUVASTATIN CALCIUM 40 MG PO TABS
40.0000 mg | ORAL_TABLET | Freq: Every day | ORAL | Status: DC
  Administered 2016-12-24 – 2017-01-16 (×23): 40 mg via ORAL
  Filled 2016-12-23 (×25): qty 1

## 2016-12-23 MED ORDER — LORATADINE 10 MG PO TABS
10.0000 mg | ORAL_TABLET | Freq: Every day | ORAL | Status: DC
  Administered 2016-12-24 – 2017-01-17 (×15): 10 mg via ORAL
  Filled 2016-12-23 (×24): qty 1

## 2016-12-23 MED ORDER — METOPROLOL SUCCINATE ER 50 MG PO TB24
50.0000 mg | ORAL_TABLET | Freq: Every day | ORAL | Status: DC
  Administered 2016-12-24 – 2016-12-26 (×3): 50 mg via ORAL
  Filled 2016-12-23 (×3): qty 1

## 2016-12-23 MED ORDER — SODIUM CHLORIDE 0.9 % IV SOLN: 250.0000 mL | INTRAVENOUS | Status: DC | PRN

## 2016-12-23 MED ORDER — PANTOPRAZOLE SODIUM 40 MG PO TBEC
40.0000 mg | DELAYED_RELEASE_TABLET | Freq: Every day | ORAL | Status: DC
  Administered 2016-12-24 – 2017-01-17 (×25): 40 mg via ORAL
  Filled 2016-12-23 (×26): qty 1

## 2016-12-23 MED ORDER — LATANOPROST 0.005 % OP SOLN
1.0000 [drp] | Freq: Every day | OPHTHALMIC | Status: DC
  Administered 2016-12-24 – 2017-01-16 (×24): 1 [drp] via OPHTHALMIC
  Filled 2016-12-23: qty 2.5

## 2016-12-23 MED ORDER — ISOSORBIDE MONONITRATE ER 30 MG PO TB24
30.0000 mg | ORAL_TABLET | Freq: Two times a day (BID) | ORAL | Status: DC
  Administered 2016-12-24 – 2017-01-17 (×49): 30 mg via ORAL
  Filled 2016-12-23 (×50): qty 1

## 2016-12-23 MED ORDER — ALPRAZOLAM 0.25 MG PO TABS: 0.5000 mg | ORAL_TABLET | Freq: Every day | ORAL | Status: DC

## 2016-12-23 MED ORDER — FLUTICASONE PROPIONATE 50 MCG/ACT NA SUSP
1.0000 | Freq: Every day | NASAL | Status: DC
  Administered 2016-12-26 – 2017-01-17 (×20): 1 via NASAL
  Filled 2016-12-23: qty 16

## 2016-12-23 MED ORDER — SODIUM CHLORIDE 0.9% FLUSH
3.0000 mL | INTRAVENOUS | Status: DC | PRN
  Administered 2017-01-03: 3 mL via INTRAVENOUS
  Filled 2016-12-23: qty 3

## 2016-12-23 MED ORDER — SODIUM CHLORIDE 0.9% FLUSH
3.0000 mL | Freq: Two times a day (BID) | INTRAVENOUS | Status: DC
  Administered 2016-12-24 – 2017-01-12 (×29): 3 mL via INTRAVENOUS

## 2016-12-23 MED ORDER — CLOPIDOGREL BISULFATE 75 MG PO TABS
75.0000 mg | ORAL_TABLET | Freq: Every day | ORAL | Status: DC
  Administered 2016-12-24 – 2017-01-10 (×18): 75 mg via ORAL
  Filled 2016-12-23 (×18): qty 1

## 2016-12-23 NOTE — H&P (Signed)
History and Physical    KRISTOPHER DELK KDT:[id number] DOB: 01-Apr-1945 DOA: 12/23/2016  PCP: [name], MD   Patient coming from: Home  Chief Complaint: Swelling, fatigue   HPI: [name] is a 71 y.o. female with medical history significant for insulin-dependent diabetes mellitus, chronic kidney disease stage IV, chronic combined systolic/diastolic CHF, chronic atrial fibrillation, and coronary artery disease, now presenting into the emergency department at the direction of her nephrologist for evaluation of swelling and fatigue. Patient has been followed by cardiology and nephrology with worsening fluid status complicated by worsening kidney function. She has recently had her diuretics increased, but continues to be significantly up from her usual weight and her kidney function has worsened significantly over the past month or so. Patient denies any chest pain. She denies dyspnea while at rest. There has not been any fevers, chills, or significant cough. She reports fatigue, worsening progressively over the past couple months.  ED Course: Upon arrival to the ED, patient is found to be afebrile, saturating well on room air, bradycardic in the high 40s, and with vitals otherwise stable. EKG features a sinus bradycardia with low voltage QRS and QTc interval of 562 ms. Chest x-ray is notable for right greater than left pleural effusions and central vascular congestion. Chemistry panel reveals a potassium of 3.1, BUN 104, and creatinine 4.71. CBC is notable for stable chronic normocytic anemia with hemoglobin of 8.4. INR is elevated to 4.38, troponin is elevated to 0.10, and BNP is elevated to 2761. Patient remained hemodynamically stable in the emergency department, has not been in any apparent respiratory distress, and will be admitted to the telemetry unit for ongoing evaluation and management of acute on chronic combined CHF complicated by severe and worsening renal function.  Review of  Systems:  All other systems reviewed and apart from HPI, are negative.  Past Medical History:  Diagnosis Date  . Acute renal failure superimposed on stage 4 chronic kidney disease ([place]) 07/04/2015  . Allergic rhinitis   . Anemia   . Anxiety   . CAD in native artery    NSTEMI 07/2015 s/p DES to RCA and posterior PDA, PCI 10/2015 with scoring balloon to 85% ISR of distal RCA), known LAD/Cx disease treated medically  . Carotid artery disease ([place])    Mild bilateral carotid disease (1-39% 07/2015)  . Chronic combined systolic and diastolic CHF (congestive heart failure) ([place])   . CKD (chronic kidney disease), stage IV ([place])   . Constipation   . Essential hypertension   . GERD (gastroesophageal reflux disease)   . History of hysterectomy   . Hyperlipidemia   . Low back pain   . Obesity   . 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 Osteoarthritis   . Overactive bladder   . PVC's (premature ventricular contractions)   . Type 2 diabetes mellitus ([place])     Past Surgical History:  Procedure Laterality Date  . ABDOMINAL HYSTERECTOMY    . BACK SURGERY     multiple  . CARDIAC CATHETERIZATION  04/04/2006   Est EF of 60%  . CARDIAC CATHETERIZATION N/A 07/04/2015   Procedure: Left Heart Cath and Coronary Angiography;  Surgeon: Troy Sine, MD;  Location: [facility];  Service: Cardiovascular;  Laterality: N/A;  . CARDIAC CATHETERIZATION N/A 07/04/2015   Procedure: Coronary Stent Intervention;  Surgeon: Troy Sine, MD;  Location: [facility];  Service: Cardiovascular;  Laterality: N/A;  . CARDIAC CATHETERIZATION N/A 10/13/2015   Procedure: Left Heart Cath and Coronary Angiography;  Surgeon: Troy Sine, MD;  Location: [facility];  Service: Cardiovascular;  Laterality: N/A;  . CARDIAC CATHETERIZATION N/A 12/02/2015   Procedure: Left Heart Cath and Coronary Angiography;  Surgeon: Peter M Martinique,  MD;  Location: [facility];  Service: Cardiovascular;  Laterality: N/A;  . CARPAL TUNNEL RELEASE    . CERVICAL BIOPSY     cervical lymph node biopsies  . COLONOSCOPY  May 2002   Dr. Irving Shows :Followup in 5 years, normal exam  . COLONOSCOPY  2008   Dr. Laural Golden: Very redundant colon with mild melanosis coli, splenic flexure polyp biopsy with acute complaint of benign colon polyp. Recommended ten-year followup  . CORONARY STENT PLACEMENT  04/11/2006   2 -- Taxus stents to the circumflex   . PERIPHERAL VASCULAR CATHETERIZATION Right 07/09/2015   Procedure: Upper Extremity Angiography;  Surgeon: Conrad Pinehurst, MD;  Location: [facility];  Service: Cardiovascular;  Laterality: Right;  . PERIPHERAL VASCULAR CATHETERIZATION Right 07/10/2015   Procedure: RIGHT SUBCLAVIAN ARTERY THROMBECTOMY;  Surgeon: Serafina Mitchell, MD;  Location: [facility];  Service: Vascular;  Laterality: Right;  . Tendonitis     bilateral elbow  . TRIGGER FINGER RELEASE       reports that she has never smoked. She has never used smokeless tobacco. She reports that she does not drink alcohol or use drugs.  Allergies  Allergen Reactions  . Ace Inhibitors Cough  . Amlodipine Other (See Comments)    Edema   . Codeine Rash    Family History  Problem Relation Age of Onset  . Diabetes Father   . Hypertension Father   . Stroke Father   . Hypertension Brother   . Aneurysm Brother   . Diabetes Brother   . Colon cancer Neg Hx      Prior to Admission medications   Medication Sig Start Date End Date Taking? Authorizing Provider  amiodarone (PACERONE) 200 MG tablet Take 1 tablet (200 mg total) by mouth 2 (two) times daily. New medication to control your heart rate. 11/27/16  Yes Rexene Alberts, MD  clopidogrel (PLAVIX) 75 MG tablet Take 1 tablet (75 mg total) by mouth daily. 12/13/16  Yes Georgette Shell, MD  isosorbide mononitrate (IMDUR) 60 MG 24 hr tablet Take 0.5 tablets (30 mg total) by mouth  daily. Patient taking differently: Take 30 mg by mouth 2 (two) times daily.  11/27/16  Yes Rexene Alberts, MD  metolazone (ZAROXOLYN) 2.5 MG tablet Take one tab by mouth x 3 days only, then only as directed by MD 12/14/16  Yes Herminio Commons, MD  metoprolol succinate (TOPROL-XL) 100 MG 24 hr tablet Take 1 tablet (100 mg total) by mouth daily with breakfast. Take with or immediately following a meal. 11/28/16  Yes Rexene Alberts, MD  albuterol (PROVENTIL HFA;VENTOLIN HFA) 108 (90 Base) MCG/ACT inhaler Inhale 1-2 puffs into the lungs every 6 (six) hours as needed for wheezing or shortness of breath. 04/01/16   Varney Biles, MD  albuterol (PROVENTIL) (2.5 MG/3ML) 0.083% nebulizer solution Take 3 mLs (2.5 mg total) by nebulization every 6 (six) hours as needed for wheezing or shortness of breath. 07/16/16   Elgergawy, Silver Huguenin, MD  ALPRAZolam Duanne Moron) 0.5 MG tablet Take 0.5 mg by mouth at bedtime.    [provider]  amiodarone (PACERONE) 200 MG tablet Take 1 tablet (200 mg total)  by mouth 2 (two) times daily. 12/12/16   Georgette Shell, MD  cetirizine (ZYRTEC) 10 MG tablet Take 5 mg by mouth daily. 10/24/16   [provider]  cyclobenzaprine (FLEXERIL) 5 MG tablet Take 5 mg by mouth at bedtime. 10/03/16   [provider]  diazepam (VALIUM) 5 MG tablet Take 1 tablet (5 mg total) by mouth every 12 (twelve) hours as needed for muscle spasms. 10/29/16   Julianne Rice, MD  ferrous sulfate 325 (65 FE) MG tablet Take 325 mg by mouth daily with breakfast.     [provider]  fluticasone (FLONASE) 50 MCG/ACT nasal spray Place 1 spray into both nostrils daily.    [provider]  hydrALAZINE (APRESOLINE) 100 MG tablet Take 0.5 tablets (50 mg total) by mouth 2 (two) times daily. 11/27/16   Rexene Alberts, MD  insulin glargine (LANTUS) 100 UNIT/ML injection Inject 0.3 mLs (30 Units total) into the skin at bedtime. 11/27/16   Rexene Alberts, MD  insulin lispro  (HUMALOG KWIKPEN) 100 UNIT/ML KiwkPen INJECT 5 TO 20 TOTAL INTO THE SKIN THREE TIMES DAILY Patient taking differently: Inject 15-21 Units into the skin 3 (three) times daily.  11/27/16   Rexene Alberts, MD  latanoprost (XALATAN) 0.005 % ophthalmic solution Place 1 drop into both eyes at bedtime.     [provider]  meclizine (ANTIVERT) 25 MG tablet Take 12.5 mg by mouth 2 (two) times daily.     [provider]  nitroGLYCERIN (NITROSTAT) 0.4 MG SL tablet Place 1 tablet (0.4 mg total) under the tongue every 5 (five) minutes as needed for chest pain. 10/14/15   Lyda Jester M, PA-C  pantoprazole (PROTONIX) 40 MG tablet Take 40 mg by mouth daily.     [provider]  rosuvastatin (CRESTOR) 40 MG tablet Take 1 tablet (40 mg total) by mouth at bedtime. 07/12/15   Lily Kocher, MD  torsemide (DEMADEX) 20 MG tablet Take 4 tablets (80 mg total) by mouth 2 (two) times daily. Pt is able to take an additional tablet daily for weight gain greater than 3lbs. Patient taking differently: Take 80-100 mg by mouth See admin instructions. Take 4 tablets (80 mg) by mouth twice daily - may take an additional tablet (20 mg) daily for weight gain greater than 3lbs. 11/27/16   Rexene Alberts, MD  warfarin (COUMADIN) 7.5 MG tablet Your dosing has been changed to one tablet alternating with a half a tablet daily. Starting on the evening of Monday 11/28/16, take 1 tablet, then on Tuesday 9/25 take a half a tablet, then follow the pattern of one tablet alternating with a half a tablet daily. Patient taking differently: Take 3.75-7.5 mg by mouth See admin instructions. Your dosing has been changed to one tablet alternating with a half a tablet daily. Starting on the evening of Monday 11/28/16, take 1 tablet (7.5 mg), then on Tuesday 9/25 take a half a tablet (3.75 mg), then follow the pattern of one tablet alternating with a half a tablet daily at night 11/27/16   Rexene Alberts, MD    Physical  Exam: Vitals:   12/23/16 1830 12/23/16 1845 12/23/16 1900 12/23/16 1930  BP: (!) 144/67  140/74 (!) 145/69  Pulse: (!) 53 (!) 52 (!) 51 (!) 51  Resp: 19 (!) 22 20 (!) 25  Temp:      TempSrc:      SpO2: 97% 99% 98% 96%  Weight:      Height:  Constitutional: NAD, calm, comfortable Eyes: PERTLA, lids and conjunctivae normal ENMT: Mucous membranes are moist. Posterior pharynx clear of any exudate or lesions.   Neck: normal, supple, no masses, no thyromegaly Respiratory: Diminished at bilateral bases, fine rales bilaterally. Normal respiratory effort.  Cardiovascular: S1 & S2 heard, regular rate and rhythm. BLE edema into thighs. Neck vein distension. Abdomen: No distension, no tenderness, no masses palpated. Bowel sounds normal.  Musculoskeletal: no clubbing / cyanosis. No joint deformity upper and lower extremities.  Skin: Lower legs wrapped in gauze bilaterally. Warm, dry, well-perfused. Neurologic: CN 2-12 grossly intact. Sensation intact. Strength 5/5 in all 4 limbs.  Psychiatric: Alert and oriented x 3. Calm, cooperative.   Labs on Admission: I have personally reviewed following labs and imaging studies  CBC:  Recent Labs Lab 12/23/16 1855  WBC 4.1  NEUTROABS 3.0  HGB 8.4*  HCT 27.9*  MCV 94.9  PLT 409   Basic Metabolic Panel:  Recent Labs Lab 12/23/16 1855  NA 136  K 3.1*  CL 94*  CO2 28  GLUCOSE 147*  BUN 104*  CREATININE 4.71*  CALCIUM 9.1  PHOS 4.6   GFR: Estimated Creatinine Clearance: 13.1 mL/min (A) (by C-G formula based on SCr of 4.71 mg/dL (H)). Liver Function Tests:  Recent Labs Lab 12/23/16 1855  ALBUMIN 3.0*   No results for input(s): LIPASE, AMYLASE in the last 168 hours. No results for input(s): AMMONIA in the last 168 hours. Coagulation Profile:  Recent Labs Lab 12/23/16 1855  INR 4.38*   Cardiac Enzymes: No results for input(s): CKTOTAL, CKMB, CKMBINDEX, TROPONINI in the last 168 hours. BNP (last 3 results) No  results for input(s): PROBNP in the last 8760 hours. HbA1C: No results for input(s): HGBA1C in the last 72 hours. CBG: No results for input(s): GLUCAP in the last 168 hours. Lipid Profile: No results for input(s): CHOL, HDL, LDLCALC, TRIG, CHOLHDL, LDLDIRECT in the last 72 hours. Thyroid Function Tests: No results for input(s): TSH, T4TOTAL, FREET4, T3FREE, THYROIDAB in the last 72 hours. Anemia Panel: No results for input(s): VITAMINB12, FOLATE, FERRITIN, TIBC, IRON, RETICCTPCT in the last 72 hours. Urine analysis:    Component Value Date/Time   COLORURINE YELLOW 12/23/2016 1810   APPEARANCEUR CLEAR 12/23/2016 1810   LABSPEC 1.009 12/23/2016 1810   PHURINE 5.0 12/23/2016 1810   GLUCOSEU NEGATIVE 12/23/2016 1810   HGBUR SMALL (A) 12/23/2016 1810   BILIRUBINUR NEGATIVE 12/23/2016 1810   KETONESUR NEGATIVE 12/23/2016 1810   PROTEINUR NEGATIVE 12/23/2016 1810   UROBILINOGEN 0.2 08/23/2007 2037   NITRITE NEGATIVE 12/23/2016 1810   LEUKOCYTESUR LARGE (A) 12/23/2016 1810   Sepsis Labs: @LABRCNTIP (procalcitonin:4,lacticidven:4) )No results found for this or any previous visit (from the past 240 hour(s)).   Radiological Exams on Admission: Dg Chest 2 View  Result Date: 12/23/2016 CLINICAL DATA:  Bilateral lower leg and pedal edema. Pleural effusion and weakness. EXAM: CHEST  2 VIEW COMPARISON:  12/07/2016 FINDINGS: Moderate right and small left pleural effusions persist with mild central vascular congestion. Adjacent compressive atelectasis is noted. Heart size and mediastinal contours are stable to the extent visualized. No acute nor suspicious osseous lesions. Degenerate changes are seen along the lower thoracic spine. IMPRESSION: Right greater than left pleural effusions with central vascular congestion. Findings are similar that of 12/07/2016. Electronically Signed   By: Ashley Royalty M.D.   On: 12/23/2016 17:51    EKG: Independently reviewed. Sinus bradycardia (rate 51), low-voltage,  QTc 526 ms.   Assessment/Plan  1. Acute on chronic  combined systolic/diastolic CHF  - Pt presents with fluid-overload, complicated by severe and worsening CKD  - Following with cardiology and nephrology and treated with torsemide 80 BID and recent addition of metolazone  - Echo updated last month, EF 96-22%, grade 2 diastolic dysfunction, mild MR, severe LAE - Plan to SLIV, fluid-restrict diet, follow daily wts and I/O's, diurese with Lasix 60 mg IV BID, continue cardiac monitoring, follow daily chem panels, continue beta-blocker as tolerated    2. Acute kidney injury superimposed on CKD stage IV  - BUN is 147 and SCr 4.71 on admission - Creatinine had been in 2's until last month  - Following with nephrology and cardiology for worsening fluid status and renal function - She is volume-overloaded on admission and will be diuresed as above, following daily chem panels  - Renally-dose medications, avoid nephrotoxins where possible    3. Atrial fibrillation, supratherapeutic INR  - In a sinus rhythm on admission  - CHADS-VASc is 13 (age, gender, CHF, DM, CAD)  - INR is 4.38; no bleeding, hold warfarin until therapeutic  - Continue amiodarone    4. CAD, elevated troponin  - No anginal complaints  - Troponin elevated to 0.10 on admission, likely reflecting demand ischemia related to #1 and #2 above  - Continue cardiac monitoring, trend troponin, continue Plavix, continue Toprol with reduced-dose given bradycardia in 40's, Crestor, nitrates    5. Hypokalemia  - Serum potassium is 3.1 on admission  - Supplement cautiously given severe renal insufficiency  - Started on 30 mEq BID - Follow daily chem panel during diuresis    6. Type II DM  - A1c was 8.8% in August 2018   - Managed at home with Lantus 30 units qHS and Humalog 15-21 units TID   - Plan to check CBG with meals and qHS, continue Lantus 30 units qHS and start Novolog 10 units TID and per sliding-scale    DVT prophylaxis:  warfarin  Code Status: Full  Family Communication: Discussed with patient Disposition Plan: Admit to telemetry Consults called: None Admission status: Inpatient    Vianne Bulls, MD [facility] Pager [phone]  If 7PM-7AM, please contact night-coverage [email or web address] Password [email or web address]  12/23/2016, 9:08 PM

## 2016-12-23 NOTE — ED Provider Notes (Signed)
[facility] EMERGENCY DEPARTMENT Provider Note   CSN: [id number] Arrival date & time: 12/23/16  1530     History   Chief Complaint Chief Complaint  Patient presents with  . Weakness    fluid retention    HPI [name] is a 71 y.o. female with extensive past medical history including chronic kidney disease, diabetes, hypertension, CAD with non-STEMIs and PCI/stents, systolic CHF, anemia, atrial fibrillation on amiodorone, warfarin and plavix presents to the ED for generalized weakness and bilateral lower extremity swelling worsening x 1 day. Received a phone call from nephrologist who advised she came to ED for admission for fluid excess and elevated creatinine. Base Cr usually 2-3.  Denies fevers, cough, chest pain, shortness of breath, vomiting, diarrhea, abdominal pain or swelling, dysuria, hematuria.  HPI  Past Medical History:  Diagnosis Date  . Acute renal failure superimposed on stage 4 chronic kidney disease ([place]) 07/04/2015  . Allergic rhinitis   . Anemia   . Anxiety   . CAD in native artery    NSTEMI 07/2015 s/p DES to RCA and posterior PDA, PCI 10/2015 with scoring balloon to 85% ISR of distal RCA), known LAD/Cx disease treated medically  . Carotid artery disease ([place])    Mild bilateral carotid disease (1-39% 07/2015)  . Chronic combined systolic and diastolic CHF (congestive heart failure) ([place])   . CKD (chronic kidney disease), stage IV ([place])   . Constipation   . Essential hypertension   . GERD (gastroesophageal reflux disease)   . History of hysterectomy   . Hyperlipidemia   . Low back pain   . Obesity   . 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 Osteoarthritis   . Overactive bladder   . PVC's (premature ventricular contractions)   . Type 2 diabetes mellitus [facility])     Patient Active Problem List   Diagnosis Date Noted  . Hypokalemia 12/23/2016  . Goals of  care, counseling/discussion   . Palliative care encounter   . Hypercoagulopathy ([place]) 12/03/2016  . Acute on chronic heart failure ([place]) 12/03/2016  . Ischemic cardiomyopathy   . Generalized weakness   . Atrial fibrillation with RVR ([place]) 11/19/2016  . Abnormal nuclear stress test   . Left ventricular dysfunction   . Atrial fibrillation ([place]) 11/13/2016  . Anemia in chronic kidney disease   . Atrial fibrillation, new onset ([place]) 11/10/2016  . Hypoglycemia 11/10/2016  . Pleural effusion on right 10/31/2016  . HCAP (healthcare-associated pneumonia) 10/31/2016  . DM type 2 causing vascular disease ([place]) 10/05/2016  . Personal history of noncompliance with medical treatment, presenting hazards to health 08/22/2016  . Hyperglycemia 08/07/2016  . Pulmonary edema 07/14/2016  . CKD (chronic kidney disease), stage IV ([place]) 07/14/2016  . Acute respiratory failure with hypoxia ([place]) 07/09/2016  . Acute on chronic systolic CHF (congestive heart failure) ([place]) 12/01/2015  . AKI (acute kidney injury) ([place])   . Ischemia of upper extremity   . Right knee pain   . Subclavian artery stenosis, right ([place]) 07/07/2015  . Acute on chronic combined systolic and diastolic CHF (congestive heart failure) ([place]) 07/04/2015  . Elevated troponin 07/04/2015  . Acute renal failure superimposed on chronic kidney disease ([place]) 07/04/2015  . Non-ST elevation (NSTEMI) myocardial infarction ([place]) 07/04/2015  . Elevated d-dimer 07/04/2015  . Acute respiratory failure with hypercapnia ([place])   . Bilateral lower extremity edema 01/20/2012  . Type 2 diabetes mellitus with stage  4 chronic kidney disease, with long-term current use of insulin ([place]) 01/21/2011  . Overweight 07/20/2009  . Coronary artery disease 10/08/2008  . HEART MURMUR, SYSTOLIC 37/62/8315  . SHOULDER PAIN 02/05/2007  . Hyperlipemia 04/11/2006  . Anxiety state 04/11/2006  . SYNDROME, CARPAL TUNNEL 04/11/2006  . Essential hypertension 04/11/2006  .  ALLERGIC RHINITIS 04/11/2006  . GERD 04/11/2006  . Constipation 04/11/2006  . OVERACTIVE BLADDER 04/11/2006  . OSTEOARTHRITIS 04/11/2006  . LOW BACK PAIN 04/11/2006    Past Surgical History:  Procedure Laterality Date  . ABDOMINAL HYSTERECTOMY    . BACK SURGERY     multiple  . CARDIAC CATHETERIZATION  04/04/2006   Est EF of 60%  . CARDIAC CATHETERIZATION N/A 07/04/2015   Procedure: Left Heart Cath and Coronary Angiography;  Surgeon: Troy Sine, MD;  Location: [facility] LAB;  Service: Cardiovascular;  Laterality: N/A;  . CARDIAC CATHETERIZATION N/A 07/04/2015   Procedure: Coronary Stent Intervention;  Surgeon: Troy Sine, MD;  Location: [facility] CV LAB;  Service: Cardiovascular;  Laterality: N/A;  . CARDIAC CATHETERIZATION N/A 10/13/2015   Procedure: Left Heart Cath and Coronary Angiography;  Surgeon: Troy Sine, MD;  Location: [facility] LAB;  Service: Cardiovascular;  Laterality: N/A;  . CARDIAC CATHETERIZATION N/A 12/02/2015   Procedure: Left Heart Cath and Coronary Angiography;  Surgeon: Peter M Martinique, MD;  Location: [facility] LAB;  Service: Cardiovascular;  Laterality: N/A;  . CARPAL TUNNEL RELEASE    . CERVICAL BIOPSY     cervical lymph node biopsies  . COLONOSCOPY  May 2002   Dr. Irving Shows :Followup in 5 years, normal exam  . COLONOSCOPY  2008   Dr. Laural Golden: Very redundant colon with mild melanosis coli, splenic flexure polyp biopsy with acute complaint of benign colon polyp. Recommended ten-year followup  . CORONARY STENT PLACEMENT  04/11/2006   2 -- Taxus stents to the circumflex   . PERIPHERAL VASCULAR CATHETERIZATION Right 07/09/2015   Procedure: Upper Extremity Angiography;  Surgeon: Conrad Richland, MD;  Location: [facility];  Service: Cardiovascular;  Laterality: Right;  . PERIPHERAL VASCULAR CATHETERIZATION Right 07/10/2015   Procedure: RIGHT SUBCLAVIAN ARTERY THROMBECTOMY;  Surgeon: Serafina Mitchell, MD;  Location: [facility] OR;  Service: Vascular;   Laterality: Right;  . Tendonitis     bilateral elbow  . TRIGGER FINGER RELEASE      OB History    No data available       Home Medications    Prior to Admission medications   Medication Sig Start Date End Date Taking? Authorizing Provider  amiodarone (PACERONE) 200 MG tablet Take 1 tablet (200 mg total) by mouth 2 (two) times daily. New medication to control your heart rate. 11/27/16  Yes Rexene Alberts, MD  clopidogrel (PLAVIX) 75 MG tablet Take 1 tablet (75 mg total) by mouth daily. 12/13/16  Yes Georgette Shell, MD  isosorbide mononitrate (IMDUR) 60 MG 24 hr tablet Take 0.5 tablets (30 mg total) by mouth daily. Patient taking differently: Take 30 mg by mouth 2 (two) times daily.  11/27/16  Yes Rexene Alberts, MD  metolazone (ZAROXOLYN) 2.5 MG tablet Take one tab by mouth x 3 days only, then only as directed by MD 12/14/16  Yes Herminio Commons, MD  metoprolol succinate (TOPROL-XL) 100 MG 24 hr tablet Take 1 tablet (100 mg total) by mouth daily with breakfast. Take with or immediately following a meal. 11/28/16  Yes Rexene Alberts, MD  albuterol (PROVENTIL  HFA;VENTOLIN HFA) 108 (90 Base) MCG/ACT inhaler Inhale 1-2 puffs into the lungs every 6 (six) hours as needed for wheezing or shortness of breath. 04/01/16   Varney Biles, MD  albuterol (PROVENTIL) (2.5 MG/3ML) 0.083% nebulizer solution Take 3 mLs (2.5 mg total) by nebulization every 6 (six) hours as needed for wheezing or shortness of breath. 07/16/16   Elgergawy, Silver Huguenin, MD  ALPRAZolam Duanne Moron) 0.5 MG tablet Take 0.5 mg by mouth at bedtime.    [provider]  amiodarone (PACERONE) 200 MG tablet Take 1 tablet (200 mg total) by mouth 2 (two) times daily. 12/12/16   Georgette Shell, MD  cetirizine (ZYRTEC) 10 MG tablet Take 5 mg by mouth daily. 10/24/16   [provider]  cyclobenzaprine (FLEXERIL) 5 MG tablet Take 5 mg by mouth at bedtime. 10/03/16   [provider]  diazepam (VALIUM) 5 MG tablet  Take 1 tablet (5 mg total) by mouth every 12 (twelve) hours as needed for muscle spasms. 10/29/16   Julianne Rice, MD  ferrous sulfate 325 (65 FE) MG tablet Take 325 mg by mouth daily with breakfast.     [provider]  fluticasone (FLONASE) 50 MCG/ACT nasal spray Place 1 spray into both nostrils daily.    [provider]  hydrALAZINE (APRESOLINE) 100 MG tablet Take 0.5 tablets (50 mg total) by mouth 2 (two) times daily. 11/27/16   Rexene Alberts, MD  insulin glargine (LANTUS) 100 UNIT/ML injection Inject 0.3 mLs (30 Units total) into the skin at bedtime. 11/27/16   Rexene Alberts, MD  insulin lispro (HUMALOG KWIKPEN) 100 UNIT/ML KiwkPen INJECT 5 TO 20 TOTAL INTO THE SKIN THREE TIMES DAILY Patient taking differently: Inject 15-21 Units into the skin 3 (three) times daily.  11/27/16   Rexene Alberts, MD  latanoprost (XALATAN) 0.005 % ophthalmic solution Place 1 drop into both eyes at bedtime.     [provider]  meclizine (ANTIVERT) 25 MG tablet Take 12.5 mg by mouth 2 (two) times daily.     [provider]  nitroGLYCERIN (NITROSTAT) 0.4 MG SL tablet Place 1 tablet (0.4 mg total) under the tongue every 5 (five) minutes as needed for chest pain. 10/14/15   Lyda Jester M, PA-C  pantoprazole (PROTONIX) 40 MG tablet Take 40 mg by mouth daily.     [provider]  rosuvastatin (CRESTOR) 40 MG tablet Take 1 tablet (40 mg total) by mouth at bedtime. 07/12/15   Lily Kocher, MD  torsemide (DEMADEX) 20 MG tablet Take 4 tablets (80 mg total) by mouth 2 (two) times daily. Pt is able to take an additional tablet daily for weight gain greater than 3lbs. Patient taking differently: Take 80-100 mg by mouth See admin instructions. Take 4 tablets (80 mg) by mouth twice daily - may take an additional tablet (20 mg) daily for weight gain greater than 3lbs. 11/27/16   Rexene Alberts, MD  warfarin (COUMADIN) 7.5 MG tablet Your dosing has been changed to one tablet alternating  with a half a tablet daily. Starting on the evening of Monday 11/28/16, take 1 tablet, then on Tuesday 9/25 take a half a tablet, then follow the pattern of one tablet alternating with a half a tablet daily. Patient taking differently: Take 3.75-7.5 mg by mouth See admin instructions. Your dosing has been changed to one tablet alternating with a half a tablet daily. Starting on the evening of Monday 11/28/16, take 1 tablet (7.5 mg), then on Tuesday 9/25 take a half a tablet (  3.75 mg), then follow the pattern of one tablet alternating with a half a tablet daily at night 11/27/16   Rexene Alberts, MD    Family History Family History  Problem Relation Age of Onset  . Diabetes Father   . Hypertension Father   . Stroke Father   . Hypertension Brother   . Aneurysm Brother   . Diabetes Brother   . Colon cancer Neg Hx     Social History Social History  Substance Use Topics  . Smoking status: Never Smoker  . Smokeless tobacco: Never Used  . Alcohol use No     Allergies   Ace inhibitors; Amlodipine; and Codeine   Review of Systems Review of Systems  Constitutional: Positive for fatigue. Negative for chills and fever.  HENT: Negative for congestion and sore throat.   Respiratory: Positive for cough. Negative for shortness of breath.   Cardiovascular: Negative for chest pain.  Gastrointestinal: Negative for abdominal pain, blood in stool, constipation, diarrhea, nausea and vomiting.  Genitourinary: Negative for difficulty urinating, dysuria and hematuria.  Musculoskeletal: Negative for myalgias.  Skin: Positive for wound (blisters bilateral legs). Negative for rash.  Allergic/Immunologic: Positive for immunocompromised state.  Neurological: Positive for weakness (generalized). Negative for light-headedness and headaches.     Physical Exam Updated Vital Signs BP (!) 145/69   Pulse (!) 51   Temp 99.2 F (37.3 C) (Oral)   Resp (!) 25   Ht 5\' 8"  (1.727 m)   Wt 95.7 kg (211 lb)    SpO2 96%   BMI 32.08 kg/m   Physical Exam  Constitutional: She is oriented to person, place, and time. She appears well-developed and well-nourished. No distress.  NAD. AxO to self, place and time.  HENT:  Head: Normocephalic and atraumatic.  Nose: Nose normal.  Mouth/Throat: No oropharyngeal exudate.  Dry lips and tongue, buccal mucosa moist Normal oropharynx and tonsils  Eyes: Pupils are equal, round, and reactive to light. Conjunctivae and EOM are normal.  Neck: Normal range of motion. Neck supple.  Cardiovascular: Normal rate, regular rhythm and intact distal pulses.   Murmur (systolic) heard. Pulses:      Radial pulses are 2+ on the right side, and 2+ on the left side.       Dorsalis pedis pulses are 2+ on the right side, and 2+ on the left side.  Systolic murmur Bradycardic  1-2+ bilateral pitting edema, symmetric No orthopnea   Pulmonary/Chest: Effort normal. No respiratory distress. She has decreased breath sounds in the right lower field and the left lower field. She has no wheezes. She has no rhonchi. She has no rales. She exhibits no tenderness.  Decreased breath sounds in bilateral bases, no crackles  Abdominal: Soft. Bowel sounds are normal. She exhibits no mass. There is no tenderness.  No obvious distention, tenderness, G/R/R  Musculoskeletal: Normal range of motion. She exhibits no deformity.  Lymphadenopathy:    She has no cervical adenopathy.  Neurological: She is alert and oriented to person, place, and time. No sensory deficit.  Skin: Skin is warm and dry. Capillary refill takes less than 2 seconds.     Large oozing bilster to right anterior lower tibia Larger oozing blister to left posterior lower calf  Psychiatric: She has a normal mood and affect. Her behavior is normal. Judgment and thought content normal.  Nursing note and vitals reviewed.    ED Treatments / Results  Labs (all labs ordered are listed, but only abnormal results are displayed) Labs  Reviewed  CBC WITH DIFFERENTIAL/PLATELET - Abnormal; Notable for the following:       Result Value   RBC 2.94 (*)    Hemoglobin 8.4 (*)    HCT 27.9 (*)    RDW 18.7 (*)    All other components within normal limits  BRAIN NATRIURETIC PEPTIDE - Abnormal; Notable for the following:    B Natriuretic Peptide 2,761.4 (*)    All other components within normal limits  URINALYSIS, ROUTINE W REFLEX MICROSCOPIC - Abnormal; Notable for the following:    Hgb urine dipstick SMALL (*)    Leukocytes, UA LARGE (*)    Bacteria, UA MANY (*)    Squamous Epithelial / LPF 0-5 (*)    All other components within normal limits  RENAL FUNCTION PANEL - Abnormal; Notable for the following:    Potassium 3.1 (*)    Chloride 94 (*)    Glucose, Bld 147 (*)    BUN 104 (*)    Creatinine, Ser 4.71 (*)    Albumin 3.0 (*)    GFR calc non Af Amer 8 (*)    GFR calc Af Amer 10 (*)    All other components within normal limits  PROTIME-INR - Abnormal; Notable for the following:    Prothrombin Time 41.5 (*)    INR 4.38 (*)    All other components within normal limits  I-STAT TROPONIN, ED - Abnormal; Notable for the following:    Troponin i, poc 0.10 (*)    All other components within normal limits    EKG  EKG Interpretation  Date/Time:  Friday December 23 2016 16:15:54 EDT Ventricular Rate:  51 PR Interval:  206 QRS Duration: 122 QT Interval:  610 QTC Calculation: 562 R Axis:   -23 Text Interpretation:   Critical Test Result: Long QTc Sinus bradycardia with sinus arrhythmia Possible Anterolateral infarct , age undetermined Abnormal ECG Since last EKG, rate has decreased Qt has prolonged Confirmed by Duffy Bruce [phone]) on 12/23/2016 4:43:57 PM       Radiology Dg Chest 2 View  Result Date: 12/23/2016 CLINICAL DATA:  Bilateral lower leg and pedal edema. Pleural effusion and weakness. EXAM: CHEST  2 VIEW COMPARISON:  12/07/2016 FINDINGS: Moderate right and small left pleural effusions persist with mild  central vascular congestion. Adjacent compressive atelectasis is noted. Heart size and mediastinal contours are stable to the extent visualized. No acute nor suspicious osseous lesions. Degenerate changes are seen along the lower thoracic spine. IMPRESSION: Right greater than left pleural effusions with central vascular congestion. Findings are similar that of 12/07/2016. Electronically Signed   By: Ashley Royalty M.D.   On: 12/23/2016 17:51    Procedures Procedures (including critical care time)  Medications Ordered in ED Medications - No data to display   Initial Impression / Assessment and Plan / ED Course  I have reviewed the triage vital signs and the nursing notes.  Pertinent labs & imaging results that were available during my care of the patient were reviewed by me and considered in my medical decision making (see chart for details).  Clinical Course as of Dec 23 2105  Fri Dec 23, 2016  1712 Bradycardia and prolonged QTc compared to previous EKG EKG 12-Lead [CG]  1810  IMPRESSION: Right greater than left pleural effusions with central vascular congestion. Findings are similar that of 12/07/2016. DG Chest 2 View [CG]  2019 Potassium: (!) 3.1 [CG]  2019 Creatinine: (!) 4.71 [CG]  2019 GFR, Est African American: (!) 10 [CG]  2019 INR: (!!) 4.38 [CG]  2019 Prothrombin Time: (!) 41.5 [CG]  2019 Troponin i, poc: (!!) 0.10 [CG]  2019 Hemoglobin: (!) 8.4 [CG]  2019 Hgb urine dipstick: (!) SMALL [CG]  2019 Leukocytes, UA: (!) LARGE [CG]  2019 WBC, UA: 6-30 [CG]    Clinical Course User Index [CG] Kinnie Feil, PA-C   Pt sent to ED by nephrologist for fluid excess and elevated creatinine per lab work yesterday. Pt reports generalized fatigue, Lab work consistent with acute on chronic heart failure and acute on chronic renal failure with elevated INR. U/A looks infected, however she does not have any symptoms.   On exam she does look fluid overloaded. CXR consistent with R>L  pleural effusion. Weight 211#, baseline per pt is around 190s. Troponin elevated today, EKG shows slightly prolonged QTc and bradycardia. I think elevated trop from demand ischemia. She has no CP or SOB. Reports feeling more fatigued with exertion only.  Creatinine 5.06 yesterday at nephrology visit, 4.71 today. Baseline appears to be around 2-3. BNP in 2700s, INR elevated 4.38, hgb stable. Spoke to Dr. Myna Hidalgo who will evaluate pt and admit. Will hold off diuresis in ED. Patient, ED treatment and discharge plan was discussed with supervising physician who also evaluated the patient and is agreeable with plan.   Final Clinical Impressions(s) / ED Diagnoses   Final diagnoses:  Acute on chronic combined systolic and diastolic heart failure [facility])    New Prescriptions New Prescriptions   No medications on file     Arlean Hopping 12/23/16 2107    Duffy Bruce, MD 12/25/16 [phone]

## 2016-12-23 NOTE — ED Triage Notes (Signed)
Patient seen by PCP yesterday at [facility], and was called today to go to ED tyo be admitted for excess fluid. Denies SOB, breathing regular, unlabored. Bilateral lower leg and pedal edema. Both lower legs with dressing intact - water filled blisters underneath

## 2016-12-23 NOTE — ED Notes (Signed)
Patient transported to X-ray 

## 2016-12-23 NOTE — ED Notes (Signed)
Patient wanted to know when she would be moved upstairs.  Patient made aware of the bed situation and the probability of spending longer periods of time in the ED.  Patient aware of situation and plan.

## 2016-12-24 DIAGNOSIS — E8770 Fluid overload, unspecified: Secondary | ICD-10-CM

## 2016-12-24 LAB — BASIC METABOLIC PANEL
ANION GAP: 18 — AB (ref 5–15)
BUN: 99 mg/dL — ABNORMAL HIGH (ref 6–20)
CALCIUM: 9.4 mg/dL (ref 8.9–10.3)
CO2: 26 mmol/L (ref 22–32)
Chloride: 92 mmol/L — ABNORMAL LOW (ref 101–111)
Creatinine, Ser: 4.3 mg/dL — ABNORMAL HIGH (ref 0.44–1.00)
GFR calc Af Amer: 11 mL/min — ABNORMAL LOW (ref >60)
GFR, EST NON AFRICAN AMERICAN: 9 mL/min — AB (ref >60)
GLUCOSE: 163 mg/dL — AB (ref 65–99)
Potassium: 3.4 mmol/L — ABNORMAL LOW (ref 3.5–5.1)
SODIUM: 136 mmol/L (ref 135–145)

## 2016-12-24 LAB — GLUCOSE, CAPILLARY
GLUCOSE-CAPILLARY: 180 mg/dL — AB (ref 65–99)
GLUCOSE-CAPILLARY: 204 mg/dL — AB (ref 65–99)
Glucose-Capillary: 147 mg/dL — ABNORMAL HIGH (ref 65–99)
Glucose-Capillary: 89 mg/dL (ref 65–99)

## 2016-12-24 LAB — MRSA PCR SCREENING: MRSA BY PCR: NEGATIVE

## 2016-12-24 LAB — PROTIME-INR
INR: 3.49
Prothrombin Time: 34.8 seconds — ABNORMAL HIGH (ref 11.4–15.2)

## 2016-12-24 MED ORDER — ORAL CARE MOUTH RINSE
15.0000 mL | Freq: Two times a day (BID) | OROMUCOSAL | Status: DC
  Administered 2016-12-24 – 2017-01-16 (×26): 15 mL via OROMUCOSAL

## 2016-12-24 MED ORDER — INFLUENZA VAC SPLIT HIGH-DOSE 0.5 ML IM SUSY
0.5000 mL | PREFILLED_SYRINGE | INTRAMUSCULAR | Status: AC
  Administered 2016-12-25: 0.5 mL via INTRAMUSCULAR
  Filled 2016-12-24: qty 0.5

## 2016-12-24 MED ORDER — ALPRAZOLAM 0.5 MG PO TABS
0.5000 mg | ORAL_TABLET | Freq: Every day | ORAL | Status: DC
  Administered 2016-12-24 – 2017-01-16 (×24): 0.5 mg via ORAL
  Filled 2016-12-24: qty 2
  Filled 2016-12-24: qty 1
  Filled 2016-12-24 (×3): qty 2
  Filled 2016-12-24 (×11): qty 1
  Filled 2016-12-24: qty 2
  Filled 2016-12-24 (×3): qty 1
  Filled 2016-12-24: qty 2
  Filled 2016-12-24 (×5): qty 1

## 2016-12-24 NOTE — Plan of Care (Signed)
Problem: Safety: Goal: Ability to remain free from injury will improve Outcome: Progressing Bed alarm on. Call bell within reach Assist x1/2 No fall this shift  Problem: Education: Goal: Ability to demonstrate managment of disease process will improve Outcome: Progressing Understands the need for lasix and accurate intake and output monitoring.

## 2016-12-24 NOTE — Progress Notes (Signed)
PROGRESS NOTE  [name] OVZ:[id number] DOB: 09/29/1945 DOA: 12/23/2016 PCP: [name], MD  HPI/Recap of past 24 hours:  [name] is a 71 year old female with a history of chronic systolic heart failure with EF 30-35%, CKD stage 4, coronary artery disease, paroxysmal atrial fibrillation on Coumadin, recurrent pleural effusions s/p recent R thoracentesis 11/21/16, insulin-dependent type 2 diabetes, anemia of chronic disease who presented to the ED at [facility] on 12/23/2016 with complaints of generalized fatigue and swelling as recommended by her nephrologist.  Patient was admitted for acute exacerbation of congestive heart failure complicated by fluid overload and worsening renal failure.  Overnight there were no acute events.  This morning the patient reports feeling anxious shortness of breath.  She denies any chest pain or palpitations.  Constitutional: NAD, calm, comfortable Eyes: Lids and conjunctivae normal ENMT: Mucous membranes are moist. Posterior pharynx clear of any exudate or lesions.   Neck: normal, supple, no masses, no thyromegaly Respiratory:  Rhonchi noted bilaterally normal respiratory effort.  Cardiovascular: S1 & S2 heard, regular rate and rhythm.  2+ pitting lower extremity edema bilaterally with watery blisters. Abdomen:  Obese, no tenderness, no masses palpated. Bowel sounds normal.  Musculoskeletal: no clubbing / cyanosis. No joint deformity upper and lower extremities.  Skin: Lower legs wrapped in gauze bilaterally; blisters noted in lower extremities bilaterally. Neurologic: CN 2-12 grossly intact. Sensation intact. Strength 5/5 in all 4 limbs.  Psychiatric: Alert and oriented x 3. Calm, cooperative.     Objective: Vitals:   12/24/16 0110 12/24/16 0402 12/24/16 0530 12/24/16 0533  BP: (!) 154/69  (!) 146/94   Pulse: (!) 57 (!) 54 (!) 59   Resp: 20  18   Temp: 98.1 F (36.7 C)  97.9 F (36.6 C)   TempSrc: Oral  Oral   SpO2: 100% 100% 94% 98%   Weight: 93.4 kg (205 lb 14.4 oz)     Height: 5\' 8"  (1.727 m)       Intake/Output Summary (Last 24 hours) at 12/24/16 0723 Last data filed at 12/24/16 0536  Gross per 24 hour  Intake              943 ml  Output              470 ml  Net              473 ml   Filed Weights   12/23/16 1558 12/24/16 0110  Weight: 95.7 kg (211 lb) 93.4 kg (205 lb 14.4 oz)    Data Reviewed: CBC:  Recent Labs Lab 12/23/16 1855  WBC 4.1  NEUTROABS 3.0  HGB 8.4*  HCT 27.9*  MCV 94.9  PLT 412   Basic Metabolic Panel:  Recent Labs Lab 12/23/16 1855  NA 136  K 3.1*  CL 94*  CO2 28  GLUCOSE 147*  BUN 104*  CREATININE 4.71*  CALCIUM 9.1  PHOS 4.6   GFR: Estimated Creatinine Clearance: 12.9 mL/min (A) (by C-G formula based on SCr of 4.71 mg/dL (H)). Liver Function Tests:  Recent Labs Lab 12/23/16 1855  ALBUMIN 3.0*   No results for input(s): LIPASE, AMYLASE in the last 168 hours. No results for input(s): AMMONIA in the last 168 hours. Coagulation Profile:  Recent Labs Lab 12/23/16 1855  INR 4.38*   Cardiac Enzymes: No results for input(s): CKTOTAL, CKMB, CKMBINDEX, TROPONINI in the last 168 hours. BNP (last 3 results) No results for input(s): PROBNP in the last 8760 hours. HbA1C: No results  for input(s): HGBA1C in the last 72 hours. CBG:  Recent Labs Lab 12/23/16 2254  GLUCAP 154*   Lipid Profile: No results for input(s): CHOL, HDL, LDLCALC, TRIG, CHOLHDL, LDLDIRECT in the last 72 hours. Thyroid Function Tests: No results for input(s): TSH, T4TOTAL, FREET4, T3FREE, THYROIDAB in the last 72 hours. Anemia Panel: No results for input(s): VITAMINB12, FOLATE, FERRITIN, TIBC, IRON, RETICCTPCT in the last 72 hours. Urine analysis:    Component Value Date/Time   COLORURINE YELLOW 12/23/2016 1810   APPEARANCEUR CLEAR 12/23/2016 1810   LABSPEC 1.009 12/23/2016 1810   PHURINE 5.0 12/23/2016 1810   GLUCOSEU NEGATIVE 12/23/2016 1810   HGBUR SMALL (A) 12/23/2016 1810    BILIRUBINUR NEGATIVE 12/23/2016 1810   KETONESUR NEGATIVE 12/23/2016 1810   PROTEINUR NEGATIVE 12/23/2016 1810   UROBILINOGEN 0.2 08/23/2007 2037   NITRITE NEGATIVE 12/23/2016 1810   LEUKOCYTESUR LARGE (A) 12/23/2016 1810   Sepsis Labs: @LABRCNTIP (procalcitonin:4,lacticidven:4)  )No results found for this or any previous visit (from the past 240 hour(s)).    Studies: Dg Chest 2 View  Result Date: 12/23/2016 CLINICAL DATA:  Bilateral lower leg and pedal edema. Pleural effusion and weakness. EXAM: CHEST  2 VIEW COMPARISON:  12/07/2016 FINDINGS: Moderate right and small left pleural effusions persist with mild central vascular congestion. Adjacent compressive atelectasis is noted. Heart size and mediastinal contours are stable to the extent visualized. No acute nor suspicious osseous lesions. Degenerate changes are seen along the lower thoracic spine. IMPRESSION: Right greater than left pleural effusions with central vascular congestion. Findings are similar that of 12/07/2016. Electronically Signed   By: Ashley Royalty M.D.   On: 12/23/2016 17:51    Scheduled Meds: . ALPRAZolam  0.5 mg Oral QHS  . amiodarone  200 mg Oral BID  . clopidogrel  75 mg Oral Daily  . cyclobenzaprine  5 mg Oral QHS  . fluticasone  1 spray Each Nare Daily  . furosemide  60 mg Intravenous Q12H  . hydrALAZINE  50 mg Oral BID  . [START ON 12/25/2016] Influenza vac split quadrivalent PF  0.5 mL Intramuscular Tomorrow-1000  . insulin aspart  0-15 Units Subcutaneous TID WC  . insulin aspart  0-5 Units Subcutaneous QHS  . insulin aspart  10 Units Subcutaneous TID WC  . insulin glargine  30 Units Subcutaneous QHS  . isosorbide mononitrate  30 mg Oral BID  . latanoprost  1 drop Both Eyes QHS  . loratadine  10 mg Oral Daily  . meclizine  12.5 mg Oral BID  . mouth rinse  15 mL Mouth Rinse BID  . metoprolol succinate  50 mg Oral Q breakfast  . pantoprazole  40 mg Oral Daily  . potassium chloride  30 mEq Oral BID  .  rosuvastatin  40 mg Oral q1800  . sodium chloride flush  3 mL Intravenous Q12H    Continuous Infusions: . sodium chloride       LOS: 1 day    Assessment/Plan:  Principal Problem:   Acute on chronic combined systolic and diastolic CHF (congestive heart failure) (HCC) -EF 30-35% -Continue diuresis, fluid restriction, strict I's and O's -Continue cardiac monitoring  AKI on CKD stage IV -Avoid nephrotoxic agents -Repeat BMP in the morning  Fluid overload -Most likely multifactorial secondary to advanced renal failure versus heart failure with reduced ejection fraction -Continue to restrict salt and fluid  Atrial fibrillation -Rate controlled continue amiodarone  Supratherapeutic INR -INR 4.3; no sign of bleeding -Repeat INR in the morning -Continue to hold  off Coumadin  Hypokalemia most likely secondary to chronic diuresis use -Replete as indicated -BMP in the morning  Insulin-dependent type 2 diabetes -Last A1c 8.8% August 2016 -Continue Lantus 30 units nightly NovoLog 10 units 3 times daily insulin sliding scale  Coronary artery disease -Continue metoprolol, Crestor  Anxiety -Continue Xanax 0.5 mg p.o. Nightly  GERD -Continue Protonix 40 mg p.o. daily   Code Status: Full  Family Communication: None  Disposition Plan: Will stay in the midnight continue diuresis and current management   Consultants:  Nephrology  Procedures:  None  Antimicrobials:  Not indicated  DVT prophylaxis: Supratherapeutic INR   Kayleen Memos, MD [facility] Pager [phone]-  If 7PM-7AM, please contact night-coverage [email or web address] Password [email or web address] 12/24/2016, 7:23 AM

## 2016-12-25 DIAGNOSIS — T383X5A Adverse effect of insulin and oral hypoglycemic [antidiabetic] drugs, initial encounter: Secondary | ICD-10-CM

## 2016-12-25 DIAGNOSIS — E160 Drug-induced hypoglycemia without coma: Secondary | ICD-10-CM

## 2016-12-25 LAB — GLUCOSE, CAPILLARY
GLUCOSE-CAPILLARY: 47 mg/dL — AB (ref 65–99)
GLUCOSE-CAPILLARY: 51 mg/dL — AB (ref 65–99)
GLUCOSE-CAPILLARY: 122 mg/dL — AB (ref 65–99)
Glucose-Capillary: 93 mg/dL (ref 65–99)
Glucose-Capillary: 181 mg/dL — ABNORMAL HIGH (ref 65–99)
Glucose-Capillary: 229 mg/dL — ABNORMAL HIGH (ref 65–99)
Glucose-Capillary: 209 mg/dL — ABNORMAL HIGH (ref 65–99)

## 2016-12-25 LAB — CBC WITH DIFFERENTIAL/PLATELET
BASOS PCT: 0 %
Basophils Absolute: 0 10*3/uL (ref 0.0–0.1)
EOS ABS: 0.1 10*3/uL (ref 0.0–0.7)
Eosinophils Relative: 2 %
HCT: 29.2 % — ABNORMAL LOW (ref 36.0–46.0)
HEMOGLOBIN: 8.7 g/dL — AB (ref 12.0–15.0)
Lymphocytes Relative: 12 %
Lymphs Abs: 0.5 10*3/uL — ABNORMAL LOW (ref 0.7–4.0)
MCH: 28.3 pg (ref 26.0–34.0)
MCHC: 29.8 g/dL — AB (ref 30.0–36.0)
MCV: 95.1 fL (ref 78.0–100.0)
Monocytes Absolute: 0.5 10*3/uL (ref 0.1–1.0)
Monocytes Relative: 13 %
NEUTROS PCT: 73 %
Neutro Abs: 3 10*3/uL (ref 1.7–7.7)
Platelets: 208 10*3/uL (ref 150–400)
RBC: 3.07 MIL/uL — AB (ref 3.87–5.11)
RDW: 18.9 % — ABNORMAL HIGH (ref 11.5–15.5)
WBC: 4 10*3/uL (ref 4.0–10.5)

## 2016-12-25 LAB — BASIC METABOLIC PANEL
Anion gap: 13 (ref 5–15)
BUN: 96 mg/dL — AB (ref 6–20)
CO2: 27 mmol/L (ref 22–32)
CREATININE: 3.83 mg/dL — AB (ref 0.44–1.00)
Calcium: 8.9 mg/dL (ref 8.9–10.3)
Chloride: 97 mmol/L — ABNORMAL LOW (ref 101–111)
GFR calc Af Amer: 13 mL/min — ABNORMAL LOW (ref >60)
GFR, EST NON AFRICAN AMERICAN: 11 mL/min — AB (ref >60)
GLUCOSE: 108 mg/dL — AB (ref 65–99)
POTASSIUM: 3.3 mmol/L — AB (ref 3.5–5.1)
SODIUM: 137 mmol/L (ref 135–145)

## 2016-12-25 LAB — PROTIME-INR
INR: 4.98
PROTHROMBIN TIME: 45.9 s — AB (ref 11.4–15.2)

## 2016-12-25 NOTE — Progress Notes (Addendum)
PROGRESS NOTE  [name] ZOX:[id number] DOB: [birth year] DOA: 12/23/2016 PCP: [name] Beard, MD  HPI/Recap of past 24 hours: [name] is a 71 year old female with a history of chronic systolic heart failure with EF 30-35%, CKD stage 4, coronary artery disease, paroxysmal atrial fibrillation on Coumadin, recurrent pleural effusions s/p recent R thoracentesis 11/21/16, insulin-dependent type 2 diabetes, anemia of chronic disease who presented to the ED at [facility] on 12/23/2016 with complaints of generalized fatigue and swelling as recommended by her nephrologist.  Patient was admitted for acute exacerbation of congestive heart failure complicated by fluid overload and worsening renal failure.  Patient had 2 episodes of hypoglycemia which were corrected by RN using hypoglycemic protocol. Admits to poor appetite and oral intake. Home insulin regimen held and continued insulin sliding scale. She denies chest pain, dyspnea, or palpitations.  Constitutional:NAD, calm, comfortable Eyes:Lids and conjunctivae normal ENMT:Mucous membranes are moist. Posterior pharynx clear of any exudate or lesions.  Neck:normal, supple, no masses, no thyromegaly Respiratory: Improved mild rhonchi noted at bases bilaterally with normal respiratory effort.  Cardiovascular:S1 &S2 heard, irregular rate and rhythm.  improving 1+ pitting edema in lower extremities bilaterally with watery blisters. Abdomen: Obese, non tenderness, no masses palpated. Bowel sounds normal.  Musculoskeletal:no clubbing / cyanosis. No joint deformity upper and lower extremities.  Skin:Lower legs wrapped in gauze bilaterally; blisters noted in lower extremities bilaterally. Neurologic:CN 2-12 grossly intact. Sensation intact. Strength 5/5 in all 4 limbs.  Psychiatric:Alert and oriented x 3.  Mood is appropriate for condition.     Objective: Vitals:   12/25/16 0006 12/25/16 0514 12/25/16 1024 12/25/16 1123  BP: (!)  108/55 123/67 131/61 (!) 126/54  Pulse: (!) 50 62 (!) 58 (!) 56  Resp: 18 20  20   Temp: 98.2 F (36.8 C) 98.5 F (36.9 C)  97.8 F (36.6 C)  TempSrc: Oral Oral  Oral  SpO2: 97% 99%  97%  Weight:  92.6 kg (204 lb 3.2 oz)    Height:        Intake/Output Summary (Last 24 hours) at 12/25/16 1804 Last data filed at 12/25/16 1501  Gross per 24 hour  Intake              720 ml  Output             1350 ml  Net             -630 ml   Filed Weights   12/23/16 1558 12/24/16 0110 12/25/16 0514  Weight: 95.7 kg (211 lb) 93.4 kg (205 lb 14.4 oz) 92.6 kg (204 lb 3.2 oz)    Data Reviewed: CBC:  Recent Labs Lab 12/23/16 1855 12/25/16 0654  WBC 4.1 4.0  NEUTROABS 3.0 3.0  HGB 8.4* 8.7*  HCT 27.9* 29.2*  MCV 94.9 95.1  PLT 226 811   Basic Metabolic Panel:  Recent Labs Lab 12/23/16 1855 12/24/16 0805 12/25/16 0654  NA 136 136 137  K 3.1* 3.4* 3.3*  CL 94* 92* 97*  CO2 28 26 27   GLUCOSE 147* 163* 108*  BUN 104* 99* 96*  CREATININE 4.71* 4.30* 3.83*  CALCIUM 9.1 9.4 8.9  PHOS 4.6  --   --    GFR: Estimated Creatinine Clearance: 15.8 mL/min (A) (by C-G formula based on SCr of 3.83 mg/dL (H)). Liver Function Tests:  Recent Labs Lab 12/23/16 1855  ALBUMIN 3.0*   No results for input(s): LIPASE, AMYLASE in the last 168 hours. No results for input(s): AMMONIA in  the last 168 hours. Coagulation Profile:  Recent Labs Lab 12/23/16 1855 12/24/16 0805 12/25/16 0654  INR 4.38* 3.49 4.98*   Cardiac Enzymes: No results for input(s): CKTOTAL, CKMB, CKMBINDEX, TROPONINI in the last 168 hours. BNP (last 3 results) No results for input(s): PROBNP in the last 8760 hours. HbA1C: No results for input(s): HGBA1C in the last 72 hours. CBG:  Recent Labs Lab 12/25/16 0430 12/25/16 0537 12/25/16 0757 12/25/16 1110 12/25/16 1618  GLUCAP 51* 93 122* 181* 229*   Lipid Profile: No results for input(s): CHOL, HDL, LDLCALC, TRIG, CHOLHDL, LDLDIRECT in the last 72  hours. Thyroid Function Tests: No results for input(s): TSH, T4TOTAL, FREET4, T3FREE, THYROIDAB in the last 72 hours. Anemia Panel: No results for input(s): VITAMINB12, FOLATE, FERRITIN, TIBC, IRON, RETICCTPCT in the last 72 hours. Urine analysis:    Component Value Date/Time   COLORURINE YELLOW 12/23/2016 1810   APPEARANCEUR CLEAR 12/23/2016 1810   LABSPEC 1.009 12/23/2016 1810   PHURINE 5.0 12/23/2016 1810   GLUCOSEU NEGATIVE 12/23/2016 1810   HGBUR SMALL (A) 12/23/2016 1810   BILIRUBINUR NEGATIVE 12/23/2016 1810   KETONESUR NEGATIVE 12/23/2016 1810   PROTEINUR NEGATIVE 12/23/2016 1810   UROBILINOGEN 0.2 08/23/2007 2037   NITRITE NEGATIVE 12/23/2016 1810   LEUKOCYTESUR LARGE (A) 12/23/2016 1810   Sepsis Labs: @LABRCNTIP (procalcitonin:4,lacticidven:4)  ) Recent Results (from the past 240 hour(s))  MRSA PCR Screening     Status: None   Collection Time: 12/24/16  1:39 AM  Result Value Ref Range Status   MRSA by PCR NEGATIVE NEGATIVE Final    Comment:        The GeneXpert MRSA Assay (FDA approved for NASAL specimens only), is one component of a comprehensive MRSA colonization surveillance program. It is not intended to diagnose MRSA infection nor to guide or monitor treatment for MRSA infections.       Studies: No results found.  Scheduled Meds: . ALPRAZolam  0.5 mg Oral QHS  . amiodarone  200 mg Oral BID  . clopidogrel  75 mg Oral Daily  . cyclobenzaprine  5 mg Oral QHS  . fluticasone  1 spray Each Nare Daily  . furosemide  60 mg Intravenous Q12H  . hydrALAZINE  50 mg Oral BID  . insulin aspart  0-15 Units Subcutaneous TID WC  . isosorbide mononitrate  30 mg Oral BID  . latanoprost  1 drop Both Eyes QHS  . loratadine  10 mg Oral Daily  . meclizine  12.5 mg Oral BID  . mouth rinse  15 mL Mouth Rinse BID  . metoprolol succinate  50 mg Oral Q breakfast  . pantoprazole  40 mg Oral Daily  . potassium chloride  30 mEq Oral BID  . rosuvastatin  40 mg Oral q1800   . sodium chloride flush  3 mL Intravenous Q12H    Continuous Infusions: . sodium chloride       LOS: 2 days   Assessment/Plan:    Acute on chronic combined systolic and diastolic CHF (congestive heart failure) (HCC) -EF 30-35% -Continue diuresis, fluid restriction, strict I's and O's -Continue cardiac monitoring  AKI on CKD stage IV -Improving cr 3.8 from 4.3 -Avoid nephrotoxic agents -Repeat BMP in the morning  Fluid overload -Most likely multifactorial secondary to advanced renal failure versus heart failure with reduced ejection fraction -Continue to restrict salt and fluid -Continue Lasix  Hypoglycemia -Most likely secondary to poor oral intake -Stop scheduled home dose insulin regimen -Continue insulin sliding scale with hypoglycemia protocol  Atrial fibrillation -Rate controlled continue amiodarone -Supratherapeutic INR (Coumadin) -Repeat INR in the morning  Supratherapeutic INR -No sign of bleeding -Repeat INR in the morning -Continue to hold off Coumadin  Hypokalemia most likely secondary to chronic diuresis use -Replete as indicated -BMP in the morning  Insulin-dependent type 2 diabetes -Last A1c 8.8% August 2016 -Insulin sliding scale with hypoglycemia protocol  Coronary artery disease -Continue metoprolol, Crestor  Anxiety -Continue Xanax 0.5 mg p.o. Nightly  GERD -Continue Protonix 40 mg p.o. daily   Code Status: Full  Family Communication: None  Disposition Plan: Will stay another midnight to continue diuresis and current management.   Consultants:  Nephrology  Procedures:  None  Antimicrobials:  Not indicated   DVT prophylaxis: Supratherapeutic INR   Kayleen Memos, MD [facility] Pager [phone]  If 7PM-7AM, please contact night-coverage [email or web address] Password [email or web address] 12/25/2016, 6:04 PM

## 2016-12-26 ENCOUNTER — Inpatient Hospital Stay (HOSPITAL_COMMUNITY): Payer: BLUE CROSS/BLUE SHIELD

## 2016-12-26 DIAGNOSIS — R0989 Other specified symptoms and signs involving the circulatory and respiratory systems: Secondary | ICD-10-CM

## 2016-12-26 DIAGNOSIS — J90 Pleural effusion, not elsewhere classified: Secondary | ICD-10-CM

## 2016-12-26 DIAGNOSIS — R748 Abnormal levels of other serum enzymes: Secondary | ICD-10-CM

## 2016-12-26 DIAGNOSIS — E1159 Type 2 diabetes mellitus with other circulatory complications: Secondary | ICD-10-CM

## 2016-12-26 DIAGNOSIS — I480 Paroxysmal atrial fibrillation: Secondary | ICD-10-CM

## 2016-12-26 DIAGNOSIS — I5043 Acute on chronic combined systolic (congestive) and diastolic (congestive) heart failure: Secondary | ICD-10-CM

## 2016-12-26 DIAGNOSIS — I25119 Atherosclerotic heart disease of native coronary artery with unspecified angina pectoris: Secondary | ICD-10-CM

## 2016-12-26 DIAGNOSIS — N184 Chronic kidney disease, stage 4 (severe): Secondary | ICD-10-CM

## 2016-12-26 DIAGNOSIS — D631 Anemia in chronic kidney disease: Secondary | ICD-10-CM

## 2016-12-26 DIAGNOSIS — I10 Essential (primary) hypertension: Secondary | ICD-10-CM

## 2016-12-26 DIAGNOSIS — D6832 Hemorrhagic disorder due to extrinsic circulating anticoagulants: Secondary | ICD-10-CM

## 2016-12-26 DIAGNOSIS — N179 Acute kidney failure, unspecified: Secondary | ICD-10-CM

## 2016-12-26 LAB — GLUCOSE, CAPILLARY
GLUCOSE-CAPILLARY: 221 mg/dL — AB (ref 65–99)
GLUCOSE-CAPILLARY: 294 mg/dL — AB (ref 65–99)
GLUCOSE-CAPILLARY: 221 mg/dL — AB (ref 65–99)
Glucose-Capillary: 216 mg/dL — ABNORMAL HIGH (ref 65–99)
Glucose-Capillary: 205 mg/dL — ABNORMAL HIGH (ref 65–99)
Glucose-Capillary: 292 mg/dL — ABNORMAL HIGH (ref 65–99)

## 2016-12-26 LAB — PROTIME-INR
INR: 6.29
INR: 5.15 — AB
PROTHROMBIN TIME: 55.1 s — AB (ref 11.4–15.2)
PROTHROMBIN TIME: 47.1 s — AB (ref 11.4–15.2)

## 2016-12-26 LAB — CBC WITH DIFFERENTIAL/PLATELET
BASOS ABS: 0 10*3/uL (ref 0.0–0.1)
Basophils Relative: 1 %
Eosinophils Absolute: 0.1 10*3/uL (ref 0.0–0.7)
Eosinophils Relative: 2 %
HEMATOCRIT: 28.1 % — AB (ref 36.0–46.0)
HEMOGLOBIN: 8.6 g/dL — AB (ref 12.0–15.0)
LYMPHS ABS: 0.7 10*3/uL (ref 0.7–4.0)
LYMPHS PCT: 18 %
MCH: 29.1 pg (ref 26.0–34.0)
MCHC: 30.6 g/dL (ref 30.0–36.0)
MCV: 94.9 fL (ref 78.0–100.0)
Monocytes Absolute: 0.4 10*3/uL (ref 0.1–1.0)
Monocytes Relative: 11 %
NEUTROS ABS: 2.7 10*3/uL (ref 1.7–7.7)
NEUTROS PCT: 68 %
Platelets: 221 10*3/uL (ref 150–400)
RBC: 2.96 MIL/uL — AB (ref 3.87–5.11)
RDW: 18.9 % — ABNORMAL HIGH (ref 11.5–15.5)
WBC: 3.9 10*3/uL — AB (ref 4.0–10.5)

## 2016-12-26 LAB — BASIC METABOLIC PANEL
ANION GAP: 13 (ref 5–15)
BUN: 91 mg/dL — ABNORMAL HIGH (ref 6–20)
CHLORIDE: 96 mmol/L — AB (ref 101–111)
CO2: 27 mmol/L (ref 22–32)
CREATININE: 3.68 mg/dL — AB (ref 0.44–1.00)
Calcium: 9 mg/dL (ref 8.9–10.3)
GFR, EST AFRICAN AMERICAN: 13 mL/min — AB (ref >60)
GFR, EST NON AFRICAN AMERICAN: 11 mL/min — AB (ref >60)
Glucose, Bld: 220 mg/dL — ABNORMAL HIGH (ref 65–99)
POTASSIUM: 4.3 mmol/L (ref 3.5–5.1)
SODIUM: 136 mmol/L (ref 135–145)

## 2016-12-26 LAB — PROCALCITONIN: Procalcitonin: 0.15 ng/mL

## 2016-12-26 MED ORDER — PHYTONADIONE 5 MG PO TABS
2.5000 mg | ORAL_TABLET | Freq: Once | ORAL | Status: AC
  Administered 2016-12-26: 2.5 mg via ORAL
  Filled 2016-12-26: qty 1

## 2016-12-26 MED ORDER — AMIODARONE HCL 200 MG PO TABS
200.0000 mg | ORAL_TABLET | Freq: Every day | ORAL | Status: DC
  Administered 2016-12-27 – 2017-01-17 (×22): 200 mg via ORAL
  Filled 2016-12-26 (×23): qty 1

## 2016-12-26 MED ORDER — PHYTONADIONE 5 MG PO TABS
5.0000 mg | ORAL_TABLET | Freq: Once | ORAL | Status: AC
  Administered 2016-12-26: 5 mg via ORAL
  Filled 2016-12-26: qty 1

## 2016-12-26 MED ORDER — DARBEPOETIN ALFA 150 MCG/0.3ML IJ SOSY
150.0000 ug | PREFILLED_SYRINGE | INTRAMUSCULAR | Status: DC
  Administered 2016-12-26 – 2017-01-16 (×4): 150 ug via SUBCUTANEOUS
  Filled 2016-12-26 (×6): qty 0.3

## 2016-12-26 MED ORDER — SODIUM CHLORIDE 0.9 % IV SOLN
510.0000 mg | Freq: Once | INTRAVENOUS | Status: AC
  Administered 2016-12-26: 510 mg via INTRAVENOUS
  Filled 2016-12-26: qty 17

## 2016-12-26 MED ORDER — IPRATROPIUM-ALBUTEROL 0.5-2.5 (3) MG/3ML IN SOLN
3.0000 mL | Freq: Four times a day (QID) | RESPIRATORY_TRACT | Status: DC
  Administered 2016-12-26 (×2): 3 mL via RESPIRATORY_TRACT
  Filled 2016-12-26: qty 3

## 2016-12-26 MED ORDER — IPRATROPIUM-ALBUTEROL 0.5-2.5 (3) MG/3ML IN SOLN
3.0000 mL | Freq: Three times a day (TID) | RESPIRATORY_TRACT | Status: DC
  Filled 2016-12-26 (×2): qty 3

## 2016-12-26 MED ORDER — METOPROLOL SUCCINATE ER 25 MG PO TB24
25.0000 mg | ORAL_TABLET | Freq: Every day | ORAL | Status: DC
  Administered 2016-12-27 – 2016-12-31 (×5): 25 mg via ORAL
  Filled 2016-12-26 (×5): qty 1

## 2016-12-26 MED ORDER — FUROSEMIDE 80 MG PO TABS
160.0000 mg | ORAL_TABLET | Freq: Three times a day (TID) | ORAL | Status: DC
  Administered 2016-12-26 – 2017-01-03 (×23): 160 mg via ORAL
  Filled 2016-12-26 (×24): qty 2

## 2016-12-26 NOTE — Consult Note (Signed)
   [facility] Inpatient Consult   12/26/2016  SHARLINE LEHANE 1945-11-06 [id number]   ACO status: Not a beneficiary  Patient discussed in Progression Meeting  Patient evaluated for [place] [facility] services.   Reason:  Not a beneficiary currently attributed to one of the [place].    Membership roster was used to verify non- eligible status.  For questions, please contact:  Natividad Brood, RN BSN [facility] Liaison  [phone] business mobile phone Toll free office [phone]

## 2016-12-26 NOTE — Consult Note (Signed)
Cardiology Consultation:   Patient ID: [id number] FAIRBURN; [id number]; 01-19-1946   Admit date: 12/23/2016 Date of Consult: 12/26/2016  Primary Care Provider: [name], MD Primary Cardiologist: [name] Primary Electrophysiologist: [name]   Patient Profile:   [name] is a 71 y.o. female with a hx of diastolic CHF, paroxysmal atrial fibrillation, CAD with evidence of scar previous infarction,CKD stage 4 with frequent episodes of acute oliguric renal failure, insulin requiring diabetes mellitus,recurrent right pleural effusion who is being seen today for the evaluation of acute HF exacerbation at the request of [name].  History of Present Illness:   She was seen in clinic by [name] where she had worsening fatigue and edema and deterioration in kidney function, referred for admission. She was markedly bradycardic with heart rate of 50. QTc interval was prolonged at 526. Creatinine was up to 4.7 and BUN was 104, but she was actually mildly hypokalemic. BNP was 2761, higher than it was during her previous hospitalization. There was also evidence of excessive anticoagulation with an INR over 4, but without overt bleeding and stable hemoglobin. She did not have chest pain or dyspnea at rest, but was short of breath with minimal activity.  [name] was recently hospitalized in late September for acute heart failure exacerbation, in the setting of moderately depressed left ventricular systolic function (EF 26-71%) and nuclear evidence of large area of inferior-inferolateralscar without significant ischemia. She has moderate to severe atrial dilation and mild pulmonary hypertension.   She was seen in follow-up by [name] on October 10 and was doing well although she still had leg edema and milddyspnea.she was in a rehabilitation facility. She was in sinus rhythm. She was scheduled to see [name] on October 12 to discuss ICD, but it looks like this appointment did not  occur.  Past Medical History:  Diagnosis Date  . Acute renal failure superimposed on stage 4 chronic kidney disease ([place]) 07/04/2015  . Allergic rhinitis   . Anemia   . Anxiety   . CAD in native artery    NSTEMI 07/2015 s/p DES to RCA and posterior PDA, PCI 10/2015 with scoring balloon to 85% ISR of distal RCA), known LAD/Cx disease treated medically  . Carotid artery disease ([place])    Mild bilateral carotid disease (1-39% 07/2015)  . Chronic combined systolic and diastolic CHF (congestive heart failure) ([place])   . CKD (chronic kidney disease), stage IV ([place])   . Constipation   . Essential hypertension   . GERD (gastroesophageal reflux disease)   . History of hysterectomy   . Hyperlipidemia   . Low back pain   . Obesity   . 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 Osteoarthritis   . Overactive bladder   . PVC's (premature ventricular contractions)   . Type 2 diabetes mellitus ([place])     Past Surgical History:  Procedure Laterality Date  . ABDOMINAL HYSTERECTOMY    . BACK SURGERY     multiple  . CARDIAC CATHETERIZATION  04/04/2006   Est EF of 60%  . CARDIAC CATHETERIZATION N/A 07/04/2015   Procedure: Left Heart Cath and Coronary Angiography;  Surgeon: [name] Sine, MD;  Location: [facility];  Service: Cardiovascular;  Laterality: N/A;  . CARDIAC CATHETERIZATION N/A 07/04/2015   Procedure: Coronary Stent Intervention;  Surgeon: [name] Sine, MD;  Location: [facility];  Service: Cardiovascular;  Laterality: N/A;  . CARDIAC  CATHETERIZATION N/A 10/13/2015   Procedure: Left Heart Cath and Coronary Angiography;  Surgeon: [name] Sine, MD;  Location: [facility];  Service: Cardiovascular;  Laterality: N/A;  . CARDIAC CATHETERIZATION N/A 12/02/2015   Procedure: Left Heart Cath and Coronary Angiography;  Surgeon: [name] M Martinique, MD;  Location: [facility];  Service: Cardiovascular;   Laterality: N/A;  . CARPAL TUNNEL RELEASE    . CERVICAL BIOPSY     cervical lymph node biopsies  . COLONOSCOPY  May 2002   Dr. Irving [name] :Followup in 5 years, normal exam  . COLONOSCOPY  2008   Dr. Laural [name]: Very redundant colon with mild melanosis coli, splenic flexure polyp biopsy with acute complaint of benign colon polyp. Recommended ten-year followup  . CORONARY STENT PLACEMENT  04/11/2006   2 -- Taxus stents to the circumflex   . PERIPHERAL VASCULAR CATHETERIZATION Right 07/09/2015   Procedure: Upper Extremity Angiography;  Surgeon: [name] Freestone, MD;  Location: [facility];  Service: Cardiovascular;  Laterality: Right;  . PERIPHERAL VASCULAR CATHETERIZATION Right 07/10/2015   Procedure: RIGHT SUBCLAVIAN ARTERY THROMBECTOMY;  Surgeon: [name] Mitchell, MD;  Location: [facility];  Service: Vascular;  Laterality: Right;  . Tendonitis     bilateral elbow  . TRIGGER FINGER RELEASE       Home Medications:  Prior to Admission medications   Medication Sig Start Date End Date Taking? Authorizing Provider  albuterol (PROVENTIL HFA;VENTOLIN HFA) 108 (90 Base) MCG/ACT inhaler Inhale 1-2 puffs into the lungs every 6 (six) hours as needed for wheezing or shortness of breath. 04/01/16  Yes Nanavati, Ankit, MD  albuterol (PROVENTIL) (2.5 MG/3ML) 0.083% nebulizer solution Take 3 mLs (2.5 mg total) by nebulization every 6 (six) hours as needed for wheezing or shortness of breath. 07/16/16  Yes Elgergawy, Silver Huguenin, MD  ALPRAZolam Duanne Moron) 0.5 MG tablet Take 0.5 mg by mouth at bedtime.   Yes [provider]  amiodarone (PACERONE) 200 MG tablet Take 1 tablet (200 mg total) by mouth 2 (two) times daily. New medication to control your heart rate. 11/27/16  Yes [name] Alberts, MD  cetirizine (ZYRTEC) 10 MG tablet Take 5 mg by mouth daily. 10/24/16  Yes [provider]  clopidogrel (PLAVIX) 75 MG tablet Take 1 tablet (75 mg total) by mouth daily. 12/13/16  Yes [name] Shell, MD    cyclobenzaprine (FLEXERIL) 5 MG tablet Take 5 mg by mouth as needed (lower back pain). osteoporosis with out current pathological fracture for 14 days 10/03/16  Yes [provider]  diazepam (VALIUM) 5 MG tablet Take 1 tablet (5 mg total) by mouth every 12 (twelve) hours as needed for muscle spasms. 10/29/16  Yes Julianne Rice, MD  ferrous sulfate 325 (65 FE) MG tablet Take 325 mg by mouth daily with breakfast.    Yes [provider]  fluticasone (FLONASE) 50 MCG/ACT nasal spray Place 1 spray into both nostrils daily.   Yes [provider]  hydrALAZINE (APRESOLINE) 100 MG tablet Take 0.5 tablets (50 mg total) by mouth 2 (two) times daily. Patient taking differently: Take 25 mg by mouth 2 (two) times daily.  11/27/16  Yes [name] Alberts, MD  insulin glargine (LANTUS) 100 UNIT/ML injection Inject 0.3 mLs (30 Units total) into the skin at bedtime. Patient taking differently: Inject 40 Units into the skin at bedtime.  11/27/16  Yes [name] Alberts, MD  isosorbide mononitrate (IMDUR) 60 MG 24 hr tablet Take 0.5 tablets (30 mg total) by mouth  daily. 11/27/16  Yes [name] Alberts, MD  latanoprost (XALATAN) 0.005 % ophthalmic solution Place 1 drop into both eyes at bedtime.    Yes [provider]  meclizine (ANTIVERT) 25 MG tablet Take 12.5 mg by mouth 2 (two) times daily.    Yes [provider]  metolazone (ZAROXOLYN) 2.5 MG tablet Take one tab by mouth x 3 days only, then only as directed by MD 12/14/16  Yes Herminio Commons, MD  metoprolol succinate (TOPROL-XL) 100 MG 24 hr tablet Take 1 tablet (100 mg total) by mouth daily with breakfast. Take with or immediately following a meal. 11/28/16  Yes [name] Alberts, MD  nitroGLYCERIN (NITROSTAT) 0.4 MG SL tablet Place 1 tablet (0.4 mg total) under the tongue every 5 (five) minutes as needed for chest pain. 10/14/15  Yes Simmons, Brittainy M, PA-C  NOVOLOG FLEXPEN 100 UNIT/ML FlexPen Inject 10 Units into the skin See  admin instructions. With meals hold if glucose is less than 150  Notify MD if glucose is less that 60 and greater than 400 12/15/16  Yes [provider]  pantoprazole (PROTONIX) 40 MG tablet Take 40 mg by mouth daily.    Yes [provider]  rosuvastatin (CRESTOR) 40 MG tablet Take 1 tablet (40 mg total) by mouth at bedtime. 07/12/15  Yes Lily Kocher, MD  torsemide (DEMADEX) 20 MG tablet Take 4 tablets (80 mg total) by mouth 2 (two) times daily. Pt is able to take an additional tablet daily for weight gain greater than 3lbs. Patient taking differently: Take 100 mg by mouth See admin instructions. Take (100 mg) by mouth twice daily - may take an additional tablet (20 mg) daily for weight gain greater than 3lbs. 11/27/16  Yes [name] Alberts, MD  torsemide (DEMADEX) 20 MG tablet Take 20 mg by mouth as needed. Every 24 hours as needed for weight gain greater that 3 lbs   Yes [provider]    Inpatient Medications: Scheduled Meds: . ALPRAZolam  0.5 mg Oral QHS  . amiodarone  200 mg Oral BID  . clopidogrel  75 mg Oral Daily  . cyclobenzaprine  5 mg Oral QHS  . darbepoetin (ARANESP) injection - NON-DIALYSIS  150 mcg Subcutaneous Q Mon-1800  . fluticasone  1 spray Each Nare Daily  . furosemide  60 mg Intravenous Q12H  . hydrALAZINE  50 mg Oral BID  . insulin aspart  0-15 Units Subcutaneous TID WC  . ipratropium-albuterol  3 mL Nebulization TID  . isosorbide mononitrate  30 mg Oral BID  . latanoprost  1 drop Both Eyes QHS  . loratadine  10 mg Oral Daily  . meclizine  12.5 mg Oral BID  . mouth rinse  15 mL Mouth Rinse BID  . metoprolol succinate  50 mg Oral Q breakfast  . pantoprazole  40 mg Oral Daily  . potassium chloride  30 mEq Oral BID  . rosuvastatin  40 mg Oral q1800  . sodium chloride flush  3 mL Intravenous Q12H   Continuous Infusions: . sodium chloride    . ferumoxytol     PRN Meds: sodium chloride, acetaminophen, albuterol, diazepam, ondansetron  (ZOFRAN) IV, sodium chloride flush  Allergies:    Allergies  Allergen Reactions  . Ace Inhibitors Cough  . Amlodipine Other (See Comments)    Edema   . Codeine Rash    Social History:   Social History   Social History  . Marital status: Married    Spouse name: N/A  .  Number of children: 3  . Years of education: N/A   Occupational History  . Not on file.   Social History Main Topics  . Smoking status: Never Smoker  . Smokeless tobacco: Never Used  . Alcohol use No  . Drug use: No  . Sexual activity: Not on file   Other Topics Concern  . Not on file   Social History Narrative  . No narrative on file    Family History:    Family History  Problem Relation Age of Onset  . Diabetes Father   . Hypertension Father   . Stroke Father   . Hypertension Brother   . Aneurysm Brother   . Diabetes Brother   . Colon cancer Neg Hx      ROS:  Please see the history of present illness.  ROS  All other ROS reviewed and negative.     Physical Exam/Data:   Vitals:   12/26/16 0501 12/26/16 0810 12/26/16 0924 12/26/16 1321  BP: 130/60 (!) 133/57    Pulse: (!) 54 (!) 54    Resp: 20     Temp: 98 F (36.7 C)     TempSrc: Oral     SpO2: 98% 97% 98% 95%  Weight: 203 lb 12.8 oz (92.4 kg)     Height:        Intake/Output Summary (Last 24 hours) at 12/26/16 1400 Last data filed at 12/26/16 1004  Gross per 24 hour  Intake              600 ml  Output             1250 ml  Net             -650 ml   Filed Weights   12/24/16 0110 12/25/16 0514 12/26/16 0501  Weight: 205 lb 14.4 oz (93.4 kg) 204 lb 3.2 oz (92.6 kg) 203 lb 12.8 oz (92.4 kg)   Body mass index is 30.99 kg/m.  General:  Well nourished, well developed, in no acute distress HEENT: normal Lymph: no adenopathy Neck: 8-9 cm JVD Endocrine:  No thryomegaly Vascular: No carotid bruits; FA pulses 2+ bilaterally without bruits  Cardiac:  normal S1, S2; RRR; no diastolic murmur , 2/6 systolic ejection murmur heard  broadly throughout the precordium Lungs:  Reduced breath sounds in both bases, especially on the right, no wheezing, rhonchi or rales  Abd: soft, nontender, no hepatomegaly  Ext: 1+ pedal/ankle edema Musculoskeletal:  No deformities, BUE and BLE strength normal and equal Skin: warm and dry  Neuro:  CNs 2-12 intact, no focal abnormalities noted Psych:  Normal affect   EKG:  The EKG was personally reviewed and demonstrates:  Marked sinus bradycardia, otherwise low QRS voltage, delayed R-wave progression across the anterior precordium, nonspecific minor IVCD, prolonged QT interval Telemetry:  Telemetry was personally reviewed and demonstrates:  Mild sinus bradycardia  Relevant CV Studies:  Echo 11/11/2016 - Left ventricle: The cavity size was normal. Wall thickness was   increased in a pattern of moderate LVH. Systolic function was   moderately to severely reduced. The estimated ejection fraction   was in the range of 30% to 35%. Features are consistent with a   pseudonormal left ventricular filling pattern, with concomitant   abnormal relaxation and increased filling pressure (grade 2   diastolic dysfunction). Doppler parameters are consistent with   high ventricular filling pressure. - Regional wall motion abnormality: Hypokinesis of the mid inferior   and basal-mid inferolateral myocardium. -  Aortic valve: Mildly calcified annulus. Trileaflet; mildly   thickened leaflets. Valve area (VTI): 1.57 cm^2. Valve area   (Vmax): 1.57 cm^2. Valve area (Vmean): 1.51 cm^2. - Mitral valve: Mildly calcified annulus. Mildly thickened leaflets   . There was mild regurgitation. - Left atrium: The atrium was severely dilated. - Right atrium: The atrium was moderately dilated. - Pulmonary arteries: Systolic pressure was moderately increased.   PA peak pressure: 44 mm Hg (S).  Nuclear stress test 11/15/2016  There was no ST segment deviation noted during stress. T wave inversions  inferiorly.  Defect 1: There is a large defect of severe severity present in the basal inferolateral, basal anterolateral, mid anterior, mid anteroseptal, mid inferolateral, mid anterolateral, apical anterior, apical inferior, apical lateral and apex location.  This is a high risk study.  Findings consistent with prior myocardial infarction. No ischemic territories.  Nuclear stress EF: 21%.  Laboratory Data:  Chemistry Recent Labs Lab 12/24/16 0805 12/25/16 0654 12/26/16 0522  NA 136 137 136  K 3.4* 3.3* 4.3  CL 92* 97* 96*  CO2 26 27 27   GLUCOSE 163* 108* 220*  BUN 99* 96* 91*  CREATININE 4.30* 3.83* 3.68*  CALCIUM 9.4 8.9 9.0  GFRNONAA 9* 11* 11*  GFRAA 11* 13* 13*  ANIONGAP 18* 13 13     Recent Labs Lab 12/23/16 1855  ALBUMIN 3.0*   Hematology Recent Labs Lab 12/23/16 1855 12/25/16 0654 12/26/16 0522  WBC 4.1 4.0 3.9*  RBC 2.94* 3.07* 2.96*  HGB 8.4* 8.7* 8.6*  HCT 27.9* 29.2* 28.1*  MCV 94.9 95.1 94.9  MCH 28.6 28.3 29.1  MCHC 30.1 29.8* 30.6  RDW 18.7* 18.9* 18.9*  PLT 226 208 221   Cardiac EnzymesNo results for input(s): TROPONINI in the last 168 hours.  Recent Labs Lab 12/23/16 1923  TROPIPOC 0.10*    BNP Recent Labs Lab 12/23/16 1855  BNP 2,761.4*    DDimer No results for input(s): DDIMER in the last 168 hours.  Radiology/Studies:  Dg Chest 2 View  Result Date: 12/23/2016 CLINICAL DATA:  Bilateral lower leg and pedal edema. Pleural effusion and weakness. EXAM: CHEST  2 VIEW COMPARISON:  12/07/2016 FINDINGS: Moderate right and small left pleural effusions persist with mild central vascular congestion. Adjacent compressive atelectasis is noted. Heart size and mediastinal contours are stable to the extent visualized. No acute nor suspicious osseous lesions. Degenerate changes are seen along the lower thoracic spine. IMPRESSION: Right greater than left pleural effusions with central vascular congestion. Findings are similar that of  12/07/2016. Electronically Signed   By: Ashley Royalty M.D.   On: 12/23/2016 17:51   Dg Chest Port 1 View  Result Date: 12/26/2016 CLINICAL DATA:  [place] EXAM: PORTABLE CHEST 1 VIEW COMPARISON:  12/23/2016 FINDINGS: There are layering bilateral pleural effusions, right greater than left, similar to prior study. Bilateral lower lobe airspace opacities, also greater on the right. This could reflect atelectasis or pneumonia. Vascular congestion and mild cardiomegaly. IMPRESSION: Findings similar to prior study with bilateral layering effusions and lower lobe airspace opacities, right greater than left. Cardiomegaly, vascular congestion. Electronically Signed   By: Rolm Baptise M.D.   On: 12/26/2016 09:43    Assessment and Plan:   1. Ac on chr CHF: she has clear evidence of volume overload also worsening renal function. Needs volume removal, but needs to be done very cautiously with monitoring of her renal function. Diuretics per nephrology service 2. Ac on chr renal failure: Oliguric; she has demonstrated in the  past to have renal function that is very dependent on cardiac output and I suspect that the bradycardia related to use of amiodarone and beta blockers as contributing to poor renal function. Will decrease the dose of beta blocker further and might discontinue it altogether if her heart rate remains in the 50s. All in all, she is approaching need for dialysis. Note plans to discuss dialysis access placement 3. AFib: decrease amiodarone to long-term maintenance dose of 200 mg daily. Avoid excessive bradycardia, since this may be contributing to poor renal perfusion 4. LYY:TKPTWS non-STEMI, but nuclear stress test did not show any evidence of ischemic salvageable myocardium. Does not have angina pectoris. No plan for any contrast based procedures. 5. 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 6. 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 No overt bleeding problems. She has received vitamin K. Try to keep INR over 2.0. 7. DM: watch for hypoglycemia with worsening renal function.    For questions or updates, please contact [place] Please consult [email or web address] for contact info under Cardiology/STEMI.   Signed, Sanda Klein, MD  12/26/2016 2:00 PM

## 2016-12-26 NOTE — Progress Notes (Addendum)
PROGRESS NOTE  [name]:[id number] DOB: 1945/03/29 DOA: 12/23/2016 PCP: [name], MD  HPI/Recap of past 24 hours: [name] is a 71 year old female with a history of chronic systolic heart failure with EF 30-35%, CKD stage 4, coronary artery disease, paroxysmal atrial fibrillation on Coumadin, recurrent pleural effusions s/p recent R thoracentesis 11/21/16, insulin-dependent type 2 diabetes, anemia of chronic disease who presented to the ED at [facility] on 12/23/2016 with complaints of generalized fatigue and swelling as recommendedby her nephrologist. Patient was admitted for acute exacerbation of congestive heart failure complicated by fluid overload and worseningrenal failure.  Overnight there were no acute events reported by RN/patient.  This morning the patient reports persistent productive cough with yellow sputum. Denies chest pain, dyspnea, or palpitations.  Continues to be diuresed with improvement of her renal function.  Urine output in the last 24hr. Nephrology consulted.  Constitutional:71 year old African-American female, obese alert oriented x4 sitting up in bed in no acute distress. Eyes:Pupils are round and reactive to light; anicteric sclera ENMT:Mucous membranes are dry.  No oral erythema or exudate noted. Neck:No JVD, supple, no masses, no thyromegaly Respiratory:Improved mild rhonchi noted at bases bilaterally with normal respiratory effort.  Cardiovascular:S1 &S2 heard, irregular rate and rhythm. Lower extremity edema improving 1+ pitting edema with watery blisters. Abdomen:Obese,non tenderness, non distended, no masses palpated. Bowel sounds x4 quadrants.  Musculoskeletal:no clubbing / cyanosis. No joint deformity upper and lower extremities.  Skin:Lower legs wrapped in gauze bilaterally;blisters noted in lower extremities bilaterally. Neurologic:CN 2-12 grossly intact. Sensation intact. Strength 5/5 in all 4 limbs.  Psychiatric:Mood  is appropriate for condition and setting.  Objective: Vitals:   12/25/16 1949 12/25/16 2200 12/26/16 0501 12/26/16 0924  BP: (!) 119/51  130/60   Pulse: (!) 55 60 (!) 54   Resp: 18  20   Temp: 98 F (36.7 C)  98 F (36.7 C)   TempSrc: Oral  Oral   SpO2: 98%  98% 98%  Weight:   92.4 kg (203 lb 12.8 oz)   Height:        Intake/Output Summary (Last 24 hours) at 12/26/16 1009 Last data filed at 12/26/16 0516  Gross per 24 hour  Intake              480 ml  Output             1150 ml  Net             -670 ml   Filed Weights   12/24/16 0110 12/25/16 0514 12/26/16 0501  Weight: 93.4 kg (205 lb 14.4 oz) 92.6 kg (204 lb 3.2 oz) 92.4 kg (203 lb 12.8 oz)     Data Reviewed: CBC:  Recent Labs Lab 12/23/16 1855 12/25/16 0654 12/26/16 0522  WBC 4.1 4.0 3.9*  NEUTROABS 3.0 3.0 2.7  HGB 8.4* 8.7* 8.6*  HCT 27.9* 29.2* 28.1*  MCV 94.9 95.1 94.9  PLT 226 208 409   Basic Metabolic Panel:  Recent Labs Lab 12/23/16 1855 12/24/16 0805 12/25/16 0654 12/26/16 0522  NA 136 136 137 136  K 3.1* 3.4* 3.3* 4.3  CL 94* 92* 97* 96*  CO2 28 26 27 27   GLUCOSE 147* 163* 108* 220*  BUN 104* 99* 96* 91*  CREATININE 4.71* 4.30* 3.83* 3.68*  CALCIUM 9.1 9.4 8.9 9.0  PHOS 4.6  --   --   --    GFR: Estimated Creatinine Clearance: 16.4 mL/min (A) (by C-G formula based on SCr of 3.68 mg/dL (  H)). Liver Function Tests:  Recent Labs Lab 12/23/16 1855  ALBUMIN 3.0*   No results for input(s): LIPASE, AMYLASE in the last 168 hours. No results for input(s): AMMONIA in the last 168 hours. Coagulation Profile:  Recent Labs Lab 12/23/16 1855 12/24/16 0805 12/25/16 0654 12/26/16 0522  INR 4.38* 3.49 4.98* 6.29*   Cardiac Enzymes: No results for input(s): CKTOTAL, CKMB, CKMBINDEX, TROPONINI in the last 168 hours. BNP (last 3 results) No results for input(s): PROBNP in the last 8760 hours. HbA1C: No results for input(s): HGBA1C in the last 72 hours. CBG:  Recent Labs Lab  12/25/16 1618 12/25/16 2108 12/26/16 0336 12/26/16 0523 12/26/16 0736  GLUCAP 229* 209* 216* 205* 221*   Lipid Profile: No results for input(s): CHOL, HDL, LDLCALC, TRIG, CHOLHDL, LDLDIRECT in the last 72 hours. Thyroid Function Tests: No results for input(s): TSH, T4TOTAL, FREET4, T3FREE, THYROIDAB in the last 72 hours. Anemia Panel: No results for input(s): VITAMINB12, FOLATE, FERRITIN, TIBC, IRON, RETICCTPCT in the last 72 hours. Urine analysis:    Component Value Date/Time   COLORURINE YELLOW 12/23/2016 1810   APPEARANCEUR CLEAR 12/23/2016 1810   LABSPEC 1.009 12/23/2016 1810   PHURINE 5.0 12/23/2016 1810   GLUCOSEU NEGATIVE 12/23/2016 1810   HGBUR SMALL (A) 12/23/2016 1810   BILIRUBINUR NEGATIVE 12/23/2016 1810   KETONESUR NEGATIVE 12/23/2016 1810   PROTEINUR NEGATIVE 12/23/2016 1810   UROBILINOGEN 0.2 08/23/2007 2037   NITRITE NEGATIVE 12/23/2016 1810   LEUKOCYTESUR LARGE (A) 12/23/2016 1810   Sepsis Labs: @LABRCNTIP (procalcitonin:4,lacticidven:4)  ) Recent Results (from the past 240 hour(s))  MRSA PCR Screening     Status: None   Collection Time: 12/24/16  1:39 AM  Result Value Ref Range Status   MRSA by PCR NEGATIVE NEGATIVE Final    Comment:        The GeneXpert MRSA Assay (FDA approved for NASAL specimens only), is one component of a comprehensive MRSA colonization surveillance program. It is not intended to diagnose MRSA infection nor to guide or monitor treatment for MRSA infections.       Studies: Dg Chest Port 1 View  Result Date: 12/26/2016 CLINICAL DATA:  Rhonchi EXAM: PORTABLE CHEST 1 VIEW COMPARISON:  12/23/2016 FINDINGS: There are layering bilateral pleural effusions, right greater than left, similar to prior study. Bilateral lower lobe airspace opacities, also greater on the right. This could reflect atelectasis or pneumonia. Vascular congestion and mild cardiomegaly. IMPRESSION: Findings similar to prior study with bilateral layering  effusions and lower lobe airspace opacities, right greater than left. Cardiomegaly, vascular congestion. Electronically Signed   By: Rolm Baptise M.D.   On: 12/26/2016 09:43    Scheduled Meds: . ALPRAZolam  0.5 mg Oral QHS  . amiodarone  200 mg Oral BID  . clopidogrel  75 mg Oral Daily  . cyclobenzaprine  5 mg Oral QHS  . fluticasone  1 spray Each Nare Daily  . furosemide  60 mg Intravenous Q12H  . hydrALAZINE  50 mg Oral BID  . insulin aspart  0-15 Units Subcutaneous TID WC  . ipratropium-albuterol  3 mL Nebulization Q6H  . isosorbide mononitrate  30 mg Oral BID  . latanoprost  1 drop Both Eyes QHS  . loratadine  10 mg Oral Daily  . meclizine  12.5 mg Oral BID  . mouth rinse  15 mL Mouth Rinse BID  . metoprolol succinate  50 mg Oral Q breakfast  . pantoprazole  40 mg Oral Daily  . potassium chloride  30 mEq Oral  BID  . rosuvastatin  40 mg Oral q1800  . sodium chloride flush  3 mL Intravenous Q12H    Continuous Infusions: . sodium chloride       LOS: 3 days   Assessment/Plan:  Acute on chronic heart failure with reduced EF 30-35% -EF 30-35% (11/2016) -Continue diuresis,fluid restriction,strict I's and O's -Lasix 60 mg IV twice daily, Imdur 30 mg twice daily, Toprol-XL 50 mg daily, Crestor 40 mg daily, amiodarone 200 mg twice daily -Continue cardiac monitoring  AKI on CKD stage IV -Improving cr 3.6 from 3.8 from 4.7 on admission -Low urine output 600 cc urine output in 24 hr. -Renal ultrasound ordered -Nephrology consulted; appreciate recommendations -Avoid nephrotoxic agents -Repeat BMP in the morning  Fluid overload -Most likely multifactorial secondary to advanced renal failure versus acute on chronic heart failure with reduced ejection fraction -Continue to restrict salt and fluid -Continue Lasix IV 60 twice daily  Hypoglycemia -Resolved -Continue insulin sliding scale with hypoglycemia protocol  Atrial fibrillation -Rate controlled continue  amiodarone -Supratherapeutic INR (Coumadin) 6.29 -P.o. vitamin K given 5 mg once -Repeat INR at 1500 12/26/16  Supratherapeutic INR -No sign of bleeding -vitamin K 5 mg po once -Continue to hold off Coumadin  Hypokalemia most likely secondary to chronic diuresis use -Resolved -Replete as indicated -BMP in the morning  Insulin-dependent type 2 diabetes -Last A1c 8.8% 10/31/2016 -Insulin sliding scale with hypoglycemia protocol  Coronary artery disease -Continue metoprolol,Crestor, plavix  Anxiety -Continue Xanax 0.5 mg p.o. Nightly  GERD -Continue Protonix 40 mg p.o. Daily  Ambulatory dysfunction -Ambulates with a walker at home -PT to evaluate and treat   Code Status:Full  Family Communication:None  Disposition Plan:Will stay another midnight to continue treatment; cardiology and nephrology consulted; we appreciate recommendations.   Consultants:  Nephrology, cardiology  Procedures:  None  Antimicrobials:  Not indicated   DVT prophylaxis: Supratherapeutic INR   Kayleen Memos, MD [facility] Pager [phone]  If 7PM-7AM, please contact night-coverage [email or web address] Password [email or web address] 12/26/2016, 10:09 AM

## 2016-12-26 NOTE — Progress Notes (Signed)
Critical lab value   INR= 6.29. Dr. [place] Crane made aware.

## 2016-12-26 NOTE — Progress Notes (Signed)
INR 5.15, MD paged.

## 2016-12-26 NOTE — Consult Note (Signed)
Reason for Consult: AKI with CKD Referring Physician: [place] Crane, Mal Amabile  HPI. [name] is an 71 y.o. female. With PMHx HFrEF(30-35%), CKD stage 4, CAD, paroxysmal atrial fibrillation on Coumadin, recurrent, reoccurrent pleural effusion requiring thoracentesis, DM, anemia and anxiety who was admitted on 12/23/2016 with complaint of worsening generalized fatigue, found to have acute on chronic heart failure with fluid overload and worsening renal function.  Patient had multiple hospital admissions because of acute on chronic heart failure, she has very fluctuating creatinine, with baseline between 1.4-1.6 in August 2018, it appears that her creatinine get worse when she is volume overloaded, improved with diuresis. During current admission her creatinine was 4.71,K was 3.1, bicarbonate of 28, she was discharged creatinine of 3.17 approximately 10 days ago. Creatinine improved to 3.68 today with diuresis. Her urine output decreased today and nephrology was consulted.  She was on torsemide 80 mg twice daily with an additional dose of 20 mg if she has a weight gain of more than 3 pound. Patient is using that extra dose almost daily, most of the day's taking 100 mg twice daily.  Patient continued to have complaint of fatigue and lower extremity swelling. She does complaint of intermittent shortness of breath, which she attributes to her anxiety. According to patient she has no difficulty with urination. She denies any lower abdominal pain, burning micturition. According to her she continued to make good amount of urine.   Trend in Creatinine: Creat  Date/Time Value Ref Range Status  10/08/2013 10:24 AM 1.73 (H) 0.50 - 1.10 mg/dL Final  09/23/2013 11:24 AM 2.23 (H) 0.50 - 1.10 mg/dL Final   Creatinine, Ser  Date/Time Value Ref Range Status  12/26/2016 05:22 AM 3.68 (H) 0.44 - 1.00 mg/dL Final  12/25/2016 06:54 AM 3.83 (H) 0.44 - 1.00 mg/dL Final  12/24/2016 08:05 AM 4.30 (H) 0.44 - 1.00 mg/dL  Final  12/23/2016 06:55 PM 4.71 (H) 0.44 - 1.00 mg/dL Final  12/12/2016 04:03 AM 3.17 (H) 0.44 - 1.00 mg/dL Final  12/11/2016 03:12 AM 3.42 (H) 0.44 - 1.00 mg/dL Final  12/10/2016 04:35 AM 3.25 (H) 0.44 - 1.00 mg/dL Final  12/09/2016 05:08 AM 3.51 (H) 0.44 - 1.00 mg/dL Final  12/08/2016 06:08 AM 3.79 (H) 0.44 - 1.00 mg/dL Final  12/07/2016 05:00 AM 4.21 (H) 0.44 - 1.00 mg/dL Final  12/06/2016 03:35 AM 4.65 (H) 0.44 - 1.00 mg/dL Final  12/05/2016 05:33 AM 4.75 (H) 0.44 - 1.00 mg/dL Final  12/04/2016 10:11 AM 4.39 (H) 0.44 - 1.00 mg/dL Final  12/04/2016 12:03 AM 4.28 (H) 0.44 - 1.00 mg/dL Final  11/27/2016 06:33 AM 2.04 (H) 0.44 - 1.00 mg/dL Final  11/26/2016 07:18 AM 2.17 (H) 0.44 - 1.00 mg/dL Final  11/25/2016 06:15 AM 2.30 (H) 0.44 - 1.00 mg/dL Final  11/24/2016 06:09 AM 2.40 (H) 0.44 - 1.00 mg/dL Final  11/23/2016 05:36 AM 2.24 (H) 0.44 - 1.00 mg/dL Final  11/22/2016 09:34 AM 2.41 (H) 0.44 - 1.00 mg/dL Final  11/21/2016 04:52 AM 2.60 (H) 0.44 - 1.00 mg/dL Final  11/20/2016 10:18 AM 2.80 (H) 0.44 - 1.00 mg/dL Final  11/19/2016 08:15 AM 3.00 (H) 0.44 - 1.00 mg/dL Final  11/16/2016 06:13 AM 2.81 (H) 0.44 - 1.00 mg/dL Final  11/15/2016 06:09 AM 2.87 (H) 0.44 - 1.00 mg/dL Final  11/14/2016 04:09 AM 2.95 (H) 0.44 - 1.00 mg/dL Final  11/13/2016 07:36 AM 2.90 (H) 0.44 - 1.00 mg/dL Final  11/12/2016 07:04 AM 2.79 (H) 0.44 - 1.00 mg/dL Final  11/11/2016  05:30 AM 2.62 (H) 0.44 - 1.00 mg/dL Final  11/10/2016 07:02 PM 2.82 (H) 0.44 - 1.00 mg/dL Final  11/01/2016 07:17 AM 1.68 (H) 0.44 - 1.00 mg/dL Final  10/31/2016 05:27 AM 2.06 (H) 0.44 - 1.00 mg/dL Final  08/08/2016 04:22 AM 1.78 (H) 0.44 - 1.00 mg/dL Final  08/07/2016 03:08 PM 2.00 (H) 0.44 - 1.00 mg/dL Final  08/07/2016 02:29 PM 2.12 (H) 0.44 - 1.00 mg/dL Final  08/07/2016 07:07 AM 2.34 (H) 0.44 - 1.00 mg/dL Final  08/07/2016 03:20 AM 2.38 (H) 0.44 - 1.00 mg/dL Final  07/16/2016 04:56 AM 2.26 (H) 0.44 - 1.00 mg/dL Final  07/15/2016 05:02  AM 2.28 (H) 0.44 - 1.00 mg/dL Final  07/14/2016 06:32 AM 2.28 (H) 0.44 - 1.00 mg/dL Final  07/10/2016 05:21 AM 2.36 (H) 0.44 - 1.00 mg/dL Final  07/09/2016 02:40 PM 2.60 (H) 0.44 - 1.00 mg/dL Final  07/09/2016 04:02 AM 2.64 (H) 0.44 - 1.00 mg/dL Final  04/01/2016 01:12 AM 2.33 (H) 0.44 - 1.00 mg/dL Final  03/20/2016 03:33 PM 2.46 (H) 0.44 - 1.00 mg/dL Final  02/18/2016 09:37 AM 2.77 (H) 0.57 - 1.00 mg/dL Final  01/10/2016 05:29 AM 1.57 (H) 0.44 - 1.00 mg/dL Final  12/03/2015 08:55 AM 1.52 (H) 0.44 - 1.00 mg/dL Final  12/02/2015 04:22 AM 1.72 (H) 0.44 - 1.00 mg/dL Final  12/01/2015 04:20 PM 1.96 (H) 0.44 - 1.00 mg/dL Final  12/01/2015 01:56 AM 2.02 (H) 0.44 - 1.00 mg/dL Final  10/14/2015 04:08 AM 1.49 (H) 0.44 - 1.00 mg/dL Final    PMH:   Past Medical History:  Diagnosis Date  . Acute renal failure superimposed on stage 4 chronic kidney disease ([place]) 07/04/2015  . Allergic rhinitis   . Anemia   . Anxiety   . CAD in native artery    NSTEMI 07/2015 s/p DES to RCA and posterior PDA, PCI 10/2015 with scoring balloon to 85% ISR of distal RCA), known LAD/Cx disease treated medically  . Carotid artery disease ([place])    Mild bilateral carotid disease (1-39% 07/2015)  . Chronic combined systolic and diastolic CHF (congestive heart failure) ([place])   . CKD (chronic kidney disease), stage IV ([place])   . Constipation   . Essential hypertension   . GERD (gastroesophageal reflux disease)   . History of hysterectomy   . Hyperlipidemia   . Low back pain   . Obesity   . 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 Osteoarthritis   . Overactive bladder   . PVC's (premature ventricular contractions)   . Type 2 diabetes mellitus ([facility])     PSH:   Past Surgical History:  Procedure Laterality Date  . ABDOMINAL HYSTERECTOMY    . BACK SURGERY     multiple  . CARDIAC CATHETERIZATION  04/04/2006   Est EF of 60%  . CARDIAC  CATHETERIZATION N/A 07/04/2015   Procedure: Left Heart Cath and Coronary Angiography;  Surgeon: Troy Sine, MD;  Location: [facility];  Service: Cardiovascular;  Laterality: N/A;  . CARDIAC CATHETERIZATION N/A 07/04/2015   Procedure: Coronary Stent Intervention;  Surgeon: Troy Sine, MD;  Location: [facility];  Service: Cardiovascular;  Laterality: N/A;  . CARDIAC CATHETERIZATION N/A 10/13/2015   Procedure: Left Heart Cath and Coronary Angiography;  Surgeon: Troy Sine, MD;  Location: [facility];  Service: Cardiovascular;  Laterality: N/A;  . CARDIAC CATHETERIZATION N/A 12/02/2015   Procedure: Left Heart  Cath and Coronary Angiography;  Surgeon: Peter M Martinique, MD;  Location: [facility];  Service: Cardiovascular;  Laterality: N/A;  . CARPAL TUNNEL RELEASE    . CERVICAL BIOPSY     cervical lymph node biopsies  . COLONOSCOPY  May 2002   Dr. Irving Shows :Followup in 5 years, normal exam  . COLONOSCOPY  2008   Dr. Laural Golden: Very redundant colon with mild melanosis coli, splenic flexure polyp biopsy with acute complaint of benign colon polyp. Recommended ten-year followup  . CORONARY STENT PLACEMENT  04/11/2006   2 -- Taxus stents to the circumflex   . PERIPHERAL VASCULAR CATHETERIZATION Right 07/09/2015   Procedure: Upper Extremity Angiography;  Surgeon: Conrad Aitkin, MD;  Location: [facility];  Service: Cardiovascular;  Laterality: Right;  . PERIPHERAL VASCULAR CATHETERIZATION Right 07/10/2015   Procedure: RIGHT SUBCLAVIAN ARTERY THROMBECTOMY;  Surgeon: Serafina Mitchell, MD;  Location: [facility];  Service: Vascular;  Laterality: Right;  . Tendonitis     bilateral elbow  . TRIGGER FINGER RELEASE      Allergies:  Allergies  Allergen Reactions  . Ace Inhibitors Cough  . Amlodipine Other (See Comments)    Edema   . Codeine Rash    Medications:   Prior to Admission medications   Medication Sig Start Date End Date Taking? Authorizing Provider   albuterol (PROVENTIL HFA;VENTOLIN HFA) 108 (90 Base) MCG/ACT inhaler Inhale 1-2 puffs into the lungs every 6 (six) hours as needed for wheezing or shortness of breath. 04/01/16  Yes Nanavati, Ankit, MD  albuterol (PROVENTIL) (2.5 MG/3ML) 0.083% nebulizer solution Take 3 mLs (2.5 mg total) by nebulization every 6 (six) hours as needed for wheezing or shortness of breath. 07/16/16  Yes Elgergawy, Silver Huguenin, MD  ALPRAZolam Duanne Moron) 0.5 MG tablet Take 0.5 mg by mouth at bedtime.   Yes [provider]  amiodarone (PACERONE) 200 MG tablet Take 1 tablet (200 mg total) by mouth 2 (two) times daily. New medication to control your heart rate. 11/27/16  Yes Rexene Alberts, MD  cetirizine (ZYRTEC) 10 MG tablet Take 5 mg by mouth daily. 10/24/16  Yes [provider]  clopidogrel (PLAVIX) 75 MG tablet Take 1 tablet (75 mg total) by mouth daily. 12/13/16  Yes Georgette Shell, MD  cyclobenzaprine (FLEXERIL) 5 MG tablet Take 5 mg by mouth as needed (lower back pain). osteoporosis with out current pathological fracture for 14 days 10/03/16  Yes [provider]  diazepam (VALIUM) 5 MG tablet Take 1 tablet (5 mg total) by mouth every 12 (twelve) hours as needed for muscle spasms. 10/29/16  Yes Julianne Rice, MD  ferrous sulfate 325 (65 FE) MG tablet Take 325 mg by mouth daily with breakfast.    Yes [provider]  fluticasone (FLONASE) 50 MCG/ACT nasal spray Place 1 spray into both nostrils daily.   Yes [provider]  hydrALAZINE (APRESOLINE) 100 MG tablet Take 0.5 tablets (50 mg total) by mouth 2 (two) times daily. Patient taking differently: Take 25 mg by mouth 2 (two) times daily.  11/27/16  Yes Rexene Alberts, MD  insulin glargine (LANTUS) 100 UNIT/ML injection Inject 0.3 mLs (30 Units total) into the skin at bedtime. Patient taking differently: Inject 40 Units into the skin at bedtime.  11/27/16  Yes Rexene Alberts, MD  isosorbide mononitrate (IMDUR) 60 MG 24 hr tablet  Take 0.5 tablets (30 mg total) by mouth daily. 11/27/16  Yes Rexene Alberts, MD  latanoprost (XALATAN) 0.005 % ophthalmic solution  Place 1 drop into both eyes at bedtime.    Yes [provider]  meclizine (ANTIVERT) 25 MG tablet Take 12.5 mg by mouth 2 (two) times daily.    Yes [provider]  metolazone (ZAROXOLYN) 2.5 MG tablet Take one tab by mouth x 3 days only, then only as directed by MD 12/14/16  Yes Herminio Commons, MD  metoprolol succinate (TOPROL-XL) 100 MG 24 hr tablet Take 1 tablet (100 mg total) by mouth daily with breakfast. Take with or immediately following a meal. 11/28/16  Yes Rexene Alberts, MD  nitroGLYCERIN (NITROSTAT) 0.4 MG SL tablet Place 1 tablet (0.4 mg total) under the tongue every 5 (five) minutes as needed for chest pain. 10/14/15  Yes Simmons, Brittainy M, PA-C  NOVOLOG FLEXPEN 100 UNIT/ML FlexPen Inject 10 Units into the skin See admin instructions. With meals hold if glucose is less than 150  Notify MD if glucose is less that 60 and greater than 400 12/15/16  Yes [provider]  pantoprazole (PROTONIX) 40 MG tablet Take 40 mg by mouth daily.    Yes [provider]  rosuvastatin (CRESTOR) 40 MG tablet Take 1 tablet (40 mg total) by mouth at bedtime. 07/12/15  Yes Lily Kocher, MD  torsemide (DEMADEX) 20 MG tablet Take 4 tablets (80 mg total) by mouth 2 (two) times daily. Pt is able to take an additional tablet daily for weight gain greater than 3lbs. Patient taking differently: Take 100 mg by mouth See admin instructions. Take (100 mg) by mouth twice daily - may take an additional tablet (20 mg) daily for weight gain greater than 3lbs. 11/27/16  Yes Rexene Alberts, MD  torsemide (DEMADEX) 20 MG tablet Take 20 mg by mouth as needed. Every 24 hours as needed for weight gain greater that 3 lbs   Yes [provider]    Inpatient medications: . ALPRAZolam  0.5 mg Oral QHS  . amiodarone  200 mg Oral BID  . clopidogrel  75 mg Oral  Daily  . cyclobenzaprine  5 mg Oral QHS  . fluticasone  1 spray Each Nare Daily  . furosemide  60 mg Intravenous Q12H  . hydrALAZINE  50 mg Oral BID  . insulin aspart  0-15 Units Subcutaneous TID WC  . ipratropium-albuterol  3 mL Nebulization Q6H  . isosorbide mononitrate  30 mg Oral BID  . latanoprost  1 drop Both Eyes QHS  . loratadine  10 mg Oral Daily  . meclizine  12.5 mg Oral BID  . mouth rinse  15 mL Mouth Rinse BID  . metoprolol succinate  50 mg Oral Q breakfast  . pantoprazole  40 mg Oral Daily  . potassium chloride  30 mEq Oral BID  . rosuvastatin  40 mg Oral q1800  . sodium chloride flush  3 mL Intravenous Q12H    Discontinued Meds:   Medications Discontinued During This Encounter  Medication Reason  . ALPRAZolam (XANAX) tablet 0.5 mg   . insulin glargine (LANTUS) injection 30 Units   . insulin aspart (novoLOG) injection 0-5 Units   . insulin aspart (novoLOG) injection 10 Units   . amiodarone (PACERONE) 710 MG tablet Duplicate  . warfarin (COUMADIN) 7.5 MG tablet Discontinued by provider  . insulin lispro (HUMALOG KWIKPEN) 100 UNIT/ML KiwkPen Patient Preference    Social History:  reports that she has never smoked. She has never used smokeless tobacco. She reports that she does not drink alcohol or use drugs.  Family History:   Family  History  Problem Relation Age of Onset  . Diabetes Father   . Hypertension Father   . Stroke Father   . Hypertension Brother   . Aneurysm Brother   . Diabetes Brother   . Colon cancer Neg Hx     Pertinent items are noted in HPI. Weight change: -6.4 oz (-0.181 kg)  Intake/Output Summary (Last 24 hours) at 12/26/16 1147 Last data filed at 12/26/16 1004  Gross per 24 hour  Intake              600 ml  Output             1250 ml  Net             -650 ml   BP (!) 133/57 (BP Location: Right Arm)   Pulse (!) 54   Temp 98 F (36.7 C) (Oral)   Resp 20   Ht 5\' 8"  (1.727 m)   Wt 203 lb 12.8 oz (92.4 kg) Comment: scale c  SpO2  98%   BMI 30.99 kg/m  Vitals:   12/25/16 2200 12/26/16 0501 12/26/16 0810 12/26/16 0924  BP:  130/60 (!) 133/57   Pulse: 60 (!) 54 (!) 54   Resp:  20    Temp:  98 F (36.7 C)    TempSrc:  Oral    SpO2:  98% 97% 98%  Weight:  203 lb 12.8 oz (92.4 kg)    Height:          General: Vital signs reviewed.  Patient is well-developed and well-nourished, in no acute distress and cooperative with exam.  Head: Normocephalic and atraumatic.Fundi benign Eyes: EOMI, conjunctivae normal, no scleral icterus.  Cardiovascular: RRR, S1 normal, S2 normal, no murmurs, gallops, or rubs. Gr2/6 SEM Pulmonary/Chest: Bilateral basal crackles with decreased air entry at right base. Decreased bs both bases, ^ dullness to percussion , with rales above Abdominal: Soft, non-tender, non-distended, BS +, no masses, organomegaly, or guarding present. Liver down 5 cm Extremities3+ edema bilaterally up to knees, with signs of venous stasis along with blistering, lower legs were wrapped in clean white bandage. Psychiatric: Normal mood and affect. speech and behavior is normal. Cognition and memory are normal. Labs: Basic Metabolic Panel:  Recent Labs Lab 12/23/16 1855 12/24/16 0805 12/25/16 0654 12/26/16 0522  NA 136 136 137 136  K 3.1* 3.4* 3.3* 4.3  CL 94* 92* 97* 96*  CO2 28 26 27 27   GLUCOSE 147* 163* 108* 220*  BUN 104* 99* 96* 91*  CREATININE 4.71* 4.30* 3.83* 3.68*  ALBUMIN 3.0*  --   --   --   CALCIUM 9.1 9.4 8.9 9.0  PHOS 4.6  --   --   --    Liver Function Tests:  Recent Labs Lab 12/23/16 1855  ALBUMIN 3.0*   No results for input(s): LIPASE, AMYLASE in the last 168 hours. No results for input(s): AMMONIA in the last 168 hours. CBC:  Recent Labs Lab 12/23/16 1855 12/25/16 0654 12/26/16 0522  WBC 4.1 4.0 3.9*  NEUTROABS 3.0 3.0 2.7  HGB 8.4* 8.7* 8.6*  HCT 27.9* 29.2* 28.1*  MCV 94.9 95.1 94.9  PLT 226 208 221   PT/INR: @LABRCNTIP (inr:5) Cardiac Enzymes: )No results for  input(s): CKTOTAL, CKMB, CKMBINDEX, TROPONINI in the last 168 hours. CBG:  Recent Labs Lab 12/25/16 2108 12/26/16 0336 12/26/16 0523 12/26/16 0736 12/26/16 1137  GLUCAP 209* 216* 205* 221* 292*    Iron Studies: No results for input(s): IRON, TIBC, TRANSFERRIN, FERRITIN  in the last 168 hours.  Xrays/Other Studies: Dg Chest Port 1 View  Result Date: 12/26/2016 CLINICAL DATA:  [facility] EXAM: PORTABLE CHEST 1 VIEW COMPARISON:  12/23/2016 FINDINGS: There are layering bilateral pleural effusions, right greater than left, similar to prior study. Bilateral lower lobe airspace opacities, also greater on the right. This could reflect atelectasis or pneumonia. Vascular congestion and mild cardiomegaly. IMPRESSION: Findings similar to prior study with bilateral layering effusions and lower lobe airspace opacities, right greater than left. Cardiomegaly, vascular congestion. Electronically Signed   By: Rolm Baptise M.D.   On: 12/26/2016 09:43     Assessment/Plan: 1. AKI with CKD. She has very fluctuating creatinine during the past couple of month, along with multiple admissions because of acute on chronic heart failure. Her creatinine mostly stays in 2 es approximately one month ago. Most likely due to cardiorenal syndrome and volume overload. In the past her creatinine improved with diuresis. Up until yesterday she was making 1-2L of urine. Since this morning are recorded urine output is 120 mL, might be little incontinent, weight still decreasing,  She is currently on Lasix 60 mg IV twice a day, and diuresing well, current dose is less then her home dose, may be some compliance issues?      - lasix 160mg  po tid      -Check PTH      - Continue monitoring renal function.      - She is getting closer to dialysis.  Discussed status, recurrent CHF, related to low GFR.  May need dialysis sooner than later.  Will consider access very soon, she is coming around to that.     2. HFrEF. Causing volume  overload and also most likely the cause of her recurrent pleural effusion, as past to thoracentesis was consistent with transudate effusion.  3. Anemia. Her iron studies are more consistent with iron deficiency along with some anemia of chronic illness, according to her lab results on 12/06/2016. She is on oral iron supplement. Hemoglobin stable at 8.6 today. -Feraheme  - Aranesp 50 mcq SQ /week  4. P Afib. With supratherapeutic INR. Marland Kitchen Controlled with amiodarone and beta blocker. She was given a dose of vitamin K today, INR being followed up by pharmacy.  5. DM. History of fewer episodes of hypoglycemia, being monitored and treated by primary team with sliding scale. 6 Leg ulcers    Sumayya Amin 12/26/2016, 11:47 AM  I have seen and examined this patient and agree with the plan of care seen, examined , eval, counseled patient. Discussed with resident. Changes made .  Leani Myron L 12/26/2016, 4:05 PM

## 2016-12-27 ENCOUNTER — Encounter (HOSPITAL_COMMUNITY): Payer: BLUE CROSS/BLUE SHIELD

## 2016-12-27 DIAGNOSIS — I2510 Atherosclerotic heart disease of native coronary artery without angina pectoris: Secondary | ICD-10-CM

## 2016-12-27 DIAGNOSIS — N185 Chronic kidney disease, stage 5: Secondary | ICD-10-CM

## 2016-12-27 LAB — BASIC METABOLIC PANEL
ANION GAP: 12 (ref 5–15)
BUN: 85 mg/dL — ABNORMAL HIGH (ref 6–20)
CALCIUM: 9.1 mg/dL (ref 8.9–10.3)
CO2: 27 mmol/L (ref 22–32)
Chloride: 96 mmol/L — ABNORMAL LOW (ref 101–111)
Creatinine, Ser: 3.53 mg/dL — ABNORMAL HIGH (ref 0.44–1.00)
GFR, EST AFRICAN AMERICAN: 14 mL/min — AB (ref >60)
GFR, EST NON AFRICAN AMERICAN: 12 mL/min — AB (ref >60)
Glucose, Bld: 249 mg/dL — ABNORMAL HIGH (ref 65–99)
Potassium: 4.2 mmol/L (ref 3.5–5.1)
Sodium: 135 mmol/L (ref 135–145)

## 2016-12-27 LAB — GLUCOSE, CAPILLARY
GLUCOSE-CAPILLARY: 298 mg/dL — AB (ref 65–99)
GLUCOSE-CAPILLARY: 253 mg/dL — AB (ref 65–99)
Glucose-Capillary: 237 mg/dL — ABNORMAL HIGH (ref 65–99)
Glucose-Capillary: 259 mg/dL — ABNORMAL HIGH (ref 65–99)

## 2016-12-27 LAB — CBC WITH DIFFERENTIAL/PLATELET
BASOS ABS: 0 10*3/uL (ref 0.0–0.1)
Basophils Relative: 1 %
EOS ABS: 0.1 10*3/uL (ref 0.0–0.7)
EOS PCT: 1 %
HCT: 28.3 % — ABNORMAL LOW (ref 36.0–46.0)
Hemoglobin: 8.8 g/dL — ABNORMAL LOW (ref 12.0–15.0)
LYMPHS ABS: 0.8 10*3/uL (ref 0.7–4.0)
LYMPHS PCT: 22 %
MCH: 29.5 pg (ref 26.0–34.0)
MCHC: 31.1 g/dL (ref 30.0–36.0)
MCV: 95 fL (ref 78.0–100.0)
MONO ABS: 0.4 10*3/uL (ref 0.1–1.0)
Monocytes Relative: 11 %
Neutro Abs: 2.3 10*3/uL (ref 1.7–7.7)
Neutrophils Relative %: 65 %
PLATELETS: 214 10*3/uL (ref 150–400)
RBC: 2.98 MIL/uL — AB (ref 3.87–5.11)
RDW: 19.5 % — ABNORMAL HIGH (ref 11.5–15.5)
WBC: 3.5 10*3/uL — AB (ref 4.0–10.5)

## 2016-12-27 LAB — PROTIME-INR
INR: 2.2
INR: 1.66
PROTHROMBIN TIME: 19.5 s — AB (ref 11.4–15.2)
Prothrombin Time: 24.3 seconds — ABNORMAL HIGH (ref 11.4–15.2)

## 2016-12-27 LAB — IRON AND TIBC
Iron: 208 ug/dL — ABNORMAL HIGH (ref 28–170)
SATURATION RATIOS: 66 % — AB (ref 10.4–31.8)
TIBC: 316 ug/dL (ref 250–450)
UIBC: 108 ug/dL

## 2016-12-27 LAB — ALBUMIN: Albumin: 2.9 g/dL — ABNORMAL LOW (ref 3.5–5.0)

## 2016-12-27 LAB — PHOSPHORUS: Phosphorus: 3.6 mg/dL (ref 2.5–4.6)

## 2016-12-27 LAB — FERRITIN: Ferritin: 643 ng/mL — ABNORMAL HIGH (ref 11–307)

## 2016-12-27 MED ORDER — WARFARIN SODIUM 7.5 MG PO TABS
3.7500 mg | ORAL_TABLET | Freq: Once | ORAL | Status: DC
Start: 2016-12-27 — End: 2016-12-27

## 2016-12-27 MED ORDER — WARFARIN - PHARMACIST DOSING INPATIENT: Freq: Every day | Status: DC

## 2016-12-27 MED ORDER — SENNOSIDES-DOCUSATE SODIUM 8.6-50 MG PO TABS
2.0000 | ORAL_TABLET | Freq: Two times a day (BID) | ORAL | Status: DC
  Administered 2016-12-27 – 2017-01-17 (×22): 2 via ORAL
  Filled 2016-12-27 (×33): qty 2

## 2016-12-27 NOTE — Consult Note (Addendum)
Hospital Consult  Reason for Consult:  In need of dialysis access Requesting Physician:  Deterding MRN #:  [id number]  History of Present Illness: This is a 71 y.o. female who states she came to the hospital for having extra fluid.  She has had recurrent pleural effusions requiring thoracentesis.   She has had multiple hospital admissions for acute on chronic heart failure and her renal function has fluctuated.  She has had some intermittent SOB.  She has bandages on her legs and states that the fluid built up and she developed blisters.   She has hx of afib and is on and has been started on coumadin.  Her INR today is 2.2, but was as high as 6.29 yesterday.   She is on insulin for diabetes.  She is on a beta blocker for blood pressure management.  She states she had a heart attack last year.  The pt is on a statin for cholesterol management.  She denies hx of stroke.    She has seen Dr. Trula Slade in the past for thrombectomy of the right subclavian artery on 07/10/15.     Past Medical History:  Diagnosis Date  . Acute renal failure superimposed on stage 4 chronic kidney disease ([place]) 07/04/2015  . Allergic rhinitis   . Anemia   . Anxiety   . CAD in native artery    NSTEMI 07/2015 s/p DES to RCA and posterior PDA, PCI 10/2015 with scoring balloon to 85% ISR of distal RCA), known LAD/Cx disease treated medically  . Carotid artery disease ([place])    Mild bilateral carotid disease (1-39% 07/2015)  . Chronic combined systolic and diastolic CHF (congestive heart failure) ([place])   . CKD (chronic kidney disease), stage IV ([place])   . Constipation   . Essential hypertension   . GERD (gastroesophageal reflux disease)   . History of hysterectomy   . Hyperlipidemia   . Low back pain   . Obesity   . 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 Osteoarthritis   . Overactive bladder   . PVC's (premature ventricular contractions)   . Type  2 diabetes mellitus ([place])     Past Surgical History:  Procedure Laterality Date  . ABDOMINAL HYSTERECTOMY    . BACK SURGERY     multiple  . CARDIAC CATHETERIZATION  04/04/2006   Est EF of 60%  . CARDIAC CATHETERIZATION N/A 07/04/2015   Procedure: Left Heart Cath and Coronary Angiography;  Surgeon: Troy Sine, MD;  Location: [facility];  Service: Cardiovascular;  Laterality: N/A;  . CARDIAC CATHETERIZATION N/A 07/04/2015   Procedure: Coronary Stent Intervention;  Surgeon: Troy Sine, MD;  Location: [facility];  Service: Cardiovascular;  Laterality: N/A;  . CARDIAC CATHETERIZATION N/A 10/13/2015   Procedure: Left Heart Cath and Coronary Angiography;  Surgeon: Troy Sine, MD;  Location: [facility];  Service: Cardiovascular;  Laterality: N/A;  . CARDIAC CATHETERIZATION N/A 12/02/2015   Procedure: Left Heart Cath and Coronary Angiography;  Surgeon: Peter M Martinique, MD;  Location: [facility];  Service: Cardiovascular;  Laterality: N/A;  . CARPAL TUNNEL RELEASE    . CERVICAL BIOPSY     cervical lymph node biopsies  . COLONOSCOPY  May 2002   Dr. Irving Shows :Followup in 5 years, normal exam  . COLONOSCOPY  2008   Dr. Laural Golden: Very redundant colon with mild melanosis coli, splenic flexure polyp biopsy  with acute complaint of benign colon polyp. Recommended ten-year followup  . CORONARY STENT PLACEMENT  04/11/2006   2 -- Taxus stents to the circumflex   . PERIPHERAL VASCULAR CATHETERIZATION Right 07/09/2015   Procedure: Upper Extremity Angiography;  Surgeon: Conrad [place], MD;  Location: [facility];  Service: Cardiovascular;  Laterality: Right;  . PERIPHERAL VASCULAR CATHETERIZATION Right 07/10/2015   Procedure: RIGHT SUBCLAVIAN ARTERY THROMBECTOMY;  Surgeon: Serafina Mitchell, MD;  Location: [facility];  Service: Vascular;  Laterality: Right;  . Tendonitis     bilateral elbow  . TRIGGER FINGER RELEASE      Allergies  Allergen Reactions  . Ace Inhibitors  Cough  . Amlodipine Other (See Comments)    Edema   . Codeine Rash    Prior to Admission medications   Medication Sig Start Date End Date Taking? Authorizing Provider  albuterol (PROVENTIL HFA;VENTOLIN HFA) 108 (90 Base) MCG/ACT inhaler Inhale 1-2 puffs into the lungs every 6 (six) hours as needed for wheezing or shortness of breath. 04/01/16  Yes Nanavati, Ankit, MD  albuterol (PROVENTIL) (2.5 MG/3ML) 0.083% nebulizer solution Take 3 mLs (2.5 mg total) by nebulization every 6 (six) hours as needed for wheezing or shortness of breath. 07/16/16  Yes Elgergawy, Silver Huguenin, MD  ALPRAZolam Duanne Moron) 0.5 MG tablet Take 0.5 mg by mouth at bedtime.   Yes [provider]  amiodarone (PACERONE) 200 MG tablet Take 1 tablet (200 mg total) by mouth 2 (two) times daily. New medication to control your heart rate. 11/27/16  Yes Rexene Alberts, MD  cetirizine (ZYRTEC) 10 MG tablet Take 5 mg by mouth daily. 10/24/16  Yes [provider]  clopidogrel (PLAVIX) 75 MG tablet Take 1 tablet (75 mg total) by mouth daily. 12/13/16  Yes Georgette Shell, MD  cyclobenzaprine (FLEXERIL) 5 MG tablet Take 5 mg by mouth as needed (lower back pain). osteoporosis with out current pathological fracture for 14 days 10/03/16  Yes [provider]  diazepam (VALIUM) 5 MG tablet Take 1 tablet (5 mg total) by mouth every 12 (twelve) hours as needed for muscle spasms. 10/29/16  Yes Julianne Rice, MD  ferrous sulfate 325 (65 FE) MG tablet Take 325 mg by mouth daily with breakfast.    Yes [provider]  fluticasone (FLONASE) 50 MCG/ACT nasal spray Place 1 spray into both nostrils daily.   Yes [provider]  hydrALAZINE (APRESOLINE) 100 MG tablet Take 0.5 tablets (50 mg total) by mouth 2 (two) times daily. Patient taking differently: Take 25 mg by mouth 2 (two) times daily.  11/27/16  Yes Rexene Alberts, MD  insulin glargine (LANTUS) 100 UNIT/ML injection Inject 0.3 mLs (30 Units total) into the  skin at bedtime. Patient taking differently: Inject 40 Units into the skin at bedtime.  11/27/16  Yes Rexene Alberts, MD  isosorbide mononitrate (IMDUR) 60 MG 24 hr tablet Take 0.5 tablets (30 mg total) by mouth daily. 11/27/16  Yes Rexene Alberts, MD  latanoprost (XALATAN) 0.005 % ophthalmic solution Place 1 drop into both eyes at bedtime.    Yes [provider]  meclizine (ANTIVERT) 25 MG tablet Take 12.5 mg by mouth 2 (two) times daily.    Yes [provider]  metolazone (ZAROXOLYN) 2.5 MG tablet Take one tab by mouth x 3 days only, then only as directed by MD 12/14/16  Yes Herminio Commons, MD  metoprolol succinate (TOPROL-XL) 100 MG 24 hr tablet Take 1 tablet (100 mg total) by mouth daily  with breakfast. Take with or immediately following a meal. 11/28/16  Yes Rexene Alberts, MD  nitroGLYCERIN (NITROSTAT) 0.4 MG SL tablet Place 1 tablet (0.4 mg total) under the tongue every 5 (five) minutes as needed for chest pain. 10/14/15  Yes Simmons, Brittainy M, PA-C  NOVOLOG FLEXPEN 100 UNIT/ML FlexPen Inject 10 Units into the skin See admin instructions. With meals hold if glucose is less than 150  Notify MD if glucose is less that 60 and greater than 400 12/15/16  Yes [provider]  pantoprazole (PROTONIX) 40 MG tablet Take 40 mg by mouth daily.    Yes [provider]  rosuvastatin (CRESTOR) 40 MG tablet Take 1 tablet (40 mg total) by mouth at bedtime. 07/12/15  Yes Lily Kocher, MD  torsemide (DEMADEX) 20 MG tablet Take 4 tablets (80 mg total) by mouth 2 (two) times daily. Pt is able to take an additional tablet daily for weight gain greater than 3lbs. Patient taking differently: Take 100 mg by mouth See admin instructions. Take (100 mg) by mouth twice daily - may take an additional tablet (20 mg) daily for weight gain greater than 3lbs. 11/27/16  Yes Rexene Alberts, MD  torsemide (DEMADEX) 20 MG tablet Take 20 mg by mouth as needed. Every 24 hours as needed for weight  gain greater that 3 lbs   Yes [provider]    Social History   Social History  . Marital status: Married    Spouse name: N/A  . Number of children: 3  . Years of education: N/A   Occupational History  . Not on file.   Social History Main Topics  . Smoking status: Never Smoker  . Smokeless tobacco: Never Used  . Alcohol use No  . Drug use: No  . Sexual activity: Not on file   Other Topics Concern  . Not on file   Social History Narrative  . No narrative on file     Family History  Problem Relation Age of Onset  . Diabetes Father   . Hypertension Father   . Stroke Father   . Hypertension Brother   . Aneurysm Brother   . Diabetes Brother   . Colon cancer Neg Hx     ROS: [x]  Positive   [ ]  Negative   [ ]  All sytems reviewed and are negative  Cardiac: []  chest pain/pressure []  palpitations []  SOB lying flat []  DOE  Vascular: []  pain in legs while walking []  pain in legs at rest []  pain in legs at night []  non-healing ulcers []  hx of DVT []  swelling in legs  Pulmonary: []  productive cough []  asthma/wheezing []  home O2  Neurologic: []  weakness in []  arms []  legs []  numbness in []  arms []  legs []  hx of CVA []  mini stroke [] difficulty speaking or slurred speech []  temporary loss of vision in one eye []  dizziness  Hematologic: []  hx of cancer []  bleeding problems []  problems with blood clotting easily [x]  anemia of chronic disease  Endocrine:   []  diabetes []  thyroid disease  GI []  vomiting blood []  blood in stool [x]  GERD  GU: [x]  CKD/renal failure []  HD--[]  M/W/F or []  T/T/S []  burning with urination []  blood in urine  Psychiatric: [x]  anxiety []  depression  Musculoskeletal: [x]  arthritis []  joint pain  Integumentary: []  rashes []  ulcers [x]  wound BLE  Constitutional: []  fever []  chills   Physical Examination  Vitals:   12/26/16 2053 12/27/16 0550  BP: 140/61 (!) 128/53  Pulse: Marland Kitchen)  55 (!) 55  Resp: 20 20    Temp: 98.2 F (36.8 C) 98.1 F (36.7 C)  SpO2: 100% 99%   Body mass index is 31.22 kg/m.  General:  WDWN in NAD Gait: Not observed HENT: WNL, normocephalic Pulmonary: normal non-labored breathing, without Rales, rhonchi,  wheezing Cardiac: irregular, without  Murmurs, rubs or gallops; without carotid bruits Abdomen:  soft, NT/ND, no masses Skin: without rashes Vascular Exam/Pulses:  Right Left  Radial 2+ (normal) 2+ (normal)  Ulnar Unable to palpate  Unable to palpate   Popliteal Unable to palpate  Unable to palpate   DP Difficult to palpate due to edema Difficult to palpate due to edema  PT Difficult to palpate due to edema Difficult to palpate due to edema   Extremities: without ischemic changes, without Gangrene , without cellulitis Musculoskeletal: no muscle wasting or atrophy  Neurologic: A&O X 3;  No focal weakness or paresthesias are detected; speech is fluent/normal Psychiatric:  The pt has flat affect.   CBC    Component Value Date/Time   WBC 3.5 (L) 12/27/2016 0524   RBC 2.98 (L) 12/27/2016 0524   HGB 8.8 (L) 12/27/2016 0524   HCT 28.3 (L) 12/27/2016 0524   PLT 214 12/27/2016 0524   MCV 95.0 12/27/2016 0524   MCH 29.5 12/27/2016 0524   MCHC 31.1 12/27/2016 0524   RDW 19.5 (H) 12/27/2016 0524   LYMPHSABS 0.8 12/27/2016 0524   MONOABS 0.4 12/27/2016 0524   EOSABS 0.1 12/27/2016 0524   BASOSABS 0.0 12/27/2016 0524    BMET    Component Value Date/Time   NA 135 12/27/2016 0524   NA 141 02/18/2016 0937   K 4.2 12/27/2016 0524   CL 96 (L) 12/27/2016 0524   CO2 27 12/27/2016 0524   GLUCOSE 249 (H) 12/27/2016 0524   BUN 85 (H) 12/27/2016 0524   BUN 53 (H) 02/18/2016 0937   CREATININE 3.53 (H) 12/27/2016 0524   CREATININE 1.73 (H) 10/08/2013 1024   CALCIUM 9.1 12/27/2016 0524   GFRNONAA 12 (L) 12/27/2016 0524   GFRAA 14 (L) 12/27/2016 0524    COAGS: Lab Results  Component Value Date   INR 2.20 12/27/2016   INR 5.15 (HH) 12/26/2016   INR 6.29  (HH) 12/26/2016     Non-Invasive Vascular Imaging:   Will order vein mapping  Statin:  Yes.   Beta Blocker:  Yes.   Aspirin:  No. ACEI:  No. ARB:  No. CCB use:  No Other antiplatelets/anticoagulants:  Yes.   Coumadin  ASSESSMENT/PLAN: This is a 71 y.o. female with AKI with CKD in need of permanent dialysis access.   -pt is right hand dominant.  Will order vein mapping an determine best access for pt.  Will restrict left arm.  -no dialysis at this point, but may need this sooner rather than later.  -she is on coumadin and would benefit from having this done this hospitalization if INR < 1.7 -pt will need to be off her coumadin to proceed with access.   Leontine Locket, PA-C [facility] [phone]  I have interviewed the patient and examined the patient. I agree with the findings by the PA.She is tentatively scheduled for a left AVF on Thursday.  She would like to hold off on vein map until she discusses this with her family. Hopefully this can be done tomorrow so that we can proceed with HD access on Thursday if INR is down. (Her INR is 2.2 today.) I have discussed placement  of AVF with patient including the potential risks involved.   Gae Gallop, MD [phone]

## 2016-12-27 NOTE — Progress Notes (Signed)
Progress Note  Patient Name: [name] Date of Encounter: 12/27/2016  Primary Cardiologist: Dr. Bronson Ing  Subjective   Reports breathing is at baseline. Still has lower extremity edema. No chest pain or palpitations. Output > 500 cc this morning.   Inpatient Medications    Scheduled Meds: . ALPRAZolam  0.5 mg Oral QHS  . amiodarone  200 mg Oral Daily  . clopidogrel  75 mg Oral Daily  . cyclobenzaprine  5 mg Oral QHS  . darbepoetin (ARANESP) injection - NON-DIALYSIS  150 mcg Subcutaneous Q Mon-1800  . fluticasone  1 spray Each Nare Daily  . furosemide  160 mg Oral TID  . hydrALAZINE  50 mg Oral BID  . insulin aspart  0-15 Units Subcutaneous TID WC  . isosorbide mononitrate  30 mg Oral BID  . latanoprost  1 drop Both Eyes QHS  . loratadine  10 mg Oral Daily  . meclizine  12.5 mg Oral BID  . mouth rinse  15 mL Mouth Rinse BID  . metoprolol succinate  25 mg Oral Q breakfast  . pantoprazole  40 mg Oral Daily  . potassium chloride  30 mEq Oral BID  . rosuvastatin  40 mg Oral q1800  . senna-docusate  2 tablet Oral BID  . sodium chloride flush  3 mL Intravenous Q12H   Continuous Infusions: . sodium chloride     PRN Meds: sodium chloride, acetaminophen, albuterol, diazepam, ondansetron (ZOFRAN) IV, sodium chloride flush   Vital Signs    Vitals:   12/26/16 1321 12/26/16 1535 12/26/16 2053 12/27/16 0550  BP:  125/66 140/61 (!) 128/53  Pulse:  (!) 53 (!) 55 (!) 55  Resp:   20 20  Temp:  98 F (36.7 C) 98.2 F (36.8 C) 98.1 F (36.7 C)  TempSrc:  Oral Oral Oral  SpO2: 95% 100% 100% 99%  Weight:    205 lb 4.8 oz (93.1 kg)  Height:        Intake/Output Summary (Last 24 hours) at 12/27/16 1118 Last data filed at 12/27/16 0903  Gross per 24 hour  Intake              597 ml  Output              950 ml  Net             -353 ml   Filed Weights   12/25/16 0514 12/26/16 0501 12/27/16 0550  Weight: 204 lb 3.2 oz (92.6 kg) 203 lb 12.8 oz (92.4 kg) 205 lb 4.8  oz (93.1 kg)    Telemetry    Sinus bradycardia, HR in mid-50's to 60's.  - Personally Reviewed  ECG    No new tracings.   Physical Exam   General: Well developed, well nourished African American female appearing in no acute distress. Head: Normocephalic, atraumatic.  Neck: Supple without bruits, JVD at 8cm. Lungs:  Resp regular and unlabored, CTA. Heart: RRR, S1, S2, no S3, S4, or murmur; no rub. Abdomen: Soft, non-tender, non-distended with normoactive bowel sounds. No hepatomegaly. No rebound/guarding. No obvious abdominal masses. Extremities: No clubbing or cyanosis, 1+ pitting edema up to knees bilaterally. Distal pedal pulses are 2+ bilaterally. Dressings in place along lower extremities.  Neuro: Alert and oriented X 3. Moves all extremities spontaneously. Psych: Normal affect.  Labs    Chemistry Recent Labs Lab 12/23/16 1855  12/25/16 0654 12/26/16 0522 12/27/16 0524  NA 136  < > 137 136 135  K 3.1*  < >  3.3* 4.3 4.2  CL 94*  < > 97* 96* 96*  CO2 28  < > 27 27 27   GLUCOSE 147*  < > 108* 220* 249*  BUN 104*  < > 96* 91* 85*  CREATININE 4.71*  < > 3.83* 3.68* 3.53*  CALCIUM 9.1  < > 8.9 9.0 9.1  ALBUMIN 3.0*  --   --   --  2.9*  GFRNONAA 8*  < > 11* 11* 12*  GFRAA 10*  < > 13* 13* 14*  ANIONGAP 14  < > 13 13 12   < > = values in this interval not displayed.   Hematology Recent Labs Lab 12/25/16 0654 12/26/16 0522 12/27/16 0524  WBC 4.0 3.9* 3.5*  RBC 3.07* 2.96* 2.98*  HGB 8.7* 8.6* 8.8*  HCT 29.2* 28.1* 28.3*  MCV 95.1 94.9 95.0  MCH 28.3 29.1 29.5  MCHC 29.8* 30.6 31.1  RDW 18.9* 18.9* 19.5*  PLT 208 221 214    Cardiac EnzymesNo results for input(s): TROPONINI in the last 168 hours.  Recent Labs Lab 12/23/16 1923  TROPIPOC 0.10*     BNP Recent Labs Lab 12/23/16 1855  BNP 2,761.4*     DDimer No results for input(s): DDIMER in the last 168 hours.   Radiology    US Renal  Result Date: 12/26/2016 CLINICAL DATA:  Acute onset of renal  insufficiency. Initial encounter. EXAM: RENAL / URINARY TRACT ULTRASOUND COMPLETE COMPARISON:  Renal ultrasound performed 12/12/2014, and CT of the abdomen and pelvis from 03/20/2016 FINDINGS: Right Kidney: Length: 11.1 cm. Echogenicity within normal limits. No mass or hydronephrosis visualized. Left Kidney: Length: 10.0 cm. Echogenicity within normal limits. Left renal stones measure up to 6 mm in size. No mass or hydronephrosis visualized. Bladder: Decompressed and not well characterized. IMPRESSION: 1. No evidence of hydronephrosis. 2. Nonobstructing left renal stones measure up to 6 mm in size. Electronically Signed   By: Garald Balding M.D.   On: 12/26/2016 21:50   Dg Chest Port 1 View  Result Date: 12/26/2016 CLINICAL DATA:  [facility] EXAM: PORTABLE CHEST 1 VIEW COMPARISON:  12/23/2016 FINDINGS: There are layering bilateral pleural effusions, right greater than left, similar to prior study. Bilateral lower lobe airspace opacities, also greater on the right. This could reflect atelectasis or pneumonia. Vascular congestion and mild cardiomegaly. IMPRESSION: Findings similar to prior study with bilateral layering effusions and lower lobe airspace opacities, right greater than left. Cardiomegaly, vascular congestion. Electronically Signed   By: Rolm Baptise M.D.   On: 12/26/2016 09:43    Cardiac Studies   Echocardiogram: 11/2016 Study Conclusions  - Left ventricle: The cavity size was normal. Wall thickness was   increased in a pattern of moderate LVH. Systolic function was   moderately to severely reduced. The estimated ejection fraction   was in the range of 30% to 35%. Features are consistent with a   pseudonormal left ventricular filling pattern, with concomitant   abnormal relaxation and increased filling pressure (grade 2   diastolic dysfunction). Doppler parameters are consistent with   high ventricular filling pressure. - Regional wall motion abnormality: Hypokinesis of the mid  inferior   and basal-mid inferolateral myocardium. - Aortic valve: Mildly calcified annulus. Trileaflet; mildly   thickened leaflets. Valve area (VTI): 1.57 cm^2. Valve area   (Vmax): 1.57 cm^2. Valve area (Vmean): 1.51 cm^2. - Mitral valve: Mildly calcified annulus. Mildly thickened leaflets   . There was mild regurgitation. - Left atrium: The atrium was severely dilated. - Right atrium: The  atrium was moderately dilated. - Pulmonary arteries: Systolic pressure was moderately increased.   PA peak pressure: 44 mm Hg (S).   Patient Profile     71 y.o. female PMH of  CAD (s/p NSTEMI in 06/2015 with DES to PDA and RCA at that time, subtotal occlusion of LCX treated medically; repeat cath 9/2017with stable disease; inferolateral scar w/o ischemia),chronic combined systolic and diastolic CHF (EF 28-31% by echo in 11/2016), paroxysmal atrial fibrillation, right subclavian stenosis (s/p intervention), IDDM, HTN, HLD, and CKD Stage IV admitted on 12/23/2016 for worsening edema and fatigue.   Assessment & Plan   1. Acute on Chronic Combined Systolic and Diastolic CHF - BNP elevated at 2761 on admission. she has clear evidence of volume overload also worsening renal function. Needs volume removal, but needs to be done very cautiously with monitoring of her renal function.  - she is currently on PO Lasix 160mg  TID with a recorded net output of -850 mL but over 500 cc not yet recorded. Weight is down 6 lbs since admission (211 lbs --> 205 lbs). Diuretic dosing per Nephrology.  - not on ACE-I/ARB/ARNI secondary to CKD. Continue Toprol-XL 25mg  daily, Imdur 30mg  BID, and Hydralazine 50mg  BID.   2. Acute on Chronic Stage 4 CKD  - Oliguric; she has demonstrated in the past to have renal function that is very dependent on cardiac output and it is thought her bradycardia is contributing to poor renal function. Amiodarone reduced from 200mg  BID to 200mg  once daily and Toprol-XL reduced from 50mg  daily to 25mg   daily.   3. Paroxysmal Atrial Fibrillation  - maintaining SR by review of telemetry. Amiodarone and Toprol-XL reduced as above.  - she is on Coumadin for anticoagulation. Was supra-therapeutic on admission, INR at 2.20 this AM. Will consult Pharmacy to assist with Coumadin dosing.    4. CAD - recent NSTEMI, but nuclear stress test did not show any evidence of ischemic salvageable myocardium. Does not have angina pectoris. No plan for any contrast based procedures. - continue Plavix, BB, Imdur, and statin therapy.   5. 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 No overt bleeding problems. She has received vitamin K. INR at 2.20 this morning.   7. IDDM - per admitting team   Signed, Erma Heritage , PA-C 11:18 AM 12/27/2016 Pager: [phone]  I have seen and examined the patient along with Erma Heritage , PA-C.  I have reviewed the chart, notes and new data.  I agree with PA's note.  Key new complaints: no dyspnea at rest Key examination changes: JVP still elevated, remains mildly bradycardic Key new findings / data: INR rapidly down to 2.2.   PLAN: Will wait to see what HR does on lower beta blocker dose. Ideally will keep HR 60-80 to allow for adequate kidney perfusion. Suspect INR will be subtherapeutic tomorrow and may take a few days to bring back up since she received vit K.  Sanda Klein, MD, [place] [phone] 12/27/2016, 12:18 PM

## 2016-12-27 NOTE — Clinical Social Work Note (Signed)
Clinical Social Work Assessment  Patient Details  Name: [name] MRN: [id number] Date of Birth: 03-30-1945  Date of referral:  12/27/16               Reason for consult:  Discharge Planning                Permission sought to share information with:  Chartered certified accountant granted to share information::  Yes, Verbal Permission Granted  Name::        Agency::  [facility]  Relationship::     Contact Information:     Housing/Transportation Living arrangements for the past 2 months:  [place], [place] of Information:  Patient, Medical Team, Facility Patient Interpreter Needed:  None Criminal Activity/Legal Involvement Pertinent to Current Situation/Hospitalization:  No - Comment as needed Significant Relationships:  Spouse, Adult Children Lives with:  Adult Children Do you feel safe going back to the place where you live?  Yes Need for family participation in patient care:  Yes (Comment)  Care giving concerns:  Patient is a short-term rehab resident from [facility] in [place], [place].  Social Worker assessment / plan:  CSW met with patient. No supports at bedside. CSW introduced role and explained that discharge planning would be discussed. Patient confirmed that she was admitted from [facility] and plans to return once stable for discharge. PT/OT evaluations pending. Will need insurance authorization before she can return. No further concerns. CSW encouraged patient to contact CSW as needed. CSW will continue to follow patient for support and facilitate discharge back to SNF once medically stable.  Employment status:  Retired Forensic scientist:  Other (Comment Required) Nurse, mental health) PT Recommendations:  Not assessed at this time Information / Referral to community resources:  [place]  Patient/Family's Response to care:  Patient agreeable to return to SNF. Patient's family supportive and involved in  patient's care. Patient appreciated social work intervention.  Patient/Family's Understanding of and Emotional Response to Diagnosis, Current Treatment, and Prognosis:  Patient has a good understanding of the reason for admission and need for PT/OT evaluations for return to SNF. Patient appears happy with hospital care.  Emotional Assessment Appearance:  Appears stated age Attitude/Demeanor/Rapport:  Other (Pleasant) Affect (typically observed):  Accepting, Appropriate, Calm, Pleasant Orientation:  Oriented to Self, Oriented to Place, Oriented to  Time, Oriented to Situation Alcohol / Substance use:  Never Used Psych involvement (Current and /or in the community):  No (Comment)  Discharge Needs  Concerns to be addressed:  Care Coordination Readmission within the last 30 days:  Yes Current discharge risk:  Dependent with Mobility Barriers to Discharge:  Continued Medical Work up, [place], LCSW 12/27/2016, 10:26 AM

## 2016-12-27 NOTE — Evaluation (Signed)
Physical Therapy Evaluation Patient Details Name: [name] MRN: [id number] DOB: Jul 07, 1945 Today's Date: 12/27/2016   History of Present Illness  71 y.o. female PMH of  CAD (s/p NSTEMI in 06/2015 with DES to PDA and RCA at that time, subtotal occlusion of LCX treated medically; repeat cath 11/2015 with stable disease; inferolateral scar w/o ischemia), chronic combined systolic and diastolic CHF (EF 19-41% by echo in 11/2016), paroxysmal atrial fibrillation, right subclavian stenosis (s/p intervention), IDDM, HTN, HLD, and CKD Stage IV admitted on 12/23/2016 for worsening edema and fatigue. Pt being treated for acute on chronic combined systolic and diastolic CHF, acute on chronic Stage 4 CKD, paroxysmal A-fib, CAD, anemia, hypoprothrobinemia, and IDDM.   Clinical Impression  Pt admitted with above diagnosis. Pt currently with functional limitations due to the deficits listed below (see PT Problem List). Pt mobility is currently limited by fatigue and generalized weakness. Pt is modA for transfers and minA for ambulation of 15 feet with RW. Pt will benefit from skilled PT to increase their independence and safety with mobility to allow discharge to the venue listed below.       Follow Up Recommendations SNF    Equipment Recommendations  None recommended by PT    Recommendations for Other Services       Precautions / Restrictions Precautions Precautions: Fall Restrictions Weight Bearing Restrictions: No      Mobility  Bed Mobility               General bed mobility comments: pt OOB in chair upon arrival  Transfers Overall transfer level: Needs assistance Equipment used: Rolling walker (2 wheeled)   Sit to Stand: Mod assist         General transfer comment: modA for power up and steadying in standing, vc for scooting to edge of chair, hand placement, and anterior pelvic tilt  Ambulation/Gait Ambulation/Gait assistance: Min assist Ambulation Distance (Feet): 15  Feet Assistive device: Rolling walker (2 wheeled) Gait Pattern/deviations: Step-through pattern;Decreased step length - right;Decreased step length - left;Shuffle;Trunk flexed Gait velocity: slowed Gait velocity interpretation: Below normal speed for age/gender General Gait Details: minA for steadying, vc for upright posture      Balance Overall balance assessment: Needs assistance Sitting-balance support: No upper extremity supported;Feet supported Sitting balance-[name]: Good Sitting balance - Comments: at edge of chair   Standing balance support: Bilateral upper extremity supported Standing balance-[name]: Poor Standing balance comment: requires UE support for maintaining balance                             Pertinent Vitals/Pain Pain Assessment: No/denies pain    Home Living Family/patient expects to be discharged to:: Skilled nursing facility Living Arrangements: Spouse/significant other;Children                    Prior Function Level of Independence: Needs assistance   Gait / Transfers Assistance Needed: ambulates household distances with RW   ADL's / Homemaking Assistance Needed: needs assist for LE dressing         Hand Dominance    Right     Extremity/Trunk Assessment   Upper Extremity Assessment Upper Extremity Assessment: RUE deficits/detail RUE Deficits / Details: numbness of hand, longstanding    Lower Extremity Assessment Lower Extremity Assessment: Generalized weakness;RLE deficits/detail;LLE deficits/detail RLE Deficits / Details: wound secondary to volume overload LLE Deficits / Details: wound secondary to volume overload    Cervical / Trunk  Assessment Cervical / Trunk Assessment: Normal  Communication   Communication: No difficulties  Cognition Arousal/Alertness: Awake/alert Behavior During Therapy: WFL for tasks assessed/performed Overall Cognitive Status: Within Functional Limits for tasks assessed                                         General Comments General comments (skin integrity, edema, etc.): SaO2 on 2 L O2 via nasal cannula 98%O2, VSS throughout session        Assessment/Plan    PT Assessment Patient needs continued PT services  PT Problem List Decreased strength;Decreased activity tolerance;Decreased mobility;Decreased balance       PT Treatment Interventions DME instruction;Gait training;Functional mobility training;Therapeutic activities;Therapeutic exercise;Balance training;Patient/family education    PT Goals (Current goals can be found in the Care Plan section)  Acute Rehab PT Goals Patient Stated Goal: Go to Rehab then home if possible PT Goal Formulation: With patient Time For Goal Achievement: 01/10/17 Potential to Achieve Goals: Fair    Frequency Min 3X/week   Barriers to discharge Decreased caregiver support         AM-PAC PT "6 Clicks" Daily Activity  Outcome Measure Difficulty turning over in bed (including adjusting bedclothes, sheets and blankets)?: A Little Difficulty moving from lying on back to sitting on the side of the bed? : Unable Difficulty sitting down on and standing up from a chair with arms (e.g., wheelchair, bedside commode, etc,.)?: Unable Help needed moving to and from a bed to chair (including a wheelchair)?: A Lot Help needed walking in hospital room?: A Little Help needed climbing 3-5 steps with a railing? : Total 6 Click Score: 11    End of Session Equipment Utilized During Treatment: Gait belt;Oxygen Activity Tolerance: Patient limited by fatigue Patient left: in chair;with call bell/phone within reach Nurse Communication: Mobility status PT Visit Diagnosis: Unsteadiness on feet (R26.81);Other abnormalities of gait and mobility (R26.89);Muscle weakness (generalized) (M62.81)    Time: 3570-1779 PT Time Calculation (min) (ACUTE ONLY): 29 min   Charges:   PT Evaluation $PT Eval Moderate Complexity: 1  Mod PT Treatments $Gait Training: 8-22 mins   PT G Codes:        [name] B. Migdalia Dk PT, DPT Acute Rehabilitation  [phone] Pager [phone]    [place] 12/27/2016, 3:21 PM

## 2016-12-27 NOTE — Progress Notes (Addendum)
ANTICOAGULATION CONSULT NOTE - Initial Consult  Pharmacy Consult for Coumadin   (see ADDENDUM below:  Hold coumadin ; start bridge with IV Heparin drip when INR =/<2) Indication: atrial fibrillation  Allergies  Allergen Reactions  . Ace Inhibitors Cough  . Amlodipine Other (See Comments)    Edema   . Codeine Rash    Patient Measurements: Height: 5\' 8"  (172.7 cm) Weight: 205 lb 4.8 oz (93.1 kg) (scale c) IBW/kg (Calculated) : 63.9 Heparin Dosing Weight:    Vital Signs: Temp: 98.1 F (36.7 C) (10/23 0550) Temp Source: Oral (10/23 0550) BP: 128/53 (10/23 0550) Pulse Rate: 55 (10/23 0550)  Labs:  Recent Labs  12/25/16 0654 12/26/16 0522 12/26/16 1429 12/27/16 0524  HGB 8.7* 8.6*  --  8.8*  HCT 29.2* 28.1*  --  28.3*  PLT 208 221  --  214  LABPROT 45.9* 55.1* 47.1* 24.3*  INR 4.98* 6.29* 5.15* 2.20  CREATININE 3.83* 3.68*  --  3.53*    Estimated Creatinine Clearance: 17.2 mL/min (A) (by C-G formula based on SCr of 3.53 mg/dL (H)).   Medical History: Past Medical History:  Diagnosis Date  . Acute renal failure superimposed on stage 4 chronic kidney disease ([place]) 07/04/2015  . Allergic rhinitis   . Anemia   . Anxiety   . CAD in native artery    NSTEMI 07/2015 s/p DES to RCA and posterior PDA, PCI 10/2015 with scoring balloon to 85% ISR of distal RCA), known LAD/Cx disease treated medically  . Carotid artery disease ([place])    Mild bilateral carotid disease (1-39% 07/2015)  . Chronic combined systolic and diastolic CHF (congestive heart failure) ([place])   . CKD (chronic kidney disease), stage IV ([place])   . Constipation   . Essential hypertension   . GERD (gastroesophageal reflux disease)   . History of hysterectomy   . Hyperlipidemia   . Low back pain   . Obesity   . 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 Osteoarthritis   . Overactive bladder   . PVC's (premature ventricular contractions)    . Type 2 diabetes mellitus ([facility])     Medications:  Prescriptions Prior to Admission  Medication Sig Dispense Refill Last Dose  . albuterol (PROVENTIL HFA;VENTOLIN HFA) 108 (90 Base) MCG/ACT inhaler Inhale 1-2 puffs into the lungs every 6 (six) hours as needed for wheezing or shortness of breath. 1 Inhaler 0 unknnown at prn  . albuterol (PROVENTIL) (2.5 MG/3ML) 0.083% nebulizer solution Take 3 mLs (2.5 mg total) by nebulization every 6 (six) hours as needed for wheezing or shortness of breath. 75 mL 1 unknown at prn  . ALPRAZolam (XANAX) 0.5 MG tablet Take 0.5 mg by mouth at bedtime.   12/22/2016  . amiodarone (PACERONE) 200 MG tablet Take 1 tablet (200 mg total) by mouth 2 (two) times daily. New medication to control your heart rate. 60 tablet 2 12/23/2016  . cetirizine (ZYRTEC) 10 MG tablet Take 5 mg by mouth daily.  3 12/23/2016  . clopidogrel (PLAVIX) 75 MG tablet Take 1 tablet (75 mg total) by mouth daily. 30 tablet 0 12/23/2016  . cyclobenzaprine (FLEXERIL) 5 MG tablet Take 5 mg by mouth as needed (lower back pain). osteoporosis with out current pathological fracture for 14 days  0 unknown at prn  . diazepam (VALIUM) 5 MG tablet Take 1 tablet (5 mg total) by mouth every 12 (twelve) hours as needed for muscle spasms. 10 tablet  0 unknown at prn  . ferrous sulfate 325 (65 FE) MG tablet Take 325 mg by mouth daily with breakfast.    12/23/2016  . fluticasone (FLONASE) 50 MCG/ACT nasal spray Place 1 spray into both nostrils daily.   12/23/2016  . hydrALAZINE (APRESOLINE) 100 MG tablet Take 0.5 tablets (50 mg total) by mouth 2 (two) times daily. (Patient taking differently: Take 25 mg by mouth 2 (two) times daily. )   12/23/2016  . insulin glargine (LANTUS) 100 UNIT/ML injection Inject 0.3 mLs (30 Units total) into the skin at bedtime. (Patient taking differently: Inject 40 Units into the skin at bedtime. )   12/22/2016  . isosorbide mononitrate (IMDUR) 60 MG 24 hr tablet Take 0.5 tablets (30 mg  total) by mouth daily.   12/23/2016  . latanoprost (XALATAN) 0.005 % ophthalmic solution Place 1 drop into both eyes at bedtime.    12/22/2016  . meclizine (ANTIVERT) 25 MG tablet Take 12.5 mg by mouth 2 (two) times daily.    12/23/2016  . metolazone (ZAROXOLYN) 2.5 MG tablet Take one tab by mouth x 3 days only, then only as directed by MD 10 tablet 0 12/23/2016  . metoprolol succinate (TOPROL-XL) 100 MG 24 hr tablet Take 1 tablet (100 mg total) by mouth daily with breakfast. Take with or immediately following a meal. 30 tablet 2 12/23/2016 at unknown  . nitroGLYCERIN (NITROSTAT) 0.4 MG SL tablet Place 1 tablet (0.4 mg total) under the tongue every 5 (five) minutes as needed for chest pain. 25 tablet 2 unknown at prn  . NOVOLOG FLEXPEN 100 UNIT/ML FlexPen Inject 10 Units into the skin See admin instructions. With meals hold if glucose is less than 150  Notify MD if glucose is less that 60 and greater than 400  0 12/23/2016  . pantoprazole (PROTONIX) 40 MG tablet Take 40 mg by mouth daily.    12/23/2016  . rosuvastatin (CRESTOR) 40 MG tablet Take 1 tablet (40 mg total) by mouth at bedtime. 30 tablet 0 12/22/2016  . torsemide (DEMADEX) 20 MG tablet Take 4 tablets (80 mg total) by mouth 2 (two) times daily. Pt is able to take an additional tablet daily for weight gain greater than 3lbs. (Patient taking differently: Take 100 mg by mouth See admin instructions. Take (100 mg) by mouth twice daily - may take an additional tablet (20 mg) daily for weight gain greater than 3lbs.) 240 tablet 2 12/23/2016  . torsemide (DEMADEX) 20 MG tablet Take 20 mg by mouth as needed. Every 24 hours as needed for weight gain greater that 3 lbs   unknown at prn   Scheduled:  . ALPRAZolam  0.5 mg Oral QHS  . amiodarone  200 mg Oral Daily  . clopidogrel  75 mg Oral Daily  . cyclobenzaprine  5 mg Oral QHS  . darbepoetin (ARANESP) injection - NON-DIALYSIS  150 mcg Subcutaneous Q Mon-1800  . fluticasone  1 spray Each Nare Daily   . furosemide  160 mg Oral TID  . hydrALAZINE  50 mg Oral BID  . insulin aspart  0-15 Units Subcutaneous TID WC  . isosorbide mononitrate  30 mg Oral BID  . latanoprost  1 drop Both Eyes QHS  . loratadine  10 mg Oral Daily  . meclizine  12.5 mg Oral BID  . mouth rinse  15 mL Mouth Rinse BID  . metoprolol succinate  25 mg Oral Q breakfast  . pantoprazole  40 mg Oral Daily  . potassium chloride  30  mEq Oral BID  . rosuvastatin  40 mg Oral q1800  . senna-docusate  2 tablet Oral BID  . sodium chloride flush  3 mL Intravenous Q12H    Assessment: 71 y.o female on coumadin PTA for h/o atrial fibrillation.  INR on admit date 10/19 was supratherapeutic at 4.38.   PTA dose: alternating 3.75 and 7.5 doses,  Unknown when last dose pta was taken.   and continued to increase.  Coumadin dose was held on admission. INR continued to trend upward.  INR 4.98>6.29>5.15 > today INR is 2.20 after Vitamin K 5mg  po and 2.5mg  po given yesterday 12/26/16.   -on Amiodarone PTA, continued as inpatient but dose reduced to 200mg  daily. Pharmacy consulted to resumed Coumadin dosing today for h/o PAF.  H/H 8.8/28.3 remains low but stable. PLTC 214k is wnl.   Goal of Therapy:  INR 2-3 Monitor platelets by anticoagulation protocol: Yes   Plan:  Coumadin 3.75 mg today x1 Daily PT/INR  Thank you for allowing pharmacy to be part of this patients care team.  Nicole Cella, [place] Clinical Pharmacist Pager: [phone] 8a-330p [phone] 330p-1030p phone [phone] or [phone] Main pharmacy [phone] 12/27/2016,12:57 PM     ADDENDUM:    Hold coumadin ; start bridge with IV Heparin drip when INR =/<2  [facility] consulted for permanent dialysis access and patient is on OR schedule for 10/25.  [facility] noted --pt will need to be off her coumadin to proceed with access.  Cardiology PA, Bernerd Pho has discussed with [facility].  Plan is to hold coumadin and start IV heparin drip bridge when INR =or<2.0.  Expect INR may decrease <2 due to patient  received Vitamin K total of 7.5mg  po yesterday.   Plan:  Coumadin on hold. Check INR tonight at 18:00, start IV heparin when INR = or < 2.0 Daily INR qAM.   Nicole Cella, [place] Clinical Pharmacist Pager: [phone] 8a-330p [phone] 330p-1030p phone [phone] or [phone] Main pharmacy [phone] 12/27/2016 2:06 PM

## 2016-12-27 NOTE — Progress Notes (Signed)
PROGRESS NOTE  [name] DDU:[id number] DOB: 1945/10/13 DOA: 12/23/2016 PCP: [name], MD  HPI/Recap of past 24 hours: No acute events overnight per RN. INR is down to therapeutic level.+constipation. Intermittent cough with dark brown sputum. No chest pain. Constitutional:71 year old African-American female, obese alert oriented x4 sitting up in bed in no acute distress. Eyes:Pupils are round and reactive to light; anicteric sclera ENMT:Mucous membranes are dry.  No oral erythema or exudate noted. Neck:No JVD, supple, no masses, no thyromegaly Respiratory:Improved mild rhonchi noted at bases bilaterally withnormal respiratory effort.  Cardiovascular:S1 &S2 heard, irregular rate and rhythm. Lower extremity edema improving 1+ pitting edema with watery blisters. Abdomen:Obese,nontenderness, non distended, no masses palpated. Bowel sounds x4 quadrants.  Musculoskeletal:no clubbing / cyanosis. No joint deformity upper and lower extremities.  Skin:Lower legs wrapped in gauze bilaterally;blisters noted in lower extremities bilaterally. Neurologic:CN 2-12 grossly intact. Sensation intact. Strength 5/5 in all 4 limbs.  Psychiatric:Mood is appropriate for condition and setting.    Objective: Vitals:   12/26/16 1321 12/26/16 1535 12/26/16 2053 12/27/16 0550  BP:  125/66 140/61 (!) 128/53  Pulse:  (!) 53 (!) 55 (!) 55  Resp:   20 20  Temp:  98 F (36.7 C) 98.2 F (36.8 C) 98.1 F (36.7 C)  TempSrc:  Oral Oral Oral  SpO2: 95% 100% 100% 99%  Weight:    93.1 kg (205 lb 4.8 oz)  Height:        Intake/Output Summary (Last 24 hours) at 12/27/16 1555 Last data filed at 12/27/16 0903  Gross per 24 hour  Intake              597 ml  Output              600 ml  Net               -3 ml   Filed Weights   12/25/16 0514 12/26/16 0501 12/27/16 0550  Weight: 92.6 kg (204 lb 3.2 oz) 92.4 kg (203 lb 12.8 oz) 93.1 kg (205 lb 4.8 oz)    Data  Reviewed: CBC:  Recent Labs Lab 12/23/16 1855 12/25/16 0654 12/26/16 0522 12/27/16 0524  WBC 4.1 4.0 3.9* 3.5*  NEUTROABS 3.0 3.0 2.7 2.3  HGB 8.4* 8.7* 8.6* 8.8*  HCT 27.9* 29.2* 28.1* 28.3*  MCV 94.9 95.1 94.9 95.0  PLT 226 208 221 237   Basic Metabolic Panel:  Recent Labs Lab 12/23/16 1855 12/24/16 0805 12/25/16 0654 12/26/16 0522 12/27/16 0524  NA 136 136 137 136 135  K 3.1* 3.4* 3.3* 4.3 4.2  CL 94* 92* 97* 96* 96*  CO2 28 26 27 27 27   GLUCOSE 147* 163* 108* 220* 249*  BUN 104* 99* 96* 91* 85*  CREATININE 4.71* 4.30* 3.83* 3.68* 3.53*  CALCIUM 9.1 9.4 8.9 9.0 9.1  PHOS 4.6  --   --   --  3.6   GFR: Estimated Creatinine Clearance: 17.2 mL/min (A) (by C-G formula based on SCr of 3.53 mg/dL (H)). Liver Function Tests:  Recent Labs Lab 12/23/16 1855 12/27/16 0524  ALBUMIN 3.0* 2.9*   No results for input(s): LIPASE, AMYLASE in the last 168 hours. No results for input(s): AMMONIA in the last 168 hours. Coagulation Profile:  Recent Labs Lab 12/24/16 0805 12/25/16 0654 12/26/16 0522 12/26/16 1429 12/27/16 0524  INR 3.49 4.98* 6.29* 5.15* 2.20   Cardiac Enzymes: No results for input(s): CKTOTAL, CKMB, CKMBINDEX, TROPONINI in the last 168 hours. BNP (last 3 results) No results  for input(s): PROBNP in the last 8760 hours. HbA1C: No results for input(s): HGBA1C in the last 72 hours. CBG:  Recent Labs Lab 12/26/16 1137 12/26/16 1634 12/26/16 2047 12/27/16 0738 12/27/16 1119  GLUCAP 292* 294* 221* 237* 259*   Lipid Profile: No results for input(s): CHOL, HDL, LDLCALC, TRIG, CHOLHDL, LDLDIRECT in the last 72 hours. Thyroid Function Tests: No results for input(s): TSH, T4TOTAL, FREET4, T3FREE, THYROIDAB in the last 72 hours. Anemia Panel:  Recent Labs  12/27/16 1248  FERRITIN 643*  TIBC 316  IRON 208*   Urine analysis:    Component Value Date/Time   COLORURINE YELLOW 12/23/2016 [place] 12/23/2016 1810   LABSPEC 1.009  12/23/2016 1810   PHURINE 5.0 12/23/2016 1810   GLUCOSEU NEGATIVE 12/23/2016 1810   HGBUR SMALL (A) 12/23/2016 1810   BILIRUBINUR NEGATIVE 12/23/2016 1810   KETONESUR NEGATIVE 12/23/2016 1810   PROTEINUR NEGATIVE 12/23/2016 1810   UROBILINOGEN 0.2 08/23/2007 2037   NITRITE NEGATIVE 12/23/2016 1810   LEUKOCYTESUR LARGE (A) 12/23/2016 1810   Sepsis Labs: @LABRCNTIP (procalcitonin:4,lacticidven:4)  ) Recent Results (from the past 240 hour(s))  MRSA PCR Screening     Status: None   Collection Time: 12/24/16  1:39 AM  Result Value Ref Range Status   MRSA by PCR NEGATIVE NEGATIVE Final    Comment:        The GeneXpert MRSA Assay (FDA approved for NASAL specimens only), is one component of a comprehensive MRSA colonization surveillance program. It is not intended to diagnose MRSA infection nor to guide or monitor treatment for MRSA infections.       Studies: US Renal  Result Date: 12/26/2016 CLINICAL DATA:  Acute onset of renal insufficiency. Initial encounter. EXAM: RENAL / URINARY TRACT ULTRASOUND COMPLETE COMPARISON:  Renal ultrasound performed 12/12/2014, and CT of the abdomen and pelvis from 03/20/2016 FINDINGS: Right Kidney: Length: 11.1 cm. Echogenicity within normal limits. No mass or hydronephrosis visualized. Left Kidney: Length: 10.0 cm. Echogenicity within normal limits. Left renal stones measure up to 6 mm in size. No mass or hydronephrosis visualized. Bladder: Decompressed and not [name] characterized. IMPRESSION: 1. No evidence of hydronephrosis. 2. Nonobstructing left renal stones measure up to 6 mm in size. Electronically Signed   By: Garald Balding M.D.   On: 12/26/2016 21:50    Scheduled Meds: . ALPRAZolam  0.5 mg Oral QHS  . amiodarone  200 mg Oral Daily  . clopidogrel  75 mg Oral Daily  . cyclobenzaprine  5 mg Oral QHS  . darbepoetin (ARANESP) injection - NON-DIALYSIS  150 mcg Subcutaneous Q Mon-1800  . fluticasone  1 spray Each Nare Daily  . furosemide  160  mg Oral TID  . hydrALAZINE  50 mg Oral BID  . insulin aspart  0-15 Units Subcutaneous TID WC  . isosorbide mononitrate  30 mg Oral BID  . latanoprost  1 drop Both Eyes QHS  . loratadine  10 mg Oral Daily  . meclizine  12.5 mg Oral BID  . mouth rinse  15 mL Mouth Rinse BID  . metoprolol succinate  25 mg Oral Q breakfast  . pantoprazole  40 mg Oral Daily  . potassium chloride  30 mEq Oral BID  . rosuvastatin  40 mg Oral q1800  . senna-docusate  2 tablet Oral BID  . sodium chloride flush  3 mL Intravenous Q12H    Continuous Infusions: . sodium chloride       LOS: 4 days   Assessment/Plan:  Acute on  chronic heart failure exacerbation with reduced EF 30-35% -EF 30-35% (11/2016) -Continue diuresis,fluid restriction,strict I's and O's -Lasix 60 mg IV twice daily, Imdur 30 mg twice daily, Toprol-XL 50 mg daily, Crestor 40 mg daily, amiodarone 200 mg twice daily -Continue cardiac monitoring  AKI on CKD stage IV -cr. 3.22 from 3.45, cr 3.6 from 3.8 from 4.7 on admission  Oliguria in the setting of advanced CKD -Nephrology consulted; we appreciate recommendations -Low urine output 600 cc urine output in 24 hr. -Renal ultrasound showed non obstructive left renal stones -Nephrology consulted; appreciate recommendations -Avoid nephrotoxic agents -Repeat BMP in the morning  Fluid overload -Most likely multifactorial secondary to advanced renal failure versus acute on chronic heart failure with reduced ejection fraction -Continue to restrict salt and fluid -Continue Lasix IV 60 twice daily  Hypoglycemia -Resolved -Continue insulin sliding scale with hypoglycemiaprotocol  Atrial fibrillation -Rate controlled continue amiodarone  Supratherapeutic INR -resolved -No sign of bleeding -repeat INR in am  Hypokalemia most likely secondary to chronic diuresis use -Resolved -Replete as indicated -BMP in the morning  Insulin-dependent type 2 diabetes -Last A1c 8.8%  10/31/2016 -Insulin sliding scale with hypoglycemia protocol  Coronary artery disease -Continue metoprolol,Crestor, plavix  Anxiety -Continue Xanax 0.5 mg p.o. Nightly  GERD -Continue Protonix 40 mg p.o. Daily  Ambulatory dysfunction -Ambulates with a walker at home -continue PT    Code Status:Full  Family Communication:None  Disposition Plan:Will stay anothermidnight to continue treatment; cardiology and nephrology consulted; we appreciate recommendations.   Consultants:  Nephrology, cardiology  Procedures:  None  Antimicrobials:  Not indicated   DVT prophylaxis:  Coumadin for permanent a-fib   Kayleen Memos, MD [facility] Pager [phone]  If 7PM-7AM, please contact night-coverage [email or web address] Password [email or web address] 12/27/2016, 3:56 PM

## 2016-12-27 NOTE — Progress Notes (Addendum)
Attempted to get patient for vein mapping. Patient refusing at this time until she speaks with family on 10/24. Will check back.  Landry Mellow, RDMS, RVT Vascular lab

## 2016-12-27 NOTE — Progress Notes (Signed)
Inpatient Diabetes Program Recommendations  AACE/ADA: New Consensus Statement on Inpatient Glycemic Control (2015)  Target Ranges:  Prepandial:   less than 140 mg/dL      Peak postprandial:   less than 180 mg/dL (1-2 hours)      Critically ill patients:  140 - 180 mg/dL   Lab Results  Component Value Date   BFXOVA 919 (H) 12/27/2016   HGBA1C 8.8 (H) 10/31/2016    Review of Glycemic Control  Diabetes history: DM2 Outpatient Diabetes medications: Lantus 40 units QHS, Novolog 10 units tidwc Current orders for Inpatient glycemic control: Novolog 0-15 units tidwc,   HgbA1C - 8.8% - sub-par glycemic control    Inpatient Diabetes Program Recommendations:    Add Lantus 20 units QHS (half home dose) Novolog 4 units tidwc  Needs to f/u with PCP who manages her DM. May need adjustments.  Thank you. Lorenda Peck, RD, LDN, CDE Inpatient Diabetes Coordinator [phone]

## 2016-12-27 NOTE — Progress Notes (Addendum)
Pt refusing to have vein mapping today, she wants to talk with her 3 children before proceeding to do any further workup for dialysis. She states all 3 of her children will be here tomorrow so she can talk with them.  Will try to reach Leontine Locket, PA with vascular to inform her.    At [place] notified PA Aldona Bar and Dr Scot Dock of the above. Dr Scot Dock still to see pt today.

## 2016-12-27 NOTE — Progress Notes (Signed)
   Contacted by Pharmacy regarding Coumadin dosing as VVS consulted on the patient earlier today and are planning on AV Fistula placement later this week and need to hold Coumadin. Reviewed with Vascular Surgery PA [facility]) and they are fine with [place] bridging with Heparin prior to the procedure.   Signed, Erma Heritage, PA-C 12/27/2016, 2:06 PM Pager: [phone]

## 2016-12-27 NOTE — Progress Notes (Signed)
Rt called to bedside to pt having shortness of breath. RN stated that she already gave a breathing treatment and patient states that when I usually come to the hospital and cant breathe like this they give me BIPAP. PT vitals were stable. sats 100% 2L nasal cannula RR 24 with little shortness of breath. Placed pt on CPAP with bilevel settings of 12/6 and 2L bled in. PT on M full faced mask and tolerating well. RT to cont to monitor.

## 2016-12-27 NOTE — NC FL2 (Signed)
[place] LEVEL OF CARE SCREENING TOOL     IDENTIFICATION  Patient Name: [name] Birthdate: [birth year] Sex: female Admission Date (Current Location): 12/23/2016  [facility] and [place] Number:   ([place])   Facility and Address:  [facility], [place] [address] [place], [place] [address]      Provider Number: [id number]  Attending Physician Name and Address:  Kayleen Memos, DO  Relative Name and Phone Number:       Current Level of Care: Hospital Recommended Level of Care: [place] Prior Approval Number:    Date Approved/Denied:   PASRR Number:    Discharge Plan: SNF    Current Diagnoses: Patient Active Problem List   Diagnosis Date Noted  . Rhonchi   . Hypervolemia   . Hypokalemia 12/23/2016  . Goals of care, counseling/discussion   . Palliative care encounter   . Hypercoagulopathy ([place]) 12/03/2016  . Acute on chronic heart failure ([place]) 12/03/2016  . Ischemic cardiomyopathy   . Generalized weakness   . Atrial fibrillation with RVR ([place]) 11/19/2016  . Abnormal nuclear stress test   . Left ventricular dysfunction   . Atrial fibrillation ([place]) 11/13/2016  . Anemia in chronic kidney disease   . Atrial fibrillation, new onset ([place]) 11/10/2016  . Hypoglycemia due to insulin 11/10/2016  . Pleural effusion on right 10/31/2016  . HCAP (healthcare-associated pneumonia) 10/31/2016  . DM type 2 causing vascular disease ([place]) 10/05/2016  . Personal history of noncompliance with medical treatment, presenting hazards to health 08/22/2016  . Hyperglycemia 08/07/2016  . Pulmonary edema 07/14/2016  . CKD (chronic kidney disease), stage IV ([place]) 07/14/2016  . Acute respiratory failure with hypoxia ([place]) 07/09/2016  . Acute on chronic systolic CHF (congestive heart failure) ([place]) 12/01/2015  . AKI (acute kidney injury) ([place])   . Ischemia of upper extremity   . Right knee pain   . Subclavian artery stenosis, right  ([place]) 07/07/2015  . Acute on chronic combined systolic and diastolic CHF (congestive heart failure) ([place]) 07/04/2015  . Elevated troponin 07/04/2015  . Acute renal failure superimposed on chronic kidney disease ([place]) 07/04/2015  . Non-ST elevation (NSTEMI) myocardial infarction ([place]) 07/04/2015  . Elevated d-dimer 07/04/2015  . Acute respiratory failure with hypercapnia ([place])   . Bilateral lower extremity edema 01/20/2012  . Type 2 diabetes mellitus with stage 4 chronic kidney disease, with long-term current use of insulin ([place]) 01/21/2011  . Overweight 07/20/2009  . Coronary artery disease 10/08/2008  . HEART MURMUR, SYSTOLIC 67/34/1937  . SHOULDER PAIN 02/05/2007  . Hyperlipemia 04/11/2006  . Anxiety state 04/11/2006  . SYNDROME, CARPAL TUNNEL 04/11/2006  . Essential hypertension 04/11/2006  . ALLERGIC RHINITIS 04/11/2006  . GERD 04/11/2006  . Constipation 04/11/2006  . OVERACTIVE BLADDER 04/11/2006  . OSTEOARTHRITIS 04/11/2006  . LOW BACK PAIN 04/11/2006    Orientation RESPIRATION BLADDER Height & Weight     Self, Time, Situation, Place  Normal Incontinent, External catheter (Incontinent at times) Weight: 205 lb 4.8 oz (93.1 kg) (scale c) Height:  5\' 8"  (172.7 cm)  BEHAVIORAL SYMPTOMS/MOOD NEUROLOGICAL BOWEL NUTRITION STATUS   (None)  (None) Continent Diet (Heart healthy/carb modified. Fluid restriction 1500 mL.)  AMBULATORY STATUS COMMUNICATION OF NEEDS Skin   Limited Assist Verbally Skin abrasions, Bruising, Other (Comment) (Blister, Skin tear. Non pressure wound: Right; left; anterior; posterior legs (Foam).)  Personal Care Assistance Level of Assistance  Bathing, Feeding, Dressing Bathing Assistance: Limited assistance Feeding assistance: Limited assistance Dressing Assistance: Limited assistance     Functional Limitations Info  Sight, Hearing, Speech Sight Info: Adequate Hearing Info: Adequate Speech Info: Adequate    SPECIAL CARE  FACTORS FREQUENCY  PT (By licensed PT), OT (By licensed OT), Blood pressure     PT Frequency: 5 x week OT Frequency: 5 x week            Contractures Contractures Info: Not present    Additional Factors Info  Code Status, Allergies, Psychotropic Code Status Info: Full Allergies Info: Ace Inhibitors, Amlodipine, Codeine Psychotropic Info: Anxiety: Xanax 0.5 mg PO , Valium 5 mg PO every 12 hours prn.         Current Medications (12/27/2016):  This is the current hospital active medication list Current Facility-Administered Medications  Medication Dose Route Frequency Provider Last Rate Last Dose  . 0.9 %  sodium chloride infusion  250 mL Intravenous PRN Opyd, Ilene Qua, MD      . acetaminophen (TYLENOL) tablet 650 mg  650 mg Oral Q4H PRN Opyd, Ilene Qua, MD      . albuterol (PROVENTIL) (2.5 MG/3ML) 0.083% nebulizer solution 2.5 mg  2.5 mg Nebulization Q6H PRN Opyd, Ilene Qua, MD      . ALPRAZolam Duanne Moron) tablet 0.5 mg  0.5 mg Oral QHS Walden Field A, NP   0.5 mg at 12/26/16 2212  . amiodarone (PACERONE) tablet 200 mg  200 mg Oral Daily Croitoru, Mihai, MD   200 mg at 12/27/16 1028  . clopidogrel (PLAVIX) tablet 75 mg  75 mg Oral Daily Opyd, Ilene Qua, MD   75 mg at 12/27/16 1028  . cyclobenzaprine (FLEXERIL) tablet 5 mg  5 mg Oral QHS Opyd, Ilene Qua, MD      . Darbepoetin Alfa (ARANESP) injection 150 mcg  150 mcg Subcutaneous Q Mon-1800 Lorella Nimrod, MD   150 mcg at 12/26/16 1829  . diazepam (VALIUM) tablet 5 mg  5 mg Oral Q12H PRN Opyd, Ilene Qua, MD      . fluticasone (FLONASE) 50 MCG/ACT nasal spray 1 spray  1 spray Each Nare Daily Opyd, Ilene Qua, MD   1 spray at 12/27/16 1031  . furosemide (LASIX) tablet 160 mg  160 mg Oral TID Mauricia Area, MD   160 mg at 12/27/16 1642  . hydrALAZINE (APRESOLINE) tablet 50 mg  50 mg Oral BID Opyd, Ilene Qua, MD   50 mg at 12/27/16 1028  . insulin aspart (novoLOG) injection 0-15 Units  0-15 Units Subcutaneous TID WC Opyd, Ilene Qua, MD    8 Units at 12/27/16 1234  . isosorbide mononitrate (IMDUR) 24 hr tablet 30 mg  30 mg Oral BID Opyd, Ilene Qua, MD   30 mg at 12/27/16 1028  . latanoprost (XALATAN) 0.005 % ophthalmic solution 1 drop  1 drop Both Eyes QHS Opyd, Ilene Qua, MD   1 drop at 12/26/16 2213  . loratadine (CLARITIN) tablet 10 mg  10 mg Oral Daily Opyd, Ilene Qua, MD   10 mg at 12/24/16 0903  . meclizine (ANTIVERT) tablet 12.5 mg  12.5 mg Oral BID Opyd, Ilene Qua, MD   12.5 mg at 12/27/16 1028  . MEDLINE mouth rinse  15 mL Mouth Rinse BID Opyd, Ilene Qua, MD   15 mL at 12/26/16 2200  . metoprolol succinate (TOPROL-XL) 24 hr tablet 25 mg  25 mg Oral Q breakfast Croitoru, Mihai, MD  25 mg at 12/27/16 0831  . ondansetron (ZOFRAN) injection 4 mg  4 mg Intravenous Q6H PRN Opyd, Ilene Qua, MD      . pantoprazole (PROTONIX) EC tablet 40 mg  40 mg Oral Daily Opyd, Ilene Qua, MD   40 mg at 12/27/16 1029  . potassium chloride (K-DUR,KLOR-CON) CR tablet 30 mEq  30 mEq Oral BID Opyd, Ilene Qua, MD   30 mEq at 12/27/16 1028  . rosuvastatin (CRESTOR) tablet 40 mg  40 mg Oral q1800 Vianne Bulls, MD   40 mg at 12/26/16 2211  . senna-docusate (Senokot-S) tablet 2 tablet  2 tablet Oral BID Irene Pap N, DO   2 tablet at 12/27/16 1028  . sodium chloride flush (NS) 0.9 % injection 3 mL  3 mL Intravenous Q12H Opyd, Ilene Qua, MD   3 mL at 12/27/16 1033  . sodium chloride flush (NS) 0.9 % injection 3 mL  3 mL Intravenous PRN Opyd, Ilene Qua, MD         Discharge Medications: Please see discharge summary for a list of discharge medications.  Relevant Imaging Results:  Relevant Lab Results:   Additional Information SS#: [id number]  Candie Chroman, LCSW

## 2016-12-27 NOTE — Progress Notes (Signed)
S: 71 year old lady with a history of HFrEF, CKD, P Afib. Admitted for volume overload and worsening renal function.  Patient was feeling much better when seen this morning as her shortness of breath is improving. Creatinine stable at 3.53.   O:BP (!) 128/53 (BP Location: Left Arm)   Pulse (!) 55   Temp 98.1 F (36.7 C) (Oral)   Resp 20   Ht 5\' 8"  (1.727 m)   Wt 205 lb 4.8 oz (93.1 kg) Comment: scale c  SpO2 99%   BMI 31.22 kg/m   Intake/Output Summary (Last 24 hours) at 12/27/16 1317 Last data filed at 12/27/16 0903  Gross per 24 hour  Intake              597 ml  Output              950 ml  Net             -353 ml   Intake/Output: I/O last 3 completed shifts: In: 70 [P.O.:960; IV Piggyback:117] Out: 0093 [Urine:1550]  Intake/Output this shift:  Total I/O In: 240 [P.O.:240] Out: -  Weight change: 1 lb 8 oz (0.68 kg) Gen: Well-developed lady, sitting comfortably on the side of her bed, in no acute distress.  CVS: Regular rate and rhythm. Gr2/6 M Resp: Decreased breath sounds at bases bilaterally. Rales above., ^ dullness to percussion in bases Abd: Soft, nontender, bowel sounds positive. Liver down 6 cm Ext: 3+ LE edema up to knees with blistering and signs of venous stasis, lower legs wrapped.   Recent Labs Lab 12/23/16 1855 12/24/16 0805 12/25/16 0654 12/26/16 0522 12/27/16 0524  NA 136 136 137 136 135  K 3.1* 3.4* 3.3* 4.3 4.2  CL 94* 92* 97* 96* 96*  CO2 28 26 27 27 27   GLUCOSE 147* 163* 108* 220* 249*  BUN 104* 99* 96* 91* 85*  CREATININE 4.71* 4.30* 3.83* 3.68* 3.53*  ALBUMIN 3.0*  --   --   --  2.9*  CALCIUM 9.1 9.4 8.9 9.0 9.1  PHOS 4.6  --   --   --  3.6   Liver Function Tests:  Recent Labs Lab 12/23/16 1855 12/27/16 0524  ALBUMIN 3.0* 2.9*   No results for input(s): LIPASE, AMYLASE in the last 168 hours. No results for input(s): AMMONIA in the last 168 hours. CBC:  Recent Labs Lab 12/23/16 1855 12/25/16 0654 12/26/16 0522  12/27/16 0524  WBC 4.1 4.0 3.9* 3.5*  NEUTROABS 3.0 3.0 2.7 2.3  HGB 8.4* 8.7* 8.6* 8.8*  HCT 27.9* 29.2* 28.1* 28.3*  MCV 94.9 95.1 94.9 95.0  PLT 226 208 221 214   Cardiac Enzymes: No results for input(s): CKTOTAL, CKMB, CKMBINDEX, TROPONINI in the last 168 hours. CBG:  Recent Labs Lab 12/26/16 1137 12/26/16 1634 12/26/16 2047 12/27/16 0738 12/27/16 1119  GLUCAP 292* 294* 221* 237* 259*    Iron Studies: No results for input(s): IRON, TIBC, TRANSFERRIN, FERRITIN in the last 72 hours. Studies/Results: US Renal  Result Date: 12/26/2016 CLINICAL DATA:  Acute onset of renal insufficiency. Initial encounter. EXAM: RENAL / URINARY TRACT ULTRASOUND COMPLETE COMPARISON:  Renal ultrasound performed 12/12/2014, and CT of the abdomen and pelvis from 03/20/2016 FINDINGS: Right Kidney: Length: 11.1 cm. Echogenicity within normal limits. No mass or hydronephrosis visualized. Left Kidney: Length: 10.0 cm. Echogenicity within normal limits. Left renal stones measure up to 6 mm in size. No mass or hydronephrosis visualized. Bladder: Decompressed and not well characterized. IMPRESSION: 1. No evidence of hydronephrosis.  2. Nonobstructing left renal stones measure up to 6 mm in size. Electronically Signed   By: Garald Balding M.D.   On: 12/26/2016 21:50   Dg Chest Port 1 View  Result Date: 12/26/2016 CLINICAL DATA:  Rhonchi EXAM: PORTABLE CHEST 1 VIEW COMPARISON:  12/23/2016 FINDINGS: There are layering bilateral pleural effusions, right greater than left, similar to prior study. Bilateral lower lobe airspace opacities, also greater on the right. This could reflect atelectasis or pneumonia. Vascular congestion and mild cardiomegaly. IMPRESSION: Findings similar to prior study with bilateral layering effusions and lower lobe airspace opacities, right greater than left. Cardiomegaly, vascular congestion. Electronically Signed   By: Rolm Baptise M.D.   On: 12/26/2016 09:43   . ALPRAZolam  0.5 mg Oral  QHS  . amiodarone  200 mg Oral Daily  . clopidogrel  75 mg Oral Daily  . cyclobenzaprine  5 mg Oral QHS  . darbepoetin (ARANESP) injection - NON-DIALYSIS  150 mcg Subcutaneous Q Mon-1800  . fluticasone  1 spray Each Nare Daily  . furosemide  160 mg Oral TID  . hydrALAZINE  50 mg Oral BID  . insulin aspart  0-15 Units Subcutaneous TID WC  . isosorbide mononitrate  30 mg Oral BID  . latanoprost  1 drop Both Eyes QHS  . loratadine  10 mg Oral Daily  . meclizine  12.5 mg Oral BID  . mouth rinse  15 mL Mouth Rinse BID  . metoprolol succinate  25 mg Oral Q breakfast  . pantoprazole  40 mg Oral Daily  . potassium chloride  30 mEq Oral BID  . rosuvastatin  40 mg Oral q1800  . senna-docusate  2 tablet Oral BID  . sodium chloride flush  3 mL Intravenous Q12H    BMET    Component Value Date/Time   NA 135 12/27/2016 0524   NA 141 02/18/2016 0937   K 4.2 12/27/2016 0524   CL 96 (L) 12/27/2016 0524   CO2 27 12/27/2016 0524   GLUCOSE 249 (H) 12/27/2016 0524   BUN 85 (H) 12/27/2016 0524   BUN 53 (H) 02/18/2016 0937   CREATININE 3.53 (H) 12/27/2016 0524   CREATININE 1.73 (H) 10/08/2013 1024   CALCIUM 9.1 12/27/2016 0524   GFRNONAA 12 (L) 12/27/2016 0524   GFRAA 14 (L) 12/27/2016 0524   CBC    Component Value Date/Time   WBC 3.5 (L) 12/27/2016 0524   RBC 2.98 (L) 12/27/2016 0524   HGB 8.8 (L) 12/27/2016 0524   HCT 28.3 (L) 12/27/2016 0524   PLT 214 12/27/2016 0524   MCV 95.0 12/27/2016 0524   MCH 29.5 12/27/2016 0524   MCHC 31.1 12/27/2016 0524   RDW 19.5 (H) 12/27/2016 0524   LYMPHSABS 0.8 12/27/2016 0524   MONOABS 0.4 12/27/2016 0524   EOSABS 0.1 12/27/2016 0524   BASOSABS 0.0 12/27/2016 0524     Assessment/Plan:  1. AKI with CKD. Creatinine stable, unable to calculate total output has patient is incontinent and using diapers. There was more than 500 mL since Monday. - Continue Lasix 160 mg 3 times a day. -Parathyroid labs are still pending. -VVS consulted for access  placement. Not sure this is AKI but progression of CKD.  With rapid recurrences, need to get ready for HD  2. HFrEF. Cardiology is following, continue gentle diuresis.  3. Anemia. Hemoglobin stable, did get her dose of Feraheme yesterday. Repeat iron studies. -Continue Aranesp 150 per week.  4. P Afib. Currently in sinus rhythm with rate in mid  50s. INR within normal range today, plan is to restart Coumadin. Currently on amiodarone and beta blocker.  5. DM. Per primary team. 6Obesity I have seen and examined this patient and agree with the plan of care seen, examined, eval, discussed with patient, resident and VVS .  Sidni Fusco L 12/27/2016, 4:22 PM

## 2016-12-27 NOTE — Progress Notes (Signed)
Pt complains of shortness of breath, lungs sounds clear, O2 sat at 100% on room air, tachypneic with RR at 24. Breathing treatment given with a little relief. Pt stated "this is just started when they told me about having a fistula in the future, I'm having panic attack now." Pt reassured. Will continue to monitor pt.

## 2016-12-28 ENCOUNTER — Inpatient Hospital Stay (HOSPITAL_COMMUNITY): Payer: BLUE CROSS/BLUE SHIELD

## 2016-12-28 DIAGNOSIS — I10 Essential (primary) hypertension: Secondary | ICD-10-CM

## 2016-12-28 LAB — CBC
HCT: 30.8 % — ABNORMAL LOW (ref 36.0–46.0)
HEMOGLOBIN: 9.2 g/dL — AB (ref 12.0–15.0)
MCH: 28.8 pg (ref 26.0–34.0)
MCHC: 29.9 g/dL — ABNORMAL LOW (ref 30.0–36.0)
MCV: 96.3 fL (ref 78.0–100.0)
Platelets: 236 10*3/uL (ref 150–400)
RBC: 3.2 MIL/uL — AB (ref 3.87–5.11)
RDW: 19.2 % — ABNORMAL HIGH (ref 11.5–15.5)
WBC: 4.3 10*3/uL (ref 4.0–10.5)

## 2016-12-28 LAB — BASIC METABOLIC PANEL
Anion gap: 11 (ref 5–15)
BUN: 74 mg/dL — AB (ref 6–20)
CHLORIDE: 98 mmol/L — AB (ref 101–111)
CO2: 27 mmol/L (ref 22–32)
Calcium: 9.2 mg/dL (ref 8.9–10.3)
Creatinine, Ser: 3.01 mg/dL — ABNORMAL HIGH (ref 0.44–1.00)
GFR calc non Af Amer: 14 mL/min — ABNORMAL LOW (ref >60)
GFR, EST AFRICAN AMERICAN: 17 mL/min — AB (ref >60)
Glucose, Bld: 267 mg/dL — ABNORMAL HIGH (ref 65–99)
POTASSIUM: 3.9 mmol/L (ref 3.5–5.1)
SODIUM: 136 mmol/L (ref 135–145)

## 2016-12-28 LAB — GLUCOSE, CAPILLARY
GLUCOSE-CAPILLARY: 353 mg/dL — AB (ref 65–99)
GLUCOSE-CAPILLARY: 229 mg/dL — AB (ref 65–99)
Glucose-Capillary: 272 mg/dL — ABNORMAL HIGH (ref 65–99)
Glucose-Capillary: 399 mg/dL — ABNORMAL HIGH (ref 65–99)

## 2016-12-28 LAB — HEPARIN LEVEL (UNFRACTIONATED): Heparin Unfractionated: 0.98 IU/mL — ABNORMAL HIGH (ref 0.30–0.70)

## 2016-12-28 LAB — PROTIME-INR
INR: 1.59
PROTHROMBIN TIME: 18.8 s — AB (ref 11.4–15.2)

## 2016-12-28 LAB — PARATHYROID HORMONE, INTACT (NO CA): PTH: 270 pg/mL — AB (ref 15–65)

## 2016-12-28 MED ORDER — INSULIN GLARGINE 100 UNIT/ML ~~LOC~~ SOLN
20.0000 [IU] | Freq: Every day | SUBCUTANEOUS | Status: DC
  Administered 2016-12-28 – 2016-12-29 (×2): 20 [IU] via SUBCUTANEOUS
  Filled 2016-12-28 (×3): qty 0.2

## 2016-12-28 MED ORDER — HEPARIN (PORCINE) IN NACL 100-0.45 UNIT/ML-% IJ SOLN: 1100.0000 [IU]/h | INTRAMUSCULAR | Status: DC

## 2016-12-28 MED ORDER — HEPARIN BOLUS VIA INFUSION
2000.0000 [IU] | Freq: Once | INTRAVENOUS | Status: AC
  Administered 2016-12-28: 2000 [IU] via INTRAVENOUS
  Filled 2016-12-28: qty 2000

## 2016-12-28 MED ORDER — HEPARIN (PORCINE) IN NACL 100-0.45 UNIT/ML-% IJ SOLN
1300.0000 [IU]/h | INTRAMUSCULAR | Status: DC
  Administered 2016-12-28: 1300 [IU]/h via INTRAVENOUS
  Filled 2016-12-28: qty 250

## 2016-12-28 MED ORDER — CEFAZOLIN SODIUM-DEXTROSE 2-4 GM/100ML-% IV SOLN: 2.0000 g | INTRAVENOUS | Status: DC

## 2016-12-28 MED ORDER — METOLAZONE 10 MG PO TABS
10.0000 mg | ORAL_TABLET | Freq: Every day | ORAL | Status: DC
  Administered 2016-12-28 – 2016-12-31 (×4): 10 mg via ORAL
  Filled 2016-12-28 (×4): qty 1

## 2016-12-28 NOTE — Evaluation (Signed)
Occupational Therapy Evaluation Patient Details Name: [name] MRN: [id number] DOB: November 23, 1945 Today's Date: 12/28/2016    History of Present Illness 71 y.o. female PMH of  CAD (s/p NSTEMI in 06/2015 with DES to PDA and RCA at that time, subtotal occlusion of LCX treated medically; repeat cath 11/2015 with stable disease; inferolateral scar w/o ischemia), chronic combined systolic and diastolic CHF (EF 81-44% by echo in 11/2016), paroxysmal atrial fibrillation, right subclavian stenosis (s/p intervention), IDDM, HTN, HLD, and CKD Stage IV admitted on 12/23/2016 for worsening edema and fatigue. Pt being treated for acute on chronic combined systolic and diastolic CHF, acute on chronic Stage 4 CKD, paroxysmal A-fib, CAD, anemia, hypoprothrobinemia, and IDDM.    Clinical Impression   PTA, pt was at [facility] for rehabilitation and utilizing RW for short distance ambulation. Pt reports she was able to dress her UB with set-up but required significant assistance with bathing and LB dressing. Prior to previous admission and D/C to SNF, pt was independent with ADL. Pt currently requires max assist for LB ADL, min guard assist for UB ADL, and min assist for toilet transfers with RW. Pt limited this session by abdominal pain and presented with generalized weakness also impacting her ability to participate in ADL at [facility]. Pt would benefit from continued OT services while admitted to improve independence and safety with ADL. Recommend return to SNF for continued rehabilitation post-acute D/C.    Follow Up Recommendations  SNF;Supervision/Assistance - 24 hour    Equipment Recommendations  Other (comment) (TBD at nexy venue of care)    Recommendations for Other Services       Precautions / Restrictions Precautions Precautions: Fall Restrictions Weight Bearing Restrictions: No      Mobility Bed Mobility               General bed mobility comments: Sitting at EOB on my  arrival  Transfers Overall transfer level: Needs assistance Equipment used: Rolling walker (2 wheeled) Transfers: Sit to/from Stand Sit to Stand: Min assist         General transfer comment: Min assist for power up and required 2 attempts for successful standing.     Balance Overall balance assessment: Needs assistance Sitting-balance support: No upper extremity supported;Feet supported Sitting balance-[name]: Good     Standing balance support: Bilateral upper extremity supported Standing balance-[name]: Poor Standing balance comment: requires UE support for maintaining balance                           ADL either performed or assessed with clinical judgement   ADL Overall ADL's : Needs assistance/impaired Eating/Feeding: Sitting;Minimal assistance Eating/Feeding Details (indicate cue type and reason): Assist to open containers Grooming: Set up;Sitting   Upper Body Bathing: Sitting;Min guard   Lower Body Bathing: Maximal assistance;Sit to/from stand   Upper Body Dressing : Min guard;Sitting   Lower Body Dressing: Maximal assistance;Sit to/from stand   Toilet Transfer: Minimal assistance;RW;Ambulation   Toileting- Clothing Manipulation and Hygiene: Moderate assistance;Sit to/from stand       Functional mobility during ADLs: Minimal assistance;Rolling walker General ADL Comments: Pt with diminished activity tolerance     Vision Baseline Vision/History: Wears glasses Wears Glasses:  (has both prescription glasses and reading glasses) Patient Visual Report: No change from baseline (recent cataract surgery) Vision Assessment?: No apparent visual deficits     Perception     Praxis      Pertinent Vitals/Pain Pain Assessment: 0-10 Pain  Score: 5  Pain Location: abdominal Pain Descriptors / Indicators: Cramping Pain Intervention(s): Limited activity within patient's tolerance;Monitored during session     Hand Dominance Right    Extremity/Trunk Assessment Upper Extremity Assessment Upper Extremity Assessment: RUE deficits/detail   Lower Extremity Assessment Lower Extremity Assessment: Defer to PT evaluation       Communication Communication Communication: No difficulties   Cognition Arousal/Alertness: Awake/alert Behavior During Therapy: WFL for tasks assessed/performed Overall Cognitive Status: Within Functional Limits for tasks assessed                                     General Comments       Exercises     Shoulder Instructions      Home Living Family/patient expects to be discharged to:: Skilled nursing facility Living Arrangements: Spouse/significant other;Children                                      Prior Functioning/Environment Level of Independence: Needs assistance  Gait / Transfers Assistance Needed: ambulates household distances with RW; uses w/c "sometimes" ADL's / Homemaking Assistance Needed: Assistance for bathing, LB dressing            OT Problem List: Decreased strength;Decreased activity tolerance;Impaired balance (sitting and/or standing);Decreased safety awareness;Decreased knowledge of use of DME or AE;Decreased knowledge of precautions;Pain      OT Treatment/Interventions: Self-care/ADL training;Therapeutic exercise;Energy conservation;DME and/or AE instruction;Therapeutic activities;Patient/family education;Balance training    OT Goals(Current goals can be found in the care plan section) Acute Rehab OT Goals Patient Stated Goal: Go to Rehab then home if possible OT Goal Formulation: With patient Time For Goal Achievement: 01/11/17 Potential to Achieve Goals: Good ADL Goals Pt Will Perform Grooming: with min assist;standing Pt Will Perform Lower Body Dressing: with min assist;with adaptive equipment;sit to/from stand Pt Will Transfer to Toilet: with supervision;ambulating;bedside commode (BSC over toilet) Pt Will Perform Toileting  - Clothing Manipulation and hygiene: with min guard assist;sit to/from stand  OT Frequency: Min 2X/week   Barriers to D/C:            Co-evaluation              AM-PAC PT "6 Clicks" Daily Activity     Outcome Measure Help from another person eating meals?: A Little Help from another person taking care of personal grooming?: A Little Help from another person toileting, which includes using toliet, bedpan, or urinal?: A Little Help from another person bathing (including washing, rinsing, drying)?: A Lot Help from another person to put on and taking off regular upper body clothing?: A Little Help from another person to put on and taking off regular lower body clothing?: A Lot 6 Click Score: 16   End of Session Equipment Utilized During Treatment: Rolling walker;Gait belt Nurse Communication: Mobility status  Activity Tolerance: Patient tolerated treatment well Patient left: with call bell/phone within reach;in bed (sitting at EOB)  OT Visit Diagnosis: Unsteadiness on feet (R26.81)                Time: 1324-4010 OT Time Calculation (min): 26 min Charges:  OT General Charges $OT Visit: 1 Visit OT Evaluation $OT Eval Moderate Complexity: 1 Mod OT Treatments $Self Care/Home Management : 8-22 mins G-Codes:     Norman Herrlich, MS OTR/L  Pager: [place] A  Piero Mustard 12/28/2016, 11:38 AM

## 2016-12-28 NOTE — Progress Notes (Signed)
Bilateral upper extremity vein mapping has been completed. Preliminary results can be found in chart review -> CV Proc  12/28/16 6:44 PM Carlos Levering RVT

## 2016-12-28 NOTE — Progress Notes (Signed)
PROGRESS NOTE    [name]  YQM:[id number] DOB: 1945/12/03 DOA: 12/23/2016 PCP: [name], MD   Brief Narrative: [name] is a 71 y.o. female with medical history significant for insulin-dependent diabetes mellitus, chronic kidney disease stage IV, chronic combined systolic/diastolic CHF, chronic atrial fibrillation, and coronary artery disease, now presenting into the emergency department at the direction of her nephrologist for evaluation of swelling and fatigue. Patient has been followed by cardiology and nephrology with worsening fluid status complicated by worsening kidney function. She has recently had her diuretics increased, but continues to be significantly up from her usual weight and her kidney function has worsened significantly over the past month or so. Patient denies any chest pain. She denies dyspnea while at rest. There has not been any fevers, chills, or significant cough. She reports fatigue, worsening progressively over the past couple months.     Assessment & Plan:   Principal Problem:   Acute on chronic combined systolic and diastolic CHF (congestive heart failure) (HCC) Active Problems:   Essential hypertension   Coronary artery disease   Elevated troponin   CKD (chronic kidney disease), stage IV (HCC)   DM type 2 causing vascular disease (HCC)   Pleural effusion on right   Anemia in chronic kidney disease   Atrial fibrillation (HCC)   Hypokalemia   Hypervolemia   Rhonchi   Acute on chronic combined systolic/diastolic CHF  Echo updated last month, EF 88-91%, grade 2 diastolic dysfunction, mild MR, severe LAE On oral lasix.  Nephrology helping with diuresis.   Acute kidney injury superimposed on CKD stage IV  Plan for graft during this admission.  On oral lasix.  Urine out put 1 L.   A fib; on heparin  Amiodarone,  CAD,  Plavix, metoprolol/   Diabetes; type 2;  Start lantus CBG above 300    DVT prophylaxis: heparin    Code Status: full code.  Family Communication: patient  Disposition Plan: home when stable.    Consultants:   nephrology    Procedures: none   Antimicrobials: none   Subjective: She had panic attack yesterday. She is breathing ok.    Objective: Vitals:   12/27/16 2101 12/28/16 0410 12/28/16 1300 12/28/16 1407  BP: (!) 154/63 (!) 147/68 (!) 164/69   Pulse: 65 62 87   Resp: 18 20 18    Temp: 97.9 F (36.6 C) 98 F (36.7 C) (!) 97.5 F (36.4 C)   TempSrc: Oral Oral Oral   SpO2: 100% 100% 100% 94%  Weight:  92.4 kg (203 lb 11.2 oz)    Height:        Intake/Output Summary (Last 24 hours) at 12/28/16 1433 Last data filed at 12/28/16 0410  Gross per 24 hour  Intake              480 ml  Output             1000 ml  Net             -520 ml   Filed Weights   12/26/16 0501 12/27/16 0550 12/28/16 0410  Weight: 92.4 kg (203 lb 12.8 oz) 93.1 kg (205 lb 4.8 oz) 92.4 kg (203 lb 11.2 oz)    Examination:  General exam: Appears calm and comfortable  Respiratory system: Clear to auscultation. Respiratory effort normal. Cardiovascular system: S1 & S2 heard, RRR. No JVD, murmurs, rubs, gallops or clicks. plus 2 edema  Gastrointestinal system: Abdomen is nondistended, soft and nontender. No organomegaly  or masses felt. Normal bowel sounds heard. Central nervous system: Alert and oriented. No focal neurological deficits. Extremities: Symmetric 5 x 5 power. Skin: No rashes, lesions or ulcers   Data Reviewed: I have personally reviewed following labs and imaging studies  CBC:  Recent Labs Lab 12/23/16 1855 12/25/16 0654 12/26/16 0522 12/27/16 0524 12/28/16 0504  WBC 4.1 4.0 3.9* 3.5* 4.3  NEUTROABS 3.0 3.0 2.7 2.3  --   HGB 8.4* 8.7* 8.6* 8.8* 9.2*  HCT 27.9* 29.2* 28.1* 28.3* 30.8*  MCV 94.9 95.1 94.9 95.0 96.3  PLT 226 208 221 214 169   Basic Metabolic Panel:  Recent Labs Lab 12/23/16 1855 12/24/16 0805 12/25/16 0654 12/26/16 0522 12/27/16 0524  12/28/16 0504  NA 136 136 137 136 135 136  K 3.1* 3.4* 3.3* 4.3 4.2 3.9  CL 94* 92* 97* 96* 96* 98*  CO2 28 26 27 27 27 27   GLUCOSE 147* 163* 108* 220* 249* 267*  BUN 104* 99* 96* 91* 85* 74*  CREATININE 4.71* 4.30* 3.83* 3.68* 3.53* 3.01*  CALCIUM 9.1 9.4 8.9 9.0 9.1 9.2  PHOS 4.6  --   --   --  3.6  --    GFR: Estimated Creatinine Clearance: 20.1 mL/min (A) (by C-G formula based on SCr of 3.01 mg/dL (H)). Liver Function Tests:  Recent Labs Lab 12/23/16 1855 12/27/16 0524  ALBUMIN 3.0* 2.9*   No results for input(s): LIPASE, AMYLASE in the last 168 hours. No results for input(s): AMMONIA in the last 168 hours. Coagulation Profile:  Recent Labs Lab 12/26/16 0522 12/26/16 1429 12/27/16 0524 12/27/16 1831 12/28/16 0504  INR 6.29* 5.15* 2.20 1.66 1.59   Cardiac Enzymes: No results for input(s): CKTOTAL, CKMB, CKMBINDEX, TROPONINI in the last 168 hours. BNP (last 3 results) No results for input(s): PROBNP in the last 8760 hours. HbA1C: No results for input(s): HGBA1C in the last 72 hours. CBG:  Recent Labs Lab 12/27/16 1119 12/27/16 1650 12/27/16 2134 12/28/16 0728 12/28/16 1124  GLUCAP 259* 298* 253* 272* 399*   Lipid Profile: No results for input(s): CHOL, HDL, LDLCALC, TRIG, CHOLHDL, LDLDIRECT in the last 72 hours. Thyroid Function Tests: No results for input(s): TSH, T4TOTAL, FREET4, T3FREE, THYROIDAB in the last 72 hours. Anemia Panel:  Recent Labs  12/27/16 1248  FERRITIN 643*  TIBC 316  IRON 208*   Sepsis Labs:  Recent Labs Lab 12/26/16 1429  PROCALCITON 0.15    Recent Results (from the past 240 hour(s))  MRSA PCR Screening     Status: None   Collection Time: 12/24/16  1:39 AM  Result Value Ref Range Status   MRSA by PCR NEGATIVE NEGATIVE Final    Comment:        The GeneXpert MRSA Assay (FDA approved for NASAL specimens only), is one component of a comprehensive MRSA colonization surveillance program. It is not intended to  diagnose MRSA infection nor to guide or monitor treatment for MRSA infections.          Radiology Studies: US Renal  Result Date: 12/26/2016 CLINICAL DATA:  Acute onset of renal insufficiency. Initial encounter. EXAM: RENAL / URINARY TRACT ULTRASOUND COMPLETE COMPARISON:  Renal ultrasound performed 12/12/2014, and CT of the abdomen and pelvis from 03/20/2016 FINDINGS: Right Kidney: Length: 11.1 cm. Echogenicity within normal limits. No mass or hydronephrosis visualized. Left Kidney: Length: 10.0 cm. Echogenicity within normal limits. Left renal stones measure up to 6 mm in size. No mass or hydronephrosis visualized. Bladder: Decompressed and not well characterized. IMPRESSION:  1. No evidence of hydronephrosis. 2. Nonobstructing left renal stones measure up to 6 mm in size. Electronically Signed   By: Garald Balding M.D.   On: 12/26/2016 21:50        Scheduled Meds: . ALPRAZolam  0.5 mg Oral QHS  . amiodarone  200 mg Oral Daily  . clopidogrel  75 mg Oral Daily  . cyclobenzaprine  5 mg Oral QHS  . darbepoetin (ARANESP) injection - NON-DIALYSIS  150 mcg Subcutaneous Q Mon-1800  . fluticasone  1 spray Each Nare Daily  . furosemide  160 mg Oral TID  . hydrALAZINE  50 mg Oral BID  . insulin aspart  0-15 Units Subcutaneous TID WC  . insulin glargine  20 Units Subcutaneous QHS  . isosorbide mononitrate  30 mg Oral BID  . latanoprost  1 drop Both Eyes QHS  . loratadine  10 mg Oral Daily  . meclizine  12.5 mg Oral BID  . mouth rinse  15 mL Mouth Rinse BID  . metoprolol succinate  25 mg Oral Q breakfast  . pantoprazole  40 mg Oral Daily  . potassium chloride  30 mEq Oral BID  . rosuvastatin  40 mg Oral q1800  . senna-docusate  2 tablet Oral BID  . sodium chloride flush  3 mL Intravenous Q12H   Continuous Infusions: . sodium chloride    . heparin 1,300 Units/hr (12/28/16 1001)     LOS: 5 days    Time spent: 35 minutes.     Elmarie Shiley, MD [facility] Pager  [phone]  If 7PM-7AM, please contact night-coverage [email or web address] Password [facility] 12/28/2016, 2:33 PM

## 2016-12-28 NOTE — Progress Notes (Addendum)
ANTICOAGULATION CONSULT NOTE -  Follow up Pharmacy Consult for: IV Heparin Indication: atrial fibrillation  Allergies  Allergen Reactions  . Ace Inhibitors Cough  . Amlodipine Other (See Comments)    Edema   . Codeine Rash    Patient Measurements: Height: 5\' 8"  (172.7 cm) Weight: 203 lb 11.2 oz (92.4 kg) IBW/kg (Calculated) : 63.9 Heparin Dosing Weight:  84 kg  Vital Signs: Temp: 98 F (36.7 C) (10/24 0410) Temp Source: Oral (10/24 0410) BP: 147/68 (10/24 0410) Pulse Rate: 62 (10/24 0410)  Labs:  Recent Labs  12/26/16 0522  12/27/16 0524 12/27/16 1831 12/28/16 0504  HGB 8.6*  --  8.8*  --  9.2*  HCT 28.1*  --  28.3*  --  30.8*  PLT 221  --  214  --  236  LABPROT 55.1*  < > 24.3* 19.5* 18.8*  INR 6.29*  < > 2.20 1.66 1.59  CREATININE 3.68*  --  3.53*  --  3.01*  < > = values in this interval not displayed.  Estimated Creatinine Clearance: 20.1 mL/min (A) (by C-G formula based on SCr of 3.01 mg/dL (H)).   Medical History: Past Medical History:  Diagnosis Date  . Acute renal failure superimposed on stage 4 chronic kidney disease ([place]) 07/04/2015  . Allergic rhinitis   . Anemia   . Anxiety   . CAD in native artery    NSTEMI 07/2015 s/p DES to RCA and posterior PDA, PCI 10/2015 with scoring balloon to 85% ISR of distal RCA), known LAD/Cx disease treated medically  . Carotid artery disease ([place])    Mild bilateral carotid disease (1-39% 07/2015)  . Chronic combined systolic and diastolic CHF (congestive heart failure) ([place])   . CKD (chronic kidney disease), stage IV ([place])   . Constipation   . Essential hypertension   . GERD (gastroesophageal reflux disease)   . History of hysterectomy   . Hyperlipidemia   . Low back pain   . Obesity   . 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 Osteoarthritis   . Overactive bladder   . PVC's (premature ventricular contractions)   . Type 2 diabetes  mellitus (H[facility])     Medications:  Prescriptions Prior to Admission  Medication Sig Dispense Refill Last Dose  . albuterol (PROVENTIL HFA;VENTOLIN HFA) 108 (90 Base) MCG/ACT inhaler Inhale 1-2 puffs into the lungs every 6 (six) hours as needed for wheezing or shortness of breath. 1 Inhaler 0 unknnown at prn  . albuterol (PROVENTIL) (2.5 MG/3ML) 0.083% nebulizer solution Take 3 mLs (2.5 mg total) by nebulization every 6 (six) hours as needed for wheezing or shortness of breath. 75 mL 1 unknown at prn  . ALPRAZolam (XANAX) 0.5 MG tablet Take 0.5 mg by mouth at bedtime.   12/22/2016  . amiodarone (PACERONE) 200 MG tablet Take 1 tablet (200 mg total) by mouth 2 (two) times daily. New medication to control your heart rate. 60 tablet 2 12/23/2016  . cetirizine (ZYRTEC) 10 MG tablet Take 5 mg by mouth daily.  3 12/23/2016  . clopidogrel (PLAVIX) 75 MG tablet Take 1 tablet (75 mg total) by mouth daily. 30 tablet 0 12/23/2016  . cyclobenzaprine (FLEXERIL) 5 MG tablet Take 5 mg by mouth as needed (lower back pain). osteoporosis with out current pathological fracture for 14 days  0 unknown at prn  . diazepam (VALIUM) 5 MG tablet Take 1 tablet (5 mg total) by mouth every 12 (  twelve) hours as needed for muscle spasms. 10 tablet 0 unknown at prn  . ferrous sulfate 325 (65 FE) MG tablet Take 325 mg by mouth daily with breakfast.    12/23/2016  . fluticasone (FLONASE) 50 MCG/ACT nasal spray Place 1 spray into both nostrils daily.   12/23/2016  . hydrALAZINE (APRESOLINE) 100 MG tablet Take 0.5 tablets (50 mg total) by mouth 2 (two) times daily. (Patient taking differently: Take 25 mg by mouth 2 (two) times daily. )   12/23/2016  . insulin glargine (LANTUS) 100 UNIT/ML injection Inject 0.3 mLs (30 Units total) into the skin at bedtime. (Patient taking differently: Inject 40 Units into the skin at bedtime. )   12/22/2016  . isosorbide mononitrate (IMDUR) 60 MG 24 hr tablet Take 0.5 tablets (30 mg total) by mouth daily.    12/23/2016  . latanoprost (XALATAN) 0.005 % ophthalmic solution Place 1 drop into both eyes at bedtime.    12/22/2016  . meclizine (ANTIVERT) 25 MG tablet Take 12.5 mg by mouth 2 (two) times daily.    12/23/2016  . metolazone (ZAROXOLYN) 2.5 MG tablet Take one tab by mouth x 3 days only, then only as directed by MD 10 tablet 0 12/23/2016  . metoprolol succinate (TOPROL-XL) 100 MG 24 hr tablet Take 1 tablet (100 mg total) by mouth daily with breakfast. Take with or immediately following a meal. 30 tablet 2 12/23/2016 at unknown  . nitroGLYCERIN (NITROSTAT) 0.4 MG SL tablet Place 1 tablet (0.4 mg total) under the tongue every 5 (five) minutes as needed for chest pain. 25 tablet 2 unknown at prn  . NOVOLOG FLEXPEN 100 UNIT/ML FlexPen Inject 10 Units into the skin See admin instructions. With meals hold if glucose is less than 150  Notify MD if glucose is less that 60 and greater than 400  0 12/23/2016  . pantoprazole (PROTONIX) 40 MG tablet Take 40 mg by mouth daily.    12/23/2016  . rosuvastatin (CRESTOR) 40 MG tablet Take 1 tablet (40 mg total) by mouth at bedtime. 30 tablet 0 12/22/2016  . torsemide (DEMADEX) 20 MG tablet Take 4 tablets (80 mg total) by mouth 2 (two) times daily. Pt is able to take an additional tablet daily for weight gain greater than 3lbs. (Patient taking differently: Take 100 mg by mouth See admin instructions. Take (100 mg) by mouth twice daily - may take an additional tablet (20 mg) daily for weight gain greater than 3lbs.) 240 tablet 2 12/23/2016  . torsemide (DEMADEX) 20 MG tablet Take 20 mg by mouth as needed. Every 24 hours as needed for weight gain greater that 3 lbs   unknown at prn   Scheduled:  . ALPRAZolam  0.5 mg Oral QHS  . amiodarone  200 mg Oral Daily  . clopidogrel  75 mg Oral Daily  . cyclobenzaprine  5 mg Oral QHS  . darbepoetin (ARANESP) injection - NON-DIALYSIS  150 mcg Subcutaneous Q Mon-1800  . fluticasone  1 spray Each Nare Daily  . furosemide  160 mg  Oral TID  . hydrALAZINE  50 mg Oral BID  . insulin aspart  0-15 Units Subcutaneous TID WC  . isosorbide mononitrate  30 mg Oral BID  . latanoprost  1 drop Both Eyes QHS  . loratadine  10 mg Oral Daily  . meclizine  12.5 mg Oral BID  . mouth rinse  15 mL Mouth Rinse BID  . metoprolol succinate  25 mg Oral Q breakfast  . pantoprazole  40  mg Oral Daily  . potassium chloride  30 mEq Oral BID  . rosuvastatin  40 mg Oral q1800  . senna-docusate  2 tablet Oral BID  . sodium chloride flush  3 mL Intravenous Q12H    Assessment: 71 y.o female on coumadin PTA for h/o atrial fibrillation.  INR on admit date 10/19 was supratherapeutic at 4.38 and continued to rise while coumadin held.  INR peaked at 6.29, then reversed with Vitamin K 5 mg po and 2.5 mg po given on 10/22.  [facility] needs pt off coumadin 2/2 scheduled for permanent dialyis access scheduled for 10/25. Pharmacy consulted to start IV heparin when INR =or <2.0  INR today is 1.59, H/H 9.2/30.3 improved but remains low/ stable. PLTC 236k is wnl.    PTA dose: alternating 3.75 and 7.5 doses, unknown when last dose pta was taken and continued to increase.    Goal of Therapy:  INR 2-3 Monitor platelets by anticoagulation protocol: Yes   Plan:  Heparin 2000 units IV x1 bolus Heparin drip 1300 units/hr  Heparin 8h after start of heparin  Daily heparin level, CBC, PT/INR  Thank you for allowing pharmacy to be part of this patients care team.  Nicole Cella, [place] Clinical Pharmacist Pager: [phone]-[phone] [phone]-[phone] phone [phone] or [phone] Main pharmacy [phone] 12/28/2016,8:37 AM

## 2016-12-28 NOTE — Progress Notes (Signed)
S:71 year old lady with a history of HFrEF, CKD, P Afib. Admitted for volume overload and worsening renal function.   she had a panic attack last night requiring transient use of BiPAP, according to patient she becomes very anxious after talking with multiple physicians and with the thought of getting closer to dialysis as she is being scheduled for aVF.patient was feeling better when seen this morning, she did talk with one of her friends who is on dialysis for 18 years and felt good since then.she denies any ongoing chest pain or shortness of breath. There is mild improvement in her creatinine from 3.53 to 3.01 today.no record of strict output as patient is incontinent.  O:BP (!) 164/69 (BP Location: Right Arm)   Pulse 87   Temp (!) 97.5 F (36.4 C) (Oral)   Resp 18   Ht 5\' 8"  (1.727 m)   Wt 203 lb 11.2 oz (92.4 kg)   SpO2 94%   BMI 30.97 kg/m   Intake/Output Summary (Last 24 hours) at 12/28/16 1410 Last data filed at 12/28/16 0410  Gross per 24 hour  Intake              480 ml  Output             1000 ml  Net             -520 ml   Intake/Output: I/O last 3 completed shifts: In: 960 [P.O.:960] Out: 1600 [Urine:1600]  Intake/Output this shift:  No intake/output data recorded. Weight change: -1 lb 9.6 oz (-0.726 kg) Gen: well-developed lady, in no acute distress. CVS: Regular rate and rhythm  Resp: bilaterally decreased breath sounds at bases with few crackles in mid to lower zone. Abd: soft, non tender, bowel sounds positive. Ext: 2+ lower extremity edema up to thighs with signs of venous stasis.   Recent Labs Lab 12/23/16 1855 12/24/16 0805 12/25/16 0654 12/26/16 0522 12/27/16 0524 12/28/16 0504  NA 136 136 137 136 135 136  K 3.1* 3.4* 3.3* 4.3 4.2 3.9  CL 94* 92* 97* 96* 96* 98*  CO2 28 26 27 27 27 27   GLUCOSE 147* 163* 108* 220* 249* 267*  BUN 104* 99* 96* 91* 85* 74*  CREATININE 4.71* 4.30* 3.83* 3.68* 3.53* 3.01*  ALBUMIN 3.0*  --   --   --  2.9*  --    CALCIUM 9.1 9.4 8.9 9.0 9.1 9.2  PHOS 4.6  --   --   --  3.6  --    Liver Function Tests:  Recent Labs Lab 12/23/16 1855 12/27/16 0524  ALBUMIN 3.0* 2.9*   No results for input(s): LIPASE, AMYLASE in the last 168 hours. No results for input(s): AMMONIA in the last 168 hours. CBC:  Recent Labs Lab 12/23/16 1855 12/25/16 0654 12/26/16 0522 12/27/16 0524 12/28/16 0504  WBC 4.1 4.0 3.9* 3.5* 4.3  NEUTROABS 3.0 3.0 2.7 2.3  --   HGB 8.4* 8.7* 8.6* 8.8* 9.2*  HCT 27.9* 29.2* 28.1* 28.3* 30.8*  MCV 94.9 95.1 94.9 95.0 96.3  PLT 226 208 221 214 236   Cardiac Enzymes: No results for input(s): CKTOTAL, CKMB, CKMBINDEX, TROPONINI in the last 168 hours. CBG:  Recent Labs Lab 12/27/16 1119 12/27/16 1650 12/27/16 2134 12/28/16 0728 12/28/16 1124  GLUCAP 259* 298* 253* 272* 399*    Iron Studies:  Recent Labs  12/27/16 1248  IRON 208*  TIBC 316  FERRITIN 643*   Studies/Results: US Renal  Result Date: 12/26/2016 CLINICAL DATA:  Acute  onset of renal insufficiency. Initial encounter. EXAM: RENAL / URINARY TRACT ULTRASOUND COMPLETE COMPARISON:  Renal ultrasound performed 12/12/2014, and CT of the abdomen and pelvis from 03/20/2016 FINDINGS: Right Kidney: Length: 11.1 cm. Echogenicity within normal limits. No mass or hydronephrosis visualized. Left Kidney: Length: 10.0 cm. Echogenicity within normal limits. Left renal stones measure up to 6 mm in size. No mass or hydronephrosis visualized. Bladder: Decompressed and not well characterized. IMPRESSION: 1. No evidence of hydronephrosis. 2. Nonobstructing left renal stones measure up to 6 mm in size. Electronically Signed   By: Garald Balding M.D.   On: 12/26/2016 21:50   . ALPRAZolam  0.5 mg Oral QHS  . amiodarone  200 mg Oral Daily  . clopidogrel  75 mg Oral Daily  . cyclobenzaprine  5 mg Oral QHS  . darbepoetin (ARANESP) injection - NON-DIALYSIS  150 mcg Subcutaneous Q Mon-1800  . fluticasone  1 spray Each Nare Daily  .  furosemide  160 mg Oral TID  . hydrALAZINE  50 mg Oral BID  . insulin aspart  0-15 Units Subcutaneous TID WC  . insulin glargine  20 Units Subcutaneous QHS  . isosorbide mononitrate  30 mg Oral BID  . latanoprost  1 drop Both Eyes QHS  . loratadine  10 mg Oral Daily  . meclizine  12.5 mg Oral BID  . mouth rinse  15 mL Mouth Rinse BID  . metoprolol succinate  25 mg Oral Q breakfast  . pantoprazole  40 mg Oral Daily  . potassium chloride  30 mEq Oral BID  . rosuvastatin  40 mg Oral q1800  . senna-docusate  2 tablet Oral BID  . sodium chloride flush  3 mL Intravenous Q12H    BMET    Component Value Date/Time   NA 136 12/28/2016 0504   NA 141 02/18/2016 0937   K 3.9 12/28/2016 0504   CL 98 (L) 12/28/2016 0504   CO2 27 12/28/2016 0504   GLUCOSE 267 (H) 12/28/2016 0504   BUN 74 (H) 12/28/2016 0504   BUN 53 (H) 02/18/2016 0937   CREATININE 3.01 (H) 12/28/2016 0504   CREATININE 1.73 (H) 10/08/2013 1024   CALCIUM 9.2 12/28/2016 0504   GFRNONAA 14 (L) 12/28/2016 0504   GFRAA 17 (L) 12/28/2016 0504   CBC    Component Value Date/Time   WBC 4.3 12/28/2016 0504   RBC 3.20 (L) 12/28/2016 0504   HGB 9.2 (L) 12/28/2016 0504   HCT 30.8 (L) 12/28/2016 0504   PLT 236 12/28/2016 0504   MCV 96.3 12/28/2016 0504   MCH 28.8 12/28/2016 0504   MCHC 29.9 (L) 12/28/2016 0504   RDW 19.2 (H) 12/28/2016 0504   LYMPHSABS 0.8 12/27/2016 0524   MONOABS 0.4 12/27/2016 0524   EOSABS 0.1 12/27/2016 0524   BASOSABS 0.0 12/27/2016 0524     Assessment/Plan:  1. AKI with CKD. Creatinine improving with diuresis. Weight is 2 pounds down. Unable to have strict output recorded because of her being incontinent. Parathyroid labs still pending. She is going for left sided aVF tomorrow morning. -Continue Lasix 160 mg 3 times a day. Add Metol  2. HFrEF. Cardiology is following, continue gentle diuresis.  3. Anemia. Hemoglobin  improving, at 9.2 today, did get one dose of Feraheme . Repeat iron studies  shows good iron stores. -Continue Aranesp 150 per week.  4. P Afib. Rate controlled with amiodarone and beta blocker. INR subtherapeutic, currently on heparin because of surgery tomorrow morning.  5.  DM. Per primary team.  6 Leg ulcers 7 HTN 8 Obesity    I have seen and examined this patient and agree with the plan of care seen, eval, examined, counseled patient and discussed with resident. .  Euclid Cassetta L 12/28/2016, 2:45 PM

## 2016-12-28 NOTE — Progress Notes (Signed)
Progress Note  Patient Name: [name] Date of Encounter: 12/28/2016  Primary Cardiologist: Bronson Ing  Subjective   After discussions re: access for HD she became anxious and very dyspneic. Better fater a period of BiPAP. Appears orthopneic today. No change in weight. Mediocre UO on very high dose oral furosemide, but unclear whether we are capturing full output due to incontinence. Still with severe dependent edema and marked JVD. HR in 60s and BP mildly elevated. Creatinine is better. INR subtherapeutic - heparin IV ordered.  Inpatient Medications    Scheduled Meds: . ALPRAZolam  0.5 mg Oral QHS  . amiodarone  200 mg Oral Daily  . clopidogrel  75 mg Oral Daily  . cyclobenzaprine  5 mg Oral QHS  . darbepoetin (ARANESP) injection - NON-DIALYSIS  150 mcg Subcutaneous Q Mon-1800  . fluticasone  1 spray Each Nare Daily  . furosemide  160 mg Oral TID  . hydrALAZINE  50 mg Oral BID  . insulin aspart  0-15 Units Subcutaneous TID WC  . isosorbide mononitrate  30 mg Oral BID  . latanoprost  1 drop Both Eyes QHS  . loratadine  10 mg Oral Daily  . meclizine  12.5 mg Oral BID  . mouth rinse  15 mL Mouth Rinse BID  . metoprolol succinate  25 mg Oral Q breakfast  . pantoprazole  40 mg Oral Daily  . potassium chloride  30 mEq Oral BID  . rosuvastatin  40 mg Oral q1800  . senna-docusate  2 tablet Oral BID  . sodium chloride flush  3 mL Intravenous Q12H   Continuous Infusions: . sodium chloride    . heparin 1,300 Units/hr (12/28/16 1001)   PRN Meds: sodium chloride, acetaminophen, albuterol, diazepam, ondansetron (ZOFRAN) IV, sodium chloride flush   Vital Signs    Vitals:   12/26/16 2053 12/27/16 0550 12/27/16 2101 12/28/16 0410  BP: 140/61 (!) 128/53 (!) 154/63 (!) 147/68  Pulse: (!) 55 (!) 55 65 62  Resp: 20 20 18 20   Temp: 98.2 F (36.8 C) 98.1 F (36.7 C) 97.9 F (36.6 C) 98 F (36.7 C)  TempSrc: Oral Oral Oral Oral  SpO2: 100% 99% 100% 100%  Weight:   205 lb 4.8 oz (93.1 kg)  203 lb 11.2 oz (92.4 kg)  Height:        Intake/Output Summary (Last 24 hours) at 12/28/16 1002 Last data filed at 12/28/16 0410  Gross per 24 hour  Intake              480 ml  Output             1000 ml  Net             -520 ml   Filed Weights   12/26/16 0501 12/27/16 0550 12/28/16 0410  Weight: 203 lb 12.8 oz (92.4 kg) 205 lb 4.8 oz (93.1 kg) 203 lb 11.2 oz (92.4 kg)    Telemetry    NSR - Personally Reviewed  ECG    No new tracing - Personally Reviewed  Physical Exam  Asleep,  Sitting on side of bed, 90 deg upright leaning on bed GEN: No acute distress.   Neck: 10 cm JVD Cardiac: RRR, no murmurs, rubs, or gallops.  Respiratory: diminished breath sounds in bases bilaterally. GI: Soft, nontender, non-distended  MS: 3+ soft pitting edema both calves, weeping from left calf; No deformity. Neuro:  Nonfocal  Psych: Normal affect   Labs    Chemistry Recent Labs Lab  12/23/16 1855  12/26/16 0522 12/27/16 0524 12/28/16 0504  NA 136  < > 136 135 136  K 3.1*  < > 4.3 4.2 3.9  CL 94*  < > 96* 96* 98*  CO2 28  < > 27 27 27   GLUCOSE 147*  < > 220* 249* 267*  BUN 104*  < > 91* 85* 74*  CREATININE 4.71*  < > 3.68* 3.53* 3.01*  CALCIUM 9.1  < > 9.0 9.1 9.2  ALBUMIN 3.0*  --   --  2.9*  --   GFRNONAA 8*  < > 11* 12* 14*  GFRAA 10*  < > 13* 14* 17*  ANIONGAP 14  < > 13 12 11   < > = values in this interval not displayed.   Hematology Recent Labs Lab 12/26/16 0522 12/27/16 0524 12/28/16 0504  WBC 3.9* 3.5* 4.3  RBC 2.96* 2.98* 3.20*  HGB 8.6* 8.8* 9.2*  HCT 28.1* 28.3* 30.8*  MCV 94.9 95.0 96.3  MCH 29.1 29.5 28.8  MCHC 30.6 31.1 29.9*  RDW 18.9* 19.5* 19.2*  PLT 221 214 236    Cardiac EnzymesNo results for input(s): TROPONINI in the last 168 hours.  Recent Labs Lab 12/23/16 1923  TROPIPOC 0.10*     BNP Recent Labs Lab 12/23/16 1855  BNP 2,761.4*     DDimer No results for input(s): DDIMER in the last 168 hours.    Radiology    US Renal  Result Date: 12/26/2016 CLINICAL DATA:  Acute onset of renal insufficiency. Initial encounter. EXAM: RENAL / URINARY TRACT ULTRASOUND COMPLETE COMPARISON:  Renal ultrasound performed 12/12/2014, and CT of the abdomen and pelvis from 03/20/2016 FINDINGS: Right Kidney: Length: 11.1 cm. Echogenicity within normal limits. No mass or hydronephrosis visualized. Left Kidney: Length: 10.0 cm. Echogenicity within normal limits. Left renal stones measure up to 6 mm in size. No mass or hydronephrosis visualized. Bladder: Decompressed and not well characterized. IMPRESSION: 1. No evidence of hydronephrosis. 2. Nonobstructing left renal stones measure up to 6 mm in size. Electronically Signed   By: Garald Balding M.D.   On: 12/26/2016 21:50    Cardiac Studies   Echocardiogram: 11/2016 Study Conclusions  - Left ventricle: The cavity size was normal. Wall thickness was increased in a pattern of moderate LVH. Systolic function was moderately to severely reduced. The estimated ejection fraction was in the range of 30% to 35%. Features are consistent with a pseudonormal left ventricular filling pattern, with concomitant abnormal relaxation and increased filling pressure (grade 2 diastolic dysfunction). Doppler parameters are consistent with high ventricular filling pressure. - Regional wall motion abnormality: Hypokinesis of the mid inferior and basal-mid inferolateral myocardium. - Aortic valve: Mildly calcified annulus. Trileaflet; mildly thickened leaflets. Valve area (VTI): 1.57 cm^2. Valve area (Vmax): 1.57 cm^2. Valve area (Vmean): 1.51 cm^2. - Mitral valve: Mildly calcified annulus. Mildly thickened leaflets . There was mild regurgitation. - Left atrium: The atrium was severely dilated. - Right atrium: The atrium was moderately dilated. - Pulmonary arteries: Systolic pressure was moderately increased. PA peak pressure: 44 mm Hg  (S).   Patient Profile     71 y.o. female PMH of CAD (s/p NSTEMI in 06/2015 with DES to PDA and RCA at that time, subtotal occlusion of LCX treated medically; repeat cath 9/2017with stable disease; inferolateral scar w/o ischemia),chronic combined systolic and diastolic CHF (EF 47-82% by echo in 11/2016), paroxysmal atrial fibrillation, right subclavian stenosis (s/p intervention), IDDM, HTN, HLD, and CKD Stage IV admitted on 12/23/2016 for worsening edema  and fatigue.   Assessment & Plan    1. Acute on Chronic Combined Systolic and Diastolic CHF - remains grossly hypervolemic and symptomatic. Weight up and down, but essentially unchanged in last 5 days. Not on RAAS inhibitors due to advanced kidney disease. Dose of beta blocker decreased to allow better renal perfusion. Continue Toprol-XL 25mg  daily, Imdur 30mg  BID, and Hydralazine 50mg  BID.  2. Acute on Chronic Stage 4 CKD  - hard to measure UO, but no longer oliguric. Mild improvement in creatinine. 3. Paroxysmal Atrial Fibrillation  - in NSR. Warfarin on hold (INR now subtherapeutic) in anticipation of AVF surgery. 4. CAD - remains angina free - recent NSTEMI, but nuclear stress test did not show any evidence of ischemic salvageable myocardium. Does not have angina pectoris. No plan for any contrast based procedures. - continue Plavix, BB, Imdur, and statin therapy.  5. 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 6. DM  For questions or updates, please contact [facility] Please consult [email or web address] for contact info under Cardiology/STEMI.      Signed, Sanda Klein, MD  12/28/2016, 10:02 AM

## 2016-12-28 NOTE — Progress Notes (Signed)
Pharmacist Heart Failure Core Measure Documentation  Assessment: [name] has an EF documented as 30-35% by echo 11/2016.  Rationale: Heart failure patients with left ventricular systolic dysfunction (LVSD) and an EF < 40% should be prescribed an angiotensin converting enzyme inhibitor (ACEI) or angiotensin receptor blocker (ARB) at discharge unless a contraindication is documented in the medical record.  This patient is not currently on an ACEI or ARB for HF. This note is being placed in the record in order to provide documentation that a contraindication to the use of these agents is present for this encounter.  ACE Inhibitor or Angiotensin Receptor Blocker is contraindicated (specify all that apply)  []   ACEI allergy AND ARB allergy []   Angioedema []   Moderate or severe aortic stenosis []   Hyperkalemia []   Hypotension []   Renal artery stenosis [x]   Worsening renal function, preexisting renal disease or dysfunction  Nicole Cella, [place]  12/28/2016 5:12 PM

## 2016-12-28 NOTE — Progress Notes (Signed)
ANTICOAGULATION CONSULT NOTE -  Follow up Pharmacy Consult for: IV Heparin  Indication: atrial fibrillation  Allergies  Allergen Reactions  . Ace Inhibitors Cough  . Amlodipine Swelling    UNSPECIFIED EDEMA   . Codeine Rash    Patient Measurements: Height: 5\' 8"  (172.7 cm) Weight: 203 lb 11.2 oz (92.4 kg) IBW/kg (Calculated) : 63.9 Heparin Dosing Weight:  84 kg  Vital Signs: Temp: 97.5 F (36.4 C) (10/24 1300) Temp Source: Oral (10/24 1300) BP: 164/69 (10/24 1300) Pulse Rate: 87 (10/24 1300)  Labs:  Recent Labs  12/26/16 0522  12/27/16 0524 12/27/16 1831 12/28/16 0504 12/28/16 2000  HGB 8.6*  --  8.8*  --  9.2*  --   HCT 28.1*  --  28.3*  --  30.8*  --   PLT 221  --  214  --  236  --   LABPROT 55.1*  < > 24.3* 19.5* 18.8*  --   INR 6.29*  < > 2.20 1.66 1.59  --   HEPARINUNFRC  --   --   --   --   --  0.98*  CREATININE 3.68*  --  3.53*  --  3.01*  --   < > = values in this interval not displayed.  Estimated Creatinine Clearance: 20.1 mL/min (A) (by C-G formula based on SCr of 3.01 mg/dL (H)).   Medications:  . sodium chloride    . [START ON 12/29/2016]  ceFAZolin (ANCEF) IV    . heparin 1,100 Units/hr (12/28/16 2130)     Assessment: 71 y.o female on coumadin PTA for h/o atrial fibrillation.  INR on admit date 10/19 was supratherapeutic at 4.38 and continued to rise while coumadin held.  INR peaked at 6.29, then reversed with Vitamin K 5 mg po and 2.5 mg po given on 10/22.  [facility] needs pt off coumadin 2/2 scheduled for permanent dialyis access scheduled for 10/25. Pharmacy consulted to start IV heparin when INR =or <2.0  INR today is 1.59, H/H 9.2/30.3 improved but remains low/ stable. PLTC 236k is wnl.   PTA dose: alternating 3.75 and 7.5 doses, unknown when last dose pta was taken and continued to increase.   PM f/u > heparin level slightly above goal.  No overt bleeding or complications noted.   Goal of Therapy:  INR 2-3  Heparin level  0.3-0.7 Monitor platelets by anticoagulation protocol: Yes   Plan:  Decrease IV heparin to 1100 units/hr. Recheck heparin level in 8 hrs. Daily heparin level and CBC.  Uvaldo Rising, BCPS  Clinical Pharmacist Pager [phone]  12/28/2016 9:12 PM

## 2016-12-28 NOTE — Clinical Social Work Note (Signed)
Per MD, patient could potentially be ready for discharge Friday or Saturday. CSW notified admissions coordinator at SNF. She will start insurance authorization tomorrow.  Dayton Scrape, [place]

## 2016-12-29 ENCOUNTER — Inpatient Hospital Stay (HOSPITAL_COMMUNITY): Payer: BLUE CROSS/BLUE SHIELD | Admitting: Anesthesiology

## 2016-12-29 ENCOUNTER — Encounter (HOSPITAL_COMMUNITY)
Admission: EM | Disposition: A | Discharge status: Home Health | Payer: Self-pay | Source: Home / Self Care | Attending: Internal Medicine

## 2016-12-29 ENCOUNTER — Encounter (HOSPITAL_COMMUNITY): Payer: Self-pay | Admitting: Anesthesiology

## 2016-12-29 LAB — CBC
HCT: 30.6 % — ABNORMAL LOW (ref 36.0–46.0)
HEMOGLOBIN: 9.1 g/dL — AB (ref 12.0–15.0)
MCH: 28.9 pg (ref 26.0–34.0)
MCHC: 29.7 g/dL — AB (ref 30.0–36.0)
MCV: 97.1 fL (ref 78.0–100.0)
Platelets: 227 10*3/uL (ref 150–400)
RBC: 3.15 MIL/uL — AB (ref 3.87–5.11)
RDW: 19.5 % — ABNORMAL HIGH (ref 11.5–15.5)
WBC: 4.5 10*3/uL (ref 4.0–10.5)

## 2016-12-29 LAB — BASIC METABOLIC PANEL
ANION GAP: 11 (ref 5–15)
BUN: 71 mg/dL — ABNORMAL HIGH (ref 6–20)
CHLORIDE: 99 mmol/L — AB (ref 101–111)
CO2: 29 mmol/L (ref 22–32)
CREATININE: 2.89 mg/dL — AB (ref 0.44–1.00)
Calcium: 9.4 mg/dL (ref 8.9–10.3)
GFR calc non Af Amer: 15 mL/min — ABNORMAL LOW (ref >60)
GFR, EST AFRICAN AMERICAN: 18 mL/min — AB (ref >60)
GLUCOSE: 203 mg/dL — AB (ref 65–99)
Potassium: 4 mmol/L (ref 3.5–5.1)
Sodium: 139 mmol/L (ref 135–145)

## 2016-12-29 LAB — SURGICAL PCR SCREEN
MRSA, PCR: NEGATIVE
STAPHYLOCOCCUS AUREUS: NEGATIVE

## 2016-12-29 LAB — GLUCOSE, CAPILLARY
GLUCOSE-CAPILLARY: 335 mg/dL — AB (ref 65–99)
GLUCOSE-CAPILLARY: 349 mg/dL — AB (ref 65–99)
Glucose-Capillary: 111 mg/dL — ABNORMAL HIGH (ref 65–99)

## 2016-12-29 LAB — HEPARIN LEVEL (UNFRACTIONATED): Heparin Unfractionated: 0.95 IU/mL — ABNORMAL HIGH (ref 0.30–0.70)

## 2016-12-29 LAB — PROTIME-INR
INR: 2.33
INR: 2.19
Prothrombin Time: 25.4 seconds — ABNORMAL HIGH (ref 11.4–15.2)
Prothrombin Time: 24.2 seconds — ABNORMAL HIGH (ref 11.4–15.2)

## 2016-12-29 SURGERY — ARTERIOVENOUS (AV) FISTULA CREATION
Anesthesia: Monitor Anesthesia Care | Laterality: Left

## 2016-12-29 MED ORDER — HEPARIN (PORCINE) IN NACL 100-0.45 UNIT/ML-% IJ SOLN: 900.0000 [IU]/h | INTRAMUSCULAR | Status: DC

## 2016-12-29 MED ORDER — PHYTONADIONE 5 MG PO TABS
10.0000 mg | ORAL_TABLET | Freq: Once | ORAL | Status: AC
  Administered 2016-12-29: 10 mg via ORAL
  Filled 2016-12-29: qty 2

## 2016-12-29 MED ORDER — CEFAZOLIN SODIUM-DEXTROSE 2-4 GM/100ML-% IV SOLN
2.0000 g | INTRAVENOUS | Status: AC
  Administered 2016-12-30: 2 g via INTRAVENOUS
  Filled 2016-12-29 (×2): qty 100

## 2016-12-29 MED ORDER — HEPARIN (PORCINE) IN NACL 100-0.45 UNIT/ML-% IJ SOLN
900.0000 [IU]/h | INTRAMUSCULAR | Status: DC
  Administered 2016-12-29 – 2016-12-30 (×2): 900 [IU]/h via INTRAVENOUS
  Filled 2016-12-29 (×2): qty 250

## 2016-12-29 MED ORDER — CALCITRIOL 0.5 MCG PO CAPS
0.5000 ug | ORAL_CAPSULE | Freq: Every day | ORAL | Status: DC
Start: 2016-12-29 — End: 2017-01-17
  Administered 2016-12-29 – 2017-01-17 (×16): 0.5 ug via ORAL
  Filled 2016-12-29 (×17): qty 1

## 2016-12-29 NOTE — Progress Notes (Signed)
ANTICOAGULATION CONSULT NOTE -  Follow up Pharmacy Consult for: IV Heparin  Indication: atrial fibrillation  Allergies  Allergen Reactions  . Ace Inhibitors Cough  . Amlodipine Swelling    UNSPECIFIED EDEMA   . Codeine Rash    Patient Measurements: Height: 5\' 8"  (172.7 cm) Weight: 198 lb 9.6 oz (90.1 kg) IBW/kg (Calculated) : 63.9 Heparin Dosing Weight:  84 kg  Vital Signs: Temp: 98.6 F (37 C) (10/25 0604) Temp Source: Oral (10/25 0604) BP: 126/51 (10/25 1031) Pulse Rate: 64 (10/25 1031)  Labs:  Recent Labs  12/27/16 0524  12/28/16 0504 12/28/16 2000 12/29/16 0435 12/29/16 0709  HGB 8.8*  --  9.2*  --  9.1*  --   HCT 28.3*  --  30.8*  --  30.6*  --   PLT 214  --  236  --  227  --   LABPROT 24.3*  < > 18.8*  --  25.4* 24.2*  INR 2.20  < > 1.59  --  2.33 2.19  HEPARINUNFRC  --   --   --  0.98* 0.95*  --   CREATININE 3.53*  --  3.01*  --  2.89*  --   < > = values in this interval not displayed.  Estimated Creatinine Clearance: 20.7 mL/min (A) (by C-G formula based on SCr of 2.89 mg/dL (H)).   Medications:  . sodium chloride    . heparin 900 Units/hr (12/29/16 1115)     Assessment: 71 y.o female on coumadin PTA for h/o atrial fibrillation.  INR on admit date 10/19 was supratherapeutic at 4.38 and continued to rise while coumadin held.  INR peaked at 6.29, then reversed with Vitamin K 5 mg po and 2.5 mg po given on 10/22.  [facility] needs pt off coumadin 2/2 scheduled for permanent dialyis access scheduled for 10/25. Pharmacy consulted to start IV heparin when INR =or <2.0.  Heparin drip started yesterday,10/25 due to INR was1.59.   Today INR up to 2.19 despite off coumadin and Vitamin K given on 10/22.  HL =0.95 on heparin drip 1100 units/hr. Heparin drip has been off since ~ 05:45 AM for OR. HL drawn prior to heparin stopped. HD cath placement originally scheduled today, but was postponed until tomorrow due to INR 2.19 today.  MD ordered to give Vitamin K 10mg  po  x1 today, rechecking INR at 18:00 tonight. Heparin drip to resume.  H/H remains low /stable and  PLTC is wnl.  No bleeding reported per RN.   PTA dose: alternating 3.75 and 7.5 doses, unknown when last dose pta was taken and continued to increase.    Goal of Therapy:  INR 2-3  Heparin level 0.3-0.7 Monitor platelets by anticoagulation protocol: Yes   Plan:  Resume IV heparin at lower rate 900 units/hr. Recheck heparin level in ~ 6 hrs at 18:00 tonight with previously ordered INR (post Vitamin K today per MD). Daily heparin level, INR and CBC Coumadin remains on hold -f/u for restart after HD cath placement done  Nicole Cella, [place] Clinical Pharmacist Pager: [phone] phone [phone] or [phone] Main pharmacy [phone]  12/29/2016 11:21 AM

## 2016-12-29 NOTE — Progress Notes (Addendum)
  Progress Note    12/29/2016 7:02 AM Day of Surgery  Subjective:  No acute issues  Vitals:   12/28/16 2135 12/29/16 0604  BP: (!) 131/51 (!) 147/64  Pulse: (!) 59 (!) 59  Resp: 16 16  Temp: 98.2 F (36.8 C) 98.6 F (37 C)  SpO2: 100% 99%    Physical Exam: aaox3 Non labored respirations Palpable left radial pulse  CBC    Component Value Date/Time   WBC 4.5 12/29/2016 0435   RBC 3.15 (L) 12/29/2016 0435   HGB 9.1 (L) 12/29/2016 0435   HCT 30.6 (L) 12/29/2016 0435   PLT 227 12/29/2016 0435   MCV 97.1 12/29/2016 0435   MCH 28.9 12/29/2016 0435   MCHC 29.7 (L) 12/29/2016 0435   RDW 19.5 (H) 12/29/2016 0435   LYMPHSABS 0.8 12/27/2016 0524   MONOABS 0.4 12/27/2016 0524   EOSABS 0.1 12/27/2016 0524   BASOSABS 0.0 12/27/2016 0524    BMET    Component Value Date/Time   NA 139 12/29/2016 0435   NA 141 02/18/2016 0937   K 4.0 12/29/2016 0435   CL 99 (L) 12/29/2016 0435   CO2 29 12/29/2016 0435   GLUCOSE 203 (H) 12/29/2016 0435   BUN 71 (H) 12/29/2016 0435   BUN 53 (H) 02/18/2016 0937   CREATININE 2.89 (H) 12/29/2016 0435   CREATININE 1.73 (H) 10/08/2013 1024   CALCIUM 9.4 12/29/2016 0435   GFRNONAA 15 (L) 12/29/2016 0435   GFRAA 18 (L) 12/29/2016 0435    INR    Component Value Date/Time   INR 2.33 12/29/2016 0435     Intake/Output Summary (Last 24 hours) at 12/29/16 4540 Last data filed at 12/29/16 0600  Gross per 24 hour  Intake              240 ml  Output             2301 ml  Net            -2061 ml     Assessment:  71 y.o. female is here for left sided access. Does not have vein for access.  Plan: inr now 2.3. Will recheck this a.m.  Sharea Guinther C. Donzetta Matters, MD [facility] Office: [phone] Pager: [phone]  12/29/2016 7:02 AM   inr recheck 2.19 will cancel today. Can reschedule when INR corrected or as outpatient if need be.   Servando Snare

## 2016-12-29 NOTE — Progress Notes (Signed)
Progress Note  Patient Name: [name] Date of Encounter: 12/29/2016  Primary Cardiologist: Dr. Bronson Ing   Subjective   Feels better today. Less anxious about HD after talking on phone with a friend who has been on HD x 18 years. Breathing is stable. Still with bilateral LEE. She denies CP.   Inpatient Medications    Scheduled Meds: . ALPRAZolam  0.5 mg Oral QHS  . amiodarone  200 mg Oral Daily  . clopidogrel  75 mg Oral Daily  . cyclobenzaprine  5 mg Oral QHS  . darbepoetin (ARANESP) injection - NON-DIALYSIS  150 mcg Subcutaneous Q Mon-1800  . fluticasone  1 spray Each Nare Daily  . furosemide  160 mg Oral TID  . hydrALAZINE  50 mg Oral BID  . insulin aspart  0-15 Units Subcutaneous TID WC  . insulin glargine  20 Units Subcutaneous QHS  . isosorbide mononitrate  30 mg Oral BID  . latanoprost  1 drop Both Eyes QHS  . loratadine  10 mg Oral Daily  . meclizine  12.5 mg Oral BID  . mouth rinse  15 mL Mouth Rinse BID  . metolazone  10 mg Oral Daily  . metoprolol succinate  25 mg Oral Q breakfast  . pantoprazole  40 mg Oral Daily  . phytonadione  10 mg Oral Once  . potassium chloride  30 mEq Oral BID  . rosuvastatin  40 mg Oral q1800  . senna-docusate  2 tablet Oral BID  . sodium chloride flush  3 mL Intravenous Q12H   Continuous Infusions: . sodium chloride    . [START ON 12/30/2016] heparin     PRN Meds: sodium chloride, acetaminophen, albuterol, diazepam, ondansetron (ZOFRAN) IV, sodium chloride flush   Vital Signs    Vitals:   12/28/16 1407 12/28/16 2135 12/29/16 0604 12/29/16 1031  BP:  (!) 131/51 (!) 147/64 (!) 126/51  Pulse:  (!) 59 (!) 59 64  Resp:  16 16   Temp:  98.2 F (36.8 C) 98.6 F (37 C)   TempSrc:  Oral Oral   SpO2: 94% 100% 99% 100%  Weight:   198 lb 9.6 oz (90.1 kg)   Height:        Intake/Output Summary (Last 24 hours) at 12/29/16 1047 Last data filed at 12/29/16 1005  Gross per 24 hour  Intake              480 ml  Output              2001 ml  Net            -1521 ml   Filed Weights   12/27/16 0550 12/28/16 0410 12/29/16 0604  Weight: 205 lb 4.8 oz (93.1 kg) 203 lb 11.2 oz (92.4 kg) 198 lb 9.6 oz (90.1 kg)    Telemetry    NSR - Personally Reviewed  ECG    NSR- Personally Reviewed  Physical Exam   GEN: No acute distress.   Neck: No JVD Cardiac: RRR, no murmurs, rubs, or gallops.  Respiratory: decreased BS at the bases bilaterally GI: Soft, nontender, non-distended  MS: 3+ bilateral LE edema; No deformity. Neuro:  Nonfocal  Psych: Normal affect   Labs    Chemistry Recent Labs Lab 12/23/16 1855  12/27/16 0524 12/28/16 0504 12/29/16 0435  NA 136  < > 135 136 139  K 3.1*  < > 4.2 3.9 4.0  CL 94*  < > 96* 98* 99*  CO2 28  < >  27 27 29   GLUCOSE 147*  < > 249* 267* 203*  BUN 104*  < > 85* 74* 71*  CREATININE 4.71*  < > 3.53* 3.01* 2.89*  CALCIUM 9.1  < > 9.1 9.2 9.4  ALBUMIN 3.0*  --  2.9*  --   --   GFRNONAA 8*  < > 12* 14* 15*  GFRAA 10*  < > 14* 17* 18*  ANIONGAP 14  < > 12 11 11   < > = values in this interval not displayed.   Hematology Recent Labs Lab 12/27/16 0524 12/28/16 0504 12/29/16 0435  WBC 3.5* 4.3 4.5  RBC 2.98* 3.20* 3.15*  HGB 8.8* 9.2* 9.1*  HCT 28.3* 30.8* 30.6*  MCV 95.0 96.3 97.1  MCH 29.5 28.8 28.9  MCHC 31.1 29.9* 29.7*  RDW 19.5* 19.2* 19.5*  PLT 214 236 227    Cardiac EnzymesNo results for input(s): TROPONINI in the last 168 hours.  Recent Labs Lab 12/23/16 1923  TROPIPOC 0.10*     BNP Recent Labs Lab 12/23/16 1855  BNP 2,761.4*     DDimer No results for input(s): DDIMER in the last 168 hours.   Radiology    No results found.  Cardiac Studies   Most Recent 2D echo 11/11/16 Study Conclusions  - Left ventricle: The cavity size was normal. Wall thickness was   increased in a pattern of moderate LVH. Systolic function was   moderately to severely reduced. The estimated ejection fraction   was in the range of 30% to 35%. Features  are consistent with a   pseudonormal left ventricular filling pattern, with concomitant   abnormal relaxation and increased filling pressure (grade 2   diastolic dysfunction). Doppler parameters are consistent with   high ventricular filling pressure. - Regional wall motion abnormality: Hypokinesis of the mid inferior   and basal-mid inferolateral myocardium. - Aortic valve: Mildly calcified annulus. Trileaflet; mildly   thickened leaflets. Valve area (VTI): 1.57 cm^2. Valve area   (Vmax): 1.57 cm^2. Valve area (Vmean): 1.51 cm^2. - Mitral valve: Mildly calcified annulus. Mildly thickened leaflets   . There was mild regurgitation. - Left atrium: The atrium was severely dilated. - Right atrium: The atrium was moderately dilated. - Pulmonary arteries: Systolic pressure was moderately increased.   PA peak pressure: 44 mm Hg (S).   Patient Profile     71 y.o. female PMH of CAD (s/p NSTEMI in 06/2015 with DES to PDA and RCA at that time, subtotal occlusion of LCX treated medically; repeat cath 9/2017with stable disease; inferolateral scar w/o ischemia),chronic combined systolic and diastolic CHF (EF 88-41% by echo in 11/2016), paroxysmal atrial fibrillation, right subclavian stenosis (s/p intervention), IDDM, HTN, HLD, and CKD StageIV admitted on 12/23/2016 for worsening edema and fatigue.   Assessment & Plan    1. Acute on Chronic Combined Systolic and Diastolic CHF: weight is down from yesterday, 203>>198 lb. Total UOP yesterday 2.3L. Still massively volume overloaded. SCr improving with diuresis. Nephrology assisting with diuretic dosing. Electrolytes and BP stable. Continue IV Lasix 160 mg TID. Continue strict I/Os and daily weights. No ACE/ARBs given CKD. Continue hydralazine and nitrate in place of ACE/ARB. Continue BB.    2. Acute on Chronic Stage 4 CKD: SCr appears to be gradually improving, down from 3.53>>3.01>>2.89. Nephrology following. Plan is for AVF surgery for HD access once  INR is low enough.   3. PAF:  NSR on telemetry. Continue amiodarone and metoprolol. Monitor K with diuresis. Coumadin on hold for anticipated AVF surgery.  INR is 2.89 today.   4. CAD:  s/p NSTEMI in 06/2015 with DES to PDA and RCA at that time, subtotal occlusion of LCX treated medically; repeat cath 9/2017with stable disease. Recent NSTEMI in September, however nuclear stress 11/15/16 test did not show any evidence of ischemic salvageable myocardium. She denies CP. Continue medical therapy with Plavix. No ASA given pt is also on Coumadin for PAF. Continue BB, statin and LA nitrate.   5. DM: management per primary.   6. Anemia: Hgb stable at 9.1. Multifactorial, in the setting of CKD and IDA. Has received IV Fe. Management per IM.    7. Anxiety: pt had panic attacks yesterday given anxiety over her current medical state and need HD in the future. Pt feeling better today after talking on phone with friend who has been on HD x 18 years.   For questions or updates, please contact [place] Please consult [email or web address] for contact info under Cardiology/STEMI.      Signed, Lyda Jester, PA-C  12/29/2016, 10:47 AM    I have seen and examined the patient along with Lyda Jester, PA-C .  I have reviewed the chart, notes and new data.  I agree with PA's note.  Key new complaints: breathing better, less anxious Key examination changes: marked improvement in UO and reduction in weight and improved edema, JVP still elevated. Key new findings / data: improving renal function.  PLAN: Continue same cardiac meds, diuretics per Nephrology.  Sanda Klein, MD, [place] [phone] 12/29/2016, 11:29 AM

## 2016-12-29 NOTE — Anesthesia Preprocedure Evaluation (Deleted)
Anesthesia Evaluation  Patient identified by MRN, date of birth, ID band Patient awake    Reviewed: Allergy & Precautions, NPO status , Patient's Chart, lab work & pertinent test results  Airway Mallampati: II  TM Distance: >3 FB Neck ROM: Full    Dental no notable dental hx. (+) Edentulous Upper, Edentulous Lower   Pulmonary neg pulmonary ROS,    Pulmonary exam normal breath sounds clear to auscultation       Cardiovascular hypertension, Pt. on medications + CAD, + Past MI and + Cardiac Stents (2017)  Normal cardiovascular exam Rhythm:Regular Rate:Normal  Long QT   Neuro/Psych negative neurological ROS  negative psych ROS   GI/Hepatic negative GI ROS, Neg liver ROS,   Endo/Other  diabetes, Type 2, Insulin Dependent  Renal/GU CRFRenal disease  negative genitourinary   Musculoskeletal negative musculoskeletal ROS (+)   Abdominal   Peds negative pediatric ROS (+)  Hematology  (+) anemia ,   Anesthesia Other Findings No ZOFRAN, long QT!  Reproductive/Obstetrics negative OB ROS                            Anesthesia Physical Anesthesia Plan  ASA: III  Anesthesia Plan: MAC   Post-op Pain Management:    Induction:   PONV Risk Score and Plan: 2 and Treatment may vary due to age or medical condition  Airway Management Planned: Simple Face Mask  Additional Equipment:   Intra-op Plan:   Post-operative Plan:   Informed Consent: I have reviewed the patients History and Physical, chart, labs and discussed the procedure including the risks, benefits and alternatives for the proposed anesthesia with the patient or authorized representative who has indicated his/her understanding and acceptance.   Dental advisory given  Plan Discussed with:   Anesthesia Plan Comments:         Anesthesia Quick Evaluation

## 2016-12-29 NOTE — Progress Notes (Signed)
Inpatient Diabetes Program Recommendations  AACE/ADA: New Consensus Statement on Inpatient Glycemic Control (2015)  Target Ranges:  Prepandial:   less than 140 mg/dL      Peak postprandial:   less than 180 mg/dL (1-2 hours)      Critically ill patients:  140 - 180 mg/dL   Lab Results  Component Value Date   GLUCAP 229 (H) 12/28/2016   HGBA1C 8.8 (H) 10/31/2016    Review of Glycemic ControlResults for [name], [name] (MRN [id number]) as of 12/29/2016 12:03  Ref. Range 12/28/2016 07:28 12/28/2016 11:24 12/28/2016 16:38 12/28/2016 21:32  Glucose-Capillary Latest Ref Range: 65 - 99 mg/dL 272 (H) 399 (H) 353 (H) 229 (H)  Results for [name], [name] (MRN [id number]) as of 12/29/2016 12:03  Ref. Range 12/29/2016 04:35  Glucose Latest Ref Range: 65 - 99 mg/dL 203 (H)   Diabetes history: Type 2 diabetes Outpatient Diabetes medications: Lantus 40 units QHS, Novolog 10 units tidwc Current orders for Inpatient glycemic control: Lantus 20 units q HS, Novolog moderate tid with meals  Inpatient Diabetes Program Recommendations:   Consider increasing Lantus to 25 units q HS. Also may consider adding Novolog meal coverage 3 units tid with meals (hold if patient eats less than 50%).   Thanks, Adah Perl, RN, BC-ADM Inpatient Diabetes Coordinator Pager [phone] (8a-5p)

## 2016-12-29 NOTE — Progress Notes (Signed)
Called Dr. Oneida Alar (Vascular MD on call) for clarification of Heparin drip if it needs to be on hold for now for the upcoming surgery at 7 am. Dr. Oneida Alar stated " Just wait for the OR call." Will continue to monitor pt.

## 2016-12-29 NOTE — Progress Notes (Signed)
   She has been added on for Dr. Scot Dock for av fistula vs more likely an av graft tomorrow. I have notified her of this. inr to rechecked at 6pm. NPO past midnight.   Normalee Sistare C. Donzetta Matters, MD [facility] Office: [phone] Pager: [phone]

## 2016-12-29 NOTE — Progress Notes (Addendum)
PROGRESS NOTE    [name]  IRJ:[id number] DOB: 05-08-1945 DOA: 12/23/2016 PCP: [name], MD   Brief Narrative: [name] is a 71 y.o. female with medical history significant for insulin-dependent diabetes mellitus, chronic kidney disease stage IV, chronic combined systolic/diastolic CHF, chronic atrial fibrillation, and coronary artery disease, now presenting into the emergency department at the direction of her nephrologist for evaluation of swelling and fatigue. Patient has been followed by cardiology and nephrology with worsening fluid status complicated by worsening kidney function. She has recently had her diuretics increased, but continues to be significantly up from her usual weight and her kidney function has worsened significantly over the past month or so. Patient denies any chest pain. She denies dyspnea while at rest. There has not been any fevers, chills, or significant cough. She reports fatigue, worsening progressively over the past couple months.     Assessment & Plan:   Principal Problem:   Acute on chronic combined systolic and diastolic CHF (congestive heart failure) (HCC) Active Problems:   Essential hypertension   Coronary artery disease   Elevated troponin   CKD (chronic kidney disease), stage IV (HCC)   DM type 2 causing vascular disease (HCC)   Pleural effusion on right   Anemia in chronic kidney disease   Atrial fibrillation (HCC)   Hypokalemia   Hypervolemia   Rhonchi   Acute on chronic combined systolic/diastolic CHF  Echo updated last month, EF 30-16%, grade 2 diastolic dysfunction, mild MR, severe LAE On oral lasix.  Nephrology helping with diuresis.   Acute kidney injury superimposed on CKD stage IV  Plan for graft during this admission.  On oral lasix.  Urine out put 1 L.  AV graft cancelled due to elevated INR.  Discussed with Dr Deterdine, he recommend Vitamin K now them repeat INR this afternoon and repeat dose as  needed,   A fib; on heparin  Amiodarone,  CAD,  Plavix, metoprolol/   Diabetes; type 2;  Start lantus CBG above 300    DVT prophylaxis: heparin  Code Status: full code.  Family Communication: patient  Disposition Plan: home when stable.    Consultants:   nephrology    Procedures: none   Antimicrobials: none   Subjective: Denies worsening dyspnea.     Objective: Vitals:   12/28/16 2135 12/29/16 0604 12/29/16 1031 12/29/16 1236  BP: (!) 131/51 (!) 147/64 (!) 126/51 (!) 127/59  Pulse: (!) 59 (!) 59 64 72  Resp: 16 16  18   Temp: 98.2 F (36.8 C) 98.6 F (37 C)  98 F (36.7 C)  TempSrc: Oral Oral  Oral  SpO2: 100% 99% 100% 100%  Weight:  90.1 kg (198 lb 9.6 oz)    Height:        Intake/Output Summary (Last 24 hours) at 12/29/16 1534 Last data filed at 12/29/16 1425  Gross per 24 hour  Intake              720 ml  Output             1720 ml  Net            -1000 ml   Filed Weights   12/27/16 0550 12/28/16 0410 12/29/16 0604  Weight: 93.1 kg (205 lb 4.8 oz) 92.4 kg (203 lb 11.2 oz) 90.1 kg (198 lb 9.6 oz)    Examination:  General exam: NAD Respiratory system: CTA Cardiovascular system: S 1, S 2 RRR Gastrointestinal system: BS present, soft, nt Central  nervous system: non focal.  Extremities: Symmetric 5 x 5 power. Skin: No rashes, lesions or ulcers   Data Reviewed: I have personally reviewed following labs and imaging studies  CBC:  Recent Labs Lab 12/23/16 1855 12/25/16 0654 12/26/16 0522 12/27/16 0524 12/28/16 0504 12/29/16 0435  WBC 4.1 4.0 3.9* 3.5* 4.3 4.5  NEUTROABS 3.0 3.0 2.7 2.3  --   --   HGB 8.4* 8.7* 8.6* 8.8* 9.2* 9.1*  HCT 27.9* 29.2* 28.1* 28.3* 30.8* 30.6*  MCV 94.9 95.1 94.9 95.0 96.3 97.1  PLT 226 208 221 214 236 782   Basic Metabolic Panel:  Recent Labs Lab 12/23/16 1855  12/25/16 0654 12/26/16 0522 12/27/16 0524 12/28/16 0504 12/29/16 0435  NA 136  < > 137 136 135 136 139  K 3.1*  < > 3.3* 4.3 4.2 3.9  4.0  CL 94*  < > 97* 96* 96* 98* 99*  CO2 28  < > 27 27 27 27 29   GLUCOSE 147*  < > 108* 220* 249* 267* 203*  BUN 104*  < > 96* 91* 85* 74* 71*  CREATININE 4.71*  < > 3.83* 3.68* 3.53* 3.01* 2.89*  CALCIUM 9.1  < > 8.9 9.0 9.1 9.2 9.4  PHOS 4.6  --   --   --  3.6  --   --   < > = values in this interval not displayed. GFR: Estimated Creatinine Clearance: 20.7 mL/min (A) (by C-G formula based on SCr of 2.89 mg/dL (H)). Liver Function Tests:  Recent Labs Lab 12/23/16 1855 12/27/16 0524  ALBUMIN 3.0* 2.9*   No results for input(s): LIPASE, AMYLASE in the last 168 hours. No results for input(s): AMMONIA in the last 168 hours. Coagulation Profile:  Recent Labs Lab 12/27/16 0524 12/27/16 1831 12/28/16 0504 12/29/16 0435 12/29/16 0709  INR 2.20 1.66 1.59 2.33 2.19   Cardiac Enzymes: No results for input(s): CKTOTAL, CKMB, CKMBINDEX, TROPONINI in the last 168 hours. BNP (last 3 results) No results for input(s): PROBNP in the last 8760 hours. HbA1C: No results for input(s): HGBA1C in the last 72 hours. CBG:  Recent Labs Lab 12/28/16 0728 12/28/16 1124 12/28/16 1638 12/28/16 2132 12/29/16 1235  GLUCAP 272* 399* 353* 229* 335*   Lipid Profile: No results for input(s): CHOL, HDL, LDLCALC, TRIG, CHOLHDL, LDLDIRECT in the last 72 hours. Thyroid Function Tests: No results for input(s): TSH, T4TOTAL, FREET4, T3FREE, THYROIDAB in the last 72 hours. Anemia Panel:  Recent Labs  12/27/16 1248  FERRITIN 643*  TIBC 316  IRON 208*   Sepsis Labs:  Recent Labs Lab 12/26/16 1429  PROCALCITON 0.15    Recent Results (from the past 240 hour(s))  MRSA PCR Screening     Status: None   Collection Time: 12/24/16  1:39 AM  Result Value Ref Range Status   MRSA by PCR NEGATIVE NEGATIVE Final    Comment:        The GeneXpert MRSA Assay (FDA approved for NASAL specimens only), is one component of a comprehensive MRSA colonization surveillance program. It is not intended to  diagnose MRSA infection nor to guide or monitor treatment for MRSA infections.   Surgical pcr screen     Status: None   Collection Time: 12/28/16  7:26 PM  Result Value Ref Range Status   MRSA, PCR NEGATIVE NEGATIVE Final   Staphylococcus aureus NEGATIVE NEGATIVE Final    Comment: (NOTE) The Xpert SA Assay (FDA approved for NASAL specimens in patients 89 years of age and older),  is one component of a comprehensive surveillance program. It is not intended to diagnose infection nor to guide or monitor treatment.          Radiology Studies: No results found.      Scheduled Meds: . ALPRAZolam  0.5 mg Oral QHS  . amiodarone  200 mg Oral Daily  . calcitRIOL  0.5 mcg Oral Daily  . clopidogrel  75 mg Oral Daily  . cyclobenzaprine  5 mg Oral QHS  . darbepoetin (ARANESP) injection - NON-DIALYSIS  150 mcg Subcutaneous Q Mon-1800  . fluticasone  1 spray Each Nare Daily  . furosemide  160 mg Oral TID  . hydrALAZINE  50 mg Oral BID  . insulin aspart  0-15 Units Subcutaneous TID WC  . insulin glargine  20 Units Subcutaneous QHS  . isosorbide mononitrate  30 mg Oral BID  . latanoprost  1 drop Both Eyes QHS  . loratadine  10 mg Oral Daily  . meclizine  12.5 mg Oral BID  . mouth rinse  15 mL Mouth Rinse BID  . metolazone  10 mg Oral Daily  . metoprolol succinate  25 mg Oral Q breakfast  . pantoprazole  40 mg Oral Daily  . potassium chloride  30 mEq Oral BID  . rosuvastatin  40 mg Oral q1800  . senna-docusate  2 tablet Oral BID  . sodium chloride flush  3 mL Intravenous Q12H   Continuous Infusions: . sodium chloride    . [START ON 12/30/2016]  ceFAZolin (ANCEF) IV    . heparin 900 Units/hr (12/29/16 1115)     LOS: 6 days    Time spent: 35 minutes.     Elmarie Shiley, MD [facility] Pager [phone]  If 7PM-7AM, please contact night-coverage [email or web address] Password [facility] 12/29/2016, 3:34 PM

## 2016-12-29 NOTE — Progress Notes (Signed)
S: 71 year old lady with a history of HFrEF, CKD, P Afib.Admitted for volume overload and worsening renal function.  Patient was feeling better and seen this morning. Her surgery for aVF placement was canceled today due to INR above 2.   Creatinine continued to improve with good output.  O:BP (!) 127/59 (BP Location: Left Arm)   Pulse 72   Temp 98 F (36.7 C) (Oral)   Resp 18   Ht 5\' 8"  (1.727 m)   Wt 198 lb 9.6 oz (90.1 kg)   SpO2 100%   BMI 30.20 kg/m   Intake/Output Summary (Last 24 hours) at 12/29/16 1409 Last data filed at 12/29/16 1236  Gross per 24 hour  Intake              480 ml  Output             2020 ml  Net            -1540 ml   Intake/Output: I/O last 3 completed shifts: In: 480 [P.O.:480] Out: 3301 [Urine:3300; Stool:1]  Intake/Output this shift:  Total I/O In: 240 [P.O.:240] Out: 320 [Urine:320] Weight change: -5 lb 1.6 oz (-2.313 kg)  Gen: well-developed lady, in no acute distress. CVS: Regular rate and rhythm  Resp: bilaterally decreased breath sounds from mid zone to bases with few crackles. Abd: soft, non tender, bowel sounds positive. Ext: 2+ lower extremity edema up to thighs with signs of venous stasis.   Recent Labs Lab 12/23/16 1855 12/24/16 0805 12/25/16 0654 12/26/16 0522 12/27/16 0524 12/28/16 0504 12/29/16 0435  NA 136 136 137 136 135 136 139  K 3.1* 3.4* 3.3* 4.3 4.2 3.9 4.0  CL 94* 92* 97* 96* 96* 98* 99*  CO2 28 26 27 27 27 27 29   GLUCOSE 147* 163* 108* 220* 249* 267* 203*  BUN 104* 99* 96* 91* 85* 74* 71*  CREATININE 4.71* 4.30* 3.83* 3.68* 3.53* 3.01* 2.89*  ALBUMIN 3.0*  --   --   --  2.9*  --   --   CALCIUM 9.1 9.4 8.9 9.0 9.1 9.2 9.4  PHOS 4.6  --   --   --  3.6  --   --    Liver Function Tests:  Recent Labs Lab 12/23/16 1855 12/27/16 0524  ALBUMIN 3.0* 2.9*   No results for input(s): LIPASE, AMYLASE in the last 168 hours. No results for input(s): AMMONIA in the last 168 hours. CBC:  Recent Labs Lab  12/25/16 0654 12/26/16 0522 12/27/16 0524 12/28/16 0504 12/29/16 0435  WBC 4.0 3.9* 3.5* 4.3 4.5  NEUTROABS 3.0 2.7 2.3  --   --   HGB 8.7* 8.6* 8.8* 9.2* 9.1*  HCT 29.2* 28.1* 28.3* 30.8* 30.6*  MCV 95.1 94.9 95.0 96.3 97.1  PLT 208 221 214 236 227   Cardiac Enzymes: No results for input(s): CKTOTAL, CKMB, CKMBINDEX, TROPONINI in the last 168 hours. CBG:  Recent Labs Lab 12/28/16 0728 12/28/16 1124 12/28/16 1638 12/28/16 2132 12/29/16 1235  GLUCAP 272* 399* 353* 229* 335*    Iron Studies:  Recent Labs  12/27/16 1248  IRON 208*  TIBC 316  FERRITIN 643*   Studies/Results: No results found. . ALPRAZolam  0.5 mg Oral QHS  . amiodarone  200 mg Oral Daily  . clopidogrel  75 mg Oral Daily  . cyclobenzaprine  5 mg Oral QHS  . darbepoetin (ARANESP) injection - NON-DIALYSIS  150 mcg Subcutaneous Q Mon-1800  . fluticasone  1 spray Each Nare Daily  .  furosemide  160 mg Oral TID  . hydrALAZINE  50 mg Oral BID  . insulin aspart  0-15 Units Subcutaneous TID WC  . insulin glargine  20 Units Subcutaneous QHS  . isosorbide mononitrate  30 mg Oral BID  . latanoprost  1 drop Both Eyes QHS  . loratadine  10 mg Oral Daily  . meclizine  12.5 mg Oral BID  . mouth rinse  15 mL Mouth Rinse BID  . metolazone  10 mg Oral Daily  . metoprolol succinate  25 mg Oral Q breakfast  . pantoprazole  40 mg Oral Daily  . potassium chloride  30 mEq Oral BID  . rosuvastatin  40 mg Oral q1800  . senna-docusate  2 tablet Oral BID  . sodium chloride flush  3 mL Intravenous Q12H    BMET    Component Value Date/Time   NA 139 12/29/2016 0435   NA 141 02/18/2016 0937   K 4.0 12/29/2016 0435   CL 99 (L) 12/29/2016 0435   CO2 29 12/29/2016 0435   GLUCOSE 203 (H) 12/29/2016 0435   BUN 71 (H) 12/29/2016 0435   BUN 53 (H) 02/18/2016 0937   CREATININE 2.89 (H) 12/29/2016 0435   CREATININE 1.73 (H) 10/08/2013 1024   CALCIUM 9.4 12/29/2016 0435   GFRNONAA 15 (L) 12/29/2016 0435   GFRAA 18 (L)  12/29/2016 0435   CBC    Component Value Date/Time   WBC 4.5 12/29/2016 0435   RBC 3.15 (L) 12/29/2016 0435   HGB 9.1 (L) 12/29/2016 0435   HCT 30.6 (L) 12/29/2016 0435   PLT 227 12/29/2016 0435   MCV 97.1 12/29/2016 0435   MCH 28.9 12/29/2016 0435   MCHC 29.7 (L) 12/29/2016 0435   RDW 19.5 (H) 12/29/2016 0435   LYMPHSABS 0.8 12/27/2016 0524   MONOABS 0.4 12/27/2016 0524   EOSABS 0.1 12/27/2016 0524   BASOSABS 0.0 12/27/2016 0524     Assessment/Plan:  1. AKI with CKD. Creatinine continued to improve with diuresis. Metolazone was added yesterday. -Parathyroid elevated at 270, phosphorus within normal limit. -Calcitriol 0.5 MCG daily was added. -Continue with current dose of Lasix and metolazone. -She was given a dose of vitamin K 10 mg this morning, we will recheck INR around 6 PM, if remains high another dose of 10 mg can be given. VVS Was consulted for possible procedure tomorrow morning-still waiting the response. Will get access tomorrow or Mon.  Needs to get prior to d/c considering recurrent CHF and eminent need.  HFrEF. Cardiology is following, continue gentle diuresis.   Anemia. Hemoglobin  improving, at 9.2 today, did get one dose of Feraheme . Repeat iron studies shows good iron stores. -Continue Aranesp 150 per week.  Hypertension. Blood pressure improved, normotensive today. Continue with current management.   P Afib. Rate controlled with amiodarone and beta blocker.   DM. Per primary team. Skin blisters from vol xs  I have seen and examined this patient and agree with the plan of care seen, eval, examined, counseled patient, discussed with resident and VVS .  Ausencio Vaden L 12/29/2016, 6:11 PM

## 2016-12-29 NOTE — Progress Notes (Signed)
PT Cancellation Note  Patient Details Name: [name] MRN: [id number] DOB: 14-May-1945   Cancelled Treatment:    Reason Eval/Treat Not Completed: Patient at procedure or test/unavailable   Thelma Comp 12/29/2016, 7:24 AM   Rolinda Roan, PT, DPT Acute Rehabilitation Services Pager: [phone]

## 2016-12-30 ENCOUNTER — Inpatient Hospital Stay (HOSPITAL_COMMUNITY): Payer: BLUE CROSS/BLUE SHIELD | Admitting: Certified Registered Nurse Anesthetist

## 2016-12-30 ENCOUNTER — Encounter (HOSPITAL_COMMUNITY)
Admission: EM | Disposition: A | Discharge status: Home Health | Payer: Self-pay | Source: Home / Self Care | Attending: Internal Medicine

## 2016-12-30 ENCOUNTER — Encounter (HOSPITAL_COMMUNITY): Payer: Self-pay | Admitting: Certified Registered Nurse Anesthetist

## 2016-12-30 HISTORY — PX: AV FISTULA PLACEMENT: SHX1204

## 2016-12-30 LAB — CBC
HEMATOCRIT: 28 % — AB (ref 36.0–46.0)
Hemoglobin: 8.5 g/dL — ABNORMAL LOW (ref 12.0–15.0)
MCH: 29.5 pg (ref 26.0–34.0)
MCHC: 30.4 g/dL (ref 30.0–36.0)
MCV: 97.2 fL (ref 78.0–100.0)
Platelets: 207 10*3/uL (ref 150–400)
RBC: 2.88 MIL/uL — ABNORMAL LOW (ref 3.87–5.11)
RDW: 20.1 % — AB (ref 11.5–15.5)
WBC: 4.3 10*3/uL (ref 4.0–10.5)

## 2016-12-30 LAB — BASIC METABOLIC PANEL
Anion gap: 11 (ref 5–15)
BUN: 67 mg/dL — AB (ref 6–20)
CALCIUM: 9.4 mg/dL (ref 8.9–10.3)
CO2: 30 mmol/L (ref 22–32)
Chloride: 98 mmol/L — ABNORMAL LOW (ref 101–111)
Creatinine, Ser: 2.86 mg/dL — ABNORMAL HIGH (ref 0.44–1.00)
GFR calc Af Amer: 18 mL/min — ABNORMAL LOW (ref >60)
GFR, EST NON AFRICAN AMERICAN: 15 mL/min — AB (ref >60)
GLUCOSE: 145 mg/dL — AB (ref 65–99)
POTASSIUM: 3.7 mmol/L (ref 3.5–5.1)
Sodium: 139 mmol/L (ref 135–145)

## 2016-12-30 LAB — HEPARIN LEVEL (UNFRACTIONATED)
HEPARIN UNFRACTIONATED: 0.33 [IU]/mL (ref 0.30–0.70)
Heparin Unfractionated: 0.25 IU/mL — ABNORMAL LOW (ref 0.30–0.70)

## 2016-12-30 LAB — GLUCOSE, CAPILLARY
GLUCOSE-CAPILLARY: 123 mg/dL — AB (ref 65–99)
GLUCOSE-CAPILLARY: 71 mg/dL (ref 65–99)
Glucose-Capillary: 74 mg/dL (ref 65–99)
Glucose-Capillary: 78 mg/dL (ref 65–99)
Glucose-Capillary: 205 mg/dL — ABNORMAL HIGH (ref 65–99)

## 2016-12-30 LAB — PROTIME-INR
INR: 1.75
Prothrombin Time: 20.3 seconds — ABNORMAL HIGH (ref 11.4–15.2)

## 2016-12-30 SURGERY — ARTERIOVENOUS (AV) FISTULA CREATION
Anesthesia: Monitor Anesthesia Care | Site: Arm Upper | Laterality: Left

## 2016-12-30 MED ORDER — 0.9 % SODIUM CHLORIDE (POUR BTL) OPTIME
TOPICAL | Status: DC | PRN
  Administered 2016-12-30: 1000 mL

## 2016-12-30 MED ORDER — INSULIN GLARGINE 100 UNIT/ML ~~LOC~~ SOLN
20.0000 [IU] | Freq: Every day | SUBCUTANEOUS | Status: DC
  Administered 2016-12-31 – 2017-01-06 (×7): 20 [IU] via SUBCUTANEOUS
  Filled 2016-12-30 (×8): qty 0.2

## 2016-12-30 MED ORDER — FENTANYL CITRATE (PF) 250 MCG/5ML IJ SOLN
INTRAMUSCULAR | Status: AC
  Filled 2016-12-30: qty 5

## 2016-12-30 MED ORDER — LIDOCAINE-EPINEPHRINE (PF) 1 %-1:200000 IJ SOLN
INTRAMUSCULAR | Status: DC | PRN
  Administered 2016-12-30: 12 mL

## 2016-12-30 MED ORDER — PROPOFOL 500 MG/50ML IV EMUL
INTRAVENOUS | Status: DC | PRN
  Administered 2016-12-30: 25 ug/kg/min via INTRAVENOUS

## 2016-12-30 MED ORDER — LIDOCAINE HCL (PF) 1 % IJ SOLN
INTRAMUSCULAR | Status: AC
  Filled 2016-12-30: qty 30

## 2016-12-30 MED ORDER — PROPOFOL 10 MG/ML IV BOLUS
INTRAVENOUS | Status: AC
  Filled 2016-12-30: qty 20

## 2016-12-30 MED ORDER — SODIUM CHLORIDE 0.9 % IV SOLN
INTRAVENOUS | Status: DC | PRN
  Administered 2016-12-30: 500 mL

## 2016-12-30 MED ORDER — FENTANYL CITRATE (PF) 100 MCG/2ML IJ SOLN
INTRAMUSCULAR | Status: DC | PRN
  Administered 2016-12-30 (×3): 25 ug via INTRAVENOUS

## 2016-12-30 MED ORDER — ONDANSETRON HCL 4 MG/2ML IJ SOLN
INTRAMUSCULAR | Status: DC | PRN
  Administered 2016-12-30: 4 mg via INTRAVENOUS

## 2016-12-30 MED ORDER — PHENYLEPHRINE 40 MCG/ML (10ML) SYRINGE FOR IV PUSH (FOR BLOOD PRESSURE SUPPORT)
PREFILLED_SYRINGE | INTRAVENOUS | Status: AC
  Filled 2016-12-30: qty 10

## 2016-12-30 MED ORDER — SODIUM CHLORIDE 0.9 % IV SOLN
INTRAVENOUS | Status: DC
  Administered 2016-12-30 – 2017-01-09 (×2): via INTRAVENOUS

## 2016-12-30 MED ORDER — PROTAMINE SULFATE 10 MG/ML IV SOLN
INTRAVENOUS | Status: AC
  Filled 2016-12-30: qty 5

## 2016-12-30 MED ORDER — LIDOCAINE-EPINEPHRINE (PF) 1 %-1:200000 IJ SOLN
INTRAMUSCULAR | Status: AC
  Filled 2016-12-30: qty 30

## 2016-12-30 MED ORDER — LIDOCAINE HCL (PF) 1 % IJ SOLN
INTRAMUSCULAR | Status: DC | PRN
  Administered 2016-12-30: 30 mL

## 2016-12-30 MED ORDER — LIDOCAINE 2% (20 MG/ML) 5 ML SYRINGE
INTRAMUSCULAR | Status: DC | PRN
  Administered 2016-12-30: 80 mg via INTRAVENOUS

## 2016-12-30 MED ORDER — HEPARIN SODIUM (PORCINE) 1000 UNIT/ML IJ SOLN
INTRAMUSCULAR | Status: AC
  Filled 2016-12-30: qty 1

## 2016-12-30 MED ORDER — LIDOCAINE 2% (20 MG/ML) 5 ML SYRINGE
INTRAMUSCULAR | Status: AC
  Filled 2016-12-30: qty 5

## 2016-12-30 MED ORDER — ONDANSETRON HCL 4 MG/2ML IJ SOLN
INTRAMUSCULAR | Status: AC
  Filled 2016-12-30: qty 2

## 2016-12-30 MED ORDER — PHENYLEPHRINE 40 MCG/ML (10ML) SYRINGE FOR IV PUSH (FOR BLOOD PRESSURE SUPPORT)
PREFILLED_SYRINGE | INTRAVENOUS | Status: DC | PRN
  Administered 2016-12-30: 40 ug via INTRAVENOUS

## 2016-12-30 MED ORDER — HEPARIN SODIUM (PORCINE) 1000 UNIT/ML IJ SOLN
INTRAMUSCULAR | Status: DC | PRN
  Administered 2016-12-30: 7000 [IU] via INTRAVENOUS

## 2016-12-30 MED ORDER — PROTAMINE SULFATE 10 MG/ML IV SOLN
INTRAVENOUS | Status: DC | PRN
  Administered 2016-12-30 (×4): 10 mg via INTRAVENOUS

## 2016-12-30 MED ORDER — HEPARIN (PORCINE) IN NACL 100-0.45 UNIT/ML-% IJ SOLN
850.0000 [IU]/h | INTRAMUSCULAR | Status: DC
  Administered 2016-12-31: 850 [IU]/h via INTRAVENOUS
  Filled 2016-12-30 (×2): qty 250

## 2016-12-30 SURGICAL SUPPLY — 30 items
ARMBAND PINK RESTRICT EXTREMIT (MISCELLANEOUS) ×6 IMPLANT
CANISTER SUCT 3000ML PPV (MISCELLANEOUS) ×3 IMPLANT
CANNULA VESSEL 3MM 2 BLNT TIP (CANNULA) ×3 IMPLANT
CLIP TI WIDE RED SMALL 6 (CLIP) ×3 IMPLANT
CLIP VESOCCLUDE MED 6/CT (CLIP) ×3 IMPLANT
CLIP VESOCCLUDE SM WIDE 6/CT (CLIP) ×3 IMPLANT
COVER PROBE W GEL 5X96 (DRAPES) IMPLANT
DECANTER SPIKE VIAL GLASS SM (MISCELLANEOUS) IMPLANT
DERMABOND ADVANCED (GAUZE/BANDAGES/DRESSINGS) ×2
DERMABOND ADVANCED .7 DNX12 (GAUZE/BANDAGES/DRESSINGS) ×1 IMPLANT
ELECT REM PT RETURN 9FT ADLT (ELECTROSURGICAL) ×3
ELECTRODE REM PT RTRN 9FT ADLT (ELECTROSURGICAL) ×1 IMPLANT
GLOVE BIO SURGEON STRL SZ7.5 (GLOVE) ×3 IMPLANT
GLOVE BIOGEL PI IND STRL 8 (GLOVE) ×1 IMPLANT
GLOVE BIOGEL PI INDICATOR 8 (GLOVE) ×2
GOWN STRL REUS W/ TWL LRG LVL3 (GOWN DISPOSABLE) ×3 IMPLANT
GOWN STRL REUS W/TWL LRG LVL3 (GOWN DISPOSABLE) ×6
GRAFT GORETEX STRT 4-7X45 (Vascular Products) ×3 IMPLANT
KIT BASIN OR (CUSTOM PROCEDURE TRAY) ×3 IMPLANT
KIT ROOM TURNOVER OR (KITS) ×3 IMPLANT
NS IRRIG 1000ML POUR BTL (IV SOLUTION) ×3 IMPLANT
PACK CV ACCESS (CUSTOM PROCEDURE TRAY) ×3 IMPLANT
PAD ARMBOARD 7.5X6 YLW CONV (MISCELLANEOUS) ×6 IMPLANT
SPONGE SURGIFOAM ABS GEL 100 (HEMOSTASIS) IMPLANT
SUT PROLENE 6 0 BV (SUTURE) ×15 IMPLANT
SUT VIC AB 3-0 SH 27 (SUTURE) ×4
SUT VIC AB 3-0 SH 27X BRD (SUTURE) ×2 IMPLANT
SUT VICRYL 4-0 PS2 18IN ABS (SUTURE) ×3 IMPLANT
UNDERPAD 30X30 (UNDERPADS AND DIAPERS) ×3 IMPLANT
WATER STERILE IRR 1000ML POUR (IV SOLUTION) ×3 IMPLANT

## 2016-12-30 NOTE — Transfer of Care (Signed)
Immediate Anesthesia Transfer of Care Note  Patient: [name]  Procedure(s) Performed: insertion of left upper arm gortex GRAFT (Left Arm Upper)  Patient Location: PACU  Anesthesia Type:MAC  Level of Consciousness: awake, alert  and oriented  Airway & Oxygen Therapy: Patient Spontanous Breathing and Patient connected to face mask oxygen  Post-op Assessment: Report given to RN, Post -op Vital signs reviewed and stable and Patient moving all extremities X 4  Post vital signs: Reviewed and stable  Last Vitals:  Vitals:   12/30/16 1150 12/30/16 1158  BP: 118/64 (!) 125/53  Pulse: 62 69  Resp: 18 16  Temp: 37 C 36.8 C  SpO2: 98% 97%    Last Pain:  Vitals:   12/30/16 1158  TempSrc: Oral  PainSc:       Patients Stated Pain Goal: 0 (74/25/95 6387)  Complications: No apparent anesthesia complications

## 2016-12-30 NOTE — Progress Notes (Signed)
PROGRESS NOTE    AKI BURDIN  JGG:[id number] DOB: [birth year] DOA: 12/23/2016 PCP: [name], MD   Brief Narrative: [name] is a 71 y.o. female with medical history significant for insulin-dependent diabetes mellitus, chronic kidney disease stage IV, chronic combined systolic/diastolic CHF, chronic atrial fibrillation, and coronary artery disease, now presenting into the emergency department at the direction of her nephrologist for evaluation of swelling and fatigue. Patient has been followed by cardiology and nephrology with worsening fluid status complicated by worsening kidney function. She has recently had her diuretics increased, but continues to be significantly up from her usual weight and her kidney function has worsened significantly over the past month or so. Patient denies any chest pain. She denies dyspnea while at rest. There has not been any fevers, chills, or significant cough. She reports fatigue, worsening progressively over the past couple months.     Assessment & Plan:   Principal Problem:   CKD (chronic kidney disease), stage IV (HCC) Active Problems:   Essential hypertension   Coronary artery disease   Acute on chronic combined systolic and diastolic CHF (congestive heart failure) (HCC)   Elevated troponin   DM type 2 causing vascular disease (HCC)   Pleural effusion on right   Anemia in chronic kidney disease   Atrial fibrillation (HCC)   Hypokalemia   Hypervolemia   Rhonchi   Acute on chronic combined systolic/diastolic CHF  Echo updated last month, EF 54-65%, grade 2 diastolic dysfunction, mild MR, severe LAE On oral lasix.  Nephrology helping with diuresis.  Negative for 4 L.   Acute kidney injury superimposed on CKD stage IV  Plan for graft during this admission.  On oral lasix.  Urine out put 2 L.  Underwent AV graft placement.   A fib; on heparin  Amiodarone,  CAD,  Plavix, metoprolol/   Diabetes; type 2;  Lantus. cbg  in the 70, skip night dose lantus.    DVT prophylaxis: heparin  Code Status: full code.  Family Communication: patient  Disposition Plan: home when stable.    Consultants:   nephrology    Procedures: none   Antimicrobials: none   Subjective: Denies worsening dyspnea or chest pain.     Objective: Vitals:   12/30/16 1158 12/30/16 1500 12/30/16 1515 12/30/16 1539  BP: (!) 125/53 (!) 97/48 (!) 92/45 99/74  Pulse: 69 69 61 (!) 59  Resp: 16 19 13    Temp: 98.3 F (36.8 C) 98.2 F (36.8 C) 98.2 F (36.8 C) 98.2 F (36.8 C)  TempSrc: Oral  Oral Oral  SpO2: 97% 95% 92% 96%  Weight:      Height:        Intake/Output Summary (Last 24 hours) at 12/30/16 1646 Last data filed at 12/30/16 1445  Gross per 24 hour  Intake          1043.05 ml  Output             1881 ml  Net          -837.95 ml   Filed Weights   12/28/16 0410 12/29/16 0604 12/30/16 0545  Weight: 92.4 kg (203 lb 11.2 oz) 90.1 kg (198 lb 9.6 oz) 89 kg (196 lb 3.4 oz)    Examination:  General exam; NAD Respiratory system: CTA Cardiovascular system: S 1, S 2 RRR Gastrointestinal system: BS present, soft, nt Central nervous system: non focal.  Extremities: symmetric power.  Skin: No rashes, lesions or ulcers   Data Reviewed: I have  personally reviewed following labs and imaging studies  CBC:  Recent Labs Lab 12/23/16 1855 12/25/16 0654 12/26/16 0522 12/27/16 0524 12/28/16 0504 12/29/16 0435 12/30/16 0530  WBC 4.1 4.0 3.9* 3.5* 4.3 4.5 4.3  NEUTROABS 3.0 3.0 2.7 2.3  --   --   --   HGB 8.4* 8.7* 8.6* 8.8* 9.2* 9.1* 8.5*  HCT 27.9* 29.2* 28.1* 28.3* 30.8* 30.6* 28.0*  MCV 94.9 95.1 94.9 95.0 96.3 97.1 97.2  PLT 226 208 221 214 236 227 283   Basic Metabolic Panel:  Recent Labs Lab 12/23/16 1855  12/26/16 0522 12/27/16 0524 12/28/16 0504 12/29/16 0435 12/30/16 0530  NA 136  < > 136 135 136 139 139  K 3.1*  < > 4.3 4.2 3.9 4.0 3.7  CL 94*  < > 96* 96* 98* 99* 98*  CO2 28  < > 27 27  27 29 30   GLUCOSE 147*  < > 220* 249* 267* 203* 145*  BUN 104*  < > 91* 85* 74* 71* 67*  CREATININE 4.71*  < > 3.68* 3.53* 3.01* 2.89* 2.86*  CALCIUM 9.1  < > 9.0 9.1 9.2 9.4 9.4  PHOS 4.6  --   --  3.6  --   --   --   < > = values in this interval not displayed. GFR: Estimated Creatinine Clearance: 20.7 mL/min (A) (by C-G formula based on SCr of 2.86 mg/dL (H)). Liver Function Tests:  Recent Labs Lab 12/23/16 1855 12/27/16 0524  ALBUMIN 3.0* 2.9*   No results for input(s): LIPASE, AMYLASE in the last 168 hours. No results for input(s): AMMONIA in the last 168 hours. Coagulation Profile:  Recent Labs Lab 12/27/16 1831 12/28/16 0504 12/29/16 0435 12/29/16 0709 12/30/16 0530  INR 1.66 1.59 2.33 2.19 1.75   Cardiac Enzymes: No results for input(s): CKTOTAL, CKMB, CKMBINDEX, TROPONINI in the last 168 hours. BNP (last 3 results) No results for input(s): PROBNP in the last 8760 hours. HbA1C: No results for input(s): HGBA1C in the last 72 hours. CBG:  Recent Labs Lab 12/29/16 2144 12/30/16 0759 12/30/16 1201 12/30/16 1505 12/30/16 1617  GLUCAP 111* 123* 74 78 71   Lipid Profile: No results for input(s): CHOL, HDL, LDLCALC, TRIG, CHOLHDL, LDLDIRECT in the last 72 hours. Thyroid Function Tests: No results for input(s): TSH, T4TOTAL, FREET4, T3FREE, THYROIDAB in the last 72 hours. Anemia Panel: No results for input(s): VITAMINB12, FOLATE, FERRITIN, TIBC, IRON, RETICCTPCT in the last 72 hours. Sepsis Labs:  Recent Labs Lab 12/26/16 1429  PROCALCITON 0.15    Recent Results (from the past 240 hour(s))  MRSA PCR Screening     Status: None   Collection Time: 12/24/16  1:39 AM  Result Value Ref Range Status   MRSA by PCR NEGATIVE NEGATIVE Final    Comment:        The GeneXpert MRSA Assay (FDA approved for NASAL specimens only), is one component of a comprehensive MRSA colonization surveillance program. It is not intended to diagnose MRSA infection nor to guide  or monitor treatment for MRSA infections.   Surgical pcr screen     Status: None   Collection Time: 12/28/16  7:26 PM  Result Value Ref Range Status   MRSA, PCR NEGATIVE NEGATIVE Final   Staphylococcus aureus NEGATIVE NEGATIVE Final    Comment: (NOTE) The Xpert SA Assay (FDA approved for NASAL specimens in patients 90 years of age and older), is one component of a comprehensive surveillance program. It is not intended to diagnose infection  nor to guide or monitor treatment.          Radiology Studies: No results found.      Scheduled Meds: . ALPRAZolam  0.5 mg Oral QHS  . amiodarone  200 mg Oral Daily  . calcitRIOL  0.5 mcg Oral Daily  . clopidogrel  75 mg Oral Daily  . cyclobenzaprine  5 mg Oral QHS  . darbepoetin (ARANESP) injection - NON-DIALYSIS  150 mcg Subcutaneous Q Mon-1800  . fluticasone  1 spray Each Nare Daily  . furosemide  160 mg Oral TID  . hydrALAZINE  50 mg Oral BID  . insulin aspart  0-15 Units Subcutaneous TID WC  . insulin glargine  20 Units Subcutaneous QHS  . isosorbide mononitrate  30 mg Oral BID  . latanoprost  1 drop Both Eyes QHS  . loratadine  10 mg Oral Daily  . meclizine  12.5 mg Oral BID  . mouth rinse  15 mL Mouth Rinse BID  . metolazone  10 mg Oral Daily  . metoprolol succinate  25 mg Oral Q breakfast  . pantoprazole  40 mg Oral Daily  . potassium chloride  30 mEq Oral BID  . rosuvastatin  40 mg Oral q1800  . senna-docusate  2 tablet Oral BID  . sodium chloride flush  3 mL Intravenous Q12H   Continuous Infusions: . sodium chloride    . sodium chloride 10 mL/hr at 12/30/16 1254  . heparin       LOS: 7 days    Time spent: 35 minutes.     Elmarie Shiley, MD [facility] Pager [phone]  If 7PM-7AM, please contact night-coverage [email or web address] Password [facility] 12/30/2016, 4:46 PM

## 2016-12-30 NOTE — Progress Notes (Signed)
Received verbal order from Dr. Scot Dock to stop heparin.

## 2016-12-30 NOTE — Clinical Social Work Note (Signed)
Patient has insurance authorization approval for discharge to [facility] tomorrow if stable. Discussed with MD. Patient will have to be cleared by Cardiology and Nephrology before discharge.  [name], [place]

## 2016-12-30 NOTE — Progress Notes (Signed)
ANTICOAGULATION CONSULT NOTE - Follow Up Consult  Pharmacy Consult for Heparin (warfarin on hold) Indication: atrial fibrillation  Allergies  Allergen Reactions  . Ace Inhibitors Cough  . Amlodipine Swelling    UNSPECIFIED EDEMA   . Codeine Rash    Patient Measurements: Height: 5\' 8"  (172.7 cm) Weight: 196 lb 3.4 oz (89 kg) IBW/kg (Calculated) : 63.9  Vital Signs: Temp: 98.6 F (37 C) (10/26 0545) Temp Source: Oral (10/26 0545) BP: 123/56 (10/26 0545) Pulse Rate: 65 (10/26 0545)  Labs:  Recent Labs  12/28/16 0504 12/28/16 2000 12/29/16 0435 12/29/16 0709 12/30/16 0530  HGB 9.2*  --  9.1*  --  8.5*  HCT 30.8*  --  30.6*  --  28.0*  PLT 236  --  227  --  207  LABPROT 18.8*  --  25.4* 24.2* 20.3*  INR 1.59  --  2.33 2.19 1.75  HEPARINUNFRC  --  0.98* 0.95*  --  0.33  CREATININE 3.01*  --  2.89*  --  2.86*    Estimated Creatinine Clearance: 20.7 mL/min (A) (by C-G formula based on SCr of 2.86 mg/dL (H)). Assessment: On heparin while warfarin on hold in anticipation of procedure today, heparin level is therapeutic this AM, INR 1.75 this AM  Goal of Therapy:  Heparin level 0.3-0.7 units/ml Monitor platelets by anticoagulation protocol: Yes   Plan:  -Cont heparin at 900 units/hr -1200 HL  Lacy Sofia 12/30/2016,6:26 AM

## 2016-12-30 NOTE — Anesthesia Preprocedure Evaluation (Signed)
Anesthesia Evaluation  Patient identified by MRN, date of birth, ID band Patient awake    Reviewed: Allergy & Precautions, NPO status , Patient's Chart, lab work & pertinent test results  Airway Mallampati: II  TM Distance: >3 FB Neck ROM: Full    Dental no notable dental hx. (+) Edentulous Upper, Edentulous Lower   Pulmonary neg pulmonary ROS,    Pulmonary exam normal breath sounds clear to auscultation       Cardiovascular hypertension, Pt. on medications + CAD, + Past MI and + Cardiac Stents (2017)  Normal cardiovascular exam Rhythm:Regular Rate:Normal  Long QT   Neuro/Psych negative neurological ROS  negative psych ROS   GI/Hepatic negative GI ROS, Neg liver ROS,   Endo/Other  diabetes, Type 2, Insulin Dependent  Renal/GU CRFRenal disease  negative genitourinary   Musculoskeletal negative musculoskeletal ROS (+)   Abdominal   Peds negative pediatric ROS (+)  Hematology  (+) anemia ,   Anesthesia Other Findings No ZOFRAN, long QT!  Reproductive/Obstetrics negative OB ROS                            Anesthesia Physical Anesthesia Plan  ASA: III  Anesthesia Plan: MAC   Post-op Pain Management:    Induction:   PONV Risk Score and Plan: 2 and Treatment may vary due to age or medical condition  Airway Management Planned: Simple Face Mask  Additional Equipment:   Intra-op Plan:   Post-operative Plan:   Informed Consent: I have reviewed the patients History and Physical, chart, labs and discussed the procedure including the risks, benefits and alternatives for the proposed anesthesia with the patient or authorized representative who has indicated his/her understanding and acceptance.   Dental advisory given  Plan Discussed with:   Anesthesia Plan Comments:         Anesthesia Quick Evaluation  

## 2016-12-30 NOTE — Anesthesia Postprocedure Evaluation (Signed)
Anesthesia Post Note  Patient: [name]  Procedure(s) Performed: insertion of left upper arm gortex GRAFT (Left Arm Upper)     Patient location during evaluation: PACU Anesthesia Type: MAC Level of consciousness: awake and sedated Pain management: pain level controlled Vital Signs Assessment: post-procedure vital signs reviewed and stable Respiratory status: spontaneous breathing, nonlabored ventilation, respiratory function stable and patient connected to nasal cannula oxygen Cardiovascular status: stable and blood pressure returned to baseline Postop Assessment: no apparent nausea or vomiting Anesthetic complications: no    Last Vitals:  Vitals:   12/30/16 1515 12/30/16 1539  BP: (!) 92/45 99/74  Pulse: 61 (!) 59  Resp: 13   Temp: 36.8 C 36.8 C  SpO2: 92% 96%    Last Pain:  Vitals:   12/30/16 1539  TempSrc: Oral  PainSc:                  Harshil Cavallaro,JAMES TERRILL

## 2016-12-30 NOTE — Progress Notes (Addendum)
ANTICOAGULATION CONSULT NOTE - Follow Up Consult  Pharmacy Consult for Heparin (warfarin on hold) Indication: atrial fibrillation  Allergies  Allergen Reactions  . Ace Inhibitors Cough  . Amlodipine Swelling    UNSPECIFIED EDEMA   . Codeine Rash    Patient Measurements: Height: 5\' 8"  (172.7 cm) Weight: 196 lb 3.4 oz (89 kg) IBW/kg (Calculated) : 63.9  Vital Signs: Temp: 98.2 F (36.8 C) (10/26 1539) Temp Source: Oral (10/26 1539) BP: 99/74 (10/26 1539) Pulse Rate: 59 (10/26 1539)  Labs:  Recent Labs  12/28/16 0504  12/29/16 0435 12/29/16 0709 12/30/16 0530 12/30/16 1136  HGB 9.2*  --  9.1*  --  8.5*  --   HCT 30.8*  --  30.6*  --  28.0*  --   PLT 236  --  227  --  207  --   LABPROT 18.8*  --  25.4* 24.2* 20.3*  --   INR 1.59  --  2.33 2.19 1.75  --   HEPARINUNFRC  --   < > 0.95*  --  0.33 0.25*  CREATININE 3.01*  --  2.89*  --  2.86*  --   < > = values in this interval not displayed.  Estimated Creatinine Clearance: 20.7 mL/min (A) (by C-G formula based on SCr of 2.86 mg/dL (H)). Assessment: On heparin while warfarin on hold in anticipation of procedure for HD cath placement today 12/30/16. INR 1.75 this AM HL 0.33 on 900 units/hr, therapeutic x1 then 2nd HL = 0.25 on Heparin 900 units/hr prior to going to OR. Heparin drip stopped ~ 12:00 and OR notes that  heparin was partially reversed with protamine.  H/H remains low/ hgb decreased to 8.5,  PLTC  wnl. No bleeding noted   Now s/p left upper arm AV graft placement, pharmacy consulted to restart IV heparin at 23:00 tonight, anticoagulation for h/o afib.  F/u when to restart warfarin.   Goal of Therapy:  Heparin level 0.3-0.7 units/ml Monitor platelets by anticoagulation protocol: Yes   Plan:  At 23:00 tonight restart IV Heparin drip at 1000 units/hr Check ~ 6 hour heparin level, CBC with AM labs at 5am tomorrow.  Daily heparin level, CBD F/u when to restart warfarin.    Nicole Cella, RPh Clinical  Pharmacist 8a-330p [phone] 330p-1030p phone [phone] or [phone] Main pharmacy [phone] 12/30/2016,3:52 PM

## 2016-12-30 NOTE — Interval H&P Note (Signed)
History and Physical Interval Note:  12/30/2016 12:17 PM  [name]  has presented today for surgery, with the diagnosis of END STAGE RENAL DISEASE  N18.5  The various methods of treatment have been discussed with the patient and family. After consideration of risks, benefits and other options for treatment, the patient has consented to  Procedure(s): ARTERIOVENOUS (AV) FISTULA CREATION VERSUS GRAFT LEFT ARM (Left) as a surgical intervention .  The patient's history has been reviewed, patient examined, no change in status, stable for surgery.  I have reviewed the patient's chart and labs.  Questions were answered to the patient's satisfaction.     Deitra Mayo

## 2016-12-30 NOTE — Progress Notes (Signed)
OT Cancellation Note  Patient Details Name: KALANIE FEWELL MRN: [id number] DOB: 03/05/1946   Cancelled Treatment:    Reason Eval/Treat Not Completed: Patient at procedure or test/ unavailable.  Will reattempt.  Center, OTR/L [phone]   Lucille Passy M 12/30/2016, 1:13 PM

## 2016-12-30 NOTE — H&P (View-Only) (Signed)
   She has been added on for Dr. Scot Dock for av fistula vs more likely an av graft tomorrow. I have notified her of this. inr to rechecked at 6pm. NPO past midnight.   Steed Kanaan C. Donzetta Matters, MD [facility] Office: [phone] Pager: [phone]

## 2016-12-30 NOTE — Progress Notes (Signed)
S: 71 year old lady with a history of HFrEF, CKD, P Afib.Admitted for volume overload and worsening renal function.  Patient was feeling better when seen this morning, going for her aVF placement today. INR 1.7  Creatinine continued to improve with good output.  O:BP 99/74 (BP Location: Right Arm)   Pulse (!) 59   Temp 98.2 F (36.8 C) (Oral)   Resp 13   Ht 5\' 8"  (1.727 m)   Wt 196 lb 3.4 oz (89 kg)   SpO2 96%   BMI 29.83 kg/m   Intake/Output Summary (Last 24 hours) at 12/30/16 1724 Last data filed at 12/30/16 1445  Gross per 24 hour  Intake          1043.05 ml  Output             1581 ml  Net          -537.95 ml   Intake/Output: I/O last 3 completed shifts: In: 1248.8 [P.O.:1080; I.V.:168.8] Out: 1962 [Urine:3720; Stool:1]  Intake/Output this shift:  Total I/O In: 514.3 [P.O.:60; I.V.:454.3] Out: 430 [Urine:400; Blood:30] Weight change: -2 lb 6.3 oz (-1.084 kg)  Gen: well-developed lady, in no acute distress. CVS: Regular rate and rhythm  Gr 2/6 M Resp: bilaterally decreased breath sounds from mid zone to bases with few crackles. Abd: soft, non tender, bowel sounds positive. Ext: 2+ lower extremity edema up to thighs with signs of blistering  Recent Labs Lab 12/23/16 1855 12/24/16 0805 12/25/16 0654 12/26/16 0522 12/27/16 0524 12/28/16 0504 12/29/16 0435 12/30/16 0530  NA 136 136 137 136 135 136 139 139  K 3.1* 3.4* 3.3* 4.3 4.2 3.9 4.0 3.7  CL 94* 92* 97* 96* 96* 98* 99* 98*  CO2 28 26 27 27 27 27 29 30   GLUCOSE 147* 163* 108* 220* 249* 267* 203* 145*  BUN 104* 99* 96* 91* 85* 74* 71* 67*  CREATININE 4.71* 4.30* 3.83* 3.68* 3.53* 3.01* 2.89* 2.86*  ALBUMIN 3.0*  --   --   --  2.9*  --   --   --   CALCIUM 9.1 9.4 8.9 9.0 9.1 9.2 9.4 9.4  PHOS 4.6  --   --   --  3.6  --   --   --    Liver Function Tests:  Recent Labs Lab 12/23/16 1855 12/27/16 0524  ALBUMIN 3.0* 2.9*   No results for input(s): LIPASE, AMYLASE in the last 168 hours. No results  for input(s): AMMONIA in the last 168 hours. CBC:  Recent Labs Lab 12/25/16 0654 12/26/16 0522 12/27/16 0524 12/28/16 0504 12/29/16 0435 12/30/16 0530  WBC 4.0 3.9* 3.5* 4.3 4.5 4.3  NEUTROABS 3.0 2.7 2.3  --   --   --   HGB 8.7* 8.6* 8.8* 9.2* 9.1* 8.5*  HCT 29.2* 28.1* 28.3* 30.8* 30.6* 28.0*  MCV 95.1 94.9 95.0 96.3 97.1 97.2  PLT 208 221 214 236 227 207   Cardiac Enzymes: No results for input(s): CKTOTAL, CKMB, CKMBINDEX, TROPONINI in the last 168 hours. CBG:  Recent Labs Lab 12/29/16 2144 12/30/16 0759 12/30/16 1201 12/30/16 1505 12/30/16 1617  GLUCAP 111* 123* 74 78 71    Iron Studies: No results for input(s): IRON, TIBC, TRANSFERRIN, FERRITIN in the last 72 hours. Studies/Results: No results found. . ALPRAZolam  0.5 mg Oral QHS  . amiodarone  200 mg Oral Daily  . calcitRIOL  0.5 mcg Oral Daily  . clopidogrel  75 mg Oral Daily  . cyclobenzaprine  5 mg Oral QHS  .  darbepoetin (ARANESP) injection - NON-DIALYSIS  150 mcg Subcutaneous Q Mon-1800  . fluticasone  1 spray Each Nare Daily  . furosemide  160 mg Oral TID  . hydrALAZINE  50 mg Oral BID  . insulin aspart  0-15 Units Subcutaneous TID WC  . [START ON 12/31/2016] insulin glargine  20 Units Subcutaneous QHS  . isosorbide mononitrate  30 mg Oral BID  . latanoprost  1 drop Both Eyes QHS  . loratadine  10 mg Oral Daily  . meclizine  12.5 mg Oral BID  . mouth rinse  15 mL Mouth Rinse BID  . metolazone  10 mg Oral Daily  . metoprolol succinate  25 mg Oral Q breakfast  . pantoprazole  40 mg Oral Daily  . potassium chloride  30 mEq Oral BID  . rosuvastatin  40 mg Oral q1800  . senna-docusate  2 tablet Oral BID  . sodium chloride flush  3 mL Intravenous Q12H    BMET    Component Value Date/Time   NA 139 12/30/2016 0530   NA 141 02/18/2016 0937   K 3.7 12/30/2016 0530   CL 98 (L) 12/30/2016 0530   CO2 30 12/30/2016 0530   GLUCOSE 145 (H) 12/30/2016 0530   BUN 67 (H) 12/30/2016 0530   BUN 53 (H)  02/18/2016 0937   CREATININE 2.86 (H) 12/30/2016 0530   CREATININE 1.73 (H) 10/08/2013 1024   CALCIUM 9.4 12/30/2016 0530   GFRNONAA 15 (L) 12/30/2016 0530   GFRAA 18 (L) 12/30/2016 0530   CBC    Component Value Date/Time   WBC 4.3 12/30/2016 0530   RBC 2.88 (L) 12/30/2016 0530   HGB 8.5 (L) 12/30/2016 0530   HCT 28.0 (L) 12/30/2016 0530   PLT 207 12/30/2016 0530   MCV 97.2 12/30/2016 0530   MCH 29.5 12/30/2016 0530   MCHC 30.4 12/30/2016 0530   RDW 20.1 (H) 12/30/2016 0530   LYMPHSABS 0.8 12/27/2016 0524   MONOABS 0.4 12/27/2016 0524   EOSABS 0.1 12/27/2016 0524   BASOSABS 0.0 12/27/2016 0524     Assessment/Plan: 1. AKI with CKD. creatinine continued to improve with Lasix and metolazone. -Going for aVF today. -Continue current management. Improving and diuresing. Once avg in , can d/c  HFrEF. Cardiology is following, continue gentle diuresis.   Anemia. Hemoglobin  at 8.5 today, did get onedose of Feraheme . Repeat iron studies shows good iron stores. -Continue Aranesp 150 per week.  Hypertension. Blood pressure improved, normotensive today. Continue with current management.   P Afib. Rate controlled with amiodarone and beta blocker.   DM. Per primary team. I have seen and examined this patient and agree with the plan of care seen, eval, examined ,discussed with patient and resident .  Shaquayla Klimas L 12/31/2016, 4:43 PM

## 2016-12-30 NOTE — Op Note (Signed)
    NAME: [name]    MRN: [id number] DOB: Oct 02, 1945    DATE OF OPERATION: 12/30/2016  PREOP DIAGNOSIS:    End-stage renal disease  POSTOP DIAGNOSIS:    Same  PROCEDURE:    Left upper arm AV graft  SURGEON: Judeth Cornfield. Scot Dock, MD, FACS  ASSIST: Linus Orn, PA  ANESTHESIA: Local with sedation  EBL: Minimal  INDICATIONS:    [name] is a 71 y.o. female who presents for new access.  FINDINGS:   3.5 mm brachial artery.  4.53mm brachial vein  TECHNIQUE:   The patient was taken to the operating room and sedated by anesthesia.  The left upper extremity was prepped and draped in the usual sterile fashion.  After the skin was anesthetized with 1% lidocaine, an incision was made over the brachial artery just above the antecubital level.  Here the adjacent brachial veins were very small.  Patient was not a candidate for a fistula based on the preoperative vein mapping and also based on my assessment with the SonoSite.  Given the small size of the brachial veins I elected to make an upper arm incision to explore the high brachial vein below the axilla.  Here the high brachial vein was dissected free and was a 4.5 mm vein.  A 4-7 mm PTFE graft was tunneled between the 2 incisions and the patient was then heparinized.  The brachial artery was clamped proximally and distally and a longitudinal arteriotomy was made.  A segment of the 4 mm of the graft was excised, the graft spatulated and sewn end-to-side to the brachial artery using continuous 6-0 Prolene suture.  Graft was then for the appropriate length for anastomosis to the high brachial vein.  The vein was ligated distally and spatulated proximally.  The graft was cut to appropriate length, spatulated and sewn end to end to the vein using continuous 6-0 Prolene suture.  At the completion was an excellent thrill in the graft and a radial and ulnar signal with the Doppler.  The heparin was partially reversed with  protamine.  Each of the wounds was closed with a deep layer of 3-0 Vicryl and the skin closed with 4-0 Vicryl.  Dermabond was applied.  The patient tolerated the procedure well and was transferred to the recovery room in stable condition.  All needle and sponge counts were correct.  Deitra Mayo, MD, FACS [facility]  DATE OF DICTATION:   12/30/2016

## 2016-12-30 NOTE — Progress Notes (Signed)
Progress Note  Patient Name: [name] Date of Encounter: 12/30/2016  Primary Cardiologist: Dr. Bronson Ing   Subjective   Feels well this morning. No new complaints. No recurrent panic attacks in past 24 hrs. No chest pain or resting dyspnea.   Inpatient Medications    Scheduled Meds: . ALPRAZolam  0.5 mg Oral QHS  . amiodarone  200 mg Oral Daily  . calcitRIOL  0.5 mcg Oral Daily  . clopidogrel  75 mg Oral Daily  . cyclobenzaprine  5 mg Oral QHS  . darbepoetin (ARANESP) injection - NON-DIALYSIS  150 mcg Subcutaneous Q Mon-1800  . fluticasone  1 spray Each Nare Daily  . furosemide  160 mg Oral TID  . hydrALAZINE  50 mg Oral BID  . insulin aspart  0-15 Units Subcutaneous TID WC  . insulin glargine  20 Units Subcutaneous QHS  . isosorbide mononitrate  30 mg Oral BID  . latanoprost  1 drop Both Eyes QHS  . loratadine  10 mg Oral Daily  . meclizine  12.5 mg Oral BID  . mouth rinse  15 mL Mouth Rinse BID  . metolazone  10 mg Oral Daily  . metoprolol succinate  25 mg Oral Q breakfast  . pantoprazole  40 mg Oral Daily  . potassium chloride  30 mEq Oral BID  . rosuvastatin  40 mg Oral q1800  . senna-docusate  2 tablet Oral BID  . sodium chloride flush  3 mL Intravenous Q12H   Continuous Infusions: . sodium chloride    .  ceFAZolin (ANCEF) IV    . heparin 900 Units/hr (12/29/16 1115)   PRN Meds: sodium chloride, acetaminophen, albuterol, diazepam, ondansetron (ZOFRAN) IV, sodium chloride flush   Vital Signs    Vitals:   12/29/16 2020 12/30/16 0000 12/30/16 0019 12/30/16 0545  BP: 130/60   (!) 123/56  Pulse: 64 66 67 65  Resp: 18 20 18 18   Temp: 98.5 F (36.9 C)   98.6 F (37 C)  TempSrc: Oral   Oral  SpO2: 98% 98% 98% 100%  Weight:    196 lb 3.4 oz (89 kg)  Height:        Intake/Output Summary (Last 24 hours) at 12/30/16 0829 Last data filed at 12/30/16 0600  Gross per 24 hour  Intake          1008.75 ml  Output             2321 ml  Net          -1312.25 ml   Filed Weights   12/28/16 0410 12/29/16 0604 12/30/16 0545  Weight: 203 lb 11.2 oz (92.4 kg) 198 lb 9.6 oz (90.1 kg) 196 lb 3.4 oz (89 kg)    Telemetry    NSR - Personally Reviewed  ECG    NSR - Personally Reviewed  Physical Exam   GEN: No acute distress.   Neck: mild JVD Cardiac: RRR, no murmurs, rubs, or gallops.  Respiratory: Decreased BS at the base. GI: Soft, nontender, non-distended  MS: 2+ bilateral LEE; No deformity. Neuro:  Nonfocal  Psych: Normal affect   Labs    Chemistry Recent Labs Lab 12/23/16 1855  12/27/16 0524 12/28/16 0504 12/29/16 0435 12/30/16 0530  NA 136  < > 135 136 139 139  K 3.1*  < > 4.2 3.9 4.0 3.7  CL 94*  < > 96* 98* 99* 98*  CO2 28  < > 27 27 29 30   GLUCOSE 147*  < >  249* 267* 203* 145*  BUN 104*  < > 85* 74* 71* 67*  CREATININE 4.71*  < > 3.53* 3.01* 2.89* 2.86*  CALCIUM 9.1  < > 9.1 9.2 9.4 9.4  ALBUMIN 3.0*  --  2.9*  --   --   --   GFRNONAA 8*  < > 12* 14* 15* 15*  GFRAA 10*  < > 14* 17* 18* 18*  ANIONGAP 14  < > 12 11 11 11   < > = values in this interval not displayed.   Hematology Recent Labs Lab 12/28/16 0504 12/29/16 0435 12/30/16 0530  WBC 4.3 4.5 4.3  RBC 3.20* 3.15* 2.88*  HGB 9.2* 9.1* 8.5*  HCT 30.8* 30.6* 28.0*  MCV 96.3 97.1 97.2  MCH 28.8 28.9 29.5  MCHC 29.9* 29.7* 30.4  RDW 19.2* 19.5* 20.1*  PLT 236 227 207    Cardiac EnzymesNo results for input(s): TROPONINI in the last 168 hours.  Recent Labs Lab 12/23/16 1923  TROPIPOC 0.10*     BNP Recent Labs Lab 12/23/16 1855  BNP 2,761.4*     DDimer No results for input(s): DDIMER in the last 168 hours.   Radiology    No results found.  Cardiac Studies   Most Recent 2D echo 11/11/16 Study Conclusions  - Left ventricle: The cavity size was normal. Wall thickness was increased in a pattern of moderate LVH. Systolic function was moderately to severely reduced. The estimated ejection fraction was in the range of 30% to  35%. Features are consistent with a pseudonormal left ventricular filling pattern, with concomitant abnormal relaxation and increased filling pressure (grade 2 diastolic dysfunction). Doppler parameters are consistent with high ventricular filling pressure. - Regional wall motion abnormality: Hypokinesis of the mid inferior and basal-mid inferolateral myocardium. - Aortic valve: Mildly calcified annulus. Trileaflet; mildly thickened leaflets. Valve area (VTI): 1.57 cm^2. Valve area (Vmax): 1.57 cm^2. Valve area (Vmean): 1.51 cm^2. - Mitral valve: Mildly calcified annulus. Mildly thickened leaflets . There was mild regurgitation. - Left atrium: The atrium was severely dilated. - Right atrium: The atrium was moderately dilated. - Pulmonary arteries: Systolic pressure was moderately increased. PA peak pressure: 44 mm Hg (S).  Patient Profile     71 y.o.femalePMH of CAD (s/p NSTEMI in 06/2015 with DES to PDA and RCA at that time, subtotal occlusion of LCX treated medically; repeat cath 9/2017with stable disease; inferolateral scar w/o ischemia),chronic combined systolic and diastolic CHF (EF 42-59% by echo in 11/2016), paroxysmal atrial fibrillation, right subclavian stenosis (s/p intervention), IDDM, HTN, HLD, and CKD StageIV admitted on 12/23/2016 for worsening edema and fatigue.   Assessment & Plan    1. Acute on Chronic Combined Systolic and Diastolic CHF: gradually improving. Additional 2.3L out yesterday. Net I/Os negative 4.7 L. Weight continues to trend down, at 196 today. SCr stable over the past 24 hrs at 2.86. BP and K also stable. Still volume overloaded and needs to continue IV diuretics. Nephrology following and dosing lasix. Continue with their recs. Continue to monitor I/Os, daily weights, renal function and K. Low salt diet.   2. PAF: NSR on telemetry. HR is well controlled. Continue amio and metoprolol. Coumadin on hold for anticipated AVF surgery.  INR  1.75 today.   3. CAD: h/o PCI  + stenting to RCA and PDA, known subtotal occlusion of LCx, treated medically. Recent NST 11/2016 did not show any evidence of ischemic salvageable myocardium. Stable w/o CP. Continue medical therapy.   4. Acute on Chronic Stage 4 CKD:  gradual improvement in SCr since admit date. 3.53>>3.01>>2.89>>2.86. Nephrology following. Plan is for AVF surgery for HD access, once INR is low enough.   5. DM: per IM.   6. Anemia: drop in Hgb in past 24 hrs from 9.1>>8.5. Multifactorial >>CKD and IDA. Monitor closely.   7. Anxiety: improved. No panic attacks in the last 36 hrs.   For questions or updates, please contact [place] Please consult [email or web address] for contact info under Cardiology/STEMI.      Signed, Lyda Jester, PA-C  12/30/2016, 8:29 AM    I have seen and examined the patient along with Lyda Jester, PA-C.  I have reviewed the chart, notes and new data.  I agree with PA's note.  Key new complaints: Breathing has improved (her "panic attacks" were likely PND/pulmonary edema). Excellent diuresis. Key examination changes: JVP and edema have improved, but persist Key new findings / data: Creatinine continues to slowly improve/at least remained stable despite diuresis.  Moderate anemia. INR 1.75  PLAN: Tentatively scheduled for AV fistula placement today.  I think she will still benefit from additional diuresis from a cardiovascular point of view.  I think we have yet to establish a true "dry weight".  Sanda Klein, MD, [place] [phone] 12/30/2016, 10:54 AM

## 2016-12-30 NOTE — Progress Notes (Signed)
Physical Therapy Cancellation Note   12/30/16 1345  PT Visit Information  Last PT Received On 12/30/16  Reason Eval/Treat Not Completed Patient at procedure or test/unavailable; PT will check on pt later as time allows.    Earney Navy, PTA Pager: [phone]

## 2016-12-30 NOTE — Progress Notes (Signed)
Heparin infusing at 27ml/hr.  No order to stop heparin pre-procedure today.  OR called for patient and this information given.  I called Short Stay.  Spoke with Mendel Ryder, RN and she is placing verbal order to stop Heparin.  Heparin stopped now.

## 2016-12-30 NOTE — Anesthesia Procedure Notes (Signed)
Procedure Name: MAC Date/Time: 12/30/2016 1:10 PM Performed by: Imagene Riches Pre-anesthesia Checklist: Patient identified, Emergency Drugs available, Patient being monitored and Suction available Patient Re-evaluated:Patient Re-evaluated prior to induction Oxygen Delivery Method: Simple face mask Preoxygenation: Pre-oxygenation with 100% oxygen Induction Type: IV induction Dental Injury: Teeth and Oropharynx as per pre-operative assessment

## 2016-12-31 LAB — RENAL FUNCTION PANEL
ANION GAP: 13 (ref 5–15)
Albumin: 2.9 g/dL — ABNORMAL LOW (ref 3.5–5.0)
BUN: 68 mg/dL — AB (ref 6–20)
CHLORIDE: 95 mmol/L — AB (ref 101–111)
CO2: 27 mmol/L (ref 22–32)
CREATININE: 3.5 mg/dL — AB (ref 0.44–1.00)
Calcium: 9.3 mg/dL (ref 8.9–10.3)
GFR calc non Af Amer: 12 mL/min — ABNORMAL LOW (ref >60)
GFR, EST AFRICAN AMERICAN: 14 mL/min — AB (ref >60)
Glucose, Bld: 266 mg/dL — ABNORMAL HIGH (ref 65–99)
PHOSPHORUS: 4.1 mg/dL (ref 2.5–4.6)
POTASSIUM: 5.4 mmol/L — AB (ref 3.5–5.1)
SODIUM: 135 mmol/L (ref 135–145)

## 2016-12-31 LAB — GLUCOSE, CAPILLARY
GLUCOSE-CAPILLARY: 238 mg/dL — AB (ref 65–99)
GLUCOSE-CAPILLARY: 247 mg/dL — AB (ref 65–99)
GLUCOSE-CAPILLARY: 227 mg/dL — AB (ref 65–99)
GLUCOSE-CAPILLARY: 216 mg/dL — AB (ref 65–99)
Glucose-Capillary: 182 mg/dL — ABNORMAL HIGH (ref 65–99)

## 2016-12-31 LAB — CBC
HCT: 31.9 % — ABNORMAL LOW (ref 36.0–46.0)
HEMOGLOBIN: 9.2 g/dL — AB (ref 12.0–15.0)
MCH: 28.8 pg (ref 26.0–34.0)
MCHC: 28.8 g/dL — ABNORMAL LOW (ref 30.0–36.0)
MCV: 99.7 fL (ref 78.0–100.0)
Platelets: 225 10*3/uL (ref 150–400)
RBC: 3.2 MIL/uL — AB (ref 3.87–5.11)
RDW: 20.2 % — ABNORMAL HIGH (ref 11.5–15.5)
WBC: 4.4 10*3/uL (ref 4.0–10.5)

## 2016-12-31 LAB — PROTIME-INR
INR: 1.62
PROTHROMBIN TIME: 19.1 s — AB (ref 11.4–15.2)

## 2016-12-31 LAB — HEPARIN LEVEL (UNFRACTIONATED)
HEPARIN UNFRACTIONATED: 0.97 [IU]/mL — AB (ref 0.30–0.70)
Heparin Unfractionated: 0.43 IU/mL (ref 0.30–0.70)

## 2016-12-31 MED ORDER — METOPROLOL SUCCINATE ER 25 MG PO TB24
25.0000 mg | ORAL_TABLET | Freq: Every day | ORAL | Status: DC
  Administered 2017-01-01 – 2017-01-16 (×13): 25 mg via ORAL
  Filled 2016-12-31 (×16): qty 1

## 2016-12-31 MED ORDER — WARFARIN - PHARMACIST DOSING INPATIENT
Freq: Every day | Status: DC
  Administered 2016-12-31: 18:00:00
  Administered 2017-01-01: 1
  Administered 2017-01-04 – 2017-01-08 (×3)

## 2016-12-31 MED ORDER — WARFARIN SODIUM 7.5 MG PO TABS: 7.5000 mg | ORAL_TABLET | Freq: Once | ORAL | Status: DC

## 2016-12-31 MED ORDER — WARFARIN SODIUM 2 MG PO TABS
4.0000 mg | ORAL_TABLET | Freq: Once | ORAL | Status: AC
  Administered 2016-12-31: 4 mg via ORAL
  Filled 2016-12-31: qty 2

## 2016-12-31 NOTE — Progress Notes (Addendum)
ANTICOAGULATION CONSULT NOTE - Follow Up Consult  Pharmacy Consult for Heparin / Warfarin Indication: atrial fibrillation  Allergies  Allergen Reactions  . Ace Inhibitors Cough  . Amlodipine Swelling    UNSPECIFIED EDEMA   . Codeine Rash    Patient Measurements: Height: 5\' 8"  (172.7 cm) Weight: 198 lb 9.6 oz (90.1 kg) (c scale) IBW/kg (Calculated) : 63.9  Vital Signs: Temp: 98 F (36.7 C) (10/27 0530) Temp Source: Oral (10/27 0530) BP: 103/52 (10/27 0530) Pulse Rate: 60 (10/27 0813)  Labs:  Recent Labs  12/29/16 0435 12/29/16 0709 12/30/16 0530 12/30/16 1136 12/31/16 0804  HGB 9.1*  --  8.5*  --  9.2*  HCT 30.6*  --  28.0*  --  31.9*  PLT 227  --  207  --  225  LABPROT 25.4* 24.2* 20.3*  --  19.1*  INR 2.33 2.19 1.75  --  1.62  HEPARINUNFRC 0.95*  --  0.33 0.25* 0.97*  CREATININE 2.89*  --  2.86*  --  3.50*    Estimated Creatinine Clearance: 17.1 mL/min (A) (by C-G formula based on SCr of 3.5 mg/dL (H)).   Assessment: 71 year old female continue on heparin while warfarin on hold s/p access placement Heparin level elevated this AM at 0.97 INR this AM = 1.62, resuming warfarin today  Goal of Therapy:  Heparin level 0.3-0.7 units/ml Monitor platelets by anticoagulation protocol: Yes   Plan:  Decrease heparin to 850 units / hr 8 hour heparin level  Warfarin 4 mg po x 1 tonight Daily INR  Thank you Anette Guarneri, PharmD [phone] 12/31/2016,9:48 AM

## 2016-12-31 NOTE — Progress Notes (Signed)
S:71 year old lady with a history of HFrEF, CKD, P Afib.Admitted for volume overload and worsening renal function.  Patient was feeling better, had her aVF placed yesterday. Little worsening of creatinine today,patient did not make much urine yesterday, today she is picking up with the restoration of her dose of Lasix.  O:BP (!) 102/44 (BP Location: Right Arm)   Pulse 60   Temp 98.4 F (36.9 C) (Oral)   Resp 20   Ht 5\' 8"  (1.727 m)   Wt 198 lb 9.6 oz (90.1 kg) Comment: c scale  SpO2 92%   BMI 30.20 kg/m   Intake/Output Summary (Last 24 hours) at 12/31/16 1445 Last data filed at 12/31/16 1351  Gross per 24 hour  Intake           596.17 ml  Output              700 ml  Net          -103.83 ml   Intake/Output: I/O last 3 completed shifts: In: 1219.2 [P.O.:540; I.V.:679.2] Out: 2130 [Urine:2100; Blood:30]  Intake/Output this shift:  Total I/O In: 180 [P.O.:180] Out: -  Weight change: 2 lb 6.3 oz (1.084 kg)  Gen: well-developed lady, in no acute distress. CVS: Regular rate and rhythm Gr 2/6 SEM Resp: bilaterally decreased breath sounds at bases. Abd: soft, non tender, bowel sounds positive.liver down 4 cm Ext: 2+ lower extremity edema, much improved as compared to before, left brachiocephalic fistula with good bruit. Blistering better. AVG LUA B&T   Recent Labs Lab 12/25/16 0654 12/26/16 0522 12/27/16 0524 12/28/16 0504 12/29/16 0435 12/30/16 0530 12/31/16 0804  NA 137 136 135 136 139 139 135  K 3.3* 4.3 4.2 3.9 4.0 3.7 5.4*  CL 97* 96* 96* 98* 99* 98* 95*  CO2 27 27 27 27 29 30 27   GLUCOSE 108* 220* 249* 267* 203* 145* 266*  BUN 96* 91* 85* 74* 71* 67* 68*  CREATININE 3.83* 3.68* 3.53* 3.01* 2.89* 2.86* 3.50*  ALBUMIN  --   --  2.9*  --   --   --  2.9*  CALCIUM 8.9 9.0 9.1 9.2 9.4 9.4 9.3  PHOS  --   --  3.6  --   --   --  4.1   Liver Function Tests:  Recent Labs Lab 12/27/16 0524 12/31/16 0804  ALBUMIN 2.9* 2.9*   No results for input(s): LIPASE,  AMYLASE in the last 168 hours. No results for input(s): AMMONIA in the last 168 hours. CBC:  Recent Labs Lab 12/25/16 0654 12/26/16 0522 12/27/16 0524 12/28/16 0504 12/29/16 0435 12/30/16 0530 12/31/16 0804  WBC 4.0 3.9* 3.5* 4.3 4.5 4.3 4.4  NEUTROABS 3.0 2.7 2.3  --   --   --   --   HGB 8.7* 8.6* 8.8* 9.2* 9.1* 8.5* 9.2*  HCT 29.2* 28.1* 28.3* 30.8* 30.6* 28.0* 31.9*  MCV 95.1 94.9 95.0 96.3 97.1 97.2 99.7  PLT 208 221 214 236 227 207 225   Cardiac Enzymes: No results for input(s): CKTOTAL, CKMB, CKMBINDEX, TROPONINI in the last 168 hours. CBG:  Recent Labs Lab 12/30/16 1505 12/30/16 1617 12/30/16 2113 12/31/16 0740 12/31/16 1123  GLUCAP 78 71 205* 238* 247*    Iron Studies: No results for input(s): IRON, TIBC, TRANSFERRIN, FERRITIN in the last 72 hours. Studies/Results: No results found. . ALPRAZolam  0.5 mg Oral QHS  . amiodarone  200 mg Oral Daily  . calcitRIOL  0.5 mcg Oral Daily  . clopidogrel  75 mg  Oral Daily  . cyclobenzaprine  5 mg Oral QHS  . darbepoetin (ARANESP) injection - NON-DIALYSIS  150 mcg Subcutaneous Q Mon-1800  . fluticasone  1 spray Each Nare Daily  . furosemide  160 mg Oral TID  . insulin aspart  0-15 Units Subcutaneous TID WC  . insulin glargine  20 Units Subcutaneous QHS  . isosorbide mononitrate  30 mg Oral BID  . latanoprost  1 drop Both Eyes QHS  . loratadine  10 mg Oral Daily  . meclizine  12.5 mg Oral BID  . mouth rinse  15 mL Mouth Rinse BID  . metolazone  10 mg Oral Daily  . [START ON 01/01/2017] metoprolol succinate  25 mg Oral QHS  . pantoprazole  40 mg Oral Daily  . rosuvastatin  40 mg Oral q1800  . senna-docusate  2 tablet Oral BID  . sodium chloride flush  3 mL Intravenous Q12H  . warfarin  4 mg Oral ONCE-1800  . Warfarin - Pharmacist Dosing Inpatient   Does not apply q1800    BMET    Component Value Date/Time   NA 135 12/31/2016 0804   NA 141 02/18/2016 0937   K 5.4 (H) 12/31/2016 0804   CL 95 (L) 12/31/2016  0804   CO2 27 12/31/2016 0804   GLUCOSE 266 (H) 12/31/2016 0804   BUN 68 (H) 12/31/2016 0804   BUN 53 (H) 02/18/2016 0937   CREATININE 3.50 (H) 12/31/2016 0804   CREATININE 1.73 (H) 10/08/2013 1024   CALCIUM 9.3 12/31/2016 0804   GFRNONAA 12 (L) 12/31/2016 0804   GFRAA 14 (L) 12/31/2016 0804   CBC    Component Value Date/Time   WBC 4.4 12/31/2016 0804   RBC 3.20 (L) 12/31/2016 0804   HGB 9.2 (L) 12/31/2016 0804   HCT 31.9 (L) 12/31/2016 0804   PLT 225 12/31/2016 0804   MCV 99.7 12/31/2016 0804   MCH 28.8 12/31/2016 0804   MCHC 28.8 (L) 12/31/2016 0804   RDW 20.2 (H) 12/31/2016 0804   LYMPHSABS 0.8 12/27/2016 0524   MONOABS 0.4 12/27/2016 0524   EOSABS 0.1 12/27/2016 0524   BASOSABS 0.0 12/27/2016 0524     Assessment/Plan:  AKI with CKD.Creatinine little elevated today,weight up 2 pounds, she did not get all doses of Lasix yesterday because of the procedure. AVG placed  Yesterday with good bruit. -resumed Lasix 160 mg 3 times a day with metolazone-we anticipate improvement in her renal function with good diuresis. -Keep monitoring renal function.  Hyperkalemia. Potassium mildly elevated at 5.4 today. -discontinue potassium supplement. -Keep monitoring.  HFrEF. Cardiology is following, continue gentle diuresis.  Anemia. Hemoglobin  at 8.5 today, did get onedose of Feraheme . Repeat iron studies shows good iron stores. -Continue Aranesp 150 per week.  Hypertension.Blood pressure little soft today, -Discontinue hydralazine. -continue monitoring and avoid hypotension.  P Afib. Rate controlled with amiodarone and beta blocker.  DM controlled  I have seen and examined this patient and agree with the plan of care seen, eval, examined, counseled, discussed with resident.  .  Valli Randol L 12/31/2016, 4:19 PM

## 2016-12-31 NOTE — Progress Notes (Addendum)
[facility]  Subjective  - Doing well over all, some numbness/weakness.   Objective (!) 103/52 60 98 F (36.7 C) (Oral) 18 99%  Intake/Output Summary (Last 24 hours) at 12/31/16 0849 Last data filed at 12/31/16 0458  Gross per 24 hour  Intake           930.47 ml  Output             1130 ml  Net          -199.53 ml    Left radial pulse palpable Left UE incision healing well   Assessment/Planning: POD # 1 Left upper arm AV graft  We will schedule a follow in 2 weeks with Dr. Scot Dock to evaluate her steal symptoms.    Laurence Slate [facility] 12/31/2016 8:49 AM   Addendum  I have independently interviewed and examined the patient, and I agree with the physician assistant's findings.  Pt has no palpable L radial pulse with hand grip 4/5 (unclear if due to pain at surgical site), no loss of light touch though pt notes intermittent para/anesthesia in finger.  - likely mild steal post-op - f/u in office in two weeks to see if sx are worse - gave pt instructions to call office if sx worsen in the interval time between follow up  Adele Barthel, MD, FACS [facility] [place]: [phone] Pager: [phone]  12/31/2016, 9:09 AM   --  Laboratory Lab Results:  Recent Labs  12/30/16 0530 12/31/16 0804  WBC 4.3 4.4  HGB 8.5* 9.2*  HCT 28.0* 31.9*  PLT 207 225   BMET  Recent Labs  12/29/16 0435 12/30/16 0530  NA 139 139  K 4.0 3.7  CL 99* 98*  CO2 29 30  GLUCOSE 203* 145*  BUN 71* 67*  CREATININE 2.89* 2.86*  CALCIUM 9.4 9.4    COAG Lab Results  Component Value Date   INR 1.75 12/30/2016   INR 2.19 12/29/2016   INR 2.33 12/29/2016   No results found for: PTT

## 2016-12-31 NOTE — Progress Notes (Signed)
PROGRESS NOTE    [name]  YNW:[id number] DOB: 23-Aug-1945 DOA: 12/23/2016 PCP: [name], MD   Brief Narrative: [name] is a 71 y.o. female with medical history significant for insulin-dependent diabetes mellitus, chronic kidney disease stage IV, chronic combined systolic/diastolic CHF, chronic atrial fibrillation, and coronary artery disease, now presenting into the emergency department at the direction of her nephrologist for evaluation of swelling and fatigue. Patient has been followed by cardiology and nephrology with worsening fluid status complicated by worsening kidney function. She has recently had her diuretics increased, but continues to be significantly up from her usual weight and her kidney function has worsened significantly over the past month or so. Patient denies any chest pain. She denies dyspnea while at rest. There has not been any fevers, chills, or significant cough. She reports fatigue, worsening progressively over the past couple months.     Assessment & Plan:   Principal Problem:   CKD (chronic kidney disease), stage IV (HCC) Active Problems:   Essential hypertension   Coronary artery disease   Acute on chronic combined systolic and diastolic CHF (congestive heart failure) (HCC)   Elevated troponin   DM type 2 causing vascular disease (HCC)   Pleural effusion on right   Anemia in chronic kidney disease   Atrial fibrillation (HCC)   Hypokalemia   Hypervolemia   Rhonchi   Acute on chronic combined systolic/diastolic CHF  Echo updated last month, EF 65-78%, grade 2 diastolic dysfunction, mild MR, severe LAE On oral lasix.  Nephrology helping with diuresis.  Negative for 4 L.  Weight 211----203--198--  Acute kidney injury superimposed on CKD stage IV  Plan for graft during this admission.  On oral lasix. And metolazone.  Urine out put 2 L.  Underwent AV graft placement 10-26. Possible mild steal post op.   A fib; on heparin    Amiodarone, start coumadin.   CAD,  Plavix, metoprolol/  Discussed with cardiology. Continue with plavix.   Diabetes; type 2;  Lantus. cbg in the 70, skip night dose lantus.    DVT prophylaxis: heparin  Code Status: full code.  Family Communication: patient  Disposition Plan: home when stable.    Consultants:   nephrology    Procedures: none   Antimicrobials: none   Subjective: She is breathing better.    Objective: Vitals:   12/30/16 2148 12/31/16 0530 12/31/16 0813 12/31/16 1034  BP: 96/62 (!) 103/52  (!) 95/53  Pulse:  (!) 55 60 (!) 58  Resp:  18    Temp:  98 F (36.7 C)    TempSrc:  Oral    SpO2:  99%  95%  Weight:  90.1 kg (198 lb 9.6 oz)    Height:        Intake/Output Summary (Last 24 hours) at 12/31/16 1118 Last data filed at 12/31/16 0458  Gross per 24 hour  Intake           870.47 ml  Output             1130 ml  Net          -259.53 ml   Filed Weights   12/29/16 0604 12/30/16 0545 12/31/16 0530  Weight: 90.1 kg (198 lb 9.6 oz) 89 kg (196 lb 3.4 oz) 90.1 kg (198 lb 9.6 oz)    Examination:  General exam; NAD Respiratory system: Decreased breath sounds. Crackles bases.  Cardiovascular system: S 1, S 2 RRR Gastrointestinal system: BS present, soft, nt Central nervous  system: non focal.  Extremities: Symmetric power, plus 2 edema.  Skin: No rashes, lesions or ulcers   Data Reviewed: I have personally reviewed following labs and imaging studies  CBC:  Recent Labs Lab 12/25/16 0654 12/26/16 0522 12/27/16 0524 12/28/16 0504 12/29/16 0435 12/30/16 0530 12/31/16 0804  WBC 4.0 3.9* 3.5* 4.3 4.5 4.3 4.4  NEUTROABS 3.0 2.7 2.3  --   --   --   --   HGB 8.7* 8.6* 8.8* 9.2* 9.1* 8.5* 9.2*  HCT 29.2* 28.1* 28.3* 30.8* 30.6* 28.0* 31.9*  MCV 95.1 94.9 95.0 96.3 97.1 97.2 99.7  PLT 208 221 214 236 227 207 161   Basic Metabolic Panel:  Recent Labs Lab 12/27/16 0524 12/28/16 0504 12/29/16 0435 12/30/16 0530 12/31/16 0804  NA 135  136 139 139 135  K 4.2 3.9 4.0 3.7 5.4*  CL 96* 98* 99* 98* 95*  CO2 27 27 29 30 27   GLUCOSE 249* 267* 203* 145* 266*  BUN 85* 74* 71* 67* 68*  CREATININE 3.53* 3.01* 2.89* 2.86* 3.50*  CALCIUM 9.1 9.2 9.4 9.4 9.3  PHOS 3.6  --   --   --  4.1   GFR: Estimated Creatinine Clearance: 17.1 mL/min (A) (by C-G formula based on SCr of 3.5 mg/dL (H)). Liver Function Tests:  Recent Labs Lab 12/27/16 0524 12/31/16 0804  ALBUMIN 2.9* 2.9*   No results for input(s): LIPASE, AMYLASE in the last 168 hours. No results for input(s): AMMONIA in the last 168 hours. Coagulation Profile:  Recent Labs Lab 12/28/16 0504 12/29/16 0435 12/29/16 0709 12/30/16 0530 12/31/16 0804  INR 1.59 2.33 2.19 1.75 1.62   Cardiac Enzymes: No results for input(s): CKTOTAL, CKMB, CKMBINDEX, TROPONINI in the last 168 hours. BNP (last 3 results) No results for input(s): PROBNP in the last 8760 hours. HbA1C: No results for input(s): HGBA1C in the last 72 hours. CBG:  Recent Labs Lab 12/30/16 1201 12/30/16 1505 12/30/16 1617 12/30/16 2113 12/31/16 0740  GLUCAP 74 78 71 205* 238*   Lipid Profile: No results for input(s): CHOL, HDL, LDLCALC, TRIG, CHOLHDL, LDLDIRECT in the last 72 hours. Thyroid Function Tests: No results for input(s): TSH, T4TOTAL, FREET4, T3FREE, THYROIDAB in the last 72 hours. Anemia Panel: No results for input(s): VITAMINB12, FOLATE, FERRITIN, TIBC, IRON, RETICCTPCT in the last 72 hours. Sepsis Labs:  Recent Labs Lab 12/26/16 1429  PROCALCITON 0.15    Recent Results (from the past 240 hour(s))  MRSA PCR Screening     Status: None   Collection Time: 12/24/16  1:39 AM  Result Value Ref Range Status   MRSA by PCR NEGATIVE NEGATIVE Final    Comment:        The GeneXpert MRSA Assay (FDA approved for NASAL specimens only), is one component of a comprehensive MRSA colonization surveillance program. It is not intended to diagnose MRSA infection nor to guide or monitor  treatment for MRSA infections.   Surgical pcr screen     Status: None   Collection Time: 12/28/16  7:26 PM  Result Value Ref Range Status   MRSA, PCR NEGATIVE NEGATIVE Final   Staphylococcus aureus NEGATIVE NEGATIVE Final    Comment: (NOTE) The Xpert SA Assay (FDA approved for NASAL specimens in patients 58 years of age and older), is one component of a comprehensive surveillance program. It is not intended to diagnose infection nor to guide or monitor treatment.          Radiology Studies: No results found.  Scheduled Meds: . ALPRAZolam  0.5 mg Oral QHS  . amiodarone  200 mg Oral Daily  . calcitRIOL  0.5 mcg Oral Daily  . clopidogrel  75 mg Oral Daily  . cyclobenzaprine  5 mg Oral QHS  . darbepoetin (ARANESP) injection - NON-DIALYSIS  150 mcg Subcutaneous Q Mon-1800  . fluticasone  1 spray Each Nare Daily  . furosemide  160 mg Oral TID  . insulin aspart  0-15 Units Subcutaneous TID WC  . insulin glargine  20 Units Subcutaneous QHS  . isosorbide mononitrate  30 mg Oral BID  . latanoprost  1 drop Both Eyes QHS  . loratadine  10 mg Oral Daily  . meclizine  12.5 mg Oral BID  . mouth rinse  15 mL Mouth Rinse BID  . metolazone  10 mg Oral Daily  . [START ON 01/01/2017] metoprolol succinate  25 mg Oral QHS  . pantoprazole  40 mg Oral Daily  . rosuvastatin  40 mg Oral q1800  . senna-docusate  2 tablet Oral BID  . sodium chloride flush  3 mL Intravenous Q12H   Continuous Infusions: . sodium chloride    . sodium chloride 10 mL/hr at 12/30/16 1254  . heparin 1,000 Units/hr (12/30/16 2321)     LOS: 8 days    Time spent: 35 minutes.     Elmarie Shiley, MD [facility] Pager [phone]  If 7PM-7AM, please contact night-coverage [email or web address] Password [email or web address] 12/31/2016, 11:18 AM

## 2016-12-31 NOTE — Progress Notes (Signed)
Progress Note  Patient Name: [name] Date of Encounter: 12/31/2016  Primary Cardiologist: Jacinta Shoe  Subjective   Feeling better, less short of breath, no chest pain  Inpatient Medications    Scheduled Meds: . ALPRAZolam  0.5 mg Oral QHS  . amiodarone  200 mg Oral Daily  . calcitRIOL  0.5 mcg Oral Daily  . clopidogrel  75 mg Oral Daily  . cyclobenzaprine  5 mg Oral QHS  . darbepoetin (ARANESP) injection - NON-DIALYSIS  150 mcg Subcutaneous Q Mon-1800  . fluticasone  1 spray Each Nare Daily  . furosemide  160 mg Oral TID  . insulin aspart  0-15 Units Subcutaneous TID WC  . insulin glargine  20 Units Subcutaneous QHS  . isosorbide mononitrate  30 mg Oral BID  . latanoprost  1 drop Both Eyes QHS  . loratadine  10 mg Oral Daily  . meclizine  12.5 mg Oral BID  . mouth rinse  15 mL Mouth Rinse BID  . metolazone  10 mg Oral Daily  . [START ON 01/01/2017] metoprolol succinate  25 mg Oral QHS  . pantoprazole  40 mg Oral Daily  . rosuvastatin  40 mg Oral q1800  . senna-docusate  2 tablet Oral BID  . sodium chloride flush  3 mL Intravenous Q12H  . warfarin  4 mg Oral ONCE-1800  . Warfarin - Pharmacist Dosing Inpatient   Does not apply q1800   Continuous Infusions: . sodium chloride    . sodium chloride 10 mL/hr at 12/30/16 1254  . heparin 1,000 Units/hr (12/30/16 2321)   PRN Meds: sodium chloride, acetaminophen, albuterol, diazepam, ondansetron (ZOFRAN) IV, sodium chloride flush   Vital Signs    Vitals:   12/30/16 2148 12/31/16 0530 12/31/16 0813 12/31/16 1034  BP: 96/62 (!) 103/52  (!) 95/53  Pulse:  (!) 55 60 (!) 58  Resp:  18    Temp:  98 F (36.7 C)    TempSrc:  Oral    SpO2:  99%  95%  Weight:  198 lb 9.6 oz (90.1 kg)    Height:        Intake/Output Summary (Last 24 hours) at 12/31/16 1130 Last data filed at 12/31/16 0458  Gross per 24 hour  Intake           870.47 ml  Output             1130 ml  Net          -259.53 ml   Filed Weights   12/29/16 0604 12/30/16 0545 12/31/16 0530  Weight: 198 lb 9.6 oz (90.1 kg) 196 lb 3.4 oz (89 kg) 198 lb 9.6 oz (90.1 kg)    Telemetry    Normal sinus rhythm- Personally Reviewed  ECG    Normal sinus rhythm- Personally Reviewed  Physical Exam   GEN: No acute distress.   Neck: No JVD Cardiac: RRR, no murmurs, rubs, or gallops.  Respiratory: Clear to auscultation bilaterally, minimally decreased at bases. GI: Soft, nontender, non-distended  MS:  1 -2+ lower extremity edema; No deformity. Neuro:  Nonfocal  Psych: Normal affect   Labs    Chemistry Recent Labs Lab 12/27/16 0524  12/29/16 0435 12/30/16 0530 12/31/16 0804  NA 135  < > 139 139 135  K 4.2  < > 4.0 3.7 5.4*  CL 96*  < > 99* 98* 95*  CO2 27  < > 29 30 27   GLUCOSE 249*  < > 203* 145* 266*  BUN  85*  < > 71* 67* 68*  CREATININE 3.53*  < > 2.89* 2.86* 3.50*  CALCIUM 9.1  < > 9.4 9.4 9.3  ALBUMIN 2.9*  --   --   --  2.9*  GFRNONAA 12*  < > 15* 15* 12*  GFRAA 14*  < > 18* 18* 14*  ANIONGAP 12  < > 11 11 13   < > = values in this interval not displayed.   Hematology Recent Labs Lab 12/29/16 0435 12/30/16 0530 12/31/16 0804  WBC 4.5 4.3 4.4  RBC 3.15* 2.88* 3.20*  HGB 9.1* 8.5* 9.2*  HCT 30.6* 28.0* 31.9*  MCV 97.1 97.2 99.7  MCH 28.9 29.5 28.8  MCHC 29.7* 30.4 28.8*  RDW 19.5* 20.1* 20.2*  PLT 227 207 225    Cardiac EnzymesNo results for input(s): TROPONINI in the last 168 hours. No results for input(s): TROPIPOC in the last 168 hours.   BNPNo results for input(s): BNP, PROBNP in the last 168 hours.   DDimer No results for input(s): DDIMER in the last 168 hours.   Radiology    No results found.  Cardiac Studies   EF 30%  Patient Profile     71 y.o. female with chronic systolic heart failure EF 30% on echocardiogram 11/2016 with coronary artery disease, stage IV chronic kidney disease, right subclavian stenosis status post intervention, paroxysmal atrial fibrillation.  Assessment & Plan      Acute on chronic combined systolic and diastolic heart failure - Total out approximately 5 L, weight from 211 pounds down to 198 pounds.  Her nephrology team has been following her Lasix and metolazone, dosing per their recommendation. -Creatinine has increased to 3.5 as it was on 12/27/16.  Paroxysmal atrial fibrillation -On amiodarone, metoprolol.  Currently normal sinus rhythm -Okay to resume Coumadin.  This was held for her AV fistula surgery.  Discussed with Dr. Tyrell Antonio  Coronary artery disease -Has had stents of the RCA, PDA, known subtotal occlusion of left circumflex treated medically with nuclear stress test in September 2018 showing no evidence of ischemic salvageable myocardium.  No chest pain.  Continue with aggressive medical therapy.  Okay to continue with Plavix.  Since she is going to be on Coumadin once again, would advocate for Coumadin Plavix combination without aspirin to help reduce bleeding risks.  Discussed with Dr. Tyrell Antonio.   Chronic kidney disease stage IV -AV fistula placed    For questions or updates, please contact [place] Please consult [email or web address] for contact info under Cardiology/STEMI.      Signed, Candee Furbish, MD  12/31/2016, 11:30 AM

## 2016-12-31 NOTE — Progress Notes (Signed)
ANTICOAGULATION CONSULT NOTE - Follow Up Consult  Pharmacy Consult for Heparin / Warfarin Indication: atrial fibrillation  Allergies  Allergen Reactions  . Ace Inhibitors Cough  . Amlodipine Swelling    UNSPECIFIED EDEMA   . Codeine Rash   Patient Measurements: Height: 5\' 8"  (172.7 cm) Weight: 198 lb 9.6 oz (90.1 kg) (c scale) IBW/kg (Calculated) : 63.9  Vital Signs: Temp: 98.4 F (36.9 C) (10/27 1215) Temp Source: Oral (10/27 1215) BP: 102/44 (10/27 1215) Pulse Rate: 60 (10/27 1215)  Labs:  Recent Labs  12/29/16 0435 12/29/16 0709 12/30/16 0530 12/30/16 1136 12/31/16 0804 12/31/16 1847  HGB 9.1*  --  8.5*  --  9.2*  --   HCT 30.6*  --  28.0*  --  31.9*  --   PLT 227  --  207  --  225  --   LABPROT 25.4* 24.2* 20.3*  --  19.1*  --   INR 2.33 2.19 1.75  --  1.62  --   HEPARINUNFRC 0.95*  --  0.33 0.25* 0.97* 0.43  CREATININE 2.89*  --  2.86*  --  3.50*  --    Estimated Creatinine Clearance: 17.1 mL/min (A) (by C-G formula based on SCr of 3.5 mg/dL (H)).   Assessment: 71 year old female continue on heparin while warfarin on hold s/p access placement  Heparin level therapeutic 0.43, no bleeding noted  Goal of Therapy:  Heparin level 0.3-0.7 units/ml Monitor platelets by anticoagulation protocol: Yes   Plan:  Continue heparin gtt at 850 units/hr Daily heparin level/CBC Monitor for s/sx of bleeding  Georga Bora, PharmD Clinical Pharmacist 12/31/2016 8:05 PM

## 2017-01-01 LAB — RENAL FUNCTION PANEL
ANION GAP: 13 (ref 5–15)
Albumin: 2.5 g/dL — ABNORMAL LOW (ref 3.5–5.0)
BUN: 75 mg/dL — AB (ref 6–20)
CO2: 26 mmol/L (ref 22–32)
Calcium: 9.1 mg/dL (ref 8.9–10.3)
Chloride: 94 mmol/L — ABNORMAL LOW (ref 101–111)
Creatinine, Ser: 4.06 mg/dL — ABNORMAL HIGH (ref 0.44–1.00)
GFR calc Af Amer: 12 mL/min — ABNORMAL LOW (ref >60)
GFR calc non Af Amer: 10 mL/min — ABNORMAL LOW (ref >60)
GLUCOSE: 245 mg/dL — AB (ref 65–99)
PHOSPHORUS: 4.1 mg/dL (ref 2.5–4.6)
POTASSIUM: 4.3 mmol/L (ref 3.5–5.1)
Sodium: 133 mmol/L — ABNORMAL LOW (ref 135–145)

## 2017-01-01 LAB — GLUCOSE, CAPILLARY
GLUCOSE-CAPILLARY: 238 mg/dL — AB (ref 65–99)
GLUCOSE-CAPILLARY: 200 mg/dL — AB (ref 65–99)
GLUCOSE-CAPILLARY: 141 mg/dL — AB (ref 65–99)
GLUCOSE-CAPILLARY: 203 mg/dL — AB (ref 65–99)

## 2017-01-01 LAB — HEPARIN LEVEL (UNFRACTIONATED): Heparin Unfractionated: 0.4 IU/mL (ref 0.30–0.70)

## 2017-01-01 LAB — CBC
HEMATOCRIT: 27.6 % — AB (ref 36.0–46.0)
HEMOGLOBIN: 8.2 g/dL — AB (ref 12.0–15.0)
MCH: 29.1 pg (ref 26.0–34.0)
MCHC: 29.7 g/dL — AB (ref 30.0–36.0)
MCV: 97.9 fL (ref 78.0–100.0)
Platelets: 201 10*3/uL (ref 150–400)
RBC: 2.82 MIL/uL — ABNORMAL LOW (ref 3.87–5.11)
RDW: 20.3 % — AB (ref 11.5–15.5)
WBC: 3.9 10*3/uL — ABNORMAL LOW (ref 4.0–10.5)

## 2017-01-01 LAB — PROTIME-INR
INR: 2.01
Prothrombin Time: 22.6 seconds — ABNORMAL HIGH (ref 11.4–15.2)

## 2017-01-01 MED ORDER — WARFARIN SODIUM 2 MG PO TABS
2.0000 mg | ORAL_TABLET | Freq: Once | ORAL | Status: AC
  Administered 2017-01-01: 2 mg via ORAL
  Filled 2017-01-01: qty 1

## 2017-01-01 NOTE — Progress Notes (Signed)
ANTICOAGULATION CONSULT NOTE - Follow Up Consult  Pharmacy Consult for Warfarin Indication: atrial fibrillation  Allergies  Allergen Reactions  . Ace Inhibitors Cough  . Amlodipine Swelling    UNSPECIFIED EDEMA   . Codeine Rash   Patient Measurements: Height: 5\' 8"  (172.7 cm) Weight: 200 lb (90.7 kg) IBW/kg (Calculated) : 63.9  Vital Signs: Temp: 98.5 F (36.9 C) (10/28 0540) Temp Source: Oral (10/28 0540) BP: 117/51 (10/28 0540) Pulse Rate: 58 (10/28 0540)  Labs:  Recent Labs  12/30/16 0530  12/31/16 0804 12/31/16 1847 01/01/17 0436  HGB 8.5*  --  9.2*  --  8.2*  HCT 28.0*  --  31.9*  --  27.6*  PLT 207  --  225  --  201  LABPROT 20.3*  --  19.1*  --  22.6*  INR 1.75  --  1.62  --  2.01  HEPARINUNFRC 0.33  < > 0.97* 0.43 0.40  CREATININE 2.86*  --  3.50*  --  4.06*  < > = values in this interval not displayed.  Estimated Creatinine Clearance: 14.8 mL/min (A) (by C-G formula based on SCr of 4.06 mg/dL (H)).   Assessment: 71 year old female to continue on warfarin for atrial fibrillation. Recent aVF placement on 10/26. Heparin has been stopped since INR is now therapeutic. HgB down slightly 8.2, no bleeding documented; Pt with worsening renal function and decreased urine output.  INR up 1.6 >> 2.0 following one dose of warfarin; noted to be supratherapeutic (INR= 5.1) on 10/22 requiring vitamin K and repeat dose of vitamin K  given 10/25.   PTA warfarin regimen: 7.5mg  daily alternating with 3.75mg    Goal of Therapy:  Heparin level 0.3-0.7 units/ml Monitor platelets by anticoagulation protocol: Yes   Plan:  Warfarin 2mg  PO x1 tonight Daily INR/CBC Monitor for s/sx of bleeding  Georga Bora, PharmD Clinical Pharmacist 01/01/2017 11:15 AM

## 2017-01-01 NOTE — Progress Notes (Signed)
PROGRESS NOTE    [name]  UVO:[id number] DOB: Oct 08, 1945 DOA: 12/23/2016 PCP: [name], MD   Brief Narrative: [name] is a 71 y.o. female with medical history significant for insulin-dependent diabetes mellitus, chronic kidney disease stage IV, chronic combined systolic/diastolic CHF, chronic atrial fibrillation, and coronary artery disease, now presenting into the emergency department at the direction of her nephrologist for evaluation of swelling and fatigue. Patient has been followed by cardiology and nephrology with worsening fluid status complicated by worsening kidney function. She has recently had her diuretics increased, but continues to be significantly up from her usual weight and her kidney function has worsened significantly over the past month or so. Patient denies any chest pain. She denies dyspnea while at rest. There has not been any fevers, chills, or significant cough. She reports fatigue, worsening progressively over the past couple months.     Assessment & Plan:   Principal Problem:   CKD (chronic kidney disease), stage IV (HCC) Active Problems:   Essential hypertension   Coronary artery disease   Acute on chronic combined systolic and diastolic CHF (congestive heart failure) (HCC)   Elevated troponin   DM type 2 causing vascular disease (HCC)   Pleural effusion on right   Anemia in chronic kidney disease   Atrial fibrillation (HCC)   Hypokalemia   Hypervolemia   Rhonchi   Acute on chronic combined systolic/diastolic CHF  Echo updated last month, EF 74-25%, grade 2 diastolic dysfunction, mild MR, severe LAE On oral lasix.  Nephrology helping with diuresis.  Negative for 4 L.  Weight 211----203--198--200 Worsening cr, plan to hold metolazone.    Acute kidney injury superimposed on CKD stage IV  Plan for graft during this admission.  On oral lasix. And metolazone.  Underwent AV graft placement 10-26. Possible mild steal post op.   Drop in urine out put.  Cr worse today, plan to stop metolazone.  Continue to monitor.   A fib;   Amiodarone, Started  coumadin.  INR at 2, stop heparin.   CAD,  Plavix, metoprolol/  Discussed with cardiology. Continue with plavix.   Diabetes; type 2;  Lantus. cbg in the 70, skip night dose lantus.    DVT prophylaxis: heparin  Code Status: full code.  Family Communication: patient  Disposition Plan: home when stable.    Consultants:   nephrology    Procedures: none   Antimicrobials: none   Subjective: She is breathing better. Denies chest pain.  She has not been drinking plenty of fluids.     Objective: Vitals:   12/31/16 2020 01/01/17 0044 01/01/17 0540 01/01/17 0550  BP: (!) 98/45 (!) 108/53 (!) 117/51   Pulse: 60 (!) 55 (!) 58   Resp: 18 18 16    Temp: 98.3 F (36.8 C) 98 F (36.7 C) 98.5 F (36.9 C)   TempSrc: Oral Oral Oral   SpO2: 100% 96% 92%   Weight:    90.7 kg (200 lb)  Height:        Intake/Output Summary (Last 24 hours) at 01/01/17 0949 Last data filed at 01/01/17 0700  Gross per 24 hour  Intake           727.62 ml  Output              328 ml  Net           399.62 ml   Filed Weights   12/30/16 0545 12/31/16 0530 01/01/17 0550  Weight: 89 kg (196 lb  3.4 oz) 90.1 kg (198 lb 9.6 oz) 90.7 kg (200 lb)    Examination:  General exam; NAD Respiratory system: Normal respiratory effort.  Cardiovascular system: S 1, S 2 RRR Gastrointestinal system: BAS present, soft, nt Central nervous system: non focal.  Extremities: symmetric power, plus 1 edema  Skin: No rashes, lesions or ulcers   Data Reviewed: I have personally reviewed following labs and imaging studies  CBC:  Recent Labs Lab 12/26/16 0522 12/27/16 0524 12/28/16 0504 12/29/16 0435 12/30/16 0530 12/31/16 0804 01/01/17 0436  WBC 3.9* 3.5* 4.3 4.5 4.3 4.4 3.9*  NEUTROABS 2.7 2.3  --   --   --   --   --   HGB 8.6* 8.8* 9.2* 9.1* 8.5* 9.2* 8.2*  HCT 28.1* 28.3* 30.8*  30.6* 28.0* 31.9* 27.6*  MCV 94.9 95.0 96.3 97.1 97.2 99.7 97.9  PLT 221 214 236 227 207 225 528   Basic Metabolic Panel:  Recent Labs Lab 12/27/16 0524 12/28/16 0504 12/29/16 0435 12/30/16 0530 12/31/16 0804 01/01/17 0436  NA 135 136 139 139 135 133*  K 4.2 3.9 4.0 3.7 5.4* 4.3  CL 96* 98* 99* 98* 95* 94*  CO2 27 27 29 30 27 26   GLUCOSE 249* 267* 203* 145* 266* 245*  BUN 85* 74* 71* 67* 68* 75*  CREATININE 3.53* 3.01* 2.89* 2.86* 3.50* 4.06*  CALCIUM 9.1 9.2 9.4 9.4 9.3 9.1  PHOS 3.6  --   --   --  4.1 4.1   GFR: Estimated Creatinine Clearance: 14.8 mL/min (A) (by C-G formula based on SCr of 4.06 mg/dL (H)). Liver Function Tests:  Recent Labs Lab 12/27/16 0524 12/31/16 0804 01/01/17 0436  ALBUMIN 2.9* 2.9* 2.5*   No results for input(s): LIPASE, AMYLASE in the last 168 hours. No results for input(s): AMMONIA in the last 168 hours. Coagulation Profile:  Recent Labs Lab 12/29/16 0435 12/29/16 0709 12/30/16 0530 12/31/16 0804 01/01/17 0436  INR 2.33 2.19 1.75 1.62 2.01   Cardiac Enzymes: No results for input(s): CKTOTAL, CKMB, CKMBINDEX, TROPONINI in the last 168 hours. BNP (last 3 results) No results for input(s): PROBNP in the last 8760 hours. HbA1C: No results for input(s): HGBA1C in the last 72 hours. CBG:  Recent Labs Lab 12/31/16 1123 12/31/16 1625 12/31/16 2124 12/31/16 2246 01/01/17 0732  GLUCAP 247* 182* 227* 216* 238*   Lipid Profile: No results for input(s): CHOL, HDL, LDLCALC, TRIG, CHOLHDL, LDLDIRECT in the last 72 hours. Thyroid Function Tests: No results for input(s): TSH, T4TOTAL, FREET4, T3FREE, THYROIDAB in the last 72 hours. Anemia Panel: No results for input(s): VITAMINB12, FOLATE, FERRITIN, TIBC, IRON, RETICCTPCT in the last 72 hours. Sepsis Labs:  Recent Labs Lab 12/26/16 1429  PROCALCITON 0.15    Recent Results (from the past 240 hour(s))  MRSA PCR Screening     Status: None   Collection Time: 12/24/16  1:39 AM    Result Value Ref Range Status   MRSA by PCR NEGATIVE NEGATIVE Final    Comment:        The GeneXpert MRSA Assay (FDA approved for NASAL specimens only), is one component of a comprehensive MRSA colonization surveillance program. It is not intended to diagnose MRSA infection nor to guide or monitor treatment for MRSA infections.   Surgical pcr screen     Status: None   Collection Time: 12/28/16  7:26 PM  Result Value Ref Range Status   MRSA, PCR NEGATIVE NEGATIVE Final   Staphylococcus aureus NEGATIVE NEGATIVE Final  Comment: (NOTE) The Xpert SA Assay (FDA approved for NASAL specimens in patients 44 years of age and older), is one component of a comprehensive surveillance program. It is not intended to diagnose infection nor to guide or monitor treatment.          Radiology Studies: No results found.      Scheduled Meds: . ALPRAZolam  0.5 mg Oral QHS  . amiodarone  200 mg Oral Daily  . calcitRIOL  0.5 mcg Oral Daily  . clopidogrel  75 mg Oral Daily  . cyclobenzaprine  5 mg Oral QHS  . darbepoetin (ARANESP) injection - NON-DIALYSIS  150 mcg Subcutaneous Q Mon-1800  . fluticasone  1 spray Each Nare Daily  . furosemide  160 mg Oral TID  . insulin aspart  0-15 Units Subcutaneous TID WC  . insulin glargine  20 Units Subcutaneous QHS  . isosorbide mononitrate  30 mg Oral BID  . latanoprost  1 drop Both Eyes QHS  . loratadine  10 mg Oral Daily  . meclizine  12.5 mg Oral BID  . mouth rinse  15 mL Mouth Rinse BID  . metoprolol succinate  25 mg Oral QHS  . pantoprazole  40 mg Oral Daily  . rosuvastatin  40 mg Oral q1800  . senna-docusate  2 tablet Oral BID  . sodium chloride flush  3 mL Intravenous Q12H  . Warfarin - Pharmacist Dosing Inpatient   Does not apply q1800   Continuous Infusions: . sodium chloride    . sodium chloride 10 mL/hr at 12/30/16 1254  . heparin 850 Units/hr (12/31/16 1251)     LOS: 9 days    Time spent: 35 minutes.     Elmarie Shiley, MD [facility] Pager [phone]  If 7PM-7AM, please contact night-coverage [email or web address] Password [email or web address] 01/01/2017, 9:49 AM

## 2017-01-01 NOTE — Progress Notes (Signed)
S: 71 year old lady with a history of HFrEF, CKD, P Afib.Admitted for volume overload and worsening renal function.  Patient has no complaints today. According to her she did not make much urine yesterday but started picking up overnight. Recorded output is low and patient appeared more volume overloaded with worsening of her renal function.  O:BP (!) 117/51 (BP Location: Right Arm)   Pulse (!) 58   Temp 98.5 F (36.9 C) (Oral)   Resp 16   Ht 5\' 8"  (1.727 m)   Wt 200 lb (90.7 kg)   SpO2 92%   BMI 30.41 kg/m   Intake/Output Summary (Last 24 hours) at 01/01/17 1611 Last data filed at 01/01/17 0700  Gross per 24 hour  Intake           459.01 ml  Output              328 ml  Net           131.01 ml   Intake/Output: I/O last 3 completed shifts: In: 783.8 [P.O.:520; I.V.:263.8] Out: 9371 [Urine:1025; Emesis/NG output:1; Stool:2]  Intake/Output this shift:  No intake/output data recorded. Weight change: 1 lb 6.4 oz (0.635 kg)  Gen: well-developed lady, in no acute distress. CVS: Regular rate and rhythm  Resp: bilaterally decreased breath sounds at bases, with few basal crackles. Abd: soft, non tender, bowel sounds positive. Ext: 2+ lower extremity edema,  little worse as compared to yesterday, left brachiocephalic fistula with good bruit.  Recent Labs Lab 12/26/16 0522 12/27/16 0524 12/28/16 0504 12/29/16 0435 12/30/16 0530 12/31/16 0804 01/01/17 0436  NA 136 135 136 139 139 135 133*  K 4.3 4.2 3.9 4.0 3.7 5.4* 4.3  CL 96* 96* 98* 99* 98* 95* 94*  CO2 27 27 27 29 30 27 26   GLUCOSE 220* 249* 267* 203* 145* 266* 245*  BUN 91* 85* 74* 71* 67* 68* 75*  CREATININE 3.68* 3.53* 3.01* 2.89* 2.86* 3.50* 4.06*  ALBUMIN  --  2.9*  --   --   --  2.9* 2.5*  CALCIUM 9.0 9.1 9.2 9.4 9.4 9.3 9.1  PHOS  --  3.6  --   --   --  4.1 4.1   Liver Function Tests:  Recent Labs Lab 12/27/16 0524 12/31/16 0804 01/01/17 0436  ALBUMIN 2.9* 2.9* 2.5*   No results for input(s): LIPASE,  AMYLASE in the last 168 hours. No results for input(s): AMMONIA in the last 168 hours. CBC:  Recent Labs Lab 12/26/16 0522 12/27/16 0524 12/28/16 0504 12/29/16 0435 12/30/16 0530 12/31/16 0804 01/01/17 0436  WBC 3.9* 3.5* 4.3 4.5 4.3 4.4 3.9*  NEUTROABS 2.7 2.3  --   --   --   --   --   HGB 8.6* 8.8* 9.2* 9.1* 8.5* 9.2* 8.2*  HCT 28.1* 28.3* 30.8* 30.6* 28.0* 31.9* 27.6*  MCV 94.9 95.0 96.3 97.1 97.2 99.7 97.9  PLT 221 214 236 227 207 225 201   Cardiac Enzymes: No results for input(s): CKTOTAL, CKMB, CKMBINDEX, TROPONINI in the last 168 hours. CBG:  Recent Labs Lab 12/31/16 1625 12/31/16 2124 12/31/16 2246 01/01/17 0732 01/01/17 1122  GLUCAP 182* 227* 216* 238* 200*    Iron Studies: No results for input(s): IRON, TIBC, TRANSFERRIN, FERRITIN in the last 72 hours. Studies/Results: No results found. . ALPRAZolam  0.5 mg Oral QHS  . amiodarone  200 mg Oral Daily  . calcitRIOL  0.5 mcg Oral Daily  . clopidogrel  75 mg Oral Daily  . cyclobenzaprine  5 mg Oral QHS  . darbepoetin (ARANESP) injection - NON-DIALYSIS  150 mcg Subcutaneous Q Mon-1800  . fluticasone  1 spray Each Nare Daily  . furosemide  160 mg Oral TID  . insulin aspart  0-15 Units Subcutaneous TID WC  . insulin glargine  20 Units Subcutaneous QHS  . isosorbide mononitrate  30 mg Oral BID  . latanoprost  1 drop Both Eyes QHS  . loratadine  10 mg Oral Daily  . meclizine  12.5 mg Oral BID  . mouth rinse  15 mL Mouth Rinse BID  . metoprolol succinate  25 mg Oral QHS  . pantoprazole  40 mg Oral Daily  . rosuvastatin  40 mg Oral q1800  . senna-docusate  2 tablet Oral BID  . sodium chloride flush  3 mL Intravenous Q12H  . warfarin  2 mg Oral ONCE-1800  . Warfarin - Pharmacist Dosing Inpatient   Does not apply q1800    BMET    Component Value Date/Time   NA 133 (L) 01/01/2017 0436   NA 141 02/18/2016 0937   K 4.3 01/01/2017 0436   CL 94 (L) 01/01/2017 0436   CO2 26 01/01/2017 0436   GLUCOSE 245 (H)  01/01/2017 0436   BUN 75 (H) 01/01/2017 0436   BUN 53 (H) 02/18/2016 0937   CREATININE 4.06 (H) 01/01/2017 0436   CREATININE 1.73 (H) 10/08/2013 1024   CALCIUM 9.1 01/01/2017 0436   GFRNONAA 10 (L) 01/01/2017 0436   GFRAA 12 (L) 01/01/2017 0436   CBC    Component Value Date/Time   WBC 3.9 (L) 01/01/2017 0436   RBC 2.82 (L) 01/01/2017 0436   HGB 8.2 (L) 01/01/2017 0436   HCT 27.6 (L) 01/01/2017 0436   PLT 201 01/01/2017 0436   MCV 97.9 01/01/2017 0436   MCH 29.1 01/01/2017 0436   MCHC 29.7 (L) 01/01/2017 0436   RDW 20.3 (H) 01/01/2017 0436   LYMPHSABS 0.8 12/27/2016 0524   MONOABS 0.4 12/27/2016 0524   EOSABS 0.1 12/27/2016 0524   BASOSABS 0.0 12/27/2016 0524     Assessment/Plan:  AKI with CKD.Creatinine continue to rise, GFR 12 today. She has decreased output. -Keep monitoring urine output and renal function. -Continue lasix .  Suspect worsening related to low bp periop.  Hyperkalemia. Resolved today.potassium supplement was discontinued yesterday. -keep monitoring.  HFrEF. Cardiology is following, continue gentle diuresis.  Anemia. Hemoglobin stableat 8.2today, did get onedose of Feraheme . Repeat iron studies shows good iron stores. -Continue Aranesp 150 per week.  Hypertension.normotensive today, hydralazine was discontinued yesterday. -continue monitoring and avoid hypotension.  P Afib. Rate controlled with amiodarone and beta blocker.   DM.  Controlled I have seen and examined this patient and agree with the plan of care seen, eval,examined, counseled. Discussed with resident .  Teyona Nichelson L 01/01/2017, 4:41 PM

## 2017-01-02 ENCOUNTER — Telehealth: Payer: Self-pay | Admitting: Vascular Surgery

## 2017-01-02 ENCOUNTER — Encounter (HOSPITAL_COMMUNITY): Payer: Self-pay | Admitting: Vascular Surgery

## 2017-01-02 LAB — CBC
HCT: 28.5 % — ABNORMAL LOW (ref 36.0–46.0)
Hemoglobin: 8.5 g/dL — ABNORMAL LOW (ref 12.0–15.0)
MCH: 28.9 pg (ref 26.0–34.0)
MCHC: 29.8 g/dL — ABNORMAL LOW (ref 30.0–36.0)
MCV: 96.9 fL (ref 78.0–100.0)
Platelets: 203 10*3/uL (ref 150–400)
RBC: 2.94 MIL/uL — ABNORMAL LOW (ref 3.87–5.11)
RDW: 20.4 % — ABNORMAL HIGH (ref 11.5–15.5)
WBC: 3.4 10*3/uL — ABNORMAL LOW (ref 4.0–10.5)

## 2017-01-02 LAB — RENAL FUNCTION PANEL
Albumin: 2.6 g/dL — ABNORMAL LOW (ref 3.5–5.0)
Anion gap: 11 (ref 5–15)
BUN: 77 mg/dL — ABNORMAL HIGH (ref 6–20)
CO2: 29 mmol/L (ref 22–32)
Calcium: 9.2 mg/dL (ref 8.9–10.3)
Chloride: 94 mmol/L — ABNORMAL LOW (ref 101–111)
Creatinine, Ser: 4.01 mg/dL — ABNORMAL HIGH (ref 0.44–1.00)
GFR calc Af Amer: 12 mL/min — ABNORMAL LOW
GFR calc non Af Amer: 10 mL/min — ABNORMAL LOW
Glucose, Bld: 195 mg/dL — ABNORMAL HIGH (ref 65–99)
Phosphorus: 3.9 mg/dL (ref 2.5–4.6)
Potassium: 3.9 mmol/L (ref 3.5–5.1)
Sodium: 134 mmol/L — ABNORMAL LOW (ref 135–145)

## 2017-01-02 LAB — GLUCOSE, CAPILLARY
GLUCOSE-CAPILLARY: 198 mg/dL — AB (ref 65–99)
GLUCOSE-CAPILLARY: 202 mg/dL — AB (ref 65–99)
Glucose-Capillary: 147 mg/dL — ABNORMAL HIGH (ref 65–99)
Glucose-Capillary: 208 mg/dL — ABNORMAL HIGH (ref 65–99)

## 2017-01-02 LAB — PROTIME-INR
INR: 2.55
Prothrombin Time: 27.3 s — ABNORMAL HIGH (ref 11.4–15.2)

## 2017-01-02 MED ORDER — WARFARIN SODIUM 1 MG PO TABS
1.0000 mg | ORAL_TABLET | Freq: Once | ORAL | Status: AC
  Administered 2017-01-02: 1 mg via ORAL
  Filled 2017-01-02: qty 1
  Filled 2017-01-02: qty 0.5
  Filled 2017-01-02: qty 1

## 2017-01-02 NOTE — Progress Notes (Addendum)
PROGRESS NOTE    [name]  JIR:[id number] DOB: [birth year] DOA: 12/23/2016 PCP: [name], MD   Brief Narrative: [name] is a 71 y.o. female with medical history significant for insulin-dependent diabetes mellitus, chronic kidney disease stage IV, chronic combined systolic/diastolic CHF, chronic atrial fibrillation, and coronary artery disease, now presenting into the emergency department at the direction of her nephrologist for evaluation of swelling and fatigue. Patient has been followed by cardiology and nephrology with worsening fluid status complicated by worsening kidney function. She has recently had her diuretics increased, but continues to be significantly up from her usual weight and her kidney function has worsened significantly over the past month or so. Patient denies any chest pain. She denies dyspnea while at rest. There has not been any fevers, chills, or significant cough. She reports fatigue, worsening progressively over the past couple months.     Assessment & Plan:   Principal Problem:   CKD (chronic kidney disease), stage IV (HCC) Active Problems:   Essential hypertension   Coronary artery disease   Acute on chronic combined systolic and diastolic CHF (congestive heart failure) (HCC)   Elevated troponin   DM type 2 causing vascular disease (HCC)   Pleural effusion on right   Anemia in chronic kidney disease   Atrial fibrillation (HCC)   Hypokalemia   Hypervolemia   Rhonchi   Acute on chronic combined systolic/diastolic CHF  Echo updated last month, EF 75-10%, grade 2 diastolic dysfunction, mild MR, severe LAE On oral lasix.  Nephrology helping with diuresis.  Negative for 4.8  L.  Weight 211----203--198--200--185 Holding  Metolazone due to increase cr.   Acute kidney injury superimposed on CKD stage IV  Plan for graft during this admission.  On oral lasix. And metolazone.  Underwent AV graft placement 10-26. Possible mild steal  post op.  Urine out put 800 cc.  Continue to monitor.  Cr stable at 4.   A fib;   Amiodarone, Continue with  coumadin.  INR at 2 .5  CAD,  Plavix, metoprolol/  Discussed with cardiology. Continue with plavix.   Diabetes; type 2;  Lantus. cbg in the 70, skip night dose lantus.    DVT prophylaxis: heparin  Code Status: full code.  Family Communication: patient  Disposition Plan: SNF when clear by nephrology    Consultants:   nephrology    Procedures: none   Antimicrobials: none   Subjective: She is sleepy today. They wake her up at 4 am to get labs.  Denies dyspnea.     Objective: Vitals:   01/01/17 1400 01/01/17 2115 01/02/17 0550 01/02/17 1000  BP: 120/68 (!) 116/58 (!) 108/49 101/64  Pulse: 60 60 63 62  Resp: 18 18 18    Temp: 98 F (36.7 C) 98.9 F (37.2 C) 98.8 F (37.1 C) 98.6 F (37 C)  TempSrc: Oral Oral Oral Oral  SpO2: 92% 96% 99% 100%  Weight:   84.2 kg (185 lb 9.6 oz)   Height:        Intake/Output Summary (Last 24 hours) at 01/02/17 1227 Last data filed at 01/02/17 0929  Gross per 24 hour  Intake              480 ml  Output              801 ml  Net             -321 ml   Filed Weights   12/31/16 0530 01/01/17 0550 01/02/17  0550  Weight: 90.1 kg (198 lb 9.6 oz) 90.7 kg (200 lb) 84.2 kg (185 lb 9.6 oz)    Examination:  General exam; NAD Respiratory system: Normal respiratory effort, CTA Cardiovascular system: S 1, S 2 RRR Gastrointestinal system: BS present, soft, nt  Central nervous system: non focal.  Extremities: symmetric power, plus 1 edema.  Skin: No rashes, lesions or ulcers   Data Reviewed: I have personally reviewed following labs and imaging studies  CBC:  Recent Labs Lab 12/27/16 0524  12/29/16 0435 12/30/16 0530 12/31/16 0804 01/01/17 0436 01/02/17 0535  WBC 3.5*  < > 4.5 4.3 4.4 3.9* 3.4*  NEUTROABS 2.3  --   --   --   --   --   --   HGB 8.8*  < > 9.1* 8.5* 9.2* 8.2* 8.5*  HCT 28.3*  < > 30.6* 28.0*  31.9* 27.6* 28.5*  MCV 95.0  < > 97.1 97.2 99.7 97.9 96.9  PLT 214  < > 227 207 225 201 203  < > = values in this interval not displayed. Basic Metabolic Panel:  Recent Labs Lab 12/27/16 0524  12/29/16 0435 12/30/16 0530 12/31/16 0804 01/01/17 0436 01/02/17 0535  NA 135  < > 139 139 135 133* 134*  K 4.2  < > 4.0 3.7 5.4* 4.3 3.9  CL 96*  < > 99* 98* 95* 94* 94*  CO2 27  < > 29 30 27 26 29   GLUCOSE 249*  < > 203* 145* 266* 245* 195*  BUN 85*  < > 71* 67* 68* 75* 77*  CREATININE 3.53*  < > 2.89* 2.86* 3.50* 4.06* 4.01*  CALCIUM 9.1  < > 9.4 9.4 9.3 9.1 9.2  PHOS 3.6  --   --   --  4.1 4.1 3.9  < > = values in this interval not displayed. GFR: Estimated Creatinine Clearance: 14.4 mL/min (A) (by C-G formula based on SCr of 4.01 mg/dL (H)). Liver Function Tests:  Recent Labs Lab 12/27/16 0524 12/31/16 0804 01/01/17 0436 01/02/17 0535  ALBUMIN 2.9* 2.9* 2.5* 2.6*   No results for input(s): LIPASE, AMYLASE in the last 168 hours. No results for input(s): AMMONIA in the last 168 hours. Coagulation Profile:  Recent Labs Lab 12/29/16 0709 12/30/16 0530 12/31/16 0804 01/01/17 0436 01/02/17 0535  INR 2.19 1.75 1.62 2.01 2.55   Cardiac Enzymes: No results for input(s): CKTOTAL, CKMB, CKMBINDEX, TROPONINI in the last 168 hours. BNP (last 3 results) No results for input(s): PROBNP in the last 8760 hours. HbA1C: No results for input(s): HGBA1C in the last 72 hours. CBG:  Recent Labs Lab 01/01/17 1122 01/01/17 1625 01/01/17 2030 01/02/17 0753 01/02/17 1138  GLUCAP 200* 141* 203* 147* 198*   Lipid Profile: No results for input(s): CHOL, HDL, LDLCALC, TRIG, CHOLHDL, LDLDIRECT in the last 72 hours. Thyroid Function Tests: No results for input(s): TSH, T4TOTAL, FREET4, T3FREE, THYROIDAB in the last 72 hours. Anemia Panel: No results for input(s): VITAMINB12, FOLATE, FERRITIN, TIBC, IRON, RETICCTPCT in the last 72 hours. Sepsis Labs:  Recent Labs Lab 12/26/16 1429   PROCALCITON 0.15    Recent Results (from the past 240 hour(s))  MRSA PCR Screening     Status: None   Collection Time: 12/24/16  1:39 AM  Result Value Ref Range Status   MRSA by PCR NEGATIVE NEGATIVE Final    Comment:        The GeneXpert MRSA Assay (FDA approved for NASAL specimens only), is one component of a comprehensive  MRSA colonization surveillance program. It is not intended to diagnose MRSA infection nor to guide or monitor treatment for MRSA infections.   Surgical pcr screen     Status: None   Collection Time: 12/28/16  7:26 PM  Result Value Ref Range Status   MRSA, PCR NEGATIVE NEGATIVE Final   Staphylococcus aureus NEGATIVE NEGATIVE Final    Comment: (NOTE) The Xpert SA Assay (FDA approved for NASAL specimens in patients 25 years of age and older), is one component of a comprehensive surveillance program. It is not intended to diagnose infection nor to guide or monitor treatment.          Radiology Studies: No results found.      Scheduled Meds: . ALPRAZolam  0.5 mg Oral QHS  . amiodarone  200 mg Oral Daily  . calcitRIOL  0.5 mcg Oral Daily  . clopidogrel  75 mg Oral Daily  . cyclobenzaprine  5 mg Oral QHS  . darbepoetin (ARANESP) injection - NON-DIALYSIS  150 mcg Subcutaneous Q Mon-1800  . fluticasone  1 spray Each Nare Daily  . furosemide  160 mg Oral TID  . insulin aspart  0-15 Units Subcutaneous TID WC  . insulin glargine  20 Units Subcutaneous QHS  . isosorbide mononitrate  30 mg Oral BID  . latanoprost  1 drop Both Eyes QHS  . loratadine  10 mg Oral Daily  . meclizine  12.5 mg Oral BID  . mouth rinse  15 mL Mouth Rinse BID  . metoprolol succinate  25 mg Oral QHS  . pantoprazole  40 mg Oral Daily  . rosuvastatin  40 mg Oral q1800  . senna-docusate  2 tablet Oral BID  . sodium chloride flush  3 mL Intravenous Q12H  . Warfarin - Pharmacist Dosing Inpatient   Does not apply q1800   Continuous Infusions: . sodium chloride    . sodium  chloride 10 mL/hr at 12/30/16 1254     LOS: 10 days    Time spent: 35 minutes.     Elmarie Shiley, MD [facility] Pager [phone]  If 7PM-7AM, please contact night-coverage [email or web address] Password [email or web address] 01/02/2017, 12:27 PM

## 2017-01-02 NOTE — Care Management Important Message (Signed)
Important Message  Patient Details  Name: [name] MRN: [id number] Date of Birth: [birth year]   Medicare Important Message Given:  Yes    Marcas Bowsher 01/02/2017, 11:09 AM

## 2017-01-02 NOTE — Telephone Encounter (Signed)
-----   Message from Mena Goes, RN sent at 12/31/2016 10:55 AM EDT ----- Regarding: 2 weeks with [facility]   ----- Message ----- From: Ulyses Amor, PA-C Sent: 12/31/2016   8:54 AM To: [facility] Charge Pool  F/U in 2 weeks with Dr. Scot Dock.  S/P left AV graft symptoms of mild steal numbness and weakness left hand

## 2017-01-02 NOTE — Telephone Encounter (Signed)
Sched appt 01/19/17 at 11:30. Ph# not working, mailed appt letter through regular mail.

## 2017-01-02 NOTE — Progress Notes (Signed)
Physical Therapy Treatment Patient Details Name: [name] MRN: [id number] DOB: [birth year] Today's Date: 01/02/2017    History of Present Illness 71 y.o. female PMH of  CAD (s/p NSTEMI in 06/2015 with DES to PDA and RCA at that time, subtotal occlusion of LCX treated medically; repeat cath 11/2015 with stable disease; inferolateral scar w/o ischemia), chronic combined systolic and diastolic CHF (EF 19-50% by echo in 11/2016), paroxysmal atrial fibrillation, right subclavian stenosis (s/p intervention), IDDM, HTN, HLD, and CKD Stage IV admitted on 12/23/2016 for worsening edema and fatigue. Pt being treated for acute on chronic combined systolic and diastolic CHF, acute on chronic Stage 4 CKD, paroxysmal A-fib, CAD, anemia, hypoprothrobinemia, and IDDM.     PT Comments    Patient required mod A for functional transfers and min/mod A for safe ambulation. Pt able to ambulate 72ft X2 with rest break required. Pt continues to demonstrate decreased strength and activity tolerance and will continue to benefit from further skilled PT services in acute and post acute setting.    Follow Up Recommendations  SNF     Equipment Recommendations  None recommended by PT    Recommendations for Other Services OT consult     Precautions / Restrictions Precautions Precautions: Fall    Mobility  Bed Mobility               General bed mobility comments: pt OOB in chair upon arrival  Transfers Overall transfer level: Needs assistance Equipment used: Rolling walker (2 wheeled) Transfers: Sit to/from Stand Sit to Stand: Mod assist         General transfer comment: assist to power up into standing; 2 attempts; cues for positioning and safe hand placement  Ambulation/Gait Ambulation/Gait assistance: Min assist;Mod assist Ambulation Distance (Feet):  (80ft X2 with standing rest break leaning against wall) Assistive device: Rolling walker (2 wheeled) Gait Pattern/deviations:  Step-through pattern;Decreased step length - right;Decreased step length - left;Trunk flexed Gait velocity: decreased   General Gait Details: cues for posture and safe proximity of RW; pt with flexed posture and with weak bilat knees with increased knee flexion last 61ft; assist to manage RW and to steady   Stairs            Wheelchair Mobility    Modified Rankin (Stroke Patients Only)       Balance Overall balance assessment: Needs assistance Sitting-balance support: No upper extremity supported;Feet supported Sitting balance-[name]: Good     Standing balance support: Bilateral upper extremity supported Standing balance-[name]: Poor                              Cognition Arousal/Alertness: Awake/alert Behavior During Therapy: WFL for tasks assessed/performed Overall Cognitive Status: Within Functional Limits for tasks assessed                                        Exercises General Exercises - Lower Extremity Ankle Circles/Pumps: AROM;Both;15 reps Long Arc Quad: AROM;Both;10 reps;Seated    General Comments General comments (skin integrity, edema, etc.): VSS and pt on RA      Pertinent Vitals/Pain Pain Assessment: Faces Faces Pain Scale: Hurts little more Pain Location: back Pain Descriptors / Indicators: Aching;Constant;Grimacing;Guarding Pain Intervention(s): Limited activity within patient's tolerance;Monitored during session;Repositioned;Patient requesting pain meds-RN notified    Home Living  Prior Function            PT Goals (current goals can now be found in the care plan section) Acute Rehab PT Goals Patient Stated Goal: Go to Rehab then home if possible PT Goal Formulation: With patient Time For Goal Achievement: 01/10/17 Potential to Achieve Goals: Fair Progress towards PT goals: Progressing toward goals    Frequency    Min 3X/week      PT Plan Current plan  remains appropriate    Co-evaluation              AM-PAC PT "6 Clicks" Daily Activity  Outcome Measure  Difficulty turning over in bed (including adjusting bedclothes, sheets and blankets)?: A Little Difficulty moving from lying on back to sitting on the side of the bed? : A Lot Difficulty sitting down on and standing up from a chair with arms (e.g., wheelchair, bedside commode, etc,.)?: Unable Help needed moving to and from a bed to chair (including a wheelchair)?: A Lot Help needed walking in hospital room?: A Lot Help needed climbing 3-5 steps with a railing? : Total 6 Click Score: 11    End of Session Equipment Utilized During Treatment: Gait belt Activity Tolerance: Patient limited by fatigue Patient left: in chair;with call bell/phone within reach Nurse Communication: Mobility status PT Visit Diagnosis: Unsteadiness on feet (R26.81);Other abnormalities of gait and mobility (R26.89);Muscle weakness (generalized) (M62.81)     Time: 9628-3662 PT Time Calculation (min) (ACUTE ONLY): 22 min  Charges:  $Gait Training: 8-22 mins                    G Codes:       Earney Navy, PTA Pager: [phone]     Darliss Cheney 01/02/2017, 4:31 PM

## 2017-01-02 NOTE — Progress Notes (Addendum)
Progress Note  Patient Name: [name] Date of Encounter: 01/02/2017  Primary Cardiologist: Prudy Feeler . Lovena Le   Subjective   71 year old female with a history of chronic diastolic congestive heart failure, progressive chronic kidney disease, paroxysmal atrial fibrillation, coronary artery disease  She is been diuresed through this admission. I/O is -4.6 liters so far this admission.   Her creatinine has gone up from 2.89 up to 4.01 today.    Inpatient Medications    Scheduled Meds: . ALPRAZolam  0.5 mg Oral QHS  . amiodarone  200 mg Oral Daily  . calcitRIOL  0.5 mcg Oral Daily  . clopidogrel  75 mg Oral Daily  . cyclobenzaprine  5 mg Oral QHS  . darbepoetin (ARANESP) injection - NON-DIALYSIS  150 mcg Subcutaneous Q Mon-1800  . fluticasone  1 spray Each Nare Daily  . furosemide  160 mg Oral TID  . insulin aspart  0-15 Units Subcutaneous TID WC  . insulin glargine  20 Units Subcutaneous QHS  . isosorbide mononitrate  30 mg Oral BID  . latanoprost  1 drop Both Eyes QHS  . loratadine  10 mg Oral Daily  . meclizine  12.5 mg Oral BID  . mouth rinse  15 mL Mouth Rinse BID  . metoprolol succinate  25 mg Oral QHS  . pantoprazole  40 mg Oral Daily  . rosuvastatin  40 mg Oral q1800  . senna-docusate  2 tablet Oral BID  . sodium chloride flush  3 mL Intravenous Q12H  . Warfarin - Pharmacist Dosing Inpatient   Does not apply q1800   Continuous Infusions: . sodium chloride    . sodium chloride 10 mL/hr at 12/30/16 1254   PRN Meds: sodium chloride, acetaminophen, albuterol, diazepam, ondansetron (ZOFRAN) IV, sodium chloride flush   Vital Signs    Vitals:   01/01/17 0550 01/01/17 1400 01/01/17 2115 01/02/17 0550  BP:  120/68 (!) 116/58 (!) 108/49  Pulse:  60 60 63  Resp:  18 18 18   Temp:  98 F (36.7 C) 98.9 F (37.2 C) 98.8 F (37.1 C)  TempSrc:  Oral Oral Oral  SpO2:  92% 96% 99%  Weight: 200 lb (90.7 kg)   185 lb 9.6 oz (84.2 kg)  Height:         Intake/Output Summary (Last 24 hours) at 01/02/17 0938 Last data filed at 01/02/17 0929  Gross per 24 hour  Intake              480 ml  Output              801 ml  Net             -321 ml   Filed Weights   12/31/16 0530 01/01/17 0550 01/02/17 0550  Weight: 198 lb 9.6 oz (90.1 kg) 200 lb (90.7 kg) 185 lb 9.6 oz (84.2 kg)    Telemetry    NSR  - Personally Reviewed  ECG     NSR  - Personally Reviewed  Physical Exam   GEN: No acute distress.   Neck: No JVD Cardiac: RRR, soft systlic  murmur, rubs, or gallops.  Respiratory: Clear to auscultation bilaterally. GI: Soft, nontender, non-distended  MS: trace leg  edema; she has had a dialysis fistula placed in her left upper arm. Neuro:  Nonfocal  Psych: Normal affect   Labs    Chemistry Recent Labs Lab 12/31/16 0804 01/01/17 0436 01/02/17 0535  NA 135 133* 134*  K 5.4* 4.3 3.9  CL 95* 94* 94*  CO2 27 26 29   GLUCOSE 266* 245* 195*  BUN 68* 75* 77*  CREATININE 3.50* 4.06* 4.01*  CALCIUM 9.3 9.1 9.2  ALBUMIN 2.9* 2.5* 2.6*  GFRNONAA 12* 10* 10*  GFRAA 14* 12* 12*  ANIONGAP 13 13 11      Hematology Recent Labs Lab 12/31/16 0804 01/01/17 0436 01/02/17 0535  WBC 4.4 3.9* 3.4*  RBC 3.20* 2.82* 2.94*  HGB 9.2* 8.2* 8.5*  HCT 31.9* 27.6* 28.5*  MCV 99.7 97.9 96.9  MCH 28.8 29.1 28.9  MCHC 28.8* 29.7* 29.8*  RDW 20.2* 20.3* 20.4*  PLT 225 201 203    Cardiac EnzymesNo results for input(s): TROPONINI in the last 168 hours. No results for input(s): TROPIPOC in the last 168 hours.   BNPNo results for input(s): BNP, PROBNP in the last 168 hours.   DDimer No results for input(s): DDIMER in the last 168 hours.   Radiology    No results found.  Cardiac Studies     Patient Profile     71 y.o. female with acute on chronic diastolic CHF. Has progressive CKD .   Assessment & Plan    1.  Acute on chronic combined systolic / diastolic congestive heart failure: Echocardiogram in September reveals moderate  to severe reduction of LV function with EF of 30-35%.  She has grade 2 diastolic dysfunction continue diuresis.   She will ultimately need to have dialysis for better control of her congestive heart failure  2.  Acute on chronic kidney disease: Further plans per nephrology Has had dialysis fistula placed in left upper arm   3.  Coronary artery disease: She has a known subtotal occlusion of her left circumflex artery.  She has not had any chest pain.  She is on Plavix.  4.  Paroxysmal atrial fibrillation: She is on amiodarone and metoprolol.  She currently is in normal sinus rhythm.  Coumadin has been restarted and her INR is therapeutic.    For questions or updates, please contact [place] Please consult [email or web address] for contact info under Cardiology/STEMI.      Signed, Mertie Moores, MD  01/02/2017, 9:38 AM

## 2017-01-02 NOTE — Progress Notes (Signed)
ANTICOAGULATION CONSULT NOTE - Follow Up Consult  Pharmacy Consult for Warfarin Indication: atrial fibrillation  Allergies  Allergen Reactions  . Ace Inhibitors Cough  . Amlodipine Swelling    UNSPECIFIED EDEMA   . Codeine Rash   Patient Measurements: Height: 5\' 8"  (172.7 cm) Weight: 185 lb 9.6 oz (84.2 kg) IBW/kg (Calculated) : 63.9  Vital Signs: Temp: 98.6 F (37 C) (10/29 1000) Temp Source: Oral (10/29 1000) BP: 101/64 (10/29 1000) Pulse Rate: 62 (10/29 1000)  Labs:  Recent Labs  12/31/16 0804 12/31/16 1847 01/01/17 0436 01/02/17 0535  HGB 9.2*  --  8.2* 8.5*  HCT 31.9*  --  27.6* 28.5*  PLT 225  --  201 203  LABPROT 19.1*  --  22.6* 27.3*  INR 1.62  --  2.01 2.55  HEPARINUNFRC 0.97* 0.43 0.40  --   CREATININE 3.50*  --  4.06* 4.01*    Estimated Creatinine Clearance: 14.4 mL/min (A) (by C-G formula based on SCr of 4.01 mg/dL (H)).   Assessment: 71 year old female to continue on warfarin for h/o atrial fibrillation. Recent aVF placement on 10/26. Heparin was stopped on 01/01/17 since INR  therapeutic. INR is 2.55 today. INR is therapeutic, however INR has increased  from 1.6 > 2.0 > 2.55  following only 4mg  and 2 mg of warfarin over past 2 days and s/p recent Vitamin K . INR noted to be supratherapeutic (INR= 5.1) on 10/22 requiring vitamin K and repeat dose of vitamin K  given 10/25.   HgB down slightly 8.5, no bleeding documented/reported. Pt with worsening renal function.  PTA warfarin regimen: 7.5mg  daily alternating with 3.75mg  (admit INR =4.38 on 12/23/16)   Goal of Therapy:  Heparin level 0.3-0.7 units/ml Monitor platelets by anticoagulation protocol: Yes   Plan:  Warfarin 1 mg PO x1 tonight Daily INR/CBC Monitor for s/sx of bleeding   Nicole Cella, RPh Clinical Pharmacist Pager: [phone] phone [phone] or [phone] Main pharmacy [phone] 01/02/2017 12:22 PM

## 2017-01-02 NOTE — Progress Notes (Signed)
S: 71 year old lady with a history of HFrEF, CKD, P Afib.Admitted for volume overload and worsening renal function.  No complaints today, urinary output remained little low, patient to have some incontinence. Creatinine keep trending up.   O:BP 101/64 (BP Location: Right Arm)   Pulse 62   Temp 98.6 F (37 C) (Oral)   Resp 18   Ht 5\' 8"  (1.727 m)   Wt 185 lb 9.6 oz (84.2 kg)   SpO2 100%   BMI 28.22 kg/m   Intake/Output Summary (Last 24 hours) at 01/02/17 1402 Last data filed at 01/02/17 1343  Gross per 24 hour  Intake              840 ml  Output              801 ml  Net               39 ml   Intake/Output: I/O last 3 completed shifts: In: 420.3 [P.O.:340; I.V.:80.3] Out: 928 [Urine:925; Stool:3]  Intake/Output this shift:  Total I/O In: 600 [P.O.:600] Out: -  Weight change: -14 lb 6.4 oz (-6.532 kg)  Gen: well-developed lady, in no acute distress. CVS: Regular rate and rhythm  Resp: bilaterally decreased breath sounds at bases,more pronounced at right base with few basal crackles. Abd: soft, non tender, bowel sounds positive. Ext: 3+ lower extremity edema,  little worse as compared to yesterday, left brachiocephalic fistula with good bruit.  Recent Labs Lab 12/27/16 0524 12/28/16 0504 12/29/16 0435 12/30/16 0530 12/31/16 0804 01/01/17 0436 01/02/17 0535  NA 135 136 139 139 135 133* 134*  K 4.2 3.9 4.0 3.7 5.4* 4.3 3.9  CL 96* 98* 99* 98* 95* 94* 94*  CO2 27 27 29 30 27 26 29   GLUCOSE 249* 267* 203* 145* 266* 245* 195*  BUN 85* 74* 71* 67* 68* 75* 77*  CREATININE 3.53* 3.01* 2.89* 2.86* 3.50* 4.06* 4.01*  ALBUMIN 2.9*  --   --   --  2.9* 2.5* 2.6*  CALCIUM 9.1 9.2 9.4 9.4 9.3 9.1 9.2  PHOS 3.6  --   --   --  4.1 4.1 3.9   Liver Function Tests:  Recent Labs Lab 12/31/16 0804 01/01/17 0436 01/02/17 0535  ALBUMIN 2.9* 2.5* 2.6*   No results for input(s): LIPASE, AMYLASE in the last 168 hours. No results for input(s): AMMONIA in the last 168  hours. CBC:  Recent Labs Lab 12/27/16 0524  12/29/16 0435 12/30/16 0530 12/31/16 0804 01/01/17 0436 01/02/17 0535  WBC 3.5*  < > 4.5 4.3 4.4 3.9* 3.4*  NEUTROABS 2.3  --   --   --   --   --   --   HGB 8.8*  < > 9.1* 8.5* 9.2* 8.2* 8.5*  HCT 28.3*  < > 30.6* 28.0* 31.9* 27.6* 28.5*  MCV 95.0  < > 97.1 97.2 99.7 97.9 96.9  PLT 214  < > 227 207 225 201 203  < > = values in this interval not displayed. Cardiac Enzymes: No results for input(s): CKTOTAL, CKMB, CKMBINDEX, TROPONINI in the last 168 hours. CBG:  Recent Labs Lab 01/01/17 1122 01/01/17 1625 01/01/17 2030 01/02/17 0753 01/02/17 1138  GLUCAP 200* 141* 203* 147* 198*    Iron Studies: No results for input(s): IRON, TIBC, TRANSFERRIN, FERRITIN in the last 72 hours. Studies/Results: No results found. . ALPRAZolam  0.5 mg Oral QHS  . amiodarone  200 mg Oral Daily  . calcitRIOL  0.5 mcg Oral Daily  .  clopidogrel  75 mg Oral Daily  . cyclobenzaprine  5 mg Oral QHS  . darbepoetin (ARANESP) injection - NON-DIALYSIS  150 mcg Subcutaneous Q Mon-1800  . fluticasone  1 spray Each Nare Daily  . furosemide  160 mg Oral TID  . insulin aspart  0-15 Units Subcutaneous TID WC  . insulin glargine  20 Units Subcutaneous QHS  . isosorbide mononitrate  30 mg Oral BID  . latanoprost  1 drop Both Eyes QHS  . loratadine  10 mg Oral Daily  . meclizine  12.5 mg Oral BID  . mouth rinse  15 mL Mouth Rinse BID  . metoprolol succinate  25 mg Oral QHS  . pantoprazole  40 mg Oral Daily  . rosuvastatin  40 mg Oral q1800  . senna-docusate  2 tablet Oral BID  . sodium chloride flush  3 mL Intravenous Q12H  . warfarin  1 mg Oral ONCE-1800  . Warfarin - Pharmacist Dosing Inpatient   Does not apply q1800    BMET    Component Value Date/Time   NA 134 (L) 01/02/2017 0535   NA 141 02/18/2016 0937   K 3.9 01/02/2017 0535   CL 94 (L) 01/02/2017 0535   CO2 29 01/02/2017 0535   GLUCOSE 195 (H) 01/02/2017 0535   BUN 77 (H) 01/02/2017 0535    BUN 53 (H) 02/18/2016 0937   CREATININE 4.01 (H) 01/02/2017 0535   CREATININE 1.73 (H) 10/08/2013 1024   CALCIUM 9.2 01/02/2017 0535   GFRNONAA 10 (L) 01/02/2017 0535   GFRAA 12 (L) 01/02/2017 0535   CBC    Component Value Date/Time   WBC 3.4 (L) 01/02/2017 0535   RBC 2.94 (L) 01/02/2017 0535   HGB 8.5 (L) 01/02/2017 0535   HCT 28.5 (L) 01/02/2017 0535   PLT 203 01/02/2017 0535   MCV 96.9 01/02/2017 0535   MCH 28.9 01/02/2017 0535   MCHC 29.8 (L) 01/02/2017 0535   RDW 20.4 (H) 01/02/2017 0535   LYMPHSABS 0.8 12/27/2016 0524   MONOABS 0.4 12/27/2016 0524   EOSABS 0.1 12/27/2016 0524   BASOSABS 0.0 12/27/2016 0524     Assessment/Plan:  AKI with CKD.Creatinine continue to rise. Her urinary output is sub optimal,I'm not sure about the recording as patient has couple of accidents.we suspect another renal insult due to hypotension while having surgery. -metolazone was discontinued. -Continue Lasix and keep monitoring renal function, she might need to start on dialysis early.  Hyperkalemia. Resolved. -Keep monitoring electrolyte,  HFrEF. Cardiology is following, continue gentle diuresis.  Anemia. Hemoglobin stableat 8.2today, did get onedose of Feraheme . Repeat iron studies shows good iron stores. -Continue Aranesp 150 per week.  Hypertension. Blood pressure little soft today. -keep monitoring didn't avoid hypotension.   Afib. Rate controlled with amiodarone and beta blocker.   DM.  Controlled.  [name]

## 2017-01-03 ENCOUNTER — Ambulatory Visit: Payer: BLUE CROSS/BLUE SHIELD | Admitting: Internal Medicine

## 2017-01-03 LAB — CBC
HEMATOCRIT: 28.5 % — AB (ref 36.0–46.0)
HEMOGLOBIN: 8.5 g/dL — AB (ref 12.0–15.0)
MCH: 28.7 pg (ref 26.0–34.0)
MCHC: 29.8 g/dL — ABNORMAL LOW (ref 30.0–36.0)
MCV: 96.3 fL (ref 78.0–100.0)
Platelets: 185 10*3/uL (ref 150–400)
RBC: 2.96 MIL/uL — AB (ref 3.87–5.11)
RDW: 20 % — ABNORMAL HIGH (ref 11.5–15.5)
WBC: 4.2 10*3/uL (ref 4.0–10.5)

## 2017-01-03 LAB — RENAL FUNCTION PANEL
ALBUMIN: 2.5 g/dL — AB (ref 3.5–5.0)
ANION GAP: 12 (ref 5–15)
BUN: 82 mg/dL — ABNORMAL HIGH (ref 6–20)
CALCIUM: 9.1 mg/dL (ref 8.9–10.3)
CO2: 27 mmol/L (ref 22–32)
Chloride: 93 mmol/L — ABNORMAL LOW (ref 101–111)
Creatinine, Ser: 4.16 mg/dL — ABNORMAL HIGH (ref 0.44–1.00)
GFR, EST AFRICAN AMERICAN: 11 mL/min — AB (ref >60)
GFR, EST NON AFRICAN AMERICAN: 10 mL/min — AB (ref >60)
Glucose, Bld: 201 mg/dL — ABNORMAL HIGH (ref 65–99)
PHOSPHORUS: 4.1 mg/dL (ref 2.5–4.6)
POTASSIUM: 3.3 mmol/L — AB (ref 3.5–5.1)
Sodium: 132 mmol/L — ABNORMAL LOW (ref 135–145)

## 2017-01-03 LAB — GLUCOSE, CAPILLARY
GLUCOSE-CAPILLARY: 190 mg/dL — AB (ref 65–99)
GLUCOSE-CAPILLARY: 245 mg/dL — AB (ref 65–99)
GLUCOSE-CAPILLARY: 303 mg/dL — AB (ref 65–99)
Glucose-Capillary: 318 mg/dL — ABNORMAL HIGH (ref 65–99)

## 2017-01-03 LAB — PROTIME-INR
INR: 2.62
Prothrombin Time: 27.8 seconds — ABNORMAL HIGH (ref 11.4–15.2)

## 2017-01-03 MED ORDER — FUROSEMIDE 80 MG PO TABS
120.0000 mg | ORAL_TABLET | Freq: Two times a day (BID) | ORAL | Status: DC
  Administered 2017-01-03 – 2017-01-04 (×2): 120 mg via ORAL
  Filled 2017-01-03 (×2): qty 1

## 2017-01-03 MED ORDER — WARFARIN SODIUM 2 MG PO TABS
2.0000 mg | ORAL_TABLET | Freq: Once | ORAL | Status: AC
  Administered 2017-01-03: 2 mg via ORAL
  Filled 2017-01-03: qty 1

## 2017-01-03 MED ORDER — POTASSIUM CHLORIDE CRYS ER 20 MEQ PO TBCR
20.0000 meq | EXTENDED_RELEASE_TABLET | Freq: Two times a day (BID) | ORAL | Status: AC
  Administered 2017-01-03 (×2): 20 meq via ORAL
  Filled 2017-01-03 (×2): qty 1

## 2017-01-03 NOTE — Progress Notes (Signed)
Progress Note  Patient Name: [name] Date of Encounter: 01/03/2017  Primary Cardiologist: Rolly Salter   Subjective   71 year old female with a history of chronic diastolic congestive heart failure, progressive chronic kidney disease, paroxysmal atrial fibrillation, coronary artery disease.  She was admitted with worsening congestive heart failure.     she is diuresed nicely. I/O -5.2 liters so far this admmission Wt. 199 Creatinine continues to increase - 4.16 on Oct. 30   Inpatient Medications    Scheduled Meds: . ALPRAZolam  0.5 mg Oral QHS  . amiodarone  200 mg Oral Daily  . calcitRIOL  0.5 mcg Oral Daily  . clopidogrel  75 mg Oral Daily  . cyclobenzaprine  5 mg Oral QHS  . darbepoetin (ARANESP) injection - NON-DIALYSIS  150 mcg Subcutaneous Q Mon-1800  . fluticasone  1 spray Each Nare Daily  . furosemide  160 mg Oral TID  . insulin aspart  0-15 Units Subcutaneous TID WC  . insulin glargine  20 Units Subcutaneous QHS  . isosorbide mononitrate  30 mg Oral BID  . latanoprost  1 drop Both Eyes QHS  . loratadine  10 mg Oral Daily  . meclizine  12.5 mg Oral BID  . mouth rinse  15 mL Mouth Rinse BID  . metoprolol succinate  25 mg Oral QHS  . pantoprazole  40 mg Oral Daily  . rosuvastatin  40 mg Oral q1800  . senna-docusate  2 tablet Oral BID  . sodium chloride flush  3 mL Intravenous Q12H  . warfarin  2 mg Oral ONCE-1800  . Warfarin - Pharmacist Dosing Inpatient   Does not apply q1800   Continuous Infusions: . sodium chloride    . sodium chloride 10 mL/hr at 12/30/16 1254   PRN Meds: sodium chloride, acetaminophen, albuterol, diazepam, ondansetron (ZOFRAN) IV, sodium chloride flush   Vital Signs    Vitals:   01/02/17 0550 01/02/17 1000 01/02/17 2100 01/03/17 0541  BP: (!) 108/49 101/64 (!) 127/43 (!) 108/51  Pulse: 63 62 70 71  Resp: 18  18 18   Temp: 98.8 F (37.1 C) 98.6 F (37 C) (!) 97.1 F (36.2 C) 98.3 F (36.8 C)  TempSrc: Oral Oral  Oral Oral  SpO2: 99% 100% 100% 96%  Weight: 185 lb 9.6 oz (84.2 kg)   199 lb 12.8 oz (90.6 kg)  Height:        Intake/Output Summary (Last 24 hours) at 01/03/17 1001 Last data filed at 01/03/17 1000  Gross per 24 hour  Intake              600 ml  Output              900 ml  Net             -300 ml   Filed Weights   01/01/17 0550 01/02/17 0550 01/03/17 0541  Weight: 200 lb (90.7 kg) 185 lb 9.6 oz (84.2 kg) 199 lb 12.8 oz (90.6 kg)    Telemetry    NSR at 68  - Personally Reviewed  ECG     NSR  - Personally Reviewed  Physical Exam   GEN: No acute distress.   She is somnolent today. Neck: No JVD Cardiac: RRR, soft systolic murmur, rubs, or gallops.  Respiratory: Clear to auscultation bilaterally. GI: Soft, nontender, non-distended  MS: No edema; No deformity. Neuro:  Nonfocal  Psych: Normal affect   Labs    Chemistry Recent Labs Lab 01/01/17 0436 01/02/17 0535  01/03/17 0606  NA 133* 134* 132*  K 4.3 3.9 3.3*  CL 94* 94* 93*  CO2 26 29 27   GLUCOSE 245* 195* 201*  BUN 75* 77* 82*  CREATININE 4.06* 4.01* 4.16*  CALCIUM 9.1 9.2 9.1  ALBUMIN 2.5* 2.6* 2.5*  GFRNONAA 10* 10* 10*  GFRAA 12* 12* 11*  ANIONGAP 13 11 12      Hematology Recent Labs Lab 01/01/17 0436 01/02/17 0535 01/03/17 0606  WBC 3.9* 3.4* 4.2  RBC 2.82* 2.94* 2.96*  HGB 8.2* 8.5* 8.5*  HCT 27.6* 28.5* 28.5*  MCV 97.9 96.9 96.3  MCH 29.1 28.9 28.7  MCHC 29.7* 29.8* 29.8*  RDW 20.3* 20.4* 20.0*  PLT 201 203 185    Cardiac EnzymesNo results for input(s): TROPONINI in the last 168 hours. No results for input(s): TROPIPOC in the last 168 hours.   BNPNo results for input(s): BNP, PROBNP in the last 168 hours.   DDimer No results for input(s): DDIMER in the last 168 hours.   Radiology    No results found.  Cardiac Studies     Patient Profile     71 y.o. female with progressive renal failure and acute on chronic diastolic congestive heart failure.  Assessment & Plan    1.   Acute on chronic diastolic congestive heart failure: Patient presents with worsening heart failure and also worsening kidney disease.  I suspect she will need to be considered for dialysis.  We have been able to diurese her but it is at the expense of rising creatinine  Continue current medications for now.  Further plans per internal medicine team and nephrology.  2.  Coronary artery disease: She has a known subtotal occlusion of the left circumflex artery.  She denies any chest pain.  She is on Plavix.  3.  Paroxysmal atrial fibrillation: She is on amiodarone and metoprolol.  He currently is in normal sinus rhythm.  We started Coumadin.  For questions or updates, please contact [place] Please consult [email or web address] for contact info under Cardiology/STEMI.      Signed, Mertie Moores, MD  01/03/2017, 10:01 AM

## 2017-01-03 NOTE — Progress Notes (Signed)
ANTICOAGULATION CONSULT NOTE - Follow Up Consult  Pharmacy Consult for Warfarin Indication: atrial fibrillation  Allergies  Allergen Reactions  . Ace Inhibitors Cough  . Amlodipine Swelling    UNSPECIFIED EDEMA   . Codeine Rash   Patient Measurements: Height: 5\' 8"  (172.7 cm) Weight: 199 lb 12.8 oz (90.6 kg) (scale c x2) IBW/kg (Calculated) : 63.9  Vital Signs: Temp: 98.3 F (36.8 C) (10/30 0541) Temp Source: Oral (10/30 0541) BP: 108/51 (10/30 0541) Pulse Rate: 71 (10/30 0541)  Labs:  Recent Labs  12/31/16 0804 12/31/16 1847 01/01/17 0436 01/02/17 0535 01/03/17 0606  HGB 9.2*  --  8.2* 8.5* 8.5*  HCT 31.9*  --  27.6* 28.5* 28.5*  PLT 225  --  201 203 185  LABPROT 19.1*  --  22.6* 27.3* 27.8*  INR 1.62  --  2.01 2.55 2.62  HEPARINUNFRC 0.97* 0.43 0.40  --   --   CREATININE 3.50*  --  4.06* 4.01* 4.16*    Estimated Creatinine Clearance: 14.4 mL/min (A) (by C-G formula based on SCr of 4.16 mg/dL (H)).   Assessment: 71 year old female to continue on warfarin for h/o atrial fibrillation. Recent aVF placement on 10/26. Heparin was stopped on 01/01/17 since INR  therapeutic.   INR today = 2.62  PTA warfarin regimen: 7.5mg  daily alternating with 3.75mg  (admit INR =4.38 on 12/23/16)   Goal of Therapy:  Heparin level 0.3-0.7 units/ml Monitor platelets by anticoagulation protocol: Yes   Plan:  Warfarin 2 mg PO x1 tonight Daily INR/CBC Monitor for s/sx of bleeding  Thank you Anette Guarneri, PharmD [phone] 01/03/2017 7:48 AM

## 2017-01-03 NOTE — Progress Notes (Signed)
S:71 year old lady with a history of HFrEF, CKD, P Afib.Admitted for volume overload and worsening renal function.  No complaints today.Creatinine continue to trend up.  O:BP (!) 108/51 (BP Location: Right Arm)   Pulse 71   Temp 98.3 F (36.8 C) (Oral)   Resp 18   Ht 5\' 8"  (1.727 m)   Wt 199 lb 11.2 oz (90.6 kg)   SpO2 96%   BMI 30.36 kg/m   Intake/Output Summary (Last 24 hours) at 01/03/17 1155 Last data filed at 01/03/17 1000  Gross per 24 hour  Intake              600 ml  Output              900 ml  Net             -300 ml   Intake/Output: I/O last 3 completed shifts: In: 600 [P.O.:600] Out: 1701 [Urine:1700; Stool:1]  Intake/Output this shift:  Total I/O In: 240 [P.O.:240] Out: -  Weight change: 14 lb 3.2 oz (6.441 kg)  Gen: well-developed lady, in no acute distress. CVS: Regular rate and rhythm  Resp: bilaterally decreased breath sounds at bases, with few basal crackles. Abd: soft, non tender, bowel sounds positive. Ext: 3+ lower extremity edema, left brachiocephalic fistula with good bruit.  Recent Labs Lab 12/28/16 0504 12/29/16 0435 12/30/16 0530 12/31/16 0804 01/01/17 0436 01/02/17 0535 01/03/17 0606  NA 136 139 139 135 133* 134* 132*  K 3.9 4.0 3.7 5.4* 4.3 3.9 3.3*  CL 98* 99* 98* 95* 94* 94* 93*  CO2 27 29 30 27 26 29 27   GLUCOSE 267* 203* 145* 266* 245* 195* 201*  BUN 74* 71* 67* 68* 75* 77* 82*  CREATININE 3.01* 2.89* 2.86* 3.50* 4.06* 4.01* 4.16*  ALBUMIN  --   --   --  2.9* 2.5* 2.6* 2.5*  CALCIUM 9.2 9.4 9.4 9.3 9.1 9.2 9.1  PHOS  --   --   --  4.1 4.1 3.9 4.1   Liver Function Tests:  Recent Labs Lab 01/01/17 0436 01/02/17 0535 01/03/17 0606  ALBUMIN 2.5* 2.6* 2.5*   No results for input(s): LIPASE, AMYLASE in the last 168 hours. No results for input(s): AMMONIA in the last 168 hours. CBC:  Recent Labs Lab 12/30/16 0530 12/31/16 0804 01/01/17 0436 01/02/17 0535 01/03/17 0606  WBC 4.3 4.4 3.9* 3.4* 4.2  HGB 8.5* 9.2*  8.2* 8.5* 8.5*  HCT 28.0* 31.9* 27.6* 28.5* 28.5*  MCV 97.2 99.7 97.9 96.9 96.3  PLT 207 225 201 203 185   Cardiac Enzymes: No results for input(s): CKTOTAL, CKMB, CKMBINDEX, TROPONINI in the last 168 hours. CBG:  Recent Labs Lab 01/02/17 1138 01/02/17 1711 01/02/17 2115 01/03/17 0750 01/03/17 1137  GLUCAP 198* 202* 208* 190* 245*    Iron Studies: No results for input(s): IRON, TIBC, TRANSFERRIN, FERRITIN in the last 72 hours. Studies/Results: No results found. . ALPRAZolam  0.5 mg Oral QHS  . amiodarone  200 mg Oral Daily  . calcitRIOL  0.5 mcg Oral Daily  . clopidogrel  75 mg Oral Daily  . cyclobenzaprine  5 mg Oral QHS  . darbepoetin (ARANESP) injection - NON-DIALYSIS  150 mcg Subcutaneous Q Mon-1800  . fluticasone  1 spray Each Nare Daily  . furosemide  120 mg Oral BID  . insulin aspart  0-15 Units Subcutaneous TID WC  . insulin glargine  20 Units Subcutaneous QHS  . isosorbide mononitrate  30 mg Oral BID  . latanoprost  1 drop Both Eyes QHS  . loratadine  10 mg Oral Daily  . meclizine  12.5 mg Oral BID  . mouth rinse  15 mL Mouth Rinse BID  . metoprolol succinate  25 mg Oral QHS  . pantoprazole  40 mg Oral Daily  . rosuvastatin  40 mg Oral q1800  . senna-docusate  2 tablet Oral BID  . sodium chloride flush  3 mL Intravenous Q12H  . warfarin  2 mg Oral ONCE-1800  . Warfarin - Pharmacist Dosing Inpatient   Does not apply q1800    BMET    Component Value Date/Time   NA 132 (L) 01/03/2017 0606   NA 141 02/18/2016 0937   K 3.3 (L) 01/03/2017 0606   CL 93 (L) 01/03/2017 0606   CO2 27 01/03/2017 0606   GLUCOSE 201 (H) 01/03/2017 0606   BUN 82 (H) 01/03/2017 0606   BUN 53 (H) 02/18/2016 0937   CREATININE 4.16 (H) 01/03/2017 0606   CREATININE 1.73 (H) 10/08/2013 1024   CALCIUM 9.1 01/03/2017 0606   GFRNONAA 10 (L) 01/03/2017 0606   GFRAA 11 (L) 01/03/2017 0606   CBC    Component Value Date/Time   WBC 4.2 01/03/2017 0606   RBC 2.96 (L) 01/03/2017 0606    HGB 8.5 (L) 01/03/2017 0606   HCT 28.5 (L) 01/03/2017 0606   PLT 185 01/03/2017 0606   MCV 96.3 01/03/2017 0606   MCH 28.7 01/03/2017 0606   MCHC 29.8 (L) 01/03/2017 0606   RDW 20.0 (H) 01/03/2017 0606   LYMPHSABS 0.8 12/27/2016 0524   MONOABS 0.4 12/27/2016 0524   EOSABS 0.1 12/27/2016 0524   BASOSABS 0.0 12/27/2016 0524     Assessment/Plan:  AKI with CKD.Creatinine continue to rise. Her urinary output is sub optimal.Blood pressure on a softer side. -Decrease Lasix to 120 mg twice daily, and keep monitoring renal function. -If continued to get worse, she might need dialysis early.  Mild hypokalemia.replaced with oral potassium of 20 mEq. Keep monitoring.  HFrEF. Cardiology is following, continue gentle diuresis.  Anemia. Hemoglobin stable,  had 1 dose of Feraheme. -continue monitoring and Aranesp 150 per week.  Hypertension. Blood pressure little soft today. -keep monitoring and avoid hypotension.  Afib. Rate controlled with amiodarone and beta blocker.   DM. Controlled.  Lorella Nimrod

## 2017-01-03 NOTE — Progress Notes (Signed)
Inpatient Diabetes Program Recommendations  AACE/ADA: New Consensus Statement on Inpatient Glycemic Control (2015)  Target Ranges:  Prepandial:   less than 140 mg/dL      Peak postprandial:   less than 180 mg/dL (1-2 hours)      Critically ill patients:  140 - 180 mg/dL   Lab Results  Component Value Date   GLUCAP 190 (H) 01/03/2017   HGBA1C 8.8 (H) 10/31/2016   Review of Glycemic Control  Diabetes history: DM 2 Outpatient Diabetes medications: Lantus 40 units, Novolog 10 units tid meal coverage Current orders for Inpatient glycemic control: Lantus 20 units, NOvolog Moderate 0-15 units tid  A1c 8.8% on 10/31/16  Inpatient Diabetes Program Recommendations:    Glucose increases with meals. Please consider Novolog 3 units tid with meals if patient consumes at least 50% of meals.  Thanks,  Tama Headings RN, MSN, [facility] Inpatient Diabetes Coordinator Team Pager [phone] (8a-5p)

## 2017-01-03 NOTE — Progress Notes (Signed)
PROGRESS NOTE    [name]  ZJI:[id number] DOB: 01-23-1946 DOA: 12/23/2016 PCP: [name], MD   Brief Narrative: [name] is a 71 y.o. female with medical history significant for insulin-dependent diabetes mellitus, chronic kidney disease stage IV, chronic combined systolic/diastolic CHF, chronic atrial fibrillation, and coronary artery disease, now presenting into the emergency department at the direction of her nephrologist for evaluation of swelling and fatigue. Patient has been followed by cardiology and nephrology with worsening fluid status complicated by worsening kidney function. She has recently had her diuretics increased, but continues to be significantly up from her usual weight and her kidney function has worsened significantly over the past month or so. Patient denies any chest pain. She denies dyspnea while at rest. There has not been any fevers, chills, or significant cough. She reports fatigue, worsening progressively over the past couple months.   Admitted with acute on chronic diastolic heart failure secondary to Acute on chronic renal failure.  She was started lasix, metolazone. Renal function fluctuates,. Nephrology following. They are considering start HD if renal function doesn't improved.    Assessment & Plan:   Principal Problem:   CKD (chronic kidney disease), stage IV (HCC) Active Problems:   Essential hypertension   Coronary artery disease   Acute on chronic combined systolic and diastolic CHF (congestive heart failure) (HCC)   Elevated troponin   DM type 2 causing vascular disease (HCC)   Pleural effusion on right   Anemia in chronic kidney disease   Atrial fibrillation (HCC)   Hypokalemia   Hypervolemia   Rhonchi   Acute on chronic combined systolic/diastolic CHF  Echo updated last month, EF 17-51%, grade 2 diastolic dysfunction, mild MR, severe LAE On oral lasix.  Nephrology helping with diuresis.  Negative for 4.8  L.  Weight  211----203--198--200--185--199 Holding  Metolazone due to increase cr.  Weight going up, cr at 4.1, urine out put down to 800 cc.  Plan to change lasix to BID. Continue to monitor renal function. Might need HD>   Acute kidney injury superimposed on CKD stage IV  Plan for graft during this admission.  On oral lasix. And metolazone.  Underwent AV graft placement 10-26. Possible mild steal post op.  Plan to change lasix to BID. Continue to monitor renal function. Might need HD>    A fib;   Amiodarone, Continue with  coumadin.  INR at 2 .5  CAD,  Plavix, metoprolol/  Discussed with cardiology. Continue with plavix.   Diabetes; type 2;  Lantus. cbg in the 70, skip night dose lantus.    DVT prophylaxis: heparin  Code Status: full code.  Family Communication: patient  Disposition Plan: SNF when clear by nephrology    Consultants:   nephrology    Procedures: none   Antimicrobials: none   Subjective: Denies worsening dyspnea.  She can not believe she gain 4 pounds.    Objective: Vitals:   01/02/17 2100 01/03/17 0541 01/03/17 1134 01/03/17 1227  BP: (!) 127/43 (!) 108/51  (!) 115/50  Pulse: 70 71  67  Resp: 18 18  18   Temp: (!) 97.1 F (36.2 C) 98.3 F (36.8 C)  98 F (36.7 C)  TempSrc: Oral Oral  Oral  SpO2: 100% 96%  98%  Weight:  90.6 kg (199 lb 12.8 oz) 90.6 kg (199 lb 11.2 oz)   Height:        Intake/Output Summary (Last 24 hours) at 01/03/17 1506 Last data filed at 01/03/17 1307  Gross per 24 hour  Intake              480 ml  Output              900 ml  Net             -420 ml   Filed Weights   01/02/17 0550 01/03/17 0541 01/03/17 1134  Weight: 84.2 kg (185 lb 9.6 oz) 90.6 kg (199 lb 12.8 oz) 90.6 kg (199 lb 11.2 oz)    Examination:  General exam; NAD Respiratory system: CTA Cardiovascular system: S 1, S 2 RRR Gastrointestinal system:  BS present, soft, nt Central nervous system: non focal.  Extremities: symmetric power, plus 1 edema.    Skin: no rash    Data Reviewed: I have personally reviewed following labs and imaging studies  CBC:  Recent Labs Lab 12/30/16 0530 12/31/16 0804 01/01/17 0436 01/02/17 0535 01/03/17 0606  WBC 4.3 4.4 3.9* 3.4* 4.2  HGB 8.5* 9.2* 8.2* 8.5* 8.5*  HCT 28.0* 31.9* 27.6* 28.5* 28.5*  MCV 97.2 99.7 97.9 96.9 96.3  PLT 207 225 201 203 277   Basic Metabolic Panel:  Recent Labs Lab 12/30/16 0530 12/31/16 0804 01/01/17 0436 01/02/17 0535 01/03/17 0606  NA 139 135 133* 134* 132*  K 3.7 5.4* 4.3 3.9 3.3*  CL 98* 95* 94* 94* 93*  CO2 30 27 26 29 27   GLUCOSE 145* 266* 245* 195* 201*  BUN 67* 68* 75* 77* 82*  CREATININE 2.86* 3.50* 4.06* 4.01* 4.16*  CALCIUM 9.4 9.3 9.1 9.2 9.1  PHOS  --  4.1 4.1 3.9 4.1   GFR: Estimated Creatinine Clearance: 14.4 mL/min (A) (by C-G formula based on SCr of 4.16 mg/dL (H)). Liver Function Tests:  Recent Labs Lab 12/31/16 0804 01/01/17 0436 01/02/17 0535 01/03/17 0606  ALBUMIN 2.9* 2.5* 2.6* 2.5*   No results for input(s): LIPASE, AMYLASE in the last 168 hours. No results for input(s): AMMONIA in the last 168 hours. Coagulation Profile:  Recent Labs Lab 12/30/16 0530 12/31/16 0804 01/01/17 0436 01/02/17 0535 01/03/17 0606  INR 1.75 1.62 2.01 2.55 2.62   Cardiac Enzymes: No results for input(s): CKTOTAL, CKMB, CKMBINDEX, TROPONINI in the last 168 hours. BNP (last 3 results) No results for input(s): PROBNP in the last 8760 hours. HbA1C: No results for input(s): HGBA1C in the last 72 hours. CBG:  Recent Labs Lab 01/02/17 1138 01/02/17 1711 01/02/17 2115 01/03/17 0750 01/03/17 1137  GLUCAP 198* 202* 208* 190* 245*   Lipid Profile: No results for input(s): CHOL, HDL, LDLCALC, TRIG, CHOLHDL, LDLDIRECT in the last 72 hours. Thyroid Function Tests: No results for input(s): TSH, T4TOTAL, FREET4, T3FREE, THYROIDAB in the last 72 hours. Anemia Panel: No results for input(s): VITAMINB12, FOLATE, FERRITIN, TIBC, IRON,  RETICCTPCT in the last 72 hours. Sepsis Labs: No results for input(s): PROCALCITON, LATICACIDVEN in the last 168 hours.  Recent Results (from the past 240 hour(s))  Surgical pcr screen     Status: None   Collection Time: 12/28/16  7:26 PM  Result Value Ref Range Status   MRSA, PCR NEGATIVE NEGATIVE Final   Staphylococcus aureus NEGATIVE NEGATIVE Final    Comment: (NOTE) The Xpert SA Assay (FDA approved for NASAL specimens in patients 60 years of age and older), is one component of a comprehensive surveillance program. It is not intended to diagnose infection nor to guide or monitor treatment.          Radiology Studies: No results found.  Scheduled Meds: . ALPRAZolam  0.5 mg Oral QHS  . amiodarone  200 mg Oral Daily  . calcitRIOL  0.5 mcg Oral Daily  . clopidogrel  75 mg Oral Daily  . cyclobenzaprine  5 mg Oral QHS  . darbepoetin (ARANESP) injection - NON-DIALYSIS  150 mcg Subcutaneous Q Mon-1800  . fluticasone  1 spray Each Nare Daily  . furosemide  120 mg Oral BID  . insulin aspart  0-15 Units Subcutaneous TID WC  . insulin glargine  20 Units Subcutaneous QHS  . isosorbide mononitrate  30 mg Oral BID  . latanoprost  1 drop Both Eyes QHS  . loratadine  10 mg Oral Daily  . meclizine  12.5 mg Oral BID  . mouth rinse  15 mL Mouth Rinse BID  . metoprolol succinate  25 mg Oral QHS  . pantoprazole  40 mg Oral Daily  . potassium chloride  20 mEq Oral BID  . rosuvastatin  40 mg Oral q1800  . senna-docusate  2 tablet Oral BID  . sodium chloride flush  3 mL Intravenous Q12H  . warfarin  2 mg Oral ONCE-1800  . Warfarin - Pharmacist Dosing Inpatient   Does not apply q1800   Continuous Infusions: . sodium chloride    . sodium chloride 10 mL/hr at 12/30/16 1254     LOS: 11 days    Time spent: 35 minutes.     Elmarie Shiley, MD [facility] Pager [phone]  If 7PM-7AM, please contact night-coverage [email or web address] Password [email or web address] 01/03/2017,  3:06 PM

## 2017-01-03 NOTE — Clinical Social Work Note (Signed)
CSW continues to follow for discharge needs.  Emitt Maglione, CSW [phone]  

## 2017-01-04 LAB — RENAL FUNCTION PANEL
Albumin: 2.5 g/dL — ABNORMAL LOW (ref 3.5–5.0)
Anion gap: 12 (ref 5–15)
BUN: 84 mg/dL — AB (ref 6–20)
CHLORIDE: 94 mmol/L — AB (ref 101–111)
CO2: 27 mmol/L (ref 22–32)
CREATININE: 4.22 mg/dL — AB (ref 0.44–1.00)
Calcium: 8.9 mg/dL (ref 8.9–10.3)
GFR calc Af Amer: 11 mL/min — ABNORMAL LOW (ref >60)
GFR calc non Af Amer: 10 mL/min — ABNORMAL LOW (ref >60)
GLUCOSE: 278 mg/dL — AB (ref 65–99)
Phosphorus: 4.2 mg/dL (ref 2.5–4.6)
Potassium: 3.5 mmol/L (ref 3.5–5.1)
SODIUM: 133 mmol/L — AB (ref 135–145)

## 2017-01-04 LAB — CBC
HCT: 27.5 % — ABNORMAL LOW (ref 36.0–46.0)
Hemoglobin: 8.3 g/dL — ABNORMAL LOW (ref 12.0–15.0)
MCH: 28.9 pg (ref 26.0–34.0)
MCHC: 30.2 g/dL (ref 30.0–36.0)
MCV: 95.8 fL (ref 78.0–100.0)
PLATELETS: 171 10*3/uL (ref 150–400)
RBC: 2.87 MIL/uL — ABNORMAL LOW (ref 3.87–5.11)
RDW: 20.1 % — AB (ref 11.5–15.5)
WBC: 3.7 10*3/uL — AB (ref 4.0–10.5)

## 2017-01-04 LAB — GLUCOSE, CAPILLARY
GLUCOSE-CAPILLARY: 253 mg/dL — AB (ref 65–99)
GLUCOSE-CAPILLARY: 169 mg/dL — AB (ref 65–99)
Glucose-Capillary: 147 mg/dL — ABNORMAL HIGH (ref 65–99)
Glucose-Capillary: 181 mg/dL — ABNORMAL HIGH (ref 65–99)

## 2017-01-04 LAB — PROTIME-INR
INR: 2.82
PROTHROMBIN TIME: 29.5 s — AB (ref 11.4–15.2)

## 2017-01-04 MED ORDER — FUROSEMIDE 10 MG/ML IJ SOLN
120.0000 mg | Freq: Two times a day (BID) | INTRAVENOUS | Status: DC
  Administered 2017-01-04 – 2017-01-06 (×4): 120 mg via INTRAVENOUS
  Filled 2017-01-04: qty 12
  Filled 2017-01-04 (×4): qty 10

## 2017-01-04 MED ORDER — WARFARIN SODIUM 2 MG PO TABS
1.0000 mg | ORAL_TABLET | Freq: Once | ORAL | Status: AC
  Administered 2017-01-04: 1 mg via ORAL
  Filled 2017-01-04: qty 0.5

## 2017-01-04 NOTE — Progress Notes (Signed)
PROGRESS NOTE    [name]  FAO:[id number] DOB: January 28, 1946 DOA: 12/23/2016 PCP: [name], MD   Brief Narrative: [name] is a 71 y.o. female with medical history significant for insulin-dependent diabetes mellitus, chronic kidney disease stage IV, chronic combined systolic/diastolic CHF, chronic atrial fibrillation, and coronary artery disease, now presenting into the emergency department at the direction of her nephrologist for evaluation of swelling and fatigue. Patient has been followed by cardiology and nephrology with worsening fluid status complicated by worsening kidney function. She has recently had her diuretics increased, but continues to be significantly up from her usual weight and her kidney function has worsened significantly over the past month or so. Patient denies any chest pain. She denies dyspnea while at rest. There has not been any fevers, chills, or significant cough. She reports fatigue, worsening progressively over the past couple months.   Admitted with acute on chronic diastolic heart failure secondary to Acute on chronic renal failure.  She was started lasix, metolazone. Renal function fluctuates,. Nephrology following. They are considering start HD if renal function doesn't improved.    Assessment & Plan:   Principal Problem:   CKD (chronic kidney disease), stage IV (HCC) Active Problems:   Essential hypertension   Coronary artery disease   Acute on chronic combined systolic and diastolic CHF (congestive heart failure) (HCC)   Elevated troponin   DM type 2 causing vascular disease (HCC)   Pleural effusion on right   Anemia in chronic kidney disease   Atrial fibrillation (HCC)   Hypokalemia   Hypervolemia   Rhonchi   Acute on chronic combined systolic/diastolic CHF  Echo updated last month, EF 69-62%, grade 2 diastolic dysfunction, mild MR, severe LAE Nephrology helping with diuresis and have changed oral Lasix to IV Lasix.  -4.6 L  since admission Weight 211----203--198--200--185--199. Weight unchanged compared to yesterday. Holding  Metolazone due to increase cr.   Acute kidney injury superimposed on CKD stage IV  Underwent AV graft placement 10-26. Possible mild steal post op.  Nephrology following. Indicate need for more aggressive diuresis and have converted to IV Lasix and indicated that if creatinine doesn't turn around, she will need dialysis. Patient is struggling with her decision because her husband recently diagnosed with stage IV cancer.  Paroxysmal A fib;   Amiodarone and metoprolol, currently in sinus rhythm. Continue with  coumadin.  INR at 2.8.  CAD,  Plavix, metoprolol/  Discussed with cardiology. Continue with plavix.   Diabetes; type 2;  CBGs uncontrolled and fluctuating. Monitor and may need adjustment.  Hypokalemia Replaced  Anemia Stable. Status post 1 dose of Feraheme.  Essential hypertension Controlled   DVT prophylaxis: heparin  Code Status: full code.  Family Communication: patient  Disposition Plan: SNF when clear by nephrology    Consultants:   nephrology   Cardiology   Procedures: none   Antimicrobials: none   Subjective: She states that she is doing better. Reports urinating well but current pure wik apparently leaks and hence not accurately capturing urine output. Still has significant leg edema.   Objective: Vitals:   01/04/17 0503 01/04/17 0512 01/04/17 0832 01/04/17 1204  BP: (!) 112/46  (!) 111/43 (!) 117/46  Pulse: 86  65 64  Resp: 18  18 20   Temp: 98.3 F (36.8 C)   98.1 F (36.7 C)  TempSrc: Oral   Oral  SpO2: 97%  97% 100%  Weight:  90.6 kg (199 lb 12.8 oz)    Height:  Intake/Output Summary (Last 24 hours) at 01/04/17 1921 Last data filed at 01/04/17 1823  Gross per 24 hour  Intake              903 ml  Output             1100 ml  Net             -197 ml   Filed Weights   01/03/17 0541 01/03/17 1134 01/04/17 0512  Weight:  90.6 kg (199 lb 12.8 oz) 90.6 kg (199 lb 11.2 oz) 90.6 kg (199 lb 12.8 oz)    Examination:  General exam; pleasant elderly female, sitting up comfortably in chair this morning. Respiratory system: CTA. No increased work of breathing. Cardiovascular system: S 1, S 2 RRR. No JVD or murmur. 2+ pitting bilateral leg edema. Telemetry: Sinus rhythm. Gastrointestinal system:  BS present, soft, nt Central nervous system: non focal. Alert and oriented 3. Extremities: symmetric power. Skin: no rash    Data Reviewed: I have personally reviewed following labs and imaging studies  CBC:  Recent Labs Lab 12/31/16 0804 01/01/17 0436 01/02/17 0535 01/03/17 0606 01/04/17 0454  WBC 4.4 3.9* 3.4* 4.2 3.7*  HGB 9.2* 8.2* 8.5* 8.5* 8.3*  HCT 31.9* 27.6* 28.5* 28.5* 27.5*  MCV 99.7 97.9 96.9 96.3 95.8  PLT 225 201 203 185 366   Basic Metabolic Panel:  Recent Labs Lab 12/31/16 0804 01/01/17 0436 01/02/17 0535 01/03/17 0606 01/04/17 0454  NA 135 133* 134* 132* 133*  K 5.4* 4.3 3.9 3.3* 3.5  CL 95* 94* 94* 93* 94*  CO2 27 26 29 27 27   GLUCOSE 266* 245* 195* 201* 278*  BUN 68* 75* 77* 82* 84*  CREATININE 3.50* 4.06* 4.01* 4.16* 4.22*  CALCIUM 9.3 9.1 9.2 9.1 8.9  PHOS 4.1 4.1 3.9 4.1 4.2   GFR: Estimated Creatinine Clearance: 14.2 mL/min (A) (by C-G formula based on SCr of 4.22 mg/dL (H)). Liver Function Tests:  Recent Labs Lab 12/31/16 0804 01/01/17 0436 01/02/17 0535 01/03/17 0606 01/04/17 0454  ALBUMIN 2.9* 2.5* 2.6* 2.5* 2.5*   No results for input(s): LIPASE, AMYLASE in the last 168 hours. No results for input(s): AMMONIA in the last 168 hours. Coagulation Profile:  Recent Labs Lab 12/31/16 0804 01/01/17 0436 01/02/17 0535 01/03/17 0606 01/04/17 0454  INR 1.62 2.01 2.55 2.62 2.82   Cardiac Enzymes: No results for input(s): CKTOTAL, CKMB, CKMBINDEX, TROPONINI in the last 168 hours. BNP (last 3 results) No results for input(s): PROBNP in the last 8760  hours. HbA1C: No results for input(s): HGBA1C in the last 72 hours. CBG:  Recent Labs Lab 01/03/17 1708 01/03/17 2112 01/04/17 0806 01/04/17 1159 01/04/17 1618  GLUCAP 318* 303* 253* 169* 147*   Lipid Profile: No results for input(s): CHOL, HDL, LDLCALC, TRIG, CHOLHDL, LDLDIRECT in the last 72 hours. Thyroid Function Tests: No results for input(s): TSH, T4TOTAL, FREET4, T3FREE, THYROIDAB in the last 72 hours. Anemia Panel: No results for input(s): VITAMINB12, FOLATE, FERRITIN, TIBC, IRON, RETICCTPCT in the last 72 hours. Sepsis Labs: No results for input(s): PROCALCITON, LATICACIDVEN in the last 168 hours.  Recent Results (from the past 240 hour(s))  Surgical pcr screen     Status: None   Collection Time: 12/28/16  7:26 PM  Result Value Ref Range Status   MRSA, PCR NEGATIVE NEGATIVE Final   Staphylococcus aureus NEGATIVE NEGATIVE Final    Comment: (NOTE) The Xpert SA Assay (FDA approved for NASAL specimens in patients 37 years of age  and older), is one component of a comprehensive surveillance program. It is not intended to diagnose infection nor to guide or monitor treatment.          Radiology Studies: No results found.      Scheduled Meds: . ALPRAZolam  0.5 mg Oral QHS  . amiodarone  200 mg Oral Daily  . calcitRIOL  0.5 mcg Oral Daily  . clopidogrel  75 mg Oral Daily  . cyclobenzaprine  5 mg Oral QHS  . darbepoetin (ARANESP) injection - NON-DIALYSIS  150 mcg Subcutaneous Q Mon-1800  . fluticasone  1 spray Each Nare Daily  . insulin aspart  0-15 Units Subcutaneous TID WC  . insulin glargine  20 Units Subcutaneous QHS  . isosorbide mononitrate  30 mg Oral BID  . latanoprost  1 drop Both Eyes QHS  . loratadine  10 mg Oral Daily  . meclizine  12.5 mg Oral BID  . mouth rinse  15 mL Mouth Rinse BID  . metoprolol succinate  25 mg Oral QHS  . pantoprazole  40 mg Oral Daily  . rosuvastatin  40 mg Oral q1800  . senna-docusate  2 tablet Oral BID  . sodium  chloride flush  3 mL Intravenous Q12H  . warfarin  1 mg Oral ONCE-1800  . Warfarin - Pharmacist Dosing Inpatient   Does not apply q1800   Continuous Infusions: . sodium chloride    . sodium chloride 10 mL/hr at 12/30/16 1254  . furosemide Stopped (01/04/17 1823)     LOS: 12 days    Time spent: 35 minutes.     Vernell Leep, MD, FACP, FHM. [facility] Pager [phone]  If 7PM-7AM, please contact night-coverage [email or web address] Password [email or web address] 01/04/2017, 7:28 PM

## 2017-01-04 NOTE — Progress Notes (Signed)
S:71 year old lady with a history of HFrEF, CKD, P Afib.Admitted for volume overload and worsening renal function.  Complaints today, patient appears depressed, found out yesterday that her husband was diagnosed with stage IV gallbladder cancer. Creatinine continued to get worse, and patient do not want to think about dialysis under current circumstances, already had fistula placed 1 week ago. We had a long discussion about problems and cons of starting dialysis, as we think she might need dialysis during current hospitalization if her renal function continued to get worse. Patient will inform us regarding her decision.   O:BP (!) 117/46 (BP Location: Right Arm)   Pulse 64   Temp 98.1 F (36.7 C) (Oral)   Resp 20   Ht 5\' 8"  (1.727 m)   Wt 199 lb 12.8 oz (90.6 kg) Comment: scale c  SpO2 100%   BMI 30.38 kg/m   Intake/Output Summary (Last 24 hours) at 01/04/17 1410 Last data filed at 01/04/17 1349  Gross per 24 hour  Intake             1081 ml  Output             1100 ml  Net              -19 ml   Intake/Output: I/O last 3 completed shifts: In: 951 [P.O.:838; I.V.:3] Out: 1500 [Urine:1500]  Intake/Output this shift:  Total I/O In: 720 [P.O.:720] Out: 500 [Urine:500] Weight change: -1.6 oz (-0.045 kg)  Gen: well-developed lady, in no acute distress. CVS: Regular rate and rhythm  Resp: bilaterally decreased breath sounds at bases,with few basal crackles. Abd: soft, non tender, bowel sounds positive. Ext: 3+ lower extremity edema, left brachiocephalic fistula with good bruit.   Recent Labs Lab 12/29/16 0435 12/30/16 0530 12/31/16 0804 01/01/17 0436 01/02/17 0535 01/03/17 0606 01/04/17 0454  NA 139 139 135 133* 134* 132* 133*  K 4.0 3.7 5.4* 4.3 3.9 3.3* 3.5  CL 99* 98* 95* 94* 94* 93* 94*  CO2 29 30 27 26 29 27 27   GLUCOSE 203* 145* 266* 245* 195* 201* 278*  BUN 71* 67* 68* 75* 77* 82* 84*  CREATININE 2.89* 2.86* 3.50* 4.06* 4.01* 4.16* 4.22*  ALBUMIN  --   --   2.9* 2.5* 2.6* 2.5* 2.5*  CALCIUM 9.4 9.4 9.3 9.1 9.2 9.1 8.9  PHOS  --   --  4.1 4.1 3.9 4.1 4.2   Liver Function Tests:  Recent Labs Lab 01/02/17 0535 01/03/17 0606 01/04/17 0454  ALBUMIN 2.6* 2.5* 2.5*   No results for input(s): LIPASE, AMYLASE in the last 168 hours. No results for input(s): AMMONIA in the last 168 hours. CBC:  Recent Labs Lab 12/31/16 0804 01/01/17 0436 01/02/17 0535 01/03/17 0606 01/04/17 0454  WBC 4.4 3.9* 3.4* 4.2 3.7*  HGB 9.2* 8.2* 8.5* 8.5* 8.3*  HCT 31.9* 27.6* 28.5* 28.5* 27.5*  MCV 99.7 97.9 96.9 96.3 95.8  PLT 225 201 203 185 171   Cardiac Enzymes: No results for input(s): CKTOTAL, CKMB, CKMBINDEX, TROPONINI in the last 168 hours. CBG:  Recent Labs Lab 01/03/17 1137 01/03/17 1708 01/03/17 2112 01/04/17 0806 01/04/17 1159  GLUCAP 245* 318* 303* 253* 169*    Iron Studies: No results for input(s): IRON, TIBC, TRANSFERRIN, FERRITIN in the last 72 hours. Studies/Results: No results found. . ALPRAZolam  0.5 mg Oral QHS  . amiodarone  200 mg Oral Daily  . calcitRIOL  0.5 mcg Oral Daily  . clopidogrel  75 mg Oral Daily  .  cyclobenzaprine  5 mg Oral QHS  . darbepoetin (ARANESP) injection - NON-DIALYSIS  150 mcg Subcutaneous Q Mon-1800  . fluticasone  1 spray Each Nare Daily  . insulin aspart  0-15 Units Subcutaneous TID WC  . insulin glargine  20 Units Subcutaneous QHS  . isosorbide mononitrate  30 mg Oral BID  . latanoprost  1 drop Both Eyes QHS  . loratadine  10 mg Oral Daily  . meclizine  12.5 mg Oral BID  . mouth rinse  15 mL Mouth Rinse BID  . metoprolol succinate  25 mg Oral QHS  . pantoprazole  40 mg Oral Daily  . rosuvastatin  40 mg Oral q1800  . senna-docusate  2 tablet Oral BID  . sodium chloride flush  3 mL Intravenous Q12H  . warfarin  1 mg Oral ONCE-1800  . Warfarin - Pharmacist Dosing Inpatient   Does not apply q1800    BMET    Component Value Date/Time   NA 133 (L) 01/04/2017 0454   NA 141 02/18/2016 0937    K 3.5 01/04/2017 0454   CL 94 (L) 01/04/2017 0454   CO2 27 01/04/2017 0454   GLUCOSE 278 (H) 01/04/2017 0454   BUN 84 (H) 01/04/2017 0454   BUN 53 (H) 02/18/2016 0937   CREATININE 4.22 (H) 01/04/2017 0454   CREATININE 1.73 (H) 10/08/2013 1024   CALCIUM 8.9 01/04/2017 0454   GFRNONAA 10 (L) 01/04/2017 0454   GFRAA 11 (L) 01/04/2017 0454   CBC    Component Value Date/Time   WBC 3.7 (L) 01/04/2017 0454   RBC 2.87 (L) 01/04/2017 0454   HGB 8.3 (L) 01/04/2017 0454   HCT 27.5 (L) 01/04/2017 0454   PLT 171 01/04/2017 0454   MCV 95.8 01/04/2017 0454   MCH 28.9 01/04/2017 0454   MCHC 30.2 01/04/2017 0454   RDW 20.1 (H) 01/04/2017 0454   LYMPHSABS 0.8 12/27/2016 0524   MONOABS 0.4 12/27/2016 0524   EOSABS 0.1 12/27/2016 0524   BASOSABS 0.0 12/27/2016 0524     Assessment/Plan:  AKI with CKD.Creatinine continue to rise. Her urinary output recording is not a current as patient being incontinent. Patient is currently under very emotional distress because of the new diagnosis of stage IV gallbladder cancer in her Husband.  She is approaching dialysis, She does not want to start dialysis under current circumstances,will need some time to make decisions. -this patient and agrees for dialysis we will ask vascular surgery to put tunneled catheter. -Change her by mouth Lasix to IV Lasix 120 mg twice a day. -strict intake and output with daily weights. -Continue monitoring renal function.  Mild hypokalemia. Resolved today. -Keep monitoring electrolytes and replete as needed.  HFrEF. Cardiology is following,remained volume overloaded. -Continue diuresis.  Anemia. Hemoglobin stable,  had 1 dose of Feraheme. -continue monitoring and Aranesp 150 per week.  Hypertension. Blood pressure little soft today. -keep monitoring and avoid hypotension.  Afib. Rate controlled with amiodarone and beta blocker.   DM. Controlled.  [name]

## 2017-01-04 NOTE — Progress Notes (Signed)
Physical Therapy Treatment Patient Details Name: [name] MRN: [id number] DOB: [birth year] Today's Date: 01/04/2017    History of Present Illness 71 y.o. female PMH of  CAD (s/p NSTEMI in 06/2015 with DES to PDA and RCA at that time, subtotal occlusion of LCX treated medically; repeat cath 11/2015 with stable disease; inferolateral scar w/o ischemia), chronic combined systolic and diastolic CHF (EF 35-59% by echo in 11/2016), paroxysmal atrial fibrillation, right subclavian stenosis (s/p intervention), IDDM, HTN, HLD, and CKD Stage IV admitted on 12/23/2016 for worsening edema and fatigue. Pt being treated for acute on chronic combined systolic and diastolic CHF, acute on chronic Stage 4 CKD, paroxysmal A-fib, CAD, anemia, hypoprothrobinemia, and IDDM.     PT Comments    Complained of legs burning from medication that nurse applied to wound and stated that her legs burned more when walking. Min assist for safety awareness due to pt eagerness.  Pt able to complete LE exercises; min cues for slow controlled movements. Continuing to show progress. Work on LE strengthening and sit-to-stands (emphasize safety) next session.  Follow Up Recommendations  SNF     Equipment Recommendations  None recommended by PT    Recommendations for Other Services       Precautions / Restrictions Precautions Precautions: Fall Restrictions Weight Bearing Restrictions: No    Mobility  Bed Mobility               General bed mobility comments: pt OOB in chair upon arrival  Transfers Overall transfer level: Needs assistance Equipment used: Rolling walker (2 wheeled) Transfers: Sit to/from Stand Sit to Stand: Mod assist         General transfer comment: assist to power up into standing; 2 attempts; cues for positioning and safe hand placement  Ambulation/Gait Ambulation/Gait assistance: Min assist Ambulation Distance (Feet): 20 Feet Assistive device: Rolling walker (2 wheeled) Gait  Pattern/deviations: Step-through pattern;Decreased step length - right;Decreased step length - left;Trunk flexed Gait velocity: decreased   General Gait Details: cues for posture and to bring walker closer; Pt eager to finish walking   Stairs            Wheelchair Mobility    Modified Rankin (Stroke Patients Only)       Balance Overall balance assessment: Needs assistance Sitting-balance support: No upper extremity supported;Feet supported Sitting balance-[name]: Good Sitting balance - Comments: able to sit up during LE seated exercises   Standing balance support: Bilateral upper extremity supported Standing balance-[name]: Poor Standing balance comment: requires UE support with RW for maintaining balance                            Cognition Arousal/Alertness: Awake/alert Behavior During Therapy: WFL for tasks assessed/performed Overall Cognitive Status: Within Functional Limits for tasks assessed                                        Exercises General Exercises - Lower Extremity Ankle Circles/Pumps: AROM;Both;10 reps Gluteal Sets: AROM;15 reps;Supine Long Arc Quad: AROM;Both;10 reps;Seated    General Comments        Pertinent Vitals/Pain Pain Score: 6  Pain Location: L leg Pain Descriptors / Indicators: Burning    Home Living                      Prior Function  PT Goals (current goals can now be found in the care plan section) Acute Rehab PT Goals Potential to Achieve Goals: Fair Progress towards PT goals: Progressing toward goals    Frequency    Min 3X/week      PT Plan Current plan remains appropriate    Co-evaluation              AM-PAC PT "6 Clicks" Daily Activity  Outcome Measure  Difficulty turning over in bed (including adjusting bedclothes, sheets and blankets)?: A Little Difficulty moving from lying on back to sitting on the side of the bed? : A Lot Difficulty  sitting down on and standing up from a chair with arms (e.g., wheelchair, bedside commode, etc,.)?: Unable Help needed moving to and from a bed to chair (including a wheelchair)?: A Lot Help needed walking in hospital room?: A Lot Help needed climbing 3-5 steps with a railing? : Total 6 Click Score: 11    End of Session Equipment Utilized During Treatment: Gait belt Activity Tolerance: Patient tolerated treatment well Patient left: in chair;with call bell/phone within reach Nurse Communication: Mobility status PT Visit Diagnosis: Unsteadiness on feet (R26.81);Other abnormalities of gait and mobility (R26.89);Muscle weakness (generalized) (M62.81)     Time: 9675-9163 PT Time Calculation (min) (ACUTE ONLY): 25 min  Charges:  $Therapeutic Exercise: 8-22 mins $Therapeutic Activity: 8-22 mins                    G Codes:  Functional Assessment Tool Used: AM-PAC 6 Clicks Basic Mobility    Fransisca Connors, SPTA    Fransisca Connors 01/04/2017, 4:27 PM

## 2017-01-04 NOTE — Progress Notes (Signed)
Progress Note  Patient Name: [name] Date of Encounter: 01/04/2017  Primary Cardiologist:  Blanch Media   Subjective   71 year old female with a history of chronic diastolic congestive heart failure, progressive chronic kidney disease, paroxysmal atrial fibrillation, coronary artery disease.  She was admitted with worsening congestive heart failure.  She has diuresed nicely.  She has a net output of -4.8 L so far this admission.  Creatinine is 4.22 this morning.  Inpatient Medications    Scheduled Meds: . ALPRAZolam  0.5 mg Oral QHS  . amiodarone  200 mg Oral Daily  . calcitRIOL  0.5 mcg Oral Daily  . clopidogrel  75 mg Oral Daily  . cyclobenzaprine  5 mg Oral QHS  . darbepoetin (ARANESP) injection - NON-DIALYSIS  150 mcg Subcutaneous Q Mon-1800  . fluticasone  1 spray Each Nare Daily  . furosemide  120 mg Oral BID  . insulin aspart  0-15 Units Subcutaneous TID WC  . insulin glargine  20 Units Subcutaneous QHS  . isosorbide mononitrate  30 mg Oral BID  . latanoprost  1 drop Both Eyes QHS  . loratadine  10 mg Oral Daily  . meclizine  12.5 mg Oral BID  . mouth rinse  15 mL Mouth Rinse BID  . metoprolol succinate  25 mg Oral QHS  . pantoprazole  40 mg Oral Daily  . rosuvastatin  40 mg Oral q1800  . senna-docusate  2 tablet Oral BID  . sodium chloride flush  3 mL Intravenous Q12H  . warfarin  1 mg Oral ONCE-1800  . Warfarin - Pharmacist Dosing Inpatient   Does not apply q1800   Continuous Infusions: . sodium chloride    . sodium chloride 10 mL/hr at 12/30/16 1254   PRN Meds: sodium chloride, acetaminophen, albuterol, diazepam, ondansetron (ZOFRAN) IV, sodium chloride flush   Vital Signs    Vitals:   01/03/17 1921 01/04/17 0503 01/04/17 0512 01/04/17 0832  BP: (!) 103/49 (!) 112/46  (!) 111/43  Pulse: 63 86  65  Resp: 18 18  18   Temp: 98.1 F (36.7 C) 98.3 F (36.8 C)    TempSrc: Oral Oral    SpO2: 99% 97%  97%  Weight:   199 lb 12.8 oz (90.6  kg)   Height:        Intake/Output Summary (Last 24 hours) at 01/04/17 0948 Last data filed at 01/04/17 0917  Gross per 24 hour  Intake             1201 ml  Output              600 ml  Net              601 ml   Filed Weights   01/03/17 0541 01/03/17 1134 01/04/17 0512  Weight: 199 lb 12.8 oz (90.6 kg) 199 lb 11.2 oz (90.6 kg) 199 lb 12.8 oz (90.6 kg)    Telemetry    NSR  - Personally Reviewed  ECG     NSR  - Personally Reviewed  Physical Exam   GEN: No acute distress.   Neck: No JVD Cardiac: RRR,  soft systolic murmur   Respiratory: Clear to auscultation bilaterally. GI: Soft, nontender, non-distended  MS: No edema; No deformity.  Dialysis fistula in left upper arm  Neuro:  Nonfocal  Psych: Normal affect   Labs    Chemistry Recent Labs Lab 01/02/17 0535 01/03/17 0606 01/04/17 0454  NA 134* 132* 133*  K 3.9 3.3*  3.5  CL 94* 93* 94*  CO2 29 27 27   GLUCOSE 195* 201* 278*  BUN 77* 82* 84*  CREATININE 4.01* 4.16* 4.22*  CALCIUM 9.2 9.1 8.9  ALBUMIN 2.6* 2.5* 2.5*  GFRNONAA 10* 10* 10*  GFRAA 12* 11* 11*  ANIONGAP 11 12 12      Hematology Recent Labs Lab 01/02/17 0535 01/03/17 0606 01/04/17 0454  WBC 3.4* 4.2 3.7*  RBC 2.94* 2.96* 2.87*  HGB 8.5* 8.5* 8.3*  HCT 28.5* 28.5* 27.5*  MCV 96.9 96.3 95.8  MCH 28.9 28.7 28.9  MCHC 29.8* 29.8* 30.2  RDW 20.4* 20.0* 20.1*  PLT 203 185 171    Cardiac EnzymesNo results for input(s): TROPONINI in the last 168 hours. No results for input(s): TROPIPOC in the last 168 hours.   BNPNo results for input(s): BNP, PROBNP in the last 168 hours.   DDimer No results for input(s): DDIMER in the last 168 hours.   Radiology    No results found.  Cardiac Studies      Patient Profile     71 y.o. female with progressive heart failure symptoms and progressive renal insufficiency.  Assessment & Plan    1.  Acute on chronic diastolic congestive heart failure.  He continues to be markedly volume overloaded.  She  is diuresing but this is at the expense of a rising creatinine.  She has a dialysis fistula in place.  It will still take several weeks for this to mature.  Continue current course.  Further plans per internal medicine and nephrology.  2.  Coronary artery disease: She has a subtotal occlusion of her left circumflex artery.  She denies any angina.  She is on Plavix.  3.  Paroxysmal atrial fibrillation: He is on amiodarone and metoprolol.  She has maintained sinus rhythm.  She is on Coumadin.   For questions or updates, please contact [place] Please consult [email or web address] for contact info under Cardiology/STEMI.      Signed, Mertie Moores, MD  01/04/2017, 9:48 AM

## 2017-01-04 NOTE — Progress Notes (Signed)
Inpatient Diabetes Program Recommendations  AACE/ADA: New Consensus Statement on Inpatient Glycemic Control (2015)  Target Ranges:  Prepandial:   less than 140 mg/dL      Peak postprandial:   less than 180 mg/dL (1-2 hours)      Critically ill patients:  140 - 180 mg/dL   Results for [name], [name] (MRN [id number]) as of 01/04/2017 09:24  Ref. Range 01/03/2017 07:50 01/03/2017 11:37 01/03/2017 17:08 01/03/2017 21:12  Glucose-Capillary Latest Ref Range: 65 - 99 mg/dL 190 (H) 245 (H) 318 (H) 303 (H)   Results for [name], [name] (MRN [id number]) as of 01/04/2017 09:24  Ref. Range 01/04/2017 08:06  Glucose-Capillary Latest Ref Range: 65 - 99 mg/dL 253 (H)    Home DM Meds: Lantus 40 units QHS       Novolog 10 units TID  Current Insulin Orders: Lantus 20 units QHS      Novolog Moderate Correction Scale/ SSI (0-15 units) TID AC      MD- Please consider the following in-hospital insulin adjustments:  1. Increase Lantus to 30 units QHS (75% total home dose)  2. Start Novolog Meal Coverage: Novolog 5 units TID with meals (hold if pt eats <50% of meal)       --Will follow patient during hospitalization--  Wyn Quaker RN, MSN, CDE Diabetes Coordinator Inpatient Glycemic Control Team Team Pager: [phone] (8a-5p)

## 2017-01-04 NOTE — Consult Note (Addendum)
[facility] Nurse wound consult note Reason for Consult: Consult requested for BLE Wound type: Pt previously developed swelling and blisters to BLE.  These have ruptured and evolved into full thickness wounds Measurement: Left posterior leg; 6X11X.1cm Right anterior leg 7X9X.1cm Wound bed: both are red and moist, mod amt yellow drainage, no odor Dressing procedure/placement/frequency: Xeroform gauze to promote drying and healing to BLE.   Please re-consult if further assistance is needed.  Thank-you,  Julien Girt MSN, [place], [place], [place], [place]

## 2017-01-04 NOTE — Progress Notes (Signed)
ANTICOAGULATION CONSULT NOTE - Follow Up Consult  Pharmacy Consult for Warfarin Indication: atrial fibrillation  Allergies  Allergen Reactions  . Ace Inhibitors Cough  . Amlodipine Swelling    UNSPECIFIED EDEMA   . Codeine Rash   Patient Measurements: Height: 5\' 8"  (172.7 cm) Weight: 199 lb 12.8 oz (90.6 kg) (scale c) IBW/kg (Calculated) : 63.9  Vital Signs: Temp: 98.3 F (36.8 C) (10/31 0503) Temp Source: Oral (10/31 0503) BP: 112/46 (10/31 0503) Pulse Rate: 86 (10/31 0503)  Labs:  Recent Labs  01/02/17 0535 01/03/17 0606 01/04/17 0454  HGB 8.5* 8.5* 8.3*  HCT 28.5* 28.5* 27.5*  PLT 203 185 171  LABPROT 27.3* 27.8* 29.5*  INR 2.55 2.62 2.82  CREATININE 4.01* 4.16* 4.22*    Estimated Creatinine Clearance: 14.2 mL/min (A) (by C-G formula based on SCr of 4.22 mg/dL (H)).   Assessment: 71 year old female to continue on warfarin for h/o atrial fibrillation. Recent aVF placement on 10/26. Heparin was stopped on 01/01/17 since INR  therapeutic.   INR today = 2.82  PTA warfarin regimen: 7.5mg  daily alternating with 3.75mg  (admit INR =4.38 on 12/23/16)   Goal of Therapy:  Heparin level 0.3-0.7 units/ml Monitor platelets by anticoagulation protocol: Yes   Plan:  Warfarin 1 mg PO x1 tonight Daily INR/CBC Monitor for s/sx of bleeding  Increase lantus?  Thank you Anette Guarneri, PharmD [phone] 01/04/2017 8:11 AM

## 2017-01-05 LAB — GLUCOSE, CAPILLARY
GLUCOSE-CAPILLARY: 221 mg/dL — AB (ref 65–99)
GLUCOSE-CAPILLARY: 212 mg/dL — AB (ref 65–99)
GLUCOSE-CAPILLARY: 295 mg/dL — AB (ref 65–99)
Glucose-Capillary: 204 mg/dL — ABNORMAL HIGH (ref 65–99)
Glucose-Capillary: 196 mg/dL — ABNORMAL HIGH (ref 65–99)

## 2017-01-05 LAB — CBC
HEMATOCRIT: 29 % — AB (ref 36.0–46.0)
Hemoglobin: 8.9 g/dL — ABNORMAL LOW (ref 12.0–15.0)
MCH: 29.4 pg (ref 26.0–34.0)
MCHC: 30.7 g/dL (ref 30.0–36.0)
MCV: 95.7 fL (ref 78.0–100.0)
Platelets: 193 10*3/uL (ref 150–400)
RBC: 3.03 MIL/uL — ABNORMAL LOW (ref 3.87–5.11)
RDW: 19.7 % — AB (ref 11.5–15.5)
WBC: 3.7 10*3/uL — ABNORMAL LOW (ref 4.0–10.5)

## 2017-01-05 LAB — RENAL FUNCTION PANEL
ALBUMIN: 2.6 g/dL — AB (ref 3.5–5.0)
Anion gap: 12 (ref 5–15)
BUN: 82 mg/dL — AB (ref 6–20)
CO2: 29 mmol/L (ref 22–32)
Calcium: 9.1 mg/dL (ref 8.9–10.3)
Chloride: 93 mmol/L — ABNORMAL LOW (ref 101–111)
Creatinine, Ser: 3.99 mg/dL — ABNORMAL HIGH (ref 0.44–1.00)
GFR calc Af Amer: 12 mL/min — ABNORMAL LOW (ref >60)
GFR calc non Af Amer: 10 mL/min — ABNORMAL LOW (ref >60)
GLUCOSE: 191 mg/dL — AB (ref 65–99)
PHOSPHORUS: 3.8 mg/dL (ref 2.5–4.6)
POTASSIUM: 3.2 mmol/L — AB (ref 3.5–5.1)
Sodium: 134 mmol/L — ABNORMAL LOW (ref 135–145)

## 2017-01-05 LAB — PROTIME-INR
INR: 2.82
PROTHROMBIN TIME: 29.4 s — AB (ref 11.4–15.2)

## 2017-01-05 MED ORDER — FENTANYL CITRATE (PF) 100 MCG/2ML IJ SOLN: 25.0000 ug | INTRAMUSCULAR | Status: DC | PRN

## 2017-01-05 MED ORDER — WARFARIN SODIUM 2 MG PO TABS
2.0000 mg | ORAL_TABLET | Freq: Once | ORAL | Status: AC
  Administered 2017-01-05: 2 mg via ORAL
  Filled 2017-01-05: qty 1

## 2017-01-05 MED ORDER — METOCLOPRAMIDE HCL 5 MG/ML IJ SOLN
10.0000 mg | Freq: Once | INTRAMUSCULAR | Status: DC | PRN
Start: 2017-01-05 — End: 2017-01-06

## 2017-01-05 MED ORDER — POTASSIUM CHLORIDE CRYS ER 20 MEQ PO TBCR
40.0000 meq | EXTENDED_RELEASE_TABLET | Freq: Once | ORAL | Status: AC
  Administered 2017-01-05: 40 meq via ORAL
  Filled 2017-01-05: qty 2

## 2017-01-05 NOTE — Progress Notes (Signed)
S: Cr improved, diuresis good with IV Lasix.   O:BP 108/62 (BP Location: Right Arm)   Pulse 63   Temp 98.4 F (36.9 C) (Oral)   Resp 20   Ht 5\' 8"  (1.727 m)   Wt 89.9 kg (198 lb 4.8 oz)   SpO2 100%   BMI 30.15 kg/m   Intake/Output Summary (Last 24 hours) at 01/05/17 1255 Last data filed at 01/05/17 1231  Gross per 24 hour  Intake              902 ml  Output             1600 ml  Net             -698 ml   Intake/Output: I/O last 3 completed shifts: In: 2836 [P.O.:1318; I.V.:3; IV Piggyback:62] Out: 2200 [Urine:2200]  Intake/Output this shift:  Total I/O In: -  Out: 300 [Urine:300] Weight change: -0.635 kg (-1 lb 6.4 oz)  Gen: NAD CVS: Regular rate and rhythm  Resp: bilaterally decreased breath sounds at bases,with few basal crackles. Abd: soft, non tender, bowel sounds positive. Ext: 2+ lower extremity edema, left AVG with good bruit.   Recent Labs Lab 12/30/16 0530 12/31/16 0804 01/01/17 0436 01/02/17 0535 01/03/17 0606 01/04/17 0454 01/05/17 0615  NA 139 135 133* 134* 132* 133* 134*  K 3.7 5.4* 4.3 3.9 3.3* 3.5 3.2*  CL 98* 95* 94* 94* 93* 94* 93*  CO2 30 27 26 29 27 27 29   GLUCOSE 145* 266* 245* 195* 201* 278* 191*  BUN 67* 68* 75* 77* 82* 84* 82*  CREATININE 2.86* 3.50* 4.06* 4.01* 4.16* 4.22* 3.99*  ALBUMIN  --  2.9* 2.5* 2.6* 2.5* 2.5* 2.6*  CALCIUM 9.4 9.3 9.1 9.2 9.1 8.9 9.1  PHOS  --  4.1 4.1 3.9 4.1 4.2 3.8   Liver Function Tests:  Recent Labs Lab 01/03/17 0606 01/04/17 0454 01/05/17 0615  ALBUMIN 2.5* 2.5* 2.6*   No results for input(s): LIPASE, AMYLASE in the last 168 hours. No results for input(s): AMMONIA in the last 168 hours. CBC:  Recent Labs Lab 01/01/17 0436 01/02/17 0535 01/03/17 0606 01/04/17 0454 01/05/17 0615  WBC 3.9* 3.4* 4.2 3.7* 3.7*  HGB 8.2* 8.5* 8.5* 8.3* 8.9*  HCT 27.6* 28.5* 28.5* 27.5* 29.0*  MCV 97.9 96.9 96.3 95.8 95.7  PLT 201 203 185 171 193   Cardiac Enzymes: No results for input(s): CKTOTAL,  CKMB, CKMBINDEX, TROPONINI in the last 168 hours. CBG:  Recent Labs Lab 01/04/17 1618 01/04/17 2002 01/05/17 0355 01/05/17 0752 01/05/17 1123  GLUCAP 147* 181* 204* 196* 221*    Iron Studies: No results for input(s): IRON, TIBC, TRANSFERRIN, FERRITIN in the last 72 hours. Studies/Results: No results found. . ALPRAZolam  0.5 mg Oral QHS  . amiodarone  200 mg Oral Daily  . calcitRIOL  0.5 mcg Oral Daily  . clopidogrel  75 mg Oral Daily  . cyclobenzaprine  5 mg Oral QHS  . darbepoetin (ARANESP) injection - NON-DIALYSIS  150 mcg Subcutaneous Q Mon-1800  . fluticasone  1 spray Each Nare Daily  . insulin aspart  0-15 Units Subcutaneous TID WC  . insulin glargine  20 Units Subcutaneous QHS  . isosorbide mononitrate  30 mg Oral BID  . latanoprost  1 drop Both Eyes QHS  . loratadine  10 mg Oral Daily  . meclizine  12.5 mg Oral BID  . mouth rinse  15 mL Mouth Rinse BID  . metoprolol succinate  25 mg  Oral QHS  . pantoprazole  40 mg Oral Daily  . rosuvastatin  40 mg Oral q1800  . senna-docusate  2 tablet Oral BID  . sodium chloride flush  3 mL Intravenous Q12H  . warfarin  2 mg Oral ONCE-1800  . Warfarin - Pharmacist Dosing Inpatient   Does not apply q1800    BMET    Component Value Date/Time   NA 134 (L) 01/05/2017 0615   NA 141 02/18/2016 0937   K 3.2 (L) 01/05/2017 0615   CL 93 (L) 01/05/2017 0615   CO2 29 01/05/2017 0615   GLUCOSE 191 (H) 01/05/2017 0615   BUN 82 (H) 01/05/2017 0615   BUN 53 (H) 02/18/2016 0937   CREATININE 3.99 (H) 01/05/2017 0615   CREATININE 1.73 (H) 10/08/2013 1024   CALCIUM 9.1 01/05/2017 0615   GFRNONAA 10 (L) 01/05/2017 0615   GFRAA 12 (L) 01/05/2017 0615   CBC    Component Value Date/Time   WBC 3.7 (L) 01/05/2017 0615   RBC 3.03 (L) 01/05/2017 0615   HGB 8.9 (L) 01/05/2017 0615   HCT 29.0 (L) 01/05/2017 0615   PLT 193 01/05/2017 0615   MCV 95.7 01/05/2017 0615   MCH 29.4 01/05/2017 0615   MCHC 30.7 01/05/2017 0615   RDW 19.7 (H)  01/05/2017 0615   LYMPHSABS 0.8 12/27/2016 0524   MONOABS 0.4 12/27/2016 0524   EOSABS 0.1 12/27/2016 0524   BASOSABS 0.0 12/27/2016 0524     Assessment/Plan:  AKI with CKD.Continue diuresis with Lasix 120 mg IV BID.  Hopefully we won't need to start HD so she can focus on her husband who just got diagnosed with stage IV cancer.  HFrEF. Cardiology is following,remained volume overloaded.  Anemia. Hemoglobin stable,  had 1 dose of Feraheme. - Aranesp 150 per week.  Hypertension. Blood pressure little soft today. -keep monitoring and avoid hypotension.  Afib. Rate controlled with amiodarone and beta blocker, on warfarin DM. per primary  Madelon Lips MD [place] pgr [phone]

## 2017-01-05 NOTE — Progress Notes (Signed)
PROGRESS NOTE    [name]  IOE:[id number] DOB: February 25, 1946 DOA: 12/23/2016 PCP: [name], MD   Brief Narrative: KIEANNA [name] is a 71 y.o. female with medical history significant for insulin-dependent diabetes mellitus, chronic kidney disease stage IV, chronic combined systolic/diastolic CHF, chronic atrial fibrillation, and coronary artery disease, presented to the ED at the direction of her nephrologist for evaluation of swelling and fatigue. She has been followed by cardiology and nephrology for worsening fluid status complicating worsening kidney function. She continued to be volume overloaded despite increase in her diuretics recently. Admitted with acute on chronic diastolic heart failure secondary to Acute on chronic renal failure.  Nephrology and cardiology following. Nephrology adjusting diuretics and hoping to avoid initiating hemodialysis.   Assessment & Plan:   Principal Problem:   CKD (chronic kidney disease), stage IV (HCC) Active Problems:   Essential hypertension   Coronary artery disease   Acute on chronic combined systolic and diastolic CHF (congestive heart failure) (HCC)   Elevated troponin   DM type 2 causing vascular disease (HCC)   Pleural effusion on right   Anemia in chronic kidney disease   Atrial fibrillation (HCC)   Hypokalemia   Hypervolemia   Rhonchi   Acute on chronic combined systolic/diastolic CHF  Echo 03/15/27, EF 93-71%, grade 2 diastolic dysfunction, mild MR, severe LAE Nephrology helping with diuresis and on 10/31 have changed oral Lasix to IV Lasix 120 mg twice a day.  -5.8 L since admission. 1.6 L urine output documented yesterday but patient repeatedly states that not all of her urine output is being charted in EMR Weight down from 211 pounds on 10/19-198 pounds on 11/1. Holding Metolazone due to increase cr.   Acute kidney injury superimposed on CKD stage IV  Underwent AV graft placement 10-26. Possible mild steal post op.   Nephrology following. She was started on high-dose IV Lasix 120 mg twice a day on 10/31 with improved urine output and improvement in creatinine from 4.22 to 3.99. Nephrology hoping to avoid initiating HD this admission so that patient can focus on her husband was just diagnosed with stage IV cancer.  Paroxysmal A fib;   - Remains in sinus rhythm. Cardiology following. Continue amiodarone, metoprolol, Plavix and anticoagulated on Coumadin.  CAD,  Asymptomatic of chest pain. Continue Plavix, Imdur, metoprolol and statins.  Diabetes; type 2;  CBG is uncontrolled and mildly fluctuating. Currently on Lantus 20 units at bedtime and NovoLog SSI, moderate sensitivity. Continue current regimen without change.  Hypokalemia Replace aggressively and follow.  Anemia Stable. Status post 1 dose of Feraheme.  Essential hypertension Controlled   DVT prophylaxis: Anticoagulated on Coumadin Code Status: Full code Family Communication: None at bedside. Disposition Plan: DC to? SNF when medically improved and stable.   Consultants:   Nephrology   Cardiology   Procedures:  None   Antimicrobials:  None   Subjective: Reports ongoing good urine output but also repeatedly states that not all of her urine output is being charted. Denies dyspnea. Leg swelling improving. No chest pain reported. Denies dizziness or lightheadedness.   Objective: Vitals:   01/04/17 1204 01/04/17 2133 01/04/17 2318 01/05/17 0638  BP: (!) 117/46 (!) 121/44 (!) 109/51 108/62  Pulse: 64 65 77 63  Resp: 20 18 18 20   Temp: 98.1 F (36.7 C) 98.2 F (36.8 C) 98.6 F (37 C) 98.4 F (36.9 C)  TempSrc: Oral Oral Oral Oral  SpO2: 100% 98% 100% 100%  Weight:    89.9 kg (  198 lb 4.8 oz)  Height:        Intake/Output Summary (Last 24 hours) at 01/05/17 1509 Last data filed at 01/05/17 1231  Gross per 24 hour  Intake              542 ml  Output             1400 ml  Net             -858 ml   Filed Weights    01/03/17 1134 01/04/17 0512 01/05/17 5170  Weight: 90.6 kg (199 lb 11.2 oz) 90.6 kg (199 lb 12.8 oz) 89.9 kg (198 lb 4.8 oz)    Examination:  General exam; pleasant elderly female, sitting up comfortably at edge of bed this morning. Respiratory system: Occasional basal crackles but otherwise clear to auscultation. No increased work of breathing. Cardiovascular system: S1 and S2 heard, RRR. No JVD or murmurs. 2+ pitting bilateral leg edema. Gastrointestinal system:  Nondistended, soft and nontender. Normal bowel sounds heard. Central nervous system: Alert and oriented 3. No focal neurological deficits. Extremities: symmetric power. Skin: no rash    Data Reviewed: I have personally reviewed following labs and imaging studies  CBC:  Recent Labs Lab 01/01/17 0436 01/02/17 0535 01/03/17 0606 01/04/17 0454 01/05/17 0615  WBC 3.9* 3.4* 4.2 3.7* 3.7*  HGB 8.2* 8.5* 8.5* 8.3* 8.9*  HCT 27.6* 28.5* 28.5* 27.5* 29.0*  MCV 97.9 96.9 96.3 95.8 95.7  PLT 201 203 185 171 017   Basic Metabolic Panel:  Recent Labs Lab 01/01/17 0436 01/02/17 0535 01/03/17 0606 01/04/17 0454 01/05/17 0615  NA 133* 134* 132* 133* 134*  K 4.3 3.9 3.3* 3.5 3.2*  CL 94* 94* 93* 94* 93*  CO2 26 29 27 27 29   GLUCOSE 245* 195* 201* 278* 191*  BUN 75* 77* 82* 84* 82*  CREATININE 4.06* 4.01* 4.16* 4.22* 3.99*  CALCIUM 9.1 9.2 9.1 8.9 9.1  PHOS 4.1 3.9 4.1 4.2 3.8   GFR: Estimated Creatinine Clearance: 14.9 mL/min (A) (by C-G formula based on SCr of 3.99 mg/dL (H)). Liver Function Tests:  Recent Labs Lab 01/01/17 0436 01/02/17 0535 01/03/17 0606 01/04/17 0454 01/05/17 0615  ALBUMIN 2.5* 2.6* 2.5* 2.5* 2.6*   No results for input(s): LIPASE, AMYLASE in the last 168 hours. No results for input(s): AMMONIA in the last 168 hours. Coagulation Profile:  Recent Labs Lab 01/01/17 0436 01/02/17 0535 01/03/17 0606 01/04/17 0454 01/05/17 0615  INR 2.01 2.55 2.62 2.82 2.82   Cardiac Enzymes: No  results for input(s): CKTOTAL, CKMB, CKMBINDEX, TROPONINI in the last 168 hours. BNP (last 3 results) No results for input(s): PROBNP in the last 8760 hours. HbA1C: No results for input(s): HGBA1C in the last 72 hours. CBG:  Recent Labs Lab 01/04/17 1618 01/04/17 2002 01/05/17 0355 01/05/17 0752 01/05/17 1123  GLUCAP 147* 181* 204* 196* 221*   Lipid Profile: No results for input(s): CHOL, HDL, LDLCALC, TRIG, CHOLHDL, LDLDIRECT in the last 72 hours. Thyroid Function Tests: No results for input(s): TSH, T4TOTAL, FREET4, T3FREE, THYROIDAB in the last 72 hours. Anemia Panel: No results for input(s): VITAMINB12, FOLATE, FERRITIN, TIBC, IRON, RETICCTPCT in the last 72 hours. Sepsis Labs: No results for input(s): PROCALCITON, LATICACIDVEN in the last 168 hours.  Recent Results (from the past 240 hour(s))  Surgical pcr screen     Status: None   Collection Time: 12/28/16  7:26 PM  Result Value Ref Range Status   MRSA, PCR NEGATIVE NEGATIVE Final  Staphylococcus aureus NEGATIVE NEGATIVE Final    Comment: (NOTE) The Xpert SA Assay (FDA approved for NASAL specimens in patients 55 years of age and older), is one component of a comprehensive surveillance program. It is not intended to diagnose infection nor to guide or monitor treatment.          Radiology Studies: No results found.      Scheduled Meds: . ALPRAZolam  0.5 mg Oral QHS  . amiodarone  200 mg Oral Daily  . calcitRIOL  0.5 mcg Oral Daily  . clopidogrel  75 mg Oral Daily  . cyclobenzaprine  5 mg Oral QHS  . darbepoetin (ARANESP) injection - NON-DIALYSIS  150 mcg Subcutaneous Q Mon-1800  . fluticasone  1 spray Each Nare Daily  . insulin aspart  0-15 Units Subcutaneous TID WC  . insulin glargine  20 Units Subcutaneous QHS  . isosorbide mononitrate  30 mg Oral BID  . latanoprost  1 drop Both Eyes QHS  . loratadine  10 mg Oral Daily  . meclizine  12.5 mg Oral BID  . mouth rinse  15 mL Mouth Rinse BID  .  metoprolol succinate  25 mg Oral QHS  . pantoprazole  40 mg Oral Daily  . rosuvastatin  40 mg Oral q1800  . senna-docusate  2 tablet Oral BID  . sodium chloride flush  3 mL Intravenous Q12H  . warfarin  2 mg Oral ONCE-1800  . Warfarin - Pharmacist Dosing Inpatient   Does not apply q1800   Continuous Infusions: . sodium chloride    . sodium chloride 10 mL/hr at 12/30/16 1254  . furosemide Stopped (01/05/17 1119)     LOS: 13 days    Time spent: 35 minutes.     Vernell Leep, MD, FACP, FHM. [facility] Pager [phone]  If 7PM-7AM, please contact night-coverage [email or web address] Password [email or web address] 01/05/2017, 3:09 PM

## 2017-01-05 NOTE — Progress Notes (Signed)
PT Cancellation Note  Patient Details Name: TERREL MANALO MRN: [id number] DOB: [birth year]   Cancelled Treatment:    Reason Eval/Treat Not Completed: Other (comment).  Pt refused stating that she just walked with mobility tech.  PT to check back tomorrow.    Thanks,    Barbarann Ehlers. [place], [place], DPT [phone]   01/05/2017, 4:00 PM

## 2017-01-05 NOTE — Progress Notes (Signed)
ANTICOAGULATION CONSULT NOTE - Follow Up Consult  Pharmacy Consult for Warfarin Indication: atrial fibrillation  Allergies  Allergen Reactions  . Ace Inhibitors Cough  . Amlodipine Swelling    UNSPECIFIED EDEMA   . Codeine Rash   Patient Measurements: Height: 5\' 8"  (172.7 cm) Weight: 198 lb 4.8 oz (89.9 kg) IBW/kg (Calculated) : 63.9  Vital Signs: Temp: 98.4 F (36.9 C) (11/01 0638) Temp Source: Oral (11/01 3016) BP: 108/62 (11/01 0109) Pulse Rate: 63 (11/01 0638)  Labs:  Recent Labs  01/03/17 0606 01/04/17 0454 01/05/17 0615  HGB 8.5* 8.3* 8.9*  HCT 28.5* 27.5* 29.0*  PLT 185 171 193  LABPROT 27.8* 29.5* 29.4*  INR 2.62 2.82 2.82  CREATININE 4.16* 4.22*  --     Estimated Creatinine Clearance: 14.1 mL/min (A) (by C-G formula based on SCr of 4.22 mg/dL (H)).   Assessment: 71 year old female to continue on warfarin for h/o atrial fibrillation. Recent aVF placement on 10/26. Heparin was stopped on 01/01/17 since INR  therapeutic.   INR today = 2.82  PTA warfarin regimen: 7.5mg  daily alternating with 3.75mg  (admit INR =4.38 on 12/23/16)   Goal of Therapy:  Heparin level 0.3-0.7 units/ml Monitor platelets by anticoagulation protocol: Yes   Plan:  Warfarin 2 mg PO x1 tonight Daily INR/CBC Monitor for s/sx of bleeding  Thank you Anette Guarneri, PharmD [phone] 01/05/2017 8:27 AM

## 2017-01-05 NOTE — Progress Notes (Signed)
Progress Note  Patient Name: [name] Date of Encounter: 01/05/2017  Primary Cardiologist: Orpah Cobb  Subjective   Pt breathing continues to improve slowly. She still has her chronic orthopnea and mild DOE. She is a little down as her husband has been diagnosed with gallbladder cancer and also in his liver and lungs.   Inpatient Medications    Scheduled Meds: . ALPRAZolam  0.5 mg Oral QHS  . amiodarone  200 mg Oral Daily  . calcitRIOL  0.5 mcg Oral Daily  . clopidogrel  75 mg Oral Daily  . cyclobenzaprine  5 mg Oral QHS  . darbepoetin (ARANESP) injection - NON-DIALYSIS  150 mcg Subcutaneous Q Mon-1800  . fluticasone  1 spray Each Nare Daily  . insulin aspart  0-15 Units Subcutaneous TID WC  . insulin glargine  20 Units Subcutaneous QHS  . isosorbide mononitrate  30 mg Oral BID  . latanoprost  1 drop Both Eyes QHS  . loratadine  10 mg Oral Daily  . meclizine  12.5 mg Oral BID  . mouth rinse  15 mL Mouth Rinse BID  . metoprolol succinate  25 mg Oral QHS  . pantoprazole  40 mg Oral Daily  . rosuvastatin  40 mg Oral q1800  . senna-docusate  2 tablet Oral BID  . sodium chloride flush  3 mL Intravenous Q12H  . warfarin  2 mg Oral ONCE-1800  . Warfarin - Pharmacist Dosing Inpatient   Does not apply q1800   Continuous Infusions: . sodium chloride    . sodium chloride 10 mL/hr at 12/30/16 1254  . furosemide 120 mg (01/05/17 1019)   PRN Meds: sodium chloride, acetaminophen, albuterol, diazepam, fentaNYL (SUBLIMAZE) injection, fentaNYL (SUBLIMAZE) injection, metoCLOPramide, ondansetron (ZOFRAN) IV, sodium chloride flush   Vital Signs    Vitals:   01/04/17 1204 01/04/17 2133 01/04/17 2318 01/05/17 0638  BP: (!) 117/46 (!) 121/44 (!) 109/51 108/62  Pulse: 64 65 77 63  Resp: 20 18 18 20   Temp: 98.1 F (36.7 C) 98.2 F (36.8 C) 98.6 F (37 C) 98.4 F (36.9 C)  TempSrc: Oral Oral Oral Oral  SpO2: 100% 98% 100% 100%  Weight:    198 lb 4.8 oz (89.9 kg)    Height:        Intake/Output Summary (Last 24 hours) at 01/05/17 1100 Last data filed at 01/05/17 0646  Gross per 24 hour  Intake              902 ml  Output             1300 ml  Net             -398 ml   Filed Weights   01/03/17 1134 01/04/17 0512 01/05/17 0638  Weight: 199 lb 11.2 oz (90.6 kg) 199 lb 12.8 oz (90.6 kg) 198 lb 4.8 oz (89.9 kg)    Telemetry    Sinus rhythm in the 60's - Personally Reviewed  ECG    No new tracings - Personally Reviewed  Physical Exam   GEN: No acute distress.   Neck: No JVD Cardiac: RRR, no murmurs, rubs, or gallops.  Respiratory: Clear to auscultation bilaterally. GI: Soft, nontender, non-distended  MS: 1+ lower extremity edema; No deformity. Neuro:  Nonfocal  Psych: Normal affect   Labs    Chemistry Recent Labs Lab 01/03/17 0606 01/04/17 0454 01/05/17 0615  NA 132* 133* 134*  K 3.3* 3.5 3.2*  CL 93* 94* 93*  CO2 27 27  29  GLUCOSE 201* 278* 191*  BUN 82* 84* 82*  CREATININE 4.16* 4.22* 3.99*  CALCIUM 9.1 8.9 9.1  ALBUMIN 2.5* 2.5* 2.6*  GFRNONAA 10* 10* 10*  GFRAA 11* 11* 12*  ANIONGAP 12 12 12      Hematology Recent Labs Lab 01/03/17 0606 01/04/17 0454 01/05/17 0615  WBC 4.2 3.7* 3.7*  RBC 2.96* 2.87* 3.03*  HGB 8.5* 8.3* 8.9*  HCT 28.5* 27.5* 29.0*  MCV 96.3 95.8 95.7  MCH 28.7 28.9 29.4  MCHC 29.8* 30.2 30.7  RDW 20.0* 20.1* 19.7*  PLT 185 171 193    Cardiac EnzymesNo results for input(s): TROPONINI in the last 168 hours. No results for input(s): TROPIPOC in the last 168 hours.   BNPNo results for input(s): BNP, PROBNP in the last 168 hours.   DDimer No results for input(s): DDIMER in the last 168 hours.   Radiology    No results found.  Cardiac Studies   Previous studies: Echocardiogram 11/11/2016 Study Conclusions - Left ventricle: The cavity size was normal. Wall thickness was   increased in a pattern of moderate LVH. Systolic function was   moderately to severely reduced. The estimated  ejection fraction   was in the range of 30% to 35%. Features are consistent with a   pseudonormal left ventricular filling pattern, with concomitant   abnormal relaxation and increased filling pressure (grade 2   diastolic dysfunction). Doppler parameters are consistent with   high ventricular filling pressure. - Regional wall motion abnormality: Hypokinesis of the mid inferior   and basal-mid inferolateral myocardium. - Aortic valve: Mildly calcified annulus. Trileaflet; mildly   thickened leaflets. Valve area (VTI): 1.57 cm^2. Valve area   (Vmax): 1.57 cm^2. Valve area (Vmean): 1.51 cm^2. - Mitral valve: Mildly calcified annulus. Mildly thickened leaflets   . There was mild regurgitation. - Left atrium: The atrium was severely dilated. - Right atrium: The atrium was moderately dilated. - Pulmonary arteries: Systolic pressure was moderately increased.   PA peak pressure: 44 mm Hg (S).  Left Heart Cath and Coronary Angiography  12/02/2015  Conclusion    Mid LAD lesion, 60 %stenosed.  Prox Cx to Mid Cx lesion, 99 %stenosed.  Mid RCA lesion, 0 %stenosed at site of prior stent  Dist RCA lesion, 30%stenosed at site of prior stent.  Prox RCA lesion, 40 %stenosed.  Ost Cx to Prox Cx lesion, 80 %stenosed.  LV end diastolic pressure is moderately elevated.   1. 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 Patent stents in the mid RCA and distal RCA/PDA 3. Moderate mid LAD disease- unchanged from prior studies. 4. Elevated LVEDP  Plan: medical therapy with diuresis for CHF.      Patient Profile     71 y.o. female with a history of chronic diastolic congestive heart failure, progressive chronic kidney disease, paroxysmal atrial fibrillation, coronary artery disease.  She was admitted with worsening congestive heart failure.  She has diuresed nicely.  Assessment & Plan    1. Acute on chronic diastolic congestive heart failure: Diuresing with lasix 120 mg  IV BID, switched from oral to IV yesterday by nephrology. Wt is not changing much. Had 1.6L UOP in last 24h (may not be accurate as pt has some incontinence). Is net negative 5.5L fluid balance. Breathing continues to slowly improve. Still has 1+ lower extremity edema. Continue diuresis.  2. CKD:  SCr was 1.71 on admission. It has risen and fluctuated. Is down today to 3.99 from 4.22  yesterday. Nephrology following. Pt has fistula, but is only 38 week old. There is discussion of possibly needing to start dialysis this admission if renal function gets worse.   3. Coronary artery disease: Pt has a subtotal occlusion of the left circ by cath in 11/2015. On Plavix, no aspirin while on anticoagulation, statin, BB  4. Paroxysmal atrial fibrillation: Maintaining sinus rhythm on amiodarone and metorpolol. She is on coumadin for stroke risk reduction. Dosing per pharmacy.   5. 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 Hgb A1c 8.8 in 10/2015, was 10.3 in 07/2016.  SSI while in hospital. Management per primary team.  6. Hyperlipidemia: On rosuvastatin 40 mg daily. Last lipid panel in 07/2015 showed LDL 96. Will update lipid panel.   For questions or updates, please contact [place] Please consult [email or web address] for contact info under Cardiology/STEMI.      Signed, Daune Perch, NP  01/05/2017, 11:00 AM    Attending Note:   The patient was seen and examined.  Agree with assessment and plan as noted above.  Changes made to the above note as needed.  Patient seen and independently examined with Pecolia Ades, NP .   We discussed all aspects of the encounter. I agree with the assessment and plan as stated above.  1.  Acute on chronic diastolic congestive heart failure: Patient has continued to diurese.  Creatinine is actually a little bit better today.  2.  Paroxysmal atrial fibrillation: She is on amiodarone and metoprolol.  Continues to have normal sinus rhythm.  She is on Coumadin.   I have  spent a total of 40 minutes with patient reviewing hospital  notes , telemetry, EKGs, labs and examining patient as well as establishing an assessment and plan that was discussed with the patient. > 50% of time was spent in direct patient care.    Thayer Headings, Brooke Bonito., MD, [facility] 01/05/2017, 12:10 PM [address]  [place] Pager [phone]

## 2017-01-05 NOTE — Progress Notes (Addendum)
Inpatient Diabetes Program Recommendations  AACE/ADA: New Consensus Statement on Inpatient Glycemic Control (2015)  Target Ranges:  Prepandial:   less than 140 mg/dL      Peak postprandial:   less than 180 mg/dL (1-2 hours)      Critically ill patients:  140 - 180 mg/dL   Results for [name], [name] (MRN [id number]) as of 01/05/2017 08:34  Ref. Range 01/04/2017 08:06 01/04/2017 11:59 01/04/2017 16:18 01/04/2017 20:02  Glucose-Capillary Latest Ref Range: 65 - 99 mg/dL 253 (H)  8 units Novolog 169 (H)  3 units Novolog 147 (H)  2 units Novolog 181 (H)  20 units Lantus   Results for [name], [name] (MRN [id number]) as of 01/05/2017 08:34  Ref. Range 01/05/2017 07:52  Glucose-Capillary Latest Ref Range: 65 - 99 mg/dL 196 (H)    Home DM Meds: Lantus 40 units QHS                             Novolog 10 units TID  Current Insulin Orders: Lantus 20 units QHS                                       Novolog Moderate Correction Scale/ SSI (0-15 units) TID AC      MD- Please consider the following in-hospital insulin adjustments:  1. Increase Lantus slightly to 25 units QHS  2. Start low dose Novolog Meal Coverage: Novolog 3 units TID with meals (hold if pt eats <50% of meal)       --Will follow patient during hospitalization--  Wyn Quaker RN, MSN, CDE Diabetes Coordinator Inpatient Glycemic Control Team Team Pager: [phone] (8a-5p)

## 2017-01-06 LAB — RENAL FUNCTION PANEL
Albumin: 2.6 g/dL — ABNORMAL LOW (ref 3.5–5.0)
Anion gap: 12 (ref 5–15)
BUN: 84 mg/dL — AB (ref 6–20)
CHLORIDE: 95 mmol/L — AB (ref 101–111)
CO2: 27 mmol/L (ref 22–32)
CREATININE: 4 mg/dL — AB (ref 0.44–1.00)
Calcium: 8.8 mg/dL — ABNORMAL LOW (ref 8.9–10.3)
GFR calc Af Amer: 12 mL/min — ABNORMAL LOW (ref >60)
GFR calc non Af Amer: 10 mL/min — ABNORMAL LOW (ref >60)
GLUCOSE: 206 mg/dL — AB (ref 65–99)
Phosphorus: 4.2 mg/dL (ref 2.5–4.6)
Potassium: 3.2 mmol/L — ABNORMAL LOW (ref 3.5–5.1)
Sodium: 134 mmol/L — ABNORMAL LOW (ref 135–145)

## 2017-01-06 LAB — CBC
HCT: 28.7 % — ABNORMAL LOW (ref 36.0–46.0)
Hemoglobin: 8.6 g/dL — ABNORMAL LOW (ref 12.0–15.0)
MCH: 28.7 pg (ref 26.0–34.0)
MCHC: 30 g/dL (ref 30.0–36.0)
MCV: 95.7 fL (ref 78.0–100.0)
PLATELETS: 178 10*3/uL (ref 150–400)
RBC: 3 MIL/uL — ABNORMAL LOW (ref 3.87–5.11)
RDW: 20 % — AB (ref 11.5–15.5)
WBC: 3.2 10*3/uL — ABNORMAL LOW (ref 4.0–10.5)

## 2017-01-06 LAB — LIPID PANEL
CHOL/HDL RATIO: 2.1 ratio
CHOLESTEROL: 100 mg/dL (ref 0–200)
HDL: 47 mg/dL (ref >40)
LDL CALC: 41 mg/dL (ref 0–99)
Triglycerides: 62 mg/dL (ref <150)
VLDL: 12 mg/dL (ref 0–40)

## 2017-01-06 LAB — GLUCOSE, CAPILLARY
GLUCOSE-CAPILLARY: 243 mg/dL — AB (ref 65–99)
Glucose-Capillary: 169 mg/dL — ABNORMAL HIGH (ref 65–99)
Glucose-Capillary: 154 mg/dL — ABNORMAL HIGH (ref 65–99)
Glucose-Capillary: 261 mg/dL — ABNORMAL HIGH (ref 65–99)

## 2017-01-06 LAB — PROTIME-INR
INR: 2.64
PROTHROMBIN TIME: 28 s — AB (ref 11.4–15.2)

## 2017-01-06 MED ORDER — INSULIN ASPART 100 UNIT/ML ~~LOC~~ SOLN
0.0000 [IU] | Freq: Three times a day (TID) | SUBCUTANEOUS | Status: DC
  Administered 2017-01-07: 3 [IU] via SUBCUTANEOUS
  Administered 2017-01-07: 2 [IU] via SUBCUTANEOUS
  Administered 2017-01-07: 3 [IU] via SUBCUTANEOUS
  Administered 2017-01-08 (×2): 5 [IU] via SUBCUTANEOUS
  Administered 2017-01-09: 3 [IU] via SUBCUTANEOUS

## 2017-01-06 MED ORDER — WARFARIN SODIUM 2 MG PO TABS
2.0000 mg | ORAL_TABLET | Freq: Every day | ORAL | Status: DC
  Administered 2017-01-06: 2 mg via ORAL
  Filled 2017-01-06: qty 1

## 2017-01-06 MED ORDER — INSULIN ASPART 100 UNIT/ML ~~LOC~~ SOLN
0.0000 [IU] | Freq: Every day | SUBCUTANEOUS | Status: DC
  Administered 2017-01-06 – 2017-01-09 (×3): 2 [IU] via SUBCUTANEOUS

## 2017-01-06 MED ORDER — POTASSIUM CHLORIDE CRYS ER 20 MEQ PO TBCR
40.0000 meq | EXTENDED_RELEASE_TABLET | Freq: Once | ORAL | Status: AC
  Administered 2017-01-06: 40 meq via ORAL
  Filled 2017-01-06: qty 2

## 2017-01-06 MED ORDER — FUROSEMIDE 10 MG/ML IJ SOLN
160.0000 mg | Freq: Three times a day (TID) | INTRAVENOUS | Status: DC
  Administered 2017-01-06 – 2017-01-07 (×4): 160 mg via INTRAVENOUS
  Filled 2017-01-06: qty 16
  Filled 2017-01-06: qty 4
  Filled 2017-01-06 (×3): qty 16
  Filled 2017-01-06: qty 10
  Filled 2017-01-06: qty 16

## 2017-01-06 NOTE — Progress Notes (Signed)
PT Cancellation Note  Patient Details Name: [name] MRN: [id number] DOB: 04/16/1945   Cancelled Treatment:    Reason Eval/Treat Not Completed: (P)  (Pt declined PT session. Reports that she just walked with mobility tech. Will attempt again this evening )  [name], [place] 01/06/2017, 12:05 PM

## 2017-01-06 NOTE — Progress Notes (Signed)
Occupational Therapy Treatment Patient Details Name: [name] MRN: [id number] DOB: 07-03-1945 Today's Date: 01/06/2017    History of present illness 71 y.o. female PMH of  CAD (s/p NSTEMI in 06/2015 with DES to PDA and RCA at that time, subtotal occlusion of LCX treated medically; repeat cath 11/2015 with stable disease; inferolateral scar w/o ischemia), chronic combined systolic and diastolic CHF (EF 24-09% by echo in 11/2016), paroxysmal atrial fibrillation, right subclavian stenosis (s/p intervention), IDDM, HTN, HLD, and CKD Stage IV admitted on 12/23/2016 for worsening edema and fatigue. Pt being treated for acute on chronic combined systolic and diastolic CHF, acute on chronic Stage 4 CKD, paroxysmal A-fib, CAD, anemia, hypoprothrobinemia, and IDDM.    OT comments  Pt. Able to tolerate in room ambulation for standing grooming tasks at sink and simulated toileting tasks. Requires cues for rw management and full pivot before sitting down.    Follow Up Recommendations  SNF;Supervision/Assistance - 24 hour    Equipment Recommendations       Recommendations for Other Services      Precautions / Restrictions Precautions Precautions: Fall       Mobility Bed Mobility               General bed mobility comments: pt. seated in recliner at beginning and end of session  Transfers Overall transfer level: Needs assistance Equipment used: Rolling walker (2 wheeled) Transfers: Sit to/from [facility] Sit to Stand: Min guard         General transfer comment: cues for tech. attempts to sit before she is at desired surface, also difficulty with rw management    Balance                                           ADL either performed or assessed with clinical judgement   ADL Overall ADL's : Needs assistance/impaired     Grooming: Wash/dry hands;Min guard;Standing               Lower Body Dressing: Minimal  assistance;Sitting/lateral leans   Toilet Transfer: Min Marine scientist Details (indicate cue type and reason): simulated in room: VC's for safety and sequencing - reach for armrests on Chair as pt is inclined to sit uncontrolled while holding onto RW and before she has reached desired surface         Functional mobility during ADLs: Min guard;Minimal assistance;Rolling walker General ADL Comments: cues for walker safety and walker management     Vision       Perception     Praxis      Cognition Arousal/Alertness: Awake/alert Behavior During Therapy: WFL for tasks assessed/performed Overall Cognitive Status: Within Functional Limits for tasks assessed                                          Exercises     Shoulder Instructions       General Comments      Pertinent Vitals/ Pain       Pain Assessment: No/denies pain  Home Living  Prior Functioning/Environment              Frequency  Min 2X/week        Progress Toward Goals  OT Goals(current goals can now be found in the care plan section)  Progress towards OT goals: Progressing toward goals     Plan Discharge plan remains appropriate;Frequency remains appropriate    Co-evaluation                 AM-PAC PT "6 Clicks" Daily Activity     Outcome Measure   Help from another person eating meals?: A Little Help from another person taking care of personal grooming?: A Little Help from another person toileting, which includes using toliet, bedpan, or urinal?: A Little Help from another person bathing (including washing, rinsing, drying)?: A Lot Help from another person to put on and taking off regular upper body clothing?: A Little Help from another person to put on and taking off regular lower body clothing?: A Lot 6 Click Score: 16    End of Session Equipment Utilized During Treatment: Gait  belt;Rolling walker  OT Visit Diagnosis: Unsteadiness on feet (R26.81)   Activity Tolerance Patient tolerated treatment well   Patient Left in chair;with call bell/phone within reach   Nurse Communication          Time: 1657-9038 OT Time Calculation (min): 17 min  Charges: OT General Charges $OT Visit: 1 Visit OT Treatments $Self Care/Home Management : 8-22 mins  Janice Coffin, COTA/L 01/06/2017, 12:31 PM

## 2017-01-06 NOTE — Progress Notes (Signed)
Physical Therapy Treatment Patient Details Name: [name] MRN: [id number] DOB: 07/19/1945 Today's Date: 01/06/2017    History of Present Illness 71 y.o. female PMH of  CAD (s/p NSTEMI in 06/2015 with DES to PDA and RCA at that time, subtotal occlusion of LCX treated medically; repeat cath 11/2015 with stable disease; inferolateral scar w/o ischemia), chronic combined systolic and diastolic CHF (EF 12-45% by echo in 11/2016), paroxysmal atrial fibrillation, right subclavian stenosis (s/p intervention), IDDM, HTN, HLD, and CKD Stage IV admitted on 12/23/2016 for worsening edema and fatigue. Pt being treated for acute on chronic combined systolic and diastolic CHF, acute on chronic Stage 4 CKD, paroxysmal A-fib, CAD, anemia, hypoprothrobinemia, and IDDM.     PT Comments    Pt declined walking today due to previous ambulation today. Agreed to perform seated exercises in recliner. VC for proper technique and speed. AAROM for L hip abd/add in supine due to pain. Pt will benefit from continued PT services to help increase independence with mobility.   Follow Up Recommendations  SNF     Equipment Recommendations  None recommended by PT    Recommendations for Other Services       Precautions / Restrictions Precautions Precautions: Fall Restrictions Weight Bearing Restrictions: No    Mobility  Bed Mobility               General bed mobility comments: pt. seated in recliner at beginning of session. Declined to ambulate due to previously working with OT and mobility tech today.  Transfers                    Ambulation/Gait                 Stairs            Wheelchair Mobility    Modified Rankin (Stroke Patients Only)       Balance Overall balance assessment: Needs assistance Sitting-balance support: No upper extremity supported;Feet supported Sitting balance-[name]: Good Sitting balance - Comments: able to sit up during LE seated  exercises                                    Cognition Arousal/Alertness: Awake/alert Behavior During Therapy: WFL for tasks assessed/performed Overall Cognitive Status: Within Functional Limits for tasks assessed                                        Exercises General Exercises - Lower Extremity Ankle Circles/Pumps: AROM;Both;10 reps Long Arc Quad: AROM;Both;10 reps;Seated Hip ABduction/ADduction: AROM;Left;AAROM;Right;10 reps;Supine Straight Leg Raises: AROM;10 reps;Both;Supine Hip Flexion/Marching: AROM;Both;10 reps;Seated    General Comments        Pertinent Vitals/Pain Pain Assessment: 0-10 Pain Score: 5  Pain Location: L leg Pain Descriptors / Indicators: Sore Pain Intervention(s): Monitored during session;Repositioned    Home Living                      Prior Function            PT Goals (current goals can now be found in the care plan section) Acute Rehab PT Goals Patient Stated Goal: didn't discuss today Progress towards PT goals: Progressing toward goals    Frequency    Min 3X/week      PT Plan Current plan  remains appropriate    Co-evaluation              AM-PAC PT "6 Clicks" Daily Activity  Outcome Measure  Difficulty turning over in bed (including adjusting bedclothes, sheets and blankets)?: A Little Difficulty moving from lying on back to sitting on the side of the bed? : A Lot Difficulty sitting down on and standing up from a chair with arms (e.g., wheelchair, bedside commode, etc,.)?: Unable Help needed moving to and from a bed to chair (including a wheelchair)?: A Lot Help needed walking in hospital room?: A Lot Help needed climbing 3-5 steps with a railing? : Total 6 Click Score: 11    End of Session   Activity Tolerance: Patient tolerated treatment well Patient left: in chair;with call bell/phone within reach Nurse Communication: Mobility status PT Visit Diagnosis: Unsteadiness on  feet (R26.81);Other abnormalities of gait and mobility (R26.89);Muscle weakness (generalized) (M62.81)     Time: 0160-1093 PT Time Calculation (min) (ACUTE ONLY): 16 min  Charges:  $Therapeutic Exercise: 8-22 mins                    G Codes:       Fransisca Connors, SPTA    Fransisca Connors 01/06/2017, 4:31 PM

## 2017-01-06 NOTE — Progress Notes (Signed)
ANTICOAGULATION CONSULT NOTE - Follow Up Consult  Pharmacy Consult for Warfarin Indication: atrial fibrillation  Allergies  Allergen Reactions  . Ace Inhibitors Cough  . Amlodipine Swelling    UNSPECIFIED EDEMA   . Codeine Rash   Patient Measurements: Height: 5\' 8"  (172.7 cm) Weight: 194 lb 10.7 oz (88.3 kg) IBW/kg (Calculated) : 63.9  Vital Signs: Temp: 98.5 F (36.9 C) (11/02 0629) Temp Source: Oral (11/02 0629) BP: 107/73 (11/02 0629) Pulse Rate: 66 (11/02 0629)  Labs:  Recent Labs  01/04/17 0454 01/05/17 0615 01/06/17 0416  HGB 8.3* 8.9* 8.6*  HCT 27.5* 29.0* 28.7*  PLT 171 193 178  LABPROT 29.5* 29.4* 28.0*  INR 2.82 2.82 2.64  CREATININE 4.22* 3.99* 4.00*    Estimated Creatinine Clearance: 14.8 mL/min (A) (by C-G formula based on SCr of 4 mg/dL (H)).   Assessment: 71 year old female to continue on warfarin for h/o atrial fibrillation. Recent aVF placement on 10/26. Heparin was stopped on 01/01/17 since INR  therapeutic.   INR today = 2.64  PTA warfarin regimen: 7.5mg  daily alternating with 3.75mg  (admit INR =4.38 on 12/23/16)   Goal of Therapy:  Heparin level 0.3-0.7 units/ml Monitor platelets by anticoagulation protocol: Yes   Plan:  Warfarin 2 mg PO daily for now Daily INR/CBC Monitor for s/sx of bleeding  Thank you Anette Guarneri, PharmD [phone] 01/06/2017 8:32 AM

## 2017-01-06 NOTE — Clinical Social Work Note (Signed)
Facesheet now showing Medicare A & B as primary insurance and BCBS as secondary. CSW left voicemail for admissions coordinator at [facility] to see if she could confirm.  Dayton Scrape, [place]

## 2017-01-06 NOTE — Progress Notes (Signed)
PROGRESS NOTE    [name]  UKG:[id number] DOB: [birth year] DOA: 12/23/2016 PCP: [name], MD   Brief Narrative: [name] is a 71 y.o. female with medical history significant for insulin-dependent diabetes mellitus, chronic kidney disease stage IV, chronic combined systolic/diastolic CHF, chronic atrial fibrillation, and coronary artery disease, presented to the ED at the direction of her nephrologist for evaluation of swelling and fatigue. She has been followed by cardiology and nephrology for worsening fluid status complicating worsening kidney function. She continued to be volume overloaded despite increase in her diuretics recently. Admitted with acute on chronic diastolic heart failure secondary to Acute on chronic renal failure.  Nephrology and cardiology following. Nephrology adjusting diuretics and hoping to avoid initiating hemodialysis.   Assessment & Plan:   Principal Problem:   CKD (chronic kidney disease), stage IV (HCC) Active Problems:   Essential hypertension   Coronary artery disease   Acute on chronic combined systolic and diastolic CHF (congestive heart failure) (HCC)   Elevated troponin   DM type 2 causing vascular disease (HCC)   Pleural effusion on right   Anemia in chronic kidney disease   Atrial fibrillation (HCC)   Hypokalemia   Hypervolemia   Rhonchi   Acute on chronic combined systolic/diastolic CHF  Echo 09/10/26, EF 31-51%, grade 2 diastolic dysfunction, mild MR, severe LAE Nephrology helping with diuresis and on 10/31 have changed oral Lasix to IV Lasix 120 mg twice a day.  - 6.4 L since admission. 1.6 L urine output documented yesterday but patient repeatedly states that not all of her urine output is being charted in EMR Weight down from 211 pounds on 10/19-194 pounds on 11/2. Holding Metolazone due to increase cr.  Still has significant volume overload. Please see discussion below.  Acute kidney injury superimposed on CKD stage  IV  Underwent AV graft placement 10-26. Possible mild steal post op.  Nephrology following. She was started on high-dose IV Lasix 120 mg twice a day on 10/31, creatinine fluctuating but essentially in the 4 range. Patient still has significant volume on board and not sure if we will be able to remove by aggressive diuresis alone without worsening her renal functions. I had long discussion with patient regarding the current complicated situation and her desire to go home. I discussed ultimate need for HD if her volume status doesn't improve or renal functions worsen. She was willing to think about it. I discussed with Dr. Hollie Salk. Nephrology hoping to avoid initiating HD this admission so that patient can focus on her husband was just diagnosed with stage IV cancer.  Paroxysmal A fib;   - Remains in sinus rhythm. Cardiology following. Continue amiodarone, metoprolol, Plavix and anticoagulated on Coumadin.  CAD,  Asymptomatic of chest pain. Continue Plavix, Imdur, metoprolol and statins.  Diabetes; type 2;  CBG is uncontrolled and mildly fluctuating. Currently on Lantus 20 units at bedtime and NovoLog SSI, moderate sensitivity. Continue current regimen without change.  Hypokalemia Replace aggressively and follow.  Anemia Stable. Status post 1 dose of Feraheme.  Essential hypertension Controlled   DVT prophylaxis: Anticoagulated on Coumadin Code Status: Full code Family Communication: None at bedside. Disposition Plan: DC to? SNF when medically improved and stable. Not medically stable for discharge.   Consultants:   Nephrology   Cardiology   Procedures:  None   Antimicrobials:  None   Subjective: No new complaints. States that she is making good amounts of urine. No dyspnea or chest pain reported.   Objective: Vitals:  01/06/17 0629 01/06/17 0652 01/06/17 1052 01/06/17 1125  BP: 107/73  (!) 98/40 (!) 108/48  Pulse: 66  (!) 55 (!) 58  Resp: 18   18  Temp: 98.5 F  (36.9 C)   97.8 F (36.6 C)  TempSrc: Oral   Oral  SpO2: 96%   98%  Weight:  88.3 kg (194 lb 10.7 oz)    Height:        Intake/Output Summary (Last 24 hours) at 01/06/17 1657 Last data filed at 01/06/17 1526  Gross per 24 hour  Intake              402 ml  Output              995 ml  Net             -593 ml   Filed Weights   01/04/17 0512 01/05/17 0638 01/06/17 0652  Weight: 90.6 kg (199 lb 12.8 oz) 89.9 kg (198 lb 4.8 oz) 88.3 kg (194 lb 10.7 oz)    Examination:  General exam; pleasant elderly female, sitting up comfortably at edge of bed this morning. Respiratory system: Occasional basal crackles but otherwise clear to auscultation. No increased work of breathing. Stable without change. Cardiovascular system: S1 and S2 heard, RRR. No JVD or murmurs. 2+ pitting bilateral leg edema. No significant improvement in lower extremity edema. Telemetry personally reviewed: SB in the 50s-NSR. Gastrointestinal system:  Nondistended, soft and nontender. Normal bowel sounds heard. Central nervous system: Alert and oriented 3. No focal neurological deficits. Extremities: symmetric power. Skin: no rash    Data Reviewed: I have personally reviewed following labs and imaging studies  CBC:  Recent Labs Lab 01/02/17 0535 01/03/17 0606 01/04/17 0454 01/05/17 0615 01/06/17 0416  WBC 3.4* 4.2 3.7* 3.7* 3.2*  HGB 8.5* 8.5* 8.3* 8.9* 8.6*  HCT 28.5* 28.5* 27.5* 29.0* 28.7*  MCV 96.9 96.3 95.8 95.7 95.7  PLT 203 185 171 193 299   Basic Metabolic Panel:  Recent Labs Lab 01/02/17 0535 01/03/17 0606 01/04/17 0454 01/05/17 0615 01/06/17 0416  NA 134* 132* 133* 134* 134*  K 3.9 3.3* 3.5 3.2* 3.2*  CL 94* 93* 94* 93* 95*  CO2 29 27 27 29 27   GLUCOSE 195* 201* 278* 191* 206*  BUN 77* 82* 84* 82* 84*  CREATININE 4.01* 4.16* 4.22* 3.99* 4.00*  CALCIUM 9.2 9.1 8.9 9.1 8.8*  PHOS 3.9 4.1 4.2 3.8 4.2   GFR: Estimated Creatinine Clearance: 14.8 mL/min (A) (by C-G formula based on SCr  of 4 mg/dL (H)). Liver Function Tests:  Recent Labs Lab 01/02/17 0535 01/03/17 0606 01/04/17 0454 01/05/17 0615 01/06/17 0416  ALBUMIN 2.6* 2.5* 2.5* 2.6* 2.6*   No results for input(s): LIPASE, AMYLASE in the last 168 hours. No results for input(s): AMMONIA in the last 168 hours. Coagulation Profile:  Recent Labs Lab 01/02/17 0535 01/03/17 0606 01/04/17 0454 01/05/17 0615 01/06/17 0416  INR 2.55 2.62 2.82 2.82 2.64   Cardiac Enzymes: No results for input(s): CKTOTAL, CKMB, CKMBINDEX, TROPONINI in the last 168 hours. BNP (last 3 results) No results for input(s): PROBNP in the last 8760 hours. HbA1C: No results for input(s): HGBA1C in the last 72 hours. CBG:  Recent Labs Lab 01/05/17 1701 01/05/17 2146 01/06/17 0744 01/06/17 1123 01/06/17 1633  GLUCAP 212* 295* 169* 154* 261*   Lipid Profile:  Recent Labs  01/06/17 0416  CHOL 100  HDL 47  LDLCALC 41  TRIG 62  CHOLHDL 2.1   Thyroid  Function Tests: No results for input(s): TSH, T4TOTAL, FREET4, T3FREE, THYROIDAB in the last 72 hours. Anemia Panel: No results for input(s): VITAMINB12, FOLATE, FERRITIN, TIBC, IRON, RETICCTPCT in the last 72 hours. Sepsis Labs: No results for input(s): PROCALCITON, LATICACIDVEN in the last 168 hours.  Recent Results (from the past 240 hour(s))  Surgical pcr screen     Status: None   Collection Time: 12/28/16  7:26 PM  Result Value Ref Range Status   MRSA, PCR NEGATIVE NEGATIVE Final   Staphylococcus aureus NEGATIVE NEGATIVE Final    Comment: (NOTE) The Xpert SA Assay (FDA approved for NASAL specimens in patients 28 years of age and older), is one component of a comprehensive surveillance program. It is not intended to diagnose infection nor to guide or monitor treatment.          Radiology Studies: No results found.      Scheduled Meds: . ALPRAZolam  0.5 mg Oral QHS  . amiodarone  200 mg Oral Daily  . calcitRIOL  0.5 mcg Oral Daily  . clopidogrel  75  mg Oral Daily  . cyclobenzaprine  5 mg Oral QHS  . darbepoetin (ARANESP) injection - NON-DIALYSIS  150 mcg Subcutaneous Q Mon-1800  . fluticasone  1 spray Each Nare Daily  . insulin aspart  0-15 Units Subcutaneous TID WC  . insulin glargine  20 Units Subcutaneous QHS  . isosorbide mononitrate  30 mg Oral BID  . latanoprost  1 drop Both Eyes QHS  . loratadine  10 mg Oral Daily  . meclizine  12.5 mg Oral BID  . mouth rinse  15 mL Mouth Rinse BID  . metoprolol succinate  25 mg Oral QHS  . pantoprazole  40 mg Oral Daily  . rosuvastatin  40 mg Oral q1800  . senna-docusate  2 tablet Oral BID  . sodium chloride flush  3 mL Intravenous Q12H  . warfarin  2 mg Oral q1800  . Warfarin - Pharmacist Dosing Inpatient   Does not apply q1800   Continuous Infusions: . sodium chloride    . sodium chloride 10 mL/hr at 12/30/16 1254  . furosemide 160 mg (01/06/17 1600)     LOS: 14 days    Time spent: 35 minutes.     Vernell Leep, MD, FACP, FHM. [facility] Pager [phone]  If 7PM-7AM, please contact night-coverage [email or web address] Password [facility] 01/06/2017, 4:57 PM

## 2017-01-06 NOTE — Progress Notes (Signed)
S: expresses desire to be with husband at home.  O:BP (!) 108/48 (BP Location: Right Arm)   Pulse (!) 58   Temp 97.8 F (36.6 C) (Oral)   Resp 18   Ht 5\' 8"  (1.727 m)   Wt 88.3 kg (194 lb 10.7 oz)   SpO2 98%   BMI 29.60 kg/m   Intake/Output Summary (Last 24 hours) at 01/06/17 1609 Last data filed at 01/06/17 1526  Gross per 24 hour  Intake              402 ml  Output              995 ml  Net             -593 ml   Intake/Output: I/O last 3 completed shifts: In: 464 [P.O.:340; IV Piggyback:124] Out: 1875 [Urine:1875]  Intake/Output this shift:  Total I/O In: 240 [P.O.:240] Out: 620 [Urine:620] Weight change: -1.648 kg (-3 lb 10.1 oz)  Gen: NAD CVS: Regular rate and rhythm  Resp: bilaterally decreased breath sounds at bases,with few basal crackles. Abd: soft, non tender, bowel sounds positive. Ext: 2+ lower extremity edema, left AVG with good bruit.   Recent Labs Lab 12/31/16 0804 01/01/17 0436 01/02/17 0535 01/03/17 0606 01/04/17 0454 01/05/17 0615 01/06/17 0416  NA 135 133* 134* 132* 133* 134* 134*  K 5.4* 4.3 3.9 3.3* 3.5 3.2* 3.2*  CL 95* 94* 94* 93* 94* 93* 95*  CO2 27 26 29 27 27 29 27   GLUCOSE 266* 245* 195* 201* 278* 191* 206*  BUN 68* 75* 77* 82* 84* 82* 84*  CREATININE 3.50* 4.06* 4.01* 4.16* 4.22* 3.99* 4.00*  ALBUMIN 2.9* 2.5* 2.6* 2.5* 2.5* 2.6* 2.6*  CALCIUM 9.3 9.1 9.2 9.1 8.9 9.1 8.8*  PHOS 4.1 4.1 3.9 4.1 4.2 3.8 4.2   Liver Function Tests:  Recent Labs Lab 01/04/17 0454 01/05/17 0615 01/06/17 0416  ALBUMIN 2.5* 2.6* 2.6*   No results for input(s): LIPASE, AMYLASE in the last 168 hours. No results for input(s): AMMONIA in the last 168 hours. CBC:  Recent Labs Lab 01/02/17 0535 01/03/17 0606 01/04/17 0454 01/05/17 0615 01/06/17 0416  WBC 3.4* 4.2 3.7* 3.7* 3.2*  HGB 8.5* 8.5* 8.3* 8.9* 8.6*  HCT 28.5* 28.5* 27.5* 29.0* 28.7*  MCV 96.9 96.3 95.8 95.7 95.7  PLT 203 185 171 193 178   Cardiac Enzymes: No results for  input(s): CKTOTAL, CKMB, CKMBINDEX, TROPONINI in the last 168 hours. CBG:  Recent Labs Lab 01/05/17 1123 01/05/17 1701 01/05/17 2146 01/06/17 0744 01/06/17 1123  GLUCAP 221* 212* 295* 169* 154*    Iron Studies: No results for input(s): IRON, TIBC, TRANSFERRIN, FERRITIN in the last 72 hours. Studies/Results: No results found. . ALPRAZolam  0.5 mg Oral QHS  . amiodarone  200 mg Oral Daily  . calcitRIOL  0.5 mcg Oral Daily  . clopidogrel  75 mg Oral Daily  . cyclobenzaprine  5 mg Oral QHS  . darbepoetin (ARANESP) injection - NON-DIALYSIS  150 mcg Subcutaneous Q Mon-1800  . fluticasone  1 spray Each Nare Daily  . insulin aspart  0-15 Units Subcutaneous TID WC  . insulin glargine  20 Units Subcutaneous QHS  . isosorbide mononitrate  30 mg Oral BID  . latanoprost  1 drop Both Eyes QHS  . loratadine  10 mg Oral Daily  . meclizine  12.5 mg Oral BID  . mouth rinse  15 mL Mouth Rinse BID  . metoprolol succinate  25 mg Oral QHS  .  pantoprazole  40 mg Oral Daily  . rosuvastatin  40 mg Oral q1800  . senna-docusate  2 tablet Oral BID  . sodium chloride flush  3 mL Intravenous Q12H  . warfarin  2 mg Oral q1800  . Warfarin - Pharmacist Dosing Inpatient   Does not apply q1800    BMET    Component Value Date/Time   NA 134 (L) 01/06/2017 0416   NA 141 02/18/2016 0937   K 3.2 (L) 01/06/2017 0416   CL 95 (L) 01/06/2017 0416   CO2 27 01/06/2017 0416   GLUCOSE 206 (H) 01/06/2017 0416   BUN 84 (H) 01/06/2017 0416   BUN 53 (H) 02/18/2016 0937   CREATININE 4.00 (H) 01/06/2017 0416   CREATININE 1.73 (H) 10/08/2013 1024   CALCIUM 8.8 (L) 01/06/2017 0416   GFRNONAA 10 (L) 01/06/2017 0416   GFRAA 12 (L) 01/06/2017 0416   CBC    Component Value Date/Time   WBC 3.2 (L) 01/06/2017 0416   RBC 3.00 (L) 01/06/2017 0416   HGB 8.6 (L) 01/06/2017 0416   HCT 28.7 (L) 01/06/2017 0416   PLT 178 01/06/2017 0416   MCV 95.7 01/06/2017 0416   MCH 28.7 01/06/2017 0416   MCHC 30.0 01/06/2017 0416    RDW 20.0 (H) 01/06/2017 0416   LYMPHSABS 0.8 12/27/2016 0524   MONOABS 0.4 12/27/2016 0524   EOSABS 0.1 12/27/2016 0524   BASOSABS 0.0 12/27/2016 0524     Assessment/Plan:  AKI with CKD: She is still sig vol overloaded despite increasing doses of IV Lasix.  She is really wanting to go home with her husband.  She lives in [place] and so wouldn't be CLIP'd to one of our units unfortunately and I believe she only last 3-4 days as an outpatient without dialysis at this point (which is not enough time to meet a new nephrologist, get assigned an HD unit, and get a tunneled cath).  Otherwise we could have discharged her on max PO Lasix and simultaneously start the CLIP process.  I ultimately think that if her primary objective is to go home to be with her husband we start HD now and get her there.  I will talk to her about it.  HFrEF. Cardiology is following,remained volume overloaded.  Anemia. Hemoglobin stable,  had 1 dose of Feraheme. - Aranesp 150 per week.  Hypertension. BP s a little soft.  Afib. Rate controlled with amiodarone and beta blocker, on warfarin DM. per primary  Madelon Lips MD [place] pgr [phone]

## 2017-01-06 NOTE — Progress Notes (Signed)
Progress Note  Patient Name: [name] Date of Encounter: 01/06/2017  Primary Cardiologist: Orpah Cobb  Subjective   Pt was up walking in the hall this am with PT. She denies any significant shortness of breath with moving slowly. She still has significant edema of the lower legs and burning of the left leg.   Inpatient Medications    Scheduled Meds: . ALPRAZolam  0.5 mg Oral QHS  . amiodarone  200 mg Oral Daily  . calcitRIOL  0.5 mcg Oral Daily  . clopidogrel  75 mg Oral Daily  . cyclobenzaprine  5 mg Oral QHS  . darbepoetin (ARANESP) injection - NON-DIALYSIS  150 mcg Subcutaneous Q Mon-1800  . fluticasone  1 spray Each Nare Daily  . insulin aspart  0-15 Units Subcutaneous TID WC  . insulin glargine  20 Units Subcutaneous QHS  . isosorbide mononitrate  30 mg Oral BID  . latanoprost  1 drop Both Eyes QHS  . loratadine  10 mg Oral Daily  . meclizine  12.5 mg Oral BID  . mouth rinse  15 mL Mouth Rinse BID  . metoprolol succinate  25 mg Oral QHS  . pantoprazole  40 mg Oral Daily  . rosuvastatin  40 mg Oral q1800  . senna-docusate  2 tablet Oral BID  . sodium chloride flush  3 mL Intravenous Q12H  . warfarin  2 mg Oral q1800  . Warfarin - Pharmacist Dosing Inpatient   Does not apply q1800   Continuous Infusions: . sodium chloride    . sodium chloride 10 mL/hr at 12/30/16 1254  . furosemide Stopped (01/06/17 1141)   PRN Meds: sodium chloride, acetaminophen, albuterol, diazepam, fentaNYL (SUBLIMAZE) injection, fentaNYL (SUBLIMAZE) injection, metoCLOPramide, ondansetron (ZOFRAN) IV, sodium chloride flush   Vital Signs    Vitals:   01/06/17 0629 01/06/17 0652 01/06/17 1052 01/06/17 1125  BP: 107/73  (!) 98/40 (!) 108/48  Pulse: 66  (!) 55 (!) 58  Resp: 18   18  Temp: 98.5 F (36.9 C)   97.8 F (36.6 C)  TempSrc: Oral   Oral  SpO2: 96%   98%  Weight:  194 lb 10.7 oz (88.3 kg)    Height:        Intake/Output Summary (Last 24 hours) at 01/06/17  1230 Last data filed at 01/06/17 1000  Gross per 24 hour  Intake              282 ml  Output              775 ml  Net             -493 ml   Filed Weights   01/04/17 0512 01/05/17 0638 01/06/17 0652  Weight: 199 lb 12.8 oz (90.6 kg) 198 lb 4.8 oz (89.9 kg) 194 lb 10.7 oz (88.3 kg)    Telemetry    Sinus rhythm around 60 bpm. - Personally Reviewed  ECG    No new tracings - Personally Reviewed  Physical Exam   GEN: No acute distress.   Neck: No JVD Cardiac: RRR, no murmurs, rubs, or gallops.  Respiratory: Clear to auscultation bilaterally, diminished in the bases. GI: Soft, nontender, non-distended  MS: 1+ lower leg edema; No deformity. Neuro:  Nonfocal  Psych: Normal affect   Labs    Chemistry Recent Labs Lab 01/04/17 0454 01/05/17 0615 01/06/17 0416  NA 133* 134* 134*  K 3.5 3.2* 3.2*  CL 94* 93* 95*  CO2 27 29 27  GLUCOSE 278* 191* 206*  BUN 84* 82* 84*  CREATININE 4.22* 3.99* 4.00*  CALCIUM 8.9 9.1 8.8*  ALBUMIN 2.5* 2.6* 2.6*  GFRNONAA 10* 10* 10*  GFRAA 11* 12* 12*  ANIONGAP 12 12 12      Hematology Recent Labs Lab 01/04/17 0454 01/05/17 0615 01/06/17 0416  WBC 3.7* 3.7* 3.2*  RBC 2.87* 3.03* 3.00*  HGB 8.3* 8.9* 8.6*  HCT 27.5* 29.0* 28.7*  MCV 95.8 95.7 95.7  MCH 28.9 29.4 28.7  MCHC 30.2 30.7 30.0  RDW 20.1* 19.7* 20.0*  PLT 171 193 178    Cardiac EnzymesNo results for input(s): TROPONINI in the last 168 hours. No results for input(s): TROPIPOC in the last 168 hours.   BNPNo results for input(s): BNP, PROBNP in the last 168 hours.   DDimer No results for input(s): DDIMER in the last 168 hours.   Radiology    No results found.  Cardiac Studies   Previous studies: Echocardiogram 11/11/2016 Study Conclusions - Left ventricle: The cavity size was normal. Wall thickness was increased in a pattern of moderate LVH. Systolic function was moderately to severely reduced. The estimated ejection fraction was in the range of 30% to  35%. Features are consistent with a pseudonormal left ventricular filling pattern, with concomitant abnormal relaxation and increased filling pressure (grade 2 diastolic dysfunction). Doppler parameters are consistent with high ventricular filling pressure. - Regional wall motion abnormality: Hypokinesis of the mid inferior and basal-mid inferolateral myocardium. - Aortic valve: Mildly calcified annulus. Trileaflet; mildly thickened leaflets. Valve area (VTI): 1.57 cm^2. Valve area (Vmax): 1.57 cm^2. Valve area (Vmean): 1.51 cm^2. - Mitral valve: Mildly calcified annulus. Mildly thickened leaflets . There was mild regurgitation. - Left atrium: The atrium was severely dilated. - Right atrium: The atrium was moderately dilated. - Pulmonary arteries: Systolic pressure was moderately increased. PA peak pressure: 44 mm Hg (S).  Left Heart Cath and Coronary Angiography  12/02/2015  Conclusion    Mid LAD lesion, 60 %stenosed.  Prox Cx to Mid Cx lesion, 99 %stenosed.  Mid RCA lesion, 0 %stenosed at site of prior stent  Dist RCA lesion, 30%stenosed at site of prior stent.  Prox RCA lesion, 40 %stenosed.  Ost Cx to Prox Cx lesion, 80 %stenosed.  LV end diastolic pressure is moderately elevated.  1. 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 Patent stents in the mid RCA and distal RCA/PDA 3. Moderate mid LAD disease- unchanged from prior studies. 4. Elevated LVEDP  Plan: medical therapy with diuresis for CHF.       Patient Profile     71 y.o. female with a history of chronic diastolic congestive heart failure, progressive chronic kidney disease, paroxysmal atrial fibrillation, coronary artery disease. She was admitted with worsening congestive heart failure. She has diuresed nicely. Nephrology is watching renal function and hoping to avoid need for dialysis. Pt's husband has just been diagnosed with stage 4 cancer.   Assessment &  Plan    1. Acute on chronic diastolic congestive heart failure: Diuresing with lasix 120 mg IV BID, managed by nephrology. Wt is finally coming down some. Had only  0.7L UOP in last 24h (may not be accurate as pt has some incontinence). Is net negative 5.9L fluid balance. Breathing continues to slowly improve. Still has 1+ lower extremity edema. Diuresis is being managed by nephrology who plans to escalate diuresis to try to get her to a more functional level with hopes of pt keep her from dialysis for  at least several days so she can go home to make arrangements with her husband.  2. CKD:  SCr was 1.71 on admission. It has risen and fluctuated. Has leveled off at 4. Nephrology following. Pt has fistula, but is only 11 week old. There is discussion of possibly needing to start dialysis this admission if renal function gets worse, trying to hold off as pt's husband has been diagnosed with stage IV cancer and she would like to focus on his care for now. Nephrologist was in the room while I was there and the patient understands that she will likely need to go on dialysis very soon, but hopefully after she is able to make arrangements with her husband.   3. Coronary artery disease: Pt has a subtotal occlusion of the left circ by cath in 11/2015. On Plavix, no aspirin while on anticoagulation, statin, BB  4. Paroxysmal atrial fibrillation: Maintaining sinus rhythm on amiodarone and metorpolol. She is on coumadin for stroke risk reduction. Dosing per pharmacy.   5. 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 Hgb A1c 8.8 in 10/2015, was 10.3 in 07/2016.  SSI while in hospital. Management per primary team.  6. Hyperlipidemia: On rosuvastatin 40 mg daily. Last lipid panel in 07/2015 showed LDL 96. LDL today is 41, at goal <70.   For questions or updates, please contact [place] Please consult [email or web address] for contact info under Cardiology/STEMI.      Signed, Daune Perch, NP  01/06/2017, 12:30  PM     Attending Note:   The patient was seen and examined.  Agree with assessment and plan as noted above.  Changes made to the above note as needed.  Patient seen and independently examined with  Pecolia Ades, NP .   We discussed all aspects of the encounter. I agree with the assessment and plan as stated above.  1.  Acute on chronic diastolic congestive heart failure: Patient has been diuresing.  Her creatinine continues to go up.  Her diuretics have been managed by nephrology.  She will likely need to go on dialysis soon.  2.  Coronary artery disease: Patient remains pain-free  3.  Paroxysmal atrial for ablation: Patient remains in normal sinus rhythm.  Nephrology is managing the diuretic. No active cardiac issues.  We will sign off.  Please call for questions   Ramond Dial., MD, [facility] 01/06/2017, 12:58 PM [address],  [place] Pager [phone]

## 2017-01-07 ENCOUNTER — Inpatient Hospital Stay (HOSPITAL_COMMUNITY): Payer: BLUE CROSS/BLUE SHIELD

## 2017-01-07 ENCOUNTER — Encounter (HOSPITAL_COMMUNITY): Payer: Self-pay | Admitting: Interventional Radiology

## 2017-01-07 DIAGNOSIS — E8770 Fluid overload, unspecified: Secondary | ICD-10-CM

## 2017-01-07 DIAGNOSIS — E876 Hypokalemia: Secondary | ICD-10-CM

## 2017-01-07 HISTORY — PX: IR FLUORO GUIDE CV LINE RIGHT: IMG2283

## 2017-01-07 HISTORY — PX: IR US GUIDE VASC ACCESS RIGHT: IMG2390

## 2017-01-07 LAB — CBC
HEMATOCRIT: 29.6 % — AB (ref 36.0–46.0)
HEMOGLOBIN: 8.7 g/dL — AB (ref 12.0–15.0)
MCH: 28.6 pg (ref 26.0–34.0)
MCHC: 29.4 g/dL — ABNORMAL LOW (ref 30.0–36.0)
MCV: 97.4 fL (ref 78.0–100.0)
Platelets: 195 10*3/uL (ref 150–400)
RBC: 3.04 MIL/uL — ABNORMAL LOW (ref 3.87–5.11)
RDW: 20.3 % — AB (ref 11.5–15.5)
WBC: 3.8 10*3/uL — AB (ref 4.0–10.5)

## 2017-01-07 LAB — RENAL FUNCTION PANEL
ANION GAP: 10 (ref 5–15)
Albumin: 2.6 g/dL — ABNORMAL LOW (ref 3.5–5.0)
BUN: 88 mg/dL — ABNORMAL HIGH (ref 6–20)
CHLORIDE: 94 mmol/L — AB (ref 101–111)
CO2: 29 mmol/L (ref 22–32)
Calcium: 8.7 mg/dL — ABNORMAL LOW (ref 8.9–10.3)
Creatinine, Ser: 4.18 mg/dL — ABNORMAL HIGH (ref 0.44–1.00)
GFR, EST AFRICAN AMERICAN: 11 mL/min — AB (ref >60)
GFR, EST NON AFRICAN AMERICAN: 10 mL/min — AB (ref >60)
Glucose, Bld: 212 mg/dL — ABNORMAL HIGH (ref 65–99)
POTASSIUM: 3.1 mmol/L — AB (ref 3.5–5.1)
Phosphorus: 4.3 mg/dL (ref 2.5–4.6)
Sodium: 133 mmol/L — ABNORMAL LOW (ref 135–145)

## 2017-01-07 LAB — GLUCOSE, CAPILLARY
GLUCOSE-CAPILLARY: 196 mg/dL — AB (ref 65–99)
GLUCOSE-CAPILLARY: 151 mg/dL — AB (ref 65–99)
GLUCOSE-CAPILLARY: 133 mg/dL — AB (ref 65–99)
Glucose-Capillary: 107 mg/dL — ABNORMAL HIGH (ref 65–99)

## 2017-01-07 LAB — PROTIME-INR
INR: 3.04
Prothrombin Time: 31.2 seconds — ABNORMAL HIGH (ref 11.4–15.2)

## 2017-01-07 MED ORDER — SODIUM CHLORIDE 0.9 % IV SOLN: 100.0000 mL | INTRAVENOUS | Status: DC | PRN

## 2017-01-07 MED ORDER — ALTEPLASE 2 MG IJ SOLR: 2.0000 mg | Freq: Once | INTRAMUSCULAR | Status: DC | PRN

## 2017-01-07 MED ORDER — HEPARIN SODIUM (PORCINE) 1000 UNIT/ML IJ SOLN
INTRAMUSCULAR | Status: DC | PRN
  Administered 2017-01-07: 2.4 mL via INTRAVENOUS

## 2017-01-07 MED ORDER — LIDOCAINE-PRILOCAINE 2.5-2.5 % EX CREA: 1.0000 "application " | TOPICAL_CREAM | CUTANEOUS | Status: DC | PRN

## 2017-01-07 MED ORDER — HEPARIN SODIUM (PORCINE) 1000 UNIT/ML IJ SOLN
INTRAMUSCULAR | Status: AC
Start: 2017-01-07 — End: 2017-01-07
  Administered 2017-01-07: 1000 [IU] via INTRAVENOUS_CENTRAL
  Filled 2017-01-07: qty 1

## 2017-01-07 MED ORDER — WARFARIN SODIUM 2 MG PO TABS
1.0000 mg | ORAL_TABLET | Freq: Every day | ORAL | Status: DC
  Administered 2017-01-07: 1 mg via ORAL
  Filled 2017-01-07: qty 0.5

## 2017-01-07 MED ORDER — LIDOCAINE HCL 1 % IJ SOLN
INTRAMUSCULAR | Status: AC
  Filled 2017-01-07: qty 20

## 2017-01-07 MED ORDER — SODIUM CHLORIDE 0.9% FLUSH
10.0000 mL | INTRAVENOUS | Status: DC | PRN
  Administered 2017-01-07: 20 mL
  Administered 2017-01-13: 10 mL
  Filled 2017-01-07: qty 40

## 2017-01-07 MED ORDER — PENTAFLUOROPROP-TETRAFLUOROETH EX AERO: 1.0000 "application " | INHALATION_SPRAY | CUTANEOUS | Status: DC | PRN

## 2017-01-07 MED ORDER — LIDOCAINE HCL (PF) 1 % IJ SOLN: 5.0000 mL | INTRAMUSCULAR | Status: DC | PRN

## 2017-01-07 MED ORDER — INSULIN GLARGINE 100 UNIT/ML ~~LOC~~ SOLN
23.0000 [IU] | Freq: Every day | SUBCUTANEOUS | Status: DC
  Administered 2017-01-07 – 2017-01-11 (×5): 23 [IU] via SUBCUTANEOUS
  Filled 2017-01-07 (×5): qty 0.23

## 2017-01-07 MED ORDER — LIDOCAINE HCL 1 % IJ SOLN
INTRAMUSCULAR | Status: DC | PRN
  Administered 2017-01-07: 10 mL

## 2017-01-07 MED ORDER — HEPARIN SODIUM (PORCINE) 1000 UNIT/ML DIALYSIS
1000.0000 [IU] | INTRAMUSCULAR | Status: DC | PRN
  Administered 2017-01-07: 1000 [IU] via INTRAVENOUS_CENTRAL
  Filled 2017-01-07 (×2): qty 1

## 2017-01-07 NOTE — Progress Notes (Signed)
Patient arrived to unit by bed.  Reviewed treatment plan and this RN agrees with plan.  Report received from bedside RN, Nira Conn.  Consent obtained.  Patient A & O X 4.   Lung sounds diminished and coarse to ausculation in all fields. BLE 1+ pitting edema, swollen. Cardiac:  NSR.  Removed caps and cleansed RIJ catheter with chlorhedxidine.  Aspirated ports of heparin and flushed them with saline per protocol.  Connected and secured lines, initiated treatment at 1815.  UF Goal of 1500 mL and net fluid removal 1 L.  Will continue to monitor.

## 2017-01-07 NOTE — Progress Notes (Signed)
Patient stable, no complaints or concerns. Patient prefers using purewick at [facility], but can ambulate to [facility] with one assist and walker.

## 2017-01-07 NOTE — Progress Notes (Signed)
PROGRESS NOTE    [name]  ONG:[id number] DOB: 05-25-1945 DOA: 12/23/2016 PCP: [name], MD   Brief Narrative: [name] is a 71 y.o. female with medical history significant for insulin-dependent diabetes mellitus, chronic kidney disease stage IV, chronic combined systolic/diastolic CHF, chronic atrial fibrillation, and coronary artery disease, presented to the ED at the direction of her nephrologist for evaluation of swelling and fatigue. She has been followed by cardiology and nephrology for worsening fluid status complicating worsening kidney function. She continued to be volume overloaded despite increase in her diuretics recently. Admitted with acute on chronic diastolic heart failure secondary to Acute on chronic renal failure.  Nephrology and cardiology following. Nephrology adjusting diuretics and hoping to avoid initiating hemodialysis.   Assessment & Plan:   Principal Problem:   CKD (chronic kidney disease), stage IV (HCC) Active Problems:   Essential hypertension   Coronary artery disease   Acute on chronic combined systolic and diastolic CHF (congestive heart failure) (HCC)   Elevated troponin   DM type 2 causing vascular disease (HCC)   Pleural effusion on right   Anemia in chronic kidney disease   Atrial fibrillation (HCC)   Hypokalemia   Hypervolemia   Rhonchi   Acute on chronic combined systolic/diastolic CHF  Echo 06/09/99, EF 02-72%, grade 2 diastolic dysfunction, mild MR, severe LAE Nephrology helping with diuresis and on 10/31 have changed oral Lasix to IV Lasix 120 mg twice a day.  - 6.4 L since admission. 1.6 L urine output documented yesterday but patient repeatedly states that not all of her urine output is being charted in EMR Weight down from 211 pounds on 10/19-194 pounds on 11/2. Holding Metolazone due to increase cr.  Patient was not making expected progress despite aggressive IV diuresis. Nephrology discussed in detail with patient  and patient has agreed to initiate HD. Volume management across HD.  Acute kidney injury superimposed on CKD stage IV  Underwent AV graft placement 10-26. Possible mild steal post op.  Nephrology following. She was started on high-dose IV Lasix 120 mg twice a day on 10/31, creatinine fluctuating but essentially in the 4 range. Patient still has significant volume on board and not sure if we will be able to remove by aggressive diuresis alone without worsening her renal functions. On 11/2, I had long discussion with patient regarding the current complicated situation and her desire to go home. I discussed ultimate need for HD if her volume status doesn't improve or renal functions worsen. She was willing to think about it. I discussed with Dr. Hollie Salk. Nephrology hoping to avoid initiating HD this admission so that patient can focus on her husband was just diagnosed with stage IV cancer. - Discussed with Dr. Hollie Salk who has discussed extensively with patient and she now has right IJ HD catheter and plans to start HD this afternoon.  Paroxysmal A fib;   - Remains in sinus rhythm. Cardiology following. Continue amiodarone, metoprolol, Plavix and anticoagulated on Coumadin. Remains in SB/SR.  CAD,  Asymptomatic of chest pain. Continue Plavix, Imdur, metoprolol and statins.  Diabetes; type 2;  CBG is uncontrolled and mildly fluctuating. Currently on Lantus 20 units at bedtime and NovoLog SSI, moderate sensitivity. Continue current regimen without change. Will increase Lantus to 23 units at bedtime.  Hypokalemia Management per nephrology.  Anemia Stable. Status post 1 dose of Feraheme.  Leukopenia Unclear etiology. Follow CBCs.  Essential hypertension Controlled   DVT prophylaxis: Anticoagulated on Coumadin Code Status: Full code Family Communication:  None at bedside. Disposition Plan: DC to? SNF when medically improved and stable. Not medically stable for discharge.   Consultants:    Nephrology   Cardiology  IR   Procedures:  Right IJ HD catheter placed by IR on 11/3   Antimicrobials:  None   Subjective: Seen this morning after right IJ HD catheter was placed. Feels down that she has to go on HD. No dyspnea or chest pain reported. Leg swelling persistent.   Objective: Vitals:   01/06/17 1125 01/06/17 2047 01/07/17 0500 01/07/17 1151  BP: (!) 108/48 (!) 116/44 (!) 101/49 123/76  Pulse: (!) 58 60 (!) 57 69  Resp: 18 18 18    Temp: 97.8 F (36.6 C) 98.5 F (36.9 C) 98.2 F (36.8 C) 97.6 F (36.4 C)  TempSrc: Oral Oral Oral Oral  SpO2: 98% 100% 98% 100%  Weight:   90.8 kg (200 lb 3.2 oz)   Height:        Intake/Output Summary (Last 24 hours) at 01/07/17 1435 Last data filed at 01/07/17 1429  Gross per 24 hour  Intake           2644.5 ml  Output              750 ml  Net           1894.5 ml   Filed Weights   01/05/17 0638 01/06/17 0652 01/07/17 0500  Weight: 89.9 kg (198 lb 4.8 oz) 88.3 kg (194 lb 10.7 oz) 90.8 kg (200 lb 3.2 oz)    Examination:  General exam; pleasant elderly female, lying comfortably propped up in bed Respiratory system: Occasional basal crackles but otherwise clear to auscultation. No increased work of breathing. Stable without change. Cardiovascular system: S1 and S2 heard, RRR. No JVD or murmurs. 2+ pitting bilateral leg edema. No significant improvement in lower extremity edema. Telemetry personally reviewed: SB in the 50s-NSR. Stable without change. Gastrointestinal system:  Nondistended, soft and nontender. Normal bowel sounds heard. Central nervous system: Alert and oriented 3. No focal neurological deficits. Extremities: symmetric power. Skin: no rash    Data Reviewed: I have personally reviewed following labs and imaging studies  CBC:  Recent Labs Lab 01/03/17 0606 01/04/17 0454 01/05/17 0615 01/06/17 0416 01/07/17 0649  WBC 4.2 3.7* 3.7* 3.2* 3.8*  HGB 8.5* 8.3* 8.9* 8.6* 8.7*  HCT 28.5* 27.5*  29.0* 28.7* 29.6*  MCV 96.3 95.8 95.7 95.7 97.4  PLT 185 171 193 178 834   Basic Metabolic Panel:  Recent Labs Lab 01/03/17 0606 01/04/17 0454 01/05/17 0615 01/06/17 0416 01/07/17 0649  NA 132* 133* 134* 134* 133*  K 3.3* 3.5 3.2* 3.2* 3.1*  CL 93* 94* 93* 95* 94*  CO2 27 27 29 27 29   GLUCOSE 201* 278* 191* 206* 212*  BUN 82* 84* 82* 84* 88*  CREATININE 4.16* 4.22* 3.99* 4.00* 4.18*  CALCIUM 9.1 8.9 9.1 8.8* 8.7*  PHOS 4.1 4.2 3.8 4.2 4.3   GFR: Estimated Creatinine Clearance: 14.3 mL/min (A) (by C-G formula based on SCr of 4.18 mg/dL (H)). Liver Function Tests:  Recent Labs Lab 01/03/17 0606 01/04/17 0454 01/05/17 0615 01/06/17 0416 01/07/17 0649  ALBUMIN 2.5* 2.5* 2.6* 2.6* 2.6*   No results for input(s): LIPASE, AMYLASE in the last 168 hours. No results for input(s): AMMONIA in the last 168 hours. Coagulation Profile:  Recent Labs Lab 01/03/17 0606 01/04/17 0454 01/05/17 0615 01/06/17 0416 01/07/17 0649  INR 2.62 2.82 2.82 2.64 3.04   Cardiac Enzymes: No results  for input(s): CKTOTAL, CKMB, CKMBINDEX, TROPONINI in the last 168 hours. BNP (last 3 results) No results for input(s): PROBNP in the last 8760 hours. HbA1C: No results for input(s): HGBA1C in the last 72 hours. CBG:  Recent Labs Lab 01/06/17 1123 01/06/17 1633 01/06/17 2121 01/07/17 0741 01/07/17 1127  GLUCAP 154* 261* 243* 196* 151*   Lipid Profile:  Recent Labs  01/06/17 0416  CHOL 100  HDL 47  LDLCALC 41  TRIG 62  CHOLHDL 2.1   Thyroid Function Tests: No results for input(s): TSH, T4TOTAL, FREET4, T3FREE, THYROIDAB in the last 72 hours. Anemia Panel: No results for input(s): VITAMINB12, FOLATE, FERRITIN, TIBC, IRON, RETICCTPCT in the last 72 hours. Sepsis Labs: No results for input(s): PROCALCITON, LATICACIDVEN in the last 168 hours.  Recent Results (from the past 240 hour(s))  Surgical pcr screen     Status: None   Collection Time: 12/28/16  7:26 PM  Result Value  Ref Range Status   MRSA, PCR NEGATIVE NEGATIVE Final   Staphylococcus aureus NEGATIVE NEGATIVE Final    Comment: (NOTE) The Xpert SA Assay (FDA approved for NASAL specimens in patients 22 years of age and older), is one component of a comprehensive surveillance program. It is not intended to diagnose infection nor to guide or monitor treatment.          Radiology Studies: Ir Fluoro Guide Cv Line Right  Result Date: 01/07/2017 INDICATION: End-stage renal disease EXAM: TEMPORARY RIGHT JUGULAR DIALYSIS CATHETER PLACEMENT MEDICATIONS: None ANESTHESIA/SEDATION: None FLUOROSCOPY TIME:  Fluoroscopy Time: 1 minutes  seconds ( mGy). COMPLICATIONS: None immediate. PROCEDURE: Informed written consent was obtained from the patient after a thorough discussion of the procedural risks, benefits and alternatives. All questions were addressed. Maximal Sterile Barrier Technique was utilized including caps, mask, sterile gowns, sterile gloves, sterile drape, hand hygiene and skin antiseptic. A timeout was performed prior to the initiation of the procedure. The right neck was prepped and draped in a sterile fashion. 1% lidocaine was utilized for local anesthesia. Under sonographic guidance, a micropuncture needle was inserted into the right jugular vein which was removed over a 018 wire which was up sized to an Amplatz. The dialysis catheter dilator followed by the temporary dialysis catheter were advanced over the Amplatz wire. It was flushed and sewn in place. Heparin was instilled. FINDINGS: The tip of the right jugular temporary dialysis catheter is at the cavoatrial junction. IMPRESSION: Successful right jugular temporary dialysis catheter placement with its tip at the cavoatrial junction. Electronically Signed   By: Marybelle Killings M.D.   On: 01/07/2017 11:55   Ir US Guide Vasc Access Right  Result Date: 01/07/2017 INDICATION: End-stage renal disease EXAM: TEMPORARY RIGHT JUGULAR DIALYSIS CATHETER PLACEMENT  MEDICATIONS: None ANESTHESIA/SEDATION: None FLUOROSCOPY TIME:  Fluoroscopy Time: 1 minutes  seconds ( mGy). COMPLICATIONS: None immediate. PROCEDURE: Informed written consent was obtained from the patient after a thorough discussion of the procedural risks, benefits and alternatives. All questions were addressed. Maximal Sterile Barrier Technique was utilized including caps, mask, sterile gowns, sterile gloves, sterile drape, hand hygiene and skin antiseptic. A timeout was performed prior to the initiation of the procedure. The right neck was prepped and draped in a sterile fashion. 1% lidocaine was utilized for local anesthesia. Under sonographic guidance, a micropuncture needle was inserted into the right jugular vein which was removed over a 018 wire which was up sized to an Amplatz. The dialysis catheter dilator followed by the temporary dialysis catheter were advanced over the Amplatz wire.  It was flushed and sewn in place. Heparin was instilled. FINDINGS: The tip of the right jugular temporary dialysis catheter is at the cavoatrial junction. IMPRESSION: Successful right jugular temporary dialysis catheter placement with its tip at the cavoatrial junction. Electronically Signed   By: Marybelle Killings M.D.   On: 01/07/2017 11:55        Scheduled Meds: . ALPRAZolam  0.5 mg Oral QHS  . amiodarone  200 mg Oral Daily  . calcitRIOL  0.5 mcg Oral Daily  . clopidogrel  75 mg Oral Daily  . cyclobenzaprine  5 mg Oral QHS  . darbepoetin (ARANESP) injection - NON-DIALYSIS  150 mcg Subcutaneous Q Mon-1800  . fluticasone  1 spray Each Nare Daily  . heparin      . insulin aspart  0-15 Units Subcutaneous TID WC  . insulin aspart  0-5 Units Subcutaneous QHS  . insulin glargine  20 Units Subcutaneous QHS  . isosorbide mononitrate  30 mg Oral BID  . latanoprost  1 drop Both Eyes QHS  . lidocaine      . loratadine  10 mg Oral Daily  . meclizine  12.5 mg Oral BID  . mouth rinse  15 mL Mouth Rinse BID  .  metoprolol succinate  25 mg Oral QHS  . pantoprazole  40 mg Oral Daily  . rosuvastatin  40 mg Oral q1800  . senna-docusate  2 tablet Oral BID  . sodium chloride flush  3 mL Intravenous Q12H  . warfarin  2 mg Oral q1800  . Warfarin - Pharmacist Dosing Inpatient   Does not apply q1800   Continuous Infusions: . sodium chloride    . sodium chloride 10 mL/hr at 12/30/16 1254  . sodium chloride    . sodium chloride    . furosemide 160 mg (01/07/17 1213)     LOS: 15 days    Time spent: 35 minutes.     Vernell Leep, MD, FACP, FHM. [facility] Pager [phone]  If 7PM-7AM, please contact night-coverage [email or web address] Password [email or web address] 01/07/2017, 2:35 PM

## 2017-01-07 NOTE — Progress Notes (Signed)
Patient has no peripehral IV access, IV team consulted x2 with no success. Received verbal orders from Dr. Jonnie Finner that HD catheter could be used for lab draws and IV fluids. IV RN will initiate.

## 2017-01-07 NOTE — Progress Notes (Signed)
ANTICOAGULATION CONSULT NOTE - Follow Up Consult  Pharmacy Consult for Coumadin Indication: atrial fibrillation  Allergies  Allergen Reactions  . Ace Inhibitors Cough  . Amlodipine Swelling    UNSPECIFIED EDEMA   . Codeine Rash    Patient Measurements: Height: 5\' 8"  (172.7 cm) Weight: 200 lb 3.2 oz (90.8 kg) IBW/kg (Calculated) : 63.9  Vital Signs: Temp: 97.6 F (36.4 C) (11/03 1151) Temp Source: Oral (11/03 1151) BP: 123/76 (11/03 1151) Pulse Rate: 69 (11/03 1151)  Labs:  Recent Labs  01/05/17 0615 01/06/17 0416 01/07/17 0649  HGB 8.9* 8.6* 8.7*  HCT 29.0* 28.7* 29.6*  PLT 193 178 195  LABPROT 29.4* 28.0* 31.2*  INR 2.82 2.64 3.04  CREATININE 3.99* 4.00* 4.18*    Estimated Creatinine Clearance: 14.3 mL/min (A) (by C-G formula based on SCr of 4.18 mg/dL (H)).  Assessment:  Anticoag: Warfarin pta for h/o afib;  INR 3 today. --INR trend, very sensitive to warfarin --Possible DDI: amiodarone but on PTA w/warf & amio dose is lower now ,   Goal of Therapy:  INR 2-3 Monitor platelets by anticoagulation protocol: Yes   Plan:  Decrease Warfarin 1 mg po daily Monitor INR daily   Cookie Pore S. Alford Highland, PharmD, BCPS Clinical Staff Pharmacist Pager [phone]  [name] 01/07/2017,3:07 PM

## 2017-01-07 NOTE — Progress Notes (Signed)
S: not making headway on volume.  Needs to start dialysis.  Long discussion with pt this AM.    O:BP (!) 101/49 (BP Location: Right Arm)   Pulse (!) 57   Temp 98.2 F (36.8 C) (Oral)   Resp 18   Ht 5\' 8"  (1.727 m)   Wt 90.8 kg (200 lb 3.2 oz)   SpO2 98%   BMI 30.44 kg/m   Intake/Output Summary (Last 24 hours) at 01/07/17 0818 Last data filed at 01/07/17 0600  Gross per 24 hour  Intake           2644.5 ml  Output             1070 ml  Net           1574.5 ml   Intake/Output: I/O last 3 completed shifts: In: 2744.5 [P.O.:780; I.V.:1832.5; IV Piggyback:132] Out: 2703 [JKKXF:8182]  Intake/Output this shift:  No intake/output data recorded. Weight change: 2.51 kg (5 lb 8.5 oz)  Gen: NAD HEENT: + JVD CVS: Regular rate and rhythm  Resp: bilaterally decreased breath sounds at bases,with few basal crackles. Abd: soft, non tender, bowel sounds positive. Ext: 3+ lower extremity edema, unimproved and unchanged, left AVG with good bruit.   Recent Labs Lab 01/01/17 0436 01/02/17 0535 01/03/17 0606 01/04/17 0454 01/05/17 0615 01/06/17 0416  NA 133* 134* 132* 133* 134* 134*  K 4.3 3.9 3.3* 3.5 3.2* 3.2*  CL 94* 94* 93* 94* 93* 95*  CO2 26 29 27 27 29 27   GLUCOSE 245* 195* 201* 278* 191* 206*  BUN 75* 77* 82* 84* 82* 84*  CREATININE 4.06* 4.01* 4.16* 4.22* 3.99* 4.00*  ALBUMIN 2.5* 2.6* 2.5* 2.5* 2.6* 2.6*  CALCIUM 9.1 9.2 9.1 8.9 9.1 8.8*  PHOS 4.1 3.9 4.1 4.2 3.8 4.2   Liver Function Tests:  Recent Labs Lab 01/04/17 0454 01/05/17 0615 01/06/17 0416  ALBUMIN 2.5* 2.6* 2.6*   No results for input(s): LIPASE, AMYLASE in the last 168 hours. No results for input(s): AMMONIA in the last 168 hours. CBC:  Recent Labs Lab 01/03/17 0606 01/04/17 0454 01/05/17 0615 01/06/17 0416 01/07/17 0649  WBC 4.2 3.7* 3.7* 3.2* 3.8*  HGB 8.5* 8.3* 8.9* 8.6* 8.7*  HCT 28.5* 27.5* 29.0* 28.7* 29.6*  MCV 96.3 95.8 95.7 95.7 97.4  PLT 185 171 193 178 195   Cardiac Enzymes: No  results for input(s): CKTOTAL, CKMB, CKMBINDEX, TROPONINI in the last 168 hours. CBG:  Recent Labs Lab 01/06/17 0744 01/06/17 1123 01/06/17 1633 01/06/17 2121 01/07/17 0741  GLUCAP 169* 154* 261* 243* 196*    Iron Studies: No results for input(s): IRON, TIBC, TRANSFERRIN, FERRITIN in the last 72 hours. Studies/Results: No results found. . ALPRAZolam  0.5 mg Oral QHS  . amiodarone  200 mg Oral Daily  . calcitRIOL  0.5 mcg Oral Daily  . clopidogrel  75 mg Oral Daily  . cyclobenzaprine  5 mg Oral QHS  . darbepoetin (ARANESP) injection - NON-DIALYSIS  150 mcg Subcutaneous Q Mon-1800  . fluticasone  1 spray Each Nare Daily  . insulin aspart  0-15 Units Subcutaneous TID WC  . insulin aspart  0-5 Units Subcutaneous QHS  . insulin glargine  20 Units Subcutaneous QHS  . isosorbide mononitrate  30 mg Oral BID  . latanoprost  1 drop Both Eyes QHS  . loratadine  10 mg Oral Daily  . meclizine  12.5 mg Oral BID  . mouth rinse  15 mL Mouth Rinse BID  . metoprolol succinate  25 mg Oral QHS  . pantoprazole  40 mg Oral Daily  . rosuvastatin  40 mg Oral q1800  . senna-docusate  2 tablet Oral BID  . sodium chloride flush  3 mL Intravenous Q12H  . warfarin  2 mg Oral q1800  . Warfarin - Pharmacist Dosing Inpatient   Does not apply q1800    BMET    Component Value Date/Time   NA 134 (L) 01/06/2017 0416   NA 141 02/18/2016 0937   K 3.2 (L) 01/06/2017 0416   CL 95 (L) 01/06/2017 0416   CO2 27 01/06/2017 0416   GLUCOSE 206 (H) 01/06/2017 0416   BUN 84 (H) 01/06/2017 0416   BUN 53 (H) 02/18/2016 0937   CREATININE 4.00 (H) 01/06/2017 0416   CREATININE 1.73 (H) 10/08/2013 1024   CALCIUM 8.8 (L) 01/06/2017 0416   GFRNONAA 10 (L) 01/06/2017 0416   GFRAA 12 (L) 01/06/2017 0416   CBC    Component Value Date/Time   WBC 3.8 (L) 01/07/2017 0649   RBC 3.04 (L) 01/07/2017 0649   HGB 8.7 (L) 01/07/2017 0649   HCT 29.6 (L) 01/07/2017 0649   PLT 195 01/07/2017 0649   MCV 97.4 01/07/2017 0649    MCH 28.6 01/07/2017 0649   MCHC 29.4 (L) 01/07/2017 0649   RDW 20.3 (H) 01/07/2017 0649   LYMPHSABS 0.8 12/27/2016 0524   MONOABS 0.4 12/27/2016 0524   EOSABS 0.1 12/27/2016 0524   BASOSABS 0.0 12/27/2016 0524     Assessment/Plan:  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  Needs to start dialysis. Long discussion today.  IR for HD catheter (tunneled vs nontunneled depending on INR) then first HD afterwards.  Will initiate CLIP.  HFrEF. Cardiology is following,remained volume overloaded.  Anemia. Hemoglobin stable,  had 1 dose of Feraheme. - Aranesp 150 per week.  Hypertension. BP s a little soft.  Afib. Rate controlled with amiodarone and beta blocker, on warfarin DM. per primary  Madelon Lips MD [place] pgr [phone]

## 2017-01-07 NOTE — Progress Notes (Signed)
Patient stable during 7 to 7 p shift, VSS, no complaints.  Family at bedside for part of shift.

## 2017-01-07 NOTE — Progress Notes (Signed)
HD tx completed @ 2045 w/o problem, UF goal met, blood rinsed back, VSS, report called to Jaquetta Thomas, RN 

## 2017-01-07 NOTE — Procedures (Signed)
RIJV HD cath EBL 0 COMP 0

## 2017-01-08 ENCOUNTER — Other Ambulatory Visit: Payer: Self-pay

## 2017-01-08 DIAGNOSIS — N186 End stage renal disease: Secondary | ICD-10-CM

## 2017-01-08 LAB — RENAL FUNCTION PANEL
ALBUMIN: 2.2 g/dL — AB (ref 3.5–5.0)
Anion gap: 9 (ref 5–15)
BUN: 51 mg/dL — AB (ref 6–20)
CHLORIDE: 102 mmol/L (ref 101–111)
CO2: 26 mmol/L (ref 22–32)
CREATININE: 2.82 mg/dL — AB (ref 0.44–1.00)
Calcium: 7.6 mg/dL — ABNORMAL LOW (ref 8.9–10.3)
GFR calc Af Amer: 18 mL/min — ABNORMAL LOW (ref >60)
GFR, EST NON AFRICAN AMERICAN: 16 mL/min — AB (ref >60)
GLUCOSE: 255 mg/dL — AB (ref 65–99)
Phosphorus: 3.1 mg/dL (ref 2.5–4.6)
Potassium: 3.2 mmol/L — ABNORMAL LOW (ref 3.5–5.1)
Sodium: 137 mmol/L (ref 135–145)

## 2017-01-08 LAB — HEPATITIS B SURFACE ANTIGEN: HEP B S AG: NEGATIVE

## 2017-01-08 LAB — GLUCOSE, CAPILLARY
GLUCOSE-CAPILLARY: 219 mg/dL — AB (ref 65–99)
GLUCOSE-CAPILLARY: 98 mg/dL (ref 65–99)
GLUCOSE-CAPILLARY: 213 mg/dL — AB (ref 65–99)
Glucose-Capillary: 217 mg/dL — ABNORMAL HIGH (ref 65–99)
Glucose-Capillary: 302 mg/dL — ABNORMAL HIGH (ref 65–99)

## 2017-01-08 LAB — PROTIME-INR
INR: 3.17
Prothrombin Time: 32.2 seconds — ABNORMAL HIGH (ref 11.4–15.2)

## 2017-01-08 LAB — CBC
HCT: 29.1 % — ABNORMAL LOW (ref 36.0–46.0)
Hemoglobin: 8.8 g/dL — ABNORMAL LOW (ref 12.0–15.0)
MCH: 29.8 pg (ref 26.0–34.0)
MCHC: 30.2 g/dL (ref 30.0–36.0)
MCV: 98.6 fL (ref 78.0–100.0)
PLATELETS: 186 10*3/uL (ref 150–400)
RBC: 2.95 MIL/uL — ABNORMAL LOW (ref 3.87–5.11)
RDW: 20.8 % — AB (ref 11.5–15.5)
WBC: 3.2 10*3/uL — AB (ref 4.0–10.5)

## 2017-01-08 LAB — HEPATITIS B CORE ANTIBODY, TOTAL: Hep B Core Total Ab: NEGATIVE

## 2017-01-08 LAB — HEPATITIS B SURFACE ANTIBODY,QUALITATIVE: Hep B S Ab: NONREACTIVE

## 2017-01-08 NOTE — Progress Notes (Signed)
ANTICOAGULATION CONSULT NOTE - Follow Up Consult  Pharmacy Consult for Coumadin Indication: atrial fibrillation  Allergies  Allergen Reactions  . Ace Inhibitors Cough  . Amlodipine Swelling    UNSPECIFIED EDEMA   . Codeine Rash    Patient Measurements: Height: 5\' 8"  (172.7 cm) Weight: 196 lb 3.4 oz (89 kg) IBW/kg (Calculated) : 63.9  Vital Signs: Temp: 98.7 F (37.1 C) (11/04 0836) Temp Source: Oral (11/04 0836) BP: 109/54 (11/04 1030) Pulse Rate: 56 (11/04 1030)  Labs: Recent Labs    01/06/17 0416 01/07/17 0649 01/08/17 0519  HGB 8.6* 8.7* 8.8*  HCT 28.7* 29.6* 29.1*  PLT 178 195 186  LABPROT 28.0* 31.2* 32.2*  INR 2.64 3.04 3.17  CREATININE 4.00* 4.18* 2.82*    Estimated Creatinine Clearance: 21 mL/min (A) (by C-G formula based on SCr of 2.82 mg/dL (H)).  Assessment:  Anticoag: Warfarin pta for h/o afib;  INR 3>3.1 today. CBC stable --INR trend, very sensitive to warfarin --Possible DDI: amiodarone but on PTA w/warf & amio dose is lower now ,   Goal of Therapy:  INR 2-3 Monitor platelets by anticoagulation protocol: Yes   Plan:  Hold warfarin tonight Monitor INR daily   [name], PharmD, [place] Clinical Staff Pharmacist Pager [phone]  [name] 01/08/2017,11:06 AM

## 2017-01-08 NOTE — Progress Notes (Signed)
  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  Will initiate CLIP.  Needs her HD catheter tunneled before d/c.  Stopping high-dose Lasix for now  HFrEF. Cardiology is following  Anemia. Hemoglobin stable, had 1 dose of Feraheme.  Aranesp 150 per week.  Hypertension. BPs a little soft.  Afib. Rate controlled with amiodarone and beta blocker, on warfarin  DM. per primary  Madelon Lips MD [place] pgr [phone]    Subjective:    HD #1 yesterday.  Tolerated well.  HD #2 today.     Objective:   BP (!) 109/54   Pulse (!) 56   Temp 98.7 F (37.1 C) (Oral)   Resp 18   Ht 5\' 8"  (1.727 m)   Wt 89 kg (196 lb 3.4 oz)   SpO2 99%   BMI 29.83 kg/m   Physical Exam: Gen: NAD HEENT: + JVD. R IJ nontunneled HD catheter in place CVS: Regular rate and rhythm  Resp: bilaterally decreased breath sounds at bases,with few basal crackles. Abd: soft, non tender, bowel sounds positive. Ext: 3+ lower extremity edema, slightly improved, left AVG with good bruit.    Labs: BMET Recent Labs  Lab 01/02/17 0535 01/03/17 0606 01/04/17 0454 01/05/17 0615 01/06/17 0416 01/07/17 0649 01/08/17 0519  NA 134* 132* 133* 134* 134* 133* 137  K 3.9 3.3* 3.5 3.2* 3.2* 3.1* 3.2*  CL 94* 93* 94* 93* 95* 94* 102  CO2 29 27 27 29 27 29 26   GLUCOSE 195* 201* 278* 191* 206* 212* 255*  BUN 77* 82* 84* 82* 84* 88* 51*  CREATININE 4.01* 4.16* 4.22* 3.99* 4.00* 4.18* 2.82*  CALCIUM 9.2 9.1 8.9 9.1 8.8* 8.7* 7.6*  PHOS 3.9 4.1 4.2 3.8 4.2 4.3 3.1   CBC Recent Labs  Lab 01/05/17 0615 01/06/17 0416 01/07/17 0649 01/08/17 0519  WBC 3.7* 3.2* 3.8* 3.2*  HGB 8.9* 8.6* 8.7* 8.8*  HCT 29.0* 28.7* 29.6* 29.1*  MCV 95.7 95.7 97.4 98.6  PLT 193 178 195 186    @IMGRELPRIORS @ Medications:    . ALPRAZolam  0.5 mg Oral QHS  . amiodarone  200 mg Oral Daily  . calcitRIOL  0.5 mcg Oral Daily   . clopidogrel  75 mg Oral Daily  . cyclobenzaprine  5 mg Oral QHS  . darbepoetin (ARANESP) injection - NON-DIALYSIS  150 mcg Subcutaneous Q Mon-1800  . fluticasone  1 spray Each Nare Daily  . insulin aspart  0-15 Units Subcutaneous TID WC  . insulin aspart  0-5 Units Subcutaneous QHS  . insulin glargine  23 Units Subcutaneous QHS  . isosorbide mononitrate  30 mg Oral BID  . latanoprost  1 drop Both Eyes QHS  . loratadine  10 mg Oral Daily  . meclizine  12.5 mg Oral BID  . mouth rinse  15 mL Mouth Rinse BID  . metoprolol succinate  25 mg Oral QHS  . pantoprazole  40 mg Oral Daily  . rosuvastatin  40 mg Oral q1800  . senna-docusate  2 tablet Oral BID  . sodium chloride flush  3 mL Intravenous Q12H  . Warfarin - Pharmacist Dosing Inpatient   Does not apply [place], MD [facility] pgr [phone] 01/08/2017, 11:36 AM

## 2017-01-08 NOTE — Procedures (Signed)
Patient seen and examined on Hemodialysis. QB 200 via nontunneled HD catheter UF goal 1L  Tolerating well.  Treatment adjusted as needed.  Madelon Lips MD [facility] pgr [phone] 11:40 AM

## 2017-01-08 NOTE — Progress Notes (Signed)
PROGRESS NOTE    [name]  BOF:[id number] DOB: 08-02-1945 DOA: 12/23/2016 PCP: [name], MD   Brief Narrative: [name] is a 71 y.o. female with medical history significant for insulin-dependent diabetes mellitus, chronic kidney disease stage IV, chronic combined systolic/diastolic CHF, chronic atrial fibrillation, and coronary artery disease, presented to the ED at the direction of her nephrologist for evaluation of swelling and fatigue. She has been followed by cardiology and nephrology for worsening fluid status complicating worsening kidney function. She continued to be volume overloaded despite increase in her diuretics recently. Admitted with acute on chronic diastolic heart failure secondary to Acute on chronic renal failure.  Nephrology and cardiology following.  Unable to manage volume status despite aggressive IV diuresis.  Now deemed ESRD.  Temporary right IJ HD catheter placed and HD started on 01/07/17.     Assessment & Plan:   Principal Problem:   CKD (chronic kidney disease), stage IV (HCC) Active Problems:   Essential hypertension   Coronary artery disease   Acute on chronic combined systolic and diastolic CHF (congestive heart failure) (HCC)   Elevated troponin   DM type 2 causing vascular disease (HCC)   Pleural effusion on right   Anemia in chronic kidney disease   Atrial fibrillation (HCC)   Hypokalemia   Hypervolemia   Rhonchi   Acute on chronic combined systolic/diastolic CHF  Echo 09/10/80, EF 42-35%, grade 2 diastolic dysfunction, mild MR, severe LAE Nephrology and cardiology following. Despite extensive efforts to try to manage patient with medication/aggressive IV diuresis, patient did not make satisfactory progress, unable to offload fluid and creatinine was worsening. After multiple and extensive discussions with patient, finally started on HD on 11/3. Volume management across dialysis.  Acute kidney injury superimposed on CKD stage  IV, now new ESRD Underwent AV graft placement 10-26. Possible mild steal post op.  Despite extensive efforts to try to manage patient with medication/aggressive IV diuresis, patient did not make satisfactory progress, unable to offload fluid and creatinine was worsening. After multiple and extensive discussions with patient, finally started on HD on 11/3. Volume management across dialysis. Nephrology initiating CLIP, patient will need tunneled HD catheter prior to discharge.  High-dose IV Lasix discontinued.  Day 2/3 of continuous HD.  Paroxysmal A fib;   - Cardiology following. Continue amiodarone, metoprolol, Plavix and anticoagulated on Coumadin. Remains in SB/SR.  CAD,  Asymptomatic of chest pain. Continue Plavix, Imdur, metoprolol and statins.  Diabetes; type 2;  CBG is uncontrolled and mildly fluctuating. Currently on Lantus 20 units at bedtime and NovoLog SSI, moderate sensitivity. Continue current regimen without change.  Increased Lantus to 23 units at bedtime.  CBG better.  Hypokalemia Management per nephrology.  Anemia Stable. Status post 1 dose of Feraheme.  Leukopenia Unclear etiology.  Stable.  Follow CBCs.  Essential hypertension Controlled   DVT prophylaxis: Anticoagulated on Coumadin Code Status: Full code Family Communication: None at bedside. Disposition Plan: Not medically stable for discharge.  DC likely home when stable.   Consultants:   Nephrology   Cardiology  IR   Procedures:  Right IJ NON-TUNNELED HD catheter placed by IR on 11/3   Antimicrobials:  None   Subjective: Patient was seen this morning across HD.  She stated that she was feeling "okay".  Denied dyspnea or chest pain.  Feels that her leg swelling is slowly improving, right more than the left.   Objective: Vitals:   01/08/17 1030 01/08/17 1100 01/08/17 1136 01/08/17 1238  BP: Marland Kitchen)  109/54 (!) 90/52 (!) 113/52 (!) 93/51  Pulse: (!) 56 (!) 56 (!) 55 (!) 59  Resp:   18     Temp:   98.7 F (37.1 C)   TempSrc:   Oral   SpO2:    100%  Weight:      Height:        Intake/Output Summary (Last 24 hours) at 01/08/2017 1330 Last data filed at 01/08/2017 0647 Gross per 24 hour  Intake 1039.83 ml  Output 1954 ml  Net -914.17 ml   Filed Weights   01/07/17 2052 01/08/17 0519 01/08/17 0836  Weight: 89.8 kg (197 lb 15.6 oz) 88.8 kg (195 lb 11.2 oz) 89 kg (196 lb 3.4 oz)    Examination:  General exam; pleasant elderly female, lying comfortably propped up in bed Respiratory system: Clear to auscultation.  No increased work of breathing. Cardiovascular system: S1 and S2 heard, heart RRR.  No JVD or murmurs.  2+ pitting bilateral leg edema.  Telemetry: Sinus bradycardia in the 50s. Gastrointestinal system:  Nondistended, soft and nontender. Normal bowel sounds heard.  Stable. Central nervous system: Alert and oriented 3. No focal neurological deficits. Extremities: symmetric power. Skin: no rash    Data Reviewed: I have personally reviewed following labs and imaging studies  CBC: Recent Labs  Lab 01/04/17 0454 01/05/17 0615 01/06/17 0416 01/07/17 0649 01/08/17 0519  WBC 3.7* 3.7* 3.2* 3.8* 3.2*  HGB 8.3* 8.9* 8.6* 8.7* 8.8*  HCT 27.5* 29.0* 28.7* 29.6* 29.1*  MCV 95.8 95.7 95.7 97.4 98.6  PLT 171 193 178 195 062   Basic Metabolic Panel: Recent Labs  Lab 01/04/17 0454 01/05/17 0615 01/06/17 0416 01/07/17 0649 01/08/17 0519  NA 133* 134* 134* 133* 137  K 3.5 3.2* 3.2* 3.1* 3.2*  CL 94* 93* 95* 94* 102  CO2 27 29 27 29 26   GLUCOSE 278* 191* 206* 212* 255*  BUN 84* 82* 84* 88* 51*  CREATININE 4.22* 3.99* 4.00* 4.18* 2.82*  CALCIUM 8.9 9.1 8.8* 8.7* 7.6*  PHOS 4.2 3.8 4.2 4.3 3.1   GFR: Estimated Creatinine Clearance: 21 mL/min (A) (by C-G formula based on SCr of 2.82 mg/dL (H)). Liver Function Tests: Recent Labs  Lab 01/04/17 0454 01/05/17 0615 01/06/17 0416 01/07/17 0649 01/08/17 0519  ALBUMIN 2.5* 2.6* 2.6* 2.6* 2.2*   No  results for input(s): LIPASE, AMYLASE in the last 168 hours. No results for input(s): AMMONIA in the last 168 hours. Coagulation Profile: Recent Labs  Lab 01/04/17 0454 01/05/17 0615 01/06/17 0416 01/07/17 0649 01/08/17 0519  INR 2.82 2.82 2.64 3.04 3.17   Cardiac Enzymes: No results for input(s): CKTOTAL, CKMB, CKMBINDEX, TROPONINI in the last 168 hours. BNP (last 3 results) No results for input(s): PROBNP in the last 8760 hours. HbA1C: No results for input(s): HGBA1C in the last 72 hours. CBG: Recent Labs  Lab 01/07/17 1127 01/07/17 1613 01/07/17 2128 01/08/17 0741 01/08/17 1226  GLUCAP 151* 133* 107* 219* 98   Lipid Profile: Recent Labs    01/06/17 0416  CHOL 100  HDL 47  LDLCALC 41  TRIG 62  CHOLHDL 2.1   Thyroid Function Tests: No results for input(s): TSH, T4TOTAL, FREET4, T3FREE, THYROIDAB in the last 72 hours. Anemia Panel: No results for input(s): VITAMINB12, FOLATE, FERRITIN, TIBC, IRON, RETICCTPCT in the last 72 hours. Sepsis Labs: No results for input(s): PROCALCITON, LATICACIDVEN in the last 168 hours.  No results found for this or any previous visit (from the past 240 hour(s)).  Radiology Studies: Ir Fluoro Guide Cv Line Right  Result Date: 01/07/2017 INDICATION: End-stage renal disease EXAM: TEMPORARY RIGHT JUGULAR DIALYSIS CATHETER PLACEMENT MEDICATIONS: None ANESTHESIA/SEDATION: None FLUOROSCOPY TIME:  Fluoroscopy Time: 1 minutes  seconds ( mGy). COMPLICATIONS: None immediate. PROCEDURE: Informed written consent was obtained from the patient after a thorough discussion of the procedural risks, benefits and alternatives. All questions were addressed. Maximal Sterile Barrier Technique was utilized including caps, mask, sterile gowns, sterile gloves, sterile drape, hand hygiene and skin antiseptic. A timeout was performed prior to the initiation of the procedure. The right neck was prepped and draped in a sterile fashion. 1% lidocaine was  utilized for local anesthesia. Under sonographic guidance, a micropuncture needle was inserted into the right jugular vein which was removed over a 018 wire which was up sized to an Amplatz. The dialysis catheter dilator followed by the temporary dialysis catheter were advanced over the Amplatz wire. It was flushed and sewn in place. Heparin was instilled. FINDINGS: The tip of the right jugular temporary dialysis catheter is at the cavoatrial junction. IMPRESSION: Successful right jugular temporary dialysis catheter placement with its tip at the cavoatrial junction. Electronically Signed   By: Marybelle Killings M.D.   On: 01/07/2017 11:55   Ir US Guide Vasc Access Right  Result Date: 01/07/2017 INDICATION: End-stage renal disease EXAM: TEMPORARY RIGHT JUGULAR DIALYSIS CATHETER PLACEMENT MEDICATIONS: None ANESTHESIA/SEDATION: None FLUOROSCOPY TIME:  Fluoroscopy Time: 1 minutes  seconds ( mGy). COMPLICATIONS: None immediate. PROCEDURE: Informed written consent was obtained from the patient after a thorough discussion of the procedural risks, benefits and alternatives. All questions were addressed. Maximal Sterile Barrier Technique was utilized including caps, mask, sterile gowns, sterile gloves, sterile drape, hand hygiene and skin antiseptic. A timeout was performed prior to the initiation of the procedure. The right neck was prepped and draped in a sterile fashion. 1% lidocaine was utilized for local anesthesia. Under sonographic guidance, a micropuncture needle was inserted into the right jugular vein which was removed over a 018 wire which was up sized to an Amplatz. The dialysis catheter dilator followed by the temporary dialysis catheter were advanced over the Amplatz wire. It was flushed and sewn in place. Heparin was instilled. FINDINGS: The tip of the right jugular temporary dialysis catheter is at the cavoatrial junction. IMPRESSION: Successful right jugular temporary dialysis catheter placement with its tip  at the cavoatrial junction. Electronically Signed   By: Marybelle Killings M.D.   On: 01/07/2017 11:55        Scheduled Meds: . ALPRAZolam  0.5 mg Oral QHS  . amiodarone  200 mg Oral Daily  . calcitRIOL  0.5 mcg Oral Daily  . clopidogrel  75 mg Oral Daily  . cyclobenzaprine  5 mg Oral QHS  . darbepoetin (ARANESP) injection - NON-DIALYSIS  150 mcg Subcutaneous Q Mon-1800  . fluticasone  1 spray Each Nare Daily  . insulin aspart  0-15 Units Subcutaneous TID WC  . insulin aspart  0-5 Units Subcutaneous QHS  . insulin glargine  23 Units Subcutaneous QHS  . isosorbide mononitrate  30 mg Oral BID  . latanoprost  1 drop Both Eyes QHS  . loratadine  10 mg Oral Daily  . meclizine  12.5 mg Oral BID  . mouth rinse  15 mL Mouth Rinse BID  . metoprolol succinate  25 mg Oral QHS  . pantoprazole  40 mg Oral Daily  . rosuvastatin  40 mg Oral q1800  . senna-docusate  2 tablet Oral BID  .  sodium chloride flush  3 mL Intravenous Q12H  . Warfarin - Pharmacist Dosing Inpatient   Does not apply q1800   Continuous Infusions: . sodium chloride    . sodium chloride 10 mL/hr at 12/30/16 1254  . sodium chloride    . sodium chloride       LOS: 16 days    Time spent: 35 minutes.     Vernell Leep, MD, FACP, FHM. [facility] Pager [phone]  If 7PM-7AM, please contact night-coverage [email or web address] Password [email or web address] 01/08/2017, 1:30 PM

## 2017-01-08 NOTE — Progress Notes (Signed)
Patient back on [facility] following second dialysis session, BP soft but stable.  Patient denies any chest pain or shortness.

## 2017-01-09 LAB — RENAL FUNCTION PANEL
Albumin: 2.6 g/dL — ABNORMAL LOW (ref 3.5–5.0)
Anion gap: 9 (ref 5–15)
BUN: 35 mg/dL — AB (ref 6–20)
CHLORIDE: 99 mmol/L — AB (ref 101–111)
CO2: 28 mmol/L (ref 22–32)
Calcium: 8.3 mg/dL — ABNORMAL LOW (ref 8.9–10.3)
Creatinine, Ser: 3.1 mg/dL — ABNORMAL HIGH (ref 0.44–1.00)
GFR calc Af Amer: 16 mL/min — ABNORMAL LOW (ref >60)
GFR, EST NON AFRICAN AMERICAN: 14 mL/min — AB (ref >60)
Glucose, Bld: 139 mg/dL — ABNORMAL HIGH (ref 65–99)
POTASSIUM: 3.5 mmol/L (ref 3.5–5.1)
Phosphorus: 3.2 mg/dL (ref 2.5–4.6)
Sodium: 136 mmol/L (ref 135–145)

## 2017-01-09 LAB — CBC
HEMATOCRIT: 29.6 % — AB (ref 36.0–46.0)
Hemoglobin: 8.7 g/dL — ABNORMAL LOW (ref 12.0–15.0)
MCH: 28.9 pg (ref 26.0–34.0)
MCHC: 29.4 g/dL — ABNORMAL LOW (ref 30.0–36.0)
MCV: 98.3 fL (ref 78.0–100.0)
Platelets: 175 10*3/uL (ref 150–400)
RBC: 3.01 MIL/uL — ABNORMAL LOW (ref 3.87–5.11)
RDW: 20.4 % — AB (ref 11.5–15.5)
WBC: 3.5 10*3/uL — AB (ref 4.0–10.5)

## 2017-01-09 LAB — GLUCOSE, CAPILLARY
Glucose-Capillary: 73 mg/dL (ref 65–99)
Glucose-Capillary: 191 mg/dL — ABNORMAL HIGH (ref 65–99)
Glucose-Capillary: 209 mg/dL — ABNORMAL HIGH (ref 65–99)

## 2017-01-09 LAB — PROTIME-INR
INR: 2.12
Prothrombin Time: 23.6 seconds — ABNORMAL HIGH (ref 11.4–15.2)

## 2017-01-09 MED ORDER — WARFARIN 0.5 MG HALF TABLET
0.5000 mg | ORAL_TABLET | Freq: Once | ORAL | Status: DC
  Filled 2017-01-09: qty 1

## 2017-01-09 MED ORDER — DARBEPOETIN ALFA 150 MCG/0.3ML IJ SOSY
PREFILLED_SYRINGE | INTRAMUSCULAR | Status: AC
  Administered 2017-01-09: 150 ug via SUBCUTANEOUS
  Filled 2017-01-09: qty 0.3

## 2017-01-09 MED ORDER — GABAPENTIN 400 MG PO CAPS
400.0000 mg | ORAL_CAPSULE | Freq: Once | ORAL | Status: AC
  Administered 2017-01-09: 400 mg via ORAL
  Filled 2017-01-09: qty 1

## 2017-01-09 NOTE — Progress Notes (Signed)
OT Cancellation Note  Patient Details Name: MAYSUN MEDITZ MRN: [id number] DOB: May 26, 1945   Cancelled Treatment:    Reason Eval/Treat Not Completed: Patient declined, no reason specified. Pt reports that she will not participate with OT at this time because she is about to eat and just returned from dialysis. Will check back as able.   Norman Herrlich, MS OTR/L  Pager: [place] A Kataleyah Carducci 01/09/2017, 1:18 PM

## 2017-01-09 NOTE — Progress Notes (Signed)
PROGRESS NOTE    [name]  BMW:[id number] DOB: June 06, 1945 DOA: 12/23/2016 PCP: [name], MD   Brief Narrative: [name] is a 71 y.o. female with medical history significant for insulin-dependent diabetes mellitus, chronic kidney disease stage IV, chronic combined systolic/diastolic CHF, chronic atrial fibrillation, and coronary artery disease, presented to the ED at the direction of her nephrologist for evaluation of swelling and fatigue. She has been followed by cardiology and nephrology for worsening fluid status complicating worsening kidney function. She continued to be volume overloaded despite increase in her diuretics recently. Admitted with acute on chronic diastolic heart failure secondary to Acute on chronic renal failure.  Nephrology and cardiology following.  Unable to manage volume status despite aggressive IV diuresis.  Now deemed ESRD.  Temporary right IJ HD catheter placed and HD started on 01/07/17.     Assessment & Plan:   Principal Problem:   CKD (chronic kidney disease), stage IV (HCC) Active Problems:   Essential hypertension   Coronary artery disease   Acute on chronic combined systolic and diastolic CHF (congestive heart failure) (HCC)   Elevated troponin   DM type 2 causing vascular disease (HCC)   Pleural effusion on right   Anemia in chronic kidney disease   Atrial fibrillation (HCC)   Hypokalemia   Hypervolemia   Rhonchi   Acute on chronic combined systolic/diastolic CHF  Echo 04/14/23, EF 36-64%, grade 2 diastolic dysfunction, mild MR, severe LAE Nephrology and cardiology following. Despite extensive efforts to try to manage patient with medication/aggressive IV diuresis, patient did not make satisfactory progress, unable to offload fluid and creatinine was worsening. After multiple and extensive discussions with patient, finally started on HD on 11/3. Volume management across dialysis. Improving.  Acute kidney injury superimposed  on CKD stage IV, now new ESRD Underwent AV graft placement 10-26. Possible mild steal post op.  Despite extensive efforts to try to manage patient with medication/aggressive IV diuresis, patient did not make satisfactory progress, unable to offload fluid and creatinine was worsening. After multiple and extensive discussions with patient, finally started on HD on 11/3. Volume management across dialysis. Nephrology initiating CLIP, patient will need tunneled HD catheter prior to discharge.  High-dose IV Lasix discontinued.  Day 3/3 of continuous HD. Discussed with Dr. Justin Mend.  Paroxysmal A fib;   - Cardiology following. Continue amiodarone, metoprolol, Plavix and anticoagulated on Coumadin. Remains in SB/SR.  CAD,  Asymptomatic of chest pain. Continue Plavix, Imdur, metoprolol and statins.  Diabetes; type 2;  CBG is uncontrolled and mildly fluctuating. Currently on Lantus 20 units at bedtime and NovoLog SSI, moderate sensitivity. Continue current regimen without change.  Lantus to 23 units at bedtime.  CBG better.  Hypokalemia Management per nephrology.  Anemia Stable. Status post 1 dose of Feraheme.  Leukopenia Unclear etiology.  Stable.  Follow CBCs.  Essential hypertension Controlled   DVT prophylaxis: Anticoagulated on Coumadin Code Status: Full code Family Communication: None at bedside. Disposition Plan: Not medically stable for discharge.  DC likely home when stable and cleared by nephrology..   Consultants:   Nephrology   Cardiology  IR   Procedures:  Right IJ NON-TUNNELED HD catheter placed by IR on 11/3   Antimicrobials:  None   Subjective: Seen this morning during HD. Leg swelling slowly improving. No other complaints reported.   Objective: Vitals:   01/09/17 0930 01/09/17 1000 01/09/17 1030 01/09/17 1147  BP: 137/70 (!) 104/51 (!) 105/47 (!) 112/55  Pulse: 63 (!) 59 (!) 58 71  Resp: (!) 27 18 18 17   Temp:    (!) 97.3 F (36.3 C)  TempSrc:     Oral  SpO2:    98%  Weight:    86.3 kg (190 lb 4.1 oz)  Height:        Intake/Output Summary (Last 24 hours) at 01/08/2017 1330 Last data filed at 01/08/2017 0647 Gross per 24 hour  Intake 1039.83 ml  Output 1954 ml  Net -914.17 ml   Filed Weights   01/07/17 2052 01/08/17 0519 01/08/17 0836  Weight: 89.8 kg (197 lb 15.6 oz) 88.8 kg (195 lb 11.2 oz) 89 kg (196 lb 3.4 oz)    Examination:  General exam; pleasant elderly female, lying comfortably propped up in bed undergoing HD. Respiratory system: Clear to auscultation.  No increased work of breathing. Stable. Cardiovascular system: S1 and S2 heard, heart RRR.  No JVD or murmurs.  1+ pitting bilateral leg edema.   Gastrointestinal system:  Nondistended, soft and nontender. Normal bowel sounds heard.  Stable. Central nervous system: Alert and oriented 3. No focal neurological deficits. Extremities: symmetric power. Skin: no rash    Data Reviewed: I have personally reviewed following labs and imaging studies  CBC Latest Ref Rng & Units 01/09/2017 01/08/2017 01/07/2017  WBC 4.0 - 10.5 K/uL 3.5(L) 3.2(L) 3.8(L)  Hemoglobin 12.0 - 15.0 g/dL 8.7(L) 8.8(L) 8.7(L)  Hematocrit 36.0 - 46.0 % 29.6(L) 29.1(L) 29.6(L)  Platelets 150 - 400 K/uL 175 186 195   BMP Latest Ref Rng & Units 01/09/2017 01/08/2017 01/07/2017  Glucose 65 - 99 mg/dL 139(H) 255(H) 212(H)  BUN 6 - 20 mg/dL 35(H) 51(H) 88(H)  Creatinine 0.44 - 1.00 mg/dL 3.10(H) 2.82(H) 4.18(H)  BUN/Creat Ratio 12 - 28 - - -  Sodium 135 - 145 mmol/L 136 137 133(L)  Potassium 3.5 - 5.1 mmol/L 3.5 3.2(L) 3.1(L)  Chloride 101 - 111 mmol/L 99(L) 102 94(L)  CO2 22 - 32 mmol/L 28 26 29   Calcium 8.9 - 10.3 mg/dL 8.3(L) 7.6(L) 8.7(L)    CBG (last 3)  Recent Labs    01/08/17 2133 01/09/17 1304 01/09/17 1640  GLUCAP 217* 73 191*    Lipid Profile: Recent Labs    01/06/17 0416  CHOL 100  HDL 47  LDLCALC 41  TRIG 62  CHOLHDL 2.1     Radiology Studies: Ir Fluoro Guide Cv Line  Right  Result Date: 01/07/2017 INDICATION: End-stage renal disease EXAM: TEMPORARY RIGHT JUGULAR DIALYSIS CATHETER PLACEMENT MEDICATIONS: None ANESTHESIA/SEDATION: None FLUOROSCOPY TIME:  Fluoroscopy Time: 1 minutes  seconds ( mGy). COMPLICATIONS: None immediate. PROCEDURE: Informed written consent was obtained from the patient after a thorough discussion of the procedural risks, benefits and alternatives. All questions were addressed. Maximal Sterile Barrier Technique was utilized including caps, mask, sterile gowns, sterile gloves, sterile drape, hand hygiene and skin antiseptic. A timeout was performed prior to the initiation of the procedure. The right neck was prepped and draped in a sterile fashion. 1% lidocaine was utilized for local anesthesia. Under sonographic guidance, a micropuncture needle was inserted into the right jugular vein which was removed over a 018 wire which was up sized to an Amplatz. The dialysis catheter dilator followed by the temporary dialysis catheter were advanced over the Amplatz wire. It was flushed and sewn in place. Heparin was instilled. FINDINGS: The tip of the right jugular temporary dialysis catheter is at the cavoatrial junction. IMPRESSION: Successful right jugular temporary dialysis catheter placement with its tip at the cavoatrial junction. Electronically Signed  By: Marybelle Killings M.D.   On: 01/07/2017 11:55   Ir US Guide Vasc Access Right  Result Date: 01/07/2017 INDICATION: End-stage renal disease EXAM: TEMPORARY RIGHT JUGULAR DIALYSIS CATHETER PLACEMENT MEDICATIONS: None ANESTHESIA/SEDATION: None FLUOROSCOPY TIME:  Fluoroscopy Time: 1 minutes  seconds ( mGy). COMPLICATIONS: None immediate. PROCEDURE: Informed written consent was obtained from the patient after a thorough discussion of the procedural risks, benefits and alternatives. All questions were addressed. Maximal Sterile Barrier Technique was utilized including caps, mask, sterile gowns, sterile gloves,  sterile drape, hand hygiene and skin antiseptic. A timeout was performed prior to the initiation of the procedure. The right neck was prepped and draped in a sterile fashion. 1% lidocaine was utilized for local anesthesia. Under sonographic guidance, a micropuncture needle was inserted into the right jugular vein which was removed over a 018 wire which was up sized to an Amplatz. The dialysis catheter dilator followed by the temporary dialysis catheter were advanced over the Amplatz wire. It was flushed and sewn in place. Heparin was instilled. FINDINGS: The tip of the right jugular temporary dialysis catheter is at the cavoatrial junction. IMPRESSION: Successful right jugular temporary dialysis catheter placement with its tip at the cavoatrial junction. Electronically Signed   By: Marybelle Killings M.D.   On: 01/07/2017 11:55        Scheduled Meds: . ALPRAZolam  0.5 mg Oral QHS  . amiodarone  200 mg Oral Daily  . calcitRIOL  0.5 mcg Oral Daily  . clopidogrel  75 mg Oral Daily  . cyclobenzaprine  5 mg Oral QHS  . darbepoetin (ARANESP) injection - NON-DIALYSIS  150 mcg Subcutaneous Q Mon-1800  . fluticasone  1 spray Each Nare Daily  . insulin aspart  0-15 Units Subcutaneous TID WC  . insulin aspart  0-5 Units Subcutaneous QHS  . insulin glargine  23 Units Subcutaneous QHS  . isosorbide mononitrate  30 mg Oral BID  . latanoprost  1 drop Both Eyes QHS  . loratadine  10 mg Oral Daily  . meclizine  12.5 mg Oral BID  . mouth rinse  15 mL Mouth Rinse BID  . metoprolol succinate  25 mg Oral QHS  . pantoprazole  40 mg Oral Daily  . rosuvastatin  40 mg Oral q1800  . senna-docusate  2 tablet Oral BID  . sodium chloride flush  3 mL Intravenous Q12H  . Warfarin - Pharmacist Dosing Inpatient   Does not apply q1800   Continuous Infusions: . sodium chloride    . sodium chloride 10 mL/hr at 12/30/16 1254  . sodium chloride    . sodium chloride       LOS: 16 days    Time spent: 35 minutes.      Vernell Leep, MD, FACP, FHM. [facility] Pager [phone]  If 7PM-7AM, please contact night-coverage [email or web address] Password [email or web address] 01/08/2017, 1:30 PM

## 2017-01-09 NOTE — Progress Notes (Signed)
Saw patient today to discuss conversion of her temporary catheter to a tunneled HD catheter.  She is agreeable to this; however, she is currently on plavix and coumadin with an INR of 2.1.  Both of these will need to be held.  Her plavix is a 5 day hold and coumadin will likely normalize by 5 days.  If she does not get any of these today, then the earliest we could convert the catheter would be Friday.  I did discuss this with Dr. Hollie Salk in HD.  Abree Romick E 10:58 AM 01/09/2017

## 2017-01-09 NOTE — Progress Notes (Signed)
Occupational Therapy Treatment Patient Details Name: [name] MRN: [id number] DOB: October 06, 1945 Today's Date: 01/09/2017    History of present illness 71 y.o. female PMH of  CAD (s/p NSTEMI in 06/2015 with DES to PDA and RCA at that time, subtotal occlusion of LCX treated medically; repeat cath 11/2015 with stable disease; inferolateral scar w/o ischemia), chronic combined systolic and diastolic CHF (EF 00-76% by echo in 11/2016), paroxysmal atrial fibrillation, right subclavian stenosis (s/p intervention), IDDM, HTN, HLD, and CKD Stage IV admitted on 12/23/2016 for worsening edema and fatigue. Pt being treated for acute on chronic combined systolic and diastolic CHF, acute on chronic Stage 4 CKD, paroxysmal A-fib, CAD, anemia, hypoprothrobinemia, and IDDM.    OT comments  Pt limited by fatigue and lethargy this session after dialysis treatment. On OT arrival, pt reporting feeling "woozy" sitting in chair. Pt with significantly increased weakness this date requiring mod assist for simulated stand-pivot toilet transfer this date with knee buckling noted. Pt falling asleep quickly once returned to bed with min assist for bed mobility this session. Notified RN. D/C recommendation remains appropriate. Will continue to follow.    Follow Up Recommendations  SNF;Supervision/Assistance - 24 hour    Equipment Recommendations  Other (comment)(TBD at next venue of care)    Recommendations for Other Services      Precautions / Restrictions Precautions Precautions: Fall Restrictions Weight Bearing Restrictions: No       Mobility Bed Mobility Overal bed mobility: Needs Assistance Bed Mobility: Rolling;Sit to Sidelying Rolling: Min guard       Sit to sidelying: Min assist General bed mobility comments: Assist to raise R LE back into bed.   Transfers Overall transfer level: Needs assistance Equipment used: Rolling walker (2 wheeled) Transfers: Sit to/from [employer] Sit to Stand: Mod assist Stand pivot transfers: Mod assist       General transfer comment: Mod assist due to increased weakness after dialysis this date.     Balance Overall balance assessment: Needs assistance Sitting-balance support: No upper extremity supported;Feet supported Sitting balance-[name]: Good Sitting balance - Comments: able to sit up during LE seated exercises   Standing balance support: Bilateral upper extremity supported Standing balance-[name]: Poor Standing balance comment: Relies on UE support.                            ADL either performed or assessed with clinical judgement   ADL Overall ADL's : Needs assistance/impaired                         Toilet Transfer: Moderate assistance;Stand-pivot;BSC Toilet Transfer Details (indicate cue type and reason): Simulated from chair to bed.            General ADL Comments: Pt very lethargic this session. She reports feeling "woozy" since returning from first dialysis session. Attempted to stand with RW but significant knee buckling occured. Pt assisted from chair to bed with mod assist and one person handheld assistance.      Vision   Vision Assessment?: No apparent visual deficits   Perception     Praxis      Cognition Arousal/Alertness: Lethargic Behavior During Therapy: WFL for tasks assessed/performed Overall Cognitive Status: Within Functional Limits for tasks assessed  General Comments: Pt lethargic and falling asleep at times during session.         Exercises     Shoulder Instructions       General Comments      Pertinent Vitals/ Pain       Pain Assessment: Faces Faces Pain Scale: Hurts little more Pain Location: generalized Pain Descriptors / Indicators: Sore Pain Intervention(s): Limited activity within patient's tolerance;Monitored during session  Home Living                                           Prior Functioning/Environment              Frequency  Min 2X/week        Progress Toward Goals  OT Goals(current goals can now be found in the care plan section)  Progress towards OT goals: Not progressing toward goals - comment(increased weakness from HD)  Acute Rehab OT Goals Patient Stated Goal: get back to bed OT Goal Formulation: With patient Time For Goal Achievement: 01/23/17 Potential to Achieve Goals: Good  Plan Discharge plan remains appropriate;Frequency remains appropriate    Co-evaluation                 AM-PAC PT "6 Clicks" Daily Activity     Outcome Measure   Help from another person eating meals?: A Little Help from another person taking care of personal grooming?: A Little Help from another person toileting, which includes using toliet, bedpan, or urinal?: A Lot Help from another person bathing (including washing, rinsing, drying)?: A Lot Help from another person to put on and taking off regular upper body clothing?: A Little Help from another person to put on and taking off regular lower body clothing?: A Lot 6 Click Score: 15    End of Session Equipment Utilized During Treatment: Gait belt;Rolling walker  OT Visit Diagnosis: Unsteadiness on feet (R26.81);Muscle weakness (generalized) (M62.81)   Activity Tolerance Patient limited by fatigue   Patient Left in chair;with call bell/phone within reach   Nurse Communication Mobility status(pt feeling "woozy" and increased weakness noted)        Time: 1510-1526 OT Time Calculation (min): 16 min  Charges: OT General Charges $OT Visit: 1 Visit OT Treatments $Self Care/Home Management : 8-22 mins  Norman Herrlich, MS OTR/L  Pager: [place] A Caiden Monsivais 01/09/2017, 5:35 PM

## 2017-01-09 NOTE — Progress Notes (Signed)
Dialysis called for report. 

## 2017-01-09 NOTE — Progress Notes (Signed)
[facility] for warfarin Indication: atrial fibrillation  Allergies  Allergen Reactions  . Ace Inhibitors Cough  . Amlodipine Swelling    UNSPECIFIED EDEMA   . Codeine Rash    Patient Measurements: Height: 5\' 8"  (172.7 cm) Weight: 195 lb 8.8 oz (88.7 kg) IBW/kg (Calculated) : 63.9  Vital Signs: Temp: 98.6 F (37 C) (11/05 0729) Temp Source: Oral (11/05 0729) BP: 105/47 (11/05 1030) Pulse Rate: 58 (11/05 1030)  Labs: Recent Labs    01/07/17 0649 01/08/17 0519 01/09/17 0400  HGB 8.7* 8.8* 8.7*  HCT 29.6* 29.1* 29.6*  PLT 195 186 175  LABPROT 31.2* 32.2* 23.6*  INR 3.04 3.17 2.12  CREATININE 4.18* 2.82* 3.10*    Assessment: Warfarin pta for h/o afib; Warfarin was held last night. INR 3 > 2.1 today. --INR trend, sensitive to warfarin --Possible DDI: amiodarone but on PTA w/warf & amio dose is lower now    Goal of Therapy:  INR 2-3 Monitor platelets by anticoagulation protocol: Yes    Plan:  Warfarin 0.5 mg po x1 Monitor INR daily     Harvel Quale 01/09/2017,11:15 AM

## 2017-01-09 NOTE — Progress Notes (Signed)
Pt is gone off the floor and will try again later as time and pt allow.   01/09/17 1200  PT Visit Information  Last PT Received On 01/09/17  Reason Eval/Treat Not Completed Patient at procedure or test/unavailable    Mee Hives, PT MS Acute Rehab Dept. Number: [place] [facility] [place]

## 2017-01-09 NOTE — Progress Notes (Signed)
[facility] ROUNDING NOTE   Subjective:   Interval History:  No complaints appears to be doing well this morning  Objective:  Vital signs in last 24 hours:  Temp:  [97.5 F (36.4 C)-98.7 F (37.1 C)] 98.6 F (37 C) (11/05 0617) Pulse Rate:  [52-62] 57 (11/05 0617) Resp:  [18] 18 (11/05 0617) BP: (90-113)/(51-60) 98/60 (11/05 0617) SpO2:  [98 %-100 %] 98 % (11/05 0617) Weight:  [195 lb 6.4 oz (88.6 kg)-196 lb 3.4 oz (89 kg)] 195 lb 6.4 oz (88.6 kg) (11/05 0617)  Weight change: -15.5 oz (-1.8 kg) Filed Weights   01/08/17 0519 01/08/17 0836 01/09/17 0617  Weight: 195 lb 11.2 oz (88.8 kg) 196 lb 3.4 oz (89 kg) 195 lb 6.4 oz (88.6 kg)    Intake/Output: I/O last 3 completed shifts: In: 1532 [P.O.:840; I.V.:560; IV Piggyback:132] Out: 1953 [Urine:150; Other:1802; Stool:1]   Intake/Output this shift:  No intake/output data recorded.  Gen: NAD HEENT: + JVD. R IJ nontunneled HD catheter in place CVS: Regular rate and rhythm  Resp: bilaterally decreased breath sounds at bases,with few basal crackles. Abd: soft, non tender, bowel sounds positive. Ext:3+lower extremity edema, slightly improved,left AVG with good bruit.     Basic Metabolic Panel: Recent Labs  Lab 01/05/17 0615 01/06/17 0416 01/07/17 0649 01/08/17 0519 01/09/17 0400  NA 134* 134* 133* 137 136  K 3.2* 3.2* 3.1* 3.2* 3.5  CL 93* 95* 94* 102 99*  CO2 29 27 29 26 28   GLUCOSE 191* 206* 212* 255* 139*  BUN 82* 84* 88* 51* 35*  CREATININE 3.99* 4.00* 4.18* 2.82* 3.10*  CALCIUM 9.1 8.8* 8.7* 7.6* 8.3*  PHOS 3.8 4.2 4.3 3.1 3.2    Liver Function Tests: Recent Labs  Lab 01/05/17 0615 01/06/17 0416 01/07/17 0649 01/08/17 0519 01/09/17 0400  ALBUMIN 2.6* 2.6* 2.6* 2.2* 2.6*   No results for input(s): LIPASE, AMYLASE in the last 168 hours. No results for input(s): AMMONIA in the last 168 hours.  CBC: Recent Labs  Lab 01/05/17 0615 01/06/17 0416 01/07/17 0649 01/08/17 0519  01/09/17 0400  WBC 3.7* 3.2* 3.8* 3.2* 3.5*  HGB 8.9* 8.6* 8.7* 8.8* 8.7*  HCT 29.0* 28.7* 29.6* 29.1* 29.6*  MCV 95.7 95.7 97.4 98.6 98.3  PLT 193 178 195 186 175    Cardiac Enzymes: No results for input(s): CKTOTAL, CKMB, CKMBINDEX, TROPONINI in the last 168 hours.  BNP: Invalid input(s): POCBNP  CBG: Recent Labs  Lab 01/08/17 0741 01/08/17 1226 01/08/17 1605 01/08/17 2131 01/08/17 2133  GLUCAP 219* 98 213* 302* 38*    Microbiology: Results for orders placed or performed during the hospital encounter of 12/23/16  MRSA PCR Screening     Status: None   Collection Time: 12/24/16  1:39 AM  Result Value Ref Range Status   MRSA by PCR NEGATIVE NEGATIVE Final    Comment:        The GeneXpert MRSA Assay (FDA approved for NASAL specimens only), is one component of a comprehensive MRSA colonization surveillance program. It is not intended to diagnose MRSA infection nor to guide or monitor treatment for MRSA infections.   Surgical pcr screen     Status: None   Collection Time: 12/28/16  7:26 PM  Result Value Ref Range Status   MRSA, PCR NEGATIVE NEGATIVE Final   Staphylococcus aureus NEGATIVE NEGATIVE Final    Comment: (NOTE) The Xpert SA Assay (FDA approved for NASAL specimens in patients 61 years of age and older), is one component of  a comprehensive surveillance program. It is not intended to diagnose infection nor to guide or monitor treatment.     Coagulation Studies: Recent Labs    01/07/17 0649 01/08/17 0519 01/09/17 0400  LABPROT 31.2* 32.2* 23.6*  INR 3.04 3.17 2.12    Urinalysis: No results for input(s): COLORURINE, LABSPEC, PHURINE, GLUCOSEU, HGBUR, BILIRUBINUR, KETONESUR, PROTEINUR, UROBILINOGEN, NITRITE, LEUKOCYTESUR in the last 72 hours.  Invalid input(s): APPERANCEUR    Imaging: Ir Fluoro Guide Cv Line Right  Result Date: 01/07/2017 INDICATION: End-stage renal disease EXAM: TEMPORARY RIGHT JUGULAR DIALYSIS CATHETER PLACEMENT  MEDICATIONS: None ANESTHESIA/SEDATION: None FLUOROSCOPY TIME:  Fluoroscopy Time: 1 minutes  seconds ( mGy). COMPLICATIONS: None immediate. PROCEDURE: Informed written consent was obtained from the patient after a thorough discussion of the procedural risks, benefits and alternatives. All questions were addressed. Maximal Sterile Barrier Technique was utilized including caps, mask, sterile gowns, sterile gloves, sterile drape, hand hygiene and skin antiseptic. A timeout was performed prior to the initiation of the procedure. The right neck was prepped and draped in a sterile fashion. 1% lidocaine was utilized for local anesthesia. Under sonographic guidance, a micropuncture needle was inserted into the right jugular vein which was removed over a 018 wire which was up sized to an Amplatz. The dialysis catheter dilator followed by the temporary dialysis catheter were advanced over the Amplatz wire. It was flushed and sewn in place. Heparin was instilled. FINDINGS: The tip of the right jugular temporary dialysis catheter is at the cavoatrial junction. IMPRESSION: Successful right jugular temporary dialysis catheter placement with its tip at the cavoatrial junction. Electronically Signed   By: Marybelle Killings M.D.   On: 01/07/2017 11:55   Ir US Guide Vasc Access Right  Result Date: 01/07/2017 INDICATION: End-stage renal disease EXAM: TEMPORARY RIGHT JUGULAR DIALYSIS CATHETER PLACEMENT MEDICATIONS: None ANESTHESIA/SEDATION: None FLUOROSCOPY TIME:  Fluoroscopy Time: 1 minutes  seconds ( mGy). COMPLICATIONS: None immediate. PROCEDURE: Informed written consent was obtained from the patient after a thorough discussion of the procedural risks, benefits and alternatives. All questions were addressed. Maximal Sterile Barrier Technique was utilized including caps, mask, sterile gowns, sterile gloves, sterile drape, hand hygiene and skin antiseptic. A timeout was performed prior to the initiation of the procedure. The right neck  was prepped and draped in a sterile fashion. 1% lidocaine was utilized for local anesthesia. Under sonographic guidance, a micropuncture needle was inserted into the right jugular vein which was removed over a 018 wire which was up sized to an Amplatz. The dialysis catheter dilator followed by the temporary dialysis catheter were advanced over the Amplatz wire. It was flushed and sewn in place. Heparin was instilled. FINDINGS: The tip of the right jugular temporary dialysis catheter is at the cavoatrial junction. IMPRESSION: Successful right jugular temporary dialysis catheter placement with its tip at the cavoatrial junction. Electronically Signed   By: Marybelle Killings M.D.   On: 01/07/2017 11:55     Medications:   . sodium chloride    . sodium chloride 10 mL/hr at 01/09/17 0418  . sodium chloride    . sodium chloride     . ALPRAZolam  0.5 mg Oral QHS  . amiodarone  200 mg Oral Daily  . calcitRIOL  0.5 mcg Oral Daily  . clopidogrel  75 mg Oral Daily  . cyclobenzaprine  5 mg Oral QHS  . darbepoetin (ARANESP) injection - NON-DIALYSIS  150 mcg Subcutaneous Q Mon-1800  . fluticasone  1 spray Each Nare Daily  . insulin aspart  0-15  Units Subcutaneous TID WC  . insulin aspart  0-5 Units Subcutaneous QHS  . insulin glargine  23 Units Subcutaneous QHS  . isosorbide mononitrate  30 mg Oral BID  . latanoprost  1 drop Both Eyes QHS  . loratadine  10 mg Oral Daily  . meclizine  12.5 mg Oral BID  . mouth rinse  15 mL Mouth Rinse BID  . metoprolol succinate  25 mg Oral QHS  . pantoprazole  40 mg Oral Daily  . rosuvastatin  40 mg Oral q1800  . senna-docusate  2 tablet Oral BID  . sodium chloride flush  3 mL Intravenous Q12H  . 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  Will initiate CLIP. ( called will need case management as patient lives in [place])  Needs her HD catheter tunneled before d/c.  Will arrange tunneling of catheter  HFrEF. Cardiology is following  Anemia. Hemoglobin stable, had 1 dose of Feraheme.  Aranesp 150 per week.  Hypertension. BPs a little soft.  Afib. Rate controlled with amiodarone and beta blocker, on warfarin  DM. per primary      LOS: 17 [name] W @TODAY @8 :17 AM

## 2017-01-10 ENCOUNTER — Ambulatory Visit: Payer: BLUE CROSS/BLUE SHIELD | Admitting: "Endocrinology

## 2017-01-10 ENCOUNTER — Encounter: Payer: Self-pay | Admitting: "Endocrinology

## 2017-01-10 LAB — GLUCOSE, CAPILLARY
GLUCOSE-CAPILLARY: 65 mg/dL (ref 65–99)
GLUCOSE-CAPILLARY: 95 mg/dL (ref 65–99)
Glucose-Capillary: 122 mg/dL — ABNORMAL HIGH (ref 65–99)
Glucose-Capillary: 181 mg/dL — ABNORMAL HIGH (ref 65–99)

## 2017-01-10 LAB — RENAL FUNCTION PANEL
ALBUMIN: 2.5 g/dL — AB (ref 3.5–5.0)
Anion gap: 8 (ref 5–15)
BUN: 23 mg/dL — AB (ref 6–20)
CO2: 29 mmol/L (ref 22–32)
CREATININE: 3.16 mg/dL — AB (ref 0.44–1.00)
Calcium: 8.4 mg/dL — ABNORMAL LOW (ref 8.9–10.3)
Chloride: 99 mmol/L — ABNORMAL LOW (ref 101–111)
GFR calc Af Amer: 16 mL/min — ABNORMAL LOW (ref >60)
GFR, EST NON AFRICAN AMERICAN: 14 mL/min — AB (ref >60)
GLUCOSE: 90 mg/dL (ref 65–99)
PHOSPHORUS: 3.2 mg/dL (ref 2.5–4.6)
Potassium: 3.8 mmol/L (ref 3.5–5.1)
SODIUM: 136 mmol/L (ref 135–145)

## 2017-01-10 LAB — PROTIME-INR
INR: 2.11
PROTHROMBIN TIME: 23.5 s — AB (ref 11.4–15.2)

## 2017-01-10 MED ORDER — WARFARIN 0.5 MG HALF TABLET
0.5000 mg | ORAL_TABLET | Freq: Once | ORAL | Status: DC
  Filled 2017-01-10: qty 1

## 2017-01-10 NOTE — Progress Notes (Signed)
   01/10/17 1420  Acute Rehab PT Goals  Time For Goal Achievement 01/26/17 (per communication with supervising PT)

## 2017-01-10 NOTE — Progress Notes (Signed)
[place] for warfarin Indication: atrial fibrillation  Allergies  Allergen Reactions  . Ace Inhibitors Cough  . Amlodipine Swelling    UNSPECIFIED EDEMA   . Codeine Rash    Patient Measurements: Height: 5\' 8"  (172.7 cm) Weight: 190 lb 9.6 oz (86.5 kg) IBW/kg (Calculated) : 63.9  Vital Signs: Temp: 98.6 F (37 C) (11/06 0652) Temp Source: Oral (11/06 0652) BP: 99/56 (11/06 0652) Pulse Rate: 61 (11/06 0652)  Labs: Recent Labs    01/08/17 0519 01/09/17 0400 01/10/17 0751  HGB 8.8* 8.7*  --   HCT 29.1* 29.6*  --   PLT 186 175  --   LABPROT 32.2* 23.6* 23.5*  INR 3.17 2.12 2.11  CREATININE 2.82* 3.10* 3.16*    Assessment: Warfarin pta for h/o afib. Pt has been sensitive to warfarin in this past. Possible DDI: amiodarone but on PTA w/warf & amio dose is lower now. Warfarin was intentionally held on 11/4, but unfortunately patient did not receive warfarin last night, for unknown reasons. INR still therapeutic at 2.1 today.   Goal of Therapy:  INR 2-3 Monitor platelets by anticoagulation protocol: Yes    Plan:  Warfarin 0.5 mg po x1 Monitor INR daily     Harvel Quale 01/10/2017,9:54 AM

## 2017-01-10 NOTE — Progress Notes (Signed)
Inpatient Diabetes Program Recommendations  AACE/ADA: New Consensus Statement on Inpatient Glycemic Control (2015)  Target Ranges:  Prepandial:   less than 140 mg/dL      Peak postprandial:   less than 180 mg/dL (1-2 hours)      Critically ill patients:  140 - 180 mg/dL   Lab Results  Component Value Date   GLUCAP 95 01/10/2017   HGBA1C 8.8 (H) 10/31/2016    Review of Glycemic Control Results for [name], [name] (MRN [id number]) as of 01/10/2017 12:21  Ref. Range 01/09/2017 13:04 01/09/2017 16:40 01/09/2017 21:03 01/10/2017 07:39 01/10/2017 11:53  Glucose-Capillary Latest Ref Range: 65 - 99 mg/dL 73 191 (H) 209 (H) 65 95  Home DM Meds: Lantus 40 units QHS Novolog 10 units TID  Current Insulin Orders: Lantus 23 units QHS Novolog Moderate Correction Scale/ SSI (0-15 units) TID AC and HS Inpatient Diabetes Program Recommendations:   Note low blood sugars this morning. Consider reducing Lantus to 20 units q HS.   Thanks, Adah Perl, RN, BC-ADM Inpatient Diabetes Coordinator Pager [phone] (8a-5p)

## 2017-01-10 NOTE — Progress Notes (Signed)
Tried to reach primary provider but no return call.  Patient MUST BE OFF plavix for 5 days prior to tunneling her catheter.  Coumadin has been held, but plavix has not.  The earliest we could now tunnel her catheter would be Monday if no further plavix is given.   Cartrell Bentsen E 3:09 PM 01/10/2017

## 2017-01-10 NOTE — Progress Notes (Signed)
Physical Therapy Treatment Patient Details Name: [name] MRN: [id number] DOB: 04/30/1945 Today's Date: 01/10/2017    History of Present Illness 71 y.o. female PMH of  CAD (s/p NSTEMI in 06/2015 with DES to PDA and RCA at that time, subtotal occlusion of LCX treated medically; repeat cath 11/2015 with stable disease; inferolateral scar w/o ischemia), chronic combined systolic and diastolic CHF (EF 34-19% by echo in 11/2016), paroxysmal atrial fibrillation, right subclavian stenosis (s/p intervention), IDDM, HTN, HLD, and CKD Stage IV admitted on 12/23/2016 for worsening edema and fatigue. Pt being treated for acute on chronic combined systolic and diastolic CHF, acute on chronic Stage 4 CKD, paroxysmal A-fib, CAD, anemia, hypoprothrobinemia, and IDDM.     PT Comments     Pt had urinated while sitting EOB and eating breakfast. Pt reports that she couldn't tell that she had to go to the restroom. Helped pt clean up and change. Pt unable to stand erect  With RW due to B knee buckling and LE weakness. Mod assist +2 for transfer from bed to recliner. Pt took a couple steps but was very unsteady. Had to use Stedy lift to finish cleaning pt once pt at recliner. Positioned pt in recliner at end of session. Pt will benefit from PT to help increase strength and endurance.   Follow Up Recommendations  SNF     Equipment Recommendations  None recommended by PT    Recommendations for Other Services       Precautions / Restrictions Precautions Precautions: Fall Restrictions Weight Bearing Restrictions: No    Mobility  Bed Mobility Overal bed mobility: Needs Assistance             General bed mobility comments: sitting on EOB upon arrival  Transfers Overall transfer level: Needs assistance Equipment used: (first trial performed with RW; additional trial for pericare performed with Steady due to deceased quad strength in B LE. Pt presented with LE weakness with B knee  buckling) Transfers: Sit to/from Stand Sit to Stand: Mod assist;+2 physical assistance         General transfer comment: Pt performed sit to stand with mod assist +2 with physical assistance. VC for proper hand placement and erect posutre. Pt presented with B knee buckling due to weakness in B LE  Ambulation/Gait Ambulation/Gait assistance: Mod assist;+2 physical assistance Ambulation Distance (Feet): 3 Feet Assistive device: Rolling walker (2 wheeled) Gait Pattern/deviations: Step-to pattern;Shuffle;Trunk flexed;Decreased step length - right;Decreased step length - left Gait velocity: decreased   General Gait Details: cues for erect posture. B knee buckling. Pt mod assist to take few steps from bed to recliner.    Stairs            Wheelchair Mobility    Modified Rankin (Stroke Patients Only)       Balance               Standing balance comment: Relies on UE support. Pt fatigued quickly and had severe B knee buckling when pt standing to get cleaned up.                            Cognition Arousal/Alertness: Lethargic Behavior During Therapy: WFL for tasks assessed/performed Overall Cognitive Status: Within Functional Limits for tasks assessed                                 General Comments: Pt  fatiqued and reports feeling very weak from dialysis      Exercises General Exercises - Lower Extremity Long Arc Quad: AROM;Both;10 reps;Seated Hip Flexion/Marching: AROM;Both;10 reps;Seated    General Comments        Pertinent Vitals/Pain Faces Pain Scale: Hurts little more Pain Intervention(s): Monitored during session;Repositioned    Home Living                      Prior Function            PT Goals (current goals can now be found in the care plan section) Acute Rehab PT Goals Time For Goal Achievement: 01/26/17(per communication with supervising PT) Progress towards PT goals: Not progressing toward goals -  comment(Pt not progressing towards goals due to overall decline and weakness)    Frequency    Min 3X/week      PT Plan Current plan remains appropriate    Co-evaluation              AM-PAC PT "6 Clicks" Daily Activity  Outcome Measure  Difficulty turning over in bed (including adjusting bedclothes, sheets and blankets)?: A Lot Difficulty moving from lying on back to sitting on the side of the bed? : A Lot Difficulty sitting down on and standing up from a chair with arms (e.g., wheelchair, bedside commode, etc,.)?: Unable Help needed moving to and from a bed to chair (including a wheelchair)?: Total Help needed walking in hospital room?: Total Help needed climbing 3-5 steps with a railing? : Total 6 Click Score: 8    End of Session Equipment Utilized During Treatment: Gait belt Activity Tolerance: Patient limited by fatigue Patient left: in chair;with call bell/phone within reach;with chair alarm set Nurse Communication: Mobility status PT Visit Diagnosis: Unsteadiness on feet (R26.81);Other abnormalities of gait and mobility (R26.89);Muscle weakness (generalized) (M62.81)     Time: 4536-4680 PT Time Calculation (min) (ACUTE ONLY): 32 min  Charges:  $Therapeutic Activity: 23-37 mins                    G Codes:      Fransisca Connors, SPTA    Fransisca Connors 01/10/2017, 3:20 PM

## 2017-01-10 NOTE — Clinical Social Work Note (Addendum)
CSW spoke with admissions coordinator at [facility]. She pulled up patient's insurance and confirmed that BCBS is primary and Medicare is secondary. They will need to get authorization prior to admission which takes 1-2 days. CSW met with patient. She is hoping to build her strength to return home but is agreeable to SNF if she needs it. Patient states that her legs have been much weaker since yesterday and hopes to speak with the nephrologist about it.   Sarah Boswell, CSW [phone]  1:23 pm Per MD, patient should get tunnelled catheter on Friday and hopeful for discharge on Saturday. SNF notified so they can start authorization in time.  Sarah Boswell, CSW [phone]  

## 2017-01-10 NOTE — Discharge Instructions (Addendum)
Please make appointment with your cardiologist to have INR check on Friday.   Information on my medicine - Coumadin   (Warfarin)  Why was Coumadin prescribed for you? Coumadin was prescribed for you because you have a blood clot or a medical condition that can cause an increased risk of forming blood clots. Blood clots can cause serious health problems by blocking the flow of blood to the heart, lung, or brain. Coumadin can prevent harmful blood clots from forming.   What test will check on my response to Coumadin? While on Coumadin (warfarin) you will need to have an INR test regularly to ensure that your dose is keeping you in the desired range. The INR (international normalized ratio) number is calculated from the result of the laboratory test called prothrombin time (PT).  If an INR APPOINTMENT HAS NOT ALREADY BEEN MADE FOR YOU please schedule an appointment to have this lab work done by your health care provider within 7 days. Your INR goal is usually a number between:  2 to 3 or your provider may give you a more narrow range like 2-2.5.  Ask your health care provider during an office visit what your goal INR is.  What  do you need to  know  About  COUMADIN? Take Coumadin (warfarin) exactly as prescribed by your healthcare provider about the same time each day.  DO NOT stop taking without talking to the doctor who prescribed the medication.  Stopping without other blood clot prevention medication to take the place of Coumadin may increase your risk of developing a new clot or stroke.  Get refills before you run out.  What do you do if you miss a dose? If you miss a dose, take it as soon as you remember on the same day then continue your regularly scheduled regimen the next day.  Do not take two doses of Coumadin at the same time.  Important Safety Information A possible side effect of Coumadin (Warfarin) is an increased risk of bleeding. You should call your healthcare provider right  away if you experience any of the following: ? Bleeding from an injury or your nose that does not stop. ? Unusual colored urine (red or dark brown) or unusual colored stools (red or black). ? Unusual bruising for unknown reasons. ? A serious fall or if you hit your head (even if there is no bleeding).  Some foods or medicines interact with Coumadin (warfarin) and might alter your response to warfarin. To help avoid this: ? Eat a balanced diet, maintaining a consistent amount of Vitamin K. ? Notify your provider about major diet changes you plan to make. ? Avoid alcohol or limit your intake to 1 drink for women and 2 drinks for men per day. (1 drink is 5 oz. wine, 12 oz. beer, or 1.5 oz. liquor.)  Make sure that ANY health care provider who prescribes medication for you knows that you are taking Coumadin (warfarin).  Also make sure the healthcare provider who is monitoring your Coumadin knows when you have started a new medication including herbals and non-prescription products.  Coumadin (Warfarin)  Major Drug Interactions  Increased Warfarin Effect Decreased Warfarin Effect  Alcohol (large quantities) Antibiotics (esp. Septra/Bactrim, Flagyl, Cipro) Amiodarone (Cordarone) Aspirin (ASA) Cimetidine (Tagamet) Megestrol (Megace) NSAIDs (ibuprofen, naproxen, etc.) Piroxicam (Feldene) Propafenone (Rythmol SR) Propranolol (Inderal) Isoniazid (INH) Posaconazole (Noxafil) Barbiturates (Phenobarbital) Carbamazepine (Tegretol) Chlordiazepoxide (Librium) Cholestyramine (Questran) Griseofulvin Oral Contraceptives Rifampin Sucralfate (Carafate) Vitamin K   Coumadin (Warfarin) Major Herbal Interactions  Increased Warfarin Effect Decreased Warfarin Effect  Garlic Ginseng Ginkgo biloba Coenzyme Q10 Green tea St. Johns wort    Coumadin (Warfarin) FOOD Interactions  Eat a consistent number of servings per week of foods HIGH in Vitamin K (1 serving =  cup)  Collards (cooked, or  boiled & drained) Kale (cooked, or boiled & drained) Mustard greens (cooked, or boiled & drained) Parsley *serving size only =  cup Spinach (cooked, or boiled & drained) Swiss chard (cooked, or boiled & drained) Turnip greens (cooked, or boiled & drained)  Eat a consistent number of servings per week of foods MEDIUM-HIGH in Vitamin K (1 serving = 1 cup)  Asparagus (cooked, or boiled & drained) Broccoli (cooked, boiled & drained, or raw & chopped) Brussel sprouts (cooked, or boiled & drained) *serving size only =  cup Lettuce, raw (green leaf, endive, romaine) Spinach, raw Turnip greens, raw & chopped   These websites have more information on Coumadin (warfarin):  FailFactory.se; VeganReport.com.au;

## 2017-01-10 NOTE — Progress Notes (Signed)
[facility] ROUNDING NOTE   Subjective:   Interval History:  Lying comfortably in bed with no complaints   Objective:  Vital signs in last 24 hours:  Temp:  [97.3 F (36.3 C)-98.6 F (37 C)] 98.6 F (37 C) (11/06 5397) Pulse Rate:  [61-71] 61 (11/06 0652) Resp:  [17-18] 18 (11/06 0652) BP: (99-126)/(49-56) 99/56 (11/06 0652) SpO2:  [95 %-100 %] 95 % (11/06 0652) Weight:  [190 lb 4.1 oz (86.3 kg)-190 lb 9.6 oz (86.5 kg)] 190 lb 9.6 oz (86.5 kg) (11/06 6734)  Weight change: -10.6 oz (-0.3 kg) Filed Weights   01/09/17 0729 01/09/17 1147 01/10/17 0652  Weight: 195 lb 8.8 oz (88.7 kg) 190 lb 4.1 oz (86.3 kg) 190 lb 9.6 oz (86.5 kg)    Intake/Output: I/O last 3 completed shifts: In: 712.2 [P.O.:480; I.V.:232.2] Out: 2000 [Other:2000]   Intake/Output this shift:  Total I/O In: 120 [P.O.:120] Out: -   Gen: NAD HEENT: + JVD. R IJ nontunneled HD catheter in place CVS: Regular rate and rhythm  Resp: bilaterally decreased breath sounds at bases,with few basal crackles. Abd: soft, non tender, bowel sounds positive. Ext:3+lower extremity edema,slightly improved,left AVG with good bruit.     Basic Metabolic Panel: Recent Labs  Lab 01/06/17 0416 01/07/17 0649 01/08/17 0519 01/09/17 0400 01/10/17 0751  NA 134* 133* 137 136 136  K 3.2* 3.1* 3.2* 3.5 3.8  CL 95* 94* 102 99* 99*  CO2 27 29 26 28 29   GLUCOSE 206* 212* 255* 139* 90  BUN 84* 88* 51* 35* 23*  CREATININE 4.00* 4.18* 2.82* 3.10* 3.16*  CALCIUM 8.8* 8.7* 7.6* 8.3* 8.4*  PHOS 4.2 4.3 3.1 3.2 3.2    Liver Function Tests: Recent Labs  Lab 01/06/17 0416 01/07/17 0649 01/08/17 0519 01/09/17 0400 01/10/17 0751  ALBUMIN 2.6* 2.6* 2.2* 2.6* 2.5*   No results for input(s): LIPASE, AMYLASE in the last 168 hours. No results for input(s): AMMONIA in the last 168 hours.  CBC: Recent Labs  Lab 01/05/17 0615 01/06/17 0416 01/07/17 0649 01/08/17 0519 01/09/17 0400  WBC 3.7* 3.2* 3.8* 3.2*  3.5*  HGB 8.9* 8.6* 8.7* 8.8* 8.7*  HCT 29.0* 28.7* 29.6* 29.1* 29.6*  MCV 95.7 95.7 97.4 98.6 98.3  PLT 193 178 195 186 175    Cardiac Enzymes: No results for input(s): CKTOTAL, CKMB, CKMBINDEX, TROPONINI in the last 168 hours.  BNP: Invalid input(s): POCBNP  CBG: Recent Labs  Lab 01/08/17 2133 01/09/17 1304 01/09/17 1640 01/09/17 2103 01/10/17 0739  GLUCAP 217* 73 191* 209* 68    Microbiology: Results for orders placed or performed during the hospital encounter of 12/23/16  MRSA PCR Screening     Status: None   Collection Time: 12/24/16  1:39 AM  Result Value Ref Range Status   MRSA by PCR NEGATIVE NEGATIVE Final    Comment:        The GeneXpert MRSA Assay (FDA approved for NASAL specimens only), is one component of a comprehensive MRSA colonization surveillance program. It is not intended to diagnose MRSA infection nor to guide or monitor treatment for MRSA infections.   Surgical pcr screen     Status: None   Collection Time: 12/28/16  7:26 PM  Result Value Ref Range Status   MRSA, PCR NEGATIVE NEGATIVE Final   Staphylococcus aureus NEGATIVE NEGATIVE Final    Comment: (NOTE) The Xpert SA Assay (FDA approved for NASAL specimens in patients 41 years of age and older), is one component of a comprehensive  surveillance program. It is not intended to diagnose infection nor to guide or monitor treatment.     Coagulation Studies: Recent Labs    01/08/17 0519 01/09/17 0400 01/10/17 0751  LABPROT 32.2* 23.6* 23.5*  INR 3.17 2.12 2.11    Urinalysis: No results for input(s): COLORURINE, LABSPEC, PHURINE, GLUCOSEU, HGBUR, BILIRUBINUR, KETONESUR, PROTEINUR, UROBILINOGEN, NITRITE, LEUKOCYTESUR in the last 72 hours.  Invalid input(s): APPERANCEUR    Imaging: No results found.   Medications:   . sodium chloride    . sodium chloride 10 mL/hr at 01/09/17 0418  . sodium chloride    . sodium chloride     . ALPRAZolam  0.5 mg Oral QHS  . amiodarone  200  mg Oral Daily  . calcitRIOL  0.5 mcg Oral Daily  . clopidogrel  75 mg Oral Daily  . cyclobenzaprine  5 mg Oral QHS  . darbepoetin (ARANESP) injection - NON-DIALYSIS  150 mcg Subcutaneous Q Mon-1800  . fluticasone  1 spray Each Nare Daily  . insulin aspart  0-15 Units Subcutaneous TID WC  . insulin aspart  0-5 Units Subcutaneous QHS  . insulin glargine  23 Units Subcutaneous QHS  . isosorbide mononitrate  30 mg Oral BID  . latanoprost  1 drop Both Eyes QHS  . loratadine  10 mg Oral Daily  . meclizine  12.5 mg Oral BID  . mouth rinse  15 mL Mouth Rinse BID  . metoprolol succinate  25 mg Oral QHS  . pantoprazole  40 mg Oral Daily  . rosuvastatin  40 mg Oral q1800  . senna-docusate  2 tablet Oral BID  . sodium chloride flush  3 mL Intravenous Q12H  . warfarin  0.5 mg Oral ONCE-1800  . 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 Will arrange tunneling of catheter. She is taking coumadin and plavix and so these need to be discontinued prior to tunneling of catheter. I will proceed with dialysis 11/7 #4  HFrEF. Cardiology is following  Anemia. Hemoglobin stable, had 1 dose of Feraheme. Aranesp 150 per week.  Hypertension. BPs a little soft.  Afib. Rate controlled with amiodarone and beta blocker, on warfarin  DM. per primary     LOS: 18 Garrison Michie W @TODAY @11 :07 AM

## 2017-01-10 NOTE — Progress Notes (Signed)
Renal panel and protime drawn by IV nurse team

## 2017-01-10 NOTE — Progress Notes (Signed)
[facility] PROGRESS NOTE    Progress Note  KEIMORA SWARTOUT  SWN:[id number] DOB: 09-03-1945 DOA: 12/23/2016 PCP: [name], MD     Brief Narrative:   [name] is an 71 y.o. female with medical history significant for insulin-dependent diabetes mellitus, chronic kidney disease stage IV, chronic combined systolic/diastolic CHF, chronic atrial fibrillation, and coronary artery disease, presented to the ED at the direction of her nephrologist for evaluation of swelling and fatigue. She has been followed by cardiology and nephrology for worsening fluid status complicating worsening kidney function. She continued to be volume overloaded despite increase in her diuretics recently. Admitted with acute on chronic diastolic heart failure secondary to Acute on chronic renal failure.  Nephrology and cardiology following.  Unable to manage volume status despite aggressive IV diuresis.  Now deemed ESRD.  Temporary right IJ HD catheter placed and HD started on 01/07/17.   Assessment/Plan:   Principal Problem:   CKD (chronic kidney disease), stage IV (HCC) Active Problems:   Essential hypertension   Coronary artery disease   Acute on chronic combined systolic and diastolic CHF (congestive heart failure) (HCC)   Elevated troponin   DM type 2 causing vascular disease (HCC)   Pleural effusion on right   Anemia in chronic kidney disease   Atrial fibrillation (HCC)   Hypokalemia   Hypervolemia   Rhonchi  Acute on chronic combined systolic and diastolic decompensated heart failure: - 2D echo in November 11, 2016 showed an EF of 30% with grade 2 diastolic heart failure. - On admission cardiology and nephrology were consulted, despite extensive efforts to manage his diuresis patient did not make status viable progress diuresing. - After extensive discussion with family patient agreed to proceed with hemodialysis which was started on 01/07/2017 - Net ultrafiltration on November 5 was  about 6 L  Acute kidney injury superimposed on chronic renal disease stage IV leading to stage V now on hemodialysis: - Underwent AV graft placement on 12/30/2016. - Nephrology initiated dialysis on 01/07/2017, he has had 4 days of HD. - Appreciate nephrology's assistance, they will proceed with tunneled catheter before DC home.  Paroxysmal atrial fibrillation: - Cardiology was consulted they recommended to continue amiodarone, metoprolol, Plavix. - Coumadin was held we are to proceed with tunneled catheter. - INR today slowly trending down to 2.1.  Coronary artery disease: - Asymptomatic continue Plavix Imdur metoprolol and statins.  Diabetes mellitus type 2: - Uncontrolled with mild fluctuations continue current regimen, long-acting insulin plus sliding scale.  Hypokalemia: - To be repleated during HD.  Anemia chronic disease: - Seems to be stable continue iron supplementation.  Leukopenia: - Of unclear etiology follow-up with outpatient PCP.  Disposition: Patient was evaluated by physical therapy and occupational therapy they recommended skilled nursing facility, the patient cannot be discharged to skilled nursing facility once tunneled catheter has been placed and used.  DVT prophylaxis: coumadin Family Communication:None Disposition Plan/Barrier to D/C: home in 3-4 days Code Status:     Code Status Orders  (From admission, onward)        Start     Ordered   12/23/16 2108  Full code  Continuous     12/23/16 2119    Code Status History    Date Active Date Inactive Code Status Order ID Comments User Context   12/03/2016 11:29 12/12/2016 21:34 Full Code [id number]  Radene Gunning, NP Inpatient   11/21/2016 13:06 11/27/2016 22:43 Full Code [id number]  Annita Brod, MD Inpatient   11/10/2016  23:59 11/16/2016 20:25 Full Code [id number]  Phillips Grout, MD Inpatient   10/31/2016 17:15 11/01/2016 17:52 Full Code [id number]  Erline Hau, MD Inpatient   08/07/2016  15:05 08/08/2016 20:44 Full Code [id number]  Oswald Hillock, MD Inpatient   07/14/2016 09:55 07/16/2016 19:09 Full Code [id number]  Albertine Mertice, MD Inpatient   07/09/2016 08:54 07/12/2016 19:16 Full Code [id number]  Oswald Hillock, MD Inpatient   12/01/2015 01:19 12/03/2015 19:12 Full Code [id number]  Larey Dresser, MD Inpatient   10/10/2015 06:52 10/14/2015 18:57 Full Code [id number]  Phillips Grout, MD Inpatient   07/06/2015 16:27 07/12/2015 22:41 Full Code [id number]  Lavina Hamman, MD Inpatient        IV Access:    Peripheral IV   Procedures and diagnostic studies:   No results found.   Medical Consultants:    None.  Anti-Infectives:  None  Subjective:    Ladell Pier Rissler she relates she feels better, except for her weakness that seems not to be improving it is causing some frustration to her.  Objective:    Vitals:   01/09/17 1147 01/09/17 2112 01/09/17 2229 01/10/17 0652  BP: (!) 112/55 (!) 107/49 (!) 126/51 (!) 99/56  Pulse: 71 66  61  Resp: 17 18  18   Temp: (!) 97.3 F (36.3 C) 98.3 F (36.8 C)  98.6 F (37 C)  TempSrc: Oral Oral  Oral  SpO2: 98% 100%  95%  Weight: 86.3 kg (190 lb 4.1 oz)   86.5 kg (190 lb 9.6 oz)  Height:        Intake/Output Summary (Last 24 hours) at 01/10/2017 1225 Last data filed at 01/10/2017 0956 Gross per 24 hour  Intake 240 ml  Output -  Net 240 ml   Filed Weights   01/09/17 0729 01/09/17 1147 01/10/17 0652  Weight: 88.7 kg (195 lb 8.8 oz) 86.3 kg (190 lb 4.1 oz) 86.5 kg (190 lb 9.6 oz)    Exam: General exam: In no acute distress. Respiratory system: Good air movement clear to auscultation Cardiovascular system: S1 & S2 heard, RRR.  No appreciated JVD Gastrointestinal system: Positive bowel sounds soft nontender nondistended Central nervous system: Alert and oriented. No focal neurological deficits. Extremities: No lower extremity edema Skin: No rashes, lesions or ulcers Psychiatry: Treatment insight appeared normal, mood  and affect are appropriate.   Data Reviewed:    Labs: Basic Metabolic Panel: Recent Labs  Lab 01/06/17 0416 01/07/17 0649 01/08/17 0519 01/09/17 0400 01/10/17 0751  NA 134* 133* 137 136 136  K 3.2* 3.1* 3.2* 3.5 3.8  CL 95* 94* 102 99* 99*  CO2 27 29 26 28 29   GLUCOSE 206* 212* 255* 139* 90  BUN 84* 88* 51* 35* 23*  CREATININE 4.00* 4.18* 2.82* 3.10* 3.16*  CALCIUM 8.8* 8.7* 7.6* 8.3* 8.4*  PHOS 4.2 4.3 3.1 3.2 3.2   GFR Estimated Creatinine Clearance: 18.5 mL/min (A) (by C-G formula based on SCr of 3.16 mg/dL (H)). Liver Function Tests: Recent Labs  Lab 01/06/17 0416 01/07/17 0649 01/08/17 0519 01/09/17 0400 01/10/17 0751  ALBUMIN 2.6* 2.6* 2.2* 2.6* 2.5*   No results for input(s): LIPASE, AMYLASE in the last 168 hours. No results for input(s): AMMONIA in the last 168 hours. Coagulation profile Recent Labs  Lab 01/06/17 0416 01/07/17 0649 01/08/17 0519 01/09/17 0400 01/10/17 0751  INR 2.64 3.04 3.17 2.12 2.11    CBC: Recent Labs  Lab 01/05/17 0615 01/06/17 0416 01/07/17 0175 01/08/17 1025  01/09/17 0400  WBC 3.7* 3.2* 3.8* 3.2* 3.5*  HGB 8.9* 8.6* 8.7* 8.8* 8.7*  HCT 29.0* 28.7* 29.6* 29.1* 29.6*  MCV 95.7 95.7 97.4 98.6 98.3  PLT 193 178 195 186 175   Cardiac Enzymes: No results for input(s): CKTOTAL, CKMB, CKMBINDEX, TROPONINI in the last 168 hours. BNP (last 3 results) No results for input(s): PROBNP in the last 8760 hours. CBG: Recent Labs  Lab 01/09/17 1304 01/09/17 1640 01/09/17 2103 01/10/17 0739 01/10/17 1153  GLUCAP 73 191* 209* 65 95   D-Dimer: No results for input(s): DDIMER in the last 72 hours. Hgb A1c: No results for input(s): HGBA1C in the last 72 hours. Lipid Profile: No results for input(s): CHOL, HDL, LDLCALC, TRIG, CHOLHDL, LDLDIRECT in the last 72 hours. Thyroid function studies: No results for input(s): TSH, T4TOTAL, T3FREE, THYROIDAB in the last 72 hours.  Invalid input(s): FREET3 Anemia work up: No results  for input(s): VITAMINB12, FOLATE, FERRITIN, TIBC, IRON, RETICCTPCT in the last 72 hours. Sepsis Labs: Recent Labs  Lab 01/06/17 0416 01/07/17 0649 01/08/17 0519 01/09/17 0400  WBC 3.2* 3.8* 3.2* 3.5*   Microbiology No results found for this or any previous visit (from the past 240 hour(s)).   Medications:   . ALPRAZolam  0.5 mg Oral QHS  . amiodarone  200 mg Oral Daily  . calcitRIOL  0.5 mcg Oral Daily  . clopidogrel  75 mg Oral Daily  . cyclobenzaprine  5 mg Oral QHS  . darbepoetin (ARANESP) injection - NON-DIALYSIS  150 mcg Subcutaneous Q Mon-1800  . fluticasone  1 spray Each Nare Daily  . insulin aspart  0-15 Units Subcutaneous TID WC  . insulin aspart  0-5 Units Subcutaneous QHS  . insulin glargine  23 Units Subcutaneous QHS  . isosorbide mononitrate  30 mg Oral BID  . latanoprost  1 drop Both Eyes QHS  . loratadine  10 mg Oral Daily  . meclizine  12.5 mg Oral BID  . mouth rinse  15 mL Mouth Rinse BID  . metoprolol succinate  25 mg Oral QHS  . pantoprazole  40 mg Oral Daily  . rosuvastatin  40 mg Oral q1800  . senna-docusate  2 tablet Oral BID  . sodium chloride flush  3 mL Intravenous Q12H  . warfarin  0.5 mg Oral ONCE-1800  . Warfarin - Pharmacist Dosing Inpatient   Does not apply q1800   Continuous Infusions: . sodium chloride    . sodium chloride 10 mL/hr at 01/09/17 0418  . sodium chloride    . sodium chloride        LOS: 18 days   Charlynne Cousins  [facility] Pager [phone]  *Please refer to [email or web address], password [email or web address] to get updated schedule on who will round on this patient, as hospitalists switch teams weekly. If 7PM-7AM, please contact night-coverage at [email or web address], password [email or web address] for any overnight needs.  01/10/2017, 12:25 PM

## 2017-01-11 LAB — RENAL FUNCTION PANEL
Albumin: 2.4 g/dL — ABNORMAL LOW (ref 3.5–5.0)
Anion gap: 8 (ref 5–15)
BUN: 33 mg/dL — AB (ref 6–20)
CHLORIDE: 101 mmol/L (ref 101–111)
CO2: 27 mmol/L (ref 22–32)
CREATININE: 4.03 mg/dL — AB (ref 0.44–1.00)
Calcium: 8.7 mg/dL — ABNORMAL LOW (ref 8.9–10.3)
GFR calc Af Amer: 12 mL/min — ABNORMAL LOW (ref >60)
GFR, EST NON AFRICAN AMERICAN: 10 mL/min — AB (ref >60)
GLUCOSE: 125 mg/dL — AB (ref 65–99)
POTASSIUM: 3.8 mmol/L (ref 3.5–5.1)
Phosphorus: 4.2 mg/dL (ref 2.5–4.6)
Sodium: 136 mmol/L (ref 135–145)

## 2017-01-11 LAB — CBC
HEMATOCRIT: 31.5 % — AB (ref 36.0–46.0)
HEMOGLOBIN: 9.3 g/dL — AB (ref 12.0–15.0)
MCH: 29 pg (ref 26.0–34.0)
MCHC: 29.5 g/dL — AB (ref 30.0–36.0)
MCV: 98.1 fL (ref 78.0–100.0)
Platelets: 131 10*3/uL — ABNORMAL LOW (ref 150–400)
RBC: 3.21 MIL/uL — AB (ref 3.87–5.11)
RDW: 20.2 % — ABNORMAL HIGH (ref 11.5–15.5)
WBC: 3.5 10*3/uL — AB (ref 4.0–10.5)

## 2017-01-11 LAB — GLUCOSE, CAPILLARY
GLUCOSE-CAPILLARY: 72 mg/dL (ref 65–99)
GLUCOSE-CAPILLARY: 117 mg/dL — AB (ref 65–99)
GLUCOSE-CAPILLARY: 96 mg/dL (ref 65–99)
GLUCOSE-CAPILLARY: 150 mg/dL — AB (ref 65–99)

## 2017-01-11 LAB — PROTIME-INR
INR: 1.98
PROTHROMBIN TIME: 22.3 s — AB (ref 11.4–15.2)

## 2017-01-11 LAB — HEPARIN LEVEL (UNFRACTIONATED): Heparin Unfractionated: 0.47 IU/mL (ref 0.30–0.70)

## 2017-01-11 MED ORDER — SODIUM CHLORIDE 0.9 % IV SOLN: 100.0000 mL | INTRAVENOUS | Status: DC | PRN

## 2017-01-11 MED ORDER — INSULIN ASPART 100 UNIT/ML ~~LOC~~ SOLN
0.0000 [IU] | Freq: Three times a day (TID) | SUBCUTANEOUS | Status: DC
  Administered 2017-01-12: 3 [IU] via SUBCUTANEOUS
  Administered 2017-01-12: 2 [IU] via SUBCUTANEOUS
  Administered 2017-01-13 – 2017-01-15 (×5): 3 [IU] via SUBCUTANEOUS
  Administered 2017-01-16: 5 [IU] via SUBCUTANEOUS
  Administered 2017-01-17: 2 [IU] via SUBCUTANEOUS

## 2017-01-11 MED ORDER — HEPARIN SODIUM (PORCINE) 1000 UNIT/ML DIALYSIS: 1000.0000 [IU] | INTRAMUSCULAR | Status: DC | PRN

## 2017-01-11 MED ORDER — ALTEPLASE 2 MG IJ SOLR: 2.0000 mg | Freq: Once | INTRAMUSCULAR | Status: DC | PRN

## 2017-01-11 MED ORDER — HEPARIN (PORCINE) IN NACL 100-0.45 UNIT/ML-% IJ SOLN
1000.0000 [IU]/h | INTRAMUSCULAR | Status: DC
  Administered 2017-01-11 – 2017-01-15 (×4): 1000 [IU]/h via INTRAVENOUS
  Filled 2017-01-11 (×5): qty 250

## 2017-01-11 MED ORDER — PENTAFLUOROPROP-TETRAFLUOROETH EX AERO: 1.0000 "application " | INHALATION_SPRAY | CUTANEOUS | Status: DC | PRN

## 2017-01-11 MED ORDER — LIDOCAINE-PRILOCAINE 2.5-2.5 % EX CREA: 1.0000 "application " | TOPICAL_CREAM | CUTANEOUS | Status: DC | PRN

## 2017-01-11 MED ORDER — LIDOCAINE HCL (PF) 1 % IJ SOLN: 5.0000 mL | INTRAMUSCULAR | Status: DC | PRN

## 2017-01-11 NOTE — Plan of Care (Signed)
Nutrition Education Note  RD consulted for Renal Education.   Spoke with pt, who reports she follows a low sodium, DM diet at home. She generally eats 3 meals per day: Breakfast- oatmeal and hard boiled egg, Lunch: sandwich with low sodium potato chips, and Dinner: meat, starch, and vegetable. Pt does not salt food, however, uses low sodium salt substitutes and dry herbs and seasonings. Pt also mainly consumes clear diet soft drinks, black coffee, and water.   Pt reports she does most of her cooking at home, with her daughter occasionally assisting. She also shares she has a close friend who has been on HD for 18 years, who she will rely on for support. Pt was engaged during session, however, appeared somewhat overwhelmed with information presented.   Provided "Food Pyramid for Healthy Eating With Kidney Disease" handout to patient/family. Reviewed food groups and provided written recommended serving sizes specifically determined for patient's current nutritional status.   Explained why diet restrictions are needed and provided lists of foods to limit/avoid that are high potassium, sodium, and phosphorus. Provided specific recommendations on safer alternatives of these foods. Strongly encouraged compliance of this diet.   Discussed importance of protein intake at each meal and snack. Provided examples of how to maximize protein intake throughout the day. Discussed need for fluid restriction with dialysis, importance of minimizing weight gain between HD treatments, and renal-friendly beverage options.  Encouraged pt to discuss specific diet questions/concerns with RD at HD outpatient facility. Teach back method used.  Expect fair to good compliance.  Body mass index is 29.24 kg/m. Pt meets criteria for overweight based on current BMI.  Current diet order is renal with 1200 ml fluid restriction, patient is consuming approximately 45-85% of meals at this time. Labs and medications reviewed. No  further nutrition interventions warranted at this time. RD contact information provided. If additional nutrition issues arise, please re-consult RD.  Tyger Wichman A. Jimmye Norman, RD, LDN, CDE Pager: [phone] After hours Pager: [phone]

## 2017-01-11 NOTE — Progress Notes (Signed)
[facility] for heparin; warfarin on hold Indication: atrial fibrillation  Allergies  Allergen Reactions  . Ace Inhibitors Cough  . Amlodipine Swelling    UNSPECIFIED EDEMA   . Codeine Rash    Patient Measurements: Height: 5\' 8"  (172.7 cm) Weight: 192 lb 4.8 oz (87.2 kg)(Scale C) IBW/kg (Calculated) : 63.9  Vital Signs: Temp: 98.5 F (36.9 C) (11/07 0625) Temp Source: Oral (11/07 0625) BP: 100/59 (11/07 0625) Pulse Rate: 59 (11/07 0625)  Labs: Recent Labs    01/09/17 0400 01/10/17 0751 01/11/17 0347  HGB 8.7*  --   --   HCT 29.6*  --   --   PLT 175  --   --   LABPROT 23.6* 23.5* 22.3*  INR 2.12 2.11 1.98  CREATININE 3.10* 3.16* 4.03*    Assessment: On warfarin PTA for Afib - currently on hold for [facility] placement. Pharmacy consulted to begin heparin drip when INR <2.  INR is 1.98 this morning. No bleeding noted, Hgb low stable in 8's, platelets are normal.  Goal of Therapy:  Heparin level 0.3-0.7 units/ml Monitor platelets by anticoagulation protocol: Yes   Plan:  Begin heparin drip at 1000 units/hr with no bolus 8 hr heparin level Daily heparin level and CBC Monitor for s/sx of bleeding   Renold Genta, PharmD, BCPS Clinical Pharmacist Phone for today - [place] - [phone] 01/11/2017 10:06 AM

## 2017-01-11 NOTE — Progress Notes (Signed)
[facility] PROGRESS NOTE    Progress Note  TANEEKA [name]  YSA:[id number] DOB: 08/29/1945 DOA: 12/23/2016 PCP: [name], MD     Brief Narrative:   [name] is an 71 y.o. female with medical history significant for insulin-dependent diabetes mellitus, chronic kidney disease stage IV, chronic combined systolic/diastolic CHF, chronic atrial fibrillation, and coronary artery disease, presented to the ED at the direction of her nephrologist for evaluation of swelling and fatigue. She has been followed by cardiology and nephrology for worsening fluid status complicating worsening kidney function. She continued to be volume overloaded despite increase in her diuretics recently. Admitted with acute on chronic diastolic heart failure secondary to Acute on chronic renal failure.  Nephrology and cardiology following.  Unable to manage volume status despite aggressive IV diuresis.  Now deemed ESRD.  Temporary right IJ HD catheter placed and HD started on 01/07/17.     Assessment/Plan:   Principal Problem:   CKD (chronic kidney disease), stage IV (HCC) Active Problems:   Essential hypertension   Coronary artery disease   Acute on chronic combined systolic and diastolic CHF (congestive heart failure) (HCC)   Elevated troponin   DM type 2 causing vascular disease (HCC)   Pleural effusion on right   Anemia in chronic kidney disease   Atrial fibrillation (HCC)   Hypokalemia   Hypervolemia   Rhonchi  Acute on chronic combined systolic and diastolic decompensated heart failure: - 2D echo in November 11, 2016 showed an EF of 30% with grade 2 diastolic heart failure. - On admission cardiology and nephrology were consulted, despite extensive efforts to manage his diuresis patient did not make status viable progress diuresing. - After extensive discussion with family patient agreed to proceed with hemodialysis which was started on 01/07/2017 - volume manage with HD  Acute kidney  injury superimposed on chronic renal disease stage IV leading to stage V now on hemodialysis: - Underwent AV graft placement on 12/30/2016. - Nephrology initiated dialysis on 01/07/2017, he has had 4 days of HD. - Appreciate nephrology's assistance, they will proceed with tunneled catheter before DC home. -need tunnel catheter. Hold plavix and coumadin.   Paroxysmal atrial fibrillation: - Cardiology was consulted they recommended to continue amiodarone, metoprolol, Plavix. - Coumadin was held we are to proceed with tunneled catheter. - INR 1.9---hold coumadin, start heparin for  bridge.   Coronary artery disease: - Asymptomatic continue with  mdur metoprolol and statins. -hold Plavix., for tunnel catheter  Diabetes mellitus type 2: - Uncontrolled with mild fluctuations continue current regimen, long-acting insulin plus sliding scale.  Hypokalemia: - To be repleated during HD.  Anemia chronic disease: - Seems to be stable continue iron supplementation.  Leukopenia: - Of unclear etiology follow-up with outpatient PCP.  Disposition: Patient was evaluated by physical therapy and occupational therapy they recommended skilled nursing facility, the patient cannot be discharged to skilled nursing facility once tunneled catheter has been placed and used.  DVT prophylaxis: coumadin Family Communication:None Disposition Plan/Barrier to D/C: home in 3-4 days Code Status:     Code Status Orders  (From admission, onward)        Start     Ordered   12/23/16 2108  Full code  Continuous     12/23/16 2119    Code Status History    Date Active Date Inactive Code Status Order ID Comments User Context   12/03/2016 11:29 12/12/2016 21:34 Full Code [id number]  Radene Gunning, NP Inpatient   11/21/2016 13:06 11/27/2016  22:43 Full Code [id number]  Annita Brod, MD Inpatient   11/10/2016 23:59 11/16/2016 20:25 Full Code [id number]  Phillips Grout, MD Inpatient   10/31/2016 17:15 11/01/2016 17:52  Full Code [id number]  Erline Hau, MD Inpatient   08/07/2016 15:05 08/08/2016 20:44 Full Code [id number]  Oswald Hillock, MD Inpatient   07/14/2016 09:55 07/16/2016 19:09 Full Code [id number]  Albertine Hedaya, MD Inpatient   07/09/2016 08:54 07/12/2016 19:16 Full Code [id number]  Oswald Hillock, MD Inpatient   12/01/2015 01:19 12/03/2015 19:12 Full Code [id number]  Larey Dresser, MD Inpatient   10/10/2015 06:52 10/14/2015 18:57 Full Code [id number]  Phillips Grout, MD Inpatient   07/06/2015 16:27 07/12/2015 22:41 Full Code [id number]  Lavina Hamman, MD Inpatient        IV Access:    Peripheral IV   Procedures and diagnostic studies:   No results found.   Medical Consultants:    None.  Anti-Infectives:  None  Subjective:    Ladell Pier Gravley she relates she feels better, except for her weakness that seems not to be improving it is causing some frustration to her.  Objective:    Vitals:   01/11/17 1153 01/11/17 1345 01/11/17 1400 01/11/17 1405  BP: (!) 107/44 (!) 116/55 (!) 118/54 111/64  Pulse: 63 64 64 64  Resp: 20 (!) 21    Temp: 98.5 F (36.9 C) 98.6 F (37 C)    TempSrc: Oral Oral    SpO2: 100% 100%    Weight:  88.8 kg (195 lb 12.3 oz)    Height:        Intake/Output Summary (Last 24 hours) at 01/11/2017 1441 Last data filed at 01/11/2017 1400 Gross per 24 hour  Intake 540 ml  Output -  Net 540 ml   Filed Weights   01/10/17 0652 01/11/17 0625 01/11/17 1345  Weight: 86.5 kg (190 lb 9.6 oz) 87.2 kg (192 lb 4.8 oz) 88.8 kg (195 lb 12.3 oz)    Exam: General exam: No Acute distress.  Respiratory system: CTA Cardiovascular system: S 1, S 2 RRR Gastrointestinal system: BS positive, no tenderness, no rigidity  Central nervous system: non focal.  Extremities: no edema Skin: No rashes, lesions or ulcers    Data Reviewed:    Labs: Basic Metabolic Panel: Recent Labs  Lab 01/07/17 0649 01/08/17 0519 01/09/17 0400 01/10/17 0751 01/11/17 0347  NA  133* 137 136 136 136  K 3.1* 3.2* 3.5 3.8 3.8  CL 94* 102 99* 99* 101  CO2 29 26 28 29 27   GLUCOSE 212* 255* 139* 90 125*  BUN 88* 51* 35* 23* 33*  CREATININE 4.18* 2.82* 3.10* 3.16* 4.03*  CALCIUM 8.7* 7.6* 8.3* 8.4* 8.7*  PHOS 4.3 3.1 3.2 3.2 4.2   GFR Estimated Creatinine Clearance: 14.7 mL/min (A) (by C-G formula based on SCr of 4.03 mg/dL (H)). Liver Function Tests: Recent Labs  Lab 01/07/17 0649 01/08/17 0519 01/09/17 0400 01/10/17 0751 01/11/17 0347  ALBUMIN 2.6* 2.2* 2.6* 2.5* 2.4*   No results for input(s): LIPASE, AMYLASE in the last 168 hours. No results for input(s): AMMONIA in the last 168 hours. Coagulation profile Recent Labs  Lab 01/07/17 0649 01/08/17 0519 01/09/17 0400 01/10/17 0751 01/11/17 0347  INR 3.04 3.17 2.12 2.11 1.98    CBC: Recent Labs  Lab 01/05/17 0615 01/06/17 0416 01/07/17 0649 01/08/17 0519 01/09/17 0400  WBC 3.7* 3.2* 3.8* 3.2* 3.5*  HGB 8.9* 8.6* 8.7* 8.8* 8.7*  HCT 29.0*  28.7* 29.6* 29.1* 29.6*  MCV 95.7 95.7 97.4 98.6 98.3  PLT 193 178 195 186 175   Cardiac Enzymes: No results for input(s): CKTOTAL, CKMB, CKMBINDEX, TROPONINI in the last 168 hours. BNP (last 3 results) No results for input(s): PROBNP in the last 8760 hours. CBG: Recent Labs  Lab 01/10/17 1153 01/10/17 1656 01/10/17 2054 01/11/17 0743 01/11/17 1113  GLUCAP 95 122* 181* 72 117*   D-Dimer: No results for input(s): DDIMER in the last 72 hours. Hgb A1c: No results for input(s): HGBA1C in the last 72 hours. Lipid Profile: No results for input(s): CHOL, HDL, LDLCALC, TRIG, CHOLHDL, LDLDIRECT in the last 72 hours. Thyroid function studies: No results for input(s): TSH, T4TOTAL, T3FREE, THYROIDAB in the last 72 hours.  Invalid input(s): FREET3 Anemia work up: No results for input(s): VITAMINB12, FOLATE, FERRITIN, TIBC, IRON, RETICCTPCT in the last 72 hours. Sepsis Labs: Recent Labs  Lab 01/06/17 0416 01/07/17 0649 01/08/17 0519 01/09/17 0400    WBC 3.2* 3.8* 3.2* 3.5*   Microbiology No results found for this or any previous visit (from the past 240 hour(s)).   Medications:   . ALPRAZolam  0.5 mg Oral QHS  . amiodarone  200 mg Oral Daily  . calcitRIOL  0.5 mcg Oral Daily  . cyclobenzaprine  5 mg Oral QHS  . darbepoetin (ARANESP) injection - NON-DIALYSIS  150 mcg Subcutaneous Q Mon-1800  . fluticasone  1 spray Each Nare Daily  . insulin aspart  0-15 Units Subcutaneous TID WC  . insulin aspart  0-5 Units Subcutaneous QHS  . insulin glargine  23 Units Subcutaneous QHS  . isosorbide mononitrate  30 mg Oral BID  . latanoprost  1 drop Both Eyes QHS  . loratadine  10 mg Oral Daily  . meclizine  12.5 mg Oral BID  . mouth rinse  15 mL Mouth Rinse BID  . metoprolol succinate  25 mg Oral QHS  . pantoprazole  40 mg Oral Daily  . rosuvastatin  40 mg Oral q1800  . senna-docusate  2 tablet Oral BID  . sodium chloride flush  3 mL Intravenous Q12H   Continuous Infusions: . sodium chloride    . sodium chloride 10 mL/hr at 01/09/17 0418  . sodium chloride    . sodium chloride    . heparin 1,000 Units/hr (01/11/17 1043)      LOS: 19 days   Niel Hummer A  [facility] Pager [phone]    01/11/2017, 2:41 PM

## 2017-01-11 NOTE — Progress Notes (Signed)
ANTICOAGULATION CONSULT NOTE - Follow Up Consult  Pharmacy Consult for Heparin (warfarin on hold) Indication: atrial fibrillation  Allergies  Allergen Reactions  . Ace Inhibitors Cough  . Amlodipine Swelling    UNSPECIFIED EDEMA   . Codeine Rash    Patient Measurements: Height: 5\' 8"  (172.7 cm) Weight: 190 lb 14.7 oz (86.6 kg) IBW/kg (Calculated) : 63.9  Vital Signs: Temp: 98.5 F (36.9 C) (11/07 1937) Temp Source: Oral (11/07 1937) BP: 115/64 (11/07 2137) Pulse Rate: 67 (11/07 1937)  Labs: Recent Labs    01/09/17 0400 01/10/17 0751 01/11/17 0347 01/11/17 1647 01/11/17 2130  HGB 8.7*  --   --  9.3*  --   HCT 29.6*  --   --  31.5*  --   PLT 175  --   --  131*  --   LABPROT 23.6* 23.5* 22.3*  --   --   INR 2.12 2.11 1.98  --   --   HEPARINUNFRC  --   --   --   --  0.47  CREATININE 3.10* 3.16* 4.03*  --   --     Estimated Creatinine Clearance: 14.5 mL/min (A) (by C-G formula based on SCr of 4.03 mg/dL (H)).  Assessment: Heparin while warfarin on hold for [facility] placement. Initial heparin level tonight is therapeutic at 0.47  Goal of Therapy:  Heparin level 0.3-0.7 units/ml Monitor platelets by anticoagulation protocol: Yes   Plan:  Cont heparin at 1000 units/hr Heparin level with AM labs  Narda Bonds 01/11/2017,11:00 PM

## 2017-01-11 NOTE — Progress Notes (Signed)
PT Cancellation Note  Patient Details Name: [name] MRN: [id number] DOB: 09/03/1945   Cancelled Treatment:    Reason Eval/Treat Not Completed: Other (comment). Pt initially declining PT secondary to receiving a phone call. PT checked back in ~25 min later, and pt declining PT again secondary to pending arrival of transport to HD. Will follow-up this afternoon as schedule permits.  Mabeline Caras, PT, DPT [facility] Services  Pager: [place] 01/11/2017, 1:29 PM

## 2017-01-11 NOTE — Progress Notes (Signed)
Accepted at [facility] 1st treatment is Thursday 01/12/17 at 1:00pm .Schedule is Tuesday,Thursday.Saturday 2nd shift .Chairtime 1:30pm

## 2017-01-11 NOTE — Progress Notes (Signed)
Page attending to clarify diet order procedure postponed until monday

## 2017-01-11 NOTE — Progress Notes (Signed)
[facility] ROUNDING NOTE   Subjective:   Interval History: no complaints this morning resting in bed  No dyspnea  Objective:  Vital signs in last 24 hours:  Temp:  [98 F (36.7 C)-98.5 F (36.9 C)] 98.5 F (36.9 C) (11/07 0625) Pulse Rate:  [59-71] 59 (11/07 0625) Resp:  [18] 18 (11/07 0625) BP: (100-133)/(40-59) 105/40 (11/07 1051) SpO2:  [96 %-100 %] 100 % (11/07 0625) Weight:  [192 lb 4.8 oz (87.2 kg)] 192 lb 4.8 oz (87.2 kg) (11/07 0625)  Weight change: -4 oz (-1.473 kg) Filed Weights   01/09/17 1147 01/10/17 0652 01/11/17 0625  Weight: 190 lb 4.1 oz (86.3 kg) 190 lb 9.6 oz (86.5 kg) 192 lb 4.8 oz (87.2 kg)    Intake/Output: I/O last 3 completed shifts: In: 240 [P.O.:240] Out: -    Intake/Output this shift:  Total I/O In: 240 [P.O.:240] Out: -   Gen: NAD HEENT: + JVD. R IJ nontunneled HD catheter in place CVS: Regular rate and rhythm  Resp: bilaterally decreased breath sounds at bases,with few basal crackles. Abd: soft, non tender, bowel sounds positive. Ext:3+lower extremity edema,slightly improved,left AVG with good bruit.     Basic Metabolic Panel: Recent Labs  Lab 01/07/17 0649 01/08/17 0519 01/09/17 0400 01/10/17 0751 01/11/17 0347  NA 133* 137 136 136 136  K 3.1* 3.2* 3.5 3.8 3.8  CL 94* 102 99* 99* 101  CO2 29 26 28 29 27   GLUCOSE 212* 255* 139* 90 125*  BUN 88* 51* 35* 23* 33*  CREATININE 4.18* 2.82* 3.10* 3.16* 4.03*  CALCIUM 8.7* 7.6* 8.3* 8.4* 8.7*  PHOS 4.3 3.1 3.2 3.2 4.2    Liver Function Tests: Recent Labs  Lab 01/07/17 0649 01/08/17 0519 01/09/17 0400 01/10/17 0751 01/11/17 0347  ALBUMIN 2.6* 2.2* 2.6* 2.5* 2.4*   No results for input(s): LIPASE, AMYLASE in the last 168 hours. No results for input(s): AMMONIA in the last 168 hours.  CBC: Recent Labs  Lab 01/05/17 0615 01/06/17 0416 01/07/17 0649 01/08/17 0519 01/09/17 0400  WBC 3.7* 3.2* 3.8* 3.2* 3.5*  HGB 8.9* 8.6* 8.7* 8.8* 8.7*  HCT  29.0* 28.7* 29.6* 29.1* 29.6*  MCV 95.7 95.7 97.4 98.6 98.3  PLT 193 178 195 186 175    Cardiac Enzymes: No results for input(s): CKTOTAL, CKMB, CKMBINDEX, TROPONINI in the last 168 hours.  BNP: Invalid input(s): POCBNP  CBG: Recent Labs  Lab 01/10/17 1153 01/10/17 1656 01/10/17 2054 01/11/17 0743 01/11/17 1113  GLUCAP 95 122* 181* 59 117*    Microbiology: Results for orders placed or performed during the hospital encounter of 12/23/16  MRSA PCR Screening     Status: None   Collection Time: 12/24/16  1:39 AM  Result Value Ref Range Status   MRSA by PCR NEGATIVE NEGATIVE Final    Comment:        The GeneXpert MRSA Assay (FDA approved for NASAL specimens only), is one component of a comprehensive MRSA colonization surveillance program. It is not intended to diagnose MRSA infection nor to guide or monitor treatment for MRSA infections.   Surgical pcr screen     Status: None   Collection Time: 12/28/16  7:26 PM  Result Value Ref Range Status   MRSA, PCR NEGATIVE NEGATIVE Final   Staphylococcus aureus NEGATIVE NEGATIVE Final    Comment: (NOTE) The Xpert SA Assay (FDA approved for NASAL specimens in patients 7 years of age and older), is one component of a comprehensive surveillance program. It is not  intended to diagnose infection nor to guide or monitor treatment.     Coagulation Studies: Recent Labs    01/09/17 0400 01/10/17 0751 01/11/17 0347  LABPROT 23.6* 23.5* 22.3*  INR 2.12 2.11 1.98    Urinalysis: No results for input(s): COLORURINE, LABSPEC, PHURINE, GLUCOSEU, HGBUR, BILIRUBINUR, KETONESUR, PROTEINUR, UROBILINOGEN, NITRITE, LEUKOCYTESUR in the last 72 hours.  Invalid input(s): APPERANCEUR    Imaging: No results found.   Medications:   . sodium chloride    . sodium chloride 10 mL/hr at 01/09/17 0418  . sodium chloride    . sodium chloride    . heparin 1,000 Units/hr (01/11/17 1043)   . ALPRAZolam  0.5 mg Oral QHS  . amiodarone  200  mg Oral Daily  . calcitRIOL  0.5 mcg Oral Daily  . cyclobenzaprine  5 mg Oral QHS  . darbepoetin (ARANESP) injection - NON-DIALYSIS  150 mcg Subcutaneous Q Mon-1800  . fluticasone  1 spray Each Nare Daily  . insulin aspart  0-15 Units Subcutaneous TID WC  . insulin aspart  0-5 Units Subcutaneous QHS  . insulin glargine  23 Units Subcutaneous QHS  . isosorbide mononitrate  30 mg Oral BID  . latanoprost  1 drop Both Eyes QHS  . loratadine  10 mg Oral Daily  . meclizine  12.5 mg Oral BID  . mouth rinse  15 mL Mouth Rinse BID  . metoprolol succinate  25 mg Oral QHS  . pantoprazole  40 mg Oral Daily  . rosuvastatin  40 mg Oral q1800  . senna-docusate  2 tablet Oral BID  . 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 She is taking coumadin and plavix and so these need to be discontinued prior to tunneling of catheter. I will proceed with dialysis 11/7 #4  HFrEF. Cardiology is following  Anemia. Hemoglobin stable, had 1 dose of Feraheme. Aranesp 150 per week.  Hypertension. BPs a little soft.  Afib. Rate controlled with amiodarone and beta blocker, on warfarin  DM. per primary  Patient is scheduled for dialysis        LOS: 19 [name] @TODAY @11 :42 AM

## 2017-01-12 ENCOUNTER — Encounter (HOSPITAL_COMMUNITY): Payer: Self-pay | Admitting: General Surgery

## 2017-01-12 LAB — CBC
HCT: 31.2 % — ABNORMAL LOW (ref 36.0–46.0)
HEMOGLOBIN: 9.2 g/dL — AB (ref 12.0–15.0)
MCH: 29.4 pg (ref 26.0–34.0)
MCHC: 29.5 g/dL — ABNORMAL LOW (ref 30.0–36.0)
MCV: 99.7 fL (ref 78.0–100.0)
Platelets: 160 10*3/uL (ref 150–400)
RBC: 3.13 MIL/uL — AB (ref 3.87–5.11)
RDW: 20.5 % — ABNORMAL HIGH (ref 11.5–15.5)
WBC: 3.9 10*3/uL — AB (ref 4.0–10.5)

## 2017-01-12 LAB — GLUCOSE, CAPILLARY
GLUCOSE-CAPILLARY: 181 mg/dL — AB (ref 65–99)
Glucose-Capillary: 53 mg/dL — ABNORMAL LOW (ref 65–99)
Glucose-Capillary: 90 mg/dL (ref 65–99)
Glucose-Capillary: 150 mg/dL — ABNORMAL HIGH (ref 65–99)
Glucose-Capillary: 191 mg/dL — ABNORMAL HIGH (ref 65–99)

## 2017-01-12 LAB — RENAL FUNCTION PANEL
Albumin: 2.5 g/dL — ABNORMAL LOW (ref 3.5–5.0)
Anion gap: 8 (ref 5–15)
BUN: 20 mg/dL (ref 6–20)
CO2: 28 mmol/L (ref 22–32)
Calcium: 8.6 mg/dL — ABNORMAL LOW (ref 8.9–10.3)
Chloride: 100 mmol/L — ABNORMAL LOW (ref 101–111)
Creatinine, Ser: 3.36 mg/dL — ABNORMAL HIGH (ref 0.44–1.00)
GFR calc Af Amer: 15 mL/min — ABNORMAL LOW (ref >60)
GFR calc non Af Amer: 13 mL/min — ABNORMAL LOW (ref >60)
Glucose, Bld: 111 mg/dL — ABNORMAL HIGH (ref 65–99)
Phosphorus: 2.8 mg/dL (ref 2.5–4.6)
Potassium: 4.1 mmol/L (ref 3.5–5.1)
Sodium: 136 mmol/L (ref 135–145)

## 2017-01-12 LAB — PROTIME-INR
INR: 1.54
PROTHROMBIN TIME: 18.3 s — AB (ref 11.4–15.2)

## 2017-01-12 LAB — HEPARIN LEVEL (UNFRACTIONATED): HEPARIN UNFRACTIONATED: 0.49 [IU]/mL (ref 0.30–0.70)

## 2017-01-12 MED ORDER — INSULIN GLARGINE 100 UNIT/ML ~~LOC~~ SOLN
15.0000 [IU] | Freq: Every day | SUBCUTANEOUS | Status: DC
  Administered 2017-01-12 – 2017-01-16 (×5): 15 [IU] via SUBCUTANEOUS
  Filled 2017-01-12 (×6): qty 0.15

## 2017-01-12 MED ORDER — INSULIN GLARGINE 100 UNIT/ML ~~LOC~~ SOLN: 18.0000 [IU] | Freq: Every day | SUBCUTANEOUS | Status: DC

## 2017-01-12 NOTE — Progress Notes (Signed)
[facility] PROGRESS NOTE    Progress Note  [name]  YYT:[id number] DOB: 08-03-1945 DOA: 12/23/2016 PCP: [name], MD     Brief Narrative:   [name] is an 71 y.o. female with medical history significant for insulin-dependent diabetes mellitus, chronic kidney disease stage IV, chronic combined systolic/diastolic CHF, chronic atrial fibrillation, and coronary artery disease, presented to the ED at the direction of her nephrologist for evaluation of swelling and fatigue. She has been followed by cardiology and nephrology for worsening fluid status complicating worsening kidney function. She continued to be volume overloaded despite increase in her diuretics recently. Admitted with acute on chronic diastolic heart failure secondary to Acute on chronic renal failure.  Nephrology and cardiology following.  Unable to manage volume status despite aggressive IV diuresis.  Now deemed ESRD.  Temporary right IJ HD catheter placed and HD started on 01/07/17.     Assessment/Plan:   Principal Problem:   CKD (chronic kidney disease), stage IV (HCC) Active Problems:   Essential hypertension   Coronary artery disease   Acute on chronic combined systolic and diastolic CHF (congestive heart failure) (HCC)   Elevated troponin   DM type 2 causing vascular disease (HCC)   Pleural effusion on right   Anemia in chronic kidney disease   Atrial fibrillation (HCC)   Hypokalemia   Hypervolemia   Rhonchi  Acute on chronic combined systolic and diastolic decompensated heart failure: - 2D echo in November 11, 2016 showed an EF of 30% with grade 2 diastolic heart failure. - On admission cardiology and nephrology were consulted, despite extensive efforts to manage his diuresis patient did not make status viable progress diuresing. - After extensive discussion with family patient agreed to proceed with hemodialysis which was started on 01/07/2017 - volume manage with HD  Acute kidney  injury superimposed on chronic renal disease stage IV leading to stage V now on hemodialysis: - Underwent AV graft placement on 12/30/2016. - Nephrology initiated dialysis on 01/07/2017, he has had 4 days of HD. - Appreciate nephrology's assistance, they will proceed with tunneled catheter before DC home. -need tunnel catheter. Hold plavix and coumadin.   Paroxysmal atrial fibrillation: - Cardiology was consulted they recommended to continue amiodarone, metoprolol, Plavix. - Coumadin was held we are to proceed with tunneled catheter. - INR 1.5---hold coumadin, started  heparin for  bridge.   Coronary artery disease: - Asymptomatic continue with  mdur metoprolol and statins. -hold Plavix., for tunnel catheter  Diabetes mellitus type 2: - Uncontrolled with mild fluctuations continue current regimen, long-acting insulin plus sliding scale. -hypoglycemia-- will decrease lantus to 15 units   Hypokalemia: - To be repleated during HD.  Anemia chronic disease: - Seems to be stable continue iron supplementation.  Leukopenia: - Of unclear etiology follow-up with outpatient PCP.  Disposition: Patient was evaluated by physical therapy and occupational therapy they recommended skilled nursing facility, the patient cannot be discharged to skilled nursing facility once tunneled catheter has been placed and used.  DVT prophylaxis: coumadin Family Communication:None Disposition Plan/Barrier to D/C: home in 3-4 days Code Status:     Code Status Orders  (From admission, onward)        Start     Ordered   12/23/16 2108  Full code  Continuous     12/23/16 2119    Code Status History    Date Active Date Inactive Code Status Order ID Comments User Context   12/03/2016 11:29 12/12/2016 21:34 Full Code [id number]  Black,  Lezlie Octave, NP Inpatient   11/21/2016 13:06 11/27/2016 22:43 Full Code [id number]  Annita Brod, MD Inpatient   11/10/2016 23:59 11/16/2016 20:25 Full Code [id number]  Phillips Grout, MD Inpatient   10/31/2016 17:15 11/01/2016 17:52 Full Code [id number]  Erline Hau, MD Inpatient   08/07/2016 15:05 08/08/2016 20:44 Full Code [id number]  Oswald Hillock, MD Inpatient   07/14/2016 09:55 07/16/2016 19:09 Full Code [id number]  Albertine Othello, MD Inpatient   07/09/2016 08:54 07/12/2016 19:16 Full Code [id number]  Oswald Hillock, MD Inpatient   12/01/2015 01:19 12/03/2015 19:12 Full Code [id number]  Larey Dresser, MD Inpatient   10/10/2015 06:52 10/14/2015 18:57 Full Code [id number]  Phillips Grout, MD Inpatient   07/06/2015 16:27 07/12/2015 22:41 Full Code [id number]  Lavina Hamman, MD Inpatient        IV Access:    Peripheral IV   Procedures and diagnostic studies:   No results found.   Medical Consultants:    None.  Anti-Infectives:  None  Subjective:    She is feeling better. She was able to ambulate on the hall today with PT>    Objective:    Vitals:   01/11/17 1734 01/11/17 1937 01/11/17 2137 01/12/17 0448  BP: (!) 113/52 (!) 118/46 115/64 (!) 106/47  Pulse: 63 67  (!) 59  Resp: (!) 22 20  18   Temp: 98 F (36.7 C) 98.5 F (36.9 C)  99.3 F (37.4 C)  TempSrc: Oral Oral  Oral  SpO2: 100% 100%  94%  Weight: 86.6 kg (190 lb 14.7 oz)   86.5 kg (190 lb 12.8 oz)  Height:        Intake/Output Summary (Last 24 hours) at 01/12/2017 1345 Last data filed at 01/12/2017 1024 Gross per 24 hour  Intake 693.83 ml  Output 2000 ml  Net -1306.17 ml   Filed Weights   01/11/17 1345 01/11/17 1734 01/12/17 0448  Weight: 88.8 kg (195 lb 12.3 oz) 86.6 kg (190 lb 14.7 oz) 86.5 kg (190 lb 12.8 oz)    Exam: General exam; NAD Respiratory system: CTA Cardiovascular system: S 1, S 2 RRR Gastrointestinal system: BS present, soft, nt Central nervous system: non focal.   Extremities: no edema Skin: No rashes, lesions or ulcers    Data Reviewed:    Labs: Basic Metabolic Panel: Recent Labs  Lab 01/08/17 0519 01/09/17 0400 01/10/17 0751 01/11/17 0347  01/12/17 0503  NA 137 136 136 136 136  K 3.2* 3.5 3.8 3.8 4.1  CL 102 99* 99* 101 100*  CO2 26 28 29 27 28   GLUCOSE 255* 139* 90 125* 111*  BUN 51* 35* 23* 33* 20  CREATININE 2.82* 3.10* 3.16* 4.03* 3.36*  CALCIUM 7.6* 8.3* 8.4* 8.7* 8.6*  PHOS 3.1 3.2 3.2 4.2 2.8   GFR Estimated Creatinine Clearance: 17.4 mL/min (A) (by C-G formula based on SCr of 3.36 mg/dL (H)). Liver Function Tests: Recent Labs  Lab 01/08/17 0519 01/09/17 0400 01/10/17 0751 01/11/17 0347 01/12/17 0503  ALBUMIN 2.2* 2.6* 2.5* 2.4* 2.5*   No results for input(s): LIPASE, AMYLASE in the last 168 hours. No results for input(s): AMMONIA in the last 168 hours. Coagulation profile Recent Labs  Lab 01/08/17 0519 01/09/17 0400 01/10/17 0751 01/11/17 0347 01/12/17 0503  INR 3.17 2.12 2.11 1.98 1.54    CBC: Recent Labs  Lab 01/07/17 0649 01/08/17 0519 01/09/17 0400 01/11/17 1647 01/12/17 0503  WBC 3.8* 3.2* 3.5* 3.5* 3.9*  HGB 8.7* 8.8*  8.7* 9.3* 9.2*  HCT 29.6* 29.1* 29.6* 31.5* 31.2*  MCV 97.4 98.6 98.3 98.1 99.7  PLT 195 186 175 131* 160   Cardiac Enzymes: No results for input(s): CKTOTAL, CKMB, CKMBINDEX, TROPONINI in the last 168 hours. BNP (last 3 results) No results for input(s): PROBNP in the last 8760 hours. CBG: Recent Labs  Lab 01/11/17 1816 01/11/17 2125 01/12/17 0749 01/12/17 0832 01/12/17 1135  GLUCAP 96 150* 53* 90 150*   D-Dimer: No results for input(s): DDIMER in the last 72 hours. Hgb A1c: No results for input(s): HGBA1C in the last 72 hours. Lipid Profile: No results for input(s): CHOL, HDL, LDLCALC, TRIG, CHOLHDL, LDLDIRECT in the last 72 hours. Thyroid function studies: No results for input(s): TSH, T4TOTAL, T3FREE, THYROIDAB in the last 72 hours.  Invalid input(s): FREET3 Anemia work up: No results for input(s): VITAMINB12, FOLATE, FERRITIN, TIBC, IRON, RETICCTPCT in the last 72 hours. Sepsis Labs: Recent Labs  Lab 01/08/17 0519 01/09/17 0400  01/11/17 1647 01/12/17 0503  WBC 3.2* 3.5* 3.5* 3.9*   Microbiology No results found for this or any previous visit (from the past 240 hour(s)).   Medications:   . ALPRAZolam  0.5 mg Oral QHS  . amiodarone  200 mg Oral Daily  . calcitRIOL  0.5 mcg Oral Daily  . cyclobenzaprine  5 mg Oral QHS  . darbepoetin (ARANESP) injection - NON-DIALYSIS  150 mcg Subcutaneous Q Mon-1800  . fluticasone  1 spray Each Nare Daily  . insulin aspart  0-15 Units Subcutaneous TID WC  . insulin aspart  0-5 Units Subcutaneous QHS  . insulin glargine  23 Units Subcutaneous QHS  . isosorbide mononitrate  30 mg Oral BID  . latanoprost  1 drop Both Eyes QHS  . loratadine  10 mg Oral Daily  . meclizine  12.5 mg Oral BID  . mouth rinse  15 mL Mouth Rinse BID  . metoprolol succinate  25 mg Oral QHS  . pantoprazole  40 mg Oral Daily  . rosuvastatin  40 mg Oral q1800  . senna-docusate  2 tablet Oral BID  . sodium chloride flush  3 mL Intravenous Q12H   Continuous Infusions: . sodium chloride    . sodium chloride 10 mL/hr at 01/09/17 0418  . sodium chloride    . sodium chloride    . heparin 1,000 Units/hr (01/12/17 0956)      LOS: 20 days   Niel Hummer A  [facility] Pager [phone]    01/12/2017, 1:45 PM

## 2017-01-12 NOTE — Progress Notes (Signed)
Occupational Therapy Treatment Patient Details Name: [name] MRN: [id number] DOB: 06/01/1945 Today's Date: 01/12/2017    History of present illness Pt is a 71 y.o. female PMH of CAD (s/p NSTEMI in 06/2015, subtotal occlusion of LCX treated medically; repeat cath 11/2015 with stable disease), chronic combined diastolic CHF (EF 02-54% by echo in 11/2016), paroxysmal a-fib, R subclavian stenosis (s/p intervention), IDDM, HTN, HLD, and CKD IV admitted on 12/23/2016 for worsening edema and fatigue. Pt being treated for acute on chronic combined systolic and diastolic CHF, acute on chronic Stage 4 CKD, paroxysmal A-fib, CAD, anemia, hypoprothrobinemia, and IDDM. Now s/p temporary R IJ HD cath placement 11/3. Tentative plan for tunnelled R IJ cath placement on Monday 11/12.    OT comments  Pt with improved activity tolerance for mobility, standing grooming and toileting. Chronic back pain interfering with ability to stand and walk for longer periods. Continues to be appropriate for post acute therapy in SNF.  Follow Up Recommendations  SNF;Supervision/Assistance - 24 hour    Equipment Recommendations       Recommendations for Other Services      Precautions / Restrictions Precautions Precautions: Fall Restrictions Weight Bearing Restrictions: No       Mobility Bed Mobility               General bed mobility comments: Received sitting in chair  Transfers Overall transfer level: Needs assistance Equipment used: Rolling walker (2 wheeled) Transfers: Sit to/from Stand Sit to Stand: Min guard         General transfer comment: Stood x3 from chair and toilet with min guard for balance; pt able to perform self-pericare standing at toilet with UE support and min guard. Cues for safety with RW; lacks eccentric control with fatigue when sitting    Balance Overall balance assessment: Needs assistance Sitting-balance support: No upper extremity supported;Feet supported Sitting  balance-[name]: Good     Standing balance support: Bilateral upper extremity supported;Single extremity supported Standing balance-[name]: Poor Standing balance comment: Reliant on UE support                           ADL either performed or assessed with clinical judgement   ADL Overall ADL's : Needs assistance/impaired     Grooming: Wash/dry hands;Standing;Min guard Grooming Details (indicate cue type and reason): leans on elbows due to back pain         Upper Body Dressing : Minimal assistance;Sitting       Toilet Transfer: Minimal assistance;Ambulation;BSC;RW   Toileting- Clothing Manipulation and Hygiene: Minimal assistance;Sit to/from stand       Functional mobility during ADLs: Minimal assistance;Rolling walker General ADL Comments: Pt much more alert and motivated to work with therapy.     Vision       Perception     Praxis      Cognition Arousal/Alertness: Awake/alert Behavior During Therapy: WFL for tasks assessed/performed Overall Cognitive Status: Within Functional Limits for tasks assessed                                          Exercises     Shoulder Instructions       General Comments      Pertinent Vitals/ Pain       Pain Assessment: Faces Faces Pain Scale: Hurts little more Pain Location: Chronic back pain  Pain Descriptors / Indicators: Sore Pain Intervention(s): Repositioned;Monitored during session;Limited activity within patient's tolerance  Home Living                                          Prior Functioning/Environment              Frequency  Min 2X/week        Progress Toward Goals  OT Goals(current goals can now be found in the care plan section)  Progress towards OT goals: Progressing toward goals  Acute Rehab OT Goals Patient Stated Goal: Return home OT Goal Formulation: With patient Time For Goal Achievement: 01/23/17 Potential to Achieve Goals:  Good  Plan Discharge plan remains appropriate    Co-evaluation      Reason for Co-Treatment: For patient/therapist safety;To address functional/ADL transfers PT goals addressed during session: Mobility/safety with mobility;Proper use of DME        AM-PAC PT "6 Clicks" Daily Activity     Outcome Measure   Help from another person eating meals?: None Help from another person taking care of personal grooming?: A Little Help from another person toileting, which includes using toliet, bedpan, or urinal?: A Little Help from another person bathing (including washing, rinsing, drying)?: A Lot Help from another person to put on and taking off regular upper body clothing?: A Little Help from another person to put on and taking off regular lower body clothing?: A Lot 6 Click Score: 17    End of Session Equipment Utilized During Treatment: Gait belt;Rolling walker  OT Visit Diagnosis: Unsteadiness on feet (R26.81);Muscle weakness (generalized) (M62.81);Pain   Activity Tolerance Patient tolerated treatment well   Patient Left in chair;with call bell/phone within reach;with chair alarm set   Nurse Communication          Time: [phone] OT Time Calculation (min): 35 min  Charges: OT General Charges $OT Visit: 1 Visit OT Evaluation $OT Eval Moderate Complexity: 1 Mod  01/12/2017 Nestor Lewandowsky, OTR/L Pager: [phone]  Werner Lean, Haze Boyden 01/12/2017, 11:47 AM

## 2017-01-12 NOTE — Progress Notes (Signed)
[facility] ROUNDING NOTE   Subjective:   Interval History sitting on edge of bed no complaints today   Objective:  Vital signs in last 24 hours:  Temp:  [98 F (36.7 C)-99.3 F (37.4 C)] 99.3 F (37.4 C) (11/08 0448) Pulse Rate:  [59-67] 59 (11/08 0448) Resp:  [18-22] 18 (11/08 0448) BP: (101-118)/(40-64) 106/47 (11/08 0448) SpO2:  [94 %-100 %] 94 % (11/08 0448) Weight:  [190 lb 12.8 oz (86.5 kg)-195 lb 12.3 oz (88.8 kg)] 190 lb 12.8 oz (86.5 kg) (11/08 0448)  Weight change: 3 lb 7.5 oz (1.573 kg) Filed Weights   01/11/17 1345 01/11/17 1734 01/12/17 0448  Weight: 195 lb 12.3 oz (88.8 kg) 190 lb 14.7 oz (86.6 kg) 190 lb 12.8 oz (86.5 kg)    Intake/Output: I/O last 3 completed shifts: In: 693.8 [P.O.:538; I.V.:155.8] Out: 2000 [Other:2000]   Intake/Output this shift:  No intake/output data recorded.  Gen: NAD HEENT: + JVD. R IJ nontunneled HD catheter in place CVS: Regular rate and rhythm  Resp: bilaterally decreased breath sounds at bases,with few basal crackles. Abd: soft, non tender, bowel sounds positive. Ext: 1 +lower extremity edema,slightly improved,left AVG with good bruit.     Basic Metabolic Panel: Recent Labs  Lab 01/08/17 0519 01/09/17 0400 01/10/17 0751 01/11/17 0347 01/12/17 0503  NA 137 136 136 136 136  K 3.2* 3.5 3.8 3.8 4.1  CL 102 99* 99* 101 100*  CO2 26 28 29 27 28   GLUCOSE 255* 139* 90 125* 111*  BUN 51* 35* 23* 33* 20  CREATININE 2.82* 3.10* 3.16* 4.03* 3.36*  CALCIUM 7.6* 8.3* 8.4* 8.7* 8.6*  PHOS 3.1 3.2 3.2 4.2 2.8    Liver Function Tests: Recent Labs  Lab 01/08/17 0519 01/09/17 0400 01/10/17 0751 01/11/17 0347 01/12/17 0503  ALBUMIN 2.2* 2.6* 2.5* 2.4* 2.5*   No results for input(s): LIPASE, AMYLASE in the last 168 hours. No results for input(s): AMMONIA in the last 168 hours.  CBC: Recent Labs  Lab 01/07/17 0649 01/08/17 0519 01/09/17 0400 01/11/17 1647 01/12/17 0503  WBC 3.8* 3.2* 3.5*  3.5* 3.9*  HGB 8.7* 8.8* 8.7* 9.3* 9.2*  HCT 29.6* 29.1* 29.6* 31.5* 31.2*  MCV 97.4 98.6 98.3 98.1 99.7  PLT 195 186 175 131* 160    Cardiac Enzymes: No results for input(s): CKTOTAL, CKMB, CKMBINDEX, TROPONINI in the last 168 hours.  BNP: Invalid input(s): POCBNP  CBG: Recent Labs  Lab 01/10/17 2054 01/11/17 0743 01/11/17 1113 01/11/17 1816 01/11/17 2125  GLUCAP 181* 72 117* 66 150*    Microbiology: Results for orders placed or performed during the hospital encounter of 12/23/16  MRSA PCR Screening     Status: None   Collection Time: 12/24/16  1:39 AM  Result Value Ref Range Status   MRSA by PCR NEGATIVE NEGATIVE Final    Comment:        The GeneXpert MRSA Assay (FDA approved for NASAL specimens only), is one component of a comprehensive MRSA colonization surveillance program. It is not intended to diagnose MRSA infection nor to guide or monitor treatment for MRSA infections.   Surgical pcr screen     Status: None   Collection Time: 12/28/16  7:26 PM  Result Value Ref Range Status   MRSA, PCR NEGATIVE NEGATIVE Final   Staphylococcus aureus NEGATIVE NEGATIVE Final    Comment: (NOTE) The Xpert SA Assay (FDA approved for NASAL specimens in patients 43 years of age and older), is one component of a comprehensive  surveillance program. It is not intended to diagnose infection nor to guide or monitor treatment.     Coagulation Studies: Recent Labs    01/10/17 0751 01/11/17 0347 01/12/17 0503  LABPROT 23.5* 22.3* 18.3*  INR 2.11 1.98 1.54    Urinalysis: No results for input(s): COLORURINE, LABSPEC, PHURINE, GLUCOSEU, HGBUR, BILIRUBINUR, KETONESUR, PROTEINUR, UROBILINOGEN, NITRITE, LEUKOCYTESUR in the last 72 hours.  Invalid input(s): APPERANCEUR    Imaging: No results found.   Medications:   . sodium chloride    . sodium chloride 10 mL/hr at 01/09/17 0418  . sodium chloride    . sodium chloride    . heparin 1,000 Units/hr (01/11/17 1043)   .  ALPRAZolam  0.5 mg Oral QHS  . amiodarone  200 mg Oral Daily  . calcitRIOL  0.5 mcg Oral Daily  . cyclobenzaprine  5 mg Oral QHS  . darbepoetin (ARANESP) injection - NON-DIALYSIS  150 mcg Subcutaneous Q Mon-1800  . fluticasone  1 spray Each Nare Daily  . insulin aspart  0-15 Units Subcutaneous TID WC  . insulin aspart  0-5 Units Subcutaneous QHS  . insulin glargine  23 Units Subcutaneous QHS  . isosorbide mononitrate  30 mg Oral BID  . latanoprost  1 drop Both Eyes QHS  . loratadine  10 mg Oral Daily  . meclizine  12.5 mg Oral BID  . mouth rinse  15 mL Mouth Rinse BID  . metoprolol succinate  25 mg Oral QHS  . pantoprazole  40 mg Oral Daily  . rosuvastatin  40 mg Oral q1800  . senna-docusate  2 tablet Oral BID  . 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 She is taking coumadin and plavix and so these need to be discontinued prior to tunneling of catheter. I will proceed with dialysis 11/7 #4  HFrEF. Cardiology is following  Anemia. Hemoglobin stable, had 1 dose of Feraheme. Aranesp 150 per week.  Hypertension. BPs a little soft.  Afib. Rate controlled with amiodarone and beta blocker, on warfarin  DM. per primary  Dialysis scheduled for tomorrow  She appears to be clipped for [place]     LOS: 20 Trestan Vahle W @TODAY @7 :25 AM

## 2017-01-12 NOTE — Consult Note (Signed)
Chief Complaint: renal failure  Referring Physician: Dr. Madelon Lips  Supervising Physician: Corrie Mckusick  Patient Status: [facility] - In-pt  HPI: [name] is a 71 y.o. female who was admitted secondary to swelling and fatigue at the request of her nephrologist.  She has a history of Stage IV CKD.  She was admitted as she was felt to now be ESRD and needed to start dialysis.  She had a temp cath placed so HD could be initiated.  We have been asked to see her now for conversion of her temp cath to a perm cath.  She has been on coumadin as well as plavix.  These have been held for her procedure.  Past Medical History:  Past Medical History:  Diagnosis Date  . Acute renal failure superimposed on stage 4 chronic kidney disease ([place]) 07/04/2015  . Allergic rhinitis   . Anemia   . Anxiety   . CAD in native artery    NSTEMI 07/2015 s/p DES to RCA and posterior PDA, PCI 10/2015 with scoring balloon to 85% ISR of distal RCA), known LAD/Cx disease treated medically  . Carotid artery disease ([place])    Mild bilateral carotid disease (1-39% 07/2015)  . Chronic combined systolic and diastolic CHF (congestive heart failure) ([place])   . CKD (chronic kidney disease), stage IV ([place])   . Constipation   . Essential hypertension   . GERD (gastroesophageal reflux disease)   . History of hysterectomy   . Hyperlipidemia   . Low back pain   . Obesity   . 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 Osteoarthritis   . Overactive bladder   . PVC's (premature ventricular contractions)   . Type 2 diabetes mellitus ([place])     Past Surgical History:  Past Surgical History:  Procedure Laterality Date  . ABDOMINAL HYSTERECTOMY    . BACK SURGERY     multiple  . CARDIAC CATHETERIZATION  04/04/2006   Est EF of 60%  . CARPAL TUNNEL RELEASE    . CERVICAL BIOPSY     cervical lymph node biopsies  . COLONOSCOPY  May 2002   Dr. Irving Shows :Followup in 5 years, normal exam  . COLONOSCOPY  2008   Dr. Laural Golden: Very redundant colon with mild melanosis coli, splenic flexure polyp biopsy with acute complaint of benign colon polyp. Recommended ten-year followup  . CORONARY STENT PLACEMENT  04/11/2006   2 -- Taxus stents to the circumflex   . IR FLUORO GUIDE CV LINE RIGHT  01/07/2017  . IR US GUIDE VASC ACCESS RIGHT  01/07/2017  . Tendonitis     bilateral elbow  . TRIGGER FINGER RELEASE      Family History:  Family History  Problem Relation Age of Onset  . Diabetes Father   . Hypertension Father   . Stroke Father   . Hypertension Brother   . Aneurysm Brother   . Diabetes Brother   . Colon cancer Neg Hx     Social History:  reports that  has never smoked. she has never used smokeless tobacco. She reports that she does not drink alcohol or use drugs.  Allergies:  Allergies  Allergen Reactions  . Ace Inhibitors Cough  . Amlodipine Swelling    UNSPECIFIED EDEMA   . Codeine Rash    Medications: Medications reviewed in epic  Please HPI for pertinent positives, otherwise complete 10 system ROS negative.  Mallampati Score:  MD Evaluation Airway: WNL Heart: Other (comments) Heart  comments: +murmur Abdomen: WNL Chest/ Lungs: WNL ASA  Classification: 3 Mallampati/Airway Score: Two  Physical Exam: BP (!) 106/47 (BP Location: Right Arm)   Pulse (!) 59   Temp 99.3 F (37.4 C) (Oral)   Resp 18   Ht '5\' 8"'$  (1.727 m)   Wt 190 lb 12.8 oz (86.5 kg) Comment: scale c  SpO2 94%   BMI 29.01 kg/m  Body mass index is 29.01 kg/m. General: pleasant, WD, WN black female who is sitting in her chair in NAD HEENT: head is normocephalic, atraumatic.  Sclera are noninjected.  PERRL.  Ears and nose without any masses or lesions.  Mouth is pink and moist.  Temp cath in R IJ Heart: regular, rate, and rhythm.  Normal s1,s2.  +murmur No obvious gallops, or rubs noted.  Palpable radial pulses bilaterally Lungs: CTAB, no wheezes,  rhonchi, or rales noted.  Respiratory effort nonlabored Abd: soft, NT, ND, +BS, no masses, hernias, or organomegaly Psych: A&Ox3 with an appropriate affect.   Labs: Results for orders placed or performed during the hospital encounter of 12/23/16 (from the past 48 hour(s))  Glucose, capillary     Status: Abnormal   Collection Time: 01/10/17  4:56 PM  Result Value Ref Range   Glucose-Capillary 122 (H) 65 - 99 mg/dL   Comment 1 Notify RN   Glucose, capillary     Status: Abnormal   Collection Time: 01/10/17  8:54 PM  Result Value Ref Range   Glucose-Capillary 181 (H) 65 - 99 mg/dL   Comment 1 Notify RN    Comment 2 Document in Chart   Protime-INR     Status: Abnormal   Collection Time: 01/11/17  3:47 AM  Result Value Ref Range   Prothrombin Time 22.3 (H) 11.4 - 15.2 seconds   INR 1.98   Renal function panel     Status: Abnormal   Collection Time: 01/11/17  3:47 AM  Result Value Ref Range   Sodium 136 135 - 145 mmol/L   Potassium 3.8 3.5 - 5.1 mmol/L   Chloride 101 101 - 111 mmol/L   CO2 27 22 - 32 mmol/L   Glucose, Bld 125 (H) 65 - 99 mg/dL   BUN 33 (H) 6 - 20 mg/dL   Creatinine, Ser 4.03 (H) 0.44 - 1.00 mg/dL   Calcium 8.7 (L) 8.9 - 10.3 mg/dL   Phosphorus 4.2 2.5 - 4.6 mg/dL   Albumin 2.4 (L) 3.5 - 5.0 g/dL   GFR calc non Af Amer 10 (L) >60 mL/min   GFR calc Af Amer 12 (L) >60 mL/min    Comment: (NOTE) The eGFR has been calculated using the CKD EPI equation. This calculation has not been validated in all clinical situations. eGFR's persistently <60 mL/min signify possible Chronic Kidney Disease.    Anion gap 8 5 - 15  Glucose, capillary     Status: None   Collection Time: 01/11/17  7:43 AM  Result Value Ref Range   Glucose-Capillary 72 65 - 99 mg/dL   Comment 1 Notify RN   Glucose, capillary     Status: Abnormal   Collection Time: 01/11/17 11:13 AM  Result Value Ref Range   Glucose-Capillary 117 (H) 65 - 99 mg/dL   Comment 1 Notify RN   CBC     Status: Abnormal    Collection Time: 01/11/17  4:47 PM  Result Value Ref Range   WBC 3.5 (L) 4.0 - 10.5 K/uL  RBC 3.21 (L) 3.87 - 5.11 MIL/uL   Hemoglobin 9.3 (L) 12.0 - 15.0 g/dL   HCT 31.5 (L) 36.0 - 46.0 %   MCV 98.1 78.0 - 100.0 fL   MCH 29.0 26.0 - 34.0 pg   MCHC 29.5 (L) 30.0 - 36.0 g/dL   RDW 20.2 (H) 11.5 - 15.5 %   Platelets 131 (L) 150 - 400 K/uL  Glucose, capillary     Status: None   Collection Time: 01/11/17  6:16 PM  Result Value Ref Range   Glucose-Capillary 96 65 - 99 mg/dL   Comment 1 Notify RN   Glucose, capillary     Status: Abnormal   Collection Time: 01/11/17  9:25 PM  Result Value Ref Range   Glucose-Capillary 150 (H) 65 - 99 mg/dL   Comment 1 Notify RN    Comment 2 Document in Chart   Heparin level (unfractionated)     Status: None   Collection Time: 01/11/17  9:30 PM  Result Value Ref Range   Heparin Unfractionated 0.47 0.30 - 0.70 IU/mL    Comment:        IF HEPARIN RESULTS ARE BELOW EXPECTED VALUES, AND PATIENT DOSAGE HAS BEEN CONFIRMED, SUGGEST FOLLOW UP TESTING OF ANTITHROMBIN III LEVELS.   Heparin level (unfractionated)     Status: None   Collection Time: 01/12/17  5:03 AM  Result Value Ref Range   Heparin Unfractionated 0.49 0.30 - 0.70 IU/mL    Comment:        IF HEPARIN RESULTS ARE BELOW EXPECTED VALUES, AND PATIENT DOSAGE HAS BEEN CONFIRMED, SUGGEST FOLLOW UP TESTING OF ANTITHROMBIN III LEVELS.   CBC     Status: Abnormal   Collection Time: 01/12/17  5:03 AM  Result Value Ref Range   WBC 3.9 (L) 4.0 - 10.5 K/uL   RBC 3.13 (L) 3.87 - 5.11 MIL/uL   Hemoglobin 9.2 (L) 12.0 - 15.0 g/dL   HCT 31.2 (L) 36.0 - 46.0 %   MCV 99.7 78.0 - 100.0 fL   MCH 29.4 26.0 - 34.0 pg   MCHC 29.5 (L) 30.0 - 36.0 g/dL   RDW 20.5 (H) 11.5 - 15.5 %   Platelets 160 150 - 400 K/uL  Protime-INR     Status: Abnormal   Collection Time: 01/12/17  5:03 AM  Result Value Ref Range   Prothrombin Time 18.3 (H) 11.4 - 15.2 seconds   INR 1.54   Renal function panel     Status:  Abnormal   Collection Time: 01/12/17  5:03 AM  Result Value Ref Range   Sodium 136 135 - 145 mmol/L   Potassium 4.1 3.5 - 5.1 mmol/L   Chloride 100 (L) 101 - 111 mmol/L   CO2 28 22 - 32 mmol/L   Glucose, Bld 111 (H) 65 - 99 mg/dL   BUN 20 6 - 20 mg/dL   Creatinine, Ser 3.36 (H) 0.44 - 1.00 mg/dL   Calcium 8.6 (L) 8.9 - 10.3 mg/dL   Phosphorus 2.8 2.5 - 4.6 mg/dL   Albumin 2.5 (L) 3.5 - 5.0 g/dL   GFR calc non Af Amer 13 (L) >60 mL/min   GFR calc Af Amer 15 (L) >60 mL/min    Comment: (NOTE) The eGFR has been calculated using the CKD EPI equation. This calculation has not been validated in all clinical situations. eGFR's persistently <60 mL/min signify possible Chronic Kidney Disease.    Anion gap 8 5 - 15  Glucose, capillary     Status: Abnormal  Collection Time: 01/12/17  7:49 AM  Result Value Ref Range   Glucose-Capillary 53 (L) 65 - 99 mg/dL   Comment 1 Notify RN   Glucose, capillary     Status: None   Collection Time: 01/12/17  8:32 AM  Result Value Ref Range   Glucose-Capillary 90 65 - 99 mg/dL   Comment 1 Notify RN   Glucose, capillary     Status: Abnormal   Collection Time: 01/12/17 11:35 AM  Result Value Ref Range   Glucose-Capillary 150 (H) 65 - 99 mg/dL   Comment 1 Notify RN     Imaging: No results found.  Assessment/Plan 1. ESRD  We will plan to convert her temp cath to a perm cath on Monday.  Now that her coumadin is held, her INR is 1.54.  Her last day of plavix was Tuesday, so our first day after 5 days off plavix will be Monday.  She will be NPO p MN with plans for this procedure. Risks and benefits discussed with the patient including, but not limited to bleeding, infection, vascular injury, pneumothorax which may require chest tube placement, air embolism or even death All of the patient's questions were answered, patient is agreeable to proceed. Consent signed and in chart.  Thank you for this interesting consult.  I greatly enjoyed meeting ADONICA FUKUSHIMA and look forward to participating in their care.  A copy of this report was sent to the requesting provider on this date.  Electronically Signed: Henreitta Cea 01/12/2017, 1:03 PM   I spent a total of 40 Minutes    in face to face in clinical consultation, greater than 50% of which was counseling/coordinating care for ESRD

## 2017-01-12 NOTE — Progress Notes (Signed)
Physical Therapy Treatment Patient Details Name: [name] MRN: [id number] DOB: 10-Dec-1945 Today's Date: 01/12/2017    History of Present Illness Pt is a 71 y.o. female PMH of CAD (s/p NSTEMI in 06/2015, subtotal occlusion of LCX treated medically; repeat cath 11/2015 with stable disease), chronic combined diastolic CHF (EF 33-00% by echo in 11/2016), paroxysmal a-fib, R subclavian stenosis (s/p intervention), IDDM, HTN, HLD, and CKD IV admitted on 12/23/2016 for worsening edema and fatigue. Pt being treated for acute on chronic combined systolic and diastolic CHF, acute on chronic Stage 4 CKD, paroxysmal A-fib, CAD, anemia, hypoprothrobinemia, and IDDM. Now s/p temporary R IJ HD cath placement 11/3. Tentative plan for tunnelled R IJ cath placement on Monday 11/12.    PT Comments    Pt progressing with mobility and motivated to work with therapies this session. Required min guard for transfers and amb(+2 for chair follow) with RW; remains at increased fall risk secondary to slowed gait speed, BLE weakness, and decreased activity tolerance; requiring 3x seated rest breaks secondary to fatigue and c/o chronic LBP. Still feel SNF at d/c is appropriate, and pt in agreement with this. Will continue to follow acutely.    Follow Up Recommendations  SNF;Supervision for mobility/OOB     Equipment Recommendations  None recommended by PT    Recommendations for Other Services       Precautions / Restrictions Precautions Precautions: Fall Restrictions Weight Bearing Restrictions: No    Mobility  Bed Mobility               General bed mobility comments: Received sitting in chair  Transfers Overall transfer level: Needs assistance Equipment used: Rolling walker (2 wheeled) Transfers: Sit to/from Stand Sit to Stand: Min guard         General transfer comment: Stood x3 from chair and toilet with min guard for balance; pt able to perform self-pericare standing at toilet with UE  support and min guard. Cues for safety with RW; lacks eccentric control with fatigue when sitting  Ambulation/Gait Ambulation/Gait assistance: Min guard Ambulation Distance (Feet): 15 Feet(+80) Assistive device: Rolling walker (2 wheeled) Gait Pattern/deviations: Step-through pattern;Decreased stride length;Trunk flexed Gait velocity: decreased Gait velocity interpretation: <1.8 ft/sec, indicative of risk for recurrent falls General Gait Details: Slow, controlled amb with RW and min guard for safety (+2 with chair follow); pt requiring 3x seated rest break secondary to fatigue and c/o chronic LBP.    Stairs            Wheelchair Mobility    Modified Rankin (Stroke Patients Only)       Balance Overall balance assessment: Needs assistance Sitting-balance support: No upper extremity supported;Feet supported Sitting balance-[name]: Good     Standing balance support: Bilateral upper extremity supported;Single extremity supported Standing balance-[name]: Poor Standing balance comment: Reliant on UE support                            Cognition Arousal/Alertness: Awake/alert Behavior During Therapy: WFL for tasks assessed/performed Overall Cognitive Status: Within Functional Limits for tasks assessed                                        Exercises      General Comments        Pertinent Vitals/Pain Pain Assessment: Faces Faces Pain Scale: Hurts little more Pain Location:  Chronic back pain Pain Descriptors / Indicators: Sore Pain Intervention(s): Monitored during session;Limited activity within patient's tolerance    Home Living                      Prior Function            PT Goals (current goals can now be found in the care plan section) Acute Rehab PT Goals Patient Stated Goal: Return home PT Goal Formulation: With patient Time For Goal Achievement: 01/26/17 Potential to Achieve Goals: Good Progress towards  PT goals: Progressing toward goals    Frequency    Min 3X/week      PT Plan Current plan remains appropriate    Co-evaluation PT/OT/SLP Co-Evaluation/Treatment: Yes Reason for Co-Treatment: For patient/therapist safety;To address functional/ADL transfers PT goals addressed during session: Mobility/safety with mobility;Proper use of DME        AM-PAC PT "6 Clicks" Daily Activity  Outcome Measure  Difficulty turning over in bed (including adjusting bedclothes, sheets and blankets)?: Unable Difficulty moving from lying on back to sitting on the side of the bed? : Unable Difficulty sitting down on and standing up from a chair with arms (e.g., wheelchair, bedside commode, etc,.)?: A Little Help needed moving to and from a bed to chair (including a wheelchair)?: A Little Help needed walking in hospital room?: A Little Help needed climbing 3-5 steps with a railing? : A Lot 6 Click Score: 13    End of Session Equipment Utilized During Treatment: Gait belt Activity Tolerance: Patient tolerated treatment well Patient left: in chair;with call bell/phone within reach Nurse Communication: Mobility status PT Visit Diagnosis: Unsteadiness on feet (R26.81);Other abnormalities of gait and mobility (R26.89);Muscle weakness (generalized) (M62.81)     Time: 1443-1540 PT Time Calculation (min) (ACUTE ONLY): 27 min  Charges:  $Gait Training: 8-22 mins                    G Codes:      Mabeline Caras, PT, DPT Acute Rehab Services  Pager: [place] 01/12/2017, 10:38 AM

## 2017-01-12 NOTE — Care Management Important Message (Signed)
Important Message  Patient Details  Name: [name] MRN: [id number] Date of Birth: 1945/11/25   Medicare Important Message Given:  Yes    Nathen May 01/12/2017, 12:47 PM

## 2017-01-12 NOTE — Progress Notes (Signed)
[place] for heparin Indication: atrial fibrillation  Allergies  Allergen Reactions  . Ace Inhibitors Cough  . Amlodipine Swelling    UNSPECIFIED EDEMA   . Codeine Rash    Patient Measurements: Height: 5\' 8"  (172.7 cm) Weight: 190 lb 12.8 oz (86.5 kg)(scale c) IBW/kg (Calculated) : 63.9  Vital Signs: Temp: 99.3 F (37.4 C) (11/08 0448) Temp Source: Oral (11/08 0448) BP: 106/47 (11/08 0448) Pulse Rate: 59 (11/08 0448)  Labs: Recent Labs    01/10/17 0751 01/11/17 0347 01/11/17 1647 01/11/17 2130 01/12/17 0503  HGB  --   --  9.3*  --  9.2*  HCT  --   --  31.5*  --  31.2*  PLT  --   --  131*  --  160  LABPROT 23.5* 22.3*  --   --  18.3*  INR 2.11 1.98  --   --  1.54  HEPARINUNFRC  --   --   --  0.47 0.49  CREATININE 3.16* 4.03*  --   --  3.36*    Assessment: On warfarin PTA for Afib - currently on hold for [facility] placement. Heparin bridge, with therapeutic heparin level this morning. INR 1.54, cbc stable.   Goal of Therapy:  Heparin level 0.3-0.7 units/ml Monitor platelets by anticoagulation protocol: Yes    Plan:  Continue heparin at 1000 units/hr Daily heparin level and CBC Monitor for s/sx of bleeding   Hughes Better, PharmD, BCPS Clinical Pharmacist 01/12/2017 8:11 AM

## 2017-01-13 ENCOUNTER — Encounter: Payer: Self-pay | Admitting: *Deleted

## 2017-01-13 ENCOUNTER — Telehealth: Payer: Self-pay | Admitting: Cardiovascular Disease

## 2017-01-13 LAB — CBC
HCT: 31.5 % — ABNORMAL LOW (ref 36.0–46.0)
HEMOGLOBIN: 9.2 g/dL — AB (ref 12.0–15.0)
MCH: 29 pg (ref 26.0–34.0)
MCHC: 29.2 g/dL — ABNORMAL LOW (ref 30.0–36.0)
MCV: 99.4 fL (ref 78.0–100.0)
Platelets: 165 10*3/uL (ref 150–400)
RBC: 3.17 MIL/uL — AB (ref 3.87–5.11)
RDW: 20.2 % — ABNORMAL HIGH (ref 11.5–15.5)
WBC: 4 10*3/uL (ref 4.0–10.5)

## 2017-01-13 LAB — RENAL FUNCTION PANEL
Albumin: 2.7 g/dL — ABNORMAL LOW (ref 3.5–5.0)
Anion gap: 10 (ref 5–15)
BUN: 31 mg/dL — AB (ref 6–20)
CHLORIDE: 98 mmol/L — AB (ref 101–111)
CO2: 26 mmol/L (ref 22–32)
CREATININE: 4.87 mg/dL — AB (ref 0.44–1.00)
Calcium: 9 mg/dL (ref 8.9–10.3)
GFR calc Af Amer: 9 mL/min — ABNORMAL LOW (ref >60)
GFR calc non Af Amer: 8 mL/min — ABNORMAL LOW (ref >60)
Glucose, Bld: 191 mg/dL — ABNORMAL HIGH (ref 65–99)
Phosphorus: 4.1 mg/dL (ref 2.5–4.6)
Potassium: 3.9 mmol/L (ref 3.5–5.1)
Sodium: 134 mmol/L — ABNORMAL LOW (ref 135–145)

## 2017-01-13 LAB — GLUCOSE, CAPILLARY
GLUCOSE-CAPILLARY: 76 mg/dL (ref 65–99)
GLUCOSE-CAPILLARY: 141 mg/dL — AB (ref 65–99)
Glucose-Capillary: 169 mg/dL — ABNORMAL HIGH (ref 65–99)

## 2017-01-13 LAB — HEPARIN LEVEL (UNFRACTIONATED): Heparin Unfractionated: 0.93 IU/mL — ABNORMAL HIGH (ref 0.30–0.70)

## 2017-01-13 LAB — PROTIME-INR
INR: 1.4
Prothrombin Time: 17.1 seconds — ABNORMAL HIGH (ref 11.4–15.2)

## 2017-01-13 MED ORDER — LIDOCAINE-PRILOCAINE 2.5-2.5 % EX CREA: 1.0000 "application " | TOPICAL_CREAM | CUTANEOUS | Status: DC | PRN

## 2017-01-13 MED ORDER — ALTEPLASE 2 MG IJ SOLR: 2.0000 mg | Freq: Once | INTRAMUSCULAR | Status: DC | PRN

## 2017-01-13 MED ORDER — HEPARIN SODIUM (PORCINE) 1000 UNIT/ML DIALYSIS: 1000.0000 [IU] | INTRAMUSCULAR | Status: DC | PRN

## 2017-01-13 MED ORDER — SODIUM CHLORIDE 0.9 % IV SOLN: 100.0000 mL | INTRAVENOUS | Status: DC | PRN

## 2017-01-13 MED ORDER — PENTAFLUOROPROP-TETRAFLUOROETH EX AERO
1.0000 | INHALATION_SPRAY | CUTANEOUS | Status: DC | PRN
Start: 2017-01-13 — End: 2017-01-13

## 2017-01-13 MED ORDER — GUAIFENESIN-DM 100-10 MG/5ML PO SYRP
5.0000 mL | ORAL_SOLUTION | ORAL | Status: DC | PRN
  Administered 2017-01-13 – 2017-01-15 (×2): 5 mL via ORAL
  Filled 2017-01-13 (×2): qty 5

## 2017-01-13 MED ORDER — GUAIFENESIN 100 MG/5ML PO SOLN
5.0000 mL | ORAL | Status: DC | PRN
  Filled 2017-01-13: qty 25

## 2017-01-13 MED ORDER — LIDOCAINE HCL (PF) 1 % IJ SOLN: 5.0000 mL | INTRAMUSCULAR | Status: DC | PRN

## 2017-01-13 NOTE — Progress Notes (Signed)
ANTICOAGULATION CONSULT NOTE  Pharmacy Consult: Heparin Indication: atrial fibrillation  Allergies  Allergen Reactions  . Ace Inhibitors Cough  . Amlodipine Swelling    UNSPECIFIED EDEMA   . Codeine Rash    Patient Measurements: Height: 5\' 8"  (172.7 cm) Weight: 196 lb 10.4 oz (89.2 kg)(scale c) IBW/kg (Calculated) : 63.9  Vital Signs: Temp: 98 F (36.7 C) (11/09 0352) Temp Source: Oral (11/09 0352) BP: 114/46 (11/09 0352) Pulse Rate: 53 (11/09 0352)  Labs: Recent Labs    01/11/17 0347 01/11/17 1647 01/11/17 2130 01/12/17 0503  HGB  --  9.3*  --  9.2*  HCT  --  31.5*  --  31.2*  PLT  --  131*  --  160  LABPROT 22.3*  --   --  18.3*  INR 1.98  --   --  1.54  HEPARINUNFRC  --   --  0.47 0.49  CREATININE 4.03*  --   --  3.36*    Assessment: 54 YOF with history of Afib on Coumadin PTA.  Patient was transitioned to heparin bridge for HD catheter placement, currently planned for Monday.  Heparin level is therapeutic; no bleeding reported.   Goal of Therapy:  Heparin level 0.3-0.7 units/ml Monitor platelets by anticoagulation protocol: Yes    Plan:  Continue heparin gtt at 1000 units/hr Daily heparin level and CBC Watch CBGs   Eastyn Dattilo D. Mina Marble, PharmD, BCPS Pager:  [phone] 01/13/2017, 8:12 AM

## 2017-01-13 NOTE — Telephone Encounter (Signed)
Patient asking for a letter stating that Dr Bronson Ing took her out of work for good.

## 2017-01-13 NOTE — Telephone Encounter (Signed)
Pt made aware and requested we fax letter to her workplace attn Verline Lema [phone]. Will compose letter and fax.

## 2017-01-13 NOTE — Progress Notes (Signed)
PT Cancellation Note  Patient Details Name: [name] MRN: [id number] DOB: 1945/07/09   Cancelled Treatment:    Reason Eval/Treat Not Completed: Patient at procedure or test/unavailable. Off floor for HD. Will follow-up for PT treatment as time permits.  Mabeline Caras, PT, DPT Acute Rehab Services  Pager: [place] 01/13/2017, 1:00 PM

## 2017-01-13 NOTE — Telephone Encounter (Signed)
LM to return call.

## 2017-01-13 NOTE — Telephone Encounter (Signed)
I don't see this mentioned at [place] on 10/10 - do we give her a letter for work and if so how long?

## 2017-01-13 NOTE — Progress Notes (Signed)
[facility] PROGRESS NOTE    Progress Note  [name]  UDJ:[id number] DOB: 1945-06-26 DOA: 12/23/2016 PCP: [name], MD     Brief Narrative:   [name] is an 71 y.o. female with medical history significant for insulin-dependent diabetes mellitus, chronic kidney disease stage IV, chronic combined systolic/diastolic CHF, chronic atrial fibrillation, and coronary artery disease, presented to the ED at the direction of her nephrologist for evaluation of swelling and fatigue. She has been followed by cardiology and nephrology for worsening fluid status complicating worsening kidney function. She continued to be volume overloaded despite increase in her diuretics recently. Admitted with acute on chronic diastolic heart failure secondary to Acute on chronic renal failure.  Nephrology and cardiology following.  Unable to manage volume status despite aggressive IV diuresis.  Now deemed ESRD.  Temporary right IJ HD catheter placed and HD started on 01/07/17.     Assessment/Plan:   Principal Problem:   CKD (chronic kidney disease), stage IV (HCC) Active Problems:   Essential hypertension   Coronary artery disease   Acute on chronic combined systolic and diastolic CHF (congestive heart failure) (HCC)   Elevated troponin   DM type 2 causing vascular disease (HCC)   Pleural effusion on right   Anemia in chronic kidney disease   Atrial fibrillation (HCC)   Hypokalemia   Hypervolemia   Rhonchi  Acute on chronic combined systolic and diastolic decompensated heart failure: - 2D echo in November 11, 2016 showed an EF of 30% with grade 2 diastolic heart failure. - On admission cardiology and nephrology were consulted, despite extensive efforts to manage his diuresis patient did not make status viable progress diuresing. - After extensive discussion with family patient agreed to proceed with hemodialysis which was started on 01/07/2017 - volume manage with HD  Acute kidney  injury superimposed on chronic renal disease stage IV leading to stage V now on hemodialysis: - Underwent AV graft placement on 12/30/2016. - Nephrology initiated dialysis on 01/07/2017, he has had 4 days of HD. - Appreciate nephrology's assistance, they will proceed with tunneled catheter before DC home. -need tunnel catheter prior to discharge. Hold plavix and coumadin.   Paroxysmal atrial fibrillation: - Cardiology was consulted they recommended to continue amiodarone, metoprolol, Plavix. - Coumadin was held we are to proceed with tunneled catheter. - INR 1.5---hold coumadin, started  heparin for  bridge.  Continue with heparin.   Coronary artery disease: - Asymptomatic continue with  mdur metoprolol and statins. -hold Plavix., for tunnel catheter  Diabetes mellitus type 2: - Uncontrolled with mild fluctuations continue current regimen, long-acting insulin plus sliding scale. -hypoglycemia-- will decrease lantus to 15 units   Hypokalemia: - To be repleated during HD.  Anemia chronic disease: - Seems to be stable continue iron supplementation.  Leukopenia: - Of unclear etiology follow-up with outpatient PCP. -mildly decreased.   Disposition: Patient was evaluated by physical therapy and occupational therapy they recommended skilled nursing facility, the patient cannot be discharged to skilled nursing facility once tunneled catheter has been placed and used. Patient would like to go home at time of discharge  DVT prophylaxis: coumadin Family Communication:None Disposition Plan/Barrier to D/C: home in 3-4 days Code Status:     Code Status Orders  (From admission, onward)        Start     Ordered   12/23/16 2108  Full code  Continuous     12/23/16 2119    Code Status History    Date Active  Date Inactive Code Status Order ID Comments User Context   12/03/2016 11:29 12/12/2016 21:34 Full Code [id number]  Radene Gunning, NP Inpatient   11/21/2016 13:06 11/27/2016 22:43 Full  Code [id number]  Annita Brod, MD Inpatient   11/10/2016 23:59 11/16/2016 20:25 Full Code [id number]  Phillips Grout, MD Inpatient   10/31/2016 17:15 11/01/2016 17:52 Full Code [id number]  Erline Hau, MD Inpatient   08/07/2016 15:05 08/08/2016 20:44 Full Code [id number]  Oswald Hillock, MD Inpatient   07/14/2016 09:55 07/16/2016 19:09 Full Code [id number]  Albertine Angalina, MD Inpatient   07/09/2016 08:54 07/12/2016 19:16 Full Code [id number]  Oswald Hillock, MD Inpatient   12/01/2015 01:19 12/03/2015 19:12 Full Code [id number]  Larey Dresser, MD Inpatient   10/10/2015 06:52 10/14/2015 18:57 Full Code [id number]  Phillips Grout, MD Inpatient   07/06/2015 16:27 07/12/2015 22:41 Full Code [id number]  Lavina Hamman, MD Inpatient        IV Access:    Peripheral IV   Procedures and diagnostic studies:   No results found.   Medical Consultants:    None.  Anti-Infectives:  None  Subjective:    No new complaints. Had diarrhea yesterday, which has resolved.    Objective:    Vitals:   01/12/17 1630 01/12/17 2046 01/12/17 2217 01/13/17 0352  BP: (!) 105/48 (!) 114/50 (!) 116/46 (!) 114/46  Pulse: 62 63 62 (!) 53  Resp: 16 18  18   Temp: 98.6 F (37 C) 98.5 F (36.9 C)  98 F (36.7 C)  TempSrc: Oral Oral  Oral  SpO2: 98% 100%    Weight:    89.2 kg (196 lb 10.4 oz)  Height:        Intake/Output Summary (Last 24 hours) at 01/13/2017 1217 Last data filed at 01/13/2017 0936 Gross per 24 hour  Intake 1040 ml  Output 0 ml  Net 1040 ml   Filed Weights   01/11/17 1734 01/12/17 0448 01/13/17 0352  Weight: 86.6 kg (190 lb 14.7 oz) 86.5 kg (190 lb 12.8 oz) 89.2 kg (196 lb 10.4 oz)    Exam: General exam; NAD Respiratory system: Normal respiratory effort, CTA Cardiovascular system: S 1, S 2 RRR Gastrointestinal system: BS present, soft, nt Central nervous system: Non focal.  Extremities: no edema Skin: No rashes, lesions or ulcers    Data Reviewed:    Labs: Basic  Metabolic Panel: Recent Labs  Lab 01/09/17 0400 01/10/17 0751 01/11/17 0347 01/12/17 0503 01/13/17 0936  NA 136 136 136 136 134*  K 3.5 3.8 3.8 4.1 3.9  CL 99* 99* 101 100* 98*  CO2 28 29 27 28 26   GLUCOSE 139* 90 125* 111* 191*  BUN 35* 23* 33* 20 31*  CREATININE 3.10* 3.16* 4.03* 3.36* 4.87*  CALCIUM 8.3* 8.4* 8.7* 8.6* 9.0  PHOS 3.2 3.2 4.2 2.8 4.1   GFR Estimated Creatinine Clearance: 12.2 mL/min (A) (by C-G formula based on SCr of 4.87 mg/dL (H)). Liver Function Tests: Recent Labs  Lab 01/09/17 0400 01/10/17 0751 01/11/17 0347 01/12/17 0503 01/13/17 0936  ALBUMIN 2.6* 2.5* 2.4* 2.5* 2.7*   No results for input(s): LIPASE, AMYLASE in the last 168 hours. No results for input(s): AMMONIA in the last 168 hours. Coagulation profile Recent Labs  Lab 01/08/17 0519 01/09/17 0400 01/10/17 0751 01/11/17 0347 01/12/17 0503  INR 3.17 2.12 2.11 1.98 1.54    CBC: Recent Labs  Lab 01/07/17 0649 01/08/17 0519 01/09/17 0400 01/11/17 1647 01/12/17 0503  WBC 3.8* 3.2* 3.5* 3.5* 3.9*  HGB 8.7* 8.8* 8.7* 9.3* 9.2*  HCT 29.6* 29.1* 29.6* 31.5* 31.2*  MCV 97.4 98.6 98.3 98.1 99.7  PLT 195 186 175 131* 160   Cardiac Enzymes: No results for input(s): CKTOTAL, CKMB, CKMBINDEX, TROPONINI in the last 168 hours. BNP (last 3 results) No results for input(s): PROBNP in the last 8760 hours. CBG: Recent Labs  Lab 01/12/17 0832 01/12/17 1135 01/12/17 1632 01/12/17 2112 01/13/17 0756  GLUCAP 90 150* 181* 191* 169*   D-Dimer: No results for input(s): DDIMER in the last 72 hours. Hgb A1c: No results for input(s): HGBA1C in the last 72 hours. Lipid Profile: No results for input(s): CHOL, HDL, LDLCALC, TRIG, CHOLHDL, LDLDIRECT in the last 72 hours. Thyroid function studies: No results for input(s): TSH, T4TOTAL, T3FREE, THYROIDAB in the last 72 hours.  Invalid input(s): FREET3 Anemia work up: No results for input(s): VITAMINB12, FOLATE, FERRITIN, TIBC, IRON, RETICCTPCT  in the last 72 hours. Sepsis Labs: Recent Labs  Lab 01/08/17 0519 01/09/17 0400 01/11/17 1647 01/12/17 0503  WBC 3.2* 3.5* 3.5* 3.9*   Microbiology No results found for this or any previous visit (from the past 240 hour(s)).   Medications:   . ALPRAZolam  0.5 mg Oral QHS  . amiodarone  200 mg Oral Daily  . calcitRIOL  0.5 mcg Oral Daily  . cyclobenzaprine  5 mg Oral QHS  . darbepoetin (ARANESP) injection - NON-DIALYSIS  150 mcg Subcutaneous Q Mon-1800  . fluticasone  1 spray Each Nare Daily  . insulin aspart  0-15 Units Subcutaneous TID WC  . insulin glargine  15 Units Subcutaneous QHS  . isosorbide mononitrate  30 mg Oral BID  . latanoprost  1 drop Both Eyes QHS  . loratadine  10 mg Oral Daily  . meclizine  12.5 mg Oral BID  . mouth rinse  15 mL Mouth Rinse BID  . metoprolol succinate  25 mg Oral QHS  . pantoprazole  40 mg Oral Daily  . rosuvastatin  40 mg Oral q1800  . senna-docusate  2 tablet Oral BID  . sodium chloride flush  3 mL Intravenous Q12H   Continuous Infusions: . sodium chloride    . sodium chloride 10 mL/hr at 01/09/17 0418  . sodium chloride    . sodium chloride    . sodium chloride    . sodium chloride    . heparin 1,000 Units/hr (01/12/17 0956)      LOS: 21 days   Niel Hummer A  [facility] Pager [phone]    01/13/2017, 12:17 PM

## 2017-01-13 NOTE — Progress Notes (Signed)
[facility] ROUNDING NOTE   Subjective:   Interval History:  Appears comfortable this morning  She is awaiting the tunneling of her hemodialysis catheter until she can be discharged to outpatient dialysis facility  She has been on both coumadin and plavix  There has been concern that she will need to be off plavix 5 days prior to tunneling of her catheter and this will be performed on Monday 11/12.   Objective:  Vital signs in last 24 hours:  Temp:  [98 F (36.7 C)-99 F (37.2 C)] 98 F (36.7 C) (11/09 0352) Pulse Rate:  [53-63] 53 (11/09 0352) Resp:  [16-18] 18 (11/09 0352) BP: (105-119)/(46-54) 114/46 (11/09 0352) SpO2:  [94 %-100 %] 100 % (11/08 2046) Weight:  [196 lb 10.4 oz (89.2 kg)] 196 lb 10.4 oz (89.2 kg) (11/09 0352)  Weight change: 14.1 oz (0.4 kg) Filed Weights   01/11/17 1734 01/12/17 0448 01/13/17 0352  Weight: 190 lb 14.7 oz (86.6 kg) 190 lb 12.8 oz (86.5 kg) 196 lb 10.4 oz (89.2 kg)    Intake/Output: I/O last 3 completed shifts: In: 1643.8 [P.O.:1208; I.V.:435.8] Out: 0    Intake/Output this shift:  No intake/output data recorded.  Gen: NAD HEENT: + JVD. R IJ nontunneled HD catheter in place CVS: Regular rate and rhythm  Resp: bilaterally decreased breath sounds at bases,with few basal crackles. Abd: soft, non tender, bowel sounds positive. Ext: 1 +lower extremity edema,slightly improved,left AVG with good bruit.     Basic Metabolic Panel: Recent Labs  Lab 01/08/17 0519 01/09/17 0400 01/10/17 0751 01/11/17 0347 01/12/17 0503  NA 137 136 136 136 136  K 3.2* 3.5 3.8 3.8 4.1  CL 102 99* 99* 101 100*  CO2 26 28 29 27 28   GLUCOSE 255* 139* 90 125* 111*  BUN 51* 35* 23* 33* 20  CREATININE 2.82* 3.10* 3.16* 4.03* 3.36*  CALCIUM 7.6* 8.3* 8.4* 8.7* 8.6*  PHOS 3.1 3.2 3.2 4.2 2.8    Liver Function Tests: Recent Labs  Lab 01/08/17 0519 01/09/17 0400 01/10/17 0751 01/11/17 0347 01/12/17 0503  ALBUMIN 2.2* 2.6* 2.5* 2.4*  2.5*   No results for input(s): LIPASE, AMYLASE in the last 168 hours. No results for input(s): AMMONIA in the last 168 hours.  CBC: Recent Labs  Lab 01/07/17 0649 01/08/17 0519 01/09/17 0400 01/11/17 1647 01/12/17 0503  WBC 3.8* 3.2* 3.5* 3.5* 3.9*  HGB 8.7* 8.8* 8.7* 9.3* 9.2*  HCT 29.6* 29.1* 29.6* 31.5* 31.2*  MCV 97.4 98.6 98.3 98.1 99.7  PLT 195 186 175 131* 160    Cardiac Enzymes: No results for input(s): CKTOTAL, CKMB, CKMBINDEX, TROPONINI in the last 168 hours.  BNP: Invalid input(s): POCBNP  CBG: Recent Labs  Lab 01/12/17 0832 01/12/17 1135 01/12/17 1632 01/12/17 2112 01/13/17 0756  GLUCAP 90 150* 181* 191* 169*    Microbiology: Results for orders placed or performed during the hospital encounter of 12/23/16  MRSA PCR Screening     Status: None   Collection Time: 12/24/16  1:39 AM  Result Value Ref Range Status   MRSA by PCR NEGATIVE NEGATIVE Final    Comment:        The GeneXpert MRSA Assay (FDA approved for NASAL specimens only), is one component of a comprehensive MRSA colonization surveillance program. It is not intended to diagnose MRSA infection nor to guide or monitor treatment for MRSA infections.   Surgical pcr screen     Status: None   Collection Time: 12/28/16  7:26 PM  Result Value Ref Range Status   MRSA, PCR NEGATIVE NEGATIVE Final   Staphylococcus aureus NEGATIVE NEGATIVE Final    Comment: (NOTE) The Xpert SA Assay (FDA approved for NASAL specimens in patients 61 years of age and older), is one component of a comprehensive surveillance program. It is not intended to diagnose infection nor to guide or monitor treatment.     Coagulation Studies: Recent Labs    01/11/17 0347 01/12/17 0503  LABPROT 22.3* 18.3*  INR 1.98 1.54    Urinalysis: No results for input(s): COLORURINE, LABSPEC, PHURINE, GLUCOSEU, HGBUR, BILIRUBINUR, KETONESUR, PROTEINUR, UROBILINOGEN, NITRITE, LEUKOCYTESUR in the last 72 hours.  Invalid  input(s): APPERANCEUR    Imaging: No results found.   Medications:   . sodium chloride    . sodium chloride 10 mL/hr at 01/09/17 0418  . sodium chloride    . sodium chloride    . heparin 1,000 Units/hr (01/12/17 0956)   . ALPRAZolam  0.5 mg Oral QHS  . amiodarone  200 mg Oral Daily  . calcitRIOL  0.5 mcg Oral Daily  . cyclobenzaprine  5 mg Oral QHS  . darbepoetin (ARANESP) injection - NON-DIALYSIS  150 mcg Subcutaneous Q Mon-1800  . fluticasone  1 spray Each Nare Daily  . insulin aspart  0-15 Units Subcutaneous TID WC  . insulin glargine  15 Units Subcutaneous QHS  . isosorbide mononitrate  30 mg Oral BID  . latanoprost  1 drop Both Eyes QHS  . loratadine  10 mg Oral Daily  . meclizine  12.5 mg Oral BID  . mouth rinse  15 mL Mouth Rinse BID  . metoprolol succinate  25 mg Oral QHS  . pantoprazole  40 mg Oral Daily  . rosuvastatin  40 mg Oral q1800  . senna-docusate  2 tablet Oral BID  . 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 She is taking coumadin and plavix and so these need to be discontinued prior to tunneling of catheter. I will proceed with dialysis #5 11/10   HFrEF. Cardiology is following  Anemia. Hemoglobin stable, had 1 dose of Feraheme. Aranesp 150 per week.  Hypertension. BPs a little soft.  Afib. Rate controlled with amiodarone and beta blocker, on warfarin  DM. per primary  Dialysis scheduled for tomorrow  She appears to be  clipped for [place]        LOS: 21 [name] W @TODAY @8 :07 AM

## 2017-01-13 NOTE — Telephone Encounter (Signed)
returning call

## 2017-01-13 NOTE — Telephone Encounter (Signed)
It would be fine to provide a letter stating that due to her multiple cardiovascular and renal morbidities, she can remain off from work indefinitely.

## 2017-01-13 NOTE — Progress Notes (Signed)
Patient arrived to unit by bed.  Reviewed treatment plan and this RN agrees with plan.  Report received from bedside RN, Lamelva.  Consent verified.  Patient A & o X 4.   Lung sounds diminished and clear to ausculation in all fields. BLE 1+ pitting edema. Cardiac:  NSR.  Removed caps and cleansed RIJ catheter with chlorhedxidine.  Aspirated ports of heparin and flushed them with saline per protocol.  Connected and secured lines, initiated treatment at 1201.  UF Goal of 3500 mL and net fluid removal 3 L.  Will continue to monitor.

## 2017-01-13 NOTE — Clinical Social Work Note (Signed)
CSW continues to follow for discharge needs.  Anisha Starliper, CSW [phone]  

## 2017-01-13 NOTE — Progress Notes (Signed)
Dialysis treatment completed.  3500 mL ultrafiltrated.  3000 mL net fluid removal.  Patient status unchanged. Lung sounds diminished to ausculation in all fields. BLE  edema. Cardiac: NSR.  Cleansed RIJ catheter with chlorhexidine.  Disconnected lines and flushed ports with saline per protocol.  Ports locked with heparin and capped per protocol.    Report given to bedside, RN Lamelva.

## 2017-01-14 LAB — CBC
HCT: 30.6 % — ABNORMAL LOW (ref 36.0–46.0)
Hemoglobin: 8.9 g/dL — ABNORMAL LOW (ref 12.0–15.0)
MCH: 28.6 pg (ref 26.0–34.0)
MCHC: 29.1 g/dL — AB (ref 30.0–36.0)
MCV: 98.4 fL (ref 78.0–100.0)
PLATELETS: 166 10*3/uL (ref 150–400)
RBC: 3.11 MIL/uL — AB (ref 3.87–5.11)
RDW: 20.3 % — AB (ref 11.5–15.5)
WBC: 3.9 10*3/uL — ABNORMAL LOW (ref 4.0–10.5)

## 2017-01-14 LAB — RENAL FUNCTION PANEL
ALBUMIN: 2.6 g/dL — AB (ref 3.5–5.0)
ANION GAP: 9 (ref 5–15)
BUN: 21 mg/dL — ABNORMAL HIGH (ref 6–20)
CALCIUM: 8.6 mg/dL — AB (ref 8.9–10.3)
CO2: 27 mmol/L (ref 22–32)
CREATININE: 3.73 mg/dL — AB (ref 0.44–1.00)
Chloride: 96 mmol/L — ABNORMAL LOW (ref 101–111)
GFR calc Af Amer: 13 mL/min — ABNORMAL LOW (ref >60)
GFR calc non Af Amer: 11 mL/min — ABNORMAL LOW (ref >60)
GLUCOSE: 159 mg/dL — AB (ref 65–99)
PHOSPHORUS: 3 mg/dL (ref 2.5–4.6)
Potassium: 3.9 mmol/L (ref 3.5–5.1)
SODIUM: 132 mmol/L — AB (ref 135–145)

## 2017-01-14 LAB — PROTIME-INR
INR: 1.26
Prothrombin Time: 15.7 seconds — ABNORMAL HIGH (ref 11.4–15.2)

## 2017-01-14 LAB — GLUCOSE, CAPILLARY
GLUCOSE-CAPILLARY: 189 mg/dL — AB (ref 65–99)
Glucose-Capillary: 104 mg/dL — ABNORMAL HIGH (ref 65–99)
Glucose-Capillary: 194 mg/dL — ABNORMAL HIGH (ref 65–99)

## 2017-01-14 LAB — HEPARIN LEVEL (UNFRACTIONATED): Heparin Unfractionated: 0.44 IU/mL (ref 0.30–0.70)

## 2017-01-14 MED ORDER — DICLOFENAC SODIUM 1 % TD GEL
2.0000 g | Freq: Two times a day (BID) | TRANSDERMAL | Status: DC
  Administered 2017-01-14 – 2017-01-16 (×6): 2 g via TOPICAL
  Filled 2017-01-14: qty 100

## 2017-01-14 MED ORDER — CALCITRIOL 0.5 MCG PO CAPS
ORAL_CAPSULE | ORAL | Status: AC
  Filled 2017-01-14: qty 1

## 2017-01-14 NOTE — Procedures (Signed)
I have seen and examined this patient and agree with the plan of care  Patient has been seen on dialysis   Vitals stable  Hb 8.9    150 mcg darbepoietin 11/5  Ca 8.6  Phos 3.0  Alb 2.6   Calcitriol 0.5 mcg  daily  Brizza Nathanson W 01/14/2017, 11:08 AM

## 2017-01-14 NOTE — Progress Notes (Signed)
ANTICOAGULATION CONSULT NOTE  Pharmacy Consult: Heparin Indication: atrial fibrillation  Allergies  Allergen Reactions  . Ace Inhibitors Cough  . Amlodipine Swelling    UNSPECIFIED EDEMA   . Codeine Rash    Patient Measurements: Height: 5\' 8"  (172.7 cm) Weight: 187 lb 4.8 oz (85 kg) IBW/kg (Calculated) : 63.9  Vital Signs: Temp: 98.4 F (36.9 C) (11/10 1241) Temp Source: Oral (11/10 1241) BP: 105/37 (11/10 1241) Pulse Rate: 61 (11/10 1241)  Labs: Recent Labs    01/12/17 0503 01/13/17 0936 01/13/17 1217 01/14/17 0809  HGB 9.2*  --  9.2* 8.9*  HCT 31.2*  --  31.5* 30.6*  PLT 160  --  165 166  LABPROT 18.3*  --  17.1* 15.7*  INR 1.54  --  1.40 1.26  HEPARINUNFRC 0.49  --  0.93* 0.44  CREATININE 3.36* 4.87*  --  3.73*    Assessment: 26 YOF with history of Afib on Coumadin PTA.  Patient was transitioned to heparin bridge for HD catheter placement, currently planned for Monday.   -heparin level at goal, CBC stable   Goal of Therapy:  Heparin level 0.3-0.7 units/ml Monitor platelets by anticoagulation protocol: Yes    Plan:  Continue heparin gtt at 1000 units/hr Daily heparin level and CBC  Hildred Laser, Pharm D 01/14/2017 1:03 PM

## 2017-01-14 NOTE — Progress Notes (Signed)
[facility] PROGRESS NOTE    Progress Note  [name]  JKD:[id number] DOB: May 24, 1945 DOA: 12/23/2016 PCP: [name], MD     Brief Narrative:   [name] is an 71 y.o. female with medical history significant for insulin-dependent diabetes mellitus, chronic kidney disease stage IV, chronic combined systolic/diastolic CHF, chronic atrial fibrillation, and coronary artery disease, presented to the ED at the direction of her nephrologist for evaluation of swelling and fatigue. She has been followed by cardiology and nephrology for worsening fluid status complicating worsening kidney function. She continued to be volume overloaded despite increase in her diuretics recently. Admitted with acute on chronic diastolic heart failure secondary to Acute on chronic renal failure.  Nephrology and cardiology following.  Unable to manage volume status despite aggressive IV diuresis.  Now deemed ESRD.  Temporary right IJ HD catheter placed and HD started on 01/07/17.     Assessment/Plan:   Principal Problem:   CKD (chronic kidney disease), stage IV (HCC) Active Problems:   Essential hypertension   Coronary artery disease   Acute on chronic combined systolic and diastolic CHF (congestive heart failure) (HCC)   Elevated troponin   DM type 2 causing vascular disease (HCC)   Pleural effusion on right   Anemia in chronic kidney disease   Atrial fibrillation (HCC)   Hypokalemia   Hypervolemia   Rhonchi  Acute on chronic combined systolic and diastolic decompensated heart failure: - 2D echo in November 11, 2016 showed an EF of 30% with grade 2 diastolic heart failure. - On admission cardiology and nephrology were consulted, despite extensive efforts to manage his diuresis patient did not make status viable progress diuresing. - After extensive discussion with family patient agreed to proceed with hemodialysis which was started on 01/07/2017 - volume manage with HD  Acute kidney  injury superimposed on chronic renal disease stage IV leading to stage V now on hemodialysis: - Underwent AV graft placement on 12/30/2016. - Nephrology initiated dialysis on 01/07/2017, he has had 4 days of HD. - Appreciate nephrology's assistance, they will proceed with tunneled catheter before DC home. -need tunnel catheter prior to discharge. Hold plavix and coumadin. For tunneling catheter on Monday   Paroxysmal atrial fibrillation: - Cardiology was consulted they recommended to continue amiodarone, metoprolol, Plavix. - Coumadin was held we are to proceed with tunneled catheter. - INR 1.5---hold coumadin, started  heparin for  bridge.  Continue with heparin.   Coronary artery disease: - Asymptomatic continue with  mdur metoprolol and statins. -hold Plavix., for tunnel catheter  Diabetes mellitus type 2: - Uncontrolled with mild fluctuations continue current regimen, long-acting insulin plus sliding scale. -hypoglycemia-- will decrease lantus to 15 units   Hypokalemia: - To be repleated during HD.  Anemia chronic disease: - Seems to be stable continue iron supplementation.  Leukopenia: - Of unclear etiology follow-up with outpatient PCP. -mildly decreased.   Left  heel pain; mild redness.  Voltaren PRN.   Disposition: Patient was evaluated by physical therapy and occupational therapy they recommended skilled nursing facility, the patient cannot be discharged to skilled nursing facility once tunneled catheter has been placed and used. Patient would like to go home at time of discharge  DVT prophylaxis: coumadin Family Communication:None Disposition Plan/Barrier to D/C: home in 3-4 days Code Status:     Code Status Orders  (From admission, onward)        Start     Ordered   12/23/16 2108  Full code  Continuous  12/23/16 2119    Code Status History    Date Active Date Inactive Code Status Order ID Comments User Context   12/03/2016 11:29 12/12/2016 21:34 Full  Code [id number]  Radene Gunning, NP Inpatient   11/21/2016 13:06 11/27/2016 22:43 Full Code [id number]  Annita Brod, MD Inpatient   11/10/2016 23:59 11/16/2016 20:25 Full Code [id number]  Phillips Grout, MD Inpatient   10/31/2016 17:15 11/01/2016 17:52 Full Code [id number]  Erline Hau, MD Inpatient   08/07/2016 15:05 08/08/2016 20:44 Full Code [id number]  Oswald Hillock, MD Inpatient   07/14/2016 09:55 07/16/2016 19:09 Full Code [id number]  Albertine Tashea, MD Inpatient   07/09/2016 08:54 07/12/2016 19:16 Full Code [id number]  Oswald Hillock, MD Inpatient   12/01/2015 01:19 12/03/2015 19:12 Full Code [id number]  Larey Dresser, MD Inpatient   10/10/2015 06:52 10/14/2015 18:57 Full Code [id number]  Phillips Grout, MD Inpatient   07/06/2015 16:27 07/12/2015 22:41 Full Code [id number]  Lavina Hamman, MD Inpatient        IV Access:    Peripheral IV   Procedures and diagnostic studies:   No results found.   Medical Consultants:    None.  Anti-Infectives:  None  Subjective:    Complaints of left heel pain, no others complaint   Objective:    Vitals:   01/14/17 0930 01/14/17 1000 01/14/17 1030 01/14/17 1241  BP: (!) 113/55 (!) 103/48 (!) 104/50 (!) 105/37  Pulse: 71 68 69 61  Resp: (!) 25 17 (!) 21 20  Temp:    98.4 F (36.9 C)  TempSrc:    Oral  SpO2:    100%  Weight:      Height:        Intake/Output Summary (Last 24 hours) at 01/14/2017 1301 Last data filed at 01/14/2017 1126 Gross per 24 hour  Intake 290 ml  Output 5000 ml  Net -4710 ml   Filed Weights   01/13/17 1153 01/13/17 1601 01/14/17 0614  Weight: 88.2 kg (194 lb 7.1 oz) 85.2 kg (187 lb 13.3 oz) 85 kg (187 lb 4.8 oz)    Exam: General exam; NAD Respiratory system: CTA Cardiovascular system: S 1, S 2 RRR Gastrointestinal system: BS present, soft, nt Central nervous system: non focal.  Extremities: trace edema.  Skin: mild redness right heel     Data Reviewed:    Labs: Basic Metabolic  Panel: Recent Labs  Lab 01/10/17 0751 01/11/17 0347 01/12/17 0503 01/13/17 0936 01/14/17 0809  NA 136 136 136 134* 132*  K 3.8 3.8 4.1 3.9 3.9  CL 99* 101 100* 98* 96*  CO2 29 27 28 26 27   GLUCOSE 90 125* 111* 191* 159*  BUN 23* 33* 20 31* 21*  CREATININE 3.16* 4.03* 3.36* 4.87* 3.73*  CALCIUM 8.4* 8.7* 8.6* 9.0 8.6*  PHOS 3.2 4.2 2.8 4.1 3.0   GFR Estimated Creatinine Clearance: 15.6 mL/min (A) (by C-G formula based on SCr of 3.73 mg/dL (H)). Liver Function Tests: Recent Labs  Lab 01/10/17 0751 01/11/17 0347 01/12/17 0503 01/13/17 0936 01/14/17 0809  ALBUMIN 2.5* 2.4* 2.5* 2.7* 2.6*   No results for input(s): LIPASE, AMYLASE in the last 168 hours. No results for input(s): AMMONIA in the last 168 hours. Coagulation profile Recent Labs  Lab 01/10/17 0751 01/11/17 0347 01/12/17 0503 01/13/17 1217 01/14/17 0809  INR 2.11 1.98 1.54 1.40 1.26    CBC: Recent Labs  Lab 01/09/17 0400 01/11/17 1647 01/12/17 0503 01/13/17 1217 01/14/17 0809  WBC 3.5* 3.5* 3.9* 4.0 3.9*  HGB 8.7* 9.3* 9.2* 9.2* 8.9*  HCT 29.6* 31.5* 31.2* 31.5* 30.6*  MCV 98.3 98.1 99.7 99.4 98.4  PLT 175 131* 160 165 166   Cardiac Enzymes: No results for input(s): CKTOTAL, CKMB, CKMBINDEX, TROPONINI in the last 168 hours. BNP (last 3 results) No results for input(s): PROBNP in the last 8760 hours. CBG: Recent Labs  Lab 01/12/17 2112 01/13/17 0756 01/13/17 1733 01/13/17 2048 01/14/17 1239  GLUCAP 191* 169* 76 141* 104*   D-Dimer: No results for input(s): DDIMER in the last 72 hours. Hgb A1c: No results for input(s): HGBA1C in the last 72 hours. Lipid Profile: No results for input(s): CHOL, HDL, LDLCALC, TRIG, CHOLHDL, LDLDIRECT in the last 72 hours. Thyroid function studies: No results for input(s): TSH, T4TOTAL, T3FREE, THYROIDAB in the last 72 hours.  Invalid input(s): FREET3 Anemia work up: No results for input(s): VITAMINB12, FOLATE, FERRITIN, TIBC, IRON, RETICCTPCT in the  last 72 hours. Sepsis Labs: Recent Labs  Lab 01/11/17 1647 01/12/17 0503 01/13/17 1217 01/14/17 0809  WBC 3.5* 3.9* 4.0 3.9*   Microbiology No results found for this or any previous visit (from the past 240 hour(s)).   Medications:   . ALPRAZolam  0.5 mg Oral QHS  . amiodarone  200 mg Oral Daily  . calcitRIOL  0.5 mcg Oral Daily  . cyclobenzaprine  5 mg Oral QHS  . darbepoetin (ARANESP) injection - NON-DIALYSIS  150 mcg Subcutaneous Q Mon-1800  . diclofenac sodium  2 g Topical BID  . fluticasone  1 spray Each Nare Daily  . insulin aspart  0-15 Units Subcutaneous TID WC  . insulin glargine  15 Units Subcutaneous QHS  . isosorbide mononitrate  30 mg Oral BID  . latanoprost  1 drop Both Eyes QHS  . loratadine  10 mg Oral Daily  . meclizine  12.5 mg Oral BID  . mouth rinse  15 mL Mouth Rinse BID  . metoprolol succinate  25 mg Oral QHS  . pantoprazole  40 mg Oral Daily  . rosuvastatin  40 mg Oral q1800  . senna-docusate  2 tablet Oral BID  . sodium chloride flush  3 mL Intravenous Q12H   Continuous Infusions: . sodium chloride    . sodium chloride 10 mL/hr at 01/14/17 0200  . heparin 1,000 Units/hr (01/14/17 0200)      LOS: 22 days   Niel Hummer A  [facility] Pager [phone]    01/14/2017, 1:01 PM

## 2017-01-14 NOTE — Progress Notes (Signed)
[facility] ROUNDING NOTE   Subjective:   Interval History:  Appears comfortable this morning  She is awaiting the tunneling of her hemodialysis catheter until she can be discharged to outpatient dialysis facility  She has been on both coumadin and plavix  There has been concern that she will need to be off plavix 5 days prior to tunneling of her catheter and this will be performed on Monday 11/12.   Objective:  Vital signs in last 24 hours:  Temp:  [97.8 F (36.6 C)-98.6 F (37 C)] 98.5 F (36.9 C) (11/10 0717) Pulse Rate:  [57-76] 69 (11/10 1030) Resp:  [17-25] 21 (11/10 1030) BP: (103-129)/(41-57) 104/50 (11/10 1030) SpO2:  [100 %] 100 % (11/10 0614) Weight:  [187 lb 4.8 oz (85 kg)-194 lb 7.1 oz (88.2 kg)] 187 lb 4.8 oz (85 kg) (11/10 2536)  Weight change: -3.3 oz (-1 kg) Filed Weights   01/13/17 1153 01/13/17 1601 01/14/17 0614  Weight: 194 lb 7.1 oz (88.2 kg) 187 lb 13.3 oz (85.2 kg) 187 lb 4.8 oz (85 kg)    Intake/Output: I/O last 3 completed shifts: In: 23 [P.O.:120; I.V.:390] Out: 3000 [Other:3000]   Intake/Output this shift:  No intake/output data recorded.  Gen: NAD HEENT: + JVD. R IJ nontunneled HD catheter in place CVS: Regular rate and rhythm  Resp: bilaterally decreased breath sounds at bases,with few basal crackles. Abd: soft, non tender, bowel sounds positive. Ext: 1 +lower extremity edema,slightly improved,left AVG with good bruit.     Basic Metabolic Panel: Recent Labs  Lab 01/10/17 0751 01/11/17 0347 01/12/17 0503 01/13/17 0936 01/14/17 0809  NA 136 136 136 134* 132*  K 3.8 3.8 4.1 3.9 3.9  CL 99* 101 100* 98* 96*  CO2 29 27 28 26 27   GLUCOSE 90 125* 111* 191* 159*  BUN 23* 33* 20 31* 21*  CREATININE 3.16* 4.03* 3.36* 4.87* 3.73*  CALCIUM 8.4* 8.7* 8.6* 9.0 8.6*  PHOS 3.2 4.2 2.8 4.1 3.0    Liver Function Tests: Recent Labs  Lab 01/10/17 0751 01/11/17 0347 01/12/17 0503 01/13/17 0936 01/14/17 0809  ALBUMIN  2.5* 2.4* 2.5* 2.7* 2.6*   No results for input(s): LIPASE, AMYLASE in the last 168 hours. No results for input(s): AMMONIA in the last 168 hours.  CBC: Recent Labs  Lab 01/09/17 0400 01/11/17 1647 01/12/17 0503 01/13/17 1217 01/14/17 0809  WBC 3.5* 3.5* 3.9* 4.0 3.9*  HGB 8.7* 9.3* 9.2* 9.2* 8.9*  HCT 29.6* 31.5* 31.2* 31.5* 30.6*  MCV 98.3 98.1 99.7 99.4 98.4  PLT 175 131* 160 165 166    Cardiac Enzymes: No results for input(s): CKTOTAL, CKMB, CKMBINDEX, TROPONINI in the last 168 hours.  BNP: Invalid input(s): POCBNP  CBG: Recent Labs  Lab 01/12/17 1632 01/12/17 2112 01/13/17 0756 01/13/17 1733 01/13/17 2048  GLUCAP 181* 191* 169* 94 141*    Microbiology: Results for orders placed or performed during the hospital encounter of 12/23/16  MRSA PCR Screening     Status: None   Collection Time: 12/24/16  1:39 AM  Result Value Ref Range Status   MRSA by PCR NEGATIVE NEGATIVE Final    Comment:        The GeneXpert MRSA Assay (FDA approved for NASAL specimens only), is one component of a comprehensive MRSA colonization surveillance program. It is not intended to diagnose MRSA infection nor to guide or monitor treatment for MRSA infections.   Surgical pcr screen     Status: None   Collection Time: 12/28/16  7:26 PM  Result Value Ref Range Status   MRSA, PCR NEGATIVE NEGATIVE Final   Staphylococcus aureus NEGATIVE NEGATIVE Final    Comment: (NOTE) The Xpert SA Assay (FDA approved for NASAL specimens in patients 1 years of age and older), is one component of a comprehensive surveillance program. It is not intended to diagnose infection nor to guide or monitor treatment.     Coagulation Studies: Recent Labs    01/12/17 0503 01/13/17 1217 01/14/17 0809  LABPROT 18.3* 17.1* 15.7*  INR 1.54 1.40 1.26    Urinalysis: No results for input(s): COLORURINE, LABSPEC, PHURINE, GLUCOSEU, HGBUR, BILIRUBINUR, KETONESUR, PROTEINUR, UROBILINOGEN, NITRITE,  LEUKOCYTESUR in the last 72 hours.  Invalid input(s): APPERANCEUR    Imaging: No results found.   Medications:   . sodium chloride    . sodium chloride 10 mL/hr at 01/14/17 0200  . heparin 1,000 Units/hr (01/14/17 0200)   . ALPRAZolam  0.5 mg Oral QHS  . amiodarone  200 mg Oral Daily  . calcitRIOL  0.5 mcg Oral Daily  . cyclobenzaprine  5 mg Oral QHS  . darbepoetin (ARANESP) injection - NON-DIALYSIS  150 mcg Subcutaneous Q Mon-1800  . fluticasone  1 spray Each Nare Daily  . insulin aspart  0-15 Units Subcutaneous TID WC  . insulin glargine  15 Units Subcutaneous QHS  . isosorbide mononitrate  30 mg Oral BID  . latanoprost  1 drop Both Eyes QHS  . loratadine  10 mg Oral Daily  . meclizine  12.5 mg Oral BID  . mouth rinse  15 mL Mouth Rinse BID  . metoprolol succinate  25 mg Oral QHS  . pantoprazole  40 mg Oral Daily  . rosuvastatin  40 mg Oral q1800  . senna-docusate  2 tablet Oral BID  . sodium chloride flush  3 mL Intravenous Q12H   sodium chloride, acetaminophen, albuterol, diazepam, fentaNYL (SUBLIMAZE) injection, fentaNYL (SUBLIMAZE) injection, guaiFENesin-dextromethorphan, ondansetron (ZOFRAN) IV, sodium chloride flush, sodium chloride flush  Assessment/ Plan:  AKI with CKD-->ESRD: Will initiate CLIP.( called will need case management as patient lives in [place])Needs her HD catheter tunneled before d/c.Will arrange tunneling of catheter. She is taking coumadin and plavix and so these need to be discontinued prior to tunneling of catheter She is seen on dialysis and receiving her 5th dialysis treatment   HFrEF. Cardiology is following  Anemia. Hemoglobin stable, had 1 dose of Feraheme. Aranesp 150 per week.  Hypertension. BPs a little soft.  Afib. Rate controlled with amiodarone and beta blocker, on warfarin  DM. per primary  She appears to be clipped for [place]       LOS: 22 [name] W @TODAY @11 :06 AM

## 2017-01-15 LAB — RENAL FUNCTION PANEL
ALBUMIN: 2.9 g/dL — AB (ref 3.5–5.0)
Anion gap: 10 (ref 5–15)
BUN: 21 mg/dL — AB (ref 6–20)
CO2: 24 mmol/L (ref 22–32)
CREATININE: 3.71 mg/dL — AB (ref 0.44–1.00)
Calcium: 8.9 mg/dL (ref 8.9–10.3)
Chloride: 96 mmol/L — ABNORMAL LOW (ref 101–111)
GFR calc Af Amer: 13 mL/min — ABNORMAL LOW (ref >60)
GFR, EST NON AFRICAN AMERICAN: 11 mL/min — AB (ref >60)
Glucose, Bld: 148 mg/dL — ABNORMAL HIGH (ref 65–99)
PHOSPHORUS: 3.3 mg/dL (ref 2.5–4.6)
POTASSIUM: 3.9 mmol/L (ref 3.5–5.1)
Sodium: 130 mmol/L — ABNORMAL LOW (ref 135–145)

## 2017-01-15 LAB — PROTIME-INR
INR: 1.19
PROTHROMBIN TIME: 15 s (ref 11.4–15.2)

## 2017-01-15 LAB — GLUCOSE, CAPILLARY
GLUCOSE-CAPILLARY: 152 mg/dL — AB (ref 65–99)
GLUCOSE-CAPILLARY: 265 mg/dL — AB (ref 65–99)
Glucose-Capillary: 159 mg/dL — ABNORMAL HIGH (ref 65–99)
Glucose-Capillary: 180 mg/dL — ABNORMAL HIGH (ref 65–99)

## 2017-01-15 LAB — CBC
HCT: 33.9 % — ABNORMAL LOW (ref 36.0–46.0)
Hemoglobin: 10 g/dL — ABNORMAL LOW (ref 12.0–15.0)
MCH: 28.7 pg (ref 26.0–34.0)
MCHC: 29.5 g/dL — AB (ref 30.0–36.0)
MCV: 97.4 fL (ref 78.0–100.0)
Platelets: 131 10*3/uL — ABNORMAL LOW (ref 150–400)
RBC: 3.48 MIL/uL — ABNORMAL LOW (ref 3.87–5.11)
RDW: 19.7 % — AB (ref 11.5–15.5)
WBC: 4 10*3/uL (ref 4.0–10.5)

## 2017-01-15 LAB — HEPARIN LEVEL (UNFRACTIONATED): HEPARIN UNFRACTIONATED: 0.36 [IU]/mL (ref 0.30–0.70)

## 2017-01-15 NOTE — Progress Notes (Signed)
[facility]  Subjective  - some numbness in hand   Objective 100/67 (!) 58 98.2 F (36.8 C) (Oral) 18 100%  Intake/Output Summary (Last 24 hours) at 01/15/2017 1011 Last data filed at 01/15/2017 0600 Gross per 24 hour  Intake 600 ml  Output 2000 ml  Net -1400 ml   Left upper extremity + bruit in graft incisions clean and healing, absent radial pulse, left hand slightly cooler than right  Assessment/Planning: Pt still with numbness in left hand mainly in fingertips.  This was present immediately post op.  It may be slightly worse.  No real weakness currently.  Pt thinks symptoms are a nuisance but may be tolerable.  Will arrange follow up with Dr Scot Dock in a few weeks.  If symptoms continue to worsen will consider banding versus ligation of graft.  Ruta Hinds 01/15/2017 10:11 AM --  Laboratory Lab Results: Recent Labs    01/13/17 1217 01/14/17 0809  WBC 4.0 3.9*  HGB 9.2* 8.9*  HCT 31.5* 30.6*  PLT 165 166   BMET Recent Labs    01/13/17 0936 01/14/17 0809  NA 134* 132*  K 3.9 3.9  CL 98* 96*  CO2 26 27  GLUCOSE 191* 159*  BUN 31* 21*  CREATININE 4.87* 3.73*  CALCIUM 9.0 8.6*    COAG Lab Results  Component Value Date   INR 1.26 01/14/2017   INR 1.40 01/13/2017   INR 1.54 01/12/2017   No results found for: PTT

## 2017-01-15 NOTE — Progress Notes (Signed)
[facility] PROGRESS NOTE    Progress Note  [name]  XVQ:[id number] DOB: [birth year] DOA: 12/23/2016 PCP: [name], MD     Brief Narrative:   [name] is an 71 y.o. female with medical history significant for insulin-dependent diabetes mellitus, chronic kidney disease stage IV, chronic combined systolic/diastolic CHF, chronic atrial fibrillation, and coronary artery disease, presented to the ED at the direction of her nephrologist for evaluation of swelling and fatigue. She has been followed by cardiology and nephrology for worsening fluid status complicating worsening kidney function. She continued to be volume overloaded despite increase in her diuretics recently. Admitted with acute on chronic diastolic heart failure secondary to Acute on chronic renal failure.  Nephrology and cardiology following.  Unable to manage volume status despite aggressive IV diuresis.  Now deemed ESRD.  Temporary right IJ HD catheter placed and HD started on 01/07/17.     Assessment/Plan:   Principal Problem:   CKD (chronic kidney disease), stage IV (HCC) Active Problems:   Essential hypertension   Coronary artery disease   Acute on chronic combined systolic and diastolic CHF (congestive heart failure) (HCC)   Elevated troponin   DM type 2 causing vascular disease (HCC)   Pleural effusion on right   Anemia in chronic kidney disease   Atrial fibrillation (HCC)   Hypokalemia   Hypervolemia   Rhonchi  Acute on chronic combined systolic and diastolic decompensated heart failure: - 2D echo in November 11, 2016 showed an EF of 30% with grade 2 diastolic heart failure. - On admission cardiology and nephrology were consulted, despite extensive efforts to manage his diuresis patient did not make status viable progress diuresing. - After extensive discussion with family patient agreed to proceed with hemodialysis which was started on 01/07/2017 - volume manage with HD  Acute kidney  injury superimposed on chronic renal disease stage IV leading to stage V now on hemodialysis: - Underwent AV graft placement on 12/30/2016. - Nephrology initiated dialysis on 01/07/2017, he has had 4 days of HD. - Appreciate nephrology's assistance, they will proceed with tunneled catheter before DC home. -need tunnel catheter prior to discharge. Hold plavix and coumadin. For tunneling catheter on Monday   Paroxysmal atrial fibrillation: - Cardiology was consulted they recommended to continue amiodarone, metoprolol, Plavix. - Coumadin was held we are to proceed with tunneled catheter. - INR 1.5---hold coumadin, started  heparin for  bridge.  Continue with heparin.   Coronary artery disease: - Asymptomatic continue with  mdur metoprolol and statins. -hold Plavix., for tunnel catheter  Diabetes mellitus type 2: - Uncontrolled with mild fluctuations continue current regimen, long-acting insulin plus sliding scale. -hypoglycemia-- will decrease lantus to 15 units   Hypokalemia: - To be repleated during HD.  Anemia chronic disease: - Seems to be stable continue iron supplementation.  Leukopenia: - Of unclear etiology follow-up with outpatient PCP. -mildly decreased.   Left hand numbness; recent AV graft.  Will inform vascular about numbness.   Left  heel pain; mild redness.  Voltaren PRN.   Disposition: Patient was evaluated by physical therapy and occupational therapy they recommended skilled nursing facility, the patient cannot be discharged to skilled nursing facility once tunneled catheter has been placed and used. Patient would like to go home at time of discharge  DVT prophylaxis: coumadin Family Communication:None Disposition Plan/Barrier to D/C: home in 3-4 days Code Status:     Code Status Orders  (From admission, onward)        Start  Ordered   12/23/16 2108  Full code  Continuous     12/23/16 2119    Code Status History    Date Active Date Inactive Code  Status Order ID Comments User Context   12/03/2016 11:29 12/12/2016 21:34 Full Code [id number]  Radene Gunning, NP Inpatient   11/21/2016 13:06 11/27/2016 22:43 Full Code [id number]  Annita Brod, MD Inpatient   11/10/2016 23:59 11/16/2016 20:25 Full Code [id number]  Phillips Grout, MD Inpatient   10/31/2016 17:15 11/01/2016 17:52 Full Code [id number]  Erline Hau, MD Inpatient   08/07/2016 15:05 08/08/2016 20:44 Full Code [id number]  Oswald Hillock, MD Inpatient   07/14/2016 09:55 07/16/2016 19:09 Full Code [id number]  Albertine Aidel, MD Inpatient   07/09/2016 08:54 07/12/2016 19:16 Full Code [id number]  Oswald Hillock, MD Inpatient   12/01/2015 01:19 12/03/2015 19:12 Full Code [id number]  Larey Dresser, MD Inpatient   10/10/2015 06:52 10/14/2015 18:57 Full Code [id number]  Phillips Grout, MD Inpatient   07/06/2015 16:27 07/12/2015 22:41 Full Code [id number]  Lavina Hamman, MD Inpatient        IV Access:    Peripheral IV   Procedures and diagnostic studies:   No results found.   Medical Consultants:    None.  Anti-Infectives:  None  Subjective:  She is complaining of left hand numbness since she started HD.  She report improved pain of heel.    Objective:    Vitals:   01/14/17 2122 01/14/17 2243 01/15/17 0644 01/15/17 1200  BP: (!) 111/48 (!) 103/52 100/67 (!) 108/48  Pulse: 68 63 (!) 58 (!) 59  Resp: 18  18 18   Temp: 99 F (37.2 C)  98.2 F (36.8 C) 98.3 F (36.8 C)  TempSrc: Oral  Oral Oral  SpO2: 98% 98% 100% 100%  Weight:   83.1 kg (183 lb 1.6 oz)   Height:        Intake/Output Summary (Last 24 hours) at 01/15/2017 1713 Last data filed at 01/15/2017 1700 Gross per 24 hour  Intake 512.67 ml  Output 0 ml  Net 512.67 ml   Filed Weights   01/14/17 0717 01/14/17 1126 01/15/17 0644  Weight: 85.4 kg (188 lb 4.4 oz) 83.3 kg (183 lb 10.3 oz) 83.1 kg (183 lb 1.6 oz)    Exam: General exam; NAD Respiratory system: CTA Cardiovascular system: S 1, S 2  RRR Gastrointestinal system: BS present, soft, nt Central nervous system: non focal.  Extremities: trace edema Skin: mild redness right heel     Data Reviewed:    Labs: Basic Metabolic Panel: Recent Labs  Lab 01/11/17 0347 01/12/17 0503 01/13/17 0936 01/14/17 0809 01/15/17 1056  NA 136 136 134* 132* 130*  K 3.8 4.1 3.9 3.9 3.9  CL 101 100* 98* 96* 96*  CO2 27 28 26 27 24   GLUCOSE 125* 111* 191* 159* 148*  BUN 33* 20 31* 21* 21*  CREATININE 4.03* 3.36* 4.87* 3.73* 3.71*  CALCIUM 8.7* 8.6* 9.0 8.6* 8.9  PHOS 4.2 2.8 4.1 3.0 3.3   GFR Estimated Creatinine Clearance: 15.5 mL/min (A) (by C-G formula based on SCr of 3.71 mg/dL (H)). Liver Function Tests: Recent Labs  Lab 01/11/17 0347 01/12/17 0503 01/13/17 0936 01/14/17 0809 01/15/17 1056  ALBUMIN 2.4* 2.5* 2.7* 2.6* 2.9*   No results for input(s): LIPASE, AMYLASE in the last 168 hours. No results for input(s): AMMONIA in the last 168 hours. Coagulation profile Recent Labs  Lab 01/11/17 0347 01/12/17 0503  01/13/17 1217 01/14/17 0809 01/15/17 1056  INR 1.98 1.54 1.40 1.26 1.19    CBC: Recent Labs  Lab 01/11/17 1647 01/12/17 0503 01/13/17 1217 01/14/17 0809 01/15/17 1056  WBC 3.5* 3.9* 4.0 3.9* 4.0  HGB 9.3* 9.2* 9.2* 8.9* 10.0*  HCT 31.5* 31.2* 31.5* 30.6* 33.9*  MCV 98.1 99.7 99.4 98.4 97.4  PLT 131* 160 165 166 131*   Cardiac Enzymes: No results for input(s): CKTOTAL, CKMB, CKMBINDEX, TROPONINI in the last 168 hours. BNP (last 3 results) No results for input(s): PROBNP in the last 8760 hours. CBG: Recent Labs  Lab 01/14/17 1635 01/14/17 2208 01/15/17 0741 01/15/17 1157 01/15/17 1617  GLUCAP 189* 194* 152* 159* 180*   D-Dimer: No results for input(s): DDIMER in the last 72 hours. Hgb A1c: No results for input(s): HGBA1C in the last 72 hours. Lipid Profile: No results for input(s): CHOL, HDL, LDLCALC, TRIG, CHOLHDL, LDLDIRECT in the last 72 hours. Thyroid function studies: No results  for input(s): TSH, T4TOTAL, T3FREE, THYROIDAB in the last 72 hours.  Invalid input(s): FREET3 Anemia work up: No results for input(s): VITAMINB12, FOLATE, FERRITIN, TIBC, IRON, RETICCTPCT in the last 72 hours. Sepsis Labs: Recent Labs  Lab 01/12/17 0503 01/13/17 1217 01/14/17 0809 01/15/17 1056  WBC 3.9* 4.0 3.9* 4.0   Microbiology No results found for this or any previous visit (from the past 240 hour(s)).   Medications:   . ALPRAZolam  0.5 mg Oral QHS  . amiodarone  200 mg Oral Daily  . calcitRIOL  0.5 mcg Oral Daily  . cyclobenzaprine  5 mg Oral QHS  . darbepoetin (ARANESP) injection - NON-DIALYSIS  150 mcg Subcutaneous Q Mon-1800  . diclofenac sodium  2 g Topical BID  . fluticasone  1 spray Each Nare Daily  . insulin aspart  0-15 Units Subcutaneous TID WC  . insulin glargine  15 Units Subcutaneous QHS  . isosorbide mononitrate  30 mg Oral BID  . latanoprost  1 drop Both Eyes QHS  . loratadine  10 mg Oral Daily  . meclizine  12.5 mg Oral BID  . mouth rinse  15 mL Mouth Rinse BID  . metoprolol succinate  25 mg Oral QHS  . pantoprazole  40 mg Oral Daily  . rosuvastatin  40 mg Oral q1800  . senna-docusate  2 tablet Oral BID  . sodium chloride flush  3 mL Intravenous Q12H   Continuous Infusions: . sodium chloride    . sodium chloride 10 mL/hr at 01/14/17 0200  . heparin 1,000 Units/hr (01/15/17 1557)      LOS: 23 days   Niel Hummer A  [facility] Pager [phone]    01/15/2017, 5:13 PM

## 2017-01-15 NOTE — Progress Notes (Signed)
ANTICOAGULATION CONSULT NOTE  Pharmacy Consult: Heparin Indication: atrial fibrillation  Allergies  Allergen Reactions  . Ace Inhibitors Cough  . Amlodipine Swelling    UNSPECIFIED EDEMA   . Codeine Rash    Patient Measurements: Height: 5\' 8"  (172.7 cm) Weight: 183 lb 1.6 oz (83.1 kg) IBW/kg (Calculated) : 63.9  Vital Signs: Temp: 98.2 F (36.8 C) (11/11 0644) Temp Source: Oral (11/11 0644) BP: 100/67 (11/11 0644) Pulse Rate: 58 (11/11 0644)  Labs: Recent Labs    01/13/17 0936  01/13/17 1217 01/14/17 0809 01/15/17 1056  HGB  --    < > 9.2* 8.9* 10.0*  HCT  --   --  31.5* 30.6* 33.9*  PLT  --   --  165 166 131*  LABPROT  --   --  17.1* 15.7* 15.0  INR  --   --  1.40 1.26 1.19  HEPARINUNFRC  --   --  0.93* 0.44 0.36  CREATININE 4.87*  --   --  3.73* 3.71*   < > = values in this interval not displayed.    Assessment: 8 YOF with history of Afib on Coumadin PTA.  Patient was transitioned to heparin bridge for HD catheter placement, currently planned for Monday.   -heparin level = 0.36 and at goal, CBC stable   Goal of Therapy:  Heparin level 0.3-0.7 units/ml Monitor platelets by anticoagulation protocol: Yes    Plan:  Continue heparin gtt at 1000 units/hr Daily heparin level and CBC  Hildred Laser, Pharm D 01/15/2017 12:33 PM

## 2017-01-16 ENCOUNTER — Inpatient Hospital Stay (HOSPITAL_COMMUNITY): Payer: BLUE CROSS/BLUE SHIELD

## 2017-01-16 ENCOUNTER — Encounter (HOSPITAL_COMMUNITY): Payer: Self-pay | Admitting: Interventional Radiology

## 2017-01-16 DIAGNOSIS — L8990 Pressure ulcer of unspecified site, unspecified stage: Secondary | ICD-10-CM

## 2017-01-16 HISTORY — PX: IR FLUORO GUIDE CV LINE RIGHT: IMG2283

## 2017-01-16 HISTORY — PX: IR US GUIDE VASC ACCESS RIGHT: IMG2390

## 2017-01-16 LAB — GLUCOSE, CAPILLARY
GLUCOSE-CAPILLARY: 214 mg/dL — AB (ref 65–99)
Glucose-Capillary: 98 mg/dL (ref 65–99)
Glucose-Capillary: 79 mg/dL (ref 65–99)
Glucose-Capillary: 150 mg/dL — ABNORMAL HIGH (ref 65–99)

## 2017-01-16 LAB — RENAL FUNCTION PANEL
Albumin: 2.5 g/dL — ABNORMAL LOW (ref 3.5–5.0)
Anion gap: 8 (ref 5–15)
BUN: 28 mg/dL — AB (ref 6–20)
CHLORIDE: 97 mmol/L — AB (ref 101–111)
CO2: 26 mmol/L (ref 22–32)
CREATININE: 4.78 mg/dL — AB (ref 0.44–1.00)
Calcium: 8.7 mg/dL — ABNORMAL LOW (ref 8.9–10.3)
GFR calc Af Amer: 10 mL/min — ABNORMAL LOW (ref >60)
GFR calc non Af Amer: 8 mL/min — ABNORMAL LOW (ref >60)
GLUCOSE: 230 mg/dL — AB (ref 65–99)
POTASSIUM: 3.9 mmol/L (ref 3.5–5.1)
Phosphorus: 4.3 mg/dL (ref 2.5–4.6)
Sodium: 131 mmol/L — ABNORMAL LOW (ref 135–145)

## 2017-01-16 LAB — CBC
HCT: 30.5 % — ABNORMAL LOW (ref 36.0–46.0)
Hemoglobin: 8.9 g/dL — ABNORMAL LOW (ref 12.0–15.0)
MCH: 28.4 pg (ref 26.0–34.0)
MCHC: 29.2 g/dL — AB (ref 30.0–36.0)
MCV: 97.4 fL (ref 78.0–100.0)
PLATELETS: 132 10*3/uL — AB (ref 150–400)
RBC: 3.13 MIL/uL — AB (ref 3.87–5.11)
RDW: 19.5 % — ABNORMAL HIGH (ref 11.5–15.5)
WBC: 3.7 10*3/uL — ABNORMAL LOW (ref 4.0–10.5)

## 2017-01-16 LAB — HEPARIN LEVEL (UNFRACTIONATED): Heparin Unfractionated: 0.4 IU/mL (ref 0.30–0.70)

## 2017-01-16 LAB — PROTIME-INR
INR: 1.21
PROTHROMBIN TIME: 15.2 s (ref 11.4–15.2)

## 2017-01-16 MED ORDER — CHLORHEXIDINE GLUCONATE 4 % EX LIQD
CUTANEOUS | Status: AC
  Filled 2017-01-16: qty 15

## 2017-01-16 MED ORDER — HEPARIN SODIUM (PORCINE) 1000 UNIT/ML IJ SOLN
INTRAMUSCULAR | Status: AC
  Filled 2017-01-16: qty 1

## 2017-01-16 MED ORDER — CEFAZOLIN (ANCEF) 1 G IV SOLR: 2.0000 g | INTRAVENOUS | Status: DC

## 2017-01-16 MED ORDER — FENTANYL CITRATE (PF) 100 MCG/2ML IJ SOLN
INTRAMUSCULAR | Status: AC | PRN
  Administered 2017-01-16: 50 ug via INTRAVENOUS
  Administered 2017-01-16: 25 ug via INTRAVENOUS

## 2017-01-16 MED ORDER — CEFAZOLIN SODIUM-DEXTROSE 2-4 GM/100ML-% IV SOLN
2.0000 g | Freq: Once | INTRAVENOUS | Status: AC
  Administered 2017-01-16: 2 g via INTRAVENOUS

## 2017-01-16 MED ORDER — MIDAZOLAM HCL 2 MG/2ML IJ SOLN
INTRAMUSCULAR | Status: AC | PRN
  Administered 2017-01-16 (×2): 1 mg via INTRAVENOUS

## 2017-01-16 MED ORDER — MIDAZOLAM HCL 2 MG/2ML IJ SOLN
INTRAMUSCULAR | Status: AC
  Filled 2017-01-16: qty 4

## 2017-01-16 MED ORDER — WARFARIN - PHARMACIST DOSING INPATIENT
Freq: Every day | Status: DC
  Administered 2017-01-17: 17:00:00

## 2017-01-16 MED ORDER — HEPARIN (PORCINE) IN NACL 100-0.45 UNIT/ML-% IJ SOLN
1000.0000 [IU]/h | INTRAMUSCULAR | Status: DC
  Administered 2017-01-16: 1000 [IU]/h via INTRAVENOUS
  Filled 2017-01-16: qty 250

## 2017-01-16 MED ORDER — LIDOCAINE HCL 1 % IJ SOLN
INTRAMUSCULAR | Status: AC
  Filled 2017-01-16: qty 20

## 2017-01-16 MED ORDER — LIDOCAINE HCL 1 % IJ SOLN
INTRAMUSCULAR | Status: AC | PRN
  Administered 2017-01-16: 15 mL

## 2017-01-16 MED ORDER — CLOPIDOGREL BISULFATE 75 MG PO TABS
75.0000 mg | ORAL_TABLET | Freq: Every day | ORAL | Status: DC
  Administered 2017-01-17: 75 mg via ORAL
  Filled 2017-01-16: qty 1

## 2017-01-16 MED ORDER — CEFAZOLIN SODIUM-DEXTROSE 2-4 GM/100ML-% IV SOLN
INTRAVENOUS | Status: AC
  Filled 2017-01-16: qty 100

## 2017-01-16 MED ORDER — FENTANYL CITRATE (PF) 100 MCG/2ML IJ SOLN
INTRAMUSCULAR | Status: AC
  Filled 2017-01-16: qty 4

## 2017-01-16 NOTE — Progress Notes (Signed)
[facility] PROGRESS NOTE    Progress Note  [name]  BZJ:[id number] DOB: 09-20-1945 DOA: 12/23/2016 PCP: [name], MD     Brief Narrative:   [name] is an 71 y.o. female with medical history significant for insulin-dependent diabetes mellitus, chronic kidney disease stage IV, chronic combined systolic/diastolic CHF, chronic atrial fibrillation, and coronary artery disease, presented to the ED at the direction of her nephrologist for evaluation of swelling and fatigue. She has been followed by cardiology and nephrology for worsening fluid status complicating worsening kidney function. She continued to be volume overloaded despite increase in her diuretics recently. Admitted with acute on chronic diastolic heart failure secondary to Acute on chronic renal failure.  Nephrology and cardiology following.  Unable to manage volume status despite aggressive IV diuresis.  Now deemed ESRD.  Temporary right IJ HD catheter placed and HD started on 01/07/17.     Assessment/Plan:   Principal Problem:   CKD (chronic kidney disease), stage IV (HCC) Active Problems:   Essential hypertension   Coronary artery disease   Acute on chronic combined systolic and diastolic CHF (congestive heart failure) (HCC)   Elevated troponin   DM type 2 causing vascular disease (HCC)   Pleural effusion on right   Anemia in chronic kidney disease   Atrial fibrillation (HCC)   Hypokalemia   Hypervolemia   Rhonchi   Pressure injury of skin  Acute on chronic combined systolic and diastolic decompensated heart failure: - 2D echo in November 11, 2016 showed an EF of 30% with grade 2 diastolic heart failure. - On admission cardiology and nephrology were consulted, despite extensive efforts to manage his diuresis patient did not make status viable progress diuresing. - After extensive discussion with family patient agreed to proceed with hemodialysis which was started on 01/07/2017 - volume  manage with HD  Acute kidney injury superimposed on chronic renal disease stage IV leading to stage V now on hemodialysis: - Underwent AV graft placement on 12/30/2016. - Nephrology initiated dialysis on 01/07/2017, he has had 4 days of HD. - Appreciate nephrology's assistance, they will proceed with tunneled catheter before DC home. -need tunnel catheter prior to discharge. For tunneling catheter today.  Resume plavix and coumadin if ok by IR post procedure.   Paroxysmal atrial fibrillation: - Cardiology was consulted they recommended to continue amiodarone, metoprolol, Plavix. - Coumadin was held we are to proceed with tunneled catheter. - INR 1.5---resume coumadin when ok by IR  Continue with heparin.   Coronary artery disease: - Asymptomatic continue with  mdur metoprolol and statins. -hold Plavix., for tunnel catheter  Diabetes mellitus type 2: - Uncontrolled with mild fluctuations continue current regimen, long-acting insulin plus sliding scale. -hypoglycemia-- will decrease lantus to 15 units   Hypokalemia: - To be repleated during HD.  Anemia chronic disease: - Seems to be stable continue iron supplementation.  Leukopenia: - Of unclear etiology follow-up with outpatient PCP. -mildly decreased.   Left hand numbness; recent AV graft.  Stable. Evaluated by Vascular.   Left  heel pain; mild redness.  Voltaren PRN.   Disposition: Patient was evaluated by physical therapy and occupational therapy they recommended skilled nursing facility, the patient cannot be discharged to skilled nursing facility once tunneled catheter has been placed and used. Patient would like to go home at time of discharge  DVT prophylaxis: coumadin Family Communication:None Disposition Plan/Barrier to D/C: home in 3-4 days Code Status:     Code Status Orders  (From admission, onward)  Start     Ordered   12/23/16 2108  Full code  Continuous     12/23/16 2119    Code Status  History    Date Active Date Inactive Code Status Order ID Comments User Context   12/03/2016 11:29 12/12/2016 21:34 Full Code [id number]  Radene Gunning, NP Inpatient   11/21/2016 13:06 11/27/2016 22:43 Full Code [id number]  Annita Brod, MD Inpatient   11/10/2016 23:59 11/16/2016 20:25 Full Code [id number]  Phillips Grout, MD Inpatient   10/31/2016 17:15 11/01/2016 17:52 Full Code [id number]  Erline Hau, MD Inpatient   08/07/2016 15:05 08/08/2016 20:44 Full Code [id number]  Oswald Hillock, MD Inpatient   07/14/2016 09:55 07/16/2016 19:09 Full Code [id number]  Albertine Gabby, MD Inpatient   07/09/2016 08:54 07/12/2016 19:16 Full Code [id number]  Oswald Hillock, MD Inpatient   12/01/2015 01:19 12/03/2015 19:12 Full Code [id number]  Larey Dresser, MD Inpatient   10/10/2015 06:52 10/14/2015 18:57 Full Code [id number]  Phillips Grout, MD Inpatient   07/06/2015 16:27 07/12/2015 22:41 Full Code [id number]  Lavina Hamman, MD Inpatient        IV Access:    Peripheral IV   Procedures and diagnostic studies:   No results found.   Medical Consultants:    None.  Anti-Infectives:  None  Subjective:  No new complaint, awaiting procedure   Objective:    Vitals:   01/15/17 2113 01/16/17 0500 01/16/17 0510 01/16/17 0750  BP: (!) 108/45  107/65 (!) 119/47  Pulse: 60  (!) 58 (!) 59  Resp:   20 18  Temp:   98.3 F (36.8 C) 98 F (36.7 C)  TempSrc:   Oral Oral  SpO2:   95% 100%  Weight:  85.1 kg (187 lb 9.6 oz)    Height:        Intake/Output Summary (Last 24 hours) at 01/16/2017 1114 Last data filed at 01/16/2017 0300 Gross per 24 hour  Intake 560 ml  Output -  Net 560 ml   Filed Weights   01/14/17 1126 01/15/17 0644 01/16/17 0500  Weight: 83.3 kg (183 lb 10.3 oz) 83.1 kg (183 lb 1.6 oz) 85.1 kg (187 lb 9.6 oz)    Exam: General exam; NAD Respiratory system: CTA Cardiovascular system: S 1, S 2 RRR Gastrointestinal system: BS present, soft. nt Central nervous system: non  focal.  Extremities: trace edema Skin: mild redness right heel     Data Reviewed:    Labs: Basic Metabolic Panel: Recent Labs  Lab 01/12/17 0503 01/13/17 0936 01/14/17 0809 01/15/17 1056 01/16/17 0435  NA 136 134* 132* 130* 131*  K 4.1 3.9 3.9 3.9 3.9  CL 100* 98* 96* 96* 97*  CO2 28 26 27 24 26   GLUCOSE 111* 191* 159* 148* 230*  BUN 20 31* 21* 21* 28*  CREATININE 3.36* 4.87* 3.73* 3.71* 4.78*  CALCIUM 8.6* 9.0 8.6* 8.9 8.7*  PHOS 2.8 4.1 3.0 3.3 4.3   GFR Estimated Creatinine Clearance: 12.2 mL/min (A) (by C-G formula based on SCr of 4.78 mg/dL (H)). Liver Function Tests: Recent Labs  Lab 01/12/17 0503 01/13/17 0936 01/14/17 0809 01/15/17 1056 01/16/17 0435  ALBUMIN 2.5* 2.7* 2.6* 2.9* 2.5*   No results for input(s): LIPASE, AMYLASE in the last 168 hours. No results for input(s): AMMONIA in the last 168 hours. Coagulation profile Recent Labs  Lab 01/12/17 0503 01/13/17 1217 01/14/17 0809 01/15/17 1056 01/16/17 0435  INR 1.54 1.40 1.26 1.19 1.21  CBC: Recent Labs  Lab 01/12/17 0503 01/13/17 1217 01/14/17 0809 01/15/17 1056 01/16/17 0435  WBC 3.9* 4.0 3.9* 4.0 3.7*  HGB 9.2* 9.2* 8.9* 10.0* 8.9*  HCT 31.2* 31.5* 30.6* 33.9* 30.5*  MCV 99.7 99.4 98.4 97.4 97.4  PLT 160 165 166 131* 132*   Cardiac Enzymes: No results for input(s): CKTOTAL, CKMB, CKMBINDEX, TROPONINI in the last 168 hours. BNP (last 3 results) No results for input(s): PROBNP in the last 8760 hours. CBG: Recent Labs  Lab 01/15/17 0741 01/15/17 1157 01/15/17 1617 01/15/17 2106 01/16/17 0755  GLUCAP 152* 159* 180* 265* 214*   D-Dimer: No results for input(s): DDIMER in the last 72 hours. Hgb A1c: No results for input(s): HGBA1C in the last 72 hours. Lipid Profile: No results for input(s): CHOL, HDL, LDLCALC, TRIG, CHOLHDL, LDLDIRECT in the last 72 hours. Thyroid function studies: No results for input(s): TSH, T4TOTAL, T3FREE, THYROIDAB in the last 72 hours.  Invalid  input(s): FREET3 Anemia work up: No results for input(s): VITAMINB12, FOLATE, FERRITIN, TIBC, IRON, RETICCTPCT in the last 72 hours. Sepsis Labs: Recent Labs  Lab 01/13/17 1217 01/14/17 0809 01/15/17 1056 01/16/17 0435  WBC 4.0 3.9* 4.0 3.7*   Microbiology No results found for this or any previous visit (from the past 240 hour(s)).   Medications:   . ALPRAZolam  0.5 mg Oral QHS  . amiodarone  200 mg Oral Daily  . calcitRIOL  0.5 mcg Oral Daily  . cyclobenzaprine  5 mg Oral QHS  . darbepoetin (ARANESP) injection - NON-DIALYSIS  150 mcg Subcutaneous Q Mon-1800  . diclofenac sodium  2 g Topical BID  . fluticasone  1 spray Each Nare Daily  . insulin aspart  0-15 Units Subcutaneous TID WC  . insulin glargine  15 Units Subcutaneous QHS  . isosorbide mononitrate  30 mg Oral BID  . latanoprost  1 drop Both Eyes QHS  . loratadine  10 mg Oral Daily  . meclizine  12.5 mg Oral BID  . mouth rinse  15 mL Mouth Rinse BID  . metoprolol succinate  25 mg Oral QHS  . pantoprazole  40 mg Oral Daily  . rosuvastatin  40 mg Oral q1800  . senna-docusate  2 tablet Oral BID  . sodium chloride flush  3 mL Intravenous Q12H   Continuous Infusions: . sodium chloride    . sodium chloride 10 mL/hr at 01/14/17 0200  . heparin Stopped (01/16/17 0909)      LOS: 24 days   Niel Hummer A  [facility] Pager [phone]    01/16/2017, 11:14 AM

## 2017-01-16 NOTE — Progress Notes (Signed)
Pt connected to dynamap for frequent vital signs post op. Report given to [facility]

## 2017-01-16 NOTE — Progress Notes (Signed)
IR called and stated to turn off heparin drip at 0900  Heparin to be off for 3 hours prior to surgery

## 2017-01-16 NOTE — Progress Notes (Signed)
ANTICOAGULATION CONSULT NOTE  Pharmacy Consult: Heparin Indication: atrial fibrillation  Allergies  Allergen Reactions  . Ace Inhibitors Cough  . Amlodipine Swelling    UNSPECIFIED EDEMA   . Codeine Rash   Patient Measurements: Height: 5\' 8"  (172.7 cm) Weight: 187 lb 9.6 oz (85.1 kg) IBW/kg (Calculated) : 63.9  Vital Signs: Temp: 98 F (36.7 C) (11/12 0750) Temp Source: Oral (11/12 0750) BP: 119/47 (11/12 0750) Pulse Rate: 59 (11/12 0750)  Labs: Recent Labs    01/14/17 0809 01/15/17 1056 01/16/17 0435  HGB 8.9* 10.0* 8.9*  HCT 30.6* 33.9* 30.5*  PLT 166 131* 132*  LABPROT 15.7* 15.0 15.2  INR 1.26 1.19 1.21  HEPARINUNFRC 0.44 0.36 0.40  CREATININE 3.73* 3.71* 4.78*   Assessment: 22 YOF with history of Afib on Coumadin PTA.  Patient was transitioned to heparin bridge for HD catheter placement, currently planned for Monday 11/12.    Heparin level therapeutic: 0.40 CBC stable, HgB 8.9, PLT 132 No overt bleeding documented INR: 1.21, warfarin remains on hold at this time  Goal of Therapy:  Heparin level 0.3-0.7 units/ml Monitor platelets by anticoagulation protocol: Yes    Plan:  Continue heparin gtt at 1000 units/hr Daily heparin level and CBC F/u resuming warfarin and clopidogrel post catheter placement  Georga Bora, PharmD Clinical Pharmacist 01/16/2017 8:28 AM

## 2017-01-16 NOTE — Progress Notes (Signed)
Physical Therapy Treatment Patient Details Name: [name] MRN: [id number] DOB: 1945/04/29 Today's Date: 01/16/2017    History of Present Illness Pt is a 71 y.o. female PMH of CAD (s/p NSTEMI in 06/2015, subtotal occlusion of LCX treated medically; repeat cath 11/2015 with stable disease), chronic combined diastolic CHF (EF 41-32% by echo in 11/2016), paroxysmal a-fib, R subclavian stenosis (s/p intervention), IDDM, HTN, HLD, and CKD IV admitted on 12/23/2016 for worsening edema and fatigue. Pt being treated for acute on chronic combined systolic and diastolic CHF, acute on chronic Stage 4 CKD, paroxysmal A-fib, CAD, anemia, hypoprothrobinemia, and IDDM. Now s/p temporary R IJ HD cath placement 11/3. Tentative plan for tunnelled R IJ cath placement on Monday 11/12.     PT Comments    Pt progressing towards physical therapy goals. Was able to perform transfers and ambulation with gross min guard to min assist for balance support and safety. 1 seated rest break required due to fatigue, however overall pt tolerated increase in ambulation distance well with no complaints of pain or SOB. VSS throughout treatment. Will continue to follow and progress as able per POC.    Follow Up Recommendations  SNF;Supervision for mobility/OOB     Equipment Recommendations  None recommended by PT    Recommendations for Other Services OT consult     Precautions / Restrictions Precautions Precautions: Fall Restrictions Weight Bearing Restrictions: No    Mobility  Bed Mobility Overal bed mobility: Needs Assistance Bed Mobility: Rolling;Sit to Sidelying Rolling: Supervision Sidelying to sit: Min guard       General bed mobility comments: No physical assistance required to transition to EOB. Increased time to complete scooting activity due to reported lightheadedness.   Transfers Overall transfer level: Needs assistance Equipment used: Rolling walker (2 wheeled) Transfers: Sit to/from  Stand Sit to Stand: Min assist         General transfer comment: Assist to power-up to full standing position. Pt was cued for hand placement on seated surface for safety.   Ambulation/Gait Ambulation/Gait assistance: Min guard Ambulation Distance (Feet): 200 Feet Assistive device: Rolling walker (2 wheeled) Gait Pattern/deviations: Step-through pattern;Decreased stride length;Trunk flexed Gait velocity: decreased Gait velocity interpretation: <1.8 ft/sec, indicative of risk for recurrent falls General Gait Details: Slow but generally steady with RW and min guard for safety (+2 with chair follow); pt requiring 1x seated rest break secondary to fatigue.   Stairs            Wheelchair Mobility    Modified Rankin (Stroke Patients Only)       Balance Overall balance assessment: Needs assistance Sitting-balance support: No upper extremity supported;Feet supported Sitting balance-[name]: Good     Standing balance support: Bilateral upper extremity supported;Single extremity supported Standing balance-[name]: Poor Standing balance comment: Reliant on UE support                            Cognition Arousal/Alertness: Awake/alert Behavior During Therapy: WFL for tasks assessed/performed Overall Cognitive Status: Within Functional Limits for tasks assessed                                        Exercises      General Comments        Pertinent Vitals/Pain Pain Assessment: Faces Faces Pain Scale: Hurts little more Pain Location: Chronic back pain Pain Descriptors /  Indicators: Sore Pain Intervention(s): Monitored during session;Repositioned    Home Living                      Prior Function            PT Goals (current goals can now be found in the care plan section) Acute Rehab PT Goals Patient Stated Goal: Return home PT Goal Formulation: With patient Time For Goal Achievement: 01/26/17 Potential to Achieve  Goals: Good Progress towards PT goals: Progressing toward goals    Frequency    Min 3X/week      PT Plan Current plan remains appropriate    Co-evaluation              AM-PAC PT "6 Clicks" Daily Activity  Outcome Measure  Difficulty turning over in bed (including adjusting bedclothes, sheets and blankets)?: Unable Difficulty moving from lying on back to sitting on the side of the bed? : Unable Difficulty sitting down on and standing up from a chair with arms (e.g., wheelchair, bedside commode, etc,.)?: A Little Help needed moving to and from a bed to chair (including a wheelchair)?: A Little Help needed walking in hospital room?: A Little Help needed climbing 3-5 steps with a railing? : A Lot 6 Click Score: 13    End of Session Equipment Utilized During Treatment: Gait belt Activity Tolerance: Patient tolerated treatment well Patient left: in chair;with call bell/phone within reach Nurse Communication: Mobility status PT Visit Diagnosis: Unsteadiness on feet (R26.81);Other abnormalities of gait and mobility (R26.89);Muscle weakness (generalized) (M62.81)     Time: 8832-5498 PT Time Calculation (min) (ACUTE ONLY): 24 min  Charges:  $Gait Training: 23-37 mins                    G Codes:       Rolinda Roan, PT, DPT Acute Rehabilitation Services Pager: [phone]    Thelma Comp 01/16/2017, 11:44 AM

## 2017-01-16 NOTE — Progress Notes (Signed)
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  Subjective:  Patient has [place], Tuesdays Thursdays and Saturdays. She is willing to start on this schedule their wishes to have 1 days, Wednesdays, Fridays. Plan is for tunnel dialysis catheter today. Previously had left upper extremity graft, vascular surgery following for some paresthesias in the hand. Last dialysis was on Saturday, next due on Tuesday.  11/11 0701 - 11/12 0700 In: 560 [P.O.:360; I.V.:200] Out: -   Filed Weights   01/14/17 1126 01/15/17 0644 01/16/17 0500  Weight: 83.3 kg (183 lb 10.3 oz) 83.1 kg (183 lb 1.6 oz) 85.1 kg (187 lb 9.6 oz)    Scheduled Meds: . ALPRAZolam  0.5 mg Oral QHS  . amiodarone  200 mg Oral Daily  . calcitRIOL  0.5 mcg Oral Daily  . cyclobenzaprine  5 mg Oral QHS  . darbepoetin (ARANESP) injection - NON-DIALYSIS  150 mcg Subcutaneous Q Mon-1800  . diclofenac sodium  2 g Topical BID  . fluticasone  1 spray Each Nare Daily  . insulin aspart  0-15 Units Subcutaneous TID WC  . insulin glargine  15 Units Subcutaneous QHS  . isosorbide mononitrate  30 mg Oral BID  . latanoprost  1 drop Both Eyes QHS  . loratadine  10 mg Oral Daily  . meclizine  12.5 mg Oral BID  . mouth rinse  15 mL Mouth Rinse BID  . metoprolol succinate  25 mg Oral QHS  . pantoprazole  40 mg Oral Daily  . rosuvastatin  40 mg Oral q1800  . senna-docusate  2 tablet Oral BID  . sodium chloride flush  3 mL Intravenous Q12H   Continuous Infusions: . sodium chloride    . sodium chloride 10 mL/hr at 01/14/17 0200  . heparin Stopped (01/16/17 0909)   PRN Meds:.sodium chloride, acetaminophen, albuterol, diazepam, fentaNYL (SUBLIMAZE) injection, fentaNYL (SUBLIMAZE) injection, guaiFENesin-dextromethorphan, ondansetron (ZOFRAN) IV, sodium chloride flush, sodium chloride flush  Current Labs: reviewed    Physical Exam:  Blood pressure (!) 119/47, pulse (!) 59, temperature 98  F (36.7 C), temperature source Oral, resp. rate 18, height 5\' 8"  (1.727 m), weight 85.1 kg (187 lb 9.6 oz), SpO2 100 %. NAD, sitting in chair RRR no rub, nl s1s2 LUE AVG +B/T No LEE CTAB Nonfocal  A 1. New ESRD, CLIP to [facility] 2. Vascular Access: for [facility] 11/12; s/p LUE AVG 12/30/16 3. CAD 4. pAF on warfarin  5. CHF exacerbation 6. Anemia; Aranesp 150 qMon; last TSAT 66% 7. HTN/Vol 8. CKD BMD: P and Ca at goal; 10/23 PTH 270, at goal; on C3 (do not continue calcitriol at discharge, will be adminstered at HD unit)  P 1. TDC today 2. HD tomorrow if still here, or can rec HD at [facility] unit: 3K 2.5Ca, use TDC, 4h, 2-3L UF, no heparin   Pearson Grippe MD 01/16/2017, 12:20 PM  Recent Labs  Lab 01/14/17 0809 01/15/17 1056 01/16/17 0435  NA 132* 130* 131*  K 3.9 3.9 3.9  CL 96* 96* 97*  CO2 27 24 26   GLUCOSE 159* 148* 230*  BUN 21* 21* 28*  CREATININE 3.73* 3.71* 4.78*  CALCIUM 8.6* 8.9 8.7*  PHOS 3.0 3.3 4.3   Recent Labs  Lab 01/14/17 0809 01/15/17 1056 01/16/17 0435  WBC 3.9* 4.0 3.7*  HGB 8.9* 10.0* 8.9*  HCT 30.6* 33.9* 30.5*  MCV 98.4 97.4 97.4  PLT 166 131* 132*

## 2017-01-16 NOTE — Progress Notes (Signed)
Pharmacist was called regarding whether or not to resume heparin drip patient was on prior to [facility] placement. There is a page out to the doctor and pharmacist will let me know once clarification has been made with MD.

## 2017-01-16 NOTE — Procedures (Signed)
Interventional Radiology Procedure Note  Procedure: Placement of right IJ tunneled HD catheter  Complications: None  Estimated Blood Loss: < 10 mL  23 cm tip to cuff length Palindrome HD catheter placed with tip in RA.    Venetia Night. Kathlene Cote, M.D Pager:  [phone]

## 2017-01-16 NOTE — Progress Notes (Signed)
ANTICOAGULATION CONSULT NOTE - Follow Up Consult  Pharmacy Consult for Heparin and Coumadin Indication: atrial fibrillation  Allergies  Allergen Reactions  . Ace Inhibitors Cough  . Amlodipine Swelling    UNSPECIFIED EDEMA   . Codeine Rash    Patient Measurements: Height: 5\' 8"  (172.7 cm) Weight: 187 lb 9.6 oz (85.1 kg) IBW/kg (Calculated) : 63.9 Heparin Dosing Weight:  85 kg  Vital Signs: Temp: 98 F (36.7 C) (11/12 0750) Temp Source: Oral (11/12 0750) BP: 124/49 (11/12 1516) Pulse Rate: 63 (11/12 1516)  Labs: Recent Labs    01/14/17 0809 01/15/17 1056 01/16/17 0435  HGB 8.9* 10.0* 8.9*  HCT 30.6* 33.9* 30.5*  PLT 166 131* 132*  LABPROT 15.7* 15.0 15.2  INR 1.26 1.19 1.21  HEPARINUNFRC 0.44 0.36 0.40  CREATININE 3.73* 3.71* 4.78*   ESRD   Assessment: 14 YOF with history of Afib on Coumadin PTA.  Patient was transitioned to heparin bridge for HD catheter placement, which was done this afternoon.   Heparin level therapeutic (0.40) this morning on 1000 units/hr. INR 1.21.  Heparin drip held at 9am today for procedure.      Spoke with Dr. Kathlene Cote.  Ok to resume heparin drip now/tonight.   He would like to wait until tomorrow to resume Coumadin and Plavix.  Goal of Therapy:  INR 2-3 Heparin level 0.3-0.7 units/ml Monitor platelets by anticoagulation protocol: Yes   Plan:   Resume heparin drip at 1000 units/hr.  Heparin level, PT/INR and CBC in am.  Will resume Coumadin on 11/14 per IR.  Plavix to resume on 11/14 per IR.  Arty Baumgartner, [place] Pager: [phone] 01/16/2017,7:31 PM

## 2017-01-17 ENCOUNTER — Telehealth: Payer: Self-pay | Admitting: Vascular Surgery

## 2017-01-17 ENCOUNTER — Telehealth: Payer: Self-pay | Admitting: Cardiovascular Disease

## 2017-01-17 LAB — CBC
HEMATOCRIT: 31.3 % — AB (ref 36.0–46.0)
Hemoglobin: 9.3 g/dL — ABNORMAL LOW (ref 12.0–15.0)
MCH: 29 pg (ref 26.0–34.0)
MCHC: 29.7 g/dL — ABNORMAL LOW (ref 30.0–36.0)
MCV: 97.5 fL (ref 78.0–100.0)
PLATELETS: 138 10*3/uL — AB (ref 150–400)
RBC: 3.21 MIL/uL — ABNORMAL LOW (ref 3.87–5.11)
RDW: 20 % — AB (ref 11.5–15.5)
WBC: 3.8 10*3/uL — AB (ref 4.0–10.5)

## 2017-01-17 LAB — RENAL FUNCTION PANEL
ANION GAP: 10 (ref 5–15)
Albumin: 2.5 g/dL — ABNORMAL LOW (ref 3.5–5.0)
BUN: 35 mg/dL — ABNORMAL HIGH (ref 6–20)
CALCIUM: 9.3 mg/dL (ref 8.9–10.3)
CHLORIDE: 99 mmol/L — AB (ref 101–111)
CO2: 26 mmol/L (ref 22–32)
CREATININE: 5.3 mg/dL — AB (ref 0.44–1.00)
GFR, EST AFRICAN AMERICAN: 8 mL/min — AB (ref >60)
GFR, EST NON AFRICAN AMERICAN: 7 mL/min — AB (ref >60)
Glucose, Bld: 111 mg/dL — ABNORMAL HIGH (ref 65–99)
Phosphorus: 4.9 mg/dL — ABNORMAL HIGH (ref 2.5–4.6)
Potassium: 3.9 mmol/L (ref 3.5–5.1)
Sodium: 135 mmol/L (ref 135–145)

## 2017-01-17 LAB — GLUCOSE, CAPILLARY
GLUCOSE-CAPILLARY: 120 mg/dL — AB (ref 65–99)
GLUCOSE-CAPILLARY: 136 mg/dL — AB (ref 65–99)

## 2017-01-17 LAB — HEPARIN LEVEL (UNFRACTIONATED): HEPARIN UNFRACTIONATED: 0.33 [IU]/mL (ref 0.30–0.70)

## 2017-01-17 LAB — PROTIME-INR
INR: 1.15
Prothrombin Time: 14.6 seconds (ref 11.4–15.2)

## 2017-01-17 MED ORDER — INSULIN GLARGINE 100 UNIT/ML ~~LOC~~ SOLN: 15.0000 [IU] | Freq: Every day | SUBCUTANEOUS | 11 refills | Status: DC

## 2017-01-17 MED ORDER — WARFARIN SODIUM 7.5 MG PO TABS
7.5000 mg | ORAL_TABLET | Freq: Once | ORAL | Status: AC
  Administered 2017-01-17: 7.5 mg via ORAL
  Filled 2017-01-17: qty 1

## 2017-01-17 MED ORDER — METOPROLOL SUCCINATE ER 25 MG PO TB24: 25.0000 mg | ORAL_TABLET | Freq: Every day | ORAL | 0 refills | Status: DC

## 2017-01-17 MED ORDER — WARFARIN SODIUM 7.5 MG PO TABS: 7.5000 mg | ORAL_TABLET | Freq: Every day | ORAL | 0 refills | Status: DC

## 2017-01-17 MED ORDER — AMIODARONE HCL 200 MG PO TABS: 200.0000 mg | ORAL_TABLET | Freq: Every day | ORAL | 0 refills | Status: DC

## 2017-01-17 MED ORDER — CALCITRIOL 0.5 MCG PO CAPS: 0.5000 ug | ORAL_CAPSULE | Freq: Every day | ORAL | 0 refills | Status: AC

## 2017-01-17 MED ORDER — ISOSORBIDE MONONITRATE ER 30 MG PO TB24: 30.0000 mg | ORAL_TABLET | Freq: Two times a day (BID) | ORAL | 0 refills | Status: AC

## 2017-01-17 NOTE — Telephone Encounter (Signed)
Per D/C summary pt is being d/c from [facility] today on warfarin 7.5mg  daily and an INR of 1.1.  She is being d/c with [facility].  Called [facility] and gave them order to check INR when they admit pt on 11/14 or 11/15.

## 2017-01-17 NOTE — Progress Notes (Signed)
Instructed pt on procedure. HOB less than 45*. Pt held breath during line removal, pressure held approx 66min with pressure drsg applied. No s/sx of bleeding noted. Instructed pt to remain in bed for 30 min, leave pressure drsg CDI for 24 hours and to notify nurse of any s/sx of bleeding and to hold pressure if noted. Pt VU. Fran Lowes, RN VAST

## 2017-01-17 NOTE — Clinical Social Work Note (Signed)
CSW met with patient to confirm that she would prefer to return home rather than going to SNF since her husband is sick. She reports they have the support of their three children, other family members, and friends. CSW left voicemail for admissions coordinator at SNF to notify her. RNCM notified.  CSW signing off. Consult again if any social work needs arise.  Dayton Scrape, [place]

## 2017-01-17 NOTE — Discharge Summary (Addendum)
Physician Discharge Summary  [name] CZY:[id number] DOB: 1945-10-25 DOA: 12/23/2016  PCP: [name] Beard, MD  Admit date: 12/23/2016 Discharge date: 01/17/2017  Admitted From: Home  Disposition: Home   Recommendations for Outpatient Follow-up:  1. Follow up with PCP in 1-2 weeks 2. Please obtain BMP/CBC in one week 3. Needs INR in 2 to 3 days, adjust coumadin as needed.  4. Needs to follow up with vascular post AV fistula.   Home Health; yes.   Discharge Condition: stable.  CODE STATUS: Full Code.  Diet recommendation: Heart Healthy   Brief/Interim Summary:  [name] is an 71 y.o. female with medical history significant for insulin-dependent diabetes mellitus, chronic kidney disease stage IV, chronic combined systolic/diastolic CHF, chronic atrial fibrillation, and coronary artery disease, presented to the ED at the direction of her nephrologist for evaluation of swelling and fatigue. She has been followed by cardiology and nephrology for worsening fluid status complicating worsening kidney function. She continued to be volume overloaded despite increase in her diuretics recently. Admitted with acute on chronic diastolic heart failure secondary to Acute on chronic renal failure. Nephrology and cardiology following. Unable to manage volume status despite aggressive IV diuresis. Now deemed ESRD. Temporary right IJ HD catheter placed and HD started on 01/07/17.    Assessment/Plan:   Principal Problem:   CKD (chronic kidney disease), stage IV (HCC) Active Problems:   Essential hypertension   Coronary artery disease   Acute on chronic combined systolic and diastolic CHF (congestive heart failure) (HCC)   Elevated troponin   DM type 2 causing vascular disease (HCC)   Pleural effusion on right   Anemia in chronic kidney disease   Atrial fibrillation (HCC)   Hypokalemia   Hypervolemia   Rhonchi   Pressure injury of skin  Acute on chronic combined  systolic and diastolic decompensated heart failure: - 2D echo in November 11, 2016 showed an EF of 30% with grade 2 diastolic heart failure. - On admission cardiology and nephrology were consulted, despite extensive efforts to manage his diuresis patient did not make status viable progress diuresing. - After extensive discussion with family patient agreed to proceed with hemodialysis which was started on 01/07/2017 - volume manage with HD  Acute kidney injury superimposed on chronic renal disease stage IV leading to stage V now on hemodialysis: - Underwent AV graft placement on 12/30/2016. - Nephrology initiated dialysis on 01/07/2017, he has had 4 days of HD. - Appreciate nephrology's assistance. Patient underwent  tunneled catheter placement.  Resume plavix and coumadin  Paroxysmal atrial fibrillation: - Cardiology was consulted they recommended to continue amiodarone, metoprolol, Plavix. - Coumadin was held we are to proceed with tunneled catheter. - INR 1.1---resume coumadin. patient wants to be discharge, does not wants to stay for heparin bridge. She will be discharge on 7.5 mg coumadin. She will follow up with her cardiologist on Friday/    Coronary artery disease: - Asymptomatic continue with  mdur metoprolol and statins. -resume plavix.   Diabetes mellitus type 2: - Uncontrolled with mild fluctuations continue current regimen, long-acting insulin plus sliding scale. -hypoglycemia--continue with lantus.   Hypokalemia: - To be repleated during HD.  Anemia chronic disease: - Seems to be stable continue iron supplementation.  Leukopenia: - Of unclear etiology follow-up with outpatient PCP. -stable.   Left hand numbness; recent AV graft.  Stable. Evaluated by Vascular. follow up out patient.   Left Heel Staging: Deep Tissue Injury - stage I ; reddish or maroon localized area  of discolored intact skin or blood-filled blister due to damage of underlying soft tissue  from pressure and/or shear. Voltaren PRN. Improved.  Left  heel pain; mild redness.   Disposition: Patient wants to go home, her husband was just enrolled in hospice care. She wanst to spend time with him. She does not  wants to stay in the hospital for heparin bridge.      Discharge Diagnoses:  Principal Problem:   CKD (chronic kidney disease), stage IV (HCC) Active Problems:   Essential hypertension   Coronary artery disease   Acute on chronic combined systolic and diastolic CHF (congestive heart failure) (HCC)   Elevated troponin   DM type 2 causing vascular disease (HCC)   Pleural effusion on right   Anemia in chronic kidney disease   Atrial fibrillation (HCC)   Hypokalemia   Hypervolemia   Rhonchi   Pressure injury of skin    Discharge Instructions  Discharge Instructions    Diet - low sodium heart healthy   Complete by:  As directed    Diet - low sodium heart healthy   Complete by:  As directed    Increase activity slowly   Complete by:  As directed    Increase activity slowly   Complete by:  As directed    Increase activity slowly   Complete by:  As directed      Allergies as of 01/17/2017      Reactions   Ace Inhibitors Cough   Amlodipine Swelling   UNSPECIFIED EDEMA   Codeine Rash      Medication List    STOP taking these medications   hydrALAZINE 100 MG tablet Commonly known as:  APRESOLINE   metolazone 2.5 MG tablet Commonly known as:  ZAROXOLYN   NOVOLOG FLEXPEN 100 UNIT/ML FlexPen Generic drug:  insulin aspart   torsemide 20 MG tablet Commonly known as:  DEMADEX     TAKE these medications   albuterol 108 (90 Base) MCG/ACT inhaler Commonly known as:  PROVENTIL HFA;VENTOLIN HFA Inhale 1-2 puffs into the lungs every 6 (six) hours as needed for wheezing or shortness of breath.   albuterol (2.5 MG/3ML) 0.083% nebulizer solution Commonly known as:  PROVENTIL Take 3 mLs (2.5 mg total) by nebulization every 6 (six) hours as needed for  wheezing or shortness of breath.   ALPRAZolam 0.5 MG tablet Commonly known as:  XANAX Take 0.5 mg by mouth at bedtime.   amiodarone 200 MG tablet Commonly known as:  PACERONE Take 1 tablet (200 mg total) daily by mouth. Start taking on:  01/18/2017 What changed:    when to take this  additional instructions   calcitRIOL 0.5 MCG capsule Commonly known as:  ROCALTROL Take 1 capsule (0.5 mcg total) daily by mouth. Start taking on:  01/18/2017   cetirizine 10 MG tablet Commonly known as:  ZYRTEC Take 5 mg by mouth daily.   clopidogrel 75 MG tablet Commonly known as:  PLAVIX Take 1 tablet (75 mg total) by mouth daily.   cyclobenzaprine 5 MG tablet Commonly known as:  FLEXERIL Take 5 mg by mouth as needed (lower back pain). osteoporosis with out current pathological fracture for 14 days   diazepam 5 MG tablet Commonly known as:  VALIUM Take 1 tablet (5 mg total) by mouth every 12 (twelve) hours as needed for muscle spasms.   ferrous sulfate 325 (65 FE) MG tablet Take 325 mg by mouth daily with breakfast.   fluticasone 50 MCG/ACT nasal spray Commonly known  as:  FLONASE Place 1 spray into both nostrils daily.   insulin glargine 100 UNIT/ML injection Commonly known as:  LANTUS Inject 0.15 mLs (15 Units total) at bedtime into the skin. What changed:  how much to take   isosorbide mononitrate 30 MG 24 hr tablet Commonly known as:  IMDUR Take 1 tablet (30 mg total) 2 (two) times daily by mouth. What changed:    medication strength  when to take this   latanoprost 0.005 % ophthalmic solution Commonly known as:  XALATAN Place 1 drop into both eyes at bedtime.   meclizine 25 MG tablet Commonly known as:  ANTIVERT Take 12.5 mg by mouth 2 (two) times daily.   metoprolol succinate 25 MG 24 hr tablet Commonly known as:  TOPROL-XL Take 1 tablet (25 mg total) at bedtime by mouth. What changed:    medication strength  how much to take  when to take  this  additional instructions   nitroGLYCERIN 0.4 MG SL tablet Commonly known as:  NITROSTAT Place 1 tablet (0.4 mg total) under the tongue every 5 (five) minutes as needed for chest pain.   pantoprazole 40 MG tablet Commonly known as:  PROTONIX Take 40 mg by mouth daily.   rosuvastatin 40 MG tablet Commonly known as:  CRESTOR Take 1 tablet (40 mg total) by mouth at bedtime.   warfarin 7.5 MG tablet Commonly known as:  COUMADIN Take 1 tablet (7.5 mg total) daily at 6 PM by mouth. What changed:    how much to take  additional instructions      Follow-up Information    Angelia Mould, MD Follow up in 2 week(s).   Specialties:  Vascular Surgery, Cardiology Why:  Office will call for appointment Contact information: [place] [address] [phone]        [facility] Follow up.   Contact information: [place] [address] [phone]        [facility] Follow up.   Why:  They will do your home health care at your home Contact information: [address] [place] [address] tele number [phone]         Allergies  Allergen Reactions  . Ace Inhibitors Cough  . Amlodipine Swelling    UNSPECIFIED EDEMA   . Codeine Rash    Consultations:  Cardiology  Nephrology    Procedures/Studies: Dg Chest 2 View  Result Date: 12/23/2016 CLINICAL DATA:  Bilateral lower leg and pedal edema. Pleural effusion and weakness. EXAM: CHEST  2 VIEW COMPARISON:  12/07/2016 FINDINGS: Moderate right and small left pleural effusions persist with mild central vascular congestion. Adjacent compressive atelectasis is noted. Heart size and mediastinal contours are stable to the extent visualized. No acute nor suspicious osseous lesions. Degenerate changes are seen along the lower thoracic spine. IMPRESSION: Right greater than left pleural effusions with central vascular congestion. Findings are  similar that of 12/07/2016. Electronically Signed   By: Ashley Royalty M.D.   On: 12/23/2016 17:51   US Renal  Result Date: 12/26/2016 CLINICAL DATA:  Acute onset of renal insufficiency. Initial encounter. EXAM: RENAL / URINARY TRACT ULTRASOUND COMPLETE COMPARISON:  Renal ultrasound performed 12/12/2014, and CT of the abdomen and pelvis from 03/20/2016 FINDINGS: Right Kidney: Length: 11.1 cm. Echogenicity within normal limits. No mass or hydronephrosis visualized. Left Kidney: Length: 10.0 cm. Echogenicity within normal limits. Left renal stones measure up to 6 mm in size. No mass or hydronephrosis visualized. Bladder:  Decompressed and not well characterized. IMPRESSION: 1. No evidence of hydronephrosis. 2. Nonobstructing left renal stones measure up to 6 mm in size. Electronically Signed   By: Garald Balding M.D.   On: 12/26/2016 21:50   Ir Fluoro Guide Cv Line Right  Result Date: 01/16/2017 CLINICAL DATA:  End-stage renal disease with current non tunneled temporary hemodialysis catheter. A tunneled hemodialysis catheter is now needed for longer-term use. EXAM: TUNNELED CENTRAL VENOUS HEMODIALYSIS CATHETER PLACEMENT WITH ULTRASOUND AND FLUOROSCOPIC GUIDANCE ANESTHESIA/SEDATION: 2.0 mg IV Versed; 75 mcg IV Fentanyl. Total Moderate Sedation Time:  24  minutes. The patient's level of consciousness and physiologic status were continuously monitored during the procedure by Radiology nursing. MEDICATIONS: 2 g IV Ancef. As antibiotic prophylaxis, Ancef was ordered pre-procedure and administered intravenously within one hour of incision. FLUOROSCOPY TIME:  1 minutes and 12 seconds.  4.0 mGy. PROCEDURE: The procedure, risks, benefits, and alternatives were explained to the patient. Questions regarding the procedure were encouraged and answered. The patient understands and consents to the procedure. A timeout was performed prior to initiating the procedure. The right neck and chest were prepped with chlorhexidine in a  sterile fashion, and a sterile drape was applied covering the operative field. Maximum barrier sterile technique with sterile gowns and gloves were used for the procedure. Local anesthesia was provided with 1% lidocaine. After creating a small venotomy incision, a 21 gauge needle was advanced into the right internal jugular vein under direct, real-time ultrasound guidance. Ultrasound image documentation was performed. After securing guidewire access, an 8 Fr dilator was placed. A J-wire was kinked to measure appropriate catheter length. A Palindrome tunneled hemodialysis catheter measuring 23 cm from tip to cuff was chosen for placement. This was tunneled in a retrograde fashion from the chest wall to the venotomy incision. At the venotomy, serial dilatation was performed and a 16 Fr peel-away sheath was placed over a guidewire. The catheter was then placed through the sheath and the sheath removed. Final catheter positioning was confirmed and documented with a fluoroscopic spot image. The catheter was aspirated, flushed with saline, and injected with appropriate volume heparin dwells. The venotomy incision was closed with subcutaneous subcuticular 4-0 Vicryl. Dermabond was applied to the incision. The catheter exit site was secured with 0-Prolene retention sutures. COMPLICATIONS: None.  No pneumothorax. FINDINGS: After catheter placement, the tip lies in the right atrium. The catheter aspirates normally and is ready for immediate use. IMPRESSION: Placement of tunneled hemodialysis catheter via the right internal jugular vein. The catheter tip lies in the right atrium. The catheter is ready for immediate use. Electronically Signed   By: Aletta Edouard M.D.   On: 01/16/2017 15:41   Ir Fluoro Guide Cv Line Right  Result Date: 01/07/2017 INDICATION: End-stage renal disease EXAM: TEMPORARY RIGHT JUGULAR DIALYSIS CATHETER PLACEMENT MEDICATIONS: None ANESTHESIA/SEDATION: None FLUOROSCOPY TIME:  Fluoroscopy Time: 1  minutes  seconds ( mGy). COMPLICATIONS: None immediate. PROCEDURE: Informed written consent was obtained from the patient after a thorough discussion of the procedural risks, benefits and alternatives. All questions were addressed. Maximal Sterile Barrier Technique was utilized including caps, mask, sterile gowns, sterile gloves, sterile drape, hand hygiene and skin antiseptic. A timeout was performed prior to the initiation of the procedure. The right neck was prepped and draped in a sterile fashion. 1% lidocaine was utilized for local anesthesia. Under sonographic guidance, a micropuncture needle was inserted into the right jugular vein which was removed over a 018 wire which was up sized to an Amplatz.  The dialysis catheter dilator followed by the temporary dialysis catheter were advanced over the Amplatz wire. It was flushed and sewn in place. Heparin was instilled. FINDINGS: The tip of the right jugular temporary dialysis catheter is at the cavoatrial junction. IMPRESSION: Successful right jugular temporary dialysis catheter placement with its tip at the cavoatrial junction. Electronically Signed   By: Marybelle Killings M.D.   On: 01/07/2017 11:55   Ir US Guide Vasc Access Right  Result Date: 01/16/2017 CLINICAL DATA:  End-stage renal disease with current non tunneled temporary hemodialysis catheter. A tunneled hemodialysis catheter is now needed for longer-term use. EXAM: TUNNELED CENTRAL VENOUS HEMODIALYSIS CATHETER PLACEMENT WITH ULTRASOUND AND FLUOROSCOPIC GUIDANCE ANESTHESIA/SEDATION: 2.0 mg IV Versed; 75 mcg IV Fentanyl. Total Moderate Sedation Time:  24  minutes. The patient's level of consciousness and physiologic status were continuously monitored during the procedure by Radiology nursing. MEDICATIONS: 2 g IV Ancef. As antibiotic prophylaxis, Ancef was ordered pre-procedure and administered intravenously within one hour of incision. FLUOROSCOPY TIME:  1 minutes and 12 seconds.  4.0 mGy. PROCEDURE: The  procedure, risks, benefits, and alternatives were explained to the patient. Questions regarding the procedure were encouraged and answered. The patient understands and consents to the procedure. A timeout was performed prior to initiating the procedure. The right neck and chest were prepped with chlorhexidine in a sterile fashion, and a sterile drape was applied covering the operative field. Maximum barrier sterile technique with sterile gowns and gloves were used for the procedure. Local anesthesia was provided with 1% lidocaine. After creating a small venotomy incision, a 21 gauge needle was advanced into the right internal jugular vein under direct, real-time ultrasound guidance. Ultrasound image documentation was performed. After securing guidewire access, an 8 Fr dilator was placed. A J-wire was kinked to measure appropriate catheter length. A Palindrome tunneled hemodialysis catheter measuring 23 cm from tip to cuff was chosen for placement. This was tunneled in a retrograde fashion from the chest wall to the venotomy incision. At the venotomy, serial dilatation was performed and a 16 Fr peel-away sheath was placed over a guidewire. The catheter was then placed through the sheath and the sheath removed. Final catheter positioning was confirmed and documented with a fluoroscopic spot image. The catheter was aspirated, flushed with saline, and injected with appropriate volume heparin dwells. The venotomy incision was closed with subcutaneous subcuticular 4-0 Vicryl. Dermabond was applied to the incision. The catheter exit site was secured with 0-Prolene retention sutures. COMPLICATIONS: None.  No pneumothorax. FINDINGS: After catheter placement, the tip lies in the right atrium. The catheter aspirates normally and is ready for immediate use. IMPRESSION: Placement of tunneled hemodialysis catheter via the right internal jugular vein. The catheter tip lies in the right atrium. The catheter is ready for immediate  use. Electronically Signed   By: Aletta Edouard M.D.   On: 01/16/2017 15:41   Ir US Guide Vasc Access Right  Result Date: 01/07/2017 INDICATION: End-stage renal disease EXAM: TEMPORARY RIGHT JUGULAR DIALYSIS CATHETER PLACEMENT MEDICATIONS: None ANESTHESIA/SEDATION: None FLUOROSCOPY TIME:  Fluoroscopy Time: 1 minutes  seconds ( mGy). COMPLICATIONS: None immediate. PROCEDURE: Informed written consent was obtained from the patient after a thorough discussion of the procedural risks, benefits and alternatives. All questions were addressed. Maximal Sterile Barrier Technique was utilized including caps, mask, sterile gowns, sterile gloves, sterile drape, hand hygiene and skin antiseptic. A timeout was performed prior to the initiation of the procedure. The right neck was prepped and draped in a sterile fashion. 1%  lidocaine was utilized for local anesthesia. Under sonographic guidance, a micropuncture needle was inserted into the right jugular vein which was removed over a 018 wire which was up sized to an Amplatz. The dialysis catheter dilator followed by the temporary dialysis catheter were advanced over the Amplatz wire. It was flushed and sewn in place. Heparin was instilled. FINDINGS: The tip of the right jugular temporary dialysis catheter is at the cavoatrial junction. IMPRESSION: Successful right jugular temporary dialysis catheter placement with its tip at the cavoatrial junction. Electronically Signed   By: Marybelle Killings M.D.   On: 01/07/2017 11:55   Dg Chest Port 1 View  Result Date: 12/26/2016 CLINICAL DATA:  Rhonchi EXAM: PORTABLE CHEST 1 VIEW COMPARISON:  12/23/2016 FINDINGS: There are layering bilateral pleural effusions, right greater than left, similar to prior study. Bilateral lower lobe airspace opacities, also greater on the right. This could reflect atelectasis or pneumonia. Vascular congestion and mild cardiomegaly. IMPRESSION: Findings similar to prior study with bilateral layering  effusions and lower lobe airspace opacities, right greater than left. Cardiomegaly, vascular congestion. Electronically Signed   By: Rolm Baptise M.D.   On: 12/26/2016 09:43    (Echo, Carotid, EGD, Colonoscopy, ERCP)    Subjective:   Discharge Exam: Vitals:   01/17/17 1151 01/17/17 1339  BP: (!) 107/44 107/67  Pulse: 66 69  Resp: 13 16  Temp: 98.3 F (36.8 C) 97.9 F (36.6 C)  SpO2:  92%   Vitals:   01/17/17 1100 01/17/17 1130 01/17/17 1151 01/17/17 1339  BP: (!) 106/49  (!) 107/44 107/67  Pulse: 68  66 69  Resp: (!) 22 16 13 16   Temp:   98.3 F (36.8 C) 97.9 F (36.6 C)  TempSrc:   Oral Oral  SpO2:    92%  Weight:      Height:        General: Pt is alert, awake, not in acute distress Cardiovascular: RRR, S1/S2 +, no rubs, no gallops Respiratory: CTA bilaterally, no wheezing, no rhonchi Abdominal: Soft, NT, ND, bowel sounds + Extremities: no edema, no cyanosis    The results of significant diagnostics from this hospitalization (including imaging, microbiology, ancillary and laboratory) are listed below for reference.     Microbiology: No results found for this or any previous visit (from the past 240 hour(s)).   Labs: BNP (last 3 results) Recent Labs    10/31/16 0528 12/04/16 1820 12/23/16 1855  BNP 648.0* 2,222.9* 4,034.7*   Basic Metabolic Panel: Recent Labs  Lab 01/13/17 0936 01/14/17 0809 01/15/17 1056 01/16/17 0435 01/17/17 0447  NA 134* 132* 130* 131* 135  K 3.9 3.9 3.9 3.9 3.9  CL 98* 96* 96* 97* 99*  CO2 26 27 24 26 26   GLUCOSE 191* 159* 148* 230* 111*  BUN 31* 21* 21* 28* 35*  CREATININE 4.87* 3.73* 3.71* 4.78* 5.30*  CALCIUM 9.0 8.6* 8.9 8.7* 9.3  PHOS 4.1 3.0 3.3 4.3 4.9*   Liver Function Tests: Recent Labs  Lab 01/13/17 0936 01/14/17 0809 01/15/17 1056 01/16/17 0435 01/17/17 0447  ALBUMIN 2.7* 2.6* 2.9* 2.5* 2.5*   No results for input(s): LIPASE, AMYLASE in the last 168 hours. No results for input(s): AMMONIA in the  last 168 hours. CBC: Recent Labs  Lab 01/13/17 1217 01/14/17 0809 01/15/17 1056 01/16/17 0435 01/17/17 0447  WBC 4.0 3.9* 4.0 3.7* 3.8*  HGB 9.2* 8.9* 10.0* 8.9* 9.3*  HCT 31.5* 30.6* 33.9* 30.5* 31.3*  MCV 99.4 98.4 97.4 97.4 97.5  PLT 165  166 131* 132* 138*   Cardiac Enzymes: No results for input(s): CKTOTAL, CKMB, CKMBINDEX, TROPONINI in the last 168 hours. BNP: Invalid input(s): POCBNP CBG: Recent Labs  Lab 01/16/17 0755 01/16/17 1143 01/16/17 1629 01/16/17 2103 01/17/17 1406  GLUCAP 214* 98 79 150* 120*   D-Dimer No results for input(s): DDIMER in the last 72 hours. Hgb A1c No results for input(s): HGBA1C in the last 72 hours. Lipid Profile No results for input(s): CHOL, HDL, LDLCALC, TRIG, CHOLHDL, LDLDIRECT in the last 72 hours. Thyroid function studies No results for input(s): TSH, T4TOTAL, T3FREE, THYROIDAB in the last 72 hours.  Invalid input(s): FREET3 Anemia work up No results for input(s): VITAMINB12, FOLATE, FERRITIN, TIBC, IRON, RETICCTPCT in the last 72 hours. Urinalysis    Component Value Date/Time   COLORURINE YELLOW 12/23/2016 1810   APPEARANCEUR CLEAR 12/23/2016 1810   LABSPEC 1.009 12/23/2016 1810   PHURINE 5.0 12/23/2016 1810   GLUCOSEU NEGATIVE 12/23/2016 1810   HGBUR SMALL (A) 12/23/2016 1810   BILIRUBINUR NEGATIVE 12/23/2016 1810   KETONESUR NEGATIVE 12/23/2016 1810   PROTEINUR NEGATIVE 12/23/2016 1810   UROBILINOGEN 0.2 08/23/2007 2037   NITRITE NEGATIVE 12/23/2016 1810   LEUKOCYTESUR LARGE (A) 12/23/2016 1810   Sepsis Labs Invalid input(s): PROCALCITONIN,  WBC,  LACTICIDVEN Microbiology No results found for this or any previous visit (from the past 240 hour(s)).   Time coordinating discharge: Over 30 minutes  SIGNED:   Elmarie Shiley, MD  [facility] 01/17/2017, 2:27 PM Pager   If 7PM-7AM, please contact night-coverage [email or web address] Password [email or web address]

## 2017-01-17 NOTE — Progress Notes (Signed)
Pt and daughter were given all discharge instructions and both verbalized understanding.  Pt and daughter have been given all hard prescriptions and verbalizes understanding of filling them at any drug store. Pt and daughter are aware of all return appts and follow ups and that home health will be seeing them tomorrow. Pt is discharged home with all belongings  via wc with  Daughter.

## 2017-01-17 NOTE — Progress Notes (Signed)
ANTICOAGULATION CONSULT NOTE - Follow Up Consult  Pharmacy Consult for Heparin and Coumadin Indication: atrial fibrillation  Allergies  Allergen Reactions  . Ace Inhibitors Cough  . Amlodipine Swelling    UNSPECIFIED EDEMA   . Codeine Rash    Patient Measurements: Height: 5\' 8"  (172.7 cm) Weight: 188 lb 4.4 oz (85.4 kg)(standing weight) IBW/kg (Calculated) : 63.9 Heparin Dosing Weight:  85 kg  Vital Signs: Temp: 97.9 F (36.6 C) (11/13 1339) Temp Source: Oral (11/13 1339) BP: 107/67 (11/13 1339) Pulse Rate: 69 (11/13 1339)  Labs: Recent Labs    01/15/17 1056 01/16/17 0435 01/17/17 0447  HGB 10.0* 8.9* 9.3*  HCT 33.9* 30.5* 31.3*  PLT 131* 132* 138*  LABPROT 15.0 15.2 14.6  INR 1.19 1.21 1.15  HEPARINUNFRC 0.36 0.40 0.33  CREATININE 3.71* 4.78* 5.30*   ESRD   Assessment: 22 YOF with history of Afib on Coumadin PTA.  Patient was transitioned to heparin bridge for HD catheter placement, which was done this afternoon. Per Dr. Kathlene Cote, [facility] to resume heparin drip 11/12 and he would like to wait until 11/13 to resume Coumadin and Plavix.  Heparin level therapeutic: 0.33, CBC low stable, Hgb 9.3, PLT 138 INR 1.15  Home warfarin regimen: alternating 3.75 and 7.5 doses Noted patient is very sensitive to warfarin possible DDI: amiodarone   Goal of Therapy:  INR 2-3 Heparin level 0.3-0.7 units/ml Monitor platelets by anticoagulation protocol: Yes   Plan:  Continue heparin drip at 1000 units/hr Warfarin 7.5mg  x1 tonight Daily heparin level, PT/INR and CBC  Monitor for s/sx of bleeding  Dawayne Cirri, RPh Pager: [phone] 01/17/2017,2:03 PM

## 2017-01-17 NOTE — Telephone Encounter (Signed)
Cxl's 01/19/17 appt. Sched appt 02/01/17 at 8:45. Lm on hm#.

## 2017-01-17 NOTE — Procedures (Signed)
I was present at this dialysis session. I have reviewed the session itself and made appropriate changes.   Using new TDC Qb 350 AP ok.  But still has temp cath, will order removal.  UF goal 3L.  Tolearting well. No inpatient renal issues.  Filed Weights   01/16/17 0500 01/17/17 0456 01/17/17 0740  Weight: 85.1 kg (187 lb 9.6 oz) 82.6 kg (182 lb 1.6 oz) 85.4 kg (188 lb 4.4 oz)    Recent Labs  Lab 01/17/17 0447  NA 135  K 3.9  CL 99*  CO2 26  GLUCOSE 111*  BUN 35*  CREATININE 5.30*  CALCIUM 9.3  PHOS 4.9*    Recent Labs  Lab 01/15/17 1056 01/16/17 0435 01/17/17 0447  WBC 4.0 3.7* 3.8*  HGB 10.0* 8.9* 9.3*  HCT 33.9* 30.5* 31.3*  MCV 97.4 97.4 97.5  PLT 131* 132* 138*    Scheduled Meds: . ALPRAZolam  0.5 mg Oral QHS  . amiodarone  200 mg Oral Daily  . calcitRIOL  0.5 mcg Oral Daily  . clopidogrel  75 mg Oral Daily  . cyclobenzaprine  5 mg Oral QHS  . darbepoetin (ARANESP) injection - NON-DIALYSIS  150 mcg Subcutaneous Q Mon-1800  . diclofenac sodium  2 g Topical BID  . fluticasone  1 spray Each Nare Daily  . insulin aspart  0-15 Units Subcutaneous TID WC  . insulin glargine  15 Units Subcutaneous QHS  . isosorbide mononitrate  30 mg Oral BID  . latanoprost  1 drop Both Eyes QHS  . loratadine  10 mg Oral Daily  . meclizine  12.5 mg Oral BID  . mouth rinse  15 mL Mouth Rinse BID  . metoprolol succinate  25 mg Oral QHS  . pantoprazole  40 mg Oral Daily  . rosuvastatin  40 mg Oral q1800  . senna-docusate  2 tablet Oral BID  . sodium chloride flush  3 mL Intravenous Q12H  . Warfarin - Pharmacist Dosing Inpatient   Does not apply q1800   Continuous Infusions: . sodium chloride    . sodium chloride 10 mL/hr at 01/14/17 0200  . heparin 1,000 Units/hr (01/16/17 1955)   PRN Meds:.sodium chloride, acetaminophen, albuterol, diazepam, fentaNYL (SUBLIMAZE) injection, fentaNYL (SUBLIMAZE) injection, guaiFENesin-dextromethorphan, ondansetron (ZOFRAN) IV, sodium chloride  flush, sodium chloride flush   Pearson Grippe  MD 01/17/2017, 9:09 AM

## 2017-01-17 NOTE — Telephone Encounter (Signed)
-----   Message from Mena Goes, RN sent at 01/15/2017 11:51 AM EST ----- Regarding: 2-3 weeks with Dr. Scot Dock   ----- Message ----- From: Elam Dutch, MD Sent: 01/15/2017  10:13 AM To: Vvs Charge Pool  Pt needs follow up with Dr Scot Dock in a 2-3 weeks to reassess steal  Ruta Hinds

## 2017-01-17 NOTE — Care Management Note (Signed)
Case Management Note  Patient Details  Name: [name] MRN: [id number] Date of Birth: [birth year]  Action/Plan: Patient lives at home with her spouse; PCP: [name], MD; has private insurance with Medicare; she has decided to return home at discharge due to her spouse has been recently diagnosed with Ca; [place] choice offered, patient requested [facility]/ [place] area; referral placed as requested; orders faxed to [phone].  Expected Discharge Date:   possibly 01/17/2017               Expected Discharge Plan:  [place]  In-House Referral:  Clinical Social Work  Discharge planning Services  CM Consult   Choice offered to:  Patient  HH Arranged:  RN, Nurse's Aide, PT [place] Agency:  [place]  Status of Service:  In process, will continue to follow   Sherrilyn Rist [phone] 01/17/2017, 1:53 PM

## 2017-01-17 NOTE — Telephone Encounter (Signed)
Currently in [place].  Was told to contact our office to see when she needs to be seen to check ger inr. Said they were suggesting Friday

## 2017-01-18 ENCOUNTER — Telehealth: Payer: Self-pay | Admitting: *Deleted

## 2017-01-18 DIAGNOSIS — Z7901 Long term (current) use of anticoagulants: Secondary | ICD-10-CM | POA: Diagnosis not present

## 2017-01-18 LAB — PROTIME-INR: INR: 1.3 — AB (ref 0.9–1.1)

## 2017-01-18 NOTE — Telephone Encounter (Signed)
Spoke with Aldona Bar RN [facility].  Pt was d/c from hospital yesterday and was scheduled to have POC INR check today.  Home Health[facility] POC strips were recalled and they have not received a new batch yet.  RN tried to perform a venapuncture but was unable to obtain sample.  She will call

## 2017-01-18 NOTE — Care Management (Signed)
CM received call from [employer], Santiago Glad. Being seen by [facility] office, [place] wants to make visit today but has no received orders. Pt not discharged from [facility], CM notes she was DC'd from [facility], Santiago Glad aware and will pull pt info from chart.

## 2017-01-19 ENCOUNTER — Encounter: Payer: BLUE CROSS/BLUE SHIELD | Admitting: Vascular Surgery

## 2017-01-19 ENCOUNTER — Ambulatory Visit (INDEPENDENT_AMBULATORY_CARE_PROVIDER_SITE_OTHER): Payer: BLUE CROSS/BLUE SHIELD | Admitting: *Deleted

## 2017-01-19 DIAGNOSIS — Z5181 Encounter for therapeutic drug level monitoring: Secondary | ICD-10-CM | POA: Diagnosis not present

## 2017-01-19 DIAGNOSIS — I4891 Unspecified atrial fibrillation: Secondary | ICD-10-CM

## 2017-01-19 NOTE — Telephone Encounter (Signed)
Results were   13.6 and 1.3

## 2017-01-19 NOTE — Telephone Encounter (Signed)
Done.  See coumadin note. 

## 2017-01-23 ENCOUNTER — Ambulatory Visit (INDEPENDENT_AMBULATORY_CARE_PROVIDER_SITE_OTHER): Payer: BLUE CROSS/BLUE SHIELD | Admitting: *Deleted

## 2017-01-23 ENCOUNTER — Telehealth: Payer: Self-pay | Admitting: *Deleted

## 2017-01-23 DIAGNOSIS — Z5181 Encounter for therapeutic drug level monitoring: Secondary | ICD-10-CM

## 2017-01-23 DIAGNOSIS — I4891 Unspecified atrial fibrillation: Secondary | ICD-10-CM

## 2017-01-23 LAB — POCT INR: INR: 8

## 2017-01-23 NOTE — Telephone Encounter (Signed)
Done.  See coumadin note. 

## 2017-01-23 NOTE — Telephone Encounter (Signed)
Lori with [facility] called to report coumdin check.   INR 8 PT  96   Please call [phone]

## 2017-01-24 ENCOUNTER — Ambulatory Visit (INDEPENDENT_AMBULATORY_CARE_PROVIDER_SITE_OTHER): Payer: BLUE CROSS/BLUE SHIELD | Admitting: *Deleted

## 2017-01-24 DIAGNOSIS — Z5181 Encounter for therapeutic drug level monitoring: Secondary | ICD-10-CM

## 2017-01-24 DIAGNOSIS — I4891 Unspecified atrial fibrillation: Secondary | ICD-10-CM

## 2017-01-24 DIAGNOSIS — Z79899 Other long term (current) drug therapy: Secondary | ICD-10-CM | POA: Diagnosis not present

## 2017-01-24 LAB — POCT INR

## 2017-01-24 LAB — PROTIME-INR: INR: 8 — AB (ref 0.9–1.1)

## 2017-01-25 ENCOUNTER — Telehealth: Payer: Self-pay | Admitting: *Deleted

## 2017-01-25 NOTE — Telephone Encounter (Signed)
Called pt to follow up on our conversation from yesterday.  (pt was suppose to come to [facility] ED last night for Vitamin K.  Per Epic pt did not show up)  Left message on home phone to call me back..  Cell phone number not in service.  Also called [place] to inform her of above so they can check on patient. Awaiting return call.

## 2017-01-25 NOTE — Telephone Encounter (Signed)
Had tried to call pt back to see where she went to get Vitamin K shot.  No answer.  Do know pt's husband passed away and the visitation and service is today or tomorrow.  Left message for pt to call me first thing Monday morning.

## 2017-01-25 NOTE — Telephone Encounter (Signed)
Patient's daughter walked in to let us know that she had her shot

## 2017-01-28 ENCOUNTER — Other Ambulatory Visit: Payer: Self-pay

## 2017-01-28 ENCOUNTER — Inpatient Hospital Stay (HOSPITAL_COMMUNITY)
Admission: EM | Admit: 2017-01-28 | Discharge: 2017-02-02 | DRG: 917 | Disposition: A | Discharge status: Home Health | Payer: BLUE CROSS/BLUE SHIELD | Attending: Internal Medicine | Admitting: Internal Medicine

## 2017-01-28 ENCOUNTER — Encounter (HOSPITAL_COMMUNITY): Payer: Self-pay | Admitting: *Deleted

## 2017-01-28 DIAGNOSIS — Z7901 Long term (current) use of anticoagulants: Secondary | ICD-10-CM

## 2017-01-28 DIAGNOSIS — E785 Hyperlipidemia, unspecified: Secondary | ICD-10-CM | POA: Diagnosis present

## 2017-01-28 DIAGNOSIS — Z885 Allergy status to narcotic agent status: Secondary | ICD-10-CM

## 2017-01-28 DIAGNOSIS — R001 Bradycardia, unspecified: Secondary | ICD-10-CM | POA: Diagnosis present

## 2017-01-28 DIAGNOSIS — I2510 Atherosclerotic heart disease of native coronary artery without angina pectoris: Secondary | ICD-10-CM | POA: Diagnosis present

## 2017-01-28 DIAGNOSIS — N186 End stage renal disease: Secondary | ICD-10-CM | POA: Diagnosis present

## 2017-01-28 DIAGNOSIS — I480 Paroxysmal atrial fibrillation: Secondary | ICD-10-CM

## 2017-01-28 DIAGNOSIS — E11649 Type 2 diabetes mellitus with hypoglycemia without coma: Secondary | ICD-10-CM | POA: Diagnosis present

## 2017-01-28 DIAGNOSIS — Z955 Presence of coronary angioplasty implant and graft: Secondary | ICD-10-CM

## 2017-01-28 DIAGNOSIS — N184 Chronic kidney disease, stage 4 (severe): Secondary | ICD-10-CM

## 2017-01-28 DIAGNOSIS — M1990 Unspecified osteoarthritis, unspecified site: Secondary | ICD-10-CM | POA: Diagnosis present

## 2017-01-28 DIAGNOSIS — E1122 Type 2 diabetes mellitus with diabetic chronic kidney disease: Secondary | ICD-10-CM | POA: Diagnosis present

## 2017-01-28 DIAGNOSIS — I959 Hypotension, unspecified: Secondary | ICD-10-CM | POA: Diagnosis not present

## 2017-01-28 DIAGNOSIS — R531 Weakness: Secondary | ICD-10-CM | POA: Diagnosis not present

## 2017-01-28 DIAGNOSIS — T447X1A Poisoning by beta-adrenoreceptor antagonists, accidental (unintentional), initial encounter: Principal | ICD-10-CM | POA: Diagnosis present

## 2017-01-28 DIAGNOSIS — Z79899 Other long term (current) drug therapy: Secondary | ICD-10-CM

## 2017-01-28 DIAGNOSIS — N189 Chronic kidney disease, unspecified: Secondary | ICD-10-CM

## 2017-01-28 DIAGNOSIS — D631 Anemia in chronic kidney disease: Secondary | ICD-10-CM | POA: Diagnosis present

## 2017-01-28 DIAGNOSIS — Z7902 Long term (current) use of antithrombotics/antiplatelets: Secondary | ICD-10-CM

## 2017-01-28 DIAGNOSIS — Z888 Allergy status to other drugs, medicaments and biological substances status: Secondary | ICD-10-CM

## 2017-01-28 DIAGNOSIS — R778 Other specified abnormalities of plasma proteins: Secondary | ICD-10-CM | POA: Diagnosis present

## 2017-01-28 DIAGNOSIS — Z833 Family history of diabetes mellitus: Secondary | ICD-10-CM

## 2017-01-28 DIAGNOSIS — I10 Essential (primary) hypertension: Secondary | ICD-10-CM | POA: Diagnosis present

## 2017-01-28 DIAGNOSIS — K5900 Constipation, unspecified: Secondary | ICD-10-CM | POA: Diagnosis not present

## 2017-01-28 DIAGNOSIS — Z794 Long term (current) use of insulin: Secondary | ICD-10-CM

## 2017-01-28 DIAGNOSIS — Z8249 Family history of ischemic heart disease and other diseases of the circulatory system: Secondary | ICD-10-CM

## 2017-01-28 DIAGNOSIS — I132 Hypertensive heart and chronic kidney disease with heart failure and with stage 5 chronic kidney disease, or end stage renal disease: Secondary | ICD-10-CM | POA: Diagnosis present

## 2017-01-28 DIAGNOSIS — R7989 Other specified abnormal findings of blood chemistry: Secondary | ICD-10-CM

## 2017-01-28 DIAGNOSIS — Z992 Dependence on renal dialysis: Secondary | ICD-10-CM

## 2017-01-28 DIAGNOSIS — K219 Gastro-esophageal reflux disease without esophagitis: Secondary | ICD-10-CM | POA: Diagnosis present

## 2017-01-28 DIAGNOSIS — I5042 Chronic combined systolic (congestive) and diastolic (congestive) heart failure: Secondary | ICD-10-CM | POA: Diagnosis present

## 2017-01-28 NOTE — ED Triage Notes (Signed)
Pt more weak today than normal. Pt had dialysis this afternoon. Pt states more weak then normal. Has been on dialysis a few weeks.

## 2017-01-29 ENCOUNTER — Emergency Department (HOSPITAL_COMMUNITY): Payer: BLUE CROSS/BLUE SHIELD

## 2017-01-29 ENCOUNTER — Encounter (HOSPITAL_COMMUNITY): Payer: Self-pay | Admitting: Emergency Medicine

## 2017-01-29 ENCOUNTER — Other Ambulatory Visit: Payer: Self-pay

## 2017-01-29 DIAGNOSIS — Z79899 Other long term (current) drug therapy: Secondary | ICD-10-CM | POA: Diagnosis not present

## 2017-01-29 DIAGNOSIS — E7800 Pure hypercholesterolemia, unspecified: Secondary | ICD-10-CM | POA: Diagnosis not present

## 2017-01-29 DIAGNOSIS — I132 Hypertensive heart and chronic kidney disease with heart failure and with stage 5 chronic kidney disease, or end stage renal disease: Secondary | ICD-10-CM | POA: Diagnosis present

## 2017-01-29 DIAGNOSIS — Z888 Allergy status to other drugs, medicaments and biological substances status: Secondary | ICD-10-CM | POA: Diagnosis not present

## 2017-01-29 DIAGNOSIS — Z7901 Long term (current) use of anticoagulants: Secondary | ICD-10-CM | POA: Diagnosis not present

## 2017-01-29 DIAGNOSIS — E785 Hyperlipidemia, unspecified: Secondary | ICD-10-CM | POA: Diagnosis present

## 2017-01-29 DIAGNOSIS — Z992 Dependence on renal dialysis: Secondary | ICD-10-CM | POA: Diagnosis not present

## 2017-01-29 DIAGNOSIS — R001 Bradycardia, unspecified: Secondary | ICD-10-CM | POA: Diagnosis present

## 2017-01-29 DIAGNOSIS — Z955 Presence of coronary angioplasty implant and graft: Secondary | ICD-10-CM | POA: Diagnosis not present

## 2017-01-29 DIAGNOSIS — I480 Paroxysmal atrial fibrillation: Secondary | ICD-10-CM | POA: Diagnosis present

## 2017-01-29 DIAGNOSIS — M1990 Unspecified osteoarthritis, unspecified site: Secondary | ICD-10-CM | POA: Diagnosis present

## 2017-01-29 DIAGNOSIS — I10 Essential (primary) hypertension: Secondary | ICD-10-CM | POA: Diagnosis not present

## 2017-01-29 DIAGNOSIS — T447X1A Poisoning by beta-adrenoreceptor antagonists, accidental (unintentional), initial encounter: Secondary | ICD-10-CM | POA: Diagnosis present

## 2017-01-29 DIAGNOSIS — R531 Weakness: Secondary | ICD-10-CM | POA: Diagnosis present

## 2017-01-29 DIAGNOSIS — I2510 Atherosclerotic heart disease of native coronary artery without angina pectoris: Secondary | ICD-10-CM | POA: Diagnosis present

## 2017-01-29 DIAGNOSIS — I25119 Atherosclerotic heart disease of native coronary artery with unspecified angina pectoris: Secondary | ICD-10-CM | POA: Diagnosis not present

## 2017-01-29 DIAGNOSIS — E1122 Type 2 diabetes mellitus with diabetic chronic kidney disease: Secondary | ICD-10-CM | POA: Diagnosis present

## 2017-01-29 DIAGNOSIS — I5042 Chronic combined systolic (congestive) and diastolic (congestive) heart failure: Secondary | ICD-10-CM | POA: Diagnosis present

## 2017-01-29 DIAGNOSIS — Z794 Long term (current) use of insulin: Secondary | ICD-10-CM | POA: Diagnosis not present

## 2017-01-29 DIAGNOSIS — I4891 Unspecified atrial fibrillation: Secondary | ICD-10-CM | POA: Diagnosis not present

## 2017-01-29 DIAGNOSIS — N184 Chronic kidney disease, stage 4 (severe): Secondary | ICD-10-CM | POA: Diagnosis not present

## 2017-01-29 DIAGNOSIS — D631 Anemia in chronic kidney disease: Secondary | ICD-10-CM | POA: Diagnosis present

## 2017-01-29 DIAGNOSIS — R748 Abnormal levels of other serum enzymes: Secondary | ICD-10-CM | POA: Diagnosis not present

## 2017-01-29 DIAGNOSIS — N186 End stage renal disease: Secondary | ICD-10-CM | POA: Diagnosis present

## 2017-01-29 DIAGNOSIS — K219 Gastro-esophageal reflux disease without esophagitis: Secondary | ICD-10-CM | POA: Diagnosis present

## 2017-01-29 DIAGNOSIS — Z7902 Long term (current) use of antithrombotics/antiplatelets: Secondary | ICD-10-CM | POA: Diagnosis not present

## 2017-01-29 LAB — I-STAT TROPONIN, ED: Troponin i, poc: 0.09 ng/mL (ref 0.00–0.08)

## 2017-01-29 LAB — BASIC METABOLIC PANEL
Anion gap: 17 — ABNORMAL HIGH (ref 5–15)
BUN: 46 mg/dL — AB (ref 6–20)
CHLORIDE: 91 mmol/L — AB (ref 101–111)
CO2: 25 mmol/L (ref 22–32)
CREATININE: 5.51 mg/dL — AB (ref 0.44–1.00)
Calcium: 9.3 mg/dL (ref 8.9–10.3)
GFR calc Af Amer: 8 mL/min — ABNORMAL LOW (ref >60)
GFR calc non Af Amer: 7 mL/min — ABNORMAL LOW (ref >60)
GLUCOSE: 390 mg/dL — AB (ref 65–99)
Potassium: 4.6 mmol/L (ref 3.5–5.1)
Sodium: 133 mmol/L — ABNORMAL LOW (ref 135–145)

## 2017-01-29 LAB — CBC WITH DIFFERENTIAL/PLATELET
Basophils Absolute: 0 10*3/uL (ref 0.0–0.1)
Basophils Relative: 0 %
Eosinophils Absolute: 0 10*3/uL (ref 0.0–0.7)
Eosinophils Relative: 0 %
HCT: 37.2 % (ref 36.0–46.0)
Hemoglobin: 10.7 g/dL — ABNORMAL LOW (ref 12.0–15.0)
Lymphocytes Relative: 16 %
Lymphs Abs: 1 10*3/uL (ref 0.7–4.0)
MCH: 28.5 pg (ref 26.0–34.0)
MCHC: 28.8 g/dL — ABNORMAL LOW (ref 30.0–36.0)
MCV: 99.2 fL (ref 78.0–100.0)
Monocytes Absolute: 0.4 10*3/uL (ref 0.1–1.0)
Monocytes Relative: 7 %
Neutro Abs: 4.7 10*3/uL (ref 1.7–7.7)
Neutrophils Relative %: 77 %
Platelets: 214 10*3/uL (ref 150–400)
RBC: 3.75 MIL/uL — ABNORMAL LOW (ref 3.87–5.11)
RDW: 20 % — ABNORMAL HIGH (ref 11.5–15.5)
WBC: 6.1 10*3/uL (ref 4.0–10.5)

## 2017-01-29 LAB — PROTIME-INR
INR: 3.65
Prothrombin Time: 36.1 seconds — ABNORMAL HIGH (ref 11.4–15.2)

## 2017-01-29 LAB — GLUCOSE, CAPILLARY
GLUCOSE-CAPILLARY: 127 mg/dL — AB (ref 65–99)
Glucose-Capillary: 348 mg/dL — ABNORMAL HIGH (ref 65–99)
Glucose-Capillary: 334 mg/dL — ABNORMAL HIGH (ref 65–99)
Glucose-Capillary: 183 mg/dL — ABNORMAL HIGH (ref 65–99)

## 2017-01-29 LAB — TROPONIN I
TROPONIN I: 0.1 ng/mL — AB (ref <0.03)
TROPONIN I: 0.11 ng/mL — AB (ref <0.03)

## 2017-01-29 LAB — MAGNESIUM: Magnesium: 2.1 mg/dL (ref 1.7–2.4)

## 2017-01-29 MED ORDER — INSULIN GLARGINE 100 UNIT/ML ~~LOC~~ SOLN
15.0000 [IU] | Freq: Every day | SUBCUTANEOUS | Status: DC
  Administered 2017-01-29: 15 [IU] via SUBCUTANEOUS
  Filled 2017-01-29 (×2): qty 0.15

## 2017-01-29 MED ORDER — ISOSORBIDE MONONITRATE ER 60 MG PO TB24
30.0000 mg | ORAL_TABLET | Freq: Two times a day (BID) | ORAL | Status: DC
  Administered 2017-01-29 – 2017-02-02 (×7): 30 mg via ORAL
  Filled 2017-01-29 (×9): qty 1

## 2017-01-29 MED ORDER — SODIUM CHLORIDE 0.9 % IV BOLUS (SEPSIS)
500.0000 mL | Freq: Once | INTRAVENOUS | Status: AC
  Administered 2017-01-29: 500 mL via INTRAVENOUS

## 2017-01-29 MED ORDER — WARFARIN - PHARMACIST DOSING INPATIENT: Status: DC

## 2017-01-29 MED ORDER — INSULIN ASPART 100 UNIT/ML ~~LOC~~ SOLN
0.0000 [IU] | Freq: Three times a day (TID) | SUBCUTANEOUS | Status: DC
  Administered 2017-01-29: 7 [IU] via SUBCUTANEOUS
  Administered 2017-01-29: 2 [IU] via SUBCUTANEOUS
  Administered 2017-01-29: 7 [IU] via SUBCUTANEOUS
  Administered 2017-01-30 (×2): 2 [IU] via SUBCUTANEOUS

## 2017-01-29 MED ORDER — MECLIZINE HCL 12.5 MG PO TABS
12.5000 mg | ORAL_TABLET | Freq: Two times a day (BID) | ORAL | Status: DC
  Administered 2017-01-29 – 2017-02-02 (×9): 12.5 mg via ORAL
  Filled 2017-01-29 (×9): qty 1

## 2017-01-29 MED ORDER — CALCITRIOL 0.25 MCG PO CAPS
0.5000 ug | ORAL_CAPSULE | Freq: Every day | ORAL | Status: DC
  Administered 2017-01-29 – 2017-02-02 (×5): 0.5 ug via ORAL
  Filled 2017-01-29 (×6): qty 2

## 2017-01-29 MED ORDER — FERROUS SULFATE 325 (65 FE) MG PO TABS
325.0000 mg | ORAL_TABLET | Freq: Every day | ORAL | Status: DC
  Administered 2017-01-29 – 2017-02-01 (×4): 325 mg via ORAL
  Filled 2017-01-29 (×5): qty 1

## 2017-01-29 MED ORDER — ROSUVASTATIN CALCIUM 20 MG PO TABS: 40.0000 mg | ORAL_TABLET | Freq: Every day | ORAL | Status: DC

## 2017-01-29 MED ORDER — FLUTICASONE PROPIONATE 50 MCG/ACT NA SUSP
1.0000 | Freq: Every day | NASAL | Status: DC
  Administered 2017-01-29 – 2017-02-02 (×5): 1 via NASAL
  Filled 2017-01-29 (×2): qty 16

## 2017-01-29 MED ORDER — INSULIN GLARGINE 100 UNIT/ML ~~LOC~~ SOLN: 15.0000 [IU] | Freq: Every day | SUBCUTANEOUS | Status: DC

## 2017-01-29 MED ORDER — INSULIN GLARGINE 100 UNIT/ML ~~LOC~~ SOLN
15.0000 [IU] | Freq: Every day | SUBCUTANEOUS | Status: DC
  Filled 2017-01-29: qty 0.15

## 2017-01-29 MED ORDER — LORATADINE 10 MG PO TABS: 10.0000 mg | ORAL_TABLET | Freq: Every day | ORAL | Status: DC | PRN

## 2017-01-29 MED ORDER — LATANOPROST 0.005 % OP SOLN
1.0000 [drp] | Freq: Every day | OPHTHALMIC | Status: DC
  Administered 2017-01-29 – 2017-02-01 (×3): 1 [drp] via OPHTHALMIC
  Filled 2017-01-29: qty 2.5

## 2017-01-29 MED ORDER — ORAL CARE MOUTH RINSE
15.0000 mL | Freq: Two times a day (BID) | OROMUCOSAL | Status: DC
  Administered 2017-01-29 – 2017-02-02 (×8): 15 mL via OROMUCOSAL

## 2017-01-29 MED ORDER — ROSUVASTATIN CALCIUM 10 MG PO TABS
10.0000 mg | ORAL_TABLET | Freq: Every day | ORAL | Status: DC
  Administered 2017-01-29 – 2017-02-01 (×4): 10 mg via ORAL
  Filled 2017-01-29 (×4): qty 1

## 2017-01-29 MED ORDER — PANTOPRAZOLE SODIUM 40 MG PO TBEC
40.0000 mg | DELAYED_RELEASE_TABLET | Freq: Every day | ORAL | Status: DC
  Administered 2017-01-29 – 2017-02-02 (×5): 40 mg via ORAL
  Filled 2017-01-29 (×5): qty 1

## 2017-01-29 MED ORDER — NITROGLYCERIN 0.4 MG SL SUBL: 0.4000 mg | SUBLINGUAL_TABLET | SUBLINGUAL | Status: DC | PRN

## 2017-01-29 MED ORDER — ALBUTEROL SULFATE (2.5 MG/3ML) 0.083% IN NEBU
2.5000 mg | INHALATION_SOLUTION | Freq: Four times a day (QID) | RESPIRATORY_TRACT | Status: DC | PRN
  Administered 2017-01-29: 2.5 mg via RESPIRATORY_TRACT
  Filled 2017-01-29: qty 3

## 2017-01-29 MED ORDER — GLUCAGON HCL RDNA (DIAGNOSTIC) 1 MG IJ SOLR
1.0000 mg | Freq: Once | INTRAMUSCULAR | Status: AC
  Administered 2017-01-29: 1 mg via SUBCUTANEOUS
  Filled 2017-01-29: qty 1

## 2017-01-29 MED ORDER — CYCLOBENZAPRINE HCL 10 MG PO TABS: 5.0000 mg | ORAL_TABLET | Freq: Every day | ORAL | Status: DC | PRN

## 2017-01-29 MED ORDER — ALPRAZOLAM 0.5 MG PO TABS
0.5000 mg | ORAL_TABLET | Freq: Every evening | ORAL | Status: DC | PRN
  Administered 2017-01-30 – 2017-01-31 (×2): 0.5 mg via ORAL
  Filled 2017-01-29 (×2): qty 1

## 2017-01-29 NOTE — H&P (Signed)
History and Physical    [name] WGY:[id number] DOB: [birth year] DOA: 01/28/2017  PCP: [name], MD   Patient coming from: Home.   I have personally briefly reviewed patient's old medical records in [place]  Chief Complaint: Weakness.  HPI: [name] is a 71 y.o. female with medical history significant of allergic rhinitis, anemia, anxiety, depression, CAD, coronary artery disease, chronic combined systolic and diastolic CHF, chronic constipation, hypertension, GERD, hyperlipidemia, chronic back pain, osteoarthritis, type 2 diabetes, ESRD on hemodialysis, recently diagnosed with atrial fibrillation in September who is coming to the emergency department with complaints of worsening chronic generalized weakness.  Her metoprolol was apparently increased from 25-100 mg p.o. daily.  She is also taking amiodarone 200 mg p.o.  She denies chest pain, palpitations, diaphoresis, PND orthopnea, pitting edema of the lower extremities, she recently lost her husband.  His funeral was yesterday.  ED Course: Initial vital signs temperature 35.8C, pulse 41, blood pressure 88/29 and O2 sat 92% on nasal cannula oxygen.  Workup in the emergency department show an EKG changing from sinus bradycardia to junctional bradycardia in the high 30s and low 40s.  Troponin #1 was 0.09, but the patient has chronic elevated troponin due to ESRD.  WBC 6.1, hemoglobin 10.7 g/dL and platelets 214.  Differential was normal.  PT was 36.1 seconds and INR 3.65.  Magnesium was 2.1, glucose 390 mg, BUN 46 creatinine 5.51 calcium 9.3 mg/dL.  Sodium 133, potassium 4.6, chloride 91 CO2 25 mmol/L.   Review of Systems: As per HPI otherwise 10 point review of systems negative.    Past Medical History:  Diagnosis Date  . Acute renal failure superimposed on stage 4 chronic kidney disease ([place]) 07/04/2015  . Allergic rhinitis   . Anemia   . Anxiety   . CAD in native artery    NSTEMI 07/2015 s/p DES to  RCA and posterior PDA, PCI 10/2015 with scoring balloon to 85% ISR of distal RCA), known LAD/Cx disease treated medically  . Carotid artery disease ([place])    Mild bilateral carotid disease (1-39% 07/2015)  . Chronic combined systolic and diastolic CHF (congestive heart failure) ([place])   . CKD (chronic kidney disease), stage IV ([place])   . Constipation   . Essential hypertension   . GERD (gastroesophageal reflux disease)   . History of hysterectomy   . Hyperlipidemia   . Low back pain   . Obesity   . 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 Osteoarthritis   . Overactive bladder   . PVC's (premature ventricular contractions)   . Type 2 diabetes mellitus ([place])     Past Surgical History:  Procedure Laterality Date  . ABDOMINAL HYSTERECTOMY    . AV FISTULA PLACEMENT Left 12/30/2016   Procedure: insertion of left upper arm gortex GRAFT;  Surgeon: Angelia Mould, MD;  [facility] OR;  Service: Vascular;  Laterality: Left;  . BACK SURGERY     multiple  . CARDIAC CATHETERIZATION  04/04/2006   Est EF of 60%  . CARDIAC CATHETERIZATION N/A 07/04/2015   Procedure: Left Heart Cath and Coronary Angiography;  Surgeon: Troy Sine, MD;  Location: [place] CV LAB;  Service: Cardiovascular;  Laterality: N/A;  . CARDIAC CATHETERIZATION N/A 07/04/2015   Procedure: Coronary Stent Intervention;  Surgeon: Troy Sine, MD;  Location: [place] CV LAB;  Service: Cardiovascular;  Laterality: N/A;  . CARDIAC CATHETERIZATION N/A  10/13/2015   Procedure: Left Heart Cath and Coronary Angiography;  Surgeon: Troy Sine, MD;  Location: [place] CV LAB;  Service: Cardiovascular;  Laterality: N/A;  . CARDIAC CATHETERIZATION N/A 12/02/2015   Procedure: Left Heart Cath and Coronary Angiography;  Surgeon: Peter M Martinique, MD;  Location: [place] [facility];  Service: Cardiovascular;  Laterality: N/A;  . CARPAL TUNNEL RELEASE    .  CERVICAL BIOPSY     cervical lymph node biopsies  . COLONOSCOPY  May 2002   Dr. Irving Shows :Followup in 5 years, normal exam  . COLONOSCOPY  2008   Dr. Laural Golden: Very redundant colon with mild melanosis coli, splenic flexure polyp biopsy with acute complaint of benign colon polyp. Recommended ten-year followup  . CORONARY STENT PLACEMENT  04/11/2006   2 -- Taxus stents to the circumflex   . IR FLUORO GUIDE CV LINE RIGHT  01/07/2017  . IR FLUORO GUIDE CV LINE RIGHT  01/16/2017  . IR US GUIDE VASC ACCESS RIGHT  01/07/2017  . IR US GUIDE VASC ACCESS RIGHT  01/16/2017  . PERIPHERAL VASCULAR CATHETERIZATION Right 07/09/2015   Procedure: Upper Extremity Angiography;  Surgeon: Conrad Uinta, MD;  Location: [place] CV LAB;  Service: Cardiovascular;  Laterality: Right;  . PERIPHERAL VASCULAR CATHETERIZATION Right 07/10/2015   Procedure: RIGHT SUBCLAVIAN ARTERY THROMBECTOMY;  Surgeon: Serafina Mitchell, MD;  Location: [facility] OR;  Service: Vascular;  Laterality: Right;  . Tendonitis     bilateral elbow  . TRIGGER FINGER RELEASE       reports that  has never smoked. she has never used smokeless tobacco. She reports that she does not drink alcohol or use drugs.  Allergies  Allergen Reactions  . Ace Inhibitors Cough  . Amlodipine Swelling    UNSPECIFIED EDEMA   . Codeine Rash    Family History  Problem Relation Age of Onset  . Diabetes Father   . Hypertension Father   . Stroke Father   . Hypertension Brother   . Aneurysm Brother   . Diabetes Brother   . Colon cancer Neg Hx     Prior to Admission medications   Medication Sig Start Date End Date Taking? Authorizing Provider  albuterol (PROVENTIL HFA;VENTOLIN HFA) 108 (90 Base) MCG/ACT inhaler Inhale 1-2 puffs into the lungs every 6 (six) hours as needed for wheezing or shortness of breath. 04/01/16  Yes Nanavati, Ankit, MD  albuterol (PROVENTIL) (2.5 MG/3ML) 0.083% nebulizer solution Take 3 mLs (2.5 mg total) by nebulization every 6 (six) hours as  needed for wheezing or shortness of breath. 07/16/16  Yes Elgergawy, Silver Huguenin, MD  ALPRAZolam Duanne Moron) 0.5 MG tablet Take 0.5 mg by mouth at bedtime.   Yes [provider]  amiodarone (PACERONE) 200 MG tablet Take 1 tablet (200 mg total) daily by mouth. 01/18/17  Yes Regalado, Belkys A, MD  calcitRIOL (ROCALTROL) 0.5 MCG capsule Take 1 capsule (0.5 mcg total) daily by mouth. 01/18/17  Yes Regalado, Belkys A, MD  cetirizine (ZYRTEC) 10 MG tablet Take 5 mg by mouth daily. 10/24/16  Yes [provider]  clopidogrel (PLAVIX) 75 MG tablet Take 1 tablet (75 mg total) by mouth daily. 12/13/16  Yes Georgette Shell, MD  cyclobenzaprine (FLEXERIL) 5 MG tablet Take 5 mg by mouth as needed (lower back pain). osteoporosis with out current pathological fracture for 14 days 10/03/16  Yes [provider]  diazepam (VALIUM) 5 MG tablet Take 1 tablet (5 mg total) by mouth every 12 (twelve)  hours as needed for muscle spasms. 10/29/16  Yes Julianne Rice, MD  ferrous sulfate 325 (65 FE) MG tablet Take 325 mg by mouth daily with breakfast.    Yes [provider]  fluticasone (FLONASE) 50 MCG/ACT nasal spray Place 1 spray into both nostrils daily.   Yes [provider]  insulin glargine (LANTUS) 100 UNIT/ML injection Inject 0.15 mLs (15 Units total) at bedtime into the skin. 01/17/17  Yes Regalado, Belkys A, MD  isosorbide mononitrate (IMDUR) 30 MG 24 hr tablet Take 1 tablet (30 mg total) 2 (two) times daily by mouth. 01/17/17  Yes Regalado, Belkys A, MD  latanoprost (XALATAN) 0.005 % ophthalmic solution Place 1 drop into both eyes at bedtime.    Yes [provider]  meclizine (ANTIVERT) 25 MG tablet Take 12.5 mg by mouth 2 (two) times daily.    Yes [provider]  metoprolol succinate (TOPROL-XL) 25 MG 24 hr tablet Take 1 tablet (25 mg total) at bedtime by mouth. 01/17/17  Yes Regalado, Belkys A, MD  nitroGLYCERIN (NITROSTAT) 0.4 MG SL tablet Place 1 tablet  (0.4 mg total) under the tongue every 5 (five) minutes as needed for chest pain. 10/14/15  Yes Simmons, Brittainy M, PA-C  pantoprazole (PROTONIX) 40 MG tablet Take 40 mg by mouth daily.    Yes [provider]  rosuvastatin (CRESTOR) 40 MG tablet Take 1 tablet (40 mg total) by mouth at bedtime. 07/12/15  Yes Lily Kocher, MD  warfarin (COUMADIN) 7.5 MG tablet Take 1 tablet (7.5 mg total) daily at 6 PM by mouth. 01/17/17  Yes Regalado, Cassie Freer, MD    Physical Exam: Vitals:   01/29/17 0204 01/29/17 0300 01/29/17 0400 01/29/17 0500  BP: (!) 98/49 (!) 92/46 (!) 106/48 (!) 120/46  Pulse: 66 (!) 40    Resp: 20 (!) 27 20 17   Temp:  97.9 F (36.6 C)    TempSrc:  Oral    SpO2: 98% 100% 100% 100%  Weight:  86.1 kg (189 lb 13.1 oz)    Height:  5\' 8"  (1.727 m)      Constitutional: NAD, calm, comfortable Eyes: PERRL, lids and conjunctivae normal ENMT: Mucous membranes are moist. Posterior pharynx clear of any exudate or lesions. Neck: normal, supple, no masses, no thyromegaly Respiratory: Decreased breath sounds on bases with mild wheezing, no crackles. Normal respiratory effort. No accessory muscle use.  Cardiovascular: Bradycardic at 40 ppm, no murmurs / rubs / gallops.  1+ lower extremities edema. 2+ pedal pulses. No carotid bruits.  Left AV fistula with positive thrill. Abdomen: Soft, no tenderness, no masses palpated. No hepatosplenomegaly. Bowel sounds positive.  Musculoskeletal: no clubbing / cyanosis. Good ROM, no contractures. Normal muscle tone.  Skin: Right pretibial ulcer and left calf ulcer present.  Please see below pictures for further details. Neurologic: CN 2-12 grossly intact. Sensation intact, DTR normal. Strength 5/5 in all 4.  Psychiatric: Normal judgment and insight. Alert and oriented x 3. Normal mood.         Labs on Admission: I have personally reviewed following labs and imaging studies  CBC: Recent Labs  Lab 01/29/17 0017  WBC 6.1  NEUTROABS 4.7  HGB  10.7*  HCT 37.2  MCV 99.2  PLT 191   Basic Metabolic Panel: Recent Labs  Lab 01/29/17 0017  NA 133*  K 4.6  CL 91*  CO2 25  GLUCOSE 390*  BUN 46*  CREATININE 5.51*  CALCIUM 9.3  MG 2.1   GFR: Estimated Creatinine Clearance:  10.6 mL/min (A) (by C-G formula based on SCr of 5.51 mg/dL (H)). Liver Function Tests: No results for input(s): AST, ALT, ALKPHOS, BILITOT, PROT, ALBUMIN in the last 168 hours. No results for input(s): LIPASE, AMYLASE in the last 168 hours. No results for input(s): AMMONIA in the last 168 hours. Coagulation Profile: Recent Labs  Lab 01/23/17 01/24/17 01/29/17 0017  INR 8.0 8.0*  >8.0 3.65   Cardiac Enzymes: No results for input(s): CKTOTAL, CKMB, CKMBINDEX, TROPONINI in the last 168 hours. BNP (last 3 results) No results for input(s): PROBNP in the last 8760 hours. HbA1C: No results for input(s): HGBA1C in the last 72 hours. CBG: No results for input(s): GLUCAP in the last 168 hours. Lipid Profile: No results for input(s): CHOL, HDL, LDLCALC, TRIG, CHOLHDL, LDLDIRECT in the last 72 hours. Thyroid Function Tests: No results for input(s): TSH, T4TOTAL, FREET4, T3FREE, THYROIDAB in the last 72 hours. Anemia Panel: No results for input(s): VITAMINB12, FOLATE, FERRITIN, TIBC, IRON, RETICCTPCT in the last 72 hours. Urine analysis:    Component Value Date/Time   COLORURINE YELLOW 12/23/2016 1810   APPEARANCEUR CLEAR 12/23/2016 1810   LABSPEC 1.009 12/23/2016 1810   PHURINE 5.0 12/23/2016 1810   GLUCOSEU NEGATIVE 12/23/2016 1810   HGBUR SMALL (A) 12/23/2016 1810   BILIRUBINUR NEGATIVE 12/23/2016 [place] 12/23/2016 1810   PROTEINUR NEGATIVE 12/23/2016 1810   UROBILINOGEN 0.2 08/23/2007 2037   NITRITE NEGATIVE 12/23/2016 1810   LEUKOCYTESUR LARGE (A) 12/23/2016 1810    Radiological Exams on Admission: Dg Chest Port 1 View  Result Date: 01/29/2017 CLINICAL DATA:  Increasing weakness after dialysis this afternoon. EXAM:  PORTABLE CHEST 1 VIEW COMPARISON:  12/26/2016 FINDINGS: Interval placement of a right central venous catheter with tip over the cavoatrial junction region. No pneumothorax. Cardiac enlargement with pulmonary vascular congestion. Bilateral pleural effusions with basilar atelectasis. No significant change since previous study. IMPRESSION: Cardiac enlargement, pulmonary vascular congestion, bilateral pleural effusions, and basilar atelectasis similar to previous study. Electronically Signed   By: Lucienne Capers M.D.   On: 01/29/2017 01:09   11/11/2016 echocardiogram complete  ------------------------------------------------------------------- LV EF: 30% -   35%  ------------------------------------------------------------------- Indications:      Abnormal EKG 794.31.  ------------------------------------------------------------------- History:   PMH:  Acquired from the patient and from the patient&'s chart.  Murmur.  Atrial fibrillation.  PMH:  NSTEMI (non-ST elevated myocardial infarction) GERD. Bilateral lower extremity edema  Risk factors:  Hypertension. Diabetes mellitus. Dyslipidemia.  ------------------------------------------------------------------- Study Conclusions  - Left ventricle: The cavity size was normal. Wall thickness was   increased in a pattern of moderate LVH. Systolic function was   moderately to severely reduced. The estimated ejection fraction   was in the range of 30% to 35%. Features are consistent with a   pseudonormal left ventricular filling pattern, with concomitant   abnormal relaxation and increased filling pressure (grade 2   diastolic dysfunction). Doppler parameters are consistent with   high ventricular filling pressure. - Regional wall motion abnormality: Hypokinesis of the mid inferior   and basal-mid inferolateral myocardium. - Aortic valve: Mildly calcified annulus. Trileaflet; mildly   thickened leaflets. Valve area (VTI): 1.57 cm^2. Valve  area   (Vmax): 1.57 cm^2. Valve area (Vmean): 1.51 cm^2. - Mitral valve: Mildly calcified annulus. Mildly thickened leaflets   . There was mild regurgitation. - Left atrium: The atrium was severely dilated. - Right atrium: The atrium was moderately dilated. - Pulmonary arteries: Systolic pressure was moderately increased.   PA peak pressure:  44 mm Hg (S).   EKG: Independently reviewed.  Vent. rate 40 BPM PR interval * ms QRS duration 106 ms QT/QTc 602/492 ms P-R-T axes * 30 54 Junctional rhythm Low voltage, extremity leads Minimal ST depression, lateral leads Borderline prolonged QT interval Junctional tachycardia has replaced sinus bradycardia.  Assessment/Plan Principal Problem:   Junctional bradycardia Likely secondary to recent increase in metoprolol. observation/stepdown. Improved after she was given glucagon 1 mg SQ treatment for mild wheezing. Metoprolol and amiodarone have been held. Monitor heart rate. Follow-up troponin trending.  Active Problems:   Hyperlipemia Continue Crestor 40 mg p.o. at bedtime. Monitor LFTs as needed.    Essential hypertension Metoprolol has been held. Monitor blood pressure and heart rate.    Coronary artery disease Beta-blocker on hold. On warfarin. On Crestor. Continue isosorbide mononitrate 30 mg p.o. daily.    GERD Protonix 40 mg p.o. daily.    Type 2 diabetes mellitus with stage 4 chronic kidney disease,     with long-term current use of insulin (HCC) Carbohydrate modified diet. Continue Lantus 15 units SQ at bedtime.    Elevated troponin This is chronic secondary to A. fib and ESRD. Follow-up level trending.    Atrial fibrillation (HCC) CHA?DS?-VASc Score of at least 6. Now in junctional bradycardia. Metoprolol and amiodarone held. On warfarin per pharmacy.    DVT prophylaxis: On warfarin. Code Status: Full code. Family Communication: Her daughter and several other family members were present in her  room. Disposition Plan: Observation in stepdown, troponin level trending, glucagon IV or atropine as needed. Consults called:  Admission status: Observation/telemetry.   Reubin Milan MD [facility] Pager [phone].  If 7PM-7AM, please contact night-coverage [email or web address] Password [facility]  01/29/2017, 6:21 AM

## 2017-01-29 NOTE — Progress Notes (Signed)
ANTICOAGULATION CONSULT NOTE - Initial Consult  Pharmacy Consult for Coumadin (home med) Indication: atrial fibrillation  Allergies  Allergen Reactions  . Ace Inhibitors Cough  . Amlodipine Swelling    UNSPECIFIED EDEMA   . Codeine Rash   Patient Measurements: Height: 5\' 8"  (172.7 cm) Weight: 189 lb 13.1 oz (86.1 kg) IBW/kg (Calculated) : 63.9  Vital Signs: Temp: 98.1 F (36.7 C) (11/25 0800) Temp Source: Oral (11/25 0800) BP: 118/69 (11/25 0800) Pulse Rate: 52 (11/25 0800)  Labs: Recent Labs    01/29/17 0017  HGB 10.7*  HCT 37.2  PLT 214  LABPROT 36.1*  INR 3.65  CREATININE 5.51*   Estimated Creatinine Clearance: 10.6 mL/min (A) (by C-G formula based on SCr of 5.51 mg/dL (H)).  Medical History: Past Medical History:  Diagnosis Date  . Acute renal failure superimposed on stage 4 chronic kidney disease ([place]) 07/04/2015  . Allergic rhinitis   . Anemia   . Anxiety   . CAD in native artery    NSTEMI 07/2015 s/p DES to RCA and posterior PDA, PCI 10/2015 with scoring balloon to 85% ISR of distal RCA), known LAD/Cx disease treated medically  . Carotid artery disease ([place])    Mild bilateral carotid disease (1-39% 07/2015)  . Chronic combined systolic and diastolic CHF (congestive heart failure) ([place])   . CKD (chronic kidney disease), stage IV ([place])   . Constipation   . Essential hypertension   . GERD (gastroesophageal reflux disease)   . History of hysterectomy   . Hyperlipidemia   . Low back pain   . Obesity   . 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 Osteoarthritis   . Overactive bladder   . PVC's (premature ventricular contractions)   . Type 2 diabetes mellitus (HCC)    Medications:  Medications Prior to Admission  Medication Sig Dispense Refill Last Dose  . albuterol (PROVENTIL HFA;VENTOLIN HFA) 108 (90 Base) MCG/ACT inhaler Inhale 1-2 puffs into the lungs every 6 (six) hours as  needed for wheezing or shortness of breath. 1 Inhaler 0 unknnown at prn  . albuterol (PROVENTIL) (2.5 MG/3ML) 0.083% nebulizer solution Take 3 mLs (2.5 mg total) by nebulization every 6 (six) hours as needed for wheezing or shortness of breath. 75 mL 1 unknown at prn  . ALPRAZolam (XANAX) 0.5 MG tablet Take 0.5 mg by mouth at bedtime.   12/22/2016  . amiodarone (PACERONE) 200 MG tablet Take 1 tablet (200 mg total) daily by mouth. 30 tablet 0   . calcitRIOL (ROCALTROL) 0.5 MCG capsule Take 1 capsule (0.5 mcg total) daily by mouth. 30 capsule 0   . cetirizine (ZYRTEC) 10 MG tablet Take 5 mg by mouth daily.  3 12/23/2016  . clopidogrel (PLAVIX) 75 MG tablet Take 1 tablet (75 mg total) by mouth daily. 30 tablet 0 12/23/2016  . cyclobenzaprine (FLEXERIL) 5 MG tablet Take 5 mg by mouth as needed (lower back pain). osteoporosis with out current pathological fracture for 14 days  0 unknown at prn  . diazepam (VALIUM) 5 MG tablet Take 1 tablet (5 mg total) by mouth every 12 (twelve) hours as needed for muscle spasms. 10 tablet 0 unknown at prn  . ferrous sulfate 325 (65 FE) MG tablet Take 325 mg by mouth daily with breakfast.    12/23/2016  . fluticasone (FLONASE) 50 MCG/ACT nasal spray Place 1 spray into both nostrils daily.   12/23/2016  . insulin glargine (LANTUS) 100  UNIT/ML injection Inject 0.15 mLs (15 Units total) at bedtime into the skin. 10 mL 11   . isosorbide mononitrate (IMDUR) 30 MG 24 hr tablet Take 1 tablet (30 mg total) 2 (two) times daily by mouth. 60 tablet 0   . latanoprost (XALATAN) 0.005 % ophthalmic solution Place 1 drop into both eyes at bedtime.    12/22/2016  . meclizine (ANTIVERT) 25 MG tablet Take 12.5 mg by mouth 2 (two) times daily.    12/23/2016  . metoprolol succinate (TOPROL-XL) 25 MG 24 hr tablet Take 1 tablet (25 mg total) at bedtime by mouth. 30 tablet 0   . nitroGLYCERIN (NITROSTAT) 0.4 MG SL tablet Place 1 tablet (0.4 mg total) under the tongue every 5 (five) minutes as  needed for chest pain. 25 tablet 2 unknown at prn  . pantoprazole (PROTONIX) 40 MG tablet Take 40 mg by mouth daily.    12/23/2016  . rosuvastatin (CRESTOR) 40 MG tablet Take 1 tablet (40 mg total) by mouth at bedtime. 30 tablet 0 12/22/2016  . warfarin (COUMADIN) 7.5 MG tablet Take 1 tablet (7.5 mg total) daily at 6 PM by mouth. 30 tablet 0     Assessment: 71yo female on chronic Coumadin for h/o afib.  INR SUPRAtherapeutic on admission.   Goal of Therapy:  INR 2-3 Monitor platelets by anticoagulation protocol: Yes   Plan:  HOLD coumadin today due to elevated INR Allow INR to trend down Check INR daily  [place] Crane, Jessicia Napolitano A 01/29/2017,9:17 AM

## 2017-01-29 NOTE — ED Provider Notes (Signed)
[facility] EMERGENCY DEPARTMENT Provider Note   CSN: [id number] Arrival date & time: 01/28/17  2321     History   Chief Complaint Chief Complaint  Patient presents with  . Weakness    HPI [name] is a 71 y.o. female.  The history is provided by the patient.  She has history of end-stage renal disease on hemodialysis, combined systolic and diastolic heart failure, diabetes, hypertension, coronary artery disease. She complains of generalized weakness and fatigue for the last three-four days, getting worse. She denies chest discomfort, nausea, fever, diaphoresis. She did have her dialysis today. Of note, she had her dose of metoprolol increased from 25 mg a day to 100 mg a day during a recent hospitalization. She is anticoagulated on warfarin, and has held her dose for the last three days because of an elevated INR.  Past Medical History:  Diagnosis Date  . Acute renal failure superimposed on stage 4 chronic kidney disease ([place]) 07/04/2015  . Allergic rhinitis   . Anemia   . Anxiety   . CAD in native artery    NSTEMI 07/2015 s/p DES to RCA and posterior PDA, PCI 10/2015 with scoring balloon to 85% ISR of distal RCA), known LAD/Cx disease treated medically  . Carotid artery disease ([place])    Mild bilateral carotid disease (1-39% 07/2015)  . Chronic combined systolic and diastolic CHF (congestive heart failure) ([place])   . CKD (chronic kidney disease), stage IV ([place])   . Constipation   . Essential hypertension   . GERD (gastroesophageal reflux disease)   . History of hysterectomy   . Hyperlipidemia   . Low back pain   . Obesity   . 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 Osteoarthritis   . Overactive bladder   . PVC's (premature ventricular contractions)   . Type 2 diabetes mellitus [facility])     Patient Active Problem List   Diagnosis Date Noted  . Encounter for therapeutic drug monitoring  01/19/2017  . Pressure injury of skin 01/16/2017  . Rhonchi   . Hypervolemia   . Hypokalemia 12/23/2016  . Goals of care, counseling/discussion   . Palliative care encounter   . Hypercoagulopathy ([place]) 12/03/2016  . Acute on chronic heart failure ([place]) 12/03/2016  . Ischemic cardiomyopathy   . Generalized weakness   . Atrial fibrillation with RVR ([place]) 11/19/2016  . Abnormal nuclear stress test   . Left ventricular dysfunction   . Atrial fibrillation ([place]) 11/13/2016  . Anemia in chronic kidney disease   . Atrial fibrillation, new onset ([place]) 11/10/2016  . Hypoglycemia due to insulin 11/10/2016  . Pleural effusion on right 10/31/2016  . HCAP (healthcare-associated pneumonia) 10/31/2016  . DM type 2 causing vascular disease ([place]) 10/05/2016  . Personal history of noncompliance with medical treatment, presenting hazards to health 08/22/2016  . Hyperglycemia 08/07/2016  . Pulmonary edema 07/14/2016  . CKD (chronic kidney disease), stage IV ([place]) 07/14/2016  . Acute respiratory failure with hypoxia ([place]) 07/09/2016  . Acute on chronic systolic CHF (congestive heart failure) ([place]) 12/01/2015  . AKI (acute kidney injury) ([place])   . Ischemia of upper extremity   . Right knee pain   . Subclavian artery stenosis, right ([place]) 07/07/2015  . Acute on chronic combined systolic and diastolic CHF (congestive heart failure) ([place]) 07/04/2015  . Elevated troponin 07/04/2015  . Acute renal failure superimposed on chronic kidney disease ([place]) 07/04/2015  . Non-ST elevation (NSTEMI)  myocardial infarction ([place]) 07/04/2015  . Elevated d-dimer 07/04/2015  . Acute respiratory failure with hypercapnia ([place])   . Bilateral lower extremity edema 01/20/2012  . Type 2 diabetes mellitus with stage 4 chronic kidney disease, with long-term current use of insulin ([place]) 01/21/2011  . Overweight 07/20/2009  . Coronary artery disease 10/08/2008  . HEART MURMUR, SYSTOLIC 15/17/6160  . SHOULDER PAIN 02/05/2007  .  Hyperlipemia 04/11/2006  . Anxiety state 04/11/2006  . SYNDROME, CARPAL TUNNEL 04/11/2006  . Essential hypertension 04/11/2006  . ALLERGIC RHINITIS 04/11/2006  . GERD 04/11/2006  . Constipation 04/11/2006  . OVERACTIVE BLADDER 04/11/2006  . OSTEOARTHRITIS 04/11/2006  . LOW BACK PAIN 04/11/2006    Past Surgical History:  Procedure Laterality Date  . ABDOMINAL HYSTERECTOMY    . AV FISTULA PLACEMENT Left 12/30/2016   Procedure: insertion of left upper arm gortex GRAFT;  Surgeon: Angelia Mould, MD;  Location: [facility] OR;  Service: Vascular;  Laterality: Left;  . BACK SURGERY     multiple  . CARDIAC CATHETERIZATION  04/04/2006   Est EF of 60%  . CARDIAC CATHETERIZATION N/A 07/04/2015   Procedure: Left Heart Cath and Coronary Angiography;  Surgeon: Troy Sine, MD;  Location: [place] CV LAB;  Service: Cardiovascular;  Laterality: N/A;  . CARDIAC CATHETERIZATION N/A 07/04/2015   Procedure: Coronary Stent Intervention;  Surgeon: Troy Sine, MD;  Location: [place] CV LAB;  Service: Cardiovascular;  Laterality: N/A;  . CARDIAC CATHETERIZATION N/A 10/13/2015   Procedure: Left Heart Cath and Coronary Angiography;  Surgeon: Troy Sine, MD;  Location: [place] CV LAB;  Service: Cardiovascular;  Laterality: N/A;  . CARDIAC CATHETERIZATION N/A 12/02/2015   Procedure: Left Heart Cath and Coronary Angiography;  Surgeon: Peter M Martinique, MD;  Location: [place] CV LAB;  Service: Cardiovascular;  Laterality: N/A;  . CARPAL TUNNEL RELEASE    . CERVICAL BIOPSY     cervical lymph node biopsies  . COLONOSCOPY  May 2002   Dr. Irving Shows :Followup in 5 years, normal exam  . COLONOSCOPY  2008   Dr. Laural Golden: Very redundant colon with mild melanosis coli, splenic flexure polyp biopsy with acute complaint of benign colon polyp. Recommended ten-year followup  . CORONARY STENT PLACEMENT  04/11/2006   2 -- Taxus stents to the circumflex   . IR FLUORO GUIDE CV LINE RIGHT  01/07/2017  . IR  FLUORO GUIDE CV LINE RIGHT  01/16/2017  . IR US GUIDE VASC ACCESS RIGHT  01/07/2017  . IR US GUIDE VASC ACCESS RIGHT  01/16/2017  . PERIPHERAL VASCULAR CATHETERIZATION Right 07/09/2015   Procedure: Upper Extremity Angiography;  Surgeon: Conrad [place], MD;  Location: [place] CV LAB;  Service: Cardiovascular;  Laterality: Right;  . PERIPHERAL VASCULAR CATHETERIZATION Right 07/10/2015   Procedure: RIGHT SUBCLAVIAN ARTERY THROMBECTOMY;  Surgeon: Serafina Mitchell, MD;  Location: [facility] OR;  Service: Vascular;  Laterality: Right;  . Tendonitis     bilateral elbow  . TRIGGER FINGER RELEASE      OB History    No data available       Home Medications    Prior to Admission medications   Medication Sig Start Date End Date Taking? Authorizing Provider  albuterol (PROVENTIL HFA;VENTOLIN HFA) 108 (90 Base) MCG/ACT inhaler Inhale 1-2 puffs into the lungs every 6 (six) hours as needed for wheezing or shortness of breath. 04/01/16  Yes Nanavati, Ankit, MD  albuterol (PROVENTIL) (2.5 MG/3ML) 0.083% nebulizer solution Take 3 mLs (2.5 mg total)  by nebulization every 6 (six) hours as needed for wheezing or shortness of breath. 07/16/16  Yes Elgergawy, Silver Huguenin, MD  ALPRAZolam Duanne Moron) 0.5 MG tablet Take 0.5 mg by mouth at bedtime.   Yes [provider]  amiodarone (PACERONE) 200 MG tablet Take 1 tablet (200 mg total) daily by mouth. 01/18/17  Yes Regalado, Belkys A, MD  calcitRIOL (ROCALTROL) 0.5 MCG capsule Take 1 capsule (0.5 mcg total) daily by mouth. 01/18/17  Yes Regalado, Belkys A, MD  cetirizine (ZYRTEC) 10 MG tablet Take 5 mg by mouth daily. 10/24/16  Yes [provider]  clopidogrel (PLAVIX) 75 MG tablet Take 1 tablet (75 mg total) by mouth daily. 12/13/16  Yes Georgette Shell, MD  cyclobenzaprine (FLEXERIL) 5 MG tablet Take 5 mg by mouth as needed (lower back pain). osteoporosis with out current pathological fracture for 14 days 10/03/16  Yes [provider]  diazepam (VALIUM) 5  MG tablet Take 1 tablet (5 mg total) by mouth every 12 (twelve) hours as needed for muscle spasms. 10/29/16  Yes Julianne Rice, MD  ferrous sulfate 325 (65 FE) MG tablet Take 325 mg by mouth daily with breakfast.    Yes [provider]  fluticasone (FLONASE) 50 MCG/ACT nasal spray Place 1 spray into both nostrils daily.   Yes [provider]  insulin glargine (LANTUS) 100 UNIT/ML injection Inject 0.15 mLs (15 Units total) at bedtime into the skin. 01/17/17  Yes Regalado, Belkys A, MD  isosorbide mononitrate (IMDUR) 30 MG 24 hr tablet Take 1 tablet (30 mg total) 2 (two) times daily by mouth. 01/17/17  Yes Regalado, Belkys A, MD  latanoprost (XALATAN) 0.005 % ophthalmic solution Place 1 drop into both eyes at bedtime.    Yes [provider]  meclizine (ANTIVERT) 25 MG tablet Take 12.5 mg by mouth 2 (two) times daily.    Yes [provider]  metoprolol succinate (TOPROL-XL) 25 MG 24 hr tablet Take 1 tablet (25 mg total) at bedtime by mouth. 01/17/17  Yes Regalado, Belkys A, MD  nitroGLYCERIN (NITROSTAT) 0.4 MG SL tablet Place 1 tablet (0.4 mg total) under the tongue every 5 (five) minutes as needed for chest pain. 10/14/15  Yes Simmons, Brittainy M, PA-C  pantoprazole (PROTONIX) 40 MG tablet Take 40 mg by mouth daily.    Yes [provider]  rosuvastatin (CRESTOR) 40 MG tablet Take 1 tablet (40 mg total) by mouth at bedtime. 07/12/15  Yes Lily Kocher, MD  warfarin (COUMADIN) 7.5 MG tablet Take 1 tablet (7.5 mg total) daily at 6 PM by mouth. 01/17/17  Yes Regalado, Cassie Freer, MD    Family History Family History  Problem Relation Age of Onset  . Diabetes Father   . Hypertension Father   . Stroke Father   . Hypertension Brother   . Aneurysm Brother   . Diabetes Brother   . Colon cancer Neg Hx     Social History Social History   Tobacco Use  . Smoking status: Never Smoker  . Smokeless tobacco: Never Used  Substance Use Topics  . Alcohol use: No     Alcohol/week: 0.0 oz  . Drug use: No     Allergies   Ace inhibitors; Amlodipine; and Codeine   Review of Systems Review of Systems  All other systems reviewed and are negative.    Physical Exam Updated Vital Signs BP (!) 90/49   Pulse (!) 41   Temp (!) 96.5 F (35.8 C) (Tympanic)   Ht 5'  8" (1.727 m)   Wt 83 kg (183 lb)   BMI 27.83 kg/m   Physical Exam  Nursing note and vitals reviewed.  71 year old female, resting comfortably and in no acute distress. Vital signs are significant for bradycardia, hypotension. Oxygen saturation is 93%, which is normal. Head is normocephalic and atraumatic. PERRLA, EOMI. Oropharynx is clear. Neck is nontender and supple without adenopathy or JVD. Back is nontender and there is no CVA tenderness. Lungs are clear without rales, wheezes, or rhonchi. Chest is nontender. Dialysis catheters present on the right. Heart has regular rate and rhythm without murmur. Abdomen is soft, flat, nontender without masses or hepatosplenomegaly and peristalsis is normoactive. Extremities have 2+ edema, full range of motion is present. There is an AV graft in the left upper arm with bruit present. Skin is warm and dry without rash. Neurologic: Mental status is normal, cranial nerves are intact, there are no motor or sensory deficits.  ED Treatments / Results  Labs (all labs ordered are listed, but only abnormal results are displayed) Labs Reviewed  BASIC METABOLIC PANEL - Abnormal; Notable for the following components:      Result Value   Sodium 133 (*)    Chloride 91 (*)    Glucose, Bld 390 (*)    BUN 46 (*)    Creatinine, Ser 5.51 (*)    GFR calc non Af Amer 7 (*)    GFR calc Af Amer 8 (*)    Anion gap 17 (*)    All other components within normal limits  CBC WITH DIFFERENTIAL/PLATELET - Abnormal; Notable for the following components:   RBC 3.75 (*)    Hemoglobin 10.7 (*)    MCHC 28.8 (*)    RDW 20.0 (*)    All other components within normal  limits  PROTIME-INR - Abnormal; Notable for the following components:   Prothrombin Time 36.1 (*)    All other components within normal limits  I-STAT TROPONIN, ED - Abnormal; Notable for the following components:   Troponin i, poc 0.09 (*)    All other components within normal limits  MAGNESIUM  TROPONIN I  TROPONIN I    EKG  EKG Interpretation  Date/Time:  Sunday January 29 2017 00:03:51 EST Ventricular Rate:  40 PR Interval:    QRS Duration: 106 QT Interval:  602 QTC Calculation: 492 R Axis:   30 Text Interpretation:  Junctional rhythm Low voltage, extremity leads Minimal ST depression, lateral leads Borderline prolonged QT interval When compared with ECG of 12/23/2016, Junctional bradycardia has replaced Sinus bradycardia HEART RATE has decreased QT has shortened Confirmed by Delora Fuel ([id number]) on 01/29/2017 12:08:38 AM       Radiology Dg Chest Port 1 View  Result Date: 01/29/2017 CLINICAL DATA:  Increasing weakness after dialysis this afternoon. EXAM: PORTABLE CHEST 1 VIEW COMPARISON:  12/26/2016 FINDINGS: Interval placement of a right central venous catheter with tip over the cavoatrial junction region. No pneumothorax. Cardiac enlargement with pulmonary vascular congestion. Bilateral pleural effusions with basilar atelectasis. No significant change since previous study. IMPRESSION: Cardiac enlargement, pulmonary vascular congestion, bilateral pleural effusions, and basilar atelectasis similar to previous study. Electronically Signed   By: Lucienne Capers M.D.   On: 01/29/2017 01:09    Procedures Procedures (including critical care time) CRITICAL CARE Performed by: Delora Fuel Total critical care time: 60 minutes Critical care time was exclusive of separately billable procedures and treating other patients. Critical care was necessary to treat or prevent imminent or life-threatening  deterioration. Critical care was time spent personally by me on the following  activities: development of treatment plan with patient and/or surrogate as well as nursing, discussions with consultants, evaluation of patient's response to treatment, examination of patient, obtaining history from patient or surrogate, ordering and performing treatments and interventions, ordering and review of laboratory studies, ordering and review of radiographic studies, pulse oximetry and re-evaluation of patient's condition.  Medications Ordered in ED Medications  pantoprazole (PROTONIX) EC tablet 40 mg (not administered)  nitroGLYCERIN (NITROSTAT) SL tablet 0.4 mg (not administered)  isosorbide mononitrate (IMDUR) 24 hr tablet 30 mg (30 mg Oral Not Given 01/29/17 0401)  latanoprost (XALATAN) 0.005 % ophthalmic solution 1 drop (not administered)  meclizine (ANTIVERT) tablet 12.5 mg (not administered)  rosuvastatin (CRESTOR) tablet 40 mg (not administered)  albuterol (PROVENTIL) (2.5 MG/3ML) 0.083% nebulizer solution 2.5 mg (2.5 mg Nebulization Given 01/29/17 0346)  ALPRAZolam (XANAX) tablet 0.5 mg (not administered)  calcitRIOL (ROCALTROL) capsule 0.5 mcg (not administered)  loratadine (CLARITIN) tablet 10 mg (not administered)  cyclobenzaprine (FLEXERIL) tablet 5 mg (not administered)  ferrous sulfate tablet 325 mg (not administered)  fluticasone (FLONASE) 50 MCG/ACT nasal spray 1 spray (not administered)  insulin aspart (novoLOG) injection 0-9 Units (not administered)  MEDLINE mouth rinse (not administered)  glucagon (human recombinant) (GLUCAGEN) injection 1 mg (1 mg Subcutaneous Given 01/29/17 0400)     Initial Impression / Assessment and Plan / ED Course  I have reviewed the triage vital signs and the nursing notes.  Pertinent labs & imaging results that were available during my care of the patient were reviewed by me and considered in my medical decision making (see chart for details).  Weakness and bradycardia - possibly related to increased beta blocker dose. Old records  are reviewed, and she was started on dialysis during an admission one month ago. Current ECG shows junctional bradycardia, compared with sinus bradycardia at 50 beats per minute on previous ECG. She will need to be observed to see if holding the beta blocker improves her heart rate. If not, may need to consider pacemaker.  She remained stable in the ED.  Laboratory workup was significant for findings of renal failure with associated anemia.  Elevation of troponin is noted and had been present previously and is probably related to underlying renal failure.  This will need to be trended.  Heart rate has actually increased to about 60.  Case is discussed with Dr. Olevia Bowens of [facility] who agrees to admit the patient..  Final Clinical Impressions(s) / ED Diagnoses   Final diagnoses:  Junctional bradycardia  End-stage renal disease on hemodialysis ([place])  Anemia associated with chronic renal failure  Elevated troponin I level    ED Discharge Orders    None       Delora Fuel, MD 81/44/81 [phone]

## 2017-01-29 NOTE — Progress Notes (Addendum)
CRITICAL VALUE ALERT  Critical Value:  Troponin 0.10  Date & Time Notied:  01/29/2017 at [place]  Provider Notified: Dr. Jerilee Hoh

## 2017-01-29 NOTE — Progress Notes (Signed)
Patient seen and examined, database reviewed.  Patient admitted earlier today due to weakness and fatigue.  She was found to have bradycardia with a heart rate in the 30s-40s in junctional rhythm.  Important to note that metoprolol was recently increased from 25-100 mg daily.  Metoprolol and amiodarone have both been placed on hold.  She has been a tad hypotensive today so fluid boluses have been ordered.  We will continue to follow.  Domingo Mend, MD [facility] Pager: [phone]

## 2017-01-30 DIAGNOSIS — I10 Essential (primary) hypertension: Secondary | ICD-10-CM

## 2017-01-30 DIAGNOSIS — E1122 Type 2 diabetes mellitus with diabetic chronic kidney disease: Secondary | ICD-10-CM

## 2017-01-30 DIAGNOSIS — K219 Gastro-esophageal reflux disease without esophagitis: Secondary | ICD-10-CM

## 2017-01-30 DIAGNOSIS — E7800 Pure hypercholesterolemia, unspecified: Secondary | ICD-10-CM

## 2017-01-30 DIAGNOSIS — R001 Bradycardia, unspecified: Secondary | ICD-10-CM

## 2017-01-30 DIAGNOSIS — Z794 Long term (current) use of insulin: Secondary | ICD-10-CM

## 2017-01-30 DIAGNOSIS — R748 Abnormal levels of other serum enzymes: Secondary | ICD-10-CM

## 2017-01-30 DIAGNOSIS — N184 Chronic kidney disease, stage 4 (severe): Secondary | ICD-10-CM

## 2017-01-30 DIAGNOSIS — I4891 Unspecified atrial fibrillation: Secondary | ICD-10-CM

## 2017-01-30 LAB — GLUCOSE, CAPILLARY
GLUCOSE-CAPILLARY: 59 mg/dL — AB (ref 65–99)
GLUCOSE-CAPILLARY: 89 mg/dL (ref 65–99)
GLUCOSE-CAPILLARY: 170 mg/dL — AB (ref 65–99)
Glucose-Capillary: 37 mg/dL — CL (ref 65–99)
Glucose-Capillary: 46 mg/dL — ABNORMAL LOW (ref 65–99)
Glucose-Capillary: 161 mg/dL — ABNORMAL HIGH (ref 65–99)
Glucose-Capillary: 122 mg/dL — ABNORMAL HIGH (ref 65–99)

## 2017-01-30 LAB — BASIC METABOLIC PANEL
Anion gap: 16 — ABNORMAL HIGH (ref 5–15)
BUN: 57 mg/dL — ABNORMAL HIGH (ref 6–20)
CALCIUM: 8.8 mg/dL — AB (ref 8.9–10.3)
CHLORIDE: 91 mmol/L — AB (ref 101–111)
CO2: 25 mmol/L (ref 22–32)
CREATININE: 6.57 mg/dL — AB (ref 0.44–1.00)
GFR, EST AFRICAN AMERICAN: 7 mL/min — AB (ref >60)
GFR, EST NON AFRICAN AMERICAN: 6 mL/min — AB (ref >60)
Glucose, Bld: 105 mg/dL — ABNORMAL HIGH (ref 65–99)
Potassium: 5.8 mmol/L — ABNORMAL HIGH (ref 3.5–5.1)
SODIUM: 132 mmol/L — AB (ref 135–145)

## 2017-01-30 LAB — PROTIME-INR
INR: 4.97
Prothrombin Time: 45.9 seconds — ABNORMAL HIGH (ref 11.4–15.2)

## 2017-01-30 LAB — TSH: TSH: 0.894 u[IU]/mL (ref 0.350–4.500)

## 2017-01-30 MED ORDER — DEXTROSE 50 % IV SOLN
INTRAVENOUS | Status: AC
  Filled 2017-01-30: qty 50

## 2017-01-30 NOTE — Consult Note (Signed)
CARDIOLOGY CONSULT NOTE    Patient ID: [name]; [id number]; [birth year]   Admit date: 01/28/2017 Date of Consult: 01/30/2017  Primary Care Provider: [name], MD Primary Cardiologist: Kate Sable, MD   Patient Profile:   [name] is a 71 y.o. female with a hx of chronic combined systolic and diastolic heart failurechronic combined systolic and diastolic heart failure, (recent admission to [facility] From 12/23/2016 to 01/17/2017) from 12/23/2016 to 01/17/2017) in the setting of decompensated heart failure, in the setting of decompensated heart failure, single-vessel occlusive CAD with CTO of the left circumflex, stents to the mid RCA and distal RCA/PDA, PAF on coumadin, CHADS VASC Score of 6,CKD (baseline Cr 3.7 on dialysis) who is being seen today for the evaluation of bradycardia  at the request of Dr. Olevia Bowens, hospitalists service   History of Present Illness:   Ms. Kirt presented to the emergency room after being home from recent admission for approximately 10 days with complaints of worsening chronic generalized weakness. She was found to be bradycardic, junctional rhythm with rate in the 30s and low 40s her EKG. The patient apparently had been on amiodarone and metoprolol 100 mg by mouth daily per H&P. Discharge metoprolol was documented at 25 mg daily.   The patient's family members state that she had a bottle of 100 mg tablets which was prescribed to her in early September, and she continued to take that dose instead of the 25 mg which were prescribed on discharge.   On arrival to ER, BP 88/29, HR 41, she was afebrile. She was taken off of amiodarone and metoprolol on admission. HR and BP have improved. Labs on admission Na 133, K 4.6, Cl 91, Glucose 390, BUN 46, Creatinine 5.51. Hgb 10.7, Hct 37.2. Mg 2.1  CXR, pleural effusion with pulmonary vascular congestion, with bilateral pleural effusions.  She is not currently on diuretics but is due for  hemodialysis in am. Normally seen in [place] for this.    Past Medical History:  Diagnosis Date  . Acute renal failure superimposed on stage 4 chronic kidney disease ([place]) 07/04/2015  . Allergic rhinitis   . Anemia   . Anxiety   . CAD in native artery    NSTEMI 07/2015 s/p DES to RCA and posterior PDA, PCI 10/2015 with scoring balloon to 85% ISR of distal RCA), known LAD/Cx disease treated medically  . Carotid artery disease ([place])    Mild bilateral carotid disease (1-39% 07/2015)  . Chronic combined systolic and diastolic CHF (congestive heart failure) ([place])   . CKD (chronic kidney disease), stage IV ([place])   . Constipation   . Essential hypertension   . GERD (gastroesophageal reflux disease)   . History of hysterectomy   . Hyperlipidemia   . Low back pain   . Obesity   . 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 Osteoarthritis   . Overactive bladder   . PVC's (premature ventricular contractions)   . Type 2 diabetes mellitus ([place])     Past Surgical History:  Procedure Laterality Date  . ABDOMINAL HYSTERECTOMY    . AV FISTULA PLACEMENT Left 12/30/2016   Procedure: insertion of left upper arm gortex GRAFT;  Surgeon: Angelia Mould, MD;  Location[facility] OR;  Service: Vascular;  Laterality: Left;  . BACK SURGERY     multiple  . CARDIAC CATHETERIZATION  04/04/2006   Est EF of 60%  . CARDIAC CATHETERIZATION  N/A 07/04/2015   Procedure: Left Heart Cath and Coronary Angiography;  Surgeon: Troy Sine, MD;  Location: [place] CV LAB;  Service: Cardiovascular;  Laterality: N/A;  . CARDIAC CATHETERIZATION N/A 07/04/2015   Procedure: Coronary Stent Intervention;  Surgeon: Troy Sine, MD;  Location: [place] CV LAB;  Service: Cardiovascular;  Laterality: N/A;  . CARDIAC CATHETERIZATION N/A 10/13/2015   Procedure: Left Heart Cath and Coronary Angiography;  Surgeon: Troy Sine, MD;  Location: [place]  CV LAB;  Service: Cardiovascular;  Laterality: N/A;  . CARDIAC CATHETERIZATION N/A 12/02/2015   Procedure: Left Heart Cath and Coronary Angiography;  Surgeon: Peter M Martinique, MD;  Location: [place] CV LAB;  Service: Cardiovascular;  Laterality: N/A;  . CARPAL TUNNEL RELEASE    . CERVICAL BIOPSY     cervical lymph node biopsies  . COLONOSCOPY  May 2002   Dr. Irving Shows :Followup in 5 years, normal exam  . COLONOSCOPY  2008   Dr. Laural Golden: Very redundant colon with mild melanosis coli, splenic flexure polyp biopsy with acute complaint of benign colon polyp. Recommended ten-year followup  . CORONARY STENT PLACEMENT  04/11/2006   2 -- Taxus stents to the circumflex   . IR FLUORO GUIDE CV LINE RIGHT  01/07/2017  . IR FLUORO GUIDE CV LINE RIGHT  01/16/2017  . IR US GUIDE VASC ACCESS RIGHT  01/07/2017  . IR US GUIDE VASC ACCESS RIGHT  01/16/2017  . PERIPHERAL VASCULAR CATHETERIZATION Right 07/09/2015   Procedure: Upper Extremity Angiography;  Surgeon: Conrad Floyd Hill, MD;  Location: [place] CV LAB;  Service: Cardiovascular;  Laterality: Right;  . PERIPHERAL VASCULAR CATHETERIZATION Right 07/10/2015   Procedure: RIGHT SUBCLAVIAN ARTERY THROMBECTOMY;  Surgeon: Serafina Mitchell, MD;  Location: [facility] OR;  Service: Vascular;  Laterality: Right;  . Tendonitis     bilateral elbow  . TRIGGER FINGER RELEASE       Home Medications:  Prior to Admission medications   Medication Sig Start Date End Date Taking? Authorizing Provider  albuterol (PROVENTIL HFA;VENTOLIN HFA) 108 (90 Base) MCG/ACT inhaler Inhale 1-2 puffs into the lungs every 6 (six) hours as needed for wheezing or shortness of breath. 04/01/16  Yes Nanavati, Ankit, MD  albuterol (PROVENTIL) (2.5 MG/3ML) 0.083% nebulizer solution Take 3 mLs (2.5 mg total) by nebulization every 6 (six) hours as needed for wheezing or shortness of breath. 07/16/16  Yes Elgergawy, Silver Huguenin, MD  ALPRAZolam Duanne Moron) 0.5 MG tablet Take 0.5 mg by mouth at bedtime.   Yes  [provider]  amiodarone (PACERONE) 200 MG tablet Take 1 tablet (200 mg total) daily by mouth. 01/18/17  Yes Regalado, Belkys A, MD  calcitRIOL (ROCALTROL) 0.5 MCG capsule Take 1 capsule (0.5 mcg total) daily by mouth. 01/18/17  Yes Regalado, Belkys A, MD  cetirizine (ZYRTEC) 10 MG tablet Take 5 mg by mouth daily. 10/24/16  Yes [provider]  clopidogrel (PLAVIX) 75 MG tablet Take 1 tablet (75 mg total) by mouth daily. 12/13/16  Yes Georgette Shell, MD  cyclobenzaprine (FLEXERIL) 5 MG tablet Take 5 mg by mouth as needed (lower back pain). osteoporosis with out current pathological fracture for 14 days 10/03/16  Yes [provider]  ferrous sulfate 325 (65 FE) MG tablet Take 325 mg by mouth daily with breakfast.    Yes [provider]  fluticasone (FLONASE) 50 MCG/ACT nasal spray Place 1 spray into both nostrils daily.   Yes [provider]  insulin glargine (LANTUS) 100  UNIT/ML injection Inject 0.15 mLs (15 Units total) at bedtime into the skin. 01/17/17  Yes Regalado, Belkys A, MD  isosorbide mononitrate (IMDUR) 30 MG 24 hr tablet Take 1 tablet (30 mg total) 2 (two) times daily by mouth. 01/17/17  Yes Regalado, Belkys A, MD  latanoprost (XALATAN) 0.005 % ophthalmic solution Place 1 drop into both eyes at bedtime.    Yes [provider]  meclizine (ANTIVERT) 25 MG tablet Take 12.5 mg by mouth 2 (two) times daily.    Yes [provider]  metoprolol succinate (TOPROL-XL) 25 MG 24 hr tablet Take 1 tablet (25 mg total) at bedtime by mouth. 01/17/17  Yes Regalado, Belkys A, MD  nitroGLYCERIN (NITROSTAT) 0.4 MG SL tablet Place 1 tablet (0.4 mg total) under the tongue every 5 (five) minutes as needed for chest pain. 10/14/15  Yes Simmons, Brittainy M, PA-C  pantoprazole (PROTONIX) 40 MG tablet Take 40 mg by mouth daily.    Yes [provider]  rosuvastatin (CRESTOR) 40 MG tablet Take 1 tablet (40 mg total) by mouth at bedtime.  07/12/15  Yes Lily Kocher, MD  diazepam (VALIUM) 5 MG tablet Take 1 tablet (5 mg total) by mouth every 12 (twelve) hours as needed for muscle spasms. Patient not taking: Reported on 01/29/2017 10/29/16   Julianne Rice, MD  warfarin (COUMADIN) 7.5 MG tablet Take 1 tablet (7.5 mg total) daily at 6 PM by mouth. Patient not taking: Reported on 01/29/2017 01/17/17   Elmarie Shiley, MD    Inpatient Medications: Scheduled Meds: . dextrose      . calcitRIOL  0.5 mcg Oral Daily  . ferrous sulfate  325 mg Oral Q breakfast  . fluticasone  1 spray Each Nare Daily  . insulin aspart  0-9 Units Subcutaneous TID WC  . isosorbide mononitrate  30 mg Oral BID  . latanoprost  1 drop Both Eyes QHS  . meclizine  12.5 mg Oral BID  . mouth rinse  15 mL Mouth Rinse BID  . pantoprazole  40 mg Oral Daily  . rosuvastatin  10 mg Oral QHS  . Warfarin - Pharmacist Dosing Inpatient   Does not apply Q24H   Continuous Infusions:  PRN Meds: albuterol, ALPRAZolam, cyclobenzaprine, loratadine, nitroGLYCERIN  Allergies:    Allergies  Allergen Reactions  . Ace Inhibitors Cough  . Amlodipine Swelling    UNSPECIFIED EDEMA   . Codeine Rash    Social History:   Social History   Socioeconomic History  . Marital status: Widowed    Spouse name: Not on file  . Number of children: 3  . Years of education: Not on file  . Highest education level: Not on file  Social Needs  . Financial resource strain: Not on file  . Food insecurity - worry: Not on file  . Food insecurity - inability: Not on file  . Transportation needs - medical: Not on file  . Transportation needs - non-medical: Not on file  Occupational History  . Not on file  Tobacco Use  . Smoking status: Never Smoker  . Smokeless tobacco: Never Used  Substance and Sexual Activity  . Alcohol use: No    Alcohol/week: 0.0 oz  . Drug use: No  . Sexual activity: Not on file  Other Topics Concern  . Not on file  Social History Narrative  . Not on  file    Family History:    Family History  Problem Relation Age of Onset  . Diabetes Father   .  Hypertension Father   . Stroke Father   . Hypertension Brother   . Aneurysm Brother   . Diabetes Brother   . Colon cancer Neg Hx      ROS:  Please see the history of present illness.  ROS  All other ROS reviewed and negative.     Physical Exam/Data:   Vitals:   01/30/17 1400 01/30/17 1500 01/30/17 1600 01/30/17 1700  BP: (!) 114/47 113/66 114/82 (!) 112/46  Pulse:  (!) 52 (!) 57 86  Resp: (!) 21 (!) 23 12   Temp:   (!) 97.2 F (36.2 C)   TempSrc:   Oral   SpO2:  100% 100% 100%  Weight:      Height:       No intake or output data in the 24 hours ending 01/30/17 1733 Filed Weights   01/28/17 2338 01/29/17 0300 01/30/17 0600  Weight: 183 lb (83 kg) 189 lb 13.1 oz (86.1 kg) 194 lb 0.1 oz (88 kg)   Body mass index is 29.5 kg/m.  General:  Well nourished, well developed, in no acute distress HEENT: normal Lymph: no adenopathy Neck: no JVD Endocrine:  No thryomegaly Vascular: No carotid bruits; FA pulses 2+ bilaterally without bruits  Cardiac:  normal S1, S2; RRR, bradycardic;  Lungs: Diminished in the bases. No crackles.  Abd: soft, nontender, no hepatomegaly  Ext: Mild dependent edema Musculoskeletal:  No deformities, BUE and BLE strength normal and equal Skin: warm and dry  Neuro:  CNs 2-12 intact, no focal abnormalities noted Psych:  Normal affect   EKG:  The EKG was personally reviewed and demonstrates:  Initially junctional rhythm rate of 40 bpm  Telemetry:  Telemetry was personally reviewed and demonstrates:  Sinus bradycardia rates in the 50;'s.   Relevant CV Studies: Echocardiogram 11/11/2016 Left ventricle: The cavity size was normal. Wall thickness was   increased in a pattern of moderate LVH. Systolic function was   moderately to severely reduced. The estimated ejection fraction   was in the range of 30% to 35%. Features are consistent with a    pseudonormal left ventricular filling pattern, with concomitant   abnormal relaxation and increased filling pressure (grade 2   diastolic dysfunction). Doppler parameters are consistent with   high ventricular filling pressure. - Regional wall motion abnormality: Hypokinesis of the mid inferior   and basal-mid inferolateral myocardium. - Aortic valve: Mildly calcified annulus. Trileaflet; mildly   thickened leaflets. Valve area (VTI): 1.57 cm^2. Valve area   (Vmax): 1.57 cm^2. Valve area (Vmean): 1.51 cm^2. - Mitral valve: Mildly calcified annulus. Mildly thickened leaflets   . There was mild regurgitation. - Left atrium: The atrium was severely dilated. - Right atrium: The atrium was moderately dilated. - Pulmonary arteries: Systolic pressure was moderately increased.   PA peak pressure: 44 mm Hg (S).  Cardiac Catheterization: 12/02/2015 Conclusion     Mid LAD lesion, 60 %stenosed.  Prox Cx to Mid Cx lesion, 99 %stenosed.  Mid RCA lesion, 0 %stenosed at site of prior stent  Dist RCA lesion, 30%stenosed at site of prior stent.  Prox RCA lesion, 40 %stenosed.  Ost Cx to Prox Cx lesion, 80 %stenosed.  LV end diastolic pressure is moderately elevated.   1. 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 Patent stents in the mid RCA and distal RCA/PDA 3. Moderate mid LAD disease- unchanged from prior studies. 4. Elevated LVEDP  Plan: medical therapy with diuresis for CHF.  Laboratory Data:  Chemistry Recent Labs  Lab 01/29/17 0017 01/30/17 1043  NA 133* 132*  K 4.6 5.8*  CL 91* 91*  CO2 25 25  GLUCOSE 390* 105*  BUN 46* 57*  CREATININE 5.51* 6.57*  CALCIUM 9.3 8.8*  GFRNONAA 7* 6*  GFRAA 8* 7*  ANIONGAP 17* 16*   Hematology Recent Labs  Lab 01/29/17 0017  WBC 6.1  RBC 3.75*  HGB 10.7*  HCT 37.2  MCV 99.2  MCH 28.5  MCHC 28.8*  RDW 20.0*  PLT 214   Cardiac Enzymes Recent Labs  Lab 01/29/17 0723 01/29/17 1214  TROPONINI  0.10* 0.11*    Recent Labs  Lab 01/29/17 0034  TROPIPOC 0.09*    BNPNo results for input(s): BNP, PROBNP in the last 168 hours.  DDimer No results for input(s): DDIMER in the last 168 hours.  Radiology/Studies:  Dg Chest Port 1 View  Result Date: 01/29/2017 CLINICAL DATA:  Increasing weakness after dialysis this afternoon. EXAM: PORTABLE CHEST 1 VIEW COMPARISON:  12/26/2016 FINDINGS: Interval placement of a right central venous catheter with tip over the cavoatrial junction region. No pneumothorax. Cardiac enlargement with pulmonary vascular congestion. Bilateral pleural effusions with basilar atelectasis. No significant change since previous study. IMPRESSION: Cardiac enlargement, pulmonary vascular congestion, bilateral pleural effusions, and basilar atelectasis similar to previous study. Electronically Signed   By: Lucienne Capers M.D.   On: 01/29/2017 01:09    Assessment and Plan:   1. Junctional Rhythm-Bradycardia: Likely from higher dose of metoprolol at 100 mg, which she was taking by mistake instead of prescribed 25 mg daily on recent discharge from [facility] on 01/17/2017. Hospitalist holding amiodarone and metoprolol. Amiodarone has slowed the heart some but do not expect succh an abrupt increase (40 to 55) with holding part of day.   Agree with stopping metoprolol. Reassess HR in am  May just start amiodarone 200.    2. CAD: Stents to mid RCA and distal RCA/PDA, with CTO of LCx and moderate LAD disease. Continuing to treat medically. She will remain on Plavix and Crestor.   3. PAF: She is bradycardic but improved off of AV nodal blockers.  See problem 1   Was on coumadin therapy but this was held in the setting of elevated INR of 4.97  Will need to follow and resume when close to 2   Home on amio probably and coumadin when INR  Down  4. Chronic Systolic CHF: Recent echocardiogram with EF of 30-35%. She is not on ACE due to CKD.  Follow HR and BP  Need to reasess metoprolol use   Does  better in SR  5. ESRD: Currently receiving dialysis in [place]. Have dialysis here tomorrow  For questions or updates, please contact [place] Please consult [email or web address] for contact info under Cardiology/STEMI.   Signed, Phill Myron. West Pugh, ANP, AACC  01/30/2017 5:33 PM   PT seen and examined   I have amended note above by Arnold Long to reflect my findings Currenlty comfortable  BP 110s to 120s/  HR 55 (SB) Neck  JVP mildly increased Lungs are CTA Cardiac RRR  No S3 Ext 1+ edema  Wrapped  Did not examine healing wounds  Plan to hold meds tonight  Current bradycarda appears to be due to mistake of patient (took wrong metoprolol) Prob resume amiodarone 100 or 200 tomorrow Follow BP  If gets metoprolol use only low dose  Pt with some volume increase on exam  For dialysis tomorrow Will follow  Dorris Carnes

## 2017-01-30 NOTE — Progress Notes (Signed)
PT Cancellation Note  Patient Details Name: [name] MRN: [id number] DOB: 1945/11/06   Cancelled Treatment:    Reason Eval/Treat Not Completed: Medical issues which prohibited therapy(RN reports patient with low BG (50s) and asks PT evla be held at this time. Will attempt again at later date/time. )  10:37 AM, 01/30/17 [name], PT, DPT Physical Therapist - [place] [phone] (Office)    [name] C 01/30/2017, 10:37 AM

## 2017-01-30 NOTE — Progress Notes (Signed)
[place] for Coumadin (home med) Indication: atrial fibrillation  Allergies  Allergen Reactions  . Ace Inhibitors Cough  . Amlodipine Swelling    UNSPECIFIED EDEMA   . Codeine Rash   Patient Measurements: Height: 5\' 8"  (172.7 cm) Weight: 194 lb 0.1 oz (88 kg) IBW/kg (Calculated) : 63.9  Vital Signs: Temp: 97.6 F (36.4 C) (11/26 0600) Temp Source: Oral (11/26 0600) BP: 115/58 (11/26 0600) Pulse Rate: 52 (11/26 0600)  Labs: Recent Labs    01/29/17 0017 01/29/17 0723 01/29/17 1214 01/30/17 0457  HGB 10.7*  --   --   --   HCT 37.2  --   --   --   PLT 214  --   --   --   LABPROT 36.1*  --   --  45.9*  INR 3.65  --   --  4.97*  CREATININE 5.51*  --   --   --   TROPONINI  --  0.10* 0.11*  --    Estimated Creatinine Clearance: 10.7 mL/min (A) (by C-G formula based on SCr of 5.51 mg/dL (H)).  Medical History: Past Medical History:  Diagnosis Date  . Acute renal failure superimposed on stage 4 chronic kidney disease ([place]) 07/04/2015  . Allergic rhinitis   . Anemia   . Anxiety   . CAD in native artery    NSTEMI 07/2015 s/p DES to RCA and posterior PDA, PCI 10/2015 with scoring balloon to 85% ISR of distal RCA), known LAD/Cx disease treated medically  . Carotid artery disease ([place])    Mild bilateral carotid disease (1-39% 07/2015)  . Chronic combined systolic and diastolic CHF (congestive heart failure) ([place])   . CKD (chronic kidney disease), stage IV ([place])   . Constipation   . Essential hypertension   . GERD (gastroesophageal reflux disease)   . History of hysterectomy   . Hyperlipidemia   . Low back pain   . Obesity   . 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 Osteoarthritis   . Overactive bladder   . PVC's (premature ventricular contractions)   . Type 2 diabetes mellitus ([facility])    Medications:  Medications Prior to Admission  Medication Sig Dispense  Refill Last Dose  . albuterol (PROVENTIL HFA;VENTOLIN HFA) 108 (90 Base) MCG/ACT inhaler Inhale 1-2 puffs into the lungs every 6 (six) hours as needed for wheezing or shortness of breath. 1 Inhaler 0 unknnown at prn  . albuterol (PROVENTIL) (2.5 MG/3ML) 0.083% nebulizer solution Take 3 mLs (2.5 mg total) by nebulization every 6 (six) hours as needed for wheezing or shortness of breath. 75 mL 1 unknown at prn  . ALPRAZolam (XANAX) 0.5 MG tablet Take 0.5 mg by mouth at bedtime.   12/22/2016  . amiodarone (PACERONE) 200 MG tablet Take 1 tablet (200 mg total) daily by mouth. 30 tablet 0   . calcitRIOL (ROCALTROL) 0.5 MCG capsule Take 1 capsule (0.5 mcg total) daily by mouth. 30 capsule 0   . cetirizine (ZYRTEC) 10 MG tablet Take 5 mg by mouth daily.  3 12/23/2016  . clopidogrel (PLAVIX) 75 MG tablet Take 1 tablet (75 mg total) by mouth daily. 30 tablet 0 12/23/2016  . cyclobenzaprine (FLEXERIL) 5 MG tablet Take 5 mg by mouth as needed (lower back pain). osteoporosis with out current pathological fracture for 14 days  0 unknown at prn  . ferrous sulfate 325 (65 FE) MG tablet Take 325  mg by mouth daily with breakfast.    12/23/2016  . fluticasone (FLONASE) 50 MCG/ACT nasal spray Place 1 spray into both nostrils daily.   12/23/2016  . insulin glargine (LANTUS) 100 UNIT/ML injection Inject 0.15 mLs (15 Units total) at bedtime into the skin. 10 mL 11   . isosorbide mononitrate (IMDUR) 30 MG 24 hr tablet Take 1 tablet (30 mg total) 2 (two) times daily by mouth. 60 tablet 0   . latanoprost (XALATAN) 0.005 % ophthalmic solution Place 1 drop into both eyes at bedtime.    12/22/2016  . meclizine (ANTIVERT) 25 MG tablet Take 12.5 mg by mouth 2 (two) times daily.    12/23/2016  . metoprolol succinate (TOPROL-XL) 25 MG 24 hr tablet Take 1 tablet (25 mg total) at bedtime by mouth. 30 tablet 0   . nitroGLYCERIN (NITROSTAT) 0.4 MG SL tablet Place 1 tablet (0.4 mg total) under the tongue every 5 (five) minutes as needed  for chest pain. 25 tablet 2 unknown at prn  . pantoprazole (PROTONIX) 40 MG tablet Take 40 mg by mouth daily.    12/23/2016  . rosuvastatin (CRESTOR) 40 MG tablet Take 1 tablet (40 mg total) by mouth at bedtime. 30 tablet 0 12/22/2016  . diazepam (VALIUM) 5 MG tablet Take 1 tablet (5 mg total) by mouth every 12 (twelve) hours as needed for muscle spasms. (Patient not taking: Reported on 01/29/2017) 10 tablet 0 Not Taking at Unknown time  . warfarin (COUMADIN) 7.5 MG tablet Take 1 tablet (7.5 mg total) daily at 6 PM by mouth. (Patient not taking: Reported on 01/29/2017) 30 tablet 0 Not Taking at Unknown time    Assessment: 71yo female on chronic Coumadin for h/o afib.  INR remains SUPRAtherapeutic today  Goal of Therapy:  INR 2-3 Monitor platelets by anticoagulation protocol: Yes   Plan:  HOLD coumadin today due to elevated INR Allow INR to trend down Check INR daily  [name] 01/30/2017,8:20 AM

## 2017-01-30 NOTE — Plan of Care (Signed)
  Acute Rehab PT Goals(only PT should resolve) Pt Will Go Supine/Side To Sit 01/30/2017 1535 - Progressing by Lonell Grandchild, PT Flowsheets Taken 01/30/2017 1535  Pt will go Supine/Side to Sit Independently Pt Will Go Sit To Supine/Side 01/30/2017 1535 - Progressing by Lonell Grandchild, PT Flowsheets Taken 01/30/2017 1535  Pt will go Sit to Supine/Side with modified independence Patient Will Transfer Sit To/From Stand 01/30/2017 1535 - Progressing by Lonell Grandchild, PT Flowsheets Taken 01/30/2017 1535  Patient will transfer sit to/from stand with modified independence Pt Will Transfer Bed To Chair/Chair To Bed 01/30/2017 1535 - Progressing by Lonell Grandchild, PT Flowsheets Taken 01/30/2017 1535  Pt will Transfer Bed to Chair/Chair to Bed with modified independence Pt Will Ambulate 01/30/2017 1535 - Progressing by Lonell Grandchild, PT Flowsheets Taken 01/30/2017 1535  Pt will Ambulate 50 feet;with minimal assist;with rolling walker  3:36 PM, 01/30/17 Lonell Grandchild, MPT Physical Therapist with [facility] [phone] office [phone] mobile phone

## 2017-01-30 NOTE — Care Management Note (Signed)
Case Management Note  Patient Details  Name: [name] MRN: [id number] Date of Birth: 06/25/1945  Subjective/Objective:   Adm with bradycardia. From home, husband has recently passed away last week. Patient is active with [facility] health for RN, aide and PT services.                  Action/Plan: Anticipate DC home with resumption of home health services. Santiago Glad of [facility] aware.    Expected Discharge Date:  01/30/17               Expected Discharge Plan:  [place]  In-House Referral:     Discharge planning Services  CM Consult  Post Acute Care Choice:  Home Health, Resumption of Svcs/PTA Provider Choice offered to:  Patient  DME Arranged:    DME Agency:     HH Arranged:    [place] Agency:     Status of Service:  In process, will continue to follow  If discussed at Long Length of Stay Meetings, dates discussed:    Additional Comments:  Advika Mclelland, Chauncey Reading, RN 01/30/2017, 2:53 PM

## 2017-01-30 NOTE — Evaluation (Signed)
Physical Therapy Evaluation Patient Details Name: [name] MRN: [id number] DOB: [birth year] Today's Date: 01/30/2017   History of Present Illness   [name] is a 71 y.o. female with medical history significant of allergic rhinitis, anemia, anxiety, depression, CAD, coronary artery disease, chronic combined systolic and diastolic CHF, chronic constipation, hypertension, GERD, hyperlipidemia, chronic back pain, osteoarthritis, type 2 diabetes, ESRD on hemodialysis, recently diagnosed with atrial fibrillation in September who is coming to the emergency department with complaints of worsening chronic generalized weakness.  Her metoprolol was apparently increased from 25-100 mg p.o. daily.  She is also taking amiodarone 200 mg p.o.  She denies chest pain, palpitations, diaphoresis, PND orthopnea, pitting edema of the lower extremities, she recently lost her husband.  His funeral was yesterday.    Clinical Impression  Patient limited for functional mobility as stated below secondary to BLE weakness, fatigue and poor standing balance.  Patient will benefit from continued physical therapy in hospital and recommended venue below to increase strength, balance, endurance for safe ADLs and gait.    Follow Up Recommendations SNF;Supervision/Assistance - 24 hour    Equipment Recommendations  None recommended by PT    Recommendations for Other Services       Precautions / Restrictions Precautions Precautions: Fall Restrictions Weight Bearing Restrictions: No      Mobility  Bed Mobility Overal bed mobility: Needs Assistance Bed Mobility: Supine to Sit;Rolling Rolling: Supervision   Supine to sit: Min guard     General bed mobility comments: Min guard to scoot forward  Transfers Overall transfer level: Needs assistance Equipment used: Rolling walker (2 wheeled) Transfers: Sit to/from [facility] Sit to Stand: Min assist Stand pivot transfers: Min assist           Ambulation/Gait Ambulation/Gait assistance: Mod assist Ambulation Distance (Feet): 10 Feet Assistive device: Rolling walker (2 wheeled) Gait Pattern/deviations: Decreased step length - right;Decreased step length - left;Decreased stride length   Gait velocity interpretation: Below normal speed for age/gender General Gait Details: slow labored movement taking side steps, forward/backwards - limited secondary to fatigue/decreased balance  Stairs            Wheelchair Mobility    Modified Rankin (Stroke Patients Only)       Balance Overall balance assessment: Needs assistance Sitting-balance support: No upper extremity supported;Feet supported Sitting balance-[name]: Good     Standing balance support: Bilateral upper extremity supported;During functional activity Standing balance-[name]: Fair                               Pertinent Vitals/Pain Pain Assessment: No/denies pain    Home Living Family/patient expects to be discharged to:: Private residence Living Arrangements: Children Available Help at Discharge: Family(limited secondary her daughter has chronic back pain) Type of Home: House Home Access: Level entry     Home Layout: Two level Home Equipment: Walker - 4 wheels;Shower seat;Bedside commode;Wheelchair - manual      Prior Function Level of Independence: Independent with assistive device(s)               Hand Dominance        Extremity/Trunk Assessment   Upper Extremity Assessment Upper Extremity Assessment: Generalized weakness    Lower Extremity Assessment Lower Extremity Assessment: Generalized weakness(wounds to BLE )    Cervical / Trunk Assessment Cervical / Trunk Assessment: Normal  Communication   Communication: No difficulties  Cognition Arousal/Alertness: Awake/alert Behavior During  Therapy: WFL for tasks assessed/performed Overall Cognitive Status: Within Functional Limits for tasks  assessed                                        General Comments      Exercises     Assessment/Plan    PT Assessment Patient needs continued PT services  PT Problem List Decreased strength;Decreased activity tolerance;Decreased balance;Decreased mobility       PT Treatment Interventions Gait training;Functional mobility training;Therapeutic activities;Therapeutic exercise;Patient/family education;Stair training    PT Goals (Current goals can be found in the Care Plan section)  Acute Rehab PT Goals Patient Stated Goal: Return home PT Goal Formulation: With patient Time For Goal Achievement: 02/13/17 Potential to Achieve Goals: Good    Frequency Min 3X/week   Barriers to discharge        Co-evaluation               AM-PAC PT "6 Clicks" Daily Activity  Outcome Measure Difficulty turning over in bed (including adjusting bedclothes, sheets and blankets)?: None Difficulty moving from lying on back to sitting on the side of the bed? : A Little Difficulty sitting down on and standing up from a chair with arms (e.g., wheelchair, bedside commode, etc,.)?: A Lot Help needed moving to and from a bed to chair (including a wheelchair)?: A Little Help needed walking in hospital room?: A Lot Help needed climbing 3-5 steps with a railing? : A Lot 6 Click Score: 16    End of Session   Activity Tolerance: Patient tolerated treatment well;Patient limited by fatigue Patient left: in chair;with call bell/phone within reach;with family/visitor present Nurse Communication: Mobility status PT Visit Diagnosis: Unsteadiness on feet (R26.81);Other abnormalities of gait and mobility (R26.89);Muscle weakness (generalized) (M62.81)    Time: 1829-9371 PT Time Calculation (min) (ACUTE ONLY): 30 min   Charges:   PT Evaluation $PT Eval Moderate Complexity: 1 Mod PT Treatments $Therapeutic Activity: 23-37 mins   PT G Codes:   PT G-Codes **NOT FOR INPATIENT  CLASS** Functional Assessment Tool Used: AM-PAC 6 Clicks Basic Mobility Functional Limitation: Mobility: Walking and moving around Mobility: Walking and Moving Around Current Status (I9678): At least 40 percent but less than 60 percent impaired, limited or restricted Mobility: Walking and Moving Around Goal Status [phone]): At least 40 percent but less than 60 percent impaired, limited or restricted Mobility: Walking and Moving Around Discharge Status [phone]): At least 40 percent but less than 60 percent impaired, limited or restricted    3:34 PM, 01/30/17 Lonell Grandchild, MPT Physical Therapist with [facility] 336 [phone] office [phone] mobile phone

## 2017-01-30 NOTE — Progress Notes (Signed)
01/30/2017 10:49 AM  Came back to see patient regarding low BS.  Pt had received lantus last night not eating well.  BG now 89.  No iv access right now.  Will DC lantus for now.  Continue sliding coverage (renal).  Encouraged eating.  Pt totally asymptomatic.   I asked ICU RN to follow BS closely today.    Murvin Natal MD

## 2017-01-30 NOTE — Progress Notes (Signed)
PROGRESS NOTE  NAMIRA [name]  QMV:[id number]  DOB: 05-03-1945  DOA: 01/28/2017 PCP: [name], MD  Brief Admission Hx:  [name] is a 71 y.o. female with medical history significant of allergic rhinitis, anemia, anxiety, depression, CAD, coronary artery disease, chronic combined systolic and diastolic CHF, chronic constipation, hypertension, GERD, hyperlipidemia, chronic back pain, osteoarthritis, type 2 diabetes, ESRD on hemodialysis, recently diagnosed with atrial fibrillation in September who is coming to the emergency department with complaints of worsening chronic generalized weakness.  MDM/Assessment & Plan:   1. Generalized weakness - symptoms started after her metoprolol dose was increased to 100 mg recently.  This was held on admission in addition to her amiodarone but she says she still feels very weak.  Will ask for PT eval today.  2. Junctional bradycardia - Pt still bradycardic on monitor but improving.  She did receive glucagon 1 mg sq.  I have asked cardiology to see her today.  3. Hyperlipidemia - resume home crestor 40 mg daily.  4. Essential Hypertension - holding BP meds, BP stable.  Following.  5. CAD - no chest pain or SOB.  Beta blocker on hold for now.  Continuing isosorbide mononitrate 30 mg daily.  6. GERD - continue protonix 40 mg daily.  7. 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  8. Atriad Fibrillation with CHADVASC of 6 - warfarin per pharmacy, holding amiodarone and metoprolol for now.  Cards consult requested.  9. Elevated PT/INR - no active bleeding, hold warfarin. Pharm D consulted to assist with warfarin dosing.    DVT prophylaxis: warfarin Code Status: full  Family Communication: bedside Disposition Plan: TBD   Consultants:  cardiology   Subjective: Pt says she still feels very weak but overall improved.   Objective: Vitals:   01/30/17 0300 01/30/17 0400 01/30/17  0500 01/30/17 0600  BP: (!) 108/49 (!) 105/50 (!) 105/51 (!) 115/58  Pulse: (!) 52 (!) 50 (!) 51 (!) 52  Resp:      Temp:      TempSrc:      SpO2: 100% 100% 100% 100%  Weight:      Height:        Intake/Output Summary (Last 24 hours) at 01/30/2017 0630 Last data filed at 01/29/2017 1553 Gross per 24 hour  Intake -  Output 2 ml  Net -2 ml   Filed Weights   01/28/17 2338 01/29/17 0300  Weight: 83 kg (183 lb) 86.1 kg (189 lb 13.1 oz)     REVIEW OF SYSTEMS  As per history otherwise all reviewed and reported negative  Exam:  General exam: awake, alert, NAD, cooperative.  Respiratory system:  No increased work of breathing. Cardiovascular system: bradycardic.  Gastrointestinal system: Abdomen is nondistended, soft and nontender. Normal bowel sounds heard. Central nervous system: Alert and oriented. No focal neurological deficits. Extremities: no CCE.  Data Reviewed: Basic Metabolic Panel: Recent Labs  Lab 01/29/17 0017  NA 133*  K 4.6  CL 91*  CO2 25  GLUCOSE 390*  BUN 46*  CREATININE 5.51*  CALCIUM 9.3  MG 2.1   Liver Function Tests: No results for input(s): AST, ALT, ALKPHOS, BILITOT, PROT, ALBUMIN in the last 168 hours. No results for input(s): LIPASE, AMYLASE in the last 168 hours. No results for input(s): AMMONIA in the last 168 hours. CBC: Recent Labs  Lab 01/29/17 0017  WBC 6.1  NEUTROABS 4.7  HGB 10.7*  HCT 37.2  MCV 99.2  PLT 214   Cardiac Enzymes: Recent Labs  Lab 01/29/17 0723 01/29/17 1214  TROPONINI 0.10* 0.11*   CBG (last 3)  Recent Labs    01/29/17 1128 01/29/17 1618 01/29/17 2109  GLUCAP 334* 183* 127*   No results found for this or any previous visit (from the past 240 hour(s)).   Studies: Dg Chest Port 1 View  Result Date: 01/29/2017 CLINICAL DATA:  Increasing weakness after dialysis this afternoon. EXAM: PORTABLE CHEST 1 VIEW COMPARISON:  12/26/2016 FINDINGS: Interval placement of a right central venous catheter with  tip over the cavoatrial junction region. No pneumothorax. Cardiac enlargement with pulmonary vascular congestion. Bilateral pleural effusions with basilar atelectasis. No significant change since previous study. IMPRESSION: Cardiac enlargement, pulmonary vascular congestion, bilateral pleural effusions, and basilar atelectasis similar to previous study. Electronically Signed   By: Lucienne Capers M.D.   On: 01/29/2017 01:09     Scheduled Meds: . calcitRIOL  0.5 mcg Oral Daily  . ferrous sulfate  325 mg Oral Q breakfast  . fluticasone  1 spray Each Nare Daily  . insulin aspart  0-9 Units Subcutaneous TID WC  . insulin glargine  15 Units Subcutaneous QHS  . isosorbide mononitrate  30 mg Oral BID  . latanoprost  1 drop Both Eyes QHS  . meclizine  12.5 mg Oral BID  . mouth rinse  15 mL Mouth Rinse BID  . pantoprazole  40 mg Oral Daily  . rosuvastatin  10 mg Oral QHS  . Warfarin - Pharmacist Dosing Inpatient   Does not apply Q24H   Continuous Infusions:  Principal Problem:   Junctional bradycardia Active Problems:   Hyperlipemia   Essential hypertension   Coronary artery disease   GERD   Type 2 diabetes mellitus with stage 4 chronic kidney disease, with long-term current use of insulin (HCC)   Elevated troponin   Atrial fibrillation [facility])   Critical Care Time spent: 34 mins  Irwin Brakeman, MD, FAAFP [facility] Pager [phone]  If 7PM-7AM, please contact night-coverage [email or web address] Password [email or web address] 01/30/2017, 6:30 AM    LOS: 1 day

## 2017-01-31 DIAGNOSIS — R001 Bradycardia, unspecified: Secondary | ICD-10-CM

## 2017-01-31 DIAGNOSIS — I25119 Atherosclerotic heart disease of native coronary artery with unspecified angina pectoris: Secondary | ICD-10-CM

## 2017-01-31 DIAGNOSIS — N186 End stage renal disease: Secondary | ICD-10-CM

## 2017-01-31 DIAGNOSIS — I480 Paroxysmal atrial fibrillation: Secondary | ICD-10-CM

## 2017-01-31 DIAGNOSIS — Z992 Dependence on renal dialysis: Secondary | ICD-10-CM

## 2017-01-31 LAB — COMPREHENSIVE METABOLIC PANEL
ALK PHOS: 194 U/L — AB (ref 38–126)
ALT: 36 U/L (ref 14–54)
ANION GAP: 14 (ref 5–15)
AST: 71 U/L — ABNORMAL HIGH (ref 15–41)
Albumin: 2.8 g/dL — ABNORMAL LOW (ref 3.5–5.0)
BILIRUBIN TOTAL: 0.3 mg/dL (ref 0.3–1.2)
BUN: 67 mg/dL — ABNORMAL HIGH (ref 6–20)
CALCIUM: 9.2 mg/dL (ref 8.9–10.3)
CO2: 27 mmol/L (ref 22–32)
CREATININE: 7.36 mg/dL — AB (ref 0.44–1.00)
Chloride: 92 mmol/L — ABNORMAL LOW (ref 101–111)
GFR calc non Af Amer: 5 mL/min — ABNORMAL LOW (ref >60)
GFR, EST AFRICAN AMERICAN: 6 mL/min — AB (ref >60)
GLUCOSE: 48 mg/dL — AB (ref 65–99)
Potassium: 5.1 mmol/L (ref 3.5–5.1)
Sodium: 133 mmol/L — ABNORMAL LOW (ref 135–145)
TOTAL PROTEIN: 6.9 g/dL (ref 6.5–8.1)

## 2017-01-31 LAB — GLUCOSE, CAPILLARY
GLUCOSE-CAPILLARY: 36 mg/dL — AB (ref 65–99)
GLUCOSE-CAPILLARY: 132 mg/dL — AB (ref 65–99)
Glucose-Capillary: 27 mg/dL — CL (ref 65–99)
Glucose-Capillary: 49 mg/dL — ABNORMAL LOW (ref 65–99)
Glucose-Capillary: 97 mg/dL (ref 65–99)
Glucose-Capillary: 207 mg/dL — ABNORMAL HIGH (ref 65–99)
Glucose-Capillary: 159 mg/dL — ABNORMAL HIGH (ref 65–99)

## 2017-01-31 LAB — CBC
HCT: 34.1 % — ABNORMAL LOW (ref 36.0–46.0)
HEMOGLOBIN: 10.2 g/dL — AB (ref 12.0–15.0)
MCH: 28.8 pg (ref 26.0–34.0)
MCHC: 29.9 g/dL — AB (ref 30.0–36.0)
MCV: 96.3 fL (ref 78.0–100.0)
Platelets: 215 10*3/uL (ref 150–400)
RBC: 3.54 MIL/uL — AB (ref 3.87–5.11)
RDW: 19.9 % — ABNORMAL HIGH (ref 11.5–15.5)
WBC: 5.5 10*3/uL (ref 4.0–10.5)

## 2017-01-31 LAB — PROTIME-INR
INR: 5.64
Prothrombin Time: 50.6 seconds — ABNORMAL HIGH (ref 11.4–15.2)

## 2017-01-31 LAB — MRSA PCR SCREENING: MRSA by PCR: NEGATIVE

## 2017-01-31 MED ORDER — ALTEPLASE 2 MG IJ SOLR
2.0000 mg | Freq: Once | INTRAMUSCULAR | Status: DC | PRN
  Filled 2017-01-31: qty 2

## 2017-01-31 MED ORDER — PENTAFLUOROPROP-TETRAFLUOROETH EX AERO: 1.0000 "application " | INHALATION_SPRAY | CUTANEOUS | Status: DC | PRN

## 2017-01-31 MED ORDER — LIDOCAINE HCL (PF) 1 % IJ SOLN: 5.0000 mL | INTRAMUSCULAR | Status: DC | PRN

## 2017-01-31 MED ORDER — LIDOCAINE-PRILOCAINE 2.5-2.5 % EX CREA: 1.0000 "application " | TOPICAL_CREAM | CUTANEOUS | Status: DC | PRN

## 2017-01-31 MED ORDER — POLYETHYLENE GLYCOL 3350 17 G PO PACK: 17.0000 g | PACK | Freq: Every day | ORAL | Status: DC | PRN

## 2017-01-31 MED ORDER — SENNOSIDES-DOCUSATE SODIUM 8.6-50 MG PO TABS
1.0000 | ORAL_TABLET | Freq: Two times a day (BID) | ORAL | Status: DC
  Administered 2017-01-31 – 2017-02-02 (×5): 1 via ORAL
  Filled 2017-01-31 (×5): qty 1

## 2017-01-31 MED ORDER — HEPARIN SODIUM (PORCINE) 1000 UNIT/ML DIALYSIS
1000.0000 [IU] | INTRAMUSCULAR | Status: DC | PRN
  Administered 2017-01-31: 1000 [IU] via INTRAVENOUS_CENTRAL
  Filled 2017-01-31 (×2): qty 1

## 2017-01-31 MED ORDER — SODIUM CHLORIDE 0.9 % IV SOLN: 100.0000 mL | INTRAVENOUS | Status: DC | PRN

## 2017-01-31 NOTE — NC FL2 (Signed)
[place] LEVEL OF CARE SCREENING TOOL     IDENTIFICATION  Patient Name: [name] Birthdate: 06-24-1945 Sex: female Admission Date (Current Location): 01/28/2017  [facility] and [place] Number:  ([place])   Facility and Address:  [facility] [address] [place]      Provider Number: [id number]  Attending Physician Name and Address:  Murlean Iba, MD  Relative Name and Phone Number:       Current Level of Care: Hospital Recommended Level of Care: [place] Prior Approval Number:    Date Approved/Denied:   PASRR Number:    Discharge Plan: SNF    Current Diagnoses: Patient Active Problem List   Diagnosis Date Noted  . Sinus bradycardia   . ESRD on hemodialysis ([place])   . 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 Encounter for therapeutic drug monitoring 01/19/2017  . Pressure injury of skin 01/16/2017  . Rhonchi   . Hypervolemia   . Hypokalemia 12/23/2016  . Goals of care, counseling/discussion   . Palliative care encounter   . Hypercoagulopathy ([place]) 12/03/2016  . Acute on chronic heart failure ([place]) 12/03/2016  . Ischemic cardiomyopathy   . Generalized weakness   . Atrial fibrillation with RVR ([place]) 11/19/2016  . Abnormal nuclear stress test   . Left ventricular dysfunction   . PAF (paroxysmal atrial fibrillation) ([place]) 11/13/2016  . Anemia in chronic kidney disease   . Atrial fibrillation, new onset ([place]) 11/10/2016  . Hypoglycemia due to insulin 11/10/2016  . Pleural effusion on right 10/31/2016  . HCAP (healthcare-associated pneumonia) 10/31/2016  . DM type 2 causing vascular disease ([place]) 10/05/2016  . Personal history of noncompliance with medical treatment, presenting hazards to health 08/22/2016  . Hyperglycemia 08/07/2016  . Pulmonary edema 07/14/2016  . CKD (chronic kidney disease), stage IV ([place]) 07/14/2016  . Acute respiratory failure with hypoxia ([place]) 07/09/2016  . Acute on  chronic systolic CHF (congestive heart failure) ([place]) 12/01/2015  . AKI (acute kidney injury) ([place])   . Ischemia of upper extremity   . Right knee pain   . Subclavian artery stenosis, right ([place]) 07/07/2015  . Acute on chronic combined systolic and diastolic CHF (congestive heart failure) ([place]) 07/04/2015  . Elevated troponin 07/04/2015  . Acute renal failure superimposed on chronic kidney disease ([place]) 07/04/2015  . Non-ST elevation (NSTEMI) myocardial infarction ([place]) 07/04/2015  . Elevated d-dimer 07/04/2015  . Acute respiratory failure with hypercapnia ([place])   . Bilateral lower extremity edema 01/20/2012  . Type 2 diabetes mellitus with stage 4 chronic kidney disease, with long-term current use of insulin ([place]) 01/21/2011  . Overweight 07/20/2009  . Coronary artery disease 10/08/2008  . HEART MURMUR, SYSTOLIC 19/41/7408  . SHOULDER PAIN 02/05/2007  . Hyperlipemia 04/11/2006  . Anxiety state 04/11/2006  . SYNDROME, CARPAL TUNNEL 04/11/2006  . Essential hypertension 04/11/2006  . ALLERGIC RHINITIS 04/11/2006  . GERD 04/11/2006  . Constipation 04/11/2006  . OVERACTIVE BLADDER 04/11/2006  . OSTEOARTHRITIS 04/11/2006  . LOW BACK PAIN 04/11/2006    Orientation RESPIRATION BLADDER Height & Weight     Self, Time, Situation, Place  Normal Continent Weight: 194 lb 0.1 oz (88 kg) Height:  5\' 8"  (172.7 cm)  BEHAVIORAL SYMPTOMS/MOOD NEUROLOGICAL BOWEL NUTRITION STATUS      Continent Diet(renal/carb modfied)  AMBULATORY STATUS COMMUNICATION OF NEEDS Skin     Verbally Normal  Personal Care Assistance Level of Assistance  Bathing, Feeding, Dressing Bathing Assistance: Limited assistance Feeding assistance: Independent Dressing Assistance: Limited assistance     Functional Limitations Info  Sight, Hearing, Speech Sight Info: Adequate Hearing Info: Adequate Speech Info: Adequate    SPECIAL CARE FACTORS FREQUENCY  PT (By licensed PT)     PT  Frequency: 5x/week              Contractures Contractures Info: Not present    Additional Factors Info  Code Status, Allergies, Psychotropic Code Status Info: Full code Allergies Info: Ace inhibitors, Amlodipine, codeine Psychotropic Info: Xanax, Valium         Current Medications (01/31/2017):  This is the current hospital active medication list Current Facility-Administered Medications  Medication Dose Route Frequency Provider Last Rate Last Dose  . albuterol (PROVENTIL) (2.5 MG/3ML) 0.083% nebulizer solution 2.5 mg  2.5 mg Nebulization Q6H PRN Reubin Milan, MD   2.5 mg at 01/29/17 0346  . ALPRAZolam Duanne Moron) tablet 0.5 mg  0.5 mg Oral QHS PRN Reubin Milan, MD   0.5 mg at 01/30/17 2144  . calcitRIOL (ROCALTROL) capsule 0.5 mcg  0.5 mcg Oral Daily Reubin Milan, MD   0.5 mcg at 01/31/17 0944  . cyclobenzaprine (FLEXERIL) tablet 5 mg  5 mg Oral Daily PRN Reubin Milan, MD      . ferrous sulfate tablet 325 mg  325 mg Oral Q breakfast Reubin Milan, MD   325 mg at 01/31/17 0900  . fluticasone (FLONASE) 50 MCG/ACT nasal spray 1 spray  1 spray Each Nare Daily Reubin Milan, MD   1 spray at 01/31/17 0945  . isosorbide mononitrate (IMDUR) 24 hr tablet 30 mg  30 mg Oral BID Wynetta Emery, Clanford L, MD   30 mg at 01/30/17 2144  . latanoprost (XALATAN) 0.005 % ophthalmic solution 1 drop  1 drop Both Eyes QHS Reubin Milan, MD   1 drop at 01/30/17 2147  . loratadine (CLARITIN) tablet 10 mg  10 mg Oral Daily PRN Reubin Milan, MD      . meclizine (ANTIVERT) tablet 12.5 mg  12.5 mg Oral BID Reubin Milan, MD   12.5 mg at 01/31/17 0945  . MEDLINE mouth rinse  15 mL Mouth Rinse BID Isaac Bliss, Rayford Halsted, MD   15 mL at 01/31/17 0949  . nitroGLYCERIN (NITROSTAT) SL tablet 0.4 mg  0.4 mg Sublingual Q5 min PRN Reubin Milan, MD      . pantoprazole (PROTONIX) EC tablet 40 mg  40 mg Oral Daily Reubin Milan, MD   40 mg at 01/31/17 0944   . rosuvastatin (CRESTOR) tablet 10 mg  10 mg Oral QHS Isaac Bliss, Rayford Halsted, MD   10 mg at 01/30/17 2144  . Warfarin - Pharmacist Dosing Inpatient   Does not apply Q24H Isaac Bliss, Rayford Halsted, MD   Stopped at 01/30/17 1600     Discharge Medications: Please see discharge summary for a list of discharge medications.  Relevant Imaging Results:  Relevant Lab Results:   Additional Information SS#: [id number]  Ihor Gully, LCSW

## 2017-01-31 NOTE — Progress Notes (Signed)
AC pulled heparin 1000 per request for the dialysis nurse.  Heparin was out inside dialysis room pyxis.

## 2017-01-31 NOTE — Progress Notes (Signed)
[employer] for Coumadin (home med) Indication: atrial fibrillation  Allergies  Allergen Reactions  . Ace Inhibitors Cough  . Amlodipine Swelling    UNSPECIFIED EDEMA   . Codeine Rash   Patient Measurements: Height: 5\' 8"  (172.7 cm) Weight: 194 lb 0.1 oz (88 kg) IBW/kg (Calculated) : 63.9  Vital Signs: Temp: 97.9 F (36.6 C) (11/27 0400) Temp Source: Oral (11/27 0400) BP: 113/49 (11/26 2200) Pulse Rate: 59 (11/26 2200)  Labs: Recent Labs    01/29/17 0017 01/29/17 0723 01/29/17 1214 01/30/17 0457 01/30/17 1043 01/31/17 0529  HGB 10.7*  --   --   --   --  10.2*  HCT 37.2  --   --   --   --  34.1*  PLT 214  --   --   --   --  215  LABPROT 36.1*  --   --  45.9*  --  50.6*  INR 3.65  --   --  4.97*  --  5.64*  CREATININE 5.51*  --   --   --  6.57* 7.36*  TROPONINI  --  0.10* 0.11*  --   --   --    Estimated Creatinine Clearance: 8 mL/min (A) (by C-G formula based on SCr of 7.36 mg/dL (H)).  Medical History: Past Medical History:  Diagnosis Date  . Acute renal failure superimposed on stage 4 chronic kidney disease ([place]) 07/04/2015  . Allergic rhinitis   . Anemia   . Anxiety   . CAD in native artery    NSTEMI 07/2015 s/p DES to RCA and posterior PDA, PCI 10/2015 with scoring balloon to 85% ISR of distal RCA), known LAD/Cx disease treated medically  . Carotid artery disease ([place])    Mild bilateral carotid disease (1-39% 07/2015)  . Chronic combined systolic and diastolic CHF (congestive heart failure) ([place])   . CKD (chronic kidney disease), stage IV ([place])   . Constipation   . Essential hypertension   . GERD (gastroesophageal reflux disease)   . History of hysterectomy   . Hyperlipidemia   . Low back pain   . Obesity   . 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 Osteoarthritis   . Overactive bladder   . PVC's (premature ventricular contractions)   . Type  2 diabetes mellitus (HCC)    Medications:  Medications Prior to Admission  Medication Sig Dispense Refill Last Dose  . albuterol (PROVENTIL HFA;VENTOLIN HFA) 108 (90 Base) MCG/ACT inhaler Inhale 1-2 puffs into the lungs every 6 (six) hours as needed for wheezing or shortness of breath. 1 Inhaler 0 unknnown at prn  . albuterol (PROVENTIL) (2.5 MG/3ML) 0.083% nebulizer solution Take 3 mLs (2.5 mg total) by nebulization every 6 (six) hours as needed for wheezing or shortness of breath. 75 mL 1 unknown at prn  . ALPRAZolam (XANAX) 0.5 MG tablet Take 0.5 mg by mouth at bedtime.   12/22/2016  . amiodarone (PACERONE) 200 MG tablet Take 1 tablet (200 mg total) daily by mouth. 30 tablet 0   . calcitRIOL (ROCALTROL) 0.5 MCG capsule Take 1 capsule (0.5 mcg total) daily by mouth. 30 capsule 0   . cetirizine (ZYRTEC) 10 MG tablet Take 5 mg by mouth daily.  3 12/23/2016  . clopidogrel (PLAVIX) 75 MG tablet Take 1 tablet (75 mg total) by mouth daily. 30 tablet 0 12/23/2016  . cyclobenzaprine (FLEXERIL) 5 MG tablet Take 5 mg  by mouth as needed (lower back pain). osteoporosis with out current pathological fracture for 14 days  0 unknown at prn  . ferrous sulfate 325 (65 FE) MG tablet Take 325 mg by mouth daily with breakfast.    12/23/2016  . fluticasone (FLONASE) 50 MCG/ACT nasal spray Place 1 spray into both nostrils daily.   12/23/2016  . insulin glargine (LANTUS) 100 UNIT/ML injection Inject 0.15 mLs (15 Units total) at bedtime into the skin. 10 mL 11   . isosorbide mononitrate (IMDUR) 30 MG 24 hr tablet Take 1 tablet (30 mg total) 2 (two) times daily by mouth. 60 tablet 0   . latanoprost (XALATAN) 0.005 % ophthalmic solution Place 1 drop into both eyes at bedtime.    12/22/2016  . meclizine (ANTIVERT) 25 MG tablet Take 12.5 mg by mouth 2 (two) times daily.    12/23/2016  . metoprolol succinate (TOPROL-XL) 25 MG 24 hr tablet Take 1 tablet (25 mg total) at bedtime by mouth. 30 tablet 0   . nitroGLYCERIN  (NITROSTAT) 0.4 MG SL tablet Place 1 tablet (0.4 mg total) under the tongue every 5 (five) minutes as needed for chest pain. 25 tablet 2 unknown at prn  . pantoprazole (PROTONIX) 40 MG tablet Take 40 mg by mouth daily.    12/23/2016  . rosuvastatin (CRESTOR) 40 MG tablet Take 1 tablet (40 mg total) by mouth at bedtime. 30 tablet 0 12/22/2016  . diazepam (VALIUM) 5 MG tablet Take 1 tablet (5 mg total) by mouth every 12 (twelve) hours as needed for muscle spasms. (Patient not taking: Reported on 01/29/2017) 10 tablet 0 Not Taking at Unknown time  . warfarin (COUMADIN) 7.5 MG tablet Take 1 tablet (7.5 mg total) daily at 6 PM by mouth. (Patient not taking: Reported on 01/29/2017) 30 tablet 0 Not Taking at Unknown time    Assessment: 71yo female on chronic Coumadin for h/o afib.  INR remains SUPRAtherapeutic today  Goal of Therapy:  INR 2-3 Monitor platelets by anticoagulation protocol: Yes   Plan:  HOLD coumadin today due to elevated INR Allow INR to trend down Check INR daily  [name] 01/31/2017,8:48 AM

## 2017-01-31 NOTE — Consult Note (Signed)
Reason for Consult: End-stage renal disease Referring Physician: Dr. Gaetano Net is an 71 y.o. female.  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  When she was evaluated in the emergency room she was found to have bradycardia with junctional rhythm and hypotension.  Presently patient states that she is feeling somewhat better.  According to her she had new onset atrial fibrillation and was put on metoprolol and amiodarone.  Patient states that since she has lost her husband on Friday she might have taken more metoprolol without knowing it.  She denies any nausea or vomiting.  Denies also any difficulty breathing.  Past Medical History:  Diagnosis Date  . Acute renal failure superimposed on stage 4 chronic kidney disease ([place]) 07/04/2015  . Allergic rhinitis   . Anemia   . Anxiety   . CAD in native artery    NSTEMI 07/2015 s/p DES to RCA and posterior PDA, PCI 10/2015 with scoring balloon to 85% ISR of distal RCA), known LAD/Cx disease treated medically  . Carotid artery disease ([place])    Mild bilateral carotid disease (1-39% 07/2015)  . Chronic combined systolic and diastolic CHF (congestive heart failure) ([place])   . CKD (chronic kidney disease), stage IV ([place])   . Constipation   . Essential hypertension   . GERD (gastroesophageal reflux disease)   . History of hysterectomy   . Hyperlipidemia   . Low back pain   . Obesity   . 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 Osteoarthritis   . Overactive bladder   . PVC's (premature ventricular contractions)   . Type 2 diabetes mellitus ([place])     Past Surgical History:  Procedure Laterality Date  . ABDOMINAL HYSTERECTOMY    . AV FISTULA PLACEMENT Left 12/30/2016   Procedure: insertion of left upper arm gortex GRAFT;   Surgeon: Angelia Mould, MD;  Location: [facility] OR;  Service: Vascular;  Laterality: Left;  . BACK SURGERY     multiple  . CARDIAC CATHETERIZATION  04/04/2006   Est EF of 60%  . CARDIAC CATHETERIZATION N/A 07/04/2015   Procedure: Left Heart Cath and Coronary Angiography;  Surgeon: Troy Sine, MD;  Location: [facility] LAB;  Service: Cardiovascular;  Laterality: N/A;  . CARDIAC CATHETERIZATION N/A 07/04/2015   Procedure: Coronary Stent Intervention;  Surgeon: Troy Sine, MD;  Location: [place] [facility];  Service: Cardiovascular;  Laterality: N/A;  . CARDIAC CATHETERIZATION N/A 10/13/2015   Procedure: Left Heart Cath and Coronary Angiography;  Surgeon: Troy Sine, MD;  Location: [facility];  Service: Cardiovascular;  Laterality: N/A;  . CARDIAC CATHETERIZATION N/A 12/02/2015   Procedure: Left Heart Cath and Coronary Angiography;  Surgeon: Peter M Martinique, MD;  Location: [facility];  Service: Cardiovascular;  Laterality: N/A;  . CARPAL TUNNEL RELEASE    . CERVICAL BIOPSY     cervical lymph node biopsies  . COLONOSCOPY  May 2002   Dr. Irving Shows :Followup in 5 years, normal exam  . COLONOSCOPY  2008   Dr. Laural Golden: Very redundant colon with mild melanosis coli, splenic flexure polyp biopsy with acute complaint of benign colon polyp. Recommended ten-year followup  . CORONARY STENT PLACEMENT  04/11/2006   2 -- Taxus stents to the circumflex   . IR FLUORO GUIDE CV  LINE RIGHT  01/07/2017  . IR FLUORO GUIDE CV LINE RIGHT  01/16/2017  . IR US GUIDE VASC ACCESS RIGHT  01/07/2017  . IR US GUIDE VASC ACCESS RIGHT  01/16/2017  . PERIPHERAL VASCULAR CATHETERIZATION Right 07/09/2015   Procedure: Upper Extremity Angiography;  Surgeon: Conrad Greenfields, MD;  Location: [facility];  Service: Cardiovascular;  Laterality: Right;  . PERIPHERAL VASCULAR CATHETERIZATION Right 07/10/2015   Procedure: RIGHT SUBCLAVIAN ARTERY THROMBECTOMY;  Surgeon: Serafina Mitchell, MD;  Location: [facility] OR;   Service: Vascular;  Laterality: Right;  . Tendonitis     bilateral elbow  . TRIGGER FINGER RELEASE      Family History  Problem Relation Age of Onset  . Diabetes Father   . Hypertension Father   . Stroke Father   . Hypertension Brother   . Aneurysm Brother   . Diabetes Brother   . Colon cancer Neg Hx     Social History:  reports that  has never smoked. she has never used smokeless tobacco. She reports that she does not drink alcohol or use drugs.  Allergies:  Allergies  Allergen Reactions  . Ace Inhibitors Cough  . Amlodipine Swelling    UNSPECIFIED EDEMA   . Codeine Rash    Medications: I have reviewed the patient's current medications.  Results for orders placed or performed during the hospital encounter of 01/28/17 (from the past 48 hour(s))  Glucose, capillary     Status: Abnormal   Collection Time: 01/29/17 11:28 AM  Result Value Ref Range   Glucose-Capillary 334 (H) 65 - 99 mg/dL  Troponin I     Status: Abnormal   Collection Time: 01/29/17 12:14 PM  Result Value Ref Range   Troponin I 0.11 (HH) <0.03 ng/mL    Comment: CRITICAL RESULT CALLED TO, READ BACK BY AND VERIFIED WITH: HYLTON,L @ 1312 ON 11.25.18 BY BOWMAN,L   Glucose, capillary     Status: Abnormal   Collection Time: 01/29/17  4:18 PM  Result Value Ref Range   Glucose-Capillary 183 (H) 65 - 99 mg/dL  Glucose, capillary     Status: Abnormal   Collection Time: 01/29/17  9:09 PM  Result Value Ref Range   Glucose-Capillary 127 (H) 65 - 99 mg/dL  Protime-INR     Status: Abnormal   Collection Time: 01/30/17  4:57 AM  Result Value Ref Range   Prothrombin Time 45.9 (H) 11.4 - 15.2 seconds   INR 4.97 (HH)     Comment: RESULT REPEATED AND VERIFIED CRITICAL RESULT CALLED TO, READ BACK BY AND VERIFIED WITH: HEARN,J AT 0545 BY HUFFINES,S ON 01/30/17.   TSH     Status: None   Collection Time: 01/30/17  4:57 AM  Result Value Ref Range   TSH 0.894 0.350 - 4.500 uIU/mL    Comment: Performed by a 3rd  Generation assay with a functional sensitivity of <=0.01 uIU/mL.  Glucose, capillary     Status: Abnormal   Collection Time: 01/30/17  9:02 AM  Result Value Ref Range   Glucose-Capillary 27 (LL) 65 - 99 mg/dL   Comment 1 Document in Chart   Glucose, capillary     Status: Abnormal   Collection Time: 01/30/17  9:04 AM  Result Value Ref Range   Glucose-Capillary 37 (LL) 65 - 99 mg/dL   Comment 1 Document in Chart   Glucose, capillary     Status: Abnormal   Collection Time: 01/30/17  9:40 AM  Result Value Ref Range   Glucose-Capillary  46 (L) 65 - 99 mg/dL  Glucose, capillary     Status: Abnormal   Collection Time: 01/30/17 10:10 AM  Result Value Ref Range   Glucose-Capillary 59 (L) 65 - 99 mg/dL  Basic metabolic panel     Status: Abnormal   Collection Time: 01/30/17 10:43 AM  Result Value Ref Range   Sodium 132 (L) 135 - 145 mmol/L   Potassium 5.8 (H) 3.5 - 5.1 mmol/L    Comment: DELTA CHECK NOTED SPECIMEN HEMOLYZED. HEMOLYSIS MAY AFFECT INTEGRITY OF RESULTS.    Chloride 91 (L) 101 - 111 mmol/L   CO2 25 22 - 32 mmol/L   Glucose, Bld 105 (H) 65 - 99 mg/dL   BUN 57 (H) 6 - 20 mg/dL   Creatinine, Ser 6.57 (H) 0.44 - 1.00 mg/dL   Calcium 8.8 (L) 8.9 - 10.3 mg/dL   GFR calc non Af Amer 6 (L) >60 mL/min   GFR calc Af Amer 7 (L) >60 mL/min    Comment: (NOTE) The eGFR has been calculated using the CKD EPI equation. This calculation has not been validated in all clinical situations. eGFR's persistently <60 mL/min signify possible Chronic Kidney Disease.    Anion gap 16 (H) 5 - 15  Glucose, capillary     Status: None   Collection Time: 01/30/17 10:48 AM  Result Value Ref Range   Glucose-Capillary 89 65 - 99 mg/dL  Glucose, capillary     Status: Abnormal   Collection Time: 01/30/17 11:58 AM  Result Value Ref Range   Glucose-Capillary 161 (H) 65 - 99 mg/dL  Glucose, capillary     Status: Abnormal   Collection Time: 01/30/17  4:42 PM  Result Value Ref Range   Glucose-Capillary 170  (H) 65 - 99 mg/dL   Comment 1 Notify RN    Comment 2 Document in Chart   Glucose, capillary     Status: Abnormal   Collection Time: 01/30/17  9:31 PM  Result Value Ref Range   Glucose-Capillary 122 (H) 65 - 99 mg/dL  Protime-INR     Status: Abnormal   Collection Time: 01/31/17  5:29 AM  Result Value Ref Range   Prothrombin Time 50.6 (H) 11.4 - 15.2 seconds   INR 5.64 (HH)     Comment: RESULT REPEATED AND VERIFIED CRITICAL RESULT CALLED TO, READ BACK BY AND VERIFIED WITH: MCGIBBONY,C AT 0705 BY HUFFINES,S ON 01/31/17.   Comprehensive metabolic panel     Status: Abnormal   Collection Time: 01/31/17  5:29 AM  Result Value Ref Range   Sodium 133 (L) 135 - 145 mmol/L   Potassium 5.1 3.5 - 5.1 mmol/L   Chloride 92 (L) 101 - 111 mmol/L   CO2 27 22 - 32 mmol/L   Glucose, Bld 48 (L) 65 - 99 mg/dL   BUN 67 (H) 6 - 20 mg/dL   Creatinine, Ser 7.36 (H) 0.44 - 1.00 mg/dL   Calcium 9.2 8.9 - 10.3 mg/dL   Total Protein 6.9 6.5 - 8.1 g/dL   Albumin 2.8 (L) 3.5 - 5.0 g/dL   AST 71 (H) 15 - 41 U/L   ALT 36 14 - 54 U/L   Alkaline Phosphatase 194 (H) 38 - 126 U/L   Total Bilirubin 0.3 0.3 - 1.2 mg/dL   GFR calc non Af Amer 5 (L) >60 mL/min   GFR calc Af Amer 6 (L) >60 mL/min    Comment: (NOTE) The eGFR has been calculated using the CKD EPI equation. This calculation has not  been validated in all clinical situations. eGFR's persistently <60 mL/min signify possible Chronic Kidney Disease.    Anion gap 14 5 - 15  CBC     Status: Abnormal   Collection Time: 01/31/17  5:29 AM  Result Value Ref Range   WBC 5.5 4.0 - 10.5 K/uL   RBC 3.54 (L) 3.87 - 5.11 MIL/uL   Hemoglobin 10.2 (L) 12.0 - 15.0 g/dL   HCT 34.1 (L) 36.0 - 46.0 %   MCV 96.3 78.0 - 100.0 fL   MCH 28.8 26.0 - 34.0 pg   MCHC 29.9 (L) 30.0 - 36.0 g/dL   RDW 19.9 (H) 11.5 - 15.5 %   Platelets 215 150 - 400 K/uL    No results found.  Review of Systems  Constitutional: Positive for malaise/fatigue. Negative for chills and fever.   Respiratory: Negative for cough, sputum production and shortness of breath.   Cardiovascular: Positive for leg swelling. Negative for orthopnea and claudication.  Gastrointestinal: Negative for nausea and vomiting.  Neurological: Positive for weakness.   Blood pressure (!) 113/49, pulse (!) 59, temperature 97.9 F (36.6 C), temperature source Oral, resp. rate 17, height '5\' 8"'$  (1.727 m), weight 88 kg (194 lb 0.1 oz), SpO2 100 %. Physical Exam  Constitutional: No distress.  Eyes: No scleral icterus.  Neck: No JVD present.  Cardiovascular: Normal rate and regular rhythm.  No murmur heard. Respiratory: No respiratory distress. She has no wheezes.  GI: She exhibits no distension. There is no tenderness.  Musculoskeletal: She exhibits edema.    Assessment/Plan: 1] end-stage renal disease: She is status post hemodialysis on Saturday.  Presently she denies any nausea or vomiting.  Her potassium is normal. 2] history of weakness: Possibly related to her hypotension and bradycardia. 3] history of diabetes 4] history of hypertension: Presently her blood pressure seems to be low normal ranging from high 80s-100. 5] anemia: Her hemoglobin is within her target goal 6] bone and mineral disorder: Her calcium is a range 7] fluid management: Presently patient has some trace edema on the leg but denies any difficulty breathing.  Seems to have no significant sign of fluid overload. Plan: We will make arrangement for dialysis today for 3-1/2 hours 2] we will try to remove about 2 L if systolic blood pressure remains above 90 3] we will check a renal panel and CBC in the morning.  1]  Chuck Caban S 01/31/2017, 8:13 AM

## 2017-01-31 NOTE — Progress Notes (Signed)
Present with [name] for grief support. Her husband of 37 years died quickly due to an advanced and a recent cancer diagnosis.  We discussed grief , the shock she may be experiencing and what might be helpful for her as she moves through her grief, sought to offer normalization of grief's effect on her health, emotions, spirituality and her relationships with family and friends. She shares she has good support and feels she will be able to move through her grief.  Brought her grief support materials also.

## 2017-01-31 NOTE — Progress Notes (Addendum)
PROGRESS NOTE  [name]  NID:[id number]  DOB: [birth year]  DOA: 01/28/2017 PCP: [name], MD  Brief Admission Hx:  [name] is a 71 y.o. female with medical history significant of allergic rhinitis, anemia, anxiety, depression, CAD, coronary artery disease, chronic combined systolic and diastolic CHF, chronic constipation, hypertension, GERD, hyperlipidemia, chronic back pain, osteoarthritis, type 2 diabetes, ESRD on hemodialysis, recently diagnosed with atrial fibrillation in September who is coming to the emergency department with complaints of worsening chronic generalized weakness.  MDM/Assessment & Plan:   1. Generalized weakness - symptoms started after her metoprolol dose was increased to 100 mg recently.  This was held on admission in addition to her amiodarone but she says she still feels very weak.  Pt is still very weak but likely multifactorial given recently started hemodialysis.  HD today.   2. Junctional bradycardia - Pt HR in 50s this morning.   Cardiology following.    3. Hyperlipidemia - resume home crestor 40 mg daily.   4. Essential Hypertension - very soft BPs noted.  Holding home BP meds.  Following closely.  5. CAD - no chest pain or SOB.  Beta blocker on hold for now.  Continuing isosorbide mononitrate 30 mg daily.  6. GERD - continue protonix 40 mg daily.  7. ESRD on  HD - recently started HD.  T, R, Sat --->Consult nephrology for HD today.   8. Type 2 DM with stage 4 CKD - insulin requiring, following closely, discontinued lantus due to low BS. 9. Hypoglycemia with hypoglycemia unawareness -  Discontinued lantus and all other insulin, FSBS Q4h ordered.  Encouraged eating and drinking.    10. Atrial Fibrillation with CHADVASC of 6 - warfarin per pharmacy, holding amiodarone and metoprolol for now.  Cards consult requested.  11. Elevated PT/INR - INR continuing to rise on no warfarin, no active bleeding, hold warfarin. Pharm D consulted to assist  with warfarin dosing.    DVT prophylaxis: warfarin Code Status: full  Family Communication: bedside Disposition Plan: TBD  Consultants:  cardiology   Subjective: Pt says she still feels very weak   Objective: Vitals:   01/30/17 1700 01/30/17 1900 01/30/17 2100 01/30/17 2200  BP: (!) 112/46 99/76 98/64  (!) 113/49  Pulse: 86 (!) 56 (!) 57 (!) 59  Resp:  15 19 17   Temp:      TempSrc:      SpO2: 100% 100% 100% 100%  Weight:      Height:        Intake/Output Summary (Last 24 hours) at 01/31/2017 0651 Last data filed at 01/30/2017 1200 Gross per 24 hour  Intake 900 ml  Output -  Net 900 ml   Filed Weights   01/28/17 2338 01/29/17 0300 01/30/17 0600  Weight: 83 kg (183 lb) 86.1 kg (189 lb 13.1 oz) 88 kg (194 lb 0.1 oz)   REVIEW OF SYSTEMS  As per history otherwise all reviewed and reported negative  Exam:  General exam: awake, alert, NAD, cooperative.  Respiratory system:  No increased work of breathing. Cardiovascular system: bradycardic.  Gastrointestinal system: Abdomen is nondistended, soft and nontender. Normal bowel sounds heard. Central nervous system: Alert and oriented. No focal neurological deficits. Extremities: no CCE.  Data Reviewed: Basic Metabolic Panel: Recent Labs  Lab 01/29/17 0017 01/30/17 1043  NA 133* 132*  K 4.6 5.8*  CL 91* 91*  CO2 25 25  GLUCOSE 390* 105*  BUN 46* 57*  CREATININE 5.51* 6.57*  CALCIUM 9.3 8.8*  MG 2.1  --    Liver Function Tests: No results for input(s): AST, ALT, ALKPHOS, BILITOT, PROT, ALBUMIN in the last 168 hours. No results for input(s): LIPASE, AMYLASE in the last 168 hours. No results for input(s): AMMONIA in the last 168 hours. CBC: Recent Labs  Lab 01/29/17 0017 01/31/17 0529  WBC 6.1 5.5  NEUTROABS 4.7  --   HGB 10.7* 10.2*  HCT 37.2 34.1*  MCV 99.2 96.3  PLT 214 215   Cardiac Enzymes: Recent Labs  Lab 01/29/17 0723 01/29/17 1214  TROPONINI 0.10* 0.11*   CBG (last 3)  Recent Labs     01/30/17 1158 01/30/17 1642 01/30/17 2131  GLUCAP 161* 170* 122*   No results found for this or any previous visit (from the past 240 hour(s)).   Studies: No results found.   Scheduled Meds: . calcitRIOL  0.5 mcg Oral Daily  . ferrous sulfate  325 mg Oral Q breakfast  . fluticasone  1 spray Each Nare Daily  . insulin aspart  0-9 Units Subcutaneous TID WC  . isosorbide mononitrate  30 mg Oral BID  . latanoprost  1 drop Both Eyes QHS  . meclizine  12.5 mg Oral BID  . mouth rinse  15 mL Mouth Rinse BID  . pantoprazole  40 mg Oral Daily  . rosuvastatin  10 mg Oral QHS  . Warfarin - Pharmacist Dosing Inpatient   Does not apply Q24H   Continuous Infusions:  Principal Problem:   Junctional bradycardia Active Problems:   Hyperlipemia   Essential hypertension   Coronary artery disease   GERD   Type 2 diabetes mellitus with stage 4 chronic kidney disease, with long-term current use of insulin (HCC)   Elevated troponin   Atrial fibrillation ([facility])   Irwin Brakeman, MD, FAAFP [facility] Pager [phone]  If 7PM-7AM, please contact night-coverage [email or web address] Password [email or web address] 01/31/2017, 6:51 AM    LOS: 2 days

## 2017-01-31 NOTE — Clinical Social Work Note (Signed)
Clinical Social Work Assessment  Patient Details  Name: [name] MRN: [id number] Date of Birth: 1945-04-19  Date of referral:  01/31/17               Reason for consult:  Facility Placement, Discharge Planning                Permission sought to share information with:    Permission granted to share information::     Name::        Agency::     Relationship::     Contact Information:  daughters were at bedside.   Housing/Transportation Living arrangements for the past 2 months:  [place], [place] of Information:  Patient, Adult Children Patient Interpreter Needed:  None Criminal Activity/Legal Involvement Pertinent to Current Situation/Hospitalization:  No - Comment as needed Significant Relationships:  Adult Children Lives with:  Adult Children Do you feel safe going back to the place where you live?  Yes Need for family participation in patient care:  Yes (Comment)  Care giving concerns:  No concerns identified at baseline.    Social Worker assessment / plan:  Patient states that she uses a walker at baseline, lives with one of her daughters and they assist her with ADLs as needed. Patient states that she has [facility] with OT, PT, nurse and aid services. She states she was told that they would increase from three days per week to five days per week. Patient reported that she buried her husband last week and this had impacted her emotionally. LCSW provided brief supportive counseling.  LCSW discussed discharge planning. Patient stated that she just recently got out of rehab at [facility] in [place], [place]. The end of Oct. And that she may consider going back. She stated that right now she is leaning towards going home at discharge. She did give permission for referral to be sent to [facility].   Employment status:  Retired Advertising copywriter, [employer] PT Recommendations:  [place] / Referral to  community resources:  [place]  Patient/Family's Response to care:  Patient is considering SNF.   Patient/Family's Understanding of and Emotional Response to Diagnosis, Current Treatment, and Prognosis:  Patient and family understand patient's diagnosis, treatment and prognosis.   Emotional Assessment Appearance:  Appears stated age Attitude/Demeanor/Rapport:    Affect (typically observed):  Accepting, Calm Orientation:  Oriented to Self, Oriented to Place, Oriented to  Time, Oriented to Situation Alcohol / Substance use:  Not Applicable Psych involvement (Current and /or in the community):  No (Comment)  Discharge Needs  Concerns to be addressed:  Care Coordination Readmission within the last 30 days:  Yes Current discharge risk:  None Barriers to Discharge:  No Barriers Identified   Ihor Gully, LCSW 01/31/2017, 12:02 PM

## 2017-01-31 NOTE — Progress Notes (Signed)
Progress Note  Patient Name: [name] Date of Encounter: 01/31/2017  Primary Cardiologist: Kate Sable, MD  Subjective   Continues to feel weak and tired.  Denies any chest pain. Patient states she buried her husband this past Friday and has been having difficulty with depression and some confusion since that time.   Inpatient Medications    Scheduled Meds: . calcitRIOL  0.5 mcg Oral Daily  . ferrous sulfate  325 mg Oral Q breakfast  . fluticasone  1 spray Each Nare Daily  . isosorbide mononitrate  30 mg Oral BID  . latanoprost  1 drop Both Eyes QHS  . meclizine  12.5 mg Oral BID  . mouth rinse  15 mL Mouth Rinse BID  . pantoprazole  40 mg Oral Daily  . rosuvastatin  10 mg Oral QHS  . Warfarin - Pharmacist Dosing Inpatient   Does not apply Q24H    PRN Meds: albuterol, ALPRAZolam, cyclobenzaprine, loratadine, nitroGLYCERIN   Vital Signs    Vitals:   01/30/17 2200 01/31/17 0000 01/31/17 0400 01/31/17 0500  BP: (!) 113/49     Pulse: (!) 59     Resp: 17     Temp:  98.2 F (36.8 C) 97.9 F (36.6 C)   TempSrc:  Oral Oral   SpO2: 100%     Weight:    194 lb 0.1 oz (88 kg)  Height:        Intake/Output Summary (Last 24 hours) at 01/31/2017 0942 Last data filed at 01/30/2017 1200 Gross per 24 hour  Intake 240 ml  Output -  Net 240 ml   Filed Weights   01/29/17 0300 01/30/17 0600 01/31/17 0500  Weight: 189 lb 13.1 oz (86.1 kg) 194 lb 0.1 oz (88 kg) 194 lb 0.1 oz (88 kg)    Telemetry    Sinus bradycardia, rates in the 50-60 range. No pauses.  Personally Reviewed  Physical Exam   GEN: No acute distress.   Neck: No JVD Cardiac: RRR 1/6 systolic murmur with diastolic murmur, no gallops.  Respiratory: Clear to auscultation bilaterally. GI: Soft, nontender, non-distended  MS: 1+ edema; No deformity. Dialysis catheter left upper arm, and external catheter right upper chest.  Neuro:  Nonfocal  Psych: Flat affect   Labs    Chemistry Recent  Labs  Lab 01/29/17 0017 01/30/17 1043 01/31/17 0529  NA 133* 132* 133*  K 4.6 5.8* 5.1  CL 91* 91* 92*  CO2 25 25 27   GLUCOSE 390* 105* 48*  BUN 46* 57* 67*  CREATININE 5.51* 6.57* 7.36*  CALCIUM 9.3 8.8* 9.2  PROT  --   --  6.9  ALBUMIN  --   --  2.8*  AST  --   --  71*  ALT  --   --  36  ALKPHOS  --   --  194*  BILITOT  --   --  0.3  GFRNONAA 7* 6* 5*  GFRAA 8* 7* 6*  ANIONGAP 17* 16* 14     Hematology Recent Labs  Lab 01/29/17 0017 01/31/17 0529  WBC 6.1 5.5  RBC 3.75* 3.54*  HGB 10.7* 10.2*  HCT 37.2 34.1*  MCV 99.2 96.3  MCH 28.5 28.8  MCHC 28.8* 29.9*  RDW 20.0* 19.9*  PLT 214 215    Cardiac Enzymes Recent Labs  Lab 01/29/17 0723 01/29/17 1214  TROPONINI 0.10* 0.11*    Recent Labs  Lab 01/29/17 0034  TROPIPOC 0.09*     Radiology  No results found.  Cardiac Studies   Echocardiogram 11/11/2016: Left ventricle: The cavity size was normal. Wall thickness was increased in a pattern of moderate LVH. Systolic function was moderately to severely reduced. The estimated ejection fraction was in the range of 30% to 35%. Features are consistent with a pseudonormal left ventricular filling pattern, with concomitant abnormal relaxation and increased filling pressure (grade 2 diastolic dysfunction). Doppler parameters are consistent with high ventricular filling pressure. - Regional wall motion abnormality: Hypokinesis of the mid inferior and basal-mid inferolateral myocardium. - Aortic valve: Mildly calcified annulus. Trileaflet; mildly thickened leaflets. Valve area (VTI): 1.57 cm^2. Valve area (Vmax): 1.57 cm^2. Valve area (Vmean): 1.51 cm^2. - Mitral valve: Mildly calcified annulus. Mildly thickened leaflets . There was mild regurgitation. - Left atrium: The atrium was severely dilated. - Right atrium: The atrium was moderately dilated. - Pulmonary arteries: Systolic pressure was moderately increased. PA peak pressure:  44 mm Hg (S).  Cardiac Catheterization: 12/02/2015: Conclusion     Mid LAD lesion, 60 %stenosed.  Prox Cx to Mid Cx lesion, 99 %stenosed.  Mid RCA lesion, 0 %stenosed at site of prior stent  Dist RCA lesion, 30%stenosed at site of prior stent.  Prox RCA lesion, 40 %stenosed.  Ost Cx to Prox Cx lesion, 80 %stenosed.  LV end diastolic pressure is moderately elevated.  1. 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 Patent stents in the mid RCA and distal RCA/PDA 3. Moderate mid LAD disease- unchanged from prior studies. 4. Elevated LVEDP  Plan: medical therapy with diuresis for CHF.     Patient Profile     71 y.o. female admitted with generalized weakness, found to have significant bradycardia with junctional rhythm.  Patient has been taking high-dose metoprolol 100 mg daily by mistake, as she had been recently discharged from [facility] on 25 mg daily.  Other history includes chronic combined systolic and diastolic heart failure, discharged from [facility] 01/17/2017, coronary artery disease with CTO of the left circumflex, stents to the mid RCA and distal RCA, paroxysmal atrial fibrillation, on amiodarone, (not on Coumadin presently due to elevated INR), end-stage renal disease.  Assessment & Plan    1.  Junctional rhythm with bradycardia: Likely from taking higher dose of metoprolol at 100 mg by mistake instead of prescribed dose of 25 mg on recent discharge from the hospital.  Amiodarone and metoprolol are currently on hold.  She remains bradycardic. Waiting for further wash-out.  2.  Chronic mixed CHF: Breathing status is stable, some evidence of lower extremity edema is noted.  Volume status managed by hemodialysis   3.  Coronary artery disease: Most recent cardiac catheterization revealed single-vessel occlusive CAD, with CTO of the left circumflex, patent stents in the mid and distal RCA/PDA.  She denies symptoms of chest pain.  4.   Paroxysmal atrial fibrillation: Patient remains in sinus bradycardia.  She had been on amiodarone, and Coumadin.  INR is elevated and Coumadin is on hold currently.  INR this a.m. 5.64.  5. ESRD: Followed by Dr. Hinda Lenis  Dialysis is delayed due to hypotension. Creatinine 7.36.   Signed, Phill Myron. West Pugh, ANP, AACC   01/31/2017, 9:42 AM     Attending note:  Patient seen and examined.  Reviewed cardiology consultation by Dr. Harrington Challenger and discussed the case with Ms. West Pugh.  Both metoprolol and amiodarone remain on hold at this time.  Current heart rate is in the 50s with telemetry showing sinus bradycardia,  personally reviewed.  She has had no pauses.  On examination patient is in no distress.  Recent systolic blood pressures running 100-110.  Heart rate in the 50s-60s.  Lungs exhibit decreased breath sounds without crackles.  Cardiac exam with RRR and 2/6 systolic murmur, no gallop.  Lab work shows potassium 5.1, BUN 67, creatinine 7.36, hemoglobin 10.2, INR 5.64.  Patient with junctional and sinus bradycardia, heart rate currently 50s-60s with both metoprolol and amiodarone on hold for further washout.  Coumadin also held with supratherapeutic INR.  We do not plan to resume amiodarone or metoprolol as yet, continue to follow telemetry.  Continue Imdur and Crestor for management of ischemic heart disease.  Satira Sark, M.D., F.A.C.C.

## 2017-02-01 ENCOUNTER — Encounter: Payer: BLUE CROSS/BLUE SHIELD | Admitting: Vascular Surgery

## 2017-02-01 LAB — GLUCOSE, CAPILLARY
GLUCOSE-CAPILLARY: 79 mg/dL (ref 65–99)
GLUCOSE-CAPILLARY: 221 mg/dL — AB (ref 65–99)
GLUCOSE-CAPILLARY: 262 mg/dL — AB (ref 65–99)
Glucose-Capillary: 122 mg/dL — ABNORMAL HIGH (ref 65–99)
Glucose-Capillary: 98 mg/dL (ref 65–99)
Glucose-Capillary: 139 mg/dL — ABNORMAL HIGH (ref 65–99)
Glucose-Capillary: 157 mg/dL — ABNORMAL HIGH (ref 65–99)

## 2017-02-01 LAB — COMPREHENSIVE METABOLIC PANEL
ALK PHOS: 216 U/L — AB (ref 38–126)
ALT: 40 U/L (ref 14–54)
AST: 68 U/L — ABNORMAL HIGH (ref 15–41)
Albumin: 2.7 g/dL — ABNORMAL LOW (ref 3.5–5.0)
Anion gap: 13 (ref 5–15)
BILIRUBIN TOTAL: 0.6 mg/dL (ref 0.3–1.2)
BUN: 38 mg/dL — ABNORMAL HIGH (ref 6–20)
CALCIUM: 8.6 mg/dL — AB (ref 8.9–10.3)
CO2: 26 mmol/L (ref 22–32)
CREATININE: 4.77 mg/dL — AB (ref 0.44–1.00)
Chloride: 96 mmol/L — ABNORMAL LOW (ref 101–111)
GFR calc non Af Amer: 8 mL/min — ABNORMAL LOW (ref >60)
GFR, EST AFRICAN AMERICAN: 10 mL/min — AB (ref >60)
GLUCOSE: 100 mg/dL — AB (ref 65–99)
Potassium: 4 mmol/L (ref 3.5–5.1)
SODIUM: 135 mmol/L (ref 135–145)
Total Protein: 6.9 g/dL (ref 6.5–8.1)

## 2017-02-01 LAB — PROTIME-INR
INR: 3.63
Prothrombin Time: 35.9 seconds — ABNORMAL HIGH (ref 11.4–15.2)

## 2017-02-01 LAB — PHOSPHORUS: Phosphorus: 5 mg/dL — ABNORMAL HIGH (ref 2.5–4.6)

## 2017-02-01 LAB — CBC
HCT: 34.3 % — ABNORMAL LOW (ref 36.0–46.0)
Hemoglobin: 10 g/dL — ABNORMAL LOW (ref 12.0–15.0)
MCH: 28.2 pg (ref 26.0–34.0)
MCHC: 29.2 g/dL — AB (ref 30.0–36.0)
MCV: 96.9 fL (ref 78.0–100.0)
PLATELETS: 199 10*3/uL (ref 150–400)
RBC: 3.54 MIL/uL — AB (ref 3.87–5.11)
RDW: 19.9 % — ABNORMAL HIGH (ref 11.5–15.5)
WBC: 4.7 10*3/uL (ref 4.0–10.5)

## 2017-02-01 LAB — CORTISOL-AM, BLOOD: CORTISOL - AM: 19.3 ug/dL (ref 6.7–22.6)

## 2017-02-01 MED ORDER — AMIODARONE HCL 200 MG PO TABS
200.0000 mg | ORAL_TABLET | Freq: Every day | ORAL | Status: DC
  Administered 2017-02-01 – 2017-02-02 (×2): 200 mg via ORAL
  Filled 2017-02-01: qty 1

## 2017-02-01 MED ORDER — ACETAMINOPHEN 325 MG PO TABS
325.0000 mg | ORAL_TABLET | Freq: Four times a day (QID) | ORAL | Status: DC | PRN
  Administered 2017-02-01: 650 mg via ORAL
  Filled 2017-02-01: qty 2

## 2017-02-01 NOTE — Progress Notes (Signed)
[facility] for Coumadin (home med) Indication: atrial fibrillation  Allergies  Allergen Reactions  . Ace Inhibitors Cough  . Amlodipine Swelling    UNSPECIFIED EDEMA   . Codeine Rash   Patient Measurements: Height: 5\' 8"  (172.7 cm) Weight: 194 lb 3.6 oz (88.1 kg) IBW/kg (Calculated) : 63.9  Vital Signs: Temp: 98 F (36.7 C) (11/28 0620) Temp Source: Oral (11/28 0620) BP: 122/61 (11/28 0620) Pulse Rate: 80 (11/28 0620)  Labs: Recent Labs    01/29/17 1214 01/30/17 0457 01/30/17 1043 01/31/17 0529 02/01/17 0457 02/01/17 0655  HGB  --   --   --  10.2* 10.0*  --   HCT  --   --   --  34.1* 34.3*  --   PLT  --   --   --  215 199  --   LABPROT  --  45.9*  --  50.6*  --  35.9*  INR  --  4.97*  --  5.64*  --  3.63  CREATININE  --   --  6.57* 7.36* 4.77*  --   TROPONINI 0.11*  --   --   --   --   --    Estimated Creatinine Clearance: 12.4 mL/min (A) (by C-G formula based on SCr of 4.77 mg/dL (H)).  Medical History: Past Medical History:  Diagnosis Date  . Acute renal failure superimposed on stage 4 chronic kidney disease ([place]) 07/04/2015  . Allergic rhinitis   . Anemia   . Anxiety   . CAD in native artery    NSTEMI 07/2015 s/p DES to RCA and posterior PDA, PCI 10/2015 with scoring balloon to 85% ISR of distal RCA), known LAD/Cx disease treated medically  . Carotid artery disease ([place])    Mild bilateral carotid disease (1-39% 07/2015)  . Chronic combined systolic and diastolic CHF (congestive heart failure) ([place])   . CKD (chronic kidney disease), stage IV ([place])   . Constipation   . Essential hypertension   . GERD (gastroesophageal reflux disease)   . History of hysterectomy   . Hyperlipidemia   . Low back pain   . Obesity   . 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 Osteoarthritis   . Overactive bladder   . PVC's (premature ventricular contractions)   .  Type 2 diabetes mellitus (HCC)    Medications:  Medications Prior to Admission  Medication Sig Dispense Refill Last Dose  . albuterol (PROVENTIL HFA;VENTOLIN HFA) 108 (90 Base) MCG/ACT inhaler Inhale 1-2 puffs into the lungs every 6 (six) hours as needed for wheezing or shortness of breath. 1 Inhaler 0 unknnown at prn  . albuterol (PROVENTIL) (2.5 MG/3ML) 0.083% nebulizer solution Take 3 mLs (2.5 mg total) by nebulization every 6 (six) hours as needed for wheezing or shortness of breath. 75 mL 1 unknown at prn  . ALPRAZolam (XANAX) 0.5 MG tablet Take 0.5 mg by mouth at bedtime.   12/22/2016  . amiodarone (PACERONE) 200 MG tablet Take 1 tablet (200 mg total) daily by mouth. 30 tablet 0   . calcitRIOL (ROCALTROL) 0.5 MCG capsule Take 1 capsule (0.5 mcg total) daily by mouth. 30 capsule 0   . cetirizine (ZYRTEC) 10 MG tablet Take 5 mg by mouth daily.  3 12/23/2016  . clopidogrel (PLAVIX) 75 MG tablet Take 1 tablet (75 mg total) by mouth daily. 30 tablet 0 12/23/2016  . cyclobenzaprine (FLEXERIL) 5 MG tablet Take  5 mg by mouth as needed (lower back pain). osteoporosis with out current pathological fracture for 14 days  0 unknown at prn  . ferrous sulfate 325 (65 FE) MG tablet Take 325 mg by mouth daily with breakfast.    12/23/2016  . fluticasone (FLONASE) 50 MCG/ACT nasal spray Place 1 spray into both nostrils daily.   12/23/2016  . insulin glargine (LANTUS) 100 UNIT/ML injection Inject 0.15 mLs (15 Units total) at bedtime into the skin. 10 mL 11   . isosorbide mononitrate (IMDUR) 30 MG 24 hr tablet Take 1 tablet (30 mg total) 2 (two) times daily by mouth. 60 tablet 0   . latanoprost (XALATAN) 0.005 % ophthalmic solution Place 1 drop into both eyes at bedtime.    12/22/2016  . meclizine (ANTIVERT) 25 MG tablet Take 12.5 mg by mouth 2 (two) times daily.    12/23/2016  . metoprolol succinate (TOPROL-XL) 25 MG 24 hr tablet Take 1 tablet (25 mg total) at bedtime by mouth. 30 tablet 0   . nitroGLYCERIN  (NITROSTAT) 0.4 MG SL tablet Place 1 tablet (0.4 mg total) under the tongue every 5 (five) minutes as needed for chest pain. 25 tablet 2 unknown at prn  . pantoprazole (PROTONIX) 40 MG tablet Take 40 mg by mouth daily.    12/23/2016  . rosuvastatin (CRESTOR) 40 MG tablet Take 1 tablet (40 mg total) by mouth at bedtime. 30 tablet 0 12/22/2016  . diazepam (VALIUM) 5 MG tablet Take 1 tablet (5 mg total) by mouth every 12 (twelve) hours as needed for muscle spasms. (Patient not taking: Reported on 01/29/2017) 10 tablet 0 Not Taking at Unknown time  . warfarin (COUMADIN) 7.5 MG tablet Take 1 tablet (7.5 mg total) daily at 6 PM by mouth. (Patient not taking: Reported on 01/29/2017) 30 tablet 0 Not Taking at Unknown time    Assessment: 71yo female on chronic Coumadin for h/o afib.  INR remains SUPRAtherapeutic today but is trending down  Goal of Therapy:  INR 2-3 Monitor platelets by anticoagulation protocol: Yes   Plan:  HOLD coumadin today due to elevated INR Allow INR to trend down Check INR daily  Collins Dimaria Poteet 02/01/2017,10:53 AM

## 2017-02-01 NOTE — Progress Notes (Signed)
   Progress Note  Patient Name: [name] Date of Encounter: 02/01/2017  Primary Cardiologist: Dr. Kate Sable  Please refer to rounding note from yesterday.  I reviewed interval telemetry which shows sinus rhythm and sinus bradycardia.  Heart rates are generally in the 60s-70s at this point.  INR is down to 3.63.  Would continue Coumadin management per pharmacy.  Continue Crestor and Imdur.  Resume amiodarone at 200 mg daily but hold off on Toprol-XL at this time.  Might be able to resume Toprol-XL at 12.5 mg eventually depending on heart rate overall.  No further cardiac workup is planned at this point.  She should keep follow-up with Dr. Bronson Ing as an outpatient.  We will sign off for now.  Signed, Rozann Lesches, MD  02/01/2017, 9:57 AM

## 2017-02-01 NOTE — Progress Notes (Signed)
Physical Therapy Treatment Patient Details Name: [name] MRN: [id number] DOB: 03-27-1945 Today's Date: 02/01/2017    History of Present Illness  [name] is a 71 y.o. female with medical history significant of allergic rhinitis, anemia, anxiety, depression, CAD, coronary artery disease, chronic combined systolic and diastolic CHF, chronic constipation, hypertension, GERD, hyperlipidemia, chronic back pain, osteoarthritis, type 2 diabetes, ESRD on hemodialysis, recently diagnosed with atrial fibrillation in September who is coming to the emergency department with complaints of worsening chronic generalized weakness.  Her metoprolol was apparently increased from 25-100 mg p.o. daily.  She is also taking amiodarone 200 mg p.o.  She denies chest pain, palpitations, diaphoresis, PND orthopnea, pitting edema of the lower extremities, she recently lost her husband.  His funeral was yesterday.    PT Comments    Pt supine in bed and willing to participate.  Visitors in room reports increased drowsiness following recent medication from RN.  RN informed and vitals assessed WFL.  Mod assistance and cueing for hand placement for bed mobility and transfers, cueing for handplacement for assistance and safety.  EOS pt left in chair with call bell within reach and visitors present in room.  Pt limited by fatigue with activities, no reports of pain.  Stated she wishes to make it to the door with gait training tomorrow, personal goal.     Follow Up Recommendations        Equipment Recommendations       Recommendations for Other Services       Precautions / Restrictions Precautions Precautions: Fall    Mobility  Bed Mobility Overal bed mobility: Needs Assistance Bed Mobility: Supine to Sit;Rolling Rolling: Supervision Sidelying to sit: Min guard       General bed mobility comments: Min guard to scoot forward, cueing for hand placement  Transfers Overall transfer level: Needs  assistance Equipment used: Rolling walker (2 wheeled) Transfers: Sit to/from [facility] Sit to Stand: Min assist Stand pivot transfers: Min assist       General transfer comment: Cueing for hand placement from commode to standing for safety.  Pt reports knees feel shaky upon standing.  Ambulation/Gait Ambulation/Gait assistance: Mod assist Ambulation Distance (Feet): 10 Feet Assistive device: Rolling walker (2 wheeled) Gait Pattern/deviations: Decreased step length - right;Decreased step length - left;Decreased stride length Gait velocity: decreased Gait velocity interpretation: Below normal speed for age/gender General Gait Details: slow labored movement taking side steps, forward/backwards - limited secondary to fatigue/decreased balance   Stairs            Wheelchair Mobility    Modified Rankin (Stroke Patients Only)       Balance                                            Cognition Arousal/Alertness: Awake/alert Behavior During Therapy: WFL for tasks assessed/performed(drowsy through session.) Overall Cognitive Status: Within Functional Limits for tasks assessed                                 General Comments: Pt very drowsy at entrance, visitors in room reports change in status, pt feels drowsiness due to Tylonel given by RN earlier.  RN checked vitals and sugar levels prior tx      Exercises      General Comments  Pertinent Vitals/Pain Pain Assessment: No/denies pain    Home Living                      Prior Function            PT Goals (current goals can now be found in the care plan section) Acute Rehab PT Goals Patient Stated Goal: Return home Progress towards PT goals: Progressing toward goals    Frequency           PT Plan      Co-evaluation              AM-PAC PT "6 Clicks" Daily Activity  Outcome Measure                   End of Session                Time: 1350-1425 PT Time Calculation (min) (ACUTE ONLY): 35 min  Charges:  $Therapeutic Activity: 23-37 mins                    G CodesIhor Austin, LPTA; CBIS [phone]   Aldona Lento 02/01/2017, 4:57 PM

## 2017-02-01 NOTE — Progress Notes (Signed)
PROGRESS NOTE  [name]  MGQ:[id number]  DOB: [birth year]  DOA: 01/28/2017 PCP: [name], MD  Brief Admission Hx:  [name] is a 71 y.o. female with medical history significant of allergic rhinitis, anemia, anxiety, depression, CAD, coronary artery disease, chronic combined systolic and diastolic CHF, chronic constipation, hypertension, GERD, hyperlipidemia, chronic back pain, osteoarthritis, type 2 diabetes, ESRD on hemodialysis, recently diagnosed with atrial fibrillation in September who is coming to the emergency department with complaints of worsening chronic generalized weakness.  MDM/Assessment & Plan:   1. Generalized weakness - symptoms started after her metoprolol dose was increased to 100 mg recently.  This was held on admission in addition to her amiodarone but she says she still feels very weak.  Pt is still very weak but likely multifactorial given recently started hemodialysis.  HD yesterday.    2. Junctional bradycardia - Pt HR improving.   Cardiology recommended resuming amiodarone but hold metoprolol and follow up outpatient with Dr. Bronson Ing.   3. Hyperlipidemia - continue home crestor 10 mg daily.   4. Essential Hypertension - BPs improving.    5. CAD - no chest pain or SOB.  Beta blocker on hold for now.  Continuing isosorbide mononitrate 30 mg daily.  6. GERD - continue protonix 40 mg daily.  7. ESRD on  HD - recently started HD.  T, R, Sat --->Consulted nephrology for HD. If she is discharged in the AM she will do outpatient HD tomorrow afternoon, otherwise HD in hospital tomorrow.   8. Type 2 DM with stage 4 CKD - Discontinued all insulin due to hypoglycemia.  Would not restart insulin at discharge.   9. Hypoglycemia with hypoglycemia unawareness -  Discontinued lantus and all other insulins.  Would not restart insulin at discharge.    10. Atrial Fibrillation with CHADVASC of 6 - warfarin per pharmacy, amiodarone resumed.  Cards consult  requested.  11. Elevated PT/INR - INR trending down, no active bleeding, holding warfarin. Pharm D consulted to assist with warfarin dosing.    DVT prophylaxis: warfarin Code Status: full  Family Communication: bedside Disposition Plan: Hopeful to discharge tomorrow.  Pt deciding about SNF  Consultants:  cardiology   Subjective: Pt says she still feels very weak   Objective: Vitals:   01/31/17 2200 01/31/17 2230 01/31/17 2240 02/01/17 0620  BP: 125/61 127/65 132/63 122/61  Pulse: 77 76 76 80  Resp:   20 18  Temp:   97.7 F (36.5 C) 98 F (36.7 C)  TempSrc:   Oral Oral  SpO2:    100%  Weight:    88.1 kg (194 lb 3.6 oz)  Height:        Intake/Output Summary (Last 24 hours) at 02/01/2017 1044 Last data filed at 02/01/2017 0900 Gross per 24 hour  Intake 480 ml  Output 2000 ml  Net -1520 ml   Filed Weights   01/31/17 0500 01/31/17 1845 02/01/17 0620  Weight: 88 kg (194 lb 0.1 oz) 88 kg (194 lb 0.1 oz) 88.1 kg (194 lb 3.6 oz)   REVIEW OF SYSTEMS  As per history otherwise all reviewed and reported negative  Exam:  General exam: awake, alert, NAD, cooperative.  Respiratory system:  BBS clear. No increased work of breathing. Cardiovascular system: irregular, bradycardic.  Gastrointestinal system: Abdomen is nondistended, soft and nontender. Normal bowel sounds heard. Central nervous system: Alert and oriented. No focal neurological deficits. Extremities: no CCE.  Data Reviewed: Basic Metabolic Panel: Recent Labs  Lab 01/29/17  0017 01/30/17 1043 01/31/17 0529 02/01/17 0457  NA 133* 132* 133* 135  K 4.6 5.8* 5.1 4.0  CL 91* 91* 92* 96*  CO2 25 25 27 26   GLUCOSE 390* 105* 48* 100*  BUN 46* 57* 67* 38*  CREATININE 5.51* 6.57* 7.36* 4.77*  CALCIUM 9.3 8.8* 9.2 8.6*  MG 2.1  --   --   --   PHOS  --   --   --  5.0*   Liver Function Tests: Recent Labs  Lab 01/31/17 0529 02/01/17 0457  AST 71* 68*  ALT 36 40  ALKPHOS 194* 216*  BILITOT 0.3 0.6  PROT  6.9 6.9  ALBUMIN 2.8* 2.7*   No results for input(s): LIPASE, AMYLASE in the last 168 hours. No results for input(s): AMMONIA in the last 168 hours. CBC: Recent Labs  Lab 01/29/17 0017 01/31/17 0529 02/01/17 0457  WBC 6.1 5.5 4.7  NEUTROABS 4.7  --   --   HGB 10.7* 10.2* 10.0*  HCT 37.2 34.1* 34.3*  MCV 99.2 96.3 96.9  PLT 214 215 199   Cardiac Enzymes: Recent Labs  Lab 01/29/17 0723 01/29/17 1214  TROPONINI 0.10* 0.11*   CBG (last 3)  Recent Labs    02/01/17 0100 02/01/17 0610 02/01/17 0815  GLUCAP 122* 98 79   Recent Results (from the past 240 hour(s))  MRSA PCR Screening     Status: None   Collection Time: 01/31/17  1:32 PM  Result Value Ref Range Status   MRSA by PCR NEGATIVE NEGATIVE Final    Comment:        The GeneXpert MRSA Assay (FDA approved for NASAL specimens only), is one component of a comprehensive MRSA colonization surveillance program. It is not intended to diagnose MRSA infection nor to guide or monitor treatment for MRSA infections.      Studies: No results found.   Scheduled Meds: . amiodarone  200 mg Oral Daily  . calcitRIOL  0.5 mcg Oral Daily  . ferrous sulfate  325 mg Oral Q breakfast  . fluticasone  1 spray Each Nare Daily  . isosorbide mononitrate  30 mg Oral BID  . latanoprost  1 drop Both Eyes QHS  . meclizine  12.5 mg Oral BID  . mouth rinse  15 mL Mouth Rinse BID  . pantoprazole  40 mg Oral Daily  . rosuvastatin  10 mg Oral QHS  . senna-docusate  1 tablet Oral BID  . Warfarin - Pharmacist Dosing Inpatient   Does not apply Q24H   Continuous Infusions: . sodium chloride    . sodium chloride      Principal Problem:   Junctional bradycardia Active Problems:   Hyperlipemia   Essential hypertension   Coronary artery disease   GERD   Type 2 diabetes mellitus with stage 4 chronic kidney disease, with long-term current use of insulin (HCC)   Elevated troponin   PAF (paroxysmal atrial fibrillation) ([place])   Sinus  bradycardia   ESRD on hemodialysis ([facility])   Irwin Brakeman, MD, FAAFP [facility] Pager [phone]  If 7PM-7AM, please contact night-coverage [email or web address] Password [email or web address] 02/01/2017, 10:44 AM    LOS: 3 days

## 2017-02-01 NOTE — Progress Notes (Signed)
[name]  MRN: [id number]  DOB/AGE: 1945/07/30 71 y.o.  Primary Care Physician:Hill, Berneta Sages, MD  Admit date: 01/28/2017  Chief Complaint:  Chief Complaint  Patient presents with  . Weakness    S-Pt presented on  01/28/2017 with  Chief Complaint  Patient presents with  . Weakness  .    Pt today feels better  Meds  . calcitRIOL  0.5 mcg Oral Daily  . ferrous sulfate  325 mg Oral Q breakfast  . fluticasone  1 spray Each Nare Daily  . isosorbide mononitrate  30 mg Oral BID  . latanoprost  1 drop Both Eyes QHS  . meclizine  12.5 mg Oral BID  . mouth rinse  15 mL Mouth Rinse BID  . pantoprazole  40 mg Oral Daily  . rosuvastatin  10 mg Oral QHS  . senna-docusate  1 tablet Oral BID  . Warfarin - Pharmacist Dosing Inpatient   Does not apply Q24H       Physical Exam: Vital signs in last 24 hours: Temp:  [97.7 F (36.5 C)-98 F (36.7 C)] 98 F (36.7 C) (11/28 0620) Pulse Rate:  [51-84] 80 (11/28 0620) Resp:  [14-20] 18 (11/28 0620) BP: (105-138)/(47-75) 122/61 (11/28 0620) SpO2:  [87 %-100 %] 100 % (11/28 0620) Weight:  [194 lb 0.1 oz (88 kg)-194 lb 3.6 oz (88.1 kg)] 194 lb 3.6 oz (88.1 kg) (11/28 0620) Weight change: 0 lb (0 kg) Last BM Date: 01/30/17  Intake/Output from previous day: 11/27 0701 - 11/28 0700 In: 240 [P.O.:240] Out: 2000  No intake/output data recorded.   Physical Exam: General- pt is awake,alert, oriented to time place and person Resp- No acute REsp distress, CTA B/L NO Rhonchi CVS- S1S2 regular in rate and rhythm GIT- BS+, soft, NT, ND EXT- trace LE Edema, Cyanosis Access-PC in situ  Lab Results: CBC Recent Labs    01/31/17 0529 02/01/17 0457  WBC 5.5 4.7  HGB 10.2* 10.0*  HCT 34.1* 34.3*  PLT 215 199    BMET Recent Labs    01/31/17 0529 02/01/17 0457  NA 133* 135  K 5.1 4.0  CL 92* 96*  CO2 27 26  GLUCOSE 48* 100*  BUN 67* 38*  CREATININE 7.36* 4.77*  CALCIUM 9.2 8.6*    MICRO Recent Results (from the  past 240 hour(s))  MRSA PCR Screening     Status: None   Collection Time: 01/31/17  1:32 PM  Result Value Ref Range Status   MRSA by PCR NEGATIVE NEGATIVE Final    Comment:        The GeneXpert MRSA Assay (FDA approved for NASAL specimens only), is one component of a comprehensive MRSA colonization surveillance program. It is not intended to diagnose MRSA infection nor to guide or monitor treatment for MRSA infections.       Lab Results  Component Value Date   PTH 270 (H) 12/27/2016   CALCIUM 8.6 (L) 02/01/2017   PHOS 5.0 (H) 02/01/2017               Impression: 1)Renal  ESRD on HD                Pt is on Tuesday/Thursday/Saturday schedule                Pt was last dialyzed yesterday                No need of HD today  2)HTN On Vasodilators.   3)Anemia In ESRD the  goal for HGb is 9--11. hgb at goal  4)CKD Mineral-Bone Disorder PTH acceptable. Secondary Hyperparathyroidismpresent.. Phosphorus at goal.   5)CVS-admitted with Junctional bradycardia Has hx of Afib PMD following  6)Electrolytes  Normokalemic  NOrmonatremic    7)Acid base Co2 at goal     Plan:   Will continue current care Will dialyze in am Will use 2k bath   [name],[name] 02/01/2017, 9:06 AM

## 2017-02-01 NOTE — Plan of Care (Signed)
Pt alert and oriented x4. Able to make needs known. No complaint of pain or distress at this. Dressings to BLE dry intact. Heel protectors in place. 22G to right wrist saline locked. O2 2L [place]. BP within normal limits. Call light within reach. Bed in lowest position. Will continue to monitor.

## 2017-02-02 LAB — COMPREHENSIVE METABOLIC PANEL
ALT: 33 U/L (ref 14–54)
ANION GAP: 12 (ref 5–15)
AST: 40 U/L (ref 15–41)
Albumin: 2.8 g/dL — ABNORMAL LOW (ref 3.5–5.0)
Alkaline Phosphatase: 207 U/L — ABNORMAL HIGH (ref 38–126)
BUN: 52 mg/dL — ABNORMAL HIGH (ref 6–20)
CHLORIDE: 93 mmol/L — AB (ref 101–111)
CO2: 28 mmol/L (ref 22–32)
Calcium: 9.1 mg/dL (ref 8.9–10.3)
Creatinine, Ser: 5.81 mg/dL — ABNORMAL HIGH (ref 0.44–1.00)
GFR calc non Af Amer: 7 mL/min — ABNORMAL LOW (ref >60)
GFR, EST AFRICAN AMERICAN: 8 mL/min — AB (ref >60)
Glucose, Bld: 269 mg/dL — ABNORMAL HIGH (ref 65–99)
Potassium: 5.1 mmol/L (ref 3.5–5.1)
SODIUM: 133 mmol/L — AB (ref 135–145)
Total Bilirubin: 0.5 mg/dL (ref 0.3–1.2)
Total Protein: 7.1 g/dL (ref 6.5–8.1)

## 2017-02-02 LAB — CBC
HEMATOCRIT: 34.1 % — AB (ref 36.0–46.0)
HEMOGLOBIN: 9.9 g/dL — AB (ref 12.0–15.0)
MCH: 28.4 pg (ref 26.0–34.0)
MCHC: 29 g/dL — ABNORMAL LOW (ref 30.0–36.0)
MCV: 98 fL (ref 78.0–100.0)
Platelets: 201 10*3/uL (ref 150–400)
RBC: 3.48 MIL/uL — ABNORMAL LOW (ref 3.87–5.11)
RDW: 20.1 % — ABNORMAL HIGH (ref 11.5–15.5)
WBC: 4.5 10*3/uL (ref 4.0–10.5)

## 2017-02-02 LAB — GLUCOSE, CAPILLARY
GLUCOSE-CAPILLARY: 258 mg/dL — AB (ref 65–99)
Glucose-Capillary: 259 mg/dL — ABNORMAL HIGH (ref 65–99)
Glucose-Capillary: 310 mg/dL — ABNORMAL HIGH (ref 65–99)
Glucose-Capillary: 164 mg/dL — ABNORMAL HIGH (ref 65–99)

## 2017-02-02 LAB — PROTIME-INR
INR: 2.51
PROTHROMBIN TIME: 26.9 s — AB (ref 11.4–15.2)

## 2017-02-02 MED ORDER — WARFARIN SODIUM 5 MG PO TABS
5.0000 mg | ORAL_TABLET | Freq: Once | ORAL | Status: AC
  Administered 2017-02-02: 5 mg via ORAL
  Filled 2017-02-02: qty 1

## 2017-02-02 MED ORDER — AMIODARONE HCL 200 MG PO TABS: 200.0000 mg | ORAL_TABLET | Freq: Every day | ORAL | 0 refills | Status: DC

## 2017-02-02 NOTE — Discharge Summary (Signed)
Physician Discharge Summary  [name] FHL:[id number] DOB: 06-28-1945 DOA: 01/28/2017  PCP: [name], MD  Admit date: 01/28/2017 Discharge date: 02/02/2017  Time spent: 45 minutes  Recommendations for Outpatient Follow-up:  -Will be discharged home today after dialysis session is completed. -Advised to follow-up with PCP in 2 weeks.  Discharge Diagnoses:  Principal Problem:   Junctional bradycardia Active Problems:   Hyperlipemia   Essential hypertension   Coronary artery disease   GERD   Type 2 diabetes mellitus with stage 4 chronic kidney disease, with long-term current use of insulin (HCC)   Elevated troponin   PAF (paroxysmal atrial fibrillation) (HCC)   Sinus bradycardia   ESRD on hemodialysis [facility])   Discharge Condition: Stable and improved  Filed Weights   02/01/17 0620 02/02/17 0628 02/02/17 1315  Weight: 88.1 kg (194 lb 3.6 oz) 91.9 kg (202 lb 9.6 oz) 91.9 kg (202 lb 9.6 oz)    History of present illness:  As per Dr. Olevia Bowens on 11/25: [name] is a 71 y.o. female with medical history significant of allergic rhinitis, anemia, anxiety, depression, CAD, coronary artery disease, chronic combined systolic and diastolic CHF, chronic constipation, hypertension, GERD, hyperlipidemia, chronic back pain, osteoarthritis, type 2 diabetes, ESRD on hemodialysis, recently diagnosed with atrial fibrillation in September who is coming to the emergency department with complaints of worsening chronic generalized weakness.  Her metoprolol was apparently increased from 25-100 mg p.o. daily.  She is also taking amiodarone 200 mg p.o.  She denies chest pain, palpitations, diaphoresis, PND orthopnea, pitting edema of the lower extremities, she recently lost her husband.  His funeral was yesterday.  ED Course: Initial vital signs temperature 35.8C, pulse 41, blood pressure 88/29 and O2 sat 92% on nasal cannula oxygen.  Workup in the emergency department show an  EKG changing from sinus bradycardia to junctional bradycardia in the high 30s and low 40s.  Troponin #1 was 0.09, but the patient has chronic elevated troponin due to ESRD.  WBC 6.1, hemoglobin 10.7 g/dL and platelets 214.  Differential was normal.  PT was 36.1 seconds and INR 3.65.  Magnesium was 2.1, glucose 390 mg, BUN 46 creatinine 5.51 calcium 9.3 mg/dL.  Sodium 133, potassium 4.6, chloride 91 CO2 25 mmol/L.     Hospital Course:   1. Generalized weakness - symptoms started after her metoprolol dose was increased to 100 mg recently.   Pt is still very weak, although she states she is improving, but likely multifactorial given recently started hemodialysis.   2. Junctional bradycardia - Pt HR improving.   Cardiology recommended resuming amiodarone but hold metoprolol and follow up outpatient with Dr. Bronson Ing.   3. Hyperlipidemia - continue home crestor 10 mg daily.   4. Essential Hypertension - BP well controlled.  5. CAD - no chest pain or SOB.  Beta blocker on hold for now.  Continuing isosorbide mononitrate 30 mg daily.  6. GERD - continue protonix 40 mg daily.  7. ESRD on  HD - recently started HD.  T, R, Sat --->Consulted nephrology for HD.  8. Type 2 DM with stage 4 CKD - Discontinued all insulin due to hypoglycemia.  Would not restart insulin at discharge.   9. Hypoglycemia with hypoglycemia unawareness -  Discontinued lantus and all other insulins.    Would have close follow-up with PCP for continued diabetic management.   10. Atrial Fibrillation with CHADVASC of 6 - warfarin per pharmacy, amiodarone resumed.   Elevated PT/INR - INR trending  down, no active bleeding, continue Coumadin.     Procedures:  None  Consultations:  Cardiology  Discharge Instructions  Discharge Instructions    Diet - low sodium heart healthy   Complete by:  As directed    Increase activity slowly   Complete by:  As directed      Allergies as of 02/02/2017      Reactions   Ace Inhibitors  Cough   Amlodipine Swelling   UNSPECIFIED EDEMA   Codeine Rash      Medication List    STOP taking these medications   insulin glargine 100 UNIT/ML injection Commonly known as:  LANTUS   metoprolol succinate 25 MG 24 hr tablet Commonly known as:  TOPROL-XL     TAKE these medications   albuterol 108 (90 Base) MCG/ACT inhaler Commonly known as:  PROVENTIL HFA;VENTOLIN HFA Inhale 1-2 puffs into the lungs every 6 (six) hours as needed for wheezing or shortness of breath.   albuterol (2.5 MG/3ML) 0.083% nebulizer solution Commonly known as:  PROVENTIL Take 3 mLs (2.5 mg total) by nebulization every 6 (six) hours as needed for wheezing or shortness of breath.   ALPRAZolam 0.5 MG tablet Commonly known as:  XANAX Take 0.5 mg by mouth at bedtime.   amiodarone 200 MG tablet Commonly known as:  PACERONE Take 1 tablet (200 mg total) by mouth daily.   calcitRIOL 0.5 MCG capsule Commonly known as:  ROCALTROL Take 1 capsule (0.5 mcg total) daily by mouth.   cetirizine 10 MG tablet Commonly known as:  ZYRTEC Take 5 mg by mouth daily.   clopidogrel 75 MG tablet Commonly known as:  PLAVIX Take 1 tablet (75 mg total) by mouth daily.   cyclobenzaprine 5 MG tablet Commonly known as:  FLEXERIL Take 5 mg by mouth as needed (lower back pain). osteoporosis with out current pathological fracture for 14 days   diazepam 5 MG tablet Commonly known as:  VALIUM Take 1 tablet (5 mg total) by mouth every 12 (twelve) hours as needed for muscle spasms.   ferrous sulfate 325 (65 FE) MG tablet Take 325 mg by mouth daily with breakfast.   fluticasone 50 MCG/ACT nasal spray Commonly known as:  FLONASE Place 1 spray into both nostrils daily.   isosorbide mononitrate 30 MG 24 hr tablet Commonly known as:  IMDUR Take 1 tablet (30 mg total) 2 (two) times daily by mouth.   latanoprost 0.005 % ophthalmic solution Commonly known as:  XALATAN Place 1 drop into both eyes at bedtime.   meclizine  25 MG tablet Commonly known as:  ANTIVERT Take 12.5 mg by mouth 2 (two) times daily.   nitroGLYCERIN 0.4 MG SL tablet Commonly known as:  NITROSTAT Place 1 tablet (0.4 mg total) under the tongue every 5 (five) minutes as needed for chest pain. Notes to patient:  DO NOT TAKE MORE THAN THREE TABLETS.  IF CHEST PAIN DOES NOT GO AWAY AFTER THREE TABLETS, CALL 911 OR GO TO THE EMERGENCY DEPARTMENT   pantoprazole 40 MG tablet Commonly known as:  PROTONIX Take 40 mg by mouth daily.   rosuvastatin 40 MG tablet Commonly known as:  CRESTOR Take 1 tablet (40 mg total) by mouth at bedtime.   warfarin 7.5 MG tablet Commonly known as:  COUMADIN Take as directed. If you are unsure how to take this medication, talk to your nurse or doctor. Original instructions:  Take 1 tablet (7.5 mg total) daily at 6 PM by mouth.  Allergies  Allergen Reactions  . Ace Inhibitors Cough  . Amlodipine Swelling    UNSPECIFIED EDEMA   . Codeine Rash   Follow-up Information    [name], MD. Schedule an appointment as soon as possible for a visit in 2 week(s).   Specialty:  Family Medicine Contact information: [address] [place] [address] [phone]            The results of significant diagnostics from this hospitalization (including imaging, microbiology, ancillary and laboratory) are listed below for reference.    Significant Diagnostic Studies: Ir Fluoro Guide Cv Line Right  Result Date: 01/16/2017 CLINICAL DATA:  End-stage renal disease with current non tunneled temporary hemodialysis catheter. A tunneled hemodialysis catheter is now needed for longer-term use. EXAM: TUNNELED CENTRAL VENOUS HEMODIALYSIS CATHETER PLACEMENT WITH ULTRASOUND AND FLUOROSCOPIC GUIDANCE ANESTHESIA/SEDATION: 2.0 mg IV Versed; 75 mcg IV Fentanyl. Total Moderate Sedation Time:  24  minutes. The patient's level of consciousness and physiologic status were continuously monitored during the procedure by  Radiology nursing. MEDICATIONS: 2 g IV Ancef. As antibiotic prophylaxis, Ancef was ordered pre-procedure and administered intravenously within one hour of incision. FLUOROSCOPY TIME:  1 minutes and 12 seconds.  4.0 mGy. PROCEDURE: The procedure, risks, benefits, and alternatives were explained to the patient. Questions regarding the procedure were encouraged and answered. The patient understands and consents to the procedure. A timeout was performed prior to initiating the procedure. The right neck and chest were prepped with chlorhexidine in a sterile fashion, and a sterile drape was applied covering the operative field. Maximum barrier sterile technique with sterile gowns and gloves were used for the procedure. Local anesthesia was provided with 1% lidocaine. After creating a small venotomy incision, a 21 gauge needle was advanced into the right internal jugular vein under direct, real-time ultrasound guidance. Ultrasound image documentation was performed. After securing guidewire access, an 8 Fr dilator was placed. A J-wire was kinked to measure appropriate catheter length. A Palindrome tunneled hemodialysis catheter measuring 23 cm from tip to cuff was chosen for placement. This was tunneled in a retrograde fashion from the chest wall to the venotomy incision. At the venotomy, serial dilatation was performed and a 16 Fr peel-away sheath was placed over a guidewire. The catheter was then placed through the sheath and the sheath removed. Final catheter positioning was confirmed and documented with a fluoroscopic spot image. The catheter was aspirated, flushed with saline, and injected with appropriate volume heparin dwells. The venotomy incision was closed with subcutaneous subcuticular 4-0 Vicryl. Dermabond was applied to the incision. The catheter exit site was secured with 0-Prolene retention sutures. COMPLICATIONS: None.  No pneumothorax. FINDINGS: After catheter placement, the tip lies in the right atrium.  The catheter aspirates normally and is ready for immediate use. IMPRESSION: Placement of tunneled hemodialysis catheter via the right internal jugular vein. The catheter tip lies in the right atrium. The catheter is ready for immediate use. Electronically Signed   By: Aletta Edouard M.D.   On: 01/16/2017 15:41   Ir Fluoro Guide Cv Line Right  Result Date: 01/07/2017 INDICATION: End-stage renal disease EXAM: TEMPORARY RIGHT JUGULAR DIALYSIS CATHETER PLACEMENT MEDICATIONS: None ANESTHESIA/SEDATION: None FLUOROSCOPY TIME:  Fluoroscopy Time: 1 minutes  seconds ( mGy). COMPLICATIONS: None immediate. PROCEDURE: Informed written consent was obtained from the patient after a thorough discussion of the procedural risks, benefits and alternatives. All questions were addressed. Maximal Sterile Barrier Technique was utilized including caps, mask, sterile gowns, sterile gloves, sterile drape,  hand hygiene and skin antiseptic. A timeout was performed prior to the initiation of the procedure. The right neck was prepped and draped in a sterile fashion. 1% lidocaine was utilized for local anesthesia. Under sonographic guidance, a micropuncture needle was inserted into the right jugular vein which was removed over a 018 wire which was up sized to an Amplatz. The dialysis catheter dilator followed by the temporary dialysis catheter were advanced over the Amplatz wire. It was flushed and sewn in place. Heparin was instilled. FINDINGS: The tip of the right jugular temporary dialysis catheter is at the cavoatrial junction. IMPRESSION: Successful right jugular temporary dialysis catheter placement with its tip at the cavoatrial junction. Electronically Signed   By: Marybelle Killings M.D.   On: 01/07/2017 11:55   Ir US Guide Vasc Access Right  Result Date: 01/16/2017 CLINICAL DATA:  End-stage renal disease with current non tunneled temporary hemodialysis catheter. A tunneled hemodialysis catheter is now needed for longer-term use.  EXAM: TUNNELED CENTRAL VENOUS HEMODIALYSIS CATHETER PLACEMENT WITH ULTRASOUND AND FLUOROSCOPIC GUIDANCE ANESTHESIA/SEDATION: 2.0 mg IV Versed; 75 mcg IV Fentanyl. Total Moderate Sedation Time:  24  minutes. The patient's level of consciousness and physiologic status were continuously monitored during the procedure by Radiology nursing. MEDICATIONS: 2 g IV Ancef. As antibiotic prophylaxis, Ancef was ordered pre-procedure and administered intravenously within one hour of incision. FLUOROSCOPY TIME:  1 minutes and 12 seconds.  4.0 mGy. PROCEDURE: The procedure, risks, benefits, and alternatives were explained to the patient. Questions regarding the procedure were encouraged and answered. The patient understands and consents to the procedure. A timeout was performed prior to initiating the procedure. The right neck and chest were prepped with chlorhexidine in a sterile fashion, and a sterile drape was applied covering the operative field. Maximum barrier sterile technique with sterile gowns and gloves were used for the procedure. Local anesthesia was provided with 1% lidocaine. After creating a small venotomy incision, a 21 gauge needle was advanced into the right internal jugular vein under direct, real-time ultrasound guidance. Ultrasound image documentation was performed. After securing guidewire access, an 8 Fr dilator was placed. A J-wire was kinked to measure appropriate catheter length. A Palindrome tunneled hemodialysis catheter measuring 23 cm from tip to cuff was chosen for placement. This was tunneled in a retrograde fashion from the chest wall to the venotomy incision. At the venotomy, serial dilatation was performed and a 16 Fr peel-away sheath was placed over a guidewire. The catheter was then placed through the sheath and the sheath removed. Final catheter positioning was confirmed and documented with a fluoroscopic spot image. The catheter was aspirated, flushed with saline, and injected with appropriate  volume heparin dwells. The venotomy incision was closed with subcutaneous subcuticular 4-0 Vicryl. Dermabond was applied to the incision. The catheter exit site was secured with 0-Prolene retention sutures. COMPLICATIONS: None.  No pneumothorax. FINDINGS: After catheter placement, the tip lies in the right atrium. The catheter aspirates normally and is ready for immediate use. IMPRESSION: Placement of tunneled hemodialysis catheter via the right internal jugular vein. The catheter tip lies in the right atrium. The catheter is ready for immediate use. Electronically Signed   By: Aletta Edouard M.D.   On: 01/16/2017 15:41   Ir US Guide Vasc Access Right  Result Date: 01/07/2017 INDICATION: End-stage renal disease EXAM: TEMPORARY RIGHT JUGULAR DIALYSIS CATHETER PLACEMENT MEDICATIONS: None ANESTHESIA/SEDATION: None FLUOROSCOPY TIME:  Fluoroscopy Time: 1 minutes  seconds ( mGy). COMPLICATIONS: None immediate. PROCEDURE: Informed written consent was obtained  from the patient after a thorough discussion of the procedural risks, benefits and alternatives. All questions were addressed. Maximal Sterile Barrier Technique was utilized including caps, mask, sterile gowns, sterile gloves, sterile drape, hand hygiene and skin antiseptic. A timeout was performed prior to the initiation of the procedure. The right neck was prepped and draped in a sterile fashion. 1% lidocaine was utilized for local anesthesia. Under sonographic guidance, a micropuncture needle was inserted into the right jugular vein which was removed over a 018 wire which was up sized to an Amplatz. The dialysis catheter dilator followed by the temporary dialysis catheter were advanced over the Amplatz wire. It was flushed and sewn in place. Heparin was instilled. FINDINGS: The tip of the right jugular temporary dialysis catheter is at the cavoatrial junction. IMPRESSION: Successful right jugular temporary dialysis catheter placement with its tip at the  cavoatrial junction. Electronically Signed   By: Marybelle Killings M.D.   On: 01/07/2017 11:55   Dg Chest Port 1 View  Result Date: 01/29/2017 CLINICAL DATA:  Increasing weakness after dialysis this afternoon. EXAM: PORTABLE CHEST 1 VIEW COMPARISON:  12/26/2016 FINDINGS: Interval placement of a right central venous catheter with tip over the cavoatrial junction region. No pneumothorax. Cardiac enlargement with pulmonary vascular congestion. Bilateral pleural effusions with basilar atelectasis. No significant change since previous study. IMPRESSION: Cardiac enlargement, pulmonary vascular congestion, bilateral pleural effusions, and basilar atelectasis similar to previous study. Electronically Signed   By: Lucienne Capers M.D.   On: 01/29/2017 01:09    Microbiology: Recent Results (from the past 240 hour(s))  MRSA PCR Screening     Status: None   Collection Time: 01/31/17  1:32 PM  Result Value Ref Range Status   MRSA by PCR NEGATIVE NEGATIVE Final    Comment:        The GeneXpert MRSA Assay (FDA approved for NASAL specimens only), is one component of a comprehensive MRSA colonization surveillance program. It is not intended to diagnose MRSA infection nor to guide or monitor treatment for MRSA infections.      Labs: Basic Metabolic Panel: Recent Labs  Lab 01/29/17 0017 01/30/17 1043 01/31/17 0529 02/01/17 0457 02/02/17 0500  NA 133* 132* 133* 135 133*  K 4.6 5.8* 5.1 4.0 5.1  CL 91* 91* 92* 96* 93*  CO2 25 25 27 26 28   GLUCOSE 390* 105* 48* 100* 269*  BUN 46* 57* 67* 38* 52*  CREATININE 5.51* 6.57* 7.36* 4.77* 5.81*  CALCIUM 9.3 8.8* 9.2 8.6* 9.1  MG 2.1  --   --   --   --   PHOS  --   --   --  5.0*  --    Liver Function Tests: Recent Labs  Lab 01/31/17 0529 02/01/17 0457 02/02/17 0500  AST 71* 68* 40  ALT 36 40 33  ALKPHOS 194* 216* 207*  BILITOT 0.3 0.6 0.5  PROT 6.9 6.9 7.1  ALBUMIN 2.8* 2.7* 2.8*   No results for input(s): LIPASE, AMYLASE in the last 168  hours. No results for input(s): AMMONIA in the last 168 hours. CBC: Recent Labs  Lab 01/29/17 0017 01/31/17 0529 02/01/17 0457 02/02/17 0500  WBC 6.1 5.5 4.7 4.5  NEUTROABS 4.7  --   --   --   HGB 10.7* 10.2* 10.0* 9.9*  HCT 37.2 34.1* 34.3* 34.1*  MCV 99.2 96.3 96.9 98.0  PLT 214 215 199 201   Cardiac Enzymes: Recent Labs  Lab 01/29/17 0723 01/29/17 1214  TROPONINI 0.10* 0.11*  BNP: BNP (last 3 results) Recent Labs    10/31/16 0528 12/04/16 1820 12/23/16 1855  BNP 648.0* 2,222.9* 2,761.4*    ProBNP (last 3 results) No results for input(s): PROBNP in the last 8760 hours.  CBG: Recent Labs  Lab 02/01/17 2120 02/02/17 0338 02/02/17 0724 02/02/17 1111 02/02/17 1623  GLUCAP 262* 259* 258* 310* 164*       Signed:  Lelon Frohlich  [facility] Pager: [phone] 02/02/2017, 5:34 PM

## 2017-02-02 NOTE — Clinical Social Work Note (Signed)
Patient has made the decision to go home with HHPT.   LCSW signing off.     Kashawna Manzer D, LCSW  

## 2017-02-02 NOTE — Progress Notes (Signed)
[place] for Coumadin (home med) Indication: atrial fibrillation  Allergies  Allergen Reactions  . Ace Inhibitors Cough  . Amlodipine Swelling    UNSPECIFIED EDEMA   . Codeine Rash   Patient Measurements: Height: 5\' 8"  (172.7 cm) Weight: 202 lb 9.6 oz (91.9 kg) IBW/kg (Calculated) : 63.9  Vital Signs: Temp: 97.2 F (36.2 C) (11/29 0530) Temp Source: Oral (11/29 0530) BP: 108/49 (11/29 0530) Pulse Rate: 58 (11/29 0530)  Labs: Recent Labs    01/31/17 0529 02/01/17 0457 02/01/17 0655 02/02/17 0500  HGB 10.2* 10.0*  --  9.9*  HCT 34.1* 34.3*  --  34.1*  PLT 215 199  --  201  LABPROT 50.6*  --  35.9* 26.9*  INR 5.64*  --  3.63 2.51  CREATININE 7.36* 4.77*  --  5.81*   Estimated Creatinine Clearance: 10.4 mL/min (A) (by C-G formula based on SCr of 5.81 mg/dL (H)).  Medical History: Past Medical History:  Diagnosis Date  . Acute renal failure superimposed on stage 4 chronic kidney disease ([place]) 07/04/2015  . Allergic rhinitis   . Anemia   . Anxiety   . CAD in native artery    NSTEMI 07/2015 s/p DES to RCA and posterior PDA, PCI 10/2015 with scoring balloon to 85% ISR of distal RCA), known LAD/Cx disease treated medically  . Carotid artery disease ([place])    Mild bilateral carotid disease (1-39% 07/2015)  . Chronic combined systolic and diastolic CHF (congestive heart failure) ([place])   . CKD (chronic kidney disease), stage IV ([place])   . Constipation   . Essential hypertension   . GERD (gastroesophageal reflux disease)   . History of hysterectomy   . Hyperlipidemia   . Low back pain   . Obesity   . 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 Osteoarthritis   . Overactive bladder   . PVC's (premature ventricular contractions)   . Type 2 diabetes mellitus ([facility])    Medications:  Medications Prior to Admission  Medication Sig Dispense Refill Last Dose  .  albuterol (PROVENTIL HFA;VENTOLIN HFA) 108 (90 Base) MCG/ACT inhaler Inhale 1-2 puffs into the lungs every 6 (six) hours as needed for wheezing or shortness of breath. 1 Inhaler 0 unknnown at prn  . albuterol (PROVENTIL) (2.5 MG/3ML) 0.083% nebulizer solution Take 3 mLs (2.5 mg total) by nebulization every 6 (six) hours as needed for wheezing or shortness of breath. 75 mL 1 unknown at prn  . ALPRAZolam (XANAX) 0.5 MG tablet Take 0.5 mg by mouth at bedtime.   12/22/2016  . amiodarone (PACERONE) 200 MG tablet Take 1 tablet (200 mg total) daily by mouth. 30 tablet 0   . calcitRIOL (ROCALTROL) 0.5 MCG capsule Take 1 capsule (0.5 mcg total) daily by mouth. 30 capsule 0   . cetirizine (ZYRTEC) 10 MG tablet Take 5 mg by mouth daily.  3 12/23/2016  . clopidogrel (PLAVIX) 75 MG tablet Take 1 tablet (75 mg total) by mouth daily. 30 tablet 0 12/23/2016  . cyclobenzaprine (FLEXERIL) 5 MG tablet Take 5 mg by mouth as needed (lower back pain). osteoporosis with out current pathological fracture for 14 days  0 unknown at prn  . ferrous sulfate 325 (65 FE) MG tablet Take 325 mg by mouth daily with breakfast.    12/23/2016  . fluticasone (FLONASE) 50 MCG/ACT nasal spray Place 1 spray into both nostrils daily.   12/23/2016  .  insulin glargine (LANTUS) 100 UNIT/ML injection Inject 0.15 mLs (15 Units total) at bedtime into the skin. 10 mL 11   . isosorbide mononitrate (IMDUR) 30 MG 24 hr tablet Take 1 tablet (30 mg total) 2 (two) times daily by mouth. 60 tablet 0   . latanoprost (XALATAN) 0.005 % ophthalmic solution Place 1 drop into both eyes at bedtime.    12/22/2016  . meclizine (ANTIVERT) 25 MG tablet Take 12.5 mg by mouth 2 (two) times daily.    12/23/2016  . metoprolol succinate (TOPROL-XL) 25 MG 24 hr tablet Take 1 tablet (25 mg total) at bedtime by mouth. 30 tablet 0   . nitroGLYCERIN (NITROSTAT) 0.4 MG SL tablet Place 1 tablet (0.4 mg total) under the tongue every 5 (five) minutes as needed for chest pain. 25  tablet 2 unknown at prn  . pantoprazole (PROTONIX) 40 MG tablet Take 40 mg by mouth daily.    12/23/2016  . rosuvastatin (CRESTOR) 40 MG tablet Take 1 tablet (40 mg total) by mouth at bedtime. 30 tablet 0 12/22/2016  . diazepam (VALIUM) 5 MG tablet Take 1 tablet (5 mg total) by mouth every 12 (twelve) hours as needed for muscle spasms. (Patient not taking: Reported on 01/29/2017) 10 tablet 0 Not Taking at Unknown time  . warfarin (COUMADIN) 7.5 MG tablet Take 1 tablet (7.5 mg total) daily at 6 PM by mouth. (Patient not taking: Reported on 01/29/2017) 30 tablet 0 Not Taking at Unknown time    Assessment: 71yo female on chronic Coumadin for h/o afib.  INR remains trended down today to 2.51  Goal of Therapy:  INR 2-3 Monitor platelets by anticoagulation protocol: Yes   Plan:  Coumadin 5mg  po today Check INR daily Monitor for bleeding complications  [name] 02/02/2017,8:39 AM

## 2017-02-02 NOTE — Progress Notes (Signed)
Inpatient Diabetes Program Recommendations  AACE/ADA: New Consensus Statement on Inpatient Glycemic Control (2015)  Target Ranges:  Prepandial:   less than 140 mg/dL      Peak postprandial:   less than 180 mg/dL (1-2 hours)      Critically ill patients:  140 - 180 mg/dL  Results for [name], [name] (MRN [id number]) as of 02/02/2017 11:16  Ref. Range 02/02/2017 03:38 02/02/2017 07:24 02/02/2017 11:11  Glucose-Capillary Latest Ref Range: 65 - 99 mg/dL 259 (H) 258 (H) 310 (H)   Results for [name], [name] (MRN [id number]) as of 02/02/2017 11:16  Ref. Range 02/01/2017 08:15 02/01/2017 12:12 02/01/2017 14:04 02/01/2017 17:02 02/01/2017 21:20  Glucose-Capillary Latest Ref Range: 65 - 99 mg/dL 79 139 (H) 157 (H) 221 (H) 262 (H)   Review of Glycemic Control  Diabetes history: DM2 Outpatient Diabetes medications: Lantus 15 units QHS Current orders for Inpatient glycemic control: CBG monitoring  Inpatient Diabetes Program Recommendations: Insulin - Basal: Noted patient had been hypoglycemic and all insulin was discontinued, Glucose has ranged 221-310 mg/dl over the past 12 hours. May want to consider ordering very low dose basal insulin. If so, recommend ordering Lantus 5 units Q24H. Correction (SSI): May want to consider ordering custom Novolog correction scale for hyperglycemia.   Thanks, Barnie Alderman, RN, MSN, CDE Diabetes Coordinator Inpatient Diabetes Program [phone] (Team Pager from 8am to 5pm)

## 2017-02-02 NOTE — Progress Notes (Signed)
Patient received dialysis today. No complications, goal met, pt tolerated well.

## 2017-02-02 NOTE — Care Management Note (Signed)
Case Management Note  Patient Details  Name: [name] MRN: [id number] Date of Birth: 18-Sep-1945  If discussed at Long Length of Stay Meetings, dates discussed:  02/02/2017 Additional Comments:  Jini Horiuchi, Chauncey [place], RN 02/02/2017, 2:22 PM

## 2017-02-02 NOTE — Care Management (Signed)
Discussed disposition with patient, she would like to go home and continue home health with [facility]. We discussed DME needs, she reports she has RW at home and [facility] ready to be picked up. We discuss potential DC today and she reports she will notify her daughter and ask her to pick up WC. CM Will notify Santiago Glad of [facility] when patient is discharged. Patient reports she will have dialysis here today. She goes to [facility] in [place] on a T-Th-S schedule. Daughter takes her to appointments and dialysis, she reports working with [facility] to discuss other transportation options to and from dialysis.

## 2017-02-02 NOTE — Care Management (Signed)
Santiago Glad of [employer] [facility] notified of patient discharging, she will obtain orders for resumption from Epic.

## 2017-02-02 NOTE — Progress Notes (Signed)
Discharge instructions read to pt daughter. Daughter verbalized understanding of instructions. Discharged to home with [facility]agh[facility]

## 2017-02-02 NOTE — Progress Notes (Signed)
Subjective: Interval History: has no complaint of difficulty in breathing.  Presently she complains of constipation and also becoming more sleepy.  She denies any nausea or vomiting..  Objective: Vital signs in last 24 hours: Temp:  [97.2 F (36.2 C)-98.2 F (36.8 C)] 97.2 F (36.2 C) (11/29 0530) Pulse Rate:  [58-98] 58 (11/29 0530) Resp:  [16] 16 (11/29 0530) BP: (108-114)/(42-55) 108/49 (11/29 0530) SpO2:  [96 %-100 %] 99 % (11/29 0530) Weight:  [91.9 kg (202 lb 9.6 oz)] 91.9 kg (202 lb 9.6 oz) (11/29 0628) Weight change: 3.9 kg (8 lb 9.6 oz)  Intake/Output from previous day: 11/28 0701 - 11/29 0700 In: 240 [P.O.:240] Out: 1 [Stool:1] Intake/Output this shift: No intake/output data recorded.  General appearance: alert, cooperative and no distress Resp: diminished breath sounds bilaterally Cardio: irregularly irregular rhythm Extremities: No edema  Lab Results: Recent Labs    02/01/17 0457 02/02/17 0500  WBC 4.7 4.5  HGB 10.0* 9.9*  HCT 34.3* 34.1*  PLT 199 201   BMET:  Recent Labs    02/01/17 0457 02/02/17 0500  NA 135 133*  K 4.0 5.1  CL 96* 93*  CO2 26 28  GLUCOSE 100* 269*  BUN 38* 52*  CREATININE 4.77* 5.81*  CALCIUM 8.6* 9.1   No results for input(s): PTH in the last 72 hours. Iron Studies: No results for input(s): IRON, TIBC, TRANSFERRIN, FERRITIN in the last 72 hours.  Studies/Results: No results found.  I have reviewed the patient's current medications.  Assessment/Plan: 1] end-stage renal disease: She is status post hemodialysis on Tuesday.  Her potassium is normal.  Patient has this moment denies any uremic signs and symptoms. 2] anemia: Her hemoglobin is stable and within our target goal.  Presently she is on Epogen 3] atrial fibrillation/bradycardia: Patient is being followed by cardiology  4] bone and mineral disorder: Her calcium and phosphorus is 0 range 5] hypertension: Her blood pressure is well controlled 6] history of diabetes:  Patient on insulin her blood sugar is slightly high today. 7] history of constipation: Patient received some MiraLAX with some response. Plan: Will DC p.o. iron 2] we will dialyze patient today 3] will remove 2 L if blood pressure tolerates it.   LOS: 4 days   Aracelie Addis S 02/02/2017,8:38 AM

## 2017-02-02 NOTE — Progress Notes (Signed)
Physical Therapy Treatment Patient Details Name: [name] MRN: [id number] DOB: August 27, 1945 Today's Date: 02/02/2017    History of Present Illness  [name] is a 71 y.o. female with medical history significant of allergic rhinitis, anemia, anxiety, depression, CAD, coronary artery disease, chronic combined systolic and diastolic CHF, chronic constipation, hypertension, GERD, hyperlipidemia, chronic back pain, osteoarthritis, type 2 diabetes, ESRD on hemodialysis, recently diagnosed with atrial fibrillation in September who is coming to the emergency department with complaints of worsening chronic generalized weakness.  Her metoprolol was apparently increased from 25-100 mg p.o. daily.  She is also taking amiodarone 200 mg p.o.  She denies chest pain, palpitations, diaphoresis, PND orthopnea, pitting edema of the lower extremities, she recently lost her husband.  His funeral was yesterday.    PT Comments    Patient demonstrates slightly increased tolerance for gait, able to walk to doorway and back to bedside, limited secondary to c/o fatigue and tolerated sitting up in chair after therapy.  Patient will benefit from continued physical therapy in hospital and recommended venue below to increase strength, balance, endurance for safe ADLs and gait.    Follow Up Recommendations  SNF;Supervision/Assistance - 24 hour     Equipment Recommendations  None recommended by PT    Recommendations for Other Services       Precautions / Restrictions Precautions Precautions: Fall Restrictions Weight Bearing Restrictions: No    Mobility  Bed Mobility Overal bed mobility: Needs Assistance Bed Mobility: Supine to Sit     Supine to sit: Min guard     General bed mobility comments: patient requested to have bed slightly raised  Transfers Overall transfer level: Needs assistance Equipment used: Rolling walker (2 wheeled) Transfers: Sit to/from [facility] Sit  to Stand: Min assist Stand pivot transfers: Min guard          Ambulation/Gait Ambulation/Gait assistance: Min assist Ambulation Distance (Feet): 15 Feet Assistive device: Rolling walker (2 wheeled) Gait Pattern/deviations: Decreased step length - right;Decreased step length - left;Decreased stride length Gait velocity: decreased Gait velocity interpretation: Below normal speed for age/gender General Gait Details: demonstrates slow labored cadence without loss of balance, made it to doorway and back to bedside, limited secondary to c/o fatigue   Stairs            Wheelchair Mobility    Modified Rankin (Stroke Patients Only)       Balance Overall balance assessment: Needs assistance Sitting-balance support: Feet supported;No upper extremity supported Sitting balance-[name]: Good     Standing balance support: Bilateral upper extremity supported;During functional activity Standing balance-[name]: Fair                              Cognition Arousal/Alertness: Awake/alert Behavior During Therapy: WFL for tasks assessed/performed Overall Cognitive Status: Within Functional Limits for tasks assessed                                        Exercises General Exercises - Lower Extremity Long Arc Quad: Seated;AROM;Strengthening;Both;15 reps Hip Flexion/Marching: AROM;Seated;Strengthening;Both;15 reps Toe Raises: Seated;AROM;Strengthening;Both;15 reps Heel Raises: Seated;AROM;Strengthening;Both;15 reps    General Comments        Pertinent Vitals/Pain Pain Assessment: No/denies pain    Home Living  Prior Function            PT Goals (current goals can now be found in the care plan section) Acute Rehab PT Goals Patient Stated Goal: Return home PT Goal Formulation: With patient Time For Goal Achievement: 02/13/17 Potential to Achieve Goals: Good Progress towards PT goals: Progressing toward  goals    Frequency    Min 3X/week      PT Plan Current plan remains appropriate    Co-evaluation              AM-PAC PT "6 Clicks" Daily Activity  Outcome Measure  Difficulty turning over in bed (including adjusting bedclothes, sheets and blankets)?: A Little Difficulty moving from lying on back to sitting on the side of the bed? : A Little Difficulty sitting down on and standing up from a chair with arms (e.g., wheelchair, bedside commode, etc,.)?: A Little Help needed moving to and from a bed to chair (including a wheelchair)?: A Little Help needed walking in hospital room?: A Lot Help needed climbing 3-5 steps with a railing? : A Lot 6 Click Score: 16    End of Session Equipment Utilized During Treatment: Gait belt Activity Tolerance: Patient tolerated treatment well;Patient limited by fatigue Patient left: in chair;with call bell/phone within reach Nurse Communication: Mobility status PT Visit Diagnosis: Unsteadiness on feet (R26.81);Other abnormalities of gait and mobility (R26.89);Muscle weakness (generalized) (M62.81)     Time: 3568-6168 PT Time Calculation (min) (ACUTE ONLY): 21 min  Charges:  $Therapeutic Activity: 8-22 mins                    G Codes:       11:42 AM, February 05, 2017 Lonell Grandchild, MPT Physical Therapist with [facility] [phone] office [phone] mobile phone

## 2017-02-03 ENCOUNTER — Telehealth: Payer: Self-pay | Admitting: Cardiovascular Disease

## 2017-02-03 NOTE — Telephone Encounter (Signed)
Lori with [facility] called stating that patient is being discharged from [facility] today.  Home Health wants to know if patient will Need coumdin check.   Please call [phone] Cecille Rubin)

## 2017-02-03 NOTE — Telephone Encounter (Signed)
Telephoned pt and spoke with her daughter. Instructed her to ensure that pt  takes 1/2 tablet daily of her Coumadin today, Saturday and Sunday. Telephoned Lori [facility] nurse and gave dose instructions and recheck orders for 02/06/17 and to call results on Monday to [facility] coumadin clinic.

## 2017-02-06 ENCOUNTER — Telehealth: Payer: Self-pay | Admitting: *Deleted

## 2017-02-06 LAB — POCT INR

## 2017-02-07 ENCOUNTER — Ambulatory Visit (INDEPENDENT_AMBULATORY_CARE_PROVIDER_SITE_OTHER): Payer: BLUE CROSS/BLUE SHIELD | Admitting: "Endocrinology

## 2017-02-07 ENCOUNTER — Ambulatory Visit (INDEPENDENT_AMBULATORY_CARE_PROVIDER_SITE_OTHER): Payer: BLUE CROSS/BLUE SHIELD | Admitting: *Deleted

## 2017-02-07 ENCOUNTER — Encounter: Payer: Self-pay | Admitting: "Endocrinology

## 2017-02-07 VITALS — BP 106/70 | Ht 68.0 in

## 2017-02-07 DIAGNOSIS — E782 Mixed hyperlipidemia: Secondary | ICD-10-CM | POA: Diagnosis not present

## 2017-02-07 DIAGNOSIS — Z5181 Encounter for therapeutic drug level monitoring: Secondary | ICD-10-CM

## 2017-02-07 DIAGNOSIS — E1122 Type 2 diabetes mellitus with diabetic chronic kidney disease: Secondary | ICD-10-CM

## 2017-02-07 DIAGNOSIS — I748 Embolism and thrombosis of other arteries: Secondary | ICD-10-CM

## 2017-02-07 DIAGNOSIS — N186 End stage renal disease: Secondary | ICD-10-CM

## 2017-02-07 DIAGNOSIS — I10 Essential (primary) hypertension: Secondary | ICD-10-CM | POA: Diagnosis not present

## 2017-02-07 DIAGNOSIS — I480 Paroxysmal atrial fibrillation: Secondary | ICD-10-CM

## 2017-02-07 NOTE — Telephone Encounter (Signed)
Received call from [facility] nurse with [facility].  Pt's INR is >8.0 today.  INR was 2.4 on 11/30.  Pt was instructed then to take 3.75mg  daily and recheck today.  Ordered for Cecille Rubin to do STAT venipuncture if she could get blood.  (pt hard stick).  Cecille Rubin called back and was unable to get blood.  Told her to have pt hold coumadin and recheck INR on Thursday 02/09/17.  Bleeding and fall precautions stressed to pt.  She was instructed to go to hospital if she develops any unusual bleeding.

## 2017-02-07 NOTE — Progress Notes (Signed)
Subjective:    Patient ID: [name], female    DOB: 1945-04-19,    Past Medical History:  Diagnosis Date  . Acute renal failure superimposed on stage 4 chronic kidney disease ([place]) 07/04/2015  . Allergic rhinitis   . Anemia   . Anxiety   . CAD in native artery    NSTEMI 07/2015 s/p DES to RCA and posterior PDA, PCI 10/2015 with scoring balloon to 85% ISR of distal RCA), known LAD/Cx disease treated medically  . Carotid artery disease ([place])    Mild bilateral carotid disease (1-39% 07/2015)  . Chronic combined systolic and diastolic CHF (congestive heart failure) ([place])   . CKD (chronic kidney disease), stage IV ([place])   . Constipation   . Essential hypertension   . GERD (gastroesophageal reflux disease)   . History of hysterectomy   . Hyperlipidemia   . Low back pain   . Obesity   . 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 Osteoarthritis   . Overactive bladder   . PVC's (premature ventricular contractions)   . Type 2 diabetes mellitus ([place])    Past Surgical History:  Procedure Laterality Date  . ABDOMINAL HYSTERECTOMY    . AV FISTULA PLACEMENT Left 12/30/2016   Procedure: insertion of left upper arm gortex GRAFT;  Surgeon: Angelia Mould, MD;  Location: [facility] OR;  Service: Vascular;  Laterality: Left;  . BACK SURGERY     multiple  . CARDIAC CATHETERIZATION  04/04/2006   Est EF of 60%  . CARDIAC CATHETERIZATION N/A 07/04/2015   Procedure: Left Heart Cath and Coronary Angiography;  Surgeon: Troy Sine, MD;  Location: [place] [facility];  Service: Cardiovascular;  Laterality: N/A;  . CARDIAC CATHETERIZATION N/A 07/04/2015   Procedure: Coronary Stent Intervention;  Surgeon: Troy Sine, MD;  Location: [place] [facility];  Service: Cardiovascular;  Laterality: N/A;  . CARDIAC CATHETERIZATION N/A 10/13/2015   Procedure: Left Heart Cath and Coronary Angiography;  Surgeon: Troy Sine, MD;   Location: [place] [facility];  Service: Cardiovascular;  Laterality: N/A;  . CARDIAC CATHETERIZATION N/A 12/02/2015   Procedure: Left Heart Cath and Coronary Angiography;  Surgeon: Peter M Martinique, MD;  Location: [place] [facility];  Service: Cardiovascular;  Laterality: N/A;  . CARPAL TUNNEL RELEASE    . CERVICAL BIOPSY     cervical lymph node biopsies  . COLONOSCOPY  May 2002   Dr. Irving Shows :Followup in 5 years, normal exam  . COLONOSCOPY  2008   Dr. Laural Golden: Very redundant colon with mild melanosis coli, splenic flexure polyp biopsy with acute complaint of benign colon polyp. Recommended ten-year followup  . CORONARY STENT PLACEMENT  04/11/2006   2 -- Taxus stents to the circumflex   . IR FLUORO GUIDE CV LINE RIGHT  01/07/2017  . IR FLUORO GUIDE CV LINE RIGHT  01/16/2017  . IR US GUIDE VASC ACCESS RIGHT  01/07/2017  . IR US GUIDE VASC ACCESS RIGHT  01/16/2017  . PERIPHERAL VASCULAR CATHETERIZATION Right 07/09/2015   Procedure: Upper Extremity Angiography;  Surgeon: Conrad Wyncote, MD;  Location: [place] [facility];  Service: Cardiovascular;  Laterality: Right;  . PERIPHERAL VASCULAR CATHETERIZATION Right 07/10/2015   Procedure: RIGHT SUBCLAVIAN ARTERY THROMBECTOMY;  Surgeon: Serafina Mitchell, MD;  Location: [facility] OR;  Service: Vascular;  Laterality: Right;  . Tendonitis     bilateral elbow  . TRIGGER FINGER RELEASE  Social History   Socioeconomic History  . Marital status: Widowed    Spouse name: None  . Number of children: 3  . Years of education: None  . Highest education level: None  Social Needs  . Financial resource strain: None  . Food insecurity - worry: None  . Food insecurity - inability: None  . Transportation needs - medical: None  . Transportation needs - non-medical: None  Occupational History  . None  Tobacco Use  . Smoking status: Never Smoker  . Smokeless tobacco: Never Used  Substance and Sexual Activity  . Alcohol use: No    Alcohol/week: 0.0 oz  . Drug use:  No  . Sexual activity: None  Other Topics Concern  . None  Social History Narrative  . None   Outpatient Encounter Medications as of 02/07/2017  Medication Sig  . Insulin Glargine (LANTUS) 100 UNIT/ML Solostar Pen Inject 30 Units into the skin daily at 10 pm.  . insulin lispro (HUMALOG) 100 UNIT/ML KiwkPen Inject 5-11 Units into the skin 3 (three) times daily before meals.  Marland Kitchen albuterol (PROVENTIL HFA;VENTOLIN HFA) 108 (90 Base) MCG/ACT inhaler Inhale 1-2 puffs into the lungs every 6 (six) hours as needed for wheezing or shortness of breath.  Marland Kitchen albuterol (PROVENTIL) (2.5 MG/3ML) 0.083% nebulizer solution Take 3 mLs (2.5 mg total) by nebulization every 6 (six) hours as needed for wheezing or shortness of breath.  . ALPRAZolam (XANAX) 0.5 MG tablet Take 0.5 mg by mouth at bedtime.  Marland Kitchen amiodarone (PACERONE) 200 MG tablet Take 1 tablet (200 mg total) by mouth daily.  . calcitRIOL (ROCALTROL) 0.5 MCG capsule Take 1 capsule (0.5 mcg total) daily by mouth.  . cetirizine (ZYRTEC) 10 MG tablet Take 5 mg by mouth daily.  . clopidogrel (PLAVIX) 75 MG tablet Take 1 tablet (75 mg total) by mouth daily.  . cyclobenzaprine (FLEXERIL) 5 MG tablet Take 5 mg by mouth as needed (lower back pain). osteoporosis with out current pathological fracture for 14 days  . ferrous sulfate 325 (65 FE) MG tablet Take 325 mg by mouth daily with breakfast.   . fluticasone (FLONASE) 50 MCG/ACT nasal spray Place 1 spray into both nostrils daily.  . isosorbide mononitrate (IMDUR) 30 MG 24 hr tablet Take 1 tablet (30 mg total) 2 (two) times daily by mouth.  . latanoprost (XALATAN) 0.005 % ophthalmic solution Place 1 drop into both eyes at bedtime.   . meclizine (ANTIVERT) 25 MG tablet Take 12.5 mg by mouth 2 (two) times daily.   . nitroGLYCERIN (NITROSTAT) 0.4 MG SL tablet Place 1 tablet (0.4 mg total) under the tongue every 5 (five) minutes as needed for chest pain.  . pantoprazole (PROTONIX) 40 MG tablet Take 40 mg by mouth  daily.   . rosuvastatin (CRESTOR) 40 MG tablet Take 1 tablet (40 mg total) by mouth at bedtime.  . [DISCONTINUED] diazepam (VALIUM) 5 MG tablet Take 1 tablet (5 mg total) by mouth every 12 (twelve) hours as needed for muscle spasms. (Patient not taking: Reported on 01/29/2017)  . [DISCONTINUED] warfarin (COUMADIN) 7.5 MG tablet Take 1 tablet (7.5 mg total) daily at 6 PM by mouth. (Patient not taking: Reported on 01/29/2017)   No facility-administered encounter medications on file as of 02/07/2017.    ALLERGIES: Allergies  Allergen Reactions  . Ace Inhibitors Cough  . Amlodipine Swelling    UNSPECIFIED EDEMA   . Codeine Rash   VACCINATION STATUS: Immunization History  Administered Date(s) Administered  . Influenza, High Dose  Seasonal PF 12/25/2016    Diabetes  She presents for her follow-up diabetic visit. She has type 2 diabetes mellitus. Her disease course has been worsening. Pertinent negatives for hypoglycemia include no confusion, headaches, pallor or seizures. Associated symptoms include polydipsia and polyuria. Pertinent negatives for diabetes include no chest pain, no fatigue and no polyphagia. There are no hypoglycemic complications. Symptoms are worsening. Diabetic complications include heart disease, nephropathy and retinopathy. Pertinent negatives for diabetic complications include no CVA. (She is now initiated on hemodialysis.) Risk factors for coronary artery disease include diabetes mellitus, dyslipidemia, sedentary lifestyle and hypertension. Current diabetic treatment includes insulin injections. She is compliant with treatment most of the time. Her weight is increasing steadily. She is following a generally unhealthy diet. Meal planning includes avoidance of concentrated sweets. She rarely participates in exercise. Her home blood glucose trend is fluctuating dramatically. Her overall blood glucose range is >200 mg/dl. (During her recent hospitalization at [facility]  she did have hypoglycemic episodes which causes her discharge without insulin. After discharge she continued to have hyperglycemia in the range of 300-400. The last 7 days she does have 7 readings averaging 391.) An ACE inhibitor/angiotensin II receptor blocker is contraindicated (allergic to ACEI). Eye exam is current.  Hypertension  This is a chronic problem. The current episode started more than 1 year ago. The problem has been gradually improving since onset. Pertinent negatives include no chest pain, headaches, palpitations or shortness of breath. Hypertensive end-organ damage includes retinopathy. There is no history of CVA.  Hyperlipidemia  This is a chronic problem. The current episode started more than 1 year ago. Pertinent negatives include no chest pain, myalgias or shortness of breath. Current antihyperlipidemic treatment includes statins.  Coronary Artery Disease  Symptoms include leg swelling. Pertinent negatives include no chest pain, palpitations or shortness of breath. Risk factors include hyperlipidemia and hypertension.     Review of Systems  Constitutional: Negative for fatigue and unexpected weight change.  HENT: Negative for trouble swallowing and voice change.   Eyes: Negative for visual disturbance.  Respiratory: Negative for cough, shortness of breath and wheezing.   Cardiovascular: Positive for leg swelling. Negative for chest pain and palpitations.  Gastrointestinal: Negative for diarrhea, nausea and vomiting.  Endocrine: Positive for polydipsia and polyuria. Negative for cold intolerance, heat intolerance and polyphagia.  Musculoskeletal: Positive for gait problem. Negative for arthralgias and myalgias.  Skin: Negative for color change, pallor, rash and wound.  Neurological: Negative for seizures and headaches.  Psychiatric/Behavioral: Negative for confusion and suicidal ideas.    Objective:    BP 106/70   Ht 5\' 8"  (1.727 m)   BMI 30.81 kg/m   Wt Readings  from Last 3 Encounters:  02/02/17 202 lb 9.6 oz (91.9 kg)  01/17/17 183 lb 10.3 oz (83.3 kg)  12/14/16 206 lb (93.4 kg)    Physical Exam  Constitutional: She is oriented to person, place, and time. She appears well-developed.  She is wheelchair-bound.  HENT:  Head: Normocephalic and atraumatic.  Eyes: EOM are normal.  Neck: Normal range of motion. Neck supple. No tracheal deviation present. No thyromegaly present.  Cardiovascular: Normal rate and regular rhythm.  Pulmonary/Chest: Effort normal and breath sounds normal.  Abdominal: Soft. Bowel sounds are normal. There is no tenderness. There is no guarding.  Musculoskeletal: She exhibits edema.  Neurological: She is alert and oriented to person, place, and time. She has normal reflexes. No cranial nerve deficit. Coordination normal.  Skin: Skin is warm and dry.  No rash noted. No erythema. No pallor.  Psychiatric: She has a normal mood and affect. Judgment normal.    Results for orders placed or performed during the hospital encounter of 01/28/17  MRSA PCR Screening  Result Value Ref Range   MRSA by PCR NEGATIVE NEGATIVE  Basic metabolic panel  Result Value Ref Range   Sodium 133 (L) 135 - 145 mmol/L   Potassium 4.6 3.5 - 5.1 mmol/L   Chloride 91 (L) 101 - 111 mmol/L   CO2 25 22 - 32 mmol/L   Glucose, Bld 390 (H) 65 - 99 mg/dL   BUN 46 (H) 6 - 20 mg/dL   Creatinine, Ser 5.51 (H) 0.44 - 1.00 mg/dL   Calcium 9.3 8.9 - 10.3 mg/dL   GFR calc non Af Amer 7 (L) >60 mL/min   GFR calc Af Amer 8 (L) >60 mL/min   Anion gap 17 (H) 5 - 15  CBC with Differential  Result Value Ref Range   WBC 6.1 4.0 - 10.5 K/uL   RBC 3.75 (L) 3.87 - 5.11 MIL/uL   Hemoglobin 10.7 (L) 12.0 - 15.0 g/dL   HCT 37.2 36.0 - 46.0 %   MCV 99.2 78.0 - 100.0 fL   MCH 28.5 26.0 - 34.0 pg   MCHC 28.8 (L) 30.0 - 36.0 g/dL   RDW 20.0 (H) 11.5 - 15.5 %   Platelets 214 150 - 400 K/uL   Neutrophils Relative % 77 %   Neutro Abs 4.7 1.7 - 7.7 K/uL   Lymphocytes  Relative 16 %   Lymphs Abs 1.0 0.7 - 4.0 K/uL   Monocytes Relative 7 %   Monocytes Absolute 0.4 0.1 - 1.0 K/uL   Eosinophils Relative 0 %   Eosinophils Absolute 0.0 0.0 - 0.7 K/uL   Basophils Relative 0 %   Basophils Absolute 0.0 0.0 - 0.1 K/uL  Magnesium  Result Value Ref Range   Magnesium 2.1 1.7 - 2.4 mg/dL  Protime-INR  Result Value Ref Range   Prothrombin Time 36.1 (H) 11.4 - 15.2 seconds   INR 3.65   Troponin I  Result Value Ref Range   Troponin I 0.10 (HH) <0.03 ng/mL  Troponin I  Result Value Ref Range   Troponin I 0.11 (HH) <0.03 ng/mL  Glucose, capillary  Result Value Ref Range   Glucose-Capillary 348 (H) 65 - 99 mg/dL  Glucose, capillary  Result Value Ref Range   Glucose-Capillary 334 (H) 65 - 99 mg/dL  Glucose, capillary  Result Value Ref Range   Glucose-Capillary 183 (H) 65 - 99 mg/dL  Protime-INR  Result Value Ref Range   Prothrombin Time 45.9 (H) 11.4 - 15.2 seconds   INR 4.97 (HH)   Glucose, capillary  Result Value Ref Range   Glucose-Capillary 127 (H) 65 - 99 mg/dL  TSH  Result Value Ref Range   TSH 0.894 0.350 - 4.500 uIU/mL  Glucose, capillary  Result Value Ref Range   Glucose-Capillary 27 (LL) 65 - 99 mg/dL   Comment 1 Document in Chart   Glucose, capillary  Result Value Ref Range   Glucose-Capillary 37 (LL) 65 - 99 mg/dL   Comment 1 Document in Chart   Glucose, capillary  Result Value Ref Range   Glucose-Capillary 46 (L) 65 - 99 mg/dL  Glucose, capillary  Result Value Ref Range   Glucose-Capillary 59 (L) 65 - 99 mg/dL  Basic metabolic panel  Result Value Ref Range   Sodium 132 (L) 135 - 145 mmol/L  Potassium 5.8 (H) 3.5 - 5.1 mmol/L   Chloride 91 (L) 101 - 111 mmol/L   CO2 25 22 - 32 mmol/L   Glucose, Bld 105 (H) 65 - 99 mg/dL   BUN 57 (H) 6 - 20 mg/dL   Creatinine, Ser 6.57 (H) 0.44 - 1.00 mg/dL   Calcium 8.8 (L) 8.9 - 10.3 mg/dL   GFR calc non Af Amer 6 (L) >60 mL/min   GFR calc Af Amer 7 (L) >60 mL/min   Anion gap 16 (H) 5 -  15  Glucose, capillary  Result Value Ref Range   Glucose-Capillary 89 65 - 99 mg/dL  Glucose, capillary  Result Value Ref Range   Glucose-Capillary 161 (H) 65 - 99 mg/dL  Glucose, capillary  Result Value Ref Range   Glucose-Capillary 170 (H) 65 - 99 mg/dL   Comment 1 Notify RN    Comment 2 Document in Chart   Protime-INR  Result Value Ref Range   Prothrombin Time 50.6 (H) 11.4 - 15.2 seconds   INR 5.64 (HH)   Comprehensive metabolic panel  Result Value Ref Range   Sodium 133 (L) 135 - 145 mmol/L   Potassium 5.1 3.5 - 5.1 mmol/L   Chloride 92 (L) 101 - 111 mmol/L   CO2 27 22 - 32 mmol/L   Glucose, Bld 48 (L) 65 - 99 mg/dL   BUN 67 (H) 6 - 20 mg/dL   Creatinine, Ser 7.36 (H) 0.44 - 1.00 mg/dL   Calcium 9.2 8.9 - 10.3 mg/dL   Total Protein 6.9 6.5 - 8.1 g/dL   Albumin 2.8 (L) 3.5 - 5.0 g/dL   AST 71 (H) 15 - 41 U/L   ALT 36 14 - 54 U/L   Alkaline Phosphatase 194 (H) 38 - 126 U/L   Total Bilirubin 0.3 0.3 - 1.2 mg/dL   GFR calc non Af Amer 5 (L) >60 mL/min   GFR calc Af Amer 6 (L) >60 mL/min   Anion gap 14 5 - 15  CBC  Result Value Ref Range   WBC 5.5 4.0 - 10.5 K/uL   RBC 3.54 (L) 3.87 - 5.11 MIL/uL   Hemoglobin 10.2 (L) 12.0 - 15.0 g/dL   HCT 34.1 (L) 36.0 - 46.0 %   MCV 96.3 78.0 - 100.0 fL   MCH 28.8 26.0 - 34.0 pg   MCHC 29.9 (L) 30.0 - 36.0 g/dL   RDW 19.9 (H) 11.5 - 15.5 %   Platelets 215 150 - 400 K/uL  Glucose, capillary  Result Value Ref Range   Glucose-Capillary 122 (H) 65 - 99 mg/dL  Glucose, capillary  Result Value Ref Range   Glucose-Capillary 36 (LL) 65 - 99 mg/dL   Comment 1 Document in Chart   Glucose, capillary  Result Value Ref Range   Glucose-Capillary 49 (L) 65 - 99 mg/dL  Glucose, capillary  Result Value Ref Range   Glucose-Capillary 97 65 - 99 mg/dL  Glucose, capillary  Result Value Ref Range   Glucose-Capillary 132 (H) 65 - 99 mg/dL  Comprehensive metabolic panel  Result Value Ref Range   Sodium 135 135 - 145 mmol/L   Potassium 4.0  3.5 - 5.1 mmol/L   Chloride 96 (L) 101 - 111 mmol/L   CO2 26 22 - 32 mmol/L   Glucose, Bld 100 (H) 65 - 99 mg/dL   BUN 38 (H) 6 - 20 mg/dL   Creatinine, Ser 4.77 (H) 0.44 - 1.00 mg/dL   Calcium 8.6 (L) 8.9 - 10.3  mg/dL   Total Protein 6.9 6.5 - 8.1 g/dL   Albumin 2.7 (L) 3.5 - 5.0 g/dL   AST 68 (H) 15 - 41 U/L   ALT 40 14 - 54 U/L   Alkaline Phosphatase 216 (H) 38 - 126 U/L   Total Bilirubin 0.6 0.3 - 1.2 mg/dL   GFR calc non Af Amer 8 (L) >60 mL/min   GFR calc Af Amer 10 (L) >60 mL/min   Anion gap 13 5 - 15  CBC  Result Value Ref Range   WBC 4.7 4.0 - 10.5 K/uL   RBC 3.54 (L) 3.87 - 5.11 MIL/uL   Hemoglobin 10.0 (L) 12.0 - 15.0 g/dL   HCT 34.3 (L) 36.0 - 46.0 %   MCV 96.9 78.0 - 100.0 fL   MCH 28.2 26.0 - 34.0 pg   MCHC 29.2 (L) 30.0 - 36.0 g/dL   RDW 19.9 (H) 11.5 - 15.5 %   Platelets 199 150 - 400 K/uL  Cortisol-am, blood  Result Value Ref Range   Cortisol - AM 19.3 6.7 - 22.6 ug/dL  Glucose, capillary  Result Value Ref Range   Glucose-Capillary 207 (H) 65 - 99 mg/dL   Comment 1 Notify RN    Comment 2 Document in Chart   Glucose, capillary  Result Value Ref Range   Glucose-Capillary 159 (H) 65 - 99 mg/dL   Comment 1 Notify RN    Comment 2 Document in Chart   Glucose, capillary  Result Value Ref Range   Glucose-Capillary 122 (H) 65 - 99 mg/dL   Comment 1 Notify RN    Comment 2 Document in Chart   Phosphorus  Result Value Ref Range   Phosphorus 5.0 (H) 2.5 - 4.6 mg/dL  Glucose, capillary  Result Value Ref Range   Glucose-Capillary 98 65 - 99 mg/dL  Protime-INR  Result Value Ref Range   Prothrombin Time 35.9 (H) 11.4 - 15.2 seconds   INR 3.63   Glucose, capillary  Result Value Ref Range   Glucose-Capillary 79 65 - 99 mg/dL  Glucose, capillary  Result Value Ref Range   Glucose-Capillary 139 (H) 65 - 99 mg/dL   Comment 1 Notify RN    Comment 2 Document in Chart   Glucose, capillary  Result Value Ref Range   Glucose-Capillary 157 (H) 65 - 99 mg/dL    Comment 1 Notify RN    Comment 2 Document in Chart   Protime-INR  Result Value Ref Range   Prothrombin Time 26.9 (H) 11.4 - 15.2 seconds   INR 2.51   Comprehensive metabolic panel  Result Value Ref Range   Sodium 133 (L) 135 - 145 mmol/L   Potassium 5.1 3.5 - 5.1 mmol/L   Chloride 93 (L) 101 - 111 mmol/L   CO2 28 22 - 32 mmol/L   Glucose, Bld 269 (H) 65 - 99 mg/dL   BUN 52 (H) 6 - 20 mg/dL   Creatinine, Ser 5.81 (H) 0.44 - 1.00 mg/dL   Calcium 9.1 8.9 - 10.3 mg/dL   Total Protein 7.1 6.5 - 8.1 g/dL   Albumin 2.8 (L) 3.5 - 5.0 g/dL   AST 40 15 - 41 U/L   ALT 33 14 - 54 U/L   Alkaline Phosphatase 207 (H) 38 - 126 U/L   Total Bilirubin 0.5 0.3 - 1.2 mg/dL   GFR calc non Af Amer 7 (L) >60 mL/min   GFR calc Af Amer 8 (L) >60 mL/min   Anion gap 12 5 -  15  CBC  Result Value Ref Range   WBC 4.5 4.0 - 10.5 K/uL   RBC 3.48 (L) 3.87 - 5.11 MIL/uL   Hemoglobin 9.9 (L) 12.0 - 15.0 g/dL   HCT 34.1 (L) 36.0 - 46.0 %   MCV 98.0 78.0 - 100.0 fL   MCH 28.4 26.0 - 34.0 pg   MCHC 29.0 (L) 30.0 - 36.0 g/dL   RDW 20.1 (H) 11.5 - 15.5 %   Platelets 201 150 - 400 K/uL  Glucose, capillary  Result Value Ref Range   Glucose-Capillary 221 (H) 65 - 99 mg/dL   Comment 1 Notify RN    Comment 2 Document in Chart   Glucose, capillary  Result Value Ref Range   Glucose-Capillary 262 (H) 65 - 99 mg/dL   Comment 1 Notify RN    Comment 2 Document in Chart   Glucose, capillary  Result Value Ref Range   Glucose-Capillary 259 (H) 65 - 99 mg/dL   Comment 1 Notify RN    Comment 2 Document in Chart   Glucose, capillary  Result Value Ref Range   Glucose-Capillary 258 (H) 65 - 99 mg/dL   Comment 1 Notify RN    Comment 2 Document in Chart   Glucose, capillary  Result Value Ref Range   Glucose-Capillary 310 (H) 65 - 99 mg/dL   Comment 1 Notify RN    Comment 2 Document in Chart   Glucose, capillary  Result Value Ref Range   Glucose-Capillary 164 (H) 65 - 99 mg/dL   Comment 1 Notify RN    Comment 2  Document in Chart   I-stat troponin, ED  Result Value Ref Range   Troponin i, poc 0.09 (HH) 0.00 - 0.08 ng/mL   Comment 3           Chemistry (most recent): Lab Results  Component Value Date   NA 133 (L) 02/02/2017   K 5.1 02/02/2017   CL 93 (L) 02/02/2017   CO2 28 02/02/2017   BUN 52 (H) 02/02/2017   CREATININE 5.81 (H) 02/02/2017   Diabetic Labs (most recent): Lab Results  Component Value Date   HGBA1C 8.8 (H) 10/31/2016   HGBA1C 9.6 09/28/2016   HGBA1C 10.3 (H) 07/09/2016     Assessment & Plan:   1. 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 - She was advised to stop all of her insulin when she was discharged from [facility]. - She is now on hemodialysis for end-stage renal disease. - This patient required large dose of insulin to treat her diabetes before. Her most recent A1c was 8.8%.  -Patient is advised to stick to a routine mealtimes to eat 3 meals  a day and avoid unnecessary snacks ( to snack only to correct hypoglycemia). Patient is advised to eliminate simple carbs  from her diet including cakes desserts ice cream soda (  diet and regular) , sweet tea , Candies,  chips and cookies, artificial sweeteners,   and "sugar-free" products .  -  This will help patient to have stable blood glucose profile and potentially lose weight. Patient is given detailed personalized glucose monitoring and insulin dosing instructions.  Patient is instructed to call back with extremes of blood glucose less than 70 or greater than 300. Patient to bring meter and  blood glucose logs during their next visit.   -  I urged her to resume strict monitoring of blood glucose 4 times a day-before meals and at bedtime and resume basal/bolus insulin to achieve control of diabetes to target. - #1 goal in managing her diabetes will be to avoid hypoglycemia. - Now that she  is on hemodialysis, I will lower her previous dose of insulin significantly. - I advised her to continue Lantus 30 units daily at bedtime, restart Humalog at 5-11 units  3 times a day before meals if blood glucose pre-meal is above 90 mg/dL.  - Adjustment for hypo-and hyperglycemia is given in written instructions for the patient. - She may require higher dose of insulin, with make the adjustments slowly.  -Patient is warned not to take insulin without proper monitoring. -She is encouraged to call clinic if she registers blood glucose below 70 or above 300 mg/dL 3 tests in- a- row. - She recently went  on wheelchair due to disequilibrium. Her brother, accompany her to clinic, offering some help. She may need further help at home.  -She does not have a safe oral treatment option for diabetes given advanced chronic kidney disease.  -Patient specific target for A1c, LDL, and triglycerides were discussed with patient.   2. Essential hypertension Pt is allergic to ACEI, controleld. I defer blood pressure medication adjustment to nephrology. 3. Hyperlipemia -continue Crestor.   Follow up plan: Return in about 1 week (around 02/14/2017) for follow up with meter and logs- no labs. - Time spent with the patient: 25 min, of which >50% was spent in reviewing her sugar logs , discussing her hypo- and hyper-glycemic episodes, reviewing her current and  previous labs and insulin doses and developing a plan to avoid hypo- and hyper-glycemia.   Glade Lloyd, MD Phone: [phone]  Fax: [phone]  -  This note was partially dictated with voice recognition software. Similar sounding words can be transcribed inadequately or may not  be corrected upon review.  02/07/2017, 10:05 AM

## 2017-02-09 ENCOUNTER — Other Ambulatory Visit: Payer: Self-pay | Admitting: *Deleted

## 2017-02-09 DIAGNOSIS — I4891 Unspecified atrial fibrillation: Secondary | ICD-10-CM

## 2017-02-09 DIAGNOSIS — Z5181 Encounter for therapeutic drug level monitoring: Secondary | ICD-10-CM

## 2017-02-10 ENCOUNTER — Other Ambulatory Visit (HOSPITAL_COMMUNITY): Payer: Self-pay | Admitting: Podiatry

## 2017-02-10 ENCOUNTER — Telehealth: Payer: Self-pay | Admitting: Cardiovascular Disease

## 2017-02-10 ENCOUNTER — Encounter (HOSPITAL_COMMUNITY): Payer: Self-pay | Admitting: Emergency Medicine

## 2017-02-10 ENCOUNTER — Emergency Department (HOSPITAL_COMMUNITY): Payer: BLUE CROSS/BLUE SHIELD

## 2017-02-10 ENCOUNTER — Inpatient Hospital Stay (HOSPITAL_COMMUNITY)
Admission: EM | Admit: 2017-02-10 | Discharge: 2017-02-22 | DRG: 291 | Disposition: A | Discharge status: Skilled Nursing Facility | Payer: BLUE CROSS/BLUE SHIELD | Attending: Internal Medicine | Admitting: Internal Medicine

## 2017-02-10 ENCOUNTER — Other Ambulatory Visit: Payer: Self-pay

## 2017-02-10 DIAGNOSIS — T45515A Adverse effect of anticoagulants, initial encounter: Secondary | ICD-10-CM | POA: Diagnosis present

## 2017-02-10 DIAGNOSIS — I132 Hypertensive heart and chronic kidney disease with heart failure and with stage 5 chronic kidney disease, or end stage renal disease: Secondary | ICD-10-CM | POA: Diagnosis present

## 2017-02-10 DIAGNOSIS — E1165 Type 2 diabetes mellitus with hyperglycemia: Secondary | ICD-10-CM | POA: Diagnosis present

## 2017-02-10 DIAGNOSIS — E1122 Type 2 diabetes mellitus with diabetic chronic kidney disease: Secondary | ICD-10-CM | POA: Diagnosis present

## 2017-02-10 DIAGNOSIS — R7401 Elevation of levels of liver transaminase levels: Secondary | ICD-10-CM

## 2017-02-10 DIAGNOSIS — D696 Thrombocytopenia, unspecified: Secondary | ICD-10-CM | POA: Diagnosis not present

## 2017-02-10 DIAGNOSIS — K219 Gastro-esophageal reflux disease without esophagitis: Secondary | ICD-10-CM | POA: Diagnosis present

## 2017-02-10 DIAGNOSIS — I739 Peripheral vascular disease, unspecified: Secondary | ICD-10-CM

## 2017-02-10 DIAGNOSIS — R791 Abnormal coagulation profile: Secondary | ICD-10-CM | POA: Diagnosis present

## 2017-02-10 DIAGNOSIS — E785 Hyperlipidemia, unspecified: Secondary | ICD-10-CM | POA: Diagnosis present

## 2017-02-10 DIAGNOSIS — E11649 Type 2 diabetes mellitus with hypoglycemia without coma: Secondary | ICD-10-CM | POA: Diagnosis present

## 2017-02-10 DIAGNOSIS — Z794 Long term (current) use of insulin: Secondary | ICD-10-CM

## 2017-02-10 DIAGNOSIS — F439 Reaction to severe stress, unspecified: Secondary | ICD-10-CM | POA: Diagnosis not present

## 2017-02-10 DIAGNOSIS — I2510 Atherosclerotic heart disease of native coronary artery without angina pectoris: Secondary | ICD-10-CM | POA: Diagnosis present

## 2017-02-10 DIAGNOSIS — L89153 Pressure ulcer of sacral region, stage 3: Secondary | ICD-10-CM | POA: Diagnosis not present

## 2017-02-10 DIAGNOSIS — I25119 Atherosclerotic heart disease of native coronary artery with unspecified angina pectoris: Secondary | ICD-10-CM

## 2017-02-10 DIAGNOSIS — J9601 Acute respiratory failure with hypoxia: Secondary | ICD-10-CM | POA: Diagnosis not present

## 2017-02-10 DIAGNOSIS — F329 Major depressive disorder, single episode, unspecified: Secondary | ICD-10-CM | POA: Diagnosis present

## 2017-02-10 DIAGNOSIS — R740 Nonspecific elevation of levels of transaminase and lactic acid dehydrogenase [LDH]: Secondary | ICD-10-CM | POA: Diagnosis not present

## 2017-02-10 DIAGNOSIS — Z8249 Family history of ischemic heart disease and other diseases of the circulatory system: Secondary | ICD-10-CM

## 2017-02-10 DIAGNOSIS — R531 Weakness: Secondary | ICD-10-CM

## 2017-02-10 DIAGNOSIS — J189 Pneumonia, unspecified organism: Secondary | ICD-10-CM | POA: Diagnosis not present

## 2017-02-10 DIAGNOSIS — Z955 Presence of coronary angioplasty implant and graft: Secondary | ICD-10-CM

## 2017-02-10 DIAGNOSIS — R7989 Other specified abnormal findings of blood chemistry: Secondary | ICD-10-CM

## 2017-02-10 DIAGNOSIS — R001 Bradycardia, unspecified: Secondary | ICD-10-CM | POA: Diagnosis present

## 2017-02-10 DIAGNOSIS — D649 Anemia, unspecified: Secondary | ICD-10-CM | POA: Diagnosis present

## 2017-02-10 DIAGNOSIS — I248 Other forms of acute ischemic heart disease: Secondary | ICD-10-CM | POA: Diagnosis not present

## 2017-02-10 DIAGNOSIS — Z7902 Long term (current) use of antithrombotics/antiplatelets: Secondary | ICD-10-CM

## 2017-02-10 DIAGNOSIS — I25118 Atherosclerotic heart disease of native coronary artery with other forms of angina pectoris: Secondary | ICD-10-CM

## 2017-02-10 DIAGNOSIS — E871 Hypo-osmolality and hyponatremia: Secondary | ICD-10-CM | POA: Diagnosis present

## 2017-02-10 DIAGNOSIS — M25562 Pain in left knee: Secondary | ICD-10-CM | POA: Diagnosis present

## 2017-02-10 DIAGNOSIS — I10 Essential (primary) hypertension: Secondary | ICD-10-CM | POA: Diagnosis present

## 2017-02-10 DIAGNOSIS — R739 Hyperglycemia, unspecified: Secondary | ICD-10-CM | POA: Diagnosis not present

## 2017-02-10 DIAGNOSIS — R627 Adult failure to thrive: Secondary | ICD-10-CM | POA: Diagnosis present

## 2017-02-10 DIAGNOSIS — I5042 Chronic combined systolic (congestive) and diastolic (congestive) heart failure: Secondary | ICD-10-CM | POA: Diagnosis present

## 2017-02-10 DIAGNOSIS — Z833 Family history of diabetes mellitus: Secondary | ICD-10-CM

## 2017-02-10 DIAGNOSIS — I4891 Unspecified atrial fibrillation: Secondary | ICD-10-CM

## 2017-02-10 DIAGNOSIS — N184 Chronic kidney disease, stage 4 (severe): Secondary | ICD-10-CM | POA: Diagnosis not present

## 2017-02-10 DIAGNOSIS — I255 Ischemic cardiomyopathy: Secondary | ICD-10-CM | POA: Diagnosis present

## 2017-02-10 DIAGNOSIS — M7030 Other bursitis of elbow, unspecified elbow: Secondary | ICD-10-CM | POA: Diagnosis not present

## 2017-02-10 DIAGNOSIS — L89152 Pressure ulcer of sacral region, stage 2: Secondary | ICD-10-CM | POA: Diagnosis present

## 2017-02-10 DIAGNOSIS — D631 Anemia in chronic kidney disease: Secondary | ICD-10-CM | POA: Diagnosis present

## 2017-02-10 DIAGNOSIS — I959 Hypotension, unspecified: Secondary | ICD-10-CM | POA: Diagnosis present

## 2017-02-10 DIAGNOSIS — N186 End stage renal disease: Secondary | ICD-10-CM

## 2017-02-10 DIAGNOSIS — L89159 Pressure ulcer of sacral region, unspecified stage: Secondary | ICD-10-CM | POA: Diagnosis present

## 2017-02-10 DIAGNOSIS — I252 Old myocardial infarction: Secondary | ICD-10-CM

## 2017-02-10 DIAGNOSIS — R0902 Hypoxemia: Secondary | ICD-10-CM

## 2017-02-10 DIAGNOSIS — R8290 Unspecified abnormal findings in urine: Secondary | ICD-10-CM | POA: Diagnosis not present

## 2017-02-10 DIAGNOSIS — I480 Paroxysmal atrial fibrillation: Secondary | ICD-10-CM | POA: Diagnosis present

## 2017-02-10 DIAGNOSIS — R748 Abnormal levels of other serum enzymes: Secondary | ICD-10-CM | POA: Diagnosis not present

## 2017-02-10 DIAGNOSIS — R778 Other specified abnormalities of plasma proteins: Secondary | ICD-10-CM | POA: Diagnosis present

## 2017-02-10 DIAGNOSIS — Z992 Dependence on renal dialysis: Secondary | ICD-10-CM

## 2017-02-10 DIAGNOSIS — M109 Gout, unspecified: Secondary | ICD-10-CM | POA: Diagnosis not present

## 2017-02-10 DIAGNOSIS — F419 Anxiety disorder, unspecified: Secondary | ICD-10-CM | POA: Diagnosis present

## 2017-02-10 DIAGNOSIS — E875 Hyperkalemia: Secondary | ICD-10-CM | POA: Diagnosis present

## 2017-02-10 DIAGNOSIS — R52 Pain, unspecified: Secondary | ICD-10-CM

## 2017-02-10 DIAGNOSIS — Z823 Family history of stroke: Secondary | ICD-10-CM

## 2017-02-10 DIAGNOSIS — M25521 Pain in right elbow: Secondary | ICD-10-CM | POA: Diagnosis present

## 2017-02-10 LAB — COMPREHENSIVE METABOLIC PANEL
ALBUMIN: 2.8 g/dL — AB (ref 3.5–5.0)
ALK PHOS: 227 U/L — AB (ref 38–126)
ALT: 60 U/L — AB (ref 14–54)
AST: 112 U/L — AB (ref 15–41)
Anion gap: 12 (ref 5–15)
BUN: 33 mg/dL — AB (ref 6–20)
CALCIUM: 8.9 mg/dL (ref 8.9–10.3)
CO2: 28 mmol/L (ref 22–32)
CREATININE: 4.06 mg/dL — AB (ref 0.44–1.00)
Chloride: 91 mmol/L — ABNORMAL LOW (ref 101–111)
GFR calc Af Amer: 12 mL/min — ABNORMAL LOW (ref >60)
GFR calc non Af Amer: 10 mL/min — ABNORMAL LOW (ref >60)
GLUCOSE: 401 mg/dL — AB (ref 65–99)
Potassium: 4.6 mmol/L (ref 3.5–5.1)
SODIUM: 131 mmol/L — AB (ref 135–145)
Total Bilirubin: 0.7 mg/dL (ref 0.3–1.2)
Total Protein: 7.2 g/dL (ref 6.5–8.1)

## 2017-02-10 LAB — CBC WITH DIFFERENTIAL/PLATELET
BASOS ABS: 0 10*3/uL (ref 0.0–0.1)
BASOS PCT: 0 %
EOS ABS: 0 10*3/uL (ref 0.0–0.7)
Eosinophils Relative: 0 %
HCT: 36.1 % (ref 36.0–46.0)
HEMOGLOBIN: 10.5 g/dL — AB (ref 12.0–15.0)
LYMPHS ABS: 0.5 10*3/uL — AB (ref 0.7–4.0)
Lymphocytes Relative: 7 %
MCH: 27.9 pg (ref 26.0–34.0)
MCHC: 29.1 g/dL — ABNORMAL LOW (ref 30.0–36.0)
MCV: 96 fL (ref 78.0–100.0)
Monocytes Absolute: 0.3 10*3/uL (ref 0.1–1.0)
Monocytes Relative: 5 %
NEUTROS PCT: 88 %
Neutro Abs: 6.3 10*3/uL (ref 1.7–7.7)
Platelets: 174 10*3/uL (ref 150–400)
RBC: 3.76 MIL/uL — AB (ref 3.87–5.11)
RDW: 20.8 % — ABNORMAL HIGH (ref 11.5–15.5)
WBC: 7.2 10*3/uL (ref 4.0–10.5)

## 2017-02-10 LAB — GLUCOSE, CAPILLARY: GLUCOSE-CAPILLARY: 233 mg/dL — AB (ref 65–99)

## 2017-02-10 LAB — TROPONIN I
TROPONIN I: 1.42 ng/mL — AB (ref <0.03)
Troponin I: 1.68 ng/mL (ref <0.03)

## 2017-02-10 LAB — CBG MONITORING, ED: Glucose-Capillary: 274 mg/dL — ABNORMAL HIGH (ref 65–99)

## 2017-02-10 LAB — PROTIME-INR: Prothrombin Time: >90 seconds — ABNORMAL HIGH (ref 11.4–15.2)

## 2017-02-10 LAB — POC OCCULT BLOOD, ED: Fecal Occult Bld: NEGATIVE

## 2017-02-10 MED ORDER — ENSURE ENLIVE PO LIQD: 237.0000 mL | Freq: Two times a day (BID) | ORAL | Status: DC

## 2017-02-10 MED ORDER — ACETAMINOPHEN 325 MG PO TABS
650.0000 mg | ORAL_TABLET | Freq: Four times a day (QID) | ORAL | Status: DC | PRN
  Administered 2017-02-12 – 2017-02-22 (×3): 650 mg via ORAL
  Filled 2017-02-10 (×4): qty 2

## 2017-02-10 MED ORDER — CLOPIDOGREL BISULFATE 75 MG PO TABS
75.0000 mg | ORAL_TABLET | Freq: Every day | ORAL | Status: DC
  Administered 2017-02-10 – 2017-02-22 (×11): 75 mg via ORAL
  Filled 2017-02-10 (×12): qty 1

## 2017-02-10 MED ORDER — PHYTONADIONE 5 MG PO TABS
5.0000 mg | ORAL_TABLET | Freq: Once | ORAL | Status: AC
  Administered 2017-02-10: 5 mg via ORAL
  Filled 2017-02-10: qty 1

## 2017-02-10 MED ORDER — VITAMIN K1 10 MG/ML IJ SOLN: 10.0000 mg | Freq: Once | INTRAMUSCULAR | Status: DC

## 2017-02-10 MED ORDER — AMIODARONE HCL 200 MG PO TABS
200.0000 mg | ORAL_TABLET | Freq: Every day | ORAL | Status: DC
  Administered 2017-02-10 – 2017-02-15 (×6): 200 mg via ORAL
  Filled 2017-02-10 (×7): qty 1

## 2017-02-10 MED ORDER — FLUTICASONE PROPIONATE 50 MCG/ACT NA SUSP
1.0000 | Freq: Every day | NASAL | Status: DC
  Administered 2017-02-10 – 2017-02-22 (×9): 1 via NASAL
  Filled 2017-02-10 (×4): qty 16

## 2017-02-10 MED ORDER — INSULIN GLARGINE 100 UNIT/ML ~~LOC~~ SOLN
SUBCUTANEOUS | Status: AC
  Filled 2017-02-10: qty 10

## 2017-02-10 MED ORDER — ALBUTEROL SULFATE (2.5 MG/3ML) 0.083% IN NEBU
2.5000 mg | INHALATION_SOLUTION | Freq: Four times a day (QID) | RESPIRATORY_TRACT | Status: DC | PRN
Start: 2017-02-10 — End: 2017-02-10

## 2017-02-10 MED ORDER — ACETAMINOPHEN 650 MG RE SUPP: 650.0000 mg | Freq: Four times a day (QID) | RECTAL | Status: DC | PRN

## 2017-02-10 MED ORDER — INSULIN GLARGINE 100 UNIT/ML ~~LOC~~ SOLN
30.0000 [IU] | Freq: Every day | SUBCUTANEOUS | Status: DC
  Administered 2017-02-10: 30 [IU] via SUBCUTANEOUS
  Filled 2017-02-10 (×3): qty 0.3

## 2017-02-10 MED ORDER — CALCITRIOL 0.25 MCG PO CAPS
ORAL_CAPSULE | ORAL | Status: AC
  Filled 2017-02-10: qty 2

## 2017-02-10 MED ORDER — FERROUS SULFATE 325 (65 FE) MG PO TABS
325.0000 mg | ORAL_TABLET | Freq: Every day | ORAL | Status: DC
  Administered 2017-02-11 – 2017-02-22 (×10): 325 mg via ORAL
  Filled 2017-02-10 (×10): qty 1

## 2017-02-10 MED ORDER — ALPRAZOLAM 0.5 MG PO TABS
0.5000 mg | ORAL_TABLET | Freq: Every day | ORAL | Status: DC
  Administered 2017-02-10 – 2017-02-21 (×12): 0.5 mg via ORAL
  Filled 2017-02-10 (×12): qty 1

## 2017-02-10 MED ORDER — SODIUM CHLORIDE 0.9% FLUSH
3.0000 mL | Freq: Two times a day (BID) | INTRAVENOUS | Status: DC
  Administered 2017-02-10 – 2017-02-22 (×24): 3 mL via INTRAVENOUS

## 2017-02-10 MED ORDER — INSULIN ASPART 100 UNIT/ML ~~LOC~~ SOLN
15.0000 [IU] | Freq: Once | SUBCUTANEOUS | Status: AC
  Administered 2017-02-10: 15 [IU] via INTRAVENOUS
  Filled 2017-02-10: qty 1

## 2017-02-10 MED ORDER — MECLIZINE HCL 12.5 MG PO TABS
12.5000 mg | ORAL_TABLET | Freq: Two times a day (BID) | ORAL | Status: DC
  Administered 2017-02-10 – 2017-02-22 (×22): 12.5 mg via ORAL
  Filled 2017-02-10 (×22): qty 1

## 2017-02-10 MED ORDER — LORATADINE 10 MG PO TABS
10.0000 mg | ORAL_TABLET | Freq: Every day | ORAL | Status: DC
  Administered 2017-02-14 – 2017-02-22 (×7): 10 mg via ORAL
  Filled 2017-02-10 (×9): qty 1

## 2017-02-10 MED ORDER — CYCLOBENZAPRINE HCL 10 MG PO TABS
5.0000 mg | ORAL_TABLET | Freq: Every day | ORAL | Status: DC | PRN
  Administered 2017-02-11: 5 mg via ORAL
  Filled 2017-02-10: qty 1

## 2017-02-10 MED ORDER — INSULIN LISPRO 100 UNIT/ML ~~LOC~~ SOLN
5.0000 [IU] | Freq: Three times a day (TID) | SUBCUTANEOUS | Status: DC
  Filled 2017-02-10: qty 10

## 2017-02-10 MED ORDER — INSULIN ASPART 100 UNIT/ML ~~LOC~~ SOLN
0.0000 [IU] | Freq: Three times a day (TID) | SUBCUTANEOUS | Status: DC
Start: 2017-02-11 — End: 2017-02-19
  Administered 2017-02-11: 3 [IU] via SUBCUTANEOUS
  Administered 2017-02-12 (×2): 2 [IU] via SUBCUTANEOUS
  Administered 2017-02-13 – 2017-02-14 (×4): 8 [IU] via SUBCUTANEOUS
  Administered 2017-02-15: 2 [IU] via SUBCUTANEOUS
  Administered 2017-02-18 (×2): 5 [IU] via SUBCUTANEOUS

## 2017-02-10 MED ORDER — VITAMINS A & D EX OINT
TOPICAL_OINTMENT | Freq: Four times a day (QID) | CUTANEOUS | Status: DC
Start: 2017-02-10 — End: 2017-02-22
  Administered 2017-02-11 (×2): 1 via TOPICAL
  Administered 2017-02-11: 5 via TOPICAL
  Administered 2017-02-11 – 2017-02-12 (×2): 1 via TOPICAL
  Administered 2017-02-12: 5 via TOPICAL
  Administered 2017-02-12 (×2): 1 via TOPICAL
  Administered 2017-02-13 – 2017-02-15 (×4): 5 via TOPICAL
  Administered 2017-02-15: 19:00:00 via TOPICAL
  Administered 2017-02-15: 1 via TOPICAL
  Administered 2017-02-15: 5 via TOPICAL
  Administered 2017-02-16: 09:00:00 via TOPICAL
  Administered 2017-02-16 – 2017-02-17 (×3): 5 via TOPICAL
  Administered 2017-02-17: 20:00:00 via TOPICAL
  Administered 2017-02-17: 5 via TOPICAL
  Administered 2017-02-18 (×2): via TOPICAL
  Administered 2017-02-19: 5 via TOPICAL
  Administered 2017-02-19 (×2): via TOPICAL
  Administered 2017-02-20 (×2): 5 via TOPICAL
  Administered 2017-02-20 – 2017-02-21 (×3): via TOPICAL
  Administered 2017-02-21: 5 via TOPICAL
  Administered 2017-02-21: 09:00:00 via TOPICAL
  Administered 2017-02-22 (×2): 5 via TOPICAL
  Filled 2017-02-10: qty 113

## 2017-02-10 MED ORDER — ISOSORBIDE MONONITRATE ER 60 MG PO TB24
30.0000 mg | ORAL_TABLET | Freq: Two times a day (BID) | ORAL | Status: DC
  Administered 2017-02-10 – 2017-02-22 (×19): 30 mg via ORAL
  Filled 2017-02-10 (×20): qty 1

## 2017-02-10 MED ORDER — LATANOPROST 0.005 % OP SOLN
OPHTHALMIC | Status: AC
  Filled 2017-02-10: qty 2.5

## 2017-02-10 MED ORDER — ALBUTEROL SULFATE (2.5 MG/3ML) 0.083% IN NEBU
2.5000 mg | INHALATION_SOLUTION | Freq: Four times a day (QID) | RESPIRATORY_TRACT | Status: DC | PRN
  Administered 2017-02-13 (×2): 2.5 mg via RESPIRATORY_TRACT
  Administered 2017-02-14: 5 mg via RESPIRATORY_TRACT
  Administered 2017-02-15 – 2017-02-21 (×2): 2.5 mg via RESPIRATORY_TRACT
  Filled 2017-02-10 (×5): qty 3

## 2017-02-10 MED ORDER — LATANOPROST 0.005 % OP SOLN
1.0000 [drp] | Freq: Every day | OPHTHALMIC | Status: DC
  Administered 2017-02-10 – 2017-02-20 (×11): 1 [drp] via OPHTHALMIC
  Filled 2017-02-10: qty 2.5

## 2017-02-10 MED ORDER — CALCITRIOL 0.25 MCG PO CAPS
0.5000 ug | ORAL_CAPSULE | Freq: Every day | ORAL | Status: DC
  Administered 2017-02-10 – 2017-02-22 (×11): 0.5 ug via ORAL
  Filled 2017-02-10 (×3): qty 2
  Filled 2017-02-10: qty 1
  Filled 2017-02-10 (×2): qty 2
  Filled 2017-02-10: qty 1
  Filled 2017-02-10: qty 2
  Filled 2017-02-10 (×4): qty 1
  Filled 2017-02-10: qty 2

## 2017-02-10 MED ORDER — ONDANSETRON HCL 4 MG/2ML IJ SOLN
4.0000 mg | Freq: Four times a day (QID) | INTRAMUSCULAR | Status: DC | PRN
  Administered 2017-02-19: 4 mg via INTRAVENOUS
  Filled 2017-02-10: qty 2

## 2017-02-10 MED ORDER — ONDANSETRON HCL 4 MG PO TABS: 4.0000 mg | ORAL_TABLET | Freq: Four times a day (QID) | ORAL | Status: DC | PRN

## 2017-02-10 MED ORDER — PANTOPRAZOLE SODIUM 40 MG PO TBEC
40.0000 mg | DELAYED_RELEASE_TABLET | Freq: Every day | ORAL | Status: DC
  Administered 2017-02-10 – 2017-02-22 (×11): 40 mg via ORAL
  Filled 2017-02-10 (×11): qty 1

## 2017-02-10 MED ORDER — ROSUVASTATIN CALCIUM 20 MG PO TABS
40.0000 mg | ORAL_TABLET | Freq: Every day | ORAL | Status: DC
  Administered 2017-02-10 – 2017-02-15 (×6): 40 mg via ORAL
  Filled 2017-02-10 (×2): qty 1
  Filled 2017-02-10 (×2): qty 2
  Filled 2017-02-10: qty 1
  Filled 2017-02-10 (×3): qty 2

## 2017-02-10 NOTE — Consult Note (Addendum)
CARDIOLOGY CONSULT NOTE    Patient ID: [name]; [id number]; October 19, 1945   Admit date: 02/10/2017 Date of Consult: 02/10/2017  Primary Care Provider: [name], MD Primary Cardiologist: Kate Sable, MD    Patient Profile:   [name] is a 71 y.o. 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  The patient was recently discharged from [facility] on 02/02/2017.  History of Present Illness:   Ms. Cornell presented to the emergency room with generalized fatigue and weakness.  This is been ongoing for the last several weeks.  Her health has been declining steadily over the last year.  She was seen in our Coumadin clinic in the [place] office and was found to have a prolonged INR of 8.  She was advised to stop taking Coumadin and follow-up for blood work within 3 days.  She went to [facility] 4 days ago to have blood drawn.  Apparently this was unsuccessful.  She went to her dialysis and asked them to draw blood and was told that they were unable to do so.  She states that she continue to hold Coumadin as directed.  Has a home health nurse that sees her and feels her medicine tray.  She has had the Coumadin taken out.  She went today to visit her foot doctor and was complaining of fatigue and listlessness.  He advised her to call the cardiology office for more information concerning her Coumadin therapy.  She was advised to come to the emergency room after  speaking with [facility] cardiology office.  On arrival to the emergency room he was found to be hypotensive with a blood pressure of 88/29, heart rate 41.  PT greater than 90.0, INR greater than 10.00.  He was not found to be profoundly anemic but hemoglobin was 10.5 with hematocrit of 36.1.  Sodium 131, creatinine 4.06, BUN 33.  Troponin I 0.68.  Chest x-ray revealed pneumonia in the right base, there is no evidence of CHF.  EKG revealed sinus bradycardia, intraventricular conduction delay with inferior Q waves noted.  The patient on discharge recently was told to hold metoprolol and was to continue amiodarone.  The patient complains of generalized fatigue and listlessness, she has had infrequent episodes of epistaxis, she also has been having some bleeding from a decubitus ulcer in her sacral area.  She has a wound on her left lower leg which is being treated by wound clinic, she is due for vascular studies this following week  Past Medical History:  Diagnosis Date  . Acute renal failure superimposed on stage 4 chronic kidney disease ([place]) 07/04/2015  . Allergic rhinitis   . Anemia   . Anxiety   . CAD in native artery    NSTEMI 07/2015 s/p DES to RCA and posterior PDA, PCI 10/2015 with scoring balloon to 85% ISR of distal RCA), known LAD/Cx disease treated medically  . Carotid artery disease ([place])    Mild bilateral carotid disease (1-39% 07/2015)  . Chronic combined systolic and diastolic CHF (  congestive heart failure) ([place])   . CKD (chronic kidney disease), stage IV ([place])   . Constipation   . Essential hypertension   . GERD (gastroesophageal reflux disease)   . History of hysterectomy   . Hyperlipidemia   . Low back pain   . Obesity   . 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 Osteoarthritis   . Overactive bladder   . PVC's (premature ventricular contractions)   . Type 2 diabetes mellitus ([place])     Past Surgical  History:  Procedure Laterality Date  . ABDOMINAL HYSTERECTOMY    . AV FISTULA PLACEMENT Left 12/30/2016   Procedure: insertion of left upper arm gortex GRAFT;  Surgeon: Angelia Mould, MD;  Location: [facility] OR;  Service: Vascular;  Laterality: Left;  . BACK SURGERY     multiple  . CARDIAC CATHETERIZATION  04/04/2006   Est EF of 60%  . CARDIAC CATHETERIZATION N/A 07/04/2015   Procedure: Left Heart Cath and Coronary Angiography;  Surgeon: Troy Sine, MD;  Location: [place] CV LAB;  Service: Cardiovascular;  Laterality: N/A;  . CARDIAC CATHETERIZATION N/A 07/04/2015   Procedure: Coronary Stent Intervention;  Surgeon: Troy Sine, MD;  Location: [place] [facility];  Service: Cardiovascular;  Laterality: N/A;  . CARDIAC CATHETERIZATION N/A 10/13/2015   Procedure: Left Heart Cath and Coronary Angiography;  Surgeon: Troy Sine, MD;  Location: [place] [facility];  Service: Cardiovascular;  Laterality: N/A;  . CARDIAC CATHETERIZATION N/A 12/02/2015   Procedure: Left Heart Cath and Coronary Angiography;  Surgeon: Peter M Martinique, MD;  Location: [place] [facility] LAB;  Service: Cardiovascular;  Laterality: N/A;  . CARPAL TUNNEL RELEASE    . CERVICAL BIOPSY     cervical lymph node biopsies  . COLONOSCOPY  May 2002   Dr. Irving Shows :Followup in 5 years, normal exam  . COLONOSCOPY  2008   Dr. Laural Golden: Very redundant colon with mild melanosis coli, splenic flexure polyp biopsy with acute complaint of benign colon polyp. Recommended ten-year followup  . CORONARY STENT PLACEMENT  04/11/2006   2 -- Taxus stents to the circumflex   . IR FLUORO GUIDE CV LINE RIGHT  01/07/2017  . IR FLUORO GUIDE CV LINE RIGHT  01/16/2017  . IR US GUIDE VASC ACCESS RIGHT  01/07/2017  . IR US GUIDE VASC ACCESS RIGHT  01/16/2017  . PERIPHERAL VASCULAR CATHETERIZATION Right 07/09/2015   Procedure: Upper Extremity Angiography;  Surgeon: Conrad Rural Hill, MD;  Location: [place] [facility] LAB;  Service: Cardiovascular;   Laterality: Right;  . PERIPHERAL VASCULAR CATHETERIZATION Right 07/10/2015   Procedure: RIGHT SUBCLAVIAN ARTERY THROMBECTOMY;  Surgeon: Serafina Mitchell, MD;  Location: [facility] OR;  Service: Vascular;  Laterality: Right;  . Tendonitis     bilateral elbow  . TRIGGER FINGER RELEASE       Home Medications:  Prior to Admission medications   Medication Sig Start Date End Date Taking? Authorizing Provider  albuterol (PROVENTIL HFA;VENTOLIN HFA) 108 (90 Base) MCG/ACT inhaler Inhale 1-2 puffs into the lungs every 6 (six) hours as needed for wheezing or shortness of breath. 04/01/16   Varney Biles, MD  albuterol (PROVENTIL) (2.5 MG/3ML) 0.083% nebulizer solution Take 3 mLs (2.5 mg total) by nebulization every 6 (six) hours as needed for wheezing or shortness of breath. 07/16/16   Elgergawy, Silver Huguenin, MD  ALPRAZolam Duanne Moron) 0.5 MG tablet Take 0.5 mg by mouth at bedtime.  [provider]  amiodarone (PACERONE) 200 MG tablet Take 1 tablet (200 mg total) by mouth daily. 02/02/17   Isaac Bliss, Rayford Halsted, MD  calcitRIOL (ROCALTROL) 0.5 MCG capsule Take 1 capsule (0.5 mcg total) daily by mouth. 01/18/17   Regalado, Belkys A, MD  cetirizine (ZYRTEC) 10 MG tablet Take 5 mg by mouth daily. 10/24/16   [provider]  clopidogrel (PLAVIX) 75 MG tablet Take 1 tablet (75 mg total) by mouth daily. 12/13/16   Georgette Shell, MD  cyclobenzaprine (FLEXERIL) 5 MG tablet Take 5 mg by mouth as needed (lower back pain). osteoporosis with out current pathological fracture for 14 days 10/03/16   [provider]  ferrous sulfate 325 (65 FE) MG tablet Take 325 mg by mouth daily with breakfast.     [provider]  fluticasone (FLONASE) 50 MCG/ACT nasal spray Place 1 spray into both nostrils daily.    [provider]  Insulin Glargine (LANTUS) 100 UNIT/ML Solostar Pen Inject 30 Units into the skin daily at 10 pm.    [provider]  insulin lispro (HUMALOG) 100 UNIT/ML  KiwkPen Inject 5-11 Units into the skin 3 (three) times daily before meals.    [provider]  isosorbide mononitrate (IMDUR) 30 MG 24 hr tablet Take 1 tablet (30 mg total) 2 (two) times daily by mouth. 01/17/17   Regalado, Belkys A, MD  latanoprost (XALATAN) 0.005 % ophthalmic solution Place 1 drop into both eyes at bedtime.     [provider]  meclizine (ANTIVERT) 25 MG tablet Take 12.5 mg by mouth 2 (two) times daily.     [provider]  nitroGLYCERIN (NITROSTAT) 0.4 MG SL tablet Place 1 tablet (0.4 mg total) under the tongue every 5 (five) minutes as needed for chest pain. 10/14/15   Lyda Jester M, PA-C  pantoprazole (PROTONIX) 40 MG tablet Take 40 mg by mouth daily.     [provider]  rosuvastatin (CRESTOR) 40 MG tablet Take 1 tablet (40 mg total) by mouth at bedtime. 07/12/15   Lily Kocher, MD    Inpatient Medications: Scheduled Meds:  Continuous Infusions: . phytonadione (VITAMIN K) IV     PRN Meds:   Allergies:    Allergies  Allergen Reactions  . Ace Inhibitors Cough  . Amlodipine Swelling    UNSPECIFIED EDEMA   . Codeine Rash    Social History:   Social History   Socioeconomic History  . Marital status: Widowed    Spouse name: Not on file  . Number of children: 3  . Years of education: Not on file  . Highest education level: Not on file  Social Needs  . Financial resource strain: Not on file  . Food insecurity - worry: Not on file  . Food insecurity - inability: Not on file  . Transportation needs - medical: Not on file  . Transportation needs - non-medical: Not on file  Occupational History  . Not on file  Tobacco Use  . Smoking status: Never Smoker  . Smokeless tobacco: Never Used  Substance and Sexual Activity  . Alcohol use: No    Alcohol/week: 0.0 oz  . Drug use: No  . Sexual activity: Not on file  Other Topics Concern  . Not on file  Social History Narrative  . Not on file    Family History:     Family History  Problem Relation Age of Onset  . Diabetes Father   . Hypertension Father   .  Stroke Father   . Hypertension Brother   . Aneurysm Brother   . Diabetes Brother   . Colon cancer Neg Hx      ROS:  Please see the history of present illness.  ROS  All other ROS reviewed and negative.     Physical Exam/Data:   Vitals:   02/10/17 1143 02/10/17 1144  BP: 108/72   Pulse: 66   Resp: 19   Temp: 98 F (36.7 C)   TempSrc: Oral   SpO2: 100%   Weight:  202 lb (91.6 kg)  Height:  5\' 8"  (1.727 m)   No intake or output data in the 24 hours ending 02/10/17 1509 Filed Weights   02/10/17 1144  Weight: 202 lb (91.6 kg)   Body mass index is 30.71 kg/m.  General:  Well nourished, well developed, in no acute distress, ill-appearing. HEENT: normal Lymph: no adenopathy Neck: no JVD Endocrine:  No thryomegaly Vascular: No carotid bruits; FA pulses 2+ bilaterally without bruits  Cardiac:  normal S1, S2, bradycardic 1/6 systolic murmur heard best at the left sternal  Lungs:  clear to auscultation bilaterally, no wheezing, rhonchi or rales  Abd: soft, nontender, no hepatomegaly  Ext: no edema Musculoskeletal:  No deformities, BUE and BLE strength normal and equal dressing to left lower leg, dialysis catheter in right upper chest and subclavian area.  Decubitus ulcer noted in the sacral area, skin friable, with open wound with no active bleeding.  Appears to be stage II Skin: warm and dry  Neuro:  CNs 2-12 intact, no focal abnormalities noted Psych:  Normal affect   EKG:  The EKG was personally reviewed and demonstrates: Sinus bradycardia with intraventricular conduction delay, inferior Q waves. Telemetry:  Telemetry was personally reviewed and demonstrates: Sinus bradycardia heart rate 62-65 bpm  Relevant CV Studies: Echocardiogram 11/11/2016 Left ventricle: The cavity size was normal. Wall thickness was   increased in a pattern of moderate LVH. Systolic function was    moderately to severely reduced. The estimated ejection fraction   was in the range of 30% to 35%. Features are consistent with a   pseudonormal left ventricular filling pattern, with concomitant   abnormal relaxation and increased filling pressure (grade 2   diastolic dysfunction). Doppler parameters are consistent with   high ventricular filling pressure. - Regional wall motion abnormality: Hypokinesis of the mid inferior   and basal-mid inferolateral myocardium. - Aortic valve: Mildly calcified annulus. Trileaflet; mildly   thickened leaflets. Valve area (VTI): 1.57 cm^2. Valve area   (Vmax): 1.57 cm^2. Valve area (Vmean): 1.51 cm^2. - Mitral valve: Mildly calcified annulus. Mildly thickened leaflets   . There was mild regurgitation. - Left atrium: The atrium was severely dilated. - Right atrium: The atrium was moderately dilated. - Pulmonary arteries: Systolic pressure was moderately increased.   PA peak pressure: 44 mm Hg (S).  Cardiac Cath 12/02/2015 Conclusion     Mid LAD lesion, 60 %stenosed.  Prox Cx to Mid Cx lesion, 99 %stenosed.  Mid RCA lesion, 0 %stenosed at site of prior stent  Dist RCA lesion, 30%stenosed at site of prior stent.  Prox RCA lesion, 40 %stenosed.  Ost Cx to Prox Cx lesion, 80 %stenosed.  LV end diastolic pressure is moderately elevated.   1. 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 Patent stents in the mid RCA and distal RCA/PDA 3. Moderate mid LAD disease- unchanged from prior studies. 4. Elevated LVEDP    Laboratory Data:  Chemistry  Recent Labs  Lab 02/10/17 1243  NA 131*  K 4.6  CL 91*  CO2 28  GLUCOSE 401*  BUN 33*  CREATININE 4.06*  CALCIUM 8.9  GFRNONAA 10*  GFRAA 12*  ANIONGAP 12    Recent Labs  Lab 02/10/17 1243  PROT 7.2  ALBUMIN 2.8*  AST 112*  ALT 60*  ALKPHOS 227*  BILITOT 0.7   Hematology Recent Labs  Lab 02/10/17 1243  WBC 7.2  RBC 3.76*  HGB 10.5*  HCT 36.1  MCV 96.0    MCH 27.9  MCHC 29.1*  RDW 20.8*  PLT 174   Cardiac Enzymes Recent Labs  Lab 02/10/17 1243  TROPONINI 1.68*   No results for input(s): TROPIPOC in the last 168 hours.  BNPNo results for input(s): BNP, PROBNP in the last 168 hours.  DDimer No results for input(s): DDIMER in the last 168 hours.  Radiology/Studies:  Dg Chest 2 View  Result Date: 02/10/2017 CLINICAL DATA:  Cough and fatigue EXAM: CHEST  2 VIEW COMPARISON:  January 29, 2017 FINDINGS: There remains airspace consolidation in the right base with small pleural effusions bilaterally. There is cardiomegaly with pulmonary vascularity within normal limits. No adenopathy. Central catheter tip is at the cavoatrial junction. No pneumothorax. No evident bone lesions. IMPRESSION: Stable cardiac prominence with bilateral pleural effusions. Suspect superimposed pneumonia right base. Lungs elsewhere clear. Pulmonary vascularity appears within normal limits. No pneumothorax. Central catheter position unchanged. Electronically Signed   By: Lowella Grip III M.D.   On: 02/10/2017 13:58    Assessment and Plan:   1. Supratherapeutic INR: Found to be greater than 10.00 on admission.  Patient will be given p.o. vitamin K 5 mg x1.  Holding Coumadin, will discontinue amiodarone for now.  2.  Paroxysmal atrial fibrillation: Currently sinus bradycardia with intraventricular conduction delay.  She remains on amiodarone which will now be discontinued in light of elevated INR on Coumadin. CHADS VASC Score of 6.   3.  NSTEMI: Patient has elevated troponin of 1.68.  Patient would not be a candidate for cardiac catheterization with elevated PT/INR currently.  Has history of coronary artery disease with stents to mid RCA, and distal RCA/PDA.  Has been on metoprolol but this was discontinued during recent hospitalization due to bradycardia, and taking doses higher than originally prescribed.  4.  Chronic HFrEF: Most recent echocardiogram revealed an  ejection fraction 30-35%.  Fluid balance is achieved through dialysis.  5.  Chronic kidney disease stage IV recommend nephrology consultation for ongoing dialysis.  Dialysis is normally on Tuesdays Thursdays and Saturdays.       For questions or updates, please contact [place] Please consult [email or web address] for contact info under Cardiology/STEMI.   Signed, Phill Myron. West Pugh, ANP, AACC  02/10/2017 3:09 PM   The patient was seen and examined, and I agree with the history, physical exam, assessment and plan as documented above, with modifications as noted below. I have also personally reviewed all relevant documentation, old records, labs, and both radiographic and cardiovascular studies. I have also independently interpreted old and new ECG's.  71 yr old woman well known to me with multiple medical issues as detailed above. She was recently hospitalized for weakness deemed secondary to junctional bradycardia. Toprol-XL was discontinued. Compalints of weakness began when she started hemodialysis within the past several weeks.  She has had problems with supratherapeutic INRs.  She is on amiodarone.  Yesterday a blood draw was unable to be obtained.  INR was greater than  8 on 02/06/17.   She also had some epistaxis and was instructed to go to the ED by our [facility] office staff. She tells me she also noticed blood in her underwear this morning. She specifically denies chest pain and dyspnea. She dialyzes Tuesday, Thursday, and Saturday.  INR today is markedly elevated, greater than 10.  Prothrombin time is greater than 90. She says she is not taking warfarin and it has been on hold for the past 10 days.  Troponin is elevated to 1.68.  He had been elevated to the 7 range at the end of September 2018 and was deemed secondary to chronic obstructive coronary disease and consequent demand ischemia in the setting of decompensated heart failure. INR was mildly elevated and peaked at 0.11  on 01/29/17.  Other relevant labs: BUN 33, creatinine 4.06, hemoglobin 10.5.  Chest x-ray today shows stable cardiac prominence with bilateral pleural effusions with a suspicion for superimposed pneumonia at the right base peer lungs were otherwise clear and pulmonary vascularity appear to be within normal limits.  ECG performed today which I personally interpreted demonstrated sinus rhythm with old inferior and anterior infarcts and a nonspecific intraventricular conduction delay.  Recommendations: INR is supratherapeutic in spite of being off of warfarin for the past 10 days.  I will stop amiodarone and administer oral vitamin K 5 mg, as she is not experiencing any clinically significant bleeding (only mild epistaxis).  With respect to elevated troponins, will manage medically for now given ESRD and lack of ischemia by nuclear stress testing on 11/15/16. An invasive strategy would not be pursued at this time regardless given supratherapeutic INR.   For now, continue nitrates, statin (Crestor should be reduced to 10 mg daily given ESRD), and clopidogrel.  She will need nephrology involvement so that she can be dialyzed tomorrow.  She is also depressed ever since her husband passed away and has not eaten much in the past month, also contributing to her generalized weakness and fatigue. I will defer management of this to her PCP.   Kate Sable, MD, [facility]  02/10/2017 3:35 PM

## 2017-02-10 NOTE — ED Triage Notes (Signed)
Pt reports was started on dialysis x1 month ago. Pt reports generalized malaise and fatigue. Pt reports last dialysis was 02/09/17.

## 2017-02-10 NOTE — ED Notes (Signed)
Also complaining of urine odor.

## 2017-02-10 NOTE — ED Notes (Signed)
Pt resting comfortably with family at bedside

## 2017-02-10 NOTE — Telephone Encounter (Signed)
LMTCB

## 2017-02-10 NOTE — H&P (Signed)
History and Physical  [name] RKY:[id number] DOB: Sep 19, 1945 DOA: 02/10/2017  Referring physician: Dr Oleta Mouse, ED physician PCP: [name], MD  Outpatient Specialists:   Dia Sitter (nephrology)  Patient Coming From: Home via cardiology office  Chief Complaint: Elevated INR  HPI: [name] is a 71 y.o. female with a history of ischemic cardiomyopathy with an STEMI in 2017 with stent placements, end-stage renal disease on dialysis (recent start of dialysis), coronary artery disease, chronic combined systolic and diastolic heart failure, hypertension, GERD, hyperlipidemia, type 2 diabetes.  Patient has been feeling weak and tired over the past few weeks.  She was recently hospitalized at the end of November due to hypoglycemia secondary to insulin use and poor p.o. intake.  Her poor p.o. intake is secondary to her husband passing away last month.  Her insulin was stopped.  Her INR has been elevated over the past several days and, according to the patient, her Coumadin was stopped about a week ago.  Her INR has continued to be elevated and has not returned to normal levels.  Today, she was instructed to go to the radiology office for an INR, which was greater than 10.  Repeat in the emergency department was the same.  Laboratory data shows elevated troponin at 1.68.  Cardiology was consulted, who is doubtful that this is an NSTEMI or ischemic in nature, but applies this is secondary to elevation due to end-stage renal disease.  No palliating factors.  No chest pain  Review of Systems:   Patient had some bleeding when she wiped after using the bathroom this morning.  She also had some bleeding on her Depends.  Surgical history includes complete abdominal hysterectomy.  Pt denies any fevers, chills, nausea, vomiting, diarrhea, constipation, abdominal pain, shortness of breath, dyspnea on exertion, orthopnea, cough, wheezing, palpitations, headache, vision  changes, lightheadedness, dizziness, melena, rectal bleeding.  Review of systems are otherwise negative  Past Medical History:  Diagnosis Date  . Acute renal failure superimposed on stage 4 chronic kidney disease ([place]) 07/04/2015  . Allergic rhinitis   . Anemia   . Anxiety   . CAD in native artery    NSTEMI 07/2015 s/p DES to RCA and posterior PDA, PCI 10/2015 with scoring balloon to 85% ISR of distal RCA), known LAD/Cx disease treated medically  . Carotid artery disease ([place])    Mild bilateral carotid disease (1-39% 07/2015)  . Chronic combined systolic and diastolic CHF (congestive heart failure) ([place])   . CKD (chronic kidney disease), stage IV ([place])   . Constipation   . Essential hypertension   . GERD (gastroesophageal reflux disease)   . History of hysterectomy   . Hyperlipidemia   . Low back pain   . Obesity   . 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 Osteoarthritis   . Overactive bladder   . PVC's (premature ventricular contractions)   . Type 2 diabetes mellitus ([place])    Past Surgical History:  Procedure Laterality Date  . ABDOMINAL HYSTERECTOMY    . AV FISTULA PLACEMENT Left 12/30/2016   Procedure: insertion of left upper arm gortex GRAFT;  Surgeon: Angelia Mould, MD;  Location: [facility] OR;  Service: Vascular;  Laterality: Left;  . BACK SURGERY     multiple  . CARDIAC CATHETERIZATION  04/04/2006   Est EF of 60%  . CARDIAC CATHETERIZATION N/A 07/04/2015   Procedure: Left Heart  Cath and Coronary Angiography;  Surgeon: Troy Sine, MD;  Location: [place] [facility];  Service: Cardiovascular;  Laterality: N/A;  . CARDIAC CATHETERIZATION N/A 07/04/2015   Procedure: Coronary Stent Intervention;  Surgeon: Troy Sine, MD;  Location: [place] [facility];  Service: Cardiovascular;  Laterality: N/A;  . CARDIAC CATHETERIZATION N/A 10/13/2015   Procedure: Left Heart Cath and Coronary Angiography;  Surgeon:  Troy Sine, MD;  Location: [place] [facility];  Service: Cardiovascular;  Laterality: N/A;  . CARDIAC CATHETERIZATION N/A 12/02/2015   Procedure: Left Heart Cath and Coronary Angiography;  Surgeon: Peter M Martinique, MD;  Location: [place] [facility];  Service: Cardiovascular;  Laterality: N/A;  . CARPAL TUNNEL RELEASE    . CERVICAL BIOPSY     cervical lymph node biopsies  . COLONOSCOPY  May 2002   Dr. Irving Shows :Followup in 5 years, normal exam  . COLONOSCOPY  2008   Dr. Laural Golden: Very redundant colon with mild melanosis coli, splenic flexure polyp biopsy with acute complaint of benign colon polyp. Recommended ten-year followup  . CORONARY STENT PLACEMENT  04/11/2006   2 -- Taxus stents to the circumflex   . IR FLUORO GUIDE CV LINE RIGHT  01/07/2017  . IR FLUORO GUIDE CV LINE RIGHT  01/16/2017  . IR US GUIDE VASC ACCESS RIGHT  01/07/2017  . IR US GUIDE VASC ACCESS RIGHT  01/16/2017  . PERIPHERAL VASCULAR CATHETERIZATION Right 07/09/2015   Procedure: Upper Extremity Angiography;  Surgeon: Conrad Peotone, MD;  Location: [place] [facility];  Service: Cardiovascular;  Laterality: Right;  . PERIPHERAL VASCULAR CATHETERIZATION Right 07/10/2015   Procedure: RIGHT SUBCLAVIAN ARTERY THROMBECTOMY;  Surgeon: Serafina Mitchell, MD;  Location: [facility] OR;  Service: Vascular;  Laterality: Right;  . Tendonitis     bilateral elbow  . TRIGGER FINGER RELEASE     Social History:  reports that  has never smoked. she has never used smokeless tobacco. She reports that she does not drink alcohol or use drugs. Patient lives at home  Allergies  Allergen Reactions  . Ace Inhibitors Cough  . Amlodipine Swelling    UNSPECIFIED EDEMA   . Codeine Rash    Family History  Problem Relation Age of Onset  . Diabetes Father   . Hypertension Father   . Stroke Father   . Hypertension Brother   . Aneurysm Brother   . Diabetes Brother   . Colon cancer Neg Hx       Prior to Admission medications   Medication Sig Start Date  End Date Taking? Authorizing Provider  albuterol (PROVENTIL HFA;VENTOLIN HFA) 108 (90 Base) MCG/ACT inhaler Inhale 1-2 puffs into the lungs every 6 (six) hours as needed for wheezing or shortness of breath. 04/01/16   Varney Biles, MD  albuterol (PROVENTIL) (2.5 MG/3ML) 0.083% nebulizer solution Take 3 mLs (2.5 mg total) by nebulization every 6 (six) hours as needed for wheezing or shortness of breath. 07/16/16   Elgergawy, Silver Huguenin, MD  ALPRAZolam Duanne Moron) 0.5 MG tablet Take 0.5 mg by mouth at bedtime.    [provider]  amiodarone (PACERONE) 200 MG tablet Take 1 tablet (200 mg total) by mouth daily. 02/02/17   Isaac Bliss, Rayford Halsted, MD  calcitRIOL (ROCALTROL) 0.5 MCG capsule Take 1 capsule (0.5 mcg total) daily by mouth. 01/18/17   Regalado, Belkys A, MD  cetirizine (ZYRTEC) 10 MG tablet Take 5 mg by mouth daily. 10/24/16   [provider]  clopidogrel (PLAVIX) 75 MG  tablet Take 1 tablet (75 mg total) by mouth daily. 12/13/16   Georgette Shell, MD  cyclobenzaprine (FLEXERIL) 5 MG tablet Take 5 mg by mouth as needed (lower back pain). osteoporosis with out current pathological fracture for 14 days 10/03/16   [provider]  ferrous sulfate 325 (65 FE) MG tablet Take 325 mg by mouth daily with breakfast.     [provider]  fluticasone (FLONASE) 50 MCG/ACT nasal spray Place 1 spray into both nostrils daily.    [provider]  Insulin Glargine (LANTUS) 100 UNIT/ML Solostar Pen Inject 30 Units into the skin daily at 10 pm.    [provider]  insulin lispro (HUMALOG) 100 UNIT/ML KiwkPen Inject 5-11 Units into the skin 3 (three) times daily before meals.    [provider]  isosorbide mononitrate (IMDUR) 30 MG 24 hr tablet Take 1 tablet (30 mg total) 2 (two) times daily by mouth. 01/17/17   Regalado, Belkys A, MD  latanoprost (XALATAN) 0.005 % ophthalmic solution Place 1 drop into both eyes at bedtime.     [provider]    meclizine (ANTIVERT) 25 MG tablet Take 12.5 mg by mouth 2 (two) times daily.     [provider]  nitroGLYCERIN (NITROSTAT) 0.4 MG SL tablet Place 1 tablet (0.4 mg total) under the tongue every 5 (five) minutes as needed for chest pain. 10/14/15   Lyda Jester M, PA-C  pantoprazole (PROTONIX) 40 MG tablet Take 40 mg by mouth daily.     [provider]  rosuvastatin (CRESTOR) 40 MG tablet Take 1 tablet (40 mg total) by mouth at bedtime. 07/12/15   Lily Kocher, MD    Physical Exam: BP (!) 110/59   Pulse 63   Temp 98 F (36.7 C) (Oral)   Resp 18   Ht 5\' 8"  (1.727 m)   Wt 91.6 kg (202 lb)   SpO2 99%   BMI 30.71 kg/m   General: Elderly black female. Awake and alert and oriented x3. No acute cardiopulmonary distress.  HEENT: Normocephalic atraumatic.  Right and left ears normal in appearance.  Pupils equal, round, reactive to light. Extraocular muscles are intact. Sclerae anicteric and noninjected.  Moist mucosal membranes. No mucosal lesions.  Neck: Neck supple without lymphadenopathy. No carotid bruits. No masses palpated.  Cardiovascular: Regular rhythm.  Normal S1 S2 . No murmurs, rubs, gallops auscultated. No JVD.  Respiratory: Good respiratory effort with no wheezes, rales, rhonchi. Lungs clear to auscultation bilaterally.  No accessory muscle use. Abdomen: Soft, nontender, nondistended. Active bowel sounds. No masses or hepatosplenomegaly  Skin: Stage II-III sacral decubitus ulcer, lesions, or ulcerations.  Dry, warm to touch. 2+ dorsalis pedis and radial pulses. Musculoskeletal: No calf or leg pain. All major joints not erythematous nontender.  No upper or lower joint deformation.  Good ROM.  No contractures  Psychiatric: Intact judgment and insight. Pleasant and cooperative. Neurologic: No focal neurological deficits. Strength is 5/5 and symmetric in upper and lower extremities.  Cranial nerves II through XII are grossly intact.           Labs on Admission:  I have personally reviewed following labs and imaging studies  CBC: Recent Labs  Lab 02/10/17 1243  WBC 7.2  NEUTROABS 6.3  HGB 10.5*  HCT 36.1  MCV 96.0  PLT 409   Basic Metabolic Panel: Recent Labs  Lab 02/10/17 1243  NA 131*  K 4.6  CL 91*  CO2 28  GLUCOSE 401*  BUN 33*  CREATININE 4.06*  CALCIUM 8.9   GFR: Estimated Creatinine Clearance: 14.8 mL/min (A) (by C-G formula based on SCr of 4.06 mg/dL (H)). Liver Function Tests: Recent Labs  Lab 02/10/17 1243  AST 112*  ALT 60*  ALKPHOS 227*  BILITOT 0.7  PROT 7.2  ALBUMIN 2.8*   No results for input(s): LIPASE, AMYLASE in the last 168 hours. No results for input(s): AMMONIA in the last 168 hours. Coagulation Profile: Recent Labs  Lab 02/06/17 02/10/17 1345  INR >8.0 >10.00*   Cardiac Enzymes: Recent Labs  Lab 02/10/17 1243  TROPONINI 1.68*   BNP (last 3 results) No results for input(s): PROBNP in the last 8760 hours. HbA1C: No results for input(s): HGBA1C in the last 72 hours. CBG: No results for input(s): GLUCAP in the last 168 hours. Lipid Profile: No results for input(s): CHOL, HDL, LDLCALC, TRIG, CHOLHDL, LDLDIRECT in the last 72 hours. Thyroid Function Tests: No results for input(s): TSH, T4TOTAL, FREET4, T3FREE, THYROIDAB in the last 72 hours. Anemia Panel: No results for input(s): VITAMINB12, FOLATE, FERRITIN, TIBC, IRON, RETICCTPCT in the last 72 hours. Urine analysis:    Component Value Date/Time   COLORURINE YELLOW 12/23/2016 1810   APPEARANCEUR CLEAR 12/23/2016 1810   LABSPEC 1.009 12/23/2016 1810   PHURINE 5.0 12/23/2016 1810   GLUCOSEU NEGATIVE 12/23/2016 1810   HGBUR SMALL (A) 12/23/2016 1810   BILIRUBINUR NEGATIVE 12/23/2016 1810   KETONESUR NEGATIVE 12/23/2016 1810   PROTEINUR NEGATIVE 12/23/2016 1810   UROBILINOGEN 0.2 08/23/2007 2037   NITRITE NEGATIVE 12/23/2016 1810   LEUKOCYTESUR LARGE (A) 12/23/2016 1810   Sepsis Labs: @LABRCNTIP (procalcitonin:4,lacticidven:4) )No  results found for this or any previous visit (from the past 240 hour(s)).   Radiological Exams on Admission: Dg Chest 2 View  Result Date: 02/10/2017 CLINICAL DATA:  Cough and fatigue EXAM: CHEST  2 VIEW COMPARISON:  January 29, 2017 FINDINGS: There remains airspace consolidation in the right base with small pleural effusions bilaterally. There is cardiomegaly with pulmonary vascularity within normal limits. No adenopathy. Central catheter tip is at the cavoatrial junction. No pneumothorax. No evident bone lesions. IMPRESSION: Stable cardiac prominence with bilateral pleural effusions. Suspect superimposed pneumonia right base. Lungs elsewhere clear. Pulmonary vascularity appears within normal limits. No pneumothorax. Central catheter position unchanged. Electronically Signed   By: Lowella Grip III M.D.   On: 02/10/2017 13:58    EKG: Independently reviewed.  Normal sinus rhythm.  Intraventricular conduction delay.  Old inferior infarct.  Assessment/Plan: Principal Problem:   Supratherapeutic INR Active Problems:   Essential hypertension   Coronary artery disease   GERD   Type 2 diabetes mellitus with stage 4 chronic kidney disease, with long-term current use of insulin (HCC)   Elevated troponin   Hyperglycemia   PAF (paroxysmal atrial fibrillation) (H[facility])   Ischemic cardiomyopathy   ESRD on hemodialysis ([place])   Sacral decubitus ulcer   Failure to thrive in adult    This patient was discussed with the ED physician, including pertinent vitals, physical exam findings, labs, and imaging.  We also discussed care given by the ED provider.  1.  Supratherapeutic INR  Vitamin K given in the emergency department  Admit to stepdown  Recheck INR daily  Watch closely for spontaneous bleeding 2.  Elevated troponin  Recheck troponin now and every 6 hours  Patient asymptomatic 3.  Hyperglycemia  Insulin given in the ED  Recheck CBG in hour  Continue home insulin with sliding  scale 4.  PAF  Currently in sinus rhythm  Hold Coumadin due to supratherapeutic INR  Recheck INR in the morning  Continue amiodarone 5.  Type 2 diabetes  Continue home insulin with sliding scale 6.  End-stage renal disease  Dialysis tomorrow  Consult nephrology for dialysis 7.  Ischemic cardiomyopathy 8.  GERD  Continue PPI 9.  Essential hypertension  Continue home medication 10.  Sacral decubitus ulcer  Barrier cream  Consult wound care 11.  Failure to thrive in adult  Consult nutrition  ensure  DVT prophylaxis: supratherapeutic Consultants: nephrology Code Status: full code Family Communication: daughter in room  Disposition Plan: home following admission   Shamyah Stantz [place] [facility] Pager [phone]  If 7PM-7AM, please contact night-coverage [email or web address] Password [email or web address]

## 2017-02-10 NOTE — ED Notes (Signed)
Unable to give report at this time. ICU RN to call back.

## 2017-02-10 NOTE — ED Notes (Signed)
CRITICAL VALUE ALERT  Critical Value:  1.68 Troponin  Date & Time Notied:  02-10-17  Provider Notified: Dr. Oleta Mouse  Orders Received/Actions taken: continue to monitor.

## 2017-02-10 NOTE — Telephone Encounter (Signed)
Patient called stating that she is not feeling well.  Wanted to speak with nurse to see if medication could be causing.

## 2017-02-10 NOTE — ED Provider Notes (Signed)
[facility] EMERGENCY DEPARTMENT Provider Note   CSN: [id number] Arrival date & time: 02/10/17  1137     History   Chief Complaint Chief Complaint  Patient presents with  . Weakness    HPI [name] is a 71 y.o. female.  HPI 71 year old female who presents with weakness. History of ESRD, CAD, CHF, HTN, HLD, PAF on coumadin and plavix. Reports 2-3 weeks of generalized weakness, fatigue, and decreased appetite. Persistent since leaving hospital at the end of November. Was recently started on dialysis over one month ago. She spoke with her cardiologist and it was recommended that she have an INR check. Denies melena, hematochezia, fever, chest pain, difficulty breathing, abdominal pain. Makes minimal urine she states. No syncope or near syncope. Has had mild cough, no sputum.  Records reviewed. Patient admitted at the end of Nov 2018 for weakness, noted to have junctional bradycardia. Symptoms were attributed to bet blocker.   Past Medical History:  Diagnosis Date  . Acute renal failure superimposed on stage 4 chronic kidney disease ([place]) 07/04/2015  . Allergic rhinitis   . Anemia   . Anxiety   . CAD in native artery    NSTEMI 07/2015 s/p DES to RCA and posterior PDA, PCI 10/2015 with scoring balloon to 85% ISR of distal RCA), known LAD/Cx disease treated medically  . Carotid artery disease ([place])    Mild bilateral carotid disease (1-39% 07/2015)  . Chronic combined systolic and diastolic CHF (congestive heart failure) ([place])   . CKD (chronic kidney disease), stage IV ([place])   . Constipation   . Essential hypertension   . GERD (gastroesophageal reflux disease)   . History of hysterectomy   . Hyperlipidemia   . Low back pain   . Obesity   . 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 Osteoarthritis   . Overactive bladder   . PVC's (premature ventricular contractions)   . Type 2 diabetes mellitus [facility])      Patient Active Problem List   Diagnosis Date Noted  . Supratherapeutic INR 02/10/2017  . Sinus bradycardia   . ESRD on hemodialysis ([place])   . 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 Encounter for therapeutic drug monitoring 01/19/2017  . Pressure injury of skin 01/16/2017  . Rhonchi   . Hypervolemia   . Hypokalemia 12/23/2016  . Goals of care, counseling/discussion   . Palliative care encounter   . Hypercoagulopathy ([place]) 12/03/2016  . Acute on chronic heart failure ([place]) 12/03/2016  . Ischemic cardiomyopathy   . Generalized weakness   . Atrial fibrillation with RVR ([place]) 11/19/2016  . Abnormal nuclear stress test   . Left ventricular dysfunction   . PAF (paroxysmal atrial fibrillation) ([place]) 11/13/2016  . Anemia in chronic kidney disease   . Atrial fibrillation, new onset ([place]) 11/10/2016  . Hypoglycemia due to insulin 11/10/2016  . Pleural effusion on right 10/31/2016  . HCAP (healthcare-associated pneumonia) 10/31/2016  . DM type 2 causing vascular disease ([place]) 10/05/2016  . Personal history of noncompliance with medical treatment, presenting hazards to health 08/22/2016  . Hyperglycemia 08/07/2016  . Pulmonary edema 07/14/2016  . CKD (chronic kidney disease), stage IV ([place]) 07/14/2016  . Acute respiratory failure with hypoxia ([place]) 07/09/2016  . Acute on chronic systolic CHF (congestive heart failure) ([place]) 12/01/2015  . AKI (acute kidney injury) ([place])   . Ischemia of upper extremity   . Right knee pain   . Subclavian artery  stenosis, right ([place]) 07/07/2015  . Acute on chronic combined systolic and diastolic CHF (congestive heart failure) ([place]) 07/04/2015  . Elevated troponin 07/04/2015  . Acute renal failure superimposed on chronic kidney disease ([place]) 07/04/2015  . Non-ST elevation (NSTEMI) myocardial infarction ([place]) 07/04/2015  . Elevated d-dimer 07/04/2015  . Acute respiratory failure with hypercapnia ([place])   . Bilateral lower extremity edema 01/20/2012  .  Type 2 diabetes mellitus with stage 4 chronic kidney disease, with long-term current use of insulin ([place]) 01/21/2011  . Overweight 07/20/2009  . Coronary artery disease 10/08/2008  . HEART MURMUR, SYSTOLIC 36/64/4034  . SHOULDER PAIN 02/05/2007  . Hyperlipemia 04/11/2006  . Anxiety state 04/11/2006  . SYNDROME, CARPAL TUNNEL 04/11/2006  . Essential hypertension 04/11/2006  . ALLERGIC RHINITIS 04/11/2006  . GERD 04/11/2006  . Constipation 04/11/2006  . OVERACTIVE BLADDER 04/11/2006  . OSTEOARTHRITIS 04/11/2006  . LOW BACK PAIN 04/11/2006    Past Surgical History:  Procedure Laterality Date  . ABDOMINAL HYSTERECTOMY    . AV FISTULA PLACEMENT Left 12/30/2016   Procedure: insertion of left upper arm gortex GRAFT;  Surgeon: Angelia Mould, MD;  Location: [facility] OR;  Service: Vascular;  Laterality: Left;  . BACK SURGERY     multiple  . CARDIAC CATHETERIZATION  04/04/2006   Est EF of 60%  . CARDIAC CATHETERIZATION N/A 07/04/2015   Procedure: Left Heart Cath and Coronary Angiography;  Surgeon: Troy Sine, MD;  Location: [place] CV LAB;  Service: Cardiovascular;  Laterality: N/A;  . CARDIAC CATHETERIZATION N/A 07/04/2015   Procedure: Coronary Stent Intervention;  Surgeon: Troy Sine, MD;  Location: [place] CV LAB;  Service: Cardiovascular;  Laterality: N/A;  . CARDIAC CATHETERIZATION N/A 10/13/2015   Procedure: Left Heart Cath and Coronary Angiography;  Surgeon: Troy Sine, MD;  Location: [place] CV LAB;  Service: Cardiovascular;  Laterality: N/A;  . CARDIAC CATHETERIZATION N/A 12/02/2015   Procedure: Left Heart Cath and Coronary Angiography;  Surgeon: Peter M [place], MD;  Location: [place] CV LAB;  Service: Cardiovascular;  Laterality: N/A;  . CARPAL TUNNEL RELEASE    . CERVICAL BIOPSY     cervical lymph node biopsies  . COLONOSCOPY  May 2002   Dr. Irving Shows :Followup in 5 years, normal exam  . COLONOSCOPY  2008   Dr. Laural Golden: Very redundant colon with mild  melanosis coli, splenic flexure polyp biopsy with acute complaint of benign colon polyp. Recommended ten-year followup  . CORONARY STENT PLACEMENT  04/11/2006   2 -- Taxus stents to the circumflex   . IR FLUORO GUIDE CV LINE RIGHT  01/07/2017  . IR FLUORO GUIDE CV LINE RIGHT  01/16/2017  . IR US GUIDE VASC ACCESS RIGHT  01/07/2017  . IR US GUIDE VASC ACCESS RIGHT  01/16/2017  . PERIPHERAL VASCULAR CATHETERIZATION Right 07/09/2015   Procedure: Upper Extremity Angiography;  Surgeon: Conrad [place], MD;  Location: [place] CV LAB;  Service: Cardiovascular;  Laterality: Right;  . PERIPHERAL VASCULAR CATHETERIZATION Right 07/10/2015   Procedure: RIGHT SUBCLAVIAN ARTERY THROMBECTOMY;  Surgeon: Serafina Mitchell, MD;  Location: [facility] OR;  Service: Vascular;  Laterality: Right;  . Tendonitis     bilateral elbow  . TRIGGER FINGER RELEASE      OB History    No data available       Home Medications    Prior to Admission medications   Medication Sig Start Date End Date Taking? Authorizing Provider  albuterol (PROVENTIL HFA;VENTOLIN HFA) 108 (90  Base) MCG/ACT inhaler Inhale 1-2 puffs into the lungs every 6 (six) hours as needed for wheezing or shortness of breath. 04/01/16   Varney Biles, MD  albuterol (PROVENTIL) (2.5 MG/3ML) 0.083% nebulizer solution Take 3 mLs (2.5 mg total) by nebulization every 6 (six) hours as needed for wheezing or shortness of breath. 07/16/16   Elgergawy, Silver Huguenin, MD  ALPRAZolam Duanne Moron) 0.5 MG tablet Take 0.5 mg by mouth at bedtime.    [provider]  amiodarone (PACERONE) 200 MG tablet Take 1 tablet (200 mg total) by mouth daily. 02/02/17   Isaac Bliss, Rayford Halsted, MD  calcitRIOL (ROCALTROL) 0.5 MCG capsule Take 1 capsule (0.5 mcg total) daily by mouth. 01/18/17   Regalado, Belkys A, MD  cetirizine (ZYRTEC) 10 MG tablet Take 5 mg by mouth daily. 10/24/16   [provider]  clopidogrel (PLAVIX) 75 MG tablet Take 1 tablet (75 mg total) by mouth daily. 12/13/16    Georgette Shell, MD  cyclobenzaprine (FLEXERIL) 5 MG tablet Take 5 mg by mouth as needed (lower back pain). osteoporosis with out current pathological fracture for 14 days 10/03/16   [provider]  ferrous sulfate 325 (65 FE) MG tablet Take 325 mg by mouth daily with breakfast.     [provider]  fluticasone (FLONASE) 50 MCG/ACT nasal spray Place 1 spray into both nostrils daily.    [provider]  Insulin Glargine (LANTUS) 100 UNIT/ML Solostar Pen Inject 30 Units into the skin daily at 10 pm.    [provider]  insulin lispro (HUMALOG) 100 UNIT/ML KiwkPen Inject 5-11 Units into the skin 3 (three) times daily before meals.    [provider]  isosorbide mononitrate (IMDUR) 30 MG 24 hr tablet Take 1 tablet (30 mg total) 2 (two) times daily by mouth. 01/17/17   Regalado, Belkys A, MD  latanoprost (XALATAN) 0.005 % ophthalmic solution Place 1 drop into both eyes at bedtime.     [provider]  meclizine (ANTIVERT) 25 MG tablet Take 12.5 mg by mouth 2 (two) times daily.     [provider]  nitroGLYCERIN (NITROSTAT) 0.4 MG SL tablet Place 1 tablet (0.4 mg total) under the tongue every 5 (five) minutes as needed for chest pain. 10/14/15   Lyda Jester M, PA-C  pantoprazole (PROTONIX) 40 MG tablet Take 40 mg by mouth daily.     [provider]  rosuvastatin (CRESTOR) 40 MG tablet Take 1 tablet (40 mg total) by mouth at bedtime. 07/12/15   Lily Kocher, MD    Family History Family History  Problem Relation Age of Onset  . Diabetes Father   . Hypertension Father   . Stroke Father   . Hypertension Brother   . Aneurysm Brother   . Diabetes Brother   . Colon cancer Neg Hx     Social History Social History   Tobacco Use  . Smoking status: Never Smoker  . Smokeless tobacco: Never Used  Substance Use Topics  . Alcohol use: No    Alcohol/week: 0.0 oz  . Drug use: No     Allergies   Ace inhibitors;  Amlodipine; and Codeine   Review of Systems Review of Systems  Constitutional: Positive for fatigue. Negative for fever.  Respiratory: Positive for cough.   Cardiovascular: Negative for chest pain.  Gastrointestinal: Negative for abdominal pain, blood in stool, diarrhea, nausea and vomiting.  Skin: Positive for wound (decub ulcer).  Neurological: Negative for syncope, light-headedness and numbness.  Hematological: Bruises/bleeds  easily.  All other systems reviewed and are negative.    Physical Exam Updated Vital Signs BP (!) 110/59   Pulse 63   Temp 98 F (36.7 C) (Oral)   Resp 18   Ht 5\' 8"  (1.727 m)   Wt 91.6 kg (202 lb)   SpO2 99%   BMI 30.71 kg/m   Physical Exam Physical Exam  Nursing note and vitals reviewed. Constitutional: Well developed, well nourished, non-toxic, and in no acute distress Head: Normocephalic and atraumatic.  Mouth/Throat: Oropharynx is clear and moist.  Neck: Normal range of motion. Neck supple.  Cardiovascular: Normal rate and regular rhythm.   Pulmonary/Chest: Effort normal and breath sounds normal.  Abdominal: Soft. There is no tenderness. There is no rebound and no guarding. Brown stool on rectal.  Musculoskeletal: Normal range of motion.  Neurological: Alert, no facial droop, fluent speech, moves all extremities symmetrically Skin: Skin is warm and dry.  Psychiatric: Cooperative   ED Treatments / Results  Labs (all labs ordered are listed, but only abnormal results are displayed) Labs Reviewed  CBC WITH DIFFERENTIAL/PLATELET - Abnormal; Notable for the following components:      Result Value   RBC 3.76 (*)    Hemoglobin 10.5 (*)    MCHC 29.1 (*)    RDW 20.8 (*)    Lymphs Abs 0.5 (*)    All other components within normal limits  COMPREHENSIVE METABOLIC PANEL - Abnormal; Notable for the following components:   Sodium 131 (*)    Chloride 91 (*)    Glucose, Bld 401 (*)    BUN 33 (*)    Creatinine, Ser 4.06 (*)    Albumin 2.8 (*)     AST 112 (*)    ALT 60 (*)    Alkaline Phosphatase 227 (*)    GFR calc non Af Amer 10 (*)    GFR calc Af Amer 12 (*)    All other components within normal limits  TROPONIN I - Abnormal; Notable for the following components:   Troponin I 1.68 (*)    All other components within normal limits  PROTIME-INR - Abnormal; Notable for the following components:   Prothrombin Time >90.0 (*)    INR >10.00 (*)    All other components within normal limits  PROTIME-INR  CBC WITH DIFFERENTIAL/PLATELET  PROTIME-INR  TROPONIN I  TROPONIN I  TROPONIN I  POC OCCULT BLOOD, ED    EKG  EKG Interpretation  Date/Time:  Friday February 10 2017 12:33:22 EST Ventricular Rate:  64 PR Interval:    QRS Duration: 115 QT Interval:  428 QTC Calculation: 442 R Axis:   29 Text Interpretation:  Sinus rhythm Nonspecific intraventricular conduction delay Inferior infarct, old Consider anterior infarct Lateral leads are also involved Confirmed by Brantley Stage [phone]) on 02/10/2017 1:33:05 PM       Radiology Dg Chest 2 View  Result Date: 02/10/2017 CLINICAL DATA:  Cough and fatigue EXAM: CHEST  2 VIEW COMPARISON:  January 29, 2017 FINDINGS: There remains airspace consolidation in the right base with small pleural effusions bilaterally. There is cardiomegaly with pulmonary vascularity within normal limits. No adenopathy. Central catheter tip is at the cavoatrial junction. No pneumothorax. No evident bone lesions. IMPRESSION: Stable cardiac prominence with bilateral pleural effusions. Suspect superimposed pneumonia right base. Lungs elsewhere clear. Pulmonary vascularity appears within normal limits. No pneumothorax. Central catheter position unchanged. Electronically Signed   By: Lowella Grip III M.D.   On: 02/10/2017 13:58    Procedures Procedures (  including critical care time)  Medications Ordered in ED Medications  phytonadione (VITAMIN K) tablet 5 mg (5 mg Oral Given 02/10/17 1528)  insulin aspart  (novoLOG) injection 15 Units (15 Units Intravenous Given 02/10/17 1801)     Initial Impression / Assessment and Plan / ED Course  I have reviewed the triage vital signs and the nursing notes.  Pertinent labs & imaging results that were available during my care of the patient were reviewed by me and considered in my medical decision making (see chart for details).      71 year old female who presents with generalized weakness.  She is nontoxic in no acute distress with normal vital signs.  Nonfocal exam.  No focal deficits neurologically.  Work is concerning for supratherapeutic INR greater than 10.  Has had some intermittent epistaxis, but no current active bleeding.  She also has an elevated troponin of 1.68, higher than her baseline.  Her EKG is nonischemic she has not had any chest pain or difficulty breathing.  I did consult Dr. Bronson Ing from cardiology. He recommended admission for vitamin K, serial troponin and dialysis.   Discussed with Dr. Nehemiah Settle, who will admit for ongoing management.      Last cath report copied and pasted below: Left Heart Cath and Coronary Angiography 12/02/15  Mid LAD lesion, 60 %stenosed.  Prox Cx to Mid Cx lesion, 99 %stenosed.  Mid RCA lesion, 0 %stenosed at site of prior stent  Dist RCA lesion, 30%stenosed at site of prior stent.  Prox RCA lesion, 40 %stenosed.  Ost Cx to Prox Cx lesion, 80 %stenosed.  LV end diastolic pressure is moderately elevated.  1. 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 Patent stents in the mid RCA and distal RCA/PDA 3. Moderate mid LAD disease- unchanged from prior studies. 4. Elevated LVEDP       Final Clinical Impressions(s) / ED Diagnoses   Final diagnoses:  Weakness  Supratherapeutic INR  Elevated troponin    ED Discharge Orders    None       Forde Dandy, MD 02/10/17 1819

## 2017-02-10 NOTE — ED Notes (Signed)
Pt states cardiologist sent her here because they couldn't get blood for her INR test due yesterday. Pt states she has just been weak since starting diaylsis in November.

## 2017-02-10 NOTE — Telephone Encounter (Signed)
On 12/4 INR was greater than 8. Went to have redrawn yesterday and could not get blood draw. Patient is now very weak and having bleeding out of her nose. Advised patient she needed to go to the ER.

## 2017-02-11 ENCOUNTER — Other Ambulatory Visit: Payer: Self-pay | Admitting: "Endocrinology

## 2017-02-11 LAB — CBC
HCT: 33.5 % — ABNORMAL LOW (ref 36.0–46.0)
Hemoglobin: 10 g/dL — ABNORMAL LOW (ref 12.0–15.0)
MCH: 28.4 pg (ref 26.0–34.0)
MCHC: 29.9 g/dL — ABNORMAL LOW (ref 30.0–36.0)
MCV: 95.2 fL (ref 78.0–100.0)
PLATELETS: 121 10*3/uL — AB (ref 150–400)
RBC: 3.52 MIL/uL — AB (ref 3.87–5.11)
RDW: 20.8 % — ABNORMAL HIGH (ref 11.5–15.5)
WBC: 6.9 10*3/uL (ref 4.0–10.5)

## 2017-02-11 LAB — BASIC METABOLIC PANEL
ANION GAP: 9 (ref 5–15)
BUN: 39 mg/dL — ABNORMAL HIGH (ref 6–20)
CALCIUM: 8.8 mg/dL — AB (ref 8.9–10.3)
CO2: 27 mmol/L (ref 22–32)
Chloride: 98 mmol/L — ABNORMAL LOW (ref 101–111)
Creatinine, Ser: 4.56 mg/dL — ABNORMAL HIGH (ref 0.44–1.00)
GFR calc non Af Amer: 9 mL/min — ABNORMAL LOW (ref >60)
GFR, EST AFRICAN AMERICAN: 10 mL/min — AB (ref >60)
Glucose, Bld: 133 mg/dL — ABNORMAL HIGH (ref 65–99)
POTASSIUM: 3.6 mmol/L (ref 3.5–5.1)
Sodium: 134 mmol/L — ABNORMAL LOW (ref 135–145)

## 2017-02-11 LAB — RENAL FUNCTION PANEL
Albumin: 2.5 g/dL — ABNORMAL LOW (ref 3.5–5.0)
Anion gap: 12 (ref 5–15)
BUN: 43 mg/dL — ABNORMAL HIGH (ref 6–20)
CALCIUM: 8.7 mg/dL — AB (ref 8.9–10.3)
CHLORIDE: 95 mmol/L — AB (ref 101–111)
CO2: 26 mmol/L (ref 22–32)
CREATININE: 4.83 mg/dL — AB (ref 0.44–1.00)
GFR, EST AFRICAN AMERICAN: 9 mL/min — AB (ref >60)
GFR, EST NON AFRICAN AMERICAN: 8 mL/min — AB (ref >60)
Glucose, Bld: 273 mg/dL — ABNORMAL HIGH (ref 65–99)
Phosphorus: 3.8 mg/dL (ref 2.5–4.6)
Potassium: 3.8 mmol/L (ref 3.5–5.1)
Sodium: 133 mmol/L — ABNORMAL LOW (ref 135–145)

## 2017-02-11 LAB — CBC WITH DIFFERENTIAL/PLATELET
Basophils Absolute: 0 10*3/uL (ref 0.0–0.1)
Basophils Relative: 0 %
Eosinophils Absolute: 0 10*3/uL (ref 0.0–0.7)
Eosinophils Relative: 0 %
HEMATOCRIT: 33.1 % — AB (ref 36.0–46.0)
HEMOGLOBIN: 9.9 g/dL — AB (ref 12.0–15.0)
LYMPHS ABS: 0.7 10*3/uL (ref 0.7–4.0)
LYMPHS PCT: 11 %
MCH: 28.5 pg (ref 26.0–34.0)
MCHC: 29.9 g/dL — AB (ref 30.0–36.0)
MCV: 95.4 fL (ref 78.0–100.0)
MONOS PCT: 7 %
Monocytes Absolute: 0.4 10*3/uL (ref 0.1–1.0)
NEUTROS ABS: 4.9 10*3/uL (ref 1.7–7.7)
NEUTROS PCT: 82 %
Platelets: 163 10*3/uL (ref 150–400)
RBC: 3.47 MIL/uL — AB (ref 3.87–5.11)
RDW: 20.8 % — ABNORMAL HIGH (ref 11.5–15.5)
WBC: 6.1 10*3/uL (ref 4.0–10.5)

## 2017-02-11 LAB — GLUCOSE, CAPILLARY
GLUCOSE-CAPILLARY: 95 mg/dL (ref 65–99)
GLUCOSE-CAPILLARY: 126 mg/dL — AB (ref 65–99)
GLUCOSE-CAPILLARY: 189 mg/dL — AB (ref 65–99)
GLUCOSE-CAPILLARY: 145 mg/dL — AB (ref 65–99)
Glucose-Capillary: 32 mg/dL — CL (ref 65–99)
Glucose-Capillary: 42 mg/dL — CL (ref 65–99)

## 2017-02-11 LAB — PROTIME-INR
INR: >10
INR: 4.51 — AB
PROTHROMBIN TIME: 42.2 s — AB (ref 11.4–15.2)

## 2017-02-11 LAB — TROPONIN I
TROPONIN I: 1.62 ng/mL — AB (ref <0.03)
TROPONIN I: 1.34 ng/mL — AB (ref <0.03)
TROPONIN I: 1.09 ng/mL — AB (ref <0.03)
Troponin I: 1.71 ng/mL (ref <0.03)

## 2017-02-11 LAB — ABO/RH: ABO/RH(D): B POS

## 2017-02-11 MED ORDER — SODIUM CHLORIDE 0.9 % IV SOLN: 100.0000 mL | INTRAVENOUS | Status: DC | PRN

## 2017-02-11 MED ORDER — PENTAFLUOROPROP-TETRAFLUOROETH EX AERO
1.0000 "application " | INHALATION_SPRAY | CUTANEOUS | Status: DC | PRN
  Filled 2017-02-11: qty 30

## 2017-02-11 MED ORDER — SODIUM CHLORIDE 0.9 % IV SOLN: Freq: Once | INTRAVENOUS | Status: DC

## 2017-02-11 MED ORDER — PRO-STAT SUGAR FREE PO LIQD
30.0000 mL | Freq: Two times a day (BID) | ORAL | Status: DC
  Administered 2017-02-11 – 2017-02-22 (×17): 30 mL via ORAL
  Filled 2017-02-11 (×18): qty 30

## 2017-02-11 MED ORDER — EPOETIN ALFA 4000 UNIT/ML IJ SOLN
INTRAMUSCULAR | Status: AC
  Filled 2017-02-11: qty 1

## 2017-02-11 MED ORDER — LIDOCAINE-PRILOCAINE 2.5-2.5 % EX CREA
1.0000 "application " | TOPICAL_CREAM | CUTANEOUS | Status: DC | PRN
  Filled 2017-02-11: qty 5

## 2017-02-11 MED ORDER — DEXTROSE 50 % IV SOLN
INTRAVENOUS | Status: AC
  Administered 2017-02-11: 50 mL
  Filled 2017-02-11: qty 50

## 2017-02-11 MED ORDER — HEPARIN SODIUM (PORCINE) 1000 UNIT/ML DIALYSIS
1000.0000 [IU] | INTRAMUSCULAR | Status: DC | PRN
  Administered 2017-02-14 – 2017-02-16 (×2): 1000 [IU] via INTRAVENOUS_CENTRAL
  Filled 2017-02-11 (×3): qty 1

## 2017-02-11 MED ORDER — ALTEPLASE 2 MG IJ SOLR
2.0000 mg | Freq: Once | INTRAMUSCULAR | Status: DC | PRN
  Filled 2017-02-11: qty 2

## 2017-02-11 MED ORDER — EPOETIN ALFA 4000 UNIT/ML IJ SOLN
4000.0000 [IU] | INTRAMUSCULAR | Status: DC
  Administered 2017-02-14 – 2017-02-21 (×4): 4000 [IU] via INTRAVENOUS
  Filled 2017-02-11 (×6): qty 1

## 2017-02-11 MED ORDER — NEPRO/CARBSTEADY PO LIQD
237.0000 mL | Freq: Two times a day (BID) | ORAL | Status: DC
  Administered 2017-02-12 – 2017-02-22 (×9): 237 mL via ORAL

## 2017-02-11 MED ORDER — LIDOCAINE HCL (PF) 1 % IJ SOLN: 5.0000 mL | INTRAMUSCULAR | Status: DC | PRN

## 2017-02-11 NOTE — Progress Notes (Signed)
Initial Nutrition Assessment  DOCUMENTATION CODES:  Not applicable  INTERVENTION:  Nepro Shake po BID, each supplement provides 425 kcal and 19 grams protein  Will order 30 mL Prostat BID, each supplement provides 100 kcal and 15 grams of protein.  Would recommended avoiding Renal diet while intake is poor  Highly recommend that patient reach out to fatigue, taste changes and poor appetite that come from HD.   Recommend renal appropriate mvi given poor intake  NUTRITION DIAGNOSIS:  Increased nutrient needs related to wound healing, chronic illness(ESRD on HD) as evidenced by estimated nutritional requirements for these conditions  GOAL:  Patient will meet greater than or equal to 90% of their needs  MONITOR:  PO intake, Supplement acceptance, Labs, Weight trends, I & O's, Skin  REASON FOR ASSESSMENT:  Consult Poor PO, Wound healing(FTT)  ASSESSMENT:  71 y/o female PMHx Anxiety,  ischemic cardiomyopathy w/ STEMI in 2017 w/ stents, ESRD on recently started HD (start 11/3), CAD, CHF, HTN, HLD, DM2. Hospitalized in November due to poor intake/insulin use.  Presented with weakness/fatigue for the past few weeks. INR has remained elevated and was >10 at radiology office. Admitted for management.   RD operating remotely on weekends.  Per chart review, she has many factors that can be contributing to poor intake: 7x admissions in last 6 months (including current), recent start on HD in (early November), and recent loss of her husband  Patient's weight trend is relatively stable. She was 195-205 lbs from this time last year up until November, during which she fell to a low of 182 lbs which coincide with her beginning of HD. Her weight rebounded, but, given her history, this likely represents fluid as opposed to actually regain of weigh. Suspect dry weight is ~185 lbs.   There is no documented PO intake at this time. Agree that she should not be placed on Renal diet at this time.  Would  benefit from aggressive supplementation, will order Nepro/Prostat for wound. Would highly recommend that patient reach out to her RD at dialysis for help with addressing the fatigue, taste changes and poor appetite that come from HD.   Labs: Na: 134, Glu: 95-133 (400 on admit), A1C 8/27:8.8.  Albumin:2.8 Meds: Vit D, Iron, Ensure Enlive BID, Insulin, PPI, IVF  Recent Labs  Lab 02/10/17 1243 02/11/17 0459  NA 131* 134*  K 4.6 3.6  CL 91* 98*  CO2 28 27  BUN 33* 39*  CREATININE 4.06* 4.56*  CALCIUM 8.9 8.8*  GLUCOSE 401* 133*   NUTRITION - FOCUSED PHYSICAL EXAM: Unable to assess  Diet Order:  Diet heart healthy/carb modified Room service appropriate? Yes; Fluid consistency: Thin  EDUCATION NEEDS:  Not appropriate for education at this time  Skin: Pressure Ulcer Stage II to sacrum,  DTI L heel  Last BM:  Unknown   Height:  Ht Readings from Last 1 Encounters:  02/10/17 5\' 8"  (1.727 m)   Weight:  Wt Readings from Last 1 Encounters:  02/11/17 196 lb 10.4 oz (89.2 kg)   Wt Readings from Last 10 Encounters:  02/11/17 196 lb 10.4 oz (89.2 kg)  02/02/17 202 lb 9.6 oz (91.9 kg)  01/17/17 183 lb 10.3 oz (83.3 kg)  12/14/16 206 lb (93.4 kg)  12/12/16 203 lb 8 oz (92.3 kg)  11/27/16 205 lb 11 oz (93.3 kg)  11/16/16 208 lb 6.4 oz (94.5 kg)  10/31/16 201 lb 8 oz (91.4 kg)  10/29/16 197 lb (89.4 kg)  10/05/16 195 lb (88.5  kg)   Ideal Body Weight:  63.64 kg  Standard body weight for ht/age: 70kg  BMI:  Body mass index is 29.9 kg/m.  Estimated Nutritional Needs:  Kcal:  1950-2150 kcals (28-31 kcal/kg SBW) Protein:  90-105g Pro (1.3-1.5 g/kg sbw) Fluid:  Per md  Burtis Junes RD, LDN, CNSC Clinical Nutrition Pager: [phone] 02/11/2017 9:21 AM

## 2017-02-11 NOTE — Progress Notes (Signed)
Dr. Clementeen Graham notified of critical INR result of greater than 10.

## 2017-02-11 NOTE — Consult Note (Signed)
Reason for Consult: End-stage renal disease Referring Physician: Dr. Alphonsa Gin is an 71 y.o. female.  HPI: She is a patient who has history of hypertension, diabetes, history of ischemic cardiomyopathy, end-stage renal disease on maintenance hemodialysis presently came because of elevated INR.  Patient also complains of poor appetite, lack of taste for food, severe weakness and malaise since her last admission to the hospital about a month ago.  Patient however denies any nausea or vomiting.  She states that she has some nose bleeding and possibly she might have seen some blood in the urine but no definite sure.  Presently she denies any difficulty breathing.  The consult is called for hemodialysis.  Past Medical History:  Diagnosis Date  . Acute renal failure superimposed on stage 4 chronic kidney disease ([place]) 07/04/2015  . Allergic rhinitis   . Anemia   . Anxiety   . CAD in native artery    NSTEMI 07/2015 s/p DES to RCA and posterior PDA, PCI 10/2015 with scoring balloon to 85% ISR of distal RCA), known LAD/Cx disease treated medically  . Carotid artery disease ([place])    Mild bilateral carotid disease (1-39% 07/2015)  . Chronic combined systolic and diastolic CHF (congestive heart failure) ([place])   . CKD (chronic kidney disease), stage IV ([place])   . Constipation   . Essential hypertension   . GERD (gastroesophageal reflux disease)   . History of hysterectomy   . Hyperlipidemia   . Low back pain   . Obesity   . 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 Osteoarthritis   . Overactive bladder   . PVC's (premature ventricular contractions)   . Type 2 diabetes mellitus ([place])     Past Surgical History:  Procedure Laterality Date  . ABDOMINAL HYSTERECTOMY    . AV FISTULA PLACEMENT Left 12/30/2016   Procedure: insertion of left upper arm gortex GRAFT;  Surgeon: Angelia Mould, MD;  Location: [facility]  OR;  Service: Vascular;  Laterality: Left;  . BACK SURGERY     multiple  . CARDIAC CATHETERIZATION  04/04/2006   Est EF of 60%  . CARDIAC CATHETERIZATION N/A 07/04/2015   Procedure: Left Heart Cath and Coronary Angiography;  Surgeon: Troy Sine, MD;  Location: [place] [facility];  Service: Cardiovascular;  Laterality: N/A;  . CARDIAC CATHETERIZATION N/A 07/04/2015   Procedure: Coronary Stent Intervention;  Surgeon: Troy Sine, MD;  Location: [place] [facility];  Service: Cardiovascular;  Laterality: N/A;  . CARDIAC CATHETERIZATION N/A 10/13/2015   Procedure: Left Heart Cath and Coronary Angiography;  Surgeon: Troy Sine, MD;  Location: [place] [facility];  Service: Cardiovascular;  Laterality: N/A;  . CARDIAC CATHETERIZATION N/A 12/02/2015   Procedure: Left Heart Cath and Coronary Angiography;  Surgeon: Peter M Martinique, MD;  Location: [place] [facility];  Service: Cardiovascular;  Laterality: N/A;  . CARPAL TUNNEL RELEASE    . CERVICAL BIOPSY     cervical lymph node biopsies  . COLONOSCOPY  May 2002   Dr. Irving Shows :Followup in 5 years, normal exam  . COLONOSCOPY  2008   Dr. Laural Golden: Very redundant colon with mild melanosis coli, splenic flexure polyp biopsy with acute complaint of benign colon polyp. Recommended ten-year followup  . CORONARY STENT PLACEMENT  04/11/2006   2 -- Taxus stents to the circumflex   . IR FLUORO GUIDE CV LINE RIGHT  01/07/2017  . IR  FLUORO GUIDE CV LINE RIGHT  01/16/2017  . IR US GUIDE VASC ACCESS RIGHT  01/07/2017  . IR US GUIDE VASC ACCESS RIGHT  01/16/2017  . PERIPHERAL VASCULAR CATHETERIZATION Right 07/09/2015   Procedure: Upper Extremity Angiography;  Surgeon: Conrad Furman, MD;  Location: [place] [facility];  Service: Cardiovascular;  Laterality: Right;  . PERIPHERAL VASCULAR CATHETERIZATION Right 07/10/2015   Procedure: RIGHT SUBCLAVIAN ARTERY THROMBECTOMY;  Surgeon: Serafina Mitchell, MD;  Location: [facility] OR;  Service: Vascular;  Laterality: Right;  .  Tendonitis     bilateral elbow  . TRIGGER FINGER RELEASE      Family History  Problem Relation Age of Onset  . Diabetes Father   . Hypertension Father   . Stroke Father   . Hypertension Brother   . Aneurysm Brother   . Diabetes Brother   . Colon cancer Neg Hx     Social History:  reports that  has never smoked. she has never used smokeless tobacco. She reports that she does not drink alcohol or use drugs.  Allergies:  Allergies  Allergen Reactions  . Ace Inhibitors Cough  . Amlodipine Swelling    UNSPECIFIED EDEMA   . Codeine Rash    Medications: I have reviewed the patient's current medications.  Results for orders placed or performed during the hospital encounter of 02/10/17 (from the past 48 hour(s))  POC occult blood, ED Provider will collect     Status: None   Collection Time: 02/10/17 12:26 PM  Result Value Ref Range   Fecal Occult Bld NEGATIVE NEGATIVE  CBC with Differential     Status: Abnormal   Collection Time: 02/10/17 12:43 PM  Result Value Ref Range   WBC 7.2 4.0 - 10.5 K/uL   RBC 3.76 (L) 3.87 - 5.11 MIL/uL   Hemoglobin 10.5 (L) 12.0 - 15.0 g/dL   HCT 36.1 36.0 - 46.0 %   MCV 96.0 78.0 - 100.0 fL   MCH 27.9 26.0 - 34.0 pg   MCHC 29.1 (L) 30.0 - 36.0 g/dL   RDW 20.8 (H) 11.5 - 15.5 %   Platelets 174 150 - 400 K/uL   Neutrophils Relative % 88 %   Neutro Abs 6.3 1.7 - 7.7 K/uL   Lymphocytes Relative 7 %   Lymphs Abs 0.5 (L) 0.7 - 4.0 K/uL   Monocytes Relative 5 %   Monocytes Absolute 0.3 0.1 - 1.0 K/uL   Eosinophils Relative 0 %   Eosinophils Absolute 0.0 0.0 - 0.7 K/uL   Basophils Relative 0 %   Basophils Absolute 0.0 0.0 - 0.1 K/uL  Comprehensive metabolic panel     Status: Abnormal   Collection Time: 02/10/17 12:43 PM  Result Value Ref Range   Sodium 131 (L) 135 - 145 mmol/L   Potassium 4.6 3.5 - 5.1 mmol/L   Chloride 91 (L) 101 - 111 mmol/L   CO2 28 22 - 32 mmol/L   Glucose, Bld 401 (H) 65 - 99 mg/dL   BUN 33 (H) 6 - 20 mg/dL    Creatinine, Ser 4.06 (H) 0.44 - 1.00 mg/dL   Calcium 8.9 8.9 - 10.3 mg/dL   Total Protein 7.2 6.5 - 8.1 g/dL   Albumin 2.8 (L) 3.5 - 5.0 g/dL   AST 112 (H) 15 - 41 U/L   ALT 60 (H) 14 - 54 U/L   Alkaline Phosphatase 227 (H) 38 - 126 U/L   Total Bilirubin 0.7 0.3 - 1.2 mg/dL   GFR calc non Af  Amer 10 (L) >60 mL/min   GFR calc Af Amer 12 (L) >60 mL/min    Comment: (NOTE) The eGFR has been calculated using the CKD EPI equation. This calculation has not been validated in all clinical situations. eGFR's persistently <60 mL/min signify possible Chronic Kidney Disease.    Anion gap 12 5 - 15  Troponin I     Status: Abnormal   Collection Time: 02/10/17 12:43 PM  Result Value Ref Range   Troponin I 1.68 (HH) <0.03 ng/mL    Comment: CRITICAL RESULT CALLED TO, READ BACK BY AND VERIFIED WITH: EASTWOOD,J AT 1330 BY HUFFINES,S ON 02/10/17.   Protime-INR     Status: Abnormal   Collection Time: 02/10/17  1:45 PM  Result Value Ref Range   Prothrombin Time >90.0 (H) 11.4 - 15.2 seconds   INR >10.00 (HH)     Comment: REPEATED TO VERIFY CRITICAL RESULT CALLED TO, READ BACK BY AND VERIFIED WITH: L ANDREW AT 1425 BY HFLYNT 02/10/17   Troponin I (q 6hr x 3)     Status: Abnormal   Collection Time: 02/10/17  5:52 PM  Result Value Ref Range   Troponin I 1.42 (HH) <0.03 ng/mL    Comment: CRITICAL VALUE NOTED.  VALUE IS CONSISTENT WITH PREVIOUSLY REPORTED AND CALLED VALUE.  CBG monitoring, ED     Status: Abnormal   Collection Time: 02/10/17  6:50 PM  Result Value Ref Range   Glucose-Capillary 274 (H) 65 - 99 mg/dL  Glucose, capillary     Status: Abnormal   Collection Time: 02/10/17  9:34 PM  Result Value Ref Range   Glucose-Capillary 233 (H) 65 - 99 mg/dL   Comment 1 Notify RN    Comment 2 Document in Chart   Troponin I (q 6hr x 3)     Status: Abnormal   Collection Time: 02/10/17 11:04 PM  Result Value Ref Range   Troponin I 1.71 (HH) <0.03 ng/mL    Comment: CRITICAL RESULT CALLED TO, READ  BACK BY AND VERIFIED WITH:  SMITH,J @ 0049 ON 02/10/17 BY JUW   CBC with Differential/Platelet     Status: Abnormal   Collection Time: 02/11/17  4:59 AM  Result Value Ref Range   WBC 6.1 4.0 - 10.5 K/uL   RBC 3.47 (L) 3.87 - 5.11 MIL/uL   Hemoglobin 9.9 (L) 12.0 - 15.0 g/dL   HCT 33.1 (L) 36.0 - 46.0 %   MCV 95.4 78.0 - 100.0 fL   MCH 28.5 26.0 - 34.0 pg   MCHC 29.9 (L) 30.0 - 36.0 g/dL   RDW 20.8 (H) 11.5 - 15.5 %   Platelets 163 150 - 400 K/uL   Neutrophils Relative % 82 %   Neutro Abs 4.9 1.7 - 7.7 K/uL   Lymphocytes Relative 11 %   Lymphs Abs 0.7 0.7 - 4.0 K/uL   Monocytes Relative 7 %   Monocytes Absolute 0.4 0.1 - 1.0 K/uL   Eosinophils Relative 0 %   Eosinophils Absolute 0.0 0.0 - 0.7 K/uL   Basophils Relative 0 %   Basophils Absolute 0.0 0.0 - 0.1 K/uL  Protime-INR     Status: Abnormal   Collection Time: 02/11/17  4:59 AM  Result Value Ref Range   Prothrombin Time >90.0 (H) 11.4 - 15.2 seconds   INR >10.00 (HH)     Comment: REPEATED TO VERIFY CRITICAL RESULT CALLED TO, READ BACK BY AND VERIFIED WITH: JEFFERSON.V @ 0802 ON [id number] BY HENDERSON L.   Basic metabolic panel  Status: Abnormal   Collection Time: 02/11/17  4:59 AM  Result Value Ref Range   Sodium 134 (L) 135 - 145 mmol/L   Potassium 3.6 3.5 - 5.1 mmol/L    Comment: DELTA CHECK NOTED   Chloride 98 (L) 101 - 111 mmol/L   CO2 27 22 - 32 mmol/L   Glucose, Bld 133 (H) 65 - 99 mg/dL   BUN 39 (H) 6 - 20 mg/dL   Creatinine, Ser 4.56 (H) 0.44 - 1.00 mg/dL   Calcium 8.8 (L) 8.9 - 10.3 mg/dL   GFR calc non Af Amer 9 (L) >60 mL/min   GFR calc Af Amer 10 (L) >60 mL/min    Comment: (NOTE) The eGFR has been calculated using the CKD EPI equation. This calculation has not been validated in all clinical situations. eGFR's persistently <60 mL/min signify possible Chronic Kidney Disease.    Anion gap 9 5 - 15  Troponin I (q 6hr x 3)     Status: Abnormal   Collection Time: 02/11/17  4:59 AM  Result Value Ref  Range   Troponin I 1.62 (HH) <0.03 ng/mL    Comment: CRITICAL VALUE NOTED.  VALUE IS CONSISTENT WITH PREVIOUSLY REPORTED AND CALLED VALUE.  Glucose, capillary     Status: None   Collection Time: 02/11/17  7:13 AM  Result Value Ref Range   Glucose-Capillary 95 65 - 99 mg/dL    Dg Chest 2 View  Result Date: 02/10/2017 CLINICAL DATA:  Cough and fatigue EXAM: CHEST  2 VIEW COMPARISON:  January 29, 2017 FINDINGS: There remains airspace consolidation in the right base with small pleural effusions bilaterally. There is cardiomegaly with pulmonary vascularity within normal limits. No adenopathy. Central catheter tip is at the cavoatrial junction. No pneumothorax. No evident bone lesions. IMPRESSION: Stable cardiac prominence with bilateral pleural effusions. Suspect superimposed pneumonia right base. Lungs elsewhere clear. Pulmonary vascularity appears within normal limits. No pneumothorax. Central catheter position unchanged. Electronically Signed   By: Lowella Grip III M.D.   On: 02/10/2017 13:58    Review of Systems  Constitutional: Positive for malaise/fatigue. Negative for chills and fever.  Respiratory: Negative for cough and shortness of breath.   Cardiovascular: Negative for chest pain and orthopnea.  Gastrointestinal: Negative for constipation, diarrhea and vomiting.       Poor appetite and lack of test for food.  Genitourinary: Positive for hematuria.  Neurological: Positive for weakness.   Blood pressure (!) 103/49, pulse 67, temperature (!) 97.5 F (36.4 C), temperature source Oral, resp. rate (!) 23, height 5' 8" (1.727 m), weight 89.2 kg (196 lb 10.4 oz), SpO2 98 %. Physical Exam  Constitutional: No distress.  Neck: No JVD present.  Cardiovascular: Normal rate and regular rhythm.  Respiratory: No respiratory distress. She has no wheezes.  GI: She exhibits no distension. There is no tenderness.  Musculoskeletal: She exhibits no edema.  Neurological: She is alert.     Assessment/Plan: 1] end-stage renal disease: She is status post hemodialysis on Thursday and is due for dialysis today.  Patient complains of poor appetite but no nausea or vomiting.  She has been dialyzed for 4 hours. 2] anemia: Her hemoglobin is 9.9 which is reasonable.  Patient admitted with elevated INR and some nose bleeding. 3] elevated troponin: Patient denies any chest pain.  She has history of ST EMI in 2017 and she is status post stent placement.  Presently being followed by cardiology. 4] type 2 diabetes 5] history of hypertension: Presently her blood  pressure tends to be low. 6] bone and mineral disorder: Her calcium is at range. Plan: 1] we will make arrangement for patient to get dialysis today for 4 hours 2] will use 3K/2.5 calcium bath 3] we will hold heparin during dialysis 4] we will use Epogen 4000 units IV after each dialysis 5] will remove 4-0/9 L if systolic blood pressure remains above 90  , S 02/11/2017, 9:19 AM

## 2017-02-11 NOTE — Progress Notes (Signed)
PROGRESS NOTE                                                                                                                                                                                                             Patient Demographics:    [name], is a 71 y.o. female, DOB - June 05, 1945, TKW:[id number]  Admit date - 02/10/2017   Admitting Physician Louellen Molder, MD  Outpatient Primary MD for the patient is [name], MD  LOS - 1  Outpatient Specialists:Nephrology  Chief Complaint  Patient presents with  . Weakness       Brief Narrative   71 year old female with ischemic Cardiomyopathy, coronary artery disease with history of STEMI in 2017 stent placement, ESRD on dialysis (recently started, tu, th, sat  Schedule), Hypertension, hyperlipidemia, type 2 diabetes mellitus, A. Fib on anticoagulation presented with generalized weakness for past few weeks. She was hospitalized 2 weeks back for hypoglycemia secondary to poor by mouth intake and her insulin was stopped. Her INR also has been supratherapeutic for last several days and Her Coumadin was stopped about 10 days back When INR was checked during dialysis. Patient recently lost her husband and has been feeling low with poor appetite and generalized weakness. Her last dialysis session was also incomplete as patient reached late for her session.Despite stopping the Coumadin her INR Did not return to normal. She had her INR checked on 12/7 and was found to be >10. Patient was sent to the ED. Vitals in the ED was stable but had elevated troponin 1.68.Cardiology was consulted who recommended this was On likely due to ACS.Patient admitted for further management. She reports feeling weak and lightheaded. Denies any fever, chills, headache, blurred vision, nausea, vomiting, chest pain, palpitations, orthopnea, PND, abdominal pain, dysuria (Makes little urine) or diarrhea.  Denies any muscle or joint aches. Patient admitted for further management.   Subjective:   Patient continues to feel weak. Denies any chest pain, palpitations or shortness of breath.   Assessment  & Plan :    Principal Problem:   Supratherapeutic INR No signs of bleeding. Unclear why it remains supratherapeutic despite patient not taking Coumadin for several days. Received vitamin K on admission without much improvement. Order 2 units FFP with dialysis. Monitor subsequent INR.Monitor for bleeding.  Active Problems: Elevated troponin Possibly associated with ESRD. No  EKG changes or chest pain symptoms.Troponin peaked at 1.71. Cardiology consult appreciated. 2-D echo from 3 months back with EF of 35 and 35% and grade 2 diastolic dysfunction. Also showed mid inferior and basal mid inferior lateral hypokinesis.  ESRD on hemodialysis Last dialysis on 12/6 with incomplete session. Getting dialyzed today. Nephrology consult appreciated. Resume home medications.   Coronary artery disease Continue Plavix and statin. Monitor for bleeding.    Hyperglycemia With type 2 diabetes mellitus Blood glucose 400 in the ED. Received insulin and currently stable. Monitor on sliding Scale coverage. Recent hospitalization for hypoglycemia.      Type 2 diabetes mellitus with stage 4 chronic kidney disease, with long-term current use of insulin ([place]) Patient hyperglycemic in the ED. Resume home dose Lantus and sliding scale coverage.  Ischemic Cardiomyopathy No signs of volume overload. Management with dialysis.    Sacral decubitus ulcer , Leg ulcers Wound care consult.Follows with podiatry as outpatient.     Failure to thrive in adult Has poor by mouth intake. Nutrition consult appreciated and added supplement. PT evaluation.      Code Status :Full code  Family Communication  :None at bedside  Disposition Plan  : Home once clinically improved And INR therapeutic, possibly in 1-2  days  Barriers For Discharge : Active symptoms  Consults  :  Nephrology, cardiology  Procedures  : None  DVT Prophylaxis  :  Supratherapeutic INR  Lab Results  Component Value Date   PLT 163 02/11/2017    Antibiotics  :    Anti-infectives (From admission, onward)   None        Objective:   Vitals:   02/11/17 1400 02/11/17 1430 02/11/17 1500 02/11/17 1530  BP: (!) 108/50 (!) 108/54 (!) 109/49 (!) 90/43  Pulse:      Resp:      Temp:      TempSrc:      SpO2:      Weight:      Height:        Wt Readings from Last 3 Encounters:  02/11/17 89 kg (196 lb 3.4 oz)  02/02/17 91.9 kg (202 lb 9.6 oz)  01/17/17 83.3 kg (183 lb 10.3 oz)     Intake/Output Summary (Last 24 hours) at 02/11/2017 1543 Last data filed at 02/11/2017 1211 Gross per 24 hour  Intake 123 ml  Output -  Net 123 ml     Physical Exam  Gen: not in distress, Fatigued,  flat affect HEENT: no pallor, moist mucosa, supple neck Chest: clear b/l, no added sounds CVS: N S1&S2, no murmurs, rubs or gallop GI: soft, NT, ND, BS+ Musculoskeletal: warm, no edema, Bilateral leg ulcers     Data Review:    CBC Recent Labs  Lab 02/10/17 1243 02/11/17 0459  WBC 7.2 6.1  HGB 10.5* 9.9*  HCT 36.1 33.1*  PLT 174 163  MCV 96.0 95.4  MCH 27.9 28.5  MCHC 29.1* 29.9*  RDW 20.8* 20.8*  LYMPHSABS 0.5* 0.7  MONOABS 0.3 0.4  EOSABS 0.0 0.0  BASOSABS 0.0 0.0    Chemistries  Recent Labs  Lab 02/10/17 1243 02/11/17 0459  NA 131* 134*  K 4.6 3.6  CL 91* 98*  CO2 28 27  GLUCOSE 401* 133*  BUN 33* 39*  CREATININE 4.06* 4.56*  CALCIUM 8.9 8.8*  AST 112*  --   ALT 60*  --   ALKPHOS 227*  --   BILITOT 0.7  --    ------------------------------------------------------------------------------------------------------------------ No results for  input(s): CHOL, HDL, LDLCALC, TRIG, CHOLHDL, LDLDIRECT in the last 72 hours.  Lab Results  Component Value Date   HGBA1C 8.8 (H) 10/31/2016    ------------------------------------------------------------------------------------------------------------------ No results for input(s): TSH, T4TOTAL, T3FREE, THYROIDAB in the last 72 hours.  Invalid input(s): FREET3 ------------------------------------------------------------------------------------------------------------------ No results for input(s): VITAMINB12, FOLATE, FERRITIN, TIBC, IRON, RETICCTPCT in the last 72 hours.  Coagulation profile Recent Labs  Lab 02/06/17 02/10/17 1345 02/11/17 0459  INR >8.0 >10.00* >10.00*    No results for input(s): DDIMER in the last 72 hours.  Cardiac Enzymes Recent Labs  Lab 02/10/17 1752 02/10/17 2304 02/11/17 0459  TROPONINI 1.42* 1.71* 1.62*   ------------------------------------------------------------------------------------------------------------------    Component Value Date/Time   BNP 2,761.4 (H) 12/23/2016 1855    Inpatient Medications  Scheduled Meds: . epoetin alfa      . ALPRAZolam  0.5 mg Oral QHS  . amiodarone  200 mg Oral Daily  . calcitRIOL  0.5 mcg Oral Daily  . clopidogrel  75 mg Oral Daily  . epoetin (EPOGEN/PROCRIT) injection  4,000 Units Intravenous Q T,Th,Sa-HD  . feeding supplement (NEPRO CARB STEADY)  237 mL Oral BID BM  . feeding supplement (PRO-STAT SUGAR FREE 64)  30 mL Oral BID  . ferrous sulfate  325 mg Oral Q breakfast  . fluticasone  1 spray Each Nare Daily  . insulin aspart  0-15 Units Subcutaneous TID WC  . insulin glargine  30 Units Subcutaneous Q2200  . isosorbide mononitrate  30 mg Oral BID  . latanoprost  1 drop Both Eyes QHS  . loratadine  10 mg Oral Daily  . meclizine  12.5 mg Oral BID  . pantoprazole  40 mg Oral Daily  . rosuvastatin  40 mg Oral QHS  . sodium chloride flush  3 mL Intravenous Q12H  . vitamin A & D   Topical QID   Continuous Infusions: . sodium chloride    . sodium chloride    . sodium chloride     PRN Meds:.sodium chloride, sodium chloride,  acetaminophen **OR** acetaminophen, albuterol, alteplase, cyclobenzaprine, heparin, lidocaine (PF), lidocaine-prilocaine, ondansetron **OR** ondansetron (ZOFRAN) IV, pentafluoroprop-tetrafluoroeth  Micro Results No results found for this or any previous visit (from the past 240 hour(s)).  Radiology Reports Dg Chest 2 View  Result Date: 02/10/2017 CLINICAL DATA:  Cough and fatigue EXAM: CHEST  2 VIEW COMPARISON:  January 29, 2017 FINDINGS: There remains airspace consolidation in the right base with small pleural effusions bilaterally. There is cardiomegaly with pulmonary vascularity within normal limits. No adenopathy. Central catheter tip is at the cavoatrial junction. No pneumothorax. No evident bone lesions. IMPRESSION: Stable cardiac prominence with bilateral pleural effusions. Suspect superimposed pneumonia right base. Lungs elsewhere clear. Pulmonary vascularity appears within normal limits. No pneumothorax. Central catheter position unchanged. Electronically Signed   By: Lowella Grip III M.D.   On: 02/10/2017 13:58   Ir Fluoro Guide Cv Line Right  Result Date: 01/16/2017 CLINICAL DATA:  End-stage renal disease with current non tunneled temporary hemodialysis catheter. A tunneled hemodialysis catheter is now needed for longer-term use. EXAM: TUNNELED CENTRAL VENOUS HEMODIALYSIS CATHETER PLACEMENT WITH ULTRASOUND AND FLUOROSCOPIC GUIDANCE ANESTHESIA/SEDATION: 2.0 mg IV Versed; 75 mcg IV Fentanyl. Total Moderate Sedation Time:  24  minutes. The patient's level of consciousness and physiologic status were continuously monitored during the procedure by Radiology nursing. MEDICATIONS: 2 g IV Ancef. As antibiotic prophylaxis, Ancef was ordered pre-procedure and administered intravenously within one hour of incision. FLUOROSCOPY TIME:  1 minutes and 12 seconds.  4.0  mGy. PROCEDURE: The procedure, risks, benefits, and alternatives were explained to the patient. Questions regarding the procedure were  encouraged and answered. The patient understands and consents to the procedure. A timeout was performed prior to initiating the procedure. The right neck and chest were prepped with chlorhexidine in a sterile fashion, and a sterile drape was applied covering the operative field. Maximum barrier sterile technique with sterile gowns and gloves were used for the procedure. Local anesthesia was provided with 1% lidocaine. After creating a small venotomy incision, a 21 gauge needle was advanced into the right internal jugular vein under direct, real-time ultrasound guidance. Ultrasound image documentation was performed. After securing guidewire access, an 8 Fr dilator was placed. A J-wire was kinked to measure appropriate catheter length. A Palindrome tunneled hemodialysis catheter measuring 23 cm from tip to cuff was chosen for placement. This was tunneled in a retrograde fashion from the chest wall to the venotomy incision. At the venotomy, serial dilatation was performed and a 16 Fr peel-away sheath was placed over a guidewire. The catheter was then placed through the sheath and the sheath removed. Final catheter positioning was confirmed and documented with a fluoroscopic spot image. The catheter was aspirated, flushed with saline, and injected with appropriate volume heparin dwells. The venotomy incision was closed with subcutaneous subcuticular 4-0 Vicryl. Dermabond was applied to the incision. The catheter exit site was secured with 0-Prolene retention sutures. COMPLICATIONS: None.  No pneumothorax. FINDINGS: After catheter placement, the tip lies in the right atrium. The catheter aspirates normally and is ready for immediate use. IMPRESSION: Placement of tunneled hemodialysis catheter via the right internal jugular vein. The catheter tip lies in the right atrium. The catheter is ready for immediate use. Electronically Signed   By: Aletta Edouard M.D.   On: 01/16/2017 15:41   Ir US Guide Vasc Access  Right  Result Date: 01/16/2017 CLINICAL DATA:  End-stage renal disease with current non tunneled temporary hemodialysis catheter. A tunneled hemodialysis catheter is now needed for longer-term use. EXAM: TUNNELED CENTRAL VENOUS HEMODIALYSIS CATHETER PLACEMENT WITH ULTRASOUND AND FLUOROSCOPIC GUIDANCE ANESTHESIA/SEDATION: 2.0 mg IV Versed; 75 mcg IV Fentanyl. Total Moderate Sedation Time:  24  minutes. The patient's level of consciousness and physiologic status were continuously monitored during the procedure by Radiology nursing. MEDICATIONS: 2 g IV Ancef. As antibiotic prophylaxis, Ancef was ordered pre-procedure and administered intravenously within one hour of incision. FLUOROSCOPY TIME:  1 minutes and 12 seconds.  4.0 mGy. PROCEDURE: The procedure, risks, benefits, and alternatives were explained to the patient. Questions regarding the procedure were encouraged and answered. The patient understands and consents to the procedure. A timeout was performed prior to initiating the procedure. The right neck and chest were prepped with chlorhexidine in a sterile fashion, and a sterile drape was applied covering the operative field. Maximum barrier sterile technique with sterile gowns and gloves were used for the procedure. Local anesthesia was provided with 1% lidocaine. After creating a small venotomy incision, a 21 gauge needle was advanced into the right internal jugular vein under direct, real-time ultrasound guidance. Ultrasound image documentation was performed. After securing guidewire access, an 8 Fr dilator was placed. A J-wire was kinked to measure appropriate catheter length. A Palindrome tunneled hemodialysis catheter measuring 23 cm from tip to cuff was chosen for placement. This was tunneled in a retrograde fashion from the chest wall to the venotomy incision. At the venotomy, serial dilatation was performed and a 16 Fr peel-away sheath was placed over a guidewire.  The catheter was then placed through  the sheath and the sheath removed. Final catheter positioning was confirmed and documented with a fluoroscopic spot image. The catheter was aspirated, flushed with saline, and injected with appropriate volume heparin dwells. The venotomy incision was closed with subcutaneous subcuticular 4-0 Vicryl. Dermabond was applied to the incision. The catheter exit site was secured with 0-Prolene retention sutures. COMPLICATIONS: None.  No pneumothorax. FINDINGS: After catheter placement, the tip lies in the right atrium. The catheter aspirates normally and is ready for immediate use. IMPRESSION: Placement of tunneled hemodialysis catheter via the right internal jugular vein. The catheter tip lies in the right atrium. The catheter is ready for immediate use. Electronically Signed   By: Aletta Edouard M.D.   On: 01/16/2017 15:41   Dg Chest Port 1 View  Result Date: 01/29/2017 CLINICAL DATA:  Increasing weakness after dialysis this afternoon. EXAM: PORTABLE CHEST 1 VIEW COMPARISON:  12/26/2016 FINDINGS: Interval placement of a right central venous catheter with tip over the cavoatrial junction region. No pneumothorax. Cardiac enlargement with pulmonary vascular congestion. Bilateral pleural effusions with basilar atelectasis. No significant change since previous study. IMPRESSION: Cardiac enlargement, pulmonary vascular congestion, bilateral pleural effusions, and basilar atelectasis similar to previous study. Electronically Signed   By: Lucienne Capers M.D.   On: 01/29/2017 01:09    Time Spent in minutes  25   Demari Gales M.D on 02/11/2017 at 3:43 PM  Between 7am to 7pm - Pager - [phone]  After 7pm go to [email or web address] - password [email or web address]  [facility] -  Office  [phone]

## 2017-02-11 NOTE — Progress Notes (Signed)
CRITICAL VALUE ALERT  Critical Value:  Troponin 1.71  Date & Time Notied:  02/11/2017  0050  Provider Notified: M. Lynch   Orders Received/Actions taken: No new orders. Cardiology following.  Will continue to monitor.

## 2017-02-12 ENCOUNTER — Inpatient Hospital Stay (HOSPITAL_COMMUNITY): Payer: BLUE CROSS/BLUE SHIELD

## 2017-02-12 DIAGNOSIS — M25562 Pain in left knee: Secondary | ICD-10-CM

## 2017-02-12 LAB — GLUCOSE, CAPILLARY
GLUCOSE-CAPILLARY: 135 mg/dL — AB (ref 65–99)
GLUCOSE-CAPILLARY: 262 mg/dL — AB (ref 65–99)
Glucose-Capillary: 94 mg/dL (ref 65–99)
Glucose-Capillary: 122 mg/dL — ABNORMAL HIGH (ref 65–99)

## 2017-02-12 LAB — CBC
HCT: 33.9 % — ABNORMAL LOW (ref 36.0–46.0)
Hemoglobin: 10 g/dL — ABNORMAL LOW (ref 12.0–15.0)
MCH: 28.4 pg (ref 26.0–34.0)
MCHC: 29.5 g/dL — ABNORMAL LOW (ref 30.0–36.0)
MCV: 96.3 fL (ref 78.0–100.0)
PLATELETS: 145 10*3/uL — AB (ref 150–400)
RBC: 3.52 MIL/uL — ABNORMAL LOW (ref 3.87–5.11)
RDW: 21.3 % — AB (ref 11.5–15.5)
WBC: 6.5 10*3/uL (ref 4.0–10.5)

## 2017-02-12 LAB — HEPATITIS B SURFACE ANTIGEN: Hepatitis B Surface Ag: NEGATIVE

## 2017-02-12 LAB — RENAL FUNCTION PANEL
Albumin: 2.7 g/dL — ABNORMAL LOW (ref 3.5–5.0)
Anion gap: 11 (ref 5–15)
BUN: 30 mg/dL — AB (ref 6–20)
CHLORIDE: 95 mmol/L — AB (ref 101–111)
CO2: 28 mmol/L (ref 22–32)
CREATININE: 3.89 mg/dL — AB (ref 0.44–1.00)
Calcium: 8.4 mg/dL — ABNORMAL LOW (ref 8.9–10.3)
GFR calc Af Amer: 12 mL/min — ABNORMAL LOW (ref >60)
GFR, EST NON AFRICAN AMERICAN: 11 mL/min — AB (ref >60)
Glucose, Bld: 121 mg/dL — ABNORMAL HIGH (ref 65–99)
Phosphorus: 2.8 mg/dL (ref 2.5–4.6)
Potassium: 4.7 mmol/L (ref 3.5–5.1)
SODIUM: 134 mmol/L — AB (ref 135–145)

## 2017-02-12 LAB — PROTIME-INR
INR: 4.72 — AB
PROTHROMBIN TIME: 48.7 s — AB (ref 11.4–15.2)

## 2017-02-12 LAB — BPAM FFP
BLOOD PRODUCT EXPIRATION DATE: 201812082359
Blood Product Expiration Date: 201812132359
ISSUE DATE / TIME: 201812081512
ISSUE DATE / TIME: 201812081608
UNIT TYPE AND RH: 1700
UNIT TYPE AND RH: 7300

## 2017-02-12 LAB — PREPARE FRESH FROZEN PLASMA
UNIT DIVISION: 0
Unit division: 0

## 2017-02-12 LAB — TROPONIN I
TROPONIN I: 1.22 ng/mL — AB (ref <0.03)
Troponin I: 1.09 ng/mL (ref <0.03)

## 2017-02-12 MED ORDER — SENNOSIDES-DOCUSATE SODIUM 8.6-50 MG PO TABS
2.0000 | ORAL_TABLET | Freq: Every day | ORAL | Status: DC | PRN
  Administered 2017-02-12: 2 via ORAL
  Filled 2017-02-12: qty 2

## 2017-02-12 MED ORDER — PREDNISONE 20 MG PO TABS
40.0000 mg | ORAL_TABLET | Freq: Every day | ORAL | Status: DC
  Administered 2017-02-12 – 2017-02-17 (×5): 40 mg via ORAL
  Filled 2017-02-12 (×5): qty 2

## 2017-02-12 NOTE — Progress Notes (Signed)
CRITICAL VALUE ALERT  Critical Value:  INR 4.51  Date & Time Notied: 02/11/2017 2055

## 2017-02-12 NOTE — Progress Notes (Signed)
Dr. Clementeen Graham notified of critical INR 4.72

## 2017-02-12 NOTE — Progress Notes (Addendum)
PROGRESS NOTE                                                                                                                                                                                                             Patient Demographics:    [name], is a 71 y.o. female, DOB - 01/29/1946, ASN:[id number]  Admit date - 02/10/2017   Admitting Physician Louellen Molder, MD  Outpatient Primary MD for the patient is [name], MD  LOS - 2  Outpatient Specialists:Nephrology  Chief Complaint  Patient presents with  . Weakness       Brief Narrative   71 year old female with ischemic Cardiomyopathy, coronary artery disease with history of STEMI in 2017 stent placement, ESRD on dialysis (recently started, tu, th, sat  Schedule), Hypertension, hyperlipidemia, type 2 diabetes mellitus, A. Fib on anticoagulation presented with generalized weakness for past few weeks. She was hospitalized 2 weeks back for hypoglycemia secondary to poor by mouth intake and her insulin was stopped. Her INR also has been supratherapeutic for last several days and Her Coumadin was stopped about 10 days back When INR was checked during dialysis. Patient recently lost her husband and has been feeling low with poor appetite and generalized weakness. Her last dialysis session was also incomplete as patient reached late for her session.Despite stopping the Coumadin her INR Did not return to normal. She had her INR checked on 12/7 and was found to be >10. Patient was sent to the ED. Vitals in the ED was stable but had elevated troponin 1.68.Cardiology was consulted who recommended this was On likely due to ACS.Patient admitted for further management. She reports feeling weak and lightheaded. Denies any fever, chills, headache, blurred vision, nausea, vomiting, chest pain, palpitations, orthopnea, PND, abdominal pain, dysuria (Makes little urine) or diarrhea.  Denies any muscle or joint aches. Patient admitted for further management.   Subjective:   Still weak but feels better. C/o right elbow and left knee pain. Received HD yesterday .   Assessment  & Plan :    Principal Problem:   Supratherapeutic INR No signs of bleeding. Unclear why it remains supratherapeutic despite patient not taking Coumadin for several days. No improvement with vitamin K given on admission.  Received 2 units FFP with dialysis ,  INR improved to 4.72. hold off on further FFP or  vitamin K and monitor. Coumadin remains on hold. H&H stable.  Active Problems: Elevated troponin Possibly associated with ESRD. No EKG changes or chest pain symptoms.Troponin peaked at 1.71. Cardiology consult appreciated. 2-D echo from 3 months back with EF of 35 and 35% and grade 2 diastolic dysfunction. Also showed mid inferior and basal mid inferior lateral hypokinesis. Continue current home medications.  ESRD on hemodialysis Incompletely dialysis on 12/6. Received HD on 12/8. Continue home medications. Nephrology following.   Coronary artery disease Continue Plavix and statin. Monitor for bleeding.    Hyperglycemia With type 2 diabetes mellitus Blood glucose 400 in the ED. Received insulin and currently stable. Monitor on sliding Scale coverage. Recent hospitalization for hypoglycemia.    Ischemic Cardiomyopathy No signs of volume overload. Management with dialysis.    Sacral decubitus ulcer stage II, Leg ulcer Care per nursing, frequent repositioning in bed. Has left shin split , no bleeding or infection, clean eschar over left heel, small deep tissue injury over rt heel  Right elbow and left knee pain. Suspect acute gouty arthritis. Xray of elbow and knee unremarkable for fluid collection or injury. Add prednisone.    Failure to thrive in adult Has poor by mouth intake. Nutrition consult appreciated and added supplement. PT evaluation.  Depression After losing her  husband recently 27 years. Offered antidepressants but patient refused.   Transferred to telemetry if Stable this afternoon. Code Status :Full code  Family Communication  :None at bedside  Disposition Plan  : Home versus SNF Possibly in the next 24-48 hours  Barriers For Discharge : Active symptoms  Consults  :  Nephrology, cardiology  Procedures  : None  DVT Prophylaxis  :  Supratherapeutic INR  Lab Results  Component Value Date   PLT 145 (L) 02/12/2017    Antibiotics  :    Anti-infectives (From admission, onward)   None        Objective:   Vitals:   02/12/17 0200 02/12/17 0400 02/12/17 0500 02/12/17 0830  BP: 118/65     Pulse: 68     Resp: 17     Temp:  98.4 F (36.9 C)  98.3 F (36.8 C)  TempSrc:  Oral  Oral  SpO2: 95%     Weight:   88.8 kg (195 lb 12.3 oz)   Height:        Wt Readings from Last 3 Encounters:  02/12/17 88.8 kg (195 lb 12.3 oz)  02/02/17 91.9 kg (202 lb 9.6 oz)  01/17/17 83.3 kg (183 lb 10.3 oz)     Intake/Output Summary (Last 24 hours) at 02/12/2017 1039 Last data filed at 02/12/2017 1016 Gross per 24 hour  Intake 6 ml  Output 1500 ml  Net -1494 ml     Physical Exam Gen.: Not in distress, flat affect and fatigue HEENT: No pallor,Moist mucosa, supple neck Chest:Clear bilaterally CVS: Normal S1 and S2, no murmurs GI: Soft, nondistended, nontender Muscle suture: Warm, no edema, bilateral leg And foot ulcers. Warm and swelling of the left knee with tenderness. Tenderness of the right elbow without warmth or erythema or swelling.     Data Review:    CBC Recent Labs  Lab 02/10/17 1243 02/11/17 0459 02/11/17 2055 02/12/17 0414  WBC 7.2 6.1 6.9 6.5  HGB 10.5* 9.9* 10.0* 10.0*  HCT 36.1 33.1* 33.5* 33.9*  PLT 174 163 121* 145*  MCV 96.0 95.4 95.2 96.3  MCH 27.9 28.5 28.4 28.4  MCHC 29.1* 29.9* 29.9* 29.5*  RDW 20.8* 20.8*  20.8* 21.3*  LYMPHSABS 0.5* 0.7  --   --   MONOABS 0.3 0.4  --   --   EOSABS 0.0 0.0  --   --     BASOSABS 0.0 0.0  --   --     Chemistries  Recent Labs  Lab 02/10/17 1243 02/11/17 0459 02/11/17 1500 02/12/17 0414  NA 131* 134* 133* 134*  K 4.6 3.6 3.8 4.7  CL 91* 98* 95* 95*  CO2 28 27 26 28   GLUCOSE 401* 133* 273* 121*  BUN 33* 39* 43* 30*  CREATININE 4.06* 4.56* 4.83* 3.89*  CALCIUM 8.9 8.8* 8.7* 8.4*  AST 112*  --   --   --   ALT 60*  --   --   --   ALKPHOS 227*  --   --   --   BILITOT 0.7  --   --   --    ------------------------------------------------------------------------------------------------------------------ No results for input(s): CHOL, HDL, LDLCALC, TRIG, CHOLHDL, LDLDIRECT in the last 72 hours.  Lab Results  Component Value Date   HGBA1C 8.8 (H) 10/31/2016   ------------------------------------------------------------------------------------------------------------------ No results for input(s): TSH, T4TOTAL, T3FREE, THYROIDAB in the last 72 hours.  Invalid input(s): FREET3 ------------------------------------------------------------------------------------------------------------------ No results for input(s): VITAMINB12, FOLATE, FERRITIN, TIBC, IRON, RETICCTPCT in the last 72 hours.  Coagulation profile Recent Labs  Lab 02/06/17 02/10/17 1345 02/11/17 0459 02/11/17 2055 02/12/17 0414  INR >8.0 >10.00* >10.00* 4.51* 4.72*    No results for input(s): DDIMER in the last 72 hours.  Cardiac Enzymes Recent Labs  Lab 02/11/17 1500 02/11/17 2055 02/12/17 0414  TROPONINI 1.34* 1.09* 1.09*   ------------------------------------------------------------------------------------------------------------------    Component Value Date/Time   BNP 2,761.4 (H) 12/23/2016 1855    Inpatient Medications  Scheduled Meds: . ALPRAZolam  0.5 mg Oral QHS  . amiodarone  200 mg Oral Daily  . calcitRIOL  0.5 mcg Oral Daily  . clopidogrel  75 mg Oral Daily  . epoetin (EPOGEN/PROCRIT) injection  4,000 Units Intravenous Q T,Th,Sa-HD  . feeding  supplement (NEPRO CARB STEADY)  237 mL Oral BID BM  . feeding supplement (PRO-STAT SUGAR FREE 64)  30 mL Oral BID  . ferrous sulfate  325 mg Oral Q breakfast  . fluticasone  1 spray Each Nare Daily  . insulin aspart  0-15 Units Subcutaneous TID WC  . insulin glargine  30 Units Subcutaneous Q2200  . isosorbide mononitrate  30 mg Oral BID  . latanoprost  1 drop Both Eyes QHS  . loratadine  10 mg Oral Daily  . meclizine  12.5 mg Oral BID  . pantoprazole  40 mg Oral Daily  . rosuvastatin  40 mg Oral QHS  . sodium chloride flush  3 mL Intravenous Q12H  . vitamin A & D   Topical QID   Continuous Infusions: . sodium chloride    . sodium chloride    . sodium chloride     PRN Meds:.sodium chloride, sodium chloride, acetaminophen **OR** acetaminophen, albuterol, alteplase, cyclobenzaprine, heparin, lidocaine (PF), lidocaine-prilocaine, ondansetron **OR** ondansetron (ZOFRAN) IV, pentafluoroprop-tetrafluoroeth  Micro Results No results found for this or any previous visit (from the past 240 hour(s)).  Radiology Reports Dg Chest 2 View  Result Date: 02/10/2017 CLINICAL DATA:  Cough and fatigue EXAM: CHEST  2 VIEW COMPARISON:  January 29, 2017 FINDINGS: There remains airspace consolidation in the right base with small pleural effusions bilaterally. There is cardiomegaly with pulmonary vascularity within normal limits. No adenopathy. Central catheter tip is at the cavoatrial junction.  No pneumothorax. No evident bone lesions. IMPRESSION: Stable cardiac prominence with bilateral pleural effusions. Suspect superimposed pneumonia right base. Lungs elsewhere clear. Pulmonary vascularity appears within normal limits. No pneumothorax. Central catheter position unchanged. Electronically Signed   By: Lowella Grip III M.D.   On: 02/10/2017 13:58   Dg Elbow 2 Views Right  Result Date: 02/12/2017 CLINICAL DATA:  71 year old female with right elbow pain. EXAM: RIGHT ELBOW - 2 VIEW COMPARISON:  03/04/2004  FINDINGS: Please note, this is a limited evaluation due to suboptimal positioning. There is no evidence of fracture, dislocation, or joint effusion. There is no evidence of arthropathy or other focal bone abnormality. Soft tissues are unremarkable. IMPRESSION: Limited study due to suboptimal positioning. No gross osseous or soft tissue abnormalities. Electronically Signed   By: Kristopher Oppenheim M.D.   On: 02/12/2017 09:32   Dg Knee 1-2 Views Left  Result Date: 02/12/2017 CLINICAL DATA:  71 year old female with left knee pain. EXAM: LEFT KNEE - 1-2 VIEW COMPARISON:  None. FINDINGS: No evidence of fracture, dislocation, or joint effusion. No focal bone abnormality. There are mild degenerative changes with chondrocalcinosis noted. Soft tissues are unremarkable. IMPRESSION: No acute osseous abnormality. Electronically Signed   By: Kristopher Oppenheim M.D.   On: 02/12/2017 09:31   Ir Fluoro Guide Cv Line Right  Result Date: 01/16/2017 CLINICAL DATA:  End-stage renal disease with current non tunneled temporary hemodialysis catheter. A tunneled hemodialysis catheter is now needed for longer-term use. EXAM: TUNNELED CENTRAL VENOUS HEMODIALYSIS CATHETER PLACEMENT WITH ULTRASOUND AND FLUOROSCOPIC GUIDANCE ANESTHESIA/SEDATION: 2.0 mg IV Versed; 75 mcg IV Fentanyl. Total Moderate Sedation Time:  24  minutes. The patient's level of consciousness and physiologic status were continuously monitored during the procedure by Radiology nursing. MEDICATIONS: 2 g IV Ancef. As antibiotic prophylaxis, Ancef was ordered pre-procedure and administered intravenously within one hour of incision. FLUOROSCOPY TIME:  1 minutes and 12 seconds.  4.0 mGy. PROCEDURE: The procedure, risks, benefits, and alternatives were explained to the patient. Questions regarding the procedure were encouraged and answered. The patient understands and consents to the procedure. A timeout was performed prior to initiating the procedure. The right neck and chest  were prepped with chlorhexidine in a sterile fashion, and a sterile drape was applied covering the operative field. Maximum barrier sterile technique with sterile gowns and gloves were used for the procedure. Local anesthesia was provided with 1% lidocaine. After creating a small venotomy incision, a 21 gauge needle was advanced into the right internal jugular vein under direct, real-time ultrasound guidance. Ultrasound image documentation was performed. After securing guidewire access, an 8 Fr dilator was placed. A J-wire was kinked to measure appropriate catheter length. A Palindrome tunneled hemodialysis catheter measuring 23 cm from tip to cuff was chosen for placement. This was tunneled in a retrograde fashion from the chest wall to the venotomy incision. At the venotomy, serial dilatation was performed and a 16 Fr peel-away sheath was placed over a guidewire. The catheter was then placed through the sheath and the sheath removed. Final catheter positioning was confirmed and documented with a fluoroscopic spot image. The catheter was aspirated, flushed with saline, and injected with appropriate volume heparin dwells. The venotomy incision was closed with subcutaneous subcuticular 4-0 Vicryl. Dermabond was applied to the incision. The catheter exit site was secured with 0-Prolene retention sutures. COMPLICATIONS: None.  No pneumothorax. FINDINGS: After catheter placement, the tip lies in the right atrium. The catheter aspirates normally and is ready for immediate use. IMPRESSION: Placement of  tunneled hemodialysis catheter via the right internal jugular vein. The catheter tip lies in the right atrium. The catheter is ready for immediate use. Electronically Signed   By: Aletta Edouard M.D.   On: 01/16/2017 15:41   Ir US Guide Vasc Access Right  Result Date: 01/16/2017 CLINICAL DATA:  End-stage renal disease with current non tunneled temporary hemodialysis catheter. A tunneled hemodialysis catheter is now  needed for longer-term use. EXAM: TUNNELED CENTRAL VENOUS HEMODIALYSIS CATHETER PLACEMENT WITH ULTRASOUND AND FLUOROSCOPIC GUIDANCE ANESTHESIA/SEDATION: 2.0 mg IV Versed; 75 mcg IV Fentanyl. Total Moderate Sedation Time:  24  minutes. The patient's level of consciousness and physiologic status were continuously monitored during the procedure by Radiology nursing. MEDICATIONS: 2 g IV Ancef. As antibiotic prophylaxis, Ancef was ordered pre-procedure and administered intravenously within one hour of incision. FLUOROSCOPY TIME:  1 minutes and 12 seconds.  4.0 mGy. PROCEDURE: The procedure, risks, benefits, and alternatives were explained to the patient. Questions regarding the procedure were encouraged and answered. The patient understands and consents to the procedure. A timeout was performed prior to initiating the procedure. The right neck and chest were prepped with chlorhexidine in a sterile fashion, and a sterile drape was applied covering the operative field. Maximum barrier sterile technique with sterile gowns and gloves were used for the procedure. Local anesthesia was provided with 1% lidocaine. After creating a small venotomy incision, a 21 gauge needle was advanced into the right internal jugular vein under direct, real-time ultrasound guidance. Ultrasound image documentation was performed. After securing guidewire access, an 8 Fr dilator was placed. A J-wire was kinked to measure appropriate catheter length. A Palindrome tunneled hemodialysis catheter measuring 23 cm from tip to cuff was chosen for placement. This was tunneled in a retrograde fashion from the chest wall to the venotomy incision. At the venotomy, serial dilatation was performed and a 16 Fr peel-away sheath was placed over a guidewire. The catheter was then placed through the sheath and the sheath removed. Final catheter positioning was confirmed and documented with a fluoroscopic spot image. The catheter was aspirated, flushed with saline,  and injected with appropriate volume heparin dwells. The venotomy incision was closed with subcutaneous subcuticular 4-0 Vicryl. Dermabond was applied to the incision. The catheter exit site was secured with 0-Prolene retention sutures. COMPLICATIONS: None.  No pneumothorax. FINDINGS: After catheter placement, the tip lies in the right atrium. The catheter aspirates normally and is ready for immediate use. IMPRESSION: Placement of tunneled hemodialysis catheter via the right internal jugular vein. The catheter tip lies in the right atrium. The catheter is ready for immediate use. Electronically Signed   By: Aletta Edouard M.D.   On: 01/16/2017 15:41   Dg Chest Port 1 View  Result Date: 01/29/2017 CLINICAL DATA:  Increasing weakness after dialysis this afternoon. EXAM: PORTABLE CHEST 1 VIEW COMPARISON:  12/26/2016 FINDINGS: Interval placement of a right central venous catheter with tip over the cavoatrial junction region. No pneumothorax. Cardiac enlargement with pulmonary vascular congestion. Bilateral pleural effusions with basilar atelectasis. No significant change since previous study. IMPRESSION: Cardiac enlargement, pulmonary vascular congestion, bilateral pleural effusions, and basilar atelectasis similar to previous study. Electronically Signed   By: Lucienne Capers M.D.   On: 01/29/2017 01:09    Time Spent in minutes  25   Aryan Bello M.D on 02/12/2017 at 10:39 AM  Between 7am to 7pm - Pager - [phone]  After 7pm go to [email or web address] - password [email or web address]  [facility] -  Office  [phone]

## 2017-02-13 DIAGNOSIS — L89152 Pressure ulcer of sacral region, stage 2: Secondary | ICD-10-CM

## 2017-02-13 LAB — GLUCOSE, CAPILLARY
GLUCOSE-CAPILLARY: 276 mg/dL — AB (ref 65–99)
Glucose-Capillary: 290 mg/dL — ABNORMAL HIGH (ref 65–99)
Glucose-Capillary: 279 mg/dL — ABNORMAL HIGH (ref 65–99)
Glucose-Capillary: 261 mg/dL — ABNORMAL HIGH (ref 65–99)

## 2017-02-13 LAB — PROTIME-INR
INR: 2.28
PROTHROMBIN TIME: 24.9 s — AB (ref 11.4–15.2)

## 2017-02-13 MED ORDER — WARFARIN - PHARMACIST DOSING INPATIENT: Status: DC

## 2017-02-13 MED ORDER — INSULIN GLARGINE 100 UNIT/ML ~~LOC~~ SOLN
36.0000 [IU] | Freq: Every day | SUBCUTANEOUS | Status: DC
  Administered 2017-02-13: 36 [IU] via SUBCUTANEOUS
  Filled 2017-02-13 (×2): qty 0.36

## 2017-02-13 MED ORDER — WARFARIN SODIUM 5 MG PO TABS
5.0000 mg | ORAL_TABLET | Freq: Once | ORAL | Status: AC
  Administered 2017-02-13: 5 mg via ORAL
  Filled 2017-02-13: qty 1

## 2017-02-13 NOTE — Evaluation (Signed)
Physical Therapy Evaluation Patient Details Name: [name] MRN: [id number] DOB: 05/22/1945 Today's Date: 02/13/2017   History of Present Illness  [name] is a 71 y.o. female with a history of ischemic cardiomyopathy with an STEMI in 2017 with stent placements, end-stage renal disease on dialysis (recent start of dialysis), coronary artery disease, chronic combined systolic and diastolic heart failure, hypertension, GERD, hyperlipidemia, type 2 diabetes.  Patient has been feeling weak and tired over the past few weeks.  She was recently hospitalized at the end of November due to hypoglycemia secondary to insulin use and poor p.o. intake.  Her poor p.o. intake is secondary to her husband passing away last month.  Her insulin was stopped.  Her INR has been elevated over the past several days and, according to the patient, her Coumadin was stopped about a week ago.  Her INR has continued to be elevated and has not returned to normal levels.  Today, she was instructed to go to the radiology office for an INR, which was greater than 10.  Repeat in the emergency department was the same.  Laboratory data shows elevated troponin at 1.68.  Cardiology was consulted, who is doubtful that this is an NSTEMI or ischemic in nature, but applies this is secondary to elevation due to end-stage renal disease.  No palliating factors.  No chest pain    Clinical Impression  Patient is a fall risk, limited for taking steps, standing with RW due to BLE weakness and poor standing balance.  Patient able to take a few side steps to transfer to chair - nursing staff aware.  Patient will benefit from continued physical therapy in hospital and recommended venue below to increase strength, balance, endurance for safe ADLs and gait.    Follow Up Recommendations SNF;Supervision/Assistance - 24 hour    Equipment Recommendations  None recommended by PT    Recommendations for Other Services       Precautions /  Restrictions Precautions Precautions: Fall Restrictions Weight Bearing Restrictions: No      Mobility  Bed Mobility Overal bed mobility: Needs Assistance Bed Mobility: Supine to Sit     Supine to sit: Mod assist;Max assist        Transfers Overall transfer level: Needs assistance Equipment used: Rolling walker (2 wheeled) Transfers: Sit to/from [facility] Sit to Stand: Mod assist;Max assist Stand pivot transfers: Mod assist;Max assist       General transfer comment: difficulty keeping trunk extended secondary to weakness  Ambulation/Gait Ambulation/Gait assistance: Max assist Ambulation Distance (Feet): 2 Feet Assistive device: Rolling walker (2 wheeled) Gait Pattern/deviations: Decreased step length - right;Decreased step length - left;Decreased stance time - left;Decreased stride length;Decreased stance time - right Gait velocity: decreased   General Gait Details: Patient limited to 3-4 side steps secondary to BLE weakness, poor standing balance  Stairs            Wheelchair Mobility    Modified Rankin (Stroke Patients Only)       Balance Overall balance assessment: Needs assistance Sitting-balance support: Feet supported;Bilateral upper extremity supported Sitting balance-[name]: Fair     Standing balance support: Bilateral upper extremity supported;During functional activity Standing balance-[name]: Poor                               Pertinent Vitals/Pain Pain Assessment: 0-10 Pain Score: 5  Pain Location: left knee Pain Descriptors / Indicators: Aching Pain Intervention(s):  Limited activity within patient's tolerance;Monitored during session    [place] expects to be discharged to:: Private residence Living Arrangements: Children Available Help at Discharge: Family Type of Home: House Home Access: Level entry     [place]: [place]: Environmental consultant - 4 wheels;Shower  seat;Bedside commode;Wheelchair - manual      Prior Function Level of Independence: Independent with assistive device(s)               Hand Dominance        Extremity/Trunk Assessment   Upper Extremity Assessment Upper Extremity Assessment: Generalized weakness    Lower Extremity Assessment Lower Extremity Assessment: Generalized weakness    Cervical / Trunk Assessment Cervical / Trunk Assessment: Normal  Communication   Communication: No difficulties  Cognition Arousal/Alertness: Awake/alert Behavior During Therapy: WFL for tasks assessed/performed Overall Cognitive Status: Within Functional Limits for tasks assessed                                        General Comments      Exercises     Assessment/Plan    PT Assessment Patient needs continued PT services  PT Problem List Decreased strength;Decreased activity tolerance;Decreased balance;Decreased mobility       PT Treatment Interventions Gait training;Functional mobility training;Therapeutic activities;Therapeutic exercise;Patient/family education    PT Goals (Current goals can be found in the Care Plan section)  Acute Rehab PT Goals Patient Stated Goal: Return home with family to assist PT Goal Formulation: With patient Time For Goal Achievement: 02/20/17 Potential to Achieve Goals: Good    Frequency Min 3X/week   Barriers to discharge        Co-evaluation               AM-PAC PT "6 Clicks" Daily Activity  Outcome Measure Difficulty turning over in bed (including adjusting bedclothes, sheets and blankets)?: A Lot Difficulty moving from lying on back to sitting on the side of the bed? : A Lot Difficulty sitting down on and standing up from a chair with arms (e.g., wheelchair, bedside commode, etc,.)?: A Lot Help needed moving to and from a bed to chair (including a wheelchair)?: A Lot Help needed walking in hospital room?: Total Help needed climbing 3-5 steps with a  railing? : Total 6 Click Score: 10    End of Session Equipment Utilized During Treatment: Gait belt Activity Tolerance: Patient limited by fatigue Patient left: in chair;with call bell/phone within reach Nurse Communication: Mobility status PT Visit Diagnosis: Unsteadiness on feet (R26.81);Other abnormalities of gait and mobility (R26.89);Muscle weakness (generalized) (M62.81)    Time: 2947-6546 PT Time Calculation (min) (ACUTE ONLY): 25 min   Charges:   PT Evaluation $PT Eval Moderate Complexity: 1 Mod PT Treatments $Therapeutic Activity: 23-37 mins   PT G Codes:        1:16 PM, 2017/03/15 Lonell Grandchild, MPT Physical Therapist with [facility] [phone] office [phone] mobile phone

## 2017-02-13 NOTE — Progress Notes (Signed)
Inpatient Diabetes Program Recommendations  AACE/ADA: New Consensus Statement on Inpatient Glycemic Control (2015)  Target Ranges:  Prepandial:   less than 140 mg/dL      Peak postprandial:   less than 180 mg/dL (1-2 hours)      Critically ill patients:  140 - 180 mg/dL   Results for [name], [name] (MRN [id number]) as of 02/13/2017 12:34  Ref. Range 02/12/2017 07:15 02/12/2017 11:10 02/12/2017 16:30 02/12/2017 22:41 02/13/2017 07:35 02/13/2017 11:08  Glucose-Capillary Latest Ref Range: 65 - 99 mg/dL 94 122 (H) 135 (H) 262 (H) 276 (H) 290 (H)   Review of Glycemic Control  Diabetes history: DM2 Outpatient Diabetes medications: Lantus 30 units QHS, Humalog 5-11 units TID Current orders for Inpatient glycemic control: Lantus 36 units QHS, Novolog 0-15 units TID with meals; Prednisone 40 mg QAM  Inpatient Diabetes Program Recommendations:  Insulin-Correction: Please consider ordering Novolog 0-5 units QHS for bedtime correction scale. Insulin - Meal Coverage: Please consider ordering Novolog 5 units TID with meals for meal coverage if patient eats at least 50% of meals.  Thanks, Barnie Alderman, RN, MSN, CDE Diabetes Coordinator Inpatient Diabetes Program [phone] (Team Pager from 8am to 5pm)

## 2017-02-13 NOTE — Plan of Care (Signed)
  Acute Rehab PT Goals(only PT should resolve) Pt Will Go Supine/Side To Sit 02/13/2017 1318 - Progressing by Lonell Grandchild, PT Flowsheets Taken 02/13/2017 1318  Pt will go Supine/Side to Sit with minimal assist Pt Will Go Sit To Supine/Side 02/13/2017 1318 - Progressing by Lonell Grandchild, PT Flowsheets Taken 02/13/2017 1318  Pt will go Sit to Supine/Side with minimal assist Patient Will Transfer Sit To/From Stand 02/13/2017 1318 - Progressing by Lonell Grandchild, PT Flowsheets Taken 02/13/2017 1318  Patient will transfer sit to/from stand with moderate assist Pt Will Transfer Bed To Chair/Chair To Bed 02/13/2017 1318 - Progressing by Lonell Grandchild, PT Flowsheets Taken 02/13/2017 1318  Pt will Transfer Bed to Chair/Chair to Bed with mod assist Pt Will Ambulate 02/13/2017 1318 - Progressing by Lonell Grandchild, PT Flowsheets Taken 02/13/2017 1318  Pt will Ambulate 15 feet;with modified independence;with rolling walker   1:20 PM, 02/13/17 Lonell Grandchild, MPT Physical Therapist with [facility] [phone] office [phone] mobile phone

## 2017-02-13 NOTE — Progress Notes (Signed)
ANTICOAGULATION CONSULT NOTE - Initial Consult  Pharmacy Consult for Warfarin Indication: atrial fibrillation  Allergies  Allergen Reactions  . Ace Inhibitors Cough  . Amlodipine Swelling    UNSPECIFIED EDEMA   . Codeine Rash    Patient Measurements: Height: 5\' 8"  (172.7 cm) Weight: 195 lb 12.3 oz (88.8 kg) IBW/kg (Calculated) : 63.9 Heparin Dosing Weight:   Vital Signs: Temp: 97.4 F (36.3 C) (12/10 0800) Temp Source: Oral (12/10 0800) BP: 127/75 (12/10 0600) Pulse Rate: 67 (12/10 0600)  Labs: Recent Labs    02/11/17 0459 02/11/17 1500 02/11/17 2055 02/12/17 0414 02/12/17 1023 02/13/17 0529  HGB 9.9*  --  10.0* 10.0*  --   --   HCT 33.1*  --  33.5* 33.9*  --   --   PLT 163  --  121* 145*  --   --   LABPROT >90.0*  --  42.2* 48.7*  --  24.9*  INR >10.00*  --  4.51* 4.72*  --  2.28  CREATININE 4.56* 4.83*  --  3.89*  --   --   TROPONINI 1.62* 1.34* 1.09* 1.09* 1.22*  --     Estimated Creatinine Clearance: 15.3 mL/min (A) (by C-G formula based on SCr of 3.89 mg/dL (H)).   Medical History: Past Medical History:  Diagnosis Date  . Acute renal failure superimposed on stage 4 chronic kidney disease ([place]) 07/04/2015  . Allergic rhinitis   . Anemia   . Anxiety   . CAD in native artery    NSTEMI 07/2015 s/p DES to RCA and posterior PDA, PCI 10/2015 with scoring balloon to 85% ISR of distal RCA), known LAD/Cx disease treated medically  . Carotid artery disease ([place])    Mild bilateral carotid disease (1-39% 07/2015)  . Chronic combined systolic and diastolic CHF (congestive heart failure) ([place])   . CKD (chronic kidney disease), stage IV ([place])   . Constipation   . Essential hypertension   . GERD (gastroesophageal reflux disease)   . History of hysterectomy   . Hyperlipidemia   . Low back pain   . Obesity   . 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 Osteoarthritis   . Overactive bladder   .  PVC's (premature ventricular contractions)   . Type 2 diabetes mellitus ([facility])    Medications:  Medications Prior to Admission  Medication Sig Dispense Refill Last Dose  . Insulin Glargine (LANTUS) 100 UNIT/ML Solostar Pen Inject 30 Units into the skin daily at 10 pm.     . insulin lispro (HUMALOG) 100 UNIT/ML KiwkPen Inject 5-11 Units into the skin 3 (three) times daily before meals.     Marland Kitchen albuterol (PROVENTIL HFA;VENTOLIN HFA) 108 (90 Base) MCG/ACT inhaler Inhale 1-2 puffs into the lungs every 6 (six) hours as needed for wheezing or shortness of breath. 1 Inhaler 0 unknnown at prn  . albuterol (PROVENTIL) (2.5 MG/3ML) 0.083% nebulizer solution Take 3 mLs (2.5 mg total) by nebulization every 6 (six) hours as needed for wheezing or shortness of breath. 75 mL 1 unknown at prn  . ALPRAZolam (XANAX) 0.5 MG tablet Take 0.5 mg by mouth at bedtime.   12/22/2016  . amiodarone (PACERONE) 200 MG tablet Take 1 tablet (200 mg total) by mouth daily. 30 tablet 0   . calcitRIOL (ROCALTROL) 0.5 MCG capsule Take 1 capsule (0.5 mcg total) daily by mouth. 30 capsule 0   . cetirizine (ZYRTEC) 10 MG tablet Take 5 mg  by mouth daily.  3 12/23/2016  . clopidogrel (PLAVIX) 75 MG tablet Take 1 tablet (75 mg total) by mouth daily. 30 tablet 0 12/23/2016  . cyclobenzaprine (FLEXERIL) 5 MG tablet Take 5 mg by mouth as needed (lower back pain). osteoporosis with out current pathological fracture for 14 days  0 unknown at prn  . ferrous sulfate 325 (65 FE) MG tablet Take 325 mg by mouth daily with breakfast.    12/23/2016  . fluticasone (FLONASE) 50 MCG/ACT nasal spray Place 1 spray into both nostrils daily.   12/23/2016  . isosorbide mononitrate (IMDUR) 30 MG 24 hr tablet Take 1 tablet (30 mg total) 2 (two) times daily by mouth. 60 tablet 0   . latanoprost (XALATAN) 0.005 % ophthalmic solution Place 1 drop into both eyes at bedtime.    12/22/2016  . meclizine (ANTIVERT) 25 MG tablet Take 12.5 mg by mouth 2 (two) times daily.     12/23/2016  . nitroGLYCERIN (NITROSTAT) 0.4 MG SL tablet Place 1 tablet (0.4 mg total) under the tongue every 5 (five) minutes as needed for chest pain. 25 tablet 2 unknown at prn  . pantoprazole (PROTONIX) 40 MG tablet Take 40 mg by mouth daily.    12/23/2016  . rosuvastatin (CRESTOR) 40 MG tablet Take 1 tablet (40 mg total) by mouth at bedtime. 30 tablet 0 12/22/2016   Assessment: Okay for Protocol, No signs of bleeding. INR high on admission despite she being off Coumadin for last 10 days. Now therapeutic after receiving 2 units FFP with dialysis on 12/8. Will resume Coumadin.Previous home dose = 7.5mg  daily.  Goal of Therapy:  INR 2-3   Plan:  Warfarin 5mg  PO x 1 Daily PT/INR Monitor for signs and symptoms of bleeding.   Biagio Quint R 02/13/2017,2:51 PM

## 2017-02-13 NOTE — Progress Notes (Signed)
Subjective: Interval History: has complaints still feeling weak and tired.  Her appetite is not that good.  She did not have any nausea or vomiting..  Objective: Vital signs in last 24 hours: Temp:  [97.4 F (36.3 C)-98.9 F (37.2 C)] 97.4 F (36.3 C) (12/10 0800) Pulse Rate:  [65-74] 67 (12/10 0600) Resp:  [16-26] 17 (12/10 0600) BP: (95-134)/(48-103) 127/75 (12/10 0600) SpO2:  [94 %-98 %] 96 % (12/10 0600) Weight change:   Intake/Output from previous day: 12/09 0701 - 12/10 0700 In: 483 [P.O.:480; I.V.:3] Out: -  Intake/Output this shift: Total I/O In: 3 [I.V.:3] Out: -   General appearance: alert, cooperative and no distress Resp: clear to auscultation bilaterally Cardio: regular rate and rhythm Extremities: No edema  Lab Results: Recent Labs    02/11/17 2055 02/12/17 0414  WBC 6.9 6.5  HGB 10.0* 10.0*  HCT 33.5* 33.9*  PLT 121* 145*   BMET:  Recent Labs    02/11/17 1500 02/12/17 0414  NA 133* 134*  K 3.8 4.7  CL 95* 95*  CO2 26 28  GLUCOSE 273* 121*  BUN 43* 30*  CREATININE 4.83* 3.89*  CALCIUM 8.7* 8.4*   No results for input([name]): PTH in the last 72 hours. Iron Studies: No results for input([name]): IRON, TIBC, TRANSFERRIN, FERRITIN in the last 72 hours.  Studies/Results: Dg Elbow 2 Views Right  Result Date: 02/12/2017 CLINICAL DATA:  71 year old female with right elbow pain. EXAM: RIGHT ELBOW - 2 VIEW COMPARISON:  03/04/2004 FINDINGS: Please note, this is a limited evaluation due to suboptimal positioning. There is no evidence of fracture, dislocation, or joint effusion. There is no evidence of arthropathy or other focal bone abnormality. Soft tissues are unremarkable. IMPRESSION: Limited study due to suboptimal positioning. No gross osseous or soft tissue abnormalities. Electronically Signed   By: Kristopher Oppenheim M.D.   On: 02/12/2017 09:32   Dg Knee 1-2 Views Left  Result Date: 02/12/2017 CLINICAL DATA:  71 year old female with left knee pain. EXAM:  LEFT KNEE - 1-2 VIEW COMPARISON:  None. FINDINGS: No evidence of fracture, dislocation, or joint effusion. No focal bone abnormality. There are mild degenerative changes with chondrocalcinosis noted. Soft tissues are unremarkable. IMPRESSION: No acute osseous abnormality. Electronically Signed   By: Kristopher Oppenheim M.D.   On: 02/12/2017 09:31    I have reviewed the patient'[name] current medications.  Assessment/Plan: 1] end-stage renal disease: She is status post hemodialysis on Saturday.  Patient with poor appetite but no nausea or vomiting.  Her potassium is good.  She is due for dialysis tomorrow 2] anemia: Her hemoglobin is within our target goal. 3] bone and mineral disorder: Her calcium and phosphorus is range 4] hypertension: Her blood pressure is reasonably controlled 5] fluid management: No sign of fluid overload. 6] feeling tired and weak.  At this moment etiology not clear.  Patient has been complaining about the same thing for the last couple of months.  In fact patient states that since she was started on dialysis she does not feel good. Plan: We will make arrangement for patient to get dialysis tomorrow 2] we will dialyze her for 4 hours and possibly remove about 2 L 3] we will check her CBC renal panel in the morning.    LOS: 3 days   [name] 02/13/2017,2:29 PM

## 2017-02-13 NOTE — Progress Notes (Signed)
Hand-off communication given to [place], RN in preparation for transfer to 21.

## 2017-02-13 NOTE — Progress Notes (Signed)
PROGRESS NOTE                                                                                                                                                                                                             Patient Demographics:    [name], is a 71 y.o. female, DOB - 1946/01/28, QQP:[id number]  Admit date - 02/10/2017   Admitting Physician Louellen Molder, MD  Outpatient Primary MD for the patient is [name], MD  LOS - 3  Outpatient Specialists:Nephrology  Chief Complaint  Patient presents with  . Weakness       Brief Narrative   71 year old female with ischemic Cardiomyopathy, coronary artery disease with history of STEMI in 2017 stent placement, ESRD on dialysis (recently started, tu, th, sat  Schedule), Hypertension, hyperlipidemia, type 2 diabetes mellitus, A. Fib on anticoagulation presented with generalized weakness for past few weeks. She was hospitalized 2 weeks back for hypoglycemia secondary to poor by mouth intake and her insulin was stopped. Her INR also has been supratherapeutic for last several days and Her Coumadin was stopped about 10 days back When INR was checked during dialysis. Patient recently lost her husband and has been feeling low with poor appetite and generalized weakness. Her last dialysis session was also incomplete as patient reached late for her session.Despite stopping the Coumadin her INR Did not return to normal. She had her INR checked on 12/7 and was found to be >10. Patient was sent to the ED. Vitals in the ED was stable but had elevated troponin 1.68.Cardiology was consulted who recommended this was On likely due to ACS.Patient admitted for further management. She reports feeling weak and lightheaded. Denies any fever, chills, headache, blurred vision, nausea, vomiting, chest pain, palpitations, orthopnea, PND, abdominal pain, dysuria (Makes little urine) or diarrhea.  Denies any muscle or joint aches. Patient admitted for further management.   Subjective:   Still has weakness but feeling somewhat better. Right elbow pain resolved. Still has some pain in left knee.   Assessment  & Plan :    Principal Problem:   Supratherapeutic INR No signs of bleeding. INR high despite she being off Coumadin for last 10 days. Now therapeutic after receiving 2 units FFP with dialysis on 12/8. Will resume Coumadin. (Dosing per pharmacy)  Active Problems: Elevated troponin Possibly associated with ESRD. No  EKG changes or chest pain symptoms.Troponin peaked at 1.71. Cardiology consult appreciated. 2-D echo from 3 months back with EF of 35 and 35% and grade 2 diastolic dysfunction. Also showed mid inferior and basal mid inferior lateral hypokinesis. Continue current home medications.  ESRD on hemodialysis Incompletely dialysis on 12/6. Received HD on 12/8. Continue home medications. Nephrology following. Next hemodialysis tomorrow.  Atrial fibrillation Rate controlled. INR now therapeutic. Will resume Coumadin.  Coronary artery disease Continue Plavix and statin.   Hyperglycemia With type 2 diabetes mellitus Blood glucose 400 in the ED. . CBG now in high 200s. I will increase her Lantus dose to 36 units daily.    Ischemic Cardiomyopathy No signs of volume overload. Management with dialysis.    Sacral decubitus ulcer stage II, Leg ulcer Care per nursing, frequent repositioning in bed. Has left shin split , no bleeding or infection, clean eschar over left heel, small deep tissue injury over rt heel. Wound care consulted.  Right elbow and left knee pain. Suspect acute gouty arthritis. Xray of elbow and knee unremarkable for fluid collection or injury. Getting better with oral prednisone.    Failure to thrive in adult Has poor by mouth intake. Nutrition consult appreciated and added supplement. PT evaluation.  Depression After losing her husband recently.  Offered antidepressants but patient refused.   Transferred to telemetry. Patient lives in [place] and does not have access to get home due to inclement weather.  Code Status :Full code  Family Communication  :None at bedside  Disposition Plan  : Home possibly tomorrow once weather improved.  Barriers For Discharge : Active symptoms  Consults  :  Nephrology, cardiology  Procedures  : None  DVT Prophylaxis  :  Coumadin  Lab Results  Component Value Date   PLT 145 (L) 02/12/2017    Antibiotics  :    Anti-infectives (From admission, onward)   None        Objective:   Vitals:   02/13/17 0300 02/13/17 0400 02/13/17 0500 02/13/17 0600  BP: (!) 119/59 120/60 (!) 123/59 127/75  Pulse: 67 67 68 67  Resp: 17 16 19 17   Temp:      TempSrc:      SpO2: 96% 96% 95% 96%  Weight:      Height:        Wt Readings from Last 3 Encounters:  02/12/17 88.8 kg (195 lb 12.3 oz)  02/02/17 91.9 kg (202 lb 9.6 oz)  01/17/17 83.3 kg (183 lb 10.3 oz)     Intake/Output Summary (Last 24 hours) at 02/13/2017 1324 Last data filed at 02/12/2017 2100 Gross per 24 hour  Intake 243 ml  Output -  Net 243 ml     Physical Exam Gen.: Not in distress HEENT: Moist mucosa, supple neck Chest: Clear bilaterally  CVS: Normal S1 and S2, no murmurs GI: Soft, nondistended, nontender Musculoskeletal: Warm, no edema, tenderness over left knee, stage 2 sacral decubitus ulcer, left heel ulcer with eschar tissue, deep tissue injury over right heel.     Data Review:    CBC Recent Labs  Lab 02/10/17 1243 02/11/17 0459 02/11/17 2055 02/12/17 0414  WBC 7.2 6.1 6.9 6.5  HGB 10.5* 9.9* 10.0* 10.0*  HCT 36.1 33.1* 33.5* 33.9*  PLT 174 163 121* 145*  MCV 96.0 95.4 95.2 96.3  MCH 27.9 28.5 28.4 28.4  MCHC 29.1* 29.9* 29.9* 29.5*  RDW 20.8* 20.8* 20.8* 21.3*  LYMPHSABS 0.5* 0.7  --   --   MONOABS  0.3 0.4  --   --   EOSABS 0.0 0.0  --   --   BASOSABS 0.0 0.0  --   --     Chemistries    Recent Labs  Lab 02/10/17 1243 02/11/17 0459 02/11/17 1500 02/12/17 0414  NA 131* 134* 133* 134*  K 4.6 3.6 3.8 4.7  CL 91* 98* 95* 95*  CO2 28 27 26 28   GLUCOSE 401* 133* 273* 121*  BUN 33* 39* 43* 30*  CREATININE 4.06* 4.56* 4.83* 3.89*  CALCIUM 8.9 8.8* 8.7* 8.4*  AST 112*  --   --   --   ALT 60*  --   --   --   ALKPHOS 227*  --   --   --   BILITOT 0.7  --   --   --    ------------------------------------------------------------------------------------------------------------------ No results for input(s): CHOL, HDL, LDLCALC, TRIG, CHOLHDL, LDLDIRECT in the last 72 hours.  Lab Results  Component Value Date   HGBA1C 8.8 (H) 10/31/2016   ------------------------------------------------------------------------------------------------------------------ No results for input(s): TSH, T4TOTAL, T3FREE, THYROIDAB in the last 72 hours.  Invalid input(s): FREET3 ------------------------------------------------------------------------------------------------------------------ No results for input(s): VITAMINB12, FOLATE, FERRITIN, TIBC, IRON, RETICCTPCT in the last 72 hours.  Coagulation profile Recent Labs  Lab 02/10/17 1345 02/11/17 0459 02/11/17 2055 02/12/17 0414 02/13/17 0529  INR >10.00* >10.00* 4.51* 4.72* 2.28    No results for input(s): DDIMER in the last 72 hours.  Cardiac Enzymes Recent Labs  Lab 02/11/17 2055 02/12/17 0414 02/12/17 1023  TROPONINI 1.09* 1.09* 1.22*   ------------------------------------------------------------------------------------------------------------------    Component Value Date/Time   BNP 2,761.4 (H) 12/23/2016 1855    Inpatient Medications  Scheduled Meds: . ALPRAZolam  0.5 mg Oral QHS  . amiodarone  200 mg Oral Daily  . calcitRIOL  0.5 mcg Oral Daily  . clopidogrel  75 mg Oral Daily  . epoetin (EPOGEN/PROCRIT) injection  4,000 Units Intravenous Q T,Th,Sa-HD  . feeding supplement (NEPRO CARB STEADY)  237 mL Oral BID BM   . feeding supplement (PRO-STAT SUGAR FREE 64)  30 mL Oral BID  . ferrous sulfate  325 mg Oral Q breakfast  . fluticasone  1 spray Each Nare Daily  . insulin aspart  0-15 Units Subcutaneous TID WC  . insulin glargine  30 Units Subcutaneous Q2200  . isosorbide mononitrate  30 mg Oral BID  . latanoprost  1 drop Both Eyes QHS  . loratadine  10 mg Oral Daily  . meclizine  12.5 mg Oral BID  . pantoprazole  40 mg Oral Daily  . predniSONE  40 mg Oral Q breakfast  . rosuvastatin  40 mg Oral QHS  . sodium chloride flush  3 mL Intravenous Q12H  . vitamin A & D   Topical QID   Continuous Infusions: . sodium chloride    . sodium chloride    . sodium chloride     PRN Meds:.sodium chloride, sodium chloride, acetaminophen **OR** acetaminophen, albuterol, alteplase, cyclobenzaprine, heparin, lidocaine (PF), lidocaine-prilocaine, ondansetron **OR** ondansetron (ZOFRAN) IV, pentafluoroprop-tetrafluoroeth, senna-docusate  Micro Results No results found for this or any previous visit (from the past 240 hour(s)).  Radiology Reports Dg Chest 2 View  Result Date: 02/10/2017 CLINICAL DATA:  Cough and fatigue EXAM: CHEST  2 VIEW COMPARISON:  January 29, 2017 FINDINGS: There remains airspace consolidation in the right base with small pleural effusions bilaterally. There is cardiomegaly with pulmonary vascularity within normal limits. No adenopathy. Central catheter tip is at the cavoatrial junction. No pneumothorax. No  evident bone lesions. IMPRESSION: Stable cardiac prominence with bilateral pleural effusions. Suspect superimposed pneumonia right base. Lungs elsewhere clear. Pulmonary vascularity appears within normal limits. No pneumothorax. Central catheter position unchanged. Electronically Signed   By: Lowella Grip III M.D.   On: 02/10/2017 13:58   Dg Elbow 2 Views Right  Result Date: 02/12/2017 CLINICAL DATA:  71 year old female with right elbow pain. EXAM: RIGHT ELBOW - 2 VIEW COMPARISON:   03/04/2004 FINDINGS: Please note, this is a limited evaluation due to suboptimal positioning. There is no evidence of fracture, dislocation, or joint effusion. There is no evidence of arthropathy or other focal bone abnormality. Soft tissues are unremarkable. IMPRESSION: Limited study due to suboptimal positioning. No gross osseous or soft tissue abnormalities. Electronically Signed   By: Kristopher Oppenheim M.D.   On: 02/12/2017 09:32   Dg Knee 1-2 Views Left  Result Date: 02/12/2017 CLINICAL DATA:  71 year old female with left knee pain. EXAM: LEFT KNEE - 1-2 VIEW COMPARISON:  None. FINDINGS: No evidence of fracture, dislocation, or joint effusion. No focal bone abnormality. There are mild degenerative changes with chondrocalcinosis noted. Soft tissues are unremarkable. IMPRESSION: No acute osseous abnormality. Electronically Signed   By: Kristopher Oppenheim M.D.   On: 02/12/2017 09:31   Ir Fluoro Guide Cv Line Right  Result Date: 01/16/2017 CLINICAL DATA:  End-stage renal disease with current non tunneled temporary hemodialysis catheter. A tunneled hemodialysis catheter is now needed for longer-term use. EXAM: TUNNELED CENTRAL VENOUS HEMODIALYSIS CATHETER PLACEMENT WITH ULTRASOUND AND FLUOROSCOPIC GUIDANCE ANESTHESIA/SEDATION: 2.0 mg IV Versed; 75 mcg IV Fentanyl. Total Moderate Sedation Time:  24  minutes. The patient's level of consciousness and physiologic status were continuously monitored during the procedure by Radiology nursing. MEDICATIONS: 2 g IV Ancef. As antibiotic prophylaxis, Ancef was ordered pre-procedure and administered intravenously within one hour of incision. FLUOROSCOPY TIME:  1 minutes and 12 seconds.  4.0 mGy. PROCEDURE: The procedure, risks, benefits, and alternatives were explained to the patient. Questions regarding the procedure were encouraged and answered. The patient understands and consents to the procedure. A timeout was performed prior to initiating the procedure. The right neck  and chest were prepped with chlorhexidine in a sterile fashion, and a sterile drape was applied covering the operative field. Maximum barrier sterile technique with sterile gowns and gloves were used for the procedure. Local anesthesia was provided with 1% lidocaine. After creating a small venotomy incision, a 21 gauge needle was advanced into the right internal jugular vein under direct, real-time ultrasound guidance. Ultrasound image documentation was performed. After securing guidewire access, an 8 Fr dilator was placed. A J-wire was kinked to measure appropriate catheter length. A Palindrome tunneled hemodialysis catheter measuring 23 cm from tip to cuff was chosen for placement. This was tunneled in a retrograde fashion from the chest wall to the venotomy incision. At the venotomy, serial dilatation was performed and a 16 Fr peel-away sheath was placed over a guidewire. The catheter was then placed through the sheath and the sheath removed. Final catheter positioning was confirmed and documented with a fluoroscopic spot image. The catheter was aspirated, flushed with saline, and injected with appropriate volume heparin dwells. The venotomy incision was closed with subcutaneous subcuticular 4-0 Vicryl. Dermabond was applied to the incision. The catheter exit site was secured with 0-Prolene retention sutures. COMPLICATIONS: None.  No pneumothorax. FINDINGS: After catheter placement, the tip lies in the right atrium. The catheter aspirates normally and is ready for immediate use. IMPRESSION: Placement of tunneled hemodialysis catheter  via the right internal jugular vein. The catheter tip lies in the right atrium. The catheter is ready for immediate use. Electronically Signed   By: Aletta Edouard M.D.   On: 01/16/2017 15:41   Ir US Guide Vasc Access Right  Result Date: 01/16/2017 CLINICAL DATA:  End-stage renal disease with current non tunneled temporary hemodialysis catheter. A tunneled hemodialysis catheter  is now needed for longer-term use. EXAM: TUNNELED CENTRAL VENOUS HEMODIALYSIS CATHETER PLACEMENT WITH ULTRASOUND AND FLUOROSCOPIC GUIDANCE ANESTHESIA/SEDATION: 2.0 mg IV Versed; 75 mcg IV Fentanyl. Total Moderate Sedation Time:  24  minutes. The patient's level of consciousness and physiologic status were continuously monitored during the procedure by Radiology nursing. MEDICATIONS: 2 g IV Ancef. As antibiotic prophylaxis, Ancef was ordered pre-procedure and administered intravenously within one hour of incision. FLUOROSCOPY TIME:  1 minutes and 12 seconds.  4.0 mGy. PROCEDURE: The procedure, risks, benefits, and alternatives were explained to the patient. Questions regarding the procedure were encouraged and answered. The patient understands and consents to the procedure. A timeout was performed prior to initiating the procedure. The right neck and chest were prepped with chlorhexidine in a sterile fashion, and a sterile drape was applied covering the operative field. Maximum barrier sterile technique with sterile gowns and gloves were used for the procedure. Local anesthesia was provided with 1% lidocaine. After creating a small venotomy incision, a 21 gauge needle was advanced into the right internal jugular vein under direct, real-time ultrasound guidance. Ultrasound image documentation was performed. After securing guidewire access, an 8 Fr dilator was placed. A J-wire was kinked to measure appropriate catheter length. A Palindrome tunneled hemodialysis catheter measuring 23 cm from tip to cuff was chosen for placement. This was tunneled in a retrograde fashion from the chest wall to the venotomy incision. At the venotomy, serial dilatation was performed and a 16 Fr peel-away sheath was placed over a guidewire. The catheter was then placed through the sheath and the sheath removed. Final catheter positioning was confirmed and documented with a fluoroscopic spot image. The catheter was aspirated, flushed with  saline, and injected with appropriate volume heparin dwells. The venotomy incision was closed with subcutaneous subcuticular 4-0 Vicryl. Dermabond was applied to the incision. The catheter exit site was secured with 0-Prolene retention sutures. COMPLICATIONS: None.  No pneumothorax. FINDINGS: After catheter placement, the tip lies in the right atrium. The catheter aspirates normally and is ready for immediate use. IMPRESSION: Placement of tunneled hemodialysis catheter via the right internal jugular vein. The catheter tip lies in the right atrium. The catheter is ready for immediate use. Electronically Signed   By: Aletta Edouard M.D.   On: 01/16/2017 15:41   Dg Chest Port 1 View  Result Date: 01/29/2017 CLINICAL DATA:  Increasing weakness after dialysis this afternoon. EXAM: PORTABLE CHEST 1 VIEW COMPARISON:  12/26/2016 FINDINGS: Interval placement of a right central venous catheter with tip over the cavoatrial junction region. No pneumothorax. Cardiac enlargement with pulmonary vascular congestion. Bilateral pleural effusions with basilar atelectasis. No significant change since previous study. IMPRESSION: Cardiac enlargement, pulmonary vascular congestion, bilateral pleural effusions, and basilar atelectasis similar to previous study. Electronically Signed   By: Lucienne Capers M.D.   On: 01/29/2017 01:09    Time Spent in minutes  25   Lynise Porr M.D on 02/13/2017 at 8:38 AM  Between 7am to 7pm - Pager - [phone]  After 7pm go to [email or web address] - password [facility]  [facility] -  Office  [phone]

## 2017-02-14 DIAGNOSIS — M109 Gout, unspecified: Secondary | ICD-10-CM

## 2017-02-14 DIAGNOSIS — M7030 Other bursitis of elbow, unspecified elbow: Secondary | ICD-10-CM

## 2017-02-14 LAB — CBC
HEMATOCRIT: 35.4 % — AB (ref 36.0–46.0)
Hemoglobin: 10.4 g/dL — ABNORMAL LOW (ref 12.0–15.0)
MCH: 27.9 pg (ref 26.0–34.0)
MCHC: 29.4 g/dL — ABNORMAL LOW (ref 30.0–36.0)
MCV: 94.9 fL (ref 78.0–100.0)
PLATELETS: 123 10*3/uL — AB (ref 150–400)
RBC: 3.73 MIL/uL — AB (ref 3.87–5.11)
RDW: 21.1 % — AB (ref 11.5–15.5)
WBC: 6.4 10*3/uL (ref 4.0–10.5)

## 2017-02-14 LAB — DIC (DISSEMINATED INTRAVASCULAR COAGULATION)PANEL
D-Dimer, Quant: 0.79 ug/mL-FEU — ABNORMAL HIGH (ref 0.00–0.50)
Fibrinogen: 626 mg/dL — ABNORMAL HIGH (ref 210–475)
Platelets: 113 10*3/uL — ABNORMAL LOW (ref 150–400)
aPTT: 144 seconds — ABNORMAL HIGH (ref 24–36)

## 2017-02-14 LAB — GLUCOSE, CAPILLARY
GLUCOSE-CAPILLARY: 47 mg/dL — AB (ref 65–99)
GLUCOSE-CAPILLARY: 47 mg/dL — AB (ref 65–99)
GLUCOSE-CAPILLARY: 60 mg/dL — AB (ref 65–99)
GLUCOSE-CAPILLARY: 82 mg/dL (ref 65–99)
GLUCOSE-CAPILLARY: 63 mg/dL — AB (ref 65–99)
GLUCOSE-CAPILLARY: 200 mg/dL — AB (ref 65–99)
Glucose-Capillary: 268 mg/dL — ABNORMAL HIGH (ref 65–99)
Glucose-Capillary: 91 mg/dL (ref 65–99)

## 2017-02-14 LAB — HEPATIC FUNCTION PANEL
ALK PHOS: 239 U/L — AB (ref 38–126)
ALT: 81 U/L — AB (ref 14–54)
AST: 161 U/L — ABNORMAL HIGH (ref 15–41)
Albumin: 2.4 g/dL — ABNORMAL LOW (ref 3.5–5.0)
BILIRUBIN DIRECT: 0.4 mg/dL (ref 0.1–0.5)
BILIRUBIN INDIRECT: 0.4 mg/dL (ref 0.3–0.9)
TOTAL PROTEIN: 6.6 g/dL (ref 6.5–8.1)
Total Bilirubin: 0.8 mg/dL (ref 0.3–1.2)

## 2017-02-14 LAB — RENAL FUNCTION PANEL
ALBUMIN: 2.4 g/dL — AB (ref 3.5–5.0)
Anion gap: 14 (ref 5–15)
BUN: 63 mg/dL — AB (ref 6–20)
CO2: 23 mmol/L (ref 22–32)
CREATININE: 5.11 mg/dL — AB (ref 0.44–1.00)
Calcium: 8.2 mg/dL — ABNORMAL LOW (ref 8.9–10.3)
Chloride: 90 mmol/L — ABNORMAL LOW (ref 101–111)
GFR, EST AFRICAN AMERICAN: 9 mL/min — AB (ref >60)
GFR, EST NON AFRICAN AMERICAN: 8 mL/min — AB (ref >60)
Glucose, Bld: 105 mg/dL — ABNORMAL HIGH (ref 65–99)
PHOSPHORUS: 5.2 mg/dL — AB (ref 2.5–4.6)
POTASSIUM: 5.5 mmol/L — AB (ref 3.5–5.1)
Sodium: 127 mmol/L — ABNORMAL LOW (ref 135–145)

## 2017-02-14 LAB — DIC (DISSEMINATED INTRAVASCULAR COAGULATION) PANEL: PROTHROMBIN TIME: 67.7 s — AB (ref 11.4–15.2)

## 2017-02-14 LAB — PROTIME-INR
INR: 7.39 — AB
Prothrombin Time: 62.5 seconds — ABNORMAL HIGH (ref 11.4–15.2)

## 2017-02-14 MED ORDER — INSULIN GLARGINE 100 UNIT/ML ~~LOC~~ SOLN
28.0000 [IU] | Freq: Every day | SUBCUTANEOUS | Status: DC
  Administered 2017-02-14 – 2017-02-15 (×2): 28 [IU] via SUBCUTANEOUS
  Filled 2017-02-14 (×6): qty 0.28

## 2017-02-14 MED ORDER — EPOETIN ALFA 4000 UNIT/ML IJ SOLN
INTRAMUSCULAR | Status: AC
  Administered 2017-02-14: 4000 [IU] via INTRAVENOUS
  Filled 2017-02-14: qty 1

## 2017-02-14 MED ORDER — SODIUM CHLORIDE 0.9 % IV SOLN
Freq: Once | INTRAVENOUS | Status: AC
  Administered 2017-02-14: 15:00:00 via INTRAVENOUS

## 2017-02-14 MED ORDER — DEXTROSE 50 % IV SOLN
25.0000 mL | Freq: Once | INTRAVENOUS | Status: AC
  Administered 2017-02-14: 25 mL via INTRAVENOUS

## 2017-02-14 MED ORDER — LIDOCAINE 5 % EX PTCH
1.0000 | MEDICATED_PATCH | CUTANEOUS | Status: DC
  Administered 2017-02-14 – 2017-02-22 (×6): 1 via TRANSDERMAL
  Filled 2017-02-14 (×6): qty 1

## 2017-02-14 MED ORDER — DEXTROSE 50 % IV SOLN
INTRAVENOUS | Status: AC
  Administered 2017-02-14: 13:00:00
  Filled 2017-02-14: qty 50

## 2017-02-14 MED ORDER — SODIUM CHLORIDE 0.9 % IV SOLN: 100.0000 mL | INTRAVENOUS | Status: DC | PRN

## 2017-02-14 MED ORDER — HEPARIN SODIUM (PORCINE) 1000 UNIT/ML IJ SOLN
INTRAMUSCULAR | Status: AC
  Administered 2017-02-14: 1000 [IU] via INTRAVENOUS_CENTRAL
  Filled 2017-02-14: qty 4

## 2017-02-14 NOTE — Progress Notes (Signed)
CRITICAL VALUE ALERT  Critical Value:   INR 8.19  Date & Time Notied:  02/14/17 1440  Provider Notified:  Dr Clementeen Graham notified.   Orders Received/Actions taken: orders placed by MD

## 2017-02-14 NOTE — NC FL2 (Signed)
[place] LEVEL OF CARE SCREENING TOOL     IDENTIFICATION  Patient Name: [name] Birthdate: [birth year] Sex: female Admission Date (Current Location): 02/10/2017  [facility] and [place] Number:  ([place])   Facility and Address:  [place] [address] [place]      Provider Number: [id number]  Attending Physician Name and Address:  Louellen Molder, MD  Relative Name and Phone Number:       Current Level of Care: Hospital Recommended Level of Care: [place] Prior Approval Number:    Date Approved/Denied:   PASRR Number:    Discharge Plan: SNF    Current Diagnoses: Patient Active Problem List   Diagnosis Date Noted  . Supratherapeutic INR 02/10/2017  . Sacral decubitus ulcer 02/10/2017  . Failure to thrive in adult 02/10/2017  . Sinus bradycardia   . ESRD on hemodialysis ([place])   . 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 Encounter for therapeutic drug monitoring 01/19/2017  . Pressure injury of skin 01/16/2017  . Rhonchi   . Goals of care, counseling/discussion   . Palliative care encounter   . Hypercoagulopathy ([place]) 12/03/2016  . Ischemic cardiomyopathy   . Generalized weakness   . Abnormal nuclear stress test   . Left ventricular dysfunction   . PAF (paroxysmal atrial fibrillation) ([place]) 11/13/2016  . Anemia in chronic kidney disease   . Atrial fibrillation, new onset ([place]) 11/10/2016  . Pleural effusion on right 10/31/2016  . HCAP (healthcare-associated pneumonia) 10/31/2016  . DM type 2 causing vascular disease ([place]) 10/05/2016  . Personal history of noncompliance with medical treatment, presenting hazards to health 08/22/2016  . Hyperglycemia 08/07/2016  . Pulmonary edema 07/14/2016  . Ischemia of upper extremity   . Right knee pain   . Subclavian artery stenosis, right ([place]) 07/07/2015  . Elevated troponin 07/04/2015  . Non-ST elevation (NSTEMI) myocardial infarction ([place]) 07/04/2015   . Bilateral lower extremity edema 01/20/2012  . Type 2 diabetes mellitus with stage 4 chronic kidney disease, with long-term current use of insulin ([place]) 01/21/2011  . Overweight 07/20/2009  . Coronary artery disease 10/08/2008  . HEART MURMUR, SYSTOLIC 84/16/6063  . SHOULDER PAIN 02/05/2007  . Hyperlipemia 04/11/2006  . Anxiety state 04/11/2006  . SYNDROME, CARPAL TUNNEL 04/11/2006  . Essential hypertension 04/11/2006  . ALLERGIC RHINITIS 04/11/2006  . GERD 04/11/2006  . Constipation 04/11/2006  . OVERACTIVE BLADDER 04/11/2006  . OSTEOARTHRITIS 04/11/2006  . LOW BACK PAIN 04/11/2006    Orientation RESPIRATION BLADDER Height & Weight     Self, Time, Situation, Place  O2(2L) Continent Weight: 202 lb 1.6 oz (91.7 kg) Height:  5\' 8"  (172.7 cm)  BEHAVIORAL SYMPTOMS/MOOD NEUROLOGICAL BOWEL NUTRITION STATUS      Continent Diet(Heart Healthy/carb modified)  AMBULATORY STATUS COMMUNICATION OF NEEDS Skin   Limited Assist Verbally PU Stage and Appropriate Care([place]ac[place], medial)                       Personal Care Assistance Level of Assistance  Bathing, Feeding, Dressing   Feeding assistance: Independent Dressing Assistance: Limited assistance     Functional Limitations Info  Sight, Hearing, Speech Sight Info: Adequate Hearing Info: Adequate Speech Info: Adequate    SPECIAL CARE FACTORS FREQUENCY  PT (By licensed PT)     PT Frequency: 5x/week              Contractures Contractures Info: Not present    Additional Factors Info  Code Status, Allergies, Psychotropic  Code Status Info: Full Code Allergies Info: Ace inhibitors, Amlodipine, codeine Psychotropic Info: Xanax         Current Medications (02/14/2017):  This is the current hospital active medication list Current Facility-Administered Medications  Medication Dose Route Frequency Provider Last Rate Last Dose  . 0.9 %  sodium chloride infusion   Intravenous Once Dhungel, Nishant, MD      . 0.9 %  sodium  chloride infusion  100 mL Intravenous PRN Befekadu, Belayenh, MD      . 0.9 %  sodium chloride infusion  100 mL Intravenous PRN Fran Lowes, MD      . acetaminophen (TYLENOL) tablet 650 mg  650 mg Oral Q6H PRN Truett Mainland, DO   650 mg at 02/14/17 0845   Or  . acetaminophen (TYLENOL) suppository 650 mg  650 mg Rectal Q6H PRN Truett Mainland, DO      . albuterol (PROVENTIL) (2.5 MG/3ML) 0.083% nebulizer solution 2.5-5 mg  2.5-5 mg Inhalation Q6H PRN Truett Mainland, DO   2.5 mg at 02/13/17 2113  . ALPRAZolam Duanne Moron) tablet 0.5 mg  0.5 mg Oral QHS Truett Mainland, DO   0.5 mg at 02/13/17 2111  . alteplase (CATHFLO ACTIVASE) injection 2 mg  2 mg Intracatheter Once PRN Fran Lowes, MD      . amiodarone (PACERONE) tablet 200 mg  200 mg Oral Daily Truett Mainland, DO   200 mg at 02/13/17 5956  . calcitRIOL (ROCALTROL) capsule 0.5 mcg  0.5 mcg Oral Daily Truett Mainland, DO   0.5 mcg at 02/14/17 0946  . clopidogrel (PLAVIX) tablet 75 mg  75 mg Oral Daily Truett Mainland, DO   75 mg at 02/13/17 0950  . cyclobenzaprine (FLEXERIL) tablet 5 mg  5 mg Oral Daily PRN Truett Mainland, DO   5 mg at 02/11/17 2302  . epoetin alfa (EPOGEN,PROCRIT) injection 4,000 Units  4,000 Units Intravenous Q T,Th,Sa-HD Fran Lowes, MD      . feeding supplement (NEPRO CARB STEADY) liquid 237 mL  237 mL Oral BID BM Dhungel, Nishant, MD   237 mL at 02/13/17 1247  . feeding supplement (PRO-STAT SUGAR FREE 64) liquid 30 mL  30 mL Oral BID Dhungel, Nishant, MD   30 mL at 02/14/17 0947  . ferrous sulfate tablet 325 mg  325 mg Oral Q breakfast Truett Mainland, DO   325 mg at 02/14/17 0946  . fluticasone (FLONASE) 50 MCG/ACT nasal spray 1 spray  1 spray Each Nare Daily Truett Mainland, DO   1 spray at 02/14/17 0947  . heparin injection 1,000 Units  1,000 Units Dialysis PRN Fran Lowes, MD      . insulin aspart (novoLOG) injection 0-15 Units  0-15 Units Subcutaneous TID WC Truett Mainland, DO   8  Units at 02/14/17 [phone]  . insulin glargine (LANTUS) injection 36 Units  36 Units Subcutaneous Q2200 Dhungel, Nishant, MD   36 Units at 02/13/17 2111  . isosorbide mononitrate (IMDUR) 24 hr tablet 30 mg  30 mg Oral BID Truett Mainland, DO   30 mg at 02/13/17 2111  . latanoprost (XALATAN) 0.005 % ophthalmic solution 1 drop  1 drop Both Eyes QHS Truett Mainland, DO   1 drop at 02/13/17 2111  . lidocaine (PF) (XYLOCAINE) 1 % injection 5 mL  5 mL Intradermal PRN Fran Lowes, MD      . lidocaine-prilocaine (EMLA) cream 1 application  1 application Topical PRN Fran Lowes,  MD      . loratadine (CLARITIN) tablet 10 mg  10 mg Oral Daily Truett Mainland, DO   10 mg at 02/14/17 0946  . meclizine (ANTIVERT) tablet 12.5 mg  12.5 mg Oral BID Truett Mainland, DO   12.5 mg at 02/14/17 0946  . ondansetron (ZOFRAN) tablet 4 mg  4 mg Oral Q6H PRN Truett Mainland, DO       Or  . ondansetron [facility]) injection 4 mg  4 mg Intravenous Q6H PRN Truett Mainland, DO      . pantoprazole (PROTONIX) EC tablet 40 mg  40 mg Oral Daily Truett Mainland, DO   40 mg at 02/14/17 0946  . pentafluoroprop-tetrafluoroeth (GEBAUERS) aerosol 1 application  1 application Topical PRN Fran Lowes, MD      . predniSONE (DELTASONE) tablet 40 mg  40 mg Oral Q breakfast Dhungel, Nishant, MD   40 mg at 02/14/17 0946  . rosuvastatin (CRESTOR) tablet 40 mg  40 mg Oral QHS Dhungel, Nishant, MD   40 mg at 02/13/17 2110  . senna-docusate (Senokot-S) tablet 2 tablet  2 tablet Oral Daily PRN Dhungel, Nishant, MD   2 tablet at 02/12/17 1236  . sodium chloride flush (NS) 0.9 % injection 3 mL  3 mL Intravenous Q12H Truett Mainland, DO   3 mL at 02/14/17 0954  . vitamin A & D ointment   Topical QID Dhungel, Nishant, MD   5 application at 45/36/46 0400  . Warfarin - Pharmacist Dosing Inpatient   Does not apply Q24H Dhungel, Nishant, MD         Discharge Medications: Please see discharge summary for a list of discharge  medications.  Relevant Imaging Results:  Relevant Lab Results:   Additional Information SSN [id number] Clydene Pugh, LCSW

## 2017-02-14 NOTE — Clinical Social Work Note (Signed)
Patient was previously in observation from 11/25-11/29. At this time she declined SNF placement and went home with HHPT. At this time, patient is interested in SNF ([name] and [name]).  See previous clinical assessment below.   Clinical Social Work Assessment  Patient Details  Name: [name] MRN: [id number] Date of Birth: May 01, 1945  Date of referral:  01/31/17               Reason for consult:  Facility Placement, Discharge Planning                    Permission sought to share information with:    Permission granted to share information::                Name::                   Agency::                Relationship::                Contact Information:  daughters were at bedside.   Housing/Transportation Living arrangements for the past 2 months:  [place], [place] of Information:  Patient, Adult Children Patient Interpreter Needed:  None Criminal Activity/Legal Involvement Pertinent to Current Situation/Hospitalization:  No - Comment as needed Significant Relationships:  Adult Children Lives with:  Adult Children Do you feel safe going back to the place where you live?  Yes Need for family participation in patient care:  Yes (Comment)  Care giving concerns:  No concerns identified at baseline.    Social Worker assessment / plan:  Patient states that she uses a walker at baseline, lives with one of her daughters and they assist her with ADLs as needed. Patient states that she has [facility] with OT, PT, nurse and aid services. She states she was told that they would increase from three days per week to five days per week. Patient reported that she buried her husband last week and this had impacted her emotionally. LCSW provided brief supportive counseling.  LCSW discussed discharge planning. Patient stated that she just recently got out of rehab at [facility] in [place], [place]. The end of Oct. And that she may consider going back.  She stated that right now she is leaning towards going home at discharge. She did give permission for referral to be sent to [facility].   Employment status:  Retired Advertising copywriter, [employer] PT Recommendations:  [place] / Referral to community resources:  [place]  Patient/Family's Response to care:  Patient is considering SNF.   Patient/Family's Understanding of and Emotional Response to Diagnosis, Current Treatment, and Prognosis:  Patient and family understand patient's diagnosis, treatment and prognosis.   Emotional Assessment Appearance:  Appears stated age Attitude/Demeanor/Rapport:    Affect (typically observed):  Accepting, Calm Orientation:  Oriented to Self, Oriented to Place, Oriented to  Time, Oriented to Situation Alcohol / Substance use:  Not Applicable Psych involvement (Current and /or in the community):  No (Comment)  Discharge Needs  Concerns to be addressed:  Care Coordination Readmission within the last 30 days:  Yes Current discharge risk:  None Barriers to Discharge:  No Barriers Identified   Ihor Gully, LCSW 01/31/2017, 12:02 PM           Electronically signed by Ihor Gully, LCSW at 01/31/2017 12:20 PM     ED to Hosp-Admission (  Discharged) on 01/28/2017        Detailed Report

## 2017-02-14 NOTE — Progress Notes (Signed)
Subjective: Interval History: She feels a slightly better but still feels weak and tired.  She complains of also some pain of her right hand and knee.  She did not have any nausea or vomiting.  Objective: Vital signs in last 24 hours: Temp:  [97.6 F (36.4 C)-98.4 F (36.9 C)] 98.4 F (36.9 C) (12/11 5366) Pulse Rate:  [63-70] 63 (12/11 0638) Resp:  [16-18] 18 (12/11 4403) BP: (106-133)/(46-92) 106/46 (12/11 4742) SpO2:  [92 %-98 %] 96 % (12/11 5956) Weight:  [91.7 kg (202 lb 1.6 oz)] 91.7 kg (202 lb 1.6 oz) (12/11 3875) Weight change:   Intake/Output from previous day: 12/10 0701 - 12/11 0700 In: 3 [I.V.:3] Out: -  Intake/Output this shift: No intake/output data recorded.  General appearance: alert, cooperative and no distress Resp: clear to auscultation bilaterally Cardio: regular rate and rhythm Extremities: No edema  Lab Results: Recent Labs    02/12/17 0414 02/14/17 0527  WBC 6.5 6.4  HGB 10.0* 10.4*  HCT 33.9* 35.4*  PLT 145* 123*   BMET:  Recent Labs    02/12/17 0414 02/14/17 0416  NA 134* 127*  K 4.7 5.5*  CL 95* 90*  CO2 28 23  GLUCOSE 121* 105*  BUN 30* 63*  CREATININE 3.89* 5.11*  CALCIUM 8.4* 8.2*   No results for input(s): PTH in the last 72 hours. Iron Studies: No results for input(s): IRON, TIBC, TRANSFERRIN, FERRITIN in the last 72 hours.  Studies/Results: Dg Elbow 2 Views Right  Result Date: 02/12/2017 CLINICAL DATA:  71 year old female with right elbow pain. EXAM: RIGHT ELBOW - 2 VIEW COMPARISON:  03/04/2004 FINDINGS: Please note, this is a limited evaluation due to suboptimal positioning. There is no evidence of fracture, dislocation, or joint effusion. There is no evidence of arthropathy or other focal bone abnormality. Soft tissues are unremarkable. IMPRESSION: Limited study due to suboptimal positioning. No gross osseous or soft tissue abnormalities. Electronically Signed   By: Kristopher Oppenheim M.D.   On: 02/12/2017 09:32   Dg Knee 1-2  Views Left  Result Date: 02/12/2017 CLINICAL DATA:  71 year old female with left knee pain. EXAM: LEFT KNEE - 1-2 VIEW COMPARISON:  None. FINDINGS: No evidence of fracture, dislocation, or joint effusion. No focal bone abnormality. There are mild degenerative changes with chondrocalcinosis noted. Soft tissues are unremarkable. IMPRESSION: No acute osseous abnormality. Electronically Signed   By: Kristopher Oppenheim M.D.   On: 02/12/2017 09:31    I have reviewed the patient's current medications.  Assessment/Plan: 1] end-stage renal disease: She is status post hemodialysis on Saturday.  Her appetite remains poor but does not have any nausea or vomiting.  Her potassium is 5.5.  She is due for dialysis today. 2] anemia: Her hemoglobin has declined slightly below our target goal. 3] bone and mineral disorder: Her calcium and phosphorus is range 4] hypertension: Her blood pressure is reasonably controlled 5] fluid management: No sign of fluid overload.  Patient still denies any difficulty breathing. 6] feeling tired and weak.  At this moment etiology not clear.  No significant change.  Patient is on physical therapy Plan: We will make arrangement for patient to get dialysis today 2] we will dialyze her for 4 hours and possibly remove about 2 L 3] we will use 2K potassium/2.5 calcium bath.    LOS: 4 days   Zelene Barga S 02/14/2017,9:05 AM

## 2017-02-14 NOTE — Progress Notes (Signed)
Physical Therapy Treatment Patient Details Name: [name] MRN: [id number] DOB: [birth year] Today's Date: 02/14/2017    History of Present Illness [name] is a 71 y.o. female with a history of ischemic cardiomyopathy with an STEMI in 2017 with stent placements, end-stage renal disease on dialysis (recent start of dialysis), coronary artery disease, chronic combined systolic and diastolic heart failure, hypertension, GERD, hyperlipidemia, type 2 diabetes.  Patient has been feeling weak and tired over the past few weeks.  She was recently hospitalized at the end of November due to hypoglycemia secondary to insulin use and poor p.o. intake.  Her poor p.o. intake is secondary to her husband passing away last month.  Her insulin was stopped.  Her INR has been elevated over the past several days and, according to the patient, her Coumadin was stopped about a week ago.  Her INR has continued to be elevated and has not returned to normal levels.  Today, she was instructed to go to the radiology office for an INR, which was greater than 10.  Repeat in the emergency department was the same.  Laboratory data shows elevated troponin at 1.68.  Cardiology was consulted, who is doubtful that this is an NSTEMI or ischemic in nature, but applies this is secondary to elevation due to end-stage renal disease.  No palliating factors.  No chest pain    PT Comments    Pt supine in bed and willing to participate.  Pt expresses pain in Rt elbow and Lt knee, RN aware and was premedicated with Tylenol prior therapy session.   Pt presented with max assistance with bed mobility and presents with severe weakness in core/Le with inability to sit on edge of bed without posterior lean to the right.  Max assistance with stand pivot transfer with max cueing for hand and foot placement.  EOS pt left in chair with call bell within reach and nursing staff in room.  No reports of increased pain through session.      Follow Up Recommendations  SNF;Supervision/Assistance - 24 hour     Equipment Recommendations  None recommended by PT    Recommendations for Other Services       Precautions / Restrictions Precautions Precautions: Fall Restrictions Weight Bearing Restrictions: No    Mobility  Bed Mobility Overal bed mobility: Needs Assistance Bed Mobility: Supine to Sit     Supine to sit: Max assist;Mod assist     General bed mobility comments: Pt unable to sit on EOB without posterior lean or the Rt side, mod assist to sit up on edge of bed  Transfers Overall transfer level: Needs assistance Equipment used: Rolling walker (2 wheeled) Transfers: Sit to/from [facility] Sit to Stand: Mod assist;Max assist Stand pivot transfers: Mod assist;Max assist       General transfer comment: difficulty keeping trunk extended secondary to weakness  Ambulation/Gait Ambulation/Gait assistance: Max assist Ambulation Distance (Feet): 2 Feet Assistive device: Rolling walker (2 wheeled) Gait Pattern/deviations: Decreased step length - right;Decreased step length - left;Decreased stance time - left;Decreased stride length;Decreased stance time - right     General Gait Details: Patient limited to 3-4 side steps secondary to BLE weakness, poor standing balance   Stairs            Wheelchair Mobility    Modified Rankin (Stroke Patients Only)       Balance  Cognition Arousal/Alertness: Awake/alert Behavior During Therapy: WFL for tasks assessed/performed(fatigued with sitting activity) Overall Cognitive Status: Within Functional Limits for tasks assessed                                 General Comments: Pt very drowsy at entrance, reports Rt elbow pain was given Tylonel by RN prior treatment.  Vitals and sugar level checked at EOS.        Exercises      General Comments         Pertinent Vitals/Pain Pain Assessment: 0-10 Pain Score: 8  Pain Location: Right elbow and left knee Pain Descriptors / Indicators: Aching Pain Intervention(s): Limited activity within patient's tolerance;Monitored during session;Repositioned    Home Living                      Prior Function            PT Goals (current goals can now be found in the care plan section) Acute Rehab PT Goals Patient Stated Goal: Return home with family to assist PT Goal Formulation: With patient Time For Goal Achievement: 02/20/17 Potential to Achieve Goals: Good Progress towards PT goals: Progressing toward goals    Frequency    Min 3X/week      PT Plan Current plan remains appropriate    Co-evaluation              AM-PAC PT "6 Clicks" Daily Activity  Outcome Measure  Difficulty turning over in bed (including adjusting bedclothes, sheets and blankets)?: A Lot Difficulty moving from lying on back to sitting on the side of the bed? : A Lot Difficulty sitting down on and standing up from a chair with arms (e.g., wheelchair, bedside commode, etc,.)?: A Lot Help needed moving to and from a bed to chair (including a wheelchair)?: A Lot Help needed walking in hospital room?: Total Help needed climbing 3-5 steps with a railing? : Total 6 Click Score: 10    End of Session Equipment Utilized During Treatment: Gait belt Activity Tolerance: Patient limited by fatigue Patient left: in chair;with call bell/phone within reach;with nursing/sitter in room Nurse Communication: Mobility status PT Visit Diagnosis: Unsteadiness on feet (R26.81);Other abnormalities of gait and mobility (R26.89);Muscle weakness (generalized) (M62.81)     Time: 2878-6767 PT Time Calculation (min) (ACUTE ONLY): 36 min  Charges:  $Therapeutic Activity: 23-37 mins                    G Codes:       Ihor Austin, LPTA; CBIS [phone]  Aldona Lento 02/14/2017, 2:00 PM

## 2017-02-14 NOTE — Progress Notes (Signed)
Pt received dialysis today. No complications. Vitals stable upon leaving bedside. 2 units of FFP given without complications.

## 2017-02-14 NOTE — Care Management Note (Signed)
Case Management Note  Patient Details  Name: DASHANAE LONGFIELD MRN: [id number] Date of Birth: Nov 12, 1945  Subjective/Objective:  Adm with supratherapeutic INR. From home with Home health provided by [facility]. She has again been recommended for SNF. She is agreeable and interested in [place], reports she has been to [facility]  a few months ago.                    Action/Plan:CSW consulted for SNF placement. CM will continue to follow.    Expected Discharge Date:   02/16/2017               Expected Discharge Plan:     In-House Referral:  Clinical Social Work  Discharge planning Services  CM Consult  Post Acute Care Choice:    Choice offered to:     DME Arranged:    DME Agency:     HH Arranged:    HH Agency:     Status of Service:  In process, will continue to follow  If discussed at Long Length of Stay Meetings, dates discussed:    Additional Comments:  Feliz Lincoln, Chauncey Reading, RN 02/14/2017, 10:12 AM

## 2017-02-14 NOTE — Clinical Social Work Note (Signed)
Patient Information   Patient Name Izzie, Geers ([id number]) Sex Female DOB 12/26/1945  Room Bed  A305 A305-01  Patient Demographics   Address [address] [place] [address] Phone [phone] (Home) E-mail Address [email or web address]  Patient Ethnicity & Race   Ethnic Group Patient Race  Not Hispanic or Latino Black or African American  Emergency Contact(s)   Name Relation Home Work Mobile  Abbott,Celitia D Daughter [phone]    Cordelia Pen [phone]    Documents on File    Status Date Received Description  Documents for the Patient  EMR Medication Summary Not Received    EMR Problem Summary Not Received    EMR Patient Summary Not Received    Historic Radiology Documentation Not Received    [place] Not Received    [place] E-Signature HIPAA Notice of Privacy Received 01/21/11   [place] E-Signature HIPAA Notice of Privacy Spanish Not Received    Driver's License Not Received    Insurance Card Not Received    Advance Directives/Living Will/HCPOA/POA Not Received    Editor, commissioning Not Received    Insurance Card Not Received    [place] Received 09/20/11 gi-[facility]mc  Insurance Card Received 09/19/13 medicare,bcbs  Insurance Card Not Received    [place] HIPAA NOTICE OF PRIVACY - Scanned Not Received    Insurance Card Received 12/04/14 BCBS/MCR 2016  Insurance Card Received 08/03/12 bcbs/mcr  Release of Information Not Received    HIM ROI Authorization  02/21/13   HIM ROI Authorization (Expired) 09/23/13 Patient in office now. Need colonoscopy done in 2002.  Release of Information  09/26/13   HIM ROI Authorization  10/01/13   AMB Correspondence  03/04/14 REFERRAL NIDA MD, G  Insurance Card Received 03/12/14 BCBS  AMB Correspondence  03/04/14 DIAB EDU PATIENT LETTER [facility]  Other Photo ID Not Received    Advanced Beneficiary Notice (ABN) Not  Received    E-Signature AOB Spanish Not Received    [place] E-Signature HIPAA Notice of Privacy Received 12/04/14   Insurance Card Received 12/12/14 wmc  [place] HIPAA NOTICE OF PRIVACY - Scanned Received 12/12/14 wmc  AMB Patient Logs/Info  04/07/15   AMB Correspondence  05/06/15 OFFICE NOTE [facility]  AMB Patient Logs/Info  43/15/40   VVS Policy for Pain - E Signature     Insurance Card Received 07/27/15 medicare, bcbs 2017  AMB Correspondence  05/26/15 OFFICE NOTES [facility]  HIM ROI Authorization  08/17/15 pa pp  HIM ROI Authorization  09/28/15 [place]  AMB Correspondence  08/06/15 POC MADDY OTR CHT, L  AMB Outside Hospital Record  10/09/15 D/S [facility]  AMB Correspondence  08/18/15 OFFICE NOTE [facility]  AMB Patient Logs/Info  12/03/15 08/17-09/17  AMB Correspondence  09/03/15 OCCUPATIONAL THERAPY POC MADDY OTR, L  AMB Correspondence  09/03/15 POC MADDY OTR, L  HIM ROI Authorization  01/08/16 [place]  AMB Correspondence  09/18/15 OFFICE NOTES [facility]  AMB Patient Logs/Info  03/09/16   AMB Correspondence  02/12/16 OFFICE NOTE [facility]  AMB Correspondence  02/12/16 OFFICE NOTES [facility]  [place] E-Signature HIPAA Notice of Privacy Signed 04/01/16   AMB Patient Logs/Info  04/05/16   AMB Correspondence  04/13/16 OFFICE NOTE [facility]  AMB Correspondence  10/06/15 PROGRESS NOTE PT AND HAND  AMB Correspondence  10/07/15 END OF CARE PT AND  HAND  AMB Correspondence  06/13/16 [facility]  AMB Correspondence  06/13/16 OFFICE VISIT  AMB Correspondence  04/13/16 [facility]  AMB Correspondence  06/13/16 [facility]  HIM ROI Authorization  08/15/16 PA-KDT  Insurance Card Received 08/16/16 NEW MEDICARE 2018  HIM ROI Authorization  08/24/16 [facility]  HIM ROI Authorization  08/24/16 [place]  AMB  Patient Logs/Info  08/30/16   HIM ROI Authorization  09/05/16 [facility]  AMB Correspondence  08/16/16 [facility]  AMB Patient Logs/Info  10/16/16   AMB Correspondence  10/17/16 [facility]  HIM ROI Authorization  12/01/16 [facility]  AMB Correspondence Received 12/22/16  [facility]  AMB Correspondence (Deleted) 09/29/15 POC Trula Slade MD, V  Documents for the Encounter  AOB (Assignment of Insurance Benefits) Not Received    E-signature AOB Signed 02/10/17   MEDICARE RIGHTS Not Received    E-signature Medicare Rights Signed 02/10/17   ED Patient Billing Extract   ED PB Billing Extract  Cardiac Monitoring Strip Shift Summary Received 02/10/17   Cardiac Monitoring Strip Received 02/11/17   EKG Received 02/12/17   Admission Information   Attending Provider Admitting Provider Admission Type Admission Date/Time  Louellen Molder, MD Louellen Molder, MD Emergency 02/10/17 1146  Discharge Date Hospital Service Auth/Cert Status Service Area   Internal Medicine Incomplete [place]  Unit Room/Bed Admission Status   AP-DEPT 300 A305/A305-01 Admission (Confirmed)   Admission   Complaint  DIALYSIS [facility] Account   Name Acct ID Class Status Primary Coverage  Alaisha, [place] [id number] Inpatient Open BLUE CROSS BLUE SHIELD - BCBS OTHER      Guarantor Account (for Hospital Account [id number])   Name Relation to Pt Service Area Active? Acct Type  Sharlot Gowda Self [facility]SA Yes Personal/Family  Address [place] [address] [phone])        Coverage Information (for Hospital Account [id number])   1. BLUE CROSS BLUE SHIELD/BCBS OTHER   F/O Payor/Plan Precert #  BLUE CROSS BLUE SHIELD/BCBS OTHER   Subscriber Subscriber #  Annelies, [place] [id number]  Address Phone  [address] [place], [place] [address] [phone]  2. [place] PART A AND  B   F/O Payor/Plan Precert #  MEDICARE/MEDICARE PART A AND B   Subscriber Subscriber #  Taheerah, [place] [id number]  Address Phone  [address] [place], [place] [address]

## 2017-02-14 NOTE — Progress Notes (Addendum)
PROGRESS NOTE                                                                                                                                                                                                             Patient Demographics:    [name], is a 71 y.o. female, DOB - 1945/10/25, JGO:[id number]  Admit date - 02/10/2017   Admitting Physician Louellen Molder, MD  Outpatient Primary MD for the patient is [name], MD  LOS - 4  Outpatient Specialists:Nephrology  Chief Complaint  Patient presents with  . Weakness       Brief Narrative   71 year old female with ischemic Cardiomyopathy, coronary artery disease with history of STEMI in 2017 stent placement, ESRD on dialysis (recently started, tu, th, sat  Schedule), Hypertension, hyperlipidemia, type 2 diabetes mellitus, A. Fib on anticoagulation presented with generalized weakness for past few weeks. She was hospitalized 2 weeks back for hypoglycemia secondary to poor by mouth intake and her insulin was stopped. Her INR also has been supratherapeutic for last several days and Her Coumadin was stopped about 10 days back When INR was checked during dialysis. Patient recently lost her husband and has been feeling low with poor appetite and generalized weakness. Her last dialysis session was also incomplete as patient reached late for her session.Despite stopping the Coumadin her INR Did not return to normal. She had her INR checked on 12/7 and was found to be >10. Patient was sent to the ED. Vitals in the ED was stable but had elevated troponin 1.68.Cardiology was consulted who recommended this was On likely due to ACS.Patient admitted for further management. She reports feeling weak and lightheaded. Denies any fever, chills, headache, blurred vision, nausea, vomiting, chest pain, palpitations, orthopnea, PND, abdominal pain, dysuria (Makes little urine) or diarrhea.  Denies any muscle or joint aches. Patient admitted for further management.   Subjective:   Left knee pain better but Right elbow Again started to hurt.. INR again supratherapeutic after receiving a dose of 100 yesterday.  Assessment  & Plan :    Principal Problem:   Supratherapeutic INR No signs of bleeding. INR greater than 10 despite patient being off Coumadin for almost 10 days. Received 2 units FFP with dialysis and finally became therapeutic 3 days into hospitalization. She is on amiodarone and also has poor by  mouth intake both can Keep INR elevated. No other drug interaction. Also had mild transaminitis on admission labs.  Patient received 5 mg Coumadin yesterday (after INR therapeutic) INR again jumped to 8.5. Ordered another 2 unit if the fever dialysis.  Hold Coumadin for now. -Check repeat LFTs and DIC panel. -Her CHADS2vasc score is 6 With high risk of stroke without anticoagulation. -If workup negative, Would plan to restart her Coumadin at a very low dose once INR therapeutic.  Active Problems: ? DIC Patient's DIC panel does show elevated PTT, D-dimer and low platelets. Also has transaminitis. Will consult hematology.  Elevated troponin Possibly associated with ESRD. No EKG changes or chest pain symptoms.Troponin peaked at 1.71. Cardiology consult appreciated. 2-D echo from 3 months back with EF of 35 and 35% and grade 2 diastolic dysfunction. Also showed mid inferior and basal mid inferior lateral hypokinesis. Continue current home medications.  ESRD on hemodialysis Received HD on 12/8. Dialysis today. Nephrology following.  Atrial fibrillation Rate controlled. Coumadin held for Supratherapeutic INR again.Continue amiodarone.  Coronary artery disease Continue Plavix and statin.   Hyperglycemia With type 2 diabetes mellitus Blood glucose 400 in the ED. . CBG now in high 200s. Nursing reports that patient has been refusing her Lantus for last 2 days.  Ischemic  Cardiomyopathy No signs of volume overload. Management with dialysis.    Sacral decubitus ulcer stage II, Leg ulcer Care per nursing, frequent repositioning in bed. Has left shin split , no bleeding or infection, clean eschar over left heel, small deep tissue injury over rt heel. Wound care consulted.  Right elbow and left knee pain. Suspected acute gouty arthritis +/- right elbow bursitis. Continue with oral prednisone and apply Lidoderm patch over right elbow.X-ray unremarkable. If no clinical improvement by tomorrow will need orthopedics consulted for possible steroid injection for rt elbow bursitis.    Failure to thrive in adult Has poor by mouth intake. Nutrition consult appreciated and added supplement. PT recommend SNF.  Depression After losing her husband recently. Offered antidepressants but patient refused.     Code Status :Full code  Family Communication  :None at bedside  Disposition Plan  : SNF tomorrow if INR improved  Barriers For Discharge : Active symptoms  Consults  :  Nephrology, cardiology  Procedures  : None  DVT Prophylaxis  :  Supratherapeutic INR  Lab Results  Component Value Date   PLT 123 (L) 02/14/2017    Antibiotics  :    Anti-infectives (From admission, onward)   None        Objective:   Vitals:   02/13/17 2056 02/13/17 2118 02/13/17 2119 02/14/17 0638  BP: (!) 116/92   (!) 106/46  Pulse: 70 68  63  Resp: 18 16  18   Temp: 98.4 F (36.9 C)   98.4 F (36.9 C)  TempSrc: Oral   Oral  SpO2: 92% 98% 98% 96%  Weight:    91.7 kg (202 lb 1.6 oz)  Height:        Wt Readings from Last 3 Encounters:  02/14/17 91.7 kg (202 lb 1.6 oz)  02/02/17 91.9 kg (202 lb 9.6 oz)  01/17/17 83.3 kg (183 lb 10.3 oz)    No intake or output data in the 24 hours ending 02/14/17 1141   Physical Exam Gen.: Elderly female not in distress, appears fatigued HEENT: Pallor present,Moist mucosa, supple neck Chest: Clear to auscultation bilaterally CVS:  Normal S1 and S2, no murmurs GI: Soft, nondistended, nontender Muscular scheduled to:  Warm, no edema, left knee tenderness improved, as tenderness on the lateral aspect of right elbow, stage II sacral decubitus ulcer, left. Ulcer with eschar tissue and deep tissue injury over right heel.      Data Review:    CBC Recent Labs  Lab 02/10/17 1243 02/11/17 0459 02/11/17 2055 02/12/17 0414 02/14/17 0527  WBC 7.2 6.1 6.9 6.5 6.4  HGB 10.5* 9.9* 10.0* 10.0* 10.4*  HCT 36.1 33.1* 33.5* 33.9* 35.4*  PLT 174 163 121* 145* 123*  MCV 96.0 95.4 95.2 96.3 94.9  MCH 27.9 28.5 28.4 28.4 27.9  MCHC 29.1* 29.9* 29.9* 29.5* 29.4*  RDW 20.8* 20.8* 20.8* 21.3* 21.1*  LYMPHSABS 0.5* 0.7  --   --   --   MONOABS 0.3 0.4  --   --   --   EOSABS 0.0 0.0  --   --   --   BASOSABS 0.0 0.0  --   --   --     Chemistries  Recent Labs  Lab 02/10/17 1243 02/11/17 0459 02/11/17 1500 02/12/17 0414 02/14/17 0416  NA 131* 134* 133* 134* 127*  K 4.6 3.6 3.8 4.7 5.5*  CL 91* 98* 95* 95* 90*  CO2 28 27 26 28 23   GLUCOSE 401* 133* 273* 121* 105*  BUN 33* 39* 43* 30* 63*  CREATININE 4.06* 4.56* 4.83* 3.89* 5.11*  CALCIUM 8.9 8.8* 8.7* 8.4* 8.2*  AST 112*  --   --   --  161*  ALT 60*  --   --   --  81*  ALKPHOS 227*  --   --   --  239*  BILITOT 0.7  --   --   --  0.8   ------------------------------------------------------------------------------------------------------------------ No results for input(s): CHOL, HDL, LDLCALC, TRIG, CHOLHDL, LDLDIRECT in the last 72 hours.  Lab Results  Component Value Date   HGBA1C 8.8 (H) 10/31/2016   ------------------------------------------------------------------------------------------------------------------ No results for input(s): TSH, T4TOTAL, T3FREE, THYROIDAB in the last 72 hours.  Invalid input(s): FREET3 ------------------------------------------------------------------------------------------------------------------ No results for input(s):  VITAMINB12, FOLATE, FERRITIN, TIBC, IRON, RETICCTPCT in the last 72 hours.  Coagulation profile Recent Labs  Lab 02/11/17 0459 02/11/17 2055 02/12/17 0414 02/13/17 0529 02/14/17 0527  INR >10.00* 4.51* 4.72* 2.28 7.39*    No results for input(s): DDIMER in the last 72 hours.  Cardiac Enzymes Recent Labs  Lab 02/11/17 2055 02/12/17 0414 02/12/17 1023  TROPONINI 1.09* 1.09* 1.22*   ------------------------------------------------------------------------------------------------------------------    Component Value Date/Time   BNP 2,761.4 (H) 12/23/2016 1855    Inpatient Medications  Scheduled Meds: . ALPRAZolam  0.5 mg Oral QHS  . amiodarone  200 mg Oral Daily  . calcitRIOL  0.5 mcg Oral Daily  . clopidogrel  75 mg Oral Daily  . epoetin (EPOGEN/PROCRIT) injection  4,000 Units Intravenous Q T,Th,Sa-HD  . feeding supplement (NEPRO CARB STEADY)  237 mL Oral BID BM  . feeding supplement (PRO-STAT SUGAR FREE 64)  30 mL Oral BID  . ferrous sulfate  325 mg Oral Q breakfast  . fluticasone  1 spray Each Nare Daily  . insulin aspart  0-15 Units Subcutaneous TID WC  . insulin glargine  36 Units Subcutaneous Q2200  . isosorbide mononitrate  30 mg Oral BID  . latanoprost  1 drop Both Eyes QHS  . loratadine  10 mg Oral Daily  . meclizine  12.5 mg Oral BID  . pantoprazole  40 mg Oral Daily  . predniSONE  40 mg Oral Q breakfast  . rosuvastatin  40 mg Oral QHS  . sodium chloride flush  3 mL Intravenous Q12H  . vitamin A & D   Topical QID  . Warfarin - Pharmacist Dosing Inpatient   Does not apply Q24H   Continuous Infusions: . sodium chloride    . sodium chloride    . sodium chloride     PRN Meds:.sodium chloride, sodium chloride, acetaminophen **OR** acetaminophen, albuterol, alteplase, cyclobenzaprine, heparin, lidocaine (PF), lidocaine-prilocaine, ondansetron **OR** ondansetron (ZOFRAN) IV, pentafluoroprop-tetrafluoroeth, senna-docusate  Micro Results No results found for  this or any previous visit (from the past 240 hour(s)).  Radiology Reports Dg Chest 2 View  Result Date: 02/10/2017 CLINICAL DATA:  Cough and fatigue EXAM: CHEST  2 VIEW COMPARISON:  January 29, 2017 FINDINGS: There remains airspace consolidation in the right base with small pleural effusions bilaterally. There is cardiomegaly with pulmonary vascularity within normal limits. No adenopathy. Central catheter tip is at the cavoatrial junction. No pneumothorax. No evident bone lesions. IMPRESSION: Stable cardiac prominence with bilateral pleural effusions. Suspect superimposed pneumonia right base. Lungs elsewhere clear. Pulmonary vascularity appears within normal limits. No pneumothorax. Central catheter position unchanged. Electronically Signed   By: Lowella Grip III M.D.   On: 02/10/2017 13:58   Dg Elbow 2 Views Right  Result Date: 02/12/2017 CLINICAL DATA:  71 year old female with right elbow pain. EXAM: RIGHT ELBOW - 2 VIEW COMPARISON:  03/04/2004 FINDINGS: Please note, this is a limited evaluation due to suboptimal positioning. There is no evidence of fracture, dislocation, or joint effusion. There is no evidence of arthropathy or other focal bone abnormality. Soft tissues are unremarkable. IMPRESSION: Limited study due to suboptimal positioning. No gross osseous or soft tissue abnormalities. Electronically Signed   By: Kristopher Oppenheim M.D.   On: 02/12/2017 09:32   Dg Knee 1-2 Views Left  Result Date: 02/12/2017 CLINICAL DATA:  71 year old female with left knee pain. EXAM: LEFT KNEE - 1-2 VIEW COMPARISON:  None. FINDINGS: No evidence of fracture, dislocation, or joint effusion. No focal bone abnormality. There are mild degenerative changes with chondrocalcinosis noted. Soft tissues are unremarkable. IMPRESSION: No acute osseous abnormality. Electronically Signed   By: Kristopher Oppenheim M.D.   On: 02/12/2017 09:31   Ir Fluoro Guide Cv Line Right  Result Date: 01/16/2017 CLINICAL DATA:  End-stage  renal disease with current non tunneled temporary hemodialysis catheter. A tunneled hemodialysis catheter is now needed for longer-term use. EXAM: TUNNELED CENTRAL VENOUS HEMODIALYSIS CATHETER PLACEMENT WITH ULTRASOUND AND FLUOROSCOPIC GUIDANCE ANESTHESIA/SEDATION: 2.0 mg IV Versed; 75 mcg IV Fentanyl. Total Moderate Sedation Time:  24  minutes. The patient's level of consciousness and physiologic status were continuously monitored during the procedure by Radiology nursing. MEDICATIONS: 2 g IV Ancef. As antibiotic prophylaxis, Ancef was ordered pre-procedure and administered intravenously within one hour of incision. FLUOROSCOPY TIME:  1 minutes and 12 seconds.  4.0 mGy. PROCEDURE: The procedure, risks, benefits, and alternatives were explained to the patient. Questions regarding the procedure were encouraged and answered. The patient understands and consents to the procedure. A timeout was performed prior to initiating the procedure. The right neck and chest were prepped with chlorhexidine in a sterile fashion, and a sterile drape was applied covering the operative field. Maximum barrier sterile technique with sterile gowns and gloves were used for the procedure. Local anesthesia was provided with 1% lidocaine. After creating a small venotomy incision, a 21 gauge needle was advanced into the right internal jugular vein under direct, real-time ultrasound guidance. Ultrasound image documentation was performed. After securing  guidewire access, an 8 Fr dilator was placed. A J-wire was kinked to measure appropriate catheter length. A Palindrome tunneled hemodialysis catheter measuring 23 cm from tip to cuff was chosen for placement. This was tunneled in a retrograde fashion from the chest wall to the venotomy incision. At the venotomy, serial dilatation was performed and a 16 Fr peel-away sheath was placed over a guidewire. The catheter was then placed through the sheath and the sheath removed. Final catheter  positioning was confirmed and documented with a fluoroscopic spot image. The catheter was aspirated, flushed with saline, and injected with appropriate volume heparin dwells. The venotomy incision was closed with subcutaneous subcuticular 4-0 Vicryl. Dermabond was applied to the incision. The catheter exit site was secured with 0-Prolene retention sutures. COMPLICATIONS: None.  No pneumothorax. FINDINGS: After catheter placement, the tip lies in the right atrium. The catheter aspirates normally and is ready for immediate use. IMPRESSION: Placement of tunneled hemodialysis catheter via the right internal jugular vein. The catheter tip lies in the right atrium. The catheter is ready for immediate use. Electronically Signed   By: Aletta Edouard M.D.   On: 01/16/2017 15:41   Ir US Guide Vasc Access Right  Result Date: 01/16/2017 CLINICAL DATA:  End-stage renal disease with current non tunneled temporary hemodialysis catheter. A tunneled hemodialysis catheter is now needed for longer-term use. EXAM: TUNNELED CENTRAL VENOUS HEMODIALYSIS CATHETER PLACEMENT WITH ULTRASOUND AND FLUOROSCOPIC GUIDANCE ANESTHESIA/SEDATION: 2.0 mg IV Versed; 75 mcg IV Fentanyl. Total Moderate Sedation Time:  24  minutes. The patient's level of consciousness and physiologic status were continuously monitored during the procedure by Radiology nursing. MEDICATIONS: 2 g IV Ancef. As antibiotic prophylaxis, Ancef was ordered pre-procedure and administered intravenously within one hour of incision. FLUOROSCOPY TIME:  1 minutes and 12 seconds.  4.0 mGy. PROCEDURE: The procedure, risks, benefits, and alternatives were explained to the patient. Questions regarding the procedure were encouraged and answered. The patient understands and consents to the procedure. A timeout was performed prior to initiating the procedure. The right neck and chest were prepped with chlorhexidine in a sterile fashion, and a sterile drape was applied covering the  operative field. Maximum barrier sterile technique with sterile gowns and gloves were used for the procedure. Local anesthesia was provided with 1% lidocaine. After creating a small venotomy incision, a 21 gauge needle was advanced into the right internal jugular vein under direct, real-time ultrasound guidance. Ultrasound image documentation was performed. After securing guidewire access, an 8 Fr dilator was placed. A J-wire was kinked to measure appropriate catheter length. A Palindrome tunneled hemodialysis catheter measuring 23 cm from tip to cuff was chosen for placement. This was tunneled in a retrograde fashion from the chest wall to the venotomy incision. At the venotomy, serial dilatation was performed and a 16 Fr peel-away sheath was placed over a guidewire. The catheter was then placed through the sheath and the sheath removed. Final catheter positioning was confirmed and documented with a fluoroscopic spot image. The catheter was aspirated, flushed with saline, and injected with appropriate volume heparin dwells. The venotomy incision was closed with subcutaneous subcuticular 4-0 Vicryl. Dermabond was applied to the incision. The catheter exit site was secured with 0-Prolene retention sutures. COMPLICATIONS: None.  No pneumothorax. FINDINGS: After catheter placement, the tip lies in the right atrium. The catheter aspirates normally and is ready for immediate use. IMPRESSION: Placement of tunneled hemodialysis catheter via the right internal jugular vein. The catheter tip lies in the right atrium. The catheter  is ready for immediate use. Electronically Signed   By: Aletta Edouard M.D.   On: 01/16/2017 15:41   Dg Chest Port 1 View  Result Date: 01/29/2017 CLINICAL DATA:  Increasing weakness after dialysis this afternoon. EXAM: PORTABLE CHEST 1 VIEW COMPARISON:  12/26/2016 FINDINGS: Interval placement of a right central venous catheter with tip over the cavoatrial junction region. No pneumothorax.  Cardiac enlargement with pulmonary vascular congestion. Bilateral pleural effusions with basilar atelectasis. No significant change since previous study. IMPRESSION: Cardiac enlargement, pulmonary vascular congestion, bilateral pleural effusions, and basilar atelectasis similar to previous study. Electronically Signed   By: Lucienne Capers M.D.   On: 01/29/2017 01:09    Time Spent in minutes  25   Roseann Kees M.D on 02/14/2017 at 11:41 AM  Between 7am to 7pm - Pager - [phone]  After 7pm go to [email or web address] - password [facility]  [facility] -  Office  [phone]

## 2017-02-14 NOTE — Progress Notes (Signed)
Hypoglycemic Event  CBG: 47  Treatment: pt was given food, soda. Rechecked 15 minutes later and CBG was 60.    Pt was given D50 69ml    Symptoms: drowsy   Follow-up CBG: Time: 13:15  CBG Result: 82  Possible Reasons for Event: unknown   Comments/MD notified: MD notified     Harriett Sine

## 2017-02-14 NOTE — Plan of Care (Signed)
Pt alert and oriented x4. Able to make needs known. No complaint of pain or distress at this time. Telemetry in place Sr. Foam dressings to bilateral heels, shins and foam to stage II on coccyx. Right chest port for dialysis. Shunt to left are is not mature. Call light within reach, bed in lowest position. Will continue to monitor.

## 2017-02-14 NOTE — Progress Notes (Signed)
ANTICOAGULATION CONSULT NOTE - follow up  Pharmacy Consult for Warfarin Indication: atrial fibrillation  Allergies  Allergen Reactions  . Ace Inhibitors Cough  . Amlodipine Swelling    UNSPECIFIED EDEMA   . Codeine Rash    Patient Measurements: Height: 5\' 8"  (172.7 cm) Weight: 202 lb 1.6 oz (91.7 kg) IBW/kg (Calculated) : 63.9 Heparin Dosing Weight:   Vital Signs: Temp: 98.4 F (36.9 C) (12/11 0638) Temp Source: Oral (12/11 3235) BP: 106/46 (12/11 5732) Pulse Rate: 63 (12/11 2025)  Labs: Recent Labs    02/11/17 1500  02/11/17 2055 02/12/17 0414 02/12/17 1023 02/13/17 0529 02/14/17 0416 02/14/17 0527  HGB  --    < > 10.0* 10.0*  --   --   --  10.4*  HCT  --   --  33.5* 33.9*  --   --   --  35.4*  PLT  --   --  121* 145*  --   --   --  123*  LABPROT  --    < > 42.2* 48.7*  --  24.9*  --  62.5*  INR  --    < > 4.51* 4.72*  --  2.28  --  7.39*  CREATININE 4.83*  --   --  3.89*  --   --  5.11*  --   TROPONINI 1.34*  --  1.09* 1.09* 1.22*  --   --   --    < > = values in this interval not displayed.    Estimated Creatinine Clearance: 11.8 mL/min (A) (by C-G formula based on SCr of 5.11 mg/dL (H)).   Medical History: Past Medical History:  Diagnosis Date  . Acute renal failure superimposed on stage 4 chronic kidney disease ([place]) 07/04/2015  . Allergic rhinitis   . Anemia   . Anxiety   . CAD in native artery    NSTEMI 07/2015 s/p DES to RCA and posterior PDA, PCI 10/2015 with scoring balloon to 85% ISR of distal RCA), known LAD/Cx disease treated medically  . Carotid artery disease ([place])    Mild bilateral carotid disease (1-39% 07/2015)  . Chronic combined systolic and diastolic CHF (congestive heart failure) ([facility])   . CKD (chronic kidney disease), stage IV ([place])   . Constipation   . Essential hypertension   . GERD (gastroesophageal reflux disease)   . History of hysterectomy   . Hyperlipidemia   . Low back pain   . Obesity   . 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 Osteoarthritis   . Overactive bladder   . PVC's (premature ventricular contractions)   . Type 2 diabetes mellitus ([facility])    Medications:  Medications Prior to Admission  Medication Sig Dispense Refill Last Dose  . Insulin Glargine (LANTUS) 100 UNIT/ML Solostar Pen Inject 30 Units into the skin daily at 10 pm.     . insulin lispro (HUMALOG) 100 UNIT/ML KiwkPen Inject 5-11 Units into the skin 3 (three) times daily before meals.     Marland Kitchen albuterol (PROVENTIL HFA;VENTOLIN HFA) 108 (90 Base) MCG/ACT inhaler Inhale 1-2 puffs into the lungs every 6 (six) hours as needed for wheezing or shortness of breath. 1 Inhaler 0 unknnown at prn  . albuterol (PROVENTIL) (2.5 MG/3ML) 0.083% nebulizer solution Take 3 mLs (2.5 mg total) by nebulization every 6 (six) hours as needed for wheezing or shortness of breath. 75 mL 1 unknown at prn  . ALPRAZolam (XANAX) 0.5 MG  tablet Take 0.5 mg by mouth at bedtime.   12/22/2016  . amiodarone (PACERONE) 200 MG tablet Take 1 tablet (200 mg total) by mouth daily. 30 tablet 0   . calcitRIOL (ROCALTROL) 0.5 MCG capsule Take 1 capsule (0.5 mcg total) daily by mouth. 30 capsule 0   . cetirizine (ZYRTEC) 10 MG tablet Take 5 mg by mouth daily.  3 12/23/2016  . clopidogrel (PLAVIX) 75 MG tablet Take 1 tablet (75 mg total) by mouth daily. 30 tablet 0 12/23/2016  . cyclobenzaprine (FLEXERIL) 5 MG tablet Take 5 mg by mouth as needed (lower back pain). osteoporosis with out current pathological fracture for 14 days  0 unknown at prn  . ferrous sulfate 325 (65 FE) MG tablet Take 325 mg by mouth daily with breakfast.    12/23/2016  . fluticasone (FLONASE) 50 MCG/ACT nasal spray Place 1 spray into both nostrils daily.   12/23/2016  . isosorbide mononitrate (IMDUR) 30 MG 24 hr tablet Take 1 tablet (30 mg total) 2 (two) times daily by mouth. 60 tablet 0   . latanoprost (XALATAN) 0.005 % ophthalmic solution Place  1 drop into both eyes at bedtime.    12/22/2016  . meclizine (ANTIVERT) 25 MG tablet Take 12.5 mg by mouth 2 (two) times daily.    12/23/2016  . nitroGLYCERIN (NITROSTAT) 0.4 MG SL tablet Place 1 tablet (0.4 mg total) under the tongue every 5 (five) minutes as needed for chest pain. 25 tablet 2 unknown at prn  . pantoprazole (PROTONIX) 40 MG tablet Take 40 mg by mouth daily.    12/23/2016  . rosuvastatin (CRESTOR) 40 MG tablet Take 1 tablet (40 mg total) by mouth at bedtime. 30 tablet 0 12/22/2016   Assessment: Okay for Protocol, No signs of bleeding. INR high on admission despite she being off Coumadin for last 10 days.  Pt received 2 units FFP with dialysis on 12/8 and INR was then therapeutic.  Coumadin resumed yesterday and now today INR > 7.  Defer Vitamin K orders to MD.   Goal of Therapy:  INR 2-3   Plan:  HOLD coumadin today, allow INR to trend down, defer Vitamin K orders to MD Daily PT/INR Monitor for signs and symptoms of bleeding.   [place] Crane, Ramez Arrona A 02/14/2017,10:47 AM

## 2017-02-14 NOTE — Progress Notes (Signed)
Inpatient Diabetes Program Recommendations  AACE/ADA: New Consensus Statement on Inpatient Glycemic Control (2015)  Target Ranges:  Prepandial:   less than 140 mg/dL      Peak postprandial:   less than 180 mg/dL (1-2 hours)      Critically ill patients:  140 - 180 mg/dL   Results for MAYLEEN, BORRERO (MRN [id number]) as of 02/14/2017 14:52  Ref. Range 02/14/2017 07:24 02/14/2017 12:25 02/14/2017 12:48 02/14/2017 12:58 02/14/2017 13:15  Glucose-Capillary Latest Ref Range: 65 - 99 mg/dL 268 (H) 47 (L) 47 (L) 60 (L) 82   Review of Glycemic Control  Diabetes history: DM2 Outpatient Diabetes medications: Lantus 30 units QHS, Humalog 5-11 units TID Current orders for Inpatient glycemic control: Lantus 36 units QHS, Novolog 0-15 units TID with meals  Inpatient Diabetes Program Recommendations:  Insulin - Basal: In reviewing chart, noted patient did not take any Lantus on 02/12/17 (charted as refused). Patient did recieve Lantus 36 units last night and has experienced hypoglycemia today. Hypoglycemia may be result of  Novolog correction (Novolog 8 units at 8:46 am for glucose of 268 mg/dl at 7:24)  this morning or too much basal on board. Please consider decreasing Lantus to 18 units QHS (based on 91 kg x 0.2 units).  Thanks, Barnie Alderman, RN, MSN, CDE Diabetes Coordinator Inpatient Diabetes Program [phone] (Team Pager from 8am to 5pm)

## 2017-02-14 NOTE — Progress Notes (Signed)
Pt has critical INR 7.39

## 2017-02-14 NOTE — Consult Note (Signed)
[facility] Nurse wound consult note Reason for Consult: Partial thickness wounds to left anterior and posterior lower leg.  Stage 3 pressure injury to left heel, all present on admission.  Anterior and posterior lower leg wounds are consistent with peeling epithelium from blisters.  Edema is minimal today.  Last dialysis was Saturday and is preparing to dialyze at this time.  Wound type: Partial thickness to lower legs and stage 3 left heel, all POA. Deep tissue injury to thoracic spine noted.  Intact maroon discoloration:  2 cm x 2 cm intact Sacrum:  3 cm x 3 cm intact nonblanchable erythema  Silicone dressing in place.   Pressure Injury POA: Yes Measurement: Left anterior lower leg:  2 cm x 2 cm x 0.1 cm peeling epithelium, unapproximated skin flap present.  Attempted to align this as possible.  A moisture retentive dressing is in place.  Left medial-posterior lower leg:  3 cm x 2 cm x 0.1 cm peeling epithelium, edges approximated with noted skin defect.   Left heel:  Peeling epithelium with deeper nonintact center.  4 cm x 3 cm x 0.3 cm (deepest in center), bleeding noted Wound GXQ:JJHE and moist Drainage (amount, consistency, odor) heel bleeding Serosanguinous drainage to lower legs.  Periwound:minimal edema noted to lower legs.  Dressing procedure/placement/frequency:Cleanse blisters to left lower leg and heel with NS and pat dry.  Apply Xeroform gauze to wound bed.  Cover with 4x4 gauze and kerlix/tape.  Change Tuesday/Thursday and Saturday.  Will not follow at this time.  Please re-consult if needed.  Domenic Moras RN BSN [place] Pager [phone]

## 2017-02-15 ENCOUNTER — Ambulatory Visit: Payer: BLUE CROSS/BLUE SHIELD | Admitting: "Endocrinology

## 2017-02-15 ENCOUNTER — Inpatient Hospital Stay (HOSPITAL_COMMUNITY): Payer: BLUE CROSS/BLUE SHIELD

## 2017-02-15 LAB — GLUCOSE, CAPILLARY
Glucose-Capillary: 134 mg/dL — ABNORMAL HIGH (ref 65–99)
Glucose-Capillary: 119 mg/dL — ABNORMAL HIGH (ref 65–99)
Glucose-Capillary: 79 mg/dL (ref 65–99)
Glucose-Capillary: 221 mg/dL — ABNORMAL HIGH (ref 65–99)

## 2017-02-15 LAB — BPAM FFP
Blood Product Expiration Date: 201812162359
Blood Product Expiration Date: 201812162359
ISSUE DATE / TIME: 201812111550
ISSUE DATE / TIME: 201812111633
UNIT TYPE AND RH: 7300
Unit Type and Rh: 7300

## 2017-02-15 LAB — PREPARE FRESH FROZEN PLASMA
UNIT DIVISION: 0
Unit division: 0

## 2017-02-15 LAB — COMPREHENSIVE METABOLIC PANEL
ALBUMIN: 2.6 g/dL — AB (ref 3.5–5.0)
ALT: 78 U/L — ABNORMAL HIGH (ref 14–54)
ANION GAP: 14 (ref 5–15)
AST: 145 U/L — AB (ref 15–41)
Alkaline Phosphatase: 219 U/L — ABNORMAL HIGH (ref 38–126)
BUN: 40 mg/dL — ABNORMAL HIGH (ref 6–20)
CHLORIDE: 94 mmol/L — AB (ref 101–111)
CO2: 24 mmol/L (ref 22–32)
Calcium: 8.6 mg/dL — ABNORMAL LOW (ref 8.9–10.3)
Creatinine, Ser: 3.72 mg/dL — ABNORMAL HIGH (ref 0.44–1.00)
GFR calc Af Amer: 13 mL/min — ABNORMAL LOW (ref >60)
GFR calc non Af Amer: 11 mL/min — ABNORMAL LOW (ref >60)
GLUCOSE: 202 mg/dL — AB (ref 65–99)
POTASSIUM: 4.8 mmol/L (ref 3.5–5.1)
SODIUM: 132 mmol/L — AB (ref 135–145)
Total Bilirubin: 0.8 mg/dL (ref 0.3–1.2)
Total Protein: 7 g/dL (ref 6.5–8.1)

## 2017-02-15 LAB — PROTIME-INR
INR: 4.56
PROTHROMBIN TIME: 42.9 s — AB (ref 11.4–15.2)

## 2017-02-15 LAB — LACTATE DEHYDROGENASE: LDH: 558 U/L — ABNORMAL HIGH (ref 98–192)

## 2017-02-15 MED ORDER — LEVALBUTEROL HCL 0.63 MG/3ML IN NEBU
0.6300 mg | INHALATION_SOLUTION | Freq: Three times a day (TID) | RESPIRATORY_TRACT | Status: DC
  Administered 2017-02-15 – 2017-02-22 (×19): 0.63 mg via RESPIRATORY_TRACT
  Filled 2017-02-15 (×21): qty 3

## 2017-02-15 MED ORDER — POLYETHYLENE GLYCOL 3350 17 G PO PACK
17.0000 g | PACK | Freq: Every day | ORAL | Status: DC
  Administered 2017-02-15 – 2017-02-22 (×6): 17 g via ORAL
  Filled 2017-02-15 (×6): qty 1

## 2017-02-15 MED ORDER — ORAL CARE MOUTH RINSE
15.0000 mL | Freq: Two times a day (BID) | OROMUCOSAL | Status: DC
  Administered 2017-02-15 – 2017-02-22 (×9): 15 mL via OROMUCOSAL

## 2017-02-15 MED ORDER — BISACODYL 5 MG PO TBEC
10.0000 mg | DELAYED_RELEASE_TABLET | Freq: Once | ORAL | Status: AC
  Administered 2017-02-15: 10 mg via ORAL
  Filled 2017-02-15: qty 2

## 2017-02-15 NOTE — Progress Notes (Signed)
PROGRESS NOTE                                                                                                                                                                                                             Patient Demographics:    [name], is a 71 y.o. female, DOB - 01-Jan-1946, JKD:[id number]  Admit date - 02/10/2017   Admitting Physician Louellen Molder, MD  Outpatient Primary MD for the patient is [name], MD  LOS - 5  Outpatient Specialists:Nephrology  Chief Complaint  Patient presents with  . Weakness       Brief Narrative   71 year old female with ischemic Cardiomyopathy, coronary artery disease with history of STEMI in 2017 stent placement, ESRD on dialysis (recently started, tu, th, sat  Schedule), Hypertension, hyperlipidemia, type 2 diabetes mellitus, A. Fib on anticoagulation presented with generalized weakness for past few weeks. She was hospitalized 2 weeks back for hypoglycemia secondary to poor by mouth intake and her insulin was stopped. Her INR also has been supratherapeutic for last several days and Her Coumadin was stopped about 10 days back When INR was checked during dialysis. Patient recently lost her husband and has been feeling low with poor appetite and generalized weakness. Her last dialysis session was also incomplete as patient reached late for her session.Despite stopping the Coumadin her INR Did not return to normal. She had her INR checked on 12/7 and was found to be >10. Patient was sent to the ED. Vitals in the ED was stable but had elevated troponin 1.68.Cardiology was consulted who recommended this was On likely due to ACS.Patient admitted for further management. She reports feeling weak and lightheaded. Denies any fever, chills, headache, blurred vision, nausea, vomiting, chest pain, palpitations, orthopnea, PND, abdominal pain, dysuria (Makes little urine) or diarrhea.  Denies any muscle or joint aches. Patient admitted for further management.   Subjective:   Feels generally weak.  Has not had a bowel movement several days.  Joints are feeling better.  Assessment  & Plan :    Principal Problem:   Supratherapeutic INR No signs of bleeding. INR greater than 10 despite patient being off Coumadin for almost 10 days. Received 2 units FFP with dialysis and finally became therapeutic 3 days into hospitalization. She is on amiodarone and also has poor by mouth intake both can  Keep INR elevated. No other drug interaction. Also had mild transaminitis on admission labs.  Patient received 5 mg Coumadin yesterday (after INR therapeutic) INR again jumped to 8.5. Ordered another 2 unit FFP.  Hold Coumadin for now. -Check repeat LFTs and DIC panel. -Her CHADS2vasc score is 6 With high risk of stroke without anticoagulation. -If workup negative, Would plan to restart her Coumadin at a very low dose once INR therapeutic.  Active Problems: ? DIC Patient's DIC panel does show elevated PTT, D-dimer and low platelets. Also has transaminitis.  Also noted to have schistocytes on smear.  LDH has been requested.  Repeat labs in a.m. hematology evaluation has been requested  Elevated troponin Possibly associated with ESRD. No EKG changes or chest pain symptoms.Troponin peaked at 1.71. Cardiology consult appreciated. 2-D echo from 3 months back with EF of 35 and 35% and grade 2 diastolic dysfunction. Also showed mid inferior and basal mid inferior lateral hypokinesis. Continue current home medications.  ESRD on hemodialysis Nephrology following for dialysis needs.  Atrial fibrillation Rate controlled. Coumadin held for Supratherapeutic INR again.  Coronary artery disease Continue Plavix and statin.   Hyperglycemia With type 2 diabetes mellitus Blood glucose 400 in the ED. CBG now improved. Nursing reports that patient has been refusing her Lantus for last 2  days.  Ischemic Cardiomyopathy No signs of volume overload. Management with dialysis.    Sacral decubitus ulcer stage II, Leg ulcer Care per nursing, frequent repositioning in bed. Has left shin split , no bleeding or infection, clean eschar over left heel, small deep tissue injury over rt heel. Wound care consulted.  Right elbow and left knee pain. Suspected acute gouty arthritis +/- right elbow bursitis. Continue with oral prednisone and apply Lidoderm patch over right elbow.X-ray unremarkable. Improving with steroids    Failure to thrive in adult Has poor by mouth intake. Nutrition consult appreciated and added supplement. PT recommend SNF.  Depression After losing her husband recently. Offered antidepressants but patient refused.     Code Status :Full code  Family Communication  :None at bedside  Disposition Plan  : discharge to SNF when stable  Barriers For Discharge : Active symptoms  Consults  :  Nephrology, cardiology  Procedures  : None  DVT Prophylaxis  :  Supratherapeutic INR  Lab Results  Component Value Date   PLT 113 (L) 02/14/2017    Antibiotics  :    Anti-infectives (From admission, onward)   None        Objective:   Vitals:   02/15/17 0415 02/15/17 0824 02/15/17 1520 02/15/17 1535  BP: 110/71   132/65  Pulse: 73   65  Resp: 20   18  Temp: 98.2 F (36.8 C)   98.5 F (36.9 C)  TempSrc: Oral   Oral  SpO2: 96% 98% 98% 98%  Weight: 91.9 kg (202 lb 9.6 oz)     Height:        Wt Readings from Last 3 Encounters:  02/15/17 91.9 kg (202 lb 9.6 oz)  02/02/17 91.9 kg (202 lb 9.6 oz)  01/17/17 83.3 kg (183 lb 10.3 oz)     Intake/Output Summary (Last 24 hours) at 02/15/2017 1921 Last data filed at 02/15/2017 1700 Gross per 24 hour  Intake 603 ml  Output -  Net 603 ml     Physical Exam Gen.: Elderly female not in distress, appears fatigued HEENT: Pallor present,Moist mucosa, supple neck Chest: Clear to auscultation bilaterally CVS:  Normal S1 and S2,  no murmurs GI: Soft, nondistended, nontender Muscular scheduled to: Warm, no edema, left knee tenderness improved, as tenderness on the lateral aspect of right elbow, stage II sacral decubitus ulcer, left. Ulcer with eschar tissue and deep tissue injury over right heel.      Data Review:    CBC Recent Labs  Lab 02/10/17 1243 02/11/17 0459 02/11/17 2055 02/12/17 0414 02/14/17 0527 02/14/17 1333  WBC 7.2 6.1 6.9 6.5 6.4  --   HGB 10.5* 9.9* 10.0* 10.0* 10.4*  --   HCT 36.1 33.1* 33.5* 33.9* 35.4*  --   PLT 174 163 121* 145* 123* 113*  MCV 96.0 95.4 95.2 96.3 94.9  --   MCH 27.9 28.5 28.4 28.4 27.9  --   MCHC 29.1* 29.9* 29.9* 29.5* 29.4*  --   RDW 20.8* 20.8* 20.8* 21.3* 21.1*  --   LYMPHSABS 0.5* 0.7  --   --   --   --   MONOABS 0.3 0.4  --   --   --   --   EOSABS 0.0 0.0  --   --   --   --   BASOSABS 0.0 0.0  --   --   --   --     Chemistries  Recent Labs  Lab 02/10/17 1243 02/11/17 0459 02/11/17 1500 02/12/17 0414 02/14/17 0416 02/15/17 0420  NA 131* 134* 133* 134* 127* 132*  K 4.6 3.6 3.8 4.7 5.5* 4.8  CL 91* 98* 95* 95* 90* 94*  CO2 28 27 26 28 23 24   GLUCOSE 401* 133* 273* 121* 105* 202*  BUN 33* 39* 43* 30* 63* 40*  CREATININE 4.06* 4.56* 4.83* 3.89* 5.11* 3.72*  CALCIUM 8.9 8.8* 8.7* 8.4* 8.2* 8.6*  AST 112*  --   --   --  161* 145*  ALT 60*  --   --   --  81* 78*  ALKPHOS 227*  --   --   --  239* 219*  BILITOT 0.7  --   --   --  0.8 0.8   ------------------------------------------------------------------------------------------------------------------ No results for input(s): CHOL, HDL, LDLCALC, TRIG, CHOLHDL, LDLDIRECT in the last 72 hours.  Lab Results  Component Value Date   HGBA1C 8.8 (H) 10/31/2016   ------------------------------------------------------------------------------------------------------------------ No results for input(s): TSH, T4TOTAL, T3FREE, THYROIDAB in the last 72 hours.  Invalid input(s):  FREET3 ------------------------------------------------------------------------------------------------------------------ No results for input(s): VITAMINB12, FOLATE, FERRITIN, TIBC, IRON, RETICCTPCT in the last 72 hours.  Coagulation profile Recent Labs  Lab 02/12/17 0414 02/13/17 0529 02/14/17 0527 02/14/17 1333 02/15/17 0420  INR 4.72* 2.28 7.39* REPEATED TO VERIFY 4.56*    Recent Labs    02/14/17 1333  DDIMER 0.79*    Cardiac Enzymes Recent Labs  Lab 02/11/17 2055 02/12/17 0414 02/12/17 1023  TROPONINI 1.09* 1.09* 1.22*   ------------------------------------------------------------------------------------------------------------------    Component Value Date/Time   BNP 2,761.4 (H) 12/23/2016 1855    Inpatient Medications  Scheduled Meds: . ALPRAZolam  0.5 mg Oral QHS  . calcitRIOL  0.5 mcg Oral Daily  . clopidogrel  75 mg Oral Daily  . epoetin (EPOGEN/PROCRIT) injection  4,000 Units Intravenous Q T,Th,Sa-HD  . feeding supplement (NEPRO CARB STEADY)  237 mL Oral BID BM  . feeding supplement (PRO-STAT SUGAR FREE 64)  30 mL Oral BID  . ferrous sulfate  325 mg Oral Q breakfast  . fluticasone  1 spray Each Nare Daily  . insulin aspart  0-15 Units Subcutaneous TID WC  . insulin glargine  28 Units Subcutaneous  Q2200  . isosorbide mononitrate  30 mg Oral BID  . latanoprost  1 drop Both Eyes QHS  . levalbuterol  0.63 mg Nebulization TID  . lidocaine  1 patch Transdermal Q24H  . loratadine  10 mg Oral Daily  . meclizine  12.5 mg Oral BID  . mouth rinse  15 mL Mouth Rinse BID  . pantoprazole  40 mg Oral Daily  . polyethylene glycol  17 g Oral Daily  . predniSONE  40 mg Oral Q breakfast  . rosuvastatin  40 mg Oral QHS  . sodium chloride flush  3 mL Intravenous Q12H  . vitamin A & D   Topical QID  . Warfarin - Pharmacist Dosing Inpatient   Does not apply Q24H   Continuous Infusions: . sodium chloride    . sodium chloride    . sodium chloride    . sodium  chloride    . sodium chloride     PRN Meds:.sodium chloride, sodium chloride, sodium chloride, sodium chloride, acetaminophen **OR** acetaminophen, albuterol, alteplase, cyclobenzaprine, heparin, lidocaine (PF), lidocaine-prilocaine, ondansetron **OR** ondansetron (ZOFRAN) IV, pentafluoroprop-tetrafluoroeth, senna-docusate  Micro Results No results found for this or any previous visit (from the past 240 hour(s)).  Radiology Reports Dg Chest 2 View  Result Date: 02/10/2017 CLINICAL DATA:  Cough and fatigue EXAM: CHEST  2 VIEW COMPARISON:  January 29, 2017 FINDINGS: There remains airspace consolidation in the right base with small pleural effusions bilaterally. There is cardiomegaly with pulmonary vascularity within normal limits. No adenopathy. Central catheter tip is at the cavoatrial junction. No pneumothorax. No evident bone lesions. IMPRESSION: Stable cardiac prominence with bilateral pleural effusions. Suspect superimposed pneumonia right base. Lungs elsewhere clear. Pulmonary vascularity appears within normal limits. No pneumothorax. Central catheter position unchanged. Electronically Signed   By: Lowella Grip III M.D.   On: 02/10/2017 13:58   Dg Elbow 2 Views Right  Result Date: 02/12/2017 CLINICAL DATA:  71 year old female with right elbow pain. EXAM: RIGHT ELBOW - 2 VIEW COMPARISON:  03/04/2004 FINDINGS: Please note, this is a limited evaluation due to suboptimal positioning. There is no evidence of fracture, dislocation, or joint effusion. There is no evidence of arthropathy or other focal bone abnormality. Soft tissues are unremarkable. IMPRESSION: Limited study due to suboptimal positioning. No gross osseous or soft tissue abnormalities. Electronically Signed   By: Kristopher Oppenheim M.D.   On: 02/12/2017 09:32   Dg Knee 1-2 Views Left  Result Date: 02/12/2017 CLINICAL DATA:  71 year old female with left knee pain. EXAM: LEFT KNEE - 1-2 VIEW COMPARISON:  None. FINDINGS: No evidence of  fracture, dislocation, or joint effusion. No focal bone abnormality. There are mild degenerative changes with chondrocalcinosis noted. Soft tissues are unremarkable. IMPRESSION: No acute osseous abnormality. Electronically Signed   By: Kristopher Oppenheim M.D.   On: 02/12/2017 09:31   US Abdomen Complete  Result Date: 02/15/2017 CLINICAL DATA:  Transaminitis EXAM: ABDOMEN ULTRASOUND COMPLETE COMPARISON:  CT abdomen and pelvis March 20, 2016 FINDINGS: Gallbladder: Within the gallbladder, there are multiple echogenic foci which move and shadow consistent with cholelithiasis. Largest gallstone measures 7 mm in length. In addition, there is a 3 mm echogenic focus which neither moves nor shadows, a presumed small polyp. Gallbladder wall is upper normal in thickness without edema or pericholecystic fluid. No sonographic Murphy sign noted by sonographer. Common bile duct: Diameter: 4 mm. No intrahepatic, common hepatic, or common bile duct dilatation. Liver: No focal lesion identified. Within normal limits in parenchymal echogenicity. Portal vein is  patent on color Doppler imaging with normal direction of blood flow towards the liver. IVC: No abnormality visualized. Pancreas: Visualized portion unremarkable. Portions of the pancreas are obscured by gas. Spleen: Size and appearance within normal limits. Right Kidney: Length: 10.9 cm. Echogenicity within normal limits. No hydronephrosis visualized. There is a cyst in the mid right kidney measuring 1.2 x 0.8 x 0.9 cm Left Kidney: Length: 11.1 cm. Echogenicity within normal limits. No mass or hydronephrosis visualized. Abdominal aorta: No aneurysm visualized. Other findings: No demonstrable ascites. There are pleural effusions bilaterally. IMPRESSION: 1. Cholelithiasis. There is also a 3 mm polyp within the gallbladder. 2. Portions of pancreas obscured by gas. Visualized portions of pancreas appear normal. 3.  Small cyst in mid right kidney. 4.  Pleural effusions  bilaterally. Electronically Signed   By: Lowella Grip III M.D.   On: 02/15/2017 15:43   Dg Chest Port 1 View  Result Date: 01/29/2017 CLINICAL DATA:  Increasing weakness after dialysis this afternoon. EXAM: PORTABLE CHEST 1 VIEW COMPARISON:  12/26/2016 FINDINGS: Interval placement of a right central venous catheter with tip over the cavoatrial junction region. No pneumothorax. Cardiac enlargement with pulmonary vascular congestion. Bilateral pleural effusions with basilar atelectasis. No significant change since previous study. IMPRESSION: Cardiac enlargement, pulmonary vascular congestion, bilateral pleural effusions, and basilar atelectasis similar to previous study. Electronically Signed   By: Lucienne Capers M.D.   On: 01/29/2017 01:09    Time Spent in minutes  25   Kathie Dike M.D on 02/15/2017 at 7:21 PM  Between 7am to 7pm - Pager [phone]  After 7pm go to [email or web address] - password [facility]  [facility] -  Office  [phone]

## 2017-02-15 NOTE — Progress Notes (Addendum)
Physical Therapy Treatment Patient Details Name: [name] MRN: [id number] DOB: 03-Aug-1945 Today's Date: 02/15/2017    History of Present Illnessatricia P [name] is a 71 y.o. female with a history of ischemic cardiomyopathy with an STEMI in 2017 with stent placements, end-stage renal disease on dialysis (recent start of dialysis), coronary artery disease, chronic combined systolic and diastolic heart failure, hypertension, GERD, hyperlipidemia, type 2 diabetes.  Patient has been feeling weak and tired over the past few weeks.  She was recently hospitalized at the end of November due to hypoglycemia secondary to insulin use and poor p.o. intake.  Her poor p.o. intake is secondary to her husband passing away last month.  Her insulin was stopped.  Her INR has been elevated over the past several days and, according to the patient, her Coumadin was stopped about a week ago.  Her INR has continued to be elevated and has not returned to normal levels.  Today, she was instructed to go to the radiology office for an INR, which was greater than 10.  Repeat in the emergency department was the same.  Laboratory data shows elevated troponin at 1.68.       PT Comments    PT continues to be very weak.  Unable to stand today but was able to sit on edge of bed for over 5 minutes.  PT has not eaten due to testing which might explain decreased ability today   Follow Up Recommendations   SNF     Equipment Recommendations    none recommended   Recommendations for Other Services  OT      Precautions / Restrictions Precautions Precautions: Fall Restrictions Weight Bearing Restrictions: No    Mobility  Bed Mobility Overal bed mobility: Needs Assistance Bed Mobility: Rolling Rolling: Mod assist Sidelying to sit: Mod assist Supine to sit: Mod assist     General bed mobility comments: Sat at edge of bed x 5 minutes   Transfers     Transfers: Sit to/from Stand           General  transfer comment: unable at this treatment session  Ambulation/Gait  unable at this time.                     Cognition Arousal/Alertness: Awake/alert Behavior During Therapy: WFL for tasks assessed/performed(fatigued with sitting activity) Overall Cognitive Status: Within Functional Limits for tasks assessed                                        Exercises General Exercises - Lower Extremity Ankle Circles/Pumps: Both;Supine;10 reps Quad Sets: Both;Supine;10 reps Gluteal Sets: Both;Supine;10 reps Long Arc Quad: Both;Seated;10 reps Heel Slides: Both;Supine;10 reps Hip ABduction/ADduction: Both;Supine;10 reps    General Comments        Pertinent Vitals/Pain Pain Assessment: No/denies pain    Home Living    Lives with daughter at this time.                   Prior Function   Pt states that she has not walked for quite a while          PT Goals (current goals can now be found in the care plan section) Progress towards PT goals: Not progressing toward goals - comment    Frequency     Continue TIW      PT Plan  See for exercise  and increased mobility     Co-evaluation              AM-PAC PT "6 Clicks" Daily Activity  Outcome Measure                   End of Session               Time: 8864-8472 PT Time Calculation (min) (ACUTE ONLY): 35 min  Charges:  $Therapeutic Exercise: 8-22 mins $Therapeutic Activity: 8-22 mins                    G CodesRayetta Humphrey, PT CLT [phone] 02/15/2017, 1:39 PM

## 2017-02-15 NOTE — Progress Notes (Signed)
ANTICOAGULATION CONSULT NOTE - follow up  Pharmacy Consult for Warfarin Indication: atrial fibrillation  Allergies  Allergen Reactions  . Ace Inhibitors Cough  . Amlodipine Swelling    UNSPECIFIED EDEMA   . Codeine Rash   Patient Measurements: Height: 5\' 8"  (172.7 cm) Weight: 202 lb 9.6 oz (91.9 kg) IBW/kg (Calculated) : 63.9  Vital Signs: Temp: 98.2 F (36.8 C) (12/12 0415) Temp Source: Oral (12/12 0415) BP: 110/71 (12/12 0415) Pulse Rate: 73 (12/12 0415)  Labs: Recent Labs    02/12/17 1023  02/14/17 0416 02/14/17 0527 02/14/17 1333 02/15/17 0420  HGB  --   --   --  10.4*  --   --   HCT  --   --   --  35.4*  --   --   PLT  --   --   --  123* 113*  --   APTT  --   --   --   --  144*  --   LABPROT  --    < >  --  62.5* 67.7* 42.9*  INR  --    < >  --  7.39* REPEATED TO VERIFY 4.56*  CREATININE  --   --  5.11*  --   --  3.72*  TROPONINI 1.22*  --   --   --   --   --    < > = values in this interval not displayed.   Estimated Creatinine Clearance: 16.2 mL/min (A) (by C-G formula based on SCr of 3.72 mg/dL (H)).  Medical History: Past Medical History:  Diagnosis Date  . Acute renal failure superimposed on stage 4 chronic kidney disease ([place]) 07/04/2015  . Allergic rhinitis   . Anemia   . Anxiety   . CAD in native artery    NSTEMI 07/2015 s/p DES to RCA and posterior PDA, PCI 10/2015 with scoring balloon to 85% ISR of distal RCA), known LAD/Cx disease treated medically  . Carotid artery disease ([place])    Mild bilateral carotid disease (1-39% 07/2015)  . Chronic combined systolic and diastolic CHF (congestive heart failure) ([place])   . CKD (chronic kidney disease), stage IV ([place])   . Constipation   . Essential hypertension   . GERD (gastroesophageal reflux disease)   . History of hysterectomy   . Hyperlipidemia   . Low back pain   . Obesity   . 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  Osteoarthritis   . Overactive bladder   . PVC's (premature ventricular contractions)   . Type 2 diabetes mellitus (HCC)    Medications:  Medications Prior to Admission  Medication Sig Dispense Refill Last Dose  . Insulin Glargine (LANTUS) 100 UNIT/ML Solostar Pen Inject 30 Units into the skin daily at 10 pm.     . insulin lispro (HUMALOG) 100 UNIT/ML KiwkPen Inject 5-11 Units into the skin 3 (three) times daily before meals.     Marland Kitchen albuterol (PROVENTIL HFA;VENTOLIN HFA) 108 (90 Base) MCG/ACT inhaler Inhale 1-2 puffs into the lungs every 6 (six) hours as needed for wheezing or shortness of breath. 1 Inhaler 0 unknnown at prn  . albuterol (PROVENTIL) (2.5 MG/3ML) 0.083% nebulizer solution Take 3 mLs (2.5 mg total) by nebulization every 6 (six) hours as needed for wheezing or shortness of breath. 75 mL 1 unknown at prn  . ALPRAZolam (XANAX) 0.5 MG tablet Take 0.5 mg by mouth at bedtime.   12/22/2016  .  amiodarone (PACERONE) 200 MG tablet Take 1 tablet (200 mg total) by mouth daily. 30 tablet 0   . calcitRIOL (ROCALTROL) 0.5 MCG capsule Take 1 capsule (0.5 mcg total) daily by mouth. 30 capsule 0   . cetirizine (ZYRTEC) 10 MG tablet Take 5 mg by mouth daily.  3 12/23/2016  . clopidogrel (PLAVIX) 75 MG tablet Take 1 tablet (75 mg total) by mouth daily. 30 tablet 0 12/23/2016  . cyclobenzaprine (FLEXERIL) 5 MG tablet Take 5 mg by mouth as needed (lower back pain). osteoporosis with out current pathological fracture for 14 days  0 unknown at prn  . ferrous sulfate 325 (65 FE) MG tablet Take 325 mg by mouth daily with breakfast.    12/23/2016  . fluticasone (FLONASE) 50 MCG/ACT nasal spray Place 1 spray into both nostrils daily.   12/23/2016  . isosorbide mononitrate (IMDUR) 30 MG 24 hr tablet Take 1 tablet (30 mg total) 2 (two) times daily by mouth. 60 tablet 0   . latanoprost (XALATAN) 0.005 % ophthalmic solution Place 1 drop into both eyes at bedtime.    12/22/2016  . meclizine (ANTIVERT) 25 MG tablet  Take 12.5 mg by mouth 2 (two) times daily.    12/23/2016  . nitroGLYCERIN (NITROSTAT) 0.4 MG SL tablet Place 1 tablet (0.4 mg total) under the tongue every 5 (five) minutes as needed for chest pain. 25 tablet 2 unknown at prn  . pantoprazole (PROTONIX) 40 MG tablet Take 40 mg by mouth daily.    12/23/2016  . rosuvastatin (CRESTOR) 40 MG tablet Take 1 tablet (40 mg total) by mouth at bedtime. 30 tablet 0 12/22/2016   Assessment: No signs of bleeding. INR high on admission despite she being off Coumadin for last 10 days.  Pt received 2 units FFP with dialysis on 12/8 and again on 12/11.   INR was therapeutic on 12/9 after FFP.  Coumadin dose given on 12/9 and then INR > 7 again.  Defer Vitamin K orders to MD.  INR today is 4.59.  Abnormal DIC panel noted.  Continue to hold Warfarin  Goal of Therapy:  INR 2-3   Plan:  HOLD coumadin today, allow INR to trend down, defer Vitamin K orders to MD Consider Hematology consult Daily PT/INR Monitor for signs and symptoms of bleeding.   [place] Crane, Renan Danese A 02/15/2017,9:00 AM

## 2017-02-15 NOTE — Progress Notes (Signed)
CRITICAL VALUE ALERT  Critical Value:  INR 4.56  Date & Time Notied:  02/15/17 9201  Provider Notified: Olevia Bowens, MD  Orders Received/Actions taken: No new orders as of yet.

## 2017-02-15 NOTE — Progress Notes (Addendum)
[name]  MRN: [id number]  DOB/AGE: 72-20-1947 72 y.o.  Primary Care Physician:Hill, Berneta Sages, MD  Admit date: 02/10/2017  Chief Complaint:  Chief Complaint  Patient presents with  . Weakness    S-Pt presented on  02/10/2017 with  Chief Complaint  Patient presents with  . Weakness  .    Pt today feels better.Pt main concern was " Why am I going for stomach ultrasound?"   Meds  . ALPRAZolam  0.5 mg Oral QHS  . amiodarone  200 mg Oral Daily  . calcitRIOL  0.5 mcg Oral Daily  . clopidogrel  75 mg Oral Daily  . epoetin (EPOGEN/PROCRIT) injection  4,000 Units Intravenous Q T,Th,Sa-HD  . feeding supplement (NEPRO CARB STEADY)  237 mL Oral BID BM  . feeding supplement (PRO-STAT SUGAR FREE 64)  30 mL Oral BID  . ferrous sulfate  325 mg Oral Q breakfast  . fluticasone  1 spray Each Nare Daily  . insulin aspart  0-15 Units Subcutaneous TID WC  . insulin glargine  28 Units Subcutaneous Q2200  . isosorbide mononitrate  30 mg Oral BID  . latanoprost  1 drop Both Eyes QHS  . levalbuterol  0.63 mg Nebulization TID  . lidocaine  1 patch Transdermal Q24H  . loratadine  10 mg Oral Daily  . meclizine  12.5 mg Oral BID  . mouth rinse  15 mL Mouth Rinse BID  . pantoprazole  40 mg Oral Daily  . predniSONE  40 mg Oral Q breakfast  . rosuvastatin  40 mg Oral QHS  . sodium chloride flush  3 mL Intravenous Q12H  . vitamin A & D   Topical QID  . Warfarin - Pharmacist Dosing Inpatient   Does not apply Q24H       Physical Exam: Vital signs in last 24 hours: Temp:  [98.2 F (36.8 C)-99.1 F (37.3 C)] 98.2 F (36.8 C) (12/12 0415) Pulse Rate:  [63-81] 73 (12/12 0415) Resp:  [18-20] 20 (12/12 0415) BP: (105-129)/(42-71) 110/71 (12/12 0415) SpO2:  [94 %-100 %] 98 % (12/12 0824) Weight:  [202 lb 2.6 oz (91.7 kg)-202 lb 9.6 oz (91.9 kg)] 202 lb 9.6 oz (91.9 kg) (12/12 0415) Weight change: 1 oz (0.028 kg) Last BM Date: 02/13/17  Intake/Output from previous day: 12/11 0701 - 12/12  0700 In: 830 [P.O.:120; I.V.:3; Blood:707] Out: 1533  Total I/O In: 240 [P.O.:240] Out: -    Physical Exam: General- pt is awake,alert, oriented to time place and person Resp- No acute REsp distress,Minimal  R[place]chi CVS- S1S2 regular in rate and rhythm GIT- BS+, soft, NT, ND EXT- 1+ LE Edema, NO Cyanosis Access-PC in situ  Lab Results: CBC Recent Labs    02/14/17 0527 02/14/17 1333  WBC 6.4  --   HGB 10.4*  --   HCT 35.4*  --   PLT 123* 113*    BMET Recent Labs    02/14/17 0416 02/15/17 0420  NA 127* 132*  K 5.5* 4.8  CL 90* 94*  CO2 23 24  GLUCOSE 105* 202*  BUN 63* 40*  CREATININE 5.11* 3.72*  CALCIUM 8.2* 8.6*    MICRO No results found for this or any previous visit (from the past 240 hour(s)).    Lab Results  Component Value Date   PTH 270 (H) 12/27/2016   CALCIUM 8.6 (L) 02/15/2017   PHOS 5.2 (H) 02/14/2017      Impression: 1)Renal  ESRD on HD  Pt is on Tuesday/Thursday/Saturday schedule                Pt was last dialyzed yesterday                No need of HD today  2)HTN On Vasodilators.   3)Anemia In ESRD the goal for HGb is 9--11. hgb at goal  4)CKD Mineral-Bone Disorder PTH acceptable. Secondary Hyperparathyroidismpresent.. Phosphorus at goal.   5)CVS-admitted with Elevated trops Has hx of Afib PMD following  6)Electrolytes  Hyperkalemic    Sec to ESRD     better  Hyponatremic    Sec to ESRD    better  7)Acid base Co2 at goal   8) Pharma-Admitted with supra therapeutic INR    Primary team and Pharmacy following  Plan:   Will continue current care Will dialyze in am Will use 2k bath Will keep on epo  Shemicka Cohrs S 02/15/2017, 11:16 AM

## 2017-02-16 DIAGNOSIS — D696 Thrombocytopenia, unspecified: Secondary | ICD-10-CM

## 2017-02-16 DIAGNOSIS — R740 Nonspecific elevation of levels of transaminase and lactic acid dehydrogenase [LDH]: Secondary | ICD-10-CM

## 2017-02-16 DIAGNOSIS — R8290 Unspecified abnormal findings in urine: Secondary | ICD-10-CM

## 2017-02-16 DIAGNOSIS — F439 Reaction to severe stress, unspecified: Secondary | ICD-10-CM

## 2017-02-16 LAB — CBC
HCT: 32.8 % — ABNORMAL LOW (ref 36.0–46.0)
Hemoglobin: 9.6 g/dL — ABNORMAL LOW (ref 12.0–15.0)
MCH: 27.8 pg (ref 26.0–34.0)
MCHC: 29.3 g/dL — ABNORMAL LOW (ref 30.0–36.0)
MCV: 95.1 fL (ref 78.0–100.0)
PLATELETS: 105 10*3/uL — AB (ref 150–400)
RBC: 3.45 MIL/uL — AB (ref 3.87–5.11)
RDW: 21.5 % — ABNORMAL HIGH (ref 11.5–15.5)
WBC: 5.4 10*3/uL (ref 4.0–10.5)

## 2017-02-16 LAB — BASIC METABOLIC PANEL
ANION GAP: 14 (ref 5–15)
BUN: 55 mg/dL — ABNORMAL HIGH (ref 6–20)
CALCIUM: 8.8 mg/dL — AB (ref 8.9–10.3)
CO2: 24 mmol/L (ref 22–32)
CREATININE: 4.7 mg/dL — AB (ref 0.44–1.00)
Chloride: 94 mmol/L — ABNORMAL LOW (ref 101–111)
GFR, EST AFRICAN AMERICAN: 10 mL/min — AB (ref >60)
GFR, EST NON AFRICAN AMERICAN: 8 mL/min — AB (ref >60)
Glucose, Bld: 139 mg/dL — ABNORMAL HIGH (ref 65–99)
Potassium: 6 mmol/L — ABNORMAL HIGH (ref 3.5–5.1)
SODIUM: 132 mmol/L — AB (ref 135–145)

## 2017-02-16 LAB — DIC (DISSEMINATED INTRAVASCULAR COAGULATION)PANEL
D-Dimer, Quant: 0.68 ug/mL-FEU — ABNORMAL HIGH (ref 0.00–0.50)
Fibrinogen: 650 mg/dL — ABNORMAL HIGH (ref 210–475)
Platelets: 98 10*3/uL — ABNORMAL LOW (ref 150–400)
Prothrombin Time: 45.7 seconds — ABNORMAL HIGH (ref 11.4–15.2)

## 2017-02-16 LAB — CBC WITH DIFFERENTIAL/PLATELET
BASOS ABS: 0 10*3/uL (ref 0.0–0.1)
BASOS PCT: 0 %
EOS ABS: 0 10*3/uL (ref 0.0–0.7)
EOS PCT: 0 %
HCT: 32.8 % — ABNORMAL LOW (ref 36.0–46.0)
Hemoglobin: 9.7 g/dL — ABNORMAL LOW (ref 12.0–15.0)
Lymphocytes Relative: 17 %
Lymphs Abs: 0.9 10*3/uL (ref 0.7–4.0)
MCH: 28.1 pg (ref 26.0–34.0)
MCHC: 29.6 g/dL — ABNORMAL LOW (ref 30.0–36.0)
MCV: 95.1 fL (ref 78.0–100.0)
MONO ABS: 0.3 10*3/uL (ref 0.1–1.0)
Monocytes Relative: 5 %
NEUTROS ABS: 4.1 10*3/uL (ref 1.7–7.7)
Neutrophils Relative %: 78 %
PLATELETS: 98 10*3/uL — AB (ref 150–400)
RBC: 3.45 MIL/uL — ABNORMAL LOW (ref 3.87–5.11)
RDW: 21.2 % — AB (ref 11.5–15.5)
WBC: 5.3 10*3/uL (ref 4.0–10.5)

## 2017-02-16 LAB — GLUCOSE, CAPILLARY
GLUCOSE-CAPILLARY: 136 mg/dL — AB (ref 65–99)
GLUCOSE-CAPILLARY: 98 mg/dL (ref 65–99)
Glucose-Capillary: 90 mg/dL (ref 65–99)
Glucose-Capillary: 98 mg/dL (ref 65–99)
Glucose-Capillary: 70 mg/dL (ref 65–99)
Glucose-Capillary: 47 mg/dL — ABNORMAL LOW (ref 65–99)

## 2017-02-16 LAB — DIC (DISSEMINATED INTRAVASCULAR COAGULATION) PANEL
APTT: 72 s — AB (ref 24–36)
INR: 4.95 — AB
SMEAR REVIEW: NONE SEEN

## 2017-02-16 LAB — ALBUMIN: Albumin: 2.6 g/dL — ABNORMAL LOW (ref 3.5–5.0)

## 2017-02-16 LAB — VITAMIN B12: Vitamin B-12: 2812 pg/mL — ABNORMAL HIGH (ref 180–914)

## 2017-02-16 LAB — LACTATE DEHYDROGENASE: LDH: 583 U/L — AB (ref 98–192)

## 2017-02-16 LAB — PHOSPHORUS: PHOSPHORUS: 6.1 mg/dL — AB (ref 2.5–4.6)

## 2017-02-16 MED ORDER — PENTAFLUOROPROP-TETRAFLUOROETH EX AERO: 1.0000 "application " | INHALATION_SPRAY | CUTANEOUS | Status: DC | PRN

## 2017-02-16 MED ORDER — DEXTROSE 50 % IV SOLN
50.0000 mL | Freq: Once | INTRAVENOUS | Status: AC
Start: 2017-02-16 — End: 2017-02-16
  Administered 2017-02-16: 50 mL via INTRAVENOUS

## 2017-02-16 MED ORDER — SODIUM CHLORIDE 0.9 % IV SOLN: 100.0000 mL | INTRAVENOUS | Status: DC | PRN

## 2017-02-16 MED ORDER — MILK AND MOLASSES ENEMA: 1.0000 | Freq: Once | RECTAL | Status: DC

## 2017-02-16 MED ORDER — LIDOCAINE HCL (PF) 1 % IJ SOLN: 5.0000 mL | INTRAMUSCULAR | Status: DC | PRN

## 2017-02-16 MED ORDER — ALTEPLASE 2 MG IJ SOLR
2.0000 mg | Freq: Once | INTRAMUSCULAR | Status: DC | PRN
Start: 2017-02-16 — End: 2017-02-17
  Filled 2017-02-16: qty 2

## 2017-02-16 MED ORDER — HEPARIN SODIUM (PORCINE) 1000 UNIT/ML DIALYSIS
1000.0000 [IU] | INTRAMUSCULAR | Status: DC | PRN
  Administered 2017-02-18 – 2017-02-21 (×2): 1000 [IU] via INTRAVENOUS_CENTRAL
  Filled 2017-02-16 (×3): qty 1

## 2017-02-16 MED ORDER — LIDOCAINE-PRILOCAINE 2.5-2.5 % EX CREA: 1.0000 "application " | TOPICAL_CREAM | CUTANEOUS | Status: DC | PRN

## 2017-02-16 MED ORDER — LORAZEPAM 2 MG/ML IJ SOLN
0.5000 mg | Freq: Once | INTRAMUSCULAR | Status: AC
  Administered 2017-02-16: 0.5 mg via INTRAVENOUS
  Filled 2017-02-16: qty 1

## 2017-02-16 MED ORDER — LORAZEPAM 2 MG/ML IJ SOLN
1.0000 mg | Freq: Once | INTRAMUSCULAR | Status: DC
  Filled 2017-02-16 (×2): qty 1

## 2017-02-16 NOTE — Progress Notes (Signed)
PT Cancellation Note  Patient Details Name: [name] MRN: [id number] DOB: April 19, 1945   Cancelled Treatment:    Reason Eval/Treat Not Completed: Patient at procedure or test/unavailable Pt receiving dialysis, not available for tx.  [address] LPTA; [place]  Aldona Lento 02/16/2017, 10:40 AM

## 2017-02-16 NOTE — Progress Notes (Signed)
ANTICOAGULATION CONSULT NOTE - follow up  Pharmacy Consult for Warfarin Indication: atrial fibrillation  Allergies  Allergen Reactions  . Ace Inhibitors Cough  . Amlodipine Swelling    UNSPECIFIED EDEMA   . Codeine Rash   Patient Measurements: Height: 5\' 8"  (172.7 cm) Weight: 202 lb 2.6 oz (91.7 kg) IBW/kg (Calculated) : 63.9  Vital Signs: Temp: 98.6 F (37 C) (12/13 0830) Temp Source: Oral (12/13 0830) BP: 120/57 (12/13 1030) Pulse Rate: 84 (12/13 1030)  Labs: Recent Labs    02/14/17 0416  02/14/17 0527 02/14/17 1333 02/15/17 0420 02/16/17 0354 02/16/17 0830  HGB  --    < > 10.4*  --   --  9.7* 9.6*  HCT  --   --  35.4*  --   --  32.8* 32.8*  PLT  --    < > 123* 113*  --  98*  98* 105*  APTT  --   --   --  144*  --  72*  --   LABPROT  --    < > 62.5* 67.7* 42.9* 45.7*  --   INR  --    < > 7.39* REPEATED TO VERIFY 4.56* 4.95*  --   CREATININE 5.11*  --   --   --  3.72* 4.70*  --    < > = values in this interval not displayed.   Estimated Creatinine Clearance: 12.8 mL/min (A) (by C-G formula based on SCr of 4.7 mg/dL (H)).  Medical History: Past Medical History:  Diagnosis Date  . Acute renal failure superimposed on stage 4 chronic kidney disease ([place]) 07/04/2015  . Allergic rhinitis   . Anemia   . Anxiety   . CAD in native artery    NSTEMI 07/2015 s/p DES to RCA and posterior PDA, PCI 10/2015 with scoring balloon to 85% ISR of distal RCA), known LAD/Cx disease treated medically  . Carotid artery disease ([place])    Mild bilateral carotid disease (1-39% 07/2015)  . Chronic combined systolic and diastolic CHF (congestive heart failure) ([place])   . CKD (chronic kidney disease), stage IV ([place])   . Constipation   . Essential hypertension   . GERD (gastroesophageal reflux disease)   . History of hysterectomy   . Hyperlipidemia   . Low back pain   . Obesity   . Occlusion of right subclavian artery    Right distal subclavian artery occlusion s/p thromboembolectomy  07/2015  . Osteoarthritis   . Overactive bladder   . PVC's (premature ventricular contractions)   . Type 2 diabetes mellitus ([facility])    Medications:  Medications Prior to Admission  Medication Sig Dispense Refill Last Dose  . Insulin Glargine (LANTUS) 100 UNIT/ML Solostar Pen Inject 30 Units into the skin daily at 10 pm.     . insulin lispro (HUMALOG) 100 UNIT/ML KiwkPen Inject 5-11 Units into the skin 3 (three) times daily before meals.     Marland Kitchen albuterol (PROVENTIL HFA;VENTOLIN HFA) 108 (90 Base) MCG/ACT inhaler Inhale 1-2 puffs into the lungs every 6 (six) hours as needed for wheezing or shortness of breath. 1 Inhaler 0 unknnown at prn  . albuterol (PROVENTIL) (2.5 MG/3ML) 0.083% nebulizer solution Take 3 mLs (2.5 mg total) by nebulization every 6 (six) hours as needed for wheezing or shortness of breath. 75 mL 1 unknown at prn  . ALPRAZolam (XANAX) 0.5 MG tablet Take 0.5 mg by mouth at bedtime.   12/22/2016  . amiodarone (PACERONE) 200 MG tablet Take 1 tablet (200 mg  total) by mouth daily. 30 tablet 0   . calcitRIOL (ROCALTROL) 0.5 MCG capsule Take 1 capsule (0.5 mcg total) daily by mouth. 30 capsule 0   . cetirizine (ZYRTEC) 10 MG tablet Take 5 mg by mouth daily.  3 12/23/2016  . clopidogrel (PLAVIX) 75 MG tablet Take 1 tablet (75 mg total) by mouth daily. 30 tablet 0 12/23/2016  . cyclobenzaprine (FLEXERIL) 5 MG tablet Take 5 mg by mouth as needed (lower back pain). osteoporosis with out current pathological fracture for 14 days  0 unknown at prn  . ferrous sulfate 325 (65 FE) MG tablet Take 325 mg by mouth daily with breakfast.    12/23/2016  . fluticasone (FLONASE) 50 MCG/ACT nasal spray Place 1 spray into both nostrils daily.   12/23/2016  . isosorbide mononitrate (IMDUR) 30 MG 24 hr tablet Take 1 tablet (30 mg total) 2 (two) times daily by mouth. 60 tablet 0   . latanoprost (XALATAN) 0.005 % ophthalmic solution Place 1 drop into both eyes at bedtime.    12/22/2016  . meclizine (ANTIVERT)  25 MG tablet Take 12.5 mg by mouth 2 (two) times daily.    12/23/2016  . nitroGLYCERIN (NITROSTAT) 0.4 MG SL tablet Place 1 tablet (0.4 mg total) under the tongue every 5 (five) minutes as needed for chest pain. 25 tablet 2 unknown at prn  . pantoprazole (PROTONIX) 40 MG tablet Take 40 mg by mouth daily.    12/23/2016  . rosuvastatin (CRESTOR) 40 MG tablet Take 1 tablet (40 mg total) by mouth at bedtime. 30 tablet 0 12/22/2016   Assessment: No signs of bleeding. INR high on admission despite she being off Coumadin for last 10 days.  Pt received 2 units FFP with dialysis on 12/8 and again on 12/11.   INR was therapeutic on 12/9 after FFP.  Coumadin dose given on 12/9 and then INR > 7 again.  Defer Vitamin K orders to MD.  INR today is 4.9.  Abnormal DIC panel noted.  Continue to hold Warfarin.  Per nursing report, Hematology consult pending.   Goal of Therapy:  INR 2-3   Plan:  HOLD coumadin today, allow INR to trend down, defer Vitamin K orders to MD F/U Hematology recommendations Daily PT/INR Monitor for signs and symptoms of bleeding.   [place] Crane, Jaxx Huish A 02/16/2017,10:33 AM

## 2017-02-16 NOTE — Progress Notes (Signed)
Earlier patient c/o "panick attack". Stated that she is claustrophobic, so the small room is making her breathing difficult. Patient stated that she was not on O2 at home or in ICU,  but she is now. Patient attributes needing O2 via [place] is d/t her anxiety. Xanax was given at HS, but patient states that med did not help. Schorr, NP notified. Valentino Nose, Agricultural consultant notified. It is believed that no larger room is available. Per Schorr, NP Ativan 0.5 mg IV given. Per Schorr, NP orders this RN notified her of patient's statement of no anxiety relief after 30 minutes. Second dose of Ativan 0.5 mg IV was ordered. Patient was asleep, not awaking by a knock at the door, by time order was verified and this RN went to give Ativan. Patient remains sleeping, no distress noted. Ativan wasted. Will continue to monitor.

## 2017-02-16 NOTE — Consult Note (Signed)
[facility] Consultation Oncology  Name: [name]      MRN: [id number]    Location: A305/A305-01  Date: 02/16/2017 Time:1:12 PM   REFERRING PHYSICIAN: Dr. Roderic Palau  REASON FOR CONSULT: DIC   DIAGNOSIS: DIC  HISTORY OF PRESENT ILLNESS:  Patient is a 71 year old female who was admitted for generalized weakness on 02/10/2017.  Patient is on Coumadin for atrial fibrillation and was found to have supratherapeutic INR of greater than 10.  Her Coumadin was held and she received 2 units of FFP with dialysis.  She became therapeutic on 02/13/17 with an INR of 2.28 and was resumed on her Coumadin 5 mg however her INR jump back up to 8.5 and another 2 units of FFP has been given.  Coumadin has been on hold since then.  Patient states that she has not noted any major bleeding.  Her INR today is 4.95.  Patient had a DIC panel checked as well today and demonstrated a mildly elevated d-dimer of 0.68, fibrinogen 650, PT 45.7, INR 4.95, PTT 72.  Her previous DIC panel from 02/14/2017 demonstrated d-dimer 0.79, fibrinogen 626, PT 67.7, INR 7.39, PTT 144.  Patient also had liver enzymes checked on 02/14/2017 and they were elevated with an AST 161, ALT 81, repeat on 02/15/2017 demonstrated AST 145, ALT 78.  CBC performed today demonstrated WBC 5.4K, hemoglobin 9.6 g/dL, hematocrit 30.8%, platelet count 105K.  No schistocytes were noted on peripheral smear.  Today patient states that she has been feeling better since she has been in the hospital.  She denies any chest pain, shortness of breath, abdominal pain, nausea, vomiting, diarrhea.  She received her dialysis today.  She states that she has noted that her urine has been malodorous, and just recently had a UTI in October 2018.  She denies any fevers or chills.  She states she has been eating better since she has been in the hospital.  Patient states that she has been very depressed since losing her husband recently to cancer and has not been any eating  much.  PAST MEDICAL HISTORY:   Past Medical History:  Diagnosis Date  . Acute renal failure superimposed on stage 4 chronic kidney disease ([place]) 07/04/2015  . Allergic rhinitis   . Anemia   . Anxiety   . CAD in native artery    NSTEMI 07/2015 s/p DES to RCA and posterior PDA, PCI 10/2015 with scoring balloon to 85% ISR of distal RCA), known LAD/Cx disease treated medically  . Carotid artery disease ([place])    Mild bilateral carotid disease (1-39% 07/2015)  . Chronic combined systolic and diastolic CHF (congestive heart failure) ([place])   . CKD (chronic kidney disease), stage IV ([place])   . Constipation   . Essential hypertension   . GERD (gastroesophageal reflux disease)   . History of hysterectomy   . Hyperlipidemia   . Low back pain   . Obesity   . 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 Osteoarthritis   . Overactive bladder   . PVC's (premature ventricular contractions)   . Type 2 diabetes mellitus (H[facility])     ALLERGIES: Allergies  Allergen Reactions  . Ace Inhibitors Cough  . Amlodipine Swelling    UNSPECIFIED EDEMA   . Codeine Rash      MEDICATIONS: I have reviewed the patient's current medications.     PAST SURGICAL HISTORY Past Surgical History:  Procedure Laterality Date  . ABDOMINAL  HYSTERECTOMY    . AV FISTULA PLACEMENT Left 12/30/2016   Procedure: insertion of left upper arm gortex GRAFT;  Surgeon: Angelia Mould, MD;  Location: [facility] OR;  Service: Vascular;  Laterality: Left;  . BACK SURGERY     multiple  . CARDIAC CATHETERIZATION  04/04/2006   Est EF of 60%  . CARDIAC CATHETERIZATION N/A 07/04/2015   Procedure: Left Heart Cath and Coronary Angiography;  Surgeon: Troy Sine, MD;  Location: [place] CV LAB;  Service: Cardiovascular;  Laterality: N/A;  . CARDIAC CATHETERIZATION N/A 07/04/2015   Procedure: Coronary Stent Intervention;  Surgeon: Troy Sine, MD;  Location: [place] [facility] LAB;  Service: Cardiovascular;  Laterality: N/A;  . CARDIAC CATHETERIZATION N/A 10/13/2015   Procedure: Left Heart Cath and Coronary Angiography;  Surgeon: Troy Sine, MD;  Location: [place] [facility] LAB;  Service: Cardiovascular;  Laterality: N/A;  . CARDIAC CATHETERIZATION N/A 12/02/2015   Procedure: Left Heart Cath and Coronary Angiography;  Surgeon: Peter M Martinique, MD;  Location: [place] [facility];  Service: Cardiovascular;  Laterality: N/A;  . CARPAL TUNNEL RELEASE    . CERVICAL BIOPSY     cervical lymph node biopsies  . COLONOSCOPY  May 2002   Dr. Irving Shows :Followup in 5 years, normal exam  . COLONOSCOPY  2008   Dr. Laural Golden: Very redundant colon with mild melanosis coli, splenic flexure polyp biopsy with acute complaint of benign colon polyp. Recommended ten-year followup  . CORONARY STENT PLACEMENT  04/11/2006   2 -- Taxus stents to the circumflex   . IR FLUORO GUIDE CV LINE RIGHT  01/07/2017  . IR FLUORO GUIDE CV LINE RIGHT  01/16/2017  . IR US GUIDE VASC ACCESS RIGHT  01/07/2017  . IR US GUIDE VASC ACCESS RIGHT  01/16/2017  . PERIPHERAL VASCULAR CATHETERIZATION Right 07/09/2015   Procedure: Upper Extremity Angiography;  Surgeon: Conrad Washingtonville, MD;  Location: [place] [facility] LAB;  Service: Cardiovascular;  Laterality: Right;  . PERIPHERAL VASCULAR CATHETERIZATION Right 07/10/2015   Procedure: RIGHT SUBCLAVIAN ARTERY THROMBECTOMY;  Surgeon: Serafina Mitchell, MD;  Location: [facility] OR;  Service: Vascular;  Laterality: Right;  . Tendonitis     bilateral elbow  . TRIGGER FINGER RELEASE      FAMILY HISTORY: Family History  Problem Relation Age of Onset  . Diabetes Father   . Hypertension Father   . Stroke Father   . Hypertension Brother   . Aneurysm Brother   . Diabetes Brother   . Colon cancer Neg Hx     SOCIAL HISTORY:  reports that  has never smoked. she has never used smokeless tobacco. She reports that she does not drink alcohol or use drugs.  PERFORMANCE  STATUS:  PHYSICAL EXAM: Most Recent Vital Signs: Blood pressure (!) 121/58, pulse 80, temperature 98.6 F (37 C), temperature source Oral, resp. rate 20, height '5\' 8"'$  (1.727 m), weight 202 lb 2.6 oz (91.7 kg), SpO2 98 %. Constitutional: Well-developed, well-nourished, and in no distress.   HENT:  Head: Normocephalic and atraumatic.  Mouth/Throat: No oropharyngeal exudate. Mucosa moist. Eyes: Pupils are equal, round, and reactive to light. Conjunctivae are normal. No scleral icterus.  Neck: Normal range of motion. Neck supple. No JVD present.  Cardiovascular: Normal rate, regular rhythm and normal heart sounds.  Exam reveals no gallop and no friction rub.   No murmur heard. Pulmonary/Chest: Effort normal and breath sounds normal. No respiratory distress. No wheezes.No rales.  Abdominal: Soft. Bowel sounds are  normal. No distension. There is no tenderness. There is no guarding.  Musculoskeletal: No edema or tenderness.  Lymphadenopathy:    No cervical or supraclavicular adenopathy.  Neurological: Alert and oriented to person, place, and time. No cranial nerve deficit.  Skin: Sacral decubitus ulcer on left leg. Psychiatric: Appears depressed. Judgment normal.    LABORATORY DATA:  Results for orders placed or performed during the hospital encounter of 02/10/17 (from the past 48 hour(s))  Glucose, capillary     Status: None   Collection Time: 02/14/17  1:15 PM  Result Value Ref Range   Glucose-Capillary 82 65 - 99 mg/dL  DIC (disseminated intravasc coag) panel     Status: Abnormal   Collection Time: 02/14/17  1:33 PM  Result Value Ref Range   Prothrombin Time 67.7 (H) 11.4 - 15.2 seconds   INR REPEATED TO VERIFY     Comment: CRITICAL RESULT CALLED TO, READ BACK BY AND VERIFIED WITH: LCOAXUM AT 1435 BY HFLYNT 02/14/17    aPTT 144 (H) 24 - 36 seconds    Comment:        IF BASELINE aPTT IS ELEVATED, SUGGEST PATIENT RISK ASSESSMENT BE USED TO DETERMINE APPROPRIATE ANTICOAGULANT  THERAPY.    Fibrinogen 626 (H) 210 - 475 mg/dL   D-Dimer, Quant 0.79 (H) 0.00 - 0.50 ug/mL-FEU    Comment: (NOTE) At the manufacturer cut-off of 0.50 ug/mL FEU, this assay has been documented to exclude PE with a sensitivity and negative predictive value of 97 to 99%.  At this time, this assay has not been approved by the FDA to exclude DVT/VTE. Results should be correlated with clinical presentation.    Platelets 113 (L) 150 - 400 K/uL    Comment: PLATELET COUNT CONFIRMED BY SMEAR   Smear Review Schistocytes present   Prepare fresh frozen plasma     Status: None   Collection Time: 02/14/17  2:42 PM  Result Value Ref Range   Unit Number [id number]    Blood Component Type THAWED PLASMA    Unit division 00    Status of Unit ISSUED,FINAL    Transfusion Status OK TO TRANSFUSE    Unit Number [id number]    Blood Component Type THAWED PLASMA    Unit division 00    Status of Unit ISSUED,FINAL    Transfusion Status OK TO TRANSFUSE   Glucose, capillary     Status: Abnormal   Collection Time: 02/14/17  5:07 PM  Result Value Ref Range   Glucose-Capillary 63 (L) 65 - 99 mg/dL   Comment 1 Notify RN    Comment 2 Document in Chart    Comment 3 Repeat Test   Glucose, capillary     Status: None   Collection Time: 02/14/17  5:37 PM  Result Value Ref Range   Glucose-Capillary 91 65 - 99 mg/dL   Comment 1 Notify RN    Comment 2 Document in Chart   Glucose, capillary     Status: Abnormal   Collection Time: 02/14/17  9:20 PM  Result Value Ref Range   Glucose-Capillary 200 (H) 65 - 99 mg/dL   Comment 1 Notify RN    Comment 2 Document in Chart   Protime-INR     Status: Abnormal   Collection Time: 02/15/17  4:20 AM  Result Value Ref Range   Prothrombin Time 42.9 (H) 11.4 - 15.2 seconds   INR 4.56 (HH)     Comment: RESULT REPEATED AND VERIFIED CRITICAL RESULT CALLED TO, READ BACK BY AND VERIFIED  WITH: LIGHTNER,N AT 0645 BY HUFFINES,S ON 02/15/17.   Comprehensive metabolic panel      Status: Abnormal   Collection Time: 02/15/17  4:20 AM  Result Value Ref Range   Sodium 132 (L) 135 - 145 mmol/L   Potassium 4.8 3.5 - 5.1 mmol/L   Chloride 94 (L) 101 - 111 mmol/L   CO2 24 22 - 32 mmol/L   Glucose, Bld 202 (H) 65 - 99 mg/dL   BUN 40 (H) 6 - 20 mg/dL   Creatinine, Ser 3.72 (H) 0.44 - 1.00 mg/dL   Calcium 8.6 (L) 8.9 - 10.3 mg/dL   Total Protein 7.0 6.5 - 8.1 g/dL   Albumin 2.6 (L) 3.5 - 5.0 g/dL   AST 145 (H) 15 - 41 U/L   ALT 78 (H) 14 - 54 U/L   Alkaline Phosphatase 219 (H) 38 - 126 U/L   Total Bilirubin 0.8 0.3 - 1.2 mg/dL   GFR calc non Af Amer 11 (L) >60 mL/min   GFR calc Af Amer 13 (L) >60 mL/min    Comment: (NOTE) The eGFR has been calculated using the CKD EPI equation. This calculation has not been validated in all clinical situations. eGFR's persistently <60 mL/min signify possible Chronic Kidney Disease.    Anion gap 14 5 - 15  Lactate dehydrogenase     Status: Abnormal   Collection Time: 02/15/17  4:20 AM  Result Value Ref Range   LDH 558 (H) 98 - 192 U/L  Glucose, capillary     Status: Abnormal   Collection Time: 02/15/17  7:30 AM  Result Value Ref Range   Glucose-Capillary 134 (H) 65 - 99 mg/dL  Glucose, capillary     Status: Abnormal   Collection Time: 02/15/17 11:18 AM  Result Value Ref Range   Glucose-Capillary 119 (H) 65 - 99 mg/dL  Glucose, capillary     Status: None   Collection Time: 02/15/17  4:26 PM  Result Value Ref Range   Glucose-Capillary 79 65 - 99 mg/dL  Glucose, capillary     Status: Abnormal   Collection Time: 02/15/17  8:30 PM  Result Value Ref Range   Glucose-Capillary 221 (H) 65 - 99 mg/dL   Comment 1 Notify RN    Comment 2 Document in Chart   CBC with Differential/Platelet     Status: Abnormal   Collection Time: 02/16/17  3:54 AM  Result Value Ref Range   WBC 5.3 4.0 - 10.5 K/uL   RBC 3.45 (L) 3.87 - 5.11 MIL/uL   Hemoglobin 9.7 (L) 12.0 - 15.0 g/dL   HCT 32.8 (L) 36.0 - 46.0 %   MCV 95.1 78.0 - 100.0 fL   MCH  28.1 26.0 - 34.0 pg   MCHC 29.6 (L) 30.0 - 36.0 g/dL   RDW 21.2 (H) 11.5 - 15.5 %   Platelets 98 (L) 150 - 400 K/uL    Comment: SPECIMEN CHECKED FOR CLOTS CONSISTENT WITH PREVIOUS RESULT PLATELET COUNT CONFIRMED BY SMEAR GIANT PLATELETS SEEN    Neutrophils Relative % 78 %   Neutro Abs 4.1 1.7 - 7.7 K/uL   Lymphocytes Relative 17 %   Lymphs Abs 0.9 0.7 - 4.0 K/uL   Monocytes Relative 5 %   Monocytes Absolute 0.3 0.1 - 1.0 K/uL   Eosinophils Relative 0 %   Eosinophils Absolute 0.0 0.0 - 0.7 K/uL   Basophils Relative 0 %   Basophils Absolute 0.0 0.0 - 0.1 K/uL  Basic metabolic panel     Status:  Abnormal   Collection Time: 02/16/17  3:54 AM  Result Value Ref Range   Sodium 132 (L) 135 - 145 mmol/L   Potassium 6.0 (H) 3.5 - 5.1 mmol/L    Comment: DELTA CHECK NOTED   Chloride 94 (L) 101 - 111 mmol/L   CO2 24 22 - 32 mmol/L   Glucose, Bld 139 (H) 65 - 99 mg/dL   BUN 55 (H) 6 - 20 mg/dL   Creatinine, Ser 4.70 (H) 0.44 - 1.00 mg/dL   Calcium 8.8 (L) 8.9 - 10.3 mg/dL   GFR calc non Af Amer 8 (L) >60 mL/min   GFR calc Af Amer 10 (L) >60 mL/min    Comment: (NOTE) The eGFR has been calculated using the CKD EPI equation. This calculation has not been validated in all clinical situations. eGFR's persistently <60 mL/min signify possible Chronic Kidney Disease.    Anion gap 14 5 - 15  DIC (disseminated intravasc coag) panel     Status: Abnormal   Collection Time: 02/16/17  3:54 AM  Result Value Ref Range   Prothrombin Time 45.7 (H) 11.4 - 15.2 seconds   INR 4.95 (HH)     Comment: RESULT REPEATED AND VERIFIED CRITICAL RESULT CALLED TO, READ BACK BY AND VERIFIED WITH: LIGHTNER,N AT 0730 BY HUFFINES,S ON 02/16/17.    aPTT 72 (H) 24 - 36 seconds    Comment:        IF BASELINE aPTT IS ELEVATED, SUGGEST PATIENT RISK ASSESSMENT BE USED TO DETERMINE APPROPRIATE ANTICOAGULANT THERAPY.    Fibrinogen 650 (H) 210 - 475 mg/dL   D-Dimer, Quant 0.68 (H) 0.00 - 0.50 ug/mL-FEU    Comment:  (NOTE) At the manufacturer cut-off of 0.50 ug/mL FEU, this assay has been documented to exclude PE with a sensitivity and negative predictive value of 97 to 99%.  At this time, this assay has not been approved by the FDA to exclude DVT/VTE. Results should be correlated with clinical presentation.    Platelets 98 (L) 150 - 400 K/uL   Smear Review NO SCHISTOCYTES SEEN   Glucose, capillary     Status: None   Collection Time: 02/16/17  7:40 AM  Result Value Ref Range   Glucose-Capillary 90 65 - 99 mg/dL  CBC     Status: Abnormal   Collection Time: 02/16/17  8:30 AM  Result Value Ref Range   WBC 5.4 4.0 - 10.5 K/uL   RBC 3.45 (L) 3.87 - 5.11 MIL/uL   Hemoglobin 9.6 (L) 12.0 - 15.0 g/dL   HCT 32.8 (L) 36.0 - 46.0 %   MCV 95.1 78.0 - 100.0 fL   MCH 27.8 26.0 - 34.0 pg   MCHC 29.3 (L) 30.0 - 36.0 g/dL   RDW 21.5 (H) 11.5 - 15.5 %   Platelets 105 (L) 150 - 400 K/uL    Comment: SPECIMEN CHECKED FOR CLOTS PLATELET COUNT CONFIRMED BY SMEAR   Lactate dehydrogenase     Status: Abnormal   Collection Time: 02/16/17  8:30 AM  Result Value Ref Range   LDH 583 (H) 98 - 192 U/L  Albumin     Status: Abnormal   Collection Time: 02/16/17  8:30 AM  Result Value Ref Range   Albumin 2.6 (L) 3.5 - 5.0 g/dL  Phosphorus     Status: Abnormal   Collection Time: 02/16/17  8:30 AM  Result Value Ref Range   Phosphorus 6.1 (H) 2.5 - 4.6 mg/dL  Glucose, capillary     Status: None  Collection Time: 02/16/17 11:21 AM  Result Value Ref Range   Glucose-Capillary 98 65 - 99 mg/dL      RADIOGRAPHY: US Abdomen Complete  Result Date: 02/15/2017 CLINICAL DATA:  Transaminitis EXAM: ABDOMEN ULTRASOUND COMPLETE COMPARISON:  CT abdomen and pelvis March 20, 2016 FINDINGS: Gallbladder: Within the gallbladder, there are multiple echogenic foci which move and shadow consistent with cholelithiasis. Largest gallstone measures 7 mm in length. In addition, there is a 3 mm echogenic focus which neither moves nor shadows,  a presumed small polyp. Gallbladder wall is upper normal in thickness without edema or pericholecystic fluid. No sonographic Murphy sign noted by sonographer. Common bile duct: Diameter: 4 mm. No intrahepatic, common hepatic, or common bile duct dilatation. Liver: No focal lesion identified. Within normal limits in parenchymal echogenicity. Portal vein is patent on color Doppler imaging with normal direction of blood flow towards the liver. IVC: No abnormality visualized. Pancreas: Visualized portion unremarkable. Portions of the pancreas are obscured by gas. Spleen: Size and appearance within normal limits. Right Kidney: Length: 10.9 cm. Echogenicity within normal limits. No hydronephrosis visualized. There is a cyst in the mid right kidney measuring 1.2 x 0.8 x 0.9 cm Left Kidney: Length: 11.1 cm. Echogenicity within normal limits. No mass or hydronephrosis visualized. Abdominal aorta: No aneurysm visualized. Other findings: No demonstrable ascites. There are pleural effusions bilaterally. IMPRESSION: 1. Cholelithiasis. There is also a 3 mm polyp within the gallbladder. 2. Portions of pancreas obscured by gas. Visualized portions of pancreas appear normal. 3.  Small cyst in mid right kidney. 4.  Pleural effusions bilaterally. Electronically Signed   By: Lowella Grip III M.D.   On: 02/15/2017 15:43        ASSESSMENT: 71 year old female with A. 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 Found to have supratherapeutic INR >10. Consulted for possible DIC.  PLAN:  -Patient's lab work is not consistent with DIC. Her fibrinogen should be very low in DIC but is elevated instead. I believe that her coagulation abnormalities may be more related to liver disease. Patient does have a transaminitis of unclear etiology. Her abdominal US did not show any evidence of liver disease. May consider a GI workup if transaminitis does not trend down, or her transaminitis may be  medication induced. One way to check to see if her coagulopathy is secondary to liver disease vs. DIC is to check a factor VIII activity level. Factor VIII levels generally are increased or normal in liver disease because factor VIII is produced in endothelial cells rather than the liver. In DIC, factor VIII levels generally are low. D-dimer levels generally are normal or mildly elevated in liver disease. D-dimer may be dramatically increased in DIC because DIC results in more profound fibrinolysis. LDH may be elevated due to liver disease as well. Her mild thrombocytopenia may be due to her liver disease as well and decreased production of thrombopoietin by the liver, however her platelets are improving as her transaminitis is improving.  -Right now I would recommend to trend her PT/INR/PTT (which are all trending down) and her d-dimer (which is trending down as well).  -Patient is not having any stigmata of bleeding. If there is continued concern about her INR being elevated and she has bleeding risk, can consider giving her vitamin K '10mg'$  PO daily for 3 days to bring down her INR. I would leave her alone and just let the INR trend down on its own if she  since she is not bleeding.  -Multiple studies have shown that for patients with A. Fib who are on hemodialysis, warfarin was not associated with reduced outcomes of stroke but was rather associated with increased bleeding events, as high as 40%. In addition, with the potential drug drug interaction with amiodarone and her poor nutritional status, this puts the patient at increased risk for bleeding from coumadin. If ok with cardiology, I would recommend switching her to eliquis for stroke prevention for her A.Fib.   -Recommend to check UA, since patient states that it has been malodorous to rule out UTI.   All questions were answered. The patient knows to call the clinic with any problems, questions or concerns. We can certainly see the patient much  sooner if necessary.  Twana First, MD

## 2017-02-16 NOTE — Progress Notes (Signed)
Subjective: Interval History: Patient is seen on hemodialysis.  Patient complains of anxiety.  She states that she seems to be seeing her husband.  Patient is asking to be removed from the bathroom.  She denies any difficulty breathing.  States she is feeling weak but getting better.  Objective: Vital signs in last 24 hours: Temp:  [98 F (36.7 C)-98.6 F (37 C)] 98.6 F (37 C) (12/13 0830) Pulse Rate:  [65-85] 85 (12/13 0900) Resp:  [18-20] 20 (12/13 0830) BP: (113-132)/(54-65) 122/61 (12/13 0900) SpO2:  [98 %-100 %] 98 % (12/13 0830) Weight:  [91.7 kg (202 lb 2.6 oz)-91.7 kg (202 lb 3.2 oz)] 91.7 kg (202 lb 2.6 oz) (12/13 0830) Weight change: 0.017 kg (0.6 oz)  Intake/Output from previous day: 12/12 0701 - 12/13 0700 In: 603 [P.O.:600; I.V.:3] Out: -  Intake/Output this shift: No intake/output data recorded.  General appearance: alert, cooperative and no distress Resp: clear to auscultation bilaterally Cardio: regular rate and rhythm Extremities: No edema  Lab Results: Recent Labs    02/16/17 0354 02/16/17 0830  WBC 5.3 5.4  HGB 9.7* 9.6*  HCT 32.8* 32.8*  PLT 98*  98* PENDING   BMET:  Recent Labs    02/15/17 0420 02/16/17 0354  NA 132* 132*  K 4.8 6.0*  CL 94* 94*  CO2 24 24  GLUCOSE 202* 139*  BUN 40* 55*  CREATININE 3.72* 4.70*  CALCIUM 8.6* 8.8*   No results for input(s): PTH in the last 72 hours. Iron Studies: No results for input(s): IRON, TIBC, TRANSFERRIN, FERRITIN in the last 72 hours.  Studies/Results: US Abdomen Complete  Result Date: 02/15/2017 CLINICAL DATA:  Transaminitis EXAM: ABDOMEN ULTRASOUND COMPLETE COMPARISON:  CT abdomen and pelvis March 20, 2016 FINDINGS: Gallbladder: Within the gallbladder, there are multiple echogenic foci which move and shadow consistent with cholelithiasis. Largest gallstone measures 7 mm in length. In addition, there is a 3 mm echogenic focus which neither moves nor shadows, a presumed small polyp.  Gallbladder wall is upper normal in thickness without edema or pericholecystic fluid. No sonographic Murphy sign noted by sonographer. Common bile duct: Diameter: 4 mm. No intrahepatic, common hepatic, or common bile duct dilatation. Liver: No focal lesion identified. Within normal limits in parenchymal echogenicity. Portal vein is patent on color Doppler imaging with normal direction of blood flow towards the liver. IVC: No abnormality visualized. Pancreas: Visualized portion unremarkable. Portions of the pancreas are obscured by gas. Spleen: Size and appearance within normal limits. Right Kidney: Length: 10.9 cm. Echogenicity within normal limits. No hydronephrosis visualized. There is a cyst in the mid right kidney measuring 1.2 x 0.8 x 0.9 cm Left Kidney: Length: 11.1 cm. Echogenicity within normal limits. No mass or hydronephrosis visualized. Abdominal aorta: No aneurysm visualized. Other findings: No demonstrable ascites. There are pleural effusions bilaterally. IMPRESSION: 1. Cholelithiasis. There is also a 3 mm polyp within the gallbladder. 2. Portions of pancreas obscured by gas. Visualized portions of pancreas appear normal. 3.  Small cyst in mid right kidney. 4.  Pleural effusions bilaterally. Electronically Signed   By: Lowella Grip III M.D.   On: 02/15/2017 15:43    I have reviewed the patient's current medications.  Assessment/Plan: 1] end-stage renal disease: Presently patient is on dialysis.  At this moment she denies any nausea or vomiting.  Her appetite is improving. 2] anemia: Her hemoglobin is below our target goal. 3] bone and mineral disorder: Her calcium and phosphorus is range 4] hypertension: Her blood pressure is  reasonably controlled 5] fluid management: No sign of fluid overload. 6] feeling tired and weak.  Patient is improving. 7] history of anxiety disorder: Patient has lost her husband recently and at this moment she seems to have situational depression.  according to  her she lives with her daughter however she means her husband very much. Plan: 1] as stated above she is on dialysis presently seems to be stable. 2] if her blood pressure tolerates we will remove about 2 L. 3] we will check her CBC renal panel in the morning.    LOS: 6 days   Vernadine Coombs S 02/16/2017,9:30 AM

## 2017-02-16 NOTE — Care Management Note (Signed)
Case Management Note  Patient Details  Name: [name] MRN: [id number] Date of Birth: [birth year]     If discussed at Long Length of Stay Meetings, dates discussed:  02/16/2017  Additional Comments:  Micha Dosanjh, Chauncey Reading, RN 02/16/2017, 12:05 PM

## 2017-02-16 NOTE — Progress Notes (Signed)
The milk and molasses enema ordered is to be postponed at the patient's request. -patient resting in NAD at time enema was offered and therefore was not interested in having the enema completed at this time.

## 2017-02-16 NOTE — Progress Notes (Signed)
CRITICAL VALUE ALERT  Critical Value:  4.59  Date & Time Notied:  02/16/17 0800  Provider Notified: Memon   Orders Received/Actions taken:

## 2017-02-16 NOTE — Progress Notes (Signed)
PROGRESS NOTE                                                                                                                                                                                                             Patient Demographics:    [name], is a 71 y.o. female, DOB - 1946-01-13, VEL:[id number]  Admit date - 02/10/2017   Admitting Physician Louellen Molder, MD  Outpatient Primary MD for the patient is [name], MD  LOS - 6  Outpatient Specialists:Nephrology  Chief Complaint  Patient presents with  . Weakness       Brief Narrative   70 year old female with ischemic Cardiomyopathy, coronary artery disease with history of STEMI in 2017 stent placement, ESRD on dialysis (recently started, tu, th, sat  Schedule), Hypertension, hyperlipidemia, type 2 diabetes mellitus, A. Fib on anticoagulation presented with generalized weakness for past few weeks. She was hospitalized 2 weeks back for hypoglycemia secondary to poor by mouth intake and her insulin was stopped. Her INR also has been supratherapeutic for last several days and Her Coumadin was stopped about 10 days back When INR was checked during dialysis. Patient recently lost her husband and has been feeling low with poor appetite and generalized weakness. Her last dialysis session was also incomplete as patient reached late for her session.Despite stopping the Coumadin her INR Did not return to normal. She had her INR checked on 12/7 and was found to be >10. Patient was sent to the ED. Vitals in the ED was stable but had elevated troponin 1.68.Cardiology was consulted who recommended this was On likely due to ACS.Patient admitted for further management. She reports feeling weak and lightheaded. Denies any fever, chills, headache, blurred vision, nausea, vomiting, chest pain, palpitations, orthopnea, PND, abdominal pain, dysuria (Makes little urine) or diarrhea.  Denies any muscle or joint aches. Patient admitted for further management.   Subjective:   received magnesium citrate yesterday without significant bowel movement.  Overall no other new complaints.  Seen on dialysis today.  Assessment  & Plan :    Principal Problem:   Supratherapeutic INR No signs of bleeding. INR was greater than 10 despite patient being off Coumadin for almost 10 days. Received 2 units FFP with dialysis and finally became therapeutic 3 days into hospitalization. She was on amiodarone and also had poor by mouth  intake both can Keep INR elevated. No other drug interaction. Also had mild transaminitis on admission labs.  Patient received 5 mg Coumadin (after INR therapeutic) INR again jumped to 8.5. Ordered another 2 unit FFP.  Holding Coumadin for now. -Her CHADS2vasc score is 6 With high risk of stroke without anticoagulation. -Workup for coagulopathy as noted below.  Coagulopathy Patient's DIC panel does show elevated PTT, D-dimer and low platelets. Also has transaminitis.  Transient schistocytes noted on smears.  LDH also elevated.  Patient seen by hematology, appreciate input.  Felt that her lab abnormalities and coagulopathy may be related to liver disease as opposed to DIC.  It was recommended to continue observation of the patient's labs and consider giving vitamin K for elevated INR.  Coumadin was not felt to be a good choice in this patient and recommendations were to transition patient to Xarelto or Eliquis.  Will discuss with cardiology.  Elevated troponin Possibly associated with ESRD. No EKG changes or chest pain symptoms.Troponin peaked at 1.71. Cardiology consult appreciated. 2-D echo from 3 months back with EF of 35 and 35% and grade 2 diastolic dysfunction. Also showed mid inferior and basal mid inferior lateral hypokinesis. Continue current home medications.  ESRD on hemodialysis Nephrology following for dialysis needs.  Atrial fibrillation Rate  controlled. Coumadin held for Supratherapeutic INR again.  Amiodarone currently on hold due to elevated LFTs  Coronary artery disease Continue Plavix.  Statin on hold due to elevated LFTs  Hyperglycemia With type 2 diabetes mellitus Blood glucose 400 in the ED. CBG now improved. Nursing reports that patient has been refusing her Lantus for last 2 days.  Ischemic Cardiomyopathy No signs of volume overload. Management with dialysis.    Sacral decubitus ulcer stage II, Leg ulcer Care per nursing, frequent repositioning in bed. Has left shin split , no bleeding or infection, clean eschar over left heel, small deep tissue injury over rt heel. Wound care consulted.  Right elbow and left knee pain. Suspected acute gouty arthritis +/- right elbow bursitis. Continue with oral prednisone and apply Lidoderm patch over right elbow.X-ray unremarkable. Improving with steroids    Failure to thrive in adult Has poor by mouth intake. Nutrition consult appreciated and added supplement. PT recommend SNF.  Depression After losing her husband recently. Offered antidepressants but patient refused.  Elevated liver enzymes. Etiology is unclear.  Ultrasound imaging did not show any significant abnormalities in liver.  Holding amiodarone and statin.  Bilirubin is noted to be normal.  Continue to monitor     Code Status :Full code  Family Communication  :None at bedside  Disposition Plan  : discharge to SNF when stable  Barriers For Discharge : Active symptoms  Consults  :  Nephrology, cardiology, hematology  Procedures  : None  DVT Prophylaxis  :  Supratherapeutic INR  Lab Results  Component Value Date   PLT 105 (L) 02/16/2017    Antibiotics  :    Anti-infectives (From admission, onward)   None        Objective:   Vitals:   02/16/17 1230 02/16/17 1245 02/16/17 1447 02/16/17 1513  BP: (!) 122/57 125/60  (!) 106/47  Pulse: 80 81  71  Resp:  20  16  Temp:  98.6 F (37 C)  (!) 97.5  F (36.4 C)  TempSrc:  Oral  Oral  SpO2:   97% 97%  Weight:      Height:        Wt Readings from Last 3  Encounters:  02/16/17 91.7 kg (202 lb 2.6 oz)  02/02/17 91.9 kg (202 lb 9.6 oz)  01/17/17 83.3 kg (183 lb 10.3 oz)     Intake/Output Summary (Last 24 hours) at 02/16/2017 1942 Last data filed at 02/16/2017 1754 Gross per 24 hour  Intake 603 ml  Output 2000 ml  Net -1397 ml     Physical Exam Gen.: Elderly female not in distress, appears fatigued HEENT: Pallor present,Moist mucosa, supple neck Chest: Clear to auscultation bilaterally CVS: Normal S1 and S2, no murmurs GI: Soft, nondistended, nontender Muscular scheduled to: Warm, no edema, left knee tenderness improved, as tenderness on the lateral aspect of right elbow, stage II sacral decubitus ulcer, left. Ulcer with eschar tissue and deep tissue injury over right heel.      Data Review:    CBC Recent Labs  Lab 02/10/17 1243 02/11/17 0459 02/11/17 2055 02/12/17 0414 02/14/17 0527 02/14/17 1333 02/16/17 0354 02/16/17 0830  WBC 7.2 6.1 6.9 6.5 6.4  --  5.3 5.4  HGB 10.5* 9.9* 10.0* 10.0* 10.4*  --  9.7* 9.6*  HCT 36.1 33.1* 33.5* 33.9* 35.4*  --  32.8* 32.8*  PLT 174 163 121* 145* 123* 113* 98*  98* 105*  MCV 96.0 95.4 95.2 96.3 94.9  --  95.1 95.1  MCH 27.9 28.5 28.4 28.4 27.9  --  28.1 27.8  MCHC 29.1* 29.9* 29.9* 29.5* 29.4*  --  29.6* 29.3*  RDW 20.8* 20.8* 20.8* 21.3* 21.1*  --  21.2* 21.5*  LYMPHSABS 0.5* 0.7  --   --   --   --  0.9  --   MONOABS 0.3 0.4  --   --   --   --  0.3  --   EOSABS 0.0 0.0  --   --   --   --  0.0  --   BASOSABS 0.0 0.0  --   --   --   --  0.0  --     Chemistries  Recent Labs  Lab 02/10/17 1243  02/11/17 1500 02/12/17 0414 02/14/17 0416 02/15/17 0420 02/16/17 0354  NA 131*   < > 133* 134* 127* 132* 132*  K 4.6   < > 3.8 4.7 5.5* 4.8 6.0*  CL 91*   < > 95* 95* 90* 94* 94*  CO2 28   < > 26 28 23 24 24   GLUCOSE 401*   < > 273* 121* 105* 202* 139*  BUN 33*   < >  43* 30* 63* 40* 55*  CREATININE 4.06*   < > 4.83* 3.89* 5.11* 3.72* 4.70*  CALCIUM 8.9   < > 8.7* 8.4* 8.2* 8.6* 8.8*  AST 112*  --   --   --  161* 145*  --   ALT 60*  --   --   --  81* 78*  --   ALKPHOS 227*  --   --   --  239* 219*  --   BILITOT 0.7  --   --   --  0.8 0.8  --    < > = values in this interval not displayed.   ------------------------------------------------------------------------------------------------------------------ No results for input(s): CHOL, HDL, LDLCALC, TRIG, CHOLHDL, LDLDIRECT in the last 72 hours.  Lab Results  Component Value Date   HGBA1C 8.8 (H) 10/31/2016   ------------------------------------------------------------------------------------------------------------------ No results for input(s): TSH, T4TOTAL, T3FREE, THYROIDAB in the last 72 hours.  Invalid input(s): FREET3 ------------------------------------------------------------------------------------------------------------------ Recent Labs    02/16/17 0354  VITAMINB12 2,812*  Coagulation profile Recent Labs  Lab 02/13/17 0529 02/14/17 0527 02/14/17 1333 02/15/17 0420 02/16/17 0354  INR 2.28 7.39* REPEATED TO VERIFY 4.56* 4.95*    Recent Labs    02/14/17 1333 02/16/17 0354  DDIMER 0.79* 0.68*    Cardiac Enzymes Recent Labs  Lab 02/11/17 2055 02/12/17 0414 02/12/17 1023  TROPONINI 1.09* 1.09* 1.22*   ------------------------------------------------------------------------------------------------------------------    Component Value Date/Time   BNP 2,761.4 (H) 12/23/2016 1855    Inpatient Medications  Scheduled Meds: . ALPRAZolam  0.5 mg Oral QHS  . calcitRIOL  0.5 mcg Oral Daily  . clopidogrel  75 mg Oral Daily  . epoetin (EPOGEN/PROCRIT) injection  4,000 Units Intravenous Q T,Th,Sa-HD  . feeding supplement (NEPRO CARB STEADY)  237 mL Oral BID BM  . feeding supplement (PRO-STAT SUGAR FREE 64)  30 mL Oral BID  . ferrous sulfate  325 mg Oral Q breakfast  .  fluticasone  1 spray Each Nare Daily  . insulin aspart  0-15 Units Subcutaneous TID WC  . insulin glargine  28 Units Subcutaneous Q2200  . isosorbide mononitrate  30 mg Oral BID  . latanoprost  1 drop Both Eyes QHS  . levalbuterol  0.63 mg Nebulization TID  . lidocaine  1 patch Transdermal Q24H  . loratadine  10 mg Oral Daily  . LORazepam  1 mg Intravenous Once  . meclizine  12.5 mg Oral BID  . mouth rinse  15 mL Mouth Rinse BID  . pantoprazole  40 mg Oral Daily  . polyethylene glycol  17 g Oral Daily  . predniSONE  40 mg Oral Q breakfast  . rosuvastatin  40 mg Oral QHS  . sodium chloride flush  3 mL Intravenous Q12H  . vitamin A & D   Topical QID  . Warfarin - Pharmacist Dosing Inpatient   Does not apply Q24H   Continuous Infusions: . sodium chloride    . sodium chloride    . sodium chloride    . sodium chloride    . sodium chloride    . sodium chloride    . sodium chloride     PRN Meds:.sodium chloride, sodium chloride, sodium chloride, sodium chloride, sodium chloride, sodium chloride, acetaminophen **OR** acetaminophen, albuterol, alteplase, alteplase, cyclobenzaprine, heparin, heparin, lidocaine (PF), lidocaine (PF), lidocaine-prilocaine, lidocaine-prilocaine, ondansetron **OR** ondansetron (ZOFRAN) IV, pentafluoroprop-tetrafluoroeth, pentafluoroprop-tetrafluoroeth, senna-docusate  Micro Results No results found for this or any previous visit (from the past 240 hour(s)).  Radiology Reports Dg Chest 2 View  Result Date: 02/10/2017 CLINICAL DATA:  Cough and fatigue EXAM: CHEST  2 VIEW COMPARISON:  January 29, 2017 FINDINGS: There remains airspace consolidation in the right base with small pleural effusions bilaterally. There is cardiomegaly with pulmonary vascularity within normal limits. No adenopathy. Central catheter tip is at the cavoatrial junction. No pneumothorax. No evident bone lesions. IMPRESSION: Stable cardiac prominence with bilateral pleural effusions. Suspect  superimposed pneumonia right base. Lungs elsewhere clear. Pulmonary vascularity appears within normal limits. No pneumothorax. Central catheter position unchanged. Electronically Signed   By: Lowella Grip III M.D.   On: 02/10/2017 13:58   Dg Elbow 2 Views Right  Result Date: 02/12/2017 CLINICAL DATA:  71 year old female with right elbow pain. EXAM: RIGHT ELBOW - 2 VIEW COMPARISON:  03/04/2004 FINDINGS: Please note, this is a limited evaluation due to suboptimal positioning. There is no evidence of fracture, dislocation, or joint effusion. There is no evidence of arthropathy or other focal bone abnormality. Soft tissues are unremarkable. IMPRESSION: Limited study due to  suboptimal positioning. No gross osseous or soft tissue abnormalities. Electronically Signed   By: Kristopher Oppenheim M.D.   On: 02/12/2017 09:32   Dg Knee 1-2 Views Left  Result Date: 02/12/2017 CLINICAL DATA:  71 year old female with left knee pain. EXAM: LEFT KNEE - 1-2 VIEW COMPARISON:  None. FINDINGS: No evidence of fracture, dislocation, or joint effusion. No focal bone abnormality. There are mild degenerative changes with chondrocalcinosis noted. Soft tissues are unremarkable. IMPRESSION: No acute osseous abnormality. Electronically Signed   By: Kristopher Oppenheim M.D.   On: 02/12/2017 09:31   US Abdomen Complete  Result Date: 02/15/2017 CLINICAL DATA:  Transaminitis EXAM: ABDOMEN ULTRASOUND COMPLETE COMPARISON:  CT abdomen and pelvis March 20, 2016 FINDINGS: Gallbladder: Within the gallbladder, there are multiple echogenic foci which move and shadow consistent with cholelithiasis. Largest gallstone measures 7 mm in length. In addition, there is a 3 mm echogenic focus which neither moves nor shadows, a presumed small polyp. Gallbladder wall is upper normal in thickness without edema or pericholecystic fluid. No sonographic Murphy sign noted by sonographer. Common bile duct: Diameter: 4 mm. No intrahepatic, common hepatic, or common  bile duct dilatation. Liver: No focal lesion identified. Within normal limits in parenchymal echogenicity. Portal vein is patent on color Doppler imaging with normal direction of blood flow towards the liver. IVC: No abnormality visualized. Pancreas: Visualized portion unremarkable. Portions of the pancreas are obscured by gas. Spleen: Size and appearance within normal limits. Right Kidney: Length: 10.9 cm. Echogenicity within normal limits. No hydronephrosis visualized. There is a cyst in the mid right kidney measuring 1.2 x 0.8 x 0.9 cm Left Kidney: Length: 11.1 cm. Echogenicity within normal limits. No mass or hydronephrosis visualized. Abdominal aorta: No aneurysm visualized. Other findings: No demonstrable ascites. There are pleural effusions bilaterally. IMPRESSION: 1. Cholelithiasis. There is also a 3 mm polyp within the gallbladder. 2. Portions of pancreas obscured by gas. Visualized portions of pancreas appear normal. 3.  Small cyst in mid right kidney. 4.  Pleural effusions bilaterally. Electronically Signed   By: Lowella Grip III M.D.   On: 02/15/2017 15:43   Dg Chest Port 1 View  Result Date: 01/29/2017 CLINICAL DATA:  Increasing weakness after dialysis this afternoon. EXAM: PORTABLE CHEST 1 VIEW COMPARISON:  12/26/2016 FINDINGS: Interval placement of a right central venous catheter with tip over the cavoatrial junction region. No pneumothorax. Cardiac enlargement with pulmonary vascular congestion. Bilateral pleural effusions with basilar atelectasis. No significant change since previous study. IMPRESSION: Cardiac enlargement, pulmonary vascular congestion, bilateral pleural effusions, and basilar atelectasis similar to previous study. Electronically Signed   By: Lucienne Capers M.D.   On: 01/29/2017 01:09    Time Spent in minutes  25   Kathie Dike M.D on 02/16/2017 at 7:42 PM  Between 7am to 7pm - Pager [phone]  After 7pm go to [email or web address] - password [facility]  [facility] -  Office  [phone]

## 2017-02-17 ENCOUNTER — Encounter (HOSPITAL_COMMUNITY): Payer: Self-pay

## 2017-02-17 ENCOUNTER — Ambulatory Visit (HOSPITAL_COMMUNITY)
Admission: RE | Admit: 2017-02-17 | Discharge: 2017-02-17 | Disposition: A | Discharge status: Home / Self Care | Payer: BLUE CROSS/BLUE SHIELD | Source: Ambulatory Visit | Attending: Podiatry | Admitting: Podiatry

## 2017-02-17 LAB — GLUCOSE, CAPILLARY
GLUCOSE-CAPILLARY: 63 mg/dL — AB (ref 65–99)
GLUCOSE-CAPILLARY: 165 mg/dL — AB (ref 65–99)
GLUCOSE-CAPILLARY: 159 mg/dL — AB (ref 65–99)
Glucose-Capillary: 108 mg/dL — ABNORMAL HIGH (ref 65–99)
Glucose-Capillary: 41 mg/dL — CL (ref 65–99)
Glucose-Capillary: 140 mg/dL — ABNORMAL HIGH (ref 65–99)
Glucose-Capillary: 218 mg/dL — ABNORMAL HIGH (ref 65–99)

## 2017-02-17 LAB — RENAL FUNCTION PANEL
Albumin: 2.4 g/dL — ABNORMAL LOW (ref 3.5–5.0)
Anion gap: 12 (ref 5–15)
BUN: 37 mg/dL — AB (ref 6–20)
CALCIUM: 8.7 mg/dL — AB (ref 8.9–10.3)
CHLORIDE: 95 mmol/L — AB (ref 101–111)
CO2: 27 mmol/L (ref 22–32)
CREATININE: 3.59 mg/dL — AB (ref 0.44–1.00)
GFR, EST AFRICAN AMERICAN: 14 mL/min — AB (ref >60)
GFR, EST NON AFRICAN AMERICAN: 12 mL/min — AB (ref >60)
Glucose, Bld: 74 mg/dL (ref 65–99)
Phosphorus: 4.3 mg/dL (ref 2.5–4.6)
Potassium: 4.5 mmol/L (ref 3.5–5.1)
SODIUM: 134 mmol/L — AB (ref 135–145)

## 2017-02-17 LAB — PROTIME-INR
INR: 3.7
PROTHROMBIN TIME: 36.4 s — AB (ref 11.4–15.2)

## 2017-02-17 LAB — HEPATIC FUNCTION PANEL
ALBUMIN: 2.4 g/dL — AB (ref 3.5–5.0)
ALK PHOS: 213 U/L — AB (ref 38–126)
ALT: 58 U/L — ABNORMAL HIGH (ref 14–54)
AST: 78 U/L — ABNORMAL HIGH (ref 15–41)
BILIRUBIN INDIRECT: 0.3 mg/dL (ref 0.3–0.9)
BILIRUBIN TOTAL: 0.7 mg/dL (ref 0.3–1.2)
Bilirubin, Direct: 0.4 mg/dL (ref 0.1–0.5)
TOTAL PROTEIN: 7 g/dL (ref 6.5–8.1)

## 2017-02-17 LAB — CBC
HCT: 32.1 % — ABNORMAL LOW (ref 36.0–46.0)
Hemoglobin: 9.6 g/dL — ABNORMAL LOW (ref 12.0–15.0)
MCH: 28.3 pg (ref 26.0–34.0)
MCHC: 29.9 g/dL — ABNORMAL LOW (ref 30.0–36.0)
MCV: 94.7 fL (ref 78.0–100.0)
PLATELETS: 107 10*3/uL — AB (ref 150–400)
RBC: 3.39 MIL/uL — AB (ref 3.87–5.11)
RDW: 21.4 % — AB (ref 11.5–15.5)
WBC: 5.7 10*3/uL (ref 4.0–10.5)

## 2017-02-17 MED ORDER — DEXTROSE 50 % IV SOLN
INTRAVENOUS | Status: AC
  Filled 2017-02-17: qty 50

## 2017-02-17 MED ORDER — DEXTROSE 50 % IV SOLN
1.0000 | Freq: Once | INTRAVENOUS | Status: AC
  Administered 2017-02-17: 50 mL via INTRAVENOUS

## 2017-02-17 MED ORDER — DEXTROSE-NACL 5-0.9 % IV SOLN
INTRAVENOUS | Status: DC
  Administered 2017-02-17: 10:00:00 via INTRAVENOUS

## 2017-02-17 MED ORDER — INSULIN GLARGINE 100 UNIT/ML ~~LOC~~ SOLN
10.0000 [IU] | Freq: Every day | SUBCUTANEOUS | Status: DC
  Filled 2017-02-17 (×3): qty 0.1

## 2017-02-17 MED ORDER — DEXTROSE-NACL 5-0.9 % IV SOLN: INTRAVENOUS | Status: DC

## 2017-02-17 NOTE — Progress Notes (Signed)
Inpatient Diabetes Program Recommendations  AACE/ADA: New Consensus Statement on Inpatient Glycemic Control (2015)  Target Ranges:  Prepandial:   less than 140 mg/dL      Peak postprandial:   less than 180 mg/dL (1-2 hours)      Critically ill patients:  140 - 180 mg/dL   Results for [name], [name] (MRN [id number]) as of 02/17/2017 11:18  Ref. Range 02/16/2017 07:40 02/16/2017 11:21 02/16/2017 16:31 02/16/2017 21:50 02/16/2017 23:00  Glucose-Capillary Latest Ref Range: 65 - 99 mg/dL 90 98 98 70 47 (L)   Results for [name], [name] (MRN [id number]) as of 02/17/2017 11:18  Ref. Range 02/17/2017 07:39 02/17/2017 08:14 02/17/2017 08:58  Glucose-Capillary Latest Ref Range: 65 - 99 mg/dL 41 (LL) 63 (L) 165 (H)    Home DM Meds: Lantus 30 units QHS       Humalog 5-11 units TID  Current Insulin Orders: Lantus 28 units QHS      Novolog Moderate Correction Scale/ SSI (0-15 units) TID AC      MD- Patient with Hypoglycemia last night at 11pm (CBG 47 mg/dl) and patient with Hypoglycemia again this AM (CBG 41 mg/dl).  Lantus NOT given last PM.  No Novolog administered yesterday either.  Please consider the following:  1. Reduce Lantus to 10 units QHS (1/3 total home dose)  2. Reduce Novolog SSI to Sensitive scale (0-9 units) TID AC + HS      --Will follow patient during hospitalization--  Wyn Quaker RN, MSN, CDE Diabetes Coordinator Inpatient Glycemic Control Team Team Pager: [phone] (8a-5p)

## 2017-02-17 NOTE — Progress Notes (Signed)
PROGRESS NOTE                                                                                                                                                                                                             Patient Demographics:    [name], is a 71 y.o. female, DOB - 1945-09-06, TKW:[id number]  Admit date - 02/10/2017   Admitting Physician Louellen Molder, MD  Outpatient Primary MD for the patient is [name], MD  LOS - 7  Outpatient Specialists:Nephrology  Chief Complaint  Patient presents with  . Weakness       Brief Narrative   71 year old female with ischemic Cardiomyopathy, coronary artery disease with history of STEMI in 2017 stent placement, ESRD on dialysis (recently started, tu, th, sat  Schedule), Hypertension, hyperlipidemia, type 2 diabetes mellitus, A. Fib on anticoagulation presented with generalized weakness for past few weeks. She was hospitalized 2 weeks back for hypoglycemia secondary to poor by mouth intake and her insulin was stopped. Her INR also has been supratherapeutic for last several days and Her Coumadin was stopped about 10 days back When INR was checked during dialysis. Patient recently lost her husband and has been feeling low with poor appetite and generalized weakness. Her last dialysis session was also incomplete as patient reached late for her session.Despite stopping the Coumadin her INR Did not return to normal. She had her INR checked on 12/7 and was found to be >10. Patient was sent to the ED. Vitals in the ED was stable but had elevated troponin 1.68.Cardiology was consulted who recommended this was On likely due to ACS.Patient admitted for further management. She reports feeling weak and lightheaded. Denies any fever, chills, headache, blurred vision, nausea, vomiting, chest pain, palpitations, orthopnea, PND, abdominal pain, dysuria (Makes little urine) or diarrhea.  Denies any muscle or joint aches. Patient admitted for further management.   Subjective:   Had a small bowel movement last night.  No shortness of breath.  Had some hypoglycemia this morning that has since resolved.  Later on in the day, she became very diaphoretic and blood sugar was noted to be elevated.  EKG did not show any acute findings.  She has since improved does not have any further complaints at this time.  Assessment  & Plan :    Principal Problem:   Supratherapeutic  INR No signs of bleeding. INR was greater than 10 despite patient being off Coumadin for almost 10 days. Received 2 units FFP with dialysis and finally became therapeutic 3 days into hospitalization. She was on amiodarone and also had poor by mouth intake both can Keep INR elevated. No other drug interaction. Also had mild transaminitis on admission labs.  Patient received 5 mg Coumadin (after INR therapeutic) INR again jumped to 8.5. Ordered another 2 unit FFP.  Holding Coumadin for now. -Her CHADS2vasc score is 6 With high risk of stroke without anticoagulation. -Workup for coagulopathy as noted below.  Coagulopathy Patient's DIC panel does show elevated PTT, D-dimer and low platelets. Also has transaminitis.  Transient schistocytes noted on smears.  LDH also elevated.  Patient seen by hematology, appreciate input.  Felt that her lab abnormalities and coagulopathy may be related to liver disease as opposed to DIC.  It was recommended to continue observation of the patient's labs and consider giving vitamin K for elevated INR.  Coumadin was not felt to be a good choice in this patient and recommendations were to transition patient to Xarelto or Eliquis.  Discussed with cardiology, and Eliquis felt to be reasonable choice.  We will start this once INR has drifted down..  Elevated troponin Possibly associated with ESRD. No EKG changes or chest pain symptoms.Troponin peaked at 1.71. Cardiology consult appreciated. 2-D  echo from 3 months back with EF of 35 and 35% and grade 2 diastolic dysfunction. Also showed mid inferior and basal mid inferior lateral hypokinesis. Continue current home medications.  ESRD on hemodialysis Nephrology following for dialysis needs.  Atrial fibrillation Rate controlled. Coumadin held for Supratherapeutic INR again.  Amiodarone currently on hold due to elevated LFTs, once INR trends down we will start on Eliquis.  Coronary artery disease Continue Plavix.  Statin on hold due to elevated LFTs  Type 2 diabetes mellitus Lately, she has been refusing her Lantus.  This morning, she had episodes of significant hypoglycemia requiring dextrose administration.  Blood sugars have since improved.  Will reduce basal Lantus dose.  Was encouraged to eat more frequently.  Ischemic Cardiomyopathy No signs of volume overload. Management with dialysis.    Sacral decubitus ulcer stage II, Leg ulcer Care per nursing, frequent repositioning in bed. Has left shin split , no bleeding or infection, clean eschar over left heel, small deep tissue injury over rt heel. Wound care consulted.  Right elbow and left knee pain. Suspected acute gouty arthritis +/- right elbow bursitis.  Treated with a course of prednisone and has improved.  She also received Lidoderm patch over right elbow.X-ray unremarkable.     Failure to thrive in adult Has poor by mouth intake. Nutrition consult appreciated and added supplement. PT recommend SNF.  Depression After losing her husband recently. Offered antidepressants but patient refused.  Elevated liver enzymes. Etiology is unclear.  Ultrasound imaging did not show any significant abnormalities in liver.  Holding amiodarone and statin.  Bilirubin is noted to be normal.  Continue to monitor     Code Status :Full code  Family Communication  :None at bedside  Disposition Plan  : discharge to SNF when stable  Barriers For Discharge : Active symptoms  Consults   :  Nephrology, cardiology, hematology  Procedures  : None  DVT Prophylaxis  :  Supratherapeutic INR  Lab Results  Component Value Date   PLT 107 (L) 02/17/2017    Antibiotics  :    Anti-infectives (From admission, onward)  None        Objective:   Vitals:   02/17/17 1014 02/17/17 1441 02/17/17 1507 02/17/17 1631  BP:  112/61  (!) 121/54  Pulse:  91  76  Resp:  18  18  Temp:  98.3 F (36.8 C)  97.7 F (36.5 C)  TempSrc:  Oral  Oral  SpO2: 100% 100% 92%   Weight:      Height:        Wt Readings from Last 3 Encounters:  02/17/17 91.7 kg (202 lb 1.6 oz)  02/02/17 91.9 kg (202 lb 9.6 oz)  01/17/17 83.3 kg (183 lb 10.3 oz)     Intake/Output Summary (Last 24 hours) at 02/17/2017 1945 Last data filed at 02/17/2017 1700 Gross per 24 hour  Intake 821.67 ml  Output -  Net 821.67 ml     Physical Exam Gen.: Elderly female not in distress, appears fatigued HEENT: Pallor present,Moist mucosa, supple neck Chest: Clear to auscultation bilaterally CVS: Normal S1 and S2, no murmurs GI: Soft, nondistended, nontender Musculoskeletal.  These do not appear edematous, warm, swollen.  She does have some pain with active/passive motion of the knees.  Elbows do not appear acutely inflamed      Data Review:    CBC Recent Labs  Lab 02/11/17 0459  02/12/17 0414 02/14/17 0527 02/14/17 1333 02/16/17 0354 02/16/17 0830 02/17/17 0708  WBC 6.1   < > 6.5 6.4  --  5.3 5.4 5.7  HGB 9.9*   < > 10.0* 10.4*  --  9.7* 9.6* 9.6*  HCT 33.1*   < > 33.9* 35.4*  --  32.8* 32.8* 32.1*  PLT 163   < > 145* 123* 113* 98*  98* 105* 107*  MCV 95.4   < > 96.3 94.9  --  95.1 95.1 94.7  MCH 28.5   < > 28.4 27.9  --  28.1 27.8 28.3  MCHC 29.9*   < > 29.5* 29.4*  --  29.6* 29.3* 29.9*  RDW 20.8*   < > 21.3* 21.1*  --  21.2* 21.5* 21.4*  LYMPHSABS 0.7  --   --   --   --  0.9  --   --   MONOABS 0.4  --   --   --   --  0.3  --   --   EOSABS 0.0  --   --   --   --  0.0  --   --   BASOSABS  0.0  --   --   --   --  0.0  --   --    < > = values in this interval not displayed.    Chemistries  Recent Labs  Lab 02/12/17 0414 02/14/17 0416 02/15/17 0420 02/16/17 0354 02/17/17 0708  NA 134* 127* 132* 132* 134*  K 4.7 5.5* 4.8 6.0* 4.5  CL 95* 90* 94* 94* 95*  CO2 28 23 24 24 27   GLUCOSE 121* 105* 202* 139* 74  BUN 30* 63* 40* 55* 37*  CREATININE 3.89* 5.11* 3.72* 4.70* 3.59*  CALCIUM 8.4* 8.2* 8.6* 8.8* 8.7*  AST  --  161* 145*  --  78*  ALT  --  81* 78*  --  58*  ALKPHOS  --  239* 219*  --  213*  BILITOT  --  0.8 0.8  --  0.7   ------------------------------------------------------------------------------------------------------------------ No results for input(s): CHOL, HDL, LDLCALC, TRIG, CHOLHDL, LDLDIRECT in the last 72 hours.  Lab Results  Component Value  Date   HGBA1C 8.8 (H) 10/31/2016   ------------------------------------------------------------------------------------------------------------------ No results for input(s): TSH, T4TOTAL, T3FREE, THYROIDAB in the last 72 hours.  Invalid input(s): FREET3 ------------------------------------------------------------------------------------------------------------------ Recent Labs    02/16/17 0354  VITAMINB12 2,812*    Coagulation profile Recent Labs  Lab 02/14/17 0527 02/14/17 1333 02/15/17 0420 02/16/17 0354 02/17/17 0708  INR 7.39* REPEATED TO VERIFY 4.56* 4.95* 3.70    Recent Labs    02/16/17 0354  DDIMER 0.68*    Cardiac Enzymes Recent Labs  Lab 02/11/17 2055 02/12/17 0414 02/12/17 1023  TROPONINI 1.09* 1.09* 1.22*   ------------------------------------------------------------------------------------------------------------------    Component Value Date/Time   BNP 2,761.4 (H) 12/23/2016 1855    Inpatient Medications  Scheduled Meds: . ALPRAZolam  0.5 mg Oral QHS  . calcitRIOL  0.5 mcg Oral Daily  . clopidogrel  75 mg Oral Daily  . epoetin (EPOGEN/PROCRIT) injection   4,000 Units Intravenous Q T,Th,Sa-HD  . feeding supplement (NEPRO CARB STEADY)  237 mL Oral BID BM  . feeding supplement (PRO-STAT SUGAR FREE 64)  30 mL Oral BID  . ferrous sulfate  325 mg Oral Q breakfast  . fluticasone  1 spray Each Nare Daily  . insulin aspart  0-15 Units Subcutaneous TID WC  . insulin glargine  28 Units Subcutaneous Q2200  . isosorbide mononitrate  30 mg Oral BID  . latanoprost  1 drop Both Eyes QHS  . levalbuterol  0.63 mg Nebulization TID  . lidocaine  1 patch Transdermal Q24H  . loratadine  10 mg Oral Daily  . LORazepam  1 mg Intravenous Once  . meclizine  12.5 mg Oral BID  . mouth rinse  15 mL Mouth Rinse BID  . milk and molasses  1 enema Rectal Once  . pantoprazole  40 mg Oral Daily  . polyethylene glycol  17 g Oral Daily  . predniSONE  40 mg Oral Q breakfast  . sodium chloride flush  3 mL Intravenous Q12H  . vitamin A & D   Topical QID  . Warfarin - Pharmacist Dosing Inpatient   Does not apply Q24H   Continuous Infusions: . dextrose 5 % and 0.9% NaCl Stopped (02/17/17 1720)   PRN Meds:.acetaminophen **OR** acetaminophen, albuterol, cyclobenzaprine, heparin, heparin, ondansetron **OR** ondansetron (ZOFRAN) IV, senna-docusate  Micro Results No results found for this or any previous visit (from the past 240 hour(s)).  Radiology Reports Dg Chest 2 View  Result Date: 02/10/2017 CLINICAL DATA:  Cough and fatigue EXAM: CHEST  2 VIEW COMPARISON:  January 29, 2017 FINDINGS: There remains airspace consolidation in the right base with small pleural effusions bilaterally. There is cardiomegaly with pulmonary vascularity within normal limits. No adenopathy. Central catheter tip is at the cavoatrial junction. No pneumothorax. No evident bone lesions. IMPRESSION: Stable cardiac prominence with bilateral pleural effusions. Suspect superimposed pneumonia right base. Lungs elsewhere clear. Pulmonary vascularity appears within normal limits. No pneumothorax. Central  catheter position unchanged. Electronically Signed   By: Lowella Grip III M.D.   On: 02/10/2017 13:58   Dg Elbow 2 Views Right  Result Date: 02/12/2017 CLINICAL DATA:  71 year old female with right elbow pain. EXAM: RIGHT ELBOW - 2 VIEW COMPARISON:  03/04/2004 FINDINGS: Please note, this is a limited evaluation due to suboptimal positioning. There is no evidence of fracture, dislocation, or joint effusion. There is no evidence of arthropathy or other focal bone abnormality. Soft tissues are unremarkable. IMPRESSION: Limited study due to suboptimal positioning. No gross osseous or soft tissue abnormalities. Electronically Signed  By: Kristopher Oppenheim M.D.   On: 02/12/2017 09:32   Dg Knee 1-2 Views Left  Result Date: 02/12/2017 CLINICAL DATA:  71 year old female with left knee pain. EXAM: LEFT KNEE - 1-2 VIEW COMPARISON:  None. FINDINGS: No evidence of fracture, dislocation, or joint effusion. No focal bone abnormality. There are mild degenerative changes with chondrocalcinosis noted. Soft tissues are unremarkable. IMPRESSION: No acute osseous abnormality. Electronically Signed   By: Kristopher Oppenheim M.D.   On: 02/12/2017 09:31   US Abdomen Complete  Result Date: 02/15/2017 CLINICAL DATA:  Transaminitis EXAM: ABDOMEN ULTRASOUND COMPLETE COMPARISON:  CT abdomen and pelvis March 20, 2016 FINDINGS: Gallbladder: Within the gallbladder, there are multiple echogenic foci which move and shadow consistent with cholelithiasis. Largest gallstone measures 7 mm in length. In addition, there is a 3 mm echogenic focus which neither moves nor shadows, a presumed small polyp. Gallbladder wall is upper normal in thickness without edema or pericholecystic fluid. No sonographic Murphy sign noted by sonographer. Common bile duct: Diameter: 4 mm. No intrahepatic, common hepatic, or common bile duct dilatation. Liver: No focal lesion identified. Within normal limits in parenchymal echogenicity. Portal vein is patent on  color Doppler imaging with normal direction of blood flow towards the liver. IVC: No abnormality visualized. Pancreas: Visualized portion unremarkable. Portions of the pancreas are obscured by gas. Spleen: Size and appearance within normal limits. Right Kidney: Length: 10.9 cm. Echogenicity within normal limits. No hydronephrosis visualized. There is a cyst in the mid right kidney measuring 1.2 x 0.8 x 0.9 cm Left Kidney: Length: 11.1 cm. Echogenicity within normal limits. No mass or hydronephrosis visualized. Abdominal aorta: No aneurysm visualized. Other findings: No demonstrable ascites. There are pleural effusions bilaterally. IMPRESSION: 1. Cholelithiasis. There is also a 3 mm polyp within the gallbladder. 2. Portions of pancreas obscured by gas. Visualized portions of pancreas appear normal. 3.  Small cyst in mid right kidney. 4.  Pleural effusions bilaterally. Electronically Signed   By: Lowella Grip III M.D.   On: 02/15/2017 15:43   Dg Chest Port 1 View  Result Date: 01/29/2017 CLINICAL DATA:  Increasing weakness after dialysis this afternoon. EXAM: PORTABLE CHEST 1 VIEW COMPARISON:  12/26/2016 FINDINGS: Interval placement of a right central venous catheter with tip over the cavoatrial junction region. No pneumothorax. Cardiac enlargement with pulmonary vascular congestion. Bilateral pleural effusions with basilar atelectasis. No significant change since previous study. IMPRESSION: Cardiac enlargement, pulmonary vascular congestion, bilateral pleural effusions, and basilar atelectasis similar to previous study. Electronically Signed   By: Lucienne Capers M.D.   On: 01/29/2017 01:09    Time Spent in minutes  25   Kathie Dike M.D on 02/17/2017 at 7:44 PM  Between 7am to 7pm - Pager [phone]  After 7pm go to [email or web address] - password [facility]  [facility] -  Office  [phone]

## 2017-02-17 NOTE — Progress Notes (Addendum)
Patient diaphoretic, states she feels " Hot", Vitals: b/p 121/54, pulse: 76 resp: 18, temp:97.7. Patient room temp turned down and opened window. Patient asked to remove socks, socks removed.  MD notified, stat EKG ordered and check CBG.

## 2017-02-17 NOTE — Progress Notes (Signed)
Patient lethargic, easily arousalble. Patient blood sugar 165 after dose of D50, B/p 113/52, pulse 82, SPO2: 100% on 2L. Patient not eating and drinking, notified MD. MD started IV fluids 50 ml/hr 0.9% NS with D5.

## 2017-02-17 NOTE — Progress Notes (Signed)
Pt's hands are very cold. Heated hands with warm cloth to get O2 reading

## 2017-02-17 NOTE — Progress Notes (Signed)
[name]  MRN: [id number]  DOB/AGE: 07-Nov-1945 71 y.o.  Primary Care Physician:Hill, Berneta Sages, MD  Admit date: 02/10/2017  Chief Complaint:  Chief Complaint  Patient presents with  . Weakness    S-Pt presented on  02/10/2017 with  Chief Complaint  Patient presents with  . Weakness  .    Pt offers no new complaints.pt RN was concerned that pt is more lethargic than baseline, on review of the medications-Pt got Benzo late last night at 11;30ish.   Meds  . ALPRAZolam  0.5 mg Oral QHS  . calcitRIOL  0.5 mcg Oral Daily  . clopidogrel  75 mg Oral Daily  . epoetin (EPOGEN/PROCRIT) injection  4,000 Units Intravenous Q T,Th,Sa-HD  . feeding supplement (NEPRO CARB STEADY)  237 mL Oral BID BM  . feeding supplement (PRO-STAT SUGAR FREE 64)  30 mL Oral BID  . ferrous sulfate  325 mg Oral Q breakfast  . fluticasone  1 spray Each Nare Daily  . insulin aspart  0-15 Units Subcutaneous TID WC  . insulin glargine  28 Units Subcutaneous Q2200  . isosorbide mononitrate  30 mg Oral BID  . latanoprost  1 drop Both Eyes QHS  . levalbuterol  0.63 mg Nebulization TID  . lidocaine  1 patch Transdermal Q24H  . loratadine  10 mg Oral Daily  . LORazepam  1 mg Intravenous Once  . meclizine  12.5 mg Oral BID  . mouth rinse  15 mL Mouth Rinse BID  . milk and molasses  1 enema Rectal Once  . pantoprazole  40 mg Oral Daily  . polyethylene glycol  17 g Oral Daily  . predniSONE  40 mg Oral Q breakfast  . sodium chloride flush  3 mL Intravenous Q12H  . vitamin A & D   Topical QID  . Warfarin - Pharmacist Dosing Inpatient   Does not apply Q24H       Physical Exam: Vital signs in last 24 hours: Temp:  [97.5 F (36.4 C)-99 F (37.2 C)] 98.5 F (36.9 C) (12/14 0622) Pulse Rate:  [60-86] 60 (12/14 0622) Resp:  [16-20] 16 (12/14 0622) BP: (106-128)/(47-63) 128/55 (12/14 0622) SpO2:  [80 %-97 %] 80 % (12/14 0622) Weight:  [202 lb 1.6 oz (91.7 kg)] 202 lb 1.6 oz (91.7 kg) (12/14 0907) Weight  change: -0.6 oz (-0.017 kg) Last BM Date: 02/14/17  Intake/Output from previous day: 12/13 0701 - 12/14 0700 In: 600 [P.O.:600] Out: 2000  Total I/O In: 360 [P.O.:360] Out: -    Physical Exam: General- pt is lethargic but easily arousable and follows commands. Resp- No acute REsp distress,Minimal  Rhonchi CVS- S1S2 regular in rate and rhythm GIT- BS+, soft, NT, ND EXT- 1+ LE Edema, NO Cyanosis Access-PC in situ  Lab Results: CBC Recent Labs    02/16/17 0830 02/17/17 0708  WBC 5.4 5.7  HGB 9.6* 9.6*  HCT 32.8* 32.1*  PLT 105* 107*    BMET Recent Labs    02/16/17 0354 02/17/17 0708  NA 132* 134*  K 6.0* 4.5  CL 94* 95*  CO2 24 27  GLUCOSE 139* 74  BUN 55* 37*  CREATININE 4.70* 3.59*  CALCIUM 8.8* 8.7*    MICRO No results found for this or any previous visit (from the past 240 hour(s)).    Lab Results  Component Value Date   PTH 270 (H) 12/27/2016   CALCIUM 8.7 (L) 02/17/2017   PHOS 4.3 02/17/2017      Impression: 1)Renal  ESRD  on HD                Pt is on Tuesday/Thursday/Saturday schedule                Pt was last dialyzed yesterday                No need of HD today  2)HTN On Vasodilators.   3)Anemia In ESRD the goal for HGb is 9--11. hgb at goal  4)CKD Mineral-Bone Disorder PTH acceptable. Secondary Hyperparathyroidismpresent.. Phosphorus at goal.   5)CVS-admitted with Elevated trops Has hx of Afib PMD following  6)Electrolytes  Hyperkalemic    Sec to ESRD     better  Hyponatremic    Sec to ESRD    better  7)Acid base Co2 at goal   8) Pharma-Admitted with supra therapeutic INR    Primary team and Pharmacy following  Plan:   Will continue current care Will dialyze in am Will use 2k bath Will keep on epo  Kristopher Attwood S 02/17/2017, 9:51 AM

## 2017-02-17 NOTE — Progress Notes (Addendum)
Hypoglycemic Event  CBG: 41  Treatment: Juice, soda and carb   Symptoms: lethargic, easily arousable, amp of D50 per Dr orders.   Follow-up JJK:[id number] CBG Result:165  Possible Reasons for Event: patient not eating enough.   Comments/MD notified:    [name]

## 2017-02-17 NOTE — Progress Notes (Signed)
ANTICOAGULATION CONSULT NOTE - follow up  Pharmacy Consult for Warfarin Indication: atrial fibrillation  Allergies  Allergen Reactions  . Ace Inhibitors Cough  . Amlodipine Swelling    UNSPECIFIED EDEMA   . Codeine Rash   Patient Measurements: Height: 5\' 8"  (172.7 cm) Weight: 202 lb 1.6 oz (91.7 kg) IBW/kg (Calculated) : 63.9  Vital Signs: Temp: 98.5 F (36.9 C) (12/14 0622) Temp Source: Oral (12/14 0622) BP: 113/52 (12/14 0955) Pulse Rate: 82 (12/14 0955)  Labs: Recent Labs    02/14/17 1333 02/15/17 0420  02/16/17 0354 02/16/17 0830 02/17/17 0708  HGB  --   --    < > 9.7* 9.6* 9.6*  HCT  --   --   --  32.8* 32.8* 32.1*  PLT 113*  --   --  98*  98* 105* 107*  APTT 144*  --   --  72*  --   --   LABPROT 67.7* 42.9*  --  45.7*  --  36.4*  INR REPEATED TO VERIFY 4.56*  --  4.95*  --  3.70  CREATININE  --  3.72*  --  4.70*  --  3.59*   < > = values in this interval not displayed.   Estimated Creatinine Clearance: 16.8 mL/min (A) (by C-G formula based on SCr of 3.59 mg/dL (H)).  Medical History: Past Medical History:  Diagnosis Date  . Acute renal failure superimposed on stage 4 chronic kidney disease ([place]) 07/04/2015  . Allergic rhinitis   . Anemia   . Anxiety   . CAD in native artery    NSTEMI 07/2015 s/p DES to RCA and posterior PDA, PCI 10/2015 with scoring balloon to 85% ISR of distal RCA), known LAD/Cx disease treated medically  . Carotid artery disease ([place])    Mild bilateral carotid disease (1-39% 07/2015)  . Chronic combined systolic and diastolic CHF (congestive heart failure) ([place])   . CKD (chronic kidney disease), stage IV ([place])   . Constipation   . Essential hypertension   . GERD (gastroesophageal reflux disease)   . History of hysterectomy   . Hyperlipidemia   . Low back pain   . Obesity   . 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 Osteoarthritis   . Overactive bladder   .  PVC's (premature ventricular contractions)   . Type 2 diabetes mellitus ([facility])    Medications:  Medications Prior to Admission  Medication Sig Dispense Refill Last Dose  . Insulin Glargine (LANTUS) 100 UNIT/ML Solostar Pen Inject 30 Units into the skin daily at 10 pm.     . insulin lispro (HUMALOG) 100 UNIT/ML KiwkPen Inject 5-11 Units into the skin 3 (three) times daily before meals.     Marland Kitchen albuterol (PROVENTIL HFA;VENTOLIN HFA) 108 (90 Base) MCG/ACT inhaler Inhale 1-2 puffs into the lungs every 6 (six) hours as needed for wheezing or shortness of breath. 1 Inhaler 0 unknnown at prn  . albuterol (PROVENTIL) (2.5 MG/3ML) 0.083% nebulizer solution Take 3 mLs (2.5 mg total) by nebulization every 6 (six) hours as needed for wheezing or shortness of breath. 75 mL 1 unknown at prn  . ALPRAZolam (XANAX) 0.5 MG tablet Take 0.5 mg by mouth at bedtime.   12/22/2016  . amiodarone (PACERONE) 200 MG tablet Take 1 tablet (200 mg total) by mouth daily. 30 tablet 0   . calcitRIOL (ROCALTROL) 0.5 MCG capsule Take 1 capsule (0.5 mcg total) daily by mouth. 30 capsule  0   . cetirizine (ZYRTEC) 10 MG tablet Take 5 mg by mouth daily.  3 12/23/2016  . clopidogrel (PLAVIX) 75 MG tablet Take 1 tablet (75 mg total) by mouth daily. 30 tablet 0 12/23/2016  . cyclobenzaprine (FLEXERIL) 5 MG tablet Take 5 mg by mouth as needed (lower back pain). osteoporosis with out current pathological fracture for 14 days  0 unknown at prn  . ferrous sulfate 325 (65 FE) MG tablet Take 325 mg by mouth daily with breakfast.    12/23/2016  . fluticasone (FLONASE) 50 MCG/ACT nasal spray Place 1 spray into both nostrils daily.   12/23/2016  . isosorbide mononitrate (IMDUR) 30 MG 24 hr tablet Take 1 tablet (30 mg total) 2 (two) times daily by mouth. 60 tablet 0   . latanoprost (XALATAN) 0.005 % ophthalmic solution Place 1 drop into both eyes at bedtime.    12/22/2016  . meclizine (ANTIVERT) 25 MG tablet Take 12.5 mg by mouth 2 (two) times daily.     12/23/2016  . nitroGLYCERIN (NITROSTAT) 0.4 MG SL tablet Place 1 tablet (0.4 mg total) under the tongue every 5 (five) minutes as needed for chest pain. 25 tablet 2 unknown at prn  . pantoprazole (PROTONIX) 40 MG tablet Take 40 mg by mouth daily.    12/23/2016  . rosuvastatin (CRESTOR) 40 MG tablet Take 1 tablet (40 mg total) by mouth at bedtime. 30 tablet 0 12/22/2016   Assessment: No signs of bleeding. INR high on admission despite she being off Coumadin for last 10 days.  Pt received 2 units FFP with dialysis on 12/8 and again on 12/11.   INR was therapeutic on 12/9 after FFP.  Coumadin dose given on 12/9 and then INR > 7 again.   Abnormal DIC panel noted. Hematology consulted 12/13 and felt that her lab abnormalities and coagulopathy may be related to liver disease as opposed to DIC. In addiition , Dr. Talbert Cage recommended xarelto or eliquis for afib. Dr. Freddy Jaksch to d/w cardiology.  INR is trending down.  Goal of Therapy:  INR 2-3   Plan:  Continue to hold coumadin Daily PT/INR F/U plan Monitor for signs and symptoms of bleeding.   Isac Sarna, BS Pharm D, BCPS Clinical Pharmacist Pager [phone] 02/17/2017,10:38 AM

## 2017-02-18 LAB — PROTIME-INR
INR: 3.74
Prothrombin Time: 36.7 seconds — ABNORMAL HIGH (ref 11.4–15.2)

## 2017-02-18 LAB — COMPREHENSIVE METABOLIC PANEL
ALBUMIN: 2.3 g/dL — AB (ref 3.5–5.0)
ALT: 47 U/L (ref 14–54)
ANION GAP: 13 (ref 5–15)
AST: 45 U/L — AB (ref 15–41)
Alkaline Phosphatase: 209 U/L — ABNORMAL HIGH (ref 38–126)
BUN: 63 mg/dL — ABNORMAL HIGH (ref 6–20)
CALCIUM: 8.8 mg/dL — AB (ref 8.9–10.3)
CHLORIDE: 91 mmol/L — AB (ref 101–111)
CO2: 24 mmol/L (ref 22–32)
Creatinine, Ser: 4.39 mg/dL — ABNORMAL HIGH (ref 0.44–1.00)
GFR calc non Af Amer: 9 mL/min — ABNORMAL LOW (ref >60)
GFR, EST AFRICAN AMERICAN: 11 mL/min — AB (ref >60)
Glucose, Bld: 193 mg/dL — ABNORMAL HIGH (ref 65–99)
Potassium: 5.1 mmol/L (ref 3.5–5.1)
Sodium: 128 mmol/L — ABNORMAL LOW (ref 135–145)
Total Bilirubin: 0.8 mg/dL (ref 0.3–1.2)
Total Protein: 6.8 g/dL (ref 6.5–8.1)

## 2017-02-18 LAB — GLUCOSE, CAPILLARY
GLUCOSE-CAPILLARY: 95 mg/dL (ref 65–99)
Glucose-Capillary: 229 mg/dL — ABNORMAL HIGH (ref 65–99)
Glucose-Capillary: 218 mg/dL — ABNORMAL HIGH (ref 65–99)
Glucose-Capillary: 118 mg/dL — ABNORMAL HIGH (ref 65–99)
Glucose-Capillary: 81 mg/dL (ref 65–99)

## 2017-02-18 LAB — CBC
HEMATOCRIT: 31.1 % — AB (ref 36.0–46.0)
HEMOGLOBIN: 9.3 g/dL — AB (ref 12.0–15.0)
MCH: 28 pg (ref 26.0–34.0)
MCHC: 29.9 g/dL — ABNORMAL LOW (ref 30.0–36.0)
MCV: 93.7 fL (ref 78.0–100.0)
Platelets: 111 10*3/uL — ABNORMAL LOW (ref 150–400)
RBC: 3.32 MIL/uL — AB (ref 3.87–5.11)
RDW: 21.2 % — ABNORMAL HIGH (ref 11.5–15.5)
WBC: 5.7 10*3/uL (ref 4.0–10.5)

## 2017-02-18 MED ORDER — MILK AND MOLASSES ENEMA: 1.0000 | Freq: Once | RECTAL | Status: DC

## 2017-02-18 MED ORDER — PHYTONADIONE 5 MG PO TABS: 5.0000 mg | ORAL_TABLET | Freq: Once | ORAL | Status: DC

## 2017-02-18 MED ORDER — EPOETIN ALFA 4000 UNIT/ML IJ SOLN
INTRAMUSCULAR | Status: AC
  Administered 2017-02-18: 4000 [IU] via INTRAVENOUS
  Filled 2017-02-18: qty 1

## 2017-02-18 MED ORDER — HEPARIN SODIUM (PORCINE) 1000 UNIT/ML IJ SOLN
INTRAMUSCULAR | Status: AC
  Administered 2017-02-18: 1000 [IU] via INTRAVENOUS_CENTRAL
  Filled 2017-02-18: qty 4

## 2017-02-18 MED ORDER — PHYTONADIONE 5 MG PO TABS
10.0000 mg | ORAL_TABLET | Freq: Once | ORAL | Status: AC
  Administered 2017-02-18: 10 mg via ORAL
  Filled 2017-02-18 (×2): qty 2

## 2017-02-18 NOTE — Progress Notes (Signed)
Patient received dialysis today without complications. Vitals within normal limits and fluid goal met.

## 2017-02-18 NOTE — Progress Notes (Signed)
Subjective: Interval History: Patient complains of feeling sleepy other wise feels better  Objective: Vital signs in last 24 hours: Temp:  [97.1 F (36.2 C)-98.3 F (36.8 C)] 97.1 F (36.2 C) (12/15 0504) Pulse Rate:  [67-91] 67 (12/15 0504) Resp:  [18] 18 (12/15 0504) BP: (106-121)/(48-61) 106/48 (12/15 0504) SpO2:  [92 %-100 %] 100 % (12/15 0504) Weight:  [91.6 kg (201 lb 15.1 oz)] 91.6 kg (201 lb 15.1 oz) (12/15 0500) Weight change: -0.028 kg (-1 oz)  Intake/Output from previous day: 12/14 0701 - 12/15 0700 In: 898.3 [P.O.:540; I.V.:358.3] Out: -  Intake/Output this shift: No intake/output data recorded.  General appearance: alert, cooperative and no distress Resp: clear to auscultation bilaterally Cardio: regular rate and rhythm Extremities: No edema  Lab Results: Recent Labs    02/16/17 0830 02/17/17 0708  WBC 5.4 5.7  HGB 9.6* 9.6*  HCT 32.8* 32.1*  PLT 105* 107*   BMET:  Recent Labs    02/16/17 0354 02/17/17 0708  NA 132* 134*  K 6.0* 4.5  CL 94* 95*  CO2 24 27  GLUCOSE 139* 74  BUN 55* 37*  CREATININE 4.70* 3.59*  CALCIUM 8.8* 8.7*   No results for input(s): PTH in the last 72 hours. Iron Studies: No results for input(s): IRON, TIBC, TRANSFERRIN, FERRITIN in the last 72 hours.  Studies/Results: No results found.  I have reviewed the patient's current medications.  Assessment/Plan: 1] end-stage renal disease: She is s/p hemodialysis on Thursday. She is asymptomatic and potassium is normal 2] anemia: Her hemoglobin is below our target goal,but stable. She is on Epogen  3] bone and mineral disorder: Her calcium and phosphorus is range. Not on binder 4] hypertension: Her blood pressure is reasonably controlled 5] fluid management: No sign of fluid overload. 6] feeling tired and weak.  Patient is improving. 7] history of anxiety disorder: Feeling better but complains of feeling sleepy and drowsy Plan: 1 We will dialyze patient today 2] if her  blood pressure tolerates we will remove about 2 L. 3] we will check her CBC renal panel in the morning.    LOS: 8 days   Avery Eustice S 02/18/2017,9:22 AM

## 2017-02-18 NOTE — Progress Notes (Signed)
PROGRESS NOTE                                                                                                                                                                                                             Patient Demographics:    [name], is a 71 y.o. female, DOB - Jul 01, 1945, WCH:[id number]  Admit date - 02/10/2017   Admitting Physician Louellen Molder, MD  Outpatient Primary MD for the patient is [name], MD  LOS - 8  Outpatient Specialists:Nephrology  Chief Complaint  Patient presents with  . Weakness       Brief Narrative   71 year old female with ischemic Cardiomyopathy, coronary artery disease with history of STEMI in 2017 stent placement, ESRD on dialysis (recently started, tu, th, sat  Schedule), Hypertension, hyperlipidemia, type 2 diabetes mellitus, A. Fib on anticoagulation presented with generalized weakness for past few weeks. She was hospitalized 2 weeks back for hypoglycemia secondary to poor by mouth intake and her insulin was stopped. Her INR also has been supratherapeutic for last several days and Her Coumadin was stopped about 10 days back When INR was checked during dialysis. Patient recently lost her husband and has been feeling low with poor appetite and generalized weakness. Her last dialysis session was also incomplete as patient reached late for her session.Despite stopping the Coumadin her INR Did not return to normal. She had her INR checked on 12/7 and was found to be >10. Patient was sent to the ED. Vitals in the ED was stable but had elevated troponin 1.68.Cardiology was consulted who recommended this was On likely due to ACS.Patient admitted for further management. She reports feeling weak and lightheaded. Denies any fever, chills, headache, blurred vision, nausea, vomiting, chest pain, palpitations, orthopnea, PND, abdominal pain, dysuria (Makes little urine) or diarrhea.  Denies any muscle or joint aches. Patient admitted for further management.   Subjective:   No bowel movement today.  Complains of her hands being cold.  Denies any numbness.  Feels that her hands are stiff.  Feels tired after dialysis today.  Assessment  & Plan :    Principal Problem:   Supratherapeutic INR No signs of bleeding. INR was greater than 10 despite patient being off Coumadin for almost 10 days. Received 2 units FFP with dialysis and finally became therapeutic 3 days into hospitalization. She  was on amiodarone and also had poor by mouth intake both can Keep INR elevated. No other drug interaction. Also had mild transaminitis on admission labs.  Patient received 5 mg Coumadin (after INR therapeutic) INR again jumped to 8.5. Ordered another 2 unit FFP.  Holding Coumadin for now. -Her CHADS2vasc score is 6 With high risk of stroke without anticoagulation. -Workup for coagulopathy as noted below.  Coagulopathy Patient's DIC panel does show elevated PTT, D-dimer and low platelets. Also has transaminitis.  Transient schistocytes noted on smears.  LDH also elevated.  Patient seen by hematology, appreciate input.  Felt that her lab abnormalities and coagulopathy may be related to liver disease as opposed to DIC.  It was recommended to continue observation of the patient's labs and consider giving vitamin K for elevated INR.  Coumadin was not felt to be a good choice in this patient and recommendations were to transition patient to Xarelto or Eliquis.  Discussed with cardiology, and Eliquis felt to be reasonable choice.  Since INR does not appear to be adequately trending down, will give a dose of vitamin K.  Once INR is below 2, we will start on Eliquis.  Elevated troponin Possibly associated with ESRD. No EKG changes or chest pain symptoms.Troponin peaked at 1.71. Cardiology consult appreciated. 2-D echo from 3 months back with EF of 35 and 35% and grade 2 diastolic dysfunction. Also  showed mid inferior and basal mid inferior lateral hypokinesis. Continue current home medications.  ESRD on hemodialysis Nephrology following for dialysis needs.  Atrial fibrillation Rate controlled. Coumadin held for Supratherapeutic INR again.  Amiodarone currently on hold due to elevated LFTs, once INR trends down we will start on Eliquis.  Coronary artery disease Continue Plavix.  Statin on hold due to elevated LFTs  Type 2 diabetes mellitus Episodes of hypoglycemia were better today.  Lantus dose has been reduced.  Blood sugars remained stable today.  Ischemic Cardiomyopathy No signs of volume overload. Management with dialysis.    Sacral decubitus ulcer stage II, Leg ulcer Care per nursing, frequent repositioning in bed. Has left shin split , no bleeding or infection, clean eschar over left heel, small deep tissue injury over rt heel. Wound care consulted.  Right elbow and left knee pain. Suspected acute gouty arthritis +/- right elbow bursitis.  Treated with a course of prednisone and has improved.  She also received Lidoderm patch over right elbow.X-ray unremarkable.     Failure to thrive in adult Has poor by mouth intake. Nutrition consult appreciated and added supplement. PT recommend SNF.  Depression After losing her husband recently. Offered antidepressants but patient refused.  Elevated liver enzymes. Etiology is unclear.  Ultrasound imaging did not show any significant abnormalities in liver.  Holding amiodarone and statin.  Bilirubin is noted to be normal.  Overall enzymes are trending down    Code Status :Full code  Family Communication  :None at bedside  Disposition Plan  : discharge to SNF when stable  Barriers For Discharge : Active symptoms  Consults  :  Nephrology, cardiology, hematology  Procedures  : None  DVT Prophylaxis  :  Supratherapeutic INR  Lab Results  Component Value Date   PLT 111 (L) 02/18/2017    Antibiotics  :     Anti-infectives (From admission, onward)   None        Objective:   Vitals:   02/18/17 1500 02/18/17 1530 02/18/17 1600 02/18/17 1635  BP: (!) 118/52 (!) 102/48 (!) 103/46 (!) 99/51  Pulse: 73 74 69 70  Resp:    20  Temp:    97.6 F (36.4 C)  TempSrc:    Axillary  SpO2:      Weight:      Height:        Wt Readings from Last 3 Encounters:  02/18/17 91.6 kg (201 lb 15.1 oz)  02/02/17 91.9 kg (202 lb 9.6 oz)  01/17/17 83.3 kg (183 lb 10.3 oz)     Intake/Output Summary (Last 24 hours) at 02/18/2017 1819 Last data filed at 02/18/2017 1700 Gross per 24 hour  Intake 556.67 ml  Output 2000 ml  Net -1443.33 ml     Physical Exam Gen.: Elderly female not in distress, appears fatigued HEENT: Pallor present,Moist mucosa, supple neck Chest: Clear to auscultation bilaterally CVS: Normal S1 and S2, no murmurs GI: Soft, nondistended, nontender Musculoskeletal.  Arms are edematous bilaterally.  Due to edema, pulses are difficult to palpate.  Cap refill intact.  No cyanosis noted in fingertips.  Neurologically intact      Data Review:    CBC Recent Labs  Lab 02/14/17 0527 02/14/17 1333 02/16/17 0354 02/16/17 0830 02/17/17 0708 02/18/17 1237  WBC 6.4  --  5.3 5.4 5.7 5.7  HGB 10.4*  --  9.7* 9.6* 9.6* 9.3*  HCT 35.4*  --  32.8* 32.8* 32.1* 31.1*  PLT 123* 113* 98*  98* 105* 107* 111*  MCV 94.9  --  95.1 95.1 94.7 93.7  MCH 27.9  --  28.1 27.8 28.3 28.0  MCHC 29.4*  --  29.6* 29.3* 29.9* 29.9*  RDW 21.1*  --  21.2* 21.5* 21.4* 21.2*  LYMPHSABS  --   --  0.9  --   --   --   MONOABS  --   --  0.3  --   --   --   EOSABS  --   --  0.0  --   --   --   BASOSABS  --   --  0.0  --   --   --     Chemistries  Recent Labs  Lab 02/14/17 0416 02/15/17 0420 02/16/17 0354 02/17/17 0708 02/18/17 1237  NA 127* 132* 132* 134* 128*  K 5.5* 4.8 6.0* 4.5 5.1  CL 90* 94* 94* 95* 91*  CO2 23 24 24 27 24   GLUCOSE 105* 202* 139* 74 193*  BUN 63* 40* 55* 37* 63*   CREATININE 5.11* 3.72* 4.70* 3.59* 4.39*  CALCIUM 8.2* 8.6* 8.8* 8.7* 8.8*  AST 161* 145*  --  78* 45*  ALT 81* 78*  --  58* 47  ALKPHOS 239* 219*  --  213* 209*  BILITOT 0.8 0.8  --  0.7 0.8   ------------------------------------------------------------------------------------------------------------------ No results for input(s): CHOL, HDL, LDLCALC, TRIG, CHOLHDL, LDLDIRECT in the last 72 hours.  Lab Results  Component Value Date   HGBA1C 8.8 (H) 10/31/2016   ------------------------------------------------------------------------------------------------------------------ No results for input(s): TSH, T4TOTAL, T3FREE, THYROIDAB in the last 72 hours.  Invalid input(s): FREET3 ------------------------------------------------------------------------------------------------------------------ Recent Labs    02/16/17 0354  VITAMINB12 2,812*    Coagulation profile Recent Labs  Lab 02/14/17 1333 02/15/17 0420 02/16/17 0354 02/17/17 0708 02/18/17 1237  INR REPEATED TO VERIFY 4.56* 4.95* 3.70 3.74    Recent Labs    02/16/17 0354  DDIMER 0.68*    Cardiac Enzymes Recent Labs  Lab 02/11/17 2055 02/12/17 0414 02/12/17 1023  TROPONINI 1.09* 1.09* 1.22*   ------------------------------------------------------------------------------------------------------------------    Component Value Date/Time  BNP 2,761.4 (H) 12/23/2016 1855    Inpatient Medications  Scheduled Meds: . ALPRAZolam  0.5 mg Oral QHS  . calcitRIOL  0.5 mcg Oral Daily  . clopidogrel  75 mg Oral Daily  . epoetin (EPOGEN/PROCRIT) injection  4,000 Units Intravenous Q T,Th,Sa-HD  . feeding supplement (NEPRO CARB STEADY)  237 mL Oral BID BM  . feeding supplement (PRO-STAT SUGAR FREE 64)  30 mL Oral BID  . ferrous sulfate  325 mg Oral Q breakfast  . fluticasone  1 spray Each Nare Daily  . insulin aspart  0-15 Units Subcutaneous TID WC  . insulin glargine  10 Units Subcutaneous Q2200  . isosorbide  mononitrate  30 mg Oral BID  . latanoprost  1 drop Both Eyes QHS  . levalbuterol  0.63 mg Nebulization TID  . lidocaine  1 patch Transdermal Q24H  . loratadine  10 mg Oral Daily  . LORazepam  1 mg Intravenous Once  . meclizine  12.5 mg Oral BID  . mouth rinse  15 mL Mouth Rinse BID  . milk and molasses  1 enema Rectal Once  . pantoprazole  40 mg Oral Daily  . phytonadione  10 mg Oral Once  . polyethylene glycol  17 g Oral Daily  . sodium chloride flush  3 mL Intravenous Q12H  . vitamin A & D   Topical QID  . Warfarin - Pharmacist Dosing Inpatient   Does not apply Q24H   Continuous Infusions:  PRN Meds:.acetaminophen **OR** acetaminophen, albuterol, cyclobenzaprine, heparin, heparin, ondansetron **OR** ondansetron (ZOFRAN) IV, senna-docusate  Micro Results No results found for this or any previous visit (from the past 240 hour(s)).  Radiology Reports Dg Chest 2 View  Result Date: 02/10/2017 CLINICAL DATA:  Cough and fatigue EXAM: CHEST  2 VIEW COMPARISON:  January 29, 2017 FINDINGS: There remains airspace consolidation in the right base with small pleural effusions bilaterally. There is cardiomegaly with pulmonary vascularity within normal limits. No adenopathy. Central catheter tip is at the cavoatrial junction. No pneumothorax. No evident bone lesions. IMPRESSION: Stable cardiac prominence with bilateral pleural effusions. Suspect superimposed pneumonia right base. Lungs elsewhere clear. Pulmonary vascularity appears within normal limits. No pneumothorax. Central catheter position unchanged. Electronically Signed   By: Lowella Grip III M.D.   On: 02/10/2017 13:58   Dg Elbow 2 Views Right  Result Date: 02/12/2017 CLINICAL DATA:  71 year old female with right elbow pain. EXAM: RIGHT ELBOW - 2 VIEW COMPARISON:  03/04/2004 FINDINGS: Please note, this is a limited evaluation due to suboptimal positioning. There is no evidence of fracture, dislocation, or joint effusion. There is no  evidence of arthropathy or other focal bone abnormality. Soft tissues are unremarkable. IMPRESSION: Limited study due to suboptimal positioning. No gross osseous or soft tissue abnormalities. Electronically Signed   By: Kristopher Oppenheim M.D.   On: 02/12/2017 09:32   Dg Knee 1-2 Views Left  Result Date: 02/12/2017 CLINICAL DATA:  71 year old female with left knee pain. EXAM: LEFT KNEE - 1-2 VIEW COMPARISON:  None. FINDINGS: No evidence of fracture, dislocation, or joint effusion. No focal bone abnormality. There are mild degenerative changes with chondrocalcinosis noted. Soft tissues are unremarkable. IMPRESSION: No acute osseous abnormality. Electronically Signed   By: Kristopher Oppenheim M.D.   On: 02/12/2017 09:31   US Abdomen Complete  Result Date: 02/15/2017 CLINICAL DATA:  Transaminitis EXAM: ABDOMEN ULTRASOUND COMPLETE COMPARISON:  CT abdomen and pelvis March 20, 2016 FINDINGS: Gallbladder: Within the gallbladder, there are multiple echogenic foci which move and  shadow consistent with cholelithiasis. Largest gallstone measures 7 mm in length. In addition, there is a 3 mm echogenic focus which neither moves nor shadows, a presumed small polyp. Gallbladder wall is upper normal in thickness without edema or pericholecystic fluid. No sonographic Murphy sign noted by sonographer. Common bile duct: Diameter: 4 mm. No intrahepatic, common hepatic, or common bile duct dilatation. Liver: No focal lesion identified. Within normal limits in parenchymal echogenicity. Portal vein is patent on color Doppler imaging with normal direction of blood flow towards the liver. IVC: No abnormality visualized. Pancreas: Visualized portion unremarkable. Portions of the pancreas are obscured by gas. Spleen: Size and appearance within normal limits. Right Kidney: Length: 10.9 cm. Echogenicity within normal limits. No hydronephrosis visualized. There is a cyst in the mid right kidney measuring 1.2 x 0.8 x 0.9 cm Left Kidney: Length:  11.1 cm. Echogenicity within normal limits. No mass or hydronephrosis visualized. Abdominal aorta: No aneurysm visualized. Other findings: No demonstrable ascites. There are pleural effusions bilaterally. IMPRESSION: 1. Cholelithiasis. There is also a 3 mm polyp within the gallbladder. 2. Portions of pancreas obscured by gas. Visualized portions of pancreas appear normal. 3.  Small cyst in mid right kidney. 4.  Pleural effusions bilaterally. Electronically Signed   By: Lowella Grip III M.D.   On: 02/15/2017 15:43   Dg Chest Port 1 View  Result Date: 01/29/2017 CLINICAL DATA:  Increasing weakness after dialysis this afternoon. EXAM: PORTABLE CHEST 1 VIEW COMPARISON:  12/26/2016 FINDINGS: Interval placement of a right central venous catheter with tip over the cavoatrial junction region. No pneumothorax. Cardiac enlargement with pulmonary vascular congestion. Bilateral pleural effusions with basilar atelectasis. No significant change since previous study. IMPRESSION: Cardiac enlargement, pulmonary vascular congestion, bilateral pleural effusions, and basilar atelectasis similar to previous study. Electronically Signed   By: Lucienne Capers M.D.   On: 01/29/2017 01:09    Time Spent in minutes  25   Kathie Dike M.D on 02/18/2017 at 6:19 PM  Between 7am to 7pm - Pager [phone]  After 7pm go to [email or web address] - password [facility]  [facility] -  Office  [phone]

## 2017-02-18 NOTE — Progress Notes (Signed)
Patient declined nebulizer treatment at this time .The patient stated there was " too much going on right now" as she was receiving dialysis at the time and she also had visitors.  She has a PRN treatment available if needed.

## 2017-02-19 LAB — GLUCOSE, CAPILLARY
GLUCOSE-CAPILLARY: 16 mg/dL — AB (ref 65–99)
GLUCOSE-CAPILLARY: 133 mg/dL — AB (ref 65–99)
GLUCOSE-CAPILLARY: 269 mg/dL — AB (ref 65–99)
Glucose-Capillary: 52 mg/dL — ABNORMAL LOW (ref 65–99)
Glucose-Capillary: 35 mg/dL — CL (ref 65–99)
Glucose-Capillary: 46 mg/dL — ABNORMAL LOW (ref 65–99)
Glucose-Capillary: 62 mg/dL — ABNORMAL LOW (ref 65–99)
Glucose-Capillary: 111 mg/dL — ABNORMAL HIGH (ref 65–99)
Glucose-Capillary: 42 mg/dL — CL (ref 65–99)
Glucose-Capillary: 32 mg/dL — CL (ref 65–99)
Glucose-Capillary: 63 mg/dL — ABNORMAL LOW (ref 65–99)
Glucose-Capillary: 48 mg/dL — ABNORMAL LOW (ref 65–99)
Glucose-Capillary: 248 mg/dL — ABNORMAL HIGH (ref 65–99)

## 2017-02-19 MED ORDER — DEXTROSE 50 % IV SOLN
INTRAVENOUS | Status: AC
  Administered 2017-02-19: 50 mL
  Filled 2017-02-19: qty 50

## 2017-02-19 MED ORDER — SENNOSIDES-DOCUSATE SODIUM 8.6-50 MG PO TABS
2.0000 | ORAL_TABLET | Freq: Two times a day (BID) | ORAL | Status: DC
  Administered 2017-02-19 – 2017-02-22 (×3): 2 via ORAL
  Filled 2017-02-19 (×4): qty 2

## 2017-02-19 MED ORDER — DEXTROSE 10 % IV SOLN
INTRAVENOUS | Status: DC
  Administered 2017-02-19: 23:00:00 via INTRAVENOUS
  Filled 2017-02-19: qty 1000

## 2017-02-19 MED ORDER — GLUCOSE 40 % PO GEL
ORAL | Status: AC
  Filled 2017-02-19: qty 1

## 2017-02-19 NOTE — Progress Notes (Addendum)
Subjective: Interval History: Patient states that she is feeling much better.  She did not have any nausea or vomiting.  She denies also any difficulty in breathing.  Objective: Vital signs in last 24 hours: Temp:  [97.1 F (36.2 C)-97.7 F (36.5 C)] 97.6 F (36.4 C) (12/16 0641) Pulse Rate:  [65-74] 68 (12/16 0641) Resp:  [20] 20 (12/16 0641) BP: (99-118)/(46-57) 112/48 (12/16 0641) SpO2:  [94 %-100 %] 100 % (12/16 0641) Weight:  [91.6 kg (201 lb 15.1 oz)-93.4 kg (205 lb 14.6 oz)] 93.4 kg (205 lb 14.6 oz) (12/16 0641) Weight change: -0.072 kg (-2.5 oz)  Intake/Output from previous day: 12/15 0701 - 12/16 0700 In: 410 [P.O.:410] Out: 2000  Intake/Output this shift: No intake/output data recorded.  Generally patient is alert and in no apparent distress Chest: Clear to auscultation, no rales rhonchi or egophony Heart exam revealed regular rate and rhythm no murmur Extremities: No edema of lower extremities.  Her right hand swelling has improved.  She has left upper arm fistula which was placed on 12/30/2016.  Presently with bruit and thrill.  Lab Results: Recent Labs    02/17/17 0708 02/18/17 1237  WBC 5.7 5.7  HGB 9.6* 9.3*  HCT 32.1* 31.1*  PLT 107* 111*   BMET:  Recent Labs    02/17/17 0708 02/18/17 1237  NA 134* 128*  K 4.5 5.1  CL 95* 91*  CO2 27 24  GLUCOSE 74 193*  BUN 37* 63*  CREATININE 3.59* 4.39*  CALCIUM 8.7* 8.8*   No results for input(s): PTH in the last 72 hours. Iron Studies: No results for input(s): IRON, TIBC, TRANSFERRIN, FERRITIN in the last 72 hours.  Studies/Results: No results found.  I have reviewed the patient's current medications.  Assessment/Plan: 1] end-stage renal disease: She is s/p hemodialysis yesterday.  Patient presently is asymptomatic.  Potassium is normal. 2] anemia: Her hemoglobin is below our target goal,but stable. She is on Epogen  3] bone and mineral disorder: Her calcium and phosphorus is range. Not on binder 4]  hypertension: Her blood pressure is reasonably controlled 5] fluid management: No sign of fluid overload.  Patient also denies any difficulty breathing. 6] feeling tired and weak.  Patient is improving.  She is getting physical therapy and seems to be improving. 7] history of anxiety disorder: Feeling better but complains of feeling sleepy and drowsy 8] hypoglycemia: Patient is a symptomatic.  Presently to have difficulty in doing fingerstick. Plan: 1] patient does not require dialysis today. 2] her next dialysis will be on Tuesday. 3] we will check her CBC renal panel in the morning.    LOS: 9 days   [name] S 02/19/2017,9:27 AM

## 2017-02-19 NOTE — Progress Notes (Addendum)
Patient's blood sugar 42 carbs given, patient asymptomatic MD called.  Patient's blood sugar rechecked and was 32, patient remains asymptomatic and is eating dinner. MD informed and an amp of D50 given with an increase in blood sugar to 63. A repeat of Blood sugar at 1842 showed a sugar of 48, supervisor in room unable to get IV access. MD informed of blood sugar and that patient remains asymptomatic.  MD wants ICU nurse to try an IV with the ultrasound machine and if they are not able to obtain a site to call Vascular team.

## 2017-02-19 NOTE — Progress Notes (Signed)
Patient has a low blood sugar of 62 carbs given with a decrease in blood sugar of 35, given more carbs and a further decrease to16. Patient asymptomatic, MD informed and up to see patient, lab attempted to get a glucose on patient without success. Amp of D50 given per MD and a recheck of patient's blood sugar at 0939 resulted in a glucose of 133

## 2017-02-19 NOTE — Progress Notes (Signed)
Called by staff for persistent low blood sugars.  Patient is awake, alert, appears well.  She is eating a sandwich.  Blood sugars in the 30s-40s.  Unclear if this is accurate.  She does not appear to be having any symptoms.  She has lost IV access.  Will try to place peripheral IV with ultrasound.  Start on D10 infusion and monitor blood sugars.  Discontinue any further insulin.  Will check a.m. cortisol to evaluate for any adrenal insufficiency.  [employer]

## 2017-02-19 NOTE — Progress Notes (Signed)
PROGRESS NOTE                                                                                                                                                                                                             Patient Demographics:    [name], is a 71 y.o. female, DOB - November 22, 1945, VCB:[id number]  Admit date - 02/10/2017   Admitting Physician Louellen Molder, MD  Outpatient Primary MD for the patient is [name], MD  LOS - 9  Outpatient Specialists:Nephrology  Chief Complaint  Patient presents with  . Weakness       Brief Narrative   71 year old female with ischemic Cardiomyopathy, coronary artery disease with history of STEMI in 2017 stent placement, ESRD on dialysis (recently started, tu, th, sat  Schedule), Hypertension, hyperlipidemia, type 2 diabetes mellitus, A. Fib on anticoagulation presented with generalized weakness for past few weeks. She was hospitalized 2 weeks back for hypoglycemia secondary to poor by mouth intake and her insulin was stopped. Her INR also has been supratherapeutic for last several days and Her Coumadin was stopped about 10 days back When INR was checked during dialysis. Patient recently lost her husband and has been feeling low with poor appetite and generalized weakness. Her last dialysis session was also incomplete as patient reached late for her session.Despite stopping the Coumadin her INR Did not return to normal. She had her INR checked on 12/7 and was found to be >10. Patient was sent to the ED. Vitals in the ED was stable but had elevated troponin 1.68.Cardiology was consulted who recommended this was On likely due to ACS.Patient admitted for further management. She reports feeling weak and lightheaded. Denies any fever, chills, headache, blurred vision, nausea, vomiting, chest pain, palpitations, orthopnea, PND, abdominal pain, dysuria (Makes little urine) or diarrhea.  Denies any muscle or joint aches. Patient admitted for further management.   Subjective:   Patient noted to have severe episodes of hypoglycemia.  Required dextrose push.  Reliability of readings is questionable, since patient appears to be relatively asymptomatic despite noted to have a blood sugar of 16.  She is feeling overall better.  Assessment  & Plan :    Principal Problem:   Supratherapeutic INR No signs of bleeding. INR was greater than 10 despite patient being off Coumadin for almost 10 days. Received 2 units  FFP with dialysis and finally became therapeutic 3 days into hospitalization. She was on amiodarone and also had poor by mouth intake both can Keep INR elevated. No other drug interaction. Also had mild transaminitis on admission labs.  Patient received 5 mg Coumadin (after INR therapeutic) INR again jumped to 8.5. Ordered another 2 unit FFP.  Holding Coumadin for now. -Her CHADS2vasc score is 6 With high risk of stroke without anticoagulation. -Workup for coagulopathy as noted below.  Coagulopathy Patient's DIC panel does show elevated PTT, D-dimer and low platelets. Also has transaminitis.  Transient schistocytes noted on smears.  LDH also elevated.  Patient seen by hematology, appreciate input.  Felt that her lab abnormalities and coagulopathy may be related to liver disease as opposed to DIC.  It was recommended to continue observation of the patient's labs and consider giving vitamin K for elevated INR.  Coumadin was not felt to be a good choice in this patient and recommendations were to transition patient to Xarelto or Eliquis.  Discussed with cardiology, and Eliquis felt to be reasonable choice.  Since INR does not appear to be adequately trending down, she received a dose of vitamin K.  Repeat INR today is pending.  Once INR is below 2, we will start on Eliquis.  Elevated troponin Possibly associated with ESRD. No EKG changes or chest pain symptoms.Troponin peaked at  1.71. Cardiology consult appreciated. 2-D echo from 3 months back with EF of 35 and 35% and grade 2 diastolic dysfunction. Also showed mid inferior and basal mid inferior lateral hypokinesis. Continue current home medications.  ESRD on hemodialysis Nephrology following for dialysis needs.  Atrial fibrillation Rate controlled. Coumadin held for Supratherapeutic INR again.  Amiodarone currently on hold due to elevated LFTs, once INR trends down we will start on Eliquis.  Coronary artery disease Continue Plavix.  Statin on hold due to elevated LFTs  Type 2 diabetes mellitus Having episodes of severe hypoglycemia this morning, although I am not sure that readings are completely accurate.  Patient seems somewhat asymptomatic with this.  In any case, we will discontinue her Lantus.  Continue to monitor blood sugars  Ischemic Cardiomyopathy No signs of volume overload. Management with dialysis.    Sacral decubitus ulcer stage II, Leg ulcer Care per nursing, frequent repositioning in bed. Has left shin split , no bleeding or infection, clean eschar over left heel, small deep tissue injury over rt heel. Wound care consulted.  Right elbow and left knee pain. Suspected acute gouty arthritis +/- right elbow bursitis.  Treated with a course of prednisone and has improved.  She also received Lidoderm patch over right elbow.X-ray unremarkable.     Failure to thrive in adult Has poor by mouth intake. Nutrition consult appreciated and added supplement. PT recommend SNF.  Depression After losing her husband recently. Offered antidepressants but patient refused.  Elevated liver enzymes. Etiology is unclear.  Ultrasound imaging did not show any significant abnormalities in liver.  Holding amiodarone and statin.  Bilirubin is noted to be normal.  Overall enzymes are trending down    Code Status :Full code  Family Communication  :None at bedside  Disposition Plan  : discharge to SNF when  stable  Barriers For Discharge : Active symptoms  Consults  :  Nephrology, cardiology, hematology  Procedures  : None  DVT Prophylaxis  :  Supratherapeutic INR  Lab Results  Component Value Date   PLT 111 (L) 02/18/2017    Antibiotics  :  Anti-infectives (From admission, onward)   None        Objective:   Vitals:   02/18/17 1600 02/18/17 1635 02/18/17 2140 02/19/17 0641  BP: (!) 103/46 (!) 99/51 (!) 113/53 (!) 112/48  Pulse: 69 70 70 68  Resp:  20  20  Temp:  97.6 F (36.4 C) 97.7 F (36.5 C) 97.6 F (36.4 C)  TempSrc:  Axillary Oral Axillary  SpO2:   94% 100%  Weight:    93.4 kg (205 lb 14.6 oz)  Height:        Wt Readings from Last 3 Encounters:  02/19/17 93.4 kg (205 lb 14.6 oz)  02/02/17 91.9 kg (202 lb 9.6 oz)  01/17/17 83.3 kg (183 lb 10.3 oz)     Intake/Output Summary (Last 24 hours) at 02/19/2017 1417 Last data filed at 02/18/2017 2143 Gross per 24 hour  Intake 170 ml  Output 2000 ml  Net -1830 ml     Physical Exam Gen.: Elderly female not in distress, appears fatigued HEENT: Pallor present,Moist mucosa, supple neck Chest: Clear to auscultation bilaterally CVS: Normal S1 and S2, no murmurs GI: Soft, nondistended, nontender Musculoskeletal.  Arms are edematous bilaterally.  Due to edema, pulses are difficult to palpate.  Cap refill intact.  No cyanosis noted in fingertips.  Neurologically intact      Data Review:    CBC Recent Labs  Lab 02/14/17 0527 02/14/17 1333 02/16/17 0354 02/16/17 0830 02/17/17 0708 02/18/17 1237  WBC 6.4  --  5.3 5.4 5.7 5.7  HGB 10.4*  --  9.7* 9.6* 9.6* 9.3*  HCT 35.4*  --  32.8* 32.8* 32.1* 31.1*  PLT 123* 113* 98*  98* 105* 107* 111*  MCV 94.9  --  95.1 95.1 94.7 93.7  MCH 27.9  --  28.1 27.8 28.3 28.0  MCHC 29.4*  --  29.6* 29.3* 29.9* 29.9*  RDW 21.1*  --  21.2* 21.5* 21.4* 21.2*  LYMPHSABS  --   --  0.9  --   --   --   MONOABS  --   --  0.3  --   --   --   EOSABS  --   --  0.0  --   --    --   BASOSABS  --   --  0.0  --   --   --     Chemistries  Recent Labs  Lab 02/14/17 0416 02/15/17 0420 02/16/17 0354 02/17/17 0708 02/18/17 1237  NA 127* 132* 132* 134* 128*  K 5.5* 4.8 6.0* 4.5 5.1  CL 90* 94* 94* 95* 91*  CO2 23 24 24 27 24   GLUCOSE 105* 202* 139* 74 193*  BUN 63* 40* 55* 37* 63*  CREATININE 5.11* 3.72* 4.70* 3.59* 4.39*  CALCIUM 8.2* 8.6* 8.8* 8.7* 8.8*  AST 161* 145*  --  78* 45*  ALT 81* 78*  --  58* 47  ALKPHOS 239* 219*  --  213* 209*  BILITOT 0.8 0.8  --  0.7 0.8   ------------------------------------------------------------------------------------------------------------------ No results for input(s): CHOL, HDL, LDLCALC, TRIG, CHOLHDL, LDLDIRECT in the last 72 hours.  Lab Results  Component Value Date   HGBA1C 8.8 (H) 10/31/2016   ------------------------------------------------------------------------------------------------------------------ No results for input(s): TSH, T4TOTAL, T3FREE, THYROIDAB in the last 72 hours.  Invalid input(s): FREET3 ------------------------------------------------------------------------------------------------------------------ No results for input(s): VITAMINB12, FOLATE, FERRITIN, TIBC, IRON, RETICCTPCT in the last 72 hours.  Coagulation profile Recent Labs  Lab 02/14/17 1333 02/15/17 0420 02/16/17 0354 02/17/17 0708 02/18/17 1237  INR REPEATED TO VERIFY 4.56* 4.95* 3.70 3.74    No results for input(s): DDIMER in the last 72 hours.  Cardiac Enzymes No results for input(s): CKMB, TROPONINI, MYOGLOBIN in the last 168 hours.  Invalid input(s): CK ------------------------------------------------------------------------------------------------------------------    Component Value Date/Time   BNP 2,761.4 (H) 12/23/2016 1855    Inpatient Medications  Scheduled Meds: . dextrose      . ALPRAZolam  0.5 mg Oral QHS  . calcitRIOL  0.5 mcg Oral Daily  . clopidogrel  75 mg Oral Daily  . epoetin  (EPOGEN/PROCRIT) injection  4,000 Units Intravenous Q T,Th,Sa-HD  . feeding supplement (NEPRO CARB STEADY)  237 mL Oral BID BM  . feeding supplement (PRO-STAT SUGAR FREE 64)  30 mL Oral BID  . ferrous sulfate  325 mg Oral Q breakfast  . fluticasone  1 spray Each Nare Daily  . insulin aspart  0-15 Units Subcutaneous TID WC  . isosorbide mononitrate  30 mg Oral BID  . latanoprost  1 drop Both Eyes QHS  . levalbuterol  0.63 mg Nebulization TID  . lidocaine  1 patch Transdermal Q24H  . loratadine  10 mg Oral Daily  . LORazepam  1 mg Intravenous Once  . meclizine  12.5 mg Oral BID  . mouth rinse  15 mL Mouth Rinse BID  . milk and molasses  1 enema Rectal Once  . pantoprazole  40 mg Oral Daily  . polyethylene glycol  17 g Oral Daily  . sodium chloride flush  3 mL Intravenous Q12H  . vitamin A & D   Topical QID   Continuous Infusions:  PRN Meds:.acetaminophen **OR** acetaminophen, albuterol, cyclobenzaprine, heparin, heparin, ondansetron **OR** ondansetron (ZOFRAN) IV, senna-docusate  Micro Results No results found for this or any previous visit (from the past 240 hour(s)).  Radiology Reports Dg Chest 2 View  Result Date: 02/10/2017 CLINICAL DATA:  Cough and fatigue EXAM: CHEST  2 VIEW COMPARISON:  January 29, 2017 FINDINGS: There remains airspace consolidation in the right base with small pleural effusions bilaterally. There is cardiomegaly with pulmonary vascularity within normal limits. No adenopathy. Central catheter tip is at the cavoatrial junction. No pneumothorax. No evident bone lesions. IMPRESSION: Stable cardiac prominence with bilateral pleural effusions. Suspect superimposed pneumonia right base. Lungs elsewhere clear. Pulmonary vascularity appears within normal limits. No pneumothorax. Central catheter position unchanged. Electronically Signed   By: Lowella Grip III M.D.   On: 02/10/2017 13:58   Dg Elbow 2 Views Right  Result Date: 02/12/2017 CLINICAL DATA:  71 year old  female with right elbow pain. EXAM: RIGHT ELBOW - 2 VIEW COMPARISON:  03/04/2004 FINDINGS: Please note, this is a limited evaluation due to suboptimal positioning. There is no evidence of fracture, dislocation, or joint effusion. There is no evidence of arthropathy or other focal bone abnormality. Soft tissues are unremarkable. IMPRESSION: Limited study due to suboptimal positioning. No gross osseous or soft tissue abnormalities. Electronically Signed   By: Kristopher Oppenheim M.D.   On: 02/12/2017 09:32   Dg Knee 1-2 Views Left  Result Date: 02/12/2017 CLINICAL DATA:  71 year old female with left knee pain. EXAM: LEFT KNEE - 1-2 VIEW COMPARISON:  None. FINDINGS: No evidence of fracture, dislocation, or joint effusion. No focal bone abnormality. There are mild degenerative changes with chondrocalcinosis noted. Soft tissues are unremarkable. IMPRESSION: No acute osseous abnormality. Electronically Signed   By: Kristopher Oppenheim M.D.   On: 02/12/2017 09:31   US Abdomen Complete  Result Date: 02/15/2017 CLINICAL DATA:  Transaminitis  EXAM: ABDOMEN ULTRASOUND COMPLETE COMPARISON:  CT abdomen and pelvis March 20, 2016 FINDINGS: Gallbladder: Within the gallbladder, there are multiple echogenic foci which move and shadow consistent with cholelithiasis. Largest gallstone measures 7 mm in length. In addition, there is a 3 mm echogenic focus which neither moves nor shadows, a presumed small polyp. Gallbladder wall is upper normal in thickness without edema or pericholecystic fluid. No sonographic Murphy sign noted by sonographer. Common bile duct: Diameter: 4 mm. No intrahepatic, common hepatic, or common bile duct dilatation. Liver: No focal lesion identified. Within normal limits in parenchymal echogenicity. Portal vein is patent on color Doppler imaging with normal direction of blood flow towards the liver. IVC: No abnormality visualized. Pancreas: Visualized portion unremarkable. Portions of the pancreas are obscured by  gas. Spleen: Size and appearance within normal limits. Right Kidney: Length: 10.9 cm. Echogenicity within normal limits. No hydronephrosis visualized. There is a cyst in the mid right kidney measuring 1.2 x 0.8 x 0.9 cm Left Kidney: Length: 11.1 cm. Echogenicity within normal limits. No mass or hydronephrosis visualized. Abdominal aorta: No aneurysm visualized. Other findings: No demonstrable ascites. There are pleural effusions bilaterally. IMPRESSION: 1. Cholelithiasis. There is also a 3 mm polyp within the gallbladder. 2. Portions of pancreas obscured by gas. Visualized portions of pancreas appear normal. 3.  Small cyst in mid right kidney. 4.  Pleural effusions bilaterally. Electronically Signed   By: Lowella Grip III M.D.   On: 02/15/2017 15:43   Dg Chest Port 1 View  Result Date: 01/29/2017 CLINICAL DATA:  Increasing weakness after dialysis this afternoon. EXAM: PORTABLE CHEST 1 VIEW COMPARISON:  12/26/2016 FINDINGS: Interval placement of a right central venous catheter with tip over the cavoatrial junction region. No pneumothorax. Cardiac enlargement with pulmonary vascular congestion. Bilateral pleural effusions with basilar atelectasis. No significant change since previous study. IMPRESSION: Cardiac enlargement, pulmonary vascular congestion, bilateral pleural effusions, and basilar atelectasis similar to previous study. Electronically Signed   By: Lucienne Capers M.D.   On: 01/29/2017 01:09    Time Spent in minutes  25   Kathie Dike M.D on 02/19/2017 at 2:17 PM  Between 7am to 7pm - Pager [phone]  After 7pm go to [email or web address] - password [facility]  [facility] -  Office  [phone]

## 2017-02-19 NOTE — Progress Notes (Signed)
At approximately 1650 I attempted (x2) to obtain blood for lab orders and was unable to obtain a specimen. RN was aware.

## 2017-02-20 ENCOUNTER — Inpatient Hospital Stay (HOSPITAL_COMMUNITY): Payer: BLUE CROSS/BLUE SHIELD

## 2017-02-20 LAB — PHOSPHORUS: Phosphorus: 5.1 mg/dL — ABNORMAL HIGH (ref 2.5–4.6)

## 2017-02-20 LAB — GLUCOSE, CAPILLARY
GLUCOSE-CAPILLARY: 206 mg/dL — AB (ref 65–99)
GLUCOSE-CAPILLARY: 286 mg/dL — AB (ref 65–99)
GLUCOSE-CAPILLARY: 291 mg/dL — AB (ref 65–99)
Glucose-Capillary: 334 mg/dL — ABNORMAL HIGH (ref 65–99)
Glucose-Capillary: 284 mg/dL — ABNORMAL HIGH (ref 65–99)
Glucose-Capillary: 338 mg/dL — ABNORMAL HIGH (ref 65–99)

## 2017-02-20 LAB — COMPREHENSIVE METABOLIC PANEL
ALT: 33 U/L (ref 14–54)
ANION GAP: 10 (ref 5–15)
AST: 26 U/L (ref 15–41)
Albumin: 2.1 g/dL — ABNORMAL LOW (ref 3.5–5.0)
Alkaline Phosphatase: 206 U/L — ABNORMAL HIGH (ref 38–126)
BUN: 55 mg/dL — ABNORMAL HIGH (ref 6–20)
CHLORIDE: 94 mmol/L — AB (ref 101–111)
CO2: 26 mmol/L (ref 22–32)
CREATININE: 3.78 mg/dL — AB (ref 0.44–1.00)
Calcium: 8.5 mg/dL — ABNORMAL LOW (ref 8.9–10.3)
GFR, EST AFRICAN AMERICAN: 13 mL/min — AB (ref >60)
GFR, EST NON AFRICAN AMERICAN: 11 mL/min — AB (ref >60)
Glucose, Bld: 351 mg/dL — ABNORMAL HIGH (ref 65–99)
POTASSIUM: 4.5 mmol/L (ref 3.5–5.1)
Sodium: 130 mmol/L — ABNORMAL LOW (ref 135–145)
Total Bilirubin: 0.6 mg/dL (ref 0.3–1.2)
Total Protein: 6.3 g/dL — ABNORMAL LOW (ref 6.5–8.1)

## 2017-02-20 LAB — BLOOD GAS, ARTERIAL
Acid-Base Excess: 0.5 mmol/L (ref 0.0–2.0)
Bicarbonate: 24.6 mmol/L (ref 20.0–28.0)
DRAWN BY: 21310
O2 Content: 3 L/min
O2 Saturation: 98.3 %
PATIENT TEMPERATURE: 37
pCO2 arterial: 48.5 mmHg — ABNORMAL HIGH (ref 32.0–48.0)
pH, Arterial: 7.342 — ABNORMAL LOW (ref 7.350–7.450)
pO2, Arterial: 177 mmHg — ABNORMAL HIGH (ref 83.0–108.0)

## 2017-02-20 LAB — PROTIME-INR
INR: 1.84
PROTHROMBIN TIME: 21.1 s — AB (ref 11.4–15.2)

## 2017-02-20 LAB — CBC
HEMATOCRIT: 30.8 % — AB (ref 36.0–46.0)
Hemoglobin: 8.9 g/dL — ABNORMAL LOW (ref 12.0–15.0)
MCH: 27.6 pg (ref 26.0–34.0)
MCHC: 28.9 g/dL — ABNORMAL LOW (ref 30.0–36.0)
MCV: 95.4 fL (ref 78.0–100.0)
Platelets: 96 10*3/uL — ABNORMAL LOW (ref 150–400)
RBC: 3.23 MIL/uL — ABNORMAL LOW (ref 3.87–5.11)
RDW: 21.3 % — AB (ref 11.5–15.5)
WBC: 5.2 10*3/uL (ref 4.0–10.5)

## 2017-02-20 LAB — CORTISOL-AM, BLOOD: Cortisol - AM: 21.7 ug/dL (ref 6.7–22.6)

## 2017-02-20 MED ORDER — LIDOCAINE HCL (PF) 1 % IJ SOLN: 5.0000 mL | INTRAMUSCULAR | Status: DC | PRN

## 2017-02-20 MED ORDER — ALTEPLASE 2 MG IJ SOLR
2.0000 mg | Freq: Once | INTRAMUSCULAR | Status: DC | PRN
  Filled 2017-02-20: qty 2

## 2017-02-20 MED ORDER — SODIUM CHLORIDE 0.9 % IV SOLN: 100.0000 mL | INTRAVENOUS | Status: DC | PRN

## 2017-02-20 MED ORDER — INSULIN ASPART 100 UNIT/ML ~~LOC~~ SOLN
0.0000 [IU] | Freq: Three times a day (TID) | SUBCUTANEOUS | Status: DC
  Administered 2017-02-21 – 2017-02-22 (×2): 3 [IU] via SUBCUTANEOUS
  Administered 2017-02-22: 5 [IU] via SUBCUTANEOUS

## 2017-02-20 MED ORDER — DEXTROSE 10 % IV SOLN: INTRAVENOUS | Status: DC

## 2017-02-20 MED ORDER — LIDOCAINE-PRILOCAINE 2.5-2.5 % EX CREA: 1.0000 "application " | TOPICAL_CREAM | CUTANEOUS | Status: DC | PRN

## 2017-02-20 MED ORDER — APIXABAN 5 MG PO TABS
5.0000 mg | ORAL_TABLET | Freq: Two times a day (BID) | ORAL | Status: DC
  Administered 2017-02-20: 5 mg via ORAL
  Filled 2017-02-20: qty 1

## 2017-02-20 MED ORDER — PENTAFLUOROPROP-TETRAFLUOROETH EX AERO: 1.0000 "application " | INHALATION_SPRAY | CUTANEOUS | Status: DC | PRN

## 2017-02-20 NOTE — Progress Notes (Signed)
PT Cancellation Note  Patient Details Name: [name] MRN: [id number] DOB: 1945-11-25   Cancelled Treatment:    Reason Eval/Treat Not Completed: Patient at procedure or test/unavailable.  Patient to receive an enema and declined therapy when offered to have physical therapy first, then have the enema.  Patient requested to have enema first due to c/o pain.   3:44 PM, 02/20/17 Lonell Grandchild, MPT Physical Therapist with [facility] 336 [phone] office [phone] mobile phone

## 2017-02-20 NOTE — Progress Notes (Addendum)
ANTICOAGULATION CONSULT NOTE - Initial Consult  Pharmacy Consult for apixaban Indication: atrial fibrillation  Allergies  Allergen Reactions  . Ace Inhibitors Cough  . Amlodipine Swelling    UNSPECIFIED EDEMA   . Codeine Rash    Patient Measurements: Height: 5\' 8"  (172.7 cm) Weight: 207 lb 7.3 oz (94.1 kg) IBW/kg (Calculated) : 63.9  Vital Signs: Temp: 94.8 F (34.9 C) (12/17 0505) Temp Source: Oral (12/17 0505) BP: 102/46 (12/17 0505) Pulse Rate: 68 (12/17 0505)  Labs: Recent Labs    02/18/17 1237 02/20/17 0854  HGB 9.3* 8.9*  HCT 31.1* 30.8*  PLT 111* 96*  LABPROT 36.7* 21.1*  INR 3.74 1.84  CREATININE 4.39* 3.78*    Estimated Creatinine Clearance: 16.1 mL/min (A) (by C-G formula based on SCr of 3.78 mg/dL (H)).   Medical History: Past Medical History:  Diagnosis Date  . Acute renal failure superimposed on stage 4 chronic kidney disease ([place]) 07/04/2015  . Allergic rhinitis   . Anemia   . Anxiety   . CAD in native artery    NSTEMI 07/2015 s/p DES to RCA and posterior PDA, PCI 10/2015 with scoring balloon to 85% ISR of distal RCA), known LAD/Cx disease treated medically  . Carotid artery disease ([place])    Mild bilateral carotid disease (1-39% 07/2015)  . Chronic combined systolic and diastolic CHF (congestive heart failure) ([place])   . CKD (chronic kidney disease), stage IV ([place])   . Constipation   . Essential hypertension   . GERD (gastroesophageal reflux disease)   . History of hysterectomy   . Hyperlipidemia   . Low back pain   . Obesity   . 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 Osteoarthritis   . Overactive bladder   . PVC's (premature ventricular contractions)   . Type 2 diabetes mellitus (HCC)     Medications:  Medications Prior to Admission  Medication Sig Dispense Refill Last Dose  . Insulin Glargine (LANTUS) 100 UNIT/ML Solostar Pen Inject 30 Units into the skin  daily at 10 pm.     . insulin lispro (HUMALOG) 100 UNIT/ML KiwkPen Inject 5-11 Units into the skin 3 (three) times daily before meals.     Marland Kitchen albuterol (PROVENTIL HFA;VENTOLIN HFA) 108 (90 Base) MCG/ACT inhaler Inhale 1-2 puffs into the lungs every 6 (six) hours as needed for wheezing or shortness of breath. 1 Inhaler 0 unknnown at prn  . albuterol (PROVENTIL) (2.5 MG/3ML) 0.083% nebulizer solution Take 3 mLs (2.5 mg total) by nebulization every 6 (six) hours as needed for wheezing or shortness of breath. 75 mL 1 unknown at prn  . ALPRAZolam (XANAX) 0.5 MG tablet Take 0.5 mg by mouth at bedtime.   12/22/2016  . amiodarone (PACERONE) 200 MG tablet Take 1 tablet (200 mg total) by mouth daily. 30 tablet 0   . calcitRIOL (ROCALTROL) 0.5 MCG capsule Take 1 capsule (0.5 mcg total) daily by mouth. 30 capsule 0   . cetirizine (ZYRTEC) 10 MG tablet Take 5 mg by mouth daily.  3 12/23/2016  . clopidogrel (PLAVIX) 75 MG tablet Take 1 tablet (75 mg total) by mouth daily. 30 tablet 0 12/23/2016  . cyclobenzaprine (FLEXERIL) 5 MG tablet Take 5 mg by mouth as needed (lower back pain). osteoporosis with out current pathological fracture for 14 days  0 unknown at prn  . ferrous sulfate 325 (65 FE) MG tablet Take 325 mg by mouth daily with breakfast.  12/23/2016  . fluticasone (FLONASE) 50 MCG/ACT nasal spray Place 1 spray into both nostrils daily.   12/23/2016  . isosorbide mononitrate (IMDUR) 30 MG 24 hr tablet Take 1 tablet (30 mg total) 2 (two) times daily by mouth. 60 tablet 0   . latanoprost (XALATAN) 0.005 % ophthalmic solution Place 1 drop into both eyes at bedtime.    12/22/2016  . meclizine (ANTIVERT) 25 MG tablet Take 12.5 mg by mouth 2 (two) times daily.    12/23/2016  . nitroGLYCERIN (NITROSTAT) 0.4 MG SL tablet Place 1 tablet (0.4 mg total) under the tongue every 5 (five) minutes as needed for chest pain. 25 tablet 2 unknown at prn  . pantoprazole (PROTONIX) 40 MG tablet Take 40 mg by mouth daily.     12/23/2016  . rosuvastatin (CRESTOR) 40 MG tablet Take 1 tablet (40 mg total) by mouth at bedtime. 30 tablet 0 12/22/2016    Assessment: 71 yo female with chronic Afib and coumadin. She had been on coumadin and has had difficulty with management. On admission she had an elevated an elevated INR of 7.39, No signs of bleeding,despite being off Coumadin for 10 days  and given Vitamin K. She did hav 2 units of FFP with dialysis and finally became therapeutic but once Coumadin restarted, she has continued to have difficulty with dosing and an elevated INR. In addition, her DIC panel abnormal. Hematology consulted 12/13 and felt that her lab abnormalities and coagulopathy may be related to liver disease as opposed to DIC. In addiition , Dr. Talbert Cage recommended xarelto or eliquis for afib. Coumadin was not felt to be a good choice in this patient..  Discussed with cardiology, and Eliquis felt to be reasonable choice.  Her INR is now <2 and starting eliquis  Goal of Therapy:   Monitor platelets by anticoagulation protocol: Yes   Plan:  Apixaban 5mg  po bid Monitor for S/S of bleeding Apixaban education  Isac Sarna, BS Pharm D, BCPS Clinical Pharmacist Pager [phone] 02/20/2017,5:01 PM   Addum:  Change eliquis dose to 2.5 mg bid for renal impairment. Excell Seltzer, PharmD

## 2017-02-20 NOTE — Progress Notes (Signed)
Inpatient Diabetes Program Recommendations  AACE/ADA: New Consensus Statement on Inpatient Glycemic Control (2015)  Target Ranges:  Prepandial:   less than 140 mg/dL      Peak postprandial:   less than 180 mg/dL (1-2 hours)      Critically ill patients:  140 - 180 mg/dL   Results for [name], [name] (MRN [id number]) as of 02/20/2017 10:30  Ref. Range 02/20/2017 00:25 02/20/2017 04:02 02/20/2017 08:18  Glucose-Capillary Latest Ref Range: 65 - 99 mg/dL 291 (H) 286 (H) 334 (H)  Results for [name], [name] (MRN [id number]) as of 02/20/2017 10:30  Ref. Range 02/19/2017 07:16 02/19/2017 07:37 02/19/2017 07:56 02/19/2017 08:03 02/19/2017 08:15 02/19/2017 09:39 02/19/2017 11:40 02/19/2017 16:46 02/19/2017 18:12 02/19/2017 18:26 02/19/2017 18:42 02/19/2017 21:34 02/19/2017 21:55  Glucose-Capillary Latest Ref Range: 65 - 99 mg/dL 52 (L) 62 (L) 35 (LL) 16 (LL) 46 (L) 133 (H) 111 (H) 42 (LL) 32 (LL) 63 (L) 48 (L) 269 (H) 248 (H)   Review of Glycemic Control  Current orders for Inpatient glycemic control: Novolog 0-9 units TID with meals  Inpatient Diabetes Program Recommendations:  Correction (SSI): Noted Novolog 0-9 units TID was re-ordered this morning. If patient continues to have glycemic varibility, may want to consider consulting Dr. Dorris Fetch for assistance with glycemic control. Patient is followed by Dr. Dorris Fetch as an outpatient.  Thanks, Barnie Alderman, RN, MSN, CDE Diabetes Coordinator Inpatient Diabetes Program [phone] (Team Pager from 8am to 5pm)

## 2017-02-20 NOTE — Progress Notes (Signed)
PROGRESS NOTE    [name]  WUJ:[id number] DOB: 1945/10/13 DOA: 02/10/2017 PCP: [name], MD    Brief Narrative:  71 year old female with ischemic Cardiomyopathy, coronary artery disease with history of STEMI in 2017 stent placement, ESRD on dialysis (recently started, tu, th, sat  Schedule), Hypertension, hyperlipidemia, type 2 diabetes mellitus, A. Fib on anticoagulation presented with generalized weakness for past few weeks. She was hospitalized 2 weeks back for hypoglycemia secondary to poor by mouth intake and her insulin was stopped. Her INR also has been supratherapeutic for last several days and Her Coumadin was stopped about 10 days back When INR was checked during dialysis. Patient recently lost her husband and has been feeling low with poor appetite and generalized weakness. Her last dialysis session was also incomplete as patient reached late for her session.Despite stopping the Coumadin her INR Did not return to normal. She had her INR checked on 12/7 and was found to be >10. Patient was sent to the ED. Vitals in the ED was stable but had elevated troponin 1.68.Cardiology was consulted who recommended this was On likely due to ACS.Patient admitted for further management. She reports feeling weak and lightheaded. Denies any fever, chills, headache, blurred vision, nausea, vomiting, chest pain, palpitations, orthopnea, PND, abdominal pain, dysuria (Makes little urine) or diarrhea. Denies any muscle or joint aches. Patient admitted for further management.     Assessment & Plan:   Principal Problem:   Supratherapeutic INR Active Problems:   Essential hypertension   Coronary artery disease   GERD   Type 2 diabetes mellitus with stage 4 chronic kidney disease, with long-term current use of insulin (HCC)   Elevated troponin   Hyperglycemia   PAF (paroxysmal atrial fibrillation) (HCC)   Ischemic cardiomyopathy   ESRD on hemodialysis ([place])   Sacral decubitus ulcer  Failure to thrive in adult   1. Supratherapeutic INR.  Patient was initially admitted to the hospital with supratherapeutic INR which was felt to be related to Coumadin.  The patient had not been on any Coumadin for almost 10 days and her INR remained supratherapeutic.  She received 2 units of FFP which resulted in improvement of INR.  Since her coagulopathy persisted, further workup including PTT, d-dimer, LDH were checked.  These labs were noted to be abnormal raising concern for DIC.  She was seen by hematology who did not feel that the patient had DIC.  It was felt that her coagulopathy may be related to Coumadin use in this dialysis patient as well as some element.  The patient received vitamin K which promptly improved her INR.  It was felt that she would be a better candidate for Eliquis.  Now that her INR is under 2, she has been started on Eliquis.  Continue to monitor. 2. Elevated troponin.  Patient was found to have elevated troponin without any chest pain or EKG changes.  Troponin peaked at 1.7.  She was seen by cardiology.  Echocardiogram was performed that showed ejection fraction of 35% with grade 2 diastolic dysfunction.  No further workup is recommended at this time. 3. End-stage renal disease on hemodialysis.  Nephrology has been following for dialysis needs. 4. Atrial fibrillation.  Chads vas score of 6.  She is currently rate controlled.  She was previously on amiodarone, but this was held due to elevated LFTs.  LFTs have since improved.  Heart rate remained stable.  Continue anticoagulation with Eliquis. 5. Coronary artery disease.  On Plavix.  Statin on hold  due to elevated LFTs. 6. Diabetes.  Patient is chronically on Lantus.  She has been having episodes of severe hypoglycemia during her hospital stay.  It is unclear whether these are completely accurate since she does not appear to be very symptomatic.  What is concerning, is that her blood sugars do improve after administering  dextrose.  Her Lantus has not been discontinued.  She was briefly started on D10 infusion which is also been discontinued.  Blood sugars are trending in the 300s now.  She has been started back on sliding scale insulin.  We will continue to monitor blood sugars and see if Lantus can be safely restarted.  Her fingertips are very cold and blood is often difficult to obtain for fingersticks.  This may be affecting results of fingerstick glucose.  It is also been difficult to confirm glucose by blood draw since she has significant edema in her arms and veins are often difficult to find. 7. Right elbow and left knee pain.  Suspected to be related related to acute gouty arthritis plus\minus right elbow bursitis.  Patient was treated with a course of prednisone and has since improved.  She also received Lidoderm patch over her right elbow.  X-rays have been unremarkable. 8. Sacral decubitus ulcer stage II, leg ulcer.  Seen by wound care.  Continue management per their recommendations. 9. Ischemic cardiomyopathy.  Continues to have signs of volume overload.  Management with dialysis. 10. Acute respiratory failure with hypoxia.  Oxygen requirement appears to be trending up over the last several days.  She does have significant peripheral edema.  Will recheck chest x-ray. 11. Await liver enzymes.  Etiology is unclear.  Ultrasound imaging did not indicate any significant abnormalities in liver.  Amiodarone and statin have since been held.  Transaminases have normalized.  Bilirubin has stayed normal. 12. Failure to thrive.  Initially patient was noted to have poor p.o. intake.  Seen by nutrition and nutritional supplements added. 13. Depression.  Patient's husband recently passed away.  She was offered antidepressants, but refused.  Appears to be stable at this time.   DVT prophylaxis: Eliquis Code Status: Full code Family Communication: No family present Disposition Plan: Discharge to skilled nursing facility when  stable   Consultants:   Cardiology  Nephrology  Hematology  Procedures:    Antimicrobials:       Subjective: Feeling better today.  Denies any shortness of breath.  No chest pain.  Objective: Vitals:   02/19/17 1500 02/19/17 2225 02/19/17 2300 02/20/17 0505  BP: (!) 105/49 (!) 101/50  (!) 102/46  Pulse: 68 73  68  Resp: 20 20  20   Temp: 97.9 F (36.6 C) 98.3 F (36.8 C)  (!) 94.8 F (34.9 C)  TempSrc: Oral Oral  Oral  SpO2: 98%  (!) 81% 91%  Weight:    94.1 kg (207 lb 7.3 oz)  Height:        Intake/Output Summary (Last 24 hours) at 02/20/2017 1647 Last data filed at 02/20/2017 0300 Gross per 24 hour  Intake 207.33 ml  Output -  Net 207.33 ml   Filed Weights   02/18/17 1235 02/19/17 0641 02/20/17 0505  Weight: 91.6 kg (201 lb 15.1 oz) 93.4 kg (205 lb 14.6 oz) 94.1 kg (207 lb 7.3 oz)    Examination:  General exam: Appears calm and comfortable  Respiratory system: Diminished breath sounds at bases. Respiratory effort normal. Cardiovascular system: S1 & S2 heard, RRR. No JVD, murmurs, rubs, gallops or clicks. 1-2+  pedal edema. Gastrointestinal system: Abdomen is nondistended, soft and nontender. No organomegaly or masses felt. Normal bowel sounds heard. Central nervous system: Alert and oriented. No focal neurological deficits. Extremities: Symmetric 5 x 5 power. Skin: No rashes, lesions or ulcers Psychiatry: Judgement and insight appear normal. Mood & affect appropriate.     Data Reviewed: I have personally reviewed following labs and imaging studies  CBC: Recent Labs  Lab 02/16/17 0354 02/16/17 0830 02/17/17 0708 02/18/17 1237 02/20/17 0854  WBC 5.3 5.4 5.7 5.7 5.2  NEUTROABS 4.1  --   --   --   --   HGB 9.7* 9.6* 9.6* 9.3* 8.9*  HCT 32.8* 32.8* 32.1* 31.1* 30.8*  MCV 95.1 95.1 94.7 93.7 95.4  PLT 98*  98* 105* 107* 111* 96*   Basic Metabolic Panel: Recent Labs  Lab 02/14/17 0416 02/15/17 0420 02/16/17 0354 02/16/17 0830  02/17/17 0708 02/18/17 1237 02/20/17 0854  NA 127* 132* 132*  --  134* 128* 130*  K 5.5* 4.8 6.0*  --  4.5 5.1 4.5  CL 90* 94* 94*  --  95* 91* 94*  CO2 23 24 24   --  27 24 26   GLUCOSE 105* 202* 139*  --  74 193* 351*  BUN 63* 40* 55*  --  37* 63* 55*  CREATININE 5.11* 3.72* 4.70*  --  3.59* 4.39* 3.78*  CALCIUM 8.2* 8.6* 8.8*  --  8.7* 8.8* 8.5*  PHOS 5.2*  --   --  6.1* 4.3  --  5.1*   GFR: Estimated Creatinine Clearance: 16.1 mL/min (A) (by C-G formula based on SCr of 3.78 mg/dL (H)). Liver Function Tests: Recent Labs  Lab 02/14/17 0416 02/15/17 0420 02/16/17 0830 02/17/17 0708 02/18/17 1237 02/20/17 0854  AST 161* 145*  --  78* 45* 26  ALT 81* 78*  --  58* 47 33  ALKPHOS 239* 219*  --  213* 209* 206*  BILITOT 0.8 0.8  --  0.7 0.8 0.6  PROT 6.6 7.0  --  7.0 6.8 6.3*  ALBUMIN 2.4*  2.4* 2.6* 2.6* 2.4*  2.4* 2.3* 2.1*   No results for input(s): LIPASE, AMYLASE in the last 168 hours. No results for input(s): AMMONIA in the last 168 hours. Coagulation Profile: Recent Labs  Lab 02/15/17 0420 02/16/17 0354 02/17/17 0708 02/18/17 1237 02/20/17 0854  INR 4.56* 4.95* 3.70 3.74 1.84   Cardiac Enzymes: No results for input(s): CKTOTAL, CKMB, CKMBINDEX, TROPONINI in the last 168 hours. BNP (last 3 results) No results for input(s): PROBNP in the last 8760 hours. HbA1C: No results for input(s): HGBA1C in the last 72 hours. CBG: Recent Labs  Lab 02/20/17 0025 02/20/17 0402 02/20/17 0818 02/20/17 1223 02/20/17 1542  GLUCAP 291* 286* 334* 284* 338*   Lipid Profile: No results for input(s): CHOL, HDL, LDLCALC, TRIG, CHOLHDL, LDLDIRECT in the last 72 hours. Thyroid Function Tests: No results for input(s): TSH, T4TOTAL, FREET4, T3FREE, THYROIDAB in the last 72 hours. Anemia Panel: No results for input(s): VITAMINB12, FOLATE, FERRITIN, TIBC, IRON, RETICCTPCT in the last 72 hours. Sepsis Labs: No results for input(s): PROCALCITON, LATICACIDVEN in the last 168  hours.  No results found for this or any previous visit (from the past 240 hour(s)).       Radiology Studies: No results found.      Scheduled Meds: . ALPRAZolam  0.5 mg Oral QHS  . calcitRIOL  0.5 mcg Oral Daily  . clopidogrel  75 mg Oral Daily  . epoetin (EPOGEN/PROCRIT) injection  4,000 Units Intravenous Q T,Th,Sa-HD  . feeding supplement (NEPRO CARB STEADY)  237 mL Oral BID BM  . feeding supplement (PRO-STAT SUGAR FREE 64)  30 mL Oral BID  . ferrous sulfate  325 mg Oral Q breakfast  . fluticasone  1 spray Each Nare Daily  . insulin aspart  0-9 Units Subcutaneous TID WC  . isosorbide mononitrate  30 mg Oral BID  . latanoprost  1 drop Both Eyes QHS  . levalbuterol  0.63 mg Nebulization TID  . lidocaine  1 patch Transdermal Q24H  . loratadine  10 mg Oral Daily  . LORazepam  1 mg Intravenous Once  . meclizine  12.5 mg Oral BID  . mouth rinse  15 mL Mouth Rinse BID  . milk and molasses  1 enema Rectal Once  . pantoprazole  40 mg Oral Daily  . polyethylene glycol  17 g Oral Daily  . senna-docusate  2 tablet Oral BID  . sodium chloride flush  3 mL Intravenous Q12H  . vitamin A & D   Topical QID   Continuous Infusions: . sodium chloride    . sodium chloride       LOS: 10 days    Time spent: 72mins    Kathie Dike, MD [facility] Pager [phone]  If 7PM-7AM, please contact night-coverage [email or web address] Password [facility] 02/20/2017, 4:47 PM

## 2017-02-20 NOTE — Progress Notes (Signed)
Subjective: Interval History: Except feeling weak patient states that she is feeling better.  At this moment she denies also any difficulty breathing.  Her appetite is improving  Objective: Vital signs in last 24 hours: Temp:  [94.8 F (34.9 C)-98.3 F (36.8 C)] 94.8 F (34.9 C) (12/17 0505) Pulse Rate:  [68-73] 68 (12/17 0505) Resp:  [20] 20 (12/17 0505) BP: (101-105)/(46-50) 102/46 (12/17 0505) SpO2:  [81 %-98 %] 91 % (12/17 0505) Weight:  [94.1 kg (207 lb 7.3 oz)] 94.1 kg (207 lb 7.3 oz) (12/17 0505) Weight change: 2.5 kg (5 lb 8.2 oz)  Intake/Output from previous day: 12/16 0701 - 12/17 0700 In: 567.3 [P.O.:480; I.V.:87.3] Out: 1 [Urine:1] Intake/Output this shift: No intake/output data recorded.  Generally patient is alert and in no apparent distress Chest: Clear to auscultation, no rales rhonchi or egophony Heart exam revealed regular rate and rhythm no murmur Extremities: No edema of lower extremities..  Her right swelling has improved.   Lab Results: Recent Labs    02/18/17 1237  WBC 5.7  HGB 9.3*  HCT 31.1*  PLT 111*   BMET:  Recent Labs    02/18/17 1237  NA 128*  K 5.1  CL 91*  CO2 24  GLUCOSE 193*  BUN 63*  CREATININE 4.39*  CALCIUM 8.8*   No results for input(s): PTH in the last 72 hours. Iron Studies: No results for input(s): IRON, TIBC, TRANSFERRIN, FERRITIN in the last 72 hours.  Studies/Results: No results found.  I have reviewed the patient's current medications.  Assessment/Plan: 1] end-stage renal disease: She is s/p hemodialysis on Saturday.  Patient at this moment does not have any uremic signs and symptoms.  Unable to get blood from patient to check her lab. 2] anemia: Her hemoglobin is below our target goal,but stable. She is on Epogen  3] bone and mineral disorder: Her calcium and phosphorus was range from 2 days ago. 4] hypertension: Her blood pressure is reasonably controlled 5] fluid management: No sign of fluid overload.   Patient also denies any difficulty breathing. 6] weakness is improving but still she is weak. She is getting physical therapy and seems to be improving. 7] history of anxiety disorder: Feeling better but complains of feeling sleepy and drowsy 8] hypoglycemia: Patient on D10.  Her blood sugar seems to be reasonable. Plan: 1] patient does not require dialysis today. 2] we will dialyze patient in the morning which is her regular schedule. 3] we will check her CBC renal panel in the morning.    LOS: 10 days   Ian Cavey S 02/20/2017,7:44 AM

## 2017-02-21 LAB — CBC
HEMATOCRIT: 31.3 % — AB (ref 36.0–46.0)
Hemoglobin: 9.1 g/dL — ABNORMAL LOW (ref 12.0–15.0)
MCH: 27.4 pg (ref 26.0–34.0)
MCHC: 29.1 g/dL — ABNORMAL LOW (ref 30.0–36.0)
MCV: 94.3 fL (ref 78.0–100.0)
PLATELETS: 105 10*3/uL — AB (ref 150–400)
RBC: 3.32 MIL/uL — ABNORMAL LOW (ref 3.87–5.11)
RDW: 21.4 % — AB (ref 11.5–15.5)
WBC: 6.6 10*3/uL (ref 4.0–10.5)

## 2017-02-21 LAB — RENAL FUNCTION PANEL
Albumin: 2.2 g/dL — ABNORMAL LOW (ref 3.5–5.0)
Anion gap: 18 — ABNORMAL HIGH (ref 5–15)
BUN: 70 mg/dL — AB (ref 6–20)
CO2: 20 mmol/L — ABNORMAL LOW (ref 22–32)
CREATININE: 4.37 mg/dL — AB (ref 0.44–1.00)
Calcium: 8.7 mg/dL — ABNORMAL LOW (ref 8.9–10.3)
Chloride: 91 mmol/L — ABNORMAL LOW (ref 101–111)
GFR calc Af Amer: 11 mL/min — ABNORMAL LOW (ref >60)
GFR, EST NON AFRICAN AMERICAN: 9 mL/min — AB (ref >60)
Glucose, Bld: 414 mg/dL — ABNORMAL HIGH (ref 65–99)
POTASSIUM: 5.3 mmol/L — AB (ref 3.5–5.1)
Phosphorus: 6 mg/dL — ABNORMAL HIGH (ref 2.5–4.6)
Sodium: 129 mmol/L — ABNORMAL LOW (ref 135–145)

## 2017-02-21 LAB — GLUCOSE, CAPILLARY
GLUCOSE-CAPILLARY: 225 mg/dL — AB (ref 65–99)
GLUCOSE-CAPILLARY: 224 mg/dL — AB (ref 65–99)
Glucose-Capillary: 117 mg/dL — ABNORMAL HIGH (ref 65–99)
Glucose-Capillary: 199 mg/dL — ABNORMAL HIGH (ref 65–99)

## 2017-02-21 MED ORDER — APIXABAN 2.5 MG PO TABS: 2.5000 mg | ORAL_TABLET | Freq: Two times a day (BID) | ORAL | 2 refills | Status: AC

## 2017-02-21 MED ORDER — HEPARIN SODIUM (PORCINE) 1000 UNIT/ML IJ SOLN
INTRAMUSCULAR | Status: AC
  Administered 2017-02-21: 1000 [IU] via INTRAVENOUS_CENTRAL
  Filled 2017-02-21: qty 4

## 2017-02-21 MED ORDER — EPOETIN ALFA 4000 UNIT/ML IJ SOLN
INTRAMUSCULAR | Status: AC
  Administered 2017-02-21: 4000 [IU] via INTRAVENOUS
  Filled 2017-02-21: qty 1

## 2017-02-21 MED ORDER — ALPRAZOLAM 0.5 MG PO TABS: 0.5000 mg | ORAL_TABLET | Freq: Every day | ORAL | 0 refills | Status: AC

## 2017-02-21 MED ORDER — APIXABAN 2.5 MG PO TABS
2.5000 mg | ORAL_TABLET | Freq: Two times a day (BID) | ORAL | Status: DC
  Administered 2017-02-21 – 2017-02-22 (×3): 2.5 mg via ORAL
  Filled 2017-02-21 (×3): qty 1

## 2017-02-21 NOTE — Progress Notes (Signed)
Patient received dialysis today without complications. Fluid goal was met and vitals WNL during the four hour treatment. Patient catheter was use, awaiting fistula to mature.

## 2017-02-21 NOTE — Progress Notes (Signed)
Subjective: Interval History: Patient is feeling still weak but seems to be improving.  Denies any difficulty in breathing.  Objective: Vital signs in last 24 hours: Temp:  [97 F (36.1 C)-98.3 F (36.8 C)] 98.3 F (36.8 C) (12/18 0619) Pulse Rate:  [58-72] 59 (12/18 0649) Resp:  [18-20] 20 (12/18 0619) BP: (84-98)/(36-45) 94/45 (12/18 0649) Weight:  [97.4 kg (214 lb 11.7 oz)] 97.4 kg (214 lb 11.7 oz) (12/18 6415) Weight change: 3.3 kg (7 lb 4.4 oz)  Intake/Output from previous day: 12/17 0701 - 12/18 0700 In: 600 [P.O.:600] Out: 2 [Urine:1; Stool:1] Intake/Output this shift: No intake/output data recorded.  Generally patient is alert and in no apparent distress Chest: Clear to auscultation, no rales rhonchi or egophony Heart exam revealed regular rate and rhythm no murmur Extremities: No edema of lower extremities..  Her right swelling has improved.   Lab Results: Recent Labs    02/18/17 1237 02/20/17 0854  WBC 5.7 5.2  HGB 9.3* 8.9*  HCT 31.1* 30.8*  PLT 111* 96*   BMET:  Recent Labs    02/18/17 1237 02/20/17 0854  NA 128* 130*  K 5.1 4.5  CL 91* 94*  CO2 24 26  GLUCOSE 193* 351*  BUN 63* 55*  CREATININE 4.39* 3.78*  CALCIUM 8.8* 8.5*   No results for input(s): PTH in the last 72 hours. Iron Studies: No results for input(s): IRON, TIBC, TRANSFERRIN, FERRITIN in the last 72 hours.  Studies/Results: Dg Chest Port 1 View  Result Date: 02/20/2017 CLINICAL DATA:  Coughing EXAM: PORTABLE CHEST 1 VIEW COMPARISON:  02/10/2017 FINDINGS: Right-sided central venous catheter tip overlies the proximal right atrium. Cardiomegaly with vascular congestion. Bilateral pleural effusions. Moderate diffuse hazy opacities suspicious for edema. Bibasilar consolidations. No pneumothorax. IMPRESSION: 1. Cardiomegaly with vascular congestion 2. Probable pleural effusions. Worsened hazy airspace opacity in the bilateral lungs suspicious for pulmonary edema. Continued dense bibasilar  consolidations which may reflect atelectasis or pneumonia Electronically Signed   By: Donavan Foil M.D.   On: 02/20/2017 19:52    I have reviewed the patient's current medications.  Assessment/Plan: 1] end-stage renal disease: She is status post hemodialysis on Saturday.  Her potassium is normal.  Patient is on dialysis now.  Her appetite still remains poor but she did not have any nausea or vomiting. 2] anemia: Her hemoglobin is below our target goal,but stable. She is on Epogen  3] bone and mineral disorder: Her calcium and phosphorus.  She is not on any binder. 4] hypertension: Her blood pressure is low normal.  Patient presently denies any dizziness or lightheadedness.  Patient is not on any antihypertensive medications. 5] fluid management: No sign of fluid overload.  Patient also denies any difficulty breathing. 6] weakness is improving but still she is weak. She is getting physical therapy and seems to be improving. 7] history of anxiety disorder: Feeling better but complains of feeling sleepy and drowsy 8] hypoglycemia: Has improved.  Her blood sugar is high. Plan: 1] we will try to remove about 2 L if systolic blood pressure remains above 90. 2] we will check her CBC renal panel in the morning.    LOS: 11 days   Aleya Durnell S 02/21/2017,8:12 AM

## 2017-02-21 NOTE — Progress Notes (Signed)
Physical Therapy Treatment Patient Details Name: [name] MRN: [id number] DOB: 14-Feb-1946 Today's Date: 02/21/2017    History of Present Illness [name] is a 71 y.o. female with a history of ischemic cardiomyopathy with an STEMI in 2017 with stent placements, end-stage renal disease on dialysis (recent start of dialysis), coronary artery disease, chronic combined systolic and diastolic heart failure, hypertension, GERD, hyperlipidemia, type 2 diabetes.  Patient has been feeling weak and tired over the past few weeks.  She was recently hospitalized at the end of November due to hypoglycemia secondary to insulin use and poor p.o. intake.  Her poor p.o. intake is secondary to her husband passing away last month.  Her insulin was stopped.  Her INR has been elevated over the past several days and, according to the patient, her Coumadin was stopped about a week ago.  Her INR has continued to be elevated and has not returned to normal levels.  Today, she was instructed to go to the radiology office for an INR, which was greater than 10.  Repeat in the emergency department was the same.  Laboratory data shows elevated troponin at 1.68.  Cardiology was consulted, who is doubtful that this is an NSTEMI or ischemic in nature, but applies this is secondary to elevation due to end-stage renal disease.  No palliating factors.  No chest pain    PT Comments    Patient lethargic, had dialysis in AM and agreeable for therapy, demonstrates slow labored movement with Max assist to sit up, unable to actively flex hips while seated or complete sit to stands due to BLE weakness, tolerated sitting up for approx 15 minutes before falling over to the left and required Max assist to reposition in bed.  Patient will benefit from continued physical therapy in hospital and recommended venue below to increase strength, balance, endurance for safe ADLs and gait.   Follow Up Recommendations  SNF;Supervision/Assistance - 24 hour     Equipment Recommendations  None recommended by PT    Recommendations for Other Services       Precautions / Restrictions Precautions Precautions: Fall Restrictions Weight Bearing Restrictions: No    Mobility  Bed Mobility Overal bed mobility: Needs Assistance Bed Mobility: Supine to Sit;Sit to Supine     Supine to sit: Max assist Sit to supine: Max assist   General bed mobility comments: very weak  Transfers                    Ambulation/Gait                 Stairs            Wheelchair Mobility    Modified Rankin (Stroke Patients Only)       Balance Overall balance assessment: Needs assistance Sitting-balance support: Bilateral upper extremity supported;Feet supported Sitting balance-[name]: Fair Sitting balance - Comments: fair/poor with dynamic movement Postural control: Left lateral lean                                  Cognition Arousal/Alertness: Awake/alert;Lethargic Behavior During Therapy: WFL for tasks assessed/performed Overall Cognitive Status: Within Functional Limits for tasks assessed                                        Exercises General Exercises -  Lower Extremity Long Arc Quad: Both;Seated;10 reps;AROM;Strengthening Hip Flexion/Marching: AAROM;Strengthening;Both;10 reps Toe Raises: Seated;AROM;Strengthening;Both;10 reps Heel Raises: Seated;AROM;Strengthening;Both;10 reps    General Comments        Pertinent Vitals/Pain Pain Assessment: Faces Faces Pain Scale: Hurts little more Pain Location: left heel Pain Descriptors / Indicators: Discomfort;Pressure Pain Intervention(s): Limited activity within patient's tolerance;Monitored during session    Home Living                      Prior Function            PT Goals (current goals can now be found in the care plan section) Acute Rehab PT Goals Patient Stated Goal:  Return home with family to assist PT Goal Formulation: With patient/family Time For Goal Achievement: 02/24/17 Potential to Achieve Goals: Good    Frequency    Min 3X/week      PT Plan Current plan remains appropriate    Co-evaluation              AM-PAC PT "6 Clicks" Daily Activity  Outcome Measure  Difficulty turning over in bed (including adjusting bedclothes, sheets and blankets)?: A Lot Difficulty moving from lying on back to sitting on the side of the bed? : A Lot Difficulty sitting down on and standing up from a chair with arms (e.g., wheelchair, bedside commode, etc,.)?: Unable Help needed moving to and from a bed to chair (including a wheelchair)?: Total Help needed walking in hospital room?: Total Help needed climbing 3-5 steps with a railing? : Total 6 Click Score: 8    End of Session   Activity Tolerance: Patient limited by fatigue;Patient limited by lethargy Patient left: in bed;with call bell/phone within reach;with family/visitor present Nurse Communication: Mobility status PT Visit Diagnosis: Unsteadiness on feet (R26.81);Other abnormalities of gait and mobility (R26.89);Muscle weakness (generalized) (M62.81)     Time: 5697-9480 PT Time Calculation (min) (ACUTE ONLY): 26 min  Charges:  $Therapeutic Exercise: 8-22 mins $Therapeutic Activity: 8-22 mins                    G Codes:       2:43 PM, March 08, 2017 Lonell Grandchild, MPT Physical Therapist with [facility] [phone] office [phone] mobile phone

## 2017-02-21 NOTE — Discharge Summary (Signed)
Physician Discharge Summary  [name] KZL:[id number] DOB: 1945/04/21 DOA: 02/10/2017  PCP: [name], MD  Admit date: 02/10/2017 Discharge date: 02/21/2017  Time spent: 45 minutes  Recommendations for Outpatient Follow-up:  -Will be discharged to SNF today. -She has been recently started on hemodialysis on a Tuesday, Thursday, Saturday schedule.  Next dialysis is due on Thursday 12/20. -She has been having very labile CBGs.  For now have decided to keep her off all insulin due to severe risk of hypoglycemia.  May need to start insulin slowly if CBGs continue to be in the high 200s.  Discharge Diagnoses:  Principal Problem:   Supratherapeutic INR Active Problems:   Essential hypertension   Coronary artery disease   GERD   Type 2 diabetes mellitus with stage 4 chronic kidney disease, with long-term current use of insulin (HCC)   Elevated troponin   Hyperglycemia   PAF (paroxysmal atrial fibrillation) (HCC)   Ischemic cardiomyopathy   ESRD on hemodialysis ([place])   Sacral decubitus ulcer   Failure to thrive in adult   Discharge Condition: Stable and improved  Filed Weights   02/20/17 0505 02/21/17 0619 02/21/17 0815  Weight: 94.1 kg (207 lb 7.3 oz) 97.4 kg (214 lb 11.7 oz) 97.4 kg (214 lb 11.7 oz)    History of present illness:  As per Dr. Nehemiah Settle on 12/7:  [name] is a 71 y.o. female with a history of ischemic cardiomyopathy with an STEMI in 2017 with stent placements, end-stage renal disease on dialysis (recent start of dialysis), coronary artery disease, chronic combined systolic and diastolic heart failure, hypertension, GERD, hyperlipidemia, type 2 diabetes.  Patient has been feeling weak and tired over the past few weeks.  She was recently hospitalized at the end of November due to hypoglycemia secondary to insulin use and poor p.o. intake.  Her poor p.o. intake is secondary to her husband passing away last month.  Her insulin was stopped.  Her INR  has been elevated over the past several days and, according to the patient, her Coumadin was stopped about a week ago.  Her INR has continued to be elevated and has not returned to normal levels.  Today, she was instructed to go to the radiology office for an INR, which was greater than 10.  Repeat in the emergency department was the same.  Laboratory data shows elevated troponin at 1.68.  Cardiology was consulted, who is doubtful that this is an NSTEMI or ischemic in nature, but applies this is secondary to elevation due to end-stage renal disease.  No palliating factors.  No chest pain    Hospital Course:   Supratherapeutic INR -Felt to be secondary to Coumadin use and the new end-stage renal disease patient.  She did receive FFP and vitamin K. -She was seen by hematology due to concern for possible DIC given the fact that PTT, d-dimer and LDH were also abnormal.  They did not believe patient had DIC. -She has been transitioned over to Eliquis as this is believed to be a better tolerated drug for her. -On discharge her INR is 1.8 but she has been on Eliquis for a few days.  Elevated troponin -Troponin peaked at 1.7.  She never had chest pain or EKG changes. -She was seen by cardiology and no further workup was recommended. -2D echo showed ejection fraction of 35% with grade 2 diastolic dysfunction.  End-stage renal disease -Newly started on hemodialysis on a Tuesday, Thursday, Saturday schedule. -Nephrology has been  following for dialysis needs while patient has been hospitalized. -Next dialysis after discharge needs to be on 12/20.  Atrial fibrillation -Has been taken off amiodarone due to elevated LFTs. -Is anticoagulated on Eliquis. -Has been rate controlled while hospitalized.  Diabetes -Patient has had very labile CBGs while in the hospital ranging from as low as 16 to as high as the upper 300s.  Due to this her basal insulin has been discontinued. -At this point it has been  deemed safer for her to take her off all insulin.  Since she will be going to SNF her CBGs need to be closely monitored to determine at what point starting insulin is of benefit.  I would start insulin at a very low dose and increase slowly given her severe hypoglycemia while in the hospital.  Left leg decubitus ulcer, stage II sacral decubitus ulcer -Seen by wound care, continue management as per the recommendations.  Ischemic cardiomyopathy -Echo with EF of 35%, continues to have signs of mild volume overload which will be managed with dialysis.  Depression -Her husband recently passed away during Thanksgiving. -She is still within the bereavement period.  I believe she should not be started on antidepressants yet. -She has no suicidal intent.  Elevated liver enzymes -Etiology remains unclear.  Ultrasound imaging did not indicate any significant structural abnormalities of the liver. -Amiodarone and statin have since been held. -Transaminases have normalized.   Procedures:  Hemodialysis  Consultations:  Nephrology  Cardiology  Hematology  Discharge Instructions  Discharge Instructions    Diet - low sodium heart healthy   Complete by:  As directed    Increase activity slowly   Complete by:  As directed      Allergies as of 02/21/2017      Reactions   Ace Inhibitors Cough   Amlodipine Swelling   UNSPECIFIED EDEMA   Codeine Rash      Medication List    STOP taking these medications   amiodarone 200 MG tablet Commonly known as:  PACERONE   Insulin Glargine 100 UNIT/ML Solostar Pen Commonly known as:  LANTUS   insulin lispro 100 UNIT/ML KiwkPen Commonly known as:  HUMALOG   rosuvastatin 40 MG tablet Commonly known as:  CRESTOR     TAKE these medications   albuterol 108 (90 Base) MCG/ACT inhaler Commonly known as:  PROVENTIL HFA;VENTOLIN HFA Inhale 1-2 puffs into the lungs every 6 (six) hours as needed for wheezing or shortness of breath.   albuterol  (2.5 MG/3ML) 0.083% nebulizer solution Commonly known as:  PROVENTIL Take 3 mLs (2.5 mg total) by nebulization every 6 (six) hours as needed for wheezing or shortness of breath.   ALPRAZolam 0.5 MG tablet Commonly known as:  XANAX Take 1 tablet (0.5 mg total) by mouth at bedtime.   apixaban 2.5 MG Tabs tablet Commonly known as:  ELIQUIS Take 1 tablet (2.5 mg total) by mouth 2 (two) times daily.   calcitRIOL 0.5 MCG capsule Commonly known as:  ROCALTROL Take 1 capsule (0.5 mcg total) daily by mouth.   cetirizine 10 MG tablet Commonly known as:  ZYRTEC Take 5 mg by mouth daily.   clopidogrel 75 MG tablet Commonly known as:  PLAVIX Take 1 tablet (75 mg total) by mouth daily.   cyclobenzaprine 5 MG tablet Commonly known as:  FLEXERIL Take 5 mg by mouth as needed (lower back pain). osteoporosis with out current pathological fracture for 14 days   ferrous sulfate 325 (65 FE) MG tablet Take 325  mg by mouth daily with breakfast.   fluticasone 50 MCG/ACT nasal spray Commonly known as:  FLONASE Place 1 spray into both nostrils daily.   isosorbide mononitrate 30 MG 24 hr tablet Commonly known as:  IMDUR Take 1 tablet (30 mg total) 2 (two) times daily by mouth.   latanoprost 0.005 % ophthalmic solution Commonly known as:  XALATAN Place 1 drop into both eyes at bedtime.   meclizine 25 MG tablet Commonly known as:  ANTIVERT Take 12.5 mg by mouth 2 (two) times daily.   nitroGLYCERIN 0.4 MG SL tablet Commonly known as:  NITROSTAT Place 1 tablet (0.4 mg total) under the tongue every 5 (five) minutes as needed for chest pain.   pantoprazole 40 MG tablet Commonly known as:  PROTONIX Take 40 mg by mouth daily.      Allergies  Allergen Reactions  . Ace Inhibitors Cough  . Amlodipine Swelling    UNSPECIFIED EDEMA   . Codeine Rash   Contact information for after-discharge care    [facility] Follow up.   Service:  Skilled  Nursing Contact information: [place] [phone]               The results of significant diagnostics from this hospitalization (including imaging, microbiology, ancillary and laboratory) are listed below for reference.    Significant Diagnostic Studies: Dg Chest 2 View  Result Date: 02/10/2017 CLINICAL DATA:  Cough and fatigue EXAM: CHEST  2 VIEW COMPARISON:  January 29, 2017 FINDINGS: There remains airspace consolidation in the right base with small pleural effusions bilaterally. There is cardiomegaly with pulmonary vascularity within normal limits. No adenopathy. Central catheter tip is at the cavoatrial junction. No pneumothorax. No evident bone lesions. IMPRESSION: Stable cardiac prominence with bilateral pleural effusions. Suspect superimposed pneumonia right base. Lungs elsewhere clear. Pulmonary vascularity appears within normal limits. No pneumothorax. Central catheter position unchanged. Electronically Signed   By: Lowella Grip III M.D.   On: 02/10/2017 13:58   Dg Elbow 2 Views Right  Result Date: 02/12/2017 CLINICAL DATA:  71 year old female with right elbow pain. EXAM: RIGHT ELBOW - 2 VIEW COMPARISON:  03/04/2004 FINDINGS: Please note, this is a limited evaluation due to suboptimal positioning. There is no evidence of fracture, dislocation, or joint effusion. There is no evidence of arthropathy or other focal bone abnormality. Soft tissues are unremarkable. IMPRESSION: Limited study due to suboptimal positioning. No gross osseous or soft tissue abnormalities. Electronically Signed   By: Kristopher Oppenheim M.D.   On: 02/12/2017 09:32   Dg Knee 1-2 Views Left  Result Date: 02/12/2017 CLINICAL DATA:  71 year old female with left knee pain. EXAM: LEFT KNEE - 1-2 VIEW COMPARISON:  None. FINDINGS: No evidence of fracture, dislocation, or joint effusion. No focal bone abnormality. There are mild degenerative changes with chondrocalcinosis noted.  Soft tissues are unremarkable. IMPRESSION: No acute osseous abnormality. Electronically Signed   By: Kristopher Oppenheim M.D.   On: 02/12/2017 09:31   US Abdomen Complete  Result Date: 02/15/2017 CLINICAL DATA:  Transaminitis EXAM: ABDOMEN ULTRASOUND COMPLETE COMPARISON:  CT abdomen and pelvis March 20, 2016 FINDINGS: Gallbladder: Within the gallbladder, there are multiple echogenic foci which move and shadow consistent with cholelithiasis. Largest gallstone measures 7 mm in length. In addition, there is a 3 mm echogenic focus which neither moves nor shadows, a presumed small polyp. Gallbladder wall is upper normal in thickness without edema or pericholecystic fluid. No sonographic Murphy sign  noted by sonographer. Common bile duct: Diameter: 4 mm. No intrahepatic, common hepatic, or common bile duct dilatation. Liver: No focal lesion identified. Within normal limits in parenchymal echogenicity. Portal vein is patent on color Doppler imaging with normal direction of blood flow towards the liver. IVC: No abnormality visualized. Pancreas: Visualized portion unremarkable. Portions of the pancreas are obscured by gas. Spleen: Size and appearance within normal limits. Right Kidney: Length: 10.9 cm. Echogenicity within normal limits. No hydronephrosis visualized. There is a cyst in the mid right kidney measuring 1.2 x 0.8 x 0.9 cm Left Kidney: Length: 11.1 cm. Echogenicity within normal limits. No mass or hydronephrosis visualized. Abdominal aorta: No aneurysm visualized. Other findings: No demonstrable ascites. There are pleural effusions bilaterally. IMPRESSION: 1. Cholelithiasis. There is also a 3 mm polyp within the gallbladder. 2. Portions of pancreas obscured by gas. Visualized portions of pancreas appear normal. 3.  Small cyst in mid right kidney. 4.  Pleural effusions bilaterally. Electronically Signed   By: Lowella Grip III M.D.   On: 02/15/2017 15:43   Dg Chest Port 1 View  Result Date:  02/20/2017 CLINICAL DATA:  Coughing EXAM: PORTABLE CHEST 1 VIEW COMPARISON:  02/10/2017 FINDINGS: Right-sided central venous catheter tip overlies the proximal right atrium. Cardiomegaly with vascular congestion. Bilateral pleural effusions. Moderate diffuse hazy opacities suspicious for edema. Bibasilar consolidations. No pneumothorax. IMPRESSION: 1. Cardiomegaly with vascular congestion 2. Probable pleural effusions. Worsened hazy airspace opacity in the bilateral lungs suspicious for pulmonary edema. Continued dense bibasilar consolidations which may reflect atelectasis or pneumonia Electronically Signed   By: Donavan Foil M.D.   On: 02/20/2017 19:52   Dg Chest Port 1 View  Result Date: 01/29/2017 CLINICAL DATA:  Increasing weakness after dialysis this afternoon. EXAM: PORTABLE CHEST 1 VIEW COMPARISON:  12/26/2016 FINDINGS: Interval placement of a right central venous catheter with tip over the cavoatrial junction region. No pneumothorax. Cardiac enlargement with pulmonary vascular congestion. Bilateral pleural effusions with basilar atelectasis. No significant change since previous study. IMPRESSION: Cardiac enlargement, pulmonary vascular congestion, bilateral pleural effusions, and basilar atelectasis similar to previous study. Electronically Signed   By: Lucienne Capers M.D.   On: 01/29/2017 01:09    Microbiology: No results found for this or any previous visit (from the past 240 hour(s)).   Labs: Basic Metabolic Panel: Recent Labs  Lab 02/16/17 0354 02/16/17 0830 02/17/17 0708 02/18/17 1237 02/20/17 0854 02/21/17 0848  NA 132*  --  134* 128* 130* 129*  K 6.0*  --  4.5 5.1 4.5 5.3*  CL 94*  --  95* 91* 94* 91*  CO2 24  --  27 24 26  20*  GLUCOSE 139*  --  74 193* 351* 414*  BUN 55*  --  37* 63* 55* 70*  CREATININE 4.70*  --  3.59* 4.39* 3.78* 4.37*  CALCIUM 8.8*  --  8.7* 8.8* 8.5* 8.7*  PHOS  --  6.1* 4.3  --  5.1* 6.0*   Liver Function Tests: Recent Labs  Lab  02/15/17 0420 02/16/17 0830 02/17/17 0708 02/18/17 1237 02/20/17 0854 02/21/17 0848  AST 145*  --  78* 45* 26  --   ALT 78*  --  58* 47 33  --   ALKPHOS 219*  --  213* 209* 206*  --   BILITOT 0.8  --  0.7 0.8 0.6  --   PROT 7.0  --  7.0 6.8 6.3*  --   ALBUMIN 2.6* 2.6* 2.4*  2.4* 2.3* 2.1* 2.2*   No  results for input(s): LIPASE, AMYLASE in the last 168 hours. No results for input(s): AMMONIA in the last 168 hours. CBC: Recent Labs  Lab 02/16/17 0354 02/16/17 0830 02/17/17 0708 02/18/17 1237 02/20/17 0854 02/21/17 0848  WBC 5.3 5.4 5.7 5.7 5.2 6.6  NEUTROABS 4.1  --   --   --   --   --   HGB 9.7* 9.6* 9.6* 9.3* 8.9* 9.1*  HCT 32.8* 32.8* 32.1* 31.1* 30.8* 31.3*  MCV 95.1 95.1 94.7 93.7 95.4 94.3  PLT 98*  98* 105* 107* 111* 96* 105*   Cardiac Enzymes: No results for input(s): CKTOTAL, CKMB, CKMBINDEX, TROPONINI in the last 168 hours. BNP: BNP (last 3 results) Recent Labs    10/31/16 0528 12/04/16 1820 12/23/16 1855  BNP 648.0* 2,222.9* 2,761.4*    ProBNP (last 3 results) No results for input(s): PROBNP in the last 8760 hours.  CBG: Recent Labs  Lab 02/20/17 0818 02/20/17 1223 02/20/17 1542 02/20/17 2153 02/21/17 0733  GLUCAP 334* 284* 338* 206* 225*       Signed:  Lelon Frohlich  [facility] Pager: [phone] 02/21/2017, 10:53 AM

## 2017-02-21 NOTE — Clinical Social Work Note (Signed)
Per Myriam Jacobson at [facility], [employer] company denied her SNF authorization stating that she did not meet medical necessity. Myriam Jacobson stated that attending could do a peer to peer with with Dr. Ronal Fear [phone], pending reference number [id number].   Information communicated to patient's attending.  Insurance company closed at 5 p.m. Attending to do peer to peer on 02/22/17.   Going home is not currently a safe discharge plan.      Daryon Remmert D, LCSW.

## 2017-02-22 LAB — GLUCOSE, CAPILLARY
GLUCOSE-CAPILLARY: 275 mg/dL — AB (ref 65–99)
Glucose-Capillary: 236 mg/dL — ABNORMAL HIGH (ref 65–99)

## 2017-02-22 MED ORDER — CALCIUM ACETATE (PHOS BINDER) 667 MG PO CAPS
667.0000 mg | ORAL_CAPSULE | Freq: Three times a day (TID) | ORAL | Status: DC
  Administered 2017-02-22: 667 mg via ORAL
  Filled 2017-02-22: qty 1

## 2017-02-22 MED ORDER — CALCIUM ACETATE (PHOS BINDER) 667 MG PO CAPS: 667.0000 mg | ORAL_CAPSULE | Freq: Three times a day (TID) | ORAL | 0 refills | Status: AC

## 2017-02-22 NOTE — Progress Notes (Signed)
Physical Therapy Treatment Patient Details Name: [name] MRN: [id number] DOB: [birth year] Today's Date: 02/22/2017    History of Present Illness [name] is a 71 y.o. female with a history of ischemic cardiomyopathy with an STEMI in 2017 with stent placements, end-stage renal disease on dialysis (recent start of dialysis), coronary artery disease, chronic combined systolic and diastolic heart failure, hypertension, GERD, hyperlipidemia, type 2 diabetes.  Patient has been feeling weak and tired over the past few weeks.  She was recently hospitalized at the end of November due to hypoglycemia secondary to insulin use and poor p.o. intake.  Her poor p.o. intake is secondary to her husband passing away last month.  Her insulin was stopped.  Her INR has been elevated over the past several days and, according to the patient, her Coumadin was stopped about a week ago.  Her INR has continued to be elevated and has not returned to normal levels.  Today, she was instructed to go to the radiology office for an INR, which was greater than 10.  Repeat in the emergency department was the same.  Laboratory data shows elevated troponin at 1.68.  Cardiology was consulted, who is doubtful that this is an NSTEMI or ischemic in nature, but applies this is secondary to elevation due to end-stage renal disease.  No palliating factors.  No chest pain    PT Comments    Patient limited to standing up to 1-2 minutes with Max assist, unable to take steps to attempt transfer to [facility] due to BLE weakness, poor standing balance and c/o fatigue.  Patient will benefit from continued physical therapy in hospital and recommended venue below to increase strength, balance, endurance for safe ADLs and gait.   Follow Up Recommendations  SNF;Supervision/Assistance - 24 hour     Equipment Recommendations  None recommended by PT    Recommendations for Other Services       Precautions / Restrictions  Precautions Precautions: Fall Restrictions Weight Bearing Restrictions: No    Mobility  Bed Mobility Overal bed mobility: Needs Assistance Bed Mobility: Supine to Sit;Rolling;Sit to Supine Rolling: Mod assist   Supine to sit: Max assist Sit to supine: Max assist   General bed mobility comments: slow labored movement  Transfers Overall transfer level: Needs assistance Equipment used: Rolling walker (2 wheeled) Transfers: Sit to/from Stand Sit to Stand: Max assist            Ambulation/Gait                 Stairs            Wheelchair Mobility    Modified Rankin (Stroke Patients Only)       Balance Overall balance assessment: Needs assistance Sitting-balance support: Feet supported;Bilateral upper extremity supported Sitting balance-[name]: Fair Sitting balance - Comments: fair/poor with   Standing balance support: Bilateral upper extremity supported;During functional activity Standing balance-[name]: Poor                              Cognition Arousal/Alertness: Awake/alert Behavior During Therapy: WFL for tasks assessed/performed Overall Cognitive Status: Within Functional Limits for tasks assessed                                        Exercises      General Comments  Pertinent Vitals/Pain Pain Assessment: No/denies pain    Home Living                      Prior Function            PT Goals (current goals can now be found in the care plan section) Acute Rehab PT Goals Patient Stated Goal: Return home with family to assist PT Goal Formulation: With patient Time For Goal Achievement: 02/24/17 Potential to Achieve Goals: Fair Progress towards PT goals: Progressing toward goals    Frequency    Min 3X/week      PT Plan Current plan remains appropriate    Co-evaluation              AM-PAC PT "6 Clicks" Daily Activity  Outcome Measure    Difficulty moving  from lying on back to sitting on the side of the bed? : A Lot Difficulty sitting down on and standing up from a chair with arms (e.g., wheelchair, bedside commode, etc,.)?: A Lot Help needed moving to and from a bed to chair (including a wheelchair)?: Total Help needed walking in hospital room?: Total Help needed climbing 3-5 steps with a railing? : Total 6 Click Score: 7    End of Session   Activity Tolerance: Patient limited by fatigue Patient left: in bed;with call bell/phone within reach;with nursing/sitter in room Nurse Communication: Mobility status PT Visit Diagnosis: Unsteadiness on feet (R26.81);Other abnormalities of gait and mobility (R26.89);Muscle weakness (generalized) (M62.81)     Time: 6606-3016 PT Time Calculation (min) (ACUTE ONLY): 20 min  Charges:  $Therapeutic Activity: 8-22 mins                    G Codes:       2:17 PM, 03/03/17 Lonell Grandchild, MPT Physical Therapist with [facility] [phone] office [phone] mobile phone

## 2017-02-22 NOTE — Progress Notes (Signed)
[name]  MRN: [id number]  DOB/AGE: 1945/07/23 71 y.o.  Primary Care Physician:Hill, Berneta Sages, MD  Admit date: 02/10/2017  Chief Complaint:  Chief Complaint  Patient presents with  . Weakness    S-Pt presented on  02/10/2017 with  Chief Complaint  Patient presents with  . Weakness  .    Pt offers no new complaints.Pt says " I am going to nursing home today"     Meds  . ALPRAZolam  0.5 mg Oral QHS  . apixaban  2.5 mg Oral BID  . calcitRIOL  0.5 mcg Oral Daily  . clopidogrel  75 mg Oral Daily  . epoetin (EPOGEN/PROCRIT) injection  4,000 Units Intravenous Q T,Th,Sa-HD  . feeding supplement (NEPRO CARB STEADY)  237 mL Oral BID BM  . feeding supplement (PRO-STAT SUGAR FREE 64)  30 mL Oral BID  . ferrous sulfate  325 mg Oral Q breakfast  . fluticasone  1 spray Each Nare Daily  . insulin aspart  0-9 Units Subcutaneous TID WC  . isosorbide mononitrate  30 mg Oral BID  . latanoprost  1 drop Both Eyes QHS  . levalbuterol  0.63 mg Nebulization TID  . lidocaine  1 patch Transdermal Q24H  . loratadine  10 mg Oral Daily  . LORazepam  1 mg Intravenous Once  . meclizine  12.5 mg Oral BID  . mouth rinse  15 mL Mouth Rinse BID  . milk and molasses  1 enema Rectal Once  . pantoprazole  40 mg Oral Daily  . polyethylene glycol  17 g Oral Daily  . senna-docusate  2 tablet Oral BID  . sodium chloride flush  3 mL Intravenous Q12H  . vitamin A & D   Topical QID       Physical Exam: Vital signs in last 24 hours: Temp:  [97.4 F (36.3 C)-98.3 F (36.8 C)] 98.3 F (36.8 C) (12/19 0520) Pulse Rate:  [62-88] 62 (12/19 0819) Resp:  [16-20] 16 (12/19 0819) BP: (93-109)/(41-56) 101/47 (12/19 0520) SpO2:  [93 %] 93 % (12/19 0819) Weight:  [200 lb 4.8 oz (90.9 kg)] 200 lb 4.8 oz (90.9 kg) (12/19 0520) Weight change: 0 lb (0 kg) Last BM Date: 02/21/17  Intake/Output from previous day: 12/18 0701 - 12/19 0700 In: 800 [P.O.:800] Out: 2000  No intake/output data  recorded.   Physical Exam: General- pt is lethargic but easily arousable and follows commands. Resp- No acute REsp distress,Minimal  Rhonchi CVS- S1S2 regular in rate and rhythm GIT- BS+, soft, NT, ND EXT- 1+ LE Edema, B/L foot in booties          NO Cyanosis Access-PC in situ  Lab Results: CBC Recent Labs    02/20/17 0854 02/21/17 0848  WBC 5.2 6.6  HGB 8.9* 9.1*  HCT 30.8* 31.3*  PLT 96* 105*    BMET Recent Labs    02/20/17 0854 02/21/17 0848  NA 130* 129*  K 4.5 5.3*  CL 94* 91*  CO2 26 20*  GLUCOSE 351* 414*  BUN 55* 70*  CREATININE 3.78* 4.37*  CALCIUM 8.5* 8.7*    MICRO No results found for this or any previous visit (from the past 240 hour(s)).    Lab Results  Component Value Date   PTH 270 (H) 12/27/2016   CALCIUM 8.7 (L) 02/21/2017   PHOS 6.0 (H) 02/21/2017      Impression: 1)Renal  ESRD on HD  Pt is on Tuesday/Thursday/Saturday schedule                Pt was last dialyzed yesterday                No need of HD today  2)HTN On Vasodilators.   3)Anemia In ESRD the goal for HGb is 9--11. hgb at goal  4)CKD Mineral-Bone Disorder PTH acceptable. Secondary Hyperparathyroidismpresent.. Phosphorus not at goal.   5)[facility]-admitted with Elevated trops Has hx of Afib PMD following  6)Electrolytes  Hyperkalemic    Sec to ESRD   Hyponatremic    Sec to ESRD   7)Acid base Co2 at goal   8) Pharmacy-Admitted with supra therapeutic INR    Primary team and Pharmacy following  Plan:   Will start binders Will dialyze in am Will use 2k bath Will keep on epo  Juanisha Bautch S 02/22/2017, 9:28 AM

## 2017-02-22 NOTE — Progress Notes (Signed)
IV removed, 2x2 gauze and paper tape applied to site, no bleeding noted, Patient transferred to [facility] an Rehab via EMS.

## 2017-02-22 NOTE — Progress Notes (Signed)
Report called to Theadora Rama, Therapist, sports at [facility] and Rehab and all questions answered.  Patient awaiting transport via EMS to go to the facility.

## 2017-02-22 NOTE — Clinical Social Work Placement (Signed)
   CLINICAL SOCIAL WORK PLACEMENT  NOTE  Date:  02/22/2017  Patient Details  Name: [name] MRN: [id number] Date of Birth: 1945-12-25  Clinical Social Work is seeking post-discharge placement for this patient at the [place] level of care (*CSW will initial, date and re-position this form in  chart as items are completed):  Yes   Patient/family provided with [place] Work Department's list of facilities offering this level of care within the geographic area requested by the patient (or if unable, by the patient's family).  Yes   Patient/family informed of their freedom to choose among providers that offer the needed level of care, that participate in Medicare, Medicaid or managed care program needed by the patient, have an available bed and are willing to accept the patient.  Yes   Patient/family informed of [facility]'s ownership interest in [facility] and [facility], as well as of the fact that they are under no obligation to receive care at these facilities.  PASRR submitted to EDS on       PASRR number received on       Existing PASRR number confirmed on       FL2 transmitted to all facilities in geographic area requested by pt/family on       FL2 transmitted to all facilities within larger geographic area on       Patient informed that his/her managed care company has contracts with or will negotiate with certain facilities, including the following:        Yes   Patient/family informed of bed offers received.  Patient chooses bed at [facility]     Physician recommends and patient chooses bed at      Patient to be transferred to [facility] on 02/22/17.  Patient to be transferred to facility by [facility]     Patient family notified on 02/22/17 of transfer.  Name of family member notified:  message left for [facility]     PHYSICIAN       Additional Comment:  Discharge  Clinicals sent. LCSW signing off.   _______________________________________________ Ihor Gully, LCSW 02/22/2017, 3:21 PM

## 2017-02-22 NOTE — Progress Notes (Addendum)
Patient briefly seen and examined, database reviewed.  No new events since yesterday.  She generally appears well, in no distress.  Patient was discharged to SNF on 12/18, however due to insurance authorization had to stay an extra night.  I have had to do two peer to peer reviews today with her insurance company as I believe it is very unsafe for her to go back home.  She has now been approved and will be discharging today.  No changes to discharge summary dictated 12/18.  Domingo Mend, MD [facility] Pager: [phone]

## 2017-02-25 ENCOUNTER — Inpatient Hospital Stay (HOSPITAL_COMMUNITY): Payer: BLUE CROSS/BLUE SHIELD

## 2017-02-25 ENCOUNTER — Inpatient Hospital Stay (HOSPITAL_COMMUNITY)
Admission: AD | Admit: 2017-02-25 | Discharge: 2017-03-07 | DRG: 871 | Disposition: E | Payer: BLUE CROSS/BLUE SHIELD | Source: Other Acute Inpatient Hospital | Attending: Pulmonary Disease | Admitting: Pulmonary Disease

## 2017-02-25 DIAGNOSIS — R627 Adult failure to thrive: Secondary | ICD-10-CM | POA: Diagnosis present

## 2017-02-25 DIAGNOSIS — I5042 Chronic combined systolic (congestive) and diastolic (congestive) heart failure: Secondary | ICD-10-CM | POA: Diagnosis present

## 2017-02-25 DIAGNOSIS — I2510 Atherosclerotic heart disease of native coronary artery without angina pectoris: Secondary | ICD-10-CM | POA: Diagnosis present

## 2017-02-25 DIAGNOSIS — Z86718 Personal history of other venous thrombosis and embolism: Secondary | ICD-10-CM

## 2017-02-25 DIAGNOSIS — N186 End stage renal disease: Secondary | ICD-10-CM | POA: Diagnosis present

## 2017-02-25 DIAGNOSIS — R791 Abnormal coagulation profile: Secondary | ICD-10-CM | POA: Diagnosis present

## 2017-02-25 DIAGNOSIS — Z992 Dependence on renal dialysis: Secondary | ICD-10-CM

## 2017-02-25 DIAGNOSIS — Z955 Presence of coronary angioplasty implant and graft: Secondary | ICD-10-CM | POA: Diagnosis not present

## 2017-02-25 DIAGNOSIS — Z66 Do not resuscitate: Secondary | ICD-10-CM | POA: Diagnosis present

## 2017-02-25 DIAGNOSIS — R570 Cardiogenic shock: Secondary | ICD-10-CM | POA: Diagnosis present

## 2017-02-25 DIAGNOSIS — I255 Ischemic cardiomyopathy: Secondary | ICD-10-CM | POA: Diagnosis present

## 2017-02-25 DIAGNOSIS — L89152 Pressure ulcer of sacral region, stage 2: Secondary | ICD-10-CM | POA: Diagnosis present

## 2017-02-25 DIAGNOSIS — D631 Anemia in chronic kidney disease: Secondary | ICD-10-CM | POA: Diagnosis present

## 2017-02-25 DIAGNOSIS — T45515A Adverse effect of anticoagulants, initial encounter: Secondary | ICD-10-CM | POA: Diagnosis present

## 2017-02-25 DIAGNOSIS — J9601 Acute respiratory failure with hypoxia: Secondary | ICD-10-CM | POA: Diagnosis present

## 2017-02-25 DIAGNOSIS — I480 Paroxysmal atrial fibrillation: Secondary | ICD-10-CM | POA: Diagnosis present

## 2017-02-25 DIAGNOSIS — N3281 Overactive bladder: Secondary | ICD-10-CM | POA: Diagnosis present

## 2017-02-25 DIAGNOSIS — Z90710 Acquired absence of both cervix and uterus: Secondary | ICD-10-CM

## 2017-02-25 DIAGNOSIS — Z7951 Long term (current) use of inhaled steroids: Secondary | ICD-10-CM

## 2017-02-25 DIAGNOSIS — K7290 Hepatic failure, unspecified without coma: Secondary | ICD-10-CM | POA: Diagnosis present

## 2017-02-25 DIAGNOSIS — I493 Ventricular premature depolarization: Secondary | ICD-10-CM | POA: Diagnosis present

## 2017-02-25 DIAGNOSIS — E872 Acidosis, unspecified: Secondary | ICD-10-CM

## 2017-02-25 DIAGNOSIS — I361 Nonrheumatic tricuspid (valve) insufficiency: Secondary | ICD-10-CM

## 2017-02-25 DIAGNOSIS — R579 Shock, unspecified: Secondary | ICD-10-CM

## 2017-02-25 DIAGNOSIS — I252 Old myocardial infarction: Secondary | ICD-10-CM

## 2017-02-25 DIAGNOSIS — Z7901 Long term (current) use of anticoagulants: Secondary | ICD-10-CM

## 2017-02-25 DIAGNOSIS — A419 Sepsis, unspecified organism: Secondary | ICD-10-CM | POA: Diagnosis present

## 2017-02-25 DIAGNOSIS — L89899 Pressure ulcer of other site, unspecified stage: Secondary | ICD-10-CM | POA: Diagnosis present

## 2017-02-25 DIAGNOSIS — Z87440 Personal history of urinary (tract) infections: Secondary | ICD-10-CM | POA: Diagnosis not present

## 2017-02-25 DIAGNOSIS — G9340 Encephalopathy, unspecified: Secondary | ICD-10-CM | POA: Diagnosis not present

## 2017-02-25 DIAGNOSIS — J9600 Acute respiratory failure, unspecified whether with hypoxia or hypercapnia: Secondary | ICD-10-CM

## 2017-02-25 DIAGNOSIS — E1122 Type 2 diabetes mellitus with diabetic chronic kidney disease: Secondary | ICD-10-CM | POA: Diagnosis present

## 2017-02-25 DIAGNOSIS — Z8249 Family history of ischemic heart disease and other diseases of the circulatory system: Secondary | ICD-10-CM

## 2017-02-25 DIAGNOSIS — J90 Pleural effusion, not elsewhere classified: Secondary | ICD-10-CM

## 2017-02-25 DIAGNOSIS — R001 Bradycardia, unspecified: Secondary | ICD-10-CM | POA: Diagnosis present

## 2017-02-25 DIAGNOSIS — R6521 Severe sepsis with septic shock: Secondary | ICD-10-CM | POA: Diagnosis present

## 2017-02-25 DIAGNOSIS — K219 Gastro-esophageal reflux disease without esophagitis: Secondary | ICD-10-CM | POA: Diagnosis present

## 2017-02-25 DIAGNOSIS — Q438 Other specified congenital malformations of intestine: Secondary | ICD-10-CM | POA: Diagnosis not present

## 2017-02-25 DIAGNOSIS — I132 Hypertensive heart and chronic kidney disease with heart failure and with stage 5 chronic kidney disease, or end stage renal disease: Secondary | ICD-10-CM | POA: Diagnosis present

## 2017-02-25 DIAGNOSIS — E785 Hyperlipidemia, unspecified: Secondary | ICD-10-CM | POA: Diagnosis present

## 2017-02-25 DIAGNOSIS — Z823 Family history of stroke: Secondary | ICD-10-CM

## 2017-02-25 DIAGNOSIS — Z7902 Long term (current) use of antithrombotics/antiplatelets: Secondary | ICD-10-CM

## 2017-02-25 DIAGNOSIS — E11649 Type 2 diabetes mellitus with hypoglycemia without coma: Secondary | ICD-10-CM | POA: Diagnosis present

## 2017-02-25 DIAGNOSIS — Z79899 Other long term (current) drug therapy: Secondary | ICD-10-CM

## 2017-02-25 DIAGNOSIS — Z4659 Encounter for fitting and adjustment of other gastrointestinal appliance and device: Secondary | ICD-10-CM

## 2017-02-25 DIAGNOSIS — Z833 Family history of diabetes mellitus: Secondary | ICD-10-CM

## 2017-02-25 LAB — RENAL FUNCTION PANEL
ALBUMIN: 2.1 g/dL — AB (ref 3.5–5.0)
Anion gap: 21 — ABNORMAL HIGH (ref 5–15)
BUN: 29 mg/dL — AB (ref 6–20)
CO2: 16 mmol/L — ABNORMAL LOW (ref 22–32)
CREATININE: 2.34 mg/dL — AB (ref 0.44–1.00)
Calcium: 7.5 mg/dL — ABNORMAL LOW (ref 8.9–10.3)
Chloride: 93 mmol/L — ABNORMAL LOW (ref 101–111)
GFR calc Af Amer: 23 mL/min — ABNORMAL LOW (ref >60)
GFR, EST NON AFRICAN AMERICAN: 20 mL/min — AB (ref >60)
Glucose, Bld: 406 mg/dL — ABNORMAL HIGH (ref 65–99)
PHOSPHORUS: 3 mg/dL (ref 2.5–4.6)
POTASSIUM: 3.5 mmol/L (ref 3.5–5.1)
Sodium: 130 mmol/L — ABNORMAL LOW (ref 135–145)

## 2017-02-25 LAB — RESPIRATORY PANEL BY PCR
ADENOVIRUS-RVPPCR: NOT DETECTED
BORDETELLA PERTUSSIS-RVPCR: NOT DETECTED
CHLAMYDOPHILA PNEUMONIAE-RVPPCR: NOT DETECTED
CORONAVIRUS HKU1-RVPPCR: NOT DETECTED
CORONAVIRUS NL63-RVPPCR: NOT DETECTED
Coronavirus 229E: NOT DETECTED
Coronavirus OC43: NOT DETECTED
Influenza A: NOT DETECTED
Influenza B: NOT DETECTED
MYCOPLASMA PNEUMONIAE-RVPPCR: NOT DETECTED
Metapneumovirus: NOT DETECTED
Parainfluenza Virus 1: NOT DETECTED
Parainfluenza Virus 2: NOT DETECTED
Parainfluenza Virus 3: NOT DETECTED
Parainfluenza Virus 4: NOT DETECTED
Respiratory Syncytial Virus: NOT DETECTED
Rhinovirus / Enterovirus: NOT DETECTED

## 2017-02-25 LAB — POCT I-STAT 7, (LYTES, BLD GAS, ICA,H+H)
ACID-BASE DEFICIT: 21 mmol/L — AB (ref 0.0–2.0)
BICARBONATE: 6.9 mmol/L — AB (ref 20.0–28.0)
Calcium, Ion: 0.96 mmol/L — ABNORMAL LOW (ref 1.15–1.40)
HEMATOCRIT: 36 % (ref 36.0–46.0)
HEMOGLOBIN: 12.2 g/dL (ref 12.0–15.0)
O2 SAT: 95 %
PCO2 ART: 19.9 mmHg — AB (ref 32.0–48.0)
PH ART: 7.139 — AB (ref 7.350–7.450)
PO2 ART: 91 mmHg (ref 83.0–108.0)
Patient temperature: 96
Potassium: 4.2 mmol/L (ref 3.5–5.1)
Sodium: 127 mmol/L — ABNORMAL LOW (ref 135–145)
TCO2: 8 mmol/L — ABNORMAL LOW (ref 22–32)

## 2017-02-25 LAB — URINALYSIS, ROUTINE W REFLEX MICROSCOPIC
Bilirubin Urine: NEGATIVE
GLUCOSE, UA: NEGATIVE mg/dL
KETONES UR: NEGATIVE mg/dL
Nitrite: NEGATIVE
PROTEIN: NEGATIVE mg/dL
Specific Gravity, Urine: 1.005 (ref 1.005–1.030)
pH: 7 (ref 5.0–8.0)

## 2017-02-25 LAB — INFLUENZA PANEL BY PCR (TYPE A & B)
INFLAPCR: NEGATIVE
INFLBPCR: NEGATIVE

## 2017-02-25 LAB — COMPREHENSIVE METABOLIC PANEL
ALBUMIN: 2.2 g/dL — AB (ref 3.5–5.0)
ALK PHOS: 237 U/L — AB (ref 38–126)
ALT: 41 U/L (ref 14–54)
AST: 88 U/L — AB (ref 15–41)
Anion gap: 33 — ABNORMAL HIGH (ref 5–15)
BILIRUBIN TOTAL: 1.8 mg/dL — AB (ref 0.3–1.2)
BUN: 42 mg/dL — AB (ref 6–20)
CALCIUM: 8.6 mg/dL — AB (ref 8.9–10.3)
CO2: 9 mmol/L — ABNORMAL LOW (ref 22–32)
Chloride: 90 mmol/L — ABNORMAL LOW (ref 101–111)
Creatinine, Ser: 3.55 mg/dL — ABNORMAL HIGH (ref 0.44–1.00)
GFR calc Af Amer: 14 mL/min — ABNORMAL LOW (ref >60)
GFR calc non Af Amer: 12 mL/min — ABNORMAL LOW (ref >60)
GLUCOSE: 415 mg/dL — AB (ref 65–99)
POTASSIUM: 4.7 mmol/L (ref 3.5–5.1)
Sodium: 132 mmol/L — ABNORMAL LOW (ref 135–145)
TOTAL PROTEIN: 6.6 g/dL (ref 6.5–8.1)

## 2017-02-25 LAB — MRSA PCR SCREENING: MRSA by PCR: NEGATIVE

## 2017-02-25 LAB — PHOSPHORUS: Phosphorus: 7.2 mg/dL — ABNORMAL HIGH (ref 2.5–4.6)

## 2017-02-25 LAB — CBC
HCT: 33.7 % — ABNORMAL LOW (ref 36.0–46.0)
HEMOGLOBIN: 9.7 g/dL — AB (ref 12.0–15.0)
MCH: 27.9 pg (ref 26.0–34.0)
MCHC: 28.8 g/dL — ABNORMAL LOW (ref 30.0–36.0)
MCV: 96.8 fL (ref 78.0–100.0)
Platelets: 121 10*3/uL — ABNORMAL LOW (ref 150–400)
RBC: 3.48 MIL/uL — AB (ref 3.87–5.11)
RDW: 22.4 % — ABNORMAL HIGH (ref 11.5–15.5)
WBC: 8.6 10*3/uL (ref 4.0–10.5)

## 2017-02-25 LAB — MAGNESIUM: Magnesium: 2.2 mg/dL (ref 1.7–2.4)

## 2017-02-25 LAB — GLUCOSE, CAPILLARY
GLUCOSE-CAPILLARY: 80 mg/dL (ref 65–99)
GLUCOSE-CAPILLARY: 66 mg/dL (ref 65–99)
GLUCOSE-CAPILLARY: 409 mg/dL — AB (ref 65–99)
GLUCOSE-CAPILLARY: 341 mg/dL — AB (ref 65–99)
Glucose-Capillary: 489 mg/dL — ABNORMAL HIGH (ref 65–99)
Glucose-Capillary: 490 mg/dL — ABNORMAL HIGH (ref 65–99)
Glucose-Capillary: 427 mg/dL — ABNORMAL HIGH (ref 65–99)

## 2017-02-25 LAB — POCT I-STAT 3, ART BLOOD GAS (G3+)
Acid-base deficit: 19 mmol/L — ABNORMAL HIGH (ref 0.0–2.0)
Bicarbonate: 8.8 mmol/L — ABNORMAL LOW (ref 20.0–28.0)
O2 Saturation: 100 %
PCO2 ART: 24.2 mmHg — AB (ref 32.0–48.0)
PH ART: 7.163 — AB (ref 7.350–7.450)
PO2 ART: 243 mmHg — AB (ref 83.0–108.0)
Patient temperature: 96
TCO2: 10 mmol/L — ABNORMAL LOW (ref 22–32)

## 2017-02-25 LAB — DIC (DISSEMINATED INTRAVASCULAR COAGULATION) PANEL
PLATELETS: 90 10*3/uL — AB (ref 150–400)
SMEAR REVIEW: NONE SEEN

## 2017-02-25 LAB — DIC (DISSEMINATED INTRAVASCULAR COAGULATION)PANEL
D-Dimer, Quant: 1.73 ug/mL-FEU — ABNORMAL HIGH (ref 0.00–0.50)
Fibrinogen: 637 mg/dL — ABNORMAL HIGH (ref 210–475)
INR: >10
Prothrombin Time: >90 seconds — ABNORMAL HIGH (ref 11.4–15.2)
aPTT: 58 seconds — ABNORMAL HIGH (ref 24–36)

## 2017-02-25 LAB — ECHOCARDIOGRAM COMPLETE
Height: 68 in
Weight: 3449.76 oz

## 2017-02-25 LAB — PROCALCITONIN: Procalcitonin: 1.47 ng/mL

## 2017-02-25 LAB — COOXEMETRY PANEL
Carboxyhemoglobin: 0.6 % (ref 0.5–1.5)
METHEMOGLOBIN: 1.4 % (ref 0.0–1.5)
O2 SAT: 72.5 %
TOTAL HEMOGLOBIN: 5.6 g/dL — AB (ref 12.0–16.0)

## 2017-02-25 LAB — PREPARE RBC (CROSSMATCH)

## 2017-02-25 LAB — LACTIC ACID, PLASMA
LACTIC ACID, VENOUS: 18.1 mmol/L — AB (ref 0.5–1.9)
Lactic Acid, Venous: 13.6 mmol/L (ref 0.5–1.9)

## 2017-02-25 LAB — TROPONIN I: Troponin I: 0.77 ng/mL (ref <0.03)

## 2017-02-25 LAB — AMMONIA: Ammonia: 50 umol/L — ABNORMAL HIGH (ref 9–35)

## 2017-02-25 LAB — PROTIME-INR

## 2017-02-25 MED ORDER — PRISMASOL BGK 4/2.5 32-4-2.5 MEQ/L IV SOLN
INTRAVENOUS | Status: DC
  Administered 2017-02-25 – 2017-02-26 (×4): via INTRAVENOUS_CENTRAL
  Filled 2017-02-25 (×7): qty 5000

## 2017-02-25 MED ORDER — SODIUM CHLORIDE 0.9 % IV SOLN: Freq: Once | INTRAVENOUS | Status: DC

## 2017-02-25 MED ORDER — ATROPINE SULFATE 1 MG/10ML IJ SOSY
PREFILLED_SYRINGE | INTRAMUSCULAR | Status: AC
  Filled 2017-02-25: qty 10

## 2017-02-25 MED ORDER — VANCOMYCIN HCL IN DEXTROSE 1-5 GM/200ML-% IV SOLN
1000.0000 mg | INTRAVENOUS | Status: DC
  Filled 2017-02-25: qty 200

## 2017-02-25 MED ORDER — ROCURONIUM BROMIDE 50 MG/5ML IV SOLN
1.0000 mg/kg | Freq: Once | INTRAVENOUS | Status: AC
  Administered 2017-02-25: 97.8 mg via INTRAVENOUS

## 2017-02-25 MED ORDER — FENTANYL CITRATE (PF) 100 MCG/2ML IJ SOLN
50.0000 ug | INTRAMUSCULAR | Status: DC | PRN
  Administered 2017-02-25 – 2017-02-26 (×4): 50 ug via INTRAVENOUS
  Filled 2017-02-25 (×3): qty 2

## 2017-02-25 MED ORDER — DEXTROSE 5 % IV SOLN
INTRAVENOUS | Status: DC
  Administered 2017-02-25 – 2017-02-26 (×4): via INTRAVENOUS
  Filled 2017-02-25 (×5): qty 150

## 2017-02-25 MED ORDER — EPINEPHRINE PF 1 MG/ML IJ SOLN
0.5000 ug/min | INTRAVENOUS | Status: DC
  Administered 2017-02-25: 1 ug/min via INTRAVENOUS
  Administered 2017-02-25 (×2): 12 ug/min via INTRAVENOUS
  Administered 2017-02-26: 7 ug/min via INTRAVENOUS
  Administered 2017-02-26: 20 ug/min via INTRAVENOUS
  Administered 2017-02-26: 12 ug/min via INTRAVENOUS
  Filled 2017-02-25 (×6): qty 4

## 2017-02-25 MED ORDER — HEPARIN SODIUM (PORCINE) 5000 UNIT/ML IJ SOLN: 5000.0000 [IU] | Freq: Three times a day (TID) | INTRAMUSCULAR | Status: DC

## 2017-02-25 MED ORDER — NOREPINEPHRINE BITARTRATE 1 MG/ML IV SOLN
0.0000 ug/min | INTRAVENOUS | Status: DC
  Administered 2017-02-25: 40 ug/min via INTRAVENOUS
  Administered 2017-02-25: 30 ug/min via INTRAVENOUS
  Administered 2017-02-25: 37 ug/min via INTRAVENOUS
  Administered 2017-02-26: 36 ug/min via INTRAVENOUS
  Administered 2017-02-26: 38 ug/min via INTRAVENOUS
  Filled 2017-02-25 (×5): qty 16

## 2017-02-25 MED ORDER — DEXTROSE 10 % IV SOLN
INTRAVENOUS | Status: DC
  Administered 2017-02-25: 02:00:00 via INTRAVENOUS

## 2017-02-25 MED ORDER — VASOPRESSIN 20 UNIT/ML IV SOLN
0.0300 [IU]/min | INTRAVENOUS | Status: DC
  Administered 2017-02-25 – 2017-02-26 (×2): 0.03 [IU]/min via INTRAVENOUS
  Filled 2017-02-25 (×2): qty 2

## 2017-02-25 MED ORDER — MIDAZOLAM HCL 2 MG/2ML IJ SOLN
INTRAMUSCULAR | Status: AC
  Filled 2017-02-25: qty 2

## 2017-02-25 MED ORDER — ETOMIDATE 2 MG/ML IV SOLN
20.0000 mg | Freq: Once | INTRAVENOUS | Status: AC
  Administered 2017-02-25: 20 mg via INTRAVENOUS

## 2017-02-25 MED ORDER — FLUDROCORTISONE ACETATE 0.1 MG PO TABS
0.0500 mg | ORAL_TABLET | ORAL | Status: DC
  Administered 2017-02-25: 0.05 mg via ORAL
  Filled 2017-02-25 (×3): qty 0.5

## 2017-02-25 MED ORDER — POTASSIUM CHLORIDE 10 MEQ/50ML IV SOLN: 10.0000 meq | INTRAVENOUS | Status: DC

## 2017-02-25 MED ORDER — DOPAMINE-DEXTROSE 3.2-5 MG/ML-% IV SOLN
0.0000 ug/kg/min | INTRAVENOUS | Status: DC
  Administered 2017-02-25: 20 ug/kg/min via INTRAVENOUS

## 2017-02-25 MED ORDER — INSULIN ASPART 100 UNIT/ML ~~LOC~~ SOLN
0.0000 [IU] | SUBCUTANEOUS | Status: DC
  Administered 2017-02-25 (×3): 20 [IU] via SUBCUTANEOUS
  Administered 2017-02-25: 15 [IU] via SUBCUTANEOUS
  Administered 2017-02-26: 4 [IU] via SUBCUTANEOUS
  Administered 2017-02-26: 3 [IU] via SUBCUTANEOUS
  Administered 2017-02-26: 11 [IU] via SUBCUTANEOUS

## 2017-02-25 MED ORDER — VANCOMYCIN HCL IN DEXTROSE 1-5 GM/200ML-% IV SOLN
1000.0000 mg | INTRAVENOUS | Status: DC
  Administered 2017-02-25: 1000 mg via INTRAVENOUS
  Filled 2017-02-25 (×2): qty 200

## 2017-02-25 MED ORDER — SODIUM BICARBONATE 8.4 % IV SOLN
INTRAVENOUS | Status: AC
  Administered 2017-02-25: 03:00:00
  Filled 2017-02-25: qty 100

## 2017-02-25 MED ORDER — DOPAMINE-DEXTROSE 3.2-5 MG/ML-% IV SOLN
INTRAVENOUS | Status: AC
  Filled 2017-02-25: qty 250

## 2017-02-25 MED ORDER — HEPARIN SODIUM (PORCINE) 1000 UNIT/ML DIALYSIS
1000.0000 [IU] | INTRAMUSCULAR | Status: DC | PRN
Start: 2017-02-25 — End: 2017-02-26
  Administered 2017-02-26: 3800 [IU] via INTRAVENOUS_CENTRAL
  Filled 2017-02-25: qty 4
  Filled 2017-02-25 (×2): qty 6

## 2017-02-25 MED ORDER — INSULIN ASPART 100 UNIT/ML ~~LOC~~ SOLN: 2.0000 [IU] | SUBCUTANEOUS | Status: DC

## 2017-02-25 MED ORDER — SODIUM BICARBONATE 8.4 % IV SOLN: 100.0000 meq | Freq: Once | INTRAVENOUS | Status: AC

## 2017-02-25 MED ORDER — FENTANYL CITRATE (PF) 100 MCG/2ML IJ SOLN
INTRAMUSCULAR | Status: AC
  Administered 2017-02-25: 50 ug via INTRAVENOUS
  Filled 2017-02-25: qty 2

## 2017-02-25 MED ORDER — LACTULOSE 10 GM/15ML PO SOLN
20.0000 g | Freq: Four times a day (QID) | ORAL | Status: AC
  Administered 2017-02-25 (×4): 20 g
  Filled 2017-02-25 (×4): qty 30

## 2017-02-25 MED ORDER — DOPAMINE-DEXTROSE 3.2-5 MG/ML-% IV SOLN
0.0000 ug/kg/min | INTRAVENOUS | Status: DC
  Administered 2017-02-25: 15 ug/kg/min via INTRAVENOUS
  Administered 2017-02-25: 16.687 ug/kg/min via INTRAVENOUS
  Administered 2017-02-25: 20.886 ug/kg/min via INTRAVENOUS
  Administered 2017-02-26: 13 ug/kg/min via INTRAVENOUS
  Filled 2017-02-25 (×4): qty 250

## 2017-02-25 MED ORDER — EPINEPHRINE PF 1 MG/10ML IJ SOSY
PREFILLED_SYRINGE | INTRAMUSCULAR | Status: AC
  Filled 2017-02-25: qty 10

## 2017-02-25 MED ORDER — PIPERACILLIN-TAZOBACTAM 3.375 G IVPB 30 MIN
3.3750 g | Freq: Four times a day (QID) | INTRAVENOUS | Status: DC
  Administered 2017-02-25 – 2017-02-26 (×6): 3.375 g via INTRAVENOUS
  Filled 2017-02-25 (×8): qty 50

## 2017-02-25 MED ORDER — PANTOPRAZOLE SODIUM 40 MG IV SOLR
40.0000 mg | INTRAVENOUS | Status: DC
  Administered 2017-02-25 – 2017-02-26 (×2): 40 mg via INTRAVENOUS
  Filled 2017-02-25 (×2): qty 40

## 2017-02-25 MED ORDER — SODIUM CHLORIDE 0.9 % IV SOLN
250.0000 mL | INTRAVENOUS | Status: DC | PRN
  Administered 2017-02-25: 250 mL via INTRAVENOUS

## 2017-02-25 MED ORDER — NOREPINEPHRINE BITARTRATE 1 MG/ML IV SOLN
0.0000 ug/min | INTRAVENOUS | Status: DC
  Administered 2017-02-25: 40 ug/min via INTRAVENOUS
  Filled 2017-02-25 (×2): qty 4

## 2017-02-25 MED ORDER — PRISMASOL BGK 4/2.5 32-4-2.5 MEQ/L IV SOLN
INTRAVENOUS | Status: DC
  Administered 2017-02-25 – 2017-02-26 (×10): via INTRAVENOUS_CENTRAL
  Filled 2017-02-25 (×18): qty 5000

## 2017-02-25 MED ORDER — FENTANYL CITRATE (PF) 100 MCG/2ML IJ SOLN: 50.0000 ug | Freq: Once | INTRAMUSCULAR | Status: DC

## 2017-02-25 MED ORDER — PERFLUTREN LIPID MICROSPHERE
1.0000 mL | INTRAVENOUS | Status: AC | PRN
  Administered 2017-02-25: 10 mL via INTRAVENOUS
  Filled 2017-02-25: qty 10

## 2017-02-25 MED ORDER — FENTANYL CITRATE (PF) 100 MCG/2ML IJ SOLN
50.0000 ug | INTRAMUSCULAR | Status: DC | PRN
  Administered 2017-02-25: 50 ug via INTRAVENOUS
  Filled 2017-02-25: qty 2

## 2017-02-25 MED ORDER — PRISMASOL BGK 4/2.5 32-4-2.5 MEQ/L IV SOLN
INTRAVENOUS | Status: DC
  Administered 2017-02-25 – 2017-02-26 (×7): via INTRAVENOUS_CENTRAL
  Filled 2017-02-25 (×13): qty 5000

## 2017-02-25 MED ORDER — PIPERACILLIN-TAZOBACTAM 3.375 G IVPB
3.3750 g | Freq: Two times a day (BID) | INTRAVENOUS | Status: DC
  Filled 2017-02-25: qty 50

## 2017-02-25 MED ORDER — HYDROCORTISONE NA SUCCINATE PF 100 MG IJ SOLR
50.0000 mg | Freq: Four times a day (QID) | INTRAMUSCULAR | Status: DC
  Administered 2017-02-25 – 2017-02-26 (×6): 50 mg via INTRAVENOUS
  Filled 2017-02-25 (×2): qty 1
  Filled 2017-02-25: qty 2
  Filled 2017-02-25 (×6): qty 1

## 2017-02-25 MED ORDER — SODIUM CHLORIDE 0.9 % FOR CRRT
INTRAVENOUS_CENTRAL | Status: DC | PRN
  Filled 2017-02-25: qty 1000

## 2017-02-25 MED ORDER — ALBUTEROL SULFATE (2.5 MG/3ML) 0.083% IN NEBU: 2.5000 mg | INHALATION_SOLUTION | RESPIRATORY_TRACT | Status: DC | PRN

## 2017-02-25 MED ORDER — ORAL CARE MOUTH RINSE
15.0000 mL | Freq: Four times a day (QID) | OROMUCOSAL | Status: DC
  Administered 2017-02-25 – 2017-02-26 (×5): 15 mL via OROMUCOSAL

## 2017-02-25 MED ORDER — CHLORHEXIDINE GLUCONATE 0.12% ORAL RINSE (MEDLINE KIT)
15.0000 mL | Freq: Two times a day (BID) | OROMUCOSAL | Status: DC
  Administered 2017-02-25 – 2017-02-26 (×2): 15 mL via OROMUCOSAL

## 2017-02-25 NOTE — Procedures (Signed)
I was present at this dialysis session. I have reviewed the session itself and made appropriate changes.   Now on 4 pressors after starting CRRT.  Running positive. Very tenuous for CRRT currently, if further worsens will need to stop as to unstable to continue.    Filed Weights   02/14/2017 0230 03/02/2017 0500  Weight: 97.8 kg (215 lb 9.8 oz) 97.8 kg (215 lb 9.8 oz)    Recent Labs  Lab 03/03/2017 0226 02/07/2017 0535  NA 132* 127*  K 4.7 4.2  CL 90*  --   CO2 9*  --   GLUCOSE 415*  --   BUN 42*  --   CREATININE 3.55*  --   CALCIUM 8.6*  --   PHOS 7.2*  --     Recent Labs  Lab 02/20/17 0854 02/21/17 0848 02/23/2017 0226 02/08/2017 0535  WBC 5.2 6.6 8.6  --   HGB 8.9* 9.1* 9.7* 12.2  HCT 30.8* 31.3* 33.7* 36.0  MCV 95.4 94.3 96.8  --   PLT 96* 105* 121*  --     Scheduled Meds: . atropine      . EPINEPHrine      . fentaNYL (SUBLIMAZE) injection  50 mcg Intravenous Once  . fludrocortisone  0.05 mg Oral Q24H  . hydrocortisone sod succinate (SOLU-CORTEF) inj  50 mg Intravenous Q6H  . insulin aspart  0-20 Units Subcutaneous Q4H  . lactulose  20 g Per Tube Q6H  . pantoprazole (PROTONIX) IV  40 mg Intravenous Q24H   Continuous Infusions: . sodium chloride    . dextrose Stopped (03/04/2017 0569)  . DOPamine    . epinephrine 25.067 mcg/min (02/05/2017 1000)  . norepinephrine (LEVOPHED) Adult infusion 40.533 mcg/min (03/04/2017 1000)  . piperacillin-tazobactam 3.375 g (02/13/2017 1008)  . dialysis replacement fluid (prismasate) 1,000 mL/hr at 02/05/2017 0930  . dialysis replacement fluid (prismasate) 500 mL/hr at 02/13/2017 0931  . dialysate (PRISMASATE) 1,500 mL/hr at 02/08/2017 0930  .  sodium bicarbonate  infusion 1000 mL 125 mL/hr at 02/18/2017 0900  . sodium chloride    . vancomycin    . vasopressin (PITRESSIN) infusion - *FOR SHOCK* 0.03 Units/min (03/05/2017 1000)   PRN Meds:.sodium chloride, albuterol, fentaNYL (SUBLIMAZE) injection, fentaNYL (SUBLIMAZE) injection, heparin, sodium  chloride   Pearson Grippe  MD 02/09/2017, 10:27 AM

## 2017-02-25 NOTE — Progress Notes (Signed)
NP Louretta Parma is aware of Critical Metabolic Acidosis ABG Results  Pt is unstable at this time. NP/RN/RRT at bedside.   [name], BS, RRT, RCP

## 2017-02-25 NOTE — Progress Notes (Signed)
Initial Nutrition Assessment  DOCUMENTATION CODES:   Obesity unspecified  INTERVENTION:   If unable to extubate within the next 24 hours, recommend start TF:  Vital High Protein at 35 ml/h (840 ml per day)  Pro-stat 30 ml QID  Provides 1240 kcal, 134 gm protein, 702 ml free water daily  NUTRITION DIAGNOSIS:   Inadequate oral intake related to inability to eat as evidenced by NPO status.  GOAL:   Provide needs based on ASPEN/SCCM guidelines  MONITOR:   Vent status, Labs, Weight trends, Skin, I & O's  REASON FOR ASSESSMENT:   Ventilator    ASSESSMENT:   71 yo female with PMH of HTN, HLD, obesity, GERD, anxiety, constipation, ESRD on HD, CAD, DM-2 who was admitted on 12/22 with encephalopathy, hypotension, severe metabolic acidosis, septic shock, requiring intubation on admission.  Patient is currently intubated on ventilator support Also receiving CRRT. MV: 18 L/min Temp (24hrs), Avg:95.5 F (35.3 C), Min:92.8 F (33.8 C), Max:97.7 F (36.5 C)  Labs reviewed. Sodium 127 (L), phosphorus 7.2 (H) CBG's: 490-427 Medications reviewed and include lactulose, sodium bicarb.  NUTRITION - FOCUSED PHYSICAL EXAM:    Most Recent Value  Orbital Region  No depletion  Upper Arm Region  Unable to assess  Thoracic and Lumbar Region  Unable to assess  Buccal Region  Unable to assess  Temple Region  No depletion  Clavicle Bone Region  No depletion  Clavicle and Acromion Bone Region  No depletion  Scapular Bone Region  Unable to assess  Dorsal Hand  Unable to assess  Patellar Region  No depletion  Anterior Thigh Region  No depletion  Posterior Calf Region  Unable to assess  Edema (RD Assessment)  Moderate  Hair  Reviewed  Eyes  Unable to assess  Mouth  Unable to assess  Skin  Reviewed  Nails  Unable to assess       Diet Order:  Diet NPO time specified  EDUCATION NEEDS:   No education needs have been identified at this time  Skin:  Skin Assessment: Skin  Integrity Issues: Skin Integrity Issues:: DTI, Stage II DTI: R & L heels Stage II: buttocks, leg, sacrum  Last BM:  unknown  Height:   Ht Readings from Last 1 Encounters:  03/06/2017 5\' 8"  (1.727 m)    Weight:   Wt Readings from Last 1 Encounters:  02/12/2017 215 lb 9.8 oz (97.8 kg)  200 lbs 02/22/2017 (BMI=30.4)  Ideal Body Weight:  63.6 kg  BMI:  Body mass index is 32.78 kg/m.  Estimated Nutritional Needs:   Kcal:  6295-2841  Protein:  130-145 gm  Fluid:  1.5-2 L   Molli Barrows, RD, LDN, CNSC Pager [phone] After Hours Pager [phone]

## 2017-02-25 NOTE — Procedures (Signed)
Central Venous Catheter Insertion Procedure Note [name] [id number] 02-20-1946  Procedure: Insertion of Central Venous Catheter Indications: Assessment of intravascular volume, Drug and/or fluid administration and Frequent blood sampling  Procedure Details Consent: Unable to obtain consent because of emergent medical necessity. Time Out: Verified patient identification, verified procedure, site/side was marked, verified correct patient position, special equipment/implants available, medications/allergies/relevent history reviewed, required imaging and test results available.  Performed  Maximum sterile technique was used including antiseptics, cap, gloves, gown, hand hygiene, mask and sheet. Skin prep: Chlorhexidine; local anesthetic administered A antimicrobial bonded/coated triple lumen catheter was placed in the right femoral vein due to emergent situation and INR >6 using the Seldinger technique.  Evaluation Blood flow good Complications: No apparent complications Patient did tolerate procedure well. Chest X-ray ordered to verify placement.  CXR: pending.  Hayden Pedro, AGACNP-BC [facility]  Pgr: [phone]  PCCM Pgr: [phone]

## 2017-02-25 NOTE — Progress Notes (Signed)
Received patient from critical care transport on Dopamine gtt, bipap. VS unstable hypotensive and bradycardic.  MD/NP aware of admission and to the bedside for line placement.  No personal belongings with patient.  Please see Epic for documentation.  Patient remains critical at this time, will monitor closely.

## 2017-02-25 NOTE — H&P (Signed)
PULMONARY / CRITICAL CARE MEDICINE   Name: [name] MRN: [id number] DOB: 03/29/1945    ADMISSION DATE:  03/01/2017 CONSULTATION DATE:  02/18/2017  REFERRING MD: Oval Linsey    CHIEF COMPLAINT:  AMS  HISTORY OF PRESENT ILLNESS:   71 year old female with PMH of ESRD on HD T/TH/Sat, HTN, CAD, GERD, DM 2, PAF (on Eliquis), Sacral Decubitus, Failure to thrive, combined systolic/diasolitc HF (EF 30-35, G2DD), Recently had left upper extremity AV graft placed on 12/30/2016.     Recently admitted 12/7-12/18 with weakness. INR supratherapeutic. Coumadin d/c started on eliquis  Amiodarone D/C due to elevated LFTs. Discharged to SNF.   Presented to [facility] on 12/21 with hypotension, hypoxia, and AMS. Glucose 30.  HR 76B, Systolic 34L. Started on Dopamine gtt. CXR with bilateral pleural effusions. ABG 7.23/26.6/117, LA 11.7. Per Family patient went to HD 12/20 however they were unable to pull fluid off due to hypotension.Transferred to [facility] for [facility] management.   Upon arrival to ICU patient on BiPAP however shortly after required intubation, CVC and A-line placement.   PAST MEDICAL HISTORY :  She  has a past medical history of Acute renal failure superimposed on stage 4 chronic kidney disease ([place]) (07/04/2015), Allergic rhinitis, Anemia, Anxiety, CAD in native artery, Carotid artery disease ([place]), Chronic combined systolic and diastolic CHF (congestive heart failure) ([place]), CKD (chronic kidney disease), stage IV ([place]), Constipation, Essential hypertension, GERD (gastroesophageal reflux disease), History of hysterectomy, Hyperlipidemia, Low back pain, Obesity, Occlusion of right subclavian artery, Osteoarthritis, Overactive bladder, PVC's (premature ventricular contractions), and Type 2 diabetes mellitus ([place]).  PAST SURGICAL HISTORY: She  has a past surgical history that includes Carpal tunnel release; Trigger finger release; Cervical biopsy; Back surgery; Coronary stent  placement (04/11/2006); Cardiac catheterization (04/04/2006); Abdominal hysterectomy; Colonoscopy (May 2002); Colonoscopy (2008); Cardiac catheterization (N/A, 07/04/2015); Cardiac catheterization (N/A, 07/04/2015); Cardiac catheterization (Right, 07/09/2015); Cardiac catheterization (Right, 07/10/2015); Cardiac catheterization (N/A, 10/13/2015); Cardiac catheterization (N/A, 12/02/2015); Tendonitis; AV fistula placement (Left, 12/30/2016); IR Fluoro Guide CV Line Right (01/07/2017); IR US Guide Vasc Access Right (01/07/2017); IR US Guide Vasc Access Right (01/16/2017); and IR Fluoro Guide CV Line Right (01/16/2017).  Allergies  Allergen Reactions  . Ace Inhibitors Cough  . Amlodipine Swelling    UNSPECIFIED EDEMA   . Codeine Rash    No current facility-administered medications on file prior to encounter.    Current Outpatient Medications on File Prior to Encounter  Medication Sig  . albuterol (PROVENTIL HFA;VENTOLIN HFA) 108 (90 Base) MCG/ACT inhaler Inhale 1-2 puffs into the lungs every 6 (six) hours as needed for wheezing or shortness of breath.  Marland Kitchen albuterol (PROVENTIL) (2.5 MG/3ML) 0.083% nebulizer solution Take 3 mLs (2.5 mg total) by nebulization every 6 (six) hours as needed for wheezing or shortness of breath.  . ALPRAZolam (XANAX) 0.5 MG tablet Take 1 tablet (0.5 mg total) by mouth at bedtime.  Marland Kitchen apixaban (ELIQUIS) 2.5 MG TABS tablet Take 1 tablet (2.5 mg total) by mouth 2 (two) times daily.  . calcitRIOL (ROCALTROL) 0.5 MCG capsule Take 1 capsule (0.5 mcg total) daily by mouth.  . calcium acetate (PHOSLO) 667 MG capsule Take 1 capsule (667 mg total) by mouth 3 (three) times daily with meals.  . cetirizine (ZYRTEC) 10 MG tablet Take 5 mg by mouth daily.  . clopidogrel (PLAVIX) 75 MG tablet Take 1 tablet (75 mg total) by mouth daily.  . cyclobenzaprine (FLEXERIL) 5 MG tablet Take 5 mg by mouth as needed (lower back pain).  osteoporosis with out current pathological fracture for 14 days  . ferrous  sulfate 325 (65 FE) MG tablet Take 325 mg by mouth daily with breakfast.   . fluticasone (FLONASE) 50 MCG/ACT nasal spray Place 1 spray into both nostrils daily.  . isosorbide mononitrate (IMDUR) 30 MG 24 hr tablet Take 1 tablet (30 mg total) 2 (two) times daily by mouth.  . latanoprost (XALATAN) 0.005 % ophthalmic solution Place 1 drop into both eyes at bedtime.   . meclizine (ANTIVERT) 25 MG tablet Take 12.5 mg by mouth 2 (two) times daily.   . nitroGLYCERIN (NITROSTAT) 0.4 MG SL tablet Place 1 tablet (0.4 mg total) under the tongue every 5 (five) minutes as needed for chest pain.  . pantoprazole (PROTONIX) 40 MG tablet Take 40 mg by mouth daily.     FAMILY HISTORY:  Her indicated that her mother is deceased. She indicated that the status of her father is unknown. She indicated that only one of her five brothers is alive. She indicated that the status of her neg hx is unknown.   SOCIAL HISTORY: She  reports that  has never smoked. she has never used smokeless tobacco. She reports that she does not drink alcohol or use drugs.  REVIEW OF SYSTEMS:   Unable to review as patient is encephalopathic   SUBJECTIVE:   VITAL SIGNS: BP (!) 87/45   Pulse (!) 49   Resp 10   SpO2 100%   HEMODYNAMICS:    VENTILATOR SETTINGS: FiO2 (%):  [60 %] 60 %  INTAKE / OUTPUT: No intake/output data recorded.  PHYSICAL EXAMINATION: General:  Chronically ill adult female, no distress  Neuro:  Lethargic, opens eyes to verbal stimulation, pupils intact and equal   HEENT:  BiPAP in place, dry MM  Cardiovascular:  Loletha Grayer, Frequent PVCs  Lungs:  Diminished to bases, tachpnea Abdomen:  Soft, Obese, active bowel sounds  Musculoskeletal:  +2 edema  Skin:  Cool, dry, sacral decub   LABS:  BMET Recent Labs  Lab 02/18/17 1237 02/20/17 0854 02/21/17 0848  NA 128* 130* 129*  K 5.1 4.5 5.3*  CL 91* 94* 91*  CO2 24 26 20*  BUN 63* 55* 70*  CREATININE 4.39* 3.78* 4.37*  GLUCOSE 193* 351* 414*     Electrolytes Recent Labs  Lab 02/18/17 1237 02/20/17 0854 02/21/17 0848  CALCIUM 8.8* 8.5* 8.7*  PHOS  --  5.1* 6.0*    CBC Recent Labs  Lab 02/18/17 1237 02/20/17 0854 02/21/17 0848  WBC 5.7 5.2 6.6  HGB 9.3* 8.9* 9.1*  HCT 31.1* 30.8* 31.3*  PLT 111* 96* 105*    Coag's Recent Labs  Lab 02/18/17 1237 02/20/17 0854  INR 3.74 1.84    Sepsis Markers No results for input(s): LATICACIDVEN, PROCALCITON, O2SATVEN in the last 168 hours.  ABG Recent Labs  Lab 02/20/17 0035 02/16/2017 0256  PHART 7.342* 7.163*  PCO2ART 48.5* 24.2*  PO2ART 177.0* 243.0*    Liver Enzymes Recent Labs  Lab 02/18/17 1237 02/20/17 0854 02/21/17 0848  AST 45* 26  --   ALT 47 33  --   ALKPHOS 209* 206*  --   BILITOT 0.8 0.6  --   ALBUMIN 2.3* 2.1* 2.2*    Cardiac Enzymes No results for input(s): TROPONINI, PROBNP in the last 168 hours.  Glucose Recent Labs  Lab 02/21/17 1053 02/21/17 1622 02/21/17 2142 02/22/17 0731 02/22/17 1134 03/01/2017 0242  GLUCAP 224* 117* 199* 275* 236* 80    Imaging No results found.  STUDIES:  CXR 12/22 >>  CULTURES: Blood 12/22 >>  Sputum 12/22 >>   ANTIBIOTICS: Vancomycin 12/22 >> Zosyn 12/22 >>  SIGNIFICANT EVENTS: 12/21 > Present to OSH   LINES/TUBES: ETT 12/22 >> Left Femoral Aline 12/22 >> Right Femoral Aline 12/22 >> Perm-Cath Right Subclavian >>   DISCUSSION: 71 year old female presents to OSH hypotensive, hypoxic, and bradycardiac. Started on dopamine gtt and transferred to [facility] for further management.   ASSESSMENT / PLAN:  PULMONARY A: Acute Hypoxic Respiratory Failure with bilateral pleural effusions  P:   Intubated now  Vent Support Trend ABG/CXR VAP Bundle   CARDIOVASCULAR A:  Bradycardia  Septic vs Cardiogenic Shock  Mixed Systolic/Diastolic HF in setting of Ischemic Cardiomyopathy  ECHO 11/11/16 EF 30-35, G2DD A.Fib (on eliquis)  P:  Cardiac Monitoring  Maintain MAP >65 (Currently on  Levophed, EPI, and Vasopressin)  ECHO pending  Trend Troponin  Hold Eliquis  Hold Statin due to elevated LFT   RENAL A:   Anion Gap Metabolic Acidosis in setting of Lactic Acidosis  LA 18.1 >  ESRD on HD T/TH/Sat P:   Consult Nephrology > ? CRRT  Trend BMP  Trend LA  Bicarb @ 125 ml/hr   GASTROINTESTINAL A:   Transaminases  P:   NPO  PPI  Trend LFT Trend Ammonia   HEMATOLOGIC A:   Supratherapeutic INR  INR 6.2 >  P:  Trend CBC  Trend INR   INFECTIOUS A:   Suspected Septic Shock Left Leg Decubitus ulcer, stage II sacral decubitis ulcer  H/O Recurrent UTI  P:   Trend WBC and Fever Curve  Trend LA and PCT  PAN Culture  Flu and RVP pending  Vancomycin and Zosyn  Wound Care consulted   ENDOCRINE A:   Hypoglycemia   H/O DM   P:   Trend glucose  D 10 @ 100 ml/hr   NEUROLOGIC A:   Metabolic vs Hepatic Encephalopathy  -Ammonia > 50 H/O Depression r/t to husbands recent passing  P:   RASS goal: 0/-1 PRN Fentanyl    FAMILY  - Updates: Family updated. Wish for patient to be full code. Understand severity.   - Inter-disciplinary family meet or Palliative Care meeting due by: 03/04/2017  CC Time: 53 minutes  Hayden Pedro, AGACNP-BC [facility]  Pgr: [phone]  PCCM Pgr: [phone]

## 2017-02-25 NOTE — Progress Notes (Signed)
  Echocardiogram 2D Echocardiogram has been performed.  Darlina Sicilian M 02/05/2017, 12:26 PM

## 2017-02-25 NOTE — Progress Notes (Signed)
Pharmacy Antibiotic Note  PHUONG HILLARY is a 71 y.o. female admitted on 02/19/2017 with sepsis.  Pharmacy has been consulted for Vancomycin/Zosyn dosing. Pt is a transfer from [facility] ED. ESRD on HD TTS.   Antibiotics given at [facility]:  12/21@2104 : Vancomycin 2000 mg IV x 1  12/21@2030 : Zosyn 3.375g IV x 1  Plan: -Vancomycin load given at [facility], now give 1000 mg IV qHD TTS -Zosyn 3.375G IV q12h to be infused over 4 hours -Trend WBC, temp, HD schedule  -F/U infectious work-up -Drug levels as indicated  No data recorded.  Recent Labs  Lab 02/18/17 1237 02/20/17 0854 02/21/17 0848 02/18/2017 0226  WBC 5.7 5.2 6.6 8.6  CREATININE 4.39* 3.78* 4.37* 3.55*  LATICACIDVEN  --   --   --  18.1*    Estimated Creatinine Clearance: 16.9 mL/min (A) (by C-G formula based on SCr of 3.55 mg/dL (H)).    Allergies  Allergen Reactions  . Ace Inhibitors Cough  . Amlodipine Swelling    UNSPECIFIED EDEMA   . Codeine Rash   Narda Bonds 02/28/2017 5:36 AM

## 2017-02-25 NOTE — Procedures (Signed)
Arterial Catheter Insertion Procedure Note [name] [id number] 1945-03-19  Procedure: Insertion of Arterial Catheter  Indications: Blood pressure monitoring  Procedure Details Consent: Unable to obtain consent because of emergent medical necessity. Time Out: Verified patient identification, verified procedure, site/side was marked, verified correct patient position, special equipment/implants available, medications/allergies/relevent history reviewed, required imaging and test results available.  Performed  Maximum sterile technique was used including antiseptics, cap, gloves, gown, hand hygiene and mask. Skin prep: Chlorhexidine; local anesthetic administered 20 gauge catheter was inserted into left femoral artery using the Seldinger technique.  Evaluation Blood flow good; BP tracing good. Complications: No apparent complications.   Charlesetta Garibaldi 03/01/2017

## 2017-02-25 NOTE — Procedures (Signed)
Procedure Name: Intubation Date/Time: 02/27/2017 6:48 AM Performed by: Charlesetta Garibaldi, MD Pre-anesthesia Checklist: Patient identified, Emergency Drugs available, Suction available, Timeout performed and Patient being monitored Preoxygenation: Pre-oxygenation with 100% oxygen Induction Type: IV induction Ventilation: Mask ventilation without difficulty and Oral airway inserted - appropriate to patient size Laryngoscope Size: Glidescope and 3 Grade View: Grade I Tube type: Subglottic suction tube Tube size: 7.5 mm Number of attempts: 1 Airway Equipment and Method: Video-laryngoscopy and Rigid stylet Placement Confirmation: ETT inserted through vocal cords under direct vision,  Positive ETCO2 and Breath sounds checked- equal and bilateral

## 2017-02-25 NOTE — Consult Note (Signed)
[name] Admit Date: 03/04/2017 02/07/2017 Rexene Agent Requesting Physician:  Derrek Gu MD  Reason for Consult:  ESRD, Metabolic Acidosis, Septic Shock HPI:  42F admitted overnight to [facility] hospital from an outside facility when she presented with septic shock, respiratory failure.    PMH Incudes:  Recent initiation of ESRD, last month, receiving treatment in [place] on THS schedule, using right IJ tunneled dialysis catheter,  History of CHF, mixed systolic and diastolic  CAD  Atrial fibrillation, previously on warfarin, it appears was transitioned to apixaban  DM 2  Patient was admitted to [facility] from 12/7 to 12/18, with supratherapeutic INR, failure to thrive, decubitus ulcer of the left leg and sacrum.  She was discharged to skilled nursing facility.  By report the patient went to dialysis on 12/20 but had difficulty with ultrafiltration and hypertension.  She then presented to the [facility] hospital last night.  She has been intubated for respiratory failure with imaging demonstrating bilateral effusions.  She has developed septic shock requiring norepinephrine, epinephrine, dopamine.  She has a severe increased anion gap metabolic acidosis with a lactate of 18.  Labs are notable for potassium of 4.7, serum bicarbonate of 9, ABG pH 7.13, PaCO2 19, PO2 91, hemoglobin 12.2, INR greater than 10.  I met with 3 of the patient's children, reviewing her progressive debility over the past several months, now with decubitus ulcers and in a nursing facility with severe septic shock, respiratory failure, and nearly overwhelming acidosis.  I expressed concerns about how likely she is to survive this.  We explored whether or not to pursue CRRT.  Generally, they wish to try it.  I clearly expressed that if she does not tolerate it, especially as it relates to hemodynamics, it should be discontinued.   Creat (mg/dL)  Date Value  10/08/2013 1.73 (H)   09/23/2013 2.23 (H)   Creatinine, Ser (mg/dL)  Date Value  02/13/2017 3.55 (H)  02/21/2017 4.37 (H)  02/20/2017 3.78 (H)  02/18/2017 4.39 (H)  02/17/2017 3.59 (H)  02/16/2017 4.70 (H)  02/15/2017 3.72 (H)  02/14/2017 5.11 (H)  02/12/2017 3.89 (H)  02/11/2017 4.83 (H)  ]  ROS Unable to complete review of systems given patient intubated and sedated  PMH  Past Medical History:  Diagnosis Date  . Acute renal failure superimposed on stage 4 chronic kidney disease ([place]) 07/04/2015  . Allergic rhinitis   . Anemia   . Anxiety   . CAD in native artery    NSTEMI 07/2015 s/p DES to RCA and posterior PDA, PCI 10/2015 with scoring balloon to 85% ISR of distal RCA), known LAD/Cx disease treated medically  . Carotid artery disease ([place])    Mild bilateral carotid disease (1-39% 07/2015)  . Chronic combined systolic and diastolic CHF (congestive heart failure) ([place])   . CKD (chronic kidney disease), stage IV ([place])   . Constipation   . Essential hypertension   . GERD (gastroesophageal reflux disease)   . History of hysterectomy   . Hyperlipidemia   . Low back pain   . Obesity   . 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 Osteoarthritis   . Overactive bladder   . PVC's (premature ventricular contractions)   . Type 2 diabetes mellitus ([facility])    PSH  Past Surgical History:  Procedure Laterality Date  . ABDOMINAL HYSTERECTOMY    . AV FISTULA PLACEMENT Left 12/30/2016   Procedure: insertion of  left upper arm gortex GRAFT;  Surgeon: Angelia Mould, MD;  Location: [facility] OR;  Service: Vascular;  Laterality: Left;  . BACK SURGERY     multiple  . CARDIAC CATHETERIZATION  04/04/2006   Est EF of 60%  . CARDIAC CATHETERIZATION N/A 07/04/2015   Procedure: Left Heart Cath and Coronary Angiography;  Surgeon: Troy Sine, MD;  Location: [place] CV LAB;  Service: Cardiovascular;  Laterality: N/A;  . CARDIAC  CATHETERIZATION N/A 07/04/2015   Procedure: Coronary Stent Intervention;  Surgeon: Troy Sine, MD;  Location: [place] CV LAB;  Service: Cardiovascular;  Laterality: N/A;  . CARDIAC CATHETERIZATION N/A 10/13/2015   Procedure: Left Heart Cath and Coronary Angiography;  Surgeon: Troy Sine, MD;  Location: [place] CV LAB;  Service: Cardiovascular;  Laterality: N/A;  . CARDIAC CATHETERIZATION N/A 12/02/2015   Procedure: Left Heart Cath and Coronary Angiography;  Surgeon: Peter M Martinique, MD;  Location: [place] CV LAB;  Service: Cardiovascular;  Laterality: N/A;  . CARPAL TUNNEL RELEASE    . CERVICAL BIOPSY     cervical lymph node biopsies  . COLONOSCOPY  May 2002   Dr. Irving Shows :Followup in 5 years, normal exam  . COLONOSCOPY  2008   Dr. Laural Golden: Very redundant colon with mild melanosis coli, splenic flexure polyp biopsy with acute complaint of benign colon polyp. Recommended ten-year followup  . CORONARY STENT PLACEMENT  04/11/2006   2 -- Taxus stents to the circumflex   . IR FLUORO GUIDE CV LINE RIGHT  01/07/2017  . IR FLUORO GUIDE CV LINE RIGHT  01/16/2017  . IR US GUIDE VASC ACCESS RIGHT  01/07/2017  . IR US GUIDE VASC ACCESS RIGHT  01/16/2017  . PERIPHERAL VASCULAR CATHETERIZATION Right 07/09/2015   Procedure: Upper Extremity Angiography;  Surgeon: Conrad Lake Carmel, MD;  Location: [place] CV LAB;  Service: Cardiovascular;  Laterality: Right;  . PERIPHERAL VASCULAR CATHETERIZATION Right 07/10/2015   Procedure: RIGHT SUBCLAVIAN ARTERY THROMBECTOMY;  Surgeon: Serafina Mitchell, MD;  Location: [facility] OR;  Service: Vascular;  Laterality: Right;  . Tendonitis     bilateral elbow  . TRIGGER FINGER RELEASE     FH  Family History  Problem Relation Age of Onset  . Diabetes Father   . Hypertension Father   . Stroke Father   . Hypertension Brother   . Aneurysm Brother   . Diabetes Brother   . Colon cancer Neg Hx    SH  reports that  has never smoked. she has never used smokeless  tobacco. She reports that she does not drink alcohol or use drugs. Allergies  Allergies  Allergen Reactions  . Ace Inhibitors Cough  . Amlodipine Swelling    UNSPECIFIED EDEMA   . Codeine Rash   Home medications Prior to Admission medications   Medication Sig Start Date End Date Taking? Authorizing Provider  albuterol (PROVENTIL HFA;VENTOLIN HFA) 108 (90 Base) MCG/ACT inhaler Inhale 1-2 puffs into the lungs every 6 (six) hours as needed for wheezing or shortness of breath. 04/01/16   Varney Biles, MD  albuterol (PROVENTIL) (2.5 MG/3ML) 0.083% nebulizer solution Take 3 mLs (2.5 mg total) by nebulization every 6 (six) hours as needed for wheezing or shortness of breath. 07/16/16   Elgergawy, Silver Huguenin, MD  ALPRAZolam Duanne Moron) 0.5 MG tablet Take 1 tablet (0.5 mg total) by mouth at bedtime. 02/21/17   Isaac Bliss, Rayford Halsted, MD  apixaban (ELIQUIS) 2.5 MG TABS tablet Take 1 tablet (2.5 mg total) by  mouth 2 (two) times daily. 02/21/17   Isaac Bliss, Rayford Halsted, MD  calcitRIOL (ROCALTROL) 0.5 MCG capsule Take 1 capsule (0.5 mcg total) daily by mouth. 01/18/17   Regalado, Belkys A, MD  calcium acetate (PHOSLO) 667 MG capsule Take 1 capsule (667 mg total) by mouth 3 (three) times daily with meals. 02/22/17   Isaac Bliss, Rayford Halsted, MD  cetirizine (ZYRTEC) 10 MG tablet Take 5 mg by mouth daily. 10/24/16   [provider]  clopidogrel (PLAVIX) 75 MG tablet Take 1 tablet (75 mg total) by mouth daily. 12/13/16   Georgette Shell, MD  cyclobenzaprine (FLEXERIL) 5 MG tablet Take 5 mg by mouth as needed (lower back pain). osteoporosis with out current pathological fracture for 14 days 10/03/16   [provider]  ferrous sulfate 325 (65 FE) MG tablet Take 325 mg by mouth daily with breakfast.     [provider]  fluticasone (FLONASE) 50 MCG/ACT nasal spray Place 1 spray into both nostrils daily.    [provider]  isosorbide mononitrate (IMDUR) 30 MG 24 hr  tablet Take 1 tablet (30 mg total) 2 (two) times daily by mouth. 01/17/17   Regalado, Belkys A, MD  latanoprost (XALATAN) 0.005 % ophthalmic solution Place 1 drop into both eyes at bedtime.     [provider]  meclizine (ANTIVERT) 25 MG tablet Take 12.5 mg by mouth 2 (two) times daily.     [provider]  nitroGLYCERIN (NITROSTAT) 0.4 MG SL tablet Place 1 tablet (0.4 mg total) under the tongue every 5 (five) minutes as needed for chest pain. 10/14/15   Lyda Jester M, PA-C  pantoprazole (PROTONIX) 40 MG tablet Take 40 mg by mouth daily.     [provider]    Current Medications Scheduled Meds: . atropine      . EPINEPHrine      . fludrocortisone  0.05 mg Oral Q24H  . hydrocortisone sod succinate (SOLU-CORTEF) inj  50 mg Intravenous Q6H  . lactulose  20 g Per Tube Q6H  . pantoprazole (PROTONIX) IV  40 mg Intravenous Q24H   Continuous Infusions: . sodium chloride    . dextrose Stopped (02/12/2017 4076)  . DOPamine    . epinephrine 20 mcg/min (02/24/2017 0510)  . norepinephrine (LEVOPHED) Adult infusion    . piperacillin-tazobactam (ZOSYN)  IV    . dialysis replacement fluid (prismasate)    . dialysis replacement fluid (prismasate)    . dialysate (PRISMASATE)    .  sodium bicarbonate  infusion 1000 mL 125 mL/hr at 02/22/2017 0507  . sodium chloride    . vancomycin    . vasopressin (PITRESSIN) infusion - *FOR SHOCK* 0.03 Units/min (02/15/2017 0500)   PRN Meds:.sodium chloride, albuterol, fentaNYL (SUBLIMAZE) injection, fentaNYL (SUBLIMAZE) injection, heparin, sodium chloride  CBC Recent Labs  Lab 02/20/17 0854 02/21/17 0848 02/21/2017 0226 03/01/2017 0535  WBC 5.2 6.6 8.6  --   HGB 8.9* 9.1* 9.7* 12.2  HCT 30.8* 31.3* 33.7* 36.0  MCV 95.4 94.3 96.8  --   PLT 96* 105* 121*  --    Basic Metabolic Panel Recent Labs  Lab 02/18/17 1237 02/20/17 0854 02/21/17 0848 02/19/2017 0226 02/19/2017 0535  NA 128* 130* 129* 132* 127*  K 5.1 4.5 5.3* 4.7 4.2  CL  91* 94* 91* 90*  --   CO2 24 26 20* 9*  --   GLUCOSE 193* 351* 414* 415*  --   BUN 63* 55* 70* 42*  --   CREATININE  4.39* 3.78* 4.37* 3.55*  --   CALCIUM 8.8* 8.5* 8.7* 8.6*  --   PHOS  --  5.1* 6.0* 7.2*  --     Physical Exam  Blood pressure (!) 87/45, pulse (!) 49, resp. rate 10, weight 97.8 kg (215 lb 9.8 oz), SpO2 96 %. GEN: intubated, not responsive ENT: ETT in place, oral EYES: eyes closed CV: RRR, nl s1s2 PULM: coarse bs b/l ABD: s/nt/nd SKIN: R IJ TDC C/D/I, banaged, no erythema or purulence TUY:WXIPP LEE   Assessment 71FM ESRD with septic shock, VDRF, severe lactic acidosis  1. ESRD [facility] THS via TDC, maturing AVG 2. Septic Shock, req 3 pressors, source unclear 3. VDRF, hypoxic 4. AG metabolic acidosis, lacate elevated 5. Supratherapeutic INR, > 10 6. Failure to Thrive, recent 10d admission, now in SNF< with decubitus ulcers 7. S/d CHF 8. DM2  Plan 1. Trial of CRRT, all 4K bath, 750/1500/500 pre/dialysate/post, net event to 71m net positive; stop NaHCO3 gtt once acidosis improves; use TDC 2. If hemodynamics worsen or do not permit CRRT, stop therapy 3. No systemic or circuit heparin given elevated INR  RPearson GrippeMD [phone] 02/13/2017, 7:17 AM

## 2017-02-26 DIAGNOSIS — G9340 Encephalopathy, unspecified: Secondary | ICD-10-CM

## 2017-02-26 LAB — CBC
HEMATOCRIT: 33 % — AB (ref 36.0–46.0)
HEMOGLOBIN: 10.1 g/dL — AB (ref 12.0–15.0)
MCH: 28.1 pg (ref 26.0–34.0)
MCHC: 30.6 g/dL (ref 30.0–36.0)
MCV: 91.7 fL (ref 78.0–100.0)
Platelets: 52 10*3/uL — ABNORMAL LOW (ref 150–400)
RBC: 3.6 MIL/uL — ABNORMAL LOW (ref 3.87–5.11)
RDW: 21.6 % — ABNORMAL HIGH (ref 11.5–15.5)
WBC: 6.6 10*3/uL (ref 4.0–10.5)

## 2017-02-26 LAB — RENAL FUNCTION PANEL
ANION GAP: 19 — AB (ref 5–15)
Albumin: 1.8 g/dL — ABNORMAL LOW (ref 3.5–5.0)
Albumin: 1.8 g/dL — ABNORMAL LOW (ref 3.5–5.0)
Anion gap: 19 — ABNORMAL HIGH (ref 5–15)
BUN: 19 mg/dL (ref 6–20)
BUN: 11 mg/dL (ref 6–20)
CHLORIDE: 97 mmol/L — AB (ref 101–111)
CHLORIDE: 98 mmol/L — AB (ref 101–111)
CO2: 15 mmol/L — ABNORMAL LOW (ref 22–32)
CO2: 14 mmol/L — AB (ref 22–32)
CREATININE: 1.72 mg/dL — AB (ref 0.44–1.00)
CREATININE: 1.06 mg/dL — AB (ref 0.44–1.00)
Calcium: 7.3 mg/dL — ABNORMAL LOW (ref 8.9–10.3)
Calcium: 6.9 mg/dL — ABNORMAL LOW (ref 8.9–10.3)
GFR calc Af Amer: 33 mL/min — ABNORMAL LOW (ref >60)
GFR, EST AFRICAN AMERICAN: 59 mL/min — AB (ref >60)
GFR, EST NON AFRICAN AMERICAN: 29 mL/min — AB (ref >60)
GFR, EST NON AFRICAN AMERICAN: 51 mL/min — AB (ref >60)
GLUCOSE: 220 mg/dL — AB (ref 65–99)
Glucose, Bld: 85 mg/dL (ref 65–99)
POTASSIUM: 3.6 mmol/L (ref 3.5–5.1)
POTASSIUM: 3.9 mmol/L (ref 3.5–5.1)
Phosphorus: 2.2 mg/dL — ABNORMAL LOW (ref 2.5–4.6)
Phosphorus: 1.9 mg/dL — ABNORMAL LOW (ref 2.5–4.6)
Sodium: 131 mmol/L — ABNORMAL LOW (ref 135–145)
Sodium: 131 mmol/L — ABNORMAL LOW (ref 135–145)

## 2017-02-26 LAB — CG4 I-STAT (LACTIC ACID): LACTIC ACID, VENOUS: 15 mmol/L — AB (ref 0.5–1.9)

## 2017-02-26 LAB — MAGNESIUM: Magnesium: 2 mg/dL (ref 1.7–2.4)

## 2017-02-26 LAB — GLUCOSE, CAPILLARY
GLUCOSE-CAPILLARY: 107 mg/dL — AB (ref 65–99)
Glucose-Capillary: 134 mg/dL — ABNORMAL HIGH (ref 65–99)
Glucose-Capillary: 194 mg/dL — ABNORMAL HIGH (ref 65–99)
Glucose-Capillary: 258 mg/dL — ABNORMAL HIGH (ref 65–99)
Glucose-Capillary: 79 mg/dL (ref 65–99)

## 2017-02-26 LAB — PROCALCITONIN: PROCALCITONIN: 5.41 ng/mL

## 2017-02-26 MED ORDER — STERILE WATER FOR INJECTION IV SOLN
INTRAVENOUS | Status: DC
  Administered 2017-02-26: 14:00:00 via INTRAVENOUS
  Filled 2017-02-26 (×4): qty 850

## 2017-02-27 LAB — TYPE AND SCREEN
ABO/RH(D): B POS
ANTIBODY SCREEN: NEGATIVE
UNIT DIVISION: 0
Unit division: 0

## 2017-02-27 LAB — BPAM RBC
BLOOD PRODUCT EXPIRATION DATE: 201812272359
Blood Product Expiration Date: 201901092359
UNIT TYPE AND RH: 7300
UNIT TYPE AND RH: 7300

## 2017-03-01 LAB — GLUCOSE, CAPILLARY: GLUCOSE-CAPILLARY: 469 mg/dL — AB (ref 65–99)

## 2017-03-02 LAB — CULTURE, BLOOD (ROUTINE X 2)
CULTURE: NO GROWTH
CULTURE: NO GROWTH
SPECIAL REQUESTS: ADEQUATE
Special Requests: ADEQUATE

## 2017-03-07 NOTE — Progress Notes (Signed)
Nursing note: 1708 all infusions stopped as per family request.  1718 patient asystole on monitor with absent heart tones, pupillary response, and pulse.  Dr. Vaughan Browner advised of asystole and VS - pronounced time of death 51.  Respiratory called to removed ventilator from room.  No narcotic or controlled substances to waste.

## 2017-03-07 NOTE — Procedures (Signed)
1718 time of death. Pt extubated and vent removed from room.

## 2017-03-07 NOTE — Progress Notes (Signed)
Despite maximal support with 4 vasopressors (levophed, vasopressin, dopamine, and epinepherine) this patient continues to be hypotensive and hypothermic with core body temperature at 93 degrees.  CCM and Nephrology aware.  Family called and on the way.  Will continue to monitor and titrate medications within protocol.

## 2017-03-07 NOTE — Progress Notes (Signed)
   March 25, 2017 1632  Clinical Encounter Type  Visited With Patient;Health care provider  Visit Type Critical Care  Referral From Nurse  Consult/Referral To Chaplain   Responded to a page to 2M06.  Patient had been made DNR and her health was failing. Walking down the hall I was met by the patient's nurse.  She indicated what was going on with the patient, her body is shutting down.  Family was not present at the time, but in the hospital.  Nurse informed me that the patient's husband died at Thanksgiving so needless to say the family is in grief and disbelief.  The family is uncertain if they want a chaplain present or not.  I prayed for the patient and let the nurse know what every I could do to support the family to let me know. Chaplain Katherene Ponto

## 2017-03-07 NOTE — Consult Note (Addendum)
[place] Nurse wound consult note Pt is familiar to [facility] team from a recent admission; refer to progress notes on 12/11.  Pt is critically ill and on CVVHD, vent, and pressors.  Bilat feet and toes are blackish-purple, cold, and mottled.  It will be difficult to promote wound healing related to multiple systemic factors. Pt has multiple wounds which have declined since previous assessment. Sacrum: Pt is too unstable to turn at this time, according to the bedside nurse.  She states she has assessed the sacrum and bilat buttocks and gluteal fold; there are some stage 2 pressure injuries;  the affected areas are approx 3X3X.2cm, red and moist.  Silicone foam dressing in place.  Left heel with unstageable pressure injury; 4X3 cm 100% dry tightly attached eschar without odor, drainage, or fluctuance. Right heel with deep tissue injury; 1X1cm, darker colored skin Left lower leg with full thickness wound; 2X3X.2cm, red and moist, mod amt yellow drainage, 90% red, 10% yellow Left posterior leg 5X4cm full thickness, 20% yellow, 80% red, mod amt yellow drainage, no odor  Pressure Injury POA: Yes Plan: Pt is on a low-airloss bed to reduce pressure.  Float heels in Prevalon boots to reduce pressure.  Foam dressings to legs, sacrum, and heels.  It is best practice to leave dry stable eschar in place to left heel.  No family present to discuss plan of care. Please re-consult if further assistance is needed.  Thank-you,  Julien Girt MSN, [place], [place], [place], [place]

## 2017-03-07 NOTE — Progress Notes (Signed)
PULMONARY / CRITICAL CARE MEDICINE   Name: [name] MRN: [id number] DOB: 01/03/1946    ADMISSION DATE:  02/16/2017 CONSULTATION DATE:  02/16/2017  REFERRING MD: Oval Linsey    CHIEF COMPLAINT:  AMS  HISTORY OF PRESENT ILLNESS:   72 year old female with PMH of ESRD on HD T/TH/Sat, HTN, CAD, GERD, DM 2, PAF (on Eliquis), Sacral Decubitus, Failure to thrive, combined systolic/diasolitc HF (EF 30-35, G2DD), Recently had left upper extremity AV graft placed on 12/30/2016.     Recently admitted 12/7-12/18 with weakness. INR supratherapeutic. Coumadin d/c started on eliquis  Amiodarone D/C due to elevated LFTs. Discharged to SNF.   Presented to [facility] on 12/21 with hypotension, hypoxia, and AMS. Glucose 30.  HR 01S, Systolic 01U. Started on Dopamine gtt. CXR with bilateral pleural effusions. ABG 7.23/26.6/117, LA 11.7. Per Family patient went to HD 12/20 however they were unable to pull fluid off due to hypotension.Transferred to [facility] for [facility] management.   Upon arrival to ICU patient on BiPAP however shortly after required intubation, CVC and A-line placement.   PAST MEDICAL HISTORY :  She  has a past medical history of Acute renal failure superimposed on stage 4 chronic kidney disease ([place]) (07/04/2015), Allergic rhinitis, Anemia, Anxiety, CAD in native artery, Carotid artery disease ([place]), Chronic combined systolic and diastolic CHF (congestive heart failure) ([place]), CKD (chronic kidney disease), stage IV ([place]), Constipation, Essential hypertension, GERD (gastroesophageal reflux disease), History of hysterectomy, Hyperlipidemia, Low back pain, Obesity, Occlusion of right subclavian artery, Osteoarthritis, Overactive bladder, PVC's (premature ventricular contractions), and Type 2 diabetes mellitus ([place]).  PAST SURGICAL HISTORY: She  has a past surgical history that includes Carpal tunnel release; Trigger finger release; Cervical biopsy; Back surgery; Coronary stent  placement (04/11/2006); Cardiac catheterization (04/04/2006); Abdominal hysterectomy; Colonoscopy (May 2002); Colonoscopy (2008); Cardiac catheterization (N/A, 07/04/2015); Cardiac catheterization (N/A, 07/04/2015); Cardiac catheterization (Right, 07/09/2015); Cardiac catheterization (Right, 07/10/2015); Cardiac catheterization (N/A, 10/13/2015); Cardiac catheterization (N/A, 12/02/2015); Tendonitis; AV fistula placement (Left, 12/30/2016); IR Fluoro Guide CV Line Right (01/07/2017); IR US Guide Vasc Access Right (01/07/2017); IR US Guide Vasc Access Right (01/16/2017); and IR Fluoro Guide CV Line Right (01/16/2017).  Allergies  Allergen Reactions  . Ace Inhibitors Cough  . Amlodipine Swelling    Became swollen "all over"   . Codeine Rash    No current facility-administered medications on file prior to encounter.    Current Outpatient Medications on File Prior to Encounter  Medication Sig  . albuterol (PROVENTIL HFA;VENTOLIN HFA) 108 (90 Base) MCG/ACT inhaler Inhale 1-2 puffs into the lungs every 6 (six) hours as needed for wheezing or shortness of breath.  Marland Kitchen albuterol (PROVENTIL) (2.5 MG/3ML) 0.083% nebulizer solution Take 3 mLs (2.5 mg total) by nebulization every 6 (six) hours as needed for wheezing or shortness of breath.  . ALPRAZolam (XANAX) 0.5 MG tablet Take 1 tablet (0.5 mg total) by mouth at bedtime.  Marland Kitchen apixaban (ELIQUIS) 2.5 MG TABS tablet Take 1 tablet (2.5 mg total) by mouth 2 (two) times daily.  . calcitRIOL (ROCALTROL) 0.5 MCG capsule Take 1 capsule (0.5 mcg total) daily by mouth.  . calcium acetate (PHOSLO) 667 MG capsule Take 1 capsule (667 mg total) by mouth 3 (three) times daily with meals.  . cetirizine (ZYRTEC) 10 MG tablet Take 5 mg by mouth daily.  . clopidogrel (PLAVIX) 75 MG tablet Take 1 tablet (75 mg total) by mouth daily.  . cyclobenzaprine (FLEXERIL) 5 MG tablet Take 5 mg by mouth as needed (lower  back pain). osteoporosis with out current pathological fracture for 14 days  .  ferrous sulfate 325 (65 FE) MG tablet Take 325 mg by mouth daily with breakfast.   . fluticasone (FLONASE) 50 MCG/ACT nasal spray Place 1 spray into both nostrils daily.  . isosorbide mononitrate (IMDUR) 30 MG 24 hr tablet Take 1 tablet (30 mg total) 2 (two) times daily by mouth.  . latanoprost (XALATAN) 0.005 % ophthalmic solution Place 1 drop into both eyes at bedtime.   . meclizine (ANTIVERT) 25 MG tablet Take 12.5 mg by mouth 2 (two) times daily.   . nitroGLYCERIN (NITROSTAT) 0.4 MG SL tablet Place 1 tablet (0.4 mg total) under the tongue every 5 (five) minutes as needed for chest pain.  . pantoprazole (PROTONIX) 40 MG tablet Take 40 mg by mouth daily.     FAMILY HISTORY:  Her indicated that her mother is deceased. She indicated that the status of her father is unknown. She indicated that only one of her five brothers is alive. She indicated that the status of her neg hx is unknown.   SOCIAL HISTORY: She  reports that  has never smoked. she has never used smokeless tobacco. She reports that she does not drink alcohol or use drugs.  REVIEW OF SYSTEMS:   Unable to review as patient is encephalopathic   SUBJECTIVE:  Remains on CRRT and 4 pressors.  No change in events overnight.  VITAL SIGNS: BP (!) 111/41   Pulse 68   Temp (!) 96.6 F (35.9 C) (Axillary) Comment: nurse dede notified   Resp (!) 35   Ht 5\' 8"  (1.727 m)   Wt 219 lb 9.3 oz (99.6 kg)   SpO2 100%   BMI 33.39 kg/m   HEMODYNAMICS:    VENTILATOR SETTINGS: Vent Mode: PRVC FiO2 (%):  [40 %-50 %] 40 % Set Rate:  [35 bmp] 35 bmp Vt Set:  [500 mL] 500 mL PEEP:  [5 cmH20] 5 cmH20 Plateau Pressure:  [26 cmH20-35 cmH20] 29 cmH20  INTAKE / OUTPUT: I/O last 3 completed shifts: In: 7482.6 [I.V.:6977.6; Other:60; NG/GT:45; IV Piggyback:400] Out: 5383 [Urine:85; Other:5298]  PHYSICAL EXAMINATION: Gen:      No acute distress, chronically ill appearing HEENT:  EOMI, sclera anicteric, ETT tube  Neck:     No masses; no  thyromegaly Lungs:    Clear to auscultation bilaterally; normal respiratory effort CV:         Regular rate and rhythm; no murmurs Abd:      + bowel sounds; soft, non-tender; no palpable masses, no distension Ext:    1-2+ edema; necrotic toes Skin:      Warm and dry; no rash Neuro: Sedated, unresponsive  LABS:  BMET Recent Labs  Lab 02/07/2017 0226 02/09/2017 0535 02/21/2017 1638 March 26, 2017 0353  NA 132* 127* 130* 131*  K 4.7 4.2 3.5 3.6  CL 90*  --  93* 97*  CO2 9*  --  16* 15*  BUN 42*  --  29* 19  CREATININE 3.55*  --  2.34* 1.72*  GLUCOSE 415*  --  406* 220*    Electrolytes Recent Labs  Lab 02/10/2017 0226 02/09/2017 1638 2017-03-26 0353  CALCIUM 8.6* 7.5* 7.3*  MG 2.2  --  2.0  PHOS 7.2* 3.0 2.2*    CBC Recent Labs  Lab 02/21/17 0848 02/07/2017 0226 02/13/2017 0535 02/10/2017 1638 03-26-17 0746  WBC 6.6 8.6  --   --  6.6  HGB 9.1* 9.7* 12.2  --  10.1*  HCT  31.3* 33.7* 36.0  --  33.0*  PLT 105* 121*  --  90* 52*    Coag's Recent Labs  Lab 02/20/17 0854 02/17/2017 0441 02/27/2017 1638  APTT  --   --  58*  INR 1.84 >10.00* >10.00*    Sepsis Markers Recent Labs  Lab 02/05/2017 0226 02/08/2017 1638 Mar 15, 2017 0353 2017/03/15 0758  LATICACIDVEN 18.1* 13.6*  --  15.00*  PROCALCITON 1.47  --  5.41  --     ABG Recent Labs  Lab 02/20/17 0035 02/07/2017 0256 02/04/2017 0535  PHART 7.342* 7.163* 7.139*  PCO2ART 48.5* 24.2* 19.9*  PO2ART 177.0* 243.0* 91.0    Liver Enzymes Recent Labs  Lab 02/20/17 0854  03/04/2017 0226 02/15/2017 1638 15-Mar-2017 0353  AST 26  --  88*  --   --   ALT 33  --  41  --   --   ALKPHOS 206*  --  237*  --   --   BILITOT 0.6  --  1.8*  --   --   ALBUMIN 2.1*   < > 2.2* 2.1* 1.8*   < > = values in this interval not displayed.    Cardiac Enzymes Recent Labs  Lab 02/18/2017 0226  TROPONINI 0.77*    Glucose Recent Labs  Lab 02/06/2017 1215 02/09/2017 1612 02/06/2017 2011 March 15, 2017 0012 03-15-17 0403 March 15, 2017 0751  GLUCAP 427* 409* 341* 258*  194* 134*    Imaging No results found.  STUDIES:  Echo 4/22> EF 20-25%, restrictive physiology.  PA peak pressure of 54.  CULTURES: Blood 12/22 >>  Sputum 12/22 >>   ANTIBIOTICS: Vancomycin 12/22 >> Zosyn 12/22 >>  SIGNIFICANT EVENTS: 12/21 > Present to OSH   LINES/TUBES: ETT 12/22 >> Left Femoral Aline 12/22 >> Right Femoral Aline 12/22 >> Perm-Cath Right Subclavian >>   DISCUSSION: 72 year old female presents to OSH hypotensive, hypoxic, and bradycardiac.  She is multiorgan failure with maxed out pressor support Worsening status 12/23 with rising lactic acid.  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  ECHO 11/11/16 EF 30-35, G2DD A.Fib (on eliquis)  P:  Cardiac Monitoring  Maintain MAP >65 (Currently on Levophed, EPI, and Vasopressin)  Hold Eliquis  Hold Statin due to elevated LFT   RENAL A:   Anion Gap Metabolic Acidosis in setting of Lactic Acidosis  LA 18.1 >  ESRD on HD T/TH/Sat P:   On CRRT  GASTROINTESTINAL A:   Transaminases  P:   NPO  PPI  Trend LFT Trend Ammonia   HEMATOLOGIC A:   Supratherapeutic INR  INR 6.2 >  P:  Trend CBC  Trend INR   INFECTIOUS A:   Suspected Septic Shock Left Leg Decubitus ulcer, stage II sacral decubitis ulcer  H/O Recurrent UTI  P:   Trend WBC and Fever Curve  Trend LA and PCT  Vancomycin and Zosyn  Wound Care consulted   ENDOCRINE A:   Hypoglycemia   H/O DM   P:   Trend glucose   NEUROLOGIC A:   Metabolic vs Hepatic Encephalopathy  -Ammonia > 50 H/O Depression r/t to husbands recent passing  P:   RASS goal: 0/-1 PRN Fentanyl    FAMILY  - Updates: Family updated. Wish for patient to be full code. Understand severity.  Poor prognosis  as she is maxed  out on all medical interventions. Will meet with the family again today 12/23.  - Inter-disciplinary family meet or Palliative Care meeting due by: 03/04/2017  The patient is critically ill with multiple organ system failure and requires high complexity decision making for assessment and support, frequent evaluation and titration of therapies, advanced monitoring, review of radiographic studies and interpretation of complex data.   Critical Care Time devoted to patient care services, exclusive of separately billable procedures, described in this note is 40 minutes.   Marshell Garfinkel MD [facility] Pager [phone] If no answer or after 3pm call: [phone] 03/24/2017, 9:43 AM

## 2017-03-07 NOTE — Procedures (Signed)
Admit: 02/24/2017 LOS: 1  79F ESRD with septic shock, severe lactic acidosis, VDRF  Current CRRT Prescription: Start Date: 02/10/2017 Catheter: R IJ Tunneled HD Cath BFR: 200 Pre Blood Pump: 1000 4K DFR: 1500 4K Replacement Rate: 500 4K Goal UF: net even Anticoagulation: none Clotting: once in 24h INR still > 10 Vanc/Zosyn  S: Still on 4 pressors, but dosing somewhat improved 40% FIO2 Culture data negative LVEF 20-25%  O: 12/22 0701 - 12/23 0700 In: 6636.3 [I.V.:6131.3; NG/GT:45; IV Piggyback:400] Out: 5373 [Urine:75]  Filed Weights   03/05/2017 0230 02/11/2017 0500 Mar 02, 2017 0400  Weight: 97.8 kg (215 lb 9.8 oz) 97.8 kg (215 lb 9.8 oz) 99.6 kg (219 lb 9.3 oz)    Recent Labs  Lab 02/27/2017 0226 02/07/2017 0535 02/27/2017 1638 03-02-17 0353  NA 132* 127* 130* 131*  K 4.7 4.2 3.5 3.6  CL 90*  --  93* 97*  CO2 9*  --  16* 15*  GLUCOSE 415*  --  406* 220*  BUN 42*  --  29* 19  CREATININE 3.55*  --  2.34* 1.72*  CALCIUM 8.6*  --  7.5* 7.3*  PHOS 7.2*  --  3.0 2.2*   Recent Labs  Lab 02/20/17 0854 02/21/17 0848 03/04/2017 0226 02/28/2017 0535 02/11/2017 1638  WBC 5.2 6.6 8.6  --   --   HGB 8.9* 9.1* 9.7* 12.2  --   HCT 30.8* 31.3* 33.7* 36.0  --   MCV 95.4 94.3 96.8  --   --   PLT 96* 105* 121*  --  90*    Scheduled Meds: . chlorhexidine gluconate (MEDLINE KIT)  15 mL Mouth Rinse BID  . fludrocortisone  0.05 mg Oral Q24H  . hydrocortisone sod succinate (SOLU-CORTEF) inj  50 mg Intravenous Q6H  . insulin aspart  0-20 Units Subcutaneous Q4H  . mouth rinse  15 mL Mouth Rinse QID  . pantoprazole (PROTONIX) IV  40 mg Intravenous Q24H   Continuous Infusions: . sodium chloride 250 mL (02/12/2017 2019)  . DOPamine 13 mcg/kg/min (02-Mar-2017 0700)  . epinephrine 7 mcg/min (03-02-2017 0700)  . norepinephrine (LEVOPHED) Adult infusion 36 mcg/min (03/02/17 0700)  . piperacillin-tazobactam Stopped (Mar 02, 2017 0618)  . dialysis replacement fluid (prismasate) 1,000 mL/hr at March 02, 2017  0619  . dialysis replacement fluid (prismasate) 500 mL/hr at 03/02/2017 0619  . dialysate (PRISMASATE) 1,500 mL/hr at 02-Mar-2017 0619  .  sodium bicarbonate (isotonic) infusion in sterile water    . sodium chloride    . vancomycin Stopped (02/08/2017 1858)  . vasopressin (PITRESSIN) infusion - *FOR SHOCK* 0.03 Units/min (March 02, 2017 0700)   PRN Meds:.sodium chloride, albuterol, fentaNYL (SUBLIMAZE) injection, fentaNYL (SUBLIMAZE) injection, heparin, sodium chloride  ABG    Component Value Date/Time   PHART 7.139 (LL) 02/18/2017 0535   PCO2ART 19.9 (LL) 02/05/2017 0535   PO2ART 91.0 02/20/2017 0535   HCO3 6.9 (L) 03/03/2017 0535   TCO2 8 (L) 03/01/2017 0535   ACIDBASEDEF 21.0 (H) 02/16/2017 0535   O2SAT 95.0 03/06/2017 0535    A/P  1. ESRD [facility] via [facility], maturing AVG 2. Septic Shock, req 4 pressors, source unclear 3. Acute systolic CHF exacerbation 4. VDRF, hypoxic 40% FIO2 now 5. AG metabolic acidosis, lacate elevated 6. Supratherapeutic INR, > 10 7. Failure to Thrive, recent 10d admission, now in SNF< with decubitus ulcers 8. DM2   1. Cont CRRT, inc DFR and PBP to help with acidosis, no heparin in circuit, net even 2. Stop NaHCO3 gtt 3. BID labs 4. Monitor phos  Pearson Grippe, MD [facility] pgr [phone]

## 2017-03-07 NOTE — Plan of Care (Addendum)
  Interdisciplinary Goals of Care Family Meeting   Date carried out:: 03-12-17  Location of the meeting: Conference room  Member's involved: Physician, Bedside Registered Nurse and Family Member or next of kin  Durable Power of Attorney or acting medical decision maker: Daughter and extended family  Discussion: We discussed goals of care for [employer] .  Patient is not doing well. She is maxed out on 4 pressors including epinephrine drip and unable to tolerate CVVH.  Her body is shutting down and she is having necrotic toes and extremities.  I told family that prognosis is extremely poor and she may pass away today.  We have decided to stop CVVH and will to perform CPR or code her in case of cardiac arrest.  We will continue pressors, vent for now. If the family is ready to withdraw care then we will proceed with terminal extubation.   Code status: Limited Code or DNR with short term  Disposition: Continue current acute care  Time spent for the meeting: 25 mins  Soliyana Mcchristian Mar 12, 2017, 4:03 PM

## 2017-03-07 DEATH — deceased

## 2017-03-10 ENCOUNTER — Telehealth: Payer: Self-pay

## 2017-03-10 NOTE — Telephone Encounter (Signed)
On 03/10/17 I received a d/c from [facility] (original). The d/c is for burial. The patient is a patient of Doctor Mannam. The d/c will be taken to [employer] ([place]) for signature.  On 03/14/17 I received the d/c back from Doctor Mannam. I got the d/c ready and mailed the d/c to vital records per the funeral home request.

## 2017-04-07 NOTE — Discharge Summary (Signed)
Physician Discharge Summary  Patient ID: [name] MRN: [id number] DOB/AGE: Dec 22, 1945 72 y.o.  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     Recently admitted 12/7-12/18 with weakness. INR supratherapeutic. Coumadin d/c started on eliquis  Amiodarone D/C due to elevated LFTs. Discharged to SNF.   Presented to [facility] on 12/21 with hypotension, hypoxia, and AMS. Glucose 30.  HR 58N, Systolic 27P. Started on Dopamine gtt. CXR with bilateral pleural effusions. ABG 7.23/26.6/117, LA 11.7. Per Family patient went to HD 12/20 however they were unable to pull fluid off due to hypotension.Transferred to [facility] for [facility] management.   Upon arrival to ICU patient on BiPAP however shortly after required intubation, CVC and A-line placement. She placed on CVVH but continued to deteriorate. Family meeting was held and discussed goals of care for. She is maxed out on 4 pressors including epinephrine drip and needed to be taken of CVVH as BP could not tolerate.  I told family that prognosis is extremely poor and she may pass away today.  family decided to make DNR and withdraw care. She passed away after stopping pressors.    Significant Diagnostic Studies, events:  Echo 4/22> EF 20-25%, restrictive physiology.  PA peak pressure of 54.  CULTURES: Blood 12/22 >>  Sputum 12/22 >>    ANTIBIOTICS: Vancomycin 12/22 >> Zosyn 12/22 >>   LINES/TUBES: ETT 12/22 >> Left Femoral Aline 12/22 >> Right Femoral Aline 12/22 >> Perm-Cath Right Subclavian >>   Disposition: 20-Expired   Allergies as of 2017/03/01      Reactions   Ace Inhibitors Cough   Amlodipine Swelling   Became swollen "all over"   Codeine Rash      Medication List    ASK your doctor about these medications   albuterol 108 (90 Base) MCG/ACT inhaler Commonly known as:  PROVENTIL HFA;VENTOLIN HFA Inhale 1-2 puffs into the lungs every 6 (six) hours as needed for wheezing or shortness of breath.   albuterol (2.5 MG/3ML) 0.083% nebulizer solution Commonly known as:  PROVENTIL Take 3 mLs (2.5 mg total) by nebulization every 6 (six) hours as needed for wheezing or shortness of breath.   ALPRAZolam 0.5 MG tablet Commonly known as:  XANAX Take 1 tablet (0.5 mg total) by mouth at bedtime.   amiodarone 200 MG tablet Commonly known as:  PACERONE Take 200 mg by mouth daily.   apixaban 2.5 MG Tabs tablet Commonly known as:  ELIQUIS Take 1 tablet (2.5 mg total) by mouth 2 (two) times daily.   calcitRIOL 0.5 MCG capsule Commonly known as:  ROCALTROL Take 1 capsule (0.5 mcg total) daily by mouth.   calcium acetate 667 MG capsule Commonly known as:  PHOSLO Take 1 capsule (667 mg total) by mouth 3 (three) times daily with meals.   cetirizine 10 MG tablet Commonly known as:  ZYRTEC Take 5 mg by mouth daily.   clopidogrel 75 MG tablet Commonly known as:  PLAVIX Take 1 tablet (75 mg total)  by mouth daily.   cyclobenzaprine 5 MG tablet Commonly known as:  FLEXERIL Take 5 mg by mouth as needed (lower back pain). osteoporosis with out current pathological fracture for 14 days   ferrous sulfate 325 (65 FE) MG tablet Take 325 mg by mouth daily with breakfast.   fluticasone 50 MCG/ACT nasal spray Commonly known as:  FLONASE Place 1 spray into both nostrils daily.   HUMALOG KWIKPEN 100 UNIT/ML  KiwkPen Generic drug:  insulin lispro Inject 15-21 Units into the skin 3 (three) times daily.   insulin glargine 100 UNIT/ML injection Commonly known as:  LANTUS Inject into the skin at bedtime.   isosorbide mononitrate 30 MG 24 hr tablet Commonly known as:  IMDUR Take 1 tablet (30 mg total) 2 (two) times daily by mouth.   latanoprost 0.005 % ophthalmic solution Commonly known as:  XALATAN Place 1 drop into both eyes at bedtime.   meclizine 25 MG tablet Commonly known as:  ANTIVERT Take 12.5 mg by mouth 2 (two) times daily.   metoprolol succinate 25 MG 24 hr tablet Commonly known as:  TOPROL-XL Take 25 mg by mouth daily.   nitroGLYCERIN 0.4 MG SL tablet Commonly known as:  NITROSTAT Place 1 tablet (0.4 mg total) under the tongue every 5 (five) minutes as needed for chest pain.   pantoprazole 40 MG tablet Commonly known as:  PROTONIX Take 40 mg by mouth daily.   rosuvastatin 40 MG tablet Commonly known as:  CRESTOR Take 40 mg by mouth daily at 6 PM.       Signed: Jodell Weitman 03/14/2017, 4:59 PM

## 2018-07-25 ENCOUNTER — Other Ambulatory Visit: Payer: Self-pay | Admitting: *Deleted

## 2018-12-29 IMAGING — DX DG CHEST 2V
2 series · 2 of 2 positions shown · non-contrast
Comparison: 08/07/2016

CLINICAL DATA: Chest pain and shortness of breath.  Cough.

EXAM:
CHEST  2 VIEW

[chest lat]
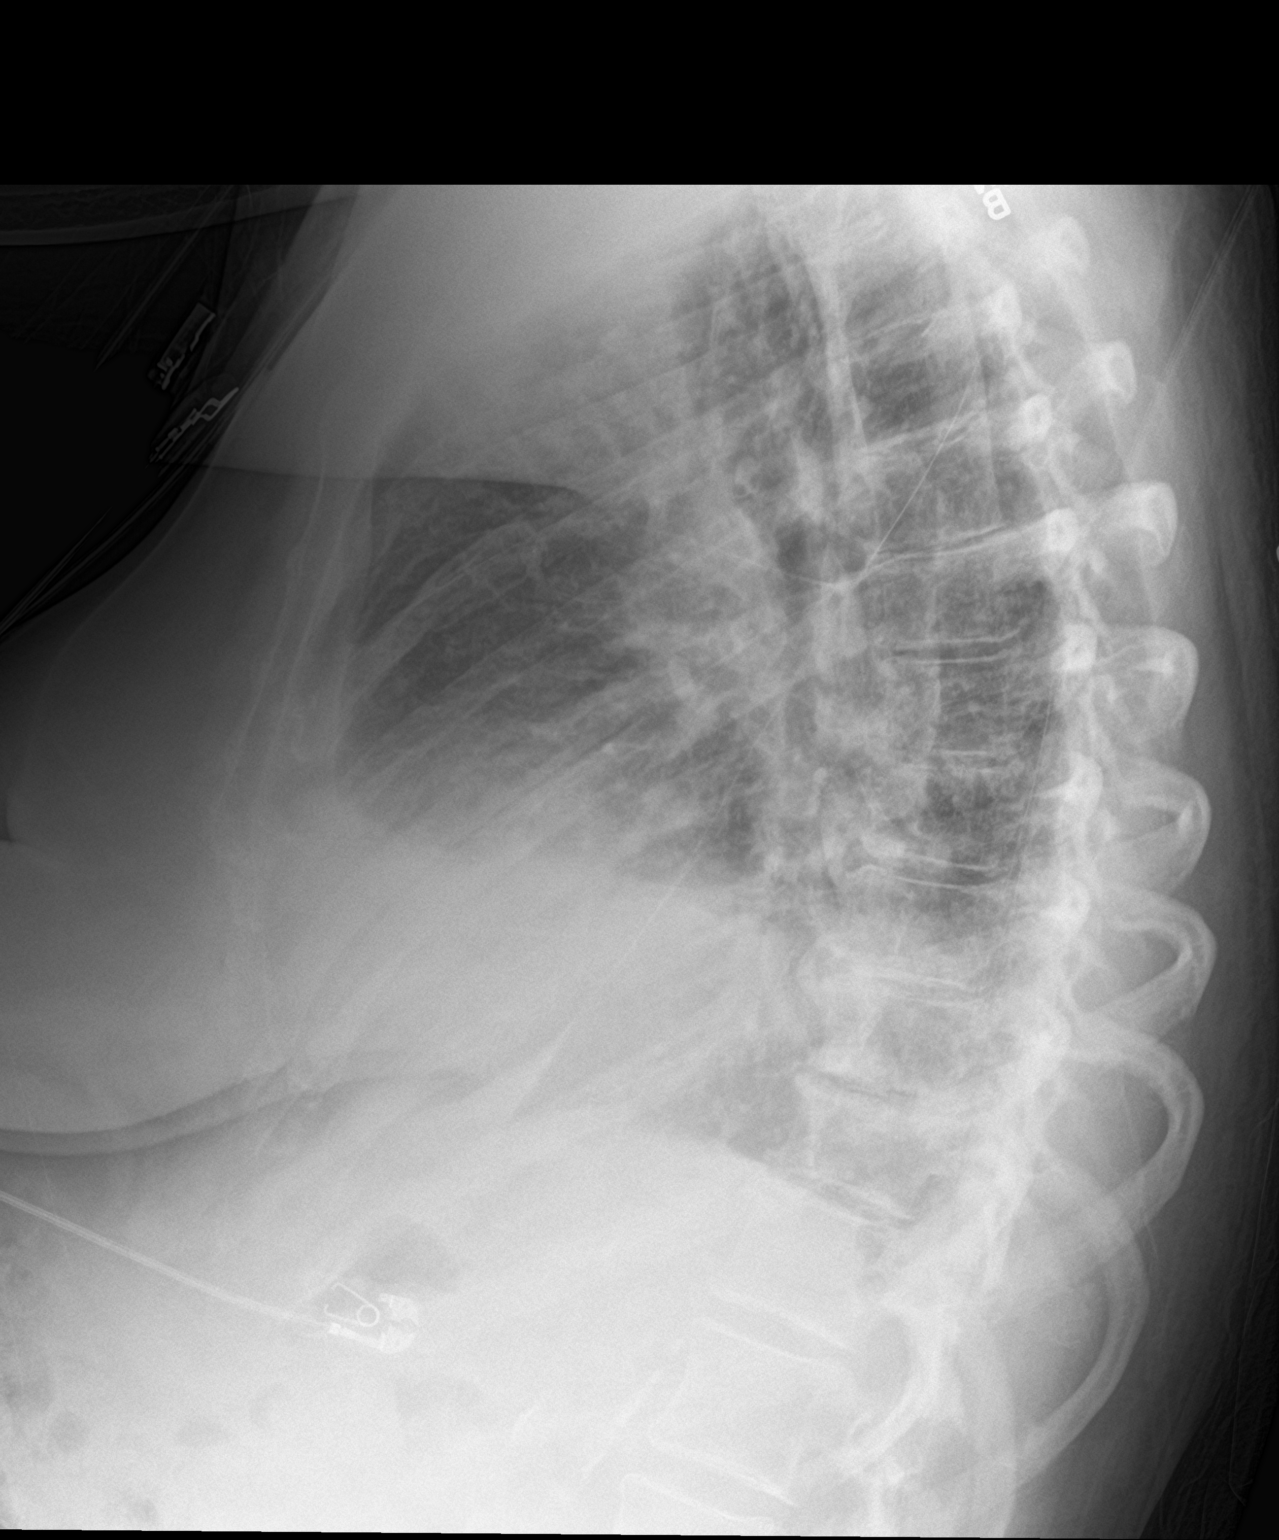

[chest ap]
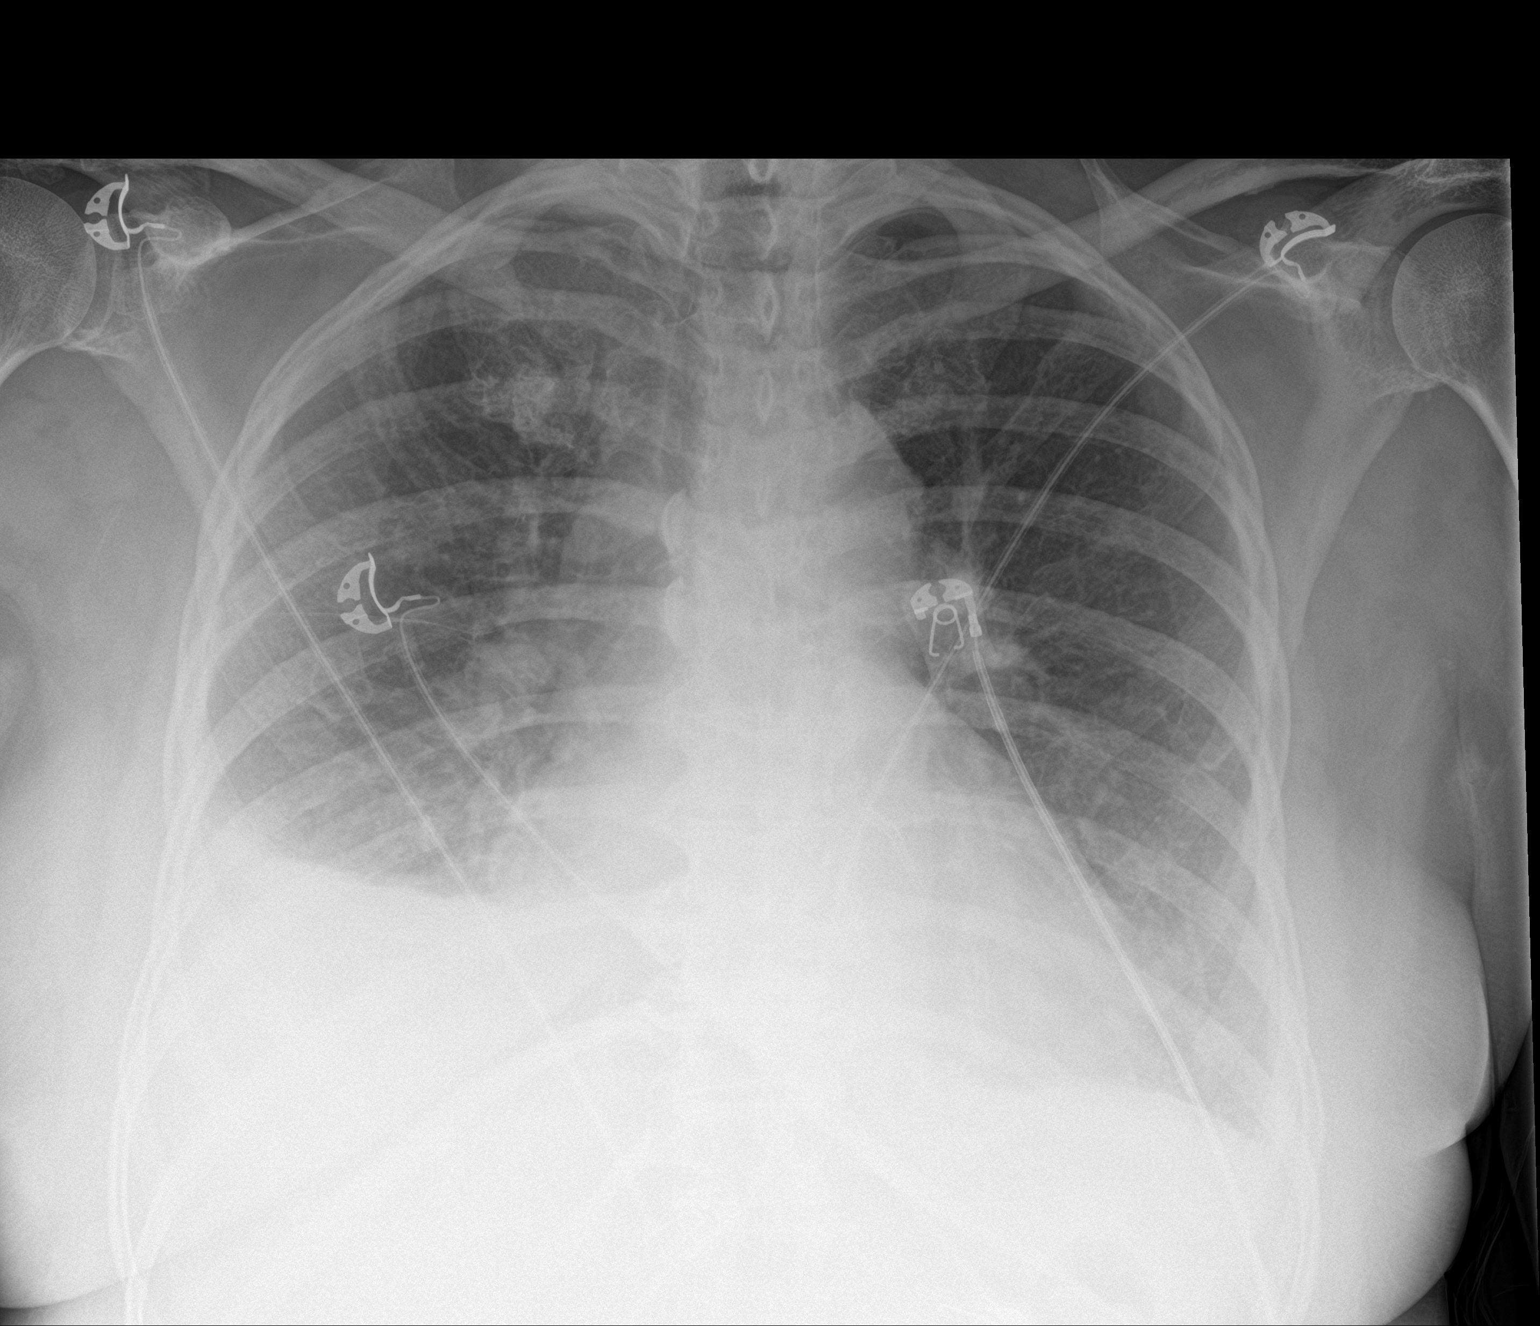

[2 of 2 positions shown; findings below may reference images not displayed]

FINDINGS: There is cardiomegaly. Bilateral pleural effusions, moderate on the
right and small on the left. There is vascular congestion and
peribronchial cuffing suggesting pulmonary edema. No pneumothorax.
IMPRESSION: Cardiomegaly with bilateral pleural effusions and vascular
congestion. Peribronchial cuffing may reflect pulmonary edema or
bronchial inflammation.

## 2018-12-29 IMAGING — DX DG CHEST 1V
1 series · 1 of 1 positions shown · non-contrast
Comparison: 10/31/2016.

CLINICAL DATA: Status post right thoracentesis.

EXAM:
CHEST 1 VIEW

[chest pa]
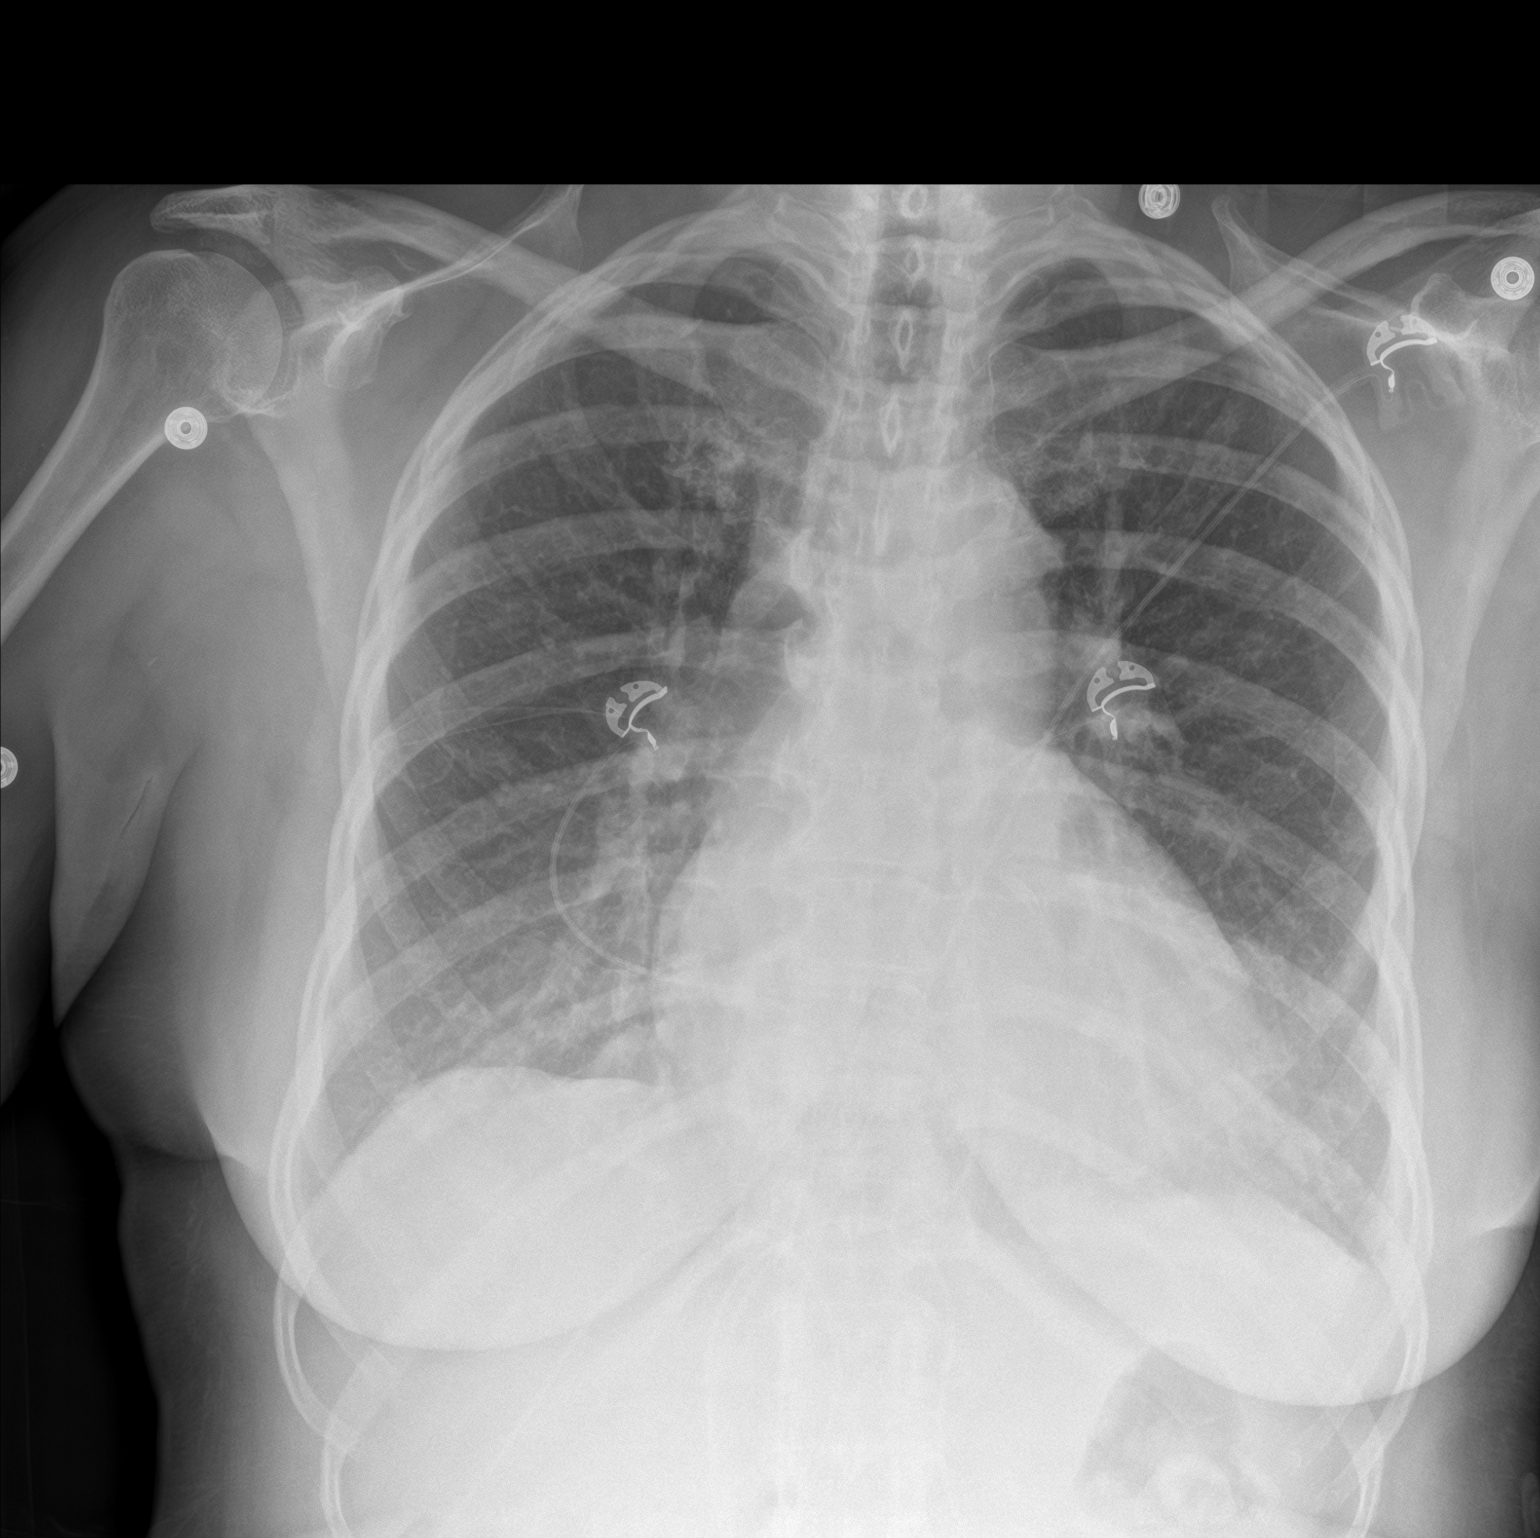

[1 of 1 positions shown; findings below may reference images not displayed]

FINDINGS: Stable enlarged cardiac silhouette. Minimal left lower lobe
atelectasis with improvement. Minimal bilateral pleural fluid,
mildly improved on the left and significantly improved on the right
following thoracentesis. No pneumothorax. Thoracic spine
degenerative changes.
IMPRESSION: 1. Almost completely resolved right pleural fluid following
thoracentesis without pneumothorax.
2. Small left pleural effusion with improvement with decreased left
basilar atelectasis.

## 2019-01-19 IMAGING — DX DG CHEST 1V
1 series · 1 of 1 positions shown · non-contrast
Comparison: 11/20/2016

CLINICAL DATA: Pleural effusions, right greater than left.

EXAM:
CHEST 1 VIEW

[chest ap]
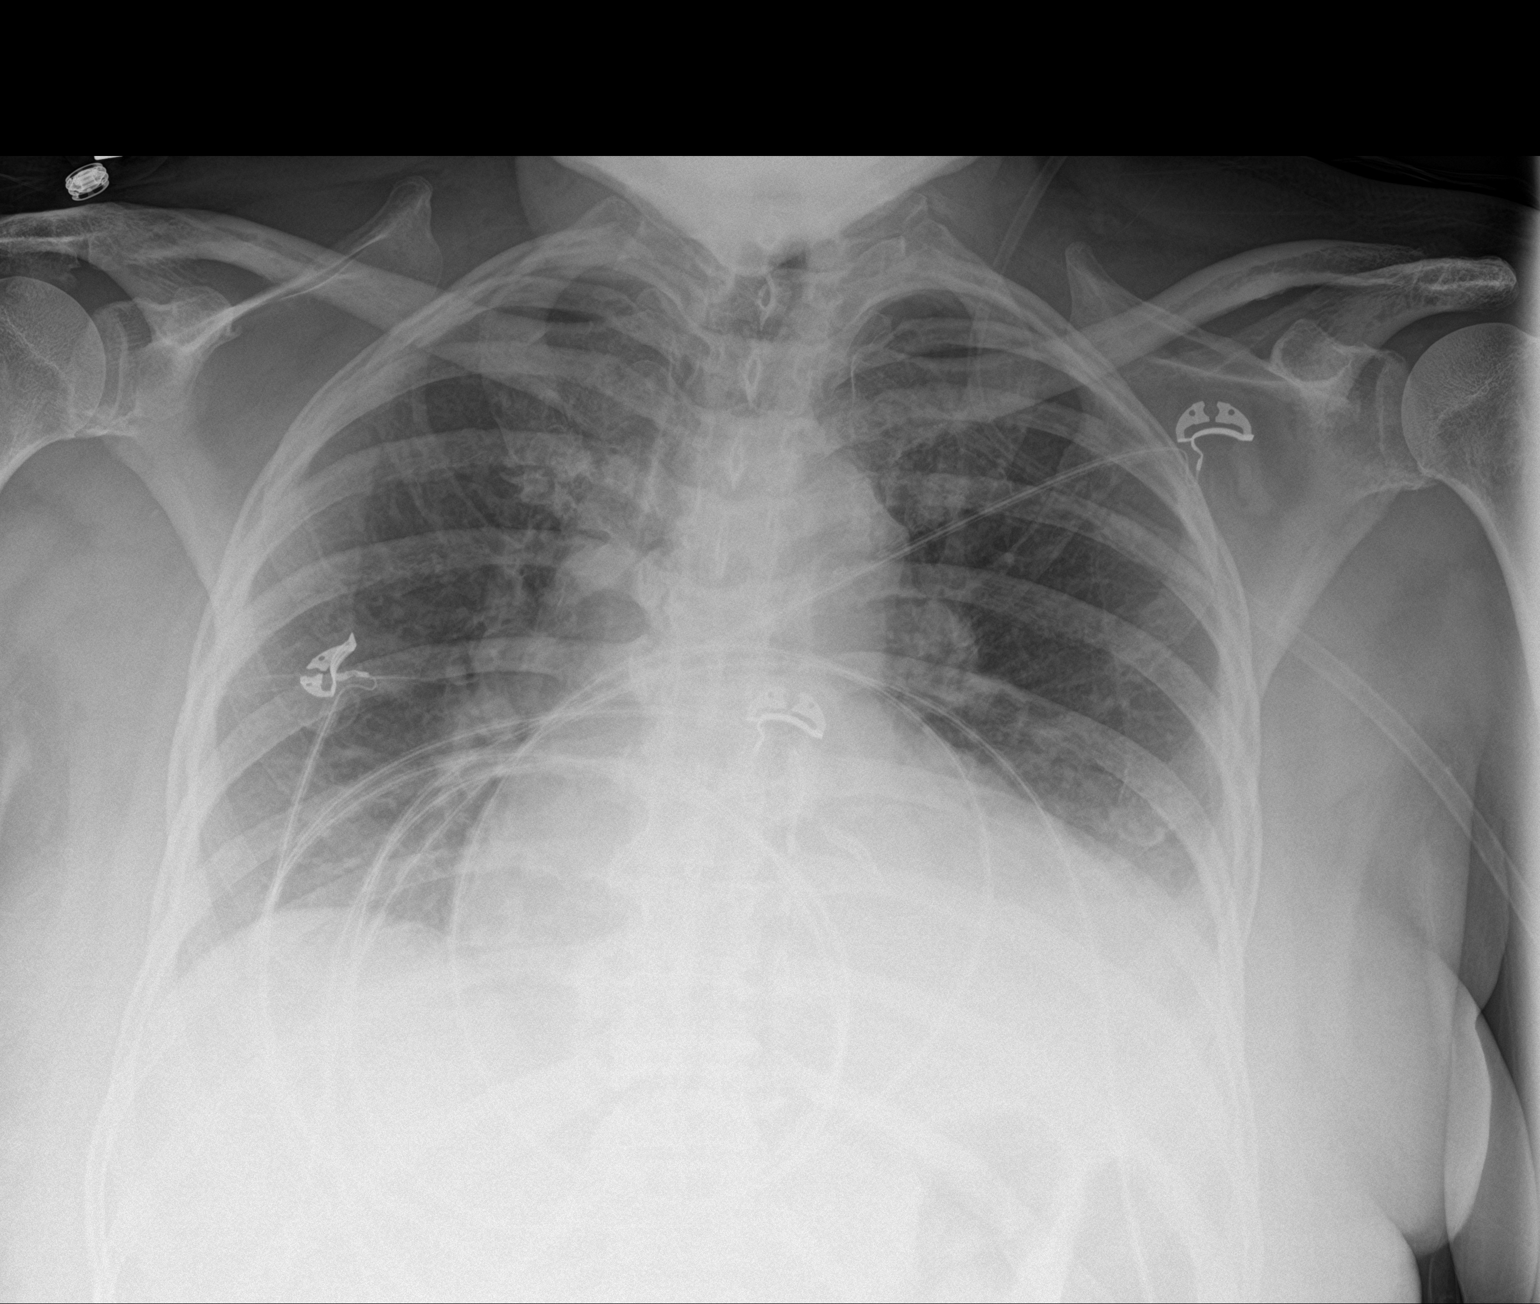

[1 of 1 positions shown; findings below may reference images not displayed]

FINDINGS: Right pleural effusion has been eliminated. There is a moderate left
effusion which appears slightly more prominent than on the prior
study. Overall heart size and pulmonary vascularity are normal.

No pneumothorax after thoracentesis.
IMPRESSION: No pneumothorax after right thoracentesis. No visible residual right
pleural fluid. Moderate left effusion appears slightly increased.

## 2019-09-11 IMAGING — US US THORACENTESIS ASP PLEURAL SPACE W/IMG GUIDE
1 series · 1 of 1 positions shown · non-contrast
Comparison: none

INDICATION: Right pleural effusion.

[Series 1: us thoracentesis asp pleural space w/img guide · 0.33mm/px · 1 of 1 slices shown]
[im 1/1]
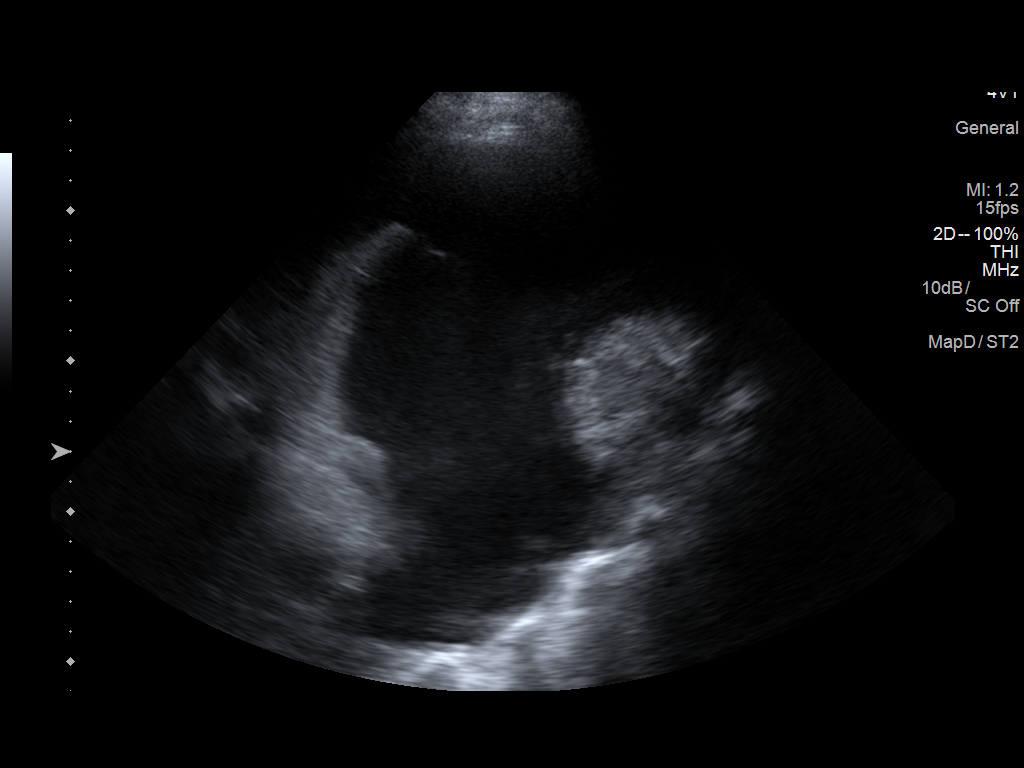

[1 of 1 positions shown; findings below may reference images not displayed]

EXAM:
ULTRASOUND GUIDED RIGHT THORACENTESIS

MEDICATIONS:
None.

COMPLICATIONS:
None immediate.

PROCEDURE:
An ultrasound guided thoracentesis was thoroughly discussed with the
patient and questions answered. The benefits, risks, alternatives
and complications were also discussed. The patient understands and
wishes to proceed with the procedure. Written consent was obtained.

Ultrasound was performed to localize and mark an adequate pocket of
fluid in the RIGHT chest. The area was then prepped and draped in
the normal sterile fashion. 1% Lidocaine was used for local
anesthesia. Under ultrasound guidance a Yueh catheter was
introduced. Thoracentesis was performed. The catheter was removed
and a dressing applied.
FINDINGS: A total of approximately 1.7 L of dark blood-tinged fluid was
removed. Samples were sent to the laboratory as requested by the
clinical team.
IMPRESSION: Successful ultrasound guided right thoracentesis yielding 1.7 L of
pleural fluid.
